# Patient Record
Sex: Male | Born: 1990
Health system: Southern US, Community
[De-identification: ages and names within clinical notes are randomized; demographics above are authoritative.]

## PROBLEM LIST (undated history)

## (undated) DIAGNOSIS — K7460 Unspecified cirrhosis of liver: Secondary | ICD-10-CM

## (undated) DIAGNOSIS — R188 Other ascites: Secondary | ICD-10-CM

## (undated) DIAGNOSIS — R159 Full incontinence of feces: Secondary | ICD-10-CM

## (undated) DIAGNOSIS — B180 Chronic viral hepatitis B with delta-agent: Secondary | ICD-10-CM

## (undated) DIAGNOSIS — K7010 Alcoholic hepatitis without ascites: Secondary | ICD-10-CM

## (undated) DIAGNOSIS — I69311 Memory deficit following cerebral infarction: Secondary | ICD-10-CM

## (undated) DIAGNOSIS — K7290 Hepatic failure, unspecified without coma: Secondary | ICD-10-CM

## (undated) DIAGNOSIS — F329 Major depressive disorder, single episode, unspecified: Secondary | ICD-10-CM

## (undated) DIAGNOSIS — J069 Acute upper respiratory infection, unspecified: Secondary | ICD-10-CM

## (undated) DIAGNOSIS — Z982 Presence of cerebrospinal fluid drainage device: Secondary | ICD-10-CM

## (undated) DIAGNOSIS — N319 Neuromuscular dysfunction of bladder, unspecified: Secondary | ICD-10-CM

## (undated) DIAGNOSIS — R41842 Visuospatial deficit: Secondary | ICD-10-CM

## (undated) DIAGNOSIS — Z978 Presence of other specified devices: Secondary | ICD-10-CM

## (undated) DIAGNOSIS — Z8619 Personal history of other infectious and parasitic diseases: Secondary | ICD-10-CM

## (undated) DIAGNOSIS — G40909 Epilepsy, unspecified, not intractable, without status epilepticus: Secondary | ICD-10-CM

## (undated) DIAGNOSIS — K592 Neurogenic bowel, not elsewhere classified: Secondary | ICD-10-CM

## (undated) DIAGNOSIS — I69314 Frontal lobe and executive function deficit following cerebral infarction: Secondary | ICD-10-CM

## (undated) DIAGNOSIS — L989 Disorder of the skin and subcutaneous tissue, unspecified: Secondary | ICD-10-CM

## (undated) DIAGNOSIS — B451 Cerebral cryptococcosis: Secondary | ICD-10-CM

## (undated) DIAGNOSIS — B182 Chronic viral hepatitis C: Secondary | ICD-10-CM

## (undated) DIAGNOSIS — Z8673 Personal history of transient ischemic attack (TIA), and cerebral infarction without residual deficits: Secondary | ICD-10-CM

## (undated) DIAGNOSIS — N179 Acute kidney failure, unspecified: Secondary | ICD-10-CM

## (undated) DIAGNOSIS — F32A Depression, unspecified: Secondary | ICD-10-CM

## (undated) DIAGNOSIS — Z960 Presence of urogenital implants: Secondary | ICD-10-CM

## (undated) DIAGNOSIS — G709 Myoneural disorder, unspecified: Secondary | ICD-10-CM

## (undated) DIAGNOSIS — R41841 Cognitive communication deficit: Secondary | ICD-10-CM

## (undated) DIAGNOSIS — R112 Nausea with vomiting, unspecified: Secondary | ICD-10-CM

## (undated) DIAGNOSIS — I6931 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: Secondary | ICD-10-CM

## (undated) DIAGNOSIS — F319 Bipolar disorder, unspecified: Secondary | ICD-10-CM

## (undated) DIAGNOSIS — R4689 Other symptoms and signs involving appearance and behavior: Secondary | ICD-10-CM

## (undated) DIAGNOSIS — B20 Human immunodeficiency virus [HIV] disease: Secondary | ICD-10-CM

## (undated) DIAGNOSIS — F419 Anxiety disorder, unspecified: Secondary | ICD-10-CM

## (undated) DIAGNOSIS — G40901 Epilepsy, unspecified, not intractable, with status epilepticus: Secondary | ICD-10-CM

## (undated) HISTORY — DX: Major depressive disorder, single episode, unspecified: F32.9

## (undated) HISTORY — DX: Other ascites: R18.8

## (undated) HISTORY — DX: Anxiety disorder, unspecified: F41.9

## (undated) HISTORY — PX: INCISION AND DRAINAGE PERIRECTAL ABSCESS: SHX1804

## (undated) HISTORY — DX: Full incontinence of feces: R15.9

## (undated) HISTORY — DX: Alcoholic hepatitis without ascites: K70.10

## (undated) HISTORY — DX: Acute upper respiratory infection, unspecified: J06.9

## (undated) HISTORY — DX: Depression, unspecified: F32.A

## (undated) HISTORY — DX: Other ascites: K74.60

## (undated) HISTORY — DX: Nausea with vomiting, unspecified: R11.2

## (undated) HISTORY — DX: Myoneural disorder, unspecified: G70.9

## (undated) HISTORY — DX: Chronic viral hepatitis C: B18.2

---
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
## 2010-05-23 ENCOUNTER — Emergency Department (HOSPITAL_COMMUNITY): Admission: EM | Admit: 2010-05-23 | Discharge: 2010-05-24 | Payer: Self-pay | Admitting: Emergency Medicine

## 2010-08-27 ENCOUNTER — Inpatient Hospital Stay (HOSPITAL_COMMUNITY): Admission: EM | Admit: 2010-08-27 | Discharge: 2010-08-29 | Payer: Self-pay | Admitting: Emergency Medicine

## 2011-03-09 LAB — CBC
HCT: 35.3 % — ABNORMAL LOW (ref 39.0–52.0)
Hemoglobin: 11.9 g/dL — ABNORMAL LOW (ref 13.0–17.0)
MCH: 29.5 pg (ref 26.0–34.0)
MCHC: 33.7 g/dL (ref 30.0–36.0)
MCV: 87.4 fL (ref 78.0–100.0)
Platelets: 318 10*3/uL (ref 150–400)
RBC: 4.04 MIL/uL — ABNORMAL LOW (ref 4.22–5.81)
RDW: 12.4 % (ref 11.5–15.5)
WBC: 9.1 10*3/uL (ref 4.0–10.5)

## 2011-03-09 LAB — URINALYSIS, ROUTINE W REFLEX MICROSCOPIC
Bilirubin Urine: NEGATIVE
Glucose, UA: NEGATIVE mg/dL
Hgb urine dipstick: NEGATIVE
Ketones, ur: NEGATIVE mg/dL
Nitrite: NEGATIVE
Protein, ur: NEGATIVE mg/dL
Specific Gravity, Urine: 1.015 (ref 1.005–1.030)
Urobilinogen, UA: 2 mg/dL — ABNORMAL HIGH (ref 0.0–1.0)
pH: 6.5 (ref 5.0–8.0)

## 2011-03-09 LAB — DIFFERENTIAL
Basophils Absolute: 0 10*3/uL (ref 0.0–0.1)
Basophils Relative: 0 % (ref 0–1)
Eosinophils Absolute: 0.1 10*3/uL (ref 0.0–0.7)
Eosinophils Relative: 2 % (ref 0–5)
Lymphocytes Relative: 29 % (ref 12–46)
Lymphs Abs: 2.7 10*3/uL (ref 0.7–4.0)
Monocytes Absolute: 0.6 10*3/uL (ref 0.1–1.0)
Monocytes Relative: 7 % (ref 3–12)
Neutro Abs: 5.6 10*3/uL (ref 1.7–7.7)
Neutrophils Relative %: 62 % (ref 43–77)

## 2011-03-09 LAB — POCT I-STAT, CHEM 8
BUN: 6 mg/dL (ref 6–23)
Calcium, Ion: 1.19 mmol/L (ref 1.12–1.32)
Chloride: 101 mEq/L (ref 96–112)
Creatinine, Ser: 0.9 mg/dL (ref 0.4–1.5)
Glucose, Bld: 97 mg/dL (ref 70–99)
HCT: 39 % (ref 39.0–52.0)
Hemoglobin: 13.3 g/dL (ref 13.0–17.0)
Potassium: 3.3 mEq/L — ABNORMAL LOW (ref 3.5–5.1)
Sodium: 140 mEq/L (ref 135–145)
TCO2: 29 mmol/L (ref 0–100)

## 2011-03-09 LAB — ANAEROBIC CULTURE

## 2011-03-09 LAB — CULTURE, ROUTINE-ABSCESS: Culture: NORMAL

## 2011-03-09 LAB — HEMOCCULT GUIAC POC 1CARD (OFFICE): Fecal Occult Bld: NEGATIVE

## 2011-03-13 LAB — RAPID STREP SCREEN (MED CTR MEBANE ONLY): Streptococcus, Group A Screen (Direct): NEGATIVE

## 2011-03-29 LAB — RPR TITER: RPR Titer: 1:64 {titer} — AB

## 2011-03-29 LAB — T.PALLIDUM AB, IGG: T pallidum Antibodies (TP-PA): 20.6 IV — ABNORMAL HIGH (ref <1.0)

## 2011-03-29 LAB — GC/CHLAMYDIA PROBE AMP, GENITAL
Chlamydia, DNA Probe: NEGATIVE
GC Probe Amp, Genital: NEGATIVE

## 2011-03-29 LAB — RPR: RPR Ser Ql: REACTIVE — AB

## 2011-04-03 LAB — CBC
HCT: 43.4 % (ref 39.0–52.0)
Hemoglobin: 14.9 g/dL (ref 13.0–17.0)
MCHC: 34.5 g/dL (ref 30.0–36.0)
MCV: 87.8 fL (ref 78.0–100.0)
Platelets: 167 10*3/uL (ref 150–400)
RBC: 4.94 MIL/uL (ref 4.22–5.81)
RDW: 12.1 % (ref 11.5–15.5)
WBC: 2.9 10*3/uL — ABNORMAL LOW (ref 4.0–10.5)

## 2011-04-03 LAB — COMPREHENSIVE METABOLIC PANEL
ALT: 55 U/L — ABNORMAL HIGH (ref 0–53)
AST: 59 U/L — ABNORMAL HIGH (ref 0–37)
Albumin: 3.4 g/dL — ABNORMAL LOW (ref 3.5–5.2)
Alkaline Phosphatase: 68 U/L (ref 39–117)
BUN: 8 mg/dL (ref 6–23)
CO2: 26 mEq/L (ref 19–32)
Calcium: 8.8 mg/dL (ref 8.4–10.5)
Chloride: 103 mEq/L (ref 96–112)
Creatinine, Ser: 0.99 mg/dL (ref 0.4–1.5)
GFR calc Af Amer: >60 mL/min (ref >60)
GFR calc non Af Amer: >60 mL/min (ref >60)
Glucose, Bld: 89 mg/dL (ref 70–99)
Potassium: 3.1 mEq/L — ABNORMAL LOW (ref 3.5–5.1)
Sodium: 137 mEq/L (ref 135–145)
Total Bilirubin: 0.8 mg/dL (ref 0.3–1.2)
Total Protein: 7.3 g/dL (ref 6.0–8.3)

## 2011-04-03 LAB — DIFFERENTIAL
Basophils Absolute: 0 10*3/uL (ref 0.0–0.1)
Basophils Relative: 1 % (ref 0–1)
Eosinophils Absolute: 0 10*3/uL (ref 0.0–0.7)
Eosinophils Relative: 0 % (ref 0–5)
Lymphocytes Relative: 41 % (ref 12–46)
Lymphs Abs: 1.2 10*3/uL (ref 0.7–4.0)
Monocytes Absolute: 0.4 10*3/uL (ref 0.1–1.0)
Monocytes Relative: 13 % — ABNORMAL HIGH (ref 3–12)
Neutro Abs: 1.3 10*3/uL — ABNORMAL LOW (ref 1.7–7.7)
Neutrophils Relative %: 45 % (ref 43–77)

## 2011-04-03 LAB — URINALYSIS, ROUTINE W REFLEX MICROSCOPIC
Bilirubin Urine: NEGATIVE
Glucose, UA: NEGATIVE mg/dL
Hgb urine dipstick: NEGATIVE
Ketones, ur: NEGATIVE mg/dL
Nitrite: NEGATIVE
Protein, ur: NEGATIVE mg/dL
Specific Gravity, Urine: 1.017 (ref 1.005–1.030)
Urobilinogen, UA: 4 mg/dL — ABNORMAL HIGH (ref 0.0–1.0)
pH: 7 (ref 5.0–8.0)

## 2011-09-18 LAB — INFLUENZA A AND B ANTIGEN (CONVERTED LAB)
Inflenza A Ag: NEGATIVE
Influenza B Ag: NEGATIVE

## 2011-11-25 ENCOUNTER — Emergency Department (HOSPITAL_COMMUNITY)
Admission: EM | Admit: 2011-11-25 | Discharge: 2011-11-26 | Disposition: A | Discharge status: Home / Self Care | Payer: Self-pay | Attending: Emergency Medicine | Admitting: Emergency Medicine

## 2011-11-25 ENCOUNTER — Encounter: Payer: Self-pay | Admitting: Emergency Medicine

## 2011-11-25 DIAGNOSIS — K921 Melena: Secondary | ICD-10-CM | POA: Insufficient documentation

## 2011-11-25 DIAGNOSIS — R198 Other specified symptoms and signs involving the digestive system and abdomen: Secondary | ICD-10-CM | POA: Insufficient documentation

## 2011-11-25 DIAGNOSIS — K6289 Other specified diseases of anus and rectum: Secondary | ICD-10-CM | POA: Insufficient documentation

## 2011-11-25 MED ORDER — CEFTRIAXONE SODIUM 250 MG IJ SOLR
250.0000 mg | Freq: Once | INTRAMUSCULAR | Status: AC
  Administered 2011-11-26: 250 mg via INTRAMUSCULAR
  Filled 2011-11-25: qty 250

## 2011-11-25 MED ORDER — AZITHROMYCIN 250 MG PO TABS
1000.0000 mg | ORAL_TABLET | Freq: Once | ORAL | Status: AC
  Administered 2011-11-26: 1000 mg via ORAL
  Filled 2011-11-25: qty 4

## 2011-11-25 NOTE — ED Notes (Signed)
Per pt has Hx of rectal abscess, last year, ever since then noted white stains on the underwear, blood, at time moderate amount, and stool. Denies pain states is more discomfort.

## 2011-11-25 NOTE — ED Provider Notes (Signed)
History     CSN: [id number] Arrival date & time: 11/25/2011  8:09 PM   None     Chief Complaint  Patient presents with  . Rectal Problems    Hx of rectal abscess    (Consider location/radiation/quality/duration/timing/severity/associated sxs/prior treatment) Patient is a 20 y.o. male presenting with hematochezia. The history is provided by the patient.  Rectal Bleeding  The problem occurs frequently. The problem has been unchanged (He reports drainage of perirectal abscess over one year ago. A short time after that he started having recurrent purulent drainage without swelling or significant pain. He sees some bleeding as well. ). The pain is mild. The stool is described as soft. Pertinent negatives include no fever, no abdominal pain and no nausea. Associated symptoms comments: He denies any change in symptoms to prompt visit, only that symptoms persist and he wanted evaluation. He reports regular, easy bowel movements..    History reviewed. No pertinent past medical history.  Past Surgical History  Procedure Date  . Rectal surgery     drainage of abscess    History reviewed. No pertinent family history.  History  Substance Use Topics  . Smoking status: Current Everyday Smoker  . Smokeless tobacco: Not on file  . Alcohol Use: No      Review of Systems  Constitutional: Negative for fever and chills.  HENT: Negative.   Gastrointestinal: Positive for hematochezia. Negative for nausea and abdominal pain.       See HPI.  Skin: Negative.   Neurological: Negative.     Allergies  Review of patient's allergies indicates no known allergies.  Home Medications   Current Outpatient Rx  Name Route Sig Dispense Refill  . PSEUDOEPH-DOXYLAMINE-DM-APAP 60-7.05-23-999 MG/30ML PO LIQD Oral Take 10 mLs by mouth as needed. cough       BP 112/63  Pulse 101  Temp(Src) 98.7 F (37.1 C) (Oral)  Resp 18  SpO2 100%  Physical Exam  Constitutional: He is oriented to person,  place, and time. He appears well-developed and well-nourished.  Neck: Normal range of motion.  Pulmonary/Chest: Effort normal.  Genitourinary:       Purulent discharge around anus. No palpable abscess perirectally. No hemorrhoids observed. Anus is tender. No bleeding.  Musculoskeletal: Normal range of motion.  Neurological: He is alert and oriented to person, place, and time.  Skin: Skin is warm and dry.  Psychiatric: He has a normal mood and affect.    ED Course  Procedures (including critical care time)  Labs Reviewed - No data to display No results found.   No diagnosis found.    MDM          Rodena Medin, PA 12/04/11 [phone]

## 2011-11-26 NOTE — ED Notes (Signed)
Pt reports persistent  Rectal discharge

## 2011-12-04 NOTE — ED Provider Notes (Signed)
Medical screening examination/treatment/procedure(s) were conducted as a shared visit with non-physician practitioner(s) and myself.  I personally evaluated the patient during the encounter.  Patient has had rectal drainage for one year. No new symptoms. No fever or chills. Refer to general surgery  Donnetta Hutching, MD 12/04/11 [phone]

## 2011-12-26 DIAGNOSIS — Z8619 Personal history of other infectious and parasitic diseases: Secondary | ICD-10-CM

## 2011-12-26 HISTORY — DX: Personal history of other infectious and parasitic diseases: Z86.19

## 2012-04-11 ENCOUNTER — Emergency Department (HOSPITAL_COMMUNITY)
Admission: EM | Admit: 2012-04-11 | Discharge: 2012-04-11 | Disposition: A | Discharge status: Home / Self Care | Payer: Self-pay | Attending: Emergency Medicine | Admitting: Emergency Medicine

## 2012-04-11 ENCOUNTER — Encounter (HOSPITAL_COMMUNITY): Payer: Self-pay | Admitting: *Deleted

## 2012-04-11 DIAGNOSIS — K029 Dental caries, unspecified: Secondary | ICD-10-CM | POA: Insufficient documentation

## 2012-04-11 DIAGNOSIS — F17200 Nicotine dependence, unspecified, uncomplicated: Secondary | ICD-10-CM | POA: Insufficient documentation

## 2012-04-11 DIAGNOSIS — K0889 Other specified disorders of teeth and supporting structures: Secondary | ICD-10-CM

## 2012-04-11 DIAGNOSIS — R599 Enlarged lymph nodes, unspecified: Secondary | ICD-10-CM | POA: Insufficient documentation

## 2012-04-11 DIAGNOSIS — R220 Localized swelling, mass and lump, head: Secondary | ICD-10-CM | POA: Insufficient documentation

## 2012-04-11 DIAGNOSIS — K0510 Chronic gingivitis, plaque induced: Secondary | ICD-10-CM | POA: Insufficient documentation

## 2012-04-11 DIAGNOSIS — R221 Localized swelling, mass and lump, neck: Secondary | ICD-10-CM | POA: Insufficient documentation

## 2012-04-11 DIAGNOSIS — K089 Disorder of teeth and supporting structures, unspecified: Secondary | ICD-10-CM | POA: Insufficient documentation

## 2012-04-11 MED ORDER — OXYCODONE-ACETAMINOPHEN 5-325 MG PO TABS: 1.0000 | ORAL_TABLET | ORAL | Status: AC | PRN

## 2012-04-11 MED ORDER — PENICILLIN V POTASSIUM 500 MG PO TABS
500.0000 mg | ORAL_TABLET | Freq: Once | ORAL | Status: AC
  Administered 2012-04-11: 500 mg via ORAL
  Filled 2012-04-11: qty 1

## 2012-04-11 MED ORDER — OXYCODONE-ACETAMINOPHEN 5-325 MG PO TABS
1.0000 | ORAL_TABLET | Freq: Once | ORAL | Status: AC
  Administered 2012-04-11: 1 via ORAL
  Filled 2012-04-11: qty 1

## 2012-04-11 MED ORDER — PENICILLIN V POTASSIUM 500 MG PO TABS: 500.0000 mg | ORAL_TABLET | Freq: Four times a day (QID) | ORAL | Status: AC

## 2012-04-11 NOTE — ED Provider Notes (Signed)
History     CSN: [id number]  Arrival date & time 04/11/12  1425   First MD Initiated Contact with Patient 04/11/12 1546      Chief Complaint  Patient presents with  . Dental Pain    pt states "gums have been swollen x's 1 week" pt reports his "medicaid is pending and the dentist won't accept me."     (Consider location/radiation/quality/duration/timing/severity/associated sxs/prior treatment) Patient is a 21 y.o. male presenting with tooth pain. The history is provided by the patient and a relative.  Dental PainThe primary symptoms include mouth pain. Primary symptoms do not include dental injury or fever. The symptoms began 3 to 5 days ago. The symptoms are worsening. The symptoms occur constantly.  Additional symptoms include: facial swelling. Additional symptoms do not include: trouble swallowing and ear pain. Associated symptoms comments: Known dental decay that is now painful and associated with facial swelling. No drainage into mouth. No fever, N, V..    History reviewed. No pertinent past medical history.  Past Surgical History  Procedure Date  . Rectal surgery     drainage of abscess    History reviewed. No pertinent family history.  History  Substance Use Topics  . Smoking status: Current Everyday Smoker    Types: Cigarettes  . Smokeless tobacco: Not on file  . Alcohol Use: No      Review of Systems  Constitutional: Negative for fever and chills.  HENT: Positive for facial swelling. Negative for ear pain and trouble swallowing.        Toothache.  Gastrointestinal: Negative.  Negative for nausea and vomiting.    Allergies  Review of patient's allergies indicates no known allergies.  Home Medications   Current Outpatient Rx  Name Route Sig Dispense Refill  . PSEUDOEPH-DOXYLAMINE-DM-APAP 60-7.05-23-999 MG/30ML PO LIQD Oral Take 10 mLs by mouth as needed. cough       BP 165/98  Pulse 119  Temp(Src) 98.8 F (37.1 C) (Oral)  Resp 20  Ht 6\' 1"  (1.854  m)  Wt 160 lb (72.576 kg)  BMI 21.11 kg/m2  SpO2 98%  Physical Exam  Constitutional: He is oriented to person, place, and time. He appears well-developed and well-nourished. No distress.  HENT:       Generally good dentition with gingival redness and swelling along lower left ridge wtihout discrete pointing abscess. Stable dentition.  Neck: Normal range of motion.  Pulmonary/Chest: Effort normal.  Musculoskeletal: Normal range of motion.  Lymphadenopathy:    He has cervical adenopathy.  Neurological: He is alert and oriented to person, place, and time.  Skin: Skin is warm and dry.  Psychiatric: He has a normal mood and affect.    ED Course  Procedures (including critical care time)  Labs Reviewed - No data to display No results found.   No diagnosis found. 1. Dental pain 2. Gingivitis   MDM  Suspect underlying dental infection with facial swelling and submandibular adenopathy. Will put on PenVK, and give pain medication.         Rodena Medin, PA-C 04/11/12 [phone]

## 2012-04-11 NOTE — Discharge Instructions (Signed)
Dental Pain A tooth ache may be caused by cavities (tooth decay). Cavities expose the nerve of the tooth to air and hot or cold temperatures. It may come from an infection or abscess (also called a boil or furuncle) around your tooth. It is also often caused by dental caries (tooth decay). This causes the pain you are having. DIAGNOSIS  Your caregiver can diagnose this problem by exam. TREATMENT   If caused by an infection, it may be treated with medications which kill germs (antibiotics) and pain medications as prescribed by your caregiver. Take medications as directed.   Only take over-the-counter or prescription medicines for pain, discomfort, or fever as directed by your caregiver.   Whether the tooth ache today is caused by infection or dental disease, you should see your dentist as soon as possible for further care.  SEEK MEDICAL CARE IF: The exam and treatment you received today has been provided on an emergency basis only. This is not a substitute for complete medical or dental care. If your problem worsens or new problems (symptoms) appear, and you are unable to meet with your dentist, call or return to this location. SEEK IMMEDIATE MEDICAL CARE IF:   You have a fever.   You develop redness and swelling of your face, jaw, or neck.   You are unable to open your mouth.   You have severe pain uncontrolled by pain medicine.  MAKE SURE YOU:   Understand these instructions.   Will watch your condition.   Will get help right away if you are not doing well or get worse.  Document Released: 12/11/2005 Document Revised: 11/30/2011 Document Reviewed: 07/29/2008 ExitCare Patient Information 2012 [employer]  RESOURCE GUIDE  Dental Problems  Patients with Medicaid: [facility]                     [facility] [address]                                           [address] Phone:  [phone]                                                   Phone:  [phone]  If unable to pay or uninsured, contact:  [facility] or [facility] to become qualified for the adult dental clinic.  Chronic Pain Problems Contact [facility]  [phone] Patients need to be referred by their primary care doctor.  Insufficient Money for Medicine Contact [employer]:  call "211" or [facility] 271-5999.  No Primary Care Doctor Call [facility]  832-8000 Other agencies that provide inexpensive medical care    [facility]  832-8035    [facility]  [phone]    [facility]  [phone]    [facility] Clinic  [phone]    [facility]  [phone]    [facility]  [phone]  Psychological Services [facility]  [phone] [facility]  [phone] [facility]   800 853-5163 (emergency services 641-4993)  Substance Abuse Resources [facility]  336-882-2125 [facility] 336-784-9470 [facility] 336-285-9073 [facility]   336-845-3988 Residential & Outpatient Substance Abuse Program  800-659-3381  Abuse/Neglect [place] Child Abuse Hotline (336) 641-3795 [place] Child Abuse Hotline 800-378-5315 (After Hours)  Emergency Shelter [facility] (336) 271-5985  [facility] (336) 275-9566 [facility] (704) 372-4663  MRSA Hotline #:   832-7006    [place] [facility]  [facility] [place]     [employer]                          [facility] [address] [place]                       [address]      [address]  [place]                                                [place]                            [place] Phone:  [phone]                                   Phone:  [phone]                 Phone:  [phone]  [facility] Phone:   [phone]  [place] Child Abuse Hotline (336) 342-1394 (336) 342-3537 (After Hours)   

## 2012-04-11 NOTE — ED Provider Notes (Signed)
Medical screening examination/treatment/procedure(s) were performed by non-physician practitioner and as supervising physician I was immediately available for consultation/collaboration.  Kierstyn Baranowski, MD 04/11/12 2006 

## 2012-08-19 ENCOUNTER — Encounter (HOSPITAL_COMMUNITY): Payer: Self-pay | Admitting: Emergency Medicine

## 2012-08-19 ENCOUNTER — Emergency Department (HOSPITAL_COMMUNITY)
Admission: EM | Admit: 2012-08-19 | Discharge: 2012-08-19 | Disposition: A | Discharge status: Home / Self Care | Payer: Self-pay | Attending: Emergency Medicine | Admitting: Emergency Medicine

## 2012-08-19 DIAGNOSIS — R634 Abnormal weight loss: Secondary | ICD-10-CM | POA: Insufficient documentation

## 2012-08-19 DIAGNOSIS — W57XXXA Bitten or stung by nonvenomous insect and other nonvenomous arthropods, initial encounter: Secondary | ICD-10-CM

## 2012-08-19 DIAGNOSIS — R198 Other specified symptoms and signs involving the digestive system and abdomen: Secondary | ICD-10-CM | POA: Insufficient documentation

## 2012-08-19 DIAGNOSIS — Z113 Encounter for screening for infections with a predominantly sexual mode of transmission: Secondary | ICD-10-CM | POA: Insufficient documentation

## 2012-08-19 DIAGNOSIS — R21 Rash and other nonspecific skin eruption: Secondary | ICD-10-CM | POA: Insufficient documentation

## 2012-08-19 DIAGNOSIS — F17200 Nicotine dependence, unspecified, uncomplicated: Secondary | ICD-10-CM | POA: Insufficient documentation

## 2012-08-19 MED ORDER — DOXYCYCLINE HYCLATE 100 MG PO CAPS: 100.0000 mg | ORAL_CAPSULE | Freq: Two times a day (BID) | ORAL | Status: AC

## 2012-08-19 MED ORDER — HYDROCORTISONE 1 % EX CREA: TOPICAL_CREAM | Freq: Two times a day (BID) | CUTANEOUS | Status: DC

## 2012-08-19 MED ORDER — CEFTRIAXONE SODIUM 250 MG IJ SOLR
250.0000 mg | Freq: Once | INTRAMUSCULAR | Status: AC
  Administered 2012-08-19: 250 mg via INTRAMUSCULAR
  Filled 2012-08-19: qty 250

## 2012-08-19 MED ORDER — DIPHENHYDRAMINE HCL 25 MG PO TABS: 25.0000 mg | ORAL_TABLET | Freq: Four times a day (QID) | ORAL | Status: DC

## 2012-08-19 NOTE — ED Notes (Signed)
Pt presenting to ed with c/o rash to extremities x 1 month pt states he went to [place] times one month ago and he did not have this rash. Pt states he also has decreased appetite. Pt is alert and oriented at this time pt is in nad

## 2012-08-19 NOTE — ED Notes (Signed)
Pt presenting to ed with c/o rash x 1 month all over extremities and weight loss of 15-20lbs in the past month. Pt states 'I eat but not a lot like I use too". Pt denies pain at this time.

## 2012-08-19 NOTE — ED Provider Notes (Signed)
History     CSN: [id number]  Arrival date & time 08/19/12  0850   First MD Initiated Contact with Patient 08/19/12 [phone]      No chief complaint on file.   (Consider location/radiation/quality/duration/timing/severity/associated sxs/prior treatment) HPI Comments: [name] ambulatory for the evaluation of weight loss.  He states that he went to [place] on vacation last month.  Since returning he reports developing a rash.  He describes recurrent small, raised, itching bumps that turn dark and discolored after scratching.  He denies any fevers, diet changes, NVD, heat or cold intolerance, neck pain/swelling, drug use or abuse, palpitations, or fatigue.  He describes loosing about 15 lbs but remarks that he otherwise feels well.  On follow-up hx he states that he is concerned because he has developed an anal discharge.  He denies any rectal or perirectal pain.  He denies any significant rectal bleeding.  He does state that he engages in rectal intercourse but has experienced no discomfort during sex.  He also denies any penile discharges.  He reports having an HIV test 2 months ago that was negative.  The history is provided by the patient. No language interpreter was used.    No past medical history on file.  Past Surgical History  Procedure Date  . Rectal surgery     drainage of abscess    No family history on file.  History  Substance Use Topics  . Smoking status: Current Everyday Smoker    Types: Cigarettes  . Smokeless tobacco: Not on file  . Alcohol Use: No      Review of Systems  Constitutional: Positive for unexpected weight change. Negative for fever, chills, diaphoresis, activity change, appetite change and fatigue.  HENT: Negative for nosebleeds, congestion, facial swelling, rhinorrhea, neck pain, neck stiffness and voice change.   Eyes: Negative.   Respiratory: Negative.   Cardiovascular: Negative.   Gastrointestinal: Positive for anal bleeding.  Negative for vomiting, abdominal pain, diarrhea, constipation, blood in stool, abdominal distention and rectal pain.       Spotting when wiping  Genitourinary: Negative.   Musculoskeletal: Negative.   Skin: Positive for rash. Negative for color change, pallor and wound.  Neurological: Negative.   Hematological: Negative.   Psychiatric/Behavioral: Negative.     Allergies  Review of patient's allergies indicates no known allergies.  Home Medications  No current outpatient prescriptions on file.  There were no vitals taken for this visit.  Physical Exam  Nursing note and vitals reviewed. Constitutional: He is oriented to person, place, and time. He appears well-developed and well-nourished. No distress.  HENT:  Head: Normocephalic and atraumatic.  Right Ear: External ear normal.  Left Ear: External ear normal.  Nose: Nose normal.  Mouth/Throat: Oropharynx is clear and moist. No oropharyngeal exudate.  Eyes: Conjunctivae and EOM are normal. Pupils are equal, round, and reactive to light. Right eye exhibits no discharge. Left eye exhibits no discharge. No scleral icterus.  Neck: Normal range of motion. Neck supple. No JVD present. No tracheal deviation present. No thyromegaly present.  Cardiovascular: Normal rate, regular rhythm, normal heart sounds and intact distal pulses.  Exam reveals no gallop and no friction rub.   No murmur heard. Pulmonary/Chest: Effort normal and breath sounds normal. No stridor. No respiratory distress. He has no wheezes. He has no rales. He exhibits no tenderness.  Abdominal: Soft. Bowel sounds are normal. He exhibits no distension. There is no tenderness. There is no rebound and no guarding.  Musculoskeletal: Normal  range of motion. He exhibits no edema and no tenderness.  Lymphadenopathy:    He has no cervical adenopathy.  Neurological: He is alert and oriented to person, place, and time. No cranial nerve deficit.  Skin: Skin is dry. Petechiae, purpura  and rash noted. Rash is maculopapular, nodular, pustular, vesicular and urticarial. He is not diaphoretic. No cyanosis or erythema. No pallor. Nails show no clubbing.       Note multiple, darkened maculopapular spots on arms and legs.  Greatest number are on legs.  Many have been excoriated.  No fluctuance or weeping.  No lesions on face palms or soles.    Psychiatric: He has a normal mood and affect. His behavior is normal. Thought content normal.    ED Course  Procedures (including critical care time)  Labs Reviewed - No data to display No results found.   No diagnosis found.    MDM  Pt presents for evaluation of a recent 15lb weight loss and recurrent rash.  He is afebrile and appears nontoxic.  Pt states that he does engage in high risk sexual behaviors.  Although the discharge is painless, will treat at this time for GC and Chlamydia.  The rash appears in a distribution pattern that suggests bug bites.  Enquired as to whether he had a pet that may have fleas or any concerns that his home may have bed bugs.  He lives in a rented 1 family house.  He states that he has never seen any bed bugs.  He did have a dog up till about 1 week ago.  He states the dog slept in bed with him often.  He had never treated the dog for fleas.  Plan symptomatic care.  Encouraged outpt f/u with a PMD.  Advised condom usage also.  Discussed indications for immediate return to the emergency department prior to pt's discharge.        Tobin Chad, MD 08/19/12 [phone]

## 2012-10-07 ENCOUNTER — Telehealth: Payer: Self-pay

## 2012-10-07 NOTE — Telephone Encounter (Signed)
  Laurel Dimmer,  Social Worker for [facility] calling to say patient was a no show with Dr Roxan Hockey on 10-04-12. They will call the patient to reschedule his appointment.  Pt presented with  whole body rash present and STD testing with Nurse  visit and was treated with Rocephin  250, Bicillin  2.4 million units  and metronidazole. 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  Previous history of syphilis.   I have patient scheduled for intake on 10-10-2012.  Laurell Josephs, RN

## 2012-10-10 ENCOUNTER — Encounter: Payer: Self-pay | Admitting: Infectious Disease

## 2012-10-10 ENCOUNTER — Ambulatory Visit: Payer: Self-pay

## 2012-10-10 ENCOUNTER — Ambulatory Visit (INDEPENDENT_AMBULATORY_CARE_PROVIDER_SITE_OTHER): Payer: Self-pay | Admitting: Infectious Disease

## 2012-10-10 VITALS — BP 126/76 | HR 91 | Temp 98.7°F | Ht 72.0 in | Wt 171.8 lb

## 2012-10-10 DIAGNOSIS — B20 Human immunodeficiency virus [HIV] disease: Secondary | ICD-10-CM

## 2012-10-10 DIAGNOSIS — Z21 Asymptomatic human immunodeficiency virus [HIV] infection status: Secondary | ICD-10-CM

## 2012-10-10 DIAGNOSIS — Z113 Encounter for screening for infections with a predominantly sexual mode of transmission: Secondary | ICD-10-CM

## 2012-10-10 DIAGNOSIS — A539 Syphilis, unspecified: Secondary | ICD-10-CM | POA: Insufficient documentation

## 2012-10-10 DIAGNOSIS — R198 Other specified symptoms and signs involving the digestive system and abdomen: Secondary | ICD-10-CM | POA: Insufficient documentation

## 2012-10-10 DIAGNOSIS — B86 Scabies: Secondary | ICD-10-CM

## 2012-10-10 DIAGNOSIS — Z79899 Other long term (current) drug therapy: Secondary | ICD-10-CM

## 2012-10-10 DIAGNOSIS — L22 Diaper dermatitis: Secondary | ICD-10-CM | POA: Insufficient documentation

## 2012-10-10 MED ORDER — PERMETHRIN 5 % EX CREA: TOPICAL_CREAM | CUTANEOUS | Status: DC

## 2012-10-10 MED ORDER — DOXYCYCLINE HYCLATE 100 MG PO TABS: 100.0000 mg | ORAL_TABLET | Freq: Two times a day (BID) | ORAL | Status: DC

## 2012-10-10 NOTE — Progress Notes (Signed)
Subjective:    Patient ID: Justin Ball, male    DOB: 05/16/1991, 21 y.o.   MRN: [id number]  HPI  21 year old [place] American man who is recently been diagnosed with HIV via testing at the health department. His risk factors for HIV include sex with another man including 6 lifetime partners since the age of 70. He does have a history of prior syphilis that was diagnosed at [facility] where unfortunately no HIV testing was performed the time of that diagnosis. He also has been treated previously for scabies infection and again did not have an HIV test. He was recently seen at the [facility] department he was complaining of discharge from his rectum him a pain with defecation and a diffuse rash all over his body since June of 2013. He had been in contact with someone who had had diagnosed syphilis. In the health department the patient was given a dose of Rocephin 250 mg to treat possible gonorrhea as well as 1 g of azithromycin. He also received 2.4 million units of Bicillin for presumptive diagnosis of syphilis. His RPR at the health department was nonreactive although I do not know if it was done to check for a prozone effect. He was being seen by our nurse Derrill Kay for intake for HIV in his labs are being processed today. He continued to have diffuse pruritic rash that have not improved after his dose of penicillin health department. I asked the lab to check titers for syphilis so that we can exclude a prozone effect. As the severity of his rash I decided to go ahead and see the patient is a full clinic visit today although he was here only scheduled for lab and intake. Upon close extensive discussions with the patient he disclosed to me that his cousin had been recently diagnosed with scabies and treated for this. I think ultimately this is the most likely diagnosis however like to give him therapy with permethrin. Unfortunately he currently does not have insurance and is not yet been  enrolled in the AIDS drug assistance program. Therefore will try to see if he can get this medication at the [facility] health department. He does continue to suffer from rectal discharge and also like to place him on doxycycline for the next 21 days  case he has LGV serovar rectal chlamydia.   We discussed possible therapies for his HIV including 3 available single tablet regimens as well as Proteus inhibitor-based regimens and two other integrase based regimens. I spent greater than 60 minutes with the patient including greater than 50% of time in face to face counsel of the patient and in coordination of their care.    Review of Systems  Constitutional: Negative for fever, chills, diaphoresis, activity change, appetite change, fatigue and unexpected weight change.  HENT: Negative for congestion, sore throat, rhinorrhea, sneezing, trouble swallowing and sinus pressure.   Eyes: Negative for photophobia and visual disturbance.  Respiratory: Negative for cough, chest tightness, shortness of breath, wheezing and stridor.   Cardiovascular: Negative for chest pain, palpitations and leg swelling.  Gastrointestinal: Positive for rectal pain. Negative for nausea, vomiting, abdominal pain, diarrhea, constipation, blood in stool, abdominal distention and anal bleeding.  Genitourinary: Negative for dysuria, hematuria, flank pain and difficulty urinating.  Musculoskeletal: Negative for myalgias, back pain, joint swelling, arthralgias and gait problem.  Skin: Positive for rash. Negative for color change, pallor and wound.  Neurological: Negative for dizziness, tremors, weakness and light-headedness.  Hematological: Negative for adenopathy.  Does not bruise/bleed easily.  Psychiatric/Behavioral: Negative for behavioral problems, confusion, disturbed wake/sleep cycle, dysphoric mood, decreased concentration and agitation.       Objective:   Physical Exam  Constitutional: He is oriented to person, place, and  time. He appears well-developed and well-nourished. No distress.  HENT:  Head: Normocephalic and atraumatic.  Mouth/Throat: Oropharynx is clear and moist. No oropharyngeal exudate.  Eyes: Conjunctivae normal and EOM are normal. Pupils are equal, round, and reactive to light. No scleral icterus.  Neck: Normal range of motion. Neck supple. No JVD present.  Cardiovascular: Normal rate, regular rhythm and normal heart sounds.  Exam reveals no gallop and no friction rub.   No murmur heard. Pulmonary/Chest: Effort normal and breath sounds normal. No respiratory distress. He has no wheezes. He has no rales. He exhibits no tenderness.  Abdominal: He exhibits no distension and no mass. There is no tenderness. There is no rebound and no guarding.  Genitourinary: Penis normal.     Musculoskeletal: He exhibits no edema and no tenderness.  Lymphadenopathy:    He has no cervical adenopathy.  Neurological: He is alert and oriented to person, place, and time. He has normal reflexes. He exhibits normal muscle tone. Coordination normal.  Skin: Skin is warm and dry. Rash noted. He is not diaphoretic. No erythema. No pallor.     Psychiatric: He has a normal mood and affect. His behavior is normal. Judgment and thought content normal.          Assessment & Plan:  #1 HIV : Await HIV viral load and CD4 count. He'll need both to be enrolled in the AIDS drug assistance program. Hopefully he has no evidence of systems on his genotype. I've described current single tablet regimens for him that are available emphasized the need for high here as to these regimens which have a lower barrier to resistance. I've also described a Protease inhibitor-based regimens as well as 2 other integrase inhibitor based regimens to him. Is not clear if he needs prophylaxis for pneumocystis or Mycobacterium avium at this point.  #2 diffuse pruritic maculopapular rash with scaling: I think this is likely consistent with scabies  especially given his recent exposure to his nephew's diagnosed and treated successfully for scabies. I'm referring him back to the health department to see if they can give him permethrin. We will also recheck an RPR and check titers for the pro zone effect  #3 rectal discharge: Like to obtain records on labs done at the health department but in the meantime would go ahead and treat him presumptively for possible LGV chlamydia infection of the rectum with 21 days of doxycyline  #4  smoking: Will not work on smoking cessation in future visits

## 2012-10-11 ENCOUNTER — Telehealth: Payer: Self-pay

## 2012-10-11 LAB — HIV-1 RNA ULTRAQUANT REFLEX TO GENTYP+
HIV 1 RNA Quant: 67313 copies/mL — ABNORMAL HIGH (ref <20)
HIV-1 RNA Quant, Log: 4.83 {Log} — ABNORMAL HIGH (ref <1.30)

## 2012-10-11 LAB — CBC WITH DIFFERENTIAL/PLATELET
Basophils Relative: 1 % (ref 0–1)
Eosinophils Absolute: 0.3 10*3/uL (ref 0.0–0.7)
Eosinophils Relative: 13 % — ABNORMAL HIGH (ref 0–5)
MCH: 30 pg (ref 26.0–34.0)
MCHC: 36.5 g/dL — ABNORMAL HIGH (ref 30.0–36.0)
MCV: 82.3 fL (ref 78.0–100.0)
Monocytes Relative: 10 % (ref 3–12)
Neutrophils Relative %: 42 % — ABNORMAL LOW (ref 43–77)
Platelets: 282 10*3/uL (ref 150–400)

## 2012-10-11 LAB — URINALYSIS
Bilirubin Urine: NEGATIVE
Glucose, UA: NEGATIVE mg/dL
Hgb urine dipstick: NEGATIVE
Ketones, ur: NEGATIVE mg/dL
Protein, ur: NEGATIVE mg/dL
pH: 7.5 (ref 5.0–8.0)

## 2012-10-11 LAB — COMPLETE METABOLIC PANEL WITH GFR
Alkaline Phosphatase: 95 U/L (ref 39–117)
CO2: 27 mEq/L (ref 19–32)
Creat: 0.8 mg/dL (ref 0.50–1.35)
GFR, Est African American: >89 mL/min
GFR, Est Non African American: >89 mL/min
Glucose, Bld: 77 mg/dL (ref 70–99)
Sodium: 138 mEq/L (ref 135–145)
Total Bilirubin: 0.7 mg/dL (ref 0.3–1.2)
Total Protein: 8.6 g/dL — ABNORMAL HIGH (ref 6.0–8.3)

## 2012-10-11 LAB — LIPID PANEL
HDL: 30 mg/dL — ABNORMAL LOW (ref >39)
LDL Cholesterol: 95 mg/dL (ref 0–99)
Total CHOL/HDL Ratio: 5.3 Ratio
VLDL: 33 mg/dL (ref 0–40)

## 2012-10-11 LAB — HEPATITIS B SURFACE ANTIGEN: Hepatitis B Surface Ag: POSITIVE — AB

## 2012-10-11 LAB — HEPATITIS B CORE ANTIBODY, TOTAL: Hep B Core Total Ab: POSITIVE — AB

## 2012-10-11 LAB — HEPATITIS B SURFACE ANTIBODY,QUALITATIVE: Hep B S Ab: NEGATIVE

## 2012-10-11 NOTE — Telephone Encounter (Signed)
Health Department was able to help patient with his  medication for scabies.  They are not able to fill the doxycycline since he was treated with Bicillin and rocephin for possible syphilis exposure.   Patient may have it filled at a local [facility].  Laurell Josephs, RN

## 2012-10-11 NOTE — Progress Notes (Signed)
Pt is covered in bumps . Upper and lower body and extremities.  Bilateral thighs are worse than other areas.  He is having constant itiching.  Pt was treated for syphilis at [facility] on week and one day ago.  He was also given Rocephin.  His RPR was non reactive per the Health Dept.   Pt is not able to stop scratching.  I will ask Dr [name] to see patient today at intake visit due to the severity of his itching and rash.  Pt states he was treated for scabies 1 month ago by the emergency room.    Dr [name] will see patient.    Laurell Josephs, RN

## 2012-10-14 ENCOUNTER — Telehealth: Payer: Self-pay

## 2012-10-14 NOTE — Telephone Encounter (Signed)
Pt informed his labs have returned and he will need a visit this week with Dr [name] instead of next week.  Appointment given for 10-16-12 @  8:45 am.   Laurell Josephs, RN

## 2012-10-16 ENCOUNTER — Telehealth: Payer: Self-pay | Admitting: *Deleted

## 2012-10-16 ENCOUNTER — Ambulatory Visit (INDEPENDENT_AMBULATORY_CARE_PROVIDER_SITE_OTHER): Payer: Self-pay | Admitting: Infectious Disease

## 2012-10-16 ENCOUNTER — Encounter: Payer: Self-pay | Admitting: Infectious Disease

## 2012-10-16 VITALS — BP 124/81 | HR 89 | Temp 98.1°F | Ht 73.0 in | Wt 172.1 lb

## 2012-10-16 DIAGNOSIS — Z23 Encounter for immunization: Secondary | ICD-10-CM

## 2012-10-16 DIAGNOSIS — R198 Other specified symptoms and signs involving the digestive system and abdomen: Secondary | ICD-10-CM

## 2012-10-16 DIAGNOSIS — B86 Scabies: Secondary | ICD-10-CM

## 2012-10-16 DIAGNOSIS — B20 Human immunodeficiency virus [HIV] disease: Secondary | ICD-10-CM

## 2012-10-16 DIAGNOSIS — Z21 Asymptomatic human immunodeficiency virus [HIV] infection status: Secondary | ICD-10-CM

## 2012-10-16 LAB — HIV-1 GENOTYPR PLUS

## 2012-10-16 MED ORDER — PERMETHRIN 5 % EX CREA: TOPICAL_CREAM | CUTANEOUS | Status: DC

## 2012-10-16 MED ORDER — DOLUTEGRAVIR SODIUM 50 MG PO TABS: 50.0000 mg | ORAL_TABLET | Freq: Every day | ORAL | Status: DC

## 2012-10-16 MED ORDER — AZITHROMYCIN 600 MG PO TABS: 1200.0000 mg | ORAL_TABLET | ORAL | Status: DC

## 2012-10-16 MED ORDER — EMTRICITABINE-TENOFOVIR DF 200-300 MG PO TABS: 1.0000 | ORAL_TABLET | Freq: Every day | ORAL | Status: DC

## 2012-10-16 MED ORDER — SULFAMETHOXAZOLE-TMP DS 800-160 MG PO TABS: 1.0000 | ORAL_TABLET | Freq: Every day | ORAL | Status: DC

## 2012-10-16 MED ORDER — DOXYCYCLINE HYCLATE 100 MG PO TABS: 100.0000 mg | ORAL_TABLET | Freq: Two times a day (BID) | ORAL | Status: DC

## 2012-10-16 NOTE — Patient Instructions (Addendum)
Your Regimen is:  Tivicay one tablet daily  Truvada one tablet daily  Please also take   Bactrim DS one tablet daily to prevent PCP pneumonia  And  Azithromycin two 600mg  tablets = 1200mg  once weekly to prevent MAC infection  Your permethrin will also be covered by ADAP

## 2012-10-16 NOTE — Progress Notes (Signed)
Subjective:    Patient ID: Justin Ball, male    DOB: [birth year], 21 y.o.   MRN: [id number]  HPI  21 year old [place] American man with HIV and AIDS recently diagnosed. I signed the clinic approximately a week and half ago which time we do not yet have his viral load and CD4 counts we cannot yet start antiretroviral therapy via the AIDS drug assistance program. At that time he did have evidence of scabies and I tried to prescribe him permethrin. He went to the health department but would not give him his medication for the scabies. He is also having rectal pain and we've decided to try to treat him empirically for possible Chlamydia infection with doxycycline. His rectal pain has improved he does have persistence of the scabies since it is gone treated.  We discussed various antiretroviral regimens and I came up with Tivicay and Truvada for him he was a Bactrim for PCP prophylaxis and azithromycin for Mycobacterium avium prophylaxis. We will apply for emergency a ADAP approval. I spent greater than 45 minutes with the patient including greater than 50% of time in face to face counsel of the patient and in coordination of their care.  Review of Systems  Constitutional: Negative for fever, chills, diaphoresis, activity change, appetite change, fatigue and unexpected weight change.  HENT: Negative for congestion, sore throat, rhinorrhea, sneezing, trouble swallowing and sinus pressure.   Eyes: Negative for photophobia and visual disturbance.  Respiratory: Negative for cough, chest tightness, shortness of breath, wheezing and stridor.   Cardiovascular: Negative for chest pain, palpitations and leg swelling.  Gastrointestinal: Positive for rectal pain. Negative for nausea, vomiting, abdominal pain, diarrhea, constipation, blood in stool, abdominal distention and anal bleeding.  Genitourinary: Negative for dysuria, hematuria, flank pain and difficulty urinating.  Musculoskeletal: Negative for myalgias,  back pain, joint swelling, arthralgias and gait problem.  Skin: Positive for rash. Negative for color change, pallor and wound.  Neurological: Negative for dizziness, tremors, weakness and light-headedness.  Hematological: Negative for adenopathy. Does not bruise/bleed easily.  Psychiatric/Behavioral: Negative for behavioral problems, confusion, disturbed wake/sleep cycle, dysphoric mood, decreased concentration and agitation.       Objective:   Physical Exam  Constitutional: He is oriented to person, place, and time. He appears well-developed and well-nourished. No distress.  HENT:  Head: Normocephalic and atraumatic.  Mouth/Throat: Oropharynx is clear and moist. No oropharyngeal exudate.  Eyes: Conjunctivae normal and EOM are normal. Pupils are equal, round, and reactive to light. No scleral icterus.  Neck: Normal range of motion. Neck supple. No JVD present.  Cardiovascular: Normal rate, regular rhythm and normal heart sounds.  Exam reveals no gallop and no friction rub.   No murmur heard. Pulmonary/Chest: Effort normal and breath sounds normal. No respiratory distress. He has no wheezes. He has no rales. He exhibits no tenderness.  Abdominal: He exhibits no distension and no mass. There is no tenderness. There is no rebound and no guarding.  Musculoskeletal: He exhibits no edema and no tenderness.  Lymphadenopathy:    He has no cervical adenopathy.  Neurological: He is alert and oriented to person, place, and time. He has normal reflexes. He exhibits normal muscle tone. Coordination normal.  Skin: Skin is warm and dry. Rash noted. He is not diaphoretic. There is erythema. No pallor.     Psychiatric: He has a normal mood and affect. His behavior is normal. Judgment and thought content normal.          Assessment & Plan:  #  1 HIV and AIDS: Start Truvada and tIVICAY, ALT HIV genotype, or a Bactrim for PCP prophylaxis start azithromycin Kathlene November for Mycobacterium avium prophylaxis given  influenza vaccination give pneumococcal vaccination hepatitis A vaccination.  #2 scabies fill prescription for permethrin adap can cover this.   #3 rectal pain finish doxycycline prescription.

## 2012-10-16 NOTE — Telephone Encounter (Signed)
Patient called to see if his ADAP has come through as he is anxious to start his medication. Advised him we have sent the information they need and are just waiting to hear something but that as soon as we do we will give him a call. He also wanted the phone number for [facility] gave him [phone] he heard about them in the community and wanted to ask them a couple of questions.

## 2012-10-17 ENCOUNTER — Other Ambulatory Visit: Payer: Self-pay | Admitting: Licensed Clinical Social Worker

## 2012-10-17 ENCOUNTER — Telehealth: Payer: Self-pay | Admitting: Licensed Clinical Social Worker

## 2012-10-17 DIAGNOSIS — B20 Human immunodeficiency virus [HIV] disease: Secondary | ICD-10-CM

## 2012-10-17 DIAGNOSIS — B86 Scabies: Secondary | ICD-10-CM

## 2012-10-17 DIAGNOSIS — R198 Other specified symptoms and signs involving the digestive system and abdomen: Secondary | ICD-10-CM

## 2012-10-17 MED ORDER — AZITHROMYCIN 600 MG PO TABS: 1200.0000 mg | ORAL_TABLET | ORAL | Status: DC

## 2012-10-17 MED ORDER — PERMETHRIN 5 % EX CREA: TOPICAL_CREAM | CUTANEOUS | Status: DC

## 2012-10-17 MED ORDER — DOXYCYCLINE HYCLATE 100 MG PO TABS: 100.0000 mg | ORAL_TABLET | Freq: Two times a day (BID) | ORAL | Status: DC

## 2012-10-17 MED ORDER — EMTRICITABINE-TENOFOVIR DF 200-300 MG PO TABS: 1.0000 | ORAL_TABLET | Freq: Every day | ORAL | Status: DC

## 2012-10-17 MED ORDER — SULFAMETHOXAZOLE-TMP DS 800-160 MG PO TABS: 1.0000 | ORAL_TABLET | Freq: Every day | ORAL | Status: DC

## 2012-10-17 MED ORDER — DOLUTEGRAVIR SODIUM 50 MG PO TABS: 50.0000 mg | ORAL_TABLET | Freq: Every day | ORAL | Status: DC

## 2012-10-17 NOTE — Telephone Encounter (Signed)
Patient was approved for ADAP and his medications were sent electronically to [facility] on [address]. I left a message on the cell phone number to let him know he can go pick them up. His case number is [id number].

## 2012-10-18 ENCOUNTER — Encounter: Payer: Self-pay | Admitting: *Deleted

## 2012-10-21 NOTE — Telephone Encounter (Signed)
PERFECT.

## 2012-10-24 ENCOUNTER — Ambulatory Visit (INDEPENDENT_AMBULATORY_CARE_PROVIDER_SITE_OTHER): Payer: Self-pay | Admitting: Infectious Disease

## 2012-10-24 ENCOUNTER — Encounter: Payer: Self-pay | Admitting: Infectious Disease

## 2012-10-24 ENCOUNTER — Ambulatory Visit: Payer: Self-pay | Admitting: Infectious Disease

## 2012-10-24 VITALS — BP 108/69 | HR 93 | Temp 98.2°F | Ht 73.0 in | Wt 174.0 lb

## 2012-10-24 DIAGNOSIS — H6190 Disorder of external ear, unspecified, unspecified ear: Secondary | ICD-10-CM

## 2012-10-24 DIAGNOSIS — Z21 Asymptomatic human immunodeficiency virus [HIV] infection status: Secondary | ICD-10-CM

## 2012-10-24 DIAGNOSIS — B20 Human immunodeficiency virus [HIV] disease: Secondary | ICD-10-CM

## 2012-10-24 DIAGNOSIS — B86 Scabies: Secondary | ICD-10-CM

## 2012-10-24 DIAGNOSIS — H9390 Unspecified disorder of ear, unspecified ear: Secondary | ICD-10-CM

## 2012-10-24 DIAGNOSIS — R21 Rash and other nonspecific skin eruption: Secondary | ICD-10-CM

## 2012-10-24 DIAGNOSIS — R635 Abnormal weight gain: Secondary | ICD-10-CM

## 2012-10-24 MED ORDER — TRIAMCINOLONE ACETONIDE 0.5 % EX OINT: TOPICAL_OINTMENT | Freq: Two times a day (BID) | CUTANEOUS | Status: DC

## 2012-10-24 NOTE — Progress Notes (Signed)
Subjective:    Patient ID: Justin Ball, male    DOB: [birth year], 21 y.o.   MRN: [id number]  HPI  21 year old African American with newly diagnosed HIV/AIDS and diffuse macular rash. He has been recently started on tivicay and truvada last week. He was treated presumptively for syphilis at the [facility] without improvement and RPR was negative there and here. We gave him rx for scabies with permethrin but there has been no improvement. He has been incorrectly taking his azithromycin EVERY day and consequently having loose bowel movements. We have corrected this now. He also c/o gaining weight over the past several months but I cannot attribute this to his HIV or rx since he has only been on rx for 1 week. He did have HA and fever last night and this has resolved. He has sore on his ear that has opened up where he has been scratching it. I spent greater than 45 minutes with the patient including greater than 50% of time in face to face counsel of the patient and in coordination of their care.   Review of Systems  Constitutional: Positive for unexpected weight change. Negative for fever, chills, diaphoresis, activity change, appetite change and fatigue.  HENT: Negative for congestion, sore throat, rhinorrhea, sneezing, trouble swallowing and sinus pressure.   Eyes: Negative for photophobia and visual disturbance.  Respiratory: Negative for cough, chest tightness, shortness of breath, wheezing and stridor.   Cardiovascular: Negative for chest pain, palpitations and leg swelling.  Gastrointestinal: Positive for diarrhea. Negative for nausea, vomiting, abdominal pain, constipation, blood in stool, abdominal distention and anal bleeding.  Genitourinary: Negative for dysuria, hematuria, flank pain and difficulty urinating.  Musculoskeletal: Negative for myalgias, back pain, joint swelling, arthralgias and gait problem.  Skin: Positive for rash and wound. Negative for color change and pallor.  Neurological:  Positive for headaches. Negative for dizziness, tremors, weakness and light-headedness.  Hematological: Negative for adenopathy. Does not bruise/bleed easily.  Psychiatric/Behavioral: Negative for behavioral problems, confusion, disturbed wake/sleep cycle, dysphoric mood, decreased concentration and agitation.       Objective:   Physical Exam  Constitutional: He is oriented to person, place, and time. No distress.  HENT:  Head: Normocephalic and atraumatic.    Mouth/Throat: Oropharynx is clear and moist. No oropharyngeal exudate.  Eyes: Conjunctivae normal and EOM are normal. Pupils are equal, round, and reactive to light. No scleral icterus.  Neck: Normal range of motion. Neck supple. No JVD present.  Cardiovascular: Normal rate, regular rhythm and normal heart sounds.  Exam reveals no gallop and no friction rub.   No murmur heard. Pulmonary/Chest: Effort normal and breath sounds normal. No respiratory distress. He has no wheezes. He has no rales. He exhibits no tenderness.  Abdominal: He exhibits no distension and no mass. There is no tenderness. There is no rebound and no guarding.  Musculoskeletal: He exhibits no edema and no tenderness.  Lymphadenopathy:    He has no cervical adenopathy.  Neurological: He is alert and oriented to person, place, and time. He has normal reflexes. He exhibits normal muscle tone. Coordination normal.  Skin: Skin is warm and dry. Rash noted. He is not diaphoretic. There is erythema. No pallor.     Psychiatric: He has a normal mood and affect. His behavior is normal. Judgment and thought content normal.          Assessment & Plan:  HIV: continue TIvicay and truvada, bactrim and weekly azithromycin  Ear lesion: apply topical antibiotic  Rash: Perhaps an eosinophillic follicultiis. Give triamcinolone 0.5%  Weight gain: not clear explanation for this

## 2012-10-24 NOTE — Patient Instructions (Addendum)
OK to overbook on the 4th of December with me, preferably at end of morning

## 2012-10-28 ENCOUNTER — Ambulatory Visit: Payer: Self-pay | Admitting: Infectious Disease

## 2012-10-29 ENCOUNTER — Telehealth: Payer: Self-pay | Admitting: Licensed Clinical Social Worker

## 2012-10-29 NOTE — Telephone Encounter (Signed)
Patient walked in the clinic today stating that he is having left sided arm and wrist pain for 2 days, and fever. I checked his temperature it was 97.9 and his bp was 129/77. Patient took an aleve last night around 9 pm. I told the patient I would forward his concerns to Dr. [name] and call him back. He did not have an urgent concern and he was ok with waiting to hear back from Dr. [name].

## 2012-10-30 NOTE — Telephone Encounter (Signed)
He can continue to take the alleve. If he continues to have problem perhaps clinic md could work him in??

## 2012-10-31 NOTE — Telephone Encounter (Signed)
I called the patient and left a message.

## 2012-11-13 ENCOUNTER — Other Ambulatory Visit: Payer: Self-pay

## 2012-11-27 ENCOUNTER — Telehealth: Payer: Self-pay | Admitting: *Deleted

## 2012-11-27 ENCOUNTER — Ambulatory Visit: Payer: Self-pay | Admitting: Infectious Disease

## 2012-11-27 NOTE — Telephone Encounter (Signed)
Left message on generic voicemail box asking patient to please reschedule both lab and follow up appointments at his doctor's office as soon as possible, and to please call us if he had any questions. Andree Coss

## 2012-12-02 ENCOUNTER — Other Ambulatory Visit: Payer: Self-pay

## 2012-12-12 ENCOUNTER — Other Ambulatory Visit (INDEPENDENT_AMBULATORY_CARE_PROVIDER_SITE_OTHER): Payer: Self-pay

## 2012-12-12 DIAGNOSIS — B20 Human immunodeficiency virus [HIV] disease: Secondary | ICD-10-CM

## 2012-12-12 LAB — COMPREHENSIVE METABOLIC PANEL
Albumin: 4 g/dL (ref 3.5–5.2)
BUN: 11 mg/dL (ref 6–23)
CO2: 28 mEq/L (ref 19–32)
Calcium: 9.6 mg/dL (ref 8.4–10.5)
Glucose, Bld: 92 mg/dL (ref 70–99)
Potassium: 3.8 mEq/L (ref 3.5–5.3)
Sodium: 139 mEq/L (ref 135–145)
Total Protein: 8.8 g/dL — ABNORMAL HIGH (ref 6.0–8.3)

## 2012-12-12 LAB — CBC WITH DIFFERENTIAL/PLATELET
HCT: 42.2 % (ref 39.0–52.0)
Hemoglobin: 15.3 g/dL (ref 13.0–17.0)
Lymphs Abs: 2.1 10*3/uL (ref 0.7–4.0)
MCH: 30.2 pg (ref 26.0–34.0)
Monocytes Absolute: 0.2 10*3/uL (ref 0.1–1.0)
Monocytes Relative: 5 % (ref 3–12)
Neutro Abs: 1.2 10*3/uL — ABNORMAL LOW (ref 1.7–7.7)
Neutrophils Relative %: 34 % — ABNORMAL LOW (ref 43–77)
RBC: 5.06 MIL/uL (ref 4.22–5.81)

## 2012-12-13 LAB — HIV-1 RNA QUANT-NO REFLEX-BLD
HIV 1 RNA Quant: 56698 copies/mL — ABNORMAL HIGH (ref <20)
HIV-1 RNA Quant, Log: 4.75 {Log} — ABNORMAL HIGH (ref <1.30)

## 2012-12-20 LAB — HLA B*5701: HLA-B*5701: NEGATIVE

## 2013-01-06 ENCOUNTER — Ambulatory Visit (INDEPENDENT_AMBULATORY_CARE_PROVIDER_SITE_OTHER): Payer: Self-pay | Admitting: Infectious Disease

## 2013-01-06 ENCOUNTER — Other Ambulatory Visit: Payer: Self-pay | Admitting: Infectious Disease

## 2013-01-06 ENCOUNTER — Encounter: Payer: Self-pay | Admitting: Infectious Disease

## 2013-01-06 VITALS — BP 116/80 | HR 79 | Temp 98.1°F | Wt 179.0 lb

## 2013-01-06 DIAGNOSIS — Z9119 Patient's noncompliance with other medical treatment and regimen: Secondary | ICD-10-CM

## 2013-01-06 DIAGNOSIS — Z21 Asymptomatic human immunodeficiency virus [HIV] infection status: Secondary | ICD-10-CM

## 2013-01-06 DIAGNOSIS — M791 Myalgia, unspecified site: Secondary | ICD-10-CM | POA: Insufficient documentation

## 2013-01-06 DIAGNOSIS — B86 Scabies: Secondary | ICD-10-CM

## 2013-01-06 DIAGNOSIS — F32A Depression, unspecified: Secondary | ICD-10-CM | POA: Insufficient documentation

## 2013-01-06 DIAGNOSIS — G4700 Insomnia, unspecified: Secondary | ICD-10-CM

## 2013-01-06 DIAGNOSIS — F329 Major depressive disorder, single episode, unspecified: Secondary | ICD-10-CM

## 2013-01-06 DIAGNOSIS — IMO0001 Reserved for inherently not codable concepts without codable children: Secondary | ICD-10-CM

## 2013-01-06 DIAGNOSIS — R21 Rash and other nonspecific skin eruption: Secondary | ICD-10-CM

## 2013-01-06 DIAGNOSIS — M545 Low back pain: Secondary | ICD-10-CM

## 2013-01-06 DIAGNOSIS — B20 Human immunodeficiency virus [HIV] disease: Secondary | ICD-10-CM

## 2013-01-06 LAB — CK: Total CK: 101 U/L (ref 7–232)

## 2013-01-06 MED ORDER — DARUNAVIR ETHANOLATE 800 MG PO TABS: 800.0000 mg | ORAL_TABLET | Freq: Every day | ORAL | Status: DC

## 2013-01-06 MED ORDER — RITONAVIR 100 MG PO TABS: 100.0000 mg | ORAL_TABLET | Freq: Every day | ORAL | Status: DC

## 2013-01-06 MED ORDER — TRIAMCINOLONE ACETONIDE 0.5 % EX OINT: TOPICAL_OINTMENT | Freq: Two times a day (BID) | CUTANEOUS | Status: DC

## 2013-01-06 MED ORDER — CITALOPRAM HYDROBROMIDE 20 MG PO TABS: 20.0000 mg | ORAL_TABLET | Freq: Every day | ORAL | Status: DC

## 2013-01-06 MED ORDER — AZITHROMYCIN 600 MG PO TABS: 1200.0000 mg | ORAL_TABLET | ORAL | Status: DC

## 2013-01-06 MED ORDER — SULFAMETHOXAZOLE-TMP DS 800-160 MG PO TABS: 1.0000 | ORAL_TABLET | Freq: Every day | ORAL | Status: DC

## 2013-01-06 NOTE — Patient Instructions (Addendum)
YOUR NEW REGIMEN IS:  PREZISTA 800MG  (MAROON PILL) ONCE DAILY  WITH  [name] 100MG  (WHITE TABLET) ONCE DAILY  AND  TRUVADA (BLUE PILL ONCE DAILY)  YOU NEED TO TAKE BACTRIM DS TABLET (WHITE PILL) TO PREVENT PNEUMONIA  YOU ALSO NEED TO TAKE AZITHROMYCIN 600MG  TWO TABLETS ONCE WEEKLY  YOU CAN TAKE IBUPROFEN FOR THE MUSCLE PAIN

## 2013-01-06 NOTE — Progress Notes (Signed)
Subjective:    Patient ID: Justin Ball, male    DOB: 1991-10-13, 22 y.o.   MRN: [id number]  HPI  22 year old with recently diagnosed HIV/AIDS whom we started on tivicay and truvada in early November (when he started taking the meds). He claims that when starting his antiretrovirals he developed lower back pain as well as pain in his left shoulder and paresthesias in his left hand. The pain has been severe enough at times that he is not able to sleep at night. He claims he has been taking his antiretroviral therapy although a viral load check in December showed his viral load is still be in the 50,000s would have been 60,000 prior to initiation of his antiretroviral therapy. There was a lag in time between when his viral load was checked when I prescribed medicines when he began actually taking the medicines though still taken back by the viral load of 50,000 in December, and I am skeptical that he has been here and with his antiretrovirals as he claims.  We're going to recheck viral load with a genotype and CD4 count today. I proposed to change into a new regimen of Prezista [name] and Truvada in case the tivicay was causing a myositis--I doubt this and think it is more likely that he is experiencing IRIS here.  His rash improved on TRIAMcinolone but he wants it resolve completely.  The patient does have SIGNIFICANT problems with Depression and anxiety and has had these ever since he was dx. He has had himself no suicidal ideation but he has had thoughts of harming his partner. He assures me that he would never do set anything to harm his partner anyone else but that such thoughts have bothered him since he has had his diagnosis. He is contracted for safety with myself and I'm going to start him on a antidepressant. He is also anemic Justin Ball our counselor. I spent greater than 45 minutes with the patient including greater than 50% of time in face to face counsel of the patient and in coordination of  their care.   Review of Systems  Constitutional: Negative for fever, chills, diaphoresis, activity change, appetite change, fatigue and unexpected weight change.  HENT: Negative for congestion, sore throat, rhinorrhea, sneezing, trouble swallowing and sinus pressure.   Eyes: Negative for photophobia and visual disturbance.  Respiratory: Negative for cough, chest tightness, shortness of breath, wheezing and stridor.   Cardiovascular: Negative for chest pain, palpitations and leg swelling.  Gastrointestinal: Negative for nausea, vomiting, abdominal pain, diarrhea, constipation, blood in stool, abdominal distention and anal bleeding.  Genitourinary: Negative for dysuria, hematuria, flank pain and difficulty urinating.  Musculoskeletal: Negative for myalgias, back pain, joint swelling, arthralgias and gait problem.  Skin: Positive for rash. Negative for color change, pallor and wound.  Neurological: Negative for dizziness, tremors, weakness and light-headedness.  Hematological: Negative for adenopathy. Does not bruise/bleed easily.  Psychiatric/Behavioral: Positive for sleep disturbance, dysphoric mood and agitation. Negative for suicidal ideas, behavioral problems, confusion, self-injury and decreased concentration.       Objective:   Physical Exam  Constitutional: He is oriented to person, place, and time. He appears well-developed and well-nourished. No distress.  HENT:  Head: Normocephalic and atraumatic.  Mouth/Throat: Oropharynx is clear and moist. No oropharyngeal exudate.  Eyes: Conjunctivae normal and EOM are normal. Pupils are equal, round, and reactive to light. No scleral icterus.  Neck: Normal range of motion. Neck supple. No JVD present.  Cardiovascular: Normal rate, regular rhythm  and normal heart sounds.  Exam reveals no gallop and no friction rub.   No murmur heard. Pulmonary/Chest: Effort normal and breath sounds normal. No respiratory distress. He has no wheezes. He has no  rales. He exhibits no tenderness.  Abdominal: He exhibits no distension and no mass. There is no tenderness. There is no rebound and no guarding.  Musculoskeletal: He exhibits no edema and no tenderness.  Lymphadenopathy:    He has no cervical adenopathy.  Neurological: He is alert and oriented to person, place, and time. He has normal reflexes. He exhibits normal muscle tone. Coordination normal.  Skin: Skin is warm and dry. He is not diaphoretic. No erythema. No pallor.     Psychiatric: Judgment and thought content normal. His mood appears anxious. He exhibits a depressed mood.          Assessment & Plan:  HIV: change to prezista norvir and truvada. Continue OI prophlyaxis. We will have to follow Justin Ball very closely  Myalgias: check CPK. I doubt  This is due to Tivicay but will change her ARV as above. More likely component of IRIS.  Depression: start SSRI, pt will see Justin Ball. Contracted for safety  Insominia: may need a hypnotic. Will see how he does when we change his ARVs and start SSRI  Rash: renewed triamcinolone

## 2013-01-07 ENCOUNTER — Ambulatory Visit: Payer: Self-pay

## 2013-01-07 LAB — CBC WITH DIFFERENTIAL/PLATELET
Basophils Absolute: 0 10*3/uL (ref 0.0–0.1)
Basophils Relative: 2 % — ABNORMAL HIGH (ref 0–1)
MCHC: 34.8 g/dL (ref 30.0–36.0)
Neutro Abs: 0.7 10*3/uL — ABNORMAL LOW (ref 1.7–7.7)
Neutrophils Relative %: 27 % — ABNORMAL LOW (ref 43–77)
RDW: 13.6 % (ref 11.5–15.5)

## 2013-01-08 ENCOUNTER — Ambulatory Visit: Payer: Self-pay | Admitting: Infectious Disease

## 2013-01-14 LAB — HIV-1 GENOTYPR PLUS

## 2013-01-15 LAB — HIV-1 INTEGRASE GENOTYPE

## 2013-01-21 ENCOUNTER — Ambulatory Visit: Payer: Self-pay

## 2013-01-21 DIAGNOSIS — F3164 Bipolar disorder, current episode mixed, severe, with psychotic features: Secondary | ICD-10-CM

## 2013-01-21 NOTE — Progress Notes (Signed)
I met with Justin Ball today for the first time and he came in with his partner, Casimiro Needle.  He reports suicidal ideation with no intent, but sometimes a vague plan of overdosing.  He also thinks of hurting other people when they are "nagging" him, but he does not act on these thoughts as yet.  He said he was diagnosed with "HDAD" (ADHD) when he was 8 or 9 and took Ritalin until he got to high school.  He said he finished high school and is considering attending [employer] to study computer science.   He reports mood swings, depressive moods, irritability, poor concentration, poor sleep (3 hours at night, then a nap of 3-5 hours during the day), nightmares, low energy, auditory hallucinations (whispering, car horn, door shutting), poor appetite. He also says he visualizes what is going to happen sometimes before it happens.  I went over the criteria for mania and he met 6 of a possible 7, including: grandiosity at times; decreased need for sleep at times; racing thoughts; easily distracted; psychomotor agitation (he frequently cannot sit still, and was bouncing his leg during our session); and has gone on "shopping sprees" - spending all his money, then regretting it.  Combining this confirmation of manic behavior with his reports periods of depression, he meets criteria for Bipolar I Disorder, with psychotic features.  I recommended he get a psychiatric evaluation at [facility] and had him sign necessary releases, so that we can communicate back and forth.  He also reports "blanking out" (dissociation) and nightmares, both symptoms of PTSD, but does not endorse any history of trauma.  He was very pleasant and cooperative and seemed relieved in some ways to have an explanation of what is wrong with him psychiatrically.  I explained that it does NOT mean he is "crazy" and that it is not his fault that he has this diagnosis.  I plan to meet with him in 2 weeks.

## 2013-02-04 ENCOUNTER — Ambulatory Visit: Payer: Self-pay

## 2013-02-05 ENCOUNTER — Other Ambulatory Visit: Payer: Self-pay

## 2013-02-18 ENCOUNTER — Ambulatory Visit: Payer: Self-pay

## 2013-02-19 ENCOUNTER — Encounter (HOSPITAL_COMMUNITY): Payer: Self-pay | Admitting: Emergency Medicine

## 2013-02-19 ENCOUNTER — Emergency Department (HOSPITAL_COMMUNITY)
Admission: EM | Admit: 2013-02-19 | Discharge: 2013-02-19 | Disposition: A | Discharge status: Home / Self Care | Payer: No Typology Code available for payment source | Attending: Emergency Medicine | Admitting: Emergency Medicine

## 2013-02-19 ENCOUNTER — Encounter: Payer: Self-pay | Admitting: Infectious Disease

## 2013-02-19 ENCOUNTER — Ambulatory Visit (INDEPENDENT_AMBULATORY_CARE_PROVIDER_SITE_OTHER): Payer: No Typology Code available for payment source | Admitting: Infectious Disease

## 2013-02-19 VITALS — BP 111/76 | HR 80 | Temp 98.4°F | Ht 73.0 in | Wt 178.0 lb

## 2013-02-19 DIAGNOSIS — Z79899 Other long term (current) drug therapy: Secondary | ICD-10-CM | POA: Insufficient documentation

## 2013-02-19 DIAGNOSIS — Z872 Personal history of diseases of the skin and subcutaneous tissue: Secondary | ICD-10-CM | POA: Insufficient documentation

## 2013-02-19 DIAGNOSIS — Z21 Asymptomatic human immunodeficiency virus [HIV] infection status: Secondary | ICD-10-CM | POA: Insufficient documentation

## 2013-02-19 DIAGNOSIS — R45851 Suicidal ideations: Secondary | ICD-10-CM

## 2013-02-19 DIAGNOSIS — F3160 Bipolar disorder, current episode mixed, unspecified: Secondary | ICD-10-CM

## 2013-02-19 DIAGNOSIS — Z8619 Personal history of other infectious and parasitic diseases: Secondary | ICD-10-CM | POA: Insufficient documentation

## 2013-02-19 DIAGNOSIS — F319 Bipolar disorder, unspecified: Secondary | ICD-10-CM | POA: Insufficient documentation

## 2013-02-19 DIAGNOSIS — B20 Human immunodeficiency virus [HIV] disease: Secondary | ICD-10-CM

## 2013-02-19 DIAGNOSIS — F17200 Nicotine dependence, unspecified, uncomplicated: Secondary | ICD-10-CM | POA: Insufficient documentation

## 2013-02-19 HISTORY — DX: Bipolar disorder, unspecified: F31.9

## 2013-02-19 LAB — COMPREHENSIVE METABOLIC PANEL
ALT: 39 U/L (ref 0–53)
AST: 42 U/L — ABNORMAL HIGH (ref 0–37)
Albumin: 3.8 g/dL (ref 3.5–5.2)
Alkaline Phosphatase: 97 U/L (ref 39–117)
BUN: 10 mg/dL (ref 6–23)
Chloride: 103 mEq/L (ref 96–112)
Potassium: 3.8 mEq/L (ref 3.5–5.1)
Sodium: 136 mEq/L (ref 135–145)
Total Bilirubin: 0.5 mg/dL (ref 0.3–1.2)
Total Protein: 8.9 g/dL — ABNORMAL HIGH (ref 6.0–8.3)

## 2013-02-19 LAB — COMPLETE METABOLIC PANEL WITH GFR
ALT: 36 U/L (ref 0–53)
Alkaline Phosphatase: 80 U/L (ref 39–117)
CO2: 28 mEq/L (ref 19–32)
Creat: 1.09 mg/dL (ref 0.50–1.35)
GFR, Est African American: >89 mL/min
GFR, Est Non African American: >89 mL/min
Glucose, Bld: 81 mg/dL (ref 70–99)
Sodium: 136 mEq/L (ref 135–145)
Total Bilirubin: 0.7 mg/dL (ref 0.3–1.2)
Total Protein: 8.3 g/dL (ref 6.0–8.3)

## 2013-02-19 LAB — CBC WITH DIFFERENTIAL/PLATELET
Basophils Relative: 1 % (ref 0–1)
Eosinophils Absolute: 0.1 10*3/uL (ref 0.0–0.7)
Eosinophils Relative: 3 % (ref 0–5)
HCT: 44.2 % (ref 39.0–52.0)
Hemoglobin: 15.3 g/dL (ref 13.0–17.0)
Lymphs Abs: 1.5 10*3/uL (ref 0.7–4.0)
MCH: 30.3 pg (ref 26.0–34.0)
MCHC: 34.6 g/dL (ref 30.0–36.0)
MCHC: 35 g/dL (ref 30.0–36.0)
MCV: 86.5 fL (ref 78.0–100.0)
Monocytes Absolute: 0.3 10*3/uL (ref 0.1–1.0)
Monocytes Relative: 10 % (ref 3–12)
Neutro Abs: 1.1 10*3/uL — ABNORMAL LOW (ref 1.7–7.7)
Neutrophils Relative %: 36 % — ABNORMAL LOW (ref 43–77)
Neutrophils Relative %: 36 % — ABNORMAL LOW (ref 43–77)
Platelets: 267 10*3/uL (ref 150–400)
RBC: 4.9 MIL/uL (ref 4.22–5.81)

## 2013-02-19 LAB — RAPID URINE DRUG SCREEN, HOSP PERFORMED
Amphetamines: NOT DETECTED
Barbiturates: NOT DETECTED
Opiates: NOT DETECTED
Tetrahydrocannabinol: POSITIVE — AB

## 2013-02-19 LAB — URINALYSIS, ROUTINE W REFLEX MICROSCOPIC
Glucose, UA: NEGATIVE mg/dL
Ketones, ur: NEGATIVE mg/dL
Leukocytes, UA: NEGATIVE
Nitrite: NEGATIVE
Protein, ur: NEGATIVE mg/dL
Urobilinogen, UA: 1 mg/dL (ref 0.0–1.0)

## 2013-02-19 LAB — ETHANOL: Alcohol, Ethyl (B): <11 mg/dL (ref 0–11)

## 2013-02-19 MED ORDER — IBUPROFEN 600 MG PO TABS: 600.0000 mg | ORAL_TABLET | Freq: Three times a day (TID) | ORAL | Status: DC | PRN

## 2013-02-19 MED ORDER — SULFAMETHOXAZOLE-TMP DS 800-160 MG PO TABS
1.0000 | ORAL_TABLET | Freq: Every day | ORAL | Status: DC
  Filled 2013-02-19: qty 1

## 2013-02-19 MED ORDER — DARUNAVIR ETHANOLATE 800 MG PO TABS
800.0000 mg | ORAL_TABLET | Freq: Every day | ORAL | Status: DC
  Filled 2013-02-19: qty 1

## 2013-02-19 MED ORDER — TRIAMCINOLONE ACETONIDE 0.5 % EX OINT
TOPICAL_OINTMENT | Freq: Two times a day (BID) | CUTANEOUS | Status: DC
  Administered 2013-02-19: 13:00:00 via TOPICAL
  Filled 2013-02-19: qty 15

## 2013-02-19 MED ORDER — SULFAMETHOXAZOLE-TMP DS 800-160 MG PO TABS
1.0000 | ORAL_TABLET | Freq: Every day | ORAL | Status: DC
  Administered 2013-02-19: 1 via ORAL

## 2013-02-19 MED ORDER — EMTRICITABINE-TENOFOVIR DF 200-300 MG PO TABS
1.0000 | ORAL_TABLET | Freq: Every day | ORAL | Status: DC
  Filled 2013-02-19: qty 1

## 2013-02-19 MED ORDER — NICOTINE 21 MG/24HR TD PT24
21.0000 mg | MEDICATED_PATCH | Freq: Once | TRANSDERMAL | Status: DC
  Administered 2013-02-19: 21 mg via TRANSDERMAL
  Filled 2013-02-19: qty 1

## 2013-02-19 MED ORDER — CITALOPRAM HYDROBROMIDE 20 MG PO TABS: 20.0000 mg | ORAL_TABLET | Freq: Every day | ORAL | Status: DC

## 2013-02-19 MED ORDER — RITONAVIR 100 MG PO TABS
100.0000 mg | ORAL_TABLET | Freq: Every day | ORAL | Status: DC
  Filled 2013-02-19: qty 1

## 2013-02-19 MED ORDER — ONDANSETRON HCL 4 MG PO TABS: 4.0000 mg | ORAL_TABLET | Freq: Three times a day (TID) | ORAL | Status: DC | PRN

## 2013-02-19 MED ORDER — LORAZEPAM 1 MG PO TABS: 1.0000 mg | ORAL_TABLET | Freq: Three times a day (TID) | ORAL | Status: DC | PRN

## 2013-02-19 MED ORDER — ACETAMINOPHEN 325 MG PO TABS: 650.0000 mg | ORAL_TABLET | ORAL | Status: DC | PRN

## 2013-02-19 NOTE — ED Notes (Signed)
Pt is here from his infecious disease clinic. He wants pt to be evaluated for his bipolar. Pt states that he wants inpatient treatment and that he had previous SI that is what he told his md. Pt calm and voluntary.

## 2013-02-19 NOTE — ED Notes (Signed)
Patient is in blue scrubs and red socks has one personal belonging bag and friend took it with him.

## 2013-02-19 NOTE — ED Provider Notes (Signed)
Medical screening examination/treatment/procedure(s) were conducted as a shared visit with non-physician practitioner(s) and myself.  I personally evaluated the patient during the encounter  Hx HIV with vague SI without plan. Denies headache, fever, chest pain, SOB, nausea, vomiting.  Glynn Octave, MD 02/19/13 1447

## 2013-02-19 NOTE — ED Provider Notes (Signed)
History     CSN: [id number]  Arrival date & time 02/19/13  1143   First MD Initiated Contact with Patient 02/19/13 1220      Chief Complaint  Patient presents with  . Medical Clearance    (Consider location/radiation/quality/duration/timing/severity/associated sxs/prior treatment) HPI Comments: This is a 22 year old male, past medical history of HIV, and bipolar, who presents emergency department with chief complaint of medical clearance. Patient states that he was sent here from his infectious disease clinic, where he told his provider that his medications were making him have suicidal ideation. Patient endorses the SI, and states that it is been ongoing for the past 2 or 3 weeks. He does not have a plan, but says that he is nervous about his thoughts. He says that it is worse in the morning, and that he wakes up angry. He denies any HI. Nothing makes his symptoms better or worse. He denies any pain. Patient denies headache, blurred vision, new hearing loss, sore throat, chest pain, shortness of breath, nausea, vomiting, diarrhea, constipation, dysuria, peripheral edema, back pain, numbness or tingling of the extremities.   The history is provided by the patient. No language interpreter was used.    Past Medical History  Diagnosis Date  . HIV infection   . Syphilis   . Scabies   . Bipolar 1 disorder     Past Surgical History  Procedure Laterality Date  . Rectal surgery      drainage of abscess    No family history on file.  History  Substance Use Topics  . Smoking status: Current Every Day Smoker -- 0.30 packs/day    Types: Cigarettes  . Smokeless tobacco: Not on file     Comment: 1 cig. daily  . Alcohol Use: 16.0 oz/week    32 drink(s) per week     Comment: weekly       Review of Systems  All other systems reviewed and are negative.    Allergies  Review of patient's allergies indicates no known allergies.  Home Medications   Current Outpatient Rx  Name   Route  Sig  Dispense  Refill  . citalopram (CELEXA) 20 MG tablet   Oral   Take 20 mg by mouth daily.         . Darunavir Ethanolate (PREZISTA) 800 MG tablet   Oral   Take 1 tablet (800 mg total) by mouth daily.   30 tablet   11   . emtricitabine-tenofovir (TRUVADA) 200-300 MG per tablet   Oral   Take 1 tablet by mouth daily.   30 tablet   11   . ritonavir ([name]) 100 MG TABS   Oral   Take 1 tablet (100 mg total) by mouth daily.   30 tablet   11   . sulfamethoxazole-trimethoprim (BACTRIM DS) 800-160 MG per tablet   Oral   Take 1 tablet by mouth daily.   30 tablet   11   . triamcinolone ointment (KENALOG) 0.5 %   Topical   Apply topically 2 (two) times daily.   30 g   2     BP 122/80  Pulse 77  Temp(Src) 98 F (36.7 C) (Oral)  Resp 16  SpO2 100%  Physical Exam  Nursing note and vitals reviewed. Constitutional: He is oriented to person, place, and time. He appears well-developed and well-nourished.  HENT:  Head: Normocephalic and atraumatic.  Mouth/Throat: Oropharynx is clear and moist. No oropharyngeal exudate.  Eyes: Conjunctivae and EOM are normal.  Pupils are equal, round, and reactive to light.  Neck: Normal range of motion. Neck supple.  Cardiovascular: Normal rate, regular rhythm and normal heart sounds.   Pulmonary/Chest: Effort normal and breath sounds normal. No respiratory distress. He has no wheezes. He has no rales. He exhibits no tenderness.  Abdominal: Soft. Bowel sounds are normal.  Musculoskeletal: Normal range of motion.  Neurological: He is alert and oriented to person, place, and time.  Skin: Skin is warm and dry.  Psychiatric: He has a normal mood and affect. His behavior is normal. Judgment and thought content normal.    ED Course  Procedures (including critical care time)  Labs Reviewed  URINALYSIS, ROUTINE W REFLEX MICROSCOPIC  CBC WITH DIFFERENTIAL  COMPREHENSIVE METABOLIC PANEL  URINE RAPID DRUG SCREEN (HOSP PERFORMED)   ETHANOL   No results found.   No diagnosis found.    MDM  22 year old male with SI. I have moved the patient to psych ED, and will place temporary hold orders, and will consult the ACT team.           Roxy Horseman, PA-C 02/19/13 1243

## 2013-02-19 NOTE — ED Notes (Signed)
D/C home.D/C instructions given and stated understanding. Belongings returned. Escorted to front of hospital by security. Ambulatory, denies pain.

## 2013-02-19 NOTE — ED Notes (Signed)
[name] belongings are going home with his friend.
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
## 2013-02-20 LAB — T-HELPER CELL (CD4) - (RCID CLINIC ONLY): CD4 T Cell Abs: 120 uL — ABNORMAL LOW (ref 400–2700)

## 2013-02-21 ENCOUNTER — Telehealth: Payer: Self-pay | Admitting: Infectious Disease

## 2013-02-21 LAB — HIV-1 RNA QUANT-NO REFLEX-BLD: HIV 1 RNA Quant: 62361 copies/mL — ABNORMAL HIGH (ref <20)

## 2013-02-21 NOTE — Telephone Encounter (Signed)
Dat CLEARLY NOT taking meds reliably again  He needs to be seen in next 2 weeks if possible

## 2013-02-24 NOTE — Telephone Encounter (Signed)
I called and left a message with the patient's Grandmother for him to call us.

## 2013-02-27 ENCOUNTER — Ambulatory Visit: Payer: Self-pay

## 2013-02-27 ENCOUNTER — Ambulatory Visit: Payer: No Typology Code available for payment source

## 2013-02-27 DIAGNOSIS — F3164 Bipolar disorder, current episode mixed, severe, with psychotic features: Secondary | ICD-10-CM

## 2013-02-27 NOTE — Progress Notes (Signed)
Travares came in with a friend today and reported that he went to the ED at [facility] due to suicidal thoughts.  He said they prescribed Celexa and it helped (one pill), but that they did not give him a prescription for more.  I later discovered that he had had a bad reaction to it previously and Dr. [name] had suggested he not take it.  I had recommended he go to [facility] for a psychiatric evaluation and med management, but he said he didn't have transportation.  However, [place] did recently go to [place] to [place] a friend.  I provided psycho-education on breathing techniques to reduce anxiety and panic and encouraged him to practice this every day.  He reports poor appetite and depressed mood.  He also reports nightmares.  He also reports auditory hallucinations -- car alarm, door shutting, scratching noises.  I encouraged him again to go to [facility] for evaluation.  Plan to meet again in 2 weeks.

## 2013-03-10 ENCOUNTER — Ambulatory Visit: Payer: No Typology Code available for payment source | Admitting: Infectious Disease

## 2013-03-13 ENCOUNTER — Ambulatory Visit: Payer: No Typology Code available for payment source

## 2013-04-02 ENCOUNTER — Telehealth: Payer: Self-pay | Admitting: *Deleted

## 2013-04-02 NOTE — Telephone Encounter (Signed)
Patient called to advised that since starting his new meds he has developed a rash, worsening insomnia, and he has no appetite. He advised he wants to see the doctor because he wants to take his medication but can not deal with the side effects. Advised the patient that Dr [name] is unavailable but can work him in to see another doctor if he would like. He was fine with an appt with Dr Ninetta Lights for 04/08/13.

## 2013-04-03 ENCOUNTER — Ambulatory Visit: Payer: No Typology Code available for payment source

## 2013-04-08 ENCOUNTER — Ambulatory Visit (INDEPENDENT_AMBULATORY_CARE_PROVIDER_SITE_OTHER): Payer: No Typology Code available for payment source | Admitting: Infectious Diseases

## 2013-04-08 ENCOUNTER — Other Ambulatory Visit: Payer: Self-pay | Admitting: Infectious Diseases

## 2013-04-08 ENCOUNTER — Encounter: Payer: Self-pay | Admitting: Infectious Diseases

## 2013-04-08 VITALS — BP 112/80 | HR 81 | Temp 98.7°F | Ht 73.0 in | Wt 175.0 lb

## 2013-04-08 DIAGNOSIS — Z79899 Other long term (current) drug therapy: Secondary | ICD-10-CM

## 2013-04-08 DIAGNOSIS — R21 Rash and other nonspecific skin eruption: Secondary | ICD-10-CM

## 2013-04-08 DIAGNOSIS — F319 Bipolar disorder, unspecified: Secondary | ICD-10-CM | POA: Insufficient documentation

## 2013-04-08 DIAGNOSIS — B20 Human immunodeficiency virus [HIV] disease: Secondary | ICD-10-CM

## 2013-04-08 DIAGNOSIS — Z113 Encounter for screening for infections with a predominantly sexual mode of transmission: Secondary | ICD-10-CM

## 2013-04-08 DIAGNOSIS — Z21 Asymptomatic human immunodeficiency virus [HIV] infection status: Secondary | ICD-10-CM

## 2013-04-08 LAB — COMPREHENSIVE METABOLIC PANEL
ALT: 22 U/L (ref 0–53)
AST: 29 U/L (ref 0–37)
Alkaline Phosphatase: 91 U/L (ref 39–117)
BUN: 11 mg/dL (ref 6–23)
Creat: 0.9 mg/dL (ref 0.50–1.35)
Potassium: 3.9 mEq/L (ref 3.5–5.3)

## 2013-04-08 LAB — CBC
MCH: 30.2 pg (ref 26.0–34.0)
MCHC: 35.1 g/dL (ref 30.0–36.0)
MCV: 86.1 fL (ref 78.0–100.0)
Platelets: 221 10*3/uL (ref 150–400)
RBC: 5.17 MIL/uL (ref 4.22–5.81)

## 2013-04-08 LAB — LIPID PANEL
HDL: 30 mg/dL — ABNORMAL LOW (ref >39)
LDL Cholesterol: 112 mg/dL — ABNORMAL HIGH (ref 0–99)
Triglycerides: 92 mg/dL (ref <150)
VLDL: 18 mg/dL (ref 0–40)

## 2013-04-08 LAB — RPR

## 2013-04-08 MED ORDER — DRONABINOL 5 MG PO CAPS: 5.0000 mg | ORAL_CAPSULE | Freq: Two times a day (BID) | ORAL | Status: DC

## 2013-04-08 MED ORDER — TEMAZEPAM 15 MG PO CAPS: 15.0000 mg | ORAL_CAPSULE | Freq: Every evening | ORAL | Status: DC | PRN

## 2013-04-08 NOTE — Addendum Note (Signed)
Addended by: Sidonie Dexheimer C on: 04/08/2013 03:15 PM   Modules accepted: Orders

## 2013-04-08 NOTE — Assessment & Plan Note (Signed)
He denies SI. Will get him in with med assistance to get his Rx's. Will give him restoril.

## 2013-04-08 NOTE — Assessment & Plan Note (Signed)
He appears to be doing well. Will repeat his labs, rpr, lipids. Will see him back in 6 weeks.

## 2013-04-08 NOTE — Assessment & Plan Note (Signed)
Has been treated for scabies. 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 Would refer him to derm if we have resources/insurance. Continue kenalog

## 2013-04-08 NOTE — Progress Notes (Signed)
  Subjective:    Patient ID: Justin Ball, male    DOB: 1991/01/02, 22 y.o.   MRN: [id number]  HPI 22 yo M with HIV/AIDS and bipolar d/o. Was seen at mental health yesterday and has not been able to afford his new rx (Abilify and celexa). States SI have resolved since on celexa.  Was previously started on tivicay/trv then changed to DRVr/TRV due to myalgias.  Today having difficulty with appetite and sleep (very little).  Rash is unchanged, kenalog has not helped much.     HIV 1 RNA Quant (copies/mL)  Date Value  02/19/2013 62361*  01/06/2013 1289*  12/12/2012 40981*     CD4 T Cell Abs (cmm)  Date Value  02/19/2013 120*  12/12/2012 150*  10/11/2012 50*    Review of Systems  Constitutional: Positive for appetite change. Negative for fever, chills and unexpected weight change.  Gastrointestinal: Negative for diarrhea and constipation.  Genitourinary: Negative for dysuria.  Neurological: Positive for headaches.  Psychiatric/Behavioral: Positive for sleep disturbance. Negative for suicidal ideas.       Objective:   Physical Exam  Constitutional: He appears well-developed and well-nourished.  HENT:  Mouth/Throat: No oropharyngeal exudate.  Eyes: EOM are normal. Pupils are equal, round, and reactive to light.  Neck: Neck supple.  Cardiovascular: Normal rate, regular rhythm and normal heart sounds.   Pulmonary/Chest: Effort normal and breath sounds normal.  Abdominal: Soft. Bowel sounds are normal. There is no tenderness. There is no rebound.  Musculoskeletal: He exhibits no edema.  Lymphadenopathy:    He has no cervical adenopathy.  Skin: Rash noted. Rash is papular.          Assessment & Plan:

## 2013-04-09 ENCOUNTER — Other Ambulatory Visit: Payer: Self-pay | Admitting: *Deleted

## 2013-04-09 DIAGNOSIS — B20 Human immunodeficiency virus [HIV] disease: Secondary | ICD-10-CM

## 2013-04-09 LAB — HIV-1 RNA ULTRAQUANT REFLEX TO GENTYP+: HIV 1 RNA Quant: 87638 copies/mL — ABNORMAL HIGH (ref <20)

## 2013-04-09 MED ORDER — DRONABINOL 5 MG PO CAPS: 5.0000 mg | ORAL_CAPSULE | Freq: Two times a day (BID) | ORAL | Status: DC

## 2013-04-11 LAB — HIV-1 GENOTYPR PLUS

## 2013-04-30 ENCOUNTER — Ambulatory Visit: Payer: No Typology Code available for payment source | Admitting: Infectious Disease

## 2013-05-07 ENCOUNTER — Ambulatory Visit (INDEPENDENT_AMBULATORY_CARE_PROVIDER_SITE_OTHER): Payer: No Typology Code available for payment source | Admitting: Infectious Disease

## 2013-05-07 ENCOUNTER — Encounter: Payer: Self-pay | Admitting: Infectious Disease

## 2013-05-07 VITALS — BP 111/71 | HR 79 | Temp 98.2°F | Ht 73.0 in | Wt 174.0 lb

## 2013-05-07 DIAGNOSIS — B20 Human immunodeficiency virus [HIV] disease: Secondary | ICD-10-CM

## 2013-05-07 DIAGNOSIS — Z9119 Patient's noncompliance with other medical treatment and regimen: Secondary | ICD-10-CM

## 2013-05-07 DIAGNOSIS — F319 Bipolar disorder, unspecified: Secondary | ICD-10-CM

## 2013-05-07 DIAGNOSIS — Z21 Asymptomatic human immunodeficiency virus [HIV] infection status: Secondary | ICD-10-CM

## 2013-05-07 MED ORDER — DRONABINOL 5 MG PO CAPS: 5.0000 mg | ORAL_CAPSULE | Freq: Two times a day (BID) | ORAL | Status: DC

## 2013-05-07 MED ORDER — SULFAMETHOXAZOLE-TMP DS 800-160 MG PO TABS: 1.0000 | ORAL_TABLET | ORAL | Status: DC

## 2013-05-07 MED ORDER — ARIPIPRAZOLE 15 MG PO TABS: 15.0000 mg | ORAL_TABLET | Freq: Every day | ORAL | Status: DC
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
## 2013-05-07 NOTE — Patient Instructions (Addendum)
TO BE CLEAR YOU NEED TO TAKE THE FOLLOWING MEDICINES EVERY SINGLE DAY:  PREZISTA 800MG  (MAROON COLORED PILL) ONCE DAILY EVERY SINGLE DAY  [name] 100MG  (WHITE PILL) ONCE DAILY EVERY SINGLE DAY  TRUVADA (BLUE PILL) ONCE DAILY EVERY SINGLE DAY  THESE WILL HELP CONTROL YOUR HIV VIRUS SO THAT YOUR IMMUNE SYSTEM CAN GET STRONGER  YOU ALSO NEED TO TAKE  BACTRIM DS (WHITE CHALKY TABLET) AT LEAST THREE TIMES A WEEK TO PREVENT PCP PNEUMONIA   PLEASE BRING ALL OF YOUR MEDICINES TO CLINIC ON THE 28TH WHEN YOU FOLLOWUP WITH ME

## 2013-05-08 NOTE — Addendum Note (Signed)
Addended by: Mariea Clonts D on: 05/08/2013 09:10 AM   Modules accepted: Orders

## 2013-05-20 ENCOUNTER — Ambulatory Visit: Payer: No Typology Code available for payment source

## 2013-05-20 DIAGNOSIS — F319 Bipolar disorder, unspecified: Secondary | ICD-10-CM

## 2013-05-20 NOTE — Progress Notes (Signed)
Justin Ball reported that he is now going to [facility] for medication management and is taking Abilify and Celexa.  He reports he is no longer having auditory hallucinations and is sleeping much better.  He reported he slept 12 hours last night.  He reported his mood at 6-7 on a 10 pt scale.  He said he feels "more peace of mind".  He and his partner are having arguments lately and he agreed to talk to him about coming in together in the future.  He also talked about his tendency to let other people "run over" him.  I provided him with a worksheet from [employer] called "Cheerleading Statements" -- about being assertive and learning how to say no.  This was helpful to him and he said he was going to look at it from time to time.  I also talked with him about anger management and how to make "I" statements.  Plan to meet again in 2 weeks.

## 2013-05-21 ENCOUNTER — Ambulatory Visit (INDEPENDENT_AMBULATORY_CARE_PROVIDER_SITE_OTHER): Payer: No Typology Code available for payment source | Admitting: Infectious Disease

## 2013-05-21 ENCOUNTER — Encounter: Payer: Self-pay | Admitting: Infectious Disease

## 2013-05-21 VITALS — BP 117/78 | HR 80 | Temp 98.2°F | Ht 73.0 in | Wt 176.0 lb

## 2013-05-21 DIAGNOSIS — K625 Hemorrhage of anus and rectum: Secondary | ICD-10-CM

## 2013-05-21 DIAGNOSIS — F319 Bipolar disorder, unspecified: Secondary | ICD-10-CM

## 2013-05-21 DIAGNOSIS — K611 Rectal abscess: Secondary | ICD-10-CM

## 2013-05-21 DIAGNOSIS — Z9119 Patient's noncompliance with other medical treatment and regimen: Secondary | ICD-10-CM

## 2013-05-21 DIAGNOSIS — Z21 Asymptomatic human immunodeficiency virus [HIV] infection status: Secondary | ICD-10-CM

## 2013-05-21 DIAGNOSIS — B20 Human immunodeficiency virus [HIV] disease: Secondary | ICD-10-CM

## 2013-05-21 DIAGNOSIS — K612 Anorectal abscess: Secondary | ICD-10-CM

## 2013-05-21 LAB — COMPLETE METABOLIC PANEL WITH GFR
ALT: 17 U/L (ref 0–53)
Albumin: 4.1 g/dL (ref 3.5–5.2)
Alkaline Phosphatase: 73 U/L (ref 39–117)
CO2: 29 mEq/L (ref 19–32)
GFR, Est African American: >89 mL/min
GFR, Est Non African American: >89 mL/min
Glucose, Bld: 76 mg/dL (ref 70–99)
Potassium: 4.5 mEq/L (ref 3.5–5.3)
Sodium: 142 mEq/L (ref 135–145)
Total Protein: 8.4 g/dL — ABNORMAL HIGH (ref 6.0–8.3)

## 2013-05-21 LAB — CBC WITH DIFFERENTIAL/PLATELET
Hemoglobin: 14.5 g/dL (ref 13.0–17.0)
Lymphs Abs: 1.1 10*3/uL (ref 0.7–4.0)
Monocytes Relative: 8 % (ref 3–12)
Neutro Abs: 1.3 10*3/uL — ABNORMAL LOW (ref 1.7–7.7)
Neutrophils Relative %: 45 % (ref 43–77)
Platelets: 252 10*3/uL (ref 150–400)
RBC: 4.8 MIL/uL (ref 4.22–5.81)
WBC: 2.8 10*3/uL — ABNORMAL LOW (ref 4.0–10.5)

## 2013-05-21 NOTE — Progress Notes (Signed)
Subjective:    Patient ID: Justin Ball, male    DOB: 26-Aug-1991, 22 y.o.   MRN: [id number]  HPI   22 year old with recently diagnosed HIV/AIDS whom we started on tivicay and truvada in early November (when he started taking the meds). He claims that when starting his antiretrovirals he developed lower back pain as well as pain in his left shoulder and paresthesias in his left hand. We had found nice drop Viral load and doubling of his CD4 count from 50 to 150 by January. We then changed him over to Prezista [name] and Truvada. He has been apparently VERy poorly compliant based on lack of VL response but no documented R.   He is also suffering from bipolar disorder and has been hospitalized with rx with celexa and cymbalta He has been seeing Franne Forts  is also being engaged by a [place] our. bridge counselor  He is accompanied today by his partner who is also HIV positive allegedly with good virological control. Apparently they're not using condoms to protect mother from further transmission of HIV or other sexual transmitted diseases. I have exhorted him and his partner to use condoms especially while they both are not perfectly controlled. Fortunately for Mr. Viana is walk virus continues to appear as well type this is not developed mutations on his current protease inhibitor-based regimen.  He continues to complain about pill burden but explained to him that this will remain the case until he can take his antiretrovirals reliably. He is already on a simplified PCP prophylaxis regimen with Bactrim 3 times weekly. He claims to me that since he saw me 2 weeks ago that he has been taking his Prezista [name] every day except may be missing one day per week of therapy. He also complains of problems with intermittent rectal bleeding. He has been seen by a general surgeon who performed incision and debridement of her perirectal abscess. His hemoglobins have been stable I see no urgent need for colonoscopy or  further aggressive workup at this time.    Review of Systems  Constitutional: Negative for fever, chills, diaphoresis, activity change, appetite change, fatigue and unexpected weight change.  HENT: Negative for congestion, sore throat, rhinorrhea, sneezing, trouble swallowing and sinus pressure.   Eyes: Negative for photophobia and visual disturbance.  Respiratory: Negative for cough, chest tightness, shortness of breath, wheezing and stridor.   Cardiovascular: Negative for chest pain, palpitations and leg swelling.  Gastrointestinal: Positive for blood in stool. Negative for nausea, vomiting, abdominal pain, diarrhea, constipation, abdominal distention and anal bleeding.  Genitourinary: Negative for dysuria, hematuria, flank pain and difficulty urinating.  Musculoskeletal: Negative for myalgias, back pain, joint swelling, arthralgias and gait problem.  Skin: Negative for color change, pallor, rash and wound.  Neurological: Negative for dizziness, tremors, weakness and light-headedness.  Hematological: Negative for adenopathy. Does not bruise/bleed easily.  Psychiatric/Behavioral: Positive for behavioral problems, sleep disturbance, dysphoric mood and decreased concentration. Negative for confusion, self-injury and agitation. The patient is not hyperactive.        Objective:   Physical Exam  Constitutional: He is oriented to person, place, and time. He appears well-developed and well-nourished. No distress.  HENT:  Head: Normocephalic and atraumatic.  Mouth/Throat: Oropharynx is clear and moist. No oropharyngeal exudate.  Eyes: Conjunctivae and EOM are normal. Pupils are equal, round, and reactive to light. No scleral icterus.  Neck: Normal range of motion. Neck supple. No JVD present.  Cardiovascular: Normal rate, regular rhythm and normal heart  sounds.  Exam reveals no gallop and no friction rub.   No murmur heard. Pulmonary/Chest: Effort normal and breath sounds normal. No  respiratory distress. He has no wheezes. He has no rales. He exhibits no tenderness.  Abdominal: He exhibits no distension and no mass. There is no tenderness. There is no rebound and no guarding.  Musculoskeletal: He exhibits no edema and no tenderness.  Lymphadenopathy:    He has no cervical adenopathy.  Neurological: He is alert and oriented to person, place, and time. He has normal reflexes. He exhibits normal muscle tone. Coordination normal.  Skin: Skin is warm and dry. He is not diaphoretic. No erythema. No pallor.  Psychiatric: Judgment normal. His mood appears anxious. He is not agitated. He exhibits a depressed mood. He expresses no homicidal and no suicidal ideation. He expresses no suicidal plans and no homicidal plans.          Assessment & Plan:   HIV: recheck VL and CD4 today, continue current regimen. May need to add back azithro. Can take TMP/SMX MWF  Bipolar disorder : contnue celexa and  cymbalta which we   Noncompliance: Mitch from [facility] engaged. Have him bring meds and will consider pill box RTC IN 2 weeks  Rectal bleeding: Likely due to hemorrhoids. Cannot see any urgent need for colonoscopy at this point in time certainly is at risk for multiple opportunistic infections but absent such urgent evidence for intervention I would not be referring him to a GI physician for colonoscopy at present.

## 2013-05-23 ENCOUNTER — Telehealth: Payer: Self-pay | Admitting: Infectious Disease

## 2013-05-23 LAB — HIV-1 RNA QUANT-NO REFLEX-BLD
HIV 1 RNA Quant: 19039 copies/mL — ABNORMAL HIGH (ref <20)
HIV-1 RNA Quant, Log: 4.28 {Log} — ABNORMAL HIGH (ref <1.30)

## 2013-05-23 MED ORDER — AZITHROMYCIN 600 MG PO TABS: 1200.0000 mg | ORAL_TABLET | ORAL | Status: DC

## 2013-05-23 NOTE — Telephone Encounter (Signed)
Thanks

## 2013-05-23 NOTE — Telephone Encounter (Signed)
Justin Ball's CD4 remains below 100 and he needs to start azithromycin 1200mg  weekly

## 2013-05-23 NOTE — Telephone Encounter (Signed)
Patient notified Justin Ball  

## 2013-05-30 ENCOUNTER — Encounter (HOSPITAL_COMMUNITY): Payer: Self-pay | Admitting: *Deleted

## 2013-05-30 ENCOUNTER — Emergency Department (INDEPENDENT_AMBULATORY_CARE_PROVIDER_SITE_OTHER)
Admission: EM | Admit: 2013-05-30 | Discharge: 2013-05-30 | Disposition: A | Discharge status: Home / Self Care | Payer: No Typology Code available for payment source | Source: Home / Self Care

## 2013-05-30 DIAGNOSIS — K625 Hemorrhage of anus and rectum: Secondary | ICD-10-CM

## 2013-05-30 DIAGNOSIS — B20 Human immunodeficiency virus [HIV] disease: Secondary | ICD-10-CM

## 2013-05-30 DIAGNOSIS — R238 Other skin changes: Secondary | ICD-10-CM

## 2013-05-30 DIAGNOSIS — L988 Other specified disorders of the skin and subcutaneous tissue: Secondary | ICD-10-CM

## 2013-05-30 MED ORDER — HYDROCORTISONE ACETATE 25 MG RE SUPP: 25.0000 mg | Freq: Two times a day (BID) | RECTAL | Status: DC

## 2013-05-30 NOTE — ED Provider Notes (Signed)
Medical screening examination/treatment/procedure(s) were performed by non-physician practitioner and as supervising physician I was immediately available for consultation/collaboration.  Raynald Blend, MD 05/30/13 [id number]

## 2013-05-30 NOTE — ED Notes (Signed)
Pt reports at least a month of noticing clear red and bright red blood on his underwear and when he uses toilet tissue.  Also he was exposed to measles a week ago and he has itchy bumps on his hands and feet.

## 2013-05-30 NOTE — ED Provider Notes (Addendum)
History     CSN: [id number]  Arrival date & time 05/30/13  1237   First MD Initiated Contact with Patient 05/30/13 1319      Chief Complaint  Patient presents with  . Rectal Bleeding    (Consider location/radiation/quality/duration/timing/severity/associated sxs/prior treatment) HPI Comments: 22 year old male with a history of HIV, syphilis, scabies, bipolar 1 disorder, depression and other infections presents with a complaint of rectal bleeding for 2 months. He states the bleeding as light usually bright red and drains scantly and slowly onto his underwear and then on toilet paper with wiping. He denies the perception of external lesions. He responds positively to times of constipation and straining.  Second complaint is that "I have the measles". He states that he was exposed to a person that was exposed to the measles approximately one week ago. This is all conjecture.Marland Kitchen He points to approximately 8-10 flesh-colored pinpoint papules to the hand wrist and forearms. He denies fever, chills, fatigue, malaise, red eyes, runny nose, cough Or red blotchy rash.   Past Medical History  Diagnosis Date  . HIV infection   . Syphilis   . Scabies   . Bipolar 1 disorder     Past Surgical History  Procedure Laterality Date  . Rectal surgery      drainage of abscess    No family history on file.  History  Substance Use Topics  . Smoking status: Current Every Day Smoker -- 0.40 packs/day    Types: Cigarettes  . Smokeless tobacco: Never Used     Comment: 1 cig. daily  . Alcohol Use: No     Comment: monthly       Review of Systems  Constitutional: Negative.   HENT: Negative.   Respiratory: Negative.   Cardiovascular: Negative.   Gastrointestinal: Positive for constipation, blood in stool and anal bleeding. Negative for nausea, vomiting and abdominal pain.  Genitourinary: Negative.   Skin: Positive for rash.  Neurological: Negative.   Hematological: Negative for adenopathy. Does  not bruise/bleed easily.    Allergies  Review of patient's allergies indicates no known allergies.  Home Medications   Current Outpatient Rx  Name  Route  Sig  Dispense  Refill  . ARIPiprazole (ABILIFY) 15 MG tablet   Oral   Take 1 tablet (15 mg total) by mouth daily.   30 tablet   11   . azithromycin (ZITHROMAX) 600 MG tablet   Oral   Take 2 tablets (1,200 mg total) by mouth every 7 (seven) days.   8 tablet   11   . citalopram (CELEXA) 20 MG tablet   Oral   Take 20 mg by mouth daily.         . Darunavir Ethanolate (PREZISTA) 800 MG tablet   Oral   Take 1 tablet (800 mg total) by mouth daily.   30 tablet   11   . dronabinol (MARINOL) 5 MG capsule   Oral   Take 1 capsule (5 mg total) by mouth 2 (two) times daily before a meal.   60 capsule   3   . emtricitabine-tenofovir (TRUVADA) 200-300 MG per tablet   Oral   Take 1 tablet by mouth daily.   30 tablet   11   . ritonavir ([name]) 100 MG TABS   Oral   Take 1 tablet (100 mg total) by mouth daily.   30 tablet   11   . sulfamethoxazole-trimethoprim (BACTRIM DS) 800-160 MG per tablet   Oral   Take 1  tablet by mouth 3 (three) times a week.   12 tablet   11   . temazepam (RESTORIL) 15 MG capsule   Oral   Take 1 capsule (15 mg total) by mouth at bedtime as needed for sleep.   15 capsule   0   . triamcinolone ointment (KENALOG) 0.5 %   Topical   Apply topically 2 (two) times daily.   30 g   2   . hydrocortisone (ANUSOL-HC) 25 MG suppository   Rectal   Place 1 suppository (25 mg total) rectally 2 (two) times daily.   24 suppository   0     BP 128/84  Pulse 86  Temp(Src) 98.4 F (36.9 C) (Oral)  Resp 19  SpO2 98%  Physical Exam  Nursing note and vitals reviewed. Constitutional: He is oriented to person, place, and time. He appears well-nourished. No distress.  Eyes: Conjunctivae and EOM are normal.  Neck: Normal range of motion. Neck supple.  Cardiovascular: Normal rate, regular rhythm and  normal heart sounds.   Pulmonary/Chest: Breath sounds normal. No respiratory distress. He has no wheezes. He has no rales.  Abdominal: Soft. There is no tenderness.  Genitourinary: Guaiac positive stool.  External rectal exam is normal. No erythema, inflammation, external hemorrhoids or other lesions. Digital exam reveals marked tenderness of the anal ring associated with palpable soft tender worm like structures just beyond the anal ring in the anterior portion of the distal rectum. No apparent bleeding, melena. Hemocult is positive  Musculoskeletal: He exhibits no edema and no tenderness.  Neurological: He is alert and oriented to person, place, and time. He exhibits normal muscle tone.  Skin: Skin is warm.  Pinpoint flesh-colored papules to the forearms and hands as described above. No blotchy red areas.    ED Course  Procedures (including critical care time)  Labs Reviewed - No data to display No results found.   1. Rectal bleeding   2. Skin papules, generalized   3. HIV disease       MDM  Call your PCP in infectious disease doctor VanDam for a referral to gastroenterology. In the meantime take Colace 100 mg 3 times a day, no straining, and Anusol suppositories per rectum twice a day. Nothing in the rectum until okayed by the gastroenterologist. For worsening new symptoms or problems seek medical treatment promptly. Uncertain as to the etiology of the UE rash, appears most like scabies but pt st it is not.         Hayden Rasmussen, NP 05/30/13 1402 and  Hayden Rasmussen, NP 05/30/13 1403  Hayden Rasmussen, NP 06/26/13 1204

## 2013-06-03 ENCOUNTER — Ambulatory Visit: Payer: No Typology Code available for payment source

## 2013-06-04 ENCOUNTER — Ambulatory Visit (INDEPENDENT_AMBULATORY_CARE_PROVIDER_SITE_OTHER): Payer: No Typology Code available for payment source | Admitting: Infectious Disease

## 2013-06-04 ENCOUNTER — Encounter: Payer: Self-pay | Admitting: Infectious Disease

## 2013-06-04 VITALS — BP 116/80 | HR 78 | Temp 98.7°F | Ht 73.0 in | Wt 175.0 lb

## 2013-06-04 DIAGNOSIS — K649 Unspecified hemorrhoids: Secondary | ICD-10-CM

## 2013-06-04 DIAGNOSIS — B86 Scabies: Secondary | ICD-10-CM

## 2013-06-04 DIAGNOSIS — F319 Bipolar disorder, unspecified: Secondary | ICD-10-CM

## 2013-06-04 DIAGNOSIS — R21 Rash and other nonspecific skin eruption: Secondary | ICD-10-CM

## 2013-06-04 DIAGNOSIS — K625 Hemorrhage of anus and rectum: Secondary | ICD-10-CM

## 2013-06-04 DIAGNOSIS — B20 Human immunodeficiency virus [HIV] disease: Secondary | ICD-10-CM

## 2013-06-04 MED ORDER — PERMETHRIN 5 % EX CREA: TOPICAL_CREAM | CUTANEOUS | Status: DC

## 2013-06-04 NOTE — Progress Notes (Signed)
Subjective:    Patient ID: Justin Ball, male    DOB: [birth year], 22 y.o.   MRN: [id number]  HPI   22 year old with recently diagnosed HIV/AIDS whom we started on tivicay and truvada in early November (when he started taking the meds). He claims that when starting his antiretrovirals he developed lower back pain as well as pain in his left shoulder and paresthesias in his left hand. We had found nice drop Viral load and doubling of his CD4 count from 50 to 150 by January. We then changed him over to Prezista [name] and Truvada. He has been apparently VERy poorly compliant based on lack of VL response but no documented R.   He is also suffering from bipolar disorder and has been hospitalized with rx with celexa and cymbalta He has been seeing Franne Forts  is also being engaged by a [place] our. bridge counselor  He complains of problems with intermittent rectal bleeding and was seen in ED recently where they found him to be guiac positive and they advised him to be seen by a GI MD.  He finally is also complaining of pruritic rash on wrist (R) and also on left hand and in groin that is scaly and not responded to topical steroids.     Review of Systems  Constitutional: Negative for fever, chills, diaphoresis, activity change, appetite change, fatigue and unexpected weight change.  HENT: Negative for congestion, sore throat, rhinorrhea, sneezing, trouble swallowing and sinus pressure.   Eyes: Negative for photophobia and visual disturbance.  Respiratory: Negative for cough, chest tightness, shortness of breath, wheezing and stridor.   Cardiovascular: Negative for chest pain, palpitations and leg swelling.  Gastrointestinal: Positive for blood in stool. Negative for nausea, vomiting, abdominal pain, diarrhea, constipation, abdominal distention and anal bleeding.  Genitourinary: Negative for dysuria, hematuria, flank pain and difficulty urinating.  Musculoskeletal: Negative for myalgias, back  pain, joint swelling, arthralgias and gait problem.  Skin: Positive for rash. Negative for color change, pallor and wound.  Neurological: Negative for dizziness, tremors, weakness and light-headedness.  Hematological: Negative for adenopathy. Does not bruise/bleed easily.  Psychiatric/Behavioral: Positive for behavioral problems, sleep disturbance, dysphoric mood and decreased concentration. Negative for confusion, self-injury and agitation. The patient is not hyperactive.        Objective:   Physical Exam  Constitutional: He is oriented to person, place, and time. He appears well-developed and well-nourished. No distress.  HENT:  Head: Normocephalic and atraumatic.  Mouth/Throat: Oropharynx is clear and moist. No oropharyngeal exudate.  Eyes: Conjunctivae and EOM are normal. Pupils are equal, round, and reactive to light. No scleral icterus.  Neck: Normal range of motion. Neck supple. No JVD present.  Cardiovascular: Normal rate, regular rhythm and normal heart sounds.  Exam reveals no gallop and no friction rub.   No murmur heard. Pulmonary/Chest: Effort normal and breath sounds normal. No respiratory distress. He has no wheezes. He has no rales. He exhibits no tenderness.  Abdominal: He exhibits no distension and no mass. There is no tenderness. There is no rebound and no guarding.  Musculoskeletal: He exhibits no edema and no tenderness.  Lymphadenopathy:    He has no cervical adenopathy.  Neurological: He is alert and oriented to person, place, and time. He has normal reflexes. He exhibits normal muscle tone. Coordination normal.  Skin: Skin is warm and dry. Rash noted. He is not diaphoretic. No erythema. No pallor.     Psychiatric: Judgment normal. His mood appears anxious. He  is not agitated. He exhibits a depressed mood. He expresses no homicidal and no suicidal ideation. He expresses no suicidal plans and no homicidal plans.          Assessment & Plan:   HIV: recheck VL  and CD4  In one month,  continue current regimen. NEEDS TO START azithromycin. Can take TMP/SMX MWF  Bipolar disorder : contnue celexa and  cymbalta which we   Noncompliance: Mitch from [facility] engaged.   Rectal bleeding: Likely due to hemorrhoids will refer to GI  Rash: could be scabies, will give him rx for permethrin but ave since discovered not covered by [employer], working on way to get him the rx (thru [facility]?)

## 2013-06-05 ENCOUNTER — Other Ambulatory Visit: Payer: Self-pay | Admitting: Licensed Clinical Social Worker

## 2013-06-05 DIAGNOSIS — B86 Scabies: Secondary | ICD-10-CM

## 2013-06-05 MED ORDER — PERMETHRIN 1 % EX LOTN: TOPICAL_LOTION | Freq: Once | CUTANEOUS | Status: DC

## 2013-06-05 MED ORDER — PERMETHRIN 5 % EX CREA: TOPICAL_CREAM | CUTANEOUS | Status: DC

## 2013-06-19 ENCOUNTER — Ambulatory Visit: Payer: No Typology Code available for payment source

## 2013-06-19 DIAGNOSIS — F411 Generalized anxiety disorder: Secondary | ICD-10-CM

## 2013-06-19 DIAGNOSIS — F3130 Bipolar disorder, current episode depressed, mild or moderate severity, unspecified: Secondary | ICD-10-CM

## 2013-06-19 NOTE — Progress Notes (Signed)
Justin Ball came in today with his boyfriend and he seems to be doing some better, but continues to struggle with various issues.  He said he didn't think the Abilify was working.  When asked further about this, he said it didn't relax him.  I explained that that isn't the purpose of Abilify.  He and his boyfriend talked about having trouble focusing sometimes, so I provided some psycho-education on mindfulness and facilitated a 2 minute guided meditation.  They both said they liked it.  He also talked about his disability claim, but said he is working with [employer] and is considering trying to find work in lieu of pursuing disability.  Plan to meet in 4 weeks due to scheduling.

## 2013-06-26 NOTE — ED Provider Notes (Signed)
Medical screening examination/treatment/procedure(s) were performed by non-physician practitioner and as supervising physician I was immediately available for consultation/collaboration.  Raynald Blend, MD 06/26/13 1427

## 2013-07-02 ENCOUNTER — Other Ambulatory Visit: Payer: No Typology Code available for payment source

## 2013-07-02 DIAGNOSIS — B20 Human immunodeficiency virus [HIV] disease: Secondary | ICD-10-CM

## 2013-07-03 LAB — CBC WITH DIFFERENTIAL/PLATELET
Basophils Relative: 1 % (ref 0–1)
Eosinophils Absolute: 0 10*3/uL (ref 0.0–0.7)
Eosinophils Relative: 1 % (ref 0–5)
MCH: 30.6 pg (ref 26.0–34.0)
MCHC: 35.9 g/dL (ref 30.0–36.0)
MCV: 85.2 fL (ref 78.0–100.0)
Monocytes Relative: 9 % (ref 3–12)
Neutrophils Relative %: 32 % — ABNORMAL LOW (ref 43–77)
Platelets: 210 10*3/uL (ref 150–400)

## 2013-07-03 LAB — COMPLETE METABOLIC PANEL WITH GFR
Alkaline Phosphatase: 90 U/L (ref 39–117)
Creat: 1.2 mg/dL (ref 0.50–1.35)
GFR, Est African American: >89 mL/min
GFR, Est Non African American: 85 mL/min
Glucose, Bld: 79 mg/dL (ref 70–99)
Sodium: 139 mEq/L (ref 135–145)
Total Bilirubin: 0.6 mg/dL (ref 0.3–1.2)
Total Protein: 8.6 g/dL — ABNORMAL HIGH (ref 6.0–8.3)

## 2013-07-03 LAB — HIV-1 RNA QUANT-NO REFLEX-BLD
HIV 1 RNA Quant: 56583 copies/mL — ABNORMAL HIGH (ref <20)
HIV-1 RNA Quant, Log: 4.75 {Log} — ABNORMAL HIGH (ref <1.30)

## 2013-07-03 LAB — T-HELPER CELL (CD4) - (RCID CLINIC ONLY)
CD4 % Helper T Cell: 4 % — ABNORMAL LOW (ref 33–55)
CD4 T Cell Abs: 70 uL — ABNORMAL LOW (ref 400–2700)

## 2013-07-09 ENCOUNTER — Encounter: Payer: Self-pay | Admitting: Infectious Disease

## 2013-07-09 ENCOUNTER — Ambulatory Visit (INDEPENDENT_AMBULATORY_CARE_PROVIDER_SITE_OTHER): Payer: No Typology Code available for payment source | Admitting: Infectious Disease

## 2013-07-09 VITALS — BP 106/72 | HR 81 | Temp 98.4°F | Ht 73.0 in | Wt 184.0 lb

## 2013-07-09 DIAGNOSIS — G8929 Other chronic pain: Secondary | ICD-10-CM

## 2013-07-09 DIAGNOSIS — R1013 Epigastric pain: Secondary | ICD-10-CM

## 2013-07-09 DIAGNOSIS — B20 Human immunodeficiency virus [HIV] disease: Secondary | ICD-10-CM

## 2013-07-09 DIAGNOSIS — K625 Hemorrhage of anus and rectum: Secondary | ICD-10-CM

## 2013-07-09 DIAGNOSIS — Z21 Asymptomatic human immunodeficiency virus [HIV] infection status: Secondary | ICD-10-CM

## 2013-07-09 MED ORDER — ATAZANAVIR SULFATE 300 MG PO CAPS: 300.0000 mg | ORAL_CAPSULE | Freq: Every day | ORAL | Status: DC

## 2013-07-09 MED ORDER — ONDANSETRON HCL 4 MG PO TABS: 4.0000 mg | ORAL_TABLET | Freq: Three times a day (TID) | ORAL | Status: DC | PRN

## 2013-07-09 NOTE — Patient Instructions (Addendum)
Your new HIV regimen is:  Reyataz 300mg  (red and blue capsue)  [name] 100mg  white tablet  Truvada blued tablet  ALL once daily  Please also take Bactrim (TMP/SMX) once daily to prevent PCP  And  Azithromycin 2 x 600mg  tablets once weekly for MAI prevention  I am rx zofran for nausea  If you do not take the Reyataz [name] and truvada EVERY SINGLE DAY we will NOT see your viral load go down and your Immune system get stronger

## 2013-07-09 NOTE — Progress Notes (Signed)
Subjective:    Patient ID: Justin Ball, male    DOB: Dec 20, 1991, 22 y.o.   MRN: [id number]  Abdominal Pain This is a chronic problem. The current episode started more than 1 year ago. The onset quality is gradual. The problem occurs 2 to 4 times per day. The problem has been gradually worsening. The pain is located in the generalized abdominal region. The pain is at a severity of 4/10. The pain is moderate. The quality of the pain is colicky. The abdominal pain radiates to the epigastric region. Associated symptoms include hematochezia. Pertinent negatives include no arthralgias, constipation, diarrhea, dysuria, fever, hematuria, myalgias, nausea or vomiting.  Rectal Bleeding  Associated symptoms include abdominal pain and rash. Pertinent negatives include no fever, no diarrhea, no nausea, no vomiting, no hematuria, no chest pain and no coughing.    22 year old with  HIV/AIDS whom we started on tivicay and truvada in early November (when he started taking the meds). He claims that when starting his antiretrovirals he developed lower back pain as well as pain in his left shoulder and paresthesias in his left hand. We had found nice drop Viral load and doubling of his CD4 count from 50 to 150 by January. We then changed him over to Prezista [name] and Truvada. He has been apparently VERy poorly compliant based on lack of VL response but no documented R.   He is also suffering from bipolar disorder and has been hospitalized with rx with celexa and cymbalta He has been seeing Franne Forts  is also being engaged by a [place] our. bridge counselor  He complains of problems with intermittent rectal bleeding and was seen in ED recently where they found him to be guiac positive and they advised him to be seen by a GI MD.  He claims to be taking his antiretrovirals including Prezista [name] and Truvada and Tivicay (which I stopped rx), but on closer questioning it appears that his definition of taking these  medicines is much different than mine. I've told him he needs to be taking his medicines every single day to have any chance of suppressing his viral load. I've shown him and graphic documentation that his viral load has failed to be suppressed in the recent months despite his claims to be taking antiretroviral medications and the current viral load trends reflect that of a person that could also be simply not taking antiretroviral medication.  After much discussion we opted to change his regimen to Reyataz [name] and Truvada. He will need to take Bactrim for prophylaxis and azithromycin for Mycobacterium avium prophylaxis.        Review of Systems  Constitutional: Negative for fever, chills, diaphoresis, activity change, appetite change, fatigue and unexpected weight change.  HENT: Negative for congestion, sore throat, rhinorrhea, sneezing, trouble swallowing and sinus pressure.   Eyes: Negative for photophobia and visual disturbance.  Respiratory: Negative for cough, chest tightness, shortness of breath, wheezing and stridor.   Cardiovascular: Negative for chest pain, palpitations and leg swelling.  Gastrointestinal: Positive for abdominal pain, blood in stool and hematochezia. Negative for nausea, vomiting, diarrhea, constipation, abdominal distention and anal bleeding.  Genitourinary: Negative for dysuria, hematuria, flank pain and difficulty urinating.  Musculoskeletal: Negative for myalgias, back pain, joint swelling, arthralgias and gait problem.  Skin: Positive for rash. Negative for color change, pallor and wound.  Neurological: Negative for dizziness, tremors, weakness and light-headedness.  Hematological: Negative for adenopathy. Does not bruise/bleed easily.  Psychiatric/Behavioral: Positive for behavioral problems,  sleep disturbance, dysphoric mood and decreased concentration. Negative for confusion, self-injury and agitation. The patient is not hyperactive.        Objective:     Physical Exam  Constitutional: He is oriented to person, place, and time. He appears well-developed and well-nourished. No distress.  HENT:  Head: Normocephalic and atraumatic.  Mouth/Throat: Oropharynx is clear and moist. No oropharyngeal exudate.  Eyes: Conjunctivae and EOM are normal. Pupils are equal, round, and reactive to light. No scleral icterus.  Neck: Normal range of motion. Neck supple. No JVD present.  Cardiovascular: Normal rate, regular rhythm and normal heart sounds.  Exam reveals no gallop and no friction rub.   No murmur heard. Pulmonary/Chest: Effort normal and breath sounds normal. No respiratory distress. He has no wheezes. He has no rales. He exhibits no tenderness.  Abdominal: He exhibits no distension and no mass. There is tenderness. There is guarding. There is no rebound.  Musculoskeletal: He exhibits no edema and no tenderness.  Lymphadenopathy:    He has no cervical adenopathy.  Neurological: He is alert and oriented to person, place, and time. He has normal reflexes. He exhibits normal muscle tone. Coordination normal.  Skin: Skin is warm and dry. Rash noted. He is not diaphoretic. No erythema. No pallor.     Psychiatric: Judgment normal. His mood appears anxious. He is not agitated. He exhibits a depressed mood. He expresses no homicidal and no suicidal ideation. He expresses no suicidal plans and no homicidal plans.          Assessment & Plan:   HIV: Change to Reyataz [name] Truvada check a viral load , CD4 count today and in a month  NEEDS TO START azithromycin. Can take TMP/SMX MWF I spent greater than 45 minutes with the patient including greater than 50% of time in face to face counsel of the patient and in coordination of their care.   Bipolar disorder : contnue celexa and  cymbalta    Noncompliance: Mitch from [facility] engaged.   Rectal bleeding: Likely due to hemorrhoids will refer to GI  GI pain: will check labs today and order CT abdomen

## 2013-07-10 ENCOUNTER — Telehealth: Payer: Self-pay | Admitting: Licensed Clinical Social Worker

## 2013-07-10 NOTE — Telephone Encounter (Signed)
Patient did not stay for labs yesterday I will check with Dr. [name] to see if he was supposed to or if the labs were meant to be future, if so they will have to be put back in.

## 2013-07-14 NOTE — Telephone Encounter (Signed)
Yes he was supposed to and this has been something that happens nearly every time he comes in, I wanted a data point at beginning of him TRULY taking his meds every day and 1 month in

## 2013-07-15 ENCOUNTER — Ambulatory Visit: Payer: No Typology Code available for payment source

## 2013-07-15 DIAGNOSIS — F259 Schizoaffective disorder, unspecified: Secondary | ICD-10-CM

## 2013-07-15 NOTE — Progress Notes (Signed)
Justin Ball came in today with his partner and with his 22 year old nephew, whom he was babysitting today.  The child was quite pleasant and did not disrupt the session.  [name] reports that he is now taking 40 mg of Zyprexa, but is sleeping 12-14 hours a day.  He said he has applied to [employer], but worries that he will be too tired from the medication to work.  I encouraged him to go back to his doctor at [facility] and tell her how it is making him sleep too much.  He had questions about Bipolar Disorder and described how he gets manic every day for about 2 hours.  He described jumping around, wrestling with his partner, being somewhat "hyper", but not causing any harm (aside from possibly breaking the futon).  I explained that if this is not causing a problem, then it is fine.  I provided some psycho-education on mania.  Plan to meet again in 2 weeks.

## 2013-07-17 ENCOUNTER — Other Ambulatory Visit: Payer: Self-pay | Admitting: *Deleted

## 2013-07-17 DIAGNOSIS — B20 Human immunodeficiency virus [HIV] disease: Secondary | ICD-10-CM

## 2013-07-17 MED ORDER — SULFAMETHOXAZOLE-TMP DS 800-160 MG PO TABS: 1.0000 | ORAL_TABLET | ORAL | Status: DC

## 2013-07-22 ENCOUNTER — Ambulatory Visit (INDEPENDENT_AMBULATORY_CARE_PROVIDER_SITE_OTHER): Payer: No Typology Code available for payment source | Admitting: Internal Medicine

## 2013-07-22 ENCOUNTER — Encounter: Payer: Self-pay | Admitting: Internal Medicine

## 2013-07-22 VITALS — BP 111/71 | HR 86 | Temp 98.4°F | Ht 73.0 in | Wt 186.0 lb

## 2013-07-22 DIAGNOSIS — R21 Rash and other nonspecific skin eruption: Secondary | ICD-10-CM

## 2013-07-22 DIAGNOSIS — Z113 Encounter for screening for infections with a predominantly sexual mode of transmission: Secondary | ICD-10-CM

## 2013-07-22 DIAGNOSIS — B20 Human immunodeficiency virus [HIV] disease: Secondary | ICD-10-CM

## 2013-07-22 LAB — COMPLETE METABOLIC PANEL WITH GFR
ALT: 26 U/L (ref 0–53)
Albumin: 4 g/dL (ref 3.5–5.2)
Alkaline Phosphatase: 83 U/L (ref 39–117)
GFR, Est Non African American: 88 mL/min
Glucose, Bld: 63 mg/dL — ABNORMAL LOW (ref 70–99)
Potassium: 3.9 mEq/L (ref 3.5–5.3)
Sodium: 136 mEq/L (ref 135–145)
Total Protein: 8.3 g/dL (ref 6.0–8.3)

## 2013-07-22 LAB — CBC WITH DIFFERENTIAL/PLATELET
Basophils Absolute: 0 10*3/uL (ref 0.0–0.1)
Basophils Relative: 1 % (ref 0–1)
Eosinophils Absolute: 0.1 10*3/uL (ref 0.0–0.7)
Eosinophils Relative: 4 % (ref 0–5)
HCT: 41.4 % (ref 39.0–52.0)
MCH: 31.3 pg (ref 26.0–34.0)
MCHC: 36.2 g/dL — ABNORMAL HIGH (ref 30.0–36.0)
MCV: 86.3 fL (ref 78.0–100.0)
Monocytes Absolute: 0.3 10*3/uL (ref 0.1–1.0)
Monocytes Relative: 9 % (ref 3–12)
Neutro Abs: 1.5 10*3/uL — ABNORMAL LOW (ref 1.7–7.7)
RDW: 12.5 % (ref 11.5–15.5)

## 2013-07-22 MED ORDER — DARUNAVIR ETHANOLATE 800 MG PO TABS: 800.0000 mg | ORAL_TABLET | Freq: Every day | ORAL | Status: DC

## 2013-07-22 MED ORDER — HYDROXYZINE HCL 10 MG PO TABS: 10.0000 mg | ORAL_TABLET | Freq: Three times a day (TID) | ORAL | Status: DC | PRN

## 2013-07-22 NOTE — Progress Notes (Signed)
  Subjective:    Patient ID: Justin Ball, male    DOB: 31-Mar-1991, 22 y.o.   MRN: [id number]  HPI He comes in here for a work in visit. He has a long history of HIV with poor compliance and recently was started on a Reyataz with [name] and Truvada. He previously was on tivicay and then Prezista before changing to this regimen. He comes in here to 2 significant itch and rash. He equates this starting after starting Reyataz. She also has a history bed bugs. He says that he "took care of it" before. He reports daily compliance with his medication though also tells me that he has been taking tivicay and Prezista as well. He seems to be unaware that he was supposed to stop those.   Review of Systems  Constitutional: Negative for appetite change and fatigue.  HENT: Negative for sore throat and trouble swallowing.   Respiratory: Negative for cough and shortness of breath.   Cardiovascular: Negative for leg swelling.  Gastrointestinal: Negative for nausea, abdominal pain and diarrhea.  Skin: Positive for rash.       Pruritis   Neurological: Negative for dizziness and headaches.  Hematological: Negative for adenopathy.  Psychiatric/Behavioral: Negative for dysphoric mood.       Objective:   Physical Exam  Constitutional: He appears well-developed and well-nourished. No distress.  Cardiovascular: Normal rate, regular rhythm and normal heart sounds.   Skin:  He has a diffuse macular rash that are about 3-4 mm, circular and itchy.            Assessment & Plan:

## 2013-07-22 NOTE — Assessment & Plan Note (Addendum)
This rash could be from his immune response, syphilis or possibly bedbugs. He does not feel it is bedbugs because he has completed therapy and changed all his sheets from before. I will check his RPR, I also will have him go back to Prezista encase the Reyataz is part of the problem and told him if it persists he may need treatment for scabies again. I will also check his viral load today. I did discuss with him his regimen and that he needs to take only the 3 medications and not take tivicay or now not take Reyataz. I will also give him some Atarax

## 2013-07-25 LAB — HIV-1 RNA QUANT-NO REFLEX-BLD: HIV-1 RNA Quant, Log: 2.52 {Log} — ABNORMAL HIGH (ref <1.30)

## 2013-08-05 ENCOUNTER — Ambulatory Visit: Payer: No Typology Code available for payment source

## 2013-08-12 ENCOUNTER — Other Ambulatory Visit (INDEPENDENT_AMBULATORY_CARE_PROVIDER_SITE_OTHER): Payer: Self-pay

## 2013-08-12 DIAGNOSIS — B20 Human immunodeficiency virus [HIV] disease: Secondary | ICD-10-CM

## 2013-08-13 LAB — COMPLETE METABOLIC PANEL WITH GFR
ALT: 33 U/L (ref 0–53)
BUN: 14 mg/dL (ref 6–23)
CO2: 27 mEq/L (ref 19–32)
Calcium: 9.6 mg/dL (ref 8.4–10.5)
Chloride: 105 mEq/L (ref 96–112)
Creat: 0.94 mg/dL (ref 0.50–1.35)
GFR, Est African American: >89 mL/min

## 2013-08-13 LAB — CBC WITH DIFFERENTIAL/PLATELET
Eosinophils Relative: 5 % (ref 0–5)
HCT: 42.6 % (ref 39.0–52.0)
Lymphocytes Relative: 37 % (ref 12–46)
Lymphs Abs: 1.1 10*3/uL (ref 0.7–4.0)
MCV: 86.2 fL (ref 78.0–100.0)
Monocytes Absolute: 0.2 10*3/uL (ref 0.1–1.0)
RBC: 4.94 MIL/uL (ref 4.22–5.81)
WBC: 3 10*3/uL — ABNORMAL LOW (ref 4.0–10.5)

## 2013-08-13 LAB — T-HELPER CELL (CD4) - (RCID CLINIC ONLY)
CD4 % Helper T Cell: 7 % — ABNORMAL LOW (ref 33–55)
CD4 T Cell Abs: 80 uL — ABNORMAL LOW (ref 400–2700)

## 2013-08-13 LAB — HIV-1 RNA QUANT-NO REFLEX-BLD: HIV-1 RNA Quant, Log: 2.4 {Log} — ABNORMAL HIGH (ref <1.30)

## 2013-08-26 ENCOUNTER — Encounter: Payer: Self-pay | Admitting: Infectious Disease

## 2013-08-26 ENCOUNTER — Ambulatory Visit (INDEPENDENT_AMBULATORY_CARE_PROVIDER_SITE_OTHER): Payer: Self-pay | Admitting: Infectious Disease

## 2013-08-26 VITALS — BP 118/76 | HR 80 | Temp 98.9°F | Ht 73.0 in | Wt 198.5 lb

## 2013-08-26 DIAGNOSIS — F319 Bipolar disorder, unspecified: Secondary | ICD-10-CM

## 2013-08-26 DIAGNOSIS — B20 Human immunodeficiency virus [HIV] disease: Secondary | ICD-10-CM

## 2013-08-26 DIAGNOSIS — Z23 Encounter for immunization: Secondary | ICD-10-CM

## 2013-08-26 DIAGNOSIS — Z21 Asymptomatic human immunodeficiency virus [HIV] infection status: Secondary | ICD-10-CM

## 2013-08-26 DIAGNOSIS — R109 Unspecified abdominal pain: Secondary | ICD-10-CM

## 2013-08-26 LAB — COMPLETE METABOLIC PANEL WITH GFR
Albumin: 4 g/dL (ref 3.5–5.2)
Alkaline Phosphatase: 81 U/L (ref 39–117)
BUN: 14 mg/dL (ref 6–23)
CO2: 30 mEq/L (ref 19–32)
GFR, Est African American: >89 mL/min
GFR, Est Non African American: >89 mL/min
Glucose, Bld: 85 mg/dL (ref 70–99)
Potassium: 4.4 mEq/L (ref 3.5–5.3)

## 2013-08-26 LAB — PHOSPHORUS: Phosphorus: 2.7 mg/dL (ref 2.3–4.6)

## 2013-08-26 MED ORDER — AZITHROMYCIN 600 MG PO TABS: 1200.0000 mg | ORAL_TABLET | ORAL | Status: DC

## 2013-08-26 MED ORDER — DARUNAVIR ETHANOLATE 800 MG PO TABS: 800.0000 mg | ORAL_TABLET | Freq: Every day | ORAL | Status: DC

## 2013-08-26 MED ORDER — SULFAMETHOXAZOLE-TMP DS 800-160 MG PO TABS: 1.0000 | ORAL_TABLET | ORAL | Status: DC

## 2013-08-26 MED ORDER — RITONAVIR 100 MG PO TABS: 100.0000 mg | ORAL_TABLET | Freq: Every day | ORAL | Status: DC

## 2013-08-26 MED ORDER — EMTRICITABINE-TENOFOVIR DF 200-300 MG PO TABS: 1.0000 | ORAL_TABLET | Freq: Every day | ORAL | Status: DC

## 2013-08-26 NOTE — Patient Instructions (Addendum)
yOU NEED TO TAKE  PREZISTA RED PILL WITH   NORIVIR WHITE PILL TOGETHER   WITH   TRUVADA EVERY SINGLE DAY FOR YOUR HIV NO MATTER WHAT  YOU NEED TO TAKE BACTRIM THREE TIMES A WEEK TO PREVENT PCP  YOU NEED TO TAKE AZITHROMYCIN TWO TABLETS ONCE A WEEK TO PREVENT MAC INFECTION

## 2013-08-26 NOTE — Progress Notes (Signed)
  [facility] - Pharmacist    HPI: Justin Ball is a 22 y.o. male here for follow-up.  Allergies: No Known Allergies  Vitals: Temp: 98.9 F (37.2 C) (09/02 1357) Temp src: Oral (09/02 1357) BP: 118/76 mmHg (09/02 1357) Pulse Rate: 80 (09/02 1357)  Past Medical History: Past Medical History  Diagnosis Date  . HIV infection   . Syphilis   . Scabies   . Bipolar 1 disorder     Social History: History   Social History  . Marital Status: Single    Spouse Name: N/A    Number of Children: N/A  . Years of Education: N/A   Social History Main Topics  . Smoking status: Current Every Day Smoker -- 0.40 packs/day    Types: Cigarettes  . Smokeless tobacco: Never Used     Comment: 2 ciggs daily, trying to quit  . Alcohol Use: No     Comment: monthly   . Drug Use: No     Comment: past history   . Sexual Activity: Yes    Partners: Male    Birth Control/ Protection: Condom     Comment: irregular condom use; educated   Other Topics Concern  . None   Social History Narrative  . None    Previous Regimen: Atazanavir/ritonavir, Dolutegravir, Truvada  Current Regimen: As prescribed: Darunavir/ritonavir, Truvada He is taking: Darunavir, Atazanavir, Truvada; sometimes dolutegravir if he runs out of one of his other medications  Labs: HIV 1 RNA Quant (copies/mL)  Date Value  08/12/2013 253*  07/22/2013 333*  07/02/2013 86578*     CD4 T Cell Abs (cmm)  Date Value  08/12/2013 80*  07/22/2013 80*  07/02/2013 70*     Hep B S Ab (no units)  Date Value  10/10/2012 NEG      Hepatitis B Surface Ag (no units)  Date Value  10/10/2012 POSITIVE*     HCV Ab (no units)  Date Value  10/10/2012 NEGATIVE     CrCl: Estimated Creatinine Clearance: 139.3 ml/min (by C-G formula based on Cr of 0.94).  Lipids:    Component Value Date/Time   CHOL 160 04/08/2013 1036   TRIG 92 04/08/2013 1036   HDL 30* 04/08/2013 1036   CHOLHDL 5.3 04/08/2013 1036   VLDL 18 04/08/2013 1036   LDLCALC 112* 04/08/2013 1036    Assessment: HIV - his adherence is poor based on my conversation with him.  He misses doses 2-3 times in a 2 week period, and remains unclear on what regimen he is to be taking.    Recommendations: I reviewed his correct regimen with him today: Darunavir/ritonavir, Truvada Recommended that he bring his medications to every visit for review and further education. Adherence counseling performed.  Sallee Provencal, Pharm.D., BCPS, AAHIVP Clinical Infectious Disease Pharmacist [facility] 08/26/2013, 2:51 PM

## 2013-08-26 NOTE — Progress Notes (Signed)
Subjective:    Patient ID: Justin Ball, male    DOB: 20-Aug-1991, 22 y.o.   MRN: [id number]  Abdominal Pain This is a chronic problem. The current episode started more than 1 year ago. The onset quality is gradual. The problem occurs 2 to 4 times per day. The problem has been gradually worsening. The pain is located in the generalized abdominal region. The pain is at a severity of 4/10. The pain is moderate. The quality of the pain is colicky. The abdominal pain radiates to the epigastric region. Pertinent negatives include no arthralgias, constipation, dysuria or myalgias.    22 year old with  HIV/AIDS whom we started on tivicay and truvada in early November (when he started taking the meds). He claimED THEN  that when starting his antiretrovirals he developed lower back pain as well as pain in his left shoulder and paresthesias in his left hand.   We had found nice drop Viral load and doubling of his CD4 count from 50 to 150 by January. We then changed him over to Prezista [name] and Truvada. He has been apparently VERy poorly compliant based on lack of VL response but no documented R.   He is also suffering from bipolar disorder and has been hospitalized with rx with celexa and cymbalta He has been seeing Franne Forts  is also being engaged by a [place] our. bridge counselor  I then changed his regimen to [place] [name] and Truvada but he went on to take these drugs WITH his prezista and he saw Dr. Luciana Axe and was changed back to P/N/T. SInce then the pt dropped VL to 200s but turns out he has been taking Prezista, Truvada and Reyataz but no norvir  He claims to be out of mx meds though his scripts have refills. He is still having problems with rectal pain and discharge, clear colored at times with blood as well. along with abdominal pain. Rash is about the same and steroids have imporved it very little.       Review of Systems  Constitutional: Negative for chills, diaphoresis, activity  change, appetite change, fatigue and unexpected weight change.  HENT: Negative for congestion, sore throat, rhinorrhea, sneezing, trouble swallowing and sinus pressure.   Eyes: Negative for photophobia and visual disturbance.  Respiratory: Negative for chest tightness, shortness of breath, wheezing and stridor.   Cardiovascular: Negative for palpitations and leg swelling.  Gastrointestinal: Positive for blood in stool. Negative for constipation, abdominal distention and anal bleeding.  Genitourinary: Negative for dysuria, flank pain and difficulty urinating.  Musculoskeletal: Negative for myalgias, back pain, joint swelling, arthralgias and gait problem.  Skin: Negative for color change, pallor and wound.  Neurological: Negative for dizziness, tremors, weakness and light-headedness.  Hematological: Negative for adenopathy. Does not bruise/bleed easily.  Psychiatric/Behavioral: Positive for sleep disturbance, dysphoric mood and decreased concentration. Negative for behavioral problems, confusion, self-injury and agitation. The patient is not hyperactive.        Objective:   Physical Exam  Constitutional: He is oriented to person, place, and time. He appears well-developed and well-nourished. No distress.  HENT:  Head: Normocephalic and atraumatic.  Mouth/Throat: Oropharynx is clear and moist. No oropharyngeal exudate.  Eyes: Conjunctivae and EOM are normal. Pupils are equal, round, and reactive to light. No scleral icterus.  Neck: Normal range of motion. Neck supple. No JVD present.  Cardiovascular: Normal rate, regular rhythm and normal heart sounds.  Exam reveals no gallop and no friction rub.   No murmur heard.  Pulmonary/Chest: Effort normal and breath sounds normal. No respiratory distress. He has no wheezes. He has no rales. He exhibits no tenderness.  Abdominal: He exhibits no distension and no mass. There is tenderness. There is guarding. There is no rebound.  Genitourinary:      Musculoskeletal: He exhibits no edema and no tenderness.  Lymphadenopathy:    He has no cervical adenopathy.  Neurological: He is alert and oriented to person, place, and time. He has normal reflexes. He exhibits normal muscle tone. Coordination normal.  Skin: Skin is warm and dry. Rash noted. He is not diaphoretic. No erythema. No pallor.     Psychiatric: Judgment normal. His mood appears anxious. He is not agitated. He exhibits a depressed mood. He expresses no homicidal and no suicidal ideation. He expresses no suicidal plans and no homicidal plans.          Assessment & Plan:   HIV: KEEP on Prezista, [name] Truvada check a viral load , CD4 count today and in a month    Asked him to bring his meds with him next time.   Pt seen also by Lucianne Muss D and counseled.   I spent greater than 45 minutes with the patient including greater than 50% of time in face to face counsel of the patient and in coordination of their care.   Bipolar disorder : contnue celexa and  cymbalta    Noncompliance: Mitch from [facility] engaged.   Rectal bleeding, dc : Likely due to hemorrhoids will refer to GI  Gi pain: chronic and stable: had tried to order ct but dont think he showed for scan

## 2013-08-29 LAB — HIV-1 GENOTYPR PLUS

## 2013-09-02 ENCOUNTER — Ambulatory Visit: Payer: Self-pay

## 2013-09-02 DIAGNOSIS — F319 Bipolar disorder, unspecified: Secondary | ICD-10-CM

## 2013-09-02 NOTE — Progress Notes (Signed)
I met with Justin Ball and his partner, Justin Ball today and they both talked about various issues they are facing.  Justin Ball talked about applying for jobs, but has had no success and believes the background check creates a problem for him.  He also reported that he is working on getting an appointment with [facility] and I encouraged him to follow up with this, citing some success with another client.  He also reported that [employer] has changed his medication from Abilify to [place] and he says he is having no issues with it.  I talked some about ways to improve mood, encouraging them both to be active at least 30 minutes a day.

## 2013-09-24 ENCOUNTER — Other Ambulatory Visit: Payer: Self-pay

## 2013-09-25 ENCOUNTER — Other Ambulatory Visit: Payer: Self-pay

## 2013-09-30 ENCOUNTER — Other Ambulatory Visit (INDEPENDENT_AMBULATORY_CARE_PROVIDER_SITE_OTHER): Payer: Self-pay

## 2013-09-30 ENCOUNTER — Ambulatory Visit: Payer: Self-pay

## 2013-09-30 DIAGNOSIS — F319 Bipolar disorder, unspecified: Secondary | ICD-10-CM

## 2013-09-30 DIAGNOSIS — Z21 Asymptomatic human immunodeficiency virus [HIV] infection status: Secondary | ICD-10-CM

## 2013-09-30 DIAGNOSIS — B20 Human immunodeficiency virus [HIV] disease: Secondary | ICD-10-CM

## 2013-09-30 NOTE — Progress Notes (Signed)
Justin Ball reported that he has a job at [employer] and starts next week.  He reported that he has been more irritable lately.  He said he was changed from Abilify to [place] last month.  I asked him to bring up with the doctor at [facility] the fact that he has been irritable. He also has been sleeping 10-11 hours/night and napping during the day.  I encouraged him to begin exercising some now before he starts working at [employer] because of the physical nature of the work there.  He also talked about not wanting to take his medications some days because they make him sick.  He agreed to talk to his HIV doctor [name]) about this next week.  I praised him for going out and getting a job.  Plan to meet in a month.

## 2013-10-01 LAB — T-HELPER CELL (CD4) - (RCID CLINIC ONLY): CD4 T Cell Abs: 100 /uL — ABNORMAL LOW (ref 400–2700)

## 2013-10-02 LAB — HIV-1 RNA QUANT-NO REFLEX-BLD: HIV-1 RNA Quant, Log: 4.79 {Log} — ABNORMAL HIGH (ref <1.30)

## 2013-10-06 ENCOUNTER — Ambulatory Visit (INDEPENDENT_AMBULATORY_CARE_PROVIDER_SITE_OTHER): Payer: Self-pay | Admitting: Infectious Disease

## 2013-10-06 ENCOUNTER — Encounter: Payer: Self-pay | Admitting: Infectious Disease

## 2013-10-06 VITALS — BP 115/82 | HR 72 | Temp 98.7°F | Wt 200.2 lb

## 2013-10-06 DIAGNOSIS — Z7251 High risk heterosexual behavior: Secondary | ICD-10-CM

## 2013-10-06 DIAGNOSIS — Z21 Asymptomatic human immunodeficiency virus [HIV] infection status: Secondary | ICD-10-CM

## 2013-10-06 DIAGNOSIS — B20 Human immunodeficiency virus [HIV] disease: Secondary | ICD-10-CM

## 2013-10-06 DIAGNOSIS — F319 Bipolar disorder, unspecified: Secondary | ICD-10-CM

## 2013-10-06 MED ORDER — DRONABINOL 5 MG PO CAPS: 5.0000 mg | ORAL_CAPSULE | Freq: Two times a day (BID) | ORAL | Status: DC

## 2013-10-06 MED ORDER — PROMETHAZINE HCL 50 MG PO TABS: 50.0000 mg | ORAL_TABLET | ORAL | Status: DC

## 2013-10-06 MED ORDER — LURASIDONE HCL 20 MG PO TABS: 200.0000 mg | ORAL_TABLET | Freq: Every day | ORAL | Status: DC

## 2013-10-06 MED ORDER — TEMAZEPAM 15 MG PO CAPS: 15.0000 mg | ORAL_CAPSULE | Freq: Every evening | ORAL | Status: DC | PRN

## 2013-10-06 NOTE — Patient Instructions (Addendum)
EVERY DAY TAKE  ONE HALF TO ONE WHOLE PHENERGAN (PROMETHAZINE) 25 TO 50MG  ONE HOUR BEFORE TAKING YOUR HIV MEDS  THEN  IF STILL  HAVING NAUSEA MAY REPEAT DOSES OF THE PHENERGAN EVERY 6 HOURS   TO CLARIFY YOUR HIV REGIMEN IS  PREZISTA 800MG  RED TABLET ONCE DAILY  WITH  [name] 100MG  WHITE TABLET ONCE DAILY  WITH  TRUVADA BLUE TABLET ONCE DAILY  I WANT YOU TO MEET WITH MICHELLE TURNER NEXT Tuesday AND THEN COME BACK FOR BLOODWORK IN November AND VISIT WITH ID CLINIC MD AT THAT TIME

## 2013-10-06 NOTE — Progress Notes (Signed)
Subjective:    Patient ID: Justin Ball, male    DOB: [birth year], 22 y.o.   MRN: [id number]  HPI  22 year old with  HIV/AIDS whom we started on tivicay and truvada in early November (when he started taking the meds). He claimED THEN  that when starting his antiretrovirals he developed lower back pain as well as pain in his left shoulder and paresthesias in his left hand.   We had found nice drop Viral load and doubling of his CD4 count from 50 to 150 by January. We then changed him over to Prezista [name] and Truvada. He has been apparently VERy poorly compliant based on lack of VL response but no documented R.   He is also suffering from bipolar disorder and has been hospitalized with rx with celexa and cymbalta He has been seeing Franne Forts  is also being engaged by a [place] our. bridge counselor  I then changed his regimen to Reyataz [name] and Truvada but he went on to take these drugs WITH his prezista and he saw Dr. Luciana Axe and was changed back to P/N/T. 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         Review of Systems  Constitutional: Negative for chills, diaphoresis, activity change, appetite change, fatigue and unexpected weight change.  HENT: Negative for congestion, rhinorrhea, sinus pressure, sneezing, sore throat and trouble swallowing.   Eyes: Negative for photophobia and visual disturbance.  Respiratory: Negative for chest tightness, shortness of breath, wheezing and stridor.   Cardiovascular: Negative for palpitations and leg swelling.  Gastrointestinal: Negative for abdominal distention and anal bleeding.  Genitourinary: Negative for flank pain and difficulty urinating.  Musculoskeletal: Negative for back pain, gait problem and joint swelling.  Skin: Negative for color change, pallor and  wound.  Neurological: Negative for dizziness, tremors, weakness and light-headedness.  Hematological: Negative for adenopathy. Does not bruise/bleed easily.  Psychiatric/Behavioral: Positive for sleep disturbance, dysphoric mood and decreased concentration. Negative for behavioral problems, confusion, self-injury and agitation. The patient is not hyperactive.        Objective:   Physical Exam  Constitutional: He is oriented to person, place, and time. He appears well-developed and well-nourished. No distress.  HENT:  Head: Normocephalic and atraumatic.  Mouth/Throat: Oropharynx is clear and moist. No oropharyngeal exudate.  Eyes: Conjunctivae and EOM are normal. Pupils are equal, round, and reactive to light. No scleral icterus.  Neck: Normal range of motion. Neck supple. No JVD present.  Cardiovascular: Normal rate, regular rhythm and normal heart sounds.  Exam reveals no gallop and no friction rub.   No murmur heard. Pulmonary/Chest: Effort normal and breath sounds normal. No respiratory distress. He has no wheezes. He has no rales. He exhibits no tenderness.  Abdominal: He exhibits no distension and no mass. There is tenderness. There is guarding. There is no rebound.  Musculoskeletal: He exhibits no edema and no tenderness.  Lymphadenopathy:    He has no cervical adenopathy.  Neurological: He is alert and oriented to person, place, and time. He has normal reflexes. He exhibits normal muscle tone. Coordination normal.  Skin: Skin is warm and dry. Rash noted. He is not diaphoretic. No erythema. No pallor.  Psychiatric: Judgment normal. His mood appears anxious. He is not agitated. He exhibits a depressed mood.  He expresses no homicidal and no suicidal ideation. He expresses no suicidal plans and no homicidal plans.          Assessment & Plan:   HIV: PREMEDICATE with phergan prior to  Prezista, [name] Truvada. Yes he is only on TWO active drugs but I am confident that IF he actually  WILL take his meds reliably he can suppress with this regimen. If he cannot again show [place] he can do this we may need to resort to more drastic measures such as taking him off ARV, referring to palliative care etc.   Pt should see  Estella Husk PHarm D next Tuesday then labs in mid Nov and fu with me   I spent greater than 45 minutes with the patient including greater than 50% of time in face to face counsel of the patient and in coordination of their care.   Bipolar disorder : contnue celexa and  lutuda rx by Vesta Mixer   Noncompliance: Mitch from [facility] engaged.   Nausea, vomiting: asked him to take phenergan as premedication and q 6 hours prn  High risk sexual behavior: YET another reason to have him please take his meds. His partner (HIV positive) is reportedly on meds and suppressed

## 2013-10-14 ENCOUNTER — Ambulatory Visit: Payer: Self-pay

## 2013-11-04 ENCOUNTER — Other Ambulatory Visit (INDEPENDENT_AMBULATORY_CARE_PROVIDER_SITE_OTHER): Payer: Self-pay

## 2013-11-04 ENCOUNTER — Ambulatory Visit: Payer: Self-pay

## 2013-11-04 DIAGNOSIS — Z113 Encounter for screening for infections with a predominantly sexual mode of transmission: Secondary | ICD-10-CM

## 2013-11-04 DIAGNOSIS — B20 Human immunodeficiency virus [HIV] disease: Secondary | ICD-10-CM

## 2013-11-05 LAB — CBC WITH DIFFERENTIAL/PLATELET
Basophils Relative: 0 % (ref 0–1)
Eosinophils Absolute: 0.2 10*3/uL (ref 0.0–0.7)
HCT: 42.9 % (ref 39.0–52.0)
Lymphs Abs: 1.6 10*3/uL (ref 0.7–4.0)
MCH: 31.2 pg (ref 26.0–34.0)
MCHC: 36.6 g/dL — ABNORMAL HIGH (ref 30.0–36.0)
Neutro Abs: 1 10*3/uL — ABNORMAL LOW (ref 1.7–7.7)
Neutrophils Relative %: 35 % — ABNORMAL LOW (ref 43–77)
Platelets: 224 10*3/uL (ref 150–400)
RBC: 5.04 MIL/uL (ref 4.22–5.81)
WBC: 3 10*3/uL — ABNORMAL LOW (ref 4.0–10.5)

## 2013-11-05 LAB — COMPLETE METABOLIC PANEL WITH GFR
ALT: 28 U/L (ref 0–53)
Alkaline Phosphatase: 90 U/L (ref 39–117)
Creat: 1.07 mg/dL (ref 0.50–1.35)
Sodium: 142 mEq/L (ref 135–145)
Total Bilirubin: 0.8 mg/dL (ref 0.3–1.2)
Total Protein: 8.7 g/dL — ABNORMAL HIGH (ref 6.0–8.3)

## 2013-11-05 LAB — T-HELPER CELL (CD4) - (RCID CLINIC ONLY)
CD4 % Helper T Cell: 6 % — ABNORMAL LOW (ref 33–55)
CD4 T Cell Abs: 90 /uL — ABNORMAL LOW (ref 400–2700)

## 2013-11-06 LAB — HIV-1 RNA QUANT-NO REFLEX-BLD: HIV-1 RNA Quant, Log: 2.78 {Log} — ABNORMAL HIGH (ref <1.30)

## 2013-11-10 ENCOUNTER — Other Ambulatory Visit: Payer: Self-pay

## 2013-12-08 ENCOUNTER — Ambulatory Visit: Payer: Self-pay | Admitting: Infectious Disease

## 2013-12-15 ENCOUNTER — Ambulatory Visit: Payer: Self-pay | Admitting: Infectious Disease

## 2014-01-12 ENCOUNTER — Telehealth: Payer: Self-pay | Admitting: Infectious Disease

## 2014-01-12 NOTE — Telephone Encounter (Signed)
Justin Ball, is really actually FINALLY doing a good job of taking his meds. VL down to 600  Can we bring him back later this week for recheck VL, MD visit and ADAP renewal

## 2014-01-13 NOTE — Telephone Encounter (Signed)
Called patient per Dr Daiva EvesVan Dam note to try and get him in for a visit. Was unable to reach the patient and left a message on him voice mail to call the office asap.

## 2014-01-14 ENCOUNTER — Other Ambulatory Visit: Payer: Self-pay | Admitting: *Deleted

## 2014-01-14 DIAGNOSIS — B20 Human immunodeficiency virus [HIV] disease: Secondary | ICD-10-CM

## 2014-01-14 NOTE — Telephone Encounter (Signed)
Patient will get labs, RW/ADAP renewal 01/19/14.

## 2014-01-15 NOTE — Telephone Encounter (Signed)
Perfect. If we have a no show we can see him too

## 2014-01-19 ENCOUNTER — Other Ambulatory Visit: Payer: Self-pay

## 2014-01-19 ENCOUNTER — Ambulatory Visit: Payer: Self-pay

## 2014-01-30 ENCOUNTER — Other Ambulatory Visit: Payer: Self-pay | Admitting: *Deleted

## 2014-01-30 DIAGNOSIS — B20 Human immunodeficiency virus [HIV] disease: Secondary | ICD-10-CM

## 2014-01-30 DIAGNOSIS — Z23 Encounter for immunization: Secondary | ICD-10-CM

## 2014-01-30 DIAGNOSIS — R109 Unspecified abdominal pain: Secondary | ICD-10-CM

## 2014-01-30 DIAGNOSIS — F319 Bipolar disorder, unspecified: Secondary | ICD-10-CM

## 2014-01-30 MED ORDER — SULFAMETHOXAZOLE-TMP DS 800-160 MG PO TABS: 1.0000 | ORAL_TABLET | ORAL | Status: DC

## 2014-01-30 MED ORDER — DARUNAVIR ETHANOLATE 800 MG PO TABS: 800.0000 mg | ORAL_TABLET | Freq: Every day | ORAL | Status: DC

## 2014-01-30 MED ORDER — EMTRICITABINE-TENOFOVIR DF 200-300 MG PO TABS: 1.0000 | ORAL_TABLET | Freq: Every day | ORAL | Status: DC

## 2014-01-30 MED ORDER — RITONAVIR 100 MG PO TABS: 100.0000 mg | ORAL_TABLET | Freq: Every day | ORAL | Status: DC

## 2014-02-10 ENCOUNTER — Ambulatory Visit: Payer: Self-pay

## 2014-03-03 ENCOUNTER — Other Ambulatory Visit: Payer: Self-pay

## 2014-03-31 ENCOUNTER — Other Ambulatory Visit: Payer: Self-pay

## 2014-06-15 ENCOUNTER — Ambulatory Visit: Payer: Self-pay | Admitting: Infectious Disease

## 2014-06-24 ENCOUNTER — Ambulatory Visit: Payer: Self-pay | Admitting: Infectious Disease

## 2014-06-25 ENCOUNTER — Ambulatory Visit: Payer: Self-pay | Admitting: Infectious Disease

## 2014-09-09 ENCOUNTER — Emergency Department (HOSPITAL_COMMUNITY)
Admission: EM | Admit: 2014-09-09 | Discharge: 2014-09-09 | Disposition: A | Discharge status: Home / Self Care | Payer: Self-pay | Source: Home / Self Care | Attending: Family Medicine | Admitting: Family Medicine

## 2014-09-09 ENCOUNTER — Encounter (HOSPITAL_COMMUNITY): Payer: Self-pay | Admitting: Emergency Medicine

## 2014-09-09 ENCOUNTER — Emergency Department (HOSPITAL_COMMUNITY)
Admission: EM | Admit: 2014-09-09 | Discharge: 2014-09-09 | Disposition: A | Discharge status: Home / Self Care | Payer: Self-pay | Attending: Emergency Medicine | Admitting: Emergency Medicine

## 2014-09-09 DIAGNOSIS — R195 Other fecal abnormalities: Secondary | ICD-10-CM | POA: Insufficient documentation

## 2014-09-09 DIAGNOSIS — G709 Myoneural disorder, unspecified: Secondary | ICD-10-CM | POA: Insufficient documentation

## 2014-09-09 DIAGNOSIS — F319 Bipolar disorder, unspecified: Secondary | ICD-10-CM | POA: Insufficient documentation

## 2014-09-09 DIAGNOSIS — R1013 Epigastric pain: Secondary | ICD-10-CM | POA: Insufficient documentation

## 2014-09-09 DIAGNOSIS — R61 Generalized hyperhidrosis: Secondary | ICD-10-CM | POA: Insufficient documentation

## 2014-09-09 DIAGNOSIS — Z8619 Personal history of other infectious and parasitic diseases: Secondary | ICD-10-CM | POA: Insufficient documentation

## 2014-09-09 DIAGNOSIS — F411 Generalized anxiety disorder: Secondary | ICD-10-CM | POA: Insufficient documentation

## 2014-09-09 DIAGNOSIS — F17200 Nicotine dependence, unspecified, uncomplicated: Secondary | ICD-10-CM | POA: Insufficient documentation

## 2014-09-09 DIAGNOSIS — R197 Diarrhea, unspecified: Secondary | ICD-10-CM | POA: Insufficient documentation

## 2014-09-09 DIAGNOSIS — Z79899 Other long term (current) drug therapy: Secondary | ICD-10-CM | POA: Insufficient documentation

## 2014-09-09 DIAGNOSIS — Z21 Asymptomatic human immunodeficiency virus [HIV] infection status: Secondary | ICD-10-CM | POA: Insufficient documentation

## 2014-09-09 DIAGNOSIS — R6883 Chills (without fever): Secondary | ICD-10-CM | POA: Insufficient documentation

## 2014-09-09 DIAGNOSIS — Z872 Personal history of diseases of the skin and subcutaneous tissue: Secondary | ICD-10-CM | POA: Insufficient documentation

## 2014-09-09 DIAGNOSIS — Z792 Long term (current) use of antibiotics: Secondary | ICD-10-CM | POA: Insufficient documentation

## 2014-09-09 DIAGNOSIS — R112 Nausea with vomiting, unspecified: Secondary | ICD-10-CM | POA: Insufficient documentation

## 2014-09-09 DIAGNOSIS — K921 Melena: Secondary | ICD-10-CM | POA: Insufficient documentation

## 2014-09-09 LAB — CBC WITH DIFFERENTIAL/PLATELET
BASOS PCT: 0 % (ref 0–1)
Basophils Absolute: 0 10*3/uL (ref 0.0–0.1)
EOS ABS: 0.1 10*3/uL (ref 0.0–0.7)
EOS PCT: 3 % (ref 0–5)
HCT: 40.4 % (ref 39.0–52.0)
Hemoglobin: 14.2 g/dL (ref 13.0–17.0)
Lymphocytes Relative: 41 % (ref 12–46)
Lymphs Abs: 1 10*3/uL (ref 0.7–4.0)
MCH: 30.7 pg (ref 26.0–34.0)
MCHC: 35.1 g/dL (ref 30.0–36.0)
MCV: 87.4 fL (ref 78.0–100.0)
Monocytes Absolute: 0.2 10*3/uL (ref 0.1–1.0)
Monocytes Relative: 9 % (ref 3–12)
NEUTROS PCT: 47 % (ref 43–77)
Neutro Abs: 1.2 10*3/uL — ABNORMAL LOW (ref 1.7–7.7)
Platelets: 222 10*3/uL (ref 150–400)
RBC: 4.62 MIL/uL (ref 4.22–5.81)
RDW: 12.3 % (ref 11.5–15.5)
WBC: 2.5 10*3/uL — ABNORMAL LOW (ref 4.0–10.5)

## 2014-09-09 LAB — COMPREHENSIVE METABOLIC PANEL
ALBUMIN: 3.7 g/dL (ref 3.5–5.2)
ALK PHOS: 109 U/L (ref 39–117)
ALT: 33 U/L (ref 0–53)
ANION GAP: 9 (ref 5–15)
AST: 39 U/L — ABNORMAL HIGH (ref 0–37)
BUN: 9 mg/dL (ref 6–23)
CALCIUM: 9.1 mg/dL (ref 8.4–10.5)
CO2: 27 mEq/L (ref 19–32)
Chloride: 102 mEq/L (ref 96–112)
Creatinine, Ser: 0.86 mg/dL (ref 0.50–1.35)
GFR calc Af Amer: >90 mL/min (ref >90)
GFR calc non Af Amer: >90 mL/min (ref >90)
Glucose, Bld: 87 mg/dL (ref 70–99)
POTASSIUM: 3.4 meq/L — AB (ref 3.7–5.3)
Sodium: 138 mEq/L (ref 137–147)
Total Bilirubin: 0.8 mg/dL (ref 0.3–1.2)
Total Protein: 8.1 g/dL (ref 6.0–8.3)

## 2014-09-09 LAB — POC OCCULT BLOOD, ED: Fecal Occult Bld: POSITIVE — AB

## 2014-09-09 LAB — URINALYSIS, ROUTINE W REFLEX MICROSCOPIC
GLUCOSE, UA: NEGATIVE mg/dL
Hgb urine dipstick: NEGATIVE
Ketones, ur: NEGATIVE mg/dL
LEUKOCYTES UA: NEGATIVE
NITRITE: NEGATIVE
PH: 7 (ref 5.0–8.0)
Protein, ur: NEGATIVE mg/dL
Specific Gravity, Urine: 1.019 (ref 1.005–1.030)
Urobilinogen, UA: 4 mg/dL — ABNORMAL HIGH (ref 0.0–1.0)

## 2014-09-09 LAB — LIPASE, BLOOD: Lipase: 18 U/L (ref 11–59)

## 2014-09-09 MED ORDER — SODIUM CHLORIDE 0.9 % IV BOLUS (SEPSIS)
1000.0000 mL | Freq: Once | INTRAVENOUS | Status: AC
  Administered 2014-09-09: 1000 mL via INTRAVENOUS

## 2014-09-09 MED ORDER — ONDANSETRON 4 MG PO TBDP
8.0000 mg | ORAL_TABLET | Freq: Once | ORAL | Status: AC
  Administered 2014-09-09: 8 mg via ORAL
  Filled 2014-09-09: qty 2

## 2014-09-09 NOTE — ED Provider Notes (Signed)
CSN: [id number]     Arrival date & time 09/09/14  1019 History   First MD Initiated Contact with Patient 09/09/14 1044     Chief Complaint  Patient presents with  . Abdominal Pain  . Nausea     (Consider location/radiation/quality/duration/timing/severity/associated sxs/prior Treatment) HPI 23 year old male presents with intermittent abdominal pain and diarrhea over the last 3 weeks. He has a history of HIV, most recent CD4 count was 90 in November 2014. States his abdominal pain feels like a pulling in his epigastrium and goes away after he has a bowel movement. He's been having 4 bowel movements per day and they've been loose with some yellow mucus and occasional blood. Denies fevers. Has been having night sweats. Has been having nausea and intermittent vomiting. At this time he is having moderate epigastric abdominal pain. He has been taking his anti-virals as prescribed. Pepto bismal helps with the pain but is transient. Declines wanting anything for pain currently.  Past Medical History  Diagnosis Date  . HIV infection   . Syphilis   . Scabies   . Bipolar 1 disorder   . Neuromuscular disorder   . Substance abuse   . Depression   . Anxiety    Past Surgical History  Procedure Laterality Date  . Rectal surgery      drainage of abscess   Family History  Problem Relation Age of Onset  . Hypertension Mother    History  Substance Use Topics  . Smoking status: Current Every Day Smoker -- 0.40 packs/day    Types: Cigarettes  . Smokeless tobacco: Never Used     Comment: 1 ciggs daily, trying to quit  . Alcohol Use: No     Comment: monthly     Review of Systems  Constitutional: Positive for chills and diaphoresis. Negative for fever.  Gastrointestinal: Positive for nausea, vomiting, abdominal pain, diarrhea and blood in stool.  All other systems reviewed and are negative.     Allergies  Review of patient's allergies indicates no known allergies.  Home Medications    Prior to Admission medications   Medication Sig Start Date End Date Taking? Authorizing Provider  citalopram (CELEXA) 20 MG tablet Take 20 mg by mouth daily.    Historical Provider, MD  Darunavir Ethanolate (PREZISTA) 800 MG tablet Take 1 tablet (800 mg total) by mouth daily. 01/30/14   Randall Hiss, MD  dronabinol (MARINOL) 5 MG capsule Take 1 capsule (5 mg total) by mouth 2 (two) times daily before a meal. 10/06/13   Randall Hiss, MD  emtricitabine-tenofovir (TRUVADA) 200-300 MG per tablet Take 1 tablet by mouth daily. 01/30/14   Randall Hiss, MD  hydrocortisone (ANUSOL-HC) 25 MG suppository Place 1 suppository (25 mg total) rectally 2 (two) times daily. 05/30/13   Hayden Rasmussen, NP  hydrOXYzine (ATARAX/VISTARIL) 10 MG tablet Take 1 tablet (10 mg total) by mouth every 8 (eight) hours as needed for itching. 07/22/13   Gardiner Barefoot, MD  Lurasidone HCl (LATUDA) 20 MG TABS Take 10 tablets (200 mg total) by mouth daily. 10/06/13   Randall Hiss, MD  promethazine (PHENERGAN) 50 MG tablet Take 1 tablet (50 mg total) by mouth as directed. 10/06/13   Randall Hiss, MD  ritonavir ([name]) 100 MG TABS tablet Take 1 tablet (100 mg total) by mouth daily. 01/30/14   Randall Hiss, MD  sulfamethoxazole-trimethoprim (BACTRIM DS) 800-160 MG per tablet Take 1 tablet by mouth 3 (three)  times a week. 01/30/14   Randall Hiss, MD  temazepam (RESTORIL) 15 MG capsule Take 1 capsule (15 mg total) by mouth at bedtime as needed for sleep. 10/06/13   Randall Hiss, MD  triamcinolone ointment (KENALOG) 0.5 % Apply topically 2 (two) times daily. 01/06/13   Randall Hiss, MD   BP 119/69  Pulse 76  Temp(Src) 98.1 F (36.7 C) (Oral)  Resp 12  SpO2 99% Physical Exam  Nursing note and vitals reviewed. Constitutional: He is oriented to person, place, and time. He appears well-developed and well-nourished.  HENT:  Head: Normocephalic and atraumatic.  Right Ear: External ear  normal.  Left Ear: External ear normal.  Nose: Nose normal.  Eyes: Right eye exhibits no discharge. Left eye exhibits no discharge.  Neck: Neck supple.  Cardiovascular: Normal rate, regular rhythm, normal heart sounds and intact distal pulses.   Pulmonary/Chest: Effort normal.  Abdominal: Soft. There is tenderness in the epigastric area.  Genitourinary: Rectal exam shows no external hemorrhoid. Guaiac positive stool.  Small amount of brown stool on rectal exam. No gross blood.  Neurological: He is alert and oriented to person, place, and time.  Skin: Skin is warm and dry.    ED Course  Procedures (including critical care time) Labs Review Labs Reviewed  CBC WITH DIFFERENTIAL - Abnormal; Notable for the following:    WBC 2.5 (*)    Neutro Abs 1.2 (*)    All other components within normal limits  COMPREHENSIVE METABOLIC PANEL - Abnormal; Notable for the following:    Potassium 3.4 (*)    AST 39 (*)    All other components within normal limits  URINALYSIS, ROUTINE W REFLEX MICROSCOPIC - Abnormal; Notable for the following:    Color, Urine AMBER (*)    Bilirubin Urine SMALL (*)    Urobilinogen, UA 4.0 (*)    All other components within normal limits  POC OCCULT BLOOD, ED - Abnormal; Notable for the following:    Fecal Occult Bld POSITIVE (*)    All other components within normal limits  LIPASE, BLOOD  GI PATHOGEN PANEL BY PCR, STOOL    Imaging Review No results found.   EKG Interpretation None      MDM   Final diagnoses:  Diarrhea    Patient is well appearing, no distress. Has upper abdominal tenderness but no focal right lower quadrant or lower abdominal pain or tenderness. Previous urgent care note indicates that he had lower abdominal tenderness but this is not reproducible by me. Given his HIV status there is concern for infectious source of his diarrhea. I discussed this case with the ID on call, Dr. Ninetta Lights, who recommends a stool pathogen panel but would not  give antibiotics. He states that followup with his infectious disease doctors appropriate and they can followup on cultures.    Audree Camel, MD 09/09/14 [phone]

## 2014-09-09 NOTE — ED Provider Notes (Signed)
CSN: [id number]     Arrival date & time 09/09/14  1324 History   First MD Initiated Contact with Patient 09/09/14 [phone]     Chief Complaint  Patient presents with  . Abdominal Pain   (Consider location/radiation/quality/duration/timing/severity/associated sxs/prior Treatment) Patient is a 23 y.o. male presenting with abdominal pain.  Abdominal Pain Pain location:  Periumbilical, epigastric and RUQ Pain quality: sharp   Pain radiates to:  Does not radiate Pain severity:  Moderate Onset quality:  Sudden Duration:  3 weeks Timing:  Constant (worse when not having bowel movements) Relieved by:  Bowel activity Exacerbated by: not having bowel movements. Ineffective treatments: Pepto Bismol. Associated symptoms: chills, diarrhea (bloody), nausea and vomiting   Associated symptoms: no fever and no hematemesis   Risk factors comment:  History of HIV. Recent sick contact   Past Medical History  Diagnosis Date  . HIV infection   . Syphilis   . Scabies   . Bipolar 1 disorder   . Neuromuscular disorder   . Substance abuse   . Depression   . Anxiety    Past Surgical History  Procedure Laterality Date  . Rectal surgery      drainage of abscess   Family History  Problem Relation Age of Onset  . Hypertension Mother    History  Substance Use Topics  . Smoking status: Current Every Day Smoker -- 0.40 packs/day    Types: Cigarettes  . Smokeless tobacco: Never Used     Comment: 1 ciggs daily, trying to quit  . Alcohol Use: No     Comment: monthly     Review of Systems  Constitutional: Positive for chills. Negative for fever.  Gastrointestinal: Positive for nausea, vomiting, abdominal pain and diarrhea (bloody). Negative for hematemesis.    Allergies  Review of patient's allergies indicates no known allergies.  Home Medications   Prior to Admission medications   Medication Sig Start Date End Date Taking? Authorizing Provider  Darunavir Ethanolate (PREZISTA) 800 MG tablet  Take 1 tablet (800 mg total) by mouth daily. 01/30/14  Yes Randall Hiss, MD  emtricitabine-tenofovir (TRUVADA) 200-300 MG per tablet Take 1 tablet by mouth daily. 01/30/14  Yes Randall Hiss, MD  Lurasidone HCl (LATUDA) 20 MG TABS Take 10 tablets (200 mg total) by mouth daily. 10/06/13  Yes Randall Hiss, MD  citalopram (CELEXA) 20 MG tablet Take 20 mg by mouth daily.    Historical Provider, MD  dronabinol (MARINOL) 5 MG capsule Take 1 capsule (5 mg total) by mouth 2 (two) times daily before a meal. 10/06/13   Randall Hiss, MD  hydrocortisone (ANUSOL-HC) 25 MG suppository Place 1 suppository (25 mg total) rectally 2 (two) times daily. 05/30/13   Hayden Rasmussen, NP  hydrOXYzine (ATARAX/VISTARIL) 10 MG tablet Take 1 tablet (10 mg total) by mouth every 8 (eight) hours as needed for itching. 07/22/13   Gardiner Barefoot, MD  promethazine (PHENERGAN) 50 MG tablet Take 1 tablet (50 mg total) by mouth as directed. 10/06/13   Randall Hiss, MD  ritonavir ([name]) 100 MG TABS tablet Take 1 tablet (100 mg total) by mouth daily. 01/30/14   Randall Hiss, MD  sulfamethoxazole-trimethoprim (BACTRIM DS) 800-160 MG per tablet Take 1 tablet by mouth 3 (three) times a week. 01/30/14   Randall Hiss, MD  temazepam (RESTORIL) 15 MG capsule Take 1 capsule (15 mg total) by mouth at bedtime as needed for sleep. 10/06/13  Randall Hiss, MD  triamcinolone ointment (KENALOG) 0.5 % Apply topically 2 (two) times daily. 01/06/13   Randall Hiss, MD   BP 129/81  Pulse 88  Temp(Src) 98.3 F (36.8 C) (Oral)  Resp 16  SpO2 98% Physical Exam  Constitutional: He appears well-developed and well-nourished. No distress.  Abdominal: Soft. Normal appearance and bowel sounds are normal. He exhibits no distension. There is no hepatosplenomegaly. There is tenderness in the right lower quadrant, epigastric area, periumbilical area and left upper quadrant. There is guarding. There is no rigidity and  no rebound. No hernia.    ED Course  Procedures (including critical care time) Labs Review Labs Reviewed - No data to display  Imaging Review No results found.   MDM   1. Bloody diarrhea   2. Epigastric pain    Patient with history of HIV, AIDS and syphilis. Last documented CD4 count of 90. Last viral load of 605. Patient states he is adherent with medication regimen of Truvada and Prezista. Concern for HIV associated infection causing current symptoms vs gastritis vs pancreatitis. Patient requires further evaluation not available in this medical setting. Will transfer patient to ED for further evaluation. Patient understands and agrees with plan. Stable for discharge to private vehicle for transfer to the ED.    Jacquelin Hawking, MD 09/09/14 1016

## 2014-09-09 NOTE — Discharge Instructions (Signed)
Bloody Diarrhea Bloody diarrhea can be caused by many different conditions. Most of the time bloody diarrhea is the result of food poisoning or minor infections. Bloody diarrhea usually improves over 2 to 3 days of rest and fluid replacement. Other conditions that can cause bloody diarrhea include:  Internal bleeding.  Infection.  Diseases of the bowel and colon. Internal bleeding from an ulcer or bowel disease can be severe and requires hospital care or even surgery. DIAGNOSIS  To find out what is wrong your caregiver may check your:  Stool.  Blood.  Results from a test that looks inside the body (endoscopy). TREATMENT   Get plenty of rest.  Drink enough water and fluids to keep your urine clear or pale yellow.  Do not smoke.  Solid foods and dairy products should be avoided until your illness improves.  As you improve, slowly return to a regular diet with easily-digested foods first. Examples are:  Bananas.  Rice.  Toast.  Crackers. You should only need these for about 2 days before adding more normal foods to your diet.  Avoid spicy or fatty foods as well as caffeine and alcohol for several days.  Medicine to control cramping and diarrhea can relieve symptoms but may prolong some cases of bloody diarrhea. Antibiotics can speed recovery from diarrhea due to some bacterial infections. Call your caregiver if diarrhea does not get better in 3 days. SEEK MEDICAL CARE IF:   You do not improve after 3 days.  Your diarrhea improves but your stool appears black. SEEK IMMEDIATE MEDICAL CARE IF:   You become extremely weak or faint.  You become very sweaty.  You have increased pain or bleeding.  You develop repeated vomiting.  You vomit and you see blood or the vomit looks black in color.  You have a fever. Document Released: 12/11/2005 Document Revised: 03/04/2012 Document Reviewed: 11/12/2009 [facility] Patient Information 2015 [place], [place]. This information is  not intended to replace advice given to you by your health care provider. Make sure you discuss any questions you have with your health care provider.     Diarrhea Diarrhea is frequent loose and watery bowel movements. It can cause you to feel weak and dehydrated. Dehydration can cause you to become tired and thirsty, have a dry mouth, and have decreased urination that often is dark yellow. Diarrhea is a sign of another problem, most often an infection that will not last long. In most cases, diarrhea typically lasts 2-3 days. However, it can last longer if it is a sign of something more serious. It is important to treat your diarrhea as directed by your caregiver to lessen or prevent future episodes of diarrhea. CAUSES  Some common causes include:  Gastrointestinal infections caused by viruses, bacteria, or parasites.  Food poisoning or food allergies.  Certain medicines, such as antibiotics, chemotherapy, and laxatives.  Artificial sweeteners and fructose.  Digestive disorders. HOME CARE INSTRUCTIONS  Ensure adequate fluid intake (hydration): Have 1 cup (8 oz) of fluid for each diarrhea episode. Avoid fluids that contain simple sugars or sports drinks, fruit juices, whole milk products, and sodas. Your urine should be clear or pale yellow if you are drinking enough fluids. Hydrate with an oral rehydration solution that you can purchase at pharmacies, retail stores, and online. You can prepare an oral rehydration solution at home by mixing the following ingredients together:   - tsp table salt.   tsp baking soda.   tsp salt substitute containing potassium chloride.  1  tablespoons sugar.  1 L (34 oz) of water.  Certain foods and beverages may increase the speed at which food moves through the gastrointestinal (GI) tract. These foods and beverages should be avoided and include:  Caffeinated and alcoholic beverages.  High-fiber foods, such as raw fruits and vegetables, nuts,  seeds, and whole grain breads and cereals.  Foods and beverages sweetened with sugar alcohols, such as xylitol, sorbitol, and mannitol.  Some foods may be well tolerated and may help thicken stool including:  Starchy foods, such as rice, toast, pasta, low-sugar cereal, oatmeal, grits, baked potatoes, crackers, and bagels.  Bananas.  Applesauce.  Add probiotic-rich foods to help increase healthy bacteria in the GI tract, such as yogurt and fermented milk products.  Wash your hands well after each diarrhea episode.  Only take over-the-counter or prescription medicines as directed by your caregiver.  Take a warm bath to relieve any burning or pain from frequent diarrhea episodes. SEEK IMMEDIATE MEDICAL CARE IF:   You are unable to keep fluids down.  You have persistent vomiting.  You have blood in your stool, or your stools are black and tarry.  You do not urinate in 6-8 hours, or there is only a small amount of very dark urine.  You have abdominal pain that increases or localizes.  You have weakness, dizziness, confusion, or light-headedness.  You have a severe headache.  Your diarrhea gets worse or does not get better.  You have a fever or persistent symptoms for more than 2-3 days.  You have a fever and your symptoms suddenly get worse. MAKE SURE YOU:   Understand these instructions.  Will watch your condition.  Will get help right away if you are not doing well or get worse. Document Released: 12/01/2002 Document Revised: 04/27/2014 Document Reviewed: 08/18/2012 [facility] Patient Information 2015 [place], [place]. This information is not intended to replace advice given to you by your health care provider. Make sure you discuss any questions you have with your health care provider.

## 2014-09-09 NOTE — ED Notes (Signed)
tx by private vehicle; pt declined shuttle service.

## 2014-09-09 NOTE — ED Notes (Addendum)
Pt unable to give a stool sample here and his ride is waiting for him. Dr. Criss Alvine stated that he could give the sample and bring it to his infectious disease doctor. Pt verbalized understanding and has his specimen cup with him to provide the sample.

## 2014-09-09 NOTE — ED Provider Notes (Signed)
Justin Ball is a 23 y.o. male who presents to Urgent Care today for abdominal pain. Patient has a three-week history of abdominal pain associated with nausea vomiting and diarrhea. Diarrhea is predominant and sometimes bloody. He denies any fevers or chills. He is able to eating drink. Patient does have a history of HIV with a last CD4 count of 90 in November of 2014. He states that he is taking his medication. Patient tried Pepto-Bismol which did not help.   Past Medical History  Diagnosis Date  . HIV infection   . Syphilis   . Scabies   . Bipolar 1 disorder   . Neuromuscular disorder   . Substance abuse   . Depression   . Anxiety    History  Substance Use Topics  . Smoking status: Current Every Day Smoker -- 0.40 packs/day    Types: Cigarettes  . Smokeless tobacco: Never Used     Comment: 1 ciggs daily, trying to quit  . Alcohol Use: No     Comment: monthly    ROS as above Medications: No current facility-administered medications for this encounter.   Current Outpatient Prescriptions  Medication Sig Dispense Refill  . Darunavir Ethanolate (PREZISTA) 800 MG tablet Take 1 tablet (800 mg total) by mouth daily.  30 tablet  11  . emtricitabine-tenofovir (TRUVADA) 200-300 MG per tablet Take 1 tablet by mouth daily.  30 tablet  11  . Lurasidone HCl (LATUDA) 20 MG TABS Take 10 tablets (200 mg total) by mouth daily.  1 tablet  0  . citalopram (CELEXA) 20 MG tablet Take 20 mg by mouth daily.      Marland Kitchen dronabinol (MARINOL) 5 MG capsule Take 1 capsule (5 mg total) by mouth 2 (two) times daily before a meal.  60 capsule  3  . hydrocortisone (ANUSOL-HC) 25 MG suppository Place 1 suppository (25 mg total) rectally 2 (two) times daily.  24 suppository  0  . hydrOXYzine (ATARAX/VISTARIL) 10 MG tablet Take 1 tablet (10 mg total) by mouth every 8 (eight) hours as needed for itching.  30 tablet  0  . promethazine (PHENERGAN) 50 MG tablet Take 1 tablet (50 mg total) by mouth as directed.  120 tablet   4  . ritonavir ([name]) 100 MG TABS tablet Take 1 tablet (100 mg total) by mouth daily.  30 tablet  11  . sulfamethoxazole-trimethoprim (BACTRIM DS) 800-160 MG per tablet Take 1 tablet by mouth 3 (three) times a week.  12 tablet  11  . temazepam (RESTORIL) 15 MG capsule Take 1 capsule (15 mg total) by mouth at bedtime as needed for sleep.  15 capsule  0  . triamcinolone ointment (KENALOG) 0.5 % Apply topically 2 (two) times daily.  30 g  2    Exam:  BP 129/81  Pulse 88  Temp(Src) 98.3 F (36.8 C) (Oral)  Resp 16  SpO2 98% Gen: Well NAD HEENT: EOMI,  MMM Lungs: Normal work of breathing. CTABL Heart: RRR no MRG Abd: NABS, tender palpation upper left quadrant with minimal guarding.  Exts: Brisk capillary refill, warm and well perfused.   No results found for this or any previous visit (from the past 24 hour(s)). No results found.  Assessment and Plan: 23 y.o. male with bloody diarrhea associated with abdominal pain in the setting of poorly controlled HIV. Plan to transfer patient to the emergency department for evaluation and management. Patient will travel via private vehicle.   This patient was also seen independently by resident  physician Jodean Lima. Please see his note for further details.  Discussed warning signs or symptoms. Please see discharge instructions. Patient expresses understanding.      Rodolph Bong, MD 09/09/14 [phone]

## 2014-09-09 NOTE — ED Notes (Signed)
C/o abd pain onset 3 weeks; gradually getting worse Sx include v/n/d; vomited x3 yest and having loose stools x4-5 days Denies fevers, urinary sx Alert, no signs of acute distress.

## 2014-09-09 NOTE — ED Notes (Signed)
Pt seen at urgent care then sent here for further evaluation. Pt c/o right lower quadrant abd pain x 3 weeks, also having n/v/d for 3 weeks. sts about 2 weeks ago he started having night sweats and productive cough with green mucus. Denies sob. Nad, skin warm and dry, resp e/u.

## 2014-09-10 NOTE — ED Provider Notes (Signed)
Medical screening examination/treatment/procedure(s) were performed by a resident physician or non-physician practitioner and as the supervising physician I was immediately available for consultation/collaboration.  Clementeen Graham, MD    Rodolph Bong, MD 09/10/14 [phone]

## 2014-10-12 ENCOUNTER — Telehealth: Payer: Self-pay | Admitting: *Deleted

## 2014-10-12 NOTE — Telephone Encounter (Signed)
Pt has not refilled HIV rxes since Feb. 2015.  Last MD appt 09/2013.  Bridge Counselor Referral.  Detectable viral load

## 2014-12-28 ENCOUNTER — Emergency Department (HOSPITAL_COMMUNITY): Payer: Self-pay | Admitting: Anesthesiology

## 2014-12-28 ENCOUNTER — Encounter (HOSPITAL_COMMUNITY): Admission: EM | Disposition: A | Discharge status: Home / Self Care | Payer: Self-pay | Source: Home / Self Care

## 2014-12-28 ENCOUNTER — Ambulatory Visit (HOSPITAL_COMMUNITY): Admission: RE | Admit: 2014-12-28 | Payer: Self-pay | Source: Ambulatory Visit | Admitting: Surgery

## 2014-12-28 ENCOUNTER — Encounter (HOSPITAL_COMMUNITY): Payer: Self-pay | Admitting: Emergency Medicine

## 2014-12-28 ENCOUNTER — Inpatient Hospital Stay (HOSPITAL_COMMUNITY)
Admission: EM | Admit: 2014-12-28 | Discharge: 2014-12-30 | DRG: 344 | Disposition: A | Discharge status: Home / Self Care | Payer: Self-pay | Attending: Surgery | Admitting: Surgery

## 2014-12-28 DIAGNOSIS — Z79899 Other long term (current) drug therapy: Secondary | ICD-10-CM

## 2014-12-28 DIAGNOSIS — F17210 Nicotine dependence, cigarettes, uncomplicated: Secondary | ICD-10-CM | POA: Diagnosis present

## 2014-12-28 DIAGNOSIS — K611 Rectal abscess: Secondary | ICD-10-CM | POA: Diagnosis present

## 2014-12-28 DIAGNOSIS — F419 Anxiety disorder, unspecified: Secondary | ICD-10-CM | POA: Diagnosis present

## 2014-12-28 DIAGNOSIS — A630 Anogenital (venereal) warts: Secondary | ICD-10-CM | POA: Diagnosis present

## 2014-12-28 DIAGNOSIS — B20 Human immunodeficiency virus [HIV] disease: Secondary | ICD-10-CM | POA: Diagnosis present

## 2014-12-28 DIAGNOSIS — Z21 Asymptomatic human immunodeficiency virus [HIV] infection status: Secondary | ICD-10-CM | POA: Diagnosis present

## 2014-12-28 DIAGNOSIS — Z91199 Patient's noncompliance with other medical treatment and regimen due to unspecified reason: Secondary | ICD-10-CM

## 2014-12-28 DIAGNOSIS — Z9119 Patient's noncompliance with other medical treatment and regimen: Secondary | ICD-10-CM

## 2014-12-28 DIAGNOSIS — Z9114 Patient's other noncompliance with medication regimen: Secondary | ICD-10-CM | POA: Diagnosis present

## 2014-12-28 DIAGNOSIS — F319 Bipolar disorder, unspecified: Secondary | ICD-10-CM | POA: Diagnosis present

## 2014-12-28 DIAGNOSIS — K614 Intrasphincteric abscess: Principal | ICD-10-CM | POA: Diagnosis present

## 2014-12-28 HISTORY — PX: EXAMINATION UNDER ANESTHESIA: SHX1540

## 2014-12-28 HISTORY — PX: INCISION AND DRAINAGE PERIRECTAL ABSCESS: SHX1804

## 2014-12-28 LAB — CBC WITH DIFFERENTIAL/PLATELET
Basophils Absolute: 0 10*3/uL (ref 0.0–0.1)
Basophils Relative: 0 % (ref 0–1)
EOS PCT: 2 % (ref 0–5)
Eosinophils Absolute: 0.1 10*3/uL (ref 0.0–0.7)
HCT: 47.5 % (ref 39.0–52.0)
Hemoglobin: 15 g/dL (ref 13.0–17.0)
LYMPHS ABS: 1.2 10*3/uL (ref 0.7–4.0)
LYMPHS PCT: 31 % (ref 12–46)
MCH: 29.5 pg (ref 26.0–34.0)
MCHC: 31.6 g/dL (ref 30.0–36.0)
MCV: 93.3 fL (ref 78.0–100.0)
MONO ABS: 0.3 10*3/uL (ref 0.1–1.0)
Monocytes Relative: 7 % (ref 3–12)
Neutro Abs: 2.4 10*3/uL (ref 1.7–7.7)
Neutrophils Relative %: 60 % (ref 43–77)
Platelets: 206 10*3/uL (ref 150–400)
RBC: 5.09 MIL/uL (ref 4.22–5.81)
RDW: 13.7 % (ref 11.5–15.5)
WBC: 3.9 10*3/uL — AB (ref 4.0–10.5)

## 2014-12-28 LAB — I-STAT CHEM 8, ED
BUN: 11 mg/dL (ref 6–23)
CALCIUM ION: 1.19 mmol/L (ref 1.12–1.23)
Chloride: 102 mEq/L (ref 96–112)
Creatinine, Ser: 0.8 mg/dL (ref 0.50–1.35)
GLUCOSE: 63 mg/dL — AB (ref 70–99)
HCT: 52 % (ref 39.0–52.0)
Hemoglobin: 17.7 g/dL — ABNORMAL HIGH (ref 13.0–17.0)
Potassium: 3.4 mmol/L — ABNORMAL LOW (ref 3.5–5.1)
Sodium: 145 mmol/L (ref 135–145)
TCO2: 26 mmol/L (ref 0–100)

## 2014-12-28 SURGERY — INCISION AND DRAINAGE, ABSCESS, PERIRECTAL
Anesthesia: General | Site: Rectum

## 2014-12-28 MED ORDER — LORAZEPAM 2 MG/ML IJ SOLN: 0.5000 mg | Freq: Three times a day (TID) | INTRAMUSCULAR | Status: DC | PRN

## 2014-12-28 MED ORDER — SODIUM CHLORIDE 0.9 % IJ SOLN
INTRAMUSCULAR | Status: DC | PRN
  Administered 2014-12-28: 10 mL

## 2014-12-28 MED ORDER — PHENOL 1.4 % MT LIQD
2.0000 | OROMUCOSAL | Status: DC | PRN
  Filled 2014-12-28: qty 177

## 2014-12-28 MED ORDER — SODIUM CHLORIDE 0.9 % IJ SOLN: 3.0000 mL | Freq: Two times a day (BID) | INTRAMUSCULAR | Status: DC

## 2014-12-28 MED ORDER — KETOROLAC TROMETHAMINE 15 MG/ML IJ SOLN
INTRAMUSCULAR | Status: DC | PRN
  Administered 2014-12-28: 30 mg via INTRAVENOUS

## 2014-12-28 MED ORDER — HYDROMORPHONE HCL 1 MG/ML IJ SOLN: 0.2500 mg | INTRAMUSCULAR | Status: DC | PRN

## 2014-12-28 MED ORDER — SODIUM CHLORIDE 0.9 % IV SOLN: 250.0000 mL | INTRAVENOUS | Status: DC | PRN

## 2014-12-28 MED ORDER — FENTANYL CITRATE 0.05 MG/ML IJ SOLN
INTRAMUSCULAR | Status: AC
  Filled 2014-12-28: qty 2

## 2014-12-28 MED ORDER — PROMETHAZINE HCL 25 MG/ML IJ SOLN: 6.2500 mg | INTRAMUSCULAR | Status: DC | PRN

## 2014-12-28 MED ORDER — DIBUCAINE 1 % RE OINT
TOPICAL_OINTMENT | RECTAL | Status: AC
  Filled 2014-12-28: qty 28

## 2014-12-28 MED ORDER — HYDROXYZINE HCL 10 MG PO TABS
10.0000 mg | ORAL_TABLET | Freq: Three times a day (TID) | ORAL | Status: DC | PRN
  Administered 2014-12-28: 10 mg via ORAL
  Filled 2014-12-28 (×2): qty 1

## 2014-12-28 MED ORDER — IBUPROFEN 200 MG PO TABS
400.0000 mg | ORAL_TABLET | Freq: Four times a day (QID) | ORAL | Status: DC | PRN
  Administered 2014-12-29: 600 mg via ORAL
  Administered 2014-12-29: 800 mg via ORAL
  Filled 2014-12-28: qty 3
  Filled 2014-12-28: qty 4

## 2014-12-28 MED ORDER — FENTANYL CITRATE 0.05 MG/ML IJ SOLN
INTRAMUSCULAR | Status: DC | PRN
  Administered 2014-12-28: 100 ug via INTRAVENOUS
  Administered 2014-12-28 (×2): 50 ug via INTRAVENOUS

## 2014-12-28 MED ORDER — LIDOCAINE HCL (PF) 2 % IJ SOLN
INTRAMUSCULAR | Status: DC | PRN
  Administered 2014-12-28: 20 mg via INTRADERMAL

## 2014-12-28 MED ORDER — HYDROMORPHONE HCL 1 MG/ML IJ SOLN
0.5000 mg | INTRAMUSCULAR | Status: DC | PRN
  Administered 2014-12-28 – 2014-12-29 (×3): 2 mg via INTRAVENOUS
  Filled 2014-12-28 (×3): qty 2

## 2014-12-28 MED ORDER — SODIUM CHLORIDE 0.9 % IJ SOLN: 3.0000 mL | INTRAMUSCULAR | Status: DC | PRN

## 2014-12-28 MED ORDER — ACETAMINOPHEN 325 MG PO TABS
325.0000 mg | ORAL_TABLET | Freq: Four times a day (QID) | ORAL | Status: DC | PRN
  Administered 2014-12-29 (×2): 650 mg via ORAL
  Filled 2014-12-28 (×2): qty 2

## 2014-12-28 MED ORDER — METOPROLOL TARTRATE 1 MG/ML IV SOLN
5.0000 mg | Freq: Four times a day (QID) | INTRAVENOUS | Status: DC | PRN
  Filled 2014-12-28: qty 5

## 2014-12-28 MED ORDER — PROMETHAZINE HCL 25 MG/ML IJ SOLN
6.2500 mg | INTRAMUSCULAR | Status: DC | PRN
  Filled 2014-12-28: qty 1

## 2014-12-28 MED ORDER — METOPROLOL TARTRATE 12.5 MG HALF TABLET
12.5000 mg | ORAL_TABLET | Freq: Two times a day (BID) | ORAL | Status: DC | PRN
  Filled 2014-12-28: qty 1

## 2014-12-28 MED ORDER — BUPIVACAINE HCL (PF) 0.25 % IJ SOLN
INTRAMUSCULAR | Status: AC
  Filled 2014-12-28: qty 30

## 2014-12-28 MED ORDER — MENTHOL 3 MG MT LOZG
1.0000 | LOZENGE | OROMUCOSAL | Status: DC | PRN
  Filled 2014-12-28: qty 9

## 2014-12-28 MED ORDER — LIP MEDEX EX OINT
1.0000 "application " | TOPICAL_OINTMENT | Freq: Two times a day (BID) | CUTANEOUS | Status: DC
  Administered 2014-12-28 – 2014-12-30 (×4): 1 via TOPICAL
  Filled 2014-12-28 (×2): qty 7

## 2014-12-28 MED ORDER — ONDANSETRON 8 MG/NS 50 ML IVPB
8.0000 mg | Freq: Four times a day (QID) | INTRAVENOUS | Status: DC | PRN
  Filled 2014-12-28: qty 8

## 2014-12-28 MED ORDER — DIBUCAINE 1 % RE OINT
TOPICAL_OINTMENT | RECTAL | Status: DC | PRN
  Administered 2014-12-28: 1 via RECTAL

## 2014-12-28 MED ORDER — SODIUM CHLORIDE 0.9 % IJ SOLN
INTRAMUSCULAR | Status: AC
  Filled 2014-12-28: qty 10

## 2014-12-28 MED ORDER — METRONIDAZOLE IN NACL 5-0.79 MG/ML-% IV SOLN
500.0000 mg | Freq: Four times a day (QID) | INTRAVENOUS | Status: DC
  Administered 2014-12-28 – 2014-12-30 (×8): 500 mg via INTRAVENOUS
  Filled 2014-12-28 (×8): qty 100

## 2014-12-28 MED ORDER — LIDOCAINE HCL (CARDIAC) 20 MG/ML IV SOLN
INTRAVENOUS | Status: AC
  Filled 2014-12-28: qty 5

## 2014-12-28 MED ORDER — LACTATED RINGERS IV BOLUS (SEPSIS): 1000.0000 mL | Freq: Three times a day (TID) | INTRAVENOUS | Status: DC | PRN

## 2014-12-28 MED ORDER — DEXTROSE 5 % IV SOLN
2.0000 g | INTRAVENOUS | Status: DC
  Administered 2014-12-28 – 2014-12-29 (×2): 2 g via INTRAVENOUS
  Filled 2014-12-28 (×2): qty 2

## 2014-12-28 MED ORDER — PROPOFOL 10 MG/ML IV BOLUS
INTRAVENOUS | Status: AC
  Filled 2014-12-28: qty 20

## 2014-12-28 MED ORDER — MIDAZOLAM HCL 5 MG/5ML IJ SOLN
INTRAMUSCULAR | Status: DC | PRN
  Administered 2014-12-28: 2 mg via INTRAVENOUS

## 2014-12-28 MED ORDER — TRAMADOL HCL 50 MG PO TABS: 50.0000 mg | ORAL_TABLET | Freq: Every day | ORAL | Status: DC

## 2014-12-28 MED ORDER — MEPERIDINE HCL 50 MG/ML IJ SOLN
6.2500 mg | INTRAMUSCULAR | Status: DC | PRN
  Administered 2014-12-28: 12.5 mg via INTRAVENOUS

## 2014-12-28 MED ORDER — METRONIDAZOLE IN NACL 5-0.79 MG/ML-% IV SOLN
INTRAVENOUS | Status: AC
  Filled 2014-12-28: qty 100

## 2014-12-28 MED ORDER — TRAMADOL HCL 50 MG PO TABS: 50.0000 mg | ORAL_TABLET | Freq: Four times a day (QID) | ORAL | Status: DC | PRN

## 2014-12-28 MED ORDER — HEPARIN SODIUM (PORCINE) 5000 UNIT/ML IJ SOLN
5000.0000 [IU] | Freq: Three times a day (TID) | INTRAMUSCULAR | Status: DC
  Administered 2014-12-29 – 2014-12-30 (×4): 5000 [IU] via SUBCUTANEOUS
  Filled 2014-12-28 (×7): qty 1

## 2014-12-28 MED ORDER — SODIUM CHLORIDE 0.9 % IV SOLN
INTRAVENOUS | Status: DC
  Administered 2014-12-29 (×2): via INTRAVENOUS
  Administered 2014-12-30: 75 mL via INTRAVENOUS

## 2014-12-28 MED ORDER — KETOROLAC TROMETHAMINE 30 MG/ML IJ SOLN
INTRAMUSCULAR | Status: AC
  Filled 2014-12-28: qty 1

## 2014-12-28 MED ORDER — PSYLLIUM 95 % PO PACK
1.0000 | PACK | Freq: Two times a day (BID) | ORAL | Status: DC
  Administered 2014-12-29 – 2014-12-30 (×3): 1 via ORAL
  Filled 2014-12-28 (×4): qty 1

## 2014-12-28 MED ORDER — DEXTROSE 5 % IV SOLN
INTRAVENOUS | Status: AC
  Filled 2014-12-28: qty 2

## 2014-12-28 MED ORDER — MIDAZOLAM HCL 2 MG/2ML IJ SOLN
INTRAMUSCULAR | Status: AC
Start: 2014-12-28 — End: 2014-12-28
  Filled 2014-12-28: qty 2

## 2014-12-28 MED ORDER — BUPIVACAINE LIPOSOME 1.3 % IJ SUSP
20.0000 mL | Freq: Once | INTRAMUSCULAR | Status: AC
  Administered 2014-12-28: 20 mL
  Filled 2014-12-28: qty 20

## 2014-12-28 MED ORDER — LACTATED RINGERS IV SOLN
INTRAVENOUS | Status: DC | PRN
  Administered 2014-12-28: 19:00:00 via INTRAVENOUS

## 2014-12-28 MED ORDER — LACTATED RINGERS IV SOLN: INTRAVENOUS | Status: DC

## 2014-12-28 MED ORDER — SUCCINYLCHOLINE CHLORIDE 20 MG/ML IJ SOLN
INTRAMUSCULAR | Status: DC | PRN
  Administered 2014-12-28: 100 mg via INTRAVENOUS

## 2014-12-28 MED ORDER — 0.9 % SODIUM CHLORIDE (POUR BTL) OPTIME
TOPICAL | Status: DC | PRN
  Administered 2014-12-28: 1000 mL

## 2014-12-28 MED ORDER — MAGIC MOUTHWASH
15.0000 mL | Freq: Four times a day (QID) | ORAL | Status: DC | PRN
  Filled 2014-12-28: qty 15

## 2014-12-28 MED ORDER — SACCHAROMYCES BOULARDII 250 MG PO CAPS
250.0000 mg | ORAL_CAPSULE | Freq: Two times a day (BID) | ORAL | Status: DC
  Administered 2014-12-29 – 2014-12-30 (×3): 250 mg via ORAL
  Filled 2014-12-28 (×4): qty 1

## 2014-12-28 MED ORDER — MEPERIDINE HCL 50 MG/ML IJ SOLN
INTRAMUSCULAR | Status: AC
  Filled 2014-12-28: qty 1

## 2014-12-28 MED ORDER — ONDANSETRON HCL 4 MG PO TABS
4.0000 mg | ORAL_TABLET | Freq: Four times a day (QID) | ORAL | Status: DC | PRN
  Administered 2014-12-29: 4 mg via ORAL
  Filled 2014-12-28: qty 1

## 2014-12-28 MED ORDER — ONDANSETRON HCL 4 MG/2ML IJ SOLN
INTRAMUSCULAR | Status: DC | PRN
  Administered 2014-12-28: 4 mg via INTRAVENOUS

## 2014-12-28 MED ORDER — DIPHENHYDRAMINE HCL 50 MG/ML IJ SOLN
12.5000 mg | Freq: Four times a day (QID) | INTRAMUSCULAR | Status: DC | PRN
  Administered 2014-12-30: 12.5 mg via INTRAVENOUS
  Filled 2014-12-28: qty 1

## 2014-12-28 MED ORDER — ALUM & MAG HYDROXIDE-SIMETH 200-200-20 MG/5ML PO SUSP: 30.0000 mL | Freq: Four times a day (QID) | ORAL | Status: DC | PRN

## 2014-12-28 MED ORDER — PROPOFOL 10 MG/ML IV BOLUS
INTRAVENOUS | Status: DC | PRN
  Administered 2014-12-28: 150 mg via INTRAVENOUS
  Administered 2014-12-28: 50 mg via INTRAVENOUS

## 2014-12-28 MED ORDER — BISMUTH SUBSALICYLATE 262 MG/15ML PO SUSP: 30.0000 mL | Freq: Three times a day (TID) | ORAL | Status: DC | PRN

## 2014-12-28 MED ORDER — SULFAMETHOXAZOLE-TRIMETHOPRIM 800-160 MG PO TABS
1.0000 | ORAL_TABLET | ORAL | Status: DC
  Administered 2014-12-28 – 2014-12-30 (×2): 1 via ORAL
  Filled 2014-12-28 (×3): qty 1

## 2014-12-28 MED ORDER — ONDANSETRON HCL 4 MG/2ML IJ SOLN: 4.0000 mg | Freq: Four times a day (QID) | INTRAMUSCULAR | Status: DC | PRN

## 2014-12-28 MED ORDER — ONDANSETRON HCL 4 MG/2ML IJ SOLN
INTRAMUSCULAR | Status: AC
  Filled 2014-12-28: qty 2

## 2014-12-28 MED ORDER — SULFAMETHOXAZOLE-TMP DS 800-160 MG PO TABS: 1.0000 | ORAL_TABLET | ORAL | Status: DC

## 2014-12-28 MED ORDER — TEMAZEPAM 15 MG PO CAPS: 15.0000 mg | ORAL_CAPSULE | Freq: Every evening | ORAL | Status: DC | PRN

## 2014-12-28 SURGICAL SUPPLY — 27 items
BLADE HEX COATED 2.75 (ELECTRODE) ×3 IMPLANT
BLADE SURG 15 STRL LF DISP TIS (BLADE) ×1 IMPLANT
BLADE SURG 15 STRL SS (BLADE) ×2
BRIEF STRETCH FOR OB PAD LRG (UNDERPADS AND DIAPERS) ×3 IMPLANT
DRAPE LAPAROTOMY T 102X78X121 (DRAPES) ×3 IMPLANT
DRSG PAD ABDOMINAL 8X10 ST (GAUZE/BANDAGES/DRESSINGS) ×3 IMPLANT
ELECT NEEDLE TIP 2.8 STRL (NEEDLE) ×3 IMPLANT
ELECT REM PT RETURN 9FT ADLT (ELECTROSURGICAL) ×3
ELECTRODE REM PT RTRN 9FT ADLT (ELECTROSURGICAL) ×1 IMPLANT
GAUZE PACKING 1 X5 YD ST (GAUZE/BANDAGES/DRESSINGS) ×3 IMPLANT
GAUZE PACKING 1/2X5YD (GAUZE/BANDAGES/DRESSINGS) ×3 IMPLANT
GAUZE SPONGE 4X4 12PLY STRL (GAUZE/BANDAGES/DRESSINGS) ×3 IMPLANT
GAUZE SPONGE 4X4 16PLY XRAY LF (GAUZE/BANDAGES/DRESSINGS) IMPLANT
GLOVE ECLIPSE 8.0 STRL XLNG CF (GLOVE) ×3 IMPLANT
GLOVE INDICATOR 8.0 STRL GRN (GLOVE) ×3 IMPLANT
GOWN STRL REUS W/TWL XL LVL3 (GOWN DISPOSABLE) ×6 IMPLANT
KIT BASIN OR (CUSTOM PROCEDURE TRAY) ×3 IMPLANT
LUBRICANT JELLY K Y 4OZ (MISCELLANEOUS) ×3 IMPLANT
NEEDLE HYPO 22GX1.5 SAFETY (NEEDLE) ×3 IMPLANT
PACK BASIC VI WITH GOWN DISP (CUSTOM PROCEDURE TRAY) ×3 IMPLANT
PENCIL BUTTON HOLSTER BLD 10FT (ELECTRODE) ×3 IMPLANT
SPONGE LAP 18X18 X RAY DECT (DISPOSABLE) ×6 IMPLANT
SYR 20CC LL (SYRINGE) ×3 IMPLANT
SYR BULB IRRIGATION 50ML (SYRINGE) ×3 IMPLANT
TOWEL OR 17X26 10 PK STRL BLUE (TOWEL DISPOSABLE) ×6 IMPLANT
TOWEL OR NON WOVEN STRL DISP B (DISPOSABLE) ×3 IMPLANT
YANKAUER SUCT BULB TIP 10FT TU (MISCELLANEOUS) ×3 IMPLANT

## 2014-12-28 NOTE — H&P (Addendum)
[facility]  [address]  [place], [place] [address] Phone: [phone] FAX: [phone]     Justin Ball  [birth year] [id number]  CARE TEAM:  PCP: No primary care provider on file.  Outpatient Care Team: Patient Care Team: Randall Hiss, MD as PCP - Infectious Diseases (Infectious Diseases) Randall Hiss, MD (Infectious Diseases)  Inpatient Treatment Team: Treatment Team: Attending Provider: Toy Baker, MD; Physician Assistant: Lottie Mussel, PA-C; Registered Nurse: Jamesetta Geralds, RN; Technician: Jerrye Beavers, NT; Consulting Physician: Bishop Limbo, MD  This patient is a 24 y.o.male who presents today for surgical evaluation at the request of Attending Provider: Toy Baker, MD; Physician Assistant: Lottie Mussel, PA-C;.   Reason for evaluation: Recurrent rectal abscess  Pleasant young male HIV positive.  Here with a male friend.  History of noncompliance of medication for financial and other reasons.  Intermittently follows up with infectious disease, particularly Dr. [name].  Had a perirectal abscess that required operative incision and drainage in 2011.  Has had intermittent rectal pain and loose bowel movements.  Somewhat related to his intermittent compliance with retroviral therapy.  Sometimes for financial reasons.  Sometimes for psychiatric / SUI / bipolar reasons.  Sometimes due to side effects.  Has not taken medications for 4 months.  He claims he is due to see his infectious disease doctor next week for a new appointment to reestablish care.  Patient's rectal pain has worsened over the past 3 weeks.  Usually managed with ibuprofen.  However became more intense.  Noticed increasing drainage.  Comes to emergency room this afternoon.  Painful perianlly on left posterior side with exquisite pain internally.  ?Rectal mass.  Concern for deeper rectal abscess.  Surgical consultation requested.  No  personal nor family history of GI/colon cancer, inflammatory bowel disease, irritable bowel syndrome, allergy such as Celiac Sprue, dietary/dairy problems, colitis, ulcers nor gastritis.  No recent sick contacts/gastroenteritis.  No travel outside the country.  No changes in diet.  No dysphagia to solids or liquids.  No significant heartburn or reflux.  No hematochezia, hematemesis, coffee ground emesis.  No evidence of prior gastric/peptic ulceration.    Past Medical History  Diagnosis Date  . HIV infection   . Syphilis   . Scabies   . Bipolar 1 disorder   . Neuromuscular disorder   . Substance abuse   . Depression   . Anxiety     Past Surgical History  Procedure Laterality Date  . Incision and drainage perirectal abscess  08/27/2010    Dr Carolynne Edouard    History   Social History  . Marital Status: Single    Spouse Name: N/A    Number of Children: N/A  . Years of Education: N/A   Occupational History  . Not on file.   Social History Main Topics  . Smoking status: Current Every Day Smoker -- 0.40 packs/day    Types: Cigarettes  . Smokeless tobacco: Never Used     Comment: 1 ciggs daily, trying to quit  . Alcohol Use: No     Comment: monthly   . Drug Use: No     Comment: past history   . Sexual Activity:    Partners: Male    Birth Control/ Protection: Condom     Comment: irregular condom use; educated   Other Topics Concern  . Not on file   Social History Narrative    Family History  Problem Relation Age of Onset  . Hypertension Mother     No current facility-administered medications for this encounter.   Current Outpatient Prescriptions  Medication Sig Dispense Refill  . citalopram (CELEXA) 20 MG tablet Take 20 mg by mouth daily.    . Darunavir Ethanolate (PREZISTA) 800 MG tablet Take 1 tablet (800 mg total) by mouth daily. 30 tablet 11  . dronabinol (MARINOL) 5 MG capsule Take 1 capsule (5 mg total) by mouth 2 (two) times daily before a meal. 60 capsule 3  .  emtricitabine-tenofovir (TRUVADA) 200-300 MG per tablet Take 1 tablet by mouth daily. 30 tablet 11  . hydrocortisone (ANUSOL-HC) 25 MG suppository Place 1 suppository (25 mg total) rectally 2 (two) times daily. 24 suppository 0  . Lurasidone HCl (LATUDA) 20 MG TABS Take 10 tablets (200 mg total) by mouth daily. 1 tablet 0  . promethazine (PHENERGAN) 50 MG tablet Take 1 tablet (50 mg total) by mouth as directed. 120 tablet 4  . ritonavir ([name]) 100 MG TABS tablet Take 1 tablet (100 mg total) by mouth daily. 30 tablet 11  . sulfamethoxazole-trimethoprim (BACTRIM DS) 800-160 MG per tablet Take 1 tablet by mouth 3 (three) times a week. 12 tablet 11  . temazepam (RESTORIL) 15 MG capsule Take 1 capsule (15 mg total) by mouth at bedtime as needed for sleep. 15 capsule 0  . traMADol (ULTRAM) 50 MG tablet Take 50 mg by mouth daily.    . hydrOXYzine (ATARAX/VISTARIL) 10 MG tablet Take 1 tablet (10 mg total) by mouth every 8 (eight) hours as needed for itching. (Patient not taking: Reported on 12/28/2014) 30 tablet 0  . triamcinolone ointment (KENALOG) 0.5 % Apply topically 2 (two) times daily. (Patient not taking: Reported on 12/28/2014) 30 g 2     No Known Allergies  ROS: Constitutional:  No fevers, chills, sweats.  Weight stable Eyes:  No vision changes, No discharge HENT:  No sore throats, nasal drainage Lymph: No neck swelling, No bruising easily Pulmonary:  No cough, productive sputum CV: No orthopnea, PND  No exertional chest/neck/shoulder/arm pain. GI: No personal nor family history of GI/colon cancer, inflammatory bowel disease, irritable bowel syndrome, allergy such as Celiac Sprue, dietary/dairy problems, colitis, ulcers nor gastritis.  No recent sick contacts/gastroenteritis.  No travel outside the country.  No changes in diet. Renal: No UTIs, No hematuria Genital:  No drainage, bleeding, masses Musculoskeletal: No severe joint pain.  Good ROM major joints Skin:  Some hyperpigmented scars on  her arms with a few open scars.  No cellulitis.   No rashes Heme/Lymph:  No easy bleeding.  No swollen lymph nodes Neuro: No focal weakness/numbness.  No seizures Psych: No suicidal ideation.  No hallucinations  BP 114/68 mmHg  Pulse 80  Temp(Src) 98.5 F (36.9 C) (Oral)  Resp 18  SpO2 99%  Physical Exam: General: Pt awake/alert/oriented x4 in no major acute distress Eyes: PERRL, normal EOM. Sclera nonicteric Neuro: CN II-XII intact w/o focal sensory/motor deficits. Lymph: No head/neck/groin lymphadenopathy Psych:  No delerium/psychosis/paranoia HENT: Normocephalic, Mucus membranes moist.  No thrush Neck: Supple, No tracheal deviation Chest: No pain.  Good respiratory excursion. CV:  Pulses intact.  Regular rhythm Abdomen: Soft, Nondistended.  Nontender.  No incarcerated hernias. GU: Normal external male genitalia.  No lesions.  Rectal.  Scar left posterior aspect with 2 cm fluctuance.  Exquisitely tender.  Sharp pain and possible mass felt on rectal exam left posterior.  Cannot tolerate digital exam for very long.  No  fissure.    Skin: No petechiae / purpurea.  No major sores Musculoskeletal: No severe joint pain.  Good ROM major joints   Results:   Labs: Results for orders placed or performed during the hospital encounter of 12/28/14 (from the past 48 hour(s))  CBC with Differential     Status: Abnormal   Collection Time: 12/28/14  3:59 PM  Result Value Ref Range   WBC 3.9 (L) 4.0 - 10.5 K/uL   RBC 5.09 4.22 - 5.81 MIL/uL   Hemoglobin 15.0 13.0 - 17.0 g/dL   HCT 16.1 09.6 - 04.5 %   MCV 93.3 78.0 - 100.0 fL   MCH 29.5 26.0 - 34.0 pg   MCHC 31.6 30.0 - 36.0 g/dL   RDW 40.9 81.1 - 91.4 %   Platelets 206 150 - 400 K/uL   Neutrophils Relative % 60 43 - 77 %   Neutro Abs 2.4 1.7 - 7.7 K/uL   Lymphocytes Relative 31 12 - 46 %   Lymphs Abs 1.2 0.7 - 4.0 K/uL   Monocytes Relative 7 3 - 12 %   Monocytes Absolute 0.3 0.1 - 1.0 K/uL   Eosinophils Relative 2 0 - 5 %    Eosinophils Absolute 0.1 0.0 - 0.7 K/uL   Basophils Relative 0 0 - 1 %   Basophils Absolute 0.0 0.0 - 0.1 K/uL  I-Stat Chem 8, ED     Status: Abnormal   Collection Time: 12/28/14  4:18 PM  Result Value Ref Range   Sodium 145 135 - 145 mmol/L   Potassium 3.4 (L) 3.5 - 5.1 mmol/L   Chloride 102 96 - 112 mEq/L   BUN 11 6 - 23 mg/dL   Creatinine, Ser 7.82 0.50 - 1.35 mg/dL   Glucose, Bld 63 (L) 70 - 99 mg/dL   Calcium, Ion 9.56 2.13 - 1.23 mmol/L   TCO2 26 0 - 100 mmol/L   Hemoglobin 17.7 (H) 13.0 - 17.0 g/dL   HCT 08.6 57.8 - 46.9 %    Imaging / Studies: No results found.  Medications / Allergies: per chart  Antibiotics: Anti-infectives    None      Assessment  Justin Ball  24 y.o. male       Problem List:  Patient Active Problem List   Diagnosis Date Noted  . H/O noncompliance with medical treatment - finincial, presenting hazards to health 12/28/2014  . Rectal abscess 12/28/2014  . Bipolar disorder 04/08/2013  . Muscle ache 01/06/2013  . Lower back pain 01/06/2013  . Insomnia 01/06/2013  . HIV (human immunodeficiency virus infection) 10/10/2012  . Rash and nonspecific skin eruption 10/10/2012  . Rectal discharge 10/10/2012  . Syphilis 10/10/2012     Sharp rectal pain with history of prior abscess suspicious for recurrent rectal abscess.  Plan:  I think this is too painful to try and manage at the bedside.  I recommended examination under anesthesia for probable drainage of rectal abscess.  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  This may require repeated operations.  Not a good idea to try and do anything too aggressive right now.  Perhaps place a seton if chronic tract noted.  Drain abscess first and regroup:  The anatomy and physiology of the region was discussed. The pathophysiology of subcutaneous abscess formation with progression to fasciitis & sepsis was discussed.  Need for incision,  drainage, debridement discussed.  I stressed good hygiene & need for  repeated wound care.  Possible redebridement / reoperation was discussed as well. Possibility of recurrence was discussed.   Risks, benefits, alternatives were discussed. I noted a good likelihood this will help address the problem.  Questions answered.  The patient agrees to proceed.   I strongly recommend he remain compliant with his HIV antiviral regimen.  That is essential to maintaining his health and immune system to avoid further problems.  We will see what SW/case management can do to help with this.  If patient staying for a prolonged period, we may try and get infectious disease involved sooner or at least plug him back in for certain.  I see no definite appointment in the system yet.  STOP SMOKING! We talked to the patient about the dangers of smoking.  We stressed that tobacco use dramatically increases the risk of peri-operative complications such as infection, tissue necrosis leaving to problems with incision/wound and organ healing, hernia, chronic pain, heart attack, stroke, DVT, pulmonary embolism, and death.  We noted there are programs in our community to help stop smoking.  Information was available.  VTE prophylaxis- SCDs, etc  mobilize as tolerated to help recovery    Ardeth Sportsman, M.D., F.A.C.S. Gastrointestinal and Minimally Invasive Surgery [facility] [address], [address] [place], [name] [address] [phone] Main / Paging   12/28/2014  Note: Portions of this report may have been transcribed using voice recognition software. Every effort was made to ensure accuracy; however, inadvertent computerized transcription errors may be present.   Any transcriptional errors that result from this process are unintentional.

## 2014-12-28 NOTE — Anesthesia Preprocedure Evaluation (Addendum)
Anesthesia Evaluation  Patient identified by MRN, date of birth, ID band Patient awake    Reviewed: Allergy & Precautions, NPO status , Patient's Chart, lab work & pertinent test results  Airway        Dental   Pulmonary Current Smoker,          Cardiovascular negative cardio ROS      Neuro/Psych negative neurological ROS  negative psych ROS   GI/Hepatic negative GI ROS, Neg liver ROS,   Endo/Other  negative endocrine ROS  Renal/GU negative Renal ROS     Musculoskeletal negative musculoskeletal ROS (+)   Abdominal   Peds  Hematology negative hematology ROS (+) HIV,   Anesthesia Other Findings   Reproductive/Obstetrics                             Anesthesia Physical Anesthesia Plan  ASA: II  Anesthesia Plan: General   Post-op Pain Management:    Induction: Intravenous  Airway Management Planned: Oral ETT  Additional Equipment: None  Intra-op Plan:   Post-operative Plan: Extubation in OR  Informed Consent: I have reviewed the patients History and Physical, chart, labs and discussed the procedure including the risks, benefits and alternatives for the proposed anesthesia with the patient or authorized representative who has indicated his/her understanding and acceptance.   Dental advisory given  Plan Discussed with: CRNA  Anesthesia Plan Comments:         Anesthesia Quick Evaluation

## 2014-12-28 NOTE — Discharge Instructions (Signed)
ANORECTAL SURGERY:  POST OPERATIVE INSTRUCTIONS  1. Take your usually prescribed home medications unless otherwise directed. 2. DIET: Follow a light bland diet the first 24 hours after arrival home, such as soup, liquids, crackers, etc.  Be sure to include lots of fluids daily.  Avoid fast food or heavy meals as your are more likely to get nauseated.  Eat a low fat the next few days after surgery.   3. PAIN CONTROL: a. Pain is best controlled by a usual combination of three different methods TOGETHER: i. Ice/Heat ii. Over the counter pain medication iii. Prescription pain medication b. Most patients will experience some swelling and discomfort in the anus/rectal area. and incisions.  Ice packs or heat (30-60 minutes up to 6 times a day) will help. Use ice for the first few days to help decrease swelling and bruising, then switch to heat such as warm towels, sitz baths, warm baths, etc to help relax tight/sore spots and speed recovery.  Some people prefer to use ice alone, heat alone, alternating between ice & heat.  Experiment to what works for you.  Swelling and bruising can take several weeks to resolve.   c. It is helpful to take an over-the-counter pain medication regularly for the first few weeks.  Choose one of the following that works best for you: i. Naproxen (Aleve, etc)  Two 220mg  tabs twice a day ii. Ibuprofen (Advil, etc) Three 200mg  tabs four times a day (every meal & bedtime) iii. Acetaminophen (Tylenol, etc) 500-650mg  four times a day (every meal & bedtime) d. A  prescription for pain medication (such as oxycodone, hydrocodone, etc) should be given to you upon discharge.  Take your pain medication as prescribed.  i. If you are having problems/concerns with the prescription medicine (does not control pain, nausea, vomiting, rash, itching, etc), please call us [phone] to see if we need to switch you to a different pain medicine that will work better for you and/or control your  side effect better. ii. If you need a refill on your pain medication, please contact your pharmacy.  They will contact our office to request authorization. Prescriptions will not be filled after 5 pm or on week-ends.  Use a Sitz Bath 4-8 times a day for relief A sitz bath is a warm water bath taken in the sitting position that covers only the hips and buttocks. It may be used for either healing or hygiene purposes. Sitz baths are also used to relieve pain, itching, or muscle spasms. The water may contain medicine. Moist heat will help you heal and relax.  HOME CARE INSTRUCTIONS  Take 3 to 4 sitz baths a day.  Fill the bathtub half full with warm water.  Sit in the water and open the drain a little.  Turn on the warm water to keep the tub half full. Keep the water running constantly.  Soak in the water for 15 to 20 minutes.  After the sitz bath, pat the affected area dry first. SEEK MEDICAL CARE IF:  You get worse instead of better. Stop the sitz baths if you get worse.   4. KEEP YOUR BOWELS REGULAR a. The goal is one bowel movement a day b. Avoid getting constipated.  Between the surgery and the pain medications, it is common to experience some constipation.  Increasing fluid intake and taking a fiber supplement (such as Metamucil, Citrucel, FiberCon, MiraLax, etc) 1-2 times a day regularly will usually help prevent this problem from occurring.  A  mild laxative (prune juice, Milk of Magnesia, MiraLax, etc) should be taken according to package directions if there are no bowel movements after 48 hours. c. Watch out for diarrhea.  If you have many loose bowel movements, simplify your diet to bland foods & liquids for a few days.  Stop any stool softeners and decrease your fiber supplement.  Switching to mild anti-diarrheal medications (Kayopectate, Pepto Bismol) can help.  If this worsens or does not improve, please call us.  5. Wound Care a. Remove your bandages the day after surgery.   Unless discharge instructions indicate otherwise, leave your bandage dry and in place overnight.  Remove the bandage during your first bowel movement.   b. Allow the wound packing to fall out over the next few days.  You can trim exposed gauze / ribbon as it falls out.  You do not need to repack the wound unless instructed otherwise.  Wear an absorbent pad or soft cotton gauze in your underwear as needed to catch any drainage and help keep the area  c. Keep the area clean and dry.  Bathe / shower every day.  Keep the area clean by showering / bathing over the incision / wound.   It is okay to soak an open wound to help wash it.  Wet wipes or showers / gentle washing after bowel movements is often less traumatic than regular toilet paper. d. Dennis Bast may have some styrofoam-like soft packing in the rectum which will come out with the first bowel movement.  e. You will often notice bleeding with bowel movements.  This should slow down by the end of the first week of surgery f. Expect some drainage.  This should slow down, too, by the end of the first week of surgery.  Wear an absorbent pad or soft cotton gauze in your underwear until the drainage stops. 6. ACTIVITIES as tolerated:   a. You may resume regular (light) daily activities beginning the next day--such as daily self-care, walking, climbing stairs--gradually increasing activities as tolerated.  If you can walk 30 minutes without difficulty, it is safe to try more intense activity such as jogging, treadmill, bicycling, low-impact aerobics, swimming, etc. b. Save the most intensive and strenuous activity for last such as sit-ups, heavy lifting, contact sports, etc  Refrain from any heavy lifting or straining until you are off narcotics for pain control.   c. DO NOT PUSH THROUGH PAIN.  Let pain be your guide: If it hurts to do something, don't do it.  Pain is your body warning you to avoid that activity for another week until the pain goes down. d. You may  drive when you are no longer taking prescription pain medication, you can comfortably sit for long periods of time, and you can safely maneuver your car and apply brakes. e. Dennis Bast may have sexual intercourse when it is comfortable.  7. FOLLOW UP in our office a. Please call [facility] at [phone] to set up an appointment to see your surgeon in the office for a follow-up appointment approximately 2 weeks after your surgery. b. Make sure that you call for this appointment the day you arrive home to insure a convenient appointment time. 10. IF YOU HAVE DISABILITY OR FAMILY LEAVE FORMS, BRING THEM TO THE OFFICE FOR PROCESSING.  DO NOT GIVE THEM TO YOUR DOCTOR.        WHEN TO CALL us [phone]: 1. Poor pain control 2. Reactions / problems with new medications (rash/itching, nausea, etc)  3. Fever over 101.5 F (38.5 C) 4. Inability to urinate 5. Nausea and/or vomiting 6. Worsening swelling or bruising 7. Continued bleeding from incision. 8. Increased pain, redness, or drainage from the incision  The clinic staff is available to answer your questions during regular business hours (8:30am-5pm).  Please dont hesitate to call and ask to speak to one of our nurses for clinical concerns.   A surgeon from [facility] is always on call at the hospitals   If you have a medical emergency, go to the nearest emergency room or call 911.    [facility] [address], [place], [name]  [address] ? MAIN: [phone] ? TOLL FREE: [phone] ? FAX [phone] [email or web address]   Peri-Rectal Abscess Your caregiver has diagnosed you as having a peri-rectal abscess. This is an infected area near the rectum that is filled with pus. If the abscess is near the surface of the skin, your caregiver may open (incise) the area and drain the pus. HOME CARE INSTRUCTIONS   If your abscess was opened up and drained. A small piece of gauze may be  placed in the opening so that it can drain. Do not remove the gauze unless directed by your caregiver.  A loose dressing may be placed over the abscess site. Change the dressing as often as necessary to keep it clean and dry.  After the drain is removed, the area may be washed with a gentle antiseptic (soap) four times per day.  A warm sitz bath, warm packs or heating pad may be used for pain relief, taking care not to burn yourself.  Return for a wound check in 1 day or as directed.  An "inflatable doughnut" may be used for sitting with added comfort. These can be purchased at a drugstore or medical supply house.  To reduce pain and straining with bowel movements, eat a high fiber diet with plenty of fruits and vegetables. Use stool softeners as recommended by your caregiver. This is especially important if narcotic type pain medications were prescribed as these may cause marked constipation.  Only take over-the-counter or prescription medicines for pain, discomfort, or fever as directed by your caregiver. SEEK IMMEDIATE MEDICAL CARE IF:   You have increasing pain that is not controlled by medication.  There is increased inflammation (redness), swelling, bleeding, or drainage from the area.  An oral temperature above 102 F (38.9 C) develops.  You develop chills or generalized malaise (feel lethargic or feel "washed out").  You develop any new symptoms (problems) you feel may be related to your present problem. Document Released: 12/08/2000 Document Revised: 03/04/2012 Document Reviewed: 12/08/2008 [facility] Patient Information 2015 [place], [place]. This information is not intended to replace advice given to you by your health care provider. Make sure you discuss any questions you have with your health care provider.  AIDS AIDS (Acquired Immune Deficiency Syndrome) is a severe viral infection caused by the Human Immunodeficiency Virus (HIV). This virus destroys a person's resistance to  disease and certain cancers. It is transmitted through blood, blood products, and body fluids. Although the fear of AIDS has grown faster than the epidemic, it is important to note that AIDS is not spread by casual contact and is easily killed by hot water, soap, bleach, and most antiseptics. HOW THE AIDS INFECTION WORKS Once HIV enters the body it affects T-helper (T-4) lymphocytes. These are white blood cells that are crucial for the immune system. Once they become  infected, they become factories for producing the AIDS Virus. Eventually these infected T-4 cells die. This leaves the victim susceptible to infection and certain cancers. While anyone can get AIDS, you are unlikely to get this disease unless you indulge in high-risk behavior. Some of these high-risk behaviors are promiscuous sex, having close relationships with HIV-positive people, and the sharing of needles. This illness was initially more common in homosexual men, but as time progresses, it will most probably affect an equal number of men and women. Babies born to women who are infected, have greater chances of getting AIDS. Infection with HIV may cause a brief, mild illness with fever several weeks after contact. More serious symptoms do not develop until months or years later, so a person can be infected with the virus without showing any symptoms at all. At this stage of the infection there is no way of knowing you are infected unless you have a blood or mouth scraping test for the AIDS virus. SYMPTOMS   Fevers, night sweats, general weakness, enlarged lymph nodes.  Chest pain, pneumonia, chronic cough, shortness of breath.  Weight loss, diarrhea, difficulty swallowing, rectal problems.  Headaches, personality changes, problems with vision and memory.  Skin tumors (patchy dark areas) and infections. There is no cure or vaccine for AIDS at the present time. Anti-viral antibiotic drugs have been shown to stop the virus from  multiplying, which helps prolong health. The best treatment against AIDS is prevention. If you have been infected with HIV, careful medical follow-up with regular blood tests is necessary.  Do not take part in risky behaviors. These include sharing needles and syringes or other sharp instruments like razors with others, having unprotected sex with high-risk people (homosexuals, bisexuals, or prostitutes), and engaging in anal sex (with or without a condom).  For further information about AIDS, please call your caregiver, the health department, or the Center for Disease Control: 800-342-AIDS. MISCONCEPTIONS ABOUT AIDS  You can not get AIDS through casual contact. This includes sitting next to an AIDS infected person, being coughed on, living with, swimming with, eating food prepared by, sitting or lying next to someone with AIDS.  It is not caught from toilet seats, from showers, bath tubs, water fountains, phones, drinking glasses, or food touched or used by people with AIDS. Casual kissing will probably not transmit the disease. "[place] kissing" (putting one's tongue in another's mouth) is probably not a good idea as the AIDS Virus is present in saliva.  You will not get AIDS by donating blood. The needles used by blood banks are sterile and disposable.  There is no evidence that AIDS is transmitted through tears.  AIDS is not caught through mosquitoes.  Children with AIDS will not pass AIDS to other children in school without exchange of blood products or engaging in sex. In school, a child with AIDS is actually at greater risk because of their weak immune status. Their susceptibility to the viruses and germs (bacteria) carried by children without AIDS is great. TESTING FOR AIDS  An ELISA (enzyme-linked immunoabsorbent assay) is a blood test available to let you know if you have contracted AIDS. If this test is positive, it is usually repeated.  If positive a second time, a second test known as  the Western blot test is performed. If the Western blot test is positive, it means you have been infected with HIV. PREVENTION  The best way to prevent AIDS is to avoid high-risk behavior.  The outlook for defeating AIDS is  good. Millions of research dollars are being spent on creating a vaccine to prevent the disease as well as providing a cure. New drugs appear to be extremely effective at controlling the disease.  Your caregiver will educate you in all the most effective and current treatments. Document Released: 12/08/2000 Document Revised: 03/04/2012 Document Reviewed: 12/04/2008 [facility] Patient Information 2015 [place], [place]. This information is not intended to replace advice given to you by your health care provider. Make sure you discuss any questions you have with your health care provider.  STOP SMOKING!  We strongly recommend that you stop smoking.  Smoking increases the risk of surgery including infection in the form of an open wound, pus formation, abscess, hernia at an incision on the abdomen, etc.  You have an increased risk of other MAJOR complications such as stroke, heart attack, forming clots in the leg and/or lungs, and death.    Smoking Cessation Quitting smoking is important to your health and has many advantages. However, it is not always easy to quit since nicotine is a very addictive drug. Often times, people try 3 times or more before being able to quit. This document explains the best ways for you to prepare to quit smoking. Quitting takes hard work and a lot of effort, but you can do it. ADVANTAGES OF QUITTING SMOKING  You will live longer, feel better, and live better.  Your body will feel the impact of quitting smoking almost immediately.  Within 20 minutes, blood pressure decreases. Your pulse returns to its normal level.  After 8 hours, carbon monoxide levels in the blood return to normal. Your oxygen level increases.  After 24 hours, the chance of having  a heart attack starts to decrease. Your breath, hair, and body stop smelling like smoke.  After 48 hours, damaged nerve endings begin to recover. Your sense of taste and smell improve.  After 72 hours, the body is virtually free of nicotine. Your bronchial tubes relax and breathing becomes easier.  After 2 to 12 weeks, lungs can hold more air. Exercise becomes easier and circulation improves.  The risk of having a heart attack, stroke, cancer, or lung disease is greatly reduced.  After 1 year, the risk of coronary heart disease is cut in half.  After 5 years, the risk of stroke falls to the same as a nonsmoker.  After 10 years, the risk of lung cancer is cut in half and the risk of other cancers decreases significantly.  After 15 years, the risk of coronary heart disease drops, usually to the level of a nonsmoker.  If you are pregnant, quitting smoking will improve your chances of having a healthy baby.  The people you live with, especially any children, will be healthier.  You will have extra money to spend on things other than cigarettes. QUESTIONS TO THINK ABOUT BEFORE ATTEMPTING TO QUIT You may want to talk about your answers with your caregiver.  Why do you want to quit?  If you tried to quit in the past, what helped and what did not?  What will be the most difficult situations for you after you quit? How will you plan to handle them?  Who can help you through the tough times? Your family? Friends? A caregiver?  What pleasures do you get from smoking? What ways can you still get pleasure if you quit? Here are some questions to ask your caregiver:  How can you help me to be successful at quitting?  What medicine do you  think would be best for me and how should I take it?  What should I do if I need more help?  What is smoking withdrawal like? How can I get information on withdrawal? GET READY  Set a quit date.  Change your environment by getting rid of all  cigarettes, ashtrays, matches, and lighters in your home, car, or work. Do not let people smoke in your home.  Review your past attempts to quit. Think about what worked and what did not. GET SUPPORT AND ENCOURAGEMENT You have a better chance of being successful if you have help. You can get support in many ways.  Tell your family, friends, and co-workers that you are going to quit and need their support. Ask them not to smoke around you.  Get individual, group, or telephone counseling and support. Programs are available at [employer] and health centers. Call your local health department for information about programs in your area.  Spiritual beliefs and practices may help some smokers quit.  Download a "quit meter" on your computer to keep track of quit statistics, such as how long you have gone without smoking, cigarettes not smoked, and money saved.  Get a self-help book about quitting smoking and staying off of tobacco. LEARN NEW SKILLS AND BEHAVIORS  Distract yourself from urges to smoke. Talk to someone, go for a walk, or occupy your time with a task.  Change your normal routine. Take a different route to work. Drink tea instead of coffee. Eat breakfast in a different place.  Reduce your stress. Take a hot bath, exercise, or read a book.  Plan something enjoyable to do every day. Reward yourself for not smoking.  Explore interactive web-based programs that specialize in helping you quit. GET MEDICINE AND USE IT CORRECTLY Medicines can help you stop smoking and decrease the urge to smoke. Combining medicine with the above behavioral methods and support can greatly increase your chances of successfully quitting smoking.  Nicotine replacement therapy helps deliver nicotine to your body without the negative effects and risks of smoking. Nicotine replacement therapy includes nicotine gum, lozenges, inhalers, nasal sprays, and skin patches. Some may be available over-the-counter and  others require a prescription.  Antidepressant medicine helps people abstain from smoking, but how this works is unknown. This medicine is available by prescription.  Nicotinic receptor partial agonist medicine simulates the effect of nicotine in your brain. This medicine is available by prescription. Ask your caregiver for advice about which medicines to use and how to use them based on your health history. Your caregiver will tell you what side effects to look out for if you choose to be on a medicine or therapy. Carefully read the information on the package. Do not use any other product containing nicotine while using a nicotine replacement product.  RELAPSE OR DIFFICULT SITUATIONS Most relapses occur within the first 3 months after quitting. Do not be discouraged if you start smoking again. Remember, most people try several times before finally quitting. You may have symptoms of withdrawal because your body is used to nicotine. You may crave cigarettes, be irritable, feel very hungry, cough often, get headaches, or have difficulty concentrating. The withdrawal symptoms are only temporary. They are strongest when you first quit, but they will go away within 10 14 days. To reduce the chances of relapse, try to:  Avoid drinking alcohol. Drinking lowers your chances of successfully quitting.  Reduce the amount of caffeine you consume. Once you quit smoking, the amount of  caffeine in your body increases and can give you symptoms, such as a rapid heartbeat, sweating, and anxiety.  Avoid smokers because they can make you want to smoke.  Do not let weight gain distract you. Many smokers will gain weight when they quit, usually less than 10 pounds. Eat a healthy diet and stay active. You can always lose the weight gained after you quit.  Find ways to improve your mood other than smoking. FOR MORE INFORMATION  www.smokefree.gov    While it can be one of the most difficult things to do, the [place]  community has programs to help you stop.  Consider talking with your primary care physician about options.  Also, Smoking Cessation classes are available through the [facility]:  The smoking cessation program is a proven-effective program from the American Lung Association. The program is available for anyone 10 and older who currently smokes. The program lasts for 7 weeks and is 8 sessions. Each class will be approximately 1 1/2 hours. The program is every Tuesday.  All classes are 12-1:30pm and same location.  Event Location Information:  Location: [facility] 2nd Floor Conference Room 2-037; located next to [facility] cross streets: [facility] Entrance into the [facility] is adjacent to the [facility] main entrance. The conference room is located on the 2nd floor.  Parking Instructions: Visitor parking is adjacent to [employer] main entrance and the Cancer Center    A smoking cessation program is also offered through the [facility]. Register online at [email or web address] or call [phone] for more information.   Tobacco cessation counseling is available at [facility]. Call [phone] for a free appointment.   Tobacco cessation classes also are available through the [facility] in [place]. For information, call [phone].   The Patient Education Network features videos on tobacco cessation. Please consult your listings in the center of this book to find instructions on how to access this resource.   If you want more information, ask your nurse.

## 2014-12-28 NOTE — ED Provider Notes (Signed)
CSN: [id number]     Arrival date & time 12/28/14  1343 History   First MD Initiated Contact with Patient 12/28/14 1537     Chief Complaint  Patient presents with  . buttock abscess      (Consider location/radiation/quality/duration/timing/severity/associated sxs/prior Treatment) HPI KENAN MOODIE is a 24 y.o. male with history of HIV/AIDS infection, syphilis, depression, polysubstance abuse, presents to ED with complaint of rectal pain. Pt states pain started about 2 wks ago. Worsening. Hx of rectal abscess with surgical drainage. States feels the same. States his grandma looked at it and told him he had a "large bump over the rectum." admits to subjective fever and chills. Did not take temperature. No other complaints.    Past Medical History  Diagnosis Date  . HIV infection   . Syphilis   . Scabies   . Bipolar 1 disorder   . Neuromuscular disorder   . Substance abuse   . Depression   . Anxiety    Past Surgical History  Procedure Laterality Date  . Rectal surgery      drainage of abscess   Family History  Problem Relation Age of Onset  . Hypertension Mother    History  Substance Use Topics  . Smoking status: Current Every Day Smoker -- 0.40 packs/day    Types: Cigarettes  . Smokeless tobacco: Never Used     Comment: 1 ciggs daily, trying to quit  . Alcohol Use: No     Comment: monthly     Review of Systems  Constitutional: Positive for fever and chills.  Respiratory: Negative for cough, chest tightness and shortness of breath.   Cardiovascular: Negative for chest pain, palpitations and leg swelling.  Gastrointestinal: Positive for rectal pain. Negative for nausea, vomiting, abdominal pain, diarrhea, blood in stool, abdominal distention and anal bleeding.  Genitourinary: Negative for dysuria, urgency, frequency and hematuria.  Musculoskeletal: Negative for myalgias and arthralgias.  Skin: Negative for rash.  Allergic/Immunologic: Positive for immunocompromised  state.  Neurological: Negative for headaches.  All other systems reviewed and are negative.     Allergies  Review of patient's allergies indicates no known allergies.  Home Medications   Prior to Admission medications   Medication Sig Start Date End Date Taking? Authorizing Provider  citalopram (CELEXA) 20 MG tablet Take 20 mg by mouth daily.    Historical Provider, MD  Darunavir Ethanolate (PREZISTA) 800 MG tablet Take 1 tablet (800 mg total) by mouth daily. 01/30/14   Randall Hiss, MD  dronabinol (MARINOL) 5 MG capsule Take 1 capsule (5 mg total) by mouth 2 (two) times daily before a meal. 10/06/13   Randall Hiss, MD  emtricitabine-tenofovir (TRUVADA) 200-300 MG per tablet Take 1 tablet by mouth daily. 01/30/14   Randall Hiss, MD  hydrocortisone (ANUSOL-HC) 25 MG suppository Place 1 suppository (25 mg total) rectally 2 (two) times daily. 05/30/13   Hayden Rasmussen, NP  hydrOXYzine (ATARAX/VISTARIL) 10 MG tablet Take 1 tablet (10 mg total) by mouth every 8 (eight) hours as needed for itching. 07/22/13   Gardiner Barefoot, MD  Lurasidone HCl (LATUDA) 20 MG TABS Take 10 tablets (200 mg total) by mouth daily. 10/06/13   Randall Hiss, MD  promethazine (PHENERGAN) 50 MG tablet Take 1 tablet (50 mg total) by mouth as directed. 10/06/13   Randall Hiss, MD  ritonavir ([name]) 100 MG TABS tablet Take 1 tablet (100 mg total) by mouth daily. 01/30/14   Remi Haggard  Erskine Squibb, MD  sulfamethoxazole-trimethoprim (BACTRIM DS) 800-160 MG per tablet Take 1 tablet by mouth 3 (three) times a week. 01/30/14   Randall Hiss, MD  temazepam (RESTORIL) 15 MG capsule Take 1 capsule (15 mg total) by mouth at bedtime as needed for sleep. 10/06/13   Randall Hiss, MD  triamcinolone ointment (KENALOG) 0.5 % Apply topically 2 (two) times daily. 01/06/13   Randall Hiss, MD   BP 116/68 mmHg  Pulse 75  Temp(Src) 98.2 F (36.8 C) (Oral)  Resp 20  SpO2 98% Physical Exam   Constitutional: He is oriented to person, place, and time. He appears well-developed and well-nourished. No distress.  HENT:  Head: Normocephalic and atraumatic.  Eyes: Conjunctivae are normal.  Neck: Neck supple.  Cardiovascular: Normal rate, regular rhythm and normal heart sounds.   Pulmonary/Chest: Effort normal. No respiratory distress. He has no wheezes. He has no rales.  Abdominal: Soft. Bowel sounds are normal. He exhibits no distension. There is no tenderness. There is no rebound.  Genitourinary:  No hemorrhoids. Tenderness over right perirectal area with some induration. Tenderness extends intrarectally, able to express purulent drainage with rectal exam  Musculoskeletal: He exhibits no edema.  Neurological: He is alert and oriented to person, place, and time.  Skin: Skin is warm and dry.  Nursing note and vitals reviewed.   ED Course  Procedures (including critical care time) Labs Review Labs Reviewed  CBC WITH DIFFERENTIAL - Abnormal; Notable for the following:    WBC 3.9 (*)    All other components within normal limits  I-STAT CHEM 8, ED - Abnormal; Notable for the following:    Potassium 3.4 (*)    Glucose, Bld 63 (*)    Hemoglobin 17.7 (*)    All other components within normal limits    Imaging Review No results found.   EKG Interpretation None      MDM   Final diagnoses:  Perirectal abscess    Last CD4 90 1 year ago. Has been off of his meds for a month now. VS normal here. He does not appear septic. Will get labs.   Pt with perirectal/rectal abscess. Spoke with Dr. Michaell Cowing, will come by see pt.   Filed Vitals:   12/28/14 2104 12/28/14 2204 12/28/14 2303 12/29/14 0004  BP: 113/69 116/76 109/83 125/86  Pulse: 64 71 74 86  Temp: 98.2 F (36.8 C) 97.8 F (36.6 C) 98.1 F (36.7 C) 98.4 F (36.9 C)  TempSrc: Oral Oral Oral Oral  Resp: Height:  (1.854 m)     Weight: 178 lb 3.3 oz (80.833 kg)     SpO2: 100% 99% 100% 100%      Lottie Mussel, PA-C 12/29/14 0107  Toy Baker, MD 12/31/14 1327

## 2014-12-28 NOTE — ED Notes (Signed)
Per pt, states he has a lump on his rexctum-has been treated for abscess in past

## 2014-12-28 NOTE — Transfer of Care (Signed)
Immediate Anesthesia Transfer of Care Note  Patient: Justin Ball  Procedure(s) Performed: Procedure(s) (LRB): IRRIGATION AND DEBRIDEMENT PERIRECTAL ABSCESS (N/A) EXAM UNDER ANESTHESIA (N/A)  Patient Location: PACU  Anesthesia Type: General  Level of Consciousness: sedated, patient cooperative and responds to stimulation  Airway & Oxygen Therapy: Patient Spontanous Breathing and Patient connected to face mask oxgen  Post-op Assessment: Report given to PACU RN and Post -op Vital signs reviewed and stable  Post vital signs: Reviewed and stable  Complications: No apparent anesthesia complications

## 2014-12-28 NOTE — Anesthesia Postprocedure Evaluation (Signed)
Anesthesia Post Note  Patient: Justin Ball  Procedure(s) Performed: Procedure(s) (LRB): IRRIGATION AND DEBRIDEMENT PERIRECTAL ABSCESS (N/A) EXAM UNDER ANESTHESIA (N/A)  Anesthesia type: General  Patient location: PACU  Post pain: Pain level controlled  Post assessment: Post-op Vital signs reviewed  Last Vitals: BP 113/69 mmHg  Pulse 64  Temp(Src) 36.8 C (Oral)  Resp 16  SpO2 100%  Post vital signs: Reviewed  Level of consciousness: sedated  Complications: No apparent anesthesia complications

## 2014-12-28 NOTE — Op Note (Signed)
12/28/2014  7:51 PM  PATIENT:  Justin Ball  24 y.o. 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  PERIRECTAL FISTULOUS TRACT.  2. PERIANAL SKIN MASS (probable condyloma)  DISPOSITION OF SPECIMEN:  PATHOLOGY  COUNTS:  YES  PLAN OF CARE: Admit for overnight observation  PATIENT DISPOSITION:  PACU - hemodynamically stable.  INDICATION: Young HIV-positive male off his medications with history of prior rectal abscess.  Worsening rectal pain and drainage.  Exam concerning for abscess.  I recommended examination under anesthesia with drainage of abscess and possible fistulotomy versus seton  The anatomy and physiology of abscesses was discussed. Pathophysiology of abscess, possible progression to fasciitis & sepsis, etc discussed . I stressed good hygiene & wound care. Possible redebridement was discussed as well.   Possibility of recurrence was discussed. Risks, benefits, alternatives were discussed. I noted a good likelihood this will help address the problem. Risks of anesthesia and other risks discussed. Questions answered. The patient is does wish to proceed.   OR FINDINGS:   5cm deep abscess primarily posterior midline but slightly to the left between internal and external sphincters and the  intersphincteric component.  2 external skin openings perianal and in anal canal c/w fistula component.  No more proximal rectal opening detected. Wound measures 5cm by 3 cm.   DESCRIPTION:   Informed consent was confirmed. The patient received IV antibiotics. The patient underwent general anesthesia without any difficulty. The patient was positioned prone. SCDs were active during the entire case. .  Range was prepped and draped in a sterile fashion.  Surgical timeout confirmed our plan.  Gamma the external and digital examination.  Saw numerous small tiny skin tags suspicious for possible condyloma versus benign lesions.  I excised the largest one was sent for pathology to confirm or dissuade diagnosis of condyloma.  The chronic scar in the left posterior aspect was probed and there was an opening 2 cm more proximally in the distal anal canal in the left posterior aspect.  It was superficial to the sphincters.  I performed a superficial fistulotomy and excised the redundant skin to leave an open wound.  Upon doing this I noted an obvious chronic abscess cavity between the internal and external sphincters into the intersphincteric space.  Some pus was evacuated.  Patient had chronic granulation tissue. The chronic abscess was tracking up into the posterior rectum.  Careful digital, visual, and probe examination revealed no no more proximal opening.  Could not place a seton as I did not have an internal component.    I did needle tip point cautery to ablate around the perianal condyloma and skin.  They are about 40 small lesions and carefully ablated.  Hemostasis was good.  I packed the intersphincteric abscess cavity with half-inch ribbon Nu Gauze to keep it from closing down.  Sterile dressings applied.  Patient is being extubated go to recovery room.   We plan to continue IV antibiotics and begin wound care training tomorrow.   I am about to discuss OR findings with patient's family per the  patient's request  Ardeth Sportsman, M.D., F.A.C.S. Gastrointestinal and Minimally Invasive Surgery [facility] [address] [place], [name] [address] [phone] Main / Paging

## 2014-12-28 NOTE — Consult Note (Deleted)
[facility]  [address] [place]  [place], [place] [address] Phone: [phone] FAX: [phone]     KRISTOPHER ATTWOOD  1991-07-29 [id number]  CARE TEAM:  PCP: No primary care provider on file.  Outpatient Care Team: Patient Care Team: Randall Hiss, MD as PCP - Infectious Diseases (Infectious Diseases) Randall Hiss, MD (Infectious Diseases)  Inpatient Treatment Team: Treatment Team: Attending Provider: Toy Baker, MD; Physician Assistant: Lottie Mussel, PA-C; Registered Nurse: Jamesetta Geralds, RN; Technician: Jerrye Beavers, NT; Consulting Physician: Bishop Limbo, MD  This patient is a 24 y.o.male who presents today for surgical evaluation at the request of Attending Provider: Toy Baker, MD; Physician Assistant: Lottie Mussel, PA-C;.   Reason for evaluation: Recurrent rectal abscess  Pleasant young male HIV positive.  Here with a male friend.  History of noncompliance of medication for financial and other reasons.  Intermittently follows up with infectious disease, particularly Dr. [name].  Had a perirectal abscess that required operative incision and drainage in 2011.  Has had intermittent rectal pain and loose bowel movements.  Somewhat related to his intermittent compliance with retroviral therapy.  Sometimes for financial reasons.  Sometimes for psychiatric / SUI / bipolar reasons.  Sometimes due to side effects.  Has not taken medications for 4 months.  He claims he is due to see his infectious disease doctor next week for a new appointment to reestablish care.  Patient's rectal pain has worsened over the past 3 weeks.  Usually managed with ibuprofen.  However became more intense.  Noticed increasing drainage.  Comes to emergency room this afternoon.  Painful perianlly on left posterior side with exquisite pain internally.  ?Rectal mass.  Concern for deeper rectal abscess.  Surgical consultation  requested.  No personal nor family history of GI/colon cancer, inflammatory bowel disease, irritable bowel syndrome, allergy such as Celiac Sprue, dietary/dairy problems, colitis, ulcers nor gastritis.  No recent sick contacts/gastroenteritis.  No travel outside the country.  No changes in diet.  No dysphagia to solids or liquids.  No significant heartburn or reflux.  No hematochezia, hematemesis, coffee ground emesis.  No evidence of prior gastric/peptic ulceration.    Past Medical History  Diagnosis Date  . HIV infection   . Syphilis   . Scabies   . Bipolar 1 disorder   . Neuromuscular disorder   . Substance abuse   . Depression   . Anxiety     Past Surgical History  Procedure Laterality Date  . Incision and drainage perirectal abscess  08/27/2010    Dr Carolynne Edouard    History   Social History  . Marital Status: Single    Spouse Name: N/A    Number of Children: N/A  . Years of Education: N/A   Occupational History  . Not on file.   Social History Main Topics  . Smoking status: Current Every Day Smoker -- 0.40 packs/day    Types: Cigarettes  . Smokeless tobacco: Never Used     Comment: 1 ciggs daily, trying to quit  . Alcohol Use: No     Comment: monthly   . Drug Use: No     Comment: past history   . Sexual Activity:    Partners: Male    Birth Control/ Protection: Condom     Comment: irregular condom use; educated   Other Topics Concern  . Not on file   Social History Narrative    Family  History  Problem Relation Age of Onset  . Hypertension Mother     No current facility-administered medications for this encounter.   Current Outpatient Prescriptions  Medication Sig Dispense Refill  . citalopram (CELEXA) 20 MG tablet Take 20 mg by mouth daily.    . Darunavir Ethanolate (PREZISTA) 800 MG tablet Take 1 tablet (800 mg total) by mouth daily. 30 tablet 11  . dronabinol (MARINOL) 5 MG capsule Take 1 capsule (5 mg total) by mouth 2 (two) times daily before a meal. 60  capsule 3  . emtricitabine-tenofovir (TRUVADA) 200-300 MG per tablet Take 1 tablet by mouth daily. 30 tablet 11  . hydrocortisone (ANUSOL-HC) 25 MG suppository Place 1 suppository (25 mg total) rectally 2 (two) times daily. 24 suppository 0  . Lurasidone HCl (LATUDA) 20 MG TABS Take 10 tablets (200 mg total) by mouth daily. 1 tablet 0  . promethazine (PHENERGAN) 50 MG tablet Take 1 tablet (50 mg total) by mouth as directed. 120 tablet 4  . ritonavir ([name]) 100 MG TABS tablet Take 1 tablet (100 mg total) by mouth daily. 30 tablet 11  . sulfamethoxazole-trimethoprim (BACTRIM DS) 800-160 MG per tablet Take 1 tablet by mouth 3 (three) times a week. 12 tablet 11  . temazepam (RESTORIL) 15 MG capsule Take 1 capsule (15 mg total) by mouth at bedtime as needed for sleep. 15 capsule 0  . traMADol (ULTRAM) 50 MG tablet Take 50 mg by mouth daily.    . hydrOXYzine (ATARAX/VISTARIL) 10 MG tablet Take 1 tablet (10 mg total) by mouth every 8 (eight) hours as needed for itching. (Patient not taking: Reported on 12/28/2014) 30 tablet 0  . triamcinolone ointment (KENALOG) 0.5 % Apply topically 2 (two) times daily. (Patient not taking: Reported on 12/28/2014) 30 g 2     No Known Allergies  ROS: Constitutional:  No fevers, chills, sweats.  Weight stable Eyes:  No vision changes, No discharge HENT:  No sore throats, nasal drainage Lymph: No neck swelling, No bruising easily Pulmonary:  No cough, productive sputum CV: No orthopnea, PND  No exertional chest/neck/shoulder/arm pain. GI: No personal nor family history of GI/colon cancer, inflammatory bowel disease, irritable bowel syndrome, allergy such as Celiac Sprue, dietary/dairy problems, colitis, ulcers nor gastritis.  No recent sick contacts/gastroenteritis.  No travel outside the country.  No changes in diet. Renal: No UTIs, No hematuria Genital:  No drainage, bleeding, masses Musculoskeletal: No severe joint pain.  Good ROM major joints Skin:  No sores or  lesions.  No rashes Heme/Lymph:  No easy bleeding.  No swollen lymph nodes Neuro: No focal weakness/numbness.  No seizures Psych: No suicidal ideation.  No hallucinations  BP 114/68 mmHg  Pulse 80  Temp(Src) 98.5 F (36.9 C) (Oral)  Resp 18  SpO2 99%  Physical Exam: General: Pt awake/alert/oriented x4 in no major acute distress Eyes: PERRL, normal EOM. Sclera nonicteric Neuro: CN II-XII intact w/o focal sensory/motor deficits. Lymph: No head/neck/groin lymphadenopathy Psych:  No delerium/psychosis/paranoia HENT: Normocephalic, Mucus membranes moist.  No thrush Neck: Supple, No tracheal deviation Chest: No pain.  Good respiratory excursion. CV:  Pulses intact.  Regular rhythm Abdomen: Soft, Nondistended.  Nontender.  No incarcerated hernias. GU: Normal external male genitalia.  No lesions.  Rectal.  Scar left posterior aspect with 2 cm fluctuance.  Exquisitely tender.  Sharp pain and possible mass felt on rectal exam left posterior.  Cannot tolerate digital exam for very long.  No fissure.  No definite condylomata.  Ext:  SCDs  BLE.  No significant edema.  No cyanosis Skin: No petechiae / purpurea.  No major sores Musculoskeletal: No severe joint pain.  Good ROM major joints   Results:   Labs: Results for orders placed or performed during the hospital encounter of 12/28/14 (from the past 48 hour(s))  CBC with Differential     Status: Abnormal   Collection Time: 12/28/14  3:59 PM  Result Value Ref Range   WBC 3.9 (L) 4.0 - 10.5 K/uL   RBC 5.09 4.22 - 5.81 MIL/uL   Hemoglobin 15.0 13.0 - 17.0 g/dL   HCT 16.1 09.6 - 04.5 %   MCV 93.3 78.0 - 100.0 fL   MCH 29.5 26.0 - 34.0 pg   MCHC 31.6 30.0 - 36.0 g/dL   RDW 40.9 81.1 - 91.4 %   Platelets 206 150 - 400 K/uL   Neutrophils Relative % 60 43 - 77 %   Neutro Abs 2.4 1.7 - 7.7 K/uL   Lymphocytes Relative 31 12 - 46 %   Lymphs Abs 1.2 0.7 - 4.0 K/uL   Monocytes Relative 7 3 - 12 %   Monocytes Absolute 0.3 0.1 - 1.0 K/uL    Eosinophils Relative 2 0 - 5 %   Eosinophils Absolute 0.1 0.0 - 0.7 K/uL   Basophils Relative 0 0 - 1 %   Basophils Absolute 0.0 0.0 - 0.1 K/uL  I-Stat Chem 8, ED     Status: Abnormal   Collection Time: 12/28/14  4:18 PM  Result Value Ref Range   Sodium 145 135 - 145 mmol/L   Potassium 3.4 (L) 3.5 - 5.1 mmol/L   Chloride 102 96 - 112 mEq/L   BUN 11 6 - 23 mg/dL   Creatinine, Ser 7.82 0.50 - 1.35 mg/dL   Glucose, Bld 63 (L) 70 - 99 mg/dL   Calcium, Ion 9.56 2.13 - 1.23 mmol/L   TCO2 26 0 - 100 mmol/L   Hemoglobin 17.7 (H) 13.0 - 17.0 g/dL   HCT 08.6 57.8 - 46.9 %    Imaging / Studies: No results found.  Medications / Allergies: per chart  Antibiotics: Anti-infectives    None      Assessment  Justin Ball  24 y.o. male       Problem List:  Active Problems:   * No active hospital problems. Lambert Mody rectal pain with history of prior abscess suspicious for recurrent rectal abscess.  Plan:  I think this is to painful to try and manage at the bedside.  I recommended examination under anesthesia for probable drainage of rectal abscess.  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  This may require repeated operations.  Not a good idea to try and do anything too aggressive right now.  Perhaps place a seton if chronic tract noted.  Drain abscess first and regroup:  The anatomy and physiology of the region was discussed. The pathophysiology of subcutaneous abscess formation with progression to fasciitis & sepsis was discussed.  Need for incision, drainage, debridement discussed.  I stressed good hygiene & need for repeated wound care.  Possible redebridement / reoperation was discussed as well. Possibility of recurrence was discussed.   Risks, benefits, alternatives were discussed. I noted a good likelihood this will help address the problem.  Questions answered.  The patient agrees to proceed.   I strongly recommend  he remain compliant with his HIV antiviral regimen.  That is essential  to maintaining his health and immune system to avoid further problems.  We will see what SW/case management can do to help with this.  If patient staying prolonged period tied may try and get infectious disease involved sooner or at least plug him back in for certain.  I see no definite appointment in the system yet.  STOP SMOKING! We talked to the patient about the dangers of smoking.  We stressed that tobacco use dramatically increases the risk of peri-operative complications such as infection, tissue necrosis leaving to problems with incision/wound and organ healing, hernia, chronic pain, heart attack, stroke, DVT, pulmonary embolism, and death.  We noted there are programs in our community to help stop smoking.  Information was available.  VTE prophylaxis- SCDs, etc  mobilize as tolerated to help recovery    Ardeth Sportsman, M.D., F.A.C.S. Gastrointestinal and Minimally Invasive Surgery [facility] [address] [place], [name] [address] [phone] Main / Paging   12/28/2014  Note: Portions of this report may have been transcribed using voice recognition software. Every effort was made to ensure accuracy; however, inadvertent computerized transcription errors may be present.   Any transcriptional errors that result from this process are unintentional.

## 2014-12-29 ENCOUNTER — Encounter (HOSPITAL_COMMUNITY): Payer: Self-pay | Admitting: Surgery

## 2014-12-29 MED ORDER — HYDROMORPHONE HCL 1 MG/ML IJ SOLN
0.5000 mg | INTRAMUSCULAR | Status: DC | PRN
  Administered 2014-12-29: 1 mg via INTRAVENOUS
  Filled 2014-12-29: qty 1

## 2014-12-29 MED ORDER — NICOTINE 14 MG/24HR TD PT24
14.0000 mg | MEDICATED_PATCH | Freq: Every day | TRANSDERMAL | Status: DC
  Administered 2014-12-29 – 2014-12-30 (×2): 14 mg via TRANSDERMAL
  Filled 2014-12-29 (×2): qty 1

## 2014-12-29 MED ORDER — OXYCODONE HCL 5 MG PO TABS
5.0000 mg | ORAL_TABLET | ORAL | Status: DC | PRN
  Administered 2014-12-29: 10 mg via ORAL
  Administered 2014-12-29 (×2): 5 mg via ORAL
  Administered 2014-12-30: 10 mg via ORAL
  Filled 2014-12-29: qty 2
  Filled 2014-12-29: qty 1
  Filled 2014-12-29: qty 2
  Filled 2014-12-29: qty 1

## 2014-12-29 NOTE — Care Management Note (Signed)
    Page 1 of 1   12/29/2014     2:53:13 PM CARE MANAGEMENT NOTE 12/29/2014  Patient:  Justin SpurrCOLE,Justin R   Account Number:  [id number]  Date Initiated:  12/29/2014  Documentation initiated by:  Lorenda IshiharaPEELE,Adi Seales  Subjective/Objective Assessment:   24 yo male admitted with rectal pain, abscess s/p I&D in OR. PTA lived at home with grandmother.     Action/Plan:   Home when stable   Anticipated DC Date:  01/01/2015   Anticipated DC Plan:  HOME/SELF CARE      DC Planning Services  CM consult  Medication Assistance      Choice offered to / List presented to:             Status of service:  Completed, signed off Medicare Important Message given?   (If response is "NO", the following Medicare IM given date fields will be blank) Date Medicare IM given:   Medicare IM given by:   Date Additional Medicare IM given:   Additional Medicare IM given by:    Discharge Disposition:  HOME/SELF CARE  Per UR Regulation:  Reviewed for med. necessity/level of care/duration of stay  If discussed at Long Length of Stay Meetings, dates discussed:    Comments:  12-29-14 Lorenda IshiharaSuzanne Desarea Ohagan RN CM [phone] Spoke with patient at bedside regarding issues with medications. He states he has been unable to get to his doctor appts due to his job. Discussed with him the importance of maintaining his health and medication regimen. Contacted ID clinic and appt scheduled for 01-19-15 with Dr. Daiva EvesVan Dam for f/u and assistance with mediations. Patient was appreciative and states he will keep appt.

## 2014-12-29 NOTE — Progress Notes (Signed)
1 Day Post-Op  Subjective: Crying and complaining of pain Taking dilaudid for pain  Objective: Vital signs in last 24 hours: Temp:  [97.6 F (36.4 C)-98.6 F (37 C)] 98.4 F (36.9 C) (01/05 0617) Pulse Rate:  [61-86] 62 (01/05 0617) Resp:  [16-20] 16 (01/05 0617) BP: (103-125)/(68-86) 125/79 mmHg (01/05 0617) SpO2:  [98 %-100 %] 98 % (01/05 0617) Weight:  [80.833 kg (178 lb 3.3 oz)] 80.833 kg (178 lb 3.3 oz) (01/04 2104) Last BM Date: 12/26/14 600 PO last PM Afebrile, VSS PM labs below Intake/Output from previous day: 01/04 0701 - 01/05 0700 In: 1650 [P.O.:600; I.V.:1050] Out: 400 [Urine:400] Intake/Output this shift: Total I/O In: 600 [I.V.:300; IV Piggyback:300] Out: 0   General appearance: alert, cooperative and mild distress Packing removed from rectum.  site looks OK, no BM so far.  Lab Results:   Recent Labs  12/28/14 1559 12/28/14 1618  WBC 3.9*  --   HGB 15.0 17.7*  HCT 47.5 52.0  PLT 206  --     BMET  Recent Labs  12/28/14 1618  NA 145  K 3.4*  CL 102  GLUCOSE 63*  BUN 11  CREATININE 0.80   PT/INR No results for input(s): LABPROT, INR in the last 72 hours.  No results for input(s): AST, ALT, ALKPHOS, BILITOT, PROT, ALBUMIN in the last 168 hours.   Lipase     Component Value Date/Time   LIPASE 18 09/09/2014 1104     Studies/Results: No results found.  Medications: . cefTRIAXone (ROCEPHIN)  IV  2 g Intravenous Q24H  . heparin  5,000 Units Subcutaneous 3 times per day  . lip balm  1 application Topical BID  . metronidazole  500 mg Intravenous Q6H  . nicotine  14 mg Transdermal Daily  . psyllium  1 packet Oral BID  . saccharomyces boulardii  250 mg Oral BID  . sodium chloride  3 mL Intravenous Q12H  . sulfamethoxazole-trimethoprim  1 tablet Oral Once per day on Mon Wed Fri   . sodium chloride 75 mL/hr at 12/29/14 0200  . lactated ringers     Prior to Admission medications   Medication Sig Start Date End Date Taking? Authorizing  Provider  citalopram (CELEXA) 20 MG tablet Take 20 mg by mouth daily.   Yes Historical Provider, MD  Darunavir Ethanolate (PREZISTA) 800 MG tablet Take 1 tablet (800 mg total) by mouth daily. 01/30/14  Yes Randall Hissornelius N Van Dam, MD  dronabinol (MARINOL) 5 MG capsule Take 1 capsule (5 mg total) by mouth 2 (two) times daily before a meal. 10/06/13  Yes Randall Hissornelius N Van Dam, MD  emtricitabine-tenofovir (TRUVADA) 200-300 MG per tablet Take 1 tablet by mouth daily. 01/30/14  Yes Randall Hissornelius N Van Dam, MD  hydrocortisone (ANUSOL-HC) 25 MG suppository Place 1 suppository (25 mg total) rectally 2 (two) times daily. 05/30/13  Yes Hayden Rasmussenavid Mabe, NP  Lurasidone HCl (LATUDA) 20 MG TABS Take 10 tablets (200 mg total) by mouth daily. 10/06/13  Yes Randall Hissornelius N Van Dam, MD  promethazine (PHENERGAN) 50 MG tablet Take 1 tablet (50 mg total) by mouth as directed. 10/06/13  Yes Randall Hissornelius N Van Dam, MD  ritonavir ([name]) 100 MG TABS tablet Take 1 tablet (100 mg total) by mouth daily. 01/30/14  Yes Randall Hissornelius N Van Dam, MD  sulfamethoxazole-trimethoprim (BACTRIM DS) 800-160 MG per tablet Take 1 tablet by mouth 3 (three) times a week. 01/30/14  Yes Randall Hissornelius N Van Dam, MD  temazepam (RESTORIL) 15 MG capsule Take 1  capsule (15 mg total) by mouth at bedtime as needed for sleep. 10/06/13  Yes Randall Hiss, MD  hydrOXYzine (ATARAX/VISTARIL) 10 MG tablet Take 1 tablet (10 mg total) by mouth every 8 (eight) hours as needed for itching. Patient not taking: Reported on 12/28/2014 07/22/13   Gardiner Barefoot, MD  traMADol (ULTRAM) 50 MG tablet Take 1-2 tablets (50-100 mg total) by mouth daily. 12/28/14   Karie Soda, MD  triamcinolone ointment (KENALOG) 0.5 % Apply topically 2 (two) times daily. Patient not taking: Reported on 12/28/2014 01/06/13   Randall Hiss, MD     Assessment/Plan Chronic intersphincteric rectal abscess/fistula, Perianal condyloma accuminatum S/p INCISION AND DRAINAGE OF INTERSPHINCTERIC RECTAL ABSCESS FISTULOTOMY,  ABLATION OF PERIANALCONDYLOMA, EXAM UNDER ANESTHESIA, Karie Soda, MD, 12/28/2014 . HIV Hx of syphilis/scabies Bipolar 1 disorder Hx of substance abuse Hx of anxiety/depression   Plan:  Get case Manager to see about HIV medicines, get him off dilaudid.  He wants to stay another night.  Shower and sitz baths.  We will see what arrangements we can make for him.  Work on mobilizing, but i don't expect much today.    LOS: 1 day    Justin Ball 12/29/2014

## 2014-12-29 NOTE — Progress Notes (Signed)
Verbal order from MD Daphine DeutscherMartin for patient to receive nicotine patch 14 mg, order entered Stanford BreedBracey, Albaro Deviney N RN 12-29-2014 10:22am

## 2014-12-30 MED ORDER — PSYLLIUM 95 % PO PACK: PACK | ORAL | Status: DC

## 2014-12-30 MED ORDER — SACCHAROMYCES BOULARDII 250 MG PO CAPS
ORAL_CAPSULE | ORAL | Status: DC
Start: 2014-12-30 — End: 2015-10-17

## 2014-12-30 MED ORDER — IBUPROFEN 200 MG PO TABS: ORAL_TABLET | ORAL | Status: DC

## 2014-12-30 MED ORDER — IBUPROFEN 600 MG PO TABS
600.0000 mg | ORAL_TABLET | Freq: Four times a day (QID) | ORAL | Status: DC
  Administered 2014-12-30: 600 mg via ORAL
  Filled 2014-12-30 (×4): qty 1

## 2014-12-30 MED ORDER — OXYCODONE-ACETAMINOPHEN 5-325 MG PO TABS: 1.0000 | ORAL_TABLET | ORAL | Status: DC | PRN

## 2014-12-30 NOTE — Progress Notes (Signed)
2 Days Post-Op  Subjective: Lying on couch with friend, tolerating diet and BM. Room smells like cigarette tobacco. Objective: Vital signs in last 24 hours: Temp:  [97.9 F (36.6 C)-98.7 F (37.1 C)] 97.9 F (36.6 C) (01/06 0635) Pulse Rate:  [65-86] 65 (01/06 0635) Resp:  [16] 16 (01/06 0635) BP: (105-122)/(63-79) 105/63 mmHg (01/06 0635) SpO2:  [99 %-100 %] 99 % (01/06 0635) Last BM Date: 12/26/14 Taking po's well and 2 BM Afebrile, VSS No labs Intake/Output from previous day: 01/05 0701 - 01/06 0700 In: 3300 [P.O.:1200; I.V.:1800; IV Piggyback:300] Out: 8 [Urine:6; Stool:2] Intake/Output this shift:    General appearance: alert, cooperative and no distress  Lab Results:   Recent Labs  12/28/14 1559 12/28/14 1618  WBC 3.9*  --   HGB 15.0 17.7*  HCT 47.5 52.0  PLT 206  --     BMET  Recent Labs  12/28/14 1618  NA 145  K 3.4*  CL 102  GLUCOSE 63*  BUN 11  CREATININE 0.80   PT/INR No results for input(s): LABPROT, INR in the last 72 hours.  No results for input(s): AST, ALT, ALKPHOS, BILITOT, PROT, ALBUMIN in the last 168 hours.   Lipase     Component Value Date/Time   LIPASE 18 09/09/2014 1104     Studies/Results: No results found.  Medications: . cefTRIAXone (ROCEPHIN)  IV  2 g Intravenous Q24H  . heparin  5,000 Units Subcutaneous 3 times per day  . ibuprofen  600 mg Oral QID  . lip balm  1 application Topical BID  . metronidazole  500 mg Intravenous Q6H  . nicotine  14 mg Transdermal Daily  . psyllium  1 packet Oral BID  . saccharomyces boulardii  250 mg Oral BID  . sodium chloride  3 mL Intravenous Q12H  . sulfamethoxazole-trimethoprim  1 tablet Oral Once per day on Mon Wed Fri    Assessment/Plan Chronic intersphincteric rectal abscess/fistula, Perianal condyloma accuminatum S/p INCISION AND DRAINAGE OF INTERSPHINCTERIC RECTAL ABSCESS FISTULOTOMY, ABLATION OF PERIANALCONDYLOMA, EXAM UNDER ANESTHESIA, Justin SodaSteven Gross, MD, 12/28/2014  . HIV Hx of syphilis/scabies Bipolar 1 disorder Hx of substance abuse Hx of anxiety/depression   Plan:  Home today   Follow up and meds arranged if he follows up.     LOS: 2 days    Justin Ball 12/30/2014

## 2015-01-01 NOTE — Discharge Summary (Signed)
Physician Discharge Summary  Patient ID: Justin Ball MRN: [id number] DOB/AGE: 1991-09-06 23 y.o.  Admit date: 12/28/2014 Discharge date: 12/30/2014  Admission Diagnoses:   Sharp rectal pain with history of prior abscess suspicious for recurrent rectal abscess. HIV Hx of syphilis/scabies Bipolar 1 disorder Hx of substance abuse Hx of anxiety/depression Hx of Medical non compliance   Discharge Diagnoses:  Chronic intersphincteric rectal abscess/fistula, Perianal condyloma acuminatum HIV Hx of syphilis/scabies Bipolar 1 disorder Hx of substance abuse Hx of anxiety/depression Hx of Medical non compliance    Principal Problem:   Rectal abscess Active Problems:   HIV (human immunodeficiency virus infection)   Bipolar disorder   H/O noncompliance with medical treatment - finincial, presenting hazards to health   Intersphincteric abscess   PROCEDURES: INCISION AND DRAINAGE OF INTERSPHINCTERIC RECTAL ABSCESS FISTULOTOMY, ABLATION OF PERIANALCONDYLOMA, EXAM UNDER ANESTHESIA, Karie Soda, MD, 12/28/2014 .  Hospital Course:  Pleasant young male HIV positive. Here with a male friend. History of noncompliance of medication for financial and other reasons. Intermittently follows up with infectious disease, particularly Dr. [name]. Had a perirectal abscess that required operative incision and drainage in 2011. Has had intermittent rectal pain and loose bowel movements. Somewhat related to his intermittent compliance with retroviral therapy. Sometimes for financial reasons. Sometimes for psychiatric / SUI / bipolar reasons. Sometimes due to side effects. Has not taken medications for 4 months. He claims he is due to see his infectious disease doctor next week for a new appointment to reestablish care. Patient's rectal pain has worsened over the past 3 weeks. Usually managed with ibuprofen. However became more intense. Noticed increasing drainage. Comes to emergency room this  afternoon. Painful perianlly on left posterior side with exquisite pain internally. ?Rectal mass. Concern for deeper rectal abscess. Surgical consultation requested. No personal nor family history of GI/colon cancer, inflammatory bowel disease, irritable bowel syndrome, allergy such as Celiac Sprue, dietary/dairy problems, colitis, ulcers nor gastritis. No recent sick contacts/gastroenteritis. No travel outside the country. No changes in diet. No dysphagia to solids or liquids. No significant heartburn or reflux. No hematochezia, hematemesis, coffee ground emesis. No evidence of prior gastric/peptic ulceration. Pt was admitted and taken to the OR in the evening of 12/28/14.  With the procedure as noted above.  He did well.  We removed the packing the following AM and started showers, Sitz baths, and mobilization.  He was ready for dc the following 2nd post op day.  We had Care management set him up with follow up appointments in the ID clinic for his HIV and he has arrangements to get his HIV medications.  Condition on dc:  Improving   Disposition: 01-Home or Self Care  Discharge Instructions    Call MD for:  extreme fatigue    Complete by:  As directed      Call MD for:  hives    Complete by:  As directed      Call MD for:  persistant nausea and vomiting    Complete by:  As directed      Call MD for:  redness, tenderness, or signs of infection (pain, swelling, redness, odor or green/yellow discharge around incision site)    Complete by:  As directed      Call MD for:  severe uncontrolled pain    Complete by:  As directed      Call MD for:    Complete by:  As directed   Temperature > 101.67F     Diet - low  sodium heart healthy    Complete by:  As directed      Discharge instructions    Complete by:  As directed   Please see discharge instruction sheets.  Also refer to handout given an office.  Please call our office if you have any questions or concerns [phone]      Discharge wound care:    Complete by:  As directed   If you have closed incisions, shower and bathe over these incisions with soap and water every day.  Remove all surgical dressings on postoperative day #3.  You do not need to replace dressings over the closed incisions unless you feel more comfortable with a Band-Aid covering it.   If you have an open wound that requires packing, please see wound care instructions.  In general, remove all dressings, wash wound with soap and water and then replace with saline moistened gauze.  Do the dressing change at least every day.  Please call our office [phone] if you have further questions.     Driving Restrictions    Complete by:  As directed   No driving until off narcotics and can safely swerve away without pain during an emergency     Increase activity slowly    Complete by:  As directed   Walk an hour a day.  Use 20-30 minute walks.  When you can walk 30 minutes without difficulty, increase to low impact/moderate activities such as biking, jogging, swimming, sexual activity..  Eventually can increase to unrestricted activity when not feeling pain.  If you feel pain: STOP!Marland Kitchen   Let pain protect you from overdoing it.  Use ice/heat/over-the-counter pain medications to help minimize his soreness.  Use pain prescriptions as needed to remain active.  It is better to take extra pain medications and be more active than to stay bedridden to avoid all pain medications.     Lifting restrictions    Complete by:  As directed   Avoid heavy lifting initially.  Do not push through pain.  You have no specific weight limit.  Coughing and sneezing or four more stressful to your incision than any lifting you will do. Pain will protect you from injury.  Therefore, avoid intense activity until off all narcotic pain medications.  Coughing and sneezing or four more stressful to your incision than any lifting he will do.     May shower / Bathe    Complete by:  As directed       May walk up steps    Complete by:  As directed      Sexual Activity Restrictions    Complete by:  As directed   Sexual activity as tolerated.  Do not push through pain.  Pain will protect you from injury.     Walk with assistance    Complete by:  As directed   Walk over an hour a day.  May use a walker/cane/companion to help with balance and stamina.            Medication List    TAKE these medications        citalopram 20 MG tablet  Commonly known as:  CELEXA  Take 20 mg by mouth daily.     Darunavir Ethanolate 800 MG tablet  Commonly known as:  PREZISTA  Take 1 tablet (800 mg total) by mouth daily.     dronabinol 5 MG capsule  Commonly known as:  MARINOL  Take 1 capsule (5 mg total) by mouth  2 (two) times daily before a meal.     emtricitabine-tenofovir 200-300 MG per tablet  Commonly known as:  TRUVADA  Take 1 tablet by mouth daily.     hydrocortisone 25 MG suppository  Commonly known as:  ANUSOL-HC  Place 1 suppository (25 mg total) rectally 2 (two) times daily.     hydrOXYzine 10 MG tablet  Commonly known as:  ATARAX/VISTARIL  Take 1 tablet (10 mg total) by mouth every 8 (eight) hours as needed for itching.     ibuprofen 200 MG tablet  Commonly known as:  ADVIL,MOTRIN  You can take 2-3 tablets every 6 hours as needed for pain.     Lurasidone HCl 20 MG Tabs  Commonly known as:  LATUDA  Take 10 tablets (200 mg total) by mouth daily.     oxyCODONE-acetaminophen 5-325 MG per tablet  Commonly known as:  PERCOCET/ROXICET  Take 1-2 tablets by mouth every 4 (four) hours as needed for moderate pain.     promethazine 50 MG tablet  Commonly known as:  PHENERGAN  Take 1 tablet (50 mg total) by mouth as directed.     psyllium 95 % Pack  Commonly known as:  HYDROCIL/METAMUCIL  Use this to keep your stools soft and decrease stress on your rectum.     ritonavir 100 MG Tabs tablet  Commonly known as:  [name]  Take 1 tablet (100 mg total) by mouth daily.      saccharomyces boulardii 250 MG capsule  Commonly known as:  FLORASTOR  You can buy this over the counter and take as directed on package.     sulfamethoxazole-trimethoprim 800-160 MG per tablet  Commonly known as:  BACTRIM DS  Take 1 tablet by mouth 3 (three) times a week.     temazepam 15 MG capsule  Commonly known as:  RESTORIL  Take 1 capsule (15 mg total) by mouth at bedtime as needed for sleep.     traMADol 50 MG tablet  Commonly known as:  ULTRAM  Take 1-2 tablets (50-100 mg total) by mouth daily.     triamcinolone ointment 0.5 %  Commonly known as:  KENALOG  Apply topically 2 (two) times daily.           Follow-up Information    Follow up with Acey Lavornelius Van Dam, MD. Schedule an appointment as soon as possible for a visit in 1 week.   Specialty:  Infectious Diseases   Why:  To follow up after your hospital stay   Contact information:   [address] [place] [address] [phone]       Follow up with GROSS,STEVEN C., MD. Schedule an appointment as soon as possible for a visit in 2 weeks.   Specialty:  General Surgery   Contact information:   [address] [place] [address] [phone]       Follow up with Acey Lavornelius Van Dam, MD On 01/19/2015.   Specialty:  Infectious Diseases   Why:  Arrive by 2pm to speak with Finacial Counselor prior to appt at 2:30 pm with Dr. Daiva EvesVan Dam   Contact information:   [address] [place] [address] [phone]       Signed: Sherrie GeorgeJENNINGS,Krupa Stege 01/01/2015, 1:34 PM

## 2015-01-07 ENCOUNTER — Encounter (HOSPITAL_COMMUNITY): Payer: Self-pay | Admitting: Emergency Medicine

## 2015-01-07 ENCOUNTER — Emergency Department (HOSPITAL_COMMUNITY)
Admission: EM | Admit: 2015-01-07 | Discharge: 2015-01-07 | Disposition: A | Discharge status: Home / Self Care | Payer: MEDICAID | Attending: Emergency Medicine | Admitting: Emergency Medicine

## 2015-01-07 DIAGNOSIS — Z8669 Personal history of other diseases of the nervous system and sense organs: Secondary | ICD-10-CM | POA: Insufficient documentation

## 2015-01-07 DIAGNOSIS — Z4801 Encounter for change or removal of surgical wound dressing: Secondary | ICD-10-CM | POA: Insufficient documentation

## 2015-01-07 DIAGNOSIS — Z8619 Personal history of other infectious and parasitic diseases: Secondary | ICD-10-CM | POA: Insufficient documentation

## 2015-01-07 DIAGNOSIS — Z21 Asymptomatic human immunodeficiency virus [HIV] infection status: Secondary | ICD-10-CM | POA: Insufficient documentation

## 2015-01-07 DIAGNOSIS — F319 Bipolar disorder, unspecified: Secondary | ICD-10-CM | POA: Insufficient documentation

## 2015-01-07 DIAGNOSIS — Z79899 Other long term (current) drug therapy: Secondary | ICD-10-CM | POA: Insufficient documentation

## 2015-01-07 DIAGNOSIS — Z720 Tobacco use: Secondary | ICD-10-CM | POA: Insufficient documentation

## 2015-01-07 DIAGNOSIS — Z5189 Encounter for other specified aftercare: Secondary | ICD-10-CM

## 2015-01-07 DIAGNOSIS — F419 Anxiety disorder, unspecified: Secondary | ICD-10-CM | POA: Insufficient documentation

## 2015-01-07 MED ORDER — HYDROXYZINE HCL 25 MG PO TABS: 25.0000 mg | ORAL_TABLET | Freq: Four times a day (QID) | ORAL | Status: DC | PRN

## 2015-01-07 NOTE — Discharge Instructions (Signed)
Continue your wound care as per your surgeon's instructions.  Continue warm sitz bath and gentle cleaning  twice a day.

## 2015-01-07 NOTE — ED Notes (Signed)
Pt had surgery last Friday for abscess, states that surgical sight is pink, edematous, draining serosanguinous fluid. Pt states he was not prescribed any Abx and so has not taken any. On assessment, open wound around anus, in gluteal fold, appears moist, pink, no obvious signs of infection noted by this RN.

## 2015-01-07 NOTE — ED Provider Notes (Signed)
CSN: [id number]     Arrival date & time 01/07/15  1113 History   First MD Initiated Contact with Patient 01/07/15 1146     Chief Complaint  Patient presents with  . Wound Check     HPI  Patient presents for evaluation with concern about an infection. History is recent incision and drainage of perirectal abscess 01/01/2014.  States that his incisions are showing some itching. He feels like they might be draining. He states are "swollen and pink. Has not seen a surgeon for follow-up appointment regarding wound care.  Past Medical History  Diagnosis Date  . HIV infection   . Syphilis   . Scabies   . Bipolar 1 disorder   . Neuromuscular disorder   . Substance abuse   . Depression   . Anxiety    Past Surgical History  Procedure Laterality Date  . Incision and drainage perirectal abscess  08/27/2010    Dr Carolynne Edouard  . Incision and drainage perirectal abscess N/A 12/28/2014    Procedure: IRRIGATION AND DEBRIDEMENT PERIRECTAL ABSCESS;  Surgeon: Karie Soda, MD;  Location: [facility];  Service: General;  Laterality: N/A;  Fistula repair and ablation  . Examination under anesthesia N/A 12/28/2014    Procedure: EXAM UNDER ANESTHESIA;  Surgeon: Karie Soda, MD;  Location: [facility];  Service: General;  Laterality: N/A;   Family History  Problem Relation Age of Onset  . Hypertension Mother    History  Substance Use Topics  . Smoking status: Current Every Day Smoker -- 0.25 packs/day    Types: Cigarettes  . Smokeless tobacco: Never Used  . Alcohol Use: No    Review of Systems  Constitutional: Negative for fever, chills, diaphoresis, appetite change and fatigue.  HENT: Negative for mouth sores, sore throat and trouble swallowing.   Eyes: Negative for visual disturbance.  Respiratory: Negative for cough, chest tightness, shortness of breath and wheezing.   Cardiovascular: Negative for chest pain.  Gastrointestinal: Negative for nausea, vomiting, abdominal pain, diarrhea and abdominal distention.    Endocrine: Negative for polydipsia, polyphagia and polyuria.  Genitourinary: Negative for dysuria, frequency and hematuria.       Concerned about wounds in his perianal area from recent surgery  Musculoskeletal: Negative for gait problem.  Skin: Positive for wound. Negative for color change, pallor and rash.  Neurological: Negative for dizziness, syncope, light-headedness and headaches.  Hematological: Does not bruise/bleed easily.  Psychiatric/Behavioral: Negative for behavioral problems and confusion.      Allergies  Review of patient's allergies indicates no known allergies.  Home Medications   Prior to Admission medications   Medication Sig Start Date End Date Taking? Authorizing Provider  citalopram (CELEXA) 20 MG tablet Take 20 mg by mouth daily.   Yes Historical Provider, MD  Darunavir Ethanolate (PREZISTA) 800 MG tablet Take 1 tablet (800 mg total) by mouth daily. 01/30/14  Yes Randall Hiss, MD  dronabinol (MARINOL) 5 MG capsule Take 1 capsule (5 mg total) by mouth 2 (two) times daily before a meal. 10/06/13  Yes Randall Hiss, MD  emtricitabine-tenofovir (TRUVADA) 200-300 MG per tablet Take 1 tablet by mouth daily. 01/30/14  Yes Randall Hiss, MD  ibuprofen (ADVIL,MOTRIN) 200 MG tablet You can take 2-3 tablets every 6 hours as needed for pain. Patient taking differently: Take 1,000 mg by mouth every 6 (six) hours as needed for moderate pain. You can take 2-3 tablets every 6 hours as needed for pain. 12/30/14  Yes Sherrie George, PA-C  Lurasidone HCl (LATUDA) 20 MG TABS Take 10 tablets (200 mg total) by mouth daily. 10/06/13  Yes Randall Hiss, MD  oxyCODONE-acetaminophen (PERCOCET/ROXICET) 5-325 MG per tablet Take 1-2 tablets by mouth every 4 (four) hours as needed for moderate pain. 12/30/14  Yes Sherrie George, PA-C  promethazine (PHENERGAN) 50 MG tablet Take 1 tablet (50 mg total) by mouth as directed. Patient taking differently: Take 50 mg by mouth  daily.  10/06/13  Yes Randall Hiss, MD  ritonavir ([name]) 100 MG TABS tablet Take 1 tablet (100 mg total) by mouth daily. 01/30/14  Yes Randall Hiss, MD  saccharomyces boulardii (FLORASTOR) 250 MG capsule You can buy this over the counter and take as directed on package. Patient taking differently: Take 250 mg by mouth daily as needed (constipation/ digestive health). You can buy this over the counter and take as directed on package. 12/30/14  Yes Sherrie George, PA-C  sulfamethoxazole-trimethoprim (BACTRIM DS) 800-160 MG per tablet Take 1 tablet by mouth 3 (three) times a week. Patient taking differently: Take 1 tablet by mouth 2 (two) times a week. Monday and thursday 01/30/14  Yes Randall Hiss, MD  temazepam (RESTORIL) 15 MG capsule Take 1 capsule (15 mg total) by mouth at bedtime as needed for sleep. 10/06/13  Yes Randall Hiss, MD  traMADol (ULTRAM) 50 MG tablet Take 1-2 tablets (50-100 mg total) by mouth daily. 12/28/14  Yes Karie Soda, MD  hydrocortisone (ANUSOL-HC) 25 MG suppository Place 1 suppository (25 mg total) rectally 2 (two) times daily. Patient not taking: Reported on 01/07/2015 05/30/13   Hayden Rasmussen, NP  hydrOXYzine (ATARAX/VISTARIL) 25 MG tablet Take 1 tablet (25 mg total) by mouth every 6 (six) hours as needed for itching. 01/07/15   Rolland Porter, MD  psyllium (HYDROCIL/METAMUCIL) 95 % PACK Use this to keep your stools soft and decrease stress on your rectum. Patient not taking: Reported on 01/07/2015 12/30/14   Sherrie George, PA-C  triamcinolone ointment (KENALOG) 0.5 % Apply topically 2 (two) times daily. Patient not taking: Reported on 12/28/2014 01/06/13   Randall Hiss, MD   BP 117/78 mmHg  Pulse 75  Temp(Src) 98 F (36.7 C) (Oral)  Resp 16  SpO2 100% Physical Exam  Constitutional: He is oriented to person, place, and time. He appears well-developed and well-nourished. No distress.  HENT:  Head: Normocephalic.  Eyes: Conjunctivae are normal.  Pupils are equal, round, and reactive to light. No scleral icterus.  Neck: Normal range of motion. Neck supple. No thyromegaly present.  Cardiovascular: Normal rate and regular rhythm.  Exam reveals no gallop and no friction rub.   No murmur heard. Pulmonary/Chest: Effort normal and breath sounds normal. No respiratory distress. He has no wheezes. He has no rales.  Abdominal: Soft. Bowel sounds are normal. He exhibits no distension. There is no tenderness. There is no rebound.  Genitourinary:     Musculoskeletal: Normal range of motion.  Neurological: He is alert and oriented to person, place, and time.  Skin: Skin is warm and dry. No rash noted.  Psychiatric: He has a normal mood and affect. His behavior is normal.    ED Course  Procedures (including critical care time) Labs Review Labs Reviewed - No data to display  Imaging Review No results found.   EKG Interpretation None      MDM   Final diagnoses:  Visit for wound check    Wounds appear to be healing well and  no signs of infection. He was concerned because it "dark pink" wound show excellent granulation. Soft edges without induration or fluctuance and no sign of cellulitis or secondary infection. I instructed him to continue cleaning and basic wound care as per his surgeon's instructions. I encouraged him to follow-up with his surgeon. He was given the information for his surgeon's office again.    Rolland PorterMark Larrie Fraizer, MD 01/07/15 1308

## 2015-01-19 ENCOUNTER — Encounter: Payer: Self-pay | Admitting: Infectious Disease

## 2015-01-19 ENCOUNTER — Ambulatory Visit: Payer: Self-pay

## 2015-01-19 ENCOUNTER — Ambulatory Visit (INDEPENDENT_AMBULATORY_CARE_PROVIDER_SITE_OTHER): Payer: Self-pay | Admitting: Infectious Disease

## 2015-01-19 VITALS — BP 119/82 | HR 72 | Temp 98.1°F | Ht 73.0 in | Wt 159.0 lb

## 2015-01-19 DIAGNOSIS — F3160 Bipolar disorder, current episode mixed, unspecified: Secondary | ICD-10-CM

## 2015-01-19 DIAGNOSIS — B20 Human immunodeficiency virus [HIV] disease: Secondary | ICD-10-CM

## 2015-01-19 DIAGNOSIS — Z91199 Patient's noncompliance with other medical treatment and regimen due to unspecified reason: Secondary | ICD-10-CM

## 2015-01-19 DIAGNOSIS — D013 Carcinoma in situ of anus and anal canal: Secondary | ICD-10-CM

## 2015-01-19 DIAGNOSIS — K614 Intrasphincteric abscess: Secondary | ICD-10-CM

## 2015-01-19 DIAGNOSIS — K611 Rectal abscess: Secondary | ICD-10-CM

## 2015-01-19 DIAGNOSIS — Z9119 Patient's noncompliance with other medical treatment and regimen: Secondary | ICD-10-CM

## 2015-01-19 DIAGNOSIS — A539 Syphilis, unspecified: Secondary | ICD-10-CM

## 2015-01-19 LAB — CBC WITH DIFFERENTIAL/PLATELET
BASOS ABS: 0 10*3/uL (ref 0.0–0.1)
Basophils Relative: 1 % (ref 0–1)
Eosinophils Absolute: 0.3 10*3/uL (ref 0.0–0.7)
Eosinophils Relative: 11 % — ABNORMAL HIGH (ref 0–5)
HEMATOCRIT: 42.3 % (ref 39.0–52.0)
Hemoglobin: 14.6 g/dL (ref 13.0–17.0)
Lymphocytes Relative: 29 % (ref 12–46)
Lymphs Abs: 0.9 10*3/uL (ref 0.7–4.0)
MCH: 31.3 pg (ref 26.0–34.0)
MCHC: 34.5 g/dL (ref 30.0–36.0)
MCV: 90.8 fL (ref 78.0–100.0)
MPV: 9.9 fL (ref 8.6–12.4)
Monocytes Absolute: 0.2 10*3/uL (ref 0.1–1.0)
Monocytes Relative: 6 % (ref 3–12)
NEUTROS ABS: 1.6 10*3/uL — AB (ref 1.7–7.7)
Neutrophils Relative %: 53 % (ref 43–77)
Platelets: 320 10*3/uL (ref 150–400)
RBC: 4.66 MIL/uL (ref 4.22–5.81)
RDW: 13.7 % (ref 11.5–15.5)
WBC: 3.1 10*3/uL — ABNORMAL LOW (ref 4.0–10.5)

## 2015-01-19 LAB — COMPLETE METABOLIC PANEL WITH GFR
ALBUMIN: 4.1 g/dL (ref 3.5–5.2)
ALT: 36 U/L (ref 0–53)
AST: 35 U/L (ref 0–37)
Alkaline Phosphatase: 92 U/L (ref 39–117)
BUN: 8 mg/dL (ref 6–23)
CHLORIDE: 104 meq/L (ref 96–112)
CO2: 29 meq/L (ref 19–32)
Calcium: 9.7 mg/dL (ref 8.4–10.5)
Creat: 0.88 mg/dL (ref 0.50–1.35)
GFR, Est African American: >89 mL/min
GLUCOSE: 69 mg/dL — AB (ref 70–99)
POTASSIUM: 4.2 meq/L (ref 3.5–5.3)
Sodium: 139 mEq/L (ref 135–145)
Total Bilirubin: 0.7 mg/dL (ref 0.2–1.2)
Total Protein: 8.2 g/dL (ref 6.0–8.3)

## 2015-01-19 LAB — RPR

## 2015-01-19 MED ORDER — EMTRICITABINE-TENOFOVIR DF 200-300 MG PO TABS: 1.0000 | ORAL_TABLET | Freq: Every day | ORAL | Status: DC

## 2015-01-19 MED ORDER — DOLUTEGRAVIR SODIUM 50 MG PO TABS: 50.0000 mg | ORAL_TABLET | Freq: Every day | ORAL | Status: DC

## 2015-01-19 MED ORDER — SULFAMETHOXAZOLE-TRIMETHOPRIM 800-160 MG PO TABS: 1.0000 | ORAL_TABLET | Freq: Every day | ORAL | Status: DC

## 2015-01-19 MED ORDER — AZITHROMYCIN 600 MG PO TABS: 1200.0000 mg | ORAL_TABLET | ORAL | Status: DC

## 2015-01-19 NOTE — Progress Notes (Signed)
Subjective:    Patient ID: Justin Ball, male    DOB: 11/16/1991, 24 y.o.   MRN: [id number]  HPI   24 year old with  HIV/AIDS whom we started on tivicay and truvada in early November  2013 (when he started taking the meds). He claimmed THEN  that when starting his antiretrovirals he developed lower back pain as well as pain in his left shoulder and paresthesias in his left hand.   We had found nice drop Viral load and doubling of his CD4 count from 50 to 150 by January 2014. We then changed him over to Prezista [name] and Truvada. He has been apparently VERy poorly compliant. We at one point went to [place], [name] and Truvada and in November of 2014 he dropped VL to 600 range.  He has known 184V mutation.  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 Karie SodaSteven Gross on  December 28, 2014.  Dr. [name] brought home importance of patient TAKING HIS ARVS and we were able via bridge counselor, Mitch to get him back to clinic but he has not had repeat labs to allow us to renew his ADAP  He did take his new partners Tivicay and Truvada for the past 2 days without any side effects.  He has  Job for first time in his life  And this is grounding him.        Review of Systems  Constitutional: Negative for chills, diaphoresis, activity change, appetite change, fatigue and unexpected weight change.  HENT: Negative for congestion, rhinorrhea, sinus pressure, sneezing, sore throat and trouble swallowing.   Eyes: Negative for photophobia and visual disturbance.  Respiratory: Negative for chest tightness, shortness of breath, wheezing and stridor.   Cardiovascular: Negative for palpitations and leg swelling.  Gastrointestinal: Negative for abdominal distention and anal bleeding.  Genitourinary: Negative for flank pain and  difficulty urinating.  Musculoskeletal: Negative for back pain, joint swelling and gait problem.  Skin: Negative for color change, pallor and wound.  Neurological: Negative for dizziness, tremors, weakness and light-headedness.  Hematological: Negative for adenopathy. Does not bruise/bleed easily.  Psychiatric/Behavioral: Negative for behavioral problems, confusion, self-injury and agitation. The patient is not hyperactive.        Objective:   Physical Exam  Constitutional: He is oriented to person, place, and time. He appears well-developed and well-nourished. No distress.  HENT:  Head: Normocephalic and atraumatic.  Mouth/Throat: Oropharynx is clear and moist. No oropharyngeal exudate.  Eyes: Conjunctivae and EOM are normal. Pupils are equal, round, and reactive to light. No scleral icterus.  Neck: Normal range of motion. Neck supple. No JVD present.  Cardiovascular: Normal rate, regular rhythm and normal heart sounds.   Pulmonary/Chest: Effort normal. No respiratory distress. He has no wheezes.  Abdominal: Soft. He exhibits no distension and no mass. There is tenderness. There is no rebound.  Musculoskeletal: He exhibits no edema or tenderness.  Lymphadenopathy:    He has no cervical adenopathy.  Neurological: He is alert and oriented to person, place, and time. He has normal reflexes. He exhibits normal muscle tone. Coordination normal.  Skin: Skin is warm and dry. He is not diaphoretic. No erythema. No pallor.  Psychiatric: Judgment normal. His mood appears anxious. He is not agitated. He exhibits a depressed mood. He expresses no homicidal and no suicidal ideation.  He expresses no suicidal plans and no homicidal plans.          Assessment & Plan:   AIDS, HIV disease: I have put in scripts for Tivicay and Truvada along with Bactrim and Azithromycin for PCP and Mavium prophylaxis. He needs to bring pay stubs to enroll into ADAP and I can call to expedite his application. Can  also consider [employer].  He is NOT to take his partners meds in interim He should meet with [name] after he gets his meds and should come back one month after starting to have labs rechecked.  I spent greater than 40 minutes with the patient including greater than 50% of time in face to face counsel of the patient and in coordination of their care.    I spent greater than 45 minutes with the patient including greater than 50% of time in face to face counsel of the patient and in coordination of their care.   Bipolar disorder mixed and active : needs to go see  [facility]   Noncompliance: Mitch from [facility] engaged.   AIN, rectal abscess and fistula: NEEDS TO TAKE HIS ARVS to reconstitute his immune system and minimize more chances of complications related to his HPV infection and consequences

## 2015-01-19 NOTE — Patient Instructions (Signed)
We will restart following regimen   TIVICAY 50mg  tablet daily (one yellow tablet daily)   Truvada tablet daily (one blue tablet daily)  You also need to continue your Bactrim DS daily to prevent PCP pneumonia  You also need to start Azithromycin 1200mg  weekly  We will recheck blood work one month after starting meds and you should see Dr. Daiva EvesVan Dam in 5 weeks  I would also like you to meet with Ulyses SouthwardMinh Pham to go over your meds once you have received them

## 2015-01-20 LAB — HIV-1 RNA ULTRAQUANT REFLEX TO GENTYP+
HIV 1 RNA Quant: 1029 copies/mL — ABNORMAL HIGH (ref <20)
HIV-1 RNA QUANT, LOG: 3.01 {Log} — AB (ref <1.30)

## 2015-01-21 LAB — T-HELPER CELL (CD4) - (RCID CLINIC ONLY)
CD4 T CELL ABS: 40 /uL — AB (ref 400–2700)
CD4 T CELL HELPER: 4 % — AB (ref 33–55)

## 2015-01-21 LAB — URINE CYTOLOGY ANCILLARY ONLY
Chlamydia: NEGATIVE
Neisseria Gonorrhea: NEGATIVE

## 2015-01-26 ENCOUNTER — Other Ambulatory Visit: Payer: Self-pay | Admitting: *Deleted

## 2015-01-26 DIAGNOSIS — B20 Human immunodeficiency virus [HIV] disease: Secondary | ICD-10-CM

## 2015-01-26 MED ORDER — EMTRICITABINE-TENOFOVIR DF 200-300 MG PO TABS: 1.0000 | ORAL_TABLET | Freq: Every day | ORAL | Status: DC

## 2015-01-26 MED ORDER — DOLUTEGRAVIR SODIUM 50 MG PO TABS: 50.0000 mg | ORAL_TABLET | Freq: Every day | ORAL | Status: DC

## 2015-01-26 NOTE — Telephone Encounter (Signed)
ADAP Application 

## 2015-01-27 LAB — HIV-1 INTEGRASE GENOTYPE

## 2015-01-27 LAB — HIV-1 GENOTYPR PLUS

## 2015-01-30 ENCOUNTER — Emergency Department (HOSPITAL_COMMUNITY)
Admission: EM | Admit: 2015-01-30 | Discharge: 2015-01-30 | Disposition: A | Discharge status: Home / Self Care | Payer: Self-pay | Attending: Emergency Medicine | Admitting: Emergency Medicine

## 2015-01-30 ENCOUNTER — Encounter (HOSPITAL_COMMUNITY): Payer: Self-pay | Admitting: Physical Medicine and Rehabilitation

## 2015-01-30 DIAGNOSIS — Z21 Asymptomatic human immunodeficiency virus [HIV] infection status: Secondary | ICD-10-CM | POA: Insufficient documentation

## 2015-01-30 DIAGNOSIS — G8918 Other acute postprocedural pain: Secondary | ICD-10-CM | POA: Insufficient documentation

## 2015-01-30 DIAGNOSIS — Z720 Tobacco use: Secondary | ICD-10-CM | POA: Insufficient documentation

## 2015-01-30 DIAGNOSIS — K5900 Constipation, unspecified: Secondary | ICD-10-CM | POA: Insufficient documentation

## 2015-01-30 DIAGNOSIS — Z792 Long term (current) use of antibiotics: Secondary | ICD-10-CM | POA: Insufficient documentation

## 2015-01-30 DIAGNOSIS — Z7952 Long term (current) use of systemic steroids: Secondary | ICD-10-CM | POA: Insufficient documentation

## 2015-01-30 DIAGNOSIS — G8929 Other chronic pain: Secondary | ICD-10-CM | POA: Insufficient documentation

## 2015-01-30 DIAGNOSIS — F419 Anxiety disorder, unspecified: Secondary | ICD-10-CM | POA: Insufficient documentation

## 2015-01-30 DIAGNOSIS — F319 Bipolar disorder, unspecified: Secondary | ICD-10-CM | POA: Insufficient documentation

## 2015-01-30 DIAGNOSIS — Z8719 Personal history of other diseases of the digestive system: Secondary | ICD-10-CM

## 2015-01-30 DIAGNOSIS — G709 Myoneural disorder, unspecified: Secondary | ICD-10-CM | POA: Insufficient documentation

## 2015-01-30 DIAGNOSIS — G8928 Other chronic postprocedural pain: Secondary | ICD-10-CM

## 2015-01-30 DIAGNOSIS — Z8619 Personal history of other infectious and parasitic diseases: Secondary | ICD-10-CM | POA: Insufficient documentation

## 2015-01-30 DIAGNOSIS — Z79899 Other long term (current) drug therapy: Secondary | ICD-10-CM | POA: Insufficient documentation

## 2015-01-30 DIAGNOSIS — K625 Hemorrhage of anus and rectum: Secondary | ICD-10-CM | POA: Insufficient documentation

## 2015-01-30 MED ORDER — TRAMADOL HCL 50 MG PO TABS: 50.0000 mg | ORAL_TABLET | Freq: Two times a day (BID) | ORAL | Status: DC | PRN

## 2015-01-30 MED ORDER — POLYETHYLENE GLYCOL 3350 17 G PO PACK: 17.0000 g | PACK | Freq: Every day | ORAL | Status: DC

## 2015-01-30 NOTE — ED Notes (Addendum)
Pt presents to department requesting medication refill. States he had surgery to gluteal abscess on 12/28/14, states he ran out of pain medications at home, was seen by surgeon this week, states wound is healing appropriately. No signs of distress noted.

## 2015-01-30 NOTE — ED Provider Notes (Signed)
CSN: [id number]     Arrival date & time 01/30/15  1146 History  This chart was scribed for non-physician practitioner, Allen Derry, PA-C,working with Toy Cookey, MD, by Karle Plumber, ED Scribe. This patient was seen in room TR11C/TR11C and the patient's care was started at 12:04 PM.  Chief Complaint  Patient presents with  . Medication Refill   Patient is a 24 y.o. male presenting with wound check. The history is provided by the patient. No language interpreter was used.  Wound Check This is a recurrent problem. The current episode started more than 1 month ago. The problem occurs constantly. The problem has been gradually improving. Pertinent negatives include no abdominal pain, arthralgias, chest pain, chills, fever, myalgias, nausea, vomiting or weakness. Exacerbated by: defecating. He has tried oral narcotics and NSAIDs for the symptoms. The treatment provided significant relief.    HPI Comments:  Justin Ball is a 25 y.o. male with a PMHx of HIV, syphilis, bipolar disorder, substance abuse, depression, and anxiety and a recent rectal abscess s/p I&D by Dr. Michaell Cowing, who presents to the Emergency Department wanting a refill of Percocet and Ultram for surgery that he had on about one month ago (12/28/14). He reports severe stabbing pain of the wound on his gluteal region that he rates "11/10". Pt states the pain is the same now that it was when he first had the surgery. Reports intermittent bleeding from the wound. Walking, passing gas, and having bowel movements makes the pain worse. The Percocet, Ultram, and Ibuprofen help makes the pain better. States he saw his surgeon 4 days ago who stated that his wound was healing well, but at that time he didn't need pain refills therefore he didn't ask for any from the surgeon. States he's had some constipation since surgery. Denies fever, chills, light-headedness, dizziness, SOB, CP, abdominal pain, nausea, vomiting, pus drainage of the wound,  rectal bleeding, melena, or urinary symptoms.    Past Medical History  Diagnosis Date  . HIV infection   . Syphilis   . Scabies   . Bipolar 1 disorder   . Neuromuscular disorder   . Substance abuse   . Depression   . Anxiety    Past Surgical History  Procedure Laterality Date  . Incision and drainage perirectal abscess  08/27/2010    Dr Carolynne Edouard  . Incision and drainage perirectal abscess N/A 12/28/2014    Procedure: IRRIGATION AND DEBRIDEMENT PERIRECTAL ABSCESS;  Surgeon: Karie Soda, MD;  Location: [facility];  Service: General;  Laterality: N/A;  Fistula repair and ablation  . Examination under anesthesia N/A 12/28/2014    Procedure: EXAM UNDER ANESTHESIA;  Surgeon: Karie Soda, MD;  Location: [facility];  Service: General;  Laterality: N/A;   Family History  Problem Relation Age of Onset  . Hypertension Mother    History  Substance Use Topics  . Smoking status: Current Every Day Smoker -- 0.25 packs/day    Types: Cigarettes  . Smokeless tobacco: Never Used  . Alcohol Use: No    Review of Systems  Constitutional: Negative for fever and chills.  Respiratory: Negative for shortness of breath.   Cardiovascular: Negative for chest pain.  Gastrointestinal: Positive for constipation, anal bleeding (intermittent bleeding from surgical incision next to anus) and rectal pain (from surgical incision). Negative for nausea, vomiting, abdominal pain, diarrhea and blood in stool.  Genitourinary: Negative for dysuria, hematuria and discharge.  Musculoskeletal: Negative for myalgias, back pain and arthralgias.  Skin: Positive for wound.  Neurological: Negative  for weakness and light-headedness.  Hematological: Does not bruise/bleed easily.   A complete 10 system review of systems was obtained and all systems are negative except as noted in the HPI and PMH.   Allergies  Review of patient's allergies indicates no known allergies.  Home Medications   Prior to Admission medications   Medication  Sig Start Date End Date Taking? Authorizing Provider  azithromycin (ZITHROMAX) 600 MG tablet Take 2 tablets (1,200 mg total) by mouth once a week. 01/19/15   Randall Hissornelius N Van Dam, MD  citalopram (CELEXA) 20 MG tablet Take 20 mg by mouth daily.    Historical Provider, MD  dolutegravir (TIVICAY) 50 MG tablet Take 1 tablet (50 mg total) by mouth daily. 01/26/15   Cliffton AstersJohn Campbell, MD  emtricitabine-tenofovir (TRUVADA) 200-300 MG per tablet Take 1 tablet by mouth daily. 01/26/15   Cliffton AstersJohn Campbell, MD  hydrocortisone (ANUSOL-HC) 25 MG suppository Place 1 suppository (25 mg total) rectally 2 (two) times daily. 05/30/13   Hayden Rasmussenavid Mabe, NP  hydrOXYzine (ATARAX/VISTARIL) 25 MG tablet Take 1 tablet (25 mg total) by mouth every 6 (six) hours as needed for itching. 01/07/15   Rolland PorterMark James, MD  Lurasidone HCl (LATUDA) 20 MG TABS Take 10 tablets (200 mg total) by mouth daily. 10/06/13   Randall Hissornelius N Van Dam, MD  oxyCODONE-acetaminophen (PERCOCET/ROXICET) 5-325 MG per tablet Take 1-2 tablets by mouth every 4 (four) hours as needed for moderate pain. 12/30/14   Sherrie GeorgeWillard Jennings, PA-C  promethazine (PHENERGAN) 50 MG tablet Take 1 tablet (50 mg total) by mouth as directed. Patient taking differently: Take 50 mg by mouth daily.  10/06/13   Randall Hissornelius N Van Dam, MD  psyllium (HYDROCIL/METAMUCIL) 95 % PACK Use this to keep your stools soft and decrease stress on your rectum. 12/30/14   Sherrie GeorgeWillard Jennings, PA-C  saccharomyces boulardii (FLORASTOR) 250 MG capsule You can buy this over the counter and take as directed on package. Patient taking differently: Take 250 mg by mouth daily as needed (constipation/ digestive health). You can buy this over the counter and take as directed on package. 12/30/14   Sherrie GeorgeWillard Jennings, PA-C  sulfamethoxazole-trimethoprim (BACTRIM DS,SEPTRA DS) 800-160 MG per tablet Take 1 tablet by mouth daily. 01/19/15   Randall Hissornelius N Van Dam, MD  temazepam (RESTORIL) 15 MG capsule Take 1 capsule (15 mg total) by mouth at bedtime as  needed for sleep. 10/06/13   Randall Hissornelius N Van Dam, MD  traMADol (ULTRAM) 50 MG tablet Take 1-2 tablets (50-100 mg total) by mouth daily. 12/28/14   Karie SodaSteven Gross, MD  triamcinolone ointment (KENALOG) 0.5 % Apply topically 2 (two) times daily. Patient not taking: Reported on 12/28/2014 01/06/13   Randall Hissornelius N Van Dam, MD   Triage Vitals: BP 112/73 mmHg  Pulse 98  Temp(Src) 98.8 F (37.1 C) (Oral)  Resp 18  SpO2 99% Physical Exam  Constitutional: He is oriented to person, place, and time. Vital signs are normal. He appears well-developed and well-nourished. No distress.  Afebrile nontoxic NAD  HENT:  Head: Normocephalic and atraumatic.  Mouth/Throat: Mucous membranes are normal.  Eyes: Conjunctivae and EOM are normal. Right eye exhibits no discharge. Left eye exhibits no discharge.  Neck: Normal range of motion. Neck supple.  Cardiovascular: Normal rate.   Pulmonary/Chest: Effort normal. No respiratory distress.  Abdominal: Soft. Normal appearance. He exhibits no distension. There is no tenderness.  Genitourinary: Rectal exam shows tenderness. Rectal exam shows no external hemorrhoid, no internal hemorrhoid, no fissure, no mass and anal tone normal.  Chaperone present 2 areas of well healing granular tissue located to b/l buttocks (see diagram), no erythema or warmth, no induration or fluctuance, no bleeding or drainage. Good anal tone. No gross blood on rectal exam, no hemorrhoids, no mass or fissure.   Musculoskeletal: Normal range of motion.  Neurological: He is alert and oriented to person, place, and time.  Skin: Skin is warm and dry. No erythema.  Psychiatric: He has a normal mood and affect. His behavior is normal.  Nursing note and vitals reviewed.   ED Course  Procedures (including critical care time) DIAGNOSTIC STUDIES: Oxygen Saturation is 99% on RA, normal by my interpretation.   COORDINATION OF CARE: 12:13 PM- Will prescribe Ultram and advised pt to stop taking  Ibuprofen due to bleeding and take Tylenol. Will taper pt off of Percocet. Informed pt to see surgeon again for follow up and use Miralax for softer stool. Pt verbalizes understanding and agrees to plan.  Medications - No data to display  Labs Review Labs Reviewed - No data to display  Imaging Review No results found.   EKG Interpretation None      MDM   Final diagnoses:  Chronic post-operative pain  History of rectal abscess    24 y.o. male with hx of rectal abscess s/p I&D 1 month ago. Seen by surgeon 4 days ago who reported the area appears to be healing well. At that time he had pain meds left and didn't ask for any. He has run out today. Area today appears to be well healing with granulation tissue and no fluctuance or induration. No drainage. No bleeding noted. Discussed that percocet will cause worsening constipation and more pain with defecation. Agreed to fill tramadol but discussed tylenol use to help with pain. Discussed sitz baths. Discussed miralax to help with stool softening to ease defecation. Will have him f/up with surgeon on Monday. I explained the diagnosis and have given explicit precautions to return to the ER including for any other new or worsening symptoms. The patient understands and accepts the medical plan as it's been dictated and I have answered their questions. Discharge instructions concerning home care and prescriptions have been given. The patient is STABLE and is discharged to home in good condition.   I personally performed the services described in this documentation, which was scribed in my presence. The recorded information has been reviewed and is accurate.  BP 112/73 mmHg  Pulse 98  Temp(Src) 98.8 F (37.1 C) (Oral)  Resp 18  SpO2 99%  Meds ordered this encounter  Medications  . traMADol (ULTRAM) 50 MG tablet    Sig: Take 1 tablet (50 mg total) by mouth every 12 (twelve) hours as needed for severe pain.    Dispense:  15 tablet    Refill:   0    Order Specific Question:  Supervising Provider    Answer:  Eber Hong D [[id number]]  . polyethylene glycol (MIRALAX / GLYCOLAX) packet    Sig: Take 17 g by mouth daily.    Dispense:  14 each    Refill:  0    Order Specific Question:  Supervising Provider    Answer:  Vida Roller [address] [place], PA-C 01/30/15 1229  Toy Cookey, MD 01/30/15 [phone]

## 2015-01-30 NOTE — ED Notes (Signed)
Medication refill on Percocet and Ultram.  Pt seen surgeon 01-26-15 for check up.  Pt also having bleeding at site onset today.

## 2015-01-30 NOTE — Discharge Instructions (Signed)
Use tramadol and tylenol for pain, but don't drive while taking tramadol. Use miralax to help soften your stool. Perform sitz baths 4 times daily to help with pain. Keep the area clean and dry. See your surgeon on Monday for ongoing care. Return to the ER for changes or worsening symptoms.   Chronic Pain Chronic pain can be defined as pain that is off and on and lasts for 3-6 months or longer. Many things cause chronic pain, which can make it difficult to make a diagnosis. There are many treatment options available for chronic pain. However, finding a treatment that works well for you may require trying various approaches until the right one is found. Many people benefit from a combination of two or more types of treatment to control their pain. SYMPTOMS  Chronic pain can occur anywhere in the body and can range from mild to very severe. Some types of chronic pain include:  Headache.  Low back pain.  Cancer pain.  Arthritis pain.  Neurogenic pain. This is pain resulting from damage to nerves. People with chronic pain may also have other symptoms such as:  Depression.  Anger.  Insomnia.  Anxiety. DIAGNOSIS  Your health care provider will help diagnose your condition over time. In many cases, the initial focus will be on excluding possible conditions that could be causing the pain. Depending on your symptoms, your health care provider may order tests to diagnose your condition. Some of these tests may include:   Blood tests.   CT scan.   MRI.   X-rays.   Ultrasounds.   Nerve conduction studies.  You may need to see a specialist.  TREATMENT  Finding treatment that works well may take time. You may be referred to a pain specialist. He or she may prescribe medicine or therapies, such as:   Mindful meditation or yoga.  Shots (injections) of numbing or pain-relieving medicines into the spine or area of pain.  Local electrical stimulation.  Acupuncture.   Massage  therapy.   Aroma, color, light, or sound therapy.   Biofeedback.   Working with a physical therapist to keep from getting stiff.   Regular, gentle exercise.   Cognitive or behavioral therapy.   Group support.  Sometimes, surgery may be recommended.  HOME CARE INSTRUCTIONS   Take all medicines as directed by your health care provider.   Lessen stress in your life by relaxing and doing things such as listening to calming music.   Exercise or be active as directed by your health care provider.   Eat a healthy diet and include things such as vegetables, fruits, fish, and lean meats in your diet.   Keep all follow-up appointments with your health care provider.   Attend a support group with others suffering from chronic pain. SEEK MEDICAL CARE IF:   Your pain gets worse.   You develop a new pain that was not there before.   You cannot tolerate medicines given to you by your health care provider.   You have new symptoms since your last visit with your health care provider.  SEEK IMMEDIATE MEDICAL CARE IF:   You feel weak.   You have decreased sensation or numbness.   You lose control of bowel or bladder function.   Your pain suddenly gets much worse.   You develop shaking.  You develop chills.  You develop confusion.  You develop chest pain.  You develop shortness of breath.  MAKE SURE YOU:  Understand these instructions.  Will  watch your condition.  Will get help right away if you are not doing well or get worse. Document Released: 09/02/2002 Document Revised: 08/13/2013 Document Reviewed: 06/06/2013 [facility] Patient Information 2015 [place], [place]. This information is not intended to replace advice given to you by your health care provider. Make sure you discuss any questions you have with your health care provider.

## 2015-01-30 NOTE — ED Notes (Signed)
Declined W/C at D/C and was escorted to lobby by RN. 

## 2015-02-01 ENCOUNTER — Ambulatory Visit: Payer: Self-pay | Admitting: Family Medicine

## 2015-02-10 ENCOUNTER — Ambulatory Visit: Payer: Self-pay | Admitting: Family Medicine

## 2015-02-24 ENCOUNTER — Emergency Department (HOSPITAL_COMMUNITY)
Admission: EM | Admit: 2015-02-24 | Discharge: 2015-02-24 | Disposition: A | Discharge status: Home / Self Care | Payer: Self-pay | Attending: Emergency Medicine | Admitting: Emergency Medicine

## 2015-02-24 ENCOUNTER — Encounter (HOSPITAL_COMMUNITY): Payer: Self-pay

## 2015-02-24 DIAGNOSIS — R369 Urethral discharge, unspecified: Secondary | ICD-10-CM | POA: Insufficient documentation

## 2015-02-24 DIAGNOSIS — R6883 Chills (without fever): Secondary | ICD-10-CM | POA: Insufficient documentation

## 2015-02-24 DIAGNOSIS — Z202 Contact with and (suspected) exposure to infections with a predominantly sexual mode of transmission: Secondary | ICD-10-CM | POA: Insufficient documentation

## 2015-02-24 DIAGNOSIS — Z792 Long term (current) use of antibiotics: Secondary | ICD-10-CM | POA: Insufficient documentation

## 2015-02-24 DIAGNOSIS — F419 Anxiety disorder, unspecified: Secondary | ICD-10-CM | POA: Insufficient documentation

## 2015-02-24 DIAGNOSIS — Z79899 Other long term (current) drug therapy: Secondary | ICD-10-CM | POA: Insufficient documentation

## 2015-02-24 DIAGNOSIS — Z4801 Encounter for change or removal of surgical wound dressing: Secondary | ICD-10-CM | POA: Insufficient documentation

## 2015-02-24 DIAGNOSIS — Z7952 Long term (current) use of systemic steroids: Secondary | ICD-10-CM | POA: Insufficient documentation

## 2015-02-24 DIAGNOSIS — F3131 Bipolar disorder, current episode depressed, mild: Secondary | ICD-10-CM | POA: Insufficient documentation

## 2015-02-24 DIAGNOSIS — Z5189 Encounter for other specified aftercare: Secondary | ICD-10-CM

## 2015-02-24 DIAGNOSIS — Z720 Tobacco use: Secondary | ICD-10-CM | POA: Insufficient documentation

## 2015-02-24 DIAGNOSIS — Z8619 Personal history of other infectious and parasitic diseases: Secondary | ICD-10-CM | POA: Insufficient documentation

## 2015-02-24 DIAGNOSIS — Z8669 Personal history of other diseases of the nervous system and sense organs: Secondary | ICD-10-CM | POA: Insufficient documentation

## 2015-02-24 DIAGNOSIS — Z21 Asymptomatic human immunodeficiency virus [HIV] infection status: Secondary | ICD-10-CM | POA: Insufficient documentation

## 2015-02-24 MED ORDER — AZITHROMYCIN 250 MG PO TABS
1000.0000 mg | ORAL_TABLET | Freq: Once | ORAL | Status: AC
  Administered 2015-02-24: 1000 mg via ORAL
  Filled 2015-02-24: qty 4

## 2015-02-24 MED ORDER — CEFTRIAXONE SODIUM 250 MG IJ SOLR
250.0000 mg | Freq: Once | INTRAMUSCULAR | Status: AC
  Administered 2015-02-24: 250 mg via INTRAMUSCULAR
  Filled 2015-02-24: qty 250

## 2015-02-24 NOTE — ED Notes (Signed)
Patient waiting for thirty minutes to approve no reaction.
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
## 2015-02-24 NOTE — ED Notes (Signed)
Pt. Reports here for STD check. Reports partner has swelling to tip of penis, yellow discharge and burning. Denies any of these symptoms

## 2015-02-24 NOTE — ED Notes (Signed)
No reaction to Rocephin noted.

## 2015-02-25 LAB — GC/CHLAMYDIA PROBE AMP (~~LOC~~) NOT AT ARMC
Chlamydia: POSITIVE — AB
Neisseria Gonorrhea: POSITIVE — AB

## 2015-02-27 ENCOUNTER — Telehealth (HOSPITAL_COMMUNITY): Payer: Self-pay | Admitting: *Deleted

## 2015-04-28 ENCOUNTER — Other Ambulatory Visit: Payer: Self-pay | Admitting: Infectious Disease

## 2015-04-28 ENCOUNTER — Encounter: Payer: Self-pay | Admitting: Licensed Clinical Social Worker

## 2015-04-28 ENCOUNTER — Other Ambulatory Visit (INDEPENDENT_AMBULATORY_CARE_PROVIDER_SITE_OTHER): Payer: Self-pay

## 2015-04-28 DIAGNOSIS — F319 Bipolar disorder, unspecified: Secondary | ICD-10-CM

## 2015-04-28 DIAGNOSIS — Z23 Encounter for immunization: Secondary | ICD-10-CM

## 2015-04-28 DIAGNOSIS — B20 Human immunodeficiency virus [HIV] disease: Secondary | ICD-10-CM

## 2015-04-28 LAB — CBC WITH DIFFERENTIAL/PLATELET
Basophils Absolute: 0 10*3/uL (ref 0.0–0.1)
Basophils Relative: 0 % (ref 0–1)
Eosinophils Absolute: 0.1 10*3/uL (ref 0.0–0.7)
Eosinophils Relative: 2 % (ref 0–5)
HCT: 44.5 % (ref 39.0–52.0)
Hemoglobin: 15.7 g/dL (ref 13.0–17.0)
LYMPHS PCT: 48 % — AB (ref 12–46)
Lymphs Abs: 1.3 10*3/uL (ref 0.7–4.0)
MCH: 31.7 pg (ref 26.0–34.0)
MCHC: 35.3 g/dL (ref 30.0–36.0)
MCV: 89.9 fL (ref 78.0–100.0)
MONO ABS: 0.2 10*3/uL (ref 0.1–1.0)
MPV: 9.7 fL (ref 8.6–12.4)
Monocytes Relative: 7 % (ref 3–12)
NEUTROS ABS: 1.2 10*3/uL — AB (ref 1.7–7.7)
Neutrophils Relative %: 43 % (ref 43–77)
Platelets: 278 10*3/uL (ref 150–400)
RBC: 4.95 MIL/uL (ref 4.22–5.81)
RDW: 14.5 % (ref 11.5–15.5)
WBC: 2.8 10*3/uL — ABNORMAL LOW (ref 4.0–10.5)

## 2015-04-28 LAB — COMPLETE METABOLIC PANEL WITH GFR
ALT: 40 U/L (ref 0–53)
AST: 38 U/L — AB (ref 0–37)
Albumin: 3.9 g/dL (ref 3.5–5.2)
Alkaline Phosphatase: 105 U/L (ref 39–117)
BUN: 7 mg/dL (ref 6–23)
CO2: 26 mEq/L (ref 19–32)
CREATININE: 0.85 mg/dL (ref 0.50–1.35)
Calcium: 8.9 mg/dL (ref 8.4–10.5)
Chloride: 106 mEq/L (ref 96–112)
GFR, Est Non African American: >89 mL/min
Glucose, Bld: 81 mg/dL (ref 70–99)
Potassium: 3.9 mEq/L (ref 3.5–5.3)
Sodium: 140 mEq/L (ref 135–145)
Total Bilirubin: 0.9 mg/dL (ref 0.2–1.2)
Total Protein: 8.2 g/dL (ref 6.0–8.3)

## 2015-04-28 LAB — PHOSPHORUS: Phosphorus: 3.5 mg/dL (ref 2.3–4.6)

## 2015-04-29 LAB — T-HELPER CELL (CD4) - (RCID CLINIC ONLY)
CD4 % Helper T Cell: 7 % — ABNORMAL LOW (ref 33–55)
CD4 T Cell Abs: 90 /uL — ABNORMAL LOW (ref 400–2700)

## 2015-04-29 LAB — HIV-1 RNA QUANT-NO REFLEX-BLD
HIV 1 RNA QUANT: 39128 {copies}/mL — AB (ref <20)
HIV-1 RNA Quant, Log: 4.59 {Log} — ABNORMAL HIGH (ref <1.30)

## 2015-05-03 ENCOUNTER — Other Ambulatory Visit: Payer: Self-pay | Admitting: Infectious Disease

## 2015-05-03 ENCOUNTER — Other Ambulatory Visit: Payer: Self-pay

## 2015-05-03 DIAGNOSIS — B20 Human immunodeficiency virus [HIV] disease: Secondary | ICD-10-CM

## 2015-05-03 NOTE — Progress Notes (Signed)
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

## 2015-05-03 NOTE — Progress Notes (Signed)
Spoke with Justin MoralesK. Green.  She will add these to pt's previous HIV RNA.

## 2015-05-13 LAB — HIV-1 INTEGRASE GENOTYPE

## 2015-05-14 LAB — HIV-1 GENOTYPR PLUS

## 2015-05-17 ENCOUNTER — Ambulatory Visit: Payer: Self-pay | Admitting: Infectious Disease

## 2015-07-23 ENCOUNTER — Emergency Department (HOSPITAL_COMMUNITY)
Admission: EM | Admit: 2015-07-23 | Discharge: 2015-07-23 | Disposition: A | Discharge status: Home / Self Care | Payer: Self-pay | Attending: Emergency Medicine | Admitting: Emergency Medicine

## 2015-07-23 ENCOUNTER — Encounter (HOSPITAL_COMMUNITY): Payer: Self-pay

## 2015-07-23 DIAGNOSIS — F419 Anxiety disorder, unspecified: Secondary | ICD-10-CM | POA: Insufficient documentation

## 2015-07-23 DIAGNOSIS — K047 Periapical abscess without sinus: Secondary | ICD-10-CM | POA: Insufficient documentation

## 2015-07-23 DIAGNOSIS — Z79899 Other long term (current) drug therapy: Secondary | ICD-10-CM | POA: Insufficient documentation

## 2015-07-23 DIAGNOSIS — Z720 Tobacco use: Secondary | ICD-10-CM | POA: Insufficient documentation

## 2015-07-23 DIAGNOSIS — F319 Bipolar disorder, unspecified: Secondary | ICD-10-CM | POA: Insufficient documentation

## 2015-07-23 DIAGNOSIS — Z21 Asymptomatic human immunodeficiency virus [HIV] infection status: Secondary | ICD-10-CM | POA: Insufficient documentation

## 2015-07-23 DIAGNOSIS — Z8619 Personal history of other infectious and parasitic diseases: Secondary | ICD-10-CM | POA: Insufficient documentation

## 2015-07-23 MED ORDER — TRAMADOL HCL 50 MG PO TABS
50.0000 mg | ORAL_TABLET | Freq: Once | ORAL | Status: AC
  Administered 2015-07-23: 50 mg via ORAL
  Filled 2015-07-23: qty 1

## 2015-07-23 MED ORDER — PENICILLIN V POTASSIUM 250 MG PO TABS
500.0000 mg | ORAL_TABLET | Freq: Once | ORAL | Status: AC
  Administered 2015-07-23: 500 mg via ORAL
  Filled 2015-07-23: qty 2

## 2015-07-23 MED ORDER — PENICILLIN V POTASSIUM 500 MG PO TABS: 500.0000 mg | ORAL_TABLET | Freq: Four times a day (QID) | ORAL | Status: AC

## 2015-07-23 NOTE — ED Notes (Signed)
Pt verbalized understanding of d/c instructions and has no further questions. Pt stable and in NAD upon d/c.  

## 2015-07-23 NOTE — ED Notes (Signed)
Pt woke up around 0200 this am and woke up with dental pain/burning on the left bottom of the mouth where his wisdom tooth is.

## 2015-07-23 NOTE — Discharge Instructions (Signed)
Abscessed Tooth Justin Ball, take antibiotics as prescribed for possible infection of your tooth. See a dentist within 3 days for close follow-up as this mass needs to be evaluated in your mouth. Use Tylenol, 1000 mg every 8 hours for pain control. If symptoms worsen come back to emergency department immediately. Thank you.   An abscessed tooth is an infection around your tooth. It may be caused by holes or damage to the tooth (cavity) or a dental disease. An abscessed tooth causes mild to very bad pain in and around the tooth. See your dentist right away if you have tooth or gum pain. HOME CARE  Take your medicine as told. Finish it even if you start to feel better.  Do not drive after taking pain medicine.  Rinse your mouth (gargle) often with salt water ( teaspoon salt in 8 ounces of warm water).  Do not apply heat to the outside of your face. GET HELP RIGHT AWAY IF:   You have a temperature by mouth above 102 F (38.9 C), not controlled by medicine.  You have chills and a very bad headache.  You have problems breathing or swallowing.  Your mouth will not open.  You develop puffiness (swelling) on the neck or around the eye.  Your pain is not helped by medicine.  Your pain is getting worse instead of better. MAKE SURE YOU:   Understand these instructions.  Will watch your condition.  Will get help right away if you are not doing well or get worse. Document Released: 05/29/2008 Document Revised: 03/04/2012 Document Reviewed: 03/21/2011 [facility] Patient Information 2015 [place], [place]. This information is not intended to replace advice given to you by your health care provider. Make sure you discuss any questions you have with your health care provider.
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
## 2015-09-29 ENCOUNTER — Emergency Department (HOSPITAL_COMMUNITY)
Admission: EM | Admit: 2015-09-29 | Discharge: 2015-09-29 | Disposition: A | Discharge status: Home / Self Care | Payer: Self-pay | Attending: Emergency Medicine | Admitting: Emergency Medicine

## 2015-09-29 ENCOUNTER — Encounter (HOSPITAL_COMMUNITY): Payer: Self-pay | Admitting: Emergency Medicine

## 2015-09-29 DIAGNOSIS — F319 Bipolar disorder, unspecified: Secondary | ICD-10-CM | POA: Insufficient documentation

## 2015-09-29 DIAGNOSIS — Z79899 Other long term (current) drug therapy: Secondary | ICD-10-CM | POA: Insufficient documentation

## 2015-09-29 DIAGNOSIS — S80212A Abrasion, left knee, initial encounter: Secondary | ICD-10-CM | POA: Insufficient documentation

## 2015-09-29 DIAGNOSIS — F419 Anxiety disorder, unspecified: Secondary | ICD-10-CM | POA: Insufficient documentation

## 2015-09-29 DIAGNOSIS — T07XXXA Unspecified multiple injuries, initial encounter: Secondary | ICD-10-CM

## 2015-09-29 DIAGNOSIS — S00511A Abrasion of lip, initial encounter: Secondary | ICD-10-CM | POA: Insufficient documentation

## 2015-09-29 DIAGNOSIS — S50812A Abrasion of left forearm, initial encounter: Secondary | ICD-10-CM | POA: Insufficient documentation

## 2015-09-29 DIAGNOSIS — W503XXA Accidental bite by another person, initial encounter: Secondary | ICD-10-CM

## 2015-09-29 DIAGNOSIS — Z792 Long term (current) use of antibiotics: Secondary | ICD-10-CM | POA: Insufficient documentation

## 2015-09-29 DIAGNOSIS — Y998 Other external cause status: Secondary | ICD-10-CM | POA: Insufficient documentation

## 2015-09-29 DIAGNOSIS — S61251A Open bite of left index finger without damage to nail, initial encounter: Secondary | ICD-10-CM | POA: Insufficient documentation

## 2015-09-29 DIAGNOSIS — Y9389 Activity, other specified: Secondary | ICD-10-CM | POA: Insufficient documentation

## 2015-09-29 DIAGNOSIS — B20 Human immunodeficiency virus [HIV] disease: Secondary | ICD-10-CM | POA: Insufficient documentation

## 2015-09-29 DIAGNOSIS — Z720 Tobacco use: Secondary | ICD-10-CM | POA: Insufficient documentation

## 2015-09-29 DIAGNOSIS — S199XXA Unspecified injury of neck, initial encounter: Secondary | ICD-10-CM | POA: Insufficient documentation

## 2015-09-29 DIAGNOSIS — S61412A Laceration without foreign body of left hand, initial encounter: Secondary | ICD-10-CM

## 2015-09-29 DIAGNOSIS — Y9289 Other specified places as the place of occurrence of the external cause: Secondary | ICD-10-CM | POA: Insufficient documentation

## 2015-09-29 MED ORDER — IBUPROFEN 800 MG PO TABS: 800.0000 mg | ORAL_TABLET | Freq: Three times a day (TID) | ORAL | Status: DC

## 2015-09-29 MED ORDER — AMOXICILLIN-POT CLAVULANATE 875-125 MG PO TABS: 1.0000 | ORAL_TABLET | Freq: Two times a day (BID) | ORAL | Status: DC

## 2015-09-29 MED ORDER — IBUPROFEN 800 MG PO TABS
800.0000 mg | ORAL_TABLET | Freq: Once | ORAL | Status: AC
  Administered 2015-09-29: 800 mg via ORAL
  Filled 2015-09-29: qty 1

## 2015-09-29 MED ORDER — LIDOCAINE HCL 1 % IJ SOLN
INTRAMUSCULAR | Status: AC
  Filled 2015-09-29: qty 20

## 2015-09-29 MED ORDER — LIDOCAINE HCL (PF) 1 % IJ SOLN: 5.0000 mL | Freq: Once | INTRAMUSCULAR | Status: DC
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
## 2015-09-29 NOTE — Discharge Instructions (Signed)
Take the prescribed medication as directed. Follow-up with urgent care for suture removal in 1 week. Return to the ED for new or worsening symptoms.

## 2015-10-01 ENCOUNTER — Encounter (HOSPITAL_COMMUNITY): Payer: Self-pay | Admitting: Emergency Medicine

## 2015-10-01 ENCOUNTER — Emergency Department (HOSPITAL_COMMUNITY)
Admission: EM | Admit: 2015-10-01 | Discharge: 2015-10-01 | Disposition: A | Discharge status: Home / Self Care | Payer: Self-pay | Attending: Emergency Medicine | Admitting: Emergency Medicine

## 2015-10-01 DIAGNOSIS — Z720 Tobacco use: Secondary | ICD-10-CM | POA: Insufficient documentation

## 2015-10-01 DIAGNOSIS — Z792 Long term (current) use of antibiotics: Secondary | ICD-10-CM | POA: Insufficient documentation

## 2015-10-01 DIAGNOSIS — Z8669 Personal history of other diseases of the nervous system and sense organs: Secondary | ICD-10-CM | POA: Insufficient documentation

## 2015-10-01 DIAGNOSIS — Z87828 Personal history of other (healed) physical injury and trauma: Secondary | ICD-10-CM | POA: Insufficient documentation

## 2015-10-01 DIAGNOSIS — M791 Myalgia: Secondary | ICD-10-CM | POA: Insufficient documentation

## 2015-10-01 DIAGNOSIS — F329 Major depressive disorder, single episode, unspecified: Secondary | ICD-10-CM | POA: Insufficient documentation

## 2015-10-01 DIAGNOSIS — Z79899 Other long term (current) drug therapy: Secondary | ICD-10-CM | POA: Insufficient documentation

## 2015-10-01 DIAGNOSIS — M7918 Myalgia, other site: Secondary | ICD-10-CM

## 2015-10-01 DIAGNOSIS — Z791 Long term (current) use of non-steroidal anti-inflammatories (NSAID): Secondary | ICD-10-CM | POA: Insufficient documentation

## 2015-10-01 DIAGNOSIS — Z21 Asymptomatic human immunodeficiency virus [HIV] infection status: Secondary | ICD-10-CM | POA: Insufficient documentation

## 2015-10-01 DIAGNOSIS — F419 Anxiety disorder, unspecified: Secondary | ICD-10-CM | POA: Insufficient documentation

## 2015-10-01 MED ORDER — IBUPROFEN 200 MG PO TABS: 400.0000 mg | ORAL_TABLET | Freq: Once | ORAL | Status: DC

## 2015-10-01 MED ORDER — ACETAMINOPHEN 325 MG PO TABS: 650.0000 mg | ORAL_TABLET | Freq: Once | ORAL | Status: DC

## 2015-10-01 NOTE — Discharge Instructions (Signed)
For pain control please take ibuprofen (also known as Motrin or Advil)  (this is normally 4 over the counter pills) 3 times a day  for 5 days. Take with food to minimize stomach irritation.  Take acetaminophen (Tylenol) up to 975 mg (this is normally 3 over-the-counter pills) up to 3 times a day. Do not drink alcohol. Make sure your other medications do not contain acetaminophen (Read the labels!)   Please follow with your primary care doctor in the next 2 days for a check-up. They must obtain records for further management.   Do not hesitate to return to the Emergency Department for any new, worsening or concerning symptoms.   Musculoskeletal Pain Musculoskeletal pain is muscle and boney aches and pains. These pains can occur in any part of the body. Your caregiver may treat you without knowing the cause of the pain. They may treat you if blood or urine tests, X-rays, and other tests were normal.  CAUSES There is often not a definite cause or reason for these pains. These pains may be caused by a type of germ (virus). The discomfort may also come from overuse. Overuse includes working out too hard when your body is not fit. Boney aches also come from weather changes. Bone is sensitive to atmospheric pressure changes. HOME CARE INSTRUCTIONS   Ask when your test results will be ready. Make sure you get your test results.  Only take over-the-counter or prescription medicines for pain, discomfort, or fever as directed by your caregiver. If you were given medications for your condition, do not drive, operate machinery or power tools, or sign legal documents for 24 hours. Do not drink alcohol. Do not take sleeping pills or other medications that may interfere with treatment.  Continue all activities unless the activities cause more pain. When the pain lessens, slowly resume normal activities. Gradually increase the intensity and duration of the activities or exercise.  During periods of severe  pain, bed rest may be helpful. Lay or sit in any position that is comfortable.  Putting ice on the injured area.  Put ice in a bag.  Place a towel between your skin and the bag.  Leave the ice on for 15 to 20 minutes, 3 to 4 times a day.  Follow up with your caregiver for continued problems and no reason can be found for the pain. If the pain becomes worse or does not go away, it may be necessary to repeat tests or do additional testing. Your caregiver may need to look further for a possible cause. SEEK IMMEDIATE MEDICAL CARE IF:  You have pain that is getting worse and is not relieved by medications.  You develop chest pain that is associated with shortness or breath, sweating, feeling sick to your stomach (nauseous), or throw up (vomit).  Your pain becomes localized to the abdomen.  You develop any new symptoms that seem different or that concern you. MAKE SURE YOU:   Understand these instructions.  Will watch your condition.  Will get help right away if you are not doing well or get worse.   This information is not intended to replace advice given to you by your health care provider. Make sure you discuss any questions you have with your health care provider.   Document Released: 12/11/2005 Document Revised: 03/04/2012 Document Reviewed: 08/15/2013 Elsevier Interactive Patient Education [employer]
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
## 2015-10-17 ENCOUNTER — Emergency Department (HOSPITAL_COMMUNITY)
Admission: EM | Admit: 2015-10-17 | Discharge: 2015-10-17 | Disposition: A | Discharge status: Home / Self Care | Payer: Self-pay | Attending: Emergency Medicine | Admitting: Emergency Medicine

## 2015-10-17 ENCOUNTER — Encounter (HOSPITAL_COMMUNITY): Payer: Self-pay | Admitting: Emergency Medicine

## 2015-10-17 DIAGNOSIS — K625 Hemorrhage of anus and rectum: Secondary | ICD-10-CM | POA: Insufficient documentation

## 2015-10-17 DIAGNOSIS — Z791 Long term (current) use of non-steroidal anti-inflammatories (NSAID): Secondary | ICD-10-CM | POA: Insufficient documentation

## 2015-10-17 DIAGNOSIS — Z79899 Other long term (current) drug therapy: Secondary | ICD-10-CM | POA: Insufficient documentation

## 2015-10-17 DIAGNOSIS — Z720 Tobacco use: Secondary | ICD-10-CM | POA: Insufficient documentation

## 2015-10-17 DIAGNOSIS — Z792 Long term (current) use of antibiotics: Secondary | ICD-10-CM | POA: Insufficient documentation

## 2015-10-17 DIAGNOSIS — Z8659 Personal history of other mental and behavioral disorders: Secondary | ICD-10-CM | POA: Insufficient documentation

## 2015-10-17 DIAGNOSIS — B20 Human immunodeficiency virus [HIV] disease: Secondary | ICD-10-CM | POA: Insufficient documentation

## 2015-10-17 DIAGNOSIS — Z8669 Personal history of other diseases of the nervous system and sense organs: Secondary | ICD-10-CM | POA: Insufficient documentation

## 2015-10-17 LAB — POC OCCULT BLOOD, ED: Fecal Occult Bld: NEGATIVE

## 2015-10-17 NOTE — ED Notes (Signed)
Pt states he had a rectal abscess about a year ago that was surgically removed and has had a history of rectal bleeds following that. This time it has been present for about 2 weeks. Pt feels light headed.

## 2015-10-17 NOTE — ED Provider Notes (Signed)
CSN: [id number]     Arrival date & time 10/17/15  2129 History   First MD Initiated Contact with Patient 10/17/15 2148     Chief Complaint  Patient presents with  . Rectal Bleeding     (Consider location/radiation/quality/duration/timing/severity/associated sxs/prior Treatment) HPI..... Rectal bleeding intermittently for several days. Volume of blood loss is minimal. Patient is not weak or syncopal. He is [facility] to [facility], but is noncompliant with his regimen. Past medical history includes HIV, syphilis, bipolar, substance abuse, anal carcinoma.  Past Medical History  Diagnosis Date  . HIV infection (HCC)   . Syphilis   . Scabies   . Bipolar 1 disorder (HCC)   . Neuromuscular disorder (HCC)   . Substance abuse   . Depression   . Anxiety    Past Surgical History  Procedure Laterality Date  . Incision and drainage perirectal abscess  08/27/2010    Dr Carolynne Edouardoth  . Incision and drainage perirectal abscess N/A 12/28/2014    Procedure: IRRIGATION AND DEBRIDEMENT PERIRECTAL ABSCESS;  Surgeon: Karie SodaSteven Gross, MD;  Location: [facility];  Service: General;  Laterality: N/A;  Fistula repair and ablation  . Examination under anesthesia N/A 12/28/2014    Procedure: EXAM UNDER ANESTHESIA;  Surgeon: Karie SodaSteven Gross, MD;  Location: [facility];  Service: General;  Laterality: N/A;   Family History  Problem Relation Age of Onset  . Hypertension Other    Social History  Substance Use Topics  . Smoking status: Current Every Day Smoker -- 0.25 packs/day    Types: Cigarettes  . Smokeless tobacco: Never Used  . Alcohol Use: 0.0 oz/week    Review of Systems  All other systems reviewed and are negative.     Allergies  Review of patient's allergies indicates no known allergies.  Home Medications   Prior to Admission medications   Medication Sig Start Date End Date Taking? Authorizing Provider  dolutegravir (TIVICAY) 50 MG tablet Take 1 tablet (50 mg total) by mouth daily. 01/26/15   Yes Cliffton AstersJohn Campbell, MD  emtricitabine-tenofovir (TRUVADA) 200-300 MG per tablet Take 1 tablet by mouth daily. 01/26/15  Yes Cliffton AstersJohn Campbell, MD  ibuprofen (ADVIL,MOTRIN) 800 MG tablet Take 1 tablet (800 mg total) by mouth 3 (three) times daily. 09/29/15  Yes Garlon HatchetLisa M Sanders, PA-C  sulfamethoxazole-trimethoprim (BACTRIM DS,SEPTRA DS) 800-160 MG per tablet Take 1 tablet by mouth daily. 01/19/15  Yes Randall Hissornelius N Van Dam, MD  amoxicillin-clavulanate (AUGMENTIN) 875-125 MG tablet Take 1 tablet by mouth every 12 (twelve) hours. Patient not taking: Reported on 10/17/2015 09/29/15   Garlon HatchetLisa M Sanders, PA-C  azithromycin (ZITHROMAX) 600 MG tablet Take 2 tablets (1,200 mg total) by mouth once a week. Patient not taking: Reported on 10/17/2015 01/19/15   Randall Hissornelius N Van Dam, MD   BP 113/75 mmHg  Pulse 97  Temp(Src) 98.2 F (36.8 C) (Oral)  Resp 16  SpO2 99% Physical Exam  Constitutional: He is oriented to person, place, and time. He appears well-developed and well-nourished.  No acute distress.  HENT:  Head: Normocephalic and atraumatic.  Eyes: Conjunctivae and EOM are normal. Pupils are equal, round, and reactive to light.  Neck: Normal range of motion. Neck supple.  Cardiovascular: Normal rate and regular rhythm.   Pulmonary/Chest: Effort normal and breath sounds normal.  Abdominal: Soft. Bowel sounds are normal.  Genitourinary:  Rectal exam: No frank masses palpated. Heme-negative.  Musculoskeletal: Normal range of motion.  Neurological: He is alert and oriented to person, place, and  time.  Skin: Skin is warm and dry.  Psychiatric: He has a normal mood and affect. His behavior is normal.  Nursing note and vitals reviewed.   ED Course  Procedures (including critical care time) Labs Review Labs Reviewed  POC OCCULT BLOOD, ED    Imaging Review No results found. I have personally reviewed and evaluated these images and lab results as part of my medical decision-making.   EKG  Interpretation None      MDM   Final diagnoses:  Rectal bleeding  HIV (human immunodeficiency virus infection) (HCC)    Patient has primary care follow-up. Past medical history includes rectal carcinoma and HIV. He is hemodynamically stable with normal vital signs.    Donnetta Hutching, MD 10/17/15 [phone]

## 2015-10-17 NOTE — Discharge Instructions (Signed)
Recommend following up at the HIV doctor for further recommendations.  Rectal exam showed no blood tonight. Review of your chart does reveal an anal cancer

## 2016-01-13 ENCOUNTER — Telehealth: Payer: Self-pay | Admitting: *Deleted

## 2016-01-13 NOTE — Telephone Encounter (Signed)
last appt 12/2014, needs appt.  Unable to contact pt.

## 2016-02-16 ENCOUNTER — Encounter (HOSPITAL_COMMUNITY): Payer: Self-pay | Admitting: *Deleted

## 2016-02-16 ENCOUNTER — Emergency Department (HOSPITAL_COMMUNITY)
Admission: EM | Admit: 2016-02-16 | Discharge: 2016-02-16 | Disposition: A | Discharge status: Home / Self Care | Payer: MEDICAID | Attending: Emergency Medicine | Admitting: Emergency Medicine

## 2016-02-16 DIAGNOSIS — Z872 Personal history of diseases of the skin and subcutaneous tissue: Secondary | ICD-10-CM | POA: Insufficient documentation

## 2016-02-16 DIAGNOSIS — Z792 Long term (current) use of antibiotics: Secondary | ICD-10-CM | POA: Insufficient documentation

## 2016-02-16 DIAGNOSIS — K1370 Unspecified lesions of oral mucosa: Secondary | ICD-10-CM

## 2016-02-16 DIAGNOSIS — Z79899 Other long term (current) drug therapy: Secondary | ICD-10-CM | POA: Insufficient documentation

## 2016-02-16 DIAGNOSIS — Z8659 Personal history of other mental and behavioral disorders: Secondary | ICD-10-CM | POA: Insufficient documentation

## 2016-02-16 DIAGNOSIS — B20 Human immunodeficiency virus [HIV] disease: Secondary | ICD-10-CM | POA: Insufficient documentation

## 2016-02-16 DIAGNOSIS — Z8669 Personal history of other diseases of the nervous system and sense organs: Secondary | ICD-10-CM | POA: Insufficient documentation

## 2016-02-16 DIAGNOSIS — R21 Rash and other nonspecific skin eruption: Secondary | ICD-10-CM | POA: Insufficient documentation

## 2016-02-16 DIAGNOSIS — Z791 Long term (current) use of non-steroidal anti-inflammatories (NSAID): Secondary | ICD-10-CM | POA: Insufficient documentation

## 2016-02-16 DIAGNOSIS — F17210 Nicotine dependence, cigarettes, uncomplicated: Secondary | ICD-10-CM | POA: Insufficient documentation

## 2016-02-16 MED ORDER — PENICILLIN G BENZATHINE 1200000 UNIT/2ML IM SUSP
2.4000 10*6.[IU] | Freq: Once | INTRAMUSCULAR | Status: AC
  Administered 2016-02-16: 2.4 10*6.[IU] via INTRAMUSCULAR
  Filled 2016-02-16: qty 4

## 2016-02-16 NOTE — Discharge Instructions (Signed)
Please call Dr. Clinton Gallant office TOMORROW morning to schedule a follow up appointment for as soon as possible.

## 2016-02-16 NOTE — ED Notes (Addendum)
Pt reports several days of sore gums. Pt was observed eating Ruffle potato chips .

## 2016-02-16 NOTE — ED Notes (Signed)
MD at bedside. 

## 2016-02-16 NOTE — ED Notes (Signed)
Pt reports blisters in mouth

## 2016-02-16 NOTE — ED Provider Notes (Signed)
CSN: [id number]     Arrival date & time 02/16/16  1657 History  By signing my name below, I, Justin Ball, attest that this documentation has been prepared under the direction and in the presence of Natividad Schlosser Y Dave Mergen, [place]. Electronically Signed: Evon Ball, ED Scribe. 02/16/2016. 5:39 PM.      Chief Complaint  Patient presents with  . Oral Swelling    gums   The history is provided by the patient. No language interpreter was used.   HPI Comments: Justin Ball is a 25 y.o. male with history of HIV (uncontrolled), syphillis, substance abuse who presents to the Emergency Department complaining of gingival ulcerative lesions onset 3 weeks prior. Pt reports HX of similar rash that he was treated for in the in the ED with antibiotics. He does not remember which antibiotics. Pt states that he has tried salt water rinses with no relief. Pt also reports itchy rash to his anterior torso and back that began 3 weeks ago as well. Denies fever, trouble swallowing. Denies genital lesions. Denies new products or exposures. Pt does report daily tobacco use. Pt states he has not seen Dr. Synthia Innocent (ID) in over six months. He has not been on any HIV treatment, does not know his last CD4 count.  Past Medical History  Diagnosis Date  . HIV infection (HCC)   . Syphilis   . Scabies   . Bipolar 1 disorder (HCC)   . Neuromuscular disorder (HCC)   . Substance abuse   . Depression   . Anxiety    Past Surgical History  Procedure Laterality Date  . Incision and drainage perirectal abscess  08/27/2010    Dr Carolynne Edouard  . Incision and drainage perirectal abscess N/A 12/28/2014    Procedure: IRRIGATION AND DEBRIDEMENT PERIRECTAL ABSCESS;  Surgeon: Karie Soda, MD;  Location: [facility];  Service: General;  Laterality: N/A;  Fistula repair and ablation  . Examination under anesthesia N/A 12/28/2014    Procedure: EXAM UNDER ANESTHESIA;  Surgeon: Karie Soda, MD;  Location: [facility];  Service: General;  Laterality: N/A;    Family History  Problem Relation Age of Onset  . Hypertension Other    Social History  Substance Use Topics  . Smoking status: Current Every Day Smoker -- 0.25 packs/day    Types: Cigarettes  . Smokeless tobacco: Never Used  . Alcohol Use: 0.0 oz/week    Review of Systems  Constitutional: Negative for fever.  HENT: Negative for trouble swallowing.   Skin: Positive for rash.  All other systems reviewed and are negative.    Allergies  Review of patient's allergies indicates no known allergies.  Home Medications   Prior to Admission medications   Medication Sig Start Date End Date Taking? Authorizing Provider  amoxicillin-clavulanate (AUGMENTIN) 875-125 MG tablet Take 1 tablet by mouth every 12 (twelve) hours. Patient not taking: Reported on 10/17/2015 09/29/15   Garlon Hatchet, PA-C  azithromycin (ZITHROMAX) 600 MG tablet Take 2 tablets (1,200 mg total) by mouth once a week. Patient not taking: Reported on 10/17/2015 01/19/15   Randall Hiss, MD  dolutegravir (TIVICAY) 50 MG tablet Take 1 tablet (50 mg total) by mouth daily. 01/26/15   Cliffton Asters, MD  emtricitabine-tenofovir (TRUVADA) 200-300 MG per tablet Take 1 tablet by mouth daily. 01/26/15   Cliffton Asters, MD  ibuprofen (ADVIL,MOTRIN) 800 MG tablet Take 1 tablet (800 mg total) by mouth 3 (three) times daily. 09/29/15   Garlon Hatchet, PA-C  sulfamethoxazole-trimethoprim (BACTRIM  DS,SEPTRA DS) 800-160 MG per tablet Take 1 tablet by mouth daily. 01/19/15   Randall Hiss, MD   BP 138/92 mmHg  Pulse 84  Temp(Src) 98.3 F (36.8 C) (Oral)  Resp 18  SpO2 100%   Physical Exam  Constitutional: He is oriented to person, place, and time. He appears well-developed and well-nourished. No distress.  HENT:  Head: Normocephalic and atraumatic.  Mouth/Throat: No trismus in the jaw.  Several small ulcerative lesions on buccal surface of lower gingiva. No surrounding erythema. No drainage.   Eyes: Conjunctivae and EOM are  normal.  Neck: Neck supple. No tracheal deviation present.  Cardiovascular: Normal rate.   Pulmonary/Chest: Effort normal. No respiratory distress.  Musculoskeletal: Normal range of motion.  Neurological: He is alert and oriented to person, place, and time.  Skin: Skin is warm and dry.  Multiple ulcerative, raised, some nodular lesions on pt's chest, back, and arms. Some are scabbed over with excoriation marks. All are nontender.  Psychiatric: He has a normal mood and affect. His behavior is normal.  Nursing note and vitals reviewed.        ED Course  Procedures (including critical care time) DIAGNOSTIC STUDIES: Oxygen Saturation is 100% on RA, normal by my interpretation.    COORDINATION OF CARE: 5:38 PM-Discussed treatment plan with pt at bedside and pt agreed to plan.     Labs Review Labs Reviewed  RPR    Imaging Review No results found.    EKG Interpretation None      MDM   Final diagnoses:  Rash  Oral mucosal lesion   Ddx includes secondary syhillis (though less likely if pt has received PCN tx previously), apthous ulcers, HSV, herpes zoster, or even karposi sarcoma. Unclear if oral lesions are related to cutaneous eruption. 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 Otherwise will hold off on other tx at this time due to unclear etiology of pt's lesions. Instructed pt to call Dr. [name] ASAP to schedule f/u appointment in clinic. Pt was seen in conjunction with attending MD Rhunette Croft who agrees with plan. Pt may try warm salt water rinses for oral lesions and take tylenol or motrin as needed for pain. ER return precautions given.   I personally performed the services described in this documentation, which was scribed in my presence. The recorded information has been reviewed and is accurate.     Carlene Coria, PA-C 02/17/16 [id number]  Derwood Kaplan, MD 02/17/16 1504

## 2016-02-16 NOTE — ED Notes (Signed)
Patient verbalized understanding of discharge instructions and denies any further needs or questions at this time. VS stable. Patient ambulatory with steady gait.  

## 2016-02-17 LAB — RPR: RPR Ser Ql: NONREACTIVE

## 2016-05-09 ENCOUNTER — Encounter (HOSPITAL_COMMUNITY): Payer: Self-pay | Admitting: Nurse Practitioner

## 2016-05-09 ENCOUNTER — Emergency Department (HOSPITAL_COMMUNITY)
Admission: EM | Admit: 2016-05-09 | Discharge: 2016-05-09 | Disposition: A | Discharge status: Home / Self Care | Payer: MEDICAID | Attending: Emergency Medicine | Admitting: Emergency Medicine

## 2016-05-09 DIAGNOSIS — R0981 Nasal congestion: Secondary | ICD-10-CM | POA: Insufficient documentation

## 2016-05-09 DIAGNOSIS — K6289 Other specified diseases of anus and rectum: Secondary | ICD-10-CM | POA: Insufficient documentation

## 2016-05-09 DIAGNOSIS — F17210 Nicotine dependence, cigarettes, uncomplicated: Secondary | ICD-10-CM | POA: Insufficient documentation

## 2016-05-09 DIAGNOSIS — R05 Cough: Secondary | ICD-10-CM | POA: Insufficient documentation

## 2016-05-09 NOTE — ED Notes (Signed)
Patient left without being seen.

## 2016-05-09 NOTE — ED Notes (Addendum)
He has multiple complaints. Pt c/o 3 week history of nasal congestion and drainage, cough. He c/o 2 month history of intermittent "stabbing" rectal pain. He was seen here for the rectal pain and referred to a specialist but he did not follow up yet. He also c/o gum pain. He also c/o open, itchy sores to his back. He is alert and breathing easily

## 2016-05-10 ENCOUNTER — Encounter (HOSPITAL_COMMUNITY): Payer: Self-pay

## 2016-05-10 ENCOUNTER — Emergency Department (HOSPITAL_COMMUNITY)
Admission: EM | Admit: 2016-05-10 | Discharge: 2016-05-10 | Disposition: A | Discharge status: Home / Self Care | Payer: MEDICAID | Attending: Emergency Medicine | Admitting: Emergency Medicine

## 2016-05-10 DIAGNOSIS — K6289 Other specified diseases of anus and rectum: Secondary | ICD-10-CM

## 2016-05-10 DIAGNOSIS — Z792 Long term (current) use of antibiotics: Secondary | ICD-10-CM | POA: Insufficient documentation

## 2016-05-10 DIAGNOSIS — Z21 Asymptomatic human immunodeficiency virus [HIV] infection status: Secondary | ICD-10-CM | POA: Insufficient documentation

## 2016-05-10 DIAGNOSIS — Z79899 Other long term (current) drug therapy: Secondary | ICD-10-CM | POA: Insufficient documentation

## 2016-05-10 DIAGNOSIS — Z791 Long term (current) use of non-steroidal anti-inflammatories (NSAID): Secondary | ICD-10-CM | POA: Insufficient documentation

## 2016-05-10 DIAGNOSIS — J0110 Acute frontal sinusitis, unspecified: Secondary | ICD-10-CM

## 2016-05-10 DIAGNOSIS — F319 Bipolar disorder, unspecified: Secondary | ICD-10-CM | POA: Insufficient documentation

## 2016-05-10 DIAGNOSIS — F17210 Nicotine dependence, cigarettes, uncomplicated: Secondary | ICD-10-CM | POA: Insufficient documentation

## 2016-05-10 LAB — I-STAT CHEM 8, ED
BUN: 7 mg/dL (ref 6–20)
CHLORIDE: 102 mmol/L (ref 101–111)
Calcium, Ion: 1.16 mmol/L (ref 1.12–1.23)
Creatinine, Ser: 0.7 mg/dL (ref 0.61–1.24)
GLUCOSE: 77 mg/dL (ref 65–99)
HEMATOCRIT: 43 % (ref 39.0–52.0)
HEMOGLOBIN: 14.6 g/dL (ref 13.0–17.0)
POTASSIUM: 3.6 mmol/L (ref 3.5–5.1)
SODIUM: 142 mmol/L (ref 135–145)
TCO2: 26 mmol/L (ref 0–100)

## 2016-05-10 LAB — POC OCCULT BLOOD, ED: Fecal Occult Bld: POSITIVE — AB

## 2016-05-10 MED ORDER — AMOXICILLIN-POT CLAVULANATE 875-125 MG PO TABS: 1.0000 | ORAL_TABLET | Freq: Two times a day (BID) | ORAL | Status: DC

## 2016-05-10 MED ORDER — NYSTATIN-TRIAMCINOLONE 100000-0.1 UNIT/GM-% EX CREA: 1.0000 "application " | TOPICAL_CREAM | Freq: Two times a day (BID) | CUTANEOUS | Status: DC

## 2016-05-10 NOTE — ED Provider Notes (Signed)
CSN: [id number]     Arrival date & time 05/10/16  1645 History   First MD Initiated Contact with Patient 05/10/16 1808     Chief Complaint  Patient presents with  . Facial Pain  . Rectal Bleeding  . Rash     (Consider location/radiation/quality/duration/timing/severity/associated sxs/prior Treatment) The history is provided by the patient.   Justin Ball is a 25 y.o. male with past medical history outlined below, most significant for currently untreated HIV (cd4) count unknown (was 90 one year ago) and history of syphilis, but with recent rpr negative (Feb 2017) presenting with 2 complaints, suspected sinusitis and also with complaint of perirectal irritation and itching along with occasional brb with bms.  He reports having forehead pain along with thick green/brown nasal discharge, nasal congestion along with bilateral ear pain and pressure.  He denies any fevers or chills, no sore throat, cough, sob or chest pain.  He also denies abdominal pain, nausea, vomiting, diarrhea or complaints of constipation.  He denies any sexual contact, specifically no anal intercourse in over 6 months. He has taken no medications and has found no alleviators for his symptoms.     Past Medical History  Diagnosis Date  . HIV infection (HCC)   . Syphilis   . Scabies   . Bipolar 1 disorder (HCC)   . Neuromuscular disorder (HCC)   . Substance abuse   . Depression   . Anxiety    Past Surgical History  Procedure Laterality Date  . Incision and drainage perirectal abscess  08/27/2010    Dr Carolynne Edouard  . Incision and drainage perirectal abscess N/A 12/28/2014    Procedure: IRRIGATION AND DEBRIDEMENT PERIRECTAL ABSCESS;  Surgeon: Karie Soda, MD;  Location: [facility];  Service: General;  Laterality: N/A;  Fistula repair and ablation  . Examination under anesthesia N/A 12/28/2014    Procedure: EXAM UNDER ANESTHESIA;  Surgeon: Karie Soda, MD;  Location: [facility];  Service: General;  Laterality: N/A;   Family History   Problem Relation Age of Onset  . Hypertension Other    Social History  Substance Use Topics  . Smoking status: Current Every Day Smoker -- 0.25 packs/day    Types: Cigarettes  . Smokeless tobacco: Never Used  . Alcohol Use: 0.0 oz/week    Review of Systems  Constitutional: Negative for fever and chills.  HENT: Positive for congestion, ear pain, rhinorrhea and sinus pressure. Negative for facial swelling, hearing loss and sore throat.   Eyes: Negative.   Respiratory: Negative for chest tightness and shortness of breath.   Cardiovascular: Negative for chest pain.  Gastrointestinal: Positive for blood in stool and rectal pain. Negative for nausea, abdominal pain, diarrhea and constipation.  Genitourinary: Negative.   Musculoskeletal: Negative for joint swelling, arthralgias and neck pain.  Skin: Negative.  Negative for rash and wound.  Neurological: Negative for dizziness, weakness, light-headedness, numbness and headaches.  Psychiatric/Behavioral: Negative.       Allergies  Review of patient's allergies indicates no known allergies.  Home Medications   Prior to Admission medications   Medication Sig Start Date End Date Taking? Authorizing Provider  ibuprofen (ADVIL,MOTRIN) 200 MG tablet Take 600 mg by mouth every 6 (six) hours as needed for moderate pain.   Yes Historical Provider, MD  amoxicillin-clavulanate (AUGMENTIN) 875-125 MG tablet Take 1 tablet by mouth every 12 (twelve) hours. 05/10/16   Burgess Amor, PA-C  azithromycin (ZITHROMAX) 600 MG tablet Take 2 tablets (1,200 mg total) by mouth once a week. Patient  not taking: Reported on 10/17/2015 01/19/15   Randall Hissornelius N Van Dam, MD  dolutegravir (TIVICAY) 50 MG tablet Take 1 tablet (50 mg total) by mouth daily. 01/26/15   Cliffton AstersJohn Campbell, MD  emtricitabine-tenofovir (TRUVADA) 200-300 MG per tablet Take 1 tablet by mouth daily. 01/26/15   Cliffton AstersJohn Campbell, MD  ibuprofen (ADVIL,MOTRIN) 800 MG tablet Take 1 tablet (800 mg total) by mouth 3  (three) times daily. Patient not taking: Reported on 05/10/2016 09/29/15   Garlon HatchetLisa M Sanders, PA-C  nystatin-triamcinolone [facility](MYCOLOG II) cream Apply 1 application topically 2 (two) times daily. Apply to perirectal rash 05/10/16   Burgess AmorJulie Julius Boniface, PA-C   BP 130/95 mmHg  Pulse 94  Temp(Src) 98.2 F (36.8 C) (Oral)  Resp 18  SpO2 100% Physical Exam  Constitutional: He appears well-developed and well-nourished.  HENT:  Head: Normocephalic and atraumatic.  Right Ear: Tympanic membrane and ear canal normal.  Left Ear: Tympanic membrane and ear canal normal.  Nose: Mucosal edema present. No rhinorrhea. Left sinus exhibits frontal sinus tenderness.  Mouth/Throat: Uvula is midline.  Anterior lower gingiva edematous, hyperemic.   Eyes: Conjunctivae are normal.  Neck: Normal range of motion and full passive range of motion without pain.  Cardiovascular: Normal rate, regular rhythm, normal heart sounds and intact distal pulses.   Pulmonary/Chest: Effort normal and breath sounds normal. He has no wheezes.  Abdominal: Soft. Bowel sounds are normal. There is no tenderness.  Genitourinary: Rectal exam shows tenderness. Rectal exam shows no external hemorrhoid and no fissure.  Perirectal rash/macerated, excoriated skin.  Hemoccult obtained, pt is not grossly positive, but hemoccult positive. No mass, no abscess.  Chaperone was present during exam.   Musculoskeletal: Normal range of motion.  Neurological: He is alert.  Skin: Skin is warm and dry.  Chronic appearing rash trunk, extremities. Some raised papules, other ulcerated dry lesions, multiple areas of healed hyperpigmented lesions.   Psychiatric: He has a normal mood and affect.  Nursing note and vitals reviewed.   ED Course  Procedures (including critical care time) Labs Review Labs Reviewed  POC OCCULT BLOOD, ED - Abnormal; Notable for the following:    Fecal Occult Bld POSITIVE (*)    All other components within normal limits  I-STAT CHEM 8,  ED    Imaging Review No results found. I have personally reviewed and evaluated these images and lab results as part of my medical decision-making.   EKG Interpretation None      MDM   Final diagnoses:  Acute frontal sinusitis, recurrence not specified  Perirectal inflammation    Pt will be covered with augmentin for suspected sinusitis. Perirectal sx pruritic, suspicious for fungal skin infection.  He was prescribed topical mycolog II cream for topical use.  Disseminated rash essentially same exam findings as photo documented from visit in February. RPR obtained at that time non reactive.  Rash is itchy, nonpainful. Doubt hsv as source. Chronic, unable to rule out Kaposi sarcoma. Pt was strongly urged to re-establish care with Dr. Daiva EvesVan Dam, last saw him last fall.  He does plan to call in the am for an appt with him.  Pt has been afebrile, is in no acute distress at this time.  The patient appears reasonably screened and/or stabilized for discharge and I doubt any other medical condition or other [facility] requiring further screening, evaluation, or treatment in the ED at this time prior to discharge.      Burgess AmorJulie Aurelius Gildersleeve, PA-C 05/11/16 1427  Benjiman CoreNathan Pickering, MD 05/13/16 [phone]

## 2016-05-10 NOTE — Discharge Instructions (Signed)

## 2016-05-10 NOTE — ED Notes (Addendum)
He c/o cough plus sinus area pressure with clear nasal drainage.  He also c/o minimal "blood in my stools sometimes".  He further states "in the past [facility] has given me antibiotics for this and my rectal bleeding cleared up".  He is in no distress.  He also has a widely disseminated rash in various stages of healing which is very like impetigo in appearance.

## 2016-08-22 ENCOUNTER — Encounter (HOSPITAL_COMMUNITY): Payer: Self-pay

## 2016-08-22 ENCOUNTER — Emergency Department (HOSPITAL_COMMUNITY)
Admission: EM | Admit: 2016-08-22 | Discharge: 2016-08-22 | Disposition: A | Discharge status: Home / Self Care | Payer: Self-pay | Attending: Emergency Medicine | Admitting: Emergency Medicine

## 2016-08-22 ENCOUNTER — Emergency Department (HOSPITAL_COMMUNITY): Payer: Self-pay

## 2016-08-22 DIAGNOSIS — F17210 Nicotine dependence, cigarettes, uncomplicated: Secondary | ICD-10-CM | POA: Insufficient documentation

## 2016-08-22 DIAGNOSIS — K6289 Other specified diseases of anus and rectum: Secondary | ICD-10-CM | POA: Insufficient documentation

## 2016-08-22 DIAGNOSIS — Z79899 Other long term (current) drug therapy: Secondary | ICD-10-CM | POA: Insufficient documentation

## 2016-08-22 LAB — CBC WITH DIFFERENTIAL/PLATELET
BASOS ABS: 0 10*3/uL (ref 0.0–0.1)
BASOS PCT: 1 %
EOS ABS: 0.1 10*3/uL (ref 0.0–0.7)
EOS PCT: 3 %
HCT: 39.8 % (ref 39.0–52.0)
HEMOGLOBIN: 13.4 g/dL (ref 13.0–17.0)
Lymphocytes Relative: 34 %
Lymphs Abs: 1 10*3/uL (ref 0.7–4.0)
MCH: 31.1 pg (ref 26.0–34.0)
MCHC: 33.7 g/dL (ref 30.0–36.0)
MCV: 92.3 fL (ref 78.0–100.0)
Monocytes Absolute: 0.4 10*3/uL (ref 0.1–1.0)
Monocytes Relative: 13 %
NEUTROS PCT: 49 %
Neutro Abs: 1.4 10*3/uL — ABNORMAL LOW (ref 1.7–7.7)
PLATELETS: 193 10*3/uL (ref 150–400)
RBC: 4.31 MIL/uL (ref 4.22–5.81)
RDW: 13.8 % (ref 11.5–15.5)
WBC: 2.8 10*3/uL — AB (ref 4.0–10.5)

## 2016-08-22 LAB — I-STAT CHEM 8, ED
BUN: 15 mg/dL (ref 6–20)
CHLORIDE: 104 mmol/L (ref 101–111)
Calcium, Ion: 1.14 mmol/L — ABNORMAL LOW (ref 1.15–1.40)
Creatinine, Ser: 0.8 mg/dL (ref 0.61–1.24)
Glucose, Bld: 76 mg/dL (ref 65–99)
HEMATOCRIT: 42 % (ref 39.0–52.0)
HEMOGLOBIN: 14.3 g/dL (ref 13.0–17.0)
Potassium: 3.6 mmol/L (ref 3.5–5.1)
SODIUM: 142 mmol/L (ref 135–145)
TCO2: 27 mmol/L (ref 0–100)

## 2016-08-22 MED ORDER — CLINDAMYCIN HCL 150 MG PO CAPS: 300.0000 mg | ORAL_CAPSULE | Freq: Three times a day (TID) | ORAL | 0 refills | Status: DC

## 2016-08-22 MED ORDER — IOPAMIDOL (ISOVUE-300) INJECTION 61%
INTRAVENOUS | Status: AC
  Administered 2016-08-22: 75 mL
  Filled 2016-08-22: qty 100

## 2016-08-22 MED ORDER — NYSTATIN-TRIAMCINOLONE 100000-0.1 UNIT/GM-% EX OINT: 1.0000 "application " | TOPICAL_OINTMENT | Freq: Two times a day (BID) | CUTANEOUS | 0 refills | Status: DC

## 2016-08-22 NOTE — Discharge Instructions (Signed)
Sitz baths several times a day. Take ibuprofen or tylenol for pain. Apply nystatin cream as prescribed to the rectum. Take clindamycin until all gone. Follow up with your infectious disease doctor. Return if worsening symptoms.

## 2016-08-22 NOTE — ED Notes (Signed)
Patient taken to CT.

## 2016-08-22 NOTE — ED Provider Notes (Signed)
[facility]-EMERGENCY DEPT Provider Note   CSN: [id number] Arrival date & time: 08/22/16  0229     History   Chief Complaint Chief Complaint  Patient presents with  . Rectal Pain    HPI Justin Ball is a 25 y.o. male.  HPI Justin Ball is a 25 y.o. male with history of HIV/AIDS infection, polysubstance abuse, syphilis, bipolar disorder, noncompliant with his HIV meds, presents to emergency department complaining of severe rectal pain and drainage. Patient reports similar symptoms in the past. States that he has been seen in emergency department several times for the same, sometimes treated with cream, sometimes with antibiotics. One time he had a perirectal abscess that had to be drained in the operating room. He states this time rectal pain started several days ago. He states he has severe pain with bowel movements. He reports some bleeding on the tissue and reports brown drainage in his underwear. He denies any rectal intercourse in the last several weeks. He denies any fever or chills. He denies abdominal pain. He has not tried any treatment other than he restarted Augmentin that he had left over from his last antibiotic treatment.  Past Medical History:  Diagnosis Date  . Anxiety   . Bipolar 1 disorder (HCC)   . Depression   . HIV infection (HCC)   . Neuromuscular disorder (HCC)   . Scabies   . Substance abuse   . Syphilis     Patient Active Problem List   Diagnosis Date Noted  . AIDS (HCC) 01/19/2015  . 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 H/O noncompliance with medical treatment - finincial, presenting hazards to health 12/28/2014  . Rectal abscess 12/28/2014  . Intersphincteric abscess 12/28/2014  . Bipolar disorder (HCC) 04/08/2013  . Muscle ache 01/06/2013  . Lower back pain 01/06/2013  . Insomnia 01/06/2013  . HIV (human immunodeficiency virus infection) (HCC) 10/10/2012  . Rash and nonspecific skin eruption 10/10/2012  . Rectal discharge  10/10/2012  . Syphilis 10/10/2012    Past Surgical History:  Procedure Laterality Date  . EXAMINATION UNDER ANESTHESIA N/A 12/28/2014   Procedure: EXAM UNDER ANESTHESIA;  Surgeon: Karie Soda, MD;  Location: [facility];  Service: General;  Laterality: N/A;  . INCISION AND DRAINAGE PERIRECTAL ABSCESS  08/27/2010   Dr Carolynne Edouard  . INCISION AND DRAINAGE PERIRECTAL ABSCESS N/A 12/28/2014   Procedure: IRRIGATION AND DEBRIDEMENT PERIRECTAL ABSCESS;  Surgeon: Karie Soda, MD;  Location: [facility];  Service: General;  Laterality: N/A;  Fistula repair and ablation       Home Medications    Prior to Admission medications   Medication Sig Start Date End Date Taking? Authorizing Provider  amoxicillin-clavulanate (AUGMENTIN) 875-125 MG tablet Take 1 tablet by mouth every 12 (twelve) hours. 05/10/16   Burgess Amor, PA-C  azithromycin (ZITHROMAX) 600 MG tablet Take 2 tablets (1,200 mg total) by mouth once a week. Patient not taking: Reported on 10/17/2015 01/19/15   Randall Hiss, MD  dolutegravir (TIVICAY) 50 MG tablet Take 1 tablet (50 mg total) by mouth daily. 01/26/15   Cliffton Asters, MD  emtricitabine-tenofovir (TRUVADA) 200-300 MG per tablet Take 1 tablet by mouth daily. 01/26/15   Cliffton Asters, MD  ibuprofen (ADVIL,MOTRIN) 200 MG tablet Take 600 mg by mouth every 6 (six) hours as needed for moderate pain.    Historical Provider, MD  ibuprofen (ADVIL,MOTRIN) 800 MG tablet Take 1 tablet (800 mg total) by mouth 3 (three) times daily. Patient not taking: Reported on  05/10/2016 09/29/15   Garlon HatchetLisa M Sanders, PA-C  nystatin-triamcinolone (MYCOLOG II) cream Apply 1 application topically 2 (two) times daily. Apply to perirectal rash 05/10/16   Burgess AmorJulie Idol, PA-C    Family History Family History  Problem Relation Age of Onset  . Hypertension Other     Social History Social History  Substance Use Topics  . Smoking status: Current Every Day Smoker    Packs/day: 0.25    Types: Cigarettes  . Smokeless tobacco: Never  Used  . Alcohol use 0.0 oz/week     Allergies   Review of patient's allergies indicates no known allergies.   Review of Systems Review of Systems  Constitutional: Negative for chills and fever.  Respiratory: Negative for cough, chest tightness and shortness of breath.   Cardiovascular: Negative for chest pain, palpitations and leg swelling.  Gastrointestinal: Positive for anal bleeding, blood in stool and rectal pain. Negative for abdominal distention, abdominal pain, diarrhea, nausea and vomiting.  Genitourinary: Negative for dysuria, frequency, hematuria and urgency.  Musculoskeletal: Negative for arthralgias, myalgias, neck pain and neck stiffness.  Skin: Negative for rash.  Allergic/Immunologic: Negative for immunocompromised state.  Neurological: Negative for dizziness, weakness, light-headedness, numbness and headaches.  All other systems reviewed and are negative.    Physical Exam Updated Vital Signs BP 131/85   Pulse 77   Temp 98.4 F (36.9 C) (Oral)   Resp 17   SpO2 99%   Physical Exam  Constitutional: He appears well-developed and well-nourished. No distress.  HENT:  Head: Normocephalic and atraumatic.  Eyes: Conjunctivae are normal.  Neck: Neck supple.  Cardiovascular: Normal rate, regular rhythm and normal heart sounds.   Pulmonary/Chest: Effort normal. No respiratory distress. He has no wheezes. He has no rales.  Abdominal: Soft. Bowel sounds are normal. He exhibits no distension. There is no tenderness. There is no rebound.  Genitourinary:  Genitourinary Comments: Erythema, macerated skin around the rectum with brown/stool discharge. Severe tenderness to palpation around the rectum.  Musculoskeletal: He exhibits no edema.  Neurological: He is alert.  Skin: Skin is warm and dry.  Nursing note and vitals reviewed.    ED Treatments / Results  Labs (all labs ordered are listed, but only abnormal results are displayed) Labs Reviewed  CBC WITH  DIFFERENTIAL/PLATELET - Abnormal; Notable for the following:       Result Value   WBC 2.8 (*)    Neutro Abs 1.4 (*)    All other components within normal limits  I-STAT CHEM 8, ED - Abnormal; Notable for the following:    Calcium, Ion 1.14 (*)    All other components within normal limits    EKG  EKG Interpretation None       Radiology Ct Pelvis W Contrast  Result Date: 08/22/2016 CLINICAL DATA:  Rectal pain.  History of perirectal abscess. EXAM: CT PELVIS WITH CONTRAST TECHNIQUE: Multidetector CT imaging of the pelvis was performed using the standard protocol following the bolus administration of intravenous contrast. CONTRAST:  75mL ISOVUE-300 IOPAMIDOL (ISOVUE-300) INJECTION 61% COMPARISON:  08/27/2010 FINDINGS: No CT evidence of a perirectal abscess. No acute inflammatory changes. Visible portions of lower abdominal bowel and viscera are unremarkable. No ascites. No adenopathy. IMPRESSION: Negative for abscess or drainable collection. Electronically Signed   By: Ellery Plunkaniel R Mitchell M.D.   On: 08/22/2016 06:23    Procedures Procedures (including critical care time)  Medications Ordered in ED Medications - No data to display   Initial Impression / Assessment and Plan / ED Course  I have reviewed the triage vital signs and the nursing notes.  Pertinent labs & imaging results that were available during my care of the patient were reviewed by me and considered in my medical decision making (see chart for details).  Clinical Course   4:13 AM Patient seen and examined. Patient emerged department with rectal pain and discharge from the rectum. On exam, patient has some tenderness, erythema, maceration of the skin around the rectum. Unable to perform a digital exam due to severe pain. Patient has history of perirectal abscess, will get CT for further evaluation.   6:55 AM CT is negative for an abscess. Question fungal versus bacterial infection around the rectum. Patient's last CD4  count was 90 just few month ago. He is currently not taking antivirals because he ran out. Discussed with Dr. Blinda Leatherwood, will cover with antifungal topical cream and oral antibiotics. Follow up with his infectious disease specialist. Patient voiced understanding and agrees to the plan.  Vitals:   08/22/16 0430 08/22/16 0500 08/22/16 0530 08/22/16 0655  BP: 125/83 120/76 123/84 122/80  Pulse: 69 78 70 72  Resp:    16  Temp:      TempSrc:      SpO2: 100% 100% 100% 100%     Final Clinical Impressions(s) / ED Diagnoses   Final diagnoses:  Rectal pain    New Prescriptions New Prescriptions   CLINDAMYCIN (CLEOCIN) 150 MG CAPSULE    Take 2 capsules (300 mg total) by mouth 3 (three) times daily.   NYSTATIN-TRIAMCINOLONE OINTMENT (MYCOLOG)    Apply 1 application topically 2 (two) times daily.     Jaynie Crumble, PA-C 08/22/16 1610    Gilda Crease, MD 08/22/16 [phone]

## 2016-08-22 NOTE — ED Triage Notes (Signed)
Pt complaining of rectal pain. Pt states soreness and pain with ambulation. Pt states less painful with bowel movements. Pt states some bleeding.

## 2016-09-11 ENCOUNTER — Encounter (HOSPITAL_COMMUNITY): Payer: Self-pay | Admitting: *Deleted

## 2016-09-11 DIAGNOSIS — B20 Human immunodeficiency virus [HIV] disease: Principal | ICD-10-CM | POA: Diagnosis present

## 2016-09-11 DIAGNOSIS — Z23 Encounter for immunization: Secondary | ICD-10-CM

## 2016-09-11 DIAGNOSIS — F1010 Alcohol abuse, uncomplicated: Secondary | ICD-10-CM | POA: Diagnosis present

## 2016-09-11 DIAGNOSIS — K122 Cellulitis and abscess of mouth: Secondary | ICD-10-CM | POA: Diagnosis present

## 2016-09-11 DIAGNOSIS — F17210 Nicotine dependence, cigarettes, uncomplicated: Secondary | ICD-10-CM | POA: Diagnosis present

## 2016-09-11 DIAGNOSIS — F319 Bipolar disorder, unspecified: Secondary | ICD-10-CM | POA: Diagnosis present

## 2016-09-11 DIAGNOSIS — E876 Hypokalemia: Secondary | ICD-10-CM | POA: Diagnosis present

## 2016-09-11 DIAGNOSIS — B3789 Other sites of candidiasis: Secondary | ICD-10-CM | POA: Diagnosis present

## 2016-09-11 DIAGNOSIS — B370 Candidal stomatitis: Secondary | ICD-10-CM | POA: Diagnosis present

## 2016-09-11 DIAGNOSIS — T375X6A Underdosing of antiviral drugs, initial encounter: Secondary | ICD-10-CM | POA: Diagnosis present

## 2016-09-11 DIAGNOSIS — K121 Other forms of stomatitis: Secondary | ICD-10-CM | POA: Diagnosis present

## 2016-09-11 DIAGNOSIS — Z79899 Other long term (current) drug therapy: Secondary | ICD-10-CM

## 2016-09-11 DIAGNOSIS — D649 Anemia, unspecified: Secondary | ICD-10-CM | POA: Diagnosis present

## 2016-09-11 DIAGNOSIS — Z9119 Patient's noncompliance with other medical treatment and regimen: Secondary | ICD-10-CM

## 2016-09-11 DIAGNOSIS — Z9114 Patient's other noncompliance with medication regimen: Secondary | ICD-10-CM

## 2016-09-11 DIAGNOSIS — D013 Carcinoma in situ of anus and anal canal: Secondary | ICD-10-CM | POA: Diagnosis present

## 2016-09-11 LAB — RAPID STREP SCREEN (MED CTR MEBANE ONLY): STREPTOCOCCUS, GROUP A SCREEN (DIRECT): NEGATIVE

## 2016-09-11 NOTE — ED Triage Notes (Signed)
Pt states two weeks of throat pain/mouth pain. No fevers

## 2016-09-12 ENCOUNTER — Inpatient Hospital Stay (HOSPITAL_COMMUNITY)
Admission: EM | Admit: 2016-09-12 | Discharge: 2016-09-21 | DRG: 975 | Disposition: A | Discharge status: Home / Self Care | Payer: Self-pay | Attending: Internal Medicine | Admitting: Internal Medicine

## 2016-09-12 ENCOUNTER — Encounter (HOSPITAL_COMMUNITY): Payer: Self-pay | Admitting: General Practice

## 2016-09-12 ENCOUNTER — Emergency Department (HOSPITAL_COMMUNITY): Payer: Self-pay

## 2016-09-12 DIAGNOSIS — K1379 Other lesions of oral mucosa: Secondary | ICD-10-CM

## 2016-09-12 DIAGNOSIS — K122 Cellulitis and abscess of mouth: Secondary | ICD-10-CM | POA: Diagnosis present

## 2016-09-12 DIAGNOSIS — F1090 Alcohol use, unspecified, uncomplicated: Secondary | ICD-10-CM | POA: Diagnosis present

## 2016-09-12 DIAGNOSIS — F319 Bipolar disorder, unspecified: Secondary | ICD-10-CM | POA: Diagnosis present

## 2016-09-12 DIAGNOSIS — E876 Hypokalemia: Secondary | ICD-10-CM | POA: Diagnosis present

## 2016-09-12 DIAGNOSIS — K121 Other forms of stomatitis: Secondary | ICD-10-CM | POA: Diagnosis present

## 2016-09-12 DIAGNOSIS — D013 Carcinoma in situ of anus and anal canal: Secondary | ICD-10-CM

## 2016-09-12 DIAGNOSIS — Z91199 Patient's noncompliance with other medical treatment and regimen due to unspecified reason: Secondary | ICD-10-CM

## 2016-09-12 DIAGNOSIS — B20 Human immunodeficiency virus [HIV] disease: Secondary | ICD-10-CM | POA: Diagnosis present

## 2016-09-12 DIAGNOSIS — F17210 Nicotine dependence, cigarettes, uncomplicated: Secondary | ICD-10-CM

## 2016-09-12 DIAGNOSIS — L0291 Cutaneous abscess, unspecified: Secondary | ICD-10-CM | POA: Insufficient documentation

## 2016-09-12 DIAGNOSIS — Z7289 Other problems related to lifestyle: Secondary | ICD-10-CM | POA: Diagnosis present

## 2016-09-12 DIAGNOSIS — Z789 Other specified health status: Secondary | ICD-10-CM

## 2016-09-12 DIAGNOSIS — D649 Anemia, unspecified: Secondary | ICD-10-CM | POA: Diagnosis present

## 2016-09-12 DIAGNOSIS — F3170 Bipolar disorder, currently in remission, most recent episode unspecified: Secondary | ICD-10-CM

## 2016-09-12 DIAGNOSIS — Z9119 Patient's noncompliance with other medical treatment and regimen: Secondary | ICD-10-CM

## 2016-09-12 LAB — CBC WITH DIFFERENTIAL/PLATELET
BASOS PCT: 0 %
Basophils Absolute: 0 10*3/uL (ref 0.0–0.1)
EOS PCT: 2 %
Eosinophils Absolute: 0.1 10*3/uL (ref 0.0–0.7)
HEMATOCRIT: 35.7 % — AB (ref 39.0–52.0)
HEMOGLOBIN: 12 g/dL — AB (ref 13.0–17.0)
Lymphocytes Relative: 11 %
Lymphs Abs: 0.5 10*3/uL — ABNORMAL LOW (ref 0.7–4.0)
MCH: 31.2 pg (ref 26.0–34.0)
MCHC: 33.6 g/dL (ref 30.0–36.0)
MCV: 92.7 fL (ref 78.0–100.0)
MONO ABS: 0.4 10*3/uL (ref 0.1–1.0)
Monocytes Relative: 9 %
NEUTROS PCT: 78 %
Neutro Abs: 3.9 10*3/uL (ref 1.7–7.7)
PLATELETS: 261 10*3/uL (ref 150–400)
RBC: 3.85 MIL/uL — ABNORMAL LOW (ref 4.22–5.81)
RDW: 14.4 % (ref 11.5–15.5)
WBC: 4.9 10*3/uL (ref 4.0–10.5)

## 2016-09-12 LAB — RETICULOCYTES
RBC.: 3.57 MIL/uL — AB (ref 4.22–5.81)
RETIC CT PCT: 0.5 % (ref 0.4–3.1)
Retic Count, Absolute: 17.9 10*3/uL — ABNORMAL LOW (ref 19.0–186.0)

## 2016-09-12 LAB — MAGNESIUM: Magnesium: 2 mg/dL (ref 1.7–2.4)

## 2016-09-12 LAB — BASIC METABOLIC PANEL
ANION GAP: 8 (ref 5–15)
BUN: 13 mg/dL (ref 6–20)
CALCIUM: 8.7 mg/dL — AB (ref 8.9–10.3)
CO2: 24 mmol/L (ref 22–32)
Chloride: 107 mmol/L (ref 101–111)
Creatinine, Ser: 0.92 mg/dL (ref 0.61–1.24)
GLUCOSE: 78 mg/dL (ref 65–99)
POTASSIUM: 3.4 mmol/L — AB (ref 3.5–5.1)
SODIUM: 139 mmol/L (ref 135–145)

## 2016-09-12 LAB — IRON AND TIBC
Iron: 32 ug/dL — ABNORMAL LOW (ref 45–182)
SATURATION RATIOS: 15 % — AB (ref 17.9–39.5)
TIBC: 211 ug/dL — ABNORMAL LOW (ref 250–450)
UIBC: 179 ug/dL

## 2016-09-12 LAB — PHOSPHORUS: PHOSPHORUS: 2.7 mg/dL (ref 2.5–4.6)

## 2016-09-12 LAB — VITAMIN B12: VITAMIN B 12: 1277 pg/mL — AB (ref 180–914)

## 2016-09-12 LAB — FERRITIN: FERRITIN: 1011 ng/mL — AB (ref 24–336)

## 2016-09-12 LAB — FOLATE: Folate: 5.6 ng/mL — ABNORMAL LOW (ref >5.9)

## 2016-09-12 MED ORDER — ENSURE ENLIVE PO LIQD
237.0000 mL | Freq: Two times a day (BID) | ORAL | Status: DC
  Administered 2016-09-12 – 2016-09-20 (×9): 237 mL via ORAL
  Filled 2016-09-12: qty 237

## 2016-09-12 MED ORDER — ADULT MULTIVITAMIN W/MINERALS CH
1.0000 | ORAL_TABLET | Freq: Every day | ORAL | Status: DC
  Administered 2016-09-12 – 2016-09-21 (×9): 1 via ORAL
  Filled 2016-09-12 (×10): qty 1

## 2016-09-12 MED ORDER — THIAMINE HCL 100 MG/ML IJ SOLN: 100.0000 mg | Freq: Every day | INTRAMUSCULAR | Status: DC

## 2016-09-12 MED ORDER — DOLUTEGRAVIR SODIUM 50 MG PO TABS
50.0000 mg | ORAL_TABLET | Freq: Every day | ORAL | Status: DC
  Administered 2016-09-12 – 2016-09-21 (×9): 50 mg via ORAL
  Filled 2016-09-12 (×10): qty 1

## 2016-09-12 MED ORDER — LORAZEPAM 2 MG/ML IJ SOLN
0.0000 mg | Freq: Four times a day (QID) | INTRAMUSCULAR | Status: AC
  Administered 2016-09-13 – 2016-09-14 (×2): 2 mg via INTRAVENOUS
  Filled 2016-09-12 (×2): qty 1

## 2016-09-12 MED ORDER — CLINDAMYCIN PHOSPHATE 600 MG/50ML IV SOLN
600.0000 mg | Freq: Three times a day (TID) | INTRAVENOUS | Status: DC
Start: 2016-09-12 — End: 2016-09-12

## 2016-09-12 MED ORDER — EMTRICITABINE-TENOFOVIR AF 200-25 MG PO TABS
1.0000 | ORAL_TABLET | Freq: Every day | ORAL | Status: DC
  Administered 2016-09-12 – 2016-09-21 (×9): 1 via ORAL
  Filled 2016-09-12 (×10): qty 1

## 2016-09-12 MED ORDER — VITAMIN B-1 100 MG PO TABS
100.0000 mg | ORAL_TABLET | Freq: Every day | ORAL | Status: DC
  Administered 2016-09-12 – 2016-09-21 (×9): 100 mg via ORAL
  Filled 2016-09-12 (×10): qty 1

## 2016-09-12 MED ORDER — SODIUM CHLORIDE 0.9 % IV BOLUS (SEPSIS)
1000.0000 mL | Freq: Once | INTRAVENOUS | Status: AC
  Administered 2016-09-12: 1000 mL via INTRAVENOUS

## 2016-09-12 MED ORDER — RITONAVIR 100 MG PO TABS: 100.0000 mg | ORAL_TABLET | Freq: Every day | ORAL | Status: DC

## 2016-09-12 MED ORDER — HYDROMORPHONE HCL 1 MG/ML IJ SOLN
1.0000 mg | INTRAMUSCULAR | Status: DC | PRN
  Administered 2016-09-12 – 2016-09-15 (×16): 1 mg via INTRAVENOUS
  Filled 2016-09-12 (×17): qty 1

## 2016-09-12 MED ORDER — HEPARIN SODIUM (PORCINE) 5000 UNIT/ML IJ SOLN
5000.0000 [IU] | Freq: Three times a day (TID) | INTRAMUSCULAR | Status: DC
  Filled 2016-09-12 (×9): qty 1

## 2016-09-12 MED ORDER — LORAZEPAM 2 MG/ML IJ SOLN
0.0000 mg | Freq: Two times a day (BID) | INTRAMUSCULAR | Status: DC
Start: 2016-09-14 — End: 2016-09-16
  Administered 2016-09-14: 2 mg via INTRAVENOUS
  Filled 2016-09-12 (×2): qty 1

## 2016-09-12 MED ORDER — MORPHINE SULFATE (PF) 2 MG/ML IV SOLN
1.0000 mg | INTRAVENOUS | Status: DC | PRN
  Administered 2016-09-12 (×3): 4 mg via INTRAVENOUS
  Filled 2016-09-12 (×3): qty 2

## 2016-09-12 MED ORDER — CLINDAMYCIN PHOSPHATE 600 MG/50ML IV SOLN
600.0000 mg | Freq: Once | INTRAVENOUS | Status: AC
  Administered 2016-09-12: 600 mg via INTRAVENOUS
  Filled 2016-09-12: qty 50

## 2016-09-12 MED ORDER — HPV QUADRIVALENT VACCINE IM SUSP
0.5000 mL | Freq: Once | INTRAMUSCULAR | Status: AC
  Administered 2016-09-12: 0.5 mL via INTRAMUSCULAR
  Filled 2016-09-12: qty 0.5

## 2016-09-12 MED ORDER — SODIUM CHLORIDE 0.9 % IV SOLN: INTRAVENOUS | Status: DC

## 2016-09-12 MED ORDER — DARUNAVIR ETHANOLATE 800 MG PO TABS: 800.0000 mg | ORAL_TABLET | Freq: Every day | ORAL | Status: DC

## 2016-09-12 MED ORDER — KETOROLAC TROMETHAMINE 15 MG/ML IJ SOLN
15.0000 mg | Freq: Four times a day (QID) | INTRAMUSCULAR | Status: DC
  Administered 2016-09-12 – 2016-09-16 (×18): 15 mg via INTRAVENOUS
  Filled 2016-09-12 (×18): qty 1

## 2016-09-12 MED ORDER — ACETAMINOPHEN 650 MG RE SUPP: 650.0000 mg | Freq: Four times a day (QID) | RECTAL | Status: DC | PRN

## 2016-09-12 MED ORDER — LORAZEPAM 2 MG/ML IJ SOLN: 1.0000 mg | Freq: Four times a day (QID) | INTRAMUSCULAR | Status: DC | PRN

## 2016-09-12 MED ORDER — ACETAMINOPHEN 325 MG PO TABS: 650.0000 mg | ORAL_TABLET | Freq: Four times a day (QID) | ORAL | Status: DC | PRN

## 2016-09-12 MED ORDER — POTASSIUM CHLORIDE IN NACL 20-0.9 MEQ/L-% IV SOLN
INTRAVENOUS | Status: DC
  Administered 2016-09-12 (×2): via INTRAVENOUS
  Administered 2016-09-12: 1000 mL via INTRAVENOUS
  Administered 2016-09-13: 06:00:00 via INTRAVENOUS
  Administered 2016-09-13: 1000 mL via INTRAVENOUS
  Administered 2016-09-14: 08:00:00 via INTRAVENOUS
  Filled 2016-09-12 (×8): qty 1000

## 2016-09-12 MED ORDER — LORAZEPAM 1 MG PO TABS
1.0000 mg | ORAL_TABLET | Freq: Four times a day (QID) | ORAL | Status: DC | PRN
Start: 2016-09-12 — End: 2016-09-15

## 2016-09-12 MED ORDER — IOPAMIDOL (ISOVUE-300) INJECTION 61%
INTRAVENOUS | Status: AC
  Filled 2016-09-12: qty 75

## 2016-09-12 MED ORDER — DARUNAVIR-COBICISTAT 800-150 MG PO TABS
1.0000 | ORAL_TABLET | Freq: Every day | ORAL | Status: DC
  Administered 2016-09-12 – 2016-09-18 (×5): 1 via ORAL
  Filled 2016-09-12 (×8): qty 1

## 2016-09-12 MED ORDER — FOLIC ACID 1 MG PO TABS
1.0000 mg | ORAL_TABLET | Freq: Every day | ORAL | Status: DC
  Administered 2016-09-12 – 2016-09-21 (×9): 1 mg via ORAL
  Filled 2016-09-12 (×10): qty 1

## 2016-09-12 NOTE — H&P (Signed)
History and Physical    Justin Ball WUJ:[id number] DOB: 1991-09-28 DOA: 09/12/2016   PCP: No primary care provider on file. /UNASSIGNED  Patient coming from/Resides with: Private residence  Admission status: Outpatient/floor   Chief Complaint: Sore throat with drainage  HPI: Justin Ball is a 25 y.o. male with medical history significant for HIV/AIDS noncompliant with antiretroviral therapies in the past 6 months, bipolar disorder, history of prior anal intraepithelial neoplasia III with apparent recurrent issues with rectal abscesses and rectal discharge and was seen in the ER on 8/29 for similar symptoms-CT at that time did not reveal any abscess and patient was started on oral clindamycin and antifungal topical cream. Patient has recurrent issues with medical noncompliance.  He reports for the past 2 weeks he said a sore throat with sinus pressure and bilateral eye drainage. Over the past week he's been expectorating green and white mucus secretions with coughing and had noticed some patches/ulcers all through his mouth including on the upper palate. There was one area on the left side of the palate which was larger and the most painful and over time this area has opened up and has drained similar secretions. Because of the pain he's been unable to eat or drink adequately. As noted above admitted to noncompliance with ARTs for at least 6 months and states he cannot remember the last time he went to the ID clinic. He is employed and states he is working all the time and this is why he cannot go to the doctor.  ED Course:  Vital Signs: BP 122/81 (BP Location: Left Arm)   Pulse 86   Temp 98.3 F (36.8 C) (Oral)   Resp 14   Ht 6\' 1"  (1.854 m)   Wt 67 kg (147 lb 12.8 oz)   SpO2 99%   BMI 19.50 kg/m  CT soft tissue neck with contrast: Circumscribed ovoid hypodense lesion measuring 1.9 x 1.5 x 1.5 cm present at the right floor of the mouth without significant enhancement or associated  inflammatory change; probable ulcerative lesions noted at the juncture of the hard and soft palate compatible with patient's reported history. These extend immediately posterior to the hard palate with extension into the left posterior nasal cavity. There is no associated abscess  Lab data: Sodium 139, potassium 3.4, CO2 24, BUN 13, creatinine 0.92, white count 4900 neutrophils 78%, absolute fails 3.9% with low lymphocytes 0.5%, hemoglobin 12 and platelets 261,000, rapid streptococcal screen was negative, glucose 78, group A strep throat culture pending Medications and treatments: Normal saline bolus 1 L 1, clindamycin 600 mg IV 1   Review of Systems:  In addition to the HPI above,  No Headache, changes with Vision or hearing, new weakness, tingling, numbness in any extremity, No Chest pain, Cough or Shortness of Breath, palpitations, orthopnea or DOE No Abdominal pain, N/V; no melena or hematochezia, no dark tarry stools No dysuria, hematuria or flank pain No new skin rashes, lesions, masses or bruises, No new joints pains-aches No recent weight gain or loss No polyuria, polydypsia or polyphagia,   Past Medical History:  Diagnosis Date  . Anxiety   . Bipolar 1 disorder (HCC)   . Depression   . HIV infection (HCC)   . Neuromuscular disorder (HCC)   . Scabies   . Substance abuse   . Syphilis     Past Surgical History:  Procedure Laterality Date  . EXAMINATION UNDER ANESTHESIA N/A 12/28/2014   Procedure: EXAM UNDER ANESTHESIA;  Surgeon: Viviann Spare  Gross, MD;  Location: [facility];  Service: General;  Laterality: N/A;  . INCISION AND DRAINAGE PERIRECTAL ABSCESS  08/27/2010   Dr Carolynne Edouard  . INCISION AND DRAINAGE PERIRECTAL ABSCESS N/A 12/28/2014   Procedure: IRRIGATION AND DEBRIDEMENT PERIRECTAL ABSCESS;  Surgeon: Karie Soda, MD;  Location: [facility];  Service: General;  Laterality: N/A;  Fistula repair and ablation    Social History   Social History  . Marital status: Single    Spouse name:  N/A  . Number of children: N/A  . Years of education: N/A   Occupational History  . Not on file.   Social History Main Topics  . Smoking status: Current Every Day Smoker    Packs/day: 0.25    Types: Cigarettes  . Smokeless tobacco: Never Used  . Alcohol use 0.0 oz/week  . Drug use: No     Comment: past history   . Sexual activity: Yes    Partners: Male    Birth control/ protection: Condom     Comment: irregular condom use; educated   Other Topics Concern  . Not on file   Social History Narrative  . 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 Hypertension Other      Prior to Admission medications   Medication Sig Start Date End Date Taking? Authorizing Provider  clindamycin (CLEOCIN) 150 MG capsule Take 2 capsules (300 mg total) by mouth 3 (three) times daily. 08/22/16  Yes Tatyana Kirichenko, PA-C  dolutegravir (TIVICAY) 50 MG tablet Take 1 tablet (50 mg total) by mouth daily. 01/26/15  Yes Cliffton Asters, MD  emtricitabine-tenofovir (TRUVADA) 200-300 MG per tablet Take 1 tablet by mouth daily. 01/26/15  Yes Cliffton Asters, MD    Physical Exam: Vitals:   09/11/16 2316 09/12/16 0618  BP: 122/79 122/81  Pulse: 73 86  Resp: 19 14  Temp: 98.9 F (37.2 C) 98.3 F (36.8 C)  TempSrc: Oral Oral  SpO2: 100% 99%  Weight: 67 kg (147 lb 12.8 oz)   Height: 6\' 1"  (1.854 m)       Constitutional: NAD, calm, comfortable although reporting room environment to cold Eyes: PERRL, lids and conjunctivae normal ENMT: Mucous membranes are moist. Posterior pharynx erythematous but uvula normal in size, small ulcerated area in the left upper palate with purulent greenish-appearing discharge there are also multiple ulcerated lesions on the upper and lower palate and smaller ones on the tongue Neck: normal, supple, no masses,  no thyromegaly Respiratory: clear to auscultation bilaterally, no wheezing, no crackles. Normal respiratory effort. No accessory muscle use.  Cardiovascular: Regular rate and rhythm, no murmurs / rubs / gallops. No extremity edema. 2+ pedal pulses. No carotid bruits.  Abdomen: no tenderness, no masses palpated. No hepatosplenomegaly. Bowel sounds positive.  Musculoskeletal: no clubbing / cyanosis. No joint deformity upper and lower extremities. Good ROM, no contractures. Normal muscle tone.  Skin: no ulcers. No induration-patient has chronic-appearing maculopapular rash over entire body but none of these lesions are reddened or draining and appear old in nature Neurologic: CN 2-12 grossly intact. Sensation intact, DTR normal. Strength 5/5 x all 4 extremities.  Psychiatric: Normal judgment and insight. Alert and oriented x 3. Normal mood.    Labs on Admission: I have personally reviewed following labs and imaging studies  CBC:  Recent Labs Lab 09/12/16 0315  WBC  4.9  NEUTROABS 3.9  HGB 12.0*  HCT 35.7*  MCV 92.7  PLT 261   Basic Metabolic Panel:  Recent Labs Lab 09/12/16 0315  NA 139  K 3.4*  CL 107  CO2 24  GLUCOSE 78  BUN 13  CREATININE 0.92  CALCIUM 8.7*   GFR: Estimated Creatinine Clearance: 116.3 mL/min (by C-G formula based on SCr of 0.92 mg/dL). Liver Function Tests: No results for input(s): AST, ALT, ALKPHOS, BILITOT, PROT, ALBUMIN in the last 168 hours. No results for input(s): LIPASE, AMYLASE in the last 168 hours. No results for input(s): AMMONIA in the last 168 hours. Coagulation Profile: No results for input(s): INR, PROTIME in the last 168 hours. Cardiac Enzymes: No results for input(s): CKTOTAL, CKMB, CKMBINDEX, TROPONINI in the last 168 hours. BNP (last 3 results) No results for input(s): PROBNP in the last 8760 hours. HbA1C: No results for input(s): HGBA1C in the last 72 hours. CBG: No results for input(s): GLUCAP in the last 168 hours. Lipid  Profile: No results for input(s): CHOL, HDL, LDLCALC, TRIG, CHOLHDL, LDLDIRECT in the last 72 hours. Thyroid Function Tests: No results for input(s): TSH, T4TOTAL, FREET4, T3FREE, THYROIDAB in the last 72 hours. Anemia Panel: No results for input(s): VITAMINB12, FOLATE, FERRITIN, TIBC, IRON, RETICCTPCT in the last 72 hours. Urine analysis:    Component Value Date/Time   COLORURINE AMBER (A) 09/09/2014 1055   APPEARANCEUR CLEAR 09/09/2014 1055   LABSPEC 1.019 09/09/2014 1055   PHURINE 7.0 09/09/2014 1055   GLUCOSEU NEGATIVE 09/09/2014 1055   HGBUR NEGATIVE 09/09/2014 1055   BILIRUBINUR SMALL (A) 09/09/2014 1055   KETONESUR NEGATIVE 09/09/2014 1055   PROTEINUR NEGATIVE 09/09/2014 1055   UROBILINOGEN 4.0 (H) 09/09/2014 1055   NITRITE NEGATIVE 09/09/2014 1055   LEUKOCYTESUR NEGATIVE 09/09/2014 1055   Sepsis Labs: @LABRCNTIP (procalcitonin:4,lacticidven:4) ) Recent Results (from the past 240 hour(s))  Rapid strep screen     Status: None   Collection Time: 09/11/16 11:25 PM  Result Value Ref Range Status   Streptococcus, Group A Screen (Direct) NEGATIVE NEGATIVE Final    Comment: (NOTE) A Rapid Antigen test may result negative if the antigen level in the sample is below the detection level of this test. The FDA has not cleared this test as a stand-alone test therefore the rapid antigen negative result has reflexed to a Group A Strep culture.      Radiological Exams on Admission: Ct Soft Tissue Neck W Contrast  Result Date: 09/12/2016 CLINICAL DATA:  Initial evaluation for ulcerative lesions on hard palate to bone. Throat pain. History of HIV. EXAM: CT NECK WITH CONTRAST TECHNIQUE: Multidetector CT imaging of the neck was performed using the standard protocol following the bolus administration of intravenous contrast. CONTRAST:  75 cc of Isovue-300. COMPARISON:  None available. FINDINGS: Pharynx and larynx: Oral tongue within normal limits without acute abnormality. Circumscribed  ovoid hypodense lesion measuring 1.9 x 1.5 x 1.5 cm present at the right floor of mouth (series 2, image 63). No significant rim enhancement or associated inflammatory changes. Overlying lingual aspect of the alveolar ridge is intact without dehiscence or evidence of significant dental disease within this region. Left floor of mouth without abnormality. Probable ulcerative lesions noted at the junction of the hard and soft palate as described in provided history (series 2, image 40). These extend immediately posterior to the hard palate with extension into the left posterior nasal cavity (series 4, image 47). No associated abscess. Palatine tonsils symmetric and within normal limits. Parapharyngeal fat grossly  preserved. Nasopharynx within normal limits. Remainder of the oropharynx within normal limits without significant inflammatory changes. No retropharyngeal fluid collection. Epiglottis is normal. Vallecula is clear. Remainder of the hypopharynx and supraglottic larynx are normal. True cords symmetric and within normal limits. Subglottic airway is clear. Salivary glands: Parotid glands and submandibular glands are within normal limits. No stones identified. Thyroid: Thyroid gland normal. Lymph nodes: Shotty sub cm cervical lymph nodes present within the neck bilaterally. No pathologically enlarged lymph nodes identified. Vascular: Normal intravascular enhancement seen throughout the neck. Limited intracranial: Visualized portions of the brain are unremarkable. Visualized orbits: Globes and orbits within normal limits. Mastoids and visualized paranasal sinuses: Mastoid air cells are clear. Middle ear cavities are well pneumatized. Mucosal thickening present within the bilateral maxillary sinuses, left greater than right. Mucosal thickening with opacity within the sphenoid sinuses bilaterally, also worse on the left. Mild left-sided ethmoidal sinus disease. Frontal sinuses are clear. Skeleton: No acute osseous  abnormality. No worrisome lytic or blastic osseous lesions. Upper chest: Scattered nodular tree-in-bud with ground-glass opacities within the partially visualized lungs, suspicious for possible endobronchial infection. IMPRESSION: 1. At least 2 adjacent ulcerative lesions involving the central and left soft palate, with extension into the left nasal cavity as above. Hard palate itself remains intact. 2. 1.9 x 1.5 x 1.5 cm hypodensity within the right floor of mouth, suspicious for possible phlegmon/abscess. Correlation with physical exam recommended. 3. Nodular and ground-glass opacities within the partially visualized lungs, suspicious for possible acute endobronchial infection/small airways disease. 4. Mild to moderate paranasal sinus disease as above, worse on the left. Electronically Signed   By: Rise Mu M.D.   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 Suszanne Conners will see patient later today -There is also a circumferential hypodense lesion in the floor of the right side of the mouth -Spoke with ENT and no indications for surgery at this juncture so okay to begin diet-full liquid with protein supplementation initially -Continue Clindamycin IV -IV morphine for moderate to severe pain with scheduled Toradol-hopefully can transition to oral agents within 24 hours the patient demonstrates tolerance of diet  Active Problems:   AIDS  -Noncompliant with medications with last reported CD4 count 90 several months ago but in review of the electronic medical record this CD4 count of 90 was in May 2016 -ID to see today -Check CD4 count and HIV RNA/viral load -Patient reports last ART meds at least 6 months ago; will defer to ID regarding resumption of medications    H/O noncompliance with medical treatment - finincial, presenting hazards to health -In the past patient has related this to his  finances but he is currently working a full time job -Based on the electronic medical record the patient's last formal office visit with the ID clinic was in January 2016 -I suspect a degree of denial regarding significance of severity of illness and need for ongoing continued treatment as well as patient's underlying psychiatric condition are contributing to patient's noncompliance    Acute hypokalemia -Potassium in maintenance fluids    Anemia -New finding -Suspect related to untreated chronic medical conditions -Patient's weight has been steadily decreasing since September 2016 with a peak weight of 160 pounds; patient currently weighs 147 pounds -Nutrition consultation -Anemia panel    Bipolar disorder  -Not on medications prior to admission    Alcohol use  -Patient having much difficulty attempting to quantify  the amount of alcohol he drinks even when directly asked if he drank daily-finally stated "I really can't tell you how much I drink adequately think I drink daily but just put me down for drinking daily" -As a precaution MedSurg CIWA has been ordered      DVT prophylaxis: Subcutaneous heparin Code Status: Full  Family Communication: No family at bedside Disposition Plan: Anticipate discharge back to preadmission home environment once medically stable Consults called: Infectious disease/Dr. Ninetta Lights; ENT/Dr. Kris Hartmann were contacted by EDP but requested repage in the a.m. **spoke w/ Dr. Suszanne Conners at 835am; **spoke with Dr. Ninetta Lights at 837am    Russella Dar. ANP-BC [facility] Pager [phone]   If 7PM-7AM, please contact night-coverage [email or web address] Password [email or web address]  09/12/2016, 7:51 AM

## 2016-09-12 NOTE — ED Notes (Signed)
Notified by pharmacy that HPV vaccine is not stocked in hospital and we have to be brought in.

## 2016-09-12 NOTE — ED Provider Notes (Signed)
[facility]EMERGENCY DEPT Provider Note   CSN: [id number] Arrival date & time: 09/11/16  2245     History   Chief Complaint Chief Complaint  Patient presents with  . Sore Throat    HPI Justin Ball is a 25 y.o. male.  Justin Ball is a 25 y.o. male with history of AIDS presents to ED with complaint of sore throat and mouth pain. Patient reports pain started approximately two weeks ago and has progressively worsened. He has oral lesions and states "there are holes in the top of my mouth." He also states when he drinks fluids they come out through his nose and he has had purulent nasal discharge. Patient is able to swallow; however, painful.  He is managing his oral secretions. He endorses associated headache, left ear pain, nasal congestion, sinus pressure, rhinorrhea, left cervical lymphadenopathy, and left anterior neck pain. He denies fever, changes in vision, eye pain, shortness of breath, chest pain, abdominal pain, N/V, dysuria, hematuria. Patient states he has not followed up with his provider regarding his AIDS and is not currently on anti-retroviral medications. When asked about last CD4 counts, he states "they were bad." Review of records show last CD4 count approximately one year ago at ~90.    The history is provided by the patient and medical records.    Past Medical History:  Diagnosis Date  . Anxiety   . Bipolar 1 disorder (HCC)   . Depression   . HIV infection (HCC)   . Neuromuscular disorder (HCC)   . Scabies   . Substance abuse   . Syphilis     Patient Active Problem List   Diagnosis Date Noted  . AIDS (HCC) 01/19/2015  . 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 H/O noncompliance with medical treatment - finincial, presenting hazards to health 12/28/2014  . Rectal abscess 12/28/2014  . Intersphincteric abscess 12/28/2014  . Bipolar disorder (HCC) 04/08/2013  . Muscle ache 01/06/2013  . Lower back pain 01/06/2013  . Insomnia 01/06/2013  .  HIV (human immunodeficiency virus infection) (HCC) 10/10/2012  . Rash and nonspecific skin eruption 10/10/2012  . Rectal discharge 10/10/2012  . Syphilis 10/10/2012    Past Surgical History:  Procedure Laterality Date  . EXAMINATION UNDER ANESTHESIA N/A 12/28/2014   Procedure: EXAM UNDER ANESTHESIA;  Surgeon: Karie Soda, MD;  Location: [facility];  Service: General;  Laterality: N/A;  . INCISION AND DRAINAGE PERIRECTAL ABSCESS  08/27/2010   Dr Carolynne Edouard  . INCISION AND DRAINAGE PERIRECTAL ABSCESS N/A 12/28/2014   Procedure: IRRIGATION AND DEBRIDEMENT PERIRECTAL ABSCESS;  Surgeon: Karie Soda, MD;  Location: [facility];  Service: General;  Laterality: N/A;  Fistula repair and ablation       Home Medications    Prior to Admission medications   Medication Sig Start Date End Date Taking? Authorizing Provider  amoxicillin-clavulanate (AUGMENTIN) 875-125 MG tablet Take 1 tablet by mouth every 12 (twelve) hours. 05/10/16   Burgess Amor, PA-C  azithromycin (ZITHROMAX) 600 MG tablet Take 2 tablets (1,200 mg total) by mouth once a week. Patient not taking: Reported on 10/17/2015 01/19/15   Randall Hiss, MD  clindamycin (CLEOCIN) 150 MG capsule Take 2 capsules (300 mg total) by mouth 3 (three) times daily. 08/22/16   Tatyana Kirichenko, PA-C  dolutegravir (TIVICAY) 50 MG tablet Take 1 tablet (50 mg total) by mouth daily. 01/26/15   Cliffton Asters, MD  emtricitabine-tenofovir (TRUVADA) 200-300 MG per tablet Take 1 tablet by mouth daily. 01/26/15  Cliffton Asters, MD  ibuprofen (ADVIL,MOTRIN) 800 MG tablet Take 1 tablet (800 mg total) by mouth 3 (three) times daily. Patient not taking: Reported on 05/10/2016 09/29/15   Garlon Hatchet, PA-C  nystatin-triamcinolone ointment [facility]) Apply 1 application topically 2 (two) times daily. 08/22/16   Jaynie Crumble, PA-C    Family History Family History  Problem Relation Age of Onset  . Hypertension Other     Social History Social History  Substance Use Topics  .  Smoking status: Current Every Day Smoker    Packs/day: 0.25    Types: Cigarettes  . Smokeless tobacco: Never Used  . Alcohol use 0.0 oz/week     Allergies   Review of patient's allergies indicates no known allergies.   Review of Systems Review of Systems  Constitutional: Negative for chills, diaphoresis and fever.  HENT: Positive for congestion, ear pain, rhinorrhea, sinus pressure and sore throat. Negative for trouble swallowing.   Eyes: Negative for visual disturbance.  Respiratory: Negative for shortness of breath.   Cardiovascular: Negative for chest pain.  Gastrointestinal: Negative for abdominal pain, constipation, diarrhea, nausea and vomiting.  Genitourinary: Negative for dysuria and hematuria.  Musculoskeletal: Positive for neck pain ( left, anterior ).  Skin: Negative for rash.  Allergic/Immunologic: Positive for immunocompromised state.  Neurological: Positive for headaches.  Hematological: Positive for adenopathy ( left cervical).     Physical Exam Updated Vital Signs BP 122/79 (BP Location: Right Arm)   Pulse 73   Temp 98.9 F (37.2 C) (Oral)   Resp 19   Ht 6\' 1"  (1.854 m)   Wt 67 kg   SpO2 100%   BMI 19.50 kg/m   Physical Exam  Constitutional: He appears well-developed and well-nourished. No distress.  HENT:  Head: Normocephalic and atraumatic.  Mouth/Throat: Uvula is midline and oropharynx is clear and moist. No trismus in the jaw.  No trismus. Uvula midline. Patient managing oral secretions. Small white plaques on hard palate and right buccal mucosa. White ulcer noted in posterior right buccal mucosa. Two ulcerations noted on hard palate - left ulceration deep with possible bone exposure.   Eyes: Conjunctivae and EOM are normal. Pupils are equal, round, and reactive to light. Right eye exhibits no discharge. Left eye exhibits no discharge. No scleral icterus.  PERRL. EOMs intact without pain.   Neck: Normal range of motion and phonation normal. Neck  supple. No neck rigidity. Normal range of motion present.  No stridor. No nuchal rigidity.   Cardiovascular: Normal rate, regular rhythm, normal heart sounds and intact distal pulses.   No murmur heard. Pulmonary/Chest: Effort normal and breath sounds normal. No stridor. No respiratory distress. He has no wheezes. He has no rales.  Abdominal: Soft. Bowel sounds are normal. He exhibits no distension. There is no tenderness. There is no rigidity, no rebound, no guarding and no CVA tenderness.  Musculoskeletal: Normal range of motion.  Lymphadenopathy:    He has cervical adenopathy.       Left cervical: Superficial cervical adenopathy present.  Neurological: He is alert. He is not disoriented. Coordination and gait normal. GCS eye subscore is 4. GCS verbal subscore is 5. GCS motor subscore is 6.  Skin: Skin is warm and dry. He is not diaphoretic.  Psychiatric: He has a normal mood and affect. His behavior is normal.     ED Treatments / Results  Labs (all labs ordered are listed, but only abnormal results are displayed) Labs Reviewed  CBC WITH DIFFERENTIAL/PLATELET - Abnormal; Notable for  the following:       Result Value   RBC 3.85 (*)    Hemoglobin 12.0 (*)    HCT 35.7 (*)    Lymphs Abs 0.5 (*)    All other components within normal limits  BASIC METABOLIC PANEL - Abnormal; Notable for the following:    Potassium 3.4 (*)    Calcium 8.7 (*)    All other components within normal limits  RAPID STREP SCREEN (NOT AT [facility])  CULTURE, GROUP A STREP [facility])    EKG  EKG Interpretation None       Radiology No results found.  Procedures Procedures (including critical care time)  Medications Ordered in ED Medications  sodium chloride 0.9 % bolus 1,000 mL (1,000 mLs Intravenous New Bag/Given 09/12/16 0337)  iopamidol (ISOVUE-300) 61 % injection (not administered)     Initial Impression / Assessment and Plan / ED Course  I have reviewed the triage vital signs and the nursing  notes.  Pertinent labs & imaging results that were available during my care of the patient were reviewed by me and considered in my medical decision making (see chart for details).  Clinical Course    Patient presents to ED . Patient is afebrile and non-toxic appearing in NAD. VSS. Physical exam remarkable for small white plaques in oral cavity and multiple oral ulcerations; of particular concern is a deep ulceration in the left hard palate with possible bone exposure. Patient reports when drinking fluid "comes out his nose." Patient has mild left cervical lymphadenopathy. No nuchal rigidity, stridor, or trismus. Given physical exam findings and h/o HIV, discussed patient with Dr. Elesa MassedWard who also evaluated paient. Concern for possible communication from hard palate to maxillary sinus. Basic labs and CT ordered.   Rapid strep negative. BMP re-assuring. Hemoglobin slightly low at 12, otherwise CBC re-assuring. At shift change CT exam pending. Care assumed by Dr. Elesa MassedWard. Disposition pending CT findings.    Final Clinical Impressions(s) / ED Diagnoses   Final diagnoses:  Oral pain    New Prescriptions New Prescriptions   No medications on file     Lona Kettleshley Laurel Fitzpatrick [place], PA-C 09/12/16 0437    Layla MawKristen N Ward, DO 09/12/16 [phone]

## 2016-09-12 NOTE — Consult Note (Addendum)
[facility] for Infectious Disease  Date of Admission:  09/12/2016  Date of Consult:  09/12/2016  Reason for Consult:AIDS,  Referring Physician: Lissa Merlin  Impression/Recommendation AIDS (M184V) Restart medications- prezcobix, descovy, tivicay Check all his labs including STDs and genotype HPV vaccine   Oral ulcers Hold anbx until bx ENT eval biopsy  Anal CA/AIN3 He has prev been seen by surgery.  Will assess his f/u.   Thank you so much for this interesting consult,   Bobby Rumpf (pager) [phone] [email or web address]  KRISTOFFER BALA is an 25 y.o. male.  HPI: 25 yo M with hx of AIDS (Tivicay/darunavir-norvir/truvada), poor compliance, comes to ED with 2 week hx of sore throat, and development of ulcers on the roof of his mouth. He has had decreased po during this period. No f/c.   Past Medical History:  Diagnosis Date  . Anxiety   . Bipolar 1 disorder ([place])   . Depression   . HIV infection ([place])   . Neuromuscular disorder ([place])   . Scabies   . Substance abuse   . Syphilis     Past Surgical History:  Procedure Laterality Date  . EXAMINATION UNDER ANESTHESIA N/A 12/28/2014   Procedure: EXAM UNDER ANESTHESIA;  Surgeon: Michael Boston, MD;  Location: [facility];  Service: General;  Laterality: N/A;  . INCISION AND DRAINAGE PERIRECTAL ABSCESS  08/27/2010   Dr Marlou Starks  . INCISION AND DRAINAGE PERIRECTAL ABSCESS N/A 12/28/2014   Procedure: IRRIGATION AND DEBRIDEMENT PERIRECTAL ABSCESS;  Surgeon: Michael Boston, MD;  Location: [facility];  Service: General;  Laterality: N/A;  Fistula repair and ablation     No Known Allergies  Medications:  Scheduled: . feeding supplement (ENSURE ENLIVE)  237 mL Oral BID BM  . folic acid  1 mg Oral Daily  . iopamidol      . ketorolac  15 mg Intravenous Q6H  . LORazepam  0-4 mg Intravenous Q6H   Followed by  . [START ON 09/14/2016] LORazepam  0-4 mg Intravenous Q12H  . multivitamin with minerals  1 tablet Oral Daily  . thiamine  100  mg Oral Daily   Or  . thiamine  100 mg Intravenous Daily    Abtx:  Anti-infectives    Start     Dose/Rate Route Frequency Ordered Stop   09/12/16 1400  clindamycin (CLEOCIN) IVPB 600 mg     600 mg 100 mL/hr over 30 Minutes Intravenous Every 8 hours 09/12/16 0748     09/12/16 0545  clindamycin (CLEOCIN) IVPB 600 mg     600 mg 100 mL/hr over 30 Minutes Intravenous  Once 09/12/16 0544 09/12/16 0820      Total days of antibiotics: 0 clinda          Social History:  reports that he has been smoking Cigarettes.  He has been smoking about 0.25 packs per day. He has never used smokeless tobacco. He reports that he drinks alcohol. He reports that he does not use drugs.  Family History  Problem Relation Age of Onset  . Hypertension Other   parents healthy.   General ROS: see HPI. 20# wt loss, normal BM, normal urine.   Blood pressure 116/86, pulse 76, temperature 98.3 F (36.8 C), temperature source Oral, resp. rate 15, height _0  (1.854 m), weight 67 kg (147 lb 12.8 oz), SpO2 100 %. General appearance: alert, cooperative and no distress Eyes: negative findings: conjunctivae and sclerae normal and pupils equal, round, reactive to light and accomodation Throat:  normal findings: lips normal without lesions and oropharynx pink & moist without lesions or evidence of thrush and abnormal findings: on R palate there is a ~ 71m ulcer with white d/c. on the L there is a ~5 mm ulcer that appears deep. no d/c.  Neck: no adenopathy and supple, symmetrical, trachea midline Lungs: clear to auscultation bilaterally Heart: regular rate and rhythm Abdomen: normal findings: bowel sounds normal and soft, non-tender Extremities: edema none and no rashes.     Results for orders placed or performed during the hospital encounter of 09/12/16 (from the past 48 hour(s))  Rapid strep screen     Status: None   Collection Time: 09/11/16 11:25 PM  Result Value Ref Range   Streptococcus, Group A Screen  (Direct) NEGATIVE NEGATIVE    Comment: (NOTE) A Rapid Antigen test may result negative if the antigen level in the sample is below the detection level of this test. The FDA has not cleared this test as a stand-alone test therefore the rapid antigen negative result has reflexed to a Group A Strep culture.   CBC with Differential     Status: Abnormal   Collection Time: 09/12/16  3:15 AM  Result Value Ref Range   WBC 4.9 4.0 - 10.5 K/uL   RBC 3.85 (L) 4.22 - 5.81 MIL/uL   Hemoglobin 12.0 (L) 13.0 - 17.0 g/dL   HCT 35.7 (L) 39.0 - 52.0 %   MCV 92.7 78.0 - 100.0 fL   MCH 31.2 26.0 - 34.0 pg   MCHC 33.6 30.0 - 36.0 g/dL   RDW 14.4 11.5 - 15.5 %   Platelets 261 150 - 400 K/uL   Neutrophils Relative % 78 %   Lymphocytes Relative 11 %   Monocytes Relative 9 %   Eosinophils Relative 2 %   Basophils Relative 0 %   Neutro Abs 3.9 1.7 - 7.7 K/uL   Lymphs Abs 0.5 (L) 0.7 - 4.0 K/uL   Monocytes Absolute 0.4 0.1 - 1.0 K/uL   Eosinophils Absolute 0.1 0.0 - 0.7 K/uL   Basophils Absolute 0.0 0.0 - 0.1 K/uL   WBC Morphology TOXIC GRANULATION     Comment: VACUOLATED NEUTROPHILS MILD LEFT SHIFT (1-5% METAS, OCC MYELO, OCC BANDS)   Basic metabolic panel     Status: Abnormal   Collection Time: 09/12/16  3:15 AM  Result Value Ref Range   Sodium 139 135 - 145 mmol/L   Potassium 3.4 (L) 3.5 - 5.1 mmol/L   Chloride 107 101 - 111 mmol/L   CO2 24 22 - 32 mmol/L   Glucose, Bld 78 65 - 99 mg/dL   BUN 13 6 - 20 mg/dL   Creatinine, Ser 0.92 0.61 - 1.24 mg/dL   Calcium 8.7 (L) 8.9 - 10.3 mg/dL   GFR calc non Af Amer >60 >60 mL/min   GFR calc Af Amer >60 >60 mL/min    Comment: (NOTE) The eGFR has been calculated using the CKD EPI equation. This calculation has not been validated in all clinical situations. eGFR's persistently <60 mL/min signify possible Chronic Kidney Disease.    Anion gap 8 5 - 15  Reticulocytes     Status: Abnormal   Collection Time: 09/12/16  8:21 AM  Result Value Ref Range    Retic Ct Pct 0.5 0.4 - 3.1 %   RBC. 3.57 (L) 4.22 - 5.81 MIL/uL   Retic Count, Manual 17.9 (L) 19.0 - 186.0 K/uL      Component Value Date/Time   SDES  ABSCESS ANAL 08/27/2010 1619   SDES ABSCESS ANAL 08/27/2010 1619   SPECREQUEST IMMUNE:NORM 08/27/2010 1619   SPECREQUEST IMMUNE:NORM 08/27/2010 1619   CULT  08/27/2010 1619    Multiple Species Consistent With Normal Fecal Flora Note: NO STAPHYLOCOCCUS AUREUS ISOLATED NO GROUP A STREP (S.PYOGENES) ISOLATED   CULT  08/27/2010 1619    FEW PREVOTELLA BIVIA Note: BETA LACTAMASE POSITIVE   REPTSTATUS 08/30/2010 FINAL 08/27/2010 1619   REPTSTATUS 09/03/2010 FINAL 08/27/2010 1619   Ct Soft Tissue Neck W Contrast  Result Date: 09/12/2016 CLINICAL DATA:  Initial evaluation for ulcerative lesions on hard palate to bone. Throat pain. History of HIV. EXAM: CT NECK WITH CONTRAST TECHNIQUE: Multidetector CT imaging of the neck was performed using the standard protocol following the bolus administration of intravenous contrast. CONTRAST:  75 cc of Isovue-300. COMPARISON:  None available. FINDINGS: Pharynx and larynx: Oral tongue within normal limits without acute abnormality. Circumscribed ovoid hypodense lesion measuring 1.9 x 1.5 x 1.5 cm present at the right floor of mouth (series 2, image 63). No significant rim enhancement or associated inflammatory changes. Overlying lingual aspect of the alveolar ridge is intact without dehiscence or evidence of significant dental disease within this region. Left floor of mouth without abnormality. Probable ulcerative lesions noted at the junction of the hard and soft palate as described in provided history (series 2, image 40). These extend immediately posterior to the hard palate with extension into the left posterior nasal cavity (series 4, image 47). No associated abscess. Palatine tonsils symmetric and within normal limits. Parapharyngeal fat grossly preserved. Nasopharynx within normal limits. Remainder of the  oropharynx within normal limits without significant inflammatory changes. No retropharyngeal fluid collection. Epiglottis is normal. Vallecula is clear. Remainder of the hypopharynx and supraglottic larynx are normal. True cords symmetric and within normal limits. Subglottic airway is clear. Salivary glands: Parotid glands and submandibular glands are within normal limits. No stones identified. Thyroid: Thyroid gland normal. Lymph nodes: Shotty sub cm cervical lymph nodes present within the neck bilaterally. No pathologically enlarged lymph nodes identified. Vascular: Normal intravascular enhancement seen throughout the neck. Limited intracranial: Visualized portions of the brain are unremarkable. Visualized orbits: Globes and orbits within normal limits. Mastoids and visualized paranasal sinuses: Mastoid air cells are clear. Middle ear cavities are well pneumatized. Mucosal thickening present within the bilateral maxillary sinuses, left greater than right. Mucosal thickening with opacity within the sphenoid sinuses bilaterally, also worse on the left. Mild left-sided ethmoidal sinus disease. Frontal sinuses are clear. Skeleton: No acute osseous abnormality. No worrisome lytic or blastic osseous lesions. Upper chest: Scattered nodular tree-in-bud with ground-glass opacities within the partially visualized lungs, suspicious for possible endobronchial infection. IMPRESSION: 1. At least 2 adjacent ulcerative lesions involving the central and left soft palate, with extension into the left nasal cavity as above. Hard palate itself remains intact. 2. 1.9 x 1.5 x 1.5 cm hypodensity within the right floor of mouth, suspicious for possible phlegmon/abscess. Correlation with physical exam recommended. 3. Nodular and ground-glass opacities within the partially visualized lungs, suspicious for possible acute endobronchial infection/small airways disease. 4. Mild to moderate paranasal sinus disease as above, worse on the left.  Electronically Signed   By: Jeannine Boga M.D.   On: 09/12/2016 05:25   Recent Results (from the past 240 hour(s))  Rapid strep screen     Status: None   Collection Time: 09/11/16 11:25 PM  Result Value Ref Range Status   Streptococcus, Group A Screen (Direct) NEGATIVE NEGATIVE Final  Comment: (NOTE) A Rapid Antigen test may result negative if the antigen level in the sample is below the detection level of this test. The FDA has not cleared this test as a stand-alone test therefore the rapid antigen negative result has reflexed to a Group A Strep culture.       09/12/2016, 9:19 AM     LOS: 0 days    Records and images were personally reviewed where available.

## 2016-09-12 NOTE — Consult Note (Signed)
Reason for Consult: Oral ulcer, sore throat, HIV, AIDS Referring Physician: Pryor Curia, MD  HPI:  Justin Ball is an 25 y.o. male who presents to the ER today c/o increasing sore throat. The patient has a history of HIV/AIDS, noncompliant with antiretroviral therapies in the past 6 months. He reports for the past 2 weeks, he has been experiencing a persistent sore throat with sinus pressure and bilateral eye drainage. Over the past week he has noticed some patches/ulcers all through his mouth including on the upper palate. There was one area on the left side of the palate which was larger and the most painful and over time this area has opened up and has drained mucous secretions. Because of the pain he has been unable to eat or drink adequately. His CT scan today also shows a possible phlegmon/abscess at the right floor of mouth.  Past Medical History:  Diagnosis Date  . Anxiety   . Bipolar 1 disorder ([name])   . Depression   . HIV infection ([place])   . Neuromuscular disorder ([place])   . Scabies   . Substance abuse   . Syphilis     Past Surgical History:  Procedure Laterality Date  . EXAMINATION UNDER ANESTHESIA N/A 12/28/2014   Procedure: EXAM UNDER ANESTHESIA;  Surgeon: Michael Boston, MD;  Location: [facility];  Service: General;  Laterality: N/A;  . INCISION AND DRAINAGE PERIRECTAL ABSCESS  08/27/2010   Dr Marlou Starks  . INCISION AND DRAINAGE PERIRECTAL ABSCESS N/A 12/28/2014   Procedure: IRRIGATION AND DEBRIDEMENT PERIRECTAL ABSCESS;  Surgeon: Michael Boston, MD;  Location: [facility];  Service: General;  Laterality: N/A;  Fistula repair and ablation    Family History  Problem Relation Age of Onset  . Hypertension Other     Social History:  reports that he has been smoking Cigarettes.  He has been smoking about 0.25 packs per day. He has never used smokeless tobacco. He reports that he drinks alcohol. He reports that he does not use drugs.  Allergies: No Known Allergies  Prior to Admission  medications   Medication Sig Start Date End Date Taking? Authorizing Provider  clindamycin (CLEOCIN) 150 MG capsule Take 2 capsules (300 mg total) by mouth 3 (three) times daily. 08/22/16  Yes Tatyana Kirichenko, PA-C  dolutegravir (TIVICAY) 50 MG tablet Take 1 tablet (50 mg total) by mouth daily. 01/26/15  Yes Michel Bickers, MD  emtricitabine-tenofovir (TRUVADA) 200-300 MG per tablet Take 1 tablet by mouth daily. 01/26/15  Yes Michel Bickers, MD    Results for orders placed or performed during the hospital encounter of 09/12/16 (from the past 48 hour(s))  Rapid strep screen     Status: None   Collection Time: 09/11/16 11:25 PM  Result Value Ref Range   Streptococcus, Group A Screen (Direct) NEGATIVE NEGATIVE    Comment: (NOTE) A Rapid Antigen test may result negative if the antigen level in the sample is below the detection level of this test. The FDA has not cleared this test as a stand-alone test therefore the rapid antigen negative result has reflexed to a Group A Strep culture.   CBC with Differential     Status: Abnormal   Collection Time: 09/12/16  3:15 AM  Result Value Ref Range   WBC 4.9 4.0 - 10.5 K/uL   RBC 3.85 (L) 4.22 - 5.81 MIL/uL   Hemoglobin 12.0 (L) 13.0 - 17.0 g/dL   HCT 35.7 (L) 39.0 - 52.0 %   MCV 92.7 78.0 - 100.0 fL  MCH 31.2 26.0 - 34.0 pg   MCHC 33.6 30.0 - 36.0 g/dL   RDW 14.4 11.5 - 15.5 %   Platelets 261 150 - 400 K/uL   Neutrophils Relative % 78 %   Lymphocytes Relative 11 %   Monocytes Relative 9 %   Eosinophils Relative 2 %   Basophils Relative 0 %   Neutro Abs 3.9 1.7 - 7.7 K/uL   Lymphs Abs 0.5 (L) 0.7 - 4.0 K/uL   Monocytes Absolute 0.4 0.1 - 1.0 K/uL   Eosinophils Absolute 0.1 0.0 - 0.7 K/uL   Basophils Absolute 0.0 0.0 - 0.1 K/uL   WBC Morphology TOXIC GRANULATION     Comment: VACUOLATED NEUTROPHILS MILD LEFT SHIFT (1-5% METAS, OCC MYELO, OCC BANDS)   Basic metabolic panel     Status: Abnormal   Collection Time: 09/12/16  3:15 AM  Result  Value Ref Range   Sodium 139 135 - 145 mmol/L   Potassium 3.4 (L) 3.5 - 5.1 mmol/L   Chloride 107 101 - 111 mmol/L   CO2 24 22 - 32 mmol/L   Glucose, Bld 78 65 - 99 mg/dL   BUN 13 6 - 20 mg/dL   Creatinine, Ser 0.92 0.61 - 1.24 mg/dL   Calcium 8.7 (L) 8.9 - 10.3 mg/dL   GFR calc non Af Amer >60 >60 mL/min   GFR calc Af Amer >60 >60 mL/min    Comment: (NOTE) The eGFR has been calculated using the CKD EPI equation. This calculation has not been validated in all clinical situations. eGFR's persistently <60 mL/min signify possible Chronic Kidney Disease.    Anion gap 8 5 - 15  Vitamin B12     Status: Abnormal   Collection Time: 09/12/16  8:21 AM  Result Value Ref Range   Vitamin B-12 1,277 (H) 180 - 914 pg/mL    Comment: (NOTE) This assay is not validated for testing neonatal or myeloproliferative syndrome specimens for Vitamin B12 levels.   Folate     Status: Abnormal   Collection Time: 09/12/16  8:21 AM  Result Value Ref Range   Folate 5.6 (L) >5.9 ng/mL  Iron and TIBC     Status: Abnormal   Collection Time: 09/12/16  8:21 AM  Result Value Ref Range   Iron 32 (L) 45 - 182 ug/dL   TIBC 211 (L) 250 - 450 ug/dL   Saturation Ratios 15 (L) 17.9 - 39.5 %   UIBC 179 ug/dL  Ferritin     Status: Abnormal   Collection Time: 09/12/16  8:21 AM  Result Value Ref Range   Ferritin 1,011 (H) 24 - 336 ng/mL  Reticulocytes     Status: Abnormal   Collection Time: 09/12/16  8:21 AM  Result Value Ref Range   Retic Ct Pct 0.5 0.4 - 3.1 %   RBC. 3.57 (L) 4.22 - 5.81 MIL/uL   Retic Count, Manual 17.9 (L) 19.0 - 186.0 K/uL    Ct Soft Tissue Neck W Contrast  Result Date: 09/12/2016 CLINICAL DATA:  Initial evaluation for ulcerative lesions on hard palate to bone. Throat pain. History of HIV. EXAM: CT NECK WITH CONTRAST TECHNIQUE: Multidetector CT imaging of the neck was performed using the standard protocol following the bolus administration of intravenous contrast. CONTRAST:  75 cc of  Isovue-300. COMPARISON:  None available. FINDINGS: Pharynx and larynx: Oral tongue within normal limits without acute abnormality. Circumscribed ovoid hypodense lesion measuring 1.9 x 1.5 x 1.5 cm present at the right floor of mouth (  series 2, image 63). No significant rim enhancement or associated inflammatory changes. Overlying lingual aspect of the alveolar ridge is intact without dehiscence or evidence of significant dental disease within this region. Left floor of mouth without abnormality. Probable ulcerative lesions noted at the junction of the hard and soft palate as described in provided history (series 2, image 40). These extend immediately posterior to the hard palate with extension into the left posterior nasal cavity (series 4, image 47). No associated abscess. Palatine tonsils symmetric and within normal limits. Parapharyngeal fat grossly preserved. Nasopharynx within normal limits. Remainder of the oropharynx within normal limits without significant inflammatory changes. No retropharyngeal fluid collection. Epiglottis is normal. Vallecula is clear. Remainder of the hypopharynx and supraglottic larynx are normal. True cords symmetric and within normal limits. Subglottic airway is clear. Salivary glands: Parotid glands and submandibular glands are within normal limits. No stones identified. Thyroid: Thyroid gland normal. Lymph nodes: Shotty sub cm cervical lymph nodes present within the neck bilaterally. No pathologically enlarged lymph nodes identified. Vascular: Normal intravascular enhancement seen throughout the neck. Limited intracranial: Visualized portions of the brain are unremarkable. Visualized orbits: Globes and orbits within normal limits. Mastoids and visualized paranasal sinuses: Mastoid air cells are clear. Middle ear cavities are well pneumatized. Mucosal thickening present within the bilateral maxillary sinuses, left greater than right. Mucosal thickening with opacity within the  sphenoid sinuses bilaterally, also worse on the left. Mild left-sided ethmoidal sinus disease. Frontal sinuses are clear. Skeleton: No acute osseous abnormality. No worrisome lytic or blastic osseous lesions. Upper chest: Scattered nodular tree-in-bud with ground-glass opacities within the partially visualized lungs, suspicious for possible endobronchial infection. IMPRESSION: 1. At least 2 adjacent ulcerative lesions involving the central and left soft palate, with extension into the left nasal cavity as above. Hard palate itself remains intact. 2. 1.9 x 1.5 x 1.5 cm hypodensity within the right floor of mouth, suspicious for possible phlegmon/abscess. Correlation with physical exam recommended. 3. Nodular and ground-glass opacities within the partially visualized lungs, suspicious for possible acute endobronchial infection/small airways disease. 4. Mild to moderate paranasal sinus disease as above, worse on the left. Electronically Signed   By: Jeannine Boga M.D.   On: 09/12/2016 05:25   Review of Systems:  In addition to the HPI above,  No Headache, changes with Vision or hearing, new weakness, tingling, numbness in any extremity, No Chest pain, Cough or Shortness of Breath, palpitations, orthopnea or DOE No Abdominal pain, N/V; no melena or hematochezia, no dark tarry stools No dysuria, hematuria or flank pain No new skin rashes, lesions, masses or bruises, No new joints pains-aches No recent weight gain or loss No polyuria, polydypsia or polyphagia  Blood pressure 126/77, pulse 76, temperature 98.3 F (36.8 C), temperature source Oral, resp. rate 16, height '6\' 1"'  (1.854 m), weight 147 lb 12.8 oz (67 kg), SpO2 100 %.  Physical Exam  Constitutional: He appears well-developed and well-nourished. No distress.  Head: Normocephalic and atraumatic.  Mouth/Throat: Uvula is midline and oropharynx is clear and moist. No trismus. Patient managing oral secretions.  White ulcer noted in posterior  right buccal mucosa. Two ulcerations noted on palate - left ulceration is deeper. The right floor of mouth is tender to palpation. Eyes: Conjunctivae and EOM are normal. Pupils are equal, round, and reactive to light. Right eye exhibits no discharge. Left eye exhibits no discharge. No scleral icterus.   Neck: Normal range of motion and phonation normal. Neck supple. No neck rigidity. Normal range of  motion present.  No stridor. No nuchal rigidity.   Cardiovascular: Normal rate, regular rhythm, normal heart sounds and intact distal pulses.   Pulmonary/Chest: Effort normal and breath sounds normal. No stridor. No respiratory distress. He has no wheezes.  Musculoskeletal: Normal range of motion.  Lymphadenopathy: He has multiple cervical adenopathy.  Neurological: He is alert and oriented x3. Skin: Skin is warm and dry. He is not diaphoretic.  Psychiatric: He has a normal mood and affect. His behavior is normal.   Assessment/Plan: Pt with right floor of mouth infection (phlegmon vs abscess) and multiple oral ulcers. Proceed with IV abx (ok with clindamycin). If he continues to be symptomatic after 48 hours, will consider I&D/biopsy in the OR.  Beyza Bellino,SUI W 09/12/2016, 11:01 AM

## 2016-09-12 NOTE — ED Notes (Signed)
Justin Ball from lab called and stated she need to receive "3 more lavender top tubes in order to run all tests ordered."  Phlebotomist made aware.

## 2016-09-13 DIAGNOSIS — E876 Hypokalemia: Secondary | ICD-10-CM

## 2016-09-13 LAB — T-HELPER CELLS (CD4) COUNT (NOT AT ARMC)
CD4 T CELL ABS: 20 /uL — AB (ref 400–2700)
CD4 T CELL HELPER: 2 % — AB (ref 33–55)

## 2016-09-13 LAB — GC/CHLAMYDIA PROBE AMP (~~LOC~~) NOT AT ARMC
CHLAMYDIA, DNA PROBE: NEGATIVE
NEISSERIA GONORRHEA: NEGATIVE

## 2016-09-13 LAB — RPR: RPR: NONREACTIVE

## 2016-09-13 MED ORDER — PIPERACILLIN-TAZOBACTAM 3.375 G IVPB
3.3750 g | Freq: Three times a day (TID) | INTRAVENOUS | Status: DC
  Filled 2016-09-13 (×2): qty 50

## 2016-09-13 MED ORDER — OXYCODONE HCL 5 MG PO TABS
2.5000 mg | ORAL_TABLET | Freq: Four times a day (QID) | ORAL | Status: DC | PRN
  Administered 2016-09-14 – 2016-09-16 (×5): 2.5 mg via ORAL
  Filled 2016-09-13 (×5): qty 1

## 2016-09-13 MED ORDER — PIPERACILLIN-TAZOBACTAM 3.375 G IVPB
3.3750 g | Freq: Three times a day (TID) | INTRAVENOUS | Status: DC
  Administered 2016-09-13 – 2016-09-20 (×22): 3.375 g via INTRAVENOUS
  Filled 2016-09-13 (×24): qty 50

## 2016-09-13 NOTE — Progress Notes (Signed)
PROGRESS NOTE  Justin Ball  ZOX:[id number] DOB: [birth year]  DOA: 09/12/2016 PCP: No primary care provider on file.   Brief Narrative:  25 year old male with history of HIV/AIDS, noncompliant with medications, bipolar disorder, polysubstance abuse, presented to [facility] ED on 09/12/16 with 2 weeks history of sore throat, sinus pressure, eye drainage, painful oral lesions and concern for oral abscess. ENT and ID consult pain.   Assessment & Plan:   Principal Problem:   Ulcer of soft palate Active Problems:   Bipolar disorder (HCC)   H/O noncompliance with medical treatment - finincial, presenting hazards to health   AIDS ([facility])   Acute hypokalemia   Anemia   Alcohol use ([facility])   Oral abscess   Right floor of mouth infection (phlegmon versus abscess) and multiple oral ulcers - Discussed with ENT and ID. As per ENT, IV antibiotics 48 hours and reassess with repeat CT to decide conservative management versus OR for I&D - Patient has been started on IV Zosyn  HIV/AIDS - Continue ART per ID.  Noncompliance - Discussed extensively with patient. He indicates that he does not get time off from his work to keep up physician appointments.  Hypokalemia - Replaced and IV fluids. Follow BMP in a.m.    Bipolar disorder - not on medications pta.  Alcohol use - No withdrawal   DVT prophylaxis: Heparin Code Status: Full Family Communication: None at bedside Disposition Plan: DC home when medically improved   Consultants:   IN  ENT  Procedures:   None  Antimicrobials:   IV Zosyn    Subjective: Moth pain unchanged from yesterday (told ID MD later that it was better)  Objective:  Vitals:   09/12/16 1115 09/12/16 1145 09/12/16 2229 09/13/16 0457  BP: 105/57 122/81 121/72 129/80  Pulse: 82 86 83 91  Resp:  16 18 17   Temp:   98.9 F (37.2 C) 99.4 F (37.4 C)  TempSrc:   Oral Oral  SpO2: 98% 95% 98% 99%  Weight:      Height:        Intake/Output Summary (Last 24  hours) at 09/13/16 0735 Last data filed at 09/13/16 8119  Gross per 24 hour  Intake           2862.5 ml  Output                0 ml  Net           2862.5 ml   Filed Weights   09/11/16 2316  Weight: 67 kg (147 lb 12.8 oz)    Examination:  General exam: Pleasant young male sitting up comfortably in the bed. Does not appear septic or toxic. ENT: 2 superficial ulcers noted on soft palate. Tenderness to palpation on right floor of mouth and? Swelling. No stridor. Respiratory system: Clear to auscultation. Respiratory effort normal. Cardiovascular system: S1 & S2 heard, RRR. No JVD, murmurs, rubs, gallops or clicks. No pedal edema. Gastrointestinal system: Abdomen is nondistended, soft and nontender. No organomegaly or masses felt. Normal bowel sounds heard. Central nervous system: Alert and oriented. No focal neurological deficits. Extremities: Symmetric 5 x 5 power. Skin: No rashes, lesions or ulcers Psychiatry: Judgement and insight appear normal. Mood & affect appropriate.     Data Reviewed: I have personally reviewed following labs and imaging studies  CBC:  Recent Labs Lab 09/12/16 0315  WBC 4.9  NEUTROABS 3.9  HGB 12.0*  HCT 35.7*  MCV 92.7  PLT 261   Basic Metabolic  Panel:  Recent Labs Lab 09/12/16 0315 09/12/16 1251  NA 139  --   K 3.4*  --   CL 107  --   CO2 24  --   GLUCOSE 78  --   BUN 13  --   CREATININE 0.92  --   CALCIUM 8.7*  --   MG  --  2.0  PHOS  --  2.7   GFR: Estimated Creatinine Clearance: 116.3 mL/min (by C-G formula based on SCr of 0.92 mg/dL). Liver Function Tests: No results for input(s): AST, ALT, ALKPHOS, BILITOT, PROT, ALBUMIN in the last 168 hours. No results for input(s): LIPASE, AMYLASE in the last 168 hours. No results for input(s): AMMONIA in the last 168 hours. Coagulation Profile: No results for input(s): INR, PROTIME in the last 168 hours. Cardiac Enzymes: No results for input(s): CKTOTAL, CKMB, CKMBINDEX, TROPONINI in  the last 168 hours. BNP (last 3 results) No results for input(s): PROBNP in the last 8760 hours. HbA1C: No results for input(s): HGBA1C in the last 72 hours. CBG: No results for input(s): GLUCAP in the last 168 hours. Lipid Profile: No results for input(s): CHOL, HDL, LDLCALC, TRIG, CHOLHDL, LDLDIRECT in the last 72 hours. Thyroid Function Tests: No results for input(s): TSH, T4TOTAL, FREET4, T3FREE, THYROIDAB in the last 72 hours. Anemia Panel:  Recent Labs  09/12/16 0821  VITAMINB12 1,277*  FOLATE 5.6*  FERRITIN 1,011*  TIBC 211*  IRON 32*  RETICCTPCT 0.5    Sepsis Labs: No results for input(s): PROCALCITON, LATICACIDVEN in the last 168 hours.  Recent Results (from the past 240 hour(s))  Rapid strep screen     Status: None   Collection Time: 09/11/16 11:25 PM  Result Value Ref Range Status   Streptococcus, Group A Screen (Direct) NEGATIVE NEGATIVE Final    Comment: (NOTE) A Rapid Antigen test may result negative if the antigen level in the sample is below the detection level of this test. The FDA has not cleared this test as a stand-alone test therefore the rapid antigen negative result has reflexed to a Group A Strep culture.          Radiology Studies: Ct Soft Tissue Neck W Contrast  Result Date: 09/12/2016 CLINICAL DATA:  Initial evaluation for ulcerative lesions on hard palate to bone. Throat pain. History of HIV. EXAM: CT NECK WITH CONTRAST TECHNIQUE: Multidetector CT imaging of the neck was performed using the standard protocol following the bolus administration of intravenous contrast. CONTRAST:  75 cc of Isovue-300. COMPARISON:  None available. FINDINGS: Pharynx and larynx: Oral tongue within normal limits without acute abnormality. Circumscribed ovoid hypodense lesion measuring 1.9 x 1.5 x 1.5 cm present at the right floor of mouth (series 2, image 63). No significant rim enhancement or associated inflammatory changes. Overlying lingual aspect of the alveolar  ridge is intact without dehiscence or evidence of significant dental disease within this region. Left floor of mouth without abnormality. Probable ulcerative lesions noted at the junction of the hard and soft palate as described in provided history (series 2, image 40). These extend immediately posterior to the hard palate with extension into the left posterior nasal cavity (series 4, image 47). No associated abscess. Palatine tonsils symmetric and within normal limits. Parapharyngeal fat grossly preserved. Nasopharynx within normal limits. Remainder of the oropharynx within normal limits without significant inflammatory changes. No retropharyngeal fluid collection. Epiglottis is normal. Vallecula is clear. Remainder of the hypopharynx and supraglottic larynx are normal. True cords symmetric and within normal limits. Subglottic  airway is clear. Salivary glands: Parotid glands and submandibular glands are within normal limits. No stones identified. Thyroid: Thyroid gland normal. Lymph nodes: Shotty sub cm cervical lymph nodes present within the neck bilaterally. No pathologically enlarged lymph nodes identified. Vascular: Normal intravascular enhancement seen throughout the neck. Limited intracranial: Visualized portions of the brain are unremarkable. Visualized orbits: Globes and orbits within normal limits. Mastoids and visualized paranasal sinuses: Mastoid air cells are clear. Middle ear cavities are well pneumatized. Mucosal thickening present within the bilateral maxillary sinuses, left greater than right. Mucosal thickening with opacity within the sphenoid sinuses bilaterally, also worse on the left. Mild left-sided ethmoidal sinus disease. Frontal sinuses are clear. Skeleton: No acute osseous abnormality. No worrisome lytic or blastic osseous lesions. Upper chest: Scattered nodular tree-in-bud with ground-glass opacities within the partially visualized lungs, suspicious for possible endobronchial infection.  IMPRESSION: 1. At least 2 adjacent ulcerative lesions involving the central and left soft palate, with extension into the left nasal cavity as above. Hard palate itself remains intact. 2. 1.9 x 1.5 x 1.5 cm hypodensity within the right floor of mouth, suspicious for possible phlegmon/abscess. Correlation with physical exam recommended. 3. Nodular and ground-glass opacities within the partially visualized lungs, suspicious for possible acute endobronchial infection/small airways disease. 4. Mild to moderate paranasal sinus disease as above, worse on the left. Electronically Signed   By: Rise MuBenjamin  McClintock M.D.   On: 09/12/2016 05:25        Scheduled Meds: . darunavir-cobicistat  1 tablet Oral Daily  . dolutegravir  50 mg Oral Daily  . emtricitabine-tenofovir AF  1 tablet Oral Daily  . feeding supplement (ENSURE ENLIVE)  237 mL Oral BID BM  . folic acid  1 mg Oral Daily  . heparin  5,000 Units Subcutaneous Q8H  . ketorolac  15 mg Intravenous Q6H  . LORazepam  0-4 mg Intravenous Q6H   Followed by  . [START ON 09/14/2016] LORazepam  0-4 mg Intravenous Q12H  . multivitamin with minerals  1 tablet Oral Daily  . thiamine  100 mg Oral Daily   Or  . thiamine  100 mg Intravenous Daily   Continuous Infusions: . 0.9 % NaCl with KCl 20 mEq / L 125 mL/hr at 09/13/16 0614     LOS: 1 day    Time spent: 25 minutes.    College Park Surgery Center LLCNGALGI,Daena Alper, MD [facility] Pager [phone]  If 7PM-7AM, please contact night-coverage [email or web address] Password [email or web address] 09/13/2016, 7:35 AM

## 2016-09-13 NOTE — Progress Notes (Signed)
Subjective: Still has some mouth and throat pain, slightly better than yesterday.  Objective: Vital signs in last 24 hours: Temp:  [98.9 F (37.2 C)-99.4 F (37.4 C)] 99.4 F (37.4 C) (09/20 0457) Pulse Rate:  [66-91] 91 (09/20 0457) Resp:  [14-18] 17 (09/20 0457) BP: (105-129)/(57-86) 129/80 (09/20 0457) SpO2:  [95 %-100 %] 99 % (09/20 0457)  Physical Exam Constitutional: He appears well-developedand well-nourished. No distress.  Head: Normocephalicand atraumatic.  Mouth/Throat: Uvula is midlineand oropharynx is clear and moist. No trismus. Patient managing oral secretions. Two ulcerations noted on palate - left ulceration is deeper. The right floor of mouth is mildly tender to palpation. Eyes: Conjunctivaeand EOMare normal. Pupils are equal, round, and reactive to light. Right eye exhibits no discharge. Left eye exhibits no discharge. No scleral icterus.  Neck: Normal range of motionand phonation normal. Neck supple. No neck rigidity. Normal range of motionpresent.  No stridor. No nuchal rigidity.  Cardiovascular: Normal rate, regular rhythm, normal heart soundsand intact distal pulses.  Pulmonary/Chest: Effort normaland breath sounds normal. No stridor. No respiratory distress. He has no wheezes.  Musculoskeletal: Normal range of motion.  Lymphadenopathy: He has multiple cervical adenopathy.  Neurological: He is alert and oriented x3. Skin: Skin is warmand dry. He is not diaphoretic.  Psychiatric: He has a normal mood and affect. His behavior is normal.   Recent Labs  09/12/16 0315  WBC 4.9  HGB 12.0*  HCT 35.7*  PLT 261    Recent Labs  09/12/16 0315  NA 139  K 3.4*  CL 107  CO2 24  GLUCOSE 78  BUN 13  CREATININE 0.92  CALCIUM 8.7*    Medications:  I have reviewed the patient's current medications. Scheduled: . darunavir-cobicistat  1 tablet Oral Daily  . dolutegravir  50 mg Oral Daily  . emtricitabine-tenofovir AF  1 tablet Oral Daily  .  feeding supplement (ENSURE ENLIVE)  237 mL Oral BID BM  . folic acid  1 mg Oral Daily  . heparin  5,000 Units Subcutaneous Q8H  . ketorolac  15 mg Intravenous Q6H  . LORazepam  0-4 mg Intravenous Q6H   Followed by  . [START ON 09/14/2016] LORazepam  0-4 mg Intravenous Q12H  . multivitamin with minerals  1 tablet Oral Daily  . thiamine  100 mg Oral Daily   Or  . thiamine  100 mg Intravenous Daily   UJW:[id number]:acetaminophen **OR** acetaminophen, HYDROmorphone (DILAUDID) injection, LORazepam **OR** LORazepam  Assessment/Plan: Pt with right floor of mouth infection (phlegmon vs abscess) and multiple oral ulcers. Please start pt on IV abx (e.g. Clindamycin).  Repeat CT scan in 48 hours to assess the need for OR I&D. Will follow.   LOS: 1 day   Kadeisha Betsch,SUI W 09/13/2016, 7:59 AM

## 2016-09-13 NOTE — Progress Notes (Signed)
INFECTIOUS DISEASE PROGRESS NOTE  ID: Justin Ball is a 25 y.o. male with  Principal Problem:   Ulcer of soft palate Active Problems:   Bipolar disorder (HCC)   H/O noncompliance with medical treatment - finincial, presenting hazards to health   AIDS (HCC)   Acute hypokalemia   Anemia   Alcohol use (HCC)   Oral abscess  Subjective: Feels like oral swelling is better. Would like more solid food (currently liquids).   Abtx:  Anti-infectives    Start     Dose/Rate Route Frequency Ordered Stop   09/13/16 1200  piperacillin-tazobactam (ZOSYN) IVPB 3.375 g  Status:  Discontinued     3.375 g 12.5 mL/hr over 240 Minutes Intravenous Every 8 hours 09/13/16 1053 09/13/16 1055   09/13/16 1200  piperacillin-tazobactam (ZOSYN) IVPB 3.375 g     3.375 g 12.5 mL/hr over 240 Minutes Intravenous Every 8 hours 09/13/16 1055     09/13/16 0800  Darunavir Ethanolate (PREZISTA) tablet 800 mg  Status:  Discontinued     800 mg Oral Daily with breakfast 09/12/16 0938 09/12/16 0939   09/13/16 0800  ritonavir ([name]) tablet 100 mg  Status:  Discontinued     100 mg Oral Daily with breakfast 09/12/16 0938 09/12/16 0939   09/12/16 1400  clindamycin (CLEOCIN) IVPB 600 mg  Status:  Discontinued     600 mg 100 mL/hr over 30 Minutes Intravenous Every 8 hours 09/12/16 0748 09/12/16 0926   09/12/16 1030  emtricitabine-tenofovir AF (DESCOVY) 200-25 MG per tablet 1 tablet     1 tablet Oral Daily 09/12/16 0938     09/12/16 1030  dolutegravir (TIVICAY) tablet 50 mg     50 mg Oral Daily 09/12/16 0938     09/12/16 1030  darunavir-cobicistat (PREZCOBIX) 800-150 MG per tablet 1 tablet     1 tablet Oral Daily 09/12/16 0939     09/12/16 0545  clindamycin (CLEOCIN) IVPB 600 mg     600 mg 100 mL/hr over 30 Minutes Intravenous  Once 09/12/16 0544 09/12/16 0820      Medications:  Scheduled: . darunavir-cobicistat  1 tablet Oral Daily  . dolutegravir  50 mg Oral Daily  . emtricitabine-tenofovir AF  1 tablet Oral  Daily  . feeding supplement (ENSURE ENLIVE)  237 mL Oral BID BM  . folic acid  1 mg Oral Daily  . heparin  5,000 Units Subcutaneous Q8H  . ketorolac  15 mg Intravenous Q6H  . LORazepam  0-4 mg Intravenous Q6H   Followed by  . [START ON 09/14/2016] LORazepam  0-4 mg Intravenous Q12H  . multivitamin with minerals  1 tablet Oral Daily  . piperacillin-tazobactam (ZOSYN)  IV  3.375 g Intravenous Q8H  . thiamine  100 mg Oral Daily   Or  . thiamine  100 mg Intravenous Daily    Objective: Vital signs in last 24 hours: Temp:  [98.5 F (36.9 C)-99.4 F (37.4 C)] 98.5 F (36.9 C) (09/20 1408) Pulse Rate:  [72-91] 72 (09/20 1408) Resp:  [17-18] 17 (09/20 1408) BP: (121-129)/(72-82) 128/82 (09/20 1408) SpO2:  [98 %-99 %] 98 % (09/20 1408)   General appearance: alert, cooperative and no distress Throat: palatal ulcers unchanged.  Resp: clear to auscultation bilaterally Cardio: regular rate and rhythm GI: normal findings: bowel sounds normal and soft, non-tender  Lab Results  Recent Labs  09/12/16 0315  WBC 4.9  HGB 12.0*  HCT 35.7*  NA 139  K 3.4*  CL 107  CO2 24  BUN 13  CREATININE 0.92   Liver Panel No results for input(s): PROT, ALBUMIN, AST, ALT, ALKPHOS, BILITOT, BILIDIR, IBILI in the last 72 hours. Sedimentation Rate No results for input(s): ESRSEDRATE in the last 72 hours. C-Reactive Protein No results for input(s): CRP in the last 72 hours.  Microbiology: Recent Results (from the past 240 hour(s))  Rapid strep screen     Status: None   Collection Time: 09/11/16 11:25 PM  Result Value Ref Range Status   Streptococcus, Group A Screen (Direct) NEGATIVE NEGATIVE Final    Comment: (NOTE) A Rapid Antigen test may result negative if the antigen level in the sample is below the detection level of this test. The FDA has not cleared this test as a stand-alone test therefore the rapid antigen negative result has reflexed to a Group A Strep culture.   Culture, group A  strep     Status: None (Preliminary result)   Collection Time: 09/11/16 11:25 PM  Result Value Ref Range Status   Specimen Description THROAT  Final   Special Requests NONE Reflexed from [id number]  Final   Culture CULTURE REINCUBATED FOR BETTER GROWTH  Final   Report Status PENDING  Incomplete    Studies/Results: Ct Soft Tissue Neck W Contrast  Result Date: 09/12/2016 CLINICAL DATA:  Initial evaluation for ulcerative lesions on hard palate to bone. Throat pain. History of HIV. EXAM: CT NECK WITH CONTRAST TECHNIQUE: Multidetector CT imaging of the neck was performed using the standard protocol following the bolus administration of intravenous contrast. CONTRAST:  75 cc of Isovue-300. COMPARISON:  None available. FINDINGS: Pharynx and larynx: Oral tongue within normal limits without acute abnormality. Circumscribed ovoid hypodense lesion measuring 1.9 x 1.5 x 1.5 cm present at the right floor of mouth (series 2, image 63). No significant rim enhancement or associated inflammatory changes. Overlying lingual aspect of the alveolar ridge is intact without dehiscence or evidence of significant dental disease within this region. Left floor of mouth without abnormality. Probable ulcerative lesions noted at the junction of the hard and soft palate as described in provided history (series 2, image 40). These extend immediately posterior to the hard palate with extension into the left posterior nasal cavity (series 4, image 47). No associated abscess. Palatine tonsils symmetric and within normal limits. Parapharyngeal fat grossly preserved. Nasopharynx within normal limits. Remainder of the oropharynx within normal limits without significant inflammatory changes. No retropharyngeal fluid collection. Epiglottis is normal. Vallecula is clear. Remainder of the hypopharynx and supraglottic larynx are normal. True cords symmetric and within normal limits. Subglottic airway is clear. Salivary glands: Parotid glands and  submandibular glands are within normal limits. No stones identified. Thyroid: Thyroid gland normal. Lymph nodes: Shotty sub cm cervical lymph nodes present within the neck bilaterally. No pathologically enlarged lymph nodes identified. Vascular: Normal intravascular enhancement seen throughout the neck. Limited intracranial: Visualized portions of the brain are unremarkable. Visualized orbits: Globes and orbits within normal limits. Mastoids and visualized paranasal sinuses: Mastoid air cells are clear. Middle ear cavities are well pneumatized. Mucosal thickening present within the bilateral maxillary sinuses, left greater than right. Mucosal thickening with opacity within the sphenoid sinuses bilaterally, also worse on the left. Mild left-sided ethmoidal sinus disease. Frontal sinuses are clear. Skeleton: No acute osseous abnormality. No worrisome lytic or blastic osseous lesions. Upper chest: Scattered nodular tree-in-bud with ground-glass opacities within the partially visualized lungs, suspicious for possible endobronchial infection. IMPRESSION: 1. At least 2 adjacent ulcerative lesions involving the central and left soft  palate, with extension into the left nasal cavity as above. Hard palate itself remains intact. 2. 1.9 x 1.5 x 1.5 cm hypodensity within the right floor of mouth, suspicious for possible phlegmon/abscess. Correlation with physical exam recommended. 3. Nodular and ground-glass opacities within the partially visualized lungs, suspicious for possible acute endobronchial infection/small airways disease. 4. Mild to moderate paranasal sinus disease as above, worse on the left. Electronically Signed   By: Rise Mu M.D.   On: 09/12/2016 05:25     Assessment/Plan: Oral ulcers, possible abscess floor of mouth AIDS  Total days of antibiotics: 0 zosyn Prezcobix, descovy, tivicay  Started pt on zosyn (clinda is high risk for C diff).  Await repsonse, possible repeat CT over weekend to  help determine need for debridement.  Await HIV RNA and genotype          Johny Sax Infectious Diseases (pager) [phone] [email or web address] 09/13/2016, 2:39 PM  LOS: 1 day

## 2016-09-14 DIAGNOSIS — D649 Anemia, unspecified: Secondary | ICD-10-CM

## 2016-09-14 LAB — CULTURE, GROUP A STREP (THRC)

## 2016-09-14 LAB — BASIC METABOLIC PANEL
ANION GAP: 8 (ref 5–15)
BUN: <5 mg/dL — ABNORMAL LOW (ref 6–20)
CALCIUM: 8.9 mg/dL (ref 8.9–10.3)
CO2: 24 mmol/L (ref 22–32)
CREATININE: 0.89 mg/dL (ref 0.61–1.24)
Chloride: 105 mmol/L (ref 101–111)
Glucose, Bld: 79 mg/dL (ref 65–99)
Potassium: 3.9 mmol/L (ref 3.5–5.1)
SODIUM: 137 mmol/L (ref 135–145)

## 2016-09-14 LAB — CBC
HCT: 34.5 % — ABNORMAL LOW (ref 39.0–52.0)
HEMOGLOBIN: 11.1 g/dL — AB (ref 13.0–17.0)
MCH: 30.3 pg (ref 26.0–34.0)
MCHC: 32.2 g/dL (ref 30.0–36.0)
MCV: 94.3 fL (ref 78.0–100.0)
PLATELETS: 313 10*3/uL (ref 150–400)
RBC: 3.66 MIL/uL — AB (ref 4.22–5.81)
RDW: 14.1 % (ref 11.5–15.5)
WBC: 4.8 10*3/uL (ref 4.0–10.5)

## 2016-09-14 NOTE — Progress Notes (Signed)
PROGRESS NOTE  Justin Ball  ZOX:[id number] DOB: 05/18/1991  DOA: 09/12/2016 PCP: No primary care provider on file.   Brief Narrative:  25 year old male with history of HIV/AIDS, noncompliant with medications, bipolar disorder, polysubstance abuse, [facility]16 with 2 weeks history of sore throat, sinus pressure, eye drainage, painful oral lesions and concern for oral abscess. ENT and ID consulted.   Assessment & Plan:   Principal Problem:   Ulcer of soft palate Active Problems:   Bipolar disorder (HCC)   H/O noncompliance with medical treatment - finincial, presenting hazards to health   AIDS (HCC)   Acute hypokalemia   Anemia   Alcohol use (HCC)   Oral abscess   Right floor of mouth infection (phlegmon versus abscess) and multiple oral ulcers - Discussed with ENT and ID. As per ENT, IV antibiotics 48 hours and reassess with repeat CT to decide conservative management versus OR for I&D - Patient has been started on IV Zosyn - Improving. Plans for repeat CT in 09/15/16 to determine further course of action.  HIV/AIDS - Continue ART per ID.  Noncompliance - Discussed extensively with patient. He indicates that he does not get time off from his work to keep up physician appointments.  Hypokalemia - Replaced  Bipolar disorder - not on medications pta.  Alcohol use - No withdrawal  Anemia - May be dilutional. Low serum folate >continue at discharge. Follow CBC in a.m.   DVT prophylaxis: Heparin Code Status: Full Family Communication: None at bedside Disposition Plan: DC home when medically improved   Consultants:   ID  ENT  Procedures:   None  Antimicrobials:   IV Zosyn    Subjective: States that his mouth pain is significantly improved and is asking for regular consistency food. No other complaints.  Objective:  Vitals:   09/13/16 1408 09/13/16 2146 09/14/16 0546 09/14/16 0800  BP: 128/82 131/88 120/86 131/86  Pulse: 72 71 67  74  Resp: 17 18 18    Temp: 98.5 F (36.9 C) 98.3 F (36.8 C) 98.3 F (36.8 C)   TempSrc: Oral Oral Oral   SpO2: 98% 99% 100%   Weight:      Height:        Intake/Output Summary (Last 24 hours) at 09/14/16 1127 Last data filed at 09/14/16 [id number]  Gross per 24 hour  Intake          2035.42 ml  Output                0 ml  Net          2035.42 ml   Filed Weights   09/11/16 2316  Weight: 67 kg (147 lb 12.8 oz)    Examination:  General exam: Pleasant young male sitting up comfortably in the bed. Does not appear septic or toxic. ENT: 2 superficial ulcers noted on soft palate. Tenderness to palpation on right floor of mouth and? Swelling. No stridor. Respiratory system: Clear to auscultation. Respiratory effort normal. Cardiovascular system: S1 & S2 heard, RRR. No JVD, murmurs, rubs, gallops or clicks. No pedal edema. Gastrointestinal system: Abdomen is nondistended, soft and nontender. No organomegaly or masses felt. Normal bowel sounds heard. Central nervous system: Alert and oriented. No focal neurological deficits. Extremities: Symmetric 5 x 5 power. Skin: No rashes, lesions or ulcers Psychiatry: Judgement and insight appear normal. Mood & affect appropriate.     Data Reviewed: I have personally reviewed following labs and imaging studies  CBC:  Recent  Labs Lab 09/12/16 0315 09/14/16 0451  WBC 4.9 4.8  NEUTROABS 3.9  --   HGB 12.0* 11.1*  HCT 35.7* 34.5*  MCV 92.7 94.3  PLT 261 313   Basic Metabolic Panel:  Recent Labs Lab 09/12/16 0315 09/12/16 1251 09/14/16 0451  NA 139  --  137  K 3.4*  --  3.9  CL 107  --  105  CO2 24  --  24  GLUCOSE 78  --  79  BUN 13  --  <5*  CREATININE 0.92  --  0.89  CALCIUM 8.7*  --  8.9  MG  --  2.0  --   PHOS  --  2.7  --    GFR: Estimated Creatinine Clearance: 120.2 mL/min (by C-G formula based on SCr of 0.89 mg/dL). Liver Function Tests: No results for input(s): AST, ALT, ALKPHOS, BILITOT, PROT, ALBUMIN in the last  168 hours. No results for input(s): LIPASE, AMYLASE in the last 168 hours. No results for input(s): AMMONIA in the last 168 hours. Coagulation Profile: No results for input(s): INR, PROTIME in the last 168 hours. Cardiac Enzymes: No results for input(s): CKTOTAL, CKMB, CKMBINDEX, TROPONINI in the last 168 hours. BNP (last 3 results) No results for input(s): PROBNP in the last 8760 hours. HbA1C: No results for input(s): HGBA1C in the last 72 hours. CBG: No results for input(s): GLUCAP in the last 168 hours. Lipid Profile: No results for input(s): CHOL, HDL, LDLCALC, TRIG, CHOLHDL, LDLDIRECT in the last 72 hours. Thyroid Function Tests: No results for input(s): TSH, T4TOTAL, FREET4, T3FREE, THYROIDAB in the last 72 hours. Anemia Panel:  Recent Labs  09/12/16 0821  VITAMINB12 1,277*  FOLATE 5.6*  FERRITIN 1,011*  TIBC 211*  IRON 32*  RETICCTPCT 0.5    Sepsis Labs: No results for input(s): PROCALCITON, LATICACIDVEN in the last 168 hours.  Recent Results (from the past 240 hour(s))  Rapid strep screen     Status: None   Collection Time: 09/11/16 11:25 PM  Result Value Ref Range Status   Streptococcus, Group A Screen (Direct) NEGATIVE NEGATIVE Final    Comment: (NOTE) A Rapid Antigen test may result negative if the antigen level in the sample is below the detection level of this test. The FDA has not cleared this test as a stand-alone test therefore the rapid antigen negative result has reflexed to a Group A Strep culture.   Culture, group A strep     Status: None   Collection Time: 09/11/16 11:25 PM  Result Value Ref Range Status   Specimen Description THROAT  Final   Special Requests NONE Reflexed from [id number]  Final   Culture NO GROUP A STREP (S.PYOGENES) ISOLATED  Final   Report Status 09/14/2016 FINAL  Final         Radiology Studies: No results found.      Scheduled Meds: . darunavir-cobicistat  1 tablet Oral Daily  . dolutegravir  50 mg Oral Daily  .  emtricitabine-tenofovir AF  1 tablet Oral Daily  . feeding supplement (ENSURE ENLIVE)  237 mL Oral BID BM  . folic acid  1 mg Oral Daily  . heparin  5,000 Units Subcutaneous Q8H  . ketorolac  15 mg Intravenous Q6H  . LORazepam  0-4 mg Intravenous Q12H  . multivitamin with minerals  1 tablet Oral Daily  . piperacillin-tazobactam (ZOSYN)  IV  3.375 g Intravenous Q8H  . thiamine  100 mg Oral Daily   Or  . thiamine  100 mg  Intravenous Daily   Continuous Infusions:     LOS: 2 days    Time spent: 25 minutes.    [facility], MD [facility] Pager [phone]  If 7PM-7AM, please contact night-coverage [email or web address] Password [facility] 09/14/2016, 11:27 AM

## 2016-09-14 NOTE — Progress Notes (Signed)
Subjective: The patient reports improvement in his mouth and throat pain.  Objective: Vital signs in last 24 hours: Temp:  [98.3 F (36.8 C)-98.5 F (36.9 C)] 98.3 F (36.8 C) (09/21 0546) Pulse Rate:  [67-72] 67 (09/21 0546) Resp:  [17-18] 18 (09/21 0546) BP: (120-131)/(82-88) 120/86 (09/21 0546) SpO2:  [98 %-100 %] 100 % (09/21 0546)  Physical Exam Constitutional: He appears well-developedand well-nourished. No distress.  Head: Normocephalicand atraumatic.  Mouth/Throat: Uvula is midlineand oropharynx is clear and moist. No trismus. Patient managing oral secretions. Two ulcerations noted on palate - left ulceration isdeeper. The right floor of mouth is mildly tender to palpation. Eyes: Conjunctivaeand EOMare normal. Pupils are equal, round, and reactive to light. Right eye exhibits no discharge. Left eye exhibits no discharge. No scleral icterus.  Neck: Normal range of motionand phonation normal. Neck supple. No neck rigidity. Normal range of motionpresent.  No stridor. No nuchal rigidity.  Cardiovascular: Normal rate, regular rhythm, normal heart soundsand intact distal pulses.  Pulmonary/Chest: Effort normaland breath sounds normal. No stridor. No respiratory distress. He has no wheezes.  Musculoskeletal: Normal range of motion.  Lymphadenopathy: He has multiple cervical adenopathy.  Neurological: He is alert and oriented x3. Skin: Skin is warmand dry. He is not diaphoretic.  Psychiatric: He has a normal mood and affect. His behavior is normal.    Recent Labs  09/12/16 0315 09/14/16 0451  WBC 4.9 4.8  HGB 12.0* 11.1*  HCT 35.7* 34.5*  PLT 261 313    Recent Labs  09/12/16 0315 09/14/16 0451  NA 139 137  K 3.4* 3.9  CL 107 105  CO2 24 24  GLUCOSE 78 79  BUN 13 <5*  CREATININE 0.92 0.89  CALCIUM 8.7* 8.9    Medications:  I have reviewed the patient's current medications. Scheduled: . darunavir-cobicistat  1 tablet Oral Daily  . dolutegravir   50 mg Oral Daily  . emtricitabine-tenofovir AF  1 tablet Oral Daily  . feeding supplement (ENSURE ENLIVE)  237 mL Oral BID BM  . folic acid  1 mg Oral Daily  . heparin  5,000 Units Subcutaneous Q8H  . ketorolac  15 mg Intravenous Q6H  . LORazepam  0-4 mg Intravenous Q6H   Followed by  . LORazepam  0-4 mg Intravenous Q12H  . multivitamin with minerals  1 tablet Oral Daily  . piperacillin-tazobactam (ZOSYN)  IV  3.375 g Intravenous Q8H  . thiamine  100 mg Oral Daily   Or  . thiamine  100 mg Intravenous Daily   EAV:[id number]:acetaminophen **OR** acetaminophen, HYDROmorphone (DILAUDID) injection, LORazepam **OR** LORazepam, oxyCODONE  Assessment/Plan: Pt with right floor of mouth infection (phlegmon vs abscess) and multiple oral ulcers. Pt started on IV zosyn yesterday.  Repeat CT scan tomorrow to determine the need for I&D. Will follow.   LOS: 2 days   Kalmen Lollar,SUI W 09/14/2016, 7:12 AM

## 2016-09-14 NOTE — Progress Notes (Signed)
INFECTIOUS DISEASE PROGRESS NOTE  ID: Justin Ball is a 25 y.o. male with  Principal Problem:   Ulcer of soft palate Active Problems:   Bipolar disorder (HCC)   H/O noncompliance with medical treatment - finincial, presenting hazards to health   AIDS (HCC)   Acute hypokalemia   Anemia   Alcohol use (HCC)   Oral abscess  Subjective: Pain down to 5/10  Abtx:  Anti-infectives    Start     Dose/Rate Route Frequency Ordered Stop   09/13/16 1200  piperacillin-tazobactam (ZOSYN) IVPB 3.375 g  Status:  Discontinued     3.375 g 12.5 mL/hr over 240 Minutes Intravenous Every 8 hours 09/13/16 1053 09/13/16 1055   09/13/16 1200  piperacillin-tazobactam (ZOSYN) IVPB 3.375 g     3.375 g 12.5 mL/hr over 240 Minutes Intravenous Every 8 hours 09/13/16 1055     09/13/16 0800  Darunavir Ethanolate (PREZISTA) tablet 800 mg  Status:  Discontinued     800 mg Oral Daily with breakfast 09/12/16 0938 09/12/16 0939   09/13/16 0800  ritonavir ([name]) tablet 100 mg  Status:  Discontinued     100 mg Oral Daily with breakfast 09/12/16 0938 09/12/16 0939   09/12/16 1400  clindamycin (CLEOCIN) IVPB 600 mg  Status:  Discontinued     600 mg 100 mL/hr over 30 Minutes Intravenous Every 8 hours 09/12/16 0748 09/12/16 0926   09/12/16 1030  emtricitabine-tenofovir AF (DESCOVY) 200-25 MG per tablet 1 tablet     1 tablet Oral Daily 09/12/16 0938     09/12/16 1030  dolutegravir (TIVICAY) tablet 50 mg     50 mg Oral Daily 09/12/16 0938     09/12/16 1030  darunavir-cobicistat (PREZCOBIX) 800-150 MG per tablet 1 tablet     1 tablet Oral Daily 09/12/16 0939     09/12/16 0545  clindamycin (CLEOCIN) IVPB 600 mg     600 mg 100 mL/hr over 30 Minutes Intravenous  Once 09/12/16 0544 09/12/16 0820      Medications:  Scheduled: . darunavir-cobicistat  1 tablet Oral Daily  . dolutegravir  50 mg Oral Daily  . emtricitabine-tenofovir AF  1 tablet Oral Daily  . feeding supplement (ENSURE ENLIVE)  237 mL Oral BID BM    . folic acid  1 mg Oral Daily  . heparin  5,000 Units Subcutaneous Q8H  . ketorolac  15 mg Intravenous Q6H  . LORazepam  0-4 mg Intravenous Q12H  . multivitamin with minerals  1 tablet Oral Daily  . piperacillin-tazobactam (ZOSYN)  IV  3.375 g Intravenous Q8H  . thiamine  100 mg Oral Daily    Objective: Vital signs in last 24 hours: Temp:  [98.3 F (36.8 C)-98.6 F (37 C)] 98.6 F (37 C) (09/21 1343) Pulse Rate:  [67-74] 74 (09/21 1343) Resp:  [16-18] 16 (09/21 1343) BP: (120-131)/(79-88) 124/79 (09/21 1343) SpO2:  [98 %-100 %] 98 % (09/21 1343)   General appearance: alert, cooperative and no distress Throat: oral ulcers unchanged.  Neck: submental tenderness.   Lab Results  Recent Labs  09/12/16 0315 09/14/16 0451  WBC 4.9 4.8  HGB 12.0* 11.1*  HCT 35.7* 34.5*  NA 139 137  K 3.4* 3.9  CL 107 105  CO2 24 24  BUN 13 <5*  CREATININE 0.92 0.89   Liver Panel No results for input(s): PROT, ALBUMIN, AST, ALT, ALKPHOS, BILITOT, BILIDIR, IBILI in the last 72 hours. Sedimentation Rate No results for input(s): ESRSEDRATE in the last 72 hours. C-Reactive  Protein No results for input(s): CRP in the last 72 hours.  Microbiology: Recent Results (from the past 240 hour(s))  Rapid strep screen     Status: None   Collection Time: 09/11/16 11:25 PM  Result Value Ref Range Status   Streptococcus, Group A Screen (Direct) NEGATIVE NEGATIVE Final    Comment: (NOTE) A Rapid Antigen test may result negative if the antigen level in the sample is below the detection level of this test. The FDA has not cleared this test as a stand-alone test therefore the rapid antigen negative result has reflexed to a Group A Strep culture.   Culture, group A strep     Status: None   Collection Time: 09/11/16 11:25 PM  Result Value Ref Range Status   Specimen Description THROAT  Final   Special Requests NONE Reflexed from [id number]  Final   Culture NO GROUP A STREP (S.PYOGENES) ISOLATED  Final    Report Status 09/14/2016 FINAL  Final    Studies/Results: No results found.   Assessment/Plan: Oral ulcers, possible abscess floor of mouth AIDS  Total days of antibiotics: 1 zosyn Prezcobix, descovy, tivicay  For f/u CT in AM States he is taking his ART (missing DRVc per notes this AM) No change in anbx for now.          Johny SaxJeffrey Lea Baine Infectious Diseases (pager) [phone] [email or web address] 09/14/2016, 5:10 PM  LOS: 2 days

## 2016-09-15 ENCOUNTER — Inpatient Hospital Stay (HOSPITAL_COMMUNITY): Payer: Self-pay

## 2016-09-15 ENCOUNTER — Encounter (HOSPITAL_COMMUNITY): Payer: Self-pay | Admitting: Radiology

## 2016-09-15 LAB — CBC
HEMATOCRIT: 34 % — AB (ref 39.0–52.0)
HEMOGLOBIN: 11.4 g/dL — AB (ref 13.0–17.0)
MCH: 30.8 pg (ref 26.0–34.0)
MCHC: 33.5 g/dL (ref 30.0–36.0)
MCV: 91.9 fL (ref 78.0–100.0)
Platelets: 345 10*3/uL (ref 150–400)
RBC: 3.7 MIL/uL — ABNORMAL LOW (ref 4.22–5.81)
RDW: 13.8 % (ref 11.5–15.5)
WBC: 3.5 10*3/uL — ABNORMAL LOW (ref 4.0–10.5)

## 2016-09-15 MED ORDER — IOPAMIDOL (ISOVUE-300) INJECTION 61%
INTRAVENOUS | Status: AC
  Administered 2016-09-15: 75 mL
  Filled 2016-09-15: qty 75

## 2016-09-15 MED ORDER — HYDROMORPHONE HCL 1 MG/ML IJ SOLN
0.5000 mg | INTRAMUSCULAR | Status: DC | PRN
  Administered 2016-09-15 – 2016-09-16 (×3): 0.5 mg via INTRAVENOUS
  Filled 2016-09-15 (×3): qty 1

## 2016-09-15 NOTE — Progress Notes (Signed)
Subjective: Still has throat and mouth pain. Tolerating oral intake well.   Objective: Vital signs in last 24 hours: Temp:  [98.1 F (36.7 C)-98.3 F (36.8 C)] 98.1 F (36.7 C) (09/22 1222) Pulse Rate:  [62-70] 70 (09/22 1312) Resp:  [18] 18 (09/22 1222) BP: (98-112)/(48-71) 112/56 (09/22 1312) SpO2:  [100 %] 100 % (09/22 1222)  Physical Exam Constitutional: He appears well-developedand well-nourished. No distress.  Head: Normocephalicand atraumatic.  Mouth/Throat: Uvula is midlineand oropharynx is clear and moist. No trismus. Patient managing oral secretions. Two ulcerations noted on palate - left ulceration isdeeper. The right floor of mouth istender to palpation. Eyes: Conjunctivaeand EOMare normal. Pupils are equal, round, and reactive to light. Right eye exhibits no discharge. Left eye exhibits no discharge. No scleral icterus.  Neck: Normal range of motionand phonation normal. Neck supple. No neck rigidity. Normal range of motionpresent.  No stridor. No nuchal rigidity.  Cardiovascular: Normal rate, regular rhythm, normal heart soundsand intact distal pulses.  Pulmonary/Chest: Effort normaland breath sounds normal. No stridor. No respiratory distress. He has no wheezes.  Musculoskeletal: Normal range of motion.  Lymphadenopathy: He has multiple cervical adenopathy.  Neurological: He is alert and oriented x3. Skin: Skin is warmand dry. He is not diaphoretic.  Psychiatric: He has a normal mood and affect. His behavior is normal.    Recent Labs  09/14/16 0451 09/15/16 0618  WBC 4.8 3.5*  HGB 11.1* 11.4*  HCT 34.5* 34.0*  PLT 313 345    Recent Labs  09/14/16 0451  NA 137  K 3.9  CL 105  CO2 24  GLUCOSE 79  BUN <5*  CREATININE 0.89  CALCIUM 8.9    Medications:  I have reviewed the patient's current medications. Scheduled: . darunavir-cobicistat  1 tablet Oral Daily  . dolutegravir  50 mg Oral Daily  . emtricitabine-tenofovir AF  1 tablet Oral  Daily  . feeding supplement (ENSURE ENLIVE)  237 mL Oral BID BM  . folic acid  1 mg Oral Daily  . heparin  5,000 Units Subcutaneous Q8H  . ketorolac  15 mg Intravenous Q6H  . LORazepam  0-4 mg Intravenous Q12H  . multivitamin with minerals  1 tablet Oral Daily  . piperacillin-tazobactam (ZOSYN)  IV  3.375 g Intravenous Q8H  . thiamine  100 mg Oral Daily   ZOX:[id number]:acetaminophen **OR** [DISCONTINUED] acetaminophen, HYDROmorphone (DILAUDID) injection, oxyCODONE  CT shows worsening of right FOM abscess and possible left parapharyngeal abscess.  Assessment/Plan: Patient with worsening right floor of mouth abscess and possible left parapharyngeal abscess. Will proceed with OR I&D tomorrow. Will also biopsy the oral ulcers. NPO after midnight.   LOS: 3 days   Nicko Daher,SUI W 09/15/2016, 10:15 PM

## 2016-09-15 NOTE — Progress Notes (Signed)
PROGRESS NOTE  Justin SpurrLinston R Ball  UJW:[id number] DOB: 04/12/1991  DOA: 09/12/2016 PCP: No primary care provider on file.   Brief Narrative:  25 year old male with history of HIV/AIDS, noncompliant with medications, bipolar disorder, polysubstance abuse, [facility]16 with 2 weeks history of sore throat, sinus pressure, eye drainage, painful oral lesions and concern for oral abscess. ENT and ID consulted.   Assessment & Plan:   Principal Problem:   Ulcer of soft palate Active Problems:   Bipolar disorder (HCC)   H/O noncompliance with medical treatment - finincial, presenting hazards to health   AIDS (HCC)   Acute hypokalemia   Anemia   Alcohol use (HCC)   Oral abscess   Right floor of mouth infection (phlegmon versus abscess) and multiple oral ulcers - Discussed with ENT and ID. As per ENT, IV antibiotics 48 hours and reassess with repeat CT to decide conservative management versus OR for I&D - Patient was started on IV Zosyn and has completed approximately 48 hours course - Improving. Plans for repeat CT today (ordered) to determine further course of action.  HIV/AIDS - Continue ART per ID. - CD4: 20.  Noncompliance - Discussed extensively with patient. He indicates that he does not get time off from his work to keep up physician appointments.  Hypokalemia - Replaced  Bipolar disorder - not on medications pta.  Alcohol use - No withdrawal  Anemia - May be dilutional. Low serum folate >continue at discharge. Stable.    DVT prophylaxis: Heparin Code Status: Full Family Communication: None at bedside Disposition Plan: DC home when medically improved   Consultants:   ID  ENT  Procedures:   None  Antimicrobials:   IV Zosyn    Subjective: Able to tolerate solid food. Source on top of the mouth without much pain. States that the swelling on the right side of floor of mouth seems to be returning.  Objective:  Vitals:   09/14/16 2141  09/15/16 0458 09/15/16 1222 09/15/16 1312  BP: 118/76 109/71 (!) 98/48 (!) 112/56  Pulse: 68 68 62 70  Resp: 18 18 18    Temp: 98.6 F (37 C) 98.3 F (36.8 C) 98.1 F (36.7 C)   TempSrc: Oral Oral Oral   SpO2: 99% 100% 100%   Weight:      Height:       No intake or output data in the 24 hours ending 09/15/16 1500 Filed Weights   09/11/16 2316  Weight: 67 kg (147 lb 12.8 oz)    Examination:  General exam: Pleasant young male sitting up comfortably in the bed. Does not appear septic or toxic. ENT: 2 superficial ulcers noted on soft palate. Tenderness to palpation on right floor of mouth and? Swelling. No stridor. Respiratory system: Clear to auscultation. Respiratory effort normal. Cardiovascular system: S1 & S2 heard, RRR. No JVD, murmurs, rubs, gallops or clicks. No pedal edema. Gastrointestinal system: Abdomen is nondistended, soft and nontender. No organomegaly or masses felt. Normal bowel sounds heard. Central nervous system: Alert and oriented. No focal neurological deficits. Extremities: Symmetric 5 x 5 power. Skin: No rashes, lesions or ulcers Psychiatry: Judgement and insight appear normal. Mood & affect appropriate.     Data Reviewed: I have personally reviewed following labs and imaging studies  CBC:  Recent Labs Lab 09/12/16 0315 09/14/16 0451 09/15/16 0618  WBC 4.9 4.8 3.5*  NEUTROABS 3.9  --   --   HGB 12.0* 11.1* 11.4*  HCT 35.7* 34.5* 34.0*  MCV  92.7 94.3 91.9  PLT 261 313 345   Basic Metabolic Panel:  Recent Labs Lab 09/12/16 0315 09/12/16 1251 09/14/16 0451  NA 139  --  137  K 3.4*  --  3.9  CL 107  --  105  CO2 24  --  24  GLUCOSE 78  --  79  BUN 13  --  <5*  CREATININE 0.92  --  0.89  CALCIUM 8.7*  --  8.9  MG  --  2.0  --   PHOS  --  2.7  --    GFR: Estimated Creatinine Clearance: 120.2 mL/min (by C-G formula based on SCr of 0.89 mg/dL). Liver Function Tests: No results for input(s): AST, ALT, ALKPHOS, BILITOT, PROT, ALBUMIN in  the last 168 hours. No results for input(s): LIPASE, AMYLASE in the last 168 hours. No results for input(s): AMMONIA in the last 168 hours. Coagulation Profile: No results for input(s): INR, PROTIME in the last 168 hours. Cardiac Enzymes: No results for input(s): CKTOTAL, CKMB, CKMBINDEX, TROPONINI in the last 168 hours. BNP (last 3 results) No results for input(s): PROBNP in the last 8760 hours. HbA1C: No results for input(s): HGBA1C in the last 72 hours. CBG: No results for input(s): GLUCAP in the last 168 hours. Lipid Profile: No results for input(s): CHOL, HDL, LDLCALC, TRIG, CHOLHDL, LDLDIRECT in the last 72 hours. Thyroid Function Tests: No results for input(s): TSH, T4TOTAL, FREET4, T3FREE, THYROIDAB in the last 72 hours. Anemia Panel: No results for input(s): VITAMINB12, FOLATE, FERRITIN, TIBC, IRON, RETICCTPCT in the last 72 hours.  Sepsis Labs: No results for input(s): PROCALCITON, LATICACIDVEN in the last 168 hours.  Recent Results (from the past 240 hour(s))  Rapid strep screen     Status: None   Collection Time: 09/11/16 11:25 PM  Result Value Ref Range Status   Streptococcus, Group A Screen (Direct) NEGATIVE NEGATIVE Final    Comment: (NOTE) A Rapid Antigen test may result negative if the antigen level in the sample is below the detection level of this test. The FDA has not cleared this test as a stand-alone test therefore the rapid antigen negative result has reflexed to a Group A Strep culture.   Culture, group A strep     Status: None   Collection Time: 09/11/16 11:25 PM  Result Value Ref Range Status   Specimen Description THROAT  Final   Special Requests NONE Reflexed from [id number]  Final   Culture NO GROUP A STREP (S.PYOGENES) ISOLATED  Final   Report Status 09/14/2016 FINAL  Final         Radiology Studies: No results found.      Scheduled Meds: . darunavir-cobicistat  1 tablet Oral Daily  . dolutegravir  50 mg Oral Daily  .  emtricitabine-tenofovir AF  1 tablet Oral Daily  . feeding supplement (ENSURE ENLIVE)  237 mL Oral BID BM  . folic acid  1 mg Oral Daily  . heparin  5,000 Units Subcutaneous Q8H  . ketorolac  15 mg Intravenous Q6H  . LORazepam  0-4 mg Intravenous Q12H  . multivitamin with minerals  1 tablet Oral Daily  . piperacillin-tazobactam (ZOSYN)  IV  3.375 g Intravenous Q8H  . thiamine  100 mg Oral Daily   Continuous Infusions:     LOS: 3 days    Time spent: 25 minutes.    [facility], MD [facility] Pager [phone]  If 7PM-7AM, please contact night-coverage [email or web address] Password [email or web address] 09/15/2016, 3:00 PM

## 2016-09-16 ENCOUNTER — Inpatient Hospital Stay (HOSPITAL_COMMUNITY): Payer: Self-pay | Admitting: Anesthesiology

## 2016-09-16 ENCOUNTER — Encounter (HOSPITAL_COMMUNITY)
Admission: EM | Disposition: A | Discharge status: Home / Self Care | Payer: Self-pay | Source: Home / Self Care | Attending: Internal Medicine

## 2016-09-16 HISTORY — PX: INCISION AND DRAINAGE ABSCESS: SHX5864

## 2016-09-16 HISTORY — PX: FLOOR OF MOUTH BIOPSY: SHX5834

## 2016-09-16 LAB — CBC
HCT: 34.2 % — ABNORMAL LOW (ref 39.0–52.0)
Hemoglobin: 11.4 g/dL — ABNORMAL LOW (ref 13.0–17.0)
MCH: 31.2 pg (ref 26.0–34.0)
MCHC: 33.3 g/dL (ref 30.0–36.0)
MCV: 93.7 fL (ref 78.0–100.0)
PLATELETS: 381 10*3/uL (ref 150–400)
RBC: 3.65 MIL/uL — AB (ref 4.22–5.81)
RDW: 13.6 % (ref 11.5–15.5)
WBC: 3 10*3/uL — ABNORMAL LOW (ref 4.0–10.5)

## 2016-09-16 LAB — BASIC METABOLIC PANEL
Anion gap: 6 (ref 5–15)
BUN: 7 mg/dL (ref 6–20)
CO2: 29 mmol/L (ref 22–32)
Calcium: 8.7 mg/dL — ABNORMAL LOW (ref 8.9–10.3)
Chloride: 101 mmol/L (ref 101–111)
Creatinine, Ser: 1.16 mg/dL (ref 0.61–1.24)
GFR calc Af Amer: >60 mL/min (ref >60)
GLUCOSE: 81 mg/dL (ref 65–99)
POTASSIUM: 3.4 mmol/L — AB (ref 3.5–5.1)
Sodium: 136 mmol/L (ref 135–145)

## 2016-09-16 LAB — SURGICAL PCR SCREEN
MRSA, PCR: NEGATIVE
Staphylococcus aureus: NEGATIVE

## 2016-09-16 SURGERY — INCISION AND DRAINAGE, ABSCESS
Anesthesia: General | Site: Mouth

## 2016-09-16 MED ORDER — SUCCINYLCHOLINE CHLORIDE 200 MG/10ML IV SOSY
PREFILLED_SYRINGE | INTRAVENOUS | Status: AC
  Filled 2016-09-16: qty 10

## 2016-09-16 MED ORDER — DEXAMETHASONE SODIUM PHOSPHATE 10 MG/ML IJ SOLN
INTRAMUSCULAR | Status: AC
  Filled 2016-09-16: qty 1

## 2016-09-16 MED ORDER — LACTATED RINGERS IV SOLN
INTRAVENOUS | Status: DC
  Administered 2016-09-16: 09:00:00 via INTRAVENOUS

## 2016-09-16 MED ORDER — OXYCODONE HCL 5 MG PO TABS: 5.0000 mg | ORAL_TABLET | Freq: Once | ORAL | Status: DC | PRN

## 2016-09-16 MED ORDER — MIDAZOLAM HCL 2 MG/2ML IJ SOLN
INTRAMUSCULAR | Status: DC | PRN
  Administered 2016-09-16: 2 mg via INTRAVENOUS

## 2016-09-16 MED ORDER — POTASSIUM CHLORIDE CRYS ER 20 MEQ PO TBCR
40.0000 meq | EXTENDED_RELEASE_TABLET | Freq: Once | ORAL | Status: AC
  Administered 2016-09-16: 40 meq via ORAL
  Filled 2016-09-16: qty 2

## 2016-09-16 MED ORDER — SODIUM CHLORIDE 0.9 % IR SOLN
Status: DC | PRN
  Administered 2016-09-16: 500 mL

## 2016-09-16 MED ORDER — PROPOFOL 10 MG/ML IV BOLUS
INTRAVENOUS | Status: DC | PRN
  Administered 2016-09-16 (×3): 50 mg via INTRAVENOUS
  Administered 2016-09-16: 180 mg via INTRAVENOUS

## 2016-09-16 MED ORDER — HYDROMORPHONE HCL 1 MG/ML IJ SOLN
INTRAMUSCULAR | Status: AC
  Administered 2016-09-16: 0.5 mg via INTRAVENOUS
  Filled 2016-09-16: qty 1

## 2016-09-16 MED ORDER — ONDANSETRON HCL 4 MG/2ML IJ SOLN: 4.0000 mg | Freq: Once | INTRAMUSCULAR | Status: DC | PRN

## 2016-09-16 MED ORDER — MIDAZOLAM HCL 2 MG/2ML IJ SOLN
INTRAMUSCULAR | Status: AC
  Filled 2016-09-16: qty 2

## 2016-09-16 MED ORDER — OXYCODONE HCL 5 MG/5ML PO SOLN: 5.0000 mg | Freq: Once | ORAL | Status: DC | PRN

## 2016-09-16 MED ORDER — PROPOFOL 10 MG/ML IV BOLUS
INTRAVENOUS | Status: AC
  Filled 2016-09-16: qty 20

## 2016-09-16 MED ORDER — LIDOCAINE 2% (20 MG/ML) 5 ML SYRINGE
INTRAMUSCULAR | Status: AC
  Filled 2016-09-16: qty 5

## 2016-09-16 MED ORDER — LIDOCAINE 2% (20 MG/ML) 5 ML SYRINGE
INTRAMUSCULAR | Status: DC | PRN
  Administered 2016-09-16: 60 mg via INTRAVENOUS

## 2016-09-16 MED ORDER — ONDANSETRON HCL 4 MG/2ML IJ SOLN
INTRAMUSCULAR | Status: DC | PRN
  Administered 2016-09-16: 4 mg via INTRAVENOUS

## 2016-09-16 MED ORDER — FENTANYL CITRATE (PF) 100 MCG/2ML IJ SOLN
INTRAMUSCULAR | Status: DC | PRN
  Administered 2016-09-16: 100 ug via INTRAVENOUS

## 2016-09-16 MED ORDER — SUCCINYLCHOLINE CHLORIDE 200 MG/10ML IV SOSY
PREFILLED_SYRINGE | INTRAVENOUS | Status: DC | PRN
  Administered 2016-09-16: 140 mg via INTRAVENOUS

## 2016-09-16 MED ORDER — ONDANSETRON HCL 4 MG/2ML IJ SOLN
INTRAMUSCULAR | Status: AC
  Filled 2016-09-16: qty 2

## 2016-09-16 MED ORDER — LACTATED RINGERS IV SOLN
INTRAVENOUS | Status: DC
  Administered 2016-09-16 (×2): via INTRAVENOUS

## 2016-09-16 MED ORDER — OXYCODONE-ACETAMINOPHEN 5-325 MG PO TABS
1.0000 | ORAL_TABLET | Freq: Four times a day (QID) | ORAL | Status: DC | PRN
  Administered 2016-09-16 – 2016-09-19 (×8): 2 via ORAL
  Administered 2016-09-19: 1 via ORAL
  Administered 2016-09-20 – 2016-09-21 (×3): 2 via ORAL
  Administered 2016-09-21: 1 via ORAL
  Filled 2016-09-16 (×7): qty 2
  Filled 2016-09-16: qty 1
  Filled 2016-09-16 (×7): qty 2

## 2016-09-16 MED ORDER — HYDROMORPHONE HCL 1 MG/ML IJ SOLN
1.0000 mg | INTRAMUSCULAR | Status: DC | PRN
  Administered 2016-09-16 – 2016-09-21 (×21): 1 mg via INTRAVENOUS
  Filled 2016-09-16 (×24): qty 1

## 2016-09-16 MED ORDER — HYDROMORPHONE HCL 1 MG/ML IJ SOLN
0.2500 mg | INTRAMUSCULAR | Status: DC | PRN
  Administered 2016-09-16 (×2): 0.5 mg via INTRAVENOUS

## 2016-09-16 MED ORDER — FENTANYL CITRATE (PF) 100 MCG/2ML IJ SOLN
INTRAMUSCULAR | Status: AC
  Filled 2016-09-16: qty 2

## 2016-09-16 MED ORDER — ONDANSETRON HCL 4 MG/2ML IJ SOLN
4.0000 mg | Freq: Once | INTRAMUSCULAR | Status: DC | PRN
Start: 2016-09-16 — End: 2016-09-16

## 2016-09-16 SURGICAL SUPPLY — 35 items
BLADE MYRINGOTOMY 45DEG STRL (BLADE) ×3 IMPLANT
CANISTER SUCTION 1200CC (MISCELLANEOUS) ×3 IMPLANT
CAP TITANIUM CLICK LOCK 3D (MISCELLANEOUS) ×3 IMPLANT
CATH ROBINSON RED A/P 10FR (CATHETERS) ×3 IMPLANT
COAGULATOR SUCT 6 FR SWTCH (ELECTROSURGICAL) ×1
COAGULATOR SUCT SWTCH 10FR 6 (ELECTROSURGICAL) ×2 IMPLANT
COTTONBALL LRG STERILE PKG (GAUZE/BANDAGES/DRESSINGS) ×3 IMPLANT
COVER MAYO STAND STRL (DRAPES) ×3 IMPLANT
COVER TABLE BACK 60X90 (DRAPES) ×3 IMPLANT
DRAPE PROXIMA HALF (DRAPES) ×3 IMPLANT
ELECT COATED BLADE 2.86 ST (ELECTRODE) ×3 IMPLANT
ELECT REM PT RETURN 9FT ADLT (ELECTROSURGICAL) ×3
ELECT REM PT RETURN 9FT PED (ELECTROSURGICAL)
ELECTRODE REM PT RETRN 9FT PED (ELECTROSURGICAL) IMPLANT
ELECTRODE REM PT RTRN 9FT ADLT (ELECTROSURGICAL) ×1 IMPLANT
GLOVE BIO SURGEON STRL SZ7.5 (GLOVE) ×3 IMPLANT
GOWN STRL REUS W/ TWL XL LVL3 (GOWN DISPOSABLE) ×2 IMPLANT
GOWN STRL REUS W/TWL XL LVL3 (GOWN DISPOSABLE) ×4
KIT BASIN OR (CUSTOM PROCEDURE TRAY) ×3 IMPLANT
NEEDLE 22X1 1/2 (OR ONLY) (NEEDLE) ×3 IMPLANT
NS IRRIG 1000ML POUR BTL (IV SOLUTION) ×3 IMPLANT
PENCIL FOOT CONTROL (ELECTRODE) ×3 IMPLANT
PROS SHEEHY TY XOMED (OTOLOGIC RELATED)
SOLUTION BUTLER CLEAR DIP (MISCELLANEOUS) ×3 IMPLANT
SPONGE TONSIL 1 RF SGL (DISPOSABLE) ×3 IMPLANT
SWAB COLLECTION DEVICE MRSA (MISCELLANEOUS) ×6 IMPLANT
SYR BULB 3OZ (MISCELLANEOUS) ×3 IMPLANT
TOWEL OR 17X24 6PK STRL BLUE (TOWEL DISPOSABLE) ×6 IMPLANT
TUBE CONNECTING 12'X1/4 (SUCTIONS) ×1
TUBE CONNECTING 12X1/4 (SUCTIONS) ×2 IMPLANT
TUBE EAR SHEEHY BUTTON 1.27 (OTOLOGIC RELATED) IMPLANT
TUBE EAR T MOD 1.32X4.8 BL (OTOLOGIC RELATED) IMPLANT
TUBE SALEM SUMP 12R W/ARV (TUBING) ×3 IMPLANT
TUBE T ENT MOD 1.32X4.8 BL (OTOLOGIC RELATED)
WAND COBLATOR 70 EVAC XTRA (SURGICAL WAND) ×3 IMPLANT

## 2016-09-16 NOTE — Anesthesia Procedure Notes (Signed)
Procedure Name: Intubation Date/Time: 09/16/2016 9:46 AM Performed by: Alanda AmassFRIEDMAN, Allien Melberg A Pre-anesthesia Checklist: Patient identified, Emergency Drugs available, Suction available, Patient being monitored and Timeout performed Patient Re-evaluated:Patient Re-evaluated prior to inductionOxygen Delivery Method: Circle System Utilized and Circle system utilized Preoxygenation: Pre-oxygenation with 100% oxygen Intubation Type: IV induction Laryngoscope Size: Mac and 3 Grade View: Grade I Tube type: Oral Tube size: 7.5 mm Number of attempts: 1 Airway Equipment and Method: Stylet Placement Confirmation: ETT inserted through vocal cords under direct vision,  positive ETCO2 and breath sounds checked- equal and bilateral Secured at: 21 cm Tube secured with: Tape Dental Injury: Teeth and Oropharynx as per pre-operative assessment

## 2016-09-16 NOTE — Transfer of Care (Signed)
Immediate Anesthesia Transfer of Care Note  Patient: Justin Ball  Procedure(s) Performed: Procedure(s): INCISION AND DRAINAGE ABSCESS (N/A) BIOPSY OF ORAL ABCESS (N/A)  Patient Location: PACU  Anesthesia Type:General  Level of Consciousness: awake  Airway & Oxygen Therapy: Patient Spontanous Breathing  Post-op Assessment: Report given to RN and Post -op Vital signs reviewed and stable  Post vital signs: Reviewed and stable  Last Vitals:  Vitals:   09/15/16 1312 09/16/16 0530  BP: (!) 112/56 119/76  Pulse: 70 (!) 114  Resp:  19  Temp:  36.9 C    Last Pain:  Vitals:   09/15/16 2313  TempSrc:   PainSc: 7       Patients Stated Pain Goal: 2 (09/15/16 0453)  Complications: No apparent anesthesia complications

## 2016-09-16 NOTE — Progress Notes (Signed)
PROGRESS NOTE  Justin SpurrLinston R Ball  GEX:[id number] DOB: 10/04/1991  DOA: 09/12/2016 PCP: No primary care provider on file.   Brief Narrative:  25 year old male with history of HIV/AIDS, noncompliant with medications, bipolar disorder, polysubstance abuse, [facility] with 2 weeks history of sore throat, sinus pressure, eye drainage, painful oral lesions and concern for oral abscess. ENT and ID consulted.   Assessment & Plan:   Principal Problem:   Ulcer of soft palate Active Problems:   Bipolar disorder (HCC)   H/O noncompliance with medical treatment - finincial, presenting hazards to health   AIDS (HCC)   Acute hypokalemia   Anemia   Alcohol use (HCC)   Oral abscess   Right floor of mouth infection (phlegmon versus abscess) and multiple oral ulcers - Discussed with ENT and ID. As per ENT, IV antibiotics 48 hours and reassess with repeat CT to decide conservative management versus OR for I&D - Patient was started on IV Zosyn and monitored for response - Initially felt to get better but then complained of more pain and swelling. Follow-up CT showed worsening of abscess. - Status post I&D of abscess and biopsy of roof of mouth ulcer on 9/23. Follow results.  HIV/AIDS - Continue ART per ID. - CD4: 20.  Noncompliance - Discussed extensively with patient. He indicates that he does not get time off from his work to keep up physician appointments.  Hypokalemia - Replace and follow.  Bipolar disorder - not on medications pta.  Alcohol use - No withdrawal  Anemia - May be dilutional. Low serum folate >continue at discharge. Stable.   Leukopenia - Seems to be a chronic problem for the patient. Monitor closely with follow-up CBCs due to being on antibiotics which may also cause it.   DVT prophylaxis: Heparin Code Status: Full Family Communication: Discussed at length with patient's mother at bedside (after patient consented), updated care and answered  questions. She states that she will ensure that patient will comply with outpatient follow-up with MDs and medications. Disposition Plan: DC home when medically improved   Consultants:   ID  ENT  Procedures:   Incision and drainage of right-sided floor of mouth abscess and biopsy of roof of mouth ulcers on 09/16/16.  Antimicrobials:   IV Zosyn    Subjective: Patient seen several hours postoperatively. Mild appropriate postop pain. Denies any other complaints.  Objective:  Vitals:   09/16/16 1101 09/16/16 1116 09/16/16 1121 09/16/16 1136  BP:  129/77  131/84  Pulse: 82 80 79 69  Resp: (!) 6 (!) 9 (!) 24   Temp:      TempSrc:      SpO2: 95% 95% 96% 99%  Weight:      Height:      Temperature 97.2F.  Intake/Output Summary (Last 24 hours) at 09/16/16 1519 Last data filed at 09/16/16 1034  Gross per 24 hour  Intake             1300 ml  Output                5 ml  Net             1295 ml   Filed Weights   09/11/16 2316  Weight: 67 kg (147 lb 12.8 oz)    Examination:  General exam: Pleasant young male sitting up comfortably in the bed. Does not appear septic or toxic. ENT: Did not examine today due to recent procedure and associated pain. Respiratory system:  Clear to auscultation. Respiratory effort normal. Cardiovascular system: S1 & S2 heard, RRR. No JVD, murmurs, rubs, gallops or clicks. No pedal edema. Gastrointestinal system: Abdomen is nondistended, soft and nontender. No organomegaly or masses felt. Normal bowel sounds heard. Central nervous system: Alert and oriented. No focal neurological deficits. Extremities: Symmetric 5 x 5 power. Skin: No rashes, lesions or ulcers Psychiatry: Judgement and insight appear normal. Mood & affect appropriate.     Data Reviewed: I have personally reviewed following labs and imaging studies  CBC:  Recent Labs Lab 09/12/16 0315 09/14/16 0451 09/15/16 0618 09/16/16 0556  WBC 4.9 4.8 3.5* 3.0*  NEUTROABS 3.9  --    --   --   HGB 12.0* 11.1* 11.4* 11.4*  HCT 35.7* 34.5* 34.0* 34.2*  MCV 92.7 94.3 91.9 93.7  PLT 261 313 345 381   Basic Metabolic Panel:  Recent Labs Lab 09/12/16 0315 09/12/16 1251 09/14/16 0451 09/16/16 0556  NA 139  --  137 136  K 3.4*  --  3.9 3.4*  CL 107  --  105 101  CO2 24  --  24 29  GLUCOSE 78  --  79 81  BUN 13  --  <5* 7  CREATININE 0.92  --  0.89 1.16  CALCIUM 8.7*  --  8.9 8.7*  MG  --  2.0  --   --   PHOS  --  2.7  --   --    GFR: Estimated Creatinine Clearance: 92.3 mL/min (by C-G formula based on SCr of 1.16 mg/dL). Liver Function Tests: No results for input(s): AST, ALT, ALKPHOS, BILITOT, PROT, ALBUMIN in the last 168 hours. No results for input(s): LIPASE, AMYLASE in the last 168 hours. No results for input(s): AMMONIA in the last 168 hours. Coagulation Profile: No results for input(s): INR, PROTIME in the last 168 hours. Cardiac Enzymes: No results for input(s): CKTOTAL, CKMB, CKMBINDEX, TROPONINI in the last 168 hours. BNP (last 3 results) No results for input(s): PROBNP in the last 8760 hours. HbA1C: No results for input(s): HGBA1C in the last 72 hours. CBG: No results for input(s): GLUCAP in the last 168 hours. Lipid Profile: No results for input(s): CHOL, HDL, LDLCALC, TRIG, CHOLHDL, LDLDIRECT in the last 72 hours. Thyroid Function Tests: No results for input(s): TSH, T4TOTAL, FREET4, T3FREE, THYROIDAB in the last 72 hours. Anemia Panel: No results for input(s): VITAMINB12, FOLATE, FERRITIN, TIBC, IRON, RETICCTPCT in the last 72 hours.  Sepsis Labs: No results for input(s): PROCALCITON, LATICACIDVEN in the last 168 hours.  Recent Results (from the past 240 hour(s))  Rapid strep screen     Status: None   Collection Time: 09/11/16 11:25 PM  Result Value Ref Range Status   Streptococcus, Group A Screen (Direct) NEGATIVE NEGATIVE Final    Comment: (NOTE) A Rapid Antigen test may result negative if the antigen level in the sample is below  the detection level of this test. The FDA has not cleared this test as a stand-alone test therefore the rapid antigen negative result has reflexed to a Group A Strep culture.   Culture, group A strep     Status: None   Collection Time: 09/11/16 11:25 PM  Result Value Ref Range Status   Specimen Description THROAT  Final   Special Requests NONE Reflexed from [id number]  Final   Culture NO GROUP A STREP (S.PYOGENES) ISOLATED  Final   Report Status 09/14/2016 FINAL  Final  Surgical pcr screen     Status: None  Collection Time: 09/15/16  9:36 PM  Result Value Ref Range Status   MRSA, PCR NEGATIVE NEGATIVE Final   Staphylococcus aureus NEGATIVE NEGATIVE Final    Comment:        The Xpert SA Assay (FDA approved for NASAL specimens in patients over 55 years of age), is one component of a comprehensive surveillance program.  Test performance has been validated by [facility] for patients greater than or equal to 67 year old. It is not intended to diagnose infection nor to guide or monitor treatment.   Aerobic/Anaerobic Culture (surgical/deep wound)     Status: None (Preliminary result)   Collection Time: 09/16/16 10:49 AM  Result Value Ref Range Status   Specimen Description ABSCESS ORAL  Final   Special Requests NONE  Final   Gram Stain   Final    RARE WBC PRESENT,BOTH PMN AND MONONUCLEAR RARE GRAM VARIABLE ROD RARE GRAM POSITIVE COCCI IN CHAINS    Culture PENDING  Incomplete   Report Status PENDING  Incomplete         Radiology Studies: Ct Soft Tissue Neck W Contrast  Result Date: 09/15/2016 CLINICAL DATA:  Right floor of mouth infection and multiple oral ulcers. Follow-up after 2 days of IV antibiotics. EXAM: CT NECK WITH CONTRAST TECHNIQUE: Multidetector CT imaging of the neck was performed using the standard protocol following the bolus administration of intravenous contrast. CONTRAST:  75mL ISOVUE-300 IOPAMIDOL (ISOVUE-300) INJECTION 61% COMPARISON:  09/12/2016 FINDINGS:  Pharynx and larynx: Fluid collection in the right floor of mouth has mildly increased in size, now measuring 2.2 x 1.6 x 2.0 cm (series 3, image 49). Soft tissue ulceration at the posterior margin of the hard palate is again noted, with less gas than on the prior study. Inflammatory changes in the soft palate, left lateral nasopharynx, and upper left lateral oropharynx have progressed from the prior study, and there is now the appearance of more organized though complex fluid collections in these regions. This extends over a maximal area of approximately 3.9 x 2.9 cm on axial images (series 3, image 28) with extension to the midline of the soft palate. The airway remains patent. Larynx is unremarkable. No retropharyngeal fluid collection. Salivary glands: Submandibular and parotid glands are unremarkable. Thyroid: Unremarkable. Lymph nodes: Numerous subcentimeter short axis cervical lymph nodes are again seen bilaterally, similar to the prior study and likely reactive. Vascular: Major vascular structures of the neck appear patent. Limited intracranial: Unremarkable. Visualized orbits: Unremarkable. Mastoids and visualized paranasal sinuses: Mild mucosal thickening and small volume fluid in the sphenoid sinuses, increased from prior. Trace bilateral maxillary sinus mucosal thickening. Clear mastoid air cells. Skeleton: No acute abnormality or suspicious lytic or blastic osseous lesion identified. Upper chest: Similar appearance of scattered ground-glass and tree-in-bud nodular densities in both upper lobes. Other: None. IMPRESSION: 1. Evidence of worsening infection with mildly increased size of right floor of mouth abscess and development of new organized abscesses and phlegmon involving the soft palate and left upper pharynx. These results will be called to the ordering clinician or representative by the Radiologist Assistant, and communication documented in the PACS or zVision Dashboard. Electronically Signed    By: Sebastian Ache M.D.   On: 09/15/2016 18:04        Scheduled Meds: . darunavir-cobicistat  1 tablet Oral Daily  . dolutegravir  50 mg Oral Daily  . emtricitabine-tenofovir AF  1 tablet Oral Daily  . feeding supplement (ENSURE ENLIVE)  237 mL Oral BID BM  . folic  acid  1 mg Oral Daily  . heparin  5,000 Units Subcutaneous Q8H  . multivitamin with minerals  1 tablet Oral Daily  . piperacillin-tazobactam (ZOSYN)  IV  3.375 g Intravenous Q8H  . thiamine  100 mg Oral Daily   Continuous Infusions: . lactated ringers 50 mL/hr at 09/16/16 0912  . lactated ringers       LOS: 4 days    Time spent: 25 minutes.    [facility], MD [facility] Pager [phone]  If 7PM-7AM, please contact night-coverage [email or web address] Password [facility] 09/16/2016, 3:19 PM

## 2016-09-16 NOTE — Anesthesia Postprocedure Evaluation (Signed)
Anesthesia Post Note  Patient: Ola SpurrLinston R Pates  Procedure(s) Performed: Procedure(s) (LRB): INCISION AND DRAINAGE ABSCESS (N/A) BIOPSY OF ORAL ABCESS (N/A)  Patient location during evaluation: PACU Anesthesia Type: General Level of consciousness: awake, awake and alert and oriented Pain management: pain level controlled Vital Signs Assessment: post-procedure vital signs reviewed and stable Respiratory status: spontaneous breathing, nonlabored ventilation and respiratory function stable Cardiovascular status: blood pressure returned to baseline Anesthetic complications: no    Last Vitals:  Vitals:   09/16/16 1121 09/16/16 1136  BP:  131/84  Pulse: 79 69  Resp: (!) 24   Temp:      Last Pain:  Vitals:   09/16/16 1217  TempSrc:   PainSc: 10-Worst pain ever                 Shenique Childers COKER

## 2016-09-16 NOTE — Progress Notes (Signed)
Pt became very tearful when told that surgery is going to be done in am. His concern was that he needed to go home so that he can go to work. When told that he could get a note that states he was in hospital, his response was I have a lot on me you just don't understand. Suggested visit from chaplain and this was refused. He did calm down. Will continue to monitor and support. Ilean SkillVeronica Jaelie Aguilera LPN

## 2016-09-16 NOTE — Op Note (Signed)
DATE OF PROCEDURE:  09/16/2016                              OPERATIVE REPORT  SURGEON:  Newman PiesSu Axton Cihlar, MD  PREOPERATIVE DIAGNOSES: 1. Right floor of mouth abscess 2. Soft palate ulcers  POSTOPERATIVE DIAGNOSES: 1. Right floor of mouth abscess 2. Soft palate ulcers  PROCEDURE PERFORMED:   1) Incision and drainage of right floor of mouth abscess 2) Biopsy of soft palate ulcers (x2)  ANESTHESIA:  General endotracheal tube anesthesia.  COMPLICATIONS:  None.  ESTIMATED BLOOD LOSS:  20ml  INDICATION FOR PROCEDURE:  Ola SpurrLinston R Nienaber is a 25 y.o. male with HIV/AIDS who was recently admitted for treatment of his worsening mouth and throat pain. He was noted to have a right floor of mouth phlegmon/abscess. He was also noted to have 2 deep palatal ulcers. He was placed on IV antibiotic for 2 days. Follow-up CT scan showed worsening of his floor of mouth abscess. In addition, there was also a question of possible left parapharyngeal abscess.Based on the above findings, the decision was made for the patient to undergo the above-stated procedures.  The risks, benefits, alternatives, and details of the procedure were discussed with the patient.  Questions were invited and answered.  Informed consent was obtained.  DESCRIPTION:  The patient was taken to the operating room and placed supine on the operating table.  General endotracheal tube anesthesia was administered by the anesthesiologist.  The patient was positioned and prepped and draped in a standard fashion for oral surgery.   An 18-gauge needle was used to make multiple passes over the right floor of mouth. 5 mm of abscess fluid was suctioned. Aerobic, anaerobic, and fungal cultures were sent. The abscess cavity was then incised opened with a 15 blade. The cavity was copiously irrigated with antibiotic solution.  Attention was then focused on the left parapharyngeal space. The parapharyngeal space was probed with an 18-gauge needle. No obvious abscess  cavity was encountered.  Attention was then focused on the 2 soft palate ulcers. Biopsy specimens were obtained from both ulcers. They was sent to the pathology department for permanent histologic identification.  The care of the patient was turned over to the anesthesiologist.  The patient was awakened from anesthesia without difficulty.  The patient was extubated and transferred to the recovery room in good condition.  OPERATIVE FINDINGS:  The right floor of mouth abscess cavity was drained and opened. It was copiously irrigated with antibiotic solution. Biopsy specimens were obtained from the 2 soft palate ulcers. No drainable abscess cavity was noted within the left parapharyngeal space.  SPECIMEN:  Palate ulcer biopsy specimens.  FOLLOWUP CARE:  The patient will be returned to his hospital bed. He will continue his IV antibiotic treatment regimen.   Michaeal Davis,SUI W 09/16/2016 10:28 AM

## 2016-09-16 NOTE — Anesthesia Preprocedure Evaluation (Signed)
Anesthesia Evaluation  Patient identified by MRN, date of birth, ID band Patient awake    Reviewed: Allergy & Precautions, NPO status , Patient's Chart, lab work & pertinent test results  Airway Mallampati: II  TM Distance: >3 FB Neck ROM: Full    Dental  (+) Teeth Intact, Dental Advisory Given   Pulmonary former smoker,    breath sounds clear to auscultation       Cardiovascular  Rhythm:Regular Rate:Normal     Neuro/Psych    GI/Hepatic   Endo/Other    Renal/GU      Musculoskeletal   Abdominal   Peds  Hematology   Anesthesia Other Findings   Reproductive/Obstetrics                            Anesthesia Physical Anesthesia Plan  ASA: III and emergent  Anesthesia Plan: General   Post-op Pain Management:    Induction: Intravenous  Airway Management Planned: Oral ETT  Additional Equipment:   Intra-op Plan:   Post-operative Plan: Extubation in OR  Informed Consent: I have reviewed the patients History and Physical, chart, labs and discussed the procedure including the risks, benefits and alternatives for the proposed anesthesia with the patient or authorized representative who has indicated his/her understanding and acceptance.   Dental advisory given  Plan Discussed with: CRNA and Anesthesiologist  Anesthesia Plan Comments:        Anesthesia Quick Evaluation  

## 2016-09-17 DIAGNOSIS — K122 Cellulitis and abscess of mouth: Secondary | ICD-10-CM

## 2016-09-17 DIAGNOSIS — R21 Rash and other nonspecific skin eruption: Secondary | ICD-10-CM

## 2016-09-17 DIAGNOSIS — F17200 Nicotine dependence, unspecified, uncomplicated: Secondary | ICD-10-CM

## 2016-09-17 LAB — BASIC METABOLIC PANEL
ANION GAP: 6 (ref 5–15)
BUN: 6 mg/dL (ref 6–20)
CHLORIDE: 103 mmol/L (ref 101–111)
CO2: 29 mmol/L (ref 22–32)
Calcium: 9.1 mg/dL (ref 8.9–10.3)
Creatinine, Ser: 1.03 mg/dL (ref 0.61–1.24)
GFR calc non Af Amer: >60 mL/min (ref >60)
Glucose, Bld: 85 mg/dL (ref 65–99)
POTASSIUM: 3.9 mmol/L (ref 3.5–5.1)
SODIUM: 138 mmol/L (ref 135–145)

## 2016-09-17 LAB — CBC
HCT: 33.3 % — ABNORMAL LOW (ref 39.0–52.0)
HEMOGLOBIN: 10.8 g/dL — AB (ref 13.0–17.0)
MCH: 30.6 pg (ref 26.0–34.0)
MCHC: 32.4 g/dL (ref 30.0–36.0)
MCV: 94.3 fL (ref 78.0–100.0)
Platelets: 427 10*3/uL — ABNORMAL HIGH (ref 150–400)
RBC: 3.53 MIL/uL — AB (ref 4.22–5.81)
RDW: 13.7 % (ref 11.5–15.5)
WBC: 4.4 10*3/uL (ref 4.0–10.5)

## 2016-09-17 MED ORDER — NICOTINE 14 MG/24HR TD PT24
14.0000 mg | MEDICATED_PATCH | Freq: Every day | TRANSDERMAL | Status: DC
  Administered 2016-09-17 – 2016-09-21 (×5): 14 mg via TRANSDERMAL
  Filled 2016-09-17 (×5): qty 1

## 2016-09-17 MED ORDER — ONDANSETRON HCL 4 MG/2ML IJ SOLN
4.0000 mg | Freq: Four times a day (QID) | INTRAMUSCULAR | Status: DC | PRN
  Administered 2016-09-17 – 2016-09-20 (×2): 4 mg via INTRAVENOUS
  Filled 2016-09-17 (×2): qty 2

## 2016-09-17 MED ORDER — FLUCONAZOLE 40 MG/ML PO SUSR
100.0000 mg | Freq: Every day | ORAL | Status: DC
  Administered 2016-09-17: 100 mg via ORAL
  Filled 2016-09-17 (×2): qty 2.5

## 2016-09-17 NOTE — Progress Notes (Signed)
PROGRESS NOTE  Justin Ball  ZOX:[id number] DOB: June 08, 1991  DOA: 09/12/2016 PCP: No primary care provider on file.   Brief Narrative:  25 year old male with history of HIV/AIDS, noncompliant with medications, bipolar disorder, polysubstance abuse, presented to [facility] ED on 09/12/16 with 2 weeks history of sore throat, sinus pressure, eye drainage, painful oral lesions and concern for oral abscess. ENT and ID consulted.   Assessment & Plan:   Principal Problem:   Ulcer of soft palate Active Problems:   Bipolar disorder (HCC)   H/O noncompliance with medical treatment - finincial, presenting hazards to health   AIDS (HCC)   Acute hypokalemia   Anemia   Alcohol use (HCC)   Oral abscess   Right Submandibular abscess and multiple oral ulcers - ENT and ID were consulted. Patient was initially treated with IV Zosyn for approximately 48 hours. CT was repeated which showed enlarging abscess. He was taken to the OR on 9/23 and underwent I&D of right submandibular abscess and a large amount of purulent fluid was drained and the palatal ulcers were biopsied-results pending - Improving. Follow culture and pathology results.  HIV/AIDS - Continue ART per ID. - CD4: 20.  Noncompliance - Discussed extensively with patient. He indicates that he does not get time off from his work to keep up physician appointments.  Hypokalemia - Replaced.  Bipolar disorder - not on medications pta.  Alcohol use - No withdrawal  Anemia - May be dilutional. Low serum folate >continue at discharge. Stable.   Leukopenia - Seems to be a chronic problem for the patient. Monitor closely with follow-up CBCs due to being on antibiotics which may also cause it.  Tobacco abuse - Cessation counseled. Nicotine patch started upon request.    DVT prophylaxis: Heparin Code Status: Full Family Communication: None at bedside. Disposition Plan: DC home when medically improved   Consultants:    ID  ENT  Procedures:   Incision and drainage of right-sided floor of mouth abscess and biopsy of roof of mouth ulcers on 09/16/16.  Antimicrobials:   IV Zosyn    Subjective: Mouth pain is better> tolerating diet. Wants to shower.  Objective:  Vitals:   09/16/16 1121 09/16/16 1136 09/16/16 2340 09/17/16 0601  BP:  131/84 115/75 113/72  Pulse: 79 69 70 67  Resp: (!) 24  18 19   Temp:   98.6 F (37 C) 98.2 F (36.8 C)  TempSrc:   Oral Oral  SpO2: 96% 99% 100% 99%  Weight:      Height:      Temperature 97.28F.  Intake/Output Summary (Last 24 hours) at 09/17/16 1127 Last data filed at 09/17/16 8119  Gross per 24 hour  Intake          1525.83 ml  Output                0 ml  Net          1525.83 ml   Filed Weights   09/11/16 2316  Weight: 67 kg (147 lb 12.8 oz)    Examination:  General exam: Pleasant young male sitting up comfortably in the bed eating breakfast. Does not appear septic or toxic. ENT: 2 Palatal ulcers ulcers without acute findings. Mild tenderness at R floor of mouth. Respiratory system: Clear to auscultation. Respiratory effort normal. Cardiovascular system: S1 & S2 heard, RRR. No JVD, murmurs, rubs, gallops or clicks. No pedal edema. Gastrointestinal system: Abdomen is nondistended, soft and nontender. No organomegaly or masses felt. Normal bowel  sounds heard. Central nervous system: Alert and oriented. No focal neurological deficits. Extremities: Symmetric 5 x 5 power. Skin: No rashes, lesions or ulcers Psychiatry: Judgement and insight appear normal. Mood & affect appropriate.     Data Reviewed: I have personally reviewed following labs and imaging studies  CBC:  Recent Labs Lab 09/12/16 0315 09/14/16 0451 09/15/16 0618 09/16/16 0556 09/17/16 0511  WBC 4.9 4.8 3.5* 3.0* 4.4  NEUTROABS 3.9  --   --   --   --   HGB 12.0* 11.1* 11.4* 11.4* 10.8*  HCT 35.7* 34.5* 34.0* 34.2* 33.3*  MCV 92.7 94.3 91.9 93.7 94.3  PLT 261 313 345 381  427*   Basic Metabolic Panel:  Recent Labs Lab 09/12/16 0315 09/12/16 1251 09/14/16 0451 09/16/16 0556 09/17/16 0511  NA 139  --  137 136 138  K 3.4*  --  3.9 3.4* 3.9  CL 107  --  105 101 103  CO2 24  --  24 29 29   GLUCOSE 78  --  79 81 85  BUN 13  --  <5* 7 6  CREATININE 0.92  --  0.89 1.16 1.03  CALCIUM 8.7*  --  8.9 8.7* 9.1  MG  --  2.0  --   --   --   PHOS  --  2.7  --   --   --    GFR: Estimated Creatinine Clearance: 103.9 mL/min (by C-G formula based on SCr of 1.03 mg/dL). Liver Function Tests: No results for input(s): AST, ALT, ALKPHOS, BILITOT, PROT, ALBUMIN in the last 168 hours. No results for input(s): LIPASE, AMYLASE in the last 168 hours. No results for input(s): AMMONIA in the last 168 hours. Coagulation Profile: No results for input(s): INR, PROTIME in the last 168 hours. Cardiac Enzymes: No results for input(s): CKTOTAL, CKMB, CKMBINDEX, TROPONINI in the last 168 hours. BNP (last 3 results) No results for input(s): PROBNP in the last 8760 hours. HbA1C: No results for input(s): HGBA1C in the last 72 hours. CBG: No results for input(s): GLUCAP in the last 168 hours. Lipid Profile: No results for input(s): CHOL, HDL, LDLCALC, TRIG, CHOLHDL, LDLDIRECT in the last 72 hours. Thyroid Function Tests: No results for input(s): TSH, T4TOTAL, FREET4, T3FREE, THYROIDAB in the last 72 hours. Anemia Panel: No results for input(s): VITAMINB12, FOLATE, FERRITIN, TIBC, IRON, RETICCTPCT in the last 72 hours.  Sepsis Labs: No results for input(s): PROCALCITON, LATICACIDVEN in the last 168 hours.  Recent Results (from the past 240 hour(s))  Rapid strep screen     Status: None   Collection Time: 09/11/16 11:25 PM  Result Value Ref Range Status   Streptococcus, Group A Screen (Direct) NEGATIVE NEGATIVE Final    Comment: (NOTE) A Rapid Antigen test may result negative if the antigen level in the sample is below the detection level of this test. The FDA has  not cleared this test as a stand-alone test therefore the rapid antigen negative result has reflexed to a Group A Strep culture.   Culture, group A strep     Status: None   Collection Time: 09/11/16 11:25 PM  Result Value Ref Range Status   Specimen Description THROAT  Final   Special Requests NONE Reflexed from [id number]  Final   Culture NO GROUP A STREP (S.PYOGENES) ISOLATED  Final   Report Status 09/14/2016 FINAL  Final  Surgical pcr screen     Status: None   Collection Time: 09/15/16  9:36 PM  Result Value Ref Range  Status   MRSA, PCR NEGATIVE NEGATIVE Final   Staphylococcus aureus NEGATIVE NEGATIVE Final    Comment:        The Xpert SA Assay (FDA approved for NASAL specimens in patients over 54 years of age), is one component of a comprehensive surveillance program.  Test performance has been validated by [facility] for patients greater than or equal to 23 year old. It is not intended to diagnose infection nor to guide or monitor treatment.   Aerobic/Anaerobic Culture (surgical/deep wound)     Status: None (Preliminary result)   Collection Time: 09/16/16 10:49 AM  Result Value Ref Range Status   Specimen Description ABSCESS ORAL  Final   Special Requests NONE  Final   Gram Stain   Final    RARE WBC PRESENT,BOTH PMN AND MONONUCLEAR RARE GRAM VARIABLE ROD RARE GRAM POSITIVE COCCI IN CHAINS    Culture CULTURE REINCUBATED FOR BETTER GROWTH  Final   Report Status PENDING  Incomplete         Radiology Studies: Ct Soft Tissue Neck W Contrast  Result Date: 09/15/2016 CLINICAL DATA:  Right floor of mouth infection and multiple oral ulcers. Follow-up after 2 days of IV antibiotics. EXAM: CT NECK WITH CONTRAST TECHNIQUE: Multidetector CT imaging of the neck was performed using the standard protocol following the bolus administration of intravenous contrast. CONTRAST:  75mL ISOVUE-300 IOPAMIDOL (ISOVUE-300) INJECTION 61% COMPARISON:  09/12/2016 FINDINGS: Pharynx and larynx:  Fluid collection in the right floor of mouth has mildly increased in size, now measuring 2.2 x 1.6 x 2.0 cm (series 3, image 49). Soft tissue ulceration at the posterior margin of the hard palate is again noted, with less gas than on the prior study. Inflammatory changes in the soft palate, left lateral nasopharynx, and upper left lateral oropharynx have progressed from the prior study, and there is now the appearance of more organized though complex fluid collections in these regions. This extends over a maximal area of approximately 3.9 x 2.9 cm on axial images (series 3, image 28) with extension to the midline of the soft palate. The airway remains patent. Larynx is unremarkable. No retropharyngeal fluid collection. Salivary glands: Submandibular and parotid glands are unremarkable. Thyroid: Unremarkable. Lymph nodes: Numerous subcentimeter short axis cervical lymph nodes are again seen bilaterally, similar to the prior study and likely reactive. Vascular: Major vascular structures of the neck appear patent. Limited intracranial: Unremarkable. Visualized orbits: Unremarkable. Mastoids and visualized paranasal sinuses: Mild mucosal thickening and small volume fluid in the sphenoid sinuses, increased from prior. Trace bilateral maxillary sinus mucosal thickening. Clear mastoid air cells. Skeleton: No acute abnormality or suspicious lytic or blastic osseous lesion identified. Upper chest: Similar appearance of scattered ground-glass and tree-in-bud nodular densities in both upper lobes. Other: None. IMPRESSION: 1. Evidence of worsening infection with mildly increased size of right floor of mouth abscess and development of new organized abscesses and phlegmon involving the soft palate and left upper pharynx. These results will be called to the ordering clinician or representative by the Radiologist Assistant, and communication documented in the PACS or zVision Dashboard. Electronically Signed   By: Sebastian Ache M.D.    On: 09/15/2016 18:04        Scheduled Meds: . darunavir-cobicistat  1 tablet Oral Daily  . dolutegravir  50 mg Oral Daily  . emtricitabine-tenofovir AF  1 tablet Oral Daily  . feeding supplement (ENSURE ENLIVE)  237 mL Oral BID BM  . folic acid  1 mg Oral Daily  .  heparin  5,000 Units Subcutaneous Q8H  . multivitamin with minerals  1 tablet Oral Daily  . nicotine  14 mg Transdermal Daily  . piperacillin-tazobactam (ZOSYN)  IV  3.375 g Intravenous Q8H  . thiamine  100 mg Oral Daily   Continuous Infusions:     LOS: 5 days    Time spent: 25 minutes.    [facility] LPNGALGI,Myeesha Shane, MD [facility] Pager [phone]  If 7PM-7AM, please contact night-coverage [email or web address] Password [email or web address] 09/17/2016, 11:27 AM

## 2016-09-17 NOTE — Progress Notes (Signed)
Subjective: Mouth and throat pain has slightly improved. No postop difficulty.  Objective: Vital signs in last 24 hours: Temp:  [97.9 F (36.6 C)-98.6 F (37 C)] 98.2 F (36.8 C) (09/24 0601) Pulse Rate:  [67-96] 67 (09/24 0601) Resp:  [0-24] 19 (09/24 0601) BP: (113-138)/(72-92) 113/72 (09/24 0601) SpO2:  [95 %-100 %] 99 % (09/24 0601)  Physical Exam Constitutional: He appears well-developedand well-nourished. No distress.  Head: Normocephalicand atraumatic.  Mouth/Throat: Uvula is midlineand oropharynx is clear and moist. No trismus. Patient managing oral secretions. Two ulcerations noted on palate, s/p biopsy. The right floor of mouth is mildlytender to palpation. Eyes: Conjunctivaeand EOMare normal. Pupils are equal, round, and reactive to light. No scleral icterus.  Neck: Normal range of motionand phonation normal. Neck supple. No neck rigidity. Normal range of motionpresent.  No stridor.  Neurological: He is alert and oriented x3. Skin: Skin is warmand dry. He is not diaphoretic.  Psychiatric: He has a normal mood and affect. His behavior is normal.    Recent Labs  09/16/16 0556 09/17/16 0511  WBC 3.0* 4.4  HGB 11.4* 10.8*  HCT 34.2* 33.3*  PLT 381 427*    Recent Labs  09/16/16 0556 09/17/16 0511  NA 136 138  K 3.4* 3.9  CL 101 103  CO2 29 29  GLUCOSE 81 85  BUN 7 6  CREATININE 1.16 1.03  CALCIUM 8.7* 9.1    Medications:  I have reviewed the patient's current medications. Scheduled: . darunavir-cobicistat  1 tablet Oral Daily  . dolutegravir  50 mg Oral Daily  . emtricitabine-tenofovir AF  1 tablet Oral Daily  . feeding supplement (ENSURE ENLIVE)  237 mL Oral BID BM  . folic acid  1 mg Oral Daily  . heparin  5,000 Units Subcutaneous Q8H  . multivitamin with minerals  1 tablet Oral Daily  . piperacillin-tazobactam (ZOSYN)  IV  3.375 g Intravenous Q8H  . thiamine  100 mg Oral Daily   ZOX:[id number]:acetaminophen **OR** [DISCONTINUED] acetaminophen,  HYDROmorphone (DILAUDID) injection, oxyCODONE-acetaminophen  Assessment/Plan: POD #1 s/p I&D of submandibular abscess. A large amount of purulent fluid was drained. The abscess cavity copiously irrigated. Palatal ulcers were biopsied. No abscess noted in the parapharyngeal walls. Continue abx per ID service.   LOS: 5 days   Tiffiany Beadles,SUI W 09/17/2016, 7:08 AM

## 2016-09-17 NOTE — Progress Notes (Signed)
Pt vomited x1. Paged Dr Waymon AmatoHongalgi for anti-nausea medication. Pt remains nauseous after emesis episode. Waiting to hear back from dr.

## 2016-09-17 NOTE — Progress Notes (Signed)
INFECTIOUS DISEASE PROGRESS NOTE  ID: ILYAAS Ball is a 25 y.o. male with  Principal Problem:   Ulcer of soft palate Active Problems:   Bipolar disorder (HCC)   H/O noncompliance with medical treatment - finincial, presenting hazards to health   AIDS (HCC)   Acute hypokalemia   Anemia   Alcohol use (HCC)   Oral abscess  Subjective: C/o pruritic bumps on back since back on ART.  Decreased oral pain.   Abtx:  Anti-infectives    Start     Dose/Rate Route Frequency Ordered Stop   09/16/16 0959  polymyxin B 500,000 Units, bacitracin 50,000 Units in sodium chloride irrigation 0.9 % 500 mL irrigation  Status:  Discontinued       As needed 09/16/16 1000 09/16/16 1028   09/13/16 1200  piperacillin-tazobactam (ZOSYN) IVPB 3.375 g  Status:  Discontinued     3.375 g 12.5 mL/hr over 240 Minutes Intravenous Every 8 hours 09/13/16 1053 09/13/16 1055   09/13/16 1200  piperacillin-tazobactam (ZOSYN) IVPB 3.375 g     3.375 g 12.5 mL/hr over 240 Minutes Intravenous Every 8 hours 09/13/16 1055     09/13/16 0800  Darunavir Ethanolate (PREZISTA) tablet 800 mg  Status:  Discontinued     800 mg Oral Daily with breakfast 09/12/16 0938 09/12/16 0939   09/13/16 0800  ritonavir ([name]) tablet 100 mg  Status:  Discontinued     100 mg Oral Daily with breakfast 09/12/16 0938 09/12/16 0939   09/12/16 1400  clindamycin (CLEOCIN) IVPB 600 mg  Status:  Discontinued     600 mg 100 mL/hr over 30 Minutes Intravenous Every 8 hours 09/12/16 0748 09/12/16 0926   09/12/16 1030  emtricitabine-tenofovir AF (DESCOVY) 200-25 MG per tablet 1 tablet     1 tablet Oral Daily 09/12/16 0938     09/12/16 1030  dolutegravir (TIVICAY) tablet 50 mg     50 mg Oral Daily 09/12/16 0938     09/12/16 1030  darunavir-cobicistat (PREZCOBIX) 800-150 MG per tablet 1 tablet     1 tablet Oral Daily 09/12/16 0939     09/12/16 0545  clindamycin (CLEOCIN) IVPB 600 mg     600 mg 100 mL/hr over 30 Minutes Intravenous  Once 09/12/16 0544  09/12/16 0820      Medications:  Scheduled: . darunavir-cobicistat  1 tablet Oral Daily  . dolutegravir  50 mg Oral Daily  . emtricitabine-tenofovir AF  1 tablet Oral Daily  . feeding supplement (ENSURE ENLIVE)  237 mL Oral BID BM  . folic acid  1 mg Oral Daily  . heparin  5,000 Units Subcutaneous Q8H  . multivitamin with minerals  1 tablet Oral Daily  . nicotine  14 mg Transdermal Daily  . piperacillin-tazobactam (ZOSYN)  IV  3.375 g Intravenous Q8H  . thiamine  100 mg Oral Daily    Objective: Vital signs in last 24 hours: Temp:  [98.2 F (36.8 C)-98.6 F (37 C)] 98.6 F (37 C) (09/24 1318) Pulse Rate:  [65-70] 65 (09/24 1318) Resp:  [13-19] 13 (09/24 1318) BP: (107-115)/(65-75) 107/65 (09/24 1318) SpO2:  [99 %-100 %] 99 % (09/24 1318)   General appearance: alert, cooperative and no distress Throat: no change in oral ulcers on roof of mouth (larger due to surgery). lean wound on R floor of mouth.  Resp: clear to auscultation bilaterally Cardio: regular rate and rhythm GI: normal findings: bowel sounds normal and soft, non-tender Skin: foliculitis. scarring.   Lab Results  Recent Labs  09/16/16 0556 09/17/16 0511  WBC 3.0* 4.4  HGB 11.4* 10.8*  HCT 34.2* 33.3*  NA 136 138  K 3.4* 3.9  CL 101 103  CO2 29 29  BUN 7 6  CREATININE 1.16 1.03   Liver Panel No results for input(s): PROT, ALBUMIN, AST, ALT, ALKPHOS, BILITOT, BILIDIR, IBILI in the last 72 hours. Sedimentation Rate No results for input(s): ESRSEDRATE in the last 72 hours. C-Reactive Protein No results for input(s): CRP in the last 72 hours.  Microbiology: Recent Results (from the past 240 hour(s))  Rapid strep screen     Status: None   Collection Time: 09/11/16 11:25 PM  Result Value Ref Range Status   Streptococcus, Group A Screen (Direct) NEGATIVE NEGATIVE Final    Comment: (NOTE) A Rapid Antigen test may result negative if the antigen level in the sample is below the detection level of  this test. The FDA has not cleared this test as a stand-alone test therefore the rapid antigen negative result has reflexed to a Group A Strep culture.   Culture, group A strep     Status: None   Collection Time: 09/11/16 11:25 PM  Result Value Ref Range Status   Specimen Description THROAT  Final   Special Requests NONE Reflexed from [id number]  Final   Culture NO GROUP A STREP (S.PYOGENES) ISOLATED  Final   Report Status 09/14/2016 FINAL  Final  Surgical pcr screen     Status: None   Collection Time: 09/15/16  9:36 PM  Result Value Ref Range Status   MRSA, PCR NEGATIVE NEGATIVE Final   Staphylococcus aureus NEGATIVE NEGATIVE Final    Comment:        The Xpert SA Assay (FDA approved for NASAL specimens in patients over 33 years of age), is one component of a comprehensive surveillance program.  Test performance has been validated by [facility] for patients greater than or equal to 15 year old. It is not intended to diagnose infection nor to guide or monitor treatment.   Aerobic/Anaerobic Culture (surgical/deep wound)     Status: None (Preliminary result)   Collection Time: 09/16/16 10:49 AM  Result Value Ref Range Status   Specimen Description ABSCESS ORAL  Final   Special Requests NONE  Final   Gram Stain   Final    RARE WBC PRESENT,BOTH PMN AND MONONUCLEAR RARE GRAM VARIABLE ROD RARE GRAM POSITIVE COCCI IN CHAINS    Culture CULTURE REINCUBATED FOR BETTER GROWTH  Final   Report Status PENDING  Incomplete    Studies/Results: Ct Soft Tissue Neck W Contrast  Result Date: 09/15/2016 CLINICAL DATA:  Right floor of mouth infection and multiple oral ulcers. Follow-up after 2 days of IV antibiotics. EXAM: CT NECK WITH CONTRAST TECHNIQUE: Multidetector CT imaging of the neck was performed using the standard protocol following the bolus administration of intravenous contrast. CONTRAST:  75mL ISOVUE-300 IOPAMIDOL (ISOVUE-300) INJECTION 61% COMPARISON:  09/12/2016 FINDINGS: Pharynx  and larynx: Fluid collection in the right floor of mouth has mildly increased in size, now measuring 2.2 x 1.6 x 2.0 cm (series 3, image 49). Soft tissue ulceration at the posterior margin of the hard palate is again noted, with less gas than on the prior study. Inflammatory changes in the soft palate, left lateral nasopharynx, and upper left lateral oropharynx have progressed from the prior study, and there is now the appearance of more organized though complex fluid collections in these regions. This extends over a maximal area of approximately 3.9 x 2.9  cm on axial images (series 3, image 28) with extension to the midline of the soft palate. The airway remains patent. Larynx is unremarkable. No retropharyngeal fluid collection. Salivary glands: Submandibular and parotid glands are unremarkable. Thyroid: Unremarkable. Lymph nodes: Numerous subcentimeter short axis cervical lymph nodes are again seen bilaterally, similar to the prior study and likely reactive. Vascular: Major vascular structures of the neck appear patent. Limited intracranial: Unremarkable. Visualized orbits: Unremarkable. Mastoids and visualized paranasal sinuses: Mild mucosal thickening and small volume fluid in the sphenoid sinuses, increased from prior. Trace bilateral maxillary sinus mucosal thickening. Clear mastoid air cells. Skeleton: No acute abnormality or suspicious lytic or blastic osseous lesion identified. Upper chest: Similar appearance of scattered ground-glass and tree-in-bud nodular densities in both upper lobes. Other: None. IMPRESSION: 1. Evidence of worsening infection with mildly increased size of right floor of mouth abscess and development of new organized abscesses and phlegmon involving the soft palate and left upper pharynx. These results will be called to the ordering clinician or representative by the Radiologist Assistant, and communication documented in the PACS or zVision Dashboard. Electronically Signed   By: Sebastian AcheAllen   Grady M.D.   On: 09/15/2016 18:04     Assessment/Plan: Submandibular abscess Palatal ulcers AIDS Rash  Total days of antibiotics: 3 zosyn Prezcobix, descovy, tivicay  Await Cx and path from his oral surgery Continue zosyn for now.       Will watch his rash.  Hopefully not sulfa reaction to darunavir.  More likely subtle IRIS    Will add diflucan for thrush.    Johny SaxJeffrey Tara Wich Infectious Diseases (pager) [phone] [email or web address] 09/17/2016, 2:27 PM  LOS: 5 days

## 2016-09-18 ENCOUNTER — Encounter (HOSPITAL_COMMUNITY): Payer: Self-pay | Admitting: Otolaryngology

## 2016-09-18 LAB — CBC
HCT: 35.3 % — ABNORMAL LOW (ref 39.0–52.0)
HEMOGLOBIN: 11.8 g/dL — AB (ref 13.0–17.0)
MCH: 31.3 pg (ref 26.0–34.0)
MCHC: 33.4 g/dL (ref 30.0–36.0)
MCV: 93.6 fL (ref 78.0–100.0)
PLATELETS: 416 10*3/uL — AB (ref 150–400)
RBC: 3.77 MIL/uL — ABNORMAL LOW (ref 4.22–5.81)
RDW: 13.7 % (ref 11.5–15.5)
WBC: 3.5 10*3/uL — AB (ref 4.0–10.5)

## 2016-09-18 MED ORDER — SODIUM CHLORIDE 0.9 % IV SOLN
200.0000 mg | Freq: Once | INTRAVENOUS | Status: AC
  Administered 2016-09-18: 200 mg via INTRAVENOUS
  Filled 2016-09-18: qty 200

## 2016-09-18 MED ORDER — HYDROMORPHONE HCL 1 MG/ML IJ SOLN: 0.2500 mg | INTRAMUSCULAR | Status: DC | PRN

## 2016-09-18 MED ORDER — DIPHENHYDRAMINE HCL 12.5 MG/5ML PO ELIX
12.5000 mg | ORAL_SOLUTION | Freq: Four times a day (QID) | ORAL | Status: DC | PRN
  Administered 2016-09-18 – 2016-09-20 (×2): 12.5 mg via ORAL
  Filled 2016-09-18 (×2): qty 10

## 2016-09-18 MED ORDER — DARUNAVIR-COBICISTAT 800-150 MG PO TABS
1.0000 | ORAL_TABLET | Freq: Every day | ORAL | Status: DC
  Administered 2016-09-19 – 2016-09-21 (×3): 1 via ORAL
  Filled 2016-09-18 (×3): qty 1

## 2016-09-18 MED ORDER — SODIUM CHLORIDE 0.9 % IV SOLN
100.0000 mg | Freq: Once | INTRAVENOUS | Status: AC
  Administered 2016-09-19: 100 mg via INTRAVENOUS
  Filled 2016-09-18: qty 100

## 2016-09-18 NOTE — Progress Notes (Signed)
PROGRESS NOTE  Justin Ball  ZOX:[id number] DOB: [birth year]  DOA: 09/12/2016 PCP: No primary care provider on file.   Brief Narrative:  25 year old male with history of HIV/AIDS, noncompliant with medications, bipolar disorder, polysubstance abuse, presented to [facility] ED on 09/12/16 with 2 weeks history of sore throat, sinus pressure, eye drainage, painful oral lesions and concern for oral abscess. ENT and ID consulted.   Assessment & Plan:   Principal Problem:   Ulcer of soft palate Active Problems:   Bipolar disorder (HCC)   H/O noncompliance with medical treatment - finincial, presenting hazards to health   AIDS (HCC)   Acute hypokalemia   Anemia   Alcohol use (HCC)   Oral abscess   Right Submandibular abscess and multiple oral ulcers - ENT and ID were consulted. Patient was initially treated with IV Zosyn for approximately 48 hours. CT was repeated which showed enlarging abscess. He was taken to the OR on 9/23 and underwent I&D of right submandibular abscess and a large amount of purulent fluid was drained and the palatal ulcers were biopsied-results pending - Improving. Follow culture -results pending and pathology results-pending. 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 Discussed with ENT who indicated that he likely had it from before but was made because after biopsy of palatal ulcer. No change in management at this time. Await results of ulcer biopsy. If needed, in the future this can be closed by sutures.   HIV/AIDS - Continue ART per ID. - CD4: 20. - Patient complaining of pruritus +/- rash: As per ID follow-up, hopefully not sulfa reaction to darunavir. More likely subtle IRIS  Noncompliance - Discussed extensively with patient. He indicates that he does not get time off from his work to keep up physician appointments.  Hypokalemia - Replaced.  Bipolar disorder - not on medications pta.  Alcohol use - No withdrawal  Anemia - May be dilutional. Low serum  folate >continue at discharge. Stable.   Leukopenia - Seems to be a chronic problem for the patient. Monitor closely with follow-up CBCs due to being on antibiotics which may also cause it.  Tobacco abuse - Cessation counseled. Nicotine patch started upon request.    DVT prophylaxis: Heparin Code Status: Full Family Communication: None at bedside. Disposition Plan: DC home when medically improved   Consultants:   ID  ENT  Procedures:   Incision and drainage of right-sided floor of mouth abscess and biopsy of roof of mouth ulcers on 09/16/16.  Antimicrobials:   IV Zosyn    Subjective: Did not complain of much mouth pain. Did complain that when he drank apple juice, it came out of his nose.  Objective:  Vitals:   09/17/16 1318 09/17/16 2346 09/18/16 0603 09/18/16 1250  BP: 107/65 122/82 109/68 124/70  Pulse: 65 72 (!) 59 87  Resp: 13 18 18 20   Temp: 98.6 F (37 C) 98.9 F (37.2 C) 98.2 F (36.8 C) 98.9 F (37.2 C)  TempSrc: Oral Oral Oral Oral  SpO2: 99% 100% 98%   Weight:      Height:      Temperature 97.32F.  Intake/Output Summary (Last 24 hours) at 09/18/16 1252 Last data filed at 09/17/16 1700  Gross per 24 hour  Intake               50 ml  Output                0 ml  Net  50 ml   Filed Weights   09/11/16 2316  Weight: 67 kg (147 lb 12.8 oz)    Examination:  General exam: Pleasant young male sitting up comfortably in the bed eating breakfast. Does not appear septic or toxic. ENT: 2 Palatal ulcers ulcers without acute findings. Mild tenderness at R floor of mouth. Respiratory system: Clear to auscultation. Respiratory effort normal. Cardiovascular system: S1 & S2 heard, RRR. No JVD, murmurs, rubs, gallops or clicks. No pedal edema. Gastrointestinal system: Abdomen is nondistended, soft and nontender. No organomegaly or masses felt. Normal bowel sounds heard. Central nervous system: Alert and oriented. No focal neurological  deficits. Extremities: Symmetric 5 x 5 power. Skin: No rashes, lesions or ulcers Psychiatry: Judgement and insight appear normal. Mood & affect appropriate.     Data Reviewed: I have personally reviewed following labs and imaging studies  CBC:  Recent Labs Lab 09/12/16 0315 09/14/16 0451 09/15/16 0618 09/16/16 0556 09/17/16 0511 09/18/16 0546  WBC 4.9 4.8 3.5* 3.0* 4.4 3.5*  NEUTROABS 3.9  --   --   --   --   --   HGB 12.0* 11.1* 11.4* 11.4* 10.8* 11.8*  HCT 35.7* 34.5* 34.0* 34.2* 33.3* 35.3*  MCV 92.7 94.3 91.9 93.7 94.3 93.6  PLT 261 313 345 381 427* 416*   Basic Metabolic Panel:  Recent Labs Lab 09/12/16 0315 09/12/16 1251 09/14/16 0451 09/16/16 0556 09/17/16 0511  NA 139  --  137 136 138  K 3.4*  --  3.9 3.4* 3.9  CL 107  --  105 101 103  CO2 24  --  24 29 29   GLUCOSE 78  --  79 81 85  BUN 13  --  <5* 7 6  CREATININE 0.92  --  0.89 1.16 1.03  CALCIUM 8.7*  --  8.9 8.7* 9.1  MG  --  2.0  --   --   --   PHOS  --  2.7  --   --   --    GFR: Estimated Creatinine Clearance: 103.9 mL/min (by C-G formula based on SCr of 1.03 mg/dL). Liver Function Tests: No results for input(s): AST, ALT, ALKPHOS, BILITOT, PROT, ALBUMIN in the last 168 hours. No results for input(s): LIPASE, AMYLASE in the last 168 hours. No results for input(s): AMMONIA in the last 168 hours. Coagulation Profile: No results for input(s): INR, PROTIME in the last 168 hours. Cardiac Enzymes: No results for input(s): CKTOTAL, CKMB, CKMBINDEX, TROPONINI in the last 168 hours. BNP (last 3 results) No results for input(s): PROBNP in the last 8760 hours. HbA1C: No results for input(s): HGBA1C in the last 72 hours. CBG: No results for input(s): GLUCAP in the last 168 hours. Lipid Profile: No results for input(s): CHOL, HDL, LDLCALC, TRIG, CHOLHDL, LDLDIRECT in the last 72 hours. Thyroid Function Tests: No results for input(s): TSH, T4TOTAL, FREET4, T3FREE, THYROIDAB in the last 72 hours. Anemia  Panel: No results for input(s): VITAMINB12, FOLATE, FERRITIN, TIBC, IRON, RETICCTPCT in the last 72 hours.  Sepsis Labs: No results for input(s): PROCALCITON, LATICACIDVEN in the last 168 hours.  Recent Results (from the past 240 hour(s))  Rapid strep screen     Status: None   Collection Time: 09/11/16 11:25 PM  Result Value Ref Range Status   Streptococcus, Group A Screen (Direct) NEGATIVE NEGATIVE Final    Comment: (NOTE) A Rapid Antigen test may result negative if the antigen level in the sample is below the detection level of this test. The FDA  has not cleared this test as a stand-alone test therefore the rapid antigen negative result has reflexed to a Group A Strep culture.   Culture, group A strep     Status: None   Collection Time: 09/11/16 11:25 PM  Result Value Ref Range Status   Specimen Description THROAT  Final   Special Requests NONE Reflexed from [id number]  Final   Culture NO GROUP A STREP (S.PYOGENES) ISOLATED  Final   Report Status 09/14/2016 FINAL  Final  Surgical pcr screen     Status: None   Collection Time: 09/15/16  9:36 PM  Result Value Ref Range Status   MRSA, PCR NEGATIVE NEGATIVE Final   Staphylococcus aureus NEGATIVE NEGATIVE Final    Comment:        The Xpert SA Assay (FDA approved for NASAL specimens in patients over 25 years of age), is one component of a comprehensive surveillance program.  Test performance has been validated by [facility] for patients greater than or equal to 25 year old. It is not intended to diagnose infection nor to guide or monitor treatment.   Aerobic/Anaerobic Culture (surgical/deep wound)     Status: None (Preliminary result)   Collection Time: 09/16/16 10:49 AM  Result Value Ref Range Status   Specimen Description ABSCESS ORAL  Final   Special Requests NONE  Final   Gram Stain   Final    RARE WBC PRESENT,BOTH PMN AND MONONUCLEAR RARE GRAM VARIABLE ROD RARE GRAM POSITIVE COCCI IN CHAINS    Culture CULTURE  REINCUBATED FOR BETTER GROWTH  Final   Report Status PENDING  Incomplete         Radiology Studies: No results found.      Scheduled Meds: . darunavir-cobicistat  1 tablet Oral Daily  . dolutegravir  50 mg Oral Daily  . emtricitabine-tenofovir AF  1 tablet Oral Daily  . feeding supplement (ENSURE ENLIVE)  237 mL Oral BID BM  . fluconazole  100 mg Oral Daily  . folic acid  1 mg Oral Daily  . heparin  5,000 Units Subcutaneous Q8H  . multivitamin with minerals  1 tablet Oral Daily  . nicotine  14 mg Transdermal Daily  . piperacillin-tazobactam (ZOSYN)  IV  3.375 g Intravenous Q8H  . thiamine  100 mg Oral Daily   Continuous Infusions:     LOS: 6 days    [facility] End[facility] Center IncNGALGI,ANAND, MD [facility] Pager [phone]  If 7PM-7AM, please contact night-coverage [email or web address] Password [email or web address] 09/18/2016, 12:52 PM

## 2016-09-18 NOTE — Progress Notes (Signed)
INFECTIOUS DISEASE PROGRESS NOTE  ID: Justin Ball is a 25 y.o. male with  Principal Problem:   Ulcer of soft palate Active Problems:   Bipolar disorder (HCC)   H/O noncompliance with medical treatment - finincial, presenting hazards to health   AIDS (HCC)   Acute hypokalemia   Anemia   Alcohol use (HCC)   Oral abscess  Subjective: C/o liquids going into his nose through his mouth Also c/o nausea  Abtx:  Anti-infectives    Start     Dose/Rate Route Frequency Ordered Stop   09/19/16 0800  darunavir-cobicistat (PREZCOBIX) 800-150 MG per tablet 1 tablet     1 tablet Oral Daily with breakfast 09/18/16 1327     09/17/16 1600  fluconazole (DIFLUCAN) 40 MG/ML suspension 100 mg     100 mg Oral Daily 09/17/16 1436 09/24/16 0959   09/16/16 0959  polymyxin B 500,000 Units, bacitracin 50,000 Units in sodium chloride irrigation 0.9 % 500 mL irrigation  Status:  Discontinued       As needed 09/16/16 1000 09/16/16 1028   09/13/16 1200  piperacillin-tazobactam (ZOSYN) IVPB 3.375 g  Status:  Discontinued     3.375 g 12.5 mL/hr over 240 Minutes Intravenous Every 8 hours 09/13/16 1053 09/13/16 1055   09/13/16 1200  piperacillin-tazobactam (ZOSYN) IVPB 3.375 g     3.375 g 12.5 mL/hr over 240 Minutes Intravenous Every 8 hours 09/13/16 1055     09/13/16 0800  Darunavir Ethanolate (PREZISTA) tablet 800 mg  Status:  Discontinued     800 mg Oral Daily with breakfast 09/12/16 0938 09/12/16 0939   09/13/16 0800  ritonavir ([name]) tablet 100 mg  Status:  Discontinued     100 mg Oral Daily with breakfast 09/12/16 0938 09/12/16 0939   09/12/16 1400  clindamycin (CLEOCIN) IVPB 600 mg  Status:  Discontinued     600 mg 100 mL/hr over 30 Minutes Intravenous Every 8 hours 09/12/16 0748 09/12/16 0926   09/12/16 1030  emtricitabine-tenofovir AF (DESCOVY) 200-25 MG per tablet 1 tablet     1 tablet Oral Daily 09/12/16 0938     09/12/16 1030  dolutegravir (TIVICAY) tablet 50 mg     50 mg Oral Daily  09/12/16 0938     09/12/16 1030  darunavir-cobicistat (PREZCOBIX) 800-150 MG per tablet 1 tablet  Status:  Discontinued     1 tablet Oral Daily 09/12/16 0939 09/18/16 1327   09/12/16 0545  clindamycin (CLEOCIN) IVPB 600 mg     600 mg 100 mL/hr over 30 Minutes Intravenous  Once 09/12/16 0544 09/12/16 0820      Medications:  Scheduled: . [START ON 09/19/2016] darunavir-cobicistat  1 tablet Oral Q breakfast  . dolutegravir  50 mg Oral Daily  . emtricitabine-tenofovir AF  1 tablet Oral Daily  . feeding supplement (ENSURE ENLIVE)  237 mL Oral BID BM  . fluconazole  100 mg Oral Daily  . folic acid  1 mg Oral Daily  . heparin  5,000 Units Subcutaneous Q8H  . multivitamin with minerals  1 tablet Oral Daily  . nicotine  14 mg Transdermal Daily  . piperacillin-tazobactam (ZOSYN)  IV  3.375 g Intravenous Q8H  . thiamine  100 mg Oral Daily    Objective: Vital signs in last 24 hours: Temp:  [98.2 F (36.8 C)-98.9 F (37.2 C)] 98.9 F (37.2 C) (09/25 1250) Pulse Rate:  [59-87] 87 (09/25 1250) Resp:  [18-20] 20 (09/25 1250) BP: (109-124)/(68-82) 124/70 (09/25 1250) SpO2:  [98 %-100 %]  98 % (09/25 0603)   General appearance: alert, cooperative and no distress Throat: areas on roof of mouth unchanged.  Resp: clear to auscultation bilaterally Cardio: regular rate and rhythm GI: normal findings: bowel sounds normal and soft, non-tender  Lab Results  Recent Labs  09/16/16 0556 09/17/16 0511 09/18/16 0546  WBC 3.0* 4.4 3.5*  HGB 11.4* 10.8* 11.8*  HCT 34.2* 33.3* 35.3*  NA 136 138  --   K 3.4* 3.9  --   CL 101 103  --   CO2 29 29  --   BUN 7 6  --   CREATININE 1.16 1.03  --    Liver Panel No results for input(s): PROT, ALBUMIN, AST, ALT, ALKPHOS, BILITOT, BILIDIR, IBILI in the last 72 hours. Sedimentation Rate No results for input(s): ESRSEDRATE in the last 72 hours. C-Reactive Protein No results for input(s): CRP in the last 72 hours.  Microbiology: Recent Results (from  the past 240 hour(s))  Rapid strep screen     Status: None   Collection Time: 09/11/16 11:25 PM  Result Value Ref Range Status   Streptococcus, Group A Screen (Direct) NEGATIVE NEGATIVE Final    Comment: (NOTE) A Rapid Antigen test may result negative if the antigen level in the sample is below the detection level of this test. The FDA has not cleared this test as a stand-alone test therefore the rapid antigen negative result has reflexed to a Group A Strep culture.   Culture, group A strep     Status: None   Collection Time: 09/11/16 11:25 PM  Result Value Ref Range Status   Specimen Description THROAT  Final   Special Requests NONE Reflexed from [id number]  Final   Culture NO GROUP A STREP (S.PYOGENES) ISOLATED  Final   Report Status 09/14/2016 FINAL  Final  Surgical pcr screen     Status: None   Collection Time: 09/15/16  9:36 PM  Result Value Ref Range Status   MRSA, PCR NEGATIVE NEGATIVE Final   Staphylococcus aureus NEGATIVE NEGATIVE Final    Comment:        The Xpert SA Assay (FDA approved for NASAL specimens in patients over 25 years of age), is one component of a comprehensive surveillance program.  Test performance has been validated by [facility] for patients greater than or equal to 25 year old. It is not intended to diagnose infection nor to guide or monitor treatment.   Aerobic/Anaerobic Culture (surgical/deep wound)     Status: None (Preliminary result)   Collection Time: 09/16/16 10:49 AM  Result Value Ref Range Status   Specimen Description ABSCESS ORAL  Final   Special Requests NONE  Final   Gram Stain   Final    RARE WBC PRESENT,BOTH PMN AND MONONUCLEAR RARE GRAM VARIABLE ROD RARE GRAM POSITIVE COCCI IN CHAINS    Culture   Final    RARE YEAST IDENTIFICATION TO FOLLOW NO OTHER FLORA PRESENT    Report Status PENDING  Incomplete    Studies/Results: No results found.   Assessment/Plan: Submandibular abscess Palatal ulcers AIDS Rash  Total  days of antibiotics: 4zosyn Prezcobix, descovy, tivicay  Cx shows yeast so far, will add eraxis Continue zosyn for now.  Path out in AM         BB&T CorporationJeffrey Ball Infectious Diseases (pager) [phone] [email or web address] 09/18/2016, 3:59 PM  LOS: 6 days

## 2016-09-19 MED ORDER — FLUCONAZOLE IN SODIUM CHLORIDE 400-0.9 MG/200ML-% IV SOLN
400.0000 mg | INTRAVENOUS | Status: DC
  Administered 2016-09-20: 400 mg via INTRAVENOUS
  Filled 2016-09-19: qty 200

## 2016-09-19 MED FILL — Human Papillomavirus (HPV) Quadrivalent Recombinant Vac Inj: INTRAMUSCULAR | Qty: 0.5 | Status: AC

## 2016-09-19 NOTE — Progress Notes (Signed)
PROGRESS NOTE  Justin Ball  ZOX:[id number] DOB: 05/02/1991  DOA: 09/12/2016 PCP: No primary care provider on file.   Brief Narrative:  25 year old male with history of HIV/AIDS, noncompliant with medications, bipolar disorder, polysubstance abuse, presented to [facility] ED on 09/12/16 with 2 weeks history of sore throat, sinus pressure, eye drainage, painful oral lesions and concern for oral abscess. ENT and ID consulted. Initially treated with broad-spectrum IV antibiotics. Clinically and radiologically right submandibular abscess got worse. He underwent incision and drainage of abscess and biopsy of palatal ulcer by ENT. Slowly improving.   Assessment & Plan:   Principal Problem:   Ulcer of soft palate Active Problems:   Bipolar disorder (HCC)   H/O noncompliance with medical treatment - finincial, presenting hazards to health   AIDS (HCC)   Acute hypokalemia   Anemia   Alcohol use (HCC)   Oral abscess   Right Submandibular abscess and multiple oral ulcers - ENT and ID were consulted. Patient was initially treated with IV Zosyn for approximately 48 hours. CT was repeated which showed enlarging abscess. He was taken to the OR on 9/23 and underwent I&D of right submandibular abscess and a large amount of purulent fluid was drained and the palatal ulcers were biopsied-as per ID, culture shows Candida albicans and changing to Diflucan in a.m. ID discussed with pathology >no cancer. Special stains in a.m. 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 Discussed with ENT 9/25 who indicated that he likely had it from before but was made because after biopsy of palatal ulcer. No change in management at this time. Await results of ulcer biopsy. If needed, in the future this can be closed by sutures.  - Improving.  HIV/AIDS - Continue ART per ID. - CD4: 20. - Patient complaining of pruritus +/- rash: As per ID follow-up, hopefully not sulfa reaction to darunavir. More likely subtle  IRIS  Noncompliance - Discussed extensively with patient. He indicates that he does not get time off from his work to keep up physician appointments.  Hypokalemia - Replaced.  Bipolar disorder - not on medications pta.  Alcohol use - No withdrawal  Anemia - May be dilutional. Low serum folate >continue at discharge. Stable.   Leukopenia - Seems to be a chronic problem for the patient. Monitor closely with follow-up CBCs due to being on antibiotics which may also cause it.  Tobacco abuse - Cessation counseled. Nicotine patch started upon request.    DVT prophylaxis: Heparin Code Status: Full Family Communication: None at bedside. Disposition Plan: DC home when medically improved   Consultants:   ID  ENT  Procedures:   Incision and drainage of right-sided floor of mouth abscess and biopsy of roof of mouth ulcers on 09/16/16.  Antimicrobials:   IV Zosyn    Subjective: Mild oral pain but improving. Only liquids, through the nose and not solid food. No other complaints reported.  Objective:  Vitals:   09/18/16 0603 09/18/16 1250 09/18/16 2056 09/19/16 0519  BP: 109/68 124/70 103/66 111/66  Pulse: (!) 59 87 76 64  Resp: 18 20 18 18   Temp: 98.2 F (36.8 C) 98.9 F (37.2 C) 98.2 F (36.8 C) 98.7 F (37.1 C)  TempSrc: Oral Oral Oral Oral  SpO2: 98%  100% 100%  Weight:      Height:        Intake/Output Summary (Last 24 hours) at 09/19/16 0733 Last data filed at 09/19/16 0654  Gross per 24 hour  Intake  460 ml  Output              600 ml  Net             -140 ml   Filed Weights   09/11/16 2316  Weight: 67 kg (147 lb 12.8 oz)    Examination:  General exam: Pleasant young male sitting up comfortably in the bed. Does not appear septic or toxic. ENT: 2 Palatal ulcers ulcers without acute findings. Mild tenderness at R floor of mouth. Respiratory system: Clear to auscultation. Respiratory effort normal. Cardiovascular system: S1 & S2  heard, RRR. No JVD, murmurs, rubs, gallops or clicks. No pedal edema. Gastrointestinal system: Abdomen is nondistended, soft and nontender. No organomegaly or masses felt. Normal bowel sounds heard. Central nervous system: Alert and oriented. No focal neurological deficits. Extremities: Symmetric 5 x 5 power. Skin: No rashes, lesions or ulcers Psychiatry: Judgement and insight appear normal. Mood & affect appropriate.     Data Reviewed: I have personally reviewed following labs and imaging studies  CBC:  Recent Labs Lab 09/14/16 0451 09/15/16 0618 09/16/16 0556 09/17/16 0511 09/18/16 0546  WBC 4.8 3.5* 3.0* 4.4 3.5*  HGB 11.1* 11.4* 11.4* 10.8* 11.8*  HCT 34.5* 34.0* 34.2* 33.3* 35.3*  MCV 94.3 91.9 93.7 94.3 93.6  PLT 313 345 381 427* 416*   Basic Metabolic Panel:  Recent Labs Lab 09/12/16 1251 09/14/16 0451 09/16/16 0556 09/17/16 0511  NA  --  137 136 138  K  --  3.9 3.4* 3.9  CL  --  105 101 103  CO2  --  24 29 29   GLUCOSE  --  79 81 85  BUN  --  <5* 7 6  CREATININE  --  0.89 1.16 1.03  CALCIUM  --  8.9 8.7* 9.1  MG 2.0  --   --   --   PHOS 2.7  --   --   --    GFR: Estimated Creatinine Clearance: 103.9 mL/min (by C-G formula based on SCr of 1.03 mg/dL). Liver Function Tests: No results for input(s): AST, ALT, ALKPHOS, BILITOT, PROT, ALBUMIN in the last 168 hours. No results for input(s): LIPASE, AMYLASE in the last 168 hours. No results for input(s): AMMONIA in the last 168 hours. Coagulation Profile: No results for input(s): INR, PROTIME in the last 168 hours. Cardiac Enzymes: No results for input(s): CKTOTAL, CKMB, CKMBINDEX, TROPONINI in the last 168 hours. BNP (last 3 results) No results for input(s): PROBNP in the last 8760 hours. HbA1C: No results for input(s): HGBA1C in the last 72 hours. CBG: No results for input(s): GLUCAP in the last 168 hours. Lipid Profile: No results for input(s): CHOL, HDL, LDLCALC, TRIG, CHOLHDL, LDLDIRECT in the last  72 hours. Thyroid Function Tests: No results for input(s): TSH, T4TOTAL, FREET4, T3FREE, THYROIDAB in the last 72 hours. Anemia Panel: No results for input(s): VITAMINB12, FOLATE, FERRITIN, TIBC, IRON, RETICCTPCT in the last 72 hours.  Sepsis Labs: No results for input(s): PROCALCITON, LATICACIDVEN in the last 168 hours.  Recent Results (from the past 240 hour(s))  Rapid strep screen     Status: None   Collection Time: 09/11/16 11:25 PM  Result Value Ref Range Status   Streptococcus, Group A Screen (Direct) NEGATIVE NEGATIVE Final    Comment: (NOTE) A Rapid Antigen test may result negative if the antigen level in the sample is below the detection level of this test. The FDA has not cleared this test as a stand-alone test therefore  the rapid antigen negative result has reflexed to a Group A Strep culture.   Culture, group A strep     Status: None   Collection Time: 09/11/16 11:25 PM  Result Value Ref Range Status   Specimen Description THROAT  Final   Special Requests NONE Reflexed from [id number]  Final   Culture NO GROUP A STREP (S.PYOGENES) ISOLATED  Final   Report Status 09/14/2016 FINAL  Final  Surgical pcr screen     Status: None   Collection Time: 09/15/16  9:36 PM  Result Value Ref Range Status   MRSA, PCR NEGATIVE NEGATIVE Final   Staphylococcus aureus NEGATIVE NEGATIVE Final    Comment:        The Xpert SA Assay (FDA approved for NASAL specimens in patients over 35 years of age), is one component of a comprehensive surveillance program.  Test performance has been validated by [facility] for patients greater than or equal to 74 year old. It is not intended to diagnose infection nor to guide or monitor treatment.   Aerobic/Anaerobic Culture (surgical/deep wound)     Status: None (Preliminary result)   Collection Time: 09/16/16 10:49 AM  Result Value Ref Range Status   Specimen Description ABSCESS ORAL  Final   Special Requests NONE  Final   Gram Stain   Final     RARE WBC PRESENT,BOTH PMN AND MONONUCLEAR RARE GRAM VARIABLE ROD RARE GRAM POSITIVE COCCI IN CHAINS    Culture   Final    RARE YEAST IDENTIFICATION TO FOLLOW NO OTHER FLORA PRESENT    Report Status PENDING  Incomplete         Radiology Studies: No results found.      Scheduled Meds: . darunavir-cobicistat  1 tablet Oral Q breakfast  . dolutegravir  50 mg Oral Daily  . emtricitabine-tenofovir AF  1 tablet Oral Daily  . feeding supplement (ENSURE ENLIVE)  237 mL Oral BID BM  . folic acid  1 mg Oral Daily  . heparin  5,000 Units Subcutaneous Q8H  . multivitamin with minerals  1 tablet Oral Daily  . nicotine  14 mg Transdermal Daily  . piperacillin-tazobactam (ZOSYN)  IV  3.375 g Intravenous Q8H  . thiamine  100 mg Oral Daily   Continuous Infusions:     LOS: 7 days    [facility], MD [facility] Pager [phone]  If 7PM-7AM, please contact night-coverage [email or web address] Password [facility] 09/19/2016, 7:33 AM

## 2016-09-19 NOTE — Progress Notes (Signed)
INFECTIOUS DISEASE PROGRESS NOTE  ID: Justin Ball is a 25 y.o. male with  Principal Problem:   Ulcer of soft palate Active Problems:   Bipolar disorder (HCC)   H/O noncompliance with medical treatment - finincial, presenting hazards to health   AIDS (HCC)   Acute hypokalemia   Anemia   Alcohol use (HCC)   Oral abscess  Subjective: No complaints  Denies dysphagia  Abtx:  Anti-infectives    Start     Dose/Rate Route Frequency Ordered Stop   09/19/16 0800  darunavir-cobicistat (PREZCOBIX) 800-150 MG per tablet 1 tablet     1 tablet Oral Daily with breakfast 09/18/16 1327     09/19/16 0000  anidulafungin (ERAXIS) 100 mg in sodium chloride 0.9 % 100 mL IVPB     100 mg over 90 Minutes Intravenous  Once 09/18/16 1602 09/19/16 0216   09/18/16 1615  anidulafungin (ERAXIS) 200 mg in sodium chloride 0.9 % 200 mL IVPB     200 mg over 180 Minutes Intravenous  Once 09/18/16 1602 09/18/16 2003   09/17/16 1600  fluconazole (DIFLUCAN) 40 MG/ML suspension 100 mg  Status:  Discontinued     100 mg Oral Daily 09/17/16 1436 09/18/16 1602   09/16/16 0959  polymyxin B 500,000 Units, bacitracin 50,000 Units in sodium chloride irrigation 0.9 % 500 mL irrigation  Status:  Discontinued       As needed 09/16/16 1000 09/16/16 1028   09/13/16 1200  piperacillin-tazobactam (ZOSYN) IVPB 3.375 g  Status:  Discontinued     3.375 g 12.5 mL/hr over 240 Minutes Intravenous Every 8 hours 09/13/16 1053 09/13/16 1055   09/13/16 1200  piperacillin-tazobactam (ZOSYN) IVPB 3.375 g     3.375 g 12.5 mL/hr over 240 Minutes Intravenous Every 8 hours 09/13/16 1055     09/13/16 0800  Darunavir Ethanolate (PREZISTA) tablet 800 mg  Status:  Discontinued     800 mg Oral Daily with breakfast 09/12/16 0938 09/12/16 0939   09/13/16 0800  ritonavir ([name]) tablet 100 mg  Status:  Discontinued     100 mg Oral Daily with breakfast 09/12/16 0938 09/12/16 0939   09/12/16 1400  clindamycin (CLEOCIN) IVPB 600 mg  Status:   Discontinued     600 mg 100 mL/hr over 30 Minutes Intravenous Every 8 hours 09/12/16 0748 09/12/16 0926   09/12/16 1030  emtricitabine-tenofovir AF (DESCOVY) 200-25 MG per tablet 1 tablet     1 tablet Oral Daily 09/12/16 0938     09/12/16 1030  dolutegravir (TIVICAY) tablet 50 mg     50 mg Oral Daily 09/12/16 0938     09/12/16 1030  darunavir-cobicistat (PREZCOBIX) 800-150 MG per tablet 1 tablet  Status:  Discontinued     1 tablet Oral Daily 09/12/16 0939 09/18/16 1327   09/12/16 0545  clindamycin (CLEOCIN) IVPB 600 mg     600 mg 100 mL/hr over 30 Minutes Intravenous  Once 09/12/16 0544 09/12/16 0820      Medications:  Scheduled: . darunavir-cobicistat  1 tablet Oral Q breakfast  . dolutegravir  50 mg Oral Daily  . emtricitabine-tenofovir AF  1 tablet Oral Daily  . feeding supplement (ENSURE ENLIVE)  237 mL Oral BID BM  . folic acid  1 mg Oral Daily  . heparin  5,000 Units Subcutaneous Q8H  . multivitamin with minerals  1 tablet Oral Daily  . nicotine  14 mg Transdermal Daily  . piperacillin-tazobactam (ZOSYN)  IV  3.375 g Intravenous Q8H  . thiamine  100 mg Oral Daily    Objective: Vital signs in last 24 hours: Temp:  [98.2 F (36.8 C)-98.9 F (37.2 C)] 98.7 F (37.1 C) (09/26 0519) Pulse Rate:  [64-87] 64 (09/26 0519) Resp:  [18-20] 18 (09/26 0519) BP: (103-124)/(66-70) 111/66 (09/26 0519) SpO2:  [100 %] 100 % (09/26 0519)   General appearance: alert, cooperative and no distress Throat: less inflmmation at wounds on roof of mouth.   No swelling under tongue.   Lab Results  Recent Labs  09/17/16 0511 09/18/16 0546  WBC 4.4 3.5*  HGB 10.8* 11.8*  HCT 33.3* 35.3*  NA 138  --   K 3.9  --   CL 103  --   CO2 29  --   BUN 6  --   CREATININE 1.03  --    Liver Panel No results for input(s): PROT, ALBUMIN, AST, ALT, ALKPHOS, BILITOT, BILIDIR, IBILI in the last 72 hours. Sedimentation Rate No results for input(s): ESRSEDRATE in the last 72 hours. C-Reactive  Protein No results for input(s): CRP in the last 72 hours.  Microbiology: Recent Results (from the past 240 hour(s))  Rapid strep screen     Status: None   Collection Time: 09/11/16 11:25 PM  Result Value Ref Range Status   Streptococcus, Group A Screen (Direct) NEGATIVE NEGATIVE Final    Comment: (NOTE) A Rapid Antigen test may result negative if the antigen level in the sample is below the detection level of this test. The FDA has not cleared this test as a stand-alone test therefore the rapid antigen negative result has reflexed to a Group A Strep culture.   Culture, group A strep     Status: None   Collection Time: 09/11/16 11:25 PM  Result Value Ref Range Status   Specimen Description THROAT  Final   Special Requests NONE Reflexed from [id number]  Final   Culture NO GROUP A STREP (S.PYOGENES) ISOLATED  Final   Report Status 09/14/2016 FINAL  Final  Surgical pcr screen     Status: None   Collection Time: 09/15/16  9:36 PM  Result Value Ref Range Status   MRSA, PCR NEGATIVE NEGATIVE Final   Staphylococcus aureus NEGATIVE NEGATIVE Final    Comment:        The Xpert SA Assay (FDA approved for NASAL specimens in patients over 25 years of age), is one component of a comprehensive surveillance program.  Test performance has been validated by [facility] for patients greater than or equal to 25 year old. It is not intended to diagnose infection nor to guide or monitor treatment.   Aerobic/Anaerobic Culture (surgical/deep wound)     Status: None (Preliminary result)   Collection Time: 09/16/16 10:49 AM  Result Value Ref Range Status   Specimen Description ABSCESS ORAL  Final   Special Requests NONE  Final   Gram Stain   Final    RARE WBC PRESENT,BOTH PMN AND MONONUCLEAR RARE GRAM VARIABLE ROD RARE GRAM POSITIVE COCCI IN CHAINS    Culture RARE CANDIDA ALBICANS NO OTHER FLORA PRESENT   Final   Report Status PENDING  Incomplete    Studies/Results: No results  found.   Assessment/Plan: Submandibular abscess Palatal ulcers AIDS Rash  Total days of antibiotics: 4zosyn Prezcobix, descovy, tivicay  Cx shows C albicans, will change to Diflucan in AM (has gotten todays eraxis dose per Epic) Continue zosyn for now.  Spoke with Path- special stains in AM. No cancer.  Justin Ball Infectious Diseases (pager) [phone] [email or web address] 09/19/2016, 12:20 PM  LOS: 7 days

## 2016-09-20 MED ORDER — BOOST / RESOURCE BREEZE PO LIQD
1.0000 | Freq: Three times a day (TID) | ORAL | Status: DC
  Administered 2016-09-20 – 2016-09-21 (×3): 1 via ORAL

## 2016-09-20 MED ORDER — AMOXICILLIN-POT CLAVULANATE 875-125 MG PO TABS: 1.0000 | ORAL_TABLET | Freq: Two times a day (BID) | ORAL | 0 refills | Status: DC

## 2016-09-20 MED ORDER — AMOXICILLIN-POT CLAVULANATE 875-125 MG PO TABS
1.0000 | ORAL_TABLET | Freq: Two times a day (BID) | ORAL | Status: DC
  Administered 2016-09-20 – 2016-09-21 (×2): 1 via ORAL
  Filled 2016-09-20 (×3): qty 1

## 2016-09-20 MED ORDER — FLUCONAZOLE 200 MG PO TABS: 400.0000 mg | ORAL_TABLET | Freq: Every day | ORAL | 0 refills | Status: DC

## 2016-09-20 MED ORDER — FLUCONAZOLE 100 MG PO TABS
400.0000 mg | ORAL_TABLET | Freq: Every day | ORAL | Status: DC
  Administered 2016-09-21: 400 mg via ORAL
  Filled 2016-09-20: qty 4

## 2016-09-20 NOTE — Progress Notes (Signed)
PROGRESS NOTE  Justin Ball RUE:[id number] DOB: Apr 29, 1991 DOA: 09/12/2016 PCP: No primary care provider on file.  Brief History:  25 year old male with history of HIV/AIDS, noncompliant with medications, bipolar disorder, polysubstance abuse, presented to [facility] ED on 09/12/16 with 2 weeks history of sore throat, sinus pressure, eye drainage, painful oral lesions and concern for oral abscess. ENT and ID consulted. Initially treated with broad-spectrum IV antibiotics. Clinically and radiologically right submandibular abscess got worse. He underwent incision and drainage of abscess and biopsy of palatal ulcer by ENT. Slowly improving.  Assessment/Plan: Right Submandibular abscess and multiple oral ulcers - ENT and ID were consulted. Patient was initially treated with IV Zosyn for approximately 48 hours. CT was repeated which showed enlarging abscess. He was taken to the OR on 9/23 and underwent I&D of right submandibular abscess and a large amount of purulent fluid was drained and the palatal ulcers were biopsied-as per ID, culture shows Candida albicans and changing to Diflucan -ID discussed with pathology >no cancer. 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 Discussed with ENT 9/25 who indicated that he likely had it from before but was made because after biopsy of palatal ulcer. No change in management at this time. Await results of ulcer biopsy. If needed, in the future this can be closed by sutures.  - Improving.  HIV/AIDS - Continue ART per ID. - CD4: 20. - Patient complaining of pruritus +/- rash: As per ID follow-up, hopefully not sulfa reaction to darunavir. More likely subtle IRIS  Noncompliance - Discussed extensively with patient. He indicates that he does not get time off from his work to keep up physician appointments.  Hypokalemia - Replaced.  Bipolar  disorder - not on medications pta.  Alcohol use - No withdrawal  Anemia - May be dilutional. Low serum folate >continue at discharge. Stable.   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 Nicotine patch started upon request.    DVT prophylaxis: Heparin [place] Code Status: Full Family Communication: None at bedside. Disposition Plan: DC home 9/28 once paperwork done for HIV meds   Consultants:   ID  ENT  Procedures:   Incision and drainage of right-sided floor of mouth abscess and biopsy of roof of mouth ulcers on 09/16/16.  Antimicrobials:   IV Zosyn           Subjective: Patient denies fevers, chills, headache, chest pain, dyspnea, nausea, vomiting, diarrhea, abdominal pain, dysuria, hematuria, hematochezia, and melena.   Objective: Vitals:   09/19/16 1347 09/20/16 0011 09/20/16 0533 09/20/16 1435  BP: (!) 96/58 107/69 108/70 108/70  Pulse: 68 75 73 66  Resp: 15 18 18 13   Temp: 98.2 F (36.8 C) 98.3 F (36.8 C) 98.2 F (36.8 C) 98.3 F (36.8 C)  TempSrc: Oral Oral Oral Oral  SpO2: 98% 99% 97% 98%  Weight:      Height:        Intake/Output Summary (Last 24 hours) at 09/20/16 1844 Last data filed at 09/20/16 0346  Gross per 24 hour  Intake              390 ml  Output                0 ml  Net  390 ml   Weight change:  Exam:   General:  Pt is alert, follows commands appropriately, not in acute distress  HEENT: No icterus, No thrush, No neck mass, [place]/AT  Cardiovascular: RRR, S1/S2, no rubs, no gallops  Respiratory: CTA bilaterally, no wheezing, no crackles, no rhonchi  Abdomen: Soft/+BS, non tender, non distended, no guarding  Extremities: No edema, No lymphangitis, No petechiae, No rashes, no synovitis   Data Reviewed: I have personally reviewed following labs and imaging studies Basic Metabolic Panel:  Recent Labs Lab 09/14/16 0451  09/16/16 0556 09/17/16 0511  NA 137 136 138  K 3.9 3.4* 3.9  CL 105 101 103  CO2 24 29 29   GLUCOSE 79 81 85  BUN <5* 7 6  CREATININE 0.89 1.16 1.03  CALCIUM 8.9 8.7* 9.1   Liver Function Tests: No results for input(s): AST, ALT, ALKPHOS, BILITOT, PROT, ALBUMIN in the last 168 hours. No results for input(s): LIPASE, AMYLASE in the last 168 hours. No results for input(s): AMMONIA in the last 168 hours. Coagulation Profile: No results for input(s): INR, PROTIME in the last 168 hours. CBC:  Recent Labs Lab 09/14/16 0451 09/15/16 0618 09/16/16 0556 09/17/16 0511 09/18/16 0546  WBC 4.8 3.5* 3.0* 4.4 3.5*  HGB 11.1* 11.4* 11.4* 10.8* 11.8*  HCT 34.5* 34.0* 34.2* 33.3* 35.3*  MCV 94.3 91.9 93.7 94.3 93.6  PLT 313 345 381 427* 416*   Cardiac Enzymes: No results for input(s): CKTOTAL, CKMB, CKMBINDEX, TROPONINI in the last 168 hours. BNP: Invalid input(s): POCBNP CBG: No results for input(s): GLUCAP in the last 168 hours. HbA1C: No results for input(s): HGBA1C in the last 72 hours. Urine analysis:    Component Value Date/Time   COLORURINE AMBER (A) 09/09/2014 1055   APPEARANCEUR CLEAR 09/09/2014 1055   LABSPEC 1.019 09/09/2014 1055   PHURINE 7.0 09/09/2014 1055   GLUCOSEU NEGATIVE 09/09/2014 1055   HGBUR NEGATIVE 09/09/2014 1055   BILIRUBINUR SMALL (A) 09/09/2014 1055   KETONESUR NEGATIVE 09/09/2014 1055   PROTEINUR NEGATIVE 09/09/2014 1055   UROBILINOGEN 4.0 (H) 09/09/2014 1055   NITRITE NEGATIVE 09/09/2014 1055   LEUKOCYTESUR NEGATIVE 09/09/2014 1055   Sepsis Labs: @LABRCNTIP (procalcitonin:4,lacticidven:4) ) Recent Results (from the past 240 hour(s))  Rapid strep screen     Status: None   Collection Time: 09/11/16 11:25 PM  Result Value Ref Range Status   Streptococcus, Group A Screen (Direct) NEGATIVE NEGATIVE Final    Comment: (NOTE) A Rapid Antigen test may result negative if the antigen level in the sample is below the detection level of this test. The  FDA has not cleared this test as a stand-alone test therefore the rapid antigen negative result has reflexed to a Group A Strep culture.   Culture, group A strep     Status: None   Collection Time: 09/11/16 11:25 PM  Result Value Ref Range Status   Specimen Description THROAT  Final   Special Requests NONE Reflexed from [id number]  Final   Culture NO GROUP A STREP (S.PYOGENES) ISOLATED  Final   Report Status 09/14/2016 FINAL  Final  Surgical pcr screen     Status: None   Collection Time: 09/15/16  9:36 PM  Result Value Ref Range Status   MRSA, PCR NEGATIVE NEGATIVE Final   Staphylococcus aureus NEGATIVE NEGATIVE Final    Comment:        The Xpert SA Assay (FDA approved for NASAL specimens in patients over 8 years of age), is one component of a comprehensive surveillance  program.  Test performance has been validated by [facility] for patients greater than or equal to 25 year old. It is not intended to diagnose infection nor to guide or monitor treatment.   Aerobic/Anaerobic Culture (surgical/deep wound)     Status: None (Preliminary result)   Collection Time: 09/16/16 10:49 AM  Result Value Ref Range Status   Specimen Description ABSCESS ORAL  Final   Special Requests NONE  Final   Gram Stain   Final    RARE WBC PRESENT,BOTH PMN AND MONONUCLEAR RARE GRAM VARIABLE ROD RARE GRAM POSITIVE COCCI IN CHAINS    Culture   Final    RARE CANDIDA ALBICANS NO OTHER FLORA PRESENT NO ANAEROBES ISOLATED; CULTURE IN PROGRESS FOR 5 DAYS    Report Status PENDING  Incomplete     Scheduled Meds: . amoxicillin-clavulanate  1 tablet Oral Q12H  . darunavir-cobicistat  1 tablet Oral Q breakfast  . dolutegravir  50 mg Oral Daily  . emtricitabine-tenofovir AF  1 tablet Oral Daily  . feeding supplement  1 Container Oral TID BM  . [START ON 09/21/2016] fluconazole  400 mg Oral Daily  . folic acid  1 mg Oral Daily  . heparin  5,000 Units Subcutaneous Q8H  . multivitamin with minerals  1 tablet  Oral Daily  . nicotine  14 mg Transdermal Daily  . thiamine  100 mg Oral Daily   Continuous Infusions:   Procedures/Studies: Ct Soft Tissue Neck W Contrast  Result Date: 09/15/2016 CLINICAL DATA:  Right floor of mouth infection and multiple oral ulcers. Follow-up after 2 days of IV antibiotics. EXAM: CT NECK WITH CONTRAST TECHNIQUE: Multidetector CT imaging of the neck was performed using the standard protocol following the bolus administration of intravenous contrast. CONTRAST:  75mL ISOVUE-300 IOPAMIDOL (ISOVUE-300) INJECTION 61% COMPARISON:  09/12/2016 FINDINGS: Pharynx and larynx: Fluid collection in the right floor of mouth has mildly increased in size, now measuring 2.2 x 1.6 x 2.0 cm (series 3, image 49). Soft tissue ulceration at the posterior margin of the hard palate is again noted, with less gas than on the prior study. Inflammatory changes in the soft palate, left lateral nasopharynx, and upper left lateral oropharynx have progressed from the prior study, and there is now the appearance of more organized though complex fluid collections in these regions. This extends over a maximal area of approximately 3.9 x 2.9 cm on axial images (series 3, image 28) with extension to the midline of the soft palate. The airway remains patent. Larynx is unremarkable. No retropharyngeal fluid collection. Salivary glands: Submandibular and parotid glands are unremarkable. Thyroid: Unremarkable. Lymph nodes: Numerous subcentimeter short axis cervical lymph nodes are again seen bilaterally, similar to the prior study and likely reactive. Vascular: Major vascular structures of the neck appear patent. Limited intracranial: Unremarkable. Visualized orbits: Unremarkable. Mastoids and visualized paranasal sinuses: Mild mucosal thickening and small volume fluid in the sphenoid sinuses, increased from prior. Trace bilateral maxillary sinus mucosal thickening. Clear mastoid air cells. Skeleton: No acute abnormality or  suspicious lytic or blastic osseous lesion identified. Upper chest: Similar appearance of scattered ground-glass and tree-in-bud nodular densities in both upper lobes. Other: None. IMPRESSION: 1. Evidence of worsening infection with mildly increased size of right floor of mouth abscess and development of new organized abscesses and phlegmon involving the soft palate and left upper pharynx. These results will be called to the ordering clinician or representative by the Radiologist Assistant, and communication documented in the PACS or zVision Dashboard. Electronically Signed  By: Sebastian Ache M.D.   On: 09/15/2016 18:04   Ct Soft Tissue Neck W Contrast  Result Date: 09/12/2016 CLINICAL DATA:  Initial evaluation for ulcerative lesions on hard palate to bone. Throat pain. History of HIV. EXAM: CT NECK WITH CONTRAST TECHNIQUE: Multidetector CT imaging of the neck was performed using the standard protocol following the bolus administration of intravenous contrast. CONTRAST:  75 cc of Isovue-300. COMPARISON:  None available. FINDINGS: Pharynx and larynx: Oral tongue within normal limits without acute abnormality. Circumscribed ovoid hypodense lesion measuring 1.9 x 1.5 x 1.5 cm present at the right floor of mouth (series 2, image 63). No significant rim enhancement or associated inflammatory changes. Overlying lingual aspect of the alveolar ridge is intact without dehiscence or evidence of significant dental disease within this region. Left floor of mouth without abnormality. Probable ulcerative lesions noted at the junction of the hard and soft palate as described in provided history (series 2, image 40). These extend immediately posterior to the hard palate with extension into the left posterior nasal cavity (series 4, image 47). No associated abscess. Palatine tonsils symmetric and within normal limits. Parapharyngeal fat grossly preserved. Nasopharynx within normal limits. Remainder of the oropharynx within  normal limits without significant inflammatory changes. No retropharyngeal fluid collection. Epiglottis is normal. Vallecula is clear. Remainder of the hypopharynx and supraglottic larynx are normal. True cords symmetric and within normal limits. Subglottic airway is clear. Salivary glands: Parotid glands and submandibular glands are within normal limits. No stones identified. Thyroid: Thyroid gland normal. Lymph nodes: Shotty sub cm cervical lymph nodes present within the neck bilaterally. No pathologically enlarged lymph nodes identified. Vascular: Normal intravascular enhancement seen throughout the neck. Limited intracranial: Visualized portions of the brain are unremarkable. Visualized orbits: Globes and orbits within normal limits. Mastoids and visualized paranasal sinuses: Mastoid air cells are clear. Middle ear cavities are well pneumatized. Mucosal thickening present within the bilateral maxillary sinuses, left greater than right. Mucosal thickening with opacity within the sphenoid sinuses bilaterally, also worse on the left. Mild left-sided ethmoidal sinus disease. Frontal sinuses are clear. Skeleton: No acute osseous abnormality. No worrisome lytic or blastic osseous lesions. Upper chest: Scattered nodular tree-in-bud with ground-glass opacities within the partially visualized lungs, suspicious for possible endobronchial infection. IMPRESSION: 1. At least 2 adjacent ulcerative lesions involving the central and left soft palate, with extension into the left nasal cavity as above. Hard palate itself remains intact. 2. 1.9 x 1.5 x 1.5 cm hypodensity within the right floor of mouth, suspicious for possible phlegmon/abscess. Correlation with physical exam recommended. 3. Nodular and ground-glass opacities within the partially visualized lungs, suspicious for possible acute endobronchial infection/small airways disease. 4. Mild to moderate paranasal sinus disease as above, worse on the left. Electronically  Signed   By: Rise Mu M.D.   On: 09/12/2016 05:25   Ct Pelvis W Contrast  Result Date: 08/22/2016 CLINICAL DATA:  Rectal pain.  History of perirectal abscess. EXAM: CT PELVIS WITH CONTRAST TECHNIQUE: Multidetector CT imaging of the pelvis was performed using the standard protocol following the bolus administration of intravenous contrast. CONTRAST:  75mL ISOVUE-300 IOPAMIDOL (ISOVUE-300) INJECTION 61% COMPARISON:  08/27/2010 FINDINGS: No CT evidence of a perirectal abscess. No acute inflammatory changes. Visible portions of lower abdominal bowel and viscera are unremarkable. No ascites. No adenopathy. IMPRESSION: Negative for abscess or drainable collection. Electronically Signed   By: Ellery Plunk M.D.   On: 08/22/2016 06:23    Lister Brizzi, DO  [facility] Pager [phone]  If 7PM-7AM, please contact night-coverage [email or web address] Password [facility] 09/20/2016, 6:44 PM   LOS: 8 days

## 2016-09-20 NOTE — Progress Notes (Signed)
INFECTIOUS DISEASE PROGRESS NOTE  ID: Justin Ball is a 25 y.o. male with  Principal Problem:   Ulcer of soft palate Active Problems:   Bipolar disorder (HCC)   H/O noncompliance with medical treatment - finincial, presenting hazards to health   AIDS (HCC)   Acute hypokalemia   Anemia   Alcohol use (HCC)   Oral abscess  Subjective: without complaints  Abtx:  Anti-infectives    Start     Dose/Rate Route Frequency Ordered Stop   09/20/16 1200  fluconazole (DIFLUCAN) IVPB 400 mg     400 mg 100 mL/hr over 120 Minutes Intravenous Every 24 hours 09/19/16 1239     09/19/16 0800  darunavir-cobicistat (PREZCOBIX) 800-150 MG per tablet 1 tablet     1 tablet Oral Daily with breakfast 09/18/16 1327     09/19/16 0000  anidulafungin (ERAXIS) 100 mg in sodium chloride 0.9 % 100 mL IVPB     100 mg over 90 Minutes Intravenous  Once 09/18/16 1602 09/19/16 0216   09/18/16 1615  anidulafungin (ERAXIS) 200 mg in sodium chloride 0.9 % 200 mL IVPB     200 mg over 180 Minutes Intravenous  Once 09/18/16 1602 09/18/16 2003   09/17/16 1600  fluconazole (DIFLUCAN) 40 MG/ML suspension 100 mg  Status:  Discontinued     100 mg Oral Daily 09/17/16 1436 09/18/16 1602   09/16/16 0959  polymyxin B 500,000 Units, bacitracin 50,000 Units in sodium chloride irrigation 0.9 % 500 mL irrigation  Status:  Discontinued       As needed 09/16/16 1000 09/16/16 1028   09/13/16 1200  piperacillin-tazobactam (ZOSYN) IVPB 3.375 g  Status:  Discontinued     3.375 g 12.5 mL/hr over 240 Minutes Intravenous Every 8 hours 09/13/16 1053 09/13/16 1055   09/13/16 1200  piperacillin-tazobactam (ZOSYN) IVPB 3.375 g     3.375 g 12.5 mL/hr over 240 Minutes Intravenous Every 8 hours 09/13/16 1055     09/13/16 0800  Darunavir Ethanolate (PREZISTA) tablet 800 mg  Status:  Discontinued     800 mg Oral Daily with breakfast 09/12/16 0938 09/12/16 0939   09/13/16 0800  ritonavir ([name]) tablet 100 mg  Status:  Discontinued     100 mg  Oral Daily with breakfast 09/12/16 0938 09/12/16 0939   09/12/16 1400  clindamycin (CLEOCIN) IVPB 600 mg  Status:  Discontinued     600 mg 100 mL/hr over 30 Minutes Intravenous Every 8 hours 09/12/16 0748 09/12/16 0926   09/12/16 1030  emtricitabine-tenofovir AF (DESCOVY) 200-25 MG per tablet 1 tablet     1 tablet Oral Daily 09/12/16 0938     09/12/16 1030  dolutegravir (TIVICAY) tablet 50 mg     50 mg Oral Daily 09/12/16 0938     09/12/16 1030  darunavir-cobicistat (PREZCOBIX) 800-150 MG per tablet 1 tablet  Status:  Discontinued     1 tablet Oral Daily 09/12/16 0939 09/18/16 1327   09/12/16 0545  clindamycin (CLEOCIN) IVPB 600 mg     600 mg 100 mL/hr over 30 Minutes Intravenous  Once 09/12/16 0544 09/12/16 0820      Medications:  Scheduled: . darunavir-cobicistat  1 tablet Oral Q breakfast  . dolutegravir  50 mg Oral Daily  . emtricitabine-tenofovir AF  1 tablet Oral Daily  . feeding supplement  1 Container Oral TID BM  . fluconazole (DIFLUCAN) IV  400 mg Intravenous Q24H  . folic acid  1 mg Oral Daily  . heparin  5,000 Units Subcutaneous  Q8H  . multivitamin with minerals  1 tablet Oral Daily  . nicotine  14 mg Transdermal Daily  . piperacillin-tazobactam (ZOSYN)  IV  3.375 g Intravenous Q8H  . thiamine  100 mg Oral Daily    Objective: Vital signs in last 24 hours: Temp:  [98.2 F (36.8 C)-98.3 F (36.8 C)] 98.2 F (36.8 C) (09/27 0533) Pulse Rate:  [68-75] 73 (09/27 0533) Resp:  [15-18] 18 (09/27 0533) BP: (96-108)/(58-70) 108/70 (09/27 0533) SpO2:  [97 %-99 %] 97 % (09/27 0533)   General appearance: alert, cooperative and no distress Throat: oral ulcers slightly better. smaller Neck: no adenopathy, supple, symmetrical, trachea midline and nono-tender  Lab Results  Recent Labs  09/18/16 0546  WBC 3.5*  HGB 11.8*  HCT 35.3*   Liver Panel No results for input(s): PROT, ALBUMIN, AST, ALT, ALKPHOS, BILITOT, BILIDIR, IBILI in the last 72 hours. Sedimentation  Rate No results for input(s): ESRSEDRATE in the last 72 hours. C-Reactive Protein No results for input(s): CRP in the last 72 hours.  Microbiology: Recent Results (from the past 240 hour(s))  Rapid strep screen     Status: None   Collection Time: 09/11/16 11:25 PM  Result Value Ref Range Status   Streptococcus, Group A Screen (Direct) NEGATIVE NEGATIVE Final    Comment: (NOTE) A Rapid Antigen test may result negative if the antigen level in the sample is below the detection level of this test. The FDA has not cleared this test as a stand-alone test therefore the rapid antigen negative result has reflexed to a Group A Strep culture.   Culture, group A strep     Status: None   Collection Time: 09/11/16 11:25 PM  Result Value Ref Range Status   Specimen Description THROAT  Final   Special Requests NONE Reflexed from [id number]  Final   Culture NO GROUP A STREP (S.PYOGENES) ISOLATED  Final   Report Status 09/14/2016 FINAL  Final  Surgical pcr screen     Status: None   Collection Time: 09/15/16  9:36 PM  Result Value Ref Range Status   MRSA, PCR NEGATIVE NEGATIVE Final   Staphylococcus aureus NEGATIVE NEGATIVE Final    Comment:        The Xpert SA Assay (FDA approved for NASAL specimens in patients over 25 years of age), is one component of a comprehensive surveillance program.  Test performance has been validated by [facility] for patients greater than or equal to 25 year old. It is not intended to diagnose infection nor to guide or monitor treatment.   Aerobic/Anaerobic Culture (surgical/deep wound)     Status: None (Preliminary result)   Collection Time: 09/16/16 10:49 AM  Result Value Ref Range Status   Specimen Description ABSCESS ORAL  Final   Special Requests NONE  Final   Gram Stain   Final    RARE WBC PRESENT,BOTH PMN AND MONONUCLEAR RARE GRAM VARIABLE ROD RARE GRAM POSITIVE COCCI IN CHAINS    Culture   Final    RARE CANDIDA ALBICANS NO OTHER FLORA PRESENT NO  ANAEROBES ISOLATED; CULTURE IN PROGRESS FOR 5 DAYS    Report Status PENDING  Incomplete    Studies/Results: No results found.   Assessment/Plan: Submandibular abscess Palatal ulcers AIDS Rash (suspect immune flare vs sulfa rxn from darunavir)  Total days of antibiotics: 5zosyn, 1 diflucan Prezcobix, descovy, tivicay He is doing well and can be d/c home.  Would continue his anbx for at least 1 month.  His Cx and path  are not helpful. 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  I would be glad to see him in f/u.          Justin Ball Infectious Diseases (pager) [phone] [name] 09/20/2016, 1:34 PM  LOS: 8 days

## 2016-09-20 NOTE — Progress Notes (Addendum)
CM faxed prescriptions( Augmentin and Diflucan ) to [facility] for pt to pick up @ d/c on tomorrow. Regarding HIV meds, Nurses Olegario Messier( Kathy and DoniphanMichelle) from ID clinic will see pt in am (9am) to [facility]  and [facility]  to ensure  HIV  meds are received by pt. Gae GallopAngela Mochizuki RN,BSN,CM

## 2016-09-20 NOTE — Progress Notes (Signed)
Initial Nutrition Assessment  DOCUMENTATION CODES:   Not applicable  INTERVENTION:   -D/c Ensure Enlive po BID, each supplement provides 350 kcal and 20 grams of protein -Boost Breeze po TID, each supplement provides 250 kcal and 9 grams of protein -Continue MVI daily  NUTRITION DIAGNOSIS:   Increased nutrient needs related to chronic illness as evidenced by estimated needs.  GOAL:   Patient will meet greater than or equal to 90% of their needs  MONITOR:   PO intake, Supplement acceptance, Labs, Weight trends, Skin, I & O's  REASON FOR ASSESSMENT:   Malnutrition Screening Tool    ASSESSMENT:   Justin Ball is a 25 y.o. male with a Past Medical History of HIV, medical non-compliance, bipolar, polysubstance abuse who presents with oral lesions and likely abscess. ENT and HIV consults pending.   Pt admitted with rt submandibular abscess and multiple oral ulcers.   S/p PROCEDURE on 09/16/16:   1) Incision and drainage of right floor of mouth abscess 2) Biopsy of soft palate ulcers (x2)  Pt sleeping soundly and wrapped in multiple blankets at time of visit. Pt did not wake when name was called. Unable to perform nutrition-focused physical exam at this time; pt appears thin-framed and no depletion noted on temple or orbital region.   Reviewed wt hx; noted a 8.1% wt loss over the past year. While not significant for time frame, wt loss is concerning given pt's medical noncompliance and hx of HIV/AIDS. Unable to confirm malnutrition at this time, however, pt is at risk given medical hx and recent sx.   Meal completion 40-100%. Noted Ensure Shiela Mayernlive has been ordered BID, however, supplement on bedside table was unopened and pt refused supplements [facility] also noted finished containers of outside food ([employer] and [employer]). Suspect pt may be supplementing intake with outside food.   Medications reviewed and include nystatin, folvite, MVI, and  thiamine.   Labs reviewed.   Diet Order:  Diet regular Room service appropriate? Yes; Fluid consistency: Thin  Skin:  Reviewed, no issues  Last BM:  09/19/16  Height:   Ht Readings from Last 1 Encounters:  09/11/16 6\' 1"  (1.854 m)    Weight:   Wt Readings from Last 1 Encounters:  09/11/16 147 lb 12.8 oz (67 kg)    Ideal Body Weight:  83.6 kg  BMI:  Body mass index is 19.5 kg/m.  Estimated Nutritional Needs:   Kcal:  1800-2000  Protein:  90-105 grams  Fluid:  1.8-2.0 L  EDUCATION NEEDS:   Education needs addressed  Justin Ball, RD, LDN, CDE Pager: [phone] After hours Pager: [phone]

## 2016-09-20 NOTE — Progress Notes (Signed)
Subjective: Mouth and throat pain has improved.  Objective: Vital signs in last 24 hours: Temp:  [98.2 F (36.8 C)-98.3 F (36.8 C)] 98.2 F (36.8 C) (09/27 0533) Pulse Rate:  [68-75] 73 (09/27 0533) Resp:  [15-18] 18 (09/27 0533) BP: (96-108)/(58-70) 108/70 (09/27 0533) SpO2:  [97 %-99 %] 97 % (09/27 0533)  Physical Exam Constitutional: He appears well-developedand well-nourished. No distress.  Head: Normocephalicand atraumatic.  Mouth/Throat: Uvula is midlineand oropharynx is clear and moist. No trismus. Patient managing oral secretions. Two ulcerations noted on palate, s/p biopsy. Healing well. The right floor of mouth is mildlytender to palpation. Eyes: Conjunctivaeand EOMare normal. Pupils are equal, round, and reactive to light. No scleral icterus.  Neck: Normal range of motionand phonation normal. Neck supple. No neck rigidity. Normal range of motionpresent.  No stridor.  Neurological: He is alert and oriented x3. Skin: Skin is warmand dry. He is not diaphoretic.  Psychiatric: He has a normal mood and affect. His behavior is normal.   Recent Labs  09/18/16 0546  WBC 3.5*  HGB 11.8*  HCT 35.3*  PLT 416*   No results for input(s): NA, K, CL, CO2, GLUCOSE, BUN, CREATININE, CALCIUM in the last 72 hours.  Medications:  I have reviewed the patient's current medications. Scheduled: . darunavir-cobicistat  1 tablet Oral Q breakfast  . dolutegravir  50 mg Oral Daily  . emtricitabine-tenofovir AF  1 tablet Oral Daily  . feeding supplement (ENSURE ENLIVE)  237 mL Oral BID BM  . fluconazole (DIFLUCAN) IV  400 mg Intravenous Q24H  . folic acid  1 mg Oral Daily  . heparin  5,000 Units Subcutaneous Q8H  . multivitamin with minerals  1 tablet Oral Daily  . nicotine  14 mg Transdermal Daily  . piperacillin-tazobactam (ZOSYN)  IV  3.375 g Intravenous Q8H  . thiamine  100 mg Oral Daily   JXB:[id number]:acetaminophen **OR** [DISCONTINUED] acetaminophen, diphenhydrAMINE,  HYDROmorphone (DILAUDID) injection, ondansetron (ZOFRAN) IV, oxyCODONE-acetaminophen  Assessment/Plan: Pt s/p I&D of submandibular abscess and palatal biopsy. Clinically improving. Antibiotic/antifungal as per ID service.   LOS: 8 days   Justin Ball,SUI W 09/20/2016, 7:46 AM

## 2016-09-21 MED ORDER — SULFAMETHOXAZOLE-TRIMETHOPRIM 800-160 MG PO TABS: 1.0000 | ORAL_TABLET | ORAL | 1 refills | Status: DC

## 2016-09-21 MED ORDER — DARUNAVIR-COBICISTAT 800-150 MG PO TABS: 1.0000 | ORAL_TABLET | Freq: Every day | ORAL | 1 refills | Status: DC

## 2016-09-21 MED ORDER — EMTRICITABINE-TENOFOVIR AF 200-25 MG PO TABS: 1.0000 | ORAL_TABLET | Freq: Every day | ORAL | 1 refills | Status: DC

## 2016-09-21 MED ORDER — SULFAMETHOXAZOLE-TRIMETHOPRIM 800-160 MG PO TABS: 1.0000 | ORAL_TABLET | ORAL | Status: DC

## 2016-09-21 MED ORDER — BOOST / RESOURCE BREEZE PO LIQD: 1.0000 | Freq: Three times a day (TID) | ORAL | 0 refills | Status: DC

## 2016-09-21 NOTE — Progress Notes (Signed)
INFECTIOUS DISEASE PROGRESS NOTE  ID: Justin Ball is a 25 y.o. male with  Principal Problem:   Ulcer of soft palate Active Problems:   Bipolar disorder (HCC)   H/O noncompliance with medical treatment - finincial, presenting hazards to health   AIDS (HCC)   Acute hypokalemia   Anemia   Alcohol use (HCC)   Oral abscess  Subjective: occas n/v.   Abtx:  Anti-infectives    Start     Dose/Rate Route Frequency Ordered Stop   09/22/16 0900  sulfamethoxazole-trimethoprim (BACTRIM DS,SEPTRA DS) 800-160 MG per tablet 1 tablet     1 tablet Oral Once per day on Mon Wed Fri 09/21/16 1046     09/21/16 1000  fluconazole (DIFLUCAN) tablet 400 mg     400 mg Oral Daily 09/20/16 1506     09/21/16 0000  fluconazole (DIFLUCAN) 200 MG tablet     400 mg Oral Daily 09/20/16 1508     09/20/16 2000  amoxicillin-clavulanate (AUGMENTIN) 875-125 MG per tablet 1 tablet     1 tablet Oral Every 12 hours 09/20/16 1506     09/20/16 1200  fluconazole (DIFLUCAN) IVPB 400 mg  Status:  Discontinued     400 mg 100 mL/hr over 120 Minutes Intravenous Every 24 hours 09/19/16 1239 09/20/16 1506   09/20/16 0000  amoxicillin-clavulanate (AUGMENTIN) 875-125 MG tablet     1 tablet Oral Every 12 hours 09/20/16 1508     09/19/16 0800  darunavir-cobicistat (PREZCOBIX) 800-150 MG per tablet 1 tablet     1 tablet Oral Daily with breakfast 09/18/16 1327     09/19/16 0000  anidulafungin (ERAXIS) 100 mg in sodium chloride 0.9 % 100 mL IVPB     100 mg over 90 Minutes Intravenous  Once 09/18/16 1602 09/19/16 0216   09/18/16 1615  anidulafungin (ERAXIS) 200 mg in sodium chloride 0.9 % 200 mL IVPB     200 mg over 180 Minutes Intravenous  Once 09/18/16 1602 09/18/16 2003   09/17/16 1600  fluconazole (DIFLUCAN) 40 MG/ML suspension 100 mg  Status:  Discontinued     100 mg Oral Daily 09/17/16 1436 09/18/16 1602   09/16/16 0959  polymyxin B 500,000 Units, bacitracin 50,000 Units in sodium chloride irrigation 0.9 % 500 mL  irrigation  Status:  Discontinued       As needed 09/16/16 1000 09/16/16 1028   09/13/16 1200  piperacillin-tazobactam (ZOSYN) IVPB 3.375 g  Status:  Discontinued     3.375 g 12.5 mL/hr over 240 Minutes Intravenous Every 8 hours 09/13/16 1053 09/13/16 1055   09/13/16 1200  piperacillin-tazobactam (ZOSYN) IVPB 3.375 g  Status:  Discontinued     3.375 g 12.5 mL/hr over 240 Minutes Intravenous Every 8 hours 09/13/16 1055 09/20/16 1506   09/13/16 0800  Darunavir Ethanolate (PREZISTA) tablet 800 mg  Status:  Discontinued     800 mg Oral Daily with breakfast 09/12/16 0938 09/12/16 0939   09/13/16 0800  ritonavir ([name]) tablet 100 mg  Status:  Discontinued     100 mg Oral Daily with breakfast 09/12/16 0938 09/12/16 0939   09/12/16 1400  clindamycin (CLEOCIN) IVPB 600 mg  Status:  Discontinued     600 mg 100 mL/hr over 30 Minutes Intravenous Every 8 hours 09/12/16 0748 09/12/16 0926   09/12/16 1030  emtricitabine-tenofovir AF (DESCOVY) 200-25 MG per tablet 1 tablet     1 tablet Oral Daily 09/12/16 0938     09/12/16 1030  dolutegravir (TIVICAY) tablet 50 mg  50 mg Oral Daily 09/12/16 0938     09/12/16 1030  darunavir-cobicistat (PREZCOBIX) 800-150 MG per tablet 1 tablet  Status:  Discontinued     1 tablet Oral Daily 09/12/16 0939 09/18/16 1327   09/12/16 0545  clindamycin (CLEOCIN) IVPB 600 mg     600 mg 100 mL/hr over 30 Minutes Intravenous  Once 09/12/16 0544 09/12/16 0820      Medications:  Scheduled: . amoxicillin-clavulanate  1 tablet Oral Q12H  . darunavir-cobicistat  1 tablet Oral Q breakfast  . dolutegravir  50 mg Oral Daily  . emtricitabine-tenofovir AF  1 tablet Oral Daily  . feeding supplement  1 Container Oral TID BM  . fluconazole  400 mg Oral Daily  . folic acid  1 mg Oral Daily  . heparin  5,000 Units Subcutaneous Q8H  . multivitamin with minerals  1 tablet Oral Daily  . nicotine  14 mg Transdermal Daily  . [START ON 09/22/2016] sulfamethoxazole-trimethoprim  1 tablet  Oral Once per day on Mon Wed Fri  . thiamine  100 mg Oral Daily    Objective: Vital signs in last 24 hours: Temp:  [98.1 F (36.7 C)-98.3 F (36.8 C)] 98.1 F (36.7 C) (09/28 0526) Pulse Rate:  [66-77] 70 (09/28 0526) Resp:  [13-19] 19 (09/27 2115) BP: (97-108)/(64-70) 108/67 (09/28 0526) SpO2:  [98 %-99 %] 99 % (09/28 0526)   General appearance: alert, cooperative and no distress Throat: ulcers slightly better, decreased in size.  Resp: clear to auscultation bilaterally Cardio: regular rate and rhythm GI: normal findings: bowel sounds normal and soft, non-tender  Lab Results No results for input(s): WBC, HGB, HCT, NA, K, CL, CO2, BUN, CREATININE, GLU in the last 72 hours.  Invalid input(s): PLATELETS Liver Panel No results for input(s): PROT, ALBUMIN, AST, ALT, ALKPHOS, BILITOT, BILIDIR, IBILI in the last 72 hours. Sedimentation Rate No results for input(s): ESRSEDRATE in the last 72 hours. C-Reactive Protein No results for input(s): CRP in the last 72 hours.  Microbiology: Recent Results (from the past 240 hour(s))  Rapid strep screen     Status: None   Collection Time: 09/11/16 11:25 PM  Result Value Ref Range Status   Streptococcus, Group A Screen (Direct) NEGATIVE NEGATIVE Final    Comment: (NOTE) A Rapid Antigen test may result negative if the antigen level in the sample is below the detection level of this test. The FDA has not cleared this test as a stand-alone test therefore the rapid antigen negative result has reflexed to a Group A Strep culture.   Culture, group A strep     Status: None   Collection Time: 09/11/16 11:25 PM  Result Value Ref Range Status   Specimen Description THROAT  Final   Special Requests NONE Reflexed from [id number]  Final   Culture NO GROUP A STREP (S.PYOGENES) ISOLATED  Final   Report Status 09/14/2016 FINAL  Final  Surgical pcr screen     Status: None   Collection Time: 09/15/16  9:36 PM  Result Value Ref Range Status   MRSA, PCR  NEGATIVE NEGATIVE Final   Staphylococcus aureus NEGATIVE NEGATIVE Final    Comment:        The Xpert SA Assay (FDA approved for NASAL specimens in patients over 65 years of age), is one component of a comprehensive surveillance program.  Test performance has been validated by [facility] for patients greater than or equal to 96 year old. It is not intended to diagnose infection nor to guide  or monitor treatment.   Aerobic/Anaerobic Culture (surgical/deep wound)     Status: None (Preliminary result)   Collection Time: 09/16/16 10:49 AM  Result Value Ref Range Status   Specimen Description ABSCESS ORAL  Final   Special Requests NONE  Final   Gram Stain   Final    RARE WBC PRESENT,BOTH PMN AND MONONUCLEAR RARE GRAM VARIABLE ROD RARE GRAM POSITIVE COCCI IN CHAINS    Culture   Final    RARE CANDIDA ALBICANS NO OTHER FLORA PRESENT NO ANAEROBES ISOLATED; CULTURE IN PROGRESS FOR 5 DAYS    Report Status PENDING  Incomplete    Studies/Results: No results found.   Assessment/Plan: Submandibular abscess Palatal ulcers AIDS Rash (suspect immune flare vs sulfa rxn from darunavir)  Total days of antibiotics: 6zosyn, 2 diflucan Prezcobix, descovy, tivicay He is doing well and can be d/c home.  Consider anti-nausea rx Would continue his anbx for at least 1 month.  Will be a gap in his ART prior to resuming [facility].  Start PCP prophylaxis, check CD4 and HIV VL and genotype prior to d/c.  His Cx and path are not helpful. 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  I would be glad to see him in f/u.     Johny Sax Infectious Diseases (pager) [phone] [email or web address] 09/21/2016, 10:47 AM  LOS: 9 days

## 2016-09-21 NOTE — Discharge Summary (Signed)
Physician Discharge Summary  Justin Ball AVW:[id number] DOB: March 04, 1991 DOA: 09/12/2016  PCP: No primary care provider on file.  Admit date: 09/12/2016 Discharge date: 09/21/2016  Admitted From: Home Disposition:  Home  Recommendations for Outpatient Follow-up:  1. Follow up with PCP in 1-2 weeks 2. Please obtain BMP/CBC in one week   Home Health:No Equipment/Devices:N/A  Discharge Condition:Stable CODE STATUS:FULL Diet recommendation: regular  Brief/Interim Summary: 25 year old male with history of HIV/AIDS, noncompliant with medications, bipolar disorder, polysubstance abuse, presented to [facility] ED on 09/12/16 with 2 weeks history of sore throat, sinus pressure, eye drainage, painful oral lesions and concern for oral abscess. ENT and ID consulted.Initially treated with broad-spectrum IV antibiotics. Clinically and radiologically right submandibular abscess got worse. He underwent incision and drainage of abscess and biopsy of palatal ulcer by ENT. Slowly improving, but able to eat without difficulty  Discharge Diagnoses:  Right Submandibular abscess and multiple oral ulcers - ENT and ID were consulted. Patient was initially treated with IV Zosyn for approximately 48 hours. CT was repeated which showed enlarging abscess. He was taken to the OR on 9/23 and underwent I&D of right submandibular abscess and a large amount of purulent fluid was drained and the palatal ulcers were biopsied-as per ID, culture shows Candida albicans and changing to Diflucan -ID discussed with pathology >no cancer. 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 Discussed with ENT 9/25 who indicated that he likely had it from before but was made because after biopsy of palatal ulcer. No change in management at this time. Await results of ulcer biopsy. If needed, in the future this can be closed by sutures.  -  Improving. Home with fluconazole 400 mg daily and augmentin x 27 more days -pt has filled out papers for ADAP prior to d/c, but will likely have a lapse in his HIV therapy for up to a week  HIV/AIDS - Continue ART per ID. - CD4: 20. - Patient complaining of pruritus +/- rash: As per ID follow-up, hopefully not sulfa reaction to darunavir. More likely subtle IRIS  Noncompliance - Discussed extensively with patient. He indicates that he does not get time off from his work to keep up physician appointments.  Hypokalemia - Replaced.  Bipolar disorder - not on medications pta.  Alcohol use - No withdrawal  Anemia - May be dilutional. Low serum folate >continue at discharge. Stable.   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 Nicotine patch started upon request.    Discharge Instructions  Discharge Instructions    Diet general    Complete by:  As directed    Increase activity slowly    Complete by:  As directed        Medication List    STOP taking these medications   clindamycin 150 MG capsule Commonly known as:  CLEOCIN   emtricitabine-tenofovir 200-300 MG tablet Commonly known as:  TRUVADA     TAKE these medications   amoxicillin-clavulanate 875-125 MG tablet Commonly known as:  AUGMENTIN Take 1 tablet by mouth every 12 (twelve) hours.   darunavir-cobicistat 800-150 MG tablet Commonly known as:  PREZCOBIX Take 1 tablet by mouth daily with breakfast. Swallow whole. Do NOT crush, break or chew tablets. Take with food. Start taking on:  09/22/2016   dolutegravir 50 MG tablet Commonly known as:  TIVICAY Take 1 tablet (50 mg total) by  mouth daily.   emtricitabine-tenofovir AF 200-25 MG tablet Commonly known as:  DESCOVY Take 1 tablet by mouth daily. Start taking on:  09/22/2016   feeding supplement Liqd Take 1 Container by mouth 3 (three) times daily between meals.     fluconazole 200 MG tablet Commonly known as:  DIFLUCAN Take 2 tablets (400 mg total) by mouth daily.   sulfamethoxazole-trimethoprim 800-160 MG tablet Commonly known as:  BACTRIM DS,SEPTRA DS Take 1 tablet by mouth 3 (three) times a week. Start taking on:  09/22/2016       No Known Allergies  Consultations:  ENT--Dr. Suszanne Conners  ID--[facility]   Procedures/Studies: Ct Soft Tissue Neck W Contrast  Result Date: 09/15/2016 CLINICAL DATA:  Right floor of mouth infection and multiple oral ulcers. Follow-up after 2 days of IV antibiotics. EXAM: CT NECK WITH CONTRAST TECHNIQUE: Multidetector CT imaging of the neck was performed using the standard protocol following the bolus administration of intravenous contrast. CONTRAST:  75mL ISOVUE-300 IOPAMIDOL (ISOVUE-300) INJECTION 61% COMPARISON:  09/12/2016 FINDINGS: Pharynx and larynx: Fluid collection in the right floor of mouth has mildly increased in size, now measuring 2.2 x 1.6 x 2.0 cm (series 3, image 49). Soft tissue ulceration at the posterior margin of the hard palate is again noted, with less gas than on the prior study. Inflammatory changes in the soft palate, left lateral nasopharynx, and upper left lateral oropharynx have progressed from the prior study, and there is now the appearance of more organized though complex fluid collections in these regions. This extends over a maximal area of approximately 3.9 x 2.9 cm on axial images (series 3, image 28) with extension to the midline of the soft palate. The airway remains patent. Larynx is unremarkable. No retropharyngeal fluid collection. Salivary glands: Submandibular and parotid glands are unremarkable. Thyroid: Unremarkable. Lymph nodes: Numerous subcentimeter short axis cervical lymph nodes are again seen bilaterally, similar to the prior study and likely reactive. Vascular: Major vascular structures of the neck appear patent. Limited intracranial: Unremarkable. Visualized orbits: Unremarkable.  Mastoids and visualized paranasal sinuses: Mild mucosal thickening and small volume fluid in the sphenoid sinuses, increased from prior. Trace bilateral maxillary sinus mucosal thickening. Clear mastoid air cells. Skeleton: No acute abnormality or suspicious lytic or blastic osseous lesion identified. Upper chest: Similar appearance of scattered ground-glass and tree-in-bud nodular densities in both upper lobes. Other: None. IMPRESSION: 1. Evidence of worsening infection with mildly increased size of right floor of mouth abscess and development of new organized abscesses and phlegmon involving the soft palate and left upper pharynx. These results will be called to the ordering clinician or representative by the Radiologist Assistant, and communication documented in the PACS or zVision Dashboard. Electronically Signed   By: Sebastian Ache M.D.   On: 09/15/2016 18:04   Ct Soft Tissue Neck W Contrast  Result Date: 09/12/2016 CLINICAL DATA:  Initial evaluation for ulcerative lesions on hard palate to bone. Throat pain. History of HIV. EXAM: CT NECK WITH CONTRAST TECHNIQUE: Multidetector CT imaging of the neck was performed using the standard protocol following the bolus administration of intravenous contrast. CONTRAST:  75 cc of Isovue-300. COMPARISON:  None available. FINDINGS: Pharynx and larynx: Oral tongue within normal limits without acute abnormality. Circumscribed ovoid hypodense lesion measuring 1.9 x 1.5 x 1.5 cm present at the right floor of mouth (series 2, image 63). No significant rim enhancement or associated inflammatory changes. Overlying lingual aspect of the alveolar ridge is intact without dehiscence or evidence of significant dental disease  within this region. Left floor of mouth without abnormality. Probable ulcerative lesions noted at the junction of the hard and soft palate as described in provided history (series 2, image 40). These extend immediately posterior to the hard palate with extension  into the left posterior nasal cavity (series 4, image 47). No associated abscess. Palatine tonsils symmetric and within normal limits. Parapharyngeal fat grossly preserved. Nasopharynx within normal limits. Remainder of the oropharynx within normal limits without significant inflammatory changes. No retropharyngeal fluid collection. Epiglottis is normal. Vallecula is clear. Remainder of the hypopharynx and supraglottic larynx are normal. True cords symmetric and within normal limits. Subglottic airway is clear. Salivary glands: Parotid glands and submandibular glands are within normal limits. No stones identified. Thyroid: Thyroid gland normal. Lymph nodes: Shotty sub cm cervical lymph nodes present within the neck bilaterally. No pathologically enlarged lymph nodes identified. Vascular: Normal intravascular enhancement seen throughout the neck. Limited intracranial: Visualized portions of the brain are unremarkable. Visualized orbits: Globes and orbits within normal limits. Mastoids and visualized paranasal sinuses: Mastoid air cells are clear. Middle ear cavities are well pneumatized. Mucosal thickening present within the bilateral maxillary sinuses, left greater than right. Mucosal thickening with opacity within the sphenoid sinuses bilaterally, also worse on the left. Mild left-sided ethmoidal sinus disease. Frontal sinuses are clear. Skeleton: No acute osseous abnormality. No worrisome lytic or blastic osseous lesions. Upper chest: Scattered nodular tree-in-bud with ground-glass opacities within the partially visualized lungs, suspicious for possible endobronchial infection. IMPRESSION: 1. At least 2 adjacent ulcerative lesions involving the central and left soft palate, with extension into the left nasal cavity as above. Hard palate itself remains intact. 2. 1.9 x 1.5 x 1.5 cm hypodensity within the right floor of mouth, suspicious for possible phlegmon/abscess. Correlation with physical exam recommended. 3.  Nodular and ground-glass opacities within the partially visualized lungs, suspicious for possible acute endobronchial infection/small airways disease. 4. Mild to moderate paranasal sinus disease as above, worse on the left. Electronically Signed   By: Rise Mu M.D.   On: 09/12/2016 05:25         Discharge Exam: Vitals:   09/21/16 0526 09/21/16 1247  BP: 108/67 113/76  Pulse: 70 86  Resp:  16  Temp: 98.1 F (36.7 C) 98.6 F (37 C)   Vitals:   09/20/16 1435 09/20/16 2115 09/21/16 0526 09/21/16 1247  BP: 108/70 97/64 108/67 113/76  Pulse: 66 77 70 86  Resp: 13 19  16   Temp: 98.3 F (36.8 C) 98.3 F (36.8 C) 98.1 F (36.7 C) 98.6 F (37 C)  TempSrc: Oral  Oral Oral  SpO2: 98% 99% 99% 99%  Weight:      Height:        General: Pt is alert, awake, not in acute distress Cardiovascular: RRR, S1/S2 +, no rubs, no gallops Respiratory: CTA bilaterally, no wheezing, no rhonchi Abdominal: Soft, NT, ND, bowel sounds + Extremities: no edema, no cyanosis   The results of significant diagnostics from this hospitalization (including imaging, microbiology, ancillary and laboratory) are listed below for reference.    Significant Diagnostic Studies: Ct Soft Tissue Neck W Contrast  Result Date: 09/15/2016 CLINICAL DATA:  Right floor of mouth infection and multiple oral ulcers. Follow-up after 2 days of IV antibiotics. EXAM: CT NECK WITH CONTRAST TECHNIQUE: Multidetector CT imaging of the neck was performed using the standard protocol following the bolus administration of intravenous contrast. CONTRAST:  75mL ISOVUE-300 IOPAMIDOL (ISOVUE-300) INJECTION 61% COMPARISON:  09/12/2016 FINDINGS: Pharynx and larynx: Fluid  collection in the right floor of mouth has mildly increased in size, now measuring 2.2 x 1.6 x 2.0 cm (series 3, image 49). Soft tissue ulceration at the posterior margin of the hard palate is again noted, with less gas than on the prior study. Inflammatory changes in the  soft palate, left lateral nasopharynx, and upper left lateral oropharynx have progressed from the prior study, and there is now the appearance of more organized though complex fluid collections in these regions. This extends over a maximal area of approximately 3.9 x 2.9 cm on axial images (series 3, image 28) with extension to the midline of the soft palate. The airway remains patent. Larynx is unremarkable. No retropharyngeal fluid collection. Salivary glands: Submandibular and parotid glands are unremarkable. Thyroid: Unremarkable. Lymph nodes: Numerous subcentimeter short axis cervical lymph nodes are again seen bilaterally, similar to the prior study and likely reactive. Vascular: Major vascular structures of the neck appear patent. Limited intracranial: Unremarkable. Visualized orbits: Unremarkable. Mastoids and visualized paranasal sinuses: Mild mucosal thickening and small volume fluid in the sphenoid sinuses, increased from prior. Trace bilateral maxillary sinus mucosal thickening. Clear mastoid air cells. Skeleton: No acute abnormality or suspicious lytic or blastic osseous lesion identified. Upper chest: Similar appearance of scattered ground-glass and tree-in-bud nodular densities in both upper lobes. Other: None. IMPRESSION: 1. Evidence of worsening infection with mildly increased size of right floor of mouth abscess and development of new organized abscesses and phlegmon involving the soft palate and left upper pharynx. These results will be called to the ordering clinician or representative by the Radiologist Assistant, and communication documented in the PACS or zVision Dashboard. Electronically Signed   By: Sebastian Ache M.D.   On: 09/15/2016 18:04   Ct Soft Tissue Neck W Contrast  Result Date: 09/12/2016 CLINICAL DATA:  Initial evaluation for ulcerative lesions on hard palate to bone. Throat pain. History of HIV. EXAM: CT NECK WITH CONTRAST TECHNIQUE: Multidetector CT imaging of the neck was  performed using the standard protocol following the bolus administration of intravenous contrast. CONTRAST:  75 cc of Isovue-300. COMPARISON:  None available. FINDINGS: Pharynx and larynx: Oral tongue within normal limits without acute abnormality. Circumscribed ovoid hypodense lesion measuring 1.9 x 1.5 x 1.5 cm present at the right floor of mouth (series 2, image 63). No significant rim enhancement or associated inflammatory changes. Overlying lingual aspect of the alveolar ridge is intact without dehiscence or evidence of significant dental disease within this region. Left floor of mouth without abnormality. Probable ulcerative lesions noted at the junction of the hard and soft palate as described in provided history (series 2, image 40). These extend immediately posterior to the hard palate with extension into the left posterior nasal cavity (series 4, image 47). No associated abscess. Palatine tonsils symmetric and within normal limits. Parapharyngeal fat grossly preserved. Nasopharynx within normal limits. Remainder of the oropharynx within normal limits without significant inflammatory changes. No retropharyngeal fluid collection. Epiglottis is normal. Vallecula is clear. Remainder of the hypopharynx and supraglottic larynx are normal. True cords symmetric and within normal limits. Subglottic airway is clear. Salivary glands: Parotid glands and submandibular glands are within normal limits. No stones identified. Thyroid: Thyroid gland normal. Lymph nodes: Shotty sub cm cervical lymph nodes present within the neck bilaterally. No pathologically enlarged lymph nodes identified. Vascular: Normal intravascular enhancement seen throughout the neck. Limited intracranial: Visualized portions of the brain are unremarkable. Visualized orbits: Globes and orbits within normal limits. Mastoids and visualized paranasal sinuses: Mastoid air  cells are clear. Middle ear cavities are well pneumatized. Mucosal thickening  present within the bilateral maxillary sinuses, left greater than right. Mucosal thickening with opacity within the sphenoid sinuses bilaterally, also worse on the left. Mild left-sided ethmoidal sinus disease. Frontal sinuses are clear. Skeleton: No acute osseous abnormality. No worrisome lytic or blastic osseous lesions. Upper chest: Scattered nodular tree-in-bud with ground-glass opacities within the partially visualized lungs, suspicious for possible endobronchial infection. IMPRESSION: 1. At least 2 adjacent ulcerative lesions involving the central and left soft palate, with extension into the left nasal cavity as above. Hard palate itself remains intact. 2. 1.9 x 1.5 x 1.5 cm hypodensity within the right floor of mouth, suspicious for possible phlegmon/abscess. Correlation with physical exam recommended. 3. Nodular and ground-glass opacities within the partially visualized lungs, suspicious for possible acute endobronchial infection/small airways disease. 4. Mild to moderate paranasal sinus disease as above, worse on the left. Electronically Signed   By: Rise MuBenjamin  McClintock M.D.   On: 09/12/2016 05:25     Microbiology: Recent Results (from the past 240 hour(s))  Rapid strep screen     Status: None   Collection Time: 09/11/16 11:25 PM  Result Value Ref Range Status   Streptococcus, Group A Screen (Direct) NEGATIVE NEGATIVE Final    Comment: (NOTE) A Rapid Antigen test may result negative if the antigen level in the sample is below the detection level of this test. The FDA has not cleared this test as a stand-alone test therefore the rapid antigen negative result has reflexed to a Group A Strep culture.   Culture, group A strep     Status: None   Collection Time: 09/11/16 11:25 PM  Result Value Ref Range Status   Specimen Description THROAT  Final   Special Requests NONE Reflexed from [id number]  Final   Culture NO GROUP A STREP (S.PYOGENES) ISOLATED  Final   Report Status 09/14/2016 FINAL   Final  Surgical pcr screen     Status: None   Collection Time: 09/15/16  9:36 PM  Result Value Ref Range Status   MRSA, PCR NEGATIVE NEGATIVE Final   Staphylococcus aureus NEGATIVE NEGATIVE Final    Comment:        The Xpert SA Assay (FDA approved for NASAL specimens in patients over 25 years of age), is one component of a comprehensive surveillance program.  Test performance has been validated by [facility] for patients greater than or equal to 25 year old. It is not intended to diagnose infection nor to guide or monitor treatment.   Aerobic/Anaerobic Culture (surgical/deep wound)     Status: None (Preliminary result)   Collection Time: 09/16/16 10:49 AM  Result Value Ref Range Status   Specimen Description ABSCESS ORAL  Final   Special Requests NONE  Final   Gram Stain   Final    RARE WBC PRESENT,BOTH PMN AND MONONUCLEAR RARE GRAM VARIABLE ROD RARE GRAM POSITIVE COCCI IN CHAINS    Culture   Final    RARE CANDIDA ALBICANS NO OTHER FLORA PRESENT NO ANAEROBES ISOLATED; CULTURE IN PROGRESS FOR 5 DAYS    Report Status PENDING  Incomplete     Labs: Basic Metabolic Panel:  Recent Labs Lab 09/16/16 0556 09/17/16 0511  NA 136 138  K 3.4* 3.9  CL 101 103  CO2 29 29  GLUCOSE 81 85  BUN 7 6  CREATININE 1.16 1.03  CALCIUM 8.7* 9.1   Liver Function Tests: No results for input(s): AST, ALT, ALKPHOS, BILITOT, PROT,  ALBUMIN in the last 168 hours. No results for input(s): LIPASE, AMYLASE in the last 168 hours. No results for input(s): AMMONIA in the last 168 hours. CBC:  Recent Labs Lab 09/15/16 0618 09/16/16 0556 09/17/16 0511 09/18/16 0546  WBC 3.5* 3.0* 4.4 3.5*  HGB 11.4* 11.4* 10.8* 11.8*  HCT 34.0* 34.2* 33.3* 35.3*  MCV 91.9 93.7 94.3 93.6  PLT 345 381 427* 416*   Cardiac Enzymes: No results for input(s): CKTOTAL, CKMB, CKMBINDEX, TROPONINI in the last 168 hours. BNP: Invalid input(s): POCBNP CBG: No results for input(s): GLUCAP in the last 168  hours.  Time coordinating discharge:  Greater than 30 minutes  Signed:  Eleftheria Taborn, DO [facility] Pager: [phone] 09/21/2016, 6:18 PM

## 2016-09-21 NOTE — Progress Notes (Signed)
Nsg Discharge Note  Admit Date:  09/12/2016 Discharge date: 09/21/2016   Ola SpurrLinston R Pasquariello to be D/C'd Home per MD order.  AVS completed.  Copy for chart, and copy for patient signed, and dated. Patient/caregiver able to verbalize understanding.  Discharge Medication:   Medication List    STOP taking these medications   clindamycin 150 MG capsule Commonly known as:  CLEOCIN   emtricitabine-tenofovir 200-300 MG tablet Commonly known as:  TRUVADA     TAKE these medications   amoxicillin-clavulanate 875-125 MG tablet Commonly known as:  AUGMENTIN Take 1 tablet by mouth every 12 (twelve) hours.   darunavir-cobicistat 800-150 MG tablet Commonly known as:  PREZCOBIX Take 1 tablet by mouth daily with breakfast. Swallow whole. Do NOT crush, break or chew tablets. Take with food. Start taking on:  09/22/2016   dolutegravir 50 MG tablet Commonly known as:  TIVICAY Take 1 tablet (50 mg total) by mouth daily.   emtricitabine-tenofovir AF 200-25 MG tablet Commonly known as:  DESCOVY Take 1 tablet by mouth daily. Start taking on:  09/22/2016   feeding supplement Liqd Take 1 Container by mouth 3 (three) times daily between meals.   fluconazole 200 MG tablet Commonly known as:  DIFLUCAN Take 2 tablets (400 mg total) by mouth daily.   sulfamethoxazole-trimethoprim 800-160 MG tablet Commonly known as:  BACTRIM DS,SEPTRA DS Take 1 tablet by mouth 3 (three) times a week. Start taking on:  09/22/2016       Discharge Assessment: Vitals:   09/21/16 0526 09/21/16 1247  BP: 108/67 113/76  Pulse: 70 86  Resp:  16  Temp: 98.1 F (36.7 C) 98.6 F (37 C)   Skin clean, dry and intact without evidence of skin break down, no evidence of skin tears noted. IV catheter discontinued intact. Site without signs and symptoms of complications - no redness or edema noted at insertion site, patient denies c/o pain - only slight tenderness at site.  Dressing with slight pressure applied.  D/c  Instructions-Education: Discharge instructions given to patient/family with verbalized understanding. D/c education completed with patient/family including follow up instructions, medication list, d/c activities limitations if indicated, with other d/c instructions as indicated by MD - patient able to verbalize understanding, all questions fully answered. Patient instructed to return to ED, call 911, or call MD for any changes in condition.  Patient escorted via WC, and D/C home via private auto.  Camillo FlamingVicki L Meryl Hubers, RN 09/21/2016 2:50 PM

## 2016-09-22 LAB — CD4/CD8 (T-HELPER/T-SUPPRESSOR CELL)
CD4 absolute: 20 /uL — ABNORMAL LOW (ref 500–1900)
CD4%: 2 % — ABNORMAL LOW (ref 30.0–60.0)
CD8 T CELL ABS: 560 /uL (ref 230–1000)
CD8TOX: 60 % — AB (ref 15.0–40.0)
RATIO: 0.04 — AB (ref 1.0–3.0)
TOTAL LYMPHOCYTE COUNT: 929 /uL — AB (ref 1000–4000)

## 2016-09-22 MED FILL — Human Papillomavirus (HPV) Quadrivalent Recombinant Vac Inj: INTRAMUSCULAR | Qty: 0.5 | Status: AC

## 2016-09-23 LAB — AEROBIC/ANAEROBIC CULTURE W GRAM STAIN (SURGICAL/DEEP WOUND)

## 2016-09-25 ENCOUNTER — Encounter: Payer: Self-pay | Admitting: Infectious Disease

## 2016-09-25 LAB — REFLEX TO GENOSURE(R) MG: HIV GENOSURE(R) MG PDF: 0

## 2016-09-25 LAB — HIV-1 RNA ULTRAQUANT REFLEX TO GENTYP+
HIV-1 RNA BY PCR: 241000 copies/mL
HIV-1 RNA QUANT, LOG: 5.382 {Log_copies}/mL

## 2016-09-27 ENCOUNTER — Other Ambulatory Visit: Payer: Self-pay | Admitting: *Deleted

## 2016-09-27 DIAGNOSIS — B20 Human immunodeficiency virus [HIV] disease: Secondary | ICD-10-CM

## 2016-09-27 MED ORDER — DOLUTEGRAVIR SODIUM 50 MG PO TABS: 50.0000 mg | ORAL_TABLET | Freq: Every day | ORAL | 5 refills | Status: DC

## 2016-09-27 MED ORDER — EMTRICITABINE-TENOFOVIR AF 200-25 MG PO TABS: 1.0000 | ORAL_TABLET | Freq: Every day | ORAL | 5 refills | Status: DC

## 2016-09-27 MED ORDER — DARUNAVIR-COBICISTAT 800-150 MG PO TABS: 1.0000 | ORAL_TABLET | Freq: Every day | ORAL | 5 refills | Status: DC

## 2016-09-28 ENCOUNTER — Other Ambulatory Visit: Payer: Self-pay | Admitting: Infectious Disease

## 2016-09-28 ENCOUNTER — Other Ambulatory Visit (INDEPENDENT_AMBULATORY_CARE_PROVIDER_SITE_OTHER): Payer: Self-pay

## 2016-09-28 DIAGNOSIS — Z79899 Other long term (current) drug therapy: Secondary | ICD-10-CM

## 2016-09-29 LAB — LIPID PANEL
CHOL/HDL RATIO: 4 ratio (ref <=5.0)
Cholesterol: 125 mg/dL (ref 125–200)
HDL: 31 mg/dL — AB (ref >=40)
LDL Cholesterol: 70 mg/dL (ref <130)
Triglycerides: 122 mg/dL (ref <150)
VLDL: 24 mg/dL (ref <30)

## 2016-10-02 LAB — GENOSURE INTEGRASE HIV EDI: HIV GENOSURE PDF1: 0

## 2016-10-04 LAB — REFLEX TO GENOSURE(R) MG

## 2016-10-16 ENCOUNTER — Ambulatory Visit: Payer: Self-pay | Admitting: Infectious Disease

## 2016-12-06 ENCOUNTER — Encounter (HOSPITAL_COMMUNITY): Payer: Self-pay | Admitting: Emergency Medicine

## 2016-12-06 ENCOUNTER — Emergency Department (HOSPITAL_COMMUNITY)
Admission: EM | Admit: 2016-12-06 | Discharge: 2016-12-06 | Disposition: A | Discharge status: Home / Self Care | Payer: Self-pay | Attending: Emergency Medicine | Admitting: Emergency Medicine

## 2016-12-06 DIAGNOSIS — R197 Diarrhea, unspecified: Secondary | ICD-10-CM | POA: Insufficient documentation

## 2016-12-06 DIAGNOSIS — R112 Nausea with vomiting, unspecified: Secondary | ICD-10-CM | POA: Insufficient documentation

## 2016-12-06 DIAGNOSIS — K0889 Other specified disorders of teeth and supporting structures: Secondary | ICD-10-CM | POA: Insufficient documentation

## 2016-12-06 DIAGNOSIS — Z87891 Personal history of nicotine dependence: Secondary | ICD-10-CM | POA: Insufficient documentation

## 2016-12-06 LAB — URINALYSIS, ROUTINE W REFLEX MICROSCOPIC
Bilirubin Urine: NEGATIVE
Glucose, UA: NEGATIVE mg/dL
Hgb urine dipstick: NEGATIVE
KETONES UR: NEGATIVE mg/dL
LEUKOCYTES UA: NEGATIVE
NITRITE: NEGATIVE
PH: 6 (ref 5.0–8.0)
Protein, ur: NEGATIVE mg/dL
Specific Gravity, Urine: 1.021 (ref 1.005–1.030)

## 2016-12-06 LAB — CBC
HEMATOCRIT: 36.5 % — AB (ref 39.0–52.0)
HEMOGLOBIN: 12.5 g/dL — AB (ref 13.0–17.0)
MCH: 30.6 pg (ref 26.0–34.0)
MCHC: 34.2 g/dL (ref 30.0–36.0)
MCV: 89.2 fL (ref 78.0–100.0)
Platelets: 204 10*3/uL (ref 150–400)
RBC: 4.09 MIL/uL — ABNORMAL LOW (ref 4.22–5.81)
RDW: 13.5 % (ref 11.5–15.5)
WBC: 3 10*3/uL — AB (ref 4.0–10.5)

## 2016-12-06 LAB — LIPASE, BLOOD: Lipase: 22 U/L (ref 11–51)

## 2016-12-06 LAB — COMPREHENSIVE METABOLIC PANEL
ALT: 27 U/L (ref 17–63)
ANION GAP: 8 (ref 5–15)
AST: 46 U/L — ABNORMAL HIGH (ref 15–41)
Albumin: 3.8 g/dL (ref 3.5–5.0)
Alkaline Phosphatase: 114 U/L (ref 38–126)
BILIRUBIN TOTAL: 1.1 mg/dL (ref 0.3–1.2)
BUN: 12 mg/dL (ref 6–20)
CO2: 26 mmol/L (ref 22–32)
Calcium: 8.5 mg/dL — ABNORMAL LOW (ref 8.9–10.3)
Chloride: 103 mmol/L (ref 101–111)
Creatinine, Ser: 0.8 mg/dL (ref 0.61–1.24)
Glucose, Bld: 94 mg/dL (ref 65–99)
POTASSIUM: 3.5 mmol/L (ref 3.5–5.1)
Sodium: 137 mmol/L (ref 135–145)
TOTAL PROTEIN: 8.1 g/dL (ref 6.5–8.1)

## 2016-12-06 MED ORDER — DICYCLOMINE HCL 20 MG PO TABS: 20.0000 mg | ORAL_TABLET | Freq: Two times a day (BID) | ORAL | 0 refills | Status: DC

## 2016-12-06 MED ORDER — ONDANSETRON 4 MG PO TBDP
4.0000 mg | ORAL_TABLET | Freq: Once | ORAL | Status: AC
  Administered 2016-12-06: 4 mg via ORAL
  Filled 2016-12-06: qty 1

## 2016-12-06 MED ORDER — DICYCLOMINE HCL 10 MG PO CAPS
10.0000 mg | ORAL_CAPSULE | Freq: Once | ORAL | Status: AC
  Administered 2016-12-06: 10 mg via ORAL
  Filled 2016-12-06: qty 1

## 2016-12-06 MED ORDER — ONDANSETRON HCL 4 MG PO TABS: 4.0000 mg | ORAL_TABLET | Freq: Four times a day (QID) | ORAL | 0 refills | Status: DC

## 2016-12-06 MED ORDER — AMOXICILLIN-POT CLAVULANATE 875-125 MG PO TABS: 1.0000 | ORAL_TABLET | Freq: Two times a day (BID) | ORAL | 0 refills | Status: DC

## 2016-12-06 NOTE — ED Provider Notes (Signed)
[facility]-EMERGENCY DEPT Provider Note   CSN: [id number] Arrival date & time: 12/06/16  0542  History   Chief Complaint Chief Complaint  Patient presents with  . Gingivitis    HPI Justin Ball is a 25 y.o. male.  HPI  25 y.o. male presents to the Emergency Department today complaining of "gum infection." Pt seen earlier today at [facility] and discharged at 0200. Seen due to N/V/D as well as dental pain. Pt did not receive ABX due to no clear indication for dental infection, although pt believes this to be so. Has had ABX in the past for same. Pt warned that he could have worsening emesis and diarrhea while on ABX. Pt states that he wishes to have ABX, because this has helped him in the past with the same. Seen in September for dental abscess that required inpatient admission. Denies pain currently. No trouble swallowing. No fevers. No N/V/D. No CP/SOB/ABD. No other symptoms noted.      Past Medical History:  Diagnosis Date  . Anxiety   . Bipolar 1 disorder (HCC)   . Depression   . HIV infection (HCC)   . Neuromuscular disorder (HCC)   . Scabies   . 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 Substance abuse   . Syphilis     Patient Active Problem List   Diagnosis Date Noted  . Ulcer of soft palate 09/12/2016  . Acute hypokalemia 09/12/2016  . Anemia 09/12/2016  . Alcohol use 09/12/2016  . Oral abscess 09/12/2016  . Abscess   . AIDS (HCC) 01/19/2015  . 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 H/O noncompliance with medical treatment - finincial, presenting hazards to health 12/28/2014  . Rectal abscess 12/28/2014  . Intersphincteric abscess 12/28/2014  . Bipolar disorder (HCC) 04/08/2013  . Muscle ache 01/06/2013  . Lower back pain 01/06/2013  . Insomnia 01/06/2013  . HIV (human immunodeficiency virus infection) (HCC) 10/10/2012  . Rash and nonspecific skin eruption 10/10/2012  . Rectal discharge 10/10/2012  . Syphilis 10/10/2012    Past Surgical History:    Procedure Laterality Date  . EXAMINATION UNDER ANESTHESIA N/A 12/28/2014   Procedure: EXAM UNDER ANESTHESIA;  Surgeon: Karie Soda, MD;  Location: [facility];  Service: General;  Laterality: N/A;  . FLOOR OF MOUTH BIOPSY N/A 09/16/2016   Procedure: BIOPSY OF ORAL ABCESS;  Surgeon: Newman Pies, MD;  Location: [facility] OR;  Service: ENT;  Laterality: N/A;  . INCISION AND DRAINAGE ABSCESS N/A 09/16/2016   Procedure: INCISION AND DRAINAGE ABSCESS;  Surgeon: Newman Pies, MD;  Location: [facility] OR;  Service: ENT;  Laterality: N/A;  . INCISION AND DRAINAGE PERIRECTAL ABSCESS  08/27/2010   Dr Carolynne Edouard  . INCISION AND DRAINAGE PERIRECTAL ABSCESS N/A 12/28/2014   Procedure: IRRIGATION AND DEBRIDEMENT PERIRECTAL ABSCESS;  Surgeon: Karie Soda, MD;  Location: [facility];  Service: General;  Laterality: N/A;  Fistula repair and ablation       Home Medications    Prior to Admission medications   Medication Sig Start Date End Date Taking? Authorizing Provider  amoxicillin-clavulanate (AUGMENTIN) 875-125 MG tablet Take 1 tablet by mouth every 12 (twelve) hours. Patient not taking: Reported on 12/06/2016 09/20/16   Catarina Hartshorn, MD  darunavir-cobicistat (PREZCOBIX) 800-150 MG tablet Take 1 tablet by mouth daily with breakfast. Swallow whole. Do NOT crush, break or chew tablets. Take with food. 09/27/16   Randall Hiss, MD  dicyclomine (BENTYL) 20 MG tablet Take 1 tablet (20 mg total) by mouth 2 (two)  times daily. 12/06/16   Maia PlanJoshua G Long, MD  dolutegravir (TIVICAY) 50 MG tablet Take 1 tablet (50 mg total) by mouth daily. 09/27/16   Randall Hissornelius N Van Dam, MD  emtricitabine-tenofovir AF (DESCOVY) 200-25 MG tablet Take 1 tablet by mouth daily. 09/27/16   Randall Hissornelius N Van Dam, MD  feeding supplement (BOOST / RESOURCE BREEZE) LIQD Take 1 Container by mouth 3 (three) times daily between meals. 09/21/16   Catarina Hartshornavid Tat, MD  fluconazole (DIFLUCAN) 200 MG tablet Take 2 tablets (400 mg total) by mouth daily. 09/21/16   Catarina Hartshornavid Tat, MD  ondansetron (ZOFRAN) 4 MG  tablet Take 1 tablet (4 mg total) by mouth every 6 (six) hours. 12/06/16   Maia PlanJoshua G Long, MD  sulfamethoxazole-trimethoprim (BACTRIM DS,SEPTRA DS) 800-160 MG tablet Take 1 tablet by mouth 3 (three) times a week. 09/22/16   Catarina Hartshornavid Tat, MD    Family History Family History  Problem Relation Age of Onset  . Hypertension Other     Social History Social History  Substance Use Topics  . Smoking status: Former Smoker    Packs/day: 0.25    Years: 5.00    Types: Cigarettes    Quit date: 08/12/2016  . Smokeless tobacco: Never Used  . Alcohol use 0.0 oz/week     Comment: occasionally      Allergies   Patient has no known allergies.   Review of Systems Review of Systems  Constitutional: Negative for fever.  HENT: Positive for dental problem. Negative for trouble swallowing and voice change.   Gastrointestinal: Negative for nausea and vomiting.   Physical Exam Updated Vital Signs BP 127/91 (BP Location: Right Arm)   Pulse 73   Temp 99.2 F (37.3 C) (Oral)   Resp 22   Ht 6\' 1"  (1.854 m)   Wt 68 kg   SpO2 99%   BMI 19.79 kg/m   Physical Exam  Constitutional: He is oriented to person, place, and time. Vital signs are normal. He appears well-developed and well-nourished.  HENT:  Head: Normocephalic and atraumatic.  Right Ear: Hearing normal.  Left Ear: Hearing normal.  Mouth/Throat: Uvula is midline, oropharynx is clear and moist and mucous membranes are normal. No trismus in the jaw. No dental abscesses. No oropharyngeal exudate, posterior oropharyngeal edema or posterior oropharyngeal erythema.  Gingiva below lower frontal dentition with mild purulence. Specks of black noted. Malodorous. No signs of abscess. No trismus. Phonating well. No ludwigs.   Eyes: Conjunctivae and EOM are normal. Pupils are equal, round, and reactive to light.  Neck: Normal range of motion. Neck supple.  Cardiovascular: Normal rate and regular rhythm.   Pulmonary/Chest: Effort normal.  Neurological: He  is alert and oriented to person, place, and time.  Skin: Skin is warm and dry.  Psychiatric: He has a normal mood and affect. His speech is normal and behavior is normal. Thought content normal.  Nursing note and vitals reviewed.  ED Treatments / Results  Labs (all labs ordered are listed, but only abnormal results are displayed) Labs Reviewed - No data to display  EKG  EKG Interpretation None      Radiology No results found.  Procedures Procedures (including critical care time)  Medications Ordered in ED Medications - No data to display   Initial Impression / Assessment and Plan / ED Course  I have reviewed the triage vital signs and the nursing notes.  Pertinent labs & imaging results that were available during my care of the patient were reviewed by me and  considered in my medical decision making (see chart for details).  Clinical Course    Final Clinical Impressions(s) / ED Diagnoses     {I have reviewed the relevant previous healthcare records.  {I obtained HPI from historian.   ED Course:  Assessment: Dental pain associated with dental cary but no signs or symptoms of dental abscess with patient afebrile, non toxic appearing and swallowing secretions well. Exam unconcerning for Ludwig's angina or other deep tissue infection in neck. There is no facial swelling or gum findings. There is odor to area as well as slight black appearing gingiva. Will Rx ABX and follow up with dentist. Counseled on ABX usage and benefits/risks associated with such. Counseled on possible worsening diarrhea and symptoms. Pt accepts risk and agrees with follow up to Dentist I gave patient referral to dentist and stressed the importance of dental follow up for ultimate management of dental pain. Patient voices understanding and is agreeable to plan.  Disposition/Plan:  DC Home Additional Verbal discharge instructions given and discussed with patient.  Pt Instructed to f/u with PCP in the next  week for evaluation and treatment of symptoms. Return precautions given Pt acknowledges and agrees with plan  Supervising Physician Dione Boozeavid Glick, MD  Final diagnoses:  Pain, dental    New Prescriptions New Prescriptions   No medications on file     Audry Piliyler Trishia Cuthrell, PA-C 12/06/16 0617    Dione Boozeavid Glick, MD 12/06/16 [phone]

## 2016-12-06 NOTE — Discharge Instructions (Signed)
Please read and follow all provided instructions.  Your diagnoses today include:  1. Pain, dental     Tests performed today include: Vital signs. See below for your results today.   Medications prescribed:  Take as prescribed   Home care instructions:  Follow any educational materials contained in this packet.  Follow-up instructions: Please follow-up with your Dentist for further evaluation of symptoms and treatment   Please Contact this number: [phone] to get into contact with an emergency dental service to schedule an appointment with a local dentist   Return instructions:  Please return to the Emergency Department if you do not get better, if you get worse, or new symptoms OR  - Fever (temperature greater than 101.46F)  - Bleeding that does not stop with holding pressure to the area    -Severe pain (please note that you may be more sore the day after your accident)  - Chest Pain  - Difficulty breathing  - Severe nausea or vomiting  - Inability to tolerate food and liquids  - Passing out  - Skin becoming red around your wounds  - Change in mental status (confusion or lethargy)  - New numbness or weakness    Please return if you have any other emergent concerns.  Additional Information:  Your vital signs today were: BP 107/69    Pulse 74    Temp 99.2 F (37.3 C) (Oral)    Resp 22    Ht 6\' 1"  (1.854 m)    Wt 68 kg    SpO2 99%    BMI 19.79 kg/m  If your blood pressure (BP) was elevated above 135/85 this visit, please have this repeated by your doctor within one month. ---------------

## 2016-12-06 NOTE — ED Provider Notes (Signed)
Emergency Department Provider Note   I have reviewed the triage vital signs and the nursing notes.  By signing my name below, I, Nelwyn SalisburyJoshua Fowler, attest that this documentation has been prepared under the direction and in the presence of Maia PlanJoshua G Lason Eveland, MD . Electronically Signed: Nelwyn SalisburyJoshua Fowler, Scribe. 12/06/2016. 2:11 AM.  HISTORY  Chief Complaint Emesis; Diarrhea; and Dental Pain   HPI  Justin Ball is a 25 y.o. male with pmhx of HIV who presents to the Emergency Department complaining of sudden-onset unchanged vomiting and diarrhea beginning earlier today. Pt states that he has been vomiting while simultaneously having diarrhea. This has occurred 3 times today. No modifying factors indicated. He reports associated brown discharge from his gums, rectal pain and "scar tissue" in his throat. He denies any abdominal pain, chest pain, or trouble breathing. Pt states his grandmother and coworkers are also sick.   Past Medical History:  Diagnosis Date  . Anxiety   . Bipolar 1 disorder (HCC)   . Depression   . HIV infection (HCC)   . Neuromuscular disorder (HCC)   . Scabies   . 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 Substance abuse   . Syphilis     Patient Active Problem List   Diagnosis Date Noted  . Ulcer of soft palate 09/12/2016  . Acute hypokalemia 09/12/2016  . Anemia 09/12/2016  . Alcohol use 09/12/2016  . Oral abscess 09/12/2016  . Abscess   . AIDS (HCC) 01/19/2015  . 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 H/O noncompliance with medical treatment - finincial, presenting hazards to health 12/28/2014  . Rectal abscess 12/28/2014  . Intersphincteric abscess 12/28/2014  . Bipolar disorder (HCC) 04/08/2013  . Muscle ache 01/06/2013  . Lower back pain 01/06/2013  . Insomnia 01/06/2013  . HIV (human immunodeficiency virus infection) (HCC) 10/10/2012  . Rash and nonspecific skin eruption 10/10/2012  . Rectal discharge 10/10/2012  . Syphilis  10/10/2012    Past Surgical History:  Procedure Laterality Date  . EXAMINATION UNDER ANESTHESIA N/A 12/28/2014   Procedure: EXAM UNDER ANESTHESIA;  Surgeon: Karie SodaSteven Gross, MD;  Location: [facility];  Service: General;  Laterality: N/A;  . FLOOR OF MOUTH BIOPSY N/A 09/16/2016   Procedure: BIOPSY OF ORAL ABCESS;  Surgeon: Newman PiesSu Teoh, MD;  Location: [facility] OR;  Service: ENT;  Laterality: N/A;  . INCISION AND DRAINAGE ABSCESS N/A 09/16/2016   Procedure: INCISION AND DRAINAGE ABSCESS;  Surgeon: Newman PiesSu Teoh, MD;  Location: [facility] OR;  Service: ENT;  Laterality: N/A;  . INCISION AND DRAINAGE PERIRECTAL ABSCESS  08/27/2010   Dr Carolynne Edouardoth  . INCISION AND DRAINAGE PERIRECTAL ABSCESS N/A 12/28/2014   Procedure: IRRIGATION AND DEBRIDEMENT PERIRECTAL ABSCESS;  Surgeon: Karie SodaSteven Gross, MD;  Location: [facility];  Service: General;  Laterality: N/A;  Fistula repair and ablation    Current Outpatient Rx  . Order #: [id number] Class: Print  . Order #: [id number] Class: Print  . Order #: [id number] Class: Print  . Order #: [id number] Class: Print  . Order #: [id number] Class: Print  . Order #: [id number] Class: Print  . Order #: [id number] Class: Print  . Order #: [id number] Class: Print  . Order #: [id number] Class: Print    Allergies Patient has no known allergies.  Family History  Problem Relation Age of Onset  . Hypertension Other     Social History Social History  Substance Use Topics  . Smoking status: Former Smoker    Packs/day: 0.25    Years: 5.00    Types: Cigarettes  Quit date: 08/12/2016  . Smokeless tobacco: Never Used  . Alcohol use 0.0 oz/week     Comment: occasionally     Review of Systems Constitutional: No fever/chills Eyes: No visual changes. ENT: Discharge from Gums. No sore throat. Cardiovascular: Denies chest pain. Respiratory: Denies shortness of breath. Gastrointestinal: Vomiting. Diarrhea. No abdominal pain.  No nausea. No constipation. Genitourinary: Rectal pain. Negative for dysuria. Musculoskeletal:  Negative for back pain. Skin: Negative for rash. Neurological: Negative for headaches, focal weakness or numbness.   10-point ROS otherwise negative.  ____________________________________________   PHYSICAL EXAM:  VITAL SIGNS: ED Triage Vitals  Enc Vitals Group     BP 12/06/16 0151 138/86     Pulse Rate 12/06/16 0151 95     Resp 12/06/16 0151 20     Temp 12/06/16 0151 98.1 F (36.7 C)     Temp Source 12/06/16 0151 Oral     SpO2 12/06/16 0151 99 %     Pain Score 12/06/16 0203 10   Constitutional: Alert and oriented. Well appearing and in no acute distress. Eyes: Conjunctivae are normal.  Head: Atraumatic. Nose: No congestion/rhinnorhea. Mouth/Throat: Mucous membranes are moist.  Oropharynx non-erythematous. Neck: No stridor.   Cardiovascular: Normal rate, regular rhythm. Good peripheral circulation. Grossly normal heart sounds.   Respiratory: Normal respiratory effort.  No retractions. Lungs CTAB. Gastrointestinal: Soft and nontender. No distention. Small amount of skin irritation immediately surrounding the anus. No masses or areas of fluctuance.  Musculoskeletal: No lower extremity tenderness nor edema. No gross deformities of extremities. Neurologic:  Normal speech and language. No gross focal neurologic deficits are appreciated.  Skin:  Skin is warm, dry and intact. No rash noted.  ____________________________________________   LABS (all labs ordered are listed, but only abnormal results are displayed)  Labs Reviewed  COMPREHENSIVE METABOLIC PANEL - Abnormal; Notable for the following:       Result Value   Calcium 8.5 (*)    AST 46 (*)    All other components within normal limits  CBC - Abnormal; Notable for the following:    WBC 3.0 (*)    RBC 4.09 (*)    Hemoglobin 12.5 (*)    HCT 36.5 (*)    All other components within normal limits  LIPASE, BLOOD  URINALYSIS, ROUTINE W REFLEX MICROSCOPIC    ____________________________________________   PROCEDURES  Procedure(s) performed:   Procedures  None ____________________________________________   INITIAL IMPRESSION / ASSESSMENT AND PLAN / ED COURSE  Pertinent labs & imaging results that were available during my care of the patient were reviewed by me and considered in my medical decision making (see chart for details).  Patient resents the emergency department for evaluation of nausea, vomiting, diarrhea. He has some associated oral and rectal pain. No evidence of acute infection on oral or rectal exam. Suspect that the patient has rectal irritation from multiple episodes of diarrhea. The patient has a soft abdomen and normal vital signs. Labs are reviewed and largely unremarkable. Patient is requesting an antibiotic as he has received antibiotics for his mouth symptoms in the past. No clear indication for antibiotics at this time as patient has no evidence of a bacterial infection. I discussed them also concerned that giving him antibiotics may worsen his nausea, vomiting, diarrhea. Plan for discharge with Zofran and Bentyl and dehydration precautions. He is tolerating PO at discharge and is overall very well appearing.   At this time, I do not feel there is any life-threatening condition present. I have reviewed  and discussed all results (EKG, imaging, lab, urine as appropriate), exam findings with patient. I have reviewed nursing notes and appropriate previous records.  I feel the patient is safe to be discharged home without further emergent workup. Discussed usual and customary return precautions. Patient and family (if present) verbalize understanding and are comfortable with this plan.  Patient will follow-up with their primary care provider. If they do not have a primary care provider, information for follow-up has been provided to them. All questions have been answered.  ____________________________________________  FINAL  CLINICAL IMPRESSION(S) / ED DIAGNOSES  Final diagnoses:  Nausea vomiting and diarrhea     MEDICATIONS GIVEN DURING THIS VISIT:  Medications  ondansetron (ZOFRAN-ODT) disintegrating tablet 4 mg (4 mg Oral Given 12/06/16 0257)  dicyclomine (BENTYL) capsule 10 mg (10 mg Oral Given 12/06/16 0257)     NEW OUTPATIENT MEDICATIONS STARTED DURING THIS VISIT:  Discharge Medication List as of 12/06/2016  3:56 AM    START taking these medications   Details  dicyclomine (BENTYL) 20 MG tablet Take 1 tablet (20 mg total) by mouth 2 (two) times daily., Starting Wed 12/06/2016, Print    ondansetron (ZOFRAN) 4 MG tablet Take 1 tablet (4 mg total) by mouth every 6 (six) hours., Starting Wed 12/06/2016, Print        Note:  This document was prepared using Dragon voice recognition software and may include unintentional dictation errors.  Alona BeneJoshua Habib Kise, MD Emergency Medicine  I personally performed the services described in this documentation, which was scribed in my presence. The recorded information has been reviewed and is accurate.       Maia PlanJoshua G Lonnie Rosado, MD 12/06/16 [phone]

## 2016-12-06 NOTE — ED Triage Notes (Signed)
Pt comes in with complaints with emesis and diarrhea x3 today. Endorses rectal pain from diarrhea and states he has some bumps around the anus.  Denies bleeding.  States he has gum pain that feels like a burning sensation. Brown discharge noted around gums. A&O x4.  Ambulatory in triage. Afebrile.

## 2016-12-06 NOTE — Discharge Instructions (Signed)

## 2016-12-06 NOTE — ED Triage Notes (Signed)
Per pt[facility] Pt requesting antibiotic.

## 2017-02-17 ENCOUNTER — Encounter (HOSPITAL_COMMUNITY): Payer: Self-pay | Admitting: Emergency Medicine

## 2017-02-17 ENCOUNTER — Emergency Department (HOSPITAL_COMMUNITY)
Admission: EM | Admit: 2017-02-17 | Discharge: 2017-02-17 | Disposition: A | Discharge status: Home / Self Care | Payer: Self-pay | Attending: Emergency Medicine | Admitting: Emergency Medicine

## 2017-02-17 DIAGNOSIS — Z87891 Personal history of nicotine dependence: Secondary | ICD-10-CM | POA: Insufficient documentation

## 2017-02-17 DIAGNOSIS — Z79899 Other long term (current) drug therapy: Secondary | ICD-10-CM | POA: Insufficient documentation

## 2017-02-17 DIAGNOSIS — K0889 Other specified disorders of teeth and supporting structures: Secondary | ICD-10-CM | POA: Insufficient documentation

## 2017-02-17 DIAGNOSIS — A630 Anogenital (venereal) warts: Secondary | ICD-10-CM | POA: Insufficient documentation

## 2017-02-17 MED ORDER — HYDROCODONE-ACETAMINOPHEN 5-325 MG PO TABS
1.0000 | ORAL_TABLET | Freq: Once | ORAL | Status: AC
  Administered 2017-02-17: 1 via ORAL
  Filled 2017-02-17: qty 1

## 2017-02-17 MED ORDER — PENICILLIN V POTASSIUM 250 MG PO TABS
500.0000 mg | ORAL_TABLET | Freq: Once | ORAL | Status: AC
  Administered 2017-02-17: 500 mg via ORAL
  Filled 2017-02-17: qty 2

## 2017-02-17 MED ORDER — ACETAMINOPHEN 325 MG PO TABS
650.0000 mg | ORAL_TABLET | Freq: Four times a day (QID) | ORAL | 0 refills | Status: DC | PRN
Start: 2017-02-17 — End: 2017-04-18

## 2017-02-17 MED ORDER — PENICILLIN V POTASSIUM 500 MG PO TABS: 500.0000 mg | ORAL_TABLET | Freq: Four times a day (QID) | ORAL | 0 refills | Status: AC

## 2017-02-17 NOTE — ED Notes (Signed)
Pt verbalized understanding discharge instructions and denies any further needs or questions at this time. VS stable, ambulatory and steady gait.   

## 2017-02-17 NOTE — ED Triage Notes (Signed)
Pt presents to ED for assessment of "gum pain" after recently being diagnosed with peridontitis.  Patient also states he is having rectal irritation with "sores".  Patient has been seen for the same in the past.  States he is needing an antibiotic.

## 2017-02-17 NOTE — ED Provider Notes (Signed)
[facility]-EM[facility]ENCY DEPT Provider Note   CSN: [id number] Arrival date & time: 02/17/17  0203     History   Chief Complaint Chief Complaint  Patient presents with  . Dental Pain  . Rectal Pain    HPI Justin Ball is a 26 y.o. male.  The history is provided by the patient.  Dental Pain   This is a recurrent problem. The current episode started more than 2 days ago. The problem occurs daily. The problem has been gradually worsening. The pain is severe.  patient presents for 2 complaints:  1. He reports increased dental pain due to severe gingival disease.  He reports he has seen dentristy and needs some teeth extracted.  No fever/vomiting.  No difficulty swallowing or facial swelling  2. He also reports rectal pain.  He reports sores and drainage around his rectum .  He has had this previously.  He reports this usually flares up when he has dental pain. No recent anal trauma.  Denies anal intercourse   Past Medical History:  Diagnosis Date  . Anxiety   . Bipolar 1 disorder (HCC)   . Depression   . HIV infection (HCC)   . Neuromuscular disorder (HCC)   . Scabies   . 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 Substance abuse   . Syphilis     Patient Active Problem List   Diagnosis Date Noted  . Ulcer of soft palate 09/12/2016  . Acute hypokalemia 09/12/2016  . Anemia 09/12/2016  . Alcohol use 09/12/2016  . Oral abscess 09/12/2016  . Abscess   . AIDS (HCC) 01/19/2015  . 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 H/O noncompliance with medical treatment - finincial, presenting hazards to health 12/28/2014  . Rectal abscess 12/28/2014  . Intersphincteric abscess 12/28/2014  . Bipolar disorder (HCC) 04/08/2013  . Muscle ache 01/06/2013  . Lower back pain 01/06/2013  . Insomnia 01/06/2013  . HIV (human immunodeficiency virus infection) (HCC) 10/10/2012  . Rash and nonspecific skin eruption 10/10/2012  . Rectal discharge 10/10/2012  . Syphilis 10/10/2012     Past Surgical History:  Procedure Laterality Date  . EXAMINATION UNDER ANESTHESIA N/A 12/28/2014   Procedure: EXAM UNDER ANESTHESIA;  Surgeon: Justin Soda, Ball;  Location: [facility];  Service: General;  Laterality: N/A;  . FLOOR OF MOUTH BIOPSY N/A 09/16/2016   Procedure: BIOPSY OF ORAL ABCESS;  Surgeon: Justin Pies, Ball;  Location: [facility];  Service: ENT;  Laterality: N/A;  . INCISION AND DRAINAGE ABSCESS N/A 09/16/2016   Procedure: INCISION AND DRAINAGE ABSCESS;  Surgeon: Justin Pies, Ball;  Location: [facility];  Service: ENT;  Laterality: N/A;  . INCISION AND DRAINAGE PERIRECTAL ABSCESS  08/27/2010   Justin Ball  . INCISION AND DRAINAGE PERIRECTAL ABSCESS N/A 12/28/2014   Procedure: IRRIGATION AND DEBRIDEMENT PERIRECTAL ABSCESS;  Surgeon: Justin Soda, Ball;  Location: [facility];  Service: General;  Laterality: N/A;  Fistula repair and ablation       Home Medications    Prior to Admission medications   Medication Sig Start Date End Date Taking? Authorizing Provider  amoxicillin-clavulanate (AUGMENTIN) 875-125 MG tablet Take 1 tablet by mouth every 12 (twelve) hours. 12/06/16   Justin Ball  darunavir-cobicistat (PREZCOBIX) 800-150 MG tablet Take 1 tablet by mouth daily with breakfast. Swallow whole. Do NOT crush, break or chew tablets. Take with food. 09/27/16   Justin Hiss, Ball  dicyclomine (BENTYL) 20 MG tablet Take 1 tablet (20 mg total) by mouth 2 (  two) times daily. 12/06/16   Justin Ball  dolutegravir (TIVICAY) 50 MG tablet Take 1 tablet (50 mg total) by mouth daily. 09/27/16   Justin Justin N Van Dam, Ball  emtricitabine-tenofovir AF (DESCOVY) 200-25 MG tablet Take 1 tablet by mouth daily. 09/27/16   Justin Justin N Van Dam, Ball  feeding supplement (BOOST / RESOURCE BREEZE) LIQD Take 1 Container by mouth 3 (three) times daily between meals. 09/21/16   Justin Hartshornavid Tat, Ball  fluconazole (DIFLUCAN) 200 MG tablet Take 2 tablets (400 mg total) by mouth daily. 09/21/16   Justin Hartshornavid Tat, Ball  ondansetron (ZOFRAN) 4 MG tablet  Take 1 tablet (4 mg total) by mouth every 6 (six) hours. 12/06/16   Justin Ball  sulfamethoxazole-trimethoprim (BACTRIM DS,SEPTRA DS) 800-160 MG tablet Take 1 tablet by mouth 3 (three) times a week. 09/22/16   Justin Hartshornavid Tat, Ball    Family History Family History  Problem Relation Age of Onset  . Hypertension Other     Social History Social History  Substance Use Topics  . Smoking status: Former Smoker    Packs/day: 0.25    Years: 5.00    Types: Cigarettes    Quit date: 08/12/2016  . Smokeless tobacco: Never Used  . Alcohol use 0.0 oz/week     Comment: occasionally      Allergies   Patient has no known allergies.   Review of Systems Review of Systems  Constitutional: Negative for fever.  Gastrointestinal: Positive for rectal pain. Negative for vomiting.  All other systems reviewed and are negative.    Physical Exam Updated Vital Signs BP 109/59   Pulse 102   Temp 98.1 F (36.7 C) (Oral)   Resp 19   SpO2 98%   Physical Exam CONSTITUTIONAL: Well developed/well nourished HEAD: Normocephalic/atraumatic EYES: EOMI/PERRL ENMT: Mucous membranes moist, poor dentition, significant gingival disease noted but no intra-oral abscess.  No trismus.  Voice normal.  Diffuse tenderness to teeth.   NECK: supple no meningeal signsr CV: S1/S2 noted, no murmurs/rubs/gallops noted LUNGS: Lungs are clear to auscultation bilaterally, no apparent distress ABDOMEN: soft, nontender Rectal - chaperone present [name](Judith, Tech present for exam) Pt with rectal condyloma.  No abscess/induration.  No blood noted.  No large masses noted NEURO: Pt is awake/alert/appropriate, moves all extremitiesx4.  No facial droop.   EXTREMITIES:  full ROM SKIN: warm, color normal PSYCH: anxious  ED Treatments / Results  Labs (all labs ordered are listed, but only abnormal results are displayed) Labs Reviewed - No data to display  EKG  EKG Interpretation None       Radiology No results  found.  Procedures Procedures (including critical care time)  Medications Ordered in ED Medications  HYDROcodone-acetaminophen (NORCO/VICODIN) 5-325 MG per tablet 1 tablet (1 tablet Oral Given 02/17/17 0429)  penicillin v potassium (VEETID) tablet 500 mg (500 mg Oral Given 02/17/17 0429)     Initial Impression / Assessment and Plan / ED Course  I have reviewed the triage vital signs and the nursing notes.   Pt here with dental pain - will start antibiotics For rectal pain, pt with condyloma but no obvious abscess He has h/o HIV and reports med compliance but he has had questionable compliance in past.  Advised outpatient f/u   Final Clinical Impressions(s) / ED Diagnoses   Final diagnoses:  Pain, dental  Condyloma acuminatum    New Prescriptions New Prescriptions   ACETAMINOPHEN (TYLENOL) 325 MG TABLET    Take 2 tablets (650 mg total) by  mouth every 6 (six) hours as needed.   PENICILLIN V POTASSIUM (VEETID) 500 MG TABLET    Take 1 tablet (500 mg total) by mouth 4 (four) times daily.     Zadie Rhine, Ball 02/17/17 [phone]

## 2017-02-21 ENCOUNTER — Telehealth: Payer: Self-pay | Admitting: *Deleted

## 2017-02-21 NOTE — Telephone Encounter (Signed)
Release for MD recommendation faxed to [facility] surgery at [phone]. Snapshot of meds and allergies also sent.

## 2017-03-03 ENCOUNTER — Encounter (HOSPITAL_COMMUNITY): Payer: Self-pay | Admitting: Emergency Medicine

## 2017-03-03 ENCOUNTER — Emergency Department (HOSPITAL_COMMUNITY)
Admission: EM | Admit: 2017-03-03 | Discharge: 2017-03-03 | Disposition: A | Discharge status: Home / Self Care | Payer: Self-pay | Attending: Emergency Medicine | Admitting: Emergency Medicine

## 2017-03-03 ENCOUNTER — Emergency Department (HOSPITAL_COMMUNITY): Payer: Self-pay

## 2017-03-03 DIAGNOSIS — K602 Anal fissure, unspecified: Secondary | ICD-10-CM | POA: Insufficient documentation

## 2017-03-03 DIAGNOSIS — B20 Human immunodeficiency virus [HIV] disease: Secondary | ICD-10-CM

## 2017-03-03 DIAGNOSIS — Z21 Asymptomatic human immunodeficiency virus [HIV] infection status: Secondary | ICD-10-CM | POA: Insufficient documentation

## 2017-03-03 DIAGNOSIS — Z79899 Other long term (current) drug therapy: Secondary | ICD-10-CM | POA: Insufficient documentation

## 2017-03-03 DIAGNOSIS — Z87891 Personal history of nicotine dependence: Secondary | ICD-10-CM | POA: Insufficient documentation

## 2017-03-03 DIAGNOSIS — Z9114 Patient's other noncompliance with medication regimen: Secondary | ICD-10-CM | POA: Insufficient documentation

## 2017-03-03 LAB — CBC WITH DIFFERENTIAL/PLATELET
BASOS ABS: 0 10*3/uL (ref 0.0–0.1)
Basophils Relative: 1 %
EOS ABS: 0.2 10*3/uL (ref 0.0–0.7)
Eosinophils Relative: 6 %
HCT: 37.9 % — ABNORMAL LOW (ref 39.0–52.0)
Hemoglobin: 12.9 g/dL — ABNORMAL LOW (ref 13.0–17.0)
LYMPHS PCT: 20 %
Lymphs Abs: 0.6 10*3/uL — ABNORMAL LOW (ref 0.7–4.0)
MCH: 31.2 pg (ref 26.0–34.0)
MCHC: 34 g/dL (ref 30.0–36.0)
MCV: 91.8 fL (ref 78.0–100.0)
MONOS PCT: 20 %
Monocytes Absolute: 0.6 10*3/uL (ref 0.1–1.0)
NEUTROS PCT: 53 %
Neutro Abs: 1.6 10*3/uL — ABNORMAL LOW (ref 1.7–7.7)
PLATELETS: 154 10*3/uL (ref 150–400)
RBC: 4.13 MIL/uL — AB (ref 4.22–5.81)
RDW: 13.8 % (ref 11.5–15.5)
WBC MORPHOLOGY: INCREASED
WBC: 3 10*3/uL — ABNORMAL LOW (ref 4.0–10.5)

## 2017-03-03 LAB — I-STAT CG4 LACTIC ACID, ED: LACTIC ACID, VENOUS: 1.79 mmol/L (ref 0.5–1.9)

## 2017-03-03 LAB — BASIC METABOLIC PANEL
ANION GAP: 11 (ref 5–15)
BUN: 8 mg/dL (ref 6–20)
CO2: 24 mmol/L (ref 22–32)
Calcium: 8.5 mg/dL — ABNORMAL LOW (ref 8.9–10.3)
Chloride: 106 mmol/L (ref 101–111)
Creatinine, Ser: 0.71 mg/dL (ref 0.61–1.24)
GFR calc Af Amer: >60 mL/min (ref >60)
GFR calc non Af Amer: >60 mL/min (ref >60)
Glucose, Bld: 69 mg/dL (ref 65–99)
POTASSIUM: 3.3 mmol/L — AB (ref 3.5–5.1)
SODIUM: 141 mmol/L (ref 135–145)

## 2017-03-03 LAB — POC OCCULT BLOOD, ED: FECAL OCCULT BLD: POSITIVE — AB

## 2017-03-03 MED ORDER — IOPAMIDOL (ISOVUE-300) INJECTION 61%
INTRAVENOUS | Status: AC
Start: 2017-03-03 — End: 2017-03-03
  Administered 2017-03-03: 100 mL
  Filled 2017-03-03: qty 100

## 2017-03-03 MED ORDER — MORPHINE SULFATE (PF) 4 MG/ML IV SOLN
4.0000 mg | Freq: Once | INTRAVENOUS | Status: AC
  Administered 2017-03-03: 4 mg via INTRAVENOUS
  Filled 2017-03-03 (×2): qty 1

## 2017-03-03 MED ORDER — CLINDAMYCIN PHOSPHATE 600 MG/50ML IV SOLN
600.0000 mg | Freq: Once | INTRAVENOUS | Status: AC
  Administered 2017-03-03: 600 mg via INTRAVENOUS
  Filled 2017-03-03: qty 50

## 2017-03-03 MED ORDER — LIDOCAINE 5 % EX OINT: 1.0000 "application " | TOPICAL_OINTMENT | CUTANEOUS | 0 refills | Status: DC | PRN

## 2017-03-03 MED ORDER — HYDROCODONE-ACETAMINOPHEN 5-325 MG PO TABS
1.0000 | ORAL_TABLET | Freq: Once | ORAL | Status: AC
  Administered 2017-03-03: 1 via ORAL
  Filled 2017-03-03: qty 1

## 2017-03-03 MED ORDER — NITROGLYCERIN 0.4 % RE OINT: 1.0000 [in_us] | TOPICAL_OINTMENT | Freq: Two times a day (BID) | RECTAL | 0 refills | Status: DC

## 2017-03-03 MED ORDER — CLINDAMYCIN HCL 300 MG PO CAPS: 300.0000 mg | ORAL_CAPSULE | Freq: Three times a day (TID) | ORAL | 0 refills | Status: DC

## 2017-03-03 MED ORDER — ONDANSETRON HCL 4 MG/2ML IJ SOLN
4.0000 mg | Freq: Once | INTRAMUSCULAR | Status: AC
  Administered 2017-03-03: 4 mg via INTRAVENOUS
  Filled 2017-03-03: qty 2

## 2017-03-03 NOTE — ED Provider Notes (Signed)
[facility] Provider Note   CSN: [id number] Arrival date & time: 03/03/17  0555     History   Chief Complaint Chief Complaint  Patient presents with  . Abscess    HPI Justin Ball is a 26 y.o. male.  Pt presents to the ED with a possible rectal abscess.  The pt has a hx of HIV, but is not compliant with his medications.  At his last admission in September of 2017, his CD4 count was 20.  The pt has had problems with rectal abscesses in the past.  Pt c/o severe pain after defecation.  He has also had bloody/brownish discharge from his bottom.  The pt was here on 2/24 for the same.  He was put on penvk for a dental abscess and told to f/u with ID for his rectal pain.  The pt has not followed up.        Past Medical History:  Diagnosis Date  . Anxiety   . Bipolar 1 disorder (HCC)   . Depression   . HIV infection (HCC)   . Neuromuscular disorder (HCC)   . Scabies   . 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 Substance abuse   . Syphilis     Patient Active Problem List   Diagnosis Date Noted  . Ulcer of soft palate 09/12/2016  . Acute hypokalemia 09/12/2016  . Anemia 09/12/2016  . Alcohol use 09/12/2016  . Oral abscess 09/12/2016  . Abscess   . AIDS (HCC) 01/19/2015  . 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 H/O noncompliance with medical treatment - finincial, presenting hazards to health 12/28/2014  . Rectal abscess 12/28/2014  . Intersphincteric abscess 12/28/2014  . Bipolar disorder (HCC) 04/08/2013  . Muscle ache 01/06/2013  . Lower back pain 01/06/2013  . Insomnia 01/06/2013  . HIV (human immunodeficiency virus infection) (HCC) 10/10/2012  . Rash and nonspecific skin eruption 10/10/2012  . Rectal discharge 10/10/2012  . Syphilis 10/10/2012    Past Surgical History:  Procedure Laterality Date  . EXAMINATION UNDER ANESTHESIA N/A 12/28/2014   Procedure: EXAM UNDER ANESTHESIA;  Surgeon: Karie Soda, MD;  Location: [facility];  Service:  General;  Laterality: N/A;  . FLOOR OF MOUTH BIOPSY N/A 09/16/2016   Procedure: BIOPSY OF ORAL ABCESS;  Surgeon: Newman Pies, MD;  Location: [facility];  Service: ENT;  Laterality: N/A;  . INCISION AND DRAINAGE ABSCESS N/A 09/16/2016   Procedure: INCISION AND DRAINAGE ABSCESS;  Surgeon: Newman Pies, MD;  Location: [facility];  Service: ENT;  Laterality: N/A;  . INCISION AND DRAINAGE PERIRECTAL ABSCESS  08/27/2010   Dr Carolynne Edouard  . INCISION AND DRAINAGE PERIRECTAL ABSCESS N/A 12/28/2014   Procedure: IRRIGATION AND DEBRIDEMENT PERIRECTAL ABSCESS;  Surgeon: Karie Soda, MD;  Location: [facility];  Service: General;  Laterality: N/A;  Fistula repair and ablation       Home Medications    Prior to Admission medications   Medication Sig Start Date End Date Taking? Authorizing Provider  acetaminophen (TYLENOL) 325 MG tablet Take 2 tablets (650 mg total) by mouth every 6 (six) hours as needed. 02/17/17   Zadie Rhine, MD  clindamycin (CLEOCIN) 300 MG capsule Take 1 capsule (300 mg total) by mouth 3 (three) times daily. 03/03/17   Jacalyn Lefevre, MD  darunavir-cobicistat (PREZCOBIX) 800-150 MG tablet Take 1 tablet by mouth daily with breakfast. Swallow whole. Do NOT crush, break or chew tablets. Take with food. 09/27/16   Randall Hiss, MD  dicyclomine (BENTYL) 20  MG tablet Take 1 tablet (20 mg total) by mouth 2 (two) times daily. 12/06/16   Maia PlanJoshua G Long, MD  dolutegravir (TIVICAY) 50 MG tablet Take 1 tablet (50 mg total) by mouth daily. 09/27/16   Randall Hissornelius N Van Dam, MD  emtricitabine-tenofovir AF (DESCOVY) 200-25 MG tablet Take 1 tablet by mouth daily. 09/27/16   Randall Hissornelius N Van Dam, MD  feeding supplement (BOOST / RESOURCE BREEZE) LIQD Take 1 Container by mouth 3 (three) times daily between meals. 09/21/16   Catarina Hartshornavid Tat, MD  fluconazole (DIFLUCAN) 200 MG tablet Take 2 tablets (400 mg total) by mouth daily. 09/21/16   Catarina Hartshornavid Tat, MD  lidocaine (XYLOCAINE) 5 % ointment Apply 1 application topically as needed. 03/03/17   Jacalyn LefevreJulie  Iran Kievit, MD  Nitroglycerin 0.4 % OINT Place 1 inch rectally 2 (two) times daily. 03/03/17   Jacalyn LefevreJulie Eiman Maret, MD  ondansetron (ZOFRAN) 4 MG tablet Take 1 tablet (4 mg total) by mouth every 6 (six) hours. 12/06/16   Maia PlanJoshua G Long, MD  sulfamethoxazole-trimethoprim (BACTRIM DS,SEPTRA DS) 800-160 MG tablet Take 1 tablet by mouth 3 (three) times a week. 09/22/16   Catarina Hartshornavid Tat, MD    Family History Family History  Problem Relation Age of Onset  . Hypertension Other     Social History Social History  Substance Use Topics  . Smoking status: Former Smoker    Packs/day: 0.25    Years: 5.00    Types: Cigarettes    Quit date: 08/12/2016  . Smokeless tobacco: Never Used  . Alcohol use 0.0 oz/week     Comment: occasionally      Allergies   Patient has no known allergies.   Review of Systems Review of Systems  Gastrointestinal: Positive for blood in stool and rectal pain.  All other systems reviewed and are negative.    Physical Exam Updated Vital Signs BP 116/79   Pulse 80   Temp 98.7 F (37.1 C) (Oral)   Resp 16   SpO2 97%   Physical Exam  Constitutional: He is oriented to person, place, and time. He appears well-developed and well-nourished.  HENT:  Head: Normocephalic and atraumatic.  Right Ear: External ear normal.  Left Ear: External ear normal.  Nose: Nose normal.  Mouth/Throat: Oropharynx is clear and moist.  Eyes: Conjunctivae and EOM are normal. Pupils are equal, round, and reactive to light.  Neck: Normal range of motion. Neck supple.  Cardiovascular: Normal rate, regular rhythm, normal heart sounds and intact distal pulses.   Pulmonary/Chest: Effort normal and breath sounds normal.  Abdominal: Soft. Bowel sounds are normal.  Genitourinary: Rectal exam shows fissure, tenderness and guaiac positive stool.  Genitourinary Comments: Ulcerated and macerated rectum  Musculoskeletal: Normal range of motion.  Neurological: He is alert and oriented to person, place, and  time.  Skin: Skin is warm.  Psychiatric: He has a normal mood and affect. His behavior is normal. Judgment and thought content normal.  Nursing note and vitals reviewed.    ED Treatments / Results  Labs (all labs ordered are listed, but only abnormal results are displayed) Labs Reviewed  CBC WITH DIFFERENTIAL/PLATELET - Abnormal; Notable for the following:       Result Value   WBC 3.0 (*)    RBC 4.13 (*)    Hemoglobin 12.9 (*)    HCT 37.9 (*)    Neutro Abs 1.6 (*)    Lymphs Abs 0.6 (*)    All other components within normal limits  BASIC METABOLIC PANEL - Abnormal;  Notable for the following:    Potassium 3.3 (*)    Calcium 8.5 (*)    All other components within normal limits  POC OCCULT BLOOD, ED - Abnormal; Notable for the following:    Fecal Occult Bld POSITIVE (*)    All other components within normal limits  I-STAT CG4 LACTIC ACID, ED  I-STAT CG4 LACTIC ACID, ED    EKG  EKG Interpretation None       Radiology Ct Abdomen Pelvis W Contrast  Result Date: 03/03/2017 CLINICAL DATA:  Rectal pain. Feels like a lump with bloody discharge. EXAM: CT ABDOMEN AND PELVIS WITH CONTRAST TECHNIQUE: Multidetector CT imaging of the abdomen and pelvis was performed using the standard protocol following bolus administration of intravenous contrast. CONTRAST:  ISOVUE-300 IOPAMIDOL (ISOVUE-300) INJECTION 61% COMPARISON:  None. FINDINGS: Lower chest: No acute abnormality. Hepatobiliary: No focal liver abnormality is seen. No gallstones, gallbladder wall thickening, or biliary dilatation. Pancreas: Unremarkable. No pancreatic ductal dilatation or surrounding inflammatory changes. Spleen: Normal in size without focal abnormality. Adrenals/Urinary Tract: Adrenal glands are unremarkable. Kidneys are normal, without renal calculi, focal lesion, or hydronephrosis. Bladder is unremarkable. Stomach/Bowel: Stomach is within normal limits. Appendix appears normal. No evidence of bowel wall  thickening, distention, or inflammatory changes. Vascular/Lymphatic: No significant vascular findings are present. No enlarged abdominal or pelvic lymph nodes. Reproductive: Prostate is unremarkable. Other: No abdominal wall hernia or abnormality. No abdominopelvic ascites. Musculoskeletal: No acute or significant osseous findings. IMPRESSION: 1. No acute abdominal or pelvic pathology. Electronically Signed   By: Elige Ko   On: 03/03/2017 09:35    Procedures Procedures (including critical care time)  Medications Ordered in ED Medications  HYDROcodone-acetaminophen (NORCO/VICODIN) 5-325 MG per tablet 1 tablet (not administered)  morphine 4 MG/ML injection 4 mg (4 mg Intravenous Given 03/03/17 0807)  ondansetron (ZOFRAN) injection 4 mg (4 mg Intravenous Given 03/03/17 0807)  clindamycin (CLEOCIN) IVPB 600 mg (0 mg Intravenous Stopped 03/03/17 0837)  iopamidol (ISOVUE-300) 61 % injection (100 mLs  Contrast Given 03/03/17 0856)     Initial Impression / Assessment and Plan / ED Course  I have reviewed the triage vital signs and the nursing notes.  Pertinent labs & imaging results that were available during my care of the patient were reviewed by me and considered in my medical decision making (see chart for details).     No abscess for surgery to drain.  I looked back at old charts and most of this seems to be chronic.  The pt is encouraged to f/u with ID to get back on his ARV meds.  He is given the number to surgery to f/u if fissure is not healing.  He knows to return if worse.  Final Clinical Impressions(s) / ED Diagnoses   Final diagnoses:  Anal fissure  HIV (human immunodeficiency virus infection) (HCC)  Noncompliance with antiretroviral medication regimen    New Prescriptions New Prescriptions   CLINDAMYCIN (CLEOCIN) 300 MG CAPSULE    Take 1 capsule (300 mg total) by mouth 3 (three) times daily.   LIDOCAINE (XYLOCAINE) 5 % OINTMENT    Apply 1 application topically as needed.    NITROGLYCERIN 0.4 % OINT    Place 1 inch rectally 2 (two) times daily.     Jacalyn Lefevre, MD 03/03/17 [phone]

## 2017-03-03 NOTE — ED Triage Notes (Signed)
Patient thinks he may have another abscess in his rectum again.  Patient states that he felt a lump in his rectum.  Patient on antibiotics for the last time he was here, was told that the antibiotics would clear up the peridentitis and the rectal pain.

## 2017-03-03 NOTE — ED Notes (Signed)
The pt is c/o having a rectal abscess he has had numerus ones in the past and he has been to the or for drainage  No pain just pressure and he saw a little bit of blood with his bms.

## 2017-03-05 ENCOUNTER — Inpatient Hospital Stay (HOSPITAL_COMMUNITY)
Admission: EM | Admit: 2017-03-05 | Discharge: 2017-03-07 | DRG: 393 | Disposition: A | Discharge status: Home / Self Care | Payer: No Typology Code available for payment source | Attending: Internal Medicine | Admitting: Internal Medicine

## 2017-03-05 ENCOUNTER — Encounter (HOSPITAL_COMMUNITY): Payer: Self-pay | Admitting: Emergency Medicine

## 2017-03-05 ENCOUNTER — Observation Stay (HOSPITAL_COMMUNITY): Payer: No Typology Code available for payment source

## 2017-03-05 ENCOUNTER — Emergency Department (HOSPITAL_COMMUNITY): Payer: No Typology Code available for payment source

## 2017-03-05 DIAGNOSIS — Z91199 Patient's noncompliance with other medical treatment and regimen due to unspecified reason: Secondary | ICD-10-CM

## 2017-03-05 DIAGNOSIS — E872 Acidosis, unspecified: Secondary | ICD-10-CM | POA: Diagnosis present

## 2017-03-05 DIAGNOSIS — K6289 Other specified diseases of anus and rectum: Secondary | ICD-10-CM

## 2017-03-05 DIAGNOSIS — Z23 Encounter for immunization: Secondary | ICD-10-CM

## 2017-03-05 DIAGNOSIS — D649 Anemia, unspecified: Secondary | ICD-10-CM | POA: Diagnosis present

## 2017-03-05 DIAGNOSIS — F419 Anxiety disorder, unspecified: Secondary | ICD-10-CM | POA: Diagnosis present

## 2017-03-05 DIAGNOSIS — K602 Anal fissure, unspecified: Secondary | ICD-10-CM | POA: Diagnosis present

## 2017-03-05 DIAGNOSIS — Z87891 Personal history of nicotine dependence: Secondary | ICD-10-CM

## 2017-03-05 DIAGNOSIS — K611 Rectal abscess: Secondary | ICD-10-CM | POA: Diagnosis not present

## 2017-03-05 DIAGNOSIS — B20 Human immunodeficiency virus [HIV] disease: Secondary | ICD-10-CM

## 2017-03-05 DIAGNOSIS — D72819 Decreased white blood cell count, unspecified: Secondary | ICD-10-CM | POA: Diagnosis not present

## 2017-03-05 DIAGNOSIS — F319 Bipolar disorder, unspecified: Secondary | ICD-10-CM | POA: Diagnosis present

## 2017-03-05 DIAGNOSIS — K604 Rectal fistula, unspecified: Secondary | ICD-10-CM | POA: Diagnosis present

## 2017-03-05 DIAGNOSIS — K603 Anal fistula, unspecified: Secondary | ICD-10-CM

## 2017-03-05 DIAGNOSIS — E876 Hypokalemia: Secondary | ICD-10-CM | POA: Diagnosis present

## 2017-03-05 DIAGNOSIS — Z599 Problem related to housing and economic circumstances, unspecified: Secondary | ICD-10-CM

## 2017-03-05 DIAGNOSIS — Z9119 Patient's noncompliance with other medical treatment and regimen: Secondary | ICD-10-CM | POA: Diagnosis not present

## 2017-03-05 DIAGNOSIS — R059 Cough, unspecified: Secondary | ICD-10-CM

## 2017-03-05 DIAGNOSIS — Z7951 Long term (current) use of inhaled steroids: Secondary | ICD-10-CM

## 2017-03-05 DIAGNOSIS — A630 Anogenital (venereal) warts: Secondary | ICD-10-CM | POA: Diagnosis present

## 2017-03-05 DIAGNOSIS — R05 Cough: Secondary | ICD-10-CM

## 2017-03-05 LAB — CBC WITH DIFFERENTIAL/PLATELET
BASOS PCT: 2 %
Basophils Absolute: 0.1 10*3/uL (ref 0.0–0.1)
EOS PCT: 8 %
Eosinophils Absolute: 0.2 10*3/uL (ref 0.0–0.7)
HEMATOCRIT: 36.3 % — AB (ref 39.0–52.0)
HEMOGLOBIN: 12.7 g/dL — AB (ref 13.0–17.0)
LYMPHS ABS: 0.7 10*3/uL (ref 0.7–4.0)
Lymphocytes Relative: 29 %
MCH: 32 pg (ref 26.0–34.0)
MCHC: 35 g/dL (ref 30.0–36.0)
MCV: 91.4 fL (ref 78.0–100.0)
MONOS PCT: 19 %
Monocytes Absolute: 0.5 10*3/uL (ref 0.1–1.0)
NEUTROS ABS: 1 10*3/uL — AB (ref 1.7–7.7)
Neutrophils Relative %: 42 %
Platelets: 170 10*3/uL (ref 150–400)
RBC: 3.97 MIL/uL — ABNORMAL LOW (ref 4.22–5.81)
RDW: 14 % (ref 11.5–15.5)
WBC: 2.5 10*3/uL — ABNORMAL LOW (ref 4.0–10.5)

## 2017-03-05 LAB — BASIC METABOLIC PANEL
ANION GAP: 9 (ref 5–15)
BUN: 7 mg/dL (ref 6–20)
CO2: 24 mmol/L (ref 22–32)
Calcium: 8.4 mg/dL — ABNORMAL LOW (ref 8.9–10.3)
Chloride: 106 mmol/L (ref 101–111)
Creatinine, Ser: 0.71 mg/dL (ref 0.61–1.24)
GLUCOSE: 72 mg/dL (ref 65–99)
POTASSIUM: 3.5 mmol/L (ref 3.5–5.1)
Sodium: 139 mmol/L (ref 135–145)

## 2017-03-05 LAB — I-STAT CG4 LACTIC ACID, ED
Lactic Acid, Venous: 2.31 mmol/L (ref 0.5–1.9)
Lactic Acid, Venous: 1.4 mmol/L (ref 0.5–1.9)

## 2017-03-05 MED ORDER — METRONIDAZOLE IN NACL 5-0.79 MG/ML-% IV SOLN
500.0000 mg | Freq: Three times a day (TID) | INTRAVENOUS | Status: DC
  Administered 2017-03-06 (×2): 500 mg via INTRAVENOUS
  Filled 2017-03-05 (×2): qty 100

## 2017-03-05 MED ORDER — DARUNAVIR-COBICISTAT 800-150 MG PO TABS
1.0000 | ORAL_TABLET | Freq: Every day | ORAL | Status: DC
  Administered 2017-03-06: 1 via ORAL
  Filled 2017-03-05: qty 1

## 2017-03-05 MED ORDER — HYDROMORPHONE HCL 2 MG/ML IJ SOLN
1.0000 mg | INTRAMUSCULAR | Status: DC | PRN
  Administered 2017-03-05 – 2017-03-07 (×9): 1 mg via INTRAVENOUS
  Filled 2017-03-05 (×9): qty 1

## 2017-03-05 MED ORDER — NITROGLYCERIN 0.4 % RE OINT: 1.0000 [in_us] | TOPICAL_OINTMENT | Freq: Two times a day (BID) | RECTAL | Status: DC

## 2017-03-05 MED ORDER — ONDANSETRON HCL 4 MG PO TABS: 4.0000 mg | ORAL_TABLET | Freq: Four times a day (QID) | ORAL | Status: DC | PRN

## 2017-03-05 MED ORDER — CIPROFLOXACIN IN D5W 400 MG/200ML IV SOLN
400.0000 mg | Freq: Two times a day (BID) | INTRAVENOUS | Status: DC
  Administered 2017-03-06: 400 mg via INTRAVENOUS
  Filled 2017-03-05: qty 200

## 2017-03-05 MED ORDER — GUAIFENESIN ER 600 MG PO TB12
600.0000 mg | ORAL_TABLET | Freq: Two times a day (BID) | ORAL | Status: DC
  Administered 2017-03-05 – 2017-03-07 (×4): 600 mg via ORAL
  Filled 2017-03-05 (×4): qty 1

## 2017-03-05 MED ORDER — METRONIDAZOLE IN NACL 5-0.79 MG/ML-% IV SOLN
500.0000 mg | Freq: Once | INTRAVENOUS | Status: AC
  Administered 2017-03-05: 500 mg via INTRAVENOUS
  Filled 2017-03-05: qty 100

## 2017-03-05 MED ORDER — DOLUTEGRAVIR SODIUM 50 MG PO TABS
50.0000 mg | ORAL_TABLET | Freq: Every day | ORAL | Status: DC
  Administered 2017-03-06: 50 mg via ORAL
  Filled 2017-03-05: qty 1

## 2017-03-05 MED ORDER — HYDROMORPHONE HCL 2 MG/ML IJ SOLN
1.0000 mg | Freq: Once | INTRAMUSCULAR | Status: AC
  Administered 2017-03-05: 1 mg via INTRAVENOUS
  Filled 2017-03-05: qty 1

## 2017-03-05 MED ORDER — IOPAMIDOL (ISOVUE-300) INJECTION 61%
INTRAVENOUS | Status: AC
  Administered 2017-03-05: 100 mL
  Filled 2017-03-05: qty 100

## 2017-03-05 MED ORDER — LIDOCAINE 5 % EX OINT: 1.0000 "application " | TOPICAL_OINTMENT | CUTANEOUS | Status: DC | PRN

## 2017-03-05 MED ORDER — SODIUM CHLORIDE 0.9 % IV BOLUS (SEPSIS)
1000.0000 mL | Freq: Once | INTRAVENOUS | Status: AC
  Administered 2017-03-05: 1000 mL via INTRAVENOUS

## 2017-03-05 MED ORDER — ACETAMINOPHEN 650 MG RE SUPP: 650.0000 mg | Freq: Four times a day (QID) | RECTAL | Status: DC | PRN

## 2017-03-05 MED ORDER — OXYCODONE HCL 5 MG PO TABS
5.0000 mg | ORAL_TABLET | ORAL | Status: DC | PRN
  Administered 2017-03-06 (×4): 5 mg via ORAL
  Filled 2017-03-05 (×4): qty 1

## 2017-03-05 MED ORDER — ACETAMINOPHEN 325 MG PO TABS: 650.0000 mg | ORAL_TABLET | Freq: Four times a day (QID) | ORAL | Status: DC | PRN

## 2017-03-05 MED ORDER — LIDOCAINE HCL 2 % EX GEL
1.0000 "application " | Freq: Once | CUTANEOUS | Status: AC
  Administered 2017-03-05: 1 via TOPICAL
  Filled 2017-03-05: qty 20

## 2017-03-05 MED ORDER — HYDROMORPHONE BOLUS VIA INFUSION: 1.0000 mg | Freq: Once | INTRAVENOUS | Status: DC

## 2017-03-05 MED ORDER — INFLUENZA VAC SPLIT QUAD 0.5 ML IM SUSY
0.5000 mL | PREFILLED_SYRINGE | INTRAMUSCULAR | Status: AC
  Administered 2017-03-07: 0.5 mL via INTRAMUSCULAR
  Filled 2017-03-05 (×2): qty 0.5

## 2017-03-05 MED ORDER — MORPHINE SULFATE (PF) 4 MG/ML IV SOLN
4.0000 mg | Freq: Once | INTRAVENOUS | Status: AC
  Administered 2017-03-05: 4 mg via INTRAVENOUS
  Filled 2017-03-05: qty 1

## 2017-03-05 MED ORDER — CIPROFLOXACIN IN D5W 400 MG/200ML IV SOLN
400.0000 mg | Freq: Once | INTRAVENOUS | Status: AC
  Administered 2017-03-05: 400 mg via INTRAVENOUS
  Filled 2017-03-05: qty 200

## 2017-03-05 MED ORDER — ONDANSETRON HCL 4 MG/2ML IJ SOLN: 4.0000 mg | Freq: Four times a day (QID) | INTRAMUSCULAR | Status: DC | PRN

## 2017-03-05 MED ORDER — OXYCODONE-ACETAMINOPHEN 5-325 MG PO TABS
2.0000 | ORAL_TABLET | Freq: Once | ORAL | Status: AC
  Administered 2017-03-05: 2 via ORAL
  Filled 2017-03-05: qty 2

## 2017-03-05 NOTE — ED Triage Notes (Signed)
Pt st's he was here 2 days ago for rectal pain and bleeding.  Pt returns today due to increased pain and drainage from rectum.

## 2017-03-05 NOTE — ED Notes (Signed)
Unable to obtain 2nd set of blood cx  Antibiotics started

## 2017-03-05 NOTE — H&P (Signed)
History and Physical    Justin Ball:[id number] DOB: Apr 26, 1991 DOA: 03/05/2017  Referring MD/NP/PA: Dr. Erma Heritage PCP: Acey Lav, MD  Patient coming from: home  Chief Complaint: Rectal pain  HPI: Justin Ball is a 26 y.o. male with medical history significant of HIV/AIDS, bipolar disorder, history of prior anal intraepithelial neoplasia III, noncompliance, and recurrent rectal abscesses/discharge; who presents with complaints of rectal pain. He notes that symptoms have been ongoing over the last 2-3 weeks. Patient reports severe rectal pain that is worsened with any movement or coughing. Just seen in the ED 2 days ago thought to have anal fissure and discharged home with clindamycin, nitroglycerin, and lidocaine. Patient reports being able to obtain the clindamycin and took it as advised.  The other prescriptions for the nitroglycerin and lidocaine he was unable to obtain due to cost. Over last day or so he reports increased bloody drainage for which he woke up with his sheets covered. Associated symptoms include cough x 3 weeks. Denies any chest pain, fever, diarrhea, abdominal pain, nausea, vomiting. Patient sees Dr. Algis Liming of ID as outpatient. However, reports that he does not always make his office visits and does not consistently take his HAART medications regularly. Patient notes that he drinks approximately 3 beers per week and smokes about 3 cigarettes per day.  ED Course: Upon admission to the emergency department patient was seen to be afebrile with vital signs relatively within normal limits. Lab work revealed WBC 2.5, hemoglobin 12.7, lactic acid 2.3. CT scan showing likely rectal fistula with perirectal abscesses and appear to be decreasing in size from previous CT 2 days ago. Patient was started on IV antibiotics of metronidazole and ciprofloxacin. General surgery was called by the ED physician, but recommend no need for surgical intervention at this time and continue  antibiotics.   Review of Systems: As per HPI otherwise 10 point review of systems negative.   Past Medical History:  Diagnosis Date  . Anxiety   . Bipolar 1 disorder (HCC)   . Depression   . HIV infection (HCC)   . Neuromuscular disorder (HCC)   . Scabies   . 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 Substance abuse   . Syphilis     Past Surgical History:  Procedure Laterality Date  . EXAMINATION UNDER ANESTHESIA N/A 12/28/2014   Procedure: EXAM UNDER ANESTHESIA;  Surgeon: Karie Soda, MD;  Location: [facility];  Service: General;  Laterality: N/A;  . FLOOR OF MOUTH BIOPSY N/A 09/16/2016   Procedure: BIOPSY OF ORAL ABCESS;  Surgeon: Newman Pies, MD;  Location: [facility] OR;  Service: ENT;  Laterality: N/A;  . INCISION AND DRAINAGE ABSCESS N/A 09/16/2016   Procedure: INCISION AND DRAINAGE ABSCESS;  Surgeon: Newman Pies, MD;  Location: [facility];  Service: ENT;  Laterality: N/A;  . INCISION AND DRAINAGE PERIRECTAL ABSCESS  08/27/2010   Dr Carolynne Edouard  . INCISION AND DRAINAGE PERIRECTAL ABSCESS N/A 12/28/2014   Procedure: IRRIGATION AND DEBRIDEMENT PERIRECTAL ABSCESS;  Surgeon: Karie Soda, MD;  Location: [facility];  Service: General;  Laterality: N/A;  Fistula repair and ablation     reports that he quit smoking about 6 months ago. His smoking use included Cigarettes. He has a 1.25 pack-year smoking history. He has never used smokeless tobacco. He reports that he drinks alcohol. He reports that he does not use drugs.  No Known Allergies  Family History  Problem Relation Age of Onset  . Hypertension Other     Prior to Admission  medications   Medication Sig Start Date End Date Taking? Authorizing Provider  clindamycin (CLEOCIN) 300 MG capsule Take 1 capsule (300 mg total) by mouth 3 (three) times daily. 03/03/17  Yes Jacalyn Lefevre, MD  darunavir-cobicistat (PREZCOBIX) 800-150 MG tablet Take 1 tablet by mouth daily with breakfast. Swallow whole. Do NOT crush, break or chew tablets. Take with food. 09/27/16  Yes Randall Hiss, MD  dolutegravir (TIVICAY) 50 MG tablet Take 1 tablet (50 mg total) by mouth daily. 09/27/16  Yes Randall Hiss, MD  naproxen sodium (ANAPROX) 220 MG tablet Take 220-440 mg by mouth 2 (two) times daily as needed (for pain).   Yes Historical Provider, MD  acetaminophen (TYLENOL) 325 MG tablet Take 2 tablets (650 mg total) by mouth every 6 (six) hours as needed. Patient not taking: Reported on 03/05/2017 02/17/17   Zadie Rhine, MD  dicyclomine (BENTYL) 20 MG tablet Take 1 tablet (20 mg total) by mouth 2 (two) times daily. Patient not taking: Reported on 03/05/2017 12/06/16   Maia Plan, MD  emtricitabine-tenofovir AF (DESCOVY) 200-25 MG tablet Take 1 tablet by mouth daily. Patient not taking: Reported on 03/05/2017 09/27/16   Randall Hiss, MD  feeding supplement (BOOST / RESOURCE BREEZE) LIQD Take 1 Container by mouth 3 (three) times daily between meals. Patient not taking: Reported on 03/05/2017 09/21/16   Catarina Hartshorn, MD  fluconazole (DIFLUCAN) 200 MG tablet Take 2 tablets (400 mg total) by mouth daily. Patient not taking: Reported on 03/05/2017 09/21/16   Catarina Hartshorn, MD  lidocaine (XYLOCAINE) 5 % ointment Apply 1 application topically as needed. Patient not taking: Reported on 03/05/2017 03/03/17   Jacalyn Lefevre, MD  Nitroglycerin 0.4 % OINT Place 1 inch rectally 2 (two) times daily. Patient not taking: Reported on 03/05/2017 03/03/17   Jacalyn Lefevre, MD  ondansetron (ZOFRAN) 4 MG tablet Take 1 tablet (4 mg total) by mouth every 6 (six) hours. Patient not taking: Reported on 03/05/2017 12/06/16   Maia Plan, MD  sulfamethoxazole-trimethoprim (BACTRIM DS,SEPTRA DS) 800-160 MG tablet Take 1 tablet by mouth 3 (three) times a week. Patient not taking: Reported on 03/05/2017 09/22/16   Catarina Hartshorn, MD    Physical Exam: Constitutional: NAD, calm, comfortable Vitals:   03/05/17 1707  BP: 131/94  Pulse: 90  Resp: 21  Temp: 98.2 F (36.8 C)  TempSrc: Oral  SpO2: 100%   Eyes:  PERRL, lids and conjunctivae normal ENMT: Mucous membranes are moist. Posterior pharynx clear of any exudate or lesions.Normal dentition.  Neck: normal, supple, no masses, no thyromegaly Respiratory: clear to auscultation bilaterally, no wheezing, no crackles. Normal respiratory effort. No accessory muscle use.  Cardiovascular: Regular rate and rhythm, no murmurs / rubs / gallops. No extremity edema. 2+ pedal pulses. No carotid bruits.  Abdomen: no tenderness, no masses palpated. No hepatosplenomegaly. Bowel sounds positive.  Musculoskeletal: no clubbing / cyanosis. No joint deformity upper and lower extremities. Good ROM, no contractures. Normal muscle tone.  Skin: no rashes, lesions, ulcers. No induration Neurologic: CN 2-12 grossly intact. Sensation intact, DTR normal. Strength 5/5 in all 4.  Psychiatric: Normal judgment and insight. Alert and oriented x 3. Normal mood.     Labs on Admission: I have personally reviewed following labs and imaging studies  CBC:  Recent Labs Lab 03/03/17 0801 03/05/17 1824  WBC 3.0* 2.5*  NEUTROABS 1.6* 1.0*  HGB 12.9* 12.7*  HCT 37.9* 36.3*  MCV 91.8 91.4  PLT 154 170  Basic Metabolic Panel:  Recent Labs Lab 03/03/17 0801 03/05/17 1824  NA 141 139  K 3.3* 3.5  CL 106 106  CO2 24 24  GLUCOSE 69 72  BUN 8 7  CREATININE 0.71 0.71  CALCIUM 8.5* 8.4*   GFR: CrCl cannot be calculated (Unknown ideal weight.). Liver Function Tests: No results for input(s): AST, ALT, ALKPHOS, BILITOT, PROT, ALBUMIN in the last 168 hours. No results for input(s): LIPASE, AMYLASE in the last 168 hours. No results for input(s): AMMONIA in the last 168 hours. Coagulation Profile: No results for input(s): INR, PROTIME in the last 168 hours. Cardiac Enzymes: No results for input(s): CKTOTAL, CKMB, CKMBINDEX, TROPONINI in the last 168 hours. BNP (last 3 results) No results for input(s): PROBNP in the last 8760 hours. HbA1C: No results for input(s): HGBA1C in  the last 72 hours. CBG: No results for input(s): GLUCAP in the last 168 hours. Lipid Profile: No results for input(s): CHOL, HDL, LDLCALC, TRIG, CHOLHDL, LDLDIRECT in the last 72 hours. Thyroid Function Tests: No results for input(s): TSH, T4TOTAL, FREET4, T3FREE, THYROIDAB in the last 72 hours. Anemia Panel: No results for input(s): VITAMINB12, FOLATE, FERRITIN, TIBC, IRON, RETICCTPCT in the last 72 hours. Urine analysis:    Component Value Date/Time   COLORURINE YELLOW 12/06/2016 0206   APPEARANCEUR CLEAR 12/06/2016 0206   LABSPEC 1.021 12/06/2016 0206   PHURINE 6.0 12/06/2016 0206   GLUCOSEU NEGATIVE 12/06/2016 0206   HGBUR NEGATIVE 12/06/2016 0206   BILIRUBINUR NEGATIVE 12/06/2016 0206   KETONESUR NEGATIVE 12/06/2016 0206   PROTEINUR NEGATIVE 12/06/2016 0206   UROBILINOGEN 4.0 (H) 09/09/2014 1055   NITRITE NEGATIVE 12/06/2016 0206   LEUKOCYTESUR NEGATIVE 12/06/2016 0206   Sepsis Labs: No results found for this or any previous visit (from the past 240 hour(s)).   Radiological Exams on Admission: Ct Pelvis W Contrast  Addendum Date: 03/05/2017   ADDENDUM REPORT: 03/05/2017 20:11 ADDENDUM: A second smaller collection containing small pockets of air noted more inferiorly likely in the soft tissues which appears to communicate to the more superior perirectal collection via a tract (best seen on sagittal series 6, image 76). Findings likely represent a perirectal abscess with fistulous communication to the rectum and skin. Correlation with clinical exam recommended. Overall the size of this tract and collection appear smaller since the prior study. Electronically Signed   By: Elgie Collard M.D.   On: 03/05/2017 20:11   Result Date: 03/05/2017 CLINICAL DATA:  26 year old male complaining of rectal pain. EXAM: CT PELVIS WITH CONTRAST TECHNIQUE: Multidetector CT imaging of the pelvis was performed using the standard protocol following the bolus administration of intravenous contrast.  CONTRAST:  1 ISOVUE-300 IOPAMIDOL (ISOVUE-300) INJECTION 61% COMPARISON:  CT dated 03/03/2017 FINDINGS: Urinary Tract: The visualized ureters and urinary bladder appear unremarkable. Bowel: There is mild thickened appearance of the rectal with multiple small perirectal lymph nodes. A 16 x 7 x 7 mm low attenuating collection containing fluid and small pockets of air along the course of the distal rectum anterior and inferior to the levator ani muscle (series 3, image 44 and sagittal series 6, image 77) is noted which is appears smaller compared to the prior CT an suspicious for residual abscess. This can be better evaluated with rectal contrast. There is no evidence of bowel obstruction. Normal appendix. Vascular/Lymphatic: No pathologically enlarged lymph nodes. No significant vascular abnormality seen. Reproductive: The visualized prostate and seminal vesicles are grossly unremarkable. Other:  None Musculoskeletal: No suspicious bone lesions identified. IMPRESSION: Mild thickened  appearance of the rectal wall with a very small ill-defined perirectal collection suspicious for residual perirectal abscess. There has been interval decrease in the size of this perirectal collection compared to the prior CT. CT with rectal contrast may provide better evaluation if clinically indicated. Electronically Signed: By: Elgie CollardArash  Radparvar M.D. On: 03/05/2017 19:55      Assessment/Plan Rectal fistula with abscess w/rectal pain: Acute. Patient reports worsening rectal pain and bloody discharge despite taking clindamycin. CT scan of abdomen and pelvis reveals decreased size of rectal abscess and fistula.  - Admit to a past surgical - Follow up blood cultures - Continue antibiotics of metronidazole and ciprofloxacin IV, changed to oral once - Consider need of consultation the surgery if symptoms, not improving.   - Oxycodone prn pain  Leukopenia: Acute. WBC 2.5 with left shift. Suspect secondary to above - Repeat CBC in  a.m.  Lactic acidosis: Now Resolved. Initial lactic acid 2.31 on admission, but then normal limits after initial therapies. - Continue to monitor   AIDS: Last CD4 count noted to be 20 on 09/20/2016.Sees Dr. Algis LimingVandam of ID as outpatient. - May want to notify ID in a.m. to discuss  - Continue Prezcobix and Tivicay - Consider need of restarting  Hypokalemia: Acute. Patient's initial potassium 3.3. - Give potassium chloride 40 mEq 1 dose now. - Continue to monitor and replace as needed   History of noncompliance: Patient reports intermittent use of HARRT medications and financial issues. - Counseled patient on need of compliance with medications and decreased risks of infections.  Anemia: Hemoglobin 12.7 on admission which appears similar to previous hemoglobin obtained 2 days ago. - Continue to monitor   H/O Bipolar disorder: Currently not on any medications.  H/O alcohol and tobacco use: Patient only reports drinking 3 beers per week at this time. - Counseled on the need of cessation of tobacco.  DVT prophylaxis: SCD Code Status: full Family Communication: No family present at bedside  Disposition Plan: Likely discharge home once medically stable Consults called: None  Admission status: Observation  Clydie Braunondell A Magdalen Cabana MD [facility] Pager [phone]  If 7PM-7AM, please contact night-coverage [email or web address] Password [email or web address]  03/05/2017, 9:02 PM

## 2017-03-05 NOTE — ED Notes (Signed)
Patient transported to CT 

## 2017-03-05 NOTE — ED Provider Notes (Signed)
[facility]-EMERGENCY DEPT Provider Note   CSN: [id number] Arrival date & time: 03/05/17  1702     History   Chief Complaint Chief Complaint  Patient presents with  . Rectal Pain    HPI Justin Ball is a 26 y.o. male.  HPI   26 year old male with history of bipolar disorder, syphilis, HIV AIDS who presents with rectal pain. Patient has a history of recurrent rectal abscesses and fissures. He was just recently evaluated on 3/10 for rectal pain. He had a negative CT at that time and was discharged with antibiotics and topical treatment. He states he was unable to afford his topical treatment but has been taking the clindamycin. Over the last several days, he does develop progressively worsening, severe, sharp, stabbing rectal pain. The pain is worse with any movement. He has also noticed bleeding and drainage on his sheets every night. He denies any new trauma to the area. Denies any fevers but has had some nausea. No vomiting. No diarrhea. Pain is worse with movement and bowel movements. Denies any blood in his bowel movements.  Past Medical History:  Diagnosis Date  . Anxiety   . Bipolar 1 disorder (HCC)   . Depression   . HIV infection (HCC)   . Neuromuscular disorder (HCC)   . Scabies   . 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 Substance abuse   . Syphilis     Patient Active Problem List   Diagnosis Date Noted  . Ulcer of soft palate 09/12/2016  . Acute hypokalemia 09/12/2016  . Anemia 09/12/2016  . Alcohol use 09/12/2016  . Oral abscess 09/12/2016  . Abscess   . AIDS (HCC) 01/19/2015  . 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 H/O noncompliance with medical treatment - finincial, presenting hazards to health 12/28/2014  . Rectal abscess 12/28/2014  . Intersphincteric abscess 12/28/2014  . Bipolar disorder (HCC) 04/08/2013  . Muscle ache 01/06/2013  . Lower back pain 01/06/2013  . Insomnia 01/06/2013  . HIV (human immunodeficiency virus infection) (HCC)  10/10/2012  . Rash and nonspecific skin eruption 10/10/2012  . Rectal discharge 10/10/2012  . Syphilis 10/10/2012    Past Surgical History:  Procedure Laterality Date  . EXAMINATION UNDER ANESTHESIA N/A 12/28/2014   Procedure: EXAM UNDER ANESTHESIA;  Surgeon: Karie SodaSteven Gross, MD;  Location: [facility];  Service: General;  Laterality: N/A;  . FLOOR OF MOUTH BIOPSY N/A 09/16/2016   Procedure: BIOPSY OF ORAL ABCESS;  Surgeon: Newman PiesSu Teoh, MD;  Location: [facility] OR;  Service: ENT;  Laterality: N/A;  . INCISION AND DRAINAGE ABSCESS N/A 09/16/2016   Procedure: INCISION AND DRAINAGE ABSCESS;  Surgeon: Newman PiesSu Teoh, MD;  Location: [facility] OR;  Service: ENT;  Laterality: N/A;  . INCISION AND DRAINAGE PERIRECTAL ABSCESS  08/27/2010   Dr Carolynne Edouardoth  . INCISION AND DRAINAGE PERIRECTAL ABSCESS N/A 12/28/2014   Procedure: IRRIGATION AND DEBRIDEMENT PERIRECTAL ABSCESS;  Surgeon: Karie SodaSteven Gross, MD;  Location: [facility];  Service: General;  Laterality: N/A;  Fistula repair and ablation       Home Medications    Prior to Admission medications   Medication Sig Start Date End Date Taking? Authorizing Provider  clindamycin (CLEOCIN) 300 MG capsule Take 1 capsule (300 mg total) by mouth 3 (three) times daily. 03/03/17  Yes Jacalyn LefevreJulie Haviland, MD  darunavir-cobicistat (PREZCOBIX) 800-150 MG tablet Take 1 tablet by mouth daily with breakfast. Swallow whole. Do NOT crush, break or chew tablets. Take with food. 09/27/16  Yes Randall Hissornelius N Van Dam, MD  dolutegravir (TIVICAY) 50 MG tablet Take 1 tablet (50 mg total) by mouth daily. 09/27/16  Yes Randall Hiss, MD  naproxen sodium (ANAPROX) 220 MG tablet Take 220-440 mg by mouth 2 (two) times daily as needed (for pain).   Yes Historical Provider, MD  acetaminophen (TYLENOL) 325 MG tablet Take 2 tablets (650 mg total) by mouth every 6 (six) hours as needed. Patient not taking: Reported on 03/05/2017 02/17/17   Zadie Rhine, MD  dicyclomine (BENTYL) 20 MG tablet Take 1 tablet (20 mg total) by mouth 2 (two)  times daily. Patient not taking: Reported on 03/05/2017 12/06/16   Maia Plan, MD  emtricitabine-tenofovir AF (DESCOVY) 200-25 MG tablet Take 1 tablet by mouth daily. Patient not taking: Reported on 03/05/2017 09/27/16   Randall Hiss, MD  feeding supplement (BOOST / RESOURCE BREEZE) LIQD Take 1 Container by mouth 3 (three) times daily between meals. Patient not taking: Reported on 03/05/2017 09/21/16   Catarina Hartshorn, MD  fluconazole (DIFLUCAN) 200 MG tablet Take 2 tablets (400 mg total) by mouth daily. Patient not taking: Reported on 03/05/2017 09/21/16   Catarina Hartshorn, MD  lidocaine (XYLOCAINE) 5 % ointment Apply 1 application topically as needed. Patient not taking: Reported on 03/05/2017 03/03/17   Jacalyn Lefevre, MD  Nitroglycerin 0.4 % OINT Place 1 inch rectally 2 (two) times daily. Patient not taking: Reported on 03/05/2017 03/03/17   Jacalyn Lefevre, MD  ondansetron (ZOFRAN) 4 MG tablet Take 1 tablet (4 mg total) by mouth every 6 (six) hours. Patient not taking: Reported on 03/05/2017 12/06/16   Maia Plan, MD  sulfamethoxazole-trimethoprim (BACTRIM DS,SEPTRA DS) 800-160 MG tablet Take 1 tablet by mouth 3 (three) times a week. Patient not taking: Reported on 03/05/2017 09/22/16   Catarina Hartshorn, MD    Family History Family History  Problem Relation Age of Onset  . Hypertension Other     Social History Social History  Substance Use Topics  . Smoking status: Former Smoker    Packs/day: 0.25    Years: 5.00    Types: Cigarettes    Quit date: 08/12/2016  . Smokeless tobacco: Never Used  . Alcohol use 0.0 oz/week     Comment: occasionally      Allergies   Patient has no known allergies.   Review of Systems Review of Systems  Constitutional: Positive for fatigue. Negative for chills and fever.  HENT: Negative for congestion and rhinorrhea.   Eyes: Negative for visual disturbance.  Respiratory: Negative for cough, shortness of breath and wheezing.   Cardiovascular: Negative for chest  pain and leg swelling.  Gastrointestinal: Positive for nausea and rectal pain. Negative for abdominal pain, diarrhea and vomiting.  Genitourinary: Negative for discharge, dysuria, flank pain and penile pain.  Musculoskeletal: Negative for neck pain and neck stiffness.  Skin: Negative for rash and wound.  Allergic/Immunologic: Negative for immunocompromised state.  Neurological: Negative for syncope, weakness and headaches.  All other systems reviewed and are negative.    Physical Exam Updated Vital Signs BP 131/94 (BP Location: Right Arm)   Pulse 90   Temp 98.2 F (36.8 C) (Oral)   Resp 21   SpO2 100%   Physical Exam  Constitutional: He is oriented to person, place, and time. He appears well-developed and well-nourished. No distress.  HENT:  Head: Normocephalic and atraumatic.  Eyes: Conjunctivae are normal.  Neck: Neck supple.  Cardiovascular: Normal rate, regular rhythm and normal heart sounds.  Exam reveals no friction rub.  No murmur heard. Pulmonary/Chest: Effort normal and breath sounds normal. No respiratory distress. He has no wheezes. He has no rales.  Abdominal: Soft. Bowel sounds are normal. He exhibits no distension. There is no tenderness. There is no rebound and no guarding.  Genitourinary:  Genitourinary Comments: Multiple superficial excoriations throughout the perianal area, with small anal fissure at 1 o'clock. No active bleeding. No palpable or expressible fluctuance in rectum or perirectal area.  Musculoskeletal: He exhibits no edema.  Neurological: He is alert and oriented to person, place, and time. He exhibits normal muscle tone.  Skin: Skin is warm. Capillary refill takes less than 2 seconds.  Psychiatric: He has a normal mood and affect.  Nursing note and vitals reviewed.    ED Treatments / Results  Labs (all labs ordered are listed, but only abnormal results are displayed) Labs Reviewed  CBC WITH DIFFERENTIAL/PLATELET - Abnormal; Notable for the  following:       Result Value   WBC 2.5 (*)    RBC 3.97 (*)    Hemoglobin 12.7 (*)    HCT 36.3 (*)    Neutro Abs 1.0 (*)    All other components within normal limits  BASIC METABOLIC PANEL - Abnormal; Notable for the following:    Calcium 8.4 (*)    All other components within normal limits  I-STAT CG4 LACTIC ACID, ED - Abnormal; Notable for the following:    Lactic Acid, Venous 2.31 (*)    All other components within normal limits  CULTURE, BLOOD (ROUTINE X 2)  CULTURE, BLOOD (ROUTINE X 2)  I-STAT CG4 LACTIC ACID, ED    EKG  EKG Interpretation None       Radiology Ct Pelvis W Contrast  Addendum Date: 03/05/2017   ADDENDUM REPORT: 03/05/2017 20:11 ADDENDUM: A second smaller collection containing small pockets of air noted more inferiorly likely in the soft tissues which appears to communicate to the more superior perirectal collection via a tract (best seen on sagittal series 6, image 76). Findings likely represent a perirectal abscess with fistulous communication to the rectum and skin. Correlation with clinical exam recommended. Overall the size of this tract and collection appear smaller since the prior study. Electronically Signed   By: Elgie Collard M.D.   On: 03/05/2017 20:11   Result Date: 03/05/2017 CLINICAL DATA:  26 year old male complaining of rectal pain. EXAM: CT PELVIS WITH CONTRAST TECHNIQUE: Multidetector CT imaging of the pelvis was performed using the standard protocol following the bolus administration of intravenous contrast. CONTRAST:  1 ISOVUE-300 IOPAMIDOL (ISOVUE-300) INJECTION 61% COMPARISON:  CT dated 03/03/2017 FINDINGS: Urinary Tract: The visualized ureters and urinary bladder appear unremarkable. Bowel: There is mild thickened appearance of the rectal with multiple small perirectal lymph nodes. A 16 x 7 x 7 mm low attenuating collection containing fluid and small pockets of air along the course of the distal rectum anterior and inferior to the levator  ani muscle (series 3, image 44 and sagittal series 6, image 77) is noted which is appears smaller compared to the prior CT an suspicious for residual abscess. This can be better evaluated with rectal contrast. There is no evidence of bowel obstruction. Normal appendix. Vascular/Lymphatic: No pathologically enlarged lymph nodes. No significant vascular abnormality seen. Reproductive: The visualized prostate and seminal vesicles are grossly unremarkable. Other:  None Musculoskeletal: No suspicious bone lesions identified. IMPRESSION: Mild thickened appearance of the rectal wall with a very small ill-defined perirectal collection suspicious for residual perirectal abscess. There has been interval  decrease in the size of this perirectal collection compared to the prior CT. CT with rectal contrast may provide better evaluation if clinically indicated. Electronically Signed: By: Elgie Collard M.D. On: 03/05/2017 19:55    Procedures Procedures (including critical care time)  Medications Ordered in ED Medications  ciprofloxacin (CIPRO) IVPB 400 mg (not administered)  metroNIDAZOLE (FLAGYL) IVPB 500 mg (not administered)  morphine 4 MG/ML injection 4 mg (not administered)  lidocaine (XYLOCAINE) 2 % jelly 1 application (1 application Topical Given 03/05/17 1848)  oxyCODONE-acetaminophen (PERCOCET/ROXICET) 5-325 MG per tablet 2 tablet (2 tablets Oral Given 03/05/17 1817)  sodium chloride 0.9 % bolus 1,000 mL (1,000 mLs Intravenous New Bag/Given 03/05/17 1850)  iopamidol (ISOVUE-300) 61 % injection (100 mLs  Contrast Given 03/05/17 1925)     Initial Impression / Assessment and Plan / ED Course  I have reviewed the triage vital signs and the nursing notes.  Pertinent labs & imaging results that were available during my care of the patient were reviewed by me and considered in my medical decision making (see chart for details).    26 yo M with PMHx of HIV-AIDS here with ongoing rectal pain. Just diagnosed  with rectal fissures 2 days ago. On arrival, VSS. Exam as above, c/w diffuse excoriations and ulcerations. DDx includes anal fissures, apthous ulcers (consider CMV, viral given h/o HIV-AIDS), skin abrasions 2/2 intercourse with poor wound healing. Will check labs, repeat CT as pt also has drainage, h/o complex perirectal abscesses.  CT scan c/f residual small abscess versus fistula, with surrounding inflammation per my discussion with Radiology. Given worsening pain, drainage, and lactic acidosis with left shif ton CBC despite clinda, will start on IV Cipro/Flagyl, admit for IV ABX. D/w Dr. Janee Morn of surgery who is in agreement, reviewed images and recommends conservative management with ABX at this time.  Final Clinical Impressions(s) / ED Diagnoses   Final diagnoses:  Rectal pain  Fistula-in-ano  Perirectal abscess      Shaune Pollack, MD 03/05/17 2321

## 2017-03-05 NOTE — ED Notes (Signed)
Nurse getting 2nd set blood cultures

## 2017-03-05 NOTE — ED Notes (Signed)
aabnormal CG-4 reported to Dr. Penne LashIssacs

## 2017-03-06 ENCOUNTER — Encounter (HOSPITAL_COMMUNITY): Payer: Self-pay

## 2017-03-06 DIAGNOSIS — B20 Human immunodeficiency virus [HIV] disease: Secondary | ICD-10-CM

## 2017-03-06 DIAGNOSIS — Z7252 High risk homosexual behavior: Secondary | ICD-10-CM

## 2017-03-06 DIAGNOSIS — K603 Anal fistula: Secondary | ICD-10-CM | POA: Diagnosis present

## 2017-03-06 DIAGNOSIS — Z599 Problem related to housing and economic circumstances, unspecified: Secondary | ICD-10-CM | POA: Diagnosis not present

## 2017-03-06 DIAGNOSIS — B181 Chronic viral hepatitis B without delta-agent: Secondary | ICD-10-CM

## 2017-03-06 DIAGNOSIS — Z8249 Family history of ischemic heart disease and other diseases of the circulatory system: Secondary | ICD-10-CM

## 2017-03-06 DIAGNOSIS — Z8619 Personal history of other infectious and parasitic diseases: Secondary | ICD-10-CM

## 2017-03-06 DIAGNOSIS — F319 Bipolar disorder, unspecified: Secondary | ICD-10-CM | POA: Diagnosis present

## 2017-03-06 DIAGNOSIS — K604 Rectal fistula: Secondary | ICD-10-CM

## 2017-03-06 DIAGNOSIS — D649 Anemia, unspecified: Secondary | ICD-10-CM | POA: Diagnosis not present

## 2017-03-06 DIAGNOSIS — K6289 Other specified diseases of anus and rectum: Secondary | ICD-10-CM | POA: Diagnosis present

## 2017-03-06 DIAGNOSIS — F17210 Nicotine dependence, cigarettes, uncomplicated: Secondary | ICD-10-CM | POA: Diagnosis not present

## 2017-03-06 DIAGNOSIS — Z7951 Long term (current) use of inhaled steroids: Secondary | ICD-10-CM | POA: Diagnosis not present

## 2017-03-06 DIAGNOSIS — A630 Anogenital (venereal) warts: Secondary | ICD-10-CM | POA: Diagnosis present

## 2017-03-06 DIAGNOSIS — F419 Anxiety disorder, unspecified: Secondary | ICD-10-CM | POA: Diagnosis present

## 2017-03-06 DIAGNOSIS — Z23 Encounter for immunization: Secondary | ICD-10-CM | POA: Diagnosis not present

## 2017-03-06 DIAGNOSIS — K611 Rectal abscess: Secondary | ICD-10-CM | POA: Diagnosis not present

## 2017-03-06 DIAGNOSIS — L989 Disorder of the skin and subcutaneous tissue, unspecified: Secondary | ICD-10-CM | POA: Diagnosis not present

## 2017-03-06 DIAGNOSIS — E872 Acidosis, unspecified: Secondary | ICD-10-CM | POA: Diagnosis present

## 2017-03-06 DIAGNOSIS — Z9114 Patient's other noncompliance with medication regimen: Secondary | ICD-10-CM

## 2017-03-06 DIAGNOSIS — Z87891 Personal history of nicotine dependence: Secondary | ICD-10-CM | POA: Diagnosis not present

## 2017-03-06 DIAGNOSIS — D72819 Decreased white blood cell count, unspecified: Secondary | ICD-10-CM | POA: Diagnosis not present

## 2017-03-06 DIAGNOSIS — Z9119 Patient's noncompliance with other medical treatment and regimen: Secondary | ICD-10-CM | POA: Diagnosis not present

## 2017-03-06 DIAGNOSIS — K148 Other diseases of tongue: Secondary | ICD-10-CM

## 2017-03-06 DIAGNOSIS — Z8719 Personal history of other diseases of the digestive system: Secondary | ICD-10-CM | POA: Diagnosis not present

## 2017-03-06 DIAGNOSIS — K602 Anal fissure, unspecified: Secondary | ICD-10-CM | POA: Diagnosis present

## 2017-03-06 DIAGNOSIS — E876 Hypokalemia: Secondary | ICD-10-CM | POA: Diagnosis not present

## 2017-03-06 LAB — COMPREHENSIVE METABOLIC PANEL
ALT: 33 U/L (ref 17–63)
ANION GAP: 6 (ref 5–15)
AST: 68 U/L — ABNORMAL HIGH (ref 15–41)
Albumin: 3 g/dL — ABNORMAL LOW (ref 3.5–5.0)
Alkaline Phosphatase: 191 U/L — ABNORMAL HIGH (ref 38–126)
BUN: 5 mg/dL — ABNORMAL LOW (ref 6–20)
CHLORIDE: 106 mmol/L (ref 101–111)
CO2: 25 mmol/L (ref 22–32)
Calcium: 8 mg/dL — ABNORMAL LOW (ref 8.9–10.3)
Creatinine, Ser: 0.67 mg/dL (ref 0.61–1.24)
GFR calc non Af Amer: >60 mL/min (ref >60)
Glucose, Bld: 144 mg/dL — ABNORMAL HIGH (ref 65–99)
Potassium: 3.2 mmol/L — ABNORMAL LOW (ref 3.5–5.1)
SODIUM: 137 mmol/L (ref 135–145)
Total Bilirubin: 0.7 mg/dL (ref 0.3–1.2)
Total Protein: 7.9 g/dL (ref 6.5–8.1)

## 2017-03-06 LAB — CBC
HCT: 36.3 % — ABNORMAL LOW (ref 39.0–52.0)
Hemoglobin: 12.3 g/dL — ABNORMAL LOW (ref 13.0–17.0)
MCH: 31 pg (ref 26.0–34.0)
MCHC: 33.9 g/dL (ref 30.0–36.0)
MCV: 91.4 fL (ref 78.0–100.0)
PLATELETS: 168 10*3/uL (ref 150–400)
RBC: 3.97 MIL/uL — ABNORMAL LOW (ref 4.22–5.81)
RDW: 13.8 % (ref 11.5–15.5)
WBC: 2.3 10*3/uL — ABNORMAL LOW (ref 4.0–10.5)

## 2017-03-06 LAB — RAPID URINE DRUG SCREEN, HOSP PERFORMED
AMPHETAMINES: NOT DETECTED
Barbiturates: NOT DETECTED
Benzodiazepines: NOT DETECTED
COCAINE: NOT DETECTED
OPIATES: POSITIVE — AB
TETRAHYDROCANNABINOL: NOT DETECTED

## 2017-03-06 MED ORDER — NICOTINE 7 MG/24HR TD PT24
7.0000 mg | MEDICATED_PATCH | Freq: Every day | TRANSDERMAL | Status: DC
  Administered 2017-03-06 – 2017-03-07 (×2): 7 mg via TRANSDERMAL
  Filled 2017-03-06 (×2): qty 1

## 2017-03-06 MED ORDER — SODIUM CHLORIDE 0.9 % IV SOLN
3.0000 g | Freq: Four times a day (QID) | INTRAVENOUS | Status: DC
  Administered 2017-03-07 (×3): 3 g via INTRAVENOUS
  Filled 2017-03-06 (×6): qty 3

## 2017-03-06 MED ORDER — SULFAMETHOXAZOLE-TRIMETHOPRIM 400-80 MG PO TABS
1.0000 | ORAL_TABLET | Freq: Every day | ORAL | Status: DC
  Administered 2017-03-06 – 2017-03-07 (×2): 1 via ORAL
  Filled 2017-03-06 (×2): qty 1

## 2017-03-06 MED ORDER — SODIUM CHLORIDE 0.9 % IV SOLN: 2.0000 g | Freq: Three times a day (TID) | INTRAVENOUS | Status: DC

## 2017-03-06 MED ORDER — SULFAMETHOXAZOLE-TRIMETHOPRIM 400-80 MG PO TABS
1.0000 | ORAL_TABLET | Freq: Two times a day (BID) | ORAL | Status: DC
  Filled 2017-03-06: qty 1

## 2017-03-06 MED ORDER — SULFAMETHOXAZOLE-TRIMETHOPRIM 400-80 MG PO TABS: 1.0000 | ORAL_TABLET | Freq: Every day | ORAL | Status: DC

## 2017-03-06 MED ORDER — DIPHENHYDRAMINE HCL 25 MG PO CAPS
25.0000 mg | ORAL_CAPSULE | Freq: Once | ORAL | Status: AC
  Administered 2017-03-06: 25 mg via ORAL
  Filled 2017-03-06: qty 1

## 2017-03-06 MED ORDER — HYDROCORTISONE 1 % EX CREA
TOPICAL_CREAM | Freq: Two times a day (BID) | CUTANEOUS | Status: DC | PRN
Start: 2017-03-06 — End: 2017-03-07
  Administered 2017-03-06: 18:00:00 via TOPICAL
  Filled 2017-03-06: qty 28

## 2017-03-06 MED ORDER — POTASSIUM CHLORIDE CRYS ER 20 MEQ PO TBCR
40.0000 meq | EXTENDED_RELEASE_TABLET | ORAL | Status: AC
  Administered 2017-03-06: 40 meq via ORAL
  Filled 2017-03-06: qty 2

## 2017-03-06 MED ORDER — AMOXICILLIN-POT CLAVULANATE 875-125 MG PO TABS: 1.0000 | ORAL_TABLET | Freq: Two times a day (BID) | ORAL | Status: DC

## 2017-03-06 NOTE — Consult Note (Signed)
[facility] Surgery Consult/Admission Note  Justin Ball 04/25/1991  [id number].    Requesting MD: Dr. Ree Kida Chief Complaint/Reason for Consult: Rectal pain  HPI:   Justin Ball is a 26 y.o. male with history of HIV/AIDS untreated (last CD4 2, 09/12/16), bipolar disorder, history of prior anal intraepithelial neoplasia III, noncompliance, and recurrent rectal abscesses/discharge; who presents with complaints of rectal pain. He reports pain for the last 2-3 weeks. Pain constant, nonradiating, severe, worse with touch or BM. Seen in the ED 3 days ago thought to have anal fissure and discharged home with clindamycin, nitroglycerin, and lidocaine. Patient took the clindamycin for the last 2 days.  The other prescriptions for the nitroglycerin and lidocaine he was unable to obtain due to cost. Over last day or so he reports increased bloody drainage and blood during BM's. Associated nausea. Denies any chest pain, fever, diarrhea, abdominal pain, nausea, vomiting. Patient sees Dr. Drucilla Schmidt of ID as outpatient. Pt states he does not take his HAART medications regularly cause they interfere with this job performance.   ED Course: VSS Labs: WBC 2.5, hemoglobin 12.7, lactic acid 2.3.  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    ROS:  Review of Systems  Constitutional: Positive for chills. Negative for diaphoresis and fever.  HENT: Negative for congestion and sore throat.   Eyes: Negative for pain and discharge.  Respiratory: Negative for wheezing.   Cardiovascular: Negative for chest pain and leg swelling.  Gastrointestinal: Positive for blood in stool, constipation and nausea. Negative for abdominal pain, diarrhea and vomiting.       Rectal pain  Genitourinary: Negative for dysuria and hematuria.  Skin: Positive for itching (chronic) and rash (chronic).  Neurological: Negative for dizziness and loss of  consciousness.  Endo/Heme/Allergies:       Immunocompromised (HIV)     Family History  Problem Relation Age of Onset  . Hypertension Other     Past Medical History:  Diagnosis Date  . Anxiety   . Bipolar 1 disorder ([place])   . Depression   . HIV infection ([place])   . Neuromuscular disorder ([place])   . Scabies   . 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 Substance abuse   . Syphilis     Past Surgical History:  Procedure Laterality Date  . EXAMINATION UNDER ANESTHESIA N/A 12/28/2014   Procedure: EXAM UNDER ANESTHESIA;  Surgeon: Michael Boston, MD;  Location: [facility];  Service: General;  Laterality: N/A;  . FLOOR OF MOUTH BIOPSY N/A 09/16/2016   Procedure: BIOPSY OF ORAL ABCESS;  Surgeon: Leta Baptist, MD;  Location: [place];  Service: ENT;  Laterality: N/A;  . INCISION AND DRAINAGE ABSCESS N/A 09/16/2016   Procedure: INCISION AND DRAINAGE ABSCESS;  Surgeon: Leta Baptist, MD;  Location: [place];  Service: ENT;  Laterality: N/A;  . INCISION AND DRAINAGE PERIRECTAL ABSCESS  08/27/2010   Dr Marlou Starks  . INCISION AND DRAINAGE PERIRECTAL ABSCESS N/A 12/28/2014   Procedure: IRRIGATION AND DEBRIDEMENT PERIRECTAL ABSCESS;  Surgeon: Michael Boston, MD;  [facility];  Service: General;  Laterality: N/A;  Fistula repair and ablation    Social History:  reports that he quit smoking about 6 months ago. His smoking use included Cigarettes. He has a 1.25 pack-year smoking history. He has never used smokeless tobacco. He reports that he drinks alcohol. He reports that he does not use drugs.  Allergies: No Known Allergies  Medications Prior to Admission  Medication Sig Dispense Refill  . clindamycin (CLEOCIN) 300 MG capsule Take 1 capsule (300 mg total) by mouth 3 (three) times daily. 21 capsule 0  . darunavir-cobicistat (PREZCOBIX) 800-150 MG tablet Take 1 tablet by mouth daily with breakfast. Swallow whole. Do NOT crush, break or chew tablets. Take with food. 30 tablet 5  . dolutegravir (TIVICAY) 50 MG tablet Take 1 tablet  (50 mg total) by mouth daily. 30 tablet 5  . naproxen sodium (ANAPROX) 220 MG tablet Take 220-440 mg by mouth 2 (two) times daily as needed (for pain).    Marland Kitchen acetaminophen (TYLENOL) 325 MG tablet Take 2 tablets (650 mg total) by mouth every 6 (six) hours as needed. (Patient not taking: Reported on 03/05/2017) 15 tablet 0  . dicyclomine (BENTYL) 20 MG tablet Take 1 tablet (20 mg total) by mouth 2 (two) times daily. (Patient not taking: Reported on 03/05/2017) 20 tablet 0  . emtricitabine-tenofovir AF (DESCOVY) 200-25 MG tablet Take 1 tablet by mouth daily. (Patient not taking: Reported on 03/05/2017) 30 tablet 5  . feeding supplement (BOOST / RESOURCE BREEZE) LIQD Take 1 Container by mouth 3 (three) times daily between meals. (Patient not taking: Reported on 03/05/2017) 90 Container 0  . fluconazole (DIFLUCAN) 200 MG tablet Take 2 tablets (400 mg total) by mouth daily. (Patient not taking: Reported on 03/05/2017) 54 tablet 0  . lidocaine (XYLOCAINE) 5 % ointment Apply 1 application topically as needed. (Patient not taking: Reported on 03/05/2017) 35.44 g 0  . Nitroglycerin 0.4 % OINT Place 1 inch rectally 2 (two) times daily. (Patient not taking: Reported on 03/05/2017) 30 g 0  . ondansetron (ZOFRAN) 4 MG tablet Take 1 tablet (4 mg total) by mouth every 6 (six) hours. (Patient not taking: Reported on 03/05/2017) 12 tablet 0  . sulfamethoxazole-trimethoprim (BACTRIM DS,SEPTRA DS) 800-160 MG tablet Take 1 tablet by mouth 3 (three) times a week. (Patient not taking: Reported on 03/05/2017) 15 tablet 1    Blood pressure 110/72, pulse 72, temperature 97.9 F (36.6 C), temperature source Oral, resp. rate 18, SpO2 99 %.  Physical Exam  Constitutional: He is oriented to person, place, and time. No distress.  Thin, frail appearing, AA male, in no acute distress  HENT:  Head: Normocephalic and atraumatic.  Eyes: Conjunctivae are normal. Right eye exhibits no discharge. Left eye exhibits no discharge. No scleral  icterus.  Neck: Normal range of motion. Neck supple.  Cardiovascular: Normal rate, regular rhythm, S1 normal, S2 normal and normal heart sounds.  Exam reveals no gallop and no friction rub.   No murmur heard. Pulmonary/Chest: Effort normal and breath sounds normal. He has no decreased breath sounds. He has no wheezes. He has no rhonchi. He has no rales.  Abdominal: Soft. Bowel sounds are normal. He exhibits no distension. There is no tenderness. There is no rebound and no guarding.  Genitourinary: Rectal exam shows fissure and tenderness.  Genitourinary Comments: 2 small ulcer appearing lesions noted just lateral to anus on the right, fissure noted to lateroposterior aspect of anus, DRE revealed no appreciated area of fluctuance, moderate TTP of DRE. No hemorrhoids noted   Musculoskeletal: Normal range of motion. He exhibits no edema, tenderness or deformity.  Neurological: He is alert and oriented to person, place, and time.  Skin: Skin is warm and dry. Rash (scattered what look like scars of possible excoriated area that cover most of body. Pt states he scratches his skin a lot) noted. He is not diaphoretic.  Psychiatric: Mood  and affect normal.  Nursing note and vitals reviewed.   Results for orders placed or performed during the hospital encounter of 03/05/17 (from the past 48 hour(s))  CBC with Differential     Status: Abnormal   Collection Time: 03/05/17  6:24 PM  Result Value Ref Range   WBC 2.5 (L) 4.0 - 10.5 K/uL   RBC 3.97 (L) 4.22 - 5.81 MIL/uL   Hemoglobin 12.7 (L) 13.0 - 17.0 g/dL   HCT 36.3 (L) 39.0 - 52.0 %   MCV 91.4 78.0 - 100.0 fL   MCH 32.0 26.0 - 34.0 pg   MCHC 35.0 30.0 - 36.0 g/dL   RDW 14.0 11.5 - 15.5 %   Platelets 170 150 - 400 K/uL   Neutrophils Relative % 42 %   Lymphocytes Relative 29 %   Monocytes Relative 19 %   Eosinophils Relative 8 %   Basophils Relative 2 %   Neutro Abs 1.0 (L) 1.7 - 7.7 K/uL   Lymphs Abs 0.7 0.7 - 4.0 K/uL   Monocytes Absolute 0.5  0.1 - 1.0 K/uL   Eosinophils Absolute 0.2 0.0 - 0.7 K/uL   Basophils Absolute 0.1 0.0 - 0.1 K/uL   RBC Morphology TARGET CELLS     Comment: ELLIPTOCYTES   WBC Morphology MILD LEFT SHIFT (1-5% METAS, OCC MYELO, OCC BANDS)   Basic metabolic panel     Status: Abnormal   Collection Time: 03/05/17  6:24 PM  Result Value Ref Range   Sodium 139 135 - 145 mmol/L   Potassium 3.5 3.5 - 5.1 mmol/L   Chloride 106 101 - 111 mmol/L   CO2 24 22 - 32 mmol/L   Glucose, Bld 72 65 - 99 mg/dL   BUN 7 6 - 20 mg/dL   Creatinine, Ser 0.71 0.61 - 1.24 mg/dL   Calcium 8.4 (L) 8.9 - 10.3 mg/dL   GFR calc non Af Amer >60 >60 mL/min   GFR calc Af Amer >60 >60 mL/min    Comment: (NOTE) The eGFR has been calculated using the CKD EPI equation. This calculation has not been validated in all clinical situations. eGFR's persistently <60 mL/min signify possible Chronic Kidney Disease.    Anion gap 9 5 - 15  I-Stat CG4 Lactic Acid, ED     Status: Abnormal   Collection Time: 03/05/17  6:34 PM  Result Value Ref Range   Lactic Acid, Venous 2.31 (HH) 0.5 - 1.9 mmol/L   Comment NOTIFIED PHYSICIAN   I-Stat CG4 Lactic Acid, ED     Status: None   Collection Time: 03/05/17  8:49 PM  Result Value Ref Range   Lactic Acid, Venous 1.40 0.5 - 1.9 mmol/L  CBC     Status: Abnormal   Collection Time: 03/06/17 12:55 AM  Result Value Ref Range   WBC 2.3 (L) 4.0 - 10.5 K/uL   RBC 3.97 (L) 4.22 - 5.81 MIL/uL   Hemoglobin 12.3 (L) 13.0 - 17.0 g/dL   HCT 36.3 (L) 39.0 - 52.0 %   MCV 91.4 78.0 - 100.0 fL   MCH 31.0 26.0 - 34.0 pg   MCHC 33.9 30.0 - 36.0 g/dL   RDW 13.8 11.5 - 15.5 %   Platelets 168 150 - 400 K/uL  Comprehensive metabolic panel     Status: Abnormal   Collection Time: 03/06/17 12:55 AM  Result Value Ref Range   Sodium 137 135 - 145 mmol/L   Potassium 3.2 (L) 3.5 - 5.1 mmol/L   Chloride 106 101 -  111 mmol/L   CO2 25 22 - 32 mmol/L   Glucose, Bld 144 (H) 65 - 99 mg/dL   BUN 5 (L) 6 - 20 mg/dL   Creatinine,  Ser 0.67 0.61 - 1.24 mg/dL   Calcium 8.0 (L) 8.9 - 10.3 mg/dL   Total Protein 7.9 6.5 - 8.1 g/dL   Albumin 3.0 (L) 3.5 - 5.0 g/dL   AST 68 (H) 15 - 41 U/L   ALT 33 17 - 63 U/L   Alkaline Phosphatase 191 (H) 38 - 126 U/L   Total Bilirubin 0.7 0.3 - 1.2 mg/dL   GFR calc non Af Amer >60 >60 mL/min   GFR calc Af Amer >60 >60 mL/min    Comment: (NOTE) The eGFR has been calculated using the CKD EPI equation. This calculation has not been validated in all clinical situations. eGFR's persistently <60 mL/min signify possible Chronic Kidney Disease.    Anion gap 6 5 - 15   Ct Pelvis W Contrast  Addendum Date: 03/05/2017   ADDENDUM REPORT: 03/05/2017 20:11 ADDENDUM: A second smaller collection containing small pockets of air noted more inferiorly likely in the soft tissues which appears to communicate to the more superior perirectal collection via a tract (best seen on sagittal series 6, image 76). Findings likely represent a perirectal abscess with fistulous communication to the rectum and skin. Correlation with clinical exam recommended. Overall the size of this tract and collection appear smaller since the prior study. Electronically Signed   By: Anner Crete M.D.   On: 03/05/2017 20:11   Result Date: 03/05/2017 CLINICAL DATA:  26 year old male complaining of rectal pain. EXAM: CT PELVIS WITH CONTRAST TECHNIQUE: Multidetector CT imaging of the pelvis was performed using the standard protocol following the bolus administration of intravenous contrast. CONTRAST:  1 ISOVUE-300 IOPAMIDOL (ISOVUE-300) INJECTION 61% COMPARISON:  CT dated 03/03/2017 FINDINGS: Urinary Tract: The visualized ureters and urinary bladder appear unremarkable. Bowel: There is mild thickened appearance of the rectal with multiple small perirectal lymph nodes. A 16 x 7 x 7 mm low attenuating collection containing fluid and small pockets of air along the course of the distal rectum anterior and inferior to the levator ani muscle  (series 3, image 44 and sagittal series 6, image 77) is noted which is appears smaller compared to the prior CT an suspicious for residual abscess. This can be better evaluated with rectal contrast. There is no evidence of bowel obstruction. Normal appendix. Vascular/Lymphatic: No pathologically enlarged lymph nodes. No significant vascular abnormality seen. Reproductive: The visualized prostate and seminal vesicles are grossly unremarkable. Other:  None Musculoskeletal: No suspicious bone lesions identified. IMPRESSION: Mild thickened appearance of the rectal wall with a very small ill-defined perirectal collection suspicious for residual perirectal abscess. There has been interval decrease in the size of this perirectal collection compared to the prior CT. CT with rectal contrast may provide better evaluation if clinically indicated. Electronically Signed: By: Anner Crete M.D. On: 03/05/2017 19:55   Portable Chest 1 View  Result Date: 03/05/2017 CLINICAL DATA:  26 y/o  M; cough. EXAM: PORTABLE CHEST 1 VIEW COMPARISON:  None. FINDINGS: The heart size and mediastinal contours are within normal limits. Both lungs are clear. The visualized skeletal structures are unremarkable. IMPRESSION: No active disease. Electronically Signed   By: Kristine Garbe M.D.   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 No surgery at this time. Counseled pt on importance of HIV medication compliance.  We will continue to follow the pt. Thank you for the consult.   Kalman Drape, [facility]  Surgery 03/06/2017, 12:52 PM Pager: [phone] Consults: [phone] Mon-Fri 7:00 am-4:30 pm Sat-Sun 7:00 am-11:30 am

## 2017-03-06 NOTE — Consult Note (Signed)
Date of Admission:  03/05/2017  Date of Consult:  03/06/2017  Reason for Consult: HIV Referring Physician: Dr. Ree Kida   HPI: Justin Ball is an 26 y.o. 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 Over the next several weeks, his rectal pain worsened to the point where it affected his ambulation and sleep and was associated with chills and "leaking blood." He presented to the ED again on 03/03/17  Per review of the chart, he was discharged on clindamycin, lidocaine, and nitroglycerin. As he felt no better, he presented yesterday for admission at which time CT pelvis with contrast noted perirectal abscess with fistula to the rectum and skin though findings appear smaller than prior study. He was started on ciprofloxacin and metronidazole, and ID was consulted for further management. He recalls having a rectal abscess 3-4 years ago that was drained without complication thoughhe underwent I&D of intersphinteric rectal abscess fistulotomy and ablation of a perinal condyloma acuminatum  in January 2016; biopsy revealed anal intraepithelial neoplasia grade II/III. He denies poor appetite, nausea, vomiting, constipation, fever over the last few weeks.  He recalls being diagnosed with HIV 3-4 years ago but acknowledges not following up Dr. Tommy Medal and not taking ARV therapy [Tivicay, Truvada, Prezista, [name]] because of drowsiness he experiences at work. Per review of the chart, he had RPR titer 1:64 in 2010 and HBV surface Ag+, total core Ab+ in 2013. He is intimate with 1 male partner and has  not had any recent sexual activity since the onset of symptoms.       Past Medical History:  Diagnosis Date  . Anxiety   . Bipolar 1 disorder ([place])   . Depression   . HIV infection ([place])   . Neuromuscular disorder ([place])   . Scabies   . 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 Substance abuse   . Syphilis     Past Surgical History:  Procedure Laterality Date  . EXAMINATION UNDER ANESTHESIA N/A 12/28/2014   Procedure: EXAM UNDER ANESTHESIA;  Surgeon: Michael Boston, MD;  [facility];  Service: General;  Laterality: N/A;  . FLOOR OF MOUTH BIOPSY N/A 09/16/2016   Procedure: BIOPSY OF ORAL ABCESS;  Surgeon: Leta Baptist, MD;  Location: [place];  Service: ENT;  Laterality: N/A;  . INCISION AND DRAINAGE ABSCESS N/A 09/16/2016   Procedure: INCISION AND DRAINAGE ABSCESS;  Surgeon: Leta Baptist, MD;  Location: [place];  Service: ENT;  Laterality: N/A;  . INCISION AND DRAINAGE PERIRECTAL ABSCESS  08/27/2010   Dr Marlou Starks  . 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 Smokes 3 cigarettes/day x 2 years. Drinks mixed drinks on occasion. No illicit drug use. Sexual history as noted in HPI.  Family History  Problem Relation Age of Onset  . Hypertension Other     No Known Allergies   Medications: I have reviewed patients current medications as documented in  [facility] Anti-infectives    Start     Dose/Rate Route Frequency Ordered Stop   03/06/17 1000  dolutegravir (TIVICAY) tablet 50 mg  Status:  Discontinued     50 mg Oral Daily 03/05/17 2210 03/06/17 1110   03/06/17 0900  ciprofloxacin (CIPRO) IVPB 400 mg     400 mg 200 mL/hr over 60 Minutes Intravenous Every 12 hours 03/05/17 2210     03/06/17 0800  darunavir-cobicistat (PREZCOBIX) 800-150 MG per tablet 1 tablet  Status:  Discontinued      Comments:  Swallow whole. Do NOT crush, break or chew tablets. Take with food.     1 tablet Oral Daily with breakfast 03/05/17 2210 03/06/17 1110   03/06/17 0500  metroNIDAZOLE (FLAGYL) IVPB 500 mg     500 mg 100 mL/hr over 60 Minutes Intravenous Every 8 hours 03/05/17 2210     03/05/17 2015  ciprofloxacin (CIPRO) IVPB 400 mg     400 mg 200 mL/hr over 60 Minutes Intravenous  Once 03/05/17 2007 03/05/17 2200   03/05/17 2015  metroNIDAZOLE (FLAGYL) IVPB 500 mg     500 mg 100 mL/hr over 60 Minutes Intravenous  Once 03/05/17 2007 03/05/17 2202      ROS: As in HPI otherwise remainder of 12 point Review of Systems is negative  Blood pressure 110/72, pulse 72, temperature 97.9 F (36.6 C), temperature source Oral, resp. rate 18, SpO2 99 %. General: young man, resting in bed, NAD HEENT: PERRL, EOMI, no scleral icterus, tongue with grayish/bluish discoloration in the posterior and lateral surfaces Cardiac: regular rate and rhythm, no rubs, murmurs or gallops Pulm: clear to auscultation bilaterally in the anterior lung fields, no wheezes Abd: soft, nontender, nondistended, BS present Ext: warm and well perfused, no pedal edema Skin: multiple hyperpigmented, flat lesions of all four extremities with some lesions on the abdomen Neuro: alert and oriented to name, place, year, responds to questions appropriately; moving all extremities freely   Results for orders placed or performed during the hospital encounter of 03/05/17 (from the past 48 hour(s))  CBC with Differential     Status: Abnormal   Collection Time: 03/05/17  6:24 PM  Result Value Ref Range   WBC 2.5 (L) 4.0 - 10.5 K/uL   RBC 3.97 (L) 4.22 - 5.81 MIL/uL   Hemoglobin 12.7 (L) 13.0 - 17.0 g/dL   HCT 36.3 (L) 39.0 - 52.0 %   MCV 91.4 78.0 - 100.0 fL   MCH 32.0 26.0 - 34.0 pg   MCHC 35.0 30.0 - 36.0 g/dL   RDW 14.0 11.5 - 15.5 %   Platelets 170 150 - 400 K/uL   Neutrophils Relative % 42 %   Lymphocytes Relative 29 %    Monocytes Relative 19 %   Eosinophils Relative 8 %   Basophils Relative 2 %   Neutro Abs 1.0 (L) 1.7 - 7.7 K/uL   Lymphs Abs 0.7 0.7 - 4.0 K/uL   Monocytes Absolute 0.5 0.1 - 1.0 K/uL   Eosinophils Absolute 0.2 0.0 - 0.7 K/uL   Basophils Absolute 0.1 0.0 - 0.1 K/uL   RBC Morphology TARGET CELLS     Comment: ELLIPTOCYTES   WBC Morphology MILD LEFT SHIFT (1-5% METAS, OCC MYELO, OCC BANDS)   Basic metabolic panel     Status: Abnormal   Collection Time: 03/05/17  6:24 PM  Result Value Ref Range   Sodium 139 135 - 145 mmol/L   Potassium 3.5 3.5 - 5.1 mmol/L   Chloride 106 101 -  111 mmol/L   CO2 24 22 - 32 mmol/L   Glucose, Bld 72 65 - 99 mg/dL   BUN 7 6 - 20 mg/dL   Creatinine, Ser 0.71 0.61 - 1.24 mg/dL   Calcium 8.4 (L) 8.9 - 10.3 mg/dL   GFR calc non Af Amer >60 >60 mL/min   GFR calc Af Amer >60 >60 mL/min    Comment: (NOTE) The eGFR has been calculated using the CKD EPI equation. This calculation has not been validated in all clinical situations. eGFR's persistently <60 mL/min signify possible Chronic Kidney Disease.    Anion gap 9 5 - 15  I-Stat CG4 Lactic Acid, ED     Status: Abnormal   Collection Time: 03/05/17  6:34 PM  Result Value Ref Range   Lactic Acid, Venous 2.31 (HH) 0.5 - 1.9 mmol/L   Comment NOTIFIED PHYSICIAN   I-Stat CG4 Lactic Acid, ED     Status: None   Collection Time: 03/05/17  8:49 PM  Result Value Ref Range   Lactic Acid, Venous 1.40 0.5 - 1.9 mmol/L  CBC     Status: Abnormal   Collection Time: 03/06/17 12:55 AM  Result Value Ref Range   WBC 2.3 (L) 4.0 - 10.5 K/uL   RBC 3.97 (L) 4.22 - 5.81 MIL/uL   Hemoglobin 12.3 (L) 13.0 - 17.0 g/dL   HCT 36.3 (L) 39.0 - 52.0 %   MCV 91.4 78.0 - 100.0 fL   MCH 31.0 26.0 - 34.0 pg   MCHC 33.9 30.0 - 36.0 g/dL   RDW 13.8 11.5 - 15.5 %   Platelets 168 150 - 400 K/uL  Comprehensive metabolic panel     Status: Abnormal   Collection Time: 03/06/17 12:55 AM  Result Value Ref Range   Sodium 137 135 - 145 mmol/L    Potassium 3.2 (L) 3.5 - 5.1 mmol/L   Chloride 106 101 - 111 mmol/L   CO2 25 22 - 32 mmol/L   Glucose, Bld 144 (H) 65 - 99 mg/dL   BUN 5 (L) 6 - 20 mg/dL   Creatinine, Ser 0.67 0.61 - 1.24 mg/dL   Calcium 8.0 (L) 8.9 - 10.3 mg/dL   Total Protein 7.9 6.5 - 8.1 g/dL   Albumin 3.0 (L) 3.5 - 5.0 g/dL   AST 68 (H) 15 - 41 U/L   ALT 33 17 - 63 U/L   Alkaline Phosphatase 191 (H) 38 - 126 U/L   Total Bilirubin 0.7 0.3 - 1.2 mg/dL   GFR calc non Af Amer >60 >60 mL/min   GFR calc Af Amer >60 >60 mL/min    Comment: (NOTE) The eGFR has been calculated using the CKD EPI equation. This calculation has not been validated in all clinical situations. eGFR's persistently <60 mL/min signify possible Chronic Kidney Disease.    Anion gap 6 5 - 15   Impression/Recommendation  Principal Problem:   Rectal fistula Active Problems:   H/O noncompliance with medical treatment - finincial, presenting hazards to health   AIDS ([place])   Acute hypokalemia   Anemia   Leukopenia   Peri-rectal abscess  Justin Ball is an 26 y.o. 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  Poorly controlled HIV complicated by non-adherence: CD4 20, VL 241000, September 2017.  -Defer ARV given history of non-adherence and  risk for mutation. At his last office visit with Dr. 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 -Discontinue ciprofloxacin/metronidazole in favor of Augmentin which also  offers anaerobe coverage given the risk of C. Dif in poorly controlled HIV infection. If he cannot tolerate oral intake, we would recommend Unasyn. Will defer coverage for Gram- rods as he may warrant surgery for definitive management. -Follow-up blood cultures   History of syphilis: Recheck RPR given skin lesions.  Chronic HBV: HBV surface Ag+, total core Ab+ back in October 2013.  -Check HBV viral load  Thank you so much for this interesting consult  Charlott Rakes 03/06/2017, 11:52 AM

## 2017-03-06 NOTE — Progress Notes (Signed)
PROGRESS NOTE    Justin VANDERMEULEN  ZOX:[id number] DOB: November 20, 1991 DOA: 03/05/2017 PCP: Acey Lav, MD   Chief Complaint  Patient presents with  . Rectal Pain    Brief Narrative:  26 y.o. male with medical history significant of HIV/AIDS, bipolar disorder, history of prior anal intraepithelial neoplasia III, noncompliance, and recurrent rectal abscesses/discharge; who presents with complaints of rectal pain. He notes that symptoms have been ongoing over the last 2-3 weeks.Patient reports severe rectal pain that is worsened with any movement or coughing. Just seen in the ED 2 days ago thought to have anal fissure and discharged home with clindamycin, nitroglycerin, and lidocaine. Patient reports being able to obtain the clindamycin and took it as advised.  The other prescriptions for the nitroglycerin and lidocaine he was unable to obtain due to cost.  CT showed improvement. General surgery and ID consulted. Assessment & Plan   Rectal fistula with abscess w/rectal pain -Patient reports worsening rectal pain and bloody discharge despite taking clindamycin.  -CT scan of abdomen and pelvis reveals decreased size of rectal abscess and fistula.  -Blood cultures pending -Currently afebrile, but with leukopenia -Continue antibiotics of metronidazole and ciprofloxacin IV -Continue pain control PRN -General surgery consulted and appreciated  Leukopenia -WBC 2.5 with left shift. Suspect secondary to above -Continue to monitor  Lactic acidosis -Now Resolved. Initial lactic acid 2.31 on admission, but then normal limits after initial therapies.  AIDS -Last CD4 count noted to be 20 on 09/20/2016.Sees Dr. Algis Liming of ID as outpatient. -Discontinued Prezcobix and Tivicay as patient has not been compliant and states these medications bother him at work -Infectious disease consulted and appreciated  Hypokalemia -Continue to replace and monitor BMP  History of noncompliance  -Patient  reports intermittent use of HARRT medications and financial issues. -Counseled patient on need of compliance with medications and decreased risks of infections.  Normocytic Anemia -Hemoglobin 12.3, appears to be at baseline -Continue to monitor CBC  H/O Bipolar disorder -Currently not on any medications.  H/O alcohol and tobacco use -Patient only reports drinking 3 beers per week at this time. -Counseled on the need of cessation of tobacco. -nicotine patch  DVT Prophylaxis  SCDS  Code Status: Full  Family Communication: None at bedside  Disposition Plan: Observation. Pending general surgery and ID consults.  Consultants Infectious disease General surgery  Procedures  None  Antibiotics   Anti-infectives    Start     Dose/Rate Route Frequency Ordered Stop   03/06/17 1000  dolutegravir (TIVICAY) tablet 50 mg  Status:  Discontinued     50 mg Oral Daily 03/05/17 2210 03/06/17 1110   03/06/17 0900  ciprofloxacin (CIPRO) IVPB 400 mg     400 mg 200 mL/hr over 60 Minutes Intravenous Every 12 hours 03/05/17 2210     03/06/17 0800  darunavir-cobicistat (PREZCOBIX) 800-150 MG per tablet 1 tablet  Status:  Discontinued    Comments:  Swallow whole. Do NOT crush, break or chew tablets. Take with food.     1 tablet Oral Daily with breakfast 03/05/17 2210 03/06/17 1110   03/06/17 0500  metroNIDAZOLE (FLAGYL) IVPB 500 mg     500 mg 100 mL/hr over 60 Minutes Intravenous Every 8 hours 03/05/17 2210     03/05/17 2015  ciprofloxacin (CIPRO) IVPB 400 mg     400 mg 200 mL/hr over 60 Minutes Intravenous  Once 03/05/17 2007 03/05/17 2200   03/05/17 2015  metroNIDAZOLE (FLAGYL) IVPB 500 mg     500  mg 100 mL/hr over 60 Minutes Intravenous  Once 03/05/17 2007 03/05/17 2202      Subjective:   Justin Ball seen and examined today.  Patient continues to complain of 9/10 pain and his rectal area.  Denies chest pain, shortness of breath, abdominal pain, N/V. States he has not been compliant  with his medications.  Objective:   Vitals:   03/05/17 2200 03/05/17 2245 03/05/17 2328 03/06/17 0528  BP: 117/82 128/76 126/78 110/72  Pulse: 66 67 64 72  Resp:   18 18  Temp:   98.7 F (37.1 C) 97.9 F (36.6 C)  TempSrc:   Oral Oral  SpO2: 100% 100% 100% 99%    Intake/Output Summary (Last 24 hours) at 03/06/17 1308 Last data filed at 03/06/17 1000  Gross per 24 hour  Intake              240 ml  Output                1 ml  Net              239 ml   There were no vitals filed for this visit.  Exam  General: Well developed, well nourished, NAD, appears stated age  HEENT: NCAT, mucous membranes moist.   Cardiovascular: S1 S2 auscultated, no rubs, murmurs or gallops. Regular rate and rhythm.  Respiratory: Clear to auscultation bilaterally with equal chest rise  Abdomen: Soft, nontender, nondistended, + bowel sounds  Extremities: warm dry without cyanosis clubbing or edema  Neuro: AAOx3, nonfocal  Psych: Normal affect and demeanor   Data Reviewed: I have personally reviewed following labs and imaging studies  CBC:  Recent Labs Lab 03/03/17 0801 03/05/17 1824 03/06/17 0055  WBC 3.0* 2.5* 2.3*  NEUTROABS 1.6* 1.0*  --   HGB 12.9* 12.7* 12.3*  HCT 37.9* 36.3* 36.3*  MCV 91.8 91.4 91.4  PLT 154 170 168   Basic Metabolic Panel:  Recent Labs Lab 03/03/17 0801 03/05/17 1824 03/06/17 0055  NA 141 139 137  K 3.3* 3.5 3.2*  CL 106 106 106  CO2 24 24 25   GLUCOSE 69 72 144*  BUN 8 7 5*  CREATININE 0.71 0.71 0.67  CALCIUM 8.5* 8.4* 8.0*   GFR: CrCl cannot be calculated (Unknown ideal weight.). Liver Function Tests:  Recent Labs Lab 03/06/17 0055  AST 68*  ALT 33  ALKPHOS 191*  BILITOT 0.7  PROT 7.9  ALBUMIN 3.0*   No results for input(s): LIPASE, AMYLASE in the last 168 hours. No results for input(s): AMMONIA in the last 168 hours. Coagulation Profile: No results for input(s): INR, PROTIME in the last 168 hours. Cardiac Enzymes: No results  for input(s): CKTOTAL, CKMB, CKMBINDEX, TROPONINI in the last 168 hours. BNP (last 3 results) No results for input(s): PROBNP in the last 8760 hours. HbA1C: No results for input(s): HGBA1C in the last 72 hours. CBG: No results for input(s): GLUCAP in the last 168 hours. Lipid Profile: No results for input(s): CHOL, HDL, LDLCALC, TRIG, CHOLHDL, LDLDIRECT in the last 72 hours. Thyroid Function Tests: No results for input(s): TSH, T4TOTAL, FREET4, T3FREE, THYROIDAB in the last 72 hours. Anemia Panel: No results for input(s): VITAMINB12, FOLATE, FERRITIN, TIBC, IRON, RETICCTPCT in the last 72 hours. Urine analysis:    Component Value Date/Time   COLORURINE YELLOW 12/06/2016 0206   APPEARANCEUR CLEAR 12/06/2016 0206   LABSPEC 1.021 12/06/2016 0206   PHURINE 6.0 12/06/2016 0206   GLUCOSEU NEGATIVE 12/06/2016 0206   HGBUR  NEGATIVE 12/06/2016 0206   BILIRUBINUR NEGATIVE 12/06/2016 0206   KETONESUR NEGATIVE 12/06/2016 0206   PROTEINUR NEGATIVE 12/06/2016 0206   UROBILINOGEN 4.0 (H) 09/09/2014 1055   NITRITE NEGATIVE 12/06/2016 0206   LEUKOCYTESUR NEGATIVE 12/06/2016 0206   Sepsis Labs: @LABRCNTIP (procalcitonin:4,lacticidven:4)  )No results found for this or any previous visit (from the past 240 hour(s)).    Radiology Studies: Ct Pelvis W Contrast  Addendum Date: 03/05/2017   ADDENDUM REPORT: 03/05/2017 20:11 ADDENDUM: A second smaller collection containing small pockets of air noted more inferiorly likely in the soft tissues which appears to communicate to the more superior perirectal collection via a tract (best seen on sagittal series 6, image 76). Findings likely represent a perirectal abscess with fistulous communication to the rectum and skin. Correlation with clinical exam recommended. Overall the size of this tract and collection appear smaller since the prior study. Electronically Signed   By: Elgie CollardArash  Radparvar M.D.   On: 03/05/2017 20:11   Result Date: 03/05/2017 CLINICAL DATA:   26 year old male complaining of rectal pain. EXAM: CT PELVIS WITH CONTRAST TECHNIQUE: Multidetector CT imaging of the pelvis was performed using the standard protocol following the bolus administration of intravenous contrast. CONTRAST:  1 ISOVUE-300 IOPAMIDOL (ISOVUE-300) INJECTION 61% COMPARISON:  CT dated 03/03/2017 FINDINGS: Urinary Tract: The visualized ureters and urinary bladder appear unremarkable. Bowel: There is mild thickened appearance of the rectal with multiple small perirectal lymph nodes. A 16 x 7 x 7 mm low attenuating collection containing fluid and small pockets of air along the course of the distal rectum anterior and inferior to the levator ani muscle (series 3, image 44 and sagittal series 6, image 77) is noted which is appears smaller compared to the prior CT an suspicious for residual abscess. This can be better evaluated with rectal contrast. There is no evidence of bowel obstruction. Normal appendix. Vascular/Lymphatic: No pathologically enlarged lymph nodes. No significant vascular abnormality seen. Reproductive: The visualized prostate and seminal vesicles are grossly unremarkable. Other:  None Musculoskeletal: No suspicious bone lesions identified. IMPRESSION: Mild thickened appearance of the rectal wall with a very small ill-defined perirectal collection suspicious for residual perirectal abscess. There has been interval decrease in the size of this perirectal collection compared to the prior CT. CT with rectal contrast may provide better evaluation if clinically indicated. Electronically Signed: By: Elgie CollardArash  Radparvar M.D. On: 03/05/2017 19:55   Portable Chest 1 View  Result Date: 03/05/2017 CLINICAL DATA:  [age] y/o  M; cough. EXAM: PORTABLE CHEST 1 VIEW COMPARISON:  None. FINDINGS: The heart size and mediastinal contours are within normal limits. Both lungs are clear. The visualized skeletal structures are unremarkable. IMPRESSION: No active disease. Electronically Signed   By: Mitzi HansenLance   Furusawa-Stratton M.D.   On: 03/05/2017 22:36     Scheduled Meds: . ciprofloxacin  400 mg Intravenous Q12H  . guaiFENesin  600 mg Oral BID  . Influenza vac split quadrivalent PF  0.5 mL Intramuscular Tomorrow-1000  . metronidazole  500 mg Intravenous Q8H  . nicotine  7 mg Transdermal Q0600   Continuous Infusions:   LOS: 0 days   Time Spent in minutes   30 minutes  Justin Ball D.O. on 03/06/2017 at 1:08 PM  Between 7am to 7pm - Pager - [phone]  After 7pm go to [email or web address] - password [email or web address]  And look for the night coverage person covering for me after hours  [facility] Office  [phone]

## 2017-03-07 ENCOUNTER — Encounter (HOSPITAL_COMMUNITY): Payer: Self-pay

## 2017-03-07 LAB — BASIC METABOLIC PANEL
ANION GAP: 7 (ref 5–15)
BUN: 5 mg/dL — ABNORMAL LOW (ref 6–20)
CALCIUM: 8.9 mg/dL (ref 8.9–10.3)
CO2: 29 mmol/L (ref 22–32)
CREATININE: 0.95 mg/dL (ref 0.61–1.24)
Chloride: 104 mmol/L (ref 101–111)
GFR calc Af Amer: >60 mL/min (ref >60)
GFR calc non Af Amer: >60 mL/min (ref >60)
GLUCOSE: 77 mg/dL (ref 65–99)
POTASSIUM: 3.7 mmol/L (ref 3.5–5.1)
Sodium: 140 mmol/L (ref 135–145)

## 2017-03-07 LAB — CBC
HEMATOCRIT: 38.6 % — AB (ref 39.0–52.0)
Hemoglobin: 12.9 g/dL — ABNORMAL LOW (ref 13.0–17.0)
MCH: 30.7 pg (ref 26.0–34.0)
MCHC: 33.4 g/dL (ref 30.0–36.0)
MCV: 91.9 fL (ref 78.0–100.0)
PLATELETS: 200 10*3/uL (ref 150–400)
RBC: 4.2 MIL/uL — AB (ref 4.22–5.81)
RDW: 13.9 % (ref 11.5–15.5)
WBC: 2.4 10*3/uL — AB (ref 4.0–10.5)

## 2017-03-07 LAB — RPR: RPR: NONREACTIVE

## 2017-03-07 MED ORDER — OXYCODONE HCL 5 MG PO TABS: 5.0000 mg | ORAL_TABLET | ORAL | 0 refills | Status: DC | PRN

## 2017-03-07 MED ORDER — AMOXICILLIN-POT CLAVULANATE 875-125 MG PO TABS: 1.0000 | ORAL_TABLET | Freq: Two times a day (BID) | ORAL | 0 refills | Status: DC

## 2017-03-07 MED ORDER — SULFAMETHOXAZOLE-TRIMETHOPRIM 800-160 MG PO TABS: 1.0000 | ORAL_TABLET | Freq: Every day | ORAL | 0 refills | Status: DC

## 2017-03-07 NOTE — Progress Notes (Signed)
Justin Ball to be D/C'd Home per MD order.  Discussed with the patient and all questions fully answered.  VSS, Skin clean, dry and intact without evidence of skin break down, no evidence of skin tears noted. IV catheter discontinued intact. Site without signs and symptoms of complications. Dressing and pressure applied.  An After Visit Summary was printed and given to the patient. Patient received prescription.  D/c education completed with patient/family including follow up instructions, medication list, d/c activities limitations if indicated, with other d/c instructions as indicated by MD - patient able to verbalize understanding, all questions fully answered.   Patient instructed to return to ED, call 911, or call MD for any changes in condition.   Patient escorted via WC, and D/C home via private auto.  [name] [name] 03/07/2017 3:21 PM

## 2017-03-07 NOTE — Discharge Instructions (Addendum)
°  Follow up with Dr. [name] within one week Follow up with Dr. Daiva EvesVan Dam to discuss antiretroviral regimen. For now, antiretroviral regimen is on hold per Infectious disease specialist until you are established in the clinic.   Anal Fissure, Adult An anal fissure is a small tear or crack in the skin around the anus. Bleeding from a fissure usually stops on its own within a few minutes. However, bleeding will often occur again with each bowel movement until the crack heals. What are the causes? This condition may be caused by:  Passing large, hard stool (feces).  Frequent diarrhea.  Constipation.  Inflammatory bowel disease (Crohn disease or ulcerative colitis).  Infections.  Anal sex. What are the signs or symptoms? Symptoms of this condition include:  Bleeding from the rectum.  Small amounts of blood seen on your stool, on toilet paper, or in the toilet after a bowel movement.  Painful bowel movements.  Itching or irritation around the anus. How is this diagnosed? A health care provider may diagnose this condition by closely examining the anal area. An anal fissure can usually be seen with careful inspection. In some cases, a rectal exam may be performed, or a short tube (anoscope) may be used to examine the anal canal. How is this treated? Treatment for this condition may include:  Taking steps to avoid constipation. This may include making changes to your diet, such as increasing your intake of fiber or fluid.  Taking fiber supplements. These supplements can soften your stool to help make bowel movements easier. Your health care provider may also prescribe a stool softener if your stool is often hard.  Taking sitz baths. This may help to heal the tear.  Using medicated creams or ointments. These may be prescribed to lessen discomfort. Follow these instructions at home: Eating and drinking   Avoid foods that may be constipating, such as bananas and dairy products.  Drink  enough fluid to keep your urine clear or pale yellow.  Maintain a diet that is high in fruits, whole grains, and vegetables. General instructions   Keep the anal area as clean and dry as possible.  Take sitz baths as told by your health care provider. Do not use soap in the sitz baths.  Take over-the-counter and prescription medicines only as told by your health care provider.  Use creams or ointments only as told by your health care provider.  Keep all follow-up visits as told by your health care provider. This is important. Contact a health care provider if:  You have more bleeding.  You have a fever.  You have diarrhea that is mixed with blood.  You continue to have pain.  Your problem is getting worse rather than better. This information is not intended to replace advice given to you by your health care provider. Make sure you discuss any questions you have with your health care provider. Document Released: 12/11/2005 Document Revised: 04/19/2016 Document Reviewed: 03/08/2015 Elsevier Interactive Patient Education  2017 [employer]

## 2017-03-07 NOTE — Progress Notes (Addendum)
Pt drank 1,000 ml of coke and then vomited. Pt says that his meds made him vomit. Pt is now eating lunch. Will continue to assess.

## 2017-03-07 NOTE — Final Consult Note (Signed)
Consultant Final Sign-Off Note    Assessment/Final recommendations  Justin Ball is a 26 y.o. male followed by me for rectal pain. Pt has rectal fistula with possible abscess on CT scan. Pt also has a anal fissure.    Wound care (if applicable): sitz bath and high fiber diet   Diet at discharge: per primary team   Activity at discharge: per primary team   Follow-up appointment:  Dr. Michaell Cowing with [facility]   Pending results:  Unresulted Labs    Start     Ordered   03/08/17 0500  CBC  Tomorrow morning,   R     03/07/17 0952   03/08/17 0500  Basic metabolic panel  Tomorrow morning,   R     03/07/17 0952   03/07/17 0500  RPR  Tomorrow morning,   R     03/06/17 1419   03/07/17 0500  [facility] HBV SuperQuant  Tomorrow morning,   R     03/06/17 1419   03/07/17 0500  Hepatitis B surface antigen  Tomorrow morning,   R     03/06/17 1459   03/07/17 0500  Hepatitis B surface antibody  Tomorrow morning,   R     03/06/17 1459   03/07/17 0500  Hepatitis B e antigen  Tomorrow morning,   R     03/06/17 1459   03/07/17 0500  Miscellaneous [facility] test (send-out)  Tomorrow morning,   R    Question:  Test name / description:  Answer:  Hepatitis B DNA quantitative   03/06/17 1459   03/06/17 0100  Culture, blood (Routine X 2) w Reflex to ID Panel  Once,   STAT     03/06/17 0100       Medication recommendations: 5 days oral antibiotics   Other recommendations: None    Thank you for allowing [place] to participate in the care of your patient!  Please consult [place] again if you have further needs for your patient.  Jerre Simon 03/07/2017 10:26 AM    Subjective   Pt is feeling better today. Less rectal pain.   Objective  Vital signs in last 24 hours: Temp:  [98.3 F (36.8 C)-99.7 F (37.6 C)] 99.7 F (37.6 C) (03/14 0554) Pulse Rate:  [71-86] 86 (03/14 0554) Resp:  [15-17] 17 (03/14 0554) BP: (112-120)/(72-73) 113/73 (03/14 0554) SpO2:  [100 %] 100 % (03/14  0554)  General: Constitutional: He is oriented to person, place, and time. No distress. Thin, frail appearing, AA male, in no acute distress  HENT:  Head: Normocephalic and atraumatic.  Eyes: Conjunctivae are normal. Right eye exhibits no discharge. Left eye exhibits no discharge. No scleral icterus.   Genitourinary: Rectal exam shows a fissure and less TTP today, no appreciated area of fluctuance Musculoskeletal: Normal range of motion. He exhibits no edema, tenderness or deformity.  Nursing note and vitals reviewed.    Pertinent labs and Studies:  Recent Labs  03/05/17 1824 03/06/17 0055 03/07/17 0515  WBC 2.5* 2.3* 2.4*  HGB 12.7* 12.3* 12.9*  HCT 36.3* 36.3* 38.6*   BMET  Recent Labs  03/06/17 0055 03/07/17 0515  NA 137 140  K 3.2* 3.7  CL 106 104  CO2 25 29  GLUCOSE 144* 77  BUN 5* 5*  CREATININE 0.67 0.95  CALCIUM 8.0* 8.9   No results for input(s): LABURIN in the last 72 hours. Results for orders placed or performed during the hospital encounter of 03/05/17  Blood culture (routine  x 2)     Status: None (Preliminary result)   Collection Time: 03/05/17  8:35 PM  Result Value Ref Range Status   Specimen Description BLOOD RIGHT HAND  Final   Special Requests BOTTLES DRAWN AEROBIC ONLY 5CC  Final   Culture NO GROWTH < 24 HOURS  Final   Report Status PENDING  Incomplete    Imaging: No results found.  Mattie Marlin, [facility] Pager [phone]

## 2017-03-07 NOTE — Progress Notes (Signed)
Subjective:  This morning, he turns the faucet on/off to explain his difficulty urinating. He feels he is unable to void completely and has to use the restroom more frequently as a result. He denies any dysuria or dribbling after he finishes voiding and feels this problem has been more since he has been here in the hospital.   Antibiotics:  Anti-infectives    Start     Dose/Rate Route Frequency Ordered Stop   03/07/17 1000  amoxicillin-clavulanate (AUGMENTIN) 875-125 MG per tablet 1 tablet  Status:  Discontinued     1 tablet Oral 2 times daily 03/06/17 1414 03/06/17 1454   03/07/17 1000  sulfamethoxazole-trimethoprim (BACTRIM,SEPTRA) 400-80 MG per tablet 1 tablet  Status:  Discontinued     1 tablet Oral Daily 03/06/17 1455 03/06/17 1529   03/07/17 0600  ampicillin (OMNIPEN) 2 g in sodium chloride 0.9 % 50 mL IVPB  Status:  Discontinued     2 g 150 mL/hr over 20 Minutes Intravenous Every 8 hours 03/06/17 1454 03/06/17 1537   03/07/17 0000  Ampicillin-Sulbactam (UNASYN) 3 g in sodium chloride 0.9 % 100 mL IVPB     3 g 200 mL/hr over 30 Minutes Intravenous Every 6 hours 03/06/17 1537     03/06/17 1529  sulfamethoxazole-trimethoprim (BACTRIM,SEPTRA) 400-80 MG per tablet 1 tablet     1 tablet Oral Daily 03/06/17 1529     03/06/17 1415  sulfamethoxazole-trimethoprim (BACTRIM,SEPTRA) 400-80 MG per tablet 1 tablet  Status:  Discontinued     1 tablet Oral Every 12 hours 03/06/17 1414 03/06/17 1455   03/06/17 1000  dolutegravir (TIVICAY) tablet 50 mg  Status:  Discontinued     50 mg Oral Daily 03/05/17 2210 03/06/17 1110   03/06/17 0900  ciprofloxacin (CIPRO) IVPB 400 mg  Status:  Discontinued     400 mg 200 mL/hr over 60 Minutes Intravenous Every 12 hours 03/05/17 2210 03/06/17 1414   03/06/17 0800  darunavir-cobicistat (PREZCOBIX) 800-150 MG per tablet 1 tablet  Status:  Discontinued    Comments:  Swallow whole. Do NOT crush, break or chew tablets. Take with food.     1 tablet Oral  Daily with breakfast 03/05/17 2210 03/06/17 1110   03/06/17 0500  metroNIDAZOLE (FLAGYL) IVPB 500 mg  Status:  Discontinued     500 mg 100 mL/hr over 60 Minutes Intravenous Every 8 hours 03/05/17 2210 03/06/17 1414   03/05/17 2015  ciprofloxacin (CIPRO) IVPB 400 mg     400 mg 200 mL/hr over 60 Minutes Intravenous  Once 03/05/17 2007 03/05/17 2200   03/05/17 2015  metroNIDAZOLE (FLAGYL) IVPB 500 mg     500 mg 100 mL/hr over 60 Minutes Intravenous  Once 03/05/17 2007 03/05/17 2202      Medications: Scheduled Meds: . ampicillin-sulbactam (UNASYN) IV  3 g Intravenous Q6H  . guaiFENesin  600 mg Oral BID  . Influenza vac split quadrivalent PF  0.5 mL Intramuscular Tomorrow-1000  . nicotine  7 mg Transdermal Q0600  . sulfamethoxazole-trimethoprim  1 tablet Oral Daily   Continuous Infusions: PRN Meds:.acetaminophen **OR** acetaminophen, hydrocortisone cream, HYDROmorphone (DILAUDID) injection, ondansetron **OR** ondansetron (ZOFRAN) IV, oxyCODONE    Objective: Weight change:   Intake/Output Summary (Last 24 hours) at 03/07/17 1018 Last data filed at 03/07/17 0639  Gross per 24 hour  Intake              200 ml  Output  0 ml  Net              200 ml   Blood pressure 113/73, pulse 86, temperature 99.7 F (37.6 C), resp. rate 17, SpO2 100 %. Temp:  [98.3 F (36.8 C)-99.7 F (37.6 C)] 99.7 F (37.6 C) (03/14 0554) Pulse Rate:  [71-86] 86 (03/14 0554) Resp:  [15-17] 17 (03/14 0554) BP: (112-120)/(72-73) 113/73 (03/14 0554) SpO2:  [100 %] 100 % (03/14 0554)  Physical Exam: General: young man, resting in bed,  HEENT: EOMI, no scleral icterus Cardiac: RRR, no rubs, murmurs or gallops Pulm: clear to auscultation bilaterally in the anterior lung fields, no wheezes, rales, or rhonchi Abd: soft, nontender, nondistended, BS present Ext: warm and well perfused, no pedal edema Skin: multiple, flat hyperpigmented lesions noted on all four extremities Neuro: responds to  questions appropriately; moving all extremities freely, no difficulty with ambulation  CBC Latest Ref Rng & Units 03/07/2017 03/06/2017 03/05/2017  WBC 4.0 - 10.5 K/uL 2.4(L) 2.3(L) 2.5(L)  Hemoglobin 13.0 - 17.0 g/dL 12.9(L) 12.3(L) 12.7(L)  Hematocrit 39.0 - 52.0 % 38.6(L) 36.3(L) 36.3(L)  Platelets 150 - 400 K/uL 200 168 170     BMET  Recent Labs  03/06/17 0055 03/07/17 0515  NA 137 140  K 3.2* 3.7  CL 106 104  CO2 25 29  GLUCOSE 144* 77  BUN 5* 5*  CREATININE 0.67 0.95  CALCIUM 8.0* 8.9    Liver Panel   Recent Labs  03/06/17 0055  PROT 7.9  ALBUMIN 3.0*  AST 68*  ALT 33  ALKPHOS 191*  BILITOT 0.7    Micro Results: Recent Results (from the past 720 hour(s))  Blood culture (routine x 2)     Status: None (Preliminary result)   Collection Time: 03/05/17  8:35 PM  Result Value Ref Range Status   Specimen Description BLOOD RIGHT HAND  Final   Special Requests BOTTLES DRAWN AEROBIC ONLY 5CC  Final   Culture NO GROWTH < 24 HOURS  Final   Report Status PENDING  Incomplete    Studies/Results: Ct Pelvis W Contrast  Addendum Date: 03/05/2017   ADDENDUM REPORT: 03/05/2017 20:11 ADDENDUM: A second smaller collection containing small pockets of air noted more inferiorly likely in the soft tissues which appears to communicate to the more superior perirectal collection via a tract (best seen on sagittal series 6, image 76). Findings likely represent a perirectal abscess with fistulous communication to the rectum and skin. Correlation with clinical exam recommended. Overall the size of this tract and collection appear smaller since the prior study. Electronically Signed   By: Elgie Collard M.D.   On: 03/05/2017 20:11   Result Date: 03/05/2017 CLINICAL DATA:  26 year old male complaining of rectal pain. EXAM: CT PELVIS WITH CONTRAST TECHNIQUE: Multidetector CT imaging of the pelvis was performed using the standard protocol following the bolus administration of intravenous  contrast. CONTRAST:  1 ISOVUE-300 IOPAMIDOL (ISOVUE-300) INJECTION 61% COMPARISON:  CT dated 03/03/2017 FINDINGS: Urinary Tract: The visualized ureters and urinary bladder appear unremarkable. Bowel: There is mild thickened appearance of the rectal with multiple small perirectal lymph nodes. A 16 x 7 x 7 mm low attenuating collection containing fluid and small pockets of air along the course of the distal rectum anterior and inferior to the levator ani muscle (series 3, image 44 and sagittal series 6, image 77) is noted which is appears smaller compared to the prior CT an suspicious for residual abscess. This can be better evaluated with rectal contrast.  There is no evidence of bowel obstruction. Normal appendix. Vascular/Lymphatic: No pathologically enlarged lymph nodes. No significant vascular abnormality seen. Reproductive: The visualized prostate and seminal vesicles are grossly unremarkable. Other:  None Musculoskeletal: No suspicious bone lesions identified. IMPRESSION: Mild thickened appearance of the rectal wall with a very small ill-defined perirectal collection suspicious for residual perirectal abscess. There has been interval decrease in the size of this perirectal collection compared to the prior CT. CT with rectal contrast may provide better evaluation if clinically indicated. Electronically Signed: By: Elgie CollardArash  Radparvar M.D. On: 03/05/2017 19:55   Portable Chest 1 View  Result Date: 03/05/2017 CLINICAL DATA:  26 y/o  M; cough. EXAM: PORTABLE CHEST 1 VIEW COMPARISON:  None. FINDINGS: The heart size and mediastinal contours are within normal limits. Both lungs are clear. The visualized skeletal structures are unremarkable. IMPRESSION: No active disease. Electronically Signed   By: Mitzi HansenLance  Furusawa-Stratton M.D.   On: 03/05/2017 22:36    Assessment/Plan:  Principal Problem:   Rectal fistula Active Problems:   H/O noncompliance with medical treatment - finincial, presenting hazards to health    AIDS (HCC)   Acute hypokalemia   Anemia   Leukopenia   Peri-rectal abscess  Justin Ball is a 26 y.o. 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  Poorly controlled HIV complicated by non-adherence: Emphasized follow-up with his ID physician and will defer ARV given risk of further mutations. 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  -Continue Unasyn IV Marcell.Ricker[Day 3] pending surgical recommendation. If he is to be discharged, Augmentin would be the choice of therapy.  History of syphilis: RPR pending.  Chronic HBV: HBV surface Ag, e Ag, viral load pending.    LOS: 1 day   Heywood Ilesatel, Jona Zappone 03/07/2017, 10:18 AM

## 2017-03-07 NOTE — Progress Notes (Signed)
CSW made referral to CM after receiving consult for pt issues with obtaining medications and noncompliance. CM spoke with pt and pt stated he has insurance and has no problems  affording/obtaining medications.  Pt states he's non complaint  with taking medication 2/2 medication makes him sick. States he's going to f/u with his ID MD. Gae GallopAngela Dekay RN,BSN,CM

## 2017-03-07 NOTE — Progress Notes (Signed)
PROGRESS NOTE    Justin Ball  ZOX:[id number] DOB: October 26, 1991 DOA: 03/05/2017   PCP: Acey Lav, MD   Chief Complaint  Patient presents with  . Rectal Pain    Brief Narrative:  26 y.o. male with medical history significant of HIV/AIDS, bipolar disorder, history of prior anal intraepithelial neoplasia III, noncompliance, and recurrent rectal abscesses/discharge; who presents with complaints of rectal pain. He notes that symptoms have been ongoing over the last 2-3 weeks.Patient reports severe rectal pain that is worsened with any movement or coughing. Just seen in the ED 2 days ago thought to have anal fissure and discharged home with clindamycin, nitroglycerin, and lidocaine. Patient reports being able to obtain the clindamycin and took it as advised.  The other prescriptions for the nitroglycerin and lidocaine he was unable to obtain due to cost.  CT showed improvement. General surgery and ID consulted.  Assessment & Plan   Rectal fistula with abscess w/rectal pain - CT scan of abdomen and pelvis reveals decreased size of rectal abscess and fistula.  - Blood cultures pending - Currently afebrile, but with persistent leukopenia - Continue antibiotics of metronidazole and ciprofloxacin IV - Continue pain control PRN - General surgery consulted and appreciated - pt NPO as he may need eua per surgery team   Leukopenia - WBC 2.5 with left shift. Suspect secondary to above - Continue to monitor - CBC in AM  Lactic acidosis - Now Resolved. Initial lactic acid 2.31 on admission  AIDS - Last CD4 count noted to be 20 on 09/20/2016.Sees Dr. Algis Liming of ID as outpatient. - Discontinued Prezcobix and Tivicay as patient has not been compliant and states these medications bother him at work - Infectious disease consulted and appreciated  Hypokalemia - supplemented and WNL  History of noncompliance  - Patient reports intermittent use of HARRT medications and financial issues. -  Counseled patient on need of compliance with medications   Normocytic Anemia - Hemoglobin 12.9, appears to be at baseline - Continue to monitor CBC  H/O Bipolar disorder - Currently not on any medications. - mood stable and pleasant this AM   H/O alcohol and tobacco use - Patient only reports drinking 3 beers per week at this time. - provide nicotine patch  DVT Prophylaxis  SCD's Code Status: Full Family Communication: None at bedside Disposition Plan: home once surgery team clears   Consultants  Infectious disease  General surgery  Procedures   None  Antibiotics   Anti-infectives    Start     Dose/Rate Route Frequency Ordered Stop   03/07/17 1000  amoxicillin-clavulanate (AUGMENTIN) 875-125 MG per tablet 1 tablet  Status:  Discontinued     1 tablet Oral 2 times daily 03/06/17 1414 03/06/17 1454   03/07/17 1000  sulfamethoxazole-trimethoprim (BACTRIM,SEPTRA) 400-80 MG per tablet 1 tablet  Status:  Discontinued     1 tablet Oral Daily 03/06/17 1455 03/06/17 1529   03/07/17 0600  ampicillin (OMNIPEN) 2 g in sodium chloride 0.9 % 50 mL IVPB  Status:  Discontinued     2 g 150 mL/hr over 20 Minutes Intravenous Every 8 hours 03/06/17 1454 03/06/17 1537   03/07/17 0000  Ampicillin-Sulbactam (UNASYN) 3 g in sodium chloride 0.9 % 100 mL IVPB     3 g 200 mL/hr over 30 Minutes Intravenous Every 6 hours 03/06/17 1537     03/06/17 1529  sulfamethoxazole-trimethoprim (BACTRIM,SEPTRA) 400-80 MG per tablet 1 tablet     1 tablet Oral Daily 03/06/17 1529  03/06/17 1415  sulfamethoxazole-trimethoprim (BACTRIM,SEPTRA) 400-80 MG per tablet 1 tablet  Status:  Discontinued     1 tablet Oral Every 12 hours 03/06/17 1414 03/06/17 1455   03/06/17 1000  dolutegravir (TIVICAY) tablet 50 mg  Status:  Discontinued     50 mg Oral Daily 03/05/17 2210 03/06/17 1110   03/06/17 0900  ciprofloxacin (CIPRO) IVPB 400 mg  Status:  Discontinued     400 mg 200 mL/hr over 60 Minutes Intravenous Every  12 hours 03/05/17 2210 03/06/17 1414   03/06/17 0800  darunavir-cobicistat (PREZCOBIX) 800-150 MG per tablet 1 tablet  Status:  Discontinued    Comments:  Swallow whole. Do NOT crush, break or chew tablets. Take with food.     1 tablet Oral Daily with breakfast 03/05/17 2210 03/06/17 1110   03/06/17 0500  metroNIDAZOLE (FLAGYL) IVPB 500 mg  Status:  Discontinued     500 mg 100 mL/hr over 60 Minutes Intravenous Every 8 hours 03/05/17 2210 03/06/17 1414   03/05/17 2015  ciprofloxacin (CIPRO) IVPB 400 mg     400 mg 200 mL/hr over 60 Minutes Intravenous  Once 03/05/17 2007 03/05/17 2200   03/05/17 2015  metroNIDAZOLE (FLAGYL) IVPB 500 mg     500 mg 100 mL/hr over 60 Minutes Intravenous  Once 03/05/17 2007 03/05/17 2202      Subjective:   Still with rectal pain 7/10 in severity this AM.   Objective:   Vitals:   03/06/17 0528 03/06/17 1443 03/06/17 2211 03/07/17 0554  BP: 110/72 120/73 112/72 113/73  Pulse: 72 71 79 86  Resp: 18 15 17 17   Temp: 97.9 F (36.6 C) 98.3 F (36.8 C) 98.7 F (37.1 C) 99.7 F (37.6 C)  TempSrc: Oral Oral Oral   SpO2: 99% 100% 100% 100%    Intake/Output Summary (Last 24 hours) at 03/07/17 0947 Last data filed at 03/07/17 9604  Gross per 24 hour  Intake              200 ml  Output                1 ml  Net              199 ml   There were no vitals filed for this visit.  Exam  General: Well developed, well nourished, NAD, appears stated age  HEENT: NCAT, mucous membranes moist.   Cardiovascular: S1 S2 auscultated, no rubs, murmurs or gallops. Regular rate and rhythm.  Respiratory: Clear to auscultation bilaterally with equal chest rise  Abdomen: Soft, nontender, nondistended, + bowel sounds  Extremities: warm dry without cyanosis clubbing or edema  Data Reviewed: I have personally reviewed following labs and imaging studies  CBC:  Recent Labs Lab 03/03/17 0801 03/05/17 1824 03/06/17 0055 03/07/17 0515  WBC 3.0* 2.5* 2.3* 2.4*    NEUTROABS 1.6* 1.0*  --   --   HGB 12.9* 12.7* 12.3* 12.9*  HCT 37.9* 36.3* 36.3* 38.6*  MCV 91.8 91.4 91.4 91.9  PLT 154 170 168 200   Basic Metabolic Panel:  Recent Labs Lab 03/03/17 0801 03/05/17 1824 03/06/17 0055 03/07/17 0515  NA 141 139 137 140  K 3.3* 3.5 3.2* 3.7  CL 106 106 106 104  CO2 24 24 25 29   GLUCOSE 69 72 144* 77  BUN 8 7 5* 5*  CREATININE 0.71 0.71 0.67 0.95  CALCIUM 8.5* 8.4* 8.0* 8.9   Liver Function Tests:  Recent Labs Lab 03/06/17 0055  AST 68*  ALT 33  ALKPHOS 191*  BILITOT 0.7  PROT 7.9  ALBUMIN 3.0*   Urine analysis:    Component Value Date/Time   COLORURINE YELLOW 12/06/2016 0206   APPEARANCEUR CLEAR 12/06/2016 0206   LABSPEC 1.021 12/06/2016 0206   PHURINE 6.0 12/06/2016 0206   GLUCOSEU NEGATIVE 12/06/2016 0206   HGBUR NEGATIVE 12/06/2016 0206   BILIRUBINUR NEGATIVE 12/06/2016 0206   KETONESUR NEGATIVE 12/06/2016 0206   PROTEINUR NEGATIVE 12/06/2016 0206   UROBILINOGEN 4.0 (H) 09/09/2014 1055   NITRITE NEGATIVE 12/06/2016 0206   LEUKOCYTESUR NEGATIVE 12/06/2016 0206   Recent Results (from the past 240 hour(s))  Blood culture (routine x 2)     Status: None (Preliminary result)   Collection Time: 03/05/17  8:35 PM  Result Value Ref Range Status   Specimen Description BLOOD RIGHT HAND  Final   Special Requests BOTTLES DRAWN AEROBIC ONLY 5CC  Final   Culture NO GROWTH < 24 HOURS  Final   Report Status PENDING  Incomplete    Radiology Studies: Ct Pelvis W Contrast  Addendum Date: 03/05/2017   ADDENDUM REPORT: 03/05/2017 20:11 ADDENDUM: A second smaller collection containing small pockets of air noted more inferiorly likely in the soft tissues which appears to communicate to the more superior perirectal collection via a tract (best seen on sagittal series 6, image 76). Findings likely represent a perirectal abscess with fistulous communication to the rectum and skin. Correlation with clinical exam recommended. Overall the size of  this tract and collection appear smaller since the prior study. Electronically Signed   By: Elgie Collard M.D.   On: 03/05/2017 20:11   Result Date: 03/05/2017 CLINICAL DATA:  26 year old male complaining of rectal pain. EXAM: CT PELVIS WITH CONTRAST TECHNIQUE: Multidetector CT imaging of the pelvis was performed using the standard protocol following the bolus administration of intravenous contrast. CONTRAST:  1 ISOVUE-300 IOPAMIDOL (ISOVUE-300) INJECTION 61% COMPARISON:  CT dated 03/03/2017 FINDINGS: Urinary Tract: The visualized ureters and urinary bladder appear unremarkable. Bowel: There is mild thickened appearance of the rectal with multiple small perirectal lymph nodes. A 16 x 7 x 7 mm low attenuating collection containing fluid and small pockets of air along the course of the distal rectum anterior and inferior to the levator ani muscle (series 3, image 44 and sagittal series 6, image 77) is noted which is appears smaller compared to the prior CT an suspicious for residual abscess. This can be better evaluated with rectal contrast. There is no evidence of bowel obstruction. Normal appendix. Vascular/Lymphatic: No pathologically enlarged lymph nodes. No significant vascular abnormality seen. Reproductive: The visualized prostate and seminal vesicles are grossly unremarkable. Other:  None Musculoskeletal: No suspicious bone lesions identified. IMPRESSION: Mild thickened appearance of the rectal wall with a very small ill-defined perirectal collection suspicious for residual perirectal abscess. There has been interval decrease in the size of this perirectal collection compared to the prior CT. CT with rectal contrast may provide better evaluation if clinically indicated. Electronically Signed: By: Elgie Collard M.D. On: 03/05/2017 19:55   Portable Chest 1 View  Result Date: 03/05/2017 CLINICAL DATA:  26 y/o  M; cough. EXAM: PORTABLE CHEST 1 VIEW COMPARISON:  None. FINDINGS: The heart size and  mediastinal contours are within normal limits. Both lungs are clear. The visualized skeletal structures are unremarkable. IMPRESSION: No active disease. Electronically Signed   By: Mitzi Hansen M.D.   On: 03/05/2017 22:36   Scheduled Meds: . ampicillin-sulbactam (UNASYN) IV  3 g Intravenous Q6H  . guaiFENesin  600 mg Oral BID  . Influenza vac split quadrivalent PF  0.5 mL Intramuscular Tomorrow-1000  . nicotine  7 mg Transdermal Q0600  . sulfamethoxazole-trimethoprim  1 tablet Oral Daily   Continuous Infusions:   LOS: 1 day   Time Spent in minutes   30 minutes  Debbora PrestoMAGICK-German Manke M.D. on 03/07/2017 at 9:47 AM  Between 7am to 7pm - Pager - [phone]  After 7pm go to [email or web address] - password [email or web address]  And look for the night coverage person covering for me after hours  [facility] Office  [phone]

## 2017-03-07 NOTE — Discharge Summary (Signed)
Physician Discharge Summary  BRANDAN ROBICHEAUX ZOX:[id number] DOB: 20-May-1991 DOA: 03/05/2017  PCP: Acey Lav, MD  Admit date: 03/05/2017 Discharge date: 03/07/2017  Recommendations for Outpatient Follow-up:  1. Pt will need to follow up with PCP in 2-3 weeks post discharge 2. Please obtain BMP to evaluate electrolytes and kidney function 3. Please also check CBC to evaluate Hg and Hct levels 4. Augmentin for 5 days  5. Pt advised to stop taking ARV therapy until established care with ID clinic (this was recommendation of ID team inpatient)   Discharge Diagnoses:  Principal Problem:   Rectal fistula Active Problems:   H/O noncompliance with medical treatment - finincial, presenting hazards to health   AIDS (HCC)   Acute hypokalemia   Anemia   Leukopenia   Peri-rectal abscess  Discharge Condition: Stable  Diet recommendation: Heart healthy diet discussed in details       Brief Narrative:  26 y.o.malewith medical history significant of HIV/AIDS, bipolar disorder, history of prior anal intraepithelial neoplasia III, noncompliance, and recurrent rectal abscesses/discharge; who presents with complaints of rectal pain.He notes that symptoms have been ongoing over the last 2-3 weeks.Patient reports severe rectal pain that is worsened with any movement or coughing. Just seen in the ED 2days ago thought to have anal fissure and discharged home with clindamycin, nitroglycerin, and lidocaine. Patient reports being able to obtain the clindamycin and took itas advised. The other prescriptions for the nitroglycerin and lidocaine he was unable to obtain due to cost.  CT showed improvement. General surgery and ID consulted.  Assessment & Plan   Rectal fistula with abscess w/rectal pain - CT scan of abdomen and pelvis revealsdecreased size of rectal abscess and fistula.  - Blood cultures pending on discharge  - Currently afebrile, but with persistent leukopenia - Continue  antibiotic Augmenting on discharge per ID team recommendation  - General surgery consulted and appreciated, had no further recommendations   Leukopenia - WBC 2.5 with leftshift.Suspect secondary to above - Continue to monitor in an outpatient setting   Lactic acidosis - Now Resolved.   AIDS - Last CD4 count noted to be 20 on 09/20/2016.Sees Dr. Algis Liming of ID as outpatient. - Discontinued Prezcobix and Tivicay as patient has not been compliant and states these medications bother him at work - Infectious disease consulted and appreciated, recommended to stop ARV therapy until established in ID clinic   Hypokalemia - supplemented and WNL  History of noncompliance  - Patient reports intermittent use of HARRT medications and financial issues. - Counseled patient on need of compliance with medications   Normocytic Anemia - Hemoglobin 12.9, appears to be at baseline  H/O Bipolar disorder - Currently not on any medications. - mood stable and pleasant this AM   H/O alcohol and tobacco use - Patient only reports drinking 3 beers per week at this time.  DVT Prophylaxis  SCD's Code Status: Full Family Communication: None at bedside Disposition Plan: home as surgery team cleared for discharge and pt wants to go home   Consultants  Infectious disease  General surgery  Procedures   None   Procedures/Studies: Ct Pelvis W Contrast  Addendum Date: 03/05/2017   ADDENDUM REPORT: 03/05/2017 20:11 ADDENDUM: A second smaller collection containing small pockets of air noted more inferiorly likely in the soft tissues which appears to communicate to the more superior perirectal collection via a tract (best seen on sagittal series 6, image 76). Findings likely represent a perirectal abscess with fistulous communication to the  rectum and skin. Correlation with clinical exam recommended. Overall the size of this tract and collection appear smaller since the prior study. Electronically  Signed   By: Elgie Collard M.D.   On: 03/05/2017 20:11   Result Date: 03/05/2017 CLINICAL DATA:  26 year old male complaining of rectal pain. EXAM: CT PELVIS WITH CONTRAST TECHNIQUE: Multidetector CT imaging of the pelvis was performed using the standard protocol following the bolus administration of intravenous contrast. CONTRAST:  1 ISOVUE-300 IOPAMIDOL (ISOVUE-300) INJECTION 61% COMPARISON:  CT dated 03/03/2017 FINDINGS: Urinary Tract: The visualized ureters and urinary bladder appear unremarkable. Bowel: There is mild thickened appearance of the rectal with multiple small perirectal lymph nodes. A 16 x 7 x 7 mm low attenuating collection containing fluid and small pockets of air along the course of the distal rectum anterior and inferior to the levator ani muscle (series 3, image 44 and sagittal series 6, image 77) is noted which is appears smaller compared to the prior CT an suspicious for residual abscess. This can be better evaluated with rectal contrast. There is no evidence of bowel obstruction. Normal appendix. Vascular/Lymphatic: No pathologically enlarged lymph nodes. No significant vascular abnormality seen. Reproductive: The visualized prostate and seminal vesicles are grossly unremarkable. Other:  None Musculoskeletal: No suspicious bone lesions identified. IMPRESSION: Mild thickened appearance of the rectal wall with a very small ill-defined perirectal collection suspicious for residual perirectal abscess. There has been interval decrease in the size of this perirectal collection compared to the prior CT. CT with rectal contrast may provide better evaluation if clinically indicated. Electronically Signed: By: Elgie Collard M.D. On: 03/05/2017 19:55   Ct Abdomen Pelvis W Contrast  Result Date: 03/03/2017 CLINICAL DATA:  Rectal pain. Feels like a lump with bloody discharge. EXAM: CT ABDOMEN AND PELVIS WITH CONTRAST TECHNIQUE: Multidetector CT imaging of the abdomen and pelvis was performed  using the standard protocol following bolus administration of intravenous contrast. CONTRAST:  ISOVUE-300 IOPAMIDOL (ISOVUE-300) INJECTION 61% COMPARISON:  None. FINDINGS: Lower chest: No acute abnormality. Hepatobiliary: No focal liver abnormality is seen. No gallstones, gallbladder wall thickening, or biliary dilatation. Pancreas: Unremarkable. No pancreatic ductal dilatation or surrounding inflammatory changes. Spleen: Normal in size without focal abnormality. Adrenals/Urinary Tract: Adrenal glands are unremarkable. Kidneys are normal, without renal calculi, focal lesion, or hydronephrosis. Bladder is unremarkable. Stomach/Bowel: Stomach is within normal limits. Appendix appears normal. No evidence of bowel wall thickening, distention, or inflammatory changes. Vascular/Lymphatic: No significant vascular findings are present. No enlarged abdominal or pelvic lymph nodes. Reproductive: Prostate is unremarkable. Other: No abdominal wall hernia or abnormality. No abdominopelvic ascites. Musculoskeletal: No acute or significant osseous findings. IMPRESSION: 1. No acute abdominal or pelvic pathology. Electronically Signed   By: Elige Ko   On: 03/03/2017 09:35   Portable Chest 1 View  Result Date: 03/05/2017 CLINICAL DATA:  26 y/o  M; cough. EXAM: PORTABLE CHEST 1 VIEW COMPARISON:  None. FINDINGS: The heart size and mediastinal contours are within normal limits. Both lungs are clear. The visualized skeletal structures are unremarkable. IMPRESSION: No active disease. Electronically Signed   By: Mitzi Hansen M.D.   On: 03/05/2017 22:36      Discharge Exam: Vitals:   03/06/17 2211 03/07/17 0554  BP: 112/72 113/73  Pulse: 79 86  Resp: 17 17  Temp: 98.7 F (37.1 C) 99.7 F (37.6 C)   Vitals:   03/06/17 0528 03/06/17 1443 03/06/17 2211 03/07/17 0554  BP: 110/72 120/73 112/72 113/73  Pulse: 72 71 79 86  Resp: 18 15 17 17   Temp: 97.9 F (36.6 C) 98.3 F (36.8 C) 98.7 F (37.1 C)  99.7 F (37.6 C)  TempSrc: Oral Oral Oral   SpO2: 99% 100% 100% 100%    General: Pt is alert, follows commands appropriately, not in acute distress Cardiovascular: Regular rate and rhythm, S1/S2 +, no murmurs, no rubs, no gallops Respiratory: Clear to auscultation bilaterally, no wheezing, no crackles, no rhonchi Abdominal: Soft, non tender, non distended, bowel sounds +, no guarding  Discharge Instructions   Allergies as of 03/07/2017   No Known Allergies     Medication List    STOP taking these medications   clindamycin 300 MG capsule Commonly known as:  CLEOCIN   darunavir-cobicistat 800-150 MG tablet Commonly known as:  PREZCOBIX   dicyclomine 20 MG tablet Commonly known as:  BENTYL   dolutegravir 50 MG tablet Commonly known as:  TIVICAY   emtricitabine-tenofovir AF 200-25 MG tablet Commonly known as:  DESCOVY   feeding supplement Liqd   fluconazole 200 MG tablet Commonly known as:  DIFLUCAN   lidocaine 5 % ointment Commonly known as:  XYLOCAINE   naproxen sodium 220 MG tablet Commonly known as:  ANAPROX   Nitroglycerin 0.4 % Oint   ondansetron 4 MG tablet Commonly known as:  ZOFRAN     TAKE these medications   acetaminophen 325 MG tablet Commonly known as:  TYLENOL Take 2 tablets (650 mg total) by mouth every 6 (six) hours as needed.   amoxicillin-clavulanate 875-125 MG tablet Commonly known as:  AUGMENTIN Take 1 tablet by mouth 2 (two) times daily.   oxyCODONE 5 MG immediate release tablet Commonly known as:  Oxy IR/ROXICODONE Take 1 tablet (5 mg total) by mouth every 4 (four) hours as needed for moderate pain.   sulfamethoxazole-trimethoprim 800-160 MG tablet Commonly known as:  BACTRIM DS,SEPTRA DS Take 1 tablet by mouth daily. What changed:  when to take this       Follow-up Information    GROSS,STEVEN C., MD. Schedule an appointment as soon as possible for a visit in 1 week(s).   Specialty:  General Surgery Why:  for  follow Contact information: [address] [name] [address] [phone]        Acey Lav, MD Follow up.   Specialty:  Infectious Diseases Contact information: [address] [name] [address] [phone]            The results of significant diagnostics from this hospitalization (including imaging, microbiology, ancillary and laboratory) are listed below for reference.     Microbiology: Recent Results (from the past 240 hour(s))  Blood culture (routine x 2)     Status: None (Preliminary result)   Collection Time: 03/05/17  8:35 PM  Result Value Ref Range Status   Specimen Description BLOOD RIGHT HAND  Final   Special Requests BOTTLES DRAWN AEROBIC ONLY 5CC  Final   Culture NO GROWTH 2 DAYS  Final   Report Status PENDING  Incomplete  Culture, blood (Routine X 2) w Reflex to ID Panel     Status: None (Preliminary result)   Collection Time: 03/06/17 12:55 AM  Result Value Ref Range Status   Specimen Description BLOOD LEFT ARM  Final   Special Requests BOTTLES DRAWN AEROBIC AND ANAEROBIC 5CC  Final   Culture NO GROWTH 1 DAY  Final   Report Status PENDING  Incomplete     Labs: Basic Metabolic Panel:  Recent Labs Lab 03/03/17  0801 03/05/17 1824 03/06/17 0055 03/07/17 0515  NA 141 139 137 140  K 3.3* 3.5 3.2* 3.7  CL 106 106 106 104  CO2 24 24 25 29   GLUCOSE 69 72 144* 77  BUN 8 7 5* 5*  CREATININE 0.71 0.71 0.67 0.95  CALCIUM 8.5* 8.4* 8.0* 8.9   Liver Function Tests:  Recent Labs Lab 03/06/17 0055  AST 68*  ALT 33  ALKPHOS 191*  BILITOT 0.7  PROT 7.9  ALBUMIN 3.0*   CBC:  Recent Labs Lab 03/03/17 0801 03/05/17 1824 03/06/17 0055 03/07/17 0515  WBC 3.0* 2.5* 2.3* 2.4*  NEUTROABS 1.6* 1.0*  --   --   HGB 12.9* 12.7* 12.3* 12.9*  HCT 37.9* 36.3* 36.3* 38.6*  MCV 91.8 91.4 91.4 91.9  PLT 154 170 168 200    SIGNED: Time coordinating discharge: 30 minutes  MAGICK-Jadriel Saxer, MD  [facility] 03/07/2017, 10:47 AM Pager [phone]  If 7PM-7AM, please contact night-coverage [email or web address] Password [email or web address]

## 2017-03-08 LAB — HEPATITIS B SURFACE ANTIGEN

## 2017-03-08 LAB — HEPATITIS B E ANTIGEN: Hep B E Ag: POSITIVE — AB

## 2017-03-08 LAB — HEPATITIS B SURFACE ANTIBODY,QUALITATIVE: HEP B S AB: NONREACTIVE

## 2017-03-08 LAB — HEPATITIS B SURFACE AG, CONFIRM: HBsAg Confirmation: POSITIVE — AB

## 2017-03-10 LAB — CULTURE, BLOOD (ROUTINE X 2): Culture: NO GROWTH

## 2017-03-11 LAB — CULTURE, BLOOD (ROUTINE X 2): Culture: NO GROWTH

## 2017-03-19 LAB — NGI HBV SUPERQUANT

## 2017-03-19 LAB — NGI HBV ENDPOINT DILUTION

## 2017-03-21 LAB — MISC LABCORP TEST (SEND OUT): Labcorp test code: 551722

## 2017-03-27 ENCOUNTER — Inpatient Hospital Stay: Payer: Self-pay | Admitting: Internal Medicine

## 2017-04-18 ENCOUNTER — Encounter: Payer: Self-pay | Admitting: Infectious Disease

## 2017-04-18 ENCOUNTER — Ambulatory Visit (INDEPENDENT_AMBULATORY_CARE_PROVIDER_SITE_OTHER): Payer: No Typology Code available for payment source | Admitting: Infectious Disease

## 2017-04-18 ENCOUNTER — Other Ambulatory Visit (HOSPITAL_COMMUNITY)
Admission: RE | Admit: 2017-04-18 | Discharge: 2017-04-18 | Disposition: A | Discharge status: Home / Self Care | Payer: No Typology Code available for payment source | Source: Ambulatory Visit | Attending: Infectious Disease | Admitting: Infectious Disease

## 2017-04-18 VITALS — BP 123/78 | HR 108 | Temp 97.6°F | Wt 148.0 lb

## 2017-04-18 DIAGNOSIS — F3170 Bipolar disorder, currently in remission, most recent episode unspecified: Secondary | ICD-10-CM

## 2017-04-18 DIAGNOSIS — D013 Carcinoma in situ of anus and anal canal: Secondary | ICD-10-CM | POA: Diagnosis not present

## 2017-04-18 DIAGNOSIS — B20 Human immunodeficiency virus [HIV] disease: Secondary | ICD-10-CM

## 2017-04-18 LAB — CBC WITH DIFFERENTIAL/PLATELET
BASOS PCT: 0 %
Basophils Absolute: 0 cells/uL (ref 0–200)
Eosinophils Absolute: 150 cells/uL (ref 15–500)
Eosinophils Relative: 6 %
HCT: 35.5 % — ABNORMAL LOW (ref 38.5–50.0)
Hemoglobin: 12 g/dL — ABNORMAL LOW (ref 13.2–17.1)
LYMPHS PCT: 13 %
Lymphs Abs: 325 cells/uL — ABNORMAL LOW (ref 850–3900)
MCH: 30.2 pg (ref 27.0–33.0)
MCHC: 33.8 g/dL (ref 32.0–36.0)
MCV: 89.2 fL (ref 80.0–100.0)
MONOS PCT: 13 %
MPV: 10.3 fL (ref 7.5–12.5)
Monocytes Absolute: 325 cells/uL (ref 200–950)
Neutro Abs: 1700 cells/uL (ref 1500–7800)
Neutrophils Relative %: 68 %
PLATELETS: 238 10*3/uL (ref 140–400)
RBC: 3.98 MIL/uL — AB (ref 4.20–5.80)
RDW: 13.6 % (ref 11.0–15.0)
WBC: 2.5 10*3/uL — AB (ref 3.8–10.8)

## 2017-04-18 LAB — CMP 10231
AG RATIO: 0.6 ratio — AB (ref 1.0–2.5)
ALBUMIN: 3.3 g/dL — AB (ref 3.6–5.1)
ALK PHOS: 151 U/L — AB (ref 40–115)
ALT: 24 U/L (ref 9–46)
AST: 53 U/L — ABNORMAL HIGH (ref 10–40)
BILIRUBIN TOTAL: 0.7 mg/dL (ref 0.2–1.2)
BUN/Creatinine Ratio: 10.3 Ratio (ref 6–22)
BUN: 10 mg/dL (ref 7–25)
CALCIUM: 8.6 mg/dL (ref 8.6–10.3)
CO2: 26 mmol/L (ref 20–31)
CREATININE: 0.97 mg/dL (ref 0.60–1.35)
Chloride: 104 mmol/L (ref 98–110)
GFR, Est African American: >89 mL/min (ref >=60)
GFR, Est Non African American: >89 mL/min (ref >=60)
GLOBULIN: 5.1 g/dL — AB (ref 1.9–3.7)
Glucose, Bld: 62 mg/dL — ABNORMAL LOW (ref 65–99)
Potassium: 3.4 mmol/L — ABNORMAL LOW (ref 3.5–5.3)
Sodium: 139 mmol/L (ref 135–146)
TOTAL PROTEIN: 8.4 g/dL — AB (ref 6.1–8.1)

## 2017-04-18 LAB — COMPLETE METABOLIC PANEL WITH GFR
AG RATIO: 0.6 ratio — AB (ref 1.0–2.5)
ALT: 24 U/L (ref 9–46)
AST: 53 U/L — ABNORMAL HIGH (ref 10–40)
Albumin: 3.3 g/dL — ABNORMAL LOW (ref 3.6–5.1)
Alkaline Phosphatase: 151 U/L — ABNORMAL HIGH (ref 40–115)
BILIRUBIN TOTAL: 0.7 mg/dL (ref 0.2–1.2)
BUN / CREAT RATIO: 10.3 ratio (ref 6–22)
BUN: 10 mg/dL (ref 7–25)
CO2: 26 mmol/L (ref 20–31)
Calcium: 8.6 mg/dL (ref 8.6–10.3)
Chloride: 104 mmol/L (ref 98–110)
Creat: 0.97 mg/dL (ref 0.60–1.35)
GFR, Est Non African American: >89 mL/min (ref >=60)
GLOBULIN: 5.1 g/dL — AB (ref 1.9–3.7)
Glucose, Bld: 62 mg/dL — ABNORMAL LOW (ref 65–99)
Potassium: 3.4 mmol/L — ABNORMAL LOW (ref 3.5–5.3)
Sodium: 139 mmol/L (ref 135–146)
Total Protein: 8.4 g/dL — ABNORMAL HIGH (ref 6.1–8.1)

## 2017-04-18 MED ORDER — DOLUTEGRAVIR SODIUM 50 MG PO TABS: 50.0000 mg | ORAL_TABLET | Freq: Every day | ORAL | 6 refills | Status: DC

## 2017-04-18 MED ORDER — SULFAMETHOXAZOLE-TRIMETHOPRIM 400-80 MG PO TABS: 1.0000 | ORAL_TABLET | Freq: Every day | ORAL | 11 refills | Status: DC

## 2017-04-18 MED ORDER — EMTRICITABINE-TENOFOVIR AF 200-25 MG PO TABS: 1.0000 | ORAL_TABLET | Freq: Every day | ORAL | 3 refills | Status: DC

## 2017-04-18 MED ORDER — DOLUTEGRAVIR-RILPIVIRINE 50-25 MG PO TABS: 1.0000 | ORAL_TABLET | Freq: Every day | ORAL | 3 refills | Status: DC

## 2017-04-18 MED ORDER — EMTRICITAB-RILPIVIR-TENOFOV AF 200-25-25 MG PO TABS: 1.0000 | ORAL_TABLET | Freq: Every day | ORAL | 6 refills | Status: DC

## 2017-04-18 MED FILL — SULFAMETHOXAZOLE/TMP SS TAB: 400-80 | 30 days supply | Qty: 30 | Fill #0

## 2017-04-18 MED FILL — ODEFSEY 200-25-25 MG TABS: 200-25-25 | 30 days supply | Qty: 30 | Fill #0

## 2017-04-18 MED FILL — TIVICAY 50 MG TABLET: 50 | 30 days supply | Qty: 30 | Fill #0

## 2017-04-18 NOTE — Progress Notes (Signed)
HPI: Justin Ball is a 26 y.o. male who is here for his HIV visit.   Allergies: No Known Allergies  Vitals: Temp: 97.6 F (36.4 C) (04/25 1516) Temp Source: Oral (04/25 1516) BP: 123/78 (04/25 1516) Pulse Rate: 108 (04/25 1516)  Past Medical History: Past Medical History:  Diagnosis Date  . Anxiety   . Bipolar 1 disorder (HCC)   . Depression   . HIV infection (HCC)   . Neuromuscular disorder (HCC)   . Scabies   . 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 Substance abuse   . Syphilis     Social History: Social History   Social History  . Marital status: Single    Spouse name: N/A  . Number of children: N/A  . Years of education: N/A   Social History Main Topics  . Smoking status: Former Smoker    Packs/day: 0.25    Years: 5.00    Types: Cigarettes    Quit date: 08/12/2016  . Smokeless tobacco: Never Used  . Alcohol use 0.0 oz/week     Comment: occasionally   . Drug use: No  . Sexual activity: Yes    Partners: Male    Birth control/ protection: Condom     Comment: irregular condom use; educated   Other Topics Concern  . None   Social History Narrative  . None    Previous Regimen: DRV/r/TRV, DTG/TRV  Current Regimen: None  Labs: HIV 1 RNA Quant (copies/mL)  Date Value  04/28/2015 39,128 (H)  01/19/2015 1,029 (H)  11/04/2013 605 (H)   CD4 T Cell Abs (/uL)  Date Value  09/12/2016 20 (L)  04/28/2015 90 (L)  01/19/2015 40 (L)   Hep B S Ab (no units)  Date Value  03/07/2017 Non Reactive   Hepatitis B Surface Ag (no units)  Date Value  03/07/2017 Confirm. indicated   HCV Ab (no units)  Date Value  10/10/2012 NEGATIVE    CrCl: CrCl cannot be calculated (Patient's most recent lab result is older than the maximum 21 days allowed.).  Lipids:    Component Value Date/Time   CHOL 125 09/28/2016 1717   TRIG 122 09/28/2016 1717   HDL 31 (L) 09/28/2016 1717   CHOLHDL 4.0 09/28/2016 1717   VLDL 24 09/28/2016 1717   LDLCALC 70 09/28/2016 1717     Assessment: Carolyn has not seen [place] for a while. He has a very poor hx of adherence. We spent a good amount of time stressing to him the importance of taking meds at this visit. He currently works for [employer] and has insurance through them. 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 We are trying to make is something simple for him. We thought about using Juluca + Descovy but his insurance would not cover it yet. Therefore, he is going to be on Aneta and Tivicay instead. He is going to pick today at [facility] long. We activated his copay card so he has no copay. He is also getting bactrim SS today for PCP prophylaxis too. Made him a calendar. He said that he is going to take all three pills at 7 pm with food.    Recommendations:  Start Odefsey 1 PO qday with food Start Tivicay  PO qday with food Start Septra SS 1 PO qday with food F/u with me in 2 wks  [name], PharmD, BCPS, AAHIVP, CPP Clinical Infectious Disease Pharmacist [facility] 04/18/2017, 4:24 PM

## 2017-04-18 NOTE — Progress Notes (Signed)
Subjective:   Chief complaint: re-establish care for HIV/AIDS    Patient ID: Justin Ball, male    DOB: November 07, 1991, 26 y.o.   MRN: [id number]  HPI  Justin Ball returned to clinic to see me today. He is fully employed at [employer] and has insurance.   He is ready to start medications. This is the MOST put together I have ever seen him and the most poised.   We had extensive discussion re the risks of him dying if he continues to NOT reliably be on ARV and to have AIDS with such a low CD4 count but that conversely he can get control of his virus in next 1-2 months, keep control and reconstitute his immune system.  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 In the past he has had trouble tolerating DRV based regimens due to nausea and vomiting. We proposed a regimen of DTG, RPV and F-TAF with food with 3 drugs and a low side effects.  Past Medical History:  Diagnosis Date  . Anxiety   . Bipolar 1 disorder (HCC)   . Depression   . HIV infection (HCC)   . Neuromuscular disorder (HCC)   . Scabies   . 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 Substance abuse   . Syphilis     Past Surgical History:  Procedure Laterality Date  . EXAMINATION UNDER ANESTHESIA N/A 12/28/2014   Procedure: EXAM UNDER ANESTHESIA;  Surgeon: Karie Soda, MD;  Location: [facility];  Service: General;  Laterality: N/A;  . FLOOR OF MOUTH BIOPSY N/A 09/16/2016   Procedure: BIOPSY OF ORAL ABCESS;  Surgeon: Newman Pies, MD;  Location: [facility] OR;  Service: ENT;  Laterality: N/A;  . INCISION AND DRAINAGE ABSCESS N/A 09/16/2016   Procedure: INCISION AND DRAINAGE ABSCESS;  Surgeon: Newman Pies, MD;  Location: [facility] OR;  Service: ENT;  Laterality: N/A;  . INCISION AND DRAINAGE PERIRECTAL ABSCESS  08/27/2010   Dr Carolynne Edouard  . INCISION AND DRAINAGE PERIRECTAL ABSCESS N/A 12/28/2014   Procedure: IRRIGATION AND DEBRIDEMENT PERIRECTAL ABSCESS;  Surgeon: Karie Soda, MD;  Location: [facility];  Service: General;  Laterality: N/A;  Fistula repair and ablation    Family  History  Problem Relation Age of Onset  . Hypertension Other       Social History   Social History  . Marital status: Single    Spouse name: N/A  . Number of children: N/A  . Years of education: N/A   Social History Main Topics  . Smoking status: Former Smoker    Packs/day: 0.25    Years: 5.00    Types: Cigarettes    Quit date: 08/12/2016  . Smokeless tobacco: Never Used  . Alcohol use 0.0 oz/week     Comment: occasionally   . Drug use: No  . Sexual activity: Yes    Partners: Male    Birth control/ protection: Condom     Comment: irregular condom use; educated   Other Topics Concern  . None   Social History Narrative  . None    No Known Allergies   Current Outpatient Prescriptions:  .  acetaminophen (TYLENOL) 325 MG tablet, Take 2 tablets (650 mg total) by mouth every 6 (six) hours as needed., Disp: 15 tablet, Rfl: 0 .  amoxicillin-clavulanate (AUGMENTIN) 875-125 MG tablet, Take 1 tablet by mouth 2 (two) times daily., Disp: 10 tablet, Rfl: 0 .  dolutegravir (TIVICAY) 50 MG tablet, Take 1 tablet (50 mg total) by mouth daily., Disp: 30 tablet, Rfl: 6 .  emtricitabine-rilpivir-tenofovir AF (ODEFSEY) 200-25-25 MG TABS tablet, Take 1 tablet by mouth daily with breakfast., Disp: 30 tablet, Rfl: 6 .  oxyCODONE (OXY IR/ROXICODONE) 5 MG immediate release tablet, Take 1 tablet (5 mg total) by mouth every 4 (four) hours as needed for moderate pain. (Patient not taking: Reported on 04/18/2017), Disp: 15 tablet, Rfl: 0 .  sulfamethoxazole-trimethoprim (BACTRIM) 400-80 MG tablet, Take 1 tablet by mouth daily., Disp: 30 tablet, Rfl: 11     Review of Systems      Objective:   Physical Exam  Constitutional: He is oriented to person, place, and time. He appears well-developed and well-nourished. No distress.  HENT:  Head: Normocephalic and atraumatic.  Mouth/Throat: No oropharyngeal exudate.  Eyes: Conjunctivae and EOM are normal. No scleral icterus.  Neck: Normal range of  motion. Neck supple.  Cardiovascular: Normal rate and regular rhythm.   Pulmonary/Chest: Effort normal. No respiratory distress. He has no wheezes.  Abdominal: He exhibits no distension.  Musculoskeletal: He exhibits no edema or tenderness.  Neurological: He is alert and oriented to person, place, and time. He exhibits normal muscle tone. Coordination normal.  Skin: Skin is warm and dry. No rash noted. He is not diaphoretic. No erythema. No pallor.  Psychiatric: He has a normal mood and affect. His behavior is normal. Judgment and thought content normal.          Assessment & Plan:   HIV/AIDS: due to constraints of insurance we will go with TIVICAY and ODEFSEY (vs JULUCA and DEscovy)  Check labs today and see Justin Ball in 2 weeks and check labs in next 4 weeks  SS bactrim daily for PCP prevention  Major depression that at times has been severe: we will engage him with counselors but right now his focus is missing as little time from work as possible so we will focus on optmiizing No1 first. Consider adding back an SSRI in future  I spent greater than 40 minutes with the patient including greater than 50% of time in face to face counsel of the patient re his HIV/IAIDS, new regimen, weight loss, depression and in coordination of his care.

## 2017-04-19 LAB — T-HELPER CELL (CD4) - (RCID CLINIC ONLY)
CD4 % Helper T Cell: 2 % — ABNORMAL LOW (ref 33–55)
CD4 T CELL ABS: 10 /uL — AB (ref 400–2700)

## 2017-04-19 LAB — RPR

## 2017-04-20 LAB — URINE CYTOLOGY ANCILLARY ONLY
CHLAMYDIA, DNA PROBE: NEGATIVE
Neisseria Gonorrhea: NEGATIVE

## 2017-04-23 LAB — HIV RNA, RTPCR W/R GT (RTI, PI,INT)
HIV-1 RNA, QN PCR: 5.23 {Log_copies}/mL — AB
HIV-1 RNA, QN PCR: 169000 copies/mL — ABNORMAL HIGH

## 2017-04-25 LAB — HLA B*5701: HLA-B 5701 W/RFLX HLA-B HIGH: NEGATIVE

## 2017-04-28 LAB — RFLX HIV-1 INTEGRASE GENOTYPE

## 2017-04-28 LAB — HIV-1 GENOTYPE: HIV-1 Genotype: DETECTED — AB

## 2017-05-07 ENCOUNTER — Ambulatory Visit: Payer: No Typology Code available for payment source

## 2017-05-08 ENCOUNTER — Ambulatory Visit (INDEPENDENT_AMBULATORY_CARE_PROVIDER_SITE_OTHER)
Payer: No Typology Code available for payment source | Admitting: Pharmacist Clinician (PhC)/ Clinical Pharmacy Specialist

## 2017-05-08 DIAGNOSIS — B20 Human immunodeficiency virus [HIV] disease: Secondary | ICD-10-CM | POA: Diagnosis not present

## 2017-05-08 NOTE — Patient Instructions (Addendum)
Continue Odefsey 1 tablet daily with a meal Continue Tivicay 1 tablet daily with a meal Continue Septra 1 tablet daily with a meal Come back and see me on June 13 at 2 PM

## 2017-05-08 NOTE — Progress Notes (Signed)
HPI: Justin Ball is a 26 y.o. male who is here for f/u with me after restarting his ART.   Allergies: No Known Allergies  Vitals:    Past Medical History: Past Medical History:  Diagnosis Date  . Anxiety   . Bipolar 1 disorder (HCC)   . Depression   . HIV infection (HCC)   . Neuromuscular disorder (HCC)   . Scabies   . 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 Substance abuse   . Syphilis     Social History: Social History   Social History  . Marital status: Single    Spouse name: N/A  . Number of children: N/A  . Years of education: N/A   Social History Main Topics  . Smoking status: Former Smoker    Packs/day: 0.25    Years: 5.00    Types: Cigarettes    Quit date: 08/12/2016  . Smokeless tobacco: Never Used  . Alcohol use 0.0 oz/week     Comment: occasionally   . Drug use: No  . Sexual activity: Yes    Partners: Male    Birth control/ protection: Condom     Comment: irregular condom use; educated   Other Topics Concern  . Not on file   Social History Narrative  . No narrative on file    Previous Regimen: DRV/r/TRV/ DTG/TRV  Current Regimen: Odefsey/DTG  Labs: HIV 1 RNA Quant (copies/mL)  Date Value  04/28/2015 39,128 (H)  01/19/2015 1,029 (H)  11/04/2013 605 (H)   CD4 T Cell Abs (/uL)  Date Value  04/18/2017 10 (L)  09/12/2016 20 (L)  04/28/2015 90 (L)   Hep B S Ab (no units)  Date Value  03/07/2017 Non Reactive   Hepatitis B Surface Ag (no units)  Date Value  03/07/2017 Confirm. indicated   HCV Ab (no units)  Date Value  10/10/2012 NEGATIVE    CrCl: CrCl cannot be calculated (Unknown ideal weight.).  Lipids:    Component Value Date/Time   CHOL 125 09/28/2016 1717   TRIG 122 09/28/2016 1717   HDL 31 (L) 09/28/2016 1717   CHOLHDL 4.0 09/28/2016 1717   VLDL 24 09/28/2016 1717   LDLCALC 70 09/28/2016 1717    Assessment: [name] has started on his Odefsey/DTG about 2 wks ago. He has had no side effects so far other than a  headache and some nausea yesterday. This is likely unrelated to the drug itself. He has not missed any doses so far. I showed him both the VL and the CD4 and explained to him of how severe they are. He wants to gain more weight. I told him that the only good way for that is to take his meds to get HIV under control. I think this will be his motivation to take meds.   He takes both ART with a meal in the morning and his septra daily. He said that sometimes working a double shift will make him tire and if he should continue to do it. Advised him that it would be his call to slow down until he gets better. He is going to come back and see me in 4 wks so we can do his labs before the visit with Dr. Daiva EvesVan Dam.    Recommendations:  Cont Odefsey 1 PO qday with meal Cont Tivicay 50mg  PO qday with meal Cont Septra 1 PO qday  Ulyses SouthwardMinh Wilburt Messina, PharmD, BCPS, AAHIVP, CPP Clinical Infectious Disease Pharmacist [facility] 05/08/2017, 2:00 PM

## 2017-05-18 ENCOUNTER — Emergency Department (HOSPITAL_COMMUNITY): Payer: No Typology Code available for payment source

## 2017-05-18 ENCOUNTER — Encounter (HOSPITAL_COMMUNITY): Payer: Self-pay | Admitting: Emergency Medicine

## 2017-05-18 ENCOUNTER — Emergency Department (HOSPITAL_COMMUNITY)
Admission: EM | Admit: 2017-05-18 | Discharge: 2017-05-19 | Disposition: A | Discharge status: Home / Self Care | Payer: No Typology Code available for payment source | Attending: Emergency Medicine | Admitting: Emergency Medicine

## 2017-05-18 DIAGNOSIS — R51 Headache: Secondary | ICD-10-CM | POA: Diagnosis present

## 2017-05-18 DIAGNOSIS — B20 Human immunodeficiency virus [HIV] disease: Secondary | ICD-10-CM | POA: Insufficient documentation

## 2017-05-18 DIAGNOSIS — F17210 Nicotine dependence, cigarettes, uncomplicated: Secondary | ICD-10-CM | POA: Diagnosis not present

## 2017-05-18 DIAGNOSIS — E876 Hypokalemia: Secondary | ICD-10-CM | POA: Diagnosis not present

## 2017-05-18 DIAGNOSIS — R519 Headache, unspecified: Secondary | ICD-10-CM

## 2017-05-18 LAB — COMPREHENSIVE METABOLIC PANEL
ALT: 23 U/L (ref 17–63)
AST: 31 U/L (ref 15–41)
Albumin: 3 g/dL — ABNORMAL LOW (ref 3.5–5.0)
Alkaline Phosphatase: 137 U/L — ABNORMAL HIGH (ref 38–126)
Anion gap: 8 (ref 5–15)
BILIRUBIN TOTAL: 0.7 mg/dL (ref 0.3–1.2)
BUN: 14 mg/dL (ref 6–20)
CO2: 27 mmol/L (ref 22–32)
Calcium: 9.4 mg/dL (ref 8.9–10.3)
Chloride: 105 mmol/L (ref 101–111)
Creatinine, Ser: 0.71 mg/dL (ref 0.61–1.24)
GLUCOSE: 108 mg/dL — AB (ref 65–99)
POTASSIUM: 3 mmol/L — AB (ref 3.5–5.1)
Sodium: 140 mmol/L (ref 135–145)
TOTAL PROTEIN: 8.8 g/dL — AB (ref 6.5–8.1)

## 2017-05-18 LAB — URINALYSIS, ROUTINE W REFLEX MICROSCOPIC
GLUCOSE, UA: NEGATIVE mg/dL
Hgb urine dipstick: NEGATIVE
Ketones, ur: NEGATIVE mg/dL
Leukocytes, UA: NEGATIVE
Nitrite: NEGATIVE
PROTEIN: 100 mg/dL — AB
Specific Gravity, Urine: 1.028 (ref 1.005–1.030)
Squamous Epithelial / LPF: NONE SEEN
pH: 6 (ref 5.0–8.0)

## 2017-05-18 LAB — CBC
HCT: 33.6 % — ABNORMAL LOW (ref 39.0–52.0)
HEMOGLOBIN: 11.6 g/dL — AB (ref 13.0–17.0)
MCH: 30.8 pg (ref 26.0–34.0)
MCHC: 34.5 g/dL (ref 30.0–36.0)
MCV: 89.1 fL (ref 78.0–100.0)
Platelets: 345 10*3/uL (ref 150–400)
RBC: 3.77 MIL/uL — AB (ref 4.22–5.81)
RDW: 13.2 % (ref 11.5–15.5)
WBC: 3.6 10*3/uL — ABNORMAL LOW (ref 4.0–10.5)

## 2017-05-18 LAB — LIPASE, BLOOD: Lipase: 16 U/L (ref 11–51)

## 2017-05-18 MED ORDER — SODIUM CHLORIDE 0.9 % IV BOLUS (SEPSIS)
1000.0000 mL | Freq: Once | INTRAVENOUS | Status: AC
  Administered 2017-05-18: 1000 mL via INTRAVENOUS

## 2017-05-18 MED ORDER — DIPHENHYDRAMINE HCL 50 MG/ML IJ SOLN
25.0000 mg | Freq: Once | INTRAMUSCULAR | Status: AC
  Administered 2017-05-18: 25 mg via INTRAVENOUS
  Filled 2017-05-18: qty 1

## 2017-05-18 MED ORDER — POTASSIUM CHLORIDE CRYS ER 20 MEQ PO TBCR
40.0000 meq | EXTENDED_RELEASE_TABLET | Freq: Once | ORAL | Status: AC
  Administered 2017-05-19: 40 meq via ORAL
  Filled 2017-05-18: qty 2

## 2017-05-18 MED ORDER — METOCLOPRAMIDE HCL 5 MG/ML IJ SOLN
10.0000 mg | Freq: Once | INTRAMUSCULAR | Status: AC
  Administered 2017-05-18: 10 mg via INTRAVENOUS
  Filled 2017-05-18: qty 2

## 2017-05-18 MED ORDER — ONDANSETRON 4 MG PO TBDP
4.0000 mg | ORAL_TABLET | Freq: Once | ORAL | Status: AC | PRN
  Administered 2017-05-18: 4 mg via ORAL
  Filled 2017-05-18: qty 1

## 2017-05-18 NOTE — Discharge Instructions (Signed)
Please follow up with your Primary care provider regarding today's visit. Take tylenol or ibuprofen as needed for pain relief. Eat plenty of greens and bananas as well as pedialyte to help improve electrolytes.   Get help right away if: Your headache becomes severe. You have repeated vomiting. You have a stiff neck. You have a loss of vision. You have problems with speech. You have pain in the eye or ear. You have muscular weakness or loss of muscle control. You lose your balance or have trouble walking. You feel faint or pass out. You have confusion.

## 2017-05-18 NOTE — ED Provider Notes (Signed)
[facility]-EMERGENCY DEPT Provider Note   CSN: [id number] Arrival date & time: 05/18/17  1842     History   Chief Complaint Chief Complaint  Patient presents with  . Weakness  . Emesis  . Headache    HPI Justin Ball is a 26 y.o. male with PMHx of bipolar 1 disorder, HIV, syphilis, substance abuse, AIDS presents today with complaints of "bad headache" x 3 days. He reports the pain as intermittent, unchanged, 8 or 9/10 pain. He reports it is located to his forehead. He reports this is new and does not normally have headaches. He reports associated vomiting that has improved.  He reports taking OTC pain medication with no relief. He states lying down makes his symptoms better. He denies visual changes, double vision, blurry vision, photophobia. He denies neck pain or neck stiffness. He denies fevers, chills, diarrhea. He states the pain affects his walking. He states he takes his daily medications as prescribed sees his physicians regularly.  He also states he has had a cough for several weeks that has been unchanged.   Per EMR: recently seen on 05/08/17 for AIDS, recently started on Odefsey/DTG about 1 month ago. Seen by Dr [name] on 04/18/17 to re-establish care for HIV/AIDS.  CD4 count on 04/18/17 : 10/uL with viral count detectable.   The history is provided by the patient. No language interpreter was used.    Past Medical History:  Diagnosis Date  . Anxiety   . Bipolar 1 disorder (HCC)   . Depression   . HIV infection (HCC)   . Neuromuscular disorder (HCC)   . Scabies   . 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 Substance abuse   . Syphilis     Patient Active Problem List   Diagnosis Date Noted  . Leukopenia 03/06/2017  . Peri-rectal abscess 03/06/2017  . 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 Ulcer of soft palate 09/12/2016  . Acute hypokalemia 09/12/2016  . Anemia 09/12/2016  . Alcohol use 09/12/2016  . Oral abscess 09/12/2016  . Abscess   . AIDS (HCC) 01/19/2015  . 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 H/O noncompliance with medical treatment - finincial, presenting hazards to health 12/28/2014  . Rectal abscess 12/28/2014  . Intersphincteric abscess 12/28/2014  . Bipolar disorder (HCC) 04/08/2013  . Muscle ache 01/06/2013  . Lower back pain 01/06/2013  . Insomnia 01/06/2013  . HIV (human immunodeficiency virus infection) (HCC) 10/10/2012  . Rash and nonspecific skin eruption 10/10/2012  . Rectal discharge 10/10/2012  . Syphilis 10/10/2012    Past Surgical History:  Procedure Laterality Date  . EXAMINATION UNDER ANESTHESIA N/A 12/28/2014   Procedure: EXAM UNDER ANESTHESIA;  Surgeon: Karie Soda, MD;  Location: [facility];  Service: General;  Laterality: N/A;  . FLOOR OF MOUTH BIOPSY N/A 09/16/2016   Procedure: BIOPSY OF ORAL ABCESS;  Surgeon: Newman Pies, MD;  Location: [facility] OR;  Service: ENT;  Laterality: N/A;  . INCISION AND DRAINAGE ABSCESS N/A 09/16/2016   Procedure: INCISION AND DRAINAGE ABSCESS;  Surgeon: Newman Pies, MD;  Location: [facility] OR;  Service: ENT;  Laterality: N/A;  . INCISION AND DRAINAGE PERIRECTAL ABSCESS  08/27/2010   Dr Carolynne Edouard  . INCISION AND DRAINAGE PERIRECTAL ABSCESS N/A 12/28/2014   Procedure: IRRIGATION AND DEBRIDEMENT PERIRECTAL ABSCESS;  Surgeon: Karie Soda, MD;  Location: [facility];  Service: General;  Laterality: N/A;  Fistula repair and ablation       Home Medications    Prior to Admission medications  Medication Sig Start Date End Date Taking? Authorizing Provider  dolutegravir (TIVICAY) 50 MG tablet Take 1 tablet (50 mg total) by mouth daily. 04/18/17  Yes Randall Hiss, MD  emtricitabine-rilpivir-tenofovir AF (ODEFSEY) 200-25-25 MG TABS tablet Take 1 tablet by mouth daily with breakfast. 04/18/17  Yes [name], Lisette Grinder, MD  naproxen sodium (ANAPROX) 220 MG tablet Take 220 mg by mouth 2 (two) times daily as needed (pain).   Yes [provider]  sulfamethoxazole-trimethoprim (BACTRIM) 400-80 MG tablet  Take 1 tablet by mouth daily. 04/18/17  Yes [name], Lisette Grinder, MD    Family History Family History  Problem Relation Age of Onset  . Hypertension Other     Social History Social History  Substance Use Topics  . Smoking status: Current Every Day Smoker    Packs/day: 0.25    Years: 5.00    Types: Cigarettes  . Smokeless tobacco: Never Used  . Alcohol use 0.0 oz/week     Comment: occasionally      Allergies   Patient has no known allergies.   Review of Systems Review of Systems  Constitutional: Negative for chills and fever.  Eyes: Negative for photophobia and visual disturbance.  Respiratory: Negative for shortness of breath.   Cardiovascular: Negative for chest pain.  Gastrointestinal: Positive for nausea and vomiting. Negative for abdominal pain and diarrhea.  Genitourinary: Negative for difficulty urinating and dysuria.  Musculoskeletal: Negative for neck pain and neck stiffness.  Skin: Negative for wound.  Neurological: Positive for weakness and headaches.  All other systems reviewed and are negative.    Physical Exam Updated Vital Signs BP (!) 123/93 (BP Location: Left Arm)   Pulse (!) 52   Temp 97.7 F (36.5 C) (Oral)   Resp 16   Ht 6\' 1"  (1.854 m)   Wt 63.5 kg (140 lb)   SpO2 100%   BMI 18.47 kg/m   Physical Exam  Constitutional: He is oriented to person, place, and time. He appears well-developed and well-nourished.  Uncomfortable. Not very cooperative on exam.   HENT:  Head: Normocephalic and atraumatic.  Nose: Nose normal.  Eyes: Conjunctivae and EOM are normal. Pupils are equal, round, and reactive to light.  Neck: Normal range of motion.  No TTP to neck. No nuchal rigidity noted.   Cardiovascular: Normal rate.   Pulmonary/Chest: Effort normal and breath sounds normal. No respiratory distress. He has no wheezes. He has no rales.  Normal work of breathing. No respiratory distress noted.   Abdominal: Soft. Bowel sounds are normal. There is no  tenderness. There is no rebound and no guarding.  Musculoskeletal: Normal range of motion.  Neurological: He is alert and oriented to person, place, and time.  Cranial Nerves:  III,IV, VI: ptosis not present, extra-ocular movements intact bilaterally, direct and consensual pupillary light reflexes intact bilaterally V: facial sensation, jaw opening, and bite strength equal bilaterally VII: eyebrow raise, eyelid close, smile, frown, pucker equal bilaterally VIII: hearing grossly normal bilaterally  IX,X: palate elevation and swallowing intact XI: bilateral shoulder shrug and lateral head rotation equal and strong XII: midline tongue extension  Negative pronator drift, negative Romberg, negative RAM's, negative heel-to-shin, negative finger to nose.    Sensory intact.  Muscle strength 5/5 Patient able to ambulate without difficulty.   Negative kernig's sign. Negative Brudzinski's sign.   Skin: Skin is warm. Capillary refill takes less than 2 seconds.  Psychiatric: He has a normal mood and affect.  Nursing note and vitals reviewed.  ED Treatments / Results  Labs (all labs ordered are listed, but only abnormal results are displayed)  Labs Reviewed  COMPREHENSIVE METABOLIC PANEL - Abnormal; Notable for the following:       Result Value   Potassium 3.0 (*)    Glucose, Bld 108 (*)    Total Protein 8.8 (*)    Albumin 3.0 (*)    Alkaline Phosphatase 137 (*)    All other components within normal limits  CBC - Abnormal; Notable for the following:    WBC 3.6 (*)    RBC 3.77 (*)    Hemoglobin 11.6 (*)    HCT 33.6 (*)    All other components within normal limits  URINALYSIS, ROUTINE W REFLEX MICROSCOPIC - Abnormal; Notable for the following:    Color, Urine AMBER (*)    APPearance HAZY (*)    Bilirubin Urine MODERATE (*)    Protein, ur 100 (*)    Bacteria, UA RARE (*)    All other components within normal limits  LIPASE, BLOOD    EKG  EKG Interpretation None        Radiology Dg Chest 2 View  Result Date: 05/18/2017 CLINICAL DATA:  Weakness and vomiting for couple of days. Began as a migraine. Cough. History of HIV. Smoker. EXAM: CHEST  2 VIEW COMPARISON:  03/05/2017 FINDINGS: The heart size and mediastinal contours are within normal limits. Both lungs are clear. The visualized skeletal structures are unremarkable. IMPRESSION: No active cardiopulmonary disease. Electronically Signed   By: Burman NievesWilliam  Stevens M.D.   On: 05/18/2017 21:28   Ct Head Wo Contrast  Result Date: 05/18/2017 CLINICAL DATA:  Weakness and vomiting EXAM: CT HEAD WITHOUT CONTRAST TECHNIQUE: Contiguous axial images were obtained from the base of the skull through the vertex without intravenous contrast. COMPARISON:  09/15/2016 FINDINGS: Brain: No evidence of acute infarction, hemorrhage, hydrocephalus, extra-axial collection or mass lesion/mass effect. Vascular: No hyperdense vessel or unexpected calcification. Skull: Normal. Negative for fracture or focal lesion. Sinuses/Orbits: No acute finding. Other: None IMPRESSION: No CT evidence for acute intracranial abnormality Electronically Signed   By: Jasmine PangKim  Fujinaga M.D.   On: 05/18/2017 21:58    Procedures Procedures (including critical care time)  Medications Ordered in ED Medications  potassium chloride SA (K-DUR,KLOR-CON) CR tablet 40 mEq (not administered)  ondansetron (ZOFRAN-ODT) disintegrating tablet 4 mg (4 mg Oral Given 05/18/17 1854)  metoCLOPramide (REGLAN) injection 10 mg (10 mg Intravenous Given 05/18/17 2209)  diphenhydrAMINE (BENADRYL) injection 25 mg (25 mg Intravenous Given 05/18/17 2211)  sodium chloride 0.9 % bolus 1,000 mL (1,000 mLs Intravenous New Bag/Given 05/18/17 2255)     Initial Impression / Assessment and Plan / ED Course  I have reviewed the triage vital signs and the nursing notes.  Pertinent labs & imaging results that were available during my care of the patient were reviewed by me and considered in my  medical decision making (see chart for details).    Pt not very cooperative on H&P, drowsy, difficult to decipher whether organic or not. Pt HA treated and improved while in ED. Hx of AIDS currently on treatment.  Presentation reportedly new and different from previous headaches. No episodes of emesis here in ED.  CT negative for acute intracranial abnormality.  No meningeal signs noted. Non concerning for [facility], ICH, Meningitis, or temporal arteritis at this time. Pt is afebrile with no focal neuro deficits, nuchal rigidity, or change in vision. Pt is to follow up with PCP regarding today's visit. Pt in  NAD, hemodynamically stable, afebrile. Pt verbalizes understanding and is agreeable with plan to dc. Reasons to immediately return to ED discussed. Potassium is slightly lowered to 3.0 Other labwork is similar to previous and reassuring. Pt given fluids, migraine cocktail and potassium prior to discharge with improvement of symptoms.  Pt also seen by my attending, Dr. Anitra Lauth, who also had a difficulty with obtaining history and exam and also questioned pt's effort. She did not think pt was a good candidate for lumbar puncture due to absent infectious symptoms, no meningeal signs. She agreed with plan and assessment.   Final Clinical Impressions(s) / ED Diagnoses   Final diagnoses:  Nonintractable headache, unspecified chronicity pattern, unspecified headache type  Hypokalemia    New Prescriptions New Prescriptions   No medications on file     Alvina Chou, [place] 05/18/17 2315    Gwyneth Sprout, MD 05/19/17 [phone]

## 2017-05-18 NOTE — ED Notes (Signed)
Patient transported to CT 

## 2017-05-18 NOTE — ED Triage Notes (Signed)
Patient reports weakness and vomiting x couple days that started as migraine.

## 2017-05-18 NOTE — ED Notes (Signed)
ED Provider at bedside. 

## 2017-05-18 NOTE — ED Notes (Signed)
Patient transported to X-ray 

## 2017-05-20 ENCOUNTER — Emergency Department (HOSPITAL_COMMUNITY): Payer: No Typology Code available for payment source

## 2017-05-20 ENCOUNTER — Inpatient Hospital Stay (HOSPITAL_COMMUNITY): Payer: No Typology Code available for payment source

## 2017-05-20 ENCOUNTER — Encounter (HOSPITAL_COMMUNITY): Payer: Self-pay | Admitting: Radiology

## 2017-05-20 ENCOUNTER — Inpatient Hospital Stay (HOSPITAL_COMMUNITY)
Admission: EM | Admit: 2017-05-20 | Discharge: 2017-06-27 | DRG: 853 | Disposition: A | Discharge status: Home Health | Payer: No Typology Code available for payment source | Attending: Internal Medicine | Admitting: Internal Medicine

## 2017-05-20 DIAGNOSIS — R471 Dysarthria and anarthria: Secondary | ICD-10-CM | POA: Diagnosis not present

## 2017-05-20 DIAGNOSIS — J9601 Acute respiratory failure with hypoxia: Secondary | ICD-10-CM | POA: Diagnosis not present

## 2017-05-20 DIAGNOSIS — G40901 Epilepsy, unspecified, not intractable, with status epilepticus: Secondary | ICD-10-CM | POA: Diagnosis not present

## 2017-05-20 DIAGNOSIS — G92 Toxic encephalopathy: Secondary | ICD-10-CM | POA: Diagnosis not present

## 2017-05-20 DIAGNOSIS — Z8619 Personal history of other infectious and parasitic diseases: Secondary | ICD-10-CM | POA: Diagnosis present

## 2017-05-20 DIAGNOSIS — R1310 Dysphagia, unspecified: Secondary | ICD-10-CM | POA: Diagnosis not present

## 2017-05-20 DIAGNOSIS — Z794 Long term (current) use of insulin: Secondary | ICD-10-CM | POA: Diagnosis not present

## 2017-05-20 DIAGNOSIS — R197 Diarrhea, unspecified: Secondary | ICD-10-CM | POA: Diagnosis not present

## 2017-05-20 DIAGNOSIS — G919 Hydrocephalus, unspecified: Secondary | ICD-10-CM | POA: Diagnosis present

## 2017-05-20 DIAGNOSIS — I10 Essential (primary) hypertension: Secondary | ICD-10-CM | POA: Diagnosis present

## 2017-05-20 DIAGNOSIS — R402122 Coma scale, eyes open, to pain, at arrival to emergency department: Secondary | ICD-10-CM | POA: Diagnosis present

## 2017-05-20 DIAGNOSIS — Z8249 Family history of ischemic heart disease and other diseases of the circulatory system: Secondary | ICD-10-CM | POA: Diagnosis not present

## 2017-05-20 DIAGNOSIS — B20 Human immunodeficiency virus [HIV] disease: Secondary | ICD-10-CM | POA: Diagnosis present

## 2017-05-20 DIAGNOSIS — Z21 Asymptomatic human immunodeficiency virus [HIV] infection status: Secondary | ICD-10-CM | POA: Diagnosis present

## 2017-05-20 DIAGNOSIS — Z452 Encounter for adjustment and management of vascular access device: Secondary | ICD-10-CM

## 2017-05-20 DIAGNOSIS — G039 Meningitis, unspecified: Secondary | ICD-10-CM

## 2017-05-20 DIAGNOSIS — Z9911 Dependence on respirator [ventilator] status: Secondary | ICD-10-CM | POA: Diagnosis not present

## 2017-05-20 DIAGNOSIS — T420X5A Adverse effect of hydantoin derivatives, initial encounter: Secondary | ICD-10-CM | POA: Diagnosis present

## 2017-05-20 DIAGNOSIS — N179 Acute kidney failure, unspecified: Secondary | ICD-10-CM | POA: Diagnosis not present

## 2017-05-20 DIAGNOSIS — F17210 Nicotine dependence, cigarettes, uncomplicated: Secondary | ICD-10-CM | POA: Diagnosis not present

## 2017-05-20 DIAGNOSIS — Z8661 Personal history of infections of the central nervous system: Secondary | ICD-10-CM | POA: Diagnosis present

## 2017-05-20 DIAGNOSIS — E43 Unspecified severe protein-calorie malnutrition: Secondary | ICD-10-CM | POA: Diagnosis present

## 2017-05-20 DIAGNOSIS — D709 Neutropenia, unspecified: Secondary | ICD-10-CM | POA: Diagnosis not present

## 2017-05-20 DIAGNOSIS — R402352 Coma scale, best motor response, localizes pain, at arrival to emergency department: Secondary | ICD-10-CM | POA: Diagnosis present

## 2017-05-20 DIAGNOSIS — R7401 Elevation of levels of liver transaminase levels: Secondary | ICD-10-CM

## 2017-05-20 DIAGNOSIS — L22 Diaper dermatitis: Secondary | ICD-10-CM | POA: Diagnosis present

## 2017-05-20 DIAGNOSIS — J156 Pneumonia due to other aerobic Gram-negative bacteria: Secondary | ICD-10-CM | POA: Diagnosis present

## 2017-05-20 DIAGNOSIS — R569 Unspecified convulsions: Secondary | ICD-10-CM

## 2017-05-20 DIAGNOSIS — G9600 Cerebrospinal fluid leak, unspecified: Secondary | ICD-10-CM

## 2017-05-20 DIAGNOSIS — Z681 Body mass index (BMI) 19 or less, adult: Secondary | ICD-10-CM | POA: Diagnosis not present

## 2017-05-20 DIAGNOSIS — H5704 Mydriasis: Secondary | ICD-10-CM | POA: Diagnosis not present

## 2017-05-20 DIAGNOSIS — G932 Benign intracranial hypertension: Secondary | ICD-10-CM | POA: Diagnosis present

## 2017-05-20 DIAGNOSIS — Z982 Presence of cerebrospinal fluid drainage device: Secondary | ICD-10-CM

## 2017-05-20 DIAGNOSIS — G935 Compression of brain: Secondary | ICD-10-CM | POA: Diagnosis not present

## 2017-05-20 DIAGNOSIS — F99 Mental disorder, not otherwise specified: Secondary | ICD-10-CM | POA: Diagnosis not present

## 2017-05-20 DIAGNOSIS — Z781 Physical restraint status: Secondary | ICD-10-CM

## 2017-05-20 DIAGNOSIS — R7881 Bacteremia: Secondary | ICD-10-CM | POA: Diagnosis not present

## 2017-05-20 DIAGNOSIS — I6789 Other cerebrovascular disease: Secondary | ICD-10-CM | POA: Diagnosis not present

## 2017-05-20 DIAGNOSIS — F319 Bipolar disorder, unspecified: Secondary | ICD-10-CM | POA: Diagnosis present

## 2017-05-20 DIAGNOSIS — Z720 Tobacco use: Secondary | ICD-10-CM

## 2017-05-20 DIAGNOSIS — D649 Anemia, unspecified: Secondary | ICD-10-CM | POA: Diagnosis present

## 2017-05-20 DIAGNOSIS — Z978 Presence of other specified devices: Secondary | ICD-10-CM | POA: Diagnosis not present

## 2017-05-20 DIAGNOSIS — D72819 Decreased white blood cell count, unspecified: Secondary | ICD-10-CM | POA: Diagnosis not present

## 2017-05-20 DIAGNOSIS — I69314 Frontal lobe and executive function deficit following cerebral infarction: Secondary | ICD-10-CM

## 2017-05-20 DIAGNOSIS — F09 Unspecified mental disorder due to known physiological condition: Secondary | ICD-10-CM | POA: Diagnosis not present

## 2017-05-20 DIAGNOSIS — G003 Staphylococcal meningitis: Secondary | ICD-10-CM | POA: Diagnosis present

## 2017-05-20 DIAGNOSIS — A403 Sepsis due to Streptococcus pneumoniae: Principal | ICD-10-CM | POA: Diagnosis present

## 2017-05-20 DIAGNOSIS — G4700 Insomnia, unspecified: Secondary | ICD-10-CM | POA: Diagnosis present

## 2017-05-20 DIAGNOSIS — B957 Other staphylococcus as the cause of diseases classified elsewhere: Secondary | ICD-10-CM

## 2017-05-20 DIAGNOSIS — R680 Hypothermia, not associated with low environmental temperature: Secondary | ICD-10-CM | POA: Diagnosis not present

## 2017-05-20 DIAGNOSIS — R740 Nonspecific elevation of levels of transaminase and lactic acid dehydrogenase [LDH]: Secondary | ICD-10-CM

## 2017-05-20 DIAGNOSIS — A3781 Whooping cough due to other Bordetella species with pneumonia: Secondary | ICD-10-CM | POA: Diagnosis not present

## 2017-05-20 DIAGNOSIS — R21 Rash and other nonspecific skin eruption: Secondary | ICD-10-CM | POA: Diagnosis present

## 2017-05-20 DIAGNOSIS — Z9119 Patient's noncompliance with other medical treatment and regimen: Secondary | ICD-10-CM | POA: Diagnosis not present

## 2017-05-20 DIAGNOSIS — B953 Streptococcus pneumoniae as the cause of diseases classified elsewhere: Secondary | ICD-10-CM

## 2017-05-20 DIAGNOSIS — R339 Retention of urine, unspecified: Secondary | ICD-10-CM | POA: Diagnosis not present

## 2017-05-20 DIAGNOSIS — G928 Other toxic encephalopathy: Secondary | ICD-10-CM

## 2017-05-20 DIAGNOSIS — T378X5A Adverse effect of other specified systemic anti-infectives and antiparasitics, initial encounter: Secondary | ICD-10-CM | POA: Diagnosis not present

## 2017-05-20 DIAGNOSIS — R338 Other retention of urine: Secondary | ICD-10-CM

## 2017-05-20 DIAGNOSIS — J9690 Respiratory failure, unspecified, unspecified whether with hypoxia or hypercapnia: Secondary | ICD-10-CM

## 2017-05-20 DIAGNOSIS — R402242 Coma scale, best verbal response, confused conversation, at arrival to emergency department: Secondary | ICD-10-CM | POA: Diagnosis present

## 2017-05-20 DIAGNOSIS — J9600 Acute respiratory failure, unspecified whether with hypoxia or hypercapnia: Secondary | ICD-10-CM | POA: Diagnosis not present

## 2017-05-20 DIAGNOSIS — K614 Intrasphincteric abscess: Secondary | ICD-10-CM | POA: Diagnosis present

## 2017-05-20 DIAGNOSIS — B451 Cerebral cryptococcosis: Secondary | ICD-10-CM | POA: Diagnosis not present

## 2017-05-20 DIAGNOSIS — Z9289 Personal history of other medical treatment: Secondary | ICD-10-CM

## 2017-05-20 DIAGNOSIS — Z23 Encounter for immunization: Secondary | ICD-10-CM

## 2017-05-20 DIAGNOSIS — E119 Type 2 diabetes mellitus without complications: Secondary | ICD-10-CM | POA: Diagnosis not present

## 2017-05-20 DIAGNOSIS — G9340 Encephalopathy, unspecified: Secondary | ICD-10-CM | POA: Diagnosis not present

## 2017-05-20 DIAGNOSIS — E876 Hypokalemia: Secondary | ICD-10-CM | POA: Diagnosis present

## 2017-05-20 DIAGNOSIS — R0682 Tachypnea, not elsewhere classified: Secondary | ICD-10-CM | POA: Diagnosis not present

## 2017-05-20 DIAGNOSIS — R4020 Unspecified coma: Secondary | ICD-10-CM | POA: Diagnosis present

## 2017-05-20 DIAGNOSIS — G0490 Encephalitis and encephalomyelitis, unspecified: Secondary | ICD-10-CM

## 2017-05-20 DIAGNOSIS — B181 Chronic viral hepatitis B without delta-agent: Secondary | ICD-10-CM | POA: Diagnosis present

## 2017-05-20 DIAGNOSIS — R51 Headache: Secondary | ICD-10-CM

## 2017-05-20 DIAGNOSIS — R519 Headache, unspecified: Secondary | ICD-10-CM | POA: Insufficient documentation

## 2017-05-20 DIAGNOSIS — I639 Cerebral infarction, unspecified: Secondary | ICD-10-CM | POA: Diagnosis not present

## 2017-05-20 DIAGNOSIS — G910 Communicating hydrocephalus: Secondary | ICD-10-CM | POA: Diagnosis not present

## 2017-05-20 DIAGNOSIS — A3780 Whooping cough due to other Bordetella species without pneumonia: Secondary | ICD-10-CM | POA: Diagnosis not present

## 2017-05-20 DIAGNOSIS — R4182 Altered mental status, unspecified: Secondary | ICD-10-CM | POA: Diagnosis present

## 2017-05-20 DIAGNOSIS — J189 Pneumonia, unspecified organism: Secondary | ICD-10-CM

## 2017-05-20 DIAGNOSIS — Z8673 Personal history of transient ischemic attack (TIA), and cerebral infarction without residual deficits: Secondary | ICD-10-CM

## 2017-05-20 DIAGNOSIS — A3700 Whooping cough due to Bordetella pertussis without pneumonia: Secondary | ICD-10-CM

## 2017-05-20 DIAGNOSIS — F3170 Bipolar disorder, currently in remission, most recent episode unspecified: Secondary | ICD-10-CM | POA: Diagnosis not present

## 2017-05-20 DIAGNOSIS — B180 Chronic viral hepatitis B with delta-agent: Secondary | ICD-10-CM

## 2017-05-20 HISTORY — DX: Personal history of transient ischemic attack (TIA), and cerebral infarction without residual deficits: Z86.73

## 2017-05-20 HISTORY — DX: Frontal lobe and executive function deficit following cerebral infarction: I69.314

## 2017-05-20 HISTORY — DX: Cerebral cryptococcosis: B45.1

## 2017-05-20 LAB — URINALYSIS, ROUTINE W REFLEX MICROSCOPIC
Bilirubin Urine: NEGATIVE
Glucose, UA: NEGATIVE mg/dL
Hgb urine dipstick: NEGATIVE
KETONES UR: NEGATIVE mg/dL
LEUKOCYTES UA: NEGATIVE
Nitrite: NEGATIVE
PH: 7 (ref 5.0–8.0)
Protein, ur: NEGATIVE mg/dL
Specific Gravity, Urine: 1.02 (ref 1.005–1.030)

## 2017-05-20 LAB — CBC WITH DIFFERENTIAL/PLATELET
Basophils Absolute: 0 10*3/uL (ref 0.0–0.1)
Basophils Relative: 0 %
Eosinophils Absolute: 0 10*3/uL (ref 0.0–0.7)
Eosinophils Relative: 0 %
HCT: 34.7 % — ABNORMAL LOW (ref 39.0–52.0)
Hemoglobin: 11.5 g/dL — ABNORMAL LOW (ref 13.0–17.0)
LYMPHS PCT: 4 %
Lymphs Abs: 0.3 10*3/uL — ABNORMAL LOW (ref 0.7–4.0)
MCH: 30.3 pg (ref 26.0–34.0)
MCHC: 33.1 g/dL (ref 30.0–36.0)
MCV: 91.3 fL (ref 78.0–100.0)
MONOS PCT: 18 %
Monocytes Absolute: 1.3 10*3/uL — ABNORMAL HIGH (ref 0.1–1.0)
NEUTROS PCT: 78 %
Neutro Abs: 5.5 10*3/uL (ref 1.7–7.7)
Platelets: 383 10*3/uL (ref 150–400)
RBC: 3.8 MIL/uL — AB (ref 4.22–5.81)
RDW: 13.4 % (ref 11.5–15.5)
WBC: 7.1 10*3/uL (ref 4.0–10.5)

## 2017-05-20 LAB — CSF CELL COUNT WITH DIFFERENTIAL
Lymphs, CSF: 95 % — ABNORMAL HIGH (ref 40–80)
RBC COUNT CSF: 1 /mm3 — AB
RBC Count, CSF: 3 /mm3 — ABNORMAL HIGH
SEGMENTED NEUTROPHILS-CSF: 5 % (ref 0–6)
TUBE #: 1
Tube #: 4
WBC, CSF: 14 /mm3 (ref 0–5)
WBC, CSF: 6 /mm3 — ABNORMAL HIGH (ref 0–5)

## 2017-05-20 LAB — COMPREHENSIVE METABOLIC PANEL
ALT: <5 U/L — ABNORMAL LOW (ref 17–63)
ALT: 21 U/L (ref 17–63)
ANION GAP: 7 (ref 5–15)
AST: 59 U/L — AB (ref 15–41)
AST: 44 U/L — AB (ref 15–41)
Albumin: 3 g/dL — ABNORMAL LOW (ref 3.5–5.0)
Albumin: 2.6 g/dL — ABNORMAL LOW (ref 3.5–5.0)
Alkaline Phosphatase: 140 U/L — ABNORMAL HIGH (ref 38–126)
Alkaline Phosphatase: 128 U/L — ABNORMAL HIGH (ref 38–126)
Anion gap: 12 (ref 5–15)
BUN: 7 mg/dL (ref 6–20)
BUN: 5 mg/dL — AB (ref 6–20)
CHLORIDE: 102 mmol/L (ref 101–111)
CO2: 23 mmol/L (ref 22–32)
CO2: 24 mmol/L (ref 22–32)
Calcium: 9.6 mg/dL (ref 8.9–10.3)
Calcium: 8.9 mg/dL (ref 8.9–10.3)
Chloride: 104 mmol/L (ref 101–111)
Creatinine, Ser: 1.01 mg/dL (ref 0.61–1.24)
Creatinine, Ser: 0.82 mg/dL (ref 0.61–1.24)
GFR calc Af Amer: >60 mL/min (ref >60)
Glucose, Bld: 162 mg/dL — ABNORMAL HIGH (ref 65–99)
Glucose, Bld: 109 mg/dL — ABNORMAL HIGH (ref 65–99)
POTASSIUM: 2.7 mmol/L — AB (ref 3.5–5.1)
POTASSIUM: 3 mmol/L — AB (ref 3.5–5.1)
Sodium: 137 mmol/L (ref 135–145)
Sodium: 135 mmol/L (ref 135–145)
TOTAL PROTEIN: 8.1 g/dL (ref 6.5–8.1)
Total Bilirubin: 0.8 mg/dL (ref 0.3–1.2)
Total Bilirubin: 0.7 mg/dL (ref 0.3–1.2)
Total Protein: 9 g/dL — ABNORMAL HIGH (ref 6.5–8.1)

## 2017-05-20 LAB — I-STAT ARTERIAL BLOOD GAS, ED
ACID-BASE EXCESS: 2 mmol/L (ref 0.0–2.0)
Bicarbonate: 23.7 mmol/L (ref 20.0–28.0)
O2 SAT: 99 %
PH ART: 7.561 — AB (ref 7.350–7.450)
PO2 ART: 96 mmHg (ref 83.0–108.0)
Patient temperature: 97.7
TCO2: 25 mmol/L (ref 0–100)
pCO2 arterial: 26.3 mmHg — ABNORMAL LOW (ref 32.0–48.0)

## 2017-05-20 LAB — PROTIME-INR
INR: 1.13
INR: 1.2
PROTHROMBIN TIME: 15.3 s — AB (ref 11.4–15.2)
Prothrombin Time: 14.6 seconds (ref 11.4–15.2)

## 2017-05-20 LAB — CBC
HCT: 33.5 % — ABNORMAL LOW (ref 39.0–52.0)
Hemoglobin: 11.3 g/dL — ABNORMAL LOW (ref 13.0–17.0)
MCH: 30.5 pg (ref 26.0–34.0)
MCHC: 33.7 g/dL (ref 30.0–36.0)
MCV: 90.3 fL (ref 78.0–100.0)
PLATELETS: 305 10*3/uL (ref 150–400)
RBC: 3.71 MIL/uL — ABNORMAL LOW (ref 4.22–5.81)
RDW: 13.4 % (ref 11.5–15.5)
WBC: 4.2 10*3/uL (ref 4.0–10.5)

## 2017-05-20 LAB — CORTISOL: Cortisol, Plasma: 12.1 ug/dL

## 2017-05-20 LAB — TYPE AND SCREEN
ABO/RH(D): B NEG
Antibody Screen: NEGATIVE

## 2017-05-20 LAB — ABO/RH: ABO/RH(D): B NEG

## 2017-05-20 LAB — LACTIC ACID, PLASMA
LACTIC ACID, VENOUS: 2.3 mmol/L — AB (ref 0.5–1.9)
LACTIC ACID, VENOUS: 2.5 mmol/L — AB (ref 0.5–1.9)

## 2017-05-20 LAB — GLUCOSE, CSF: Glucose, CSF: 62 mg/dL (ref 40–70)

## 2017-05-20 LAB — TROPONIN I

## 2017-05-20 LAB — APTT: aPTT: 34 seconds (ref 24–36)

## 2017-05-20 LAB — MAGNESIUM: Magnesium: 1.4 mg/dL — ABNORMAL LOW (ref 1.7–2.4)

## 2017-05-20 LAB — I-STAT CG4 LACTIC ACID, ED: LACTIC ACID, VENOUS: 6.64 mmol/L — AB (ref 0.5–1.9)

## 2017-05-20 LAB — PROTEIN, CSF: Total  Protein, CSF: 39 mg/dL (ref 15–45)

## 2017-05-20 LAB — PROCALCITONIN: Procalcitonin: 0.2 ng/mL

## 2017-05-20 LAB — GLUCOSE, CAPILLARY
GLUCOSE-CAPILLARY: 147 mg/dL — AB (ref 65–99)
Glucose-Capillary: 125 mg/dL — ABNORMAL HIGH (ref 65–99)

## 2017-05-20 LAB — MRSA PCR SCREENING: MRSA BY PCR: NEGATIVE

## 2017-05-20 LAB — TRIGLYCERIDES: Triglycerides: 51 mg/dL (ref <150)

## 2017-05-20 MED ORDER — SODIUM CHLORIDE 0.9 % IV BOLUS (SEPSIS)
500.0000 mL | INTRAVENOUS | Status: DC
  Administered 2017-05-21 – 2017-05-23 (×3): 500 mL via INTRAVENOUS
  Administered 2017-05-24: 22:00:00 via INTRAVENOUS
  Administered 2017-05-25 – 2017-05-26 (×2): 500 mL via INTRAVENOUS

## 2017-05-20 MED ORDER — SODIUM CHLORIDE 0.9 % IV BOLUS (SEPSIS): 1000.0000 mL | Freq: Once | INTRAVENOUS | Status: DC

## 2017-05-20 MED ORDER — POTASSIUM CHLORIDE 10 MEQ/100ML IV SOLN
10.0000 meq | INTRAVENOUS | Status: AC
  Administered 2017-05-20 (×4): 10 meq via INTRAVENOUS
  Filled 2017-05-20 (×3): qty 100

## 2017-05-20 MED ORDER — ONDANSETRON HCL 4 MG/2ML IJ SOLN: 4.0000 mg | Freq: Four times a day (QID) | INTRAMUSCULAR | Status: DC | PRN

## 2017-05-20 MED ORDER — DIPHENHYDRAMINE HCL 25 MG PO CAPS
25.0000 mg | ORAL_CAPSULE | ORAL | Status: DC
  Administered 2017-05-21 – 2017-06-06 (×17): 25 mg via ORAL
  Filled 2017-05-20 (×17): qty 1

## 2017-05-20 MED ORDER — MAGNESIUM SULFATE 2 GM/50ML IV SOLN
2.0000 g | Freq: Once | INTRAVENOUS | Status: AC
  Administered 2017-05-20: 2 g via INTRAVENOUS
  Filled 2017-05-20: qty 50

## 2017-05-20 MED ORDER — DEXAMETHASONE SODIUM PHOSPHATE 10 MG/ML IJ SOLN
10.0000 mg | Freq: Once | INTRAMUSCULAR | Status: AC
  Administered 2017-05-20: 10 mg via INTRAVENOUS
  Filled 2017-05-20: qty 1

## 2017-05-20 MED ORDER — DEXTROSE 5 % IV SOLN
10.0000 mg/kg | Freq: Three times a day (TID) | INTRAVENOUS | Status: DC
  Administered 2017-05-20: 635 mg via INTRAVENOUS
  Filled 2017-05-20 (×3): qty 12.7

## 2017-05-20 MED ORDER — SODIUM CHLORIDE 0.9 % IV BOLUS (SEPSIS)
1000.0000 mL | Freq: Once | INTRAVENOUS | Status: AC
  Administered 2017-05-20: 1000 mL via INTRAVENOUS

## 2017-05-20 MED ORDER — FLUCYTOSINE 250 MG PO CAPS
1500.0000 mg | ORAL_CAPSULE | Freq: Four times a day (QID) | ORAL | Status: DC
  Administered 2017-05-20 – 2017-06-16 (×104): 1500 mg
  Filled 2017-05-20 (×42): qty 6
  Filled 2017-05-20: qty 3
  Filled 2017-05-20 (×72): qty 6

## 2017-05-20 MED ORDER — FLUCYTOSINE 500 MG PO CAPS
25.0000 mg/kg | ORAL_CAPSULE | Freq: Four times a day (QID) | ORAL | Status: DC
  Filled 2017-05-20 (×3): qty 3

## 2017-05-20 MED ORDER — SULFAMETHOXAZOLE-TRIMETHOPRIM 200-40 MG/5ML PO SUSP
10.0000 mL | Freq: Every day | ORAL | Status: DC
  Administered 2017-05-20 – 2017-06-16 (×28): 10 mL
  Filled 2017-05-20 (×28): qty 10

## 2017-05-20 MED ORDER — ORAL CARE MOUTH RINSE
15.0000 mL | OROMUCOSAL | Status: DC
  Administered 2017-05-20 – 2017-05-27 (×60): 15 mL via OROMUCOSAL

## 2017-05-20 MED ORDER — POTASSIUM CHLORIDE 10 MEQ/100ML IV SOLN
10.0000 meq | Freq: Once | INTRAVENOUS | Status: AC
  Administered 2017-05-20: 10 meq via INTRAVENOUS
  Filled 2017-05-20: qty 100

## 2017-05-20 MED ORDER — FENTANYL CITRATE (PF) 100 MCG/2ML IJ SOLN
100.0000 ug | INTRAMUSCULAR | Status: DC | PRN
  Administered 2017-05-20 (×2): 100 ug via INTRAVENOUS
  Filled 2017-05-20 (×7): qty 2

## 2017-05-20 MED ORDER — EMTRICITAB-RILPIVIR-TENOFOV AF 200-25-25 MG PO TABS: 1.0000 | ORAL_TABLET | Freq: Every day | ORAL | Status: DC

## 2017-05-20 MED ORDER — LORAZEPAM 2 MG/ML IJ SOLN
1.0000 mg | Freq: Once | INTRAMUSCULAR | Status: DC
  Filled 2017-05-20: qty 1

## 2017-05-20 MED ORDER — FENTANYL CITRATE (PF) 100 MCG/2ML IJ SOLN
100.0000 ug | INTRAMUSCULAR | Status: DC | PRN
  Administered 2017-05-21: 100 ug via INTRAVENOUS
  Filled 2017-05-20: qty 2

## 2017-05-20 MED ORDER — METOPROLOL TARTRATE 5 MG/5ML IV SOLN
INTRAVENOUS | Status: AC
  Filled 2017-05-20: qty 5

## 2017-05-20 MED ORDER — DOCUSATE SODIUM 50 MG/5ML PO LIQD: 100.0000 mg | Freq: Two times a day (BID) | ORAL | Status: DC | PRN

## 2017-05-20 MED ORDER — LORAZEPAM 2 MG/ML IJ SOLN
1.0000 mg | Freq: Once | INTRAMUSCULAR | Status: AC
  Administered 2017-05-20: 1 mg via INTRAVENOUS

## 2017-05-20 MED ORDER — HYDRALAZINE HCL 20 MG/ML IJ SOLN
10.0000 mg | INTRAMUSCULAR | Status: DC | PRN
  Administered 2017-05-25 – 2017-05-26 (×2): 10 mg via INTRAVENOUS
  Filled 2017-05-20 (×2): qty 1

## 2017-05-20 MED ORDER — SODIUM CHLORIDE 0.9 % IV SOLN
1000.0000 mg | Freq: Two times a day (BID) | INTRAVENOUS | Status: DC
  Administered 2017-05-20 – 2017-06-15 (×53): 1000 mg via INTRAVENOUS
  Filled 2017-05-20 (×55): qty 10

## 2017-05-20 MED ORDER — VANCOMYCIN HCL IN DEXTROSE 1-5 GM/200ML-% IV SOLN
1000.0000 mg | Freq: Three times a day (TID) | INTRAVENOUS | Status: DC
  Filled 2017-05-20 (×2): qty 200

## 2017-05-20 MED ORDER — DEXTROSE 5 % IV SOLN
4.0000 mg/kg | INTRAVENOUS | Status: DC
  Administered 2017-05-20 – 2017-06-18 (×30): 250 mg via INTRAVENOUS
  Filled 2017-05-20 (×39): qty 250

## 2017-05-20 MED ORDER — LORAZEPAM 2 MG/ML IJ SOLN
INTRAMUSCULAR | Status: AC
  Filled 2017-05-20: qty 1

## 2017-05-20 MED ORDER — SULFAMETHOXAZOLE-TRIMETHOPRIM 400-80 MG PO TABS: 1.0000 | ORAL_TABLET | Freq: Every day | ORAL | Status: DC

## 2017-05-20 MED ORDER — SODIUM CHLORIDE 0.9 % IV SOLN
2.0000 g | Freq: Once | INTRAVENOUS | Status: DC
  Filled 2017-05-20: qty 2000

## 2017-05-20 MED ORDER — METOPROLOL TARTRATE 5 MG/5ML IV SOLN
INTRAVENOUS | Status: AC
  Administered 2017-05-20: 5 mg
  Filled 2017-05-20: qty 5

## 2017-05-20 MED ORDER — LIDOCAINE HCL (PF) 1 % IJ SOLN
5.0000 mL | Freq: Once | INTRAMUSCULAR | Status: AC
  Administered 2017-05-20: 5 mL
  Filled 2017-05-20: qty 5

## 2017-05-20 MED ORDER — SODIUM CHLORIDE 0.9 % IV SOLN
1000.0000 mg | Freq: Once | INTRAVENOUS | Status: AC
  Administered 2017-05-20: 1000 mg via INTRAVENOUS
  Filled 2017-05-20: qty 10

## 2017-05-20 MED ORDER — SODIUM CHLORIDE 0.9 % IV BOLUS (SEPSIS)
500.0000 mL | INTRAVENOUS | Status: DC
  Administered 2017-05-21 – 2017-05-26 (×6): 500 mL via INTRAVENOUS

## 2017-05-20 MED ORDER — RILPIVIRINE HCL 25 MG PO TABS
25.0000 mg | ORAL_TABLET | Freq: Every day | ORAL | Status: DC
  Filled 2017-05-20: qty 1

## 2017-05-20 MED ORDER — SODIUM CHLORIDE 0.9 % IV SOLN
1.0000 g | INTRAVENOUS | Status: DC
  Filled 2017-05-20 (×4): qty 1000

## 2017-05-20 MED ORDER — ROCURONIUM BROMIDE 50 MG/5ML IV SOLN: 1.0000 mg/kg | Freq: Once | INTRAVENOUS | Status: DC

## 2017-05-20 MED ORDER — VANCOMYCIN HCL IN DEXTROSE 1-5 GM/200ML-% IV SOLN
1000.0000 mg | Freq: Once | INTRAVENOUS | Status: AC
  Administered 2017-05-20: 1000 mg via INTRAVENOUS
  Filled 2017-05-20: qty 200

## 2017-05-20 MED ORDER — INSULIN ASPART 100 UNIT/ML ~~LOC~~ SOLN
2.0000 [IU] | SUBCUTANEOUS | Status: DC
  Administered 2017-05-20 – 2017-05-21 (×4): 2 [IU] via SUBCUTANEOUS
  Administered 2017-05-21: 4 [IU] via SUBCUTANEOUS
  Administered 2017-05-22: 2 [IU] via SUBCUTANEOUS
  Administered 2017-05-22: 4 [IU] via SUBCUTANEOUS
  Administered 2017-05-22: 2 [IU] via SUBCUTANEOUS
  Administered 2017-05-22: 4 [IU] via SUBCUTANEOUS
  Administered 2017-05-22: 2 [IU] via SUBCUTANEOUS
  Administered 2017-05-22 (×2): 4 [IU] via SUBCUTANEOUS
  Administered 2017-05-23 (×3): 2 [IU] via SUBCUTANEOUS
  Administered 2017-05-23: 3 [IU] via SUBCUTANEOUS
  Administered 2017-05-23 – 2017-05-27 (×15): 2 [IU] via SUBCUTANEOUS
  Administered 2017-05-27: 4 [IU] via SUBCUTANEOUS
  Administered 2017-05-28 (×2): 2 [IU] via SUBCUTANEOUS
  Administered 2017-05-31: 4 [IU] via SUBCUTANEOUS

## 2017-05-20 MED ORDER — LORAZEPAM 2 MG/ML IJ SOLN
2.0000 mg | Freq: Once | INTRAMUSCULAR | Status: AC
  Administered 2017-05-20: 2 mg via INTRAVENOUS

## 2017-05-20 MED ORDER — ACETAMINOPHEN 10 MG/ML IV SOLN
1000.0000 mg | Freq: Once | INTRAVENOUS | Status: AC
  Administered 2017-05-20: 1000 mg via INTRAVENOUS
  Filled 2017-05-20: qty 100

## 2017-05-20 MED ORDER — PROPOFOL 1000 MG/100ML IV EMUL
0.0000 ug/kg/min | INTRAVENOUS | Status: DC
  Administered 2017-05-20: 20 ug/kg/min via INTRAVENOUS
  Administered 2017-05-20 – 2017-05-21 (×2): 50 ug/kg/min via INTRAVENOUS
  Administered 2017-05-21: 15 ug/kg/min via INTRAVENOUS
  Administered 2017-05-22: 29.921 ug/kg/min via INTRAVENOUS
  Administered 2017-05-22 – 2017-05-23 (×2): 30 ug/kg/min via INTRAVENOUS
  Administered 2017-05-24: 40 ug/kg/min via INTRAVENOUS
  Administered 2017-05-24: 35 ug/kg/min via INTRAVENOUS
  Administered 2017-05-25 (×3): 50 ug/kg/min via INTRAVENOUS
  Administered 2017-05-25: 30 ug/kg/min via INTRAVENOUS
  Administered 2017-05-26: 50 ug/kg/min via INTRAVENOUS
  Administered 2017-05-26: 30 ug/kg/min via INTRAVENOUS
  Administered 2017-05-26: 20 ug/kg/min via INTRAVENOUS
  Filled 2017-05-20 (×20): qty 100

## 2017-05-20 MED ORDER — DEXTROSE 5 % IV SOLN
2.0000 g | Freq: Two times a day (BID) | INTRAVENOUS | Status: DC
  Filled 2017-05-20: qty 2

## 2017-05-20 MED ORDER — PHENYTOIN SODIUM 50 MG/ML IJ SOLN
100.0000 mg | Freq: Three times a day (TID) | INTRAMUSCULAR | Status: DC
  Administered 2017-05-21: 100 mg via INTRAVENOUS
  Filled 2017-05-20: qty 2

## 2017-05-20 MED ORDER — MEPERIDINE HCL 25 MG/ML IJ SOLN: 25.0000 mg | INTRAMUSCULAR | Status: DC | PRN

## 2017-05-20 MED ORDER — ACETAMINOPHEN 325 MG PO TABS
650.0000 mg | ORAL_TABLET | ORAL | Status: DC
  Administered 2017-05-21 – 2017-06-19 (×30): 650 mg via ORAL
  Filled 2017-05-20 (×30): qty 2

## 2017-05-20 MED ORDER — KCL IN DEXTROSE-NACL 20-5-0.45 MEQ/L-%-% IV SOLN
INTRAVENOUS | Status: DC
  Administered 2017-05-20 – 2017-05-30 (×10): via INTRAVENOUS
  Filled 2017-05-20 (×14): qty 1000

## 2017-05-20 MED ORDER — EMTRICITABINE-TENOFOVIR AF 200-25 MG PO TABS
1.0000 | ORAL_TABLET | Freq: Every day | ORAL | Status: DC
Start: 2017-05-20 — End: 2017-05-20
  Filled 2017-05-20: qty 1

## 2017-05-20 MED ORDER — LORAZEPAM 2 MG/ML IJ SOLN
1.0000 mg | Freq: Once | INTRAMUSCULAR | Status: AC
  Administered 2017-05-20: 1 mg via INTRAVENOUS
  Filled 2017-05-20: qty 1

## 2017-05-20 MED ORDER — IOPAMIDOL (ISOVUE-300) INJECTION 61%
INTRAVENOUS | Status: AC
  Filled 2017-05-20: qty 75

## 2017-05-20 MED ORDER — ALBUTEROL SULFATE (2.5 MG/3ML) 0.083% IN NEBU: 2.5000 mg | INHALATION_SOLUTION | RESPIRATORY_TRACT | Status: DC | PRN

## 2017-05-20 MED ORDER — ACETAMINOPHEN 325 MG PO TABS
650.0000 mg | ORAL_TABLET | ORAL | Status: DC | PRN
  Administered 2017-06-12 – 2017-06-26 (×5): 650 mg via ORAL
  Filled 2017-05-20 (×5): qty 2

## 2017-05-20 MED ORDER — ETOMIDATE 2 MG/ML IV SOLN
INTRAVENOUS | Status: AC | PRN
  Administered 2017-05-20 (×2): 10 mg via INTRAVENOUS

## 2017-05-20 MED ORDER — FAMOTIDINE IN NACL 20-0.9 MG/50ML-% IV SOLN
20.0000 mg | Freq: Two times a day (BID) | INTRAVENOUS | Status: DC
  Administered 2017-05-20 – 2017-05-28 (×16): 20 mg via INTRAVENOUS
  Filled 2017-05-20 (×16): qty 50

## 2017-05-20 MED ORDER — ETOMIDATE 2 MG/ML IV SOLN: 10.0000 mg | Freq: Once | INTRAVENOUS | Status: DC

## 2017-05-20 MED ORDER — ROCURONIUM BROMIDE 50 MG/5ML IV SOLN
INTRAVENOUS | Status: AC | PRN
  Administered 2017-05-20: 65 mg via INTRAVENOUS

## 2017-05-20 MED ORDER — FENTANYL CITRATE (PF) 100 MCG/2ML IJ SOLN
100.0000 ug | INTRAMUSCULAR | Status: DC | PRN
  Administered 2017-05-25 – 2017-05-26 (×10): 100 ug via INTRAVENOUS
  Filled 2017-05-20 (×10): qty 2

## 2017-05-20 MED ORDER — PROPOFOL 1000 MG/100ML IV EMUL
5.0000 ug/kg/min | Freq: Once | INTRAVENOUS | Status: DC
Start: 2017-05-20 — End: 2017-05-20
  Filled 2017-05-20: qty 100

## 2017-05-20 MED ORDER — SODIUM CHLORIDE 0.9 % IV SOLN
250.0000 mL | INTRAVENOUS | Status: DC | PRN
  Administered 2017-05-24: 23:00:00 via INTRAVENOUS
  Administered 2017-05-24: 250 mL via INTRAVENOUS
  Administered 2017-06-02: 1000 mL via INTRAVENOUS

## 2017-05-20 MED ORDER — DOLUTEGRAVIR SODIUM 50 MG PO TABS: 50.0000 mg | ORAL_TABLET | Freq: Every day | ORAL | Status: DC

## 2017-05-20 MED ORDER — SODIUM CHLORIDE 0.9 % IV SOLN
2.0000 g | INTRAVENOUS | Status: DC
  Filled 2017-05-20 (×4): qty 2000

## 2017-05-20 MED ORDER — SODIUM CHLORIDE 0.9 % IV SOLN
20.0000 mg/kg | INTRAVENOUS | Status: AC
  Administered 2017-05-20: 1306 mg via INTRAVENOUS
  Filled 2017-05-20: qty 26.12

## 2017-05-20 MED ORDER — CHLORHEXIDINE GLUCONATE 0.12% ORAL RINSE (MEDLINE KIT)
15.0000 mL | Freq: Two times a day (BID) | OROMUCOSAL | Status: DC
  Administered 2017-05-20 – 2017-05-26 (×13): 15 mL via OROMUCOSAL

## 2017-05-20 MED ORDER — FENTANYL CITRATE (PF) 100 MCG/2ML IJ SOLN
100.0000 ug | INTRAMUSCULAR | Status: DC | PRN
  Administered 2017-05-20 – 2017-05-26 (×5): 100 ug via INTRAVENOUS

## 2017-05-20 MED ORDER — PROPOFOL 1000 MG/100ML IV EMUL
INTRAVENOUS | Status: AC
  Filled 2017-05-20: qty 100

## 2017-05-20 MED ORDER — DEXMEDETOMIDINE HCL 200 MCG/2ML IV SOLN
0.0000 ug/kg/h | INTRAVENOUS | Status: DC
  Filled 2017-05-20: qty 2

## 2017-05-20 MED ORDER — DEXTROSE 5 % IV SOLN
2.0000 g | Freq: Once | INTRAVENOUS | Status: AC
  Administered 2017-05-20: 2 g via INTRAVENOUS
  Filled 2017-05-20: qty 2

## 2017-05-20 NOTE — Progress Notes (Signed)
STAT EEG changed to LTM EEG per Dr Amada JupiterKirkpatrick, results pending.

## 2017-05-20 NOTE — Progress Notes (Signed)
EEG without clear seizure, but subtle discharges at C4, F4 which appear periodic at times, possibly PLEDs vs artifact. I will load with fosphenytoin and start on dilantin in addition to keppra.   Ritta SlotMcNeill Kirkpatrick, MD [facility] [phone]  If 7pm- 7am, please page neurology on call as listed in [place].

## 2017-05-20 NOTE — Progress Notes (Signed)
CRITICAL VALUE ALERT  Critical Value:  Yeast in CSF   Date & Time Notied:  05/20/17 1900  Provider Notified: Amada JupiterKirkpatrick  Orders Received/Actions taken: Notify infectious disease.

## 2017-05-20 NOTE — Progress Notes (Addendum)
Pharmacy Antibiotic Note  Justin Ball is a 26 y.o. male admitted on 05/20/2017 with meningitis.  Pharmacy has been consulted for vancomycin, acylcovir and ceftriaxone dosing. WBC is WNL and SCr is slightly above baseline. Lactic acid is significantly elevated at 6.64. Of note, pt with history of HIV and low CD4 count.   Plan: Vanc 1gm IV Q8H Ceftriaxone 2gm IV Q12H Acyclovir 10mg /kg IV Q8H F/u renal fxn, C&S, [facility]     No data recorded.   Recent Labs Lab 05/18/17 1941  WBC 3.6*  CREATININE 0.71    Estimated Creatinine Clearance: 125.7 mL/min (by C-G formula based on SCr of 0.71 mg/dL).    No Known Allergies  Antimicrobials this admission: Vanc 5/27>> CTX 5/27>> Acyclovir 5/27>>  Dose adjustments this admission: N/A  Microbiology results: Pending  Thank you for allowing pharmacy to be a part of this patient's care.  Rumbarger, Drake LeachRachel Lynn 05/20/2017 12:54 PM    Addendum - Add Ampicillin 2g/4h  Baldemar FridayMasters, Tedric Leeth M  05/20/2017 6:36 PM

## 2017-05-20 NOTE — ED Notes (Signed)
Patient pulled from car. Family member states that she thinks patient is dehydrated and [facility]. Patient with minimal verbal and fine tremors noted. 3 staff required to remove from car.

## 2017-05-20 NOTE — Progress Notes (Signed)
eLink Physician-Brief Progress Note Patient Name: Justin Ball DOB: 08/05/1991 MRN: [id number]   Date of Service  05/20/2017  HPI/Events of Note  HIV patient presented with seizures.  Required intubation in ED.  Markedly elevated pressure on LP.   Case discussed with ID by ED physician.    eICU Interventions  Seen by Dr Marylouise StacksPanchal in ED and admitted to ICU     Intervention Category Evaluation Type: New Patient Evaluation  [name], [name], [name] 05/20/2017, 6:38 PM

## 2017-05-20 NOTE — ED Notes (Signed)
Dr Erma HeritageIsaacs at bedside.  Stating need to intubate d/t fact that pt continues to seize.

## 2017-05-20 NOTE — Progress Notes (Signed)
eLink Physician-Brief Progress Note Patient Name: Justin Ball DOB: 02/15/1991 MRN: [id number]   Date of Service  05/20/2017  HPI/Events of Note  Tachycardia.  HR 150's and systolic bp 150.  Appears sinus.  eICU Interventions  Fentanyl 100 mcg IV in event the elevated HR is related to discomfort.  If no impact will try lopressor 5 mg IV x 1.     Intervention Category Intermediate Interventions: Other:;Arrhythmia - evaluation and management  [name], Anijah Spohr, P 05/20/2017, 10:21 PM

## 2017-05-20 NOTE — Consult Note (Signed)
Neurology Consultation Reason for Consult: Seizures Referring Physician: Erma HeritageIsaacs, C  CC: Seizures  History is obtained from: Patient  HPI: Justin Ball is a 26 y.o. male with a history of HIV/AIDS who presents with altered mental status.  He apparently Has been complaining of headache for the past few days. He was seen in the ER 3 days ago where CT scan was normal. He was afebrile and well-appearing at that time. Today, he was not feeling well and then became less responsive around 12 PM. He was seen to have seizures in the emergency department, but would arouse and speak in between seizures him a however he then had a seizure and was not protecting his airway requiring intubation.  He is currently very sedated from intubation.  ROS:  Unable to obtain due to altered mental status.   Past Medical History:  Diagnosis Date  . Anxiety   . Bipolar 1 disorder (HCC)   . Depression   . HIV infection (HCC)   . Neuromuscular disorder (HCC)   . Scabies   . 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 Substance abuse   . Syphilis      Family History  Problem Relation Age of Onset  . Hypertension Other      Social History:  reports that he has been smoking Cigarettes.  He has a 1.25 pack-year smoking history. He has never used smokeless tobacco. He reports that he drinks alcohol. He reports that he does not use drugs.   Exam: Current vital signs: BP 118/76   Pulse 78   Resp 16   Ht 6\' 1"  (1.854 m)   SpO2 100%  Vital signs in last 24 hours: Pulse Rate:  [78-142] 78 (05/27 1745) Resp:  [15-35] 16 (05/27 1745) BP: (107-167)/(67-113) 118/76 (05/27 1745) SpO2:  [93 %-100 %] 100 % (05/27 1745) FiO2 (%):  [60 %-100 %] 60 % (05/27 1650)   Physical Exam  Constitutional: Appears well-developed and well-nourished.  Psych: Affect appropriate to situation Eyes: No scleral injection HENT: No OP obstrucion Head: Normocephalic.  Cardiovascular: Normal rate and regular rhythm.  Respiratory:  Effort normal and breath sounds normal to anterior ascultation GI: Soft.  No distension. There is no tenderness.  Skin: WDI   Of note, he did receive sedation just prior to my examination, which could be clouding the picture. Neuro: Mental Status: Patient is comatose, does not arouse to noxious stimulation, Cranial Nerves: II: Does not blink to threat Pupils are equal, round, and reactive to light.  III,IV, VI: Doll's eye intact V:VII: Blinks to eyelid stimulus and bilaterally X: Cough intact Motor: No motor response to noxious stimulation in any extremity  Sensory: As above  Deep Tendon Reflexes: 2+ and symmetric in the biceps and patellae.  Cerebellar: Does not perform    I have reviewed labs in epic and the results pertinent to this consultation are: Hypokalemic, total protein is elevated but albumin is low  I have reviewed the images obtained: CT head with contrast negative  Impression: 26 year old male with suspected meningitis in the setting of immunocompromise. ED physician is in the process of performing an LP and he will be admitted to the critical care unit. Given that he is not returned to baseline, I will obtain a stat EEG to rule out ongoing status epilepticus.   Recommendations: 1) Keppra 1 g every 12 hours 2) agree with LP, appreciate ID assistance 3) stat EEG  Ritta SlotMcNeill Christella App, MD [facility] [phone]  If 7pm- 7am, please page  neurology on call as listed in [place].

## 2017-05-20 NOTE — ED Provider Notes (Signed)
[facility]-EMERGENCY DEPT Provider Note   CSN: [id number] Arrival date & time: 05/20/17  1227     History   Chief Complaint No chief complaint on file.   HPI Justin Ball is a 26 y.o. male.  HPI   26 year old male with history of HIV AIDS with history of noncompliance here with altered mental status. History is limited as patient was pulled out of his car unresponsive. Family did report that he thought the patient had a seizure. On arrival, patient is postictal and unable to provide history.  Per review of records, patient was seen 3 days ago in the ER for headache. He was afebrile and otherwise very well-appearing at that time. He had a CT scan which was negative and sent home with supportive care.  Level 5 caveat invoked as remainder of history, ROS, and physical exam limited due to patient's AMS.   Past Medical History:  Diagnosis Date  . Anxiety   . Bipolar 1 disorder (HCC)   . Depression   . HIV infection (HCC)   . Neuromuscular disorder (HCC)   . Scabies   . 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 Substance abuse   . Syphilis     Patient Active Problem List   Diagnosis Date Noted  . Headache 05/20/2017  . Seizure (HCC) 05/20/2017  . Leukopenia 03/06/2017  . Peri-rectal abscess 03/06/2017  . 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 Ulcer of soft palate 09/12/2016  . Acute hypokalemia 09/12/2016  . Anemia 09/12/2016  . Alcohol use 09/12/2016  . Oral abscess 09/12/2016  . Abscess   . AIDS (HCC) 01/19/2015  . 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 H/O noncompliance with medical treatment - finincial, presenting hazards to health 12/28/2014  . Rectal abscess 12/28/2014  . Intersphincteric abscess 12/28/2014  . Bipolar disorder (HCC) 04/08/2013  . Muscle ache 01/06/2013  . Lower back pain 01/06/2013  . Insomnia 01/06/2013  . HIV (human immunodeficiency virus infection) (HCC) 10/10/2012  . Rash and nonspecific skin eruption 10/10/2012  . Rectal discharge  10/10/2012  . Syphilis 10/10/2012    Past Surgical History:  Procedure Laterality Date  . EXAMINATION UNDER ANESTHESIA N/A 12/28/2014   Procedure: EXAM UNDER ANESTHESIA;  Surgeon: Karie Soda, MD;  Location: [facility];  Service: General;  Laterality: N/A;  . FLOOR OF MOUTH BIOPSY N/A 09/16/2016   Procedure: BIOPSY OF ORAL ABCESS;  Surgeon: Newman Pies, MD;  Location: [facility] OR;  Service: ENT;  Laterality: N/A;  . INCISION AND DRAINAGE ABSCESS N/A 09/16/2016   Procedure: INCISION AND DRAINAGE ABSCESS;  Surgeon: Newman Pies, MD;  Location: [facility] OR;  Service: ENT;  Laterality: N/A;  . INCISION AND DRAINAGE PERIRECTAL ABSCESS  08/27/2010   Dr Carolynne Edouard  . INCISION AND DRAINAGE PERIRECTAL ABSCESS N/A 12/28/2014   Procedure: IRRIGATION AND DEBRIDEMENT PERIRECTAL ABSCESS;  Surgeon: Karie Soda, MD;  Location: [facility];  Service: General;  Laterality: N/A;  Fistula repair and ablation       Home Medications    Prior to Admission medications   Medication Sig Start Date End Date Taking? Authorizing Provider  dolutegravir (TIVICAY) 50 MG tablet Take 1 tablet (50 mg total) by mouth daily. 04/18/17  Yes Randall Hiss, MD  emtricitabine-rilpivir-tenofovir AF (ODEFSEY) 200-25-25 MG TABS tablet Take 1 tablet by mouth daily with breakfast. 04/18/17  Yes [name], Lisette Grinder, MD  naproxen sodium (ANAPROX) 220 MG tablet Take 220 mg by mouth 2 (two) times daily as needed (pain).   Yes [provider]  sulfamethoxazole-trimethoprim (BACTRIM) 400-80 MG tablet Take 1 tablet by mouth daily. 04/18/17  Yes Daiva EvesVan Dam, Lisette Grinderornelius N, MD    Family History Family History  Problem Relation Age of Onset  . Hypertension Other     Social History Social History  Substance Use Topics  . Smoking status: Current Every Day Smoker    Packs/day: 0.25    Years: 5.00    Types: Cigarettes  . Smokeless tobacco: Never Used  . Alcohol use 0.0 oz/week     Comment: occasionally      Allergies   Patient has no known  allergies.   Review of Systems Review of Systems  Unable to perform ROS: Mental status change     Physical Exam Updated Vital Signs BP (!) 149/90   Pulse 99   Resp 16   Ht 6\' 1"  (1.854 m)   SpO2 100%   Physical Exam  Constitutional: He appears well-developed and well-nourished. He appears distressed.  HENT:  Head: Normocephalic.  Moderately dry mucous membranes  Eyes: Conjunctivae are normal.  Right pupil 5 mm and minimally reactive. Left pupil 3 mm and reactive to light. Left eye appears downward and outward. Disconjugate gaze.  Neck: Neck supple.  Cardiovascular: Regular rhythm and normal heart sounds.  Tachycardia present.  Exam reveals no friction rub.   No murmur heard. Pulmonary/Chest: Effort normal and breath sounds normal. No respiratory distress. He has no wheezes. He has no rales.  Abdominal: Soft. He exhibits no distension.  Musculoskeletal: He exhibits no edema.  Neurological:  Minimally responsive. Disconjugate gaze. Tone is flaccid in bilateral upper and lower extremities. No seizure-like activity at this time. Intact gag and corneal reflexes.  Skin: Skin is warm. Capillary refill takes less than 2 seconds.  Psychiatric: He has a normal mood and affect.  Nursing note and vitals reviewed.    ED Treatments / Results  Labs (all labs ordered are listed, but only abnormal results are displayed) Labs Reviewed  COMPREHENSIVE METABOLIC PANEL - Abnormal; Notable for the following:       Result Value   Potassium 2.7 (*)    Glucose, Bld 162 (*)    Total Protein 9.0 (*)    Albumin 3.0 (*)    AST 59 (*)    ALT <5 (*)    Alkaline Phosphatase 140 (*)    All other components within normal limits  CBC WITH DIFFERENTIAL/PLATELET - Abnormal; Notable for the following:    RBC 3.80 (*)    Hemoglobin 11.5 (*)    HCT 34.7 (*)    Lymphs Abs 0.3 (*)    Monocytes Absolute 1.3 (*)    All other components within normal limits  MAGNESIUM - Abnormal; Notable for the  following:    Magnesium 1.4 (*)    All other components within normal limits  I-STAT CG4 LACTIC ACID, ED - Abnormal; Notable for the following:    Lactic Acid, Venous 6.64 (*)    All other components within normal limits  I-STAT ARTERIAL BLOOD GAS, ED - Abnormal; Notable for the following:    pH, Arterial 7.561 (*)    pCO2 arterial 26.3 (*)    All other components within normal limits  CULTURE, BLOOD (ROUTINE X 2)  CULTURE, BLOOD (ROUTINE X 2)  CSF CULTURE  GRAM STAIN  HSV CULTURE AND TYPING  FUNGUS CULTURE, BLOOD  PROTIME-INR  URINALYSIS, ROUTINE W REFLEX MICROSCOPIC  TRIGLYCERIDES  CSF CELL COUNT WITH DIFFERENTIAL  CSF CELL COUNT WITH DIFFERENTIAL  GLUCOSE,  CSF  PROTEIN, CSF  HERPES SIMPLEX VIRUS(HSV) DNA BY PCR  CRYPTOCOCCAL ANTIGEN, CSF  VDRL, CSF  CBG MONITORING, ED  I-STAT CG4 LACTIC ACID, ED    EKG  EKG Interpretation None       Radiology Dg Chest 2 View  Result Date: 05/18/2017 CLINICAL DATA:  Weakness and vomiting for couple of days. Began as a migraine. Cough. History of HIV. Smoker. EXAM: CHEST  2 VIEW COMPARISON:  03/05/2017 FINDINGS: The heart size and mediastinal contours are within normal limits. Both lungs are clear. The visualized skeletal structures are unremarkable. IMPRESSION: No active cardiopulmonary disease. Electronically Signed   By: Burman Nieves M.D.   On: 05/18/2017 21:28   Ct Head Wo Contrast  Result Date: 05/18/2017 CLINICAL DATA:  Weakness and vomiting EXAM: CT HEAD WITHOUT CONTRAST TECHNIQUE: Contiguous axial images were obtained from the base of the skull through the vertex without intravenous contrast. COMPARISON:  09/15/2016 FINDINGS: Brain: No evidence of acute infarction, hemorrhage, hydrocephalus, extra-axial collection or mass lesion/mass effect. Vascular: No hyperdense vessel or unexpected calcification. Skull: Normal. Negative for fracture or focal lesion. Sinuses/Orbits: No acute finding. Other: None IMPRESSION: No CT evidence  for acute intracranial abnormality Electronically Signed   By: Jasmine Pang M.D.   On: 05/18/2017 21:58   Ct Head W Or Wo Contrast  Result Date: 05/20/2017 CLINICAL DATA:  New onset seizure.  History of HIV. EXAM: CT HEAD WITHOUT AND WITH CONTRAST TECHNIQUE: Contiguous axial images were obtained from the base of the skull through the vertex without and with intravenous contrast CONTRAST:  75 mL Isovue-300 COMPARISON:  Noncontrast head CT 05/18/2017 FINDINGS: Brain: There is no evidence of acute infarct, intracranial hemorrhage, mass, midline shift, or extra-axial fluid collection. Minimal prominence of the lateral ventricles for age is unchanged. A small amount of subtle hypoattenuation in the right internal capsule is unchanged and nonspecific. No abnormal enhancement is identified. Vascular: Visible vessels are patent. Skull: No fracture or focal osseous lesion. Sinuses/Orbits: No significant inflammatory disease in the imaged portions of the paranasal sinuses and mastoid air cells. Unremarkable orbits. Other: None. IMPRESSION: 1. No acute intracranial abnormality identified. 2. Minimal unchanged hypoattenuation in the right internal capsule, nonspecific. Electronically Signed   By: Sebastian Ache M.D.   On: 05/20/2017 15:29   Dg Chest Portable 1 View  Result Date: 05/20/2017 CLINICAL DATA:  Evaluate ETT placement EXAM: PORTABLE CHEST 1 VIEW COMPARISON:  May 18, 2017 FINDINGS: The ETT is in good position. The OG tube terminates below today's film. No pneumothorax. The lungs are clear. No other abnormalities. IMPRESSION: Support apparatus placement as above. Electronically Signed   By: Gerome Sam III M.D   On: 05/20/2017 14:36    Procedures .Critical Care Performed by: Shaune Pollack Authorized by: Shaune Pollack   Critical care provider statement:    Critical care time (minutes):  75   Critical care time was exclusive of:  Separately billable procedures and treating other patients and  teaching time   Critical care was necessary to treat or prevent imminent or life-threatening deterioration of the following conditions:  Circulatory failure, sepsis, dehydration and CNS failure or compromise   Critical care was time spent personally by me on the following activities:  Blood draw for specimens, development of treatment plan with patient or surrogate, discussions with consultants, evaluation of patient's response to treatment, examination of patient, obtaining history from patient or surrogate, ordering and review of laboratory studies, ordering and performing treatments and interventions, ordering and review of  radiographic studies, pulse oximetry, re-evaluation of patient's condition, review of old charts and ventilator management   I assumed direction of critical care for this patient from another provider in my specialty: no   .Lumbar Puncture Date/Time: 05/20/2017 5:04 PM Performed by: Shaune Pollack Authorized by: Shaune Pollack   Consent:    Consent obtained:  Verbal and emergent situation   Consent given by:  Parent   Risks discussed:  Bleeding, infection, pain, repeat procedure, nerve damage and headache   Alternatives discussed:  Alternative treatment and observation Pre-procedure details:    Procedure purpose:  Diagnostic   Preparation: Patient was prepped and draped in usual sterile fashion   Sedation:    Sedation type:  Deep Anesthesia (see MAR for exact dosages):    Anesthesia method:  Local infiltration   Local anesthetic:  Lidocaine 1% w/o epi Procedure details:    Lumbar space:  L3-L4 interspace   Patient position:  L lateral decubitus   Needle gauge:  22   Needle type:  Spinal needle - Quincke tip   Needle length (in):  3.5   Ultrasound guidance: no     Number of attempts:  1   Opening pressure (cm H2O):  55   Closing pressure (cm H2O):  20   Fluid appearance:  Clear   Tubes of fluid:  4 Post-procedure:    Puncture site:  Adhesive bandage applied  and direct pressure applied   Patient tolerance of procedure:  Tolerated well, no immediate complications Procedure Name: Intubation Date/Time: 05/20/2017 5:07 PM Performed by: Shaune Pollack Pre-anesthesia Checklist: Patient identified, Patient being monitored, Emergency Drugs available, Timeout performed and Suction available Oxygen Delivery Method: Non-rebreather mask Preoxygenation: Pre-oxygenation with 100% oxygen Intubation Type: IV induction Ventilation: Mask ventilation without difficulty Laryngoscope Size: Glidescope and 4 Grade View: Grade I Tube size: 7.5 mm Number of attempts: 1 Airway Equipment and Method: Video-laryngoscopy and Rigid stylet Placement Confirmation: ETT inserted through vocal cords under direct vision,  Positive ETCO2,  CO2 detector and Breath sounds checked- equal and bilateral Secured at: 21 cm Tube secured with: ETT holder Dental Injury: Teeth and Oropharynx as per pre-operative assessment  Future Recommendations: Recommend- induction with short-acting agent, and alternative techniques readily available      (including critical care time)  Medications Ordered in ED Medications  acyclovir (ZOVIRAX) 635 mg in dextrose 5 % 100 mL IVPB (635 mg Intravenous New Bag/Given 05/20/17 1431)  cefTRIAXone (ROCEPHIN) 2 g in dextrose 5 % 50 mL IVPB (not administered)  vancomycin (VANCOCIN) IVPB 1000 mg/200 mL premix (not administered)  etomidate (AMIDATE) injection 10 mg (not administered)  rocuronium (ZEMURON) injection 63.5 mg (not administered)  magnesium sulfate IVPB 2 g 50 mL (not administered)  potassium chloride 10 mEq in 100 mL IVPB (not administered)  iopamidol (ISOVUE-300) 61 % injection (not administered)  fentaNYL (SUBLIMAZE) injection 100 mcg (100 mcg Intravenous Given 05/20/17 1525)  fentaNYL (SUBLIMAZE) injection 100 mcg (not administered)  propofol (DIPRIVAN) 1000 MG/100ML infusion (50 mcg/kg/min  63.5 kg Intravenous Rate/Dose Change 05/20/17 1521)   lidocaine (PF) (XYLOCAINE) 1 % injection 5 mL (not administered)  amphotericin B liposome (AMBISOME) 250 mg in dextrose 5 % 500 mL IVPB (not administered)  flucytosine (ANCOBON) capsule 1,500 mg (not administered)  LORazepam (ATIVAN) injection 1 mg (1 mg Intravenous Given 05/20/17 1257)  sodium chloride 0.9 % bolus 1,000 mL (0 mLs Intravenous Stopped 05/20/17 1422)    And  sodium chloride 0.9 % bolus 1,000 mL (0 mLs Intravenous Stopped 05/20/17 1529)  dexamethasone (DECADRON) injection 10 mg (10 mg Intravenous Given 05/20/17 1257)  cefTRIAXone (ROCEPHIN) 2 g in dextrose 5 % 50 mL IVPB (0 g Intravenous Stopped 05/20/17 1327)  vancomycin (VANCOCIN) IVPB 1000 mg/200 mL premix (0 mg Intravenous Stopped 05/20/17 1420)  acetaminophen (OFIRMEV) IV 1,000 mg (0 mg Intravenous Stopped 05/20/17 1341)  LORazepam (ATIVAN) injection 1 mg (1 mg Intravenous Given 05/20/17 1335)  LORazepam (ATIVAN) injection 2 mg (2 mg Intravenous Given 05/20/17 1338)  levETIRAcetam (KEPPRA) 1,000 mg in sodium chloride 0.9 % 100 mL IVPB (0 mg Intravenous Stopped 05/20/17 1442)     Initial Impression / Assessment and Plan / ED Course  I have reviewed the triage vital signs and the nursing notes.  Pertinent labs & imaging results that were available during my care of the patient were reviewed by me and considered in my medical decision making (see chart for details).     26 year old male with history of HIV AIDS here with altered mental status in setting of possible seizure-like activity. Patient initially postictal then had witnessed generalized seizure activity. Patient subsequently intubated as above due to altered mental status. Patient febrile here as well. Concern for HIV AIDS with possible cryptococcal medicine Titus, herpes encephalitis, versus bacterial meningitis. Patient given Decadron, broad-spectrum antibiotics, and will sent for stat CT head. He has received Ativan as well as a Keppra load. Neurology has been consult it.  Will admit to the ICU.  CT head negative. Consult to neurology who recommends lumbar puncture. LP performed by myself. Opening pressure greater than 55. Patient closing pressure was 20 and vital signs improved. Heart rate now less than 100. He is satting well on the vent. ABG is pending. I discussed with infectious disease as well, who recommends amphotericin, flucytosine, and will evaluate the patient. There may also be a component of immune reconstitution according to ID Critical care aware.   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

## 2017-05-20 NOTE — Progress Notes (Signed)
CRITICAL VALUE ALERT  Critical Value:  Lactic Acid 2.3  Date & Time Notied:  05/20/17 2020  Provider Notified: Dr. Katrinka BlazingSmith Pola Corn([facility])  Orders Received/Actions taken: Notified MD

## 2017-05-20 NOTE — Progress Notes (Signed)
Pt transported on vent from ED to 4N28.  [name] vitals remained stable throughout.

## 2017-05-20 NOTE — ED Notes (Signed)
I Stat Lactic Acid results shown to Dr. Isaacs 

## 2017-05-20 NOTE — ED Notes (Signed)
Assisting MD in LP.

## 2017-05-20 NOTE — Progress Notes (Signed)
CRITICAL VALUE ALERT  Critical Value:  Lactic Acid 2.5  Date & Time Notied:  05/20/17 2230   Provider Notified: Dr. Cyndra NumbersSmith [facility]  Orders Received/Actions taken: Notified Physician

## 2017-05-20 NOTE — Progress Notes (Signed)
        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  I have stopped all of the anti-bacterial and anti-viral antibiotics  I have also stopped his ARV  He had VERY HIGH ICP with OP >55 cm H20  I AGREE WITH AMBISOME AND FLUCYTOSINE  HE SHOULD HAVE A REPEAT LP TOMORROW IF POSSIBLE AND EVERY FEW DAYS UNTIL WE ARE SEEING NORMAL ICP  HE IS ON AED  NOTE DILANTIN WILL BE AN ABSOLUTE DISASTER LONG TERM AS IT ESSENTIALLY INTERACTS WITH ALL ANTI-RETROVIRALS   He should have an AED plan involving  Drugs that do not interact with his ARV regimen which was JULUCA (dolutegravir/rilpivirine) and Prezcobix.  For now we are dcing these ARV unless we have evidence that he has already suppressed his virus in which case I would continue them BUT with steroids as well since this would be highly likely to be IRIS on top of crypto  Dr. Drue SecondSnider to take back over the service tomorrow.    Acey LavCornelius Van Dam 05/20/2017, 7:21 PM

## 2017-05-20 NOTE — H&P (Signed)
Justin Ball is an 26 y.o. male.   Chief Complaint: Headache, seizures HPI:   Patient intubated sedated so history obtained from ER physician  Patient has been having headache for past few days. He was seen in ER 2 days ago and discharged. Today he had witnessed seizures by family and repeat in ER. When brought in to ER, he was post ictal but did follow some commands. Then he had repeat seizures and was intubated. He had disconjugate eyes which resolved.   He has also been having fevers lately. Grandmother expressed concern of his compliance with HIV meds.   26 year old male with history of HIV AIDS with history of noncompliance here with altered mental status. History is limited as patient was pulled out of his car unresponsive. Family did report that he thought the patient had a seizure. On arrival, patient is postictal and unable to provide history.  Per review of records, patient was seen 3 days ago in the ER for headache. He was afebrile and otherwise very well-appearing at that time. He had a CT scan which was negative and sent home with supportive care.  Past Medical History:  Diagnosis Date  . Anxiety   . Bipolar 1 disorder ([place])   . Depression   . HIV infection ([place])   . Neuromuscular disorder ([place])   . Scabies   . 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 Substance abuse   . Syphilis     Past Surgical History:  Procedure Laterality Date  . EXAMINATION UNDER ANESTHESIA N/A 12/28/2014   Procedure: EXAM UNDER ANESTHESIA;  Surgeon: Michael Boston, MD;  Location: [facility];  Service: General;  Laterality: N/A;  . FLOOR OF MOUTH BIOPSY N/A 09/16/2016   Procedure: BIOPSY OF ORAL ABCESS;  Surgeon: Leta Baptist, MD;  Location: [facility];  Service: ENT;  Laterality: N/A;  . INCISION AND DRAINAGE ABSCESS N/A 09/16/2016   Procedure: INCISION AND DRAINAGE ABSCESS;  Surgeon: Leta Baptist, MD;  Location: [facility];  Service: ENT;  Laterality: N/A;  . INCISION AND DRAINAGE PERIRECTAL ABSCESS  08/27/2010   Dr Marlou Starks  .  INCISION AND DRAINAGE PERIRECTAL ABSCESS N/A 12/28/2014   Procedure: IRRIGATION AND DEBRIDEMENT PERIRECTAL ABSCESS;  Surgeon: Michael Boston, MD;  Location: [facility];  Service: General;  Laterality: N/A;  Fistula repair and ablation    Family History  Problem Relation Age of Onset  . Hypertension Other    Social History:  reports that he has been smoking Cigarettes.  He has a 1.25 pack-year smoking history. He has never used smokeless tobacco. He reports that he drinks alcohol. He reports that he does not use drugs.  Allergies: No Known Allergies   (Not in a hospital admission)  Results for orders placed or performed during the hospital encounter of 05/20/17 (from the past 48 hour(s))  Comprehensive metabolic panel     Status: Abnormal   Collection Time: 05/20/17  1:08 PM  Result Value Ref Range   Sodium 137 135 - 145 mmol/L   Potassium 2.7 (LL) 3.5 - 5.1 mmol/L    Comment: CRITICAL RESULT CALLED TO, READ BACK BY AND VERIFIED WITH: B.ROWE,RN 05/20/17 _0  BY V.WILKINS    Chloride 102 101 - 111 mmol/L   CO2 23 22 - 32 mmol/L   Glucose, Bld 162 (H) 65 - 99 mg/dL   BUN 7 6 - 20 mg/dL   Creatinine, Ser 1.01 0.61 - 1.24 mg/dL   Calcium 9.6 8.9 - 10.3 mg/dL   Total Protein 9.0 (H) 6.5 -  8.1 g/dL   Albumin 3.0 (L) 3.5 - 5.0 g/dL   AST 59 (H) 15 - 41 U/L   ALT <5 (L) 17 - 63 U/L   Alkaline Phosphatase 140 (H) 38 - 126 U/L   Total Bilirubin 0.8 0.3 - 1.2 mg/dL   GFR calc non Af Amer >60 >60 mL/min   GFR calc Af Amer >60 >60 mL/min    Comment: (NOTE) The eGFR has been calculated using the CKD EPI equation. This calculation has not been validated in all clinical situations. eGFR's persistently <60 mL/min signify possible Chronic Kidney Disease.    Anion gap 12 5 - 15  CBC WITH DIFFERENTIAL     Status: Abnormal   Collection Time: 05/20/17  1:08 PM  Result Value Ref Range   WBC 7.1 4.0 - 10.5 K/uL   RBC 3.80 (L) 4.22 - 5.81 MIL/uL   Hemoglobin 11.5 (L) 13.0 - 17.0 g/dL   HCT 34.7 (L)  39.0 - 52.0 %   MCV 91.3 78.0 - 100.0 fL   MCH 30.3 26.0 - 34.0 pg   MCHC 33.1 30.0 - 36.0 g/dL   RDW 13.4 11.5 - 15.5 %   Platelets 383 150 - 400 K/uL   Neutrophils Relative % 78 %   Lymphocytes Relative 4 %   Monocytes Relative 18 %   Eosinophils Relative 0 %   Basophils Relative 0 %   Neutro Abs 5.5 1.7 - 7.7 K/uL   Lymphs Abs 0.3 (L) 0.7 - 4.0 K/uL   Monocytes Absolute 1.3 (H) 0.1 - 1.0 K/uL   Eosinophils Absolute 0.0 0.0 - 0.7 K/uL   Basophils Absolute 0.0 0.0 - 0.1 K/uL   WBC Morphology MILD LEFT SHIFT (1-5% METAS, OCC MYELO, OCC BANDS)   Magnesium     Status: Abnormal   Collection Time: 05/20/17  1:08 PM  Result Value Ref Range   Magnesium 1.4 (L) 1.7 - 2.4 mg/dL  Protime-INR     Status: None   Collection Time: 05/20/17  1:08 PM  Result Value Ref Range   Prothrombin Time 14.6 11.4 - 15.2 seconds   INR 1.13   I-Stat CG4 Lactic Acid, ED  (not at  [facility])     Status: Abnormal   Collection Time: 05/20/17  1:25 PM  Result Value Ref Range   Lactic Acid, Venous 6.64 (HH) 0.5 - 1.9 mmol/L   Comment NOTIFIED PHYSICIAN    Dg Chest 2 View  Result Date: 05/18/2017 CLINICAL DATA:  Weakness and vomiting for couple of days. Began as a migraine. Cough. History of HIV. Smoker. EXAM: CHEST  2 VIEW COMPARISON:  03/05/2017 FINDINGS: The heart size and mediastinal contours are within normal limits. Both lungs are clear. The visualized skeletal structures are unremarkable. IMPRESSION: No active cardiopulmonary disease. Electronically Signed   By: Lucienne Capers M.D.   On: 05/18/2017 21:28   Ct Head Wo Contrast  Result Date: 05/18/2017 CLINICAL DATA:  Weakness and vomiting EXAM: CT HEAD WITHOUT CONTRAST TECHNIQUE: Contiguous axial images were obtained from the base of the skull through the vertex without intravenous contrast. COMPARISON:  09/15/2016 FINDINGS: Brain: No evidence of acute infarction, hemorrhage, hydrocephalus, extra-axial collection or mass lesion/mass effect. Vascular: No  hyperdense vessel or unexpected calcification. Skull: Normal. Negative for fracture or focal lesion. Sinuses/Orbits: No acute finding. Other: None IMPRESSION: No CT evidence for acute intracranial abnormality Electronically Signed   By: Donavan Foil M.D.   On: 05/18/2017 21:58   Ct Head W Or Wo Contrast  Result Date: 05/20/2017 CLINICAL DATA:  New onset seizure.  History of HIV. EXAM: CT HEAD WITHOUT AND WITH CONTRAST TECHNIQUE: Contiguous axial images were obtained from the base of the skull through the vertex without and with intravenous contrast CONTRAST:  75 mL Isovue-300 COMPARISON:  Noncontrast head CT 05/18/2017 FINDINGS: Brain: There is no evidence of acute infarct, intracranial hemorrhage, mass, midline shift, or extra-axial fluid collection. Minimal prominence of the lateral ventricles for age is unchanged. A small amount of subtle hypoattenuation in the right internal capsule is unchanged and nonspecific. No abnormal enhancement is identified. Vascular: Visible vessels are patent. Skull: No fracture or focal osseous lesion. Sinuses/Orbits: No significant inflammatory disease in the imaged portions of the paranasal sinuses and mastoid air cells. Unremarkable orbits. Other: None. IMPRESSION: 1. No acute intracranial abnormality identified. 2. Minimal unchanged hypoattenuation in the right internal capsule, nonspecific. Electronically Signed   By: Logan Bores M.D.   On: 05/20/2017 15:29   Dg Chest Portable 1 View  Result Date: 05/20/2017 CLINICAL DATA:  Evaluate ETT placement EXAM: PORTABLE CHEST 1 VIEW COMPARISON:  May 18, 2017 FINDINGS: The ETT is in good position. The OG tube terminates below today's film. No pneumothorax. The lungs are clear. No other abnormalities. IMPRESSION: Support apparatus placement as above. Electronically Signed   By: Dorise Bullion III M.D   On: 05/20/2017 14:36    Review of Systems  Unable to perform ROS: Intubated    Blood pressure (!) 149/90, pulse 99, resp.  rate 16, height _0  (1.854 m), SpO2 100 %. Physical Exam  Vitals reviewed. Constitutional: He appears well-developed.  HENT:  Head: Normocephalic and atraumatic.  Eyes: Conjunctivae are normal. Right eye exhibits no discharge. Left eye exhibits no discharge.  Neck: Neck supple. No tracheal deviation present. Thyromegaly present.  Cardiovascular: Normal rate, regular rhythm and normal heart sounds.   Respiratory: Breath sounds normal.  GI: Soft. Bowel sounds are normal. He exhibits no distension. There is no tenderness. There is no rebound.  Musculoskeletal: He exhibits no edema or tenderness.  Neurological:  Intubated sedated. See neuro note for details.   Skin: Skin is dry.     Assessment/Plan  Admit to icu Initiate critical care protocols Lung protective ventilation abx per ID Sepsis bundle Await LP results.  Neuro is already consulted and seen the patient. Gi and DVT Px CT head negative for acute findings CXR clear Hypokalemia - replace ER  Physician will be calling ID consult   D/w patient mother and grandmother. They know about his HIV status but his other siblings dont know.  The patient is critically ill with multiple organ systems failure and requires high complexity decision making for assessment and support, frequent evaluation and titration of therapies, application of advanced monitoring technologies and extensive interpretation of multiple databases.   Critical Care Time devoted to patient care services described in this note is 40  Minutes. This time reflects time of care of this signee: Sharia Reeve, MD.    Sharia Reeve, MD 05/20/2017, 4:32 PM

## 2017-05-21 ENCOUNTER — Inpatient Hospital Stay (HOSPITAL_COMMUNITY): Payer: No Typology Code available for payment source

## 2017-05-21 ENCOUNTER — Encounter (HOSPITAL_COMMUNITY): Payer: Self-pay

## 2017-05-21 DIAGNOSIS — G932 Benign intracranial hypertension: Secondary | ICD-10-CM

## 2017-05-21 DIAGNOSIS — H5704 Mydriasis: Secondary | ICD-10-CM

## 2017-05-21 DIAGNOSIS — F17210 Nicotine dependence, cigarettes, uncomplicated: Secondary | ICD-10-CM

## 2017-05-21 DIAGNOSIS — F319 Bipolar disorder, unspecified: Secondary | ICD-10-CM

## 2017-05-21 DIAGNOSIS — R569 Unspecified convulsions: Secondary | ICD-10-CM

## 2017-05-21 DIAGNOSIS — B20 Human immunodeficiency virus [HIV] disease: Secondary | ICD-10-CM

## 2017-05-21 DIAGNOSIS — J9601 Acute respiratory failure with hypoxia: Secondary | ICD-10-CM

## 2017-05-21 DIAGNOSIS — Z9911 Dependence on respirator [ventilator] status: Secondary | ICD-10-CM

## 2017-05-21 DIAGNOSIS — Z8249 Family history of ischemic heart disease and other diseases of the circulatory system: Secondary | ICD-10-CM

## 2017-05-21 DIAGNOSIS — R7881 Bacteremia: Secondary | ICD-10-CM

## 2017-05-21 LAB — CBC
HEMATOCRIT: 34.4 % — AB (ref 39.0–52.0)
HEMOGLOBIN: 11.7 g/dL — AB (ref 13.0–17.0)
MCH: 31 pg (ref 26.0–34.0)
MCHC: 34 g/dL (ref 30.0–36.0)
MCV: 91.2 fL (ref 78.0–100.0)
Platelets: 279 10*3/uL (ref 150–400)
RBC: 3.77 MIL/uL — ABNORMAL LOW (ref 4.22–5.81)
RDW: 13.9 % (ref 11.5–15.5)
WBC: 5.6 10*3/uL (ref 4.0–10.5)

## 2017-05-21 LAB — BLOOD CULTURE ID PANEL (REFLEXED)
ACINETOBACTER BAUMANNII: NOT DETECTED
CANDIDA ALBICANS: NOT DETECTED
CANDIDA TROPICALIS: NOT DETECTED
Candida glabrata: NOT DETECTED
Candida krusei: NOT DETECTED
Candida parapsilosis: NOT DETECTED
ENTEROBACTERIACEAE SPECIES: NOT DETECTED
ENTEROCOCCUS SPECIES: NOT DETECTED
Enterobacter cloacae complex: NOT DETECTED
Escherichia coli: NOT DETECTED
HAEMOPHILUS INFLUENZAE: NOT DETECTED
KLEBSIELLA PNEUMONIAE: NOT DETECTED
Klebsiella oxytoca: NOT DETECTED
LISTERIA MONOCYTOGENES: NOT DETECTED
NEISSERIA MENINGITIDIS: NOT DETECTED
Proteus species: NOT DETECTED
Pseudomonas aeruginosa: NOT DETECTED
SERRATIA MARCESCENS: NOT DETECTED
STAPHYLOCOCCUS AUREUS BCID: NOT DETECTED
STREPTOCOCCUS AGALACTIAE: NOT DETECTED
Staphylococcus species: NOT DETECTED
Streptococcus pneumoniae: DETECTED — AB
Streptococcus pyogenes: NOT DETECTED
Streptococcus species: DETECTED — AB

## 2017-05-21 LAB — BASIC METABOLIC PANEL
Anion gap: 8 (ref 5–15)
BUN: 6 mg/dL (ref 6–20)
CHLORIDE: 108 mmol/L (ref 101–111)
CO2: 22 mmol/L (ref 22–32)
CREATININE: 0.72 mg/dL (ref 0.61–1.24)
Calcium: 9 mg/dL (ref 8.9–10.3)
GFR calc non Af Amer: >60 mL/min (ref >60)
Glucose, Bld: 103 mg/dL — ABNORMAL HIGH (ref 65–99)
Potassium: 3.2 mmol/L — ABNORMAL LOW (ref 3.5–5.1)
Sodium: 138 mmol/L (ref 135–145)

## 2017-05-21 LAB — GLUCOSE, CAPILLARY
GLUCOSE-CAPILLARY: 106 mg/dL — AB (ref 65–99)
GLUCOSE-CAPILLARY: 191 mg/dL — AB (ref 65–99)
Glucose-Capillary: 128 mg/dL — ABNORMAL HIGH (ref 65–99)
Glucose-Capillary: 135 mg/dL — ABNORMAL HIGH (ref 65–99)
Glucose-Capillary: 137 mg/dL — ABNORMAL HIGH (ref 65–99)
Glucose-Capillary: 80 mg/dL (ref 65–99)

## 2017-05-21 LAB — PHOSPHORUS
PHOSPHORUS: 3.6 mg/dL (ref 2.5–4.6)
Phosphorus: 3.6 mg/dL (ref 2.5–4.6)
Phosphorus: 3 mg/dL (ref 2.5–4.6)

## 2017-05-21 LAB — VDRL, CSF: SYPHILIS VDRL QUANT CSF: NONREACTIVE

## 2017-05-21 LAB — BLOOD GAS, ARTERIAL
Acid-base deficit: 0.3 mmol/L (ref 0.0–2.0)
BICARBONATE: 22.1 mmol/L (ref 20.0–28.0)
Drawn by: 28340
FIO2: 60
LHR: 16 {breaths}/min
MECHVT: 640 mL
O2 Saturation: 99.5 %
PATIENT TEMPERATURE: 98.6
PCO2 ART: 26.3 mmHg — AB (ref 32.0–48.0)
PEEP/CPAP: 5 cmH2O
PO2 ART: 219 mmHg — AB (ref 83.0–108.0)
pH, Arterial: 7.535 — ABNORMAL HIGH (ref 7.350–7.450)

## 2017-05-21 LAB — HERPES SIMPLEX VIRUS(HSV) DNA BY PCR
HSV 1 DNA: NEGATIVE
HSV 2 DNA: NEGATIVE

## 2017-05-21 LAB — MAGNESIUM
MAGNESIUM: 1.7 mg/dL (ref 1.7–2.4)
Magnesium: 1.9 mg/dL (ref 1.7–2.4)
Magnesium: 1.8 mg/dL (ref 1.7–2.4)

## 2017-05-21 LAB — CRYPTOCOCCAL ANTIGEN, CSF
CRYPTO AG: POSITIVE — AB
Cryptococcal Ag Titer: >2560 — AB

## 2017-05-21 MED ORDER — DEXAMETHASONE SODIUM PHOSPHATE 4 MG/ML IJ SOLN
4.0000 mg | INTRAMUSCULAR | Status: DC
  Administered 2017-05-21 – 2017-05-23 (×12): 4 mg via INTRAVENOUS
  Filled 2017-05-21 (×12): qty 1

## 2017-05-21 MED ORDER — VANCOMYCIN HCL IN DEXTROSE 1-5 GM/200ML-% IV SOLN
1000.0000 mg | Freq: Three times a day (TID) | INTRAVENOUS | Status: DC
  Administered 2017-05-21: 1000 mg via INTRAVENOUS
  Filled 2017-05-21 (×2): qty 200

## 2017-05-21 MED ORDER — MANNITOL 25 % IV SOLN
INTRAVENOUS | Status: AC
  Filled 2017-05-21: qty 50

## 2017-05-21 MED ORDER — MANNITOL 25 % IV SOLN
35.0000 g | INTRAVENOUS | Status: AC
  Administered 2017-05-21: 35 g via INTRAVENOUS
  Filled 2017-05-21: qty 100

## 2017-05-21 MED ORDER — DEXAMETHASONE SODIUM PHOSPHATE 10 MG/ML IJ SOLN: 10.0000 mg | Freq: Four times a day (QID) | INTRAMUSCULAR | Status: DC

## 2017-05-21 MED ORDER — VITAL AF 1.2 CAL PO LIQD
1000.0000 mL | ORAL | Status: DC
  Administered 2017-05-21 – 2017-05-23 (×4): 1000 mL
  Filled 2017-05-21: qty 1000

## 2017-05-21 MED ORDER — DEXTROSE 5 % IV SOLN
2.0000 g | Freq: Two times a day (BID) | INTRAVENOUS | Status: DC
  Administered 2017-05-21 – 2017-05-25 (×9): 2 g via INTRAVENOUS
  Filled 2017-05-21 (×11): qty 2

## 2017-05-21 MED ORDER — PRO-STAT SUGAR FREE PO LIQD
30.0000 mL | Freq: Two times a day (BID) | ORAL | Status: DC
  Administered 2017-05-21 – 2017-05-24 (×7): 30 mL
  Filled 2017-05-21 (×7): qty 30

## 2017-05-21 MED ORDER — VITAL HIGH PROTEIN PO LIQD: 1000.0000 mL | ORAL | Status: DC

## 2017-05-21 NOTE — Consult Note (Signed)
CC:  Cryptococcal meningitis  HPI: Justin Ball is a 26 y.o. male admitted with HA and altered mental status with hx of HIV. May have been having SZ upon arrival, intubated for airway protection. LP demonstrated  and LP c/w cryptococcal meningitis. This am was found to have large unreactive pupil on right. Was taken for stat ct head and given mannitol. I was called for likelihood of severe intracranial hypertension/herniation syndrome.  PMH: Past Medical History:  Diagnosis Date  . Anxiety   . Bipolar 1 disorder ([facility])   . Depression   . HIV infection ([facility])   . Neuromuscular disorder ([facility])   . Scabies   . 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 Substance abuse   . Syphilis     PSH: Past Surgical History:  Procedure Laterality Date  . EXAMINATION UNDER ANESTHESIA N/A 12/28/2014   Procedure: EXAM UNDER ANESTHESIA;  Surgeon: Karie SodaSteven Gross, MD;  Location: [facility];  Service: General;  Laterality: N/A;  . FLOOR OF MOUTH BIOPSY N/A 09/16/2016   Procedure: BIOPSY OF ORAL ABCESS;  Surgeon: Newman PiesSu Teoh, MD;  Location: [facility];  Service: ENT;  Laterality: N/A;  . INCISION AND DRAINAGE ABSCESS N/A 09/16/2016   Procedure: INCISION AND DRAINAGE ABSCESS;  Surgeon: Newman PiesSu Teoh, MD;  Location: [facility];  Service: ENT;  Laterality: N/A;  . INCISION AND DRAINAGE PERIRECTAL ABSCESS  08/27/2010   Dr Carolynne Edouardoth  . INCISION AND DRAINAGE PERIRECTAL ABSCESS N/A 12/28/2014   Procedure: IRRIGATION AND DEBRIDEMENT PERIRECTAL ABSCESS;  Surgeon: Karie SodaSteven Gross, MD;  Location: [facility];  Service: General;  Laterality: N/A;  Fistula repair and ablation    SH: Social History  Substance Use Topics  . Smoking status: Current Every Day Smoker    Packs/day: 0.25    Years: 5.00    Types: Cigarettes  . Smokeless tobacco: Never Used  . Alcohol use 0.0 oz/week     Comment: occasionally     MEDS: Prior to Admission medications   Medication Sig Start Date End Date Taking? Authorizing Provider  dolutegravir (TIVICAY) 50 MG tablet Take 1 tablet  (50 mg total) by mouth daily. 04/18/17  Yes Randall HissVan Dam, [name], MD  emtricitabine-rilpivir-tenofovir AF (ODEFSEY) 200-25-25 MG TABS tablet Take 1 tablet by mouth daily with breakfast. 04/18/17  Yes Daiva EvesVan Dam, Lisette Grinderornelius N, MD  naproxen sodium (ANAPROX) 220 MG tablet Take 220 mg by mouth 2 (two) times daily as needed (pain).   Yes [provider]  sulfamethoxazole-trimethoprim (BACTRIM) 400-80 MG tablet Take 1 tablet by mouth daily. 04/18/17  Yes Daiva EvesVan Dam, Lisette Grinderornelius N, MD    ALLERGY: No Known Allergies  ROS: ROS  NEUROLOGIC EXAM: Off propofol: No eye opening to pain Right pupil 3mm, non reactive, Left pupil 3mm reactive (-) corneal reflex bilaterally (+) cough Extensor posturing BUE  IMGAING: CT head reviewed, no hemorrhage, normal ventricular size, no sulcal effacement. No uncal/transtentorial/tonsillar herniation seen.  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  PLAN: - Will place EVD at bedside  I have reviewed the situation with the patient's family over the phone. My recommendation for EVD was discussed. All questions were answered and consent was obtained.

## 2017-05-21 NOTE — Progress Notes (Signed)
eLink Physician-Brief Progress Note Patient Name: Justin SpurrLinston R Ball DOB: 06/27/1991 MRN: [id number]   Date of Service  05/21/2017  HPI/Events of Note  Strep pneumo bacteremia and likely meningitis: Reviewed literature more and discussed with pharmacy  Decadron associated with higher mortality in HIV patients with cryptococcus;  Vanc indicated until we know drug sensitivities  eICU Interventions  Hold decadron (given one dose yesterday prior to antibiotics) Add vanc Will defer to ID for further med adjustment     Intervention Category Major Interventions: Infection - evaluation and management  Max FickleDouglas McQuaid 05/21/2017, 5:43 AM

## 2017-05-21 NOTE — Procedures (Signed)
  Video EEG Monitoring Report    Dates of monitoring:   05/20/17 @18 :23 to 05/20/17 @07 :30  Recording day:    1 (started on 5/27) EEG Number:    [id number] Requesting provider:   Ritta SlotMcNeill Kirkpatrick, MD Interpreting physician:  Wynelle Bourgeoisan-Andrei Candus Braud, MD  CPT:  [phone] ICD-10:   R56.9   Present History39: 26 y.o. male with a history of HIV/AIDS who presents with altered mental status. He was seen to have seizures in the emergency department, then had a seizure and was not protecting his airway requiring intubation. 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 Continuous VEEG requested to evaluate for seizures.    EEG Classification Abnormal, Coma There is no PDR HR 90-100 bpm and regular   Non-epileptiform abnormalities: 1. Background suppression --> Continuous slow, generalized   Interictal epileptiform abnormalities: none  Ictal phenomena:  none  EEG DETAILS:  TYPE OF RECORDING: At least 18-channel digital EEG with using a standard international 10-20 placement with additional EEG electrodes, and 1 additional channel dedicated to the EKG, at a sampling rate of at least 256 Hz. Video was recorded throughout the study. The recording was interpreted using digital review software allowing for montage reformatting, gain and filter changes. Each page was reviewed manually. Automatic spike and seizure detection software and quantitative analysis tools were used as needed.   Description of EEG features: The recording reveals a very low amplitude (<5 uV), continuous, poorly variable and non-reactive diffuse slow background with intermittent  low amplitude (~15 uV) sinusoid theta and intermittent, spindly low amplitude bursts of beta which are maximum bi-fronto-central.  The left hemisphere appears to generate slightly higher amplitudes, but care should be exercised interpreting asymmetries in  very low amplitude recordings at high  gain.  Over the course of the recording, and especially after 05:00, the background amplitude increases to some extent so that a more continuous low amplitude delta-theta background  is present. This remains poorly variable and non-reactive.   Interpretation: This EEG is indicative of a severe diffuse encephalopathy, but non-specific as to etiology. Sedating medication effects cannot be ruled out as contributory. There is a slight improvement in background over the course of the recording.   No seizures, epileptiform discharges or unequivocal lateralizing signs are seen. There is a suggestion of the right hemisphere dysfunction being more severe.

## 2017-05-21 NOTE — Progress Notes (Signed)
PHARMACY - PHYSICIAN COMMUNICATION CRITICAL VALUE ALERT - BLOOD CULTURE IDENTIFICATION (BCID)  Results for orders placed or performed during the hospital encounter of 05/20/17  Blood Culture ID Panel (Reflexed) (Collected: 05/20/2017 12:55 PM)  Result Value Ref Range   Enterococcus species NOT DETECTED NOT DETECTED   Listeria monocytogenes NOT DETECTED NOT DETECTED   Staphylococcus species NOT DETECTED NOT DETECTED   Staphylococcus aureus NOT DETECTED NOT DETECTED   Streptococcus species DETECTED (A) NOT DETECTED   Streptococcus agalactiae NOT DETECTED NOT DETECTED   Streptococcus pneumoniae DETECTED (A) NOT DETECTED   Streptococcus pyogenes NOT DETECTED NOT DETECTED   Acinetobacter baumannii NOT DETECTED NOT DETECTED   Enterobacteriaceae species NOT DETECTED NOT DETECTED   Enterobacter cloacae complex NOT DETECTED NOT DETECTED   Escherichia coli NOT DETECTED NOT DETECTED   Klebsiella oxytoca NOT DETECTED NOT DETECTED   Klebsiella pneumoniae NOT DETECTED NOT DETECTED   Proteus species NOT DETECTED NOT DETECTED   Serratia marcescens NOT DETECTED NOT DETECTED   Haemophilus influenzae NOT DETECTED NOT DETECTED   Neisseria meningitidis NOT DETECTED NOT DETECTED   Pseudomonas aeruginosa NOT DETECTED NOT DETECTED   Candida albicans NOT DETECTED NOT DETECTED   Candida glabrata NOT DETECTED NOT DETECTED   Candida krusei NOT DETECTED NOT DETECTED   Candida parapsilosis NOT DETECTED NOT DETECTED   Candida tropicalis NOT DETECTED NOT DETECTED    Name of physician (or Provider) Contacted:   Dr. Kendrick FriesMcQuaid  Changes to prescribed antibiotics required:   Restart Rocephin 2 IV q12h due to possible CNS involvement.  F/U CSF fluid final cultures    Eddie Candlebbott, Raun Routh [place] 05/21/2017  5:15 AM

## 2017-05-21 NOTE — Progress Notes (Addendum)
Subjective: Called regarding fixed and dilated pupil. On exam, he had a fixed dilated pupil on the right. He was given mannitol and taken for a stat head CT. Following mannitol administration, his pupil was no longer dilated, but still unreactive and he was extensor posturing.   Exam: Vitals:   05/21/17 0700 05/21/17 0829  BP: (!) 122/92 (!) 125/92  Pulse: 97 (!) 102  Resp: 16   Temp: 97.5 F (36.4 C)    Gen: In bed, NAD Resp: non-labored breathing, no acute distress Abd: soft, nt  Neuro: MS: Does not open eyes or follow commands CN: Right pupil initially fixed and dilated, subsequently 3-4 mm but sluggish, left pupil is 3 mm and reactive. Corneals weak on the right, present on the left.  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 With his CNS infection, and bacteremia, I would be concerned that maybe he has bacterial involvement in his CSF is well, cultures are pending. Though the exact mechanism behind his appearance is unclear, he  very much has the appearance of someone with uncal herniation, but his CT does not support this.    EEG with no seizures or epileptiform discharges, I think that we can scaled back to a single antiepileptic agent. We'll discontinue Dilantin at this time.   Recommendations: 1) continue Keppra 1 g twice a day 2) appreciate neurosurgery and ID assistance 3) can dc LTM EEG 4) d/c dilantin 5) antifungals/anti-microbials per ID  This patient is critically ill and at significant risk of neurological worsening, death and care requires constant monitoring of vital signs, hemodynamics,respiratory and cardiac monitoring, neurological assessment, discussion with family, other specialists and medical decision making of high complexity. I spent 50 minutes of neurocritical  care time  in the care of  this patient.  Ritta SlotMcNeill Sonnet Rizor, MD [facility] [phone]  If 7pm- 7am, please page neurology on call as listed in AMION. 05/21/2017  10:59 AM

## 2017-05-21 NOTE — Consult Note (Addendum)
[facility]ious Disease  Total days of antibiotics 2        Day 2 L-ampho/flucytosie        Day 2 ceftriaxone        Day 2 vanco       Reason for Consult: cryptococcal meningitis   Referring Physician: sood  Principal Problem:   Seizure ([place]) Active Problems:   HIV (human immunodeficiency virus infection) ([place])   Rash and nonspecific skin eruption   Insomnia   Bipolar disorder ([place])   Intersphincteric abscess   AIDS ([place])   Headache    HPI: Justin Ball is a 26 y.o. male  Hx of advanced HIV disease-chronic hepatitis b, bipolar disorder, CD 4 count of 10(2%) /detectable VL starting back on his ART and last seen in the ID clinic in late April 2018, with regimen changed to tivicay-descovy plus SS bactrim for OI proph. The patient presented originally to the ED on 5/25 with complaint of 3 d hx of HA/N/V but no nuchal rigidity nor fevers. He responded to headache cocktail given in the ED and asked to follow up in the ID clinic. Roughly 48hr later, his family brought him in unresponsive, with tremors, and reported decreased PO intake on 3/27. He was found solomnence with periods of being aroused though with witness seizure activity. He had state EEG and started on anti-epileptics. His WBC was 7.1 with mild left shift increased from 3.6 from 2 days prior. His LA was 6.64 which prompted the initiation of abtx covering for meningitis. He was started on vancomycin, ceftriaxone, and acyclovir. He was intubated due to concern for unable to protect airway. He underwent LP on 5/27 high opening pressure of 55, WBC 14 with lymphocytic predominance, TP 39,glu 62. Csf cr Ag 1:2560+. 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 In the meantime, his blood cx grew strep. Pneumonaie. Stat EEG possibly showed PLEDs but not active  seizure.Given that this could be IRIS, his ART has been stopped in the setting of such elevated high OP. This morning during dr kirkpatrick's exam, the patient was posturing with right fixed pupil, concerning for excess elevated ICH, given mannitol and emergent evaluation by NSGY, where dr Kathyrn Sheriff took patient to surgery for R forntal ventriculostomy. He remains afebrile and on ventilator.  I have reviewed NCHCT and chest xray - no signs of pulmonary infiltrate. No signs of bleed on NCHCT  HPi obtained through chart review  Past Medical History:  Diagnosis Date  . Anxiety   . Bipolar 1 disorder ([place])   . Depression   . HIV infection ([place])   . Neuromuscular disorder ([place])   . Scabies   . 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 Substance abuse   . Syphilis     Allergies: No Known Allergies   MEDICATIONS: . acetaminophen  650 mg Oral Q24H  . chlorhexidine gluconate (MEDLINE KIT)  15 mL Mouth Rinse BID  . diphenhydrAMINE  25 mg Oral Q24H  . feeding supplement (PRO-STAT SUGAR FREE 64)  30 mL Per Tube BID  . flucytosine  1,500 mg Per Tube Q6H  . insulin aspart  2-6 Units Subcutaneous Q4H  . mouth rinse  15 mL Mouth Rinse 10 times per day  . sulfamethoxazole-trimethoprim  10 mL Per Tube Daily    Social History  Substance  Use Topics  . Smoking status: Current Every Day Smoker    Packs/day: 0.25    Years: 5.00    Types: Cigarettes  . Smokeless tobacco: Never Used  . Alcohol use 0.0 oz/week     Comment: occasionally     Family History  Problem Relation Age of Onset  . Hypertension Other     Review of Systems -  Unable to obtain since patient is sedated/intubated  OBJECTIVE: Temp:  [97 F (36.1 C)-99 F (37.2 C)] 98.4 F (36.9 C) (05/28 0800) Pulse Rate:  [76-142] 124 (05/28 1300) Resp:  [15-38] 16 (05/28 1300) BP: (94-167)/(67-113) 107/78 (05/28 1300) SpO2:  [96 %-100 %] 98 % (05/28 1300) FiO2 (%):  [40 %-100 %] 40 % (05/28 1206) Weight:  [143 lb 15.4 oz (65.3  kg)-156 lb 1.4 oz (70.8 kg)] 156 lb 1.4 oz (70.8 kg) (05/28 0500) Physical Exam -on propofol gtt. FIO2 @ 40% Constitutional: intubated and sedated He appears well-developed and well-nourished. No distress.  HENT: right frontal ventriculostomy in place Mouth/Throat: OETT in place. No oropharyngeal exudate.  Cardiovascular: tachycardic. regular rhythm and normal heart sounds. Exam reveals no gallop and no friction rub.  No murmur heard.  Pulmonary/Chest: mild rhonchi bilaterally. No respiratory distress. He has no wheezes.  Abdominal: Soft. Bowel sounds are normal. He exhibits no distension. There is no tenderness.  Neurological: sedated, right pupil sluggish vs. left Skin: Scattered excoriation but no lesions that appear to be consistent with new rash.    LABS: Results for orders placed or performed during the hospital encounter of 05/20/17 (from the past 48 hour(s))  Blood Culture (routine x 2)     Status: None (Preliminary result)   Collection Time: 05/20/17 12:55 PM  Result Value Ref Range   Specimen Description BLOOD RIGHT ANTECUBITAL    Special Requests      BOTTLES DRAWN AEROBIC AND ANAEROBIC Blood Culture adequate volume   Culture  Setup Time      IN BOTH AEROBIC AND ANAEROBIC BOTTLES GRAM POSITIVE COCCI IN CHAINS Organism ID to follow CRITICAL RESULT CALLED TO, READ BACK BY AND VERIFIED WITH: TO GABBOTT(PHARMd) BY TCLEVELAND 05/21/2017 AT 5:06AM    Culture NO GROWTH < 24 HOURS    Report Status PENDING   Fungus culture, blood     Status: None (Preliminary result)   Collection Time: 05/20/17 12:55 PM  Result Value Ref Range   Specimen Description BLOOD RIGHT ANTECUBITAL    Special Requests      BOTTLES DRAWN AEROBIC AND ANAEROBIC Blood Culture adequate volume   Culture NO GROWTH < 24 HOURS    Report Status PENDING   Blood Culture ID Panel (Reflexed)     Status: Abnormal   Collection Time: 05/20/17 12:55 PM  Result Value Ref Range   Enterococcus species NOT DETECTED NOT  DETECTED   Listeria monocytogenes NOT DETECTED NOT DETECTED   Staphylococcus species NOT DETECTED NOT DETECTED   Staphylococcus aureus NOT DETECTED NOT DETECTED   Streptococcus species DETECTED (A) NOT DETECTED    Comment: CRITICAL RESULT CALLED TO, READ BACK BY AND VERIFIED WITH: TO GABBOTT(PHARMd) BY TCLEVELAND 05/21/2017 AT 5:06AM    Streptococcus agalactiae NOT DETECTED NOT DETECTED   Streptococcus pneumoniae DETECTED (A) NOT DETECTED    Comment: CRITICAL RESULT CALLED TO, READ BACK BY AND VERIFIED WITH: TO GABBOTT(PHARMd) BY TCLEVELAND 05/21/2017 AT 5:06AM    Streptococcus pyogenes NOT DETECTED NOT DETECTED   Acinetobacter baumannii NOT DETECTED NOT DETECTED   Enterobacteriaceae species NOT DETECTED NOT DETECTED  Enterobacter cloacae complex NOT DETECTED NOT DETECTED   Escherichia coli NOT DETECTED NOT DETECTED   Klebsiella oxytoca NOT DETECTED NOT DETECTED   Klebsiella pneumoniae NOT DETECTED NOT DETECTED   Proteus species NOT DETECTED NOT DETECTED   Serratia marcescens NOT DETECTED NOT DETECTED   Haemophilus influenzae NOT DETECTED NOT DETECTED   Neisseria meningitidis NOT DETECTED NOT DETECTED   Pseudomonas aeruginosa NOT DETECTED NOT DETECTED   Candida albicans NOT DETECTED NOT DETECTED   Candida glabrata NOT DETECTED NOT DETECTED   Candida krusei NOT DETECTED NOT DETECTED   Candida parapsilosis NOT DETECTED NOT DETECTED   Candida tropicalis NOT DETECTED NOT DETECTED  Comprehensive metabolic panel     Status: Abnormal   Collection Time: 05/20/17  1:08 PM  Result Value Ref Range   Sodium 137 135 - 145 mmol/L   Potassium 2.7 (LL) 3.5 - 5.1 mmol/L    Comment: CRITICAL RESULT CALLED TO, READ BACK BY AND VERIFIED WITH: B.ROWE,RN 05/20/17 _0  BY V.WILKINS    Chloride 102 101 - 111 mmol/L   CO2 23 22 - 32 mmol/L   Glucose, Bld 162 (H) 65 - 99 mg/dL   BUN 7 6 - 20 mg/dL   Creatinine, Ser 1.01 0.61 - 1.24 mg/dL   Calcium 9.6 8.9 - 10.3 mg/dL   Total Protein 9.0 (H) 6.5 -  8.1 g/dL   Albumin 3.0 (L) 3.5 - 5.0 g/dL   AST 59 (H) 15 - 41 U/L   ALT <5 (L) 17 - 63 U/L   Alkaline Phosphatase 140 (H) 38 - 126 U/L   Total Bilirubin 0.8 0.3 - 1.2 mg/dL   GFR calc non Af Amer >60 >60 mL/min   GFR calc Af Amer >60 >60 mL/min    Comment: (NOTE) The eGFR has been calculated using the CKD EPI equation. This calculation has not been validated in all clinical situations. eGFR's persistently <60 mL/min signify possible Chronic Kidney Disease.    Anion gap 12 5 - 15  CBC WITH DIFFERENTIAL     Status: Abnormal   Collection Time: 05/20/17  1:08 PM  Result Value Ref Range   WBC 7.1 4.0 - 10.5 K/uL   RBC 3.80 (L) 4.22 - 5.81 MIL/uL   Hemoglobin 11.5 (L) 13.0 - 17.0 g/dL   HCT 34.7 (L) 39.0 - 52.0 %   MCV 91.3 78.0 - 100.0 fL   MCH 30.3 26.0 - 34.0 pg   MCHC 33.1 30.0 - 36.0 g/dL   RDW 13.4 11.5 - 15.5 %   Platelets 383 150 - 400 K/uL   Neutrophils Relative % 78 %   Lymphocytes Relative 4 %   Monocytes Relative 18 %   Eosinophils Relative 0 %   Basophils Relative 0 %   Neutro Abs 5.5 1.7 - 7.7 K/uL   Lymphs Abs 0.3 (L) 0.7 - 4.0 K/uL   Monocytes Absolute 1.3 (H) 0.1 - 1.0 K/uL   Eosinophils Absolute 0.0 0.0 - 0.7 K/uL   Basophils Absolute 0.0 0.0 - 0.1 K/uL   WBC Morphology MILD LEFT SHIFT (1-5% METAS, OCC MYELO, OCC BANDS)   Magnesium     Status: Abnormal   Collection Time: 05/20/17  1:08 PM  Result Value Ref Range   Magnesium 1.4 (L) 1.7 - 2.4 mg/dL  Protime-INR     Status: None   Collection Time: 05/20/17  1:08 PM  Result Value Ref Range   Prothrombin Time 14.6 11.4 - 15.2 seconds   INR 1.13   I-Stat CG4 Lactic  Acid, ED  (not at  [facility])     Status: Abnormal   Collection Time: 05/20/17  1:25 PM  Result Value Ref Range   Lactic Acid, Venous 6.64 (HH) 0.5 - 1.9 mmol/L   Comment NOTIFIED PHYSICIAN   Blood Culture (routine x 2)     Status: None (Preliminary result)   Collection Time: 05/20/17  1:50 PM  Result Value Ref Range   Specimen Description BLOOD  LEFT FOREARM    Special Requests      BOTTLES DRAWN AEROBIC AND ANAEROBIC Blood Culture results may not be optimal due to an inadequate volume of blood received in culture bottles   Culture NO GROWTH < 24 HOURS    Report Status PENDING   CSF cell count with differential collection tube #: 1     Status: Abnormal   Collection Time: 05/20/17  4:10 PM  Result Value Ref Range   Tube # 1    Color, CSF COLORLESS COLORLESS   Appearance, CSF CLEAR CLEAR   Supernatant NOT INDICATED    RBC Count, CSF 1 (H) 0 /cu mm   WBC, CSF 6 (H) 0 - 5 /cu mm   Segmented Neutrophils-CSF TOO FEW TO COUNT, SMEAR AVAILABLE FOR REVIEW 0 - 6 %    Comment: RARE   Lymphs, CSF FEW 40 - 80 %   Other Cells, CSF FUNGAL ELEMENTS PRESENT   CSF cell count with differential collection tube #: 4     Status: Abnormal   Collection Time: 05/20/17  4:10 PM  Result Value Ref Range   Tube # 4    Color, CSF COLORLESS COLORLESS   Appearance, CSF CLEAR CLEAR   Supernatant NOT INDICATED    RBC Count, CSF 3 (H) 0 /cu mm   WBC, CSF 14 (HH) 0 - 5 /cu mm    Comment: CRITICAL RESULT CALLED TO, READ BACK BY AND VERIFIED WITH: [facility] RN AT 1914 05/20/17 BY WOOLLENK    Segmented Neutrophils-CSF 5 0 - 6 %   Lymphs, CSF 95 (H) 40 - 80 %   Other Cells, CSF FUNGAL ELEMENTS PRESENT   CSF culture     Status: None (Preliminary result)   Collection Time: 05/20/17  4:10 PM  Result Value Ref Range   Specimen Description CSF TUBE 2    Special Requests Immunocompromised    Gram Stain      WBC PRESENT, PREDOMINANTLY MONONUCLEAR YEAST CYTOSPIN SMEAR CRITICAL RESULT CALLED TO, READ BACK BY AND VERIFIED WITH: Debby Bud AT 1905 05/20/17 BY L BENFIELD    Culture NO GROWTH < 24 HOURS    Report Status PENDING   Glucose, CSF     Status: None   Collection Time: 05/20/17  4:10 PM  Result Value Ref Range   Glucose, CSF 62 40 - 70 mg/dL  Protein, CSF     Status: None   Collection Time: 05/20/17  4:10 PM  Result Value Ref Range   Total   Protein, CSF 39 15 - 45 mg/dL  Cryptococcal antigen, CSF     Status: Abnormal   Collection Time: 05/20/17  4:10 PM  Result Value Ref Range   Crypto Ag POSITIVE (A) NEGATIVE    Comment: CRITICAL RESULT CALLED TO, READ BACK BY AND VERIFIED WITH: TO NPEARSE(RN)BY TCLEVELAND 05/21/2017 AT 00:22AM    Cryptococcal Ag Titer >2,560 (A) NOT INDICATED    Comment: CRITICAL RESULT CALLED TO, READ BACK BY AND VERIFIED WITH: TO NPEARSE(RN)BY TCLEVELAND 05/21/2017 AT 00:22AM   I-Stat  arterial blood gas, ED     Status: Abnormal   Collection Time: 05/20/17  5:04 PM  Result Value Ref Range   pH, Arterial 7.561 (H) 7.350 - 7.450   pCO2 arterial 26.3 (L) 32.0 - 48.0 mmHg   pO2, Arterial 96.0 83.0 - 108.0 mmHg   Bicarbonate 23.7 20.0 - 28.0 mmol/L   TCO2 25 0 - 100 mmol/L   O2 Saturation 99.0 %   Acid-Base Excess 2.0 0.0 - 2.0 mmol/L   Patient temperature 97.7 F    Collection site RADIAL, ALLEN'S TEST ACCEPTABLE    Drawn by RT    Sample type ARTERIAL   Urinalysis, Routine w reflex microscopic     Status: None   Collection Time: 05/20/17  5:42 PM  Result Value Ref Range   Color, Urine YELLOW YELLOW   APPearance CLEAR CLEAR   Specific Gravity, Urine 1.020 1.005 - 1.030   pH 7.0 5.0 - 8.0   Glucose, UA NEGATIVE NEGATIVE mg/dL   Hgb urine dipstick NEGATIVE NEGATIVE   Bilirubin Urine NEGATIVE NEGATIVE   Ketones, ur NEGATIVE NEGATIVE mg/dL   Protein, ur NEGATIVE NEGATIVE mg/dL   Nitrite NEGATIVE NEGATIVE   Leukocytes, UA NEGATIVE NEGATIVE  MRSA PCR Screening     Status: None   Collection Time: 05/20/17  6:28 PM  Result Value Ref Range   MRSA by PCR NEGATIVE NEGATIVE    Comment:        The GeneXpert MRSA Assay (FDA approved for NASAL specimens only), is one component of a comprehensive MRSA colonization surveillance program. It is not intended to diagnose MRSA infection nor to guide or monitor treatment for MRSA infections.   CBC     Status: Abnormal   Collection Time: 05/20/17  7:00 PM    Result Value Ref Range   WBC 4.2 4.0 - 10.5 K/uL   RBC 3.71 (L) 4.22 - 5.81 MIL/uL   Hemoglobin 11.3 (L) 13.0 - 17.0 g/dL   HCT 33.5 (L) 39.0 - 52.0 %   MCV 90.3 78.0 - 100.0 fL   MCH 30.5 26.0 - 34.0 pg   MCHC 33.7 30.0 - 36.0 g/dL   RDW 13.4 11.5 - 15.5 %   Platelets 305 150 - 400 K/uL  Comprehensive metabolic panel     Status: Abnormal   Collection Time: 05/20/17  7:00 PM  Result Value Ref Range   Sodium 135 135 - 145 mmol/L   Potassium 3.0 (L) 3.5 - 5.1 mmol/L   Chloride 104 101 - 111 mmol/L   CO2 24 22 - 32 mmol/L   Glucose, Bld 109 (H) 65 - 99 mg/dL   BUN 5 (L) 6 - 20 mg/dL   Creatinine, Ser 0.82 0.61 - 1.24 mg/dL   Calcium 8.9 8.9 - 10.3 mg/dL   Total Protein 8.1 6.5 - 8.1 g/dL   Albumin 2.6 (L) 3.5 - 5.0 g/dL   AST 44 (H) 15 - 41 U/L   ALT 21 17 - 63 U/L   Alkaline Phosphatase 128 (H) 38 - 126 U/L   Total Bilirubin 0.7 0.3 - 1.2 mg/dL   GFR calc non Af Amer >60 >60 mL/min   GFR calc Af Amer >60 >60 mL/min    Comment: (NOTE) The eGFR has been calculated using the CKD EPI equation. This calculation has not been validated in all clinical situations. eGFR's persistently <60 mL/min signify possible Chronic Kidney Disease.    Anion gap 7 5 - 15  Lactic acid, plasma  Status: Abnormal   Collection Time: 05/20/17  7:00 PM  Result Value Ref Range   Lactic Acid, Venous 2.3 (HH) 0.5 - 1.9 mmol/L    Comment: CRITICAL RESULT CALLED TO, READ BACK BY AND VERIFIED WITH: RN JENKINS,S AT 2002 [id number] MARTINB   Cortisol     Status: None   Collection Time: 05/20/17  7:00 PM  Result Value Ref Range   Cortisol, Plasma 12.1 ug/dL    Comment: (NOTE) AM    6.7 - 22.6 ug/dL PM   <10.0       ug/dL   Troponin I     Status: None   Collection Time: 05/20/17  7:00 PM  Result Value Ref Range   Troponin I <0.03 <0.03 ng/mL  Protime-INR     Status: Abnormal   Collection Time: 05/20/17  7:00 PM  Result Value Ref Range   Prothrombin Time 15.3 (H) 11.4 - 15.2 seconds   INR 1.20    Procalcitonin     Status: None   Collection Time: 05/20/17  7:00 PM  Result Value Ref Range   Procalcitonin 0.20 ng/mL    Comment:        Interpretation: PCT (Procalcitonin) <= 0.5 ng/mL: Systemic infection (sepsis) is not likely. Local bacterial infection is possible. (NOTE)         ICU PCT Algorithm               Non ICU PCT Algorithm    ----------------------------     ------------------------------         PCT < 0.25 ng/mL                 PCT < 0.1 ng/mL     Stopping of antibiotics            Stopping of antibiotics       strongly encouraged.               strongly encouraged.    ----------------------------     ------------------------------       PCT level decrease by               PCT < 0.25 ng/mL       >= 80% from peak PCT       OR PCT 0.25 - 0.5 ng/mL          Stopping of antibiotics                                             encouraged.     Stopping of antibiotics           encouraged.    ----------------------------     ------------------------------       PCT level decrease by              PCT >= 0.25 ng/mL       < 80% from peak PCT        AND PCT >= 0.5 ng/mL            Continuin g antibiotics                                              encouraged.       Continuing antibiotics  encouraged.    ----------------------------     ------------------------------     PCT level increase compared          PCT > 0.5 ng/mL         with peak PCT AND          PCT >= 0.5 ng/mL             Escalation of antibiotics                                          strongly encouraged.      Escalation of antibiotics        strongly encouraged.   APTT     Status: None   Collection Time: 05/20/17  7:00 PM  Result Value Ref Range   aPTT 34 24 - 36 seconds  Type and screen [place]     Status: None   Collection Time: 05/20/17  7:00 PM  Result Value Ref Range   ABO/RH(D) B NEG    Antibody Screen NEG    Sample Expiration 05/23/2017   Triglycerides      Status: None   Collection Time: 05/20/17  7:00 PM  Result Value Ref Range   Triglycerides 51 <150 mg/dL  ABO/Rh     Status: None   Collection Time: 05/20/17  7:02 PM  Result Value Ref Range   ABO/RH(D) B NEG   Glucose, capillary     Status: Abnormal   Collection Time: 05/20/17  8:13 PM  Result Value Ref Range   Glucose-Capillary 125 (H) 65 - 99 mg/dL  Lactic acid, plasma     Status: Abnormal   Collection Time: 05/20/17  9:34 PM  Result Value Ref Range   Lactic Acid, Venous 2.5 (HH) 0.5 - 1.9 mmol/L    Comment: CRITICAL RESULT CALLED TO, READ BACK BY AND VERIFIED WITH: JENKINS S,RN 05/20/17 2223 WAYK   Glucose, capillary     Status: Abnormal   Collection Time: 05/20/17 11:37 PM  Result Value Ref Range   Glucose-Capillary 147 (H) 65 - 99 mg/dL  CBC     Status: Abnormal   Collection Time: 05/21/17  3:23 AM  Result Value Ref Range   WBC 5.6 4.0 - 10.5 K/uL   RBC 3.77 (L) 4.22 - 5.81 MIL/uL   Hemoglobin 11.7 (L) 13.0 - 17.0 g/dL   HCT 34.4 (L) 39.0 - 52.0 %   MCV 91.2 78.0 - 100.0 fL   MCH 31.0 26.0 - 34.0 pg   MCHC 34.0 30.0 - 36.0 g/dL   RDW 13.9 11.5 - 15.5 %   Platelets 279 150 - 400 K/uL  Basic metabolic panel     Status: Abnormal   Collection Time: 05/21/17  3:23 AM  Result Value Ref Range   Sodium 138 135 - 145 mmol/L   Potassium 3.2 (L) 3.5 - 5.1 mmol/L   Chloride 108 101 - 111 mmol/L   CO2 22 22 - 32 mmol/L   Glucose, Bld 103 (H) 65 - 99 mg/dL   BUN 6 6 - 20 mg/dL   Creatinine, Ser 0.72 0.61 - 1.24 mg/dL   Calcium 9.0 8.9 - 10.3 mg/dL   GFR calc non Af Amer >60 >60 mL/min   GFR calc Af Amer >60 >60 mL/min    Comment: (NOTE) The eGFR has been calculated using the CKD EPI equation. This calculation has  not been validated in all clinical situations. eGFR's persistently <60 mL/min signify possible Chronic Kidney Disease.    Anion gap 8 5 - 15  Magnesium     Status: None   Collection Time: 05/21/17  3:23 AM  Result Value Ref Range   Magnesium 1.9 1.7 - 2.4  mg/dL  Phosphorus     Status: None   Collection Time: 05/21/17  3:23 AM  Result Value Ref Range   Phosphorus 3.6 2.5 - 4.6 mg/dL  Glucose, capillary     Status: None   Collection Time: 05/21/17  3:27 AM  Result Value Ref Range   Glucose-Capillary 80 65 - 99 mg/dL  Blood gas, arterial     Status: Abnormal   Collection Time: 05/21/17  4:15 AM  Result Value Ref Range   FIO2 60.00    Delivery systems VENTILATOR    Mode PRESSURE REGULATED VOLUME CONTROL    VT 640 mL   LHR 16 resp/min   Peep/cpap 5.0 cm H20   pH, Arterial 7.535 (H) 7.350 - 7.450   pCO2 arterial 26.3 (L) 32.0 - 48.0 mmHg   pO2, Arterial 219 (H) 83.0 - 108.0 mmHg   Bicarbonate 22.1 20.0 - 28.0 mmol/L   Acid-base deficit 0.3 0.0 - 2.0 mmol/L   O2 Saturation 99.5 %   Patient temperature 98.6    Collection site RIGHT RADIAL    Drawn by [phone]    Sample type ARTERIAL DRAW    Allens test (pass/fail) PASS PASS  Glucose, capillary     Status: Abnormal   Collection Time: 05/21/17  8:24 AM  Result Value Ref Range   Glucose-Capillary 128 (H) 65 - 99 mg/dL   Comment 1 Notify RN    Comment 2 Document in Chart   Glucose, capillary     Status: Abnormal   Collection Time: 05/21/17 12:05 PM  Result Value Ref Range   Glucose-Capillary 106 (H) 65 - 99 mg/dL  Magnesium     Status: None   Collection Time: 05/21/17 12:49 PM  Result Value Ref Range   Magnesium 1.8 1.7 - 2.4 mg/dL  Phosphorus     Status: None   Collection Time: 05/21/17 12:49 PM  Result Value Ref Range   Phosphorus 3.6 2.5 - 4.6 mg/dL    MICRO: 5/27 CSF - gram stain showing yeast 5/27 blood cx 1 fo 2 sets strep pneumo by BCID  IMAGING: Ct Head Wo Contrast  Result Date: 05/21/2017 CLINICAL DATA:  Headaches and recent seizure activity EXAM: CT HEAD WITHOUT CONTRAST TECHNIQUE: Contiguous axial images were obtained from the base of the skull through the vertex without intravenous contrast. COMPARISON:  05/20/2017 FINDINGS: Brain: No findings to suggest acute  hemorrhage, acute infarction or space-occupying mass lesion are noted. There is again noted some mild decreased attenuation in the midportion of the right internal capsule stable from the previous exam. No new focal abnormality is noted. Vascular: No hyperdense vessel or unexpected calcification. Skull: Normal. Negative for fracture or focal lesion. Sinuses/Orbits: No acute finding. Other: None. IMPRESSION: Stable changes from the previous day.  No acute abnormality noted. Electronically Signed   By: Inez Catalina M.D.   On: 05/21/2017 09:42   Ct Head W Or Wo Contrast  Result Date: 05/20/2017 CLINICAL DATA:  New onset seizure.  History of HIV. EXAM: CT HEAD WITHOUT AND WITH CONTRAST TECHNIQUE: Contiguous axial images were obtained from the base of the skull through the vertex without and with intravenous contrast CONTRAST:  75 mL Isovue-300  COMPARISON:  Noncontrast head CT 05/18/2017 FINDINGS: Brain: There is no evidence of acute infarct, intracranial hemorrhage, mass, midline shift, or extra-axial fluid collection. Minimal prominence of the lateral ventricles for age is unchanged. A small amount of subtle hypoattenuation in the right internal capsule is unchanged and nonspecific. No abnormal enhancement is identified. Vascular: Visible vessels are patent. Skull: No fracture or focal osseous lesion. Sinuses/Orbits: No significant inflammatory disease in the imaged portions of the paranasal sinuses and mastoid air cells. Unremarkable orbits. Other: None. IMPRESSION: 1. No acute intracranial abnormality identified. 2. Minimal unchanged hypoattenuation in the right internal capsule, nonspecific. Electronically Signed   By: Logan Bores M.D.   On: 05/20/2017 15:29   Dg Chest Port 1 View  Result Date: 05/21/2017 CLINICAL DATA:  Check endotracheal tube placement EXAM: PORTABLE CHEST 1 VIEW COMPARISON:  05/20/2017 FINDINGS: Cardiac shadow is within normal limits. Endotracheal tube and nasogastric catheter are noted  in satisfactory position. The lungs are clear bilaterally. No bony abnormality is noted. IMPRESSION: Tubes and lines as described.  No acute abnormality noted. Electronically Signed   By: Inez Catalina M.D.   On: 05/21/2017 08:17   Dg Chest Portable 1 View  Result Date: 05/20/2017 CLINICAL DATA:  Evaluate ETT placement EXAM: PORTABLE CHEST 1 VIEW COMPARISON:  May 18, 2017 FINDINGS: The ETT is in good position. The OG tube terminates below today's film. No pneumothorax. The lungs are clear. No other abnormalities. IMPRESSION: Support apparatus placement as above. Electronically Signed   By: Dorise Bullion III M.D   On: 05/20/2017 14:36    Assessment/Plan:  61PJ M with advanced hiv disease recently on new regimen x 4 wk admitted for seizure, unresponsiveness but complained of HA/N/V for the past 5 days prior to admit. 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 Will add steroids to help with management of IRIS. Will schedule dexamethasone at 63m Q 4hr - which is roughly 0.3-0.484mkg/day - will plan for 2 weeks at this dose before starting to taper.  - please continue to monitor daily electrolytes and cbc including Mg for need of repletion given effects of ampho on the kidney and flucytosine on bone marrow  2) HIV disease/AIDS - holding ART due to CM IRIS for now. CD 4 count and HIV VL are pending to see if it fits clinical picture of IRIS  3) elevated ICP = very appreciative of drs kirkpatrick and nundkumar for management of acute neuro emergency and treatment with EVD  4) seizure = likely due CM. Anti epileptics getting changed to keppra which has  fewer side effects with HIV meds  5) fixed pupil = as a results of ICP. Will continue to monitor neuro symptoms.  6) strep pneumonaie bacteremia = continue with ceftriaxone 2gm IV daily. CSF suggests CM rather than strep pneumo meningitis. Can discontinue vancomycin and minimize nephrotoxicity. Recommend repeat blood cx tomorrow.  7) OI proph = continue with equivalent for bactrim SS tablet by IV formulation. Later in the week will do qweek azithromycin.  8) chronic hepatitis b = his hep B VL > 10MU in march 2018. His hiv regimen did cover hep b but now we are stopping his ART for CM. We will watch his  LFTs, if they start trending up then we will start entecavir in order to minimize risk of hep B flare. Will check hep B VL now   Maston Wight B. [facility] [phone]

## 2017-05-21 NOTE — Progress Notes (Signed)
Pharmacy Antibiotic Note  Justin Ball is a 26 y.o. male, HIV+, with cryptococcal meningoencephalitis, now Strep pneumo bacteremia and possible meningitis.  Pharmacy has been consulted for Vancomycin dosing.  Vancomycin last given at 1320 5/27  Plan: Restart Vancomycin 1 g IV q8h  Height: 6\' 1"  (185.4 cm) Weight: 156 lb 1.4 oz (70.8 kg) IBW/kg (Calculated) : 79.9  Temp (24hrs), Avg:98 F (36.7 C), Min:97 F (36.1 C), Max:99 F (37.2 C)   Recent Labs Lab 05/18/17 1941 05/20/17 1308 05/20/17 1325 05/20/17 1900 05/20/17 2134 05/21/17 0323  WBC 3.6* 7.1  --  4.2  --  5.6  CREATININE 0.71 1.01  --  0.82  --  0.72  LATICACIDVEN  --   --  6.64* 2.3* 2.5*  --     Estimated Creatinine Clearance: 140.1 mL/min (by C-G formula based on SCr of 0.72 mg/dL).    No Known Allergies  Eddie Candlebbott, Newell Frater [place] 05/21/2017 6:09 AM

## 2017-05-21 NOTE — Progress Notes (Signed)
LTM EEG was discontinued while in CT.

## 2017-05-21 NOTE — Progress Notes (Signed)
Initial Nutrition Assessment  DOCUMENTATION CODES:   Not applicable  INTERVENTION:   Begin TF:   -Recommend starting Vital AF 1.2 at goal of 70 ml/hr providing 2016 kcals, 126 g of protein and 1361 mL of free water. Meets 100% of estimated needs. PEPuP initiated. Will need to adjust TF rate if propofol restarted and providing significant calories. Continue to assess   NUTRITION DIAGNOSIS:   Inadequate oral intake related to acute illness, inability to eat as evidenced by NPO status.  GOAL:   Provide needs based on ASPEN/SCCM guidelines  MONITOR:   Vent status, TF tolerance, Weight trends, Labs, I & O's  REASON FOR ASSESSMENT:   Ventilator, Consult Enteral/tube feeding initiation and management  ASSESSMENT:    26 yo male admitted with AMS, witnessed seizures, febrile. Pt with hx of HIV AIDS with hx of noncompliance with meds   Intubated for airway protection, septic, critically ill with multiple organ system failure S/p LP that was highly suggestive of cryptococcal meningoencephalitis, strep in blood 5/28 hydrocephalus s/p right frontal ventriculosotmy  OG tube in stomach  Patient is currently intubated on ventilator support MV: 10.6 L/min Temp (24hrs), Avg:98 F (36.7 C), Min:97 F (36.1 C), Max:99 F (37.2 C)  Propofol: off at present and per Junious Dresseronnie RN plan to only use minimally if at all  Labs: potassium 3.2 (supplemented) Meds: D5-1/2 NS with KCl at 50 ml/hr  (204 kcals), decadron  Mother at bedside and reports pt has lost weight but she is unsure of how much. Mother also indicates that in general he eats well but recently experiencing some N/V with poor po. No significant recent wt loss per wt encounters below  Weights/Temps    Wt Readings from Last 10 Encounters:  05/21/17 156 lb 1.4 oz (70.8 kg)  05/18/17 140 lb (63.5 kg)  04/18/17 148 lb (67.1 kg)  12/06/16 150 lb (68 kg)  09/11/16 147 lb 12.8 oz (67 kg)  09/29/15 160 lb (72.6 kg)  07/23/15 155 lb  (70.3 kg)  01/19/15 159 lb (72.1 kg)  12/28/14 178 lb 3.3 oz (80.8 kg)  10/06/13 200 lb 4 oz (90.8 kg)       Diet Order:  Diet NPO time specified  Skin:  Reviewed, no issues  Last BM:  no documented BM  Height:   Ht Readings from Last 1 Encounters:  05/20/17 6\' 1"  (1.854 m)    Weight:   Wt Readings from Last 1 Encounters:  05/21/17 156 lb 1.4 oz (70.8 kg)    BMI:  Body mass index is 20.59 kg/m.  Estimated Nutritional Needs:   Kcal:  1996 kcals  Protein:  107-142 g  Fluid:  >/= 2 L  EDUCATION NEEDS:   No education needs identified at this time  Romelle StarcherCate Tamie Minteer MS, RD, LDN [phone] Pager  [phone] Weekend/On-Call Pager

## 2017-05-21 NOTE — Procedures (Signed)
PREOP DX: Hydrocephalus  POSTOP DX: Same  PROCEDURE: Right frontal ventriculostomy   SURGEON: Dr. Lisbeth RenshawNeelesh Eva Vallee, MD  ANESTHESIA: IV Sedation (propofol and fentanyl) with Local  EBL: Minimal  SPECIMENS: None  COMPLICATIONS: None  CONDITION: Hemodynamically stable  INDICATIONS: Mrs. Justin Ball Justin Ball is a 26 y.o. 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 EVD is therefore indicated.  PROCEDURE IN DETAIL: After consent was obtained from the patient's family, skin of the right frontal scalp was clipped, prepped and draped in the usual sterile fashion.  Scalp was then infiltrated with local anesthetic with epinephrine.  Skin incision was made sharply, and twist drill burr hole was made.  The dura was then incised, and the ventricular catheter was passed in 3 attempts into the right lateral ventricle.  Good clear CSF flow was obtained.  The catheter was then tunneled subcutaneously and connected to a drainage system and the skin incision closed.  The drain was then secured in place.  FINDINGS: 1. Opening pressure >20cmH2O 2. Clear CSF

## 2017-05-21 NOTE — Progress Notes (Signed)
Sputum sample obtained and sent to lab.  

## 2017-05-21 NOTE — Progress Notes (Signed)
CRITICAL VALUE ALERT  Critical Value:  Positive cryptococcus & Crypto Titer > 2,560   Date & Time Notied:  05/21/17 0027   Provider Notified: Dr. Cyndra NumbersSmith Elink  Orders Received/Actions taken: Notified Provider

## 2017-05-21 NOTE — H&P (Signed)
Justin Ball is an 26 y.o. male.   Chief Complaint: Headache, seizures HPI:   Patient intubated sedated so history obtained from ER physician  Patient has been having headache for past few days. He was seen in ER 2 days ago and discharged. Today he had witnessed seizures by family and repeat in ER. When brought in to ER, he was post ictal but did follow some commands. Then he had repeat seizures and was intubated. He had disconjugate eyes which resolved.   He has also been having fevers lately. Grandmother expressed concern of his compliance with HIV meds.   26 year old male with history of HIV AIDS with history of noncompliance here with altered mental status. History is limited as patient was pulled out of his car unresponsive. Family did report that he thought the patient had a seizure. On arrival, patient is postictal and unable to provide history.  Per review of records, patient was seen 3 days ago in the ER for headache. He was afebrile and otherwise very well-appearing at that time. He had a CT scan which was negative and sent home with supportive care.  Subjective: Blew pupil overnight and became completely unresponsive.  Past Medical History:  Diagnosis Date  . Anxiety   . Bipolar 1 disorder ([place])   . Depression   . HIV infection ([place])   . Neuromuscular disorder ([place])   . Scabies   . 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 Substance abuse   . Syphilis     Past Surgical History:  Procedure Laterality Date  . EXAMINATION UNDER ANESTHESIA N/A 12/28/2014   Procedure: EXAM UNDER ANESTHESIA;  Surgeon: Michael Boston, MD;  Location: [facility];  Service: General;  Laterality: N/A;  . FLOOR OF MOUTH BIOPSY N/A 09/16/2016   Procedure: BIOPSY OF ORAL ABCESS;  Surgeon: Leta Baptist, MD;  Location: [facility];  Service: ENT;  Laterality: N/A;  . INCISION AND DRAINAGE ABSCESS N/A 09/16/2016   Procedure: INCISION AND DRAINAGE ABSCESS;  Surgeon: Leta Baptist, MD;  Location: [facility];  Service: ENT;  Laterality:  N/A;  . INCISION AND DRAINAGE PERIRECTAL ABSCESS  08/27/2010   Dr Marlou Starks  . INCISION AND DRAINAGE PERIRECTAL ABSCESS N/A 12/28/2014   Procedure: IRRIGATION AND DEBRIDEMENT PERIRECTAL ABSCESS;  Surgeon: Michael Boston, MD;  Location: [facility];  Service: General;  Laterality: N/A;  Fistula repair and ablation   Family History  Problem Relation Age of Onset  . Hypertension Other    Social History:  reports that he has been smoking Cigarettes.  He has a 1.25 pack-year smoking history. He has never used smokeless tobacco. He reports that he drinks alcohol. He reports that he does not use drugs.  Allergies: No Known Allergies  Medications Prior to Admission  Medication Sig Dispense Refill  . dolutegravir (TIVICAY) 50 MG tablet Take 1 tablet (50 mg total) by mouth daily. 30 tablet 6  . emtricitabine-rilpivir-tenofovir AF (ODEFSEY) 200-25-25 MG TABS tablet Take 1 tablet by mouth daily with breakfast. 30 tablet 6  . naproxen sodium (ANAPROX) 220 MG tablet Take 220 mg by mouth 2 (two) times daily as needed (pain).    Marland Kitchen sulfamethoxazole-trimethoprim (BACTRIM) 400-80 MG tablet Take 1 tablet by mouth daily. 30 tablet 11    Results for orders placed or performed during the hospital encounter of 05/20/17 (from the past 48 hour(s))  Blood Culture (routine x 2)     Status: None (Preliminary result)   Collection Time: 05/20/17 12:55 PM  Result Value Ref Range  Specimen Description BLOOD RIGHT ANTECUBITAL    Special Requests      BOTTLES DRAWN AEROBIC AND ANAEROBIC Blood Culture adequate volume   Culture  Setup Time      IN BOTH AEROBIC AND ANAEROBIC BOTTLES GRAM POSITIVE COCCI IN CHAINS Organism ID to follow CRITICAL RESULT CALLED TO, READ BACK BY AND VERIFIED WITH: TO GABBOTT(PHARMd) BY TCLEVELAND 05/21/2017 AT 5:06AM    Culture PENDING    Report Status PENDING   Blood Culture ID Panel (Reflexed)     Status: Abnormal   Collection Time: 05/20/17 12:55 PM  Result Value Ref Range   Enterococcus species NOT  DETECTED NOT DETECTED   Listeria monocytogenes NOT DETECTED NOT DETECTED   Staphylococcus species NOT DETECTED NOT DETECTED   Staphylococcus aureus NOT DETECTED NOT DETECTED   Streptococcus species DETECTED (A) NOT DETECTED    Comment: CRITICAL RESULT CALLED TO, READ BACK BY AND VERIFIED WITH: TO GABBOTT(PHARMd) BY TCLEVELAND 05/21/2017 AT 5:06AM    Streptococcus agalactiae NOT DETECTED NOT DETECTED   Streptococcus pneumoniae DETECTED (A) NOT DETECTED    Comment: CRITICAL RESULT CALLED TO, READ BACK BY AND VERIFIED WITH: TO GABBOTT(PHARMd) BY TCLEVELAND 05/21/2017 AT 5:06AM    Streptococcus pyogenes NOT DETECTED NOT DETECTED   Acinetobacter baumannii NOT DETECTED NOT DETECTED   Enterobacteriaceae species NOT DETECTED NOT DETECTED   Enterobacter cloacae complex NOT DETECTED NOT DETECTED   Escherichia coli NOT DETECTED NOT DETECTED   Klebsiella oxytoca NOT DETECTED NOT DETECTED   Klebsiella pneumoniae NOT DETECTED NOT DETECTED   Proteus species NOT DETECTED NOT DETECTED   Serratia marcescens NOT DETECTED NOT DETECTED   Haemophilus influenzae NOT DETECTED NOT DETECTED   Neisseria meningitidis NOT DETECTED NOT DETECTED   Pseudomonas aeruginosa NOT DETECTED NOT DETECTED   Candida albicans NOT DETECTED NOT DETECTED   Candida glabrata NOT DETECTED NOT DETECTED   Candida krusei NOT DETECTED NOT DETECTED   Candida parapsilosis NOT DETECTED NOT DETECTED   Candida tropicalis NOT DETECTED NOT DETECTED  Comprehensive metabolic panel     Status: Abnormal   Collection Time: 05/20/17  1:08 PM  Result Value Ref Range   Sodium 137 135 - 145 mmol/L   Potassium 2.7 (LL) 3.5 - 5.1 mmol/L    Comment: CRITICAL RESULT CALLED TO, READ BACK BY AND VERIFIED WITH: B.ROWE,RN 05/20/17 '@1410'  BY V.WILKINS    Chloride 102 101 - 111 mmol/L   CO2 23 22 - 32 mmol/L   Glucose, Bld 162 (H) 65 - 99 mg/dL   BUN 7 6 - 20 mg/dL   Creatinine, Ser 1.01 0.61 - 1.24 mg/dL   Calcium 9.6 8.9 - 10.3 mg/dL   Total Protein  9.0 (H) 6.5 - 8.1 g/dL   Albumin 3.0 (L) 3.5 - 5.0 g/dL   AST 59 (H) 15 - 41 U/L   ALT <5 (L) 17 - 63 U/L   Alkaline Phosphatase 140 (H) 38 - 126 U/L   Total Bilirubin 0.8 0.3 - 1.2 mg/dL   GFR calc non Af Amer >60 >60 mL/min   GFR calc Af Amer >60 >60 mL/min    Comment: (NOTE) The eGFR has been calculated using the CKD EPI equation. This calculation has not been validated in all clinical situations. eGFR's persistently <60 mL/min signify possible Chronic Kidney Disease.    Anion gap 12 5 - 15  CBC WITH DIFFERENTIAL     Status: Abnormal   Collection Time: 05/20/17  1:08 PM  Result Value Ref Range   WBC 7.1 4.0 -  10.5 K/uL   RBC 3.80 (L) 4.22 - 5.81 MIL/uL   Hemoglobin 11.5 (L) 13.0 - 17.0 g/dL   HCT 34.7 (L) 39.0 - 52.0 %   MCV 91.3 78.0 - 100.0 fL   MCH 30.3 26.0 - 34.0 pg   MCHC 33.1 30.0 - 36.0 g/dL   RDW 13.4 11.5 - 15.5 %   Platelets 383 150 - 400 K/uL   Neutrophils Relative % 78 %   Lymphocytes Relative 4 %   Monocytes Relative 18 %   Eosinophils Relative 0 %   Basophils Relative 0 %   Neutro Abs 5.5 1.7 - 7.7 K/uL   Lymphs Abs 0.3 (L) 0.7 - 4.0 K/uL   Monocytes Absolute 1.3 (H) 0.1 - 1.0 K/uL   Eosinophils Absolute 0.0 0.0 - 0.7 K/uL   Basophils Absolute 0.0 0.0 - 0.1 K/uL   WBC Morphology MILD LEFT SHIFT (1-5% METAS, OCC MYELO, OCC BANDS)   Magnesium     Status: Abnormal   Collection Time: 05/20/17  1:08 PM  Result Value Ref Range   Magnesium 1.4 (L) 1.7 - 2.4 mg/dL  Protime-INR     Status: None   Collection Time: 05/20/17  1:08 PM  Result Value Ref Range   Prothrombin Time 14.6 11.4 - 15.2 seconds   INR 1.13   I-Stat CG4 Lactic Acid, ED  (not at  [facility])     Status: Abnormal   Collection Time: 05/20/17  1:25 PM  Result Value Ref Range   Lactic Acid, Venous 6.64 (HH) 0.5 - 1.9 mmol/L   Comment NOTIFIED PHYSICIAN   CSF cell count with differential collection tube #: 1     Status: Abnormal   Collection Time: 05/20/17  4:10 PM  Result Value Ref Range   Tube #  1    Color, CSF COLORLESS COLORLESS   Appearance, CSF CLEAR CLEAR   Supernatant NOT INDICATED    RBC Count, CSF 1 (H) 0 /cu mm   WBC, CSF 6 (H) 0 - 5 /cu mm   Segmented Neutrophils-CSF TOO FEW TO COUNT, SMEAR AVAILABLE FOR REVIEW 0 - 6 %    Comment: RARE   Lymphs, CSF FEW 40 - 80 %   Other Cells, CSF FUNGAL ELEMENTS PRESENT   CSF cell count with differential collection tube #: 4     Status: Abnormal   Collection Time: 05/20/17  4:10 PM  Result Value Ref Range   Tube # 4    Color, CSF COLORLESS COLORLESS   Appearance, CSF CLEAR CLEAR   Supernatant NOT INDICATED    RBC Count, CSF 3 (H) 0 /cu mm   WBC, CSF 14 (HH) 0 - 5 /cu mm    Comment: CRITICAL RESULT CALLED TO, READ BACK BY AND VERIFIED WITH: [facility] RN AT 1914 05/20/17 BY WOOLLENK    Segmented Neutrophils-CSF 5 0 - 6 %   Lymphs, CSF 95 (H) 40 - 80 %   Other Cells, CSF FUNGAL ELEMENTS PRESENT   CSF culture     Status: None (Preliminary result)   Collection Time: 05/20/17  4:10 PM  Result Value Ref Range   Specimen Description CSF TUBE 2    Special Requests Immunocompromised    Gram Stain      WBC PRESENT, PREDOMINANTLY MONONUCLEAR YEAST CYTOSPIN SMEAR CRITICAL RESULT CALLED TO, READ BACK BY AND VERIFIED WITH: Debby Bud AT 1905 05/20/17 BY L BENFIELD    Culture PENDING    Report Status PENDING   Glucose, CSF  Status: None   Collection Time: 05/20/17  4:10 PM  Result Value Ref Range   Glucose, CSF 62 40 - 70 mg/dL  Protein, CSF     Status: None   Collection Time: 05/20/17  4:10 PM  Result Value Ref Range   Total  Protein, CSF 39 15 - 45 mg/dL  Cryptococcal antigen, CSF     Status: Abnormal   Collection Time: 05/20/17  4:10 PM  Result Value Ref Range   Crypto Ag POSITIVE (A) NEGATIVE    Comment: CRITICAL RESULT CALLED TO, READ BACK BY AND VERIFIED WITH: TO NPEARSE(RN)BY TCLEVELAND 05/21/2017 AT 00:22AM    Cryptococcal Ag Titer >2,560 (A) NOT INDICATED    Comment: CRITICAL RESULT CALLED TO, READ BACK BY  AND VERIFIED WITH: TO NPEARSE(RN)BY TCLEVELAND 05/21/2017 AT 00:22AM   I-Stat arterial blood gas, ED     Status: Abnormal   Collection Time: 05/20/17  5:04 PM  Result Value Ref Range   pH, Arterial 7.561 (H) 7.350 - 7.450   pCO2 arterial 26.3 (L) 32.0 - 48.0 mmHg   pO2, Arterial 96.0 83.0 - 108.0 mmHg   Bicarbonate 23.7 20.0 - 28.0 mmol/L   TCO2 25 0 - 100 mmol/L   O2 Saturation 99.0 %   Acid-Base Excess 2.0 0.0 - 2.0 mmol/L   Patient temperature 97.7 F    Collection site RADIAL, ALLEN'S TEST ACCEPTABLE    Drawn by RT    Sample type ARTERIAL   Urinalysis, Routine w reflex microscopic     Status: None   Collection Time: 05/20/17  5:42 PM  Result Value Ref Range   Color, Urine YELLOW YELLOW   APPearance CLEAR CLEAR   Specific Gravity, Urine 1.020 1.005 - 1.030   pH 7.0 5.0 - 8.0   Glucose, UA NEGATIVE NEGATIVE mg/dL   Hgb urine dipstick NEGATIVE NEGATIVE   Bilirubin Urine NEGATIVE NEGATIVE   Ketones, ur NEGATIVE NEGATIVE mg/dL   Protein, ur NEGATIVE NEGATIVE mg/dL   Nitrite NEGATIVE NEGATIVE   Leukocytes, UA NEGATIVE NEGATIVE  MRSA PCR Screening     Status: None   Collection Time: 05/20/17  6:28 PM  Result Value Ref Range   MRSA by PCR NEGATIVE NEGATIVE    Comment:        The GeneXpert MRSA Assay (FDA approved for NASAL specimens only), is one component of a comprehensive MRSA colonization surveillance program. It is not intended to diagnose MRSA infection nor to guide or monitor treatment for MRSA infections.   CBC     Status: Abnormal   Collection Time: 05/20/17  7:00 PM  Result Value Ref Range   WBC 4.2 4.0 - 10.5 K/uL   RBC 3.71 (L) 4.22 - 5.81 MIL/uL   Hemoglobin 11.3 (L) 13.0 - 17.0 g/dL   HCT 33.5 (L) 39.0 - 52.0 %   MCV 90.3 78.0 - 100.0 fL   MCH 30.5 26.0 - 34.0 pg   MCHC 33.7 30.0 - 36.0 g/dL   RDW 13.4 11.5 - 15.5 %   Platelets 305 150 - 400 K/uL  Comprehensive metabolic panel     Status: Abnormal   Collection Time: 05/20/17  7:00 PM  Result Value  Ref Range   Sodium 135 135 - 145 mmol/L   Potassium 3.0 (L) 3.5 - 5.1 mmol/L   Chloride 104 101 - 111 mmol/L   CO2 24 22 - 32 mmol/L   Glucose, Bld 109 (H) 65 - 99 mg/dL   BUN 5 (L) 6 - 20 mg/dL  Creatinine, Ser 0.82 0.61 - 1.24 mg/dL   Calcium 8.9 8.9 - 10.3 mg/dL   Total Protein 8.1 6.5 - 8.1 g/dL   Albumin 2.6 (L) 3.5 - 5.0 g/dL   AST 44 (H) 15 - 41 U/L   ALT 21 17 - 63 U/L   Alkaline Phosphatase 128 (H) 38 - 126 U/L   Total Bilirubin 0.7 0.3 - 1.2 mg/dL   GFR calc non Af Amer >60 >60 mL/min   GFR calc Af Amer >60 >60 mL/min    Comment: (NOTE) The eGFR has been calculated using the CKD EPI equation. This calculation has not been validated in all clinical situations. eGFR's persistently <60 mL/min signify possible Chronic Kidney Disease.    Anion gap 7 5 - 15  Lactic acid, plasma     Status: Abnormal   Collection Time: 05/20/17  7:00 PM  Result Value Ref Range   Lactic Acid, Venous 2.3 (HH) 0.5 - 1.9 mmol/L    Comment: CRITICAL RESULT CALLED TO, READ BACK BY AND VERIFIED WITH: RN JENKINS,S AT 2002 [id number] MARTINB   Cortisol     Status: None   Collection Time: 05/20/17  7:00 PM  Result Value Ref Range   Cortisol, Plasma 12.1 ug/dL    Comment: (NOTE) AM    6.7 - 22.6 ug/dL PM   <10.0       ug/dL   Troponin I     Status: None   Collection Time: 05/20/17  7:00 PM  Result Value Ref Range   Troponin I <0.03 <0.03 ng/mL  Protime-INR     Status: Abnormal   Collection Time: 05/20/17  7:00 PM  Result Value Ref Range   Prothrombin Time 15.3 (H) 11.4 - 15.2 seconds   INR 1.20   Procalcitonin     Status: None   Collection Time: 05/20/17  7:00 PM  Result Value Ref Range   Procalcitonin 0.20 ng/mL    Comment:        Interpretation: PCT (Procalcitonin) <= 0.5 ng/mL: Systemic infection (sepsis) is not likely. Local bacterial infection is possible. (NOTE)         ICU PCT Algorithm               Non ICU PCT Algorithm    ----------------------------      ------------------------------         PCT < 0.25 ng/mL                 PCT < 0.1 ng/mL     Stopping of antibiotics            Stopping of antibiotics       strongly encouraged.               strongly encouraged.    ----------------------------     ------------------------------       PCT level decrease by               PCT < 0.25 ng/mL       >= 80% from peak PCT       OR PCT 0.25 - 0.5 ng/mL          Stopping of antibiotics                                             encouraged.     Stopping of antibiotics  encouraged.    ----------------------------     ------------------------------       PCT level decrease by              PCT >= 0.25 ng/mL       < 80% from peak PCT        AND PCT >= 0.5 ng/mL            Continuin g antibiotics                                              encouraged.       Continuing antibiotics            encouraged.    ----------------------------     ------------------------------     PCT level increase compared          PCT > 0.5 ng/mL         with peak PCT AND          PCT >= 0.5 ng/mL             Escalation of antibiotics                                          strongly encouraged.      Escalation of antibiotics        strongly encouraged.   APTT     Status: None   Collection Time: 05/20/17  7:00 PM  Result Value Ref Range   aPTT 34 24 - 36 seconds  Type and screen [place]     Status: None   Collection Time: 05/20/17  7:00 PM  Result Value Ref Range   ABO/RH(D) B NEG    Antibody Screen NEG    Sample Expiration 05/23/2017   Triglycerides     Status: None   Collection Time: 05/20/17  7:00 PM  Result Value Ref Range   Triglycerides 51 <150 mg/dL  ABO/Rh     Status: None   Collection Time: 05/20/17  7:02 PM  Result Value Ref Range   ABO/RH(D) B NEG   Glucose, capillary     Status: Abnormal   Collection Time: 05/20/17  8:13 PM  Result Value Ref Range   Glucose-Capillary 125 (H) 65 - 99 mg/dL  Lactic acid, plasma      Status: Abnormal   Collection Time: 05/20/17  9:34 PM  Result Value Ref Range   Lactic Acid, Venous 2.5 (HH) 0.5 - 1.9 mmol/L    Comment: CRITICAL RESULT CALLED TO, READ BACK BY AND VERIFIED WITH: JENKINS S,RN 05/20/17 2223 WAYK   Glucose, capillary     Status: Abnormal   Collection Time: 05/20/17 11:37 PM  Result Value Ref Range   Glucose-Capillary 147 (H) 65 - 99 mg/dL  CBC     Status: Abnormal   Collection Time: 05/21/17  3:23 AM  Result Value Ref Range   WBC 5.6 4.0 - 10.5 K/uL   RBC 3.77 (L) 4.22 - 5.81 MIL/uL   Hemoglobin 11.7 (L) 13.0 - 17.0 g/dL   HCT 34.4 (L) 39.0 - 52.0 %   MCV 91.2 78.0 - 100.0 fL   MCH 31.0 26.0 - 34.0 pg   MCHC 34.0 30.0 - 36.0 g/dL   RDW 13.9 11.5 -  15.5 %   Platelets 279 150 - 400 K/uL  Basic metabolic panel     Status: Abnormal   Collection Time: 05/21/17  3:23 AM  Result Value Ref Range   Sodium 138 135 - 145 mmol/L   Potassium 3.2 (L) 3.5 - 5.1 mmol/L   Chloride 108 101 - 111 mmol/L   CO2 22 22 - 32 mmol/L   Glucose, Bld 103 (H) 65 - 99 mg/dL   BUN 6 6 - 20 mg/dL   Creatinine, Ser 0.72 0.61 - 1.24 mg/dL   Calcium 9.0 8.9 - 10.3 mg/dL   GFR calc non Af Amer >60 >60 mL/min   GFR calc Af Amer >60 >60 mL/min    Comment: (NOTE) The eGFR has been calculated using the CKD EPI equation. This calculation has not been validated in all clinical situations. eGFR's persistently <60 mL/min signify possible Chronic Kidney Disease.    Anion gap 8 5 - 15  Magnesium     Status: None   Collection Time: 05/21/17  3:23 AM  Result Value Ref Range   Magnesium 1.9 1.7 - 2.4 mg/dL  Phosphorus     Status: None   Collection Time: 05/21/17  3:23 AM  Result Value Ref Range   Phosphorus 3.6 2.5 - 4.6 mg/dL  Glucose, capillary     Status: None   Collection Time: 05/21/17  3:27 AM  Result Value Ref Range   Glucose-Capillary 80 65 - 99 mg/dL  Blood gas, arterial     Status: Abnormal   Collection Time: 05/21/17  4:15 AM  Result Value Ref Range   FIO2 60.00     Delivery systems VENTILATOR    Mode PRESSURE REGULATED VOLUME CONTROL    VT 640 mL   LHR 16 resp/min   Peep/cpap 5.0 cm H20   pH, Arterial 7.535 (H) 7.350 - 7.450   pCO2 arterial 26.3 (L) 32.0 - 48.0 mmHg   pO2, Arterial 219 (H) 83.0 - 108.0 mmHg   Bicarbonate 22.1 20.0 - 28.0 mmol/L   Acid-base deficit 0.3 0.0 - 2.0 mmol/L   O2 Saturation 99.5 %   Patient temperature 98.6    Collection site RIGHT RADIAL    Drawn by [phone]    Sample type ARTERIAL DRAW    Allens test (pass/fail) PASS PASS  Glucose, capillary     Status: Abnormal   Collection Time: 05/21/17  8:24 AM  Result Value Ref Range   Glucose-Capillary 128 (H) 65 - 99 mg/dL   Comment 1 Notify RN    Comment 2 Document in Chart    Ct Head Wo Contrast  Result Date: 05/21/2017 CLINICAL DATA:  Headaches and recent seizure activity EXAM: CT HEAD WITHOUT CONTRAST TECHNIQUE: Contiguous axial images were obtained from the base of the skull through the vertex without intravenous contrast. COMPARISON:  05/20/2017 FINDINGS: Brain: No findings to suggest acute hemorrhage, acute infarction or space-occupying mass lesion are noted. There is again noted some mild decreased attenuation in the midportion of the right internal capsule stable from the previous exam. No new focal abnormality is noted. Vascular: No hyperdense vessel or unexpected calcification. Skull: Normal. Negative for fracture or focal lesion. Sinuses/Orbits: No acute finding. Other: None. IMPRESSION: Stable changes from the previous day.  No acute abnormality noted. Electronically Signed   By: Inez Catalina M.D.   On: 05/21/2017 09:42   Ct Head W Or Wo Contrast  Result Date: 05/20/2017 CLINICAL DATA:  New onset seizure.  History of HIV. EXAM: CT HEAD  WITHOUT AND WITH CONTRAST TECHNIQUE: Contiguous axial images were obtained from the base of the skull through the vertex without and with intravenous contrast CONTRAST:  75 mL Isovue-300 COMPARISON:  Noncontrast head CT 05/18/2017  FINDINGS: Brain: There is no evidence of acute infarct, intracranial hemorrhage, mass, midline shift, or extra-axial fluid collection. Minimal prominence of the lateral ventricles for age is unchanged. A small amount of subtle hypoattenuation in the right internal capsule is unchanged and nonspecific. No abnormal enhancement is identified. Vascular: Visible vessels are patent. Skull: No fracture or focal osseous lesion. Sinuses/Orbits: No significant inflammatory disease in the imaged portions of the paranasal sinuses and mastoid air cells. Unremarkable orbits. Other: None. IMPRESSION: 1. No acute intracranial abnormality identified. 2. Minimal unchanged hypoattenuation in the right internal capsule, nonspecific. Electronically Signed   By: Logan Bores M.D.   On: 05/20/2017 15:29   Dg Chest Port 1 View  Result Date: 05/21/2017 CLINICAL DATA:  Check endotracheal tube placement EXAM: PORTABLE CHEST 1 VIEW COMPARISON:  05/20/2017 FINDINGS: Cardiac shadow is within normal limits. Endotracheal tube and nasogastric catheter are noted in satisfactory position. The lungs are clear bilaterally. No bony abnormality is noted. IMPRESSION: Tubes and lines as described.  No acute abnormality noted. Electronically Signed   By: Inez Catalina M.D.   On: 05/21/2017 08:17   Dg Chest Portable 1 View  Result Date: 05/20/2017 CLINICAL DATA:  Evaluate ETT placement EXAM: PORTABLE CHEST 1 VIEW COMPARISON:  May 18, 2017 FINDINGS: The ETT is in good position. The OG tube terminates below today's film. No pneumothorax. The lungs are clear. No other abnormalities. IMPRESSION: Support apparatus placement as above. Electronically Signed   By: Dorise Bullion III M.D   On: 05/20/2017 14:36    Review of Systems  Unable to perform ROS: Intubated    Blood pressure (!) 125/92, pulse (!) 102, temperature 97.5 F (36.4 C), resp. rate 16, height '6\' 1"'  (1.854 m), weight 70.8 kg (156 lb 1.4 oz), SpO2 100 %. Physical Exam  Vitals  reviewed. Constitutional: He appears well-developed and well-nourished.  HENT:  Head: Normocephalic and atraumatic.  Left pupil blown, unresponsive  Eyes: Conjunctivae are normal. Right eye exhibits no discharge. Left eye exhibits no discharge.  Neck: Neck supple. No tracheal deviation present. Thyromegaly present.  Cardiovascular: Normal rate, regular rhythm and normal heart sounds.   Respiratory: Breath sounds normal.  GI: Soft. Bowel sounds are normal. He exhibits no distension. There is no tenderness. There is no rebound.  Musculoskeletal: He exhibits no edema or tenderness.  Neurological:  Intubated sedated. See neuro note for details.   Skin: Skin is dry.     Assessment/Plan  Admit to icu Lung protective ventilation, adjust vent for ABG Abx per ID service, appreciate input, crypto in CSF and strep in blood, rocephin, vanc and bactrim Sepsis bundle LP noted as above Neurosurgery to place a drain for increased ICP GI and DVT Px CT head negative for acute findings CXR clear Hypokalemia - replace Begin TF No family bedside.  The patient is critically ill with multiple organ systems failure and requires high complexity decision making for assessment and support, frequent evaluation and titration of therapies, application of advanced monitoring technologies and extensive interpretation of multiple databases.   Critical Care Time devoted to patient care services described in this note is  35  Minutes. This time reflects time of care of this signee Dr Jennet Maduro. This critical care time does not reflect procedure time, or teaching time or supervisory  time of PA/NP/Med student/Med Resident etc but could involve care discussion time.  Rush Farmer, M.D. [facility] Pulmonary/Critical Care Medicine. Pager: [phone]. After hours pager: [phone].

## 2017-05-21 NOTE — Progress Notes (Signed)
Pt transported on vent to CT.

## 2017-05-21 NOTE — Progress Notes (Signed)
eLink Physician-Brief Progress Note Patient Name: Justin Ball DOB: 12/24/1991 MRN: [id number]   Date of Service  05/21/2017  HPI/Events of Note  Strep pneumo bacteremia, likely meningitis  eICU Interventions  Ceftriaxone 2gm q12     Intervention Category Major Interventions: Infection - evaluation and management  Max FickleDouglas McQuaid 05/21/2017, 5:32 AM

## 2017-05-22 ENCOUNTER — Inpatient Hospital Stay (HOSPITAL_COMMUNITY): Payer: No Typology Code available for payment source

## 2017-05-22 ENCOUNTER — Encounter (HOSPITAL_COMMUNITY): Payer: Self-pay

## 2017-05-22 LAB — BLOOD GAS, ARTERIAL
ACID-BASE DEFICIT: 1.9 mmol/L (ref 0.0–2.0)
BICARBONATE: 21 mmol/L (ref 20.0–28.0)
DRAWN BY: 41977
FIO2: 40
MECHVT: 640 mL
O2 Saturation: 99.3 %
PEEP/CPAP: 5 cmH2O
PO2 ART: 171 mmHg — AB (ref 83.0–108.0)
Patient temperature: 98.6
RATE: 16 resp/min
pCO2 arterial: 27.7 mmHg — ABNORMAL LOW (ref 32.0–48.0)
pH, Arterial: 7.492 — ABNORMAL HIGH (ref 7.350–7.450)

## 2017-05-22 LAB — CBC
HCT: 31.1 % — ABNORMAL LOW (ref 39.0–52.0)
HEMOGLOBIN: 10 g/dL — AB (ref 13.0–17.0)
MCH: 29.1 pg (ref 26.0–34.0)
MCHC: 32.2 g/dL (ref 30.0–36.0)
MCV: 90.4 fL (ref 78.0–100.0)
PLATELETS: 193 10*3/uL (ref 150–400)
RBC: 3.44 MIL/uL — AB (ref 4.22–5.81)
RDW: 13.8 % (ref 11.5–15.5)
WBC: 4.6 10*3/uL (ref 4.0–10.5)

## 2017-05-22 LAB — CD4/CD8 (T-HELPER/T-SUPPRESSOR CELL)
CD4 absolute: 2 /uL — ABNORMAL LOW (ref 500–1900)
CD8 T CELL ABS: 72 /uL — AB (ref 230–1000)
CD8tox: 260 % — ABNORMAL HIGH (ref 15.0–40.0)
RATIO: 0.003 — AB (ref 1.0–3.0)
Total lymphocyte count: 360 /uL — ABNORMAL LOW (ref 1000–4000)

## 2017-05-22 LAB — BASIC METABOLIC PANEL
Anion gap: 6 (ref 5–15)
BUN: 15 mg/dL (ref 6–20)
CHLORIDE: 111 mmol/L (ref 101–111)
CO2: 22 mmol/L (ref 22–32)
CREATININE: 0.8 mg/dL (ref 0.61–1.24)
Calcium: 8.5 mg/dL — ABNORMAL LOW (ref 8.9–10.3)
Glucose, Bld: 133 mg/dL — ABNORMAL HIGH (ref 65–99)
POTASSIUM: 3.8 mmol/L (ref 3.5–5.1)
SODIUM: 139 mmol/L (ref 135–145)

## 2017-05-22 LAB — GLUCOSE, CAPILLARY
GLUCOSE-CAPILLARY: 140 mg/dL — AB (ref 65–99)
GLUCOSE-CAPILLARY: 166 mg/dL — AB (ref 65–99)
GLUCOSE-CAPILLARY: 169 mg/dL — AB (ref 65–99)
GLUCOSE-CAPILLARY: 155 mg/dL — AB (ref 65–99)
GLUCOSE-CAPILLARY: 183 mg/dL — AB (ref 65–99)
GLUCOSE-CAPILLARY: 138 mg/dL — AB (ref 65–99)

## 2017-05-22 LAB — HIV-1 RNA QUANT-NO REFLEX-BLD
HIV 1 RNA Quant: 83400 copies/mL
LOG10 HIV-1 RNA: 4.921 log10copy/mL

## 2017-05-22 LAB — PHOSPHORUS
PHOSPHORUS: 3.4 mg/dL (ref 2.5–4.6)
Phosphorus: 3.3 mg/dL (ref 2.5–4.6)

## 2017-05-22 LAB — PATHOLOGIST SMEAR REVIEW

## 2017-05-22 LAB — MAGNESIUM
MAGNESIUM: 1.9 mg/dL (ref 1.7–2.4)
MAGNESIUM: 1.9 mg/dL (ref 1.7–2.4)

## 2017-05-22 MED ORDER — FUROSEMIDE 10 MG/ML IJ SOLN
40.0000 mg | Freq: Two times a day (BID) | INTRAMUSCULAR | Status: AC
  Administered 2017-05-22 – 2017-05-24 (×4): 40 mg via INTRAVENOUS
  Filled 2017-05-22 (×4): qty 4

## 2017-05-22 NOTE — Progress Notes (Signed)
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 Draining clear CSF with pressure setting at 5 cm water.   Objective: Current vital signs: BP (!) 101/56   Pulse 100   Temp 97.3 F (36.3 C)   Resp 16   Ht _0  (1.854 m)   Wt 67.2 kg (148 lb 2.4 oz)   SpO2 98%   BMI 19.55 kg/m  Vital signs in last 24 hours: Temp:  [96.3 F (35.7 C)-99.6 F (37.6 C)] 97.3 F (36.3 C) (05/29 1200) Pulse Rate:  [83-131] 100 (05/29 1200) Resp:  [16-18] 16 (05/29 1200) BP: (90-115)/(55-79) 101/56 (05/29 1200) SpO2:  [98 %-100 %] 98 % (05/29 1200) FiO2 (%):  [40 %] 40 % (05/29 0743) Weight:  [67.2 kg (148 lb 2.4 oz)] 67.2 kg (148 lb 2.4 oz) (05/29 0354)  Intake/Output from previous day: 05/28 0701 - 05/29 0700 In: 4508 [I.V.:1400.5; NG/GT:1137.5; IV Piggyback:1970] Out: 2833 [Urine:2595; Drains:238] Intake/Output this shift: Total I/O In: 817 [I.V.:307; NG/GT:350; IV Piggyback:160] Out: 420 [Urine:370; Drains:50] Nutritional status: Diet NPO time specified  Neurologic Exam: Intubated and sedated. With sedation withdrawn, patient rapidly becomes agitated.  Right pupil 3 mm and sluggishly reactive. Left pupil 2 mm and sluggishly reactive. No doll's eye reflex (sedated). Face flaccidly symmetric.  Limbs flaccid x 4 in the context of sedation, with hypoactive reflexes. Begins to move extremities after sedation held.  Lab Results: Results for orders placed or performed during the hospital encounter of 05/20/17 (from the past 48 hour(s))  Blood Culture (routine x 2)     Status: Abnormal (Preliminary result)   Collection Time: 05/20/17 12:55 PM  Result Value Ref Range   Specimen Description BLOOD RIGHT ANTECUBITAL    Special Requests      BOTTLES DRAWN AEROBIC AND ANAEROBIC Blood Culture adequate volume   Culture  Setup Time      IN BOTH AEROBIC AND ANAEROBIC BOTTLES GRAM POSITIVE COCCI IN CHAINS CRITICAL RESULT CALLED TO, READ BACK BY AND VERIFIED WITH: TO GABBOTT(PHARMd)  BY TCLEVELAND 05/21/2017 AT 5:06AM    Culture (A)     STREPTOCOCCUS PNEUMONIAE SUSCEPTIBILITIES TO FOLLOW    Report Status PENDING   Fungus culture, blood     Status: None (Preliminary result)   Collection Time: 05/20/17 12:55 PM  Result Value Ref Range   Specimen Description BLOOD RIGHT ANTECUBITAL    Special Requests      BOTTLES DRAWN AEROBIC AND ANAEROBIC Blood Culture adequate volume   Culture NO GROWTH < 24 HOURS    Report Status PENDING   Blood Culture ID Panel (Reflexed)     Status: Abnormal   Collection Time: 05/20/17 12:55 PM  Result Value Ref Range   Enterococcus species NOT DETECTED NOT DETECTED   Listeria monocytogenes NOT DETECTED NOT DETECTED   Staphylococcus species NOT DETECTED NOT DETECTED   Staphylococcus aureus NOT DETECTED NOT DETECTED   Streptococcus species DETECTED (A) NOT DETECTED    Comment: CRITICAL RESULT CALLED TO, READ BACK BY AND VERIFIED WITH: TO GABBOTT(PHARMd) BY TCLEVELAND 05/21/2017 AT 5:06AM    Streptococcus agalactiae NOT DETECTED NOT DETECTED   Streptococcus pneumoniae DETECTED (A) NOT DETECTED    Comment: CRITICAL RESULT CALLED TO, READ BACK BY AND VERIFIED WITH: TO GABBOTT(PHARMd) BY TCLEVELAND 05/21/2017 AT 5:06AM    Streptococcus pyogenes NOT DETECTED NOT DETECTED   Acinetobacter baumannii NOT DETECTED NOT DETECTED   Enterobacteriaceae species NOT DETECTED NOT DETECTED   Enterobacter cloacae complex NOT DETECTED NOT DETECTED   Escherichia coli NOT DETECTED NOT  DETECTED   Klebsiella oxytoca NOT DETECTED NOT DETECTED   Klebsiella pneumoniae NOT DETECTED NOT DETECTED   Proteus species NOT DETECTED NOT DETECTED   Serratia marcescens NOT DETECTED NOT DETECTED   Haemophilus influenzae NOT DETECTED NOT DETECTED   Neisseria meningitidis NOT DETECTED NOT DETECTED   Pseudomonas aeruginosa NOT DETECTED NOT DETECTED   Candida albicans NOT DETECTED NOT DETECTED   Candida glabrata NOT DETECTED NOT DETECTED   Candida krusei NOT DETECTED NOT  DETECTED   Candida parapsilosis NOT DETECTED NOT DETECTED   Candida tropicalis NOT DETECTED NOT DETECTED  Comprehensive metabolic panel     Status: Abnormal   Collection Time: 05/20/17  1:08 PM  Result Value Ref Range   Sodium 137 135 - 145 mmol/L   Potassium 2.7 (LL) 3.5 - 5.1 mmol/L    Comment: CRITICAL RESULT CALLED TO, READ BACK BY AND VERIFIED WITH: B.ROWE,RN 05/20/17 _0  BY V.WILKINS    Chloride 102 101 - 111 mmol/L   CO2 23 22 - 32 mmol/L   Glucose, Bld 162 (H) 65 - 99 mg/dL   BUN 7 6 - 20 mg/dL   Creatinine, Ser 1.01 0.61 - 1.24 mg/dL   Calcium 9.6 8.9 - 10.3 mg/dL   Total Protein 9.0 (H) 6.5 - 8.1 g/dL   Albumin 3.0 (L) 3.5 - 5.0 g/dL   AST 59 (H) 15 - 41 U/L   ALT <5 (L) 17 - 63 U/L   Alkaline Phosphatase 140 (H) 38 - 126 U/L   Total Bilirubin 0.8 0.3 - 1.2 mg/dL   GFR calc non Af Amer >60 >60 mL/min   GFR calc Af Amer >60 >60 mL/min    Comment: (NOTE) The eGFR has been calculated using the CKD EPI equation. This calculation has not been validated in all clinical situations. eGFR's persistently <60 mL/min signify possible Chronic Kidney Disease.    Anion gap 12 5 - 15  CBC WITH DIFFERENTIAL     Status: Abnormal   Collection Time: 05/20/17  1:08 PM  Result Value Ref Range   WBC 7.1 4.0 - 10.5 K/uL   RBC 3.80 (L) 4.22 - 5.81 MIL/uL   Hemoglobin 11.5 (L) 13.0 - 17.0 g/dL   HCT 34.7 (L) 39.0 - 52.0 %   MCV 91.3 78.0 - 100.0 fL   MCH 30.3 26.0 - 34.0 pg   MCHC 33.1 30.0 - 36.0 g/dL   RDW 13.4 11.5 - 15.5 %   Platelets 383 150 - 400 K/uL   Neutrophils Relative % 78 %   Lymphocytes Relative 4 %   Monocytes Relative 18 %   Eosinophils Relative 0 %   Basophils Relative 0 %   Neutro Abs 5.5 1.7 - 7.7 K/uL   Lymphs Abs 0.3 (L) 0.7 - 4.0 K/uL   Monocytes Absolute 1.3 (H) 0.1 - 1.0 K/uL   Eosinophils Absolute 0.0 0.0 - 0.7 K/uL   Basophils Absolute 0.0 0.0 - 0.1 K/uL   WBC Morphology MILD LEFT SHIFT (1-5% METAS, OCC MYELO, OCC BANDS)   Magnesium     Status:  Abnormal   Collection Time: 05/20/17  1:08 PM  Result Value Ref Range   Magnesium 1.4 (L) 1.7 - 2.4 mg/dL  Protime-INR     Status: None   Collection Time: 05/20/17  1:08 PM  Result Value Ref Range   Prothrombin Time 14.6 11.4 - 15.2 seconds   INR 1.13   I-Stat CG4 Lactic Acid, ED  (not at  [facility])     Status: Abnormal  Collection Time: 05/20/17  1:25 PM  Result Value Ref Range   Lactic Acid, Venous 6.64 (HH) 0.5 - 1.9 mmol/L   Comment NOTIFIED PHYSICIAN   Blood Culture (routine x 2)     Status: None (Preliminary result)   Collection Time: 05/20/17  1:50 PM  Result Value Ref Range   Specimen Description BLOOD LEFT FOREARM    Special Requests      BOTTLES DRAWN AEROBIC AND ANAEROBIC Blood Culture results may not be optimal due to an inadequate volume of blood received in culture bottles   Culture NO GROWTH < 24 HOURS    Report Status PENDING   CSF cell count with differential collection tube #: 1     Status: Abnormal   Collection Time: 05/20/17  4:10 PM  Result Value Ref Range   Tube # 1    Color, CSF COLORLESS COLORLESS   Appearance, CSF CLEAR CLEAR   Supernatant NOT INDICATED    RBC Count, CSF 1 (H) 0 /cu mm   WBC, CSF 6 (H) 0 - 5 /cu mm   Segmented Neutrophils-CSF TOO FEW TO COUNT, SMEAR AVAILABLE FOR REVIEW 0 - 6 %    Comment: RARE   Lymphs, CSF FEW 40 - 80 %   Other Cells, CSF FUNGAL ELEMENTS PRESENT   CSF cell count with differential collection tube #: 4     Status: Abnormal   Collection Time: 05/20/17  4:10 PM  Result Value Ref Range   Tube # 4    Color, CSF COLORLESS COLORLESS   Appearance, CSF CLEAR CLEAR   Supernatant NOT INDICATED    RBC Count, CSF 3 (H) 0 /cu mm   WBC, CSF 14 (HH) 0 - 5 /cu mm    Comment: CRITICAL RESULT CALLED TO, READ BACK BY AND VERIFIED WITH: [facility] RN AT 1914 05/20/17 BY WOOLLENK    Segmented Neutrophils-CSF 5 0 - 6 %   Lymphs, CSF 95 (H) 40 - 80 %   Other Cells, CSF FUNGAL ELEMENTS PRESENT   CSF culture     Status: None  (Preliminary result)   Collection Time: 05/20/17  4:10 PM  Result Value Ref Range   Specimen Description CSF TUBE 2    Special Requests Immunocompromised    Gram Stain      WBC PRESENT, PREDOMINANTLY MONONUCLEAR YEAST CYTOSPIN SMEAR CRITICAL RESULT CALLED TO, READ BACK BY AND VERIFIED WITH: Debby Bud AT 1905 05/20/17 BY L BENFIELD    Culture NO GROWTH 2 DAYS    Report Status PENDING   Glucose, CSF     Status: None   Collection Time: 05/20/17  4:10 PM  Result Value Ref Range   Glucose, CSF 62 40 - 70 mg/dL  Protein, CSF     Status: None   Collection Time: 05/20/17  4:10 PM  Result Value Ref Range   Total  Protein, CSF 39 15 - 45 mg/dL  Herpes simplex virus (HSV), DNA by PCR Cerebrospinal Fluid     Status: None   Collection Time: 05/20/17  4:10 PM  Result Value Ref Range   Specimen source hsv CSF    HSV 1 DNA Negative Negative   HSV 2 DNA Negative Negative    Comment: (NOTE) This test was developed and its performance characteristics determined by [employer]. It has not been cleared or approved by the U.S. Food and Drug Administration. The FDA has determined that such clearance or approval is not necessary. This test is used for clinical purposes.  It should not be regarded as investigational or research. Performed At: [facility] [place], [place] [id number] Lindon Romp MD PP:[id number]   Cryptococcal antigen, CSF     Status: Abnormal   Collection Time: 05/20/17  4:10 PM  Result Value Ref Range   Crypto Ag POSITIVE (A) NEGATIVE    Comment: CRITICAL RESULT CALLED TO, READ BACK BY AND VERIFIED WITH: TO NPEARSE(RN)BY TCLEVELAND 05/21/2017 AT 00:22AM    Cryptococcal Ag Titer >2,560 (A) NOT INDICATED    Comment: CRITICAL RESULT CALLED TO, READ BACK BY AND VERIFIED WITH: TO NPEARSE(RN)BY TCLEVELAND 05/21/2017 AT 00:22AM   VDRL, CSF     Status: None   Collection Time: 05/20/17  4:10 PM  Result Value Ref Range   VDRL Quant, CSF Non  Reactive Non Rea:<1:1    Comment: (NOTE) Performed At: [facility] [address] [place], [place] [id number] Lindon Romp MD SN:[id number]   I-Stat arterial blood gas, ED     Status: Abnormal   Collection Time: 05/20/17  5:04 PM  Result Value Ref Range   pH, Arterial 7.561 (H) 7.350 - 7.450   pCO2 arterial 26.3 (L) 32.0 - 48.0 mmHg   pO2, Arterial 96.0 83.0 - 108.0 mmHg   Bicarbonate 23.7 20.0 - 28.0 mmol/L   TCO2 25 0 - 100 mmol/L   O2 Saturation 99.0 %   Acid-Base Excess 2.0 0.0 - 2.0 mmol/L   Patient temperature 97.7 F    Collection site RADIAL, ALLEN'S TEST ACCEPTABLE    Drawn by RT    Sample type ARTERIAL   Urinalysis, Routine w reflex microscopic     Status: None   Collection Time: 05/20/17  5:42 PM  Result Value Ref Range   Color, Urine YELLOW YELLOW   APPearance CLEAR CLEAR   Specific Gravity, Urine 1.020 1.005 - 1.030   pH 7.0 5.0 - 8.0   Glucose, UA NEGATIVE NEGATIVE mg/dL   Hgb urine dipstick NEGATIVE NEGATIVE   Bilirubin Urine NEGATIVE NEGATIVE   Ketones, ur NEGATIVE NEGATIVE mg/dL   Protein, ur NEGATIVE NEGATIVE mg/dL   Nitrite NEGATIVE NEGATIVE   Leukocytes, UA NEGATIVE NEGATIVE  MRSA PCR Screening     Status: None   Collection Time: 05/20/17  6:28 PM  Result Value Ref Range   MRSA by PCR NEGATIVE NEGATIVE    Comment:        The GeneXpert MRSA Assay (FDA approved for NASAL specimens only), is one component of a comprehensive MRSA colonization surveillance program. It is not intended to diagnose MRSA infection nor to guide or monitor treatment for MRSA infections.   CBC     Status: Abnormal   Collection Time: 05/20/17  7:00 PM  Result Value Ref Range   WBC 4.2 4.0 - 10.5 K/uL   RBC 3.71 (L) 4.22 - 5.81 MIL/uL   Hemoglobin 11.3 (L) 13.0 - 17.0 g/dL   HCT 33.5 (L) 39.0 - 52.0 %   MCV 90.3 78.0 - 100.0 fL   MCH 30.5 26.0 - 34.0 pg   MCHC 33.7 30.0 - 36.0 g/dL   RDW 13.4 11.5 - 15.5 %   Platelets 305 150 - 400 K/uL  Comprehensive  metabolic panel     Status: Abnormal   Collection Time: 05/20/17  7:00 PM  Result Value Ref Range   Sodium 135 135 - 145 mmol/L   Potassium 3.0 (L) 3.5 - 5.1 mmol/L   Chloride 104 101 - 111 mmol/L   CO2 24 22 -  32 mmol/L   Glucose, Bld 109 (H) 65 - 99 mg/dL   BUN 5 (L) 6 - 20 mg/dL   Creatinine, Ser 0.82 0.61 - 1.24 mg/dL   Calcium 8.9 8.9 - 10.3 mg/dL   Total Protein 8.1 6.5 - 8.1 g/dL   Albumin 2.6 (L) 3.5 - 5.0 g/dL   AST 44 (H) 15 - 41 U/L   ALT 21 17 - 63 U/L   Alkaline Phosphatase 128 (H) 38 - 126 U/L   Total Bilirubin 0.7 0.3 - 1.2 mg/dL   GFR calc non Af Amer >60 >60 mL/min   GFR calc Af Amer >60 >60 mL/min    Comment: (NOTE) The eGFR has been calculated using the CKD EPI equation. This calculation has not been validated in all clinical situations. eGFR's persistently <60 mL/min signify possible Chronic Kidney Disease.    Anion gap 7 5 - 15  Lactic acid, plasma     Status: Abnormal   Collection Time: 05/20/17  7:00 PM  Result Value Ref Range   Lactic Acid, Venous 2.3 (HH) 0.5 - 1.9 mmol/L    Comment: CRITICAL RESULT CALLED TO, READ BACK BY AND VERIFIED WITH: RN JENKINS,S AT 2002 [id number] MARTINB   Cortisol     Status: None   Collection Time: 05/20/17  7:00 PM  Result Value Ref Range   Cortisol, Plasma 12.1 ug/dL    Comment: (NOTE) AM    6.7 - 22.6 ug/dL PM   <10.0       ug/dL   Troponin I     Status: None   Collection Time: 05/20/17  7:00 PM  Result Value Ref Range   Troponin I <0.03 <0.03 ng/mL  Protime-INR     Status: Abnormal   Collection Time: 05/20/17  7:00 PM  Result Value Ref Range   Prothrombin Time 15.3 (H) 11.4 - 15.2 seconds   INR 1.20   Procalcitonin     Status: None   Collection Time: 05/20/17  7:00 PM  Result Value Ref Range   Procalcitonin 0.20 ng/mL    Comment:        Interpretation: PCT (Procalcitonin) <= 0.5 ng/mL: Systemic infection (sepsis) is not likely. Local bacterial infection is possible. (NOTE)         ICU PCT Algorithm                Non ICU PCT Algorithm    ----------------------------     ------------------------------         PCT < 0.25 ng/mL                 PCT < 0.1 ng/mL     Stopping of antibiotics            Stopping of antibiotics       strongly encouraged.               strongly encouraged.    ----------------------------     ------------------------------       PCT level decrease by               PCT < 0.25 ng/mL       >= 80% from peak PCT       OR PCT 0.25 - 0.5 ng/mL          Stopping of antibiotics  encouraged.     Stopping of antibiotics           encouraged.    ----------------------------     ------------------------------       PCT level decrease by              PCT >= 0.25 ng/mL       < 80% from peak PCT        AND PCT >= 0.5 ng/mL            Continuin g antibiotics                                              encouraged.       Continuing antibiotics            encouraged.    ----------------------------     ------------------------------     PCT level increase compared          PCT > 0.5 ng/mL         with peak PCT AND          PCT >= 0.5 ng/mL             Escalation of antibiotics                                          strongly encouraged.      Escalation of antibiotics        strongly encouraged.   APTT     Status: None   Collection Time: 05/20/17  7:00 PM  Result Value Ref Range   aPTT 34 24 - 36 seconds  Type and screen Rains     Status: None   Collection Time: 05/20/17  7:00 PM  Result Value Ref Range   ABO/RH(D) B NEG    Antibody Screen NEG    Sample Expiration 05/23/2017   Triglycerides     Status: None   Collection Time: 05/20/17  7:00 PM  Result Value Ref Range   Triglycerides 51 <150 mg/dL  ABO/Rh     Status: None   Collection Time: 05/20/17  7:02 PM  Result Value Ref Range   ABO/RH(D) B NEG   Glucose, capillary     Status: Abnormal   Collection Time: 05/20/17  8:13 PM  Result Value Ref Range    Glucose-Capillary 125 (H) 65 - 99 mg/dL  Lactic acid, plasma     Status: Abnormal   Collection Time: 05/20/17  9:34 PM  Result Value Ref Range   Lactic Acid, Venous 2.5 (HH) 0.5 - 1.9 mmol/L    Comment: CRITICAL RESULT CALLED TO, READ BACK BY AND VERIFIED WITH: JENKINS [facility] 05/20/17 2223 WAYK   Cd4/cd8 (t-helper/t-suppressor cell)     Status: Abnormal   Collection Time: 05/20/17  9:34 PM  Result Value Ref Range   Total lymphocyte count 360 (L) 1,000 - 4,000 /uL   CD4% <10 (L) 30.0 - 60.0 %   CD4 absolute 2 (L) 500 - 1,900 /uL   CD8tox 260 (H) 15.0 - 40.0 %   CD8 T Cell Abs 72 (L) 230 - 1,000 /uL   Ratio 0.003 (L) 1.0 - 3.0    Comment: Performed at [facility] [address] [place],  [place] [address]  Glucose, capillary     Status: Abnormal   Collection Time: 05/20/17 11:37 PM  Result Value Ref Range   Glucose-Capillary 147 (H) 65 - 99 mg/dL  CBC     Status: Abnormal   Collection Time: 05/21/17  3:23 AM  Result Value Ref Range   WBC 5.6 4.0 - 10.5 K/uL   RBC 3.77 (L) 4.22 - 5.81 MIL/uL   Hemoglobin 11.7 (L) 13.0 - 17.0 g/dL   HCT 34.4 (L) 39.0 - 52.0 %   MCV 91.2 78.0 - 100.0 fL   MCH 31.0 26.0 - 34.0 pg   MCHC 34.0 30.0 - 36.0 g/dL   RDW 13.9 11.5 - 15.5 %   Platelets 279 150 - 400 K/uL  Basic metabolic panel     Status: Abnormal   Collection Time: 05/21/17  3:23 AM  Result Value Ref Range   Sodium 138 135 - 145 mmol/L   Potassium 3.2 (L) 3.5 - 5.1 mmol/L   Chloride 108 101 - 111 mmol/L   CO2 22 22 - 32 mmol/L   Glucose, Bld 103 (H) 65 - 99 mg/dL   BUN 6 6 - 20 mg/dL   Creatinine, Ser 0.72 0.61 - 1.24 mg/dL   Calcium 9.0 8.9 - 10.3 mg/dL   GFR calc non Af Amer >60 >60 mL/min   GFR calc Af Amer >60 >60 mL/min    Comment: (NOTE) The eGFR has been calculated using the CKD EPI equation. This calculation has not been validated in all clinical situations. eGFR's persistently <60 mL/min signify possible Chronic Kidney Disease.    Anion gap 8 5 -  15  Magnesium     Status: None   Collection Time: 05/21/17  3:23 AM  Result Value Ref Range   Magnesium 1.9 1.7 - 2.4 mg/dL  Phosphorus     Status: None   Collection Time: 05/21/17  3:23 AM  Result Value Ref Range   Phosphorus 3.6 2.5 - 4.6 mg/dL  Glucose, capillary     Status: None   Collection Time: 05/21/17  3:27 AM  Result Value Ref Range   Glucose-Capillary 80 65 - 99 mg/dL  Blood gas, arterial     Status: Abnormal   Collection Time: 05/21/17  4:15 AM  Result Value Ref Range   FIO2 60.00    Delivery systems VENTILATOR    Mode PRESSURE REGULATED VOLUME CONTROL    VT 640 mL   LHR 16 resp/min   Peep/cpap 5.0 cm H20   pH, Arterial 7.535 (H) 7.350 - 7.450   pCO2 arterial 26.3 (L) 32.0 - 48.0 mmHg   pO2, Arterial 219 (H) 83.0 - 108.0 mmHg   Bicarbonate 22.1 20.0 - 28.0 mmol/L   Acid-base deficit 0.3 0.0 - 2.0 mmol/L   O2 Saturation 99.5 %   Patient temperature 98.6    Collection site RIGHT RADIAL    Drawn by [phone]    Sample type ARTERIAL DRAW    Allens test (pass/fail) PASS PASS  Glucose, capillary     Status: Abnormal   Collection Time: 05/21/17  8:24 AM  Result Value Ref Range   Glucose-Capillary 128 (H) 65 - 99 mg/dL   Comment 1 Notify RN    Comment 2 Document in Chart   Glucose, capillary     Status: Abnormal   Collection Time: 05/21/17 12:05 PM  Result Value Ref Range   Glucose-Capillary 106 (H) 65 - 99 mg/dL  Culture, respiratory (NON-Expectorated)     Status: None (Preliminary  result)   Collection Time: 05/21/17 12:10 PM  Result Value Ref Range   Specimen Description TRACHEAL ASPIRATE    Special Requests NONE    Gram Stain      MODERATE WBC PRESENT,BOTH PMN AND MONONUCLEAR NO ORGANISMS SEEN    Culture CULTURE REINCUBATED FOR BETTER GROWTH    Report Status PENDING   Magnesium     Status: None   Collection Time: 05/21/17 12:49 PM  Result Value Ref Range   Magnesium 1.8 1.7 - 2.4 mg/dL  Phosphorus     Status: None   Collection Time: 05/21/17 12:49 PM   Result Value Ref Range   Phosphorus 3.6 2.5 - 4.6 mg/dL  Glucose, capillary     Status: Abnormal   Collection Time: 05/21/17  4:24 PM  Result Value Ref Range   Glucose-Capillary 135 (H) 65 - 99 mg/dL   Comment 1 Notify RN    Comment 2 Document in Chart   Magnesium     Status: None   Collection Time: 05/21/17  4:43 PM  Result Value Ref Range   Magnesium 1.7 1.7 - 2.4 mg/dL  Phosphorus     Status: None   Collection Time: 05/21/17  4:43 PM  Result Value Ref Range   Phosphorus 3.0 2.5 - 4.6 mg/dL  Glucose, capillary     Status: Abnormal   Collection Time: 05/21/17  7:56 PM  Result Value Ref Range   Glucose-Capillary 191 (H) 65 - 99 mg/dL  Glucose, capillary     Status: Abnormal   Collection Time: 05/21/17 11:33 PM  Result Value Ref Range   Glucose-Capillary 137 (H) 65 - 99 mg/dL  Blood gas, arterial     Status: Abnormal   Collection Time: 05/22/17  3:50 AM  Result Value Ref Range   FIO2 40.00    Delivery systems VENTILATOR    Mode PRESSURE REGULATED VOLUME CONTROL    VT 640 mL   LHR 16 resp/min   Peep/cpap 5.0 cm H20   pH, Arterial 7.492 (H) 7.350 - 7.450   pCO2 arterial 27.7 (L) 32.0 - 48.0 mmHg   pO2, Arterial 171 (H) 83.0 - 108.0 mmHg   Bicarbonate 21.0 20.0 - 28.0 mmol/L   Acid-base deficit 1.9 0.0 - 2.0 mmol/L   O2 Saturation 99.3 %   Patient temperature 98.6    Collection site LEFT BRACHIAL    Drawn by [phone]    Sample type ARTERIAL DRAW    Allens test (pass/fail) PASS PASS  Glucose, capillary     Status: Abnormal   Collection Time: 05/22/17  4:01 AM  Result Value Ref Range   Glucose-Capillary 140 (H) 65 - 99 mg/dL  CBC     Status: Abnormal   Collection Time: 05/22/17  4:40 AM  Result Value Ref Range   WBC 4.6 4.0 - 10.5 K/uL   RBC 3.44 (L) 4.22 - 5.81 MIL/uL   Hemoglobin 10.0 (L) 13.0 - 17.0 g/dL   HCT 31.1 (L) 39.0 - 52.0 %   MCV 90.4 78.0 - 100.0 fL   MCH 29.1 26.0 - 34.0 pg   MCHC 32.2 30.0 - 36.0 g/dL   RDW 13.8 11.5 - 15.5 %   Platelets 193 150 -  400 K/uL  Basic metabolic panel     Status: Abnormal   Collection Time: 05/22/17  4:40 AM  Result Value Ref Range   Sodium 139 135 - 145 mmol/L   Potassium 3.8 3.5 - 5.1 mmol/L   Chloride 111 101 - 111 mmol/L  CO2 22 22 - 32 mmol/L   Glucose, Bld 133 (H) 65 - 99 mg/dL   BUN 15 6 - 20 mg/dL   Creatinine, Ser 0.80 0.61 - 1.24 mg/dL   Calcium 8.5 (L) 8.9 - 10.3 mg/dL   GFR calc non Af Amer >60 >60 mL/min   GFR calc Af Amer >60 >60 mL/min    Comment: (NOTE) The eGFR has been calculated using the CKD EPI equation. This calculation has not been validated in all clinical situations. eGFR's persistently <60 mL/min signify possible Chronic Kidney Disease.    Anion gap 6 5 - 15  Magnesium     Status: None   Collection Time: 05/22/17  4:40 AM  Result Value Ref Range   Magnesium 1.9 1.7 - 2.4 mg/dL  Phosphorus     Status: None   Collection Time: 05/22/17  4:40 AM  Result Value Ref Range   Phosphorus 3.4 2.5 - 4.6 mg/dL  Glucose, capillary     Status: Abnormal   Collection Time: 05/22/17  8:36 AM  Result Value Ref Range   Glucose-Capillary 166 (H) 65 - 99 mg/dL  Glucose, capillary     Status: Abnormal   Collection Time: 05/22/17 11:51 AM  Result Value Ref Range   Glucose-Capillary 169 (H) 65 - 99 mg/dL   Comment 1 Notify RN    Comment 2 Document in Chart     Recent Results (from the past 240 hour(s))  Blood Culture (routine x 2)     Status: Abnormal (Preliminary result)   Collection Time: 05/20/17 12:55 PM  Result Value Ref Range Status   Specimen Description BLOOD RIGHT ANTECUBITAL  Final   Special Requests   Final    BOTTLES DRAWN AEROBIC AND ANAEROBIC Blood Culture adequate volume   Culture  Setup Time   Final    IN BOTH AEROBIC AND ANAEROBIC BOTTLES GRAM POSITIVE COCCI IN CHAINS CRITICAL RESULT CALLED TO, READ BACK BY AND VERIFIED WITH: TO GABBOTT(PHARMd) BY TCLEVELAND 05/21/2017 AT 5:06AM    Culture (A)  Final    STREPTOCOCCUS PNEUMONIAE SUSCEPTIBILITIES TO FOLLOW     Report Status PENDING  Incomplete  Fungus culture, blood     Status: None (Preliminary result)   Collection Time: 05/20/17 12:55 PM  Result Value Ref Range Status   Specimen Description BLOOD RIGHT ANTECUBITAL  Final   Special Requests   Final    BOTTLES DRAWN AEROBIC AND ANAEROBIC Blood Culture adequate volume   Culture NO GROWTH < 24 HOURS  Final   Report Status PENDING  Incomplete  Blood Culture ID Panel (Reflexed)     Status: Abnormal   Collection Time: 05/20/17 12:55 PM  Result Value Ref Range Status   Enterococcus species NOT DETECTED NOT DETECTED Final   Listeria monocytogenes NOT DETECTED NOT DETECTED Final   Staphylococcus species NOT DETECTED NOT DETECTED Final   Staphylococcus aureus NOT DETECTED NOT DETECTED Final   Streptococcus species DETECTED (A) NOT DETECTED Final    Comment: CRITICAL RESULT CALLED TO, READ BACK BY AND VERIFIED WITH: TO GABBOTT(PHARMd) BY TCLEVELAND 05/21/2017 AT 5:06AM    Streptococcus agalactiae NOT DETECTED NOT DETECTED Final   Streptococcus pneumoniae DETECTED (A) NOT DETECTED Final    Comment: CRITICAL RESULT CALLED TO, READ BACK BY AND VERIFIED WITH: TO GABBOTT(PHARMd) BY TCLEVELAND 05/21/2017 AT 5:06AM    Streptococcus pyogenes NOT DETECTED NOT DETECTED Final   Acinetobacter baumannii NOT DETECTED NOT DETECTED Final   Enterobacteriaceae species NOT DETECTED NOT DETECTED Final   Enterobacter cloacae  complex NOT DETECTED NOT DETECTED Final   Escherichia coli NOT DETECTED NOT DETECTED Final   Klebsiella oxytoca NOT DETECTED NOT DETECTED Final   Klebsiella pneumoniae NOT DETECTED NOT DETECTED Final   Proteus species NOT DETECTED NOT DETECTED Final   Serratia marcescens NOT DETECTED NOT DETECTED Final   Haemophilus influenzae NOT DETECTED NOT DETECTED Final   Neisseria meningitidis NOT DETECTED NOT DETECTED Final   Pseudomonas aeruginosa NOT DETECTED NOT DETECTED Final   Candida albicans NOT DETECTED NOT DETECTED Final   Candida glabrata NOT  DETECTED NOT DETECTED Final   Candida krusei NOT DETECTED NOT DETECTED Final   Candida parapsilosis NOT DETECTED NOT DETECTED Final   Candida tropicalis NOT DETECTED NOT DETECTED Final  Blood Culture (routine x 2)     Status: None (Preliminary result)   Collection Time: 05/20/17  1:50 PM  Result Value Ref Range Status   Specimen Description BLOOD LEFT FOREARM  Final   Special Requests   Final    BOTTLES DRAWN AEROBIC AND ANAEROBIC Blood Culture results may not be optimal due to an inadequate volume of blood received in culture bottles   Culture NO GROWTH < 24 HOURS  Final   Report Status PENDING  Incomplete  CSF culture     Status: None (Preliminary result)   Collection Time: 05/20/17  4:10 PM  Result Value Ref Range Status   Specimen Description CSF TUBE 2  Final   Special Requests Immunocompromised  Final   Gram Stain   Final    WBC PRESENT, PREDOMINANTLY MONONUCLEAR YEAST CYTOSPIN SMEAR CRITICAL RESULT CALLED TO, READ BACK BY AND VERIFIED WITH: Debby Bud AT 1905 05/20/17 BY L BENFIELD    Culture NO GROWTH 2 DAYS  Final   Report Status PENDING  Incomplete  MRSA PCR Screening     Status: None   Collection Time: 05/20/17  6:28 PM  Result Value Ref Range Status   MRSA by PCR NEGATIVE NEGATIVE Final    Comment:        The GeneXpert MRSA Assay (FDA approved for NASAL specimens only), is one component of a comprehensive MRSA colonization surveillance program. It is not intended to diagnose MRSA infection nor to guide or monitor treatment for MRSA infections.   Culture, respiratory (NON-Expectorated)     Status: None (Preliminary result)   Collection Time: 05/21/17 12:10 PM  Result Value Ref Range Status   Specimen Description TRACHEAL ASPIRATE  Final   Special Requests NONE  Final   Gram Stain   Final    MODERATE WBC PRESENT,BOTH PMN AND MONONUCLEAR NO ORGANISMS SEEN    Culture CULTURE REINCUBATED FOR BETTER GROWTH  Final   Report Status PENDING  Incomplete    Lipid  Panel  Recent Labs  05/20/17 1900  TRIG 51    Studies/Results: Ct Head Wo Contrast  Result Date: 05/21/2017 CLINICAL DATA:  Headaches and recent seizure activity EXAM: CT HEAD WITHOUT CONTRAST TECHNIQUE: Contiguous axial images were obtained from the base of the skull through the vertex without intravenous contrast. COMPARISON:  05/20/2017 FINDINGS: Brain: No findings to suggest acute hemorrhage, acute infarction or space-occupying mass lesion are noted. There is again noted some mild decreased attenuation in the midportion of the right internal capsule stable from the previous exam. No new focal abnormality is noted. Vascular: No hyperdense vessel or unexpected calcification. Skull: Normal. Negative for fracture or focal lesion. Sinuses/Orbits: No acute finding. Other: None. IMPRESSION: Stable changes from the previous day.  No acute abnormality noted. Electronically  Signed   By: Inez Catalina M.D.   On: 05/21/2017 09:42   Ct Head W Or Wo Contrast  Result Date: 05/20/2017 CLINICAL DATA:  New onset seizure.  History of HIV. EXAM: CT HEAD WITHOUT AND WITH CONTRAST TECHNIQUE: Contiguous axial images were obtained from the base of the skull through the vertex without and with intravenous contrast CONTRAST:  75 mL [id number] COMPARISON:  Noncontrast head CT 05/18/2017 FINDINGS: Brain: There is no evidence of acute infarct, intracranial hemorrhage, mass, midline shift, or extra-axial fluid collection. Minimal prominence of the lateral ventricles for age is unchanged. A small amount of subtle hypoattenuation in the right internal capsule is unchanged and nonspecific. No abnormal enhancement is identified. Vascular: Visible vessels are patent. Skull: No fracture or focal osseous lesion. Sinuses/Orbits: No significant inflammatory disease in the imaged portions of the paranasal sinuses and mastoid air cells. Unremarkable orbits. Other: None. IMPRESSION: 1. No acute intracranial abnormality identified. 2.  Minimal unchanged hypoattenuation in the right internal capsule, nonspecific. Electronically Signed   By: Logan Bores M.D.   On: 05/20/2017 15:29   Dg Chest Port 1 View  Result Date: 05/22/2017 CLINICAL DATA:  Intubation.  Seizure. EXAM: PORTABLE CHEST 1 VIEW COMPARISON:  05/21/2017 . FINDINGS: Endotracheal tube and NG tube in stable position. Low lung volumes. Mild bibasilar infiltrates. No pleural effusion or pneumothorax. Heart size normal. IMPRESSION: 1. Endotracheal tube and NG tube in stable position. 2.  Low lung volumes.  Mild bibasilar infiltrates. Electronically Signed   By: Marcello Moores  Register   On: 05/22/2017 07:19   Dg Chest Port 1 View  Result Date: 05/21/2017 CLINICAL DATA:  Check endotracheal tube placement EXAM: PORTABLE CHEST 1 VIEW COMPARISON:  05/20/2017 FINDINGS: Cardiac shadow is within normal limits. Endotracheal tube and nasogastric catheter are noted in satisfactory position. The lungs are clear bilaterally. No bony abnormality is noted. IMPRESSION: Tubes and lines as described.  No acute abnormality noted. Electronically Signed   By: Inez Catalina M.D.   On: 05/21/2017 08:17   Dg Chest Portable 1 View  Result Date: 05/20/2017 CLINICAL DATA:  Evaluate ETT placement EXAM: PORTABLE CHEST 1 VIEW COMPARISON:  May 18, 2017 FINDINGS: The ETT is in good position. The OG tube terminates below today's film. No pneumothorax. The lungs are clear. No other abnormalities. IMPRESSION: Support apparatus placement as above. Electronically Signed   By: Dorise Bullion III M.D   On: 05/20/2017 14:36    Medications:  Scheduled: . acetaminophen  650 mg Oral Q24H  . chlorhexidine gluconate (MEDLINE KIT)  15 mL Mouth Rinse BID  . dexamethasone  4 mg Intravenous Q4H  . diphenhydrAMINE  25 mg Oral Q24H  . feeding supplement (PRO-STAT SUGAR FREE 64)  30 mL Per Tube BID  . flucytosine  1,500 mg Per Tube Q6H  . insulin aspart  2-6 Units Subcutaneous Q4H  . mouth rinse  15 mL Mouth Rinse 10 times  per day  . 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  1. With his CNS infection, immunocompromised state and bacteremia, there is concern for possible bacterial involvement in his CSF is well, cultures are pending.  2. Though the exact mechanism behind his appearance is unclear, he very much has the appearance of someone with uncal herniation, but his CT did not support this. 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 An MRI is needed to further assess.  3. EEG with no seizures or epileptiform discharges.  Recommendations: 1. Continue Keppra 1000 mg IV BID 2. Appreciate neurosurgery assistance. Ventricular drain placed with pressure setting at 5 cm water 3. Long term EEG discontinued.  4. Continue flucotysine per tube and IV amphotericin B. Also on Decadron 4 mg IV q4h. ID is following.  5. Obtain MRI brain with and without contrast.    LOS: 2 days   _0  signed: Dr. Kerney Elbe 05/22/2017  12:22 PM

## 2017-05-22 NOTE — H&P (Addendum)
Justin Ball is an 26 y.o. male.   Chief Complaint: Headache, seizures HPI:   Patient intubated sedated so history obtained from ER physician  Patient has been having headache for past few days. He was seen in ER 2 days ago and discharged. Today he had witnessed seizures by family and repeat in ER. When brought in to ER, he was post ictal but did follow some commands. Then he had repeat seizures and was intubated. He had disconjugate eyes which resolved.   He has also been having fevers lately. Grandmother expressed concern of his compliance with HIV meds.   26 year old male with history of HIV AIDS with history of noncompliance here with altered mental status. History is limited as patient was pulled out of his car unresponsive. Family did report that he thought the patient had a seizure. On arrival, patient is postictal and unable to provide history.  Per review of records, patient was seen 3 days ago in the ER for headache. He was afebrile and otherwise very well-appearing at that time. He had a CT scan which was negative and sent home with supportive care.  Subjective: Remains sedated now with IVC drain.  Past Medical History:  Diagnosis Date  . Anxiety   . Bipolar 1 disorder ([place])   . Depression   . HIV infection ([place])   . Neuromuscular disorder ([place])   . Scabies   . 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 Substance abuse   . Syphilis     Past Surgical History:  Procedure Laterality Date  . EXAMINATION UNDER ANESTHESIA N/A 12/28/2014   Procedure: EXAM UNDER ANESTHESIA;  Surgeon: Michael Boston, MD;  Location: [facility];  Service: General;  Laterality: N/A;  . FLOOR OF MOUTH BIOPSY N/A 09/16/2016   Procedure: BIOPSY OF ORAL ABCESS;  Surgeon: Leta Baptist, MD;  Location: [facility];  Service: ENT;  Laterality: N/A;  . INCISION AND DRAINAGE ABSCESS N/A 09/16/2016   Procedure: INCISION AND DRAINAGE ABSCESS;  Surgeon: Leta Baptist, MD;  Location: [facility];  Service: ENT;  Laterality: N/A;  . INCISION AND  DRAINAGE PERIRECTAL ABSCESS  08/27/2010   Dr Marlou Starks  . INCISION AND DRAINAGE PERIRECTAL ABSCESS N/A 12/28/2014   Procedure: IRRIGATION AND DEBRIDEMENT PERIRECTAL ABSCESS;  Surgeon: Michael Boston, MD;  Location: [facility];  Service: General;  Laterality: N/A;  Fistula repair and ablation   Family History  Problem Relation Age of Onset  . Hypertension Other    Social History:  reports that he has been smoking Cigarettes.  He has a 1.25 pack-year smoking history. He has never used smokeless tobacco. He reports that he drinks alcohol. He reports that he does not use drugs.  Allergies: No Known Allergies  Medications Prior to Admission  Medication Sig Dispense Refill  . dolutegravir (TIVICAY) 50 MG tablet Take 1 tablet (50 mg total) by mouth daily. 30 tablet 6  . emtricitabine-rilpivir-tenofovir AF (ODEFSEY) 200-25-25 MG TABS tablet Take 1 tablet by mouth daily with breakfast. 30 tablet 6  . naproxen sodium (ANAPROX) 220 MG tablet Take 220 mg by mouth 2 (two) times daily as needed (pain).    Marland Kitchen sulfamethoxazole-trimethoprim (BACTRIM) 400-80 MG tablet Take 1 tablet by mouth daily. 30 tablet 11    Results for orders placed or performed during the hospital encounter of 05/20/17 (from the past 48 hour(s))  Blood Culture (routine x 2)     Status: Abnormal (Preliminary result)   Collection Time: 05/20/17 12:55 PM  Result Value Ref Range   Specimen  Description BLOOD RIGHT ANTECUBITAL    Special Requests      BOTTLES DRAWN AEROBIC AND ANAEROBIC Blood Culture adequate volume   Culture  Setup Time      IN BOTH AEROBIC AND ANAEROBIC BOTTLES GRAM POSITIVE COCCI IN CHAINS CRITICAL RESULT CALLED TO, READ BACK BY AND VERIFIED WITH: TO GABBOTT(PHARMd) BY TCLEVELAND 05/21/2017 AT 5:06AM    Culture (A)     STREPTOCOCCUS PNEUMONIAE SUSCEPTIBILITIES TO FOLLOW    Report Status PENDING   Fungus culture, blood     Status: None (Preliminary result)   Collection Time: 05/20/17 12:55 PM  Result Value Ref Range    Specimen Description BLOOD RIGHT ANTECUBITAL    Special Requests      BOTTLES DRAWN AEROBIC AND ANAEROBIC Blood Culture adequate volume   Culture NO GROWTH < 24 HOURS    Report Status PENDING   Blood Culture ID Panel (Reflexed)     Status: Abnormal   Collection Time: 05/20/17 12:55 PM  Result Value Ref Range   Enterococcus species NOT DETECTED NOT DETECTED   Listeria monocytogenes NOT DETECTED NOT DETECTED   Staphylococcus species NOT DETECTED NOT DETECTED   Staphylococcus aureus NOT DETECTED NOT DETECTED   Streptococcus species DETECTED (A) NOT DETECTED    Comment: CRITICAL RESULT CALLED TO, READ BACK BY AND VERIFIED WITH: TO GABBOTT(PHARMd) BY TCLEVELAND 05/21/2017 AT 5:06AM    Streptococcus agalactiae NOT DETECTED NOT DETECTED   Streptococcus pneumoniae DETECTED (A) NOT DETECTED    Comment: CRITICAL RESULT CALLED TO, READ BACK BY AND VERIFIED WITH: TO GABBOTT(PHARMd) BY TCLEVELAND 05/21/2017 AT 5:06AM    Streptococcus pyogenes NOT DETECTED NOT DETECTED   Acinetobacter baumannii NOT DETECTED NOT DETECTED   Enterobacteriaceae species NOT DETECTED NOT DETECTED   Enterobacter cloacae complex NOT DETECTED NOT DETECTED   Escherichia coli NOT DETECTED NOT DETECTED   Klebsiella oxytoca NOT DETECTED NOT DETECTED   Klebsiella pneumoniae NOT DETECTED NOT DETECTED   Proteus species NOT DETECTED NOT DETECTED   Serratia marcescens NOT DETECTED NOT DETECTED   Haemophilus influenzae NOT DETECTED NOT DETECTED   Neisseria meningitidis NOT DETECTED NOT DETECTED   Pseudomonas aeruginosa NOT DETECTED NOT DETECTED   Candida albicans NOT DETECTED NOT DETECTED   Candida glabrata NOT DETECTED NOT DETECTED   Candida krusei NOT DETECTED NOT DETECTED   Candida parapsilosis NOT DETECTED NOT DETECTED   Candida tropicalis NOT DETECTED NOT DETECTED  Comprehensive metabolic panel     Status: Abnormal   Collection Time: 05/20/17  1:08 PM  Result Value Ref Range   Sodium 137 135 - 145 mmol/L   Potassium 2.7  (LL) 3.5 - 5.1 mmol/L    Comment: CRITICAL RESULT CALLED TO, READ BACK BY AND VERIFIED WITH: B.ROWE,RN 05/20/17 '@1410'  BY V.WILKINS    Chloride 102 101 - 111 mmol/L   CO2 23 22 - 32 mmol/L   Glucose, Bld 162 (H) 65 - 99 mg/dL   BUN 7 6 - 20 mg/dL   Creatinine, Ser 1.01 0.61 - 1.24 mg/dL   Calcium 9.6 8.9 - 10.3 mg/dL   Total Protein 9.0 (H) 6.5 - 8.1 g/dL   Albumin 3.0 (L) 3.5 - 5.0 g/dL   AST 59 (H) 15 - 41 U/L   ALT <5 (L) 17 - 63 U/L   Alkaline Phosphatase 140 (H) 38 - 126 U/L   Total Bilirubin 0.8 0.3 - 1.2 mg/dL   GFR calc non Af Amer >60 >60 mL/min   GFR calc Af Amer >60 >60 mL/min  Comment: (NOTE) The eGFR has been calculated using the CKD EPI equation. This calculation has not been validated in all clinical situations. eGFR's persistently <60 mL/min signify possible Chronic Kidney Disease.    Anion gap 12 5 - 15  CBC WITH DIFFERENTIAL     Status: Abnormal   Collection Time: 05/20/17  1:08 PM  Result Value Ref Range   WBC 7.1 4.0 - 10.5 K/uL   RBC 3.80 (L) 4.22 - 5.81 MIL/uL   Hemoglobin 11.5 (L) 13.0 - 17.0 g/dL   HCT 34.7 (L) 39.0 - 52.0 %   MCV 91.3 78.0 - 100.0 fL   MCH 30.3 26.0 - 34.0 pg   MCHC 33.1 30.0 - 36.0 g/dL   RDW 13.4 11.5 - 15.5 %   Platelets 383 150 - 400 K/uL   Neutrophils Relative % 78 %   Lymphocytes Relative 4 %   Monocytes Relative 18 %   Eosinophils Relative 0 %   Basophils Relative 0 %   Neutro Abs 5.5 1.7 - 7.7 K/uL   Lymphs Abs 0.3 (L) 0.7 - 4.0 K/uL   Monocytes Absolute 1.3 (H) 0.1 - 1.0 K/uL   Eosinophils Absolute 0.0 0.0 - 0.7 K/uL   Basophils Absolute 0.0 0.0 - 0.1 K/uL   WBC Morphology MILD LEFT SHIFT (1-5% METAS, OCC MYELO, OCC BANDS)   Magnesium     Status: Abnormal   Collection Time: 05/20/17  1:08 PM  Result Value Ref Range   Magnesium 1.4 (L) 1.7 - 2.4 mg/dL  Protime-INR     Status: None   Collection Time: 05/20/17  1:08 PM  Result Value Ref Range   Prothrombin Time 14.6 11.4 - 15.2 seconds   INR 1.13   I-Stat CG4  Lactic Acid, ED  (not at  [facility])     Status: Abnormal   Collection Time: 05/20/17  1:25 PM  Result Value Ref Range   Lactic Acid, Venous 6.64 (HH) 0.5 - 1.9 mmol/L   Comment NOTIFIED PHYSICIAN   Blood Culture (routine x 2)     Status: None (Preliminary result)   Collection Time: 05/20/17  1:50 PM  Result Value Ref Range   Specimen Description BLOOD LEFT FOREARM    Special Requests      BOTTLES DRAWN AEROBIC AND ANAEROBIC Blood Culture results may not be optimal due to an inadequate volume of blood received in culture bottles   Culture NO GROWTH < 24 HOURS    Report Status PENDING   CSF cell count with differential collection tube #: 1     Status: Abnormal   Collection Time: 05/20/17  4:10 PM  Result Value Ref Range   Tube # 1    Color, CSF COLORLESS COLORLESS   Appearance, CSF CLEAR CLEAR   Supernatant NOT INDICATED    RBC Count, CSF 1 (H) 0 /cu mm   WBC, CSF 6 (H) 0 - 5 /cu mm   Segmented Neutrophils-CSF TOO FEW TO COUNT, SMEAR AVAILABLE FOR REVIEW 0 - 6 %    Comment: RARE   Lymphs, CSF FEW 40 - 80 %   Other Cells, CSF FUNGAL ELEMENTS PRESENT   CSF cell count with differential collection tube #: 4     Status: Abnormal   Collection Time: 05/20/17  4:10 PM  Result Value Ref Range   Tube # 4    Color, CSF COLORLESS COLORLESS   Appearance, CSF CLEAR CLEAR   Supernatant NOT INDICATED    RBC Count, CSF 3 (H) 0 /cu  mm   WBC, CSF 14 (HH) 0 - 5 /cu mm    Comment: CRITICAL RESULT CALLED TO, READ BACK BY AND VERIFIED WITH: [facility] RN AT 1914 05/20/17 BY WOOLLENK    Segmented Neutrophils-CSF 5 0 - 6 %   Lymphs, CSF 95 (H) 40 - 80 %   Other Cells, CSF FUNGAL ELEMENTS PRESENT   CSF culture     Status: None (Preliminary result)   Collection Time: 05/20/17  4:10 PM  Result Value Ref Range   Specimen Description CSF TUBE 2    Special Requests Immunocompromised    Gram Stain      WBC PRESENT, PREDOMINANTLY MONONUCLEAR YEAST CYTOSPIN SMEAR CRITICAL RESULT CALLED TO, READ BACK  BY AND VERIFIED WITH: Debby Bud AT 1905 05/20/17 BY L BENFIELD    Culture NO GROWTH 2 DAYS    Report Status PENDING   Glucose, CSF     Status: None   Collection Time: 05/20/17  4:10 PM  Result Value Ref Range   Glucose, CSF 62 40 - 70 mg/dL  Protein, CSF     Status: None   Collection Time: 05/20/17  4:10 PM  Result Value Ref Range   Total  Protein, CSF 39 15 - 45 mg/dL  Herpes simplex virus (HSV), DNA by PCR Cerebrospinal Fluid     Status: None   Collection Time: 05/20/17  4:10 PM  Result Value Ref Range   Specimen source hsv CSF    HSV 1 DNA Negative Negative   HSV 2 DNA Negative Negative    Comment: (NOTE) This test was developed and its performance characteristics determined by [employer]. It has not been cleared or approved by the U.S. Food and Drug Administration. The FDA has determined that such clearance or approval is not necessary. This test is used for clinical purposes. It should not be regarded as investigational or research. Performed At: [facility] [place], [place] [id number] Lindon Romp MD AV:[id number]   Cryptococcal antigen, CSF     Status: Abnormal   Collection Time: 05/20/17  4:10 PM  Result Value Ref Range   Crypto Ag POSITIVE (A) NEGATIVE    Comment: CRITICAL RESULT CALLED TO, READ BACK BY AND VERIFIED WITH: TO NPEARSE(RN)BY TCLEVELAND 05/21/2017 AT 00:22AM    Cryptococcal Ag Titer >2,560 (A) NOT INDICATED    Comment: CRITICAL RESULT CALLED TO, READ BACK BY AND VERIFIED WITH: TO NPEARSE(RN)BY TCLEVELAND 05/21/2017 AT 00:22AM   VDRL, CSF     Status: None   Collection Time: 05/20/17  4:10 PM  Result Value Ref Range   VDRL Quant, CSF Non Reactive Non Rea:<1:1    Comment: (NOTE) Performed At: [facility] [place], [place] [id number] Lindon Romp MD EM:[id number]   I-Stat arterial blood gas, ED     Status: Abnormal   Collection Time: 05/20/17  5:04 PM  Result Value Ref Range   pH,  Arterial 7.561 (H) 7.350 - 7.450   pCO2 arterial 26.3 (L) 32.0 - 48.0 mmHg   pO2, Arterial 96.0 83.0 - 108.0 mmHg   Bicarbonate 23.7 20.0 - 28.0 mmol/L   TCO2 25 0 - 100 mmol/L   O2 Saturation 99.0 %   Acid-Base Excess 2.0 0.0 - 2.0 mmol/L   Patient temperature 97.7 F    Collection site RADIAL, ALLEN'S TEST ACCEPTABLE    Drawn by RT    Sample type ARTERIAL   Urinalysis, Routine w reflex microscopic     Status: None  Collection Time: 05/20/17  5:42 PM  Result Value Ref Range   Color, Urine YELLOW YELLOW   APPearance CLEAR CLEAR   Specific Gravity, Urine 1.020 1.005 - 1.030   pH 7.0 5.0 - 8.0   Glucose, UA NEGATIVE NEGATIVE mg/dL   Hgb urine dipstick NEGATIVE NEGATIVE   Bilirubin Urine NEGATIVE NEGATIVE   Ketones, ur NEGATIVE NEGATIVE mg/dL   Protein, ur NEGATIVE NEGATIVE mg/dL   Nitrite NEGATIVE NEGATIVE   Leukocytes, UA NEGATIVE NEGATIVE  MRSA PCR Screening     Status: None   Collection Time: 05/20/17  6:28 PM  Result Value Ref Range   MRSA by PCR NEGATIVE NEGATIVE    Comment:        The GeneXpert MRSA Assay (FDA approved for NASAL specimens only), is one component of a comprehensive MRSA colonization surveillance program. It is not intended to diagnose MRSA infection nor to guide or monitor treatment for MRSA infections.   CBC     Status: Abnormal   Collection Time: 05/20/17  7:00 PM  Result Value Ref Range   WBC 4.2 4.0 - 10.5 K/uL   RBC 3.71 (L) 4.22 - 5.81 MIL/uL   Hemoglobin 11.3 (L) 13.0 - 17.0 g/dL   HCT 33.5 (L) 39.0 - 52.0 %   MCV 90.3 78.0 - 100.0 fL   MCH 30.5 26.0 - 34.0 pg   MCHC 33.7 30.0 - 36.0 g/dL   RDW 13.4 11.5 - 15.5 %   Platelets 305 150 - 400 K/uL  Comprehensive metabolic panel     Status: Abnormal   Collection Time: 05/20/17  7:00 PM  Result Value Ref Range   Sodium 135 135 - 145 mmol/L   Potassium 3.0 (L) 3.5 - 5.1 mmol/L   Chloride 104 101 - 111 mmol/L   CO2 24 22 - 32 mmol/L   Glucose, Bld 109 (H) 65 - 99 mg/dL   BUN 5 (L) 6 - 20  mg/dL   Creatinine, Ser 0.82 0.61 - 1.24 mg/dL   Calcium 8.9 8.9 - 10.3 mg/dL   Total Protein 8.1 6.5 - 8.1 g/dL   Albumin 2.6 (L) 3.5 - 5.0 g/dL   AST 44 (H) 15 - 41 U/L   ALT 21 17 - 63 U/L   Alkaline Phosphatase 128 (H) 38 - 126 U/L   Total Bilirubin 0.7 0.3 - 1.2 mg/dL   GFR calc non Af Amer >60 >60 mL/min   GFR calc Af Amer >60 >60 mL/min    Comment: (NOTE) The eGFR has been calculated using the CKD EPI equation. This calculation has not been validated in all clinical situations. eGFR's persistently <60 mL/min signify possible Chronic Kidney Disease.    Anion gap 7 5 - 15  Lactic acid, plasma     Status: Abnormal   Collection Time: 05/20/17  7:00 PM  Result Value Ref Range   Lactic Acid, Venous 2.3 (HH) 0.5 - 1.9 mmol/L    Comment: CRITICAL RESULT CALLED TO, READ BACK BY AND VERIFIED WITH: RN JENKINS,S AT 2002 [id number] MARTINB   Cortisol     Status: None   Collection Time: 05/20/17  7:00 PM  Result Value Ref Range   Cortisol, Plasma 12.1 ug/dL    Comment: (NOTE) AM    6.7 - 22.6 ug/dL PM   <10.0       ug/dL   Troponin I     Status: None   Collection Time: 05/20/17  7:00 PM  Result Value Ref Range   Troponin I <  0.03 <0.03 ng/mL  Protime-INR     Status: Abnormal   Collection Time: 05/20/17  7:00 PM  Result Value Ref Range   Prothrombin Time 15.3 (H) 11.4 - 15.2 seconds   INR 1.20   Procalcitonin     Status: None   Collection Time: 05/20/17  7:00 PM  Result Value Ref Range   Procalcitonin 0.20 ng/mL    Comment:        Interpretation: PCT (Procalcitonin) <= 0.5 ng/mL: Systemic infection (sepsis) is not likely. Local bacterial infection is possible. (NOTE)         ICU PCT Algorithm               Non ICU PCT Algorithm    ----------------------------     ------------------------------         PCT < 0.25 ng/mL                 PCT < 0.1 ng/mL     Stopping of antibiotics            Stopping of antibiotics       strongly encouraged.               strongly  encouraged.    ----------------------------     ------------------------------       PCT level decrease by               PCT < 0.25 ng/mL       >= 80% from peak PCT       OR PCT 0.25 - 0.5 ng/mL          Stopping of antibiotics                                             encouraged.     Stopping of antibiotics           encouraged.    ----------------------------     ------------------------------       PCT level decrease by              PCT >= 0.25 ng/mL       < 80% from peak PCT        AND PCT >= 0.5 ng/mL            Continuin g antibiotics                                              encouraged.       Continuing antibiotics            encouraged.    ----------------------------     ------------------------------     PCT level increase compared          PCT > 0.5 ng/mL         with peak PCT AND          PCT >= 0.5 ng/mL             Escalation of antibiotics                                          strongly encouraged.  Escalation of antibiotics        strongly encouraged.   APTT     Status: None   Collection Time: 05/20/17  7:00 PM  Result Value Ref Range   aPTT 34 24 - 36 seconds  Type and screen [place]     Status: None   Collection Time: 05/20/17  7:00 PM  Result Value Ref Range   ABO/RH(D) B NEG    Antibody Screen NEG    Sample Expiration 05/23/2017   Triglycerides     Status: None   Collection Time: 05/20/17  7:00 PM  Result Value Ref Range   Triglycerides 51 <150 mg/dL  ABO/Rh     Status: None   Collection Time: 05/20/17  7:02 PM  Result Value Ref Range   ABO/RH(D) B NEG   Glucose, capillary     Status: Abnormal   Collection Time: 05/20/17  8:13 PM  Result Value Ref Range   Glucose-Capillary 125 (H) 65 - 99 mg/dL  Lactic acid, plasma     Status: Abnormal   Collection Time: 05/20/17  9:34 PM  Result Value Ref Range   Lactic Acid, Venous 2.5 (HH) 0.5 - 1.9 mmol/L    Comment: CRITICAL RESULT CALLED TO, READ BACK BY AND VERIFIED  WITH: JENKINS [facility] 05/20/17 2223 WAYK   Cd4/cd8 (t-helper/t-suppressor cell)     Status: Abnormal   Collection Time: 05/20/17  9:34 PM  Result Value Ref Range   Total lymphocyte count 360 (L) 1,000 - 4,000 /uL   CD4% <10 (L) 30.0 - 60.0 %   CD4 absolute 2 (L) 500 - 1,900 /uL   CD8tox 260 (H) 15.0 - 40.0 %   CD8 T Cell Abs 72 (L) 230 - 1,000 /uL   Ratio 0.003 (L) 1.0 - 3.0    Comment: Performed at [facility] [place] [address] [place], [place] [address]  Glucose, capillary     Status: Abnormal   Collection Time: 05/20/17 11:37 PM  Result Value Ref Range   Glucose-Capillary 147 (H) 65 - 99 mg/dL  CBC     Status: Abnormal   Collection Time: 05/21/17  3:23 AM  Result Value Ref Range   WBC 5.6 4.0 - 10.5 K/uL   RBC 3.77 (L) 4.22 - 5.81 MIL/uL   Hemoglobin 11.7 (L) 13.0 - 17.0 g/dL   HCT 34.4 (L) 39.0 - 52.0 %   MCV 91.2 78.0 - 100.0 fL   MCH 31.0 26.0 - 34.0 pg   MCHC 34.0 30.0 - 36.0 g/dL   RDW 13.9 11.5 - 15.5 %   Platelets 279 150 - 400 K/uL  Basic metabolic panel     Status: Abnormal   Collection Time: 05/21/17  3:23 AM  Result Value Ref Range   Sodium 138 135 - 145 mmol/L   Potassium 3.2 (L) 3.5 - 5.1 mmol/L   Chloride 108 101 - 111 mmol/L   CO2 22 22 - 32 mmol/L   Glucose, Bld 103 (H) 65 - 99 mg/dL   BUN 6 6 - 20 mg/dL   Creatinine, Ser 0.72 0.61 - 1.24 mg/dL   Calcium 9.0 8.9 - 10.3 mg/dL   GFR calc non Af Amer >60 >60 mL/min   GFR calc Af Amer >60 >60 mL/min    Comment: (NOTE) The eGFR has been calculated using the CKD EPI equation. This calculation has not been validated in all clinical situations. eGFR's persistently <60 mL/min signify possible Chronic Kidney Disease.    Anion  gap 8 5 - 15  Magnesium     Status: None   Collection Time: 05/21/17  3:23 AM  Result Value Ref Range   Magnesium 1.9 1.7 - 2.4 mg/dL  Phosphorus     Status: None   Collection Time: 05/21/17  3:23 AM  Result Value Ref Range   Phosphorus 3.6 2.5 - 4.6 mg/dL   Glucose, capillary     Status: None   Collection Time: 05/21/17  3:27 AM  Result Value Ref Range   Glucose-Capillary 80 65 - 99 mg/dL  Blood gas, arterial     Status: Abnormal   Collection Time: 05/21/17  4:15 AM  Result Value Ref Range   FIO2 60.00    Delivery systems VENTILATOR    Mode PRESSURE REGULATED VOLUME CONTROL    VT 640 mL   LHR 16 resp/min   Peep/cpap 5.0 cm H20   pH, Arterial 7.535 (H) 7.350 - 7.450   pCO2 arterial 26.3 (L) 32.0 - 48.0 mmHg   pO2, Arterial 219 (H) 83.0 - 108.0 mmHg   Bicarbonate 22.1 20.0 - 28.0 mmol/L   Acid-base deficit 0.3 0.0 - 2.0 mmol/L   O2 Saturation 99.5 %   Patient temperature 98.6    Collection site RIGHT RADIAL    Drawn by [phone]    Sample type ARTERIAL DRAW    Allens test (pass/fail) PASS PASS  Glucose, capillary     Status: Abnormal   Collection Time: 05/21/17  8:24 AM  Result Value Ref Range   Glucose-Capillary 128 (H) 65 - 99 mg/dL   Comment 1 Notify RN    Comment 2 Document in Chart   Glucose, capillary     Status: Abnormal   Collection Time: 05/21/17 12:05 PM  Result Value Ref Range   Glucose-Capillary 106 (H) 65 - 99 mg/dL  Culture, respiratory (NON-Expectorated)     Status: None (Preliminary result)   Collection Time: 05/21/17 12:10 PM  Result Value Ref Range   Specimen Description TRACHEAL ASPIRATE    Special Requests NONE    Gram Stain      MODERATE WBC PRESENT,BOTH PMN AND MONONUCLEAR NO ORGANISMS SEEN    Culture CULTURE REINCUBATED FOR BETTER GROWTH    Report Status PENDING   Magnesium     Status: None   Collection Time: 05/21/17 12:49 PM  Result Value Ref Range   Magnesium 1.8 1.7 - 2.4 mg/dL  Phosphorus     Status: None   Collection Time: 05/21/17 12:49 PM  Result Value Ref Range   Phosphorus 3.6 2.5 - 4.6 mg/dL  Glucose, capillary     Status: Abnormal   Collection Time: 05/21/17  4:24 PM  Result Value Ref Range   Glucose-Capillary 135 (H) 65 - 99 mg/dL   Comment 1 Notify RN    Comment 2 Document in  Chart   Magnesium     Status: None   Collection Time: 05/21/17  4:43 PM  Result Value Ref Range   Magnesium 1.7 1.7 - 2.4 mg/dL  Phosphorus     Status: None   Collection Time: 05/21/17  4:43 PM  Result Value Ref Range   Phosphorus 3.0 2.5 - 4.6 mg/dL  Glucose, capillary     Status: Abnormal   Collection Time: 05/21/17  7:56 PM  Result Value Ref Range   Glucose-Capillary 191 (H) 65 - 99 mg/dL  Glucose, capillary     Status: Abnormal   Collection Time: 05/21/17 11:33 PM  Result Value Ref Range   Glucose-Capillary  137 (H) 65 - 99 mg/dL  Blood gas, arterial     Status: Abnormal   Collection Time: 05/22/17  3:50 AM  Result Value Ref Range   FIO2 40.00    Delivery systems VENTILATOR    Mode PRESSURE REGULATED VOLUME CONTROL    VT 640 mL   LHR 16 resp/min   Peep/cpap 5.0 cm H20   pH, Arterial 7.492 (H) 7.350 - 7.450   pCO2 arterial 27.7 (L) 32.0 - 48.0 mmHg   pO2, Arterial 171 (H) 83.0 - 108.0 mmHg   Bicarbonate 21.0 20.0 - 28.0 mmol/L   Acid-base deficit 1.9 0.0 - 2.0 mmol/L   O2 Saturation 99.3 %   Patient temperature 98.6    Collection site LEFT BRACHIAL    Drawn by [phone]    Sample type ARTERIAL DRAW    Allens test (pass/fail) PASS PASS  Glucose, capillary     Status: Abnormal   Collection Time: 05/22/17  4:01 AM  Result Value Ref Range   Glucose-Capillary 140 (H) 65 - 99 mg/dL  CBC     Status: Abnormal   Collection Time: 05/22/17  4:40 AM  Result Value Ref Range   WBC 4.6 4.0 - 10.5 K/uL   RBC 3.44 (L) 4.22 - 5.81 MIL/uL   Hemoglobin 10.0 (L) 13.0 - 17.0 g/dL   HCT 31.1 (L) 39.0 - 52.0 %   MCV 90.4 78.0 - 100.0 fL   MCH 29.1 26.0 - 34.0 pg   MCHC 32.2 30.0 - 36.0 g/dL   RDW 13.8 11.5 - 15.5 %   Platelets 193 150 - 400 K/uL  Basic metabolic panel     Status: Abnormal   Collection Time: 05/22/17  4:40 AM  Result Value Ref Range   Sodium 139 135 - 145 mmol/L   Potassium 3.8 3.5 - 5.1 mmol/L   Chloride 111 101 - 111 mmol/L   CO2 22 22 - 32 mmol/L   Glucose, Bld  133 (H) 65 - 99 mg/dL   BUN 15 6 - 20 mg/dL   Creatinine, Ser 0.80 0.61 - 1.24 mg/dL   Calcium 8.5 (L) 8.9 - 10.3 mg/dL   GFR calc non Af Amer >60 >60 mL/min   GFR calc Af Amer >60 >60 mL/min    Comment: (NOTE) The eGFR has been calculated using the CKD EPI equation. This calculation has not been validated in all clinical situations. eGFR's persistently <60 mL/min signify possible Chronic Kidney Disease.    Anion gap 6 5 - 15  Magnesium     Status: None   Collection Time: 05/22/17  4:40 AM  Result Value Ref Range   Magnesium 1.9 1.7 - 2.4 mg/dL  Phosphorus     Status: None   Collection Time: 05/22/17  4:40 AM  Result Value Ref Range   Phosphorus 3.4 2.5 - 4.6 mg/dL  Glucose, capillary     Status: Abnormal   Collection Time: 05/22/17  8:36 AM  Result Value Ref Range   Glucose-Capillary 166 (H) 65 - 99 mg/dL  Glucose, capillary     Status: Abnormal   Collection Time: 05/22/17 11:51 AM  Result Value Ref Range   Glucose-Capillary 169 (H) 65 - 99 mg/dL   Comment 1 Notify RN    Comment 2 Document in Chart    Ct Head Wo Contrast  Result Date: 05/21/2017 CLINICAL DATA:  Headaches and recent seizure activity EXAM: CT HEAD WITHOUT CONTRAST TECHNIQUE: Contiguous axial images were obtained from the base of the skull through  the vertex without intravenous contrast. COMPARISON:  05/20/2017 FINDINGS: Brain: No findings to suggest acute hemorrhage, acute infarction or space-occupying mass lesion are noted. There is again noted some mild decreased attenuation in the midportion of the right internal capsule stable from the previous exam. No new focal abnormality is noted. Vascular: No hyperdense vessel or unexpected calcification. Skull: Normal. Negative for fracture or focal lesion. Sinuses/Orbits: No acute finding. Other: None. IMPRESSION: Stable changes from the previous day.  No acute abnormality noted. Electronically Signed   By: Inez Catalina M.D.   On: 05/21/2017 09:42   Ct Head W Or Wo  Contrast  Result Date: 05/20/2017 CLINICAL DATA:  New onset seizure.  History of HIV. EXAM: CT HEAD WITHOUT AND WITH CONTRAST TECHNIQUE: Contiguous axial images were obtained from the base of the skull through the vertex without and with intravenous contrast CONTRAST:  75 mL Isovue-300 COMPARISON:  Noncontrast head CT 05/18/2017 FINDINGS: Brain: There is no evidence of acute infarct, intracranial hemorrhage, mass, midline shift, or extra-axial fluid collection. Minimal prominence of the lateral ventricles for age is unchanged. A small amount of subtle hypoattenuation in the right internal capsule is unchanged and nonspecific. No abnormal enhancement is identified. Vascular: Visible vessels are patent. Skull: No fracture or focal osseous lesion. Sinuses/Orbits: No significant inflammatory disease in the imaged portions of the paranasal sinuses and mastoid air cells. Unremarkable orbits. Other: None. IMPRESSION: 1. No acute intracranial abnormality identified. 2. Minimal unchanged hypoattenuation in the right internal capsule, nonspecific. Electronically Signed   By: Logan Bores M.D.   On: 05/20/2017 15:29   Dg Chest Port 1 View  Result Date: 05/22/2017 CLINICAL DATA:  Intubation.  Seizure. EXAM: PORTABLE CHEST 1 VIEW COMPARISON:  05/21/2017 . FINDINGS: Endotracheal tube and NG tube in stable position. Low lung volumes. Mild bibasilar infiltrates. No pleural effusion or pneumothorax. Heart size normal. IMPRESSION: 1. Endotracheal tube and NG tube in stable position. 2.  Low lung volumes.  Mild bibasilar infiltrates. Electronically Signed   By: Marcello Moores  Register   On: 05/22/2017 07:19   Dg Chest Port 1 View  Result Date: 05/21/2017 CLINICAL DATA:  Check endotracheal tube placement EXAM: PORTABLE CHEST 1 VIEW COMPARISON:  05/20/2017 FINDINGS: Cardiac shadow is within normal limits. Endotracheal tube and nasogastric catheter are noted in satisfactory position. The lungs are clear bilaterally. No bony  abnormality is noted. IMPRESSION: Tubes and lines as described.  No acute abnormality noted. Electronically Signed   By: Inez Catalina M.D.   On: 05/21/2017 08:17   Dg Chest Portable 1 View  Result Date: 05/20/2017 CLINICAL DATA:  Evaluate ETT placement EXAM: PORTABLE CHEST 1 VIEW COMPARISON:  May 18, 2017 FINDINGS: The ETT is in good position. The OG tube terminates below today's film. No pneumothorax. The lungs are clear. No other abnormalities. IMPRESSION: Support apparatus placement as above. Electronically Signed   By: Dorise Bullion III M.D   On: 05/20/2017 14:36    Review of Systems  Unable to perform ROS: Intubated    Blood pressure (!) 101/56, pulse 100, temperature 97.3 F (36.3 C), resp. rate 16, height '6\' 1"'  (1.854 m), weight 148 lb 2.4 oz (67.2 kg), SpO2 98 %. Physical Exam  Vitals reviewed. Constitutional: He appears well-developed and well-nourished.  HENT:  Head: Normocephalic and atraumatic.  Left pupil blown, unresponsive  Eyes: Conjunctivae are normal. Right eye exhibits no discharge. Left eye exhibits no discharge.  Neck: Neck supple. No tracheal deviation present. Thyromegaly present.  Cardiovascular: Normal rate, regular rhythm  and normal heart sounds.   Respiratory: Breath sounds normal.  GI: Soft. Bowel sounds are normal. He exhibits no distension. There is no tenderness. There is no rebound.  Musculoskeletal: He exhibits no edema or tenderness.  Neurological:  Intubated sedated. See neuro note for details.   Skin: Skin is dry.    Note IVC in place. Sedated on diprivan. Assessment/Plan  Admitted to icu Lung protective ventilation, adjust vent for ABG Abx per ID service, appreciate input, crypto in CSF and strep in blood, rocephin, vanc and bactrim Sepsis bundle LP noted as above Neurosurgery placed ivc a drain for increased ICP GI and DVT Px CT head negative for acute findings CXR clear Hypokalemia - replace as needed Begin TF No family bedside.      Critical Care Time : 30 min Richardson Landry Minor ACNP Maryanna Shape PCCM Pager [phone] till 3 pm If no answer page [phone] 05/22/2017, 12:38 PM    STAFF NOTE: I, Merrie Roof, MD FACP have personally reviewed patient's available data, including medical history, events of note, physical examination and test results as part of my evaluation. I have discussed with resident/NP and other care providers such as pharmacist, RN and RRT. In addition, I personally evaluated patient and elicited key findings of: rass-2, not fc, moves rt ext, CN intact especially eyes, lungs coarse, reduced, abdo soft, bs wnl, no r/g, edema mild to none, small muscle mass, pcxr c/w edema int edema which I reviewed, clinical course c/w, crypto  Meningitis with elevated icp s/p drain placement, arf on vrdf, not convinced of ali, this looks like edema, but has strp PNA bacteremia, no oslers triad present as no strep in csf, ABX per ID, ABg reviewed lower T v by 1 cc./kg, rate also may need to be reduced in future, prop with WUA, drain management per ID, feeding pt, assess BM, may consider SBT soon, family in room, updated them in full, mom and grandma, pcxr in am , kee pplat less 30 overall as able, also aocnerned about decadron use with crypto, can we dc this?, also will add laisix to neg baalnce ans foolow up pocxr The patient is critically ill with multiple organ systems failure and requires high complexity decision making for assessment and support, frequent evaluation and titration of therapies, application of advanced monitoring technologies and extensive interpretation of multiple databases.   Critical Care Time devoted to patient care services described in this note is 30 Minutes. This time reflects time of care of this signee: Merrie Roof, MD FACP. This critical care time does not reflect procedure time, or teaching time or supervisory time of PA/NP/Med student/Med Resident etc but could involve care discussion time. Rest  per NP/medical resident whose note is outlined above and that I agree with   Lavon Paganini. Titus Mould, MD, [place] Pgr: [facility] 05/22/2017 1:21 PM

## 2017-05-22 NOTE — Progress Notes (Signed)
[facility]    Date of Admission:  05/20/2017   Total days of antibiotics 3        Day 3 L-ampho/flucytosine        Day 3 Ceftriaxone        Day 3 Bactrim   ID: Justin Ball is a 26 y.o. man with advanced HIV disease recently on a new regimen x 4 weeks admitted for seizure, unresponsiveness, and 5 day hx of headache/nausea/vomiting prior to admit. 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 He had an emergent right frontoventriculostomy by neurosurgery yesterday after showing evidence of excess ICP with decortication and fixed right pupil.   Subjective: Sedated and intubated on vent. Pupils equal and reactive. Family at bedside.   Medications:  . acetaminophen  650 mg Oral Q24H  . chlorhexidine gluconate (MEDLINE KIT)  15 mL Mouth Rinse BID  . dexamethasone  4 mg Intravenous Q4H  . diphenhydrAMINE  25 mg Oral Q24H  . feeding supplement (PRO-STAT SUGAR FREE 64)  30 mL Per Tube BID  . flucytosine  1,500 mg Per Tube Q6H  . insulin aspart  2-6 Units Subcutaneous Q4H  . mouth rinse  15 mL Mouth Rinse 10 times per day  . sulfamethoxazole-trimethoprim  10 mL Per Tube Daily    Objective: Vital signs in last 24 hours: Temp:  [96.3 F (35.7 C)-99.6 F (37.6 C)] 97.3 F (36.3 C) (05/29 1100) Pulse Rate:  [83-131] 104 (05/29 1100) Resp:  [16-38] 16 (05/29 1100) BP: (90-133)/(55-112) 101/55 (05/29 1100) SpO2:  [98 %-100 %] 98 % (05/29 1100) FiO2 (%):  [40 %] 40 % (05/29 0743) Weight:  [148 lb 2.4 oz (67.2 kg)] 148 lb 2.4 oz (67.2 kg) (05/29 0354)   General: Young man intubated and sedated on vent, NAD HEENT: Right frontal ventriculostomy in place, ETT in place CV: RRR, no m/g/r Pulm: CTA bilaterally from anterior Ext: no peripheral edema  Neuro: sedated   Lab Results  Recent Labs  05/21/17 0323  05/22/17 0440  WBC 5.6 4.6  HGB 11.7* 10.0*  HCT 34.4* 31.1*  NA 138 139  K 3.2* 3.8  CL 108 111  CO2 22 22  BUN 6 15  CREATININE 0.72 0.80   Liver Panel  Recent Labs  05/20/17 1308 05/20/17 1900  PROT 9.0* 8.1  ALBUMIN 3.0* 2.6*  AST 59* 44*  ALT <5* 21  ALKPHOS 140* 128*  BILITOT 0.8 0.7   Microbiology:  Studies/Results: Ct Head Wo Contrast  Result Date: 05/21/2017 CLINICAL DATA:  Headaches and recent seizure activity EXAM: CT HEAD WITHOUT CONTRAST TECHNIQUE: Contiguous axial images were obtained from the base of the skull through the vertex without intravenous contrast. COMPARISON:  05/20/2017 FINDINGS: Brain: No findings to suggest acute hemorrhage, acute infarction or space-occupying mass lesion are noted. There is again noted some mild decreased attenuation in the midportion of the right internal capsule stable from the previous exam. No new focal abnormality is noted. Vascular: No hyperdense vessel or unexpected calcification. Skull: Normal. Negative for fracture or focal lesion. Sinuses/Orbits: No acute finding. Other: None. IMPRESSION: Stable changes from the previous day.  No acute abnormality noted. Electronically Signed   By: Inez Catalina M.D.   On: 05/21/2017 09:42   Ct Head W Or Wo Contrast  Result Date: 05/20/2017 CLINICAL DATA:  New onset seizure.  History of  HIV. EXAM: CT HEAD WITHOUT AND WITH CONTRAST TECHNIQUE: Contiguous axial images were obtained from the base of the skull through the vertex without and with intravenous contrast CONTRAST:  75 mL Isovue-300 COMPARISON:  Noncontrast head CT 05/18/2017 FINDINGS: Brain: There is no evidence of acute infarct, intracranial hemorrhage, mass, midline shift, or extra-axial fluid collection. Minimal prominence of the lateral ventricles for age is unchanged. A small amount of subtle hypoattenuation in the right internal capsule is unchanged and nonspecific. No abnormal enhancement is identified. Vascular: Visible vessels  are patent. Skull: No fracture or focal osseous lesion. Sinuses/Orbits: No significant inflammatory disease in the imaged portions of the paranasal sinuses and mastoid air cells. Unremarkable orbits. Other: None. IMPRESSION: 1. No acute intracranial abnormality identified. 2. Minimal unchanged hypoattenuation in the right internal capsule, nonspecific. Electronically Signed   By: Logan Bores M.D.   On: 05/20/2017 15:29   Dg Chest Port 1 View  Result Date: 05/22/2017 CLINICAL DATA:  Intubation.  Seizure. EXAM: PORTABLE CHEST 1 VIEW COMPARISON:  05/21/2017 . FINDINGS: Endotracheal tube and NG tube in stable position. Low lung volumes. Mild bibasilar infiltrates. No pleural effusion or pneumothorax. Heart size normal. IMPRESSION: 1. Endotracheal tube and NG tube in stable position. 2.  Low lung volumes.  Mild bibasilar infiltrates. Electronically Signed   By: Marcello Moores  Register   On: 05/22/2017 07:19   Dg Chest Port 1 View  Result Date: 05/21/2017 CLINICAL DATA:  Check endotracheal tube placement EXAM: PORTABLE CHEST 1 VIEW COMPARISON:  05/20/2017 FINDINGS: Cardiac shadow is within normal limits. Endotracheal tube and nasogastric catheter are noted in satisfactory position. The lungs are clear bilaterally. No bony abnormality is noted. IMPRESSION: Tubes and lines as described.  No acute abnormality noted. Electronically Signed   By: Inez Catalina M.D.   On: 05/21/2017 08:17   Dg Chest Portable 1 View  Result Date: 05/20/2017 CLINICAL DATA:  Evaluate ETT placement EXAM: PORTABLE CHEST 1 VIEW COMPARISON:  May 18, 2017 FINDINGS: The ETT is in good position. The OG tube terminates below today's film. No pneumothorax. The lungs are clear. No other abnormalities. IMPRESSION: Support apparatus placement as above. 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 HIV disease/AIDS - ART held due to CM IRIS - CD4 count 2>> consistent with IRIS - VL pending   3. Increased ICP - s/p emergent right frontal ventriculostomy, POD 1 - Clear drainage, ~225 cc over last 24 hours - Appreciate Neurosurgery and Neurology's management  4. Seizure - Continue Keppra 1000 mg Q12H  5. Fixed pupil- resolved since ventriculostomy  - Continue to monitor for neuro symptoms   6. Strep pneumoniae bacteremia - Continue Ceftriaxone  - Repeat blood cx today, will order  7. OI Prophylaxis - Continue Bactrim - Start qweek Azithro later this week  8. Chronic hepatitis B - Check LFTs in AM - Hep B VL pending   Attending note to follow.  Albin Felling, MD Internal Medicine Resident, PGY-3  05/22/2017, 11:02 AM

## 2017-05-22 NOTE — Progress Notes (Signed)
EVD drain adjusted to 10 mm Hg and re-leveled to patient. Draining clear CSF. Avina Eberle, [place] ScrapeSarah E, RN

## 2017-05-22 NOTE — Progress Notes (Signed)
No issues overnight. EVD in place, functioning well. Draining clear CSF, ~225cc x 24 hrs.  - Cont EVD drainage at 

## 2017-05-22 NOTE — Progress Notes (Signed)
eLink Physician-Brief Progress Note Patient Name: Justin SpurrLinston R Vajda DOB: 01/03/1991 MRN: [id number]   Date of Service  05/22/2017  HPI/Events of Note  Hypothermia  eICU Interventions  Warming blanket     Intervention Category Evaluation Type: Other  Janaya Broy 05/22/2017, 4:52 AM

## 2017-05-23 ENCOUNTER — Inpatient Hospital Stay (HOSPITAL_COMMUNITY): Payer: No Typology Code available for payment source

## 2017-05-23 ENCOUNTER — Encounter (HOSPITAL_COMMUNITY): Payer: Self-pay

## 2017-05-23 DIAGNOSIS — J9600 Acute respiratory failure, unspecified whether with hypoxia or hypercapnia: Secondary | ICD-10-CM | POA: Insufficient documentation

## 2017-05-23 DIAGNOSIS — Z8661 Personal history of infections of the central nervous system: Secondary | ICD-10-CM | POA: Diagnosis present

## 2017-05-23 DIAGNOSIS — Z8619 Personal history of other infectious and parasitic diseases: Secondary | ICD-10-CM | POA: Diagnosis present

## 2017-05-23 DIAGNOSIS — G0490 Encephalitis and encephalomyelitis, unspecified: Secondary | ICD-10-CM

## 2017-05-23 LAB — GLUCOSE, CAPILLARY
GLUCOSE-CAPILLARY: 126 mg/dL — AB (ref 65–99)
GLUCOSE-CAPILLARY: 126 mg/dL — AB (ref 65–99)
Glucose-Capillary: 136 mg/dL — ABNORMAL HIGH (ref 65–99)
Glucose-Capillary: 150 mg/dL — ABNORMAL HIGH (ref 65–99)
Glucose-Capillary: 161 mg/dL — ABNORMAL HIGH (ref 65–99)
Glucose-Capillary: 169 mg/dL — ABNORMAL HIGH (ref 65–99)

## 2017-05-23 LAB — BASIC METABOLIC PANEL
Anion gap: 8 (ref 5–15)
BUN: 23 mg/dL — ABNORMAL HIGH (ref 6–20)
CHLORIDE: 112 mmol/L — AB (ref 101–111)
CO2: 22 mmol/L (ref 22–32)
CREATININE: 0.86 mg/dL (ref 0.61–1.24)
Calcium: 8.7 mg/dL — ABNORMAL LOW (ref 8.9–10.3)
GFR calc Af Amer: >60 mL/min (ref >60)
GFR calc non Af Amer: >60 mL/min (ref >60)
Glucose, Bld: 128 mg/dL — ABNORMAL HIGH (ref 65–99)
Potassium: 3.4 mmol/L — ABNORMAL LOW (ref 3.5–5.1)
SODIUM: 142 mmol/L (ref 135–145)

## 2017-05-23 LAB — HEPATIC FUNCTION PANEL
ALT: 17 U/L (ref 17–63)
AST: 27 U/L (ref 15–41)
Albumin: 2.2 g/dL — ABNORMAL LOW (ref 3.5–5.0)
Alkaline Phosphatase: 126 U/L (ref 38–126)
BILIRUBIN DIRECT: 0.3 mg/dL (ref 0.1–0.5)
Indirect Bilirubin: 0.5 mg/dL (ref 0.3–0.9)
Total Bilirubin: 0.8 mg/dL (ref 0.3–1.2)
Total Protein: 7.9 g/dL (ref 6.5–8.1)

## 2017-05-23 LAB — CBC
HCT: 31.7 % — ABNORMAL LOW (ref 39.0–52.0)
HEMOGLOBIN: 10.4 g/dL — AB (ref 13.0–17.0)
MCH: 29.6 pg (ref 26.0–34.0)
MCHC: 32.8 g/dL (ref 30.0–36.0)
MCV: 90.3 fL (ref 78.0–100.0)
PLATELETS: 209 10*3/uL (ref 150–400)
RBC: 3.51 MIL/uL — AB (ref 4.22–5.81)
RDW: 13.7 % (ref 11.5–15.5)
WBC: 5 10*3/uL (ref 4.0–10.5)

## 2017-05-23 LAB — CULTURE, BLOOD (ROUTINE X 2): SPECIAL REQUESTS: ADEQUATE

## 2017-05-23 LAB — MAGNESIUM: Magnesium: 2 mg/dL (ref 1.7–2.4)

## 2017-05-23 LAB — PHOSPHORUS: PHOSPHORUS: 4 mg/dL (ref 2.5–4.6)

## 2017-05-23 LAB — TRIGLYCERIDES: TRIGLYCERIDES: 101 mg/dL (ref <150)

## 2017-05-23 MED ORDER — DEXAMETHASONE SODIUM PHOSPHATE 4 MG/ML IJ SOLN
4.0000 mg | Freq: Three times a day (TID) | INTRAMUSCULAR | Status: AC
  Administered 2017-05-24 – 2017-05-25 (×3): 4 mg via INTRAVENOUS
  Filled 2017-05-23 (×3): qty 1

## 2017-05-23 MED ORDER — POTASSIUM CHLORIDE 20 MEQ/15ML (10%) PO SOLN
40.0000 meq | Freq: Two times a day (BID) | ORAL | Status: DC
  Administered 2017-05-23 – 2017-05-24 (×4): 40 meq via ORAL
  Filled 2017-05-23 (×5): qty 30

## 2017-05-23 MED ORDER — POTASSIUM CHLORIDE 20 MEQ/15ML (10%) PO SOLN
40.0000 meq | Freq: Once | ORAL | Status: DC
  Administered 2017-05-23: 40 meq via ORAL

## 2017-05-23 MED ORDER — DEXAMETHASONE SODIUM PHOSPHATE 4 MG/ML IJ SOLN
4.0000 mg | Freq: Four times a day (QID) | INTRAMUSCULAR | Status: AC
  Administered 2017-05-23 – 2017-05-24 (×4): 4 mg via INTRAVENOUS
  Filled 2017-05-23 (×4): qty 1

## 2017-05-23 MED ORDER — DEXAMETHASONE SODIUM PHOSPHATE 4 MG/ML IJ SOLN
4.0000 mg | Freq: Two times a day (BID) | INTRAMUSCULAR | Status: AC
  Administered 2017-05-25 (×2): 4 mg via INTRAVENOUS
  Filled 2017-05-23 (×2): qty 1

## 2017-05-23 NOTE — Progress Notes (Signed)
No issues overnight.   EXAM:  BP 117/80   Pulse 89   Temp 97.2 F (36.2 C)   Resp 16   Ht 6\' 1"  (1.854 m)   Wt 70 kg (154 lb 5.2 oz)   SpO2 100%   BMI 20.36 kg/m   Opens eyes spontaneously Not tracking Pupils reactive Not FC, occassional non-purposeful movements EVD in place, clear CSF draining.  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

## 2017-05-23 NOTE — Progress Notes (Signed)
PULMONARY / CRITICAL CARE MEDICINE   Name: Justin Ball MRN: [id number] DOB: 08/29/1991    ADMISSION DATE:  05/20/2017  CHIEF COMPLAINT:  Seizures, altered MS.   HISTORY OF PRESENT ILLNESS:   Patient has been having headache for past few days. He was seen in ER 2 days ago and discharged. Today he had witnessed seizures by family and repeat in ER. When brought in to ER, he was post ictal but did follow some commands. Then he had repeat seizures and was intubated. He had disconjugate eyes which resolved.   He has also been having fevers lately. Grandmother expressed concern of his compliance with HIV meds.   26 year old male with history ofHIV AIDS with history of noncompliance here with altered mental status. History is limited as patient was pulled out of his car unresponsive. Family did report that he thought the patient had a seizure. On arrival, patient is postictal and unable to provide history.  Per review of records, patient was seen 3 days ago in the ER for headache. He was afebrile and otherwise very well-appearing at that time. He had a CT scan which was negative and sent home with supportive care.   PAST MEDICAL HISTORY :  He  has a past medical history of Anxiety; Bipolar 1 disorder (HCC); Depression; HIV infection (HCC); Neuromuscular disorder (HCC); Scabies; Soft palate ulceration (09/12/2016); Substance abuse; and Syphilis.  PAST SURGICAL HISTORY: He  has a past surgical history that includes Incision and drainage perirectal abscess (08/27/2010); Incision and drainage perirectal abscess (N/A, 12/28/2014); Examination under anesthesia (N/A, 12/28/2014); Incision and drainage abscess (N/A, 09/16/2016); and Floor of mouth biopsy (N/A, 09/16/2016).  No Known Allergies  No current facility-administered medications on file prior to encounter.    Current Outpatient Prescriptions on File Prior to Encounter  Medication Sig  . dolutegravir (TIVICAY) 50 MG tablet Take 1 tablet (50 mg  total) by mouth daily.  Marland Kitchen emtricitabine-rilpivir-tenofovir AF (ODEFSEY) 200-25-25 MG TABS tablet Take 1 tablet by mouth daily with breakfast.  . naproxen sodium (ANAPROX) 220 MG tablet Take 220 mg by mouth 2 (two) times daily as needed (pain).  Marland Kitchen sulfamethoxazole-trimethoprim (BACTRIM) 400-80 MG tablet Take 1 tablet by mouth daily.    FAMILY HISTORY:  His indicated that his mother is alive. He indicated that his father is alive. He indicated that the status of his other is unknown.    SOCIAL HISTORY: He  reports that he has been smoking Cigarettes.  He has a 1.25 pack-year smoking history. He has never used smokeless tobacco. He reports that he drinks alcohol. He reports that he does not use drugs.  REVIEW OF SYSTEMS:   Unable to obtain  SUBJECTIVE / Interval events:  Sedated, has opened eyes, has not followed commands for RN. remains on low dose propofol   VITAL SIGNS: BP 124/75   Pulse 95   Temp 98.2 F (36.8 C)   Resp 16   Ht 6\' 1"  (1.854 m)   Wt 70 kg (154 lb 5.2 oz)   SpO2 97%   BMI 20.36 kg/m   HEMODYNAMICS:    VENTILATOR SETTINGS: Vent Mode: PRVC FiO2 (%):  [40 %] 40 % Set Rate:  [16 bmp] 16 bmp Vt Set:  [560 mL-640 mL] 560 mL PEEP:  [5 cmH20] 5 cmH20 Plateau Pressure:  [18 cmH20-26 cmH20] 21 cmH20  INTAKE / OUTPUT: I/O last 3 completed shifts: In: 7480.1 [I.V.:2242.6; NG/GT:2817.5; IV Piggyback:2420] Out: 6641 [Urine:6250; Drains:391]  PHYSICAL EXAMINATION: General:  Ill appearing man,  intubated Neuro:  Opens eyes spont, may have tracked, blinks to threat, does not follow commands. Strong cough w suctioning.  HEENT:  ETT in place, clear oral secretions, B pupils sluggish Cardiovascular:  Regular, no M Lungs:  Coarse BS bilaterally Abdomen:  Soft, benign, + BS Musculoskeletal:  No deformities Skin:  No rash  LABS:  BMET  Recent Labs Lab 05/21/17 0323 05/22/17 0440 05/23/17 0207  NA 138 139 142  K 3.2* 3.8 3.4*  CL 108 111 112*  CO2 22 22 22    BUN 6 15 23*  CREATININE 0.72 0.80 0.86  GLUCOSE 103* 133* 128*    Electrolytes  Recent Labs Lab 05/21/17 0323  05/22/17 0440 05/22/17 1806 05/23/17 0207  CALCIUM 9.0  --  8.5*  --  8.7*  MG 1.9  < > 1.9 1.9 2.0  PHOS 3.6  < > 3.4 3.3 4.0  < > = values in this interval not displayed.  CBC  Recent Labs Lab 05/21/17 0323 05/22/17 0440 05/23/17 0207  WBC 5.6 4.6 5.0  HGB 11.7* 10.0* 10.4*  HCT 34.4* 31.1* 31.7*  PLT 279 193 209    Coag's  Recent Labs Lab 05/20/17 1308 05/20/17 1900  APTT  --  34  INR 1.13 1.20    Sepsis Markers  Recent Labs Lab 05/20/17 1325 05/20/17 1900 05/20/17 2134  LATICACIDVEN 6.64* 2.3* 2.5*  PROCALCITON  --  0.20  --     ABG  Recent Labs Lab 05/20/17 1704 05/21/17 0415 05/22/17 0350  PHART 7.561* 7.535* 7.492*  PCO2ART 26.3* 26.3* 27.7*  PO2ART 96.0 219* 171*    Liver Enzymes  Recent Labs Lab 05/20/17 1308 05/20/17 1900 05/23/17 0207  AST 59* 44* 27  ALT <5* 21 17  ALKPHOS 140* 128* 126  BILITOT 0.8 0.7 0.8  ALBUMIN 3.0* 2.6* 2.2*    Cardiac Enzymes  Recent Labs Lab 05/20/17 1900  TROPONINI <0.03    Glucose  Recent Labs Lab 05/22/17 1608 05/22/17 2010 05/22/17 2326 05/23/17 0328 05/23/17 0845 05/23/17 1151  GLUCAP 155* 183* 138* 126* 136* 126*    Imaging Dg Chest Port 1 View  Result Date: 05/23/2017 CLINICAL DATA:  Intubated patient, HIV, seizure activity current smoker. EXAM: PORTABLE CHEST 1 VIEW COMPARISON:  Chest x-ray of May 22, 2017 FINDINGS: The lungs are adequately inflated. The lung markings remain coarse at the right lung base. The retrocardiac region on the left remains dense medially. The heart and pulmonary vascularity are normal. The endotracheal tube tip lies 3.5 cm above the carina. The esophagogastric tube tip in proximal port project below the inferior margin of the image. IMPRESSION: Persistent bibasilar atelectasis or pneumonia. No pleural effusion or CHF. The support  tubes are in reasonable position. Electronically Signed   By: David  SwazilandJordan M.D.   On: 05/23/2017 07:30     STUDIES:  Head Ct 5/25 >> no acute abnormality Head Ct 5/27 >> no acute abnormality, unchanged minimal R internal capsule hypodensity, unclear significance  Head CT 5/28 >> no new changes c/w 5/27 or 5/25  CULTURES: Blood 5/27 >> S pneumo >> R to erythro, PCN CSF 5/27 >> cryptococcus Blood fungal 5/27 >>  Tracheal aspirate 5/27 >>   ANTIBIOTICS: Amphotericin 5/27 >>  Flucytosine 5/27 >>  Ceftriaxone 5/27 >>  Bactrim 5/27 >>   SIGNIFICANT EVENTS: Urgent R frontoventriculostomy 5/28 following acute decompensation, fixed R pupil  LINES/TUBES: ETT 5/27 >>  ventric 5/28 >>   DISCUSSION: 26 year old man with HIV/AIDS, chronic hep B, recent headaches,  admitted after he was found seizing. Intubated for protection. 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 He also has a pneumococcal bacteremia. He excused acute decompensation on 5/28 with decorticate posturing and asymmetric pupils. Underwent emergent ventriculostomy for depressurization with improvement. Treated initially for IRIS with steroids, now being tapered off in absence of supporting findings. MRI brain pending  ASSESSMENT / PLAN:  PULMONARY A: Acute respiratory failure due to altered mental status, failure to protect airway P:   Okay for pressure support ventilation but no plans for extubation until mental status is improved Continue PRVC 8cc/kg as maintenance vent mode  CARDIOVASCULAR A:  No acute issues P:  Telemetry monitoring  RENAL A:   Hypokalemia P:   Replete electrolytes as indicated Follow BMP  GASTROINTESTINAL A:   Chronic Hepatitis B SUP P:   PPI Follow hepatitis B viral load level  HEMATOLOGIC A:   No acute issues P:  Follow CBC DVT prophylaxis  INFECTIOUS A:   HIV/AIDS Possible immune reconstitution syndrome, doubt at this point given elevated viral load and  leukopenia Cryptococcal meningitis Pneumococcal bacteremia Chronic hepatitis B P:   Continue current amphotericin, flucytosine, ceftriaxone Prophylactic Bactrim; plan to start prophylactic azithromycin later this week Echocardiogram if repeat blood cultures positive Given low suspicion now for immune reconstitution syndrome and the potential deleterious effects of steroids in the setting of cryptococcal meningitis plan to taper steroids to off Anti-retroviral medications remain on hold, restart when recommended by infectious diseases  ENDOCRINE A:   No acute issues   P:   Follow CBG  NEUROLOGIC A:   Acute encephalopathy Cryptococcal meningitis Seizures Fixed pupil, resolved after ventric drain placed P:   RASS goal: -1 Propofol for sedation Keppra as ordered Planning for MRI brain to assess for any inflammatory or structural injury in the setting of meningitis Ventriculostomy management as per neurosurgery recommendations   FAMILY  - Updates: Updated grandmother at bedside 5/30  - Inter-disciplinary family meet or Palliative Care meeting due by:  05/26/17   Independent CC time 40 minutes   Levy Pupa, MD, PhD 05/23/2017, 1:01 PM [facility] [phone] or if no answer [phone]

## 2017-05-23 NOTE — Progress Notes (Signed)
Pharmacy Consult Note:  Electrolyte Replacement   26 yom currently on treatment for cryptococcal meningitis and strep pneumo bacteremia. Pharmacy consulted by ID to manage electrolyte replacement. SCr stable wnl. Lasix added 5/29 per CCM.  K down to 3.4 (receiving K+ in IVF) Mag up to 2   Plan: Start KCl 40mEq PT BID Monitor K and Mag closely and order additional replacement prn   Babs BertinHaley Leiana Rund, PharmD, BCPS Clinical Pharmacist 05/23/2017 9:56 AM

## 2017-05-23 NOTE — Progress Notes (Signed)
[facility]    Date of Admission:  05/20/2017   Total days of antibiotics 4        Day 4 L-ampho/flucytosine        Day 4 Ceftriaxone        Day 4 Bactrim   ID: CLEMENT DENEAULT is a 26 y.o. man with advanced HIV disease recently on a new regimen x 4 weeks admitted for seizure, unresponsiveness, and 5 day hx of headache/nausea/vomiting prior to admit. 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 He had an emergent right frontoventriculostomy by neurosurgery on 5/28 after showing evidence of excess ICP with decortication and fixed right pupil.   Subjective: Sedated and intubated on vent. Opens eyes to voice. Not following any commands.   Medications:  . acetaminophen  650 mg Oral Q24H  . chlorhexidine gluconate (MEDLINE KIT)  15 mL Mouth Rinse BID  . dexamethasone  4 mg Intravenous Q6H   Followed by  . [START ON 05/24/2017] dexamethasone  4 mg Intravenous Q8H   Followed by  . [START ON 05/25/2017] dexamethasone  4 mg Intravenous Q12H  . diphenhydrAMINE  25 mg Oral Q24H  . feeding supplement (PRO-STAT SUGAR FREE 64)  30 mL Per Tube BID  . flucytosine  1,500 mg Per Tube Q6H  . furosemide  40 mg Intravenous Q12H  . insulin aspart  2-6 Units Subcutaneous Q4H  . mouth rinse  15 mL Mouth Rinse 10 times per day  . potassium chloride  40 mEq Oral BID  . sulfamethoxazole-trimethoprim  10 mL Per Tube Daily    Objective: Vital signs in last 24 hours: Temp:  [96.6 F (35.9 C)-98.6 F (37 C)] 97.2 F (36.2 C) (05/30 0900) Pulse Rate:  [81-104] 89 (05/30 0900) Resp:  [16-21] 16 (05/30 0900) BP: (99-128)/(55-88) 117/80 (05/30 0900) SpO2:  [97 %-100 %] 100 % (05/30 0900) FiO2 (%):  [40 %] 40 % (05/30 0734) Weight:  [154 lb 5.2 oz (70 kg)] 154 lb 5.2 oz (70 kg) (05/30 0500)  General: Young man intubated and sedated  on vent, NAD HEENT: Right frontal ventriculostomy in place, draining clear fluid. ETT in place. Pupils reactive bilaterally.  CV: RRR, no m/g/r Pulm: CTA bilaterally from anterior Ext: no peripheral edema Neuro: sedated, opened eyes to voice  Lab Results  Recent Labs  05/22/17 0440 05/23/17 0207  WBC 4.6 5.0  HGB 10.0* 10.4*  HCT 31.1* 31.7*  NA 139 142  K 3.8 3.4*  CL 111 112*  CO2 22 22  BUN 15 23*  CREATININE 0.80 0.86   Liver Panel  Recent Labs  05/20/17 1308 05/20/17 1900  PROT 9.0* 8.1  ALBUMIN 3.0* 2.6*  AST 59* 44*  ALT <5* 21  ALKPHOS 140* 128*  BILITOT 0.8 0.7   Microbiology:  Studies/Results: Dg Chest Port 1 View  Result Date: 05/23/2017 CLINICAL DATA:  Intubated patient, HIV, seizure activity current smoker. EXAM: PORTABLE CHEST 1 VIEW COMPARISON:  Chest x-ray of May 22, 2017 FINDINGS: The lungs are adequately inflated. The lung markings remain coarse at the right lung base. The retrocardiac region on the left remains dense medially. The heart and pulmonary vascularity are normal. The endotracheal tube tip lies 3.5 cm above the carina. The esophagogastric tube tip in proximal port project below the inferior margin of the image. IMPRESSION: Persistent bibasilar atelectasis or pneumonia. No  pleural effusion or CHF. The support tubes are in reasonable position. Electronically Signed   By: David  Martinique M.D.   On: 05/23/2017 07:30   Dg Chest Port 1 View  Result Date: 05/22/2017 CLINICAL DATA:  Intubation.  Seizure. EXAM: PORTABLE CHEST 1 VIEW COMPARISON:  05/21/2017 . FINDINGS: Endotracheal tube and NG tube in stable position. Low lung volumes. Mild bibasilar infiltrates. No pleural effusion or pneumothorax. Heart size normal. IMPRESSION: 1. Endotracheal tube and NG tube in stable position. 2.  Low lung volumes.  Mild bibasilar infiltrates. 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 Developed significant increased ICP with decortication requiring emergent EVD on 5/28.   1. Cryptococcal Meningitis - Continue L-ampho plus flucytosine - Taper dexamethasone, changed to 4 mg Q6H today, then Q8H, then Q12H, then off. Steroids helpful in IRIS but his HIV labs are not consistent with IRIS.  - Agree with getting MRI brain - Continue monitoring electrolytes, CBC, Mg, appreciate pharmacy's help  2. HIV disease/AIDS - Hold ART - CD4 count 2, VL >83,000. This is not consistent with an IRIS picture and furthermore patient had not been taking his ART at home so very unlikely.   3. s/p emergent right frontal ventriculostomy, POD 2 - Clear drainage, ~280 cc over last 24 hours - Appreciate Neurosurgery and Neurology's management  4. Seizure - Continue Keppra 1000 mg Q12H  5. Fixed pupil- resolved. Pupils now reactive bilaterally.   6. Strep pneumoniae bacteremia - Continue Ceftriaxone  - Repeat blood cx from 5/29 pending   7. OI Prophylaxis - Continue Bactrim - Start qweek Azithro later this week  8. Chronic hepatitis B - f/u LFTs - Hep B VL pending   Attending note to follow.  Albin Felling, MD, MPH Internal Medicine Resident, PGY-3  05/23/2017, 10:08 AM

## 2017-05-23 NOTE — Care Management Note (Signed)
Case Management Note  Patient Details  Name: Justin Ball MRN: [id number] Date of Birth: 12/28/1990  Subjective/Objective:   Pt admitted on 05/20/17 with seizures, AMS, cryptococcal meningitis.  PTA, pt independent of ADLS.                    Action/Plan: Will follow for discharge planning as pt progresses.  Pt currently remains intubated.    Expected Discharge Date:                  Expected Discharge Plan:     In-House Referral:  Chaplain  Discharge planning Services  CM Consult  Post Acute Care Choice:    Choice offered to:     DME Arranged:    DME Agency:     HH Arranged:    HH Agency:     Status of Service:  In process, will continue to follow  If discussed at Long Length of Stay Meetings, dates discussed:    Additional Comments:  Quintella BatonJulie W. Aniesha Haughn, RN, BSN  Trauma/Neuro ICU Case Manager [phone]

## 2017-05-23 NOTE — Progress Notes (Signed)
Subjective: Ventriculostomy is draining clear CSF with pressure setting increased from yesterday by Neurosurgery, now at 14 cm water.  Objective: Current vital signs: BP 117/80   Pulse 89   Temp 97.2 F (36.2 C)   Resp 16   Ht '6\' 1"'$  (1.854 m)   Wt 70 kg (154 lb 5.2 oz)   SpO2 100%   BMI 20.36 kg/m  Vital signs in last 24 hours: Temp:  [96.6 F (35.9 C)-98.6 F (37 C)] 97.2 F (36.2 C) (05/30 0900) Pulse Rate:  [81-104] 89 (05/30 0900) Resp:  [16-21] 16 (05/30 0900) BP: (99-128)/(55-88) 117/80 (05/30 0900) SpO2:  [97 %-100 %] 100 % (05/30 0900) FiO2 (%):  [40 %] 40 % (05/30 0734) Weight:  [70 kg (154 lb 5.2 oz)] 70 kg (154 lb 5.2 oz) (05/30 0500)  Intake/Output from previous day: 05/29 0701 - 05/30 0700 In: 4373.5 [I.V.:1483.5; NG/GT:1680; IV Piggyback:1210] Out: 1517 [Urine:5305; Drains:249] Intake/Output this shift: Total I/O In: -  Out: 13 [Drains:13] Nutritional status: Diet NPO time specified  Neurologic Exam: Intubated. Off sedation for 15 minutes prior to neurological exam.  Ment: Eyes open, does not track or fixate on visual stimuli. Does not follow commands or attempt to communicate. Does not localize to pain.  CN: Right pupil 3 mm and reactive, Left pupil 2 mm and reactive. Eyes deviated to right without nystagmus, mild esotropia noted. Face flaccidly symmetric.  Motor/Sensory: Decreased muscle mass bilateral upper extremities. Spontaneously moves LUE with 2/5 strength nonpurposefully. Does not move upper extremities to noxious. Weakly dorsiflexes feet to noxious bilaterally. Tone flaccid x 4.  Reflexes: Hypoactive x 4.   Lab Results: Results for orders placed or performed during the hospital encounter of 05/20/17 (from the past 48 hour(s))  Glucose, capillary     Status: Abnormal   Collection Time: 05/21/17 12:05 PM  Result Value Ref Range   Glucose-Capillary 106 (H) 65 - 99 mg/dL  Culture, respiratory (NON-Expectorated)     Status: None (Preliminary  result)   Collection Time: 05/21/17 12:10 PM  Result Value Ref Range   Specimen Description TRACHEAL ASPIRATE    Special Requests NONE    Gram Stain      MODERATE WBC PRESENT,BOTH PMN AND MONONUCLEAR NO ORGANISMS SEEN    Culture CULTURE REINCUBATED FOR BETTER GROWTH    Report Status PENDING   Magnesium     Status: None   Collection Time: 05/21/17 12:49 PM  Result Value Ref Range   Magnesium 1.8 1.7 - 2.4 mg/dL  Phosphorus     Status: None   Collection Time: 05/21/17 12:49 PM  Result Value Ref Range   Phosphorus 3.6 2.5 - 4.6 mg/dL  Glucose, capillary     Status: Abnormal   Collection Time: 05/21/17  4:24 PM  Result Value Ref Range   Glucose-Capillary 135 (H) 65 - 99 mg/dL   Comment 1 Notify RN    Comment 2 Document in Chart   Magnesium     Status: None   Collection Time: 05/21/17  4:43 PM  Result Value Ref Range   Magnesium 1.7 1.7 - 2.4 mg/dL  Phosphorus     Status: None   Collection Time: 05/21/17  4:43 PM  Result Value Ref Range   Phosphorus 3.0 2.5 - 4.6 mg/dL  Glucose, capillary     Status: Abnormal   Collection Time: 05/21/17  7:56 PM  Result Value Ref Range   Glucose-Capillary 191 (H) 65 - 99 mg/dL  Glucose, capillary     Status:  Abnormal   Collection Time: 05/21/17 11:33 PM  Result Value Ref Range   Glucose-Capillary 137 (H) 65 - 99 mg/dL  Blood gas, arterial     Status: Abnormal   Collection Time: 05/22/17  3:50 AM  Result Value Ref Range   FIO2 40.00    Delivery systems VENTILATOR    Mode PRESSURE REGULATED VOLUME CONTROL    VT 640 mL   LHR 16 resp/min   Peep/cpap 5.0 cm H20   pH, Arterial 7.492 (H) 7.350 - 7.450   pCO2 arterial 27.7 (L) 32.0 - 48.0 mmHg   pO2, Arterial 171 (H) 83.0 - 108.0 mmHg   Bicarbonate 21.0 20.0 - 28.0 mmol/L   Acid-base deficit 1.9 0.0 - 2.0 mmol/L   O2 Saturation 99.3 %   Patient temperature 98.6    Collection site LEFT BRACHIAL    Drawn by [phone]    Sample type ARTERIAL DRAW    Allens test (pass/fail) PASS PASS  Glucose,  capillary     Status: Abnormal   Collection Time: 05/22/17  4:01 AM  Result Value Ref Range   Glucose-Capillary 140 (H) 65 - 99 mg/dL  CBC     Status: Abnormal   Collection Time: 05/22/17  4:40 AM  Result Value Ref Range   WBC 4.6 4.0 - 10.5 K/uL   RBC 3.44 (L) 4.22 - 5.81 MIL/uL   Hemoglobin 10.0 (L) 13.0 - 17.0 g/dL   HCT 31.1 (L) 39.0 - 52.0 %   MCV 90.4 78.0 - 100.0 fL   MCH 29.1 26.0 - 34.0 pg   MCHC 32.2 30.0 - 36.0 g/dL   RDW 13.8 11.5 - 15.5 %   Platelets 193 150 - 400 K/uL  Basic metabolic panel     Status: Abnormal   Collection Time: 05/22/17  4:40 AM  Result Value Ref Range   Sodium 139 135 - 145 mmol/L   Potassium 3.8 3.5 - 5.1 mmol/L   Chloride 111 101 - 111 mmol/L   CO2 22 22 - 32 mmol/L   Glucose, Bld 133 (H) 65 - 99 mg/dL   BUN 15 6 - 20 mg/dL   Creatinine, Ser 0.80 0.61 - 1.24 mg/dL   Calcium 8.5 (L) 8.9 - 10.3 mg/dL   GFR calc non Af Amer >60 >60 mL/min   GFR calc Af Amer >60 >60 mL/min    Comment: (NOTE) The eGFR has been calculated using the CKD EPI equation. This calculation has not been validated in all clinical situations. eGFR's persistently <60 mL/min signify possible Chronic Kidney Disease.    Anion gap 6 5 - 15  Magnesium     Status: None   Collection Time: 05/22/17  4:40 AM  Result Value Ref Range   Magnesium 1.9 1.7 - 2.4 mg/dL  Phosphorus     Status: None   Collection Time: 05/22/17  4:40 AM  Result Value Ref Range   Phosphorus 3.4 2.5 - 4.6 mg/dL  Glucose, capillary     Status: Abnormal   Collection Time: 05/22/17  8:36 AM  Result Value Ref Range   Glucose-Capillary 166 (H) 65 - 99 mg/dL  Glucose, capillary     Status: Abnormal   Collection Time: 05/22/17 11:51 AM  Result Value Ref Range   Glucose-Capillary 169 (H) 65 - 99 mg/dL   Comment 1 Notify RN    Comment 2 Document in Chart   Glucose, capillary     Status: Abnormal   Collection Time: 05/22/17  4:08 PM  Result Value  Ref Range   Glucose-Capillary 155 (H) 65 - 99 mg/dL   Magnesium     Status: None   Collection Time: 05/22/17  6:06 PM  Result Value Ref Range   Magnesium 1.9 1.7 - 2.4 mg/dL  Phosphorus     Status: None   Collection Time: 05/22/17  6:06 PM  Result Value Ref Range   Phosphorus 3.3 2.5 - 4.6 mg/dL  Glucose, capillary     Status: Abnormal   Collection Time: 05/22/17  8:10 PM  Result Value Ref Range   Glucose-Capillary 183 (H) 65 - 99 mg/dL  Glucose, capillary     Status: Abnormal   Collection Time: 05/22/17 11:26 PM  Result Value Ref Range   Glucose-Capillary 138 (H) 65 - 99 mg/dL  Basic metabolic panel     Status: Abnormal   Collection Time: 05/23/17  2:07 AM  Result Value Ref Range   Sodium 142 135 - 145 mmol/L   Potassium 3.4 (L) 3.5 - 5.1 mmol/L   Chloride 112 (H) 101 - 111 mmol/L   CO2 22 22 - 32 mmol/L   Glucose, Bld 128 (H) 65 - 99 mg/dL   BUN 23 (H) 6 - 20 mg/dL   Creatinine, Ser 0.86 0.61 - 1.24 mg/dL   Calcium 8.7 (L) 8.9 - 10.3 mg/dL   GFR calc non Af Amer >60 >60 mL/min   GFR calc Af Amer >60 >60 mL/min    Comment: (NOTE) The eGFR has been calculated using the CKD EPI equation. This calculation has not been validated in all clinical situations. eGFR's persistently <60 mL/min signify possible Chronic Kidney Disease.    Anion gap 8 5 - 15  CBC     Status: Abnormal   Collection Time: 05/23/17  2:07 AM  Result Value Ref Range   WBC 5.0 4.0 - 10.5 K/uL   RBC 3.51 (L) 4.22 - 5.81 MIL/uL   Hemoglobin 10.4 (L) 13.0 - 17.0 g/dL   HCT 31.7 (L) 39.0 - 52.0 %   MCV 90.3 78.0 - 100.0 fL   MCH 29.6 26.0 - 34.0 pg   MCHC 32.8 30.0 - 36.0 g/dL   RDW 13.7 11.5 - 15.5 %   Platelets 209 150 - 400 K/uL  Phosphorus     Status: None   Collection Time: 05/23/17  2:07 AM  Result Value Ref Range   Phosphorus 4.0 2.5 - 4.6 mg/dL  Magnesium     Status: None   Collection Time: 05/23/17  2:07 AM  Result Value Ref Range   Magnesium 2.0 1.7 - 2.4 mg/dL  Glucose, capillary     Status: Abnormal   Collection Time: 05/23/17  3:28 AM   Result Value Ref Range   Glucose-Capillary 126 (H) 65 - 99 mg/dL  Glucose, capillary     Status: Abnormal   Collection Time: 05/23/17  8:45 AM  Result Value Ref Range   Glucose-Capillary 136 (H) 65 - 99 mg/dL    Recent Results (from the past 240 hour(s))  Blood Culture (routine x 2)     Status: Abnormal   Collection Time: 05/20/17 12:55 PM  Result Value Ref Range Status   Specimen Description BLOOD RIGHT ANTECUBITAL  Final   Special Requests   Final    BOTTLES DRAWN AEROBIC AND ANAEROBIC Blood Culture adequate volume   Culture  Setup Time   Final    IN BOTH AEROBIC AND ANAEROBIC BOTTLES GRAM POSITIVE COCCI IN CHAINS CRITICAL RESULT CALLED TO, READ BACK BY AND VERIFIED WITH:  TO GABBOTT(PHARMd) BY [facility] 05/21/2017 AT 5:06AM    Culture STREPTOCOCCUS PNEUMONIAE (A)  Final   Report Status 05/23/2017 FINAL  Final   Organism ID, Bacteria STREPTOCOCCUS PNEUMONIAE  Final      Susceptibility   Streptococcus pneumoniae - MIC*    ERYTHROMYCIN 4 RESISTANT Resistant     LEVOFLOXACIN 0.5 SENSITIVE Sensitive     PENICILLIN (meningitis) 0.25 RESISTANT Resistant     PENICILLIN (non-meningitis) 0.25 SENSITIVE Sensitive     CEFTRIAXONE (non-meningitis) <=0.12 SENSITIVE Sensitive     CEFTRIAXONE (meningitis) <=0.12 SENSITIVE Sensitive     * STREPTOCOCCUS PNEUMONIAE  Fungus culture, blood     Status: None (Preliminary result)   Collection Time: 05/20/17 12:55 PM  Result Value Ref Range Status   Specimen Description BLOOD RIGHT ANTECUBITAL  Final   Special Requests   Final    BOTTLES DRAWN AEROBIC AND ANAEROBIC Blood Culture adequate volume   Culture NO GROWTH 2 DAYS  Final   Report Status PENDING  Incomplete  Blood Culture ID Panel (Reflexed)     Status: Abnormal   Collection Time: 05/20/17 12:55 PM  Result Value Ref Range Status   Enterococcus species NOT DETECTED NOT DETECTED Final   Listeria monocytogenes NOT DETECTED NOT DETECTED Final   Staphylococcus species NOT DETECTED NOT  DETECTED Final   Staphylococcus aureus NOT DETECTED NOT DETECTED Final   Streptococcus species DETECTED (A) NOT DETECTED Final    Comment: CRITICAL RESULT CALLED TO, READ BACK BY AND VERIFIED WITH: TO GABBOTT(PHARMd) BY [facility] 05/21/2017 AT 5:06AM    Streptococcus agalactiae NOT DETECTED NOT DETECTED Final   Streptococcus pneumoniae DETECTED (A) NOT DETECTED Final    Comment: CRITICAL RESULT CALLED TO, READ BACK BY AND VERIFIED WITH: TO GABBOTT(PHARMd) BY [facility] 05/21/2017 AT 5:06AM    Streptococcus pyogenes NOT DETECTED NOT DETECTED Final   Acinetobacter baumannii NOT DETECTED NOT DETECTED Final   Enterobacteriaceae species NOT DETECTED NOT DETECTED Final   Enterobacter cloacae complex NOT DETECTED NOT DETECTED Final   Escherichia coli NOT DETECTED NOT DETECTED Final   Klebsiella oxytoca NOT DETECTED NOT DETECTED Final   Klebsiella pneumoniae NOT DETECTED NOT DETECTED Final   Proteus species NOT DETECTED NOT DETECTED Final   Serratia marcescens NOT DETECTED NOT DETECTED Final   Haemophilus influenzae NOT DETECTED NOT DETECTED Final   Neisseria meningitidis NOT DETECTED NOT DETECTED Final   Pseudomonas aeruginosa NOT DETECTED NOT DETECTED Final   Candida albicans NOT DETECTED NOT DETECTED Final   Candida glabrata NOT DETECTED NOT DETECTED Final   Candida krusei NOT DETECTED NOT DETECTED Final   Candida parapsilosis NOT DETECTED NOT DETECTED Final   Candida tropicalis NOT DETECTED NOT DETECTED Final  Blood Culture (routine x 2)     Status: None (Preliminary result)   Collection Time: 05/20/17  1:50 PM  Result Value Ref Range Status   Specimen Description BLOOD LEFT FOREARM  Final   Special Requests   Final    BOTTLES DRAWN AEROBIC AND ANAEROBIC Blood Culture results may not be optimal due to an inadequate volume of blood received in culture bottles   Culture NO GROWTH 2 DAYS  Final   Report Status PENDING  Incomplete  CSF culture     Status: None (Preliminary result)    Collection Time: 05/20/17  4:10 PM  Result Value Ref Range Status   Specimen Description CSF TUBE 2  Final   Special Requests Immunocompromised  Final   Gram Stain   Final    WBC PRESENT, PREDOMINANTLY  MONONUCLEAR YEAST CYTOSPIN SMEAR CRITICAL RESULT CALLED TO, READ BACK BY AND VERIFIED WITH: Debby Bud AT 1905 05/20/17 BY L BENFIELD    Culture CULTURE REINCUBATED FOR BETTER GROWTH  Final   Report Status PENDING  Incomplete  MRSA PCR Screening     Status: None   Collection Time: 05/20/17  6:28 PM  Result Value Ref Range Status   MRSA by PCR NEGATIVE NEGATIVE Final    Comment:        The GeneXpert MRSA Assay (FDA approved for NASAL specimens only), is one component of a comprehensive MRSA colonization surveillance program. It is not intended to diagnose MRSA infection nor to guide or monitor treatment for MRSA infections.   Culture, respiratory (NON-Expectorated)     Status: None (Preliminary result)   Collection Time: 05/21/17 12:10 PM  Result Value Ref Range Status   Specimen Description TRACHEAL ASPIRATE  Final   Special Requests NONE  Final   Gram Stain   Final    MODERATE WBC PRESENT,BOTH PMN AND MONONUCLEAR NO ORGANISMS SEEN    Culture CULTURE REINCUBATED FOR BETTER GROWTH  Final   Report Status PENDING  Incomplete    Lipid Panel  Recent Labs  05/20/17 1900  TRIG 51    Studies/Results: Ct Head Wo Contrast  Result Date: 05/21/2017 CLINICAL DATA:  Headaches and recent seizure activity EXAM: CT HEAD WITHOUT CONTRAST TECHNIQUE: Contiguous axial images were obtained from the base of the skull through the vertex without intravenous contrast. COMPARISON:  05/20/2017 FINDINGS: Brain: No findings to suggest acute hemorrhage, acute infarction or space-occupying mass lesion are noted. There is again noted some mild decreased attenuation in the midportion of the right internal capsule stable from the previous exam. No new focal abnormality is noted. Vascular: No  hyperdense vessel or unexpected calcification. Skull: Normal. Negative for fracture or focal lesion. Sinuses/Orbits: No acute finding. Other: None. IMPRESSION: Stable changes from the previous day.  No acute abnormality noted. Electronically Signed   By: Inez Catalina M.D.   On: 05/21/2017 09:42   Dg Chest Port 1 View  Result Date: 05/23/2017 CLINICAL DATA:  Intubated patient, HIV, seizure activity current smoker. EXAM: PORTABLE CHEST 1 VIEW COMPARISON:  Chest x-ray of May 22, 2017 FINDINGS: The lungs are adequately inflated. The lung markings remain coarse at the right lung base. The retrocardiac region on the left remains dense medially. The heart and pulmonary vascularity are normal. The endotracheal tube tip lies 3.5 cm above the carina. The esophagogastric tube tip in proximal port project below the inferior margin of the image. IMPRESSION: Persistent bibasilar atelectasis or pneumonia. No pleural effusion or CHF. The support tubes are in reasonable position. Electronically Signed   By: David  Martinique M.D.   On: 05/23/2017 07:30   Dg Chest Port 1 View  Result Date: 05/22/2017 CLINICAL DATA:  Intubation.  Seizure. EXAM: PORTABLE CHEST 1 VIEW COMPARISON:  05/21/2017 . FINDINGS: Endotracheal tube and NG tube in stable position. Low lung volumes. Mild bibasilar infiltrates. No pleural effusion or pneumothorax. Heart size normal. IMPRESSION: 1. Endotracheal tube and NG tube in stable position. 2.  Low lung volumes.  Mild bibasilar infiltrates. Electronically Signed   By: Marcello Moores  Register   On: 05/22/2017 07:19    Medications:  Scheduled: . acetaminophen  650 mg Oral Q24H  . chlorhexidine gluconate (MEDLINE KIT)  15 mL Mouth Rinse BID  . dexamethasone  4 mg Intravenous Q4H  . diphenhydrAMINE  25 mg Oral Q24H  . feeding supplement (PRO-STAT SUGAR  FREE 64)  30 mL Per Tube BID  . flucytosine  1,500 mg Per Tube Q6H  . furosemide  40 mg Intravenous Q12H  . insulin aspart  2-6 Units Subcutaneous Q4H  .  mouth rinse  15 mL Mouth Rinse 10 times per day  . potassium chloride  40 mEq Oral BID  . 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  1. CSF from [facility] on 5/27: Glucose 62, 14 WBC (95% lymphocytes), protein 39. Fungal elements noted. On L-amphotericin B and flucytosine. ID following.  2. Though the exact mechanism behind his appearance is unclear, he very much has the appearance of someone with uncal herniation, but his CT did not support this. Given hypodensity of right internal capsule on CT head, there may be more extensive abnormality within the region of the perforating arteries of the thalami and basal ganglia, due to perivascular infiltration by cryptococcal meningitis. An MRI is needed to further assess.  3. EEG with no seizures or epileptiform discharges. 4. Possible IRIS given ART for advanced HIV disease. On steroids. ART held. ID following 5. EVD. Followed by Neurosurgery.  6. Strep pneumo bacteremia. On ceftriaxone.   Recommendations: 1. Continue Keppra 1000 mg IV BID 2. Appreciate neurosurgery assistance. Ventricular drain in place with pressure setting currently at 14 cm water. Clear colorless drainage is noted.  3. Long term EEG discontinued.  4. Continue flucotysine per tube and IV amphotericin B. Also on Decadron 4 mg IV q4h. ID is following.  5. When stable, obtain MRI brain with and without contrast.  6. Wean off sedation, as tolerated.    LOS: 3 days   _0  signed: Dr. Kerney Elbe 05/23/2017  9:18 AM

## 2017-05-24 ENCOUNTER — Inpatient Hospital Stay (HOSPITAL_COMMUNITY): Payer: No Typology Code available for payment source

## 2017-05-24 DIAGNOSIS — A3780 Whooping cough due to other Bordetella species without pneumonia: Secondary | ICD-10-CM

## 2017-05-24 LAB — CBC
HEMATOCRIT: 34.3 % — AB (ref 39.0–52.0)
Hemoglobin: 11.3 g/dL — ABNORMAL LOW (ref 13.0–17.0)
MCH: 30 pg (ref 26.0–34.0)
MCHC: 32.9 g/dL (ref 30.0–36.0)
MCV: 91 fL (ref 78.0–100.0)
Platelets: 273 10*3/uL (ref 150–400)
RBC: 3.77 MIL/uL — AB (ref 4.22–5.81)
RDW: 13.8 % (ref 11.5–15.5)
WBC: 4.9 10*3/uL (ref 4.0–10.5)

## 2017-05-24 LAB — MAGNESIUM: MAGNESIUM: 2 mg/dL (ref 1.7–2.4)

## 2017-05-24 LAB — CSF CULTURE

## 2017-05-24 LAB — BASIC METABOLIC PANEL
ANION GAP: 9 (ref 5–15)
BUN: 24 mg/dL — ABNORMAL HIGH (ref 6–20)
CHLORIDE: 112 mmol/L — AB (ref 101–111)
CO2: 23 mmol/L (ref 22–32)
CREATININE: 0.76 mg/dL (ref 0.61–1.24)
Calcium: 9 mg/dL (ref 8.9–10.3)
GFR calc non Af Amer: >60 mL/min (ref >60)
Glucose, Bld: 116 mg/dL — ABNORMAL HIGH (ref 65–99)
POTASSIUM: 3.8 mmol/L (ref 3.5–5.1)
SODIUM: 144 mmol/L (ref 135–145)

## 2017-05-24 LAB — GLUCOSE, CAPILLARY
GLUCOSE-CAPILLARY: 137 mg/dL — AB (ref 65–99)
GLUCOSE-CAPILLARY: 120 mg/dL — AB (ref 65–99)
GLUCOSE-CAPILLARY: 96 mg/dL (ref 65–99)
GLUCOSE-CAPILLARY: 140 mg/dL — AB (ref 65–99)
Glucose-Capillary: 107 mg/dL — ABNORMAL HIGH (ref 65–99)

## 2017-05-24 LAB — CSF CULTURE W GRAM STAIN

## 2017-05-24 MED ORDER — VITAL AF 1.2 CAL PO LIQD
1000.0000 mL | ORAL | Status: DC
  Administered 2017-05-25 – 2017-05-28 (×4): 1000 mL
  Filled 2017-05-24 (×4): qty 1000

## 2017-05-24 MED ORDER — AZITHROMYCIN 200 MG/5ML PO SUSR
1200.0000 mg | ORAL | Status: DC
Start: 2017-05-31 — End: 2017-06-26
  Administered 2017-05-31 – 2017-06-21 (×4): 1200 mg
  Filled 2017-05-24 (×4): qty 30

## 2017-05-24 MED ORDER — DEXTROSE 5 % IV SOLN
1200.0000 mg | INTRAVENOUS | Status: AC
  Administered 2017-05-24: 1200 mg via INTRAVENOUS
  Filled 2017-05-24: qty 1200

## 2017-05-24 NOTE — Progress Notes (Addendum)
PULMONARY / CRITICAL CARE MEDICINE   Name: Justin Ball MRN: [id number] DOB: 1991/10/03    ADMISSION DATE:  05/20/2017  CHIEF COMPLAINT:  Seizures, altered MS.   HISTORY OF PRESENT ILLNESS:   Patient has been having headache for past few days. He was seen in ER 2 days ago and discharged. Today he had witnessed seizures by family and repeat in ER. When brought in to ER, he was post ictal but did follow some commands. Then he had repeat seizures and was intubated. He had disconjugate eyes which resolved.   He has also been having fevers lately. Grandmother expressed concern of his compliance with HIV meds.   26 year old male with history ofHIV AIDS with history of noncompliance here with altered mental status. History is limited as patient was pulled out of his car unresponsive. Family did report that he thought the patient had a seizure. On arrival, patient is postictal and unable to provide history.  Per review of records, patient was seen 3 days ago in the ER for headache. He was afebrile and otherwise very well-appearing at that time. He had a CT scan which was negative and sent home with supportive care.   PAST MEDICAL HISTORY :  He  has a past medical history of Anxiety; Bipolar 1 disorder (HCC); Depression; HIV infection (HCC); Neuromuscular disorder (HCC); Scabies; Soft palate ulceration (09/12/2016); Substance abuse; and Syphilis.  PAST SURGICAL HISTORY: He  has a past surgical history that includes Incision and drainage perirectal abscess (08/27/2010); Incision and drainage perirectal abscess (N/A, 12/28/2014); Examination under anesthesia (N/A, 12/28/2014); Incision and drainage abscess (N/A, 09/16/2016); and Floor of mouth biopsy (N/A, 09/16/2016).  No Known Allergies  No current facility-administered medications on file prior to encounter.    Current Outpatient Prescriptions on File Prior to Encounter  Medication Sig  . dolutegravir (TIVICAY) 50 MG tablet Take 1 tablet (50 mg  total) by mouth daily.  Marland Kitchen emtricitabine-rilpivir-tenofovir AF (ODEFSEY) 200-25-25 MG TABS tablet Take 1 tablet by mouth daily with breakfast.  . naproxen sodium (ANAPROX) 220 MG tablet Take 220 mg by mouth 2 (two) times daily as needed (pain).  Marland Kitchen sulfamethoxazole-trimethoprim (BACTRIM) 400-80 MG tablet Take 1 tablet by mouth daily.    FAMILY HISTORY:  His indicated that his mother is alive. He indicated that his father is alive. He indicated that the status of his other is unknown.    SOCIAL HISTORY: He  reports that he has been smoking Cigarettes.  He has a 1.25 pack-year smoking history. He has never used smokeless tobacco. He reports that he drinks alcohol. He reports that he does not use drugs.  REVIEW OF SYSTEMS:   Unable to obtain  SUBJECTIVE / Interval events:  Sedated, has opened eyes, has not followed commands for RN. remains on low dose propofol   VITAL SIGNS: BP 119/70   Pulse (!) 106   Temp 99.1 F (37.3 C)   Resp (!) 21   Ht 6\' 1"  (1.854 m)   Wt 64.1 kg (141 lb 5 oz)   SpO2 95%   BMI 18.64 kg/m   HEMODYNAMICS:    VENTILATOR SETTINGS: Vent Mode: CPAP;PSV FiO2 (%):  [40 %] 40 % Set Rate:  [16 bmp] 16 bmp Vt Set:  [560 mL] 560 mL PEEP:  [5 cmH20] 5 cmH20 Plateau Pressure:  [18 cmH20-24 cmH20] 19 cmH20  INTAKE / OUTPUT: I/O last 3 completed shifts: In: 6322.7 [I.V.:2672.7; NG/GT:2520; IV Piggyback:1130] Out: 40981 [Urine:9790; Drains:345; Stool:600]  PHYSICAL EXAMINATION: General:  Ill  appearing man, intubated Neuro:  Opens eyes spont, may have tracked, blinks to threat, does not follow commands. Strong cough w suctioning.  HEENT:  ETT in place, clear oral secretions, B pupils sluggish Cardiovascular:  Regular, no M Lungs:  Coarse BS bilaterally Abdomen:  Soft, benign, + BS Musculoskeletal:  No deformities Skin:  No rash  LABS:  BMET  Recent Labs Lab 05/22/17 0440 05/23/17 0207 05/24/17 0317  NA 139 142 144  K 3.8 3.4* 3.8  CL 111 112* 112*   CO2 22 22 23   BUN 15 23* 24*  CREATININE 0.80 0.86 0.76  GLUCOSE 133* 128* 116*    Electrolytes  Recent Labs Lab 05/22/17 0440 05/22/17 1806 05/23/17 0207 05/24/17 0317  CALCIUM 8.5*  --  8.7* 9.0  MG 1.9 1.9 2.0 2.0  PHOS 3.4 3.3 4.0  --     CBC  Recent Labs Lab 05/22/17 0440 05/23/17 0207 05/24/17 0317  WBC 4.6 5.0 4.9  HGB 10.0* 10.4* 11.3*  HCT 31.1* 31.7* 34.3*  PLT 193 209 273    Coag's  Recent Labs Lab 05/20/17 1308 05/20/17 1900  APTT  --  34  INR 1.13 1.20    Sepsis Markers  Recent Labs Lab 05/20/17 1325 05/20/17 1900 05/20/17 2134  LATICACIDVEN 6.64* 2.3* 2.5*  PROCALCITON  --  0.20  --     ABG  Recent Labs Lab 05/20/17 1704 05/21/17 0415 05/22/17 0350  PHART 7.561* 7.535* 7.492*  PCO2ART 26.3* 26.3* 27.7*  PO2ART 96.0 219* 171*    Liver Enzymes  Recent Labs Lab 05/20/17 1308 05/20/17 1900 05/23/17 0207  AST 59* 44* 27  ALT <5* 21 17  ALKPHOS 140* 128* 126  BILITOT 0.8 0.7 0.8  ALBUMIN 3.0* 2.6* 2.2*    Cardiac Enzymes  Recent Labs Lab 05/20/17 1900  TROPONINI <0.03    Glucose  Recent Labs Lab 05/23/17 1151 05/23/17 1540 05/23/17 1947 05/23/17 2346 05/24/17 0334 05/24/17 0909  GLUCAP 126* 169* 150* 161* 107* 137*    Imaging Dg Chest Port 1 View  Result Date: 05/24/2017 CLINICAL DATA:  Acute respiratory failure, seizure activity, intubated patient. EXAM: PORTABLE CHEST 1 VIEW COMPARISON:  Portable chest x-ray of May 23, 2017 FINDINGS: The lungs are adequately inflated. There is persistent increased density medially in the left lower lung. The right base has cleared. The heart and pulmonary vascularity are normal. The endotracheal tube tip lies 7.4 cm above the carina. The esophagogastric tube tip and proximal port project below the inferior margin of the image. IMPRESSION: Persistent medial left basilar atelectasis or pneumonia, possibly related aspiration. High positioning of the endotracheal tube.  Advancement by 3-4 cm is recommended Electronically Signed   By: David  [place] M.D.   On: 05/24/2017 07:42     STUDIES:  Head Ct 5/25 >> no acute abnormality Head Ct 5/27 >> no acute abnormality, unchanged minimal R internal capsule hypodensity, unclear significance  Head CT 5/28 >> no new changes c/w 5/27 or 5/25  CULTURES: Blood 5/27 >> S pneumo >> R to erythro, PCN CSF 5/27 >> cryptococcus Blood fungal 5/27 >>  Tracheal aspirate 5/27 >> bordatella (R to cefazolin)  ANTIBIOTICS: Amphotericin 5/27 >>  Flucytosine 5/27 >>  Ceftriaxone 5/27 >>  Bactrim 5/27 >>   SIGNIFICANT EVENTS: Urgent R frontoventriculostomy 5/28 following acute decompensation, fixed R pupil  LINES/TUBES: ETT 5/27 >>  ventric 5/28 >>   DISCUSSION: 26 year old man with HIV/AIDS, chronic hep B, recent headaches, admitted after he was found seizing. Intubated for  protection. 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 He also has a pneumococcal bacteremia. He excused acute decompensation on 5/28 with decorticate posturing and asymmetric pupils. Underwent emergent ventriculostomy for depressurization with improvement. Treated initially for IRIS with steroids, now being tapered off in absence of supporting findings. MRI brain pending  ASSESSMENT / PLAN:  PULMONARY A: Acute respiratory failure due to altered mental status, failure to protect airway P:   Continue pressure support as tolerated, does not have the mental status for extubation Continue 8 mL/kg maintenance control mode  CARDIOVASCULAR A:  No acute issues P:  Telemetry monitoring  RENAL A:   Hypokalemia P:   Follow BMP Replace lites lites as indicated  GASTROINTESTINAL A:   Chronic Hepatitis B, high titer greater than 5 billion copies per mL in 02/2017 SUP Diarrhea  P:   PPI Repeat Hep B viral load pending Check stool infectious panel   HEMATOLOGIC A:   No acute issues P:  Follow CBC DVT prophylaxis  INFECTIOUS A:    HIV/AIDS Possible immune reconstitution syndrome, doubt at this point given elevated viral load and leukopenia Cryptococcal meningitis Pneumococcal bacteremia Chronic hepatitis B Bordetella bronchitis/pneumonia P:   Continue current amphotericin, flucytosine Question whether ceftriaxone needs to be changed according to Bordetella sensitivities (not tested). Was sensitive to cefepime, Cipro. I will defer to ID to assist with adjusting if indicated Prophylactic Bactrim. Plan start prophylactic azithromycin later this week Echocardiogram if blood cultures remain positive As above low suspicion for immune reconstitution syndrome. Steroids are being weaned to off given potential deleterious effects in the setting of cryptococcal meningitis. Anti-retroviral medications on hold we will restart when recommended by ID.  ENDOCRINE A:   No acute issues   P:   Follow CBG  NEUROLOGIC A:   Acute encephalopathy Cryptococcal meningitis Seizures Fixed pupil, resolved after ventric drain placed P:   RASS goal: -1 to 0 Wean propofol as able Keppra as ordered Considering MRI brain to assess for any inflammatory or structural injury in the setting of meningitis Ventriculostomy management as per neurosurgery recommendations   FAMILY  - Updates: Updated grandmother at bedside 5/31  - Inter-disciplinary family meet or Palliative Care meeting due by:  05/26/17   Independent CC time 35 minutes   Levy Pupa, MD, PhD 05/24/2017, 10:05 AM [facility] [phone] or if no answer [phone]

## 2017-05-24 NOTE — Progress Notes (Signed)
Subjective: Ventriculostomy is draining clear CSF with pressure setting still at 14 cm water.  Objective: Current vital signs: BP 128/81   Pulse (!) 109   Temp 98.4 F (36.9 C)   Resp (!) 21   Ht '6\' 1"'  (1.854 m)   Wt 64.1 kg (141 lb 5 oz)   SpO2 100%   BMI 18.64 kg/m  Vital signs in last 24 hours: Temp:  [97.2 F (36.2 C)-99.1 F (37.3 C)] 98.4 F (36.9 C) (05/31 0700) Pulse Rate:  [77-115] 109 (05/31 0700) Resp:  [14-23] 21 (05/31 0700) BP: (108-142)/(60-99) 128/81 (05/31 0700) SpO2:  [96 %-100 %] 100 % (05/31 0700) FiO2 (%):  [40 %] 40 % (05/31 0400) Weight:  [64.1 kg (141 lb 5 oz)] 64.1 kg (141 lb 5 oz) (05/31 0500)  Intake/Output from previous day: 05/30 0701 - 05/31 0700 In: 4022.7 [I.V.:1922.7; NG/GT:1680; IV Piggyback:420] Out: 7027 [Urine:6200; Drains:227; Stool:600] Intake/Output this shift: No intake/output data recorded. Nutritional status: Diet NPO time specified  Neurologic Exam: Intubated and sedated with propofol at 35 mcg/kg/min. Propofol held for Neurological examination. Ment: Eyes open, does not track or fixate on visual stimuli. Does not follow commands or attempt to communicate. Moves left upper extremity semipurposefully to noxious stimulus.  CN: Right pupil 3 mm and reactive, Left pupil 2 mm and reactive. Mild esotropia noted. Face flaccidly symmetric.  Motor/Sensory: Decreased muscle mass bilateral upper extremities. Spontaneously moves LUE with 2/5 strength nonpurposefully. Also moves left upper extremity semipurposefully to noxious stimulus. Weakly dorsiflexes feet to noxious bilaterally. Tone flaccid x 4.  Reflexes: Hypoactive x 4  Lab Results: Results for orders placed or performed during the hospital encounter of 05/20/17 (from the past 48 hour(s))  Glucose, capillary     Status: Abnormal   Collection Time: 05/22/17  8:36 AM  Result Value Ref Range   Glucose-Capillary 166 (H) 65 - 99 mg/dL  Culture, blood (Routine X 2) w Reflex to ID Panel      Status: None (Preliminary result)   Collection Time: 05/22/17 11:51 AM  Result Value Ref Range   Specimen Description BLOOD LEFT ARM    Special Requests IN PEDIATRIC BOTTLE Blood Culture adequate volume    Culture NO GROWTH 1 DAY    Report Status PENDING   Glucose, capillary     Status: Abnormal   Collection Time: 05/22/17 11:51 AM  Result Value Ref Range   Glucose-Capillary 169 (H) 65 - 99 mg/dL   Comment 1 Notify RN    Comment 2 Document in Chart   Culture, blood (Routine X 2) w Reflex to ID Panel     Status: None (Preliminary result)   Collection Time: 05/22/17 11:57 AM  Result Value Ref Range   Specimen Description BLOOD LEFT HAND    Special Requests IN PEDIATRIC BOTTLE Blood Culture adequate volume    Culture NO GROWTH 1 DAY    Report Status PENDING   Glucose, capillary     Status: Abnormal   Collection Time: 05/22/17  4:08 PM  Result Value Ref Range   Glucose-Capillary 155 (H) 65 - 99 mg/dL  Magnesium     Status: None   Collection Time: 05/22/17  6:06 PM  Result Value Ref Range   Magnesium 1.9 1.7 - 2.4 mg/dL  Phosphorus     Status: None   Collection Time: 05/22/17  6:06 PM  Result Value Ref Range   Phosphorus 3.3 2.5 - 4.6 mg/dL  Glucose, capillary     Status: Abnormal  Collection Time: 05/22/17  8:10 PM  Result Value Ref Range   Glucose-Capillary 183 (H) 65 - 99 mg/dL  Glucose, capillary     Status: Abnormal   Collection Time: 05/22/17 11:26 PM  Result Value Ref Range   Glucose-Capillary 138 (H) 65 - 99 mg/dL  Basic metabolic panel     Status: Abnormal   Collection Time: 05/23/17  2:07 AM  Result Value Ref Range   Sodium 142 135 - 145 mmol/L   Potassium 3.4 (L) 3.5 - 5.1 mmol/L   Chloride 112 (H) 101 - 111 mmol/L   CO2 22 22 - 32 mmol/L   Glucose, Bld 128 (H) 65 - 99 mg/dL   BUN 23 (H) 6 - 20 mg/dL   Creatinine, Ser 0.86 0.61 - 1.24 mg/dL   Calcium 8.7 (L) 8.9 - 10.3 mg/dL   GFR calc non Af Amer >60 >60 mL/min   GFR calc Af Amer >60 >60 mL/min     Comment: (NOTE) The eGFR has been calculated using the CKD EPI equation. This calculation has not been validated in all clinical situations. eGFR's persistently <60 mL/min signify possible Chronic Kidney Disease.    Anion gap 8 5 - 15  CBC     Status: Abnormal   Collection Time: 05/23/17  2:07 AM  Result Value Ref Range   WBC 5.0 4.0 - 10.5 K/uL   RBC 3.51 (L) 4.22 - 5.81 MIL/uL   Hemoglobin 10.4 (L) 13.0 - 17.0 g/dL   HCT 31.7 (L) 39.0 - 52.0 %   MCV 90.3 78.0 - 100.0 fL   MCH 29.6 26.0 - 34.0 pg   MCHC 32.8 30.0 - 36.0 g/dL   RDW 13.7 11.5 - 15.5 %   Platelets 209 150 - 400 K/uL  Phosphorus     Status: None   Collection Time: 05/23/17  2:07 AM  Result Value Ref Range   Phosphorus 4.0 2.5 - 4.6 mg/dL  Magnesium     Status: None   Collection Time: 05/23/17  2:07 AM  Result Value Ref Range   Magnesium 2.0 1.7 - 2.4 mg/dL  Hepatic function panel     Status: Abnormal   Collection Time: 05/23/17  2:07 AM  Result Value Ref Range   Total Protein 7.9 6.5 - 8.1 g/dL   Albumin 2.2 (L) 3.5 - 5.0 g/dL   AST 27 15 - 41 U/L   ALT 17 17 - 63 U/L   Alkaline Phosphatase 126 38 - 126 U/L   Total Bilirubin 0.8 0.3 - 1.2 mg/dL   Bilirubin, Direct 0.3 0.1 - 0.5 mg/dL   Indirect Bilirubin 0.5 0.3 - 0.9 mg/dL  Glucose, capillary     Status: Abnormal   Collection Time: 05/23/17  3:28 AM  Result Value Ref Range   Glucose-Capillary 126 (H) 65 - 99 mg/dL  Glucose, capillary     Status: Abnormal   Collection Time: 05/23/17  8:45 AM  Result Value Ref Range   Glucose-Capillary 136 (H) 65 - 99 mg/dL  Glucose, capillary     Status: Abnormal   Collection Time: 05/23/17 11:51 AM  Result Value Ref Range   Glucose-Capillary 126 (H) 65 - 99 mg/dL   Comment 1 Notify RN    Comment 2 Document in Chart   Triglycerides     Status: None   Collection Time: 05/23/17  2:35 PM  Result Value Ref Range   Triglycerides 101 <150 mg/dL  Glucose, capillary     Status: Abnormal   Collection  Time: 05/23/17  3:40  PM  Result Value Ref Range   Glucose-Capillary 169 (H) 65 - 99 mg/dL  Glucose, capillary     Status: Abnormal   Collection Time: 05/23/17  7:47 PM  Result Value Ref Range   Glucose-Capillary 150 (H) 65 - 99 mg/dL  Glucose, capillary     Status: Abnormal   Collection Time: 05/23/17 11:46 PM  Result Value Ref Range   Glucose-Capillary 161 (H) 65 - 99 mg/dL  Basic metabolic panel     Status: Abnormal   Collection Time: 05/24/17  3:17 AM  Result Value Ref Range   Sodium 144 135 - 145 mmol/L   Potassium 3.8 3.5 - 5.1 mmol/L   Chloride 112 (H) 101 - 111 mmol/L   CO2 23 22 - 32 mmol/L   Glucose, Bld 116 (H) 65 - 99 mg/dL   BUN 24 (H) 6 - 20 mg/dL   Creatinine, Ser 0.76 0.61 - 1.24 mg/dL   Calcium 9.0 8.9 - 10.3 mg/dL   GFR calc non Af Amer >60 >60 mL/min   GFR calc Af Amer >60 >60 mL/min    Comment: (NOTE) The eGFR has been calculated using the CKD EPI equation. This calculation has not been validated in all clinical situations. eGFR's persistently <60 mL/min signify possible Chronic Kidney Disease.    Anion gap 9 5 - 15  Magnesium     Status: None   Collection Time: 05/24/17  3:17 AM  Result Value Ref Range   Magnesium 2.0 1.7 - 2.4 mg/dL  CBC     Status: Abnormal   Collection Time: 05/24/17  3:17 AM  Result Value Ref Range   WBC 4.9 4.0 - 10.5 K/uL   RBC 3.77 (L) 4.22 - 5.81 MIL/uL   Hemoglobin 11.3 (L) 13.0 - 17.0 g/dL   HCT 34.3 (L) 39.0 - 52.0 %   MCV 91.0 78.0 - 100.0 fL   MCH 30.0 26.0 - 34.0 pg   MCHC 32.9 30.0 - 36.0 g/dL   RDW 13.8 11.5 - 15.5 %   Platelets 273 150 - 400 K/uL  Glucose, capillary     Status: Abnormal   Collection Time: 05/24/17  3:34 AM  Result Value Ref Range   Glucose-Capillary 107 (H) 65 - 99 mg/dL    Recent Results (from the past 240 hour(s))  Blood Culture (routine x 2)     Status: Abnormal   Collection Time: 05/20/17 12:55 PM  Result Value Ref Range Status   Specimen Description BLOOD RIGHT ANTECUBITAL  Final   Special Requests    Final    BOTTLES DRAWN AEROBIC AND ANAEROBIC Blood Culture adequate volume   Culture  Setup Time   Final    IN BOTH AEROBIC AND ANAEROBIC BOTTLES GRAM POSITIVE COCCI IN CHAINS CRITICAL RESULT CALLED TO, READ BACK BY AND VERIFIED WITH: TO GABBOTT(PHARMd) BY TCLEVELAND 05/21/2017 AT 5:06AM    Culture STREPTOCOCCUS PNEUMONIAE (A)  Final   Report Status 05/23/2017 FINAL  Final   Organism ID, Bacteria STREPTOCOCCUS PNEUMONIAE  Final      Susceptibility   Streptococcus pneumoniae - MIC*    ERYTHROMYCIN 4 RESISTANT Resistant     LEVOFLOXACIN 0.5 SENSITIVE Sensitive     PENICILLIN (meningitis) 0.25 RESISTANT Resistant     PENICILLIN (non-meningitis) 0.25 SENSITIVE Sensitive     CEFTRIAXONE (non-meningitis) <=0.12 SENSITIVE Sensitive     CEFTRIAXONE (meningitis) <=0.12 SENSITIVE Sensitive     * STREPTOCOCCUS PNEUMONIAE  Fungus culture, blood     Status:  None (Preliminary result)   Collection Time: 05/20/17 12:55 PM  Result Value Ref Range Status   Specimen Description BLOOD RIGHT ANTECUBITAL  Final   Special Requests   Final    BOTTLES DRAWN AEROBIC AND ANAEROBIC Blood Culture adequate volume   Culture NO GROWTH 3 DAYS  Final   Report Status PENDING  Incomplete  Blood Culture ID Panel (Reflexed)     Status: Abnormal   Collection Time: 05/20/17 12:55 PM  Result Value Ref Range Status   Enterococcus species NOT DETECTED NOT DETECTED Final   Listeria monocytogenes NOT DETECTED NOT DETECTED Final   Staphylococcus species NOT DETECTED NOT DETECTED Final   Staphylococcus aureus NOT DETECTED NOT DETECTED Final   Streptococcus species DETECTED (A) NOT DETECTED Final    Comment: CRITICAL RESULT CALLED TO, READ BACK BY AND VERIFIED WITH: TO GABBOTT(PHARMd) BY TCLEVELAND 05/21/2017 AT 5:06AM    Streptococcus agalactiae NOT DETECTED NOT DETECTED Final   Streptococcus pneumoniae DETECTED (A) NOT DETECTED Final    Comment: CRITICAL RESULT CALLED TO, READ BACK BY AND VERIFIED WITH: TO GABBOTT(PHARMd)  BY TCLEVELAND 05/21/2017 AT 5:06AM    Streptococcus pyogenes NOT DETECTED NOT DETECTED Final   Acinetobacter baumannii NOT DETECTED NOT DETECTED Final   Enterobacteriaceae species NOT DETECTED NOT DETECTED Final   Enterobacter cloacae complex NOT DETECTED NOT DETECTED Final   Escherichia coli NOT DETECTED NOT DETECTED Final   Klebsiella oxytoca NOT DETECTED NOT DETECTED Final   Klebsiella pneumoniae NOT DETECTED NOT DETECTED Final   Proteus species NOT DETECTED NOT DETECTED Final   Serratia marcescens NOT DETECTED NOT DETECTED Final   Haemophilus influenzae NOT DETECTED NOT DETECTED Final   Neisseria meningitidis NOT DETECTED NOT DETECTED Final   Pseudomonas aeruginosa NOT DETECTED NOT DETECTED Final   Candida albicans NOT DETECTED NOT DETECTED Final   Candida glabrata NOT DETECTED NOT DETECTED Final   Candida krusei NOT DETECTED NOT DETECTED Final   Candida parapsilosis NOT DETECTED NOT DETECTED Final   Candida tropicalis NOT DETECTED NOT DETECTED Final  Blood Culture (routine x 2)     Status: None (Preliminary result)   Collection Time: 05/20/17  1:50 PM  Result Value Ref Range Status   Specimen Description BLOOD LEFT FOREARM  Final   Special Requests   Final    BOTTLES DRAWN AEROBIC AND ANAEROBIC Blood Culture results may not be optimal due to an inadequate volume of blood received in culture bottles   Culture NO GROWTH 3 DAYS  Final   Report Status PENDING  Incomplete  CSF culture     Status: Abnormal (Preliminary result)   Collection Time: 05/20/17  4:10 PM  Result Value Ref Range Status   Specimen Description CSF TUBE 2  Final   Special Requests Immunocompromised  Final   Gram Stain   Final    WBC PRESENT, PREDOMINANTLY MONONUCLEAR YEAST CYTOSPIN SMEAR CRITICAL RESULT CALLED TO, READ BACK BY AND VERIFIED WITH: Debby Bud AT 1905 05/20/17 BY L BENFIELD    Culture CRYPTOCOCCUS NEOFORMANS (A)  Final   Report Status PENDING  Incomplete  MRSA PCR Screening     Status: None    Collection Time: 05/20/17  6:28 PM  Result Value Ref Range Status   MRSA by PCR NEGATIVE NEGATIVE Final    Comment:        The GeneXpert MRSA Assay (FDA approved for NASAL specimens only), is one component of a comprehensive MRSA colonization surveillance program. It is not intended to diagnose MRSA infection nor to guide  or monitor treatment for MRSA infections.   Culture, respiratory (NON-Expectorated)     Status: None (Preliminary result)   Collection Time: 05/21/17 12:10 PM  Result Value Ref Range Status   Specimen Description TRACHEAL ASPIRATE  Final   Special Requests NONE  Final   Gram Stain   Final    MODERATE WBC PRESENT,BOTH PMN AND MONONUCLEAR NO ORGANISMS SEEN    Culture   Final    MODERATE GRAM NEGATIVE RODS IDENTIFICATION AND SUSCEPTIBILITIES TO FOLLOW    Report Status PENDING  Incomplete  Culture, blood (Routine X 2) w Reflex to ID Panel     Status: None (Preliminary result)   Collection Time: 05/22/17 11:51 AM  Result Value Ref Range Status   Specimen Description BLOOD LEFT ARM  Final   Special Requests IN PEDIATRIC BOTTLE Blood Culture adequate volume  Final   Culture NO GROWTH 1 DAY  Final   Report Status PENDING  Incomplete  Culture, blood (Routine X 2) w Reflex to ID Panel     Status: None (Preliminary result)   Collection Time: 05/22/17 11:57 AM  Result Value Ref Range Status   Specimen Description BLOOD LEFT HAND  Final   Special Requests IN PEDIATRIC BOTTLE Blood Culture adequate volume  Final   Culture NO GROWTH 1 DAY  Final   Report Status PENDING  Incomplete    Lipid Panel  Recent Labs  05/23/17 1435  TRIG 101    Studies/Results: Dg Chest Port 1 View  Result Date: 05/24/2017 CLINICAL DATA:  Acute respiratory failure, seizure activity, intubated patient. EXAM: PORTABLE CHEST 1 VIEW COMPARISON:  Portable chest x-ray of May 23, 2017 FINDINGS: The lungs are adequately inflated. There is persistent increased density medially in the left  lower lung. The right base has cleared. The heart and pulmonary vascularity are normal. The endotracheal tube tip lies 7.4 cm above the carina. The esophagogastric tube tip and proximal port project below the inferior margin of the image. IMPRESSION: Persistent medial left basilar atelectasis or pneumonia, possibly related aspiration. High positioning of the endotracheal tube. Advancement by 3-4 cm is recommended Electronically Signed   By: David  Martinique M.D.   On: 05/24/2017 07:42   Dg Chest Port 1 View  Result Date: 05/23/2017 CLINICAL DATA:  Intubated patient, HIV, seizure activity current smoker. EXAM: PORTABLE CHEST 1 VIEW COMPARISON:  Chest x-ray of May 22, 2017 FINDINGS: The lungs are adequately inflated. The lung markings remain coarse at the right lung base. The retrocardiac region on the left remains dense medially. The heart and pulmonary vascularity are normal. The endotracheal tube tip lies 3.5 cm above the carina. The esophagogastric tube tip in proximal port project below the inferior margin of the image. IMPRESSION: Persistent bibasilar atelectasis or pneumonia. No pleural effusion or CHF. The support tubes are in reasonable position. Electronically Signed   By: David  Martinique M.D.   On: 05/23/2017 07:30    Medications:   Current Facility-Administered Medications:  .  0.9 %  sodium chloride infusion, 250 mL, Intravenous, PRN, Sharia Reeve, MD .  acetaminophen (TYLENOL) tablet 650 mg, 650 mg, Oral, Q4H PRN, Gladys Damme, Amar, MD .  acetaminophen (TYLENOL) tablet 650 mg, 650 mg, Oral, Q24H, Tommy Medal, Lavell Islam, MD, 650 mg at 05/23/17 1557 .  albuterol (PROVENTIL) (2.5 MG/3ML) 0.083% nebulizer solution 2.5 mg, 2.5 mg, Nebulization, Q2H PRN, Panchal, Amar, MD .  amphotericin B liposome (AMBISOME) 250 mg in dextrose 5 % 500 mL IVPB, 4 mg/kg, Intravenous, Q24H, Duffy Bruce,  MD, Stopped at 05/23/17 1957 .  cefTRIAXone (ROCEPHIN) 2 g in dextrose 5 % 50 mL IVPB, 2 g, Intravenous, Q12H,  Juanito Doom, MD, Stopped at 05/24/17 [phone] .  chlorhexidine gluconate (MEDLINE KIT) (PERIDEX) 0.12 % solution 15 mL, 15 mL, Mouth Rinse, BID, Halford Chessman, Vineet, MD, 15 mL at 05/23/17 2029 .  [COMPLETED] dexamethasone (DECADRON) injection 4 mg, 4 mg, Intravenous, Q6H, 4 mg at 05/24/17 0500 **FOLLOWED BY** dexamethasone (DECADRON) injection 4 mg, 4 mg, Intravenous, Q8H **FOLLOWED BY** [START ON 05/25/2017] dexamethasone (DECADRON) injection 4 mg, 4 mg, Intravenous, Q12H, Rivet, Carly J, MD .  dextrose 5 % and 0.45 % NaCl with KCl 20 mEq/L infusion, , Intravenous, Continuous, Panchal, Amar, MD, Last Rate: 50 mL/hr at 05/24/17 0700 .  diphenhydrAMINE (BENADRYL) capsule 25 mg, 25 mg, Oral, Q24H, Tommy Medal, Lavell Islam, MD, 25 mg at 05/23/17 1557 .  docusate (COLACE) 50 MG/5ML liquid 100 mg, 100 mg, Per Tube, BID PRN, Sharia Reeve, MD .  famotidine (PEPCID) IVPB 20 mg premix, 20 mg, Intravenous, Q12H, Sharia Reeve, MD, Stopped at 05/23/17 2250 .  feeding supplement (PRO-STAT SUGAR FREE 64) liquid 30 mL, 30 mL, Per Tube, BID, Rush Farmer, MD, 30 mL at 05/23/17 2220 .  feeding supplement (VITAL AF 1.2 CAL) liquid 1,000 mL, 1,000 mL, Per Tube, Continuous, Rush Farmer, MD, Last Rate: 70 mL/hr at 05/24/17 0700, 1,000 mL at 05/24/17 0700 .  fentaNYL (SUBLIMAZE) injection 100 mcg, 100 mcg, Intravenous, Q15 min PRN, Duffy Bruce, MD, 100 mcg at 05/20/17 1608 .  fentaNYL (SUBLIMAZE) injection 100 mcg, 100 mcg, Intravenous, Q2H PRN, Duffy Bruce, MD .  fentaNYL (SUBLIMAZE) injection 100 mcg, 100 mcg, Intravenous, Q15 min PRN, Sharia Reeve, MD, 100 mcg at 05/21/17 0959 .  fentaNYL (SUBLIMAZE) injection 100 mcg, 100 mcg, Intravenous, Q2H PRN, Sharia Reeve, MD, 100 mcg at 05/21/17 1445 .  flucytosine (ANCOBON) capsule 1,500 mg, 1,500 mg, Per Tube, Q6H, Chesley Mires, MD, 1,500 mg at 05/24/17 0214 .  hydrALAZINE (APRESOLINE) injection 10 mg, 10 mg, Intravenous, Q4H PRN, Mauri Brooklyn, MD .  insulin aspart  (novoLOG) injection 2-6 Units, 2-6 Units, Subcutaneous, Q4H, Sharia Reeve, MD, 3 Units at 05/23/17 2357 .  levETIRAcetam (KEPPRA) 1,000 mg in sodium chloride 0.9 % 100 mL IVPB, 1,000 mg, Intravenous, Q12H, Greta Doom, MD, Stopped at 05/23/17 2235 .  MEDLINE mouth rinse, 15 mL, Mouth Rinse, 10 times per day, Chesley Mires, MD, 15 mL at 05/24/17 0611 .  meperidine (DEMEROL) injection 25 mg, 25 mg, Intravenous, Q15 min PRN, Halford Chessman, Vineet, MD .  ondansetron (ZOFRAN) injection 4 mg, 4 mg, Intravenous, Q6H PRN, Panchal, Amar, MD .  potassium chloride 20 MEQ/15ML (10%) solution 40 mEq, 40 mEq, Oral, BID, Romona Curls, RPH, 40 mEq at 05/23/17 2220 .  propofol (DIPRIVAN) 1000 MG/100ML infusion, 0-50 mcg/kg/min, Intravenous, Continuous, Duffy Bruce, MD, Last Rate: 13.3 mL/hr at 05/24/17 0700, 34.908 mcg/kg/min at 05/24/17 0700 .  sodium chloride 0.9 % bolus 500 mL, 500 mL, Intravenous, Q24H, Tommy Medal, Lavell Islam, MD, 500 mL at 05/23/17 2031 .  sodium chloride 0.9 % bolus 500 mL, 500 mL, Intravenous, Q24H, Tommy Medal, Lavell Islam, MD, 500 mL at 05/23/17 1558 .  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  1. CSF from LP on 5/27: Glucose 62, 14 WBC (95% lymphocytes), protein 39. Fungal elements noted. On  L-amphotericin B and flucytosine. ID following.  2. Given hypodensity of right internal capsule on CT head, there may be more extensive abnormality within the region of the perforating arteries of the thalami and basal ganglia, due to perivascular infiltration by cryptococcal meningitis. An MRI is needed to further assess.  3. EEG with no seizures or epileptiform discharges. 4. EVD. Followed by Neurosurgery.  5. Strep pneumo bacteremia. On ceftriaxone.   Recommendations: 1. Continue Keppra 1000 mg IV BID 2. Appreciate  neurosurgery assistance. Ventricular drain in place with pressure setting currently at 14 cm water. Clear colorless drainage is noted.  3. Long term EEG discontinued.  4. Continue flucytosine per tube and IV amphotericin B. ID is following.  5. When stable, obtain MRI brain with and without contrast.  6. Wean off sedation, as tolerated.  7. 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   LOS: 4 days   '@Electronically'  signed: Dr. Kerney Elbe 05/24/2017  8:17 AM

## 2017-05-24 NOTE — Progress Notes (Signed)
Eyes open spontaneously Non-purposeful movements R>L EVD remains functional, draining clear CSF.   - Cont drainage at 

## 2017-05-24 NOTE — Progress Notes (Signed)
[facility]    Date of Admission:  05/20/2017   Total days of antibiotics 5        Day 5 L-ampho/flucytosine        Day 5 Ceftriaxone        Day 5 Bactrim        Day 1 Azithro (q week)   ID: Justin Ball is a 26 y.o. man with advanced HIV disease recently on a new regimen x 4 weeks admitted for seizure, unresponsiveness, and 5 day hx of headache/nausea/vomiting prior to admit. 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 He had an emergent right frontoventriculostomy by neurosurgery on 5/28 after showing evidence of excess ICP with decortication and fixed right pupil.   Subjective: Having increased loose stools. Weaning on vent today. Has spontaneous movements of extremities and opens eyes. Not following commands.  Medications:  . acetaminophen  650 mg Oral Q24H  . chlorhexidine gluconate (MEDLINE KIT)  15 mL Mouth Rinse BID  . dexamethasone  4 mg Intravenous Q8H   Followed by  . [START ON 05/25/2017] dexamethasone  4 mg Intravenous Q12H  . diphenhydrAMINE  25 mg Oral Q24H  . feeding supplement (PRO-STAT SUGAR FREE 64)  30 mL Per Tube BID  . flucytosine  1,500 mg Per Tube Q6H  . insulin aspart  2-6 Units Subcutaneous Q4H  . mouth rinse  15 mL Mouth Rinse 10 times per day  . potassium chloride  40 mEq Oral BID  . sulfamethoxazole-trimethoprim  10 mL Per Tube Daily    Objective: Vital signs in last 24 hours: Temp:  [98.2 F (36.8 C)-99.1 F (37.3 C)] 98.4 F (36.9 C) (05/31 1108) Pulse Rate:  [77-115] 105 (05/31 1000) Resp:  [14-24] 22 (05/31 1000) BP: (108-142)/(60-99) 129/80 (05/31 1000) SpO2:  [95 %-100 %] 96 % (05/31 1000) FiO2 (%):  [40 %] 40 % (05/31 0844) Weight:  [141 lb 5 oz (64.1 kg)] 141 lb 5 oz (64.1 kg) (05/31 0500)  General: Young man intubated and sedated on vent, NAD HEENT: Right  frontal ventriculostomy in place, draining clear fluid. ETT in place. Pupils equal and reactive bilaterally.  CV: RRR, no m/g/r Pulm: CTA bilaterally from anterior, weaning on vent Ext: no peripheral edema Neuro: sedated, opens eyes briefly, not following commands   Lab Results  Recent Labs  05/23/17 0207 05/24/17 0317  WBC 5.0 4.9  HGB 10.4* 11.3*  HCT 31.7* 34.3*  NA 142 144  K 3.4* 3.8  CL 112* 112*  CO2 22 23  BUN 23* 24*  CREATININE 0.86 0.76   Liver Panel  Recent Labs  05/23/17 0207  PROT 7.9  ALBUMIN 2.2*  AST 27  ALT 17  ALKPHOS 126  BILITOT 0.8  BILIDIR 0.3  IBILI 0.5   Microbiology:  Studies/Results: Dg Chest Port 1 View  Result Date: 05/24/2017 CLINICAL DATA:  Acute respiratory failure, seizure activity, intubated patient. EXAM: PORTABLE CHEST 1 VIEW COMPARISON:  Portable chest x-ray of May 23, 2017 FINDINGS: The lungs are adequately inflated. There is persistent increased density medially in the left lower lung. The right base has cleared. The heart and pulmonary vascularity are normal. The endotracheal tube tip lies 7.4 cm above the carina. The esophagogastric tube tip and proximal port project below the inferior margin of the image. IMPRESSION: Persistent medial left basilar atelectasis or pneumonia, possibly  related aspiration. High positioning of the endotracheal tube. Advancement by 3-4 cm is recommended Electronically Signed   By: David  Martinique M.D.   On: 05/24/2017 07:42   Dg Chest Port 1 View  Result Date: 05/23/2017 CLINICAL DATA:  Intubated patient, HIV, seizure activity current smoker. EXAM: PORTABLE CHEST 1 VIEW COMPARISON:  Chest x-ray of May 22, 2017 FINDINGS: The lungs are adequately inflated. The lung markings remain coarse at the right lung base. The retrocardiac region on the left remains dense medially. The heart and pulmonary vascularity are normal. The endotracheal tube tip lies 3.5 cm above the carina. The esophagogastric tube tip in  proximal port project below the inferior margin of the image. IMPRESSION: Persistent bibasilar atelectasis or pneumonia. No pleural effusion or CHF. The support tubes are in reasonable position. Electronically Signed   By: David  Martinique M.D.   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 Developed significant increased ICP with decortication requiring emergent EVD on 5/28.   Cryptococcal Meningitis - Continue L-ampho plus flucytosine - Continue to taper dexamethasone - Agree with getting MRI brain - Continue monitoring electrolytes, CBC, Mg, appreciate pharmacy's help  Bordetella Bronchitis, PNA? - Possible infiltrate in left basilar area concerning for pneumonia - Bordetella growing in trach aspirate from 5/28 that is resistant to Cefazolin - Will see if aspirate is sensitive to Zosyn or Unasyn. Will discuss with Dr. Baxter Flattery.  HIV disease/AIDS - Hold ART - CD4 count 2, VL >83,000. This is not consistent with an IRIS picture and furthermore patient had not been taking his ART at home so very unlikely.   s/p emergent right frontal ventriculostomy, POD 3 - Clear drainage, ~260 cc over last 24 hours - Appreciate Neurosurgery and Neurology's management  Seizures - None since admission - Continue Keppra 1000 mg Q12H  Fixed pupil- resolved. Pupils now reactive bilaterally.   Strep pneumoniae bacteremia - May switch to Unasyn vs Zosyn. Will discuss with Dr. Baxter Flattery. - Repeat blood cx from 5/29 NGTD  OI Prophylaxis - Continue Bactrim - Start qweek Azithro today  Chronic hepatitis B - LFTs reassuring on 5/30, would check every other day - Hep B VL pending   Attending note to follow.  Albin Felling, MD, MPH Internal Medicine Resident, PGY-3  05/24/2017, 11:28 AM

## 2017-05-24 NOTE — Progress Notes (Signed)
Nutrition Follow-up  DOCUMENTATION CODES:   Non-severe (moderate) malnutrition in context of chronic illness  INTERVENTION:   D/C Prostat  Decrease Vital AF 1.2 to 65 ml/hr (1560 ml/day) Provides: 1872 kcal, 117 grams protein, and 1265 ml free water.   TF regimen and propofol at current rate providing 1972 total kcal/day   NUTRITION DIAGNOSIS:   Malnutrition (Moderate) related to chronic illness (AIDS) as evidenced by mild depletion of muscle mass, mild depletion of body fat. Ongoing.   GOAL:   Provide needs based on ASPEN/SCCM guidelines Met.   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   Patient is currently intubated on ventilator support MV: 10.6 L/min Temp (24hrs), Avg:98.6 F (37 C), Min:98.2 F (36.8 C), Max:99.1 F (37.3 C)  Propofol: 3.8 ml/hr provides: 100 kcal - per RN low dose needed due to cough  Medications reviewed and include: decadron Labs reviewed: magnesium 2.0 CBG's: [phone] Nutrition-Focused physical exam completed. Findings are mild/moderate fat depletion upper arm/thoacic, mild/moderate to severe muscle depletion BLE, clavicles, temple, and no edema.  Mom at bedside and reports ongoing weight loss PTA accompanied by decreased appetite.  Current TF: Vital AF 1.2 @ 70 ml/hr with 30 ml Prostat BID, provides: 2216 kcal, 156 grams protein  Diet Order:  Diet NPO time specified  Skin:  Reviewed, no issues  Last BM:  200 ml today via rectal tube  Height:   Ht Readings from Last 1  Encounters:  05/20/17 _0  (1.854 m)    Weight:   Wt Readings from Last 1 Encounters:  05/24/17 141 lb 5 oz (64.1 kg)    Ideal Body Weight:  83.6 kg  BMI:  Body mass index is 18.64 kg/m.  Estimated Nutritional Needs:   Kcal:  1996   Protein:  107-142 g  Fluid:  >/= 2 L  EDUCATION NEEDS:   No education needs identified at this time  [place], [place], [place] Pager [phone] After Hours Pager

## 2017-05-25 ENCOUNTER — Inpatient Hospital Stay (HOSPITAL_COMMUNITY): Payer: No Typology Code available for payment source

## 2017-05-25 DIAGNOSIS — A3781 Whooping cough due to other Bordetella species with pneumonia: Secondary | ICD-10-CM

## 2017-05-25 LAB — GASTROINTESTINAL PANEL BY PCR, STOOL (REPLACES STOOL CULTURE)
ASTROVIRUS: NOT DETECTED
Adenovirus F40/41: NOT DETECTED
CAMPYLOBACTER SPECIES: DETECTED — AB
Cryptosporidium: NOT DETECTED
Cyclospora cayetanensis: NOT DETECTED
E. COLI O157: NOT DETECTED
ENTAMOEBA HISTOLYTICA: NOT DETECTED
Enteroaggregative E coli (EAEC): NOT DETECTED
Enterotoxigenic E coli (ETEC): NOT DETECTED
Giardia lamblia: DETECTED — AB
NOROVIRUS GI/GII: NOT DETECTED
Plesimonas shigelloides: NOT DETECTED
Rotavirus A: NOT DETECTED
SAPOVIRUS (I, II, IV, AND V): DETECTED — AB
Salmonella species: NOT DETECTED
Shiga like toxin producing E coli (STEC): DETECTED — AB
Shigella/Enteroinvasive E coli (EIEC): NOT DETECTED
VIBRIO CHOLERAE: NOT DETECTED
Vibrio species: NOT DETECTED
Yersinia enterocolitica: NOT DETECTED

## 2017-05-25 LAB — GLUCOSE, CAPILLARY
GLUCOSE-CAPILLARY: 125 mg/dL — AB (ref 65–99)
GLUCOSE-CAPILLARY: 140 mg/dL — AB (ref 65–99)
Glucose-Capillary: 131 mg/dL — ABNORMAL HIGH (ref 65–99)
Glucose-Capillary: 135 mg/dL — ABNORMAL HIGH (ref 65–99)
Glucose-Capillary: 138 mg/dL — ABNORMAL HIGH (ref 65–99)
Glucose-Capillary: 142 mg/dL — ABNORMAL HIGH (ref 65–99)

## 2017-05-25 LAB — BASIC METABOLIC PANEL
Anion gap: 10 (ref 5–15)
BUN: 23 mg/dL — AB (ref 6–20)
CALCIUM: 9 mg/dL (ref 8.9–10.3)
CO2: 18 mmol/L — ABNORMAL LOW (ref 22–32)
CREATININE: 0.61 mg/dL (ref 0.61–1.24)
Chloride: 113 mmol/L — ABNORMAL HIGH (ref 101–111)
GFR calc Af Amer: >60 mL/min (ref >60)
Glucose, Bld: 124 mg/dL — ABNORMAL HIGH (ref 65–99)
POTASSIUM: 4.2 mmol/L (ref 3.5–5.1)
SODIUM: 141 mmol/L (ref 135–145)

## 2017-05-25 LAB — CULTURE, BLOOD (ROUTINE X 2): CULTURE: NO GROWTH

## 2017-05-25 LAB — MAGNESIUM: Magnesium: 1.8 mg/dL (ref 1.7–2.4)

## 2017-05-25 LAB — PHOSPHORUS: PHOSPHORUS: 4.1 mg/dL (ref 2.5–4.6)

## 2017-05-25 MED ORDER — DEXTROSE 5 % IV SOLN
2.0000 g | INTRAVENOUS | Status: DC
  Administered 2017-05-26 – 2017-05-31 (×6): 2 g via INTRAVENOUS
  Filled 2017-05-25 (×6): qty 2

## 2017-05-25 MED ORDER — POTASSIUM CHLORIDE 20 MEQ/15ML (10%) PO SOLN
40.0000 meq | Freq: Every day | ORAL | Status: DC
  Administered 2017-05-25 – 2017-05-30 (×6): 40 meq via ORAL
  Filled 2017-05-25 (×6): qty 30

## 2017-05-25 MED ORDER — GADOBENATE DIMEGLUMINE 529 MG/ML IV SOLN
15.0000 mL | Freq: Once | INTRAVENOUS | Status: AC | PRN
  Administered 2017-05-25: 14 mL via INTRAVENOUS

## 2017-05-25 MED ORDER — MAGNESIUM SULFATE 2 GM/50ML IV SOLN
2.0000 g | Freq: Once | INTRAVENOUS | Status: AC
  Administered 2017-05-25: 2 g via INTRAVENOUS
  Filled 2017-05-25: qty 50

## 2017-05-25 NOTE — Progress Notes (Signed)
EVD remains functional Neurologically stable Continue drainage at 

## 2017-05-25 NOTE — Progress Notes (Signed)
Patient transported to MRI and back to 4N28 with no complications. 

## 2017-05-25 NOTE — Progress Notes (Signed)
eLink Physician-Brief Progress Note Patient Name: Ola SpurrLinston R Diveley DOB: 10/21/1991 MRN: [id number]   Date of Service  05/25/2017  HPI/Events of Note  GI panel is positive for camplyobacter, shiga toxin e coli, giardia and sapovirus  eICU Interventions  He is not having GI symptoms as per the nurse. Will defer to ID if we need to treat this.      Intervention Category Evaluation Type: Other  Ainhoa Rallo 05/25/2017, 8:56 PM

## 2017-05-25 NOTE — Progress Notes (Signed)
Subjective: Moving a little bit more per nursing.   Objective: Current vital signs: BP 125/79   Pulse (!) 122   Temp 99.2 F (37.3 C) (Axillary)   Resp (!) 31   Ht _0  (1.854 m)   Wt 65.3 kg (143 lb 15.4 oz)   SpO2 100%   BMI 18.99 kg/m  Vital signs in last 24 hours: Temp:  [97.3 F (36.3 C)-99.2 F (37.3 C)] 99.2 F (37.3 C) (06/01 1552) Pulse Rate:  [86-154] 122 (06/01 1607) Resp:  [16-31] 31 (06/01 1607) BP: (117-161)/(63-107) 125/79 (06/01 1607) SpO2:  [91 %-100 %] 100 % (06/01 1607) FiO2 (%):  [30 %-40 %] 30 % (06/01 1607) Weight:  [65.3 kg (143 lb 15.4 oz)] 65.3 kg (143 lb 15.4 oz) (06/01 0500)  Intake/Output from previous day: 05/31 0701 - 06/01 0700 In: 3947.4 [I.V.:1417.4; NG/GT:1610; IV Piggyback:920] Out: 2556 [Urine:1325; Emesis/NG output:500; Drains:231; Stool:500] Intake/Output this shift: Total I/O In: 1436.1 [I.V.:695.4; NG/GT:580.7; IV Piggyback:160] Out: 920 [Urine:800; Drains:120] Nutritional status: Diet NPO time specified  Neurologic Exam: Ment:Eyes open, does not track or fixate on visual stimuli. Does not follow commands or attempt to communicate. Moves left upper extremity semipurposefully to noxious stimulus.  JK:DTOIZ pupil 4 mm and reactive, Left pupil 3 mm and reactive. Mild esotropia noted. Face flaccidly symmetric.  Motor/Sensory:Decreased muscle mass bilateral upper extremities. Spontaneously moves LUE with 2/5 strength nonpurposefully. Also moves left upper extremity semipurposefully to noxious stimulus. Weakly moves legs to noxious bilaterally. Tone decreased x 4.  Reflexes:Hypoactive x 4   Lab Results: Results for orders placed or performed during the hospital encounter of 05/20/17 (from the past 48 hour(s))  Glucose, capillary     Status: Abnormal   Collection Time: 05/23/17  7:47 PM  Result Value Ref Range   Glucose-Capillary 150 (H) 65 - 99 mg/dL  Glucose, capillary     Status: Abnormal   Collection Time: 05/23/17 11:46 PM   Result Value Ref Range   Glucose-Capillary 161 (H) 65 - 99 mg/dL  Basic metabolic panel     Status: Abnormal   Collection Time: 05/24/17  3:17 AM  Result Value Ref Range   Sodium 144 135 - 145 mmol/L   Potassium 3.8 3.5 - 5.1 mmol/L   Chloride 112 (H) 101 - 111 mmol/L   CO2 23 22 - 32 mmol/L   Glucose, Bld 116 (H) 65 - 99 mg/dL   BUN 24 (H) 6 - 20 mg/dL   Creatinine, Ser 0.76 0.61 - 1.24 mg/dL   Calcium 9.0 8.9 - 10.3 mg/dL   GFR calc non Af Amer >60 >60 mL/min   GFR calc Af Amer >60 >60 mL/min    Comment: (NOTE) The eGFR has been calculated using the CKD EPI equation. This calculation has not been validated in all clinical situations. eGFR's persistently <60 mL/min signify possible Chronic Kidney Disease.    Anion gap 9 5 - 15  Magnesium     Status: None   Collection Time: 05/24/17  3:17 AM  Result Value Ref Range   Magnesium 2.0 1.7 - 2.4 mg/dL  CBC     Status: Abnormal   Collection Time: 05/24/17  3:17 AM  Result Value Ref Range   WBC 4.9 4.0 - 10.5 K/uL   RBC 3.77 (L) 4.22 - 5.81 MIL/uL   Hemoglobin 11.3 (L) 13.0 - 17.0 g/dL   HCT 34.3 (L) 39.0 - 52.0 %   MCV 91.0 78.0 - 100.0 fL   MCH 30.0 26.0 - 34.0  pg   MCHC 32.9 30.0 - 36.0 g/dL   RDW 13.8 11.5 - 15.5 %   Platelets 273 150 - 400 K/uL  Glucose, capillary     Status: Abnormal   Collection Time: 05/24/17  3:34 AM  Result Value Ref Range   Glucose-Capillary 107 (H) 65 - 99 mg/dL  Glucose, capillary     Status: Abnormal   Collection Time: 05/24/17  9:09 AM  Result Value Ref Range   Glucose-Capillary 137 (H) 65 - 99 mg/dL  Glucose, capillary     Status: Abnormal   Collection Time: 05/24/17 11:09 AM  Result Value Ref Range   Glucose-Capillary 120 (H) 65 - 99 mg/dL  Glucose, capillary     Status: None   Collection Time: 05/24/17  3:49 PM  Result Value Ref Range   Glucose-Capillary 96 65 - 99 mg/dL  Glucose, capillary     Status: Abnormal   Collection Time: 05/24/17  8:34 PM  Result Value Ref Range    Glucose-Capillary 140 (H) 65 - 99 mg/dL   Comment 1 Notify RN    Comment 2 Document in Chart   Glucose, capillary     Status: Abnormal   Collection Time: 05/25/17 12:05 AM  Result Value Ref Range   Glucose-Capillary 135 (H) 65 - 99 mg/dL   Comment 1 Notify RN    Comment 2 Document in Chart   Glucose, capillary     Status: Abnormal   Collection Time: 05/25/17  3:16 AM  Result Value Ref Range   Glucose-Capillary 138 (H) 65 - 99 mg/dL   Comment 1 Notify RN    Comment 2 Document in Chart   Basic metabolic panel     Status: Abnormal   Collection Time: 05/25/17  3:59 AM  Result Value Ref Range   Sodium 141 135 - 145 mmol/L   Potassium 4.2 3.5 - 5.1 mmol/L   Chloride 113 (H) 101 - 111 mmol/L   CO2 18 (L) 22 - 32 mmol/L   Glucose, Bld 124 (H) 65 - 99 mg/dL   BUN 23 (H) 6 - 20 mg/dL   Creatinine, Ser 0.61 0.61 - 1.24 mg/dL   Calcium 9.0 8.9 - 10.3 mg/dL   GFR calc non Af Amer >60 >60 mL/min   GFR calc Af Amer >60 >60 mL/min    Comment: (NOTE) The eGFR has been calculated using the CKD EPI equation. This calculation has not been validated in all clinical situations. eGFR's persistently <60 mL/min signify possible Chronic Kidney Disease.    Anion gap 10 5 - 15  Phosphorus     Status: None   Collection Time: 05/25/17  3:59 AM  Result Value Ref Range   Phosphorus 4.1 2.5 - 4.6 mg/dL  Magnesium     Status: None   Collection Time: 05/25/17  3:59 AM  Result Value Ref Range   Magnesium 1.8 1.7 - 2.4 mg/dL  Glucose, capillary     Status: Abnormal   Collection Time: 05/25/17  7:35 AM  Result Value Ref Range   Glucose-Capillary 125 (H) 65 - 99 mg/dL  Glucose, capillary     Status: Abnormal   Collection Time: 05/25/17 11:35 AM  Result Value Ref Range   Glucose-Capillary 131 (H) 65 - 99 mg/dL  Glucose, capillary     Status: Abnormal   Collection Time: 05/25/17  3:49 PM  Result Value Ref Range   Glucose-Capillary 140 (H) 65 - 99 mg/dL    Recent Results (from the past 240 hour(s))  Blood Culture (routine x 2)     Status: Abnormal   Collection Time: 05/20/17 12:55 PM  Result Value Ref Range Status   Specimen Description BLOOD RIGHT ANTECUBITAL  Final   Special Requests   Final    BOTTLES DRAWN AEROBIC AND ANAEROBIC Blood Culture adequate volume   Culture  Setup Time   Final    IN BOTH AEROBIC AND ANAEROBIC BOTTLES GRAM POSITIVE COCCI IN CHAINS CRITICAL RESULT CALLED TO, READ BACK BY AND VERIFIED WITH: TO GABBOTT(PHARMd) BY TCLEVELAND 05/21/2017 AT 5:06AM    Culture STREPTOCOCCUS PNEUMONIAE (A)  Final   Report Status 05/23/2017 FINAL  Final   Organism ID, Bacteria STREPTOCOCCUS PNEUMONIAE  Final      Susceptibility   Streptococcus pneumoniae - MIC*    ERYTHROMYCIN 4 RESISTANT Resistant     LEVOFLOXACIN 0.5 SENSITIVE Sensitive     PENICILLIN (meningitis) 0.25 RESISTANT Resistant     PENICILLIN (non-meningitis) 0.25 SENSITIVE Sensitive     CEFTRIAXONE (non-meningitis) <=0.12 SENSITIVE Sensitive     CEFTRIAXONE (meningitis) <=0.12 SENSITIVE Sensitive     * STREPTOCOCCUS PNEUMONIAE  Fungus culture, blood     Status: None (Preliminary result)   Collection Time: 05/20/17 12:55 PM  Result Value Ref Range Status   Specimen Description BLOOD RIGHT ANTECUBITAL  Final   Special Requests   Final    BOTTLES DRAWN AEROBIC AND ANAEROBIC Blood Culture adequate volume   Culture NO GROWTH 5 DAYS  Final   Report Status PENDING  Incomplete  Blood Culture ID Panel (Reflexed)     Status: Abnormal   Collection Time: 05/20/17 12:55 PM  Result Value Ref Range Status   Enterococcus species NOT DETECTED NOT DETECTED Final   Listeria monocytogenes NOT DETECTED NOT DETECTED Final   Staphylococcus species NOT DETECTED NOT DETECTED Final   Staphylococcus aureus NOT DETECTED NOT DETECTED Final   Streptococcus species DETECTED (A) NOT DETECTED Final    Comment: CRITICAL RESULT CALLED TO, READ BACK BY AND VERIFIED WITH: TO GABBOTT(PHARMd) BY TCLEVELAND 05/21/2017 AT 5:06AM     Streptococcus agalactiae NOT DETECTED NOT DETECTED Final   Streptococcus pneumoniae DETECTED (A) NOT DETECTED Final    Comment: CRITICAL RESULT CALLED TO, READ BACK BY AND VERIFIED WITH: TO GABBOTT(PHARMd) BY TCLEVELAND 05/21/2017 AT 5:06AM    Streptococcus pyogenes NOT DETECTED NOT DETECTED Final   Acinetobacter baumannii NOT DETECTED NOT DETECTED Final   Enterobacteriaceae species NOT DETECTED NOT DETECTED Final   Enterobacter cloacae complex NOT DETECTED NOT DETECTED Final   Escherichia coli NOT DETECTED NOT DETECTED Final   Klebsiella oxytoca NOT DETECTED NOT DETECTED Final   Klebsiella pneumoniae NOT DETECTED NOT DETECTED Final   Proteus species NOT DETECTED NOT DETECTED Final   Serratia marcescens NOT DETECTED NOT DETECTED Final   Haemophilus influenzae NOT DETECTED NOT DETECTED Final   Neisseria meningitidis NOT DETECTED NOT DETECTED Final   Pseudomonas aeruginosa NOT DETECTED NOT DETECTED Final   Candida albicans NOT DETECTED NOT DETECTED Final   Candida glabrata NOT DETECTED NOT DETECTED Final   Candida krusei NOT DETECTED NOT DETECTED Final   Candida parapsilosis NOT DETECTED NOT DETECTED Final   Candida tropicalis NOT DETECTED NOT DETECTED Final  Blood Culture (routine x 2)     Status: None   Collection Time: 05/20/17  1:50 PM  Result Value Ref Range Status   Specimen Description BLOOD LEFT FOREARM  Final   Special Requests   Final    BOTTLES DRAWN AEROBIC AND ANAEROBIC Blood Culture results may not be  optimal due to an inadequate volume of blood received in culture bottles   Culture NO GROWTH 5 DAYS  Final   Report Status 05/25/2017 FINAL  Final  CSF culture     Status: Abnormal   Collection Time: 05/20/17  4:10 PM  Result Value Ref Range Status   Specimen Description CSF TUBE 2  Final   Special Requests Immunocompromised  Final   Gram Stain   Final    WBC PRESENT, PREDOMINANTLY MONONUCLEAR YEAST CYTOSPIN SMEAR CRITICAL RESULT CALLED TO, READ BACK BY AND VERIFIED  WITH: Debby Bud AT 1905 05/20/17 BY L BENFIELD    Culture CRYPTOCOCCUS NEOFORMANS (A)  Final   Report Status 05/24/2017 FINAL  Final  MRSA PCR Screening     Status: None   Collection Time: 05/20/17  6:28 PM  Result Value Ref Range Status   MRSA by PCR NEGATIVE NEGATIVE Final    Comment:        The GeneXpert MRSA Assay (FDA approved for NASAL specimens only), is one component of a comprehensive MRSA colonization surveillance program. It is not intended to diagnose MRSA infection nor to guide or monitor treatment for MRSA infections.   Culture, respiratory (NON-Expectorated)     Status: None (Preliminary result)   Collection Time: 05/21/17 12:10 PM  Result Value Ref Range Status   Specimen Description TRACHEAL ASPIRATE  Final   Special Requests NONE  Final   Gram Stain   Final    MODERATE WBC PRESENT,BOTH PMN AND MONONUCLEAR NO ORGANISMS SEEN    Culture MODERATE BORDETELLA BRONCHISEPTICA  Final   Report Status PENDING  Incomplete   Organism ID, Bacteria BORDETELLA BRONCHISEPTICA  Final      Susceptibility   Bordetella bronchiseptica - MIC*    CEFEPIME 16 INTERMEDIATE Intermediate     CEFAZOLIN >=64 RESISTANT Resistant     GENTAMICIN 2 SENSITIVE Sensitive     CIPROFLOXACIN 1 SENSITIVE Sensitive     IMIPENEM 0.5 SENSITIVE Sensitive     TRIMETH/SULFA 80 RESISTANT Resistant     * MODERATE BORDETELLA BRONCHISEPTICA  Culture, blood (Routine X 2) w Reflex to ID Panel     Status: None (Preliminary result)   Collection Time: 05/22/17 11:51 AM  Result Value Ref Range Status   Specimen Description BLOOD LEFT ARM  Final   Special Requests IN PEDIATRIC BOTTLE Blood Culture adequate volume  Final   Culture NO GROWTH 3 DAYS  Final   Report Status PENDING  Incomplete  Culture, blood (Routine X 2) w Reflex to ID Panel     Status: None (Preliminary result)   Collection Time: 05/22/17 11:57 AM  Result Value Ref Range Status   Specimen Description BLOOD LEFT HAND  Final   Special  Requests IN PEDIATRIC BOTTLE Blood Culture adequate volume  Final   Culture NO GROWTH 3 DAYS  Final   Report Status PENDING  Incomplete    Lipid Panel  Recent Labs  05/23/17 1435  TRIG 101    Studies/Results: [name] Wo Contrast  Result Date: 05/25/2017 CLINICAL DATA:  Headache and seizures EXAM: MRI HEAD WITHOUT AND WITH CONTRAST TECHNIQUE: Multiplanar, multiecho pulse sequences of the brain and surrounding structures were obtained without and with intravenous contrast. CONTRAST:  71m MULTIHANCE GADOBENATE DIMEGLUMINE 529 MG/ML IV SOLN COMPARISON:  Head CT 05/21/2017 FINDINGS: Brain: The midline structures are normal. There is cortical diffusion restriction within the left aspect of the midbrain, both cerebellar hemispheres, both medial temporal lobes, both occipital lobes and both frontal lobes  with multiple foci of the deep gray nuclei. There is associated hyperintense T2 weighted signal with some of these locations. There is a right frontal approach ventriculostomy catheter with tip near the foramina of [place]. There is subarachnoid versus leptomeningeal contrast enhancement within the above described areas of diffusion restriction. There is a small amount of enhancement along the course of the shunt catheter with intrinsic T1 shortening consistent with a small focus of blood. No intraparenchymal hematoma or chronic microhemorrhage. There is mild-to-moderate atrophy. Vascular: Major intracranial arterial and venous sinus flow voids are preserved. Skull and upper cervical spine: The visualized skull base, calvarium, upper cervical spine and extracranial soft tissues are normal. Sinuses/Orbits: No fluid levels or advanced mucosal thickening. No mastoid effusion. Normal orbits. IMPRESSION: 1. Multifocal cortical diffusion restriction involving both cerebellar hemispheres and the bilateral temporal, occipital and frontal lobes, as well as the deep gray nuclei. 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 Postictal changes could have a similar appearance, but the pattern is more typical of hypoxic ischemic injury. 2. Areas of subarachnoid or leptomeningeal contrast enhancement at the sites of diffusion restriction may be secondary to blood-brain barrier breakdown in the setting of a subacute ischemic event. In the context of HIV, however, an infectious meningitis should also be considered. 3. Small amount of blood adjacent to the right frontal approach extraventricular drain. Electronically Signed   By: Ulyses Jarred M.D.   On: 05/25/2017 06:11   Dg Chest Port 1 View  Result Date: 05/24/2017 CLINICAL DATA:  Acute respiratory failure, seizure activity, intubated patient. EXAM: PORTABLE CHEST 1 VIEW COMPARISON:  Portable chest x-ray of May 23, 2017 FINDINGS: The lungs are adequately inflated. There is persistent increased density medially in the left lower lung. The right base has cleared. The heart and pulmonary vascularity are normal. The endotracheal tube tip lies 7.4 cm above the carina. The esophagogastric tube tip and proximal port project below the inferior margin of the image. IMPRESSION: Persistent medial left basilar atelectasis or pneumonia, possibly related aspiration. High positioning of the endotracheal tube. Advancement by 3-4 cm is recommended Electronically Signed   By: David  Martinique M.D.   On: 05/24/2017 07:42    Medications:   Current Facility-Administered Medications:  .  0.9 %  sodium chloride infusion, 250 mL, Intravenous, PRN, Sharia Reeve, MD, Last Rate: 10 mL/hr at 05/25/17 1500, 250 mL at 05/25/17 1500 .  acetaminophen (TYLENOL) tablet 650 mg, 650 mg, Oral, Q4H PRN, Gladys Damme, Amar, MD .  acetaminophen (TYLENOL) tablet 650 mg, 650 mg, Oral, Q24H, Tommy Medal, Lavell Islam, MD, 650 mg at 05/24/17 1637 .  albuterol (PROVENTIL) (2.5 MG/3ML) 0.083% nebulizer solution 2.5 mg, 2.5 mg, Nebulization, Q2H PRN, Panchal, Amar, MD .  amphotericin B liposome  (AMBISOME) 250 mg in dextrose 5 % 500 mL IVPB, 4 mg/kg, Intravenous, Q24H, Duffy Bruce, MD, Stopped at 05/24/17 1910 .  [START ON 05/31/2017] azithromycin (ZITHROMAX) 200 MG/5ML suspension 1,200 mg, 1,200 mg, Per Tube, Weekly, Carlyle Basques, MD .  Derrill Memo ON 05/26/2017] cefTRIAXone (ROCEPHIN) 2 g in dextrose 5 % 50 mL IVPB, 2 g, Intravenous, Q24H, Carlyle Basques, MD .  chlorhexidine gluconate (MEDLINE KIT) (PERIDEX) 0.12 % solution 15 mL, 15 mL, Mouth Rinse, BID, Halford Chessman, Vineet, MD, 15 mL at 05/25/17 0845 .  [COMPLETED] dexamethasone (DECADRON) injection 4 mg, 4 mg, Intravenous, Q6H, 4 mg at 05/24/17 0500 **FOLLOWED BY** [COMPLETED] dexamethasone (DECADRON) injection 4 mg, 4 mg, Intravenous, Q8H, 4 mg at 05/25/17 0611 **FOLLOWED BY** dexamethasone (DECADRON)  injection 4 mg, 4 mg, Intravenous, Q12H, Rivet, Carly J, MD, 4 mg at 05/25/17 1351 .  dextrose 5 % and 0.45 % NaCl with KCl 20 mEq/L infusion, , Intravenous, Continuous, Panchal, Amar, MD, Last Rate: 50 mL/hr at 05/25/17 1500 .  diphenhydrAMINE (BENADRYL) capsule 25 mg, 25 mg, Oral, Q24H, Tommy Medal, Lavell Islam, MD, 25 mg at 05/24/17 1637 .  docusate (COLACE) 50 MG/5ML liquid 100 mg, 100 mg, Per Tube, BID PRN, Sharia Reeve, MD .  famotidine (PEPCID) IVPB 20 mg premix, 20 mg, Intravenous, Q12H, Sharia Reeve, MD, Stopped at 05/25/17 1051 .  feeding supplement (VITAL AF 1.2 CAL) liquid 1,000 mL, 1,000 mL, Per Tube, Continuous, Byrum, Rose Fillers, MD, Last Rate: 65 mL/hr at 05/25/17 1500, 1,000 mL at 05/25/17 1500 .  fentaNYL (SUBLIMAZE) injection 100 mcg, 100 mcg, Intravenous, Q15 min PRN, Duffy Bruce, MD, 100 mcg at 05/20/17 1608 .  fentaNYL (SUBLIMAZE) injection 100 mcg, 100 mcg, Intravenous, Q2H PRN, Duffy Bruce, MD, 100 mcg at 05/25/17 1351 .  fentaNYL (SUBLIMAZE) injection 100 mcg, 100 mcg, Intravenous, Q15 min PRN, Sharia Reeve, MD, 100 mcg at 05/21/17 0959 .  fentaNYL (SUBLIMAZE) injection 100 mcg, 100 mcg, Intravenous, Q2H PRN, Sharia Reeve, MD, 100 mcg at 05/25/17 0207 .  flucytosine (ANCOBON) capsule 1,500 mg, 1,500 mg, Per Tube, Q6H, Chesley Mires, MD, 1,500 mg at 05/25/17 1353 .  hydrALAZINE (APRESOLINE) injection 10 mg, 10 mg, Intravenous, Q4H PRN, Mauri Brooklyn, MD, 10 mg at 05/25/17 1020 .  insulin aspart (novoLOG) injection 2-6 Units, 2-6 Units, Subcutaneous, Q4H, Sharia Reeve, MD, 2 Units at 05/25/17 1245 .  levETIRAcetam (KEPPRA) 1,000 mg in sodium chloride 0.9 % 100 mL IVPB, 1,000 mg, Intravenous, Q12H, Greta Doom, MD, Stopped at 05/25/17 1036 .  MEDLINE mouth rinse, 15 mL, Mouth Rinse, 10 times per day, Chesley Mires, MD, 15 mL at 05/25/17 1357 .  ondansetron (ZOFRAN) injection 4 mg, 4 mg, Intravenous, Q6H PRN, Panchal, Amar, MD .  potassium chloride 20 MEQ/15ML (10%) solution 40 mEq, 40 mEq, Oral, Daily, Rumbarger, Valeda Malm, RPH, 40 mEq at 05/25/17 1021 .  propofol (DIPRIVAN) 1000 MG/100ML infusion, 0-50 mcg/kg/min, Intravenous, Continuous, Duffy Bruce, MD, Last Rate: 17.1 mL/hr at 05/25/17 1550, 45 mcg/kg/min at 05/25/17 1550 .  sodium chloride 0.9 % bolus 500 mL, 500 mL, Intravenous, Q24H, Tommy Medal, Lavell Islam, MD .  sodium chloride 0.9 % bolus 500 mL, 500 mL, Intravenous, Q24H, Tommy Medal, Lavell Islam, MD, 500 mL at 05/24/17 1639 .  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 Fungal elements noted. On L-amphotericin B and flucytosine. ID following.  2. MRI obtained today reveals the following: Multifocal cortical diffusion restriction involving both cerebellar hemispheres and the bilateral temporal, occipital and frontal lobes, as well as the deep gray nuclei. 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 Postictal changes could, however, have a similar appearance. Supportive of strokes secondary to perivascular infection/inflammation, areas of subarachnoid or leptomeningeal contrast enhancement at the sites of diffusion restriction are also noted.  3. EEG was with no seizures or epileptiform discharges.This would make postictal changes less likely as the etiology for the cortical restricted diffusion on MRI.  4. EVD. Followed by Neurosurgery.  5. Strep pneumo bacteremia. On ceftriaxone.  Recommendations: 1. Continue Keppra 1000 mg IV BID 2. Appreciate neurosurgery assistance. Ventricular drain in place, with clear colorless CSF drainage noted.  3. Long term EEG discontinued.  4. Continue flucytosine per tube and IV amphotericin B. ID is following.  5. Wean off sedation, as tolerated.  6. Given that there is now low suspicion for IRIS, CCM plan is for weaning steroids to off given potential deleterious effects in the setting of cryptococcal meningitis.    LOS: 5 days   _0  signed: Dr. Kerney Elbe 05/25/2017  4:11 PM

## 2017-05-25 NOTE — Progress Notes (Signed)
PULMONARY / CRITICAL CARE MEDICINE   Name: Justin Ball MRN: [id number] DOB: 12-28-1990    ADMISSION DATE:  05/20/2017  CHIEF COMPLAINT:  Seizures, altered MS.   HISTORY OF PRESENT ILLNESS:   Patient has been having headache for past few days. He was seen in ER 2 days ago and discharged. Today he had witnessed seizures by family and repeat in ER. When brought in to ER, he was post ictal but did follow some commands. Then he had repeat seizures and was intubated. He had disconjugate eyes which resolved.   He has also been having fevers lately. Grandmother expressed concern of his compliance with HIV meds.   26 year old male with history ofHIV AIDS with history of noncompliance here with altered mental status. History is limited as patient was pulled out of his car unresponsive. Family did report that he thought the patient had a seizure. On arrival, patient is postictal and unable to provide history.  Per review of records, patient was seen 3 days ago in the ER for headache. He was afebrile and otherwise very well-appearing at that time. He had a CT scan which was negative and sent home with supportive care.   SUBJECTIVE / Interval events:  Propofol at 30 Followed commands for me today! MRI performed this morning as below  VITAL SIGNS: BP (!) 161/93   Pulse (!) 111   Temp 97.6 F (36.4 C) (Axillary)   Resp (!) 21   Ht 6\' 1"  (1.854 m)   Wt 65.3 kg (143 lb 15.4 oz)   SpO2 91%   BMI 18.99 kg/m   HEMODYNAMICS:    VENTILATOR SETTINGS: Vent Mode: CPAP;PSV FiO2 (%):  [30 %-40 %] 40 % Set Rate:  [16 bmp] 16 bmp Vt Set:  [560 mL] 560 mL PEEP:  [5 cmH20] 5 cmH20 Pressure Support:  [10 cmH20] 10 cmH20 Plateau Pressure:  [15 cmH20-20 cmH20] 20 cmH20  INTAKE / OUTPUT: I/O last 3 completed shifts: In: 6740.1 [I.V.:2660.1; NG/GT:2450; IV Piggyback:1630] Out: 5990 [Urine:4425; Emesis/NG output:500; Drains:365; Stool:700]  PHYSICAL EXAMINATION: General:  Ill-appearing young  man, intubated and ventilated Neuro:  Opens eyes to voice, followed commands this morning, moves all extremities, strong cough but not on command HEENT:  ET tube in place, clear oral secretions Cardiovascular:  Regular, no murmur, heart rate 110 Lungs: Coarse breath sounds bilaterally Abdomen:  Soft, benign, positive bowel sounds Musculoskeletal:  No deformity Skin:  No rash  LABS:  BMET  Recent Labs Lab 05/23/17 0207 05/24/17 0317 05/25/17 0359  NA 142 144 141  K 3.4* 3.8 4.2  CL 112* 112* 113*  CO2 22 23 18*  BUN 23* 24* 23*  CREATININE 0.86 0.76 0.61  GLUCOSE 128* 116* 124*    Electrolytes  Recent Labs Lab 05/22/17 1806 05/23/17 0207 05/24/17 0317 05/25/17 0359  CALCIUM  --  8.7* 9.0 9.0  MG 1.9 2.0 2.0 1.8  PHOS 3.3 4.0  --  4.1    CBC  Recent Labs Lab 05/22/17 0440 05/23/17 0207 05/24/17 0317  WBC 4.6 5.0 4.9  HGB 10.0* 10.4* 11.3*  HCT 31.1* 31.7* 34.3*  PLT 193 209 273    Coag's  Recent Labs Lab 05/20/17 1308 05/20/17 1900  APTT  --  34  INR 1.13 1.20    Sepsis Markers  Recent Labs Lab 05/20/17 1325 05/20/17 1900 05/20/17 2134  LATICACIDVEN 6.64* 2.3* 2.5*  PROCALCITON  --  0.20  --     ABG  Recent Labs Lab 05/20/17 1704 05/21/17  0415 05/22/17 0350  PHART 7.561* 7.535* 7.492*  PCO2ART 26.3* 26.3* 27.7*  PO2ART 96.0 219* 171*    Liver Enzymes  Recent Labs Lab 05/20/17 1308 05/20/17 1900 05/23/17 0207  AST 59* 44* 27  ALT <5* 21 17  ALKPHOS 140* 128* 126  BILITOT 0.8 0.7 0.8  ALBUMIN 3.0* 2.6* 2.2*    Cardiac Enzymes  Recent Labs Lab 05/20/17 1900  TROPONINI <0.03    Glucose  Recent Labs Lab 05/24/17 1109 05/24/17 1549 05/24/17 2034 05/25/17 0005 05/25/17 0316 05/25/17 0735  GLUCAP 120* 96 140* 135* 138* 125*    Imaging Mr Brain W Wo Contrast  Result Date: 05/25/2017 CLINICAL DATA:  Headache and seizures EXAM: MRI HEAD WITHOUT AND WITH CONTRAST TECHNIQUE: Multiplanar, multiecho pulse  sequences of the brain and surrounding structures were obtained without and with intravenous contrast. CONTRAST:  14mL MULTIHANCE GADOBENATE DIMEGLUMINE 529 MG/ML IV SOLN COMPARISON:  Head CT 05/21/2017 FINDINGS: Brain: The midline structures are normal. There is cortical diffusion restriction within the left aspect of the midbrain, both cerebellar hemispheres, both medial temporal lobes, both occipital lobes and both frontal lobes with multiple foci of the deep gray nuclei. There is associated hyperintense T2 weighted signal with some of these locations. There is a right frontal approach ventriculostomy catheter with tip near the foramina of [place]. There is subarachnoid versus leptomeningeal contrast enhancement within the above described areas of diffusion restriction. There is a small amount of enhancement along the course of the shunt catheter with intrinsic T1 shortening consistent with a small focus of blood. No intraparenchymal hematoma or chronic microhemorrhage. There is mild-to-moderate atrophy. Vascular: Major intracranial arterial and venous sinus flow voids are preserved. Skull and upper cervical spine: The visualized skull base, calvarium, upper cervical spine and extracranial soft tissues are normal. Sinuses/Orbits: No fluid levels or advanced mucosal thickening. No mastoid effusion. Normal orbits. IMPRESSION: 1. Multifocal cortical diffusion restriction involving both cerebellar hemispheres and the bilateral temporal, occipital and frontal lobes, as well as the deep gray nuclei. 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 Postictal changes could have a similar appearance, but the pattern is more typical of hypoxic ischemic injury. 2. Areas of subarachnoid or leptomeningeal contrast enhancement at the sites of diffusion restriction may be secondary to blood-brain barrier breakdown in the setting of a subacute ischemic event. In the context of HIV, however, an infectious  meningitis should also be considered. 3. Small amount of blood adjacent to the right frontal approach extraventricular drain. Electronically Signed   By: Deatra Robinson M.D.   On: 05/25/2017 06:11     STUDIES:  Head Ct 5/25 >> no acute abnormality Head Ct 5/27 >> no acute abnormality, unchanged minimal R internal capsule hypodensity, unclear significance  Head CT 5/28 >> no new changes c/w 5/27 or 5/25 MRI Brain 6/1 >> multifocal cortical diffusion restriction bilateral cerebellar, bilateral temporal occipital and frontal lobes concerning for possible hypoxic injury, areas of subarachnoid or leptomeningeal contrast enhancement most consistent with meningitis  CULTURES: Blood 5/27 >> S pneumo >> R to erythro, PCN CSF 5/27 >> cryptococcus Blood fungal 5/27 >>  Tracheal aspirate 5/27 >> bordatella bronchiseptica (R to cefazolin) >> reincubated >>   ANTIBIOTICS: Amphotericin 5/27 >>  Flucytosine 5/27 >>  Ceftriaxone 5/27 >>  Bactrim 5/27 >>  Azithromycin 5/31 (weekly) >>   SIGNIFICANT EVENTS: Urgent R frontoventriculostomy 5/28 following acute decompensation, fixed R pupil  LINES/TUBES: ETT 5/27 >>  ventric 5/28 >>   DISCUSSION: 26 year old man with HIV/AIDS,  chronic hep B, recent headaches, admitted after he was found seizing. Intubated for protection. 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 He also has a pneumococcal bacteremia. He excused acute decompensation on 5/28 with decorticate posturing and asymmetric pupils. Underwent emergent ventriculostomy for depressurization with improvement. Treated initially for IRIS with steroids, now being tapered off in absence of supporting findings. MRI brain pending  ASSESSMENT / PLAN:  PULMONARY A: Acute respiratory failure due to altered mental status, failure to protect airway P:   Versus poor ventilation as tolerated. Mental status is improved and he may be a candidate for extubation today or through the  weekend  CARDIOVASCULAR A:  Hypertension P:  Telemetry monitoring Hydralazine as needed  RENAL A:   Hypokalemia At risk renal injury on amphotericin, no evidence of this so far P:   Follow BMP, urine output Replace electrolytes as indicated  GASTROINTESTINAL A:   Chronic Hepatitis B, high titer greater than 5 billion copies per mL in 02/2017 SUP Diarrhea  P:   PPI Repeat Hep B viral load pending Stool infectious panel pending  HEMATOLOGIC A:   No acute issues P:  Follow CBC DVT prophylaxis  INFECTIOUS A:   HIV/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 The Bordetella has been re-incubated to ensure that this is not actually Bordetella pertussis. If he were to decline acutely ID has recommended addition of doxycycline Prophylactic Bactrim, prophylactic azithromycin as ordered Anti-retroviral medications on hold, restart when okay with ID Low suspicion now for immune reconstitution syndrome. Steroids are being weaned to off given potential deleterious effects in the setting of cryptococcal meningitis  ENDOCRINE A:   No acute issues   P:   Follow CBG  NEUROLOGIC A:   Acute encephalopathy, toxic metabolic. Also question some component of anoxic injury given MRI results Cryptococcal meningitis Seizures Fixed pupil, resolved after ventric drain placed P:   RASS goal: -1 to 0 Wean propofol as able, may be able to assess for extubation and airway protection Keppra as ordered Ventriculostomy management as per neurosurgery recommendations. Appreciate assistance   FAMILY  - Updates: Updated grandmother at bedside 6/1  - Inter-disciplinary family meet or Palliative Care meeting due by:  05/26/17   Independent CC  time 34 minutes   Levy Pupaobert Mollye Guinta, MD, PhD 05/25/2017, 10:37 AM [facility] [phone] or if no answer [phone]

## 2017-05-25 NOTE — Progress Notes (Signed)
Pharmacy Consult Note:  Electrolyte Replacement  26 yom currently on treatment for cryptococcal meningitis and strep pneumo bacteremia. Pharmacy consulted by ID to manage electrolyte replacement. SCr stable wnl.   K improved to 4.2 on BID KCl + K+ in IVF Mag down to 1.8  Plan: Reduce KCl to 40meq daily Suppplement 2gm Mg IV x 1 Monitor K and Mag closely and order additional replacement prn  Lysle Pearlachel Hoby Kawai, PharmD, BCPS 05/25/2017 8:05 AM

## 2017-05-25 NOTE — Progress Notes (Signed)
Stopped by to visit with pt's grandmother and mom in rm. Provided spiritual/emotional support and prayer, which grandmother really appreciated. Chaplain available for f/u.    05/25/17 1200  Clinical Encounter Type  Visited With Patient and family together  Visit Type Initial;Psychological support;Spiritual support;Social support  Referral From Chaplain  Spiritual Encounters  Spiritual Needs Prayer;Emotional  Stress Factors  Patient Stress Factors Health changes  Family Stress Factors Family relationships;Health changes;Loss of control   Ephraim Hamburgerynthia A Asharia Lotter, [address]haplain

## 2017-05-25 NOTE — Progress Notes (Signed)
[facility]    Date of Admission:  05/20/2017   Total days of antibiotics 6        Day 6 L-ampho/flucytosine        Day 6 ceftriaxone        Day 6 bactrim proph   ID: Justin Ball is a 26 y.o. male with CM with strep pneumo bacteremia and bordetella bronchiseptica PNA Principal Problem:   Seizure ([place]) Active Problems:   HIV (human immunodeficiency virus infection) ([facility])   Rash and nonspecific skin eruption   Insomnia   Bipolar disorder ([place])   Intersphincteric abscess   AIDS ([place])   Headache   Meningoencephalitis   Respiratory failure ([facility])    Subjective: Afebrile. Had MRI early this morning which shows signs of severe meningoencephalitis plus possible microinfarct.family reports him spontaneously moving arms and leg  Medications:  . acetaminophen  650 mg Oral Q24H  . [START ON 05/31/2017] azithromycin  1,200 mg Per Tube Weekly  . chlorhexidine gluconate (MEDLINE KIT)  15 mL Mouth Rinse BID  . dexamethasone  4 mg Intravenous Q12H  . diphenhydrAMINE  25 mg Oral Q24H  . flucytosine  1,500 mg Per Tube Q6H  . insulin aspart  2-6 Units Subcutaneous Q4H  . mouth rinse  15 mL Mouth Rinse 10 times per day  . potassium chloride  40 mEq Oral Daily  . sulfamethoxazole-trimethoprim  10 mL Per Tube Daily    Objective: Vital signs in last 24 hours: Temp:  [97.3 F (36.3 C)-98.6 F (37 C)] 98.3 F (36.8 C) (06/01 1200) Pulse Rate:  [86-154] 131 (06/01 1320) Resp:  [16-30] 20 (06/01 1320) BP: (114-161)/(63-107) 124/67 (06/01 1320) SpO2:  [91 %-100 %] 100 % (06/01 1320) FiO2 (%):  [30 %-40 %] 40 % (06/01 1121) Weight:  [143 lb 15.4 oz (65.3 kg)] 143 lb 15.4 oz (65.3 kg) (06/01 0500)  Physical Exam  Constitutional: reamins intubated. He appears well-developed and well-nourished. No distress.  HENT: opens eyes spontaneously Mouth/Throat: Oropharynx is clear and moist. No oropharyngeal exudate.  Cardiovascular: Normal rate, regular rhythm and normal  heart sounds. Exam reveals no gallop and no friction rub.  No murmur heard.  Pulmonary/Chest: Effort normal and breath sounds normal. No respiratory distress. He has no wheezes.  Abdominal: Soft. Bowel sounds are normal. He exhibits no distension. There is no tenderness.  Lymphadenopathy:  He has no cervical adenopathy.  Neurological: He is alert and oriented to person, place, and time.  Skin: small papules on chest ? crypto  Lab Results  Recent Labs  05/23/17 0207 05/24/17 0317 05/25/17 0359  WBC 5.0 4.9  --   HGB 10.4* 11.3*  --   HCT 31.7* 34.3*  --   NA 142 144 141  K 3.4* 3.8 4.2  CL 112* 112* 113*  CO2 22 23 18*  BUN 23* 24* 23*  CREATININE 0.86 0.76 0.61   Liver Panel  Recent Labs  05/23/17 0207  PROT 7.9  ALBUMIN 2.2*  AST 27  ALT 17  ALKPHOS 126  BILITOT 0.8  BILIDIR 0.3  IBILI 0.5   Sedimentation Rate No results for input(s): ESRSEDRATE in the last 72 hours. C-Reactive Protein No results for input(s): CRP in the last 72 hours.  Microbiology: reviewed Studies/Results: [name] Wo Contrast  Result Date: 05/25/2017 CLINICAL DATA:  Headache and seizures EXAM: MRI HEAD WITHOUT AND WITH CONTRAST TECHNIQUE: Multiplanar, multiecho pulse sequences of the brain and surrounding structures were obtained without  and with intravenous contrast. CONTRAST:  74m MULTIHANCE GADOBENATE DIMEGLUMINE 529 MG/ML IV SOLN COMPARISON:  Head CT 05/21/2017 FINDINGS: Brain: The midline structures are normal. There is cortical diffusion restriction within the left aspect of the midbrain, both cerebellar hemispheres, both medial temporal lobes, both occipital lobes and both frontal lobes with multiple foci of the deep gray nuclei. There is associated hyperintense T2 weighted signal with some of these locations. There is a right frontal approach ventriculostomy catheter with tip near the foramina of [place]. There is subarachnoid versus leptomeningeal contrast enhancement within the above  described areas of diffusion restriction. There is a small amount of enhancement along the course of the shunt catheter with intrinsic T1 shortening consistent with a small focus of blood. No intraparenchymal hematoma or chronic microhemorrhage. There is mild-to-moderate atrophy. Vascular: Major intracranial arterial and venous sinus flow voids are preserved. Skull and upper cervical spine: The visualized skull base, calvarium, upper cervical spine and extracranial soft tissues are normal. Sinuses/Orbits: No fluid levels or advanced mucosal thickening. No mastoid effusion. Normal orbits. IMPRESSION: 1. Multifocal cortical diffusion restriction involving both cerebellar hemispheres and the bilateral temporal, occipital and frontal lobes, as well as the deep gray nuclei. 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 Postictal changes could have a similar appearance, but the pattern is more typical of hypoxic ischemic injury. 2. Areas of subarachnoid or leptomeningeal contrast enhancement at the sites of diffusion restriction may be secondary to blood-brain barrier breakdown in the setting of a subacute ischemic event. In the context of HIV, however, an infectious meningitis should also be considered. 3. Small amount of blood adjacent to the right frontal approach extraventricular drain. Electronically Signed   By: KUlyses JarredM.D.   On: 05/25/2017 06:11   Dg Chest Port 1 View  Result Date: 05/24/2017 CLINICAL DATA:  Acute respiratory failure, seizure activity, intubated patient. EXAM: PORTABLE CHEST 1 VIEW COMPARISON:  Portable chest x-ray of May 23, 2017 FINDINGS: The lungs are adequately inflated. There is persistent increased density medially in the left lower lung. The right base has cleared. The heart and pulmonary vascularity are normal. The endotracheal tube tip lies 7.4 cm above the carina. The esophagogastric tube tip and proximal port project below the inferior margin of the  image. IMPRESSION: Persistent medial left basilar atelectasis or pneumonia, possibly related aspiration. High positioning of the endotracheal tube. Advancement by 3-4 cm is recommended Electronically Signed   By: David  JMartiniqueM.D.   On: 05/24/2017 07:42     Assessment/Plan: Cryptococcal meningitis = continue with L-ampho and flucytosine. Pharmacy repleting electrolyte losses. 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 We will start doxy treat for 5-7d. Droplet can d/c once 5 days of treatment in place  oi proph = continue with daily bactrim and weekly azithromycin  hiv = hold on art while on CM treatment, for at least 5 wk  Chronic hep b = has high viral load but AST/ALt are normal. No need to treat at this time.    SBaxter FlatteryCBrookside Surgery Centerfor Infectious Diseases Cell: [phone]: [phone]  05/25/2017, 2:16 PM

## 2017-05-25 NOTE — Progress Notes (Signed)
Received call from nursing that EVD bag was unable to be removed for changing and it was near full. After attempt at removal of EVD bag, I was unsuccessful. I replaced unit. No complications. CSF continued to drain after changing.

## 2017-05-26 ENCOUNTER — Inpatient Hospital Stay (HOSPITAL_COMMUNITY): Payer: No Typology Code available for payment source

## 2017-05-26 DIAGNOSIS — R197 Diarrhea, unspecified: Secondary | ICD-10-CM

## 2017-05-26 LAB — GLUCOSE, CAPILLARY
GLUCOSE-CAPILLARY: 108 mg/dL — AB (ref 65–99)
GLUCOSE-CAPILLARY: 116 mg/dL — AB (ref 65–99)
GLUCOSE-CAPILLARY: 122 mg/dL — AB (ref 65–99)
Glucose-Capillary: 126 mg/dL — ABNORMAL HIGH (ref 65–99)
Glucose-Capillary: 126 mg/dL — ABNORMAL HIGH (ref 65–99)
Glucose-Capillary: 136 mg/dL — ABNORMAL HIGH (ref 65–99)
Glucose-Capillary: 113 mg/dL — ABNORMAL HIGH (ref 65–99)

## 2017-05-26 LAB — BASIC METABOLIC PANEL
ANION GAP: 8 (ref 5–15)
BUN: 22 mg/dL — AB (ref 6–20)
CALCIUM: 9.3 mg/dL (ref 8.9–10.3)
CO2: 21 mmol/L — ABNORMAL LOW (ref 22–32)
Chloride: 111 mmol/L (ref 101–111)
Creatinine, Ser: 0.77 mg/dL (ref 0.61–1.24)
GFR calc Af Amer: >60 mL/min (ref >60)
GLUCOSE: 137 mg/dL — AB (ref 65–99)
Potassium: 4 mmol/L (ref 3.5–5.1)
Sodium: 140 mmol/L (ref 135–145)

## 2017-05-26 LAB — MAGNESIUM: MAGNESIUM: 2.1 mg/dL (ref 1.7–2.4)

## 2017-05-26 LAB — POCT I-STAT 3, ART BLOOD GAS (G3+)
ACID-BASE DEFICIT: 4 mmol/L — AB (ref 0.0–2.0)
BICARBONATE: 20 mmol/L (ref 20.0–28.0)
O2 Saturation: 98 %
PO2 ART: 93 mmHg (ref 83.0–108.0)
TCO2: 21 mmol/L (ref 0–100)
pCO2 arterial: 30.5 mmHg — ABNORMAL LOW (ref 32.0–48.0)
pH, Arterial: 7.422 (ref 7.350–7.450)

## 2017-05-26 LAB — CULTURE, RESPIRATORY W GRAM STAIN

## 2017-05-26 LAB — TRIGLYCERIDES: TRIGLYCERIDES: 150 mg/dL — AB (ref <150)

## 2017-05-26 MED ORDER — DOXYCYCLINE HYCLATE 100 MG IV SOLR
100.0000 mg | Freq: Two times a day (BID) | INTRAVENOUS | Status: DC
  Administered 2017-05-26 – 2017-06-01 (×13): 100 mg via INTRAVENOUS
  Filled 2017-05-26 (×13): qty 100

## 2017-05-26 MED ORDER — FENTANYL CITRATE (PF) 100 MCG/2ML IJ SOLN
INTRAMUSCULAR | Status: AC
  Filled 2017-05-26: qty 4

## 2017-05-26 MED ORDER — METOPROLOL TARTRATE 5 MG/5ML IV SOLN
INTRAVENOUS | Status: AC
  Filled 2017-05-26: qty 5

## 2017-05-26 MED ORDER — METOPROLOL TARTRATE 5 MG/5ML IV SOLN
2.5000 mg | INTRAVENOUS | Status: DC | PRN
  Administered 2017-05-26 – 2017-05-31 (×9): 5 mg via INTRAVENOUS
  Filled 2017-05-26 (×9): qty 5

## 2017-05-26 MED ORDER — METOPROLOL TARTRATE 5 MG/5ML IV SOLN
5.0000 mg | Freq: Once | INTRAVENOUS | Status: AC
  Administered 2017-05-26: 5 mg via INTRAVENOUS

## 2017-05-26 MED ORDER — FUROSEMIDE 10 MG/ML IJ SOLN
40.0000 mg | Freq: Once | INTRAMUSCULAR | Status: AC
  Administered 2017-05-26: 40 mg via INTRAVENOUS
  Filled 2017-05-26: qty 4

## 2017-05-26 MED ORDER — MIDAZOLAM HCL 2 MG/2ML IJ SOLN
INTRAMUSCULAR | Status: AC
  Filled 2017-05-26: qty 4

## 2017-05-26 NOTE — Progress Notes (Signed)
RT called to bedside to assess ETT due to audible leak. RN and RT had added air to cuff a few times during the shift. Cuff pressures upon arrival were in the 90's. RT deflated cuff and advanced ETT back to 24 at lip. Was noted to be at 22 upon arrival. Added more air and checked cuff pressures. Pressures reading around 32 now. Pt has very strong cough and audible leak still heard intermittently at this time. Awaiting call from Elink to advise if the tube should be changed out or not. All intubation equipment at the bedside in the event that the tube needs to be replaced.

## 2017-05-26 NOTE — Progress Notes (Signed)
PULMONARY / CRITICAL CARE MEDICINE   Name: Justin Ball MRN: [id number] DOB: September 29, 1991    ADMISSION DATE:  05/20/2017  CHIEF COMPLAINT:  Seizures, altered MS.   brief Patient has been having headache for past few days. He was seen in ER 2 days ago and discharged. Today he had witnessed seizures by family and repeat in ER. When brought in to ER, he was post ictal but did follow some commands. Then he had repeat seizures and was intubated. He had disconjugate eyes which resolved.   He has also been having fevers lately. Grandmother expressed concern of his compliance with HIV meds.   26 year old male with history ofHIV AIDS with history of noncompliance here with altered mental status. History is limited as patient was pulled out of his car unresponsive. Family did report that he thought the patient had a seizure. On arrival, patient is postictal and unable to provide history.  Per review of records, patient was seen 3 days ago in the ER for headache. He was afebrile and otherwise very well-appearing at that time. He had a CT scan which was negative and sent home with supportive care.    SUBJECTIVE/OVERNIGHT/INTERVAL HX 6/1 - propofol at 30 Followed commands for me today! MRI performed this morning as below    SUBJECTIVE/OVERNIGHT/INTERVAL HX 6/2 - agitated on wua. But did follow commands. On diprivan. Sinus tachy HR 150 and improved with lopressor  VITAL SIGNS: BP 140/87   Pulse (!) 146   Temp 99.3 F (37.4 C) (Axillary)   Resp (!) 27   Ht 6\' 1"  (1.854 m)   Wt 66.2 kg (145 lb 15.1 oz)   SpO2 100%   BMI 19.26 kg/m   HEMODYNAMICS:    VENTILATOR SETTINGS: Vent Mode: PSV;CPAP FiO2 (%):  [30 %] 30 % Set Rate:  [16 bmp] 16 bmp Vt Set:  [560 mL] 560 mL PEEP:  [5 cmH20] 5 cmH20 Pressure Support:  [5 cmH20] 5 cmH20 Plateau Pressure:  [18 cmH20-22 cmH20] 19 cmH20  INTAKE / OUTPUT: I/O last 3 completed shifts: In: 7694.3 [I.V.:2633.6; NG/GT:2480.7; IV  Piggyback:2580] Out: 4317 [Urine:3425; Emesis/NG output:500; Drains:392]  PHYSICAL EXAMINATION:  General Appearance:    Looks criticall ill   Head:    Normocephalic, without obvious abnormality, atraumatic  Eyes:    PERRL - yes, conjunctiva/corneas - clear      Ears:    Normal external ear canals, both ears  Nose:   NG tube - no  Throat:  ETT TUBE - yes , OG tube - yes  Neck:   Supple,  No enlargement/tenderness/nodules     Lungs:     Clear to auscultation bilaterally, Ventilator   Synchrony - yes  Chest wall:    No deformity  Heart:    S1 and S2 normal, no murmur, CVP - no.  Pressors - no  Abdomen:     Soft, no masses, no organomegaly  Genitalia:    Not done  Rectal:   not done  Extremities:   Extremities- no cyanosis . No edema     Skin:   Intact in exposed areas .      Neurologic:   Sedation - gtt -> RASS - -3 (did follow commands to RN but got agitated as well)     PULMONARY  Recent Labs Lab 05/20/17 1704 05/21/17 0415 05/22/17 0350  PHART 7.561* 7.535* 7.492*  PCO2ART 26.3* 26.3* 27.7*  PO2ART 96.0 219* 171*  HCO3 23.7 22.1 21.0  TCO2 25  --   --  O2SAT 99.0 99.5 99.3    CBC  Recent Labs Lab 05/22/17 0440 05/23/17 0207 05/24/17 0317  HGB 10.0* 10.4* 11.3*  HCT 31.1* 31.7* 34.3*  WBC 4.6 5.0 4.9  PLT 193 209 273    COAGULATION  Recent Labs Lab 05/20/17 1308 05/20/17 1900  INR 1.13 1.20    CARDIAC   Recent Labs Lab 05/20/17 1900  TROPONINI <0.03   No results for input(s): PROBNP in the last 168 hours.   CHEMISTRY  Recent Labs Lab 05/21/17 1643 05/22/17 0440 05/22/17 1806 05/23/17 0207 05/24/17 0317 05/25/17 0359 05/26/17 0321  NA  --  139  --  142 144 141 140  K  --  3.8  --  3.4* 3.8 4.2 4.0  CL  --  111  --  112* 112* 113* 111  CO2  --  22  --  22 23 18* 21*  GLUCOSE  --  133*  --  128* 116* 124* 137*  BUN  --  15  --  23* 24* 23* 22*  CREATININE  --  0.80  --  0.86 0.76 0.61 0.77  CALCIUM  --  8.5*  --  8.7* 9.0 9.0  9.3  MG 1.7 1.9 1.9 2.0 2.0 1.8 2.1  PHOS 3.0 3.4 3.3 4.0  --  4.1  --    Estimated Creatinine Clearance: 131 mL/min (by C-G formula based on SCr of 0.77 mg/dL).   LIVER  Recent Labs Lab 05/20/17 1308 05/20/17 1900 05/23/17 0207  AST 59* 44* 27  ALT <5* 21 17  ALKPHOS 140* 128* 126  BILITOT 0.8 0.7 0.8  PROT 9.0* 8.1 7.9  ALBUMIN 3.0* 2.6* 2.2*  INR 1.13 1.20  --      INFECTIOUS  Recent Labs Lab 05/20/17 1325 05/20/17 1900 05/20/17 2134  LATICACIDVEN 6.64* 2.3* 2.5*  PROCALCITON  --  0.20  --      ENDOCRINE CBG (last 3)   Recent Labs  05/26/17 0416 05/26/17 0750 05/26/17 1135  GLUCAP 126* 126* 116*         IMAGING x48h  - image(s) personally visualized  -   highlighted in bold Mr Brain W Wo Contrast  Result Date: 05/25/2017 CLINICAL DATA:  Headache and seizures EXAM: MRI HEAD WITHOUT AND WITH CONTRAST TECHNIQUE: Multiplanar, multiecho pulse sequences of the brain and surrounding structures were obtained without and with intravenous contrast. CONTRAST:  14mL MULTIHANCE GADOBENATE DIMEGLUMINE 529 MG/ML IV SOLN COMPARISON:  Head CT 05/21/2017 FINDINGS: Brain: The midline structures are normal. There is cortical diffusion restriction within the left aspect of the midbrain, both cerebellar hemispheres, both medial temporal lobes, both occipital lobes and both frontal lobes with multiple foci of the deep gray nuclei. There is associated hyperintense T2 weighted signal with some of these locations. There is a right frontal approach ventriculostomy catheter with tip near the foramina of [place]. There is subarachnoid versus leptomeningeal contrast enhancement within the above described areas of diffusion restriction. There is a small amount of enhancement along the course of the shunt catheter with intrinsic T1 shortening consistent with a small focus of blood. No intraparenchymal hematoma or chronic microhemorrhage. There is mild-to-moderate atrophy. Vascular: Major  intracranial arterial and venous sinus flow voids are preserved. Skull and upper cervical spine: The visualized skull base, calvarium, upper cervical spine and extracranial soft tissues are normal. Sinuses/Orbits: No fluid levels or advanced mucosal thickening. No mastoid effusion. Normal orbits. IMPRESSION: 1. Multifocal cortical diffusion restriction involving both cerebellar hemispheres and the bilateral temporal,  occipital and frontal lobes, as well as the deep gray nuclei. 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 Postictal changes could have a similar appearance, but the pattern is more typical of hypoxic ischemic injury. 2. Areas of subarachnoid or leptomeningeal contrast enhancement at the sites of diffusion restriction may be secondary to blood-brain barrier breakdown in the setting of a subacute ischemic event. In the context of HIV, however, an infectious meningitis should also be considered. 3. Small amount of blood adjacent to the right frontal approach extraventricular drain. Electronically Signed   By: Deatra Robinson M.D.   On: 05/25/2017 06:11   Dg Chest Port 1 View  Result Date: 05/26/2017 CLINICAL DATA:  Acute respiratory failure. EXAM: PORTABLE CHEST 1 VIEW COMPARISON:  05/25/2017 and prior radiograph FINDINGS: The cardiomediastinal silhouette is unremarkable. An endotracheal tube with tip 3.1 cm above the carina and NG tube entering the stomach with tip off the field of view again noted. There is no evidence of focal airspace disease, pulmonary edema, suspicious pulmonary nodule/mass, pleural effusion, or pneumothorax. No acute bony abnormalities are identified. IMPRESSION: Support apparatus as described.  No significant change. Electronically Signed   By: Harmon Pier M.D.   On: 05/26/2017 07:08   Dg Chest Port 1 View  Result Date: 05/25/2017 CLINICAL DATA:  Acute respiratory failure EXAM: PORTABLE CHEST 1 VIEW COMPARISON:  Yesterday FINDINGS: Endotracheal tube  tip is just below the clavicular heads. An orogastric tube reaches the stomach. The lungs are clear. Normal heart size. No osseous findings. IMPRESSION: 1. Unremarkable positioning of endotracheal and orogastric tubes. 2. Lungs remain clear. Electronically Signed   By: Marnee Spring M.D.   On: 05/25/2017 16:17        STUDIES:  Head Ct 5/25 >> no acute abnormality Head Ct 5/27 >> no acute abnormality, unchanged minimal R internal capsule hypodensity, unclear significance  Head CT 5/28 >> no new changes c/w 5/27 or 5/25 MRI Brain 6/1 >> multifocal cortical diffusion restriction bilateral cerebellar, bilateral temporal occipital and frontal lobes concerning for possible hypoxic injury, areas of subarachnoid or leptomeningeal contrast enhancement most consistent with meningitis  CULTURES: Blood 5/27 >> S pneumo >> R to erythro, PCN CSF 5/27 >> cryptococcus Blood fungal 5/27 >>  Tracheal aspirate 5/27 >> bordatella bronchiseptica (R to cefazolin) >> reincubated >>   ANTIBIOTICS: Amphotericin 5/27 >>  Flucytosine 5/27 >>  Ceftriaxone 5/27 >>  Bactrim 5/27 >>  Azithromycin 5/31 (weekly) >>   SIGNIFICANT EVENTS: Urgent R frontoventriculostomy 5/28 following acute decompensation, fixed R pupil  LINES/TUBES: ETT 5/27 >>  ventric 5/28 >>   DISCUSSION: 26 year old man with HIV/AIDS, chronic hep B, recent headaches, admitted after he was found seizing. Intubated for protection. 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 He also has a pneumococcal bacteremia. He excused acute decompensation on 5/28 with decorticate posturing and asymmetric pupils. Underwent emergent ventriculostomy for depressurization with improvement. Treated initially for IRIS with steroids, now being tapered off in absence of supporting findings. MRI brain pending  ASSESSMENT / PLAN:  PULMONARY A: Acute respiratory failure due to altered mental status, failure to protect airway  05/26/2017 - > does NOT  meet  criteria for SBT/Extubation in setting of Acute Respiratory Failure due to agitation thoiugh improved   P:   PRVC PSV as tolerated No extubation 05/26/2017   CARDIOVASCULAR A:  Hypertension  05/26/2017 -> NOT on Pressors as of 05/26/2017. Sinus tach needing lopressors   P:  Telemetry monitoring DC Hydralazine as needed due to tachycardia Do  lopressor prn which seems to help  RENAL A:   At risk renal injury on amphotericin, no evidence of this so far P:   Follow BMP, urine output Replace electrolytes as indicated  GASTROINTESTINAL A:   Chronic Hepatitis B, high titer greater than 5 billion copies per mL in 02/2017 SUP Diarrhea   05/26/2017 - polymicrobial stool  PCR. ID does not think true pathogen d  P:   PPI Repeat Hep B viral load pending Per ID   HEMATOLOGIC A:   No acute issues P:  Follow CBC DVT prophylaxis  INFECTIOUS A:   HIV/AIDS Possible immune reconstitution syndrome, doubt at this point given elevated viral load and leukopenia Cryptococcal meningitis Pneumococcal bacteremia Chronic hepatitis B Bordetella bronchiseptica bronchitis/pneumonia   05/26/2017 - afebrile  P:   Per ID  ENDOCRINE A:   No acute issues   P:   Follow CBG  NEUROLOGIC A:   Acute encephalopathy, toxic metabolic. Also question some component of anoxic injury given MRI results Cryptococcal meningitis Seizures Fixed pupil, resolved after ventric drain placed  05/26/2017 - slowly improving  P:   RASS goal: -1 to 0 Wean propofol as able, may be able to assess for extubation and airway protection Keppra as ordered Ventriculostomy management as per neurosurgery recommendations. Appreciate assistance   FAMILY  - Updates: Updated grandmother at bedside 6/1. None at bedside 05/26/2017   - Inter-disciplinary family meet or Palliative Care meeting due by:  05/26/17       The patient is critically ill with multiple organ systems failure and requires high complexity  decision making for assessment and support, frequent evaluation and titration of therapies, application of advanced monitoring technologies and extensive interpretation of multiple databases.   Critical Care Time devoted to patient care services described in this note is  30  Minutes. This time reflects time of care of this signee Dr Kalman Shan. This critical care time does not reflect procedure time, or teaching time or supervisory time of PA/NP/Med student/Med Resident etc but could involve care discussion time    Dr. Kalman Shan, M.D., [facility].C.P Pulmonary and Critical Care Medicine Staff Physician  System [facility] Pager: [phone], If no answer or between  15:00h - 7:00h: call [phone]  05/26/2017 12:52 PM

## 2017-05-26 NOTE — Progress Notes (Signed)
RT placed Pt on wean 5/5 at 0904. [name] HR was 133 when the wean was started. The [name] HR remained the same or lower after the Pt was put on wean. RT will continue to monitor

## 2017-05-26 NOTE — Procedures (Signed)
Extubation Procedure Note  Patient Details:   Name: Ola SpurrLinston R Coyne DOB: 02/10/1991 MRN: [id number]   Airway Documentation:  Airway 7.5 mm (Active)  Secured at (cm) 24 cm 05/26/2017  2:55 PM  Measured From Lips 05/26/2017  2:55 PM  Secured Location Center 05/26/2017  2:55 PM  Secured By [employer] Tube Holder 05/26/2017  2:55 PM  Tube Holder Repositioned Yes 05/26/2017  2:55 PM  Cuff Pressure (cm H2O) 26 cm H2O 05/26/2017 12:13 PM  Site Condition Dry 05/26/2017  2:55 PM    Evaluation  O2 sats: stable throughout Complications: No apparent complications Patient did tolerate procedure well. Bilateral Breath Sounds: Clear   No  Azucena Freedlysha M Chiyoko Torrico 05/26/2017, 8:21 PM   Patient extubated to 4L nasal cannula, but will not follow commands following extubation. Verbal order received from Dr. Donnamae JudeLo Verde to extubate.   RT will continue to monitor.

## 2017-05-26 NOTE — Progress Notes (Signed)
[facility] for Infectious Disease    Date of Admission:  05/20/2017   Total days of antibiotics 7        Day 7 L-ampho/flucytosine        Day 7 ceftriaxone/doxycycline 2        Day 7 bactrim proph   ID: Justin Ball is a 26 y.o. male with CM with strep pneumo bacteremia and bordetella bronchiseptica PNA Principal Problem:   Seizure ([place]) Active Problems:   HIV (human immunodeficiency virus infection) ([facility])   Rash and nonspecific skin eruption   Insomnia   Bipolar disorder ([place])   Intersphincteric abscess   AIDS ([place])   Headache   Meningoencephalitis   Respiratory failure ([facility])    Subjective: Afebrile. Having episodes of sinus tach HR 130s. Has been weaning FiQ2 remains at 30%. He followed commands briefly for nurse and myself. Raising right arm, squeezing hand. Appears to move right arm more so than left  Medications:  . acetaminophen  650 mg Oral Q24H  . [START ON 05/31/2017] azithromycin  1,200 mg Per Tube Weekly  . chlorhexidine gluconate (MEDLINE KIT)  15 mL Mouth Rinse BID  . diphenhydrAMINE  25 mg Oral Q24H  . flucytosine  1,500 mg Per Tube Q6H  . insulin aspart  2-6 Units Subcutaneous Q4H  . mouth rinse  15 mL Mouth Rinse 10 times per day  . metoprolol tartrate      . metoprolol tartrate  5 mg Intravenous Once  . potassium chloride  40 mEq Oral Daily  . sulfamethoxazole-trimethoprim  10 mL Per Tube Daily    Objective: Vital signs in last 24 hours: Temp:  [98 F (36.7 C)-99.2 F (37.3 C)] 98.1 F (36.7 C) (06/02 0800) Pulse Rate:  [92-154] 110 (06/02 1000) Resp:  [15-31] 18 (06/02 1000) BP: (109-152)/(61-104) 140/87 (06/02 1206) SpO2:  [92 %-100 %] 100 % (06/02 1000) FiO2 (%):  [30 %] 30 % (06/02 1206) Weight:  [145 lb 15.1 oz (66.2 kg)] 145 lb 15.1 oz (66.2 kg) (06/02 0330)  Physical Exam  Constitutional: reamins intubated. He appears well-developed and well-nourished. No distress.  HENT: opens eyes spontaneously Mouth/Throat: Oropharynx is  clear and moist. No oropharyngeal exudate.  Cardiovascular: Normal rate, regular rhythm and normal heart sounds. Exam reveals no gallop and no friction rub.  No murmur heard.  Pulmonary/Chest: Effort normal and breath sounds normal. No respiratory distress. He has no wheezes.  Neuro = purposeful hand grip and moving right arm to command. Eyes open Skin: small papules on chest ? crypto  Lab Results  Recent Labs  05/24/17 0317 05/25/17 0359 05/26/17 0321  WBC 4.9  --   --   HGB 11.3*  --   --   HCT 34.3*  --   --   NA 144 141 140  K 3.8 4.2 4.0  CL 112* 113* 111  CO2 23 18* 21*  BUN 24* 23* 22*  CREATININE 0.76 0.61 0.77     Microbiology: reviewed Studies/Results: Mr Justin Ball Wo Contrast  Result Date: 05/25/2017 CLINICAL DATA:  Headache and seizures EXAM: MRI HEAD WITHOUT AND WITH CONTRAST TECHNIQUE: Multiplanar, multiecho pulse sequences of the brain and surrounding structures were obtained without and with intravenous contrast. CONTRAST:  76m MULTIHANCE GADOBENATE DIMEGLUMINE 529 MG/ML IV SOLN COMPARISON:  Head CT 05/21/2017 FINDINGS: Brain: The midline structures are normal. There is cortical diffusion restriction within the left aspect of the midbrain, both cerebellar hemispheres, both medial temporal lobes, both occipital lobes and  both frontal lobes with multiple foci of the deep gray nuclei. There is associated hyperintense T2 weighted signal with some of these locations. There is a right frontal approach ventriculostomy catheter with tip near the foramina of [place]. There is subarachnoid versus leptomeningeal contrast enhancement within the above described areas of diffusion restriction. There is a small amount of enhancement along the course of the shunt catheter with intrinsic T1 shortening consistent with a small focus of blood. No intraparenchymal hematoma or chronic microhemorrhage. There is mild-to-moderate atrophy. Vascular: Major intracranial arterial and venous sinus flow  voids are preserved. Skull and upper cervical spine: The visualized skull base, calvarium, upper cervical spine and extracranial soft tissues are normal. Sinuses/Orbits: No fluid levels or advanced mucosal thickening. No mastoid effusion. Normal orbits. IMPRESSION: 1. Multifocal cortical diffusion restriction involving both cerebellar hemispheres and the bilateral temporal, occipital and frontal lobes, as well as the deep gray nuclei. 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 Postictal changes could have a similar appearance, but the pattern is more typical of hypoxic ischemic injury. 2. Areas of subarachnoid or leptomeningeal contrast enhancement at the sites of diffusion restriction may be secondary to blood-brain barrier breakdown in the setting of a subacute ischemic event. In the context of HIV, however, an infectious meningitis should also be considered. 3. Small amount of blood adjacent to the right frontal approach extraventricular drain. Electronically Signed   By: Justin Ball M.D.   On: 05/25/2017 06:11   Dg Chest Port 1 View  Result Date: 05/26/2017 CLINICAL DATA:  Acute respiratory failure. EXAM: PORTABLE CHEST 1 VIEW COMPARISON:  05/25/2017 and prior radiograph FINDINGS: The cardiomediastinal silhouette is unremarkable. An endotracheal tube with tip 3.1 cm above the carina and NG tube entering the stomach with tip off the field of view again noted. There is no evidence of focal airspace disease, pulmonary edema, suspicious pulmonary nodule/mass, pleural effusion, or pneumothorax. No acute bony abnormalities are identified. IMPRESSION: Support apparatus as described.  No significant change. Electronically Signed   By: Justin Ball M.D.   On: 05/26/2017 07:08   Dg Chest Port 1 View  Result Date: 05/25/2017 CLINICAL DATA:  Acute respiratory failure EXAM: PORTABLE CHEST 1 VIEW COMPARISON:  Yesterday FINDINGS: Endotracheal tube tip is just below the clavicular heads. An  orogastric tube reaches the stomach. The lungs are clear. Normal heart size. No osseous findings. IMPRESSION: 1. Unremarkable positioning of endotracheal and orogastric tubes. 2. Lungs remain clear. Electronically Signed   By: Monte Fantasia M.D.   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 Pharmacy repleting electrolyte losses. 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 Diarrhea started while hospitalized so doubt he contracted nosocomial infection. I would not expand any of his current treatment. If still having perfuse diarrhea, I may consider checking giardia EIA, less sensitive but would be more consistent for assessing clinical disease  Strep pneumoniae bacteremia = plan to treat for 14d with IV ceftriaxone 2gm IV daily  Bordetella pneumonia = there are case reports of pna in hiv patients, it can be both pathogenic in certain immunocompromised host. We will start doxy treat for 5-7d. Droplet can d/c once 5 days  of treatment in place  oi proph = continue with daily bactrim and weekly azithromycin  hiv = hold on art while on CM treatment, for at least 5 wk  Chronic hep b = has high viral load but AST/ALt are normal. No need to treat at this time.    Baxter Flattery [facility] Cell: [phone] Pager: [phone]  05/26/2017, 12:17 PM

## 2017-05-26 NOTE — Progress Notes (Signed)
EVD remains functional  More active this am moving extremities Continue drainage 

## 2017-05-26 NOTE — Progress Notes (Signed)
eLink Physician-Brief Progress Note Patient Name: Justin Ball DOB: 01/04/1991 MRN: [id number]   Date of Service  05/26/2017  HPI/Events of Note  Intermittent ETT cuff leak all day. Now ventilating and saturating OK. Sat = 99%  eICU Interventions  Will ask PCCM fellow to evaluate the patient at bedside when he comes in at 7 PM.     Intervention Category Major Interventions: Other:  Lenell AntuSommer,Steven Eugene 05/26/2017, 6:36 PM

## 2017-05-26 NOTE — Progress Notes (Signed)
[name] ET tube was at 21 at the lip. RT advanced the ET Tube to 24 at the lip. The ET Tube slid out again to 22 at the lip due to moisture. RT cahnged the ET tube, moved the ET Tube and deflated and inflated the cuff. The cuff may have a leak. RT will continue to monitor

## 2017-05-26 NOTE — Progress Notes (Signed)
Brief comment on GI pathogen results ---  I don't believe that this contributing to his presentation. He didn't not have diarrhea on admit. Currently on broad spectrum for his CM, strep pneumonaie plus bordetella pneumonia.  Will not treat results since may not contributing to his presentation  Will discuss with lab if can do giardia EIA testing

## 2017-05-26 NOTE — Plan of Care (Addendum)
PCCM PLAN OF CARE  Called to bedside by nursing staff and [facility]PE[facility] for persistent ETT cuff leak and tube migration.  O2 sats ok on FiO2 30%, PEEP 5  Exam: Gen: NAD, intubated Neuro: moves extremities x4, follows simple commands, EVD with clear fluid set to 10 with 0-20cc output Q1hour Heart: tachycardic, regular, no m/r/g Lungs: CTA b/l with mechanical breath sounds.  Minimal, thin secretions on suctioning, good cough.   Plan:  - PS 0/5 x 20 min with good tidal volumes, no desats no tachycardia or HD instability  - CXR clear  - net negative for today 126  - given the re sedation and possible paralysis with a tube exchange, and his minimal vent settings and adequate respiratory status we  will perform a trial of extubation to optiflow  - Lasix 40mg  IV x1 now  - ABG in 30 min after extubation   Galvin Profferaniel Hanz Winterhalter, DO., MS [place] [facility]

## 2017-05-26 NOTE — Progress Notes (Signed)
Subjective: He was more active this am moving extremities for Neurosurgery and ID teams. Per ID he followed commands briefly for nurse and ID attending, with raising of right arm and squeezing hand moving his right arm more than his left. CSF continuing to drain at 57mHg  Objective: Current vital signs: BP (!) 142/98   Pulse (!) 141   Temp 98.8 F (37.1 C) (Axillary)   Resp (!) 29   Ht '6\' 1"'  (1.854 m)   Wt 66.2 kg (145 lb 15.1 oz)   SpO2 100%   BMI 19.26 kg/m  Vital signs in last 24 hours: Temp:  [98 F (36.7 C)-99.3 F (37.4 C)] 98.8 F (37.1 C) (06/02 1600) Pulse Rate:  [92-147] 141 (06/02 1400) Resp:  [15-29] 29 (06/02 1400) BP: (109-152)/(61-104) 142/98 (06/02 1400) SpO2:  [95 %-100 %] 100 % (06/02 1400) FiO2 (%):  [30 %] 30 % (06/02 1455) Weight:  [66.2 kg (145 lb 15.1 oz)] 66.2 kg (145 lb 15.1 oz) (06/02 0330)  Intake/Output from previous day: 06/01 0701 - 06/02 0700 In: 5448.2 [I.V.:1867.5; NG/GT:1710.7; IV Piggyback:1870] Out: 35361[Urine:3125; Drains:279] Intake/Output this shift: Total I/O In: 1136.1 [I.V.:521.1; NG/GT:455; IV Piggyback:160] Out: 1700 [Urine:1625; Drains:75] Nutritional status: Diet NPO time specified  Neurologic Exam: Ment:Eyes open, does not track or fixate on visual stimuli. Does not follow commands at time of Neurology follow up assessment and does not attempt to communicate.  CWE:RXVQMpupil 4 mm and reactive, Left pupil 3 mm and reactive. Mild esotropia noted. Face flaccidly symmetric.  Motor/Sensory:Motor examination unchanged since yesterday. Tone decreased x 4.  Reflexes:Hypoactive x 4  Lab Results: Results for orders placed or performed during the hospital encounter of 05/20/17 (from the past 48 hour(s))  Glucose, capillary     Status: Abnormal   Collection Time: 05/24/17  8:34 PM  Result Value Ref Range   Glucose-Capillary 140 (H) 65 - 99 mg/dL   Comment 1 Notify RN    Comment 2 Document in Chart   Glucose, capillary      Status: Abnormal   Collection Time: 05/25/17 12:05 AM  Result Value Ref Range   Glucose-Capillary 135 (H) 65 - 99 mg/dL   Comment 1 Notify RN    Comment 2 Document in Chart   Glucose, capillary     Status: Abnormal   Collection Time: 05/25/17  3:16 AM  Result Value Ref Range   Glucose-Capillary 138 (H) 65 - 99 mg/dL   Comment 1 Notify RN    Comment 2 Document in Chart   Basic metabolic panel     Status: Abnormal   Collection Time: 05/25/17  3:59 AM  Result Value Ref Range   Sodium 141 135 - 145 mmol/L   Potassium 4.2 3.5 - 5.1 mmol/L   Chloride 113 (H) 101 - 111 mmol/L   CO2 18 (L) 22 - 32 mmol/L   Glucose, Bld 124 (H) 65 - 99 mg/dL   BUN 23 (H) 6 - 20 mg/dL   Creatinine, Ser 0.61 0.61 - 1.24 mg/dL   Calcium 9.0 8.9 - 10.3 mg/dL   GFR calc non Af Amer >60 >60 mL/min   GFR calc Af Amer >60 >60 mL/min    Comment: (NOTE) The eGFR has been calculated using the CKD EPI equation. This calculation has not been validated in all clinical situations. eGFR's persistently <60 mL/min signify possible Chronic Kidney Disease.    Anion gap 10 5 - 15  Phosphorus     Status: None   Collection  Time: 05/25/17  3:59 AM  Result Value Ref Range   Phosphorus 4.1 2.5 - 4.6 mg/dL  Magnesium     Status: None   Collection Time: 05/25/17  3:59 AM  Result Value Ref Range   Magnesium 1.8 1.7 - 2.4 mg/dL  Glucose, capillary     Status: Abnormal   Collection Time: 05/25/17  7:35 AM  Result Value Ref Range   Glucose-Capillary 125 (H) 65 - 99 mg/dL  Glucose, capillary     Status: Abnormal   Collection Time: 05/25/17 11:35 AM  Result Value Ref Range   Glucose-Capillary 131 (H) 65 - 99 mg/dL  Glucose, capillary     Status: Abnormal   Collection Time: 05/25/17  3:49 PM  Result Value Ref Range   Glucose-Capillary 140 (H) 65 - 99 mg/dL  Glucose, capillary     Status: Abnormal   Collection Time: 05/25/17  8:57 PM  Result Value Ref Range   Glucose-Capillary 142 (H) 65 - 99 mg/dL  Glucose, capillary      Status: Abnormal   Collection Time: 05/26/17 12:42 AM  Result Value Ref Range   Glucose-Capillary 122 (H) 65 - 99 mg/dL  Basic metabolic panel     Status: Abnormal   Collection Time: 05/26/17  3:21 AM  Result Value Ref Range   Sodium 140 135 - 145 mmol/L   Potassium 4.0 3.5 - 5.1 mmol/L   Chloride 111 101 - 111 mmol/L   CO2 21 (L) 22 - 32 mmol/L   Glucose, Bld 137 (H) 65 - 99 mg/dL   BUN 22 (H) 6 - 20 mg/dL   Creatinine, Ser 0.77 0.61 - 1.24 mg/dL   Calcium 9.3 8.9 - 10.3 mg/dL   GFR calc non Af Amer >60 >60 mL/min   GFR calc Af Amer >60 >60 mL/min    Comment: (NOTE) The eGFR has been calculated using the CKD EPI equation. This calculation has not been validated in all clinical situations. eGFR's persistently <60 mL/min signify possible Chronic Kidney Disease.    Anion gap 8 5 - 15  Magnesium     Status: None   Collection Time: 05/26/17  3:21 AM  Result Value Ref Range   Magnesium 2.1 1.7 - 2.4 mg/dL  Glucose, capillary     Status: Abnormal   Collection Time: 05/26/17  4:16 AM  Result Value Ref Range   Glucose-Capillary 126 (H) 65 - 99 mg/dL  Glucose, capillary     Status: Abnormal   Collection Time: 05/26/17  7:50 AM  Result Value Ref Range   Glucose-Capillary 126 (H) 65 - 99 mg/dL  Glucose, capillary     Status: Abnormal   Collection Time: 05/26/17 11:35 AM  Result Value Ref Range   Glucose-Capillary 116 (H) 65 - 99 mg/dL  Glucose, capillary     Status: Abnormal   Collection Time: 05/26/17  4:06 PM  Result Value Ref Range   Glucose-Capillary 136 (H) 65 - 99 mg/dL    Recent Results (from the past 240 hour(s))  Blood Culture (routine x 2)     Status: Abnormal   Collection Time: 05/20/17 12:55 PM  Result Value Ref Range Status   Specimen Description BLOOD RIGHT ANTECUBITAL  Final   Special Requests   Final    BOTTLES DRAWN AEROBIC AND ANAEROBIC Blood Culture adequate volume   Culture  Setup Time   Final    IN BOTH AEROBIC AND ANAEROBIC BOTTLES GRAM POSITIVE COCCI  IN CHAINS CRITICAL RESULT CALLED TO, READ BACK  BY AND VERIFIED WITH: TO GABBOTT(PHARMd) BY TCLEVELAND 05/21/2017 AT 5:06AM    Culture STREPTOCOCCUS PNEUMONIAE (A)  Final   Report Status 05/23/2017 FINAL  Final   Organism ID, Bacteria STREPTOCOCCUS PNEUMONIAE  Final      Susceptibility   Streptococcus pneumoniae - MIC*    ERYTHROMYCIN 4 RESISTANT Resistant     LEVOFLOXACIN 0.5 SENSITIVE Sensitive     PENICILLIN (meningitis) 0.25 RESISTANT Resistant     PENICILLIN (non-meningitis) 0.25 SENSITIVE Sensitive     CEFTRIAXONE (non-meningitis) <=0.12 SENSITIVE Sensitive     CEFTRIAXONE (meningitis) <=0.12 SENSITIVE Sensitive     * STREPTOCOCCUS PNEUMONIAE  Fungus culture, blood     Status: None (Preliminary result)   Collection Time: 05/20/17 12:55 PM  Result Value Ref Range Status   Specimen Description BLOOD RIGHT ANTECUBITAL  Final   Special Requests   Final    BOTTLES DRAWN AEROBIC AND ANAEROBIC Blood Culture adequate volume   Culture NO GROWTH 5 DAYS  Final   Report Status PENDING  Incomplete  Blood Culture ID Panel (Reflexed)     Status: Abnormal   Collection Time: 05/20/17 12:55 PM  Result Value Ref Range Status   Enterococcus species NOT DETECTED NOT DETECTED Final   Listeria monocytogenes NOT DETECTED NOT DETECTED Final   Staphylococcus species NOT DETECTED NOT DETECTED Final   Staphylococcus aureus NOT DETECTED NOT DETECTED Final   Streptococcus species DETECTED (A) NOT DETECTED Final    Comment: CRITICAL RESULT CALLED TO, READ BACK BY AND VERIFIED WITH: TO GABBOTT(PHARMd) BY TCLEVELAND 05/21/2017 AT 5:06AM    Streptococcus agalactiae NOT DETECTED NOT DETECTED Final   Streptococcus pneumoniae DETECTED (A) NOT DETECTED Final    Comment: CRITICAL RESULT CALLED TO, READ BACK BY AND VERIFIED WITH: TO GABBOTT(PHARMd) BY TCLEVELAND 05/21/2017 AT 5:06AM    Streptococcus pyogenes NOT DETECTED NOT DETECTED Final   Acinetobacter baumannii NOT DETECTED NOT DETECTED Final    Enterobacteriaceae species NOT DETECTED NOT DETECTED Final   Enterobacter cloacae complex NOT DETECTED NOT DETECTED Final   Escherichia coli NOT DETECTED NOT DETECTED Final   Klebsiella oxytoca NOT DETECTED NOT DETECTED Final   Klebsiella pneumoniae NOT DETECTED NOT DETECTED Final   Proteus species NOT DETECTED NOT DETECTED Final   Serratia marcescens NOT DETECTED NOT DETECTED Final   Haemophilus influenzae NOT DETECTED NOT DETECTED Final   Neisseria meningitidis NOT DETECTED NOT DETECTED Final   Pseudomonas aeruginosa NOT DETECTED NOT DETECTED Final   Candida albicans NOT DETECTED NOT DETECTED Final   Candida glabrata NOT DETECTED NOT DETECTED Final   Candida krusei NOT DETECTED NOT DETECTED Final   Candida parapsilosis NOT DETECTED NOT DETECTED Final   Candida tropicalis NOT DETECTED NOT DETECTED Final  Blood Culture (routine x 2)     Status: None   Collection Time: 05/20/17  1:50 PM  Result Value Ref Range Status   Specimen Description BLOOD LEFT FOREARM  Final   Special Requests   Final    BOTTLES DRAWN AEROBIC AND ANAEROBIC Blood Culture results may not be optimal due to an inadequate volume of blood received in culture bottles   Culture NO GROWTH 5 DAYS  Final   Report Status 05/25/2017 FINAL  Final  CSF culture     Status: Abnormal   Collection Time: 05/20/17  4:10 PM  Result Value Ref Range Status   Specimen Description CSF TUBE 2  Final   Special Requests Immunocompromised  Final   Gram Stain   Final    WBC PRESENT, PREDOMINANTLY  MONONUCLEAR YEAST CYTOSPIN SMEAR CRITICAL RESULT CALLED TO, READ BACK BY AND VERIFIED WITH: Debby Bud AT 1905 05/20/17 BY L BENFIELD    Culture CRYPTOCOCCUS NEOFORMANS (A)  Final   Report Status 05/24/2017 FINAL  Final  MRSA PCR Screening     Status: None   Collection Time: 05/20/17  6:28 PM  Result Value Ref Range Status   MRSA by PCR NEGATIVE NEGATIVE Final    Comment:        The GeneXpert MRSA Assay (FDA approved for NASAL  specimens only), is one component of a comprehensive MRSA colonization surveillance program. It is not intended to diagnose MRSA infection nor to guide or monitor treatment for MRSA infections.   Culture, respiratory (NON-Expectorated)     Status: None   Collection Time: 05/21/17 12:10 PM  Result Value Ref Range Status   Specimen Description TRACHEAL ASPIRATE  Final   Special Requests NONE  Final   Gram Stain   Final    MODERATE WBC PRESENT,BOTH PMN AND MONONUCLEAR NO ORGANISMS SEEN    Culture MODERATE BORDETELLA BRONCHISEPTICA  Final   Report Status 05/26/2017 FINAL  Final   Organism ID, Bacteria BORDETELLA BRONCHISEPTICA  Final      Susceptibility   Bordetella bronchiseptica - MIC*    CEFEPIME 16 INTERMEDIATE Intermediate     CEFAZOLIN >=64 RESISTANT Resistant     GENTAMICIN 2 SENSITIVE Sensitive     CIPROFLOXACIN 1 SENSITIVE Sensitive     IMIPENEM 0.5 SENSITIVE Sensitive     TRIMETH/SULFA 80 RESISTANT Resistant     * MODERATE BORDETELLA BRONCHISEPTICA  Culture, blood (Routine X 2) w Reflex to ID Panel     Status: None (Preliminary result)   Collection Time: 05/22/17 11:51 AM  Result Value Ref Range Status   Specimen Description BLOOD LEFT ARM  Final   Special Requests IN PEDIATRIC BOTTLE Blood Culture adequate volume  Final   Culture NO GROWTH 3 DAYS  Final   Report Status PENDING  Incomplete  Culture, blood (Routine X 2) w Reflex to ID Panel     Status: None (Preliminary result)   Collection Time: 05/22/17 11:57 AM  Result Value Ref Range Status   Specimen Description BLOOD LEFT HAND  Final   Special Requests IN PEDIATRIC BOTTLE Blood Culture adequate volume  Final   Culture NO GROWTH 3 DAYS  Final   Report Status PENDING  Incomplete  Gastrointestinal Panel by PCR , Stool     Status: Abnormal   Collection Time: 05/24/17 11:52 AM  Result Value Ref Range Status   Campylobacter species DETECTED (A) NOT DETECTED Final    Comment: RESULT CALLED TO, READ BACK BY AND  VERIFIED WITH: [name] CARMICHAEL AT 2005 ON 05/25/17 RWW    Plesimonas shigelloides NOT DETECTED NOT DETECTED Final   Salmonella species NOT DETECTED NOT DETECTED Final   Yersinia enterocolitica NOT DETECTED NOT DETECTED Final   Vibrio species NOT DETECTED NOT DETECTED Final   Vibrio cholerae NOT DETECTED NOT DETECTED Final   Enteroaggregative E coli (EAEC) NOT DETECTED NOT DETECTED Final   Enterotoxigenic E coli (ETEC) NOT DETECTED NOT DETECTED Final   Shiga like toxin producing E coli (STEC) DETECTED (A) NOT DETECTED Final    Comment: RESULT CALLED TO, READ BACK BY AND VERIFIED WITH: [name] CARMICHAEL AT 2005 ON 05/25/17 RWW    E. coli O157 NOT DETECTED NOT DETECTED Final   Shigella/Enteroinvasive E coli (EIEC) NOT DETECTED NOT DETECTED Final   Cryptosporidium NOT DETECTED NOT DETECTED Final  Cyclospora cayetanensis NOT DETECTED NOT DETECTED Final   Entamoeba histolytica NOT DETECTED NOT DETECTED Final   Giardia lamblia DETECTED (A) NOT DETECTED Final   Adenovirus F40/41 NOT DETECTED NOT DETECTED Final   Astrovirus NOT DETECTED NOT DETECTED Final   Norovirus GI/GII NOT DETECTED NOT DETECTED Final   Rotavirus A NOT DETECTED NOT DETECTED Final   Sapovirus (I, II, IV, and V) DETECTED (A) NOT DETECTED Final    Lipid Panel No results for input(s): CHOL, TRIG, HDL, CHOLHDL, VLDL, LDLCALC in the last 72 hours.  Studies/Results: [name] Wo Contrast  Result Date: 05/25/2017 CLINICAL DATA:  Headache and seizures EXAM: MRI HEAD WITHOUT AND WITH CONTRAST TECHNIQUE: Multiplanar, multiecho pulse sequences of the brain and surrounding structures were obtained without and with intravenous contrast. CONTRAST:  61m MULTIHANCE GADOBENATE DIMEGLUMINE 529 MG/ML IV SOLN COMPARISON:  Head CT 05/21/2017 FINDINGS: Brain: The midline structures are normal. There is cortical diffusion restriction within the left aspect of the midbrain, both cerebellar hemispheres, both medial temporal lobes, both  occipital lobes and both frontal lobes with multiple foci of the deep gray nuclei. There is associated hyperintense T2 weighted signal with some of these locations. There is a right frontal approach ventriculostomy catheter with tip near the foramina of [place]. There is subarachnoid versus leptomeningeal contrast enhancement within the above described areas of diffusion restriction. There is a small amount of enhancement along the course of the shunt catheter with intrinsic T1 shortening consistent with a small focus of blood. No intraparenchymal hematoma or chronic microhemorrhage. There is mild-to-moderate atrophy. Vascular: Major intracranial arterial and venous sinus flow voids are preserved. Skull and upper cervical spine: The visualized skull base, calvarium, upper cervical spine and extracranial soft tissues are normal. Sinuses/Orbits: No fluid levels or advanced mucosal thickening. No mastoid effusion. Normal orbits. IMPRESSION: 1. Multifocal cortical diffusion restriction involving both cerebellar hemispheres and the bilateral temporal, occipital and frontal lobes, as well as the deep gray nuclei. 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 Postictal changes could have a similar appearance, but the pattern is more typical of hypoxic ischemic injury. 2. Areas of subarachnoid or leptomeningeal contrast enhancement at the sites of diffusion restriction may be secondary to blood-brain barrier breakdown in the setting of a subacute ischemic event. In the context of HIV, however, an infectious meningitis should also be considered. 3. Small amount of blood adjacent to the right frontal approach extraventricular drain. Electronically Signed   By: KUlyses JarredM.D.   On: 05/25/2017 06:11   Dg Chest Port 1 View  Result Date: 05/26/2017 CLINICAL DATA:  Acute respiratory failure. EXAM: PORTABLE CHEST 1 VIEW COMPARISON:  05/25/2017 and prior radiograph FINDINGS: The cardiomediastinal  silhouette is unremarkable. An endotracheal tube with tip 3.1 cm above the carina and NG tube entering the stomach with tip off the field of view again noted. There is no evidence of focal airspace disease, pulmonary edema, suspicious pulmonary nodule/mass, pleural effusion, or pneumothorax. No acute bony abnormalities are identified. IMPRESSION: Support apparatus as described.  No significant change. Electronically Signed   By: JMargarette CanadaM.D.   On: 05/26/2017 07:08   Dg Chest Port 1 View  Result Date: 05/25/2017 CLINICAL DATA:  Acute respiratory failure EXAM: PORTABLE CHEST 1 VIEW COMPARISON:  Yesterday FINDINGS: Endotracheal tube tip is just below the clavicular heads. An orogastric tube reaches the stomach. The lungs are clear. Normal heart size. No osseous findings. IMPRESSION: 1. Unremarkable positioning of endotracheal and orogastric tubes. 2. Lungs  remain clear. Electronically Signed   By: Monte Fantasia M.D.   On: 05/25/2017 16:17    Medications:   Current Facility-Administered Medications:  .  0.9 %  sodium chloride infusion, 250 mL, Intravenous, PRN, Sharia Reeve, MD, Last Rate: 10 mL/hr at 05/26/17 1400, 250 mL at 05/26/17 1400 .  acetaminophen (TYLENOL) tablet 650 mg, 650 mg, Oral, Q4H PRN, Sharia Reeve, MD .  acetaminophen (TYLENOL) tablet 650 mg, 650 mg, Oral, Q24H, Tommy Medal, Lavell Islam, MD, 650 mg at 05/26/17 1647 .  albuterol (PROVENTIL) (2.5 MG/3ML) 0.083% nebulizer solution 2.5 mg, 2.5 mg, Nebulization, Q2H PRN, Panchal, Amar, MD .  amphotericin B liposome (AMBISOME) 250 mg in dextrose 5 % 500 mL IVPB, 4 mg/kg, Intravenous, Q24H, Duffy Bruce, MD, Stopped at 05/25/17 2100 .  [START ON 05/31/2017] azithromycin (ZITHROMAX) 200 MG/5ML suspension 1,200 mg, 1,200 mg, Per Tube, Weekly, Carlyle Basques, MD .  cefTRIAXone (ROCEPHIN) 2 g in dextrose 5 % 50 mL IVPB, 2 g, Intravenous, Q24H, Carlyle Basques, MD, Stopped at 05/26/17 [phone] .  chlorhexidine gluconate (MEDLINE KIT) (PERIDEX)  0.12 % solution 15 mL, 15 mL, Mouth Rinse, BID, Sood, Vineet, MD, 15 mL at 05/26/17 0830 .  dextrose 5 % and 0.45 % NaCl with KCl 20 mEq/L infusion, , Intravenous, Continuous, Panchal, Amar, MD, Last Rate: 50 mL/hr at 05/26/17 1409 .  diphenhydrAMINE (BENADRYL) capsule 25 mg, 25 mg, Oral, Q24H, Tommy Medal, Lavell Islam, MD, 25 mg at 05/26/17 1646 .  docusate (COLACE) 50 MG/5ML liquid 100 mg, 100 mg, Per Tube, BID PRN, Sharia Reeve, MD .  doxycycline (VIBRAMYCIN) 100 mg in dextrose 5 % 250 mL IVPB, 100 mg, Intravenous, Q12H, Carlyle Basques, MD, Stopped at 05/26/17 1610 .  famotidine (PEPCID) IVPB 20 mg premix, 20 mg, Intravenous, Q12H, Sharia Reeve, MD, Stopped at 05/26/17 1010 .  feeding supplement (VITAL AF 1.2 CAL) liquid 1,000 mL, 1,000 mL, Per Tube, Continuous, Byrum, Rose Fillers, MD, Last Rate: 65 mL/hr at 05/26/17 1400, 1,000 mL at 05/26/17 1400 .  fentaNYL (SUBLIMAZE) injection 100 mcg, 100 mcg, Intravenous, Q15 min PRN, Duffy Bruce, MD, 100 mcg at 05/20/17 1608 .  fentaNYL (SUBLIMAZE) injection 100 mcg, 100 mcg, Intravenous, Q2H PRN, Duffy Bruce, MD, 100 mcg at 05/26/17 1427 .  fentaNYL (SUBLIMAZE) injection 100 mcg, 100 mcg, Intravenous, Q15 min PRN, Sharia Reeve, MD, 100 mcg at 05/21/17 0959 .  fentaNYL (SUBLIMAZE) injection 100 mcg, 100 mcg, Intravenous, Q2H PRN, Sharia Reeve, MD, 100 mcg at 05/25/17 0207 .  flucytosine (ANCOBON) capsule 1,500 mg, 1,500 mg, Per Tube, Q6H, Chesley Mires, MD, 1,500 mg at 05/26/17 1433 .  insulin aspart (novoLOG) injection 2-6 Units, 2-6 Units, Subcutaneous, Q4H, Sharia Reeve, MD, 2 Units at 05/26/17 1640 .  levETIRAcetam (KEPPRA) 1,000 mg in sodium chloride 0.9 % 100 mL IVPB, 1,000 mg, Intravenous, Q12H, Greta Doom, MD, Stopped at 05/26/17 [phone] .  MEDLINE mouth rinse, 15 mL, Mouth Rinse, 10 times per day, Chesley Mires, MD, 15 mL at 05/26/17 1625 .  metoprolol tartrate (LOPRESSOR) injection 2.5-5 mg, 2.5-5 mg, Intravenous, Q3H PRN, Brand Males, MD, 5 mg at 05/26/17 1429 .  ondansetron (ZOFRAN) injection 4 mg, 4 mg, Intravenous, Q6H PRN, Panchal, Amar, MD .  potassium chloride 20 MEQ/15ML (10%) solution 40 mEq, 40 mEq, Oral, Daily, Rumbarger, Valeda Malm, RPH, 40 mEq at 05/26/17 0933 .  propofol (DIPRIVAN) 1000 MG/100ML infusion, 0-50 mcg/kg/min, Intravenous, Continuous, Duffy Bruce, MD, Last Rate: 19.1 mL/hr at 05/26/17 1630, 50 mcg/kg/min at 05/26/17 1630 .  sodium chloride 0.9 % bolus 500 mL, 500 mL, Intravenous, Q24H, Tommy Medal, Lavell Islam, MD, 500 mL at 05/25/17 2122 .  sodium chloride 0.9 % bolus 500 mL, 500 mL, Intravenous, Q24H, Tommy Medal, Lavell Islam, MD, 500 mL at 05/26/17 1622 .  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 Fungal elements noted. On L-amphotericin B and flucytosine. ID following.  2. MRI from 6/1 revealed multifocal cortical diffusion restriction involving both cerebellar hemispheres and the bilateral temporal, occipital and frontal lobes, as well as the deep gray nuclei. 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 Postictal changes could, however, have a similar appearance. Supportive of strokes secondary to perivascular infection/inflammation, areas of subarachnoid or leptomeningeal contrast enhancement at the sites of diffusion restriction are also noted.  3. EEG: No seizures or epileptiform discharges.This would make postictal changes less likely as the etiology for the cortical restricted diffusion on MRI.  4. EVD. Followed by Neurosurgery.  5. Strep pneumo bacteremia. On ceftriaxone for 14 day course.  6. Bordetella pneumonia. On doxycycline for 5-7  days. On droplet precautions.   Recommendations: 1. Continue Keppra 1000 mg IV BID 2. Appreciate neurosurgery assistance. Ventricular drain in place, with clear colorless CSF drainage noted.  3. Long term EEG discontinued.  4. Continue flucytosine per tube and IV amphotericin B. ID is following.  5. Wean off sedation, as tolerated.  6. Given that there is low suspicion for IRIS, steroids have been weaned.    LOS: 6 days   '@Electronically'  signed: Dr. Kerney Elbe 05/26/2017  4:52 PM

## 2017-05-27 ENCOUNTER — Inpatient Hospital Stay (HOSPITAL_COMMUNITY): Payer: No Typology Code available for payment source

## 2017-05-27 DIAGNOSIS — G9340 Encephalopathy, unspecified: Secondary | ICD-10-CM

## 2017-05-27 DIAGNOSIS — G039 Meningitis, unspecified: Secondary | ICD-10-CM | POA: Insufficient documentation

## 2017-05-27 DIAGNOSIS — N179 Acute kidney failure, unspecified: Secondary | ICD-10-CM

## 2017-05-27 DIAGNOSIS — B451 Cerebral cryptococcosis: Secondary | ICD-10-CM | POA: Insufficient documentation

## 2017-05-27 LAB — POCT I-STAT 3, ART BLOOD GAS (G3+)
ACID-BASE DEFICIT: 2 mmol/L (ref 0.0–2.0)
BICARBONATE: 20.9 mmol/L (ref 20.0–28.0)
O2 SAT: 98 %
PO2 ART: 107 mmHg (ref 83.0–108.0)
TCO2: 22 mmol/L (ref 0–100)
pCO2 arterial: 30.7 mmHg — ABNORMAL LOW (ref 32.0–48.0)
pH, Arterial: 7.44 (ref 7.350–7.450)

## 2017-05-27 LAB — CBC WITH DIFFERENTIAL/PLATELET
BASOS ABS: 0 10*3/uL (ref 0.0–0.1)
BASOS PCT: 0 %
EOS ABS: 0.1 10*3/uL (ref 0.0–0.7)
Eosinophils Relative: 3 %
HCT: 38 % — ABNORMAL LOW (ref 39.0–52.0)
Hemoglobin: 12.2 g/dL — ABNORMAL LOW (ref 13.0–17.0)
Lymphocytes Relative: 18 %
Lymphs Abs: 0.8 10*3/uL (ref 0.7–4.0)
MCH: 29.8 pg (ref 26.0–34.0)
MCHC: 32.1 g/dL (ref 30.0–36.0)
MCV: 92.7 fL (ref 78.0–100.0)
MONO ABS: 0.6 10*3/uL (ref 0.1–1.0)
Monocytes Relative: 14 %
NEUTROS ABS: 3.1 10*3/uL (ref 1.7–7.7)
NEUTROS PCT: 65 %
PLATELETS: 326 10*3/uL (ref 150–400)
RBC: 4.1 MIL/uL — ABNORMAL LOW (ref 4.22–5.81)
RDW: 14 % (ref 11.5–15.5)
WBC: 4.6 10*3/uL (ref 4.0–10.5)

## 2017-05-27 LAB — CULTURE, BLOOD (ROUTINE X 2)
CULTURE: NO GROWTH
Culture: NO GROWTH
SPECIAL REQUESTS: ADEQUATE
SPECIAL REQUESTS: ADEQUATE

## 2017-05-27 LAB — BASIC METABOLIC PANEL
Anion gap: 10 (ref 5–15)
BUN: 30 mg/dL — ABNORMAL HIGH (ref 6–20)
CO2: 20 mmol/L — ABNORMAL LOW (ref 22–32)
Calcium: 9.5 mg/dL (ref 8.9–10.3)
Chloride: 112 mmol/L — ABNORMAL HIGH (ref 101–111)
Creatinine, Ser: 1.09 mg/dL (ref 0.61–1.24)
Glucose, Bld: 115 mg/dL — ABNORMAL HIGH (ref 65–99)
POTASSIUM: 3.9 mmol/L (ref 3.5–5.1)
SODIUM: 142 mmol/L (ref 135–145)

## 2017-05-27 LAB — GLUCOSE, CAPILLARY
GLUCOSE-CAPILLARY: 109 mg/dL — AB (ref 65–99)
GLUCOSE-CAPILLARY: 170 mg/dL — AB (ref 65–99)
GLUCOSE-CAPILLARY: 111 mg/dL — AB (ref 65–99)
Glucose-Capillary: 102 mg/dL — ABNORMAL HIGH (ref 65–99)
Glucose-Capillary: 116 mg/dL — ABNORMAL HIGH (ref 65–99)
Glucose-Capillary: 136 mg/dL — ABNORMAL HIGH (ref 65–99)

## 2017-05-27 LAB — FUNGUS CULTURE, BLOOD
Culture: NO GROWTH
Special Requests: ADEQUATE

## 2017-05-27 LAB — LACTIC ACID, PLASMA: LACTIC ACID, VENOUS: 0.9 mmol/L (ref 0.5–1.9)

## 2017-05-27 LAB — PHOSPHORUS: PHOSPHORUS: 6.5 mg/dL — AB (ref 2.5–4.6)

## 2017-05-27 LAB — MAGNESIUM: MAGNESIUM: 1.9 mg/dL (ref 1.7–2.4)

## 2017-05-27 MED ORDER — SODIUM CHLORIDE 0.9 % IV BOLUS (SEPSIS): 500.0000 mL | INTRAVENOUS | Status: DC

## 2017-05-27 MED ORDER — METOPROLOL TARTRATE 5 MG/5ML IV SOLN
5.0000 mg | Freq: Three times a day (TID) | INTRAVENOUS | Status: DC
  Administered 2017-05-27 – 2017-06-22 (×71): 5 mg via INTRAVENOUS
  Filled 2017-05-27 (×77): qty 5

## 2017-05-27 MED ORDER — SODIUM CHLORIDE 0.9% FLUSH: 10.0000 mL | INTRAVENOUS | Status: DC | PRN

## 2017-05-27 MED ORDER — SODIUM CHLORIDE 0.9 % IV BOLUS (SEPSIS)
500.0000 mL | INTRAVENOUS | Status: DC
  Administered 2017-05-27: 500 mL via INTRAVENOUS

## 2017-05-27 MED ORDER — SODIUM CHLORIDE 0.9 % IV BOLUS (SEPSIS): 1000.0000 mL | INTRAVENOUS | Status: DC

## 2017-05-27 MED ORDER — CHLORHEXIDINE GLUCONATE CLOTH 2 % EX PADS
6.0000 | MEDICATED_PAD | Freq: Every day | CUTANEOUS | Status: DC
  Administered 2017-05-28 – 2017-06-25 (×24): 6 via TOPICAL

## 2017-05-27 MED ORDER — MAGNESIUM SULFATE 2 GM/50ML IV SOLN
2.0000 g | Freq: Once | INTRAVENOUS | Status: AC
Start: 2017-05-27 — End: 2017-05-27
  Administered 2017-05-27: 2 g via INTRAVENOUS
  Filled 2017-05-27: qty 50

## 2017-05-27 MED ORDER — ORAL CARE MOUTH RINSE
15.0000 mL | Freq: Two times a day (BID) | OROMUCOSAL | Status: DC
  Administered 2017-05-27 – 2017-05-29 (×7): 15 mL via OROMUCOSAL

## 2017-05-27 MED ORDER — SODIUM CHLORIDE 0.9 % IV BOLUS (SEPSIS)
1000.0000 mL | INTRAVENOUS | Status: DC
  Administered 2017-05-27: 1000 mL via INTRAVENOUS

## 2017-05-27 NOTE — Progress Notes (Signed)
EVd drain remains patent Pt extubated yesterady Continue drainage 

## 2017-05-27 NOTE — Progress Notes (Signed)
Subjective: Extubated last night. EVD drain remains patent. Intermittently following commands.   Objective: Current vital signs: BP 135/82   Pulse (!) 116   Temp 97.7 F (36.5 C) (Axillary)   Resp (!) 25   Ht 6' 1" (1.854 m)   Wt 62.8 kg (138 lb 7.2 oz)   SpO2 98%   BMI 18.27 kg/m  Vital signs in last 24 hours: Temp:  [97.7 F (36.5 C)-99.3 F (37.4 C)] 97.7 F (36.5 C) (06/03 0837) Pulse Rate:  [96-146] 116 (06/03 1000) Resp:  [16-35] 25 (06/03 1000) BP: (102-153)/(58-104) 135/82 (06/03 1000) SpO2:  [95 %-100 %] 98 % (06/03 1000) FiO2 (%):  [30 %] 30 % (06/02 1455) Weight:  [62.8 kg (138 lb 7.2 oz)] 62.8 kg (138 lb 7.2 oz) (06/03 0500)  Intake/Output from previous day: 06/02 0701 - 06/03 0700 In: 3541 [I.V.:1626.8; NG/GT:704.2; IV Piggyback:1210] Out: 4532 [Urine:4230; Drains:302] Intake/Output this shift: Total I/O In: 209.3 [I.V.:180; NG/GT:29.3] Out: 36 [Drains:36] Nutritional status: Diet NPO time specified  Neurologic Exam: Ment:Eyes open, does not track or fixate on visual stimuli. Does not follow commands at time of Neurology follow up assessment and does not attempt to communicate.  VP:XTGGY pupil 42m and reactive, Left pupil 382mand reactive. Mild esotropia noted. Face flaccidly symmetric.  Motor/Sensory:Motor examination unchanged since yesterday or the day before. Tone decreased x 4.  Reflexes:Hypoactive x 4  Lab Results: Results for orders placed or performed during the hospital encounter of 05/20/17 (from the past 48 hour(s))  Glucose, capillary     Status: Abnormal   Collection Time: 05/25/17 11:35 AM  Result Value Ref Range   Glucose-Capillary 131 (H) 65 - 99 mg/dL  Glucose, capillary     Status: Abnormal   Collection Time: 05/25/17  3:49 PM  Result Value Ref Range   Glucose-Capillary 140 (H) 65 - 99 mg/dL  Glucose, capillary     Status: Abnormal   Collection Time: 05/25/17  8:57 PM  Result Value Ref Range   Glucose-Capillary 142 (H) 65 -  99 mg/dL  Glucose, capillary     Status: Abnormal   Collection Time: 05/26/17 12:42 AM  Result Value Ref Range   Glucose-Capillary 122 (H) 65 - 99 mg/dL  Basic metabolic panel     Status: Abnormal   Collection Time: 05/26/17  3:21 AM  Result Value Ref Range   Sodium 140 135 - 145 mmol/L   Potassium 4.0 3.5 - 5.1 mmol/L   Chloride 111 101 - 111 mmol/L   CO2 21 (L) 22 - 32 mmol/L   Glucose, Bld 137 (H) 65 - 99 mg/dL   BUN 22 (H) 6 - 20 mg/dL   Creatinine, Ser 0.77 0.61 - 1.24 mg/dL   Calcium 9.3 8.9 - 10.3 mg/dL   GFR calc non Af Amer >60 >60 mL/min   GFR calc Af Amer >60 >60 mL/min    Comment: (NOTE) The eGFR has been calculated using the CKD EPI equation. This calculation has not been validated in all clinical situations. eGFR's persistently <60 mL/min signify possible Chronic Kidney Disease.    Anion gap 8 5 - 15  Magnesium     Status: None   Collection Time: 05/26/17  3:21 AM  Result Value Ref Range   Magnesium 2.1 1.7 - 2.4 mg/dL  Glucose, capillary     Status: Abnormal   Collection Time: 05/26/17  4:16 AM  Result Value Ref Range   Glucose-Capillary 126 (H) 65 - 99 mg/dL  Glucose, capillary  Status: Abnormal   Collection Time: 05/26/17  7:50 AM  Result Value Ref Range   Glucose-Capillary 126 (H) 65 - 99 mg/dL  Glucose, capillary     Status: Abnormal   Collection Time: 05/26/17 11:35 AM  Result Value Ref Range   Glucose-Capillary 116 (H) 65 - 99 mg/dL  Triglycerides     Status: Abnormal   Collection Time: 05/26/17  2:07 PM  Result Value Ref Range   Triglycerides 150 (H) <150 mg/dL  Glucose, capillary     Status: Abnormal   Collection Time: 05/26/17  4:06 PM  Result Value Ref Range   Glucose-Capillary 136 (H) 65 - 99 mg/dL  Glucose, capillary     Status: Abnormal   Collection Time: 05/26/17  8:12 PM  Result Value Ref Range   Glucose-Capillary 113 (H) 65 - 99 mg/dL  I-STAT 3, arterial blood gas (G3+)     Status: Abnormal   Collection Time: 05/26/17  8:42 PM   Result Value Ref Range   pH, Arterial 7.422 7.350 - 7.450   pCO2 arterial 30.5 (L) 32.0 - 48.0 mmHg   pO2, Arterial 93.0 83.0 - 108.0 mmHg   Bicarbonate 20.0 20.0 - 28.0 mmol/L   TCO2 21 0 - 100 mmol/L   O2 Saturation 98.0 %   Acid-base deficit 4.0 (H) 0.0 - 2.0 mmol/L   Patient temperature 97.8 F    Collection site RADIAL, ALLEN'S TEST ACCEPTABLE    Drawn by RT    Sample type ARTERIAL   Glucose, capillary     Status: Abnormal   Collection Time: 05/26/17  9:28 PM  Result Value Ref Range   Glucose-Capillary 108 (H) 65 - 99 mg/dL  Glucose, capillary     Status: Abnormal   Collection Time: 05/27/17 12:04 AM  Result Value Ref Range   Glucose-Capillary 136 (H) 65 - 99 mg/dL  Lactic acid, plasma     Status: None   Collection Time: 05/27/17 12:28 AM  Result Value Ref Range   Lactic Acid, Venous 0.9 0.5 - 1.9 mmol/L  Basic metabolic panel     Status: Abnormal   Collection Time: 05/27/17 12:28 AM  Result Value Ref Range   Sodium 142 135 - 145 mmol/L   Potassium 3.9 3.5 - 5.1 mmol/L   Chloride 112 (H) 101 - 111 mmol/L   CO2 20 (L) 22 - 32 mmol/L   Glucose, Bld 115 (H) 65 - 99 mg/dL   BUN 30 (H) 6 - 20 mg/dL   Creatinine, Ser 1.09 0.61 - 1.24 mg/dL   Calcium 9.5 8.9 - 10.3 mg/dL   GFR calc non Af Amer >60 >60 mL/min   GFR calc Af Amer >60 >60 mL/min    Comment: (NOTE) The eGFR has been calculated using the CKD EPI equation. This calculation has not been validated in all clinical situations. eGFR's persistently <60 mL/min signify possible Chronic Kidney Disease.    Anion gap 10 5 - 15  Magnesium     Status: None   Collection Time: 05/27/17 12:28 AM  Result Value Ref Range   Magnesium 1.9 1.7 - 2.4 mg/dL  Phosphorus     Status: Abnormal   Collection Time: 05/27/17 12:28 AM  Result Value Ref Range   Phosphorus 6.5 (H) 2.5 - 4.6 mg/dL  CBC with Differential/Platelet     Status: Abnormal   Collection Time: 05/27/17 12:28 AM  Result Value Ref Range   WBC 4.6 4.0 - 10.5 K/uL    RBC 4.10 (L) 4.22 -  5.81 MIL/uL   Hemoglobin 12.2 (L) 13.0 - 17.0 g/dL   HCT 38.0 (L) 39.0 - 52.0 %   MCV 92.7 78.0 - 100.0 fL   MCH 29.8 26.0 - 34.0 pg   MCHC 32.1 30.0 - 36.0 g/dL   RDW 14.0 11.5 - 15.5 %   Platelets 326 150 - 400 K/uL   Neutrophils Relative % 65 %   Lymphocytes Relative 18 %   Monocytes Relative 14 %   Eosinophils Relative 3 %   Basophils Relative 0 %   Neutro Abs 3.1 1.7 - 7.7 K/uL   Lymphs Abs 0.8 0.7 - 4.0 K/uL   Monocytes Absolute 0.6 0.1 - 1.0 K/uL   Eosinophils Absolute 0.1 0.0 - 0.7 K/uL   Basophils Absolute 0.0 0.0 - 0.1 K/uL   RBC Morphology OVAL MACROCYTES     Comment: POLYCHROMASIA PRESENT  I-STAT 3, arterial blood gas (G3+)     Status: Abnormal   Collection Time: 05/27/17  1:02 AM  Result Value Ref Range   pH, Arterial 7.440 7.350 - 7.450   pCO2 arterial 30.7 (L) 32.0 - 48.0 mmHg   pO2, Arterial 107.0 83.0 - 108.0 mmHg   Bicarbonate 20.9 20.0 - 28.0 mmol/L   TCO2 22 0 - 100 mmol/L   O2 Saturation 98.0 %   Acid-base deficit 2.0 0.0 - 2.0 mmol/L   Patient temperature 98.2 F    Collection site RADIAL, ALLEN'S TEST ACCEPTABLE    Drawn by RT    Sample type ARTERIAL   Glucose, capillary     Status: Abnormal   Collection Time: 05/27/17  3:17 AM  Result Value Ref Range   Glucose-Capillary 102 (H) 65 - 99 mg/dL  Glucose, capillary     Status: Abnormal   Collection Time: 05/27/17  8:34 AM  Result Value Ref Range   Glucose-Capillary 109 (H) 65 - 99 mg/dL    Recent Results (from the past 240 hour(s))  Blood Culture (routine x 2)     Status: Abnormal   Collection Time: 05/20/17 12:55 PM  Result Value Ref Range Status   Specimen Description BLOOD RIGHT ANTECUBITAL  Final   Special Requests   Final    BOTTLES DRAWN AEROBIC AND ANAEROBIC Blood Culture adequate volume   Culture  Setup Time   Final    IN BOTH AEROBIC AND ANAEROBIC BOTTLES GRAM POSITIVE COCCI IN CHAINS CRITICAL RESULT CALLED TO, READ BACK BY AND VERIFIED WITH: TO GABBOTT(PHARMd) BY  TCLEVELAND 05/21/2017 AT 5:06AM    Culture STREPTOCOCCUS PNEUMONIAE (A)  Final   Report Status 05/23/2017 FINAL  Final   Organism ID, Bacteria STREPTOCOCCUS PNEUMONIAE  Final      Susceptibility   Streptococcus pneumoniae - MIC*    ERYTHROMYCIN 4 RESISTANT Resistant     LEVOFLOXACIN 0.5 SENSITIVE Sensitive     PENICILLIN (meningitis) 0.25 RESISTANT Resistant     PENICILLIN (non-meningitis) 0.25 SENSITIVE Sensitive     CEFTRIAXONE (non-meningitis) <=0.12 SENSITIVE Sensitive     CEFTRIAXONE (meningitis) <=0.12 SENSITIVE Sensitive     * STREPTOCOCCUS PNEUMONIAE  Fungus culture, blood     Status: None (Preliminary result)   Collection Time: 05/20/17 12:55 PM  Result Value Ref Range Status   Specimen Description BLOOD RIGHT ANTECUBITAL  Final   Special Requests   Final    BOTTLES DRAWN AEROBIC AND ANAEROBIC Blood Culture adequate volume   Culture NO GROWTH 6 DAYS  Final   Report Status PENDING  Incomplete  Blood Culture ID Panel (Reflexed)  Status: Abnormal   Collection Time: 05/20/17 12:55 PM  Result Value Ref Range Status   Enterococcus species NOT DETECTED NOT DETECTED Final   Listeria monocytogenes NOT DETECTED NOT DETECTED Final   Staphylococcus species NOT DETECTED NOT DETECTED Final   Staphylococcus aureus NOT DETECTED NOT DETECTED Final   Streptococcus species DETECTED (A) NOT DETECTED Final    Comment: CRITICAL RESULT CALLED TO, READ BACK BY AND VERIFIED WITH: TO GABBOTT(PHARMd) BY TCLEVELAND 05/21/2017 AT 5:06AM    Streptococcus agalactiae NOT DETECTED NOT DETECTED Final   Streptococcus pneumoniae DETECTED (A) NOT DETECTED Final    Comment: CRITICAL RESULT CALLED TO, READ BACK BY AND VERIFIED WITH: TO GABBOTT(PHARMd) BY TCLEVELAND 05/21/2017 AT 5:06AM    Streptococcus pyogenes NOT DETECTED NOT DETECTED Final   Acinetobacter baumannii NOT DETECTED NOT DETECTED Final   Enterobacteriaceae species NOT DETECTED NOT DETECTED Final   Enterobacter cloacae complex NOT DETECTED  NOT DETECTED Final   Escherichia coli NOT DETECTED NOT DETECTED Final   Klebsiella oxytoca NOT DETECTED NOT DETECTED Final   Klebsiella pneumoniae NOT DETECTED NOT DETECTED Final   Proteus species NOT DETECTED NOT DETECTED Final   Serratia marcescens NOT DETECTED NOT DETECTED Final   Haemophilus influenzae NOT DETECTED NOT DETECTED Final   Neisseria meningitidis NOT DETECTED NOT DETECTED Final   Pseudomonas aeruginosa NOT DETECTED NOT DETECTED Final   Candida albicans NOT DETECTED NOT DETECTED Final   Candida glabrata NOT DETECTED NOT DETECTED Final   Candida krusei NOT DETECTED NOT DETECTED Final   Candida parapsilosis NOT DETECTED NOT DETECTED Final   Candida tropicalis NOT DETECTED NOT DETECTED Final  Blood Culture (routine x 2)     Status: None   Collection Time: 05/20/17  1:50 PM  Result Value Ref Range Status   Specimen Description BLOOD LEFT FOREARM  Final   Special Requests   Final    BOTTLES DRAWN AEROBIC AND ANAEROBIC Blood Culture results may not be optimal due to an inadequate volume of blood received in culture bottles   Culture NO GROWTH 5 DAYS  Final   Report Status 05/25/2017 FINAL  Final  CSF culture     Status: Abnormal   Collection Time: 05/20/17  4:10 PM  Result Value Ref Range Status   Specimen Description CSF TUBE 2  Final   Special Requests Immunocompromised  Final   Gram Stain   Final    WBC PRESENT, PREDOMINANTLY MONONUCLEAR YEAST CYTOSPIN SMEAR CRITICAL RESULT CALLED TO, READ BACK BY AND VERIFIED WITH: Debby Bud AT 1905 05/20/17 BY L BENFIELD    Culture CRYPTOCOCCUS NEOFORMANS (A)  Final   Report Status 05/24/2017 FINAL  Final  MRSA PCR Screening     Status: None   Collection Time: 05/20/17  6:28 PM  Result Value Ref Range Status   MRSA by PCR NEGATIVE NEGATIVE Final    Comment:        The GeneXpert MRSA Assay (FDA approved for NASAL specimens only), is one component of a comprehensive MRSA colonization surveillance program. It is not intended  to diagnose MRSA infection nor to guide or monitor treatment for MRSA infections.   Culture, respiratory (NON-Expectorated)     Status: None   Collection Time: 05/21/17 12:10 PM  Result Value Ref Range Status   Specimen Description TRACHEAL ASPIRATE  Final   Special Requests NONE  Final   Gram Stain   Final    MODERATE WBC PRESENT,BOTH PMN AND MONONUCLEAR NO ORGANISMS SEEN    Culture MODERATE BORDETELLA BRONCHISEPTICA  Final   Report Status 05/26/2017 FINAL  Final   Organism ID, Bacteria BORDETELLA BRONCHISEPTICA  Final      Susceptibility   Bordetella bronchiseptica - MIC*    CEFEPIME 16 INTERMEDIATE Intermediate     CEFAZOLIN >=64 RESISTANT Resistant     GENTAMICIN 2 SENSITIVE Sensitive     CIPROFLOXACIN 1 SENSITIVE Sensitive     IMIPENEM 0.5 SENSITIVE Sensitive     TRIMETH/SULFA 80 RESISTANT Resistant     * MODERATE BORDETELLA BRONCHISEPTICA  Culture, blood (Routine X 2) w Reflex to ID Panel     Status: None (Preliminary result)   Collection Time: 05/22/17 11:51 AM  Result Value Ref Range Status   Specimen Description BLOOD LEFT ARM  Final   Special Requests IN PEDIATRIC BOTTLE Blood Culture adequate volume  Final   Culture NO GROWTH 4 DAYS  Final   Report Status PENDING  Incomplete  Culture, blood (Routine X 2) w Reflex to ID Panel     Status: None (Preliminary result)   Collection Time: 05/22/17 11:57 AM  Result Value Ref Range Status   Specimen Description BLOOD LEFT HAND  Final   Special Requests IN PEDIATRIC BOTTLE Blood Culture adequate volume  Final   Culture NO GROWTH 4 DAYS  Final   Report Status PENDING  Incomplete  Gastrointestinal Panel by PCR , Stool     Status: Abnormal   Collection Time: 05/24/17 11:52 AM  Result Value Ref Range Status   Campylobacter species DETECTED (A) NOT DETECTED Final    Comment: RESULT CALLED TO, READ BACK BY AND VERIFIED WITH: [name] CARMICHAEL AT 2005 ON 05/25/17 RWW    Plesimonas shigelloides NOT DETECTED NOT DETECTED Final    Salmonella species NOT DETECTED NOT DETECTED Final   Yersinia enterocolitica NOT DETECTED NOT DETECTED Final   Vibrio species NOT DETECTED NOT DETECTED Final   Vibrio cholerae NOT DETECTED NOT DETECTED Final   Enteroaggregative E coli (EAEC) NOT DETECTED NOT DETECTED Final   Enterotoxigenic E coli (ETEC) NOT DETECTED NOT DETECTED Final   Shiga like toxin producing E coli (STEC) DETECTED (A) NOT DETECTED Final    Comment: RESULT CALLED TO, READ BACK BY AND VERIFIED WITH: [name] CARMICHAEL AT 2005 ON 05/25/17 RWW    E. coli O157 NOT DETECTED NOT DETECTED Final   Shigella/Enteroinvasive E coli (EIEC) NOT DETECTED NOT DETECTED Final   Cryptosporidium NOT DETECTED NOT DETECTED Final   Cyclospora cayetanensis NOT DETECTED NOT DETECTED Final   Entamoeba histolytica NOT DETECTED NOT DETECTED Final   Giardia lamblia DETECTED (A) NOT DETECTED Final   Adenovirus F40/41 NOT DETECTED NOT DETECTED Final   Astrovirus NOT DETECTED NOT DETECTED Final   Norovirus GI/GII NOT DETECTED NOT DETECTED Final   Rotavirus A NOT DETECTED NOT DETECTED Final   Sapovirus (I, II, IV, and V) DETECTED (A) NOT DETECTED Final    Lipid Panel  Recent Labs  05/26/17 1407  TRIG 150*    Studies/Results: Dg Chest Port 1 View  Result Date: 05/27/2017 CLINICAL DATA:  Assess endotracheal tube and nasogastric tube. EXAM: PORTABLE CHEST 1 VIEW COMPARISON:  05/26/2017 FINDINGS: Endotracheal tube is been removed. Nasogastric tube enters stomach with the tip in the fundus. Heart mediastinal shadows are normal. The lungs are clear. IMPRESSION: No active disease. Electronically Signed   By: Nelson Chimes M.D.   On: 05/27/2017 06:54   Dg Chest Port 1 View  Result Date: 05/26/2017 CLINICAL DATA:  Acute respiratory failure. EXAM: PORTABLE CHEST 1 VIEW COMPARISON:  05/25/2017  and prior radiograph FINDINGS: The cardiomediastinal silhouette is unremarkable. An endotracheal tube with tip 3.1 cm above the carina and NG tube entering the  stomach with tip off the field of view again noted. There is no evidence of focal airspace disease, pulmonary edema, suspicious pulmonary nodule/mass, pleural effusion, or pneumothorax. No acute bony abnormalities are identified. IMPRESSION: Support apparatus as described.  No significant change. Electronically Signed   By: Margarette Canada M.D.   On: 05/26/2017 07:08   Dg Chest Port 1 View  Result Date: 05/25/2017 CLINICAL DATA:  Acute respiratory failure EXAM: PORTABLE CHEST 1 VIEW COMPARISON:  Yesterday FINDINGS: Endotracheal tube tip is just below the clavicular heads. An orogastric tube reaches the stomach. The lungs are clear. Normal heart size. No osseous findings. IMPRESSION: 1. Unremarkable positioning of endotracheal and orogastric tubes. 2. Lungs remain clear. Electronically Signed   By: Monte Fantasia M.D.   On: 05/25/2017 16:17    Medications:  Scheduled: . acetaminophen  650 mg Oral Q24H  . [START ON 05/31/2017] azithromycin  1,200 mg Per Tube Weekly  . diphenhydrAMINE  25 mg Oral Q24H  . flucytosine  1,500 mg Per Tube Q6H  . insulin aspart  2-6 Units Subcutaneous Q4H  . mouth rinse  15 mL Mouth Rinse BID  . metoprolol tartrate  5 mg Intravenous Q8H  . potassium chloride  40 mEq Oral Daily  . sulfamethoxazole-trimethoprim  10 mL Per Tube Daily   Continuous: . sodium chloride 250 mL (05/27/17 1000)  . amphotericin  B  Liposome (AMBISOME) ADULT IV Stopped (05/26/17 2043)  . cefTRIAXone (ROCEPHIN)  IV Stopped (05/27/17 2563)  . dextrose 5 % and 0.45 % NaCl with KCl 20 mEq/L 50 mL/hr at 05/27/17 1000  . doxycycline (VIBRAMYCIN) IV Stopped (05/27/17 0225)  . famotidine (PEPCID) IV 20 mg (05/27/17 0941)  . feeding supplement (VITAL AF 1.2 CAL) 1,000 mL (05/27/17 1000)  . levETIRAcetam 1,000 mg (05/27/17 1006)  . propofol (DIPRIVAN) infusion Stopped (05/26/17 2000)  . sodium chloride    . 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 Fungal elements noted. On L-amphotericin B and flucytosine. ID following.  2. MRI from 6/1 revealed multifocal cortical diffusion restriction involving both cerebellar hemispheres and the bilateral temporal, occipital and frontal lobes, as well as the deep gray nuclei. 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 Postictal changes could, however,have a similar appearance. Supportive of strokes secondary to perivascular infection/inflammation, areas of subarachnoid or leptomeningeal contrast enhancement at the sites of diffusion restriction are also noted.  3. EEG: No seizures or epileptiform discharges.This would make postictal changes less likely as the etiology for the cortical restricted diffusion on MRI.  4. EVD. Followed by Neurosurgery.  5. Strep pneumo bacteremia. On ceftriaxone for 14 day course.  6. Bordetella pneumonia. On doxycycline for 5-7 days. On droplet precautions.   Recommendations: 1. Continue Keppra 1000 mg IV BID 2. Appreciate neurosurgery assistance. Ventricular drain in place; clear colorless CSF drainage noted with setting at 10 mm Hg.  3. Continue flucytosine per tube and IV amphotericin B. ID is following.  4. Now extubated.  5. Clinically improving. Now intermittently following commands.    LOS: 7 days   @  Electronically signed: Dr. Kerney Elbe 05/27/2017  10:41 AM

## 2017-05-27 NOTE — Progress Notes (Signed)
[name] gown and pad were noted to be wet when repositioning patient for PICC line placement. The patient's lower abdomen was significantly distended and urine would leak when pressure was applied to abd. In and out catheterization relieved 1375 mL of clear yellow urine from pt's bladder. [name] HR slowed to NSR. Patient's output has not raised suspicion of urinary retention up to this point Regular bladder scans will be performed to ensure pt is not retaining.

## 2017-05-27 NOTE — Progress Notes (Addendum)
[facility]    Date of Admission:  05/20/2017   Total days of antibiotics 8        Day 8 L-ampho/flucytosine        Day 8 ceftriaxone/doxycycline 3        Day 8 bactrim proph   ID: Justin Ball is a 26 y.o. male with CM with strep pneumo bacteremia and bordetella bronchiseptica PNA Principal Problem:   Seizure (HCC) Active Problems:   HIV (human immunodeficiency virus infection) (HCC)   Rash and nonspecific skin eruption   Insomnia   Bipolar disorder (HCC)   Intersphincteric abscess   AIDS (HCC)   Headache   Meningoencephalitis   Respiratory failure (HCC)    Subjective: Afebrile. extubated this morning. Loosing peripheral venous access. He missed one dose of flucytosine since trying to re-establish oral access Medications:  . acetaminophen  650 mg Oral Q24H  . [START ON 05/31/2017] azithromycin  1,200 mg Per Tube Weekly  . diphenhydrAMINE  25 mg Oral Q24H  . flucytosine  1,500 mg Per Tube Q6H  . insulin aspart  2-6 Units Subcutaneous Q4H  . mouth rinse  15 mL Mouth Rinse BID  . metoprolol tartrate  5 mg Intravenous Q8H  . potassium chloride  40 mEq Oral Daily  . sulfamethoxazole-trimethoprim  10 mL Per Tube Daily    Objective: Vital signs in last 24 hours: Temp:  [97.7 F (36.5 C)-99.4 F (37.4 C)] 99.4 F (37.4 C) (06/03 1156) Pulse Rate:  [96-141] 138 (06/03 1200) Resp:  [16-35] 30 (06/03 1200) BP: (102-153)/(58-104) 144/86 (06/03 1200) SpO2:  [95 %-100 %] 97 % (06/03 1200) FiO2 (%):  [30 %] 30 % (06/02 1455) Weight:  [138 lb 7.2 oz (62.8 kg)] 138 lb 7.2 oz (62.8 kg) (06/03 0500)  Physical Exam  Constitutional: remains comfortable, eyes closed. He appears well-developed and well-nourished. No distress.  HENT: opens eyes spontaneously Mouth/Throat: Oropharynx is clear and moist. No oropharyngeal exudate.  Cardiovascular: tachy, regular rhythm and normal heart sounds. Exam reveals no gallop and no friction rub.  No murmur heard.    Pulmonary/Chest: Effort normal and breath sounds normal. No respiratory distress. He has no wheezes.  Neuro = did not follow as much commands, eyes open spontaneously. Moves facial muscles spontaneously Skin: small papules on chest ? crypto  Lab Results  Recent Labs  05/26/17 0321 05/27/17 0028  WBC  --  4.6  HGB  --  12.2*  HCT  --  38.0*  NA 140 142  K 4.0 3.9  CL 111 112*  CO2 21* 20*  BUN 22* 30*  CREATININE 0.77 1.09     Microbiology: reviewed Studies/Results: Dg Chest Port 1 View  Result Date: 05/27/2017 CLINICAL DATA:  Assess endotracheal tube and nasogastric tube. EXAM: PORTABLE CHEST 1 VIEW COMPARISON:  05/26/2017 FINDINGS: Endotracheal tube is been removed. Nasogastric tube enters stomach with the tip in the fundus. Heart mediastinal shadows are normal. The lungs are clear. IMPRESSION: No active disease. Electronically Signed   By: Paulina FusiMark  Shogry M.D.   On: 05/27/2017 06:54   Dg Chest Port 1 View  Result Date: 05/26/2017 CLINICAL DATA:  Acute respiratory failure. EXAM: PORTABLE CHEST 1 VIEW COMPARISON:  05/25/2017 and prior radiograph FINDINGS: The cardiomediastinal silhouette is unremarkable. An endotracheal tube with tip 3.1 cm above the carina and NG tube entering the stomach with tip off the field of view again noted. There is no evidence of focal airspace disease, pulmonary edema, suspicious  pulmonary nodule/mass, pleural effusion, or pneumothorax. No acute bony abnormalities are identified. IMPRESSION: Support apparatus as described.  No significant change. Electronically Signed   By: Harmon Pier M.D.   On: 05/26/2017 07:08   Dg Chest Port 1 View  Result Date: 05/25/2017 CLINICAL DATA:  Acute respiratory failure EXAM: PORTABLE CHEST 1 VIEW COMPARISON:  Yesterday FINDINGS: Endotracheal tube tip is just below the clavicular heads. An orogastric tube reaches the stomach. The lungs are clear. Normal heart size. No osseous findings. IMPRESSION: 1. Unremarkable positioning  of endotracheal and orogastric tubes. 2. Lungs remain clear. Electronically Signed   By: Marnee Spring M.D.   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 Will increase fluid bolus to IL pre and post infusion since had some increase in Cr on today's labs. Pharmacy repleting electrolyte losses. Will need to resample the EVD collection system to send CSF for cell count, glu, protein, gram stain, and aerobic culture to see if CM is clearing.  He made better gains yesterday. Appears resting.  aki = slight increase in cr. We will increase IVF bolus with ampho. Follow cr tomorrow  Diarrhea = now resolved. Have + GI pathogen panel which I don't think is relevant. At this time, recommend to d/c enteric precautions since he is not having any diarrhea.  Strep pneumoniae bacteremia = plan to treat for 14d with IV ceftriaxone 2gm IV daily  Bordetella pneumonia = there are case reports of pna in hiv patients, it can be both pathogenic in certain immunocompromised host.   - plan for doxy treat for 5-7d. Droplet can d/c once 5 days of treatment in place  oi proph = continue with daily bactrim and weekly azithromycin  hiv = hold on art while on CM treatment, for at least 5 wk  Chronic hep b = has high viral load but AST/ALt are normal. No need to treat at this time.  Loss of venous access = can place picc line  Jamyrah Saur, [facility] for Infectious Diseases Cell: [phone] Pager: [phone]  05/27/2017, 1:07 PM

## 2017-05-27 NOTE — Progress Notes (Signed)
Pt with HR sustained in 120s. No longer following commands but still purposeful movement in BUE. MD to place orders for ABG and lopressor.

## 2017-05-27 NOTE — Progress Notes (Addendum)
PULMONARY / CRITICAL CARE MEDICINE   Name: DEIDRICK RAINEY MRN: [id number] DOB: Nov 01, 1991    ADMISSION DATE:  05/20/2017  CHIEF COMPLAINT:  Seizures, altered MS.   brief Patient has been having headache for past few days. He was seen in ER 2 days ago and discharged. Today he had witnessed seizures by family and repeat in ER. When brought in to ER, he was post ictal but did follow some commands. Then he had repeat seizures and was intubated. He had disconjugate eyes which resolved.   He has also been having fevers lately. Grandmother expressed concern of his compliance with HIV meds.   26 year old male with history ofHIV AIDS with history of noncompliance here with altered mental status. History is limited as patient was pulled out of his car unresponsive. Family did report that he thought the patient had a seizure. On arrival, patient is postictal and unable to provide history.  Per review of records, patient was seen 3 days ago in the ER for headache. He was afebrile and otherwise very well-appearing at that time. He had a CT scan which was negative and sent home with supportive care.    SUBJECTIVE/OVERNIGHT/INTERVAL HX 6/1 - propofol at 30 Followed commands for me today! MRI performed this morning as below  6/2 - agitated on wua. But did follow commands. On diprivan. Sinus tachy HR 150 and improved with lopressor   SUBJECTIVE/OVERNIGHT/INTERVAL HX 6/3  - extubated last night aftfer cuff leak issues. PerNSGY - EVD drain remains patent. Intermittently following commands (definitley did pre edxtubation). RN notices less command following post extubation  VITAL SIGNS: BP (!) 144/86 (BP Location: Left Arm)   Pulse (!) 138   Temp 99.4 F (37.4 C) (Axillary)   Resp (!) 30   Ht 6\' 1"  (1.854 m)   Wt 62.8 kg (138 lb 7.2 oz)   SpO2 97%   BMI 18.27 kg/m   HEMODYNAMICS:    VENTILATOR SETTINGS: Vent Mode: PRVC FiO2 (%):  [30 %] 30 % Set Rate:  [16 bmp] 16 bmp Vt Set:  [560  mL] 560 mL PEEP:  [5 cmH20] 5 cmH20 Plateau Pressure:  [12 cmH20] 12 cmH20  INTAKE / OUTPUT: I/O last 3 completed shifts: In: 5952.2 [I.V.:2498; WU/JW:1191.4; IV Piggyback:1970] Out: 6541 [Urine:6130; Drains:411]  PHYSICAL EXAMINATION:  General Appearance:    Looks better  Head:    Normocephalic, without obvious abnormality, atraumatic  Eyes:    PERRL - yes, conjunctiva/corneas - clear      Ears:    Normal external ear canals, both ears  Nose:   NG tube - no  Throat:  ETT TUBE - no , OG tube - no  Neck:   Supple,  No enlargement/tenderness/nodules     Lungs:     Clear to auscultation bilaterally,   Chest wall:    No deformity  Heart:    S1 and S2 normal, no murmur, CVP - no.  Pressors - no  Abdomen:     Soft, no masses, no organomegaly  Genitalia:    Not done  Rectal:   not done  Extremities:   Extremities- intact     Skin:   Intact in exposed areas .     Neurologic:   Sedation - none -> RASS - 2/-3       PULMONARY  Recent Labs Lab 05/20/17 1704 05/21/17 0415 05/22/17 0350 05/26/17 2042 05/27/17 0102  PHART 7.561* 7.535* 7.492* 7.422 7.440  PCO2ART 26.3* 26.3* 27.7* 30.5* 30.7*  PO2ART 96.0  219* 171* 93.0 107.0  HCO3 23.7 22.1 21.0 20.0 20.9  TCO2 25  --   --  21 22  O2SAT 99.0 99.5 99.3 98.0 98.0    CBC  Recent Labs Lab 05/23/17 0207 05/24/17 0317 05/27/17 0028  HGB 10.4* 11.3* 12.2*  HCT 31.7* 34.3* 38.0*  WBC 5.0 4.9 4.6  PLT 209 273 326    COAGULATION  Recent Labs Lab 05/20/17 1900  INR 1.20    CARDIAC    Recent Labs Lab 05/20/17 1900  TROPONINI <0.03   No results for input(s): PROBNP in the last 168 hours.   CHEMISTRY  Recent Labs Lab 05/22/17 0440 05/22/17 1806 05/23/17 0207 05/24/17 0317 05/25/17 0359 05/26/17 0321 05/27/17 0028  NA 139  --  142 144 141 140 142  K 3.8  --  3.4* 3.8 4.2 4.0 3.9  CL 111  --  112* 112* 113* 111 112*  CO2 22  --  22 23 18* 21* 20*  GLUCOSE 133*  --  128* 116* 124* 137* 115*  BUN 15   --  23* 24* 23* 22* 30*  CREATININE 0.80  --  0.86 0.76 0.61 0.77 1.09  CALCIUM 8.5*  --  8.7* 9.0 9.0 9.3 9.5  MG 1.9 1.9 2.0 2.0 1.8 2.1 1.9  PHOS 3.4 3.3 4.0  --  4.1  --  6.5*   Estimated Creatinine Clearance: 91.2 mL/min (by C-G formula based on SCr of 1.09 mg/dL).   LIVER  Recent Labs Lab 05/20/17 1900 05/23/17 0207  AST 44* 27  ALT 21 17  ALKPHOS 128* 126  BILITOT 0.7 0.8  PROT 8.1 7.9  ALBUMIN 2.6* 2.2*  INR 1.20  --      INFECTIOUS  Recent Labs Lab 05/20/17 1900 05/20/17 2134 05/27/17 0028  LATICACIDVEN 2.3* 2.5* 0.9  PROCALCITON 0.20  --   --      ENDOCRINE CBG (last 3)   Recent Labs  05/27/17 0317 05/27/17 0834 05/27/17 1154  GLUCAP 102* 109* 116*         IMAGING x48h  - image(s) personally visualized  -   highlighted in bold Dg Chest Port 1 View  Result Date: 05/27/2017 CLINICAL DATA:  Assess endotracheal tube and nasogastric tube. EXAM: PORTABLE CHEST 1 VIEW COMPARISON:  05/26/2017 FINDINGS: Endotracheal tube is been removed. Nasogastric tube enters stomach with the tip in the fundus. Heart mediastinal shadows are normal. The lungs are clear. IMPRESSION: No active disease. Electronically Signed   By: Paulina Fusi M.D.   On: 05/27/2017 06:54   Dg Chest Port 1 View  Result Date: 05/26/2017 CLINICAL DATA:  Acute respiratory failure. EXAM: PORTABLE CHEST 1 VIEW COMPARISON:  05/25/2017 and prior radiograph FINDINGS: The cardiomediastinal silhouette is unremarkable. An endotracheal tube with tip 3.1 cm above the carina and NG tube entering the stomach with tip off the field of view again noted. There is no evidence of focal airspace disease, pulmonary edema, suspicious pulmonary nodule/mass, pleural effusion, or pneumothorax. No acute bony abnormalities are identified. IMPRESSION: Support apparatus as described.  No significant change. Electronically Signed   By: Harmon Pier M.D.   On: 05/26/2017 07:08   Dg Chest Port 1 View  Result Date:  05/25/2017 CLINICAL DATA:  Acute respiratory failure EXAM: PORTABLE CHEST 1 VIEW COMPARISON:  Yesterday FINDINGS: Endotracheal tube tip is just below the clavicular heads. An orogastric tube reaches the stomach. The lungs are clear. Normal heart size. No osseous findings. IMPRESSION: 1. Unremarkable positioning of endotracheal and  orogastric tubes. 2. Lungs remain clear. Electronically Signed   By: Marnee Spring M.D.   On: 05/25/2017 16:17        STUDIES:  Head Ct 5/25 >> no acute abnormality Head Ct 5/27 >> no acute abnormality, unchanged minimal R internal capsule hypodensity, unclear significance  Head CT 5/28 >> no new changes c/w 5/27 or 5/25 MRI Brain 6/1 >> multifocal cortical diffusion restriction bilateral cerebellar, bilateral temporal occipital and frontal lobes concerning for possible hypoxic injury, areas of subarachnoid or leptomeningeal contrast enhancement most consistent with meningitis  CULTURES: Blood 5/27 >> S pneumo >> R to erythro, PCN CSF 5/27 >> cryptococcus Blood fungal 5/27 >>  Tracheal aspirate 5/27 >> bordatella bronchiseptica (R to cefazolin) >> reincubated >>   ANTIBIOTICS: Amphotericin 5/27 >>  Flucytosine 5/27 >>  Ceftriaxone 5/27 >>  Bactrim 5/27 >>  Azithromycin 5/31 (weekly) >>   SIGNIFICANT EVENTS: Urgent R frontoventriculostomy 5/28 following acute decompensation, fixed R pupil  LINES/TUBES: ETT 5/27 >>  ventric 5/28 >>   DISCUSSION: 26 year old man with HIV/AIDS, chronic hep B, recent headaches, admitted after he was found seizing. Intubated for protection. 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 He also has a pneumococcal bacteremia. He excused acute decompensation on 5/28 with decorticate posturing and asymmetric pupils. Underwent emergent ventriculostomy for depressurization with improvement. Treated initially for IRIS with steroids, now being tapered off in absence of supporting findings. MRI brain pending  ASSESSMENT /  PLAN:  PULMONARY A: Acute respiratory failure due to altered mental status, failure to protect airway   05/27/2017 - etibated 05/26/17 and remains extubated  P:   pulm toilet if he will follow commands  CARDIOVASCULAR A:  Hypertension  05/27/2017 -> NOT on Pressors as of 05/27/2017. Sinus tach needing lopressors   P:  Telemetry monitoring DC Hydralazine as needed due to tachycardia Do lopressor prn which seems to help  RENAL A:   At risk renal injury on amphotericin, no evidence of this so far  05/27/2017 - mild low mag < 2gm%  P:   Follow BMP, urine output Replace electrolytes as indicated  GASTROINTESTINAL A:   Chronic Hepatitis B, high titer greater than 5 billion copies per mL in 02/2017 SUP Diarrhea   05/26/2017 - polymicrobial stool  PCR. ID does not think true pathogen d  P:   PPI Repeat Hep B viral load pending Per ID   HEMATOLOGIC A:   No acute issues P:  Follow CBC DVT prophylaxis  INFECTIOUS A:   HIV/AIDS Possible immune reconstitution syndrome, doubt at this point given elevated viral load and leukopenia Cryptococcal meningitis Pneumococcal bacteremia Chronic hepatitis B Bordetella bronchiseptica bronchitis/pneumonia   05/27/2017 - afebrile  P:   Per ID  ENDOCRINE A:   No acute issues   P:   Follow CBG  NEUROLOGIC A:   Acute encephalopathy, toxic metabolic. Also question some component of anoxic injury given MRI results Cryptococcal meningitis Seizures Fixed pupil, resolved after ventric drain placed  05/27/2017 - slowly improving but only following command intermittently. Butovreall slowly improving encephalopathy  P:   RASS goal: -1 to 0 Wean propofol as able, may be able to assess for extubation and airway protection Keppra as ordered Ventriculostomy management as per neurosurgery recommendations. Appreciate assistance   FAMILY  - Updates: Updated grandmother at bedside 6/1. And 05/27/17  - Inter-disciplinary family meet or  Palliative Care meeting due by:  05/26/17    Dr. Kalman Shan, M.D., F.C.C.P Pulmonary and Critical Care Medicine Staff Physician [facility]  [place] [facility] Pager: [phone], If no answer or between  15:00h - 7:00h: call [phone]  05/27/2017 1:22 PM

## 2017-05-27 NOTE — Progress Notes (Signed)
Peripherally Inserted Central Catheter/Midline Placement  The IV Nurse has discussed with the patient and/or persons authorized to consent for the patient, the purpose of this procedure and the potential benefits and risks involved with this procedure.  The benefits include less needle sticks, lab draws from the catheter, and the patient may be discharged home with the catheter. Risks include, but not limited to, infection, bleeding, blood clot (thrombus formation), and puncture of an artery; nerve damage and irregular heartbeat and possibility to perform a PICC exchange if needed/ordered by physician.  Alternatives to this procedure were also discussed.  Bard Power PICC patient education guide, fact sheet on infection prevention and patient information card has been provided to patient /or left at bedside.    PICC/Midline Placement Documentation  PICC Triple Lumen 05/27/17 PICC Left Basilic 45 cm 1 cm (Active)  Indication for Insertion or Continuance of Line Vasoactive infusions 05/27/2017  4:45 PM  Exposed Catheter (cm) 1 cm 05/27/2017  4:45 PM  Site Assessment Clean;Dry;Intact 05/27/2017  4:45 PM  Lumen #1 Status Blood return noted;Flushed;Saline locked 05/27/2017  4:45 PM  Lumen #2 Status Blood return noted;Flushed;Saline locked 05/27/2017  4:45 PM  Lumen #3 Status Blood return noted;Flushed;Saline locked 05/27/2017  4:45 PM  Dressing Type Transparent 05/27/2017  4:45 PM  Dressing Status Clean;Dry;Intact;Antimicrobial disc in place 05/27/2017  4:45 PM  Dressing Intervention New dressing 05/27/2017  4:45 PM  Dressing Change Due 06/03/17 05/27/2017  4:45 PM       Justin Ball, Justin Ball 05/27/2017, 5:04 PM

## 2017-05-27 NOTE — Progress Notes (Signed)
Pt followed commands consistently during SBT with sedation off before extubation. Pt no longer following commands still off sedation. MD aware of fluctuating neuro status. To continue current care.

## 2017-05-27 NOTE — Progress Notes (Signed)
SLP Cancellation Note  Patient Details Name: Justin Ball MRN: [id number] DOB: 11/22/1991   Cancelled treatment:       Reason Eval/Treat Not Completed: Other (comment)  Clinical swallowing evaluation was attempted.  The patient was unable to follow any commands and when presented with ice at his lips he made no attempt to take the material.  Given this evaluation was not completed.  Recommend that the patient remain NPO.  ST will follow up next date to PO readiness.     [name], MA, CCC-SLP Acute Rehab SLP [phone] [name] 05/27/2017, 10:55 AM

## 2017-05-28 DIAGNOSIS — I639 Cerebral infarction, unspecified: Secondary | ICD-10-CM

## 2017-05-28 DIAGNOSIS — B451 Cerebral cryptococcosis: Secondary | ICD-10-CM

## 2017-05-28 LAB — CBC WITH DIFFERENTIAL/PLATELET
BASOS ABS: 0 10*3/uL (ref 0.0–0.1)
Basophils Relative: 0 %
EOS ABS: 0.4 10*3/uL (ref 0.0–0.7)
Eosinophils Relative: 9 %
HCT: 33.9 % — ABNORMAL LOW (ref 39.0–52.0)
Hemoglobin: 10.9 g/dL — ABNORMAL LOW (ref 13.0–17.0)
LYMPHS ABS: 0.5 10*3/uL — AB (ref 0.7–4.0)
Lymphocytes Relative: 12 %
MCH: 29.8 pg (ref 26.0–34.0)
MCHC: 32.2 g/dL (ref 30.0–36.0)
MCV: 92.6 fL (ref 78.0–100.0)
MONO ABS: 0.5 10*3/uL (ref 0.1–1.0)
Monocytes Relative: 11 %
NEUTROS PCT: 68 %
Neutro Abs: 3.1 10*3/uL (ref 1.7–7.7)
PLATELETS: 298 10*3/uL (ref 150–400)
RBC: 3.66 MIL/uL — AB (ref 4.22–5.81)
RDW: 14.1 % (ref 11.5–15.5)
WBC: 4.5 10*3/uL (ref 4.0–10.5)

## 2017-05-28 LAB — BASIC METABOLIC PANEL
ANION GAP: 6 (ref 5–15)
BUN: 36 mg/dL — ABNORMAL HIGH (ref 6–20)
CALCIUM: 9.1 mg/dL (ref 8.9–10.3)
CO2: 19 mmol/L — ABNORMAL LOW (ref 22–32)
Chloride: 112 mmol/L — ABNORMAL HIGH (ref 101–111)
Creatinine, Ser: 1.02 mg/dL (ref 0.61–1.24)
Glucose, Bld: 106 mg/dL — ABNORMAL HIGH (ref 65–99)
POTASSIUM: 4.2 mmol/L (ref 3.5–5.1)
Sodium: 137 mmol/L (ref 135–145)

## 2017-05-28 LAB — GLUCOSE, CAPILLARY
GLUCOSE-CAPILLARY: 134 mg/dL — AB (ref 65–99)
GLUCOSE-CAPILLARY: 113 mg/dL — AB (ref 65–99)
GLUCOSE-CAPILLARY: 134 mg/dL — AB (ref 65–99)
GLUCOSE-CAPILLARY: 111 mg/dL — AB (ref 65–99)
Glucose-Capillary: 119 mg/dL — ABNORMAL HIGH (ref 65–99)
Glucose-Capillary: 113 mg/dL — ABNORMAL HIGH (ref 65–99)

## 2017-05-28 LAB — MISC LABCORP TEST (SEND OUT): Labcorp test code: 140622

## 2017-05-28 LAB — MAGNESIUM: MAGNESIUM: 1.8 mg/dL (ref 1.7–2.4)

## 2017-05-28 LAB — PHOSPHORUS: PHOSPHORUS: 5 mg/dL — AB (ref 2.5–4.6)

## 2017-05-28 MED ORDER — SODIUM CHLORIDE 0.9 % IV BOLUS (SEPSIS)
1000.0000 mL | INTRAVENOUS | Status: DC
  Administered 2017-05-28 – 2017-06-18 (×23): 1000 mL via INTRAVENOUS

## 2017-05-28 MED ORDER — MAGNESIUM SULFATE 2 GM/50ML IV SOLN
2.0000 g | Freq: Once | INTRAVENOUS | Status: AC
  Administered 2017-05-28: 2 g via INTRAVENOUS
  Filled 2017-05-28: qty 50

## 2017-05-28 MED ORDER — ORAL CARE MOUTH RINSE
15.0000 mL | Freq: Two times a day (BID) | OROMUCOSAL | Status: DC
  Administered 2017-05-29 – 2017-06-26 (×41): 15 mL via OROMUCOSAL

## 2017-05-28 MED ORDER — VITAL AF 1.2 CAL PO LIQD
1000.0000 mL | ORAL | Status: DC
  Administered 2017-05-28 – 2017-06-14 (×22): 1000 mL
  Filled 2017-05-28 (×40): qty 1000

## 2017-05-28 MED ORDER — CHLORHEXIDINE GLUCONATE 0.12 % MT SOLN
15.0000 mL | Freq: Two times a day (BID) | OROMUCOSAL | Status: DC
Start: 2017-05-28 — End: 2017-06-27
  Administered 2017-05-28 – 2017-06-27 (×61): 15 mL via OROMUCOSAL
  Filled 2017-05-28 (×55): qty 15

## 2017-05-28 MED ORDER — SODIUM CHLORIDE 0.9 % IV BOLUS (SEPSIS)
1000.0000 mL | INTRAVENOUS | Status: DC
  Administered 2017-05-28 – 2017-06-18 (×22): 1000 mL via INTRAVENOUS

## 2017-05-28 NOTE — Progress Notes (Addendum)
Neurology Progress Note  I am assuming neurologic care of this patient from Dr. Otelia LimesLindzen. I have reviewed his chart at length. History is limited to chart review as the patient is currently encephalopathic and nonverbal and there is no family present at the bedside.   In brief, this is a 26-yo man with HIV/AIDS and history of medication noncompliance who presented to the emergency department on 05/20/17 with altered mental status. He had apparently been having headaches for the preceding several days. On the day of presentation family witnessed seizure activity and brought him to the emergency department. On arrival he was post ictal but able to follow commands before having further seizure activity. He was then intubated. Neurology was consulted and found the patient sedated following his intubation. CT scan of the head without contrast was reportedly unremarkable. Keppra was started at 1 g every 12 hours and stat EEG was requested. Lumbar puncture was performed by the ED physician, with opening pressure measured at 55 cm of water. Yeast was identified in the patient's CSF with positive cryptococcal antigen and a titer of greater than 2560. He was placed on amphotericin B and flucytosine. He was admitted to the ICU for further management. EEG showed possible PLEDs near the right vertex and Dilantin was added to his Keppra. He then had long-term EEG initiated which showed severe diffuse slowing without epileptiform abnormalities or seizures. Initial blood cultures are positive for strep pneumo and ceftriaxone was added to his regimen.  On 5/28, neurology was notified by nursing staff that the patient had a fixed and dilated right pupil. He was given a dose of mannitol and taken for a stat head CT. His pupillary asymmetry resolved after the mannitol but the right pupil remained nonreactive and was accompanied by extensor posturing. Neurosurgery was consulted and EVD was placed at the patient's bedside. 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 On 05/24/17, Bordetella bronchiseptica was isolated from a respiratory specimen. MRI brain was obtained on 05/25/17 and showed numerous areas of restricted diffusion involving the deep gray nuclei, cerebral hemispheres, temporal lobes, occipital lobe, and frontal lobes bilaterally. These were felt to represent multifocal stroke in the setting of cryptococcus-induced perivascular inflammation. By 05/26/17, he was noted to have increased movement of his extremities and was following some commands with his right arm. He developed a cuff leak with his ETT on 05/26/17 and was extubated later that evening. He was noted to be following commands consistently prior to extubation but this became more inconsistent afterwards.  This morning, he will look at me when I speak to him but does not follow any commands. He makes no attempts to vocalize. Review of systems therefore cannot be obtained.  Medications reviewed and reconciled.   Pertinent meds: Amphotericin B 250 mg every 24 hours He azithromycin 1200 mg weekly Ceftriaxone 2 g every 24 hours Doxycycline 100 mg every 12 hours Flucytosine 1500 mg every 6 hours Keppra 1000 g every 12 hours Bactrim 10 mL daily  Current Meds:   Current Facility-Administered Medications:  .  0.9 %  sodium chloride infusion, 250 mL, Intravenous, PRN, Wilber OliphantPanchal, Amar, MD, Last Rate: 10 mL/hr at 05/28/17 0900, 250 mL at 05/28/17 0900 .  acetaminophen (TYLENOL) tablet 650 mg, 650 mg, Oral, Q4H PRN,  Wilber Oliphant, MD .  acetaminophen (TYLENOL) tablet 650 mg, 650 mg, Oral, Q24H, [name], Lisette Grinder, MD, 650 mg at 05/27/17 1827 .  albuterol (PROVENTIL) (2.5 MG/3ML) 0.083% nebulizer solution 2.5 mg, 2.5 mg,  Nebulization, Q2H PRN, Panchal, Amar, MD .  amphotericin B liposome (AMBISOME) 250 mg in dextrose 5 % 500 mL IVPB, 4 mg/kg, Intravenous, Q24H, Shaune Pollack, MD, Stopped at 05/27/17 2103 .  [START ON 05/31/2017] azithromycin (ZITHROMAX) 200 MG/5ML suspension 1,200 mg, 1,200 mg, Per Tube, Weekly, Judyann Munson, MD .  cefTRIAXone (ROCEPHIN) 2 g in dextrose 5 % 50 mL IVPB, 2 g, Intravenous, Q24H, Judyann Munson, MD, Stopped at 05/28/17 5427 .  Chlorhexidine Gluconate Cloth 2 % PADS 6 each, 6 each, Topical, Daily, Byrum, Robert S, MD .  dextrose 5 % and 0.45 % NaCl with KCl 20 mEq/L infusion, , Intravenous, Continuous, Panchal, Amar, MD, Last Rate: 50 mL/hr at 05/28/17 0900 .  diphenhydrAMINE (BENADRYL) capsule 25 mg, 25 mg, Oral, Q24H, [name], Lisette Grinder, MD, 25 mg at 05/27/17 1826 .  docusate (COLACE) 50 MG/5ML liquid 100 mg, 100 mg, Per Tube, BID PRN, Wilber Oliphant, MD .  doxycycline (VIBRAMYCIN) 100 mg in dextrose 5 % 250 mL IVPB, 100 mg, Intravenous, Q12H, Judyann Munson, MD, Stopped at 05/28/17 0315 .  famotidine (PEPCID) IVPB 20 mg premix, 20 mg, Intravenous, Q12H, Wilber Oliphant, MD, Stopped at 05/28/17 0936 .  feeding supplement (VITAL AF 1.2 CAL) liquid 1,000 mL, 1,000 mL, Per Tube, Continuous, Byrum, Les Pou, MD, Last Rate: 65 mL/hr at 05/28/17 0900, 1,000 mL at 05/28/17 0900 .  fentaNYL (SUBLIMAZE) injection 100 mcg, 100 mcg, Intravenous, Q2H PRN, Wilber Oliphant, MD, 100 mcg at 05/26/17 1716 .  flucytosine (ANCOBON) capsule 1,500 mg, 1,500 mg, Per Tube, Q6H, Coralyn Helling, MD, 1,500 mg at 05/28/17 0816 .  insulin aspart (novoLOG) injection 2-6 Units, 2-6 Units, Subcutaneous, Q4H, Wilber Oliphant, MD, 2 Units at 05/28/17 0405 .  levETIRAcetam (KEPPRA) 1,000 mg in sodium chloride 0.9 % 100 mL IVPB, 1,000 mg, Intravenous, Q12H, Rejeana Brock, MD, Stopped at 05/28/17 0919 .  MEDLINE mouth rinse, 15 mL, Mouth Rinse, BID, Leslye Peer, MD, 15 mL at 05/28/17 0913 .  metoprolol tartrate  (LOPRESSOR) injection 2.5-5 mg, 2.5-5 mg, Intravenous, Q3H PRN, Kalman Shan, MD, 5 mg at 05/28/17 0405 .  metoprolol tartrate (LOPRESSOR) injection 5 mg, 5 mg, Intravenous, Q8H, Deterding, Dorise Hiss, MD, 5 mg at 05/28/17 0533 .  ondansetron (ZOFRAN) injection 4 mg, 4 mg, Intravenous, Q6H PRN, Panchal, Amar, MD .  potassium chloride 20 MEQ/15ML (10%) solution 40 mEq, 40 mEq, Oral, Daily, Rumbarger, Faye Ramsay, RPH, 40 mEq at 05/28/17 0907 .  sodium chloride 0.9 % bolus 500 mL, 500 mL, Intravenous, Q24H, Judyann Munson, MD, 500 mL at 05/27/17 1930 .  sodium chloride 0.9 % bolus 500 mL, 500 mL, Intravenous, Q24H, Judyann Munson, MD .  sodium chloride flush (NS) 0.9 % injection 10-40 mL, 10-40 mL, Intracatheter, PRN, Leslye Peer, MD .  sulfamethoxazole-trimethoprim (BACTRIM,SEPTRA) 200-40 MG/5ML suspension 10 mL, 10 mL, Per Tube, Daily, Coralyn Helling, MD, 10 mL at 05/28/17 0907  Objective:  Temp:  [97.6 F (36.4 C)-99.4 F (37.4 C)] 97.6 F (36.4 C) (06/04 0800) Pulse Rate:  [94-138] 123 (06/04 0900) Resp:  [19-30] 25 (06/04 0900) BP: (118-147)/(67-95) 140/83 (06/04 0900) SpO2:  [96 %-100 %] 100 % (06/04 0900) Weight:  [62.5 kg (137 lb 12.6 oz)] 62.5 kg (137 lb 12.6 oz) (06/04 0400)  General:  WD AA man lying in bed. He is in NAD. He opens his eyes to voice and briefly fixes but does not consistently track. He is nonverbal. He did not follow any commands for me.  HEENT: He grimaces with passive neck flexion. NGT in place. Ventricular cathter in place R frontal region. Sclerae are anicteric. There is no conjunctival injection.  CV: Regular, tachy, no obvious murmur. Carotid pulses are 2+ and symmetric with no bruits. Distal pulses 2+ and symmetric.  Lungs: CTAB on anterior auscultation.  Extremities: No C/C/E. Neuro: MS: As noted above.  CN: Pupils are equal and reactive from 3-->2 mm bilaterally. He inconsistently blinks to threat. Eyes are conjugate, no forced deviation and no  nystagmus. The left corneal is relatively decreased. His face appears to be grossly symmetric. The remainder of his cranial nerve exam is limited by inability to participate with the exam.  Motor: Thin bulk diffusely. Tone appears to be relatively increased in the RUE. He does not participate with confrontational strength testing. He has some spontaneous movement of the arms, right more than left. His right leg is dangling off the bed suggesting some movement of that limb as well. No tremor or other abnormal movements are observed.  Sensation: He grimaces and withdraws from nailbed pressure x4.  DTRs: 2+, symmetric in the legs. DTRs 3+ in BUE, brisker on the R. Toes are mute bilaterally. Coordination/gait:  Unable to assess as he does not participate with the exam.    Labs: Lab Results  Component Value Date   WBC 4.5 05/28/2017   HGB 10.9 (L) 05/28/2017   HCT 33.9 (L) 05/28/2017   PLT 298 05/28/2017   GLUCOSE 106 (H) 05/28/2017   CHOL 125 09/28/2016   TRIG 150 (H) 05/26/2017   HDL 31 (L) 09/28/2016   LDLCALC 70 09/28/2016   ALT 17 05/23/2017   AST 27 05/23/2017   NA 137 05/28/2017   K 4.2 05/28/2017   CL 112 (H) 05/28/2017   CREATININE 1.02 05/28/2017   BUN 36 (H) 05/28/2017   CO2 19 (L) 05/28/2017   INR 1.20 05/20/2017   CBC Latest Ref Rng & Units 05/28/2017 05/27/2017 05/24/2017  WBC 4.0 - 10.5 K/uL 4.5 4.6 4.9  Hemoglobin 13.0 - 17.0 g/dL 10.9(L) 12.2(L) 11.3(L)  Hematocrit 39.0 - 52.0 % 33.9(L) 38.0(L) 34.3(L)  Platelets 150 - 400 K/uL 298 326 273    No results found for: HGBA1C Lab Results  Component Value Date   ALT 17 05/23/2017   AST 27 05/23/2017   ALKPHOS 126 05/23/2017   BILITOT 0.8 05/23/2017   CD4 count 2 HIV viral load 83,400   from 05/20/17: CSF WBCs 14, 95% lymphocytes CSF RBCs 3 CSF protein 39 CSF glucose 62 CSF Gram stain showed fungal elements CSF culture positive for cryptococcus neoformans  CSF cryptococcal antigen positive with titer greater than  2560 CSF VDRL nonreactive CSF HSV PCR negative   Radiology:  I have personally and independently reviewed MRI scan of the brain with and without contrast from 05/25/17. I reviewed the radiology interpretation of the study as well. This scan demonstrates scattered areas of restricted diffusion involving the left mid brain, bilateral cerebellum, bilateral medial temporal lobes, bilateral occipital lobes, and high lateral frontal lobes with additional involvement of the basal ganglia laterally. These are consistent with areas of acute ischemic infarction. These areas are associated with possible contrast enhancement. A ventricular catheter is in place via a right frontal approach.   Other diagnostic studies:  Continuous EEG  from 5/27-5/28/18 showed severe diffuse encephalopathy, no epileptiform abnormalities or seizures.   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 This has been complicated by increased ICP and a herniation syndrome which has since resolved. It is also complicated by patchy areas of ischemic infarction likely achievable to vascular infiltration and/or perivascular inflammation of small vessels in the setting of cryptococcal meningitis. Continue amphotericin and flucytosine, appreciate ID assistance. Ventricular drain is in place, appreciate neurosurgery assistance. During raise today to 15 mmHg and is presently draining clear CSF.   2. Acute ischemic infarction: He has numerous scattered areas of ischemia as described above. These are consistent with cryptococcal meningitis with associated vascular involvement versus perivascular inflammation. At this point, management centers on treatment of the underlying infection. There is no role for antiplatelet agents or statins in this context. We'll need to follow his examination to assess for any deficits to triggerable to his strokes. Presently, he is not able to participate well enough to get a  good handle for any such deficits.  3. Acute encephalopathy: This is likely multifactorial with contributions from cryptococcal meningitis, ischemic infarctions, respiratory failure, medication effect, metabolic derangements. He seems to be showing some slow improvement, today able to blink eyes to communicate according to his nurse. Continue with supportive care. Continue to optimize metabolic status as you are. Continue to treat underlying infections as needed. Continue to wean sedation as tolerated, minimizing the use of opiates, benzos, and anything with strong anticholinergic properties.  4. Seizures: He had seizure activity on presentation. No reported prior history of seizure. Continue Keppra. This is likely provoked by his cryptococcal meningitis. Seizure precautions.  5. HIV/AIDS: Current CD4 count is 2. Appreciate ID assistance. Antiretroviral therapy is on hold given acute cryptococcal infection.  No family was present at the time of my visit.   This patient is critically ill and at significant risk of neurological worsening, death and care requires constant monitoring of vital signs, hemodynamics,respiratory and cardiac monitoring, neurological assessment, discussion with family, other specialists and medical decision making of high complexity. A total of 42 minutes of critical care time was spent on this case.   Justin Leavens, MD [facility]

## 2017-05-28 NOTE — Progress Notes (Signed)
eLink Physician-Brief Progress Note Patient Name: Justin Ball DOB: 02/23/1991 MRN: [id number]   Date of Service  05/28/2017  HPI/Events of Note  No output Already had straight cath twice today His risk of uti in nosocomial settting is hirer now fi we keep straight cath Place foley  eICU Interventions       Intervention Category Intermediate Interventions: Oliguria - evaluation and management  Narda Fundora J. 05/28/2017, 3:58 AM

## 2017-05-28 NOTE — Progress Notes (Signed)
Awake, alert Not tracking Pupils reactive Non-purposeful movements primarily RUE EVD in place, function. Clear CSF  - Will raise drain today to 

## 2017-05-28 NOTE — Evaluation (Signed)
Clinical/Bedside Swallow Evaluation Patient Details  Name: Ola SpurrLinston R Richman MRN: [id number] Date of Birth: 11/20/1991  Today's Date: 05/28/2017 Time: SLP Start Time (ACUTE ONLY): 0940 SLP Stop Time (ACUTE ONLY): 0946 SLP Time Calculation (min) (ACUTE ONLY): 6 min  Past Medical History:  Past Medical History:  Diagnosis Date  . Anxiety   . Bipolar 1 disorder (HCC)   . Depression   . HIV infection (HCC)   . Neuromuscular disorder (HCC)   . Scabies   . 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 Substance abuse   . Syphilis    Past Surgical History:  Past Surgical History:  Procedure Laterality Date  . EXAMINATION UNDER ANESTHESIA N/A 12/28/2014   Procedure: EXAM UNDER ANESTHESIA;  Surgeon: Karie SodaSteven Gross, MD;  Location: [facility];  Service: General;  Laterality: N/A;  . FLOOR OF MOUTH BIOPSY N/A 09/16/2016   Procedure: BIOPSY OF ORAL ABCESS;  Surgeon: Newman PiesSu Teoh, MD;  Location: [facility];  Service: ENT;  Laterality: N/A;  . INCISION AND DRAINAGE ABSCESS N/A 09/16/2016   Procedure: INCISION AND DRAINAGE ABSCESS;  Surgeon: Newman PiesSu Teoh, MD;  Location: [facility];  Service: ENT;  Laterality: N/A;  . INCISION AND DRAINAGE PERIRECTAL ABSCESS  08/27/2010   Dr Carolynne Edouardoth  . INCISION AND DRAINAGE PERIRECTAL ABSCESS N/A 12/28/2014   Procedure: IRRIGATION AND DEBRIDEMENT PERIRECTAL ABSCESS;  Surgeon: Karie SodaSteven Gross, MD;  Location: [facility];  Service: General;  Laterality: N/A;  Fistula repair and ablation   HPI:  Pt is a 26 year old man admitted s/p seizure activity having several days of recent headaches. 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 EVD was placed after acute decompensation on 5/28 with decorticate posturing and asymmetric pupils. 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 PMH includes: syphilis, substance abuse, soft palate ulceration, scabies, neuromuscular disorder, HIV/AIDS, depression, bipolar disorder, anxiety   Assessment / Plan /  Recommendation Clinical Impression  Pt has his eyes open but he does not follow my commands this morning, although per chart he does do so intermittently. SLP provided oral care with passive labial opening and teeth clenched, but with no oral movements in response to oral stimulation. A dry spoon was also attempted, but with similar results. Recommend to remain NPO pending further improvements in mentation. SLP Visit Diagnosis: Dysphagia, unspecified (R13.10)    Aspiration Risk  Severe aspiration risk    Diet Recommendation NPO;Alternative means - temporary   Medication Administration: Via alternative means    Other  Recommendations Oral Care Recommendations: Oral care QID Other Recommendations: Have oral suction available   Follow up Recommendations  (tba)      Frequency and Duration min 2x/week  2 weeks       Prognosis Prognosis for Safe Diet Advancement: Good Barriers to Reach Goals: Cognitive deficits      Swallow Study   General HPI: Pt is a 26 year old man admitted s/p seizure activity having several days of recent headaches. 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 EVD was placed after acute decompensation on 5/28 with decorticate posturing and asymmetric pupils. 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 PMH includes: syphilis, substance abuse, soft palate ulceration, scabies, neuromuscular disorder, HIV/AIDS, depression, bipolar disorder, anxiety Type of Study: Bedside Swallow Evaluation Previous Swallow Assessment: none in chart Diet Prior to this Study: NPO;NG Tube Temperature Spikes Noted: No Respiratory Status: Nasal cannula History of Recent Intubation: Yes Length of Intubations (days): 6 days Date extubated: 05/26/17 Behavior/Cognition: Alert;Requires cueing Oral  Cavity Assessment: Other (comment) (difficult to assess, keeps teeth clenched) Oral Care Completed by SLP: Yes Oral Cavity -  Dentition: Adequate natural dentition Self-Feeding Abilities: Total assist Patient Positioning: Upright in bed Baseline Vocal Quality: Not observed Volitional Cough: Cognitively unable to elicit Volitional Swallow: Unable to elicit    Oral/Motor/Sensory Function Overall Oral Motor/Sensory Function:  (doesn't follow commands or move mouth much to assess)   Ice Chips Ice chips: Not tested   Thin Liquid Thin Liquid: Not tested    Nectar Thick Nectar Thick Liquid: Not tested   Honey Thick Honey Thick Liquid: Not tested   Puree Puree: Not tested   Solid   GO   Solid: Not tested        Maxcine Ham 05/28/2017,10:03 AM  Maxcine Ham, M.A. CCC-SLP [phone]

## 2017-05-28 NOTE — Progress Notes (Signed)
Cortrak Tube Team Note:  Consult received to place a Cortrak feeding tube.   A 10 F Cortrak tube was placed in the right nare and secured with a nasal bridle at 65 cm. Per the Cortrak monitor reading the tube tip is in the stomach.   No x-ray is required. RN may begin using tube.   If the tube becomes dislodged please keep the tube and contact the Cortrak team at [email or web address] (password [email or web address]) for replacement.  If after hours and replacement cannot be delayed, place a NG tube and confirm placement with an abdominal x-ray.    Betsey Holidayasey Vassie Kugel MS, RD, LDN Pager #- [phone]

## 2017-05-28 NOTE — Progress Notes (Signed)
[facility]    Date of Admission:  05/20/2017   Total days of antibiotics 9                                                                                     Day 9 L-ampho/flucytosine                                                                                     Day 9  ceftriaxone                                                                                     Day 9 bactrim proph        Day 3 doxycycline   ID: Justin Ball is a 26 y.o. male with CM, strep pneumo bacteremia and bordetella bronchiseptica PNA  Principal Problem:   Seizure (HCC) Active Problems:   HIV (human immunodeficiency virus infection) (HCC)   Rash and nonspecific skin eruption   Insomnia   Bipolar disorder (HCC)   Intersphincteric abscess   AIDS (HCC)   Headache   Meningoencephalitis   Respiratory failure (HCC)   Encephalopathy acute    Subjective: Awake with eyes open lying in bed. Non-verbal. Justin Ball is at his bedside.   Medications:  . acetaminophen  650 mg Oral Q24H  . [START ON 05/31/2017] azithromycin  1,200 mg Per Tube Weekly  . Chlorhexidine Gluconate Cloth  6 each Topical Daily  . diphenhydrAMINE  25 mg Oral Q24H  . flucytosine  1,500 mg Per Tube Q6H  . insulin aspart  2-6 Units Subcutaneous Q4H  . mouth rinse  15 mL Mouth Rinse BID  . metoprolol tartrate  5 mg Intravenous Q8H  . potassium chloride  40 mEq Oral Daily  . sulfamethoxazole-trimethoprim  10 mL Per Tube Daily    Objective: Vital signs in last 24 hours: Temp:  [97.6 F (36.4 C)-99.1 F (37.3 C)] 99.1 F (37.3 C) (06/04 1200) Pulse Rate:  [94-133] 106 (06/04 1200) Resp:  [19-30] 30 (06/04 1300) BP: (118-147)/(69-95) 137/69 (06/04 1300) SpO2:  [96 %-100 %] 100 % (06/04 1200) Weight:  [137 lb 12.6 oz (62.5 kg)] 137 lb 12.6 oz (62.5 kg) (06/04 0400)   General appearance: alert, no distress. Lying in bed eyes open.  Eyes: conjunctivae/corneas clear. PERRL.  Neck: no adenopathy, no  JVD and supple, symmetrical, trachea midline Resp: clear to auscultation bilaterally and diminished bibasilarly Cardio: S1, S2 normal and tachycardic. No murmur GI: soft, non-tender; bowel  sounds normal; no masses,  no organomegaly Extremities: extremities normal, atraumatic, no cyanosis or edema Pulses: 2+ and symmetric Skin: Skin color, texture, turgor normal. No rashes or lesions Lymph nodes: Cervical, supraclavicular, and axillary nodes normal. Neurologic: He opens eyes spontaneously, does not track, does not attempt to communicate, does not follow commands.   Lab Results  Recent Labs  05/27/17 0028 05/28/17 0500  WBC 4.6 4.5  HGB 12.2* 10.9*  HCT 38.0* 33.9*  NA 142 137  K 3.9 4.2  CL 112* 112*  CO2 20* 19*  BUN 30* 36*  CREATININE 1.09 1.02   Liver Panel No results for input(s): PROT, ALBUMIN, AST, ALT, ALKPHOS, BILITOT, BILIDIR, IBILI in the last 72 hours. Sedimentation Rate No results for input(s): ESRSEDRATE in the last 72 hours. C-Reactive Protein No results for input(s): CRP in the last 72 hours.  Microbiology:  Studies/Results: Dg Chest Port 1 View  Result Date: 05/27/2017 CLINICAL DATA:  Assess endotracheal tube and nasogastric tube. EXAM: PORTABLE CHEST 1 VIEW COMPARISON:  05/26/2017 FINDINGS: Endotracheal tube is been removed. Nasogastric tube enters stomach with the tip in the fundus. Heart mediastinal shadows are normal. The lungs are clear. IMPRESSION: No active disease. Electronically Signed   By: Paulina FusiMark  Shogry M.D.   On: 05/27/2017 06:54     Assessment/Plan:  Cryptococcal meningitis c/b scattered infarctions = continue with L-ampho and flucytosine.  - Pharmacy repleting electrolyte losses - Will need to resample the EVD collection system to send CSF for cell count, glu, protein, gram stain, and aerobic culture to see if CM is clearing. - ventricular drain per neurosurgery - raised to 15 today, CSF clear  AKI = - Creatinine 0.61 >> 1.09 >> 1.02 -  Resume 1L pre/post infusion bolus before L-ampho to protect kidneys - Foley placed for retention  Diarrhea = now resolved.  - He had a + GI pathogen panel which does not seem relevant --> At this time, recommend to d/c enteric precautions since he is not having any diarrhea.  Strep pneumoniae bacteremia =  - Plan to treat for 14d with ceftriaxone 2gm IV daily (on day 7/14)  Bordetella pneumonia =  - Continue Doxy treatment for 5-7d (currently on day 3).  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 No need to treat at this time.   Would greatly appreciate if neurosurgery or neurology could please send a sample of CSF tomorrow for reassessment. Thank you! Orders have been entered.   Rexene AlbertsStephanie Stella Encarnacion, MSN, NP-C [facility] Cell: [phone] Pager: [phone]  05/28/2017, 2:22 PM

## 2017-05-28 NOTE — Progress Notes (Signed)
Nutrition Follow-up  DOCUMENTATION CODES:   Non-severe (moderate) malnutrition in context of chronic illness  INTERVENTION:   Increase Vital AF 1.2 to 75 ml/hr (1800 ml/day)  Provides: 2160 kcal, 135 grams protein, and 1459 ml free water   NUTRITION DIAGNOSIS:   Malnutrition (Moderate) related to chronic illness (AIDS) as evidenced by mild depletion of muscle mass, mild depletion of body fat. Ongoing.   GOAL:   Patient will meet greater than or equal to 90% of their needs Progressing.   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  6/2 extubated 6/3 NG tube placed and TF resumed 6/4 failed swallow, cortrak tube placed  EVD: 321 ml output x 24 hours  Medications reviewed and include: D5 NS with 20 mEq KCL @ 50 ml/hr, 40 mEq KCl daily via tube Labs reviewed: PO4 5.0 CBG's: 134-113-119  TF: Vital AF 1.2 @ 65 ml/hr; provides: 1872 kcal, 117 grams protein  Diet Order:  Diet NPO time specified  Skin:  Reviewed, no issues  Last BM:  6/3 medium  Height:   Ht Readings from Last 1 Encounters:  05/20/17 6\' 1"  (1.854 m)    Weight:   Wt Readings from Last 1 Encounters:  05/28/17 137 lb 12.6 oz (62.5 kg)    Ideal Body Weight:  83.6 kg  BMI:  Body mass index is 18.18 kg/m.  Estimated Nutritional Needs:   Kcal:  2100-2300  Protein:  100-125 grams  Fluid:  >/= 2.1 L/day  EDUCATION NEEDS:   No education needs identified at this time  Kendell BaneHeather Maverick Patman RD, LDN, CNSC [phone] Pager [phone] After Hours Pager

## 2017-05-28 NOTE — Progress Notes (Signed)
Pharmacy Consult Note:  Electrolyte Replacement  26 yom currently on treatment for cryptococcal meningitis and strep pneumo bacteremia. Pharmacy consulted by ID to manage electrolyte replacement. SCr up slightly from baseline to 1.02.  K WNL at 4.2 on daily KCl + K+ in IVF Mag down to 1.8  Plan: Continue KCl 40meq daily Suppplement 2gm Mg IV x 1 Monitor K and Mag closely and order additional replacement prn  Lysle Pearlachel Bernis Stecher, PharmD, BCPS 05/28/2017 7:47 AM

## 2017-05-28 NOTE — Progress Notes (Signed)
[facility] Attending Note  ADMISSION DATE:  05/20/2017  CHIEF COMPLAINT:  Seizures, altered MS.   Presenting HPI:  26 y.o. male with male with history ofHIV AIDS with history of noncompliance here with altered mental status. History is limited as patient was pulled out of his car unresponsive. Family did report that he thought the patient had a seizure. On arrival, patient is postictal and unable to provide history. Per review of records, patient was seen 3 days ago in the ER for headache. He was afebrile and otherwise very well-appearing at that time. He had a CT scan which was negative and sent home with supportive care.  Subjective:  Foley placed overnight with patient having urinary retention. Ventriculostomy drain level increased by Neurosurgery this morning.   Review of Systems:  Unable to obtain given altered mental status.   Temp:  [97.6 F (36.4 C)-99.4 F (37.4 C)] 97.6 F (36.4 C) (06/04 0800) Pulse Rate:  [94-138] 117 (06/04 1100) Resp:  [19-30] 19 (06/04 1100) BP: (118-147)/(67-95) 132/81 (06/04 1100) SpO2:  [96 %-100 %] 100 % (06/04 1100) Weight:  [137 lb 12.6 oz (62.5 kg)] 137 lb 12.6 oz (62.5 kg) (06/04 0400)  General: Grandmother at bedside. No distress. Integument:  Warm & dry. No rash on exposed skin. HEENT:  Moist mucus memebranes. No scleral icterus. Right parietal ventriculostomy in place. Neurological:  Pupils symmetric. Opens eyes with voice and tactile stimulus but not following commands. Musculoskeletal:  No joint effusion or erythema appreciated. Symmetric muscle bulk. Pulmonary:  Clear breath sounds bilaterally. Normal work of breathing on nasal cannula.  Cardiovascular:  Regular rhythm. No appreciable JVD. Normal S1 & S2. Telemetry:  Sinus tachycardic rhythm. Abdomen:  Soft. Nondistended. Normoactive bowel sounds.  LINES/TUBES: OETT 5/27 - 6/3 OGT 5/27 - 6/3 NGT >>> Right Ventriculostomy 5/28 >>> LUE TL PICC 6/3 >>> Foley 6/4  >>> PIV  CBC Latest Ref Rng & Units 05/28/2017 05/27/2017 05/24/2017  WBC 4.0 - 10.5 K/uL 4.5 4.6 4.9  Hemoglobin 13.0 - 17.0 g/dL 10.9(L) 12.2(L) 11.3(L)  Hematocrit 39.0 - 52.0 % 33.9(L) 38.0(L) 34.3(L)  Platelets 150 - 400 K/uL 298 326 273    BMP Latest Ref Rng & Units 05/28/2017 05/27/2017 05/26/2017  Glucose 65 - 99 mg/dL 106(H) 115(H) 137(H)  BUN 6 - 20 mg/dL 36(H) 30(H) 22(H)  Creatinine 0.61 - 1.24 mg/dL 1.02 1.09 0.77  BUN/Creat Ratio 6 - 22 Ratio - - -  Sodium 135 - 145 mmol/L 137 142 140  Potassium 3.5 - 5.1 mmol/L 4.2 3.9 4.0  Chloride 101 - 111 mmol/L 112(H) 112(H) 111  CO2 22 - 32 mmol/L 19(L) 20(L) 21(L)  Calcium 8.9 - 10.3 mg/dL 9.1 9.5 9.3    Hepatic Function Latest Ref Rng & Units 05/23/2017 05/20/2017 05/20/2017  Total Protein 6.5 - 8.1 g/dL 7.9 8.1 9.0(H)  Albumin 3.5 - 5.0 g/dL 2.2(L) 2.6(L) 3.0(L)  AST 15 - 41 U/L 27 44(H) 59(H)  ALT 17 - 63 U/L 17 21 <5(L)  Alk Phosphatase 38 - 126 U/L 126 128(H) 140(H)  Total Bilirubin 0.3 - 1.2 mg/dL 0.8 0.7 0.8  Bilirubin, Direct 0.1 - 0.5 mg/dL 0.3 - -    IMAGING/STUDIES: MRI BRAIN W/O 6/1: IMPRESSION: 1. Multifocal cortical diffusion restriction involving both cerebellar hemispheres and the bilateral temporal, occipital and frontal lobes, as well as the deep gray nuclei. 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 Postictal changes could have a similar appearance, but the pattern is more typical  of hypoxic ischemic injury. 2. Areas of subarachnoid or leptomeningeal contrast enhancement at the sites of diffusion restriction may be secondary to blood-brain barrier breakdown in the setting of a subacute ischemic event. In the context of HIV, however, an infectious meningitis should also be considered. 3. Small amount of blood adjacent to the right frontal approach extraventricular drain.  PORT CXR 6/3:  No active disease.  MICROBIOLOGY: MRSA PCR 5/27:  Negative  Blood Cultures x2 5/27:  1/2 Bottles  Positive Streptococcus pneumoniae  Blood Fungal Culture 5/27:  Negative  CSF Culture 5/27:  Cryptococcus neoformans  Tracheal Aspirate Culture 5/28:  MODERATE BORDETELLA BRONCHISEPTICA  Blood Cultures x2 5/29:  Negative  Gastrointestinal PCR 5/31:  Campylobacter / E coli (shiga-like toxin producing) / Giardia lamblia / Sapovirus   ANTIBIOTICS: Ambisome 5/27 >>> Flucytosine 5/27 >>> Rocephin 5/27 >>> Bactrim 5/27 >>> Azithromycin qweek 5/31 >>> Doxycycline 6/2 >>>  SIGNIFICANT EVENTS: 05/27 - Admit 05/28 - Urgent right ventriculostomy placed for acute decompensation with fixed right pupil   ASSESSMENT/PLAN:  26 y.o. 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 Mental status continues to be tenuous. Appreciate assistance from ID and neurosurgery. Continuing to adjust patient's ventriculostomy.  1. Acute encephalopathy: Multifactorial from underlying bipolar disorder as well as, pulmonary no encephalitis. Limiting sedating medications. Discontinuing fentanyl. 2. Cryptococcal meningoencephalitis: Continuing treatment as per ID recommendations. Ventriculostomy in place with management per neurosurgery. 3. Streptococcus pneumoniae bacteremia: Currently on day #9/14 of treatment with Rocephin. 4. Bordetella pneumonia: Treating with doxycycline day #3/7. Continuing droplet isolation until treatment complete. 5. Acute hypoxic respiratory failure: Weaning FiO2 postextubation. Continuous pulse oximetry monitoring. Incentive spirometry for pulmonary toilette as mental status allows. 6. Chronic hepatitis B infection: Normal LFTs despite high viral load. Holding on treatment as per ID recommendations. 7. Diarrhea: Resolved. Gastrointestinal pathogen panel with multiple positives of unclear clinical significance. 8. Acute renal failure: Stable. Continuing to monitor urine output along with electrolytes and renal  function daily. 9. Anemia: No signs of active bleeding. Trending cell counts daily with CBC. 10. HIV/AIDS: Currently holding treatment as per ID recommendations. Continuing prophylaxis with azithromycin and Bactrim.  Prophylaxis:  SCDs. D/C Pepcid IV. Diet:  NPO. Speech therapy following. Continuing tube feeding. Code Status:  Full Code per previous physician discussions. Disposition:  Remains in the ICU while ventriculostomy drain is being adjusted. Family Update:  Grandmother updated at bedside during rounds.   I have personally spent a total of 36 minutes of critical care time today caring for the patient, updating family at bedside, & reviewing the patient's electronic medical record.  Sonia Baller Ashok Cordia, M.D. [facility] Pager:  [phone] After 3pm or if no response, call [phone] 11:27 AM 05/28/17

## 2017-05-29 LAB — RENAL FUNCTION PANEL
Albumin: 2.4 g/dL — ABNORMAL LOW (ref 3.5–5.0)
Anion gap: 7 (ref 5–15)
BUN: 26 mg/dL — ABNORMAL HIGH (ref 6–20)
CALCIUM: 9 mg/dL (ref 8.9–10.3)
CO2: 21 mmol/L — AB (ref 22–32)
Chloride: 107 mmol/L (ref 101–111)
Creatinine, Ser: 0.81 mg/dL (ref 0.61–1.24)
GFR calc non Af Amer: >60 mL/min (ref >60)
GLUCOSE: 114 mg/dL — AB (ref 65–99)
PHOSPHORUS: 4.7 mg/dL — AB (ref 2.5–4.6)
POTASSIUM: 4.1 mmol/L (ref 3.5–5.1)
Sodium: 135 mmol/L (ref 135–145)

## 2017-05-29 LAB — GLUCOSE, CAPILLARY
GLUCOSE-CAPILLARY: 110 mg/dL — AB (ref 65–99)
GLUCOSE-CAPILLARY: 106 mg/dL — AB (ref 65–99)
GLUCOSE-CAPILLARY: 107 mg/dL — AB (ref 65–99)
GLUCOSE-CAPILLARY: 93 mg/dL (ref 65–99)
Glucose-Capillary: 114 mg/dL — ABNORMAL HIGH (ref 65–99)
Glucose-Capillary: 90 mg/dL (ref 65–99)

## 2017-05-29 LAB — CSF CELL COUNT WITH DIFFERENTIAL
RBC Count, CSF: 118 /mm3 — ABNORMAL HIGH
TUBE #: 1
WBC, CSF: 0 /mm3 (ref 0–5)

## 2017-05-29 LAB — CBC WITH DIFFERENTIAL/PLATELET
BASOS PCT: 0 %
Basophils Absolute: 0 10*3/uL (ref 0.0–0.1)
EOS PCT: 7 %
Eosinophils Absolute: 0.3 10*3/uL (ref 0.0–0.7)
HEMATOCRIT: 32.5 % — AB (ref 39.0–52.0)
HEMOGLOBIN: 10.1 g/dL — AB (ref 13.0–17.0)
LYMPHS ABS: 0.5 10*3/uL — AB (ref 0.7–4.0)
LYMPHS PCT: 14 %
MCH: 28.9 pg (ref 26.0–34.0)
MCHC: 31.1 g/dL (ref 30.0–36.0)
MCV: 93.1 fL (ref 78.0–100.0)
MONOS PCT: 6 %
Monocytes Absolute: 0.2 10*3/uL (ref 0.1–1.0)
NEUTROS ABS: 2.7 10*3/uL (ref 1.7–7.7)
Neutrophils Relative %: 73 %
Platelets: 294 10*3/uL (ref 150–400)
RBC: 3.49 MIL/uL — ABNORMAL LOW (ref 4.22–5.81)
RDW: 14.3 % (ref 11.5–15.5)
WBC: 3.7 10*3/uL — ABNORMAL LOW (ref 4.0–10.5)

## 2017-05-29 LAB — PROTEIN AND GLUCOSE, CSF
Glucose, CSF: 70 mg/dL (ref 40–70)
Total  Protein, CSF: 22 mg/dL (ref 15–45)

## 2017-05-29 LAB — MAGNESIUM: Magnesium: 1.3 mg/dL — ABNORMAL LOW (ref 1.7–2.4)

## 2017-05-29 MED ORDER — MAGNESIUM SULFATE 4 GM/100ML IV SOLN
4.0000 g | Freq: Once | INTRAVENOUS | Status: AC
Start: 2017-05-29 — End: 2017-05-29
  Administered 2017-05-29: 4 g via INTRAVENOUS
  Filled 2017-05-29: qty 100

## 2017-05-29 NOTE — Progress Notes (Signed)
[facility]    Date of Admission:  05/20/2017   Total days of antibiotics 10 Day 10L-ampho/flucytosine Day 10 ceftriaxone Day 10bactrim proph                                                                                     Day 4 doxycycline   Justin Ball is a 26 y.o. male with CM, strep pneumo bacteremia and bordetella bronchiseptica PNA  Principal Problem:   Seizure (HCC) Active Problems:   HIV (human immunodeficiency virus infection) (HCC)   Rash and nonspecific skin eruption   Insomnia   Bipolar disorder (HCC)   Intersphincteric abscess   AIDS (HCC)   Headache   Meningoencephalitis   Respiratory failure (HCC)   Encephalopathy acute    Subjective: Lying in bed. Opens eyes only. No attempt to communicate.   Medications:  . acetaminophen  650 mg Oral Q24H  . [START ON 05/31/2017] azithromycin  1,200 mg Per Tube Weekly  . chlorhexidine  15 mL Mouth Rinse BID  . Chlorhexidine Gluconate Cloth  6 each Topical Daily  . diphenhydrAMINE  25 mg Oral Q24H  . flucytosine  1,500 mg Per Tube Q6H  . insulin aspart  2-6 Units Subcutaneous Q4H  . mouth rinse  15 mL Mouth Rinse BID  . mouth rinse  15 mL Mouth Rinse q12n4p  . metoprolol tartrate  5 mg Intravenous Q8H  . potassium chloride  40 mEq Oral Daily  . sulfamethoxazole-trimethoprim  10 mL Per Tube Daily    Objective: Vital signs in last 24 hours: Temp:  [97.7 F (36.5 C)-99.1 F (37.3 C)] 98.8 F (37.1 C) (06/05 0800) Pulse Rate:  [86-121] 104 (06/05 0900) Resp:  [14-30] 16 (06/05 0900) BP: (110-137)/(52-84) 127/77 (06/05 0900) SpO2:  [100 %] 100 % (06/05 0900)  General appearance: Lying in bed asleep, no distress. Safety mittens in  place.  Eyes: conjunctivae/corneas clear. PERRL.  Neck: no adenopathy, no JVD and supple, symmetrical, trachea midline Resp: clear to auscultation bilaterally and diminished bibasilarly Cardio: S1, S2 normal and tachycardic. No murmur GI: soft, non-tender; bowel sounds normal; no masses,  no organomegaly Extremities: extremities normal, atraumatic, no cyanosis or edema Pulses: 2+ and symmetric Skin: Skin color, texture, turgor normal.  Lymph nodes: Cervical, supraclavicular, and axillary nodes normal. Neurologic: He opens eyes spontaneously, does not track, does not attempt to communicate, does not follow commands.   Lab Results  Recent Labs  05/28/17 0500 05/29/17 0436  WBC 4.5 3.7*  HGB 10.9* 10.1*  HCT 33.9* 32.5*  NA 137 135  K 4.2 4.1  CL 112* 107  CO2 19* 21*  BUN 36* 26*  CREATININE 1.02 0.81   Liver Panel  Recent Labs  05/29/17 0436  ALBUMIN 2.4*   Sedimentation Rate No results for input(s): ESRSEDRATE in the last 72 hours. C-Reactive Protein No results for input(s): CRP in the last 72 hours.  Microbiology:  Studies/Results: No results found.   Assessment/Plan: Cryptococcal meningitis c/b scattered infarctions =  - Continue with L-ampho and flucytosine (day 10/14) - Pharmacy/PCCM repleting electrolyte losses --> thank you! - Will need to resample the EVD  collection system to send CSF for cell count, glu, protein, gram stain, and aerobic culture to see if CM is clearing. - Ventricular drain per neurosurgery - raised to 15 yesterday, CSF clear.  Encephalopathy =  - Neuro status still uncertain at this time. Appreciate neurology and neurosurgery's care.   AKI = - Creatinine 0.61 >> 1.09 >> 1.02 >> 0.88 - Continue 1L pre/post infusion bolus before L-ampho to protect kidneys - Foley placed for retention  Diarrhea = now resolved.  - He had a + GI pathogen panel which does not seem relevant --> At this time, recommend to d/c enteric precautions since  he is not having any diarrhea.  Strep pneumoniae bacteremia =  - Plan to treat for 14d with ceftriaxone 2gm IV daily (on day 7/14)  Bordetella pneumonia =  - Continue Doxy treatment for 5-7d (currently on day 4).  - Droplet can d/c once 5 days of treatment in place - He has been extubated as of 6/2 and protecting his airway  HIV =  - hold on art while on CM treatment, for at least 5 wk - OI proph >> continue with daily bactrim and weekly azithromycin  Chronic hep b =  - High viral load but AST/ALT are normal. No need to treat at this time.   Would greatly appreciate if neurosurgery or neurology could please send a sample of CSF today for reassessment. (Orders have been entered).   Rexene Alberts, MSN, NP-C [facility] Cell: [phone] Pager: [phone]  05/29/2017, 10:45 AM

## 2017-05-29 NOTE — Progress Notes (Signed)
eLink Physician-Brief Progress Note Patient Name: Justin Ball DOB: 04/05/1991 MRN: [id number]   Date of Service  05/29/2017  HPI/Events of Note  Mag supp  eICU Interventions       Intervention Category Intermediate Interventions: Electrolyte abnormality - evaluation and management  Nelda BucksFEINSTEIN,DANIEL J. 05/29/2017, 5:45 AM

## 2017-05-29 NOTE — Progress Notes (Signed)
Pt awake, alert. Not following commands or verbal for me. Moves all extremities spontaneously. EVD in place, draining straw colored CSF. - Raised EVD to today - CSF sampled for study per ID/neurology

## 2017-05-29 NOTE — Progress Notes (Signed)
  Speech Language Pathology Treatment: Dysphagia  Patient Details Name: Justin Ball MRN: [id number] DOB: 02/27/1991 Today's Date: 05/29/2017 Time: [id number] SLP Time Calculation (min) (ACUTE ONLY): 9 min  Assessment / Plan / Recommendation Clinical Impression  Pt is minimally more alert and responsive to thermal/tactile stimulation applied to his lips. He opens his eyes when his name is called and will localize to therapist. He had small amounts of mandibular activation during oral care, biting down on swab each time it passed over his tongue. When an ice chip applied to his lip he would open his eyes and furrow his brow, then bring his hand up to wipe his mouth. Unfortunately, he did not have any attempts at oral acceptance when ice chips were offered by spoon. He very quickly fell back asleep. Recommend to remain NPO at this time.   HPI HPI: Pt is a 26 year old man admitted s/p seizure activity having several days of recent headaches. 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 EVD was placed after acute decompensation on 5/28 with decorticate posturing and asymmetric pupils. 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 PMH includes: syphilis, substance abuse, soft palate ulceration, scabies, neuromuscular disorder, HIV/AIDS, depression, bipolar disorder, anxiety      SLP Plan  Continue with current plan of care       Recommendations  Diet recommendations: NPO Medication Administration: Via alternative means                Oral Care Recommendations: Oral care QID Follow up Recommendations:  (tba) SLP Visit Diagnosis: Dysphagia, unspecified (R13.10) Plan: Continue with current plan of care       GO                Maxcine Hamaiewonsky, Fizza Scales 05/29/2017, 11:01 AM  Maxcine HamLaura Paiewonsky, M.A. CCC-SLP [phone]

## 2017-05-29 NOTE — Progress Notes (Signed)
Neurology Progress Note  Subjective: No major 24 hour events. He is verbalizing this morning, answering yes and no intermittently but still with a paucity of verbal output. He is still very encephalopathic. He indicated that he has a headache but no pain anywhere else but did not answer any other questions so full ROS could not be performed.   Medications reviewed and reconciled.   Pertinent meds: Amphotericin B 250 mg every 24 hours Azithromycin 1200 mg weekly Ceftriaxone 2 g every 24 hours Doxycycline 100 mg every 12 hours Flucytosine 1500 mg every 6 hours Keppra 1000 g every 12 hours Bactrim 10 mL daily  Current Meds:   Current Facility-Administered Medications:  .  0.9 %  sodium chloride infusion, 250 mL, Intravenous, PRN, Wilber Oliphant, MD, Last Rate: 10 mL/hr at 05/28/17 1800, 250 mL at 05/28/17 1800 .  acetaminophen (TYLENOL) tablet 650 mg, 650 mg, Oral, Q4H PRN, Wilber Oliphant, MD .  acetaminophen (TYLENOL) tablet 650 mg, 650 mg, Oral, Q24H, [name], Lisette Grinder, MD, 650 mg at 05/28/17 1630 .  albuterol (PROVENTIL) (2.5 MG/3ML) 0.083% nebulizer solution 2.5 mg, 2.5 mg, Nebulization, Q2H PRN, Wilber Oliphant, MD .  amphotericin B liposome (AMBISOME) 250 mg in dextrose 5 % 500 mL IVPB, 4 mg/kg, Intravenous, Q24H, Shaune Pollack, MD, Stopped at 05/28/17 2014 .  [START ON 05/31/2017] azithromycin (ZITHROMAX) 200 MG/5ML suspension 1,200 mg, 1,200 mg, Per Tube, Weekly, Judyann Munson, MD .  cefTRIAXone (ROCEPHIN) 2 g in dextrose 5 % 50 mL IVPB, 2 g, Intravenous, Q24H, Judyann Munson, MD, Stopped at 05/29/17 1610 .  chlorhexidine (PERIDEX) 0.12 % solution 15 mL, 15 mL, Mouth Rinse, BID, Roslynn Amble, MD, 15 mL at 05/29/17 0908 .  Chlorhexidine Gluconate Cloth 2 % PADS 6 each, 6 each, Topical, Daily, Leslye Peer, MD, 6 each at 05/28/17 1610 .  dextrose 5 % and 0.45 % NaCl with KCl 20 mEq/L infusion, , Intravenous, Continuous, Panchal, Amar, MD, Last Rate: 50 mL/hr at 05/29/17  0800 .  diphenhydrAMINE (BENADRYL) capsule 25 mg, 25 mg, Oral, Q24H, [name], Lisette Grinder, MD, 25 mg at 05/28/17 1742 .  doxycycline (VIBRAMYCIN) 100 mg in dextrose 5 % 250 mL IVPB, 100 mg, Intravenous, Q12H, Judyann Munson, MD, Stopped at 05/29/17 0232 .  feeding supplement (VITAL AF 1.2 CAL) liquid 1,000 mL, 1,000 mL, Per Tube, Continuous, Roslynn Amble, MD, Last Rate: 75 mL/hr at 05/29/17 0800, 1,000 mL at 05/29/17 0800 .  flucytosine (ANCOBON) capsule 1,500 mg, 1,500 mg, Per Tube, Q6H, Sood, Vineet, MD, 1,500 mg at 05/29/17 0900 .  insulin aspart (novoLOG) injection 2-6 Units, 2-6 Units, Subcutaneous, Q4H, Wilber Oliphant, MD, Stopped at 05/29/17 0411 .  levETIRAcetam (KEPPRA) 1,000 mg in sodium chloride 0.9 % 100 mL IVPB, 1,000 mg, Intravenous, Q12H, Rejeana Brock, MD, Stopped at 05/28/17 2106 .  MEDLINE mouth rinse, 15 mL, Mouth Rinse, BID, Byrum, Les Pou, MD, 15 mL at 05/28/17 2000 .  MEDLINE mouth rinse, 15 mL, Mouth Rinse, q12n4p, Nestor, Donna Christen, MD .  metoprolol tartrate (LOPRESSOR) injection 2.5-5 mg, 2.5-5 mg, Intravenous, Q3H PRN, Kalman Shan, MD, 5 mg at 05/29/17 0240 .  metoprolol tartrate (LOPRESSOR) injection 5 mg, 5 mg, Intravenous, Q8H, Deterding, Dorise Hiss, MD, 5 mg at 05/29/17 0548 .  ondansetron (ZOFRAN) injection 4 mg, 4 mg, Intravenous, Q6H PRN, Panchal, Amar, MD .  potassium chloride 20 MEQ/15ML (10%) solution 40 mEq, 40 mEq, Oral, Daily, Rumbarger, Faye Ramsay, RPH, 40 mEq at 05/28/17 0907 .  sodium chloride 0.9 % bolus 1,000 mL, 1,000 mL, Intravenous, Q24H, Dixon, Gomez Cleverly, NP, 1,000 mL at 05/28/17 2034 .  sodium chloride 0.9 % bolus 1,000 mL, 1,000 mL, Intravenous, Q24H, Dixon, Stephanie N, NP, 1,000 mL at 05/28/17 1723 .  sodium chloride flush (NS) 0.9 % injection 10-40 mL, 10-40 mL, Intracatheter, PRN, Leslye Peer, MD .  sulfamethoxazole-trimethoprim (BACTRIM,SEPTRA) 200-40 MG/5ML suspension 10 mL, 10 mL, Per Tube, Daily, Coralyn Helling, MD,  10 mL at 05/29/17 0909  Objective:  Temp:  [97.7 F (36.5 C)-99.1 F (37.3 C)] 98.8 F (37.1 C) (06/05 0800) Pulse Rate:  [86-121] 104 (06/05 0900) Resp:  [14-30] 16 (06/05 0900) BP: (110-137)/(52-84) 127/77 (06/05 0900) SpO2:  [100 %] 100 % (06/05 0900)  General: WD AA man lying in bed. He is lying with his eyes open and he will fix and track. He said yes when his RN asked if he had a headache and shook his head when I asked if he had pain anywhere else but otherwise did not communicate. He did not follow any commands.  HEENT: NGT in place. Ventricular cathter in place R frontal region. Sclerae are anicteric. There is no conjunctival injection.  CV: Regular, tachy, no obvious murmur. Carotid pulses are 2+ and symmetric with no bruits. Distal pulses 2+ and symmetric.  Lungs: CTAB on anterior auscultation.  Extremities: No C/C/E. Neuro: MS: As noted above.  CN: Pupils are equal and reactive from 3-->2 mm bilaterally. He blinks to threat. Eyes are conjugate, no forced deviation and no nystagmus. His face appears to be grossly symmetric. The remainder of his cranial nerve exam is limited by inability to participate with the exam.  Motor: Thin bulk diffusely. He has gegenhalten paratonia throughout. He does not participate with confrontational strength testing. He has some spontaneous movement of the arms and legs with at least 4-/5 strength throughout. No tremor or other abnormal movements are observed.  Sensation: He grimaces and withdraws from nailbed pressure x4.  DTRs: 3+, symmetric. Toes are mute bilaterally.  Coordination/gait:  Unable to assess as he does not participate with the exam.    Labs: Lab Results  Component Value Date   WBC 3.7 (L) 05/29/2017   HGB 10.1 (L) 05/29/2017   HCT 32.5 (L) 05/29/2017   PLT 294 05/29/2017   GLUCOSE 114 (H) 05/29/2017   CHOL 125 09/28/2016   TRIG 150 (H) 05/26/2017   HDL 31 (L) 09/28/2016   LDLCALC 70 09/28/2016   ALT 17 05/23/2017   AST  27 05/23/2017   NA 135 05/29/2017   K 4.1 05/29/2017   CL 107 05/29/2017   CREATININE 0.81 05/29/2017   BUN 26 (H) 05/29/2017   CO2 21 (L) 05/29/2017   INR 1.20 05/20/2017   CBC Latest Ref Rng & Units 05/29/2017 05/28/2017 05/27/2017  WBC 4.0 - 10.5 K/uL 3.7(L) 4.5 4.6  Hemoglobin 13.0 - 17.0 g/dL 10.1(L) 10.9(L) 12.2(L)  Hematocrit 39.0 - 52.0 % 32.5(L) 33.9(L) 38.0(L)  Platelets 150 - 400 K/uL 294 298 326    No results found for: HGBA1C Lab Results  Component Value Date   ALT 17 05/23/2017   AST 27 05/23/2017   ALKPHOS 126 05/23/2017   BILITOT 0.8 05/23/2017   CD4 count 2 HIV viral load 83,400   from 05/20/17: CSF WBCs 14, 95% lymphocytes CSF RBCs 3 CSF protein 39 CSF glucose 62 CSF Gram stain showed fungal elements CSF culture positive for cryptococcus neoformans  CSF cryptococcal antigen positive with titer greater than  2560 CSF VDRL nonreactive CSF HSV PCR negative   Radiology:  There is no new neuroimaging.   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 This has been complicated by increased ICP and early herniation which resolved with mannitol and CSF diversion. It is also complicated by diffuse patchy areas of ischemic infarction. Continue amphotericin and flucytosine, appreciate ID assistance. Ventricular drain is in place, appreciate neurosurgery assistance. Agree with repeating CSF analysis, will defer sampling of drain to neurosurgery.   2. Acute ischemic infarction: He has numerous scattered areas of ischemia on MRI. These are consistent with cryptococcal meningitis with associated vascular involvement versus perivascular inflammation. Continue antifungals. There is no role for antiplatelet agents or statins in this context.   3. Acute encephalopathy: This is likely multifactorial with contributions from cryptococcal meningitis, ischemic infarctions, respiratory failure, medication effect, metabolic derangements. He  is showing some slow improvement. Continue with supportive care. Continue to optimize metabolic status as you are. Continue to treat underlying infections as needed. Continue to wean sedation as tolerated, minimizing the use of opiates, benzos, and anything with strong anticholinergic properties.  4. Seizures: He had seizure activity on presentation. No reported prior history of seizure. Continue Keppra. This is likely provoked by his cryptococcal meningitis. Seizure precautions.  5. HIV/AIDS: Current CD4 count is 2. Appreciate ID assistance. Antiretroviral therapy is on hold given acute cryptococcal infection.  No family was present at the time of my visit.   Rhona Leavensimothy Oster, MD [facility]

## 2017-05-29 NOTE — Progress Notes (Signed)
[facility] Attending Note  ADMISSION DATE:  05/20/2017  CHIEF COMPLAINT:  Seizures, altered MS.   Presenting HPI:  26 y.o. Ball with Ball with history ofHIV AIDS with history of noncompliance here with altered mental status. History is limited as patient was pulled out of his car unresponsive. Family did report that he thought the patient had a seizure. On arrival, patient is postictal and unable to provide history. Per review of records, patient was seen 3 days ago in the ER for headache. He was afebrile and otherwise very well-appearing at that time. He had a CT scan which was negative and sent home with supportive care.  Subjective:  Magnesium replaced this morning. No acute events overnight. Increased output from his ventriculostomy.   Review of Systems:  Unable to obtain given altered mental status.   Temp:  [97.7 F (36.5 C)-99.1 F (37.3 C)] 97.7 F (36.5 C) (06/05 0400) Pulse Rate:  [86-121] 99 (06/05 0800) Resp:  [14-30] 18 (06/05 0800) BP: (110-140)/(52-84) 114/68 (06/05 0800) SpO2:  [100 %] 100 % (06/05 0800)  General:  No distress. No family at bedside.  Integument:  Warm & dry. No rash on exposed skin. Right parietal scalp ventriculostomy in place. Extremities:  No cyanosis or clubbing.  HEENT:  Moist mucus membranes. No scleral injection or icterus. Pupils symmetric. Cardiovascular:  Regular rate and rhythm. No edema. No appreciable JVD.  Pulmonary:  Normal work of breathing on room air. Overall clear with auscultation bilaterally. Abdomen: Soft. Normal bowel sounds. Nondistended. Neurological: Spontaneously moving all 4 extremities. Does not follow commands or respond appropriately.  LINES/TUBES: OETT 5/27 - 6/3 OGT 5/27 - 6/3 NGT >>> Right Ventriculostomy 5/28 >>> LUE TL PICC 6/3 >>> Foley 6/4 >>> PIV  CBC Latest Ref Rng & Units 05/29/2017 05/28/2017 05/27/2017  WBC 4.0 - 10.5 K/uL 3.7(L) 4.5 4.6  Hemoglobin 13.0 - 17.0 g/dL 10.1(L) 10.9(L)  Justin.2(L)  Hematocrit 39.0 - 52.0 % 32.5(L) 33.9(L) 38.0(L)  Platelets 150 - 400 K/uL 294 298 326    BMP Latest Ref Rng & Units 05/29/2017 05/28/2017 05/27/2017  Glucose 65 - 99 mg/dL 114(H) 106(H) 115(H)  BUN 6 - 20 mg/dL 26(H) 36(H) 30(H)  Creatinine 0.61 - 1.24 mg/dL 0.81 1.02 1.09  BUN/Creat Ratio 6 - 22 Ratio - - -  Sodium 135 - 145 mmol/L 135 137 142  Potassium 3.5 - 5.1 mmol/L 4.1 4.2 3.9  Chloride 101 - 111 mmol/L 107 112(H) 112(H)  CO2 22 - 32 mmol/L 21(L) 19(L) 20(L)  Calcium 8.9 - 10.3 mg/dL 9.0 9.1 9.5    Hepatic Function Latest Ref Rng & Units 05/29/2017 05/23/2017 05/20/2017  Total Protein 6.5 - 8.1 g/dL - 7.9 8.1  Albumin 3.5 - 5.0 g/dL 2.4(L) 2.2(L) 2.6(L)  AST 15 - 41 U/L - 27 44(H)  ALT 17 - 63 U/L - 17 21  Alk Phosphatase 38 - 126 U/L - 126 128(H)  Total Bilirubin 0.3 - 1.2 mg/dL - 0.8 0.7  Bilirubin, Direct 0.1 - 0.5 mg/dL - 0.3 -    IMAGING/STUDIES: MRI BRAIN W/O 6/1: IMPRESSION: 1. Multifocal cortical diffusion restriction involving both cerebellar hemispheres and the bilateral temporal, occipital and frontal lobes, as well as the deep gray nuclei. 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 Postictal changes could have a similar appearance, but the pattern is more typical of hypoxic ischemic injury. 2. Areas of subarachnoid or leptomeningeal contrast enhancement at the sites of diffusion restriction may be secondary to blood-brain  barrier breakdown in the setting of a subacute ischemic event. In the context of HIV, however, an infectious meningitis should also be considered. 3. Small amount of blood adjacent to the right frontal approach extraventricular drain.  PORT CXR 6/3:  No active disease.  MICROBIOLOGY: MRSA PCR 5/27:  Negative  Blood Cultures x2 5/27:  1/2 Bottles Positive Streptococcus pneumoniae  Blood Fungal Culture 5/27:  Negative  CSF Culture 5/27:  Cryptococcus neoformans  Tracheal Aspirate Culture 5/28:  MODERATE BORDETELLA  BRONCHISEPTICA  Blood Cultures x2 5/29:  Negative  Gastrointestinal PCR 5/31:  Campylobacter / E coli (shiga-like toxin producing) / Giardia lamblia / Sapovirus   ANTIBIOTICS: Ambisome 5/27 >>> Flucytosine 5/27 >>> Rocephin 5/27 >>> Bactrim 5/27 >>> Azithromycin qweek 5/31 >>> Doxycycline 6/2 >>>  SIGNIFICANT EVENTS: 05/27 - Admit 05/28 - Urgent right ventriculostomy placed for acute decompensation with fixed right pupil   ASSESSMENT/PLAN:  26 y.o. 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 Continuing to have encephalopathy. Appreciate assistance from ID and neurosurgery.   1. Acute encephalopathy: Secondary to bipolar disorder as well as meningoencephalitis. Limiting sedatives. 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 Ventriculostomy management per neurosurgery. 3. Streptococcus pneumoniae bacteremia: Currently on day #10/14 of treatment with Rocephin. 4. Bordetella pneumonia: Continuing on treatment day #4/7. Continuing droplet isolation per ID recommendations. 5. Acute hypoxic respiratory failure: Resolved. Continuous pulse oximetry monitoring. 6. Chronic hepatitis B infection: Holding on treatment given normal LFTs despite high viral load. 7. Diarrhea: Resolved. Positive gastrointestinal pathogen panel of unclear significance. 8. Acute renal failure: Stable. Continuing bolus IV fluid for renal protection with angina. 9. Anemia: No evidence of active bleeding. Continuing to trend cell counts daily with CBC. 10. Hypomagnesemia: Status post magnesium sulfate IV. Repeat magnesium level in AM 11. HIV/AIDS: Currently holding on treatment as per ID recommendations. Continuing Bactrim and azithromycin for prophylaxis.   Prophylaxis:  SCDs.  Diet:  NPO. Speech therapy following. Continuing tube feeding. Code Status:  Full Code per previous physician discussions. Disposition:  Remains in the ICU while  ventriculostomy drain is being adjusted. Family Update:  Grandmother updated at bedside during rounds.   I have spent a total of 37 minutes of time today caring for the patient, reviewing the patient's electronic medical record, and with more than 50% of that time spent coordinating care with the patient as well as reviewing the continuing plan of care with the patient's nurse at bedside.  Sonia Baller Ashok Cordia, M.D. [facility] Pager:  [phone] After 3pm or if no response, call [phone] 9:02 AM 05/29/17

## 2017-05-30 ENCOUNTER — Encounter (HOSPITAL_COMMUNITY): Payer: Self-pay

## 2017-05-30 LAB — CBC WITH DIFFERENTIAL/PLATELET
BASOS ABS: 0 10*3/uL (ref 0.0–0.1)
BASOS PCT: 1 %
EOS PCT: 8 %
Eosinophils Absolute: 0.2 10*3/uL (ref 0.0–0.7)
HCT: 26.3 % — ABNORMAL LOW (ref 39.0–52.0)
Hemoglobin: 8.2 g/dL — ABNORMAL LOW (ref 13.0–17.0)
Lymphocytes Relative: 15 %
Lymphs Abs: 0.4 10*3/uL — ABNORMAL LOW (ref 0.7–4.0)
MCH: 29.3 pg (ref 26.0–34.0)
MCHC: 31.2 g/dL (ref 30.0–36.0)
MCV: 93.9 fL (ref 78.0–100.0)
Monocytes Absolute: 0.2 10*3/uL (ref 0.1–1.0)
Monocytes Relative: 8 %
Neutro Abs: 1.7 10*3/uL (ref 1.7–7.7)
Neutrophils Relative %: 68 %
PLATELETS: 215 10*3/uL (ref 150–400)
RBC: 2.8 MIL/uL — AB (ref 4.22–5.81)
RDW: 14.3 % (ref 11.5–15.5)
WBC: 2.5 10*3/uL — AB (ref 4.0–10.5)

## 2017-05-30 LAB — RENAL FUNCTION PANEL
ALBUMIN: 2.5 g/dL — AB (ref 3.5–5.0)
Anion gap: 10 (ref 5–15)
BUN: 24 mg/dL — AB (ref 6–20)
CALCIUM: 9 mg/dL (ref 8.9–10.3)
CO2: 21 mmol/L — AB (ref 22–32)
Chloride: 100 mmol/L — ABNORMAL LOW (ref 101–111)
Creatinine, Ser: 0.68 mg/dL (ref 0.61–1.24)
GFR calc Af Amer: >60 mL/min (ref >60)
GFR calc non Af Amer: >60 mL/min (ref >60)
GLUCOSE: 107 mg/dL — AB (ref 65–99)
PHOSPHORUS: 4.8 mg/dL — AB (ref 2.5–4.6)
Potassium: 4 mmol/L (ref 3.5–5.1)
Sodium: 131 mmol/L — ABNORMAL LOW (ref 135–145)

## 2017-05-30 LAB — GLUCOSE, CAPILLARY
GLUCOSE-CAPILLARY: 88 mg/dL (ref 65–99)
GLUCOSE-CAPILLARY: 107 mg/dL — AB (ref 65–99)
Glucose-Capillary: 104 mg/dL — ABNORMAL HIGH (ref 65–99)
Glucose-Capillary: 106 mg/dL — ABNORMAL HIGH (ref 65–99)
Glucose-Capillary: 78 mg/dL (ref 65–99)
Glucose-Capillary: 87 mg/dL (ref 65–99)

## 2017-05-30 LAB — MAGNESIUM
Magnesium: 1.2 mg/dL — ABNORMAL LOW (ref 1.7–2.4)
Magnesium: 1.8 mg/dL (ref 1.7–2.4)

## 2017-05-30 LAB — PATHOLOGIST SMEAR REVIEW: PATH REVIEW: INCREASED

## 2017-05-30 MED ORDER — LINEZOLID 600 MG/300ML IV SOLN
600.0000 mg | Freq: Two times a day (BID) | INTRAVENOUS | Status: DC
  Administered 2017-05-30 – 2017-06-12 (×28): 600 mg via INTRAVENOUS
  Filled 2017-05-30 (×31): qty 300

## 2017-05-30 MED ORDER — MAGNESIUM SULFATE 50 % IJ SOLN
3.0000 g | Freq: Once | INTRAVENOUS | Status: AC
  Administered 2017-05-30: 3 g via INTRAVENOUS
  Filled 2017-05-30: qty 6

## 2017-05-30 MED ORDER — MAGNESIUM SULFATE IN D5W 1-5 GM/100ML-% IV SOLN
1.0000 g | Freq: Once | INTRAVENOUS | Status: AC
  Administered 2017-05-30: 1 g via INTRAVENOUS
  Filled 2017-05-30: qty 100

## 2017-05-30 MED ORDER — MAGNESIUM SULFATE 50 % IJ SOLN
6.0000 g | INTRAVENOUS | Status: AC
  Administered 2017-05-30: 6 g via INTRAVENOUS
  Filled 2017-05-30: qty 12

## 2017-05-30 NOTE — Progress Notes (Signed)
Pt awake, alert.  Not following commands Mumbling speech intermittently.  EVD in place. Raise to 25 today

## 2017-05-30 NOTE — Progress Notes (Addendum)
[facility] Attending Note  ADMISSION DATE:  05/20/2017  CHIEF COMPLAINT:  Seizures, altered MS.   Presenting HPI:  26 y.o. male with male with history ofHIV AIDS with history of noncompliance here with altered mental status. History is limited as patient was pulled out of his car unresponsive. Family did report that he thought the patient had a seizure. On arrival, patient is postictal and unable to provide history. Per review of records, patient was seen 3 days ago in the ER for headache. He was afebrile and otherwise very well-appearing at that time. He had a CT scan which was negative and sent home with supportive care.  Subjective:  No acute events overnight. Intraventricular drain raced again today.  Review of Systems:  Unable to obtain given altered mental status.   Temp:  [97.5 F (36.4 C)-98.7 F (37.1 C)] 98.7 F (37.1 C) (06/06 0800) Pulse Rate:  [96-112] 103 (06/06 0900) Resp:  [15-21] 16 (06/06 0900) BP: (98-125)/(52-80) 102/55 (06/06 0900) SpO2:  [100 %] 100 % (06/06 0900) Weight:  [139 lb 12.4 oz (63.4 kg)-141 lb 1.5 oz (64 kg)] 141 lb 1.5 oz (64 kg) (06/06 0437)  General:  Sleeping until awoken. No distress. No family at bedside.  Integument:  Warm & dry. No rash on exposed skin. Right parietal ventriculostomy drain in place. Extremities:  No cyanosis or clubbing.  HEENT:  Moist mucus membranes. No scleral injection or icterus. Pupils symmetric. Cardiovascular:  Regular rhythm. No edema. No appreciable JVD.  Pulmonary:  Clear bilaterally on auscultation. No accessory muscle use on room air. Abdomen: Soft. Normal bowel sounds. Nondistended.  Musculoskeletal:  Normal bulk and tone. No joint deformity or effusion appreciated.  Neurological: Patient spontaneously moving all 4 extremities. Not following commands. Intermittently attends to voice.  LINES/TUBES: OETT 5/27 - 6/3 OGT 5/27 - 6/3 NGT >>> Right Ventriculostomy 5/28 >>> LUE TL PICC 6/3  >>> Foley 6/4 >>> PIV  CBC Latest Ref Rng & Units 05/30/2017 05/29/2017 05/28/2017  WBC 4.0 - 10.5 K/uL 2.5(L) 3.7(L) 4.5  Hemoglobin 13.0 - 17.0 g/dL 8.2(L) 10.1(L) 10.9(L)  Hematocrit 39.0 - 52.0 % 26.3(L) 32.5(L) 33.9(L)  Platelets 150 - 400 K/uL 215 294 298    BMP Latest Ref Rng & Units 05/30/2017 05/29/2017 05/28/2017  Glucose 65 - 99 mg/dL 107(H) 114(H) 106(H)  BUN 6 - 20 mg/dL 24(H) 26(H) 36(H)  Creatinine 0.61 - 1.24 mg/dL 0.68 0.81 1.02  BUN/Creat Ratio 6 - 22 Ratio - - -  Sodium 135 - 145 mmol/L 131(L) 135 137  Potassium 3.5 - 5.1 mmol/L 4.0 4.1 4.2  Chloride 101 - 111 mmol/L 100(L) 107 112(H)  CO2 22 - 32 mmol/L 21(L) 21(L) 19(L)  Calcium 8.9 - 10.3 mg/dL 9.0 9.0 9.1    Hepatic Function Latest Ref Rng & Units 05/30/2017 05/29/2017 05/23/2017  Total Protein 6.5 - 8.1 g/dL - - 7.9  Albumin 3.5 - 5.0 g/dL 2.5(L) 2.4(L) 2.2(L)  AST 15 - 41 U/L - - 27  ALT 17 - 63 U/L - - 17  Alk Phosphatase 38 - 126 U/L - - 126  Total Bilirubin 0.3 - 1.2 mg/dL - - 0.8  Bilirubin, Direct 0.1 - 0.5 mg/dL - - 0.3    IMAGING/STUDIES: MRI BRAIN W/O 6/1: IMPRESSION: 1. Multifocal cortical diffusion restriction involving both cerebellar hemispheres and the bilateral temporal, occipital and frontal lobes, as well as the deep gray nuclei. 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 Postictal changes could have a  similar appearance, but the pattern is more typical of hypoxic ischemic injury. 2. Areas of subarachnoid or leptomeningeal contrast enhancement at the sites of diffusion restriction may be secondary to blood-brain barrier breakdown in the setting of a subacute ischemic event. In the context of HIV, however, an infectious meningitis should also be considered. 3. Small amount of blood adjacent to the right frontal approach extraventricular drain.  PORT CXR 6/3:  No active disease.  MICROBIOLOGY: MRSA PCR 5/27:  Negative  Blood Cultures x2 5/27:  1/2 Bottles Positive  Streptococcus pneumoniae  Blood Fungal Culture 5/27:  Negative  CSF Culture 5/27:  Cryptococcus neoformans  Tracheal Aspirate Culture 5/28:  MODERATE BORDETELLA BRONCHISEPTICA  Blood Cultures x2 5/29:  Negative  Gastrointestinal PCR 5/31:  Campylobacter / E coli (shiga-like toxin producing) / Giardia lamblia / Sapovirus  CSF Culture 6/5 >>> Yeast  ANTIBIOTICS: Ambisome 5/27 >>> Flucytosine 5/27 >>> Rocephin 5/27 >>> Bactrim 5/27 >>> Azithromycin qweek 5/31 >>> Doxycycline 6/2 >>>  SIGNIFICANT EVENTS: 05/27 - Admit 05/28 - Urgent right ventriculostomy placed for acute decompensation with fixed right pupil   ASSESSMENT/PLAN:  26 y.o.  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 Encephalopathy persists. ID, neurology, and neurosurgery following. Appreciate assistance.   1. Acute encephalopathy: No significant change. Continuing to avoid sedatives. 2. Seizure: Likely secondary to meningoencephalitis. Continuing Keppra. Neurology following. 3. Cryptococcal meningoencephalitis: Continuing treatment with AmBisome and flucytosine per ID recommendations area management of ventriculostomy as per neurosurgery. 4. Streptococcus pneumoniae bacteremia: Currently on day #11/14 of treatment with Rocephin. 5. Bordetella pneumonia: Currently on treatment day #5/7 with doxycycline. Could consider discontinuing droplet isolation tomorrow. 6. Acute hypoxic respiratory failure: Resolved. Continuing to monitor with continuous pulse oximetry. 7. Chronic hepatitis B viral infection: Holding on treatment given normal LFTs. 8. Diarrhea: Resolved. 9. Acute renal failure: Resolved. Continuing IV fluids with AmBisome. 10. Anemia: Hemoglobin stable. No evidence of active bleeding. Trending cell counts daily with CBC. 11. Hypomagnesemia: Magnesium sulfate IV. Repeat magnesium level with a.m. labs. 12. HIV/AIDS: Continuing Bactrim and azithromycin for prophylaxis.   Prophylaxis:  SCDs.  Diet:   NPO. Speech therapy following. Continuing tube feeding. Code Status:  Full Code per previous physician discussions. Disposition:  Remains in the ICU while ventriculostomy drain is being adjusted. Family Update:  No family at bedside during rounds today.  I have spent a total of 36 minutes of time today caring for the patient, reviewing the patient's electronic medical record, and with more than 50% of that time spent coordinating care with the patient as well as reviewing the continuing plan of care with the patient's nurse at bedside.  Sonia Baller Ashok Cordia, M.D. [facility] Pager:  [phone] After 3pm or if no response, call [phone] 10:00 AM 05/30/17

## 2017-05-30 NOTE — Progress Notes (Signed)
  Speech Language Pathology Treatment: Dysphagia  Patient Details Name: Ola SpurrLinston R Pursell MRN: [id number] DOB: 05/19/1991 Today's Date: 05/30/2017 Time: [id number] SLP Time Calculation (min) (ACUTE ONLY): 24 min  Assessment / Plan / Recommendation Clinical Impression  Pt is much more alert today and is engaging in conversation. He is oriented to person and knows the year; with multiple choice cues, he showed orientation to location and identified his place of work. He showed appropriate acceptance and mastication with one ice chip but did not open his mouth for more. Between head nods and verbalizations, he communicated his preference for water and then consumed several small sips with suspected delayed initiation followed by delayed coughing. Pt shows improving mentation but is not quite ready for POs. Discussed with grandmother, who was present for today's session. Recommend to remain NPO for today with SLP f/u on next date to assess for readiness for POs versus need for instrumental testing as his mentation continues to improve.   HPI HPI: Pt is a 26 year old man admitted s/p seizure activity having several days of recent headaches. 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 EVD was placed after acute decompensation on 5/28 with decorticate posturing and asymmetric pupils. 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 PMH includes: syphilis, substance abuse, soft palate ulceration, scabies, neuromuscular disorder, HIV/AIDS, depression, bipolar disorder, anxiety      SLP Plan  Continue with current plan of care       Recommendations  Diet recommendations: NPO Medication Administration: Via alternative means                Oral Care Recommendations: Oral care QID Follow up Recommendations:  (tba) SLP Visit Diagnosis: Dysphagia, unspecified (R13.10) Plan: Continue with current plan of care       GO                 Maxcine Hamaiewonsky, Tais Koestner 05/30/2017, 2:46 PM  Maxcine HamLaura Paiewonsky, M.A. CCC-SLP [phone]

## 2017-05-30 NOTE — Progress Notes (Signed)
eLink Physician-Brief Progress Note Patient Name: Ola SpurrLinston R Steeber DOB: 04/14/1991 MRN: [id number]   Date of Service  05/30/2017  HPI/Events of Note  Mg++ = 1.8 and Creatinine = 0.68.  eICU Interventions  Will replace Mg++.     Intervention Category Major Interventions: Electrolyte abnormality - evaluation and management  Ashlin Hidalgo Eugene 05/30/2017, 9:22 PM

## 2017-05-30 NOTE — Progress Notes (Signed)
Neurology Progress Note  Subjective: No significant 24 hour events reported. Overall, there does not appear to be any significant change in his neurologic status compared to yesterday. He will open his eyes and verbalizes at times, not clearly following commands. He did not attempt to communicate with me this morning and did not answer any questions on review of systems.   Medications reviewed and reconciled.   Pertinent meds: Amphotericin B 250 mg every 24 hours Azithromycin 1200 mg weekly Ceftriaxone 2 g every 24 hours Diphenhydramine 25 mg every 24 hours Doxycycline 100 mg every 12 hours Flucytosine 1500 mg every 6 hours Keppra 1000 g every 12 hours Bactrim 10 mL daily  Current Meds:   Current Facility-Administered Medications:  .  0.9 %  sodium chloride infusion, 250 mL, Intravenous, PRN, Wilber OliphantPanchal, Amar, MD, Last Rate: 10 mL/hr at 05/30/17 0700, 250 mL at 05/30/17 0700 .  acetaminophen (TYLENOL) tablet 650 mg, 650 mg, Oral, Q4H PRN, Wilber OliphantPanchal, Amar, MD .  acetaminophen (TYLENOL) tablet 650 mg, 650 mg, Oral, Q24H, Daiva EvesVan Dam, Lisette Grinderornelius N, MD, 650 mg at 05/29/17 1630 .  albuterol (PROVENTIL) (2.5 MG/3ML) 0.083% nebulizer solution 2.5 mg, 2.5 mg, Nebulization, Q2H PRN, Wilber OliphantPanchal, Amar, MD .  amphotericin B liposome (AMBISOME) 250 mg in dextrose 5 % 500 mL IVPB, 4 mg/kg, Intravenous, Q24H, Shaune PollackIsaacs, Cameron, MD, Stopped at 05/29/17 1959 .  [START ON 05/31/2017] azithromycin (ZITHROMAX) 200 MG/5ML suspension 1,200 mg, 1,200 mg, Per Tube, Weekly, Judyann MunsonSnider, Cynthia, MD .  cefTRIAXone (ROCEPHIN) 2 g in dextrose 5 % 50 mL IVPB, 2 g, Intravenous, Q24H, Judyann MunsonSnider, Cynthia, MD, Stopped at 05/30/17 [id number] .  chlorhexidine (PERIDEX) 0.12 % solution 15 mL, 15 mL, Mouth Rinse, BID, Roslynn AmbleNestor, Jennings E, MD, 15 mL at 05/30/17 0908 .  Chlorhexidine Gluconate Cloth 2 % PADS 6 each, 6 each, Topical, Daily, Leslye PeerByrum, Robert S, MD, 6 each at 05/29/17 1310 .  dextrose 5 % and 0.45 % NaCl with KCl 20 mEq/L infusion, , Intravenous,  Continuous, Panchal, Amar, MD, Last Rate: 50 mL/hr at 05/30/17 0700 .  diphenhydrAMINE (BENADRYL) capsule 25 mg, 25 mg, Oral, Q24H, Daiva EvesVan Dam, Lisette Grinderornelius N, MD, 25 mg at 05/29/17 1706 .  doxycycline (VIBRAMYCIN) 100 mg in dextrose 5 % 250 mL IVPB, 100 mg, Intravenous, Q12H, Judyann MunsonSnider, Cynthia, MD, Stopped at 05/30/17 0256 .  feeding supplement (VITAL AF 1.2 CAL) liquid 1,000 mL, 1,000 mL, Per Tube, Continuous, Roslynn AmbleNestor, Jennings E, MD, Last Rate: 75 mL/hr at 05/30/17 0700, 1,000 mL at 05/30/17 0700 .  flucytosine (ANCOBON) capsule 1,500 mg, 1,500 mg, Per Tube, Q6H, Sood, Vineet, MD, 1,500 mg at 05/30/17 0900 .  insulin aspart (novoLOG) injection 2-6 Units, 2-6 Units, Subcutaneous, Q4H, Wilber OliphantPanchal, Amar, MD, Stopped at 05/29/17 0411 .  levETIRAcetam (KEPPRA) 1,000 mg in sodium chloride 0.9 % 100 mL IVPB, 1,000 mg, Intravenous, Q12H, Rejeana BrockKirkpatrick, McNeill P, MD, Stopped at 05/30/17 [phone]  magnesium sulfate 6 g in dextrose 5 % 100 mL IVPB, 6 g, Intravenous, STAT, Rumbarger, Faye RamsayRachel L, RPH, Last Rate: 56 mL/hr at 05/30/17 0845, 6 g at 05/30/17 0845 .  MEDLINE mouth rinse, 15 mL, Mouth Rinse, BID, Leslye PeerByrum, Robert S, MD, 15 mL at 05/29/17 2120 .  MEDLINE mouth rinse, 15 mL, Mouth Rinse, q12n4p, Roslynn AmbleNestor, Jennings E, MD, 15 mL at 05/29/17 1713 .  metoprolol tartrate (LOPRESSOR) injection 2.5-5 mg, 2.5-5 mg, Intravenous, Q3H PRN, Kalman Shanamaswamy, Murali, MD, 5 mg at 05/30/17 0146 .  metoprolol tartrate (LOPRESSOR) injection 5 mg, 5 mg, Intravenous, Q8H, Deterding, Dorise HissElizabeth C,  MD, 5 mg at 05/30/17 0517 .  ondansetron (ZOFRAN) injection 4 mg, 4 mg, Intravenous, Q6H PRN, Panchal, Amar, MD .  potassium chloride 20 MEQ/15ML (10%) solution 40 mEq, 40 mEq, Oral, Daily, Rumbarger, Faye Ramsay, RPH, 40 mEq at 05/30/17 0908 .  sodium chloride 0.9 % bolus 1,000 mL, 1,000 mL, Intravenous, Q24H, Dixon, Stephanie N, NP, 1,000 mL at 05/29/17 2007 .  sodium chloride 0.9 % bolus 1,000 mL, 1,000 mL, Intravenous, Q24H, Dixon, Stephanie N, NP, 1,000 mL  at 05/29/17 1709 .  sodium chloride flush (NS) 0.9 % injection 10-40 mL, 10-40 mL, Intracatheter, PRN, Leslye Peer, MD .  sulfamethoxazole-trimethoprim (BACTRIM,SEPTRA) 200-40 MG/5ML suspension 10 mL, 10 mL, Per Tube, Daily, Coralyn Helling, MD, 10 mL at 05/30/17 0920  Objective:  Temp:  [97.5 F (36.4 C)-98.7 F (37.1 C)] 98.7 F (37.1 C) (06/06 0800) Pulse Rate:  [96-112] 103 (06/06 0900) Resp:  [15-21] 16 (06/06 0900) BP: (98-125)/(52-80) 102/55 (06/06 0900) SpO2:  [100 %] 100 % (06/06 0900) Weight:  [63.4 kg (139 lb 12.4 oz)-64 kg (141 lb 1.5 oz)] 64 kg (141 lb 1.5 oz) (06/06 0437)  General: WD AA man lying in bed. He initially appeared to be asleep. As her spine the examination, he opened his eyes. He briefly fixed on me and tracked me around the bed but then closed his eyes again. He did not attempt to participate with examination. He made no attempts at communication today. He did not follow any commands.  HEENT: NGT in place. Sclerae are anicteric. There is no conjunctival injection.  CV: Regular, tachy, no obvious murmur. Carotid pulses are 2+ and symmetric with no bruits. Distal pulses 2+ and symmetric.  Lungs: CTAB on anterior auscultation.  Extremities: No C/C/E. Neuro: MS: As noted above.  CN: Pupils are equal and sluggishly reactive from 4-->2 mm bilaterally. He inconsistently blinks to threat. Eyes are conjugate, no forced deviation and no nystagmus. His face appears to be grossly symmetric. The remainder of his cranial nerve exam is limited by inability to participate with the exam.  Motor: Thin bulk diffusely. He has normal tone. He does not participate with confrontational strength testing. He has some spontaneous movement of the arms and legs with at least 4-/5 strength throughout. No obvious focal asymmetry is appreciated. No tremor or other abnormal movements are observed.  Sensation: He grimaces and withdraws from nailbed pressure x4.  DTRs: 3+, symmetric. Toes are  mute bilaterally.  Coordination/gait:  Unable to assess as he does not participate with the exam.    Labs: Lab Results  Component Value Date   WBC 2.5 (L) 05/30/2017   HGB 8.2 (L) 05/30/2017   HCT 26.3 (L) 05/30/2017   PLT 215 05/30/2017   GLUCOSE 107 (H) 05/30/2017   CHOL 125 09/28/2016   TRIG 150 (H) 05/26/2017   HDL 31 (L) 09/28/2016   LDLCALC 70 09/28/2016   ALT 17 05/23/2017   AST 27 05/23/2017   NA 131 (L) 05/30/2017   K 4.0 05/30/2017   CL 100 (L) 05/30/2017   CREATININE 0.68 05/30/2017   BUN 24 (H) 05/30/2017   CO2 21 (L) 05/30/2017   INR 1.20 05/20/2017   CBC Latest Ref Rng & Units 05/30/2017 05/29/2017 05/28/2017  WBC 4.0 - 10.5 K/uL 2.5(L) 3.7(L) 4.5  Hemoglobin 13.0 - 17.0 g/dL 8.2(L) 10.1(L) 10.9(L)  Hematocrit 39.0 - 52.0 % 26.3(L) 32.5(L) 33.9(L)  Platelets 150 - 400 K/uL 215 294 298    No results found for: HGBA1C Lab  Results  Component Value Date   ALT 17 05/23/2017   AST 27 05/23/2017   ALKPHOS 126 05/23/2017   BILITOT 0.8 05/23/2017   Mg 1.2 CD4 count 2 HIV viral load 83,400  CSF culture from 05/29/17 pending with stain showing yeast  from 05/20/17: CSF WBCs 14, 95% lymphocytes CSF RBCs 3 CSF protein 39 CSF glucose 62 CSF Gram stain showed fungal elements CSF culture positive for cryptococcus neoformans  CSF cryptococcal antigen positive with titer greater than 2560 CSF VDRL nonreactive CSF HSV PCR negative   Radiology:  There is no new neuroimaging.   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 This has been complicated by increased ICP and early herniation which resolved with mannitol and CSF diversion.  Continue amphotericin and flucytosine, appreciate ID assistance. CSF Gram stain yesterday shows yeast with culture pending. Ventricular drain is in place, appreciate neurosurgery assistance.   2. Acute ischemic infarction: He has numerous scattered areas of ischemia on MRI. These are  consistent with vascular involvement by cryptococcal meningitis. Continue antifungals. There is no role for antiplatelet agents or statins in this context.   3. Acute encephalopathy: This is likely multifactorial with contributions from cryptococcal meningitis, ischemic infarctions, respiratory failure, medication effect, metabolic derangements. He has shown some slight improvement but no change in today's exam versus yesterday. Continue with supportive care. Continue to optimize metabolic status as you are. Continue to treat underlying infections as needed. Continue to wean sedation as tolerated, minimizing the use of opiates, benzos, and anything with strong anticholinergic properties.  4. Seizures: He had seizure activity on presentation. No reported prior history of seizure. Continue Keppra. This is likely provoked by his cryptococcal meningitis. Seizure precautions.  5. HIV/AIDS: Current CD4 count is 2. Appreciate ID assistance. Antiretroviral therapy is on hold given acute cryptococcal infection.  No family was present at the time of my visit.   Rhona Leavens, MD [facility]

## 2017-05-30 NOTE — Progress Notes (Signed)
Pharmacy Consult Note:  Electrolyte Replacement  26 yom currently on treatment for cryptococcal meningitis and strep pneumo bacteremia. Pharmacy consulted by ID to manage electrolyte replacement. SCr back down to 0.68,  K WNL at 4 on daily KCl + K+ in IVF Mag down to 1.2 despite aggressive repletion  Plan: Continue KCl 40meq daily + KCl in fluids Suppplement 6gm Mg IV x 1 now Recheck Mg tonight at 1800 to determine if further supplementation is needed Monitor K and Mag closely and order additional replacement prn  Lysle Pearlachel Latrease Kunde, PharmD, BCPS 05/30/2017 7:48 AM

## 2017-05-30 NOTE — Progress Notes (Signed)
Pharmacy Consult Note:  Electrolyte Replacement  26 yom currently on treatment for cryptococcal meningitis and strep pneumo bacteremia. Pharmacy consulted by ID to manage electrolyte replacement. SCr back down to 0.68.  K WNL at 4 on daily KCL and KCL in IVF Mag improved to 1.8 post 6gm IV x 1 this AM   Plan: - Give an additional mag sulfate 3gm IV x 1 - F/U AM labs   Halea Lieb D. Laney Potashang, PharmD, BCPS Pager:  [phone] 05/30/2017, 6:08 PM

## 2017-05-30 NOTE — Progress Notes (Addendum)
[facility] [place] [facility] Pulmonary & Critical Care Attending Note  ADMISSION DATE:  05/20/2017  CHIEF COMPLAINT:  Seizures, altered MS.   Presenting HPI:  26 y.o. male with male with history ofHIV AIDS with history of noncompliance here with altered mental status. History is limited as patient was pulled out of his car unresponsive. Family did report that he thought the patient had a seizure. On arrival, patient is postictal and unable to provide history. Per review of records, patient was seen 3 days ago in the ER for headache. He was afebrile and otherwise very well-appearing at that time. He had a CT scan which was negative and sent home with supportive care.  Subjective:  Remains encephalopathic   Objective Temp:  [97.6 F (36.4 C)-98.3 F (36.8 C)] 97.8 F (36.6 C) (06/07 0800) Pulse Rate:  [90-116] 96 (06/07 0700) Resp:  [13-23] 15 (06/07 0700) BP: (93-128)/(43-84) 116/60 (06/07 0700) SpO2:  [100 %] 100 % (06/07 0700) Weight:  [133 lb 9.6 oz (60.6 kg)] 133 lb 9.6 oz (60.6 kg) (06/07 0500)  Intake/Output Summary (Last 24 hours) at 05/31/17 0848 Last data filed at 05/31/17 0700  Gross per 24 hour  Intake             5805 ml  Output             4213 ml  Net             1592 ml    General:  Awake, no distress.  Integument: warm and dry Extremities: no cyanosis or clubbing HEENT: NCAT.  Cardiovascular:  RRR no MRG Pulmonary:  Clear w/out accessory use  Abdomen:soft, not tender Neurological: awake, intermittently f/c. Moves all ext. Ventric w/ straw colored CSF CBC Recent Labs     05/29/17  0436  05/30/17  0445  05/31/17  0255  WBC  3.7*  2.5*  2.5*  HGB  10.1*  8.2*  10.1*  HCT  32.5*  26.3*  30.6*  PLT  294  215  301    Coag's No results for input(s): APTT, INR in the last 72 hours.  BMET Recent Labs     05/29/17  0436  05/30/17  0600  05/31/17  0508  NA  135  131*  127*  K  4.1  4.0  5.8*  CL  107  100*  103  CO2  21*  21*  21*  BUN  26*  24*  17  CREATININE  0.81   0.68  0.61  GLUCOSE  114*  107*  480*    Electrolytes Recent Labs     05/29/17  0436  05/30/17  0600  05/30/17  1713  05/31/17  0255  05/31/17  0508  CALCIUM  9.0  9.0   --    --   7.7*  MG  1.3*  1.2*  1.8  2.2   --   PHOS  4.7*  4.8*   --    --   3.7    Sepsis Markers No results for input(s): PROCALCITON, O2SATVEN in the last 72 hours.  Invalid input(s): LACTICACIDVEN  ABG No results for input(s): PHART, PCO2ART, PO2ART in the last 72 hours.  Liver Enzymes Recent Labs     05/29/17  0436  05/30/17  0600  05/31/17  0508  ALBUMIN  2.4*  2.5*  2.2*    Cardiac Enzymes No results for input(s): TROPONINI, PROBNP in the last 72 hours.  Glucose Recent Labs     05/30/17  1205  05/30/17  1603  05/30/17  2016  05/30/17  2343  05/31/17  0352  05/31/17  0808  GLUCAP  104*  107*  106*  78  89  85    Imaging No results found.  IMAGING/STUDIES: MRI BRAIN W/O 6/1: IMPRESSION: 1. Multifocal cortical diffusion restriction involving both cerebellar hemispheres and the bilateral temporal, occipital and frontal lobes, as well as the deep gray nuclei. 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 Postictal changes could have a similar appearance, but the pattern is more typical of hypoxic ischemic injury. 2. Areas of subarachnoid or leptomeningeal contrast enhancement at the sites of diffusion restriction may be secondary to blood-brain barrier breakdown in the setting of a subacute ischemic event. In the context of HIV, however, an infectious meningitis should also be considered. 3. Small amount of blood adjacent to the right frontal approach extraventricular drain. LINES/TUBES: OETT 5/27 - 6/3 OGT 5/27 - 6/3 NGT >>> Right Ventriculostomy 5/28 >>> LUE TL PICC 6/3 >>> Foley 6/4 >>> PIV  PORT CXR 6/3:  No active disease.  MICROBIOLOGY: MRSA PCR 5/27:  Negative  Blood Cultures x2 5/27:  1/2 Bottles Positive Streptococcus pneumoniae  Blood Fungal  Culture 5/27:  Negative  CSF Culture 5/27:  Cryptococcus neoformans  Tracheal Aspirate Culture 5/28:  MODERATE BORDETELLA BRONCHISEPTICA  Blood Cultures x2 5/29:  Negative  Gastrointestinal PCR 5/31:  Campylobacter / E coli (shiga-like toxin producing) / Giardia lamblia / Sapovirus  CSF Culture 6/5 >>> Yeast & few GPC>>>  ANTIBIOTICS: Ambisome 5/27 >>> Flucytosine 5/27 >>> Rocephin 5/27 >>> Bactrim 5/27 >>> Azithromycin qweek 5/31 >>> Doxycycline 6/2 >>> linezolid 6/6>>>  SIGNIFICANT EVENTS: 05/27 - Admit 05/28 - Urgent right ventriculostomy placed for acute decompensation with fixed right pupil   ASSESSMENT/PLAN:   26 y.o.  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 Encephalopathy persists. ID, neurology, and neurosurgery following. Appreciate assistance.   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 -neurology following Plan Keppra   Streptococcus pneumoniae bacteremia:  Plan Day 12/14 Rocephin   Bordetella pneumonia:  Plan Day 5/7 doxy Defer timing of stopping isolation precautions to Dr Tyson Alias  Fluid and Electrolyte imbalance: Hyperkalemia and progressive hyponatremia ? Lab error?? Plan Repeat chemistry this am-->glucose had huge discrepancy between cap blood draws and serum (80s via cbg and 450 serum) there was no D50 given.  Cont I&O Replace and correct imbalance as indicated  Hyperglycemia ? Lab error from am chemistry??? Plan Repeat serial CBGs Repeat serum glucose   Chronic hepatitis B viral infection: Holding on  treatment  Plan Trend LFTs No rx at this time  HIV/AIDS: Plan Cont bactrim and azith for prophylaxis Holding ARVs while being treated for CMV  Anemia  Plan Trend cbc Transfuse as indicated   Resolved issues: Diarrhea: Resolved. Acute renal failure: Resolved. Continuing IV fluids with AmBisome.  Prophylaxis:  SCDs.  Diet:  NPO. Speech therapy following. Continuing tube feeding. Code Status:  Full Code per previous physician discussions. Disposition:  Remains in the ICU while ventriculostomy drain is being adjusted. Family Update:  No family at bedside during rounds today.  My ccm time 34 minutes  Simonne Martinet ACNP-BC [facility] Pulmonary/Critical Care Pager # [phone] OR # [phone] if no answer   8:48 AM 05/31/17   STAFF NOTE: I, Rory Percy, MD FACP have personally reviewed patient's available data, including medical history, events of note, physical examination and test results as part of my evaluation. I have discussed with resident/NP and other care providers such as pharmacist, RN and RRT. In addition, I personally evaluated patient and elicited key findings of:  Awakens, confused, talking, no distress, calm, lungs slight coarse, min edema, was pos 1.6 liters, it appears that the chem panel at 508 am is error, repeat is back and reviewed and similar to 6/6 chem panel, drain has been clamped after being raised, will follow neurostatus after clamp today, his agitation is controlled on own, if worsen would use precedex likely to not suppress drive and be short acting, will repeat chem in am , has foley for retention required, folow cbc leukopenia relative in am , antifungals and culture dats to remaina dn follow, almost complete strep coarse and to dc reps isolation today The patient is critically ill with multiple organ systems failure and requires high complexity decision making for assessment and support, frequent evaluation and titration of therapies, application of  advanced monitoring technologies and extensive interpretation of multiple databases.   Critical Care Time devoted to patient care services described in this note is 30 Minutes. This time reflects time of care of this signee: Rory Percy, MD FACP. This critical care time does not reflect procedure time, or teaching time or supervisory time of PA/NP/Med student/Med Resident etc but could involve care discussion time. Rest per NP/medical resident whose note is outlined above and that I agree with   [name]. Tyson Alias, MD, FACP Pgr: [phone] [facility] Critical [facility] 05/31/2017 10:51 AM

## 2017-05-30 NOTE — Progress Notes (Signed)
[facility]    Date of Admission:  05/20/2017   Total days of antibiotics 11 Day 11L-ampho/flucytosine Day 11ceftriaxone Day 11bactrim proph Day 5 doxycycline        Day 1 Linezolid   ID: Justin Ball is a 26 y.o. male with CM, strep pneumo bacteremia, CoNS in CSF with Ventricular drain in place and bordetella bronchiseptica PNA.   Principal Problem:   Seizure (HCC) Active Problems:   HIV (human immunodeficiency virus infection) (HCC)   Rash and nonspecific skin eruption   Insomnia   Bipolar disorder (HCC)   Intersphincteric abscess   AIDS (HCC)   Headache   Meningoencephalitis   Respiratory failure (HCC)   Encephalopathy acute    Subjective: Lying in bed. Opens eyes only. No attempt to communicate.   Medications:  . acetaminophen  650 mg Oral Q24H  . [START ON 05/31/2017] azithromycin  1,200 mg Per Tube Weekly  . chlorhexidine  15 mL Mouth Rinse BID  . Chlorhexidine Gluconate Cloth  6 each Topical Daily  . diphenhydrAMINE  25 mg Oral Q24H  . flucytosine  1,500 mg Per Tube Q6H  . insulin aspart  2-6 Units Subcutaneous Q4H  . mouth rinse  15 mL Mouth Rinse q12n4p  . metoprolol tartrate  5 mg Intravenous Q8H  . potassium chloride  40 mEq Oral Daily  . sulfamethoxazole-trimethoprim  10 mL Per Tube Daily    Objective: Vital signs in last 24 hours: Temp:  [97.7 F (36.5 C)-98.7 F (37.1 C)] 98.2 F (36.8 C) (06/06 1200) Pulse Rate:  [96-112] 103 (06/06 0900) Resp:  [15-21] 19 (06/06 1400) BP: (98-128)/(52-80) 105/66 (06/06 1400) SpO2:  [100 %] 100 % (06/06 1300) Weight:  [139 lb 12.4 oz (63.4 kg)-141 lb  1.5 oz (64 kg)] 141 lb 1.5 oz (64 kg) (06/06 0437)  General appearance: Lying in bed asleep, no distress. Safety mittens in place.  Eyes: conjunctivae/corneas clear. PERRL. Does not make eye contact today.  Neck: no adenopathy, no JVD and supple, symmetrical, trachea midline Resp: clear to auscultation bilaterally and diminished bibasilarly Cardio: S1, S2 normal and tachycardic. No murmur GI: soft, non-tender; bowel sounds normal; no masses, no organomegaly Extremities: extremities normal, atraumatic, no cyanosis or edema Pulses: 2+ and symmetric Skin: Skin color, texture, turgor normal.  Lymph nodes: Cervical, supraclavicular, and axillary nodes normal. Neurologic: Continues to open eyes spontaneously, does not track, does not attempt to communicate, does not follow commands.   Lab Results  Recent Labs  05/29/17 0436 05/30/17 0445 05/30/17 0600  WBC 3.7* 2.5*  --   HGB 10.1* 8.2*  --   HCT 32.5* 26.3*  --   NA 135  --  131*  K 4.1  --  4.0  CL 107  --  100*  CO2 21*  --  21*  BUN 26*  --  24*  CREATININE 0.81  --  0.68   Liver Panel  Recent Labs  05/29/17 0436 05/30/17 0600  ALBUMIN 2.4* 2.5*   Sedimentation Rate No results for input(s): ESRSEDRATE in the last 72 hours. C-Reactive Protein No results for input(s): CRP in the last 72 hours.  Microbiology:  Studies/Results: No results found.   Assessment/Plan: Cryptococcal meningitis c/b scattered infarctions =  - Continue with L-ampho and flucytosine (day 11/14) - Pharmacy/PCCM repleting electrolyte losses - CSF 6/5 >> yeast on GS - will continue induction treatment until culture results. Will need to follow with another sample prior to removal of Ventricular  drain/Friday please.  - Ventricular drain per neurosurgery - raised to 25 today, CSF clear.  Encephalopathy =  - Neuro status still uncertain at this time. Appreciate neurology and neurosurgery's care.   Strep pneumoniae bacteremia =  - Plan to  treat for 14d with ceftriaxone 2gm IV daily (on day 11/14)  Bordetella pneumonia =  - Continue Doxy treatment for 5-7d (currently on day 4).  - Droplet can d/c once 5 days of treatment in place - He has been extubated as of 6/2 and protecting his airway  HIV =  - hold on art while on CM treatment, for at least 5 wk - OI proph >> continue with daily bactrim and weekly azithromycin  Chronic hep b =  - High viral load but AST/ALT are normal. No need to treat at this time.   With yeast growing on repeat CSF would like to send down new sample Friday (or prior to drain removal as neurosurgery is weaning this now). GPC growing in CSF sample from 6/5 with yeast on GS - likely CoNS; with ventricular drain in place and no improvement in neurologic status will treat with linezolid (no vancomycin for now to limit nephrotoxic agents with L-ampho on board).    Rexene AlbertsStephanie Dixon, MSN, NP-C [facility] Cell: [phone] Pager: [phone]  05/30/2017, 4:15 PM

## 2017-05-31 LAB — MAGNESIUM: Magnesium: 2.2 mg/dL (ref 1.7–2.4)

## 2017-05-31 LAB — CBC WITH DIFFERENTIAL/PLATELET
BASOS ABS: 0 10*3/uL (ref 0.0–0.1)
Basophils Relative: 0 %
EOS ABS: 0.2 10*3/uL (ref 0.0–0.7)
Eosinophils Relative: 8 %
HEMATOCRIT: 30.6 % — AB (ref 39.0–52.0)
HEMOGLOBIN: 10.1 g/dL — AB (ref 13.0–17.0)
Lymphocytes Relative: 13 %
Lymphs Abs: 0.3 10*3/uL — ABNORMAL LOW (ref 0.7–4.0)
MCH: 29.6 pg (ref 26.0–34.0)
MCHC: 33 g/dL (ref 30.0–36.0)
MCV: 89.7 fL (ref 78.0–100.0)
Monocytes Absolute: 0.2 10*3/uL (ref 0.1–1.0)
Monocytes Relative: 6 %
NEUTROS ABS: 1.8 10*3/uL (ref 1.7–7.7)
NEUTROS PCT: 73 %
Platelets: 301 10*3/uL (ref 150–400)
RBC: 3.41 MIL/uL — AB (ref 4.22–5.81)
RDW: 13.6 % (ref 11.5–15.5)
WBC: 2.5 10*3/uL — AB (ref 4.0–10.5)

## 2017-05-31 LAB — COMPREHENSIVE METABOLIC PANEL
ALBUMIN: 2.4 g/dL — AB (ref 3.5–5.0)
ALT: 95 U/L — ABNORMAL HIGH (ref 17–63)
ANION GAP: 10 (ref 5–15)
AST: 93 U/L — ABNORMAL HIGH (ref 15–41)
Alkaline Phosphatase: 223 U/L — ABNORMAL HIGH (ref 38–126)
BILIRUBIN TOTAL: 0.7 mg/dL (ref 0.3–1.2)
BUN: 18 mg/dL (ref 6–20)
CHLORIDE: 99 mmol/L — AB (ref 101–111)
CO2: 23 mmol/L (ref 22–32)
Calcium: 8.5 mg/dL — ABNORMAL LOW (ref 8.9–10.3)
Creatinine, Ser: 0.63 mg/dL (ref 0.61–1.24)
GFR calc Af Amer: >60 mL/min (ref >60)
GFR calc non Af Amer: >60 mL/min (ref >60)
GLUCOSE: 169 mg/dL — AB (ref 65–99)
POTASSIUM: 4 mmol/L (ref 3.5–5.1)
Sodium: 132 mmol/L — ABNORMAL LOW (ref 135–145)
Total Protein: 8.5 g/dL — ABNORMAL HIGH (ref 6.5–8.1)

## 2017-05-31 LAB — RENAL FUNCTION PANEL
ALBUMIN: 2.2 g/dL — AB (ref 3.5–5.0)
ANION GAP: 3 — AB (ref 5–15)
BUN: 17 mg/dL (ref 6–20)
CALCIUM: 7.7 mg/dL — AB (ref 8.9–10.3)
CO2: 21 mmol/L — ABNORMAL LOW (ref 22–32)
Chloride: 103 mmol/L (ref 101–111)
Creatinine, Ser: 0.61 mg/dL (ref 0.61–1.24)
Glucose, Bld: 480 mg/dL — ABNORMAL HIGH (ref 65–99)
PHOSPHORUS: 3.7 mg/dL (ref 2.5–4.6)
POTASSIUM: 5.8 mmol/L — AB (ref 3.5–5.1)
SODIUM: 127 mmol/L — AB (ref 135–145)

## 2017-05-31 LAB — GLUCOSE, CAPILLARY
GLUCOSE-CAPILLARY: 89 mg/dL (ref 65–99)
GLUCOSE-CAPILLARY: 85 mg/dL (ref 65–99)
GLUCOSE-CAPILLARY: 101 mg/dL — AB (ref 65–99)
Glucose-Capillary: 101 mg/dL — ABNORMAL HIGH (ref 65–99)
Glucose-Capillary: 116 mg/dL — ABNORMAL HIGH (ref 65–99)
Glucose-Capillary: 159 mg/dL — ABNORMAL HIGH (ref 65–99)

## 2017-05-31 MED ORDER — DEXMEDETOMIDINE HCL IN NACL 200 MCG/50ML IV SOLN
0.4000 ug/kg/h | INTRAVENOUS | Status: DC
  Administered 2017-05-31 – 2017-06-01 (×2): 0.4 ug/kg/h via INTRAVENOUS
  Filled 2017-05-31 (×3): qty 50

## 2017-05-31 MED ORDER — POTASSIUM CHLORIDE 20 MEQ/15ML (10%) PO SOLN
40.0000 meq | Freq: Every day | ORAL | Status: DC
  Administered 2017-05-31 – 2017-06-16 (×17): 40 meq via ORAL
  Filled 2017-05-31 (×17): qty 30

## 2017-05-31 MED ORDER — DEXTROSE-NACL 5-0.45 % IV SOLN
INTRAVENOUS | Status: DC
  Administered 2017-05-31 – 2017-06-15 (×10): via INTRAVENOUS

## 2017-05-31 NOTE — Progress Notes (Signed)
  Speech Language Pathology Treatment: Dysphagia  Patient Details Name: Justin Ball R Platner MRN: [id number] DOB: 08/04/1991 Today's Date: 05/31/2017 Time: [id number] SLP Time Calculation (min) (ACUTE ONLY): 18 min  Assessment / Plan / Recommendation Clinical Impression  [name] mentation continues to improve. Today he has increased automaticity with oral acceptance and transit with purees and liquids, with no overt s/s of aspiration even with 3 ounce water test. His mastication and posterior propulsion with soft solids is still delayed and incomplete, needing a liquid wash to clear oral residuals. Given his current mentation, recommend Dys 2 diet and thin liquids with full supervision. Will continue to follow for tolerance and advancement as able.   HPI HPI: Pt is a 26 year old man admitted s/p seizure activity having several days of recent headaches. 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 EVD was placed after acute decompensation on 5/28 with decorticate posturing and asymmetric pupils. 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 PMH includes: syphilis, substance abuse, soft palate ulceration, scabies, neuromuscular disorder, HIV/AIDS, depression, bipolar disorder, anxiety      SLP Plan  Continue with current plan of care       Recommendations  Diet recommendations: Dysphagia 2 (fine chop);Thin liquid Liquids provided via: Cup;Straw Medication Administration: Whole meds with puree Supervision: Staff to assist with self feeding;Full supervision/cueing for compensatory strategies Compensations: Minimize environmental distractions;Slow rate;Small sips/bites Postural Changes and/or Swallow Maneuvers: Seated upright 90 degrees                Oral Care Recommendations: Oral care BID Follow up Recommendations:  (tba) SLP Visit Diagnosis: Dysphagia, unspecified (R13.10) Plan: Continue with current plan of care        GO                Maxcine Hamaiewonsky, Talaysia Pinheiro 05/31/2017, 1:31 PM  Maxcine HamLaura Paiewonsky, M.A. CCC-SLP [phone]

## 2017-05-31 NOTE — Progress Notes (Addendum)
Pharmacy Consult Note:  Electrolyte Replacement  26 yom currently on treatment for cryptococcal meningitis and strep pneumo bacteremia. Pharmacy consulted by ID to manage electrolyte replacement. SCr back down to 0.61.  K now elevated at 5.8 at 4 on daily KCL and KCL in IVF  Mag now WNL at 2.2 after 10gm Mg yesterday   Plan: Hold daily potassium and potassium in fluids for now - rechecking CMET, will follow-up No Mg needed today  Lysle Pearlachel Lenardo Westwood, PharmD, BCPS 05/31/2017 7:18 AM  Addendum: Repeat CMET returned and is more consistent with previous results. K is 4. Will restart potassium supplementation.   Lysle Pearlachel Jocilyn Trego, PharmD, BCPS 05/31/2017 8:58 AM

## 2017-05-31 NOTE — Progress Notes (Signed)
No issues overnight. Pt neurologically stable: oriented to person, place. Follows simple commands. EVD in place, functioning well.   - Will clamp EVD today. If stable, likely d/c tomorrow

## 2017-05-31 NOTE — Progress Notes (Signed)
[facility]    Date of Admission:  05/20/2017   Total days of antibiotics 12 Day 12L-ampho/flucytosine Day 12ceftriaxone Day 12bactrim proph Day 6doxycycline                                                                                     Day 2 Linezolid   ID: Justin Ball is a 26 y.o. male with CM, strep pneumo bacteremia, CoNS in CSF with Ventricular drain in place and bordetella bronchiseptica PNA.   Principal Problem:   Seizure (HCC) Active Problems:   HIV (human immunodeficiency virus infection) (HCC)   Rash and nonspecific skin eruption   Insomnia   Bipolar disorder (HCC)   Intersphincteric abscess   AIDS (HCC)   Headache   Meningoencephalitis   Respiratory failure (HCC)   Encephalopathy acute    Subjective: Non verbal to me during visit. Reported from his nurse that he has garbled speech but does make attempts to communicate. Moving all extremities.   Medications:  . acetaminophen  650 mg Oral Q24H  . azithromycin  1,200 mg Per Tube Weekly  . chlorhexidine  15 mL Mouth Rinse BID  . Chlorhexidine Gluconate Cloth  6 each Topical Daily  . diphenhydrAMINE  25 mg Oral Q24H  . flucytosine  1,500 mg Per Tube Q6H  . insulin aspart  2-6 Units Subcutaneous Q4H  . mouth rinse  15 mL Mouth Rinse q12n4p  . metoprolol tartrate  5 mg Intravenous Q8H  . potassium chloride  40 mEq Oral Daily  . sulfamethoxazole-trimethoprim  10 mL Per Tube Daily    Objective: Vital signs in last 24 hours: Temp:  [97.6 F (36.4 C)-98.3 F (36.8 C)] 97.8 F (36.6 C) (06/07 0800) Pulse Rate:  [90-122] 122 (06/07 0800) Resp:   [13-23] 19 (06/07 0800) BP: (93-143)/(43-84) 143/83 (06/07 0800) SpO2:  [100 %] 100 % (06/07 0800) Weight:  [133 lb 9.6 oz (60.6 kg)] 133 lb 9.6 oz (60.6 kg) (06/07 0500)  General appearance: Lying in bed in no distress. Safety mittens in place. Kicks legs off bed all the time.  Eyes: conjunctivae/corneas clear. PERRL. Making eye contact.  Neck: no adenopathy, no JVD and supple, symmetrical, trachea midline Resp: clear to auscultation bilaterally and diminished bibasilarly Cardio: S1, S2 normal and tachycardic. No murmur GI: soft, non-tender; bowel sounds normal; no masses, no organomegaly Extremities: extremities normal, atraumatic, no cyanosis or edema Pulses: 2+ and symmetric Skin: Skin color, texture, turgor normal.  Lymph nodes: Cervical, supraclavicular, and axillary nodes normal. Neurologic: Continues to open eyes spontaneously. Moving all extremities.   Lab Results  Recent Labs  05/30/17 0445  05/31/17 0255 05/31/17 0508 05/31/17 0801  WBC 2.5*  --  2.5*  --   --   HGB 8.2*  --  10.1*  --   --   HCT 26.3*  --  30.6*  --   --   NA  --   < >  --  127* 132*  K  --   < >  --  5.8* 4.0  CL  --   < >  --  103 99*  CO2  --   < >  --  21* 23  BUN  --   < >  --  17 18  CREATININE  --   < >  --  0.61 0.63  < > = values in this interval not displayed. Liver Panel  Recent Labs  05/31/17 0508 05/31/17 0801  PROT  --  8.5*  ALBUMIN 2.2* 2.4*  AST  --  93*  ALT  --  95*  ALKPHOS  --  223*  BILITOT  --  0.7   Microbiology: - CSF 6/5 > Staph Epi, Yeast   Studies/Results: No results found.   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 Appreciate neurology and neurosurgery's care.  - Does have some neurologic improvement today on exam and nurse's report.   Strep pneumoniae bacteremia=  - Plan to treat for 14d with ceftriaxone 2gm IV daily (on day 12/14)  Bordetella pneumonia=  - Continue Doxy treatment for 7d (currently on day 6) - Droplet precautions can be discontinued today   HIV =  - hold on art while on CM treatment, for at least 5 wk - OI proph >>continue with daily bactrim and weekly azithromycin  Chronic hep b=  - High viral load but AST/ALTare normal. No need to treat at this time.  Would appreciate another CSF sample obtained tomorrow please prior to removal of drain to trend. Orders entered.   Rexene Alberts, MSN, NP-C [facility] Cell: [phone] Pager: [phone]  05/31/2017, 10:30 AM

## 2017-05-31 NOTE — Progress Notes (Signed)
Discussed with Dr. Luciana Axeomer and Pharmacy team - will stop Ceftriaxone as he is on Linezolid to complete treatment for Strep Pneumo bacteremia to consolidate therapy (received 12d Ceftriaxone).   Appreciate pharmacy's assistance with stewardship.   Rexene AlbertsStephanie Chudney Scheffler, MSN, NP-C [facility] Cell: [phone] Pager: [phone] 05/31/2017 3:47 PM

## 2017-05-31 NOTE — Progress Notes (Signed)
Neurology Progress Note  Subjective: No significant 24 hour events reported. He is more restless this morning, moving around in bed. He is alert but confused. He is talking more but remains very confused. He follows commands intermittently. He did not participate with ROS for me. EVD clamped this morning by neurosurgery. CSF culture from 6/5 growing staph epidermidis so linezolid added yesterday by ID.   Medications reviewed and reconciled.   Pertinent meds: Amphotericin B 250 mg every 24 hours Azithromycin 1200 mg weekly Ceftriaxone 2 g every 24 hours Diphenhydramine 25 mg every 24 hours Doxycycline 100 mg every 12 hours Flucytosine 1500 mg every 6 hours Keppra 1000 mg every 12 hours Bactrim 10 mL daily  Current Meds:   Current Facility-Administered Medications:  .  0.9 %  sodium chloride infusion, 250 mL, Intravenous, PRN, Wilber OliphantPanchal, Amar, MD, Last Rate: 10 mL/hr at 05/31/17 0700, 250 mL at 05/31/17 0700 .  acetaminophen (TYLENOL) tablet 650 mg, 650 mg, Oral, Q4H PRN, Wilber OliphantPanchal, Amar, MD .  acetaminophen (TYLENOL) tablet 650 mg, 650 mg, Oral, Q24H, Daiva EvesVan Dam, Lisette Grinderornelius N, MD, 650 mg at 05/30/17 1810 .  albuterol (PROVENTIL) (2.5 MG/3ML) 0.083% nebulizer solution 2.5 mg, 2.5 mg, Nebulization, Q2H PRN, Wilber OliphantPanchal, Amar, MD .  amphotericin B liposome (AMBISOME) 250 mg in dextrose 5 % 500 mL IVPB, 4 mg/kg, Intravenous, Q24H, Shaune PollackIsaacs, Cameron, MD, Stopped at 05/30/17 1924 .  azithromycin (ZITHROMAX) 200 MG/5ML suspension 1,200 mg, 1,200 mg, Per Tube, Weekly, Judyann MunsonSnider, Cynthia, MD, 1,200 mg at 05/31/17 0942 .  cefTRIAXone (ROCEPHIN) 2 g in dextrose 5 % 50 mL IVPB, 2 g, Intravenous, Q24H, Judyann MunsonSnider, Cynthia, MD, Stopped at 05/31/17 0533 .  chlorhexidine (PERIDEX) 0.12 % solution 15 mL, 15 mL, Mouth Rinse, BID, Roslynn AmbleNestor, Jennings E, MD, 15 mL at 05/31/17 0945 .  Chlorhexidine Gluconate Cloth 2 % PADS 6 each, 6 each, Topical, Daily, Leslye PeerByrum, Robert S, MD, 6 each at 05/30/17 1209 .  dextrose 5 %-0.45 % sodium  chloride infusion, , Intravenous, Continuous, Rumbarger, Faye RamsayRachel L, RPH, Last Rate: 50 mL/hr at 05/31/17 0800 .  diphenhydrAMINE (BENADRYL) capsule 25 mg, 25 mg, Oral, Q24H, Daiva EvesVan Dam, Lisette Grinderornelius N, MD, 25 mg at 05/30/17 1810 .  doxycycline (VIBRAMYCIN) 100 mg in dextrose 5 % 250 mL IVPB, 100 mg, Intravenous, Q12H, Judyann MunsonSnider, Cynthia, MD, Stopped at 05/31/17 0149 .  feeding supplement (VITAL AF 1.2 CAL) liquid 1,000 mL, 1,000 mL, Per Tube, Continuous, Roslynn AmbleNestor, Jennings E, MD, Last Rate: 75 mL/hr at 05/31/17 0600, 1,000 mL at 05/31/17 0600 .  flucytosine (ANCOBON) capsule 1,500 mg, 1,500 mg, Per Tube, Q6H, Coralyn HellingSood, Vineet, MD, 1,500 mg at 05/31/17 0856 .  insulin aspart (novoLOG) injection 2-6 Units, 2-6 Units, Subcutaneous, Q4H, Wilber OliphantPanchal, Amar, MD, Stopped at 05/29/17 0411 .  levETIRAcetam (KEPPRA) 1,000 mg in sodium chloride 0.9 % 100 mL IVPB, 1,000 mg, Intravenous, Q12H, Rejeana BrockKirkpatrick, McNeill P, MD, Last Rate: 440 mL/hr at 05/31/17 0933, 1,000 mg at 05/31/17 0933 .  linezolid (ZYVOX) IVPB 600 mg, 600 mg, Intravenous, Q12H, Gardiner Barefootomer, Robert W, MD, Last Rate: 300 mL/hr at 05/31/17 0934, 600 mg at 05/31/17 0934 .  MEDLINE mouth rinse, 15 mL, Mouth Rinse, q12n4p, Roslynn AmbleNestor, Jennings E, MD, 15 mL at 05/30/17 1539 .  metoprolol tartrate (LOPRESSOR) injection 2.5-5 mg, 2.5-5 mg, Intravenous, Q3H PRN, Kalman Shanamaswamy, Murali, MD, 5 mg at 05/30/17 0146 .  metoprolol tartrate (LOPRESSOR) injection 5 mg, 5 mg, Intravenous, Q8H, Deterding, Dorise HissElizabeth C, MD, 5 mg at 05/31/17 0504 .  ondansetron (ZOFRAN) injection 4 mg, 4 mg,  Intravenous, Q6H PRN, Wilber Oliphant, MD .  potassium chloride 20 MEQ/15ML (10%) solution 40 mEq, 40 mEq, Oral, Daily, Rumbarger, Faye Ramsay, RPH, 40 mEq at 05/31/17 0940 .  sodium chloride 0.9 % bolus 1,000 mL, 1,000 mL, Intravenous, Q24H, Dixon, Stephanie N, NP, 1,000 mL at 05/30/17 2133 .  sodium chloride 0.9 % bolus 1,000 mL, 1,000 mL, Intravenous, Q24H, Dixon, Stephanie N, NP, 1,000 mL at 05/30/17 2121 .  sodium  chloride flush (NS) 0.9 % injection 10-40 mL, 10-40 mL, Intracatheter, PRN, Leslye Peer, MD .  sulfamethoxazole-trimethoprim (BACTRIM,SEPTRA) 200-40 MG/5ML suspension 10 mL, 10 mL, Per Tube, Daily, Coralyn Helling, MD, 10 mL at 05/31/17 0940  Objective:  Temp:  [97.6 F (36.4 C)-98.3 F (36.8 C)] 97.8 F (36.6 C) (06/07 0800) Pulse Rate:  [90-122] 122 (06/07 0800) Resp:  [13-23] 19 (06/07 0800) BP: (93-143)/(43-84) 143/83 (06/07 0800) SpO2:  [100 %] 100 % (06/07 0800) Weight:  [60.6 kg (133 lb 9.6 oz)] 60.6 kg (133 lb 9.6 oz) (06/07 0500)  General: WD AA man lying in bed. He is awake and will attend to me when I address him from either side. He keeps asking me what I am doing here. He is oriented to self only. Speech is dysarthric. Sustained attention is poor. He tracked my finger and stuck out his tongue to command but did not follow any appendicular commands.  HEENT: EVD in place. NGT in place. Sclerae are anicteric. There is no conjunctival injection.  CV: Regular, tachy, no obvious murmur. Carotid pulses are 2+ and symmetric with no bruits. Distal pulses 2+ and symmetric.  Lungs: CTAB on anterior auscultation.  Extremities: No C/C/E. Neuro: MS: As noted above.  CN: Pupils are equal and sluggishly reactive from 4-->2 mm bilaterally. He blinks to threat. Eyes are conjugate, no forced deviation and no nystagmus. EOMI with breakup of smooth pursuits. No nystagmus. His face appears to be grossly symmetric. He has a symmetric grimace. Tongue protrudes to midline. The remainder of his cranial nerve exam is limited by inability to participate with the exam.  Motor: Thin bulk diffusely. He has normal tone. He has gegenhalten paratonia. He does not participate with confrontational strength testing. He has spontaneous movement of the arms and legs with at least 4/5 strength throughout, no obvious focal weakness. No tremor or other abnormal movements are observed.  Sensation: He grimaces and  withdraws from nailbed pressure x4.  DTRs: 3+, symmetric. Toes are mute bilaterally.  Coordination/gait:  Unable to assess as he does not participate with the exam.    Labs: Lab Results  Component Value Date   WBC 2.5 (L) 05/31/2017   HGB 10.1 (L) 05/31/2017   HCT 30.6 (L) 05/31/2017   PLT 301 05/31/2017   GLUCOSE 169 (H) 05/31/2017   CHOL 125 09/28/2016   TRIG 150 (H) 05/26/2017   HDL 31 (L) 09/28/2016   LDLCALC 70 09/28/2016   ALT 95 (H) 05/31/2017   AST 93 (H) 05/31/2017   NA 132 (L) 05/31/2017   K 4.0 05/31/2017   CL 99 (L) 05/31/2017   CREATININE 0.63 05/31/2017   BUN 18 05/31/2017   CO2 23 05/31/2017   INR 1.20 05/20/2017   CBC Latest Ref Rng & Units 05/31/2017 05/30/2017 05/29/2017  WBC 4.0 - 10.5 K/uL 2.5(L) 2.5(L) 3.7(L)  Hemoglobin 13.0 - 17.0 g/dL 10.1(L) 8.2(L) 10.1(L)  Hematocrit 39.0 - 52.0 % 30.6(L) 26.3(L) 32.5(L)  Platelets 150 - 400 K/uL 301 215 294    No results found for:  HGBA1C Lab Results  Component Value Date   ALT 95 (H) 05/31/2017   AST 93 (H) 05/31/2017   ALKPHOS 223 (H) 05/31/2017   BILITOT 0.7 05/31/2017   Mg 1.2 CD4 count 2 HIV viral load 83,400  CSF culture from 05/29/17: yeast, few staph epidermidis  from 05/20/17: CSF WBCs 14, 95% lymphocytes CSF RBCs 3 CSF protein 39 CSF glucose 62 CSF Gram stain showed fungal elements CSF culture positive for cryptococcus neoformans  CSF cryptococcal antigen positive with titer greater than 2560 CSF VDRL nonreactive CSF HSV PCR negative   Radiology:  There is no new neuroimaging.   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 This has been complicated by increased ICP and early herniation which resolved with mannitol and CSF diversion. EVD in place, clamped today by neurosurgery and may be d/c'd tomorrow if he remains stable today. Repeat CSF cx still showing yeast so per ID continuing induction therapy with amphotericin and flucytosine; appreciate  ID assistance. CSF also growing staph epidermidis so now on linezolid. Repeat CSF cx 6/9.   2. Acute ischemic infarction: He has numerous scattered areas of ischemia on MRI. These are consistent with vascular involvement by cryptococcal meningitis. Continue antifungals. There is no role for antiplatelet agents or statins in this context.   3. Acute encephalopathy: This is likely multifactorial with contributions from cryptococcal meningitis, ischemic infarctions, respiratory failure, medication effect, metabolic derangements (hyponatremia, hepatic dysfunction). He continues to show slight improvement in level of alertness and interaction. Continue with supportive care. Continue to optimize metabolic status as you are. Continue to treat underlying infections as needed. Continue to wean sedation as tolerated, minimizing the use of opiates, benzos, and anything with strong anticholinergic properties.  4. Seizures: He had seizure activity on presentation, none since. No reported prior history of seizure. Continue Keppra. This was likely provoked by his cryptococcal meningitis. Seizure precautions.  5. HIV/AIDS: Current CD4 count is 2. Appreciate ID assistance. Antiretroviral therapy is on hold given acute cryptococcal infection.  No family was present at the time of my visit.   Rhona Leavens, MD [facility]

## 2017-06-01 LAB — CBC WITH DIFFERENTIAL/PLATELET
BASOS ABS: 0 10*3/uL (ref 0.0–0.1)
Basophils Relative: 1 %
Eosinophils Absolute: 0 10*3/uL (ref 0.0–0.7)
Eosinophils Relative: 2 %
HCT: 32.1 % — ABNORMAL LOW (ref 39.0–52.0)
Hemoglobin: 10.2 g/dL — ABNORMAL LOW (ref 13.0–17.0)
LYMPHS ABS: 0.3 10*3/uL — AB (ref 0.7–4.0)
LYMPHS PCT: 15 %
MCH: 28.8 pg (ref 26.0–34.0)
MCHC: 31.8 g/dL (ref 30.0–36.0)
MCV: 90.7 fL (ref 78.0–100.0)
MONO ABS: 0.2 10*3/uL (ref 0.1–1.0)
MONOS PCT: 11 %
NEUTROS ABS: 1.3 10*3/uL — AB (ref 1.7–7.7)
Neutrophils Relative %: 71 %
Platelets: 327 10*3/uL (ref 150–400)
RBC: 3.54 MIL/uL — ABNORMAL LOW (ref 4.22–5.81)
RDW: 13.5 % (ref 11.5–15.5)
WBC: 1.8 10*3/uL — ABNORMAL LOW (ref 4.0–10.5)

## 2017-06-01 LAB — MAGNESIUM: MAGNESIUM: 1.3 mg/dL — AB (ref 1.7–2.4)

## 2017-06-01 LAB — GLUCOSE, CAPILLARY
GLUCOSE-CAPILLARY: 95 mg/dL (ref 65–99)
GLUCOSE-CAPILLARY: 90 mg/dL (ref 65–99)
GLUCOSE-CAPILLARY: 85 mg/dL (ref 65–99)
Glucose-Capillary: 92 mg/dL (ref 65–99)
Glucose-Capillary: 105 mg/dL — ABNORMAL HIGH (ref 65–99)
Glucose-Capillary: 100 mg/dL — ABNORMAL HIGH (ref 65–99)

## 2017-06-01 LAB — RENAL FUNCTION PANEL
Albumin: 2.2 g/dL — ABNORMAL LOW (ref 3.5–5.0)
Anion gap: 5 (ref 5–15)
BUN: 14 mg/dL (ref 6–20)
CHLORIDE: 106 mmol/L (ref 101–111)
CO2: 20 mmol/L — ABNORMAL LOW (ref 22–32)
Calcium: 8.5 mg/dL — ABNORMAL LOW (ref 8.9–10.3)
Creatinine, Ser: 0.54 mg/dL — ABNORMAL LOW (ref 0.61–1.24)
GFR calc Af Amer: >60 mL/min (ref >60)
Glucose, Bld: 104 mg/dL — ABNORMAL HIGH (ref 65–99)
Phosphorus: 5.1 mg/dL — ABNORMAL HIGH (ref 2.5–4.6)
Potassium: 4 mmol/L (ref 3.5–5.1)
Sodium: 131 mmol/L — ABNORMAL LOW (ref 135–145)

## 2017-06-01 MED ORDER — LORAZEPAM 2 MG/ML IJ SOLN
1.0000 mg | INTRAMUSCULAR | Status: DC | PRN
  Administered 2017-06-01 – 2017-06-16 (×52): 1 mg via INTRAVENOUS
  Filled 2017-06-01 (×52): qty 1

## 2017-06-01 MED ORDER — MAGNESIUM SULFATE 50 % IJ SOLN
6.0000 g | Freq: Once | INTRAVENOUS | Status: AC
  Administered 2017-06-01: 6 g via INTRAVENOUS
  Filled 2017-06-01: qty 12

## 2017-06-01 MED ORDER — MIDAZOLAM HCL 2 MG/2ML IJ SOLN
0.5000 mg | INTRAMUSCULAR | Status: DC | PRN
  Administered 2017-06-01: 0.5 mg via INTRAVENOUS
  Filled 2017-06-01 (×2): qty 2

## 2017-06-01 NOTE — Progress Notes (Signed)
Neurology Progress Note  Subjective: Per RN he seems better this morning. He is talking more and was able to eat about 30% of his breakfast. PT worked with him and he is now sitting in a chair. EVD clamped since yesterday, will likely be removed today. The patient is currently denying any pain or discomfort. He appears restless, however. Sustained attention remains poor and limits the interview and exam.    Medications reviewed and reconciled.   Pertinent meds: Amphotericin B 250 mg every 24 hours Azithromycin 1200 mg weekly Diphenhydramine 25 mg every 24 hours Doxycycline 100 mg every 12 hours Flucytosine 1500 mg every 6 hours Keppra 1000 mg every 12 hours Bactrim 10 mL daily  Current Meds:   Current Facility-Administered Medications:  .  0.9 %  sodium chloride infusion, 250 mL, Intravenous, PRN, Wilber Oliphant, MD, Last Rate: 10 mL/hr at 06/01/17 0700, 250 mL at 06/01/17 0700 .  acetaminophen (TYLENOL) tablet 650 mg, 650 mg, Oral, Q4H PRN, Marylouise Stacks, Amar, MD .  acetaminophen (TYLENOL) tablet 650 mg, 650 mg, Oral, Q24H, [name], Lisette Grinder, MD, 650 mg at 05/31/17 1626 .  albuterol (PROVENTIL) (2.5 MG/3ML) 0.083% nebulizer solution 2.5 mg, 2.5 mg, Nebulization, Q2H PRN, Panchal, Amar, MD .  amphotericin B liposome (AMBISOME) 250 mg in dextrose 5 % 500 mL IVPB, 4 mg/kg, Intravenous, Q24H, Shaune Pollack, MD, Stopped at 05/31/17 1945 .  azithromycin (ZITHROMAX) 200 MG/5ML suspension 1,200 mg, 1,200 mg, Per Tube, Weekly, Judyann Munson, MD, 1,200 mg at 05/31/17 0942 .  chlorhexidine (PERIDEX) 0.12 % solution 15 mL, 15 mL, Mouth Rinse, BID, Roslynn Amble, MD, 15 mL at 06/01/17 1040 .  Chlorhexidine Gluconate Cloth 2 % PADS 6 each, 6 each, Topical, Daily, Leslye Peer, MD, 6 each at 05/31/17 1100 .  dextrose 5 %-0.45 % sodium chloride infusion, , Intravenous, Continuous, Rumbarger, Faye Ramsay, RPH, Last Rate: 50 mL/hr at 06/01/17 0900 .  diphenhydrAMINE (BENADRYL) capsule 25 mg, 25 mg,  Oral, Q24H, [name], Lisette Grinder, MD, 25 mg at 05/31/17 1623 .  doxycycline (VIBRAMYCIN) 100 mg in dextrose 5 % 250 mL IVPB, 100 mg, Intravenous, Q12H, Judyann Munson, MD, Last Rate: 125 mL/hr at 06/01/17 1240, 100 mg at 06/01/17 1240 .  feeding supplement (VITAL AF 1.2 CAL) liquid 1,000 mL, 1,000 mL, Per Tube, Continuous, Roslynn Amble, MD, Last Rate: 75 mL/hr at 06/01/17 0730, 1,000 mL at 06/01/17 0730 .  flucytosine (ANCOBON) capsule 1,500 mg, 1,500 mg, Per Tube, Q6H, Coralyn Helling, MD, 1,500 mg at 06/01/17 0729 .  insulin aspart (novoLOG) injection 2-6 Units, 2-6 Units, Subcutaneous, Q4H, Wilber Oliphant, MD, 4 Units at 05/31/17 2026 .  levETIRAcetam (KEPPRA) 1,000 mg in sodium chloride 0.9 % 100 mL IVPB, 1,000 mg, Intravenous, Q12H, Rejeana Brock, MD, Stopped at 06/01/17 1109 .  linezolid (ZYVOX) IVPB 600 mg, 600 mg, Intravenous, Q12H, Comer, Belia Heman, MD, Stopped at 06/01/17 1153 .  MEDLINE mouth rinse, 15 mL, Mouth Rinse, q12n4p, Roslynn Amble, MD, 15 mL at 06/01/17 1240 .  metoprolol tartrate (LOPRESSOR) injection 2.5-5 mg, 2.5-5 mg, Intravenous, Q3H PRN, Kalman Shan, MD, 5 mg at 05/31/17 1222 .  metoprolol tartrate (LOPRESSOR) injection 5 mg, 5 mg, Intravenous, Q8H, Deterding, Dorise Hiss, MD, 5 mg at 06/01/17 0610 .  midazolam (VERSED) injection 0.5 mg, 0.5 mg, Intravenous, Q2H PRN, Desai, Rahul P, PA-C .  ondansetron (ZOFRAN) injection 4 mg, 4 mg, Intravenous, Q6H PRN, Panchal, Amar, MD .  potassium chloride 20 MEQ/15ML (10%) solution 40  mEq, 40 mEq, Oral, Daily, Rumbarger, Faye Ramsay, RPH, 40 mEq at 06/01/17 1054 .  sodium chloride 0.9 % bolus 1,000 mL, 1,000 mL, Intravenous, Q24H, Dixon, Stephanie N, NP, 1,000 mL at 06/01/17 0025 .  sodium chloride 0.9 % bolus 1,000 mL, 1,000 mL, Intravenous, Q24H, Dixon, Stephanie N, NP, 1,000 mL at 05/31/17 1627 .  sodium chloride flush (NS) 0.9 % injection 10-40 mL, 10-40 mL, Intracatheter, PRN, Leslye Peer, MD .   sulfamethoxazole-trimethoprim (BACTRIM,SEPTRA) 200-40 MG/5ML suspension 10 mL, 10 mL, Per Tube, Daily, Craige Cotta, Vineet, MD, 10 mL at 06/01/17 1054  Objective:  Temp:  [96.9 F (36.1 C)-97.9 F (36.6 C)] 97.9 F (36.6 C) (06/08 1159) Pulse Rate:  [66-124] 124 (06/08 1100) Resp:  [12-26] 16 (06/08 1100) BP: (86-151)/(44-127) 143/72 (06/08 1100) SpO2:  [65 %-100 %] 98 % (06/08 1100) Weight:  [64.3 kg (141 lb 12.1 oz)] 64.3 kg (141 lb 12.1 oz) (06/08 0328)  General: WD AA man reclining in chair. He is awake and fixes and tracks well. He remains disoriented.   Speech is dysarthric. Sustained attention is poor. He followed limited commands for me.   HEENT: EVD in place. NGT in place. Sclerae are anicteric. There is no conjunctival injection.  CV: Regular, tachy, no obvious murmur. Carotid pulses are 2+ and symmetric with no bruits. Distal pulses 2+ and symmetric.  Lungs: CTAB on anterior auscultation.  Extremities: No C/C/E. Neuro: MS: As noted above.  CN: Pupils are equal and sluggishly reactive from 4-->2 mm bilaterally. He blinks to threat. Eyes are conjugate, no forced deviation and no nystagmus. EOMI with breakup of smooth pursuits. No nystagmus. His face appears to be grossly symmetric. He has a symmetric grimace. Tongue protrudes to midline. The remainder of his cranial nerve exam is limited by inability to participate with the exam.  Motor: Thin bulk diffusely. He has normal tone. He has gegenhalten paratonia. He does not participate with confrontational strength testing. He has spontaneous movement of the arms and legs with at least 4/5 strength throughout, no obvious focal weakness. No tremor or other abnormal movements are observed.  Sensation: He grimaces and withdraws from nailbed pressure x4.  DTRs: 3+, symmetric. Toes are mute bilaterally.  Coordination/gait:  Unable to assess as he does not participate with the exam.    Labs: Lab Results  Component Value Date   WBC 1.8 (L)  06/01/2017   HGB 10.2 (L) 06/01/2017   HCT 32.1 (L) 06/01/2017   PLT 327 06/01/2017   GLUCOSE 104 (H) 06/01/2017   CHOL 125 09/28/2016   TRIG 150 (H) 05/26/2017   HDL 31 (L) 09/28/2016   LDLCALC 70 09/28/2016   ALT 95 (H) 05/31/2017   AST 93 (H) 05/31/2017   NA 131 (L) 06/01/2017   K 4.0 06/01/2017   CL 106 06/01/2017   CREATININE 0.54 (L) 06/01/2017   BUN 14 06/01/2017   CO2 20 (L) 06/01/2017   INR 1.20 05/20/2017   CBC Latest Ref Rng & Units 06/01/2017 05/31/2017 05/30/2017  WBC 4.0 - 10.5 K/uL 1.8(L) 2.5(L) 2.5(L)  Hemoglobin 13.0 - 17.0 g/dL 10.2(L) 10.1(L) 8.2(L)  Hematocrit 39.0 - 52.0 % 32.1(L) 30.6(L) 26.3(L)  Platelets 150 - 400 K/uL 327 301 215    No results found for: HGBA1C Lab Results  Component Value Date   ALT 95 (H) 05/31/2017   AST 93 (H) 05/31/2017   ALKPHOS 223 (H) 05/31/2017   BILITOT 0.7 05/31/2017   Mg 1.2 CD4 count 2 HIV viral load 83,400  CSF culture from 05/29/17: yeast, few staph epidermidis  from 05/20/17: CSF WBCs 14, 95% lymphocytes CSF RBCs 3 CSF protein 39 CSF glucose 62 CSF Gram stain showed fungal elements CSF culture positive for cryptococcus neoformans  CSF cryptococcal antigen positive with titer greater than 2560 CSF VDRL nonreactive CSF HSV PCR negative   Radiology:  There is no new neuroimaging.   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 EVD in place, will likely be d/c'd today. Continuing induction therapy with amphotericin and flucytosine; appreciate ID assistance. CSF cx with staph epidermidis, on linezolid. Repeat CSF studies ordered.   2. Acute ischemic infarction: He has numerous scattered areas of ischemia on MRI. These are consistent with vascular involvement by cryptococcal meningitis. Continue antifungals. There is no role for antiplatelet agents or statins in this context. Will check TTE.   3. Acute encephalopathy: This is likely multifactorial with contributions  from cryptococcal meningitis, ischemic infarctions, respiratory failure, medication effect, metabolic derangements (hyponatremia, hepatic dysfunction). He continues to show slight improvement in level of alertness and interaction. Continue with supportive care. Continue to optimize metabolic status as you are. Continue to treat underlying infections as needed. Continue to wean sedation as tolerated, minimizing the use of opiates, benzos, and anything with strong anticholinergic properties.  4. Seizures: He had seizure activity on presentation, none since. No reported prior history of seizure. Continue Keppra. This was likely provoked by his cryptococcal meningitis. Seizure precautions.  5. HIV/AIDS: Current CD4 count is 2. Appreciate ID assistance. Antiretroviral therapy is on hold given acute cryptococcal infection.  No family was present at the time of my visit.   Rhona Leavensimothy Oster, MD [facility]

## 2017-06-01 NOTE — Progress Notes (Addendum)
[facility] Attending Note  ADMISSION DATE:  05/20/2017  CHIEF COMPLAINT:  Seizures, altered MS.   Presenting HPI:  26 y.o. male with male with history ofHIV AIDS with history of noncompliance here with altered mental status. History is limited as patient was pulled out of his car unresponsive. Family did report that he thought the patient had a seizure. On arrival, patient is postictal and unable to provide history. Per review of records, patient was seen 3 days ago in the ER for headache. He was afebrile and otherwise very well-appearing at that time. He had a CT scan which was negative and sent home with supportive care.  Subjective:  Int aggitation on precedex Drain removed   Objective Temp:  [96.9 F (36.1 C)-97.9 F (36.6 C)] 97.9 F (36.6 C) (06/08 1159) Pulse Rate:  [66-124] 124 (06/08 1100) Resp:  [12-26] 16 (06/08 1100) BP: (86-151)/(44-127) 143/72 (06/08 1100) SpO2:  [65 %-100 %] 98 % (06/08 1100) Weight:  [64.3 kg (141 lb 12.1 oz)] 64.3 kg (141 lb 12.1 oz) (06/08 0328)  Intake/Output Summary (Last 24 hours) at 06/01/17 1227 Last data filed at 06/01/17 0900  Gross per 24 hour  Intake          5560.49 ml  Output             5680 ml  Net          -119.51 ml    General: awake, int agitation in chair Neuro: nonfocal, moving al ext HEENT: jvd wnl PULM: CTA , good air entry CV: s1 s2 RRR int tachy GI: soft, bs wnl, no r Extremities: no edema  CBC Recent Labs     05/30/17  0445  05/31/17  0255  06/01/17  0316  WBC  2.5*  2.5*  1.8*  HGB  8.2*  10.1*  10.2*  HCT  26.3*  30.6*  32.1*  PLT  215  301  327    Coag's No results for input(s): APTT, INR in the last 72 hours.  BMET Recent Labs     05/31/17  0508  05/31/17  0801  06/01/17  0316  NA  127*  132*  131*  K  5.8*  4.0  4.0  CL  103  99*  106  CO2  21*  23  20*  BUN  17  18  14   CREATININE  0.61  0.63  0.54*  GLUCOSE  480*  169*  104*    Electrolytes Recent Labs   05/30/17  0600  05/30/17  1713  05/31/17  0255  05/31/17  0508  05/31/17  0801  06/01/17  0316  CALCIUM  9.0   --    --   7.7*  8.5*  8.5*  MG  1.2*  1.8  2.2   --    --   1.3*  PHOS  4.8*   --    --   3.7   --   5.1*    Sepsis Markers No results for input(s): PROCALCITON, O2SATVEN in the last 72 hours.  Invalid input(s): LACTICACIDVEN  ABG No results for input(s): PHART, PCO2ART, PO2ART in the last 72 hours.  Liver Enzymes Recent Labs     05/31/17  0508  05/31/17  0801  06/01/17  0316  AST   --   93*   --   ALT   --   95*   --   ALKPHOS   --   223*   --  BILITOT   --   0.7   --   ALBUMIN  2.2*  2.4*  2.2*    Cardiac Enzymes No results for input(s): TROPONINI, PROBNP in the last 72 hours.  Glucose Recent Labs     05/31/17  1545  05/31/17  2008  05/31/17  2355  06/01/17  0346  06/01/17  0811  06/01/17  1140  GLUCAP  101*  159*  116*  92  105*  100*    Imaging No results found.  IMAGING/STUDIES: MRI BRAIN W/O 6/1: IMPRESSION: 1. Multifocal cortical diffusion restriction involving both cerebellar hemispheres and the bilateral temporal, occipital and frontal lobes, as well as the deep gray nuclei. 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 Postictal changes could have a similar appearance, but the pattern is more typical of hypoxic ischemic injury. 2. Areas of subarachnoid or leptomeningeal contrast enhancement at the sites of diffusion restriction may be secondary to blood-brain barrier breakdown in the setting of a subacute ischemic event. In the context of HIV, however, an infectious meningitis should also be considered. 3. Small amount of blood adjacent to the right frontal approach extraventricular drain. LINES/TUBES: OETT 5/27 - 6/3 OGT 5/27 - 6/3 NGT >>> Right Ventriculostomy 5/28 >>>6/8 LUE TL PICC 6/3 >>> Foley 6/4 >>> PIV  PORT CXR 6/3:  No active disease.  MICROBIOLOGY: MRSA PCR 5/27:  Negative  Blood Cultures x2  5/27:  1/2 Bottles Positive Streptococcus pneumoniae  Blood Fungal Culture 5/27:  Negative  CSF Culture 5/27:  Cryptococcus neoformans  Tracheal Aspirate Culture 5/28:  MODERATE BORDETELLA BRONCHISEPTICA  Blood Cultures x2 5/29:  Negative  Gastrointestinal PCR 5/31:  Campylobacter / E coli (shiga-like toxin producing) / Giardia lamblia / Sapovirus  CSF Culture 6/5 >>> Yeast & few GPC>>>  ANTIBIOTICS: Ambisome 5/27 >>> Flucytosine 5/27 >>> Rocephin 5/27 >>> Bactrim 5/27 >>> Azithromycin qweek 5/31 >>> Doxycycline 6/2 >>> linezolid 6/6>>>  SIGNIFICANT EVENTS: 05/27 - Admit 05/28 - Urgent right ventriculostomy placed for acute decompensation with fixed right pupil   ASSESSMENT/PLAN:   26 y.o.  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 Encephalopathy persists. ID, neurology, and neurosurgery following. Appreciate assistance.   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 -neurology following Plan Keppra  Avoid haldol  Streptococcus pneumoniae bacteremia:  Plan Rocephin off per ID zyvox  Bordetella pneumonia:  Plan Doxy per ID Off contact per ID  Fluid and Electrolyte imbalance: Hyperkalemia and progressive hyponatremia ? Lab error?? Plan With leukopenia further Limit volume now Ensure kvo With ampho avoiding lasix for now  Hyperglycemia ? Lab error from am chemistry??? Plan WNL Diet , may need calorie count  Chronic hepatitis B viral infection: Holding on treatment  Plan Trend LFTs  HIV/AIDS: Plan Cont bactrim and azith for prophylaxis Holding ARVs while being treated for CMV  Anemia ,  leukopenia Plan Follow side effects all antimicrobials Cbc in am   I updated family in full  Loose stools Consider mod acid fast ID work up  Ccm time 30 min   [name]. Tyson Alias, MD, FACP Pgr: [phone] [facility] 06/01/2017 12:27 PM

## 2017-06-01 NOTE — Evaluation (Signed)
Physical Therapy Evaluation Patient Details Name: Justin Ball R Grismer MRN: [id number] DOB: 03/13/1991 Today's Date: 06/01/2017   History of Present Illness  Pt is a 26 year old man admitted s/p seizure activity having several days of recent headaches. 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 EVD was placed after acute decompensation on 5/28 with decorticate posturing and asymmetric pupils. 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 PMH includes: syphilis, substance abuse, soft palate ulceration, scabies, neuromuscular disorder, HIV/AIDS, depression, bipolar disorder, anxiety  Clinical Impression  Pt admitted with/for seizure, encephalopathy, meningitis.  Pt needing max to total of 2 for safety with general mobility.  Pt currently limited functionally due to the problems listed. ( See problems list.)   Pt will benefit from PT to maximize function and safety in order to get ready for next venue listed below.     Follow Up Recommendations CIR    Equipment Recommendations  Other (comment) (TBA)    Recommendations for Other Services Rehab consult     Precautions / Restrictions Precautions Precautions: Fall      Mobility  Bed Mobility Overal bed mobility: Needs Assistance Bed Mobility: Supine to Sit     Supine to sit: Total assist;+2 for physical assistance     General bed mobility comments: multimodal cues for initiation an follow through via L side, pt assisting minimally with R UE  Transfers Overall transfer level: Needs assistance Equipment used: 2 person hand held assist Transfers: Sit to/from Stand;Stand Pivot Transfers Sit to Stand: Max assist;+2 physical assistance Stand pivot transfers: Max assist;+2 physical assistance       General transfer comment: pt listing heavily Left, more weight on L LE, pt not assisting in pivot with stepping.  Ambulation/Gait             General Gait Details: not  tested.  Stairs            Wheelchair Mobility    Modified Rankin (Stroke Patients Only) Modified Rankin (Stroke Patients Only) Pre-Morbid Rankin Score: No symptoms Modified Rankin: Severe disability     Balance Overall balance assessment: Needs assistance Sitting-balance support: Single extremity supported;Bilateral upper extremity supported;Feet supported Sitting balance-Leahy Scale: Poor Sitting balance - Comments: sat EOB x 15 min working on trunk control with improvements in activation, but pt unable to controll full in any direction.     Standing balance-Leahy Scale: Zero Standing balance comment: pt unable to control trunk and needed max/total assist for upright posture in standing.                             Pertinent Vitals/Pain Pain Assessment: Faces Faces Pain Scale: No hurt    Home Living Family/patient expects to be discharged to:: Private residence Living Arrangements: Other relatives                    Prior Function Level of Independence: Independent               Hand Dominance        Extremity/Trunk Assessment   Upper Extremity Assessment Upper Extremity Assessment: Defer to OT evaluation    Lower Extremity Assessment Lower Extremity Assessment: Generalized weakness;RLE deficits/detail;LLE deficits/detail RLE Deficits / Details: unable to assess formally due to arousal/mentation.  pt mimicked movement that suggest general weakness bil RLE Coordination: decreased fine motor LLE Deficits / Details: not formally tested, spontaneous and on command movement suggests general weakness.  Pt  bore some weight in standing, L >R LE LLE Coordination: decreased fine motor       Communication   Communication: Other (comment) (pt spoke softly, but this therapist didn't catch the context)  Cognition Arousal/Alertness: Lethargic;Awake/alert Behavior During Therapy: Restless;Flat affect Overall Cognitive Status:  Impaired/Different from baseline Area of Impairment: Orientation;Attention;Following commands;Safety/judgement;Awareness;Problem solving                 Orientation Level: Place;Time;Situation Current Attention Level: Focused   Following Commands: Follows one step commands inconsistently;Follows one step commands with increased time Safety/Judgement: Decreased awareness of safety;Decreased awareness of deficits Awareness: Intellectual Problem Solving: Slow processing;Decreased initiation;Difficulty sequencing        General Comments General comments (skin integrity, edema, etc.): HR/EHR up as high as 140's sustained, but generally in the 120's.  SpO2 in upper 90's    Exercises     Assessment/Plan    PT Assessment Patient needs continued PT services  PT Problem List Decreased strength;Decreased activity tolerance;Decreased balance;Decreased mobility;Decreased coordination;Decreased cognition;Decreased safety awareness       PT Treatment Interventions DME instruction;Functional mobility training;Therapeutic activities;Therapeutic exercise;Balance training;Neuromuscular re-education;Patient/family education;Gait training    PT Goals (Current goals can be found in the Care Plan section)  Acute Rehab PT Goals Patient Stated Goal: pt unable PT Goal Formulation: Patient unable to participate in goal setting Time For Goal Achievement: 06/15/17 Potential to Achieve Goals: Fair    Frequency Min 4X/week   Barriers to discharge        Co-evaluation               AM-PAC PT "6 Clicks" Daily Activity  Outcome Measure Difficulty turning over in bed (including adjusting bedclothes, sheets and blankets)?: Total Difficulty moving from lying on back to sitting on the side of the bed? : Total Difficulty sitting down on and standing up from a chair with arms (e.g., wheelchair, bedside commode, etc,.)?: Total Help needed moving to and from a bed to chair (including a  wheelchair)?: Total Help needed walking in hospital room?: Total Help needed climbing 3-5 steps with a railing? : Total 6 Click Score: 6    End of Session   Activity Tolerance: Patient tolerated treatment well Patient left: in chair;with call bell/phone within reach;with chair alarm set;Other (comment) (mitts reapplied) Nurse Communication: Mobility status PT Visit Diagnosis: Other symptoms and signs involving the nervous system (R29.898);Muscle weakness (generalized) (M62.81)    Time: 1610-9604 PT Time Calculation (min) (ACUTE ONLY): 35 min   Charges:   PT Evaluation $PT Eval High Complexity: 1 Procedure     PT G Codes:        06/22/2017  [place] Bing, PT [phone]  (pager)  Eliseo Gum Kaden Dunkel 2017-06-22, 12:10 PM

## 2017-06-01 NOTE — Progress Notes (Signed)
Pharmacy Consult Note:  Electrolyte Replacement  26 yom currently on treatment for cryptococcal meningitis and strep pneumo bacteremia. Pharmacy consulted by ID to manage electrolyte replacement. SCr remains WNL at 0.54.   K WNL on KCl 40meq daily Mag low again at 1.3  Plan: Continue daily potassium Magnesium 6gm IV x 1 F/u AM lytes  Lysle Pearlachel Abimbola Aki, PharmD, BCPS 06/01/2017 7:32 AM

## 2017-06-01 NOTE — Consult Note (Addendum)
Physical Medicine and Rehabilitation Consult   Reason for Consult: Cryptococcal meningitis Referring Physician: Dr. Bary Richard    HPI: Justin Ball is a 26 y.o. male with history of advanced HIV, syphilis bipolar disorder who was admitted on 05/20/17 with AMS,headaches and seizures. History taken from chart review and grandmother. He was intubated for airway protection and EEG orddered to rule out status epilepticus. He was started on Keppra and LP done c/w with cryptococcal meningitis. CSF gram stain with yeast.  He was started on  Vanc/Ceftriaxone and amphotericin and flucytosine. EEG with evidence of PLEDs.  On 5/28, patient was noted to be posturing with evidence of right fixed pupil and was started on mannitol.  CT head showed negative and EVD placed at bedside by Dr. Conchita Paris due to concerns of herniation syndrome/severe intracranial hypertension. Blood cultures positive for strep pneumo and Doxycycline added for bordetella PNA.  CSF positive for strep epidermis and Linzeloid added on 6/6. He tolerated extubation 6/2 and mentation slowly improving. EVD clamped and removed. ID recommends Linezolid for 14 days and to continue Ampho/flucytosin with repeat LP next week. Therapy evaluations done today revealing significant weakness with deficits in mobility and ability to carry out ADL tasks. CIR recommended for follow up therapy.   Review of Systems  Unable to perform ROS: Mental acuity   Past Medical History:  Diagnosis Date  . Anxiety   . Bipolar 1 disorder (HCC)   . Depression   . HIV infection (HCC)   . Neuromuscular disorder (HCC)   . Scabies   . 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 Substance abuse   . Syphilis     Past Surgical History:  Procedure Laterality Date  . EXAMINATION UNDER ANESTHESIA N/A 12/28/2014   Procedure: EXAM UNDER ANESTHESIA;  Surgeon: Karie Soda, MD;  Location: [facility];  Service: General;  Laterality: N/A;  . FLOOR OF MOUTH BIOPSY N/A 09/16/2016   Procedure: BIOPSY OF ORAL ABCESS;  Surgeon: Newman Pies, MD;  Location: [facility];  Service: ENT;  Laterality: N/A;  . INCISION AND DRAINAGE ABSCESS N/A 09/16/2016   Procedure: INCISION AND DRAINAGE ABSCESS;  Surgeon: Newman Pies, MD;  Location: [facility];  Service: ENT;  Laterality: N/A;  . INCISION AND DRAINAGE PERIRECTAL ABSCESS  08/27/2010   Dr Carolynne Edouard  . INCISION AND DRAINAGE PERIRECTAL ABSCESS N/A 12/28/2014   Procedure: IRRIGATION AND DEBRIDEMENT PERIRECTAL ABSCESS;  Surgeon: Karie Soda, MD;  Location: [facility];  Service: General;  Laterality: N/A;  Fistula repair and ablation    Family History  Problem Relation Age of Onset  . Hypertension Other      Social History:  Lives with grandmother? Per reports that he has been smoking Cigarettes.  He has a 1.25 pack-year smoking history. He has never used smokeless tobacco. Per reports that he drinks alcohol. Per reports that he does not use drugs.    Allergies: No Known Allergies    Medications Prior to Admission  Medication Sig Dispense Refill  . dolutegravir (TIVICAY) 50 MG tablet Take 1 tablet (50 mg total) by mouth daily. 30 tablet 6  . emtricitabine-rilpivir-tenofovir AF (ODEFSEY) 200-25-25 MG TABS tablet Take 1 tablet by mouth daily with breakfast. 30 tablet 6  . naproxen sodium (ANAPROX) 220 MG tablet Take 220 mg by mouth 2 (two) times daily as needed (pain).    Marland Kitchen sulfamethoxazole-trimethoprim (BACTRIM) 400-80 MG tablet Take 1 tablet by mouth daily. 30 tablet 11    Home: Home Living Family/patient expects to be  discharged to:: Inpatient rehab Living Arrangements: Other relatives  Functional History: Prior Function Level of Independence: Independent Functional Status:  Mobility: Bed Mobility Overal bed mobility: Needs Assistance Bed Mobility: Supine to Sit Supine to sit: Total assist, +2 for physical assistance General bed mobility comments: multimodal cues for initiation an follow through via L side, pt assisting minimally with R  UE Transfers Overall transfer level: Needs assistance Equipment used: 2 person hand held assist Transfers: Sit to/from Stand, Stand Pivot Transfers Sit to Stand: Max assist, +2 physical assistance Stand pivot transfers: Max assist, +2 physical assistance General transfer comment: pt listing heavily Left, more weight on L LE, pt not assisting in pivot with stepping. Ambulation/Gait General Gait Details: not tested.    ADL: ADL General ADL Comments: total A  Cognition: Cognition Overall Cognitive Status: Impaired/Different from baseline Orientation Level: Oriented to person, Oriented to place, Disoriented to time, Disoriented to situation Cognition Arousal/Alertness: Lethargic (but eyes were open) Behavior During Therapy: Restless, Flat affect Overall Cognitive Status: Impaired/Different from baseline Area of Impairment: Following commands, Safety/judgement, Problem solving Orientation Level: Place, Time, Situation Current Attention Level: Focused Following Commands: Follows one step commands inconsistently, Follows one step commands with increased time Safety/Judgement: Decreased awareness of safety, Decreased awareness of deficits Awareness: Intellectual Problem Solving: Slow processing, Decreased initiation, Difficulty sequencing, Requires verbal cues, Requires tactile cues General Comments: Pt was able to follow commands of leaning in different directions with his trunk--but he could not grade his movements; he would also turn his head/eyes left and right with increased time.  Blood pressure 127/72, pulse 94, temperature 98.1 F (36.7 C), temperature source Oral, resp. rate 16, height 6\' 1"  (1.854 m), weight 64.3 kg (141 lb 12.1 oz), SpO2 98 %. Physical Exam  Nursing note and vitals reviewed. Constitutional: He appears well-developed and well-nourished.  Thin male with cortack and bilateral mittens. Restless at times.   HENT:  Head: Normocephalic.  +NG  Eyes: Conjunctivae  are normal. Pupils are equal, round, and reactive to light.  Neck: Normal range of motion. Neck supple.  Cardiovascular: Normal rate and regular rhythm.   Tachycardia with minimal activity.   Respiratory: Effort normal and breath sounds normal. No stridor. No respiratory distress.  GI: Soft. Bowel sounds are normal. He exhibits no distension. There is no tenderness.  Musculoskeletal: He exhibits no edema or tenderness.  Neurological: He is alert.  Encephalopathic.  Nonverbal Not following commands  Skin: Skin is warm and dry.  Multiple lesions BUE/BLE Prior EVD site with staple and underlying edema  Psychiatric: His affect is blunt and inappropriate. His speech is delayed. He is slowed. Cognition and memory are impaired. He expresses impulsivity. He is noncommunicative. He is inattentive.    Results for orders placed or performed during the hospital encounter of 05/20/17 (from the past 24 hour(s))  Glucose, capillary     Status: Abnormal   Collection Time: 05/31/17  8:08 PM  Result Value Ref Range   Glucose-Capillary 159 (H) 65 - 99 mg/dL  Glucose, capillary     Status: Abnormal   Collection Time: 05/31/17 11:55 PM  Result Value Ref Range   Glucose-Capillary 116 (H) 65 - 99 mg/dL  Magnesium     Status: Abnormal   Collection Time: 06/01/17  3:16 AM  Result Value Ref Range   Magnesium 1.3 (L) 1.7 - 2.4 mg/dL  CBC with Differential/Platelet     Status: Abnormal   Collection Time: 06/01/17  3:16 AM  Result Value Ref Range   WBC  1.8 (L) 4.0 - 10.5 K/uL   RBC 3.54 (L) 4.22 - 5.81 MIL/uL   Hemoglobin 10.2 (L) 13.0 - 17.0 g/dL   HCT 40.9 (L) 81.1 - 91.4 %   MCV 90.7 78.0 - 100.0 fL   MCH 28.8 26.0 - 34.0 pg   MCHC 31.8 30.0 - 36.0 g/dL   RDW 78.2 95.6 - 21.3 %   Platelets 327 150 - 400 K/uL   Neutrophils Relative % 71 %   Neutro Abs 1.3 (L) 1.7 - 7.7 K/uL   Lymphocytes Relative 15 %   Lymphs Abs 0.3 (L) 0.7 - 4.0 K/uL   Monocytes Relative 11 %   Monocytes Absolute 0.2 0.1 - 1.0  K/uL   Eosinophils Relative 2 %   Eosinophils Absolute 0.0 0.0 - 0.7 K/uL   Basophils Relative 1 %   Basophils Absolute 0.0 0.0 - 0.1 K/uL  Renal function panel     Status: Abnormal   Collection Time: 06/01/17  3:16 AM  Result Value Ref Range   Sodium 131 (L) 135 - 145 mmol/L   Potassium 4.0 3.5 - 5.1 mmol/L   Chloride 106 101 - 111 mmol/L   CO2 20 (L) 22 - 32 mmol/L   Glucose, Bld 104 (H) 65 - 99 mg/dL   BUN 14 6 - 20 mg/dL   Creatinine, Ser 0.86 (L) 0.61 - 1.24 mg/dL   Calcium 8.5 (L) 8.9 - 10.3 mg/dL   Phosphorus 5.1 (H) 2.5 - 4.6 mg/dL   Albumin 2.2 (L) 3.5 - 5.0 g/dL   GFR calc non Af Amer >60 >60 mL/min   GFR calc Af Amer >60 >60 mL/min   Anion gap 5 5 - 15  Glucose, capillary     Status: None   Collection Time: 06/01/17  3:46 AM  Result Value Ref Range   Glucose-Capillary 92 65 - 99 mg/dL  Glucose, capillary     Status: Abnormal   Collection Time: 06/01/17  8:11 AM  Result Value Ref Range   Glucose-Capillary 105 (H) 65 - 99 mg/dL  Glucose, capillary     Status: Abnormal   Collection Time: 06/01/17 11:40 AM  Result Value Ref Range   Glucose-Capillary 100 (H) 65 - 99 mg/dL  Glucose, capillary     Status: None   Collection Time: 06/01/17  4:11 PM  Result Value Ref Range   Glucose-Capillary 95 65 - 99 mg/dL   No results found.  Assessment/Plan: Diagnosis: Cryptococcal meningitis (meds per ID) Labs independently reviewed.  Records reviewed and summated above.  1. Does the need for close, 24 hr/day medical supervision in concert with the patient's rehab needs make it unreasonable for this patient to be served in a less intensive setting? Yes 2. Co-Morbidities requiring supervision/potential complications: advanced HIV (meds per ID), syphilis (meds per ID), bipolar disorder (consider meds if necessary), seizures (cont meds), tobacco abuse (counsel when appropriate), tachypnea (monitor RR and O2 Sats with increased physical exertion) 3. Due to bladder management, bowel  management, safety, skin/wound care, disease management, medication administration, pain management and patient education, does the patient require 24 hr/day rehab nursing? Yes 4. Does the patient require coordinated care of a physician, rehab nurse, PT (1-2 hrs/day, 5 days/week), OT (1-2 hrs/day, 5 days/week) and SLP (1-2 hrs/day, 5 days/week) to address physical and functional deficits in the context of the above medical diagnosis(es)? Yes Addressing deficits in the following areas: balance, endurance, locomotion, strength, transferring, bowel/bladder control, bathing, dressing, feeding, grooming, toileting, cognition, speech, language, swallowing and  psychosocial support 5. Can the patient actively participate in an intensive therapy program of at least 3 hrs of therapy per day at least 5 days per week? Potentially 6. The potential for patient to make measurable gains while on inpatient rehab is good 7. Anticipated functional outcomes upon discharge from inpatient rehab are min assist  with PT, min assist with OT, min assist with SLP. 8. Estimated rehab length of stay to reach the above functional goals is: 20-25 days. 9. Anticipated D/C setting: Home 10. Anticipated post D/C treatments: HH therapy and Home excercise program 11. Overall Rehab/Functional Prognosis: good  RECOMMENDATIONS: This patient's condition is appropriate for continued rehabilitative care in the following setting: CIR to decrease burden of care once medically stable and able to tolerate 3 hours therapy/day. Patient has agreed to participate in recommended program. Potentially Note that insurance prior authorization may be required for reimbursement for recommended care.  Comment: Rehab Admissions Coordinator to follow up.  Maryla Morrow, MD, [place] Dom 06/02/17 Jacquelynn Cree, PA-C 06/01/2017

## 2017-06-01 NOTE — Progress Notes (Signed)
Nutrition Follow-up  DOCUMENTATION CODES:   Non-severe (moderate) malnutrition in context of chronic illness  INTERVENTION:   Continue Vital AF 1.2 @ 75 ml/hr via Cortrak tube Provides: 2160 kcal, 135 grams protein, and 1459 ml free water   NUTRITION DIAGNOSIS:   Malnutrition (Moderate) related to chronic illness (AIDS) as evidenced by mild depletion of muscle mass, mild depletion of body fat. Ongoing.   GOAL:   Patient will meet greater than or equal to 90% of their needs Progressing.   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  Pt discussed during ICU rounds and with RN.   Labs reviewed: PO4 5.1 (H) Weight near admission weight Cortrak with Vital AF 1.2 @ 75 ml/hr  6/7 Diet advanced, EVD clamped possible d/c next date  Pt looked at me when asking questions but did not respond.   Diet Order:  DIET DYS 2 Room service appropriate? Yes; Fluid consistency: Thin  Skin:  Reviewed, no issues  Last BM:  6/7  Height:   Ht Readings from Last 1 Encounters:  05/20/17 6\' 1"  (1.854 m)    Weight:   Wt Readings from Last 1 Encounters:  06/01/17 141 lb 12.1 oz (64.3 kg)    Ideal Body Weight:  83.6 kg  BMI:  Body mass index is 18.7 kg/m.  Estimated Nutritional Needs:   Kcal:  2100-2300  Protein:  100-125 grams  Fluid:  >/= 2.1 L/day  EDUCATION NEEDS:   No education needs identified at this time  Kendell BaneHeather Zamarian Scarano RD, LDN, CNSC [phone] Pager [phone] After Hours Pager

## 2017-06-01 NOTE — Progress Notes (Signed)
Rehab Admissions Coordinator Note:  Patient was screened by Trish MageLogue, Kardell Virgil M for appropriateness for an Inpatient Acute Rehab Consult.  At this time, we are recommending Inpatient Rehab consult.  Trish MageLogue, Camren Lipsett M 06/01/2017, 12:42 PM  I can be reached at [phone].

## 2017-06-01 NOTE — Evaluation (Signed)
Occupational Therapy Evaluation Patient Details Name: Justin Ball MRN: [id number] DOB: 03/10/1991 Today's Date: 06/01/2017    History of Present Illness Pt is a 26 year old man admitted s/p seizure activity having several days of recent headaches. 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 EVD was placed after acute decompensation on 5/28 with decorticate posturing and asymmetric pupils. 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 PMH includes: syphilis, substance abuse, soft palate ulceration, scabies, neuromuscular disorder, HIV/AIDS, depression, bipolar disorder, anxiety   Clinical Impression   This 26 yo male admitted with above presents to acute OT with deficits below (see OT problem list) thus affecting his PLOF of being totally independent with basic ADLs and IADLs. He will benefit from acute OT with follow up OT on CIR to work back towards an independent level.    Follow Up Recommendations  CIR;Supervision/Assistance - 24 hour    Equipment Recommendations  Other (comment) (TBD at next venue)    Recommendations for Other Services Rehab consult     Precautions / Restrictions Precautions Precautions: Fall Restrictions Weight Bearing Restrictions: No      Mobility Bed Mobility Overal bed mobility: Needs Assistance Bed Mobility: Supine to Sit     Supine to sit: Total assist;+2 for physical assistance     General bed mobility comments: multimodal cues for initiation an follow through via L side, pt assisting minimally with R UE  Transfers Overall transfer level: Needs assistance Equipment used: 2 person hand held assist Transfers: Sit to/from Stand;Stand Pivot Transfers Sit to Stand: Max assist;+2 physical assistance Stand pivot transfers: Max assist;+2 physical assistance       General transfer comment: pt listing heavily Left, more weight on L LE, pt not assisting in pivot with stepping.     Balance Overall balance assessment: Needs assistance Sitting-balance support: Single extremity supported;Bilateral upper extremity supported;Feet supported Sitting balance-Leahy Scale: Poor Sitting balance - Comments: sat EOB x 15 min working on trunk control with improvements in activation, but pt unable to controll full in any direction.     Standing balance-Leahy Scale: Zero Standing balance comment: pt unable to control trunk and needed max/total assist for upright posture in standing.                           ADL either performed or assessed with clinical judgement   ADL                                         General ADL Comments: total A     Vision   Vision Assessment?: Vision impaired- to be further tested in functional context Additional Comments: Pt can move his head and eyes left and right with increased time            Pertinent Vitals/Pain Pain Assessment: Faces Faces Pain Scale: No hurt     Hand Dominance  (unknown-- pt unable to tell us)   Extremity/Trunk Assessment Upper Extremity Assessment Upper Extremity Assessment: RUE deficits/detail;LUE deficits/detail RUE Deficits / Details: moves this arm spontaneously to lay in the bed to his right as he was sitting EOB (but did not use it to support self) RUE Coordination: decreased fine motor;decreased gross motor LUE Deficits / Details: No spontaneous movement noted, but he did attempt to use it to pull himself forward from posterior lean in sitting (  I had ahold of his arms and he "pulled on me" minimally to come back foreward) LUE Coordination: decreased fine motor;decreased gross motor   Lower Extremity Assessment Lower Extremity Assessment: Generalized weakness;RLE deficits/detail;LLE deficits/detail RLE Deficits / Details: unable to assess formally due to arousal/mentation.  pt mimicked movement that suggest general weakness bil RLE Coordination: decreased fine motor LLE  Deficits / Details: not formally tested, spontaneous and on command movement suggests general weakness.  Pt bore some weight in standing, L >R LE LLE Coordination: decreased fine motor       Communication Communication Communication:  (pt attempted to speak x1 but low voice so could not understand)   ognition Arousal/Alertness: Lethargic (but eyes were open) Behavior During Therapy: Restless;Flat affect Overall Cognitive Status: Impaired/Different from baseline Area of Impairment: Following commands;Safety/judgement;Problem solving                 Orientation Level: Place;Time;Situation Current Attention Level: Focused   Following Commands: Follows one step commands inconsistently;Follows one step commands with increased time Safety/Judgement: Decreased awareness of safety;Decreased awareness of deficits Awareness: Intellectual Problem Solving: Slow processing;Decreased initiation;Difficulty sequencing;Requires verbal cues;Requires tactile cues General Comments: Pt was able to follow commands of leaning in different directions with his trunk--but he could not grade his movements; he would also turn his head/eyes left and right with increased time.   General Comments  HR/EHR up as high as 140's sustained, but generally in the 120's.  SpO2 in upper 90's            Home Living Family/patient expects to be discharged to:: Inpatient rehab Living Arrangements: Other relatives                                      Prior Functioning/Environment Level of Independence: Independent                 OT Problem List: Decreased strength;Decreased range of motion;Decreased activity tolerance;Impaired balance (sitting and/or standing);Impaired vision/perception;Decreased coordination;Decreased cognition;Decreased safety awareness;Decreased knowledge of use of DME or AE;Impaired tone;Impaired UE functional use      OT Treatment/Interventions: Self-care/ADL  training;Therapeutic activities;Therapeutic exercise;Cognitive remediation/compensation;Visual/perceptual remediation/compensation;DME and/or AE instruction;Patient/family education;Balance training;Neuromuscular education    OT Goals(Current goals can be found in the care plan section) Acute Rehab OT Goals Patient Stated Goal: pt unable OT Goal Formulation: Patient unable to participate in goal setting Time For Goal Achievement: 06/15/17 Potential to Achieve Goals: Good  OT Frequency: Min 3X/week   Barriers to D/C:  (unknown)          Co-evaluation PT/OT/SLP Co-Evaluation/Treatment: Yes Reason for Co-Treatment: Complexity of the patient's impairments (multi-system involvement);For patient/therapist safety;Necessary to address cognition/behavior during functional activity   OT goals addressed during session: Strengthening/ROM      AM-PAC PT "6 Clicks" Daily Activity     Outcome Measure Help from another person eating meals?: Total Help from another person taking care of personal grooming?: Total Help from another person toileting, which includes using toliet, bedpan, or urinal?: Total Help from another person bathing (including washing, rinsing, drying)?: Total Help from another person to put on and taking off regular upper body clothing?: Total Help from another person to put on and taking off regular lower body clothing?: Total 6 Click Score: 6   End of Session Nurse Communication: Mobility status  Activity Tolerance: Patient limited by lethargy Patient left: in chair;with chair alarm set;with nursing/sitter in room  OT  Visit Diagnosis: Unsteadiness on feet (R26.81);Other abnormalities of gait and mobility (R26.89);Muscle weakness (generalized) (M62.81);Feeding difficulties (R63.3);Other symptoms and signs involving the nervous system (R29.898);Other symptoms and signs involving cognitive function;Cognitive communication deficit (R41.841)                Time: [id number] OT  Time Calculation (min): 32 min Charges:  OT General Charges $OT Visit: 1 Procedure OT Evaluation $OT Eval High Complexity: 1 Procedure Ignacia PalmaCathy Aseel Uhde, OTR/L [phone] 06/01/2017

## 2017-06-01 NOTE — Progress Notes (Signed)
SLP Cancellation Note  Patient Details Name: Justin Ball MRN: [id number] DOB: 03/06/1991   Cancelled treatment:       Reason Eval/Treat Not Completed: Other (comment) Attempted to see pt this afternoon but he is being transferred to another floor. I did touch base with his grandmother this morning who said that he ate all of his breakfast tray without overt difficulty. Will continue to follow as able.   Maxcine Hamaiewonsky, Kristain Hu 06/01/2017, 4:05 PM  Maxcine HamLaura Paiewonsky, M.A. CCC-SLP [phone]

## 2017-06-01 NOTE — Care Management Note (Signed)
Case Management Note Original Note Created by Sidney AceJulie Amerson  Patient Details  Name: Justin Ball MRN: [id number] Date of Birth: 07/10/1991  Subjective/Objective:   Pt admitted on 05/20/17 with seizures, AMS, cryptococcal meningitis.  PTA, pt independent of ADLS.                    Action/Plan: Will follow for discharge planning as pt progresses.  Pt currently remains intubated.    Expected Discharge Date:                  Expected Discharge Plan:     In-House Referral:  Chaplain  Discharge planning Services  CM Consult  Post Acute Care Choice:    Choice offered to:     DME Arranged:    DME Agency:     HH Arranged:    HH Agency:     Status of Service:  In process, will continue to follow  If discussed at Long Length of Stay Meetings, dates discussed:    Additional Comments: 06/01/2017 Pt is extubated, has a diet but still on tube feeds.  Plan is to repeat cultures next week, continue with IV antibiotics.  CIR recommended CIR consult- CM asked via physician sticky tab

## 2017-06-01 NOTE — Progress Notes (Signed)
[facility]   Reason for visit: Follow up on HIV, cryptococcal meningitis  Interval History: afebrile, mother and gm at bedside, some loose stools in the setting of tube feeds; no associated rash  Physical Exam: Constitutional:  Vitals:   06/01/17 1100 06/01/17 1159  BP: (!) 143/72   Pulse: (!) 124   Resp: 16   Temp:  97.9 F (36.6 C)   patient appears in NAD, in chair, eyes open HENT: no thrush Respiratory: Normal respiratory effort; CTA B Cardiovascular: RRR Neuro: no response  Review of Systems: Unable to be assessed due to mental status  Lab Results  Component Value Date   WBC 1.8 (L) 06/01/2017   HGB 10.2 (L) 06/01/2017   HCT 32.1 (L) 06/01/2017   MCV 90.7 06/01/2017   PLT 327 06/01/2017    Lab Results  Component Value Date   CREATININE 0.54 (L) 06/01/2017   BUN 14 06/01/2017   NA 131 (L) 06/01/2017   K 4.0 06/01/2017   CL 106 06/01/2017   CO2 20 (L) 06/01/2017    Lab Results  Component Value Date   ALT 95 (H) 05/31/2017   AST 93 (H) 05/31/2017   ALKPHOS 223 (H) 05/31/2017     Microbiology: Recent Results (from the past 240 hour(s))  Gastrointestinal Panel by PCR , Stool     Status: Abnormal   Collection Time: 05/24/17 11:52 AM  Result Value Ref Range Status   Campylobacter species DETECTED (A) NOT DETECTED Final    Comment: RESULT CALLED TO, READ BACK BY AND VERIFIED WITH: [name] CARMICHAEL AT 2005 ON 05/25/17 RWW    Plesimonas shigelloides NOT DETECTED NOT DETECTED Final   Salmonella species NOT DETECTED NOT DETECTED Final   Yersinia enterocolitica NOT DETECTED NOT DETECTED Final   Vibrio species NOT DETECTED NOT DETECTED Final   Vibrio cholerae NOT DETECTED NOT DETECTED Final   Enteroaggregative E coli (EAEC) NOT DETECTED NOT DETECTED Final   Enterotoxigenic E coli (ETEC) NOT DETECTED NOT DETECTED Final   Shiga like toxin producing E coli (STEC) DETECTED (A) NOT DETECTED Final    Comment: RESULT CALLED TO, READ  BACK BY AND VERIFIED WITH: [name] CARMICHAEL AT 2005 ON 05/25/17 RWW    E. coli O157 NOT DETECTED NOT DETECTED Final   Shigella/Enteroinvasive E coli (EIEC) NOT DETECTED NOT DETECTED Final   Cryptosporidium NOT DETECTED NOT DETECTED Final   Cyclospora cayetanensis NOT DETECTED NOT DETECTED Final   Entamoeba histolytica NOT DETECTED NOT DETECTED Final   Giardia lamblia DETECTED (A) NOT DETECTED Final   Adenovirus F40/41 NOT DETECTED NOT DETECTED Final   Astrovirus NOT DETECTED NOT DETECTED Final   Norovirus GI/GII NOT DETECTED NOT DETECTED Final   Rotavirus A NOT DETECTED NOT DETECTED Final   Sapovirus (I, II, IV, and V) DETECTED (A) NOT DETECTED Final  CSF culture with Stat gram stain     Status: None (Preliminary result)   Collection Time: 05/29/17 12:19 PM  Result Value Ref Range Status   Specimen Description CSF  Final   Special Requests NONE  Final   Gram Stain   Final    NO WBC SEEN YEAST CORRECTED RESULTS PREVIOUSLY REPORTED AS: NO ORGANISMS SEEN CORRECTED RESULTS CALLED TO: M HARPER,RN AT 1525 05/29/17 BY L BENFIELD    Culture   Final    FEW STAPHYLOCOCCUS EPIDERMIDIS CRITICAL RESULT CALLED TO, READ BACK BY AND VERIFIED WITH: DR Drue Second 05/30/17 @ 1119 M VESTAL CULTURE REINCUBATED FOR BETTER GROWTH  Report Status PENDING  Incomplete   Organism ID, Bacteria STAPHYLOCOCCUS EPIDERMIDIS  Final      Susceptibility   Staphylococcus epidermidis - MIC*    CIPROFLOXACIN <=0.5 SENSITIVE Sensitive     ERYTHROMYCIN >=8 RESISTANT Resistant     GENTAMICIN <=0.5 SENSITIVE Sensitive     OXACILLIN >=4 RESISTANT Resistant     TETRACYCLINE 2 SENSITIVE Sensitive     VANCOMYCIN 2 SENSITIVE Sensitive     TRIMETH/SULFA >=320 RESISTANT Resistant     CLINDAMYCIN >=8 RESISTANT Resistant     RIFAMPIN <=0.5 SENSITIVE Sensitive     Inducible Clindamycin NEGATIVE Sensitive     * FEW STAPHYLOCOCCUS EPIDERMIDIS    Impression/Plan:  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  Recent CSF with yeast on gram stain.   Will need a repeat sample at some point next week  2.  Staph epi meningitis - culture positive.  LInezolid sensitive.  Will need 14 days total  3.  Leukopenia - I most suspect this is related to flucytosine.  With the critical nature of the illness, I want to continue with induction therapy regardless.  Will need to continue to monitor.    4.  Loose stools - Not c/w C diff.  Other etiologies possible but likely related to illness, tube feeds.  OK from ID standpoint to use anti-diarrheal medication  5.  Hepatitis B - he has remained off of treatment due to being off [employer]Vs for #1 and can't restart at this time.    6.  Bordetella - will stop doxy  7. STrep bacteremia - linezollid as above  Dr. Ninetta LightsHatcher on over the weekend and will follow up if needed. Otherwise I will follow up on Monday

## 2017-06-02 ENCOUNTER — Inpatient Hospital Stay (HOSPITAL_COMMUNITY): Payer: No Typology Code available for payment source

## 2017-06-02 DIAGNOSIS — Z720 Tobacco use: Secondary | ICD-10-CM

## 2017-06-02 DIAGNOSIS — F3170 Bipolar disorder, currently in remission, most recent episode unspecified: Secondary | ICD-10-CM

## 2017-06-02 DIAGNOSIS — R0682 Tachypnea, not elsewhere classified: Secondary | ICD-10-CM

## 2017-06-02 DIAGNOSIS — I6789 Other cerebrovascular disease: Secondary | ICD-10-CM

## 2017-06-02 DIAGNOSIS — Z8619 Personal history of other infectious and parasitic diseases: Secondary | ICD-10-CM

## 2017-06-02 DIAGNOSIS — Z978 Presence of other specified devices: Secondary | ICD-10-CM

## 2017-06-02 HISTORY — PX: TRANSTHORACIC ECHOCARDIOGRAM: SHX275

## 2017-06-02 LAB — CBC WITH DIFFERENTIAL/PLATELET
BASOS ABS: 0 10*3/uL (ref 0.0–0.1)
BASOS PCT: 1 %
EOS ABS: 0.1 10*3/uL (ref 0.0–0.7)
EOS PCT: 3 %
HEMATOCRIT: 30.3 % — AB (ref 39.0–52.0)
Hemoglobin: 9.9 g/dL — ABNORMAL LOW (ref 13.0–17.0)
Lymphocytes Relative: 18 %
Lymphs Abs: 0.5 10*3/uL — ABNORMAL LOW (ref 0.7–4.0)
MCH: 29.3 pg (ref 26.0–34.0)
MCHC: 32.7 g/dL (ref 30.0–36.0)
MCV: 89.6 fL (ref 78.0–100.0)
MONO ABS: 0.3 10*3/uL (ref 0.1–1.0)
MONOS PCT: 11 %
Neutro Abs: 1.8 10*3/uL (ref 1.7–7.7)
Neutrophils Relative %: 67 %
PLATELETS: 368 10*3/uL (ref 150–400)
RBC: 3.38 MIL/uL — ABNORMAL LOW (ref 4.22–5.81)
RDW: 13.5 % (ref 11.5–15.5)
WBC: 2.7 10*3/uL — ABNORMAL LOW (ref 4.0–10.5)

## 2017-06-02 LAB — GLUCOSE, CAPILLARY
GLUCOSE-CAPILLARY: 90 mg/dL (ref 65–99)
GLUCOSE-CAPILLARY: 104 mg/dL — AB (ref 65–99)
GLUCOSE-CAPILLARY: 101 mg/dL — AB (ref 65–99)
Glucose-Capillary: 69 mg/dL (ref 65–99)
Glucose-Capillary: 156 mg/dL — ABNORMAL HIGH (ref 65–99)
Glucose-Capillary: 81 mg/dL (ref 65–99)
Glucose-Capillary: 102 mg/dL — ABNORMAL HIGH (ref 65–99)

## 2017-06-02 LAB — RENAL FUNCTION PANEL
ALBUMIN: 2.3 g/dL — AB (ref 3.5–5.0)
Anion gap: 5 (ref 5–15)
BUN: 10 mg/dL (ref 6–20)
CO2: 21 mmol/L — ABNORMAL LOW (ref 22–32)
CREATININE: 0.56 mg/dL — AB (ref 0.61–1.24)
Calcium: 8.7 mg/dL — ABNORMAL LOW (ref 8.9–10.3)
Chloride: 106 mmol/L (ref 101–111)
GFR calc Af Amer: >60 mL/min (ref >60)
GLUCOSE: 136 mg/dL — AB (ref 65–99)
PHOSPHORUS: 4.3 mg/dL (ref 2.5–4.6)
POTASSIUM: 4.1 mmol/L (ref 3.5–5.1)
Sodium: 132 mmol/L — ABNORMAL LOW (ref 135–145)

## 2017-06-02 LAB — ECHOCARDIOGRAM COMPLETE
HEIGHTINCHES: 73 in
WEIGHTICAEL: 2366.86 [oz_av]

## 2017-06-02 LAB — MAGNESIUM: MAGNESIUM: 1.4 mg/dL — AB (ref 1.7–2.4)

## 2017-06-02 MED ORDER — DEXTROSE 50 % IV SOLN
INTRAVENOUS | Status: AC
  Administered 2017-06-02: 25 mL
  Filled 2017-06-02: qty 50

## 2017-06-02 MED ORDER — MAGNESIUM SULFATE 4 GM/100ML IV SOLN
4.0000 g | Freq: Once | INTRAVENOUS | Status: AC
  Administered 2017-06-02: 4 g via INTRAVENOUS
  Filled 2017-06-02: qty 100

## 2017-06-02 NOTE — Progress Notes (Signed)
  Speech Language Pathology Treatment: Dysphagia  Patient Details Name: Justin SpurrLinston R Ball MRN: [id number] DOB: 10/30/1991 Today's Date: 06/02/2017 Time: [id number] SLP Time Calculation (min) (ACUTE ONLY): 20 min  Assessment / Plan / Recommendation Clinical Impression  F/u diet tolerance assessment complete. Patient easily aroused from sleep, becoming immediately restless but calmed with repositioning to maximize comfort and safety with po intake. With max assist for feeding, patient able to consume currently prescribed diet with no overt s/s of aspiration, delayed munch-like mastication of dysphagia 3 and dysphagia 2 solids but full oral clearance. Overall, current diet remains appropriate with continuation of dysphagia 2 solids to maximize amount of po intake. Will continue to require full assist with feeding.    HPI HPI: Pt is a 26 year old man admitted s/p seizure activity having several days of recent headaches. 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 EVD was placed after acute decompensation on 5/28 with decorticate posturing and asymmetric pupils. 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 PMH includes: syphilis, substance abuse, soft palate ulceration, scabies, neuromuscular disorder, HIV/AIDS, depression, bipolar disorder, anxiety      SLP Plan  Continue with current plan of care       Recommendations  Diet recommendations: Dysphagia 2 (fine chop);Thin liquid Liquids provided via: Cup;Straw Medication Administration: Whole meds with puree Supervision: Staff to assist with self feeding;Full supervision/cueing for compensatory strategies Compensations: Minimize environmental distractions;Slow rate;Small sips/bites Postural Changes and/or Swallow Maneuvers: Seated upright 90 degrees                Oral Care Recommendations: Oral care BID Follow up Recommendations: Inpatient Rehab SLP Visit Diagnosis:  Dysphagia, unspecified (R13.10) Plan: Continue with current plan of care       GO             Justin LangoLeah Jaston Havens MA, CCC-SLP [phone]    Justin Ball 06/02/2017, 8:52 AM

## 2017-06-02 NOTE — Progress Notes (Signed)
Neurology Progress Note  Subjective: Ventric removed and ransferred to stepdown yesterday afternoon. Per notes, had some intermittent restlessness overnight for which he was given Ativan. His speech was noted to become more clear over the course of the night. SLP saw him this morning, continue to recommend dysphagia 2 diet. There was some report of CSF leaking from his ventric incision but none was seen by neurosurgery. The patient is currently sleeping and rouses briefly to voice before going right back to sleep. He did not participate with the exam for me.   Medications reviewed and reconciled.   Pertinent meds: Amphotericin B 250 mg every 24 hours Azithromycin 1200 mg weekly Diphenhydramine 25 mg every 24 hours Flucytosine 1500 mg every 6 hours Keppra 1000 mg every 12 hours Bactrim 10 mL daily  Current Meds:   Current Facility-Administered Medications:  .  0.9 %  sodium chloride infusion, 250 mL, Intravenous, PRN, Wilber Oliphant, MD, Last Rate: 10 mL/hr at 06/01/17 0700, 250 mL at 06/01/17 0700 .  acetaminophen (TYLENOL) tablet 650 mg, 650 mg, Oral, Q4H PRN, Wilber Oliphant, MD .  acetaminophen (TYLENOL) tablet 650 mg, 650 mg, Oral, Q24H, [name], Lisette Grinder, MD, 650 mg at 06/01/17 1717 .  albuterol (PROVENTIL) (2.5 MG/3ML) 0.083% nebulizer solution 2.5 mg, 2.5 mg, Nebulization, Q2H PRN, Wilber Oliphant, MD .  amphotericin B liposome (AMBISOME) 250 mg in dextrose 5 % 500 mL IVPB, 4 mg/kg, Intravenous, Q24H, Shaune Pollack, MD, Stopped at 06/01/17 2038 .  azithromycin (ZITHROMAX) 200 MG/5ML suspension 1,200 mg, 1,200 mg, Per Tube, Weekly, Judyann Munson, MD, 1,200 mg at 05/31/17 0942 .  chlorhexidine (PERIDEX) 0.12 % solution 15 mL, 15 mL, Mouth Rinse, BID, Roslynn Amble, MD, 15 mL at 06/02/17 0919 .  Chlorhexidine Gluconate Cloth 2 % PADS 6 each, 6 each, Topical, Daily, Leslye Peer, MD, 6 each at 06/02/17 [phone] .  dextrose 5 %-0.45 % sodium chloride infusion, , Intravenous,  Continuous, Rumbarger, Faye Ramsay, RPH, Last Rate: 50 mL/hr at 06/02/17 1100 .  diphenhydrAMINE (BENADRYL) capsule 25 mg, 25 mg, Oral, Q24H, [name], Lisette Grinder, MD, 25 mg at 06/01/17 1716 .  feeding supplement (VITAL AF 1.2 CAL) liquid 1,000 mL, 1,000 mL, Per Tube, Continuous, Roslynn Amble, MD, Last Rate: 75 mL/hr at 06/02/17 1100, 1,000 mL at 06/02/17 1100 .  flucytosine (ANCOBON) capsule 1,500 mg, 1,500 mg, Per Tube, Q6H, Sood, Vineet, MD, 1,500 mg at 06/02/17 1100 .  insulin aspart (novoLOG) injection 2-6 Units, 2-6 Units, Subcutaneous, Q4H, Wilber Oliphant, MD, 4 Units at 05/31/17 2026 .  levETIRAcetam (KEPPRA) 1,000 mg in sodium chloride 0.9 % 100 mL IVPB, 1,000 mg, Intravenous, Q12H, Rejeana Brock, MD, Stopped at 06/02/17 [phone] .  linezolid (ZYVOX) IVPB 600 mg, 600 mg, Intravenous, Q12H, Comer, Belia Heman, MD, Stopped at 06/02/17 1153 .  LORazepam (ATIVAN) injection 1 mg, 1 mg, Intravenous, Q4H PRN, Leslye Peer, MD, 1 mg at 06/02/17 0919 .  MEDLINE mouth rinse, 15 mL, Mouth Rinse, q12n4p, Roslynn Amble, MD, 15 mL at 06/01/17 1240 .  metoprolol tartrate (LOPRESSOR) injection 2.5-5 mg, 2.5-5 mg, Intravenous, Q3H PRN, Kalman Shan, MD, 5 mg at 05/31/17 1222 .  metoprolol tartrate (LOPRESSOR) injection 5 mg, 5 mg, Intravenous, Q8H, Deterding, Dorise Hiss, MD, 5 mg at 06/02/17 0533 .  ondansetron (ZOFRAN) injection 4 mg, 4 mg, Intravenous, Q6H PRN, Panchal, Amar, MD .  potassium chloride 20 MEQ/15ML (10%) solution 40 mEq, 40 mEq, Oral, Daily, Rumbarger, Rachel L, RPH, 40 mEq  at 06/02/17 0919 .  sodium chloride 0.9 % bolus 1,000 mL, 1,000 mL, Intravenous, Q24H, Dixon, Stephanie N, NP, 1,000 mL at 06/01/17 2215 .  sodium chloride 0.9 % bolus 1,000 mL, 1,000 mL, Intravenous, Q24H, Dixon, Stephanie N, NP, 1,000 mL at 06/01/17 1718 .  sodium chloride flush (NS) 0.9 % injection 10-40 mL, 10-40 mL, Intracatheter, PRN, Leslye PeerByrum, Robert S, MD .  sulfamethoxazole-trimethoprim  (BACTRIM,SEPTRA) 200-40 MG/5ML suspension 10 mL, 10 mL, Per Tube, Daily, Sood, Vineet, MD, 10 mL at 06/02/17 0922  Objective:  Temp:  [97.5 F (36.4 C)-98.1 F (36.7 C)] 98 F (36.7 C) (06/09 1148) Pulse Rate:  [80-118] 103 (06/09 0846) Resp:  [12-19] 12 (06/09 0710) BP: (106-141)/(58-81) 107/68 (06/09 0846) SpO2:  [98 %-100 %] 100 % (06/09 0846) Weight:  [67.1 kg (147 lb 14.9 oz)] 67.1 kg (147 lb 14.9 oz) (06/09 0340)  General: WD AA man sleeping soundly in bed. He woke up to voice and briefly fixed and tracked before going right back to sleep. He did not attempt to speak for me and did not follow any commands.   HEENT: Dressing in place over ventric site. Sclerae are anicteric. There is no conjunctival injection.  CV: Regular, tachy. Carotid pulses are 2+ and symmetric with no bruits. Distal pulses 2+ and symmetric.  Lungs: CTAB on anterior auscultation.  Extremities: No C/C/E. Neuro: MS: As noted above.  CN: Pupils are equal reactive from 3-->2 mm bilaterally. Eyes are mildly dysconjugate, no forced deviation and no nystagmus. Corneals are intact. His face appears symmetric and he resists passive eye opening with good strength. The remainder of his cranial nerve exam is limited by poor cooperation.  Motor: Thin bulk diffusely. He has normal tone. He does not participate with confrontational strength testing. No tremor or other abnormal movements are observed.  Sensation: He grimaces and withdraws from nailbed pressure x4.  DTRs: 3+, symmetric. Toes are mute bilaterally.  Coordination/gait:  Unable to assess as he does not participate with the exam.    Labs: Lab Results  Component Value Date   WBC 2.7 (L) 06/02/2017   HGB 9.9 (L) 06/02/2017   HCT 30.3 (L) 06/02/2017   PLT 368 06/02/2017   GLUCOSE 136 (H) 06/02/2017   CHOL 125 09/28/2016   TRIG 150 (H) 05/26/2017   HDL 31 (L) 09/28/2016   LDLCALC 70 09/28/2016   ALT 95 (H) 05/31/2017   AST 93 (H) 05/31/2017   NA 132 (L)  06/02/2017   K 4.1 06/02/2017   CL 106 06/02/2017   CREATININE 0.56 (L) 06/02/2017   BUN 10 06/02/2017   CO2 21 (L) 06/02/2017   INR 1.20 05/20/2017   CBC Latest Ref Rng & Units 06/02/2017 06/01/2017 05/31/2017  WBC 4.0 - 10.5 K/uL 2.7(L) 1.8(L) 2.5(L)  Hemoglobin 13.0 - 17.0 g/dL 4.0(J9.9(L) 10.2(L) 10.1(L)  Hematocrit 39.0 - 52.0 % 30.3(L) 32.1(L) 30.6(L)  Platelets 150 - 400 K/uL 368 327 301    No results found for: HGBA1C Lab Results  Component Value Date   ALT 95 (H) 05/31/2017   AST 93 (H) 05/31/2017   ALKPHOS 223 (H) 05/31/2017   BILITOT 0.7 05/31/2017     Radiology:  There is no new neuroimaging.   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 EVD removed yesterday, has remained stable. Continuing induction therapy with amphotericin and flucytosine; appreciate ID assistance.    2. Acute ischemic infarction: He has numerous scattered areas  of ischemia on MRI, most likely due to crypto. Continue antifungals. There is no role for antiplatelet agents or statins in this context. TTE.   3. Acute encephalopathy: This is likely multifactorial with contributions from cryptococcal meningitis, ischemic infarctions, respiratory failure, medication effect, metabolic derangements (hyponatremia, hepatic dysfunction). Continue with supportive care. Continue to optimize metabolic status as you are. Continue to treat underlying infections as needed. Continue to wean sedation as tolerated, minimizing the use of opiates, benzos, and anything with strong anticholinergic properties.  4. Seizures: No seizure since presentation. His seizures were most likely provoked by his crypto meningitis as he has no reported prior history of seizure. Continue Keppra. Continue seizure precautions.  5. HIV/AIDS: Current CD4 count is 2. Appreciate ID assistance. Antiretroviral therapy is on hold.   No family was present at the time of my visit.   Rhona Leavens, MD [facility]

## 2017-06-02 NOTE — Progress Notes (Signed)
Patient had intermittent periods of restlessness last night, improved with dose of ativan. Speech became clearer through the night, and pt was trying to answer questions at times. Rectal tube had small amount of liquid stool.

## 2017-06-02 NOTE — Progress Notes (Signed)
Patient ID: Justin Ball, male   DOB: 10/30/1991, 26 y.o.   MRN: [id number] Vital signs are stable Patient remains agitated Nurse described CSF leaking from stapled incision This is now stopped The ventriculostomy site appears dry and flat at this time I've advised to dress this with Betadine swab stick and a dry dressing to apply some pressure We'll continue to observe this.

## 2017-06-02 NOTE — Progress Notes (Signed)
Pt blood sugar was 69, 25ml D50 given and blood sugar increased. Will continue to monitor and make Dr Joseph ArtWoods aware.

## 2017-06-02 NOTE — Progress Notes (Signed)
PROGRESS NOTE  Justin Ball  WUJ:[id number] DOB: 11/05/1991 DOA: 05/20/2017 PCP: Randall HissVan Dam, [name], MD   Brief Narrative: Justin Ball is a 26 y.o. male with a history of AIDS, noncompliance, syphilis, bipolar disorder who presented on 5/27 after being pulled from his car unresponsive with concern for new-onset seizures, postictal on arrival. He had been seen in ED 3 days prior with headaches, and discharged afebrile, well-appearing with negative neuroimaging. He was admitted to the ICU, intubated. LP performed demonstrated yeast and he was treated with amphotericin and flucytosine, in addition to vancomycin and zosyn, for cryptococcal meningitis. EEG showed PLEDs, keppra started. The day after admission the patient demonstrated a fixed right pupil and posturing. Mannitol was started, CT head negative, and ventriculostomy was placed by neurosurgery due to concern for herniation. Blood cultures grew positive with S. pneumoniae. CSF culture later grew S. epidermidis and linezolid was added on 6/6. He was extubated 6/2 and mental status is noted to still be altered from baseline. The patient was transferred to SDU and weaned from precedex on 6/8 to the Hospitalists service 6/9. Neurology, neurosurgery, and ID are consulting, and plan is for discharge to CIR once patient is stable and able to tolerate 3 hours of therapy daily.   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  - Continue amphotericin and flucytosine per ID - Continue  linezolid x14 days (6/6 - last dose on 6/19) - Continue keppra per neurology for seizure Tx - Repeat LP next week - EVD removed 6/8. Ventriculostomy site leaking per RN, not seen today. Will monitor, neurosurgery following.   Acute encephalopathy: Multifactorial with meningoencephalitis, seizures, ischemic infarcts (no antiplatelet per neuro), metabolic derangements and medications; likely complicated by delirium. - Treat underlying conditions. - Ativan prn agitation, off precedex since 6/8 - Avoid haldol with seizure d/o - SLP evaluating, giving dysphagia 2 diet in addition to TF's  Streptococcus pneumoniae bacteremia:  - Linezolid per ID x14 days - TTE ordered  Bordatella pneumonia:  - Was treated with doxycycline per ID  AIDS: CD4 count April 2018 was 2.  - Hold ART per ID - Continue bactrim daily and weekly azithromycin (given 6/7) ppx  Electrolyte derangements: Hypomagnesemia noted today - Repleting Mg and K per pharmacy   Leukopenia: Suspected due to flucytosine.  - Monitor  Diarrhea: Acute, likely from tube feeds.  - Flexiseal - Anti-diarrheals ok  Chronic hepatitis B:  - Follow up with ID  DVT prophylaxis: SCDs Code Status: Full Family Communication: None at bedside this AM Disposition Plan: Continue SDU for now. Eventual DC to CIR.   Consultants:   PCCM primary 5/27 - 6/8  Neurology  Neurosurgery  ID  Procedures:  OETT 5/27 - 6/3 OGT 5/27 - 6/3 NGT >>>  Right ventriculostomy 5/28 >>>6/8 LUE TL PICC 6/3 >>> Foley 6/4 >>>  Cultures/Antimicrobials: Blood Cultures x2 5/27: 1 of 2 +Streptococcus pneumoniae  Blood Fungal Culture 5/27:  Negative  CSF Culture 5/27:  Cryptococcus neoformans  Tracheal Aspirate Culture 5/28:  MODERATE BORDETELLA BRONCHISEPTICA  Blood  Cultures x2 5/29:  Negative  Gastrointestinal PCR 5/31:  Campylobacter / E coli (shiga-like toxin producing) / Giardia lamblia / Sapovirus  CSF Culture 6/5: Yeast & few S.  epidermidis  Ambisome 5/27 >>> Flucytosine 5/27 >>> Rocephin 5/27 >>> Bactrim 5/27 >>> Azithromycin qweek 5/31 >>> Doxycycline 6/2 >>> linezolid 6/6>>>  Subjective: Confused, unable to answer questions.   Objective: Vitals:   06/02/17 0400 06/02/17 0710 06/02/17 0846 06/02/17 1148  BP:  (!) 106/58 107/68   Pulse:   (!) 103   Resp:  12    Temp: 97.8 F (36.6 C)  97.5 F (36.4 C) 98 F (36.7 C)  TempSrc: Axillary  Oral Axillary  SpO2:   100%   Weight:      Height:        Intake/Output Summary (Last 24 hours) at 06/02/17 1451 Last data filed at 06/02/17 1100  Gross per 24 hour  Intake          4152.92 ml  Output             5300 ml  Net         -1147.08 ml   Filed Weights   05/31/17 0500 06/01/17 0328 06/02/17 0340  Weight: 60.6 kg (133 lb 9.6 oz) 64.3 kg (141 lb 12.1 oz) 67.1 kg (147 lb 14.9 oz)    Examination: General exam: Thin 26 y.o. male in no distress HEENT: feeding tube in situ Respiratory system: Non-labored breathing. Clear to auscultation bilaterally.  Cardiovascular system: Regular rate and rhythm. No murmur, rub, or gallop. No JVD, and no pedal edema. Gastrointestinal system: Abdomen soft, non-tender, non-distended, with normoactive bowel sounds. No organomegaly or masses felt. Flexiseal in place, draining light brown liquid stool. GU: Foley in situ Neuro: Awoke to voice, frequently falling asleep again. Nonverbal and not cooperative with exam.  Extremities: Warm, no deformities Skin: No rashes, lesions no ulcers Psychiatry: UTD  Data Reviewed: I have personally reviewed following labs and imaging studies  CBC:  Recent Labs Lab 05/29/17 0436 05/30/17 0445 05/31/17 0255 06/01/17 0316 06/02/17 0537  WBC 3.7* 2.5* 2.5* 1.8* 2.7*  NEUTROABS 2.7 1.7 1.8 1.3* 1.8  HGB 10.1* 8.2* 10.1* 10.2* 9.9*  HCT 32.5* 26.3* 30.6* 32.1* 30.3*  MCV 93.1 93.9 89.7 90.7 89.6  PLT 294 215 301 327 368   Basic Metabolic Panel:  Recent Labs Lab 05/29/17 0436  05/30/17 0600 05/30/17 1713 05/31/17 0255 05/31/17 0508 05/31/17 0801 06/01/17 0316 06/02/17 0537  NA 135 131*  --   --  127* 132* 131* 132*  K 4.1 4.0  --   --  5.8* 4.0 4.0 4.1  CL 107 100*  --   --  103 99* 106 106  CO2 21* 21*  --   --  21* 23 20* 21*  GLUCOSE 114* 107*  --   --  480* 169* 104* 136*  BUN 26* 24*  --   --  17 18 14 10   CREATININE 0.81 0.68  --   --  0.61 0.63 0.54* 0.56*  CALCIUM 9.0 9.0  --   --  7.7* 8.5* 8.5* 8.7*  MG 1.3* 1.2* 1.8 2.2  --   --  1.3* 1.4*  PHOS 4.7* 4.8*  --   --  3.7  --  5.1* 4.3   GFR: Estimated Creatinine Clearance: 132.8 mL/min (A) (by C-G formula based on SCr of 0.56 mg/dL (L)). Liver Function Tests:  Recent Labs Lab 05/30/17 0600 05/31/17 0508 05/31/17 0801 06/01/17 0316 06/02/17  0537  AST  --   --  93*  --   --   ALT  --   --  95*  --   --   ALKPHOS  --   --  223*  --   --   BILITOT  --   --  0.7  --   --   PROT  --   --  8.5*  --   --   ALBUMIN 2.5* 2.2* 2.4* 2.2* 2.3*   No results for input(s): LIPASE, AMYLASE in the last 168 hours. No results for input(s): AMMONIA in the last 168 hours. Coagulation Profile: No results for input(s): INR, PROTIME in the last 168 hours. Cardiac Enzymes: No results for input(s): CKTOTAL, CKMB, CKMBINDEX, TROPONINI in the last 168 hours. BNP (last 3 results) No results for input(s): PROBNP in the last 8760 hours. HbA1C: No results for input(s): HGBA1C in the last 72 hours. CBG:  Recent Labs Lab 06/01/17 2301 06/02/17 0400 06/02/17 0843 06/02/17 0943 06/02/17 1245  GLUCAP 85 90 69 156* 81   Lipid Profile: No results for input(s): CHOL, HDL, LDLCALC, TRIG, CHOLHDL, LDLDIRECT in the last 72 hours. Thyroid Function Tests: No results for input(s): TSH, T4TOTAL, FREET4, T3FREE, THYROIDAB in the last 72 hours. Anemia Panel: No results for input(s): VITAMINB12, FOLATE, FERRITIN, TIBC, IRON, RETICCTPCT in the last 72 hours. Urine analysis:    Component Value Date/Time   COLORURINE  YELLOW 05/20/2017 1742   APPEARANCEUR CLEAR 05/20/2017 1742   LABSPEC 1.020 05/20/2017 1742   PHURINE 7.0 05/20/2017 1742   GLUCOSEU NEGATIVE 05/20/2017 1742   HGBUR NEGATIVE 05/20/2017 1742   BILIRUBINUR NEGATIVE 05/20/2017 1742   KETONESUR NEGATIVE 05/20/2017 1742   PROTEINUR NEGATIVE 05/20/2017 1742   UROBILINOGEN 4.0 (H) 09/09/2014 1055   NITRITE NEGATIVE 05/20/2017 1742   LEUKOCYTESUR NEGATIVE 05/20/2017 1742   Recent Results (from the past 240 hour(s))  Gastrointestinal Panel by PCR , Stool     Status: Abnormal   Collection Time: 05/24/17 11:52 AM  Result Value Ref Range Status   Campylobacter species DETECTED (A) NOT DETECTED Final    Comment: RESULT CALLED TO, READ BACK BY AND VERIFIED WITH: [name] CARMICHAEL AT 2005 ON 05/25/17 RWW    Plesimonas shigelloides NOT DETECTED NOT DETECTED Final   Salmonella species NOT DETECTED NOT DETECTED Final   Yersinia enterocolitica NOT DETECTED NOT DETECTED Final   Vibrio species NOT DETECTED NOT DETECTED Final   Vibrio cholerae NOT DETECTED NOT DETECTED Final   Enteroaggregative E coli (EAEC) NOT DETECTED NOT DETECTED Final   Enterotoxigenic E coli (ETEC) NOT DETECTED NOT DETECTED Final   Shiga like toxin producing E coli (STEC) DETECTED (A) NOT DETECTED Final    Comment: RESULT CALLED TO, READ BACK BY AND VERIFIED WITH: [name] CARMICHAEL AT 2005 ON 05/25/17 RWW    E. coli O157 NOT DETECTED NOT DETECTED Final   Shigella/Enteroinvasive E coli (EIEC) NOT DETECTED NOT DETECTED Final   Cryptosporidium NOT DETECTED NOT DETECTED Final   Cyclospora cayetanensis NOT DETECTED NOT DETECTED Final   Entamoeba histolytica NOT DETECTED NOT DETECTED Final   Giardia lamblia DETECTED (A) NOT DETECTED Final   Adenovirus F40/41 NOT DETECTED NOT DETECTED Final   Astrovirus NOT DETECTED NOT DETECTED Final   Norovirus GI/GII NOT DETECTED NOT DETECTED Final   Rotavirus A NOT DETECTED NOT DETECTED Final   Sapovirus (I, II, IV, and V) DETECTED (A) NOT  DETECTED Final  CSF culture with Stat gram stain     Status:  None (Preliminary result)   Collection Time: 05/29/17 12:19 PM  Result Value Ref Range Status   Specimen Description CSF  Final   Special Requests NONE  Final   Gram Stain   Final    NO WBC SEEN YEAST CORRECTED RESULTS PREVIOUSLY REPORTED AS: NO ORGANISMS SEEN CORRECTED RESULTS CALLED TO: M HARPER,RN AT 1525 05/29/17 BY L BENFIELD    Culture   Final    FEW STAPHYLOCOCCUS EPIDERMIDIS CRITICAL RESULT CALLED TO, READ BACK BY AND VERIFIED WITH: DR Drue Second 05/30/17 @ 1119 M VESTAL CONTINUING TO HOLD    Report Status PENDING  Incomplete   Organism ID, Bacteria STAPHYLOCOCCUS EPIDERMIDIS  Final      Susceptibility   Staphylococcus epidermidis - MIC*    CIPROFLOXACIN <=0.5 SENSITIVE Sensitive     ERYTHROMYCIN >=8 RESISTANT Resistant     GENTAMICIN <=0.5 SENSITIVE Sensitive     OXACILLIN >=4 RESISTANT Resistant     TETRACYCLINE 2 SENSITIVE Sensitive     VANCOMYCIN 2 SENSITIVE Sensitive     TRIMETH/SULFA >=320 RESISTANT Resistant     CLINDAMYCIN >=8 RESISTANT Resistant     RIFAMPIN <=0.5 SENSITIVE Sensitive     Inducible Clindamycin NEGATIVE Sensitive     * FEW STAPHYLOCOCCUS EPIDERMIDIS      Radiology Studies: No results found.  Scheduled Meds: . acetaminophen  650 mg Oral Q24H  . azithromycin  1,200 mg Per Tube Weekly  . chlorhexidine  15 mL Mouth Rinse BID  . Chlorhexidine Gluconate Cloth  6 each Topical Daily  . diphenhydrAMINE  25 mg Oral Q24H  . flucytosine  1,500 mg Per Tube Q6H  . insulin aspart  2-6 Units Subcutaneous Q4H  . mouth rinse  15 mL Mouth Rinse q12n4p  . metoprolol tartrate  5 mg Intravenous Q8H  . potassium chloride  40 mEq Oral Daily  . sulfamethoxazole-trimethoprim  10 mL Per Tube Daily   Continuous Infusions: . sodium chloride 250 mL (06/01/17 0700)  . amphotericin  B  Liposome (AMBISOME) ADULT IV Stopped (06/01/17 2038)  . dextrose 5 % and 0.45% NaCl 50 mL/hr at 06/02/17 1100  . feeding  supplement (VITAL AF 1.2 CAL) 1,000 mL (06/02/17 1100)  . levETIRAcetam Stopped (06/02/17 0936)  . linezolid (ZYVOX) IV Stopped (06/02/17 1153)  . sodium chloride    . sodium chloride       LOS: 13 days   Time spent: 25 minutes.  Hazeline Junker, MD [facility] Pager [phone]  If 7PM-7AM, please contact night-coverage [email or web address] Password [email or web address] 06/02/2017, 2:51 PM

## 2017-06-02 NOTE — Progress Notes (Signed)
Pharmacy Consult Note:  Electrolyte Replacement  26 yom currently on treatment for cryptococcal meningitis and strep pneumo bacteremia. Pharmacy consulted by ID to manage electrolyte replacement. SCr remains WNL at 0.56.   K WNL (4.1) on KCl 40meq daily Mag low again at 1.4 despite 6gm magnesium yesterday  Plan: Continue daily potassium Magnesium 4gm IV x 1 F/u AM lytes  Allie BossierApryl Anderson, PharmD PGY1 Pharmacy Resident [phone] (Pager) 06/02/2017 8:15 AM

## 2017-06-02 NOTE — Progress Notes (Signed)
Pt had clear fluid draining out of staples in head from previous drain, Dr Danielle DessElsner at bedside and made aware, orders received for wound care. Marisue Ivanobyn Virda Betters RN

## 2017-06-02 NOTE — Progress Notes (Signed)
Echocardiogram 2D Echocardiogram has been performed.  Dorothey BasemanReel, Kojo Liby M 06/02/2017, 3:37 PM

## 2017-06-03 DIAGNOSIS — G039 Meningitis, unspecified: Secondary | ICD-10-CM

## 2017-06-03 LAB — RENAL FUNCTION PANEL
ALBUMIN: 2.4 g/dL — AB (ref 3.5–5.0)
ANION GAP: 5 (ref 5–15)
BUN: 11 mg/dL (ref 6–20)
CO2: 23 mmol/L (ref 22–32)
Calcium: 8.6 mg/dL — ABNORMAL LOW (ref 8.9–10.3)
Chloride: 107 mmol/L (ref 101–111)
Creatinine, Ser: 0.62 mg/dL (ref 0.61–1.24)
Glucose, Bld: 131 mg/dL — ABNORMAL HIGH (ref 65–99)
Phosphorus: 5.1 mg/dL — ABNORMAL HIGH (ref 2.5–4.6)
Potassium: 4.1 mmol/L (ref 3.5–5.1)
SODIUM: 135 mmol/L (ref 135–145)

## 2017-06-03 LAB — GLUCOSE, CAPILLARY
GLUCOSE-CAPILLARY: 83 mg/dL (ref 65–99)
GLUCOSE-CAPILLARY: 85 mg/dL (ref 65–99)
GLUCOSE-CAPILLARY: 90 mg/dL (ref 65–99)
GLUCOSE-CAPILLARY: 94 mg/dL (ref 65–99)
Glucose-Capillary: 84 mg/dL (ref 65–99)
Glucose-Capillary: 101 mg/dL — ABNORMAL HIGH (ref 65–99)

## 2017-06-03 LAB — CBC WITH DIFFERENTIAL/PLATELET
BASOS ABS: 0 10*3/uL (ref 0.0–0.1)
BASOS PCT: 2 %
EOS ABS: 0.1 10*3/uL (ref 0.0–0.7)
EOS PCT: 5 %
HCT: 28.7 % — ABNORMAL LOW (ref 39.0–52.0)
HEMOGLOBIN: 9.3 g/dL — AB (ref 13.0–17.0)
LYMPHS ABS: 0.4 10*3/uL — AB (ref 0.7–4.0)
Lymphocytes Relative: 18 %
MCH: 29.3 pg (ref 26.0–34.0)
MCHC: 32.4 g/dL (ref 30.0–36.0)
MCV: 90.5 fL (ref 78.0–100.0)
Monocytes Absolute: 0.3 10*3/uL (ref 0.1–1.0)
Monocytes Relative: 13 %
NEUTROS PCT: 62 %
Neutro Abs: 1.3 10*3/uL — ABNORMAL LOW (ref 1.7–7.7)
PLATELETS: 371 10*3/uL (ref 150–400)
RBC: 3.17 MIL/uL — AB (ref 4.22–5.81)
RDW: 13.7 % (ref 11.5–15.5)
WBC: 2 10*3/uL — ABNORMAL LOW (ref 4.0–10.5)

## 2017-06-03 LAB — COMPREHENSIVE METABOLIC PANEL
ALK PHOS: 183 U/L — AB (ref 38–126)
ALT: 81 U/L — AB (ref 17–63)
AST: 73 U/L — ABNORMAL HIGH (ref 15–41)
Albumin: 2.3 g/dL — ABNORMAL LOW (ref 3.5–5.0)
Anion gap: 5 (ref 5–15)
BUN: 11 mg/dL (ref 6–20)
CALCIUM: 8.7 mg/dL — AB (ref 8.9–10.3)
CO2: 23 mmol/L (ref 22–32)
CREATININE: 0.62 mg/dL (ref 0.61–1.24)
Chloride: 107 mmol/L (ref 101–111)
Glucose, Bld: 132 mg/dL — ABNORMAL HIGH (ref 65–99)
Potassium: 4.1 mmol/L (ref 3.5–5.1)
Sodium: 135 mmol/L (ref 135–145)
Total Bilirubin: 0.3 mg/dL (ref 0.3–1.2)
Total Protein: 7.5 g/dL (ref 6.5–8.1)

## 2017-06-03 LAB — MAGNESIUM: MAGNESIUM: 1.3 mg/dL — AB (ref 1.7–2.4)

## 2017-06-03 MED ORDER — MAGNESIUM SULFATE 4 GM/100ML IV SOLN
4.0000 g | Freq: Once | INTRAVENOUS | Status: AC
  Administered 2017-06-03: 4 g via INTRAVENOUS
  Filled 2017-06-03: qty 100

## 2017-06-03 NOTE — Progress Notes (Signed)
Pharmacy Consult Note:  Electrolyte Replacement  26 yom currently on treatment for cryptococcal meningitis and strep pneumo bacteremia. Pharmacy consulted by ID to manage electrolyte replacement. SCr remains WNL at 0.62   K WNL (4.1) on KCl 40meq daily Mag low again at 1.3 despite 4gm magnesium yesterday  Patient continues to have loose stools which may be contributing to magnesium loss.  Plan: Continue daily potassium Magnesium 4gm IV x 1 F/u AM lytes  Allie BossierApryl Mariadel Mruk, PharmD PGY1 Pharmacy Resident [phone] (Pager) 06/03/2017 8:54 AM

## 2017-06-03 NOTE — Progress Notes (Signed)
Patient noted to have more drainage from wound on the top of his head. Called and spoke with Dr. Danielle DessElsner who would like dressing changed as needed and he will revaluate patient in the morning.

## 2017-06-03 NOTE — Progress Notes (Signed)
PROGRESS NOTE  Justin Ball  ZOX:[id number] DOB: 02/09/1991 DOA: 05/20/2017 PCP: Randall HissVan Dam, [name], MD   Brief Narrative: Justin Ball is a 26 y.o. male with a history of AIDS, noncompliance, syphilis, bipolar disorder who presented on 5/27 after being pulled from his car unresponsive with concern for new-onset seizures, postictal on arrival. He had been seen in ED 3 days prior with headaches, and discharged afebrile, well-appearing with negative neuroimaging. He was admitted to the ICU, intubated. LP performed demonstrated yeast and he was treated with amphotericin and flucytosine, in addition to vancomycin and zosyn, for cryptococcal meningitis. EEG showed PLEDs, keppra started. The day after admission the patient demonstrated a fixed right pupil and posturing. Mannitol was started, CT head negative, and ventriculostomy was placed by neurosurgery due to concern for herniation. Blood cultures grew positive with S. pneumoniae. CSF culture later grew S. epidermidis and linezolid was added on 6/6. He was extubated 6/2 and mental status is noted to still be altered from baseline. The patient was transferred to SDU and weaned from precedex on 6/8 to the Hospitalists service 6/9. Neurology, neurosurgery, and ID are consulting, and plan is for discharge to CIR once patient is stable and able to tolerate 3 hours of therapy daily.   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  - Continue amphotericin and flucytosine per ID - Continue  linezolid x14 days (6/6 - last dose on 6/19) - Continue keppra per neurology for seizure Tx - Repeat LP next week - EVD removed 6/8. Ventriculostomy site leaking intermittently, neurosurgery following.   Acute encephalopathy: Multifactorial with meningoencephalitis, seizures, ischemic infarcts (no antiplatelet per neuro), metabolic derangements and medications; likely complicated by delirium. - Treat underlying conditions. - Ativan prn agitation, off precedex since 6/8 - Avoid haldol with seizure d/o - SLP evaluating, giving dysphagia 2 diet in addition to TF's  Streptococcus pneumoniae bacteremia:  - Linezolid per ID x14 days - TTE ordered: No vegetations noted  Bordatella pneumonia:  - Was treated with doxycycline per ID  AIDS: CD4 count April 2018 was 2.  - Hold ART per ID - Continue bactrim daily and weekly azithromycin (given 6/7) ppx  Electrolyte derangements: Hypomagnesemia noted today - Repleting Mg and K per pharmacy   Neutropenia: Suspected due to flucytosine.  - Monitor.  Diarrhea: Acute, likely from tube feeds.  - Flexiseal - Anti-diarrheals ok  Chronic hepatitis B:  - Follow up with ID  DVT prophylaxis: SCDs Code Status: Full Family Communication: None at bedside this AM Disposition Plan: Continue SDU for now. Eventual DC to CIR.   Consultants:   PCCM primary 5/27 - 6/8  Neurology  Neurosurgery  ID  Procedures:  OETT 5/27 - 6/3 OGT 5/27 - 6/3 NGT >>>  Right ventriculostomy 5/28 >>>6/8 LUE TL PICC 6/3 >>> Foley 6/4 >>> Flexiseal 6/7  Echocardiogram 06/02/2017: - Left ventricle: The cavity size was normal. Wall thickness was   normal. Systolic function was vigorous. The estimated ejection   fraction was in the range of 65% to 70%. Left ventricular  diastolic function parameters were normal. - Atrial septum: No defect or patent foramen ovale was identified.  Impressions: - No cardiac source of emboli was  indentified.  Cultures/Antimicrobials: Blood Cultures x2 5/27: 1 of 2 +Streptococcus pneumoniae  Blood Fungal Culture 5/27:  Negative  CSF Culture 5/27:  Cryptococcus neoformans  Tracheal Aspirate Culture 5/28:  MODERATE BORDETELLA BRONCHISEPTICA  Blood Cultures x2 5/29:  Negative  Gastrointestinal PCR 5/31:  Campylobacter / E coli (shiga-like toxin producing) / Giardia lamblia / Sapovirus  CSF Culture 6/5: Yeast & few S. epidermidis  Ambisome 5/27 >>> Flucytosine 5/27 >>> Rocephin 5/27 >>> Bactrim 5/27 >>> Azithromycin qweek 5/31 >>> Doxycycline 6/2 >>> linezolid 6/6>>>  Subjective: Confused, answers questions w/yes and no intermittently. Denies pain and hunger.   Objective: Vitals:   06/03/17 0317 06/03/17 0400 06/03/17 0455 06/03/17 0820  BP: 105/66 120/62    Pulse: 96 (!) 105    Resp: 14 15    Temp: 97.9 F (36.6 C)   98.1 F (36.7 C)  TempSrc: Axillary   Oral  SpO2: 100% 100%  100%  Weight:   68.4 kg (150 lb 12.7 oz)   Height:        Intake/Output Summary (Last 24 hours) at 06/03/17 0844 Last data filed at 06/03/17 0500  Gross per 24 hour  Intake          6626.67 ml  Output             4300 ml  Net          2326.67 ml   Filed Weights   06/01/17 0328 06/02/17 0340 06/03/17 0455  Weight: 64.3 kg (141 lb 12.1 oz) 67.1 kg (147 lb 14.9 oz) 68.4 kg (150 lb 12.7 oz)    Examination: General exam: Thin 26 y.o. male in no distress laying on left side HEENT: Feeding tube in nare Respiratory system: Non-labored breathing. Clear to auscultation bilaterally.  Cardiovascular system: Regular rate and rhythm. No murmur, rub, or gallop. No JVD, and no pedal edema. Gastrointestinal system: Abdomen soft, non-tender, non-distended, with normoactive bowel sounds. No organomegaly or masses felt. Flexiseal in place, draining light brown liquid stool. GU: Foley in situ Neuro: Awoke to voice, frequently falling asleep again. Not cooperative with exam.  Extremities: Warm, no  deformities. Mittens on without abrasions on wrists.  Skin: Scattered hyperpigmented macules throughout. No drainage.  Psychiatry: UTD  Data Reviewed: I have personally reviewed following labs and imaging studies  CBC:  Recent Labs Lab 05/30/17 0445 05/31/17 0255 06/01/17 0316 06/02/17 0537 06/03/17 0404  WBC 2.5* 2.5* 1.8* 2.7* 2.0*  NEUTROABS 1.7 1.8 1.3* 1.8 1.3*  HGB 8.2* 10.1* 10.2* 9.9* 9.3*  HCT 26.3* 30.6* 32.1* 30.3* 28.7*  MCV 93.9 89.7 90.7 89.6 90.5  PLT 215 301 327 368 371   Basic Metabolic Panel:  Recent Labs Lab 05/30/17 0600 05/30/17 1713 05/31/17 0255 05/31/17 0508 05/31/17 0801 06/01/17 0316 06/02/17 0537 06/03/17 0404  NA 131*  --   --  127* 132* 131* 132* 135  135  K 4.0  --   --  5.8* 4.0 4.0 4.1 4.1  4.1  CL 100*  --   --  103 99* 106 106 107  107  CO2 21*  --   --  21* 23 20* 21* 23  23  GLUCOSE 107*  --   --  480* 169* 104* 136* 132*  131*  BUN 24*  --   --  17 18 14 10 11   11  CREATININE 0.68  --   --  0.61 0.63 0.54* 0.56* 0.62  0.62  CALCIUM 9.0  --   --  7.7* 8.5* 8.5* 8.7* 8.7*  8.6*  MG 1.2* 1.8 2.2  --   --  1.3* 1.4* 1.3*  PHOS 4.8*  --   --  3.7  --  5.1* 4.3 5.1*   GFR: Estimated Creatinine Clearance: 135.4 mL/min (by C-G formula based on SCr of 0.62 mg/dL). Liver Function Tests:  Recent Labs Lab 05/31/17 0508 05/31/17 0801 06/01/17 0316 06/02/17 0537 06/03/17 0404  AST  --  93*  --   --  73*  ALT  --  95*  --   --  81*  ALKPHOS  --  223*  --   --  183*  BILITOT  --  0.7  --   --  0.3  PROT  --  8.5*  --   --  7.5  ALBUMIN 2.2* 2.4* 2.2* 2.3* 2.3*  2.4*   No results for input(s): LIPASE, AMYLASE in the last 168 hours. No results for input(s): AMMONIA in the last 168 hours. Coagulation Profile: No results for input(s): INR, PROTIME in the last 168 hours. Cardiac Enzymes: No results for input(s): CKTOTAL, CKMB, CKMBINDEX, TROPONINI in the last 168 hours. BNP (last 3 results) No results for input(s): PROBNP  in the last 8760 hours. HbA1C: No results for input(s): HGBA1C in the last 72 hours. CBG:  Recent Labs Lab 06/02/17 1717 06/02/17 1941 06/02/17 2253 06/03/17 0314 06/03/17 0823  GLUCAP 104* 101* 102* 83 84   Lipid Profile: No results for input(s): CHOL, HDL, LDLCALC, TRIG, CHOLHDL, LDLDIRECT in the last 72 hours. Thyroid Function Tests: No results for input(s): TSH, T4TOTAL, FREET4, T3FREE, THYROIDAB in the last 72 hours. Anemia Panel: No results for input(s): VITAMINB12, FOLATE, FERRITIN, TIBC, IRON, RETICCTPCT in the last 72 hours. Urine analysis:    Component Value Date/Time   COLORURINE YELLOW 05/20/2017 1742   APPEARANCEUR CLEAR 05/20/2017 1742   LABSPEC 1.020 05/20/2017 1742   PHURINE 7.0 05/20/2017 1742   GLUCOSEU NEGATIVE 05/20/2017 1742   HGBUR NEGATIVE 05/20/2017 1742   BILIRUBINUR NEGATIVE 05/20/2017 1742   KETONESUR NEGATIVE 05/20/2017 1742   PROTEINUR NEGATIVE 05/20/2017 1742   UROBILINOGEN 4.0 (H) 09/09/2014 1055   NITRITE NEGATIVE 05/20/2017 1742   LEUKOCYTESUR NEGATIVE 05/20/2017 1742   Recent Results (from the past 240 hour(s))  Gastrointestinal Panel by PCR , Stool     Status: Abnormal   Collection Time: 05/24/17 11:52 AM  Result Value Ref Range Status   Campylobacter species DETECTED (A) NOT DETECTED Final    Comment: RESULT CALLED TO, READ BACK BY AND VERIFIED WITH: [name] CARMICHAEL AT 2005 ON 05/25/17 RWW    Plesimonas shigelloides NOT DETECTED NOT DETECTED Final   Salmonella species NOT DETECTED NOT DETECTED Final   Yersinia enterocolitica NOT DETECTED NOT DETECTED Final   Vibrio species NOT DETECTED NOT DETECTED Final   Vibrio cholerae NOT DETECTED NOT DETECTED Final   Enteroaggregative E coli (EAEC) NOT DETECTED NOT DETECTED Final   Enterotoxigenic E coli (ETEC) NOT DETECTED NOT DETECTED Final   Shiga like toxin producing E coli (STEC) DETECTED (A) NOT DETECTED Final    Comment: RESULT CALLED TO, READ BACK BY AND VERIFIED WITH: [name]  CARMICHAEL AT 2005 ON 05/25/17 RWW    E. coli O157 NOT DETECTED NOT DETECTED Final   Shigella/Enteroinvasive E coli (EIEC) NOT DETECTED NOT DETECTED Final   Cryptosporidium NOT DETECTED NOT DETECTED Final  Cyclospora cayetanensis NOT DETECTED NOT DETECTED Final   Entamoeba histolytica NOT DETECTED NOT DETECTED Final   Giardia lamblia DETECTED (A) NOT DETECTED Final   Adenovirus F40/41 NOT DETECTED NOT DETECTED Final   Astrovirus NOT DETECTED NOT DETECTED Final   Norovirus GI/GII NOT DETECTED NOT DETECTED Final   Rotavirus A NOT DETECTED NOT DETECTED Final   Sapovirus (I, II, IV, and V) DETECTED (A) NOT DETECTED Final  CSF culture with Stat gram stain     Status: None (Preliminary result)   Collection Time: 05/29/17 12:19 PM  Result Value Ref Range Status   Specimen Description CSF  Final   Special Requests NONE  Final   Gram Stain   Final    NO WBC SEEN YEAST CORRECTED RESULTS PREVIOUSLY REPORTED AS: NO ORGANISMS SEEN CORRECTED RESULTS CALLED TO: M HARPER,RN AT 1525 05/29/17 BY L BENFIELD    Culture   Final    FEW STAPHYLOCOCCUS EPIDERMIDIS CRITICAL RESULT CALLED TO, READ BACK BY AND VERIFIED WITH: DR Drue Second 05/30/17 @ 1119 M VESTAL CONTINUING TO HOLD    Report Status PENDING  Incomplete   Organism ID, Bacteria STAPHYLOCOCCUS EPIDERMIDIS  Final      Susceptibility   Staphylococcus epidermidis - MIC*    CIPROFLOXACIN <=0.5 SENSITIVE Sensitive     ERYTHROMYCIN >=8 RESISTANT Resistant     GENTAMICIN <=0.5 SENSITIVE Sensitive     OXACILLIN >=4 RESISTANT Resistant     TETRACYCLINE 2 SENSITIVE Sensitive     VANCOMYCIN 2 SENSITIVE Sensitive     TRIMETH/SULFA >=320 RESISTANT Resistant     CLINDAMYCIN >=8 RESISTANT Resistant     RIFAMPIN <=0.5 SENSITIVE Sensitive     Inducible Clindamycin NEGATIVE Sensitive     * FEW STAPHYLOCOCCUS EPIDERMIDIS      Radiology Studies: No results found.  Scheduled Meds: . acetaminophen  650 mg Oral Q24H  . azithromycin  1,200 mg Per Tube Weekly  .  chlorhexidine  15 mL Mouth Rinse BID  . Chlorhexidine Gluconate Cloth  6 each Topical Daily  . diphenhydrAMINE  25 mg Oral Q24H  . flucytosine  1,500 mg Per Tube Q6H  . insulin aspart  2-6 Units Subcutaneous Q4H  . mouth rinse  15 mL Mouth Rinse q12n4p  . metoprolol tartrate  5 mg Intravenous Q8H  . potassium chloride  40 mEq Oral Daily  . sulfamethoxazole-trimethoprim  10 mL Per Tube Daily   Continuous Infusions: . sodium chloride 1,000 mL (06/02/17 2103)  . amphotericin  B  Liposome (AMBISOME) ADULT IV Stopped (06/02/17 2107)  . dextrose 5 % and 0.45% NaCl 50 mL/hr at 06/02/17 2103  . feeding supplement (VITAL AF 1.2 CAL) 1,000 mL (06/03/17 0841)  . levETIRAcetam Stopped (06/02/17 2121)  . linezolid (ZYVOX) IV Stopped (06/02/17 2204)  . sodium chloride    . sodium chloride       LOS: 14 days   Time spent: 25 minutes.  Hazeline Junker, MD [facility] Pager [phone]  If 7PM-7AM, please contact night-coverage [email or web address] Password [facility] 06/03/2017, 8:44 AM

## 2017-06-03 NOTE — Progress Notes (Signed)
Patient continued to be restless through the night, initially unchanged by PRN ativan dose, he did have brief periods of rest intermittently. He also had moderate amounts of drainage from the wound where his drain was removed, provider notified and area cleansed as ordered. VSS.

## 2017-06-03 NOTE — Progress Notes (Signed)
Patient is very agitated - He attempted to get out of bed and dropped IV pole over the bed . Pt did not sustained any injury. Continue to monitor  patient  to prevent self inflicted injury- Medical is team notified - ativan 1 mg IV was administered to patient.

## 2017-06-03 NOTE — Progress Notes (Signed)
Pt very restless with turning in bed and wrapping lines and tubes around his body, bath and sheets changed. Pt pulled up but still very restless. Staples removed and replaced by MD at bedside.

## 2017-06-03 NOTE — Progress Notes (Signed)
Patient ID: Justin Ball, male   DOB: 11/14/1991, 26 y.o.   MRN: [id number] Vital signs are stable His ventriculostomy site drained again last night Currently it appears dry however in light of repeated leakage by every staple it today by removing the old staples and changing the orientation of the new staples. At the current time it appears dry The wound was dressed with Betadine solution.

## 2017-06-04 ENCOUNTER — Inpatient Hospital Stay (HOSPITAL_COMMUNITY): Payer: No Typology Code available for payment source

## 2017-06-04 LAB — COMPREHENSIVE METABOLIC PANEL
ALBUMIN: 2.5 g/dL — AB (ref 3.5–5.0)
ALT: 90 U/L — AB (ref 17–63)
AST: 81 U/L — AB (ref 15–41)
Alkaline Phosphatase: 190 U/L — ABNORMAL HIGH (ref 38–126)
Anion gap: 4 — ABNORMAL LOW (ref 5–15)
BUN: 15 mg/dL (ref 6–20)
CHLORIDE: 105 mmol/L (ref 101–111)
CO2: 26 mmol/L (ref 22–32)
CREATININE: 0.65 mg/dL (ref 0.61–1.24)
Calcium: 8.9 mg/dL (ref 8.9–10.3)
GFR calc Af Amer: >60 mL/min (ref >60)
GLUCOSE: 98 mg/dL (ref 65–99)
POTASSIUM: 3.9 mmol/L (ref 3.5–5.1)
SODIUM: 135 mmol/L (ref 135–145)
Total Bilirubin: 0.4 mg/dL (ref 0.3–1.2)
Total Protein: 7.6 g/dL (ref 6.5–8.1)

## 2017-06-04 LAB — GLUCOSE, CAPILLARY
Glucose-Capillary: 89 mg/dL (ref 65–99)
Glucose-Capillary: 77 mg/dL (ref 65–99)
Glucose-Capillary: 82 mg/dL (ref 65–99)
Glucose-Capillary: 93 mg/dL (ref 65–99)
Glucose-Capillary: 94 mg/dL (ref 65–99)

## 2017-06-04 LAB — CBC WITH DIFFERENTIAL/PLATELET
BASOS ABS: 0.1 10*3/uL (ref 0.0–0.1)
Basophils Relative: 3 %
EOS PCT: 13 %
Eosinophils Absolute: 0.3 10*3/uL (ref 0.0–0.7)
HCT: 30.7 % — ABNORMAL LOW (ref 39.0–52.0)
Hemoglobin: 10.4 g/dL — ABNORMAL LOW (ref 13.0–17.0)
LYMPHS ABS: 0.4 10*3/uL — AB (ref 0.7–4.0)
Lymphocytes Relative: 21 %
MCH: 31 pg (ref 26.0–34.0)
MCHC: 33.9 g/dL (ref 30.0–36.0)
MCV: 91.4 fL (ref 78.0–100.0)
MONO ABS: 0.1 10*3/uL (ref 0.1–1.0)
Monocytes Relative: 7 %
NEUTROS PCT: 56 %
Neutro Abs: 1.2 10*3/uL — ABNORMAL LOW (ref 1.7–7.7)
PLATELETS: 352 10*3/uL (ref 150–400)
RBC: 3.36 MIL/uL — AB (ref 4.22–5.81)
RDW: 14.1 % (ref 11.5–15.5)
WBC: 2.1 10*3/uL — AB (ref 4.0–10.5)

## 2017-06-04 LAB — MAGNESIUM: MAGNESIUM: 1.6 mg/dL — AB (ref 1.7–2.4)

## 2017-06-04 MED ORDER — MAGNESIUM SULFATE 50 % IJ SOLN
6.0000 g | Freq: Once | INTRAVENOUS | Status: AC
  Administered 2017-06-04: 6 g via INTRAVENOUS
  Filled 2017-06-04: qty 12

## 2017-06-04 MED ORDER — POTASSIUM CHLORIDE 20 MEQ/15ML (10%) PO SOLN
20.0000 meq | Freq: Once | ORAL | Status: AC
  Administered 2017-06-04: 20 meq via ORAL
  Filled 2017-06-04: qty 15

## 2017-06-04 NOTE — Progress Notes (Signed)
Physical Therapy Treatment Patient Details Name: Justin Ball MRN: [id number] DOB: 05-Nov-1991 Today's Date: 06/04/2017    History of Present Illness Pt is a 26 year old man admitted s/p seizure activity having several days of recent headaches. 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 EVD was placed after acute decompensation on 5/28 with decorticate posturing and asymmetric pupils. 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 PMH includes: syphilis, substance abuse, soft palate ulceration, scabies, neuromuscular disorder, HIV/AIDS, depression, bipolar disorder, anxiety    PT Comments    Patient continues to require +2 assist for mobility and disoriented X4. Gait not attempted this session. Pt tolerated sitting EOB and stood X2 with HR up to 136. Mother present end of session. Continue to progress as tolerated.   Follow Up Recommendations  CIR     Equipment Recommendations  Other (comment) (TBD next venue)    Recommendations for Other Services Rehab consult     Precautions / Restrictions Precautions Precautions: Fall Precaution Comments: flexiseal, foley, NG tube, IV  Restrictions Weight Bearing Restrictions: No    Mobility  Bed Mobility Overal bed mobility: Needs Assistance Bed Mobility: Supine to Sit;Sit to Supine     Supine to sit: Mod assist Sit to supine: Max assist   General bed mobility comments: vc for initiation of task and assist to come into sitting and bring bilat LE off EOB; use of bed pad to position hips sitting EOB and pt with heavy posterior   Transfers Overall transfer level: Needs assistance Equipment used: 2 person hand held assist Transfers: Sit to/from Stand Sit to Stand: +2 physical assistance;Max assist         General transfer comment: max +2 required for balance in standing due to posterior bias; pt assisted with and initiated standing with vc  Ambulation/Gait                  Stairs            Wheelchair Mobility    Modified Rankin (Stroke Patients Only)       Balance Overall balance assessment: Needs assistance Sitting-balance support: Bilateral upper extremity supported;Feet supported Sitting balance-Leahy Scale: Zero Sitting balance - Comments: Sat EOB 10 minutes with pt initiating return to supine     Standing balance-Leahy Scale: Zero Standing balance comment: requires total +2 (A) with initiation of descend to bed without verbal cues                             Cognition Arousal/Alertness: Lethargic Behavior During Therapy: Restless Overall Cognitive Status: Impaired/Different from baseline Area of Impairment: Following commands;Safety/judgement;Awareness;Attention;Orientation                 Orientation Level: Disoriented to;Person;Place;Time;Situation Current Attention Level: Focused   Following Commands: Follows one step commands inconsistently Safety/Judgement: Decreased awareness of safety;Decreased awareness of deficits Awareness: Intellectual Problem Solving: Slow processing;Difficulty sequencing General Comments: Pt does not state name. pt reports "no food" when offered food. Pt reports "mom" when mother arrives while patient in a seated position. pt initiated sit<>stand from bed with verbal cue from therapist.       Exercises      General Comments General comments (skin integrity, edema, etc.): HR up to 136 in standing      Pertinent Vitals/Pain Pain Assessment: No/denies pain    Home Living  Prior Function            PT Goals (current goals can now be found in the care plan section) Acute Rehab PT Goals Patient Stated Goal: pt unable Progress towards PT goals: Progressing toward goals    Frequency    Min 4X/week      PT Plan Current plan remains appropriate    Co-evaluation PT/OT/SLP Co-Evaluation/Treatment: Yes Reason for  Co-Treatment: Complexity of the patient's impairments (multi-system involvement);Necessary to address cognition/behavior during functional activity;For patient/therapist safety;To address functional/ADL transfers PT goals addressed during session: Mobility/safety with mobility;Balance OT goals addressed during session: ADL's and self-care;Proper use of Adaptive equipment and DME;Strengthening/ROM      AM-PAC PT "6 Clicks" Daily Activity  Outcome Measure  Difficulty turning over in bed (including adjusting bedclothes, sheets and blankets)?: Total Difficulty moving from lying on back to sitting on the side of the bed? : Total Difficulty sitting down on and standing up from a chair with arms (e.g., wheelchair, bedside commode, etc,.)?: Total Help needed moving to and from a bed to chair (including a wheelchair)?: Total Help needed walking in hospital room?: A Lot Help needed climbing 3-5 steps with a railing? : Total 6 Click Score: 7    End of Session Equipment Utilized During Treatment: Gait belt Activity Tolerance: Patient tolerated treatment well Patient left: in bed;with call bell/phone within reach;with bed alarm set;with restraints reapplied;Other (comment) (pt in 4 point restraints ) Nurse Communication: Mobility status PT Visit Diagnosis: Other symptoms and signs involving the nervous system (R29.898);Muscle weakness (generalized) (M62.81)     Time: 1010-1034 PT Time Calculation (min) (ACUTE ONLY): 24 min  Charges:  $Therapeutic Activity: 8-22 mins                    G Codes:       Justin Ball, PTA Pager: [phone]     Justin Ball 06/04/2017, 11:35 AM

## 2017-06-04 NOTE — Progress Notes (Signed)
I contacted pt's grandmother, Justin Ball, again by phone and have arranged a meeting with grandmother, Fannie KneeSue and pt's Mom, Joseline for 4 30 tomorrow to discuss his progress and eventual rehab needs. I will follow up tomorrow. [phone]

## 2017-06-04 NOTE — Progress Notes (Signed)
Pharmacy Consult Note:  Electrolyte Replacement  26 yom currently on treatment for cryptococcal meningitis and strep pneumo bacteremia. Pharmacy consulted by ID to manage electrolyte replacement. SCr remains WNL at 0.62   K 3.9 on KCl 40meq daily Mg 1.6 and up from 1.3 despite Mg 4g IV x 1 yesterday  Plan: - Give an extra KCl 20 mEq x 1 today - Magnesium 6gm IV x 1 - F/u AM lytes  Thank you for allowing pharmacy to be a part of this patient's care.  Georgina PillionElizabeth Quadir Muns, PharmD, BCPS Clinical Pharmacist Pager: [phone] Clinical phone for 06/04/2017 from 7a-3:30p: [phone] If after 3:30p, please call main pharmacy at: [phone] 06/04/2017 12:12 PM

## 2017-06-04 NOTE — Progress Notes (Signed)
    [facility]    Date of Admission:  05/20/2017      ID: Justin Ball is a 26 y.o. man with CM, strep pneumo bacteremia, and staph epi meningitis.    Subjective: Sitting up in bed, working with OT. Remains nonverbal. Trying to follow commands- asked him to wiggle toes and he lifted up right leg.   Medications:  . acetaminophen  650 mg Oral Q24H  . azithromycin  1,200 mg Per Tube Weekly  . chlorhexidine  15 mL Mouth Rinse BID  . Chlorhexidine Gluconate Cloth  6 each Topical Daily  . diphenhydrAMINE  25 mg Oral Q24H  . flucytosine  1,500 mg Per Tube Q6H  . insulin aspart  2-6 Units Subcutaneous Q4H  . mouth rinse  15 mL Mouth Rinse q12n4p  . metoprolol tartrate  5 mg Intravenous Q8H  . potassium chloride  40 mEq Oral Daily  . sulfamethoxazole-trimethoprim  10 mL Per Tube Daily    Objective: Vital signs in last 24 hours: Temp:  [97.2 F (36.2 C)-99.1 F (37.3 C)] 98.2 F (36.8 C) (06/11 0745) Pulse Rate:  [97-115] 108 (06/11 0745) Resp:  [16-23] 23 (06/10 2003) BP: (107-131)/(64-94) 107/76 (06/11 0745) SpO2:  [99 %-100 %] 100 % (06/11 0745) Weight:  [150 lb 2.1 oz (68.1 kg)] 150 lb 2.1 oz (68.1 kg) (06/11 0538)   General: Thin young man sitting up in bed CV: tachycardic, no m/g/r Neuro: alert but not oriented and does not follow commands consistently   Lab Results  Recent Labs  06/03/17 0404 06/04/17 0516  WBC 2.0* 2.1*  HGB 9.3* 10.4*  HCT 28.7* 30.7*  NA 135  135 135  K 4.1  4.1 3.9  CL 107  107 105  CO2 23  23 26   BUN 11  11 15   CREATININE 0.62  0.62 0.65   Liver Panel  Recent Labs  06/03/17 0404 06/04/17 0516  PROT 7.5 7.6  ALBUMIN 2.3*  2.4* 2.5*  AST 73* 81*  ALT 81* 90*  ALKPHOS 183* 190*  BILITOT 0.3 0.4    Microbiology:    Assessment/Plan:  Cryptococcal meningitis: Continue induction therapy with ampho and flucytosine. CSF gram stain with yeast on 6/4. Repeat CSF studies needed, these have been  ordered. Continue to monitor electrolytes, appreciate pharmacy helping with this.  Staph epi meningitis: CSF cx positive on 6/5. Continue Linezolid (end date 6/14).  Leukopenia: WBC count 2.1, has remained stable. Likely flucytosine contributing. Will continue with therapy.  Loose stools: Likely due to tube feeds and acute illness. Continue to monitor. Ok to use imodium if needed.  Hepatitis B: Remained off treatment due to being off ART. Cannot restart ART due to cryptococcal meningitis.   Strep bacteremia: On Linezolid (stop date: through 6/15).     Attending note to follow  Rich Numberarly Nikiya Starn, MD Internal Medicine Resident, PGY-3 06/04/2017, 10:47 AM

## 2017-06-04 NOTE — Progress Notes (Signed)
  Speech Language Pathology Treatment: Dysphagia  Patient Details Name: Justin Ball R Linares MRN: [id number] DOB: 07/14/1991 Today's Date: 06/04/2017 Time: [id number] SLP Time Calculation (min) (ACUTE ONLY): 26 min  Assessment / Plan / Recommendation Clinical Impression  Pt alert and restless, uncomfortable and trying to reposition. Fed pt lunch with total assist with no signs of aspiration, though prolonged mastication still noted. Current diet remains appropriate, Pt able to to take pills whole with thin liquids if given instruction. Consider removal of NG tube. Will follow for tolerance.   HPI HPI: Pt is a 26 year old man admitted s/p seizure activity having several days of recent headaches. 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 EVD was placed after acute decompensation on 5/28 with decorticate posturing and asymmetric pupils. 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 PMH includes: syphilis, substance abuse, soft palate ulceration, scabies, neuromuscular disorder, HIV/AIDS, depression, bipolar disorder, anxiety      SLP Plan  Continue with current plan of care       Recommendations  Diet recommendations: Dysphagia 2 (fine chop);Thin liquid Liquids provided via: Cup;Straw Medication Administration: Whole meds with puree Supervision: Staff to assist with self feeding;Full supervision/cueing for compensatory strategies Compensations: Minimize environmental distractions;Slow rate;Small sips/bites Postural Changes and/or Swallow Maneuvers: Seated upright 90 degrees                General recommendations: Rehab consult Follow up Recommendations: Inpatient Rehab SLP Visit Diagnosis: Dysphagia, unspecified (R13.10) Plan: Continue with current plan of care       GO               [facility] Medical CenterBonnie Kameren Baade, MA CCC-SLP [phone]  Claudine MoutonDeBlois, Pallie Swigert Caroline 06/04/2017, 3:12 PM

## 2017-06-04 NOTE — Progress Notes (Signed)
Received call from Neuro PA Onalee Huaavid. Patient has continued CSF drainage from ventriculostomy incision site along with worsening neuro exam. Ordered stat CT HEAD. Will check in on pt after CT performed. Dr Conchita ParisNundkumar made aware.

## 2017-06-04 NOTE — Progress Notes (Signed)
Occupational Therapy Treatment Patient Details Name: Justin Ball MRN: [id number] DOB: [birth year] Today's Date: 06/04/2017    History of present illness Pt is a 26 year old man admitted s/p seizure activity having several days of recent headaches. 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 EVD was placed after acute decompensation on 5/28 with decorticate posturing and asymmetric pupils. 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 PMH includes: syphilis, substance abuse, soft palate ulceration, scabies, neuromuscular disorder, HIV/AIDS, depression, bipolar disorder, anxiety   OT comments  Pt currently disoriented x4 and reports "train" when asked "do you know where you are right now?" pt reports "no Train" when provided orientation and expressed several times of his disagreement with orientation provided. Pt more verbal with EOB sitting and did show awareness to mother presence in room.    Follow Up Recommendations  CIR;Supervision/Assistance - 24 hour    Equipment Recommendations  Other (comment)    Recommendations for Other Services Rehab consult    Precautions / Restrictions Precautions Precautions: Fall Precaution Comments: flexiseal, foley, NG tube, IV  Restrictions Weight Bearing Restrictions: No       Mobility Bed Mobility Overal bed mobility: Needs Assistance Bed Mobility: Supine to Sit     Supine to sit: Mod assist     General bed mobility comments: pt initiated supine to sit with cues but unable to sustain activation of core. pt required mod (A) to  progress to long sitting. total +2 mod (A) with pad use to pivot to eob. once eob pt with strong posterior lean .   Transfers Overall transfer level: Needs assistance Equipment used: 2 person hand held assist Transfers: Sit to/from [employer] Sit to Stand: +2 physical assistance;Max assist         General transfer comment: pt  with 4 point restraints at this time and return to supine in bed. Pt requesting bed level and for safety    Balance Overall balance assessment: Needs assistance Sitting-balance support: Bilateral upper extremity supported;Feet supported Sitting balance-Leahy Scale: Zero Sitting balance - Comments: Sat EOB 10 minutes with pt initiating return to supine     Standing balance-Leahy Scale: Zero Standing balance comment: requires total +2 (A) with initiation of descend to bed without verbal cues                            ADL either performed or assessed with clinical judgement   ADL Overall ADL's : Needs assistance/impaired   Eating/Feeding Details (indicate cue type and reason): declined PO intake     Upper Body Bathing: Total assistance   Lower Body Bathing: Total assistance           Toilet Transfer: +2 for physical assistance;Maximal assistance Toilet Transfer Details (indicate cue type and reason): strong posterior lean with current flexiseal           General ADL Comments: pt currently total (A) for all care. pt in 4 point restraints     Vision   Vision Assessment?: Vision impaired- to be further tested in functional context Additional Comments: pt does visually scan to the L and locate mother . pt verbalizes MOM   Perception     Praxis      Cognition Arousal/Alertness: Lethargic Behavior During Therapy: Restless Overall Cognitive Status: Impaired/Different from baseline Area of Impairment: Following commands;Safety/judgement;Awareness;Attention;Orientation                 Orientation Level:  Disoriented to;Person;Place;Time;Situation Current Attention Level: Focused   Following Commands: Follows one step commands inconsistently Safety/Judgement: Decreased awareness of safety;Decreased awareness of deficits Awareness: Intellectual Problem Solving: Slow processing;Difficulty sequencing General Comments: Pt does not state name. pt reports  "no food" when offered food. Pt reports "mom" when mother arrives while patient in a seated position. pt initiated sit<>stand from bed with verbal cue from therapist.         Exercises     Shoulder Instructions       General Comments      Pertinent Vitals/ Pain       Pain Assessment: No/denies pain  Home Living                                          Prior Functioning/Environment              Frequency  Min 3X/week        Progress Toward Goals  OT Goals(current goals can now be found in the care plan section)  Progress towards OT goals: Progressing toward goals  Acute Rehab OT Goals Patient Stated Goal: pt unable OT Goal Formulation: Patient unable to participate in goal setting Time For Goal Achievement: 06/15/17 Potential to Achieve Goals: Good ADL Goals Pt Will Perform Eating: with mod assist Pt Will Perform Grooming: bed level;sitting;with min assist Pt Will Transfer to Toilet: with mod assist;stand pivot transfer;bedside commode Pt/caregiver will Perform Home Exercise Program: Increased ROM;Increased strength;Both right and left upper extremity;With written HEP provided Additional ADL Goal #1: Pt will follow 1 step commands 25% of time Additional ADL Goal #2: Pt will be able to track object for 10 seconds to work on visual attention  Plan Discharge plan remains appropriate    Co-evaluation    PT/OT/SLP Co-Evaluation/Treatment: Yes Reason for Co-Treatment: Complexity of the patient's impairments (multi-system involvement);Necessary to address cognition/behavior during functional activity;For patient/therapist safety;To address functional/ADL transfers   OT goals addressed during session: ADL's and self-care;Proper use of Adaptive equipment and DME;Strengthening/ROM      AM-PAC PT "6 Clicks" Daily Activity     Outcome Measure   Help from another person eating meals?: Total Help from another person taking care of personal grooming?:  Total Help from another person toileting, which includes using toliet, bedpan, or urinal?: Total Help from another person bathing (including washing, rinsing, drying)?: Total Help from another person to put on and taking off regular upper body clothing?: Total Help from another person to put on and taking off regular lower body clothing?: Total 6 Click Score: 6    End of Session Equipment Utilized During Treatment: Gait belt  OT Visit Diagnosis: Unsteadiness on feet (R26.81);Other abnormalities of gait and mobility (R26.89);Muscle weakness (generalized) (M62.81);Feeding difficulties (R63.3);Other symptoms and signs involving the nervous system (R29.898);Other symptoms and signs involving cognitive function;Cognitive communication deficit (R41.841)   Activity Tolerance Patient limited by fatigue   Patient Left in bed;with call bell/phone within reach;with bed alarm set;with restraints reapplied;with family/visitor present   Nurse Communication Mobility status;Precautions        Time: 1010-1034 OT Time Calculation (min): 24 min  Charges: OT General Charges $OT Visit: 1 Procedure OT Treatments $Therapeutic Activity: 8-22 mins   Justin Ball, Justin Ball   OTR/L Pager: [phone] Office: [phone] .    Justin Ball, Justin Ball 06/04/2017, 10:54 AM

## 2017-06-04 NOTE — Progress Notes (Signed)
Subjective: Ventriculostomy was removed on Friday after clamping on Thursday. He was transferred to stepdown on Friday. As of Neurology note on Friday, he was talking more and able to eat some of his breakfast, but had poor attention and was restless. Most recent Neurology follow up note from Saturday documents that his speech became clearer over the course of the night. Some CSF leakage from his ventriculostomy incision was noted at that time; he was somnolent and did not participate in the neurology examination. Today, CSF leakage from the incision is again noted.    Objective: Current vital signs: BP 107/76 (BP Location: Right Arm)   Pulse (!) 108   Temp 98.2 F (36.8 C) (Oral)   Resp (!) 23   Ht '6\' 1"'  (1.854 m)   Wt 68.1 kg (150 lb 2.1 oz)   SpO2 100%   BMI 19.81 kg/m  Vital signs in last 24 hours: Temp:  [97.5 F (36.4 C)-99.1 F (37.3 C)] 98.2 F (36.8 C) (06/11 1130) Pulse Rate:  [97-109] 108 (06/11 0745) Resp:  [16-23] 23 (06/10 2003) BP: (107-131)/(72-94) 107/76 (06/11 0745) SpO2:  [99 %-100 %] 100 % (06/11 0745) Weight:  [68.1 kg (150 lb 2.1 oz)] 68.1 kg (150 lb 2.1 oz) (06/11 0538)  Intake/Output from previous day: 06/10 0701 - 06/11 0700 In: 5340.5 [P.O.:180; I.V.:1890.5; NG/GT:1950; IV Piggyback:1320] Out: 5800 [Urine:5800] Intake/Output this shift: Total I/O In: 500 [I.V.:200; NG/GT:300] Out: 1400 [Urine:1400] Nutritional status: DIET DYS 2 Room service appropriate? Yes; Fluid consistency: Thin  General Examination: General: Mildly cachectic AA male in NAD.  HEENT: Active leakage of clear CSF from ventriculostomy incision site is noted.  Skin: Noncyanotic   Neurologic Exam: Ment: In bed with eyes open but not responding to questions or commands. Briefly becomes more alert following noxious stimulus, but responds only with "ouch" and intermittent groaning.  CN: Pupils sluggishly reactive. Mild exotropia noted. Does not blink to threat, track or initiate  saccades towards stimuli. Face flaccidly symmetric.  Motor/Sensory: Decreased muscle bulk x 4. Moves upper extremities to noxious stimuli, right more than left. 4/5 withdrawal of bilateral lower extremities to noxious, without asymmetry.  Reflexes: Upper extremities 3+ bilaterally. Right patella 4+, left patella 3+.  Cerebellar/Gait: Unable to assess.   Lab Results: Results for orders placed or performed during the hospital encounter of 05/20/17 (from the past 48 hour(s))  Glucose, capillary     Status: None   Collection Time: 06/02/17 12:45 PM  Result Value Ref Range   Glucose-Capillary 81 65 - 99 mg/dL   Comment 1 Notify RN    Comment 2 Document in Chart   Glucose, capillary     Status: Abnormal   Collection Time: 06/02/17  5:17 PM  Result Value Ref Range   Glucose-Capillary 104 (H) 65 - 99 mg/dL   Comment 1 Notify RN    Comment 2 Document in Chart   Glucose, capillary     Status: Abnormal   Collection Time: 06/02/17  7:41 PM  Result Value Ref Range   Glucose-Capillary 101 (H) 65 - 99 mg/dL   Comment 1 Notify RN   Glucose, capillary     Status: Abnormal   Collection Time: 06/02/17 10:53 PM  Result Value Ref Range   Glucose-Capillary 102 (H) 65 - 99 mg/dL   Comment 1 Notify RN   Glucose, capillary     Status: None   Collection Time: 06/03/17  3:14 AM  Result Value Ref Range   Glucose-Capillary 83 65 - 99 mg/dL  Comment 1 Notify RN   Magnesium     Status: Abnormal   Collection Time: 06/03/17  4:04 AM  Result Value Ref Range   Magnesium 1.3 (L) 1.7 - 2.4 mg/dL  CBC with Differential/Platelet     Status: Abnormal   Collection Time: 06/03/17  4:04 AM  Result Value Ref Range   WBC 2.0 (L) 4.0 - 10.5 K/uL   RBC 3.17 (L) 4.22 - 5.81 MIL/uL   Hemoglobin 9.3 (L) 13.0 - 17.0 g/dL   HCT 28.7 (L) 39.0 - 52.0 %   MCV 90.5 78.0 - 100.0 fL   MCH 29.3 26.0 - 34.0 pg   MCHC 32.4 30.0 - 36.0 g/dL   RDW 13.7 11.5 - 15.5 %   Platelets 371 150 - 400 K/uL   Neutrophils Relative % 62 %    Neutro Abs 1.3 (L) 1.7 - 7.7 K/uL   Lymphocytes Relative 18 %   Lymphs Abs 0.4 (L) 0.7 - 4.0 K/uL   Monocytes Relative 13 %   Monocytes Absolute 0.3 0.1 - 1.0 K/uL   Eosinophils Relative 5 %   Eosinophils Absolute 0.1 0.0 - 0.7 K/uL   Basophils Relative 2 %   Basophils Absolute 0.0 0.0 - 0.1 K/uL  Renal function panel     Status: Abnormal   Collection Time: 06/03/17  4:04 AM  Result Value Ref Range   Sodium 135 135 - 145 mmol/L   Potassium 4.1 3.5 - 5.1 mmol/L   Chloride 107 101 - 111 mmol/L   CO2 23 22 - 32 mmol/L   Glucose, Bld 131 (H) 65 - 99 mg/dL   BUN 11 6 - 20 mg/dL   Creatinine, Ser 0.62 0.61 - 1.24 mg/dL   Calcium 8.6 (L) 8.9 - 10.3 mg/dL   Phosphorus 5.1 (H) 2.5 - 4.6 mg/dL   Albumin 2.4 (L) 3.5 - 5.0 g/dL   GFR calc non Af Amer >60 >60 mL/min   GFR calc Af Amer >60 >60 mL/min    Comment: (NOTE) The eGFR has been calculated using the CKD EPI equation. This calculation has not been validated in all clinical situations. eGFR's persistently <60 mL/min signify possible Chronic Kidney Disease.    Anion gap 5 5 - 15  Comprehensive metabolic panel     Status: Abnormal   Collection Time: 06/03/17  4:04 AM  Result Value Ref Range   Sodium 135 135 - 145 mmol/L   Potassium 4.1 3.5 - 5.1 mmol/L   Chloride 107 101 - 111 mmol/L   CO2 23 22 - 32 mmol/L   Glucose, Bld 132 (H) 65 - 99 mg/dL   BUN 11 6 - 20 mg/dL   Creatinine, Ser 0.62 0.61 - 1.24 mg/dL   Calcium 8.7 (L) 8.9 - 10.3 mg/dL   Total Protein 7.5 6.5 - 8.1 g/dL   Albumin 2.3 (L) 3.5 - 5.0 g/dL   AST 73 (H) 15 - 41 U/L   ALT 81 (H) 17 - 63 U/L   Alkaline Phosphatase 183 (H) 38 - 126 U/L   Total Bilirubin 0.3 0.3 - 1.2 mg/dL   GFR calc non Af Amer >60 >60 mL/min   GFR calc Af Amer >60 >60 mL/min    Comment: (NOTE) The eGFR has been calculated using the CKD EPI equation. This calculation has not been validated in all clinical situations. eGFR's persistently <60 mL/min signify possible Chronic Kidney Disease.     Anion gap 5 5 - 15  Glucose, capillary  Status: None   Collection Time: 06/03/17  8:23 AM  Result Value Ref Range   Glucose-Capillary 84 65 - 99 mg/dL   Comment 1 Notify RN    Comment 2 Document in Chart   Glucose, capillary     Status: None   Collection Time: 06/03/17 12:31 PM  Result Value Ref Range   Glucose-Capillary 85 65 - 99 mg/dL   Comment 1 Notify RN    Comment 2 Document in Chart   Glucose, capillary     Status: None   Collection Time: 06/03/17  3:57 PM  Result Value Ref Range   Glucose-Capillary 90 65 - 99 mg/dL   Comment 1 Notify RN    Comment 2 Document in Chart   Glucose, capillary     Status: Abnormal   Collection Time: 06/03/17  8:07 PM  Result Value Ref Range   Glucose-Capillary 101 (H) 65 - 99 mg/dL  Glucose, capillary     Status: None   Collection Time: 06/03/17 11:06 PM  Result Value Ref Range   Glucose-Capillary 94 65 - 99 mg/dL  Glucose, capillary     Status: None   Collection Time: 06/04/17  3:11 AM  Result Value Ref Range   Glucose-Capillary 89 65 - 99 mg/dL  Magnesium     Status: Abnormal   Collection Time: 06/04/17  5:16 AM  Result Value Ref Range   Magnesium 1.6 (L) 1.7 - 2.4 mg/dL  CBC with Differential/Platelet     Status: Abnormal   Collection Time: 06/04/17  5:16 AM  Result Value Ref Range   WBC 2.1 (L) 4.0 - 10.5 K/uL   RBC 3.36 (L) 4.22 - 5.81 MIL/uL   Hemoglobin 10.4 (L) 13.0 - 17.0 g/dL   HCT 30.7 (L) 39.0 - 52.0 %   MCV 91.4 78.0 - 100.0 fL   MCH 31.0 26.0 - 34.0 pg   MCHC 33.9 30.0 - 36.0 g/dL   RDW 14.1 11.5 - 15.5 %   Platelets 352 150 - 400 K/uL   Neutrophils Relative % 56 %   Lymphocytes Relative 21 %   Monocytes Relative 7 %   Eosinophils Relative 13 %   Basophils Relative 3 %   Neutro Abs 1.2 (L) 1.7 - 7.7 K/uL   Lymphs Abs 0.4 (L) 0.7 - 4.0 K/uL   Monocytes Absolute 0.1 0.1 - 1.0 K/uL   Eosinophils Absolute 0.3 0.0 - 0.7 K/uL   Basophils Absolute 0.1 0.0 - 0.1 K/uL   RBC Morphology ELLIPTOCYTES   Comprehensive  metabolic panel     Status: Abnormal   Collection Time: 06/04/17  5:16 AM  Result Value Ref Range   Sodium 135 135 - 145 mmol/L   Potassium 3.9 3.5 - 5.1 mmol/L   Chloride 105 101 - 111 mmol/L   CO2 26 22 - 32 mmol/L   Glucose, Bld 98 65 - 99 mg/dL   BUN 15 6 - 20 mg/dL   Creatinine, Ser 0.65 0.61 - 1.24 mg/dL   Calcium 8.9 8.9 - 10.3 mg/dL   Total Protein 7.6 6.5 - 8.1 g/dL   Albumin 2.5 (L) 3.5 - 5.0 g/dL   AST 81 (H) 15 - 41 U/L   ALT 90 (H) 17 - 63 U/L   Alkaline Phosphatase 190 (H) 38 - 126 U/L   Total Bilirubin 0.4 0.3 - 1.2 mg/dL   GFR calc non Af Amer >60 >60 mL/min   GFR calc Af Amer >60 >60 mL/min    Comment: (NOTE) The  eGFR has been calculated using the CKD EPI equation. This calculation has not been validated in all clinical situations. eGFR's persistently <60 mL/min signify possible Chronic Kidney Disease.    Anion gap 4 (L) 5 - 15  Glucose, capillary     Status: None   Collection Time: 06/04/17  7:39 AM  Result Value Ref Range   Glucose-Capillary 77 65 - 99 mg/dL    Recent Results (from the past 240 hour(s))  CSF culture with Stat gram stain     Status: None (Preliminary result)   Collection Time: 05/29/17 12:19 PM  Result Value Ref Range Status   Specimen Description CSF  Final   Special Requests NONE  Final   Gram Stain   Final    NO WBC SEEN YEAST CORRECTED RESULTS PREVIOUSLY REPORTED AS: NO ORGANISMS SEEN CORRECTED RESULTS CALLED TO: M HARPER,RN AT 1525 05/29/17 BY L BENFIELD    Culture   Final    FEW STAPHYLOCOCCUS EPIDERMIDIS CRITICAL RESULT CALLED TO, READ BACK BY AND VERIFIED WITH: DR Baxter Flattery 05/30/17 @ 61 M VESTAL CONTINUING TO HOLD    Report Status PENDING  Incomplete   Organism ID, Bacteria STAPHYLOCOCCUS EPIDERMIDIS  Final      Susceptibility   Staphylococcus epidermidis - MIC*    CIPROFLOXACIN <=0.5 SENSITIVE Sensitive     ERYTHROMYCIN >=8 RESISTANT Resistant     GENTAMICIN <=0.5 SENSITIVE Sensitive     OXACILLIN >=4 RESISTANT Resistant      TETRACYCLINE 2 SENSITIVE Sensitive     VANCOMYCIN 2 SENSITIVE Sensitive     TRIMETH/SULFA >=320 RESISTANT Resistant     CLINDAMYCIN >=8 RESISTANT Resistant     RIFAMPIN <=0.5 SENSITIVE Sensitive     Inducible Clindamycin NEGATIVE Sensitive     * FEW STAPHYLOCOCCUS EPIDERMIDIS    Lipid Panel No results for input(s): CHOL, TRIG, HDL, CHOLHDL, VLDL, LDLCALC in the last 72 hours.  Studies/Results: No results found.  Medications:  Scheduled: . acetaminophen  650 mg Oral Q24H  . azithromycin  1,200 mg Per Tube Weekly  . chlorhexidine  15 mL Mouth Rinse BID  . Chlorhexidine Gluconate Cloth  6 each Topical Daily  . diphenhydrAMINE  25 mg Oral Q24H  . flucytosine  1,500 mg Per Tube Q6H  . insulin aspart  2-6 Units Subcutaneous Q4H  . mouth rinse  15 mL Mouth Rinse q12n4p  . metoprolol tartrate  5 mg Intravenous Q8H  . potassium chloride  40 mEq Oral Daily  . sulfamethoxazole-trimethoprim  10 mL Per Tube Daily   Continuous: . sodium chloride 250 mL (06/03/17 1803)  . amphotericin  B  Liposome (AMBISOME) ADULT IV Stopped (06/03/17 2003)  . dextrose 5 % and 0.45% NaCl 50 mL/hr at 06/04/17 1100  . feeding supplement (VITAL AF 1.2 CAL) 1,000 mL (06/04/17 1100)  . levETIRAcetam Stopped (06/04/17 1015)  . linezolid (ZYVOX) IV Stopped (06/04/17 1054)  . magnesium sulfate 1 - 4 g bolus IVPB 6 g (06/04/17 1000)  . sodium chloride    . sodium chloride     QJJ:HERDEY chloride, acetaminophen, albuterol, LORazepam, metoprolol tartrate, ondansetron (ZOFRAN) IV, sodium chloride flush  Assessment/Plan: 1. Cryptococcal meningoencephalitis. Per ID, he is to continue induction therapy with amphotericin and flucytosine. Given yeast on gram stain from recent CSF, ID has recommended that a repeat CSF sample be obtained this week.  2. 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 MRI revealed. multifocal cortical  diffusion restriction involving both cerebellar hemispheres and  the bilateral temporal, occipital and frontal lobes, as well as the deep gray nuclei. Also noted were symmetric regions of restricted diffusion within the hippocampi - this latter finding is also suspicious for possible subclinical status epilepticus in addition to the observable seizures, occurring prior to the MRI scan.  EEG on 5/28 showed diffuse slowing without epileptiform discharges. 3. Encephalophathy. Secondary to #1 and #2, above. 4. Seizures. Have not recurred since initial presentation. Most likely secondary to #1, above. Continue Keppra at 1000 mg BID. Can consider a slow taper with close monitoring after additional neurological improvement has occurred.  5. Culture-positive staph epi meningitis, lInezolid sensitive. Per ID, will need 14 days total antibiotic treatment.  6. HIV/AIDS. ID is following. Antiretroviral therapy is on hold.  7. Persistent CSF leak from ventriculostomy incision site. Suspect possible increasing ICP, which could account for his gradually worsened mentation on serial documented Neurological examinations, including today's examination. Will contact Neurosurgery.     LOS: 15 days   '@Electronically'  signed: Dr. Kerney Elbe 06/04/2017  11:42 AM

## 2017-06-04 NOTE — Progress Notes (Signed)
I left a voicemail for pt's grandmother to contact me to begin discussions concerning his eventual rehab disposition. I await that discussion before pursuing insurance authorization for a possible inpt rehab admission.  [phone]

## 2017-06-04 NOTE — Progress Notes (Signed)
PROGRESS NOTE    Justin Ball  WUJ:[id number] DOB: 01-31-1991 DOA: 05/20/2017 PCP: Randall Hiss, MD     Brief Narrative:  Justin Ball is a 26 y.o. male with a history of AIDS, noncompliance, syphilis, bipolar disorder who presented on 5/27 after being pulled from his car unresponsive with concern for new-onset seizures, postictal on arrival. He had been seen in ED 3 days prior with headaches, and discharged afebrile, well-appearing with negative neuroimaging. He was admitted to the ICU, intubated. LP performed demonstrated yeast and he was treated with amphotericin and flucytosine, in addition to vancomycin and zosyn, for cryptococcal meningitis. EEG showed PLEDs, keppra started. The day after admission the patient demonstrated a fixed right pupil and posturing. Mannitol was started, CT head negative, and ventriculostomy was placed by neurosurgery due to concern for herniation. Blood cultures grew positive with S. pneumoniae. CSF culture later grew S. epidermidis and linezolid was added on 6/6. He was extubated 6/2 and mental status is noted to still be altered from baseline. The patient was transferred to SDU and weaned from precedex on 6/8 to the Hospitalists service 6/9. Neurology, neurosurgery, and ID are consulting, and plan is for discharge to CIR once patient is stable and able to tolerate 3 hours of therapy daily.    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  - Continue amphotericin and flucytosine per  ID - Continue linezolid x14 days (6/6 - last dose on 6/19) - Continue keppra per neurology for seizure Tx - Repeat LP at some point this week, will defer to ID  - EVD removed 6/8. Ventriculostomy site leaking intermittently, neurosurgery following.   Acute encephalopathy: Multifactorial with meningoencephalitis, seizures, ischemic infarcts (no antiplatelet per neuro), metabolic derangements and medications; likely complicated by delirium. - Numerous scattered areas of ischemia on MRI - Treat underlying conditions. - Ativan prn agitation, off precedex since 6/8 - Avoid haldol with seizure disorder  - SLP evaluating, giving dysphagia 2 diet in addition to TF's   Streptococcus pneumoniae bacteremia - Linezolid per ID x14 days (6/6 - last dose on 6/19) - TTE ordered: No vegetations noted  Bordatella pneumonia - Was treated with doxycycline per ID, now off   AIDS: CD4 count April 2018 was 2.  - Hold ART per ID - Continue bactrim daily and weekly azithromycin (given 6/7) ppx  Neutropenia: Suspected due to flucytosine.  - Monitor.  Diarrhea: Acute, likely from tube feeds.  - Flexiseal - Anti-diarrheals ok  Chronic hepatitis B - Follow up with ID   DVT prophylaxis: SCDs Code Status: Full Family Communication: None at bedside Disposition Plan: Eventual DC to CIR once able to tolerate therapy for 3 hours daily    Consultants:   PCCM primary 5/27 - 6/8  Neurology  Neurosurgery  ID  Procedures:   OETT 5/27 - 6/3  OGT 5/27 - 6/3  NGT >>>   Right ventriculostomy 5/28 >>> 6/8  LUE TL PICC 6/3 >>>  Foley 6/4 >>>  Flexiseal 6/7  Echocardiogram 06/02/2017: - Left  ventricle: The cavity size was normal. Wall thickness was normal. Systolic function was vigorous. The estimated ejection fraction was in the range of 65% to 70%. Left ventricular diastolic function parameters were normal. - Atrial septum: No defect or patent foramen ovale was  identified.  Impressions: - No cardiac source of emboli was indentified.  Antimicrobials:   Blood Cultures x2 5/27: 1 of 2 +Streptococcus pneumoniae   Blood Fungal Culture 5/27: Negative   CSF Culture 5/27: Cryptococcus neoformans   Tracheal Aspirate Culture 5/28: MODERATE BORDETELLA BRONCHISEPTICA   Blood Cultures x2 5/29: Negative   Gastrointestinal PCR 5/31: Campylobacter / E coli (shiga-like toxin producing) / Giardia lamblia / Sapovirus   CSF Culture 6/5: Yeast & few S. epidermidis  Ambisome 5/27 >>> Flucytosine 5/27 >>> Rocephin 5/27 >>> Bactrim 5/27 >>> Azithromycin qweek 5/31 >>> Doxycycline 6/2 >>> Linezolid 6/6>>>  Subjective: Alert to voice but does not answer questions   Objective: Vitals:   06/03/17 2003 06/04/17 0400 06/04/17 0538 06/04/17 0745  BP: (!) 131/94   107/76  Pulse: (!) 109 97  (!) 108  Resp: (!) 23     Temp: 98 F (36.7 C) 99.1 F (37.3 C)  98.2 F (36.8 C)  TempSrc: Axillary   Oral  SpO2: 100% 100%  100%  Weight:   68.1 kg (150 lb 2.1 oz)   Height:        Intake/Output Summary (Last 24 hours) at 06/04/17 1128 Last data filed at 06/04/17 0747  Gross per 24 hour  Intake           3225.5 ml  Output             5400 ml  Net          -2174.5 ml   Filed Weights   06/02/17 0340 06/03/17 0455 06/04/17 0538  Weight: 67.1 kg (147 lb 14.9 oz) 68.4 kg (150 lb 12.7 oz) 68.1 kg (150 lb 2.1 oz)    Examination:  General exam: Appears calm and comfortable, resting comfortably  Respiratory system: Clear to auscultation. Respiratory effort normal. Cardiovascular system: S1 & S2 heard, RRR. No JVD, murmurs, rubs, gallops or clicks. No pedal edema. Gastrointestinal system: Abdomen is nondistended, soft and nontender. No organomegaly or masses felt. Normal bowel sounds heard. +NG in place, +Flexiseal in place  Central nervous system: Alert to voice but does not follow commands or answer questions, pupils equal and react to light  bilat Extremities: Symmetric in appearance Skin: No rashes, lesions or ulcers  Data Reviewed: I have personally reviewed following labs and imaging studies  CBC:  Recent Labs Lab 05/31/17 0255 06/01/17 0316 06/02/17 0537 06/03/17 0404 06/04/17 0516  WBC 2.5* 1.8* 2.7* 2.0* 2.1*  NEUTROABS 1.8 1.3* 1.8 1.3* 1.2*  HGB 10.1* 10.2* 9.9* 9.3* 10.4*  HCT 30.6* 32.1* 30.3* 28.7* 30.7*  MCV 89.7 90.7 89.6 90.5 91.4  PLT 301 327 368 371 352   Basic Metabolic Panel:  Recent Labs Lab 05/30/17 0600  05/31/17 0255 05/31/17 0508 05/31/17 0801 06/01/17 0316 06/02/17 0537 06/03/17 0404 06/04/17 0516  NA 131*  --   --  127* 132* 131* 132* 135  135 135  K 4.0  --   --  5.8* 4.0 4.0 4.1 4.1  4.1 3.9  CL 100*  --   --  103 99* 106 106 107  107 105  CO2 21*  --   --  21* 23 20* 21* 23  23 26   GLUCOSE 107*  --   --  480* 169* 104* 136* 132*  131* 98  BUN 24*  --   --  17 18 14 10 11  11 15   CREATININE 0.68  --   --  0.61 0.63 0.54* 0.56* 0.62  0.62 0.65  CALCIUM 9.0  --   --  7.7* 8.5* 8.5* 8.7* 8.7*  8.6* 8.9  MG 1.2*  < > 2.2  --   --  1.3* 1.4* 1.3* 1.6*  PHOS 4.8*  --   --  3.7  --  5.1* 4.3 5.1*  --   < > = values in this interval not displayed. GFR: Estimated Creatinine Clearance: 134.8 mL/min (by C-G formula based on SCr of 0.65 mg/dL). Liver Function Tests:  Recent Labs Lab 05/31/17 0801 06/01/17 0316 06/02/17 0537 06/03/17 0404 06/04/17 0516  AST 93*  --   --  73* 81*  ALT 95*  --   --  81* 90*  ALKPHOS 223*  --   --  183* 190*  BILITOT 0.7  --   --  0.3 0.4  PROT 8.5*  --   --  7.5 7.6  ALBUMIN 2.4* 2.2* 2.3* 2.3*  2.4* 2.5*   No results for input(s): LIPASE, AMYLASE in the last 168 hours. No results for input(s): AMMONIA in the last 168 hours. Coagulation Profile: No results for input(s): INR, PROTIME in the last 168 hours. Cardiac Enzymes: No results for input(s): CKTOTAL, CKMB, CKMBINDEX, TROPONINI in the last 168 hours. BNP (last 3 results) No  results for input(s): PROBNP in the last 8760 hours. HbA1C: No results for input(s): HGBA1C in the last 72 hours. CBG:  Recent Labs Lab 06/03/17 1557 06/03/17 2007 06/03/17 2306 06/04/17 0311 06/04/17 0739  GLUCAP 90 101* 94 89 77   Lipid Profile: No results for input(s): CHOL, HDL, LDLCALC, TRIG, CHOLHDL, LDLDIRECT in the last 72 hours. Thyroid Function Tests: No results for input(s): TSH, T4TOTAL, FREET4, T3FREE, THYROIDAB in the last 72 hours. Anemia Panel: No results for input(s): VITAMINB12, FOLATE, FERRITIN, TIBC, IRON, RETICCTPCT in the last 72 hours. Sepsis Labs: No results for input(s): PROCALCITON, LATICACIDVEN in the last 168 hours.  Recent Results (from the past 240 hour(s))  CSF culture with Stat gram stain     Status: None (Preliminary result)   Collection Time: 05/29/17 12:19 PM  Result Value Ref Range Status   Specimen Description CSF  Final   Special Requests NONE  Final   Gram Stain   Final    NO WBC SEEN YEAST CORRECTED RESULTS PREVIOUSLY REPORTED AS: NO ORGANISMS SEEN CORRECTED RESULTS CALLED TO: M HARPER,RN AT 1525 05/29/17 BY L BENFIELD    Culture   Final    FEW STAPHYLOCOCCUS EPIDERMIDIS CRITICAL RESULT CALLED TO, READ BACK BY AND VERIFIED WITH: DR Drue Second 05/30/17 @ 1119 M VESTAL CONTINUING TO HOLD    Report Status PENDING  Incomplete   Organism ID, Bacteria STAPHYLOCOCCUS EPIDERMIDIS  Final      Susceptibility   Staphylococcus epidermidis - MIC*    CIPROFLOXACIN <=0.5 SENSITIVE Sensitive     ERYTHROMYCIN >=8 RESISTANT Resistant     GENTAMICIN <=0.5 SENSITIVE Sensitive     OXACILLIN >=4 RESISTANT Resistant     TETRACYCLINE 2 SENSITIVE Sensitive     VANCOMYCIN 2 SENSITIVE Sensitive     TRIMETH/SULFA >=320 RESISTANT Resistant     CLINDAMYCIN >=8 RESISTANT Resistant     RIFAMPIN <=0.5 SENSITIVE Sensitive     Inducible Clindamycin NEGATIVE Sensitive     * FEW STAPHYLOCOCCUS EPIDERMIDIS  Radiology Studies: No results  found.    Scheduled Meds: . acetaminophen  650 mg Oral Q24H  . azithromycin  1,200 mg Per Tube Weekly  . chlorhexidine  15 mL Mouth Rinse BID  . Chlorhexidine Gluconate Cloth  6 each Topical Daily  . diphenhydrAMINE  25 mg Oral Q24H  . flucytosine  1,500 mg Per Tube Q6H  . insulin aspart  2-6 Units Subcutaneous Q4H  . mouth rinse  15 mL Mouth Rinse q12n4p  . metoprolol tartrate  5 mg Intravenous Q8H  . potassium chloride  40 mEq Oral Daily  . sulfamethoxazole-trimethoprim  10 mL Per Tube Daily   Continuous Infusions: . sodium chloride 250 mL (06/03/17 1803)  . amphotericin  B  Liposome (AMBISOME) ADULT IV Stopped (06/03/17 2003)  . dextrose 5 % and 0.45% NaCl 50 mL/hr at 06/03/17 1803  . feeding supplement (VITAL AF 1.2 CAL) 1,000 mL (06/03/17 2058)  . levETIRAcetam Stopped (06/04/17 1015)  . linezolid (ZYVOX) IV Stopped (06/04/17 1054)  . magnesium sulfate 1 - 4 g bolus IVPB 6 g (06/04/17 1000)  . sodium chloride    . sodium chloride       LOS: 15 days    Time spent: 45 minutes   Noralee Stain, DO [facility] [email or web address] Password [facility] 06/04/2017, 11:28 AM

## 2017-06-04 NOTE — Care Management Note (Addendum)
Case Management Note  Patient Details  Name: Justin SpurrLinston R Bruington MRN: [id number] Date of Birth: 10/29/1991  Subjective/Objective:   From home with grandmother,Transferred to SDU, presents with seizures , AMS, Cryptococcal meningitis and strep bacteremia, 042,  Extubated 6/2.  Ventriculostomy removed on 6/8, conts with CSF drainage from site with worsening mentation,suspect ICP, LP to assess ICP, if elevated recommend seral lps's and will schedule for shunt placement this week if necessary per neurosurgery.   per pt eval rec CIR, but patient is plus 2 assist, disoriented x 4, sat at EOB and stood with physical therapy, HR up to 136.    6/14 1509 Letha Capeeborah Jahaan Vanwagner RN, BSN - will plan for VP shunt Monday.  6/15 1412 Letha Capeeborah Eliese Kerwood RN, BSN- LP today for ICP and declining mental status.    6/18 1007 Letha Capeeborah Adolf Ormiston RN, BSN - per neurosurgery plan for VP Shunt tomorrow. Patient's insurance will term on 6/25,  Financial counselor notified for  Disability and medicaid applications per Britta MccreedyBarbara B  And emergency ADAP for future need of 042 meds.    6/20 1042 Letha Capeeborah Claud Gowan RN, BSN- POD 1 VP Shunt, NPO, restraints, ( trial with ankles out of restraints today), has picc line and flexiseal.      PCP Dr. Paulette Blanchornelius Van [place]                     Action/Plan: Patient insurance will term on 6/25, financial counselor Fernande BoydenAdam Byrd notified regarding disability and Medicaid applications  Per Britta MccreedyBarbara with CIR, and Dwain SarnaMitch McGee [phone] working on emergency ADAP for future need for 042 meds but patient will have to be alert enough to sign, right now he is not at that point.  Expected Discharge Date:                  Expected Discharge Plan:     In-House Referral:  Chaplain  Discharge planning Services  CM Consult  Post Acute Care Choice:    Choice offered to:     DME Arranged:    DME Agency:     HH Arranged:    HH Agency:     Status of Service:  In process, will continue to follow  If discussed at Long Length of  Stay Meetings, dates discussed:    Additional Comments:  Leone Havenaylor, Norwood Quezada [place], RN 06/04/2017, 9:16 AM

## 2017-06-04 NOTE — Progress Notes (Signed)
Repeat Head CT IMPRESSION: Interval removal of right frontal ventricular catheter. No hydrocephalus or fluid collection identified. No acute hemorrhage  Repeat head CT without evidence of hydrocephalus. I have discussed with Dr Conchita ParisNundkumar Rec LP to assess ICP. If elevated, rec continued serial LPs.  Will schedule for shunt placement this week if necessary

## 2017-06-04 NOTE — Progress Notes (Signed)
OT NOTE  Mother present at therapy session:  Name Justin Ball  # [phone]  Mother reports : "me and grandmother are next relatives to help him"   Justin Ball, Justin Ball   OTR/L Pager: [phone] Office: [phone] .

## 2017-06-04 NOTE — Progress Notes (Signed)
Pt noted to be more somnolent today, not participating in therapy. Some drainage of CSF from ventriculostomy site again noted. Head CT reviewed, does not show hydrocephalus or any evidence of herniation. Symptoms could suggest intracranial hypertension which could be confirmed with LP. If this is the case, he will likely need more permanent ventriculoperitoneal shunt. At this point I would not replace EVD as his symptoms should be able to be managed with serial LP. If pressures are elevated, would continue serial LPs and will plan on placement of VPS later this week, assuming this is feasible from an ID standpoint.

## 2017-06-05 ENCOUNTER — Inpatient Hospital Stay (HOSPITAL_COMMUNITY): Payer: No Typology Code available for payment source

## 2017-06-05 LAB — CSF CELL COUNT WITH DIFFERENTIAL
Lymphs, CSF: 93 % — ABNORMAL HIGH (ref 40–80)
Monocyte-Macrophage-Spinal Fluid: 7 % — ABNORMAL LOW (ref 15–45)
RBC Count, CSF: 4 /mm3 — ABNORMAL HIGH
TUBE #: 2
WBC, CSF: 10 /mm3 — ABNORMAL HIGH (ref 0–5)

## 2017-06-05 LAB — GLUCOSE, CAPILLARY
GLUCOSE-CAPILLARY: 84 mg/dL (ref 65–99)
GLUCOSE-CAPILLARY: 96 mg/dL (ref 65–99)
Glucose-Capillary: 67 mg/dL (ref 65–99)
Glucose-Capillary: 81 mg/dL (ref 65–99)
Glucose-Capillary: 83 mg/dL (ref 65–99)
Glucose-Capillary: 84 mg/dL (ref 65–99)
Glucose-Capillary: 88 mg/dL (ref 65–99)
Glucose-Capillary: 90 mg/dL (ref 65–99)

## 2017-06-05 LAB — CBC WITH DIFFERENTIAL/PLATELET
BASOS ABS: 0 10*3/uL (ref 0.0–0.1)
Basophils Relative: 2 %
Eosinophils Absolute: 0.2 10*3/uL (ref 0.0–0.7)
Eosinophils Relative: 11 %
HEMATOCRIT: 30.2 % — AB (ref 39.0–52.0)
Hemoglobin: 9.6 g/dL — ABNORMAL LOW (ref 13.0–17.0)
LYMPHS ABS: 0.4 10*3/uL — AB (ref 0.7–4.0)
Lymphocytes Relative: 23 %
MCH: 29.1 pg (ref 26.0–34.0)
MCHC: 31.8 g/dL (ref 30.0–36.0)
MCV: 91.5 fL (ref 78.0–100.0)
MONOS PCT: 7 %
Monocytes Absolute: 0.1 10*3/uL (ref 0.1–1.0)
NEUTROS ABS: 1.2 10*3/uL — AB (ref 1.7–7.7)
Neutrophils Relative %: 57 %
Platelets: 330 10*3/uL (ref 150–400)
RBC: 3.3 MIL/uL — ABNORMAL LOW (ref 4.22–5.81)
RDW: 14.2 % (ref 11.5–15.5)
WBC: 1.9 10*3/uL — ABNORMAL LOW (ref 4.0–10.5)

## 2017-06-05 LAB — PROTEIN AND GLUCOSE, CSF
Glucose, CSF: 34 mg/dL — ABNORMAL LOW (ref 40–70)
Total  Protein, CSF: 156 mg/dL — ABNORMAL HIGH (ref 15–45)

## 2017-06-05 LAB — RENAL FUNCTION PANEL
Albumin: 2.6 g/dL — ABNORMAL LOW (ref 3.5–5.0)
Anion gap: 5 (ref 5–15)
BUN: 14 mg/dL (ref 6–20)
CHLORIDE: 106 mmol/L (ref 101–111)
CO2: 22 mmol/L (ref 22–32)
Calcium: 8.8 mg/dL — ABNORMAL LOW (ref 8.9–10.3)
Creatinine, Ser: 0.65 mg/dL (ref 0.61–1.24)
GFR calc Af Amer: >60 mL/min (ref >60)
GFR calc non Af Amer: >60 mL/min (ref >60)
GLUCOSE: 94 mg/dL (ref 65–99)
POTASSIUM: 4.3 mmol/L (ref 3.5–5.1)
Phosphorus: 6 mg/dL — ABNORMAL HIGH (ref 2.5–4.6)
Sodium: 133 mmol/L — ABNORMAL LOW (ref 135–145)

## 2017-06-05 LAB — CSF CULTURE: GRAM STAIN: NONE SEEN

## 2017-06-05 LAB — CSF CULTURE W GRAM STAIN

## 2017-06-05 LAB — MAGNESIUM: MAGNESIUM: 1.7 mg/dL (ref 1.7–2.4)

## 2017-06-05 MED ORDER — MAGNESIUM SULFATE 50 % IJ SOLN
6.0000 g | Freq: Once | INTRAMUSCULAR | Status: AC
  Administered 2017-06-05: 6 g via INTRAVENOUS
  Filled 2017-06-05: qty 12

## 2017-06-05 MED ORDER — PNEUMOCOCCAL VAC POLYVALENT 25 MCG/0.5ML IJ INJ
0.5000 mL | INJECTION | INTRAMUSCULAR | Status: AC
  Administered 2017-06-06: 0.5 mL via INTRAMUSCULAR
  Filled 2017-06-05: qty 0.5

## 2017-06-05 MED ORDER — LIDOCAINE HCL (PF) 1 % IJ SOLN: 10.0000 mL | Freq: Once | INTRAMUSCULAR | Status: DC

## 2017-06-05 MED ORDER — MAGNESIUM OXIDE 400 (241.3 MG) MG PO TABS
400.0000 mg | ORAL_TABLET | Freq: Two times a day (BID) | ORAL | Status: DC
  Administered 2017-06-05 – 2017-06-09 (×10): 400 mg
  Filled 2017-06-05 (×10): qty 1

## 2017-06-05 NOTE — Progress Notes (Signed)
PROGRESS NOTE    Justin Ball  ZOX:[id number] DOB: 12/31/1990 DOA: 05/20/2017 PCP: Randall HissVan Dam, [name], MD     Brief Narrative:  Justin Ball is a 26 y.o. male with a history of AIDS, noncompliance, syphilis, bipolar disorder who presented on 5/27 after being pulled from his car unresponsive with concern for new-onset seizures, postictal on arrival. He had been seen in ED 3 days prior with headaches, and discharged afebrile, well-appearing with negative neuroimaging. He was admitted to the ICU, intubated. LP performed demonstrated yeast and he was treated with amphotericin and flucytosine, in addition to vancomycin and zosyn, for cryptococcal meningitis. EEG showed PLEDs, keppra started. The day after admission the patient demonstrated a fixed right pupil and posturing. Mannitol was started, CT head negative, and ventriculostomy was placed by neurosurgery due to concern for herniation. Blood cultures grew positive with S. pneumoniae. CSF culture later grew S. epidermidis and linezolid was added on 6/6. He was extubated 6/2 and mental status is noted to still be altered from baseline. The patient was transferred to SDU and weaned from precedex on 6/8 to the Hospitalists service 6/9. Neurology, neurosurgery, and ID are consulting, and plan is for discharge to CIR once patient is stable and able to tolerate 3 hours of therapy daily.    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  -  Continue amphotericin and flucytosine per ID - Continue linezolid x14 days (6/6 - last dose on 6/19) - Continue keppra per neurology for seizure Tx - EVD removed 6/8  - LP repeated 6/12 with opening pressure 27. Plan for VPS placement   Acute encephalopathy: Multifactorial with meningoencephalitis, seizures, ischemic infarcts (no antiplatelet per neuro), metabolic derangements and medications; likely complicated by delirium. - Numerous scattered areas of ischemia on MRI - Treat underlying conditions. - Ativan prn agitation, off precedex since 6/8 - Avoid haldol with seizure disorder  - SLP evaluating, giving dysphagia 2 diet in addition to TF's  - Initial improvement after LP this morning, but somnolent on my exam   Streptococcus pneumoniae bacteremia - Linezolid per ID x14 days (6/6 - last dose on 6/19) - TTE ordered: No vegetations noted   Bordatella pneumonia - Was treated with doxycycline per ID, now off   AIDS: CD4 count April 2018 was 2.  - Hold ART per ID - Continue bactrim daily and weekly azithromycin (given 6/7) ppx  Neutropenia: Suspected due to flucytosine.  - Monitor.  Diarrhea: Acute, likely from tube feeds.  - Flexiseal - Anti-diarrheals ok  Chronic hepatitis B - Follow up with ID   DVT prophylaxis: SCDs Code Status: Full Family Communication: None at bedside Disposition Plan: Eventual DC to CIR once able to tolerate therapy for 3 hours daily    Consultants:   PCCM primary 5/27 - 6/8  Neurology  Neurosurgery  ID  Procedures:   OETT 5/27 - 6/3  OGT 5/27 - 6/3  NGT >>>   Right ventriculostomy 5/28 - 6/8  LUE TL PICC 6/3 >>>  Foley 6/4 >>>  Flexiseal 6/7  LP 6/12  Echocardiogram 06/02/2017: - Left ventricle: The cavity size was normal. Wall thickness was normal. Systolic function was vigorous. The estimated ejection fraction was in the range of 65% to 70%. Left ventricular diastolic function parameters were normal. -  Atrial septum: No defect or patent foramen ovale was identified.  Impressions: - No cardiac source of emboli was indentified.  Antimicrobials:   Blood Cultures x2 5/27: 1 of 2 +Streptococcus pneumoniae   Blood Fungal Culture 5/27: Negative   CSF Culture 5/27: Cryptococcus neoformans   Tracheal Aspirate Culture 5/28: MODERATE BORDETELLA BRONCHISEPTICA   Blood Cultures x2 5/29: Negative   Gastrointestinal PCR 5/31: Campylobacter / E coli (shiga-like toxin producing) / Giardia lamblia / Sapovirus   CSF Culture 6/5: Yeast & few S. Epidermidis   Ambisome 5/27 >>> Flucytosine 5/27 >>> Rocephin 5/27 >>> Bactrim 5/27 >>> Azithromycin qweek 5/31 >>> Doxycycline 6/2 >>> Linezolid 6/6>>>  Subjective: Alert to voice but does not answer questions, does not stay awake  Objective: Vitals:   06/05/17 0335 06/05/17 0505 06/05/17 0700 06/05/17 1150  BP: 103/66  107/73 117/71  Pulse: 88  (!) 115 (!) 102  Resp: 14  16 19   Temp: 99 F (37.2 C)  97.2 F (36.2 C) 98.2 F (36.8 C)  TempSrc: Axillary  Axillary Oral  SpO2: 100%  100% 100%  Weight:  66.4 kg (146 lb 6.2 oz)    Height:        Intake/Output Summary (Last 24 hours) at 06/05/17 1408 Last data filed at 06/05/17 1300  Gross per 24 hour  Intake           6404.5 ml  Output             5600 ml  Net            804.5 ml   Filed Weights   06/03/17 0455 06/04/17 0538 06/05/17 0505  Weight: 68.4 kg (150 lb 12.7 oz) 68.1 kg (150 lb 2.1 oz) 66.4 kg (146 lb 6.2 oz)    Examination:  General exam: Appears calm and comfortable, resting comfortably after LP Respiratory system: Clear to auscultation. Respiratory effort normal. Cardiovascular system: S1 & S2 heard, RRR. No JVD, murmurs, rubs, gallops or clicks. No pedal edema. Gastrointestinal system: Abdomen is nondistended, soft and nontender. No organomegaly or masses felt. Normal bowel sounds heard. +NG in place, +Flexiseal in place  Central nervous system: Alert to voice  but does not follow commands or answer questions, pupils equal and react to light bilat Extremities: Symmetric in appearance Skin: No rashes, lesions or ulcers  Data Reviewed: I have personally reviewed following labs and imaging studies  CBC:  Recent Labs Lab 06/01/17 0316 06/02/17 0537 06/03/17 0404 06/04/17 0516 06/05/17 0258  WBC 1.8* 2.7* 2.0* 2.1* 1.9*  NEUTROABS 1.3* 1.8 1.3* 1.2* 1.2*  HGB 10.2* 9.9* 9.3* 10.4* 9.6*  HCT 32.1* 30.3* 28.7* 30.7* 30.2*  MCV 90.7 89.6 90.5 91.4 91.5  PLT 327 368 371 352 330   Basic Metabolic Panel:  Recent Labs Lab 05/31/17 0508  06/01/17 0316 06/02/17 0537 06/03/17 0404 06/04/17 0516 06/05/17 0258  NA 127*  < > 131* 132* 135  135 135 133*  K 5.8*  < > 4.0 4.1 4.1  4.1 3.9 4.3  CL 103  < > 106 106 107  107 105 106  CO2 21*  < > 20* 21* 23  23 26 22   GLUCOSE 480*  < > 104* 136*  132*  131* 98 94  BUN 17  < > 14 10 11  11 15 14   CREATININE 0.61  < > 0.54* 0.56* 0.62  0.62 0.65 0.65  CALCIUM 7.7*  < > 8.5* 8.7* 8.7*  8.6* 8.9 8.8*  MG  --   --  1.3* 1.4* 1.3* 1.6* 1.7  PHOS 3.7  --  5.1* 4.3 5.1*  --  6.0*  < > = values in this interval not displayed. GFR: Estimated Creatinine Clearance: 131.4 mL/min (by C-G formula based on SCr of 0.65 mg/dL). Liver Function Tests:  Recent Labs Lab 05/31/17 0801 06/01/17 0316 06/02/17 0537 06/03/17 0404 06/04/17 0516 06/05/17 0258  AST 93*  --   --  73* 81*  --   ALT 95*  --   --  81* 90*  --   ALKPHOS 223*  --   --  183* 190*  --   BILITOT 0.7  --   --  0.3 0.4  --   PROT 8.5*  --   --  7.5 7.6  --   ALBUMIN 2.4* 2.2* 2.3* 2.3*  2.4* 2.5* 2.6*   No results for input(s): LIPASE, AMYLASE in the last 168 hours. No results for input(s): AMMONIA in the last 168 hours. Coagulation Profile: No results for input(s): INR, PROTIME in the last 168 hours. Cardiac Enzymes: No results for input(s): CKTOTAL, CKMB, CKMBINDEX, TROPONINI in the last 168 hours. BNP (last 3 results) No  results for input(s): PROBNP in the last 8760 hours. HbA1C: No results for input(s): HGBA1C in the last 72 hours. CBG:  Recent Labs Lab 06/04/17 2335 06/05/17 0337 06/05/17 0741 06/05/17 1114 06/05/17 1253  GLUCAP 81 90 88 67 84   Lipid Profile: No results for input(s): CHOL, HDL, LDLCALC, TRIG, CHOLHDL, LDLDIRECT in the last 72 hours. Thyroid Function Tests: No results for input(s): TSH, T4TOTAL, FREET4, T3FREE, THYROIDAB in the last 72 hours. Anemia Panel: No results for input(s): VITAMINB12, FOLATE, FERRITIN, TIBC, IRON, RETICCTPCT in the last 72 hours. Sepsis Labs: No results for input(s): PROCALCITON, LATICACIDVEN in the last 168 hours.  Recent Results (from the past 240 hour(s))  CSF culture with Stat gram stain     Status: None   Collection Time: 05/29/17 12:19 PM  Result Value Ref Range Status   Specimen Description CSF  Final   Special Requests NONE  Final   Gram Stain   Final    NO WBC SEEN YEAST CORRECTED RESULTS PREVIOUSLY REPORTED AS: NO ORGANISMS SEEN CORRECTED RESULTS CALLED TO: M HARPER,RN AT 1525 05/29/17 BY L BENFIELD    Culture   Final    FEW STAPHYLOCOCCUS EPIDERMIDIS CRITICAL RESULT CALLED TO, READ BACK BY AND VERIFIED WITH: DR Drue Second 05/30/17 @ 1119 M VESTAL    Report Status 06/05/2017 FINAL  Final   Organism ID, Bacteria STAPHYLOCOCCUS EPIDERMIDIS  Final      Susceptibility   Staphylococcus epidermidis - MIC*    CIPROFLOXACIN <=0.5 SENSITIVE Sensitive     ERYTHROMYCIN >=8 RESISTANT Resistant     GENTAMICIN <=0.5 SENSITIVE Sensitive     OXACILLIN >=4 RESISTANT Resistant     TETRACYCLINE 2 SENSITIVE Sensitive     VANCOMYCIN 2 SENSITIVE Sensitive     TRIMETH/SULFA >=320 RESISTANT Resistant     CLINDAMYCIN >=8 RESISTANT Resistant     RIFAMPIN <=0.5 SENSITIVE Sensitive     Inducible Clindamycin NEGATIVE Sensitive     * FEW STAPHYLOCOCCUS EPIDERMIDIS  CSF culture     Status: None (Preliminary result)  Collection Time: 06/05/17  9:39 AM  Result  Value Ref Range Status   Specimen Description CSF  Final   Special Requests Immunocompromised  Final   Gram Stain   Final    WBC PRESENT, PREDOMINANTLY MONONUCLEAR BUDDING YEAST SEEN CRITICAL RESULT CALLED TO, READ BACK BY AND VERIFIED WITH: Buckman RN 10:20 06/05/17 ([email or web address])    Culture PENDING  Incomplete   Report Status PENDING  Incomplete       Radiology Studies: Ct Head Wo Contrast  Result Date: 06/04/2017 CLINICAL DATA:  HIV with cryptococcal meningoencephalitis. Ventriculostomy tube removal with fluid leak. EXAM: CT HEAD WITHOUT CONTRAST TECHNIQUE: Contiguous axial images were obtained from the base of the skull through the vertex without intravenous contrast. COMPARISON:  MRI 05/25/2017 FINDINGS: Brain: Interval removal of right ventricular catheter. Ventricle size remains normal. No hemorrhage or fluid collection. MRI findings of cortical edema enhancement with restricted diffusion are best seen on prior MRI. No large vessel infarct is identified on CT. No acute hemorrhage. 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 Vascular: Negative for hyperdense vessel Skull: Negative Sinuses/Orbits: Mucosal edema in the paranasal sinuses. Other: None IMPRESSION: Interval removal of right frontal ventricular catheter. No hydrocephalus or fluid collection identified. No acute hemorrhage Cortical restricted diffusion and left meningeal enhance on MRI are best seen on the prior MRI. No acute infarct on today's CT. Electronically Signed   By: Marlan Palau M.D.   On: 06/04/2017 14:25      Scheduled Meds: . acetaminophen  650 mg Oral Q24H  . azithromycin  1,200 mg Per Tube Weekly  . chlorhexidine  15 mL Mouth Rinse BID  . Chlorhexidine Gluconate Cloth  6 each Topical Daily  . diphenhydrAMINE  25 mg Oral Q24H  . flucytosine  1,500 mg Per Tube Q6H  . insulin aspart  2-6 Units Subcutaneous Q4H  . lidocaine (PF)  10 mL Intradermal Once  . magnesium oxide  400 mg Per Tube BID  . mouth  rinse  15 mL Mouth Rinse q12n4p  . metoprolol tartrate  5 mg Intravenous Q8H  . potassium chloride  40 mEq Oral Daily  . sulfamethoxazole-trimethoprim  10 mL Per Tube Daily   Continuous Infusions: . sodium chloride Stopped (06/05/17 0200)  . amphotericin  B  Liposome (AMBISOME) ADULT IV Stopped (06/04/17 1927)  . dextrose 5 % and 0.45% NaCl 50 mL/hr at 06/05/17 0600  . feeding supplement (VITAL AF 1.2 CAL) 1,000 mL (06/05/17 0600)  . levETIRAcetam Stopped (06/05/17 1136)  . linezolid (ZYVOX) IV Stopped (06/05/17 1210)  . magnesium sulfate 1 - 4 g bolus IVPB 6 g (06/05/17 1141)  . sodium chloride    . sodium chloride       LOS: 16 days    Time spent: 30 minutes   Noralee Stain, DO [facility] [email or web address] Password [facility] 06/05/2017, 2:08 PM

## 2017-06-05 NOTE — Progress Notes (Addendum)
Indication: evaluate for elevated ICP  Risks of the procedure were dicussed with grandmother Baldwin JamaicaSue Bartley including post-LP headache, bleeding, infection, weakness/numbness of legs(radiculopathy), death.  The patient's proxy agreed and written consent was obtained.   The patient was prepped and draped, and using sterile technique a 20 gauge quinke spinal needle was inserted in the L 4/5 interspace. The opening pressure was 27 mm Hg. Approximately  12 cc of CSF were obtained and sent for analysis. No complications were encountered and patient tolerated the procedure well. Only one attempt was needed to obtain CSF.   Felicie Mornavid Smith PA-C [facility] [phone]  M-F  (8:30 am- 4 PM)  Electronically signed: Dr. Caryl PinaEric Brecken Dewoody 06/05/2017, 9:14 AM

## 2017-06-05 NOTE — Progress Notes (Signed)
Pharmacy Consult Note:  Electrolyte Replacement  26 yom currently on treatment for cryptococcal meningitis and strep pneumo bacteremia. Pharmacy consulted by ID to manage electrolyte replacement. SCr remains WNL at 0.62   K 4.2 on KCl 40meq daily Mg 1.7 - up from 1.6 despite Mg 6g IV x 1 yesterday  Plan: - Continue KCl 40 mEq daily - Magnesium 6gm IV x 1 - Start magnesium oxide 400 mg PT twice daily - F/u AM lytes  Thank you for allowing pharmacy to be a part of this patient's care.  Georgina PillionElizabeth Aerionna Moravek, PharmD, BCPS Clinical Pharmacist Pager: [phone] Clinical phone for 06/05/2017 from 7a-3:30p: [phone] If after 3:30p, please call main pharmacy at: [phone] 06/05/2017 9:12 AM

## 2017-06-05 NOTE — Progress Notes (Signed)
[facility]   Reason for visit: Follow up on cryptococcal meningitis  Interval History: LP done and increase pressure to 27 cm H2O, gram stain with yeast.  Patient does not make any sounds or interact.  WBC 1.9; creat 0.65 Linezolid Day 7/14 Amphotericin/flucytosine day 17    Physical Exam: Constitutional:  Vitals:   06/05/17 0335 06/05/17 0700  BP: 103/66 107/73  Pulse: 88 (!) 115  Resp: 14 16  Temp: 99 F (37.2 C) 97.2 F (36.2 C)   patient appears in NAD Eyes: anicteric HENT: no thrush Respiratory: Normal respiratory effort; CTA B Cardiovascular: RRR GI: soft, nt, nd  Review of Systems: Unable to be assessed due to mental status  Lab Results  Component Value Date   WBC 1.9 (L) 06/05/2017   HGB 9.6 (L) 06/05/2017   HCT 30.2 (L) 06/05/2017   MCV 91.5 06/05/2017   PLT 330 06/05/2017    Lab Results  Component Value Date   CREATININE 0.65 06/05/2017   BUN 14 06/05/2017   NA 133 (L) 06/05/2017   K 4.3 06/05/2017   CL 106 06/05/2017   CO2 22 06/05/2017    Lab Results  Component Value Date   ALT 90 (H) 06/04/2017   AST 81 (H) 06/04/2017   ALKPHOS 190 (H) 06/04/2017     Microbiology: Recent Results (from the past 240 hour(s))  CSF culture with Stat gram stain     Status: None (Preliminary result)   Collection Time: 05/29/17 12:19 PM  Result Value Ref Range Status   Specimen Description CSF  Final   Special Requests NONE  Final   Gram Stain   Final    NO WBC SEEN YEAST CORRECTED RESULTS PREVIOUSLY REPORTED AS: NO ORGANISMS SEEN CORRECTED RESULTS CALLED TO: M HARPER,RN AT 1525 05/29/17 BY L BENFIELD    Culture   Final    FEW STAPHYLOCOCCUS EPIDERMIDIS CRITICAL RESULT CALLED TO, READ BACK BY AND VERIFIED WITH: DR Drue Second 05/30/17 @ 1119 M VESTAL CONTINUING TO HOLD    Report Status PENDING  Incomplete   Organism ID, Bacteria STAPHYLOCOCCUS EPIDERMIDIS  Final      Susceptibility   Staphylococcus epidermidis - MIC*   CIPROFLOXACIN <=0.5 SENSITIVE Sensitive     ERYTHROMYCIN >=8 RESISTANT Resistant     GENTAMICIN <=0.5 SENSITIVE Sensitive     OXACILLIN >=4 RESISTANT Resistant     TETRACYCLINE 2 SENSITIVE Sensitive     VANCOMYCIN 2 SENSITIVE Sensitive     TRIMETH/SULFA >=320 RESISTANT Resistant     CLINDAMYCIN >=8 RESISTANT Resistant     RIFAMPIN <=0.5 SENSITIVE Sensitive     Inducible Clindamycin NEGATIVE Sensitive     * FEW STAPHYLOCOCCUS EPIDERMIDIS  CSF culture     Status: None (Preliminary result)   Collection Time: 06/05/17  9:39 AM  Result Value Ref Range Status   Specimen Description CSF  Final   Special Requests Immunocompromised  Final   Gram Stain   Final    WBC PRESENT, PREDOMINANTLY MONONUCLEAR BUDDING YEAST SEEN CRITICAL RESULT CALLED TO, READ BACK BY AND VERIFIED WITH: Buckman RN 10:20 06/05/17 ([email or web address])    Culture PENDING  Incomplete   Report Status PENDING  Incomplete    Impression/Plan:  1. increased intracranial pressure - I discussed this with Dr. Otelia Limes and from an ID perspective it is appropriate to proceed with VPS placement for his no-obstructive hydrocephalus at this time.  He has had recent Staph epi in culture but has been on appropriate treatment with  linezolid 7 days and Cryptococcal has not grown again in the CSF from 6/5.   2.  Cryptococcal meningitis - will continue with induction therapy with above, though CSF parameters seem to be improving with miminal WBC.  His glucose though is down to 34 which can be a marker of disease.    3. Leukopenia - slight decrease today.  I most suspect medication related to Flucytosine and possibly linezolid. No changes at this time due to the severity of his illness and need for above medications.    4.  HIV - I will continue to hold on his ARVs due to #2 and concern for IRIS.    5.  Encephalopathy - did improve after LP.  As above, may benefit from VPS.    Discussed with Dr. Otelia LimesLindzen.

## 2017-06-05 NOTE — Progress Notes (Signed)
I met with pt's Mom and Grandmother in conference room to discuss Justin Ball's overall rehab needs and further physical needs after hospitalization. 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 I will contact financial counselor to inquire into disability and Medicaid applications. Grandmother would like pt to return home with her, but questions if family and friends would assist her with his needs. I await further progress once VP shunt is placed to assist with planning his dispo. I will follow up tomorrow. [phone]

## 2017-06-05 NOTE — Progress Notes (Addendum)
Subjective: Significant cognitive improvement within 30 minutes of completion of LP. Now following commands and also with monosyllabic speech output. Opening pressure was 27 with drainage of 12 cc CSF.   Objective: Current vital signs: BP 107/73 (BP Location: Right Arm)   Pulse (!) 115   Temp 97.2 F (36.2 C) (Axillary)   Resp 16   Ht '6\' 1"'  (1.854 m)   Wt 66.4 kg (146 lb 6.2 oz)   SpO2 100%   BMI 19.31 kg/m  Vital signs in last 24 hours: Temp:  [97.2 F (36.2 C)-99 F (37.2 C)] 97.2 F (36.2 C) (06/12 0700) Pulse Rate:  [88-115] 115 (06/12 0700) Resp:  [14-20] 16 (06/12 0700) BP: (102-121)/(66-87) 107/73 (06/12 0700) SpO2:  [99 %-100 %] 100 % (06/12 0700) Weight:  [66.4 kg (146 lb 6.2 oz)] 66.4 kg (146 lb 6.2 oz) (06/12 0505)  Intake/Output from previous day: 06/11 0701 - 06/12 0700 In: 6904.5 [P.O.:240; I.V.:1469.5; NG/GT:1875; IV QBHALPFXT:0240] Out: 9735 [Urine:6675] Intake/Output this shift: No intake/output data recorded. Nutritional status: DIET DYS 2 Room service appropriate? Yes; Fluid consistency: Thin  Neurologic Exam: Ment: Awake. Decreased alertness. Significant cognitive impairment but with improvement since yesterday and earlier this AM. Follows simple commands and will answer some questions correctly with monosyllabic, hypophonic/dysarthric replies. Flattened affect. Does not attend well to visual stimuli, but will track examiner's face when asked.  CN: PERRL. Saccadic visual pursuits without nystagmus. Face with decreased motor tone, symmetrically. Hearing intact to voice. Motor: Moves all 4 extremities equally. Reflexes: 4+ patellae bilaterally.   Lab Results: Results for orders placed or performed during the hospital encounter of 05/20/17 (from the past 48 hour(s))  Glucose, capillary     Status: None   Collection Time: 06/03/17 12:31 PM  Result Value Ref Range   Glucose-Capillary 85 65 - 99 mg/dL   Comment 1 Notify RN    Comment 2 Document in Chart    Glucose, capillary     Status: None   Collection Time: 06/03/17  3:57 PM  Result Value Ref Range   Glucose-Capillary 90 65 - 99 mg/dL   Comment 1 Notify RN    Comment 2 Document in Chart   Glucose, capillary     Status: Abnormal   Collection Time: 06/03/17  8:07 PM  Result Value Ref Range   Glucose-Capillary 101 (H) 65 - 99 mg/dL  Glucose, capillary     Status: None   Collection Time: 06/03/17 11:06 PM  Result Value Ref Range   Glucose-Capillary 94 65 - 99 mg/dL  Glucose, capillary     Status: None   Collection Time: 06/04/17  3:11 AM  Result Value Ref Range   Glucose-Capillary 89 65 - 99 mg/dL  Magnesium     Status: Abnormal   Collection Time: 06/04/17  5:16 AM  Result Value Ref Range   Magnesium 1.6 (L) 1.7 - 2.4 mg/dL  CBC with Differential/Platelet     Status: Abnormal   Collection Time: 06/04/17  5:16 AM  Result Value Ref Range   WBC 2.1 (L) 4.0 - 10.5 K/uL   RBC 3.36 (L) 4.22 - 5.81 MIL/uL   Hemoglobin 10.4 (L) 13.0 - 17.0 g/dL   HCT 30.7 (L) 39.0 - 52.0 %   MCV 91.4 78.0 - 100.0 fL   MCH 31.0 26.0 - 34.0 pg   MCHC 33.9 30.0 - 36.0 g/dL   RDW 14.1 11.5 - 15.5 %   Platelets 352 150 - 400 K/uL   Neutrophils Relative % 56 %  Lymphocytes Relative 21 %   Monocytes Relative 7 %   Eosinophils Relative 13 %   Basophils Relative 3 %   Neutro Abs 1.2 (L) 1.7 - 7.7 K/uL   Lymphs Abs 0.4 (L) 0.7 - 4.0 K/uL   Monocytes Absolute 0.1 0.1 - 1.0 K/uL   Eosinophils Absolute 0.3 0.0 - 0.7 K/uL   Basophils Absolute 0.1 0.0 - 0.1 K/uL   RBC Morphology ELLIPTOCYTES   Comprehensive metabolic panel     Status: Abnormal   Collection Time: 06/04/17  5:16 AM  Result Value Ref Range   Sodium 135 135 - 145 mmol/L   Potassium 3.9 3.5 - 5.1 mmol/L   Chloride 105 101 - 111 mmol/L   CO2 26 22 - 32 mmol/L   Glucose, Bld 98 65 - 99 mg/dL   BUN 15 6 - 20 mg/dL   Creatinine, Ser 0.65 0.61 - 1.24 mg/dL   Calcium 8.9 8.9 - 10.3 mg/dL   Total Protein 7.6 6.5 - 8.1 g/dL   Albumin 2.5 (L) 3.5  - 5.0 g/dL   AST 81 (H) 15 - 41 U/L   ALT 90 (H) 17 - 63 U/L   Alkaline Phosphatase 190 (H) 38 - 126 U/L   Total Bilirubin 0.4 0.3 - 1.2 mg/dL   GFR calc non Af Amer >60 >60 mL/min   GFR calc Af Amer >60 >60 mL/min    Comment: (NOTE) The eGFR has been calculated using the CKD EPI equation. This calculation has not been validated in all clinical situations. eGFR's persistently <60 mL/min signify possible Chronic Kidney Disease.    Anion gap 4 (L) 5 - 15  Glucose, capillary     Status: None   Collection Time: 06/04/17  7:39 AM  Result Value Ref Range   Glucose-Capillary 77 65 - 99 mg/dL  Glucose, capillary     Status: None   Collection Time: 06/04/17 12:18 PM  Result Value Ref Range   Glucose-Capillary 82 65 - 99 mg/dL  Glucose, capillary     Status: None   Collection Time: 06/04/17  4:07 PM  Result Value Ref Range   Glucose-Capillary 93 65 - 99 mg/dL  Glucose, capillary     Status: None   Collection Time: 06/04/17  7:42 PM  Result Value Ref Range   Glucose-Capillary 94 65 - 99 mg/dL  Glucose, capillary     Status: None   Collection Time: 06/04/17 11:35 PM  Result Value Ref Range   Glucose-Capillary 81 65 - 99 mg/dL  Magnesium     Status: None   Collection Time: 06/05/17  2:58 AM  Result Value Ref Range   Magnesium 1.7 1.7 - 2.4 mg/dL  CBC with Differential/Platelet     Status: Abnormal   Collection Time: 06/05/17  2:58 AM  Result Value Ref Range   WBC 1.9 (L) 4.0 - 10.5 K/uL   RBC 3.30 (L) 4.22 - 5.81 MIL/uL   Hemoglobin 9.6 (L) 13.0 - 17.0 g/dL   HCT 30.2 (L) 39.0 - 52.0 %   MCV 91.5 78.0 - 100.0 fL   MCH 29.1 26.0 - 34.0 pg   MCHC 31.8 30.0 - 36.0 g/dL   RDW 14.2 11.5 - 15.5 %   Platelets 330 150 - 400 K/uL   Neutrophils Relative % 57 %   Lymphocytes Relative 23 %   Monocytes Relative 7 %   Eosinophils Relative 11 %   Basophils Relative 2 %   Neutro Abs 1.2 (L) 1.7 - 7.7 K/uL  Lymphs Abs 0.4 (L) 0.7 - 4.0 K/uL   Monocytes Absolute 0.1 0.1 - 1.0 K/uL    Eosinophils Absolute 0.2 0.0 - 0.7 K/uL   Basophils Absolute 0.0 0.0 - 0.1 K/uL   RBC Morphology ELLIPTOCYTES   Renal function panel     Status: Abnormal   Collection Time: 06/05/17  2:58 AM  Result Value Ref Range   Sodium 133 (L) 135 - 145 mmol/L   Potassium 4.3 3.5 - 5.1 mmol/L   Chloride 106 101 - 111 mmol/L   CO2 22 22 - 32 mmol/L   Glucose, Bld 94 65 - 99 mg/dL   BUN 14 6 - 20 mg/dL   Creatinine, Ser 0.65 0.61 - 1.24 mg/dL   Calcium 8.8 (L) 8.9 - 10.3 mg/dL   Phosphorus 6.0 (H) 2.5 - 4.6 mg/dL   Albumin 2.6 (L) 3.5 - 5.0 g/dL   GFR calc non Af Amer >60 >60 mL/min   GFR calc Af Amer >60 >60 mL/min    Comment: (NOTE) The eGFR has been calculated using the CKD EPI equation. This calculation has not been validated in all clinical situations. eGFR's persistently <60 mL/min signify possible Chronic Kidney Disease.    Anion gap 5 5 - 15  Glucose, capillary     Status: None   Collection Time: 06/05/17  3:37 AM  Result Value Ref Range   Glucose-Capillary 90 65 - 99 mg/dL  Glucose, capillary     Status: None   Collection Time: 06/05/17  7:41 AM  Result Value Ref Range   Glucose-Capillary 88 65 - 99 mg/dL   Comment 1 Notify RN    Comment 2 Document in Chart     Recent Results (from the past 240 hour(s))  CSF culture with Stat gram stain     Status: None (Preliminary result)   Collection Time: 05/29/17 12:19 PM  Result Value Ref Range Status   Specimen Description CSF  Final   Special Requests NONE  Final   Gram Stain   Final    NO WBC SEEN YEAST CORRECTED RESULTS PREVIOUSLY REPORTED AS: NO ORGANISMS SEEN CORRECTED RESULTS CALLED TO: M HARPER,RN AT 1525 05/29/17 BY L BENFIELD    Culture   Final    FEW STAPHYLOCOCCUS EPIDERMIDIS CRITICAL RESULT CALLED TO, READ BACK BY AND VERIFIED WITH: DR Baxter Flattery 05/30/17 @ 17 M VESTAL CONTINUING TO HOLD    Report Status PENDING  Incomplete   Organism ID, Bacteria STAPHYLOCOCCUS EPIDERMIDIS  Final      Susceptibility   Staphylococcus  epidermidis - MIC*    CIPROFLOXACIN <=0.5 SENSITIVE Sensitive     ERYTHROMYCIN >=8 RESISTANT Resistant     GENTAMICIN <=0.5 SENSITIVE Sensitive     OXACILLIN >=4 RESISTANT Resistant     TETRACYCLINE 2 SENSITIVE Sensitive     VANCOMYCIN 2 SENSITIVE Sensitive     TRIMETH/SULFA >=320 RESISTANT Resistant     CLINDAMYCIN >=8 RESISTANT Resistant     RIFAMPIN <=0.5 SENSITIVE Sensitive     Inducible Clindamycin NEGATIVE Sensitive     * FEW STAPHYLOCOCCUS EPIDERMIDIS    Lipid Panel No results for input(s): CHOL, TRIG, HDL, CHOLHDL, VLDL, LDLCALC in the last 72 hours.  Studies/Results: Ct Head Wo Contrast  Result Date: 06/04/2017 CLINICAL DATA:  HIV with cryptococcal meningoencephalitis. Ventriculostomy tube removal with fluid leak. EXAM: CT HEAD WITHOUT CONTRAST TECHNIQUE: Contiguous axial images were obtained from the base of the skull through the vertex without intravenous contrast. COMPARISON:  MRI 05/25/2017 FINDINGS: Brain: Interval removal of right  ventricular catheter. Ventricle size remains normal. No hemorrhage or fluid collection. MRI findings of cortical edema enhancement with restricted diffusion are best seen on prior MRI. No large vessel infarct is identified on CT. No acute hemorrhage. 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 Vascular: Negative for hyperdense vessel Skull: Negative Sinuses/Orbits: Mucosal edema in the paranasal sinuses. Other: None IMPRESSION: Interval removal of right frontal ventricular catheter. No hydrocephalus or fluid collection identified. No acute hemorrhage Cortical restricted diffusion and left meningeal enhance on MRI are best seen on the prior MRI. No acute infarct on today's CT. Electronically Signed   By: Franchot Gallo M.D.   On: 06/04/2017 14:25    Medications:  Scheduled: . acetaminophen  650 mg Oral Q24H  . azithromycin  1,200 mg Per Tube Weekly  . chlorhexidine  15 mL Mouth Rinse BID  . Chlorhexidine Gluconate Cloth  6 each Topical  Daily  . diphenhydrAMINE  25 mg Oral Q24H  . flucytosine  1,500 mg Per Tube Q6H  . insulin aspart  2-6 Units Subcutaneous Q4H  . lidocaine (PF)  10 mL Intradermal Once  . magnesium oxide  400 mg Per Tube BID  . mouth rinse  15 mL Mouth Rinse q12n4p  . metoprolol tartrate  5 mg Intravenous Q8H  . potassium chloride  40 mEq Oral Daily  . sulfamethoxazole-trimethoprim  10 mL Per Tube Daily   Continuous: . sodium chloride Stopped (06/05/17 0200)  . amphotericin  B  Liposome (AMBISOME) ADULT IV Stopped (06/04/17 1927)  . dextrose 5 % and 0.45% NaCl 50 mL/hr at 06/05/17 0600  . feeding supplement (VITAL AF 1.2 CAL) 1,000 mL (06/05/17 0600)  . levETIRAcetam Stopped (06/04/17 2131)  . linezolid (ZYVOX) IV Stopped (06/04/17 2209)  . magnesium sulfate 1 - 4 g bolus IVPB    . sodium chloride    . sodium chloride     DVV:OHYWVP chloride, acetaminophen, albuterol, LORazepam, metoprolol tartrate, ondansetron (ZOFRAN) IV, sodium chloride flush  Assessment/Plan: 1. Cryptococcal meningoencephalitis. Per ID, he is to continue induction therapy with amphotericin and flucytosine. Given yeast on gram stain from recent CSF, ID had recommended that a repeat CSF sample be obtained this week. LP was done today with samples sent to lab for repeat fungal stain and culture. Opening pressure was 27 with 12 cc drained. Following today's LP, the patient's cognition improved significantly.  2. 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 MRI revealed. multifocal cortical diffusion restriction involving both cerebellar hemispheres and the bilateral temporal, occipital and frontal lobes, as well as the deep gray nuclei. Also noted were symmetric regions of restricted diffusion within the hippocampi - this latter finding is also suspicious for possible subclinical status epilepticus in addition to the observable seizures, occurring prior to  the MRI scan.  EEG on 5/28 showed diffuse slowing without epileptiform discharges.  3. Encephalophathy. Secondary to #1 and #2, above, as well as increased ICP. 4. Seizures. Have not recurred since initial presentation. Most likely secondary to #1, above. Continue Keppra at 1000 mg BID. Can consider a slow taper with close monitoring after additional neurological improvement has occurred.  5. Culture-positive staph epi meningitis, lInezolid sensitive. Per ID, will need 14 days total antibiotic treatment.  6. HIV/AIDS. ID is following. Antiretroviral therapy is on hold.  7. Persistent CSF leak from ventriculostomy incision site. We suspected possible increasing ICP to account for the leak and his worsened cognition after removal of the  ventricular drain. Neurosurgery was contacted yesterday with plan to obtain LP for opening pressure. We will discuss the elevated CSF pressure from today's LP with Neurosurgery. The patient most likely will need VPS placement for non-obstructive hydrocephalus secondary to residual scarring and fibrosis of the subarachnoid space from his cryptococcal meningitis. 8. Per ID, the patient has no contraindications for VPS placement at this time and no additional observation or CSF re-sampling is needed prior to VPS placement.    LOS: 16 days   '@Electronically'  signed: Dr. Kerney Elbe 06/05/2017  10:09 AM

## 2017-06-05 NOTE — Progress Notes (Signed)
Physical Therapy Treatment Patient Details Name: Justin Ball MRN: [id number] DOB: August 28, 1991 Today's Date: 06/05/2017    History of Present Illness Pt is a 26 year old man admitted s/p seizure activity having several days of recent headaches. 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 EVD was placed after acute decompensation on 5/28 with decorticate posturing and asymmetric pupils. 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 PMH includes: syphilis, substance abuse, soft palate ulceration, scabies, neuromuscular disorder, HIV/AIDS, depression, bipolar disorder, anxiety    PT Comments    Patient more participatory today and more communicative. Pt able to stand and take a few sidesteps at EOB with +2 max A. Pt continues to be confused however more appropriate today. Current plan remains appropriate.   Follow Up Recommendations  CIR     Equipment Recommendations  Other (comment) (TBD next venue)    Recommendations for Other Services Rehab consult     Precautions / Restrictions Precautions Precautions: Fall Precaution Comments: flexiseal, foley, NG tube, IV     Mobility  Bed Mobility Overal bed mobility: Needs Assistance Bed Mobility: Supine to Sit;Sit to Supine     Supine to sit: Mod assist;+2 for safety/equipment Sit to supine: Max assist   General bed mobility comments: pt attempting to sit up with vc from therapist; required assistance at bilat LE and trunk and vc for sequencing/technique  Transfers Overall transfer level: Needs assistance Equipment used: 2 person hand held assist Transfers: Sit to/from Stand Sit to Stand: +2 physical assistance;Max assist         General transfer comment: assist required for balance upon standing; pt stood X2 and first trial stood briefly and then returned to sitting; second trial pt maintained standing with cues and able to take 3 sidesteps toward head of bed  with multimodal cues  Ambulation/Gait                 Stairs            Wheelchair Mobility    Modified Rankin (Stroke Patients Only)       Balance Overall balance assessment: Needs assistance Sitting-balance support: Bilateral upper extremity supported;Feet supported Sitting balance-Leahy Scale: Poor Sitting balance - Comments: mod/max A for sitting balance EOB; pt initiated return to supine due to c/o back pain     Standing balance-Leahy Scale: Zero                              Cognition Arousal/Alertness: Awake/alert Behavior During Therapy: Restless Overall Cognitive Status: Impaired/Different from baseline Area of Impairment: Following commands;Safety/judgement;Awareness;Attention;Orientation                 Orientation Level: Disoriented to;Place;Time;Situation Current Attention Level: Focused   Following Commands: Follows one step commands inconsistently Safety/Judgement: Decreased awareness of safety;Decreased awareness of deficits Awareness: Intellectual Problem Solving: Slow processing;Difficulty sequencing;Requires verbal cues;Requires tactile cues General Comments: pt more communicative this session; pt perseverated "I have to pee pee" pt reassured he could go due to having catheter and pt asked "what is a catheter" but did not believe therapist      Exercises      General Comments        Pertinent Vitals/Pain Pain Assessment: Faces Faces Pain Scale: Hurts little more Pain Location: "back and sides" Pain Descriptors / Indicators: Other (Comment) ("it hurts") Pain Intervention(s): Limited activity within patient's tolerance;Monitored during session;Repositioned    Home Living  Prior Function            PT Goals (current goals can now be found in the care plan section) Progress towards PT goals: Progressing toward goals    Frequency    Min 4X/week      PT Plan Current plan  remains appropriate    Co-evaluation              AM-PAC PT "6 Clicks" Daily Activity  Outcome Measure  Difficulty turning over in bed (including adjusting bedclothes, sheets and blankets)?: Total Difficulty moving from lying on back to sitting on the side of the bed? : Total Difficulty sitting down on and standing up from a chair with arms (e.g., wheelchair, bedside commode, etc,.)?: Total Help needed moving to and from a bed to chair (including a wheelchair)?: Total Help needed walking in hospital room?: A Lot Help needed climbing 3-5 steps with a railing? : Total 6 Click Score: 7    End of Session Equipment Utilized During Treatment: Gait belt Activity Tolerance: Patient tolerated treatment well Patient left: in bed;with call bell/phone within reach;with bed alarm set;with restraints reapplied;Other (comment) (pt in 4 point restraints ) Nurse Communication: Mobility status PT Visit Diagnosis: Other symptoms and signs involving the nervous system (R29.898);Muscle weakness (generalized) (M62.81)     Time: [id number] PT Time Calculation (min) (ACUTE ONLY): 24 min  Charges:  $Therapeutic Activity: 23-37 mins                    G Codes:       Erline LevineKellyn Seanmichael Salmons, PTA Pager: [phone]     Carolynne EdouardKellyn R Kieryn Burtis 06/05/2017, 3:52 PM

## 2017-06-06 ENCOUNTER — Ambulatory Visit: Payer: No Typology Code available for payment source

## 2017-06-06 ENCOUNTER — Inpatient Hospital Stay (HOSPITAL_COMMUNITY): Payer: No Typology Code available for payment source

## 2017-06-06 LAB — CBC WITH DIFFERENTIAL/PLATELET
BASOS ABS: 0 10*3/uL (ref 0.0–0.1)
Basophils Relative: 2 %
EOS PCT: 8 %
Eosinophils Absolute: 0.2 10*3/uL (ref 0.0–0.7)
HCT: 31.1 % — ABNORMAL LOW (ref 39.0–52.0)
Hemoglobin: 10.2 g/dL — ABNORMAL LOW (ref 13.0–17.0)
LYMPHS PCT: 25 %
Lymphs Abs: 0.5 10*3/uL — ABNORMAL LOW (ref 0.7–4.0)
MCH: 29.6 pg (ref 26.0–34.0)
MCHC: 32.8 g/dL (ref 30.0–36.0)
MCV: 90.1 fL (ref 78.0–100.0)
Monocytes Absolute: 0.2 10*3/uL (ref 0.1–1.0)
Monocytes Relative: 9 %
NEUTROS ABS: 1.3 10*3/uL — AB (ref 1.7–7.7)
NEUTROS PCT: 56 %
PLATELETS: 338 10*3/uL (ref 150–400)
RBC: 3.45 MIL/uL — AB (ref 4.22–5.81)
RDW: 13.8 % (ref 11.5–15.5)
WBC: 2.2 10*3/uL — AB (ref 4.0–10.5)

## 2017-06-06 LAB — RENAL FUNCTION PANEL
Albumin: 2.7 g/dL — ABNORMAL LOW (ref 3.5–5.0)
Anion gap: 7 (ref 5–15)
BUN: 10 mg/dL (ref 6–20)
CALCIUM: 8.7 mg/dL — AB (ref 8.9–10.3)
CHLORIDE: 105 mmol/L (ref 101–111)
CO2: 22 mmol/L (ref 22–32)
Creatinine, Ser: 0.68 mg/dL (ref 0.61–1.24)
Glucose, Bld: 89 mg/dL (ref 65–99)
Phosphorus: 6.1 mg/dL — ABNORMAL HIGH (ref 2.5–4.6)
Potassium: 4.2 mmol/L (ref 3.5–5.1)
SODIUM: 134 mmol/L — AB (ref 135–145)

## 2017-06-06 LAB — GLUCOSE, CAPILLARY
GLUCOSE-CAPILLARY: 89 mg/dL (ref 65–99)
GLUCOSE-CAPILLARY: 85 mg/dL (ref 65–99)
GLUCOSE-CAPILLARY: 97 mg/dL (ref 65–99)
GLUCOSE-CAPILLARY: 105 mg/dL — AB (ref 65–99)
Glucose-Capillary: 85 mg/dL (ref 65–99)
Glucose-Capillary: 77 mg/dL (ref 65–99)

## 2017-06-06 LAB — MAGNESIUM: MAGNESIUM: 1.7 mg/dL (ref 1.7–2.4)

## 2017-06-06 LAB — PATHOLOGIST SMEAR REVIEW

## 2017-06-06 MED ORDER — MAGNESIUM SULFATE 2 GM/50ML IV SOLN
2.0000 g | Freq: Once | INTRAVENOUS | Status: AC
  Administered 2017-06-06: 2 g via INTRAVENOUS
  Filled 2017-06-06: qty 50

## 2017-06-06 MED ORDER — MAGNESIUM SULFATE 50 % IJ SOLN
6.0000 g | Freq: Once | INTRAVENOUS | Status: AC
  Administered 2017-06-06: 6 g via INTRAVENOUS
  Filled 2017-06-06: qty 10

## 2017-06-06 MED ORDER — DIPHENHYDRAMINE HCL 25 MG PO CAPS
25.0000 mg | ORAL_CAPSULE | Freq: Every day | ORAL | Status: DC
  Administered 2017-06-07 – 2017-06-12 (×6): 25 mg via ORAL
  Filled 2017-06-06 (×6): qty 1

## 2017-06-06 MED ORDER — DIPHENHYDRAMINE HCL 50 MG/ML IJ SOLN: 25.0000 mg | Freq: Once | INTRAMUSCULAR | Status: DC

## 2017-06-06 MED ORDER — LORAZEPAM 2 MG/ML IJ SOLN
1.0000 mg | Freq: Once | INTRAMUSCULAR | Status: AC
  Administered 2017-06-06: 1 mg via INTRAVENOUS
  Filled 2017-06-06: qty 1

## 2017-06-06 NOTE — Progress Notes (Signed)
Pharmacy Consult Note:  Electrolyte Replacement  26 yom currently on treatment for cryptococcal meningitis and strep pneumo bacteremia. Pharmacy consulted by ID to manage electrolyte replacement. SCr remains stable.  K 4.2 on KCl 40meq daily Mg remains at 1.7 despite aggressive replacement over last week  Plan: Continue KCl 40 mEq daily Give Magnesium 6gm IV this morning and Magnesium 2g IV tonight Continue magnesium oxide 400 mg PT twice daily F/u AM lytes  Thank you for allowing pharmacy to be a part of this patient's care.  Enzo BiNathan Laelyn Blumenthal, PharmD, BCPS Clinical Pharmacist Pager [phone] 06/06/2017 7:43 AM

## 2017-06-06 NOTE — Progress Notes (Signed)
Patient's PICC line dressing appeared soiled. While changing the stat lock patient moved his arm suddenly and caused the catheter to come out about 2 cm further than on initial placement (according to Epic documentation). IV team notified; stated it should be fine, but recommended notifying MD just in case. Dr. Alvino Chapelhoi notified. [facility]  Leanna BattlesEckelmann, Erica Richwine Eileen, RN

## 2017-06-06 NOTE — Progress Notes (Signed)
PT Cancellation Note  Patient Details Name: Justin Ball MRN: [id number] DOB: 08/16/1991   Cancelled Treatment:    Reason Eval/Treat Not Completed: Fatigue/lethargy limiting ability to participate PT will check on pt later as time allows.    Justin Ball, PTA Pager: [phone]   06/06/2017, 10:03 AM

## 2017-06-06 NOTE — Progress Notes (Signed)
Physical Therapy Treatment Patient Details Name: Justin Ball MRN: [id number] DOB: 03/12/1991 Today's Date: 06/06/2017    History of Present Illness Pt is a 26 year old man admitted s/p seizure activity having several days of recent headaches. 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 EVD was placed after acute decompensation on 5/28 with decorticate posturing and asymmetric pupils. 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 PMH includes: syphilis, substance abuse, soft palate ulceration, scabies, neuromuscular disorder, HIV/AIDS, depression, bipolar disorder, anxiety    PT Comments    Patient able to ambulate this session very short distance with max +2 for physical assist with another for safety/equipment. Pt more participatory and alert however disoriented X3. HR up to 156 with OOB mobility. RN present end of session due to drainage from EVD site. Continue to progress as tolerated.    Follow Up Recommendations  CIR     Equipment Recommendations       Recommendations for Other Services Rehab consult     Precautions / Restrictions Precautions Precautions: Fall Precaution Comments: flexiseal, foley, NG tube, IV     Mobility  Bed Mobility Overal bed mobility: Needs Assistance Bed Mobility: Supine to Sit;Sit to Supine     Supine to sit: Total assist;+2 for physical assistance Sit to supine: Max assist;+2 for physical assistance   General bed mobility comments: Pt does not attempt to assist, and pushes self back toward bed   Transfers Overall transfer level: Needs assistance Equipment used: 2 person hand held assist Transfers: Sit to/from BJ'sStand;Stand Pivot Transfers Sit to Stand: Mod assist;+2 physical assistance Stand pivot transfers: Max assist;+2 physical assistance       General transfer comment: Pt spontaneously stands with min - mod A +2, but with posterior lean.  As he fatigues, assist  requirement increases.  Pt with poor awareness of environment and deficits and requires max verbal and tactile cues to attempt to right posture   Ambulation/Gait Ambulation/Gait assistance: Max assist;+2 physical assistance (+3 for safety /equipment) Ambulation Distance (Feet):  (approx 654ft ) Assistive device: 2 person hand held assist Gait Pattern/deviations: Step-to pattern;Scissoring;Decreased step length - right;Decreased step length - left;Decreased dorsiflexion - right;Decreased dorsiflexion - left;Narrow base of support     General Gait Details: pt required max +2 for balance, weight shifting, and assist to advance bilat LE; max multimodal cues for step positioning and wider BOS; pt began sitting prematurely and has little awareness of surrounding when navigating room   Stairs            Wheelchair Mobility    Modified Rankin (Stroke Patients Only)       Balance Overall balance assessment: Needs assistance Sitting-balance support: Feet supported;Bilateral upper extremity supported Sitting balance-Leahy Scale: Poor Sitting balance - Comments: Pt sat EOB x 8-10 mins with mod A - total A.  Pt with heavy pushing posteriorly and to the left.  Head appears to be key point of control for improving trunk and postural control  Postural control: Posterior lean;Left lateral lean Standing balance support: Bilateral upper extremity supported Standing balance-Leahy Scale: Poor Standing balance comment: Pt requires mod A +2 for static standing                             Cognition Arousal/Alertness: Awake/alert Behavior During Therapy: Impulsive;Restless Overall Cognitive Status: Impaired/Different from baseline Area of Impairment: Orientation;Attention;Following commands;Safety/judgement  Orientation Level: Disoriented to;Place;Time;Situation Current Attention Level: Focused   Following Commands: Follows one step commands  inconsistently Safety/Judgement: Decreased awareness of safety;Decreased awareness of deficits   Problem Solving: Slow processing;Decreased initiation;Difficulty sequencing;Requires verbal cues;Requires tactile cues General Comments: Pt very distracted requiring max cues to attend to task.  Doesn't think he's in hospital - thinks it's picture day       Exercises      General Comments General comments (skin integrity, edema, etc.): HR up to 156 with OOB mobility; clear fluid draining from EVD site--RN aware and present end of session      Pertinent Vitals/Pain Pain Assessment: Faces Faces Pain Scale: Hurts a little bit Pain Location: buttocks  Pain Descriptors / Indicators: Grimacing Pain Intervention(s): Monitored during session    Home Living                      Prior Function            PT Goals (current goals can now be found in the care plan section) Progress towards PT goals: Progressing toward goals    Frequency    Min 4X/week      PT Plan Current plan remains appropriate    Co-evaluation   Reason for Co-Treatment: For patient/therapist safety;Necessary to address cognition/behavior during functional activity;Complexity of the patient's impairments (multi-system involvement)   OT goals addressed during session: ADL's and self-care      AM-PAC PT "6 Clicks" Daily Activity  Outcome Measure  Difficulty turning over in bed (including adjusting bedclothes, sheets and blankets)?: Total Difficulty moving from lying on back to sitting on the side of the bed? : Total Difficulty sitting down on and standing up from a chair with arms (e.g., wheelchair, bedside commode, etc,.)?: Total Help needed moving to and from a bed to chair (including a wheelchair)?: Total Help needed walking in hospital room?: A Lot Help needed climbing 3-5 steps with a railing? : Total 6 Click Score: 7    End of Session Equipment Utilized During Treatment: Gait belt Activity  Tolerance: Patient tolerated treatment well Patient left: in bed;with call bell/phone within reach;with bed alarm set;with restraints reapplied;with nursing/sitter in room Nurse Communication: Mobility status PT Visit Diagnosis: Other symptoms and signs involving the nervous system (R29.898);Muscle weakness (generalized) (M62.81)     Time: 1610-9604 PT Time Calculation (min) (ACUTE ONLY): 46 min  Charges:  $Gait Training: 8-22 mins $Therapeutic Activity: 8-22 mins                    G Codes:       Erline Levine, PTA Pager: [phone]     Carolynne Edouard 06/06/2017, 4:29 PM

## 2017-06-06 NOTE — Progress Notes (Signed)
Talked with neurosurgery PA about date when VP shunt to be placed. They are waiting for ID to clear patient for surgery. Once cleared they will place patient on schedule immediately.   Felicie Mornavid Ramyah Pankowski PA-C [facility] [phone]  M-F  (8:30 am- 4 PM)  06/06/2017, 10:07 AM

## 2017-06-06 NOTE — Progress Notes (Signed)
PROGRESS NOTE    Justin Ball  ZOX:[id number] DOB: Oct 05, 1991 DOA: 05/20/2017 PCP: Justin Hiss, MD     Brief Narrative:  Justin Ball is a 26 y.o. male with a history of AIDS, noncompliance, syphilis, bipolar disorder who presented on 5/27 after being pulled from his car unresponsive with concern for new-onset seizures, postictal on arrival. He had been seen in ED 3 days prior with headaches, and discharged afebrile, well-appearing with negative neuroimaging. He was admitted to the ICU, intubated. LP performed demonstrated yeast and he was treated with amphotericin and flucytosine, in addition to vancomycin and zosyn, for cryptococcal meningitis. EEG showed PLEDs, keppra started. The day after admission the patient demonstrated a fixed right pupil and posturing. Mannitol was started, CT head negative, and ventriculostomy was placed by neurosurgery due to concern for herniation. Blood cultures grew positive with S. pneumoniae. CSF culture later grew S. epidermidis and linezolid was added on 6/6. He was extubated 6/2 and mental status is noted to still be altered from baseline. The patient was transferred to SDU and weaned from precedex on 6/8 to the Hospitalists service 6/9. Neurology, neurosurgery, and ID are consulting, and plan is for discharge to CIR once patient is stable and able to tolerate 3 hours of therapy daily.    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  -  Continue amphotericin and flucytosine per ID - Continue linezolid x14 days (6/6 - last dose on 6/19) - Continue keppra per neurology for seizure Tx - EVD removed 6/8  - LP repeated 6/12 with opening pressure 27 - Plan for VPS placement  - Appreciate neurology, neurosurgery, ID expertise   Acute encephalopathy: Multifactorial with meningoencephalitis, seizures, ischemic infarcts (no antiplatelet per neuro), metabolic derangements and medications; likely complicated by delirium. - Numerous scattered areas of ischemia on MRI - Treat underlying conditions. - Ativan prn agitation, off precedex since 6/8 - Avoid haldol with seizure disorder  - SLP evaluating, giving dysphagia 2 diet in addition to TF's  - More alert today than previous exams, continues to be altered  Streptococcus pneumoniae bacteremia - Linezolid per ID x14 days (6/6 - last dose on 6/19) - TTE ordered: No vegetations noted   Bordatella pneumonia - Was treated with doxycycline per ID, now off   AIDS: CD4 count April 2018 was 2.  - Hold ART per ID - Continue bactrim daily and weekly azithromycin (given 6/7) ppx  Neutropenia: Suspected due to flucytosine.  - Monitor.  Diarrhea: Acute, likely from tube feeds.  - Flexiseal - Anti-diarrheals ok  Chronic hepatitis B - Follow up with ID   DVT prophylaxis: SCDs Code Status: Full Family Communication: None at bedside Disposition Plan: Eventual DC to CIR once able to tolerate therapy for 3 hours daily. Plan for VPS    Consultants:   PCCM primary 5/27 - 6/8  Neurology  Neurosurgery  ID  Procedures:   OETT 5/27 - 6/3  OGT 5/27 - 6/3  NGT >>>   Right ventriculostomy  5/28 - 6/8  LUE TL PICC 6/3 >>>  Foley 6/4 >>>  Flexiseal 6/7  LP 6/12  Echocardiogram 06/02/2017: - Left ventricle: The cavity size was normal. Wall thickness was normal. Systolic function was vigorous. The estimated ejection fraction was in the range of 65% to 70%. Left  ventricular diastolic function parameters were normal. - Atrial septum: No defect or patent foramen ovale was identified.  Impressions: - No cardiac source of emboli was indentified.  Antimicrobials:  Anti-infectives    Start     Dose/Rate Route Frequency Ordered Stop   05/31/17 1000  azithromycin (ZITHROMAX) 200 MG/5ML suspension 1,200 mg     1,200 mg Per Tube Weekly 05/24/17 1315     05/30/17 1130  linezolid (ZYVOX) IVPB 600 mg     600 mg 300 mL/hr over 60 Minutes Intravenous Every 12 hours 05/30/17 1127     05/26/17 1215  doxycycline (VIBRAMYCIN) 100 mg in dextrose 5 % 250 mL IVPB  Status:  Discontinued     100 mg 125 mL/hr over 120 Minutes Intravenous Every 12 hours 05/26/17 1212 06/01/17 1448   05/26/17 0554  cefTRIAXone (ROCEPHIN) 2 g in dextrose 5 % 50 mL IVPB  Status:  Discontinued     2 g 100 mL/hr over 30 Minutes Intravenous Every 24 hours 05/25/17 1358 05/31/17 1323   05/24/17 1230  azithromycin (ZITHROMAX) 1,200 mg in dextrose 5 % 250 mL IVPB     1,200 mg 250 mL/hr over 60 Minutes Intravenous Weekly 05/24/17 1128 05/24/17 1407   05/21/17 0800  emtricitabine-rilpivir-tenofovir AF (ODEFSEY) 200-25-25 MG per tablet 1 tablet  Status:  Discontinued     1 tablet Oral Daily with breakfast 05/20/17 1813 05/20/17 1909   05/21/17 0600  vancomycin (VANCOCIN) IVPB 1000 mg/200 mL premix  Status:  Discontinued     1,000 mg 200 mL/hr over 60 Minutes Intravenous Every 8 hours 05/21/17 0549 05/21/17 1400   05/21/17 0545  cefTRIAXone (ROCEPHIN) 2 g in dextrose 5 % 50 mL IVPB  Status:  Discontinued     2 g 100 mL/hr over 30 Minutes Intravenous Every 12 hours 05/21/17 0531 05/25/17 1358   05/21/17 0100  cefTRIAXone (ROCEPHIN) 2 g in dextrose 5 % 50 mL IVPB  Status:  Discontinued     2 g 100 mL/hr over 30 Minutes Intravenous Every 12 hours 05/20/17 1245 05/20/17 1919   05/20/17 2100  vancomycin (VANCOCIN) IVPB 1000 mg/200 mL premix  Status:  Discontinued     1,000 mg 200 mL/hr over 60  Minutes Intravenous Every 8 hours 05/20/17 1245 05/20/17 1918   05/20/17 2000  ampicillin (OMNIPEN) 1 g in sodium chloride 0.9 % 50 mL IVPB  Status:  Discontinued     1 g 150 mL/hr over 20 Minutes Intravenous Every 4 hours 05/20/17 1833 05/20/17 1836   05/20/17 2000  ampicillin (OMNIPEN) 2 g in sodium chloride 0.9 % 50 mL IVPB  Status:  Discontinued     2 g 150 mL/hr over 20 Minutes Intravenous Every 4 hours 05/20/17 1836 05/20/17 1919   05/20/17 2000  sulfamethoxazole-trimethoprim (BACTRIM,SEPTRA) 200-40 MG/5ML suspension 10 mL     10 mL Per Tube Daily 05/20/17 1901     05/20/17 2000  emtricitabine-tenofovir AF (DESCOVY) 200-25 MG per tablet 1 tablet  Status:  Discontinued     1 tablet Oral Daily 05/20/17 1909 05/20/17 1919   05/20/17 2000  rilpivirine (EDURANT) tablet 25 mg  Status:  Discontinued     25 mg Per Tube Daily  with breakfast 05/20/17 1909 05/20/17 1919   05/20/17 2000  flucytosine (ANCOBON) capsule 1,500 mg     1,500 mg Per Tube Every 6 hours 05/20/17 1926     05/20/17 1815  dolutegravir (TIVICAY) tablet 50 mg  Status:  Discontinued     50 mg Oral Daily 05/20/17 1813 05/20/17 1919   05/20/17 1815  sulfamethoxazole-trimethoprim (BACTRIM,SEPTRA) 400-80 MG per tablet 1 tablet  Status:  Discontinued     1 tablet Oral Daily 05/20/17 1813 05/20/17 1901   05/20/17 1800  flucytosine (ANCOBON) capsule 1,500 mg  Status:  Discontinued     25 mg/kg  63.5 kg Per Tube Every 6 hours 05/20/17 1645 05/20/17 1926   05/20/17 1700  amphotericin B liposome (AMBISOME) 250 mg in dextrose 5 % 500 mL IVPB     4 mg/kg  63.5 kg 250 mL/hr over 120 Minutes Intravenous Every 24 hours 05/20/17 1645     05/20/17 1300  ampicillin (OMNIPEN) 2 g in sodium chloride 0.9 % 50 mL IVPB  Status:  Discontinued     2 g 150 mL/hr over 20 Minutes Intravenous  Once 05/20/17 1237 05/20/17 1242   05/20/17 1300  acyclovir (ZOVIRAX) 635 mg in dextrose 5 % 100 mL IVPB  Status:  Discontinued     10 mg/kg  63.5 kg 112.7  mL/hr over 60 Minutes Intravenous Every 8 hours 05/20/17 1243 05/20/17 1918   05/20/17 1245  cefTRIAXone (ROCEPHIN) 2 g in dextrose 5 % 50 mL IVPB     2 g 100 mL/hr over 30 Minutes Intravenous  Once 05/20/17 1237 05/20/17 1327   05/20/17 1245  vancomycin (VANCOCIN) IVPB 1000 mg/200 mL premix     1,000 mg 200 mL/hr over 60 Minutes Intravenous  Once 05/20/17 1237 05/20/17 1420      Subjective: More alert today, is able to tell me his name. However, he is quite agitated and does not follow commands or answer other questions appropriately.  Objective: Vitals:   06/06/17 0301 06/06/17 0517 06/06/17 0802 06/06/17 1126  BP: 118/72  120/80 121/76  Pulse: 98  (!) 105 (!) 112  Resp: 14  17 15   Temp: 97.9 F (36.6 C)  97.6 F (36.4 C) 98 F (36.7 C)  TempSrc: Axillary  Oral Oral  SpO2: 100%  100% 98%  Weight:  64.5 kg (142 lb 3.2 oz)    Height:        Intake/Output Summary (Last 24 hours) at 06/06/17 1313 Last data filed at 06/06/17 1230  Gross per 24 hour  Intake           6259.5 ml  Output             5775 ml  Net            484.5 ml   Filed Weights   06/04/17 0538 06/05/17 0505 06/06/17 0517  Weight: 68.1 kg (150 lb 2.1 oz) 66.4 kg (146 lb 6.2 oz) 64.5 kg (142 lb 3.2 oz)    Examination:  General exam: Appears More agitated today Respiratory system: Clear to auscultation. Respiratory effort normal. Cardiovascular system: S1 & S2 heard, RRR. No JVD, murmurs, rubs, gallops or clicks. No pedal edema. Gastrointestinal system: Abdomen is nondistended, soft and nontender. No organomegaly or masses felt. Normal bowel sounds heard. +NG in place, +Flexiseal in place  Central nervous system: Alert, agitated, does not follow command Extremities: Symmetric in appearance Skin: No rashes, lesions or ulcers  Data Reviewed: I have personally reviewed following labs and  imaging studies  CBC:  Recent Labs Lab 06/02/17 0537 06/03/17 0404 06/04/17 0516 06/05/17 0258 06/06/17 0358  WBC  2.7* 2.0* 2.1* 1.9* 2.2*  NEUTROABS 1.8 1.3* 1.2* 1.2* 1.3*  HGB 9.9* 9.3* 10.4* 9.6* 10.2*  HCT 30.3* 28.7* 30.7* 30.2* 31.1*  MCV 89.6 90.5 91.4 91.5 90.1  PLT 368 371 352 330 338   Basic Metabolic Panel:  Recent Labs Lab 06/01/17 0316 06/02/17 0537 06/03/17 0404 06/04/17 0516 06/05/17 0258 06/06/17 0358  NA 131* 132* 135  135 135 133* 134*  K 4.0 4.1 4.1  4.1 3.9 4.3 4.2  CL 106 106 107  107 105 106 105  CO2 20* 21* 23  23 26 22 22   GLUCOSE 104* 136* 132*  131* 98 94 89  BUN 14 10 11  11 15 14 10   CREATININE 0.54* 0.56* 0.62  0.62 0.65 0.65 0.68  CALCIUM 8.5* 8.7* 8.7*  8.6* 8.9 8.8* 8.7*  MG 1.3* 1.4* 1.3* 1.6* 1.7 1.7  PHOS 5.1* 4.3 5.1*  --  6.0* 6.1*   GFR: Estimated Creatinine Clearance: 127.7 mL/min (by C-G formula based on SCr of 0.68 mg/dL). Liver Function Tests:  Recent Labs Lab 05/31/17 0801  06/02/17 0537 06/03/17 0404 06/04/17 0516 06/05/17 0258 06/06/17 0358  AST 93*  --   --  73* 81*  --   --   ALT 95*  --   --  81* 90*  --   --   ALKPHOS 223*  --   --  183* 190*  --   --   BILITOT 0.7  --   --  0.3 0.4  --   --   PROT 8.5*  --   --  7.5 7.6  --   --   ALBUMIN 2.4*  < > 2.3* 2.3*  2.4* 2.5* 2.6* 2.7*  < > = values in this interval not displayed. No results for input(s): LIPASE, AMYLASE in the last 168 hours. No results for input(s): AMMONIA in the last 168 hours. Coagulation Profile: No results for input(s): INR, PROTIME in the last 168 hours. Cardiac Enzymes: No results for input(s): CKTOTAL, CKMB, CKMBINDEX, TROPONINI in the last 168 hours. BNP (last 3 results) No results for input(s): PROBNP in the last 8760 hours. HbA1C: No results for input(s): HGBA1C in the last 72 hours. CBG:  Recent Labs Lab 06/05/17 2001 06/05/17 2340 06/06/17 0303 06/06/17 0806 06/06/17 1130  GLUCAP 83 84 89 85 85   Lipid Profile: No results for input(s): CHOL, HDL, LDLCALC, TRIG, CHOLHDL, LDLDIRECT in the last 72 hours. Thyroid Function  Tests: No results for input(s): TSH, T4TOTAL, FREET4, T3FREE, THYROIDAB in the last 72 hours. Anemia Panel: No results for input(s): VITAMINB12, FOLATE, FERRITIN, TIBC, IRON, RETICCTPCT in the last 72 hours. Sepsis Labs: No results for input(s): PROCALCITON, LATICACIDVEN in the last 168 hours.  Recent Results (from the past 240 hour(s))  CSF culture with Stat gram stain     Status: None   Collection Time: 05/29/17 12:19 PM  Result Value Ref Range Status   Specimen Description CSF  Final   Special Requests NONE  Final   Gram Stain   Final    NO WBC SEEN YEAST CORRECTED RESULTS PREVIOUSLY REPORTED AS: NO ORGANISMS SEEN CORRECTED RESULTS CALLED TO: M HARPER,RN AT 1525 05/29/17 BY L BENFIELD    Culture   Final    FEW STAPHYLOCOCCUS EPIDERMIDIS CRITICAL RESULT CALLED TO, READ BACK BY AND VERIFIED WITH: DR Drue Second 05/30/17 @ 1119 M VESTAL  Report Status 06/05/2017 FINAL  Final   Organism ID, Bacteria STAPHYLOCOCCUS EPIDERMIDIS  Final      Susceptibility   Staphylococcus epidermidis - MIC*    CIPROFLOXACIN <=0.5 SENSITIVE Sensitive     ERYTHROMYCIN >=8 RESISTANT Resistant     GENTAMICIN <=0.5 SENSITIVE Sensitive     OXACILLIN >=4 RESISTANT Resistant     TETRACYCLINE 2 SENSITIVE Sensitive     VANCOMYCIN 2 SENSITIVE Sensitive     TRIMETH/SULFA >=320 RESISTANT Resistant     CLINDAMYCIN >=8 RESISTANT Resistant     RIFAMPIN <=0.5 SENSITIVE Sensitive     Inducible Clindamycin NEGATIVE Sensitive     * FEW STAPHYLOCOCCUS EPIDERMIDIS  CSF culture     Status: None (Preliminary result)   Collection Time: 06/05/17  9:39 AM  Result Value Ref Range Status   Specimen Description CSF  Final   Special Requests Immunocompromised  Final   Gram Stain   Final    WBC PRESENT, PREDOMINANTLY MONONUCLEAR BUDDING YEAST SEEN CRITICAL RESULT CALLED TO, READ BACK BY AND VERIFIED WITH: Buckman RN 10:20 06/05/17 ([email or web address])    Culture NO GROWTH 1 DAY  Final   Report Status PENDING  Incomplete        Radiology Studies: Ct Head Wo Contrast  Result Date: 06/04/2017 CLINICAL DATA:  HIV with cryptococcal meningoencephalitis. Ventriculostomy tube removal with fluid leak. EXAM: CT HEAD WITHOUT CONTRAST TECHNIQUE: Contiguous axial images were obtained from the base of the skull through the vertex without intravenous contrast. COMPARISON:  MRI 05/25/2017 FINDINGS: Brain: Interval removal of right ventricular catheter. Ventricle size remains normal. No hemorrhage or fluid collection. MRI findings of cortical edema enhancement with restricted diffusion are best seen on prior MRI. No large vessel infarct is identified on CT. No acute hemorrhage. 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 Vascular: Negative for hyperdense vessel Skull: Negative Sinuses/Orbits: Mucosal edema in the paranasal sinuses. Other: None IMPRESSION: Interval removal of right frontal ventricular catheter. No hydrocephalus or fluid collection identified. No acute hemorrhage Cortical restricted diffusion and left meningeal enhance on MRI are best seen on the prior MRI. No acute infarct on today's CT. Electronically Signed   By: Marlan Palauharles  Clark M.D.   On: 06/04/2017 14:25      Scheduled Meds: . acetaminophen  650 mg Oral Q24H  . azithromycin  1,200 mg Per Tube Weekly  . chlorhexidine  15 mL Mouth Rinse BID  . Chlorhexidine Gluconate Cloth  6 each Topical Daily  . diphenhydrAMINE  25 mg Oral Q24H  . flucytosine  1,500 mg Per Tube Q6H  . insulin aspart  2-6 Units Subcutaneous Q4H  . lidocaine (PF)  10 mL Intradermal Once  . magnesium oxide  400 mg Per Tube BID  . mouth rinse  15 mL Mouth Rinse q12n4p  . metoprolol tartrate  5 mg Intravenous Q8H  . potassium chloride  40 mEq Oral Daily  . sulfamethoxazole-trimethoprim  10 mL Per Tube Daily   Continuous Infusions: . sodium chloride Stopped (06/05/17 0200)  . amphotericin  B  Liposome (AMBISOME) ADULT IV Stopped (06/05/17 1958)  . dextrose 5 % and 0.45% NaCl 50  mL/hr at 06/06/17 0000  . feeding supplement (VITAL AF 1.2 CAL) 1,000 mL (06/06/17 1107)  . levETIRAcetam Stopped (06/06/17 0850)  . linezolid (ZYVOX) IV Stopped (06/06/17 1120)  . magnesium sulfate 1 - 4 g bolus IVPB    . sodium chloride    . sodium chloride       LOS: 17 days  Time spent: 30 minutes   Noralee Stain, DO [facility] [email or web address] Password [email or web address] 06/06/2017, 1:13 PM

## 2017-06-06 NOTE — Progress Notes (Signed)
[facility]    Date of Admission:  05/20/2017     Reason for visit:  Follow up on cryptococcal meningitis   Subjective: Opens eyes to voice. Moaning today, non-sensical. Followed a few commands.  Medications:  . acetaminophen  650 mg Oral Q24H  . azithromycin  1,200 mg Per Tube Weekly  . chlorhexidine  15 mL Mouth Rinse BID  . Chlorhexidine Gluconate Cloth  6 each Topical Daily  . diphenhydrAMINE  25 mg Oral Q24H  . flucytosine  1,500 mg Per Tube Q6H  . insulin aspart  2-6 Units Subcutaneous Q4H  . lidocaine (PF)  10 mL Intradermal Once  . magnesium oxide  400 mg Per Tube BID  . mouth rinse  15 mL Mouth Rinse q12n4p  . metoprolol tartrate  5 mg Intravenous Q8H  . potassium chloride  40 mEq Oral Daily  . sulfamethoxazole-trimethoprim  10 mL Per Tube Daily    Objective: Vital signs in last 24 hours: Temp:  [97.1 F (36.2 C)-98.4 F (36.9 C)] 97.6 F (36.4 C) (06/13 0802) Pulse Rate:  [97-109] 105 (06/13 0802) Resp:  [12-19] 17 (06/13 0802) BP: (108-125)/(68-81) 120/80 (06/13 0802) SpO2:  [95 %-100 %] 100 % (06/13 0802) Weight:  [142 lb 3.2 oz (64.5 kg)] 142 lb 3.2 oz (64.5 kg) (06/13 0517)  General: Young man resting in bed, NAD Neuro: alert but not oriented. Was able to lift right arm and wiggle toes.  Lab Results  Recent Labs  06/05/17 0258 06/06/17 0358  WBC 1.9* 2.2*  HGB 9.6* 10.2*  HCT 30.2* 31.1*  NA 133* 134*  K 4.3 4.2  CL 106 105  CO2 22 22  BUN 14 10  CREATININE 0.65 0.68   Liver Panel  Recent Labs  06/04/17 0516 06/05/17 0258 06/06/17 0358  PROT 7.6  --   --   ALBUMIN 2.5* 2.6* 2.7*  AST 81*  --   --   ALT 90*  --   --   ALKPHOS 190*  --   --   BILITOT 0.4  --   --     Studies/Results: Ct Head Wo Contrast  Result Date: 06/04/2017 CLINICAL DATA:  HIV with cryptococcal meningoencephalitis. Ventriculostomy tube removal with fluid leak. EXAM: CT HEAD WITHOUT CONTRAST TECHNIQUE: Contiguous axial images were  obtained from the base of the skull through the vertex without intravenous contrast. COMPARISON:  MRI 05/25/2017 FINDINGS: Brain: Interval removal of right ventricular catheter. Ventricle size remains normal. No hemorrhage or fluid collection. MRI findings of cortical edema enhancement with restricted diffusion are best seen on prior MRI. No large vessel infarct is identified on CT. No acute hemorrhage. 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 Vascular: Negative for hyperdense vessel Skull: Negative Sinuses/Orbits: Mucosal edema in the paranasal sinuses. Other: None IMPRESSION: Interval removal of right frontal ventricular catheter. No hydrocephalus or fluid collection identified. No acute hemorrhage Cortical restricted diffusion and left meningeal enhance on MRI are best seen on the prior MRI. No acute infarct on today's CT. Electronically Signed   By: Marlan Palau M.D.   On: 06/04/2017 14:25     Assessment/Plan:  Increased intracranial pressure: ID agrees with VP shunt placement and he is cleared to do this at this time. Appreciate Neurology coordinating with Neurosurgery. His CSF has not grown cryptococcus from 6/5. He has been on appropriate treatment for his staph epi meningitis.   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  Staph epi meningitis:  Continue Linezolid, day 8.  Leukopenia: Stable. WBC count 2.2 today. Likely related to Flucytosine and Linezolid.  HIV: Hold ART with cryptococcal meningitis and risk of IRIS.  Encephalopathy: More alert than before but remains encephalopathic. Only able to follow a few commands for me. Hopefully this will continue to improve with a VPS.   Attending note to follow  Rich Numberarly Avenir Lozinski, MD, MPH Internal Medicine Resident, PGY-3 06/06/2017, 10:05 AM

## 2017-06-07 LAB — CBC WITH DIFFERENTIAL/PLATELET
Basophils Absolute: 0 10*3/uL (ref 0.0–0.1)
Basophils Relative: 1 %
EOS PCT: 5 %
Eosinophils Absolute: 0.1 10*3/uL (ref 0.0–0.7)
HCT: 31.3 % — ABNORMAL LOW (ref 39.0–52.0)
Hemoglobin: 10.3 g/dL — ABNORMAL LOW (ref 13.0–17.0)
LYMPHS ABS: 0.5 10*3/uL — AB (ref 0.7–4.0)
LYMPHS PCT: 22 %
MCH: 29.4 pg (ref 26.0–34.0)
MCHC: 32.9 g/dL (ref 30.0–36.0)
MCV: 89.4 fL (ref 78.0–100.0)
MONO ABS: 0.2 10*3/uL (ref 0.1–1.0)
MONOS PCT: 9 %
Neutro Abs: 1.5 10*3/uL — ABNORMAL LOW (ref 1.7–7.7)
Neutrophils Relative %: 63 %
PLATELETS: 294 10*3/uL (ref 150–400)
RBC: 3.5 MIL/uL — ABNORMAL LOW (ref 4.22–5.81)
RDW: 13.9 % (ref 11.5–15.5)
WBC: 2.3 10*3/uL — ABNORMAL LOW (ref 4.0–10.5)

## 2017-06-07 LAB — GLUCOSE, CAPILLARY
GLUCOSE-CAPILLARY: 79 mg/dL (ref 65–99)
GLUCOSE-CAPILLARY: 79 mg/dL (ref 65–99)
GLUCOSE-CAPILLARY: 113 mg/dL — AB (ref 65–99)
Glucose-Capillary: 86 mg/dL (ref 65–99)
Glucose-Capillary: 98 mg/dL (ref 65–99)
Glucose-Capillary: 88 mg/dL (ref 65–99)

## 2017-06-07 LAB — RENAL FUNCTION PANEL
Albumin: 2.7 g/dL — ABNORMAL LOW (ref 3.5–5.0)
Anion gap: 6 (ref 5–15)
BUN: 12 mg/dL (ref 6–20)
CHLORIDE: 105 mmol/L (ref 101–111)
CO2: 23 mmol/L (ref 22–32)
CREATININE: 0.61 mg/dL (ref 0.61–1.24)
Calcium: 8.8 mg/dL — ABNORMAL LOW (ref 8.9–10.3)
GFR calc Af Amer: >60 mL/min (ref >60)
GFR calc non Af Amer: >60 mL/min (ref >60)
Glucose, Bld: 81 mg/dL (ref 65–99)
Phosphorus: 5.7 mg/dL — ABNORMAL HIGH (ref 2.5–4.6)
Potassium: 4.2 mmol/L (ref 3.5–5.1)
Sodium: 134 mmol/L — ABNORMAL LOW (ref 135–145)

## 2017-06-07 LAB — MAGNESIUM: MAGNESIUM: 2 mg/dL (ref 1.7–2.4)

## 2017-06-07 MED ORDER — MAGNESIUM SULFATE 50 % IJ SOLN
6.0000 g | Freq: Once | INTRAVENOUS | Status: AC
  Administered 2017-06-07: 6 g via INTRAVENOUS
  Filled 2017-06-07: qty 12

## 2017-06-07 NOTE — Progress Notes (Signed)
Pharmacy Consult Note:  Electrolyte Replacement  26 yom currently on treatment for cryptococcal meningitis and strep pneumo bacteremia. Pharmacy consulted by ID to manage electrolyte replacement. SCr remains stable.  K 4.2 on KCl 40meq daily Mg up to 2 << 1.7 s/p a total of 8g IV replacement yesterday  Despite Magnesium being at goal - will still schedule a dose today to maintain levels given the patient has been requiring an average of 6g/day while on Ambisome.  Plan: Continue KCl 40 mEq daily Magnesium 6g IV x 1 dose today to help maintain therapeutic range Continue magnesium oxide 400 mg PT twice daily F/u AM lytes  Thank you for allowing pharmacy to be a part of this patient's care.  Georgina PillionElizabeth Carmita Boom, PharmD, BCPS Clinical Pharmacist Pager: [phone] Clinical phone for 06/07/2017 from 7a-3:30p: [phone] If after 3:30p, please call main pharmacy at: [phone] 06/07/2017 12:19 PM

## 2017-06-07 NOTE — Progress Notes (Signed)
No issues overnight.   EXAM:  BP 122/78 (BP Location: Right Arm)   Pulse 90   Temp 97.7 F (36.5 C) (Axillary)   Resp 14   Ht 6\' 1"  (1.854 m)   Wt 63.2 kg (139 lb 5.3 oz)   SpO2 100%   BMI 18.38 kg/m   Awake, alert, oriented to person CN grossly intact  Moves all extremities EVD site dry  IMPRESSION:  26 y.o. male with cryptococcal and S. Epi meningitis. Has had periods of lethargy and decreased responsiveness after EVD removed which seemed to improve with LP and CSF drainage suggesting he may need more permanent VPS.  Spoke with Dr. Luciana Axeomer regarding timing of shunt from an ID standpoint. Seems reasonable to proceed with shunt when most recent CSF culture is negative for fungal/bacterial growth.  PLAN: - Will proceed with VPS when CSF culture is confirmed negative, likely Mon or Tues next week

## 2017-06-07 NOTE — Progress Notes (Signed)
Nutrition Follow-up  DOCUMENTATION CODES:   Non-severe (moderate) malnutrition in context of chronic illness  Underweight  INTERVENTION:    Given variability of alertness, would continue TF via Cortrak tube: Vital AF 1.2 at 75 ml/h (2160 kcal, 135 gm protein, 1460 ml free water daily)  When alertness improves and patient is able to take PO's, can change to nocturnal feedings: Vital AF 1.2 at 90 ml/h x 12 hours would provide 1296 kcal, 81 gm protein, 876 ml free water (60% of kcal needs and 80% of protein needs).  NUTRITION DIAGNOSIS:   Malnutrition (Moderate) related to chronic illness (AIDS) as evidenced by mild depletion of muscle mass, mild depletion of body fat.  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  Patient is currently receiving Vital AF 1.2 via Cortrak at 75 ml/h, providing 2160 kcals, 135 gm protein, 1460 ml free water daily.  Also receiving dysphagia 2 diet with thin liquids; documented 0-50% meal completion. SLP following. Patient not alert enough to eat breakfast today. Labs reviewed: sodium 134 (L), phosphorus 5.7 (H) Medications reviewed and include KCl and Mag-Ox.  Diet Order:  DIET DYS 2 Room service appropriate? Yes; Fluid consistency: Thin  Skin:  Reviewed, no issues  Last BM:  6/13  Height:   Ht Readings from Last 1 Encounters:  05/20/17 '6\' 1"'  (1.854 m)    Weight:   Wt Readings from Last 1 Encounters:  06/07/17 139 lb 5.3 oz (63.2 kg)    Ideal Body Weight:  83.6 kg  BMI:  Body mass index is 18.38 kg/m.  Estimated Nutritional Needs:    Kcal:  2100-2300  Protein:  100-125 grams  Fluid:  >/= 2.1 L/day  EDUCATION NEEDS:   No education needs identified at this time  Molli Barrows, [place], [place], [place] Pager [phone] After Hours Pager [phone]

## 2017-06-07 NOTE — Progress Notes (Signed)
PT Cancellation Note  Patient Details Name: Justin Ball MRN: [id number] DOB: 04/30/1991   Cancelled Treatment:    Reason Eval/Treat Not Completed: Fatigue/lethargy limiting ability to participate Pt too lethargic to participate due to medication. PT will continue to follow acutely.   Justin Ball, PTA Pager: [phone]   06/07/2017, 2:22 PM

## 2017-06-07 NOTE — Progress Notes (Signed)
PROGRESS NOTE    JAKOBIE HENSLEE  ZOX:[id number] DOB: 1991/08/14 DOA: 05/20/2017 PCP: Randall Hiss, MD     Brief Narrative:  Justin Ball is a 26 y.o. male with a history of AIDS, noncompliance, syphilis, bipolar disorder who presented on 5/27 after being pulled from his car unresponsive with concern for new-onset seizures, postictal on arrival. He had been seen in ED 3 days prior with headaches, and discharged afebrile, well-appearing with negative neuroimaging. He was admitted to the ICU, intubated. LP performed demonstrated yeast and he was treated with amphotericin and flucytosine, in addition to vancomycin and zosyn, for cryptococcal meningitis. EEG showed PLEDs, keppra started. The day after admission the patient demonstrated a fixed right pupil and posturing. Mannitol was started, CT head negative, and ventriculostomy was placed by neurosurgery due to concern for herniation. Blood cultures grew positive with S. pneumoniae. CSF culture later grew S. epidermidis and linezolid was added on 6/6. He was extubated 6/2 and mental status is noted to still be altered from baseline. The patient was transferred to SDU and weaned from precedex on 6/8 to the Hospitalists service 6/9. Neurology, neurosurgery, and ID are consulting, and plan is for discharge to CIR once patient is stable and able to tolerate 3 hours of therapy daily.    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  -  Continue amphotericin and flucytosine per ID - Continue linezolid x14 days (6/6 - last dose on 6/19) - Continue keppra per neurology for seizure Tx - EVD removed 6/8  - LP repeated 6/12 with opening pressure 27 - Plan for VPS placement  - Appreciate neurology, neurosurgery, ID expertise   Acute encephalopathy: Multifactorial with meningoencephalitis, seizures, ischemic infarcts (no antiplatelet per neuro), metabolic derangements and medications; likely complicated by delirium. - Numerous scattered areas of ischemia on MRI - Treat underlying conditions. - Ativan prn agitation, off precedex since 6/8 - Avoid haldol with seizure disorder  - SLP evaluating, giving dysphagia 2 diet in addition to TF's  - Continues to be altered  Streptococcus pneumoniae bacteremia - Linezolid per ID x14 days (6/6 - last dose on 6/19) - TTE ordered: No vegetations noted   Bordatella pneumonia - Was treated with doxycycline per ID, now off   AIDS: CD4 count April 2018 was 2.  - Hold ART per ID - Continue bactrim daily and weekly azithromycin (given 6/7) ppx  Neutropenia: Suspected due to flucytosine.  - Monitor.  Diarrhea: Acute, likely from tube feeds.  - Flexiseal - Anti-diarrheals ok  Chronic hepatitis B - Follow up with ID   DVT prophylaxis: SCDs Code Status: Full Family Communication: None at bedside Disposition Plan: Eventual DC to CIR once able to tolerate therapy for 3 hours daily. Plan for VPS    Consultants:   PCCM primary 5/27 - 6/8  Neurology  Neurosurgery  ID  Procedures:   OETT 5/27 - 6/3  OGT 5/27 - 6/3  NGT >>>   Right ventriculostomy 5/28 - 6/8  LUE TL  PICC 6/3 >>>  Foley 6/4 >>>  Flexiseal 6/7  LP 6/12  Echocardiogram 06/02/2017: - Left ventricle: The cavity size was normal. Wall thickness was normal. Systolic function was vigorous. The estimated ejection fraction was in the range of 65% to 70%. Left ventricular diastolic function  parameters were normal. - Atrial septum: No defect or patent foramen ovale was identified.  Impressions: - No cardiac source of emboli was indentified.  Antimicrobials:  Anti-infectives    Start     Dose/Rate Route Frequency Ordered Stop   05/31/17 1000  azithromycin (ZITHROMAX) 200 MG/5ML suspension 1,200 mg     1,200 mg Per Tube Weekly 05/24/17 1315     05/30/17 1130  linezolid (ZYVOX) IVPB 600 mg     600 mg 300 mL/hr over 60 Minutes Intravenous Every 12 hours 05/30/17 1127     05/26/17 1215  doxycycline (VIBRAMYCIN) 100 mg in dextrose 5 % 250 mL IVPB  Status:  Discontinued     100 mg 125 mL/hr over 120 Minutes Intravenous Every 12 hours 05/26/17 1212 06/01/17 1448   05/26/17 0554  cefTRIAXone (ROCEPHIN) 2 g in dextrose 5 % 50 mL IVPB  Status:  Discontinued     2 g 100 mL/hr over 30 Minutes Intravenous Every 24 hours 05/25/17 1358 05/31/17 1323   05/24/17 1230  azithromycin (ZITHROMAX) 1,200 mg in dextrose 5 % 250 mL IVPB     1,200 mg 250 mL/hr over 60 Minutes Intravenous Weekly 05/24/17 1128 05/24/17 1407   05/21/17 0800  emtricitabine-rilpivir-tenofovir AF (ODEFSEY) 200-25-25 MG per tablet 1 tablet  Status:  Discontinued     1 tablet Oral Daily with breakfast 05/20/17 1813 05/20/17 1909   05/21/17 0600  vancomycin (VANCOCIN) IVPB 1000 mg/200 mL premix  Status:  Discontinued     1,000 mg 200 mL/hr over 60 Minutes Intravenous Every 8 hours 05/21/17 0549 05/21/17 1400   05/21/17 0545  cefTRIAXone (ROCEPHIN) 2 g in dextrose 5 % 50 mL IVPB  Status:  Discontinued     2 g 100 mL/hr over 30 Minutes Intravenous Every 12 hours 05/21/17 0531 05/25/17 1358   05/21/17 0100  cefTRIAXone (ROCEPHIN) 2 g in dextrose 5 % 50 mL IVPB  Status:  Discontinued     2 g 100 mL/hr over 30 Minutes Intravenous Every 12 hours 05/20/17 1245 05/20/17 1919   05/20/17 2100  vancomycin (VANCOCIN) IVPB 1000 mg/200 mL premix  Status:  Discontinued     1,000 mg 200 mL/hr over 60 Minutes Intravenous Every 8 hours  05/20/17 1245 05/20/17 1918   05/20/17 2000  ampicillin (OMNIPEN) 1 g in sodium chloride 0.9 % 50 mL IVPB  Status:  Discontinued     1 g 150 mL/hr over 20 Minutes Intravenous Every 4 hours 05/20/17 1833 05/20/17 1836   05/20/17 2000  ampicillin (OMNIPEN) 2 g in sodium chloride 0.9 % 50 mL IVPB  Status:  Discontinued     2 g 150 mL/hr over 20 Minutes Intravenous Every 4 hours 05/20/17 1836 05/20/17 1919   05/20/17 2000  sulfamethoxazole-trimethoprim (BACTRIM,SEPTRA) 200-40 MG/5ML suspension 10 mL     10 mL Per Tube Daily 05/20/17 1901     05/20/17 2000  emtricitabine-tenofovir AF (DESCOVY) 200-25 MG per tablet 1 tablet  Status:  Discontinued     1 tablet Oral Daily 05/20/17 1909 05/20/17 1919   05/20/17 2000  rilpivirine (EDURANT) tablet 25 mg  Status:  Discontinued     25 mg Per Tube Daily with breakfast 05/20/17 1909 05/20/17 1919  05/20/17 2000  flucytosine (ANCOBON) capsule 1,500 mg     1,500 mg Per Tube Every 6 hours 05/20/17 1926     05/20/17 1815  dolutegravir (TIVICAY) tablet 50 mg  Status:  Discontinued     50 mg Oral Daily 05/20/17 1813 05/20/17 1919   05/20/17 1815  sulfamethoxazole-trimethoprim (BACTRIM,SEPTRA) 400-80 MG per tablet 1 tablet  Status:  Discontinued     1 tablet Oral Daily 05/20/17 1813 05/20/17 1901   05/20/17 1800  flucytosine (ANCOBON) capsule 1,500 mg  Status:  Discontinued     25 mg/kg  63.5 kg Per Tube Every 6 hours 05/20/17 1645 05/20/17 1926   05/20/17 1700  amphotericin B liposome (AMBISOME) 250 mg in dextrose 5 % 500 mL IVPB     4 mg/kg  63.5 kg 250 mL/hr over 120 Minutes Intravenous Every 24 hours 05/20/17 1645     05/20/17 1300  ampicillin (OMNIPEN) 2 g in sodium chloride 0.9 % 50 mL IVPB  Status:  Discontinued     2 g 150 mL/hr over 20 Minutes Intravenous  Once 05/20/17 1237 05/20/17 1242   05/20/17 1300  acyclovir (ZOVIRAX) 635 mg in dextrose 5 % 100 mL IVPB  Status:  Discontinued     10 mg/kg  63.5 kg 112.7 mL/hr over 60 Minutes Intravenous  Every 8 hours 05/20/17 1243 05/20/17 1918   05/20/17 1245  cefTRIAXone (ROCEPHIN) 2 g in dextrose 5 % 50 mL IVPB     2 g 100 mL/hr over 30 Minutes Intravenous  Once 05/20/17 1237 05/20/17 1327   05/20/17 1245  vancomycin (VANCOCIN) IVPB 1000 mg/200 mL premix     1,000 mg 200 mL/hr over 60 Minutes Intravenous  Once 05/20/17 1237 05/20/17 1420      Subjective: Alert today, but not verbal. Does not answer questions or follow commands.   Objective: Vitals:   06/06/17 2257 06/07/17 0344 06/07/17 0413 06/07/17 0732  BP: (!) 150/85 122/78    Pulse: (!) 117 90    Resp: 17 14    Temp: 97.6 F (36.4 C) 97.7 F (36.5 C)  97.7 F (36.5 C)  TempSrc: Axillary Axillary  Axillary  SpO2: 100% 100%    Weight:   63.2 kg (139 lb 5.3 oz)   Height:        Intake/Output Summary (Last 24 hours) at 06/07/17 1038 Last data filed at 06/07/17 0848  Gross per 24 hour  Intake             4555 ml  Output             7775 ml  Net            -3220 ml   Filed Weights   06/05/17 0505 06/06/17 0517 06/07/17 0413  Weight: 66.4 kg (146 lb 6.2 oz) 64.5 kg (142 lb 3.2 oz) 63.2 kg (139 lb 5.3 oz)    Examination:  General exam: Appears calm this morning  Respiratory system: Clear to auscultation. Respiratory effort normal. Cardiovascular system: S1 & S2 heard, tachycardic, regular rhythm. No JVD, murmurs, rubs, gallops or clicks. No pedal edema. Gastrointestinal system: Abdomen is nondistended, soft and nontender. No organomegaly or masses felt. Normal bowel sounds heard. +NG in place, +Flexiseal in place  Central nervous system: Alert, does not follow command, pupils equal and reactive to light bilaterally  Extremities: Symmetric in appearance Skin: No rashes, lesions or ulcers  Data Reviewed: I have personally reviewed following labs and imaging studies  CBC:  Recent Labs Lab  06/03/17 0404 06/04/17 0516 06/05/17 0258 06/06/17 0358 06/07/17 0420  WBC 2.0* 2.1* 1.9* 2.2* 2.3*  NEUTROABS 1.3* 1.2*  1.2* 1.3* 1.5*  HGB 9.3* 10.4* 9.6* 10.2* 10.3*  HCT 28.7* 30.7* 30.2* 31.1* 31.3*  MCV 90.5 91.4 91.5 90.1 89.4  PLT 371 352 330 338 294   Basic Metabolic Panel:  Recent Labs Lab 06/02/17 0537 06/03/17 0404 06/04/17 0516 06/05/17 0258 06/06/17 0358 06/07/17 0420 06/07/17 0722  NA 132* 135  135 135 133* 134*  --  134*  K 4.1 4.1  4.1 3.9 4.3 4.2  --  4.2  CL 106 107  107 105 106 105  --  105  CO2 21* 23  23 26 22 22   --  23  GLUCOSE 136* 132*  131* 98 94 89  --  81  BUN 10 11  11 15 14 10   --  12  CREATININE 0.56* 0.62  0.62 0.65 0.65 0.68  --  0.61  CALCIUM 8.7* 8.7*  8.6* 8.9 8.8* 8.7*  --  8.8*  MG 1.4* 1.3* 1.6* 1.7 1.7 2.0  --   PHOS 4.3 5.1*  --  6.0* 6.1*  --  5.7*   GFR: Estimated Creatinine Clearance: 125.1 mL/min (by C-G formula based on SCr of 0.61 mg/dL). Liver Function Tests:  Recent Labs Lab 06/03/17 0404 06/04/17 0516 06/05/17 0258 06/06/17 0358 06/07/17 0722  AST 73* 81*  --   --   --   ALT 81* 90*  --   --   --   ALKPHOS 183* 190*  --   --   --   BILITOT 0.3 0.4  --   --   --   PROT 7.5 7.6  --   --   --   ALBUMIN 2.3*  2.4* 2.5* 2.6* 2.7* 2.7*   No results for input(s): LIPASE, AMYLASE in the last 168 hours. No results for input(s): AMMONIA in the last 168 hours. Coagulation Profile: No results for input(s): INR, PROTIME in the last 168 hours. Cardiac Enzymes: No results for input(s): CKTOTAL, CKMB, CKMBINDEX, TROPONINI in the last 168 hours. BNP (last 3 results) No results for input(s): PROBNP in the last 8760 hours. HbA1C: No results for input(s): HGBA1C in the last 72 hours. CBG:  Recent Labs Lab 06/06/17 1605 06/06/17 1953 06/06/17 2258 06/07/17 0346 06/07/17 0734  GLUCAP 97 105* 77 86 98   Lipid Profile: No results for input(s): CHOL, HDL, LDLCALC, TRIG, CHOLHDL, LDLDIRECT in the last 72 hours. Thyroid Function Tests: No results for input(s): TSH, T4TOTAL, FREET4, T3FREE, THYROIDAB in the last 72 hours. Anemia  Panel: No results for input(s): VITAMINB12, FOLATE, FERRITIN, TIBC, IRON, RETICCTPCT in the last 72 hours. Sepsis Labs: No results for input(s): PROCALCITON, LATICACIDVEN in the last 168 hours.  Recent Results (from the past 240 hour(s))  CSF culture with Stat gram stain     Status: None   Collection Time: 05/29/17 12:19 PM  Result Value Ref Range Status   Specimen Description CSF  Final   Special Requests NONE  Final   Gram Stain   Final    NO WBC SEEN YEAST CORRECTED RESULTS PREVIOUSLY REPORTED AS: NO ORGANISMS SEEN CORRECTED RESULTS CALLED TO: M HARPER,RN AT 1525 05/29/17 BY L BENFIELD    Culture   Final    FEW STAPHYLOCOCCUS EPIDERMIDIS CRITICAL RESULT CALLED TO, READ BACK BY AND VERIFIED WITH: DR Drue Second 05/30/17 @ 1119 M VESTAL    Report Status 06/05/2017 FINAL  Final  Organism ID, Bacteria STAPHYLOCOCCUS EPIDERMIDIS  Final      Susceptibility   Staphylococcus epidermidis - MIC*    CIPROFLOXACIN <=0.5 SENSITIVE Sensitive     ERYTHROMYCIN >=8 RESISTANT Resistant     GENTAMICIN <=0.5 SENSITIVE Sensitive     OXACILLIN >=4 RESISTANT Resistant     TETRACYCLINE 2 SENSITIVE Sensitive     VANCOMYCIN 2 SENSITIVE Sensitive     TRIMETH/SULFA >=320 RESISTANT Resistant     CLINDAMYCIN >=8 RESISTANT Resistant     RIFAMPIN <=0.5 SENSITIVE Sensitive     Inducible Clindamycin NEGATIVE Sensitive     * FEW STAPHYLOCOCCUS EPIDERMIDIS  CSF culture     Status: None (Preliminary result)   Collection Time: 06/05/17  9:39 AM  Result Value Ref Range Status   Specimen Description CSF  Final   Special Requests Immunocompromised  Final   Gram Stain   Final    WBC PRESENT, PREDOMINANTLY MONONUCLEAR BUDDING YEAST SEEN CRITICAL RESULT CALLED TO, READ BACK BY AND VERIFIED WITH: Buckman RN 10:20 06/05/17 ([email or web address])    Culture NO GROWTH 1 DAY  Final   Report Status PENDING  Incomplete       Radiology Studies: Dg Chest Port 1 View  Result Date: 06/06/2017 CLINICAL DATA:  PICC placement EXAM:  PORTABLE CHEST 1 VIEW COMPARISON:  05/27/2017 FINDINGS: Left arm PICC tip is at the SVC RA junction. Soft feeding tube enters the abdomen. The lungs are clear. No effusions. IMPRESSION: Left arm PICC tip at the SVC RA junction.  Lungs clear. Electronically Signed   By: Paulina Fusi M.D.   On: 06/06/2017 13:21      Scheduled Meds: . acetaminophen  650 mg Oral Q24H  . azithromycin  1,200 mg Per Tube Weekly  . chlorhexidine  15 mL Mouth Rinse BID  . Chlorhexidine Gluconate Cloth  6 each Topical Daily  . diphenhydrAMINE  25 mg Oral Daily  . flucytosine  1,500 mg Per Tube Q6H  . insulin aspart  2-6 Units Subcutaneous Q4H  . lidocaine (PF)  10 mL Intradermal Once  . magnesium oxide  400 mg Per Tube BID  . mouth rinse  15 mL Mouth Rinse q12n4p  . metoprolol tartrate  5 mg Intravenous Q8H  . potassium chloride  40 mEq Oral Daily  . sulfamethoxazole-trimethoprim  10 mL Per Tube Daily   Continuous Infusions: . sodium chloride Stopped (06/05/17 0200)  . amphotericin  B  Liposome (AMBISOME) ADULT IV Stopped (06/06/17 1941)  . dextrose 5 % and 0.45% NaCl 50 mL/hr at 06/07/17 0600  . feeding supplement (VITAL AF 1.2 CAL) 1,000 mL (06/07/17 0600)  . levETIRAcetam 1,000 mg (06/07/17 0937)  . linezolid (ZYVOX) IV 600 mg (06/07/17 0936)  . sodium chloride    . sodium chloride       LOS: 18 days    Time spent: 30 minutes   Noralee Stain, DO [facility] [email or web address] Password [facility] 06/07/2017, 10:38 AM

## 2017-06-07 NOTE — Progress Notes (Signed)
[facility]    Date of Admission:  05/20/2017     Reason for visit:  Follow up on cryptococcal meningitis   Subjective: Looks great today!! He was alert and able to answer my questions with one word answers. Reports his head does hurt a little bit when I asked if having headaches. Denies any other pains. He was able to follow all commands. Able to say name, year, and in hospital.   Medications:  . acetaminophen  650 mg Oral Q24H  . azithromycin  1,200 mg Per Tube Weekly  . chlorhexidine  15 mL Mouth Rinse BID  . Chlorhexidine Gluconate Cloth  6 each Topical Daily  . diphenhydrAMINE  25 mg Oral Daily  . flucytosine  1,500 mg Per Tube Q6H  . insulin aspart  2-6 Units Subcutaneous Q4H  . lidocaine (PF)  10 mL Intradermal Once  . magnesium oxide  400 mg Per Tube BID  . mouth rinse  15 mL Mouth Rinse q12n4p  . metoprolol tartrate  5 mg Intravenous Q8H  . potassium chloride  40 mEq Oral Daily  . sulfamethoxazole-trimethoprim  10 mL Per Tube Daily    Objective: Vital signs in last 24 hours: Temp:  [97.6 F (36.4 C)-98.3 F (36.8 C)] 97.7 F (36.5 C) (06/14 0732) Pulse Rate:  [90-122] 101 (06/14 0732) Resp:  [14-23] 14 (06/14 0344) BP: (113-150)/(70-85) 113/78 (06/14 0732) SpO2:  [98 %-100 %] 98 % (06/14 0732) Weight:  [139 lb 5.3 oz (63.2 kg)] 139 lb 5.3 oz (63.2 kg) (06/14 0413)  General: Young man sitting up in bed, alert, NAD Neuro: alert and oriented to person, hospital, and year. Followed all commands. Significant improvement in mental status.  Lab Results  Recent Labs  06/06/17 0358 06/07/17 0420 06/07/17 0722  WBC 2.2* 2.3*  --   HGB 10.2* 10.3*  --   HCT 31.1* 31.3*  --   NA 134*  --  134*  K 4.2  --  4.2  CL 105  --  105  CO2 22  --  23  BUN 10  --  12  CREATININE 0.68  --  0.61   Liver Panel  Recent Labs  06/06/17 0358 06/07/17 0722  ALBUMIN 2.7* 2.7*    Studies/Results: Dg Chest Port 1 View  Result Date:  06/06/2017 CLINICAL DATA:  PICC placement EXAM: PORTABLE CHEST 1 VIEW COMPARISON:  05/27/2017 FINDINGS: Left arm PICC tip is at the SVC RA junction. Soft feeding tube enters the abdomen. The lungs are clear. No effusions. IMPRESSION: Left arm PICC tip at the SVC RA junction.  Lungs clear. Electronically Signed   By: Paulina FusiMark  Shogry M.D.   On: 06/06/2017 13:21     Assessment/Plan:  Increased intracranial pressure: His mental status is the best I have seen since his admission. May need to reconsider need for more permanent VPS if his mental status continues to improve. Will need to have repeat LP to assess opening pressure and also obtain new CSF culture to ensure yeast is clearing.   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   Staph epi meningitis:  Continue Linezolid, day 9.  Leukopenia: Stable. Likely related to Flucytosine and Linezolid.  HIV: Hold ART with cryptococcal meningitis and risk of IRIS.  Encephalopathy: Improved significantly. Hopefully will continue to improve as he works with PT/OT/SLP.   Attending note to follow  Rich Numberarly Rivet, MD, MPH Internal Medicine Resident, PGY-3 06/07/2017, 10:52 AM

## 2017-06-07 NOTE — Progress Notes (Signed)
I contacted financial counselor, Fernande Boydendam Byrd, to request Disability and Medicaid applications for Mom clarified with pt's employer, his insurance will terminate 06/18/17. I also contacted pt's case manager, Dwain SarnaMitch McGee at [phone] to request emergency ADAP for future need of his HIV meds. Grandmother has this months meds at their home and storing them at this time. I await VP shunt placement and pt's functional progress to assist with planning dispo of either CIR or SNF depending on his progress. [phone]

## 2017-06-07 NOTE — Progress Notes (Signed)
SLP Cancellation Note  Patient Details Name: Justin Ball MRN: [id number] DOB: 11/22/1991   Cancelled treatment:       Reason Eval/Treat Not Completed: Fatigue/lethargy limiting ability to participate. Pt not sufficiently arousable to attempt am meal despite efforts.    Seleny Allbright, Riley NearingBonnie Caroline 06/07/2017, 9:31 AM

## 2017-06-08 LAB — CBC WITH DIFFERENTIAL/PLATELET
Basophils Absolute: 0 10*3/uL (ref 0.0–0.1)
Basophils Relative: 1 %
EOS PCT: 4 %
Eosinophils Absolute: 0.1 10*3/uL (ref 0.0–0.7)
HCT: 32.1 % — ABNORMAL LOW (ref 39.0–52.0)
Hemoglobin: 10.6 g/dL — ABNORMAL LOW (ref 13.0–17.0)
LYMPHS ABS: 0.7 10*3/uL (ref 0.7–4.0)
LYMPHS PCT: 27 %
MCH: 29.5 pg (ref 26.0–34.0)
MCHC: 33 g/dL (ref 30.0–36.0)
MCV: 89.4 fL (ref 78.0–100.0)
MONO ABS: 0.2 10*3/uL (ref 0.1–1.0)
MONOS PCT: 9 %
Neutro Abs: 1.5 10*3/uL — ABNORMAL LOW (ref 1.7–7.7)
Neutrophils Relative %: 59 %
PLATELETS: 274 10*3/uL (ref 150–400)
RBC: 3.59 MIL/uL — ABNORMAL LOW (ref 4.22–5.81)
RDW: 13.8 % (ref 11.5–15.5)
WBC: 2.6 10*3/uL — ABNORMAL LOW (ref 4.0–10.5)

## 2017-06-08 LAB — CSF CELL COUNT WITH DIFFERENTIAL
RBC Count, CSF: 1 /mm3 — ABNORMAL HIGH
RBC Count, CSF: 2 /mm3 — ABNORMAL HIGH
TUBE #: 1
Tube #: 4
WBC CSF: 0 /mm3 (ref 0–5)
WBC, CSF: 6 /mm3 — ABNORMAL HIGH (ref 0–5)

## 2017-06-08 LAB — RENAL FUNCTION PANEL
Albumin: 2.7 g/dL — ABNORMAL LOW (ref 3.5–5.0)
Anion gap: 7 (ref 5–15)
BUN: 11 mg/dL (ref 6–20)
CHLORIDE: 103 mmol/L (ref 101–111)
CO2: 24 mmol/L (ref 22–32)
CREATININE: 0.63 mg/dL (ref 0.61–1.24)
Calcium: 8.9 mg/dL (ref 8.9–10.3)
GFR calc non Af Amer: >60 mL/min (ref >60)
Glucose, Bld: 101 mg/dL — ABNORMAL HIGH (ref 65–99)
Phosphorus: 5.9 mg/dL — ABNORMAL HIGH (ref 2.5–4.6)
Potassium: 4 mmol/L (ref 3.5–5.1)
Sodium: 134 mmol/L — ABNORMAL LOW (ref 135–145)

## 2017-06-08 LAB — GLUCOSE, CAPILLARY
GLUCOSE-CAPILLARY: 91 mg/dL (ref 65–99)
GLUCOSE-CAPILLARY: 85 mg/dL (ref 65–99)
GLUCOSE-CAPILLARY: 71 mg/dL (ref 65–99)
GLUCOSE-CAPILLARY: 86 mg/dL (ref 65–99)
Glucose-Capillary: 76 mg/dL (ref 65–99)
Glucose-Capillary: 90 mg/dL (ref 65–99)

## 2017-06-08 LAB — MAGNESIUM: Magnesium: 2.1 mg/dL (ref 1.7–2.4)

## 2017-06-08 LAB — PROTEIN AND GLUCOSE, CSF
Glucose, CSF: 44 mg/dL (ref 40–70)
Total  Protein, CSF: 90 mg/dL — ABNORMAL HIGH (ref 15–45)

## 2017-06-08 MED ORDER — LIDOCAINE HCL (PF) 1 % IJ SOLN
5.0000 mL | Freq: Once | INTRAMUSCULAR | Status: AC
  Administered 2017-06-08: 5 mL

## 2017-06-08 MED ORDER — DEXTROSE 5 % IV SOLN
5.0000 g | Freq: Once | INTRAVENOUS | Status: AC
  Administered 2017-06-08: 5 g via INTRAVENOUS
  Filled 2017-06-08: qty 10

## 2017-06-08 NOTE — Progress Notes (Deleted)
PROGRESS NOTE    Justin Ball  QIO:[id number] DOB: 04/13/1991 DOA: 05/20/2017 PCP: Randall HissVan Dam, [name], MD     Brief Narrative:  Justin Ball is a 26 y.o. male with a history of AIDS, noncompliance, syphilis, bipolar disorder who presented on 5/27 after being pulled from his car unresponsive with concern for new-onset seizures, postictal on arrival. He had been seen in ED 3 days prior with headaches, and discharged afebrile, well-appearing with negative neuroimaging. He was admitted to the ICU, intubated. LP performed demonstrated yeast and he was treated with amphotericin and flucytosine, in addition to vancomycin and zosyn, for cryptococcal meningitis. EEG showed PLEDs, keppra started. The day after admission the patient demonstrated a fixed right pupil and posturing. Mannitol was started, CT head negative, and ventriculostomy was placed by neurosurgery due to concern for herniation. Blood cultures grew positive with S. pneumoniae. CSF culture later grew S. epidermidis and linezolid was added on 6/6. He was extubated 6/2 and mental status is noted to still be altered from baseline. The patient was transferred to SDU and weaned from precedex on 6/8 to the Hospitalists service 6/9. Neurology, neurosurgery, and ID are consulting, and plan is for discharge to CIR once patient is stable and able to tolerate 3 hours of therapy daily.    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  -  Continue amphotericin and flucytosine per ID - Continue linezolid x14 days (6/6 - last dose on 6/19) - Continue keppra per neurology for seizure Tx - EVD removed 6/8  - LP repeated 6/12 with opening pressure 27 - LP today 6/15 - Plan for VPS placement, when repeat CSF culture negative, likely next week Monday or Tuesday  - Appreciate neurology, neurosurgery, ID expertise   Acute encephalopathy: Multifactorial with meningoencephalitis, seizures, ischemic infarcts (no antiplatelet per neuro), metabolic derangements and medications; likely complicated by delirium. - Numerous scattered areas of ischemia on MRI - Treat underlying conditions. - Ativan prn agitation, off precedex since 6/8 - Avoid haldol with seizure disorder  - SLP evaluating, giving dysphagia 2 diet in addition to TF's  - Continues to be altered, although improved in exam today   Streptococcus pneumoniae bacteremia - Linezolid per ID x14 days (6/6 - last dose on 6/19) - TTE ordered: No vegetations noted   Bordatella pneumonia - Was treated with doxycycline per ID, now off   AIDS: CD4 count April 2018 was 2.  - Hold ART per ID - Continue bactrim daily and weekly azithromycin (given 6/7) ppx  Neutropenia: Suspected due to flucytosine.  - Monitor.  Diarrhea: Acute, likely from tube feeds.  - Flexiseal - Anti-diarrheals ok  Chronic hepatitis B - Follow up with ID   DVT prophylaxis: SCDs Code Status: Full Family Communication: None at bedside Disposition Plan: Eventual DC to CIR once able to tolerate therapy for 3 hours daily. Plan for VPS next week    Consultants:   PCCM primary 5/27 - 6/8  Neurology  Neurosurgery  ID  Procedures:  OETT 5/27 - 6/3  OGT 5/27 - 6/3  NGT >>>   Right ventriculostomy 5/28 - 6/8  LUE TL PICC 6/3 >>>  Foley 6/4 >>>  Flexiseal 6/7  LP 6/12  Echocardiogram 06/02/2017: - Left ventricle: The cavity size was normal. Wall thickness was normal. Systolic  function was vigorous. The estimated ejection fraction was in the range of 65% to 70%. Left ventricular diastolic function parameters were normal. - Atrial septum: No defect or patent foramen ovale was identified.  Impressions: - No cardiac source of emboli was indentified.  Antimicrobials:  Anti-infectives    Start     Dose/Rate Route Frequency Ordered Stop   05/31/17 1000  azithromycin (ZITHROMAX) 200 MG/5ML suspension 1,200 mg     1,200 mg Per Tube Weekly 05/24/17 1315     05/30/17 1130  linezolid (ZYVOX) IVPB 600 mg     600 mg 300 mL/hr over 60 Minutes Intravenous Every 12 hours 05/30/17 1127     05/26/17 1215  doxycycline (VIBRAMYCIN) 100 mg in dextrose 5 % 250 mL IVPB  Status:  Discontinued     100 mg 125 mL/hr over 120 Minutes Intravenous Every 12 hours 05/26/17 1212 06/01/17 1448   05/26/17 0554  cefTRIAXone (ROCEPHIN) 2 g in dextrose 5 % 50 mL IVPB  Status:  Discontinued     2 g 100 mL/hr over 30 Minutes Intravenous Every 24 hours 05/25/17 1358 05/31/17 1323   05/24/17 1230  azithromycin (ZITHROMAX) 1,200 mg in dextrose 5 % 250 mL IVPB     1,200 mg 250 mL/hr over 60 Minutes Intravenous Weekly 05/24/17 1128 05/24/17 1407   05/21/17 0800  emtricitabine-rilpivir-tenofovir AF (ODEFSEY) 200-25-25 MG per tablet 1 tablet  Status:  Discontinued     1 tablet Oral Daily with breakfast 05/20/17 1813 05/20/17 1909   05/21/17 0600  vancomycin (VANCOCIN) IVPB 1000 mg/200 mL premix  Status:  Discontinued     1,000 mg 200 mL/hr over 60 Minutes Intravenous Every 8 hours 05/21/17 0549 05/21/17 1400   05/21/17 0545  cefTRIAXone (ROCEPHIN) 2 g in dextrose 5 % 50 mL IVPB  Status:  Discontinued     2 g 100 mL/hr over 30 Minutes Intravenous Every 12 hours 05/21/17 0531 05/25/17 1358   05/21/17 0100  cefTRIAXone (ROCEPHIN) 2 g in dextrose 5 % 50 mL IVPB  Status:  Discontinued     2 g 100 mL/hr over 30 Minutes Intravenous Every 12 hours 05/20/17 1245 05/20/17 1919   05/20/17 2100  vancomycin  (VANCOCIN) IVPB 1000 mg/200 mL premix  Status:  Discontinued     1,000 mg 200 mL/hr over 60 Minutes Intravenous Every 8 hours 05/20/17 1245 05/20/17 1918   05/20/17 2000  ampicillin (OMNIPEN) 1 g in sodium chloride 0.9 % 50 mL IVPB  Status:  Discontinued     1 g 150 mL/hr over 20 Minutes Intravenous Every 4 hours 05/20/17 1833 05/20/17 1836   05/20/17 2000  ampicillin (OMNIPEN) 2 g in sodium chloride 0.9 % 50 mL IVPB  Status:  Discontinued     2 g 150 mL/hr over 20 Minutes Intravenous Every 4 hours 05/20/17 1836 05/20/17 1919   05/20/17 2000  sulfamethoxazole-trimethoprim (BACTRIM,SEPTRA) 200-40 MG/5ML suspension 10 mL     10 mL Per Tube Daily 05/20/17 1901     05/20/17 2000  emtricitabine-tenofovir AF (DESCOVY) 200-25 MG per tablet 1 tablet  Status:  Discontinued     1 tablet Oral Daily 05/20/17 1909 05/20/17 1919   05/20/17 2000  rilpivirine (EDURANT)  tablet 25 mg  Status:  Discontinued     25 mg Per Tube Daily with breakfast 05/20/17 1909 05/20/17 1919   05/20/17 2000  flucytosine (ANCOBON) capsule 1,500 mg     1,500 mg Per Tube Every 6 hours 05/20/17 1926     05/20/17 1815  dolutegravir (TIVICAY) tablet 50 mg  Status:  Discontinued     50 mg Oral Daily 05/20/17 1813 05/20/17 1919   05/20/17 1815  sulfamethoxazole-trimethoprim (BACTRIM,SEPTRA) 400-80 MG per tablet 1 tablet  Status:  Discontinued     1 tablet Oral Daily 05/20/17 1813 05/20/17 1901   05/20/17 1800  flucytosine (ANCOBON) capsule 1,500 mg  Status:  Discontinued     25 mg/kg  63.5 kg Per Tube Every 6 hours 05/20/17 1645 05/20/17 1926   05/20/17 1700  amphotericin B liposome (AMBISOME) 250 mg in dextrose 5 % 500 mL IVPB     4 mg/kg  63.5 kg 250 mL/hr over 120 Minutes Intravenous Every 24 hours 05/20/17 1645     05/20/17 1300  ampicillin (OMNIPEN) 2 g in sodium chloride 0.9 % 50 mL IVPB  Status:  Discontinued     2 g 150 mL/hr over 20 Minutes Intravenous  Once 05/20/17 1237 05/20/17 1242   05/20/17 1300  acyclovir  (ZOVIRAX) 635 mg in dextrose 5 % 100 mL IVPB  Status:  Discontinued     10 mg/kg  63.5 kg 112.7 mL/hr over 60 Minutes Intravenous Every 8 hours 05/20/17 1243 05/20/17 1918   05/20/17 1245  cefTRIAXone (ROCEPHIN) 2 g in dextrose 5 % 50 mL IVPB     2 g 100 mL/hr over 30 Minutes Intravenous  Once 05/20/17 1237 05/20/17 1327   05/20/17 1245  vancomycin (VANCOCIN) IVPB 1000 mg/200 mL premix     1,000 mg 200 mL/hr over 60 Minutes Intravenous  Once 05/20/17 1237 05/20/17 1420      Subjective: Alert today, but not verbal. Shakes head no when asked if he's in pain.    Objective: Vitals:   06/07/17 2319 06/08/17 0340 06/08/17 0728 06/08/17 1235  BP: 115/75 121/80    Pulse: 91 (!) 105    Resp: 15 13    Temp: 98.1 F (36.7 C) 97.7 F (36.5 C) 97.6 F (36.4 C) 97.7 F (36.5 C)  TempSrc: Axillary Axillary Axillary Axillary  SpO2: 100% 100%    Weight:  67.6 kg (149 lb 0.5 oz)    Height:        Intake/Output Summary (Last 24 hours) at 06/08/17 1258 Last data filed at 06/08/17 1236  Gross per 24 hour  Intake             3632 ml  Output             7275 ml  Net            -3643 ml   Filed Weights   06/06/17 0517 06/07/17 0413 06/08/17 0340  Weight: 64.5 kg (142 lb 3.2 oz) 63.2 kg (139 lb 5.3 oz) 67.6 kg (149 lb 0.5 oz)    Examination:  General exam: Appears calm this morning  Respiratory system: Clear to auscultation. Respiratory effort normal. Cardiovascular system: S1 & S2 heard, RRR. No JVD, murmurs, rubs, gallops or clicks. No pedal edema. Gastrointestinal system: Abdomen is nondistended, soft and nontender. No organomegaly or masses felt. Normal bowel sounds heard. +NG in place, +Flexiseal in place  Central nervous system: Alert, does not follow command, pupils equal and reactive to light bilaterally  Extremities:  Symmetric in appearance Skin: No rashes, lesions or ulcers  Data Reviewed: I have personally reviewed following labs and imaging studies  CBC:  Recent  Labs Lab 06/04/17 0516 06/05/17 0258 06/06/17 0358 06/07/17 0420 06/08/17 0411  WBC 2.1* 1.9* 2.2* 2.3* 2.6*  NEUTROABS 1.2* 1.2* 1.3* 1.5* 1.5*  HGB 10.4* 9.6* 10.2* 10.3* 10.6*  HCT 30.7* 30.2* 31.1* 31.3* 32.1*  MCV 91.4 91.5 90.1 89.4 89.4  PLT 352 330 338 294 274   Basic Metabolic Panel:  Recent Labs Lab 06/03/17 0404 06/04/17 0516 06/05/17 0258 06/06/17 0358 06/07/17 0420 06/07/17 0722 06/08/17 0411  NA 135  135 135 133* 134*  --  134* 134*  K 4.1  4.1 3.9 4.3 4.2  --  4.2 4.0  CL 107  107 105 106 105  --  105 103  CO2 23  23 26 22 22   --  23 24  GLUCOSE 132*  131* 98 94 89  --  81 101*  BUN 11  11 15 14 10   --  12 11  CREATININE 0.62  0.62 0.65 0.65 0.68  --  0.61 0.63  CALCIUM 8.7*  8.6* 8.9 8.8* 8.7*  --  8.8* 8.9  MG 1.3* 1.6* 1.7 1.7 2.0  --  2.1  PHOS 5.1*  --  6.0* 6.1*  --  5.7* 5.9*   GFR: Estimated Creatinine Clearance: 133.8 mL/min (by C-G formula based on SCr of 0.63 mg/dL). Liver Function Tests:  Recent Labs Lab 06/03/17 0404 06/04/17 0516 06/05/17 0258 06/06/17 0358 06/07/17 0722 06/08/17 0411  AST 73* 81*  --   --   --   --   ALT 81* 90*  --   --   --   --   ALKPHOS 183* 190*  --   --   --   --   BILITOT 0.3 0.4  --   --   --   --   PROT 7.5 7.6  --   --   --   --   ALBUMIN 2.3*  2.4* 2.5* 2.6* 2.7* 2.7* 2.7*   No results for input(s): LIPASE, AMYLASE in the last 168 hours. No results for input(s): AMMONIA in the last 168 hours. Coagulation Profile: No results for input(s): INR, PROTIME in the last 168 hours. Cardiac Enzymes: No results for input(s): CKTOTAL, CKMB, CKMBINDEX, TROPONINI in the last 168 hours. BNP (last 3 results) No results for input(s): PROBNP in the last 8760 hours. HbA1C: No results for input(s): HGBA1C in the last 72 hours. CBG:  Recent Labs Lab 06/07/17 1947 06/07/17 2321 06/08/17 0342 06/08/17 0727 06/08/17 1153  GLUCAP 113* 88 90 91 85   Lipid Profile: No results for input(s): CHOL, HDL,  LDLCALC, TRIG, CHOLHDL, LDLDIRECT in the last 72 hours. Thyroid Function Tests: No results for input(s): TSH, T4TOTAL, FREET4, T3FREE, THYROIDAB in the last 72 hours. Anemia Panel: No results for input(s): VITAMINB12, FOLATE, FERRITIN, TIBC, IRON, RETICCTPCT in the last 72 hours. Sepsis Labs: No results for input(s): PROCALCITON, LATICACIDVEN in the last 168 hours.  Recent Results (from the past 240 hour(s))  CSF culture     Status: None (Preliminary result)   Collection Time: 06/05/17  9:39 AM  Result Value Ref Range Status   Specimen Description CSF  Final   Special Requests Immunocompromised  Final   Gram Stain   Final    WBC PRESENT, PREDOMINANTLY MONONUCLEAR BUDDING YEAST SEEN CRITICAL RESULT CALLED TO, READ BACK BY AND VERIFIED WITH: Buckman RN 10:20 06/05/17 (  [email or web address])    Culture NO GROWTH 3 DAYS  Final   Report Status PENDING  Incomplete  Fungus Culture With Stain     Status: None (Preliminary result)   Collection Time: 06/05/17  9:40 AM  Result Value Ref Range Status   Fungus Stain Final report  Final    Comment: (NOTE) Performed At: [facility] [address] [place], [name] [id number] Mila Homer MD NW:[id number]    Fungus (Mycology) Culture PENDING  Incomplete   Fungal Source CSF  Final  Fungus Culture Result     Status: None   Collection Time: 06/05/17  9:40 AM  Result Value Ref Range Status   Result 1 Yeast observed  Final    Comment: (NOTE) POSITIVE SMEAR REPORTED TO AMANDA L. AT 1101 ON 06/07/17 BY AM. Performed At: [facility] [address] [place], [name] [id number] Mila Homer MD BM:[id number]        Radiology Studies: Dg Chest Port 1 View  Result Date: 06/06/2017 CLINICAL DATA:  PICC placement EXAM: PORTABLE CHEST 1 VIEW COMPARISON:  05/27/2017 FINDINGS: Left arm PICC tip is at the SVC RA junction. Soft feeding tube enters the abdomen. The lungs are clear. No effusions. IMPRESSION: Left arm PICC tip at the SVC RA  junction.  Lungs clear. Electronically Signed   By: Paulina Fusi M.D.   On: 06/06/2017 13:21      Scheduled Meds: . acetaminophen  650 mg Oral Q24H  . azithromycin  1,200 mg Per Tube Weekly  . chlorhexidine  15 mL Mouth Rinse BID  . Chlorhexidine Gluconate Cloth  6 each Topical Daily  . diphenhydrAMINE  25 mg Oral Daily  . flucytosine  1,500 mg Per Tube Q6H  . insulin aspart  2-6 Units Subcutaneous Q4H  . lidocaine (PF)  10 mL Intradermal Once  . lidocaine (PF)  5 mL Other Once  . magnesium oxide  400 mg Per Tube BID  . mouth rinse  15 mL Mouth Rinse q12n4p  . metoprolol tartrate  5 mg Intravenous Q8H  . potassium chloride  40 mEq Oral Daily  . sulfamethoxazole-trimethoprim  10 mL Per Tube Daily   Continuous Infusions: . sodium chloride Stopped (06/05/17 0200)  . amphotericin  B  Liposome (AMBISOME) ADULT IV Stopped (06/07/17 2130)  . dextrose 5 % and 0.45% NaCl 50 mL/hr at 06/07/17 0600  . feeding supplement (VITAL AF 1.2 CAL) 1,000 mL (06/08/17 0737)  . levETIRAcetam Stopped (06/08/17 1040)  . linezolid (ZYVOX) IV Stopped (06/08/17 1125)  . magnesium sulfate 1 - 4 g bolus IVPB    . sodium chloride 1,000 mL (06/07/17 2200)  . sodium chloride       LOS: 19 days    Time spent: 30 minutes   Noralee Stain, DO [facility] [email or web address] Password [facility] 06/08/2017, 12:58 PM

## 2017-06-08 NOTE — Progress Notes (Signed)
Pt had LP again today. Most recent CSF culture from 06/05/17 remains negative to date for bacteria, fungus smear is positive. Assuming this is treated yeast and not active disease, can plan on VPS next Tuesday. Cont LP PRN.

## 2017-06-08 NOTE — Progress Notes (Signed)
OT Cancellation Note  Patient Details Name: Justin Ball MRN: [id number] DOB: 06/14/1991   Cancelled Treatment:    Reason Eval/Treat Not Completed: Patient at procedure or test/ unavailable. Ot to check back at time allows   Harolyn RutherfordJones, Jaydrien Wassenaar B  [phone] 06/08/2017, 12:58 PM

## 2017-06-08 NOTE — Progress Notes (Addendum)
PROGRESS NOTE    Justin Ball  WUJ:[id number] DOB: 07/29/1991 DOA: 05/20/2017 PCP: Randall HissVan Dam, [name], MD     Brief Narrative:  Justin SpurrLinston R Rademaker is a 26 y.o. male with a history of AIDS, noncompliance, syphilis, bipolar disorder who presented on 5/27 after being pulled from his car unresponsive with concern for new-onset seizures, postictal on arrival. He had been seen in ED 3 days prior with headaches, and discharged afebrile, well-appearing with negative neuroimaging. He was admitted to the ICU, intubated. LP performed demonstrated yeast and he was treated with amphotericin and flucytosine, in addition to vancomycin and zosyn, for cryptococcal meningitis. EEG showed PLEDs, keppra started. The day after admission the patient demonstrated a fixed right pupil and posturing. Mannitol was started, CT head negative, and ventriculostomy was placed by neurosurgery due to concern for herniation. Blood cultures grew positive with S. pneumoniae. CSF culture later grew S. epidermidis and linezolid was added on 6/6. He was extubated 6/2 and mental status is noted to still be altered from baseline. The patient was transferred to SDU and weaned from precedex on 6/8 to the Hospitalists service 6/9. Neurology, neurosurgery, and ID are consulting, and plan is for discharge to CIR once patient is stable and able to tolerate 3 hours of therapy daily.    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  -  Continue amphotericin and flucytosine per ID - Continue linezolid x14 days (6/6 - last dose on 6/19) - Continue keppra per neurology for seizure Tx - EVD removed 6/8  - LP repeated 6/12 with opening pressure 27 - LP today 6/15  - Plan for VPS placement, when repeat CSF culture negative, likely next week Monday or Tuesday  - Appreciate neurology, neurosurgery, ID expertise   Acute encephalopathy: Multifactorial with meningoencephalitis, seizures, ischemic infarcts (no antiplatelet per neuro), metabolic derangements and medications; likely complicated by delirium. - Numerous scattered areas of ischemia on MRI - Treat underlying conditions. - Ativan prn agitation, off precedex since 6/8 - Avoid haldol with seizure disorder  - SLP evaluating, giving dysphagia 2 diet in addition to TF's  - Continues to be altered, although improved in exam today   Streptococcus pneumoniae bacteremia - Linezolid per ID x14 days (6/6 - last dose on 6/19) - TTE ordered: No vegetations noted   Bordatella pneumonia - Was treated with doxycycline per ID, now off   AIDS: CD4 count April 2018 was 2.  - Hold ART per ID - Continue bactrim daily and weekly azithromycin (given 6/7) ppx  Neutropenia: Suspected due to flucytosine.  - Monitor.  Diarrhea: Acute, likely from tube feeds.  - Flexiseal - Anti-diarrheals ok  Chronic hepatitis B - Follow up with ID   DVT prophylaxis: SCDs Code Status: Full Family Communication: None at bedside Disposition Plan: Eventual DC to CIR once able to tolerate therapy for 3 hours daily. Plan for VPS next week    Consultants:   PCCM primary 5/27 - 6/8  Neurology  Neurosurgery  ID  Procedures:  OETT 5/27 - 6/3  OGT 5/27 - 6/3  NGT >>>   Right ventriculostomy 5/28 - 6/8  LUE TL PICC 6/3 >>>  Foley 6/4 >>>  Flexiseal 6/7  LP 6/12  Echocardiogram 06/02/2017: - Left ventricle: The cavity size was normal. Wall thickness was normal. Systolic  function was vigorous. The estimated ejection fraction was in the range of 65% to 70%. Left ventricular diastolic function parameters were normal. - Atrial septum: No defect or patent foramen ovale was identified.  Impressions: - No cardiac source of emboli was indentified.  Antimicrobials:  Anti-infectives    Start     Dose/Rate Route Frequency Ordered Stop   05/31/17 1000  azithromycin (ZITHROMAX) 200 MG/5ML suspension 1,200 mg     1,200 mg Per Tube Weekly 05/24/17 1315     05/30/17 1130  linezolid (ZYVOX) IVPB 600 mg     600 mg 300 mL/hr over 60 Minutes Intravenous Every 12 hours 05/30/17 1127     05/26/17 1215  doxycycline (VIBRAMYCIN) 100 mg in dextrose 5 % 250 mL IVPB  Status:  Discontinued     100 mg 125 mL/hr over 120 Minutes Intravenous Every 12 hours 05/26/17 1212 06/01/17 1448   05/26/17 0554  cefTRIAXone (ROCEPHIN) 2 g in dextrose 5 % 50 mL IVPB  Status:  Discontinued     2 g 100 mL/hr over 30 Minutes Intravenous Every 24 hours 05/25/17 1358 05/31/17 1323   05/24/17 1230  azithromycin (ZITHROMAX) 1,200 mg in dextrose 5 % 250 mL IVPB     1,200 mg 250 mL/hr over 60 Minutes Intravenous Weekly 05/24/17 1128 05/24/17 1407   05/21/17 0800  emtricitabine-rilpivir-tenofovir AF (ODEFSEY) 200-25-25 MG per tablet 1 tablet  Status:  Discontinued     1 tablet Oral Daily with breakfast 05/20/17 1813 05/20/17 1909   05/21/17 0600  vancomycin (VANCOCIN) IVPB 1000 mg/200 mL premix  Status:  Discontinued     1,000 mg 200 mL/hr over 60 Minutes Intravenous Every 8 hours 05/21/17 0549 05/21/17 1400   05/21/17 0545  cefTRIAXone (ROCEPHIN) 2 g in dextrose 5 % 50 mL IVPB  Status:  Discontinued     2 g 100 mL/hr over 30 Minutes Intravenous Every 12 hours 05/21/17 0531 05/25/17 1358   05/21/17 0100  cefTRIAXone (ROCEPHIN) 2 g in dextrose 5 % 50 mL IVPB  Status:  Discontinued     2 g 100 mL/hr over 30 Minutes Intravenous Every 12 hours 05/20/17 1245 05/20/17 1919   05/20/17 2100  vancomycin  (VANCOCIN) IVPB 1000 mg/200 mL premix  Status:  Discontinued     1,000 mg 200 mL/hr over 60 Minutes Intravenous Every 8 hours 05/20/17 1245 05/20/17 1918   05/20/17 2000  ampicillin (OMNIPEN) 1 g in sodium chloride 0.9 % 50 mL IVPB  Status:  Discontinued     1 g 150 mL/hr over 20 Minutes Intravenous Every 4 hours 05/20/17 1833 05/20/17 1836   05/20/17 2000  ampicillin (OMNIPEN) 2 g in sodium chloride 0.9 % 50 mL IVPB  Status:  Discontinued     2 g 150 mL/hr over 20 Minutes Intravenous Every 4 hours 05/20/17 1836 05/20/17 1919   05/20/17 2000  sulfamethoxazole-trimethoprim (BACTRIM,SEPTRA) 200-40 MG/5ML suspension 10 mL     10 mL Per Tube Daily 05/20/17 1901     05/20/17 2000  emtricitabine-tenofovir AF (DESCOVY) 200-25 MG per tablet 1 tablet  Status:  Discontinued     1 tablet Oral Daily 05/20/17 1909 05/20/17 1919   05/20/17 2000  rilpivirine (EDURANT)  tablet 25 mg  Status:  Discontinued     25 mg Per Tube Daily with breakfast 05/20/17 1909 05/20/17 1919   05/20/17 2000  flucytosine (ANCOBON) capsule 1,500 mg     1,500 mg Per Tube Every 6 hours 05/20/17 1926     05/20/17 1815  dolutegravir (TIVICAY) tablet 50 mg  Status:  Discontinued     50 mg Oral Daily 05/20/17 1813 05/20/17 1919   05/20/17 1815  sulfamethoxazole-trimethoprim (BACTRIM,SEPTRA) 400-80 MG per tablet 1 tablet  Status:  Discontinued     1 tablet Oral Daily 05/20/17 1813 05/20/17 1901   05/20/17 1800  flucytosine (ANCOBON) capsule 1,500 mg  Status:  Discontinued     25 mg/kg  63.5 kg Per Tube Every 6 hours 05/20/17 1645 05/20/17 1926   05/20/17 1700  amphotericin B liposome (AMBISOME) 250 mg in dextrose 5 % 500 mL IVPB     4 mg/kg  63.5 kg 250 mL/hr over 120 Minutes Intravenous Every 24 hours 05/20/17 1645     05/20/17 1300  ampicillin (OMNIPEN) 2 g in sodium chloride 0.9 % 50 mL IVPB  Status:  Discontinued     2 g 150 mL/hr over 20 Minutes Intravenous  Once 05/20/17 1237 05/20/17 1242   05/20/17 1300  acyclovir  (ZOVIRAX) 635 mg in dextrose 5 % 100 mL IVPB  Status:  Discontinued     10 mg/kg  63.5 kg 112.7 mL/hr over 60 Minutes Intravenous Every 8 hours 05/20/17 1243 05/20/17 1918   05/20/17 1245  cefTRIAXone (ROCEPHIN) 2 g in dextrose 5 % 50 mL IVPB     2 g 100 mL/hr over 30 Minutes Intravenous  Once 05/20/17 1237 05/20/17 1327   05/20/17 1245  vancomycin (VANCOCIN) IVPB 1000 mg/200 mL premix     1,000 mg 200 mL/hr over 60 Minutes Intravenous  Once 05/20/17 1237 05/20/17 1420      Subjective: Alert today, but not verbal. Shakes head no when asked if he's in pain.    Objective: Vitals:   06/07/17 1944 06/07/17 2319 06/08/17 0340 06/08/17 0728  BP: 126/85 115/75 121/80   Pulse: (!) 104 91 (!) 105   Resp: 15 15 13    Temp: 98.8 F (37.1 C) 98.1 F (36.7 C) 97.7 F (36.5 C) 97.6 F (36.4 C)  TempSrc: Axillary Axillary Axillary Axillary  SpO2: 100% 100% 100%   Weight:   67.6 kg (149 lb 0.5 oz)   Height:        Intake/Output Summary (Last 24 hours) at 06/08/17 1226 Last data filed at 06/08/17 1048  Gross per 24 hour  Intake             3632 ml  Output             6775 ml  Net            -3143 ml   Filed Weights   06/06/17 0517 06/07/17 0413 06/08/17 0340  Weight: 64.5 kg (142 lb 3.2 oz) 63.2 kg (139 lb 5.3 oz) 67.6 kg (149 lb 0.5 oz)    Examination:  General exam: Appears calm this morning  Respiratory system: Clear to auscultation. Respiratory effort normal. Cardiovascular system: S1 & S2 heard, RRR. No JVD, murmurs, rubs, gallops or clicks. No pedal edema. Gastrointestinal system: Abdomen is nondistended, soft and nontender. No organomegaly or masses felt. Normal bowel sounds heard. +NG in place, +Flexiseal in place  Central nervous system: Alert, does not follow command, pupils equal and reactive to light bilaterally  Extremities: Symmetric in appearance Skin: No rashes, lesions or ulcers  Data Reviewed: I have personally reviewed following labs and imaging  studies  CBC:  Recent Labs Lab 06/04/17 0516 06/05/17 0258 06/06/17 0358 06/07/17 0420 06/08/17 0411  WBC 2.1* 1.9* 2.2* 2.3* 2.6*  NEUTROABS 1.2* 1.2* 1.3* 1.5* 1.5*  HGB 10.4* 9.6* 10.2* 10.3* 10.6*  HCT 30.7* 30.2* 31.1* 31.3* 32.1*  MCV 91.4 91.5 90.1 89.4 89.4  PLT 352 330 338 294 274   Basic Metabolic Panel:  Recent Labs Lab 06/03/17 0404 06/04/17 0516 06/05/17 0258 06/06/17 0358 06/07/17 0420 06/07/17 0722 06/08/17 0411  NA 135  135 135 133* 134*  --  134* 134*  K 4.1  4.1 3.9 4.3 4.2  --  4.2 4.0  CL 107  107 105 106 105  --  105 103  CO2 23  23 26 22 22   --  23 24  GLUCOSE 132*  131* 98 94 89  --  81 101*  BUN 11  11 15 14 10   --  12 11  CREATININE 0.62  0.62 0.65 0.65 0.68  --  0.61 0.63  CALCIUM 8.7*  8.6* 8.9 8.8* 8.7*  --  8.8* 8.9  MG 1.3* 1.6* 1.7 1.7 2.0  --  2.1  PHOS 5.1*  --  6.0* 6.1*  --  5.7* 5.9*   GFR: Estimated Creatinine Clearance: 133.8 mL/min (by C-G formula based on SCr of 0.63 mg/dL). Liver Function Tests:  Recent Labs Lab 06/03/17 0404 06/04/17 0516 06/05/17 0258 06/06/17 0358 06/07/17 0722 06/08/17 0411  AST 73* 81*  --   --   --   --   ALT 81* 90*  --   --   --   --   ALKPHOS 183* 190*  --   --   --   --   BILITOT 0.3 0.4  --   --   --   --   PROT 7.5 7.6  --   --   --   --   ALBUMIN 2.3*  2.4* 2.5* 2.6* 2.7* 2.7* 2.7*   No results for input(s): LIPASE, AMYLASE in the last 168 hours. No results for input(s): AMMONIA in the last 168 hours. Coagulation Profile: No results for input(s): INR, PROTIME in the last 168 hours. Cardiac Enzymes: No results for input(s): CKTOTAL, CKMB, CKMBINDEX, TROPONINI in the last 168 hours. BNP (last 3 results) No results for input(s): PROBNP in the last 8760 hours. HbA1C: No results for input(s): HGBA1C in the last 72 hours. CBG:  Recent Labs Lab 06/07/17 1947 06/07/17 2321 06/08/17 0342 06/08/17 0727 06/08/17 1153  GLUCAP 113* 88 90 91 85   Lipid Profile: No results  for input(s): CHOL, HDL, LDLCALC, TRIG, CHOLHDL, LDLDIRECT in the last 72 hours. Thyroid Function Tests: No results for input(s): TSH, T4TOTAL, FREET4, T3FREE, THYROIDAB in the last 72 hours. Anemia Panel: No results for input(s): VITAMINB12, FOLATE, FERRITIN, TIBC, IRON, RETICCTPCT in the last 72 hours. Sepsis Labs: No results for input(s): PROCALCITON, LATICACIDVEN in the last 168 hours.  Recent Results (from the past 240 hour(s))  CSF culture     Status: None (Preliminary result)   Collection Time: 06/05/17  9:39 AM  Result Value Ref Range Status   Specimen Description CSF  Final   Special Requests Immunocompromised  Final   Gram Stain   Final    WBC PRESENT, PREDOMINANTLY MONONUCLEAR BUDDING YEAST SEEN CRITICAL RESULT CALLED TO, READ BACK BY AND VERIFIED WITH: Buckman RN 10:20  06/05/17 ([email or web address])    Culture NO GROWTH 3 DAYS  Final   Report Status PENDING  Incomplete  Fungus Culture With Stain     Status: None (Preliminary result)   Collection Time: 06/05/17  9:40 AM  Result Value Ref Range Status   Fungus Stain Final report  Final    Comment: (NOTE) Performed At: [facility] [address] [place], [name] [id number] Mila Homer MD WU:[id number]    Fungus (Mycology) Culture PENDING  Incomplete   Fungal Source CSF  Final  Fungus Culture Result     Status: None   Collection Time: 06/05/17  9:40 AM  Result Value Ref Range Status   Result 1 Yeast observed  Final    Comment: (NOTE) POSITIVE SMEAR REPORTED TO AMANDA L. AT 1101 ON 06/07/17 BY AM. Performed At: [facility] [address] [place], [name] [id number] Mila Homer MD VH:[id number]        Radiology Studies: Dg Chest Port 1 View  Result Date: 06/06/2017 CLINICAL DATA:  PICC placement EXAM: PORTABLE CHEST 1 VIEW COMPARISON:  05/27/2017 FINDINGS: Left arm PICC tip is at the SVC RA junction. Soft feeding tube enters the abdomen. The lungs are clear. No effusions. IMPRESSION: Left arm PICC  tip at the SVC RA junction.  Lungs clear. Electronically Signed   By: Paulina Fusi M.D.   On: 06/06/2017 13:21      Scheduled Meds: . acetaminophen  650 mg Oral Q24H  . azithromycin  1,200 mg Per Tube Weekly  . chlorhexidine  15 mL Mouth Rinse BID  . Chlorhexidine Gluconate Cloth  6 each Topical Daily  . diphenhydrAMINE  25 mg Oral Daily  . flucytosine  1,500 mg Per Tube Q6H  . insulin aspart  2-6 Units Subcutaneous Q4H  . lidocaine (PF)  10 mL Intradermal Once  . magnesium oxide  400 mg Per Tube BID  . mouth rinse  15 mL Mouth Rinse q12n4p  . metoprolol tartrate  5 mg Intravenous Q8H  . potassium chloride  40 mEq Oral Daily  . sulfamethoxazole-trimethoprim  10 mL Per Tube Daily   Continuous Infusions: . sodium chloride Stopped (06/05/17 0200)  . amphotericin  B  Liposome (AMBISOME) ADULT IV Stopped (06/07/17 2130)  . dextrose 5 % and 0.45% NaCl 50 mL/hr at 06/07/17 0600  . feeding supplement (VITAL AF 1.2 CAL) 1,000 mL (06/08/17 0737)  . levETIRAcetam Stopped (06/08/17 1040)  . linezolid (ZYVOX) IV Stopped (06/08/17 1125)  . magnesium sulfate 1 - 4 g bolus IVPB    . sodium chloride 1,000 mL (06/07/17 2200)  . sodium chloride       LOS: 19 days    Time spent: 30 minutes   Noralee Stain, DO [facility] [email or web address] Password [facility] 06/08/2017, 12:26 PM

## 2017-06-08 NOTE — Progress Notes (Signed)
SLP Cancellation Note  Patient Details Name: Justin SpurrLinston R Ball MRN: [id number] DOB: 03/10/1991   Cancelled treatment:       Reason Eval/Treat Not Completed: Fatigue/lethargy limiting ability to participate. Pt not alert enough for PO. Getting nutrition via tube.    Shronda Boeh, Riley NearingBonnie Caroline 06/08/2017, 11:08 AM

## 2017-06-08 NOTE — Procedures (Signed)
Indication: Elevated ICP and to see if  Yeast has been treated  This procedure was deemed emergent as his mental status was declining. Patients proxy was attempted to be called but was not able to be reached.    Decision to proceed was made by consensus agreement of two physicians.   The patient was prepped and draped, and using sterile technique a 20 gauge quinke spinal needle was inserted in the L 4/5 space. The opening pressure was 19 mmHg. Approximately 12 cc of CSF were obtained and sent for analysis. No complications were encountered.  3 attempts were made prior to obtaining CSF.  CSF was clear.   Felicie Mornavid Smith PA-C [facility] [phone]  M-F  (8:30 am- 4 PM)  06/08/2017, 1:32 PM

## 2017-06-08 NOTE — Progress Notes (Signed)
    [facility]    Date of Admission:  05/20/2017     Reason for visit:  Follow up on cryptococcal meningitis   Subjective: Alert but agitated this morning. He is in 4 point restraints. He is able to answer most questions appropriately. He is able to tell me his name, he [place] in [place], and his birthday. Not oriented to date.  Medications:  . acetaminophen  650 mg Oral Q24H  . azithromycin  1,200 mg Per Tube Weekly  . chlorhexidine  15 mL Mouth Rinse BID  . Chlorhexidine Gluconate Cloth  6 each Topical Daily  . diphenhydrAMINE  25 mg Oral Daily  . flucytosine  1,500 mg Per Tube Q6H  . insulin aspart  2-6 Units Subcutaneous Q4H  . lidocaine (PF)  10 mL Intradermal Once  . magnesium oxide  400 mg Per Tube BID  . mouth rinse  15 mL Mouth Rinse q12n4p  . metoprolol tartrate  5 mg Intravenous Q8H  . potassium chloride  40 mEq Oral Daily  . sulfamethoxazole-trimethoprim  10 mL Per Tube Daily    Objective: Vital signs in last 24 hours: Temp:  [97.6 F (36.4 C)-98.8 F (37.1 C)] 97.6 F (36.4 C) (06/15 0728) Pulse Rate:  [91-105] 105 (06/15 0340) Resp:  [13-15] 13 (06/15 0340) BP: (115-126)/(72-85) 121/80 (06/15 0340) SpO2:  [100 %] 100 % (06/15 0340) Weight:  [149 lb 0.5 oz (67.6 kg)] 149 lb 0.5 oz (67.6 kg) (06/15 0340)  General: Young man resting in bed, alert, agitated initially but calmed down after talking with him Neuro: alert and oriented to person, [place]. Follows commands.  Lab Results  Recent Labs  06/07/17 0420 06/07/17 0722 06/08/17 0411  WBC 2.3*  --  2.6*  HGB 10.3*  --  10.6*  HCT 31.3*  --  32.1*  NA  --  134* 134*  K  --  4.2 4.0  CL  --  105 103  CO2  --  23 24  BUN  --  12 11  CREATININE  --  0.61 0.63   Liver Panel  Recent Labs  06/07/17 0722 06/08/17 0411  ALBUMIN 2.7* 2.7*    Studies/Results: Dg Chest Port 1 View  Result Date: 06/06/2017 CLINICAL DATA:  PICC placement EXAM: PORTABLE CHEST 1 VIEW  COMPARISON:  05/27/2017 FINDINGS: Left arm PICC tip is at the SVC RA junction. Soft feeding tube enters the abdomen. The lungs are clear. No effusions. IMPRESSION: Left arm PICC tip at the SVC RA junction.  Lungs clear. Electronically Signed   By: Paulina FusiMark  Shogry M.D.   On: 06/06/2017 13:21     Assessment/Plan:  Increased intracranial pressure: He is more agitated than yesterday but overall his mental status is improving. Neurology is following and doing LPs for opening pressure as needed.   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   Staph epi meningitis:  Continue Linezolid, day 10 of 14.  Leukopenia: Stable. Likely related to Flucytosine and Linezolid.  HIV: Hold ART with cryptococcal meningitis and risk of IRIS.  Encephalopathy: Improved significantly. Hopefully will continue to improve as he works with PT/OT/SLP.   Attending note to follow  Rich Numberarly Rivet, MD, MPH Internal Medicine Resident, PGY-3 06/08/2017, 9:43 AM

## 2017-06-08 NOTE — Progress Notes (Addendum)
Subjective: Patient is alert however he is perseverating on wanting "shrimp". He is able to follow commands such as wiggling his toes raising his arms raising his legs. When asked where he is he cannot tell me however when asked if he's any pain he says no. When asked to close his eyes he is able to do so  Exam: Vitals:   06/08/17 0340 06/08/17 0728  BP: 121/80   Pulse: (!) 105   Resp: 13   Temp: 97.7 F (36.5 C) 97.6 F (36.4 C)    HEENT-  Drainage of clear CSF from sutured right frontal incision.  Ext: Warm and well perfused  Neuro:  Mentation:Patient is alert however he is perseverating on wanting "shrimp". He is able to follow commands such as wiggling his toes raising his arms raising his legs. When asked where he is he cannot tell me however when asked if he's any pain he says no. When asked to close his eyes he is able to do so CN: PERRL. Saccadic visual pursuits without nystagmus. Face with decreased motor tone, symmetrically. Hearing intact to voice. Motor: Moves all 4 extremities equally. Reflexes: 4+ patellae bilaterally.   Repeat Neurological examination by attending reveals awake, nonverbal patient with eyes open, not tracking visual stimuli and not responding to commands. Eyes slightly dysconjugate. Face flaccidly symmetric. Withdraws extremities to noxious stimuli.  Pertinent Labs/Diagnostics: None  Felicie MornDavid Smith PA-C [facility] [phone]  Assessment/Plan: 1. Cryptococcal meningoencephalitis. Per ID, he is tocontinue induction therapy with amphotericin and flucytosine. Given yeast on gram stain from recent CSF, ID had recommended that a repeat CSF sample be obtained this week. LP was done on Tuesday with samples sent to lab for repeat fungal stain and culture. Opening pressure on Tuesday 6/12 was 27 with 12 cc drained; following that LP, the patient's cognition improved significantly. Today patient remained improved initially, but neurological exam  worsened later in the afternoon. Additionally, there is continued CSF drainage at right frontal incision site from previous ventricular drain. Will need to repeat LP today (therapeutic tap) and also send CSF for fungal/bacterial cx and staining.   2. 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 MRI revealed. multifocal cortical diffusion restriction involving both cerebellar hemispheres and the bilateral temporal, occipital and frontal lobes, as well as the deep gray nuclei. Also noted were symmetric regions of restricted diffusion within the hippocampi - this latter finding is also suspicious for possible subclinical status epilepticus in addition to the observable seizures, occurring prior to the MRI scan. EEG on 5/28 showed diffuse slowing without epileptiform discharges.  3. Encephalophathy. Secondary to #1 and #2, above, as well as increased ICP. 4. Seizures. Have not recurred since initial presentation. Most likely secondary to #1, above. Continue Keppra at 1000 mg BID. Can consider a slow taper with close monitoring after additional neurological improvement has occurred.  5. Neurosurgery's plan is to proceed with VPS when CSF culture is confirmed negative likely Monday or Tuesday of next week.   Electronically signed: Dr. Caryl PinaEric Tiannah Greenly 06/08/2017, 9:35 AM   Addendum--LP was performed today with opening pressure of 19 mm HG

## 2017-06-08 NOTE — Progress Notes (Signed)
Physical Therapy Treatment Patient Details Name: Justin Ball MRN: [id number] DOB: [birth year] Today's Date: 06/08/2017    History of Present Illness Pt is a 26 year old man admitted s/p seizure activity having several days of recent headaches. 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 EVD was placed after acute decompensation on 5/28 with decorticate posturing and asymmetric pupils. 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 PMH includes: syphilis, substance abuse, soft palate ulceration, scabies, neuromuscular disorder, HIV/AIDS, depression, bipolar disorder, anxiety    PT Comments    Pt showing a little more insight into his deficits when stating, "Am I paralyzed?" during today's session.  Still with significant weakness, but participative and willing to continue to work hard with PT.  He would benefit from CIR level therapies at d/c. HR up to 127 during today's session.   Follow Up Recommendations  CIR     Equipment Recommendations  Wheelchair (measurements PT);Wheelchair cushion (measurements PT);Hospital bed    Recommendations for Other Services Rehab consult     Precautions / Restrictions Precautions Precautions: Fall Precaution Comments: flexiseal, foley, NG tube, IV     Mobility  Bed Mobility Overal bed mobility: Needs Assistance Bed Mobility: Supine to Sit     Supine to sit: +2 for physical assistance;Max assist;HOB elevated Sit to supine: +2 for physical assistance;Max assist   General bed mobility comments: Pt needs assist to intiate movement of legs to get to EOB and separately his trunk.  He is kicking in some assist once therapist initiates movement, but not much.   Transfers Overall transfer level: Needs assistance Equipment used: Rolling walker (2 wheeled) Transfers: Sit to/from Stand Sit to Stand: +2 physical assistance;Mod assist         General transfer comment: Pt stood X2 EOB  with two person assist and RW (although he lets go of the RW , so you have to physically/manually hold his hands on) with posterior lean on hyperextended knees.   Ambulation/Gait Ambulation/Gait assistance: +2 physical assistance;Mod assist   Assistive device: Rolling walker (2 wheeled) Gait Pattern/deviations: Step-to pattern     General Gait Details: Pt attempted to side step up HOB, but was not able to move his feet, and even when manually assisted he had a hard time remaining standing while weight shifting to move his feet.  I also do not believe he could coordinate the movement cognitively.           Balance Overall balance assessment: Needs assistance Sitting-balance support: Feet supported;Bilateral upper extremity supported Sitting balance-Leahy Scale: Poor Sitting balance - Comments: Pt fluctuated his sitting balance assist from close supervision up to heavy mod assist due to left lateral lean.  He did better once feet were securely and squarely on the floor, but he would not consistantly hold his midline sitting posture.  PT had to manually position his hands EOB to get him to prop.    Standing balance support: Bilateral upper extremity supported Standing balance-Leahy Scale: Poor                              Cognition Arousal/Alertness: Awake/alert Behavior During Therapy: Flat affect;Restless;Impulsive Overall Cognitive Status: Impaired/Different from baseline Area of Impairment: Orientation;Attention;Memory;Following commands;Safety/judgement;Awareness;Problem solving                 Orientation Level: Disoriented to;Person;Place;Time;Situation Current Attention Level: Focused Memory: Decreased recall of precautions;Decreased short-term memory Following Commands: Follows one step commands inconsistently  Safety/Judgement: Decreased awareness of safety;Decreased awareness of deficits Awareness: Intellectual Problem Solving: Slow processing;Decreased  initiation;Difficulty sequencing;Requires verbal cues;Requires tactile cues General Comments: "Am I paralized?" Pt repeating throught session.               Pertinent Vitals/Pain Pain Assessment: No/denies pain           PT Goals (current goals can now be found in the care plan section) Acute Rehab PT Goals Patient Stated Goal: unable to state Progress towards PT goals: Progressing toward goals    Frequency    Min 3X/week      PT Plan Current plan remains appropriate;Frequency needs to be updated (freq updated to match dx group)       AM-PAC PT "6 Clicks" Daily Activity  Outcome Measure  Difficulty turning over in bed (including adjusting bedclothes, sheets and blankets)?: Total Difficulty moving from lying on back to sitting on the side of the bed? : Total Difficulty sitting down on and standing up from a chair with arms (e.g., wheelchair, bedside commode, etc,.)?: Total Help needed moving to and from a bed to chair (including a wheelchair)?: A Lot Help needed walking in hospital room?: A Lot Help needed climbing 3-5 steps with a railing? : Total 6 Click Score: 8    End of Session Equipment Utilized During Treatment: Gait belt Activity Tolerance: Patient limited by fatigue Patient left: in bed;with call bell/phone within reach;with bed alarm set;with restraints reapplied Nurse Communication: Mobility status PT Visit Diagnosis: Other symptoms and signs involving the nervous system (R29.898);Muscle weakness (generalized) (M62.81);Difficulty in walking, not elsewhere classified (R26.2)     Time: [id number] PT Time Calculation (min) (ACUTE ONLY): 32 min  Charges:  $Therapeutic Activity: 23-37 mins            Jahiem Franzoni B. Ernesto Zukowski, PT, DPT [phone]            06/08/2017, 5:03 PM

## 2017-06-08 NOTE — Progress Notes (Signed)
Pharmacy Consult Note:  Electrolyte Replacement  26 yom currently on treatment for cryptococcal meningitis and strep pneumo bacteremia. Pharmacy consulted by ID to manage electrolyte replacement. SCr remains stable.  K 4.2 on KCl 40meq daily Mg up to 2 << 1.7 s/p a total of 8g IV replacement yesterday  Despite Magnesium being at goal - will still schedule a dose today to maintain levels given the patient has been requiring an average of 6g/day while on Ambisome.  Plan: Continue KCl 40 mEq daily Magnesium 5g IV x 1 dose today to help maintain therapeutic range Continue magnesium oxide 400 mg PT twice daily F/u AM lytes  Thank you for allowing pharmacy to be a part of this patient's care.  Georgina PillionElizabeth Toryn Dewalt, PharmD, BCPS Clinical Pharmacist Pager: [phone] Clinical phone for 06/08/2017 from 7a-3:30p: [phone] If after 3:30p, please call main pharmacy at: [phone] 06/08/2017 10:31 AM

## 2017-06-09 LAB — CBC WITH DIFFERENTIAL/PLATELET
BASOS ABS: 0.1 10*3/uL (ref 0.0–0.1)
BASOS PCT: 2 %
EOS ABS: 0.2 10*3/uL (ref 0.0–0.7)
EOS PCT: 6 %
HCT: 31.5 % — ABNORMAL LOW (ref 39.0–52.0)
Hemoglobin: 10.5 g/dL — ABNORMAL LOW (ref 13.0–17.0)
Lymphocytes Relative: 25 %
Lymphs Abs: 0.7 10*3/uL (ref 0.7–4.0)
MCH: 29.9 pg (ref 26.0–34.0)
MCHC: 33.3 g/dL (ref 30.0–36.0)
MCV: 89.7 fL (ref 78.0–100.0)
Monocytes Absolute: 0.3 10*3/uL (ref 0.1–1.0)
Monocytes Relative: 9 %
NEUTROS PCT: 58 %
Neutro Abs: 1.6 10*3/uL — ABNORMAL LOW (ref 1.7–7.7)
PLATELETS: 241 10*3/uL (ref 150–400)
RBC: 3.51 MIL/uL — AB (ref 4.22–5.81)
RDW: 14.1 % (ref 11.5–15.5)
WBC: 2.7 10*3/uL — AB (ref 4.0–10.5)

## 2017-06-09 LAB — RENAL FUNCTION PANEL
ALBUMIN: 2.6 g/dL — AB (ref 3.5–5.0)
Anion gap: 5 (ref 5–15)
BUN: 10 mg/dL (ref 6–20)
CALCIUM: 8.8 mg/dL — AB (ref 8.9–10.3)
CO2: 24 mmol/L (ref 22–32)
CREATININE: 0.65 mg/dL (ref 0.61–1.24)
Chloride: 105 mmol/L (ref 101–111)
Glucose, Bld: 89 mg/dL (ref 65–99)
PHOSPHORUS: 5.9 mg/dL — AB (ref 2.5–4.6)
Potassium: 4.3 mmol/L (ref 3.5–5.1)
SODIUM: 134 mmol/L — AB (ref 135–145)

## 2017-06-09 LAB — GLUCOSE, CAPILLARY
GLUCOSE-CAPILLARY: 101 mg/dL — AB (ref 65–99)
GLUCOSE-CAPILLARY: 78 mg/dL (ref 65–99)
GLUCOSE-CAPILLARY: 81 mg/dL (ref 65–99)
GLUCOSE-CAPILLARY: 82 mg/dL (ref 65–99)
GLUCOSE-CAPILLARY: 86 mg/dL (ref 65–99)
Glucose-Capillary: 92 mg/dL (ref 65–99)

## 2017-06-09 LAB — MAGNESIUM: MAGNESIUM: 2 mg/dL (ref 1.7–2.4)

## 2017-06-09 LAB — CRYPTOCOCCAL ANTIGEN, CSF
CRYPTO AG: POSITIVE — AB
Cryptococcal Ag Titer: 160 — AB

## 2017-06-09 NOTE — Progress Notes (Signed)
SLP Cancellation Note  Patient Details Name: Justin Ball MRN: [id number] DOB: 09/21/1991   Cancelled treatment:       Reason Eval/Treat Not Completed: Fatigue/lethargy limiting ability to participate (pt currently too lethargic for po, did not follow directions nor maintain alertness for po, has TF, will follow up)   Donavan Burnetamara Shaughnessy Gethers, MS [facility] SLP [phone]

## 2017-06-09 NOTE — Progress Notes (Signed)
PROGRESS NOTE    Justin Ball  ZOX:[id number] DOB: 1991-08-22 DOA: 05/20/2017 PCP: Randall Hiss, MD     Brief Narrative:  Justin Ball is a 26 y.o. male with a history of AIDS, noncompliance, syphilis, bipolar disorder who presented on 5/27 after being pulled from his car unresponsive with concern for new-onset seizures, postictal on arrival. He had been seen in ED 3 days prior with headaches, and discharged afebrile, well-appearing with negative neuroimaging. He was admitted to the ICU, intubated. LP performed demonstrated yeast and he was treated with amphotericin and flucytosine, in addition to vancomycin and zosyn, for cryptococcal meningitis. EEG showed PLEDs, keppra started. The day after admission the patient demonstrated a fixed right pupil and posturing. Mannitol was started, CT head negative, and ventriculostomy was placed by neurosurgery due to concern for herniation. Blood cultures grew positive with S. pneumoniae. CSF culture later grew S. epidermidis and linezolid was added on 6/6. He was extubated 6/2 and mental status is noted to still be altered from baseline. The patient was transferred to SDU and weaned from precedex on 6/8 to the Hospitalists service 6/9. Neurology, neurosurgery, and ID are consulting, and plan is for discharge to CIR once patient is stable and able to tolerate 3 hours of therapy daily.    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  -  Continue amphotericin and flucytosine per ID - Continue linezolid x14 days (6/6 - last dose on 6/19) - Continue keppra per neurology for seizure Tx - EVD removed 6/8  - LP repeated 6/12 with opening pressure - LP repeated 6/15 with opening pressure - Plan for VPS placement, when repeat CSF culture negative, likely next week Tuesday  - Appreciate neurology, neurosurgery, ID expertise   Acute encephalopathy: Multifactorial with meningoencephalitis, seizures, ischemic infarcts (no antiplatelet per neuro), metabolic derangements and medications; likely complicated by delirium. - Numerous scattered areas of ischemia on MRI - Treat underlying conditions. - Ativan prn agitation, off precedex since 6/8 - Avoid haldol with seizure disorder  - SLP evaluating, giving dysphagia 2 diet in addition to TF's  - Intermittently altered   Streptococcus pneumoniae bacteremia - Linezolid per ID x14 days (6/6 - last dose on 6/19) - TTE ordered: No vegetations noted   Bordatella pneumonia - Was treated with doxycycline per ID, now off   AIDS: CD4 count April 2018 was 2.  - Hold ART per ID - Continue bactrim daily and weekly azithromycin (given 6/7) ppx  Neutropenia: Suspected due to flucytosine.  - Monitor.  Diarrhea: Acute, likely from tube feeds.  - Flexiseal - Anti-diarrheals ok  Chronic hepatitis B - Follow up with ID   DVT prophylaxis: SCDs Code Status: Full Family Communication: None at bedside Disposition Plan: Eventual DC to CIR once able to tolerate therapy for 3 hours daily. Plan for VPS next week    Consultants:   PCCM primary 5/27 - 6/8  Neurology  Neurosurgery  ID  Procedures:   OETT 5/27 - 6/3  OGT 5/27 - 6/3  NGT >>>   Right ventriculostomy 5/28 - 6/8  LUE TL PICC 6/3 >>>  Foley 6/4 >>>  Flexiseal 6/7 >>>  LP 6/12  LP 6/15   Echocardiogram 06/02/2017: - Left ventricle: The cavity size was normal. Wall thickness was normal.  Systolic function was vigorous. The estimated ejection fraction was in the range of 65% to 70%. Left ventricular diastolic function parameters were normal. - Atrial septum: No defect or patent foramen ovale was identified.  Impressions: - No cardiac source of emboli was indentified.  Antimicrobials:  Anti-infectives    Start     Dose/Rate Route Frequency Ordered Stop   05/31/17 1000  azithromycin (ZITHROMAX) 200 MG/5ML suspension 1,200 mg     1,200 mg Per Tube Weekly 05/24/17 1315     05/30/17 1130  linezolid (ZYVOX) IVPB 600 mg     600 mg 300 mL/hr over 60 Minutes Intravenous Every 12 hours 05/30/17 1127     05/26/17 1215  doxycycline (VIBRAMYCIN) 100 mg in dextrose 5 % 250 mL IVPB  Status:  Discontinued     100 mg 125 mL/hr over 120 Minutes Intravenous Every 12 hours 05/26/17 1212 06/01/17 1448   05/26/17 0554  cefTRIAXone (ROCEPHIN) 2 g in dextrose 5 % 50 mL IVPB  Status:  Discontinued     2 g 100 mL/hr over 30 Minutes Intravenous Every 24 hours 05/25/17 1358 05/31/17 1323   05/24/17 1230  azithromycin (ZITHROMAX) 1,200 mg in dextrose 5 % 250 mL IVPB     1,200 mg 250 mL/hr over 60 Minutes Intravenous Weekly 05/24/17 1128 05/24/17 1407   05/21/17 0800  emtricitabine-rilpivir-tenofovir AF (ODEFSEY) 200-25-25 MG per tablet 1 tablet  Status:  Discontinued     1 tablet Oral Daily with breakfast 05/20/17 1813 05/20/17 1909   05/21/17 0600  vancomycin (VANCOCIN) IVPB 1000 mg/200 mL premix  Status:  Discontinued     1,000 mg 200 mL/hr over 60 Minutes Intravenous Every 8 hours 05/21/17 0549 05/21/17 1400   05/21/17 0545  cefTRIAXone (ROCEPHIN) 2 g in dextrose 5 % 50 mL IVPB  Status:  Discontinued     2 g 100 mL/hr over 30 Minutes Intravenous Every 12 hours 05/21/17 0531 05/25/17 1358   05/21/17 0100  cefTRIAXone (ROCEPHIN) 2 g in dextrose 5 % 50 mL IVPB  Status:  Discontinued     2 g 100 mL/hr over 30 Minutes Intravenous Every 12 hours 05/20/17 1245 05/20/17 1919   05/20/17 2100   vancomycin (VANCOCIN) IVPB 1000 mg/200 mL premix  Status:  Discontinued     1,000 mg 200 mL/hr over 60 Minutes Intravenous Every 8 hours 05/20/17 1245 05/20/17 1918   05/20/17 2000  ampicillin (OMNIPEN) 1 g in sodium chloride 0.9 % 50 mL IVPB  Status:  Discontinued     1 g 150 mL/hr over 20 Minutes Intravenous Every 4 hours 05/20/17 1833 05/20/17 1836   05/20/17 2000  ampicillin (OMNIPEN) 2 g in sodium chloride 0.9 % 50 mL IVPB  Status:  Discontinued     2 g 150 mL/hr over 20 Minutes Intravenous Every 4 hours 05/20/17 1836 05/20/17 1919   05/20/17 2000  sulfamethoxazole-trimethoprim (BACTRIM,SEPTRA) 200-40 MG/5ML suspension 10 mL     10 mL Per Tube Daily 05/20/17 1901     05/20/17 2000  emtricitabine-tenofovir AF (DESCOVY) 200-25 MG per tablet 1 tablet  Status:  Discontinued     1 tablet Oral Daily 05/20/17 1909 05/20/17 1919   05/20/17 2000  rilpivirine (EDURANT)  tablet 25 mg  Status:  Discontinued     25 mg Per Tube Daily with breakfast 05/20/17 1909 05/20/17 1919   05/20/17 2000  flucytosine (ANCOBON) capsule 1,500 mg     1,500 mg Per Tube Every 6 hours 05/20/17 1926     05/20/17 1815  dolutegravir (TIVICAY) tablet 50 mg  Status:  Discontinued     50 mg Oral Daily 05/20/17 1813 05/20/17 1919   05/20/17 1815  sulfamethoxazole-trimethoprim (BACTRIM,SEPTRA) 400-80 MG per tablet 1 tablet  Status:  Discontinued     1 tablet Oral Daily 05/20/17 1813 05/20/17 1901   05/20/17 1800  flucytosine (ANCOBON) capsule 1,500 mg  Status:  Discontinued     25 mg/kg  63.5 kg Per Tube Every 6 hours 05/20/17 1645 05/20/17 1926   05/20/17 1700  amphotericin B liposome (AMBISOME) 250 mg in dextrose 5 % 500 mL IVPB     4 mg/kg  63.5 kg 250 mL/hr over 120 Minutes Intravenous Every 24 hours 05/20/17 1645     05/20/17 1300  ampicillin (OMNIPEN) 2 g in sodium chloride 0.9 % 50 mL IVPB  Status:  Discontinued     2 g 150 mL/hr over 20 Minutes Intravenous  Once 05/20/17 1237 05/20/17 1242   05/20/17 1300   acyclovir (ZOVIRAX) 635 mg in dextrose 5 % 100 mL IVPB  Status:  Discontinued     10 mg/kg  63.5 kg 112.7 mL/hr over 60 Minutes Intravenous Every 8 hours 05/20/17 1243 05/20/17 1918   05/20/17 1245  cefTRIAXone (ROCEPHIN) 2 g in dextrose 5 % 50 mL IVPB     2 g 100 mL/hr over 30 Minutes Intravenous  Once 05/20/17 1237 05/20/17 1327   05/20/17 1245  vancomycin (VANCOCIN) IVPB 1000 mg/200 mL premix     1,000 mg 200 mL/hr over 60 Minutes Intravenous  Once 05/20/17 1237 05/20/17 1420      Subjective: Alert today, but very lethargic. Received ativan just prior to my exam. Per RN, patient had been chatty this morning, oriented to self, talked about his grandmother.    Objective: Vitals:   06/08/17 2349 06/09/17 0000 06/09/17 0408 06/09/17 0800  BP: 119/81 113/72 120/72   Pulse: 91 84 79   Resp: 12 17 11    Temp: 98.9 F (37.2 C)  98.7 F (37.1 C) 97.8 F (36.6 C)  TempSrc: Oral  Oral Axillary  SpO2: 100% 100% 99%   Weight:   63.1 kg (139 lb 1.8 oz)   Height:        Intake/Output Summary (Last 24 hours) at 06/09/17 1014 Last data filed at 06/09/17 0900  Gross per 24 hour  Intake             4285 ml  Output             5990 ml  Net            -1705 ml   Filed Weights   06/07/17 0413 06/08/17 0340 06/09/17 0408  Weight: 63.2 kg (139 lb 5.3 oz) 67.6 kg (149 lb 0.5 oz) 63.1 kg (139 lb 1.8 oz)    Examination:  General exam: Appears calm this morning   Respiratory system: Clear to auscultation. Respiratory effort normal. Cardiovascular system: S1 & S2 heard, RRR. No JVD, murmurs, rubs, gallops or clicks. No pedal edema. Gastrointestinal system: Abdomen is nondistended, soft and nontender. No organomegaly or masses felt. Normal bowel sounds heard. +NG in place, +Flexiseal in place  Central nervous system: Alert, but lethargic, does  not answer questions, pupils equal and reactive to light bilaterally  Extremities: Symmetric in appearance Skin: No rashes, lesions or ulcers  Data  Reviewed: I have personally reviewed following labs and imaging studies  CBC:  Recent Labs Lab 06/05/17 0258 06/06/17 0358 06/07/17 0420 06/08/17 0411 06/09/17 0323  WBC 1.9* 2.2* 2.3* 2.6* 2.7*  NEUTROABS 1.2* 1.3* 1.5* 1.5* 1.6*  HGB 9.6* 10.2* 10.3* 10.6* 10.5*  HCT 30.2* 31.1* 31.3* 32.1* 31.5*  MCV 91.5 90.1 89.4 89.4 89.7  PLT 330 338 294 274 241   Basic Metabolic Panel:  Recent Labs Lab 06/03/17 0404 06/04/17 0516 06/05/17 0258 06/06/17 0358 06/07/17 0420 06/07/17 0722 06/08/17 0411 06/09/17 0323  NA 135  135 135 133* 134*  --  134* 134*  --   K 4.1  4.1 3.9 4.3 4.2  --  4.2 4.0  --   CL 107  107 105 106 105  --  105 103  --   CO2 23  23 26 22 22   --  23 24  --   GLUCOSE 132*  131* 98 94 89  --  81 101*  --   BUN 11  11 15 14 10   --  12 11  --   CREATININE 0.62  0.62 0.65 0.65 0.68  --  0.61 0.63  --   CALCIUM 8.7*  8.6* 8.9 8.8* 8.7*  --  8.8* 8.9  --   MG 1.3* 1.6* 1.7 1.7 2.0  --  2.1 2.0  PHOS 5.1*  --  6.0* 6.1*  --  5.7* 5.9*  --    GFR: Estimated Creatinine Clearance: 124.9 mL/min (by C-G formula based on SCr of 0.63 mg/dL). Liver Function Tests:  Recent Labs Lab 06/03/17 0404 06/04/17 0516 06/05/17 0258 06/06/17 0358 06/07/17 0722 06/08/17 0411  AST 73* 81*  --   --   --   --   ALT 81* 90*  --   --   --   --   ALKPHOS 183* 190*  --   --   --   --   BILITOT 0.3 0.4  --   --   --   --   PROT 7.5 7.6  --   --   --   --   ALBUMIN 2.3*  2.4* 2.5* 2.6* 2.7* 2.7* 2.7*   No results for input(s): LIPASE, AMYLASE in the last 168 hours. No results for input(s): AMMONIA in the last 168 hours. Coagulation Profile: No results for input(s): INR, PROTIME in the last 168 hours. Cardiac Enzymes: No results for input(s): CKTOTAL, CKMB, CKMBINDEX, TROPONINI in the last 168 hours. BNP (last 3 results) No results for input(s): PROBNP in the last 8760 hours. HbA1C: No results for input(s): HGBA1C in the last 72 hours. CBG:  Recent Labs Lab  06/08/17 1553 06/08/17 2010 06/08/17 2341 06/09/17 0406 06/09/17 0845  GLUCAP 71 86 76 82 92   Lipid Profile: No results for input(s): CHOL, HDL, LDLCALC, TRIG, CHOLHDL, LDLDIRECT in the last 72 hours. Thyroid Function Tests: No results for input(s): TSH, T4TOTAL, FREET4, T3FREE, THYROIDAB in the last 72 hours. Anemia Panel: No results for input(s): VITAMINB12, FOLATE, FERRITIN, TIBC, IRON, RETICCTPCT in the last 72 hours. Sepsis Labs: No results for input(s): PROCALCITON, LATICACIDVEN in the last 168 hours.  Recent Results (from the past 240 hour(s))  CSF culture     Status: None (Preliminary result)   Collection Time: 06/05/17  9:39 AM  Result Value Ref Range Status   Specimen  Description CSF  Final   Special Requests Immunocompromised  Final   Gram Stain   Final    WBC PRESENT, PREDOMINANTLY MONONUCLEAR BUDDING YEAST SEEN CRITICAL RESULT CALLED TO, READ BACK BY AND VERIFIED WITH: Buckman RN 10:20 06/05/17 ([email or web address])    Culture NO GROWTH 3 DAYS  Final   Report Status PENDING  Incomplete  Fungus Culture With Stain     Status: None (Preliminary result)   Collection Time: 06/05/17  9:40 AM  Result Value Ref Range Status   Fungus Stain Final report  Final    Comment: (NOTE) Performed At: [facility] [address] [place], [place] [id number] Mila HomerHancock William F MD OZ:[id number]:[phone]    Fungus (Mycology) Culture PENDING  Incomplete   Fungal Source CSF  Final  Fungus Culture Result     Status: None   Collection Time: 06/05/17  9:40 AM  Result Value Ref Range Status   Result 1 Yeast observed  Final    Comment: (NOTE) POSITIVE SMEAR REPORTED TO AMANDA L. AT 1101 ON 06/07/17 BY AM. Performed At: [facility] [address] [place], [place] [id number] Mila HomerHancock William F MD KG:[id number]:[phone]   CSF culture with Stat gram stain     Status: None (Preliminary result)   Collection Time: 06/08/17  1:28 PM  Result Value Ref Range Status   Specimen Description CSF  Final    Special Requests Immunocompromised TUBE 2  Final   Gram Stain   Final    WBC PRESENT, PREDOMINANTLY MONONUCLEAR NO ORGANISMS SEEN CYTOSPIN SMEAR    Culture PENDING  Incomplete   Report Status PENDING  Incomplete       Radiology Studies: No results found.    Scheduled Meds: . acetaminophen  650 mg Oral Q24H  . azithromycin  1,200 mg Per Tube Weekly  . chlorhexidine  15 mL Mouth Rinse BID  . Chlorhexidine Gluconate Cloth  6 each Topical Daily  . diphenhydrAMINE  25 mg Oral Daily  . flucytosine  1,500 mg Per Tube Q6H  . insulin aspart  2-6 Units Subcutaneous Q4H  . lidocaine (PF)  10 mL Intradermal Once  . magnesium oxide  400 mg Per Tube BID  . mouth rinse  15 mL Mouth Rinse q12n4p  . metoprolol tartrate  5 mg Intravenous Q8H  . potassium chloride  40 mEq Oral Daily  . sulfamethoxazole-trimethoprim  10 mL Per Tube Daily   Continuous Infusions: . sodium chloride Stopped (06/05/17 0200)  . amphotericin  B  Liposome (AMBISOME) ADULT IV Stopped (06/08/17 2100)  . dextrose 5 % and 0.45% NaCl 50 mL/hr at 06/09/17 0900  . feeding supplement (VITAL AF 1.2 CAL) 1,000 mL (06/09/17 0900)  . levETIRAcetam Stopped (06/09/17 0951)  . linezolid (ZYVOX) IV 600 mg (06/09/17 0914)  . sodium chloride 1,000 mL (06/07/17 2200)  . sodium chloride       LOS: 20 days    Time spent: 30 minutes   Noralee StainJennifer Roslin Norwood, DO [facility] [email or web address] Password [email or web address] 06/09/2017, 10:14 AM

## 2017-06-09 NOTE — Progress Notes (Signed)
[facility]   Reason for visit: Follow up on cryptococcal meningitis  Interval History: no acute changes; VPS planned for Tuesday; repeat CSF gram stain with no yeast now, culture remains negative from 6/12 Linezolid Day 11/14 Amphotericin/flucytosine day 20    Physical Exam: Constitutional:  Vitals:   06/09/17 1108 06/09/17 1617  BP: 105/66 108/67  Pulse: 91 (!) 103  Resp: 15 18  Temp: 97.5 F (36.4 C) 97.3 F (36.3 C)   patient appears in NAD HENT: no thrush Respiratory: Normal respiratory effort; CTA B Cardiovascular: RRR GI: soft, nt, nd  Review of Systems: Unable to be assessed due to mental status   Lab Results  Component Value Date   WBC 2.7 (L) 06/09/2017   HGB 10.5 (L) 06/09/2017   HCT 31.5 (L) 06/09/2017   MCV 89.7 06/09/2017   PLT 241 06/09/2017    Lab Results  Component Value Date   CREATININE 0.65 06/09/2017   BUN 10 06/09/2017   NA 134 (L) 06/09/2017   K 4.3 06/09/2017   CL 105 06/09/2017   CO2 24 06/09/2017    Lab Results  Component Value Date   ALT 90 (H) 06/04/2017   AST 81 (H) 06/04/2017   ALKPHOS 190 (H) 06/04/2017     Microbiology: Recent Results (from the past 240 hour(s))  CSF culture     Status: None (Preliminary result)   Collection Time: 06/05/17  9:39 AM  Result Value Ref Range Status   Specimen Description CSF  Final   Special Requests Immunocompromised  Final   Gram Stain   Final    WBC PRESENT, PREDOMINANTLY MONONUCLEAR BUDDING YEAST SEEN CRITICAL RESULT CALLED TO, READ BACK BY AND VERIFIED WITH: Buckman RN 10:20 06/05/17 ([email or web address])    Culture NO GROWTH 4 DAYS  Final   Report Status PENDING  Incomplete  Fungus Culture With Stain     Status: None (Preliminary result)   Collection Time: 06/05/17  9:40 AM  Result Value Ref Range Status   Fungus Stain Final report  Final    Comment: (NOTE) Performed At: [facility] [address] [place], [place] [id number] Mila HomerHancock William F MD  [id number]:[phone]    Fungus (Mycology) Culture PENDING  Incomplete   Fungal Source CSF  Final  Fungus Culture Result     Status: None   Collection Time: 06/05/17  9:40 AM  Result Value Ref Range Status   Result 1 Yeast observed  Final    Comment: (NOTE) POSITIVE SMEAR REPORTED TO AMANDA L. AT 1101 ON 06/07/17 BY AM. [facility]formed At: [facility] [address] [place], [place] [id number] Mila HomerHancock William F MD BM:[id number]:[phone]   CSF culture with Stat gram stain     Status: None (Preliminary result)   Collection Time: 06/08/17  1:28 PM  Result Value Ref Range Status   Specimen Description CSF  Final   Special Requests Immunocompromised TUBE 2  Final   Gram Stain   Final    WBC PRESENT, PREDOMINANTLY MONONUCLEAR NO ORGANISMS SEEN CYTOSPIN SMEAR    Culture NO GROWTH 1 DAY  Final   Report Status PENDING  Incomplete  Culture, fungus without smear     Status: None (Preliminary result)   Collection Time: 06/08/17  1:28 PM  Result Value Ref Range Status   Specimen Description CSF  Final   Special Requests Immunocompromised TUBE 2  Final   Culture NO FUNGUS ISOLATED AFTER 1 DAY  Final   Report Status PENDING  Incomplete  Impression/Plan:  1. increased intracranial pressure - VPS Tuesday, last lp with op of 19.  Mental status good today per nursing (sleeping for me) - oriented to place  2.  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  Will continue through VPS placement but consider switching to fluconazole at 28 days if he does well and cultures remain negative.   3. Leukopenia - up to 2.7.    4.  HIV - still holding his ARVs due to #2 and concern for IRIS.    5.  Encephalopathy - overall improved.

## 2017-06-10 LAB — CBC WITH DIFFERENTIAL/PLATELET
BASOS PCT: 1 %
Basophils Absolute: 0 10*3/uL (ref 0.0–0.1)
Eosinophils Absolute: 0.2 10*3/uL (ref 0.0–0.7)
Eosinophils Relative: 6 %
HEMATOCRIT: 31.9 % — AB (ref 39.0–52.0)
Hemoglobin: 10.5 g/dL — ABNORMAL LOW (ref 13.0–17.0)
Lymphocytes Relative: 22 %
Lymphs Abs: 0.6 10*3/uL — ABNORMAL LOW (ref 0.7–4.0)
MCH: 29.4 pg (ref 26.0–34.0)
MCHC: 32.9 g/dL (ref 30.0–36.0)
MCV: 89.4 fL (ref 78.0–100.0)
MONO ABS: 0.2 10*3/uL (ref 0.1–1.0)
MONOS PCT: 9 %
Neutro Abs: 1.6 10*3/uL — ABNORMAL LOW (ref 1.7–7.7)
Neutrophils Relative %: 62 %
Platelets: 253 10*3/uL (ref 150–400)
RBC: 3.57 MIL/uL — ABNORMAL LOW (ref 4.22–5.81)
RDW: 13.7 % (ref 11.5–15.5)
WBC: 2.6 10*3/uL — ABNORMAL LOW (ref 4.0–10.5)

## 2017-06-10 LAB — GLUCOSE, CAPILLARY
GLUCOSE-CAPILLARY: 86 mg/dL (ref 65–99)
GLUCOSE-CAPILLARY: 104 mg/dL — AB (ref 65–99)
Glucose-Capillary: 79 mg/dL (ref 65–99)
Glucose-Capillary: 96 mg/dL (ref 65–99)
Glucose-Capillary: 98 mg/dL (ref 65–99)
Glucose-Capillary: 103 mg/dL — ABNORMAL HIGH (ref 65–99)

## 2017-06-10 LAB — RENAL FUNCTION PANEL
ANION GAP: 5 (ref 5–15)
Albumin: 2.6 g/dL — ABNORMAL LOW (ref 3.5–5.0)
BUN: 8 mg/dL (ref 6–20)
CHLORIDE: 106 mmol/L (ref 101–111)
CO2: 25 mmol/L (ref 22–32)
Calcium: 9.2 mg/dL (ref 8.9–10.3)
Creatinine, Ser: 0.64 mg/dL (ref 0.61–1.24)
GFR calc Af Amer: >60 mL/min (ref >60)
GFR calc non Af Amer: >60 mL/min (ref >60)
GLUCOSE: 91 mg/dL (ref 65–99)
POTASSIUM: 4 mmol/L (ref 3.5–5.1)
Phosphorus: 5.5 mg/dL — ABNORMAL HIGH (ref 2.5–4.6)
SODIUM: 136 mmol/L (ref 135–145)

## 2017-06-10 LAB — MAGNESIUM: Magnesium: 1.3 mg/dL — ABNORMAL LOW (ref 1.7–2.4)

## 2017-06-10 MED ORDER — MAGNESIUM SULFATE 2 GM/50ML IV SOLN
2.0000 g | Freq: Once | INTRAVENOUS | Status: AC
  Administered 2017-06-10: 2 g via INTRAVENOUS
  Filled 2017-06-10: qty 50

## 2017-06-10 NOTE — Progress Notes (Signed)
PROGRESS NOTE    Justin SpurrLinston R Lumadue  ZOX:[id number] DOB: 02/03/1991 DOA: 05/20/2017 PCP: Randall HissVan Dam, [name], MD     Brief Narrative:  Justin Ball is a 26 y.o. male with a history of AIDS, noncompliance, syphilis, bipolar disorder who presented on 5/27 after being pulled from his car unresponsive with concern for new-onset seizures, postictal on arrival. He had been seen in ED 3 days prior with headaches, and discharged afebrile, well-appearing with negative neuroimaging. He was admitted to the ICU, intubated. LP performed demonstrated yeast and he was treated with amphotericin and flucytosine, in addition to vancomycin and zosyn, for cryptococcal meningitis. EEG showed PLEDs, keppra started. The day after admission the patient demonstrated a fixed right pupil and posturing. Mannitol was started, CT head negative, and ventriculostomy was placed by neurosurgery due to concern for herniation. Blood cultures grew positive with S. pneumoniae. CSF culture later grew S. epidermidis and linezolid was added on 6/6. He was extubated 6/2 and mental status is noted to still be altered from baseline. The patient was transferred to SDU and [facility] Neurology, neurosurgery, and ID are consulting. Patient has had repeat lumbar puncture with elevated opening pressure. Plan is VP shunt placement next week.  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  - Continue  amphotericin and flucytosine per ID - Continue linezolid x14 days (6/6 - last dose on 6/19) - Continue keppra per neurology for seizure Tx - EVD removed 6/8  - LP repeated 6/12 with opening pressure 27mmHg - LP repeated 6/15 with opening pressure 19mmHg - Plan for VPS placement, when repeat CSF culture negative, likely next week Tuesday  - Appreciate neurology, neurosurgery, ID expertise   Acute encephalopathy: Multifactorial with meningoencephalitis, seizures, ischemic infarcts (no antiplatelet per neuro), metabolic derangements and medications; likely complicated by delirium. - Numerous scattered areas of ischemia on MRI - Treat underlying conditions. - Ativan prn agitation, off precedex since 6/8 - Avoid haldol with seizure disorder  - SLP evaluating, giving dysphagia 2 diet in addition to TF's  - Intermittently altered   Streptococcus pneumoniae bacteremia - Linezolid per ID x14 days (6/6 - last dose on 6/19) - TTE ordered: No vegetations noted   Bordatella pneumonia - Was treated with doxycycline per ID, now off   AIDS: CD4 count April 2018 was 2.  - Hold ART per ID - Continue bactrim daily and weekly azithromycin ppx  Neutropenia: Suspected due to flucytosine.  - Monitor.  Diarrhea: Acute, likely from tube feeds.  - Flexiseal - Anti-diarrheals ok  Chronic hepatitis B - Follow up with ID   DVT prophylaxis: SCDs Code Status: Full Family Communication: None at bedside Disposition Plan: Eventual DC to CIR once able to tolerate therapy for 3 hours daily. Plan for VPS next week    Consultants:   PCCM primary 5/27 - 6/8  Neurology  Neurosurgery  ID  Procedures:   OETT 5/27 - 6/3  OGT 5/27 - 6/3  NGT >>>  Right ventriculostomy 5/28 - 6/8  LUE TL PICC 6/3 >>>  Foley 6/4 >>>  Flexiseal 6/7 >>>  LP 6/12  LP 6/15   Echocardiogram 06/02/2017: - Left ventricle: The cavity size was normal. Wall thickness was normal. Systolic function was  vigorous. The estimated ejection fraction was in the range of 65% to 70%. Left ventricular diastolic function parameters were normal. - Atrial septum: No defect or patent foramen ovale was identified.  Impressions: - No cardiac source of emboli was indentified.  Antimicrobials:  Anti-infectives    Start     Dose/Rate Route Frequency Ordered Stop   05/31/17 1000  azithromycin (ZITHROMAX) 200 MG/5ML suspension 1,200 mg     1,200 mg Per Tube Weekly 05/24/17 1315     05/30/17 1130  linezolid (ZYVOX) IVPB 600 mg     600 mg 300 mL/hr over 60 Minutes Intravenous Every 12 hours 05/30/17 1127     05/26/17 1215  doxycycline (VIBRAMYCIN) 100 mg in dextrose 5 % 250 mL IVPB  Status:  Discontinued     100 mg 125 mL/hr over 120 Minutes Intravenous Every 12 hours 05/26/17 1212 06/01/17 1448   05/26/17 0554  cefTRIAXone (ROCEPHIN) 2 g in dextrose 5 % 50 mL IVPB  Status:  Discontinued     2 g 100 mL/hr over 30 Minutes Intravenous Every 24 hours 05/25/17 1358 05/31/17 1323   05/24/17 1230  azithromycin (ZITHROMAX) 1,200 mg in dextrose 5 % 250 mL IVPB     1,200 mg 250 mL/hr over 60 Minutes Intravenous Weekly 05/24/17 1128 05/24/17 1407   05/21/17 0800  emtricitabine-rilpivir-tenofovir AF (ODEFSEY) 200-25-25 MG per tablet 1 tablet  Status:  Discontinued     1 tablet Oral Daily with breakfast 05/20/17 1813 05/20/17 1909   05/21/17 0600  vancomycin (VANCOCIN) IVPB 1000 mg/200 mL premix  Status:  Discontinued     1,000 mg 200 mL/hr over 60 Minutes Intravenous Every 8 hours 05/21/17 0549 05/21/17 1400   05/21/17 0545  cefTRIAXone (ROCEPHIN) 2 g in dextrose 5 % 50 mL IVPB  Status:  Discontinued     2 g 100 mL/hr over 30 Minutes Intravenous Every 12 hours 05/21/17 0531 05/25/17 1358   05/21/17 0100  cefTRIAXone (ROCEPHIN) 2 g in dextrose 5 % 50 mL IVPB  Status:  Discontinued     2 g 100 mL/hr over 30 Minutes Intravenous Every 12 hours 05/20/17 1245 05/20/17 1919   05/20/17 2100  vancomycin (VANCOCIN)  IVPB 1000 mg/200 mL premix  Status:  Discontinued     1,000 mg 200 mL/hr over 60 Minutes Intravenous Every 8 hours 05/20/17 1245 05/20/17 1918   05/20/17 2000  ampicillin (OMNIPEN) 1 g in sodium chloride 0.9 % 50 mL IVPB  Status:  Discontinued     1 g 150 mL/hr over 20 Minutes Intravenous Every 4 hours 05/20/17 1833 05/20/17 1836   05/20/17 2000  ampicillin (OMNIPEN) 2 g in sodium chloride 0.9 % 50 mL IVPB  Status:  Discontinued     2 g 150 mL/hr over 20 Minutes Intravenous Every 4 hours 05/20/17 1836 05/20/17 1919   05/20/17 2000  sulfamethoxazole-trimethoprim (BACTRIM,SEPTRA) 200-40 MG/5ML suspension 10 mL     10 mL Per Tube Daily 05/20/17 1901     05/20/17 2000  emtricitabine-tenofovir AF (DESCOVY) 200-25 MG per tablet 1 tablet  Status:  Discontinued     1 tablet Oral Daily 05/20/17 1909 05/20/17 1919   05/20/17 2000  rilpivirine (EDURANT) tablet 25 mg  Status:  Discontinued  25 mg Per Tube Daily with breakfast 05/20/17 1909 05/20/17 1919   05/20/17 2000  flucytosine (ANCOBON) capsule 1,500 mg     1,500 mg Per Tube Every 6 hours 05/20/17 1926     05/20/17 1815  dolutegravir (TIVICAY) tablet 50 mg  Status:  Discontinued     50 mg Oral Daily 05/20/17 1813 05/20/17 1919   05/20/17 1815  sulfamethoxazole-trimethoprim (BACTRIM,SEPTRA) 400-80 MG per tablet 1 tablet  Status:  Discontinued     1 tablet Oral Daily 05/20/17 1813 05/20/17 1901   05/20/17 1800  flucytosine (ANCOBON) capsule 1,500 mg  Status:  Discontinued     25 mg/kg  63.5 kg Per Tube Every 6 hours 05/20/17 1645 05/20/17 1926   05/20/17 1700  amphotericin B liposome (AMBISOME) 250 mg in dextrose 5 % 500 mL IVPB     4 mg/kg  63.5 kg 250 mL/hr over 120 Minutes Intravenous Every 24 hours 05/20/17 1645     05/20/17 1300  ampicillin (OMNIPEN) 2 g in sodium chloride 0.9 % 50 mL IVPB  Status:  Discontinued     2 g 150 mL/hr over 20 Minutes Intravenous  Once 05/20/17 1237 05/20/17 1242   05/20/17 1300  acyclovir (ZOVIRAX) 635 mg in  dextrose 5 % 100 mL IVPB  Status:  Discontinued     10 mg/kg  63.5 kg 112.7 mL/hr over 60 Minutes Intravenous Every 8 hours 05/20/17 1243 05/20/17 1918   05/20/17 1245  cefTRIAXone (ROCEPHIN) 2 g in dextrose 5 % 50 mL IVPB     2 g 100 mL/hr over 30 Minutes Intravenous  Once 05/20/17 1237 05/20/17 1327   05/20/17 1245  vancomycin (VANCOCIN) IVPB 1000 mg/200 mL premix     1,000 mg 200 mL/hr over 60 Minutes Intravenous  Once 05/20/17 1237 05/20/17 1420      Subjective: Alert today. This is actually the best I've seen him in the hospital. He is able to answer questions for me, states that he knows he is in the hospital. He also tells me that his grandmother has been here to see him. He denies any complaints, no pain.  Objective: Vitals:   06/09/17 2343 06/10/17 0410 06/10/17 0836 06/10/17 1212  BP: 130/84 116/67 109/68 110/70  Pulse: 99 86    Resp: 13 14    Temp: 98.2 F (36.8 C) 98.4 F (36.9 C) 97.4 F (36.3 C) 97.9 F (36.6 C)  TempSrc: Oral Oral Axillary Axillary  SpO2: 100% 99%    Weight:  64.4 kg (141 lb 15.6 oz)    Height:        Intake/Output Summary (Last 24 hours) at 06/10/17 1249 Last data filed at 06/10/17 1214  Gross per 24 hour  Intake             4490 ml  Output             6125 ml  Net            -1635 ml   Filed Weights   06/08/17 0340 06/09/17 0408 06/10/17 0410  Weight: 67.6 kg (149 lb 0.5 oz) 63.1 kg (139 lb 1.8 oz) 64.4 kg (141 lb 15.6 oz)    Examination:  General exam: Appears calm this morning   Respiratory system: Clear to auscultation. Respiratory effort normal. Cardiovascular system: S1 & S2 heard, RRR. No JVD, murmurs, rubs, gallops or clicks. No pedal edema. Gastrointestinal system: Abdomen is nondistended, soft and nontender. No organomegaly or masses felt. Normal bowel sounds heard. +NG in  place, +Flexiseal in place  Central nervous system: Alert, oriented to self and place  Extremities: Symmetric in appearance Skin: No rashes, lesions or  ulcers  Data Reviewed: I have personally reviewed following labs and imaging studies  CBC:  Recent Labs Lab 06/06/17 0358 06/07/17 0420 06/08/17 0411 06/09/17 0323 06/10/17 0459  WBC 2.2* 2.3* 2.6* 2.7* 2.6*  NEUTROABS 1.3* 1.5* 1.5* 1.6* 1.6*  HGB 10.2* 10.3* 10.6* 10.5* 10.5*  HCT 31.1* 31.3* 32.1* 31.5* 31.9*  MCV 90.1 89.4 89.4 89.7 89.4  PLT 338 294 274 241 253   Basic Metabolic Panel:  Recent Labs Lab 06/06/17 0358 06/07/17 0420 06/07/17 0722 06/08/17 0411 06/09/17 0323 06/09/17 0500 06/10/17 0459  NA 134*  --  134* 134*  --  134* 136  K 4.2  --  4.2 4.0  --  4.3 4.0  CL 105  --  105 103  --  105 106  CO2 22  --  23 24  --  24 25  GLUCOSE 89  --  81 101*  --  89 91  BUN 10  --  12 11  --  10 8  CREATININE 0.68  --  0.61 0.63  --  0.65 0.64  CALCIUM 8.7*  --  8.8* 8.9  --  8.8* 9.2  MG 1.7 2.0  --  2.1 2.0  --  1.3*  PHOS 6.1*  --  5.7* 5.9*  --  5.9* 5.5*   GFR: Estimated Creatinine Clearance: 127.5 mL/min (by C-G formula based on SCr of 0.64 mg/dL). Liver Function Tests:  Recent Labs Lab 06/04/17 0516  06/06/17 0358 06/07/17 0722 06/08/17 0411 06/09/17 0500 06/10/17 0459  AST 81*  --   --   --   --   --   --   ALT 90*  --   --   --   --   --   --   ALKPHOS 190*  --   --   --   --   --   --   BILITOT 0.4  --   --   --   --   --   --   PROT 7.6  --   --   --   --   --   --   ALBUMIN 2.5*  < > 2.7* 2.7* 2.7* 2.6* 2.6*  < > = values in this interval not displayed. No results for input(s): LIPASE, AMYLASE in the last 168 hours. No results for input(s): AMMONIA in the last 168 hours. Coagulation Profile: No results for input(s): INR, PROTIME in the last 168 hours. Cardiac Enzymes: No results for input(s): CKTOTAL, CKMB, CKMBINDEX, TROPONINI in the last 168 hours. BNP (last 3 results) No results for input(s): PROBNP in the last 8760 hours. HbA1C: No results for input(s): HGBA1C in the last 72 hours. CBG:  Recent Labs Lab 06/09/17 2010  06/09/17 2344 06/10/17 0407 06/10/17 0828 06/10/17 1211  GLUCAP 86 101* 79 96 86   Lipid Profile: No results for input(s): CHOL, HDL, LDLCALC, TRIG, CHOLHDL, LDLDIRECT in the last 72 hours. Thyroid Function Tests: No results for input(s): TSH, T4TOTAL, FREET4, T3FREE, THYROIDAB in the last 72 hours. Anemia Panel: No results for input(s): VITAMINB12, FOLATE, FERRITIN, TIBC, IRON, RETICCTPCT in the last 72 hours. Sepsis Labs: No results for input(s): PROCALCITON, LATICACIDVEN in the last 168 hours.  Recent Results (from the past 240 hour(s))  CSF culture     Status: None (Preliminary result)   Collection Time: 06/05/17  9:39 AM  Result Value Ref Range Status   Specimen Description CSF  Final   Special Requests Immunocompromised  Final   Gram Stain   Final    WBC PRESENT, PREDOMINANTLY MONONUCLEAR BUDDING YEAST SEEN CRITICAL RESULT CALLED TO, READ BACK BY AND VERIFIED WITH: Buckman RN 10:20 06/05/17 ([email or web address])    Culture NO GROWTH 5 DAYS  Final   Report Status PENDING  Incomplete  Fungus Culture With Stain     Status: None (Preliminary result)   Collection Time: 06/05/17  9:40 AM  Result Value Ref Range Status   Fungus Stain Final report  Final    Comment: (NOTE) Performed At: [facility] [address] [place], [name] [id number] Mila Homer MD [id number]    Fungus (Mycology) Culture PENDING  Incomplete   Fungal Source CSF  Final  Fungus Culture Result     Status: None   Collection Time: 06/05/17  9:40 AM  Result Value Ref Range Status   Result 1 Yeast observed  Final    Comment: (NOTE) POSITIVE SMEAR REPORTED TO AMANDA L. AT 1101 ON 06/07/17 BY AM. Performed At: [facility] [address] [place], [name] [id number] Mila Homer MD WF:[id number]   CSF culture with Stat gram stain     Status: None (Preliminary result)   Collection Time: 06/08/17  1:28 PM  Result Value Ref Range Status   Specimen Description CSF  Final   Special Requests  Immunocompromised TUBE 2  Final   Gram Stain   Final    WBC PRESENT, PREDOMINANTLY MONONUCLEAR NO ORGANISMS SEEN CYTOSPIN SMEAR    Culture NO GROWTH 2 DAYS  Final   Report Status PENDING  Incomplete  Culture, fungus without smear     Status: None (Preliminary result)   Collection Time: 06/08/17  1:28 PM  Result Value Ref Range Status   Specimen Description CSF  Final   Special Requests Immunocompromised TUBE 2  Final   Culture NO FUNGUS ISOLATED AFTER 2 DAYS  Final   Report Status PENDING  Incomplete       Radiology Studies: No results found.    Scheduled Meds: . acetaminophen  650 mg Oral Q24H  . azithromycin  1,200 mg Per Tube Weekly  . chlorhexidine  15 mL Mouth Rinse BID  . Chlorhexidine Gluconate Cloth  6 each Topical Daily  . diphenhydrAMINE  25 mg Oral Daily  . flucytosine  1,500 mg Per Tube Q6H  . insulin aspart  2-6 Units Subcutaneous Q4H  . lidocaine (PF)  10 mL Intradermal Once  . mouth rinse  15 mL Mouth Rinse q12n4p  . metoprolol tartrate  5 mg Intravenous Q8H  . potassium chloride  40 mEq Oral Daily  . sulfamethoxazole-trimethoprim  10 mL Per Tube Daily   Continuous Infusions: . sodium chloride Stopped (06/05/17 0200)  . amphotericin  B  Liposome (AMBISOME) ADULT IV Stopped (06/09/17 2007)  . dextrose 5 % and 0.45% NaCl 50 mL/hr at 06/10/17 0520  . feeding supplement (VITAL AF 1.2 CAL) 1,000 mL (06/10/17 0618)  . levETIRAcetam Stopped (06/10/17 1138)  . linezolid (ZYVOX) IV 600 mg (06/10/17 1123)  . sodium chloride 1,000 mL (06/07/17 2200)  . sodium chloride       LOS: 21 days    Time spent: 30 minutes   Noralee Stain, DO [facility] [email or web address] Password [facility] 06/10/2017, 12:49 PM

## 2017-06-10 NOTE — Progress Notes (Signed)
[facility]   Reason for visit: Follow up on cryptococcal meningitis  Interval History: no acute changes; VPS planned for Tuesday; repeat CSF gram stain with no yeast, culture remains negative from 6/12 and 6/15 Linezolid Day 12 Amphotericin/flucytosine day 21    Physical Exam: Constitutional:  Vitals:   06/10/17 0836 06/10/17 1212  BP: 109/68 110/70  Pulse:    Resp:    Temp: 97.4 F (36.3 C) 97.9 F (36.6 C)   patient appears in NAD Respiratory: Normal respiratory effort; CTA B Cardiovascular: RRR GI: soft, nt, nd  Review of Systems: Unable to be assessed due to mental status   Lab Results  Component Value Date   WBC 2.6 (L) 06/10/2017   HGB 10.5 (L) 06/10/2017   HCT 31.9 (L) 06/10/2017   MCV 89.4 06/10/2017   PLT 253 06/10/2017    Lab Results  Component Value Date   CREATININE 0.64 06/10/2017   BUN 8 06/10/2017   NA 136 06/10/2017   K 4.0 06/10/2017   CL 106 06/10/2017   CO2 25 06/10/2017    Lab Results  Component Value Date   ALT 90 (H) 06/04/2017   AST 81 (H) 06/04/2017   ALKPHOS 190 (H) 06/04/2017     Microbiology: Recent Results (from the past 240 hour(s))  CSF culture     Status: None (Preliminary result)   Collection Time: 06/05/17  9:39 AM  Result Value Ref Range Status   Specimen Description CSF  Final   Special Requests Immunocompromised  Final   Gram Stain   Final    WBC PRESENT, PREDOMINANTLY MONONUCLEAR BUDDING YEAST SEEN CRITICAL RESULT CALLED TO, READ BACK BY AND VERIFIED WITH: Buckman RN 10:20 06/05/17 ([email or web address])    Culture NO GROWTH 5 DAYS  Final   Report Status PENDING  Incomplete  Fungus Culture With Stain     Status: None (Preliminary result)   Collection Time: 06/05/17  9:40 AM  Result Value Ref Range Status   Fungus Stain Final report  Final    Comment: (NOTE) Performed At: [facility] [address] [place], [place] [id number] Mila HomerHancock William F MD WU:[phone]    Fungus (Mycology)  Culture PENDING  Incomplete   Fungal Source CSF  Final  Fungus Culture Result     Status: None   Collection Time: 06/05/17  9:40 AM  Result Value Ref Range Status   Result 1 Yeast observed  Final    Comment: (NOTE) POSITIVE SMEAR REPORTED TO AMANDA L. AT 1101 ON 06/07/17 BY AM. Performed At: [facility] [address] [place], [place] [id number] Mila HomerHancock William F MD VH:[phone]   CSF culture with Stat gram stain     Status: None (Preliminary result)   Collection Time: 06/08/17  1:28 PM  Result Value Ref Range Status   Specimen Description CSF  Final   Special Requests Immunocompromised TUBE 2  Final   Gram Stain   Final    WBC PRESENT, PREDOMINANTLY MONONUCLEAR NO ORGANISMS SEEN CYTOSPIN SMEAR    Culture NO GROWTH 2 DAYS  Final   Report Status PENDING  Incomplete  Culture, fungus without smear     Status: None (Preliminary result)   Collection Time: 06/08/17  1:28 PM  Result Value Ref Range Status   Specimen Description CSF  Final   Special Requests Immunocompromised TUBE 2  Final   Culture NO FUNGUS ISOLATED AFTER 2 DAYS  Final   Report Status PENDING  Incomplete    Impression/Plan:  1. increased intracranial pressure - VPS Tuesday, last lp with op of 19 on 6/15.  Mental status improved, is oriented to place.    2.  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  Will continue through VPS placement but consider switching to fluconazole at 28 days (about 1 more week) if he does well and cultures remain negative. He will need to remain in the hospital for now due to constant monitoring on ampho and flucytosine.   3. Leukopenia - this remains stable and is 2.6 today.    4.  HIV - continue hold ARVs due to #2 and concern for IRIS.    5.  Encephalopathy - overall improved.   6.  CoNS - shunt culture.  Day 12 of treatment.  I will have him continue the linezolid 2-3 days past VPS placement.     Dr. Drue Second to follow up tomorrow

## 2017-06-11 ENCOUNTER — Other Ambulatory Visit: Payer: Self-pay | Admitting: Neurosurgery

## 2017-06-11 DIAGNOSIS — Z794 Long term (current) use of insulin: Secondary | ICD-10-CM

## 2017-06-11 DIAGNOSIS — E119 Type 2 diabetes mellitus without complications: Secondary | ICD-10-CM

## 2017-06-11 DIAGNOSIS — D72819 Decreased white blood cell count, unspecified: Secondary | ICD-10-CM

## 2017-06-11 LAB — GLUCOSE, CAPILLARY
GLUCOSE-CAPILLARY: 105 mg/dL — AB (ref 65–99)
GLUCOSE-CAPILLARY: 76 mg/dL (ref 65–99)
Glucose-Capillary: 96 mg/dL (ref 65–99)
Glucose-Capillary: 98 mg/dL (ref 65–99)
Glucose-Capillary: 88 mg/dL (ref 65–99)
Glucose-Capillary: 95 mg/dL (ref 65–99)

## 2017-06-11 LAB — RENAL FUNCTION PANEL
ALBUMIN: 2.7 g/dL — AB (ref 3.5–5.0)
ANION GAP: 6 (ref 5–15)
BUN: 11 mg/dL (ref 6–20)
CALCIUM: 8.8 mg/dL — AB (ref 8.9–10.3)
CO2: 24 mmol/L (ref 22–32)
CREATININE: 0.63 mg/dL (ref 0.61–1.24)
Chloride: 104 mmol/L (ref 101–111)
GFR calc Af Amer: >60 mL/min (ref >60)
GFR calc non Af Amer: >60 mL/min (ref >60)
Glucose, Bld: 96 mg/dL (ref 65–99)
PHOSPHORUS: 5.8 mg/dL — AB (ref 2.5–4.6)
Potassium: 3.8 mmol/L (ref 3.5–5.1)
SODIUM: 134 mmol/L — AB (ref 135–145)

## 2017-06-11 LAB — CBC WITH DIFFERENTIAL/PLATELET
BASOS PCT: 0 %
Basophils Absolute: 0 10*3/uL (ref 0.0–0.1)
EOS ABS: 0.1 10*3/uL (ref 0.0–0.7)
EOS PCT: 4 %
HCT: 30.4 % — ABNORMAL LOW (ref 39.0–52.0)
HEMOGLOBIN: 10.1 g/dL — AB (ref 13.0–17.0)
Lymphocytes Relative: 20 %
Lymphs Abs: 0.5 10*3/uL — ABNORMAL LOW (ref 0.7–4.0)
MCH: 29.9 pg (ref 26.0–34.0)
MCHC: 33.2 g/dL (ref 30.0–36.0)
MCV: 89.9 fL (ref 78.0–100.0)
Monocytes Absolute: 0.2 10*3/uL (ref 0.1–1.0)
Monocytes Relative: 7 %
Neutro Abs: 1.6 10*3/uL — ABNORMAL LOW (ref 1.7–7.7)
Neutrophils Relative %: 69 %
PLATELETS: 254 10*3/uL (ref 150–400)
RBC: 3.38 MIL/uL — ABNORMAL LOW (ref 4.22–5.81)
RDW: 14.1 % (ref 11.5–15.5)
WBC: 2.3 10*3/uL — AB (ref 4.0–10.5)

## 2017-06-11 LAB — MAGNESIUM: Magnesium: 1.6 mg/dL — ABNORMAL LOW (ref 1.7–2.4)

## 2017-06-11 MED ORDER — MAGNESIUM SULFATE 2 GM/50ML IV SOLN
2.0000 g | Freq: Four times a day (QID) | INTRAVENOUS | Status: AC
  Administered 2017-06-11 (×2): 2 g via INTRAVENOUS
  Filled 2017-06-11 (×2): qty 50

## 2017-06-11 MED ORDER — INSULIN ASPART 100 UNIT/ML ~~LOC~~ SOLN
2.0000 [IU] | SUBCUTANEOUS | Status: DC
  Administered 2017-06-12 – 2017-06-16 (×3): 2 [IU] via SUBCUTANEOUS

## 2017-06-11 NOTE — Progress Notes (Signed)
    [facility]    Date of Admission:  05/20/2017     Reason for visit: Follow up on cryptococcal meningitis    Subjective: Very tired this morning. Received Ativan at 2:30 AM. Would open eyes to voice but not following any other commands.   Medications:  . acetaminophen  650 mg Oral Q24H  . azithromycin  1,200 mg Per Tube Weekly  . chlorhexidine  15 mL Mouth Rinse BID  . Chlorhexidine Gluconate Cloth  6 each Topical Daily  . diphenhydrAMINE  25 mg Oral Daily  . flucytosine  1,500 mg Per Tube Q6H  . insulin aspart  2-6 Units Subcutaneous Q4H  . lidocaine (PF)  10 mL Intradermal Once  . mouth rinse  15 mL Mouth Rinse q12n4p  . metoprolol tartrate  5 mg Intravenous Q8H  . potassium chloride  40 mEq Oral Daily  . sulfamethoxazole-trimethoprim  10 mL Per Tube Daily    Objective: Vital signs in last 24 hours: Temp:  [97.4 F (36.3 C)-98.3 F (36.8 C)] 98.1 F (36.7 C) (06/18 0824) Pulse Rate:  [87-99] 87 (06/18 0824) Resp:  [14-20] 14 (06/18 0824) BP: (110-121)/(65-77) 121/70 (06/18 0824) SpO2:  [100 %] 100 % (06/18 0824) Weight:  [144 lb 10 oz (65.6 kg)] 144 lb 10 oz (65.6 kg) (06/18 0408)  General: Young man resting in bed, NAD CV: RRR, no m/g/r Neuro: lethargic, only opened eyes to voice Skin: no CSF leakage noted from suture site on right forehead  Lab Results  Recent Labs  06/10/17 0459 06/11/17 0446  WBC 2.6* 2.3*  HGB 10.5* 10.1*  HCT 31.9* 30.4*  NA 136 134*  K 4.0 3.8  CL 106 104  CO2 25 24  BUN 8 11  CREATININE 0.64 0.63   Liver Panel  Recent Labs  06/10/17 0459 06/11/17 0446  ALBUMIN 2.6* 2.7*    Microbiology: 6/5 CSF cx>> staph epidermidis 6/12 CSF cx>> budding yeast 6/15 CSF cx>>  Negative 6/15 fungal CSF cx>> negative     Assessment/Plan:  Increased intracranial pressure: Repeat LP on 6/15 with opening pressure of 19, down from 27. Neurosurgery plans to do VPS placement tomorrow. Repeat CSF on 6/15 without  yeast.  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 Will continue through VPS placement. Plan to switch to Fluconazole on day 28 (6/24) as long as cultures remain negative. Continue monitoring electrolytes.   CoNS shunt culture: Day 13 of Linezolid. Will continue 2 days past shunt placement (through 6/21).   Leukopenia: Stable. WBC count 2.3.  HIV: Hold ART due to cryptococcal meningitis and risk of IRIS.   Encephalopathy: Lethargic today but received Ativan all day yesterday. Would consider decreasing frequency to Q8H. Overall much improved.   Attending note to follow.   Rich Numberarly Shaena Parkerson, MD, MPH Internal Medicine Resident, PGY-3 Pager: [phone]  06/11/2017, 9:27 AM

## 2017-06-11 NOTE — Progress Notes (Signed)
Physical Therapy Treatment Patient Details Name: Justin Ball MRN: [id number] DOB: [birth year] Today's Date: 06/11/2017    History of Present Illness Pt is a 26 year old man admitted s/p seizure activity having several days of recent headaches. 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 EVD was placed after acute decompensation on 5/28 with decorticate posturing and asymmetric pupils. 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 PMH includes: syphilis, substance abuse, soft palate ulceration, scabies, neuromuscular disorder, HIV/AIDS, depression, bipolar disorder, anxiety    PT Comments    Pt is making limited progress towards his goals today. Pt was limited in his ability to participate in PT today due to increased lethargy from medication. Pt currently, maxAx2 for bed mobility, and modAx2 for sit<>stand, pt unable to follow commands to keep feet from crossing to initiate gait. Pt HR increased with standing and pt had to sit back down for HR to recover. Pt requires skilled PT to progress transfer and gait training and to strengthen LE to be able to safely mobilize in his discharge environment.    Follow Up Recommendations  CIR     Equipment Recommendations  Wheelchair (measurements PT);Wheelchair cushion (measurements PT);Hospital bed    Recommendations for Other Services Rehab consult     Precautions / Restrictions Precautions Precautions: Fall Precaution Comments: flexiseal, foley, NG tube, IV  Restrictions Weight Bearing Restrictions: No    Mobility  Bed Mobility Overal bed mobility: Needs Assistance Bed Mobility: Supine to Sit     Supine to sit: +2 for physical assistance;Max assist;HOB elevated Sit to supine: +2 for physical assistance;Max assist   General bed mobility comments: Pt needs assist for movement not able to help in purposeful movement to EoB  Transfers Overall transfer level: Needs  assistance Equipment used: Rolling walker (2 wheeled) Transfers: Sit to/from Stand Sit to Stand: +2 physical assistance;Mod assist         General transfer comment: Pt stood EOB 4 minutes with two person HHA maximal verbal and tactile cuing to correct posterior lean, requires hyperextension of knees to maintain upright  Ambulation/Gait Ambulation/Gait assistance: +2 physical assistance;Mod assist     Gait Pattern/deviations: Step-to pattern     General Gait Details: Pt scissoring of feet did not permit forward ambulation not able to correct or to maintain when corrected for him. Pt HR increased requiring him to sit down         Balance Overall balance assessment: Needs assistance Sitting-balance support: Feet supported;Bilateral upper extremity supported Sitting balance-Leahy Scale: Poor Sitting balance - Comments: Pt fluctuated his sitting balance assist from close supervision up to heavy mod assist due to left lateral lean.  He did better once feet were securely and squarely on the floor, but he would not consistantly hold his midline sitting posture.  PT had to manually position his hands EOB to get him to prop.    Standing balance support: Bilateral upper extremity supported Standing balance-Leahy Scale: Poor Standing balance comment: Required modAx2 HHA for maintaining upright from posterior lean vc for leaning forward                             Cognition Arousal/Alertness: Awake/alert Behavior During Therapy: Flat affect;Restless;Impulsive Overall Cognitive Status: Impaired/Different from baseline Area of Impairment: Orientation;Attention;Memory;Following commands;Safety/judgement;Awareness;Problem solving                 Orientation Level: Disoriented to;Person;Place;Time;Situation Current Attention Level: Focused Memory: Decreased recall of  precautions;Decreased short-term memory Following Commands: Follows one step commands  inconsistently Safety/Judgement: Decreased awareness of safety;Decreased awareness of deficits Awareness: Intellectual Problem Solving: Slow processing;Decreased initiation;Difficulty sequencing;Requires verbal cues;Requires tactile cues General Comments: Difficulty processing commands.         General Comments General comments (skin integrity, edema, etc.): Nursing reported pt was having a good morning but around 10:30 had an outburst and was given Ativan which limited his ability to particpate in PT today.      Pertinent Vitals/Pain Pain Assessment: Faces Faces Pain Scale: Hurts a little bit Pain Descriptors / Indicators: Grimacing  HR at rest 112 bpm with standing HR rose to 156 bpm, after returning to seated HR decreased to 118 bpm. SaO2 >95% on RA throughout session.            PT Goals (current goals can now be found in the care plan section) Acute Rehab PT Goals Patient Stated Goal: unable to state PT Goal Formulation: Patient unable to participate in goal setting Time For Goal Achievement: 06/15/17 Potential to Achieve Goals: Fair Progress towards PT goals: Progressing toward goals    Frequency    Min 3X/week      PT Plan Current plan remains appropriate;Frequency needs to be updated (freq updated to match dx group)       AM-PAC PT "6 Clicks" Daily Activity  Outcome Measure  Difficulty turning over in bed (including adjusting bedclothes, sheets and blankets)?: Total Difficulty moving from lying on back to sitting on the side of the bed? : Total Difficulty sitting down on and standing up from a chair with arms (e.g., wheelchair, bedside commode, etc,.)?: Total Help needed moving to and from a bed to chair (including a wheelchair)?: A Lot Help needed walking in hospital room?: A Lot Help needed climbing 3-5 steps with a railing? : Total 6 Click Score: 8    End of Session Equipment Utilized During Treatment: Gait belt Activity Tolerance: Patient limited by  fatigue Patient left: in bed;with call bell/phone within reach;with bed alarm set;with restraints reapplied Nurse Communication: Mobility status PT Visit Diagnosis: Other symptoms and signs involving the nervous system (R29.898);Muscle weakness (generalized) (M62.81);Difficulty in walking, not elsewhere classified (R26.2)     Time: [id number] PT Time Calculation (min) (ACUTE ONLY): 28 min  Charges:  $Therapeutic Activity: 23-37 mins                    G Codes:       Tayona Sarnowski B. Beverely RisenVan Fleet PT, DPT Acute Rehabilitation  [phone] Pager [phone]) [phone]     Elon Alaslizabeth B Van Fleet 06/11/2017, 1:15 PM

## 2017-06-11 NOTE — Progress Notes (Signed)
PROGRESS NOTE    Justin Ball  ZOX:[id number] DOB: 02-08-1991 DOA: 05/20/2017 PCP: Randall Hiss, MD  Outpatient Specialists:     Brief Narrative:  26 y/o ? HIV/AIDS diag 2013-high risk sexual behaviour  Documented virological failure on Tivicay/Truvada  anal intraepithelial neoplasia III s/p surgery Dr. Michaell Cowing 12/28/14  Rectal abscesses/submandibular abscesses Syphyllis Bipolar affecting care documented extensive medical noncompliance  Admit with unresponsiveness 5/28 initally found to have crytpo in CSF, Strep in blood  2/2 ICH and blown pupil, NS placed ventriculostomy for elevated ICP Extubated 05/26/17    Assessment & Plan:   Principal Problem:   Seizure (HCC) Active Problems:   HIV (human immunodeficiency virus infection) (HCC)   Rash and nonspecific skin eruption   Insomnia   Bipolar disorder (HCC)   Intersphincteric abscess   AIDS (HCC)   Headache   Meningoencephalitis   Respiratory failure (HCC)   Encephalopathy acute   Patient has nasogastric tube   History of syphilis   Tobacco abuse   Tachypnea   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  - Continue amphotericin and flucytosine per ID - Continue linezolid x14 days (6/6 - last dose on 6/19) - Continue keppra per neurology for seizure Tx - EVD removed 6/8  - LP repeated 6/12 with opening pressure - LP repeated 6/15 with opening pressure - Plan for VPS placement,  repeat CSF culture 6/15 negative-- planned for 6/19  Acute encephalopathy:  Multifactorial with meningoencephalitis, seizures, ischemic infarcts (no antiplatelet per neuro), metabolic derangements and medications; likely complicated by delirium. - Numerous scattered areas of ischemia on MRI - Treat underlying conditions. - Ativan prn agitation, off precedex since 6/8 - Avoid haldol with seizure disorder  - SLP  evaluating, giving dysphagia 2 diet in addition to TF's  - Intermittently altered  - after patient has V-P shunt if can transition to Bolus night feeds  Streptococcus pneumoniae bacteremia - Linezolid per ID x14 days (6/6 - last dose on 6/19) - TTE ordered: No vegetations noted   Bordatella pneumonia - Was treated with doxycycline per ID, now off   AIDS: CD4 count April 2018 was 2.  - Hold ART per ID - Continue bactrim daily and weekly azithromycin ppx  Neutropenia: Suspected due to flucytosine.  - Monitor.  Diarrhea: Acute, likely from tube feeds.  - Flexiseal - Anti-diarrheals ok  Chronic hepatitis B - Follow up with ID    scd Inpatient pending resolution D/w granny a bedside For CIr once clears  Consultants:    Multiple  Procedures:    multiple  Antimicrobials:   See notes    Subjective: Patient has been agitated over the past day. And this is responsive to Ativan   he has been eating intermittently a maybe about 25% and he is oriented enough to discuss He is in 4. restraints His grandmother is at the bedside and feels that he has improved  Objective: Vitals:   06/11/17 0356 06/11/17 0408 06/11/17 0824 06/11/17 1216  BP: 116/76  121/70 103/76  Pulse: 99  87 (!) 116  Resp: 20  14 17   Temp: 97.7 F (36.5 C)  98.1 F (36.7 C) 98.4 F (36.9 C)  TempSrc: Axillary  Axillary Oral  SpO2: 100%  100% 100%  Weight:  65.6 kg (144 lb 10 oz)    Height:        Intake/Output Summary (Last 24 hours) at 06/11/17 1432 Last data filed at 06/11/17 1230  Gross per 24 hour  Intake           4612.5 ml  Output             6000 ml  Net          -1387.5 ml   Filed Weights   06/09/17 0408 06/10/17 0410 06/11/17 0408  Weight: 63.1 kg (139 lb 1.8 oz) 64.4 kg (141 lb 15.6 oz) 65.6 kg (144 lb 10 oz)    Examination:  Intermittently agitated but then falls asleep as was just given Ativan S1-S2 no murmur rub or gallop Chest clinically clear no added  sound Abdomen soft nontender nondistended no rebound No lower extremity edema    Data Reviewed: I have personally reviewed following labs and imaging studies  CBC:  Recent Labs Lab 06/07/17 0420 06/08/17 0411 06/09/17 0323 06/10/17 0459 06/11/17 0446  WBC 2.3* 2.6* 2.7* 2.6* 2.3*  NEUTROABS 1.5* 1.5* 1.6* 1.6* 1.6*  HGB 10.3* 10.6* 10.5* 10.5* 10.1*  HCT 31.3* 32.1* 31.5* 31.9* 30.4*  MCV 89.4 89.4 89.7 89.4 89.9  PLT 294 274 241 253 254   Basic Metabolic Panel:  Recent Labs Lab 06/07/17 0420 06/07/17 0722 06/08/17 0411 06/09/17 0323 06/09/17 0500 06/10/17 0459 06/11/17 0446  NA  --  134* 134*  --  134* 136 134*  K  --  4.2 4.0  --  4.3 4.0 3.8  CL  --  105 103  --  105 106 104  CO2  --  23 24  --  24 25 24   GLUCOSE  --  81 101*  --  89 91 96  BUN  --  12 11  --  10 8 11   CREATININE  --  0.61 0.63  --  0.65 0.64 0.63  CALCIUM  --  8.8* 8.9  --  8.8* 9.2 8.8*  MG 2.0  --  2.1 2.0  --  1.3* 1.6*  PHOS  --  5.7* 5.9*  --  5.9* 5.5* 5.8*   GFR: Estimated Creatinine Clearance: 129.8 mL/min (by C-G formula based on SCr of 0.63 mg/dL). Liver Function Tests:  Recent Labs Lab 06/07/17 0722 06/08/17 0411 06/09/17 0500 06/10/17 0459 06/11/17 0446  ALBUMIN 2.7* 2.7* 2.6* 2.6* 2.7*   No results for input(s): LIPASE, AMYLASE in the last 168 hours. No results for input(s): AMMONIA in the last 168 hours. Coagulation Profile: No results for input(s): INR, PROTIME in the last 168 hours. Cardiac Enzymes: No results for input(s): CKTOTAL, CKMB, CKMBINDEX, TROPONINI in the last 168 hours. BNP (last 3 results) No results for input(s): PROBNP in the last 8760 hours. HbA1C: No results for input(s): HGBA1C in the last 72 hours. CBG:  Recent Labs Lab 06/10/17 1952 06/10/17 2253 06/11/17 0358 06/11/17 0823 06/11/17 1223  GLUCAP 103* 104* 96 98 88   Lipid Profile: No results for input(s): CHOL, HDL, LDLCALC, TRIG, CHOLHDL, LDLDIRECT in the last 72 hours. Thyroid  Function Tests: No results for input(s): TSH, T4TOTAL, FREET4, T3FREE, THYROIDAB in the last 72 hours. Anemia Panel: No results for input(s): VITAMINB12, FOLATE, FERRITIN, TIBC, IRON, RETICCTPCT in the last 72 hours. Urine analysis:    Component Value Date/Time   COLORURINE YELLOW 05/20/2017 1742   APPEARANCEUR CLEAR 05/20/2017 1742   LABSPEC 1.020 05/20/2017 1742   PHURINE 7.0 05/20/2017 1742   GLUCOSEU NEGATIVE 05/20/2017 1742   HGBUR NEGATIVE 05/20/2017 1742   BILIRUBINUR NEGATIVE 05/20/2017 1742   KETONESUR NEGATIVE 05/20/2017 1742   PROTEINUR NEGATIVE 05/20/2017 1742  UROBILINOGEN 4.0 (H) 09/09/2014 1055   NITRITE NEGATIVE 05/20/2017 1742   LEUKOCYTESUR NEGATIVE 05/20/2017 1742   Sepsis Labs: @LABRCNTIP (procalcitonin:4,lacticidven:4)  ) Recent Results (from the past 240 hour(s))  CSF culture     Status: None (Preliminary result)   Collection Time: 06/05/17  9:39 AM  Result Value Ref Range Status   Specimen Description CSF  Final   Special Requests Immunocompromised  Final   Gram Stain   Final    WBC PRESENT, PREDOMINANTLY MONONUCLEAR BUDDING YEAST SEEN CRITICAL RESULT CALLED TO, READ BACK BY AND VERIFIED WITH: Buckman RN 10:20 06/05/17 ([email or web address])    Culture   Final    RARE YEAST CULTURE REINCUBATED FOR BETTER GROWTH    Report Status PENDING  Incomplete  Fungus Culture With Stain     Status: None (Preliminary result)   Collection Time: 06/05/17  9:40 AM  Result Value Ref Range Status   Fungus Stain Final report  Final    Comment: (NOTE) Performed At: [facility] [address] [place], [name] [id number] Mila Homer MD WU:[id number]    Fungus (Mycology) Culture PENDING  Incomplete   Fungal Source CSF  Final  Fungus Culture Result     Status: None   Collection Time: 06/05/17  9:40 AM  Result Value Ref Range Status   Result 1 Yeast observed  Final    Comment: (NOTE) POSITIVE SMEAR REPORTED TO AMANDA L. AT 1101 ON 06/07/17 BY AM. Performed At: [facility] [address] [place], [name] [id number] Mila Homer MD VH:[id number]   CSF culture with Stat gram stain     Status: None (Preliminary result)   Collection Time: 06/08/17  1:28 PM  Result Value Ref Range Status   Specimen Description CSF  Final   Special Requests Immunocompromised TUBE 2  Final   Gram Stain   Final    WBC PRESENT, PREDOMINANTLY MONONUCLEAR NO ORGANISMS SEEN CYTOSPIN SMEAR    Culture NO GROWTH 3 DAYS  Final   Report Status PENDING  Incomplete  Culture, fungus without smear     Status: None (Preliminary result)   Collection Time: 06/08/17  1:28 PM  Result Value Ref Range Status   Specimen Description CSF  Final   Special Requests Immunocompromised TUBE 2  Final   Culture NO FUNGUS ISOLATED AFTER 3 DAYS  Final   Report Status PENDING  Incomplete         Radiology Studies: No results found.      Scheduled Meds: . acetaminophen  650 mg Oral Q24H  . azithromycin  1,200 mg Per Tube Weekly  . chlorhexidine  15 mL Mouth Rinse BID  . Chlorhexidine Gluconate Cloth  6 each Topical Daily  . diphenhydrAMINE  25 mg Oral Daily  . flucytosine  1,500 mg Per Tube Q6H  . insulin aspart  2-6 Units Subcutaneous Q4H  . lidocaine (PF)  10 mL Intradermal Once  . mouth rinse  15 mL Mouth Rinse q12n4p  . metoprolol tartrate  5 mg Intravenous Q8H  . potassium chloride  40 mEq Oral Daily  . sulfamethoxazole-trimethoprim  10 mL Per Tube Daily   Continuous Infusions: . sodium chloride Stopped (06/05/17 0200)  . amphotericin  B  Liposome (AMBISOME) ADULT IV Stopped (06/10/17 2052)  . dextrose 5 % and 0.45% NaCl 50 mL/hr at 06/11/17 0146  . feeding supplement (VITAL AF 1.2 CAL) 1,000 mL (06/11/17 1305)  . levETIRAcetam Stopped (06/11/17 0844)  . linezolid (ZYVOX) IV Stopped (06/11/17 1116)  . magnesium  sulfate 1 - 4 g bolus IVPB Stopped (06/11/17 1115)  . sodium chloride 1,000 mL (06/07/17 2200)  . sodium chloride       LOS: 22 days    Time  spent50    Pleas KochJai Abbeygail Igoe, MD [facility](P) [phone]   If 7PM-7AM, please contact night-coverage [email or web address] Password [email or web address] 06/11/2017, 2:32 PM

## 2017-06-11 NOTE — Progress Notes (Signed)
No issues currently.  EXAM:  BP 119/71 (BP Location: Right Arm)   Pulse 87   Temp 97.6 F (36.4 C) (Axillary)   Resp 16   Ht 6\' 1"  (1.854 m)   Wt 65.6 kg (144 lb 10 oz)   SpO2 100%   BMI 19.08 kg/m   Awake, alert Speech dysarthric but fluent CN grossly intact MAE well No drainage from right frontal EVD site.  IMPRESSION:  26 y.o. male with cryptococcal meningitis and resultant communicating HCP.   PLAN: - Lap assisted VPS tomorrow  I have reviewed the indications, risks, and benefits of the shunt placement with both the patient's mother and grandmother. Specifically, we discussed risks which include but are not limited to brain hemorrhage, shunt infection, shunt malfunction, injury to abdominal contents, and need for future surgeries. All their questions were answered and they provided verbal consent to proceed with shunt placement.

## 2017-06-11 NOTE — Progress Notes (Addendum)
Subjective: Patient is awake at this time and able to follow commands. He states he is in no discomfort when asked. He speaks very hypophonic Nedra HaiLee and at times hard to understand. He is able to count my fingers, squeeze my hands, move all extremities when asked he is still somewhat confused and was unable to tell me that he is at the hospital or the date or month. However he has much improved since when I saw him last week.  At this point current plan remains with neurosurgery to do VPS tomorrow   Exam: Vitals:   06/11/17 0356 06/11/17 0824  BP: 116/76 121/70  Pulse: 99 87  Resp: 20 14  Temp: 97.7 F (36.5 C) 98.1 F (36.7 C)    HEENT-  Normocephalic, no lesions, without obvious abnormality.  Normal external eye and conjunctiva.  Normal TM's bilaterally.  Normal auditory canals and external ears. Normal external nose, mucus membranes and septum.  Normal pharynx.    Neuro:  CN: Pupils are equal and round. They are symmetrically reactive from 3-->2 mm. EOMI without nystagmus. Facial sensation is intact to light touch. Face is symmetric at rest with normal strength and mobility. Hearing is intact to conversational voice. Palate elevates symmetrically and uvula is midline. Voice is hypophonic and somewhat difficult to understand but I would not call it dysarthric. Bilateral SCM and trapezii are 5/5. Tongue is midline with normal bulk and mobility.  Motor:  moving all extremities antigravity with good tone and 5/5 strength  Sensation: Intact to light touch.  DTRs: 3+, symmetric  Toes downgoing bilaterally.       Pertinent Labs/Diagnostics: Sodium 134 Calcium 8.8 Phosphorus 5.8 Albumin 2.7 Magnesium 1.6 WBC 2.3 Per infectious disease note on 06/11/1999 8 repeat CSF Gram stain showed no yeast and cultures remain negative from 6/12 and 6/15  Felicie Mornavid Smith PA-C [facility] [phone]  He is somnolent, but awakens with mild stimuli, able to tell me that he is in the  hospital and answer simple questions and follow commands.   Impression: This is a 26 year old male with cryptococcal meningitis. At this point management remains ICP control and antiobiotics. Nsgy planning VPS tomorrow and ID is following for antibiotics. His improvement is reassuring. Could potentially wean keppra    In 6 - 12 months, but would not plan this in the short term.    Recommendations: 1) No further recommendations at this time, please call with further questions or concerns.    Ritta SlotMcNeill Elanora Quin, MD [facility] [phone]  If 7pm- 7am, please page neurology on call as listed in AMION. 06/11/2017, 10:15 AM

## 2017-06-12 ENCOUNTER — Encounter (HOSPITAL_COMMUNITY)
Admission: EM | Disposition: A | Discharge status: Home Health | Payer: Self-pay | Source: Home / Self Care | Attending: Emergency Medicine

## 2017-06-12 ENCOUNTER — Inpatient Hospital Stay (HOSPITAL_COMMUNITY): Payer: No Typology Code available for payment source | Admitting: Anesthesiology

## 2017-06-12 DIAGNOSIS — Z982 Presence of cerebrospinal fluid drainage device: Secondary | ICD-10-CM

## 2017-06-12 HISTORY — PX: VENTRICULOPERITONEAL SHUNT: SHX204

## 2017-06-12 HISTORY — DX: Presence of cerebrospinal fluid drainage device: Z98.2

## 2017-06-12 HISTORY — PX: LAPAROSCOPIC REVISION VENTRICULAR-PERITONEAL (V-P) SHUNT: SHX5924

## 2017-06-12 LAB — CBC WITH DIFFERENTIAL/PLATELET
BASOS PCT: 2 %
Basophils Absolute: 0 10*3/uL (ref 0.0–0.1)
Eosinophils Absolute: 0.1 10*3/uL (ref 0.0–0.7)
Eosinophils Relative: 4 %
HEMATOCRIT: 30.4 % — AB (ref 39.0–52.0)
Hemoglobin: 10.1 g/dL — ABNORMAL LOW (ref 13.0–17.0)
Lymphocytes Relative: 25 %
Lymphs Abs: 0.7 10*3/uL (ref 0.7–4.0)
MCH: 29.4 pg (ref 26.0–34.0)
MCHC: 33.2 g/dL (ref 30.0–36.0)
MCV: 88.4 fL (ref 78.0–100.0)
MONO ABS: 0.2 10*3/uL (ref 0.1–1.0)
Monocytes Relative: 9 %
NEUTROS ABS: 1.6 10*3/uL — AB (ref 1.7–7.7)
Neutrophils Relative %: 60 %
Platelets: 246 10*3/uL (ref 150–400)
RBC: 3.44 MIL/uL — ABNORMAL LOW (ref 4.22–5.81)
RDW: 13.8 % (ref 11.5–15.5)
WBC: 2.6 10*3/uL — ABNORMAL LOW (ref 4.0–10.5)

## 2017-06-12 LAB — RENAL FUNCTION PANEL
ALBUMIN: 2.7 g/dL — AB (ref 3.5–5.0)
Anion gap: 7 (ref 5–15)
BUN: 12 mg/dL (ref 6–20)
CO2: 23 mmol/L (ref 22–32)
Calcium: 9 mg/dL (ref 8.9–10.3)
Chloride: 104 mmol/L (ref 101–111)
Creatinine, Ser: 0.64 mg/dL (ref 0.61–1.24)
GFR calc Af Amer: >60 mL/min (ref >60)
Glucose, Bld: 103 mg/dL — ABNORMAL HIGH (ref 65–99)
PHOSPHORUS: 6.1 mg/dL — AB (ref 2.5–4.6)
Potassium: 3.9 mmol/L (ref 3.5–5.1)
Sodium: 134 mmol/L — ABNORMAL LOW (ref 135–145)

## 2017-06-12 LAB — CSF CULTURE

## 2017-06-12 LAB — GLUCOSE, CAPILLARY
GLUCOSE-CAPILLARY: 77 mg/dL (ref 65–99)
Glucose-Capillary: 117 mg/dL — ABNORMAL HIGH (ref 65–99)
Glucose-Capillary: 149 mg/dL — ABNORMAL HIGH (ref 65–99)
Glucose-Capillary: 79 mg/dL (ref 65–99)
Glucose-Capillary: 80 mg/dL (ref 65–99)

## 2017-06-12 LAB — PATHOLOGIST SMEAR REVIEW

## 2017-06-12 LAB — CSF CULTURE W GRAM STAIN

## 2017-06-12 LAB — MAGNESIUM: Magnesium: 1.7 mg/dL (ref 1.7–2.4)

## 2017-06-12 SURGERY — SHUNT INSERTION VENTRICULAR-PERITONEAL
Anesthesia: General

## 2017-06-12 MED ORDER — FENTANYL CITRATE (PF) 100 MCG/2ML IJ SOLN
INTRAMUSCULAR | Status: DC | PRN
  Administered 2017-06-12 (×3): 50 ug via INTRAVENOUS
  Administered 2017-06-12: 100 ug via INTRAVENOUS

## 2017-06-12 MED ORDER — LIDOCAINE-EPINEPHRINE 1 %-1:100000 IJ SOLN
INTRAMUSCULAR | Status: AC
  Filled 2017-06-12: qty 1

## 2017-06-12 MED ORDER — BACITRACIN ZINC 500 UNIT/GM EX OINT
TOPICAL_OINTMENT | CUTANEOUS | Status: DC | PRN
  Administered 2017-06-12: 1 via TOPICAL

## 2017-06-12 MED ORDER — OXYCODONE HCL 5 MG PO TABS: 5.0000 mg | ORAL_TABLET | Freq: Once | ORAL | Status: DC | PRN

## 2017-06-12 MED ORDER — MIDAZOLAM HCL 5 MG/5ML IJ SOLN
INTRAMUSCULAR | Status: DC | PRN
  Administered 2017-06-12: 2 mg via INTRAVENOUS

## 2017-06-12 MED ORDER — BUPIVACAINE-EPINEPHRINE (PF) 0.25% -1:200000 IJ SOLN
INTRAMUSCULAR | Status: AC
  Filled 2017-06-12: qty 30

## 2017-06-12 MED ORDER — SODIUM CHLORIDE 0.9 % IR SOLN
Status: DC | PRN
  Administered 2017-06-12: 2000 mL

## 2017-06-12 MED ORDER — PROPOFOL 10 MG/ML IV BOLUS
INTRAVENOUS | Status: DC | PRN
  Administered 2017-06-12: 200 mg via INTRAVENOUS

## 2017-06-12 MED ORDER — SUGAMMADEX SODIUM 200 MG/2ML IV SOLN
INTRAVENOUS | Status: DC | PRN
  Administered 2017-06-12: 200 mg via INTRAVENOUS

## 2017-06-12 MED ORDER — BUPIVACAINE-EPINEPHRINE 0.25% -1:200000 IJ SOLN
INTRAMUSCULAR | Status: DC | PRN
  Administered 2017-06-12: 10 mL

## 2017-06-12 MED ORDER — THROMBIN 5000 UNITS EX SOLR
CUTANEOUS | Status: DC | PRN
  Administered 2017-06-12: 10000 [IU] via TOPICAL

## 2017-06-12 MED ORDER — FENTANYL CITRATE (PF) 250 MCG/5ML IJ SOLN
INTRAMUSCULAR | Status: AC
  Filled 2017-06-12: qty 5

## 2017-06-12 MED ORDER — BUPIVACAINE HCL (PF) 0.5 % IJ SOLN
INTRAMUSCULAR | Status: AC
  Filled 2017-06-12: qty 30

## 2017-06-12 MED ORDER — PROPOFOL 10 MG/ML IV BOLUS
INTRAVENOUS | Status: AC
  Filled 2017-06-12: qty 20

## 2017-06-12 MED ORDER — FENTANYL CITRATE (PF) 100 MCG/2ML IJ SOLN: 25.0000 ug | INTRAMUSCULAR | Status: DC | PRN

## 2017-06-12 MED ORDER — SUCCINYLCHOLINE CHLORIDE 20 MG/ML IJ SOLN
INTRAMUSCULAR | Status: DC | PRN
  Administered 2017-06-12: 80 mg via INTRAVENOUS

## 2017-06-12 MED ORDER — PHENYLEPHRINE HCL 10 MG/ML IJ SOLN
INTRAMUSCULAR | Status: DC | PRN
  Administered 2017-06-12: 25 ug/min via INTRAVENOUS

## 2017-06-12 MED ORDER — HYDROCODONE-ACETAMINOPHEN 5-325 MG PO TABS
1.0000 | ORAL_TABLET | Freq: Four times a day (QID) | ORAL | Status: DC | PRN
  Administered 2017-06-12 – 2017-06-13 (×3): 2 via ORAL
  Filled 2017-06-12 (×3): qty 2

## 2017-06-12 MED ORDER — BACITRACIN ZINC 500 UNIT/GM EX OINT
TOPICAL_OINTMENT | CUTANEOUS | Status: AC
  Filled 2017-06-12: qty 28.35

## 2017-06-12 MED ORDER — MIDAZOLAM HCL 2 MG/2ML IJ SOLN
INTRAMUSCULAR | Status: AC
  Filled 2017-06-12: qty 2

## 2017-06-12 MED ORDER — ONDANSETRON HCL 4 MG/2ML IJ SOLN
INTRAMUSCULAR | Status: DC | PRN
  Administered 2017-06-12: 4 mg via INTRAVENOUS

## 2017-06-12 MED ORDER — MAGNESIUM SULFATE 2 GM/50ML IV SOLN
2.0000 g | Freq: Four times a day (QID) | INTRAVENOUS | Status: AC
  Administered 2017-06-12: 2 g via INTRAVENOUS
  Filled 2017-06-12: qty 50

## 2017-06-12 MED ORDER — HEMOSTATIC AGENTS (NO CHARGE) OPTIME
TOPICAL | Status: DC | PRN
  Administered 2017-06-12: 1 via TOPICAL

## 2017-06-12 MED ORDER — SODIUM CHLORIDE 0.9 % IR SOLN
Status: DC | PRN
  Administered 2017-06-12: 14:00:00

## 2017-06-12 MED ORDER — OXYCODONE HCL 5 MG/5ML PO SOLN: 5.0000 mg | Freq: Once | ORAL | Status: DC | PRN

## 2017-06-12 MED ORDER — THROMBIN 5000 UNITS EX SOLR
CUTANEOUS | Status: AC
  Filled 2017-06-12: qty 10000

## 2017-06-12 MED ORDER — PHENYLEPHRINE HCL 10 MG/ML IJ SOLN
INTRAMUSCULAR | Status: DC | PRN
  Administered 2017-06-12 (×2): 80 ug via INTRAVENOUS

## 2017-06-12 MED ORDER — LACTATED RINGERS IV SOLN
INTRAVENOUS | Status: DC | PRN
  Administered 2017-06-12 (×2): via INTRAVENOUS

## 2017-06-12 MED ORDER — ROCURONIUM BROMIDE 100 MG/10ML IV SOLN
INTRAVENOUS | Status: DC | PRN
  Administered 2017-06-12: 50 mg via INTRAVENOUS
  Administered 2017-06-12 (×2): 10 mg via INTRAVENOUS

## 2017-06-12 SURGICAL SUPPLY — 91 items
BENZOIN TINCTURE PRP APPL 2/3 (GAUZE/BANDAGES/DRESSINGS) ×3 IMPLANT
BLADE CLIPPER SURG (BLADE) ×3 IMPLANT
BLADE SURG 11 STRL SS (BLADE) ×6 IMPLANT
BOOT SUTURE AID YELLOW STND (SUTURE) ×3 IMPLANT
BUR PRECISION FLUTE 5.0 (BURR) ×3 IMPLANT
CANISTER SUCT 3000ML PPV (MISCELLANEOUS) ×3 IMPLANT
CARTRIDGE OIL MAESTRO DRILL (MISCELLANEOUS) ×1 IMPLANT
CATH SNAP PUDENZ 6CM (Shunt) ×3 IMPLANT
CHLORAPREP W/TINT 26ML (MISCELLANEOUS) ×3 IMPLANT
CLIP RANEY DISP (INSTRUMENTS) IMPLANT
CLOSURE WOUND 1/2 X4 (GAUZE/BANDAGES/DRESSINGS) ×1
CONT SPEC 4OZ CLIKSEAL STRL BL (MISCELLANEOUS) ×3 IMPLANT
DECANTER SPIKE VIAL GLASS SM (MISCELLANEOUS) ×3 IMPLANT
DERMABOND ADVANCED (GAUZE/BANDAGES/DRESSINGS) ×2
DERMABOND ADVANCED .7 DNX12 (GAUZE/BANDAGES/DRESSINGS) ×1 IMPLANT
DIFFUSER DRILL AIR PNEUMATIC (MISCELLANEOUS) ×3 IMPLANT
DRAPE HALF SHEET 40X57 (DRAPES) ×3 IMPLANT
DRAPE INCISE IOBAN 66X45 STRL (DRAPES) ×3 IMPLANT
DRAPE LAPAROSCOPIC ABDOMINAL (DRAPES) IMPLANT
DRAPE ORTHO SPLIT 77X108 STRL (DRAPES) ×4
DRAPE POUCH INSTRU U-SHP 10X18 (DRAPES) ×3 IMPLANT
DRAPE SURG ORHT 6 SPLT 77X108 (DRAPES) ×2 IMPLANT
DRAPE UTILITY XL STRL (DRAPES) IMPLANT
DRSG OPSITE 4X5.5 SM (GAUZE/BANDAGES/DRESSINGS) ×3 IMPLANT
DRSG OPSITE POSTOP 3X4 (GAUZE/BANDAGES/DRESSINGS) ×9 IMPLANT
DRSG TEGADERM 2-3/8X2-3/4 SM (GAUZE/BANDAGES/DRESSINGS) ×9 IMPLANT
DURAPREP 26ML APPLICATOR (WOUND CARE) ×3 IMPLANT
ELECT REM PT RETURN 9FT ADLT (ELECTROSURGICAL) ×3
ELECTRODE REM PT RTRN 9FT ADLT (ELECTROSURGICAL) ×1 IMPLANT
GAUZE SPONGE 2X2 8PLY STRL LF (GAUZE/BANDAGES/DRESSINGS) ×1 IMPLANT
GAUZE SPONGE 4X4 12PLY STRL (GAUZE/BANDAGES/DRESSINGS) IMPLANT
GAUZE SPONGE 4X4 16PLY XRAY LF (GAUZE/BANDAGES/DRESSINGS) IMPLANT
GLOVE BIO SURGEON STRL SZ7 (GLOVE) ×3 IMPLANT
GLOVE BIOGEL PI IND STRL 7.0 (GLOVE) IMPLANT
GLOVE BIOGEL PI IND STRL 8 (GLOVE) IMPLANT
GLOVE BIOGEL PI INDICATOR 7.0 (GLOVE)
GLOVE BIOGEL PI INDICATOR 8 (GLOVE)
GLOVE ECLIPSE 7.0 STRL STRAW (GLOVE) ×6 IMPLANT
GLOVE ECLIPSE 8.0 STRL XLNG CF (GLOVE) IMPLANT
GLOVE EXAM NITRILE LRG STRL (GLOVE) IMPLANT
GLOVE EXAM NITRILE XL STR (GLOVE) IMPLANT
GLOVE EXAM NITRILE XS STR PU (GLOVE) IMPLANT
GLOVE INDICATOR 7.5 STRL GRN (GLOVE) ×3 IMPLANT
GLOVE SURG SS PI 7.0 STRL IVOR (GLOVE) ×3 IMPLANT
GOWN STRL REUS W/ TWL LRG LVL3 (GOWN DISPOSABLE) ×4 IMPLANT
GOWN STRL REUS W/ TWL XL LVL3 (GOWN DISPOSABLE) IMPLANT
GOWN STRL REUS W/TWL 2XL LVL3 (GOWN DISPOSABLE) IMPLANT
GOWN STRL REUS W/TWL LRG LVL3 (GOWN DISPOSABLE) ×8
GOWN STRL REUS W/TWL XL LVL3 (GOWN DISPOSABLE)
HEMOSTAT SURGICEL 2X14 (HEMOSTASIS) IMPLANT
KIT BASIN OR (CUSTOM PROCEDURE TRAY) ×3 IMPLANT
KIT ROOM TURNOVER OR (KITS) ×3 IMPLANT
MARKER SKIN DUAL TIP RULER LAB (MISCELLANEOUS) ×3 IMPLANT
NEEDLE HYPO 25X1 1.5 SAFETY (NEEDLE) ×3 IMPLANT
NS IRRIG 1000ML POUR BTL (IV SOLUTION) ×6 IMPLANT
OIL CARTRIDGE MAESTRO DRILL (MISCELLANEOUS) ×3
PACK LAMINECTOMY NEURO (CUSTOM PROCEDURE TRAY) ×3 IMPLANT
PAD ARMBOARD 7.5X6 YLW CONV (MISCELLANEOUS) ×6 IMPLANT
PASSER CATH 65CM DISP (NEUROSURGERY SUPPLIES) ×3 IMPLANT
SCISSORS LAP 5X35 DISP (ENDOMECHANICALS) IMPLANT
SET IRRIG TUBING LAPAROSCOPIC (IRRIGATION / IRRIGATOR) IMPLANT
SHEATH COOK PEEL AWAY SET 9F (SHEATH) ×3 IMPLANT
SHEATH PERITONEAL INTRO 61 (MISCELLANEOUS) ×3 IMPLANT
SHUNT STRATA 11 SNAP REG (Shunt) ×3 IMPLANT
SLEEVE ENDOPATH XCEL 5M (ENDOMECHANICALS) ×3 IMPLANT
SPONGE GAUZE 2X2 STER 10/PKG (GAUZE/BANDAGES/DRESSINGS) ×2
SPONGE LAP 4X18 X RAY DECT (DISPOSABLE) IMPLANT
SPONGE SURGIFOAM ABS GEL SZ50 (HEMOSTASIS) ×3 IMPLANT
STAPLER SKIN PROX WIDE 3.9 (STAPLE) ×3 IMPLANT
STAPLER VISISTAT 35W (STAPLE) ×3 IMPLANT
STRIP CLOSURE SKIN 1/2X4 (GAUZE/BANDAGES/DRESSINGS) ×2 IMPLANT
SUT ETHILON 3 0 PS 1 (SUTURE) ×3 IMPLANT
SUT MON AB 4-0 PC3 18 (SUTURE) ×3 IMPLANT
SUT NURALON 4 0 TR CR/8 (SUTURE) IMPLANT
SUT SILK 0 TIES 10X30 (SUTURE) ×3 IMPLANT
SUT SILK 3 0 SH 30 (SUTURE) ×3 IMPLANT
SUT VIC AB 3-0 SH 27 (SUTURE) ×2
SUT VIC AB 3-0 SH 27X BRD (SUTURE) ×1 IMPLANT
SUT VIC AB 3-0 SH 8-18 (SUTURE) ×6 IMPLANT
SYR 20CC LL (SYRINGE) ×3 IMPLANT
TOWEL GREEN STERILE (TOWEL DISPOSABLE) ×6 IMPLANT
TOWEL GREEN STERILE FF (TOWEL DISPOSABLE) ×3 IMPLANT
TRAY LAPAROSCOPIC MC (CUSTOM PROCEDURE TRAY) ×3 IMPLANT
TROCAR BLADELESS 5MM (ENDOMECHANICALS) ×3 IMPLANT
TROCAR XCEL BLUNT TIP 100MML (ENDOMECHANICALS) ×3 IMPLANT
TROCAR XCEL NON-BLD 5MMX100MML (ENDOMECHANICALS) IMPLANT
TUBE CONNECTING 12'X1/4 (SUCTIONS)
TUBE CONNECTING 12X1/4 (SUCTIONS) IMPLANT
TUBING INSUFFLATION (TUBING) ×3 IMPLANT
UNDERPAD 30X30 (UNDERPADS AND DIAPERS) ×3 IMPLANT
WATER STERILE IRR 1000ML POUR (IV SOLUTION) ×3 IMPLANT

## 2017-06-12 NOTE — Anesthesia Procedure Notes (Signed)
Procedure Name: Intubation Date/Time: 06/12/2017 12:27 PM Performed by: Neldon Newport Pre-anesthesia Checklist: Timeout performed, Patient being monitored, Suction available, Emergency Drugs available and Patient identified Patient Re-evaluated:Patient Re-evaluated prior to inductionOxygen Delivery Method: Circle system utilized Preoxygenation: Pre-oxygenation with 100% oxygen Intubation Type: IV induction and Rapid sequence Ventilation: Mask ventilation without difficulty Laryngoscope Size: Mac and 3 Grade View: Grade I Tube type: Oral Tube size: 7.5 mm Number of attempts: 1 Placement Confirmation: breath sounds checked- equal and bilateral,  positive ETCO2 and ETT inserted through vocal cords under direct vision Secured at: 23 cm Tube secured with: Tape Dental Injury: Teeth and Oropharynx as per pre-operative assessment

## 2017-06-12 NOTE — Progress Notes (Addendum)
No issues overnight.   EXAM:  BP 109/79 (BP Location: Right Arm)   Pulse 100   Temp 98.4 F (36.9 C) (Oral)   Resp 10   Ht 6\' 1"  (1.854 m)   Wt 65.4 kg (144 lb 2.9 oz)   SpO2 100%   BMI 19.02 kg/m   Awake, alert Speech fluent CN grossly intact  5/5 BUE/BLE   LABS: CSF culture from 6/15 remains negative for bacteria/yeast x3 days  IMPRESSION:  26 y.o. male with communicating HCP secondary to cryptococcal meningitis  PLAN: - Proceed with placement of lap assisted VPS  I have reviewed with the patient's family the indications, risks and benefits of shunt placement. All questions were answered.

## 2017-06-12 NOTE — Op Note (Signed)
PREOP DIAGNOSIS: Non-obstructive hydrocephalus  POSTOP DIAGNOSIS: Same  PROCEDURE: 1. Laparoscopic Assisted ventriculoperitoneal shunt placement  SURGEON: Dr. Lisbeth RenshawNeelesh Vinnie Gombert, MD  CO-SURGEON: Dr. Feliciana RossettiLuke Kinsinger, MD  ANESTHESIA: General Endotracheal  EBL: Minimal  SPECIMENS: None  DRAINS: None  COMPLICATIONS: None immediate  CONDITION: Stable to PACU  SHUNT PLACED: Medtronic Strata II, set to 1.5  HISTORY: Justin Ball is a 26 y.o. 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 He was initially treated for increased intracranial pressure with ventriculostomy. After this was removed and he has been managed with serial LP for progressive worsening of lethargy indicative of communicating hydrocephalus. Ventriculoperitoneal shunt was therefore indicated. Risks and benefits of the surgery were reviewed in detail with his family. After all questions were answered, consent was obtained.  PROCEDURE IN DETAIL: The patient was brought to the operating room and transferred to the operative table. After induction of general anesthesia, the patient was positioned on the operative table in the supine position with all pressure points meticulously padded. The skin of the scalp,  neck, chest, and abdomen were prepped in the usual sterile fashion.  The previously made right frontal scalp incision was opened sharply, and the previous bur hole was identified.  A tunnel was then created using a hemostat subcutaneously to the retroauricular region.  Counter incision was made behind the ear, and the distal shunt apparatus was then passed and the valve was seated subcutaneously.  The shunt passer was then used to tunnel from the retroauricular incision into the epigastrium with a clavicular counterincision.  Using standard anatomic landmarks a 6 cm ventricular catheter was then placed in the previously placed bur hole.  Good clear CSF flow was obtained.  The catheter was then connected to the valve apparatus.  The distal portion of the shunt was then placed in the intraperitoneal space under laparoscopic guidance by Dr. Sheliah HatchKinsinger, details of this procedure are dictated in a separate report. The valve was then pumped and good distal flow was observed.  At this point the wounds were irrigated with copious amounts of bacitracin irrigation, and closed using 3-0 Vicryl stitches.  The skin was then closed using standard surgical skin staples.  Sterile dressings were then applied.  At the end of the case all sponge needle and instrument counts were correct.  The patient tolerated the procedure well and was extubated in the room and taken to the postanesthesia care unit in stable condition.

## 2017-06-12 NOTE — Progress Notes (Signed)
    [facility]    Date of Admission:  05/20/2017     Reason for visit: Follow up on cryptococcal meningitis    Subjective: Lethargic this morning. Would open eyes to voice and answer in 1 word responses but then go back to sleep. Saw later in the morning with Dr. Drue SecondSnider and he did not recognize either one of us.  Medications:  . [MAR Hold] acetaminophen  650 mg Oral Q24H  . [MAR Hold] azithromycin  1,200 mg Per Tube Weekly  . [MAR Hold] chlorhexidine  15 mL Mouth Rinse BID  . [MAR Hold] Chlorhexidine Gluconate Cloth  6 each Topical Daily  . [MAR Hold] diphenhydrAMINE  25 mg Oral Daily  . [MAR Hold] flucytosine  1,500 mg Per Tube Q6H  . [MAR Hold] insulin aspart  2-6 Units Subcutaneous Q4H  . [MAR Hold] lidocaine (PF)  10 mL Intradermal Once  . [MAR Hold] mouth rinse  15 mL Mouth Rinse q12n4p  . [MAR Hold] metoprolol tartrate  5 mg Intravenous Q8H  . [MAR Hold] potassium chloride  40 mEq Oral Daily  . [MAR Hold] sulfamethoxazole-trimethoprim  10 mL Per Tube Daily    Objective: Vital signs in last 24 hours: Temp:  [97.6 F (36.4 C)-98.4 F (36.9 C)] 98.4 F (36.9 C) (06/19 0747) Pulse Rate:  [87-105] 100 (06/19 0747) Resp:  [10-17] 10 (06/19 0747) BP: (109-120)/(71-79) 109/79 (06/19 0747) SpO2:  [98 %-100 %] 100 % (06/19 0747) Weight:  [144 lb 2.9 oz (65.4 kg)] 144 lb 2.9 oz (65.4 kg) (06/19 0338)  General: Young man resting in bed, NAD CV: RRR, no m/g/r Neuro: lethargic, opens eyes to voice  Skin: no CSF leakage noted from suture site on right forehead  Lab Results  Recent Labs  06/11/17 0446 06/12/17 0352  WBC 2.3* 2.6*  HGB 10.1* 10.1*  HCT 30.4* 30.4*  NA 134* 134*  K 3.8 3.9  CL 104 104  CO2 24 23  BUN 11 12  CREATININE 0.63 0.64   Liver Panel  Recent Labs  06/11/17 0446 06/12/17 0352  ALBUMIN 2.7* 2.7*    Microbiology: 6/5 CSF cx>> staph epidermidis 6/12 CSF cx>> budding yeast 6/15 CSF cx>>  Negative 6/15 fungal CSF  cx>> negative     Assessment/Plan:  Increased intracranial pressure: Neurosurgery to do VPS placement today. No fungal growth in CSF cultures.   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 Will consider switching to Fluconazole in next 2 weeks as long as cultures remain negative. Continue monitoring electrolytes.   CoNS shunt culture: Day 14 of Linezolid. Will continue 2 days past shunt placement (through 6/21).   Leukopenia: Stable. WBC count 2.6.  HIV: Hold ART due to cryptococcal meningitis and risk of IRIS.   Encephalopathy: Lethargic today and seemed confused. Hopefully VPS placement will improve this.   Attending note to follow.   Rich Numberarly Bernita Beckstrom, MD, MPH Internal Medicine Resident, PGY-3 Pager: [phone]  06/12/2017, 1:31 PM

## 2017-06-12 NOTE — Progress Notes (Signed)
SLP Cancellation Note  Patient Details Name: Justin Ball MRN: [id number] DOB: 11/30/1991   Cancelled treatment:       Reason Eval/Treat Not Completed: Fatigue/lethargy limiting ability to participate; Mentation continues to limit safety and ability for PO readiness. ST will continue to monitor   Kennieth RadChelsea E Sumney 06/12/2017, 8:55 AM

## 2017-06-12 NOTE — Transfer of Care (Signed)
Immediate Anesthesia Transfer of Care Note  Patient: Justin Ball  Procedure(s) Performed: Procedure(s): SHUNT INSERTION VENTRICULAR-PERITONEAL (N/A) LAPAROSCOPIC REVISION VENTRICULAR-PERITONEAL (V-P) SHUNT (N/A)  Patient Location: PACU  Anesthesia Type:General  Level of Consciousness: awake, alert  and oriented  Airway & Oxygen Therapy: Patient Spontanous Breathing and Patient connected to nasal cannula oxygen  Post-op Assessment: Report given to RN, Post -op Vital signs reviewed and stable and Patient moving all extremities X 4  Post vital signs: Reviewed and stable  Last Vitals:  Vitals:   06/12/17 0338 06/12/17 0747  BP: 118/78 109/79  Pulse: (!) 102 100  Resp: 17 10  Temp: 36.4 C 36.9 C    Last Pain:  Vitals:   06/12/17 0747  TempSrc: Oral  PainSc:          Complications: No apparent anesthesia complications

## 2017-06-12 NOTE — Progress Notes (Signed)
Physical Therapy Cancellation Note    06/12/17 1302  PT Visit Information  Last PT Received On 06/12/17  Reason Eval/Treat Not Completed Patient at procedure or test/unavailable; Pt off unit for shunt placement. PT will continue to follow acutely.   History of Present Illness Pt is a 26 year old man admitted s/p seizure activity having several days of recent headaches. 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 EVD was placed after acute decompensation on 5/28 with decorticate posturing and asymmetric pupils. 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 PMH includes: syphilis, substance abuse, soft palate ulceration, scabies, neuromuscular disorder, HIV/AIDS, depression, bipolar disorder, anxiety   Erline LevineKellyn Elgie Landino, PTA Pager: [phone]

## 2017-06-12 NOTE — Progress Notes (Signed)
Pharmacy Consult Note:  Electrolyte Replacement  26 yom currently on treatment for cryptococcal meningitis and strep pneumo bacteremia. Pharmacy consulted by ID to manage electrolyte replacement.  Lytes wnl, K stable at 3.9, Mag up to 1.7 after constant replacement.  Plan: Replace electrolytes as needed Give Mg 2g IV x 2 doses today  Thank you for allowing pharmacy to be a part of this patient's care.  Enzo BiNathan Allen Egerton, PharmD, BCPS Clinical Pharmacist Pager [phone] 06/12/2017 7:55 AM

## 2017-06-12 NOTE — Progress Notes (Signed)
PROGRESS NOTE    Justin Ball  ZOX:[id number] DOB: 1991/03/04 DOA: 05/20/2017 PCP: Randall Hiss, MD  Outpatient Specialists:     Brief Narrative:  26 y/o ? HIV/AIDS diag 2013-high risk sexual behaviour  Documented virological failure on Tivicay/Truvada  anal intraepithelial neoplasia III s/p surgery Dr. Michaell Cowing 12/28/14  Rectal abscesses/submandibular abscesses Syphyllis Bipolar affecting care documented extensive medical noncompliance  Admit with unresponsiveness 5/28 initally found to have crytpo in CSF, Strep in blood  2/2 ICH and blown pupil, NS placed ventriculostomy for elevated ICP Extubated 05/26/17    Assessment & Plan:   Principal Problem:   Seizure (HCC) Active Problems:   HIV (human immunodeficiency virus infection) (HCC)   Rash and nonspecific skin eruption   Insomnia   Bipolar disorder (HCC)   Intersphincteric abscess   AIDS (HCC)   Headache   Meningoencephalitis   Respiratory failure (HCC)   Encephalopathy acute   Patient has nasogastric tube   History of syphilis   Tobacco abuse   Tachypnea   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 Rolanda Jay 6/19 - S/p ventriculostomy 5/28-6/8.  - Continue amphotericin and flucytosine per ID - Continue linezolid x14 days (6/6 - last dose on 6/19-stopped) - Continue keppra per neurology for seizure Tx - EVD removed 6/8  - LP repeated 6/12 with opening pressure - LP repeated 6/15 with opening pressure He is not oriented and is still somewhat confused.  We await gradual improvement with decline in CSF pressure  Acute encephalopathy:  Multifactorial with meningoencephalitis, seizures, ischemic infarcts (no antiplatelet per neuro), metabolic derangements and medications; likely complicated by delirium. - Numerous scattered areas of ischemia on MRI - Treat underlying conditions. - Ativan prn  agitation, off precedex since 6/8 - Avoid haldol with seizure disorder  - SLP evaluating, giving dysphagia 2 diet in addition to TF's  -evalaute without restraints in am, 6/20--today has been pulling at feeding tube  Streptococcus pneumoniae bacteremia - Linezolid per ID x14 days (6/6 - last dose on 6/19) - TTE ordered: No vegetations noted   Bordatella pneumonia - Was treated with doxycycline per ID, now off   AIDS: CD4 count April 2018 was 2.  - Hold ART per ID - Continue bactrim daily and weekly azithromycin ppx  Neutropenia: Suspected due to flucytosine.  - Monitor.  Diarrhea: Acute, likely from tube feeds.  - Flexiseal - Anti-diarrheals ok  Chronic hepatitis B - Follow up with ID    scd Inpatient pending resolution D/w granny a bedside For CIR once clears  Consultants:    Multiple  Procedures:    multiple  Antimicrobials:   See notes    Subjective:  Awake but confused Not alert nor orioented but does repea some words said When unrstraining UE's, he tries to touch NG tube He is less agitated but cannot verbalise well    Objective: Vitals:   06/12/17 1450 06/12/17 1505 06/12/17 1520 06/12/17 1535  BP: 124/75 126/81 130/73 125/82  Pulse: 87 97 96 96  Resp: 17 (!) 22 14 (!) 6  Temp:   97.9 F (36.6 C)   TempSrc:      SpO2: 100% 100% 100% 100%  Weight:      Height:        Intake/Output Summary (Last 24 hours) at 06/12/17 1552 Last data filed at 06/12/17 1505  Gross per 24 hour  Intake  4342 ml  Output             5600 ml  Net            -1258 ml   Filed Weights   06/10/17 0410 06/11/17 0408 06/12/17 0338  Weight: 64.4 kg (141 lb 15.6 oz) 65.6 kg (144 lb 10 oz) 65.4 kg (144 lb 2.9 oz)    Examination:   PERRLA Post op changes to R hemicranium NG tube in place S1-S2 no murmur rub or gallop Chest clinically clear no added sound Abdomen soft nontender nondistended no rebound Moving upper limbs to command.  Reflexes  not tested    Data Reviewed: I have personally reviewed following labs and imaging studies  CBC:  Recent Labs Lab 06/08/17 0411 06/09/17 0323 06/10/17 0459 06/11/17 0446 06/12/17 0352  WBC 2.6* 2.7* 2.6* 2.3* 2.6*  NEUTROABS 1.5* 1.6* 1.6* 1.6* 1.6*  HGB 10.6* 10.5* 10.5* 10.1* 10.1*  HCT 32.1* 31.5* 31.9* 30.4* 30.4*  MCV 89.4 89.7 89.4 89.9 88.4  PLT 274 241 253 254 246   Basic Metabolic Panel:  Recent Labs Lab 06/08/17 0411 06/09/17 0323 06/09/17 0500 06/10/17 0459 06/11/17 0446 06/12/17 0352  NA 134*  --  134* 136 134* 134*  K 4.0  --  4.3 4.0 3.8 3.9  CL 103  --  105 106 104 104  CO2 24  --  24 25 24 23   GLUCOSE 101*  --  89 91 96 103*  BUN 11  --  10 8 11 12   CREATININE 0.63  --  0.65 0.64 0.63 0.64  CALCIUM 8.9  --  8.8* 9.2 8.8* 9.0  MG 2.1 2.0  --  1.3* 1.6* 1.7  PHOS 5.9*  --  5.9* 5.5* 5.8* 6.1*   GFR: Estimated Creatinine Clearance: 129.4 mL/min (by C-G formula based on SCr of 0.64 mg/dL). Liver Function Tests:  Recent Labs Lab 06/08/17 0411 06/09/17 0500 06/10/17 0459 06/11/17 0446 06/12/17 0352  ALBUMIN 2.7* 2.6* 2.6* 2.7* 2.7*   No results for input(s): LIPASE, AMYLASE in the last 168 hours. No results for input(s): AMMONIA in the last 168 hours. Coagulation Profile: No results for input(s): INR, PROTIME in the last 168 hours. Cardiac Enzymes: No results for input(s): CKTOTAL, CKMB, CKMBINDEX, TROPONINI in the last 168 hours. BNP (last 3 results) No results for input(s): PROBNP in the last 8760 hours. HbA1C: No results for input(s): HGBA1C in the last 72 hours. CBG:  Recent Labs Lab 06/11/17 1648 06/11/17 1958 06/11/17 2251 06/12/17 0340 06/12/17 0745  GLUCAP 95 105* 76 77 80   Lipid Profile: No results for input(s): CHOL, HDL, LDLCALC, TRIG, CHOLHDL, LDLDIRECT in the last 72 hours. Thyroid Function Tests: No results for input(s): TSH, T4TOTAL, FREET4, T3FREE, THYROIDAB in the last 72 hours. Anemia Panel: No results for  input(s): VITAMINB12, FOLATE, FERRITIN, TIBC, IRON, RETICCTPCT in the last 72 hours. Urine analysis:    Component Value Date/Time   COLORURINE YELLOW 05/20/2017 1742   APPEARANCEUR CLEAR 05/20/2017 1742   LABSPEC 1.020 05/20/2017 1742   PHURINE 7.0 05/20/2017 1742   GLUCOSEU NEGATIVE 05/20/2017 1742   HGBUR NEGATIVE 05/20/2017 1742   BILIRUBINUR NEGATIVE 05/20/2017 1742   KETONESUR NEGATIVE 05/20/2017 1742   PROTEINUR NEGATIVE 05/20/2017 1742   UROBILINOGEN 4.0 (H) 09/09/2014 1055   NITRITE NEGATIVE 05/20/2017 1742   LEUKOCYTESUR NEGATIVE 05/20/2017 1742   Sepsis Labs: @LABRCNTIP (procalcitonin:4,lacticidven:4)  ) Recent Results (from the past 240 hour(s))  CSF culture     Status: None (  Preliminary result)   Collection Time: 06/05/17  9:39 AM  Result Value Ref Range Status   Specimen Description CSF  Final   Special Requests Immunocompromised  Final   Gram Stain   Final    WBC PRESENT, PREDOMINANTLY MONONUCLEAR BUDDING YEAST SEEN CRITICAL RESULT CALLED TO, READ BACK BY AND VERIFIED WITH: Buckman RN 10:20 06/05/17 ([email or web address])    Culture RARE YEAST IDENTIFICATION TO FOLLOW   Final   Report Status PENDING  Incomplete  Fungus Culture With Stain     Status: None (Preliminary result)   Collection Time: 06/05/17  9:40 AM  Result Value Ref Range Status   Fungus Stain Final report  Final    Comment: (NOTE) Performed At: [facility] [address] [place], [name] [id number] Mila Homer MD [id number]    Fungus (Mycology) Culture PENDING  Incomplete   Fungal Source CSF  Final  Fungus Culture Result     Status: None   Collection Time: 06/05/17  9:40 AM  Result Value Ref Range Status   Result 1 Yeast observed  Final    Comment: (NOTE) POSITIVE SMEAR REPORTED TO AMANDA L. AT 1101 ON 06/07/17 BY AM. Performed At: [facility] [address] [place], [name] [id number] Mila Homer MD LK:[id number]   CSF culture with Stat gram stain     Status: None  (Preliminary result)   Collection Time: 06/08/17  1:28 PM  Result Value Ref Range Status   Specimen Description CSF  Final   Special Requests Immunocompromised TUBE 2  Final   Gram Stain   Final    WBC PRESENT, PREDOMINANTLY MONONUCLEAR NO ORGANISMS SEEN CYTOSPIN SMEAR    Culture NO GROWTH 4 DAYS  Final   Report Status PENDING  Incomplete  Culture, fungus without smear     Status: None (Preliminary result)   Collection Time: 06/08/17  1:28 PM  Result Value Ref Range Status   Specimen Description CSF  Final   Special Requests Immunocompromised TUBE 2  Final   Culture NO FUNGUS ISOLATED AFTER 3 DAYS  Final   Report Status PENDING  Incomplete     Radiology Studies: No results found.   Scheduled Meds: . acetaminophen  650 mg Oral Q24H  . azithromycin  1,200 mg Per Tube Weekly  . chlorhexidine  15 mL Mouth Rinse BID  . Chlorhexidine Gluconate Cloth  6 each Topical Daily  . diphenhydrAMINE  25 mg Oral Daily  . flucytosine  1,500 mg Per Tube Q6H  . insulin aspart  2-6 Units Subcutaneous Q4H  . lidocaine (PF)  10 mL Intradermal Once  . mouth rinse  15 mL Mouth Rinse q12n4p  . metoprolol tartrate  5 mg Intravenous Q8H  . potassium chloride  40 mEq Oral Daily  . sulfamethoxazole-trimethoprim  10 mL Per Tube Daily   Continuous Infusions: . sodium chloride Stopped (06/05/17 0200)  . amphotericin  B  Liposome (AMBISOME) ADULT IV Stopped (06/11/17 2030)  . dextrose 5 % and 0.45% NaCl 50 mL/hr at 06/11/17 2300  . feeding supplement (VITAL AF 1.2 CAL) Stopped (06/11/17 2354)  . levETIRAcetam Stopped (06/12/17 1015)  . linezolid (ZYVOX) IV 0 mg (06/12/17 1100)  . magnesium sulfate 1 - 4 g bolus IVPB Stopped (06/12/17 0950)  . sodium chloride 1,000 mL (06/07/17 2200)  . sodium chloride       LOS: 23 days    Time spent50    Pleas Koch, MD [facility]   If 7PM-7AM, please contact night-coverage  [email or web address] Password [email or web address] 06/12/2017, 3:52 PM

## 2017-06-12 NOTE — Op Note (Signed)
Preoperative diagnosis: cryptococcus meningitis with increased ICP  Postoperative diagnosis: same   Procedure: insertion of VP shunt  Surgeon: Feliciana RossettiLuke Shalana Ball, M.D.  Co-surgeon: Justin Ball, M.D.  Anesthesia: general  Indications for procedure: Justin Ball is a 26 y.o. year old male with symptoms of seizures related to meningitis and increased ICP.  Description of procedure: The patient was brought into the operative suite. Anesthesia was administered with General endotracheal anesthesia. WHO checklist was applied. The patient was then placed in supine position. The area was prepped and draped in the usual sterile fashion.  Next, 0.25% marcaine was used to anesthetize the skin in the right subcostal area and a transverse incision was made in the area and a 5mm optical entry trocar was used to gain access to the peritoneum. Pneumoperitoneum was applied with high flow and low pressure. Laparoscope was reinserted to confirm placement. Next a 5mm trocar was inserted in the right periumbilical space. The abdomen was free of abnormality. A transverse incision was made in the epigastrium and a peel away sheath was introduced with Seldinger technique. Dr. Conchita Ball was then able to tunnel the catheter from cranial entry in the subcutaneous space to this epigastric incision. The catheter was then passed into the peel away sheath and sheath was removed. The catheter was tested and showed patency and liquid flow into the abdomen. The tip of the catheter was then placed into the pelvis area and all slack taken out of the subcutaneous tunnel. Incisions were then closed with 4-0 monocryl and the epigastric incision was closed in 2 layers with 4-0 monocryl. Dermabond was put in place for dressing   Findings: patent VP shunt  Specimen: none  Implant: VP shunt   Blood loss: <6130ml  Local anesthesia: 10 ml 0.5% marcaine   Complications: none  Feliciana RossettiLuke Lashea Ball, M.D. General, Bariatric, & Minimally Invasive  [facility]

## 2017-06-12 NOTE — Anesthesia Preprocedure Evaluation (Signed)
Anesthesia Evaluation  Patient identified by MRN, date of birth, ID band Patient awake    Reviewed: Allergy & Precautions, NPO status , Patient's Chart, lab work & pertinent test results  History of Anesthesia Complications Negative for: history of anesthetic complications  Airway Mallampati: II  TM Distance: >3 FB Neck ROM: Full    Dental  (+) Teeth Intact   Pulmonary Current Smoker,    breath sounds clear to auscultation       Cardiovascular negative cardio ROS   Rhythm:Regular     Neuro/Psych  Headaches, Seizures -,  PSYCHIATRIC DISORDERS Anxiety Depression Bipolar Disorder Cryptococcus meningitis   Neuromuscular disease negative psych ROS   GI/Hepatic negative GI ROS, Neg liver ROS,   Endo/Other  negative endocrine ROS  Renal/GU negative Renal ROS     Musculoskeletal negative musculoskeletal ROS (+)   Abdominal   Peds  Hematology  (+) anemia , HIV,   Anesthesia Other Findings   Reproductive/Obstetrics                             Anesthesia Physical Anesthesia Plan  ASA: III  Anesthesia Plan: General   Post-op Pain Management:    Induction: Intravenous  PONV Risk Score and Plan: 1 and Ondansetron  Airway Management Planned: Oral ETT  Additional Equipment: None  Intra-op Plan:   Post-operative Plan: Extubation in OR  Informed Consent: I have reviewed the patients History and Physical, chart, labs and discussed the procedure including the risks, benefits and alternatives for the proposed anesthesia with the patient or authorized representative who has indicated his/her understanding and acceptance.   Dental advisory given  Plan Discussed with: CRNA and Surgeon  Anesthesia Plan Comments:         Anesthesia Quick Evaluation

## 2017-06-13 ENCOUNTER — Inpatient Hospital Stay (HOSPITAL_COMMUNITY): Payer: No Typology Code available for payment source

## 2017-06-13 ENCOUNTER — Encounter (HOSPITAL_COMMUNITY): Payer: Self-pay | Admitting: Neurosurgery

## 2017-06-13 DIAGNOSIS — G910 Communicating hydrocephalus: Secondary | ICD-10-CM

## 2017-06-13 LAB — HEPATIC FUNCTION PANEL
ALBUMIN: 2.5 g/dL — AB (ref 3.5–5.0)
ALK PHOS: 134 U/L — AB (ref 38–126)
ALT: 69 U/L — AB (ref 17–63)
AST: 65 U/L — AB (ref 15–41)
Bilirubin, Direct: 0.2 mg/dL (ref 0.1–0.5)
Indirect Bilirubin: 0 mg/dL — ABNORMAL LOW (ref 0.3–0.9)
TOTAL PROTEIN: 7.4 g/dL (ref 6.5–8.1)
Total Bilirubin: 0.2 mg/dL — ABNORMAL LOW (ref 0.3–1.2)

## 2017-06-13 LAB — CBC WITH DIFFERENTIAL/PLATELET
Basophils Absolute: 0 10*3/uL (ref 0.0–0.1)
Basophils Relative: 1 %
EOS ABS: 0.1 10*3/uL (ref 0.0–0.7)
Eosinophils Relative: 3 %
HEMATOCRIT: 29 % — AB (ref 39.0–52.0)
HEMOGLOBIN: 9.5 g/dL — AB (ref 13.0–17.0)
LYMPHS ABS: 0.8 10*3/uL (ref 0.7–4.0)
LYMPHS PCT: 30 %
MCH: 29.2 pg (ref 26.0–34.0)
MCHC: 32.8 g/dL (ref 30.0–36.0)
MCV: 89.2 fL (ref 78.0–100.0)
MONOS PCT: 10 %
Monocytes Absolute: 0.3 10*3/uL (ref 0.1–1.0)
NEUTROS ABS: 1.5 10*3/uL — AB (ref 1.7–7.7)
NEUTROS PCT: 56 %
Platelets: 258 10*3/uL (ref 150–400)
RBC: 3.25 MIL/uL — ABNORMAL LOW (ref 4.22–5.81)
RDW: 14.1 % (ref 11.5–15.5)
WBC: 2.7 10*3/uL — ABNORMAL LOW (ref 4.0–10.5)

## 2017-06-13 LAB — RENAL FUNCTION PANEL
ANION GAP: 6 (ref 5–15)
Albumin: 2.5 g/dL — ABNORMAL LOW (ref 3.5–5.0)
BUN: 7 mg/dL (ref 6–20)
CHLORIDE: 106 mmol/L (ref 101–111)
CO2: 23 mmol/L (ref 22–32)
Calcium: 8.6 mg/dL — ABNORMAL LOW (ref 8.9–10.3)
Creatinine, Ser: 0.81 mg/dL (ref 0.61–1.24)
GFR calc non Af Amer: >60 mL/min (ref >60)
GLUCOSE: 91 mg/dL (ref 65–99)
POTASSIUM: 4.3 mmol/L (ref 3.5–5.1)
Phosphorus: 6.5 mg/dL — ABNORMAL HIGH (ref 2.5–4.6)
Sodium: 135 mmol/L (ref 135–145)

## 2017-06-13 LAB — GLUCOSE, CAPILLARY
GLUCOSE-CAPILLARY: 66 mg/dL (ref 65–99)
GLUCOSE-CAPILLARY: 86 mg/dL (ref 65–99)
GLUCOSE-CAPILLARY: 105 mg/dL — AB (ref 65–99)
GLUCOSE-CAPILLARY: 104 mg/dL — AB (ref 65–99)
Glucose-Capillary: 103 mg/dL — ABNORMAL HIGH (ref 65–99)

## 2017-06-13 LAB — MAGNESIUM: Magnesium: 1.8 mg/dL (ref 1.7–2.4)

## 2017-06-13 MED ORDER — LINEZOLID 600 MG PO TABS
600.0000 mg | ORAL_TABLET | Freq: Two times a day (BID) | ORAL | Status: DC
  Administered 2017-06-13 – 2017-06-15 (×5): 600 mg via ORAL
  Filled 2017-06-13 (×5): qty 1

## 2017-06-13 MED ORDER — ALTEPLASE 2 MG IJ SOLR
2.0000 mg | Freq: Once | INTRAMUSCULAR | Status: AC
  Administered 2017-06-13: 2 mg
  Filled 2017-06-13: qty 2

## 2017-06-13 MED ORDER — HYDROCODONE-ACETAMINOPHEN 5-325 MG PO TABS
1.0000 | ORAL_TABLET | Freq: Three times a day (TID) | ORAL | Status: DC | PRN
  Administered 2017-06-14: 1 via ORAL
  Filled 2017-06-13: qty 1

## 2017-06-13 MED ORDER — MAGNESIUM SULFATE 2 GM/50ML IV SOLN
2.0000 g | Freq: Four times a day (QID) | INTRAVENOUS | Status: AC
  Administered 2017-06-13 (×2): 2 g via INTRAVENOUS
  Filled 2017-06-13 (×2): qty 50

## 2017-06-13 NOTE — Progress Notes (Signed)
Progress Note: General Surgery Service   Assessment/Plan: Patient Active Problem List   Diagnosis Date Noted  . Patient has nasogastric tube   . History of syphilis   . Tobacco abuse   . Tachypnea   . Encephalopathy acute   . Meningoencephalitis   . Respiratory failure (HCC)   . Headache 05/20/2017  . Seizure (HCC) 05/20/2017  . Leukopenia 03/06/2017  . Peri-rectal abscess 03/06/2017  . 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 Ulcer of soft palate 09/12/2016  . Acute hypokalemia 09/12/2016  . Anemia 09/12/2016  . Alcohol use 09/12/2016  . Oral abscess 09/12/2016  . Abscess   . AIDS (HCC) 01/19/2015  . 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 H/O noncompliance with medical treatment - finincial, presenting hazards to health 12/28/2014  . Rectal abscess 12/28/2014  . Intersphincteric abscess 12/28/2014  . Bipolar disorder (HCC) 04/08/2013  . Muscle ache 01/06/2013  . Lower back pain 01/06/2013  . Insomnia 01/06/2013  . HIV (human immunodeficiency virus infection) (HCC) 10/10/2012  . Rash and nonspecific skin eruption 10/10/2012  . Rectal discharge 10/10/2012  . Syphilis 10/10/2012   s/p Procedure(s): SHUNT INSERTION VENTRICULAR-PERITONEAL LAPAROSCOPIC REVISION VENTRICULAR-PERITONEAL (V-P) SHUNT 06/12/2017  Ok for any diet and will defer to primary given neurologic status No follow up necessary   LOS: 24 days  Chief Complaint/Subjective: Headache improved from yesterday, some abdominal pain, no vomiting  Objective: Vital signs in last 24 hours: Temp:  [97.3 F (36.3 C)-98.2 F (36.8 C)] 97.6 F (36.4 C) (06/20 0723) Pulse Rate:  [76-97] 76 (06/20 0723) Resp:  [6-22] 18 (06/20 0723) BP: (98-130)/(61-82) 100/61 (06/20 0723) SpO2:  [97 %-100 %] 100 % (06/20 0723) Weight:  [69.1 kg (152 lb 5.4 oz)] 69.1 kg (152 lb 5.4 oz) (06/20 0438) Last BM Date: 06/12/17  Intake/Output from previous day: 06/19 0701 - 06/20 0700 In: 3781.8 [I.V.:2552; NG/GT:369.8; IV  Piggyback:760] Out: 4800 [Urine:4700; Blood:100] Intake/Output this shift: Total I/O In: -  Out: 1000 [Urine:1000]  Abd: incisions c/d/i no erythema  Extremities: thin  Neuro: arousable with garbled utterances to questions  Lab Results: CBC   Recent Labs  06/12/17 0352 06/13/17 0306  WBC 2.6* 2.7*  HGB 10.1* 9.5*  HCT 30.4* 29.0*  PLT 246 258   BMET  Recent Labs  06/12/17 0352 06/13/17 0306  NA 134* 135  K 3.9 4.3  CL 104 106  CO2 23 23  GLUCOSE 103* 91  BUN 12 7  CREATININE 0.64 0.81  CALCIUM 9.0 8.6*   PT/INR No results for input(s): LABPROT, INR in the last 72 hours. ABG No results for input(s): PHART, HCO3 in the last 72 hours.  Invalid input(s): PCO2, PO2  Studies/Results:  Anti-infectives: Anti-infectives    Start     Dose/Rate Route Frequency Ordered Stop   06/12/17 1354  bacitracin 50,000 Units in sodium chloride irrigation 0.9 % 500 mL irrigation  Status:  Discontinued       As needed 06/12/17 1355 06/12/17 1430   05/31/17 1000  azithromycin (ZITHROMAX) 200 MG/5ML suspension 1,200 mg     1,200 mg Per Tube Weekly 05/24/17 1315     05/30/17 1130  linezolid (ZYVOX) IVPB 600 mg  Status:  Discontinued     600 mg 300 mL/hr over 60 Minutes Intravenous Every 12 hours 05/30/17 1127 06/12/17 1656   05/26/17 1215  doxycycline (VIBRAMYCIN) 100 mg in dextrose 5 % 250 mL IVPB  Status:  Discontinued     100 mg 125 mL/hr over 120  Minutes Intravenous Every 12 hours 05/26/17 1212 06/01/17 1448   05/26/17 0554  cefTRIAXone (ROCEPHIN) 2 g in dextrose 5 % 50 mL IVPB  Status:  Discontinued     2 g 100 mL/hr over 30 Minutes Intravenous Every 24 hours 05/25/17 1358 05/31/17 1323   05/24/17 1230  azithromycin (ZITHROMAX) 1,200 mg in dextrose 5 % 250 mL IVPB     1,200 mg 250 mL/hr over 60 Minutes Intravenous Weekly 05/24/17 1128 05/24/17 1407   05/21/17 0800  emtricitabine-rilpivir-tenofovir AF (ODEFSEY) 200-25-25 MG per tablet 1 tablet  Status:  Discontinued     1  tablet Oral Daily with breakfast 05/20/17 1813 05/20/17 1909   05/21/17 0600  vancomycin (VANCOCIN) IVPB 1000 mg/200 mL premix  Status:  Discontinued     1,000 mg 200 mL/hr over 60 Minutes Intravenous Every 8 hours 05/21/17 0549 05/21/17 1400   05/21/17 0545  cefTRIAXone (ROCEPHIN) 2 g in dextrose 5 % 50 mL IVPB  Status:  Discontinued     2 g 100 mL/hr over 30 Minutes Intravenous Every 12 hours 05/21/17 0531 05/25/17 1358   05/21/17 0100  cefTRIAXone (ROCEPHIN) 2 g in dextrose 5 % 50 mL IVPB  Status:  Discontinued     2 g 100 mL/hr over 30 Minutes Intravenous Every 12 hours 05/20/17 1245 05/20/17 1919   05/20/17 2100  vancomycin (VANCOCIN) IVPB 1000 mg/200 mL premix  Status:  Discontinued     1,000 mg 200 mL/hr over 60 Minutes Intravenous Every 8 hours 05/20/17 1245 05/20/17 1918   05/20/17 2000  ampicillin (OMNIPEN) 1 g in sodium chloride 0.9 % 50 mL IVPB  Status:  Discontinued     1 g 150 mL/hr over 20 Minutes Intravenous Every 4 hours 05/20/17 1833 05/20/17 1836   05/20/17 2000  ampicillin (OMNIPEN) 2 g in sodium chloride 0.9 % 50 mL IVPB  Status:  Discontinued     2 g 150 mL/hr over 20 Minutes Intravenous Every 4 hours 05/20/17 1836 05/20/17 1919   05/20/17 2000  sulfamethoxazole-trimethoprim (BACTRIM,SEPTRA) 200-40 MG/5ML suspension 10 mL     10 mL Per Tube Daily 05/20/17 1901     05/20/17 2000  emtricitabine-tenofovir AF (DESCOVY) 200-25 MG per tablet 1 tablet  Status:  Discontinued     1 tablet Oral Daily 05/20/17 1909 05/20/17 1919   05/20/17 2000  rilpivirine (EDURANT) tablet 25 mg  Status:  Discontinued     25 mg Per Tube Daily with breakfast 05/20/17 1909 05/20/17 1919   05/20/17 2000  flucytosine (ANCOBON) capsule 1,500 mg     1,500 mg Per Tube Every 6 hours 05/20/17 1926     05/20/17 1815  dolutegravir (TIVICAY) tablet 50 mg  Status:  Discontinued     50 mg Oral Daily 05/20/17 1813 05/20/17 1919   05/20/17 1815  sulfamethoxazole-trimethoprim (BACTRIM,SEPTRA) 400-80 MG per  tablet 1 tablet  Status:  Discontinued     1 tablet Oral Daily 05/20/17 1813 05/20/17 1901   05/20/17 1800  flucytosine (ANCOBON) capsule 1,500 mg  Status:  Discontinued     25 mg/kg  63.5 kg Per Tube Every 6 hours 05/20/17 1645 05/20/17 1926   05/20/17 1700  amphotericin B liposome (AMBISOME) 250 mg in dextrose 5 % 500 mL IVPB     4 mg/kg  63.5 kg 250 mL/hr over 120 Minutes Intravenous Every 24 hours 05/20/17 1645     05/20/17 1300  ampicillin (OMNIPEN) 2 g in sodium chloride 0.9 % 50 mL IVPB  Status:  Discontinued  2 g 150 mL/hr over 20 Minutes Intravenous  Once 05/20/17 1237 05/20/17 1242   05/20/17 1300  acyclovir (ZOVIRAX) 635 mg in dextrose 5 % 100 mL IVPB  Status:  Discontinued     10 mg/kg  63.5 kg 112.7 mL/hr over 60 Minutes Intravenous Every 8 hours 05/20/17 1243 05/20/17 1918   05/20/17 1245  cefTRIAXone (ROCEPHIN) 2 g in dextrose 5 % 50 mL IVPB     2 g 100 mL/hr over 30 Minutes Intravenous  Once 05/20/17 1237 05/20/17 1327   05/20/17 1245  vancomycin (VANCOCIN) IVPB 1000 mg/200 mL premix     1,000 mg 200 mL/hr over 60 Minutes Intravenous  Once 05/20/17 1237 05/20/17 1420      Medications: Scheduled Meds: . acetaminophen  650 mg Oral Q24H  . azithromycin  1,200 mg Per Tube Weekly  . chlorhexidine  15 mL Mouth Rinse BID  . Chlorhexidine Gluconate Cloth  6 each Topical Daily  . diphenhydrAMINE  25 mg Oral Daily  . flucytosine  1,500 mg Per Tube Q6H  . insulin aspart  2-6 Units Subcutaneous Q4H  . lidocaine (PF)  10 mL Intradermal Once  . mouth rinse  15 mL Mouth Rinse q12n4p  . metoprolol tartrate  5 mg Intravenous Q8H  . potassium chloride  40 mEq Oral Daily  . sulfamethoxazole-trimethoprim  10 mL Per Tube Daily   Continuous Infusions: . sodium chloride Stopped (06/05/17 0200)  . amphotericin  B  Liposome (AMBISOME) ADULT IV Stopped (06/12/17 2100)  . dextrose 5 % and 0.45% NaCl 50 mL/hr at 06/12/17 2247  . feeding supplement (VITAL AF 1.2 CAL) 1,000 mL  (06/13/17 0300)  . levETIRAcetam 1,000 mg (06/13/17 16100838)  . magnesium sulfate 1 - 4 g bolus IVPB 2 g (06/13/17 0839)  . sodium chloride 1,000 mL (06/07/17 2200)  . sodium chloride     PRN Meds:.sodium chloride, acetaminophen, albuterol, HYDROcodone-acetaminophen, LORazepam, metoprolol tartrate, ondansetron (ZOFRAN) IV, sodium chloride flush  Rodman PickleLuke Aaron Abisai Coble, MD Pg# [phone] [facility]

## 2017-06-13 NOTE — Progress Notes (Signed)
Nutrition Follow-up  DOCUMENTATION CODES:   Non-severe (moderate) malnutrition in context of chronic illness  INTERVENTION:    Continue Vital AF 1.2 at 75 ml/h via Cortrak tube to meet 100% of nutrition needs.  Diet advancement per SLP/MD as able.  Can transition to nocturnal feedings when diet is resumed. Vital AF 1.2 at 75 ml/h x 12 hours would meet ~50% of estimated needs.  NUTRITION DIAGNOSIS:   Malnutrition (Moderate) related to chronic illness (AIDS) as evidenced by mild depletion of muscle mass, mild depletion of body fat.  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  Discussed patient with RN today. S/P VP shunt placement on 6/19. Remains NPO, previously on dysphagia 2 diet with thin liquids per SLP. Currently receiving Vital AF 1.2 via Cortrak tube at 75 ml/h (1800 ml/day) to provide 2160 kcals, 135 gm protein, 1460 ml free water daily.  Labs reviewed. Medications reviewed and include KCl and magnesium sulfate.  Diet Order:  Diet NPO time specified  Skin:   (incisions)  Last BM:  6/20  Height:   Ht Readings from Last 1 Encounters:  05/20/17 '6\' 1"'  (1.854 m)    Weight:   Wt Readings from Last 1 Encounters:  06/13/17 152 lb 5.4 oz (69.1 kg)    Ideal Body Weight:  83.6 kg  BMI:  Body mass index is 20.1 kg/m.  Estimated Nutritional Needs:   Kcal:  2100-2300  Protein:  100-125 grams  Fluid:  >/= 2.1 L/day  EDUCATION NEEDS:   No education needs identified at this time  Molli Barrows, [place], [place], [place] Pager [phone] After Hours Pager  [phone]

## 2017-06-13 NOTE — Progress Notes (Signed)
    [facility]    Date of Admission:  05/20/2017     Reason for visit: Follow up on cryptococcal meningitis    Subjective: Tired this morning. Just received pain medication. Per nurse, he was alert and oriented to person, year, and hospital before getting pain medication. Yeast seen on gram stain from 6/15 but no growth in culture.   Medications:  . acetaminophen  650 mg Oral Q24H  . azithromycin  1,200 mg Per Tube Weekly  . chlorhexidine  15 mL Mouth Rinse BID  . Chlorhexidine Gluconate Cloth  6 each Topical Daily  . diphenhydrAMINE  25 mg Oral Daily  . flucytosine  1,500 mg Per Tube Q6H  . insulin aspart  2-6 Units Subcutaneous Q4H  . lidocaine (PF)  10 mL Intradermal Once  . mouth rinse  15 mL Mouth Rinse q12n4p  . metoprolol tartrate  5 mg Intravenous Q8H  . potassium chloride  40 mEq Oral Daily  . sulfamethoxazole-trimethoprim  10 mL Per Tube Daily    Objective: Vital signs in last 24 hours: Temp:  [97.3 F (36.3 C)-98.2 F (36.8 C)] 98.1 F (36.7 C) (06/20 1157) Pulse Rate:  [76-102] 102 (06/20 1157) Resp:  [6-22] 14 (06/20 1157) BP: (98-130)/(61-82) 119/70 (06/20 1157) SpO2:  [97 %-100 %] 99 % (06/20 1157) Weight:  [152 lb 5.4 oz (69.1 kg)] 152 lb 5.4 oz (69.1 kg) (06/20 0438)  General: Young man resting in bed, NAD Neuro: lethargic, opens eyes to voice  Skin: Wound on forehead appears clean, dry, intact, clean bandages.  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 Will need follow up with Neurosurgery as outpatient.   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 Gram stain from 6/15 shows yeast, but this could represent dead crypto. CSF fungal cx remains negative.   CoNS shunt culture: Day 15 of Linezolid. Will continue 2 days past shunt placement (through 6/21).   Leukopenia: Stable. Likely related to flucytosine.   HIV: Hold ART due to cryptococcal meningitis and risk of IRIS.   Encephalopathy: Improving. Likely lethargic this morning due to pain medication. Would avoid sedating medications if possible.   Chronic hepatitis B: Has been off of ART. Will check LFTs to ensure liver enzymes stable>> AST/ALT better than previously on 6/11.  Attending note to follow.   Rich Numberarly Rivet, MD, MPH Internal Medicine Resident, PGY-3 Pager: [phone]  06/13/2017, 2:02 PM

## 2017-06-13 NOTE — Progress Notes (Signed)
CRITICAL VALUE ALERT  Critical Value:  Incorrect notification posted on 06/08/17. Budding yeast found in spinal fluid on gram stain, but no growth on plate.   Date & Time Notified:  06/13/17 1004  Provider Notified: Dr. Drue SecondSnider  Orders Received/Actions taken: MD stated they would follow up with lab.   Leanna BattlesEckelmann, Krissy Orebaugh Eileen, RN

## 2017-06-13 NOTE — Anesthesia Postprocedure Evaluation (Signed)
Anesthesia Post Note  Patient: Justin Ball  Procedure(s) Performed: Procedure(s) (LRB): SHUNT INSERTION VENTRICULAR-PERITONEAL (N/A) LAPAROSCOPIC REVISION VENTRICULAR-PERITONEAL (V-P) SHUNT (N/A)     Patient location during evaluation: PACU Anesthesia Type: General Level of consciousness: awake and patient cooperative Pain management: pain level controlled Vital Signs Assessment: post-procedure vital signs reviewed and stable Respiratory status: spontaneous breathing, nonlabored ventilation, respiratory function stable and patient connected to nasal cannula oxygen Cardiovascular status: stable Postop Assessment: no signs of nausea or vomiting Anesthetic complications: no    Last Vitals:  Vitals:   06/13/17 0347 06/13/17 0723  BP: 98/61 100/61  Pulse: 81   Resp: 13   Temp: 36.3 C 36.4 C    Last Pain:  Vitals:   06/13/17 0723  TempSrc: Axillary  PainSc:                  Krishana Lutze

## 2017-06-13 NOTE — Progress Notes (Signed)
Physical Therapy Treatment Patient Details Name: Justin Ball R Manson MRN: [id number] DOB: 06/10/1991 Today's Date: 06/13/2017    History of Present Illness Pt is a 26 year old man admitted s/p seizure activity having several days of recent headaches. 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 EVD was placed after acute decompensation on 5/28 with decorticate posturing and asymmetric pupils. 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 PMH includes: syphilis, substance abuse, soft palate ulceration, scabies, neuromuscular disorder, HIV/AIDS, depression, bipolar disorder, anxiety    PT Comments    Patient is making progress with mobility and cognition post VP shunt. Pt is oriented to person and place and following commands more consistently.  Pt demonstrated improved activity tolerance and much more participatory. Continue to recommend CIR for further skilled PT services.   Follow Up Recommendations  CIR     Equipment Recommendations  Wheelchair (measurements PT);Wheelchair cushion (measurements PT);Hospital bed    Recommendations for Other Services Rehab consult     Precautions / Restrictions Precautions Precautions: Fall Precaution Comments: flexiseal, foley, NG tube, IV  Restrictions Weight Bearing Restrictions: No    Mobility  Bed Mobility Overal bed mobility: Needs Assistance Bed Mobility: Supine to Sit     Supine to sit: HOB elevated;Mod assist;+2 for safety/equipment Sit to supine: Mod assist;+2 for safety/equipment   General bed mobility comments: multimodal cues to initiate task; pt able to bring bilat LE toward EOB and reached for therapist to assist sitting up; assist at LE and trunk; use of bed pad to bring hips toward EOB but pt attempted to scoot with vc  Transfers Overall transfer level: Needs assistance Equipment used: Rolling walker (2 wheeled) Transfers: Sit to/from Stand Sit to Stand: Mod  assist;+2 safety/equipment         General transfer comment: assist to power up into standing and for balance upon standing; X1 with HHA +2 and X1 with use of RW upon standing; with AD pt able to maintain static standing balance with min/mod A +1 with cues to maintain hand placement on RW  Ambulation/Gait Ambulation/Gait assistance: +2 physical assistance;Mod assist Ambulation Distance (Feet): 2 Feet Assistive device: Rolling walker (2 wheeled) Gait Pattern/deviations: Step-to pattern;Narrow base of support;Leaning posteriorly     General Gait Details: pt able to take side steps with RW and assist for weight shifting and balance; multimodal cues for sequencing and assist to manage RW   Stairs            Wheelchair Mobility    Modified Rankin (Stroke Patients Only)       Balance Overall balance assessment: Needs assistance Sitting-balance support: Feet supported;Bilateral upper extremity supported Sitting balance-Leahy Scale: Poor Sitting balance - Comments: pt maintained sitting balance EOB with min guard assist for safety   Standing balance support: Bilateral upper extremity supported Standing balance-Leahy Scale: Poor Standing balance comment: pt requires UE support for balance; posterior bias and narrow BOS                            Cognition Arousal/Alertness: Awake/alert Behavior During Therapy: Flat affect Overall Cognitive Status: Impaired/Different from baseline Area of Impairment: Attention;Following commands;Safety/judgement;Awareness;Problem solving;Orientation                 Orientation Level: Disoriented to;Time;Situation Current Attention Level: Focused   Following Commands: Follows one step commands with increased time Safety/Judgement: Decreased awareness of safety;Decreased awareness of deficits Awareness: Intellectual Problem Solving: Slow processing;Decreased initiation;Difficulty sequencing;Requires verbal  cues;Requires  tactile cues General Comments: pt with improved cognition today; able to state he's in the hospital and his name and birthday      Exercises      General Comments General comments (skin integrity, edema, etc.): HR up to 132 with OOB mobility      Pertinent Vitals/Pain Pain Assessment: Faces Faces Pain Scale: Hurts little more Pain Location: pt did not state--grimacing with supine to sit Pain Descriptors / Indicators: Grimacing Pain Intervention(s): Limited activity within patient's tolerance;Monitored during session;Repositioned    Home Living                      Prior Function            PT Goals (current goals can now be found in the care plan section) Acute Rehab PT Goals Patient Stated Goal: unable to state Progress towards PT goals: Progressing toward goals    Frequency    Min 3X/week      PT Plan Current plan remains appropriate;Frequency needs to be updated (freq updated to match dx group)    Co-evaluation              AM-PAC PT "6 Clicks" Daily Activity  Outcome Measure  Difficulty turning over in bed (including adjusting bedclothes, sheets and blankets)?: Total Difficulty moving from lying on back to sitting on the side of the bed? : Total Difficulty sitting down on and standing up from a chair with arms (e.g., wheelchair, bedside commode, etc,.)?: Total Help needed moving to and from a bed to chair (including a wheelchair)?: A Lot Help needed walking in hospital room?: A Lot Help needed climbing 3-5 steps with a railing? : Total 6 Click Score: 8    End of Session Equipment Utilized During Treatment: Gait belt Activity Tolerance: Patient tolerated treatment well Patient left: in bed;with call bell/phone within reach;with bed alarm set;with restraints reapplied;with SCD's reapplied Nurse Communication: Mobility status PT Visit Diagnosis: Other symptoms and signs involving the nervous system (R29.898);Muscle weakness (generalized)  (M62.81);Difficulty in walking, not elsewhere classified (R26.2)     Time: 1610-9604 PT Time Calculation (min) (ACUTE ONLY): 33 min  Charges:  $Therapeutic Activity: 23-37 mins                    G Codes:       Erline Levine, PTA Pager: [phone]     Carolynne Edouard 06/13/2017, 4:29 PM

## 2017-06-13 NOTE — Progress Notes (Signed)
No issues overnight.   EXAM:  BP 100/61 (BP Location: Right Arm)   Pulse 76   Temp 97.6 F (36.4 C) (Axillary)   Resp 18   Ht 6\' 1"  (1.854 m)   Wt 69.1 kg (152 lb 5.4 oz)   SpO2 100%   BMI 20.10 kg/m   Awake, alert, oriented to person, hospital CN grossly intact  5/5 BUE/BLE  Wound c/d/i  IMPRESSION:  26 y.o. male POD#1 s/p right VPS for communicating HCP related to cryptococcal meningitis. Doing well.  PLAN: - F/U in my office in 2 weeks for staple removal, or can be removed at that time in CIR

## 2017-06-13 NOTE — Progress Notes (Signed)
Pharmacy Consult Note:  Electrolyte Replacement  26 yom currently on treatment for cryptococcal meningitis and strep pneumo bacteremia. Pharmacy consulted by ID to manage electrolyte replacement.  Lytes wnl, K at 4.3, Mag up to 1.8, Phos up to 6.5. On potassium 40mEq daily.  Plan: Give Mg 2g IV x 2 doses again today  Thank you for allowing pharmacy to be a part of this patient's care.  Enzo BiNathan Tihanna Goodson, PharmD, BCPS Clinical Pharmacist Pager [phone] 06/13/2017 8:01 AM

## 2017-06-13 NOTE — Progress Notes (Signed)
I await therapy and medical progress postop VP shunt to assist with determining rehab venue options with pt's Mom and Grandmother. I will follow. [phone]

## 2017-06-13 NOTE — Progress Notes (Addendum)
PROGRESS NOTE    Justin Ball R Bernards  UJW:[id number] DOB: 03/07/1991 DOA: 05/20/2017 PCP: Justin Ball, Justin N, MD  Outpatient Specialists:     Brief Narrative:  26 y/o ? HIV/AIDS diag 2013-high risk sexual behaviour  Documented virological failure on Tivicay/Truvada  anal intraepithelial neoplasia III s/p surgery Dr. [name] 12/28/14  Rectal abscesses/submandibular abscesses Syphyllis Bipolar affecting care documented extensive medical noncompliance  Admit with unresponsiveness 5/28 initally found to have crytpo in CSF, Strep in blood  2/2 ICH and blown pupil, NS placed ventriculostomy for elevated ICP Extubated 05/26/17    Assessment & Plan:   Principal Problem:   Seizure (HCC) Active Problems:   HIV (human immunodeficiency virus infection) (HCC)   Rash and nonspecific skin eruption   Insomnia   Bipolar disorder (HCC)   Intersphincteric abscess   AIDS (HCC)   Headache   Meningoencephalitis   Respiratory failure (HCC)   Encephalopathy acute   Patient has nasogastric tube   History of syphilis   Tobacco abuse   Tachypnea   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 Rolanda Ball 6/19 - S/p ventriculostomy 5/28-6/8.  - Continue amphotericin and flucytosine per ID - Continue linezolid as per ID - Continue keppra per neurology for seizure Tx - EVD removed 6/8  - LP repeated 6/12 with opening pressure 27mmHg - LP repeated 6/15 with opening pressure 19mmHg  Deferred to infectious disease planning with regards to CSF culture not  growing anything currently  ?Might be able to D escalate off of amphotericin B as yeast not present   Acute encephalopathy:  Multifactorial with meningoencephalitis, seizures, ischemic infarcts (no antiplatelet per neuro), metabolic derangements and medications; likely complicated by delirium. - Numerous scattered areas of ischemia on MRI - Treat underlying  conditions. - Ativan prn agitation, off precedex since 6/8 - Avoid haldol with seizure disorder  He is less disoriented but still confused [facility]  await gradual improvement with decline in CSF pressure Cut back NOrco to q 8 prn severe pain ID to address Pre-med of IV benadryl as per Amphotericin B protocol Ativan necessary to prevent self harm--trying to get him off restraints Have asked speech therapy to reevaluate for cognitive eval as was last seen on 6/15  Streptococcus pneumoniae bacteremia - Linezolid per ID x14 days (6/6 - last dose on 6/19) - TTE ordered: No vegetations noted   Bordatella pneumonia - Was treated with doxycycline per ID, now off   AIDS: CD4 count April 2018 was 2.  - Hold ART per ID - Continue bactrim daily and weekly azithromycin ppx  Neutropenia: Suspected due to flucytosine.  - Monitor.  Diarrhea: Acute, likely from tube feeds.  - Flexiseal - Anti-diarrheals ok  Chronic hepatitis B - Follow up with ID    scd Inpatient pending resolution No family present today For CIR once clears  Consultants:    Multiple  Procedures:    multiple  Antimicrobials:   See notes    Subjective:  Awakens but still confused. Redirectable moving all 4 limbs Needs to be fed Follow command  Objective: Vitals:   06/13/17 0347 06/13/17 0438 06/13/17 0723 06/13/17 1157  BP: 98/61  100/61 119/70  Pulse: 81  76 (!) 102  Resp: 13  18 14   Temp: 97.4 F (36.3 C)  97.6 F (36.4 C) 98.1 F (36.7 C)  TempSrc: Axillary  Axillary Axillary  SpO2: 97%  100% 99%  Weight:  69.1 kg (152 lb 5.4 oz)  Height:        Intake/Output Summary (Last 24 hours) at 06/13/17 1406 Last data filed at 06/13/17 1200  Gross per 24 hour  Intake          3279.75 ml  Output             5550 ml  Net         -2270.25 ml   Filed Weights   06/11/17 0408 06/12/17 0338 06/13/17 0438  Weight: 65.6 kg (144 lb 10 oz) 65.4 kg (144 lb 2.9 oz) 69.1 kg (152 lb  5.4 oz)    Examination:   PERRLA, no ict no perrla Post op changes to R hemicranium NG tube in place S1-S2 no murmur rub or gallop Chest clinically clear no added sound Abdomen soft nontender nondistended no rebound Moving upper limbs to command.  Reflexes not tested    Data Reviewed: I have personally reviewed following labs and imaging studies  CBC:  Recent Labs Lab 06/09/17 0323 06/10/17 0459 06/11/17 0446 06/12/17 0352 06/13/17 0306  WBC 2.7* 2.6* 2.3* 2.6* 2.7*  NEUTROABS 1.6* 1.6* 1.6* 1.6* 1.5*  HGB 10.5* 10.5* 10.1* 10.1* 9.5*  HCT 31.5* 31.9* 30.4* 30.4* 29.0*  MCV 89.7 89.4 89.9 88.4 89.2  PLT 241 253 254 246 258   Basic Metabolic Panel:  Recent Labs Lab 06/09/17 0323 06/09/17 0500 06/10/17 0459 06/11/17 0446 06/12/17 0352 06/13/17 0306  NA  --  134* 136 134* 134* 135  K  --  4.3 4.0 3.8 3.9 4.3  CL  --  105 106 104 104 106  CO2  --  24 25 24 23 23   GLUCOSE  --  89 91 96 103* 91  BUN  --  10 8 11 12 7   CREATININE  --  0.65 0.64 0.63 0.64 0.81  CALCIUM  --  8.8* 9.2 8.8* 9.0 8.6*  MG 2.0  --  1.3* 1.6* 1.7 1.8  PHOS  --  5.9* 5.5* 5.8* 6.1* 6.5*   GFR: Estimated Creatinine Clearance: 135.1 mL/min (by C-G formula based on SCr of 0.81 mg/dL). Liver Function Tests:  Recent Labs Lab 06/10/17 0459 06/11/17 0446 06/12/17 0352 06/13/17 0306 06/13/17 1030  AST  --   --   --   --  65*  ALT  --   --   --   --  69*  ALKPHOS  --   --   --   --  134*  BILITOT  --   --   --   --  0.2*  PROT  --   --   --   --  7.4  ALBUMIN 2.6* 2.7* 2.7* 2.5* 2.5*   No results for input(s): LIPASE, AMYLASE in the last 168 hours. No results for input(s): AMMONIA in the last 168 hours. Coagulation Profile: No results for input(s): INR, PROTIME in the last 168 hours. Cardiac Enzymes: No results for input(s): CKTOTAL, CKMB, CKMBINDEX, TROPONINI in the last 168 hours. BNP (last 3 results) No results for input(s): PROBNP in the last 8760 hours. HbA1C: No results  for input(s): HGBA1C in the last 72 hours. CBG:  Recent Labs Lab 06/12/17 2016 06/12/17 2332 06/13/17 0350 06/13/17 0730 06/13/17 1154  GLUCAP 117* 79 103* 66 86   Lipid Profile: No results for input(s): CHOL, HDL, LDLCALC, TRIG, CHOLHDL, LDLDIRECT in the last 72 hours. Thyroid Function Tests: No results for input(s): TSH, T4TOTAL, FREET4, T3FREE, THYROIDAB in the last 72 hours. Anemia Panel: No results for input(s): VITAMINB12,  FOLATE, FERRITIN, TIBC, IRON, RETICCTPCT in the last 72 hours. Urine analysis:    Component Value Date/Time   COLORURINE YELLOW 05/20/2017 1742   APPEARANCEUR CLEAR 05/20/2017 1742   LABSPEC 1.020 05/20/2017 1742   PHURINE 7.0 05/20/2017 1742   GLUCOSEU NEGATIVE 05/20/2017 1742   HGBUR NEGATIVE 05/20/2017 1742   BILIRUBINUR NEGATIVE 05/20/2017 1742   KETONESUR NEGATIVE 05/20/2017 1742   PROTEINUR NEGATIVE 05/20/2017 1742   UROBILINOGEN 4.0 (H) 09/09/2014 1055   NITRITE NEGATIVE 05/20/2017 1742   LEUKOCYTESUR NEGATIVE 05/20/2017 1742   Sepsis Labs: @LABRCNTIP (procalcitonin:4,lacticidven:4)  ) Recent Results (from the past 240 hour(s))  CSF culture     Status: None   Collection Time: 06/05/17  9:39 AM  Result Value Ref Range Status   Specimen Description CSF  Final   Special Requests Immunocompromised  Final   Gram Stain   Final    WBC PRESENT, PREDOMINANTLY MONONUCLEAR BUDDING YEAST SEEN CRITICAL RESULT CALLED TO, READ BACK BY AND VERIFIED WITH: Buckman RN 10:20 06/05/17 ([email or web address])    Culture RARE CRYPTOCOCCUS NEOFORMANS  Final   Report Status 06/12/2017 FINAL  Final  Fungus Culture With Stain     Status: None (Preliminary result)   Collection Time: 06/05/17  9:40 AM  Result Value Ref Range Status   Fungus Stain Final report  Final    Comment: (NOTE) Performed At: [facility] [address] [place], [name] [id number] Mila Homer MD WU:[id number]    Fungus (Mycology) Culture PENDING  Incomplete   Fungal Source CSF  Final   Fungus Culture Result     Status: None   Collection Time: 06/05/17  9:40 AM  Result Value Ref Range Status   Result 1 Yeast observed  Final    Comment: (NOTE) POSITIVE SMEAR REPORTED TO AMANDA L. AT 1101 ON 06/07/17 BY AM. Performed At: [facility] [address] [place], [name] [id number] Mila Homer MD VH:[id number]   CSF culture with Stat gram stain     Status: None (Preliminary result)   Collection Time: 06/08/17  1:28 PM  Result Value Ref Range Status   Specimen Description CSF  Final   Special Requests Immunocompromised TUBE 2  Final   Gram Stain   Final    WBC PRESENT, PREDOMINANTLY MONONUCLEAR CYTOSPIN SMEAR CORRECTED RESULTS BUDDING YEAST SEEN PREVIOUSLY REPORTED AS: NO ORGANISMS SEEN CORRECTED RESULTS CALLED TO: RN Patsey Berthold [id number] [phone] MLM    Culture NO GROWTH 5 DAYS  Final   Report Status PENDING  Incomplete  Culture, fungus without smear     Status: None (Preliminary result)   Collection Time: 06/08/17  1:28 PM  Result Value Ref Range Status   Specimen Description CSF  Final   Special Requests Immunocompromised TUBE 2  Final   Culture NO FUNGUS ISOLATED AFTER 3 DAYS  Final   Report Status PENDING  Incomplete     Radiology Studies: Dg Chest Port 1 View  Result Date: 06/13/2017 CLINICAL DATA:  History of HIV, cryptococcal meningitis, current smoker. EXAM: PORTABLE CHEST 1 VIEW COMPARISON:  Chest x-ray of June 06, 2017 FINDINGS: The lungs are adequately inflated and clear. The heart and pulmonary vascularity are normal. The feeding tube tip projects below the inferior margin of the image. The right sided ventriculoperitoneal shunt tube appears stable. The left-sided PICC line tip projects over the right cavoatrial junction. Three surgical skin staples are present just above the medial right clavicle. IMPRESSION: There is no active cardiopulmonary disease. The support devices are in reasonable position  where visualized. Electronically Signed   By: David   [place] M.D.   On: 06/13/2017 09:01   Scheduled Meds: . acetaminophen  650 mg Oral Q24H  . azithromycin  1,200 mg Per Tube Weekly  . chlorhexidine  15 mL Mouth Rinse BID  . Chlorhexidine Gluconate Cloth  6 each Topical Daily  . diphenhydrAMINE  25 mg Oral Daily  . flucytosine  1,500 mg Per Tube Q6H  . insulin aspart  2-6 Units Subcutaneous Q4H  . lidocaine (PF)  10 mL Intradermal Once  . mouth rinse  15 mL Mouth Rinse q12n4p  . metoprolol tartrate  5 mg Intravenous Q8H  . potassium chloride  40 mEq Oral Daily  . sulfamethoxazole-trimethoprim  10 mL Per Tube Daily   Continuous Infusions: . sodium chloride Stopped (06/05/17 0200)  . amphotericin  B  Liposome (AMBISOME) ADULT IV Stopped (06/12/17 2100)  . dextrose 5 % and 0.45% NaCl 50 mL/hr at 06/12/17 2247  . feeding supplement (VITAL AF 1.2 CAL) 1,000 mL (06/13/17 1215)  . levETIRAcetam Stopped (06/13/17 0853)  . magnesium sulfate 1 - 4 g bolus IVPB Stopped (06/13/17 0939)  . sodium chloride 1,000 mL (06/07/17 2200)  . sodium chloride       LOS: 24 days    Time spent: 58    Pleas Koch, MD [facility]   If 7PM-7AM, please contact night-coverage [email or web address] Password [email or web address] 06/13/2017, 2:06 PM

## 2017-06-13 NOTE — Plan of Care (Signed)
Problem: Safety: Goal: Ability to remain free from injury will improve Outcome: Progressing Bilateral ankle restraints have been removed today. Tolerating well.   Problem: Activity: Goal: Risk for activity intolerance will decrease Outcome: Progressing Standing in room with PT/OT. Attempting steps.

## 2017-06-14 LAB — CBC WITH DIFFERENTIAL/PLATELET
Basophils Absolute: 0 10*3/uL (ref 0.0–0.1)
Basophils Relative: 0 %
Eosinophils Absolute: 0.1 10*3/uL (ref 0.0–0.7)
Eosinophils Relative: 3 %
HEMATOCRIT: 29.7 % — AB (ref 39.0–52.0)
HEMOGLOBIN: 9.8 g/dL — AB (ref 13.0–17.0)
LYMPHS ABS: 0.9 10*3/uL (ref 0.7–4.0)
Lymphocytes Relative: 28 %
MCH: 29.1 pg (ref 26.0–34.0)
MCHC: 33 g/dL (ref 30.0–36.0)
MCV: 88.1 fL (ref 78.0–100.0)
MONOS PCT: 10 %
Monocytes Absolute: 0.3 10*3/uL (ref 0.1–1.0)
NEUTROS ABS: 2 10*3/uL (ref 1.7–7.7)
NEUTROS PCT: 59 %
Platelets: 278 10*3/uL (ref 150–400)
RBC: 3.37 MIL/uL — ABNORMAL LOW (ref 4.22–5.81)
RDW: 13.7 % (ref 11.5–15.5)
WBC: 3.4 10*3/uL — ABNORMAL LOW (ref 4.0–10.5)

## 2017-06-14 LAB — COMPREHENSIVE METABOLIC PANEL
ALK PHOS: 151 U/L — AB (ref 38–126)
ALT: 66 U/L — ABNORMAL HIGH (ref 17–63)
ANION GAP: 5 (ref 5–15)
AST: 61 U/L — ABNORMAL HIGH (ref 15–41)
Albumin: 2.8 g/dL — ABNORMAL LOW (ref 3.5–5.0)
BILIRUBIN TOTAL: 0.3 mg/dL (ref 0.3–1.2)
BUN: 6 mg/dL (ref 6–20)
CALCIUM: 8.9 mg/dL (ref 8.9–10.3)
CO2: 24 mmol/L (ref 22–32)
Chloride: 105 mmol/L (ref 101–111)
Creatinine, Ser: 0.75 mg/dL (ref 0.61–1.24)
GFR calc Af Amer: >60 mL/min (ref >60)
GFR calc non Af Amer: >60 mL/min (ref >60)
GLUCOSE: 99 mg/dL (ref 65–99)
Potassium: 4.3 mmol/L (ref 3.5–5.1)
Sodium: 134 mmol/L — ABNORMAL LOW (ref 135–145)
TOTAL PROTEIN: 8 g/dL (ref 6.5–8.1)

## 2017-06-14 LAB — GLUCOSE, CAPILLARY
GLUCOSE-CAPILLARY: 92 mg/dL (ref 65–99)
Glucose-Capillary: 92 mg/dL (ref 65–99)
Glucose-Capillary: 101 mg/dL — ABNORMAL HIGH (ref 65–99)
Glucose-Capillary: 90 mg/dL (ref 65–99)
Glucose-Capillary: 100 mg/dL — ABNORMAL HIGH (ref 65–99)
Glucose-Capillary: 129 mg/dL — ABNORMAL HIGH (ref 65–99)
Glucose-Capillary: 90 mg/dL (ref 65–99)

## 2017-06-14 LAB — MAGNESIUM: Magnesium: 1.8 mg/dL (ref 1.7–2.4)

## 2017-06-14 MED ORDER — MAGNESIUM SULFATE 2 GM/50ML IV SOLN
2.0000 g | Freq: Four times a day (QID) | INTRAVENOUS | Status: AC
  Administered 2017-06-14 (×2): 2 g via INTRAVENOUS
  Filled 2017-06-14 (×2): qty 50

## 2017-06-14 MED ORDER — TAMSULOSIN HCL 0.4 MG PO CAPS
0.4000 mg | ORAL_CAPSULE | Freq: Every day | ORAL | Status: DC
  Administered 2017-06-14 – 2017-06-24 (×11): 0.4 mg via ORAL
  Filled 2017-06-14 (×11): qty 1

## 2017-06-14 NOTE — Progress Notes (Signed)
PROGRESS NOTE    Justin Ball  ZOX:[id number] DOB: 08/18/1991 DOA: 05/20/2017 PCP: Randall HissVan Dam, [name], MD  Outpatient Specialists:     Brief Narrative:  26 y/o ? HIV/AIDS diag 2013-high risk sexual behaviour  Documented virological failure on Tivicay/Truvada  anal intraepithelial neoplasia III s/p surgery Dr. [name] 12/28/14  Rectal abscesses/submandibular abscesses Syphyllis Bipolar affecting care documented extensive medical noncompliance  Admit with unresponsiveness 5/28 initally found to have crytpo in CSF, Strep in blood  2/2 ICH and blown pupil, NS placed ventriculostomy for elevated ICP Extubated 05/26/17    Assessment & Plan:   Principal Problem:   Seizure (HCC) Active Problems:   HIV (human immunodeficiency virus infection) (HCC)   Rash and nonspecific skin eruption   Insomnia   Bipolar disorder (HCC)   Intersphincteric abscess   AIDS (HCC)   Headache   Meningoencephalitis   Respiratory failure (HCC)   Encephalopathy acute   Patient has nasogastric tube   History of syphilis   Tobacco abuse   Tachypnea   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 [name] 6/19 - S/p ventriculostomy 5/28-6/8.  - Continue amphotericin and flucytosine per ID - Continue linezolid as per ID until 06/14/2017 - Continue keppra per neurology for seizure Tx - EVD removed 6/8  - LP repeated 6/12 with opening pressure 27mmHg - LP repeated 6/15 with opening pressure 19mmHg Patient has made significant improvements in mentation, coordination, cognition Speech therapy has recommended regular diet thin liquids and he tolerated a hamburger and 2 drinks a day at the bedside and was able to eat by himself  Dysphagia secondary to acute metabolic encephalopathy  Much better  Appreciate speech therapy  cognitive eval   Discontinue Cotrack feeds  We will supplement with oral shakes and other  recommendations as per nutritionist  Acute encephalopathy:   meningoencephalitis, seizures, ischemic infarcts (no antiplatelet per neuro), metabolic derangements and  medications; likely complicated by delirium.   Numerous scattered areas of ischemia on MRI   Ativan prn agitation, off precedex since 6/8, avoid haldol with seizure disorder   Still slight confused   Cut back NOrco to q 8 prn severe pain/discontinued Benadryl as per Amphotericin B protocol 6/20  Streptococcus pneumoniae bacteremia - Linezolid per ID x14 days (6/6 - last dose on 6/21??) - TTE ordered: No vegetations noted   Bordatella pneumonia - Was treated with doxycycline per ID, now off   AIDS: CD4 count April 2018 was 2.  - Hold ART per ID - Continue bactrim daily and weekly azithromycin ppx  Neutropenia: Suspected due to flucytosine.  - Monitor.  Diarrhea: Acute, likely from tube feeds.  - Flexiseal - Anti-diarrheals ok  Chronic hepatitis B - Follow up with ID    scd Inpatient pending resolution No family present today Patient's plan has been updated to include skilled nursing care facility vs CIR  Consultants:    Multiple  Procedures:    multiple  Antimicrobials:   See notes    Subjective:  much more awake and alert doing fair Ate on his own No pains Spontaneous movement and foll commands  Objective: Vitals:   06/13/17 2316 06/14/17 0410 06/14/17 0723 06/14/17 1636  BP: 127/80 113/61  (!) 101/57  Pulse: (!) 115 (!) 101  (!) 101  Resp: 16 14  20   Temp: 97.9 F (36.6 C) 97.8 F (36.6 C) 97.5 F (36.4 C) 98.6 F (37 C)  TempSrc: Axillary Axillary Axillary Oral  SpO2: 100%  99% 99% 96%  Weight:  66.8 kg (147 lb 4.3 oz)    Height:        Intake/Output Summary (Last 24 hours) at 06/14/17 1652 Last data filed at 06/14/17 1300  Gross per 24 hour  Intake             2850 ml  Output             5125 ml  Net            -2275 ml   Filed Weights   06/12/17 0338 06/13/17  0438 06/14/17 0410  Weight: 65.4 kg (144 lb 2.9 oz) 69.1 kg (152 lb 5.4 oz) 66.8 kg (147 lb 4.3 oz)    Examination:   PERRLA, no ict no perrla Post op changes stable, out of restraints NG tube in place S1-S2 no murmur cta B  Data Reviewed: I have personally reviewed following labs and imaging studies  CBC:  Recent Labs Lab 06/10/17 0459 06/11/17 0446 06/12/17 0352 06/13/17 0306 06/14/17 0345  WBC 2.6* 2.3* 2.6* 2.7* 3.4*  NEUTROABS 1.6* 1.6* 1.6* 1.5* 2.0  HGB 10.5* 10.1* 10.1* 9.5* 9.8*  HCT 31.9* 30.4* 30.4* 29.0* 29.7*  MCV 89.4 89.9 88.4 89.2 88.1  PLT 253 254 246 258 278   Basic Metabolic Panel:  Recent Labs Lab 06/09/17 0500 06/10/17 0459 06/11/17 0446 06/12/17 0352 06/13/17 0306 06/14/17 0345  NA 134* 136 134* 134* 135 134*  K 4.3 4.0 3.8 3.9 4.3 4.3  CL 105 106 104 104 106 105  CO2 24 25 24 23 23 24   GLUCOSE 89 91 96 103* 91 99  BUN 10 8 11 12 7 6   CREATININE 0.65 0.64 0.63 0.64 0.81 0.75  CALCIUM 8.8* 9.2 8.8* 9.0 8.6* 8.9  MG  --  1.3* 1.6* 1.7 1.8 1.8  PHOS 5.9* 5.5* 5.8* 6.1* 6.5*  --    GFR: Estimated Creatinine Clearance: 132.2 mL/min (by C-G formula based on SCr of 0.75 mg/dL). Liver Function Tests:  Recent Labs Lab 06/11/17 0446 06/12/17 0352 06/13/17 0306 06/13/17 1030 06/14/17 0345  AST  --   --   --  65* 61*  ALT  --   --   --  69* 66*  ALKPHOS  --   --   --  134* 151*  BILITOT  --   --   --  0.2* 0.3  PROT  --   --   --  7.4 8.0  ALBUMIN 2.7* 2.7* 2.5* 2.5* 2.8*   No results for input(s): LIPASE, AMYLASE in the last 168 hours. No results for input(s): AMMONIA in the last 168 hours. Coagulation Profile: No results for input(s): INR, PROTIME in the last 168 hours. Cardiac Enzymes: No results for input(s): CKTOTAL, CKMB, CKMBINDEX, TROPONINI in the last 168 hours. BNP (last 3 results) No results for input(s): PROBNP in the last 8760 hours. HbA1C: No results for input(s): HGBA1C in the last 72 hours. CBG:  Recent  Labs Lab 06/13/17 2313 06/14/17 0307 06/14/17 0721 06/14/17 1209 06/14/17 1631  GLUCAP 92 101* 92 90 100*   Lipid Profile: No results for input(s): CHOL, HDL, LDLCALC, TRIG, CHOLHDL, LDLDIRECT in the last 72 hours. Thyroid Function Tests: No results for input(s): TSH, T4TOTAL, FREET4, T3FREE, THYROIDAB in the last 72 hours. Anemia Panel: No results for input(s): VITAMINB12, FOLATE, FERRITIN, TIBC, IRON, RETICCTPCT in the last 72 hours. Urine analysis:    Component Value Date/Time   COLORURINE YELLOW 05/20/2017 1742   APPEARANCEUR CLEAR  05/20/2017 1742   LABSPEC 1.020 05/20/2017 1742   PHURINE 7.0 05/20/2017 1742   GLUCOSEU NEGATIVE 05/20/2017 1742   HGBUR NEGATIVE 05/20/2017 1742   BILIRUBINUR NEGATIVE 05/20/2017 1742   KETONESUR NEGATIVE 05/20/2017 1742   PROTEINUR NEGATIVE 05/20/2017 1742   UROBILINOGEN 4.0 (H) 09/09/2014 1055   NITRITE NEGATIVE 05/20/2017 1742   LEUKOCYTESUR NEGATIVE 05/20/2017 1742   Sepsis Labs: @LABRCNTIP (procalcitonin:4,lacticidven:4)  ) Recent Results (from the past 240 hour(s))  CSF culture     Status: None   Collection Time: 06/05/17  9:39 AM  Result Value Ref Range Status   Specimen Description CSF  Final   Special Requests Immunocompromised  Final   Gram Stain   Final    WBC PRESENT, PREDOMINANTLY MONONUCLEAR BUDDING YEAST SEEN CRITICAL RESULT CALLED TO, READ BACK BY AND VERIFIED WITH: Buckman RN 10:20 06/05/17 ([email or web address])    Culture RARE CRYPTOCOCCUS NEOFORMANS  Final   Report Status 06/12/2017 FINAL  Final  Fungus Culture With Stain     Status: None (Preliminary result)   Collection Time: 06/05/17  9:40 AM  Result Value Ref Range Status   Fungus Stain Final report  Final    Comment: (NOTE) Performed At: [facility] [address] [place], [name] [id number] Mila Homer MD JX:[id number]    Fungus (Mycology) Culture PENDING  Incomplete   Fungal Source CSF  Final  Fungus Culture Result     Status: None   Collection  Time: 06/05/17  9:40 AM  Result Value Ref Range Status   Result 1 Yeast observed  Final    Comment: (NOTE) POSITIVE SMEAR REPORTED TO AMANDA L. AT 1101 ON 06/07/17 BY AM. Performed At: [facility] [address] [place], [name] [id number] Mila Homer MD ON:[id number]   CSF culture with Stat gram stain     Status: None (Preliminary result)   Collection Time: 06/08/17  1:28 PM  Result Value Ref Range Status   Specimen Description CSF  Final   Special Requests Immunocompromised TUBE 2  Final   Gram Stain   Final    WBC PRESENT, PREDOMINANTLY MONONUCLEAR CYTOSPIN SMEAR CORRECTED RESULTS BUDDING YEAST SEEN PREVIOUSLY REPORTED AS: NO ORGANISMS SEEN CORRECTED RESULTS CALLED TO: RN Patsey Berthold [phone] MLM    Culture NO GROWTH 6 DAYS  Final   Report Status PENDING  Incomplete  Culture, fungus without smear     Status: None (Preliminary result)   Collection Time: 06/08/17  1:28 PM  Result Value Ref Range Status   Specimen Description CSF  Final   Special Requests Immunocompromised TUBE 2  Final   Culture NO FUNGUS ISOLATED AFTER 3 DAYS  Final   Report Status PENDING  Incomplete     Radiology Studies: Dg Chest Port 1 View  Result Date: 06/13/2017 CLINICAL DATA:  History of HIV, cryptococcal meningitis, current smoker. EXAM: PORTABLE CHEST 1 VIEW COMPARISON:  Chest x-ray of June 06, 2017 FINDINGS: The lungs are adequately inflated and clear. The heart and pulmonary vascularity are normal. The feeding tube tip projects below the inferior margin of the image. The right sided ventriculoperitoneal shunt tube appears stable. The left-sided PICC line tip projects over the right cavoatrial junction. Three surgical skin staples are present just above the medial right clavicle. IMPRESSION: There is no active cardiopulmonary disease. The support devices are in reasonable position where visualized. Electronically Signed   By: David  [place] M.D.   On: 06/13/2017 09:01   Scheduled  Meds: . acetaminophen  650 mg Oral Q24H  .  azithromycin  1,200 mg Per Tube Weekly  . chlorhexidine  15 mL Mouth Rinse BID  . Chlorhexidine Gluconate Cloth  6 each Topical Daily  . flucytosine  1,500 mg Per Tube Q6H  . insulin aspart  2-6 Units Subcutaneous Q4H  . lidocaine (PF)  10 mL Intradermal Once  . linezolid  600 mg Oral Q12H  . mouth rinse  15 mL Mouth Rinse q12n4p  . metoprolol tartrate  5 mg Intravenous Q8H  . potassium chloride  40 mEq Oral Daily  . sulfamethoxazole-trimethoprim  10 mL Per Tube Daily   Continuous Infusions: . sodium chloride Stopped (06/05/17 0200)  . amphotericin  B  Liposome (AMBISOME) ADULT IV Stopped (06/13/17 1952)  . dextrose 5 % and 0.45% NaCl 50 mL/hr at 06/14/17 1652  . feeding supplement (VITAL AF 1.2 CAL) Stopped (06/14/17 1638)  . levETIRAcetam Stopped (06/14/17 0953)  . magnesium sulfate 1 - 4 g bolus IVPB Stopped (06/14/17 1322)  . sodium chloride 1,000 mL (06/07/17 2200)  . sodium chloride       LOS: 25 days    Time spent: 65    Pleas Koch, MD [facility]   If 7PM-7AM, please contact night-coverage [email or web address] Password [facility] 06/14/2017, 4:52 PM

## 2017-06-14 NOTE — [place] FL2 (Signed)
[place] MEDICAID FL2 LEVEL OF CARE SCREENING TOOL     IDENTIFICATION  Patient Name: Justin Ball Birthdate: [birth year] Sex: male Admission Date (Current Location): 05/20/2017  [facility] and [place] Number:  Producer, television/film/video and Address:  [facility] [place], [address] [place], [name] [address]      Provider Number: [id number]  Attending Physician Name and Address:  Rhetta Mura, MD  Relative Name and Phone Number:       Current Level of Care: Hospital Recommended Level of Care: Skilled Nursing Facility Prior Approval Number:    Date Approved/Denied:   PASRR Number: Manual review  Discharge Plan: SNF    Current Diagnoses: Patient Active Problem List   Diagnosis Date Noted  . Patient has nasogastric tube   . History of syphilis   . Tobacco abuse   . Tachypnea   . Encephalopathy acute   . Meningoencephalitis   . Respiratory failure (HCC)   . Headache 05/20/2017  . Seizure (HCC) 05/20/2017  . Leukopenia 03/06/2017  . Peri-rectal abscess 03/06/2017  . 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 Ulcer of soft palate 09/12/2016  . Acute hypokalemia 09/12/2016  . Anemia 09/12/2016  . Alcohol use 09/12/2016  . Oral abscess 09/12/2016  . Abscess   . AIDS (HCC) 01/19/2015  . 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 H/O noncompliance with medical treatment - finincial, presenting hazards to health 12/28/2014  . Rectal abscess 12/28/2014  . Intersphincteric abscess 12/28/2014  . Bipolar disorder (HCC) 04/08/2013  . Muscle ache 01/06/2013  . Lower back pain 01/06/2013  . Insomnia 01/06/2013  . HIV (human immunodeficiency virus infection) (HCC) 10/10/2012  . Rash and nonspecific skin eruption 10/10/2012  . Rectal discharge 10/10/2012  . Syphilis 10/10/2012    Orientation RESPIRATION BLADDER Height & Weight     Self, Situation, Place  Normal Indwelling catheter Weight: 147 lb 4.3 oz (66.8 kg) Height:  6\' 1"  (185.4 cm)   BEHAVIORAL SYMPTOMS/MOOD NEUROLOGICAL BOWEL NUTRITION STATUS   (Restless, Anxious, Agitated) Convulsions/Seizures Incontinent (Flexiseal) Diet, NG/panda (Currently has a cortrack but are advancing diet. Has a regular diet.)  AMBULATORY STATUS COMMUNICATION OF NEEDS Skin   Extensive Assist Verbally Skin abrasions, Other (Comment), Surgical wounds, Bruising (Blister, catheter entry/exit. Open/dehisced wound/incisoin: Left elbow (foam).)                       Personal Care Assistance Level of Assistance  Bathing, Feeding, Dressing Bathing Assistance: Maximum assistance Feeding assistance: Maximum assistance Dressing Assistance: Maximum assistance     Functional Limitations Info  Sight, Hearing, Speech Sight Info: Adequate Hearing Info: Adequate Speech Info: Adequate    SPECIAL CARE FACTORS FREQUENCY  PT (By licensed PT), Restraints, OT (By licensed OT), Speech therapy     PT Frequency: 5 x week OT Frequency: 5 x week     Speech Therapy Frequency: 5 x week Restraints Frequency: Wrist restraints, mittens, side rails, sitter: Will have dc'ed for 24 hours prior to discharge    Contractures Contractures Info: Not present    Additional Factors Info  Code Status, Allergies, Psychotropic Code Status Info: Full Allergies Info: NKDA Psychotropic Info: Bipolar Disorder: Ativan 1 mg IV every 4 hour prn         Current Medications (06/14/2017):  This is the current hospital active medication list Current Facility-Administered Medications  Medication Dose Route Frequency Provider Last Rate Last Dose  . 0.9 %  sodium chloride infusion  250 mL  Intravenous PRN Wilber Oliphant, MD   Stopped at 06/05/17 0200  . acetaminophen (TYLENOL) tablet 650 mg  650 mg Oral Q4H PRN Wilber Oliphant, MD   650 mg at 06/12/17 2313  . acetaminophen (TYLENOL) tablet 650 mg  650 mg Oral Q24H [name], Lisette Grinder, MD   650 mg at 06/13/17 1623  . albuterol (PROVENTIL) (2.5 MG/3ML) 0.083% nebulizer solution 2.5  mg  2.5 mg Nebulization Q2H PRN Wilber Oliphant, MD      . amphotericin B liposome (AMBISOME) 250 mg in dextrose 5 % 500 mL IVPB  4 mg/kg Intravenous Q24H Shaune Pollack, MD   Stopped at 06/13/17 1952  . azithromycin (ZITHROMAX) 200 MG/5ML suspension 1,200 mg  1,200 mg Per Tube Weekly Judyann Munson, MD   1,200 mg at 06/14/17 0929  . chlorhexidine (PERIDEX) 0.12 % solution 15 mL  15 mL Mouth Rinse BID Roslynn Amble, MD   15 mL at 06/14/17 0928  . Chlorhexidine Gluconate Cloth 2 % PADS 6 each  6 each Topical Daily Leslye Peer, MD   6 each at 06/14/17 1000  . dextrose 5 %-0.45 % sodium chloride infusion   Intravenous Continuous Rumbarger, Faye Ramsay, RPH 50 mL/hr at 06/13/17 1900    . feeding supplement (VITAL AF 1.2 CAL) liquid 1,000 mL  1,000 mL Per Tube Continuous Roslynn Amble, MD 75 mL/hr at 06/14/17 0637 1,000 mL at 06/14/17 0637  . flucytosine (ANCOBON) capsule 1,500 mg  1,500 mg Per Tube Q6H Coralyn Helling, MD   1,500 mg at 06/14/17 1337  . HYDROcodone-acetaminophen (NORCO/VICODIN) 5-325 MG per tablet 1 tablet  1 tablet Oral Q8H PRN Rhetta Mura, MD   1 tablet at 06/14/17 0334  . insulin aspart (novoLOG) injection 2-6 Units  2-6 Units Subcutaneous Q4H Rhetta Mura, MD   2 Units at 06/12/17 1836  . levETIRAcetam (KEPPRA) 1,000 mg in sodium chloride 0.9 % 100 mL IVPB  1,000 mg Intravenous Q12H Rejeana Brock, MD   Stopped at 06/14/17 [phone]  . lidocaine (PF) (XYLOCAINE) 1 % injection 10 mL  10 mL Intradermal Once Caryl Pina, MD      . linezolid (ZYVOX) tablet 600 mg  600 mg Oral Q12H Rivet, Carly J, MD   600 mg at 06/14/17 0929  . LORazepam (ATIVAN) injection 1 mg  1 mg Intravenous Q4H PRN Leslye Peer, MD   1 mg at 06/14/17 0334  . magnesium sulfate IVPB 2 g 50 mL  2 g Intravenous Q6H Ancil Boozer, RPH   Stopped at 06/14/17 1322  . MEDLINE mouth rinse  15 mL Mouth Rinse q12n4p Roslynn Amble, MD   15 mL at 06/14/17 1225  . metoprolol tartrate  (LOPRESSOR) injection 2.5-5 mg  2.5-5 mg Intravenous Q3H PRN Kalman Shan, MD   5 mg at 05/31/17 1222  . metoprolol tartrate (LOPRESSOR) injection 5 mg  5 mg Intravenous Q8H Deterding, Dorise Hiss, MD   5 mg at 06/14/17 1337  . ondansetron (ZOFRAN) injection 4 mg  4 mg Intravenous Q6H PRN Wilber Oliphant, MD      . potassium chloride 20 MEQ/15ML (10%) solution 40 mEq  40 mEq Oral Daily Rumbarger, Faye Ramsay, RPH   40 mEq at 06/14/17 0929  . sodium chloride 0.9 % bolus 1,000 mL  1,000 mL Intravenous Q24H Blanchard Kelch, NP 1,000 mL/hr at 06/07/17 2200 1,000 mL at 06/13/17 1930  . sodium chloride 0.9 % bolus 1,000 mL  1,000 mL Intravenous Q24H Blanchard Kelch,  NP   1,000 mL at 06/13/17 1623  . sodium chloride flush (NS) 0.9 % injection 10-40 mL  10-40 mL Intracatheter PRN Leslye PeerByrum, Robert S, MD      . sulfamethoxazole-trimethoprim (BACTRIM,SEPTRA) 200-40 MG/5ML suspension 10 mL  10 mL Per Tube Daily Coralyn HellingSood, Vineet, MD   10 mL at 06/14/17 [id number]     Discharge Medications: Please see discharge summary for a list of discharge medications.  Relevant Imaging Results:  Relevant Lab Results:   Additional Information [employer] expires on 06/18/2017. Grandmother states she signed paperwork for Medicaid/Disability today 06/14/2017.  Margarito LinerSarah C Raynell Upton, LCSW

## 2017-06-14 NOTE — Progress Notes (Signed)
Occupational Therapy Treatment Patient Details Name: Justin Ball MRN: [id number] DOB: 06-13-1991 Today's Date: 06/14/2017    History of present illness Pt is a 26 year old man admitted s/p seizure activity having several days of recent headaches. 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 EVD was placed after acute decompensation on 5/28 with decorticate posturing and asymmetric pupils. 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 PMH includes: syphilis, substance abuse, soft palate ulceration, scabies, neuromuscular disorder, HIV/AIDS, depression, bipolar disorder, anxiety   OT comments  Pt is making excellent progress.  He is now able to sit EOB with min guard assist to min A, stand with min A.  He is still very distractible demonstrating focused to periods of sustained attention.  He requires max - total A for ADLs due to cognitive deficits.  Recommend CIR.   Follow Up Recommendations  Supervision/Assistance - 24 hour    Equipment Recommendations  None recommended by OT    Recommendations for Other Services Rehab consult    Precautions / Restrictions Precautions Precautions: Fall Precaution Comments: flexiseal, foley, NG tube, IV        Mobility Bed Mobility Overal bed mobility: Needs Assistance Bed Mobility: Supine to Sit;Sit to Supine     Supine to sit: Mod assist Sit to supine: Mod assist   General bed mobility comments: Pt requires assist to initiate task and to move LEs onto and off of bed.  He initially resisted lifting trunk, but then spontaneously lifted himself into seated postion on EOB   Transfers Overall transfer level: Needs assistance Equipment used: 1 person hand held assist Transfers: Sit to/from Stand Sit to Stand: Min assist         General transfer comment: Min A to initiate task and to move into standing.  Min A to steady.  Mod A to sidestep up EOB due to difficulty weight  shifting     Balance Overall balance assessment: Needs assistance Sitting-balance support: Feet supported Sitting balance-Leahy Scale: Fair Sitting balance - Comments: Pt initially requiring min A for EOB sitting balance due to distractabilitly and restlessness, but he progressed to min guard assist with EOB sitting    Standing balance support: Single extremity supported Standing balance-Leahy Scale: Poor Standing balance comment: Pt able to stand with minA for brief periods before spontaneously sitting                            ADL either performed or assessed with clinical judgement   ADL Overall ADL's : Needs assistance/impaired                         Toilet Transfer: Moderate assistance;BSC           Functional mobility during ADLs: Moderate assistance       Vision   Additional Comments: Pt able to read large print on therapist's nametag    Perception     Praxis      Cognition Arousal/Alertness: Awake/alert Behavior During Therapy: Flat affect Overall Cognitive Status: Impaired/Different from baseline Area of Impairment: Orientation;Attention;Memory;Following commands;Safety/judgement;Awareness;Problem solving                 Orientation Level: Disoriented to;Time;Situation;Place (variable ) Current Attention Level: Sustained;Focused Memory: Decreased short-term memory Following Commands: Follows one step commands consistently;Follows one step commands inconsistently Safety/Judgement: Decreased awareness of safety;Decreased awareness of deficits Awareness: Intellectual Problem Solving: Slow processing;Decreased initiation;Difficulty sequencing;Requires verbal  cues;Requires tactile cues General Comments: Pt unable to recall staff's names after 2-5 min. delay.  After several repetitions, he was able to recall RN's name with prompting         Exercises     Shoulder Instructions       General Comments HR to 126    Pertinent  Vitals/ Pain       Pain Assessment: Faces Faces Pain Scale: Hurts little more Pain Location: multiple locations  Pain Descriptors / Indicators: Grimacing;Moaning Pain Intervention(s): Monitored during session;Repositioned  Home Living                                          Prior Functioning/Environment              Frequency  Min 3X/week        Progress Toward Goals  OT Goals(current goals can now be found in the care plan section)  Progress towards OT goals: Progressing toward goals     Plan Discharge plan remains appropriate    Co-evaluation                 AM-PAC PT "6 Clicks" Daily Activity     Outcome Measure   Help from another person eating meals?: A Lot Help from another person taking care of personal grooming?: A Lot Help from another person toileting, which includes using toliet, bedpan, or urinal?: Total Help from another person bathing (including washing, rinsing, drying)?: Total Help from another person to put on and taking off regular upper body clothing?: Total Help from another person to put on and taking off regular lower body clothing?: Total 6 Click Score: 8    End of Session    OT Visit Diagnosis: Unsteadiness on feet (R26.81);Other abnormalities of gait and mobility (R26.89);Muscle weakness (generalized) (M62.81);Feeding difficulties (R63.3);Other symptoms and signs involving the nervous system (R29.898);Other symptoms and signs involving cognitive function;Cognitive communication deficit (R41.841)   Activity Tolerance Patient tolerated treatment well   Patient Left in bed;with call bell/phone within reach;with nursing/sitter in room;with restraints reapplied   Nurse Communication Mobility status        Time: 1102-1140 OT Time Calculation (min): 38 min  Charges: OT General Charges $OT Visit: 1 Procedure OT Treatments $Therapeutic Activity: 38-52 mins  Reynolds AmericanWendi Espyn Ball, OTR/L [phone]    Justin Ball, Justin Ball  M 06/14/2017, 1:05 PM

## 2017-06-14 NOTE — Progress Notes (Signed)
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 Also with CoNS in CSF cx, on linezolid. Pharmacy consulted by ID to manage electrolyte replacement.  K 4.3, Mg 1.8, SCr 0.75, stable. Remains on potassium 40 mEq daily.  Plan: - Give Mg 2g IV x2 doses today - Continue to monitor electrolytes  Casilda Carlsaylor Basim Bartnik, PharmD, BCPS PGY-2 Infectious Diseases Pharmacy Resident Pager: [phone] 06/14/2017, 11:08 AM

## 2017-06-14 NOTE — Clinical Social Work Note (Signed)
Clinical Social Work Assessment  Patient Details  Name: Justin Ball MRN: [id number] Date of Birth: July 04, 1991  Date of referral:  06/14/17               Reason for consult:  Facility Placement, Discharge Planning                Permission sought to share information with:  Facility Sport and exercise psychologist, Family Supports Permission granted to share information::     Name::     Orbie Pyo  Agency::  SNF's  Relationship::  Grandmother  Contact Information:  [phone]  Housing/Transportation Living arrangements for the past 2 months:  [place] of Information:  Medical Team, Other (Comment Required) Magazine features editor) Patient Interpreter Needed:  None Criminal Activity/Legal Involvement Pertinent to Current Situation/Hospitalization:  No - Comment as needed Significant Relationships:  Parents, Other(Comment) Magazine features editor) Lives with:  Other (Comment) (Grandmother) Do you feel safe going back to the place where you live?  Yes Need for family participation in patient care:  Yes (Comment)  Care giving concerns:  PT recommending CIR once medically stable for discharge. CSW initiating SNF backup plan.   Social Worker assessment / plan:  CSW met with patient. Grandmother and sitter at bedside. Patient not fully oriented. CSW introduced role and explained that PT recommendations would be discussed. CIR is first preference but grandmother is agreeable to CSW starting the SNF backup referral process. Patient's grandmother confirmed that his insurance expires on 6/25 but stated that she completed a Medicaid application with financial counselor this morning. Patient's grandmother is not familiar with the SNF's in the area so CSW provided list for her to review. CSW explained that with lack of insurance or Medicaid pending, it will limit the patient's options. Patient's grandmother expressed understanding. The patient's grandmother plans for him to return home with her when he has  finished rehab. She stated that she will likely need help at home from 8:00 am-1:00 pm 5-7 days per week. The patient's mother can help her each day starting at 6:00 pm. The patient's mother stated that she has been cleaning and replacing things in his room in preparation for his return home. She discussed how his immune system is lowered. Patient's PASARR is pending. Patient still in restraints with a sitter and has a cortrack although they are starting a regular diet today. No further concerns. CSW encouraged patient's grandmother to contact CSW as needed. CSW will continue to follow patient for support and facilitate discharge to SNF, if needed, once medically stable for discharge.  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 Patient's grandmother prefers CIR but is agreeable to SNF backup plan if needed. Patient's grandmother and mother supportive and involved in patient's care. Patient's grandmother appreciated social work intervention.  Patient/Family's Understanding of and Emotional Response to Diagnosis, Current Treatment, and Prognosis:  Patient not fully oriented. Patient's grandmother has a good understanding of the reason for admission and his need for rehab prior to returning home. CSW discussed long and short-term goals with grandmother. Patient's grandmother appears happy with hospital care.  Emotional Assessment Appearance:  Appears stated age Attitude/Demeanor/Rapport:  Unable to Assess Affect (typically observed):  Unable to Assess Orientation:  Oriented to Self, Oriented to Place, Oriented to Situation Alcohol / Substance use:  Tobacco Use, Alcohol Use Psych  involvement (Current and /or in the community):  No (Comment)  Discharge Needs   Concerns to be addressed:  Care Coordination Readmission within the last 30 days:  No Current discharge risk:  Cognitively Impaired, Dependent with Mobility Barriers to Discharge:  [place] Tour manager), Insurance Authorization, Inadequate or no insurance, Continued Medical Work up, Requiring sitter/restraints, Other ([place])   Candie Chroman, LCSW 06/14/2017, 3:18 PM

## 2017-06-14 NOTE — Progress Notes (Signed)
Removed foley at 1500 and attempted condom catheter, pt. due to void. Bladder scan >1000, received order for foley replacement.

## 2017-06-14 NOTE — Progress Notes (Signed)
  Speech Language Pathology Treatment: Dysphagia;Cognitive-Linquistic  Patient Details Name: Justin Ball MRN: [id number] DOB: 01/20/1991 Today's Date: 06/14/2017 Time: [id number] SLP Time Calculation (min) (ACUTE ONLY): 27 min  Assessment / Plan / Recommendation Clinical Impression  Pt demonstrates significant improvement in arousal and focused/sustained attention. He is able to follow one step commands in 75% of trials, respond verbally to most questions, cueing needed at times if pt is distracted by environment or confusion. Pt sustains attention for several second but is easily distracted by environment and can still be restless at times. As attention improves, so does memory and pt is able to correctly respond correctly to 6/10 basic orientation/environment questions, recalling some recent teaching from staff.  Pt is able to consume thin liquids and masticate solids without signs of aspiration. Mastication is still slow with a lack of labial closure or rotary mandibular motion, but functional. Recommend a regular texture diet to allow for the most palatable foods that may tempt pt to eat. Given that he will have supervision for lunch, allowed him to order a cheeseburger and fries. Will f/u for tolerance and modification as needed.   HPI HPI: Pt is a 26 year old man admitted s/p seizure activity having several days of recent headaches. 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 EVD was placed after acute decompensation on 5/28 with decorticate posturing and asymmetric pupils. 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 PMH includes: syphilis, substance abuse, soft palate ulceration, scabies, neuromuscular disorder, HIV/AIDS, depression, bipolar disorder, anxiety      SLP Plan  Continue with current plan of care       Recommendations  Diet recommendations: Regular;Thin liquid Liquids provided via: Straw Medication  Administration: Whole meds with liquid Supervision: Staff to assist with self feeding;Full supervision/cueing for compensatory strategies Compensations: Minimize environmental distractions;Slow rate;Small sips/bites Postural Changes and/or Swallow Maneuvers: Seated upright 90 degrees;Upright 30-60 min after meal                Oral Care Recommendations: Oral care BID Follow up Recommendations: Inpatient Rehab SLP Visit Diagnosis: Dysphagia, unspecified (R13.10) Plan: Continue with current plan of care       GO               [facility] Hospital Of Worcester IncBonnie Nicolemarie Wooley, MA CCC-SLP [phone]  Claudine MoutonDeBlois, Korynne Dols Caroline 06/14/2017, 1:51 PM

## 2017-06-14 NOTE — Clinical Social Work Placement (Signed)
   CLINICAL SOCIAL WORK PLACEMENT  NOTE  Date:  06/14/2017  Patient Details  Name: Justin Ball MRN: [id number] Date of Birth: 03/30/1991  Clinical Social Work is seeking post-discharge placement for this patient at the Skilled  Nursing Facility level of care (*CSW will initial, date and re-position this form in  chart as items are completed):  Yes   Patient/family provided with [facility] Clinical Social Work Department's list of facilities offering this level of care within the geographic area requested by the patient (or if unable, by the patient's family).  Yes   Patient/family informed of their freedom to choose among providers that offer the needed level of care, that participate in Medicare, Medicaid or managed care program needed by the patient, have an available bed and are willing to accept the patient.  Yes   Patient/family informed of [facility] ownership interest [facility] and [facility], as well as of the fact that they are under no obligation to receive care at these facilities.  PASRR submitted to EDS on 06/13/17     PASRR number received on       Existing PASRR number confirmed on       FL2 transmitted to all facilities in geographic area requested by pt/family on       FL2 transmitted to all facilities within larger geographic area on       Patient informed that his/her managed care company has contracts with or will negotiate with certain facilities, including the following:            Patient/family informed of bed offers received.  Patient chooses bed at       Physician recommends and patient chooses bed at      Patient to be transferred to   on  .  Patient to be transferred to facility by       Patient family notified on   of transfer.  Name of family member notified:        PHYSICIAN       Additional Comment:    _______________________________________________ Margarito LinerSarah C Gerhard Rappaport, LCSW 06/14/2017, 3:24 PM

## 2017-06-15 DIAGNOSIS — G003 Staphylococcal meningitis: Secondary | ICD-10-CM

## 2017-06-15 DIAGNOSIS — F99 Mental disorder, not otherwise specified: Secondary | ICD-10-CM

## 2017-06-15 LAB — RENAL FUNCTION PANEL
ANION GAP: 7 (ref 5–15)
Albumin: 2.6 g/dL — ABNORMAL LOW (ref 3.5–5.0)
BUN: 7 mg/dL (ref 6–20)
CHLORIDE: 107 mmol/L (ref 101–111)
CO2: 25 mmol/L (ref 22–32)
Calcium: 9 mg/dL (ref 8.9–10.3)
Creatinine, Ser: 0.69 mg/dL (ref 0.61–1.24)
Glucose, Bld: 80 mg/dL (ref 65–99)
POTASSIUM: 3.9 mmol/L (ref 3.5–5.1)
Phosphorus: 6.2 mg/dL — ABNORMAL HIGH (ref 2.5–4.6)
Sodium: 139 mmol/L (ref 135–145)

## 2017-06-15 LAB — CBC WITH DIFFERENTIAL/PLATELET
BASOS ABS: 0 10*3/uL (ref 0.0–0.1)
Basophils Relative: 1 %
EOS PCT: 4 %
Eosinophils Absolute: 0.1 10*3/uL (ref 0.0–0.7)
HEMATOCRIT: 29.2 % — AB (ref 39.0–52.0)
HEMOGLOBIN: 9.5 g/dL — AB (ref 13.0–17.0)
LYMPHS ABS: 0.5 10*3/uL — AB (ref 0.7–4.0)
LYMPHS PCT: 28 %
MCH: 29.2 pg (ref 26.0–34.0)
MCHC: 32.5 g/dL (ref 30.0–36.0)
MCV: 89.8 fL (ref 78.0–100.0)
Monocytes Absolute: 0.2 10*3/uL (ref 0.1–1.0)
Monocytes Relative: 12 %
NEUTROS ABS: 1 10*3/uL — AB (ref 1.7–7.7)
NEUTROS PCT: 55 %
Platelets: 290 10*3/uL (ref 150–400)
RBC: 3.25 MIL/uL — AB (ref 4.22–5.81)
RDW: 14 % (ref 11.5–15.5)
WBC: 1.9 10*3/uL — AB (ref 4.0–10.5)

## 2017-06-15 LAB — GLUCOSE, CAPILLARY
GLUCOSE-CAPILLARY: 97 mg/dL (ref 65–99)
GLUCOSE-CAPILLARY: 91 mg/dL (ref 65–99)
GLUCOSE-CAPILLARY: 99 mg/dL (ref 65–99)
GLUCOSE-CAPILLARY: 80 mg/dL (ref 65–99)
Glucose-Capillary: 113 mg/dL — ABNORMAL HIGH (ref 65–99)
Glucose-Capillary: 107 mg/dL — ABNORMAL HIGH (ref 65–99)

## 2017-06-15 LAB — CSF CULTURE W GRAM STAIN: Culture: NO GROWTH

## 2017-06-15 LAB — CSF CULTURE

## 2017-06-15 LAB — MAGNESIUM: Magnesium: 1.9 mg/dL (ref 1.7–2.4)

## 2017-06-15 MED ORDER — DIPHENOXYLATE-ATROPINE 2.5-0.025 MG PO TABS
1.0000 | ORAL_TABLET | Freq: Four times a day (QID) | ORAL | Status: AC
  Administered 2017-06-15 – 2017-06-16 (×4): 1 via ORAL
  Filled 2017-06-15 (×4): qty 1

## 2017-06-15 MED ORDER — DIPHENOXYLATE-ATROPINE 2.5-0.025 MG/5ML PO LIQD: 5.0000 mL | Freq: Four times a day (QID) | ORAL | Status: DC

## 2017-06-15 NOTE — Progress Notes (Signed)
I contacted pt's grandmother by phone to discuss caregiver support at home since we discussed this need during our family conference with she and pt's mother weeks ago. Grandmother is requesting SNF rehab for pt lives with her and she can not provide 24/7 min physical assist. She will contact me with Mom's number and I will further discuss with her. [phone]

## 2017-06-15 NOTE — Progress Notes (Signed)
PROGRESS NOTE    Justin Ball  GNF:[id number] DOB: 02-14-1991 DOA: 05/20/2017 PCP: Randall Hiss, MD  Outpatient Specialists:     Brief Narrative:  26 y/o ? HIV/AIDS diag 2013-high risk sexual behaviour  Documented virological failure on Tivicay/Truvada  anal intraepithelial neoplasia III s/p surgery Dr. Michaell Cowing 12/28/14  Rectal abscesses/submandibular abscesses Syphyllis Bipolar affecting care documented extensive medical noncompliance  Admit with unresponsiveness 5/28 initally found to have crytpo in CSF, Strep in blood  2/2 ICH and blown pupil, NS placed ventriculostomy for elevated ICP Extubated 05/26/17    Assessment & Plan:   Principal Problem:   Seizure (HCC) Active Problems:   HIV (human immunodeficiency virus infection) (HCC)   Rash and nonspecific skin eruption   Insomnia   Bipolar disorder (HCC)   Intersphincteric abscess   AIDS (HCC)   Headache   Meningoencephalitis   Respiratory failure (HCC)   Encephalopathy acute   Patient has nasogastric tube   History of syphilis   Tobacco abuse   Tachypnea   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 Rolanda Jay 6/19 - S/p ventriculostomy 5/28-6/8.  - Continue amphotericin and flucytosine per ID - Continue linezolid as per ID until 06/14/2017 - Continue keppra per neurology for seizure Tx - EVD removed 6/8  - LP repeated 6/12 with opening pressure - LP repeated 6/15 with opening pressure Patient has made significant improvements in mentation, coordination, cognition Speech therapy recommended regular diet with thin liquids Would keep Core-trak in place for nutrition for today  Dysphagia secondary to acute metabolic encephalopathy  Much better  Appreciate speech therapy  cognitive eval   Eating ~ 25 % only of meals--hold d/c of tube feed  Acute encephalopathy:   meningoencephalitis, seizures,  ischemic infarcts (no antiplatelet per neuro), metabolic derangements and  medications; likely complicated by delirium.   Numerous scattered areas of ischemia on MRI   Ativan prn agitation, off precedex since 6/8, avoid haldol with seizure disorder   Cut back Norco to q 8 prn severe pain/discontinued Benadryl as per Amphotericin B protocol 6/20  Streptococcus pneumoniae bacteremia - Linezolid per ID x14 days (6/6 - last dose on 6/21) - TTE ordered: No vegetations noted   Bordatella pneumonia - Was treated with doxycycline per ID, now off   AIDS: CD4 count April 2018 was 2.  - Hold ART per ID - Continue bactrim daily and weekly azithromycin ppx  Neutropenia: Suspected due to flucytosine.  - Monitor. -watch ANC--currently 1,000  Diarrhea: Acute, likely from tube feeds.  - Flexiseal - Anti-diarrheals lomotil for 4 doses and re-assess needs for flexiseal  Chronic hepatitis B - Follow up with ID    scd Inpatient pending resolution No family present today Patient's plan has been updated to include skilled nursing care facility vs CIR  Consultants:    Multiple  Procedures:    multiple  Antimicrobials:   See notes    Subjective:  Slight confused but better No new issues per RN   Objective: Vitals:   06/15/17 0907 06/15/17 0908 06/15/17 1100 06/15/17 1424  BP: 97/61  95/72 105/69  Pulse: 89 80 (!) 115 (!) 112  Resp: 17 18 17 17   Temp:  98.4 F (36.9 C) 98.7 F (37.1 C)   TempSrc:  Oral Oral   SpO2: 100%  100% 100%  Weight:      Height:        Intake/Output Summary (Last 24 hours) at 06/15/17 1500  Last data filed at 06/15/17 1500  Gross per 24 hour  Intake             1290 ml  Output             5800 ml  Net            -4510 ml   Filed Weights   06/13/17 0438 06/14/17 0410 06/15/17 0340  Weight: 69.1 kg (152 lb 5.4 oz) 66.8 kg (147 lb 4.3 oz) 66.2 kg (145 lb 15.1 oz)    Examination:   Awake alert answer approp perrla Post op changes  stable, out of restraints, sitter at bedside NG tube in place, flexiseal in place S1-S2 no murmur cta B  Data Reviewed: I have personally reviewed following labs and imaging studies  CBC:  Recent Labs Lab 06/11/17 0446 06/12/17 0352 06/13/17 0306 06/14/17 0345 06/15/17 0709  WBC 2.3* 2.6* 2.7* 3.4* 1.9*  NEUTROABS 1.6* 1.6* 1.5* 2.0 1.0*  HGB 10.1* 10.1* 9.5* 9.8* 9.5*  HCT 30.4* 30.4* 29.0* 29.7* 29.2*  MCV 89.9 88.4 89.2 88.1 89.8  PLT 254 246 258 278 290   Basic Metabolic Panel:  Recent Labs Lab 06/10/17 0459 06/11/17 0446 06/12/17 0352 06/13/17 0306 06/14/17 0345 06/15/17 0709  NA 136 134* 134* 135 134* 139  K 4.0 3.8 3.9 4.3 4.3 3.9  CL 106 104 104 106 105 107  CO2 25 24 23 23 24 25   GLUCOSE 91 96 103* 91 99 80  BUN 8 11 12 7 6 7   CREATININE 0.64 0.63 0.64 0.81 0.75 0.69  CALCIUM 9.2 8.8* 9.0 8.6* 8.9 9.0  MG 1.3* 1.6* 1.7 1.8 1.8 1.9  PHOS 5.5* 5.8* 6.1* 6.5*  --  6.2*   GFR: Estimated Creatinine Clearance: 131 mL/min (by C-G formula based on SCr of 0.69 mg/dL). Liver Function Tests:  Recent Labs Lab 06/12/17 0352 06/13/17 0306 06/13/17 1030 06/14/17 0345 06/15/17 0709  AST  --   --  65* 61*  --   ALT  --   --  69* 66*  --   ALKPHOS  --   --  134* 151*  --   BILITOT  --   --  0.2* 0.3  --   PROT  --   --  7.4 8.0  --   ALBUMIN 2.7* 2.5* 2.5* 2.8* 2.6*   No results for input(s): LIPASE, AMYLASE in the last 168 hours. No results for input(s): AMMONIA in the last 168 hours. Coagulation Profile: No results for input(s): INR, PROTIME in the last 168 hours. Cardiac Enzymes: No results for input(s): CKTOTAL, CKMB, CKMBINDEX, TROPONINI in the last 168 hours. BNP (last 3 results) No results for input(s): PROBNP in the last 8760 hours. HbA1C: No results for input(s): HGBA1C in the last 72 hours. CBG:  Recent Labs Lab 06/14/17 2013 06/14/17 2259 06/15/17 0336 06/15/17 0914 06/15/17 1104  GLUCAP 129* 90 97 91 99   Lipid Profile: No results  for input(s): CHOL, HDL, LDLCALC, TRIG, CHOLHDL, LDLDIRECT in the last 72 hours. Thyroid Function Tests: No results for input(s): TSH, T4TOTAL, FREET4, T3FREE, THYROIDAB in the last 72 hours. Anemia Panel: No results for input(s): VITAMINB12, FOLATE, FERRITIN, TIBC, IRON, RETICCTPCT in the last 72 hours. Urine analysis:    Component Value Date/Time   COLORURINE YELLOW 05/20/2017 1742   APPEARANCEUR CLEAR 05/20/2017 1742   LABSPEC 1.020 05/20/2017 1742   PHURINE 7.0 05/20/2017 1742   GLUCOSEU NEGATIVE 05/20/2017 1742   HGBUR NEGATIVE 05/20/2017 1742  BILIRUBINUR NEGATIVE 05/20/2017 1742   KETONESUR NEGATIVE 05/20/2017 1742   PROTEINUR NEGATIVE 05/20/2017 1742   UROBILINOGEN 4.0 (H) 09/09/2014 1055   NITRITE NEGATIVE 05/20/2017 1742   LEUKOCYTESUR NEGATIVE 05/20/2017 1742   Sepsis Labs: @LABRCNTIP (procalcitonin:4,lacticidven:4)  ) Recent Results (from the past 240 hour(s))  CSF culture with Stat gram stain     Status: None   Collection Time: 06/08/17  1:28 PM  Result Value Ref Range Status   Specimen Description CSF  Final   Special Requests Immunocompromised TUBE 2  Final   Gram Stain   Final    WBC PRESENT, PREDOMINANTLY MONONUCLEAR CYTOSPIN SMEAR CORRECTED RESULTS BUDDING YEAST SEEN PREVIOUSLY REPORTED AS: NO ORGANISMS SEEN CORRECTED RESULTS CALLED TO: RN Patsey Berthold [id number] MLM    Culture NO GROWTH 7 DAYS  Final   Report Status 06/15/2017 FINAL  Final  Culture, fungus without smear     Status: None (Preliminary result)   Collection Time: 06/08/17  1:28 PM  Result Value Ref Range Status   Specimen Description CSF  Final   Special Requests Immunocompromised TUBE 2  Final   Culture NO FUNGUS ISOLATED AFTER 7 DAYS  Final   Report Status PENDING  Incomplete     Radiology Studies: No results found. Scheduled Meds: . acetaminophen  650 mg Oral Q24H  . azithromycin  1,200 mg Per Tube Weekly  . chlorhexidine  15 mL Mouth Rinse BID  . Chlorhexidine Gluconate Cloth   6 each Topical Daily  . flucytosine  1,500 mg Per Tube Q6H  . insulin aspart  2-6 Units Subcutaneous Q4H  . lidocaine (PF)  10 mL Intradermal Once  . mouth rinse  15 mL Mouth Rinse q12n4p  . metoprolol tartrate  5 mg Intravenous Q8H  . potassium chloride  40 mEq Oral Daily  . sulfamethoxazole-trimethoprim  10 mL Per Tube Daily  . tamsulosin  0.4 mg Oral Daily   Continuous Infusions: . sodium chloride Stopped (06/05/17 0200)  . amphotericin  B  Liposome (AMBISOME) ADULT IV Stopped (06/14/17 2012)  . dextrose 5 % and 0.45% NaCl 50 mL/hr at 06/14/17 1652  . feeding supplement (VITAL AF 1.2 CAL) Stopped (06/14/17 1638)  . levETIRAcetam Stopped (06/15/17 1127)  . sodium chloride 1,000 mL (06/07/17 2200)  . sodium chloride       LOS: 26 days    Time spent: 42    Pleas Koch, MD [facility]   If 7PM-7AM, please contact night-coverage [email or web address] Password [email or web address] 06/15/2017, 3:00 PM

## 2017-06-15 NOTE — Progress Notes (Signed)
[facility]    Date of Admission:  05/20/2017   Total days of antibiotics 27        Day 27L-ampho        Day 27 flucytosine        Day 27 oi proph   ID: Justin Ball is a 26 y.o. 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  Principal Problem:   Seizure (HCC) Active Problems:   HIV (human immunodeficiency virus infection) (HCC)   Rash and nonspecific skin eruption   Insomnia   Bipolar disorder (HCC)   Intersphincteric abscess   AIDS (HCC)   Headache   Meningoencephalitis   Respiratory failure (HCC)   Encephalopathy acute   Patient has nasogastric tube   History of syphilis   Tobacco abuse   Tachypnea    Subjective: Since having a sitter, patient has been more cooperative, participating with PT who is recommending CIR. \  Labs: fungal cx since 6/15 NGTD at day 7  Medications:  . acetaminophen  650 mg Oral Q24H  . azithromycin  1,200 mg Per Tube Weekly  . chlorhexidine  15 mL Mouth Rinse BID  . Chlorhexidine Gluconate Cloth  6 each Topical Daily  . diphenoxylate-atropine  1 tablet Oral Q6H  . flucytosine  1,500 mg Per Tube Q6H  . insulin aspart  2-6 Units Subcutaneous Q4H  . lidocaine (PF)  10 mL Intradermal Once  . mouth rinse  15 mL Mouth Rinse q12n4p  . metoprolol tartrate  5 mg Intravenous Q8H  . potassium chloride  40 mEq Oral Daily  . sulfamethoxazole-trimethoprim  10 mL Per Tube Daily  . tamsulosin  0.4 mg Oral Daily    Objective: Vital signs in last 24 hours: Temp:  [98.3 F (36.8 C)-98.7 F (37.1 C)] 98.4 F (36.9 C) (06/22 1948) Pulse Rate:  [80-115] 84 (06/22 1948) Resp:  [16-18] 16 (06/22 1948) BP: (95-105)/(54-72) 99/59 (06/22 1948) SpO2:  [99 %-100 %] 99 % (06/22 1948) Weight:  [145 lb 15.1 oz (66.2 kg)] 145 lb 15.1 oz (66.2 kg) (06/22 0340) Physical Exam  Constitutional: He is oriented to person,only. He appears chronically ill. No distress. Bandaged covering his VP shunt scalp incision HENT: bridled  dobhoff tube in place Mouth/Throat: Oropharynx is clear and moist. No oropharyngeal exudate.  Cardiovascular: Normal rate, regular rhythm and normal heart sounds. Exam reveals no gallop and no friction rub.  No murmur heard.  Pulmonary/Chest: Effort normal and breath sounds normal. No respiratory distress. He has no wheezes.  Abdominal: Soft. Bowel sounds are normal. He exhibits no distension. There is no tenderness.  Neurological: He is alert and oriented to self. Cognitive slowing in answering questions but is answering 60-75% of questions Skin: Skin is warm and dry. No rash noted. No erythema.  Psychiatric: He has a normal mood and affect. His behavior is normal.     Lab Results  Recent Labs  06/14/17 0345 06/15/17 0709  WBC 3.4* 1.9*  HGB 9.8* 9.5*  HCT 29.7* 29.2*  NA 134* 139  K 4.3 3.9  CL 105 107  CO2 24 25  BUN 6 7  CREATININE 0.75 0.69   Liver Panel  Recent Labs  06/13/17 1030 06/14/17 0345 06/15/17 0709  PROT 7.4 8.0  --   ALBUMIN 2.5* 2.8* 2.6*  AST 65* 61*  --   ALT 69* 66*  --   ALKPHOS 134* 151*  --   BILITOT 0.2* 0.3  --  BILIDIR 0.2  --   --   IBILI 0.0*  --   --     Microbiology: 6/15 csf - yeast on gram stain but culture aerobic negative at 7 days,(which is now discarded) ,but fungal cx is negative at 7 days (Which is held for 28d) 6/5 csf - staph epi Studies/Results: No results found.   Assessment/Plan: 26yo M with untreated hiv disease admitted for seizure/loss of consciousness concerning for uncal herniation due to CM s/p EVD now converted to VP shunt  Staph epi meningitis related to EVD = now removed. Finished course of linezolid today  Cryptococcal meningitis = continue with L-ampho plus flucytosine for additional 3days and plan to switch to fluconazole on Tuesday  HIV disease= will plan to start ART at next week  oi proph = continue with daily bactrim plus weekly azithromycin  Leukopenia = possibly due to linezolid. Vs HIV.  linezolid has been stopped  dispo = I believe he has the potential to improve with OT and PT. Severely deconditioned since admit due to cryptococcal meningitis. I believe he would improve with therapies such as those given for stroke patients.   Mental status = though it waxes and wanes, it appears improved with having a sitter and less sedation.  Dr Orvan Falconercampbell available over the weekend  [facility], [facility] Cell: [phone] Pager: [phone]  06/15/2017, 8:21 PM

## 2017-06-15 NOTE — Progress Notes (Signed)
Physical Therapy Treatment Patient Details Name: Justin Ball MRN: [id number] DOB: Jul 14, 1991 Today's Date: 06/15/2017    History of Present Illness Pt is a 26 year old man admitted s/p seizure activity having several days of recent headaches. 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 EVD was placed after acute decompensation on 5/28 with decorticate posturing and asymmetric pupils. 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 PMH includes: syphilis, substance abuse, soft palate ulceration, scabies, neuromuscular disorder, HIV/AIDS, depression, bipolar disorder, anxiety    PT Comments    Patient continues to progress well toward mobility goals and is demonstrating increased activity tolerance. Pt is following commands consistently and worked on OOB mobility and sitting in chair end of session. Continue to recommend CIR for further skilled PT services.    Follow Up Recommendations  CIR     Equipment Recommendations  Wheelchair (measurements PT);Wheelchair cushion (measurements PT);Hospital bed    Recommendations for Other Services Rehab consult     Precautions / Restrictions Precautions Precautions: Fall Precaution Comments: flexiseal, foley, NG tube, IV     Mobility  Bed Mobility Overal bed mobility: Needs Assistance Bed Mobility: Supine to Sit     Supine to sit: Mod assist Sit to supine: Min assist   General bed mobility comments: pt initiated sitting with vc and required assist to bring bilat LE from EOB (pt almost got them there) and to elevate trunk into sitting  Transfers Overall transfer level: Needs assistance Equipment used: Rolling walker (2 wheeled);2 person hand held assist Transfers: Sit to/from Stand Sit to Stand: Min assist;Mod assist;+2 safety/equipment Stand pivot transfers: Mod assist;+2 safety/equipment       General transfer comment: stood X2 from EOB first trial with use of RW  and second with HHA; min A +2 to power up into standing and mod A upon standing to maintain balance; cues for sequencing to pivot EOB to recliner  Ambulation/Gait       Gait Pattern/deviations: Step-to pattern;Decreased step length - right;Decreased step length - left;Narrow base of support     General Gait Details: pt took 4-5 steps forward and backward with +2 HHA assist for balance; cues for sequencing, posture, and widening BOS; pt declined further gait due to c/o pain in R knee   Stairs            Wheelchair Mobility    Modified Rankin (Stroke Patients Only)       Balance Overall balance assessment: Needs assistance Sitting-balance support: Feet supported Sitting balance-Leahy Scale: Fair Sitting balance - Comments: Pt sat EOB x 15 mins with min guard assist  Postural control: Posterior lean;Left lateral lean Standing balance support: Bilateral upper extremity supported;During functional activity Standing balance-Leahy Scale: Poor                              Cognition Arousal/Alertness: Awake/alert Behavior During Therapy: Flat affect Overall Cognitive Status: Impaired/Different from baseline Area of Impairment: Orientation;Attention;Memory;Following commands;Safety/judgement;Awareness;Problem solving                 Orientation Level: Disoriented to;Place;Time;Situation Current Attention Level: Sustained Memory: Decreased short-term memory Following Commands: Follows one step commands consistently;Follows one step commands with increased time Safety/Judgement: Decreased awareness of safety;Decreased awareness of deficits Awareness: Intellectual Problem Solving: Slow processing;Decreased initiation;Difficulty sequencing;Requires verbal cues;Requires tactile cues General Comments: pt oriented to self and able to state birthday      Exercises Other Exercises Other Exercises: Pt  performed reaching with each UE     General Comments General  comments (skin integrity, edema, etc.): HR up to 140s with OOB mobility and at rest 120 or below      Pertinent Vitals/Pain Pain Assessment: Faces Faces Pain Scale: Hurts even more Pain Location: R knee Pain Descriptors / Indicators: Grimacing;Guarding;Moaning Pain Intervention(s): Limited activity within patient's tolerance;Monitored during session;Repositioned    Home Living                      Prior Function            PT Goals (current goals can now be found in the care plan section) Acute Rehab PT Goals Patient Stated Goal: unable to state Progress towards PT goals: Progressing toward goals    Frequency    Min 3X/week      PT Plan Current plan remains appropriate;Frequency needs to be updated    Co-evaluation              AM-PAC PT "6 Clicks" Daily Activity  Outcome Measure  Difficulty turning over in bed (including adjusting bedclothes, sheets and blankets)?: A Little Difficulty moving from lying on back to sitting on the side of the bed? : Total Difficulty sitting down on and standing up from a chair with arms (e.g., wheelchair, bedside commode, etc,.)?: Total Help needed moving to and from a bed to chair (including a wheelchair)?: A Lot Help needed walking in hospital room?: A Lot Help needed climbing 3-5 steps with a railing? : Total 6 Click Score: 10    End of Session Equipment Utilized During Treatment: Gait belt Activity Tolerance: Patient tolerated treatment well Patient left: in chair;with call bell/phone within reach;with nursing/sitter in room;Other (comment) (sitter present) Nurse Communication: Mobility status PT Visit Diagnosis: Other symptoms and signs involving the nervous system (R29.898);Muscle weakness (generalized) (M62.81);Difficulty in walking, not elsewhere classified (R26.2)     Time: [id number] PT Time Calculation (min) (ACUTE ONLY): 24 min  Charges:  $Gait Training: 8-22 mins $Therapeutic Activity: 8-22 mins                     G Codes:       Erline LevineKellyn Luva Metzger, PTA Pager: [phone]     Carolynne EdouardKellyn R Angeleah Labrake 06/15/2017, 3:46 PM

## 2017-06-15 NOTE — Progress Notes (Signed)
  Speech Language Pathology Treatment: Dysphagia;Cognitive-Linquistic  Patient Details Name: Justin SpurrLinston R Ball MRN: [id number] DOB: 03/25/1991 Today's Date: 06/15/2017 Time: 1000-1030 SLP Time Calculation (min) (ACUTE ONLY): 30 min  Assessment / Plan / Recommendation Clinical Impression  Pt repositioned for meal, agreeable to intake, SLp provided cueing ranging from verbal instruction to min tactile cues to hand over hand assist and total assist depending on pts attention and initiation with self feeding. Mastication is still impaired with munching pattern, but functional with solid textures. No signs of aspiration with liquids. Pt to continue current diet with full supervision and assist, will follow for tolerance.   HPI HPI: Pt is a 26 year old man admitted s/p seizure activity having several days of recent headaches. 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 EVD was placed after acute decompensation on 5/28 with decorticate posturing and asymmetric pupils. 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 PMH includes: syphilis, substance abuse, soft palate ulceration, scabies, neuromuscular disorder, HIV/AIDS, depression, bipolar disorder, anxiety      SLP Plan  Continue with current plan of care       Recommendations  Diet recommendations: Regular;Thin liquid Liquids provided via: Straw Medication Administration: Whole meds with liquid Supervision: Staff to assist with self feeding;Full supervision/cueing for compensatory strategies Compensations: Minimize environmental distractions;Slow rate;Small sips/bites Postural Changes and/or Swallow Maneuvers: Seated upright 90 degrees;Upright 30-60 min after meal                Oral Care Recommendations: Oral care BID Follow up Recommendations: Inpatient Rehab SLP Visit Diagnosis: Dysphagia, unspecified (R13.10) Plan: Continue with current plan of care       GO                 Demario Faniel, Riley NearingBonnie Caroline 06/15/2017, 11:42 AM

## 2017-06-15 NOTE — Evaluation (Signed)
Occupational Therapy Evaluation Patient Details Name: Justin Ball MRN: [id number] DOB: 05-Nov-1991 Today's Date: 06/15/2017    History of Present Illness Pt is a 26 year old man admitted s/p seizure activity having several days of recent headaches. 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 EVD was placed after acute decompensation on 5/28 with decorticate posturing and asymmetric pupils. 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 PMH includes: syphilis, substance abuse, soft palate ulceration, scabies, neuromuscular disorder, HIV/AIDS, depression, bipolar disorder, anxiety   Clinical Impression   Pt continues to make great progress.  He is now able to sustain attention for short periods of time to complete simple grooming with min guard assist.  He continues to have variable orientation.  Min - mod A for bed mobility  And min guard assist for EOB sitting.  Recommend CIR.     Follow Up Recommendations  Supervision/Assistance - 24 hour    Equipment Recommendations  None recommended by OT    Recommendations for Other Services Rehab consult     Precautions / Restrictions Precautions Precautions: Fall Precaution Comments: flexiseal, foley, NG tube, IV       Mobility Bed Mobility Overal bed mobility: Needs Assistance Bed Mobility: Sit to Supine;Supine to Sit     Supine to sit: Mod assist Sit to supine: Min assist   General bed mobility comments: assist to initiate task and to move LEs off bed   Transfers                 General transfer comment: Pt fatigued and did not attempt     Balance Overall balance assessment: Needs assistance Sitting-balance support: Feet supported Sitting balance-Leahy Scale: Fair Sitting balance - Comments: Pt sat EOB x 15 mins with min guard assist  Postural control: Posterior lean;Left lateral lean                                 ADL either performed  or assessed with clinical judgement   ADL Overall ADL's : Needs assistance/impaired     Grooming: Wash/dry hands;Wash/dry face;Supervision/safety;Sitting                                       Vision         Perception     Praxis      Pertinent Vitals/Pain Pain Assessment: Faces Faces Pain Scale: No hurt     Hand Dominance     Extremity/Trunk Assessment             Communication     Cognition Arousal/Alertness: Awake/alert Behavior During Therapy: Flat affect Overall Cognitive Status: Impaired/Different from baseline Area of Impairment: Orientation;Attention;Memory;Following commands;Safety/judgement;Awareness;Problem solving                 Orientation Level: Disoriented to;Place;Time;Situation Current Attention Level: Sustained Memory: Decreased short-term memory Following Commands: Follows one step commands consistently;Follows one step commands with increased time Safety/Judgement: Decreased awareness of safety;Decreased awareness of deficits Awareness: Intellectual Problem Solving: Slow processing;Decreased initiation;Difficulty sequencing;Requires verbal cues;Requires tactile cues General Comments: Pt is oriented to self.  He thinks it's 2007, but was able to deduce year when given cues.  He thinks it's December and says he's sick.  He follows one step commands today, and sustains attention x 20-30 seconds for simple grooming tasks    General Comments  HR to 126    Exercises Exercises: Other exercises Other Exercises Other Exercises: Pt performed reaching with each UE    Shoulder Instructions      Home Living                                          Prior Functioning/Environment                   OT Problem List:        OT Treatment/Interventions:      OT Goals(Current goals can be found in the care plan section) Acute Rehab OT Goals Patient Stated Goal: unable to state OT Goal Formulation:  Patient unable to participate in goal setting Time For Goal Achievement: 06/29/17 Potential to Achieve Goals: Good ADL Goals Pt Will Perform Eating: with min assist;sitting Pt Will Perform Grooming: with min guard assist;sitting Pt Will Perform Upper Body Bathing: with mod assist;sitting Pt Will Perform Lower Body Bathing: with mod assist;sit to/from stand Pt Will Transfer to Toilet: with min assist;squat pivot transfer;bedside commode Additional ADL Goal #1: Pt will be oriented x 3 with min cues   OT Frequency: Min 3X/week   Barriers to D/C:            Co-evaluation              AM-PAC PT "6 Clicks" Daily Activity     Outcome Measure Help from another person eating meals?: A Lot Help from another person taking care of personal grooming?: A Lot Help from another person toileting, which includes using toliet, bedpan, or urinal?: A Lot Help from another person bathing (including washing, rinsing, drying)?: Total Help from another person to put on and taking off regular upper body clothing?: Total Help from another person to put on and taking off regular lower body clothing?: Total 6 Click Score: 9   End of Session Nurse Communication: Mobility status  Activity Tolerance: Patient limited by fatigue Patient left: in bed;with call bell/phone within reach;with nursing/sitter in room  OT Visit Diagnosis: Unsteadiness on feet (R26.81);Other abnormalities of gait and mobility (R26.89);Muscle weakness (generalized) (M62.81);Feeding difficulties (R63.3);Other symptoms and signs involving the nervous system (R29.898);Other symptoms and signs involving cognitive function;Cognitive communication deficit (R41.841)                Time: [id number] OT Time Calculation (min): 21 min Charges:  OT General Charges $OT Visit: 1 Procedure OT Treatments $Self Care/Home Management : 8-22 mins G-Codes:     Jeani HawkingWendi Kaylub Detienne, OTR/L [phone]   Jeani HawkingConarpe, Raylin Winer M 06/15/2017, 2:31 PM

## 2017-06-15 NOTE — Clinical Social Work Note (Signed)
CSW faxed requested information to [facility] for [facility] review.  Charlynn CourtSarah Lorel Lembo, CSW [phone]

## 2017-06-16 LAB — GLUCOSE, CAPILLARY
GLUCOSE-CAPILLARY: 72 mg/dL (ref 65–99)
GLUCOSE-CAPILLARY: 76 mg/dL (ref 65–99)
Glucose-Capillary: 77 mg/dL (ref 65–99)
Glucose-Capillary: 95 mg/dL (ref 65–99)
Glucose-Capillary: 101 mg/dL — ABNORMAL HIGH (ref 65–99)
Glucose-Capillary: 141 mg/dL — ABNORMAL HIGH (ref 65–99)

## 2017-06-16 LAB — CBC WITH DIFFERENTIAL/PLATELET
BASOS PCT: 2 %
Basophils Absolute: 0 10*3/uL (ref 0.0–0.1)
Eosinophils Absolute: 0.1 10*3/uL (ref 0.0–0.7)
Eosinophils Relative: 3 %
HCT: 29.2 % — ABNORMAL LOW (ref 39.0–52.0)
Hemoglobin: 9.6 g/dL — ABNORMAL LOW (ref 13.0–17.0)
LYMPHS ABS: 0.6 10*3/uL — AB (ref 0.7–4.0)
Lymphocytes Relative: 31 %
MCH: 29.4 pg (ref 26.0–34.0)
MCHC: 32.9 g/dL (ref 30.0–36.0)
MCV: 89.6 fL (ref 78.0–100.0)
MONO ABS: 0.3 10*3/uL (ref 0.1–1.0)
Monocytes Relative: 14 %
NEUTROS ABS: 0.8 10*3/uL — AB (ref 1.7–7.7)
Neutrophils Relative %: 50 %
PLATELETS: 274 10*3/uL (ref 150–400)
RBC: 3.26 MIL/uL — ABNORMAL LOW (ref 4.22–5.81)
RDW: 14.1 % (ref 11.5–15.5)
SMEAR REVIEW: ADEQUATE
WBC: 1.8 10*3/uL — ABNORMAL LOW (ref 4.0–10.5)

## 2017-06-16 LAB — COMPREHENSIVE METABOLIC PANEL
ALBUMIN: 2.8 g/dL — AB (ref 3.5–5.0)
ALK PHOS: 142 U/L — AB (ref 38–126)
ALT: 72 U/L — ABNORMAL HIGH (ref 17–63)
ANION GAP: 10 (ref 5–15)
AST: 68 U/L — ABNORMAL HIGH (ref 15–41)
BILIRUBIN TOTAL: 0.4 mg/dL (ref 0.3–1.2)
BUN: 8 mg/dL (ref 6–20)
CALCIUM: 9.1 mg/dL (ref 8.9–10.3)
CO2: 20 mmol/L — ABNORMAL LOW (ref 22–32)
Chloride: 109 mmol/L (ref 101–111)
Creatinine, Ser: 0.79 mg/dL (ref 0.61–1.24)
GFR calc Af Amer: >60 mL/min (ref >60)
GFR calc non Af Amer: >60 mL/min (ref >60)
GLUCOSE: 90 mg/dL (ref 65–99)
Potassium: 3.7 mmol/L (ref 3.5–5.1)
Sodium: 139 mmol/L (ref 135–145)
TOTAL PROTEIN: 7.6 g/dL (ref 6.5–8.1)

## 2017-06-16 LAB — RENAL FUNCTION PANEL
Albumin: 2.7 g/dL — ABNORMAL LOW (ref 3.5–5.0)
Anion gap: 6 (ref 5–15)
BUN: 6 mg/dL (ref 6–20)
CHLORIDE: 109 mmol/L (ref 101–111)
CO2: 23 mmol/L (ref 22–32)
CREATININE: 0.75 mg/dL (ref 0.61–1.24)
Calcium: 9.2 mg/dL (ref 8.9–10.3)
GFR calc non Af Amer: >60 mL/min (ref >60)
GLUCOSE: 94 mg/dL (ref 65–99)
Phosphorus: 6.2 mg/dL — ABNORMAL HIGH (ref 2.5–4.6)
Potassium: 3.7 mmol/L (ref 3.5–5.1)
Sodium: 138 mmol/L (ref 135–145)

## 2017-06-16 LAB — MAGNESIUM: Magnesium: 1.5 mg/dL — ABNORMAL LOW (ref 1.7–2.4)

## 2017-06-16 LAB — PROTIME-INR
INR: 0.92
Prothrombin Time: 12.3 seconds (ref 11.4–15.2)

## 2017-06-16 MED ORDER — LEVETIRACETAM 500 MG PO TABS
1000.0000 mg | ORAL_TABLET | Freq: Two times a day (BID) | ORAL | Status: DC
  Administered 2017-06-16 – 2017-06-27 (×23): 1000 mg via ORAL
  Filled 2017-06-16 (×23): qty 2

## 2017-06-16 MED ORDER — DEXTROSE-NACL 5-0.45 % IV SOLN
INTRAVENOUS | Status: DC
  Administered 2017-06-17 – 2017-06-20 (×2): via INTRAVENOUS

## 2017-06-16 MED ORDER — FREE WATER
200.0000 mL | Freq: Three times a day (TID) | Status: DC
  Administered 2017-06-16 – 2017-06-17 (×5): 200 mL

## 2017-06-16 MED ORDER — HYDROCODONE-ACETAMINOPHEN 5-325 MG PO TABS: 1.0000 | ORAL_TABLET | Freq: Two times a day (BID) | ORAL | Status: DC | PRN

## 2017-06-16 MED ORDER — SULFAMETHOXAZOLE-TRIMETHOPRIM 200-40 MG/5ML PO SUSP
10.0000 mL | ORAL | Status: DC
  Administered 2017-06-18 – 2017-06-25 (×4): 10 mL
  Filled 2017-06-16 (×4): qty 10

## 2017-06-16 MED ORDER — LORAZEPAM 2 MG/ML IJ SOLN
1.0000 mg | Freq: Four times a day (QID) | INTRAMUSCULAR | Status: DC | PRN
  Administered 2017-06-16 – 2017-06-17 (×2): 1 mg via INTRAVENOUS
  Filled 2017-06-16 (×2): qty 1

## 2017-06-16 MED ORDER — MAGNESIUM SULFATE 2 GM/50ML IV SOLN
2.0000 g | Freq: Once | INTRAVENOUS | Status: AC
  Administered 2017-06-16: 2 g via INTRAVENOUS
  Filled 2017-06-16: qty 50

## 2017-06-16 NOTE — Progress Notes (Signed)
PROGRESS NOTE    Justin Ball  KGM:[id number] DOB: 12/03/1991 DOA: 05/20/2017 PCP: Randall HissVan Dam, [name], MD  Outpatient Specialists:     Brief Narrative:  26 y/o ? HIV/AIDS diag 2013-high risk sexual behaviour  Documented virological failure on Tivicay/Truvada  anal intraepithelial neoplasia III s/p surgery Dr. [name] 12/28/14  Rectal abscesses/submandibular abscesses Syphyllis Bipolar affecting care documented extensive medical noncompliance  Admit with unresponsiveness 5/28 initally found to have crytpo in CSF, Strep in blood  2/2 ICH and blown pupil, NS placed ventriculostomy for elevated ICP Extubated 05/26/17    Assessment & Plan:   Principal Problem:   Seizure (HCC) Active Problems:   HIV (human immunodeficiency virus infection) (HCC)   Rash and nonspecific skin eruption   Insomnia   Bipolar disorder (HCC)   Intersphincteric abscess   AIDS (HCC)   Headache   Meningoencephalitis   Respiratory failure (HCC)   Encephalopathy acute   Patient has nasogastric tube   History of syphilis   Tobacco abuse   Tachypnea   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 [name] 6/19 - S/p ventriculostomy 5/28-6/8.  - Continue amphotericin per ID - Continue linezolid as per ID until 06/14/2017, flucytosine d/c 6/23 - Continue keppra per neurology for seizure Tx - Bactrim changed to 3 times weekly 6/23 - EVD removed 6/8  - LP repeated 6/12 with opening pressure 27mmHg - LP repeated 6/15 with opening pressure 19mmHg Patient has made some improvements in mentation, coordination, cognition Speech therapy recommended regular diet with thin liquids D/c d5/ns 50 cc/h 6/23, can keep minimal vol for L sided PICC Would keep Core-trak in place for nutrition as the patient is only eating minimally and still is slightly confused -space out norco to q12, patient not in pain -space out ativan to  q 6 -likely to floor in am  Dysphagia secondary to acute metabolic encephalopathy  Much better  Appreciate speech therapy  cognitive eval   Eating ~ 25--40 % only of meals--hold d/c of tube feed  Acute encephalopathy:   meningoencephalitis, seizures, ischemic infarcts (no antiplatelet per neuro), metabolic derangements and  medications; likely complicated by delirium.   Numerous scattered areas of ischemia on MRI   Ativan prn agitation, off precedex since 6/8, avoid haldol with seizure disorder   Cut back Norco to q 8 prn severe pain/discontinued Benadryl as per Amphotericin B protocol 6/20  Endorse sleep-wake cycle, shades up during day, OOB meals etc  Streptococcus pneumoniae bacteremia - Linezolid per ID x14 days (6/6 - last dose on 6/21) - TTE ordered: No vegetations noted   Bordatella pneumonia - Was treated with doxycycline per ID, now off   AIDS: CD4 count April 2018 was 2.  - Hold ART per ID - Continue bactrim daily--->3 x /wk and weekly azithromycin ppx  Neutropenia: Suspected due to flucytosine.  - Monitor. -watch ANC--currently 800  Diarrhea: Acute, likely from tube feeds.  - Flexiseal to be pulled maybe later today - Anti-diarrheals lomotil for 4 doses and re-assess needs for flexiseal  Chronic hepatitis B - Follow up with ID    scd Inpatient pending resolution No family present today Patient's plan has been updated to include skilled nursing care facility vs CIR  Consultants:    Multiple  Procedures:    multiple  Antimicrobials:   See notes    Subjective:  Awake, ate 40 % Poor verbal output Some echolalir but clearly improved   Objective: Vitals:   06/16/17  0700 06/16/17 0825 06/16/17 1100 06/16/17 1532  BP: (!) 122/102 90/65 (!) 98/57 97/76  Pulse: 74 80 (!) 105 89  Resp: 14 15 20 16   Temp: 98.1 F (36.7 C)  97.7 F (36.5 C) 98.4 F (36.9 C)  TempSrc: Oral  Oral Axillary  SpO2: 100% 100% 100% 100%  Weight:      Height:         Intake/Output Summary (Last 24 hours) at 06/16/17 1706 Last data filed at 06/16/17 1438  Gross per 24 hour  Intake             2528 ml  Output             6350 ml  Net            -3822 ml   Filed Weights   06/14/17 0410 06/15/17 0340 06/16/17 0319  Weight: 66.8 kg (147 lb 4.3 oz) 66.2 kg (145 lb 15.1 oz) 59.9 kg (132 lb 0.9 oz)    Examination:   Awake alert  perrla Post op changes stable, out of restraints, sitter at bedside NG tube in place, flexiseal in place S1-S2 no murmur cta B  Data Reviewed: I have personally reviewed following labs and imaging studies  CBC:  Recent Labs Lab 06/12/17 0352 06/13/17 0306 06/14/17 0345 06/15/17 0709 06/16/17 0423  WBC 2.6* 2.7* 3.4* 1.9* 1.8*  NEUTROABS 1.6* 1.5* 2.0 1.0* 0.8*  HGB 10.1* 9.5* 9.8* 9.5* 9.6*  HCT 30.4* 29.0* 29.7* 29.2* 29.2*  MCV 88.4 89.2 88.1 89.8 89.6  PLT 246 258 278 290 274   Basic Metabolic Panel:  Recent Labs Lab 06/11/17 0446 06/12/17 0352 06/13/17 0306 06/14/17 0345 06/15/17 0709 06/16/17 0423 06/16/17 0447  NA 134* 134* 135 134* 139 139 138  K 3.8 3.9 4.3 4.3 3.9 3.7 3.7  CL 104 104 106 105 107 109 109  CO2 24 23 23 24 25  20* 23  GLUCOSE 96 103* 91 99 80 90 94  BUN 11 12 7 6 7 8 6   CREATININE 0.63 0.64 0.81 0.75 0.69 0.79 0.75  CALCIUM 8.8* 9.0 8.6* 8.9 9.0 9.1 9.2  MG 1.6* 1.7 1.8 1.8 1.9 1.5*  --   PHOS 5.8* 6.1* 6.5*  --  6.2*  --  6.2*   GFR: Estimated Creatinine Clearance: 118.6 mL/min (by C-G formula based on SCr of 0.75 mg/dL). Liver Function Tests:  Recent Labs Lab 06/13/17 1030 06/14/17 0345 06/15/17 0709 06/16/17 0423 06/16/17 0447  AST 65* 61*  --  68*  --   ALT 69* 66*  --  72*  --   ALKPHOS 134* 151*  --  142*  --   BILITOT 0.2* 0.3  --  0.4  --   PROT 7.4 8.0  --  7.6  --   ALBUMIN 2.5* 2.8* 2.6* 2.8* 2.7*   No results for input(s): LIPASE, AMYLASE in the last 168 hours. No results for input(s): AMMONIA in the last 168 hours. Coagulation  Profile:  Recent Labs Lab 06/16/17 0423  INR 0.92   Cardiac Enzymes: No results for input(s): CKTOTAL, CKMB, CKMBINDEX, TROPONINI in the last 168 hours. BNP (last 3 results) No results for input(s): PROBNP in the last 8760 hours. HbA1C: No results for input(s): HGBA1C in the last 72 hours. CBG:  Recent Labs Lab 06/15/17 2308 06/16/17 0313 06/16/17 0752 06/16/17 1204 06/16/17 1547  GLUCAP 80 77 76 95 101*   Lipid Profile: No results for input(s): CHOL, HDL, LDLCALC, TRIG, CHOLHDL, LDLDIRECT in  the last 72 hours. Thyroid Function Tests: No results for input(s): TSH, T4TOTAL, FREET4, T3FREE, THYROIDAB in the last 72 hours. Anemia Panel: No results for input(s): VITAMINB12, FOLATE, FERRITIN, TIBC, IRON, RETICCTPCT in the last 72 hours. Urine analysis:    Component Value Date/Time   COLORURINE YELLOW 05/20/2017 1742   APPEARANCEUR CLEAR 05/20/2017 1742   LABSPEC 1.020 05/20/2017 1742   PHURINE 7.0 05/20/2017 1742   GLUCOSEU NEGATIVE 05/20/2017 1742   HGBUR NEGATIVE 05/20/2017 1742   BILIRUBINUR NEGATIVE 05/20/2017 1742   KETONESUR NEGATIVE 05/20/2017 1742   PROTEINUR NEGATIVE 05/20/2017 1742   UROBILINOGEN 4.0 (H) 09/09/2014 1055   NITRITE NEGATIVE 05/20/2017 1742   LEUKOCYTESUR NEGATIVE 05/20/2017 1742   Sepsis Labs: @LABRCNTIP (procalcitonin:4,lacticidven:4)  ) Recent Results (from the past 240 hour(s))  CSF culture with Stat gram stain     Status: None   Collection Time: 06/08/17  1:28 PM  Result Value Ref Range Status   Specimen Description CSF  Final   Special Requests Immunocompromised TUBE 2  Final   Gram Stain   Final    WBC PRESENT, PREDOMINANTLY MONONUCLEAR CYTOSPIN SMEAR CORRECTED RESULTS BUDDING YEAST SEEN PREVIOUSLY REPORTED AS: NO ORGANISMS SEEN CORRECTED RESULTS CALLED TO: RN Patsey Berthold [id number] [phone]    Culture NO GROWTH 7 DAYS  Final   Report Status 06/15/2017 FINAL  Final  Culture, fungus without smear     Status: None (Preliminary  result)   Collection Time: 06/08/17  1:28 PM  Result Value Ref Range Status   Specimen Description CSF  Final   Special Requests Immunocompromised TUBE 2  Final   Culture NO FUNGUS ISOLATED AFTER 7 DAYS  Final   Report Status PENDING  Incomplete     Radiology Studies: No results found. Scheduled Meds: . acetaminophen  650 mg Oral Q24H  . azithromycin  1,200 mg Per Tube Weekly  . chlorhexidine  15 mL Mouth Rinse BID  . Chlorhexidine Gluconate Cloth  6 each Topical Daily  . insulin aspart  2-6 Units Subcutaneous Q4H  . levETIRAcetam  1,000 mg Oral BID  . lidocaine (PF)  10 mL Intradermal Once  . mouth rinse  15 mL Mouth Rinse q12n4p  . metoprolol tartrate  5 mg Intravenous Q8H  . potassium chloride  40 mEq Oral Daily  . [START ON 06/18/2017] sulfamethoxazole-trimethoprim  10 mL Per Tube Once per day on Mon Wed Fri  . tamsulosin  0.4 mg Oral Daily   Continuous Infusions: . sodium chloride Stopped (06/05/17 0200)  . amphotericin  B  Liposome (AMBISOME) ADULT IV Stopped (06/15/17 2013)  . dextrose 5 % and 0.45% NaCl 50 mL/hr at 06/15/17 2313  . feeding supplement (VITAL AF 1.2 CAL) Stopped (06/14/17 1638)  . sodium chloride 1,000 mL (06/07/17 2200)  . sodium chloride       LOS: 27 days    Time spent: 15    Pleas Koch, MD [facility]   If 7PM-7AM, please contact night-coverage [email or web address] Password [facility] 06/16/2017, 5:06 PM

## 2017-06-16 NOTE — Progress Notes (Signed)
Patient ID: Ola SpurrLinston R Hulme, male   DOB: 11/15/1991, 26 y.o.   MRN: [id number]          [facility]    Date of Admission:  05/20/2017   Total days of antibiotics 28 amphotericin and flucytosine  Mr. Richardson DoppCole was on therapy for cryptococcal meningitis. 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 His meningitis seems to be improving but he has developed progressive leukopenia. This is probably multifactorial and related to recent linezolid, flucytosine and trimethoprim sulfamethoxazole in the setting of advanced and untreated HIV infection. His linezolid has been stopped. I will stop his flucytosine today and change trimethoprim sulfamethoxazole to 3 times weekly. I will have Dr. Drue SecondSnider follow-up on Monday, 06/18/2017.         Cliffton AstersJohn Heaven Wandell, MD [facility] [phone] pager   [phone] 06/16/2017, 3:38 PM

## 2017-06-17 LAB — RENAL FUNCTION PANEL
Albumin: 2.9 g/dL — ABNORMAL LOW (ref 3.5–5.0)
Anion gap: 6 (ref 5–15)
BUN: 7 mg/dL (ref 6–20)
CO2: 23 mmol/L (ref 22–32)
Calcium: 9.3 mg/dL (ref 8.9–10.3)
Chloride: 109 mmol/L (ref 101–111)
Creatinine, Ser: 0.91 mg/dL (ref 0.61–1.24)
GFR calc Af Amer: >60 mL/min (ref >60)
Glucose, Bld: 85 mg/dL (ref 65–99)
POTASSIUM: 4.8 mmol/L (ref 3.5–5.1)
Phosphorus: 7 mg/dL — ABNORMAL HIGH (ref 2.5–4.6)
Sodium: 138 mmol/L (ref 135–145)

## 2017-06-17 LAB — GLUCOSE, CAPILLARY
GLUCOSE-CAPILLARY: 92 mg/dL (ref 65–99)
GLUCOSE-CAPILLARY: 94 mg/dL (ref 65–99)
GLUCOSE-CAPILLARY: 108 mg/dL — AB (ref 65–99)
Glucose-Capillary: 78 mg/dL (ref 65–99)
Glucose-Capillary: 87 mg/dL (ref 65–99)
Glucose-Capillary: 92 mg/dL (ref 65–99)

## 2017-06-17 LAB — CBC WITH DIFFERENTIAL/PLATELET
BASOS ABS: 0 10*3/uL (ref 0.0–0.1)
BASOS PCT: 1 %
Eosinophils Absolute: 0.1 10*3/uL (ref 0.0–0.7)
Eosinophils Relative: 3 %
HEMATOCRIT: 30 % — AB (ref 39.0–52.0)
Hemoglobin: 10.1 g/dL — ABNORMAL LOW (ref 13.0–17.0)
Lymphocytes Relative: 35 %
Lymphs Abs: 1.1 10*3/uL (ref 0.7–4.0)
MCH: 29.6 pg (ref 26.0–34.0)
MCHC: 33.7 g/dL (ref 30.0–36.0)
MCV: 88 fL (ref 78.0–100.0)
MONO ABS: 0.4 10*3/uL (ref 0.1–1.0)
Monocytes Relative: 14 %
NEUTROS ABS: 1.5 10*3/uL — AB (ref 1.7–7.7)
NEUTROS PCT: 47 %
Platelets: 280 10*3/uL (ref 150–400)
RBC: 3.41 MIL/uL — ABNORMAL LOW (ref 4.22–5.81)
RDW: 13.8 % (ref 11.5–15.5)
WBC: 3.1 10*3/uL — AB (ref 4.0–10.5)

## 2017-06-17 LAB — MAGNESIUM: MAGNESIUM: 1.7 mg/dL (ref 1.7–2.4)

## 2017-06-17 MED ORDER — ALTEPLASE 2 MG IJ SOLR
2.0000 mg | Freq: Once | INTRAMUSCULAR | Status: AC
  Administered 2017-06-17: 2 mg

## 2017-06-17 MED ORDER — POTASSIUM CHLORIDE 20 MEQ/15ML (10%) PO SOLN
20.0000 meq | Freq: Every day | ORAL | Status: DC
  Administered 2017-06-17 – 2017-06-18 (×2): 20 meq via ORAL
  Filled 2017-06-17 (×2): qty 15

## 2017-06-17 MED ORDER — MAGNESIUM SULFATE 2 GM/50ML IV SOLN
2.0000 g | Freq: Once | INTRAVENOUS | Status: AC
  Administered 2017-06-17: 2 g via INTRAVENOUS
  Filled 2017-06-17: qty 50

## 2017-06-17 NOTE — Progress Notes (Signed)
PROGRESS NOTE    Justin Ball  ZOX:[id number] DOB: 30-Aug-1991 DOA: 05/20/2017 PCP: Randall Hiss, MD  Outpatient Specialists:     Brief Narrative:  26 y/o ? HIV/AIDS diag 2013-high risk sexual behaviour  Documented virological failure on Tivicay/Truvada  anal intraepithelial neoplasia III s/p surgery Dr. Michaell Cowing 12/28/14  Rectal abscesses/submandibular abscesses Syphilis Chr Hep B Bipolar affecting care documented extensive medical noncompliance  Originally seen [facility] ED 5/25 headache given headache cocktail Return to ED unresponsive, periodic seizures on 5/28  White count 7.1, lactic acidosis 6.6 and started on empiric vancomycin, ceftriaxone, acyclovir Had LP 5/27-pleocytosis + yeast on CSF- crytpo in CSF, Strep in blood  2/2 ICH and blown pupil, NS placed ventriculostomy for elevated ICP Extubated 05/26/17  Patient has subsequently improved after VP shunt placement He is currently finishing courses of antibiotics as directed per infectious disease Disposition will be an issue-he is not completely oriented as yet and still is requiring tube feeds for malnutrition    Assessment & Plan:   Principal Problem:   Seizure (HCC) Active Problems:   HIV (human immunodeficiency virus infection) (HCC)   Rash and nonspecific skin eruption   Insomnia   Bipolar disorder (HCC)   Intersphincteric abscess   AIDS (HCC)   Headache   Meningoencephalitis   Respiratory failure (HCC)   Encephalopathy acute   Patient has nasogastric tube   History of syphilis   Tobacco abuse   Tachypnea   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 Rolanda Jay 6/19 - S/p ventriculostomy 5/28-6/8.  - Continue amphotericin per ID - linezolid as per ID until 06/14/2017, flucytosine d/c 6/23 - Continue keppra per neurology for seizure Tx, change to by mouth formulation 6/22 - Bactrim changed to 3 times weekly  6/23 - EVD removed 6/8  - LP repeated 6/12 with opening pressure - LP repeated 6/15 with opening pressure Patient has made some improvements in mentation, coordination, cognition Speech therapy recommended regular diet with thin liquids D/c d5/ns 50 cc/h 6/23, can keep minimal vol for L sided PICC Would keep Core-trak in place for nutrition as the patient is only eating minimally  -space out norco to q12, patient not in pain -space out ativan to q 6 -Discontinued flexiseal 6/23  Dysphagia secondary to acute metabolic encephalopathy  Much better  Appreciate speech therapy  cognitive eval   Eating ~ 25--40 % only of meals--hold d/c of tube feed  Acute encephalopathy:   meningoencephalitis, seizures, ischemic infarcts (no antiplatelet per neuro), metabolic derangements and  medications; likely complicated by delirium.   Numerous scattered areas of ischemia on MRI   Ativan prn agitation, off precedex since 6/8, avoid haldol with seizure disorder   Cut back Norco to q 8 prn severe pain/discontinued Benadryl as per Amphotericin B protocol 6/20  Endorse sleep-wake cycle, shades up during day, OOB meals etc  Streptococcus pneumoniae bacteremia - Linezolid per ID x14 days (6/6 - last dose on 6/21) - TTE ordered: No vegetations noted   Bordatella pneumonia - Was treated with doxycycline per ID, now off   AIDS: CD4 count April 2018 was 2.  - Hold ART per ID - Continue bactrim daily--->3 x /wk and weekly azithromycin ppx  Neutropenia: Suspected due to flucytosine.  - Monitor as trended up word with discontinuation of some antibiotics ANC is currently 1500,   Diarrhea: Acute, likely from tube feeds.  - Flexiseal d/c 6/23 - Anti-diarrheals lomotil for 4 doses and  re-assess needs for flexiseal  Chronic hepatitis B - Follow up with ID    scd Inpatient pending resolution No family present today Patient's plan has been updated to include skilled nursing care  facility vs CIR  Consultants:    Multiple  Procedures:    multiple  Antimicrobials:   See notes    Subjective:  Seen with grandma the bedside Patient much more oriented Asking for mittens to be removed however sister states that he constantly pulls at tubes and lines Nursing informs that patient having sensation to void and may pull Foley catheter later today as per protocol He doesn't have any other real complaint   Objective: Vitals:   06/17/17 0406 06/17/17 0739 06/17/17 1121 06/17/17 1557  BP: 111/83  100/68 109/72  Pulse: (!) 112  100   Resp: 16  19   Temp: 98 F (36.7 C) 97.7 F (36.5 C) 98.2 F (36.8 C) 97.4 F (36.3 C)  TempSrc: Oral Oral Oral Axillary  SpO2: 100%  97% 99%  Weight: 63.8 kg (140 lb 10.5 oz)     Height:        Intake/Output Summary (Last 24 hours) at 06/17/17 1634 Last data filed at 06/17/17 1600  Gross per 24 hour  Intake           2077.5 ml  Output             3950 ml  Net          -1872.5 ml   Filed Weights   06/15/17 0340 06/16/17 0319 06/17/17 0406  Weight: 66.2 kg (145 lb 15.1 oz) 59.9 kg (132 lb 0.9 oz) 63.8 kg (140 lb 10.5 oz)    Examination:   Awake alert , staples visualized right hemicranium  perrla, no icterus no pallor   out of restraints however still has mittens on  sitter at bedside NG tube in placehas Foley catheter still flexes been removed s1S2 no murmur cta B  Data Reviewed: I have personally reviewed following labs and imaging studies  CBC:  Recent Labs Lab 06/13/17 0306 06/14/17 0345 06/15/17 0709 06/16/17 0423 06/17/17 0530  WBC 2.7* 3.4* 1.9* 1.8* 3.1*  NEUTROABS 1.5* 2.0 1.0* 0.8* 1.5*  HGB 9.5* 9.8* 9.5* 9.6* 10.1*  HCT 29.0* 29.7* 29.2* 29.2* 30.0*  MCV 89.2 88.1 89.8 89.6 88.0  PLT 258 278 290 274 280   Basic Metabolic Panel:  Recent Labs Lab 06/12/17 0352 06/13/17 0306 06/14/17 0345 06/15/17 0709 06/16/17 0423 06/16/17 0447 06/17/17 0530  NA 134* 135 134* 139 139 138 138  K  3.9 4.3 4.3 3.9 3.7 3.7 4.8  CL 104 106 105 107 109 109 109  CO2 23 23 24 25  20* 23 23  GLUCOSE 103* 91 99 80 90 94 85  BUN 12 7 6 7 8 6 7   CREATININE 0.64 0.81 0.75 0.69 0.79 0.75 0.91  CALCIUM 9.0 8.6* 8.9 9.0 9.1 9.2 9.3  MG 1.7 1.8 1.8 1.9 1.5*  --  1.7  PHOS 6.1* 6.5*  --  6.2*  --  6.2* 7.0*   GFR: Estimated Creatinine Clearance: 111 mL/min (by C-G formula based on SCr of 0.91 mg/dL). Liver Function Tests:  Recent Labs Lab 06/13/17 1030 06/14/17 0345 06/15/17 0709 06/16/17 0423 06/16/17 0447 06/17/17 0530  AST 65* 61*  --  68*  --   --   ALT 69* 66*  --  72*  --   --   ALKPHOS 134* 151*  --  142*  --   --  BILITOT 0.2* 0.3  --  0.4  --   --   PROT 7.4 8.0  --  7.6  --   --   ALBUMIN 2.5* 2.8* 2.6* 2.8* 2.7* 2.9*   No results for input(s): LIPASE, AMYLASE in the last 168 hours. No results for input(s): AMMONIA in the last 168 hours. Coagulation Profile:  Recent Labs Lab 06/16/17 0423  INR 0.92   Cardiac Enzymes: No results for input(s): CKTOTAL, CKMB, CKMBINDEX, TROPONINI in the last 168 hours. BNP (last 3 results) No results for input(s): PROBNP in the last 8760 hours. HbA1C: No results for input(s): HGBA1C in the last 72 hours. CBG:  Recent Labs Lab 06/16/17 2354 06/17/17 0401 06/17/17 0734 06/17/17 1119 06/17/17 1556  GLUCAP 72 92 78 92 94   Lipid Profile: No results for input(s): CHOL, HDL, LDLCALC, TRIG, CHOLHDL, LDLDIRECT in the last 72 hours. Thyroid Function Tests: No results for input(s): TSH, T4TOTAL, FREET4, T3FREE, THYROIDAB in the last 72 hours. Anemia Panel: No results for input(s): VITAMINB12, FOLATE, FERRITIN, TIBC, IRON, RETICCTPCT in the last 72 hours. Urine analysis:    Component Value Date/Time   COLORURINE YELLOW 05/20/2017 1742   APPEARANCEUR CLEAR 05/20/2017 1742   LABSPEC 1.020 05/20/2017 1742   PHURINE 7.0 05/20/2017 1742   GLUCOSEU NEGATIVE 05/20/2017 1742   HGBUR NEGATIVE 05/20/2017 1742   BILIRUBINUR NEGATIVE  05/20/2017 1742   KETONESUR NEGATIVE 05/20/2017 1742   PROTEINUR NEGATIVE 05/20/2017 1742   UROBILINOGEN 4.0 (H) 09/09/2014 1055   NITRITE NEGATIVE 05/20/2017 1742   LEUKOCYTESUR NEGATIVE 05/20/2017 1742   Sepsis Labs: @LABRCNTIP (procalcitonin:4,lacticidven:4)  ) Recent Results (from the past 240 hour(s))  CSF culture with Stat gram stain     Status: None   Collection Time: 06/08/17  1:28 PM  Result Value Ref Range Status   Specimen Description CSF  Final   Special Requests Immunocompromised TUBE 2  Final   Gram Stain   Final    WBC PRESENT, PREDOMINANTLY MONONUCLEAR CYTOSPIN SMEAR CORRECTED RESULTS BUDDING YEAST SEEN PREVIOUSLY REPORTED AS: NO ORGANISMS SEEN CORRECTED RESULTS CALLED TO: RN Patsey Berthold [id number] MLM    Culture NO GROWTH 7 DAYS  Final   Report Status 06/15/2017 FINAL  Final  Culture, fungus without smear     Status: None (Preliminary result)   Collection Time: 06/08/17  1:28 PM  Result Value Ref Range Status   Specimen Description CSF  Final   Special Requests Immunocompromised TUBE 2  Final   Culture NO FUNGUS ISOLATED AFTER 7 DAYS  Final   Report Status PENDING  Incomplete     Radiology Studies: No results found. Scheduled Meds: . acetaminophen  650 mg Oral Q24H  . azithromycin  1,200 mg Per Tube Weekly  . chlorhexidine  15 mL Mouth Rinse BID  . Chlorhexidine Gluconate Cloth  6 each Topical Daily  . free water  200 mL Per Tube Q8H  . insulin aspart  2-6 Units Subcutaneous Q4H  . levETIRAcetam  1,000 mg Oral BID  . lidocaine (PF)  10 mL Intradermal Once  . mouth rinse  15 mL Mouth Rinse q12n4p  . metoprolol tartrate  5 mg Intravenous Q8H  . potassium chloride  20 mEq Oral Daily  . [START ON 06/18/2017] sulfamethoxazole-trimethoprim  10 mL Per Tube Once per day on Mon Wed Fri  . tamsulosin  0.4 mg Oral Daily   Continuous Infusions: . sodium chloride Stopped (06/05/17 0200)  . amphotericin  B  Liposome (AMBISOME) ADULT IV Stopped (06/16/17 2230)   .  dextrose 5 % and 0.45% NaCl    . feeding supplement (VITAL AF 1.2 CAL) Stopped (06/14/17 1638)  . sodium chloride 1,000 mL (06/07/17 2200)  . sodium chloride       LOS: 28 days    Time spent: 12    Pleas Koch, MD [facility]   If 7PM-7AM, please contact night-coverage [email or web address] Password [facility] 06/17/2017, 4:34 PM

## 2017-06-18 DIAGNOSIS — E43 Unspecified severe protein-calorie malnutrition: Secondary | ICD-10-CM

## 2017-06-18 LAB — GLUCOSE, CAPILLARY
GLUCOSE-CAPILLARY: 85 mg/dL (ref 65–99)
GLUCOSE-CAPILLARY: 90 mg/dL (ref 65–99)
GLUCOSE-CAPILLARY: 100 mg/dL — AB (ref 65–99)
Glucose-Capillary: 90 mg/dL (ref 65–99)

## 2017-06-18 LAB — CBC WITH DIFFERENTIAL/PLATELET
BASOS ABS: 0 10*3/uL (ref 0.0–0.1)
Basophils Relative: 2 %
EOS PCT: 2 %
Eosinophils Absolute: 0.1 10*3/uL (ref 0.0–0.7)
HCT: 29.4 % — ABNORMAL LOW (ref 39.0–52.0)
HEMOGLOBIN: 9.7 g/dL — AB (ref 13.0–17.0)
LYMPHS PCT: 33 %
Lymphs Abs: 0.7 10*3/uL (ref 0.7–4.0)
MCH: 28.7 pg (ref 26.0–34.0)
MCHC: 33 g/dL (ref 30.0–36.0)
MCV: 87 fL (ref 78.0–100.0)
Monocytes Absolute: 0.3 10*3/uL (ref 0.1–1.0)
Monocytes Relative: 12 %
NEUTROS ABS: 1.1 10*3/uL — AB (ref 1.7–7.7)
NEUTROS PCT: 51 %
PLATELETS: 285 10*3/uL (ref 150–400)
RBC: 3.38 MIL/uL — AB (ref 4.22–5.81)
RDW: 13.6 % (ref 11.5–15.5)
WBC: 2.1 10*3/uL — AB (ref 4.0–10.5)

## 2017-06-18 LAB — RENAL FUNCTION PANEL
ANION GAP: 4 — AB (ref 5–15)
Albumin: 2.7 g/dL — ABNORMAL LOW (ref 3.5–5.0)
BUN: 7 mg/dL (ref 6–20)
CALCIUM: 8.7 mg/dL — AB (ref 8.9–10.3)
CHLORIDE: 109 mmol/L (ref 101–111)
CO2: 24 mmol/L (ref 22–32)
CREATININE: 0.81 mg/dL (ref 0.61–1.24)
Glucose, Bld: 86 mg/dL (ref 65–99)
Phosphorus: 6.8 mg/dL — ABNORMAL HIGH (ref 2.5–4.6)
Potassium: 3.9 mmol/L (ref 3.5–5.1)
SODIUM: 137 mmol/L (ref 135–145)

## 2017-06-18 LAB — MAGNESIUM: MAGNESIUM: 1.9 mg/dL (ref 1.7–2.4)

## 2017-06-18 MED ORDER — POTASSIUM CHLORIDE 20 MEQ/15ML (10%) PO SOLN
20.0000 meq | Freq: Two times a day (BID) | ORAL | Status: DC
  Administered 2017-06-18 – 2017-06-20 (×4): 20 meq via ORAL
  Filled 2017-06-18 (×4): qty 15

## 2017-06-18 MED ORDER — INSULIN ASPART 100 UNIT/ML ~~LOC~~ SOLN: 2.0000 [IU] | Freq: Three times a day (TID) | SUBCUTANEOUS | Status: DC

## 2017-06-18 MED ORDER — MAGNESIUM SULFATE 2 GM/50ML IV SOLN
2.0000 g | Freq: Once | INTRAVENOUS | Status: AC
  Administered 2017-06-18: 2 g via INTRAVENOUS
  Filled 2017-06-18: qty 50

## 2017-06-18 MED ORDER — BUSPIRONE HCL 10 MG PO TABS
5.0000 mg | ORAL_TABLET | Freq: Two times a day (BID) | ORAL | Status: DC
  Administered 2017-06-18 – 2017-06-22 (×9): 5 mg via ORAL
  Filled 2017-06-18 (×9): qty 1

## 2017-06-18 NOTE — Progress Notes (Signed)
Pharmacy Consult Note:  Electrolyte Replacement  26 yom currently on treatment for cryptococcal meningitis and strep pneumo bacteremia. Pharmacy consulted by ID to manage electrolyte replacement.  K is down to 3.9 after his potassium supplement was decreased 6/24, Mag up to 1.9 but is still below goal of >2 and he requires almost daily supplementation.  Plan: Increase potassium chloride to 20mEq via tube BID Give Mg 2g IV x 1 dose today  Thank you for allowing pharmacy to be a part of this patient's care.  Estella HuskMichelle Hernando Reali, Pharm.D., BCPS, AAHIVP Clinical Pharmacist Phone: [phone] or [phone] 06/18/2017, 10:42 AM

## 2017-06-18 NOTE — Progress Notes (Signed)
Pt had little output since straight cath was done early this AM. Bladder scan showed 753 mL. Dr. Mahala MenghiniSamtani notified. Updated of pt condition. New order received.

## 2017-06-18 NOTE — Clinical Social Work Note (Signed)
Patient's PASARR under Level II review.  Charlynn CourtSarah Scarlet Abad, CSW [phone]

## 2017-06-18 NOTE — Progress Notes (Signed)
[facility]    Date of Admission:  05/20/2017   Total days of antibiotics 30        Day 30L-ampho                Day 30 oi proph   ID: Justin Ball is a 26 y.o. 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  Principal Problem:   Seizure (HCC) Active Problems:   HIV (human immunodeficiency virus infection) (HCC)   Rash and nonspecific skin eruption   Insomnia   Bipolar disorder (HCC)   Intersphincteric abscess   AIDS (HCC)   Headache   Meningoencephalitis   Respiratory failure (HCC)   Encephalopathy acute   Patient has nasogastric tube   History of syphilis   Tobacco abuse   Tachypnea    Subjective: Since having a sitter, patient has been more cooperative. He ate 50% of breakfast and 75% of lunch with aid of sitter. He notes water in his nose when dobhoff is flushed, ? leak  Labs: fungal cx since 6/15 NGTD at day 10  Medications:  . acetaminophen  650 mg Oral Q24H  . azithromycin  1,200 mg Per Tube Weekly  . busPIRone  5 mg Oral BID  . chlorhexidine  15 mL Mouth Rinse BID  . Chlorhexidine Gluconate Cloth  6 each Topical Daily  . free water  200 mL Per Tube Q8H  . insulin aspart  2-6 Units Subcutaneous Q8H  . levETIRAcetam  1,000 mg Oral BID  . lidocaine (PF)  10 mL Intradermal Once  . mouth rinse  15 mL Mouth Rinse q12n4p  . metoprolol tartrate  5 mg Intravenous Q8H  . potassium chloride  20 mEq Oral BID  . sulfamethoxazole-trimethoprim  10 mL Per Tube Once per day on Mon Wed Fri  . tamsulosin  0.4 mg Oral Daily    Objective: Vital signs in last 24 hours: Temp:  [97.5 F (36.4 C)-98.5 F (36.9 C)] 98.3 F (36.8 C) (06/25 1600) Pulse Rate:  [74-103] 103 (06/25 1119) Resp:  [13-19] 19 (06/25 1119) BP: (90-102)/(56-75) 96/56 (06/25 1119) SpO2:  [97 %-100 %] 97 % (06/25 1119) Weight:  [122 lb 9.2 oz (55.6 kg)] 122 lb 9.2 oz (55.6 kg) (06/25 0500) Physical Exam  Constitutional: He is oriented to person,only. He appears  chronically ill. No distress. Bandaged covering his VP shunt scalp incision HENT: bridled dobhoff tube in place Mouth/Throat: Oropharynx is clear and moist. No oropharyngeal exudate.  Cardiovascular: Normal rate, regular rhythm and normal heart sounds. Exam reveals no gallop and no friction rub.  No murmur heard.  Pulmonary/Chest: Effort normal and breath sounds normal. No respiratory distress. He has no wheezes.  Abdominal: Soft. Bowel sounds are normal. He exhibits no distension. There is no tenderness.  Neurological: He is alert and oriented to self.he remembered physician from yesterday. Still cognitive slowing Skin: Slight erythema to staples on chest Psychiatric: He has a normal mood and affect. His behavior is normal.     Lab Results  Recent Labs  06/17/17 0530 06/18/17 0304  WBC 3.1* 2.1*  HGB 10.1* 9.7*  HCT 30.0* 29.4*  NA 138 137  K 4.8 3.9  CL 109 109  CO2 23 24  BUN 7 7  CREATININE 0.91 0.81   Liver Panel  Recent Labs  06/16/17 0423  06/17/17 0530 06/18/17 0304  PROT 7.6  --   --   --   ALBUMIN 2.8*  < >  2.9* 2.7*  AST 68*  --   --   --   ALT 72*  --   --   --   ALKPHOS 142*  --   --   --   BILITOT 0.4  --   --   --   < > = values in this interval not displayed.  Microbiology: 6/15 csf - yeast on gram stain but culture aerobic negative at 7 days,(which is now discarded) ,but fungal cx is negative at 7 days (Which is held for 28d) 6/5 csf - staph epi Studies/Results: No results found.   Assessment/Plan: 26yo M with untreated hiv disease admitted for seizure/loss of consciousness concerning for uncal herniation due to CM s/p EVD now converted to VP shunt  Cryptococcal meningitis = last day of ampho. Flucytosine stopped over the weekend due to leukopenia. plan to switch to fluconazole on Tuesday  HIV disease= will plan to start ART on thursdsay  oi proph = continue with daily bactrim plus weekly azithromycin  Leukopenia = stable. May improve as  flucytosine out of the system  Severe protein calorie malnutrition = continue to supplement food and have sitter help with feeding. Await to see what his sodium looks like once we stop ampho.  Mental status = though it waxes and wanes, it appears improved with having a sitter and less sedation.  Dr Orvan Falconercampbell available over the weekend  [facility], [facility] Cell: [phone] Pager: [phone]  06/18/2017, 6:00 PM

## 2017-06-18 NOTE — Progress Notes (Signed)
Physical Therapy Treatment Patient Details Name: Justin Ball MRN: [id number] DOB: [birth year] Today's Date: 06/18/2017    History of Present Illness Pt is a 26 year old man admitted s/p seizure activity having several days of recent headaches. 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 EVD was placed after acute decompensation on 5/28 with decorticate posturing and asymmetric pupils. 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 PMH includes: syphilis, substance abuse, soft palate ulceration, scabies, neuromuscular disorder, HIV/AIDS, depression, bipolar disorder, anxiety    PT Comments    Patient continues to progress very well toward mobility goals. Pt ambulated with +2 mod/max assist for 31ft this session and is following commands/responding appropriately more so than previous session. Pt was very conversant and wants to go to [employer]. Friends and sitter present during session. Continue to progress as tolerated.   Follow Up Recommendations  CIR     Equipment Recommendations  Wheelchair (measurements PT);Wheelchair cushion (measurements PT);Hospital bed    Recommendations for Other Services Rehab consult     Precautions / Restrictions Precautions Precautions: Fall Precaution Comments: NG tube    Mobility  Bed Mobility Overal bed mobility: Needs Assistance Bed Mobility: Supine to Sit;Sit to Supine     Supine to sit: Mod assist;HOB elevated Sit to supine: Min assist   General bed mobility comments: assist to bring bilat LE off EOB and to elevate trunk with cues for sequencing; assist to bring single LE into bed  Transfers Overall transfer level: Needs assistance Equipment used: Rolling walker (2 wheeled);2 person hand held assist Transfers: Sit to/from Stand Sit to Stand: Min assist;Mod assist;+2 safety/equipment         General transfer comment: assist to power up into standing and for  balance upon standing  Ambulation/Gait Ambulation/Gait assistance: Mod assist;Max assist;+2 physical assistance Ambulation Distance (Feet): 60 Feet Assistive device:  (3 musketeers) Gait Pattern/deviations: Step-to pattern;Decreased step length - right;Narrow base of support;Step-through pattern;Decreased stance time - left;Scissoring     General Gait Details: multimodal cues for sequencing and step length and positioning of bilat feet for wider BOS with facilitation at glutes for advancement of bilat LE; +2 assist for balance; scissoring at times   Stairs            Wheelchair Mobility    Modified Rankin (Stroke Patients Only)       Balance Overall balance assessment: Needs assistance Sitting-balance support: Feet supported Sitting balance-Leahy Scale: Fair     Standing balance support: Bilateral upper extremity supported;During functional activity Standing balance-Leahy Scale: Poor                              Cognition Arousal/Alertness: Awake/alert Behavior During Therapy: Flat affect Overall Cognitive Status: Impaired/Different from baseline Area of Impairment: Orientation;Attention;Memory;Following commands;Safety/judgement;Awareness;Problem solving                 Orientation Level: Disoriented to;Place;Time;Situation Current Attention Level: Sustained Memory: Decreased short-term memory Following Commands: Follows one step commands consistently;Follows one step commands with increased time Safety/Judgement: Decreased awareness of safety;Decreased awareness of deficits Awareness: Intellectual Problem Solving: Slow processing;Decreased initiation;Difficulty sequencing;Requires verbal cues General Comments: pt is following commands much more consistently       Exercises      General Comments General comments (skin integrity, edema, etc.): friends and sitter present throughout session      Pertinent Vitals/Pain Pain Assessment:  No/denies pain    Home Living  Prior Function            PT Goals (current goals can now be found in the care plan section) Acute Rehab PT Goals Patient Stated Goal: go to [employer] Progress towards PT goals: Progressing toward goals    Frequency    Min 3X/week      PT Plan Current plan remains appropriate;Frequency needs to be updated    Co-evaluation              AM-PAC PT "6 Clicks" Daily Activity  Outcome Measure  Difficulty turning over in bed (including adjusting bedclothes, sheets and blankets)?: A Little Difficulty moving from lying on back to sitting on the side of the bed? : Total Difficulty sitting down on and standing up from a chair with arms (e.g., wheelchair, bedside commode, etc,.)?: Total Help needed moving to and from a bed to chair (including a wheelchair)?: A Lot Help needed walking in hospital room?: A Lot Help needed climbing 3-5 steps with a railing? : A Lot 6 Click Score: 11    End of Session Equipment Utilized During Treatment: Gait Ball Activity Tolerance: Patient tolerated treatment well Patient left: with call bell/phone within reach;with nursing/sitter in room;Other (comment);in bed;with restraints reapplied (sitter present; mittens on) Nurse Communication: Mobility status PT Visit Diagnosis: Other symptoms and signs involving the nervous system (R29.898);Muscle weakness (generalized) (M62.81);Difficulty in walking, not elsewhere classified (R26.2)     Time: [id number] PT Time Calculation (min) (ACUTE ONLY): 29 min  Charges:  $Gait Training: 8-22 mins $Therapeutic Activity: 8-22 mins                    G Codes:       Justin Ball, PTA Pager: [phone]     Justin Ball 06/18/2017, 4:44 PM

## 2017-06-18 NOTE — Progress Notes (Addendum)
Patient lost safety sitter due to staffing needs. MD notified and ordered soft belt restraint but restraint not applied. PRN tech has agreed to sit with patient at 11. Restraint order discontinued.  Bed at lowest setting, fall mats placed at bedside, and bed alarm has been turned on. Will continue to monitor patient.

## 2017-06-18 NOTE — Progress Notes (Signed)
PROGRESS NOTE    Justin Ball  ZOX:[id number] DOB: [birth year] DOA: 05/20/2017 PCP: Randall Hiss, MD  Outpatient Specialists:     Brief Narrative:  26 y/o ? HIV/AIDS diag 2013-high risk sexual behaviour  Documented virological failure on Tivicay/Truvada  anal intraepithelial neoplasia III s/p surgery Dr. Michaell Cowing 12/28/14  Rectal abscesses/submandibular abscesses Syphilis Chr Hep B Bipolar affecting care documented extensive medical noncompliance  Originally seen [facility] ED 5/25 headache given headache cocktail Return to ED unresponsive, periodic seizures on 5/28  White count 7.1, lactic acidosis 6.6 and started on empiric vancomycin, ceftriaxone, acyclovir Had LP 5/27-pleocytosis + yeast on CSF- crytpo in CSF, Strep in blood  2/2 ICH and blown pupil, NS placed ventriculostomy for elevated ICP Extubated 05/26/17  Patient has subsequently improved after VP shunt placement He is currently finishing courses of antibiotics as directed per infectious disease Disposition will be an issue-he is not completely oriented as yet and still is requiring tube feeds for malnutrition    Assessment & Plan:   Principal Problem:   Seizure (HCC) Active Problems:   HIV (human immunodeficiency virus infection) (HCC)   Rash and nonspecific skin eruption   Insomnia   Bipolar disorder (HCC)   Intersphincteric abscess   AIDS (HCC)   Headache   Meningoencephalitis   Respiratory failure (HCC)   Encephalopathy acute   Patient has nasogastric tube   History of syphilis   Tobacco abuse   Tachypnea   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 Rolanda Jay 6/19 - S/p ventriculostomy 5/28-6/8.  - Continue amphotericin per ID - linezolid as per ID until 06/14/2017, flucytosine d/c 6/23 - Continue keppra per neurology for seizure Tx, change to by mouth formulation 6/22 - Bactrim changed to 3 times weekly  6/23 - EVD removed 6/8  - LP repeated 6/12 with opening pressure - LP repeated 6/15 with opening pressure -6.25.18 discontinued norco to q12, patient not in pain -6.25.18 discontinued ativan q 6, started BuSpar -Discontinued flexiseal 6/23  Dysphagia secondary to acute metabolic encephalopathy  better  Appreciate speech therapy cognitive eval   Eating ~ 25--40 % only of meals--hold d/c of tube feed Patient has made some improvements in mentation, coordination, cognition Speech therapy recommended regular diet with thin liquids  Still not quite eating 50% regularly, might need parenteral nutrition via core Trak for a while  D/c d5/ns 50 cc/h 6/23, can keep minimal vol for L sided PICC  Acute encephalopathy:   meningoencephalitis, seizures, ischemic infarcts (no antiplatelet per neuro), metabolic derangements and  medications; likely complicated by delirium.   Numerous scattered areas of ischemia on MRI   Ativan prn d/c as above off precedex since 6/8, avoid haldol with seizure disorder   Cut back Norco to q 8 prn and discontinued as above severe pain/discontinued Benadryl as per Amphotericin B protocol 6/20  Endorse sleep-wake cycle, shades up during day, OOB meals etc  Streptococcus pneumoniae bacteremia - Linezolid per ID x14 days (6/6 - last dose on 6/21) - TTE ordered: No vegetations noted   Bordatella pneumonia - Was treated with doxycycline per ID, now off   AIDS: CD4 count April 2018 was 2.  - Hold ART per ID - Continue bactrim daily--->3 x /wk and weekly azithromycin ppx  Neutropenia, is afebrile Suspected due to flucytosine.  - Monitor as trended up word with discontinuation of some antibiotics ANC is currently 1100  Diarrhea:  Acute, likely from tube feeds.  -  Flexiseal d/c 6/23 - Anti-diarrheals lomotil was discontinued  Chronic hepatitis B - Follow up with ID  Malnutrition  Has lost 11 pounds, will ask for calorie count from nutrition in  addition to further recommendations  Prefer to discontinue enteral feeds however will probably need them at least for another week or week and a half to buildup protein stores  Get prealbumin in a.m. with labs  scd Inpatient pending resolution No family present today Patient's plan has been updated to CIR, we'll await further input from infectious disease and anticipate slow recovery of mental status over the next couple of weeks, insurances to be followed for eligibility to CIR  Consultants:    Multiple  Procedures:    multiple  Antimicrobials:   See notes    Subjective:   doing fair today Had to be catheterized again because of urinary retention and we will try to bladder train him again tomorrow 6/26 ate about 75% of breakfast Otherwise fair-less verbal today that he was yesterday  Objective: Vitals:   06/18/17 0500 06/18/17 0600 06/18/17 0712 06/18/17 1119  BP:  94/66 102/66 (!) 96/56  Pulse:  77 95 (!) 103  Resp:  14 17 19   Temp:  97.5 F (36.4 C) 97.9 F (36.6 C) 98.5 F (36.9 C)  TempSrc:  Axillary Oral Oral  SpO2:  100% 100% 97%  Weight: 55.6 kg (122 lb 9.2 oz)     Height:        Intake/Output Summary (Last 24 hours) at 06/18/17 1343 Last data filed at 06/18/17 1305  Gross per 24 hour  Intake           2320.5 ml  Output             5251 ml  Net          -2930.5 ml   Filed Weights   06/16/17 0319 06/17/17 0406 06/18/17 0500  Weight: 59.9 kg (132 lb 0.9 oz) 63.8 kg (140 lb 10.5 oz) 55.6 kg (122 lb 9.2 oz)    Examination:   Awake alert , staples visualized right hemicranium  perrla, no icterus no pallor   out of restraints -still has mittens on   sitter at bedside NG tube in place has Foley catheter still  s1S2 no murmur cta B  Data Reviewed: I have personally reviewed following labs and imaging studies  CBC:  Recent Labs Lab 06/14/17 0345 06/15/17 0709 06/16/17 0423 06/17/17 0530 06/18/17 0304  WBC 3.4* 1.9* 1.8* 3.1* 2.1*    NEUTROABS 2.0 1.0* 0.8* 1.5* 1.1*  HGB 9.8* 9.5* 9.6* 10.1* 9.7*  HCT 29.7* 29.2* 29.2* 30.0* 29.4*  MCV 88.1 89.8 89.6 88.0 87.0  PLT 278 290 274 280 285   Basic Metabolic Panel:  Recent Labs Lab 06/13/17 0306 06/14/17 0345 06/15/17 0709 06/16/17 0423 06/16/17 0447 06/17/17 0530 06/18/17 0304  NA 135 134* 139 139 138 138 137  K 4.3 4.3 3.9 3.7 3.7 4.8 3.9  CL 106 105 107 109 109 109 109  CO2 23 24 25  20* 23 23 24   GLUCOSE 91 99 80 90 94 85 86  BUN 7 6 7 8 6 7 7   CREATININE 0.81 0.75 0.69 0.79 0.75 0.91 0.81  CALCIUM 8.6* 8.9 9.0 9.1 9.2 9.3 8.7*  MG 1.8 1.8 1.9 1.5*  --  1.7 1.9  PHOS 6.5*  --  6.2*  --  6.2* 7.0* 6.8*   GFR: Estimated Creatinine Clearance: 108.7 mL/min (by C-G formula based on SCr of  0.81 mg/dL). Liver Function Tests:  Recent Labs Lab 06/13/17 1030 06/14/17 0345 06/15/17 0709 06/16/17 0423 06/16/17 0447 06/17/17 0530 06/18/17 0304  AST 65* 61*  --  68*  --   --   --   ALT 69* 66*  --  72*  --   --   --   ALKPHOS 134* 151*  --  142*  --   --   --   BILITOT 0.2* 0.3  --  0.4  --   --   --   PROT 7.4 8.0  --  7.6  --   --   --   ALBUMIN 2.5* 2.8* 2.6* 2.8* 2.7* 2.9* 2.7*   No results for input(s): LIPASE, AMYLASE in the last 168 hours. No results for input(s): AMMONIA in the last 168 hours. Coagulation Profile:  Recent Labs Lab 06/16/17 0423  INR 0.92   Cardiac Enzymes: No results for input(s): CKTOTAL, CKMB, CKMBINDEX, TROPONINI in the last 168 hours. BNP (last 3 results) No results for input(s): PROBNP in the last 8760 hours. HbA1C: No results for input(s): HGBA1C in the last 72 hours. CBG:  Recent Labs Lab 06/17/17 2107 06/17/17 2355 06/18/17 0349 06/18/17 0718 06/18/17 1112  GLUCAP 108* 87 85 90 90   Lipid Profile: No results for input(s): CHOL, HDL, LDLCALC, TRIG, CHOLHDL, LDLDIRECT in the last 72 hours. Thyroid Function Tests: No results for input(s): TSH, T4TOTAL, FREET4, T3FREE, THYROIDAB in the last 72  hours. Anemia Panel: No results for input(s): VITAMINB12, FOLATE, FERRITIN, TIBC, IRON, RETICCTPCT in the last 72 hours. Urine analysis:    Component Value Date/Time   COLORURINE YELLOW 05/20/2017 1742   APPEARANCEUR CLEAR 05/20/2017 1742   LABSPEC 1.020 05/20/2017 1742   PHURINE 7.0 05/20/2017 1742   GLUCOSEU NEGATIVE 05/20/2017 1742   HGBUR NEGATIVE 05/20/2017 1742   BILIRUBINUR NEGATIVE 05/20/2017 1742   KETONESUR NEGATIVE 05/20/2017 1742   PROTEINUR NEGATIVE 05/20/2017 1742   UROBILINOGEN 4.0 (H) 09/09/2014 1055   NITRITE NEGATIVE 05/20/2017 1742   LEUKOCYTESUR NEGATIVE 05/20/2017 1742   Sepsis Labs: @LABRCNTIP (procalcitonin:4,lacticidven:4)  ) No results found for this or any previous visit (from the past 240 hour(s)).   Radiology Studies: No results found. Scheduled Meds: . acetaminophen  650 mg Oral Q24H  . azithromycin  1,200 mg Per Tube Weekly  . chlorhexidine  15 mL Mouth Rinse BID  . Chlorhexidine Gluconate Cloth  6 each Topical Daily  . free water  200 mL Per Tube Q8H  . insulin aspart  2-6 Units Subcutaneous Q4H  . levETIRAcetam  1,000 mg Oral BID  . lidocaine (PF)  10 mL Intradermal Once  . mouth rinse  15 mL Mouth Rinse q12n4p  . metoprolol tartrate  5 mg Intravenous Q8H  . potassium chloride  20 mEq Oral BID  . sulfamethoxazole-trimethoprim  10 mL Per Tube Once per day on Mon Wed Fri  . tamsulosin  0.4 mg Oral Daily   Continuous Infusions: . sodium chloride Stopped (06/05/17 0200)  . amphotericin  B  Liposome (AMBISOME) ADULT IV Stopped (06/17/17 2143)  . dextrose 5 % and 0.45% NaCl 30 mL/hr at 06/17/17 2219  . feeding supplement (VITAL AF 1.2 CAL) Stopped (06/14/17 1638)  . sodium chloride 1,000 mL (06/07/17 2200)  . sodium chloride       LOS: 29 days    Time spent: 8515    Pleas KochJai Isatou Agredano, MD [facility](P) [phone]   If 7PM-7AM, please contact night-coverage [email or web address] Password [facility] 06/18/2017, 1:43 PM

## 2017-06-18 NOTE — Progress Notes (Signed)
I have spoken with Grandmother and Mom by phone today. They are unable to care for pt at home after a short inpt rehab stay. They are in agreement to SNF rehab. Pt is uninsured with Medicaid and disability applications initiated last week. Justin Ball, [employer] [facility], is meeting with grandmother today to complete ADAP paperwork to assist with obtaining funding for his HIV meds in the future. I have updated RN CM and SW. We will sign off at this time. I will update Dr. Mahala MenghiniSamtani. [phone]

## 2017-06-19 ENCOUNTER — Telehealth: Payer: Self-pay | Admitting: *Deleted

## 2017-06-19 LAB — CBC WITH DIFFERENTIAL/PLATELET
BASOS ABS: 0 10*3/uL (ref 0.0–0.1)
BASOS PCT: 1 %
EOS ABS: 0.1 10*3/uL (ref 0.0–0.7)
Eosinophils Relative: 3 %
HEMATOCRIT: 30.2 % — AB (ref 39.0–52.0)
Hemoglobin: 10.1 g/dL — ABNORMAL LOW (ref 13.0–17.0)
Lymphocytes Relative: 28 %
Lymphs Abs: 0.9 10*3/uL (ref 0.7–4.0)
MCH: 28.8 pg (ref 26.0–34.0)
MCHC: 33.4 g/dL (ref 30.0–36.0)
MCV: 86 fL (ref 78.0–100.0)
MONO ABS: 0.3 10*3/uL (ref 0.1–1.0)
MONOS PCT: 11 %
NEUTROS ABS: 1.8 10*3/uL (ref 1.7–7.7)
Neutrophils Relative %: 57 %
PLATELETS: 305 10*3/uL (ref 150–400)
RBC: 3.51 MIL/uL — ABNORMAL LOW (ref 4.22–5.81)
RDW: 13.6 % (ref 11.5–15.5)
WBC: 3.1 10*3/uL — ABNORMAL LOW (ref 4.0–10.5)

## 2017-06-19 LAB — GLUCOSE, CAPILLARY
GLUCOSE-CAPILLARY: 91 mg/dL (ref 65–99)
GLUCOSE-CAPILLARY: 114 mg/dL — AB (ref 65–99)
GLUCOSE-CAPILLARY: 111 mg/dL — AB (ref 65–99)

## 2017-06-19 LAB — PREALBUMIN: Prealbumin: 24.2 mg/dL (ref 18–38)

## 2017-06-19 LAB — COMPREHENSIVE METABOLIC PANEL
ALBUMIN: 2.9 g/dL — AB (ref 3.5–5.0)
ALT: 92 U/L — ABNORMAL HIGH (ref 17–63)
ANION GAP: 5 (ref 5–15)
AST: 96 U/L — AB (ref 15–41)
Alkaline Phosphatase: 169 U/L — ABNORMAL HIGH (ref 38–126)
BUN: 7 mg/dL (ref 6–20)
CHLORIDE: 109 mmol/L (ref 101–111)
CO2: 22 mmol/L (ref 22–32)
Calcium: 9 mg/dL (ref 8.9–10.3)
Creatinine, Ser: 0.9 mg/dL (ref 0.61–1.24)
GFR calc Af Amer: >60 mL/min (ref >60)
GFR calc non Af Amer: >60 mL/min (ref >60)
GLUCOSE: 93 mg/dL (ref 65–99)
POTASSIUM: 4.2 mmol/L (ref 3.5–5.1)
SODIUM: 136 mmol/L (ref 135–145)
TOTAL PROTEIN: 7.8 g/dL (ref 6.5–8.1)
Total Bilirubin: 0.5 mg/dL (ref 0.3–1.2)

## 2017-06-19 LAB — PHOSPHORUS: PHOSPHORUS: 5.5 mg/dL — AB (ref 2.5–4.6)

## 2017-06-19 LAB — MAGNESIUM: MAGNESIUM: 1.9 mg/dL (ref 1.7–2.4)

## 2017-06-19 MED ORDER — FLUCONAZOLE 100 MG PO TABS
400.0000 mg | ORAL_TABLET | Freq: Every day | ORAL | Status: DC
  Administered 2017-06-19 – 2017-06-27 (×9): 400 mg via ORAL
  Filled 2017-06-19 (×7): qty 4
  Filled 2017-06-19: qty 2
  Filled 2017-06-19: qty 4

## 2017-06-19 MED ORDER — MAGNESIUM SULFATE 4 GM/100ML IV SOLN
4.0000 g | Freq: Once | INTRAVENOUS | Status: AC
  Administered 2017-06-19: 4 g via INTRAVENOUS
  Filled 2017-06-19: qty 100

## 2017-06-19 MED ORDER — EMTRICITAB-RILPIVIR-TENOFOV AF 200-25-25 MG PO TABS: 1.0000 | ORAL_TABLET | Freq: Every day | ORAL | 6 refills | Status: DC

## 2017-06-19 MED ORDER — FREE WATER
200.0000 mL | Freq: Three times a day (TID) | Status: DC
  Administered 2017-06-19 – 2017-06-24 (×13): 200 mL via ORAL

## 2017-06-19 MED ORDER — ENSURE ENLIVE PO LIQD
237.0000 mL | Freq: Two times a day (BID) | ORAL | Status: DC
  Administered 2017-06-19 – 2017-06-20 (×3): 237 mL via ORAL

## 2017-06-19 MED ORDER — ENSURE ENLIVE PO LIQD: 237.0000 mL | Freq: Two times a day (BID) | ORAL | Status: DC

## 2017-06-19 NOTE — Progress Notes (Signed)
Pharmacy Consult Note:  Electrolyte Replacement  26 yom currently on treatment for cryptococcal meningitis and strep pneumo bacteremia. Pharmacy consulted by ID to manage electrolyte replacement. SCr remains stable.  K 4.2 on KCl 20meq bid Mg 1.9 << 1.9 s/p Mg 2g IV x 1 yesterday  Note plans to transition to Fluconazole today - will likely be ale to stop scheduled electrolyte repletion at that time.   Plan: Continue KCl 20 mEq bid Magnesium 4g IV x 1 dose today F/u AM lytes  Thank you for allowing pharmacy to be a part of this patient's care.  Georgina PillionElizabeth Gad Aymond, PharmD, BCPS Clinical Pharmacist Pager: [phone] Clinical phone for 06/19/2017 from 7a-3:30p: [phone] If after 3:30p, please call main pharmacy at: [phone] 06/19/2017 8:12 AM

## 2017-06-19 NOTE — Progress Notes (Signed)
PROGRESS NOTE    Justin Ball  Justin Ball:[id number] DOB: May 07, 1991 DOA: 05/20/2017 PCP: Randall Hiss, MD  Outpatient Specialists:     Brief Narrative:   26 y/o ? HIV/AIDS diag 2013-high risk sexual behaviour  Documented virological failure on Tivicay/Truvada  anal intraepithelial neoplasia III s/p surgery Dr. Michaell Cowing 12/28/14  Rectal abscesses/submandibular abscesses Syphilis Chr Hep B Bipolar affecting care documented extensive medical noncompliance  Originally seen [facility] ED 5/25 headache given headache cocktail Return to ED unresponsive, periodic seizures on 5/28  White count 7.1, lactic acidosis 6.6 and started on empiric vancomycin, ceftriaxone, acyclovir Had LP 5/27-pleocytosis + yeast on CSF- crytpo in CSF, Strep in blood  2/2 ICH and blown pupil, NS placed ventriculostomy for elevated ICP Extubated 05/26/17  Patient has subsequently improved after VP shunt placement He is currently finishing courses of antibiotics as directed per infectious disease Disposition will be an issue-currently [name] of Dept of mental health is reviewing him [phone]) is reviewing him for PASARR number    Assessment & Plan:   Principal Problem:   Seizure (HCC) Active Problems:   HIV (human immunodeficiency virus infection) (HCC)   Rash and nonspecific skin eruption   Insomnia   Bipolar disorder (HCC)   Intersphincteric abscess   AIDS (HCC)   Headache   Meningoencephalitis   Respiratory failure (HCC)   Encephalopathy acute   Patient has nasogastric tube   History of syphilis   Tobacco abuse   Tachypnea   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 Rolanda Jay 6/19 - S/p ventriculostomy 5/28-6/8.  - Continue amphotericin per ID-- Discontinue 6/25---fluconazole as per ID - linezolid as per ID until 06/14/2017, flucytosine d/c 6/23 - Continue keppra per neurology for seizure Tx, change  to by mouth formulation 6/22 - Bactrim changed to 3 times weekly 6/23 - EVD removed 6/8  - LP repeated 6/12 with opening pressure - LP repeated 6/15 with opening pressure -6.25.18 discontinued norco to q12, patient not in pain -6.25.18 discontinued ativan q 6, started BuSpar -Discontinued flexiseal 6/23  Dysphagia secondary to acute metabolic encephalopathy  better  Appreciate speech therapy cognitive eval   Eating ~ 25--40 % only of meals Patient has made some improvements in mentation, coordination, cognition Speech therapy recommended regular diet with thin liquids  Still not quite eating 50% regularly-calorie count initiated by nutritionist to determine if he is getting enough  D/c d5/ns 50 cc/h 6/23, can keep minimal vol for L sided PICC  Acute encephalopathy:   meningoencephalitis, seizures, ischemic infarcts (no antiplatelet per neuro), metabolic derangements and  medications; likely complicated by delirium.   Numerous scattered areas of ischemia on MRI   Ativan prn d/c as above off precedex since 6/8, avoid haldol with seizure disorder   Cut back Norco to q 8 prn and discontinued as above severe pain/discontinued Benadryl as per Amphotericin B protocol 6/20  Endorse sleep-wake cycle, shades up during day, OOB meals etc  Streptococcus pneumoniae bacteremia - Linezolid per ID x14 days (6/6 - last dose on 6/21)  - TTE ordered: No vegetations noted   Bordatella pneumonia - Was treated with doxycycline per ID, now off   AIDS: CD4 count April 2018 was 2.  - Hold ART per ID - Continue bactrim daily--->3 x /wk and weekly azithromycin ppx -Starting ART 628/2018? Per ID  Neutropenia, is afebrile Suspected due to flucytosine.  - Monitor as trended up word with discontinuation of some antibiotics ANC is  currently 1100 -Repeat labs in 1-2 days  Diarrhea:  Acute, likely from tube feeds.  - Flexiseal d/c 6/23 - Anti-diarrheals lomotil was  discontinued  Chronic hepatitis B - Follow up with ID , LFTs minimally elevated AST ALT 90s, alkaline phosphatase 169 6/26  Malnutrition  Has lost 11 pounds, will ask for calorie count from nutrition in addition to further recommendations  Prefer to discontinue enteral feeds however will probably need them at least for another week or week and a half to buildup protein stores  Prealbumin 6/26 is reasonable at 24  Urinary retention Bladder training a.m. 6/27, attempted voiding trial 6/25 but had to be recatheterized 6/26  scd Inpatient pending resolution Grandmother not present today will update when we know more about disposition Patient's plan has been updated to skilled facility and we are waiting on further input from social work  Consultants:    Multiple  Procedures:    multiple  Antimicrobials:   See notes    Subjective:  Did not eat any lunch only had grapes, had about 20% to 30% of The more alert however not very engagin  Objective: Vitals:   06/19/17 0254 06/19/17 0711 06/19/17 1129 06/19/17 1238  BP: (!) 103/55 128/67 (!) 92/57 98/69  Pulse: 98 82 85 96  Resp: 15 16 18 18   Temp: 97.5 F (36.4 C) 97.2 F (36.2 C) 98.5 F (36.9 C) 98.2 F (36.8 C)  TempSrc: Axillary Axillary Oral Oral  SpO2: 98% 93% 100% 97%  Weight: 64.3 kg (141 lb 11.2 oz)     Height:        Intake/Output Summary (Last 24 hours) at 06/19/17 1633 Last data filed at 06/19/17 1545  Gross per 24 hour  Intake             2020 ml  Output             4600 ml  Net            -2580 ml   Filed Weights   06/17/17 0406 06/18/17 0500 06/19/17 0254  Weight: 63.8 kg (140 lb 10.5 oz) 55.6 kg (122 lb 9.2 oz) 64.3 kg (141 lb 11.2 oz)    Examination:   Awake alert , staples visualized right hemicranium  perrla, no icterus no pallor   out of restraints -still has mitten on left hand  sitter at bedside NG tube discontinued 6/25 has Foley catheter still  s1S2 no murmur cta B  Data  Reviewed: I have personally reviewed following labs and imaging studies  CBC:  Recent Labs Lab 06/15/17 0709 06/16/17 0423 06/17/17 0530 06/18/17 0304 06/19/17 0447  WBC 1.9* 1.8* 3.1* 2.1* 3.1*  NEUTROABS 1.0* 0.8* 1.5* 1.1* 1.8  HGB 9.5* 9.6* 10.1* 9.7* 10.1*  HCT 29.2* 29.2* 30.0* 29.4* 30.2*  MCV 89.8 89.6 88.0 87.0 86.0  PLT 290 274 280 285 305   Basic Metabolic Panel:  Recent Labs Lab 06/15/17 0709 06/16/17 0423 06/16/17 0447 06/17/17 0530 06/18/17 0304 06/19/17 0447  NA 139 139 138 138 137 136  K 3.9 3.7 3.7 4.8 3.9 4.2  CL 107 109 109 109 109 109  CO2 25 20* 23 23 24 22   GLUCOSE 80 90 94 85 86 93  BUN 7 8 6 7 7 7   CREATININE 0.69 0.79 0.75 0.91 0.81 0.90  CALCIUM 9.0 9.1 9.2 9.3 8.7* 9.0  MG 1.9 1.5*  --  1.7 1.9 1.9  PHOS 6.2*  --  6.2* 7.0* 6.8* 5.5*  GFR: Estimated Creatinine Clearance: 113.1 mL/min (by C-G formula based on SCr of 0.9 mg/dL). Liver Function Tests:  Recent Labs Lab 06/13/17 1030 06/14/17 0345  06/16/17 0423 06/16/17 0447 06/17/17 0530 06/18/17 0304 06/19/17 0447  AST 65* 61*  --  68*  --   --   --  96*  ALT 69* 66*  --  72*  --   --   --  92*  ALKPHOS 134* 151*  --  142*  --   --   --  169*  BILITOT 0.2* 0.3  --  0.4  --   --   --  0.5  PROT 7.4 8.0  --  7.6  --   --   --  7.8  ALBUMIN 2.5* 2.8*  < > 2.8* 2.7* 2.9* 2.7* 2.9*  < > = values in this interval not displayed. No results for input(s): LIPASE, AMYLASE in the last 168 hours. No results for input(s): AMMONIA in the last 168 hours. Coagulation Profile:  Recent Labs Lab 06/16/17 0423  INR 0.92   Cardiac Enzymes: No results for input(s): CKTOTAL, CKMB, CKMBINDEX, TROPONINI in the last 168 hours. BNP (last 3 results) No results for input(s): PROBNP in the last 8760 hours. HbA1C: No results for input(s): HGBA1C in the last 72 hours. CBG:  Recent Labs Lab 06/18/17 0718 06/18/17 1112 06/18/17 2307 06/19/17 0529 06/19/17 1438  GLUCAP 90 90 100* 91 114*    Lipid Profile: No results for input(s): CHOL, HDL, LDLCALC, TRIG, CHOLHDL, LDLDIRECT in the last 72 hours. Thyroid Function Tests: No results for input(s): TSH, T4TOTAL, FREET4, T3FREE, THYROIDAB in the last 72 hours. Anemia Panel: No results for input(s): VITAMINB12, FOLATE, FERRITIN, TIBC, IRON, RETICCTPCT in the last 72 hours. Urine analysis:    Component Value Date/Time   COLORURINE YELLOW 05/20/2017 1742   APPEARANCEUR CLEAR 05/20/2017 1742   LABSPEC 1.020 05/20/2017 1742   PHURINE 7.0 05/20/2017 1742   GLUCOSEU NEGATIVE 05/20/2017 1742   HGBUR NEGATIVE 05/20/2017 1742   BILIRUBINUR NEGATIVE 05/20/2017 1742   KETONESUR NEGATIVE 05/20/2017 1742   PROTEINUR NEGATIVE 05/20/2017 1742   UROBILINOGEN 4.0 (H) 09/09/2014 1055   NITRITE NEGATIVE 05/20/2017 1742   LEUKOCYTESUR NEGATIVE 05/20/2017 1742   Sepsis Labs: @LABRCNTIP (procalcitonin:4,lacticidven:4)  ) No results found for this or any previous visit (from the past 240 hour(s)).   Radiology Studies: No results found. Scheduled Meds: . acetaminophen  650 mg Oral Q24H  . azithromycin  1,200 mg Per Tube Weekly  . busPIRone  5 mg Oral BID  . chlorhexidine  15 mL Mouth Rinse BID  . Chlorhexidine Gluconate Cloth  6 each Topical Daily  . feeding supplement (ENSURE ENLIVE)  237 mL Oral BID BM  . fluconazole  400 mg Oral Daily  . free water  200 mL Oral Q8H  . insulin aspart  2-6 Units Subcutaneous Q8H  . levETIRAcetam  1,000 mg Oral BID  . mouth rinse  15 mL Mouth Rinse q12n4p  . metoprolol tartrate  5 mg Intravenous Q8H  . potassium chloride  20 mEq Oral BID  . sulfamethoxazole-trimethoprim  10 mL Per Tube Once per day on Mon Wed Fri  . tamsulosin  0.4 mg Oral Daily   Continuous Infusions: . sodium chloride Stopped (06/05/17 0200)  . dextrose 5 % and 0.45% NaCl 30 mL/hr at 06/17/17 2219     LOS: 30 days    Time spent: 8315    Pleas KochJai Valen Gillison, MD [facility] [phone]   If 7PM-7AM,  please contact  night-coverage [email or web address] Password [facility] 06/19/2017, 4:33 PM

## 2017-06-19 NOTE — Progress Notes (Signed)
  Speech Language Pathology Treatment: Dysphagia  Patient Details Name: Justin Ball MRN: [id number] DOB: 11/14/1991 Today's Date: 06/19/2017 Time: 1100-1115 SLP Time Calculation (min) (ACUTE ONLY): 15 min  Assessment / Plan / Recommendation Clinical Impression  Dysphagia treatment provided for diet tolerance check. Pt lethargic at time of treatment session; initially not responding to verbal commands but became more alert/ responsive after being repositioned. Pt showed decreased sustained attention to PO trials but did consume regular food/ thin liquids provided minimal verbal cues for attention to task without overt s/s of aspiration. Pt responded briefly to orientation questions and was oriented to self/ location/ situation but disoriented to time. The greatest barrier to swallow safety at this time appears to be lethargy and attention to task, but aspiration risk is reduced with supervision and cueing. Recommend continuing regular/ thin liquid diet with meds whole in puree, full supervision to minimize distractions, provide cues for small bites/ sips, ensure pt alertness is adequate for safe intake. Will continue to follow for diet tolerance.   HPI HPI: Pt is a 26 year old man admitted s/p seizure activity having several days of recent headaches. 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 EVD was placed after acute decompensation on 5/28 with decorticate posturing and asymmetric pupils. 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 PMH includes: syphilis, substance abuse, soft palate ulceration, scabies, neuromuscular disorder, HIV/AIDS, depression, bipolar disorder, anxiety      SLP Plan  Continue with current plan of care       Recommendations  Diet recommendations: Regular;Thin liquid Liquids provided via: Cup;Straw Medication Administration: Whole meds with puree Supervision: Patient able to self feed;Full  supervision/cueing for compensatory strategies Compensations: Minimize environmental distractions;Slow rate;Small sips/bites Postural Changes and/or Swallow Maneuvers: Seated upright 90 degrees                Oral Care Recommendations: Oral care BID Follow up Recommendations: Inpatient Rehab SLP Visit Diagnosis: Dysphagia, unspecified (R13.10) Plan: Continue with current plan of care       GO                Metro KungAmy K Jayah Balthazar, MA, CCC-SLP 06/19/2017, 11:19 AM [id number]

## 2017-06-19 NOTE — Clinical Social Work Note (Signed)
CSW spoke with Murlean IbaLinda Jennings at the [facility] [phone]). She is reviewing patient's information for PASARR and is requesting PT/OT/ST evaluations and updated notes as well as the most recent MD note. CSW faxed. Ms. Marlyne BeardsJennings is also requesting that when patient is stable for discharge, the MD document that he is stable for discharge and no longer needs 1:1 sitter.  Charlynn CourtSarah Shanicka Oldenkamp, CSW [phone]

## 2017-06-19 NOTE — Telephone Encounter (Signed)
Reprinted prescription for Emergency ADAP application. Justin Ball, Justin Stringfield M, RN

## 2017-06-19 NOTE — Clinical Social Work Note (Signed)
Patient transferred from 4E to 84M. Handoff information given to 84M CSW.  This CSW signing off.  Charlynn CourtSarah Daiana Vitiello, CSW [phone]

## 2017-06-19 NOTE — Progress Notes (Signed)
Nutrition Consult (Calorie Count)/Follow Up  DOCUMENTATION CODES:   Non-severe (moderate) malnutrition in context of chronic illness  INTERVENTION:    Initiate 48 hr calorie count   Ensure Enlive po BID, each supplement provides 350 kcal and 20 grams of protein  NUTRITION DIAGNOSIS:   Malnutrition (Moderate) related to chronic illness (AIDS) as evidenced by mild depletion of muscle mass, mild depletion of body fat  Ongoing  GOAL:   Patient will meet greater than or equal to 90% of their needs  Progressing  MONITOR:   PO intake, Supplement acceptance, Labs, Weight trends, I & O's  ASSESSMENT:   Pt with PMH of bipolar 1 d/o, depression, HIV/AIDS medical noncompliance, chronic hepatitis B, substance abuse, and syphilis admitted with AMS 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  S/P VP shunt placement on 6/19. Finishing ABX course per ID. Cortrak removed 6/25. TF (Vital AF 1.2 formula) discontinued. PO intake variable at 20-100% per flowsheets. Labs reviewed. P 5.5 (H). CBG's [id number]. Medications reviewed.  Diet Order:  Diet regular Room service appropriate? Yes; Fluid consistency: Thin  Skin:   (incisions)  Last BM:  6/26  Height:   Ht Readings from Last 1 Encounters:  05/20/17 6\' 1"  (1.854 m)   Weight:   Wt Readings from Last 1 Encounters:  06/19/17 141 lb 11.2 oz (64.3 kg)   Ideal Body Weight:  83.6 kg  BMI:  Body mass index is 18.7 kg/m.  Estimated Nutritional Needs:   Kcal:  2100-2300  Protein:  100-125 grams  Fluid:  >/= 2.1 L/day  EDUCATION NEEDS:   No education needs identified at this time  Maureen ChattersKatie Jennie Hannay, RD, LDN Pager #: [phone] After-Hours Pager #: [phone]

## 2017-06-19 NOTE — Progress Notes (Signed)
Reported called to 58M. Patient will transport shortly.

## 2017-06-20 ENCOUNTER — Telehealth: Payer: Self-pay | Admitting: *Deleted

## 2017-06-20 ENCOUNTER — Encounter (HOSPITAL_COMMUNITY): Payer: Self-pay | Admitting: *Deleted

## 2017-06-20 DIAGNOSIS — B20 Human immunodeficiency virus [HIV] disease: Secondary | ICD-10-CM

## 2017-06-20 LAB — GLUCOSE, CAPILLARY
GLUCOSE-CAPILLARY: 89 mg/dL (ref 65–99)
Glucose-Capillary: 87 mg/dL (ref 65–99)
Glucose-Capillary: 87 mg/dL (ref 65–99)

## 2017-06-20 LAB — RENAL FUNCTION PANEL
ALBUMIN: 2.9 g/dL — AB (ref 3.5–5.0)
Anion gap: 7 (ref 5–15)
BUN: 10 mg/dL (ref 6–20)
CALCIUM: 9 mg/dL (ref 8.9–10.3)
CO2: 23 mmol/L (ref 22–32)
Chloride: 104 mmol/L (ref 101–111)
Creatinine, Ser: 0.96 mg/dL (ref 0.61–1.24)
GFR calc Af Amer: >60 mL/min (ref >60)
GFR calc non Af Amer: >60 mL/min (ref >60)
GLUCOSE: 90 mg/dL (ref 65–99)
PHOSPHORUS: 5.9 mg/dL — AB (ref 2.5–4.6)
POTASSIUM: 4.3 mmol/L (ref 3.5–5.1)
SODIUM: 134 mmol/L — AB (ref 135–145)

## 2017-06-20 LAB — MAGNESIUM: Magnesium: 2.2 mg/dL (ref 1.7–2.4)

## 2017-06-20 MED ORDER — SODIUM CHLORIDE 0.9% FLUSH
10.0000 mL | INTRAVENOUS | Status: DC | PRN
  Administered 2017-06-21 – 2017-06-23 (×3): 30 mL
  Filled 2017-06-20 (×3): qty 40

## 2017-06-20 MED ORDER — ENSURE ENLIVE PO LIQD
237.0000 mL | Freq: Three times a day (TID) | ORAL | Status: DC
  Administered 2017-06-20 – 2017-06-27 (×16): 237 mL via ORAL

## 2017-06-20 MED ORDER — SULFAMETHOXAZOLE-TRIMETHOPRIM 400-80 MG PO TABS: 1.0000 | ORAL_TABLET | Freq: Every day | ORAL | 11 refills | Status: DC

## 2017-06-20 MED ORDER — DIPHENHYDRAMINE HCL 25 MG PO CAPS
25.0000 mg | ORAL_CAPSULE | Freq: Once | ORAL | Status: AC
  Administered 2017-06-20: 25 mg via ORAL
  Filled 2017-06-20: qty 1

## 2017-06-20 MED ORDER — OXYCODONE HCL 5 MG PO TABS
5.0000 mg | ORAL_TABLET | Freq: Once | ORAL | Status: AC
  Administered 2017-06-20: 5 mg via ORAL
  Filled 2017-06-20: qty 1

## 2017-06-20 NOTE — Progress Notes (Signed)
Physical Therapy Treatment Patient Details Name: Justin Ball MRN: [id number] DOB: 04/05/1991 Today's Date: 06/20/2017    History of Present Illness Pt is a 26 year old man admitted s/p seizure activity having several days of recent headaches. 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 EVD was placed after acute decompensation on 5/28 with decorticate posturing and asymmetric pupils. 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 PMH includes: syphilis, substance abuse, soft palate ulceration, scabies, neuromuscular disorder, HIV/AIDS, depression, bipolar disorder, anxiety    PT Comments    Pt was able to walk a bit further with Carley HammedEva walker and two person assist with third following with the chair.  Pt needs encouragement to get moving, but did well once up on his feet.  He needs manual assist to prevent scissoring gait pattern.  Pt remains appropriate for intensive multi disciplinary post acute rehab.    Follow Up Recommendations  CIR     Equipment Recommendations  Wheelchair (measurements PT);Wheelchair cushion (measurements PT);Hospital bed    Recommendations for Other Services   NA     Precautions / Restrictions Precautions Precautions: Fall    Mobility  Bed Mobility Overal bed mobility: Needs Assistance Bed Mobility: Supine to Sit     Supine to sit: Min assist;HOB elevated Sit to supine: Mod assist   General bed mobility comments: Min assist to help complete task of moving both legs to EOB and give pt a hand to pull his trunk to sitting.  Mod assist to help lift both legs up into bed.    Transfers Overall transfer level: Needs assistance Equipment used:  (EVA walker) Transfers: Sit to/from UGI CorporationStand;Stand Pivot Transfers Sit to Stand: Mod assist;+2 physical assistance Stand pivot transfers: Mod assist;+2 physical assistance       General transfer comment: Two person mod assist to stand from regular  height bed,  pt twists to the left with his knees and pulling up on EVA walker.  Assist needed to power up over weak legs and to stablize assistive device.   Ambulation/Gait Ambulation/Gait assistance: +2 safety/equipment;+2 physical assistance;Mod assist (third person used for chair to follow during gait) Ambulation Distance (Feet): 65 Feet Assistive device:  (EVA walker) Gait Pattern/deviations: Step-through pattern;Scissoring Gait velocity: decreased Gait velocity interpretation: <1.8 ft/sec, indicative of risk for recurrent falls General Gait Details: Manual assist needed to help pt keep his feet apart and pointed in the forward direction, he tends to turn both feet to the left and scissor).  Assist needed at trunk for both balance and support over weak legs.           Balance Overall balance assessment: Needs assistance Sitting-balance support: Feet supported;No upper extremity supported;Bilateral upper extremity supported Sitting balance-Leahy Scale: Fair     Standing balance support: Bilateral upper extremity supported Standing balance-Leahy Scale: Poor                              Cognition Arousal/Alertness: Awake/alert Behavior During Therapy: Flat affect;Impulsive Overall Cognitive Status: Impaired/Different from baseline Area of Impairment: Orientation;Attention;Memory;Following commands;Safety/judgement;Awareness;Problem solving                 Orientation Level: Disoriented to;Time;Situation Current Attention Level: Sustained Memory: Decreased recall of precautions;Decreased short-term memory Following Commands: Follows one step commands with increased time Safety/Judgement: Decreased awareness of safety;Decreased awareness of deficits Awareness: Intellectual Problem Solving: Slow processing;Decreased initiation;Difficulty sequencing;Requires verbal cues;Requires tactile cues General Comments: Pt follows one step  commands if given time to  complete and process.  He needs multimodal cues to sequence and initiate movement.              Pertinent Vitals/Pain Pain Assessment: No/denies pain Pain Score: 0-No pain           PT Goals (current goals can now be found in the care plan section) Acute Rehab PT Goals Patient Stated Goal: to stay in the bed PT Goal Formulation: With patient Time For Goal Achievement: 07/04/17 Potential to Achieve Goals: Good Progress towards PT goals: Progressing toward goals (goals updated)    Frequency    Min 3X/week      PT Plan Current plan remains appropriate       AM-PAC PT "6 Clicks" Daily Activity  Outcome Measure  Difficulty turning over in bed (including adjusting bedclothes, sheets and blankets)?: Total Difficulty moving from lying on back to sitting on the side of the bed? : Total Difficulty sitting down on and standing up from a chair with arms (e.g., wheelchair, bedside commode, etc,.)?: Total Help needed moving to and from a bed to chair (including a wheelchair)?: A Lot Help needed walking in hospital room?: A Lot Help needed climbing 3-5 steps with a railing? : Total 6 Click Score: 8    End of Session Equipment Utilized During Treatment: Gait belt Activity Tolerance: Patient limited by fatigue Patient left: in bed;with call bell/phone within reach;with bed alarm set;with nursing/sitter in room Nurse Communication: Mobility status PT Visit Diagnosis: Other symptoms and signs involving the nervous system (R29.898);Muscle weakness (generalized) (M62.81);Difficulty in walking, not elsewhere classified (R26.2)     Time: 9604-5409 PT Time Calculation (min) (ACUTE ONLY): 27 min  Charges:  $Gait Training: 8-22 mins $Therapeutic Activity: 8-22 mins                    Torah Pinnock B. Aminta Sakurai, PT, DPT [phone]   06/20/2017, 1:10 PM

## 2017-06-20 NOTE — Progress Notes (Signed)
I have notified SW that [employer]'s [employer] insurance remains active. His employer will maintain his policy for the next 2-3 months per his Mom who was contacted.

## 2017-06-20 NOTE — Progress Notes (Signed)
Pharmacy Consult Note:  Electrolyte Replacement  26 yom currently on treatment for cryptococcal meningitis and strep pneumo bacteremia. Pharmacy consulted by ID to manage electrolyte replacement. Off amphotericin B now. SCr remains stable. K 4.3, Mg 2.2 today. Stable from yesterday, po KCL d/c'd. Probably doesn't need ongoing replacement anymore.    Plan: Pharmacy sign off  Bayard HuggerMei Lesley Atkin, PharmD, BCPS  Clinical Pharmacist  Pager: [phone]    06/20/2017 2:27 PM

## 2017-06-20 NOTE — Progress Notes (Signed)
PROGRESS NOTE    Justin Ball   JXB:[id number]  DOB: 08/27/1991  DOA: 05/20/2017 PCP: Randall HissVan Dam, [name], MD   Brief Narrative:   26 y/o with AIDS, Syphilis, Chronic Hep B, substance abuse, cigarette smoker, Bipolar disorder and poor compliance with treatment presented to ER on 5/27 intially unresponsive and then noted to be having seizures int he ER. He had been seen in the ER 3 day prior for a headache, underwent a CT of the head which was unrevealing and was discharged from the ER. Apparently he was also having fevers at home. 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 This has been treated surgically with a VP shunt (6/19 by Dr [name]) and medically treated with Flucytosine which was switched to Amphotericin due to leukopenia. Amphotericin transitioned to Fluconazole on 6/26.  Seizures being managed with Keppra.  Further complications included Strep pneumo bacteremia, metabolic encephalopathy, dysphagia requiring nutrition via NG tube CD4 count is 10 and he is maintained on Bactrim prophylaxis.    Subjective: Poor eye contact. No complaints.   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 [name] 6/19 - S/p ventriculostomy 5/28-6/8.  - Keppra per neurology for seizure   - Bactrim changed to 3 times weekly 6/23 - EVD removed 6/8  - LP repeated 6/12 with opening pressure 27mmHg - LP repeated 6/15 with opening pressure 19mmHg  - Continue treatment per ID- now on Fluconazole  Dysphagia secondary to acute metabolic encephalopathy   - underewent speech therapy cognitive eval  - NG tube removed - oral intake is steadily improving- cognitive status is likely limiting hime  Acute encephalopathy:   -  meningoencephalitis, seizures, ischemic infarcts (no  antiplatelet per neuro), metabolic derangements and medications    -Numerous scattered areas of ischemia on MRI   Streptococcus pneumoniae bacteremia - has completed Linezolid per ID x14 days (6/6 - last dose on 6/21)  - TTE ordered: No vegetations noted   Bordatella pneumonia - Was treated with doxycycline per ID   AIDS - CD4 count April 2018 was 2.  - Hold ART per ID - Continue bactrim daily--->3 x /wk and weekly azithromycin ppx -Starting ART 628/2018? Per ID  Neutropenia, is afebrile - Suspected due to flucytosine.  - steadily improving  Diarrhea:  - Acute, likely from tube feeds and has resolved - Flexiseal d/c 6/23  Chronic hepatitis B - Follow up with ID , LFTs minimally elevated AST ALT 90s, alkaline phosphatase 169 6/26  Malnutrition - Has lost 11 pounds  - cont to follow nutrition  - Prealbumin 6/26 is reasonable at 24  Urinary retention Bladder training, attempted voiding trial 6/25 but had to be recatheterized 6/26    DVT prophylaxis: SCD Code Status: Full code Family Communication:  Disposition Plan: SNF Consultants:    PCCM   ID  NS  Neurology Procedures:   V-P shunt  Intubation  LP  Video EEG Antimicrobials:  Anti-infectives    Start     Dose/Rate Route Frequency Ordered Stop   06/19/17 1200  fluconazole (DIFLUCAN) tablet 400 mg     400 mg Oral Daily 06/19/17 1045     06/18/17 0900  sulfamethoxazole-trimethoprim (BACTRIM,SEPTRA) 200-40 MG/5ML suspension 10 mL     10 mL Per Tube Once per day on Mon Wed Fri 06/16/17 1541     06/13/17 1500  linezolid (ZYVOX)  tablet 600 mg  Status:  Discontinued     600 mg Oral Every 12 hours 06/13/17 1427 06/15/17 1333   06/12/17 1354  bacitracin 50,000 Units in sodium chloride irrigation 0.9 % 500 mL irrigation  Status:  Discontinued       As needed 06/12/17 1355 06/12/17 1430   05/31/17 1000  azithromycin (ZITHROMAX) 200 MG/5ML suspension 1,200 mg     1,200 mg Per Tube Weekly 05/24/17 1315      05/30/17 1130  linezolid (ZYVOX) IVPB 600 mg  Status:  Discontinued     600 mg 300 mL/hr over 60 Minutes Intravenous Every 12 hours 05/30/17 1127 06/12/17 1656   05/26/17 1215  doxycycline (VIBRAMYCIN) 100 mg in dextrose 5 % 250 mL IVPB  Status:  Discontinued     100 mg 125 mL/hr over 120 Minutes Intravenous Every 12 hours 05/26/17 1212 06/01/17 1448   05/26/17 0554  cefTRIAXone (ROCEPHIN) 2 g in dextrose 5 % 50 mL IVPB  Status:  Discontinued     2 g 100 mL/hr over 30 Minutes Intravenous Every 24 hours 05/25/17 1358 05/31/17 1323   05/24/17 1230  azithromycin (ZITHROMAX) 1,200 mg in dextrose 5 % 250 mL IVPB     1,200 mg 250 mL/hr over 60 Minutes Intravenous Weekly 05/24/17 1128 05/24/17 1407   05/21/17 0800  emtricitabine-rilpivir-tenofovir AF (ODEFSEY) 200-25-25 MG per tablet 1 tablet  Status:  Discontinued     1 tablet Oral Daily with breakfast 05/20/17 1813 05/20/17 1909   05/21/17 0600  vancomycin (VANCOCIN) IVPB 1000 mg/200 mL premix  Status:  Discontinued     1,000 mg 200 mL/hr over 60 Minutes Intravenous Every 8 hours 05/21/17 0549 05/21/17 1400   05/21/17 0545  cefTRIAXone (ROCEPHIN) 2 g in dextrose 5 % 50 mL IVPB  Status:  Discontinued     2 g 100 mL/hr over 30 Minutes Intravenous Every 12 hours 05/21/17 0531 05/25/17 1358   05/21/17 0100  cefTRIAXone (ROCEPHIN) 2 g in dextrose 5 % 50 mL IVPB  Status:  Discontinued     2 g 100 mL/hr over 30 Minutes Intravenous Every 12 hours 05/20/17 1245 05/20/17 1919   05/20/17 2100  vancomycin (VANCOCIN) IVPB 1000 mg/200 mL premix  Status:  Discontinued     1,000 mg 200 mL/hr over 60 Minutes Intravenous Every 8 hours 05/20/17 1245 05/20/17 1918   05/20/17 2000  ampicillin (OMNIPEN) 1 g in sodium chloride 0.9 % 50 mL IVPB  Status:  Discontinued     1 g 150 mL/hr over 20 Minutes Intravenous Every 4 hours 05/20/17 1833 05/20/17 1836   05/20/17 2000  ampicillin (OMNIPEN) 2 g in sodium chloride 0.9 % 50 mL IVPB  Status:  Discontinued     2  g 150 mL/hr over 20 Minutes Intravenous Every 4 hours 05/20/17 1836 05/20/17 1919   05/20/17 2000  sulfamethoxazole-trimethoprim (BACTRIM,SEPTRA) 200-40 MG/5ML suspension 10 mL  Status:  Discontinued     10 mL Per Tube Daily 05/20/17 1901 06/16/17 1541   05/20/17 2000  emtricitabine-tenofovir AF (DESCOVY) 200-25 MG per tablet 1 tablet  Status:  Discontinued     1 tablet Oral Daily 05/20/17 1909 05/20/17 1919   05/20/17 2000  rilpivirine (EDURANT) tablet 25 mg  Status:  Discontinued     25 mg Per Tube Daily with breakfast 05/20/17 1909 05/20/17 1919   05/20/17 2000  flucytosine (ANCOBON) capsule 1,500 mg  Status:  Discontinued     1,500 mg Per Tube Every 6 hours 05/20/17 1926  06/16/17 1541   05/20/17 1815  dolutegravir (TIVICAY) tablet 50 mg  Status:  Discontinued     50 mg Oral Daily 05/20/17 1813 05/20/17 1919   05/20/17 1815  sulfamethoxazole-trimethoprim (BACTRIM,SEPTRA) 400-80 MG per tablet 1 tablet  Status:  Discontinued     1 tablet Oral Daily 05/20/17 1813 05/20/17 1901   05/20/17 1800  flucytosine (ANCOBON) capsule 1,500 mg  Status:  Discontinued     25 mg/kg  63.5 kg Per Tube Every 6 hours 05/20/17 1645 05/20/17 1926   05/20/17 1700  amphotericin B liposome (AMBISOME) 250 mg in dextrose 5 % 500 mL IVPB  Status:  Discontinued     4 mg/kg  63.5 kg 250 mL/hr over 120 Minutes Intravenous Every 24 hours 05/20/17 1645 06/19/17 1045   05/20/17 1300  ampicillin (OMNIPEN) 2 g in sodium chloride 0.9 % 50 mL IVPB  Status:  Discontinued     2 g 150 mL/hr over 20 Minutes Intravenous  Once 05/20/17 1237 05/20/17 1242   05/20/17 1300  acyclovir (ZOVIRAX) 635 mg in dextrose 5 % 100 mL IVPB  Status:  Discontinued     10 mg/kg  63.5 kg 112.7 mL/hr over 60 Minutes Intravenous Every 8 hours 05/20/17 1243 05/20/17 1918   05/20/17 1245  cefTRIAXone (ROCEPHIN) 2 g in dextrose 5 % 50 mL IVPB     2 g 100 mL/hr over 30 Minutes Intravenous  Once 05/20/17 1237 05/20/17 1327   05/20/17 1245  vancomycin  (VANCOCIN) IVPB 1000 mg/200 mL premix     1,000 mg 200 mL/hr over 60 Minutes Intravenous  Once 05/20/17 1237 05/20/17 1420       Objective: Vitals:   06/19/17 1726 06/19/17 2250 06/19/17 2300 06/20/17 0521  BP: 104/71 (!) 107/49 111/64 127/76  Pulse: (!) 108 (!) 104 97 94  Resp: 18 20 20 18   Temp: 99.1 F (37.3 C) 98.1 F (36.7 C)  98.5 F (36.9 C)  TempSrc: Oral Oral  Oral  SpO2: 100% 100% 100% 100%  Weight:    65 kg (143 lb 3.2 oz)  Height:        Intake/Output Summary (Last 24 hours) at 06/20/17 1610 Last data filed at 06/20/17 0900  Gross per 24 hour  Intake             1287 ml  Output             3935 ml  Net            -2648 ml   Filed Weights   06/18/17 0500 06/19/17 0254 06/20/17 0521  Weight: 55.6 kg (122 lb 9.2 oz) 64.3 kg (141 lb 11.2 oz) 65 kg (143 lb 3.2 oz)    Examination: General exam: Appears comfortable - sleepy HEENT: PERRLA, oral mucosa moist, no sclera icterus or thrush Respiratory system: Clear to auscultation. Respiratory effort normal. Cardiovascular system: S1 & S2 heard, RRR.  No murmurs  Gastrointestinal system: Abdomen soft, non-tender, nondistended. Normal bowel sound. No organomegaly Central nervous system: Alert - No focal neurological deficits. Extremities: No cyanosis, clubbing or edema Skin: No rashes or ulcers Psychiatry:  Minimally verbal    Data Reviewed: I have personally reviewed following labs and imaging studies  CBC:  Recent Labs Lab 06/15/17 0709 06/16/17 0423 06/17/17 0530 06/18/17 0304 06/19/17 0447  WBC 1.9* 1.8* 3.1* 2.1* 3.1*  NEUTROABS 1.0* 0.8* 1.5* 1.1* 1.8  HGB 9.5* 9.6* 10.1* 9.7* 10.1*  HCT 29.2* 29.2* 30.0* 29.4* 30.2*  MCV 89.8 89.6 88.0  87.0 86.0  PLT 290 274 280 285 305   Basic Metabolic Panel:  Recent Labs Lab 06/16/17 0423 06/16/17 0447 06/17/17 0530 06/18/17 0304 06/19/17 0447 06/20/17 0422 06/20/17 0431  NA 139 138 138 137 136  --  134*  K 3.7 3.7 4.8 3.9 4.2  --  4.3  CL 109  109 109 109 109  --  104  CO2 20* 23 23 24 22   --  23  GLUCOSE 90 94 85 86 93  --  90  BUN 8 6 7 7 7   --  10  CREATININE 0.79 0.75 0.91 0.81 0.90  --  0.96  CALCIUM 9.1 9.2 9.3 8.7* 9.0  --  9.0  MG 1.5*  --  1.7 1.9 1.9 2.2  --   PHOS  --  6.2* 7.0* 6.8* 5.5*  --  5.9*   GFR: Estimated Creatinine Clearance: 107.2 mL/min (by C-G formula based on SCr of 0.96 mg/dL). Liver Function Tests:  Recent Labs Lab 06/13/17 1030 06/14/17 0345  06/16/17 0423 06/16/17 0447 06/17/17 0530 06/18/17 0304 06/19/17 0447 06/20/17 0431  AST 65* 61*  --  68*  --   --   --  96*  --   ALT 69* 66*  --  72*  --   --   --  92*  --   ALKPHOS 134* 151*  --  142*  --   --   --  169*  --   BILITOT 0.2* 0.3  --  0.4  --   --   --  0.5  --   PROT 7.4 8.0  --  7.6  --   --   --  7.8  --   ALBUMIN 2.5* 2.8*  < > 2.8* 2.7* 2.9* 2.7* 2.9* 2.9*  < > = values in this interval not displayed. No results for input(s): LIPASE, AMYLASE in the last 168 hours. No results for input(s): AMMONIA in the last 168 hours. Coagulation Profile:  Recent Labs Lab 06/16/17 0423  INR 0.92   Cardiac Enzymes: No results for input(s): CKTOTAL, CKMB, CKMBINDEX, TROPONINI in the last 168 hours. BNP (last 3 results) No results for input(s): PROBNP in the last 8760 hours. HbA1C: No results for input(s): HGBA1C in the last 72 hours. CBG:  Recent Labs Lab 06/18/17 2307 06/19/17 0529 06/19/17 1438 06/19/17 2158 06/20/17 0525  GLUCAP 100* 91 114* 111* 87   Lipid Profile: No results for input(s): CHOL, HDL, LDLCALC, TRIG, CHOLHDL, LDLDIRECT in the last 72 hours. Thyroid Function Tests: No results for input(s): TSH, T4TOTAL, FREET4, T3FREE, THYROIDAB in the last 72 hours. Anemia Panel: No results for input(s): VITAMINB12, FOLATE, FERRITIN, TIBC, IRON, RETICCTPCT in the last 72 hours. Urine analysis:    Component Value Date/Time   COLORURINE YELLOW 05/20/2017 1742   APPEARANCEUR CLEAR 05/20/2017 1742   LABSPEC 1.020  05/20/2017 1742   PHURINE 7.0 05/20/2017 1742   GLUCOSEU NEGATIVE 05/20/2017 1742   HGBUR NEGATIVE 05/20/2017 1742   BILIRUBINUR NEGATIVE 05/20/2017 1742   KETONESUR NEGATIVE 05/20/2017 1742   PROTEINUR NEGATIVE 05/20/2017 1742   UROBILINOGEN 4.0 (H) 09/09/2014 1055   NITRITE NEGATIVE 05/20/2017 1742   LEUKOCYTESUR NEGATIVE 05/20/2017 1742   Sepsis Labs: @LABRCNTIP (procalcitonin:4,lacticidven:4) )No results found for this or any previous visit (from the past 240 hour(s)).       Radiology Studies: No results found.    Scheduled Meds: . azithromycin  1,200 mg Per Tube Weekly  . busPIRone  5 mg Oral BID  .  chlorhexidine  15 mL Mouth Rinse BID  . Chlorhexidine Gluconate Cloth  6 each Topical Daily  . feeding supplement (ENSURE ENLIVE)  237 mL Oral BID BM  . fluconazole  400 mg Oral Daily  . free water  200 mL Oral Q8H  . insulin aspart  2-6 Units Subcutaneous Q8H  . levETIRAcetam  1,000 mg Oral BID  . mouth rinse  15 mL Mouth Rinse q12n4p  . metoprolol tartrate  5 mg Intravenous Q8H  . potassium chloride  20 mEq Oral BID  . sulfamethoxazole-trimethoprim  10 mL Per Tube Once per day on Mon Wed Fri  . tamsulosin  0.4 mg Oral Daily   Continuous Infusions: . sodium chloride Stopped (06/05/17 0200)  . dextrose 5 % and 0.45% NaCl 30 mL/hr at 06/20/17 0201     LOS: 31 days    Time spent in minutes: 35    Calvert Cantor, MD [facility] Pager: [email or web address] Password [facility] 06/20/2017, 9:37 AM

## 2017-06-20 NOTE — Progress Notes (Addendum)
Nutrition Follow Up/Brief Note  Calorie count follow up. No envelope on pt's door with meal tickets. Sitter at bedside. Pt states he enjoys chocolate Ensure Enlive supplements. RN reported pt had already drank 2 today. Average PO intake at this time is 50% per flowsheet records. Feel pt can meet >/= 90% of estimated nutrition needs with continued intake of nutrition supplement TID. Will D/C calorie count.  Maureen ChattersKatie Journie Howson, RD, LDN Pager #: [phone] After-Hours Pager #: [phone]

## 2017-06-20 NOTE — Telephone Encounter (Signed)
Prescription printed for emergency adap approval. Andree CossHowell, Michelle M, RN

## 2017-06-20 NOTE — Progress Notes (Signed)
Occupational Therapy Treatment Patient Details Name: PIER BOSHER MRN: [id number] DOB: 1991-10-03 Today's Date: 06/20/2017    History of present illness Pt is a 26 year old man admitted s/p seizure activity having several days of recent headaches. 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 EVD was placed after acute decompensation on 5/28 with decorticate posturing and asymmetric pupils. 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 PMH includes: syphilis, substance abuse, soft palate ulceration, scabies, neuromuscular disorder, HIV/AIDS, depression, bipolar disorder, anxiety   OT comments  Pt demonstrates improved attention today demonstrated by improving ability to participate in self care activities.  He requires mod A for simple grooming tasks and mod A for functional transfers.   Continue to feel he would greatly benefit from CIR.  Will continue to follow.   Follow Up Recommendations  Supervision/Assistance - 24 hour    Equipment Recommendations  None recommended by OT    Recommendations for Other Services Rehab consult    Precautions / Restrictions Precautions Precautions: Fall       Mobility Bed Mobility Overal bed mobility: Needs Assistance Bed Mobility: Supine to Sit;Sit to Supine     Supine to sit: Min assist Sit to supine: Min assist   General bed mobility comments: assist to initiate activity   Transfers Overall transfer level: Needs assistance Equipment used:   Transfers: Sit to/from [employer] Sit to Stand: Min assist Stand pivot transfers: Mod assist       General transfer comment: min A for balance and mod A to shift weight and pivot     Balance Overall balance assessment: Needs assistance Sitting-balance support: Feet supported Sitting balance-Leahy Scale: Fair Sitting balance - Comments: Pt sat EOB > 15 mins with min guard assist    Standing balance support:  Single extremity supported Standing balance-Leahy Scale: Poor Standing balance comment: requires min A                            ADL either performed or assessed with clinical judgement   ADL Overall ADL's : Needs assistance/impaired     Grooming: Wash/dry hands;Wash/dry face;Oral care;Moderate assistance;Sitting Grooming Details (indicate cue type and reason): Pt requires max cues to attend to task to completion and max cues for sequencing and organization  Upper Body Bathing: Maximal assistance   Lower Body Bathing: Maximal assistance;Sit to/from stand   Upper Body Dressing : Moderate assistance;Sitting   Lower Body Dressing: Maximal assistance;Sit to/from stand   Toilet Transfer: Moderate assistance;Stand-pivot;BSC   Toileting- Clothing Manipulation and Hygiene: Total assistance;Bed level       Functional mobility during ADLs: Moderate assistance       Vision       Perception     Praxis      Cognition Arousal/Alertness: Awake/alert Behavior During Therapy: Flat affect Overall Cognitive Status: Impaired/Different from baseline Area of Impairment: Orientation;Attention;Memory;Following commands;Safety/judgement;Awareness;Problem solving                 Orientation Level: Disoriented to;Time Current Attention Level: Sustained Memory: Decreased short-term memory Following Commands: Follows one step commands consistently;Follows one step commands with increased time Safety/Judgement: Decreased awareness of deficits;Decreased awareness of safety Awareness: Intellectual Problem Solving: Slow processing;Decreased initiation;Difficulty sequencing;Requires verbal cues;Requires tactile cues General Comments: Pt knows he is at [facility], and there is something wrong with his brain.  He thinks its December 2007.   He is slow to respond.  He is able to  sustain attention to familiar ADL tasks x 10 second intervals         Exercises     Shoulder  Instructions       General Comments      Pertinent Vitals/ Pain       Pain Assessment: No/denies pain Pain Score: 0-No pain  Home Living                                          Prior Functioning/Environment              Frequency  Min 3X/week        Progress Toward Goals  OT Goals(current goals can now be found in the care plan section)  Progress towards OT goals: Progressing toward goals  Acute Rehab OT Goals Patient Stated Goal: to stay in the bed  Plan Discharge plan remains appropriate    Co-evaluation                 AM-PAC PT "6 Clicks" Daily Activity     Outcome Measure   Help from another person eating meals?: A Lot Help from another person taking care of personal grooming?: A Lot Help from another person toileting, which includes using toliet, bedpan, or urinal?: A Lot Help from another person bathing (including washing, rinsing, drying)?: A Lot Help from another person to put on and taking off regular upper body clothing?: A Lot Help from another person to put on and taking off regular lower body clothing?: A Lot 6 Click Score: 12    End of Session    OT Visit Diagnosis: Unsteadiness on feet (R26.81);Other abnormalities of gait and mobility (R26.89);Muscle weakness (generalized) (M62.81);Feeding difficulties (R63.3);Other symptoms and signs involving the nervous system (R29.898);Other symptoms and signs involving cognitive function;Cognitive communication deficit (R41.841)   Activity Tolerance Patient tolerated treatment well   Patient Left in bed;with call bell/phone within reach;with bed alarm set   Nurse Communication Mobility status        Time: [id number] OT Time Calculation (min): 22 min  Charges: OT General Charges $OT Visit: 1 Procedure OT Treatments $Self Care/Home Management : 8-22 mins  Reynolds AmericanWendi Latayvia Mandujano, OTR/L [phone]    Jeani HawkingConarpe, Karo Rog M 06/20/2017, 1:33 PM

## 2017-06-20 NOTE — Progress Notes (Signed)
Pt said to have not voided since foley catheter was d/ced, bladder scan was done earlier to a volume of , in and out cath done, drained out of clear urine, pt made comfortable in bed and was reassured, will continue to monitor. Obasogie-Asidi, Tara Wich Efe

## 2017-06-21 LAB — COMPREHENSIVE METABOLIC PANEL
ALBUMIN: 3.2 g/dL — AB (ref 3.5–5.0)
ALK PHOS: 180 U/L — AB (ref 38–126)
ALT: 122 U/L — ABNORMAL HIGH (ref 17–63)
ANION GAP: 10 (ref 5–15)
AST: 117 U/L — ABNORMAL HIGH (ref 15–41)
BUN: 14 mg/dL (ref 6–20)
CHLORIDE: 102 mmol/L (ref 101–111)
CO2: 22 mmol/L (ref 22–32)
Calcium: 9.3 mg/dL (ref 8.9–10.3)
Creatinine, Ser: 0.98 mg/dL (ref 0.61–1.24)
GFR calc non Af Amer: >60 mL/min (ref >60)
GLUCOSE: 109 mg/dL — AB (ref 65–99)
POTASSIUM: 3.8 mmol/L (ref 3.5–5.1)
SODIUM: 134 mmol/L — AB (ref 135–145)
Total Bilirubin: 0.4 mg/dL (ref 0.3–1.2)
Total Protein: 9.4 g/dL — ABNORMAL HIGH (ref 6.5–8.1)

## 2017-06-21 LAB — GLUCOSE, CAPILLARY
GLUCOSE-CAPILLARY: 101 mg/dL — AB (ref 65–99)
Glucose-Capillary: 97 mg/dL (ref 65–99)
Glucose-Capillary: 85 mg/dL (ref 65–99)

## 2017-06-21 LAB — RENAL FUNCTION PANEL
ANION GAP: 12 (ref 5–15)
Albumin: 3.1 g/dL — ABNORMAL LOW (ref 3.5–5.0)
BUN: 14 mg/dL (ref 6–20)
CALCIUM: 9.3 mg/dL (ref 8.9–10.3)
CO2: 20 mmol/L — ABNORMAL LOW (ref 22–32)
Chloride: 102 mmol/L (ref 101–111)
Creatinine, Ser: 0.91 mg/dL (ref 0.61–1.24)
Glucose, Bld: 103 mg/dL — ABNORMAL HIGH (ref 65–99)
PHOSPHORUS: 5.2 mg/dL — AB (ref 2.5–4.6)
Potassium: 3.8 mmol/L (ref 3.5–5.1)
SODIUM: 134 mmol/L — AB (ref 135–145)

## 2017-06-21 LAB — MAGNESIUM: Magnesium: 1.8 mg/dL (ref 1.7–2.4)

## 2017-06-21 MED ORDER — METOPROLOL TARTRATE 5 MG/5ML IV SOLN
2.5000 mg | Freq: Once | INTRAVENOUS | Status: AC
  Administered 2017-06-21: 2.5 mg via INTRAVENOUS
  Filled 2017-06-21: qty 5

## 2017-06-21 MED ORDER — SODIUM CHLORIDE 0.9 % IV BOLUS (SEPSIS)
250.0000 mL | Freq: Once | INTRAVENOUS | Status: AC
  Administered 2017-06-21: 250 mL via INTRAVENOUS

## 2017-06-21 NOTE — Progress Notes (Signed)
Physical Therapy Treatment Patient Details Name: Justin Ball MRN: [id number] DOB: [birth year] Today's Date: 06/21/2017    History of Present Illness Pt is a 26 year old man admitted s/p seizure activity having several days of recent headaches. 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 EVD was placed after acute decompensation on 5/28 with decorticate posturing and asymmetric pupils. 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 PMH includes: syphilis, substance abuse, soft palate ulceration, scabies, neuromuscular disorder, HIV/AIDS, depression, bipolar disorder, anxiety    PT Comments    Pt was able to get OOB and take some pivotal steps to the recliner chair with one person assist.  He has been trying to get up out of bed all morning wanting to go home and talk to his mom.  We called his mom on the phone and she reassured him that she was going to come visit after work today.  PT helped get him clean after having a BM in the bed.  I told him we would go for a walk later this PM when I have more help.  He seemed happy about the idea of getting moving.  He is not safe to be in the chair right now alone.  The tele sitter would not be enough to keep him there.    Follow Up Recommendations  CIR     Equipment Recommendations  Wheelchair (measurements PT);Wheelchair cushion (measurements PT);Hospital bed    Recommendations for Other Services Rehab consult     Precautions / Restrictions Precautions Precautions: Fall Precaution Comments: pt weak on his feet    Mobility  Bed Mobility Overal bed mobility: Needs Assistance Bed Mobility: Supine to Sit;Sit to Supine     Supine to sit: Min assist Sit to supine: Min assist   General bed mobility comments: Min assist to provide hand to pull up and untagle his legs.    Transfers Overall transfer level: Needs assistance Equipment used: 1 person hand held assist Transfers:  Sit to/from [employer] Sit to Stand: Min assist Stand pivot transfers: Min assist       General transfer comment: stand pivot transfer to recliner chair in order to get pt's bed clean (he had a BM and there was feces all over his bed).  Pt unaware that he had a BM.  total assist for peri care.   Ambulation/Gait             General Gait Details: not safe without a second person.           Balance Overall balance assessment: Needs assistance Sitting-balance support: Feet supported Sitting balance-Leahy Scale: Fair     Standing balance support: Bilateral upper extremity supported Standing balance-Leahy Scale: Poor                              Cognition Arousal/Alertness: Awake/alert Behavior During Therapy: Restless;Flat affect Overall Cognitive Status: Impaired/Different from baseline Area of Impairment: Orientation;Attention;Memory;Following commands;Safety/judgement;Awareness;Problem solving                 Orientation Level: Disoriented to;Place;Time;Situation Current Attention Level: Sustained Memory: Decreased recall of precautions;Decreased short-term memory Following Commands: Follows one step commands consistently;Follows one step commands with increased time Safety/Judgement: Decreased awareness of deficits;Decreased awareness of safety Awareness: Intellectual Problem Solving: Slow processing;Decreased initiation;Difficulty sequencing;Requires verbal cues;Requires tactile cues General Comments: "They think I am crazy" pt wanting to go home, asking me to call his mom, giving  me too many numbers for the number to be correct.          General Comments General comments (skin integrity, edema, etc.): pt very restless this AM, frequently trying to get up      Pertinent Vitals/Pain Pain Assessment: No/denies pain       Prior Function            PT Goals (current goals can now be found in the care plan section) Acute  Rehab PT Goals Patient Stated Goal: to go home today Progress towards PT goals: Progressing toward goals    Frequency    Min 3X/week      PT Plan Current plan remains appropriate       AM-PAC PT "6 Clicks" Daily Activity  Outcome Measure  Difficulty turning over in bed (including adjusting bedclothes, sheets and blankets)?: None Difficulty moving from lying on back to sitting on the side of the bed? : Total Difficulty sitting down on and standing up from a chair with arms (e.g., wheelchair, bedside commode, etc,.)?: Total Help needed moving to and from a bed to chair (including a wheelchair)?: A Little Help needed walking in hospital room?: A Lot Help needed climbing 3-5 steps with a railing? : A Lot 6 Click Score: 13    End of Session Equipment Utilized During Treatment: Gait belt Activity Tolerance: Other (comment) (limited by safety awareness, restlessness) Patient left: in bed;with call bell/phone within reach;with bed alarm set;with nursing/sitter in room (tele sitter) Nurse Communication: Mobility status PT Visit Diagnosis: Other symptoms and signs involving the nervous system (R29.898);Muscle weakness (generalized) (M62.81);Difficulty in walking, not elsewhere classified (R26.2)     Time: [id number] PT Time Calculation (min) (ACUTE ONLY): 24 min  Charges:  $Therapeutic Activity: 23-37 mins          Justin Ball, PT, DPT [phone]            06/21/2017, 4:03 PM

## 2017-06-21 NOTE — Progress Notes (Signed)
CSW following to help facilitate discharge planning. Patient's [employer] insurance remains active, so referral has been faxed to SNF in the area that accept his insurance. Family will be updated with bed offers when available. Patient's sitter currently remains active, as patient continues to try to get up out of the bed; will need to be DC'd for 24 hours prior to discharge.   CSW will continue to follow to facilitate discharge.  Blenda NicelyElizabeth Terald Jump, KentuckyLCSW Clinical Social Worker [phone]

## 2017-06-21 NOTE — Progress Notes (Signed)
PROGRESS NOTE    Justin Ball   UJW:[id number]  DOB: 09/27/1991  DOA: 05/20/2017 PCP: Randall HissVan Dam, [name], MD   Brief Narrative:   26 y/o with AIDS, Syphilis, Chronic Hep B, substance abuse, cigarette smoker, Bipolar disorder and poor compliance with treatment presented to ER on 5/27 intially unresponsive and then noted to be having seizures int he ER. He had been seen in the ER 3 day prior for a headache, underwent a CT of the head which was unrevealing and was discharged from the ER. Apparently he was also having fevers at home. 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 This has been treated surgically with a VP shunt (6/19 by Dr [name]) and medically treated with Flucytosine which was switched to Amphotericin due to leukopenia. Amphotericin transitioned to Fluconazole on 6/26.  Seizures being managed with Keppra.  Further complications included Strep pneumo bacteremia, metabolic encephalopathy, dysphagia requiring nutrition via NG tube CD4 count is 10 and he is maintained on Bactrim prophylaxis.    Subjective: Restless this AM. No complaints.   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 [name] 6/19 - S/p ventriculostomy 5/28-6/8.  - Keppra per neurology for seizure   - Bactrim changed to 3 times weekly 6/23 - EVD removed 6/8  - LP repeated 6/12 with opening pressure 27mmHg - LP repeated 6/15 with opening pressure 19mmHg  - Continue treatment per ID- now on Fluconazole  U retention voiding trial 6/25 but had to be recatheterized 6/26 - failed voiding trail again - foley replaced  Dysphagia secondary to acute metabolic encephalopathy   - underewent speech therapy cognitive eval  - NG tube removed - oral intake is steadily improving- cognitive  status is likely limiting hime  Acute encephalopathy:   -  meningoencephalitis, seizures, ischemic infarcts (no antiplatelet per neuro), metabolic derangements and medications    -Numerous scattered areas of ischemia on MRI   Streptococcus pneumoniae bacteremia - has completed Linezolid per ID x14 days (6/6 - last dose on 6/21)  - TTE ordered: No vegetations noted   Bordatella pneumonia - Was treated with doxycycline per ID   AIDS - CD4 count April 2018 was 2.  - Hold ART per ID - Continue bactrim daily--->3 x /wk and weekly azithromycin ppx -Starting ART 628/2018? Per ID  Neutropenia, is afebrile - Suspected due to flucytosine.  - steadily improving  Diarrhea:  - Acute, likely from tube feeds and has resolved - Flexiseal d/c 6/23  Chronic hepatitis B - Follow up with ID , LFTs minimally elevated AST ALT 90s, alkaline phosphatase 169 6/26  Malnutrition - cont to follow nutrition  - Prealbumin 6/26 is reasonable at 24  DVT prophylaxis: SCD Code Status: Full code Family Communication: mother, grandmother Disposition Plan: SNF Consultants:    PCCM   ID  NS  Neurology Procedures:   V-P shunt  Intubation  LP  Video EEG Antimicrobials:  Anti-infectives    Start     Dose/Rate Route Frequency Ordered Stop   06/19/17 1200  fluconazole (DIFLUCAN) tablet 400 mg     400 mg Oral Daily 06/19/17 1045     06/18/17 0900  sulfamethoxazole-trimethoprim (BACTRIM,SEPTRA) 200-40 MG/5ML suspension 10 mL     10 mL Per Tube Once per day on Mon Wed Fri 06/16/17 1541     06/13/17 1500  linezolid (ZYVOX) tablet 600  mg  Status:  Discontinued     600 mg Oral Every 12 hours 06/13/17 1427 06/15/17 1333   06/12/17 1354  bacitracin 50,000 Units in sodium chloride irrigation 0.9 % 500 mL irrigation  Status:  Discontinued       As needed 06/12/17 1355 06/12/17 1430   05/31/17 1000  azithromycin (ZITHROMAX) 200 MG/5ML suspension 1,200 mg     1,200 mg Per Tube Weekly 05/24/17  1315     05/30/17 1130  linezolid (ZYVOX) IVPB 600 mg  Status:  Discontinued     600 mg 300 mL/hr over 60 Minutes Intravenous Every 12 hours 05/30/17 1127 06/12/17 1656   05/26/17 1215  doxycycline (VIBRAMYCIN) 100 mg in dextrose 5 % 250 mL IVPB  Status:  Discontinued     100 mg 125 mL/hr over 120 Minutes Intravenous Every 12 hours 05/26/17 1212 06/01/17 1448   05/26/17 0554  cefTRIAXone (ROCEPHIN) 2 g in dextrose 5 % 50 mL IVPB  Status:  Discontinued     2 g 100 mL/hr over 30 Minutes Intravenous Every 24 hours 05/25/17 1358 05/31/17 1323   05/24/17 1230  azithromycin (ZITHROMAX) 1,200 mg in dextrose 5 % 250 mL IVPB     1,200 mg 250 mL/hr over 60 Minutes Intravenous Weekly 05/24/17 1128 05/24/17 1407   05/21/17 0800  emtricitabine-rilpivir-tenofovir AF (ODEFSEY) 200-25-25 MG per tablet 1 tablet  Status:  Discontinued     1 tablet Oral Daily with breakfast 05/20/17 1813 05/20/17 1909   05/21/17 0600  vancomycin (VANCOCIN) IVPB 1000 mg/200 mL premix  Status:  Discontinued     1,000 mg 200 mL/hr over 60 Minutes Intravenous Every 8 hours 05/21/17 0549 05/21/17 1400   05/21/17 0545  cefTRIAXone (ROCEPHIN) 2 g in dextrose 5 % 50 mL IVPB  Status:  Discontinued     2 g 100 mL/hr over 30 Minutes Intravenous Every 12 hours 05/21/17 0531 05/25/17 1358   05/21/17 0100  cefTRIAXone (ROCEPHIN) 2 g in dextrose 5 % 50 mL IVPB  Status:  Discontinued     2 g 100 mL/hr over 30 Minutes Intravenous Every 12 hours 05/20/17 1245 05/20/17 1919   05/20/17 2100  vancomycin (VANCOCIN) IVPB 1000 mg/200 mL premix  Status:  Discontinued     1,000 mg 200 mL/hr over 60 Minutes Intravenous Every 8 hours 05/20/17 1245 05/20/17 1918   05/20/17 2000  ampicillin (OMNIPEN) 1 g in sodium chloride 0.9 % 50 mL IVPB  Status:  Discontinued     1 g 150 mL/hr over 20 Minutes Intravenous Every 4 hours 05/20/17 1833 05/20/17 1836   05/20/17 2000  ampicillin (OMNIPEN) 2 g in sodium chloride 0.9 % 50 mL IVPB  Status:  Discontinued       2 g 150 mL/hr over 20 Minutes Intravenous Every 4 hours 05/20/17 1836 05/20/17 1919   05/20/17 2000  sulfamethoxazole-trimethoprim (BACTRIM,SEPTRA) 200-40 MG/5ML suspension 10 mL  Status:  Discontinued     10 mL Per Tube Daily 05/20/17 1901 06/16/17 1541   05/20/17 2000  emtricitabine-tenofovir AF (DESCOVY) 200-25 MG per tablet 1 tablet  Status:  Discontinued     1 tablet Oral Daily 05/20/17 1909 05/20/17 1919   05/20/17 2000  rilpivirine (EDURANT) tablet 25 mg  Status:  Discontinued     25 mg Per Tube Daily with breakfast 05/20/17 1909 05/20/17 1919   05/20/17 2000  flucytosine (ANCOBON) capsule 1,500 mg  Status:  Discontinued     1,500 mg Per Tube Every 6 hours 05/20/17 1926 06/16/17  1541   05/20/17 1815  dolutegravir (TIVICAY) tablet 50 mg  Status:  Discontinued     50 mg Oral Daily 05/20/17 1813 05/20/17 1919   05/20/17 1815  sulfamethoxazole-trimethoprim (BACTRIM,SEPTRA) 400-80 MG per tablet 1 tablet  Status:  Discontinued     1 tablet Oral Daily 05/20/17 1813 05/20/17 1901   05/20/17 1800  flucytosine (ANCOBON) capsule 1,500 mg  Status:  Discontinued     25 mg/kg  63.5 kg Per Tube Every 6 hours 05/20/17 1645 05/20/17 1926   05/20/17 1700  amphotericin B liposome (AMBISOME) 250 mg in dextrose 5 % 500 mL IVPB  Status:  Discontinued     4 mg/kg  63.5 kg 250 mL/hr over 120 Minutes Intravenous Every 24 hours 05/20/17 1645 06/19/17 1045   05/20/17 1300  ampicillin (OMNIPEN) 2 g in sodium chloride 0.9 % 50 mL IVPB  Status:  Discontinued     2 g 150 mL/hr over 20 Minutes Intravenous  Once 05/20/17 1237 05/20/17 1242   05/20/17 1300  acyclovir (ZOVIRAX) 635 mg in dextrose 5 % 100 mL IVPB  Status:  Discontinued     10 mg/kg  63.5 kg 112.7 mL/hr over 60 Minutes Intravenous Every 8 hours 05/20/17 1243 05/20/17 1918   05/20/17 1245  cefTRIAXone (ROCEPHIN) 2 g in dextrose 5 % 50 mL IVPB     2 g 100 mL/hr over 30 Minutes Intravenous  Once 05/20/17 1237 05/20/17 1327   05/20/17 1245   vancomycin (VANCOCIN) IVPB 1000 mg/200 mL premix     1,000 mg 200 mL/hr over 60 Minutes Intravenous  Once 05/20/17 1237 05/20/17 1420       Objective: Vitals:   06/20/17 1747 06/20/17 2214 06/21/17 0529 06/21/17 1029  BP: 140/86 101/68 100/71 103/69  Pulse: (!) 154 (!) 110 (!) 105 (!) 116  Resp: 19 20 20 20   Temp: 98.7 F (37.1 C) 98.2 F (36.8 C) 99.1 F (37.3 C) 97.7 F (36.5 C)  TempSrc: Oral Oral Oral Oral  SpO2: 100% 93% 100% 98%  Weight:      Height:        Intake/Output Summary (Last 24 hours) at 06/21/17 1651 Last data filed at 06/21/17 0500  Gross per 24 hour  Intake           1061.5 ml  Output             1550 ml  Net           -488.5 ml   Filed Weights   06/18/17 0500 06/19/17 0254 06/20/17 0521  Weight: 55.6 kg (122 lb 9.2 oz) 64.3 kg (141 lb 11.2 oz) 65 kg (143 lb 3.2 oz)    Examination: General exam: Appears comfortable - alert and restless today HEENT: PERRLA, oral mucosa moist, no sclera icterus or thrush Respiratory system: Clear to auscultation. Respiratory effort normal. Cardiovascular system: S1 & S2 heard, RRR.  No murmurs  Gastrointestinal system: Abdomen soft, non-tender, nondistended. Normal bowel sound. No organomegaly Central nervous system: Alert - No focal neurological deficits. Extremities: No cyanosis, clubbing or edema Skin: No rashes or ulcers Psychiatry:  Minimally verbal    Data Reviewed: I have personally reviewed following labs and imaging studies  CBC:  Recent Labs Lab 06/15/17 0709 06/16/17 0423 06/17/17 0530 06/18/17 0304 06/19/17 0447  WBC 1.9* 1.8* 3.1* 2.1* 3.1*  NEUTROABS 1.0* 0.8* 1.5* 1.1* 1.8  HGB 9.5* 9.6* 10.1* 9.7* 10.1*  HCT 29.2* 29.2* 30.0* 29.4* 30.2*  MCV 89.8 89.6 88.0 87.0 86.0  PLT 290 274 280 285 305   Basic Metabolic Panel:  Recent Labs Lab 06/17/17 0530 06/18/17 0304 06/19/17 0447 06/20/17 0422 06/20/17 0431 06/21/17 0500  NA 138 137 136  --  134* 134*  K 4.8 3.9 4.2  --  4.3 3.8    CL 109 109 109  --  104 102  CO2 23 24 22   --  23 20*  GLUCOSE 85 86 93  --  90 103*  BUN 7 7 7   --  10 14  CREATININE 0.91 0.81 0.90  --  0.96 0.91  CALCIUM 9.3 8.7* 9.0  --  9.0 9.3  MG 1.7 1.9 1.9 2.2  --  1.8  PHOS 7.0* 6.8* 5.5*  --  5.9* 5.2*   GFR: Estimated Creatinine Clearance: 113.1 mL/min (by C-G formula based on SCr of 0.91 mg/dL). Liver Function Tests:  Recent Labs Lab 06/16/17 0423  06/17/17 0530 06/18/17 0304 06/19/17 0447 06/20/17 0431 06/21/17 0500  AST 68*  --   --   --  96*  --   --   ALT 72*  --   --   --  92*  --   --   ALKPHOS 142*  --   --   --  169*  --   --   BILITOT 0.4  --   --   --  0.5  --   --   PROT 7.6  --   --   --  7.8  --   --   ALBUMIN 2.8*  < > 2.9* 2.7* 2.9* 2.9* 3.1*  < > = values in this interval not displayed. No results for input(s): LIPASE, AMYLASE in the last 168 hours. No results for input(s): AMMONIA in the last 168 hours. Coagulation Profile:  Recent Labs Lab 06/16/17 0423  INR 0.92   Cardiac Enzymes: No results for input(s): CKTOTAL, CKMB, CKMBINDEX, TROPONINI in the last 168 hours. BNP (last 3 results) No results for input(s): PROBNP in the last 8760 hours. HbA1C: No results for input(s): HGBA1C in the last 72 hours. CBG:  Recent Labs Lab 06/19/17 2158 06/20/17 0525 06/20/17 1403 06/20/17 2204 06/21/17 0605  GLUCAP 111* 87 89 87 97   Lipid Profile: No results for input(s): CHOL, HDL, LDLCALC, TRIG, CHOLHDL, LDLDIRECT in the last 72 hours. Thyroid Function Tests: No results for input(s): TSH, T4TOTAL, FREET4, T3FREE, THYROIDAB in the last 72 hours. Anemia Panel: No results for input(s): VITAMINB12, FOLATE, FERRITIN, TIBC, IRON, RETICCTPCT in the last 72 hours. Urine analysis:    Component Value Date/Time   COLORURINE YELLOW 05/20/2017 1742   APPEARANCEUR CLEAR 05/20/2017 1742   LABSPEC 1.020 05/20/2017 1742   PHURINE 7.0 05/20/2017 1742   GLUCOSEU NEGATIVE 05/20/2017 1742   HGBUR NEGATIVE 05/20/2017  1742   BILIRUBINUR NEGATIVE 05/20/2017 1742   KETONESUR NEGATIVE 05/20/2017 1742   PROTEINUR NEGATIVE 05/20/2017 1742   UROBILINOGEN 4.0 (H) 09/09/2014 1055   NITRITE NEGATIVE 05/20/2017 1742   LEUKOCYTESUR NEGATIVE 05/20/2017 1742   Sepsis Labs: @LABRCNTIP (procalcitonin:4,lacticidven:4) )No results found for this or any previous visit (from the past 240 hour(s)).       Radiology Studies: No results found.    Scheduled Meds: . azithromycin  1,200 mg Per Tube Weekly  . busPIRone  5 mg Oral BID  . chlorhexidine  15 mL Mouth Rinse BID  . Chlorhexidine Gluconate Cloth  6 each Topical Daily  . feeding supplement (ENSURE ENLIVE)  237 mL Oral TID BM  . fluconazole  400  mg Oral Daily  . free water  200 mL Oral Q8H  . insulin aspart  2-6 Units Subcutaneous Q8H  . levETIRAcetam  1,000 mg Oral BID  . mouth rinse  15 mL Mouth Rinse q12n4p  . metoprolol tartrate  5 mg Intravenous Q8H  . sulfamethoxazole-trimethoprim  10 mL Per Tube Once per day on Mon Wed Fri  . tamsulosin  0.4 mg Oral Daily   Continuous Infusions: . sodium chloride Stopped (06/05/17 0200)     LOS: 32 days    Time spent in minutes: 35    Calvert Cantor, MD [facility] Pager: [email or web address] Password [facility] 06/21/2017, 4:51 PM

## 2017-06-21 NOTE — Progress Notes (Signed)
Physical Therapy Treatment Patient Details Name: Justin Ball MRN: [id number] DOB: 03/23/1991 Today's Date: 06/21/2017    History of Present Illness Pt is a 26 year old man admitted s/p seizure activity having several days of recent headaches. 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 EVD was placed after acute decompensation on 5/28 with decorticate posturing and asymmetric pupils. 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 PMH includes: syphilis, substance abuse, soft palate ulceration, scabies, neuromuscular disorder, HIV/AIDS, depression, bipolar disorder, anxiety    PT Comments    PT returned with second person to ambulate this PM.  PT still needs two person assist to help maintain safety and support during gait.  EVA walker used again, but difficult to control his pelvis rotation and feet alignment.  May switch to one person in front and one behind without assistive device next session.   Follow Up Recommendations  CIR     Equipment Recommendations  Wheelchair (measurements PT);Wheelchair cushion (measurements PT);Hospital bed    Recommendations for Other Services Rehab consult     Precautions / Restrictions Precautions Precautions: Fall Precaution Comments: pt weak on his feet    Mobility  Bed Mobility Overal bed mobility: Needs Assistance Bed Mobility: Supine to Sit;Sit to Supine     Supine to sit: Min assist Sit to supine: Min assist   General bed mobility comments: Min assist to support trunk to get to sitting, min assist to get at least one foot back up into the bed when returning to supine.  Gravity helps him with his trunk.  Transfers Overall transfer level: Needs assistance Equipment used: 1 person hand held assist Transfers: Sit to/from [employer] Sit to Stand: Min assist Stand pivot transfers: Min assist       General transfer comment: Min assis to stand EOB to  support trunk during transition for both balance and to power up over weak legs.   Ambulation/Gait Ambulation/Gait assistance: +2 physical assistance;Mod assist Ambulation Distance (Feet): 45 Feet Assistive device:  (EVA walker) Gait Pattern/deviations: Step-through pattern;Scissoring;Drifts right/left;Narrow base of support Gait velocity: decreased Gait velocity interpretation: Below normal speed for age/gender General Gait Details: Pt with narrow BOS, drifts to the left  with his pevis truned left as well.  Worked best with one person in front helping him steer EVA walker (with breaks on to control speed) and one person behind to keep feet apart and pelivs rotated forward.  Pt started to have a BM, so gait terminated and pt returned to room in recliner chair to get cleaned up.  He has no ability to tell if he is having a BM.           Balance Overall balance assessment: Needs assistance Sitting-balance support: Feet supported Sitting balance-Leahy Scale: Fair     Standing balance support: Bilateral upper extremity supported Standing balance-Leahy Scale: Poor                              Cognition Arousal/Alertness: Awake/alert Behavior During Therapy: Restless;Flat affect Overall Cognitive Status: Impaired/Different from baseline Area of Impairment: Orientation;Attention;Memory;Following commands;Safety/judgement;Awareness;Problem solving                 Orientation Level: Disoriented to;Place;Time;Situation Current Attention Level: Sustained Memory: Decreased recall of precautions;Decreased short-term memory Following Commands: Follows one step commands consistently;Follows one step commands with increased time Safety/Judgement: Decreased awareness of deficits;Decreased awareness of safety Awareness: Intellectual Problem Solving: Slow processing;Decreased initiation;Difficulty  sequencing;Requires verbal cues;Requires tactile cues General Comments: Pt  remembers me from earlier, but cannot remember, Bjorn LoserRhonda who walked with him on Monday.          General Comments General comments (skin integrity, edema, etc.): Pt continues to be restless, asking questions about his mom and grandmother.       Pertinent Vitals/Pain Pain Assessment: No/denies pain           PT Goals (current goals can now be found in the care plan section) Acute Rehab PT Goals Patient Stated Goal: to go home today Progress towards PT goals: Progressing toward goals    Frequency    Min 3X/week      PT Plan Current plan remains appropriate       AM-PAC PT "6 Clicks" Daily Activity  Outcome Measure  Difficulty turning over in bed (including adjusting bedclothes, sheets and blankets)?: None Difficulty moving from lying on back to sitting on the side of the bed? : Total Difficulty sitting down on and standing up from a chair with arms (e.g., wheelchair, bedside commode, etc,.)?: Total Help needed moving to and from a bed to chair (including a wheelchair)?: A Little Help needed walking in hospital room?: A Lot Help needed climbing 3-5 steps with a railing? : A Lot 6 Click Score: 13    End of Session Equipment Utilized During Treatment: Gait belt Activity Tolerance: Patient tolerated treatment well Patient left: in bed;with call bell/phone within reach;with bed alarm set Nurse Communication: Mobility status PT Visit Diagnosis: Other symptoms and signs involving the nervous system (R29.898);Muscle weakness (generalized) (M62.81);Difficulty in walking, not elsewhere classified (R26.2)     Time: 1415-1450 PT Time Calculation (min) (ACUTE ONLY): 35 min  Charges:  $Gait Training: 8-22 mins $Therapeutic Activity: 8-22 mins          Diontay Rosencrans B. Sintia Mckissic, PT, DPT [phone]            06/21/2017, 4:21 PM

## 2017-06-21 NOTE — NC FL2 (Signed)
[place] MEDICAID FL2 LEVEL OF CARE SCREENING TOOL     IDENTIFICATION  Patient Name: Justin Ball Birthdate: 01/23/1991 Sex: male Admission Date (Current Location): 05/20/2017  [facility] [place]   Facility and Address:  [facility], [address], [place], [place] [address]      Provider Number: [id number]  Attending Physician Name and Address:  Calvert Cantorizwan, Saima, MD  Relative Name and Phone Number:       Current Level of Care: Hospital Recommended Level of Care: Skilled Nursing Facility Prior Approval Number:    Date Approved/Denied:   PASRR Number: Manual review  Discharge Plan: SNF    Current Diagnoses: Patient Active Problem List   Diagnosis Date Noted  . Patient has nasogastric tube   . History of syphilis   . Tobacco abuse   . Tachypnea   . Encephalopathy acute   . Meningoencephalitis   . Respiratory failure (HCC)   . Headache 05/20/2017  . Seizure (HCC) 05/20/2017  . Leukopenia 03/06/2017  . Peri-rectal abscess 03/06/2017  . 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 Ulcer of soft palate 09/12/2016  . Acute hypokalemia 09/12/2016  . Anemia 09/12/2016  . Alcohol use 09/12/2016  . Oral abscess 09/12/2016  . Abscess   . AIDS (HCC) 01/19/2015  . 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 H/O noncompliance with medical treatment - finincial, presenting hazards to health 12/28/2014  . Rectal abscess 12/28/2014  . Intersphincteric abscess 12/28/2014  . Bipolar disorder (HCC) 04/08/2013  . Muscle ache 01/06/2013  . Lower back pain 01/06/2013  . Insomnia 01/06/2013  . HIV (human immunodeficiency virus infection) (HCC) 10/10/2012  . Rash and nonspecific skin eruption 10/10/2012  . Rectal discharge 10/10/2012  . Syphilis 10/10/2012    Orientation RESPIRATION BLADDER Height & Weight     Self, Place  Normal Indwelling catheter Weight: 143 lb 3.2 oz (65 kg) Height:  6\' 1"  (185.4 cm)  BEHAVIORAL SYMPTOMS/MOOD  NEUROLOGICAL BOWEL NUTRITION STATUS   (Restless, Anxious, Agitated) Convulsions/Seizures Incontinent (Flexiseal)    AMBULATORY STATUS COMMUNICATION OF NEEDS Skin   Extensive Assist Verbally Skin abrasions, Other (Comment), Surgical wounds, Bruising (Blister, catheter entry/exit. Open/dehisced wound/incisoin: Left elbow (foam).)                       Personal Care Assistance Level of Assistance  Bathing, Feeding, Dressing Bathing Assistance: Maximum assistance Feeding assistance: Maximum assistance Dressing Assistance: Maximum assistance     Functional Limitations Info  Sight, Hearing, Speech Sight Info: Adequate Hearing Info: Adequate Speech Info: Adequate    SPECIAL CARE FACTORS FREQUENCY  PT (By licensed PT), Restraints, OT (By licensed OT), Speech therapy     PT Frequency: 5 x week OT Frequency: 5 x week     Speech Therapy Frequency: 5 x week Restraints Frequency: Wrist restraints, mittens, side rails, sitter: Will have dc'ed for 24 hours prior to discharge    Contractures Contractures Info: Not present    Additional Factors Info  Code Status, Allergies, Psychotropic, Insulin Sliding Scale Code Status Info: Full Allergies Info: NKDA Psychotropic Info: Buspar, 5mg  2x/day Insulin Sliding Scale Info: Every 8 hours       Current Medications (06/21/2017):  This is the current hospital active medication list Current Facility-Administered Medications  Medication Dose Route Frequency Provider Last Rate Last Dose  . 0.9 %  sodium chloride infusion  250 mL Intravenous PRN Wilber OliphantPanchal, Amar, MD   Stopped at 06/05/17 0200  .  acetaminophen (TYLENOL) tablet 650 mg  650 mg Oral Q4H PRN Wilber Oliphant, MD   650 mg at 06/21/17 2956  . azithromycin (ZITHROMAX) 200 MG/5ML suspension 1,200 mg  1,200 mg Per Tube Weekly Judyann Munson, MD   1,200 mg at 06/21/17 1049  . busPIRone (BUSPAR) tablet 5 mg  5 mg Oral BID Rhetta Mura, MD   5 mg at 06/21/17 1050  . chlorhexidine  (PERIDEX) 0.12 % solution 15 mL  15 mL Mouth Rinse BID Roslynn Amble, MD   15 mL at 06/21/17 1051  . Chlorhexidine Gluconate Cloth 2 % PADS 6 each  6 each Topical Daily Leslye Peer, MD   6 each at 06/20/17 1601  . feeding supplement (ENSURE ENLIVE) (ENSURE ENLIVE) liquid 237 mL  237 mL Oral TID BM Rizwan, Saima, MD   237 mL at 06/21/17 1051  . fluconazole (DIFLUCAN) tablet 400 mg  400 mg Oral Daily Judyann Munson, MD   400 mg at 06/21/17 1050  . free water 200 mL  200 mL Oral Q8H Samtani, Jai-Gurmukh, MD   200 mL at 06/20/17 2200  . insulin aspart (novoLOG) injection 2-6 Units  2-6 Units Subcutaneous Q8H Samtani, Jai-Gurmukh, MD      . levETIRAcetam (KEPPRA) tablet 1,000 mg  1,000 mg Oral BID Rhetta Mura, MD   1,000 mg at 06/21/17 1050  . MEDLINE mouth rinse  15 mL Mouth Rinse q12n4p Roslynn Amble, MD   15 mL at 06/21/17 1200  . metoprolol tartrate (LOPRESSOR) injection 2.5-5 mg  2.5-5 mg Intravenous Q3H PRN Kalman Shan, MD   5 mg at 05/31/17 1222  . metoprolol tartrate (LOPRESSOR) injection 5 mg  5 mg Intravenous Q8H Deterding, Dorise Hiss, MD   5 mg at 06/20/17 2216  . sodium chloride flush (NS) 0.9 % injection 10-40 mL  10-40 mL Intracatheter PRN Calvert Cantor, MD   30 mL at 06/21/17 0541  . sulfamethoxazole-trimethoprim (BACTRIM,SEPTRA) 200-40 MG/5ML suspension 10 mL  10 mL Per Tube Once per day on Mon Wed Fri Cliffton Asters, MD   10 mL at 06/20/17 1217  . tamsulosin (FLOMAX) capsule 0.4 mg  0.4 mg Oral Daily Opyd, Lavone Neri, MD   0.4 mg at 06/21/17 1050     Discharge Medications: Please see discharge summary for a list of discharge medications.  Relevant Imaging Results:  Relevant Lab Results:   Additional Information SS#: [id number]  Baldemar Lenis, LCSW

## 2017-06-21 NOTE — Progress Notes (Addendum)
[facility]    Date of Admission:  05/20/2017   Total days of antibiotics 33        Day 3 fluconazole                Day 33 oi proph   ID: Justin Ball is a 26 y.o. 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  Principal Problem:   Seizure (HCC) Active Problems:   HIV (human immunodeficiency virus infection) (HCC)   Rash and nonspecific skin eruption   Insomnia   Bipolar disorder (HCC)   Intersphincteric abscess   AIDS (HCC)   Headache   Meningoencephalitis   Respiratory failure (HCC)   Encephalopathy acute   Patient has nasogastric tube   History of syphilis   Tobacco abuse   Tachypnea    Subjective: Had urinary retention yesterday requiring I/O cath. Continues to work with PT  Labs: fungal cx since 6/15 NGTD at day 10  Medications:  . azithromycin  1,200 mg Per Tube Weekly  . busPIRone  5 mg Oral BID  . chlorhexidine  15 mL Mouth Rinse BID  . Chlorhexidine Gluconate Cloth  6 each Topical Daily  . feeding supplement (ENSURE ENLIVE)  237 mL Oral TID BM  . fluconazole  400 mg Oral Daily  . free water  200 mL Oral Q8H  . insulin aspart  2-6 Units Subcutaneous Q8H  . levETIRAcetam  1,000 mg Oral BID  . mouth rinse  15 mL Mouth Rinse q12n4p  . metoprolol tartrate  5 mg Intravenous Q8H  . sulfamethoxazole-trimethoprim  10 mL Per Tube Once per day on Mon Wed Fri  . tamsulosin  0.4 mg Oral Daily    Objective: Vital signs in last 24 hours: Temp:  [97.7 F (36.5 C)-99.1 F (37.3 C)] 97.7 F (36.5 C) (06/28 1029) Pulse Rate:  [105-154] 116 (06/28 1029) Resp:  [19-20] 20 (06/28 1029) BP: (100-140)/(68-86) 103/69 (06/28 1029) SpO2:  [93 %-100 %] 98 % (06/28 1029) Physical Exam  Constitutional: He is oriented to person,only, " I don't know where I am, who are you?" He appears chronically ill. No distress. Bandaged covering his VP shunt scalp incision HENT: bridled dobhoff tube in place Mouth/Throat: Oropharynx is clear and  moist. No oropharyngeal exudate.  Cardiovascular: Normal rate, regular rhythm and normal heart sounds. Exam reveals no gallop and no friction rub.  No murmur heard.  Pulmonary/Chest: Effort normal and breath sounds normal. No respiratory distress. He has no wheezes.  Abdominal: Soft. Bowel sounds are normal. He exhibits no distension. There is no tenderness.  Neurological: He is alert and oriented to self.he remembers that he has HIV, and medications he takes. Still cognitive slowing Skin: Slight erythema to staples on chest, picc line dressing is becoming undone Psychiatric: He has a normal mood and affect. His behavior is normal.     Lab Results  Recent Labs  06/19/17 0447 06/20/17 0431 06/21/17 0500  WBC 3.1*  --   --   HGB 10.1*  --   --   HCT 30.2*  --   --   NA 136 134* 134*  K 4.2 4.3 3.8  CL 109 104 102  CO2 22 23 20*  BUN 7 10 14   CREATININE 0.90 0.96 0.91   Liver Panel  Recent Labs  06/19/17 0447 06/20/17 0431 06/21/17 0500  PROT 7.8  --   --   ALBUMIN 2.9* 2.9* 3.1*  AST 96*  --   --  ALT 92*  --   --   ALKPHOS 169*  --   --   BILITOT 0.5  --   --     Microbiology: 6/15 csf - yeast on gram stain but culture aerobic negative at 7 days,(which is now discarded) ,but fungal cx is negative at 7 days (Which is held for 28d) 6/5 csf - staph epi Studies/Results: No results found.   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 I am reassured that he continues to show improvement with PT/OT.clinically his neurocognitive insult would be similar to stroke patients and would benefit from intensive PT/OT.   HIV disease= will plan to start ART over  the next few days after he has finished 5 wk of antifungal treatment, currently day 33 of treatment  oi proph = continue with daily bactrim plus weekly azithromycin  Leukopenia = stable. Repeat cbc tomorrow  Severe protein calorie malnutrition = continue to supplement food and have sitter help with feeding. Await to see what his sodium looks like once we stop ampho.    Drue Second [facility] Cell: [phone] Pager: [phone]  06/21/2017, 4:41 PM

## 2017-06-22 LAB — GLUCOSE, CAPILLARY
GLUCOSE-CAPILLARY: 87 mg/dL (ref 65–99)
Glucose-Capillary: 81 mg/dL (ref 65–99)
Glucose-Capillary: 83 mg/dL (ref 65–99)
Glucose-Capillary: 111 mg/dL — ABNORMAL HIGH (ref 65–99)

## 2017-06-22 LAB — RENAL FUNCTION PANEL
ALBUMIN: 3.1 g/dL — AB (ref 3.5–5.0)
ANION GAP: 7 (ref 5–15)
BUN: 18 mg/dL (ref 6–20)
CHLORIDE: 102 mmol/L (ref 101–111)
CO2: 23 mmol/L (ref 22–32)
Calcium: 9 mg/dL (ref 8.9–10.3)
Creatinine, Ser: 1.01 mg/dL (ref 0.61–1.24)
Glucose, Bld: 96 mg/dL (ref 65–99)
PHOSPHORUS: 5.5 mg/dL — AB (ref 2.5–4.6)
POTASSIUM: 3.9 mmol/L (ref 3.5–5.1)
Sodium: 132 mmol/L — ABNORMAL LOW (ref 135–145)

## 2017-06-22 LAB — CBC
HEMATOCRIT: 30.9 % — AB (ref 39.0–52.0)
HEMOGLOBIN: 10.5 g/dL — AB (ref 13.0–17.0)
MCH: 29.4 pg (ref 26.0–34.0)
MCHC: 34 g/dL (ref 30.0–36.0)
MCV: 86.6 fL (ref 78.0–100.0)
Platelets: 280 10*3/uL (ref 150–400)
RBC: 3.57 MIL/uL — AB (ref 4.22–5.81)
RDW: 13.6 % (ref 11.5–15.5)
WBC: 2.7 10*3/uL — AB (ref 4.0–10.5)

## 2017-06-22 LAB — MAGNESIUM: Magnesium: 1.5 mg/dL — ABNORMAL LOW (ref 1.7–2.4)

## 2017-06-22 MED ORDER — MAGNESIUM SULFATE 2 GM/50ML IV SOLN
2.0000 g | Freq: Once | INTRAVENOUS | Status: AC
  Administered 2017-06-22: 2 g via INTRAVENOUS
  Filled 2017-06-22: qty 50

## 2017-06-22 MED ORDER — QUETIAPINE FUMARATE 25 MG PO TABS
25.0000 mg | ORAL_TABLET | Freq: Two times a day (BID) | ORAL | Status: DC
  Administered 2017-06-22 – 2017-06-27 (×10): 25 mg via ORAL
  Filled 2017-06-22 (×10): qty 1

## 2017-06-22 NOTE — Progress Notes (Signed)
Physical Therapy Treatment Patient Details Name: Justin Ball R Karas MRN: [id number] DOB: 02/26/1991 Today's Date: 06/22/2017    History of Present Illness Pt is a 26 year old man admitted s/p seizure activity having several days of recent headaches. 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 EVD was placed after acute decompensation on 5/28 with decorticate posturing and asymmetric pupils. 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 PMH includes: syphilis, substance abuse, soft palate ulceration, scabies, neuromuscular disorder, HIV/AIDS, depression, bipolar disorder, anxiety    PT Comments    Pt did much better walking without AD and just putting his hands on therapist shoulders vs using RW or EVA walker.  Pt was able to go further during gait and mom was present to see his progress and she helped with the IV line.  He continues to have poor safety awareness and is restless being in the bed all day.  PT will continue to follow acutely to progress gait and mobility.   Follow Up Recommendations  CIR     Equipment Recommendations  Wheelchair (measurements PT);Wheelchair cushion (measurements PT);Hospital bed    Recommendations for Other Services Rehab consult     Precautions / Restrictions Precautions Precautions: Fall Precaution Comments: pt weak on his feet Restrictions Weight Bearing Restrictions: No    Mobility  Bed Mobility Overal bed mobility: Needs Assistance Bed Mobility: Supine to Sit;Sit to Supine     Supine to sit: Min assist Sit to supine: Mod assist   General bed mobility comments: Min assist to provide hand to pull up to sitting EOB.  Mod assist to lift legs back into bed (not because he could not, but he decided not to do so himself).   Transfers Overall transfer level: Needs assistance Equipment used: 1 person hand held assist Transfers: Sit to/from Stand Sit to Stand: Min assist          General transfer comment: Min assist to stand EOB and to support trunk during transition, verbal cues to uncross legs and get feet underneath him before standing and for safety when backing up to sitting surface (he wants to sit before he is there).   Ambulation/Gait Ambulation/Gait assistance: +2 physical assistance;Mod assist Ambulation Distance (Feet): 150 Feet Assistive device: 1 person hand held assist Gait Pattern/deviations: Step-through pattern;Scissoring;Staggering left;Narrow base of support Gait velocity: decreased   General Gait Details: One person in front of pt with both of pt's hands on therapist's shoulders, second person behind pt verbal and manual assist to keep feet apart.  One significant LOB to the left with mod assist to recover, otherwise, most of gait was min assist.           Balance Overall balance assessment: Needs assistance Sitting-balance support: Feet supported;Bilateral upper extremity supported;No upper extremity supported Sitting balance-Leahy Scale: Fair     Standing balance support: Bilateral upper extremity supported Standing balance-Leahy Scale: Poor                              Cognition Arousal/Alertness: Awake/alert Behavior During Therapy: Flat affect Overall Cognitive Status: Impaired/Different from baseline Area of Impairment: Attention;Memory;Safety/judgement;Following commands;Awareness;Problem solving                 Orientation Level:  (knew his mom, could not remember walking with me) Current Attention Level: Sustained Memory: Decreased recall of precautions;Decreased short-term memory Following Commands: Follows one step commands consistently;Follows one step commands with increased time Safety/Judgement: Decreased  awareness of safety;Decreased awareness of deficits Awareness: Intellectual Problem Solving: Slow processing;Decreased initiation;Difficulty sequencing;Requires tactile cues;Requires verbal  cues General Comments: Pt with flat affect today during mom's visit, did not remember what we did together yesterday when asked, but did remember the name we decided to call his stuffed bear.           General Comments General comments (skin integrity, edema, etc.): Mom "Boneta Lucks" pulling IV pole for [place] during gait.  Pt almost had a BM in the hallway, does not know he is going.  He was able to preform his own peri care with OT checking for thoroughness when helped to the bathroom to see if he could complete his BM.       Pertinent Vitals/Pain Pain Assessment: Faces Faces Pain Scale: Hurts little more Pain Location: back with ambulation Pain Descriptors / Indicators: Discomfort;Grimacing Pain Intervention(s): Monitored during session;Limited activity within patient's tolerance;Repositioned           PT Goals (current goals can now be found in the care plan section) Acute Rehab PT Goals Patient Stated Goal: none stated Progress towards PT goals: Progressing toward goals    Frequency    Min 3X/week      PT Plan Current plan remains appropriate    Co-evaluation PT/OT/SLP Co-Evaluation/Treatment: Yes Reason for Co-Treatment: Necessary to address cognition/behavior during functional activity;Complexity of the patient's impairments (multi-system involvement);For patient/therapist safety;To address functional/ADL transfers PT goals addressed during session: Mobility/safety with mobility;Balance;Strengthening/ROM OT goals addressed during session: ADL's and self-care      AM-PAC PT "6 Clicks" Daily Activity  Outcome Measure  Difficulty turning over in bed (including adjusting bedclothes, sheets and blankets)?: None Difficulty moving from lying on back to sitting on the side of the bed? : Total Difficulty sitting down on and standing up from a chair with arms (e.g., wheelchair, bedside commode, etc,.)?: Total Help needed moving to and from a bed to chair (including a wheelchair)?:  A Little Help needed walking in hospital room?: A Lot Help needed climbing 3-5 steps with a railing? : A Lot 6 Click Score: 13    End of Session Equipment Utilized During Treatment: Gait belt Activity Tolerance: Patient tolerated treatment well Patient left: in bed;with call bell/phone within reach;with bed alarm set;with family/visitor present Nurse Communication: Mobility status PT Visit Diagnosis: Other symptoms and signs involving the nervous system (R29.898);Muscle weakness (generalized) (M62.81);Difficulty in walking, not elsewhere classified (R26.2)     Time: 1610-9604 PT Time Calculation (min) (ACUTE ONLY): 24 min  Charges:  $Gait Training: 8-22 mins          Zamarah Ullmer B. Leiam Hopwood, PT, DPT [phone]           06/22/2017, 3:56 PM

## 2017-06-22 NOTE — Progress Notes (Signed)
PROGRESS NOTE    Justin Ball   WUJ:[id number]  DOB: [birth year]  DOA: 05/20/2017 PCP: Randall Hiss, MD   Brief Narrative:   26 y/o with AIDS, Syphilis, Chronic Hep B, substance abuse, cigarette smoker, Bipolar disorder and poor compliance with treatment presented to ER on 5/27 intially unresponsive and then noted to be having seizures int he ER. He had been seen in the ER 3 day prior for a headache, underwent a CT of the head which was unrevealing and was discharged from the ER. Apparently he was also having fevers at home. 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 This has been treated surgically with a VP shunt (6/19 by Dr Rolanda Jay) and medically treated with Flucytosine which was switched to Amphotericin due to leukopenia. Amphotericin transitioned to Fluconazole on 6/26.  Seizures being managed with Keppra.  Further complications included Strep pneumo bacteremia, metabolic encephalopathy, dysphagia requiring nutrition via NG tube CD4 count is 10 and he is maintained on Bactrim prophylaxis.    Subjective: Restless again today. Per notes in computer, he fell out of the bed and was found on the floor. Needs a bedside sitter which has not been available.    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 Rolanda Jay 6/19 - S/p ventriculostomy 5/28-6/8.  - Keppra per neurology for seizure   - Bactrim changed to 3 times weekly 6/23 - EVD removed 6/8  - LP repeated 6/12 with opening pressure - LP repeated 6/15 with opening pressure  - Continue treatment per ID- now on Fluconazole  U retention - voiding trial 6/25 but had to be recatheterized 6/26 - failed voiding trail again - foley replaced- I have spoken with Dr Annabell Howells, urology,  for input- he  has reviewed the chart and feels that the patient has a bladder stretch injury and is recommending to maintain the foley cath- he further recommends to change foley on a monthly basis- he can follow up in the office with urology after he is discharged per Dr Wilson Singer, he may need a supra-pubic catheter  Dysphagia secondary to acute metabolic encephalopathy   - underewent speech therapy cognitive eval  - NG tube removed - oral intake is quite variable - cognitive status is limiting him  Acute encephalopathy:   - secondary to meningoencephalitis, seizures, ischemic infarcts (no antiplatelet per neuro), metabolic derangements and medications    -Numerous scattered areas of ischemia on MRI - continues to be quite agitated and impulsive- d/c Buspar which is not helping- start Seroquel - continue bedside sitter   Streptococcus pneumoniae bacteremia - has completed Linezolid per ID x14 days (6/6 - last dose on 6/21)  - TTE ordered: No vegetations noted   Bordatella pneumonia - Was treated with doxycycline per ID   AIDS - CD4 count April 2018 was 2.  - Hold ART per ID - Continue bactrim 3 x /wk and weekly azithromycin   - Starting ART to be started by ID  Neutropenia, is afebrile - Suspected due to flucytosine.  - steadily improving  Diarrhea:  - Acute, likely from tube feeds and has resolved - Flexiseal d/c 6/23  Chronic hepatitis B - Follow up with ID , LFTs minimally elevated AST ALT 90s, alkaline phosphatase 169 6/26  Malnutrition - cont to follow nutrition  - Prealbumin 6/26 is reasonable at 24  DVT  prophylaxis: SCD Code Status: Full code Family Communication: mother, grandmother Disposition Plan: SNF Consultants:    PCCM   ID  NS  Neurology Procedures:   V-P shunt  Intubation  LP  Video EEG Antimicrobials:  Anti-infectives    Start     Dose/Rate Route Frequency Ordered Stop   06/19/17 1200  fluconazole (DIFLUCAN) tablet 400 mg     400 mg Oral  Daily 06/19/17 1045     06/18/17 0900  sulfamethoxazole-trimethoprim (BACTRIM,SEPTRA) 200-40 MG/5ML suspension 10 mL     10 mL Per Tube Once per day on Mon Wed Fri 06/16/17 1541     06/13/17 1500  linezolid (ZYVOX) tablet 600 mg  Status:  Discontinued     600 mg Oral Every 12 hours 06/13/17 1427 06/15/17 1333   06/12/17 1354  bacitracin 50,000 Units in sodium chloride irrigation 0.9 % 500 mL irrigation  Status:  Discontinued       As needed 06/12/17 1355 06/12/17 1430   05/31/17 1000  azithromycin (ZITHROMAX) 200 MG/5ML suspension 1,200 mg     1,200 mg Per Tube Weekly 05/24/17 1315     05/30/17 1130  linezolid (ZYVOX) IVPB 600 mg  Status:  Discontinued     600 mg 300 mL/hr over 60 Minutes Intravenous Every 12 hours 05/30/17 1127 06/12/17 1656   05/26/17 1215  doxycycline (VIBRAMYCIN) 100 mg in dextrose 5 % 250 mL IVPB  Status:  Discontinued     100 mg 125 mL/hr over 120 Minutes Intravenous Every 12 hours 05/26/17 1212 06/01/17 1448   05/26/17 0554  cefTRIAXone (ROCEPHIN) 2 g in dextrose 5 % 50 mL IVPB  Status:  Discontinued     2 g 100 mL/hr over 30 Minutes Intravenous Every 24 hours 05/25/17 1358 05/31/17 1323   05/24/17 1230  azithromycin (ZITHROMAX) 1,200 mg in dextrose 5 % 250 mL IVPB     1,200 mg 250 mL/hr over 60 Minutes Intravenous Weekly 05/24/17 1128 05/24/17 1407   05/21/17 0800  emtricitabine-rilpivir-tenofovir AF (ODEFSEY) 200-25-25 MG per tablet 1 tablet  Status:  Discontinued     1 tablet Oral Daily with breakfast 05/20/17 1813 05/20/17 1909   05/21/17 0600  vancomycin (VANCOCIN) IVPB 1000 mg/200 mL premix  Status:  Discontinued     1,000 mg 200 mL/hr over 60 Minutes Intravenous Every 8 hours 05/21/17 0549 05/21/17 1400   05/21/17 0545  cefTRIAXone (ROCEPHIN) 2 g in dextrose 5 % 50 mL IVPB  Status:  Discontinued     2 g 100 mL/hr over 30 Minutes Intravenous Every 12 hours 05/21/17 0531 05/25/17 1358   05/21/17 0100  cefTRIAXone (ROCEPHIN) 2 g in dextrose 5 % 50 mL IVPB   Status:  Discontinued     2 g 100 mL/hr over 30 Minutes Intravenous Every 12 hours 05/20/17 1245 05/20/17 1919   05/20/17 2100  vancomycin (VANCOCIN) IVPB 1000 mg/200 mL premix  Status:  Discontinued     1,000 mg 200 mL/hr over 60 Minutes Intravenous Every 8 hours 05/20/17 1245 05/20/17 1918   05/20/17 2000  ampicillin (OMNIPEN) 1 g in sodium chloride 0.9 % 50 mL IVPB  Status:  Discontinued     1 g 150 mL/hr over 20 Minutes Intravenous Every 4 hours 05/20/17 1833 05/20/17 1836   05/20/17 2000  ampicillin (OMNIPEN) 2 g in sodium chloride 0.9 % 50 mL IVPB  Status:  Discontinued     2 g 150 mL/hr over 20 Minutes Intravenous Every 4 hours 05/20/17 1836 05/20/17 1919  05/20/17 2000  sulfamethoxazole-trimethoprim (BACTRIM,SEPTRA) 200-40 MG/5ML suspension 10 mL  Status:  Discontinued     10 mL Per Tube Daily 05/20/17 1901 06/16/17 1541   05/20/17 2000  emtricitabine-tenofovir AF (DESCOVY) 200-25 MG per tablet 1 tablet  Status:  Discontinued     1 tablet Oral Daily 05/20/17 1909 05/20/17 1919   05/20/17 2000  rilpivirine (EDURANT) tablet 25 mg  Status:  Discontinued     25 mg Per Tube Daily with breakfast 05/20/17 1909 05/20/17 1919   05/20/17 2000  flucytosine (ANCOBON) capsule 1,500 mg  Status:  Discontinued     1,500 mg Per Tube Every 6 hours 05/20/17 1926 06/16/17 1541   05/20/17 1815  dolutegravir (TIVICAY) tablet 50 mg  Status:  Discontinued     50 mg Oral Daily 05/20/17 1813 05/20/17 1919   05/20/17 1815  sulfamethoxazole-trimethoprim (BACTRIM,SEPTRA) 400-80 MG per tablet 1 tablet  Status:  Discontinued     1 tablet Oral Daily 05/20/17 1813 05/20/17 1901   05/20/17 1800  flucytosine (ANCOBON) capsule 1,500 mg  Status:  Discontinued     25 mg/kg  63.5 kg Per Tube Every 6 hours 05/20/17 1645 05/20/17 1926   05/20/17 1700  amphotericin B liposome (AMBISOME) 250 mg in dextrose 5 % 500 mL IVPB  Status:  Discontinued     4 mg/kg  63.5 kg 250 mL/hr over 120 Minutes Intravenous Every 24 hours  05/20/17 1645 06/19/17 1045   05/20/17 1300  ampicillin (OMNIPEN) 2 g in sodium chloride 0.9 % 50 mL IVPB  Status:  Discontinued     2 g 150 mL/hr over 20 Minutes Intravenous  Once 05/20/17 1237 05/20/17 1242   05/20/17 1300  acyclovir (ZOVIRAX) 635 mg in dextrose 5 % 100 mL IVPB  Status:  Discontinued     10 mg/kg  63.5 kg 112.7 mL/hr over 60 Minutes Intravenous Every 8 hours 05/20/17 1243 05/20/17 1918   05/20/17 1245  cefTRIAXone (ROCEPHIN) 2 g in dextrose 5 % 50 mL IVPB     2 g 100 mL/hr over 30 Minutes Intravenous  Once 05/20/17 1237 05/20/17 1327   05/20/17 1245  vancomycin (VANCOCIN) IVPB 1000 mg/200 mL premix     1,000 mg 200 mL/hr over 60 Minutes Intravenous  Once 05/20/17 1237 05/20/17 1420       Objective: Vitals:   06/22/17 0046 06/22/17 0414 06/22/17 0600 06/22/17 1027  BP: (!) 97/50 (!) 95/46  92/71  Pulse: 97 90  (!) 106  Resp: 18 18  20   Temp: 98.4 F (36.9 C) 98.2 F (36.8 C)  97.3 F (36.3 C)  TempSrc: Oral Oral  Oral  SpO2: 100% 93%  100%  Weight:   63 kg (139 lb)   Height:        Intake/Output Summary (Last 24 hours) at 06/22/17 1433 Last data filed at 06/22/17 0700  Gross per 24 hour  Intake                0 ml  Output             2300 ml  Net            -2300 ml   Filed Weights   06/19/17 0254 06/20/17 0521 06/22/17 0600  Weight: 64.3 kg (141 lb 11.2 oz) 65 kg (143 lb 3.2 oz) 63 kg (139 lb)    Examination: General exam: Appears comfortable -  restless again today HEENT: PERRLA, oral mucosa moist, no sclera icterus or thrush Respiratory system:  Clear to auscultation. Respiratory effort normal. Cardiovascular system: S1 & S2 heard, RRR.  No murmurs  Gastrointestinal system: Abdomen soft, non-tender, nondistended. Normal bowel sound. No organomegaly Central nervous system: Alert - No focal neurological deficits. Extremities: No cyanosis, clubbing or edema Skin: No rashes or ulcers Psychiatry:  Minimally verbal    Data Reviewed: I have  personally reviewed following labs and imaging studies  CBC:  Recent Labs Lab 06/16/17 0423 06/17/17 0530 06/18/17 0304 06/19/17 0447 06/22/17 0202  WBC 1.8* 3.1* 2.1* 3.1* 2.7*  NEUTROABS 0.8* 1.5* 1.1* 1.8  --   HGB 9.6* 10.1* 9.7* 10.1* 10.5*  HCT 29.2* 30.0* 29.4* 30.2* 30.9*  MCV 89.6 88.0 87.0 86.0 86.6  PLT 274 280 285 305 280   Basic Metabolic Panel:  Recent Labs Lab 06/18/17 0304 06/19/17 0447 06/20/17 0422 06/20/17 0431 06/21/17 0500 06/21/17 1840 06/22/17 0202  NA 137 136  --  134* 134* 134* 132*  K 3.9 4.2  --  4.3 3.8 3.8 3.9  CL 109 109  --  104 102 102 102  CO2 24 22  --  23 20* 22 23  GLUCOSE 86 93  --  90 103* 109* 96  BUN 7 7  --  10 14 14 18   CREATININE 0.81 0.90  --  0.96 0.91 0.98 1.01  CALCIUM 8.7* 9.0  --  9.0 9.3 9.3 9.0  MG 1.9 1.9 2.2  --  1.8  --  1.5*  PHOS 6.8* 5.5*  --  5.9* 5.2*  --  5.5*   GFR: Estimated Creatinine Clearance: 98.9 mL/min (by C-G formula based on SCr of 1.01 mg/dL). Liver Function Tests:  Recent Labs Lab 06/16/17 0423  06/19/17 0447 06/20/17 0431 06/21/17 0500 06/21/17 1840 06/22/17 0202  AST 68*  --  96*  --   --  117*  --   ALT 72*  --  92*  --   --  122*  --   ALKPHOS 142*  --  169*  --   --  180*  --   BILITOT 0.4  --  0.5  --   --  0.4  --   PROT 7.6  --  7.8  --   --  9.4*  --   ALBUMIN 2.8*  < > 2.9* 2.9* 3.1* 3.2* 3.1*  < > = values in this interval not displayed. No results for input(s): LIPASE, AMYLASE in the last 168 hours. No results for input(s): AMMONIA in the last 168 hours. Coagulation Profile:  Recent Labs Lab 06/16/17 0423  INR 0.92   Cardiac Enzymes: No results for input(s): CKTOTAL, CKMB, CKMBINDEX, TROPONINI in the last 168 hours. BNP (last 3 results) No results for input(s): PROBNP in the last 8760 hours. HbA1C: No results for input(s): HGBA1C in the last 72 hours. CBG:  Recent Labs Lab 06/21/17 0605 06/21/17 1656 06/21/17 2145 06/22/17 0636 06/22/17 1135  GLUCAP 97  85 101* 81 87   Lipid Profile: No results for input(s): CHOL, HDL, LDLCALC, TRIG, CHOLHDL, LDLDIRECT in the last 72 hours. Thyroid Function Tests: No results for input(s): TSH, T4TOTAL, FREET4, T3FREE, THYROIDAB in the last 72 hours. Anemia Panel: No results for input(s): VITAMINB12, FOLATE, FERRITIN, TIBC, IRON, RETICCTPCT in the last 72 hours. Urine analysis:    Component Value Date/Time   COLORURINE YELLOW 05/20/2017 1742   APPEARANCEUR CLEAR 05/20/2017 1742   LABSPEC 1.020 05/20/2017 1742   PHURINE 7.0 05/20/2017 1742   GLUCOSEU NEGATIVE 05/20/2017 1742   HGBUR NEGATIVE 05/20/2017  1742   BILIRUBINUR NEGATIVE 05/20/2017 1742   KETONESUR NEGATIVE 05/20/2017 1742   PROTEINUR NEGATIVE 05/20/2017 1742   UROBILINOGEN 4.0 (H) 09/09/2014 1055   NITRITE NEGATIVE 05/20/2017 1742   LEUKOCYTESUR NEGATIVE 05/20/2017 1742   Sepsis Labs: @LABRCNTIP (procalcitonin:4,lacticidven:4) )No results found for this or any previous visit (from the past 240 hour(s)).       Radiology Studies: No results found.    Scheduled Meds: . azithromycin  1,200 mg Per Tube Weekly  . busPIRone  5 mg Oral BID  . chlorhexidine  15 mL Mouth Rinse BID  . Chlorhexidine Gluconate Cloth  6 each Topical Daily  . feeding supplement (ENSURE ENLIVE)  237 mL Oral TID BM  . fluconazole  400 mg Oral Daily  . free water  200 mL Oral Q8H  . insulin aspart  2-6 Units Subcutaneous Q8H  . levETIRAcetam  1,000 mg Oral BID  . mouth rinse  15 mL Mouth Rinse q12n4p  . metoprolol tartrate  5 mg Intravenous Q8H  . sulfamethoxazole-trimethoprim  10 mL Per Tube Once per day on Mon Wed Fri  . tamsulosin  0.4 mg Oral Daily   Continuous Infusions: . sodium chloride Stopped (06/05/17 0200)     LOS: 33 days    Time spent in minutes: 35    Calvert Cantor, MD [facility] Pager: [email or web address] Password [facility] 06/22/2017, 2:33 PM

## 2017-06-22 NOTE — Progress Notes (Addendum)
[facility]    Date of Admission:  05/20/2017   Total days of antibiotics 34        Day 4  Fluconazole(day 34 of CM tx)                Day 34  oi proph   ID: Justin Ball is a 26 y.o. 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  Principal Problem:   Seizure (HCC) Active Problems:   HIV (human immunodeficiency virus infection) (HCC)   Rash and nonspecific skin eruption   Insomnia   Bipolar disorder (HCC)   Intersphincteric abscess   AIDS (HCC)   Headache   Meningoencephalitis   Respiratory failure (HCC)   Encephalopathy acute   Patient has nasogastric tube   History of syphilis   Tobacco abuse   Tachypnea    Subjective: afebrile Labs: fungal cx since 6/15 NGTD at day 10  Medications:  . azithromycin  1,200 mg Per Tube Weekly  . chlorhexidine  15 mL Mouth Rinse BID  . Chlorhexidine Gluconate Cloth  6 each Topical Daily  . feeding supplement (ENSURE ENLIVE)  237 mL Oral TID BM  . fluconazole  400 mg Oral Daily  . free water  200 mL Oral Q8H  . insulin aspart  2-6 Units Subcutaneous Q8H  . levETIRAcetam  1,000 mg Oral BID  . mouth rinse  15 mL Mouth Rinse q12n4p  . QUEtiapine  25 mg Oral BID  . sulfamethoxazole-trimethoprim  10 mL Per Tube Once per day on Mon Wed Fri  . tamsulosin  0.4 mg Oral Daily    Objective: Vital signs in last 24 hours: Temp:  [97.3 F (36.3 C)-99 F (37.2 C)] 98.2 F (36.8 C) (06/29 1509) Pulse Rate:  [84-111] 84 (06/29 1509) Resp:  [18-20] 20 (06/29 1509) BP: (81-98)/(46-71) 81/68 (06/29 1509) SpO2:  [93 %-100 %] 100 % (06/29 1027) Weight:  [139 lb (63 kg)] 139 lb (63 kg) (06/29 0600) Physical Exam  Constitutional: He is oriented to person,only, " He appears chronically ill. No distress. Picking at his scalp and staples over his VP shunt scalp incision Mouth/Throat: Oropharynx is clear and moist. No oropharyngeal exudate.  Neurological: He is alert and oriented to self.he  remembers that he has HIV, and medications he takes. Still cognitive slowing Skin: Slight erythema to staples on chest, picc line dressing is loose Psychiatric: He has a normal mood and affect. His behavior is normal.   Lab Results  Recent Labs  06/21/17 1840 06/22/17 0202  WBC  --  2.7*  HGB  --  10.5*  HCT  --  30.9*  NA 134* 132*  K 3.8 3.9  CL 102 102  CO2 22 23  BUN 14 18  CREATININE 0.98 1.01   Liver Panel  Recent Labs  06/21/17 1840 06/22/17 0202  PROT 9.4*  --   ALBUMIN 3.2* 3.1*  AST 117*  --   ALT 122*  --   ALKPHOS 180*  --   BILITOT 0.4  --     Microbiology: 6/15 csf - yeast on gram stain but culture aerobic negative at 7 days,(which is now discarded) ,but fungal cx is negative at 7 days (Which is held for 28d) 6/5 csf - staph epi Studies/Results: No results found.   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   HIV disease= will plan to start ART on sunday since he has 5 wk of CM treatment thus far. He remembers taking tivicay and septra but unable to remember the 2nd agent HIV med. He previously was to start [facility] but will decide this weekend if this still will be his regimen.  Chronic hep B = has some transaminitis that is slowly increasing, that may be related to hep B vs fluconazole. Hep b Viral load pending. Planning to restart on medication that will help tx hepatitis  oi proph = continue with daily bactrim plus weekly azithromycin  Leukopenia = stable.    Severe protein calorie malnutrition = continue to supplement food and have sitter help with feeding. Await to see what his sodium looks like once we stop ampho.   Dr Zenaida [facility]Van [facility] to see over the weekend   [facility], [facility] Cell: [phone] Pager: [phone]  06/22/2017, 4:44 PM

## 2017-06-22 NOTE — Progress Notes (Signed)
Writer called to patient's room while attending to another patient and told patient was found on the floor. Assisted back to bed and assessed with no apparent injury noted. Patient reminded to call for help and wait till someone comes in room to help before getting up. Will continue to monitor.

## 2017-06-22 NOTE — Progress Notes (Signed)
Occupational Therapy Treatment Patient Details Name: Justin Ball MRN: [id number] DOB: 09-May-1991 Today's Date: 06/22/2017    History of present illness Pt is a 26 year old man admitted s/p seizure activity having several days of recent headaches. 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 EVD was placed after acute decompensation on 5/28 with decorticate posturing and asymmetric pupils. 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 PMH includes: syphilis, substance abuse, soft palate ulceration, scabies, neuromuscular disorder, HIV/AIDS, depression, bipolar disorder, anxiety   OT comments  Pt making progress towards OT goals this session with improved ability to transfer for toileting perform peri care. Attention is improving. Pt did not verbalize anything to OT during session. [name] mother present for entire session. Pt continues to be excellent candidate for CIR and requires intense therapy to maximize safety and independence in ADL.   Follow Up Recommendations  CIR;Supervision/Assistance - 24 hour    Equipment Recommendations  Other (comment) (defer to next venue)    Recommendations for Other Services     Precautions / Restrictions Precautions Precautions: Fall Precaution Comments: pt weak on his feet Restrictions Weight Bearing Restrictions: No       Mobility Bed Mobility Overal bed mobility: Needs Assistance Bed Mobility: Supine to Sit;Sit to Supine     Supine to sit: Min assist Sit to supine: Mod assist   General bed mobility comments: Min assist to support trunk to get to sitting, mod assist to brung BLE back up into the bed when returning to supine.  Gravity helps him with his trunk.  Transfers Overall transfer level: Needs assistance Equipment used: 1 person hand held assist Transfers: Sit to/from Stand Sit to Stand: Min assist         General transfer comment: Min assist to stand EOB to  urinate and to support trunk during transition for both balance and to power up over weak legs.     Balance Overall balance assessment: Needs assistance Sitting-balance support: Feet supported Sitting balance-Leahy Scale: Fair (approaching good)     Standing balance support: Bilateral upper extremity supported Standing balance-Leahy Scale: Poor                             ADL either performed or assessed with clinical judgement   ADL Overall ADL's : Needs assistance/impaired     Grooming: Wash/dry hands;Set up;Sitting Grooming Details (indicate cue type and reason): Pt demonstrating increased ability to attend to task                 Toilet Transfer: Minimal assistance;+2 for safety/equipment;Ambulation;Comfort height toilet;Grab bars;Cueing for safety;Cueing for sequencing Toilet Transfer Details (indicate cue type and reason): in bathroom, BSC over toilet Toileting- Clothing Manipulation and Hygiene: Minimal assistance;Sit to/from stand Toileting - Clothing Manipulation Details (indicate cue type and reason): Pt able to perform rear peri care with A for balance,     Functional mobility during ADLs: Minimal assistance;+2 for safety/equipment;Cueing for safety;Cueing for sequencing General ADL Comments: Pt did not verbalize at all during session     Vision       Perception     Praxis      Cognition Arousal/Alertness: Awake/alert Behavior During Therapy: Flat affect Overall Cognitive Status: Impaired/Different from baseline Area of Impairment: Attention;Memory;Following commands;Safety/judgement;Awareness;Problem solving;Orientation                 Orientation Level: Disoriented to;Place;Time;Situation Current Attention Level: Sustained Memory: Decreased recall of precautions;Decreased  short-term memory Following Commands: Follows one step commands consistently;Follows one step commands with increased time Safety/Judgement: Decreased awareness  of deficits;Decreased awareness of safety Awareness: Intellectual Problem Solving: Slow processing;Decreased initiation;Difficulty sequencing;Requires verbal cues;Requires tactile cues General Comments: Pt unaware that he was passing fecal matter during ambulation        Exercises     Shoulder Instructions       General Comments [name] mother "Justin Ball" present for session    Pertinent Vitals/ Pain       Pain Assessment: Faces Faces Pain Scale: Hurts little more Pain Location: Back with ambulation Pain Descriptors / Indicators: Discomfort;Grimacing Pain Intervention(s): Monitored during session;Repositioned  Home Living                                          Prior Functioning/Environment              Frequency  Min 3X/week        Progress Toward Goals  OT Goals(current goals can now be found in the care plan section)  Progress towards OT goals: Progressing toward goals  Acute Rehab OT Goals Patient Stated Goal: none stated this session  Plan Discharge plan remains appropriate    Co-evaluation    PT/OT/SLP Co-Evaluation/Treatment: Yes Reason for Co-Treatment: Complexity of the patient's impairments (multi-system involvement);For patient/therapist safety;To address functional/ADL transfers   OT goals addressed during session: ADL's and self-care      AM-PAC PT "6 Clicks" Daily Activity     Outcome Measure   Help from another person eating meals?: A Lot Help from another person taking care of personal grooming?: A Lot Help from another person toileting, which includes using toliet, bedpan, or urinal?: A Little Help from another person bathing (including washing, rinsing, drying)?: A Lot Help from another person to put on and taking off regular upper body clothing?: A Little Help from another person to put on and taking off regular lower body clothing?: A Lot 6 Click Score: 14    End of Session Equipment Utilized During Treatment: Gait  belt  OT Visit Diagnosis: Unsteadiness on feet (R26.81);Other abnormalities of gait and mobility (R26.89);Muscle weakness (generalized) (M62.81);Feeding difficulties (R63.3);Other symptoms and signs involving the nervous system (R29.898);Other symptoms and signs involving cognitive function;Cognitive communication deficit (R41.841)   Activity Tolerance Patient tolerated treatment well   Patient Left in bed;with call bell/phone within reach;with bed alarm set;with family/visitor present   Nurse Communication Mobility status        Time: [id number] OT Time Calculation (min): 20 min  Charges: OT General Charges $OT Visit: 1 Procedure OT Treatments $Self Care/Home Management : 8-22 mins  Sherryl MangesLaura Halden Ball OTR/L [phone]  Evern BioLaura J Asja Ball 06/22/2017, 2:32 PM

## 2017-06-23 DIAGNOSIS — Z982 Presence of cerebrospinal fluid drainage device: Secondary | ICD-10-CM

## 2017-06-23 DIAGNOSIS — B181 Chronic viral hepatitis B without delta-agent: Secondary | ICD-10-CM

## 2017-06-23 DIAGNOSIS — F09 Unspecified mental disorder due to known physiological condition: Secondary | ICD-10-CM

## 2017-06-23 DIAGNOSIS — G932 Benign intracranial hypertension: Secondary | ICD-10-CM | POA: Diagnosis present

## 2017-06-23 DIAGNOSIS — G40901 Epilepsy, unspecified, not intractable, with status epilepticus: Secondary | ICD-10-CM

## 2017-06-23 LAB — RENAL FUNCTION PANEL
ANION GAP: 8 (ref 5–15)
Albumin: 3.2 g/dL — ABNORMAL LOW (ref 3.5–5.0)
BUN: 17 mg/dL (ref 6–20)
CO2: 25 mmol/L (ref 22–32)
CREATININE: 1.12 mg/dL (ref 0.61–1.24)
Calcium: 9.4 mg/dL (ref 8.9–10.3)
Chloride: 100 mmol/L — ABNORMAL LOW (ref 101–111)
Glucose, Bld: 87 mg/dL (ref 65–99)
POTASSIUM: 4.2 mmol/L (ref 3.5–5.1)
Phosphorus: 5.7 mg/dL — ABNORMAL HIGH (ref 2.5–4.6)
Sodium: 133 mmol/L — ABNORMAL LOW (ref 135–145)

## 2017-06-23 LAB — GLUCOSE, CAPILLARY
GLUCOSE-CAPILLARY: 175 mg/dL — AB (ref 65–99)
GLUCOSE-CAPILLARY: 88 mg/dL (ref 65–99)
Glucose-Capillary: 87 mg/dL (ref 65–99)
Glucose-Capillary: 83 mg/dL (ref 65–99)

## 2017-06-23 LAB — MAGNESIUM: MAGNESIUM: 2 mg/dL (ref 1.7–2.4)

## 2017-06-23 NOTE — Progress Notes (Signed)
PROGRESS NOTE    Ola SpurrLinston R Lupercio   UJW:[id number]  DOB: 09/27/1991  DOA: 05/20/2017 PCP: Randall HissVan Dam, [name], MD   Brief Narrative:   26 y/o with AIDS, Syphilis, Chronic Hep B, substance abuse, cigarette smoker, Bipolar disorder and poor compliance with treatment presented to ER on 5/27 intially unresponsive and then noted to be having seizures int he ER. He had been seen in the ER 3 day prior for a headache, underwent a CT of the head which was unrevealing and was discharged from the ER. Apparently he was also having fevers at home. 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 This has been treated surgically with a VP shunt (6/19 by Dr [name]) and medically treated with Flucytosine which was switched to Amphotericin due to leukopenia. Amphotericin transitioned to Fluconazole on 6/26.  Seizures being managed with Keppra.  Further complications included Strep pneumo bacteremia, metabolic encephalopathy, dysphagia requiring nutrition via NG tube CD4 count is 10 and he is maintained on Bactrim prophylaxis.    Subjective: Restless this AM. No complaints.   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 [name] 6/19 - S/p ventriculostomy 5/28-6/8.  - Keppra per neurology for seizure   - Bactrim changed to 3 times weekly 6/23 - EVD removed 6/8  - LP repeated 6/12 with opening pressure 27mmHg - LP repeated 6/15 with opening pressure 19mmHg  - Continue treatment per ID- now on Fluconazole  U retention voiding trial 6/25 but had to be recatheterized 6/26 - failed voiding trail again - foley replaced  Dysphagia secondary to acute metabolic encephalopathy   - underewent speech therapy cognitive eval  - NG tube removed - oral intake is steadily improving- cognitive  status is likely limiting hime  Acute encephalopathy:   -  meningoencephalitis, seizures, ischemic infarcts (no antiplatelet per neuro), metabolic derangements and medications    -Numerous scattered areas of ischemia on MRI   Streptococcus pneumoniae bacteremia - has completed Linezolid per ID x14 days (6/6 - last dose on 6/21)  - TTE ordered: No vegetations noted   Bordatella pneumonia - Was treated with doxycycline per ID   AIDS - CD4 count April 2018 was 2.  - Hold ART per ID - Continue bactrim daily--->3 x /wk and weekly azithromycin ppx -Starting ART 628/2018? Per ID  Neutropenia, is afebrile - Suspected due to flucytosine.  - steadily improving  Diarrhea:  - Acute, likely from tube feeds and has resolved - Flexiseal d/c 6/23  Chronic hepatitis B - Follow up with ID , LFTs minimally elevated AST ALT 90s, alkaline phosphatase 169 6/26  Malnutrition - cont to follow nutrition  - Prealbumin 6/26 is reasonable at 24  DVT prophylaxis: SCD Code Status: Full code Family Communication: mother, grandmother Disposition Plan: SNF Consultants:    PCCM   ID  NS  Neurology Procedures:   V-P shunt  Intubation  LP  Video EEG Antimicrobials:  Anti-infectives    Start     Dose/Rate Route Frequency Ordered Stop   06/19/17 1200  fluconazole (DIFLUCAN) tablet 400 mg     400 mg Oral Daily 06/19/17 1045     06/18/17 0900  sulfamethoxazole-trimethoprim (BACTRIM,SEPTRA) 200-40 MG/5ML suspension 10 mL     10 mL Per Tube Once per day on Mon Wed Fri 06/16/17 1541     06/13/17 1500  linezolid (ZYVOX) tablet 600  mg  Status:  Discontinued     600 mg Oral Every 12 hours 06/13/17 1427 06/15/17 1333   06/12/17 1354  bacitracin 50,000 Units in sodium chloride irrigation 0.9 % 500 mL irrigation  Status:  Discontinued       As needed 06/12/17 1355 06/12/17 1430   05/31/17 1000  azithromycin (ZITHROMAX) 200 MG/5ML suspension 1,200 mg     1,200 mg Per Tube Weekly 05/24/17  1315     05/30/17 1130  linezolid (ZYVOX) IVPB 600 mg  Status:  Discontinued     600 mg 300 mL/hr over 60 Minutes Intravenous Every 12 hours 05/30/17 1127 06/12/17 1656   05/26/17 1215  doxycycline (VIBRAMYCIN) 100 mg in dextrose 5 % 250 mL IVPB  Status:  Discontinued     100 mg 125 mL/hr over 120 Minutes Intravenous Every 12 hours 05/26/17 1212 06/01/17 1448   05/26/17 0554  cefTRIAXone (ROCEPHIN) 2 g in dextrose 5 % 50 mL IVPB  Status:  Discontinued     2 g 100 mL/hr over 30 Minutes Intravenous Every 24 hours 05/25/17 1358 05/31/17 1323   05/24/17 1230  azithromycin (ZITHROMAX) 1,200 mg in dextrose 5 % 250 mL IVPB     1,200 mg 250 mL/hr over 60 Minutes Intravenous Weekly 05/24/17 1128 05/24/17 1407   05/21/17 0800  emtricitabine-rilpivir-tenofovir AF (ODEFSEY) 200-25-25 MG per tablet 1 tablet  Status:  Discontinued     1 tablet Oral Daily with breakfast 05/20/17 1813 05/20/17 1909   05/21/17 0600  vancomycin (VANCOCIN) IVPB 1000 mg/200 mL premix  Status:  Discontinued     1,000 mg 200 mL/hr over 60 Minutes Intravenous Every 8 hours 05/21/17 0549 05/21/17 1400   05/21/17 0545  cefTRIAXone (ROCEPHIN) 2 g in dextrose 5 % 50 mL IVPB  Status:  Discontinued     2 g 100 mL/hr over 30 Minutes Intravenous Every 12 hours 05/21/17 0531 05/25/17 1358   05/21/17 0100  cefTRIAXone (ROCEPHIN) 2 g in dextrose 5 % 50 mL IVPB  Status:  Discontinued     2 g 100 mL/hr over 30 Minutes Intravenous Every 12 hours 05/20/17 1245 05/20/17 1919   05/20/17 2100  vancomycin (VANCOCIN) IVPB 1000 mg/200 mL premix  Status:  Discontinued     1,000 mg 200 mL/hr over 60 Minutes Intravenous Every 8 hours 05/20/17 1245 05/20/17 1918   05/20/17 2000  ampicillin (OMNIPEN) 1 g in sodium chloride 0.9 % 50 mL IVPB  Status:  Discontinued     1 g 150 mL/hr over 20 Minutes Intravenous Every 4 hours 05/20/17 1833 05/20/17 1836   05/20/17 2000  ampicillin (OMNIPEN) 2 g in sodium chloride 0.9 % 50 mL IVPB  Status:  Discontinued       2 g 150 mL/hr over 20 Minutes Intravenous Every 4 hours 05/20/17 1836 05/20/17 1919   05/20/17 2000  sulfamethoxazole-trimethoprim (BACTRIM,SEPTRA) 200-40 MG/5ML suspension 10 mL  Status:  Discontinued     10 mL Per Tube Daily 05/20/17 1901 06/16/17 1541   05/20/17 2000  emtricitabine-tenofovir AF (DESCOVY) 200-25 MG per tablet 1 tablet  Status:  Discontinued     1 tablet Oral Daily 05/20/17 1909 05/20/17 1919   05/20/17 2000  rilpivirine (EDURANT) tablet 25 mg  Status:  Discontinued     25 mg Per Tube Daily with breakfast 05/20/17 1909 05/20/17 1919   05/20/17 2000  flucytosine (ANCOBON) capsule 1,500 mg  Status:  Discontinued     1,500 mg Per Tube Every 6 hours 05/20/17 1926 06/16/17  1541   05/20/17 1815  dolutegravir (TIVICAY) tablet 50 mg  Status:  Discontinued     50 mg Oral Daily 05/20/17 1813 05/20/17 1919   05/20/17 1815  sulfamethoxazole-trimethoprim (BACTRIM,SEPTRA) 400-80 MG per tablet 1 tablet  Status:  Discontinued     1 tablet Oral Daily 05/20/17 1813 05/20/17 1901   05/20/17 1800  flucytosine (ANCOBON) capsule 1,500 mg  Status:  Discontinued     25 mg/kg  63.5 kg Per Tube Every 6 hours 05/20/17 1645 05/20/17 1926   05/20/17 1700  amphotericin B liposome (AMBISOME) 250 mg in dextrose 5 % 500 mL IVPB  Status:  Discontinued     4 mg/kg  63.5 kg 250 mL/hr over 120 Minutes Intravenous Every 24 hours 05/20/17 1645 06/19/17 1045   05/20/17 1300  ampicillin (OMNIPEN) 2 g in sodium chloride 0.9 % 50 mL IVPB  Status:  Discontinued     2 g 150 mL/hr over 20 Minutes Intravenous  Once 05/20/17 1237 05/20/17 1242   05/20/17 1300  acyclovir (ZOVIRAX) 635 mg in dextrose 5 % 100 mL IVPB  Status:  Discontinued     10 mg/kg  63.5 kg 112.7 mL/hr over 60 Minutes Intravenous Every 8 hours 05/20/17 1243 05/20/17 1918   05/20/17 1245  cefTRIAXone (ROCEPHIN) 2 g in dextrose 5 % 50 mL IVPB     2 g 100 mL/hr over 30 Minutes Intravenous  Once 05/20/17 1237 05/20/17 1327   05/20/17 1245   vancomycin (VANCOCIN) IVPB 1000 mg/200 mL premix     1,000 mg 200 mL/hr over 60 Minutes Intravenous  Once 05/20/17 1237 05/20/17 1420       Objective: Vitals:   06/22/17 1824 06/23/17 0116 06/23/17 0629 06/23/17 1504  BP: 106/72 116/69 108/77 (!) 97/56  Pulse: 90 (!) 101 (!) 109 (!) 102  Resp: 20 20 20 20   Temp: 97.9 F (36.6 C) 98.1 F (36.7 C) 98.1 F (36.7 C) 97.9 F (36.6 C)  TempSrc: Oral Oral Oral Oral  SpO2: 100% 98% 95% 98%  Weight:      Height:        Intake/Output Summary (Last 24 hours) at 06/23/17 1704 Last data filed at 06/23/17 4540  Gross per 24 hour  Intake              640 ml  Output             2100 ml  Net            -1460 ml   Filed Weights   06/19/17 0254 06/20/17 0521 06/22/17 0600  Weight: 64.3 kg (141 lb 11.2 oz) 65 kg (143 lb 3.2 oz) 63 kg (139 lb)    Examination: General exam: Appears comfortable - alert and restless today HEENT: PERRLA, oral mucosa moist, no sclera icterus or thrush Respiratory system: Clear to auscultation. Respiratory effort normal. Cardiovascular system: S1 & S2 heard, RRR.  No murmurs  Gastrointestinal system: Abdomen soft, non-tender, nondistended. Normal bowel sound. No organomegaly Central nervous system: Alert - No focal neurological deficits. Extremities: No cyanosis, clubbing or edema Skin: No rashes or ulcers Psychiatry:  Minimally verbal    Data Reviewed: I have personally reviewed following labs and imaging studies  CBC:  Recent Labs Lab 06/17/17 0530 06/18/17 0304 06/19/17 0447 06/22/17 0202  WBC 3.1* 2.1* 3.1* 2.7*  NEUTROABS 1.5* 1.1* 1.8  --   HGB 10.1* 9.7* 10.1* 10.5*  HCT 30.0* 29.4* 30.2* 30.9*  MCV 88.0 87.0 86.0 86.6  PLT 280  285 305 280   Basic Metabolic Panel:  Recent Labs Lab 06/19/17 0447 06/20/17 0422 06/20/17 0431 06/21/17 0500 06/21/17 1840 06/22/17 0202 06/23/17 0416  NA 136  --  134* 134* 134* 132* 133*  K 4.2  --  4.3 3.8 3.8 3.9 4.2  CL 109  --  104 102 102 102  100*  CO2 22  --  23 20* 22 23 25   GLUCOSE 93  --  90 103* 109* 96 87  BUN 7  --  10 14 14 18 17   CREATININE 0.90  --  0.96 0.91 0.98 1.01 1.12  CALCIUM 9.0  --  9.0 9.3 9.3 9.0 9.4  MG 1.9 2.2  --  1.8  --  1.5* 2.0  PHOS 5.5*  --  5.9* 5.2*  --  5.5* 5.7*   GFR: Estimated Creatinine Clearance: 89.2 mL/min (by C-G formula based on SCr of 1.12 mg/dL). Liver Function Tests:  Recent Labs Lab 06/19/17 0447 06/20/17 0431 06/21/17 0500 06/21/17 1840 06/22/17 0202 06/23/17 0416  AST 96*  --   --  117*  --   --   ALT 92*  --   --  122*  --   --   ALKPHOS 169*  --   --  180*  --   --   BILITOT 0.5  --   --  0.4  --   --   PROT 7.8  --   --  9.4*  --   --   ALBUMIN 2.9* 2.9* 3.1* 3.2* 3.1* 3.2*   No results for input(s): LIPASE, AMYLASE in the last 168 hours. No results for input(s): AMMONIA in the last 168 hours. Coagulation Profile: No results for input(s): INR, PROTIME in the last 168 hours. Cardiac Enzymes: No results for input(s): CKTOTAL, CKMB, CKMBINDEX, TROPONINI in the last 168 hours. BNP (last 3 results) No results for input(s): PROBNP in the last 8760 hours. HbA1C: No results for input(s): HGBA1C in the last 72 hours. CBG:  Recent Labs Lab 06/22/17 1643 06/22/17 2257 06/23/17 0627 06/23/17 1128 06/23/17 1610  GLUCAP 83 111* 87 175* 83   Lipid Profile: No results for input(s): CHOL, HDL, LDLCALC, TRIG, CHOLHDL, LDLDIRECT in the last 72 hours. Thyroid Function Tests: No results for input(s): TSH, T4TOTAL, FREET4, T3FREE, THYROIDAB in the last 72 hours. Anemia Panel: No results for input(s): VITAMINB12, FOLATE, FERRITIN, TIBC, IRON, RETICCTPCT in the last 72 hours. Urine analysis:    Component Value Date/Time   COLORURINE YELLOW 05/20/2017 1742   APPEARANCEUR CLEAR 05/20/2017 1742   LABSPEC 1.020 05/20/2017 1742   PHURINE 7.0 05/20/2017 1742   GLUCOSEU NEGATIVE 05/20/2017 1742   HGBUR NEGATIVE 05/20/2017 1742   BILIRUBINUR NEGATIVE 05/20/2017 1742    KETONESUR NEGATIVE 05/20/2017 1742   PROTEINUR NEGATIVE 05/20/2017 1742   UROBILINOGEN 4.0 (H) 09/09/2014 1055   NITRITE NEGATIVE 05/20/2017 1742   LEUKOCYTESUR NEGATIVE 05/20/2017 1742   Sepsis Labs: @LABRCNTIP (procalcitonin:4,lacticidven:4) )No results found for this or any previous visit (from the past 240 hour(s)).       Radiology Studies: No results found.    Scheduled Meds: . azithromycin  1,200 mg Per Tube Weekly  . chlorhexidine  15 mL Mouth Rinse BID  . Chlorhexidine Gluconate Cloth  6 each Topical Daily  . feeding supplement (ENSURE ENLIVE)  237 mL Oral TID BM  . fluconazole  400 mg Oral Daily  . free water  200 mL Oral Q8H  . insulin aspart  2-6 Units Subcutaneous Q8H  .  levETIRAcetam  1,000 mg Oral BID  . mouth rinse  15 mL Mouth Rinse q12n4p  . QUEtiapine  25 mg Oral BID  . sulfamethoxazole-trimethoprim  10 mL Per Tube Once per day on Mon Wed Fri  . tamsulosin  0.4 mg Oral Daily   Continuous Infusions: . sodium chloride Stopped (06/05/17 0200)     LOS: 34 days    Time spent in minutes: 35    Calvert Cantor, MD [facility] Pager: [email or web address] Password [email or web address] 06/23/2017, 5:04 PM

## 2017-06-23 NOTE — Progress Notes (Deleted)
Patient heart rate was 39, Dr on call notified no new order at this time will continue monitor. 

## 2017-06-23 NOTE — Progress Notes (Signed)
Subjective:  Pt no new complaints   Antibiotics:  Anti-infectives    Start     Dose/Rate Route Frequency Ordered Stop   06/19/17 1200  fluconazole (DIFLUCAN) tablet 400 mg     400 mg Oral Daily 06/19/17 1045     06/18/17 0900  sulfamethoxazole-trimethoprim (BACTRIM,SEPTRA) 200-40 MG/5ML suspension 10 mL     10 mL Per Tube Once per day on Mon Wed Fri 06/16/17 1541     06/13/17 1500  linezolid (ZYVOX) tablet 600 mg  Status:  Discontinued     600 mg Oral Every 12 hours 06/13/17 1427 06/15/17 1333   06/12/17 1354  bacitracin 50,000 Units in sodium chloride irrigation 0.9 % 500 mL irrigation  Status:  Discontinued       As needed 06/12/17 1355 06/12/17 1430   05/31/17 1000  azithromycin (ZITHROMAX) 200 MG/5ML suspension 1,200 mg     1,200 mg Per Tube Weekly 05/24/17 1315     05/30/17 1130  linezolid (ZYVOX) IVPB 600 mg  Status:  Discontinued     600 mg 300 mL/hr over 60 Minutes Intravenous Every 12 hours 05/30/17 1127 06/12/17 1656   05/26/17 1215  doxycycline (VIBRAMYCIN) 100 mg in dextrose 5 % 250 mL IVPB  Status:  Discontinued     100 mg 125 mL/hr over 120 Minutes Intravenous Every 12 hours 05/26/17 1212 06/01/17 1448   05/26/17 0554  cefTRIAXone (ROCEPHIN) 2 g in dextrose 5 % 50 mL IVPB  Status:  Discontinued     2 g 100 mL/hr over 30 Minutes Intravenous Every 24 hours 05/25/17 1358 05/31/17 1323   05/24/17 1230  azithromycin (ZITHROMAX) 1,200 mg in dextrose 5 % 250 mL IVPB     1,200 mg 250 mL/hr over 60 Minutes Intravenous Weekly 05/24/17 1128 05/24/17 1407   05/21/17 0800  emtricitabine-rilpivir-tenofovir AF (ODEFSEY) 200-25-25 MG per tablet 1 tablet  Status:  Discontinued     1 tablet Oral Daily with breakfast 05/20/17 1813 05/20/17 1909   05/21/17 0600  vancomycin (VANCOCIN) IVPB 1000 mg/200 mL premix  Status:  Discontinued     1,000 mg 200 mL/hr over 60 Minutes Intravenous Every 8 hours 05/21/17 0549 05/21/17 1400   05/21/17 0545  cefTRIAXone (ROCEPHIN) 2 g in  dextrose 5 % 50 mL IVPB  Status:  Discontinued     2 g 100 mL/hr over 30 Minutes Intravenous Every 12 hours 05/21/17 0531 05/25/17 1358   05/21/17 0100  cefTRIAXone (ROCEPHIN) 2 g in dextrose 5 % 50 mL IVPB  Status:  Discontinued     2 g 100 mL/hr over 30 Minutes Intravenous Every 12 hours 05/20/17 1245 05/20/17 1919   05/20/17 2100  vancomycin (VANCOCIN) IVPB 1000 mg/200 mL premix  Status:  Discontinued     1,000 mg 200 mL/hr over 60 Minutes Intravenous Every 8 hours 05/20/17 1245 05/20/17 1918   05/20/17 2000  ampicillin (OMNIPEN) 1 g in sodium chloride 0.9 % 50 mL IVPB  Status:  Discontinued     1 g 150 mL/hr over 20 Minutes Intravenous Every 4 hours 05/20/17 1833 05/20/17 1836   05/20/17 2000  ampicillin (OMNIPEN) 2 g in sodium chloride 0.9 % 50 mL IVPB  Status:  Discontinued     2 g 150 mL/hr over 20 Minutes Intravenous Every 4 hours 05/20/17 1836 05/20/17 1919   05/20/17 2000  sulfamethoxazole-trimethoprim (BACTRIM,SEPTRA) 200-40 MG/5ML suspension 10 mL  Status:  Discontinued     10 mL Per Tube Daily 05/20/17  1901 06/16/17 1541   05/20/17 2000  emtricitabine-tenofovir AF (DESCOVY) 200-25 MG per tablet 1 tablet  Status:  Discontinued     1 tablet Oral Daily 05/20/17 1909 05/20/17 1919   05/20/17 2000  rilpivirine (EDURANT) tablet 25 mg  Status:  Discontinued     25 mg Per Tube Daily with breakfast 05/20/17 1909 05/20/17 1919   05/20/17 2000  flucytosine (ANCOBON) capsule 1,500 mg  Status:  Discontinued     1,500 mg Per Tube Every 6 hours 05/20/17 1926 06/16/17 1541   05/20/17 1815  dolutegravir (TIVICAY) tablet 50 mg  Status:  Discontinued     50 mg Oral Daily 05/20/17 1813 05/20/17 1919   05/20/17 1815  sulfamethoxazole-trimethoprim (BACTRIM,SEPTRA) 400-80 MG per tablet 1 tablet  Status:  Discontinued     1 tablet Oral Daily 05/20/17 1813 05/20/17 1901   05/20/17 1800  flucytosine (ANCOBON) capsule 1,500 mg  Status:  Discontinued     25 mg/kg  63.5 kg Per Tube Every 6 hours  05/20/17 1645 05/20/17 1926   05/20/17 1700  amphotericin B liposome (AMBISOME) 250 mg in dextrose 5 % 500 mL IVPB  Status:  Discontinued     4 mg/kg  63.5 kg 250 mL/hr over 120 Minutes Intravenous Every 24 hours 05/20/17 1645 06/19/17 1045   05/20/17 1300  ampicillin (OMNIPEN) 2 g in sodium chloride 0.9 % 50 mL IVPB  Status:  Discontinued     2 g 150 mL/hr over 20 Minutes Intravenous  Once 05/20/17 1237 05/20/17 1242   05/20/17 1300  acyclovir (ZOVIRAX) 635 mg in dextrose 5 % 100 mL IVPB  Status:  Discontinued     10 mg/kg  63.5 kg 112.7 mL/hr over 60 Minutes Intravenous Every 8 hours 05/20/17 1243 05/20/17 1918   05/20/17 1245  cefTRIAXone (ROCEPHIN) 2 g in dextrose 5 % 50 mL IVPB     2 g 100 mL/hr over 30 Minutes Intravenous  Once 05/20/17 1237 05/20/17 1327   05/20/17 1245  vancomycin (VANCOCIN) IVPB 1000 mg/200 mL premix     1,000 mg 200 mL/hr over 60 Minutes Intravenous  Once 05/20/17 1237 05/20/17 1420      Medications: Scheduled Meds: . azithromycin  1,200 mg Per Tube Weekly  . chlorhexidine  15 mL Mouth Rinse BID  . Chlorhexidine Gluconate Cloth  6 each Topical Daily  . feeding supplement (ENSURE ENLIVE)  237 mL Oral TID BM  . fluconazole  400 mg Oral Daily  . free water  200 mL Oral Q8H  . insulin aspart  2-6 Units Subcutaneous Q8H  . levETIRAcetam  1,000 mg Oral BID  . mouth rinse  15 mL Mouth Rinse q12n4p  . QUEtiapine  25 mg Oral BID  . sulfamethoxazole-trimethoprim  10 mL Per Tube Once per day on Mon Wed Fri  . tamsulosin  0.4 mg Oral Daily   Continuous Infusions: . sodium chloride Stopped (06/05/17 0200)   PRN Meds:.sodium chloride, acetaminophen, sodium chloride flush    Objective: Weight change:   Intake/Output Summary (Last 24 hours) at 06/23/17 1614 Last data filed at 06/23/17 0629  Gross per 24 hour  Intake              640 ml  Output             2100 ml  Net            -1460 ml   Blood pressure (!) 97/56, pulse (!) 102, temperature 97.9 F  (36.6 C), temperature  source Oral, resp. rate 20, height 6\' 1"  (1.854 m), weight 139 lb (63 kg), SpO2 98 %. Temp:  [97.9 F (36.6 C)-98.1 F (36.7 C)] 97.9 F (36.6 C) (06/30 1504) Pulse Rate:  [90-109] 102 (06/30 1504) Resp:  [20] 20 (06/30 1504) BP: (97-116)/(56-77) 97/56 (06/30 1504) SpO2:  [95 %-100 %] 98 % (06/30 1504)  Physical Exam: General: Alert and awake, person, says he is in" Infectious Disease" HEENT:  EOMI, surgical scar clean CVS bradycardic rate, normal r,  no murmur rubs or gallops Chest: clear to auscultation bilaterally, no wheezing, rales or rhonchi Abdomen: soft  nondistended, normal bowel sounds, Extremities: no  clubbing or edema noted bilaterally Skin: chronic hyperpigmented lesions on legs (could be KS) Neuro: nonfocal  CBC: CBC Latest Ref Rng & Units 06/22/2017 06/19/2017 06/18/2017  WBC 4.0 - 10.5 K/uL 2.7(L) 3.1(L) 2.1(L)  Hemoglobin 13.0 - 17.0 g/dL 10.5(L) 10.1(L) 9.7(L)  Hematocrit 39.0 - 52.0 % 30.9(L) 30.2(L) 29.4(L)  Platelets 150 - 400 K/uL 280 305 285      BMET  Recent Labs  06/22/17 0202 06/23/17 0416  NA 132* 133*  K 3.9 4.2  CL 102 100*  CO2 23 25  GLUCOSE 96 87  BUN 18 17  CREATININE 1.01 1.12  CALCIUM 9.0 9.4     Liver Panel   Recent Labs  06/21/17 1840 06/22/17 0202 06/23/17 0416  PROT 9.4*  --   --   ALBUMIN 3.2* 3.1* 3.2*  AST 117*  --   --   ALT 122*  --   --   ALKPHOS 180*  --   --   BILITOT 0.4  --   --        Sedimentation Rate No results for input(s): ESRSEDRATE in the last 72 hours. C-Reactive Protein No results for input(s): CRP in the last 72 hours.  Micro Results: Recent Results (from the past 720 hour(s))  CSF culture with Stat gram stain     Status: None   Collection Time: 05/29/17 12:19 PM  Result Value Ref Range Status   Specimen Description CSF  Final   Special Requests NONE  Final   Gram Stain   Final    NO WBC SEEN YEAST CORRECTED RESULTS PREVIOUSLY REPORTED AS: NO ORGANISMS  SEEN CORRECTED RESULTS CALLED TO: M HARPER,RN AT 1525 05/29/17 BY L BENFIELD    Culture   Final    FEW STAPHYLOCOCCUS EPIDERMIDIS CRITICAL RESULT CALLED TO, READ BACK BY AND VERIFIED WITH: DR Drue Second 05/30/17 @ 1119 M VESTAL    Report Status 06/05/2017 FINAL  Final   Organism ID, Bacteria STAPHYLOCOCCUS EPIDERMIDIS  Final      Susceptibility   Staphylococcus epidermidis - MIC*    CIPROFLOXACIN <=0.5 SENSITIVE Sensitive     ERYTHROMYCIN >=8 RESISTANT Resistant     GENTAMICIN <=0.5 SENSITIVE Sensitive     OXACILLIN >=4 RESISTANT Resistant     TETRACYCLINE 2 SENSITIVE Sensitive     VANCOMYCIN 2 SENSITIVE Sensitive     TRIMETH/SULFA >=320 RESISTANT Resistant     CLINDAMYCIN >=8 RESISTANT Resistant     RIFAMPIN <=0.5 SENSITIVE Sensitive     Inducible Clindamycin NEGATIVE Sensitive     * FEW STAPHYLOCOCCUS EPIDERMIDIS  CSF culture     Status: None   Collection Time: 06/05/17  9:39 AM  Result Value Ref Range Status   Specimen Description CSF  Final   Special Requests Immunocompromised  Final   Gram Stain   Final    WBC PRESENT, PREDOMINANTLY MONONUCLEAR  BUDDING YEAST SEEN CRITICAL RESULT CALLED TO, READ BACK BY AND VERIFIED WITH: Buckman RN 10:20 06/05/17 ([email or web address])    Culture RARE CRYPTOCOCCUS NEOFORMANS  Final   Report Status 06/12/2017 FINAL  Final  Fungus Culture With Stain     Status: None (Preliminary result)   Collection Time: 06/05/17  9:40 AM  Result Value Ref Range Status   Fungus Stain Final report  Final    Comment: (NOTE) Performed At: [facility] [employer] [address] [place], [place] [id number] Mila HomerHancock William F MD [phone]    Fungus (Mycology) Culture PENDING  Incomplete   Fungal Source CSF  Final  Fungus Culture Result     Status: None   Collection Time: 06/05/17  9:40 AM  Result Value Ref Range Status   Result 1 Yeast observed  Final    Comment: (NOTE) POSITIVE SMEAR REPORTED TO AMANDA L. AT 1101 ON 06/07/17 BY AM. Performed At: [facility] [address] [place], [place] [id number] Mila HomerHancock William F MD [phone]   CSF culture with Stat gram stain     Status: None   Collection Time: 06/08/17  1:28 PM  Result Value Ref Range Status   Specimen Description CSF  Final   Special Requests Immunocompromised TUBE 2  Final   Gram Stain   Final    WBC PRESENT, PREDOMINANTLY MONONUCLEAR CYTOSPIN SMEAR CORRECTED RESULTS BUDDING YEAST SEEN PREVIOUSLY REPORTED AS: NO ORGANISMS SEEN CORRECTED RESULTS CALLED TO: RN Patsey Berthold ECKELMANN [phone] MLM    Culture NO GROWTH 7 DAYS  Final   Report Status 06/15/2017 FINAL  Final  Culture, fungus without smear     Status: None (Preliminary result)   Collection Time: 06/08/17  1:28 PM  Result Value Ref Range Status   Specimen Description CSF  Final   Special Requests Immunocompromised TUBE 2  Final   Culture NO FUNGUS ISOLATED AFTER 14 DAYS  Final   Report Status PENDING  Incomplete    Studies/Results: No results found.    Assessment/Plan:  INTERVAL HISTORY: pt neering 5 weeks of antifungal therapy   Principal Problem:   Seizure (HCC) Active Problems:   HIV (human immunodeficiency virus infection) (HCC)   Rash and nonspecific skin eruption   Insomnia   Bipolar disorder (HCC)   Intersphincteric abscess   AIDS (HCC)   Headache   Meningoencephalitis   Respiratory failure (HCC)   Encephalopathy acute   Patient has nasogastric tube   History of syphilis   Tobacco abuse   Tachypnea    Justin Ball is a 26 y.o. 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

## 2017-06-23 NOTE — Progress Notes (Addendum)
  Speech Language Pathology Treatment: Dysphagia  Patient Details Name: Ola SpurrLinston R Stockburger MRN: [id number] DOB: 06/17/1991 Today's Date: 06/23/2017 Time: [id number] SLP Time Calculation (min) (ACUTE ONLY): 16 min  Assessment / Plan / Recommendation Clinical Impression  Dysphagia treatment provided to check diet tolerance. Pt showed no overt s/s of aspiration with regular food/ thin liquid trials. Grandmother at bedside stated that pt has had a good appetite and has been able to feed himself. She feels that pt's cognitive status has changed from yesterday- reported he seems more confused and has been "seeing things". Pt was confused and somewhat agitated during tx session, using call bell as if it were a telephone and stating he is ready to go home. Oriented to self and location, disoriented to time and situation. Recommend continuing regular diet/ thin liquids, SLP will sign off for swallow orders as pt has ben tolerating diet well. Will continue to follow for cognition. Pt will need continued speech therapy at next level of care for cognitive skills (orientation, problem solving, memory, safety awareness).   HPI HPI: Pt is a 26 year old man admitted s/p seizure activity having several days of recent headaches. 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 EVD was placed after acute decompensation on 5/28 with decorticate posturing and asymmetric pupils. 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 PMH includes: syphilis, substance abuse, soft palate ulceration, scabies, neuromuscular disorder, HIV/AIDS, depression, bipolar disorder, anxiety      SLP Plan  Goals updated       Recommendations  Diet recommendations: Regular;Thin liquid Liquids provided via: Cup;Straw Medication Administration: Whole meds with liquid Supervision: Patient able to self feed;Full supervision/cueing for compensatory strategies Compensations: Minimize  environmental distractions;Slow rate;Small sips/bites Postural Changes and/or Swallow Maneuvers: Seated upright 90 degrees                Oral Care Recommendations: Oral care BID Follow up Recommendations: Inpatient Rehab SLP Visit Diagnosis: Dysphagia, unspecified (R13.10) Plan: Goals updated       GO                Metro KungAmy K Oleksiak, MA, CCC-SLP 06/23/2017, 4:04 PM [phone]

## 2017-06-24 DIAGNOSIS — G9600 Cerebrospinal fluid leak, unspecified: Secondary | ICD-10-CM

## 2017-06-24 LAB — RENAL FUNCTION PANEL
Albumin: 3.3 g/dL — ABNORMAL LOW (ref 3.5–5.0)
Anion gap: 7 (ref 5–15)
BUN: 21 mg/dL — AB (ref 6–20)
CHLORIDE: 100 mmol/L — AB (ref 101–111)
CO2: 26 mmol/L (ref 22–32)
Calcium: 9.6 mg/dL (ref 8.9–10.3)
Creatinine, Ser: 1.27 mg/dL — ABNORMAL HIGH (ref 0.61–1.24)
GFR calc Af Amer: >60 mL/min (ref >60)
GFR calc non Af Amer: >60 mL/min (ref >60)
GLUCOSE: 88 mg/dL (ref 65–99)
POTASSIUM: 4 mmol/L (ref 3.5–5.1)
Phosphorus: 5.6 mg/dL — ABNORMAL HIGH (ref 2.5–4.6)
Sodium: 133 mmol/L — ABNORMAL LOW (ref 135–145)

## 2017-06-24 LAB — GLUCOSE, CAPILLARY
GLUCOSE-CAPILLARY: 98 mg/dL (ref 65–99)
GLUCOSE-CAPILLARY: 73 mg/dL (ref 65–99)
Glucose-Capillary: 91 mg/dL (ref 65–99)

## 2017-06-24 LAB — MAGNESIUM: Magnesium: 1.8 mg/dL (ref 1.7–2.4)

## 2017-06-24 MED ORDER — EMTRICITAB-RILPIVIR-TENOFOV AF 200-25-25 MG PO TABS: 1.0000 | ORAL_TABLET | Freq: Every day | ORAL | Status: DC

## 2017-06-24 MED ORDER — DOLUTEGRAVIR SODIUM 50 MG PO TABS
50.0000 mg | ORAL_TABLET | Freq: Every day | ORAL | Status: DC
  Administered 2017-06-25 – 2017-06-27 (×3): 50 mg via ORAL
  Filled 2017-06-24 (×3): qty 1

## 2017-06-24 MED ORDER — SODIUM CHLORIDE 0.9 % IV BOLUS (SEPSIS)
1000.0000 mL | Freq: Once | INTRAVENOUS | Status: AC
  Administered 2017-06-24: 1000 mL via INTRAVENOUS

## 2017-06-24 MED ORDER — EMTRICITAB-RILPIVIR-TENOFOV AF 200-25-25 MG PO TABS
1.0000 | ORAL_TABLET | Freq: Every day | ORAL | Status: DC
  Administered 2017-06-25 – 2017-06-27 (×3): 1 via ORAL
  Filled 2017-06-24 (×4): qty 1

## 2017-06-24 NOTE — Progress Notes (Signed)
PROGRESS NOTE    Justin Ball   FAO:[id number]  DOB: 10/14/1991  DOA: 05/20/2017 PCP: Randall HissVan Dam, [name], MD   Brief Narrative:   26 y/o with AIDS, Syphilis, Chronic Hep B, substance abuse, cigarette smoker, Bipolar disorder and poor compliance with treatment presented to ER on 5/27 intially unresponsive and then noted to be having seizures int he ER. He had been seen in the ER 3 day prior for a headache, underwent a CT of the head which was unrevealing and was discharged from the ER. Apparently he was also having fevers at home. 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 This has been treated surgically with a VP shunt (6/19 by Dr [name]) and medically treated with Flucytosine which was switched to Amphotericin due to leukopenia. Amphotericin transitioned to Fluconazole on 6/26.  Seizures being managed with Keppra.  Further complications included Strep pneumo bacteremia, metabolic encephalopathy, dysphagia requiring nutrition via NG tube CD4 count is 10 and he is maintained on Bactrim prophylaxis.    Subjective: Aware of where he is today. No shortness of breath, cough, vomiting. No other complaints.   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 [name] 6/19 - S/p ventriculostomy 5/28-6/8 to prevent uncal herniation - Keppra per neurology for seizure   - EVD removed 6/8   - Continue treatment per ID- now on Fluconazole 400 mg daily to complete 5 wks  U retention voiding trial 6/25 but had to be recatheterized 6/26 - failed voiding trail again - foley replaced- I have spoken with Urology, Dr Wilson SingerWren who suspect that he may have a bladder stretch injury and may need a chronic foley-  Dr Annabell HowellsWrenn is recommending that patient to f/u with Urology as outpt- discussed  with grandmother  Dysphagia secondary to acute metabolic encephalopathy - NG tube removed - oral intake is steadily improving- cognitive status is limiting him at times  Acute encephalopathy:   -  meningoencephalitis, seizures, ischemic infarcts (no antiplatelet per neuro), metabolic derangements and medications    -Numerous scattered areas of ischemia on MRI   Strep epidermidis in CSF - has completed Linezolid per ID x14 days (6/6 - last dose on 6/21)  - TTE ordered: No vegetations noted   Strep pneumoniae bacteremia - treated with Ceftriaxone  Bordatella pneumonia - Was treated with doxycycline    AIDS - CD4 count April 2018 was 2.  - ARV being resumed today per ID- [place], [place]- will need to avoid PPI, H2 blockers, Mg+, Ca+ supplements - Continue bactrim3 x /wk and weekly azithromycin ppx -Starting ART 628/2018? Per ID  Chronic Hep B - high viral load but LFTs normal - TAF-FTS per ID  Neutropenia, is afebrile - Suspected due to flucytosine.  - steadily improving  Diarrhea:  - Acute, likely from tube feeds and has resolved - Flexiseal d/c 6/23  Chronic hepatitis B - Follow up with ID , LFTs minimally elevated AST ALT 90s, alkaline phosphatase 169 6/26  Malnutrition- severe - cont to follow nutrition- intake is erratic- giving nutritional supplements  - Prealbumin 6/26 is reasonable at 24  Bordatella pneumonia - Was treated with doxycycline per ID   Severe debility - unable to walk and will need significant amount of PT at SNF  DVT prophylaxis: SCD Code Status: Full code Family Communication: mother, grandmother Disposition Plan: SNF Consultants:    PCCM  ID  NS  Neurology Procedures:   V-P shunt  Intubation  LP  Video EEG Antimicrobials:  Anti-infectives    Start     Dose/Rate Route Frequency Ordered Stop   06/19/17 1200  fluconazole (DIFLUCAN) tablet 400 mg     400 mg Oral Daily 06/19/17 1045     06/18/17 0900   sulfamethoxazole-trimethoprim (BACTRIM,SEPTRA) 200-40 MG/5ML suspension 10 mL     10 mL Per Tube Once per day on Mon Wed Fri 06/16/17 1541     06/13/17 1500  linezolid (ZYVOX) tablet 600 mg  Status:  Discontinued     600 mg Oral Every 12 hours 06/13/17 1427 06/15/17 1333   06/12/17 1354  bacitracin 50,000 Units in sodium chloride irrigation 0.9 % 500 mL irrigation  Status:  Discontinued       As needed 06/12/17 1355 06/12/17 1430   05/31/17 1000  azithromycin (ZITHROMAX) 200 MG/5ML suspension 1,200 mg     1,200 mg Per Tube Weekly 05/24/17 1315     05/30/17 1130  linezolid (ZYVOX) IVPB 600 mg  Status:  Discontinued     600 mg 300 mL/hr over 60 Minutes Intravenous Every 12 hours 05/30/17 1127 06/12/17 1656   05/26/17 1215  doxycycline (VIBRAMYCIN) 100 mg in dextrose 5 % 250 mL IVPB  Status:  Discontinued     100 mg 125 mL/hr over 120 Minutes Intravenous Every 12 hours 05/26/17 1212 06/01/17 1448   05/26/17 0554  cefTRIAXone (ROCEPHIN) 2 g in dextrose 5 % 50 mL IVPB  Status:  Discontinued     2 g 100 mL/hr over 30 Minutes Intravenous Every 24 hours 05/25/17 1358 05/31/17 1323   05/24/17 1230  azithromycin (ZITHROMAX) 1,200 mg in dextrose 5 % 250 mL IVPB     1,200 mg 250 mL/hr over 60 Minutes Intravenous Weekly 05/24/17 1128 05/24/17 1407   05/21/17 0800  emtricitabine-rilpivir-tenofovir AF (ODEFSEY) 200-25-25 MG per tablet 1 tablet  Status:  Discontinued     1 tablet Oral Daily with breakfast 05/20/17 1813 05/20/17 1909   05/21/17 0600  vancomycin (VANCOCIN) IVPB 1000 mg/200 mL premix  Status:  Discontinued     1,000 mg 200 mL/hr over 60 Minutes Intravenous Every 8 hours 05/21/17 0549 05/21/17 1400   05/21/17 0545  cefTRIAXone (ROCEPHIN) 2 g in dextrose 5 % 50 mL IVPB  Status:  Discontinued     2 g 100 mL/hr over 30 Minutes Intravenous Every 12 hours 05/21/17 0531 05/25/17 1358   05/21/17 0100  cefTRIAXone (ROCEPHIN) 2 g in dextrose 5 % 50 mL IVPB  Status:  Discontinued     2 g 100 mL/hr  over 30 Minutes Intravenous Every 12 hours 05/20/17 1245 05/20/17 1919   05/20/17 2100  vancomycin (VANCOCIN) IVPB 1000 mg/200 mL premix  Status:  Discontinued     1,000 mg 200 mL/hr over 60 Minutes Intravenous Every 8 hours 05/20/17 1245 05/20/17 1918   05/20/17 2000  ampicillin (OMNIPEN) 1 g in sodium chloride 0.9 % 50 mL IVPB  Status:  Discontinued     1 g 150 mL/hr over 20 Minutes Intravenous Every 4 hours 05/20/17 1833 05/20/17 1836   05/20/17 2000  ampicillin (OMNIPEN) 2 g in sodium chloride 0.9 % 50 mL IVPB  Status:  Discontinued     2 g 150 mL/hr over 20 Minutes Intravenous Every 4 hours 05/20/17 1836 05/20/17 1919   05/20/17 2000  sulfamethoxazole-trimethoprim (BACTRIM,SEPTRA) 200-40 MG/5ML suspension 10 mL  Status:  Discontinued     10 mL  Per Tube Daily 05/20/17 1901 06/16/17 1541   05/20/17 2000  emtricitabine-tenofovir AF (DESCOVY) 200-25 MG per tablet 1 tablet  Status:  Discontinued     1 tablet Oral Daily 05/20/17 1909 05/20/17 1919   05/20/17 2000  rilpivirine (EDURANT) tablet 25 mg  Status:  Discontinued     25 mg Per Tube Daily with breakfast 05/20/17 1909 05/20/17 1919   05/20/17 2000  flucytosine (ANCOBON) capsule 1,500 mg  Status:  Discontinued     1,500 mg Per Tube Every 6 hours 05/20/17 1926 06/16/17 1541   05/20/17 1815  dolutegravir (TIVICAY) tablet 50 mg  Status:  Discontinued     50 mg Oral Daily 05/20/17 1813 05/20/17 1919   05/20/17 1815  sulfamethoxazole-trimethoprim (BACTRIM,SEPTRA) 400-80 MG per tablet 1 tablet  Status:  Discontinued     1 tablet Oral Daily 05/20/17 1813 05/20/17 1901   05/20/17 1800  flucytosine (ANCOBON) capsule 1,500 mg  Status:  Discontinued     25 mg/kg  63.5 kg Per Tube Every 6 hours 05/20/17 1645 05/20/17 1926   05/20/17 1700  amphotericin B liposome (AMBISOME) 250 mg in dextrose 5 % 500 mL IVPB  Status:  Discontinued     4 mg/kg  63.5 kg 250 mL/hr over 120 Minutes Intravenous Every 24 hours 05/20/17 1645 06/19/17 1045   05/20/17  1300  ampicillin (OMNIPEN) 2 g in sodium chloride 0.9 % 50 mL IVPB  Status:  Discontinued     2 g 150 mL/hr over 20 Minutes Intravenous  Once 05/20/17 1237 05/20/17 1242   05/20/17 1300  acyclovir (ZOVIRAX) 635 mg in dextrose 5 % 100 mL IVPB  Status:  Discontinued     10 mg/kg  63.5 kg 112.7 mL/hr over 60 Minutes Intravenous Every 8 hours 05/20/17 1243 05/20/17 1918   05/20/17 1245  cefTRIAXone (ROCEPHIN) 2 g in dextrose 5 % 50 mL IVPB     2 g 100 mL/hr over 30 Minutes Intravenous  Once 05/20/17 1237 05/20/17 1327   05/20/17 1245  vancomycin (VANCOCIN) IVPB 1000 mg/200 mL premix     1,000 mg 200 mL/hr over 60 Minutes Intravenous  Once 05/20/17 1237 05/20/17 1420       Objective: Vitals:   06/23/17 2105 06/24/17 0103 06/24/17 0607 06/24/17 1100  BP: 103/68 134/68 104/66 101/67  Pulse: (!) 101 (!) 111 (!) 106 (!) 112  Resp: 20 20 20 18   Temp: 97.8 F (36.6 C) 98 F (36.7 C) 97.8 F (36.6 C) 98.5 F (36.9 C)  TempSrc: Oral Oral Oral Oral  SpO2: 100% 100% 100% 98%  Weight:      Height:        Intake/Output Summary (Last 24 hours) at 06/24/17 1416 Last data filed at 06/24/17 1610  Gross per 24 hour  Intake              720 ml  Output             1700 ml  Net             -980 ml   Filed Weights   06/19/17 0254 06/20/17 0521 06/22/17 0600  Weight: 64.3 kg (141 lb 11.2 oz) 65 kg (143 lb 3.2 oz) 63 kg (139 lb)    Examination: General exam: Appears comfortable - alert and restless - HEENT: PERRLA, oral mucosa moist, no sclera icterus or thrush Respiratory system: Clear to auscultation. Respiratory effort normal. Cardiovascular system: S1 & S2 heard, RRR.  No murmurs  Gastrointestinal system: Abdomen  soft, non-tender, nondistended. Normal bowel sound. No organomegaly Central nervous system: Alert - oriented to person and place today No focal neurological deficits. Extremities: No cyanosis, clubbing or edema Skin: No rashes or ulcers Psychiatry:  Poor insight,   confused  and at times is restless     Data Reviewed: I have personally reviewed following labs and imaging studies  CBC:  Recent Labs Lab 06/18/17 0304 06/19/17 0447 06/22/17 0202  WBC 2.1* 3.1* 2.7*  NEUTROABS 1.1* 1.8  --   HGB 9.7* 10.1* 10.5*  HCT 29.4* 30.2* 30.9*  MCV 87.0 86.0 86.6  PLT 285 305 280   Basic Metabolic Panel:  Recent Labs Lab 06/20/17 0422 06/20/17 0431 06/21/17 0500 06/21/17 1840 06/22/17 0202 06/23/17 0416 06/24/17 0427  NA  --  134* 134* 134* 132* 133* 133*  K  --  4.3 3.8 3.8 3.9 4.2 4.0  CL  --  104 102 102 102 100* 100*  CO2  --  23 20* 22 23 25 26   GLUCOSE  --  90 103* 109* 96 87 88  BUN  --  10 14 14 18 17  21*  CREATININE  --  0.96 0.91 0.98 1.01 1.12 1.27*  CALCIUM  --  9.0 9.3 9.3 9.0 9.4 9.6  MG 2.2  --  1.8  --  1.5* 2.0 1.8  PHOS  --  5.9* 5.2*  --  5.5* 5.7* 5.6*   GFR: Estimated Creatinine Clearance: 78.7 mL/min (A) (by C-G formula based on SCr of 1.27 mg/dL (H)). Liver Function Tests:  Recent Labs Lab 06/19/17 0447  06/21/17 0500 06/21/17 1840 06/22/17 0202 06/23/17 0416 06/24/17 0427  AST 96*  --   --  117*  --   --   --   ALT 92*  --   --  122*  --   --   --   ALKPHOS 169*  --   --  180*  --   --   --   BILITOT 0.5  --   --  0.4  --   --   --   PROT 7.8  --   --  9.4*  --   --   --   ALBUMIN 2.9*  < > 3.1* 3.2* 3.1* 3.2* 3.3*  < > = values in this interval not displayed. No results for input(s): LIPASE, AMYLASE in the last 168 hours. No results for input(s): AMMONIA in the last 168 hours. Coagulation Profile: No results for input(s): INR, PROTIME in the last 168 hours. Cardiac Enzymes: No results for input(s): CKTOTAL, CKMB, CKMBINDEX, TROPONINI in the last 168 hours. BNP (last 3 results) No results for input(s): PROBNP in the last 8760 hours. HbA1C: No results for input(s): HGBA1C in the last 72 hours. CBG:  Recent Labs Lab 06/23/17 1128 06/23/17 1610 06/23/17 2104 06/24/17 0611 06/24/17 1137  GLUCAP 175*  83 88 91 98   Lipid Profile: No results for input(s): CHOL, HDL, LDLCALC, TRIG, CHOLHDL, LDLDIRECT in the last 72 hours. Thyroid Function Tests: No results for input(s): TSH, T4TOTAL, FREET4, T3FREE, THYROIDAB in the last 72 hours. Anemia Panel: No results for input(s): VITAMINB12, FOLATE, FERRITIN, TIBC, IRON, RETICCTPCT in the last 72 hours. Urine analysis:    Component Value Date/Time   COLORURINE YELLOW 05/20/2017 1742   APPEARANCEUR CLEAR 05/20/2017 1742   LABSPEC 1.020 05/20/2017 1742   PHURINE 7.0 05/20/2017 1742   GLUCOSEU NEGATIVE 05/20/2017 1742   HGBUR NEGATIVE 05/20/2017 1742   BILIRUBINUR NEGATIVE 05/20/2017 1742  KETONESUR NEGATIVE 05/20/2017 1742   PROTEINUR NEGATIVE 05/20/2017 1742   UROBILINOGEN 4.0 (H) 09/09/2014 1055   NITRITE NEGATIVE 05/20/2017 1742   LEUKOCYTESUR NEGATIVE 05/20/2017 1742   Sepsis Labs: @LABRCNTIP (procalcitonin:4,lacticidven:4) )No results found for this or any previous visit (from the past 240 hour(s)).       Radiology Studies: No results found.    Scheduled Meds: . azithromycin  1,200 mg Per Tube Weekly  . chlorhexidine  15 mL Mouth Rinse BID  . Chlorhexidine Gluconate Cloth  6 each Topical Daily  . feeding supplement (ENSURE ENLIVE)  237 mL Oral TID BM  . fluconazole  400 mg Oral Daily  . free water  200 mL Oral Q8H  . insulin aspart  2-6 Units Subcutaneous Q8H  . levETIRAcetam  1,000 mg Oral BID  . mouth rinse  15 mL Mouth Rinse q12n4p  . QUEtiapine  25 mg Oral BID  . sulfamethoxazole-trimethoprim  10 mL Per Tube Once per day on Mon Wed Fri  . tamsulosin  0.4 mg Oral Daily   Continuous Infusions: . sodium chloride Stopped (06/05/17 0200)     LOS: 35 days    Time spent in minutes: 35    Calvert Cantor, MD [facility] Pager: [email or web address] Password [email or web address] 06/24/2017, 2:15 PM

## 2017-06-24 NOTE — Progress Notes (Signed)
Subjective:  Pt no new complaints,    Antibiotics:  Anti-infectives    Start     Dose/Rate Route Frequency Ordered Stop   06/25/17 1000  dolutegravir (TIVICAY) tablet 50 mg     50 mg Oral Daily 06/24/17 1616     06/25/17 0800  emtricitabine-rilpivir-tenofovir AF (ODEFSEY) 200-25-25 MG per tablet 1 tablet  Status:  Discontinued     1 tablet Oral Daily with breakfast 06/24/17 1616 06/24/17 1617   06/25/17 0800  emtricitabine-rilpivir-tenofovir AF (ODEFSEY) 200-25-25 MG per tablet 1 tablet    Comments:  Give tomorrow am with TIVICAY and food   1 tablet Oral Daily with breakfast 06/24/17 1617     06/19/17 1200  fluconazole (DIFLUCAN) tablet 400 mg     400 mg Oral Daily 06/19/17 1045     06/18/17 0900  sulfamethoxazole-trimethoprim (BACTRIM,SEPTRA) 200-40 MG/5ML suspension 10 mL     10 mL Per Tube Once per day on Mon Wed Fri 06/16/17 1541     06/13/17 1500  linezolid (ZYVOX) tablet 600 mg  Status:  Discontinued     600 mg Oral Every 12 hours 06/13/17 1427 06/15/17 1333   06/12/17 1354  bacitracin 50,000 Units in sodium chloride irrigation 0.9 % 500 mL irrigation  Status:  Discontinued       As needed 06/12/17 1355 06/12/17 1430   05/31/17 1000  azithromycin (ZITHROMAX) 200 MG/5ML suspension 1,200 mg     1,200 mg Per Tube Weekly 05/24/17 1315     05/30/17 1130  linezolid (ZYVOX) IVPB 600 mg  Status:  Discontinued     600 mg 300 mL/hr over 60 Minutes Intravenous Every 12 hours 05/30/17 1127 06/12/17 1656   05/26/17 1215  doxycycline (VIBRAMYCIN) 100 mg in dextrose 5 % 250 mL IVPB  Status:  Discontinued     100 mg 125 mL/hr over 120 Minutes Intravenous Every 12 hours 05/26/17 1212 06/01/17 1448   05/26/17 0554  cefTRIAXone (ROCEPHIN) 2 g in dextrose 5 % 50 mL IVPB  Status:  Discontinued     2 g 100 mL/hr over 30 Minutes Intravenous Every 24 hours 05/25/17 1358 05/31/17 1323   05/24/17 1230  azithromycin (ZITHROMAX) 1,200 mg in dextrose 5 % 250 mL IVPB     1,200 mg 250 mL/hr  over 60 Minutes Intravenous Weekly 05/24/17 1128 05/24/17 1407   05/21/17 0800  emtricitabine-rilpivir-tenofovir AF (ODEFSEY) 200-25-25 MG per tablet 1 tablet  Status:  Discontinued     1 tablet Oral Daily with breakfast 05/20/17 1813 05/20/17 1909   05/21/17 0600  vancomycin (VANCOCIN) IVPB 1000 mg/200 mL premix  Status:  Discontinued     1,000 mg 200 mL/hr over 60 Minutes Intravenous Every 8 hours 05/21/17 0549 05/21/17 1400   05/21/17 0545  cefTRIAXone (ROCEPHIN) 2 g in dextrose 5 % 50 mL IVPB  Status:  Discontinued     2 g 100 mL/hr over 30 Minutes Intravenous Every 12 hours 05/21/17 0531 05/25/17 1358   05/21/17 0100  cefTRIAXone (ROCEPHIN) 2 g in dextrose 5 % 50 mL IVPB  Status:  Discontinued     2 g 100 mL/hr over 30 Minutes Intravenous Every 12 hours 05/20/17 1245 05/20/17 1919   05/20/17 2100  vancomycin (VANCOCIN) IVPB 1000 mg/200 mL premix  Status:  Discontinued     1,000 mg 200 mL/hr over 60 Minutes Intravenous Every 8 hours 05/20/17 1245 05/20/17 1918   05/20/17 2000  ampicillin (OMNIPEN) 1 g in sodium chloride 0.9 %  50 mL IVPB  Status:  Discontinued     1 g 150 mL/hr over 20 Minutes Intravenous Every 4 hours 05/20/17 1833 05/20/17 1836   05/20/17 2000  ampicillin (OMNIPEN) 2 g in sodium chloride 0.9 % 50 mL IVPB  Status:  Discontinued     2 g 150 mL/hr over 20 Minutes Intravenous Every 4 hours 05/20/17 1836 05/20/17 1919   05/20/17 2000  sulfamethoxazole-trimethoprim (BACTRIM,SEPTRA) 200-40 MG/5ML suspension 10 mL  Status:  Discontinued     10 mL Per Tube Daily 05/20/17 1901 06/16/17 1541   05/20/17 2000  emtricitabine-tenofovir AF (DESCOVY) 200-25 MG per tablet 1 tablet  Status:  Discontinued     1 tablet Oral Daily 05/20/17 1909 05/20/17 1919   05/20/17 2000  rilpivirine (EDURANT) tablet 25 mg  Status:  Discontinued     25 mg Per Tube Daily with breakfast 05/20/17 1909 05/20/17 1919   05/20/17 2000  flucytosine (ANCOBON) capsule 1,500 mg  Status:  Discontinued     1,500 mg  Per Tube Every 6 hours 05/20/17 1926 06/16/17 1541   05/20/17 1815  dolutegravir (TIVICAY) tablet 50 mg  Status:  Discontinued     50 mg Oral Daily 05/20/17 1813 05/20/17 1919   05/20/17 1815  sulfamethoxazole-trimethoprim (BACTRIM,SEPTRA) 400-80 MG per tablet 1 tablet  Status:  Discontinued     1 tablet Oral Daily 05/20/17 1813 05/20/17 1901   05/20/17 1800  flucytosine (ANCOBON) capsule 1,500 mg  Status:  Discontinued     25 mg/kg  63.5 kg Per Tube Every 6 hours 05/20/17 1645 05/20/17 1926   05/20/17 1700  amphotericin B liposome (AMBISOME) 250 mg in dextrose 5 % 500 mL IVPB  Status:  Discontinued     4 mg/kg  63.5 kg 250 mL/hr over 120 Minutes Intravenous Every 24 hours 05/20/17 1645 06/19/17 1045   05/20/17 1300  ampicillin (OMNIPEN) 2 g in sodium chloride 0.9 % 50 mL IVPB  Status:  Discontinued     2 g 150 mL/hr over 20 Minutes Intravenous  Once 05/20/17 1237 05/20/17 1242   05/20/17 1300  acyclovir (ZOVIRAX) 635 mg in dextrose 5 % 100 mL IVPB  Status:  Discontinued     10 mg/kg  63.5 kg 112.7 mL/hr over 60 Minutes Intravenous Every 8 hours 05/20/17 1243 05/20/17 1918   05/20/17 1245  cefTRIAXone (ROCEPHIN) 2 g in dextrose 5 % 50 mL IVPB     2 g 100 mL/hr over 30 Minutes Intravenous  Once 05/20/17 1237 05/20/17 1327   05/20/17 1245  vancomycin (VANCOCIN) IVPB 1000 mg/200 mL premix     1,000 mg 200 mL/hr over 60 Minutes Intravenous  Once 05/20/17 1237 05/20/17 1420      Medications: Scheduled Meds: . azithromycin  1,200 mg Per Tube Weekly  . chlorhexidine  15 mL Mouth Rinse BID  . Chlorhexidine Gluconate Cloth  6 each Topical Daily  . [START ON 06/25/2017] dolutegravir  50 mg Oral Daily  . [START ON 06/25/2017] emtricitabine-rilpivir-tenofovir AF  1 tablet Oral Q breakfast  . feeding supplement (ENSURE ENLIVE)  237 mL Oral TID BM  . fluconazole  400 mg Oral Daily  . levETIRAcetam  1,000 mg Oral BID  . mouth rinse  15 mL Mouth Rinse q12n4p  . QUEtiapine  25 mg Oral BID  .  sulfamethoxazole-trimethoprim  10 mL Per Tube Once per day on Mon Wed Fri   Continuous Infusions: . sodium chloride Stopped (06/05/17 0200)   PRN Meds:.sodium chloride, acetaminophen, sodium chloride flush  Objective: Weight change:   Intake/Output Summary (Last 24 hours) at 06/24/17 1618 Last data filed at 06/24/17 1427  Gross per 24 hour  Intake              720 ml  Output             2050 ml  Net            -1330 ml   Blood pressure 98/67, pulse (!) 107, temperature 98.1 F (36.7 C), temperature source Oral, resp. rate 18, height 6\' 1"  (1.854 m), weight 139 lb (63 kg), SpO2 99 %. Temp:  [97.8 F (36.6 C)-98.5 F (36.9 C)] 98.1 F (36.7 C) (07/01 1427) Pulse Rate:  [101-112] 107 (07/01 1427) Resp:  [18-20] 18 (07/01 1427) BP: (93-134)/(59-68) 98/67 (07/01 1427) SpO2:  [98 %-100 %] 99 % (07/01 1427)  Physical Exam: General: Alert and awake, person seemed more confused today HEENT:  EOMI, surgical scar clean CVS bradycardic rate, normal r,  no murmur rubs or gallops Chest: clear to auscultation bilaterally, no wheezing, rales or rhonchi Abdomen: soft  nondistended, normal bowel sounds, Extremities: no  clubbing or edema noted bilaterally Skin: chronic hyperpigmented lesions on legs (could be KS) Neuro: nonfocal  CBC: CBC Latest Ref Rng & Units 06/22/2017 06/19/2017 06/18/2017  WBC 4.0 - 10.5 K/uL 2.7(L) 3.1(L) 2.1(L)  Hemoglobin 13.0 - 17.0 g/dL 10.5(L) 10.1(L) 9.7(L)  Hematocrit 39.0 - 52.0 % 30.9(L) 30.2(L) 29.4(L)  Platelets 150 - 400 K/uL 280 305 285      BMET  Recent Labs  06/23/17 0416 06/24/17 0427  NA 133* 133*  K 4.2 4.0  CL 100* 100*  CO2 25 26  GLUCOSE 87 88  BUN 17 21*  CREATININE 1.12 1.27*  CALCIUM 9.4 9.6     Liver Panel   Recent Labs  06/21/17 1840  06/23/17 0416 06/24/17 0427  PROT 9.4*  --   --   --   ALBUMIN 3.2*  < > 3.2* 3.3*  AST 117*  --   --   --   ALT 122*  --   --   --   ALKPHOS 180*  --   --   --   BILITOT 0.4   --   --   --   < > = values in this interval not displayed.     Sedimentation Rate No results for input(s): ESRSEDRATE in the last 72 hours. C-Reactive Protein No results for input(s): CRP in the last 72 hours.  Micro Results: Recent Results (from the past 720 hour(s))  CSF culture with Stat gram stain     Status: None   Collection Time: 05/29/17 12:19 PM  Result Value Ref Range Status   Specimen Description CSF  Final   Special Requests NONE  Final   Gram Stain   Final    NO WBC SEEN YEAST CORRECTED RESULTS PREVIOUSLY REPORTED AS: NO ORGANISMS SEEN CORRECTED RESULTS CALLED TO: M HARPER,RN AT 1525 05/29/17 BY L BENFIELD    Culture   Final    FEW STAPHYLOCOCCUS EPIDERMIDIS CRITICAL RESULT CALLED TO, READ BACK BY AND VERIFIED WITH: DR Drue Second 05/30/17 @ 1119 M VESTAL    Report Status 06/05/2017 FINAL  Final   Organism ID, Bacteria STAPHYLOCOCCUS EPIDERMIDIS  Final      Susceptibility   Staphylococcus epidermidis - MIC*    CIPROFLOXACIN <=0.5 SENSITIVE Sensitive     ERYTHROMYCIN >=8 RESISTANT Resistant     GENTAMICIN <=0.5 SENSITIVE Sensitive  OXACILLIN >=4 RESISTANT Resistant     TETRACYCLINE 2 SENSITIVE Sensitive     VANCOMYCIN 2 SENSITIVE Sensitive     TRIMETH/SULFA >=320 RESISTANT Resistant     CLINDAMYCIN >=8 RESISTANT Resistant     RIFAMPIN <=0.5 SENSITIVE Sensitive     Inducible Clindamycin NEGATIVE Sensitive     * FEW STAPHYLOCOCCUS EPIDERMIDIS  CSF culture     Status: None   Collection Time: 06/05/17  9:39 AM  Result Value Ref Range Status   Specimen Description CSF  Final   Special Requests Immunocompromised  Final   Gram Stain   Final    WBC PRESENT, PREDOMINANTLY MONONUCLEAR BUDDING YEAST SEEN CRITICAL RESULT CALLED TO, READ BACK BY AND VERIFIED WITH: Buckman RN 10:20 06/05/17 ([email or web address])    Culture RARE CRYPTOCOCCUS NEOFORMANS  Final   Report Status 06/12/2017 FINAL  Final  Fungus Culture With Stain     Status: None (Preliminary result)   Collection Time:  06/05/17  9:40 AM  Result Value Ref Range Status   Fungus Stain Final report  Final    Comment: (NOTE) Performed At: [facility] [address] [place], [name] [id number] Mila Homer MD NW:[id number]    Fungus (Mycology) Culture PENDING  Incomplete   Fungal Source CSF  Final  Fungus Culture Result     Status: None   Collection Time: 06/05/17  9:40 AM  Result Value Ref Range Status   Result 1 Yeast observed  Final    Comment: (NOTE) POSITIVE SMEAR REPORTED TO AMANDA L. AT 1101 ON 06/07/17 BY AM. Performed At: [facility] [address] [place], [name] [id number] Mila Homer MD BM:[id number]   CSF culture with Stat gram stain     Status: None   Collection Time: 06/08/17  1:28 PM  Result Value Ref Range Status   Specimen Description CSF  Final   Special Requests Immunocompromised TUBE 2  Final   Gram Stain   Final    WBC PRESENT, PREDOMINANTLY MONONUCLEAR CYTOSPIN SMEAR CORRECTED RESULTS BUDDING YEAST SEEN PREVIOUSLY REPORTED AS: NO ORGANISMS SEEN CORRECTED RESULTS CALLED TO: RN Patsey Berthold [id number] MLM    Culture NO GROWTH 7 DAYS  Final   Report Status 06/15/2017 FINAL  Final  Culture, fungus without smear     Status: None (Preliminary result)   Collection Time: 06/08/17  1:28 PM  Result Value Ref Range Status   Specimen Description CSF  Final   Special Requests Immunocompromised TUBE 2  Final   Culture NO FUNGUS ISOLATED AFTER 14 DAYS  Final   Report Status PENDING  Incomplete    Studies/Results: No results found.    Assessment/Plan:  INTERVAL HISTORY: pt neering 5 weeks of antifungal therapy   Principal Problem:   Seizure (HCC) Active Problems:   HIV (human immunodeficiency virus infection) (HCC)   Rash and nonspecific skin eruption   Insomnia   Bipolar disorder (HCC)   Intersphincteric abscess   AIDS (HCC)   Headache   Meningoencephalitis   Respiratory failure (HCC)   Encephalitis   Patient has nasogastric tube    History of syphilis   Tobacco abuse   Tachypnea   Intracranial pressure increased   Status epilepticus (HCC)   History of brain shunt    Justin Ball is a 26 y.o. 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 Continue OI prophylaxis, will need to watch for IRIS as his ARV kicks in  I have ordered ARV to stop tomorrow with meal  #3 Cognitive insult: HIV insult to CNS directly could also play a role so will need to bring virus under  Control  #4 Chronic Hep B: will be on TAF-FTC regimen  Dr. Orvan Falconerampbell to take over the service in the am.   LOS: 35 days   Acey LavCornelius Van Dam 06/24/2017, 4:18 PM

## 2017-06-25 ENCOUNTER — Ambulatory Visit: Payer: No Typology Code available for payment source | Admitting: Infectious Disease

## 2017-06-25 DIAGNOSIS — Z982 Presence of cerebrospinal fluid drainage device: Secondary | ICD-10-CM

## 2017-06-25 LAB — RENAL FUNCTION PANEL
ALBUMIN: 3.2 g/dL — AB (ref 3.5–5.0)
Anion gap: 6 (ref 5–15)
BUN: 23 mg/dL — ABNORMAL HIGH (ref 6–20)
CALCIUM: 9.2 mg/dL (ref 8.9–10.3)
CO2: 26 mmol/L (ref 22–32)
CREATININE: 1.12 mg/dL (ref 0.61–1.24)
Chloride: 102 mmol/L (ref 101–111)
GFR calc non Af Amer: >60 mL/min (ref >60)
Glucose, Bld: 95 mg/dL (ref 65–99)
Phosphorus: 5 mg/dL — ABNORMAL HIGH (ref 2.5–4.6)
Potassium: 3.8 mmol/L (ref 3.5–5.1)
SODIUM: 134 mmol/L — AB (ref 135–145)

## 2017-06-25 LAB — MAGNESIUM: Magnesium: 1.9 mg/dL (ref 1.7–2.4)

## 2017-06-25 MED ORDER — ALTEPLASE 2 MG IJ SOLR
2.0000 mg | INTRAMUSCULAR | Status: AC | PRN
  Administered 2017-06-25 (×2): 2 mg
  Filled 2017-06-25 (×3): qty 2

## 2017-06-25 MED ORDER — PRO-STAT SUGAR FREE PO LIQD
30.0000 mL | Freq: Three times a day (TID) | ORAL | Status: DC
  Administered 2017-06-25 – 2017-06-27 (×6): 30 mL via ORAL
  Filled 2017-06-25 (×6): qty 30

## 2017-06-25 MED ORDER — LORAZEPAM 2 MG/ML IJ SOLN
0.5000 mg | Freq: Four times a day (QID) | INTRAMUSCULAR | Status: DC | PRN
  Filled 2017-06-25: qty 1

## 2017-06-25 NOTE — Progress Notes (Signed)
Physical Therapy Treatment Patient Details Name: Justin Ball MRN: [id number] DOB: 01/27/1991 Today's Date: 06/25/2017    History of Present Illness Pt is a 26 year old man admitted s/p seizure activity having several days of recent headaches. 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 EVD was placed after acute decompensation on 5/28 with decorticate posturing and asymmetric pupils. 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 PMH includes: syphilis, substance abuse, soft palate ulceration, scabies, neuromuscular disorder, HIV/AIDS, depression, bipolar disorder, anxiety    PT Comments    Patient see for activity progression, tolerated ambulation with 2 person assist and gait retraining. Assist for upright functional task performance as well as sitting balance trunk activity. Patient limited by fatigue during session. Will continue to see and progress as tolerated.   Follow Up Recommendations  CIR     Equipment Recommendations  Wheelchair (measurements PT);Wheelchair cushion (measurements PT);Hospital bed    Recommendations for Other Services Rehab consult     Precautions / Restrictions Precautions Precautions: Fall    Mobility  Bed Mobility Overal bed mobility: Needs Assistance Bed Mobility: Supine to Sit     Supine to sit: Mod assist     General bed mobility comments: Mod A to swing both legs to floor and scoot hips forward  Transfers Overall transfer level: Needs assistance Equipment used: 2 person hand held assist Transfers: Sit to/from Stand Sit to Stand: Min assist;+2 safety/equipment         General transfer comment: Min assist to stand EOB and to support trunk during transition  Ambulation/Gait Ambulation/Gait assistance: +2 physical assistance;Mod assist Ambulation Distance (Feet): 110 Feet Assistive device: 2 person hand held assist Gait Pattern/deviations: Step-through  pattern;Scissoring;Staggering left;Narrow base of support Gait velocity: decreased Gait velocity interpretation: Below normal speed for age/gender General Gait Details: three musketeer approach with faciliation of gait speed and stride positioning. assist and cues for upright posture.    Stairs            Wheelchair Mobility    Modified Rankin (Stroke Patients Only)       Balance Overall balance assessment: Needs assistance Sitting-balance support: Feet supported Sitting balance-Leahy Scale: Fair     Standing balance support: Bilateral upper extremity supported Standing balance-Leahy Scale: Poor                              Cognition Arousal/Alertness: Awake/alert Behavior During Therapy: Restless;Agitated;Impulsive;Flat affect;Anxious Overall Cognitive Status: Impaired/Different from baseline Area of Impairment: Attention;Following commands;Safety/judgement;Awareness;Problem solving                   Current Attention Level: Sustained Memory: Decreased recall of precautions;Decreased short-term memory Following Commands: Follows one step commands inconsistently Safety/Judgement: Decreased awareness of safety;Decreased awareness of deficits Awareness: Intellectual Problem Solving: Slow processing;Decreased initiation;Difficulty sequencing;Requires tactile cues;Requires verbal cues General Comments: Attempted to sit without chair behind him      Exercises Other Exercises Other Exercises: SLRs with assist bilateral Other Exercises: KNee Flexion ROM bilaterall AAROM Other Exercises: Attempted ankle ROM but poorly recpetive to this activity    General Comments        Pertinent Vitals/Pain Pain Assessment: Faces Faces Pain Scale: Hurts little more Pain Location: unsure Pain Descriptors / Indicators: Grimacing Pain Intervention(s): Limited activity within patient's tolerance    Home Living                      Prior  Function             PT Goals (current goals can now be found in the care plan section) Acute Rehab PT Goals Patient Stated Goal: none stated PT Goal Formulation: With patient Time For Goal Achievement: 07/04/17 Potential to Achieve Goals: Good Progress towards PT goals: Progressing toward goals    Frequency    Min 3X/week      PT Plan Current plan remains appropriate    Co-evaluation PT/OT/SLP Co-Evaluation/Treatment: Yes Reason for Co-Treatment: Complexity of the patient's impairments (multi-system involvement);Necessary to address cognition/behavior during functional activity;To address functional/ADL transfers PT goals addressed during session: Mobility/safety with mobility OT goals addressed during session: ADL's and self-care      AM-PAC PT "6 Clicks" Daily Activity  Outcome Measure  Difficulty turning over in bed (including adjusting bedclothes, sheets and blankets)?: A Little Difficulty moving from lying on back to sitting on the side of the bed? : Total Difficulty sitting down on and standing up from a chair with arms (e.g., wheelchair, bedside commode, etc,.)?: Total Help needed moving to and from a bed to chair (including a wheelchair)?: A Little Help needed walking in hospital room?: A Lot Help needed climbing 3-5 steps with a railing? : A Lot 6 Click Score: 12    End of Session Equipment Utilized During Treatment: Gait belt Activity Tolerance: Patient tolerated treatment well Patient left: in bed;with call bell/phone within reach;with bed alarm set;with family/visitor present Nurse Communication: Mobility status PT Visit Diagnosis: Other symptoms and signs involving the nervous system (R29.898);Muscle weakness (generalized) (M62.81);Difficulty in walking, not elsewhere classified (R26.2)     Time: 1610-9604 PT Time Calculation (min) (ACUTE ONLY): 29 min  Charges:  $Gait Training: 8-22 mins                    G Codes:       Charlotte Crumb, PT DPT  NCS [phone]    Fabio Asa 06/25/2017, 4:48 PM

## 2017-06-25 NOTE — Progress Notes (Signed)
Nutrition Follow-up  DOCUMENTATION CODES:   Non-severe (moderate) malnutrition in context of chronic illness  INTERVENTION:  Continue Ensure Enlive po TID, each supplement provides 350 kcal and 20 grams of protein.  Provide 30 ml Prostat po TID, each supplement provides 100 kcal and 15 grams of protein.   Encourage adequate PO intake.   NUTRITION DIAGNOSIS:   Malnutrition (Moderate) related to chronic illness (AIDS) as evidenced by mild depletion of muscle mass, mild depletion of body fat; ongoing  GOAL:   Patient will meet greater than or equal to 90% of their needs; progressing  MONITOR:   PO intake, Supplement acceptance, Labs, Weight trends, I & O's  REASON FOR ASSESSMENT:   Consult Enteral/tube feeding initiation and management  ASSESSMENT:   Pt with PMH of bipolar 1 d/o, depression, HIV/AIDS medical noncompliance, chronic hepatitis B, substance abuse, and syphilis admitted with AMS 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  Pt reports having a lack of appetite during time of visit. No recent meal completion recorded. Pt currently has Ensure ordered and reports he would like to continue with them. RD to additionally order Prostat to aid in caloric and protein needs. Pt encouraged to eat his food at meals and to consume his supplements.   Labs and medications reviewed. Phosphorous elevated at 5.0.  Diet Order:  Diet regular Room service appropriate? Yes; Fluid consistency: Thin  Skin:   (Multiple incisions)  Last BM:  7/1  Height:   Ht Readings from Last 1 Encounters:  05/20/17 6\' 1"  (1.854 m)    Weight:   Wt Readings from Last 1 Encounters:  06/25/17 133 lb 14.4 oz (60.7 kg)    Ideal Body Weight:  83.6 kg  BMI:  Body mass index is 17.67 kg/m.  Estimated Nutritional Needs:   Kcal:  2100-2300  Protein:  100-125 grams  Fluid:  >/= 2.1 L/day  EDUCATION NEEDS:   No education needs identified at this time  Roslyn SmilingStephanie Vinette Crites,  MS, RD, LDN Pager # [phone] After hours/ weekend pager # [phone]

## 2017-06-25 NOTE — Progress Notes (Signed)
SLP Cancellation Note  Patient Details Name: Justin Ball MRN: [id number] DOB: 02/20/1991   Cancelled treatment:       Reason Eval/Treat Not Completed: Patient at procedure or test/unavailable. Pt working with OT/ PT. Will continue attempts.   Metro KungAmy K Oleksiak, MA, CCC-SLP 06/25/2017, 2:47 PM [phone]

## 2017-06-25 NOTE — Progress Notes (Signed)
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 He started antiretroviral treatment today. I do not know what his baseline mental status is. His physical therapist had not worked with him before today so they do not know if he has been improving recently.  PLAN: 1. Continue induction course of fluconazole 2. Continue Odefsey and Tivicay 3. Continue prophylactic doses of azithromycin and trimethoprim sulfamethoxazole 4. Continue physical therapy 5. 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 azithromycin  1,200 mg Per Tube Weekly  . chlorhexidine  15 mL Mouth Rinse BID  . Chlorhexidine Gluconate Cloth  6 each Topical Daily  . dolutegravir  50 mg Oral Daily  . emtricitabine-rilpivir-tenofovir AF  1 tablet Oral Q breakfast  . feeding supplement (ENSURE ENLIVE)  237 mL Oral TID BM  . feeding supplement (PRO-STAT SUGAR FREE 64)  30 mL Oral TID BM  . fluconazole  400 mg Oral Daily  . levETIRAcetam  1,000 mg Oral BID  . mouth rinse  15 mL Mouth Rinse q12n4p  . QUEtiapine  25 mg Oral BID  . sulfamethoxazole-trimethoprim  10 mL Per Tube Once per day on Mon Wed Fri   Review of Systems: Review of Systems  Unable to perform ROS: Mental acuity    Allergies  Allergen  Reactions  . No Known Allergies     OBJECTIVE: Vitals:   06/25/17 0500 06/25/17 0522 06/25/17 0900 06/25/17 1342  BP:  96/67 102/75 93/64  Pulse:  (!) 104 (!) 112 86  Resp:  18 16 16   Temp:  98.8 F (37.1 C) 98.3 F (36.8 C) 98.1 F (36.7 C)  TempSrc:  Oral Oral Oral  SpO2:  100% 100% 100%  Weight: 133 lb 14.4 oz (60.7 kg)     Height:       Body mass index is 17.67 kg/m.  Physical Exam  Constitutional:  He just completed working with physical therapy. I think he whispered to me that he was tired but I could not get anything else out of him.  Cardiovascular: Normal rate and regular rhythm.   No murmur heard. Pulmonary/Chest: Effort normal and breath sounds normal. He has no wheezes. He has no rales.  VP shunt seen easily on the right side of his neck going over his right clavicle.  Abdominal: Soft. There is no tenderness.  Neurological: He is alert.    Lab Results Lab Results  Component Value Date   WBC 2.7 (L) 06/22/2017   HGB 10.5 (L) 06/22/2017   HCT 30.9 (L) 06/22/2017   MCV 86.6 06/22/2017   PLT 280 06/22/2017    Lab Results  Component  Value Date   CREATININE 1.12 06/25/2017   BUN 23 (H) 06/25/2017   NA 134 (L) 06/25/2017   K 3.8 06/25/2017   CL 102 06/25/2017   CO2 26 06/25/2017    Lab Results  Component Value Date   ALT 122 (H) 06/21/2017   AST 117 (H) 06/21/2017   ALKPHOS 180 (H) 06/21/2017   BILITOT 0.4 06/21/2017    HIV 1 RNA Quant (copies/mL)  Date Value  05/20/2017 83,400  04/28/2015 39,128 (H)  01/19/2015 1,029 (H)   CD4 T Cell Abs (/uL)  Date Value  04/18/2017 10 (L)  09/12/2016 20 (L)  04/28/2015 90 (L)   Microbiology: No results found for this or any previous visit (from the past 240 hour(s)).  Justin AstersJohn Remy Voiles, MD [facility] [phone] pager   [phone] cell 06/25/2017, 4:45 PM

## 2017-06-25 NOTE — Progress Notes (Addendum)
CSW contacted patient's mother via telephone to discuss bed offers and obtain preference. Patient's mother discussed that she had visited [facility] but did not like the facility, she would be looking at the others. CSW explained that the patient had been doing better and that he could hopefully DC tomorrow. Patient's mother questioned what had happened with the patient's medical status, and CSW directed her to contact the nurse or the doctor to ask about medical questions. Patient's mother asked about CSW role, and CSW explained role in discharge planning and coordinating processes. Patient's mother indicated understanding, and said she would follow up with medical staff with her questions.  CSW indicated to patient's mother that she would need to pick a facility tonight, and inform CSW tomorrow so that discharge could be facilitated. CSW will follow up with patient's mother in the morning.  Laveda Abbe, [place] Clinical Social Worker [phone]  UPDATE 5:05 PM:  CSW contacted by patient's mother, who had arrived at the hospital. Patient's mother had questions about home health services instead of SNF, and CSW alerted RNCM to assist with discussion. CSW and RNCM met with patient's mother at bedside and discussed discharge planning. Patient's mother received information on discharge options, and decided to continue to pursue SNF placement at this time. Patient's mother indicated preference for placement. CSW and RNCM explained that a back-up option may be necessary, just in case beds aren't available or insurance authorization isn't provided. Patient's mother indicated understanding.  CSW will follow up tomorrow on confirming bed with [facility] and initiating insurance authorization.  Laveda Abbe, [place] Clinical Social Worker [phone]

## 2017-06-25 NOTE — Progress Notes (Signed)
PROGRESS NOTE    Justin Ball   WUJ:[id number]  DOB: 1991/11/18  DOA: 05/20/2017 PCP: Randall Hiss, MD   Brief Narrative:   26 y/o with AIDS, Syphilis, Chronic Hep B, substance abuse, cigarette smoker, Bipolar disorder and poor compliance with treatment presented to ER on 5/27 intially unresponsive and then noted to be having seizures int he ER. He had been seen in the ER 3 day prior for a headache, underwent a CT of the head which was unrevealing and was discharged from the ER. Apparently he was also having fevers at home. 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 This has been treated surgically with a VP shunt (6/19 by Dr Rolanda Jay) and medically treated with Flucytosine which was switched to Amphotericin due to leukopenia. Amphotericin transitioned to Fluconazole on 6/26.  Seizures being managed with Keppra.  Further complications included Strep pneumo bacteremia, metabolic encephalopathy, dysphagia requiring nutrition via NG tube CD4 count is 10 and he is maintained on Bactrim prophylaxis.    Subjective: Refuses to talk with me today.   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 Rolanda Jay 6/19 - S/p ventriculostomy 5/28-6/8 to prevent uncal herniation - Keppra per neurology for seizure   - EVD removed 6/8   - Continue treatment per ID- now on Fluconazole 400 mg daily to complete 5 wks  U retention voiding trial 6/25 but had to be recatheterized 6/26 - failed voiding trail again - foley replaced- I have spoken with Urology, Dr Wilson Singer who suspect that he may have a bladder stretch injury and may need a chronic foley-  Dr Annabell Howells is recommending that patient to f/u with Urology as outpt- discussed with grandmother  Dysphagia secondary to acute metabolic  encephalopathy - NG tube removed - oral intake varies day to day but he no longer has dysphagia   Acute encephalopathy:   -  meningoencephalitis, seizures, ischemic infarcts (no antiplatelet per neuro), metabolic derangements and medications    -Numerous scattered areas of ischemia on MRI   Strep epidermidis in CSF - has completed Linezolid per ID x14 days (6/6 - last dose on 6/21)  - TTE ordered: No vegetations noted   Strep pneumoniae bacteremia - treated with Ceftriaxone  Bordatella pneumonia - Was treated with doxycycline    AIDS - CD4 count April 2018 was 2.  - ARV being resumed today per ID- Tivicay, Odefsey- will need to avoid PPI, H2 blockers, Mg+, Ca+ supplements - Continue bactrim3 x /wk and weekly azithromycin ppx -Starting ART 628/2018? Per ID  Chronic Hep B - high viral load but LFTs normal - TAF-FTS per ID  Neutropenia, is afebrile - Suspected due to flucytosine.  - steadily improving  Diarrhea:  - Acute, likely from tube feeds and has resolved - Flexiseal d/c 6/23  Chronic hepatitis B - Follow up with ID , LFTs minimally elevated AST ALT 90s, alkaline phosphatase 169 6/26  Malnutrition- severe - cont to follow nutrition- intake is erratic- giving nutritional supplements  - Prealbumin 6/26 is reasonable at 24  Bordatella pneumonia - Was treated with doxycycline per ID   Severe debility - unable to walk and will need significant amount of PT at SNF  DVT prophylaxis: SCD Code Status: Full code Family Communication: mother, grandmother Disposition Plan: SNF Consultants:    PCCM   ID  NS  Neurology Procedures:  V-P shunt  Intubation  LP  Video EEG Antimicrobials:  Anti-infectives    Start     Dose/Rate Route Frequency Ordered Stop   06/25/17 1000  dolutegravir (TIVICAY) tablet 50 mg     50 mg Oral Daily 06/24/17 1616     06/25/17 0800  emtricitabine-rilpivir-tenofovir AF (ODEFSEY) 200-25-25 MG per tablet 1 tablet  Status:   Discontinued     1 tablet Oral Daily with breakfast 06/24/17 1616 06/24/17 1617   06/25/17 0800  emtricitabine-rilpivir-tenofovir AF (ODEFSEY) 200-25-25 MG per tablet 1 tablet    Comments:  Give tomorrow am with TIVICAY and food   1 tablet Oral Daily with breakfast 06/24/17 1617     06/19/17 1200  fluconazole (DIFLUCAN) tablet 400 mg     400 mg Oral Daily 06/19/17 1045     06/18/17 0900  sulfamethoxazole-trimethoprim (BACTRIM,SEPTRA) 200-40 MG/5ML suspension 10 mL     10 mL Per Tube Once per day on Mon Wed Fri 06/16/17 1541     06/13/17 1500  linezolid (ZYVOX) tablet 600 mg  Status:  Discontinued     600 mg Oral Every 12 hours 06/13/17 1427 06/15/17 1333   06/12/17 1354  bacitracin 50,000 Units in sodium chloride irrigation 0.9 % 500 mL irrigation  Status:  Discontinued       As needed 06/12/17 1355 06/12/17 1430   05/31/17 1000  azithromycin (ZITHROMAX) 200 MG/5ML suspension 1,200 mg     1,200 mg Per Tube Weekly 05/24/17 1315     05/30/17 1130  linezolid (ZYVOX) IVPB 600 mg  Status:  Discontinued     600 mg 300 mL/hr over 60 Minutes Intravenous Every 12 hours 05/30/17 1127 06/12/17 1656   05/26/17 1215  doxycycline (VIBRAMYCIN) 100 mg in dextrose 5 % 250 mL IVPB  Status:  Discontinued     100 mg 125 mL/hr over 120 Minutes Intravenous Every 12 hours 05/26/17 1212 06/01/17 1448   05/26/17 0554  cefTRIAXone (ROCEPHIN) 2 g in dextrose 5 % 50 mL IVPB  Status:  Discontinued     2 g 100 mL/hr over 30 Minutes Intravenous Every 24 hours 05/25/17 1358 05/31/17 1323   05/24/17 1230  azithromycin (ZITHROMAX) 1,200 mg in dextrose 5 % 250 mL IVPB     1,200 mg 250 mL/hr over 60 Minutes Intravenous Weekly 05/24/17 1128 05/24/17 1407   05/21/17 0800  emtricitabine-rilpivir-tenofovir AF (ODEFSEY) 200-25-25 MG per tablet 1 tablet  Status:  Discontinued     1 tablet Oral Daily with breakfast 05/20/17 1813 05/20/17 1909   05/21/17 0600  vancomycin (VANCOCIN) IVPB 1000 mg/200 mL premix  Status:  Discontinued      1,000 mg 200 mL/hr over 60 Minutes Intravenous Every 8 hours 05/21/17 0549 05/21/17 1400   05/21/17 0545  cefTRIAXone (ROCEPHIN) 2 g in dextrose 5 % 50 mL IVPB  Status:  Discontinued     2 g 100 mL/hr over 30 Minutes Intravenous Every 12 hours 05/21/17 0531 05/25/17 1358   05/21/17 0100  cefTRIAXone (ROCEPHIN) 2 g in dextrose 5 % 50 mL IVPB  Status:  Discontinued     2 g 100 mL/hr over 30 Minutes Intravenous Every 12 hours 05/20/17 1245 05/20/17 1919   05/20/17 2100  vancomycin (VANCOCIN) IVPB 1000 mg/200 mL premix  Status:  Discontinued     1,000 mg 200 mL/hr over 60 Minutes Intravenous Every 8 hours 05/20/17 1245 05/20/17 1918   05/20/17 2000  ampicillin (OMNIPEN) 1 g in sodium chloride 0.9 % 50 mL IVPB  Status:  Discontinued     1 g 150 mL/hr over 20 Minutes Intravenous Every 4 hours 05/20/17 1833 05/20/17 1836   05/20/17 2000  ampicillin (OMNIPEN) 2 g in sodium chloride 0.9 % 50 mL IVPB  Status:  Discontinued     2 g 150 mL/hr over 20 Minutes Intravenous Every 4 hours 05/20/17 1836 05/20/17 1919   05/20/17 2000  sulfamethoxazole-trimethoprim (BACTRIM,SEPTRA) 200-40 MG/5ML suspension 10 mL  Status:  Discontinued     10 mL Per Tube Daily 05/20/17 1901 06/16/17 1541   05/20/17 2000  emtricitabine-tenofovir AF (DESCOVY) 200-25 MG per tablet 1 tablet  Status:  Discontinued     1 tablet Oral Daily 05/20/17 1909 05/20/17 1919   05/20/17 2000  rilpivirine (EDURANT) tablet 25 mg  Status:  Discontinued     25 mg Per Tube Daily with breakfast 05/20/17 1909 05/20/17 1919   05/20/17 2000  flucytosine (ANCOBON) capsule 1,500 mg  Status:  Discontinued     1,500 mg Per Tube Every 6 hours 05/20/17 1926 06/16/17 1541   05/20/17 1815  dolutegravir (TIVICAY) tablet 50 mg  Status:  Discontinued     50 mg Oral Daily 05/20/17 1813 05/20/17 1919   05/20/17 1815  sulfamethoxazole-trimethoprim (BACTRIM,SEPTRA) 400-80 MG per tablet 1 tablet  Status:  Discontinued     1 tablet Oral Daily 05/20/17 1813  05/20/17 1901   05/20/17 1800  flucytosine (ANCOBON) capsule 1,500 mg  Status:  Discontinued     25 mg/kg  63.5 kg Per Tube Every 6 hours 05/20/17 1645 05/20/17 1926   05/20/17 1700  amphotericin B liposome (AMBISOME) 250 mg in dextrose 5 % 500 mL IVPB  Status:  Discontinued     4 mg/kg  63.5 kg 250 mL/hr over 120 Minutes Intravenous Every 24 hours 05/20/17 1645 06/19/17 1045   05/20/17 1300  ampicillin (OMNIPEN) 2 g in sodium chloride 0.9 % 50 mL IVPB  Status:  Discontinued     2 g 150 mL/hr over 20 Minutes Intravenous  Once 05/20/17 1237 05/20/17 1242   05/20/17 1300  acyclovir (ZOVIRAX) 635 mg in dextrose 5 % 100 mL IVPB  Status:  Discontinued     10 mg/kg  63.5 kg 112.7 mL/hr over 60 Minutes Intravenous Every 8 hours 05/20/17 1243 05/20/17 1918   05/20/17 1245  cefTRIAXone (ROCEPHIN) 2 g in dextrose 5 % 50 mL IVPB     2 g 100 mL/hr over 30 Minutes Intravenous  Once 05/20/17 1237 05/20/17 1327   05/20/17 1245  vancomycin (VANCOCIN) IVPB 1000 mg/200 mL premix     1,000 mg 200 mL/hr over 60 Minutes Intravenous  Once 05/20/17 1237 05/20/17 1420       Objective: Vitals:   06/25/17 0500 06/25/17 0522 06/25/17 0900 06/25/17 1342  BP:  96/67 102/75 93/64  Pulse:  (!) 104 (!) 112 86  Resp:  18 16 16   Temp:  98.8 F (37.1 C) 98.3 F (36.8 C) 98.1 F (36.7 C)  TempSrc:  Oral Oral Oral  SpO2:  100% 100% 100%  Weight: 60.7 kg (133 lb 14.4 oz)     Height:        Intake/Output Summary (Last 24 hours) at 06/25/17 1555 Last data filed at 06/25/17 0523  Gross per 24 hour  Intake                5 ml  Output             1000 ml  Net             -  995 ml   Filed Weights   06/20/17 0521 06/22/17 0600 06/25/17 0500  Weight: 65 kg (143 lb 3.2 oz) 63 kg (139 lb) 60.7 kg (133 lb 14.4 oz)    Examination: General exam: Appears comfortable - alert and restless - HEENT: PERRLA, oral mucosa moist, no sclera icterus or thrush Respiratory system: Clear to auscultation. Respiratory effort  normal. Cardiovascular system: S1 & S2 heard, RRR.  No murmurs  Gastrointestinal system: Abdomen soft, non-tender, nondistended. Normal bowel sound. No organomegaly Central nervous system: Alert - oriented to person and place today No focal neurological deficits. Extremities: No cyanosis, clubbing or edema Skin: No rashes or ulcers Psychiatry:  Poor insight,   confused and at times is restless     Data Reviewed: I have personally reviewed following labs and imaging studies  CBC:  Recent Labs Lab 06/19/17 0447 06/22/17 0202  WBC 3.1* 2.7*  NEUTROABS 1.8  --   HGB 10.1* 10.5*  HCT 30.2* 30.9*  MCV 86.0 86.6  PLT 305 280   Basic Metabolic Panel:  Recent Labs Lab 06/21/17 0500 06/21/17 1840 06/22/17 0202 06/23/17 0416 06/24/17 0427 06/25/17 0341  NA 134* 134* 132* 133* 133* 134*  K 3.8 3.8 3.9 4.2 4.0 3.8  CL 102 102 102 100* 100* 102  CO2 20* 22 23 25 26 26   GLUCOSE 103* 109* 96 87 88 95  BUN 14 14 18 17  21* 23*  CREATININE 0.91 0.98 1.01 1.12 1.27* 1.12  CALCIUM 9.3 9.3 9.0 9.4 9.6 9.2  MG 1.8  --  1.5* 2.0 1.8 1.9  PHOS 5.2*  --  5.5* 5.7* 5.6* 5.0*   GFR: Estimated Creatinine Clearance: 85.8 mL/min (by C-G formula based on SCr of 1.12 mg/dL). Liver Function Tests:  Recent Labs Lab 06/19/17 0447  06/21/17 1840 06/22/17 0202 06/23/17 0416 06/24/17 0427 06/25/17 0341  AST 96*  --  117*  --   --   --   --   ALT 92*  --  122*  --   --   --   --   ALKPHOS 169*  --  180*  --   --   --   --   BILITOT 0.5  --  0.4  --   --   --   --   PROT 7.8  --  9.4*  --   --   --   --   ALBUMIN 2.9*  < > 3.2* 3.1* 3.2* 3.3* 3.2*  < > = values in this interval not displayed. No results for input(s): LIPASE, AMYLASE in the last 168 hours. No results for input(s): AMMONIA in the last 168 hours. Coagulation Profile: No results for input(s): INR, PROTIME in the last 168 hours. Cardiac Enzymes: No results for input(s): CKTOTAL, CKMB, CKMBINDEX, TROPONINI in the last 168  hours. BNP (last 3 results) No results for input(s): PROBNP in the last 8760 hours. HbA1C: No results for input(s): HGBA1C in the last 72 hours. CBG:  Recent Labs Lab 06/23/17 1610 06/23/17 2104 06/24/17 0611 06/24/17 1137 06/24/17 1613  GLUCAP 83 88 91 98 73   Lipid Profile: No results for input(s): CHOL, HDL, LDLCALC, TRIG, CHOLHDL, LDLDIRECT in the last 72 hours. Thyroid Function Tests: No results for input(s): TSH, T4TOTAL, FREET4, T3FREE, THYROIDAB in the last 72 hours. Anemia Panel: No results for input(s): VITAMINB12, FOLATE, FERRITIN, TIBC, IRON, RETICCTPCT in the last 72 hours. Urine analysis:    Component Value Date/Time   COLORURINE YELLOW 05/20/2017 1742   APPEARANCEUR  CLEAR 05/20/2017 1742   LABSPEC 1.020 05/20/2017 1742   PHURINE 7.0 05/20/2017 1742   GLUCOSEU NEGATIVE 05/20/2017 1742   HGBUR NEGATIVE 05/20/2017 1742   BILIRUBINUR NEGATIVE 05/20/2017 1742   KETONESUR NEGATIVE 05/20/2017 1742   PROTEINUR NEGATIVE 05/20/2017 1742   UROBILINOGEN 4.0 (H) 09/09/2014 1055   NITRITE NEGATIVE 05/20/2017 1742   LEUKOCYTESUR NEGATIVE 05/20/2017 1742   Sepsis Labs: @LABRCNTIP (procalcitonin:4,lacticidven:4) )No results found for this or any previous visit (from the past 240 hour(s)).       Radiology Studies: No results found.    Scheduled Meds: . azithromycin  1,200 mg Per Tube Weekly  . chlorhexidine  15 mL Mouth Rinse BID  . Chlorhexidine Gluconate Cloth  6 each Topical Daily  . dolutegravir  50 mg Oral Daily  . emtricitabine-rilpivir-tenofovir AF  1 tablet Oral Q breakfast  . feeding supplement (ENSURE ENLIVE)  237 mL Oral TID BM  . feeding supplement (PRO-STAT SUGAR FREE 64)  30 mL Oral TID BM  . fluconazole  400 mg Oral Daily  . levETIRAcetam  1,000 mg Oral BID  . mouth rinse  15 mL Mouth Rinse q12n4p  . QUEtiapine  25 mg Oral BID  . sulfamethoxazole-trimethoprim  10 mL Per Tube Once per day on Mon Wed Fri   Continuous Infusions: . sodium  chloride Stopped (06/05/17 0200)     LOS: 36 days    Time spent in minutes: 35    Calvert CantorSaima Gaylynn Seiple, MD [facility] Pager: [email or web address] Password [email or web address] 06/25/2017, 3:55 PM

## 2017-06-25 NOTE — Progress Notes (Signed)
Occupational Therapy Treatment Patient Details Name: Justin Ball MRN: [id number] DOB: 08-12-1991 Today's Date: 06/25/2017    History of present illness Pt is a 26 year old man admitted s/p seizure activity having several days of recent headaches. 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 EVD was placed after acute decompensation on 5/28 with decorticate posturing and asymmetric pupils. 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 PMH includes: syphilis, substance abuse, soft palate ulceration, scabies, neuromuscular disorder, HIV/AIDS, depression, bipolar disorder, anxiety   OT comments  Pt seen as co-treat with PT. Pt demonstrating sustained attention to task. Able to ambulate in hall with +2 min A (pt's arms around therapist's shoulder). Attempted grooming task at sink. Pt became more restless, began yelling that he was tired and required +2 Max A to safely lower to recliner. Continue to recommend post acute rehab and 24/7 S. Will continue to follow acutely and reassess goals as needed.   Follow Up Recommendations  CIR;Supervision/Assistance - 24 hour    Equipment Recommendations  Other (comment)    Recommendations for Other Services Rehab consult    Precautions / Restrictions Precautions Precautions: Fall       Mobility Bed Mobility Overal bed mobility: Needs Assistance Bed Mobility: Supine to Sit     Supine to sit: Mod assist     General bed mobility comments: Mod A to swing both legs to floor and scoot hips forward  Transfers Overall transfer level: Needs assistance Equipment used: 2 person hand held assist Transfers: Sit to/from Stand Sit to Stand: Min assist;+2 safety/equipment              Balance Overall balance assessment: Needs assistance   Sitting balance-Leahy Scale: Fair       Standing balance-Leahy Scale: Poor                             ADL either performed or  assessed with clinical judgement   ADL Overall ADL's : Needs assistance/impaired     Grooming: Wash/dry hands;Moderate assistance Grooming Details (indicate cue type and reason): washed hands with mod A to atten to task; Attmepted brushing teeth. Pt stopped after beginning to brush teeth and began yelling out that he was tired. Max A to lower to chair safely             Lower Body Dressing: Moderate assistance Lower Body Dressing Details (indicate cue type and reason): Pt abl eo donn socks with feet crossed over knees and socks started over toes                     Vision   Vision Assessment?: Vision impaired- to be further tested in functional context Additional Comments: poor visual attention   Perception     Praxis      Cognition Arousal/Alertness: Awake/alert Behavior During Therapy: Restless;Agitated;Impulsive;Flat affect;Anxious Overall Cognitive Status: Impaired/Different from baseline Area of Impairment: Attention;Following commands;Safety/judgement;Awareness;Problem solving                   Current Attention Level: Sustained Memory: Decreased recall of precautions;Decreased short-term memory Following Commands: Follows one step commands inconsistently Safety/Judgement: Decreased awareness of safety;Decreased awareness of deficits Awareness: Intellectual Problem Solving: Slow processing;Decreased initiation;Difficulty sequencing;Requires tactile cues;Requires verbal cues General Comments: Attempted to sit without chair behind him        Exercises     Shoulder Instructions       General Comments  Pertinent Vitals/ Pain       Pain Assessment: Faces Faces Pain Scale: Hurts little more Pain Location: unsure Pain Descriptors / Indicators: Grimacing Pain Intervention(s): Limited activity within patient's tolerance  Home Living                                          Prior Functioning/Environment               Frequency  Min 3X/week        Progress Toward Goals  OT Goals(current goals can now be found in the care plan section)  Progress towards OT goals: Progressing toward goals  Acute Rehab OT Goals Patient Stated Goal: none stated OT Goal Formulation: Patient unable to participate in goal setting Time For Goal Achievement: 06/29/17 Potential to Achieve Goals: Good ADL Goals Pt Will Perform Eating: with min assist;sitting Pt Will Perform Grooming: with min guard assist;sitting Pt Will Perform Upper Body Bathing: with mod assist;sitting Pt Will Perform Lower Body Bathing: with mod assist;sit to/from stand Pt Will Transfer to Toilet: with min assist;squat pivot transfer;bedside commode Pt/caregiver will Perform Home Exercise Program: Increased ROM;Increased strength;Both right and left upper extremity;With written HEP provided Additional ADL Goal #1: Pt will be oriented x 3 with min cues  Additional ADL Goal #2: Pt will be able to track object for 10 seconds to work on visual attention  Plan Discharge plan remains appropriate    Co-evaluation    PT/OT/SLP Co-Evaluation/Treatment: Yes (partial session) Reason for Co-Treatment: Complexity of the patient's impairments (multi-system involvement);Necessary to address cognition/behavior during functional activity;To address functional/ADL transfers   OT goals addressed during session: ADL's and self-care      AM-PAC PT "6 Clicks" Daily Activity     Outcome Measure   Help from another person eating meals?: A Lot Help from another person taking care of personal grooming?: A Lot Help from another person toileting, which includes using toliet, bedpan, or urinal?: A Lot Help from another person bathing (including washing, rinsing, drying)?: A Lot Help from another person to put on and taking off regular upper body clothing?: A Lot Help from another person to put on and taking off regular lower body clothing?: A Lot 6 Click Score:  12    End of Session Equipment Utilized During Treatment: Gait belt  OT Visit Diagnosis: Unsteadiness on feet (R26.81);Other abnormalities of gait and mobility (R26.89);Muscle weakness (generalized) (M62.81);Feeding difficulties (R63.3);Other symptoms and signs involving the nervous system (R29.898);Other symptoms and signs involving cognitive function;Cognitive communication deficit (R41.841)   Activity Tolerance Patient tolerated treatment well   Patient Left in bed;with call bell/phone within reach;with bed alarm set;Other (comment) (MD with pt)   Nurse Communication Mobility status        Time: [id number] OT Time Calculation (min): 22 min  Charges: OT General Charges $OT Visit: 1 Procedure OT Treatments $Self Care/Home Management : 8-22 mins  [facility] Bobby Barton, OT/L  [phone] 06/25/2017   Maresa Morash,HILLARY 06/25/2017, 3:22 PM

## 2017-06-26 DIAGNOSIS — G40901 Epilepsy, unspecified, not intractable, with status epilepticus: Secondary | ICD-10-CM

## 2017-06-26 DIAGNOSIS — G92 Toxic encephalopathy: Secondary | ICD-10-CM

## 2017-06-26 DIAGNOSIS — R338 Other retention of urine: Secondary | ICD-10-CM

## 2017-06-26 DIAGNOSIS — G0490 Encephalitis and encephalomyelitis, unspecified: Secondary | ICD-10-CM

## 2017-06-26 DIAGNOSIS — B180 Chronic viral hepatitis B with delta-agent: Secondary | ICD-10-CM

## 2017-06-26 DIAGNOSIS — G928 Other toxic encephalopathy: Secondary | ICD-10-CM

## 2017-06-26 DIAGNOSIS — Z982 Presence of cerebrospinal fluid drainage device: Secondary | ICD-10-CM

## 2017-06-26 DIAGNOSIS — B957 Other staphylococcus as the cause of diseases classified elsewhere: Secondary | ICD-10-CM

## 2017-06-26 DIAGNOSIS — R7881 Bacteremia: Secondary | ICD-10-CM

## 2017-06-26 DIAGNOSIS — R7401 Elevation of levels of liver transaminase levels: Secondary | ICD-10-CM

## 2017-06-26 DIAGNOSIS — Z978 Presence of other specified devices: Secondary | ICD-10-CM

## 2017-06-26 DIAGNOSIS — R740 Nonspecific elevation of levels of transaminase and lactic acid dehydrogenase [LDH]: Secondary | ICD-10-CM

## 2017-06-26 DIAGNOSIS — A3700 Whooping cough due to Bordetella pertussis without pneumonia: Secondary | ICD-10-CM

## 2017-06-26 LAB — RENAL FUNCTION PANEL
ALBUMIN: 3.3 g/dL — AB (ref 3.5–5.0)
ANION GAP: 8 (ref 5–15)
BUN: 27 mg/dL — ABNORMAL HIGH (ref 6–20)
CALCIUM: 9.3 mg/dL (ref 8.9–10.3)
CO2: 25 mmol/L (ref 22–32)
Chloride: 101 mmol/L (ref 101–111)
Creatinine, Ser: 1.48 mg/dL — ABNORMAL HIGH (ref 0.61–1.24)
GFR calc non Af Amer: >60 mL/min (ref >60)
Glucose, Bld: 83 mg/dL (ref 65–99)
PHOSPHORUS: 5.5 mg/dL — AB (ref 2.5–4.6)
POTASSIUM: 3.9 mmol/L (ref 3.5–5.1)
SODIUM: 134 mmol/L — AB (ref 135–145)

## 2017-06-26 LAB — MISC LABCORP TEST (SEND OUT): Labcorp test code: 551722

## 2017-06-26 LAB — MAGNESIUM: Magnesium: 1.8 mg/dL (ref 1.7–2.4)

## 2017-06-26 MED ORDER — SULFAMETHOXAZOLE-TRIMETHOPRIM 400-80 MG PO TABS: 1.0000 | ORAL_TABLET | ORAL | Status: DC

## 2017-06-26 MED ORDER — FLUCONAZOLE 200 MG PO TABS: 400.0000 mg | ORAL_TABLET | Freq: Every day | ORAL | Status: DC

## 2017-06-26 MED ORDER — SULFAMETHOXAZOLE-TRIMETHOPRIM 400-80 MG PO TABS: 1.0000 | ORAL_TABLET | ORAL | 1 refills | Status: DC

## 2017-06-26 MED ORDER — ENSURE ENLIVE PO LIQD: 237.0000 mL | Freq: Three times a day (TID) | ORAL | 12 refills | Status: DC

## 2017-06-26 MED ORDER — PRO-STAT SUGAR FREE PO LIQD: 30.0000 mL | Freq: Three times a day (TID) | ORAL | 0 refills | Status: DC

## 2017-06-26 MED ORDER — FLUCONAZOLE 200 MG PO TABS: 400.0000 mg | ORAL_TABLET | Freq: Every day | ORAL | 1 refills | Status: DC

## 2017-06-26 MED ORDER — AZITHROMYCIN 600 MG PO TABS: 1200.0000 mg | ORAL_TABLET | ORAL | 0 refills | Status: DC

## 2017-06-26 MED ORDER — SULFAMETHOXAZOLE-TRIMETHOPRIM 400-80 MG PO TABS
1.0000 | ORAL_TABLET | ORAL | Status: DC
  Filled 2017-06-26: qty 1

## 2017-06-26 MED ORDER — AZITHROMYCIN 600 MG PO TABS: 1200.0000 mg | ORAL_TABLET | ORAL | 1 refills | Status: DC

## 2017-06-26 MED ORDER — SODIUM CHLORIDE 0.9 % IV BOLUS (SEPSIS)
1000.0000 mL | Freq: Once | INTRAVENOUS | Status: AC
  Administered 2017-06-26: 1000 mL via INTRAVENOUS

## 2017-06-26 MED ORDER — LEVETIRACETAM 1000 MG PO TABS: 1000.0000 mg | ORAL_TABLET | Freq: Two times a day (BID) | ORAL | Status: DC

## 2017-06-26 MED ORDER — LEVETIRACETAM 1000 MG PO TABS: 1000.0000 mg | ORAL_TABLET | Freq: Two times a day (BID) | ORAL | 0 refills | Status: DC

## 2017-06-26 MED ORDER — FREE WATER: 400.0000 mL | Freq: Three times a day (TID) | Status: DC

## 2017-06-26 MED ORDER — FREE WATER
400.0000 mL | Freq: Three times a day (TID) | Status: DC
  Administered 2017-06-26 (×2): 400 mL via ORAL
  Administered 2017-06-27: 200 mL via ORAL

## 2017-06-26 MED ORDER — QUETIAPINE FUMARATE 25 MG PO TABS: 25.0000 mg | ORAL_TABLET | Freq: Two times a day (BID) | ORAL | 0 refills | Status: DC | PRN

## 2017-06-26 MED ORDER — QUETIAPINE FUMARATE 25 MG PO TABS: 25.0000 mg | ORAL_TABLET | Freq: Two times a day (BID) | ORAL | Status: DC

## 2017-06-26 MED ORDER — ACETAMINOPHEN 325 MG PO TABS: 650.0000 mg | ORAL_TABLET | ORAL | Status: DC | PRN

## 2017-06-26 MED ORDER — AZITHROMYCIN 600 MG PO TABS: 1200.0000 mg | ORAL_TABLET | ORAL | Status: DC

## 2017-06-26 NOTE — Progress Notes (Signed)
Pt unable to d/c to SNF. Pt walked 110 feet yesterday with PT and SNFs retracted their offers.  CSW spoke to patients family and they are willing to take the patient home. CM called and informed Dr Butler Denmarkizwan and she is in agreement with him discharging home tomorrow.  CM called Dr Jacky KindleAronson and he was in agreement with [facility].  CM called Kandee Keenory with [facility] agency this week) and informed him of the referral.  CM called patients mother and went over the DME needs. CM informed her she would need space for the bed and 24/7 care for the patient. She states his siblings are going to stay with him when she is not home. Also patients grandmother will be able to participate in his care. CM also went over with [facility] mother [facility] and which company would be providing care. She was in agreement. CM [facility]Karen [facility] and informed them of DME needs and that pt would d/c on 7/4.  CM will follow up in the am with any further d/c needs and arranging transportation home as needed.

## 2017-06-26 NOTE — Progress Notes (Signed)
Physical Therapy Treatment Patient Details Name: Justin Ball MRN: [id number] DOB: 09/03/1991 Today's Date: 06/26/2017    History of Present Illness Pt is a 26 year old man admitted s/p seizure activity having several days of recent headaches. 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 EVD was placed after acute decompensation on 5/28 with decorticate posturing and asymmetric pupils. 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 PMH includes: syphilis, substance abuse, soft palate ulceration, scabies, neuromuscular disorder, HIV/AIDS, depression, bipolar disorder, anxiety    PT Comments    Patient seen for OOB activity. Patient assisted with hygiene and pericare x2 during session limiting mobility today. [facility]. Also assisted patient with increased functional task performance. Patient left in chair with SLP for cognitive assessment. Will continue to see and progress activity as tolerated.   Follow Up Recommendations  CIR     Equipment Recommendations  Wheelchair (measurements PT);Wheelchair cushion (measurements PT);Hospital bed    Recommendations for Other Services Rehab consult     Precautions / Restrictions Precautions Precautions: Fall Restrictions Weight Bearing Restrictions: No    Mobility  Bed Mobility Overal bed mobility: Needs Assistance Bed Mobility: Rolling;Supine to Sit Rolling: Mod assist   Supine to sit: Mod assist     General bed mobility comments: Mod assist for all aspects of bed mobility, decreased initiation requiring tactile stimulation to perform. Moderate assist to elevate trunk to upright, patient able to use UE to push to upright and scoot  Transfers Overall transfer level: Needs assistance Equipment used: 2 person hand held assist Transfers: Sit to/from Stand Sit to Stand: Min assist;+2 safety/equipment Stand pivot transfers: Min assist        General transfer comment: Patient with poor ability to set feet in preparation for stance. Min assist of 2 persons to power up and take pivotal steps. Pivot to [facility] and chair x2 during session.  Ambulation/Gait                 Stairs            Wheelchair Mobility    Modified Rankin (Stroke Patients Only) Modified Rankin (Stroke Patients Only) Pre-Morbid Rankin Score: No symptoms Modified Rankin: Severe disability     Balance Overall balance assessment: Needs assistance Sitting-balance support: Feet supported Sitting balance-Leahy Scale: Fair     Standing balance support: Bilateral upper extremity supported Standing balance-Leahy Scale: Poor Standing balance comment: continues to require assist for functional standing task performance                            Cognition Arousal/Alertness: Awake/alert Behavior During Therapy: Restless;Anxious;Impulsive Overall Cognitive Status: Impaired/Different from baseline Area of Impairment: Attention;Following commands;Safety/judgement;Awareness;Problem solving                   Current Attention Level: Sustained Memory: Decreased recall of precautions;Decreased short-term memory Following Commands: Follows one step commands inconsistently Safety/Judgement: Decreased awareness of safety;Decreased awareness of deficits Awareness: Intellectual Problem Solving: Slow processing;Decreased initiation;Difficulty sequencing;Requires tactile cues;Requires verbal cues General Comments: patient able to communicate needs for bowel movement      Exercises Other Exercises Other Exercises: BLE AAROM Other Exercises: sitting balance activities    General Comments        Pertinent Vitals/Pain Pain Assessment: Faces Faces Pain Scale: Hurts even more Pain Location: during bowel movement Pain Descriptors / Indicators: Grimacing;Discomfort;Moaning Pain Intervention(s): Monitored during session    Home Living  Prior Function            PT Goals (current goals can now be found in the care plan section) Acute Rehab PT Goals Patient Stated Goal: none stated PT Goal Formulation: With patient Time For Goal Achievement: 07/04/17 Potential to Achieve Goals: Good Progress towards PT goals: Progressing toward goals    Frequency    Min 3X/week      PT Plan Current plan remains appropriate    Co-evaluation              AM-PAC PT "6 Clicks" Daily Activity  Outcome Measure  Difficulty turning over in bed (including adjusting bedclothes, sheets and blankets)?: Total Difficulty moving from lying on back to sitting on the side of the bed? : Total Difficulty sitting down on and standing up from a chair with arms (e.g., wheelchair, bedside commode, etc,.)?: Total Help needed moving to and from a bed to chair (including a wheelchair)?: A Little Help needed walking in hospital room?: A Lot Help needed climbing 3-5 steps with a railing? : A Lot 6 Click Score: 10    End of Session Equipment Utilized During Treatment: Gait belt Activity Tolerance: Patient tolerated treatment well Patient left: in chair;with call bell/phone within reach;with chair alarm set;Other (comment) (with SLP therapist) Nurse Communication: Mobility status PT Visit Diagnosis: Other symptoms and signs involving the nervous system (R29.898);Muscle weakness (generalized) (M62.81);Difficulty in walking, not elsewhere classified (R26.2)     Time: 1610-9604 PT Time Calculation (min) (ACUTE ONLY): 19 min  Charges:  $Therapeutic Activity: 8-22 mins                    G Codes:       Charlotte Crumb, PT DPT NCS [phone]    Fabio Asa 06/26/2017, 3:43 PM

## 2017-06-26 NOTE — Progress Notes (Signed)
Patient ID: Justin Ball, male   DOB: 11/04/1991, 26 y.o.   MRN: [id number]          [facility]    Date of Admission:  05/20/2017   Total days of cryptococcal therapy 38        Day day 8 fluconazole  He remains very lethargic and poorly responsive. 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 He seems to be tolerating the start of antiretroviral therapy yesterday. He needs to stay on his current dose of fluconazole for at least the next 6 weeks before reducing to maintenance fluconazole therapy. He also needs to stay on antiretroviral therapy with [facility] and Tivicay, and a prophylactic doses of trimethoprim sulfamethoxazole and azithromycin. I will arrange follow-up in our clinic within the next 4 weeks.         Cliffton AstersJohn Sol Odor, MD [facility] [phone] pager   [phone] cell 06/26/2017, 12:11 PM

## 2017-06-26 NOTE — Discharge Instructions (Signed)
He must drink at least 1.5 L of water a day.  Do not allow him to pull on foley catheter You will be receiving a call from [facility] about an appointment for the foley catheter.   Please take all your medications with you for your next visit with your Primary MD. Please request your Primary MD to go over all hospital test results at the follow up. Please ask your Primary MD to get all Hospital records sent to his/her office.  If you experience worsening of your admission symptoms, develop shortness of breath, chest pain, suicidal or homicidal thoughts or a life threatening emergency, you must seek medical attention immediately by calling 911 or calling your MD.  Bonita QuinYou must read the complete instructions/literature along with all the possible adverse reactions/side effects for all the medicines you take including new medications that have been prescribed to you. Take new medicines after you have completely understood and accpet all the possible adverse reactions/side effects.   Do not drive when taking pain medications or sedatives.    Do not take more than prescribed Pain, Sleep and Anxiety Medications  If you have smoked or chewed Tobacco in the last 2 yrs please stop. Stop any regular alcohol and or recreational drug use.  Wear Seat belts while driving.

## 2017-06-26 NOTE — Progress Notes (Signed)
[facility], and ask about LOG placement pending authorization. [facility] indicated no bed available. [facility], and ask about LOG placement pending authorization. [name] discussed that the patient had walked 110 feet in the most recent physical therapy note, therefore [name] would not authorize placement and the facility would not be able to offer a bed.  CSW contacted patient's mother and grandmother to explain that a local SNF placement would not be an option. CSW explained that a SNF placement could be obtained out of town, but patient's mother and grandmother indicated that they would rather take the patient home than place him out of town.   CSW alerted RNCM to discuss home health options. CSW signing off.  Justin NicelyElizabeth Weronika Ball, Kentucky[place]CSW Clinical Social Worker [phone]

## 2017-06-26 NOTE — Evaluation (Addendum)
Speech Language Pathology Evaluation Patient Details Name: Justin Ball MRN: [id number] DOB: 05/22/1991 Today's Date: 06/26/2017 Time: 1432-1500 SLP Time Calculation (min) (ACUTE ONLY): 28 min  Problem List:  Patient Active Problem List   Diagnosis Date Noted  . 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 Acute urinary retention 06/26/2017  . Staphylococcus epidermidis ventriculitis 06/26/2017  . Bacteremia due to Streptococcus pneumoniae 06/26/2017  . Bordetella pertussis 06/26/2017  . 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 Transaminitis   . Endotracheal tube present   . Status epilepticus (HCC)   . History of syphilis   . Tobacco abuse   . Meningoencephalitis   . 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 Acute respiratory failure (HCC)   . Seizure (HCC) 05/20/2017  . Peri-rectal abscess 03/06/2017  . 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 Anemia 09/12/2016  . 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 Bipolar disorder (HCC) 04/08/2013  . Rash and nonspecific skin eruption 10/10/2012   Past Medical History:  Past Medical History:  Diagnosis Date  . Anxiety   . Bipolar 1 disorder (HCC)   . Depression   . HIV infection (HCC)   . Neuromuscular disorder (HCC)   . Scabies   . 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 Substance abuse   . Syphilis    Past Surgical History:  Past Surgical History:  Procedure Laterality Date  . EXAMINATION UNDER ANESTHESIA N/A 12/28/2014   Procedure: EXAM UNDER ANESTHESIA;  Surgeon: Karie SodaSteven Gross, MD;  Location: [facility];  Service: General;  Laterality: N/A;  . FLOOR OF MOUTH BIOPSY N/A 09/16/2016   Procedure: BIOPSY OF ORAL ABCESS;  Surgeon: Newman PiesSu Teoh, MD;  Location: [facility] OR;  Service: ENT;  Laterality: N/A;  . INCISION AND DRAINAGE ABSCESS N/A 09/16/2016   Procedure: INCISION AND DRAINAGE ABSCESS;  Surgeon: Newman PiesSu Teoh, MD;  Location: [facility] OR;  Service: ENT;  Laterality: N/A;   . INCISION AND DRAINAGE PERIRECTAL ABSCESS  08/27/2010   Dr Carolynne Edouardoth  . INCISION AND DRAINAGE PERIRECTAL ABSCESS N/A 12/28/2014   Procedure: IRRIGATION AND DEBRIDEMENT PERIRECTAL ABSCESS;  Surgeon: Karie SodaSteven Gross, MD;  Location: [facility];  Service: General;  Laterality: N/A;  Fistula repair and ablation  . LAPAROSCOPIC REVISION VENTRICULAR-PERITONEAL (V-P) SHUNT N/A 06/12/2017   Procedure: LAPAROSCOPIC REVISION VENTRICULAR-PERITONEAL (V-P) SHUNT;  Surgeon: Kinsinger, De BlanchLuke Aaron, MD;  Location: [facility];  Service: General;  Laterality: N/A;  . VENTRICULOPERITONEAL SHUNT N/A 06/12/2017   Procedure: SHUNT INSERTION VENTRICULAR-PERITONEAL;  Surgeon: Lisbeth RenshawNundkumar, Neelesh, MD;  Location: [facility];  Service: Neurosurgery;  Laterality: N/A;   HPI:  Pt is a 26 year old man admitted s/p seizure activity having several days of recent headaches. 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 EVD was placed after acute decompensation on 5/28 with decorticate posturing and asymmetric pupils. 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 PMH includes: syphilis, substance abuse, soft palate ulceration, scabies, neuromuscular disorder, HIV/AIDS, depression, bipolar disorder, anxiety   Assessment / Plan / Recommendation Clinical Impression  Pt given parts of the Cognistat. Exhibits moderate-severe dysarthria and is stimulable for increase intensity and over-articulate. Decreased sustained attention, awareness and delayed information processing and responses and decreased verbal problem solving. ST will facilitate functional speech intelligibility and cognitive ability. Possible discharge home tomorrow per social work note. Pt need home health speech therapy.    SLP Assessment  SLP Recommendation/Assessment: Patient needs continued Speech  [place] [facility] SLP Visit Diagnosis: Cognitive communication deficit (R41.841)    Follow Up Recommendations  24 hour  supervision/assistance    Frequency and Duration min 2x/week  2 weeks      SLP Evaluation Cognition  Overall Cognitive Status: Impaired/Different from baseline Arousal/Alertness: Awake/alert Orientation Level: Oriented to person;Oriented to place;Disoriented to time;Disoriented to situation Attention: Sustained Sustained Attention: Impaired Sustained Attention Impairment: Verbal basic;Functional basic Memory: Impaired Memory Impairment: Storage deficit;Retrieval deficit Awareness: Impaired Awareness Impairment: Intellectual impairment;Emergent impairment;Anticipatory impairment Problem Solving: Impaired Problem Solving Impairment: Verbal basic Executive Function: Self Correcting;Self Monitoring Safety/Judgment: Impaired       Comprehension  Auditory Comprehension Overall Auditory Comprehension:  (functional for basic) Commands: Impaired Two Step Basic Commands: 50-74% accurate Multistep Basic Commands: 25-49% accurate Interfering Components: Attention;Processing speed;Working Radio broadcast assistant: Barrister's clerk: Not tested Reading Comprehension Reading Status:  (TBA)    Expression Expression Primary Mode of Expression: Verbal Verbal Expression Overall Verbal Expression: Appears within functional limits for tasks assessed Initiation: No impairment Level of Generative/Spontaneous Verbalization: Sentence Repetition:  (NT) Naming: No impairment Pragmatics: Impairment Impairments: Abnormal affect Interfering Components: Attention Written Expression Dominant Hand: Right Written Expression:  (wrote name- will assess further)   Oral / Motor  Oral Motor/Sensory Function Overall Oral Motor/Sensory Function: Generalized oral weakness Motor Speech Overall Motor Speech: Impaired Respiration: Within functional limits Phonation: Low vocal intensity Resonance: Within functional limits Articulation:  Impaired Level of Impairment: Phrase Intelligibility: Intelligibility reduced Word: 50-74% accurate Phrase: 25-49% accurate Motor Planning: Impaired Level of Impairment: Phrase Motor Speech Errors: Unaware Effective Techniques: Over-articulate;Increased vocal intensity;Pause   GO                    Royce Macadamia 06/26/2017, 3:34 PM  Breck Coons Lonell Face.Ed [employer] [phone]

## 2017-06-26 NOTE — Care Management Note (Deleted)
Case Management Note  Patient Details  Name: Justin Ball MRN: [id number] Date of Birth: 02/06/1991  Subjective/Objective:                    Action/Plan:   Expected Discharge Date:  06/26/17               Expected Discharge Plan:  Skilled Nursing Facility  In-House Referral:  Chaplain  Discharge planning Services  CM Consult  Post Acute Care Choice:  Home Health, Durable Medical Equipment Choice offered to:  Parent  DME Arranged:  3-N-1, Hospital bed, Wheelchair manual DME Agency:  [facility]  HH Arranged:  RN, PT, OT, Nurse's Aide, Social Work HH [facility]  Status of Service:  In process, will continue to follow  If discussed at Long Length of Stay Meetings, dates discussed:    Additional Comments:  Kermit BaloKelli F Mical Kicklighter, RN 06/26/2017, 3:01 PM

## 2017-06-26 NOTE — Progress Notes (Signed)
Staples were removed today from surgical sites Patient tolerated well. No complications.

## 2017-06-26 NOTE — Discharge Summary (Addendum)
Physician Discharge Summary  Justin Ball ZOX:[id number] DOB: 06-02-1991 DOA: 05/20/2017  PCP: Randall Hiss, MD  Admit date: 05/20/2017 Discharge date: 06/26/2017  Admitted From: home Disposition:  Home with home health  This is a preliminary d/c summary which will be updated on day of discharge.  Recommendations for Outpatient Follow-up:  1. Needs Urology f/u for Urinary retention- spoke with Urology today about obtaining an early appt - high risk of pulling on this and causing urethral injury  2. ARV resumed- follow for Immune reconstitution inflammatory syndrome 3. Avoid PPI, H2 blockers, Mg+, Ca+ supplements with current ART  4. Bmet and CBC in 3 day  - following renal function & neutropenia 5. Avoid NSAIDs 6. 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 Does not respond to more detailed questions.  Brief Summary:  26 y/o with AIDS, Syphilis, Chronic Hep B, substance abuse, cigarette smoker, Bipolar disorder and poor compliance with treatment presented to ER on 5/27 intially unresponsive and then noted to be having seizures int he ER. He had been seen in the ER 3 day prior for a headache, underwent a CT of  the head which was unrevealing and was discharged from the ER. Apparently he was also having fevers at home. He was intubated in the ER and has been diagnosed with Cryptococcal and stap epidermidis meningitis which was complicated by non-obstructive hydrocephalus with impending uncal herniation.  Seizures being managed with Keppra.  Underwent Right frontal Ventriculostomy (5/28) and VP shunt (6/19) by Dr Rolanda Jay. Medically treated with Flucytosine which was switched to Amphotericin due to leukopenia. Amphotericin transitioned to Fluconazole on 6/26.  Extubated on 05/26/17 but continued to have NG tube which was eventually removed. He is eating solids.   Care was transferred from ICU team to [facility] on 6/9.  Precedex stopped on 6/8- was oversedated and all other sedatives/ pain medications weaned off to allow him to become more alert. Further complications included Strep pneumo bacteremia, Bordatella respiratory infection, ongoing metabolic encephalopathy & urinary retention.   His altered mental status has continued to be an ongoing issue and he may have irreversible cognitive deficits. He also has a bladder injury and requires a foley.   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 Csf cr Ag 1:2560+. - S/p emergent ventriculostomy 5/28 and VP shunt on 6/19 to prevent uncal herniation- Dr Lottie Rater - Keppra per neurology for seizure prophylaxis  - cognitive status has improved but is not at baseline yet - Continue treatment per ID-  now on Fluconazole 400 mg daily - I spoke in detail with Dr Orvan Falconer- ID will wean as outpt - staples have been removed by NS today as it has been 2 wks   AIDS - was last seen in ID clinic in 4/18- started on new regimen - CD4 count 2 on 4/25 -  HIV 1 RNA  83,400 on 5/17 - ART initially held and has just been resumed 7/2  by ID >> T[place]ay, Odef[place]- will need to avoid PPI, H2 blockers, Mg+, Ca+ supplements - Continue bactrim 3 x /wk and weekly azithromycin ppx  Acute toxic encephalopathy/ toxic encephalitis - meningoencephalitis related to Cryptococcus, seizures, ischemic infarcts (no antiplatelet per neuro) - 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 - cognitive status still an issue- BID Seroquel has been helping with impulsiveness and restlessness- he is now aware that he is in the hospital but not oriented to situation  Strep pneumoniae bacteremia - treated with Ceftriaxone  Urinary retention - voiding trial 6/25 but had to be recatheterized 6/26 - failed  A second voiding trial as well and foley replaced - I have spoken with Urology, Dr Wilson Singer who suspects that he may have a bladder stretch injury - recommending to continue foley until seen in the office-  appt given for next month- I have spoken with Dr Sherron Monday who is working on arranging an earlier appt. -    Acute renal insufficiency - renal function fluctuating based on oral intake - needs to be encouraged to drink fluids- he will not do this on his own  Dysphagia secondary to acute metabolic encephalopathy - resolved and NG tube removed  -oral intake varies day to day based on mood and mental status but he no longer has dysphagia   Chronic Hep B - high viral load but LFTs normal - management per ID  Neutropenia  - Suspected due to flucytosine given early on in the admission.  -  cont to follow  Diarrhea:  - Acute, likely from tube feeds and has resolved - Flexiseal d/c 6/23  Malnutrition- severe - cont to follow nutrition- intake is erratic- giving nutritional supplements and assisting with feeds - Prealbumin 6/26 is reasonable at 24  Severe debility - unable to walk and will need significant amount of PT at [facility]   Discharge Instructions  Discharge  Instructions    Diet general    Complete by:  As directed    Regular diet   Increase activity slowly    Complete by:  As directed      Allergies as of 06/26/2017      Reactions   No Known Allergies       Medication List    STOP taking these medications   naproxen sodium 220 MG tablet Commonly known as:  ANAPROX     TAKE these medications   acetaminophen 325 MG tablet Commonly known as:  TYLENOL Take 2 tablets (650 mg total) by mouth every 4 (four) hours as needed for mild pain (temp > 101.5).   azithromycin 600 MG tablet Commonly known as:  ZITHROMAX Take 2 tablets (1,200 mg total) by mouth once a week. Start taking on:  06/28/2017   dolutegravir 50 MG tablet Commonly known as:  TIVICAY Take 1 tablet (50 mg total) by mouth daily.   emtricitabine-rilpivir-tenofovir AF 200-25-25 MG Tabs tablet Commonly known as:  ODEFSEY Take 1 tablet by mouth daily with breakfast.   feeding supplement (ENSURE ENLIVE) Liqd Take 237 mLs by mouth 3 (three) times daily  between meals.   feeding supplement (PRO-STAT SUGAR FREE 64) Liqd Take 30 mLs by mouth 3 (three) times daily between meals.   fluconazole 200 MG tablet Commonly known as:  DIFLUCAN Take 2 tablets (400 mg total) by mouth daily.   free water Soln Take 400 mLs by mouth every 8 (eight) hours.   levETIRAcetam 1000 MG tablet Commonly known as:  KEPPRA Take 1 tablet (1,000 mg total) by mouth 2 (two) times daily.   QUEtiapine 25 MG tablet Commonly known as:  SEROQUEL Take 1 tablet (25 mg total) by mouth 2 (two) times daily as needed (agitation).   sulfamethoxazole-trimethoprim 400-80 MG tablet Commonly known as:  BACTRIM,SEPTRA Take 1 tablet by mouth 3 (three) times a week. Start taking on:  06/27/2017 What changed:  when to take this            Durable Medical Equipment        Start     Ordered   06/26/17 1244  For home use only DME standard manual wheelchair with seat cushion  Once    Comments:  Patient  suffers from severe deconditioning which impairs their ability to perform daily activities like dressing in the home.  A walker will not resolve  issue with performing activities of daily living. A wheelchair will allow patient to safely perform daily activities. Patient can safely propel the wheelchair in the home or has a caregiver who can provide assistance.  Accessories: elevating leg rests (ELRs), wheel locks, extensions and anti-tippers.   06/26/17 1244   06/26/17 1243  For home use only DME Hospital bed  Once    Question Answer Comment  Patient has (list medical condition): cryptococcal meningitis, encephalitis, AIDS   The above medical condition requires: Patient requires the ability to reposition frequently   Head must be elevated greater than: 30 degrees   Bed type Semi-electric   Support Surface: Gel Overlay      06/26/17 1244     Follow-up Information    [facility] Follow up on 08/09/2017.   Why:  patient has foley and has urinary retention/appt.per Lovenia Kim Triage RN The office will be calling with an earlier appointment. Contact information: [address] [name] [address] [phone]        Follow up Follow up in 1 week(s).   Why:  has foley with urinary retention       [name], Lisette Grinder, MD Follow up.   Specialty:  Infectious Diseases Why:  The office will call you for an appointment Contact information: [address] [name] [address] [phone]          Allergies  Allergen Reactions  . No Known Allergies      Procedures/Studies:  Ventriculostomy and V-P shunt  Intubation/ extubation  NG tube  LP  Video EEG  2 D ECHO Left ventricle: The cavity size was normal. Wall thickness was   normal. Systolic function was vigorous. The estimated ejection   fraction was in the range of 65% to 70%. Left ventricular   diastolic function parameters were normal. - Atrial septum: No defect or  patent foramen ovale was identified.  Ct Head Wo Contrast  Result Date: 06/04/2017 CLINICAL DATA:  HIV with cryptococcal meningoencephalitis. Ventriculostomy tube removal with fluid leak. EXAM: CT HEAD WITHOUT CONTRAST TECHNIQUE: Contiguous axial images were obtained from the base of the skull through the vertex without intravenous contrast. COMPARISON:  MRI 05/25/2017 FINDINGS: Brain: Interval removal  of right ventricular catheter. Ventricle size remains normal. No hemorrhage or fluid collection. MRI findings of cortical edema enhancement with restricted diffusion are best seen on prior MRI. No large vessel infarct is identified on CT. No acute hemorrhage. 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 Vascular: Negative for hyperdense vessel Skull: Negative Sinuses/Orbits: Mucosal edema in the paranasal sinuses. Other: None IMPRESSION: Interval removal of right frontal ventricular catheter. No hydrocephalus or fluid collection identified. No acute hemorrhage Cortical restricted diffusion and left meningeal enhance on MRI are best seen on the prior MRI. No acute infarct on today's CT. Electronically Signed   By: Marlan Palau M.D.   On: 06/04/2017 14:25   Dg Chest Port 1 View  Result Date: 06/13/2017 CLINICAL DATA:  History of HIV, cryptococcal meningitis, current smoker. EXAM: PORTABLE CHEST 1 VIEW COMPARISON:  Chest x-ray of June 06, 2017 FINDINGS: The lungs are adequately inflated and clear. The heart and pulmonary vascularity are normal. The feeding tube tip projects below the inferior margin of the image. The right sided ventriculoperitoneal shunt tube appears stable. The left-sided PICC line tip projects over the right cavoatrial junction. Three surgical skin staples are present just above the medial right clavicle. IMPRESSION: There is no active cardiopulmonary disease. The support devices are in reasonable position where visualized. Electronically Signed   By: David  [place] M.D.   On:  06/13/2017 09:01   Dg Chest Port 1 View  Result Date: 06/06/2017 CLINICAL DATA:  PICC placement EXAM: PORTABLE CHEST 1 VIEW COMPARISON:  05/27/2017 FINDINGS: Left arm PICC tip is at the SVC RA junction. Soft feeding tube enters the abdomen. The lungs are clear. No effusions. IMPRESSION: Left arm PICC tip at the SVC RA junction.  Lungs clear. Electronically Signed   By: Paulina Fusi M.D.   On: 06/06/2017 13:21       Discharge Exam: Vitals:   06/26/17 0818 06/26/17 0943  BP: 106/69 102/62  Pulse: 86 84  Resp: 16 18  Temp: 97.5 F (36.4 C) 98.4 F (36.9 C)   Vitals:   06/26/17 0143 06/26/17 0601 06/26/17 0818 06/26/17 0943  BP: 110/67 102/71 106/69 102/62  Pulse: 92 90 86 84  Resp: 16 18 16 18   Temp: 98.6 F (37 C) 98 F (36.7 C) 97.5 F (36.4 C) 98.4 F (36.9 C)  TempSrc: Oral Oral Axillary Oral  SpO2: 93% 94% 95% 99%  Weight:      Height:        General: cachectic male laying in bed- Pt is alert, awake, oriented to person and place but not situation- not in acute distress HEENT: incisions clean- staples removed today Cardiovascular: RRR, S1/S2 +, no rubs, no gallops Respiratory: CTA bilaterally, no wheezing, no rhonchi Abdominal: Soft, NT, ND, bowel sounds + Extremities: no edema, no cyanosis    The results of significant diagnostics from this hospitalization (including imaging, microbiology, ancillary and laboratory) are listed below for reference.     Microbiology: No results found for this or any previous visit (from the past 240 hour(s)).   Labs: BNP (last 3 results) No results for input(s): BNP in the last 8760 hours. Basic Metabolic Panel:  Recent Labs Lab 06/22/17 0202 06/23/17 0416 06/24/17 0427 06/25/17 0341 06/26/17 0535  NA 132* 133* 133* 134* 134*  K 3.9 4.2 4.0 3.8 3.9  CL 102 100* 100* 102 101  CO2 23 25 26 26 25   GLUCOSE 96 87 88 95 83  BUN 18 17 21* 23*  27*  CREATININE 1.01 1.12 1.27* 1.12 1.48*  CALCIUM 9.0 9.4 9.6 9.2 9.3  MG 1.5*  2.0 1.8 1.9 1.8  PHOS 5.5* 5.7* 5.6* 5.0* 5.5*   Liver Function Tests:  Recent Labs Lab 06/21/17 1840 06/22/17 0202 06/23/17 0416 06/24/17 0427 06/25/17 0341 06/26/17 0535  AST 117*  --   --   --   --   --   ALT 122*  --   --   --   --   --   ALKPHOS 180*  --   --   --   --   --   BILITOT 0.4  --   --   --   --   --   PROT 9.4*  --   --   --   --   --   ALBUMIN 3.2* 3.1* 3.2* 3.3* 3.2* 3.3*   No results for input(s): LIPASE, AMYLASE in the last 168 hours. No results for input(s): AMMONIA in the last 168 hours. CBC:  Recent Labs Lab 06/22/17 0202  WBC 2.7*  HGB 10.5*  HCT 30.9*  MCV 86.6  PLT 280   Cardiac Enzymes: No results for input(s): CKTOTAL, CKMB, CKMBINDEX, TROPONINI in the last 168 hours. BNP: Invalid input(s): POCBNP CBG:  Recent Labs Lab 06/23/17 1610 06/23/17 2104 06/24/17 0611 06/24/17 1137 06/24/17 1613  GLUCAP 83 88 91 98 73   D-Dimer No results for input(s): DDIMER in the last 72 hours. Hgb A1c No results for input(s): HGBA1C in the last 72 hours. Lipid Profile No results for input(s): CHOL, HDL, LDLCALC, TRIG, CHOLHDL, LDLDIRECT in the last 72 hours. Thyroid function studies No results for input(s): TSH, T4TOTAL, T3FREE, THYROIDAB in the last 72 hours.  Invalid input(s): FREET3 Anemia work up No results for input(s): VITAMINB12, FOLATE, FERRITIN, TIBC, IRON, RETICCTPCT in the last 72 hours. Urinalysis    Component Value Date/Time   COLORURINE YELLOW 05/20/2017 1742   APPEARANCEUR CLEAR 05/20/2017 1742   LABSPEC 1.020 05/20/2017 1742   PHURINE 7.0 05/20/2017 1742   GLUCOSEU NEGATIVE 05/20/2017 1742   HGBUR NEGATIVE 05/20/2017 1742   BILIRUBINUR NEGATIVE 05/20/2017 1742   KETONESUR NEGATIVE 05/20/2017 1742   PROTEINUR NEGATIVE 05/20/2017 1742   UROBILINOGEN 4.0 (H) 09/09/2014 1055   NITRITE NEGATIVE 05/20/2017 1742   LEUKOCYTESUR NEGATIVE 05/20/2017 1742   Sepsis Labs Invalid input(s): PROCALCITONIN,  WBC,   LACTICIDVEN Microbiology No results found for this or any previous visit (from the past 240 hour(s)).   Time coordinating discharge: Over 30 minutes  SIGNED:   Calvert CantorSaima Lynsi Dooner, MD  [facility] 06/26/2017, 12:58 PM Pager   If 7PM-7AM, please contact night-coverage [email or web address] Password [email or web address]

## 2017-06-27 LAB — RENAL FUNCTION PANEL
ALBUMIN: 3.1 g/dL — AB (ref 3.5–5.0)
ANION GAP: 10 (ref 5–15)
BUN: 28 mg/dL — ABNORMAL HIGH (ref 6–20)
CHLORIDE: 101 mmol/L (ref 101–111)
CO2: 22 mmol/L (ref 22–32)
Calcium: 9.1 mg/dL (ref 8.9–10.3)
Creatinine, Ser: 1.36 mg/dL — ABNORMAL HIGH (ref 0.61–1.24)
GFR calc Af Amer: >60 mL/min (ref >60)
GLUCOSE: 87 mg/dL (ref 65–99)
PHOSPHORUS: 4.8 mg/dL — AB (ref 2.5–4.6)
POTASSIUM: 3.7 mmol/L (ref 3.5–5.1)
Sodium: 133 mmol/L — ABNORMAL LOW (ref 135–145)

## 2017-06-27 LAB — CBC
HCT: 31.8 % — ABNORMAL LOW (ref 39.0–52.0)
Hemoglobin: 10.7 g/dL — ABNORMAL LOW (ref 13.0–17.0)
MCH: 29.1 pg (ref 26.0–34.0)
MCHC: 33.6 g/dL (ref 30.0–36.0)
MCV: 86.4 fL (ref 78.0–100.0)
PLATELETS: 215 10*3/uL (ref 150–400)
RBC: 3.68 MIL/uL — ABNORMAL LOW (ref 4.22–5.81)
RDW: 13.1 % (ref 11.5–15.5)
WBC: 3 10*3/uL — AB (ref 4.0–10.5)

## 2017-06-27 NOTE — Discharge Summary (Signed)
Physician Discharge Summary  Justin SpurrLinston R Sanson ZOX:[id number] DOB: 04/19/1991 DOA: 05/20/2017  PCP: Randall HissVan Dam, [name], MD  Admit date: 05/20/2017 Discharge date: 06/27/2017  Admitted From: home Disposition:  Home with home health    Recommendations for Outpatient Follow-up:  1. Needs Urology f/u for Urinary retention- spoke with Urology today about obtaining an early appt - high risk of pulling on this and causing urethral injury  2. ARV resumed- follow for Immune reconstitution inflammatory syndrome 3. Avoid PPI, H2 blockers, Mg+, Ca+ supplements with current ART  4. Bmet and CBC in 3 day  - following renal function & neutropenia 5. Avoid NSAIDs 6. 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 He had been seen in the ER 3 day prior for a headache, underwent a CT of the head which was unrevealing and was discharged from the ER. Apparently he was also having fevers at home. He was intubated in the ER and has been diagnosed with Cryptococcal  and stap epidermidis meningitis which was complicated by non-obstructive hydrocephalus with impending uncal herniation.  Seizures being managed with Keppra.  Underwent Right frontal Ventriculostomy (5/28) and VP shunt (6/19) by Dr [name]. Medically treated with Flucytosine which was switched to Amphotericin due to leukopenia. Amphotericin transitioned to Fluconazole on 6/26.  Extubated on 05/26/17 but continued to have NG tube which was eventually removed. He is eating solids.   Care was transferred from ICU team to [facility] on 6/9.  Precedex stopped on 6/8- was oversedated and all other sedatives/ pain medications weaned off to allow him to become more alert. Further complications included Strep pneumo bacteremia, Bordatella respiratory infection, ongoing metabolic encephalopathy & urinary retention.   His altered mental status has continued to be an ongoing issue and he may have irreversible cognitive deficits. He also has a bladder injury and requires a foley.   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 Csf cr Ag 1:2560+. - S/p emergent ventriculostomy 5/28 and VP shunt on 6/19 to prevent uncal herniation- Dr Lottie RaterNumdkumar - Keppra per neurology for seizure prophylaxis  - cognitive status has improved but is not at baseline yet - Continue treatment per ID- now on Fluconazole 400 mg daily - He needs to stay on his current dose of fluconazole for at least the next 6 weeks before reducing  to maintenance fluconazole therapy - staples have been removed by NS today as it has been 2 wks   AIDS - was last seen in ID clinic in 4/18- started on new regimen - CD4 count 2 on 4/25 -  HIV 1 RNA 83,400 on 5/17 - ART initially held and has just been resumed 7/2  by ID  >> Tivicay, Odefsey- will need to avoid PPI, H2 blockers, Mg+, Ca+ supplements - Continue bactrim 3 x /wk and weekly azithromycin as prophylactic doses   Acute toxic encephalopathy/ toxic encephalitis - meningoencephalitis related to Cryptococcus, seizures, ischemic infarcts (no antiplatelet per neuro) - 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 - cognitive status still an issue- BID Seroquel has been helping with impulsiveness and restlessness- he is now aware that he is in the hospital but not oriented to situation  Strep pneumoniae bacteremia - treated with Ceftriaxone  Urinary retention - voiding trial 6/25 but had to be recatheterized 6/26 - failed  A second voiding trial as well and foley replaced - I have spoken with Urology, Dr Wilson Singer who suspects that he may have a bladder stretch injury - recommending to continue foley until seen in the office-  appt given for next month- I have spoken with Dr Sherron Monday who is working on arranging an earlier appt. -    Acute renal insufficiency - renal function fluctuating based on oral intake - needs to be encouraged to drink fluids- he will not do this on his own  Dysphagia secondary to acute metabolic encephalopathy - resolved and NG tube removed  -oral intake varies day to day based on mood and mental status but he no longer has dysphagia   Chronic Hep B - high viral load but LFTs normal - management per ID  Neutropenia  - Suspected due to flucytosine given early on in the admission.  -  cont to follow  Diarrhea:  - Acute, likely from tube feeds and has resolved - Flexiseal d/c 6/23  Malnutrition- severe - cont to follow nutrition- intake is erratic- giving nutritional supplements and assisting with feeds - Prealbumin 6/26 is reasonable at 24  Severe debility - unable to walk and will need significant amount of PT at [facility]   Discharge Instructions  Discharge Instructions    Diet general    Complete by:  As  directed    Regular diet   Increase activity slowly    Complete by:  As directed      Allergies as of 06/27/2017      Reactions   No Known Allergies       Medication List    STOP taking these medications   naproxen sodium 220 MG tablet Commonly known as:  ANAPROX     TAKE these medications   acetaminophen 325 MG tablet Commonly known as:  TYLENOL Take 2 tablets (650 mg total) by mouth every 4 (four) hours as needed for mild pain (temp > 101.5).   azithromycin 600 MG tablet Commonly known as:  ZITHROMAX Take 2 tablets (1,200 mg total) by mouth once a week. Start taking on:  06/28/2017   dolutegravir 50 MG tablet Commonly known as:  TIVICAY Take 1 tablet (50 mg total) by mouth daily.   emtricitabine-rilpivir-tenofovir AF 200-25-25 MG Tabs tablet Commonly known as:  ODEFSEY Take 1 tablet by mouth daily with breakfast.   feeding supplement (ENSURE ENLIVE) Liqd Take 237 mLs by mouth 3 (three) times daily between meals.   feeding supplement (PRO-STAT SUGAR FREE 64) Liqd Take  30 mLs by mouth 3 (three) times daily between meals.   fluconazole 200 MG tablet Commonly known as:  DIFLUCAN Take 2 tablets (400 mg total) by mouth daily.   free water Soln Take 400 mLs by mouth every 8 (eight) hours.   levETIRAcetam 1000 MG tablet Commonly known as:  KEPPRA Take 1 tablet (1,000 mg total) by mouth 2 (two) times daily.   QUEtiapine 25 MG tablet Commonly known as:  SEROQUEL Take 1 tablet (25 mg total) by mouth 2 (two) times daily as needed (agitation).   sulfamethoxazole-trimethoprim 400-80 MG tablet Commonly known as:  BACTRIM,SEPTRA Take 1 tablet by mouth 3 (three) times a week. What changed:  when to take this            Durable Medical Equipment        Start     Ordered   06/26/17 1500  For home use only DME 3 n 1  Once     06/26/17 1459   06/26/17 1244  For home use only DME standard manual wheelchair with seat cushion  Once    Comments:  Patient suffers from  severe deconditioning which impairs their ability to perform daily activities like dressing in the home.  A walker will not resolve  issue with performing activities of daily living. A wheelchair will allow patient to safely perform daily activities. Patient can safely propel the wheelchair in the home or has a caregiver who can provide assistance.  Accessories: elevating leg rests (ELRs), wheel locks, extensions and anti-tippers.   06/26/17 1244   06/26/17 1243  For home use only DME Hospital bed  Once    Question Answer Comment  Patient has (list medical condition): cryptococcal meningitis, encephalitis, AIDS   The above medical condition requires: Patient requires the ability to reposition frequently   Head must be elevated greater than: 30 degrees   Bed type Semi-electric   Support Surface: Gel Overlay      06/26/17 1244     Follow-up Information    [facility] Follow up on 08/09/2017.   Why:  patient has foley and has urinary retention/appt.per Lovenia Kim Triage RN The office will be calling with an earlier appointment. Contact information: [address] [name] [address] [phone]        Follow up Follow up in 1 week(s).   Why:  has foley with urinary retention       [name], Lisette Grinder, MD Follow up.   Specialty:  Infectious Diseases Why:  The office will call you for an appointment Contact information: [address] [name] [address] [phone]          Allergies  Allergen Reactions  . No Known Allergies      Procedures/Studies:  Ventriculostomy and V-P shunt  Intubation/ extubation  NG tube  LP  Video EEG  2 D ECHO Left ventricle: The cavity size was normal. Wall thickness was   normal. Systolic function was vigorous. The estimated ejection   fraction was in the range of 65% to 70%. Left ventricular   diastolic function parameters were normal. - Atrial septum: No defect or patent foramen  ovale was identified.  Ct Head Wo Contrast  Result Date: 06/04/2017 CLINICAL DATA:  HIV with cryptococcal meningoencephalitis. Ventriculostomy tube removal with fluid leak. EXAM: CT HEAD WITHOUT CONTRAST TECHNIQUE: Contiguous axial images were obtained from the base of the skull through the vertex without intravenous contrast. COMPARISON:  MRI 05/25/2017  FINDINGS: Brain: Interval removal of right ventricular catheter. Ventricle size remains normal. No hemorrhage or fluid collection. MRI findings of cortical edema enhancement with restricted diffusion are best seen on prior MRI. No large vessel infarct is identified on CT. No acute hemorrhage. 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 Vascular: Negative for hyperdense vessel Skull: Negative Sinuses/Orbits: Mucosal edema in the paranasal sinuses. Other: None IMPRESSION: Interval removal of right frontal ventricular catheter. No hydrocephalus or fluid collection identified. No acute hemorrhage Cortical restricted diffusion and left meningeal enhance on MRI are best seen on the prior MRI. No acute infarct on today's CT. Electronically Signed   By: Marlan Palau M.D.   On: 06/04/2017 14:25   Dg Chest Port 1 View  Result Date: 06/13/2017 CLINICAL DATA:  History of HIV, cryptococcal meningitis, current smoker. EXAM: PORTABLE CHEST 1 VIEW COMPARISON:  Chest x-ray of June 06, 2017 FINDINGS: The lungs are adequately inflated and clear. The heart and pulmonary vascularity are normal. The feeding tube tip projects below the inferior margin of the image. The right sided ventriculoperitoneal shunt tube appears stable. The left-sided PICC line tip projects over the right cavoatrial junction. Three surgical skin staples are present just above the medial right clavicle. IMPRESSION: There is no active cardiopulmonary disease. The support devices are in reasonable position where visualized. Electronically Signed   By: David  [place] M.D.   On: 06/13/2017 09:01    Dg Chest Port 1 View  Result Date: 06/06/2017 CLINICAL DATA:  PICC placement EXAM: PORTABLE CHEST 1 VIEW COMPARISON:  05/27/2017 FINDINGS: Left arm PICC tip is at the SVC RA junction. Soft feeding tube enters the abdomen. The lungs are clear. No effusions. IMPRESSION: Left arm PICC tip at the SVC RA junction.  Lungs clear. Electronically Signed   By: Paulina Fusi M.D.   On: 06/06/2017 13:21       Discharge Exam: Vitals:   06/27/17 0051 06/27/17 0423  BP: 108/70 (!) 90/58  Pulse: 97 88  Resp: 18 18  Temp: 98.2 F (36.8 C) 98.5 F (36.9 C)   Vitals:   06/26/17 2118 06/27/17 0051 06/27/17 0423 06/27/17 0500  BP: 114/71 108/70 (!) 90/58   Pulse: 87 97 88   Resp: 18 18 18    Temp: 98.2 F (36.8 C) 98.2 F (36.8 C) 98.5 F (36.9 C)   TempSrc: Oral Oral Oral   SpO2: 99% 100% 100%   Weight:    61.2 kg (135 lb)  Height:        General: cachectic male laying in bed- Pt is alert, awake, oriented to person and place but not situation- not in acute distress HEENT: incisions clean- staples removed today Cardiovascular: RRR, S1/S2 +, no rubs, no gallops Respiratory: CTA bilaterally, no wheezing, no rhonchi Abdominal: Soft, NT, ND, bowel sounds + Extremities: no edema, no cyanosis    The results of significant diagnostics from this hospitalization (including imaging, microbiology, ancillary and laboratory) are listed below for reference.     Microbiology: No results found for this or any previous visit (from the past 240 hour(s)).   Labs: BNP (last 3 results) No results for input(s): BNP in the last 8760 hours. Basic Metabolic Panel:  Recent Labs Lab 06/22/17 0202 06/23/17 0416 06/24/17 0427 06/25/17 0341 06/26/17 0535 06/27/17 0401  NA 132* 133* 133* 134* 134* 133*  K 3.9 4.2 4.0 3.8 3.9 3.7  CL 102 100* 100* 102 101 101  CO2 23 25 26 26 25  22  GLUCOSE 96 87 88 95 83 87  BUN 18 17 21* 23* 27* 28*  CREATININE 1.01 1.12 1.27* 1.12 1.48* 1.36*  CALCIUM 9.0 9.4 9.6  9.2 9.3 9.1  MG 1.5* 2.0 1.8 1.9 1.8  --   PHOS 5.5* 5.7* 5.6* 5.0* 5.5* 4.8*   Liver Function Tests:  Recent Labs Lab 06/21/17 1840  06/23/17 0416 06/24/17 0427 06/25/17 0341 06/26/17 0535 06/27/17 0401  AST 117*  --   --   --   --   --   --   ALT 122*  --   --   --   --   --   --   ALKPHOS 180*  --   --   --   --   --   --   BILITOT 0.4  --   --   --   --   --   --   PROT 9.4*  --   --   --   --   --   --   ALBUMIN 3.2*  < > 3.2* 3.3* 3.2* 3.3* 3.1*  < > = values in this interval not displayed. No results for input(s): LIPASE, AMYLASE in the last 168 hours. No results for input(s): AMMONIA in the last 168 hours. CBC:  Recent Labs Lab 06/22/17 0202  WBC 2.7*  HGB 10.5*  HCT 30.9*  MCV 86.6  PLT 280   Cardiac Enzymes: No results for input(s): CKTOTAL, CKMB, CKMBINDEX, TROPONINI in the last 168 hours. BNP: Invalid input(s): POCBNP CBG:  Recent Labs Lab 06/23/17 1610 06/23/17 2104 06/24/17 0611 06/24/17 1137 06/24/17 1613  GLUCAP 83 88 91 98 73   D-Dimer No results for input(s): DDIMER in the last 72 hours. Hgb A1c No results for input(s): HGBA1C in the last 72 hours. Lipid Profile No results for input(s): CHOL, HDL, LDLCALC, TRIG, CHOLHDL, LDLDIRECT in the last 72 hours. Thyroid function studies No results for input(s): TSH, T4TOTAL, T3FREE, THYROIDAB in the last 72 hours.  Invalid input(s): FREET3 Anemia work up No results for input(s): VITAMINB12, FOLATE, FERRITIN, TIBC, IRON, RETICCTPCT in the last 72 hours. Urinalysis    Component Value Date/Time   COLORURINE YELLOW 05/20/2017 1742   APPEARANCEUR CLEAR 05/20/2017 1742   LABSPEC 1.020 05/20/2017 1742   PHURINE 7.0 05/20/2017 1742   GLUCOSEU NEGATIVE 05/20/2017 1742   HGBUR NEGATIVE 05/20/2017 1742   BILIRUBINUR NEGATIVE 05/20/2017 1742   KETONESUR NEGATIVE 05/20/2017 1742   PROTEINUR NEGATIVE 05/20/2017 1742   UROBILINOGEN 4.0 (H) 09/09/2014 1055   NITRITE NEGATIVE 05/20/2017 1742    LEUKOCYTESUR NEGATIVE 05/20/2017 1742   Sepsis Labs Invalid input(s): PROCALCITONIN,  WBC,  LACTICIDVEN Microbiology No results found for this or any previous visit (from the past 240 hour(s)).   Time coordinating discharge: Over 30 minutes  SIGNED:   Richarda Overlie, MD  [facility] 06/27/2017, 8:36 AM Pager   If 7PM-7AM, please contact night-coverage [email or web address] Password [email or web address]

## 2017-06-27 NOTE — Care Management Note (Signed)
Case Management Note  Patient Details  Name: Justin Ball MRN: [id number] Date of Birth: 02/23/1991  Subjective/Objective:                    Action/Plan: Patient discharging home with family. Per patients mother the DME will arrive between 1-5 pm. Family transporting the patient home.  Patients grandmother has some of his mail order  medications at her home that the patients mother is going to pick up.  Expected Discharge Date:  06/27/17               Expected Discharge Plan:  Skilled Nursing Facility  In-House Referral:  Chaplain  Discharge planning Services  CM Consult  Post Acute Care Choice:  Home Health, Durable Medical Equipment Choice offered to:  Parent  DME Arranged:  Hospital bed, Wheelchair manual, 3-N-1 (family has 3 in 1 at home) DME Agency:  [facility]  HH Arranged:  RN, PT, OT, Nurse's Aide, Social Work [facility]  Status of Service:  Completed, signed off  If discussed at [employer] Length of Stay Meetings, dates discussed:    Additional Comments:  Kermit BaloKelli F Marvelous Woolford, RN 06/27/2017, 2:57 PM

## 2017-06-27 NOTE — Progress Notes (Signed)
    Durable Medical Equipment        Start     Ordered   06/27/17 1027  For home use only DME standard manual wheelchair with seat cushion  Once    Comments:  Patient suffers from severe deconditioning which impairs their ability to perform daily activities like dressing in the home.  A walker will not resolve  issue with performing activities of daily living. A wheelchair will allow patient to safely perform daily activities. Patient can safely propel the wheelchair in the home or has a caregiver who can provide assistance.  Accessories: elevating leg rests (ELRs), wheel locks, extensions and anti-tippers.   06/27/17 1026   06/26/17 1500  For home use only DME 3 n 1  Once     06/26/17 1459   06/26/17 1243  For home use only DME Hospital bed  Once    Question Answer Comment  Patient has (list medical condition): cryptococcal meningitis, encephalitis, AIDS   The above medical condition requires: Patient requires the ability to reposition frequently   Head must be elevated greater than: 30 degrees   Bed type Semi-electric   Support Surface: Gel Overlay      06/26/17 1244

## 2017-06-27 NOTE — Care Management (Signed)
    Durable Medical Equipment        Start     Ordered   06/26/17 1500  For home use only DME 3 n 1  Once     06/26/17 1459   06/26/17 1244  For home use only DME standard manual wheelchair with seat cushion  Once    Comments:  Patient suffers from severe deconditioning which impairs their ability to perform daily activities like dressing in the home.  A walker will not resolve  issue with performing activities of daily living. A wheelchair will allow patient to safely perform daily activities. Patient can safely propel the wheelchair in the home or has a caregiver who can provide assistance.  Accessories: elevating leg rests (ELRs), wheel locks, extensions and anti-tippers.   06/26/17 1244   06/26/17 1243  For home use only DME Hospital bed  Once    Question Answer Comment  Patient has (list medical condition): cryptococcal meningitis, encephalitis, AIDS   The above medical condition requires: Patient requires the ability to reposition frequently   Head must be elevated greater than: 30 degrees   Bed type Semi-electric   Support Surface: Gel Overlay      06/26/17 1244

## 2017-06-28 ENCOUNTER — Other Ambulatory Visit: Payer: Self-pay

## 2017-06-28 MED ORDER — QUETIAPINE FUMARATE 25 MG PO TABS: 25.0000 mg | ORAL_TABLET | Freq: Two times a day (BID) | ORAL | 0 refills | Status: DC | PRN

## 2017-06-28 MED ORDER — LEVETIRACETAM 1000 MG PO TABS
1000.0000 mg | ORAL_TABLET | Freq: Two times a day (BID) | ORAL | 0 refills | Status: DC
Start: 2017-06-28 — End: 2017-06-28

## 2017-06-28 MED ORDER — FLUCONAZOLE 200 MG PO TABS: 400.0000 mg | ORAL_TABLET | Freq: Every day | ORAL | 1 refills | Status: DC

## 2017-06-28 MED ORDER — AZITHROMYCIN 600 MG PO TABS: 1200.0000 mg | ORAL_TABLET | ORAL | 1 refills | Status: DC

## 2017-06-28 MED ORDER — SULFAMETHOXAZOLE-TRIMETHOPRIM 400-80 MG PO TABS: 1.0000 | ORAL_TABLET | ORAL | 1 refills | Status: DC

## 2017-06-28 MED ORDER — ENSURE ENLIVE PO LIQD: 237.0000 mL | Freq: Three times a day (TID) | ORAL | 12 refills | Status: DC

## 2017-06-28 MED ORDER — LEVETIRACETAM 1000 MG PO TABS: 1000.0000 mg | ORAL_TABLET | Freq: Two times a day (BID) | ORAL | 0 refills | Status: DC

## 2017-06-28 MED ORDER — PRO-STAT SUGAR FREE PO LIQD: 30.0000 mL | Freq: Three times a day (TID) | ORAL | 0 refills | Status: DC

## 2017-06-28 MED ORDER — FREE WATER: 400.0000 mL | Freq: Three times a day (TID) | Status: DC

## 2017-06-28 MED ORDER — ACETAMINOPHEN 325 MG PO TABS: 650.0000 mg | ORAL_TABLET | ORAL | Status: DC | PRN

## 2017-06-28 MED FILL — levETIRAcetam 1000 MG TABS: 1000 | 30 days supply | Qty: 60 | Fill #0

## 2017-06-28 MED FILL — QUETIAPINE FUMARATE 25 MG T: 25 | 30 days supply | Qty: 60 | Fill #0

## 2017-06-28 MED FILL — SULFAMETHOXAZOLE/TMP SS TAB: 400-80 | 30 days supply | Qty: 30 | Fill #1

## 2017-06-28 MED FILL — AZITHROMYCIN 600 MG TABLET: 600 | 14 days supply | Qty: 4 | Fill #0

## 2017-06-28 NOTE — Care Management (Signed)
06/28/2017 at 12:15 pm: Hospital received phone call from [facility] that they would like to try and get Khambrel in sooner to see the MD. CM called patients mother twice and left message once for return call. CM reached out to his grandmother and was able to notify her of the need to call [facility]. She took the information and stated she would call and speak with Marcelino DusterMichelle at [employer].

## 2017-06-29 ENCOUNTER — Telehealth: Payer: Self-pay | Admitting: *Deleted

## 2017-06-29 LAB — CULTURE, FUNGUS WITHOUT SMEAR

## 2017-06-29 NOTE — Telephone Encounter (Signed)
Attempted to contact patient to follow up on Dr. Zenaida NieceVan Dam's message.  No answer.  Patient's grandmother to call back 07/02/17 to schedule [facility] pharmacy appointment.

## 2017-07-01 NOTE — Telephone Encounter (Signed)
Justin Ball is in a [facility]. He himself is not at place where he will likely be able to communicate anything. Hopefully the SNF is listed in the DC summary

## 2017-07-02 ENCOUNTER — Telehealth: Payer: Self-pay | Admitting: *Deleted

## 2017-07-02 NOTE — Telephone Encounter (Signed)
1) Patient's mother is asking for a letter to be faxed to DSS, attention Ms. Hill, stating that Ed has a condition that prevents him from working at this time. Fax number is [phone].  2) RN spoke with patient's grandmother to make pharmacy follow up appointment on 7/17 at 11:00 (time/date at her request for their convenience).  Patient has been discharged from SNF to home.  3) Grandmother is looking for assistance/resource for pull ups/diapers. [facility] [employer]). Andree CossHowell, Kaitlan Bin M, RN

## 2017-07-02 NOTE — Telephone Encounter (Signed)
-----   Message from Beatris Shipourtney L Nelson sent at 07/02/2017  9:38 AM EDT ----- Mother has a question about getting some paperwork filled out regarding care for her son. Asking for a call @ [phone].

## 2017-07-03 ENCOUNTER — Telehealth: Payer: Self-pay | Admitting: *Deleted

## 2017-07-03 ENCOUNTER — Encounter: Payer: Self-pay | Admitting: *Deleted

## 2017-07-03 ENCOUNTER — Telehealth: Payer: Self-pay | Admitting: Infectious Disease

## 2017-07-03 ENCOUNTER — Encounter: Payer: Self-pay | Admitting: Infectious Disease

## 2017-07-03 LAB — FUNGUS CULTURE RESULT

## 2017-07-03 LAB — FUNGUS CULTURE WITH STAIN

## 2017-07-03 LAB — FUNGAL ORGANISM REFLEX

## 2017-07-03 NOTE — Telephone Encounter (Signed)
I wrote a letter

## 2017-07-03 NOTE — Telephone Encounter (Signed)
Letter written re fact that he is NOT in shape to return to work now. We will evaluate his progress

## 2017-07-03 NOTE — Telephone Encounter (Signed)
Call from Ms. Hill with Social Services stating the letter that she received via fax from Micael HampshireMichelle Howell, RN states that patient is currently residing in a skilled nursing facility. Advised that I believe this was an MD error, as per Michelle's epic note it stated yesterday that he has been discharged from the facility to home. If MD is not available to sign the letter, it can be signed by Marcelino DusterMichelle. There was also a discrepancy regarding whether he is living with the grandmother or mother. As his mother was with him at [facility] today. Possibly the letter could state (family member). Her fax # is [phone]. Phone [phone].

## 2017-07-03 NOTE — Telephone Encounter (Signed)
Dr Clinton GallantVan Dam's letter faxed as requested by patient's Grandmother. Andree CossHowell, Doniven Vanpatten M, RN

## 2017-07-04 NOTE — Telephone Encounter (Signed)
Faxed new letter with correct information.

## 2017-07-10 ENCOUNTER — Ambulatory Visit (INDEPENDENT_AMBULATORY_CARE_PROVIDER_SITE_OTHER)
Payer: No Typology Code available for payment source | Admitting: Pharmacist Clinician (PhC)/ Clinical Pharmacy Specialist

## 2017-07-10 DIAGNOSIS — B20 Human immunodeficiency virus [HIV] disease: Secondary | ICD-10-CM | POA: Diagnosis not present

## 2017-07-10 LAB — CBC WITH DIFFERENTIAL/PLATELET
Basophils Absolute: 40 cells/uL (ref 0–200)
Basophils Relative: 2 %
Eosinophils Absolute: 140 cells/uL (ref 15–500)
Eosinophils Relative: 7 %
HEMATOCRIT: 33 % — AB (ref 38.5–50.0)
Hemoglobin: 11.4 g/dL — ABNORMAL LOW (ref 13.2–17.1)
LYMPHS ABS: 600 {cells}/uL — AB (ref 850–3900)
LYMPHS PCT: 30 %
MCH: 29.3 pg (ref 27.0–33.0)
MCHC: 34.5 g/dL (ref 32.0–36.0)
MCV: 84.8 fL (ref 80.0–100.0)
MONO ABS: 80 {cells}/uL — AB (ref 200–950)
MPV: 9.8 fL (ref 7.5–12.5)
Monocytes Relative: 4 %
Neutro Abs: 1140 cells/uL — ABNORMAL LOW (ref 1500–7800)
Neutrophils Relative %: 57 %
PLATELETS: 257 10*3/uL (ref 140–400)
RBC: 3.89 MIL/uL — AB (ref 4.20–5.80)
RDW: 13.4 % (ref 11.0–15.0)
WBC: 2 10*3/uL — ABNORMAL LOW (ref 3.8–10.8)

## 2017-07-10 LAB — COMPLETE METABOLIC PANEL WITH GFR
ALBUMIN: 3.7 g/dL (ref 3.6–5.1)
ALK PHOS: 148 U/L — AB (ref 40–115)
ALT: 40 U/L (ref 9–46)
AST: 50 U/L — ABNORMAL HIGH (ref 10–40)
BILIRUBIN TOTAL: 0.4 mg/dL (ref 0.2–1.2)
BUN: 23 mg/dL (ref 7–25)
CO2: 15 mmol/L — ABNORMAL LOW (ref 20–31)
Calcium: 8.9 mg/dL (ref 8.6–10.3)
Chloride: 104 mmol/L (ref 98–110)
Creat: 1.67 mg/dL — ABNORMAL HIGH (ref 0.60–1.35)
GFR, EST AFRICAN AMERICAN: 64 mL/min (ref >=60)
GFR, EST NON AFRICAN AMERICAN: 56 mL/min — AB (ref >=60)
Glucose, Bld: 98 mg/dL (ref 65–99)
Potassium: 3.4 mmol/L — ABNORMAL LOW (ref 3.5–5.3)
Sodium: 132 mmol/L — ABNORMAL LOW (ref 135–146)
TOTAL PROTEIN: 8.6 g/dL — AB (ref 6.1–8.1)

## 2017-07-10 MED FILL — FLUCONAZOLE 200 MG TABLET: 200 | 30 days supply | Qty: 60 | Fill #0

## 2017-07-10 MED FILL — AZITHROMYCIN 600 MG TAB: 600 | 14 days supply | Qty: 4 | Fill #1

## 2017-07-10 NOTE — Progress Notes (Signed)
HPI: Justin Ball is a 26 y.o. male who is here with his mother and friend to f/u with pharmacy to check up on status after being dc from the hospital  Allergies: Allergies  Allergen Reactions  . No Known Allergies     Vitals:    Past Medical History: Past Medical History:  Diagnosis Date  . Anxiety   . Bipolar 1 disorder (HCC)   . Depression   . HIV infection (HCC)   . Neuromuscular disorder (HCC)   . Scabies   . 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 Substance abuse   . Syphilis     Social History: Social History   Social History  . Marital status: Single    Spouse name: N/A  . Number of children: N/A  . Years of education: N/A   Social History Main Topics  . Smoking status: Current Every Day Smoker    Packs/day: 0.25    Years: 5.00    Types: Cigarettes  . Smokeless tobacco: Never Used  . Alcohol use 0.0 oz/week     Comment: occasionally   . Drug use: No  . Sexual activity: Yes    Partners: Male    Birth control/ protection: Condom     Comment: irregular condom use; educated   Other Topics Concern  . Not on file   Social History Narrative  . No narrative on file    Previous Regimen: DRV/r/TRV/ DTG/TRV  Current Regimen: Odefsey/DTG  Labs: HIV 1 RNA Quant (copies/mL)  Date Value  05/20/2017 83,400  04/28/2015 39,128 (H)  01/19/2015 1,029 (H)   CD4 T Cell Abs (/uL)  Date Value  04/18/2017 10 (L)  09/12/2016 20 (L)  04/28/2015 90 (L)   Hep B S Ab (no units)  Date Value  03/07/2017 Non Reactive   Hepatitis B Surface Ag (no units)  Date Value  03/07/2017 Confirm. indicated   HCV Ab (no units)  Date Value  10/10/2012 NEGATIVE    CrCl: Estimated Creatinine Clearance: 71.3 mL/min (A) (by C-G formula based on SCr of 1.36 mg/dL (H)).  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 He was actually discharged home rather than SNF. He presented here today in wheel chair. He still has a lot of mental deficit. He has not idea who I am. His mom has been helping him with his medication daily. ART was restarted in early July before discharge. He is taking them daily with food. He still has active insurance through [employer] but they are applying him for medicaid. Due to the complexity of his issue, we will repeat labs today to check on his clinical status. Gave them a pillbox to use to organize his meds. He'll come back to see Dr. Drue Second in August for a follow up. Encouraged his mom to make sure he take his meds everyday to improve his immune system. They will stop by [facility] pharmacy today to pick up the fluconazole and septra. He will need at least 6 wks of fluconazole 400mg  daily for his meningitis before tapering down to 200mg .    ABX course: Ampho B - 5/27>>6/26 Fluconazole (400mg ) 6/26>>  Recommendations:  Continue Odefsey/DTG Continue Septra SS  daily' Continue Fluconazole 400mg  PO qday F/u with Dr. Drue SecondSnider in August  Justin Ball, PharmD, BCPS, AAHIVP, CPP Clinical Infectious Disease Pharmacist [facility] 07/10/2017, 3:06 PM

## 2017-07-10 NOTE — Patient Instructions (Addendum)
Follow up with Dr Drue SecondSnider on 8/9 at 10:45 We'll do labs at that time

## 2017-07-11 LAB — T-HELPER CELL (CD4) - (RCID CLINIC ONLY)

## 2017-07-12 ENCOUNTER — Inpatient Hospital Stay (HOSPITAL_COMMUNITY): Payer: No Typology Code available for payment source

## 2017-07-12 ENCOUNTER — Telehealth: Payer: Self-pay | Admitting: Pharmacist Clinician (PhC)/ Clinical Pharmacy Specialist

## 2017-07-12 ENCOUNTER — Encounter (HOSPITAL_COMMUNITY): Payer: Self-pay | Admitting: Emergency Medicine

## 2017-07-12 ENCOUNTER — Observation Stay (HOSPITAL_COMMUNITY)
Admission: EM | Admit: 2017-07-12 | Discharge: 2017-07-12 | DRG: 689 | Disposition: A | Discharge status: Home / Self Care | Payer: No Typology Code available for payment source | Attending: Emergency Medicine | Admitting: Emergency Medicine

## 2017-07-12 DIAGNOSIS — Z8661 Personal history of infections of the central nervous system: Secondary | ICD-10-CM

## 2017-07-12 DIAGNOSIS — N289 Disorder of kidney and ureter, unspecified: Secondary | ICD-10-CM

## 2017-07-12 DIAGNOSIS — N189 Chronic kidney disease, unspecified: Secondary | ICD-10-CM | POA: Diagnosis not present

## 2017-07-12 DIAGNOSIS — R64 Cachexia: Secondary | ICD-10-CM | POA: Diagnosis present

## 2017-07-12 DIAGNOSIS — Z993 Dependence on wheelchair: Secondary | ICD-10-CM | POA: Diagnosis not present

## 2017-07-12 DIAGNOSIS — N179 Acute kidney failure, unspecified: Secondary | ICD-10-CM

## 2017-07-12 DIAGNOSIS — F17210 Nicotine dependence, cigarettes, uncomplicated: Secondary | ICD-10-CM | POA: Diagnosis not present

## 2017-07-12 DIAGNOSIS — I959 Hypotension, unspecified: Secondary | ICD-10-CM | POA: Insufficient documentation

## 2017-07-12 DIAGNOSIS — N3000 Acute cystitis without hematuria: Secondary | ICD-10-CM

## 2017-07-12 DIAGNOSIS — F319 Bipolar disorder, unspecified: Secondary | ICD-10-CM | POA: Diagnosis present

## 2017-07-12 DIAGNOSIS — Z982 Presence of cerebrospinal fluid drainage device: Secondary | ICD-10-CM

## 2017-07-12 DIAGNOSIS — B20 Human immunodeficiency virus [HIV] disease: Secondary | ICD-10-CM | POA: Diagnosis present

## 2017-07-12 DIAGNOSIS — Z681 Body mass index (BMI) 19 or less, adult: Secondary | ICD-10-CM | POA: Diagnosis not present

## 2017-07-12 DIAGNOSIS — N390 Urinary tract infection, site not specified: Secondary | ICD-10-CM | POA: Diagnosis not present

## 2017-07-12 DIAGNOSIS — R339 Retention of urine, unspecified: Secondary | ICD-10-CM | POA: Diagnosis present

## 2017-07-12 DIAGNOSIS — Z8619 Personal history of other infectious and parasitic diseases: Secondary | ICD-10-CM | POA: Diagnosis present

## 2017-07-12 LAB — HEPATIC FUNCTION PANEL
ALBUMIN: 3 g/dL — AB (ref 3.5–5.0)
ALT: 34 U/L (ref 17–63)
AST: 42 U/L — AB (ref 15–41)
Alkaline Phosphatase: 127 U/L — ABNORMAL HIGH (ref 38–126)
BILIRUBIN TOTAL: 0.5 mg/dL (ref 0.3–1.2)
Bilirubin, Direct: 0.1 mg/dL (ref 0.1–0.5)
Indirect Bilirubin: 0.4 mg/dL (ref 0.3–0.9)
TOTAL PROTEIN: 8.3 g/dL — AB (ref 6.5–8.1)

## 2017-07-12 LAB — I-STAT CG4 LACTIC ACID, ED: Lactic Acid, Venous: 0.82 mmol/L (ref 0.5–1.9)

## 2017-07-12 LAB — CBC WITH DIFFERENTIAL/PLATELET
BASOS ABS: 0 10*3/uL (ref 0.0–0.1)
Basophils Relative: 1 %
EOS ABS: 0.2 10*3/uL (ref 0.0–0.7)
EOS PCT: 7 %
HCT: 27 % — ABNORMAL LOW (ref 39.0–52.0)
HEMOGLOBIN: 9.3 g/dL — AB (ref 13.0–17.0)
LYMPHS ABS: 0.7 10*3/uL (ref 0.7–4.0)
LYMPHS PCT: 31 %
MCH: 28.9 pg (ref 26.0–34.0)
MCHC: 34.4 g/dL (ref 30.0–36.0)
MCV: 83.9 fL (ref 78.0–100.0)
Monocytes Absolute: 0.2 10*3/uL (ref 0.1–1.0)
Monocytes Relative: 9 %
NEUTROS PCT: 52 %
Neutro Abs: 1.2 10*3/uL — ABNORMAL LOW (ref 1.7–7.7)
PLATELETS: 198 10*3/uL (ref 150–400)
RBC: 3.22 MIL/uL — AB (ref 4.22–5.81)
RDW: 13.5 % (ref 11.5–15.5)
WBC: 2.2 10*3/uL — AB (ref 4.0–10.5)

## 2017-07-12 LAB — HEPATITIS B DNA, ULTRAQUANTITATIVE, PCR
HEPATITIS B DNA (CALC): 6.81 {Log_IU}/mL — AB
Hepatitis B DNA: 6420000 IU/mL — ABNORMAL HIGH

## 2017-07-12 LAB — BASIC METABOLIC PANEL
ANION GAP: 6 (ref 5–15)
BUN: 22 mg/dL — ABNORMAL HIGH (ref 6–20)
CO2: 20 mmol/L — AB (ref 22–32)
Calcium: 8.9 mg/dL (ref 8.9–10.3)
Chloride: 109 mmol/L (ref 101–111)
Creatinine, Ser: 1.86 mg/dL — ABNORMAL HIGH (ref 0.61–1.24)
GFR, EST AFRICAN AMERICAN: 56 mL/min — AB (ref >60)
GFR, EST NON AFRICAN AMERICAN: 48 mL/min — AB (ref >60)
GLUCOSE: 79 mg/dL (ref 65–99)
POTASSIUM: 3.2 mmol/L — AB (ref 3.5–5.1)
Sodium: 135 mmol/L (ref 135–145)

## 2017-07-12 LAB — URINALYSIS, ROUTINE W REFLEX MICROSCOPIC
BILIRUBIN URINE: NEGATIVE
Glucose, UA: NEGATIVE mg/dL
Ketones, ur: NEGATIVE mg/dL
Nitrite: POSITIVE — AB
PROTEIN: NEGATIVE mg/dL
SPECIFIC GRAVITY, URINE: 1.016 (ref 1.005–1.030)
SQUAMOUS EPITHELIAL / LPF: NONE SEEN
pH: 6 (ref 5.0–8.0)

## 2017-07-12 LAB — T-HELPER CELL (CD4) - (RCID CLINIC ONLY)
CD4 T CELL ABS: 30 /uL — AB (ref 400–2700)
CD4 T CELL HELPER: 5 % — AB (ref 33–55)

## 2017-07-12 LAB — HIV-1 RNA QUANT-NO REFLEX-BLD
HIV 1 RNA Quant: 588 copies/mL — ABNORMAL HIGH
HIV-1 RNA Quant, Log: 2.77 Log copies/mL — ABNORMAL HIGH

## 2017-07-12 LAB — HEPATITIS B E ANTIBODY: Hepatitis Be Antibody: NONREACTIVE

## 2017-07-12 LAB — HEPATITIS B E ANTIGEN: HEPATITIS BE ANTIGEN: REACTIVE — AB

## 2017-07-12 MED ORDER — SODIUM CHLORIDE 0.9 % IV BOLUS (SEPSIS): 1000.0000 mL | Freq: Once | INTRAVENOUS | Status: DC

## 2017-07-12 MED ORDER — CIPROFLOXACIN HCL 500 MG PO TABS: 500.0000 mg | ORAL_TABLET | Freq: Two times a day (BID) | ORAL | 0 refills | Status: DC

## 2017-07-12 MED ORDER — DEXTROSE 5 % IV SOLN
2.0000 g | Freq: Once | INTRAVENOUS | Status: AC
  Administered 2017-07-12: 2 g via INTRAVENOUS
  Filled 2017-07-12: qty 2

## 2017-07-12 MED ORDER — DEXTROSE 5 % IV SOLN: 1.0000 g | Freq: Once | INTRAVENOUS | Status: DC

## 2017-07-12 MED ORDER — SODIUM CHLORIDE 0.9 % IV BOLUS (SEPSIS)
1000.0000 mL | Freq: Once | INTRAVENOUS | Status: AC
  Administered 2017-07-12: 1000 mL via INTRAVENOUS

## 2017-07-12 MED ORDER — SODIUM CHLORIDE 0.9 % IV SOLN: 1000.0000 mL | INTRAVENOUS | Status: DC

## 2017-07-12 MED FILL — CIPROFLOXACIN HCL 500 MG TA: 500 | 5 days supply | Qty: 10 | Fill #0

## 2017-07-12 NOTE — ED Triage Notes (Addendum)
Reports having foley catheter removed on Tuesday at physician office. Went home with it from here on July 4th.  Family reports patient has not voided since Tuesday.  Patient c/o bladder feeling full.  Bladder scan shows >1000 in triage.

## 2017-07-12 NOTE — ED Provider Notes (Addendum)
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 Patient was admitted to the hospital on May 27 and discharged on May 4. While in the hospital he developed urinary retention and had a Foley catheter placed. Foley catheter was removed by the urologist on July 17. Family reports he has not been urinating since that time except for overflow incontinence. He has not had any fevers, cough, vomiting, diarrhea and no seizure. Since his hospitalization patient has had changes in his mental status that they report are unchanged. He is wheelchair-bound and does not talk as much as he used to. Prior to this admission, he was ambulatory, verbal and able to care for himself.  ROS:  Level V Caveat for altered mental status  PAST MEDICAL HISTORY/PAST SURGICAL HISTORY:  Past Medical History:  Diagnosis Date  . Anxiety   . Bipolar 1 disorder (HCC)   . Depression   . HIV infection (HCC)   . Neuromuscular disorder (HCC)   . Scabies   . 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 Substance abuse   . Syphilis     MEDICATIONS:  Prior to Admission medications   Medication Sig Start Date End Date Taking? Authorizing Provider  acetaminophen (TYLENOL) 325 MG tablet Take 2 tablets (650 mg total) by mouth every 4 (four) hours as needed for mild pain (temp > 101.5). 06/28/17   Cliffton Astersampbell, John, MD  Amino Acids-Protein Hydrolys (FEEDING SUPPLEMENT, PRO-STAT SUGAR FREE 64,) LIQD Take 30 mLs by mouth 3 (three) times daily between meals. Patient not taking: Reported on 07/10/2017 06/28/17   Cliffton Astersampbell, John, MD  azithromycin (ZITHROMAX) 600 MG tablet Take 2 tablets (1,200  mg total) by mouth once a week. 06/28/17   Cliffton Astersampbell, John, MD  dolutegravir (TIVICAY) 50 MG tablet Take 1 tablet (50 mg total) by mouth daily. 04/18/17   Randall HissVan Dam, [name], MD  emtricitabine-rilpivir-tenofovir AF (ODEFSEY) 200-25-25 MG TABS tablet Take 1 tablet by mouth daily with breakfast. 06/19/17   Blanchard Kelchixon, Stephanie N, NP  feeding supplement, ENSURE ENLIVE, (ENSURE ENLIVE) LIQD Take 237 mLs by mouth 3 (three) times daily between meals. 06/28/17   Cliffton Astersampbell, John, MD  fluconazole (DIFLUCAN) 200 MG tablet Take 2 tablets (400 mg total) by mouth daily. 06/28/17   Cliffton Astersampbell, John, MD  levETIRAcetam (KEPPRA) 1000 MG tablet Take 1 tablet (1,000 mg total) by mouth 2 (two) times daily. 06/28/17   Cliffton Astersampbell, John, MD  QUEtiapine (SEROQUEL) 25 MG tablet Take 1 tablet (25 mg total) by mouth 2 (two) times daily as needed (agitation). 06/28/17   Cliffton Astersampbell, John, MD  sulfamethoxazole-trimethoprim (BACTRIM,SEPTRA) 400-80 MG tablet Take 1 tablet by mouth 3 (three) times a week. 06/29/17   Cliffton Astersampbell, John, MD  Water For Irrigation, Sterile (FREE WATER) SOLN Take 400 mLs by mouth every 8 (eight) hours. 06/28/17   Cliffton Astersampbell, John, MD    ALLERGIES:  Allergies  Allergen Reactions  . No Known Allergies     SOCIAL HISTORY:  Social History  Substance Use Topics  . Smoking status: Current Every Day Smoker    Packs/day: 0.25    Years: 5.00    Types: Cigarettes  . Smokeless tobacco: Never Used  . Alcohol use 0.0 oz/week  Comment: occasionally     FAMILY HISTORY: Family History  Problem Relation Age of Onset  . Hypertension Other     EXAM: BP 96/78   Pulse 84   Temp 97.6 F (36.4 C) (Oral)   Resp 18   Ht 6\' 1"  (1.854 m)   Wt 61.2 kg (135 lb)   SpO2 100%   BMI 17.81 kg/m  CONSTITUTIONAL: Alert But patent is unable to answer questions or follow commands, he appears cachectic and chronically ill, he is afebrile and nontoxic appearing HEAD: Normocephalic EYES: Conjunctivae clear, pupils appear equal, EOMI ENT:  normal nose; moist mucous membranes NECK: Supple, no meningismus, no nuchal rigidity, no LAD  CARD: RRR; S1 and S2 appreciated; no murmurs, no clicks, no rubs, no gallops RESP: Normal chest excursion without splinting or tachypnea; breath sounds clear and equal bilaterally; no wheezes, no rhonchi, no rales, no hypoxia or respiratory distress, speaking full sentences ABD/GI: Normal bowel sounds; non-distended; soft, non-tender, no rebound, no guarding, no peritoneal signs, no hepatosplenomegaly BACK:  Normal ROM, back appears normal GU:  Normal external genitalia, no penile discharge, no blood at the urethral meatus EXT: Normal ROM in all joints; non-tender to palpation; no edema; normal capillary refill; no cyanosis, no calf tenderness or swelling    SKIN: Normal color for age and race; warm; no rash NEURO: Moves all extremities equally   MEDICAL DECISION MAKING: Patient here with urinary retention. Recently had complex medical admission and developed urinary retention while in the hospital. Followed by outpatient urology. Foley catheter removed less than 48 hours ago. Patient is unable to tell me if he is having back pain. He moves all his extremities but also unable to test neurologic exam given his changes in mental status since his last admission from meningitis/ventriculitis. Family reports no fevers, cough, vomiting, diarrhea, changes in mental status or seizures. He had over 1000 mL of urine in his bladder on bladder scan. We have placed another Foley catheter without difficulty. Will check urinalysis. It does appear the patient has had a slightly rising creatinine over the past several days based on his blood work. Given patient can't give me any history because of his mental condition and his very significant recent admission, I will check basic blood work here.  ED PROGRESS: Patient has had some soft blood pressures here which she does appear to have had intermittently throughout his last day  however his blood pressures prior to discharge were improving. Here he is leukopenic and neutropenic and has worsening renal function. He does have a nitrite positive urinary tract infection. We'll send a urine culture as well as blood cultures and lactate. We'll give IV fluids and cefepime given recent Foley catheter in place and history of being immunocompromised. No history of previous urine culture in our system. I feel patient should be admitted given that he has AIDS and his last CD4 count was 10. Patient's family is comfortable with this plan.  7:25 AM Discussed patient's case with hospitalist, Cordelia Pen NP.  I have recommended admission and patient (and family if present) agree with this plan. Admitting physician will place admission orders.   I reviewed all nursing notes, vitals, pertinent previous records, EKGs, lab and urine results, imaging (as available).    CRITICAL CARE Performed by: Raelyn Number   Total critical care time: 45 minutes  Critical care time was exclusive of separately billable procedures and treating other patients.  Critical care was necessary to treat or prevent imminent or life-threatening  deterioration.  Critical care was time spent personally by me on the following activities: development of treatment plan with patient and/or surrogate as well as nursing, discussions with consultants, evaluation of patient's response to treatment, examination of patient, obtaining history from patient or surrogate, ordering and performing treatments and interventions, ordering and review of laboratory studies, ordering and review of radiographic studies, pulse oximetry and re-evaluation of patient's condition.    Ward, Layla Maw, DO 07/12/17 1610    Ward, Layla Maw, DO 07/12/17 0730

## 2017-07-12 NOTE — Consult Note (Signed)
Medical Consultation   Justin Ball  UJW:[id number]  DOB: 1991/06/16  DOA: 07/12/2017  PCP: Randall Hiss, MD   Outpatient Specialists:  Neurosurgery: Conchita Paris Urology:    Requesting physician: Ward, DO [facility]  Reason for consultation: Urinary retention, possible admit   History of Present Illness:  Justin Ball is a 26 y.o. 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 Conchita Paris. Pmh also includes AIDS, syphillis, chronic hep B, substance abuse, tobacco abuse, bipolar disorder, med noncompliance.  Further complications from prolonged hospitalization included strep pneumo bacteremia, bordatella respiratory infections, ongoing metabolic encephalopathy and urinary retention.   He was discharged with indwelling foley after failing voiding trial x 2 while inpt. He was seen in office by urology on 7/17 at which time foley was d/c'd. Sister reports pt has been unable to void since that office visit prompting visit to ED. She denies any recent fever or chills, mentation is at baseline, no recent complaints of chest pain, sob, n/v/d or abdominal pain. Poor po intake, but this is not new for pt. Pt lives with mother and sister. She states he will ambulate some around th house, otherwise uses wheelchair.    Recent ABX course:  Ampho B 5/27>>6/26 Fluconazole 6/26>> (plan 6 wks before taper to 200mg ) Septra >>prophylactic dose Azith>>prophylactic dose  ED Course: Foley catheter placed on arrival with >500cc residual. Lactic acid 0.82. Creatinine 1.86 from 1.67 on 7/17 (baseline appears to be around 1.1). U/A hazy with many bacteria, +nitrites. Otherwise, labs otherwise unremarkable. SBP 80's 90's consistent with recent discharge. IV Maxipime given by ED.     Review of Systems:  ROS As per HPI otherwise 10 point review of systems negative.    Past Medical History: Past Medical History:  Diagnosis Date  . Acute renal insufficiency 07/12/2017  . Anxiety   . Bipolar 1 disorder (HCC)   . Depression   . HIV infection (HCC)   . Neuromuscular disorder (HCC)   . Scabies   . 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 Substance abuse   . Syphilis     Past Surgical History: Past Surgical History:  Procedure Laterality Date  . EXAMINATION UNDER ANESTHESIA N/A 12/28/2014   Procedure: EXAM UNDER ANESTHESIA;  Surgeon: Karie Soda, MD;  Location: [facility];  Service: General;  Laterality: N/A;  . FLOOR OF MOUTH BIOPSY N/A 09/16/2016   Procedure: BIOPSY OF ORAL ABCESS;  Surgeon: Newman Pies, MD;  Location: [facility] OR;  Service: ENT;  Laterality: N/A;  . INCISION AND DRAINAGE ABSCESS N/A 09/16/2016   Procedure: INCISION AND DRAINAGE ABSCESS;  Surgeon: Newman Pies, MD;  Location: [facility] OR;  Service: ENT;  Laterality: N/A;  . INCISION AND DRAINAGE PERIRECTAL ABSCESS  08/27/2010   Dr Carolynne Edouard  . INCISION AND DRAINAGE PERIRECTAL ABSCESS N/A 12/28/2014   Procedure: IRRIGATION AND DEBRIDEMENT PERIRECTAL ABSCESS;  Surgeon: Karie Soda, MD;  Location: [facility];  Service: General;  Laterality: N/A;  Fistula repair and ablation  . LAPAROSCOPIC REVISION VENTRICULAR-PERITONEAL (V-P) SHUNT N/A 06/12/2017   Procedure: LAPAROSCOPIC REVISION VENTRICULAR-PERITONEAL (V-P) SHUNT;  Surgeon: Kinsinger, De Blanch, MD;  Location: [facility] OR;  Service: General;  Laterality: N/A;  . VENTRICULOPERITONEAL SHUNT N/A 06/12/2017   Procedure: SHUNT INSERTION VENTRICULAR-PERITONEAL;  Surgeon: [name], MD;  Location: [facility]  OR;  Service: Neurosurgery;  Laterality: N/A;     Allergies:   Allergies  Allergen Reactions  . No Known Allergies      Social History:  reports that he has been smoking Cigarettes.  He has a 1.25 pack-year smoking history. He has never used smokeless tobacco. He reports that he  drinks alcohol. He reports that he does not use drugs.   Family History: Family History  Problem Relation Age of Onset  . Hypertension Other        Physical Exam: Vitals:   07/12/17 0645 07/12/17 0700 07/12/17 0715 07/12/17 0730  BP: (!) 86/58 (!) 88/61 (!) 88/62 (!) 89/61  Pulse:    71  Resp:      Temp:      TempSrc:      SpO2:    100%  Weight:      Height:        Constitutional:Awake and alert, slow to respond to questions and will answer some appropriately, but mostly stares. At baseline per family. Eyes: EOMI, irises appear normal, anicteric sclera,  ENMT: external ears and nose appear normal,            Lips appears normal, oropharynx mucosa, tongue appear normal, membranes moist Neck: neck appears normal, no masses, normal ROM, no thyromegaly, no JVD  CVS: S1-S2 clear, no murmur rubs or gallops, no LE edema, normal pedal pulses  Respiratory:  clear to auscultation bilaterally, no wheezing, rales or rhonchi. Respiratory effort normal. No accessory muscle use.  Abdomen: flat, soft nontender, nondistended, normal bowel sounds, no hepatosplenomegaly, no hernias  Musculoskeletal: : no cyanosis, clubbing or edema noted bilaterally                      Neuro: generalized weakness, strength 3/5 thoughout, sensation, reflexes intact Psych: flat mood and affect, alert, oriented to name Skin: no rashes or lesions or ulcers, no induration or nodules    Data reviewed:  I have personally reviewed following labs and imaging studies Labs:  CBC:  Recent Labs Lab 07/10/17 1152 07/12/17 0622  WBC 2.0* 2.2*  NEUTROABS 1,140* 1.2*  HGB 11.4* 9.3*  HCT 33.0* 27.0*  MCV 84.8 83.9  PLT 257 198    Basic Metabolic Panel:  Recent Labs Lab 07/10/17 1152 07/12/17 0624  NA 132* 135  K 3.4* 3.2*  CL 104 109  CO2 15* 20*  GLUCOSE 98 79  BUN 23 22*  CREATININE 1.67* 1.86*  CALCIUM 8.9 8.9   GFR Estimated Creatinine Clearance: 52.1 mL/min (A) (by C-G formula based on SCr of  1.86 mg/dL (H)). Liver Function Tests:  Recent Labs Lab 07/10/17 1152  AST 50*  ALT 40  ALKPHOS 148*  BILITOT 0.4  PROT 8.6*  ALBUMIN 3.7   No results for input(s): LIPASE, AMYLASE in the last 168 hours. No results for input(s): AMMONIA in the last 168 hours. Coagulation profile No results for input(s): INR, PROTIME in the last 168 hours.  Cardiac Enzymes: No results for input(s): CKTOTAL, CKMB, CKMBINDEX, TROPONINI in the last 168 hours. BNP: Invalid input(s): POCBNP CBG: No results for input(s): GLUCAP in the last 168 hours. D-Dimer No results for input(s): DDIMER in the last 72 hours. Hgb A1c No results for input(s): HGBA1C in the last 72 hours. Lipid Profile No results for input(s): CHOL, HDL, LDLCALC, TRIG, CHOLHDL, LDLDIRECT in the last 72 hours. Thyroid function studies No results for input(s): TSH, T4TOTAL, T3FREE, THYROIDAB in the last 72 hours.  Invalid input(s): FREET3 Anemia work up No results for input(s): VITAMINB12, FOLATE, FERRITIN, TIBC, IRON, RETICCTPCT in the last 72 hours. Urinalysis    Component Value Date/Time   COLORURINE YELLOW 07/12/2017 0544   APPEARANCEUR HAZY (A) 07/12/2017 0544   LABSPEC 1.016 07/12/2017 0544   PHURINE 6.0 07/12/2017 0544   GLUCOSEU NEGATIVE 07/12/2017 0544   HGBUR SMALL (A) 07/12/2017 0544   BILIRUBINUR NEGATIVE 07/12/2017 0544   KETONESUR NEGATIVE 07/12/2017 0544   PROTEINUR NEGATIVE 07/12/2017 0544   UROBILINOGEN 4.0 (H) 09/09/2014 1055   NITRITE POSITIVE (A) 07/12/2017 0544   LEUKOCYTESUR LARGE (A) 07/12/2017 0544     Microbiology No results found for this or any previous visit (from the past 240 hour(s)).     Inpatient Medications:   Scheduled Meds: Continuous Infusions: . sodium chloride    . ceFEPime (MAXIPIME) IV    . sodium chloride    . sodium chloride 1,000 mL (07/12/17 0749)     Radiological Exams on Admission: Dg Chest Portable 1 View  Result Date: 07/12/2017 CLINICAL DATA:   Hypotension.  Possible sepsis. EXAM: PORTABLE CHEST 1 VIEW COMPARISON:  06/13/2017 FINDINGS: VP shunt on the right, continuous where seen. There is no edema, consolidation, effusion, or pneumothorax. Normal heart size and mediastinal contours. IMPRESSION: No evidence of active disease. Electronically Signed   By: Marnee Spring M.D.   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)   History of syphilis   S/P VP shunt   Urinary retention   Acute renal insufficiency  Urinary retention -recent hx of same failing voiding trial on 6/25, 7/2 and again on 7/17. Per recent notes, pt suspected to have bladder stretch injury that may require chronic foley. ->500cc residual when catheter placed. Instructed family that foley will need to be left in and f/u with Dr Wilson Singer  Possible UTI vs chronic colonization from indwelling foley -urine culture pending -Maxipime dose in ED -spoke with Dr Luciana Axe who recommends Cipro 500 BID x 4 days as likely resistant to Septra pt prophylactically on  -follow-up urine culture  Acute on chronic renal insufficiency -mild increase in creatinine 1.86 from 1.67 on 7/17 due to above issues.  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 Rolanda Jay 6/19 -continue keppra for seizure prophylaxis  -continue Fluconazole per ID 400mg  daily, taper per ID  AIDS -hx of poor compliance -CD4 count 2 on 4/15, HIV 1 RNA 83,000 on 5/17. 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  -continue BID Seroquel.   Disposition -Upon review of hx and current exam, pt is felt stable for discharge home from ED. He does not appear septic and with no recent fever, lactic acid wnl and pt is nontoxic appearing. Cipro per ID recommendations as stated above. Close outpt follow-up with ID and urology as discussed with sister at bedside.    Thank you for this consultation.     Time Spent: 45 min  Cordelia Pen, NP-C [facility]  pgr [phone]

## 2017-07-12 NOTE — Discharge Instructions (Signed)
See the Urologist as requested. Take the antibiotics as requested.  Please return to the ER if your symptoms worsen; you have increased pain, fevers, chills, inability to keep any medications down, confusion. Otherwise see the outpatient doctor as requested.

## 2017-07-12 NOTE — ED Provider Notes (Signed)
Medicine team independently evaluated the patient and feel comfortable discharging him. They spoke with ID who recommended cipro for uti. Urology f/u advised.  Medicine team aware of the HIV status. They checked the BPs, and pt's BP always low and they think slightly bump in Cr is due to post obstructive pathology.  Strict ER return precautions have been discussed, and patient is agreeing with the plan and is comfortable with the workup done and the recommendations from the ER.    Derwood KaplanNanavati, Madisson Kulaga, MD 07/12/17 [phone]

## 2017-07-12 NOTE — Telephone Encounter (Signed)
Justin Ball's mother had to bring him to the ED this AM because he couldn't urinate after urology took out the foley. She called to asked about the Cipro. He has been placed on Cipro x 5days for UTI.

## 2017-07-13 ENCOUNTER — Telehealth: Payer: Self-pay | Admitting: *Deleted

## 2017-07-13 LAB — LIVER FIBROSIS, FIBROTEST-ACTITEST
ALT: 38 U/L (ref 9–46)
APOLIPOPROTEIN A1: 101 mg/dL (ref 94–176)
Alpha-2-Macroglobulin: 176 mg/dL (ref 106–279)
Bilirubin: 0.3 mg/dL (ref 0.2–1.2)
FIBROSIS SCORE: 0.32
GGT: 186 U/L — ABNORMAL HIGH (ref 3–70)
HAPTOGLOBIN: 60 mg/dL (ref 43–212)
Necroinflammat ACT Score: 0.21
REFERENCE ID: 2034339

## 2017-07-13 NOTE — Telephone Encounter (Signed)
Received call from Donia PoundsEdwina Swan, RN Clinical Case Manager at [facility] with an update. She has concerns regarding patient's state of health. He had a foley discontinued, was unable to urinate, then reinserted this week. Shortly thereafter he was in the ED with an UTI, positive for e coli.  He is receiving the medication as prescribed (his mother gives him medication daily), but patient has no understanding of foley maintenance/care/cleaning.  He was dragging his foley line and bag on the floor/ground when Edwina arrived for a home visit today. He can follow orders, but has no recall or understanding.  He needs continuous prompting to accomplish any task. Per Sheral FlowEdwina, patient's caregivers have been educated/reeducated, but she is concerned that they do not understand the seriousness of his situation/health status. She stated she feels he is at risk for readmission to the hospital due to infection. Patient's care givers asked about intensive therapy when she discussed this risk with them. They had previously declined this service at hospital discharge, Sheral Flowdwina is unsure if it can be arranged at this point. She states [facility] has done what they can, as patient can accomplish tasks with prompts, but needs prompts to do anything. Andree CossHowell, Jori Frerichs M, RN

## 2017-07-14 LAB — URINE CULTURE: Culture: >=100000 — AB

## 2017-07-15 ENCOUNTER — Telehealth: Payer: Self-pay

## 2017-07-15 NOTE — Telephone Encounter (Signed)
Post ED Visit - Positive Culture Follow-up  Culture report reviewed by antimicrobial stewardship pharmacist:  []  Enzo BiNathan Batchelder, Pharm.D. []  Celedonio MiyamotoJeremy Frens, Pharm.D., BCPS AQ-ID [x]  Garvin FilaMike Maccia, Pharm.D., BCPS []  Georgina PillionElizabeth Martin, [address] BCPS []  Juno RidgeMinh Pham, [address]Ph[address]D., BCPS, AAHIVP []  Estella HuskMichelle Turner, Pharm.D., BCPS, AAHIVP []  Lysle Pearlachel Rumbarger, PharmD, BCPS []  Casilda Carlsaylor Stone, PharmD, BCPS []  Pollyann SamplesAndy Johnston, PharmD, BCPS  Positive urine culture Treated with Cipro, organism sensitive to the same and no further patient follow-up is required at this time.  Jerry CarasCullom, Alyssia Heese Burnett 07/15/2017, 12:43 PM

## 2017-07-15 NOTE — Telephone Encounter (Signed)
It would need to be arranged from the hospital.

## 2017-07-16 ENCOUNTER — Other Ambulatory Visit: Payer: Self-pay | Admitting: Pharmacist Clinician (PhC)/ Clinical Pharmacy Specialist

## 2017-07-16 MED ORDER — FLUCONAZOLE 200 MG PO TABS: 400.0000 mg | ORAL_TABLET | Freq: Every day | ORAL | 3 refills | Status: DC

## 2017-07-16 NOTE — Progress Notes (Signed)
Will extend out his fluconazole 400mg  qday out several months until CD4 recovers - per Dr. Drue SecondSnider. He has an appt with Dr. Drue SecondSnider in August.

## 2017-07-17 LAB — CULTURE, BLOOD (ROUTINE X 2)
CULTURE: NO GROWTH
Culture: NO GROWTH
SPECIAL REQUESTS: ADEQUATE
Special Requests: ADEQUATE

## 2017-07-20 ENCOUNTER — Other Ambulatory Visit: Payer: Self-pay | Admitting: Pharmacist Clinician (PhC)/ Clinical Pharmacy Specialist

## 2017-07-20 MED ORDER — QUETIAPINE FUMARATE 25 MG PO TABS: 25.0000 mg | ORAL_TABLET | Freq: Two times a day (BID) | ORAL | 4 refills | Status: DC | PRN

## 2017-07-20 MED ORDER — EMTRICITAB-RILPIVIR-TENOFOV AF 200-25-25 MG PO TABS: 1.0000 | ORAL_TABLET | Freq: Every day | ORAL | 6 refills | Status: DC

## 2017-07-20 MED ORDER — SULFAMETHOXAZOLE-TRIMETHOPRIM 400-80 MG PO TABS: 1.0000 | ORAL_TABLET | ORAL | 5 refills | Status: DC

## 2017-07-20 MED ORDER — DOLUTEGRAVIR SODIUM 50 MG PO TABS: 50.0000 mg | ORAL_TABLET | Freq: Every day | ORAL | 6 refills | Status: DC

## 2017-07-20 MED ORDER — LEVETIRACETAM 1000 MG PO TABS: 1000.0000 mg | ORAL_TABLET | Freq: Two times a day (BID) | ORAL | 5 refills | Status: DC

## 2017-07-20 MED ORDER — AZITHROMYCIN 600 MG PO TABS: 1200.0000 mg | ORAL_TABLET | ORAL | 4 refills | Status: DC

## 2017-07-20 NOTE — Progress Notes (Signed)
Refill all meds for Hau

## 2017-07-23 ENCOUNTER — Telehealth: Payer: Self-pay | Admitting: Pharmacist Clinician (PhC)/ Clinical Pharmacy Specialist

## 2017-07-23 MED FILL — AZITHROMYCIN 600 MG TAB: 600 | 28 days supply | Qty: 8 | Fill #0

## 2017-07-23 NOTE — Telephone Encounter (Signed)
Rulon mother called because [facility]. She just wanted to make sure that everything is correct.

## 2017-07-25 ENCOUNTER — Ambulatory Visit: Payer: No Typology Code available for payment source | Admitting: Internal Medicine

## 2017-07-25 NOTE — Telephone Encounter (Signed)
Patient's Mother is calling with questions regarding neurology referral.  She called to schedule appointment and the first available is in September which seems to far out to her. She wonders if he should wait that long.   Since Mother stated the referral was [facility] by the [facility] I am not able to advise regarding the wait time.  I explained to Mother to keep schedule visit with Dr Drue SecondSnider on 08-02-17 so she can make assessment and approbate referral at that time.  In the meantime she was advised to call the [facility] and inform them of the appointment date and time along with her concerns.   Laurell Josephsammy K King, RN

## 2017-07-30 MED FILL — QUETIAPINE FUMARATE 25 MG T: 25 | 30 days supply | Qty: 60 | Fill #0

## 2017-07-30 MED FILL — SULFAMETHOXAZOLE/TMP SS TAB: 400-80 | 28 days supply | Qty: 12 | Fill #0

## 2017-07-30 MED FILL — levETIRAcetam 1000 MG TABS: 1000 | 30 days supply | Qty: 60 | Fill #0

## 2017-07-30 MED FILL — ODEFSEY 200-25-25 MG TABS: 200-25-25 | 30 days supply | Qty: 30 | Fill #1

## 2017-07-30 MED FILL — TIVICAY 50 MG TABLET: 50 | 30 days supply | Qty: 30 | Fill #1

## 2017-07-31 ENCOUNTER — Ambulatory Visit: Payer: No Typology Code available for payment source | Admitting: Physical Therapy

## 2017-07-31 ENCOUNTER — Ambulatory Visit: Payer: No Typology Code available for payment source | Admitting: Occupational Therapy

## 2017-07-31 ENCOUNTER — Encounter: Payer: Self-pay | Admitting: Physical Therapy

## 2017-07-31 ENCOUNTER — Ambulatory Visit: Payer: No Typology Code available for payment source | Attending: Internal Medicine

## 2017-07-31 ENCOUNTER — Encounter: Payer: No Typology Code available for payment source | Admitting: Occupational Therapy

## 2017-07-31 DIAGNOSIS — M6281 Muscle weakness (generalized): Secondary | ICD-10-CM

## 2017-07-31 DIAGNOSIS — R41844 Frontal lobe and executive function deficit: Secondary | ICD-10-CM | POA: Insufficient documentation

## 2017-07-31 DIAGNOSIS — R2689 Other abnormalities of gait and mobility: Secondary | ICD-10-CM | POA: Insufficient documentation

## 2017-07-31 DIAGNOSIS — R41840 Attention and concentration deficit: Secondary | ICD-10-CM | POA: Insufficient documentation

## 2017-07-31 DIAGNOSIS — R2681 Unsteadiness on feet: Secondary | ICD-10-CM

## 2017-07-31 DIAGNOSIS — R278 Other lack of coordination: Secondary | ICD-10-CM | POA: Diagnosis present

## 2017-07-31 DIAGNOSIS — R41842 Visuospatial deficit: Secondary | ICD-10-CM | POA: Insufficient documentation

## 2017-07-31 DIAGNOSIS — R41841 Cognitive communication deficit: Secondary | ICD-10-CM | POA: Insufficient documentation

## 2017-07-31 NOTE — Patient Instructions (Signed)
Sorting simple things - clothes, silverware, cards, coins Asking him to name 3 or 5 of a simple category that is currently visible to him (furniture, clothes, drinks, dishes, etc)

## 2017-07-31 NOTE — Therapy (Addendum)
[facility] [address] [place], [name], [address] Phone: [phone]   Fax:  [phone]  Physical Therapy Evaluation  Patient Details  Name: Justin Ball MRN: [id number] Date of Birth: 1991-01-14 PCP Physician: Acey Lav, MD  Encounter Date: 07/31/2017      PT End of Session - 07/31/17 1236    Visit Number 1   Number of Visits 5   Date for PT Re-Evaluation 09/07/17   Authorization Type [employer], Medicaid pending   PT Start Time 0930   PT Stop Time 1010   PT Time Calculation (min) 40 min   Activity Tolerance Patient tolerated treatment well   Behavior During Therapy Flat affect      Past Medical History:  Diagnosis Date  . Acute renal insufficiency 07/12/2017  . Anxiety   . Bipolar 1 disorder (HCC)   . Depression   . HIV infection (HCC)   . Neuromuscular disorder (HCC)   . Scabies   . 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 Substance abuse   . Syphilis     Past Surgical History:  Procedure Laterality Date  . EXAMINATION UNDER ANESTHESIA N/A 12/28/2014   Procedure: EXAM UNDER ANESTHESIA;  Surgeon: Karie Soda, MD;  Location: [facility];  Service: General;  Laterality: N/A;  . FLOOR OF MOUTH BIOPSY N/A 09/16/2016   Procedure: BIOPSY OF ORAL ABCESS;  Surgeon: Newman Pies, MD;  Location: [facility] OR;  Service: ENT;  Laterality: N/A;  . INCISION AND DRAINAGE ABSCESS N/A 09/16/2016   Procedure: INCISION AND DRAINAGE ABSCESS;  Surgeon: Newman Pies, MD;  Location: [facility] OR;  Service: ENT;  Laterality: N/A;  . INCISION AND DRAINAGE PERIRECTAL ABSCESS  08/27/2010   Dr Carolynne Edouard  . INCISION AND DRAINAGE PERIRECTAL ABSCESS N/A 12/28/2014   Procedure: IRRIGATION AND DEBRIDEMENT PERIRECTAL ABSCESS;  Surgeon: Karie Soda, MD;  Location: [facility];  Service: General;  Laterality: N/A;  Fistula repair and ablation  . LAPAROSCOPIC REVISION VENTRICULAR-PERITONEAL (V-P) SHUNT N/A 06/12/2017   Procedure: LAPAROSCOPIC REVISION VENTRICULAR-PERITONEAL (V-P)  SHUNT;  Surgeon: Kinsinger, De Blanch, MD;  Location: [facility] OR;  Service: General;  Laterality: N/A;  . VENTRICULOPERITONEAL SHUNT N/A 06/12/2017   Procedure: SHUNT INSERTION VENTRICULAR-PERITONEAL;  Surgeon: [name], MD;  Location: [facility] OR;  Service: Neurosurgery;  Laterality: N/A;    There were no vitals filed for this visit.       Subjective Assessment - 07/31/17 0934    Subjective This 26yo male was hospitalized 5/27-06/27/17 with Cryptococcal Meningitis, encephalitis/Neurosyphillis. He has Ventricular-Peritoneal shunt placed.  He was discharged with home health OT/PT/speech which ended 07/30/2017.   Patient is accompained by: Family member   Pertinent History Neurosyphilis, encephalitis, AIDS, HIV-I, Bipolar, Substance abuse, Sz on 05/20/2017, syphilis, scabies, staph ventriculitis, Hep B, Brain shunt,    Patient Stated Goals To be able to balance & walk better. To return to work. He was delivering pizzas.    Currently in Pain? Yes   Pain Score 7    Pain Location Penis   Pain Descriptors / Indicators Other (Comment)  hurts   Pain Onset More than a month ago   Aggravating Factors  catheter, goal is to discontinue by end of Aug.    Pain Relieving Factors NSAIDs            [facility] PT Assessment - 07/31/17 0930      Assessment   Medical Diagnosis Neurosyphilis   Referring Provider Geoffry Paradise MD   Onset Date/Surgical Date 07/30/17   Hand  Dominance Right   Prior Therapy yes  Home Health PT     Precautions   Precautions Other (comment)  No driving     Balance Screen   Has the patient fallen in the past 6 months No   Has the patient had a decrease in activity level because of a fear of falling?  Yes   Is the patient reluctant to leave their home because of a fear of falling?  Yes     Home Environment   Living Environment Private residence   Living Arrangements Other relatives  Will return to home w grandmother; currently lives w mom   Type of Home House  Mother  in mobile home; grandmother in house   Home Access Other (comment)  MH - 11 steps to enter, rail on R up; HOME- no steps to ente   Home Layout One level  Both homes 1 level   Home Equipment Bedside commode;Hospital bed;Tub bench;Wheelchair - manual     Prior Function   Level of Independence Independent   Vocation Full time employment   Vocation Requirements Driving, carrying, walking     Posture/Postural Control   Posture/Postural Control Postural limitations   Postural Limitations Rounded Shoulders;Forward head     ROM / Strength   AROM / PROM / Strength AROM;Strength     AROM   Overall AROM  Within functional limits for tasks performed   Overall AROM Comments Slight bilateral hamstring tightness noted     Strength   Overall Strength Within functional limits for tasks performed   Overall Strength Comments Gross LE MMT in sitting 5/5     Ambulation/Gait   Ambulation/Gait Yes   Ambulation/Gait Assistance 5: Supervision   Ambulation Distance (Feet) 300 Feet   Assistive device None   Gait Pattern Step-through pattern;Decreased arm swing - right;Decreased arm swing - left;Decreased trunk rotation   Ambulation Surface Level;Indoor   Gait velocity 3.35 f/s     Standardized Balance Assessment   Standardized Balance Assessment Berg Balance Test;10 meter walk test   10 Meter Walk 3.35 feet/sec  9.80s     Berg Balance Test   Sit to Stand Able to stand without using hands and stabilize independently   Standing Unsupported Able to stand safely 2 minutes   Sitting with Back Unsupported but Feet Supported on Floor or Stool Able to sit safely and securely 2 minutes   Stand to Sit Sits safely with minimal use of hands   Transfers Able to transfer safely, minor use of hands   Standing Unsupported with Eyes Closed Able to stand 10 seconds safely   Standing Ubsupported with Feet Together Able to place feet together independently and stand 1 minute safely   From Standing, Reach Forward  with Outstretched Arm Can reach confidently >25 cm (10")   From Standing Position, Pick up Object from Floor Able to pick up shoe safely and easily   From Standing Position, Turn to Look Behind Over each Shoulder Looks behind from both sides and weight shifts well   Turn 360 Degrees Able to turn 360 degrees safely but slowly   Standing Unsupported, Alternately Place Feet on Step/Stool Able to stand independently and safely and complete 8 steps in 20 seconds   Standing Unsupported, One Foot in Front Able to plae foot ahead of the other independently and hold 30 seconds   Standing on One Leg Able to lift leg independently and hold > 10 seconds   Total Score 53   Berg comment:  Score >52 means reduced risk of falls     Functional Gait  Assessment   Gait assessed  Yes   Gait Level Surface Walks 20 ft in less than 7 sec but greater than 5.5 sec, uses assistive device, slower speed, mild gait deviations, or deviates 6-10 in outside of the 12 in walkway width.   Change in Gait Speed Able to change speed, demonstrates mild gait deviations, deviates 6-10 in outside of the 12 in walkway width, or no gait deviations, unable to achieve a major change in velocity, or uses a change in velocity, or uses an assistive device.   Gait with Horizontal Head Turns Performs head turns with moderate changes in gait velocity, slows down, deviates 10-15 in outside 12 in walkway width but recovers, can continue to walk.   Gait with Vertical Head Turns Performs task with moderate change in gait velocity, slows down, deviates 10-15 in outside 12 in walkway width but recovers, can continue to walk.   Gait and Pivot Turn Pivot turns safely in greater than 3 sec and stops with no loss of balance, or pivot turns safely within 3 sec and stops with mild imbalance, requires small steps to catch balance.   Step Over Obstacle Is able to step over 2 stacked shoe boxes taped together (9 in total height) without changing gait speed. No  evidence of imbalance.   Gait with Narrow Base of Support Ambulates 4-7 steps.   Gait with Eyes Closed Walks 20 ft, uses assistive device, slower speed, mild gait deviations, deviates 6-10 in outside 12 in walkway width. Ambulates 20 ft in less than 9 sec but greater than 7 sec.   Ambulating Backwards Walks 20 ft, slow speed, abnormal gait pattern, evidence for imbalance, deviates 10-15 in outside 12 in walkway width.   Steps Alternating feet, must use rail.   Total Score 17   FGA comment: Score < 19 indicates increased risk of falls            Objective measurements completed on examination: See above findings.                  PT Education - 07/31/17 1235    Education provided Yes   Education Details Educated on clinical findings, PT goals   Person(s) Educated Patient;Other (comment)  Grandmother   Methods Explanation   Comprehension Verbalized understanding             PT Long Term Goals - 07/31/17 1245      PT LONG TERM GOAL #1   Title Pt will improve score on FGA to >/= 21/30 to decrease risk of falls   Baseline FGA 17 on 07/31/18   Time 4   Period Weeks   Status New   Target Date 09/07/17     PT LONG TERM GOAL #2   Title Pt will ambulate 1000' feet without AD or physical assistance with supervision assistance only for cognition to ensure safe community ambulation   Baseline 300' with supervision for balance and stability   Time 4   Period Weeks   Status New   Target Date 09/07/17     PT LONG TERM GOAL #3   Title Pt will negotiate ramps/curbs/stairs & obstacles without AD or physical assistance with supervision assistance only for cognition to ensure safe community ambulation   Baseline Pt appears dependent on architectural barriers   Time 4   Period Weeks   Status New   Target Date 09/07/17  PT LONG TERM GOAL #4   Title Pt will improve self selected walking speed to >/= 4.17 ft/sec to ensure safe commnuity ambulation   Baseline 3.35  ft/s on 07/31/17   Time 4   Period Weeks   Status New   Target Date 09/07/17                Plan - 07/31/17 1237    Clinical Impression Statement Pt is a 26 yo male who was hospitalized 05/20/17 to 06/27/17 with cryptococcal meningitis/encephalitis/neurosyphillis and VP shunt placement. Pt exhibits generalized weakness and decreased muscular endurance due to long-term hospitalization and disuse. Pt presents with increased risk of falls as exhibited by a FGA score of 17/30 and a gait velocity of 3.35 feet/sec. Pt also has cognitive deficits which inhibit safe independent community ambulation. Pt has been progressing well with home health PT and should benefit from additional services in OP setting in order to improve dynamic balance and functional ambulation.   History and Personal Factors relevant to plan of care: VP shunt, cognitive status, HIV/AIDS, Hep B, bipolar disorder, substance abuse, sickle cell disease, osteoporosis, DM, emphysema, CHF, acute renal insufficiency   Clinical Presentation Unstable   Clinical Presentation due to: VP shunt, cognitive status, HIV/AIDS, Hep B, bipolar disorder, substance abuse, sickle cell disease, osteoporosis, DM, emphysema, CHF, acute renal insufficiency   Clinical Decision Making Moderate   Rehab Potential Good   PT Frequency 1x / week   PT Duration 4 weeks   PT Treatment/Interventions Gait training;Stair training;Functional mobility training;Therapeutic activities;Therapeutic exercise;Balance training;Neuromuscular re-education;Patient/family education   PT Next Visit Plan Dynamic balance activities, ambulation activities, LE strengthening, endurance   PT Home Exercise Plan --   Consulted and Agree with Plan of Care Patient;Family member/caregiver  grandmother      Patient will benefit from skilled therapeutic intervention in order to improve the following deficits and impairments:  Abnormal gait, Decreased balance, Decreased endurance, Decreased  strength, Decreased cognition, Decreased activity tolerance, Decreased safety awareness, Decreased mobility, Postural dysfunction  Visit Diagnosis: Other abnormalities of gait and mobility  Unsteadiness on feet  Muscle weakness (generalized)     Problem List Patient Active Problem List   Diagnosis Date Noted  . Urinary retention 07/12/2017  . Acute renal insufficiency 07/12/2017  . Acute cystitis without hematuria   . Hypotension   . 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 Acute urinary retention 06/26/2017  . Staphylococcus epidermidis ventriculitis 06/26/2017  . Bacteremia due to Streptococcus pneumoniae 06/26/2017  . Bordetella pertussis 06/26/2017  . 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 Transaminitis   . Endotracheal tube present   . Status epilepticus (HCC)   . History of syphilis   . Tobacco abuse   . Meningoencephalitis   . 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 Acute respiratory failure (HCC)   . Seizure (HCC) 05/20/2017  . Peri-rectal abscess 03/06/2017  . 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 Anemia 09/12/2016  . 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 Bipolar disorder (HCC) 04/08/2013  . Rash and nonspecific skin eruption 10/10/2012   Lenda Kelp, SPT 07/31/2017, 4:34 PM  Vladimir Faster, PT, DPT 07/31/2017, 4:57 PM  [facility] [address] [place], [name], [address] Phone: [phone]   Fax:  [phone]  Name: Justin Ball MRN: [id number] Date of Birth: 1990-12-28

## 2017-07-31 NOTE — Therapy (Addendum)
[facility] [address] [place], [name], [address] Phone: [phone]   Fax:  [phone]  Occupational Therapy Evaluation  Patient Details  Name: SEM MCCAUGHEY MRN: [id number] Date of Birth: Nov 27, 1991 Referring Provider: Dr. Paulette Blanch Dam  Encounter Date: 07/31/2017      OT End of Session - 07/31/17 2101    Visit Number 1   Number of Visits 21   Date for OT Re-Evaluation 10/12/17   Authorization Type [employer]--awaiting insurance verification; (Medicaid pending, also applied for Medicare)   OT Start Time 1235   OT Stop Time 1330   OT Time Calculation (min) 55 min   Activity Tolerance Patient tolerated treatment well   Behavior During Therapy Restless;Flat affect;Impulsive      Past Medical History:  Diagnosis Date  . Acute renal insufficiency 07/12/2017  . Anxiety   . Bipolar 1 disorder (HCC)   . Depression   . HIV infection (HCC)   . Neuromuscular disorder (HCC)   . Scabies   . 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 Substance abuse   . Syphilis     Past Surgical History:  Procedure Laterality Date  . EXAMINATION UNDER ANESTHESIA N/A 12/28/2014   Procedure: EXAM UNDER ANESTHESIA;  Surgeon: Karie Soda, MD;  Location: [facility];  Service: General;  Laterality: N/A;  . FLOOR OF MOUTH BIOPSY N/A 09/16/2016   Procedure: BIOPSY OF ORAL ABCESS;  Surgeon: Newman Pies, MD;  [facility]: [facility];  Service: ENT;  Laterality: N/A;  . INCISION AND DRAINAGE ABSCESS N/A 09/16/2016   Procedure: INCISION AND DRAINAGE ABSCESS;  Surgeon: Newman Pies, MD;  [facility]: [facility];  Service: ENT;  Laterality: N/A;  . INCISION AND DRAINAGE PERIRECTAL ABSCESS  08/27/2010   Dr Carolynne Edouard  . INCISION AND DRAINAGE PERIRECTAL ABSCESS N/A 12/28/2014   Procedure: IRRIGATION AND DEBRIDEMENT PERIRECTAL ABSCESS;  Surgeon: Karie Soda, MD;  [facility]: [facility];  Service: General;  Laterality: N/A;  Fistula repair and ablation  . LAPAROSCOPIC REVISION VENTRICULAR-PERITONEAL  (V-P) SHUNT N/A 06/12/2017   Procedure: LAPAROSCOPIC REVISION VENTRICULAR-PERITONEAL (V-P) SHUNT;  Surgeon: Kinsinger, De Blanch, MD;  [facility];  Service: General;  Laterality: N/A;  . VENTRICULOPERITONEAL SHUNT N/A 06/12/2017   Procedure: SHUNT INSERTION VENTRICULAR-PERITONEAL;  Surgeon: [name], MD;  [facility];  Service: Neurosurgery;  Laterality: N/A;    There were no vitals filed for this visit.      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 Geoffry Paradise   Onset  Date 05/20/17   Assessment hospitalized 05/20/17-06/27/17   Prior Therapy in hospital, home health OT, PT, ST     Precautions   Precautions --  no driving, AIDS, Hep B, hx of syphilis, urinary catheter     Balance Screen   Has the patient fallen in the past 6 months No     Home  Environment   Family/patient expects to be discharged to: Private residence   Living Arrangements --  was living with grandmother prior and wishes to return    Available Help at Discharge Family  mother, 2 sisters, and grandmother assists   Additional Comments currently lives with mother and some children or stays with sisters and their children, grandmother also assists   Lives With Family  currently lives with mother     Prior Function   Level of Independence Independent   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, carrying, walking   Leisure played trombone in high school, enjoys eating out, watching movies, driving his Mustang     ADL   Eating/Feeding Modified independent  able to open packages, but needs food cut   Grooming --  prompts--brushing teeth, assist--triming facial hair/nails   Upper Body Bathing Supervision/safety  prompts/cues   Lower Body Bathing Minimal assistance  around catheter, cueing, supervision   Upper Body Dressing --  cueing   Lower Body Dressing --  min-mod A for brief and thread catheter, cueing   Toilet Transfer --  incontinent bowel/bladder, wears depends   Toilet Transfer Details (indicate cue type and reason anticipate removal of catheter soon   Toilet Transfer: Patient Percentage --  able to tell caregivers when he needs to be changed   Toileting - Clothing Manipulation --  dependent   Toileting -  Hygiene --  dependent for changing catheter bag and adult briefs   Where Assessed - Toileting hygiene --  able to empty catheter bag some per caregiver   Tub/Shower Transfer Supervision/safety   Equipment Used --  has w/c, RW, BSC, hospital bed   Transfers/Ambulation Related to ADL's mod I   ADL comments needs 24hr supervision, cueing/promting for ADLs due to cognitive deficits     IADL   Prior Level of Function Shopping independent   Shopping Needs to be accompanied on any shopping trip   Prior Level of Function Light Housekeeping independent:  vacuuming, cleaning room/bathroom, sweeping   Light Housekeeping Does not participate in any housekeeping tasks    Prior Level of Function Meal Prep cooked some meals prior to hospitalization   Meal Prep Needs to have meals prepared and served   Prior Level of Function Community Mobility independent with driving   Community Mobility Relies on family or friends for transportation   Medication Management Is not capable of dispensing or managing own medication  pt was not taking medication consistently prior   Prior Level of Function Financial Management independent   Financial Management Dependent     Mobility   Mobility Status Independent  but decr balance with environmental scanning per PT     Written Expression   Dominant Hand Right   Handwriting 100% legible  but decr alignment on line (name, address, and numbers)     Vision - History   Baseline Vision No visual deficits     Vision Assessment   Eye Alignment Within Functional Limits  appears    Vision Assessment Vision impaired  _ to be further tested in functional context   Ocular Range  of Motion Within Functional Limits   Tracking/Visual Pursuits Able to track stimulus in all quads without difficulty  minor inconsistency--appears to be attention related   Saccades Within functional limits   Diplopia Assessment --  denies   Depth Perception pt demo difficulty writing on lines, placing objects in targets   Comment Pt demo difficulty reading 32M phone numbers correctly with missing numbers particularly on R side and difficulty with similiar-looking numbers (3/8)  able to read simple large print word accurately (last name)     Cognition   Overall Cognitive Status Impaired/Different from baseline   Area of Impairment Orientation;Attention;Memory;Following commands;Awareness;Problem solving   Orientation Level Person   General Comments see ST eval   Current Attention Level Sustained  impaired in quiet environment   Memory Decreased recall of precautions;Decreased short-term memory   Following Commands Follows one step commands  consistently  followed 1-2 step directs, difficulty with mutiple step    Awareness Intellectual   Problem Solving Slow processing;Decreased initiation;Requires verbal cues   Executive Function Initiating;Sequencing;Organizing  decr initiation of speech, ADLs--needs prompts   Initiating --  needed cueing to answer verbally vs. nonverbal communication   Behaviors Impulsive;Restless  stood during session, grabbed grandmother purse, flat affect     Observation/Other Assessments   Observations decr core stability, compensates with trunk with UE resistance     Sensation   Light Touch Not tested  pt denies numbness/tingling     Coordination   Fine Motor Movements are Fluid and Coordinated No   Coordination and Movement Description decr finger opposition (incr time, decr accuracy, particularly R hand)   9 Hole Peg Test Right;Left   Right 9 Hole Peg Test 43.19   Left 9 Hole Peg Test 59.66     AROM   Overall AROM  Within functional limits for tasks performed  BUEs      Strength   Overall Strength Deficits   Overall Strength Comments Grossly WNL BUE proximal strength, but demo decr core stability     Hand Function   Right Hand Grip (lbs) 46   Left Hand Grip (lbs) 51                         OT Education - 07/31/17 1356    Education provided Yes   Education Details OT eval results/POC; ways to incr pt independence (picking out his clothes, folding clothes, getting himself something to drink with supervision/cueing prn); education regarding ways to cue pt (using least as possible to encourage problem solving); discussed insurance considerations and enphasized importance of notifying front desk with insurance info next session and to call insurance company with insurance questions; discussed importance of caregiver involvement for incr carryover/progress due to cognitive deficits   Person(s) Educated Secretary/administrator)  grandmother   Methods Explanation    Comprehension Verbalized understanding          OT Short Term Goals - 07/31/17 2057      OT SHORT TERM GOAL #1   Title Pt/caregiver will be independent with initial HEP/functional tasks for home.--check STGs 09/07/17   Baseline dependent   Time 5   Period Weeks   Status New     OT SHORT TERM GOAL #2   Title Pt will perform UB dressing consistently without cueing.   Baseline needs cueing/prompts   Time 5   Period Weeks   Status New     OT SHORT TERM GOAL #3   Title Pt will be able to  choose/retrieve appropriate clothing without verbal prompts.   Baseline not performing/set up currently   Time 5   Period Weeks   Status New     OT SHORT TERM GOAL #4   Title Pt will be able to fold laundry with set-up.   Baseline dependent/not performing   Time 5   Period Weeks   Status New     OT SHORT TERM GOAL #5   Title Pt/caregiver will verbalize understanding of cognitive compensation strategies for ADLs/functional tasks to incr participation in ADLs.   Baseline dependent   Time 5   Period Weeks   Status New           OT Long Term Goals - 07/31/17 2113      OT LONG TERM GOAL #1   Title Pt/caregiver will be independent with updated HEP/functional tasks for home.-- check LTGs 10/12/17   Baseline dependent   Time 10   Period Weeks   Status New     OT LONG TERM GOAL #2   Title Pt will perform bathing/dressing without cueing (distant supervision).   Baseline min A, and prompts   Time 10   Period Weeks   Status New     OT LONG TERM GOAL #3   Title Pt will follow simple schedule/routine for ADLs with min cues.   Baseline pt needs cueing, assistance for initiation and throughout ADLs, decr attention/ability to follow directions   Time 10   Period Weeks   Status New     OT LONG TERM GOAL #4   Title Pt will perform simple snack prep with min cueing/supervision.   Baseline not performing/dependent   Time 10   Period Weeks   Status New     OT LONG TERM GOAL #5    Title Pt will perform simple home maintenance task with supervision/min cueing.   Baseline not performing/dependent   Time 10   Period Weeks   Status New               Plan - 07/31/17 2135    Clinical Impression Statement Pt presents today with significant cognitive deficits, visuospatial deficits, decr coordination, decr strength/activity tolerance, decr balance affecting ADL/IADL performance.  Pt would benefit from occupational thearpy to incr independence/participation with ADLs/IADLs to improve quality of life and decr caregiver burden.     Occupational Profile and client history currently impacting functional performance Pt is a 26 y.o. 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  Pt also s/p VP shunt placement.  Pt then received home health therapies.  Pt was independent and working full time delivering pizza prior to hospitalization.  Pt was living with grandmother.  Pt currently lives with mother/sisters (rotates with different family on different days), but would like to return to living with grandmother.  Pt currently is dependent for IADLs and needs assistance for BADLs.  Pt with complex PMH that includes:  AIDS, Hep B, hx of syphilis, bipolar disorder, seizure hx, anemia, urinary catheter, staphylococcus epidermidis, ventriculitis, hypotension, renal insuffiency, hx of substance abuse.   Occupational performance deficits (Please refer to evaluation for details): Work;Leisure;Social Participation;IADL's;ADL's   Rehab Potential Fair   Current Impairments/barriers affecting progress: cognitive deficits, urinary catheter, severity of deficits   OT Frequency 2x / week   OT Duration 10 weeks  +evaluation (or 21 visits).  However, pt's family reports that it is anticipated that pt will receive Medicaid.  If/when pt is approved for Medicaid, frequency will  be adjusted to 2x  Week for 6 weeks due to  Medicaid visit limitations.    OT Treatment/Interventions Self-care/ADL training;Therapeutic exercise;DME and/or AE instruction;Building services engineerunctional Mobility Training;Therapeutic activities;Patient/family education;Cognitive remediation/compensation;Neuromuscular education;Visual/perceptual remediation/compensation;Therapeutic exercises   Plan simple functional tasks (folding laundry, sorting objects, visual scanning), strategies for ADLs/discuss routine and cognitive tips for ADLs   Clinical Decision Making Multiple treatment options, significant modification of task necessary   Consulted and Agree with Plan of Care Patient;Family member/caregiver   Family Member Consulted grandmother--Sue      Patient will benefit from skilled therapeutic intervention in order to improve the following deficits and impairments:  Decreased balance, Impaired vision/preception, Decreased cognition, Decreased knowledge of precautions, Impaired perceived functional ability, Impaired UE functional use, Decreased strength, Decreased safety awareness, Decreased knowledge of use of DME, Decreased coordination, Decreased activity tolerance  Visit Diagnosis: Attention and concentration deficit  Frontal lobe and executive function deficit  Unsteadiness on feet  Visuospatial deficit  Other lack of coordination  Muscle weakness (generalized)    Problem List Patient Active Problem List   Diagnosis Date Noted  . Urinary retention 07/12/2017  . Acute renal insufficiency 07/12/2017  . Acute cystitis without hematuria   . Hypotension   . 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 Acute urinary retention 06/26/2017  . Staphylococcus epidermidis ventriculitis 06/26/2017  . Bacteremia due to Streptococcus pneumoniae 06/26/2017  . Bordetella pertussis 06/26/2017  . 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 Transaminitis   . Endotracheal tube present   . Status epilepticus (HCC)    . History of syphilis   . Tobacco abuse   . Meningoencephalitis   . 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 Acute respiratory failure (HCC)   . Seizure (HCC) 05/20/2017  . Peri-rectal abscess 03/06/2017  . 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 Anemia 09/12/2016  . 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 Bipolar disorder (HCC) 04/08/2013  . Rash and nonspecific skin eruption 10/10/2012    [facility] HospitalFREEMAN,ANGELA 07/31/2017, 9:52 PM  [facility] [address] [place], [place], [address] Phone: [phone]   Fax:  [phone]  Name: Ola SpurrLinston R Mercer MRN: [id number] Date of Birth: 01/28/1991   Willa FraterAngela Freeman, OTR/L [facility] [address] [place], [place]  [address] [phone] phone [phone] 07/31/17 9:52 PM

## 2017-08-01 NOTE — Therapy (Addendum)
[facility] [address] [place], [name], [address] Phone: [phone]   Fax:  [phone]  Speech Language Pathology Evaluation  Patient Details  Name: Justin Ball MRN: [id number] Date of Birth: 1991-04-20  Referring Provider; Geoffry Paradise, MD  Encounter Date: 07/31/2017    Past Medical History:  Diagnosis Date  . Acute renal insufficiency 07/12/2017  . Anxiety   . Bipolar 1 disorder (HCC)   . Depression   . HIV infection (HCC)   . Neuromuscular disorder (HCC)   . Scabies   . 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 Substance abuse   . Syphilis     Past Surgical History:  Procedure Laterality Date  . EXAMINATION UNDER ANESTHESIA N/A 12/28/2014   Procedure: EXAM UNDER ANESTHESIA;  Surgeon: Karie Soda, MD;  [facility];  Service: General;  Laterality: N/A;  . FLOOR OF MOUTH BIOPSY N/A 09/16/2016   Procedure: BIOPSY OF ORAL ABCESS;  Surgeon: Newman Pies, MD;  [facility];  Service: ENT;  Laterality: N/A;  . INCISION AND DRAINAGE ABSCESS N/A 09/16/2016   Procedure: INCISION AND DRAINAGE ABSCESS;  Surgeon: Newman Pies, MD;  [facility];  Service: ENT;  Laterality: N/A;  . INCISION AND DRAINAGE PERIRECTAL ABSCESS  08/27/2010   Dr Carolynne Edouard  . INCISION AND DRAINAGE PERIRECTAL ABSCESS N/A 12/28/2014   Procedure: IRRIGATION AND DEBRIDEMENT PERIRECTAL ABSCESS;  Surgeon: Karie Soda, MD;  [facility];  Service: General;  Laterality: N/A;  Fistula repair and ablation  . LAPAROSCOPIC REVISION VENTRICULAR-PERITONEAL (V-P) SHUNT N/A 06/12/2017   Procedure: LAPAROSCOPIC REVISION VENTRICULAR-PERITONEAL (V-P) SHUNT;  Surgeon: Kinsinger, De Blanch, MD;  [facility]: [facility];  Service: General;  Laterality: N/A;  . VENTRICULOPERITONEAL SHUNT N/A 06/12/2017   Procedure: SHUNT INSERTION VENTRICULAR-PERITONEAL;  Surgeon: [name], MD;  [facility]: [facility];  Service: Neurosurgery;  Laterality: N/A;    There were no vitals filed for  this visit.                             SLP Short Term Goals - 08/28/17 1644      SLP SHORT TERM GOAL #1   Title  Baseline pt will demo sustained attention for 5 minutes, in 3 different tasks completed 90% success, in 3 sessions approx one minute   Time 3   Period Weeks   Status On-going     SLP SHORT TERM GOAL #2   Title  Baseline pt will tell SLP 2 cognitive defict areas with min A occasionallly, in three sessions No intellectual awareness   Time 3   Period Weeks   Status On-going     SLP SHORT TERM GOAL #3   Title  Baseline pt will demo orientation to year, month, date with modified independence (calendar) Oriented to month after mod cues to look at calendar   Time 3   Period Weeks   Status On-going     SLP SHORT TERM GOAL #4   Title  Baseline pt will demo sustained attention to one task for 10 minutes to achieve 90% success Approx one minute   Time 3   Period Weeks   Status On-going          SLP Long Term Goals - 08/28/17 1645      SLP LONG TERM GOAL #1   Title   Baseline pt will demonstrate sustained attention to one task for 15 mintues in order to achieve 90% success with  rare nonverbal cues back to task, in two sessions Approx one minute   Time 8   Period Weeks  or 20 visits   Status On-going     SLP LONG TERM GOAL #2   Title   Baseline pt will demo emergent awareness in a simple paper/pencil cognitive linguistic task 60% of the time given pt double checking responses No intellectual/emergent awareness   Time 8   Period Weeks  or 20 visits   Status On-going     SLP LONG TERM GOAL #3   Title  Baseline pt will demo orientation to month, day, date with modified independence over 8 sessions Month only after cues to look at calendar   Time 8   Period Weeks  or 20 visits   Status On-going        Patient will benefit from skilled therapeutic intervention in order to improve the following deficits and  impairments:   Cognitive communication deficit - Plan: SLP plan of care cert/re-cert, CANCELED: SLP plan of care cert/re-cert    Problem List Patient Active Problem List   Diagnosis Date Noted  . Urinary retention 07/12/2017  . Acute renal insufficiency 07/12/2017  . Acute cystitis without hematuria   . Hypotension   . 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 Acute urinary retention 06/26/2017  . Staphylococcus epidermidis ventriculitis 06/26/2017  . Bacteremia due to Streptococcus pneumoniae 06/26/2017  . Bordetella pertussis 06/26/2017  . 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 Transaminitis   . Endotracheal tube present   . Status epilepticus (HCC)   . History of syphilis   . Tobacco abuse   . Meningoencephalitis   . 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 Acute respiratory failure (HCC)   . Seizure (HCC) 05/20/2017  . Peri-rectal abscess 03/06/2017  . 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 Anemia 09/12/2016  . 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 Bipolar disorder (HCC) 04/08/2013  . Rash and nonspecific skin eruption 10/10/2012    [facility] Hospital[facility]INKE,Avaleen Brownley ,MS, CCC-SLP  08/30/2017, 2:46 PM  [facility] [address] [place], [place], [address] Phone: [phone]   Fax:  [phone]  Name: Justin Ball MRN: [id number] Date of Birth: 03/02/1991

## 2017-08-02 ENCOUNTER — Encounter: Payer: Self-pay | Admitting: Internal Medicine

## 2017-08-02 ENCOUNTER — Ambulatory Visit (INDEPENDENT_AMBULATORY_CARE_PROVIDER_SITE_OTHER): Payer: No Typology Code available for payment source | Admitting: Internal Medicine

## 2017-08-02 VITALS — BP 98/64 | HR 108 | Temp 98.2°F | Ht 73.0 in | Wt 145.0 lb

## 2017-08-02 DIAGNOSIS — B181 Chronic viral hepatitis B without delta-agent: Secondary | ICD-10-CM | POA: Diagnosis not present

## 2017-08-02 DIAGNOSIS — B20 Human immunodeficiency virus [HIV] disease: Secondary | ICD-10-CM | POA: Diagnosis not present

## 2017-08-02 DIAGNOSIS — B451 Cerebral cryptococcosis: Secondary | ICD-10-CM | POA: Diagnosis not present

## 2017-08-02 DIAGNOSIS — R4189 Other symptoms and signs involving cognitive functions and awareness: Secondary | ICD-10-CM

## 2017-08-02 LAB — CBC WITH DIFFERENTIAL/PLATELET
BASOS PCT: 2 %
Basophils Absolute: 46 cells/uL (ref 0–200)
EOS ABS: 69 {cells}/uL (ref 15–500)
EOS PCT: 3 %
HCT: 33 % — ABNORMAL LOW (ref 38.5–50.0)
HEMOGLOBIN: 11 g/dL — AB (ref 13.2–17.1)
LYMPHS ABS: 874 {cells}/uL (ref 850–3900)
Lymphocytes Relative: 38 %
MCH: 29.2 pg (ref 27.0–33.0)
MCHC: 33.3 g/dL (ref 32.0–36.0)
MCV: 87.5 fL (ref 80.0–100.0)
MONOS PCT: 6 %
MPV: 9.7 fL (ref 7.5–12.5)
Monocytes Absolute: 138 cells/uL — ABNORMAL LOW (ref 200–950)
NEUTROS ABS: 1173 {cells}/uL — AB (ref 1500–7800)
Neutrophils Relative %: 51 %
PLATELETS: 307 10*3/uL (ref 140–400)
RBC: 3.77 MIL/uL — ABNORMAL LOW (ref 4.20–5.80)
RDW: 14.7 % (ref 11.0–15.0)
WBC: 2.3 10*3/uL — ABNORMAL LOW (ref 3.8–10.8)

## 2017-08-02 NOTE — Progress Notes (Signed)
RFV: follow up for hiv disease, CM Patient ID: Justin Ball, male   DOB: 08/13/1991, 26 y.o.   MRN: [id number]  HPI Justin Ball is a 26yo M with HIV disease-chronic hepatitis B co infection who was off of his ART and presented to the ED with seizures in May. 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 He had prolonged hospitalization including significant neurologic insult s/p VP shunt. He is now on maintenance antifungal with fluconazole 400mg  daily which his mother administers his medication to him. He is starting cognitive/PT/OT rehab roughly twice per week which recently started last week. He is able to recall facts from 2 days ago.(which is an improvement from last visit)  Since his last visit in clinic - has had several urinary retention issues now with foley catheter, going to follow up with urology in 1 month to evaluate if ok without foley. Vs suprapubic - once to twice a week rehab.   He is here with his partner, Justin Ball  Outpatient Encounter Prescriptions as of 08/02/2017  Medication Sig  . acetaminophen (TYLENOL) 325 MG tablet Take 2 tablets (650 mg total) by mouth every 4 (four) hours as needed for mild pain (temp > 101.5).  Marland Kitchen. azithromycin (ZITHROMAX) 600 MG tablet Take 2 tablets (1,200 mg total) by mouth once a week.  . ciprofloxacin (CIPRO) 500 MG tablet Take 1 tablet (500 mg total) by mouth every 12 (twelve) hours.  . dolutegravir (TIVICAY) 50 MG tablet Take 1 tablet (50 mg total) by mouth daily.  Marland Kitchen. emtricitabine-rilpivir-tenofovir AF (ODEFSEY) 200-25-25 MG TABS tablet Take 1 tablet by mouth daily with breakfast.  . feeding supplement, ENSURE ENLIVE, (ENSURE ENLIVE) LIQD Take 237 mLs by mouth 3 (three) times daily between meals.  . fluconazole (DIFLUCAN) 200 MG tablet Take 2 tablets (400 mg total) by mouth daily.  Marland Kitchen. levETIRAcetam (KEPPRA) 1000 MG tablet Take 1 tablet (1,000 mg total) by mouth  2 (two) times daily.  . QUEtiapine (SEROQUEL) 25 MG tablet Take 1 tablet (25 mg total) by mouth 2 (two) times daily as needed (agitation).  Marland Kitchen. sulfamethoxazole-trimethoprim (BACTRIM,SEPTRA) 400-80 MG tablet Take 1 tablet by mouth 3 (three) times a week.  . Water For Irrigation, Sterile (FREE WATER) SOLN Take 400 mLs by mouth every 8 (eight) hours.   No facility-administered encounter medications on file as of 08/02/2017.      Patient Active Problem List   Diagnosis Date Noted  . Urinary retention 07/12/2017  . Acute renal insufficiency 07/12/2017  . Acute cystitis without hematuria   . Hypotension   . 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 Acute urinary retention 06/26/2017  . Staphylococcus epidermidis ventriculitis 06/26/2017  . Bacteremia due to Streptococcus pneumoniae 06/26/2017  . Bordetella pertussis 06/26/2017  . 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 Transaminitis   . Endotracheal tube present   . Status epilepticus (HCC)   . History of syphilis   . Tobacco abuse   . Meningoencephalitis   . 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 Acute respiratory failure (HCC)   . Seizure (HCC) 05/20/2017  . Peri-rectal abscess 03/06/2017  . 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 Anemia 09/12/2016  . 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 Bipolar disorder (HCC) 04/08/2013  . Rash and nonspecific skin eruption 10/10/2012     Health Maintenance Due  Topic Date Due  . URINE MICROALBUMIN  02/27/2001  . TETANUS/TDAP  02/27/2010  . INFLUENZA VACCINE  07/25/2017     Review of Systems + difficulty with memory. Needs assistance with adl and EF tasks Physical Exam   BP 98/64   Pulse (!) 108   Temp 98.2 F (36.8 C) (Oral)   Ht 6\' 1"  (1.854 m)   Wt 145 lb (65.8 kg)   BMI 19.13 kg/m   Physical Exam  Constitutional: He is oriented to person, place, only. He appears well-developed and  well-nourished. No distress.  HENT:  Mouth/Throat: Oropharynx is clear and moist. No oropharyngeal exudate.  Cardiovascular: Normal rate, regular rhythm and normal heart sounds. Exam reveals no gallop and no friction rub.  No murmur heard.  Pulmonary/Chest: Effort normal and breath sounds normal. No respiratory distress. He has no wheezes.  Abdominal: Soft. Bowel sounds are normal. He exhibits no distension. There is no tenderness.  Lymphadenopathy:  He has no cervical adenopathy.  Neurological: He is alert and oriented to person, place, only.  Skin: Skin is warm and dry. No rash noted. No erythema.  Psychiatric: He has a normal mood and affect. His behavior is normal.    Lab Results  Component Value Date   CD4TCELL  07/10/2017    THIS TEST WAS RESULTED IN ERROR. PLEASE DISREGARD RESULTS.   CD4TCELL 5 (L) 07/10/2017   Lab Results  Component Value Date   CD4TABS  07/10/2017    THIS TEST WAS RESULTED IN ERROR. PLEASE DISREGARD RESULTS.   CD4TABS 30 (L) 07/10/2017   CD4TABS 10 (L) 04/18/2017   Lab Results  Component Value Date   HIV1RNAQUANT 588 (H) 07/10/2017   Lab Results  Component Value Date   HEPBSAB Non Reactive 03/07/2017   Lab Results  Component Value Date   LABRPR NON REAC 04/18/2017    CBC Lab Results  Component Value Date   WBC 2.2 (L) 07/12/2017   RBC 3.22 (L) 07/12/2017   HGB 9.3 (L) 07/12/2017   HCT 27.0 (L) 07/12/2017   PLT 198 07/12/2017   MCV 83.9 07/12/2017   MCH 28.9 07/12/2017   MCHC 34.4 07/12/2017   RDW 13.5 07/12/2017   LYMPHSABS 0.7 07/12/2017   MONOABS 0.2 07/12/2017   EOSABS 0.2 07/12/2017    BMET Lab Results  Component Value Date   NA 135 07/12/2017   K 3.2 (L) 07/12/2017   CL 109 07/12/2017   CO2 20 (L) 07/12/2017   GLUCOSE 79 07/12/2017   BUN 22 (H) 07/12/2017   CREATININE 1.86 (H) 07/12/2017   CALCIUM 8.9 07/12/2017   GFRNONAA 48 (L) 07/12/2017   GFRAA 56 (L) 07/12/2017      Assessment and Plan HIV disease= his VL  is trending down but we will recheck to see if it is nearing being undetectable - continue with meds  Chronic hep b flare = detectable VL at last visit. We will recheck his labs  CKD 3 = will check Cr function to see it is improved since he previously did not have any aki  CM = continue on fluconazole 400mg  daily. Continue with rehab  - rtc in 4 wk

## 2017-08-03 ENCOUNTER — Ambulatory Visit: Payer: No Typology Code available for payment source | Admitting: Physical Therapy

## 2017-08-03 ENCOUNTER — Encounter: Payer: No Typology Code available for payment source | Admitting: Occupational Therapy

## 2017-08-03 LAB — COMPLETE METABOLIC PANEL WITH GFR
ALT: 46 U/L (ref 9–46)
AST: 48 U/L — ABNORMAL HIGH (ref 10–40)
Albumin: 3.6 g/dL (ref 3.6–5.1)
Alkaline Phosphatase: 165 U/L — ABNORMAL HIGH (ref 40–115)
BUN: 17 mg/dL (ref 7–25)
CHLORIDE: 109 mmol/L (ref 98–110)
CO2: 17 mmol/L — AB (ref 20–32)
Calcium: 9 mg/dL (ref 8.6–10.3)
Creat: 1.14 mg/dL (ref 0.60–1.35)
GFR, EST NON AFRICAN AMERICAN: 88 mL/min (ref >=60)
Glucose, Bld: 62 mg/dL — ABNORMAL LOW (ref 65–99)
POTASSIUM: 3.7 mmol/L (ref 3.5–5.3)
Sodium: 139 mmol/L (ref 135–146)
Total Bilirubin: 0.3 mg/dL (ref 0.2–1.2)
Total Protein: 8.1 g/dL (ref 6.1–8.1)

## 2017-08-03 LAB — RPR

## 2017-08-03 LAB — T-HELPER CELL (CD4) - (RCID CLINIC ONLY)
CD4 % Helper T Cell: 6 % — ABNORMAL LOW (ref 33–55)
CD4 T Cell Abs: 50 /uL — ABNORMAL LOW (ref 400–2700)

## 2017-08-03 NOTE — Addendum Note (Signed)
Addended by: Verdie MosherSCHINKE, CARL B on: 08/03/2017 04:24 PM   Modules accepted: Orders

## 2017-08-04 LAB — HEPATITIS B DNA, ULTRAQUANTITATIVE, PCR
HEPATITIS B DNA (CALC): 5.45 {Log_IU}/mL — AB
HEPATITIS B DNA: 282000 [IU]/mL — AB

## 2017-08-06 LAB — HIV-1 RNA QUANT-NO REFLEX-BLD
HIV 1 RNA Quant: 66 copies/mL — ABNORMAL HIGH
HIV-1 RNA QUANT, LOG: 1.82 {Log_copies}/mL — AB

## 2017-08-07 ENCOUNTER — Ambulatory Visit: Payer: No Typology Code available for payment source

## 2017-08-07 ENCOUNTER — Ambulatory Visit: Payer: No Typology Code available for payment source | Admitting: Occupational Therapy

## 2017-08-07 DIAGNOSIS — R41842 Visuospatial deficit: Secondary | ICD-10-CM

## 2017-08-07 DIAGNOSIS — R41840 Attention and concentration deficit: Secondary | ICD-10-CM

## 2017-08-07 DIAGNOSIS — R41841 Cognitive communication deficit: Secondary | ICD-10-CM

## 2017-08-07 DIAGNOSIS — R41844 Frontal lobe and executive function deficit: Secondary | ICD-10-CM

## 2017-08-07 DIAGNOSIS — R2681 Unsteadiness on feet: Secondary | ICD-10-CM

## 2017-08-07 DIAGNOSIS — R278 Other lack of coordination: Secondary | ICD-10-CM

## 2017-08-07 NOTE — Patient Instructions (Signed)
  1.  Routine helps memory (wake up about the same time, take medication about the same time, eat at about the same time, etc.)  2.  Use a simple schedule  (example):        1.  Wake up   2.  Clean your face   3.  Change Depends/empty catheter   4.  Get dressed   5.  Eat breakfast   6.  Take medicine  3.  Talk about what you did each day.  Ask questions.  4.  Try making sandwich or simple breakfast (nothing on stovetop yet).

## 2017-08-07 NOTE — Therapy (Signed)
[facility] [address] [place], [name], [address] Phone: [phone]   Fax:  [phone]  Occupational Therapy Treatment  Patient Details  Name: SENAN UREY MRN: [id number] Date of Birth: [birth year] Referring Provider: Dr. Geoffry Paradise  Encounter Date: 08/07/2017      OT End of Session - 08/07/17 1749    Visit Number 2   Number of Visits 17   Date for OT Re-Evaluation 09/29/17   Authorization Type [employer]--awaiting insurance verification; (Medicaid pending, also applied for Medicare)   OT Start Time 1450   OT Stop Time 1535   OT Time Calculation (min) 45 min   Activity Tolerance Patient tolerated treatment well   Behavior During Therapy [facility] for tasks assessed/performed      Past Medical History:  Diagnosis Date  . Acute renal insufficiency 07/12/2017  . Anxiety   . Bipolar 1 disorder (HCC)   . Depression   . HIV infection (HCC)   . Neuromuscular disorder (HCC)   . Scabies   . 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 Substance abuse   . Syphilis     Past Surgical History:  Procedure Laterality Date  . EXAMINATION UNDER ANESTHESIA N/A 12/28/2014   Procedure: EXAM UNDER ANESTHESIA;  Surgeon: Karie Soda, MD;  Location: [facility];  Service: General;  Laterality: N/A;  . FLOOR OF MOUTH BIOPSY N/A 09/16/2016   Procedure: BIOPSY OF ORAL ABCESS;  Surgeon: Newman Pies, MD;  Location: [facility] OR;  Service: ENT;  Laterality: N/A;  . INCISION AND DRAINAGE ABSCESS N/A 09/16/2016   Procedure: INCISION AND DRAINAGE ABSCESS;  Surgeon: Newman Pies, MD;  Location: [facility] OR;  Service: ENT;  Laterality: N/A;  . INCISION AND DRAINAGE PERIRECTAL ABSCESS  08/27/2010   Dr Carolynne Edouard  . INCISION AND DRAINAGE PERIRECTAL ABSCESS N/A 12/28/2014   Procedure: IRRIGATION AND DEBRIDEMENT PERIRECTAL ABSCESS;  Surgeon: Karie Soda, MD;  Location: [facility];  Service: General;  Laterality: N/A;  Fistula repair and ablation  . LAPAROSCOPIC REVISION VENTRICULAR-PERITONEAL  (V-P) SHUNT N/A 06/12/2017   Procedure: LAPAROSCOPIC REVISION VENTRICULAR-PERITONEAL (V-P) SHUNT;  Surgeon: Kinsinger, De Blanch, MD;  Location: [facility] OR;  Service: General;  Laterality: N/A;  . VENTRICULOPERITONEAL SHUNT N/A 06/12/2017   Procedure: SHUNT INSERTION VENTRICULAR-PERITONEAL;  Surgeon: [name], MD;  Location: [facility] OR;  Service: Neurosurgery;  Laterality: N/A;    There were no vitals filed for this visit.      Subjective Assessment - 08/07/17 1747    Subjective  Pt/grandma reports that pt warmed some chicken in the microwave, picked out his clothes, and has been trying to fold laundry   Patient is accompained by: --  [name]--Sue   Pertinent History Hospitalized 5/27-06/27/17 with cryptococcal meningitis/encephalitis/neurosyphillis and VP shunt placement; AIDS, Hep B, hx of syphilis, bipolar disorder, seizure hx, anemia, urinary catheter, staphylococcus epidermidis, ventriculitis, hypotension, renal insuffiency, hx of substance abuse   Limitations AIDS, Hep B, hx of syphilis, seizure hx, s/p VP shunt, urinary catheter, cognitive deficits   Patient Stated Goals be able to take care of myself   Currently in Pain? No/denies   Pain Onset More than a month ago      Discussed insurance visit limitations and options to spread out visits including reducing to 1x/week and decr number of PT visits as family is most concerned about cognitive deficits.  Grandmother reports that pt may be approved for Medicaid next month and that he will likely no longer have [employer] if Medicaid is approved.  Informed grandmother that  therapy can only submit to Medicaid once card has been received and that Medicaid requires prior authorization and has visit limits and that it is unsure how many visits that pt will receive until it approved.  Pt/family does not wish to change current schedule/frequency at this time, but will discuss further.    Discussed progress with recommendations from last session on ways  to incr participation in ADLs/simple IADLs.    Educated Hospital doctor on importance of use of routine and simple written schedule to incr initiation and help with organization (recommended starting with approx 5 simple tasks and then add if pt does well) due to cognitive deficits.  Also recommended pt begin simple cleaning tasks (wiping counter, dusting, etc) with supervision/cueing prn.  Also recommended consistent environment due to cognitive deficits.  Pt/caregiver verbalized understanding.  Asked pt basic questions regarding routine and if what he could prepare himself for breakfast (cereal) or lunch (Romen noodles, banana, or after prompting-- peanut butter and jelly sandwich).  Recommended pt have to prepare simple meal like this at least a couple times a week, increasing as able.  Pt/caregiver verbalized understanding.  In standing, folding towels and wash cloths.  Pt able to fold wash clothes with min decr in accuracy, but mod decr in accuracy and cueing for folding towels.  Pt demo difficulty and unable to fold long sleeve shirt despite cueing.                          OT Education - 08/07/17 1748    Education Details Cognitive compensation strategies for ADLs--see pt instructions   Person(s) Educated Patient;Caregiver(s)   Methods Explanation   Comprehension Verbalized understanding          OT Short Term Goals - 07/31/17 2057      OT SHORT TERM GOAL #1   Title Pt/caregiver will be independent with initial HEP/functional tasks for home.--check STGs    Baseline dependent   Time 4   Period Weeks   Status New     OT SHORT TERM GOAL #2   Title Pt will perform UB dressing consistently without cueing.   Baseline needs cueing/prompts   Time 4   Period Weeks   Status New     OT SHORT TERM GOAL #3   Title Pt will be able to choose/retrieve appropriate clothing without verbal prompts.   Baseline not performing/set up currently   Time 4   Period Weeks   Status  New     OT SHORT TERM GOAL #4   Title Pt will be able to fold laundry with set-up.   Baseline dependent/not performing   Time 4   Period Weeks   Status New     OT SHORT TERM GOAL #5   Title Pt/caregiver will verbalize understanding of cognitive compensation strategies for ADLs/functional tasks to incr participation in ADLs.   Baseline dependent   Time 4   Period Weeks   Status New           OT Long Term Goals - 07/31/17 2113      OT LONG TERM GOAL #1   Title Pt/caregiver will be independent with updated HEP/functional tasks for home.-- check LTGs 09/29/17   Baseline dependent   Time 8   Period Weeks   Status New     OT LONG TERM GOAL #2   Title Pt will perform bathing/dressing without cueing (distant supervision).   Baseline min A, and prompts   Time  8   Period Weeks   Status New     OT LONG TERM GOAL #3   Title Pt will follow simple schedule/routine for ADLs with min cues.   Baseline pt needs cueing, assistance for initiation and throughout ADLs, decr attention/ability to follow directions   Time 8   Period Weeks   Status New     OT LONG TERM GOAL #4   Title Pt will perform simple snack prep with min cueing/supervision.   Baseline not performing/dependent   Time 8   Period Weeks   Status New     OT LONG TERM GOAL #5   Title Pt will perform simple home maintenance task with supervision/min cueing.   Baseline not performing/dependent   Time 8   Period Weeks   Status New               Plan - 08/07/17 1750    Clinical Impression Statement Pt engaged in conversation more today and pt/grandmother reports incr ADL/IADL participation per recommendations last session.   Rehab Potential Fair   Current Impairments/barriers affecting progress: cognitive deficits, urinary catheter, severity of deficits   OT Frequency 2x / week   OT Duration 8 weeks  +evaluation   OT Treatment/Interventions Self-care/ADL training;Therapeutic exercise;DME and/or AE  instruction;Building services engineerunctional Mobility Training;Therapeutic activities;Patient/family education;Cognitive remediation/compensation;Neuromuscular education;Visual/perceptual remediation/compensation;Therapeutic exercises   Plan simple functional tasks (visual scanning, making sandwich, donning shirt, folding towels/t-shirt using strategies prn, etc), continue wih cognitive tips for ADLs   Consulted and Agree with Plan of Care Patient;Family member/caregiver   Family Member Consulted grandmother--Sue      Patient will benefit from skilled therapeutic intervention in order to improve the following deficits and impairments:  Decreased balance, Impaired vision/preception, Decreased cognition, Decreased knowledge of precautions, Impaired perceived functional ability, Impaired UE functional use, Decreased strength, Decreased safety awareness, Decreased knowledge of use of DME, Decreased coordination, Decreased activity tolerance  Visit Diagnosis: Attention and concentration deficit  Frontal lobe and executive function deficit  Unsteadiness on feet  Visuospatial deficit  Other lack of coordination    Problem List Patient Active Problem List   Diagnosis Date Noted  . Urinary retention 07/12/2017  . Acute renal insufficiency 07/12/2017  . Acute cystitis without hematuria   . Hypotension   . 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 Acute urinary retention 06/26/2017  . Staphylococcus epidermidis ventriculitis 06/26/2017  . Bacteremia due to Streptococcus pneumoniae 06/26/2017  . Bordetella pertussis 06/26/2017  . 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 Transaminitis   . Endotracheal tube present   . Status epilepticus (HCC)   . History of syphilis   . Tobacco abuse   . Meningoencephalitis   . 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 Acute respiratory failure (HCC)   . Seizure (HCC) 05/20/2017  . Peri-rectal abscess 03/06/2017  . 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 Anemia 09/12/2016  . 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 Bipolar disorder (HCC) 04/08/2013  . Rash and nonspecific skin eruption 10/10/2012    Aur[facility] Med Ctr OshkoshFREEMAN,Devanny Palecek 08/07/2017, 7:49 PM  [facility] [address] [place], [place], [address] Phone: [phone]   Fax:  [phone]  Name: Ola SpurrLinston R Dundon MRN: [id number] Date of Birth: 07/29/1991   Willa FraterAngela Kamyia Thomason, OTR/L [facility] [address] [place], [place]  [address] [phone] phone [phone] 08/07/17 7:49 PM

## 2017-08-07 NOTE — Therapy (Signed)
[facility] [address] [place], [name], [address] Phone: [phone]   Fax:  [phone]  Speech Language Pathology Treatment  Patient Details  Name: Justin Ball MRN: [id number] Date of Birth: April 04, 1991 Referring Provider: [name], Remi Haggard, MD  Encounter Date: 08/07/2017      End of Session - 08/07/17 1649    Visit Number 2   Number of Visits 20   Date for SLP Re-Evaluation 10/19/17   Authorization Type [employer] - 25 visits combined - HARD LIMIT   Authorization - Visit Number 1   Authorization - Number of Visits 8   SLP Start Time 1535   SLP Stop Time  1616   SLP Time Calculation (min) 41 min   Activity Tolerance Patient tolerated treatment well      Past Medical History:  Diagnosis Date  . Acute renal insufficiency 07/12/2017  . Anxiety   . Bipolar 1 disorder (HCC)   . Depression   . HIV infection (HCC)   . Neuromuscular disorder (HCC)   . Scabies   . 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 Substance abuse   . Syphilis     Past Surgical History:  Procedure Laterality Date  . EXAMINATION UNDER ANESTHESIA N/A 12/28/2014   Procedure: EXAM UNDER ANESTHESIA;  Surgeon: Karie Soda, MD;  Location: [facility];  Service: General;  Laterality: N/A;  . FLOOR OF MOUTH BIOPSY N/A 09/16/2016   Procedure: BIOPSY OF ORAL ABCESS;  Surgeon: Newman Pies, MD;  [facility]: [facility];  Service: ENT;  Laterality: N/A;  . INCISION AND DRAINAGE ABSCESS N/A 09/16/2016   Procedure: INCISION AND DRAINAGE ABSCESS;  Surgeon: Newman Pies, MD;  [facility]: [facility];  Service: ENT;  Laterality: N/A;  . INCISION AND DRAINAGE PERIRECTAL ABSCESS  08/27/2010   Dr Carolynne Edouard  . INCISION AND DRAINAGE PERIRECTAL ABSCESS N/A 12/28/2014   Procedure: IRRIGATION AND DEBRIDEMENT PERIRECTAL ABSCESS;  Surgeon: Karie Soda, MD;  Location: [facility];  Service: General;  Laterality: N/A;  Fistula repair and ablation  . LAPAROSCOPIC REVISION VENTRICULAR-PERITONEAL (V-P) SHUNT N/A  06/12/2017   Procedure: LAPAROSCOPIC REVISION VENTRICULAR-PERITONEAL (V-P) SHUNT;  Surgeon: Kinsinger, De Blanch, MD;  [facility];  Service: General;  Laterality: N/A;  . VENTRICULOPERITONEAL SHUNT N/A 06/12/2017   Procedure: SHUNT INSERTION VENTRICULAR-PERITONEAL;  Surgeon: [name], MD;  [facility]: [facility];  Service: Neurosurgery;  Laterality: N/A;    There were no vitals filed for this visit.      Subjective Assessment - 08/07/17 1539    Subjective Pt arrived to ST room with direction to go to last door on rt.   Patient is accompained by: --  grandma   Currently in Pain? No/denies               ADULT SLP TREATMENT - 08/07/17 1556      General Information   Behavior/Cognition Cooperative;Pleasant mood;Lethargic;Distractible;Requires cueing;Decreased sustained attention     Treatment Provided   Treatment provided Cognitive-Linquistic     Cognitive-Linquistic Treatment   Treatment focused on Cognition;Patient/family/caregiver education   Skilled Treatment SLP led a discussion about need for continuity with a pt schedule/routine at ONE place and not between three homes. Stressed importance of continuity between individuals helping pt during the day. Also stressed keeping or holding to a schedule. SLP targeted attention to auditory stimuli  and simple reaosning in word inferences ("3 clues" task) and pt req'd max cues occasionally for more abstract clues ("hang, wall, artist"). SLP educated grandma how to scaffold cueing  for pt and not just provide the answer for him.     Assessment / Recommendations / Plan   Plan Continue with current plan of care     Progression Toward Goals   Progression toward goals Progressing toward goals          SLP Education - 08/07/17 1649    Education provided Yes   Education Details home tasks/routine/schedule, cueing heirarchy   Person(s) Educated Patient;Caregiver(s)   Methods Explanation;Demonstration   Comprehension  Verbalized understanding;Need further instruction          SLP Short Term Goals - 08/07/17 1651      SLP SHORT TERM GOAL #1   Title pt will demo sustained attention for 5 minutes, in 3 different tasks completed 90% success, in 3 sessions   Time 5   Period Weeks   Status On-going     SLP SHORT TERM GOAL #2   Title pt will tell SLP 2 cognitive defict areas in three sessions   Time 5   Period Weeks   Status On-going     SLP SHORT TERM GOAL #3   Title pt will demo orientation to year, month, date with modified independence (calendar)   Time 5   Period Weeks   Status On-going     SLP SHORT TERM GOAL #4   Title pt will demo sustained attention to one task for 10 minutes to achieve 90% success   Time 5   Period Weeks   Status On-going          SLP Long Term Goals - 08/07/17 1652      SLP LONG TERM GOAL #1   Title pt will demonstrate sustained attention to one task for 15 mintues in order to achieve 90% success with rare nonverbal cues back to task, in two sessions   Time 10   Period Weeks  or 20 visits   Status On-going     SLP LONG TERM GOAL #2   Title pt will demo emergent awareness in a simple paper/pencil cognitive linguistic task 60% of the time given pt double checking responses   Time 10   Period Weeks  or 20 visits   Status On-going     SLP LONG TERM GOAL #3   Title pt will demo orientation to month, day, date with modified independence over 8 sessions   Time 10   Period Weeks  or 20 visits   Status On-going          Plan - 08/07/17 1650    Clinical Impression Statement Pt with mod to severe attention deficits hindering other areas of cognitive linguistics incliuding auditory comprehension/prcoessing, awareness, orientation, memory, problem solving, organization. Pt would cont to benefit from skilled ST to improve cognitive linguistics and to lessen caregiver burden.   Speech Therapy Frequency 2x / week   Duration --  10 weeks or 20 visits    Treatment/Interventions Language facilitation;Compensatory techniques;Internal/external aids;SLP instruction and feedback;Cognitive reorganization;Environmental controls;Multimodal communcation approach;Functional tasks;Cueing hierarchy;Patient/family education   Potential to Achieve Goals Fair   Potential Considerations Severity of impairments;Cooperation/participation level;Family/community support;Ability to learn/carryover information  hx of noncompliance with meds - ? also with therapy recommendations?   SLP Home Exercise Plan home tasks provided today   Consulted and Agree with Plan of Care Patient;Family member/caregiver   Family Member Consulted grandmother      Patient will benefit from skilled therapeutic intervention in order to improve the following deficits and impairments:   Cognitive communication deficit  Problem List Patient Active Problem List   Diagnosis Date Noted  . Urinary retention 07/12/2017  . Acute renal insufficiency 07/12/2017  . Acute cystitis without hematuria   . Hypotension   . 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 Acute urinary retention 06/26/2017  . Staphylococcus epidermidis ventriculitis 06/26/2017  . Bacteremia due to Streptococcus pneumoniae 06/26/2017  . Bordetella pertussis 06/26/2017  . 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 Transaminitis   . Endotracheal tube present   . Status epilepticus (HCC)   . History of syphilis   . Tobacco abuse   . Meningoencephalitis   . 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 Acute respiratory failure (HCC)   . Seizure (HCC) 05/20/2017  . Peri-rectal abscess 03/06/2017  . 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 Anemia 09/12/2016  . 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 Bipolar disorder (HCC) 04/08/2013  . Rash and nonspecific skin eruption 10/10/2012    [facility] ,MS, CCC-SLP  08/07/2017, 4:53  PM  [facility] [address] [place], [name], [address] Phone: [phone]   Fax:  [phone]   Name: Justin Ball MRN: [id number] Date of Birth: 03-25-1991

## 2017-08-08 ENCOUNTER — Ambulatory Visit: Payer: No Typology Code available for payment source | Admitting: Occupational Therapy

## 2017-08-08 ENCOUNTER — Ambulatory Visit: Payer: No Typology Code available for payment source

## 2017-08-08 DIAGNOSIS — R41841 Cognitive communication deficit: Secondary | ICD-10-CM | POA: Diagnosis not present

## 2017-08-08 NOTE — Addendum Note (Signed)
Addended by: Fraser DinWALDRON, Kell Ferris M on: 08/08/2017 10:02 AM   Modules accepted: Orders

## 2017-08-08 NOTE — Patient Instructions (Signed)
  Please complete the assigned speech therapy homework prior to your next session and return it to the speech therapist at your next visit.  

## 2017-08-08 NOTE — Therapy (Signed)
[facility] [address] [place], [place], [address] Phone: [phone]   Fax:  [phone]  Speech Language Pathology Treatment  Patient Details  Name: Justin SpurrLinston R Saulters MRN: [id number] Date of Birth: 12/06/1991 Referring Provider: Daiva EvesVan Dam, Remi Haggardornelius, MD  Encounter Date: 08/08/2017      End of Session - 08/08/17 1737    Visit Number 3   Number of Visits 20   Date for SLP Re-Evaluation 10/19/17   Authorization Type [employer] - 25 visits combined - HARD LIMIT   Authorization - Visit Number 2   Authorization - Number of Visits 8   SLP Start Time 1450   SLP Stop Time  1530   SLP Time Calculation (min) 40 min   Activity Tolerance Patient tolerated treatment well      Past Medical History:  Diagnosis Date  . Acute renal insufficiency 07/12/2017  . Anxiety   . Bipolar 1 disorder (HCC)   . Depression   . HIV infection (HCC)   . Neuromuscular disorder (HCC)   . Scabies   . 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 Substance abuse   . Syphilis     Past Surgical History:  Procedure Laterality Date  . EXAMINATION UNDER ANESTHESIA N/A 12/28/2014   Procedure: EXAM UNDER ANESTHESIA;  Surgeon: Karie SodaSteven Gross, MD;  [facility]: [facility];  Service: General;  Laterality: N/A;  . FLOOR OF MOUTH BIOPSY N/A 09/16/2016   Procedure: BIOPSY OF ORAL ABCESS;  Surgeon: Newman PiesSu Teoh, MD;  [facility];  Service: ENT;  Laterality: N/A;  . INCISION AND DRAINAGE ABSCESS N/A 09/16/2016   Procedure: INCISION AND DRAINAGE ABSCESS;  Surgeon: Newman PiesSu Teoh, MD;  [facility];  Service: ENT;  Laterality: N/A;  . INCISION AND DRAINAGE PERIRECTAL ABSCESS  08/27/2010   Dr Carolynne Edouardoth  . INCISION AND DRAINAGE PERIRECTAL ABSCESS N/A 12/28/2014   Procedure: IRRIGATION AND DEBRIDEMENT PERIRECTAL ABSCESS;  Surgeon: Karie SodaSteven Gross, MD;  [facility]: [facility];  Service: General;  Laterality: N/A;  Fistula repair and ablation  . LAPAROSCOPIC REVISION VENTRICULAR-PERITONEAL (V-P) SHUNT N/A  06/12/2017   Procedure: LAPAROSCOPIC REVISION VENTRICULAR-PERITONEAL (V-P) SHUNT;  Surgeon: Kinsinger, De BlanchLuke Aaron, MD;  [facility]: [facility];  Service: General;  Laterality: N/A;  . VENTRICULOPERITONEAL SHUNT N/A 06/12/2017   Procedure: SHUNT INSERTION VENTRICULAR-PERITONEAL;  Surgeon: Lisbeth RenshawNundkumar, Neelesh, MD;  Location: [facility];  Service: Neurosurgery;  Laterality: N/A;    There were no vitals filed for this visit.      Subjective Assessment - 08/07/17 1539    Subjective Pt arrived to ST room with direction to go to last door on rt.   Patient is accompained by: --  grandma   Currently in Pain? No/denies               ADULT SLP TREATMENT - 08/08/17 1507      General Information   Behavior/Cognition Cooperative;Pleasant mood;Distractible;Decreased sustained attention     Treatment Provided   Treatment provided Cognitive-Linquistic     Cognitive-Linquistic Treatment   Treatment focused on Cognition;Patient/family/caregiver education   Skilled Treatment Discussed decr to once a week due to limited visits. Pt agreed, grandmother to reduce frequency to once/week beginning next week.  In simple sustained attention task (word ID in a 3 1/2 minute word string) pt was 100% successful given delayed ID 20% of the time. In simple selective attention task wiht min distractions (radio on [employer]), pt ID'd 90% of targets, with approx 25-30% with delayed ID. Pt able to hold 3 minute conversation  with rare repeats from SLP.  SLP led conversation, pt without questions/comments unless asked directly.     Assessment / Recommendations / Plan   Plan Continue with current plan of care     Progression Toward Goals   Progression toward goals Progressing toward goals          SLP Education - 08/07/17 1649    Education provided Yes   Education Details home tasks/routine/schedule, cueing heirarchy   Person(s) Educated Patient;Caregiver(s)   Methods Explanation;Demonstration   Comprehension Verbalized  understanding;Need further instruction          SLP Short Term Goals - 08/08/17 1737      SLP SHORT TERM GOAL #1   Title pt will demo sustained attention for 5 minutes, in 3 different tasks completed 90% success, in 3 sessions   Time 5   Period Weeks   Status On-going     SLP SHORT TERM GOAL #2   Title pt will tell SLP 2 cognitive defict areas with min A occasionallly, in three sessions   Time 5   Period Weeks   Status On-going     SLP SHORT TERM GOAL #3   Title pt will demo orientation to year, month, date with modified independence (calendar)   Time 5   Period Weeks   Status On-going     SLP SHORT TERM GOAL #4   Title pt will demo sustained attention to one task for 10 minutes to achieve 90% success   Time 5   Period Weeks   Status On-going          SLP Long Term Goals - 08/08/17 1738      SLP LONG TERM GOAL #1   Title pt will demonstrate sustained attention to one task for 15 mintues in order to achieve 90% success with rare nonverbal cues back to task, in two sessions   Time 10   Period Weeks  or 20 visits   Status On-going     SLP LONG TERM GOAL #2   Title pt will demo emergent awareness in a simple paper/pencil cognitive linguistic task 60% of the time given pt double checking responses   Time 10   Period Weeks  or 20 visits   Status On-going     SLP LONG TERM GOAL #3   Title pt will demo orientation to month, day, date with modified independence over 8 sessions   Time 10   Period Weeks  or 20 visits   Status On-going          Plan - 08/08/17 1737    Clinical Impression Statement Pt with mod to severe attention deficits hindering other areas of cognitive linguistics incliuding auditory comprehension/prcoessing, awareness, orientation, memory, problem solving, organization. Pt to decr to once a week due to insurance limitations - pt/family would like to extend time for visits. Pt would cont to benefit from skilled ST to improve cognitive  linguistics and to lessen caregiver burden.   Speech Therapy Frequency 1x /week   Duration --  10 weeks or 20 visits   Treatment/Interventions Language facilitation;Compensatory techniques;Internal/external aids;SLP instruction and feedback;Cognitive reorganization;Environmental controls;Multimodal communcation approach;Functional tasks;Cueing hierarchy;Patient/family education   Potential to Achieve Goals Fair   Potential Considerations Severity of impairments;Cooperation/participation level;Family/community support;Ability to learn/carryover information  hx of noncompliance with meds - ? also with therapy recommendations?   SLP Home Exercise Plan home tasks provided today   Consulted and Agree with Plan of Care Patient;Family member/caregiver   Family Member Consulted grandmother  Patient will benefit from skilled therapeutic intervention in order to improve the following deficits and impairments:   Cognitive communication deficit    Problem List Patient Active Problem List   Diagnosis Date Noted  . Urinary retention 07/12/2017  . Acute renal insufficiency 07/12/2017  . Acute cystitis without hematuria   . Hypotension   . 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 Acute urinary retention 06/26/2017  . Staphylococcus epidermidis ventriculitis 06/26/2017  . Bacteremia due to Streptococcus pneumoniae 06/26/2017  . Bordetella pertussis 06/26/2017  . 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 Transaminitis   . Endotracheal tube present   . Status epilepticus (HCC)   . History of syphilis   . Tobacco abuse   . Meningoencephalitis   . 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 Acute respiratory failure (HCC)   . Seizure (HCC) 05/20/2017  . Peri-rectal abscess 03/06/2017  . 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 Anemia 09/12/2016  . 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 Bipolar disorder  (HCC) 04/08/2013  . Rash and nonspecific skin eruption 10/10/2012    [facility] ,MS, CCC-SLP   08/08/2017, 5:39 PM  [facility] [address] [place], [name], [address] Phone: [phone]   Fax:  [phone]   Name: JAYQUAN BRADSHER MRN: [id number] Date of Birth: 1991-05-26

## 2017-08-09 NOTE — Addendum Note (Signed)
Addended by: Willa FraterFREEMAN, ANGELA D on: 08/09/2017 12:06 PM   Modules accepted: Orders

## 2017-08-14 ENCOUNTER — Encounter: Payer: Self-pay | Admitting: Physical Therapy

## 2017-08-14 ENCOUNTER — Ambulatory Visit: Payer: No Typology Code available for payment source | Admitting: Occupational Therapy

## 2017-08-14 ENCOUNTER — Ambulatory Visit: Payer: No Typology Code available for payment source | Admitting: Physical Therapy

## 2017-08-14 DIAGNOSIS — R41842 Visuospatial deficit: Secondary | ICD-10-CM

## 2017-08-14 DIAGNOSIS — R278 Other lack of coordination: Secondary | ICD-10-CM

## 2017-08-14 DIAGNOSIS — R41841 Cognitive communication deficit: Secondary | ICD-10-CM | POA: Diagnosis not present

## 2017-08-14 DIAGNOSIS — R2681 Unsteadiness on feet: Secondary | ICD-10-CM

## 2017-08-14 DIAGNOSIS — R41840 Attention and concentration deficit: Secondary | ICD-10-CM

## 2017-08-14 DIAGNOSIS — R2689 Other abnormalities of gait and mobility: Secondary | ICD-10-CM

## 2017-08-14 DIAGNOSIS — M6281 Muscle weakness (generalized): Secondary | ICD-10-CM

## 2017-08-14 DIAGNOSIS — R41844 Frontal lobe and executive function deficit: Secondary | ICD-10-CM

## 2017-08-14 NOTE — Patient Instructions (Signed)
    Activities for Justin Ball to work on at home:  1.  Practice folding towels and washcloths.  Encourage him to use both hands and to line up the corners.  (He tends to use only the right hand and goes too fast).  2.  Practice sorting dirty clothes.  3.  Practice making a sandwich:  Have Justin Ball get all items needed first, make sandwich, practice cutting in equal pieces (with butter knife--no sharp knife), then clean up after.  (Justin Ball had some difficulty getting started with each step, covering bread with peanut butter and cutting pieces into 4 evenly so practice will help his visual perceptual and cognitive skills)

## 2017-08-14 NOTE — Therapy (Signed)
[facility] [address] [place], [name], [address] Phone: [phone]   Fax:  [phone]  Occupational Therapy Treatment  Patient Details  Name: Justin Ball MRN: [id number] Date of Birth: 08/16/1991 Referring Provider: Dr. Paulette Blanch Dam  Encounter Date: 08/14/2017      OT End of Session - 08/14/17 1203    Visit Number 3   Number of Visits 17   Date for OT Re-Evaluation 09/29/17   Authorization Type Aetna--awaiting insurance verification; (Medicaid pending, also applied for Medicare)   OT Start Time 1154   OT Stop Time 1232   OT Time Calculation (min) 38 min   Activity Tolerance Patient tolerated treatment well   Behavior During Therapy [facility] for tasks assessed/performed      Past Medical History:  Diagnosis Date  . Acute renal insufficiency 07/12/2017  . Anxiety   . Bipolar 1 disorder (HCC)   . Depression   . HIV infection (HCC)   . Neuromuscular disorder (HCC)   . Scabies   . 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 Substance abuse   . Syphilis     Past Surgical History:  Procedure Laterality Date  . EXAMINATION UNDER ANESTHESIA N/A 12/28/2014   Procedure: EXAM UNDER ANESTHESIA;  Surgeon: Karie Soda, MD;  Location: [facility];  Service: General;  Laterality: N/A;  . FLOOR OF MOUTH BIOPSY N/A 09/16/2016   Procedure: BIOPSY OF ORAL ABCESS;  Surgeon: Newman Pies, MD;  Location: [facility];  Service: ENT;  Laterality: N/A;  . INCISION AND DRAINAGE ABSCESS N/A 09/16/2016   Procedure: INCISION AND DRAINAGE ABSCESS;  Surgeon: Newman Pies, MD;  Location: [facility];  Service: ENT;  Laterality: N/A;  . INCISION AND DRAINAGE PERIRECTAL ABSCESS  08/27/2010   Dr Carolynne Edouard  . INCISION AND DRAINAGE PERIRECTAL ABSCESS N/A 12/28/2014   Procedure: IRRIGATION AND DEBRIDEMENT PERIRECTAL ABSCESS;  Surgeon: Karie Soda, MD;  [facility]: [facility];  Service: General;  Laterality: N/A;  Fistula repair and ablation  . LAPAROSCOPIC REVISION  VENTRICULAR-PERITONEAL (V-P) SHUNT N/A 06/12/2017   Procedure: LAPAROSCOPIC REVISION VENTRICULAR-PERITONEAL (V-P) SHUNT;  Surgeon: Kinsinger, De Blanch, MD;  [facility]: [facility];  Service: General;  Laterality: N/A;  . VENTRICULOPERITONEAL SHUNT N/A 06/12/2017   Procedure: SHUNT INSERTION VENTRICULAR-PERITONEAL;  Surgeon: [name], MD;  [facility]: [facility];  Service: Neurosurgery;  Laterality: N/A;    There were no vitals filed for this visit.      Subjective Assessment - 08/14/17 1202    Subjective  Pt reports that he picked out his clothes this am.   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 No/denies   Pain Onset More than a month ago       Making peanut butter and jelly sandwich and cleaning up/putting items away after task.  Pt needed mod cueing/prompts throughout task for problem-solving, sequencing, initiation of steps, thoroughness, awareness.  Visual perceptual deficits noted with cutting sandwich into 4 equal pieces, did not spread peanut butter/jelly to edge of bread (decr accuracy/efficiency), and visual scanning to locate items.  Pt also demo difficulty with coordination (difficulty with  twist tie on bread).  Pt also needed cueing for recognizing problems and coordinating lid on peanut butter and to close top of jelly fully.    Sorting clothes by color.  Pt able to state what he needed to do but needed min cueing for accuracy.  Reading simple signs during therapy with min-mod cueing due to  omitting words.  Folding towels/washcloths with min-mod cueing for accuracy and to incorporate L hand more.  Picking up and carrying laundry basket for short distance with cueing needed to use BUEs as pt initially picked up with RUE only and did not recognize that he needed to use BUEs (initially spilled clothes).                         OT Education - 08/14/17 1337    Education Details Recommendations for Activities at home--see pt instructions for written instructions for grandmother   Person(s) Educated Patient   Methods Explanation;Handout   Comprehension Verbalized understanding          OT Short Term Goals - 07/31/17 2057      OT SHORT TERM GOAL #1   Title Pt/caregiver will be independent with initial HEP/functional tasks for home.--check STGs    Baseline dependent   Time 4   Period Weeks   Status New     OT SHORT TERM GOAL #2   Title Pt will perform UB dressing consistently without cueing.   Baseline needs cueing/prompts   Time 4   Period Weeks   Status New     OT SHORT TERM GOAL #3   Title Pt will be able to choose/retrieve appropriate clothing without verbal prompts.   Baseline not performing/set up currently   Time 4   Period Weeks   Status New     OT SHORT TERM GOAL #4   Title Pt will be able to fold laundry with set-up.   Baseline dependent/not performing   Time 4   Period Weeks   Status New     OT SHORT TERM GOAL #5   Title Pt/caregiver will verbalize understanding of cognitive compensation strategies for ADLs/functional tasks to incr participation in ADLs.   Baseline dependent   Time 4   Period Weeks   Status New           OT Long Term Goals - 07/31/17 2113      OT LONG TERM GOAL #1   Title Pt/caregiver will be independent with updated HEP/functional tasks for home.-- check LTGs 09/29/17   Baseline dependent   Time 8   Period Weeks   Status New     OT LONG TERM GOAL #2   Title Pt will perform bathing/dressing without  cueing (distant supervision).   Baseline min A, and prompts   Time 8   Period Weeks   Status New     OT LONG TERM GOAL #3   Title Pt will follow simple schedule/routine for ADLs with min cues.   Baseline pt needs cueing, assistance for initiation and throughout ADLs, decr attention/ability to follow directions   Time 8   Period Weeks   Status New     OT LONG TERM GOAL #4   Title Pt will perform simple snack prep with min cueing/supervision.   Baseline not performing/dependent   Time 8   Period Weeks   Status New     OT LONG TERM GOAL #5   Title Pt will perform simple home maintenance task with supervision/min  cueing.   Baseline not performing/dependent   Time 8   Period Weeks   Status New               Plan - 08/14/17 1338    Clinical Impression Statement Pt needs mod cueing for simple snack prep today, but is progressing towards goals.   Rehab Potential Fair   Current Impairments/barriers affecting progress: cognitive deficits, urinary catheter, severity of deficits   OT Frequency 2x / week   OT Duration 8 weeks  +evaluation   OT Treatment/Interventions Self-care/ADL training;Therapeutic exercise;DME and/or AE instruction;Building services engineer;Therapeutic activities;Patient/family education;Cognitive remediation/compensation;Neuromuscular education;Visual/perceptual remediation/compensation;Therapeutic exercises   Plan continue with simple functional tasks (visual scanning, donning shirt, folding t-shirt using strategies prn), continue with cognitive tips for ADLs   Consulted and Agree with Plan of Care Patient;Family member/caregiver   Family Member Consulted grandmother--Sue      Patient will benefit from skilled therapeutic intervention in order to improve the following deficits and impairments:  Decreased balance, Impaired vision/preception, Decreased cognition, Decreased knowledge of precautions, Impaired perceived functional ability, Impaired UE  functional use, Decreased strength, Decreased safety awareness, Decreased knowledge of use of DME, Decreased coordination, Decreased activity tolerance  Visit Diagnosis: Frontal lobe and executive function deficit  Attention and concentration deficit  Unsteadiness on feet  Visuospatial deficit  Other lack of coordination    Problem List Patient Active Problem List   Diagnosis Date Noted  . Urinary retention 07/12/2017  . Acute renal insufficiency 07/12/2017  . Acute cystitis without hematuria   . Hypotension   . 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 Acute urinary retention 06/26/2017  . Staphylococcus epidermidis ventriculitis 06/26/2017  . Bacteremia due to Streptococcus pneumoniae 06/26/2017  . Bordetella pertussis 06/26/2017  . 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 Transaminitis   . Endotracheal tube present   . Status epilepticus (HCC)   . History of syphilis   . Tobacco abuse   . Meningoencephalitis   . 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 Acute respiratory failure (HCC)   . Seizure (HCC) 05/20/2017  . Peri-rectal abscess 03/06/2017  . 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 Anemia 09/12/2016  . 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 Bipolar disorder (HCC) 04/08/2013  . Rash and nonspecific skin eruption 10/10/2012    [facility] 08/14/2017, 1:50 PM  [facility] [address] [place], [name], [address] Phone: [phone]   Fax:  [phone]  Name: Justin Ball MRN: [id number] Date of Birth: 1991-05-09   Willa Frater, OTR/L [facility] [address] [place], [name]  [address] [phone] phone [phone] 08/14/17 1:50 PM

## 2017-08-14 NOTE — Therapy (Signed)
[facility] [address] [place], [name], [address] Phone: [phone]   Fax:  [phone]  Physical Therapy Treatment  Patient Details  Name: Justin Ball MRN: [id number] Date of Birth: 1991-11-06 Referring Provider: Acey Lav, MD PCP  Encounter Date: 08/14/2017      PT End of Session - 08/14/17 1235    Visit Number 2   Number of Visits 5   Date for PT Re-Evaluation 09/07/17   Authorization Type Aetna, Medicaid pending   PT Start Time 1230   PT Stop Time 1319   PT Time Calculation (min) 49 min   Activity Tolerance Patient tolerated treatment well   Behavior During Therapy Flat affect      Past Medical History:  Diagnosis Date  . Acute renal insufficiency 07/12/2017  . Anxiety   . Bipolar 1 disorder (HCC)   . Depression   . HIV infection (HCC)   . Neuromuscular disorder (HCC)   . Scabies   . 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 Substance abuse   . Syphilis     Past Surgical History:  Procedure Laterality Date  . EXAMINATION UNDER ANESTHESIA N/A 12/28/2014   Procedure: EXAM UNDER ANESTHESIA;  Surgeon: Karie Soda, MD;  [facility]: [facility];  Service: General;  Laterality: N/A;  . FLOOR OF MOUTH BIOPSY N/A 09/16/2016   Procedure: BIOPSY OF ORAL ABCESS;  Surgeon: Newman Pies, MD;  [facility];  Service: ENT;  Laterality: N/A;  . INCISION AND DRAINAGE ABSCESS N/A 09/16/2016   Procedure: INCISION AND DRAINAGE ABSCESS;  Surgeon: Newman Pies, MD;  [facility];  Service: ENT;  Laterality: N/A;  . INCISION AND DRAINAGE PERIRECTAL ABSCESS  08/27/2010   Dr Carolynne Edouard  . INCISION AND DRAINAGE PERIRECTAL ABSCESS N/A 12/28/2014   Procedure: IRRIGATION AND DEBRIDEMENT PERIRECTAL ABSCESS;  Surgeon: Karie Soda, MD;  [facility]: [facility];  Service: General;  Laterality: N/A;  Fistula repair and ablation  . LAPAROSCOPIC REVISION VENTRICULAR-PERITONEAL (V-P) SHUNT N/A 06/12/2017   Procedure: LAPAROSCOPIC REVISION  VENTRICULAR-PERITONEAL (V-P) SHUNT;  Surgeon: Kinsinger, De Blanch, MD;  [facility]: [facility];  Service: General;  Laterality: N/A;  . VENTRICULOPERITONEAL SHUNT N/A 06/12/2017   Procedure: SHUNT INSERTION VENTRICULAR-PERITONEAL;  Surgeon: [name], MD;  [facility]: [facility];  Service: Neurosurgery;  Laterality: N/A;    There were no vitals filed for this visit.      Subjective Assessment - 08/14/17 1236    Subjective Pt reports no pain or falls. Reports he has been walking from the car to the mailbox and back.   Patient is accompained by: --  grandmother in car/waiting room   Currently in Pain? No/denies                         [facility] Adult PT Treatment/Exercise - 08/14/17 1235      Ambulation/Gait   Ambulation/Gait Yes   Ambulation/Gait Assistance 5: Supervision   Ambulation/Gait Assistance Details Verbal & tactile cues on maintaining pace & path with cognitive & scanning tasks.    Ambulation Distance (Feet) 1000 Feet   Assistive device None   Gait Pattern Step-through pattern;Decreased arm swing - left;Decreased arm swing - right;Decreased trunk rotation   Ambulation Surface Level;Indoor   Stairs Yes   Stairs Assistance 5: Supervision   Stairs Assistance Details (indicate cue type and reason) PT provided visual, verbal, tactile cueing for hand and foot placement on steps   Stair Management Technique Alternating pattern;One rail Right;Forwards  Number of Stairs 4  5 reps     High Level Balance   High Level Balance Activities Side stepping;Backward walking;Head turns  with PT supervision   High Level Balance Comments Pt performed side steps and ambulated with horizontal and vertical head turns and while performing cognitive tasks. Some gait deviations from path and slowing of velocity apparent. PT provided supervision for safety during ambulation and verbal cues to prompt naming during cognitive tasks.     Exercises   Exercises Knee/Hip     Knee/Hip  Exercises: Standing   Hip Flexion Right;Left;Both;10 reps  green theraband   Hip Flexion Limitations PT provided visual, verbal, demo and tactile cues to perform exercise correctly   Forward Lunges Right;Left;Both;5 reps   Forward Lunges Limitations PT provided visual, verbal, demo and tactile cues to perform exercise correctly. Pt had difficulty performing consistently.   Hip ADduction Right;Left;Both;10 reps  green theraband   Hip ADduction Limitations PT provided visual, verbal, demo and tactile cues to perform exercise correctly   Hip Abduction Right;Left;Both;10 reps  green theraband   Abduction Limitations PT provided visual, verbal, demo and tactile cues to perform exercise correctly   Hip Extension Right;Left;Both;10 reps  green theraband   Extension Limitations PT provided visual, verbal, demo and tactile cues to perform exercise correctly                PT Education - 08/14/17 1235    Education provided Yes   Education Details HEP recommendations; provided patient with demo and handout and discussed with grandmother after session   Person(s) Educated Patient;Caregiver(s)   Methods Explanation;Demonstration;Tactile cues;Verbal cues;Handout   Comprehension Verbalized understanding;Returned demonstration;Need further instruction             PT Long Term Goals - 07/31/17 1245      PT LONG TERM GOAL #1   Title Pt will improve score on FGA to >/= 21/30 to decrease risk of falls   Baseline FGA 17 on 07/31/18   Time 4   Period Weeks   Status New   Target Date 09/07/17     PT LONG TERM GOAL #2   Title Pt will ambulate 1000' feet without AD or physical assistance with supervision assistance only for cognition to ensure safe community ambulation   Baseline 300' with supervision for balance and stability   Time 4   Period Weeks   Status New   Target Date 09/07/17     PT LONG TERM GOAL #3   Title Pt will negotiate ramps/curbs/stairs & obstacles without AD or  physical assistance with supervision assistance only for cognition to ensure safe community ambulation   Baseline Pt appears dependent on architectural barriers   Time 4   Period Weeks   Status New   Target Date 09/07/17     PT LONG TERM GOAL #4   Title Pt will improve self selected walking speed to >/= 4.17 ft/sec to ensure safe commnuity ambulation   Baseline 3.35 ft/s on 07/31/17   Time 4   Period Weeks   Status New   Target Date 09/07/17               Plan - 08/14/17 1235    Clinical Impression Statement Pt continues to have deficits with endurance, gait, and balance. Due to insurance coverage limits, PT believes deficits can be addressed with an HEP so PT is attempting to instruct patient and family in HEP to address deficits. PT will assess family understanding & adjust as  needed.     Rehab Potential Good   PT Frequency 1x / week   PT Duration 4 weeks   PT Treatment/Interventions Gait training;Stair training;Functional mobility training;Therapeutic activities;Therapeutic exercise;Balance training;Neuromuscular re-education;Patient/family education   PT Next Visit Plan assess HEP with grandmother & adjust as needed. Check LTGs for possible discharge to save 2 of PT visits for OT /speech.    PT Home Exercise Plan --   Consulted and Agree with Plan of Care Patient;Family member/caregiver  grandmother      Patient will benefit from skilled therapeutic intervention in order to improve the following deficits and impairments:  Abnormal gait, Decreased balance, Decreased endurance, Decreased strength, Decreased cognition, Decreased activity tolerance, Decreased safety awareness, Decreased mobility, Postural dysfunction  Visit Diagnosis: Unsteadiness on feet  Muscle weakness (generalized)  Other abnormalities of gait and mobility  Other lack of coordination     Problem List Patient Active Problem List   Diagnosis Date Noted  . Urinary retention 07/12/2017  . Acute  renal insufficiency 07/12/2017  . Acute cystitis without hematuria   . Hypotension   . 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 Acute urinary retention 06/26/2017  . Staphylococcus epidermidis ventriculitis 06/26/2017  . Bacteremia due to Streptococcus pneumoniae 06/26/2017  . Bordetella pertussis 06/26/2017  . 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 Transaminitis   . Endotracheal tube present   . Status epilepticus (HCC)   . History of syphilis   . Tobacco abuse   . Meningoencephalitis   . 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 Acute respiratory failure (HCC)   . Seizure (HCC) 05/20/2017  . Peri-rectal abscess 03/06/2017  . 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 Anemia 09/12/2016  . 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 Bipolar disorder (HCC) 04/08/2013  . Rash and nonspecific skin eruption 10/10/2012   Lenda Kelp, SPT 08/14/2017, 4:52 PM  Vladimir Faster, PT, DPT 08/14/2017, 8:52 PM  [facility] [address] [place], [name], [address] Phone: [phone]   Fax:  [phone]  Name: Justin Ball MRN: [id number] Date of Birth: 02-01-1991

## 2017-08-15 ENCOUNTER — Encounter: Payer: No Typology Code available for payment source | Admitting: Occupational Therapy

## 2017-08-15 ENCOUNTER — Ambulatory Visit: Payer: No Typology Code available for payment source | Admitting: Physical Therapy

## 2017-08-21 ENCOUNTER — Ambulatory Visit: Payer: No Typology Code available for payment source | Admitting: Physical Therapy

## 2017-08-21 ENCOUNTER — Encounter: Payer: Self-pay | Admitting: Physical Therapy

## 2017-08-21 ENCOUNTER — Ambulatory Visit: Payer: No Typology Code available for payment source | Admitting: Occupational Therapy

## 2017-08-21 ENCOUNTER — Ambulatory Visit: Payer: No Typology Code available for payment source | Admitting: *Deleted

## 2017-08-21 DIAGNOSIS — R41841 Cognitive communication deficit: Secondary | ICD-10-CM | POA: Diagnosis not present

## 2017-08-21 DIAGNOSIS — R41840 Attention and concentration deficit: Secondary | ICD-10-CM

## 2017-08-21 DIAGNOSIS — R41842 Visuospatial deficit: Secondary | ICD-10-CM

## 2017-08-21 DIAGNOSIS — R41844 Frontal lobe and executive function deficit: Secondary | ICD-10-CM

## 2017-08-21 DIAGNOSIS — M6281 Muscle weakness (generalized): Secondary | ICD-10-CM

## 2017-08-21 DIAGNOSIS — R2689 Other abnormalities of gait and mobility: Secondary | ICD-10-CM

## 2017-08-21 DIAGNOSIS — R2681 Unsteadiness on feet: Secondary | ICD-10-CM

## 2017-08-21 DIAGNOSIS — R278 Other lack of coordination: Secondary | ICD-10-CM

## 2017-08-21 NOTE — Patient Instructions (Signed)
"  I love a Parade" Lift   At counter for balance as needed: high knee marching forward and then backward. 3 second pauses with each knee lift.  Repeat 3 laps each way. Do _1-2_ sessions per day. http://gt2.exer.us/345   Copyright  VHI. All rights reserved.  Side-Stepping   At counter for balance as needed: Walk to left side with eyes open. Take even steps, leading with same foot. Make sure each foot lifts off the floor. Repeat in opposite direction. Keep feet pointed toward counter. Repeat for 3 laps each way.  Do _1-2__ sessions per day.  Copyright  VHI. All rights reserved.  Walking on Heels   At counter: Walk on heels forward while continuing on a straight path, and then walk on heels backward to starting position. Repeat for 3 laps each way. Do _1-2__ sessions per day.  Copyright  VHI. All rights reserved.  Walking on Toes   At counter for balance as needed: Walk on toes forward while continuing on a straight path, and then backwards on toes to starting position. Repeat 3 laps each way. Do _1-2___ sessions per day.  Copyright  VHI. All rights reserved.  Feet Heel-Toe "Tandem"   At counter: Arms at sides, walk a straight line forward bringing one foot directly in front of the other, and then a straight line backwards bringing one foot directly behind the other one.  Repeat for _3 laps each way. Do _1-2_ sessions per day.  Copyright  VHI. All rights reserved.  Braiding   At counter for  Balance as needed: walking sideways with following pattern "front, step, back, step" along counter, repeat the pattern the other way to start position. Repeat 3 laps each direction. Do __1-2__ sessions per day.  Copyright  VHI. All rights reserved.  

## 2017-08-21 NOTE — Therapy (Signed)
[facility] [address] [place], [place], [address] Phone: [phone]   Fax:  [phone]  Physical Therapy Treatment  Patient Details  Name: FLINT HAKEEM MRN: [id number] Date of Birth: 07-31-1991 Referring Provider: Alcide Evener, MD PCP  Encounter Date: 08/21/2017      PT End of Session - 08/21/17 2222    Visit Number 3   Number of Visits 5   Date for PT Re-Evaluation 09/07/17   Authorization Type [employer], Medicaid pending   PT Start Time [phone]   PT Stop Time 1015   PT Time Calculation (min) 42 min   Activity Tolerance Patient tolerated treatment well   Behavior During Therapy Flat affect      Past Medical History:  Diagnosis Date  . Acute renal insufficiency 07/12/2017  . Anxiety   . Bipolar 1 disorder ([place])   . Depression   . HIV infection ([place])   . Neuromuscular disorder ([place])   . Scabies   . 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 Substance abuse   . Syphilis     Past Surgical History:  Procedure Laterality Date  . EXAMINATION UNDER ANESTHESIA N/A 12/28/2014   Procedure: EXAM UNDER ANESTHESIA;  Surgeon: Michael Boston, MD;  Location: [facility];  Service: General;  Laterality: N/A;  . FLOOR OF MOUTH BIOPSY N/A 09/16/2016   Procedure: BIOPSY OF ORAL ABCESS;  Surgeon: Leta Baptist, MD;  Location: [facility];  Service: ENT;  Laterality: N/A;  . INCISION AND DRAINAGE ABSCESS N/A 09/16/2016   Procedure: INCISION AND DRAINAGE ABSCESS;  Surgeon: Leta Baptist, MD;  Location: [facility];  Service: ENT;  Laterality: N/A;  . INCISION AND DRAINAGE PERIRECTAL ABSCESS  08/27/2010   Dr Marlou Starks  . INCISION AND DRAINAGE PERIRECTAL ABSCESS N/A 12/28/2014   Procedure: IRRIGATION AND DEBRIDEMENT PERIRECTAL ABSCESS;  Surgeon: Michael Boston, MD;  [facility];  Service: General;  Laterality: N/A;  Fistula repair and ablation  . LAPAROSCOPIC REVISION VENTRICULAR-PERITONEAL (V-P) SHUNT N/A 06/12/2017   Procedure: LAPAROSCOPIC REVISION  VENTRICULAR-PERITONEAL (V-P) SHUNT;  Surgeon: Kinsinger, Arta Bruce, MD;  Location: [facility];  Service: General;  Laterality: N/A;  . VENTRICULOPERITONEAL SHUNT N/A 06/12/2017   Procedure: SHUNT INSERTION VENTRICULAR-PERITONEAL;  Surgeon: Consuella Lose, MD;  Location: [facility];  Service: Neurosurgery;  Laterality: N/A;    There were no vitals filed for this visit.      Subjective Assessment - 08/21/17 0930    Subjective His grandmother reports walking in neighborhood & stores with family.    Patient is accompained by: Family member  grandmother   Pertinent History Neurosyphilis, encephalitis, AIDS, HIV-I, Bipolar, Substance abuse, Sz on 05/20/2017, syphilis, scabies, staph ventriculitis, Hep B, Brain shunt,    Patient Stated Goals To be able to balance & walk better. To return to work. He was delivering pizzas.    Currently in Pain? No/denies            [facility] PT Assessment - 08/21/17 0930      Ambulation/Gait   Ambulation/Gait Yes   Ambulation/Gait Assistance 5: Supervision  cognitive deficits   Ambulation Distance (Feet) 1500 Feet   Assistive device None   Ambulation Surface Indoor;Level;Outdoor;Paved;Gravel;Grass;Unlevel   Gait velocity 4.46 ft/sec comfortable,  6.01 ft/sec   Stairs Yes   Stairs Assistance 5: Supervision  cognitive deficits   Stair Management Technique No rails;Alternating pattern;Forwards   Number of Stairs 4   Ramp 5: Supervision  cognitive deficits, no balance losses   Curb 5: Supervision  cognitive  deficits, no balance losses     Berg Balance Test   Sit to Stand Able to stand without using hands and stabilize independently   Standing Unsupported Able to stand safely 2 minutes   Sitting with Back Unsupported but Feet Supported on Floor or Stool Able to sit safely and securely 2 minutes   Stand to Sit Sits safely with minimal use of hands   Transfers Able to transfer safely, minor use of hands   Standing Unsupported with Eyes Closed Able to stand 10  seconds safely   Standing Ubsupported with Feet Together Able to place feet together independently and stand 1 minute safely   From Standing, Reach Forward with Outstretched Arm Can reach confidently >25 cm (10")   From Standing Position, Pick up Object from Floor Able to pick up shoe safely and easily   From Standing Position, Turn to Look Behind Over each Shoulder Looks behind from both sides and weight shifts well   Turn 360 Degrees Able to turn 360 degrees safely in 4 seconds or less   Standing Unsupported, Alternately Place Feet on Step/Stool Able to stand independently and safely and complete 8 steps in 20 seconds   Standing Unsupported, One Foot in Front Able to place foot tandem independently and hold 30 seconds   Standing on One Leg Able to lift leg independently and hold > 10 seconds   Total Score 56     Functional Gait  Assessment   Gait assessed  Yes   Gait Level Surface Walks 20 ft in less than 5.5 sec, no assistive devices, good speed, no evidence for imbalance, normal gait pattern, deviates no more than 6 in outside of the 12 in walkway width.   Change in Gait Speed Able to smoothly change walking speed without loss of balance or gait deviation. Deviate no more than 6 in outside of the 12 in walkway width.   Gait with Horizontal Head Turns Performs head turns smoothly with slight change in gait velocity (eg, minor disruption to smooth gait path), deviates 6-10 in outside 12 in walkway width, or uses an assistive device.   Gait with Vertical Head Turns Performs task with slight change in gait velocity (eg, minor disruption to smooth gait path), deviates 6 - 10 in outside 12 in walkway width or uses assistive device   Gait and Pivot Turn Pivot turns safely within 3 sec and stops quickly with no loss of balance.   Step Over Obstacle Is able to step over 2 stacked shoe boxes taped together (9 in total height) without changing gait speed. No evidence of imbalance.   Gait with Narrow Base  of Support Ambulates 7-9 steps.   Gait with Eyes Closed Walks 20 ft, no assistive devices, good speed, no evidence of imbalance, normal gait pattern, deviates no more than 6 in outside 12 in walkway width. Ambulates 20 ft in less than 7 sec.   Ambulating Backwards Walks 20 ft, no assistive devices, good speed, no evidence for imbalance, normal gait   Steps Alternating feet, no rail.   Total Score 27                             PT Education - 08/21/17 1010    Education provided Yes   Education Details HEP for balance, Walking program with family including scanning & cognitive naming tasks   Person(s) Educated Patient;Other (comment)  grandmother   Methods Explanation;Demonstration;Tactile cues;Verbal cues;Handout  Comprehension Verbalized understanding;Verbal cues required;Tactile cues required  grandmother verbalized understanding.             PT Long Term Goals - 08/21/17 2223      PT LONG TERM GOAL #1   Title Pt will improve score on FGA to >/= 21/30 to decrease risk of falls   Baseline 08/21/2017  FGA 27/30   Time 4   Period Weeks   Status Achieved     PT LONG TERM GOAL #2   Title Pt will ambulate 1000' feet without AD or physical assistance with supervision assistance only for cognition to ensure safe community ambulation   Baseline Partially MET 08/21/2017  Pt ambulates 1300' outdoors with supervision with minor balance losses scanning on grass.   Time 4   Period Weeks   Status Partially Met     PT LONG TERM GOAL #3   Title Pt will negotiate ramps/curbs/stairs & obstacles without AD or physical assistance with supervision assistance only for cognition to ensure safe community ambulation   Baseline MET 08/21/2017   Time 4   Period Weeks   Status Achieved     PT LONG TERM GOAL #4   Title Pt will improve self selected walking speed to >/= 4.17 ft/sec to ensure safe commnuity ambulation   Baseline MET 08/21/2017  Gait velocity at comfortable pace  4.46 ft/sec and fast pace 6.01 ft/sec   Time 4   Period Weeks   Status Achieved               Plan - 08/21/17 2225    Clinical Impression Statement Patient met or partially met all LTGs today. His grandmother verbalizes understanding of ongoing HEP including balance exercises & walking program.    Rehab Potential Good   PT Frequency 1x / week   PT Duration 4 weeks   PT Treatment/Interventions Gait training;Stair training;Functional mobility training;Therapeutic activities;Therapeutic exercise;Balance training;Neuromuscular re-education;Patient/family education   PT Next Visit Plan Discharge PT   Consulted and Agree with Plan of Care Patient;Family member/caregiver  grandmother      Patient will benefit from skilled therapeutic intervention in order to improve the following deficits and impairments:  Abnormal gait, Decreased balance, Decreased endurance, Decreased strength, Decreased cognition, Decreased activity tolerance, Decreased safety awareness, Decreased mobility, Postural dysfunction  Visit Diagnosis: Unsteadiness on feet  Muscle weakness (generalized)  Other abnormalities of gait and mobility     Problem List Patient Active Problem List   Diagnosis Date Noted  . Urinary retention 07/12/2017  . Acute renal insufficiency 07/12/2017  . Acute cystitis without hematuria   . Hypotension   . 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 Acute urinary retention 06/26/2017  . Staphylococcus epidermidis ventriculitis 06/26/2017  . Bacteremia due to Streptococcus pneumoniae 06/26/2017  . Bordetella pertussis 06/26/2017  . 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 Transaminitis   . Endotracheal tube present   . Status epilepticus ([place])   . History of syphilis   . Tobacco abuse   . Meningoencephalitis   . Cryptococcal meningitis ([place])   . Acute respiratory failure ([place])   . Seizure ([place]) 05/20/2017  . Peri-rectal  abscess 03/06/2017  . 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 Anemia 09/12/2016  . 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 Bipolar disorder ([place]) 04/08/2013  . Rash and nonspecific skin eruption 10/10/2012   PHYSICAL THERAPY DISCHARGE SUMMARY  Visits  from Start of Care: 3  Current functional level related to goals / functional outcomes: See above   Remaining deficits: See above   Education / Equipment: HEP  Plan: Patient agrees to discharge.  Patient goals were met. Patient is being discharged due to meeting the stated rehab goals.  ?????         ,  PT, DPT  08/21/2017, 10:27 PM  [facility] [address] [place], [place], [address] Phone: [phone]   Fax:  [phone]  Name: DAI MCADAMS MRN: [id number] Date of Birth: 11/14/1991

## 2017-08-21 NOTE — Therapy (Signed)
[facility] [address] [place], [name], [address] Phone: [phone]   Fax:  [phone]  Speech Language Pathology Treatment  Patient Details  Name: Justin Ball MRN: [id number] Date of Birth: 1991-08-08 Referring Provider: [name], Remi Haggard, MD  Encounter Date: 08/21/2017      End of Session - 08/21/17 1215    Visit Number 4   Number of Visits 20   Date for SLP Re-Evaluation 10/19/17   Authorization - Visit Number 3   Authorization - Number of Visits 8   SLP Start Time 1015   SLP Stop Time  1100   SLP Time Calculation (min) 45 min   Activity Tolerance Patient tolerated treatment well      Past Medical History:  Diagnosis Date  . Acute renal insufficiency 07/12/2017  . Anxiety   . Bipolar 1 disorder ([facility])   . Depression   . HIV infection ([facility])   . Neuromuscular disorder ([facility])   . Scabies   . 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 Substance abuse   . Syphilis     Past Surgical History:  Procedure Laterality Date  . EXAMINATION UNDER ANESTHESIA N/A 12/28/2014   Procedure: EXAM UNDER ANESTHESIA;  Surgeon: Karie Soda, MD;  [facility];  Service: General;  Laterality: N/A;  . FLOOR OF MOUTH BIOPSY N/A 09/16/2016   Procedure: BIOPSY OF ORAL ABCESS;  Surgeon: Newman Pies, MD;  [facility];  Service: ENT;  Laterality: N/A;  . INCISION AND DRAINAGE ABSCESS N/A 09/16/2016   Procedure: INCISION AND DRAINAGE ABSCESS;  Surgeon: Newman Pies, MD;  [facility];  Service: ENT;  Laterality: N/A;  . INCISION AND DRAINAGE PERIRECTAL ABSCESS  08/27/2010   Dr Carolynne Edouard  . INCISION AND DRAINAGE PERIRECTAL ABSCESS N/A 12/28/2014   Procedure: IRRIGATION AND DEBRIDEMENT PERIRECTAL ABSCESS;  Surgeon: Karie Soda, MD;  [facility];  Service: General;  Laterality: N/A;  Fistula repair and ablation  . LAPAROSCOPIC REVISION VENTRICULAR-PERITONEAL (V-P) SHUNT N/A 06/12/2017   Procedure: LAPAROSCOPIC REVISION VENTRICULAR-PERITONEAL  (V-P) SHUNT;  Surgeon: Kinsinger, De Blanch, MD;  [facility];  Service: General;  Laterality: N/A;  . VENTRICULOPERITONEAL SHUNT N/A 06/12/2017   Procedure: SHUNT INSERTION VENTRICULAR-PERITONEAL;  Surgeon: [name], MD;  [facility];  Service: Neurosurgery;  Laterality: N/A;    There were no vitals filed for this visit.      Subjective Assessment - 08/21/17 1017    Subjective I want to speak better and walk better   Patient is accompained by: Family member  grandmother Justin Ball   Currently in Pain? No/denies               ADULT SLP TREATMENT - 08/21/17 0001      General Information   Behavior/Cognition Alert;Cooperative     Treatment Provided   Treatment provided Cognitive-Linquistic     Cognitive-Linquistic Treatment   Treatment focused on Cognition;Patient/family/caregiver education   Skilled Treatment ST session focused on introduction of new SLP and review of goals. [name] grandmother frequently answered questions SLP was asking of the pt (family members names, orientation information, cause for need of rehab, etc). He was able to engage in conversation about his hobbies and talents (hip hop and R&B music, playing trombone, mortal combat). Pt required max cues to read instructions for improving intelligibility of speech. Pt was able to list tasks he does at home (fold clothes, do the dishes, make his bed, check mail, etc). Pt accurate for year, month, and day  of the week without assistance. Discussed functional problem areas including memory, attention, and communication. Pt stood up 2x during the session but returned to his seat quickly requiring no redirection.      Assessment / Recommendations / Plan   Plan Continue with current plan of care     Progression Toward Goals   Progression toward goals Progressing toward goals          SLP Education - 08/21/17 1214    Education provided Yes   Education Details Strategies for improving intelligibility    Person(s) Educated Patient;Caregiver(s)   Methods Explanation;Demonstration;Verbal cues;Handout   Comprehension Verbalized understanding;Need further instruction          SLP Short Term Goals - 08/21/17 1217      SLP SHORT TERM GOAL #1   Title pt will demo sustained attention for 5 minutes, in 3 different tasks completed 90% success, in 3 sessions   Time 4   Period Weeks   Status On-going     SLP SHORT TERM GOAL #2   Title pt will tell SLP 2 cognitive defict areas with min A occasionallly, in three sessions   Time 4   Period Weeks   Status On-going     SLP SHORT TERM GOAL #3   Title pt will demo orientation to year, month, date with modified independence (calendar)   Time 4   Period Weeks   Status On-going     SLP SHORT TERM GOAL #4   Title pt will demo sustained attention to one task for 10 minutes to achieve 90% success   Time 4   Period Weeks   Status On-going          SLP Long Term Goals - 08/21/17 1218      SLP LONG TERM GOAL #1   Title pt will demonstrate sustained attention to one task for 15 mintues in order to achieve 90% success with rare nonverbal cues back to task, in two sessions   Time 9   Period Weeks  or 20 visits   Status On-going     SLP LONG TERM GOAL #2   Title pt will demo emergent awareness in a simple paper/pencil cognitive linguistic task 60% of the time given pt double checking responses   Time 9   Period Weeks  or 20 visits   Status On-going     SLP LONG TERM GOAL #3   Title pt will demo orientation to month, day, date with modified independence over 8 sessions   Time 9   Period Weeks  or 20 visits   Status On-going          Plan - 08/21/17 1215    Clinical Impression Statement Pt continues to exhibit moderate to severe deficits in orientation, attention, recall, and verbal expression, but was pleasant and cooperative with unfamiliar therapist. Continued ST intervention is recommended to maximize cognitive linguistic skills  for improved safety and independence, as well as decreased burden of care.   Speech Therapy Frequency 1x /week   Duration --  10 weeks or 20 visits   Treatment/Interventions Language facilitation;Compensatory techniques;Internal/external aids;SLP instruction and feedback;Cognitive reorganization;Environmental controls;Multimodal communcation approach;Functional tasks;Cueing hierarchy;Patient/family education   Potential to Achieve Goals Fair   Potential Considerations Severity of impairments;Cooperation/participation level;Family/community support;Ability to learn/carryover information   SLP Home Exercise Plan home tasks provided today   Consulted and Agree with Plan of Care Patient;Family member/caregiver   Family Member Consulted grandmother Justin Ball      Patient will benefit  from skilled therapeutic intervention in order to improve the following deficits and impairments:   Attention and concentration deficit  Cognitive communication deficit    Problem List Patient Active Problem List   Diagnosis Date Noted  . Urinary retention 07/12/2017  . Acute renal insufficiency 07/12/2017  . Acute cystitis without hematuria   . Hypotension   . 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 Acute urinary retention 06/26/2017  . Staphylococcus epidermidis ventriculitis 06/26/2017  . Bacteremia due to Streptococcus pneumoniae 06/26/2017  . Bordetella pertussis 06/26/2017  . 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 Transaminitis   . Endotracheal tube present   . Status epilepticus ([facility])   . History of syphilis   . Tobacco abuse   . Meningoencephalitis   . 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 Acute respiratory failure ([facility])   . Seizure ([facility]) 05/20/2017  . Peri-rectal abscess 03/06/2017  . 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 Anemia 09/12/2016  . 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 Bipolar disorder  ([facility]) 04/08/2013  . Rash and nonspecific skin eruption 10/10/2012   Justin Ball, [facility], CCC-SLP Speech Pathologist  [facility] 08/21/2017, 12:19 PM  [facility] [address] [place], [name], [address] Phone: [phone]   Fax:  [phone]   Name: Justin Ball MRN: [id number] Date of Birth: 1991/11/28

## 2017-08-21 NOTE — Therapy (Signed)
[facility] [address] [place], [name], [address] Phone: [phone]   Fax:  [phone]  Occupational Therapy Treatment  Patient Details  Name: Justin Ball MRN: [id number] Date of Birth: 1991-09-03 Referring Provider: Dr. Paulette Blanch Dam  Encounter Date: 08/21/2017      OT End of Session - 08/21/17 1315    Visit Number 4   Number of Visits 17   Authorization Type [employer]--awaiting insurance verification; (Medicaid pending, also applied for Medicare)   OT Start Time 0850   OT Stop Time 0930   OT Time Calculation (min) 40 min      Past Medical History:  Diagnosis Date  . Acute renal insufficiency 07/12/2017  . Anxiety   . Bipolar 1 disorder (HCC)   . Depression   . HIV infection (HCC)   . Neuromuscular disorder (HCC)   . Scabies   . 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 Substance abuse   . Syphilis     Past Surgical History:  Procedure Laterality Date  . EXAMINATION UNDER ANESTHESIA N/A 12/28/2014   Procedure: EXAM UNDER ANESTHESIA;  Surgeon: Karie Soda, MD;  [facility]: [facility];  Service: General;  Laterality: N/A;  . FLOOR OF MOUTH BIOPSY N/A 09/16/2016   Procedure: BIOPSY OF ORAL ABCESS;  Surgeon: Newman Pies, MD;  [facility]: [facility];  Service: ENT;  Laterality: N/A;  . INCISION AND DRAINAGE ABSCESS N/A 09/16/2016   Procedure: INCISION AND DRAINAGE ABSCESS;  Surgeon: Newman Pies, MD;  [facility];  Service: ENT;  Laterality: N/A;  . INCISION AND DRAINAGE PERIRECTAL ABSCESS  08/27/2010   Dr Carolynne Edouard  . INCISION AND DRAINAGE PERIRECTAL ABSCESS N/A 12/28/2014   Procedure: IRRIGATION AND DEBRIDEMENT PERIRECTAL ABSCESS;  Surgeon: Karie Soda, MD;  [facility];  Service: General;  Laterality: N/A;  Fistula repair and ablation  . LAPAROSCOPIC REVISION VENTRICULAR-PERITONEAL (V-P) SHUNT N/A 06/12/2017   Procedure: LAPAROSCOPIC REVISION VENTRICULAR-PERITONEAL (V-P) SHUNT;  Surgeon: Kinsinger, De Blanch, MD;  [facility];  Service: General;  Laterality: N/A;  . VENTRICULOPERITONEAL SHUNT N/A 06/12/2017   Procedure: SHUNT INSERTION VENTRICULAR-PERITONEAL;  Surgeon: [name], MD;  [facility];  Service: Neurosurgery;  Laterality: N/A;    There were no vitals filed for this visit.      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 No/denies       Treatment: Placing various shapes into concentration frame, mod / max v.c for scanning, orientation and placement of pieces. Card sorting task, with mod v.c for increased use of LUE. Standing to fold towels, washclothes, and pillow cases with min-mod v.c for orientation.                         OT Short Term Goals - 07/31/17 2057      OT SHORT TERM GOAL #1   Title Pt/caregiver will be independent with initial HEP/functional tasks for home.--check STGs    Baseline dependent   Time 4   Period Weeks   Status New  OT SHORT TERM GOAL #2   Title Pt will perform UB dressing consistently without cueing.   Baseline needs cueing/prompts   Time 4   Period Weeks   Status New     OT SHORT TERM GOAL #3   Title Pt will be able to choose/retrieve appropriate clothing without verbal prompts.   Baseline not performing/set up currently   Time 4   Period Weeks   Status New     OT SHORT TERM GOAL #4   Title Pt will be able to fold laundry with set-up.   Baseline dependent/not performing   Time 4   Period Weeks   Status New      OT SHORT TERM GOAL #5   Title Pt/caregiver will verbalize understanding of cognitive compensation strategies for ADLs/functional tasks to incr participation in ADLs.   Baseline dependent   Time 4   Period Weeks   Status New           OT Long Term Goals - 07/31/17 2113      OT LONG TERM GOAL #1   Title Pt/caregiver will be independent with updated HEP/functional tasks for home.-- check LTGs 09/29/17   Baseline dependent   Time 8   Period Weeks   Status New     OT LONG TERM GOAL #2   Title Pt will perform bathing/dressing without cueing (distant supervision).   Baseline min A, and prompts   Time 8   Period Weeks   Status New     OT LONG TERM GOAL #3   Title Pt will follow simple schedule/routine for ADLs with min cues.   Baseline pt needs cueing, assistance for initiation and throughout ADLs, decr attention/ability to follow directions   Time 8   Period Weeks   Status New     OT LONG TERM GOAL #4   Title Pt will perform simple snack prep with min cueing/supervision.   Baseline not performing/dependent   Time 8   Period Weeks   Status New     OT LONG TERM GOAL #5   Title Pt will perform simple home maintenance task with supervision/min cueing.   Baseline not performing/dependent   Time 8   Period Weeks   Status New               Plan - 08/21/17 1316    Clinical Impression Statement Pt is progressing towards goals. He requires significant v.c to stay focused on task and for LUE functional use.   Rehab Potential Fair   Current Impairments/barriers affecting progress: cognitive deficits, urinary catheter, severity of deficits   OT Frequency 2x / week   OT Duration 8 weeks   OT Treatment/Interventions Self-care/ADL training;Therapeutic exercise;DME and/or AE instruction;Building services engineer;Therapeutic activities;Patient/family education;Cognitive remediation/compensation;Neuromuscular education;Visual/perceptual remediation/compensation;Therapeutic  exercises   Plan simple functional tasks, cogntive tips for ADLS   Consulted and Agree with Plan of Care Patient;Family member/caregiver   Family Member Consulted grandmother--Sue      Patient will benefit from skilled therapeutic intervention in order to improve the following deficits and impairments:  Decreased balance, Impaired vision/preception, Decreased cognition, Decreased knowledge of precautions, Impaired perceived functional ability, Impaired UE functional use, Decreased strength, Decreased safety awareness, Decreased knowledge of use of DME, Decreased coordination, Decreased activity tolerance  Visit Diagnosis: Attention and concentration deficit  Frontal lobe and executive function deficit  Unsteadiness on feet  Visuospatial deficit  Other lack of coordination    Problem List Patient Active Problem List   Diagnosis Date Noted  .  Urinary retention 07/12/2017  . Acute renal insufficiency 07/12/2017  . Acute cystitis without hematuria   . Hypotension   . 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 Acute urinary retention 06/26/2017  . Staphylococcus epidermidis ventriculitis 06/26/2017  . Bacteremia due to Streptococcus pneumoniae 06/26/2017  . Bordetella pertussis 06/26/2017  . 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 Transaminitis   . Endotracheal tube present   . Status epilepticus (HCC)   . History of syphilis   . Tobacco abuse   . Meningoencephalitis   . 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 Acute respiratory failure (HCC)   . Seizure (HCC) 05/20/2017  . Peri-rectal abscess 03/06/2017  . 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 Anemia 09/12/2016  . 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 Bipolar disorder (HCC) 04/08/2013  . Rash and nonspecific skin eruption 10/10/2012    Ademola Vert 08/21/2017, 1:19 PM  [facility] [address] [place], [name], [address] Phone: [phone]   Fax:  [phone]  Name: RAYLAND HAMED MRN: [id number] Date of Birth: 1991-09-17

## 2017-08-22 ENCOUNTER — Encounter: Payer: No Typology Code available for payment source | Admitting: Occupational Therapy

## 2017-08-28 ENCOUNTER — Ambulatory Visit: Payer: Medicaid Other | Attending: Internal Medicine | Admitting: Occupational Therapy

## 2017-08-28 ENCOUNTER — Ambulatory Visit: Payer: No Typology Code available for payment source | Admitting: Physical Therapy

## 2017-08-28 ENCOUNTER — Ambulatory Visit: Payer: Medicaid Other

## 2017-08-28 DIAGNOSIS — R2681 Unsteadiness on feet: Secondary | ICD-10-CM | POA: Diagnosis present

## 2017-08-28 DIAGNOSIS — R41840 Attention and concentration deficit: Secondary | ICD-10-CM | POA: Insufficient documentation

## 2017-08-28 DIAGNOSIS — R41844 Frontal lobe and executive function deficit: Secondary | ICD-10-CM | POA: Diagnosis present

## 2017-08-28 DIAGNOSIS — M6281 Muscle weakness (generalized): Secondary | ICD-10-CM | POA: Insufficient documentation

## 2017-08-28 DIAGNOSIS — R41841 Cognitive communication deficit: Secondary | ICD-10-CM | POA: Diagnosis present

## 2017-08-28 DIAGNOSIS — R41842 Visuospatial deficit: Secondary | ICD-10-CM | POA: Diagnosis present

## 2017-08-28 DIAGNOSIS — R278 Other lack of coordination: Secondary | ICD-10-CM | POA: Diagnosis present

## 2017-08-28 NOTE — Therapy (Signed)
[facility] [address] [place], [place], [address] Phone: [phone]   Fax:  [phone]  Speech Language Pathology Treatment  Patient Details  Name: Justin Ball MRN: [id number] Date of Birth: 05/24/1991 Referring Provider: Daiva EvesVan Dam, Remi Haggardornelius, MD  Encounter Date: 08/28/2017      End of Session - 08/28/17 1631    Visit Number 5   Number of Visits 20   Date for SLP Re-Evaluation 10/19/17   Authorization Type [employer] - 25 visits combined - HARD LIMIT   Authorization - Visit Number 4   Authorization - Number of Visits 8   SLP Start Time 1533   SLP Stop Time  1615   SLP Time Calculation (min) 42 min   Activity Tolerance Patient tolerated treatment well      Past Medical History:  Diagnosis Date  . Acute renal insufficiency 07/12/2017  . Anxiety   . Bipolar 1 disorder (HCC)   . Depression   . HIV infection (HCC)   . Neuromuscular disorder (HCC)   . Scabies   . 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 Substance abuse   . Syphilis     Past Surgical History:  Procedure Laterality Date  . EXAMINATION UNDER ANESTHESIA N/A 12/28/2014   Procedure: EXAM UNDER ANESTHESIA;  Surgeon: Karie SodaSteven Gross, MD;  [facility];  Service: General;  Laterality: N/A;  . FLOOR OF MOUTH BIOPSY N/A 09/16/2016   Procedure: BIOPSY OF ORAL ABCESS;  Surgeon: Newman PiesSu Teoh, MD;  [facility];  Service: ENT;  Laterality: N/A;  . INCISION AND DRAINAGE ABSCESS N/A 09/16/2016   Procedure: INCISION AND DRAINAGE ABSCESS;  Surgeon: Newman PiesSu Teoh, MD;  [facility];  Service: ENT;  Laterality: N/A;  . INCISION AND DRAINAGE PERIRECTAL ABSCESS  08/27/2010   Dr Carolynne Edouardoth  . INCISION AND DRAINAGE PERIRECTAL ABSCESS N/A 12/28/2014   Procedure: IRRIGATION AND DEBRIDEMENT PERIRECTAL ABSCESS;  Surgeon: Karie SodaSteven Gross, MD;  [facility];  Service: General;  Laterality: N/A;  Fistula repair and ablation  . LAPAROSCOPIC REVISION VENTRICULAR-PERITONEAL (V-P) SHUNT N/A  06/12/2017   Procedure: LAPAROSCOPIC REVISION VENTRICULAR-PERITONEAL (V-P) SHUNT;  Surgeon: Kinsinger, De BlanchLuke Aaron, MD;  [facility];  Service: General;  Laterality: N/A;  . VENTRICULOPERITONEAL SHUNT N/A 06/12/2017   Procedure: SHUNT INSERTION VENTRICULAR-PERITONEAL;  Surgeon: Lisbeth RenshawNundkumar, Neelesh, MD;  [facility]: [facility];  Service: Neurosurgery;  Laterality: N/A;    There were no vitals filed for this visit.      Subjective Assessment - 08/28/17 1534    Subjective Pt will be canceled tomorrow due to unsure if [employer] will pay for visits.   Patient is accompained by: --  grandmother   Currently in Pain? No/denies               ADULT SLP TREATMENT - 08/28/17 1536      General Information   Behavior/Cognition Alert;Cooperative  flat affect     Treatment Provided   Treatment provided Cognitive-Linquistic     Cognitive-Linquistic Treatment   Treatment focused on Cognition   Skilled Treatment SLP began session with temporal orientation (day, date, month, year). Pt req'd max A for correct date and day, mod cues for month, min cues for year.  Five minutes later pt recalled month and year but req'd max A for day, and mod-max A for date.. Pt recalled to look at calendar and also what 5 minute alarm was for. He attended to three 3-minute tasks with occasional min A back to task. In simple verbal  sequences of 4-5 steps pt omitted pertinent details consistently. Pt appeared aphasic today during some conversation with SLP.      Assessment / Recommendations / Plan   Plan Continue with current plan of care     Progression Toward Goals   Progression toward goals Progressing toward goals            SLP Short Term Goals - 08/28/17 1644      SLP SHORT TERM GOAL #1   Title pt will demo sustained attention for 5 minutes, in 3 different tasks completed 90% success, in 3 sessions   Time 3   Period Weeks   Status On-going     SLP SHORT TERM GOAL #2   Title pt will tell SLP 2 cognitive  defict areas with min A occasionallly, in three sessions   Time 3   Period Weeks   Status On-going     SLP SHORT TERM GOAL #3   Title pt will demo orientation to year, month, date with modified independence (calendar)   Time 3   Period Weeks   Status On-going     SLP SHORT TERM GOAL #4   Title pt will demo sustained attention to one task for 10 minutes to achieve 90% success   Time 3   Period Weeks   Status On-going          SLP Long Term Goals - 08/28/17 1645      SLP LONG TERM GOAL #1   Title pt will demonstrate sustained attention to one task for 15 mintues in order to achieve 90% success with rare nonverbal cues back to task, in two sessions   Time 8   Period Weeks  or 20 visits   Status On-going     SLP LONG TERM GOAL #2   Title pt will demo emergent awareness in a simple paper/pencil cognitive linguistic task 60% of the time given pt double checking responses   Time 8   Period Weeks  or 20 visits   Status On-going     SLP LONG TERM GOAL #3   Title pt will demo orientation to month, day, date with modified independence over 8 sessions   Time 8   Period Weeks  or 20 visits   Status On-going          Plan - 08/28/17 1631    Clinical Impression Statement Pt continues to exhibit moderate to severe deficits in orientation, attention, recall, and verbal expression. Please see "skilled intervention" for further details. Continued ST intervention is recommended to maximize cognitive linguistic skills for improved safety and independence, as well as decreased burden of care.   Speech Therapy Frequency 1x /week   Duration --  10 weeks or 20 visits   Treatment/Interventions Language facilitation;Compensatory techniques;Internal/external aids;SLP instruction and feedback;Cognitive reorganization;Environmental controls;Multimodal communcation approach;Functional tasks;Cueing hierarchy;Patient/family education   Potential to Achieve Goals Fair   Potential Considerations  Severity of impairments;Cooperation/participation level;Family/community support;Ability to learn/carryover information   SLP Home Exercise Plan home tasks provided today   Consulted and Agree with Plan of Care Patient;Family member/caregiver   Family Member Consulted grandmother [name]      Patient will benefit from skilled therapeutic intervention in order to improve the following deficits and impairments:   Cognitive communication deficit    Problem List Patient Active Problem List   Diagnosis Date Noted  . Urinary retention 07/12/2017  . Acute renal insufficiency 07/12/2017  . Acute cystitis without hematuria   . Hypotension   .  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 Acute urinary retention 06/26/2017  . Staphylococcus epidermidis ventriculitis 06/26/2017  . Bacteremia due to Streptococcus pneumoniae 06/26/2017  . Bordetella pertussis 06/26/2017  . 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 Transaminitis   . Endotracheal tube present   . Status epilepticus (HCC)   . History of syphilis   . Tobacco abuse   . Meningoencephalitis   . 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 Acute respiratory failure (HCC)   . Seizure (HCC) 05/20/2017  . Peri-rectal abscess 03/06/2017  . 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 Anemia 09/12/2016  . 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 Bipolar disorder (HCC) 04/08/2013  . Rash and nonspecific skin eruption 10/10/2012    [facility] ,MS, CCC-SLP  08/28/2017, 4:47 PM  [facility] [address] [place], [name], [address] Phone: [phone]   Fax:  [phone]   Name: Justin Ball MRN: [id number] Date of Birth: 1991/09/22

## 2017-08-28 NOTE — Patient Instructions (Signed)
  Please complete the assigned speech therapy homework prior to your next session and return it to the speech therapist at your next visit.  

## 2017-08-29 ENCOUNTER — Ambulatory Visit: Payer: Medicaid Other | Admitting: Occupational Therapy

## 2017-08-29 NOTE — Therapy (Signed)
[facility] [address] [place], [name], [address] Phone: [phone]   Fax:  [phone]  Occupational Therapy Treatment  Patient Details  Name: Justin Ball MRN: [id number] Date of Birth: [birth year] Referring Provider: Dr. Paulette Blanch Dam  Encounter Date: 08/28/2017      OT End of Session - 08/29/17 1136    Visit Number 5   Number of Visits 17   Date for OT Re-Evaluation 09/29/17   Authorization Type [employer]--awaiting insurance verification; (Medicaid pending, also applied for Medicare)   Authorization Time Period 25 visits combined (PT used 3)   Authorization - Visit Number 5   Authorization - Number of Visits 11   OT Start Time 1450   OT Stop Time 1530   OT Time Calculation (min) 40 min   Activity Tolerance Patient tolerated treatment well      Past Medical History:  Diagnosis Date  . Acute renal insufficiency 07/12/2017  . Anxiety   . Bipolar 1 disorder (HCC)   . Depression   . HIV infection (HCC)   . Neuromuscular disorder (HCC)   . Scabies   . 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 Substance abuse   . Syphilis     Past Surgical History:  Procedure Laterality Date  . EXAMINATION UNDER ANESTHESIA N/A 12/28/2014   Procedure: EXAM UNDER ANESTHESIA;  Surgeon: Karie Soda, MD;  [facility];  Service: General;  Laterality: N/A;  . FLOOR OF MOUTH BIOPSY N/A 09/16/2016   Procedure: BIOPSY OF ORAL ABCESS;  Surgeon: Newman Pies, MD;  [facility];  Service: ENT;  Laterality: N/A;  . INCISION AND DRAINAGE ABSCESS N/A 09/16/2016   Procedure: INCISION AND DRAINAGE ABSCESS;  Surgeon: Newman Pies, MD;  [facility];  Service: ENT;  Laterality: N/A;  . INCISION AND DRAINAGE PERIRECTAL ABSCESS  08/27/2010   Dr Carolynne Edouard  . INCISION AND DRAINAGE PERIRECTAL ABSCESS N/A 12/28/2014   Procedure: IRRIGATION AND DEBRIDEMENT PERIRECTAL ABSCESS;  Surgeon: Karie Soda, MD;  [facility];  Service: General;  Laterality: N/A;   Fistula repair and ablation  . LAPAROSCOPIC REVISION VENTRICULAR-PERITONEAL (V-P) SHUNT N/A 06/12/2017   Procedure: LAPAROSCOPIC REVISION VENTRICULAR-PERITONEAL (V-P) SHUNT;  Surgeon: Kinsinger, De Blanch, MD;  [facility];  Service: General;  Laterality: N/A;  . VENTRICULOPERITONEAL SHUNT N/A 06/12/2017   Procedure: SHUNT INSERTION VENTRICULAR-PERITONEAL;  Surgeon: [name], MD;  [facility];  Service: Neurosurgery;  Laterality: N/A;    There were no vitals filed for this visit.      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 No/denies                  Treatment: Discussion with pt/ grandmother regarding ADL performance at home and progress towards goals. Therapist reinforced importance of pt assisting with tasks that are safe for him like folding laundry, however pt may need v.c to initiate task. Completing a 12 piece puzzle to address attention, organization, and visual perceptual skills, max difficulty/ v.c Patient writing  activity to generate several sentences about himself                OT Short Term Goals - 08/28/17 1455      OT SHORT TERM GOAL #1   Time 4   Period Weeks   Status On-going     OT SHORT TERM GOAL #2   Title Pt will perform UB dressing consistently without cueing.   Time 4   Period Weeks   Status Achieved  removing shirt min A due to long hair     OT SHORT TERM GOAL #3   Title Pt  will be able to choose/retrieve appropriate clothing without verbal prompts.   Time 4   Period Weeks   Status On-going  able to choose between 2 options     OT SHORT TERM GOAL #4   Title Pt will be able to fold laundry with set-up.   Time 4   Period Weeks   Status On-going  min verbal cues     OT SHORT TERM GOAL #5   Title Pt/caregiver will verbalize understanding of cognitive compensation strategies for ADLs/functional tasks to incr participation in ADLs.   Time 4   Period Weeks   Status On-going           OT Long Term Goals - 07/31/17 2113      OT LONG TERM GOAL #1   Title Pt/caregiver will be independent with updated HEP/functional tasks for home.-- check LTGs 09/29/17   Baseline dependent   Time 8   Period Weeks   Status New     OT LONG TERM GOAL #2   Title Pt will perform bathing/dressing without cueing (distant supervision).   Baseline min A, and prompts   Time 8   Period Weeks   Status New     OT LONG TERM GOAL #3   Title Pt will follow simple schedule/routine for ADLs with min cues.   Baseline pt needs cueing, assistance for initiation and throughout ADLs, decr attention/ability to follow directions   Time 8   Period Weeks   Status New     OT LONG TERM GOAL #4   Title Pt will perform simple snack prep with min cueing/supervision.   Baseline not performing/dependent   Time 8   Period Weeks   Status New     OT LONG TERM GOAL #5   Title Pt will perform simple home maintenance task with supervision/min cueing.   Baseline not performing/dependent   Time 8   Period Weeks   Status New               Plan - 08/29/17 1138    Clinical Impression Statement Pt is progressing slowly towards goals. He requires v.c to sustain attention to task. discussion with pt/ grandmother regarding pt's limited visits throught [employer] and uncertainty of Medicaid approval for visist. Pt'/grandmother chose to reduce frequency to 1x this week.   Rehab Potential Fair    Current Impairments/barriers affecting progress: cognitive deficits, urinary catheter, severity of deficits   OT Frequency 2x / week   OT Duration 8 weeks   OT Treatment/Interventions Self-care/ADL training;Therapeutic exercise;DME and/or AE instruction;Building services engineer;Therapeutic activities;Patient/family education;Cognitive remediation/compensation;Neuromuscular education;Visual/perceptual remediation/compensation;Therapeutic exercises   Plan continue simple functional tasks, continue cognitive tips for ADLs.   Consulted and Agree with Plan of Care Patient;Family member/caregiver   Family Member Consulted grandmother--Sue      Patient will benefit from skilled therapeutic intervention in order to improve the following deficits  and impairments:  Decreased balance, Impaired vision/preception, Decreased cognition, Decreased knowledge of precautions, Impaired perceived functional ability, Impaired UE functional use, Decreased strength, Decreased safety awareness, Decreased knowledge of use of DME, Decreased coordination, Decreased activity tolerance  Visit Diagnosis: Attention and concentration deficit  Visuospatial deficit  Other lack of coordination    Problem List Patient Active Problem List   Diagnosis Date Noted  . Urinary retention 07/12/2017  . Acute renal insufficiency 07/12/2017  . Acute cystitis without hematuria   . Hypotension   . 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 Acute urinary retention 06/26/2017  . Staphylococcus epidermidis ventriculitis 06/26/2017  . Bacteremia due to Streptococcus pneumoniae 06/26/2017  . Bordetella pertussis 06/26/2017  . 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 Transaminitis   . Endotracheal tube present   . Status epilepticus (HCC)   . History of syphilis   . Tobacco abuse   . Meningoencephalitis   . 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 Acute respiratory failure (HCC)   .  Seizure (HCC) 05/20/2017  . Peri-rectal abscess 03/06/2017  . 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 Anemia 09/12/2016  . 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 Bipolar disorder (HCC) 04/08/2013  . Rash and nonspecific skin eruption 10/10/2012    Elanna Bert 08/29/2017, 11:41 AM  [place] [facility] [address] [place], [place], [address] Phone: [phone]   Fax:  [phone]  Name: Ola SpurrLinston R Dascenzo MRN: [id number] Date of Birth: 01/18/1991

## 2017-09-04 ENCOUNTER — Ambulatory Visit: Payer: Medicaid Other

## 2017-09-04 ENCOUNTER — Ambulatory Visit: Payer: No Typology Code available for payment source | Admitting: Physical Therapy

## 2017-09-04 ENCOUNTER — Ambulatory Visit: Payer: Medicaid Other | Admitting: Occupational Therapy

## 2017-09-04 DIAGNOSIS — R41841 Cognitive communication deficit: Secondary | ICD-10-CM

## 2017-09-04 DIAGNOSIS — R41842 Visuospatial deficit: Secondary | ICD-10-CM

## 2017-09-04 DIAGNOSIS — R41840 Attention and concentration deficit: Secondary | ICD-10-CM | POA: Diagnosis not present

## 2017-09-04 DIAGNOSIS — R278 Other lack of coordination: Secondary | ICD-10-CM

## 2017-09-04 DIAGNOSIS — R41844 Frontal lobe and executive function deficit: Secondary | ICD-10-CM

## 2017-09-04 NOTE — Therapy (Signed)
[facility] [address] [place], [name], [address] Phone: [phone]   Fax:  [phone]  Occupational Therapy Treatment  Patient Details  Name: Justin Ball MRN: [id number] Date of Birth: [birth year] Referring Provider: Dr. Paulette Blanch Dam  Encounter Date: 09/04/2017      OT End of Session - 09/04/17 1411    Visit Number 6   Number of Visits 17   Date for OT Re-Evaluation 09/29/17   Authorization Type Aetna--awaiting insurance verification; (Medicaid pending, also applied for Medicare)   Authorization Time Period 25 visits combined (PT used 3)   Authorization - Visit Number 6   Authorization - Number of Visits 11   OT Start Time 1405   OT Stop Time 1445   OT Time Calculation (min) 40 min   Activity Tolerance Patient tolerated treatment well   Behavior During Therapy Flat affect      Past Medical History:  Diagnosis Date  . Acute renal insufficiency 07/12/2017  . Anxiety   . Bipolar 1 disorder (HCC)   . Depression   . HIV infection (HCC)   . Neuromuscular disorder (HCC)   . Scabies   . 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 Substance abuse   . Syphilis     Past Surgical History:  Procedure Laterality Date  . EXAMINATION UNDER ANESTHESIA N/A 12/28/2014   Procedure: EXAM UNDER ANESTHESIA;  Surgeon: Karie Soda, MD;  Location: [facility];  Service: General;  Laterality: N/A;  . FLOOR OF MOUTH BIOPSY N/A 09/16/2016   Procedure: BIOPSY OF ORAL ABCESS;  Surgeon: Newman Pies, MD;  [facility]: [facility];  Service: ENT;  Laterality: N/A;  . INCISION AND DRAINAGE ABSCESS N/A 09/16/2016   Procedure: INCISION AND DRAINAGE ABSCESS;  Surgeon: Newman Pies, MD;  Location: [facility];  Service: ENT;  Laterality: N/A;  . INCISION AND DRAINAGE PERIRECTAL ABSCESS  08/27/2010   Dr Carolynne Edouard  . INCISION AND DRAINAGE PERIRECTAL ABSCESS N/A 12/28/2014   Procedure: IRRIGATION AND DEBRIDEMENT PERIRECTAL ABSCESS;  Surgeon: Karie Soda, MD;  Location: [facility];   Service: General;  Laterality: N/A;  Fistula repair and ablation  . LAPAROSCOPIC REVISION VENTRICULAR-PERITONEAL (V-P) SHUNT N/A 06/12/2017   Procedure: LAPAROSCOPIC REVISION VENTRICULAR-PERITONEAL (V-P) SHUNT;  Surgeon: Kinsinger, De Blanch, MD;  [facility]: [facility];  Service: General;  Laterality: N/A;  . VENTRICULOPERITONEAL SHUNT N/A 06/12/2017   Procedure: SHUNT INSERTION VENTRICULAR-PERITONEAL;  Surgeon: [name], MD;  [facility]: [facility];  Service: Neurosurgery;  Laterality: N/A;    There were no vitals filed for this visit.      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 No/denies           Treatment: money management task with pt providing therapist with specific amounts of change, mod-max difficulty/ v.c Simple sandwich making task. Pt located items with mod v.c. He left the door to the refrigerator open and required prompts to close. Pt was able to spread peanut butter and jelly on bread with improved performance, however  he did not apply very much and he demonstrated improved ability to cut sandwich in half. [name] grandmother reports pt is not consistently performing at home. therapsit encouraged her to set several items out for pt and let him assemble his own sandwich.                     OT Short Term Goals - 08/28/17 1455      OT SHORT TERM GOAL #1   Time 4   Period Weeks   Status On-going      OT SHORT TERM GOAL #2   Title Pt will perform UB dressing consistently without cueing.   Time 4   Period Weeks   Status Achieved  removing shirt min A due to long hair     OT SHORT TERM GOAL #3   Title Pt will be able to choose/retrieve appropriate clothing without verbal prompts.   Time 4   Period Weeks   Status On-going  able to choose between 2 options     OT SHORT TERM GOAL #4   Title Pt will be able to fold laundry with set-up.   Time 4   Period Weeks   Status On-going  min verbal cues     OT SHORT TERM GOAL #5   Title Pt/caregiver will verbalize understanding of cognitive compensation strategies for ADLs/functional tasks to incr participation in ADLs.   Time 4   Period Weeks   Status On-going           OT Long Term Goals - 07/31/17 2113      OT LONG TERM GOAL #1   Title Pt/caregiver will be independent with updated HEP/functional tasks for home.-- check LTGs 09/29/17   Baseline dependent   Time 8   Period Weeks   Status New     OT LONG TERM GOAL #2   Title Pt will perform bathing/dressing without cueing (distant supervision).   Baseline min A, and prompts   Time 8   Period Weeks   Status New     OT LONG TERM GOAL #3   Title Pt will follow simple schedule/routine for ADLs with min cues.   Baseline pt needs cueing, assistance for initiation and throughout ADLs, decr attention/ability to follow directions   Time 8   Period Weeks   Status New     OT LONG TERM GOAL #4   Title Pt will perform simple snack prep with min cueing/supervision.   Baseline not performing/dependent   Time 8   Period Weeks   Status New     OT LONG TERM GOAL #5   Title Pt will perform simple home maintenance task with supervision/min cueing.   Baseline not performing/dependent   Time 8   Period Weeks   Status New               Plan - 09/04/17 1742    Clinical Impression Statement Pt is progressing slowly towards goals. He continues to demonstrate decreased  awareness of deficits. [name] grandmother reports pt is thinking about moving out with some friends. Therapsit discussed with pt and grandmother that therapist does not recommend this due to pt's cognitive deficits and pt's need for assistance with ADLs/IADLs..   Rehab Potential Fair   Current Impairments/barriers affecting progress: cognitive deficits, urinary catheter, severity of deficits   OT Frequency 2x / week   OT Duration 8 weeks   OT Treatment/Interventions Self-care/ADL training;Therapeutic exercise;DME and/or AE instruction;Building services engineerunctional Mobility Training;Therapeutic activities;Patient/family education;Cognitive remediation/compensation;Neuromuscular  education;Visual/perceptual remediation/compensation;Therapeutic exercises   Plan simple functional tasks, ADL strategies   Consulted and Agree with Plan of Care Patient;Family member/caregiver   Family Member Consulted grandmother--Sue      Patient will benefit from skilled therapeutic intervention in order to improve the following deficits and impairments:  Decreased balance, Impaired vision/preception, Decreased cognition, Decreased knowledge of precautions, Impaired perceived functional ability, Impaired UE functional use, Decreased strength, Decreased safety awareness, Decreased knowledge of use of DME, Decreased coordination, Decreased activity tolerance  Visit Diagnosis: Attention and concentration deficit  Visuospatial deficit  Other lack of coordination  Frontal lobe and executive function deficit    Problem List Patient Active Problem List   Diagnosis Date Noted  . Urinary retention 07/12/2017  . Acute renal insufficiency 07/12/2017  . Acute cystitis without hematuria   . Hypotension   . 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 Acute urinary retention 06/26/2017  . Staphylococcus epidermidis ventriculitis 06/26/2017  . Bacteremia due to Streptococcus pneumoniae 06/26/2017  . Bordetella pertussis  06/26/2017  . 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 Transaminitis   . Endotracheal tube present   . Status epilepticus (HCC)   . History of syphilis   . Tobacco abuse   . Meningoencephalitis   . 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 Acute respiratory failure (HCC)   . Seizure (HCC) 05/20/2017  . Peri-rectal abscess 03/06/2017  . 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 Anemia 09/12/2016  . 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 Bipolar disorder (HCC) 04/08/2013  . Rash and nonspecific skin eruption 10/10/2012    Ericca Labra 09/04/2017, 5:47 PM  [facility] [address] [place], [name], [address] Phone: [phone]   Fax:  [phone]  Name: Justin Ball MRN: [id number] Date of Birth: [birth year]

## 2017-09-05 ENCOUNTER — Ambulatory Visit: Payer: Medicaid Other | Admitting: Occupational Therapy

## 2017-09-05 ENCOUNTER — Ambulatory Visit: Payer: Medicaid Other

## 2017-09-05 NOTE — Patient Instructions (Signed)
  Please complete the assigned speech therapy homework prior to your next session and return it to the speech therapist at your next visit.  

## 2017-09-05 NOTE — Therapy (Signed)
[facility] [address] [place], [place], [address] Phone: [phone]   Fax:  [phone]  Speech Language Pathology Treatment  Patient Details  Name: Justin Ball MRN: [id number] Date of Birth: 07/26/1991 Referring Provider: Daiva EvesVan Dam, Remi Haggardornelius, MD  Encounter Date: 09/04/2017      End of Session - 09/05/17 1658    Visit Number 6   Number of Visits 20   Date for SLP Re-Evaluation 10/19/17   Authorization Type AETNA - 25 visits combined - HARD LIMIT   Authorization - Visit Number 6   Authorization - Number of Visits 8   SLP Start Time 1449   SLP Stop Time  1530   SLP Time Calculation (min) 41 min   Activity Tolerance Patient tolerated treatment well      Past Medical History:  Diagnosis Date  . Acute renal insufficiency 07/12/2017  . Anxiety   . Bipolar 1 disorder (HCC)   . Depression   . HIV infection (HCC)   . Neuromuscular disorder (HCC)   . Scabies   . 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 Substance abuse   . Syphilis     Past Surgical History:  Procedure Laterality Date  . EXAMINATION UNDER ANESTHESIA N/A 12/28/2014   Procedure: EXAM UNDER ANESTHESIA;  Surgeon: Karie SodaSteven Gross, MD;  [facility]: [facility];  Service: General;  Laterality: N/A;  . FLOOR OF MOUTH BIOPSY N/A 09/16/2016   Procedure: BIOPSY OF ORAL ABCESS;  Surgeon: Newman PiesSu Teoh, MD;  [facility]: [facility];  Service: ENT;  Laterality: N/A;  . INCISION AND DRAINAGE ABSCESS N/A 09/16/2016   Procedure: INCISION AND DRAINAGE ABSCESS;  Surgeon: Newman PiesSu Teoh, MD;  [facility];  Service: ENT;  Laterality: N/A;  . INCISION AND DRAINAGE PERIRECTAL ABSCESS  08/27/2010   Dr Carolynne Edouardoth  . INCISION AND DRAINAGE PERIRECTAL ABSCESS N/A 12/28/2014   Procedure: IRRIGATION AND DEBRIDEMENT PERIRECTAL ABSCESS;  Surgeon: Karie SodaSteven Gross, MD;  [facility]: [facility];  Service: General;  Laterality: N/A;  Fistula repair and ablation  . LAPAROSCOPIC REVISION VENTRICULAR-PERITONEAL (V-P) SHUNT N/A  06/12/2017   Procedure: LAPAROSCOPIC REVISION VENTRICULAR-PERITONEAL (V-P) SHUNT;  Surgeon: Kinsinger, De BlanchLuke Aaron, MD;  [facility]: [facility];  Service: General;  Laterality: N/A;  . VENTRICULOPERITONEAL SHUNT N/A 06/12/2017   Procedure: SHUNT INSERTION VENTRICULAR-PERITONEAL;  Surgeon: Lisbeth RenshawNundkumar, Neelesh, MD;  Location: [facility];  Service: Neurosurgery;  Laterality: N/A;    There were no vitals filed for this visit.      Subjective Assessment - 09/04/17 1452    Subjective "I know the day it is."   Patient is accompained by: Family member  grandmother Fannie KneeSue   Currently in Pain? No/denies               ADULT SLP TREATMENT - 09/05/17 0001      General Information   Behavior/Cognition Alert;Cooperative;Distractible;Requires cueing     Treatment Provided   Treatment provided Cognitive-Linquistic     Cognitive-Linquistic Treatment   Treatment focused on Cognition   Skilled Treatment Sustained attention task with counting dollars and coins - pt req'd max A (verbal, visual, written) consistently mostly due to decr'd sustained attention >30 seconds.  Pt engaged in simple verbal reasoning tasks with occasional min A.     Assessment / Recommendations / Plan   Plan Continue with current plan of care     Progression Toward Goals   Progression toward goals Progressing toward goals            SLP Short Term Goals -  09/05/17 1659      SLP SHORT TERM GOAL #1   Title pt will demo sustained attention for 5 minutes, in 3 different tasks completed 90% success, in 3 sessions   Time 2   Period Weeks   Status On-going     SLP SHORT TERM GOAL #2   Title pt will tell SLP 2 cognitive defict areas with min A occasionallly, in three sessions   Time 2   Period Weeks   Status On-going     SLP SHORT TERM GOAL #3   Title pt will demo orientation to year, month, date with modified independence (calendar)   Time 2   Period Weeks   Status On-going     SLP SHORT TERM GOAL #4   Title pt will demo  sustained attention to one task for 10 minutes to achieve 90% success   Time 2   Period Weeks   Status On-going          SLP Long Term Goals - 09/05/17 1659      SLP LONG TERM GOAL #1   Title pt will demonstrate sustained attention to one task for 15 mintues in order to achieve 90% success with rare nonverbal cues back to task, in two sessions   Time 7   Period Weeks  or 20 visits   Status On-going     SLP LONG TERM GOAL #2   Title pt will demo emergent awareness in a simple paper/pencil cognitive linguistic task 60% of the time given pt double checking responses   Time 7   Period Weeks  or 20 visits   Status On-going     SLP LONG TERM GOAL #3   Title pt will demo orientation to month, day, date with modified independence over 8 sessions   Time 7   Period Weeks  or 20 visits   Status On-going          Plan - 09/05/17 1659    Clinical Impression Statement Pt continues to exhibit moderate to severe deficits in orientation, attention, recall, and verbal expression. Please see "skilled intervention" for further details. Continued ST intervention is recommended to maximize cognitive linguistic skills for improved safety and independence, as well as decreased burden of care.   Speech Therapy Frequency 1x /week   Duration --  10 weeks or 20 visits   Treatment/Interventions Language facilitation;Compensatory techniques;Internal/external aids;SLP instruction and feedback;Cognitive reorganization;Environmental controls;Multimodal communcation approach;Functional tasks;Cueing hierarchy;Patient/family education   Potential to Achieve Goals Fair   Potential Considerations Severity of impairments;Cooperation/participation level;Family/community support;Ability to learn/carryover information   SLP Home Exercise Plan home tasks provided today   Consulted and Agree with Plan of Care Patient;Family member/caregiver   Family Member Consulted grandmother [name]      Patient will benefit  from skilled therapeutic intervention in order to improve the following deficits and impairments:   Cognitive communication deficit    Problem List Patient Active Problem List   Diagnosis Date Noted  . Urinary retention 07/12/2017  . Acute renal insufficiency 07/12/2017  . Acute cystitis without hematuria   . Hypotension   . 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 Acute urinary retention 06/26/2017  . Staphylococcus epidermidis ventriculitis 06/26/2017  . Bacteremia due to Streptococcus pneumoniae 06/26/2017  . Bordetella pertussis 06/26/2017  . 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 Transaminitis   . Endotracheal tube present   . Status epilepticus (HCC)   . History of  syphilis   . Tobacco abuse   . Meningoencephalitis   . 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 Acute respiratory failure (HCC)   . Seizure (HCC) 05/20/2017  . Peri-rectal abscess 03/06/2017  . 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 Anemia 09/12/2016  . 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 Bipolar disorder (HCC) 04/08/2013  . Rash and nonspecific skin eruption 10/10/2012    [facility] ,MS, CCC-SLP  09/05/2017, 5:00 PM  [facility] [address] [place], [name], [address] Phone: [phone]   Fax:  [phone]   Name: Justin Ball MRN: [id number] Date of Birth: Mar 13, 1991

## 2017-09-07 ENCOUNTER — Ambulatory Visit: Payer: No Typology Code available for payment source | Admitting: Neurology

## 2017-09-11 ENCOUNTER — Ambulatory Visit: Payer: Medicaid Other

## 2017-09-11 ENCOUNTER — Ambulatory Visit: Payer: No Typology Code available for payment source | Admitting: Physical Therapy

## 2017-09-11 ENCOUNTER — Ambulatory Visit: Payer: Medicaid Other | Admitting: Occupational Therapy

## 2017-09-12 ENCOUNTER — Ambulatory Visit (INDEPENDENT_AMBULATORY_CARE_PROVIDER_SITE_OTHER): Payer: No Typology Code available for payment source | Admitting: Diagnostic Neuroimaging

## 2017-09-12 ENCOUNTER — Encounter: Payer: No Typology Code available for payment source | Admitting: Occupational Therapy

## 2017-09-12 ENCOUNTER — Encounter: Payer: Self-pay | Admitting: Diagnostic Neuroimaging

## 2017-09-12 ENCOUNTER — Encounter: Payer: Self-pay | Admitting: Occupational Therapy

## 2017-09-12 ENCOUNTER — Ambulatory Visit: Payer: Medicaid Other | Admitting: Occupational Therapy

## 2017-09-12 ENCOUNTER — Encounter (INDEPENDENT_AMBULATORY_CARE_PROVIDER_SITE_OTHER): Payer: Self-pay

## 2017-09-12 ENCOUNTER — Telehealth: Payer: Self-pay

## 2017-09-12 VITALS — BP 105/70 | HR 90 | Ht 73.0 in | Wt 159.6 lb

## 2017-09-12 DIAGNOSIS — R41844 Frontal lobe and executive function deficit: Secondary | ICD-10-CM

## 2017-09-12 DIAGNOSIS — B451 Cerebral cryptococcosis: Secondary | ICD-10-CM

## 2017-09-12 DIAGNOSIS — B20 Human immunodeficiency virus [HIV] disease: Secondary | ICD-10-CM

## 2017-09-12 DIAGNOSIS — M6281 Muscle weakness (generalized): Secondary | ICD-10-CM

## 2017-09-12 DIAGNOSIS — G40909 Epilepsy, unspecified, not intractable, without status epilepticus: Secondary | ICD-10-CM | POA: Insufficient documentation

## 2017-09-12 DIAGNOSIS — R41842 Visuospatial deficit: Secondary | ICD-10-CM

## 2017-09-12 DIAGNOSIS — G40901 Epilepsy, unspecified, not intractable, with status epilepticus: Secondary | ICD-10-CM

## 2017-09-12 DIAGNOSIS — R41840 Attention and concentration deficit: Secondary | ICD-10-CM | POA: Diagnosis not present

## 2017-09-12 DIAGNOSIS — R2681 Unsteadiness on feet: Secondary | ICD-10-CM

## 2017-09-12 DIAGNOSIS — R278 Other lack of coordination: Secondary | ICD-10-CM

## 2017-09-12 MED FILL — levETIRAcetam 1000 MG TABS: 1000 | 30 days supply | Qty: 60 | Fill #1

## 2017-09-12 MED FILL — TIVICAY 50 MG TABLET: 50 | 30 days supply | Qty: 30 | Fill #2

## 2017-09-12 MED FILL — ODEFSEY 200-25-25 MG TABS: 200-25-25 | 30 days supply | Qty: 30 | Fill #2

## 2017-09-12 NOTE — Telephone Encounter (Signed)
Patient no longer has home health.   Mother will bring him for visit on 09-13-17.  Laurell Josephs, RN

## 2017-09-12 NOTE — Telephone Encounter (Signed)
Ok very good thanks Justin Ball 

## 2017-09-12 NOTE — Patient Instructions (Signed)
Thank you for coming to see [place] at [facility]. I hope we have been able to provide you high quality care today.  You may receive a patient satisfaction survey over the next few weeks. We would appreciate your feedback and comments so that we may continue to improve ourselves and the health of our patients.  - continue levetiracetam 1061m twice a day for seizure prevention   ~~~~~~~~~~~~~~~~~~~~~~~~~~~~~~~~~~~~~~~~~~~~~~~~~~~~~~~~~~~~~~~~~  DR. Truitt Cruey'S GUIDE TO HAPPY AND HEALTHY LIVING These are some of my general health and wellness recommendations. Some of them may apply to you better than others. Please use common sense as you try these suggestions and feel free to ask me any questions.   ACTIVITY/FITNESS Mental, social, emotional and physical stimulation are very important for brain and body health. Try learning a new activity (arts, music, language, sports, games).  Keep moving your body to the best of your abilities. You can do this at home, inside or outside, the park, community center, gym or anywhere you like. Consider a physical therapist or personal trainer to get started. Consider the app Sworkit. Fitness trackers such as smart-watches, smart-phones or Fitbits can help as well.   NUTRITION Eat more plants: colorful vegetables, nuts, seeds and berries.  Eat less sugar, salt, preservatives and processed foods.  Avoid toxins such as cigarettes and alcohol.  Drink water when you are thirsty. Warm water with a slice of lemon is an excellent morning drink to start the day.  Consider these websites for more information The Nutrition Source (hhttps://www.henry-hernandez.biz/ Precision Nutrition (wWindowBlog.ch   RELAXATION Consider practicing mindfulness meditation or other relaxation techniques such as deep breathing, prayer, yoga, tai chi, massage. See website mindful.org or the apps Headspace or Calm to help  get started.   SLEEP Try to get at least 7-8+ hours sleep per day. Regular exercise and reduced caffeine will help you sleep better. Practice good sleep hygeine techniques. See website sleep.org for more information.   PLANNING Prepare estate planning, living will, healthcare POA documents. Sometimes this is best planned with the help of an attorney. Theconversationproject.org and agingwithdignity.org are excellent resources.

## 2017-09-12 NOTE — Telephone Encounter (Signed)
Patients mother called with complaint of Justin Ball is having rectal irritation from excessive diarrhea.  She would like some creme if possible. His rectal area is red and raw.  Please advise.  Laurell Josephs, RN

## 2017-09-12 NOTE — Therapy (Signed)
[facility] [address] [place], [name], [address] Phone: [phone]   Fax:  [phone]  Occupational Therapy Treatment  Patient Details  Name: Justin Ball MRN: [id number] Date of Birth: 1991-09-24 Referring Provider: Dr. Paulette Blanch Dam  Encounter Date: 09/12/2017      OT End of Session - 09/12/17 1103    Visit Number 7   Number of Visits 17   Date for OT Re-Evaluation 09/29/17   Authorization Type [employer]--awaiting insurance verification; (Medicaid pending, also applied for Medicare)   Authorization Time Period 25 visits combined (PT used 3)   Authorization - Visit Number 7   Authorization - Number of Visits 11   OT Start Time 0930   OT Stop Time 1015   OT Time Calculation (min) 45 min   Activity Tolerance Patient tolerated treatment well   Behavior During Therapy [facility] for tasks assessed/performed      Past Medical History:  Diagnosis Date  . Acute renal insufficiency 07/12/2017  . Anxiety   . Bipolar 1 disorder (HCC)   . Depression   . HIV infection (HCC)   . Neuromuscular disorder (HCC)   . Scabies   . 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 Substance abuse   . Syphilis     Past Surgical History:  Procedure Laterality Date  . EXAMINATION UNDER ANESTHESIA N/A 12/28/2014   Procedure: EXAM UNDER ANESTHESIA;  Surgeon: Karie Soda, MD;  Location: [facility];  Service: General;  Laterality: N/A;  . FLOOR OF MOUTH BIOPSY N/A 09/16/2016   Procedure: BIOPSY OF ORAL ABCESS;  Surgeon: Newman Pies, MD;  [facility]: [facility];  Service: ENT;  Laterality: N/A;  . INCISION AND DRAINAGE ABSCESS N/A 09/16/2016   Procedure: INCISION AND DRAINAGE ABSCESS;  Surgeon: Newman Pies, MD;  [facility]: [facility];  Service: ENT;  Laterality: N/A;  . INCISION AND DRAINAGE PERIRECTAL ABSCESS  08/27/2010   Dr Carolynne Edouard  . INCISION AND DRAINAGE PERIRECTAL ABSCESS N/A 12/28/2014   Procedure: IRRIGATION AND DEBRIDEMENT PERIRECTAL ABSCESS;  Surgeon: Karie Soda,  MD;  [facility]: [facility];  Service: General;  Laterality: N/A;  Fistula repair and ablation  . LAPAROSCOPIC REVISION VENTRICULAR-PERITONEAL (V-P) SHUNT N/A 06/12/2017   Procedure: LAPAROSCOPIC REVISION VENTRICULAR-PERITONEAL (V-P) SHUNT;  Surgeon: Kinsinger, De Blanch, MD;  [facility]: [facility];  Service: General;  Laterality: N/A;  . VENTRICULOPERITONEAL SHUNT N/A 06/12/2017   Procedure: SHUNT INSERTION VENTRICULAR-PERITONEAL;  Surgeon: [name], MD;  [facility]: [facility] OR;  Service: Neurosurgery;  Laterality: N/A;    There were no vitals filed for this visit.                    OT Treatments/Exercises (OP) - 09/12/17 0001      ADLs   Home Maintenance Patient asked to replace batteries on clinic toy. With increased time patient able to locate latch, then identify correct tool to open latch - with step by step prompting to organize.  Patient able to locate correct number and type of batteries from 4 options,and replace in correct orientation.  Grandmother inquiring about ways to make patient use left hand, and explained that functional tasks that require use of two hands, and occasional gentle reminders seem to be effective.       Cognitive Exercises   Attention Span Sustained Addressed patient's ability to sustain his attention with simple familiar task- e.g sorting.  Patient needed max cueing initially to sort cards by suit.  Patient overtly cued verbally that concentration and remembering were  focus of therapy session.  Provided simple written cue to refer back to during today's session.  Patient better able to retain information using multimodal cueing - simply auditory cueing was not sufficient.  Patient able to attend to cards, but frequently lost focus of instruction - sort by suit.  Second round, patient with improved performance.  Patient able to switch mental set with new directions and max cueing to now sort in numerical order.  Working at end of session (mental fatigue) in  minimally distracting environement, this was too diffiicult for patient to complete.  Grandmother able to see change in performance when env't became more distracting.                  OT Education - 09/12/17 1102    Education provided Yes   Education Details Purpose of therapy is to improve concentration, and memory   Person(s) Educated Patient;Caregiver(s);Parent(s)   Methods Explanation;Demonstration;Verbal cues   Comprehension Need further instruction          OT Short Term Goals - 08/28/17 1455      OT SHORT TERM GOAL #1   Time 4   Period Weeks   Status On-going     OT SHORT TERM GOAL #2   Title Pt will perform UB dressing consistently without cueing.   Time 4   Period Weeks   Status Achieved  removing shirt min A due to long hair     OT SHORT TERM GOAL #3   Title Pt will be able to choose/retrieve appropriate clothing without verbal prompts.   Time 4   Period Weeks   Status On-going  able to choose between 2 options     OT SHORT TERM GOAL #4   Title Pt will be able to fold laundry with set-up.   Time 4   Period Weeks   Status On-going  min verbal cues     OT SHORT TERM GOAL #5   Title Pt/caregiver will verbalize understanding of cognitive compensation strategies for ADLs/functional tasks to incr participation in ADLs.   Time 4   Period Weeks   Status On-going           OT Long Term Goals - 07/31/17 2113      OT LONG TERM GOAL #1   Title Pt/caregiver will be independent with updated HEP/functional tasks for home.-- check LTGs 09/29/17   Baseline dependent   Time 8   Period Weeks   Status New     OT LONG TERM GOAL #2   Title Pt will perform bathing/dressing without cueing (distant supervision).   Baseline min A, and prompts   Time 8   Period Weeks   Status New     OT LONG TERM GOAL #3   Title Pt will follow simple schedule/routine for ADLs with min cues.   Baseline pt needs cueing, assistance for initiation and throughout ADLs, decr  attention/ability to follow directions   Time 8   Period Weeks   Status New     OT LONG TERM GOAL #4   Title Pt will perform simple snack prep with min cueing/supervision.   Baseline not performing/dependent   Time 8   Period Weeks   Status New     OT LONG TERM GOAL #5   Title Pt will perform simple home maintenance task with supervision/min cueing.   Baseline not performing/dependent   Time 8   Period Weeks   Status New  Plan - 09/12/17 1103    Clinical Impression Statement Patient is showing improved engagement, and some emergent awareness of deficits.  Patient has severe attention and memory deficits which limit his ability to be left alone for any length of time.  Mom and grandma are looking into day programs for safe and effective appropriate stimualtion outside of his home.     Rehab Potential Fair   Current Impairments/barriers affecting progress: cognitive deficits, urinary catheter, severity of deficits   OT Duration 8 weeks   OT Treatment/Interventions Self-care/ADL training;Therapeutic exercise;DME and/or AE instruction;Building services engineer;Therapeutic activities;Patient/family education;Cognitive remediation/compensation;Neuromuscular education;Visual/perceptual remediation/compensation;Therapeutic exercises   Plan Patient may benefit from an orientation book to be used between therapy and home.  quiet environment, simple one step familiar task, patient does well with multimodal cues and repetition.     Consulted and Agree with Plan of Care Patient;Family member/caregiver   Family Member Consulted grandmother--Sue, Mom      Patient will benefit from skilled therapeutic intervention in order to improve the following deficits and impairments:  Decreased balance, Impaired vision/preception, Decreased cognition, Decreased knowledge of precautions, Impaired perceived functional ability, Impaired UE functional use, Decreased strength, Decreased  safety awareness, Decreased knowledge of use of DME, Decreased coordination, Decreased activity tolerance  Visit Diagnosis: Attention and concentration deficit  Visuospatial deficit  Other lack of coordination  Frontal lobe and executive function deficit  Unsteadiness on feet  Muscle weakness (generalized)    Problem List Patient Active Problem List   Diagnosis Date Noted  . Seizure disorder (HCC) 09/12/2017  . Urinary retention 07/12/2017  . Acute renal insufficiency 07/12/2017  . Acute cystitis without hematuria   . Hypotension   . 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 Acute urinary retention 06/26/2017  . Staphylococcus epidermidis ventriculitis 06/26/2017  . Bacteremia due to Streptococcus pneumoniae 06/26/2017  . Bordetella pertussis 06/26/2017  . 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 Transaminitis   . Endotracheal tube present   . Status epilepticus (HCC)   . History of syphilis   . Tobacco abuse   . Meningoencephalitis   . 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 Acute respiratory failure (HCC)   . Seizure (HCC) 05/20/2017  . Peri-rectal abscess 03/06/2017  . 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 Anemia 09/12/2016  . 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 Bipolar disorder (HCC) 04/08/2013  . Rash and nonspecific skin eruption 10/10/2012    Collier Salina, OTR/L 09/12/2017, 11:10 AM  [facility] [address] [place], [name], [address] Phone: [phone]   Fax:  [phone]  Name: Justin Ball MRN: [id number] Date of Birth: 1991/11/30

## 2017-09-12 NOTE — Progress Notes (Signed)
[facility]  PATIENT: Justin Ball DOB: [birth year]  REFERRING CLINICIAN: Karlyne Greenspan, MD HISTORY FROM: patient, mother, grandmother, chart review REASON FOR VISIT: new consult    HISTORICAL  CHIEF COMPLAINT:  Chief Complaint  Patient presents with  . NP Salma  . neurosyphilis    mother and grandmother here with pt.  Having memory / cog nitive issues, deficits from brain infection    HISTORY OF PRESENT ILLNESS:   26 year old male with history of HIV, cryptococcal meningitis with seizures, complicated by intracranial hypertension and uncal herniation, requiring external ventricular drain, then requiring VP shunt. 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 Patient had prolonged hospitalization from May - July 2018 for these problems.  Patient presents here for follow-up and evaluation at request of patient's family. They're concerned about his cognitive deficits, risk for recurrence, and whether any further testing evaluation needs to be done related to his intraventricular shunt.  Patient has not had any further seizures. He is tolerating medications. He has made some gradual improvement in his neurologic status since July 2018.   REVIEW OF SYSTEMS: Full 14 system review of systems performed and negative with exception of: Memory loss slurred speech seizure tremor feeling cold easy bruising urination problems allergies.  ALLERGIES: Allergies  Allergen Reactions  . No Known Allergies     HOME MEDICATIONS: Outpatient Medications Prior to Visit  Medication Sig Dispense Refill  . acetaminophen (TYLENOL) 325 MG tablet Take 2 tablets (650 mg total) by mouth every 4 (four) hours as needed for mild pain (temp > 101.5).    Marland Kitchen azithromycin (ZITHROMAX) 600 MG tablet Take 2 tablets (1,200 mg total) by mouth once a week. 8 tablet 4  . ciprofloxacin (CIPRO) 500 MG tablet Take 1 tablet (500 mg total) by  mouth every 12 (twelve) hours. 10 tablet 0  . dolutegravir (TIVICAY) 50 MG tablet Take 1 tablet (50 mg total) by mouth daily. 30 tablet 6  . emtricitabine-rilpivir-tenofovir AF (ODEFSEY) 200-25-25 MG TABS tablet Take 1 tablet by mouth daily with breakfast. 30 tablet 6  . feeding supplement, ENSURE ENLIVE, (ENSURE ENLIVE) LIQD Take 237 mLs by mouth 3 (three) times daily between meals. 237 mL 12  . fluconazole (DIFLUCAN) 200 MG tablet Take 2 tablets (400 mg total) by mouth daily. 60 tablet 3  . levETIRAcetam (KEPPRA) 1000 MG tablet Take 1 tablet (1,000 mg total) by mouth 2 (two) times daily. 60 tablet 5  . QUEtiapine (SEROQUEL) 25 MG tablet Take 1 tablet (25 mg total) by mouth 2 (two) times daily as needed (agitation). 60 tablet 4  . sulfamethoxazole-trimethoprim (BACTRIM,SEPTRA) 400-80 MG tablet Take 1 tablet by mouth 3 (three) times a week. 12 tablet 5  . Water For Irrigation, Sterile (FREE WATER) SOLN Take 400 mLs by mouth every 8 (eight) hours.     No facility-administered medications prior to visit.     PAST MEDICAL HISTORY: Past Medical History:  Diagnosis Date  . Acute renal insufficiency 07/12/2017  . Anxiety   . Bipolar 1 disorder (HCC)   . Depression   . HIV infection (HCC)   . Neuromuscular disorder (HCC)   . Scabies   . 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 Substance abuse   . Syphilis     PAST SURGICAL HISTORY: Past Surgical History:  Procedure Laterality Date  . EXAMINATION UNDER ANESTHESIA N/A 12/28/2014   Procedure: EXAM UNDER ANESTHESIA;  Surgeon: Karie Soda, MD;  [facility]: [facility];  Service: General;  Laterality: N/A;  . FLOOR OF MOUTH BIOPSY N/A 09/16/2016   Procedure: BIOPSY OF ORAL ABCESS;  Surgeon: Newman Pies, MD;  [facility]: [facility];  Service: ENT;  Laterality: N/A;  . INCISION AND DRAINAGE ABSCESS N/A 09/16/2016   Procedure: INCISION AND DRAINAGE ABSCESS;  Surgeon: Newman Pies, MD;  [facility]: [facility];  Service: ENT;  Laterality: N/A;  . INCISION AND DRAINAGE PERIRECTAL  ABSCESS  08/27/2010   Dr Carolynne Edouard  . INCISION AND DRAINAGE PERIRECTAL ABSCESS N/A 12/28/2014   Procedure: IRRIGATION AND DEBRIDEMENT PERIRECTAL ABSCESS;  Surgeon: Karie Soda, MD;  [facility]: [facility];  Service: General;  Laterality: N/A;  Fistula repair and ablation  . LAPAROSCOPIC REVISION VENTRICULAR-PERITONEAL (V-P) SHUNT N/A 06/12/2017   Procedure: LAPAROSCOPIC REVISION VENTRICULAR-PERITONEAL (V-P) SHUNT;  Surgeon: Kinsinger, De Blanch, MD;  [facility]: [facility];  Service: General;  Laterality: N/A;  . VENTRICULOPERITONEAL SHUNT N/A 06/12/2017   Procedure: SHUNT INSERTION VENTRICULAR-PERITONEAL;  Surgeon: [name], MD;  Location: [facility];  Service: Neurosurgery;  Laterality: N/A;    FAMILY HISTORY: Family History  Problem Relation Age of Onset  . Hypertension Other     SOCIAL HISTORY:  Social History   Social History  . Marital status: Single    Spouse name: N/A  . Number of children: N/A  . Years of education: N/A   Occupational History  . Not on file.   Social History Main Topics  . Smoking status: Current Every Day Smoker    Packs/day: 0.50    Years: 5.00    Types: Cigarettes  . Smokeless tobacco: Never Used  . Alcohol use 0.0 oz/week     Comment: occasionally   . Drug use: No  . Sexual activity: Yes    Partners: Male    Birth control/ protection: Condom     Comment: irregular condom use; educated   Other Topics Concern  . Not on file   Social History Narrative  . No narrative on file     PHYSICAL EXAM GENERAL EXAM/CONSTITUTIONAL: Vitals:  Vitals:   09/12/17 0748  BP: 105/70  Pulse: 90  Weight: 159 lb 9.6 oz (72.4 kg)  Height:  (1.854 m)     Body mass index is 21.06 kg/m.  Visual Acuity Screening   Right eye Left eye Both eyes  Without correction: 20/100 20/70   With correction:        Patient is in no distress; well developed, nourished and groomed; neck is supple  CARDIOVASCULAR:  Examination of carotid arteries is normal; no carotid  bruits  Regular rate and rhythm, no murmurs  Examination of peripheral vascular system by observation and palpation is normal  EYES:  Ophthalmoscopic exam of optic discs and posterior segments is normal; no papilledema or hemorrhages  MUSCULOSKELETAL:  Gait, strength, tone, movements noted in Neurologic exam below  NEUROLOGIC: MENTAL STATUS:  MMSE - Mini Mental State Exam 09/12/2017  Orientation to time 3  Orientation to Place 4  Registration 3  Attention/ Calculation 0  Recall 2  Language- name 2 objects 2  Language- repeat 1  Language- follow 3 step command 3  Language- read & follow direction 1  Write a sentence 1  Copy design 0  Total score 20    awake, alert, oriented to person  DECR recent and remote memory intact  DECR  attention and concentration  DECR FLUENCY; comprehension intact, naming intact  fund of knowledge appropriate  CRANIAL NERVE:   2nd - no papilledema on fundoscopic exam  2nd, 3rd, 4th,  6th - pupils equal and reactive to light, visual fields full to confrontation, extraocular muscles intact, no nystagmus  5th - facial sensation symmetric  7th - facial strength symmetric  8th - hearing intact  9th - palate elevates symmetrically, uvula midline  11th - shoulder shrug symmetric  12th - tongue protrusion midline  MOTOR:   normal bulk and tone, full strength in the BUE, BLE  SENSORY:   normal and symmetric to light touch, temperature, vibration  COORDINATION:   finger-nose-finger, fine finger movements normal  REFLEXES:   deep tendon reflexes present and symmetric  GAIT/STATION:   narrow based gait    DIAGNOSTIC DATA (LABS, IMAGING, TESTING) - I reviewed patient records, labs, notes, testing and imaging myself where available.  Lab Results  Component Value Date   WBC 2.3 (L) 08/02/2017   HGB 11.0 (L) 08/02/2017   HCT 33.0 (L) 08/02/2017   MCV 87.5 08/02/2017   PLT 307 08/02/2017      Component Value Date/Time    NA 139 08/02/2017 1404   K 3.7 08/02/2017 1404   CL 109 08/02/2017 1404   CO2 17 (L) 08/02/2017 1404   GLUCOSE 62 (L) 08/02/2017 1404   BUN 17 08/02/2017 1404   CREATININE 1.14 08/02/2017 1404   CALCIUM 9.0 08/02/2017 1404   PROT 8.1 08/02/2017 1404   ALBUMIN 3.6 08/02/2017 1404   AST 48 (H) 08/02/2017 1404   ALT 46 08/02/2017 1404   ALT 38 07/10/2017 1152   ALKPHOS 165 (H) 08/02/2017 1404   BILITOT 0.3 08/02/2017 1404   GFRNONAA 88 08/02/2017 1404   GFRAA >89 08/02/2017 1404   Lab Results  Component Value Date   CHOL 125 09/28/2016   HDL 31 (L) 09/28/2016   LDLCALC 70 09/28/2016   TRIG 150 (H) 05/26/2017   CHOLHDL 4.0 09/28/2016   No results found for: HGBA1C Lab Results  Component Value Date   VITAMINB12 1,277 (H) 09/12/2016   No results found for: TSH   05/25/17 MRI brain 1. Multifocal cortical diffusion restriction involving both cerebellar hemispheres and the bilateral temporal, occipital and frontal lobes, as well as the deep gray nuclei. 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 Postictal changes could have a similar appearance, but the pattern is more typical of hypoxic ischemic injury. 2. Areas of subarachnoid or leptomeningeal contrast enhancement at the sites of diffusion restriction may be secondary to blood-brain barrier breakdown in the setting of a subacute ischemic event. In the context of HIV, however, an infectious meningitis should also be considered. 3. Small amount of blood adjacent to the right frontal approach extraventricular drain.    ASSESSMENT AND PLAN  26 y.o. 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   Dx:  1. Cryptococcal meningitis (HCC)   2. Status epilepticus (HCC)   3. Seizure disorder (HCC)   4. 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    Suanne Marker, MD 09/12/2017, 8:24  AM Certified in Neurology, Neurophysiology and [facility] [address] [place], [place] [address] [phone]

## 2017-09-12 NOTE — Telephone Encounter (Signed)
This is something wound care RN might have expertise in. I assume Justin Ball is home and not at a SNF. Maybe he also needs to be checked for C difficile. Does he have home health? I am not sure what to prescribe for his skin

## 2017-09-13 ENCOUNTER — Ambulatory Visit (INDEPENDENT_AMBULATORY_CARE_PROVIDER_SITE_OTHER): Payer: No Typology Code available for payment source | Admitting: Infectious Disease

## 2017-09-13 ENCOUNTER — Encounter: Payer: Self-pay | Admitting: Infectious Disease

## 2017-09-13 ENCOUNTER — Other Ambulatory Visit: Payer: Self-pay | Admitting: Infectious Disease

## 2017-09-13 VITALS — BP 123/73 | HR 99 | Temp 98.8°F | Ht 72.0 in | Wt 162.0 lb

## 2017-09-13 DIAGNOSIS — D013 Carcinoma in situ of anus and anal canal: Secondary | ICD-10-CM

## 2017-09-13 DIAGNOSIS — R338 Other retention of urine: Secondary | ICD-10-CM | POA: Diagnosis not present

## 2017-09-13 DIAGNOSIS — B180 Chronic viral hepatitis B with delta-agent: Secondary | ICD-10-CM

## 2017-09-13 DIAGNOSIS — R7881 Bacteremia: Secondary | ICD-10-CM

## 2017-09-13 DIAGNOSIS — B20 Human immunodeficiency virus [HIV] disease: Secondary | ICD-10-CM

## 2017-09-13 DIAGNOSIS — B451 Cerebral cryptococcosis: Secondary | ICD-10-CM

## 2017-09-13 DIAGNOSIS — Z23 Encounter for immunization: Secondary | ICD-10-CM

## 2017-09-13 DIAGNOSIS — G40909 Epilepsy, unspecified, not intractable, without status epilepticus: Secondary | ICD-10-CM | POA: Diagnosis not present

## 2017-09-13 DIAGNOSIS — B953 Streptococcus pneumoniae as the cause of diseases classified elsewhere: Secondary | ICD-10-CM

## 2017-09-13 DIAGNOSIS — Z982 Presence of cerebrospinal fluid drainage device: Secondary | ICD-10-CM | POA: Diagnosis not present

## 2017-09-13 LAB — CBC WITH DIFFERENTIAL/PLATELET
BASOS ABS: 30 {cells}/uL (ref 0–200)
Basophils Relative: 1.1 %
EOS PCT: 2.6 %
Eosinophils Absolute: 70 cells/uL (ref 15–500)
HEMATOCRIT: 31.6 % — AB (ref 38.5–50.0)
HEMOGLOBIN: 10.9 g/dL — AB (ref 13.2–17.1)
Lymphs Abs: 1326 cells/uL (ref 850–3900)
MCH: 29.9 pg (ref 27.0–33.0)
MCHC: 34.5 g/dL (ref 32.0–36.0)
MCV: 86.6 fL (ref 80.0–100.0)
MPV: 9.5 fL (ref 7.5–12.5)
Monocytes Relative: 13.3 %
NEUTROS ABS: 915 {cells}/uL — AB (ref 1500–7800)
NEUTROS PCT: 33.9 %
PLATELETS: 366 10*3/uL (ref 140–400)
RBC: 3.65 10*6/uL — ABNORMAL LOW (ref 4.20–5.80)
RDW: 13.6 % (ref 11.0–15.0)
Total Lymphocyte: 49.1 %
WBC mixed population: 359 cells/uL (ref 200–950)
WBC: 2.7 10*3/uL — ABNORMAL LOW (ref 3.8–10.8)

## 2017-09-13 LAB — COMPLETE METABOLIC PANEL WITH GFR
AG Ratio: 0.8 (calc) — ABNORMAL LOW (ref 1.0–2.5)
ALBUMIN MSPROF: 3.8 g/dL (ref 3.6–5.1)
ALT: 27 U/L (ref 9–46)
AST: 25 U/L (ref 10–40)
Alkaline phosphatase (APISO): 212 U/L — ABNORMAL HIGH (ref 40–115)
BUN: 10 mg/dL (ref 7–25)
CALCIUM: 9 mg/dL (ref 8.6–10.3)
CHLORIDE: 104 mmol/L (ref 98–110)
CO2: 26 mmol/L (ref 20–32)
CREATININE: 0.92 mg/dL (ref 0.60–1.35)
GFR, EST AFRICAN AMERICAN: 133 mL/min/{1.73_m2} (ref >=60)
GFR, Est Non African American: 114 mL/min/{1.73_m2} (ref >=60)
GLUCOSE: 70 mg/dL (ref 65–99)
Globulin: 4.5 g/dL (calc) — ABNORMAL HIGH (ref 1.9–3.7)
Potassium: 3.8 mmol/L (ref 3.5–5.3)
Sodium: 135 mmol/L (ref 135–146)
TOTAL PROTEIN: 8.3 g/dL — AB (ref 6.1–8.1)
Total Bilirubin: 0.4 mg/dL (ref 0.2–1.2)

## 2017-09-13 MED ORDER — SULFAMETHOXAZOLE-TRIMETHOPRIM 800-160 MG PO TABS: 1.0000 | ORAL_TABLET | ORAL | 6 refills | Status: DC

## 2017-09-13 MED ORDER — PERIGUARD EX OINT: 1.0000 "application " | TOPICAL_OINTMENT | CUTANEOUS | 2 refills | Status: DC | PRN

## 2017-09-13 MED FILL — FLUCONAZOLE 200 MG TABLET: 200 | 30 days supply | Qty: 60 | Fill #1

## 2017-09-13 MED FILL — SULFAMETHOXAZOLE-TMP DS TAB: 800-160 | 28 days supply | Qty: 12 | Fill #0

## 2017-09-13 NOTE — Progress Notes (Signed)
Subjective:   Chief complaint: "my butt hurts"   Patient ID: Justin Ball, male    DOB: July 04, 1991, 26 y.o.   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 He has been on fluconazole but I learned from [facility] that he had not picked up rx for fluconazole since July because his mother did not "ask for this" vs the specialty items which pharmacy prompts pt to fill.  He is without headaches or other symptoms to suggest recurrence. He is having about 5 bm per day which is causing pain in his rectal area.  He is on ODEFSEY and Tivicay which he is taking with meals. His virus has come under control. CD4 was at 50 when last checked.  HBV was coming down when last checked.  Lab Results  Component Value Date   HIV1RNAQUANT 66 (H) 08/02/2017   HIV1RNAQUANT 588 (H) 07/10/2017   HIV1RNAQUANT 83,400 05/20/2017   Lab Results  Component Value Date   CD4TABS 50 (L) 08/02/2017   CD4TABS  07/10/2017    THIS TEST WAS RESULTED IN ERROR. PLEASE DISREGARD RESULTS.   CD4TABS 30 (L) 07/10/2017   Past Medical History:  Diagnosis Date  . Acute renal insufficiency 07/12/2017  . Anxiety   . Bipolar 1 disorder (HCC)   . Depression   . HIV infection (HCC)   . Neuromuscular disorder (HCC)   . Scabies   . 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 Substance abuse   . Syphilis     Past Surgical History:  Procedure Laterality Date  . EXAMINATION UNDER ANESTHESIA N/A 12/28/2014   Procedure: EXAM UNDER ANESTHESIA;  Surgeon: Karie Soda, MD;  [facility]: [facility];  Service: General;  Laterality: N/A;  . FLOOR OF MOUTH BIOPSY N/A 09/16/2016   Procedure: BIOPSY OF ORAL ABCESS;  Surgeon: Newman Pies, MD;  [facility];  Service: ENT;  Laterality: N/A;  . INCISION AND DRAINAGE ABSCESS N/A 09/16/2016     Procedure: INCISION AND DRAINAGE ABSCESS;  Surgeon: Newman Pies, MD;  [facility];  Service: ENT;  Laterality: N/A;  . INCISION AND DRAINAGE PERIRECTAL ABSCESS  08/27/2010   Dr Carolynne Edouard  . INCISION AND DRAINAGE PERIRECTAL ABSCESS N/A 12/28/2014   Procedure: IRRIGATION AND DEBRIDEMENT PERIRECTAL ABSCESS;  Surgeon: Karie Soda, MD;  [facility];  Service: General;  Laterality: N/A;  Fistula repair and ablation  . LAPAROSCOPIC REVISION VENTRICULAR-PERITONEAL (V-P) SHUNT N/A 06/12/2017   Procedure: LAPAROSCOPIC REVISION VENTRICULAR-PERITONEAL (V-P) SHUNT;  Surgeon: Kinsinger, De Blanch, MD;  [facility];  Service: General;  Laterality: N/A;  . VENTRICULOPERITONEAL SHUNT N/A 06/12/2017   Procedure: SHUNT INSERTION VENTRICULAR-PERITONEAL;  Surgeon: [name], MD;  Location[facility];  Service: Neurosurgery;  Laterality: N/A;    Family History  Problem Relation Age of Onset  . Hypertension Other       Social History   Social History  . Marital status: Single    Spouse name: N/A  . Number of children: N/A  . Years of education: N/A   Social History Main Topics  . Smoking status: Current Every Day Smoker    Packs/day: 0.50    Years: 5.00    Types: Cigarettes  . Smokeless tobacco: Never Used     Comment: cutting back  . Alcohol use No  .  Drug use: No  . Sexual activity: Yes    Partners: Male    Birth control/ protection: Condom     Comment: irregular condom use; educated   Other Topics Concern  . None   Social History Narrative  . None    Allergies  Allergen Reactions  . No Known Allergies      Current Outpatient Prescriptions:  .  acetaminophen (TYLENOL) 325 MG tablet, Take 2 tablets (650 mg total) by mouth every 4 (four) hours as needed for mild pain (temp > 101.5)., Disp: , Rfl:  .  dolutegravir (TIVICAY) 50 MG tablet, Take 1 tablet (50 mg total) by mouth daily., Disp: 30 tablet, Rfl: 6 .  emtricitabine-rilpivir-tenofovir AF (ODEFSEY) 200-25-25 MG TABS  tablet, Take 1 tablet by mouth daily with breakfast., Disp: 30 tablet, Rfl: 6 .  fluconazole (DIFLUCAN) 200 MG tablet, Take 2 tablets (400 mg total) by mouth daily., Disp: 60 tablet, Rfl: 3 .  levETIRAcetam (KEPPRA) 1000 MG tablet, Take 1 tablet (1,000 mg total) by mouth 2 (two) times daily., Disp: 60 tablet, Rfl: 5 .  QUEtiapine (SEROQUEL) 25 MG tablet, Take 1 tablet (25 mg total) by mouth 2 (two) times daily as needed (agitation)., Disp: 60 tablet, Rfl: 4 .  Skin Protectants, Misc. (PERIGUARD) OINT, Apply 1 application topically as needed., Disp: 3 Tube, Rfl: 2 .  [START ON 09/14/2017] sulfamethoxazole-trimethoprim (BACTRIM DS,SEPTRA DS) 800-160 MG tablet, Take 1 tablet by mouth 3 (three) times a week., Disp: 12 tablet, Rfl: 6   Review of Systems  Unable to perform ROS: Dementia       Objective:   Physical Exam  Constitutional: No distress.  HENT:  Head: Normocephalic.  Mouth/Throat: Oropharynx is clear and moist. No oropharyngeal exudate.  Eyes: EOM are normal. No scleral icterus.  Neck: Normal range of motion. No JVD present. No tracheal deviation present.  Cardiovascular: Normal rate and regular rhythm.   Pulmonary/Chest: Effort normal. No respiratory distress. He has no wheezes.  Abdominal: Soft. Bowel sounds are normal. He exhibits no distension.  Musculoskeletal: Normal range of motion. He exhibits no edema or deformity.  Neurological: He is alert. He has normal strength. No cranial nerve deficit or sensory deficit. Gait normal.  Oriented to person and place  Skin: No rash noted. He is not diaphoretic. No erythema.   GU: he had exquisite pain with my attempts to examine his peri-rectal area. I could not palpate and abscess. There was exquisite tenderness of the skin       Assessment & Plan:    Perirectal irritation: the assumption is that this is irritation due to sitting in stool 'diaper rash" but stools are USUALLY formed.   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 I will ask Ambre to visit him and make sure his pillboxes are filled with correct meds. We will see if other home health services might be available  I spent WELL OVER 40 minutes with the patient including greater than 50% of time in face to face counsel of the patient and his mother re how he is to correctly take his medications, ree his HIV/AIDS, crypto meningoencphalitis, seizures and in coordination of his care with [facility] pharmacy here, [facility], [facility] and  [place]

## 2017-09-14 ENCOUNTER — Telehealth: Payer: Self-pay | Admitting: *Deleted

## 2017-09-14 LAB — T-HELPER CELL (CD4) - (RCID CLINIC ONLY)
CD4 % Helper T Cell: 8 % — ABNORMAL LOW (ref 33–55)
CD4 T Cell Abs: 110 /uL — ABNORMAL LOW (ref 400–2700)

## 2017-09-14 NOTE — Telephone Encounter (Signed)
Contacted Ms. Justin Ball and made her aware of my role at the clinic and that I would love to offer assistance. We discussed options for managing [name]'s rectal rash and I explained to her that a Balmax with a high level of zinc can assist with coating his bottom to prevent further breakdown and allow for healing. Ms. Justin Ball said she went and got a over the counter medication for diaper rash and will be picking the cream up from the pharmacy the Dr [name] prescribed. We discussed again my role and [name] asked if I could possible help her with getting incontinent supplies for Justin Ball. I agreed to help and asked if I could come over today for consents to be signed. Ms. Justin Ball stated she is at work and will not be home to after 5 (works 7:30 to 4:30 and has to go by the pharmacy today). Mr Justin Ball stated Justin Ball stays with his grandmother during the day while she works but we can meet on Monday at 5pm. I scheduled that time for consent to be signed.

## 2017-09-17 ENCOUNTER — Other Ambulatory Visit: Payer: No Typology Code available for payment source | Admitting: *Deleted

## 2017-09-18 ENCOUNTER — Ambulatory Visit: Payer: Medicaid Other | Admitting: *Deleted

## 2017-09-18 ENCOUNTER — Ambulatory Visit: Payer: Medicaid Other | Admitting: Speech Pathology

## 2017-09-18 ENCOUNTER — Ambulatory Visit: Payer: Medicaid Other | Admitting: Occupational Therapy

## 2017-09-19 ENCOUNTER — Ambulatory Visit: Payer: Medicaid Other | Admitting: Occupational Therapy

## 2017-09-19 DIAGNOSIS — R278 Other lack of coordination: Secondary | ICD-10-CM

## 2017-09-19 DIAGNOSIS — R41840 Attention and concentration deficit: Secondary | ICD-10-CM

## 2017-09-19 DIAGNOSIS — R41842 Visuospatial deficit: Secondary | ICD-10-CM

## 2017-09-19 DIAGNOSIS — R41844 Frontal lobe and executive function deficit: Secondary | ICD-10-CM

## 2017-09-19 NOTE — Addendum Note (Signed)
Addended by: Willa Frater D on: 09/19/2017 12:27 PM   Modules accepted: Orders

## 2017-09-19 NOTE — Therapy (Signed)
[facility] [address] [place], [name], [address] Phone: [phone]   Fax:  [phone]  Occupational Therapy Treatment  Patient Details  Name: Justin Ball MRN: [id number] Date of Birth: 1991/06/24 Referring Provider: Dr. Paulette Blanch Dam  Encounter Date: 09/19/2017      OT End of Session - 09/19/17 1443    Visit Number 8   Number of Visits 21   Date for OT Re-Evaluation 10/12/17   Authorization Type [employer]--25 visits combined hard limit; Grandmother reports Medicaid approved 09/18/17--awaiting authorization   Authorization Time Period 25 visits combined (PT used 3)   Authorization - Visit Number 8   Authorization - Number of Visits 11   OT Start Time 1322   OT Stop Time 1410   OT Time Calculation (min) 48 min   Activity Tolerance Patient tolerated treatment well   Behavior During Therapy WFL for tasks assessed/performed      Past Medical History:  Diagnosis Date  . Acute renal insufficiency 07/12/2017  . Anxiety   . Bipolar 1 disorder (HCC)   . Depression   . HIV infection (HCC)   . Neuromuscular disorder (HCC)   . Scabies   . 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 Substance abuse   . Syphilis     Past Surgical History:  Procedure Laterality Date  . EXAMINATION UNDER ANESTHESIA N/A 12/28/2014   Procedure: EXAM UNDER ANESTHESIA;  Surgeon: Karie Soda, MD;  [facility];  Service: General;  Laterality: N/A;  . FLOOR OF MOUTH BIOPSY N/A 09/16/2016   Procedure: BIOPSY OF ORAL ABCESS;  Surgeon: Newman Pies, MD;  [facility];  Service: ENT;  Laterality: N/A;  . INCISION AND DRAINAGE ABSCESS N/A 09/16/2016   Procedure: INCISION AND DRAINAGE ABSCESS;  Surgeon: Newman Pies, MD;  [facility];  Service: ENT;  Laterality: N/A;  . INCISION AND DRAINAGE PERIRECTAL ABSCESS  08/27/2010   Dr Carolynne Edouard  . INCISION AND DRAINAGE PERIRECTAL ABSCESS N/A 12/28/2014   Procedure: IRRIGATION AND DEBRIDEMENT PERIRECTAL ABSCESS;   Surgeon: Karie Soda, MD;  [facility];  Service: General;  Laterality: N/A;  Fistula repair and ablation  . LAPAROSCOPIC REVISION VENTRICULAR-PERITONEAL (V-P) SHUNT N/A 06/12/2017   Procedure: LAPAROSCOPIC REVISION VENTRICULAR-PERITONEAL (V-P) SHUNT;  Surgeon: Kinsinger, De Blanch, MD;  [facility];  Service: General;  Laterality: N/A;  . VENTRICULOPERITONEAL SHUNT N/A 06/12/2017   Procedure: SHUNT INSERTION VENTRICULAR-PERITONEAL;  Surgeon: [name], MD;  [facility];  Service: Neurosurgery;  Laterality: N/A;    There were no vitals filed for this visit.      Subjective Assessment - 09/19/17 1439    Subjective  pt reports that he has been making ham or peanut butter sandwiches at home, vacuuming, making bed, and gathering trash.   Pertinent History Hospitalized 5/27-06/27/17 with cryptococcal meningitis/encephalitis/neurosyphillis and VP shunt placement; AIDS, Hep B, hx of syphilis, bipolar disorder, seizure hx, anemia, urinary catheter, staphylococcus epidermidis, ventriculitis, hypotension, renal insuffiency, hx of substance abuse   Limitations AIDS, Hep B, hx of syphilis, seizure hx, s/p VP shunt, urinary catheter, cognitive deficits   Patient Stated Goals be able to take care of myself   Currently in Pain? No/denies      Grandmother reports that Monia Pouch is still current, but that pt has been approved for Medicaid (requested OT eval be submitted for Medicaid).  Explained that Medicaid requires prior authorization and waiting period for this as well as process where Medicaid authorizes visit # and date.  Grandmother  verbalized understanding.   Orientation:  Initially questioned pt regarding day/date and pt could state day, but stated 28th instead of 26th.  Then pt given calendar to assist and needed mod cueing for correct day/date.  Pt crossed out days in September that have past to assist with this and instructed pt/grandmother that how they should do this at home and  that pt may benefit from verbal and visual reminders.  Provided folder with calendars for Sept/Oct.  Also discussed using daily written schedule and pt's phone to help pt follow schedule.  Provided grandmother with multiple copies of blank daily schedule with times on it for them to fill out at home to assist with consistency and incr ability for pt to orient/follow schedule throughout the day.  Instructed grandmother to include times to work on therapy homework.  Pt to take folder with him wherever he goes including therapy.  Pt/grandmother verbalized understanding.  Grandmother reports pt continues to need min cueing for picking out clothes.  Sorting change with min-mod cues to continue task/attention and for accuracy (with environmental distractions).  Counting paper bills when given cash and writing amount, then counting out desired amount.  For incr attention (quiet environment) and for counting/following directions.  Pt needed min-mod cues to start with largest bill.  Discussed visual perceptual difficulties noted during session.  Pt/grandmother verbalized understanding.                   OT Education - 09/19/17 1440    Education Details Use of written calendar (marking off days as they pass) and written schedule to assist with orientation.  Issued blank calendars for Sept./Oct. and blank daily schedule for home and instructed pt to bring to therapy and keep it with him where he goes (grandmother's/mother's house)   Person(s) Educated Patient;Caregiver(s)   Methods Explanation;Handout   Comprehension Verbalized understanding          OT Short Term Goals - 09/19/17 1450      OT SHORT TERM GOAL #1   Title Pt/caregiver will be independent with initial HEP/functional tasks for home.--check STGs    Baseline dependent   Time 4   Period Weeks   Status On-going     OT SHORT TERM GOAL #2   Title Pt will perform UB dressing consistently without cueing.   Time 4   Period  Weeks   Status Achieved  removing shirt min A due to long hair     OT SHORT TERM GOAL #3   Title Pt will be able to choose/retrieve appropriate clothing without verbal prompts.   Baseline not performing/set up currently   Time 4   Period Weeks   Status On-going  able to choose between 2 options     OT SHORT TERM GOAL #4   Title Pt will be able to fold laundry with set-up.   Baseline dependent/not performing   Time 4   Period Weeks   Status On-going  min verbal cues     OT SHORT TERM GOAL #5   Title Pt/caregiver will verbalize understanding of cognitive compensation strategies for ADLs/functional tasks to incr participation in ADLs.   Time 4   Period Weeks   Status On-going           OT Long Term Goals - 07/31/17 2113      OT LONG TERM GOAL #1   Title Pt/caregiver will be independent with updated HEP/functional tasks for home.-- check LTGs 09/29/17   Baseline dependent   Time  8   Period Weeks   Status New     OT LONG TERM GOAL #2   Title Pt will perform bathing/dressing without cueing (distant supervision).   Baseline min A, and prompts   Time 8   Period Weeks   Status New     OT LONG TERM GOAL #3   Title Pt will follow simple schedule/routine for ADLs with min cues.   Baseline pt needs cueing, assistance for initiation and throughout ADLs, decr attention/ability to follow directions   Time 8   Period Weeks   Status New     OT LONG TERM GOAL #4   Title Pt will perform simple snack prep with min cueing/supervision.   Baseline not performing/dependent   Time 8   Period Weeks   Status New     OT LONG TERM GOAL #5   Title Pt will perform simple home maintenance task with supervision/min cueing.   Baseline not performing/dependent   Time 8   Period Weeks   Status New               Plan - 09/19/17 1446    Clinical Impression Statement Pt demo improving attention and participation during session.  Pt also demo incr awareness of deficits today.     Rehab Potential Fair   Current Impairments/barriers affecting progress: cognitive deficits, urinary catheter, severity of deficits   OT Frequency 2x / week  coming 1x week currently due to [facility] visit limits   OT Duration --  10 weeks +evaluation (or 21 visits).  However, pt's family reports that it is anticipated that pt will receive Medicaid.  If/when pt is approved for Medicaid, frequency will be adjusted to 2x  Week for 6 weeks due to Medicaid visit limitations.    OT Treatment/Interventions Self-care/ADL training;Therapeutic exercise;DME and/or AE instruction;Building services engineer;Therapeutic activities;Patient/family education;Cognitive remediation/compensation;Neuromuscular education;Visual/perceptual remediation/compensation;Therapeutic exercises   Plan review orientation with calendar and daily schedules; continue with functional cognition   Consulted and Agree with Plan of Care Patient;Family member/caregiver   Family Member Consulted grandmother--Sue, Mom      Patient will benefit from skilled therapeutic intervention in order to improve the following deficits and impairments:  Decreased balance, Impaired vision/preception, Decreased cognition, Decreased knowledge of precautions, Impaired perceived functional ability, Impaired UE functional use, Decreased strength, Decreased safety awareness, Decreased knowledge of use of DME, Decreased coordination, Decreased activity tolerance  Visit Diagnosis: Attention and concentration deficit  Visuospatial deficit  Other lack of coordination  Frontal lobe and executive function deficit    Problem List Patient Active Problem List   Diagnosis Date Noted  . Seizure disorder (HCC) 09/12/2017  . Urinary retention 07/12/2017  . Acute renal insufficiency 07/12/2017  . Acute cystitis without hematuria   . Hypotension   . 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 Acute urinary retention 06/26/2017  .  Staphylococcus epidermidis ventriculitis 06/26/2017  . Bacteremia due to Streptococcus pneumoniae 06/26/2017  . Bordetella pertussis 06/26/2017  . 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 Transaminitis   . Endotracheal tube present   . Status epilepticus (HCC)   . History of syphilis   . Tobacco abuse   . Meningoencephalitis   . 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 Acute respiratory failure (HCC)   . Seizure (HCC) 05/20/2017  . Peri-rectal abscess 03/06/2017  . 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 Anemia 09/12/2016  . 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 Bipolar disorder (HCC) 04/08/2013  . Diaper rash 10/10/2012    [facility] 09/19/2017, 2:50 PM  [facility] [address] [place], [name], [address] Phone: [phone]   Fax:  [phone]  Name: Justin Ball MRN: [id number] Date of Birth: 12/28/1990   Willa Frater, OTR/L [facility] [address] [place], [name]  [address] [phone] phone [phone] 09/19/17 2:51 PM

## 2017-09-24 ENCOUNTER — Telehealth: Payer: Self-pay | Admitting: *Deleted

## 2017-09-24 NOTE — Telephone Encounter (Signed)
Patient's mother called requesting a referral to an eye doctor. His close up vision is fine. He is having an issue with distance. Please advise if ok for referral.

## 2017-09-24 NOTE — Telephone Encounter (Signed)
I think he should see an OPTHALMOLOGIST and he also needs to take his ARV reliably--which he is NOT since his VL is up. Does he have an appt coming up for recheck of VL on current therapy

## 2017-09-25 ENCOUNTER — Other Ambulatory Visit: Payer: Self-pay | Admitting: Infectious Disease

## 2017-09-25 ENCOUNTER — Telehealth: Payer: Self-pay | Admitting: *Deleted

## 2017-09-25 DIAGNOSIS — H539 Unspecified visual disturbance: Secondary | ICD-10-CM

## 2017-09-25 NOTE — Telephone Encounter (Signed)
RN contacted [name]'s mom and was unable to reach her by phone. I have made several attempts with a message left a well. I do know that [name]'s mom works from 7:30 to 4:30 so I will attempt another call after 4:30

## 2017-09-25 NOTE — Telephone Encounter (Signed)
Thanks Jackie 

## 2017-09-25 NOTE — Telephone Encounter (Signed)
[name] can you make sure Justin Ball is going to see Arush as well. Something is WRONG

## 2017-09-25 NOTE — Telephone Encounter (Signed)
Spoke to Corning and she will reach back out to the Mom today. Mom previously told her there was no problem with him taking his medication. But since there seems to be a problem Molli Posey will try to make a home visit; even if it needs to be after 5:00 pm.

## 2017-09-25 NOTE — Telephone Encounter (Signed)
Referral placed and he has an upcoming appointment with you on 10/15/17. Justin Ball

## 2017-09-26 ENCOUNTER — Telehealth: Payer: Self-pay | Admitting: *Deleted

## 2017-09-26 ENCOUNTER — Ambulatory Visit: Payer: Medicaid Other

## 2017-09-26 ENCOUNTER — Ambulatory Visit: Payer: Medicaid Other | Attending: Internal Medicine | Admitting: Occupational Therapy

## 2017-09-26 ENCOUNTER — Ambulatory Visit: Payer: Self-pay | Admitting: *Deleted

## 2017-09-26 DIAGNOSIS — M6281 Muscle weakness (generalized): Secondary | ICD-10-CM | POA: Insufficient documentation

## 2017-09-26 DIAGNOSIS — R41840 Attention and concentration deficit: Secondary | ICD-10-CM | POA: Diagnosis not present

## 2017-09-26 DIAGNOSIS — R278 Other lack of coordination: Secondary | ICD-10-CM

## 2017-09-26 DIAGNOSIS — B20 Human immunodeficiency virus [HIV] disease: Secondary | ICD-10-CM

## 2017-09-26 DIAGNOSIS — R41841 Cognitive communication deficit: Secondary | ICD-10-CM

## 2017-09-26 DIAGNOSIS — R41842 Visuospatial deficit: Secondary | ICD-10-CM

## 2017-09-26 DIAGNOSIS — R2681 Unsteadiness on feet: Secondary | ICD-10-CM | POA: Diagnosis present

## 2017-09-26 DIAGNOSIS — R41844 Frontal lobe and executive function deficit: Secondary | ICD-10-CM

## 2017-09-26 NOTE — Telephone Encounter (Signed)
Because he is not showing new R we KNOW this is due to poor adherence

## 2017-09-26 NOTE — Telephone Encounter (Signed)
Contacted Ms Franco Collet again after 4:30 since I know she typically works from 7;30 to 4:30. I did not receive an answer. I have already left a message so I did not leave another message at this time. I will try to reach Mr. Varnell grandmother tomorrow to attempt to arrange a home visit

## 2017-09-26 NOTE — Progress Notes (Signed)
Received a call from Mr Justin Ball stated she has somewhere to be at 5:15. RN apologized for running behind. I agreed to pick up Alta's ointment which was a distance away.  Mr Justin Ball verbalized understanding. I asked her even if we cannot meet today can we go over his medications.  Mr Justin Ball stated in the home they have Keppra (BID), Tivicay(daily) Bactrim(3 times a week), Odefsey (daily) and Fluconazole. Seroquel is in the home too but she states they normally do not give it to him  RN stated to Justin Ball that I wanted to be sure Alanson has all his needed medications because his viral load is increasing instead of decreasing. Justin Ball asked what that meant and I explained to her what a viral load means and that we want it to be as low as possible. Explained to her that in August he was 38 but by 09/20 he was back up to 7,000. Justin Ball stated she does not understand why. I asked if she is sure that Askia is actually taking the pills and not throwing them away? She stated normally he takes the medications right in front of either her or her mom. She did stay they just picked up the fluconazole about a month ago. RN informed [name] of the 2 medications that decrease his viral load(Tivicay and Odefsey) and that he must be consistent with taking these two to decrease the chances of him getting sick again. We agreed to try and meet again on Monday after 5pm.

## 2017-09-26 NOTE — Telephone Encounter (Signed)
Received a call from Mr Justin Ball stated she has somewhere to be at 5:15. RN apologized for running behind. I agreed to pick up Justin Ball's ointment which was a distance away.  Mr Justin Ball verbalized understanding. I asked her even if we cannot meet today can we go over his medications.  Mr Justin Ball stated in the home they have Keppra (BID), Tivicay(daily) Bactrim(3 times a week), Odefsey (daily) and Fluconazole. Seroquel is in the home too but she states they normally do not give it to him  RN stated to Ms. Justin Ball that I wanted to be sure [name] has all his needed medications because his viral load is increasing instead of decreasing. Ms. Justin Ball asked what that meant and I explained to her what a viral load means and that we want it to be as low as possible. Explained to her that in August he was 13 but by 09/20 he was back up to 7,000. Ms. Justin Ball stated she does not understand why. I asked if she is sure that [name] is actually taking the pills and not throwing them away? She stated normally he takes the medications right in front of either her or her mom. She did stay they just picked up the fluconazole about a month ago. RN informed [name] of the 2 medications that decrease his viral load(Tivicay and Odefsey) and that he must be consistent with taking these two to decrease the chances of him getting sick again. Ms. Justin Ball verbalized understanding and we agreed to try and meet on Monday.

## 2017-09-26 NOTE — Telephone Encounter (Signed)
That is my thoughts too especially since I just learned that the ointment for his bottom was never picked up. I am picking that up prior to our visit today

## 2017-09-26 NOTE — Therapy (Signed)
[facility] [address] [place], [place], [address] Phone: [phone]   Fax:  [phone]  Occupational Therapy Treatment  Patient Details  Name: Justin Ball MRN: [id number] Date of Birth: 07/07/1991 Referring Provider: Dr. Rhina Brack[place] Dam  Encounter Date: 09/26/2017      OT End of Session - 09/26/17 1735    Visit Number 9   Number of Visits 21   Date for OT Re-Evaluation 10/12/17   Authorization Type Aetna--25 visits combined hard limit; Grandmother reports Medicaid approved 09/18/17--awaiting authorization   Authorization Time Period 25 visits combined (PT used 3), 3 visits approved by MCD through 10/27/17   Authorization - Visit Number 9   Authorization - Number of Visits 11   OT Start Time 1320   OT Stop Time 1400   OT Time Calculation (min) 40 min      Past Medical History:  Diagnosis Date  . Acute renal insufficiency 07/12/2017  . Anxiety   . Bipolar 1 disorder ([place])   . Depression   . HIV infection ([place])   . Neuromuscular disorder ([place])   . Scabies   . 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 Substance abuse   . Syphilis     Past Surgical History:  Procedure Laterality Date  . EXAMINATION UNDER ANESTHESIA N/A 12/28/2014   Procedure: EXAM UNDER ANESTHESIA;  Surgeon: Michael Boston, MD;  Location: [facility];  Service: General;  Laterality: N/A;  . FLOOR OF MOUTH BIOPSY N/A 09/16/2016   Procedure: BIOPSY OF ORAL ABCESS;  Surgeon: Leta Baptist, MD;  Location: [facility];  Service: ENT;  Laterality: N/A;  . INCISION AND DRAINAGE ABSCESS N/A 09/16/2016   Procedure: INCISION AND DRAINAGE ABSCESS;  Surgeon: Leta Baptist, MD;  Location: [facility];  Service: ENT;  Laterality: N/A;  . INCISION AND DRAINAGE PERIRECTAL ABSCESS  08/27/2010   Dr Marlou Starks  . INCISION AND DRAINAGE PERIRECTAL ABSCESS N/A 12/28/2014   Procedure: IRRIGATION AND DEBRIDEMENT PERIRECTAL ABSCESS;  Surgeon: Michael Boston, MD;  [facility];  Service: General;   Laterality: N/A;  Fistula repair and ablation  . LAPAROSCOPIC REVISION VENTRICULAR-PERITONEAL (V-P) SHUNT N/A 06/12/2017   Procedure: LAPAROSCOPIC REVISION VENTRICULAR-PERITONEAL (V-P) SHUNT;  Surgeon: Kinsinger, Arta Bruce, MD;  Location: [facility];  Service: General;  Laterality: N/A;  . VENTRICULOPERITONEAL SHUNT N/A 06/12/2017   Procedure: SHUNT INSERTION VENTRICULAR-PERITONEAL;  Surgeon: Consuella Lose, MD;  Location: [facility];  Service: Neurosurgery;  Laterality: N/A;    There were no vitals filed for this visit.      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 No/denies          Treatment:Pt forgot to bring in his schedule from home.  Pt performed a scheduling task with mod/ max difficulty, v.c to write items into a weekly schedule. Pt required larger print due to visual deficits.  [name] grandmother reports pt made a sandwich today before therapy. Pt. played dominoes with therapist for increased attention in min distracting environment. Pt required mod v.c to remember  steps involved in the game and to visually scan his dominoes, mod/ max v.c. Discussion with pt's grandmother regarding recommendations that pt can benefit from a PCA and that a Day program would be great for [name]. [name] grandmother is meeting with a nurse from Dr. Derek Mound office to  discuss these options and pt's meds. Therapist also discussed pt's Medicaid approval of  3 visits in the first month. Pt will have aetna primary and Medicaid secondary.                     OT Short Term Goals - 09/26/17 1750      OT SHORT TERM GOAL #1   Title Pt/caregiver will be independent with initial HEP/functional tasks for home.--check STGs    Baseline dependent   Time 4   Period Weeks   Status Achieved     OT SHORT TERM GOAL #2   Title Pt will perform UB dressing consistently without cueing.   Time 4   Period Weeks   Status Achieved  removing shirt min A due to long hair     OT SHORT TERM GOAL #3   Title Pt will be able to choose/retrieve appropriate clothing without verbal prompts.   Baseline not performing/set up currently   Time 4   Period Weeks   Status Partially Met  able to choose between 2 options     OT SHORT TERM GOAL #4   Title Pt will be able to fold laundry with set-up.   Baseline dependent/not performing   Time 4   Period Weeks   Status On-going  min verbal cues     OT SHORT TERM GOAL #5   Title Pt/caregiver will verbalize understanding of cognitive compensation strategies for ADLs/functional tasks to incr participation in ADLs.   Time 4   Period Weeks   Status On-going  Pt is starting to use a schedule           OT Long Term Goals - 07/31/17 2113      OT LONG TERM GOAL #1   Title Pt/caregiver will be independent with updated HEP/functional tasks for home.-- check LTGs 09/29/17   Baseline dependent   Time 8   Period Weeks   Status New     OT LONG TERM GOAL #2   Title Pt will perform bathing/dressing without cueing (distant supervision).   Baseline min A, and prompts   Time 8   Period Weeks   Status New     OT LONG TERM GOAL #3   Title Pt will follow simple schedule/routine for ADLs with min cues.   Baseline pt needs cueing, assistance for initiation and throughout ADLs, decr attention/ability to follow directions   Time 8   Period Weeks   Status New     OT LONG TERM GOAL #4   Title Pt will perform  simple snack prep with min cueing/supervision.   Baseline not performing/dependent   Time 8   Period Weeks   Status New     OT LONG TERM GOAL #5   Title Pt will perform simple home maintenance task with supervision/min cueing.   Baseline not performing/dependent   Time 8   Period Weeks   Status New               Plan - 09/26/17 1736    Clinical Impression Statement Pt is progressing slowly towards goals. Pt remains limited by significant cognitive and visual deficits.   Rehab Potential Fair  Current Impairments/barriers affecting progress: cognitive deficits, urinary catheter, severity of deficits   OT Frequency 1x / week   OT Duration --  21 visits total   OT Treatment/Interventions Self-care/ADL training;Therapeutic exercise;DME and/or AE instruction;Therapist, nutritional;Therapeutic activities;Patient/family education;Cognitive remediation/compensation;Neuromuscular education;Visual/perceptual remediation/compensation;Therapeutic exercises   Plan review pt's daily schedule if he brings it in next visit, continue cognitive retraining, schedule 1 more OT visit   Consulted and Agree with Plan of Care Patient;Family member/caregiver   Family Member Consulted grandmother--Sue, Mom      Patient will benefit from skilled therapeutic intervention in order to improve the following deficits and impairments:  Decreased balance, Impaired vision/preception, Decreased cognition, Decreased knowledge of precautions, Impaired perceived functional ability, Impaired UE functional use, Decreased strength, Decreased safety awareness, Decreased knowledge of use of DME, Decreased coordination, Decreased activity tolerance  Visit Diagnosis: Attention and concentration deficit  Visuospatial deficit  Other lack of coordination  Frontal lobe and executive function deficit    Problem List Patient Active Problem List   Diagnosis Date Noted  . Seizure disorder ([place]) 09/12/2017  .  Urinary retention 07/12/2017  . Acute renal insufficiency 07/12/2017  . Acute cystitis without hematuria   . Hypotension   . 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 Acute urinary retention 06/26/2017  . Staphylococcus epidermidis ventriculitis 06/26/2017  . Bacteremia due to Streptococcus pneumoniae 06/26/2017  . Bordetella pertussis 06/26/2017  . 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 Transaminitis   . Endotracheal tube present   . Status epilepticus ([place] [place])   . History of syphilis   . Tobacco abuse   . Meningoencephalitis   . 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 Acute respiratory failure ([place])   . Seizure ([place] [facility]) 05/20/2017  . Peri-rectal abscess 03/06/2017  . 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 Anemia 09/12/2016  . 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 Bipolar disorder ([place]) 04/08/2013  . Diaper rash 10/10/2012    RINE,KATHRYN 09/26/2017, 5:54 PM Theone Murdoch, OTR/L [phone] Phone: [phone] 11:37 AM 09/27/17 [facility] [address] [facility] [place], [place], [address] Phone: [phone]   Fax:  [phone]  Name: LESLY JOSLYN MRN: [id number] Date of Birth: 1991-06-26

## 2017-09-26 NOTE — Telephone Encounter (Signed)
Contacted the pharmacy to see if the Periguard ointment is ready for pick up. Pharmacy staff stated the medications is not ready but can be ready before my arrive. Medications is not covered by insurance and will be 14.98. I will see if the mom would like to pay the cost of the medication prior to requesting a fill.

## 2017-09-26 NOTE — Telephone Encounter (Signed)
Thanks Ambre, I also worry there may be moregoing on with his bottom since I really wasn't allowed to examine this very well--he was in a lot of pain

## 2017-09-26 NOTE — Telephone Encounter (Signed)
Received a cal from Justin Ball's mother9[name]) she apologized for not returning my call. She stated she lost her phone and all of her contact information. Verbalized understanding and she stated a visit today at her home would be fine. Victorino Dike did state she never picked up the cream for Justin Ball's bottom because she was not sure which [facility] to [facility]. I let her know that I can stop by the pharmacy before coming to her home. Visit will be after 4:30 today

## 2017-09-26 NOTE — Telephone Encounter (Signed)
Since I have been unable to connect with Justin Ball's mom I contacted Justin Ball's grandmother Justin Ball). Spoke with Ms. Justin Ball who stated Justin Ball is usually with her during the day and with his mom at night. I explained to [name] that I work with Justin Ball's Justin and would love to make a home visit to offer services. [name] says that Justin Ball has therapy today but would love some help with having someone come into the home to assist with Justin Ball's ADL's and IADL's. Ms. Justin Ball also explained that Justin Ball is like a son to her and has lived with her since he was 26 yrs old.   Explained that I may be able to help with that by requesting a PCS assessment under Justin Ball's medicaid benefits. We also discussed day centers that Justin Ball may be able to go to during the day as well. I asked Ms. Justin Ball if I can also take a look at Justin Ball's medications to be sure he has all that he needs. [name] also stated they have a pill box system and Justin Ball's mother fills the pillbox without any problems (Justin Ball's mother told me the same thing).  [name] spoke openly about her frustration with Justin Ball not being honest about taking his medications. [name] stated someone from our office should have told her but she also stated she understands the privacy act. I told Ms. Justin Ball that I would like to be honest with her now and make her aware that on 08/09 Justin Ball's viral load was looking really good at 66 but since then he has increased to almost 7,000 (09/13/17) which suggest the medication is no longer effective or not being taking consistently.  Informed Ms. Justin Ball that Justin Ball has ordered labs to be sure the medication still works for Justin Ball. [name] was shocked and concerned that his viral load was up and stated we need to get to the bottom of this. She agreed to meet me this afternoon either at her Justin Ball's home or her home to discuss Justin Ball's medications and other needs.

## 2017-09-27 LAB — HIV-1 INTEGRASE GENOTYPE

## 2017-09-27 LAB — HEPATITIS B DNA, ULTRAQUANTITATIVE, PCR
HEPATITIS B DNA: 5840 [IU]/mL — AB
Hepatitis B DNA (Calc): 3.77 Log IU/mL — ABNORMAL HIGH

## 2017-09-27 LAB — HIV RNA, RTPCR W/R GT (RTI, PI,INT)
HIV 1 RNA QUANT: 6970 {copies}/mL — AB
HIV-1 RNA Quant, Log: 3.84 Log copies/mL — ABNORMAL HIGH

## 2017-09-27 LAB — HIV-1 GENOTYPE: HIV-1 GENOTYPE: DETECTED — AB

## 2017-09-27 NOTE — Therapy (Signed)
[facility] [address] [place], [name], [address] Phone: [phone]   Fax:  [phone]  Speech Language Pathology Treatment  Patient Details  Name: Justin Ball MRN: [id number] Date of Birth: [birth year] Referring Provider: [name], Remi Haggard, MD  Encounter Date: 09/26/2017      End of Session - 09/27/17 1647    Visit Number 7   Number of Visits 20   Date for SLP Re-Evaluation 10/19/17   Authorization Type AETNA - 25 visits combined - HARD LIMIT   Authorization - Visit Number 7   Authorization - Number of Visits 8   SLP Start Time 1234   SLP Stop Time  1315   SLP Time Calculation (min) 41 min   Activity Tolerance Patient tolerated treatment well      Past Medical History:  Diagnosis Date  . Acute renal insufficiency 07/12/2017  . Anxiety   . Bipolar 1 disorder ([facility])   . Depression   . HIV infection ([facility])   . Neuromuscular disorder ([facility])   . Scabies   . 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 Substance abuse   . Syphilis     Past Surgical History:  Procedure Laterality Date  . EXAMINATION UNDER ANESTHESIA N/A 12/28/2014   Procedure: EXAM UNDER ANESTHESIA;  Surgeon: Karie Soda, MD;  [facility];  Service: General;  Laterality: N/A;  . FLOOR OF MOUTH BIOPSY N/A 09/16/2016   Procedure: BIOPSY OF ORAL ABCESS;  Surgeon: Newman Pies, MD;  [facility];  Service: ENT;  Laterality: N/A;  . INCISION AND DRAINAGE ABSCESS N/A 09/16/2016   Procedure: INCISION AND DRAINAGE ABSCESS;  Surgeon: Newman Pies, MD;  [facility];  Service: ENT;  Laterality: N/A;  . INCISION AND DRAINAGE PERIRECTAL ABSCESS  08/27/2010   Dr Carolynne Edouard  . INCISION AND DRAINAGE PERIRECTAL ABSCESS N/A 12/28/2014   Procedure: IRRIGATION AND DEBRIDEMENT PERIRECTAL ABSCESS;  Surgeon: Karie Soda, MD;  [facility];  Service: General;  Laterality: N/A;  Fistula repair and ablation  . LAPAROSCOPIC REVISION VENTRICULAR-PERITONEAL (V-P) SHUNT N/A  06/12/2017   Procedure: LAPAROSCOPIC REVISION VENTRICULAR-PERITONEAL (V-P) SHUNT;  Surgeon: Kinsinger, De Blanch, MD;  [facility];  Service: General;  Laterality: N/A;  . VENTRICULOPERITONEAL SHUNT N/A 06/12/2017   Procedure: SHUNT INSERTION VENTRICULAR-PERITONEAL;  Surgeon: [name], MD;  [facility];  Service: Neurosurgery;  Laterality: N/A;    There were no vitals filed for this visit.      Subjective Assessment - 09/26/17 1242    Subjective "Do you have any glasses? I need some glasses. I'm blind."   Patient is accompained by: Family member  Grandmother   Currently in Pain? No/denies               ADULT SLP TREATMENT - 09/26/17 1244      General Information   Behavior/Cognition Alert;Cooperative;Distractible;Requires cueing     Treatment Provided   Treatment provided Cognitive-Linquistic     Cognitive-Linquistic Treatment   Treatment focused on Cognition   Skilled Treatment Language of confusion initially, pt thinking SLP could dispense glasses. Grandmother states they have made lists for sequencing steps in the morning.  Grandmother asked SLP if pt would benefit from having someone come in to assist pt in AMs; SLP thought pt would benefit from this. Sustained attention targeted today with 4 step sequences - pt able to sustain attention for simple 4-step sequence however in 4 step sequence of less familar situation (hair appt) pt had more difficulty, requiring  SLP mod cues occasionally. Ordering cards in five-step sequence req'd written cues and SLP mod A consistently. Pt demonstrated reduced sustained attention after approx 30-40 seconds during this task.      Assessment / Recommendations / Plan   Plan Continue with current plan of care     Progression Toward Goals   Progression toward goals --  pt sustained attention level slows progress at this time            SLP Short Term Goals - 09/27/17 1648      SLP SHORT TERM GOAL #1   Title pt will  demo sustained attention for 5 minutes, in 3 different tasks completed 90% success, in 3 sessions   Time 1   Period Weeks   Status On-going     SLP SHORT TERM GOAL #2   Title pt will tell SLP 2 cognitive defict areas with min A occasionallly, in three sessions   Time 1   Period Weeks   Status On-going     SLP SHORT TERM GOAL #3   Title pt will demo orientation to year, month, date with modified independence (calendar)   Time 1   Period Weeks   Status On-going     SLP SHORT TERM GOAL #4   Title pt will demo sustained attention to one task for 10 minutes to achieve 90% success   Time 1   Period Weeks   Status On-going          SLP Long Term Goals - 09/27/17 1648      SLP LONG TERM GOAL #1   Title pt will demonstrate sustained attention to one task for 15 mintues in order to achieve 90% success with rare nonverbal cues back to task, in two sessions   Time 6   Period Weeks  or 20 visits   Status On-going     SLP LONG TERM GOAL #2   Title pt will demo emergent awareness in a simple paper/pencil cognitive linguistic task 60% of the time given pt double checking responses   Time 6   Period Weeks  or 20 visits   Status On-going     SLP LONG TERM GOAL #3   Title pt will demo orientation to month, day, date with modified independence over 8 sessions   Time 6   Period Weeks  or 20 visits   Status On-going          Plan - 09/27/17 1647    Clinical Impression Statement Pt continues to exhibit moderate to severe deficits in orientation, attention, recall, and verbal expression. Please see "skilled intervention" for further details. Continued ST intervention is recommended to maximize cognitive linguistic skills for improved safety and independence, as well as decreased burden of care.   Speech Therapy Frequency 1x /week   Duration --  10 weeks or 20 visits   Treatment/Interventions Language facilitation;Compensatory techniques;Internal/external aids;SLP instruction and  feedback;Cognitive reorganization;Environmental controls;Multimodal communcation approach;Functional tasks;Cueing hierarchy;Patient/family education   Potential to Achieve Goals Fair   Potential Considerations Severity of impairments;Cooperation/participation level;Family/community support;Ability to learn/carryover information   SLP Home Exercise Plan home tasks provided today   Consulted and Agree with Plan of Care Patient;Family member/caregiver   Family Member Consulted grandmother Justin Ball      Patient will benefit from skilled therapeutic intervention in order to improve the following deficits and impairments:   Cognitive communication deficit    Problem List Patient Active Problem List   Diagnosis Date Noted  . Seizure disorder ([facility])  09/12/2017  . Urinary retention 07/12/2017  . Acute renal insufficiency 07/12/2017  . Acute cystitis without hematuria   . Hypotension   . 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 Acute urinary retention 06/26/2017  . Staphylococcus epidermidis ventriculitis 06/26/2017  . Bacteremia due to Streptococcus pneumoniae 06/26/2017  . Bordetella pertussis 06/26/2017  . 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 Transaminitis   . Endotracheal tube present   . Status epilepticus ([facility])   . History of syphilis   . Tobacco abuse   . Meningoencephalitis   . 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 Acute respiratory failure ([facility])   . Seizure ([facility]) 05/20/2017  . Peri-rectal abscess 03/06/2017  . 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 Anemia 09/12/2016  . 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 Bipolar disorder ([facility]) 04/08/2013  . Diaper rash 10/10/2012    [facility] ,MS, CCC-SLP  09/27/2017, 4:49 PM  [facility] [address] [place], [name], [address] Phone: [phone]   Fax:   [phone]   Name: Justin Ball MRN: [id number] Date of Birth: 09-Jan-1991

## 2017-10-01 ENCOUNTER — Ambulatory Visit: Payer: Self-pay | Admitting: *Deleted

## 2017-10-01 ENCOUNTER — Telehealth: Payer: Self-pay | Admitting: *Deleted

## 2017-10-01 DIAGNOSIS — R4189 Other symptoms and signs involving cognitive functions and awareness: Secondary | ICD-10-CM

## 2017-10-01 DIAGNOSIS — B20 Human immunodeficiency virus [HIV] disease: Secondary | ICD-10-CM

## 2017-10-03 ENCOUNTER — Ambulatory Visit: Payer: Medicaid Other | Admitting: Occupational Therapy

## 2017-10-03 ENCOUNTER — Ambulatory Visit: Payer: Medicaid Other | Admitting: Speech Pathology

## 2017-10-03 DIAGNOSIS — R278 Other lack of coordination: Secondary | ICD-10-CM

## 2017-10-03 DIAGNOSIS — R41842 Visuospatial deficit: Secondary | ICD-10-CM

## 2017-10-03 DIAGNOSIS — R41840 Attention and concentration deficit: Secondary | ICD-10-CM | POA: Diagnosis not present

## 2017-10-03 DIAGNOSIS — R41844 Frontal lobe and executive function deficit: Secondary | ICD-10-CM

## 2017-10-03 DIAGNOSIS — R41841 Cognitive communication deficit: Secondary | ICD-10-CM

## 2017-10-03 NOTE — Therapy (Signed)
[facility] [address] [place], [place], [address] Phone: [phone]   Fax:  [phone]  Occupational Therapy Treatment  Patient Details  Name: Justin Ball MRN: [id number] Date of Birth: 11-Mar-1991 Referring Provider: Dr. Rhina Brackett Dam  Encounter Date: 10/03/2017      OT End of Session - 10/03/17 1700    Visit Number 10   Number of Visits 21   Date for OT Re-Evaluation 10/12/17   Authorization Type Aetna--25 visits combined hard limit; Grandmother reports Medicaid approved 09/18/17--awaiting authorization   Authorization Time Period Aetna--25 visits combined (PT used 3).  3 visits approved by MCD through 10/27/17-- Today is 2/3 approved   Authorization - Visit Number 10   Authorization - Number of Visits 11   OT Start Time 1148   OT Stop Time 1232   OT Time Calculation (min) 44 min   Activity Tolerance Patient tolerated treatment well   Behavior During Therapy WFL for tasks assessed/performed      Past Medical History:  Diagnosis Date  . Acute renal insufficiency 07/12/2017  . Anxiety   . Bipolar 1 disorder ([place])   . Depression   . HIV infection ([place])   . Neuromuscular disorder ([place])   . Scabies   . 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 Substance abuse   . Syphilis     Past Surgical History:  Procedure Laterality Date  . EXAMINATION UNDER ANESTHESIA N/A 12/28/2014   Procedure: EXAM UNDER ANESTHESIA;  Surgeon: Michael Boston, MD;  [facility]: [facility];  Service: General;  Laterality: N/A;  . FLOOR OF MOUTH BIOPSY N/A 09/16/2016   Procedure: BIOPSY OF ORAL ABCESS;  Surgeon: Leta Baptist, MD;  Location: [facility];  Service: ENT;  Laterality: N/A;  . INCISION AND DRAINAGE ABSCESS N/A 09/16/2016   Procedure: INCISION AND DRAINAGE ABSCESS;  Surgeon: Leta Baptist, MD;  Location: [facility];  Service: ENT;  Laterality: N/A;  . INCISION AND DRAINAGE PERIRECTAL ABSCESS  08/27/2010   Dr Marlou Starks  . INCISION AND DRAINAGE PERIRECTAL ABSCESS  N/A 12/28/2014   Procedure: IRRIGATION AND DEBRIDEMENT PERIRECTAL ABSCESS;  Surgeon: Michael Boston, MD;  [facility];  Service: General;  Laterality: N/A;  Fistula repair and ablation  . LAPAROSCOPIC REVISION VENTRICULAR-PERITONEAL (V-P) SHUNT N/A 06/12/2017   Procedure: LAPAROSCOPIC REVISION VENTRICULAR-PERITONEAL (V-P) SHUNT;  Surgeon: Kinsinger, Arta Bruce, MD;  Location: [facility];  Service: General;  Laterality: N/A;  . VENTRICULOPERITONEAL SHUNT N/A 06/12/2017   Procedure: SHUNT INSERTION VENTRICULAR-PERITONEAL;  Surgeon: Consuella Lose, MD;  Location: [facility];  Service: Neurosurgery;  Laterality: N/A;    There were no vitals filed for this visit.      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 No/denies      Reviewed that 1 more OT visit is approved currently with Aetna/Medicaid.    Placing Perfection pieces in board for incr attention  and visual scanning/perception.  Pt with mod difficulty/cueing (to look at one line at a time/limit visual space).  Pt did appear to do better with glasses.  Discussed/educated pt/caregiver on appropriate chores/household HEP/activities and how to break tasks down and start with 1 simple task at a time adding it to daily schedule.  (pt did not bring in daily schedule today).     Reinforced importance of using daily schedule and bringing to therapy/whereever he goes.  4-step sequencing picture cards with mod cueing to describe and sequence.                        OT Education - 10/03/17 1712    Education Details Appropriate functional tasks/HEP for home, how to break down and simplify tasks and add one at a time--see pt instructions   Person(s) Educated Patient;Caregiver(s)   Methods Verbal cues;Explanation;Handout   Comprehension Verbalized understanding          OT Short Term Goals - 10/03/17 1654      OT SHORT TERM GOAL #1   Title Pt/caregiver will be independent with initial HEP/functional tasks for home.--check STGs    Baseline dependent   Time 4   Period Weeks   Status Achieved     OT SHORT TERM GOAL #2   Title Pt will perform UB dressing consistently without cueing.   Time 4   Period Weeks   Status Achieved  removing shirt min A due to long hair     OT SHORT TERM GOAL #3   Title Pt will be able to choose/retrieve appropriate clothing without verbal prompts.   Baseline not performing/set up currently   Time 4   Period Weeks   Status Partially Met  able to choose between 2 options     OT SHORT TERM GOAL #4   Title Pt will be able to fold laundry with set-up.   Baseline dependent/not performing   Time 4   Period Weeks   Status On-going  min verbal cues     OT SHORT TERM GOAL #5   Title Pt/caregiver will verbalize understanding of cognitive compensation strategies for ADLs/functional tasks to incr participation in ADLs.   Time 4   Period Weeks   Status On-going  Pt is starting to use a schedule, 10/03/17:  ongoing education, pt/grandma verbalized understanding of strategies           OT Long Term Goals - 10/03/17 1656      OT LONG TERM GOAL #1   Title Pt/caregiver will be independent with updated HEP/functional tasks for home.-- check LTGs 09/29/17   Baseline dependent   Time 8   Period Weeks   Status  On-going     OT LONG TERM GOAL #2   Title Pt will perform bathing/dressing without cueing (distant supervision).   Baseline min A, and prompts   Time 8   Period Weeks   Status On-going     OT LONG TERM GOAL #3   Title Pt will follow simple schedule/routine for ADLs with mod cues.   Baseline pt needs cueing, assistance for initiation and throughout ADLs, decr attention/ability to follow directions   Time 8   Period Weeks   Status Revised  10/03/17:  revised goal     OT LONG TERM GOAL #4   Title Pt will perform simple snack prep with min cueing/supervision.   Baseline not performing/dependent   Time 8   Period Weeks   Status Achieved  10/03/17:  in familiar environment per grandmother report     OT LONG TERM GOAL #5   Title Pt will perform simple home maintenance task with supervision/min cueing.   Baseline not performing/dependent   Time 8   Period Weeks   Status On-going               Plan - 10/03/17 1701    Clinical Impression Statement Grandmother reports pt is making sandwich and warmed up soup with supervision.  Pt is slowly progressing towards goals, but decr attention/cognitive deficits are limiting factor.   Rehab Potential Fair   Current Impairments/barriers affecting progress: cognitive deficits, urinary catheter, severity of deficits   OT Frequency 1x / week   OT Duration --  21 visits total   OT Treatment/Interventions Self-care/ADL training;Therapeutic exercise;DME and/or AE instruction;Therapist, nutritional;Therapeutic activities;Patient/family education;Cognitive remediation/compensation;Neuromuscular education;Visual/perceptual remediation/compensation;Therapeutic exercises   Plan review pt's daily schedule if he brings in; check goal and renew next visit (request additional Medicaid visits)   Consulted and Agree with Plan of Care Patient;Family member/caregiver   Family Member Consulted grandmother--Sue, Mom      Patient will benefit from  skilled therapeutic intervention in order to improve the following deficits and impairments:  Decreased balance, Impaired vision/preception, Decreased cognition, Decreased knowledge of precautions, Impaired perceived functional ability, Impaired UE functional use, Decreased strength, Decreased safety awareness, Decreased knowledge of use of DME, Decreased coordination, Decreased activity tolerance  Visit Diagnosis: Attention and concentration deficit  Visuospatial deficit  Other lack of coordination  Frontal lobe and executive function deficit    Problem List Patient Active Problem List   Diagnosis Date Noted  . Seizure disorder ([place]) 09/12/2017  . Urinary retention 07/12/2017  . Acute renal insufficiency 07/12/2017  . Acute cystitis without hematuria   . Hypotension   . 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 Acute urinary retention 06/26/2017  . Staphylococcus epidermidis ventriculitis 06/26/2017  . Bacteremia due to Streptococcus pneumoniae 06/26/2017  . Bordetella pertussis 06/26/2017  . 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 Transaminitis   . Endotracheal tube present   . Status epilepticus ([place])   . History of syphilis   . Tobacco abuse   . Meningoencephalitis   . Cryptococcal meningitis ([place])   . Acute respiratory failure ([place])   . Seizure ([place]) 05/20/2017  . Peri-rectal abscess 03/06/2017  . 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 Anemia 09/12/2016  . 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 Bipolar disorder ([place]) 04/08/2013  . Diaper rash 10/10/2012    [facility] 10/03/2017, 5:14 PM  [facility] [address] [place], [place], [address] Phone: [phone]   Fax:  [phone]  Name: LONNELL CHAPUT MRN: [id number] Date of Birth: Mar 13, 1991   Vianne Bulls, OTR/L [facility] [address] [place], [place]  [address] [phone] phone [phone] 10/03/17 5:14 PM

## 2017-10-03 NOTE — Therapy (Signed)
[facility] [address] [place], [place], [address] Phone: [phone]   Fax:  [phone]  Speech Language Pathology Treatment  Patient Details  Name: Justin Ball MRN: [id number] Date of Birth: 16-Jul-1991 Referring Provider: Tommy Medal, Roderic Scarce, MD  Encounter Date: 10/03/2017      End of Session - 10/03/17 1514    Visit Number 8   Number of Visits 20   Date for SLP Re-Evaluation 10/19/17   Authorization Type AETNA - 25 visits combined - HARD LIMIT   Authorization Time Period medicaid approved 12 visits 09/27/17 to 11/07/18   Authorization - Visit Number 8   Authorization - Number of Visits 8   SLP Start Time 1232   SLP Stop Time  6222   SLP Time Calculation (min) 43 min   Activity Tolerance Patient tolerated treatment well      Past Medical History:  Diagnosis Date  . Acute renal insufficiency 07/12/2017  . Anxiety   . Bipolar 1 disorder ([place])   . Depression   . HIV infection ([place])   . Neuromuscular disorder ([place])   . Scabies   . 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 Substance abuse   . Syphilis     Past Surgical History:  Procedure Laterality Date  . EXAMINATION UNDER ANESTHESIA N/A 12/28/2014   Procedure: EXAM UNDER ANESTHESIA;  Surgeon: Michael Boston, MD;  [facility];  Service: General;  Laterality: N/A;  . FLOOR OF MOUTH BIOPSY N/A 09/16/2016   Procedure: BIOPSY OF ORAL ABCESS;  Surgeon: Leta Baptist, MD;  Location: [facility];  Service: ENT;  Laterality: N/A;  . INCISION AND DRAINAGE ABSCESS N/A 09/16/2016   Procedure: INCISION AND DRAINAGE ABSCESS;  Surgeon: Leta Baptist, MD;  Location: [facility];  Service: ENT;  Laterality: N/A;  . INCISION AND DRAINAGE PERIRECTAL ABSCESS  08/27/2010   Dr Marlou Starks  . INCISION AND DRAINAGE PERIRECTAL ABSCESS N/A 12/28/2014   Procedure: IRRIGATION AND DEBRIDEMENT PERIRECTAL ABSCESS;  Surgeon: Michael Boston, MD;  [facility];  Service: General;  Laterality: N/A;  Fistula repair and  ablation  . LAPAROSCOPIC REVISION VENTRICULAR-PERITONEAL (V-P) SHUNT N/A 06/12/2017   Procedure: LAPAROSCOPIC REVISION VENTRICULAR-PERITONEAL (V-P) SHUNT;  Surgeon: Kinsinger, Arta Bruce, MD;  Location: [facility];  Service: General;  Laterality: N/A;  . VENTRICULOPERITONEAL SHUNT N/A 06/12/2017   Procedure: SHUNT INSERTION VENTRICULAR-PERITONEAL;  Surgeon: Consuella Lose, MD;  Location: [facility];  Service: Neurosurgery;  Laterality: N/A;    There were no vitals filed for this visit.      Subjective Assessment - 10/03/17 1237    Subjective "We looked at a day program  - the only problem is he is incontinent - we think we found a place on [address]"   Patient is accompained by: Family member   Special Tests Collie Siad   Currently in Pain? No/denies               ADULT SLP TREATMENT - 10/03/17 1243      General Information   Behavior/Cognition Alert;Cooperative;Distractible;Requires cueing     Treatment Provided   Treatment provided Cognitive-Linquistic     Cognitive-Linquistic Treatment   Treatment focused on Cognition   Skilled Treatment Pt forgot folder - he reported success with simple meal prep with cues from grandmother. Sustained attention with card sort with frequent verbal cues for 12 minutes. Simple problem solving required frequent mod A - verbal and visual. Short term recall of 3 rules with visual, verbal and questioning cues. Extended time consistently  for processing. Pt instrucuted to get memory binder for recall of daily events and to keep his schedule, calendar, and exercises in. Pt and grandma trained in use of memory notebook to write down 2-4 events each day.      Assessment / Recommendations / Plan   Plan Continue with current plan of care     Progression Toward Goals   Progression toward goals Progressing toward goals          SLP Education - 10/03/17 1317    Education provided Yes   Education Details memory book with calendar, schedule, daily notes, exercises  - get 3 ring binder   Person(s) Educated Patient;Spouse   Methods Explanation;Handout;Demonstration   Comprehension Verbalized understanding          SLP Short Term Goals - 10/03/17 1513      SLP SHORT TERM GOAL #1   Title pt will demo sustained attention for 5 minutes, in 3 different tasks completed 90% success, in 3 sessions   Time 1   Period Weeks   Status On-going     SLP SHORT TERM GOAL #2   Title pt will tell SLP 2 cognitive defict areas with min A occasionallly, in three sessions   Time 1   Period Weeks   Status On-going     SLP SHORT TERM GOAL #3   Title pt will demo orientation to year, month, date with modified independence (calendar)   Time 1   Period Weeks   Status On-going     SLP SHORT TERM GOAL #4   Title pt will demo sustained attention to one task for 10 minutes to achieve 90% success   Time 1   Period Weeks   Status Not Met          SLP Long Term Goals - 10/03/17 1514      SLP LONG TERM GOAL #1   Title pt will demonstrate sustained attention to one task for 15 mintues in order to achieve 90% success with rare nonverbal cues back to task, in two sessions   Time 5   Period Weeks  or 20 visits   Status On-going     SLP LONG TERM GOAL #2   Title pt will demo emergent awareness in a simple paper/pencil cognitive linguistic task 60% of the time given pt double checking responses   Time 5   Period Weeks  or 20 visits   Status On-going     SLP LONG TERM GOAL #3   Title pt will demo orientation to month, day, date with modified independence over 8 sessions   Time 5   Period Weeks  or 20 visits   Status On-going          Plan - 10/03/17 1513    Clinical Impression Statement Pt continues to exhibit moderate to severe deficits in orientation, attention, recall, and verbal expression. Please see "skilled intervention" for further details. Continued ST intervention is recommended to maximize cognitive linguistic skills for improved safety and  independence, as well as decreased burden of care.      Patient will benefit from skilled therapeutic intervention in order to improve the following deficits and impairments:   Cognitive communication deficit    Problem List Patient Active Problem List   Diagnosis Date Noted  . Seizure disorder ([place]) 09/12/2017  . Urinary retention 07/12/2017  . Acute renal insufficiency 07/12/2017  . Acute cystitis without hematuria   . Hypotension   . 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 Acute urinary retention 06/26/2017  . Staphylococcus epidermidis ventriculitis 06/26/2017  . Bacteremia due to Streptococcus pneumoniae 06/26/2017  . Bordetella pertussis 06/26/2017  . 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)   . Transaminitis   . Endotracheal tube present   . Status epilepticus ([place])   . History of syphilis   . Tobacco abuse   . Meningoencephalitis   . Cryptococcal meningitis ([place])   . Acute respiratory failure ([place])   . Seizure ([place]) 05/20/2017  . Peri-rectal abscess 03/06/2017  . 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 Anemia 09/12/2016  . 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 Bipolar disorder ([place]) 04/08/2013  . Diaper rash 10/10/2012    Marisue Canion, Annye Rusk MS, CCC-SLP 10/03/2017, 3:15 PM  [facility] [address] [place], [place], [address] Phone: [phone]   Fax:  [phone]   Name: FERGUS THRONE MRN: [id number] Date of Birth: 1991-08-21

## 2017-10-03 NOTE — Patient Instructions (Signed)
   Start with 1 chore per week and add once Teondre is able to this successfully, then you can add another chore the next week.  Add this on your daily schedule. Examples:  - wiping a counter (kitchen or bathroom).  Encourage Urban to perform in a specific order (start here)  - folding or sorting clothes  - Wipe sink in bathroom  - Sweep small area  - Make bed:  Break down into steps and encourage him to do in the same order    Example:  1.  Remove pillows     2.  Pull up sheet     3.  Pull up bedspread     4.  Put pillows back on bed  - Wash plate   - Take dishes to sink

## 2017-10-03 NOTE — Patient Instructions (Signed)
   Memory Binder  Get 3 ring binder with notebook paper  Add schedule and therapy calendar  Date on top - write a few things that happened each day

## 2017-10-05 ENCOUNTER — Other Ambulatory Visit: Payer: Self-pay | Admitting: Urology

## 2017-10-08 ENCOUNTER — Encounter (HOSPITAL_BASED_OUTPATIENT_CLINIC_OR_DEPARTMENT_OTHER): Payer: Self-pay | Admitting: *Deleted

## 2017-10-08 NOTE — Progress Notes (Signed)
SPOKE W/ PT MOTHER, PT HAS MEMORY ISSUES.  PT GRANDMOTHER WILL BE WITH HIM DOS, YOU NEED HER DOS FOR MEDICATION'S. IN PRE-OP.  NPO AFTER MN.  ARRIVE AT 0915.  NEEDS ISTAT 8.  WILL TAKE AM MEDS WITHOUT FOOD DOS W/ SIPS OF WATER.

## 2017-10-10 ENCOUNTER — Ambulatory Visit (HOSPITAL_BASED_OUTPATIENT_CLINIC_OR_DEPARTMENT_OTHER): Payer: Medicaid Other | Admitting: Anesthesiology

## 2017-10-10 ENCOUNTER — Ambulatory Visit (HOSPITAL_BASED_OUTPATIENT_CLINIC_OR_DEPARTMENT_OTHER)
Admission: RE | Admit: 2017-10-10 | Discharge: 2017-10-10 | Disposition: A | Discharge status: Home / Self Care | Payer: Medicaid Other | Source: Ambulatory Visit | Attending: Urology | Admitting: Urology

## 2017-10-10 ENCOUNTER — Encounter (HOSPITAL_BASED_OUTPATIENT_CLINIC_OR_DEPARTMENT_OTHER)
Admission: RE | Disposition: A | Discharge status: Home / Self Care | Payer: Self-pay | Source: Ambulatory Visit | Attending: Urology

## 2017-10-10 ENCOUNTER — Encounter (HOSPITAL_BASED_OUTPATIENT_CLINIC_OR_DEPARTMENT_OTHER): Payer: Self-pay

## 2017-10-10 DIAGNOSIS — Z79899 Other long term (current) drug therapy: Secondary | ICD-10-CM | POA: Diagnosis not present

## 2017-10-10 DIAGNOSIS — I6931 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: Secondary | ICD-10-CM | POA: Insufficient documentation

## 2017-10-10 DIAGNOSIS — B20 Human immunodeficiency virus [HIV] disease: Secondary | ICD-10-CM | POA: Diagnosis not present

## 2017-10-10 DIAGNOSIS — F17210 Nicotine dependence, cigarettes, uncomplicated: Secondary | ICD-10-CM | POA: Diagnosis not present

## 2017-10-10 DIAGNOSIS — Z982 Presence of cerebrospinal fluid drainage device: Secondary | ICD-10-CM | POA: Insufficient documentation

## 2017-10-10 DIAGNOSIS — F419 Anxiety disorder, unspecified: Secondary | ICD-10-CM | POA: Diagnosis not present

## 2017-10-10 DIAGNOSIS — B181 Chronic viral hepatitis B without delta-agent: Secondary | ICD-10-CM | POA: Insufficient documentation

## 2017-10-10 DIAGNOSIS — N319 Neuromuscular dysfunction of bladder, unspecified: Secondary | ICD-10-CM | POA: Diagnosis present

## 2017-10-10 DIAGNOSIS — I69311 Memory deficit following cerebral infarction: Secondary | ICD-10-CM | POA: Diagnosis not present

## 2017-10-10 DIAGNOSIS — F319 Bipolar disorder, unspecified: Secondary | ICD-10-CM | POA: Insufficient documentation

## 2017-10-10 DIAGNOSIS — G40909 Epilepsy, unspecified, not intractable, without status epilepticus: Secondary | ICD-10-CM | POA: Diagnosis not present

## 2017-10-10 DIAGNOSIS — N318 Other neuromuscular dysfunction of bladder: Secondary | ICD-10-CM | POA: Diagnosis not present

## 2017-10-10 HISTORY — DX: Personal history of other infectious and parasitic diseases: Z86.19

## 2017-10-10 HISTORY — DX: Cognitive communication deficit: R41.841

## 2017-10-10 HISTORY — DX: Presence of other specified devices: Z97.8

## 2017-10-10 HISTORY — PX: INSERTION OF SUPRAPUBIC CATHETER: SHX5870

## 2017-10-10 HISTORY — DX: Neurogenic bowel, not elsewhere classified: K59.2

## 2017-10-10 HISTORY — DX: Personal history of transient ischemic attack (TIA), and cerebral infarction without residual deficits: Z86.73

## 2017-10-10 HISTORY — DX: Presence of cerebrospinal fluid drainage device: Z98.2

## 2017-10-10 HISTORY — DX: 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: I69.310

## 2017-10-10 HISTORY — DX: Chronic viral hepatitis B with delta-agent: B18.0

## 2017-10-10 HISTORY — DX: Disorder of the skin and subcutaneous tissue, unspecified: L98.9

## 2017-10-10 HISTORY — DX: Memory deficit following cerebral infarction: I69.311

## 2017-10-10 HISTORY — DX: Cerebral cryptococcosis: B45.1

## 2017-10-10 HISTORY — DX: Epilepsy, unspecified, not intractable, with status epilepticus: G40.901

## 2017-10-10 HISTORY — DX: Epilepsy, unspecified, not intractable, without status epilepticus: G40.909

## 2017-10-10 HISTORY — DX: Presence of urogenital implants: Z96.0

## 2017-10-10 HISTORY — DX: Human immunodeficiency virus (HIV) disease: B20

## 2017-10-10 HISTORY — DX: Frontal lobe and executive function deficit following cerebral infarction: I69.314

## 2017-10-10 HISTORY — DX: Neuromuscular dysfunction of bladder, unspecified: N31.9

## 2017-10-10 HISTORY — DX: Visuospatial deficit: R41.842

## 2017-10-10 HISTORY — PX: CYSTOSCOPY: SHX5120

## 2017-10-10 LAB — POCT I-STAT, CHEM 8
BUN: 11 mg/dL (ref 6–20)
CALCIUM ION: 1.21 mmol/L (ref 1.15–1.40)
CHLORIDE: 101 mmol/L (ref 101–111)
Creatinine, Ser: 1 mg/dL (ref 0.61–1.24)
Glucose, Bld: 95 mg/dL (ref 65–99)
HEMATOCRIT: 37 % — AB (ref 39.0–52.0)
Hemoglobin: 12.6 g/dL — ABNORMAL LOW (ref 13.0–17.0)
POTASSIUM: 3.5 mmol/L (ref 3.5–5.1)
SODIUM: 138 mmol/L (ref 135–145)
TCO2: 23 mmol/L (ref 22–32)

## 2017-10-10 SURGERY — INSERTION, SUPRAPUBIC CATHETER
Anesthesia: General

## 2017-10-10 MED ORDER — HYDROMORPHONE HCL-NACL 0.5-0.9 MG/ML-% IV SOSY
PREFILLED_SYRINGE | INTRAVENOUS | Status: AC
  Filled 2017-10-10: qty 1

## 2017-10-10 MED ORDER — DEXAMETHASONE SODIUM PHOSPHATE 10 MG/ML IJ SOLN
INTRAMUSCULAR | Status: AC
  Filled 2017-10-10: qty 1

## 2017-10-10 MED ORDER — FENTANYL CITRATE (PF) 100 MCG/2ML IJ SOLN
INTRAMUSCULAR | Status: AC
  Filled 2017-10-10: qty 2

## 2017-10-10 MED ORDER — TRAMADOL HCL 50 MG PO TABS: 50.0000 mg | ORAL_TABLET | Freq: Four times a day (QID) | ORAL | 0 refills | Status: DC | PRN

## 2017-10-10 MED ORDER — LIDOCAINE 2% (20 MG/ML) 5 ML SYRINGE
INTRAMUSCULAR | Status: AC
  Filled 2017-10-10: qty 5

## 2017-10-10 MED ORDER — GENTAMICIN SULFATE 40 MG/ML IJ SOLN
5.0000 mg/kg | INTRAVENOUS | Status: AC
  Administered 2017-10-10: 370 mg via INTRAVENOUS
  Filled 2017-10-10 (×2): qty 9.25

## 2017-10-10 MED ORDER — KETOROLAC TROMETHAMINE 30 MG/ML IJ SOLN
INTRAMUSCULAR | Status: AC
  Filled 2017-10-10: qty 1

## 2017-10-10 MED ORDER — LIDOCAINE 2% (20 MG/ML) 5 ML SYRINGE
INTRAMUSCULAR | Status: DC | PRN
  Administered 2017-10-10: 100 mg via INTRAVENOUS

## 2017-10-10 MED ORDER — TRAMADOL HCL 50 MG PO TABS
50.0000 mg | ORAL_TABLET | Freq: Once | ORAL | Status: AC
  Administered 2017-10-10: 50 mg via ORAL
  Filled 2017-10-10: qty 1

## 2017-10-10 MED ORDER — ONDANSETRON HCL 4 MG/2ML IJ SOLN
INTRAMUSCULAR | Status: AC
  Filled 2017-10-10: qty 2

## 2017-10-10 MED ORDER — PROPOFOL 10 MG/ML IV BOLUS
INTRAVENOUS | Status: DC | PRN
  Administered 2017-10-10: 50 mg via INTRAVENOUS
  Administered 2017-10-10: 200 mg via INTRAVENOUS
  Administered 2017-10-10 (×2): 50 mg via INTRAVENOUS

## 2017-10-10 MED ORDER — PROPOFOL 10 MG/ML IV BOLUS
INTRAVENOUS | Status: AC
  Filled 2017-10-10: qty 40

## 2017-10-10 MED ORDER — HYDROMORPHONE HCL 1 MG/ML IJ SOLN
0.2500 mg | INTRAMUSCULAR | Status: DC | PRN
  Administered 2017-10-10 (×4): 0.5 mg via INTRAVENOUS
  Filled 2017-10-10: qty 0.5

## 2017-10-10 MED ORDER — LACTATED RINGERS IV SOLN
INTRAVENOUS | Status: DC
  Administered 2017-10-10: 10:00:00 via INTRAVENOUS
  Filled 2017-10-10: qty 1000

## 2017-10-10 MED ORDER — TRAMADOL HCL 50 MG PO TABS
ORAL_TABLET | ORAL | Status: AC
  Filled 2017-10-10: qty 1

## 2017-10-10 MED ORDER — FENTANYL CITRATE (PF) 250 MCG/5ML IJ SOLN
INTRAMUSCULAR | Status: DC | PRN
  Administered 2017-10-10 (×2): 50 ug via INTRAVENOUS

## 2017-10-10 MED ORDER — PROMETHAZINE HCL 25 MG/ML IJ SOLN
6.2500 mg | INTRAMUSCULAR | Status: DC | PRN
  Filled 2017-10-10: qty 1

## 2017-10-10 MED ORDER — MIDAZOLAM HCL 2 MG/2ML IJ SOLN
INTRAMUSCULAR | Status: AC
  Filled 2017-10-10: qty 2

## 2017-10-10 MED ORDER — DEXAMETHASONE SODIUM PHOSPHATE 10 MG/ML IJ SOLN
INTRAMUSCULAR | Status: DC | PRN
  Administered 2017-10-10: 10 mg via INTRAVENOUS

## 2017-10-10 MED ORDER — KETOROLAC TROMETHAMINE 30 MG/ML IJ SOLN
INTRAMUSCULAR | Status: DC | PRN
  Administered 2017-10-10: 30 mg via INTRAVENOUS

## 2017-10-10 MED ORDER — ONDANSETRON HCL 4 MG/2ML IJ SOLN
INTRAMUSCULAR | Status: DC | PRN
  Administered 2017-10-10: 4 mg via INTRAVENOUS

## 2017-10-10 MED FILL — traMADol HCL 50 MG TABS: 50 | 2 days supply | Qty: 15 | Fill #0

## 2017-10-10 MED FILL — SULFAMETHOXAZOLE-TMP DS TAB: 800-160 | 2 days supply | Qty: 4 | Fill #0

## 2017-10-10 SURGICAL SUPPLY — 27 items
BAG DRAIN URO-CYSTO SKYTR STRL (DRAIN) ×3 IMPLANT
BAG URINE DRAINAGE (UROLOGICAL SUPPLIES) ×3 IMPLANT
BALLN NEPHROSTOMY (BALLOONS) ×3
BALLOON NEPHROSTOMY (BALLOONS) ×1 IMPLANT
BLADE SURG 15 STRL LF DISP TIS (BLADE) ×1 IMPLANT
BLADE SURG 15 STRL SS (BLADE) ×2
CATH SILICONE 5CC 18FR (INSTRUMENTS) ×3 IMPLANT
CLOTH BEACON ORANGE TIMEOUT ST (SAFETY) ×3 IMPLANT
GLOVE BIO SURGEON STRL SZ7.5 (GLOVE) ×6 IMPLANT
GLOVE ECLIPSE 8.0 STRL XLNG CF (GLOVE) ×6 IMPLANT
GOWN STRL REUS W/ TWL LRG LVL3 (GOWN DISPOSABLE) ×3 IMPLANT
GOWN STRL REUS W/ TWL XL LVL3 (GOWN DISPOSABLE) ×1 IMPLANT
GOWN STRL REUS W/TWL LRG LVL3 (GOWN DISPOSABLE) ×6
GOWN STRL REUS W/TWL XL LVL3 (GOWN DISPOSABLE) ×2
GUIDEWIRE STR DUAL SENSOR (WIRE) ×3 IMPLANT
KIT RM TURNOVER CYSTO AR (KITS) ×3 IMPLANT
MANIFOLD NEPTUNE II (INSTRUMENTS) ×3 IMPLANT
NEEDLE SPNL 18GX3.5 QUINCKE PK (NEEDLE) ×3 IMPLANT
NEEDLE TROCAR 18X15 ECHO (NEEDLE) ×3 IMPLANT
NS IRRIG 500ML POUR BTL (IV SOLUTION) IMPLANT
PACK CYSTO (CUSTOM PROCEDURE TRAY) ×3 IMPLANT
SUT SILK 2 0 30  PSL (SUTURE) ×2
SUT SILK 2 0 30 PSL (SUTURE) ×1 IMPLANT
TROCAR NEEDLE ×3 IMPLANT
TUBE CONNECTING 12'X1/4 (SUCTIONS) ×1
TUBE CONNECTING 12X1/4 (SUCTIONS) ×2 IMPLANT
WATER STERILE IRR 3000ML UROMA (IV SOLUTION) ×6 IMPLANT

## 2017-10-10 NOTE — Anesthesia Procedure Notes (Signed)
Procedure Name: LMA Insertion Date/Time: 10/10/2017 10:48 AM Performed by: Tyrone NineSAUVE, Barbra Miner F Pre-anesthesia Checklist: Patient identified, Timeout performed, Emergency Drugs available, Suction available and Patient being monitored Patient Re-evaluated:Patient Re-evaluated prior to induction Oxygen Delivery Method: Circle system utilized Preoxygenation: Pre-oxygenation with 100% oxygen Induction Type: IV induction Ventilation: Mask ventilation without difficulty LMA: LMA inserted LMA Size: 4.0 Number of attempts: 1 Placement Confirmation: positive ETCO2 and breath sounds checked- equal and bilateral Tube secured with: Tape Dental Injury: Teeth and Oropharynx as per pre-operative assessment

## 2017-10-10 NOTE — Discharge Instructions (Signed)
°  Post Anesthesia Home Care Instructions ° °Activity: °Get plenty of rest for the remainder of the day. A responsible individual must stay with you for 24 hours following the procedure.  °For the next 24 hours, DO NOT: °-Drive a car °-Operate machinery °-Drink alcoholic beverages °-Take any medication unless instructed by your physician °-Make any legal decisions or sign important papers. ° °Meals: °Start with liquid foods such as gelatin or soup. Progress to regular foods as tolerated. Avoid greasy, spicy, heavy foods. If nausea and/or vomiting occur, drink only clear liquids until the nausea and/or vomiting subsides. Call your physician if vomiting continues. ° °Special Instructions/Symptoms: °Your throat may feel dry or sore from the anesthesia or the breathing tube placed in your throat during surgery. If this causes discomfort, gargle with warm salt water. The discomfort should disappear within 24 hours. ° °If you had a scopolamine patch placed behind your ear for the management of post- operative nausea and/or vomiting: ° °1. The medication in the patch is effective for 72 hours, after which it should be removed.  Wrap patch in a tissue and discard in the trash. Wash hands thoroughly with soap and water. °2. You may remove the patch earlier than 72 hours if you experience unpleasant side effects which may include dry mouth, dizziness or visual disturbances. °3. Avoid touching the patch. Wash your hands with soap and water after contact with the patch. °  ° °1 - You may have urinary urgency (bladder spasms) and bloody urine on / off x few days. This is normal. ° °2 - Call MD or go to ER for fever >102, severe pain / nausea / vomiting not relieved by medications, or acute change in medical status ° °

## 2017-10-10 NOTE — Brief Op Note (Signed)
10/10/2017  11:32 AM  PATIENT:  Justin Ball  26 y.o. male  PRE-OPERATIVE DIAGNOSIS:  Neurogenic bladder   POST-OPERATIVE DIAGNOSIS:  Neurogenic bladder  PROCEDURE:  Procedure(s): INSERTION OF SUPRAPUBIC CATHETER (N/A) CYSTOSCOPY (N/A)  SURGEON:  Surgeon(s) and Role:    Sebastian Ache* Shaunna Rosetti, MD - Primary  PHYSICIAN ASSISTANT:   ASSISTANTS: none   ANESTHESIA:   general  EBL:  minimal   BLOOD ADMINISTERED:none  DRAINS: 84F SPT to gravity drainage   LOCAL MEDICATIONS USED:  NONE  SPECIMEN:  No Specimen  DISPOSITION OF SPECIMEN:  N/A  COUNTS:  YES  TOURNIQUET:  * No tourniquets in log *  DICTATION: .Other Dictation: Dictation Number [id number]  PLAN OF CARE: Discharge to home after PACU  PATIENT DISPOSITION:  PACU - hemodynamically stable.   Delay start of Pharmacological VTE agent (>24hrs) due to surgical blood loss or risk of bleeding: yes

## 2017-10-10 NOTE — Interval H&P Note (Signed)
History and Physical Interval Note:  10/10/2017 10:29 AM  Justin Ball  has presented today for surgery, with the diagnosis of NEUROGENIC BLADDER  The various methods of treatment have been discussed with the patient and family. After consideration of risks, benefits and other options for treatment, the patient has consented to  Procedure(s): INSERTION OF SUPRAPUBIC CATHETER (N/A) CYSTOSCOPY (N/A) as a surgical intervention .  The patient's history has been reviewed, patient examined, no change in status, stable for surgery.  I have reviewed the patient's chart and labs.  Questions were answered to the patient's satisfaction.     [name]

## 2017-10-10 NOTE — H&P (Signed)
Justin Ball is an 26 y.o. male.    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 Bladder initially managed with foley as failed TOV in hospital. He is still minimally communicative but has very supportive family providing 24 hr assist. Baseline UDS 08/2017 confirm completely acontractile bladder.    Recent Course -  - chronic foley. He cannot perform self cath and does not have addequte family support for any sort of team cath.   PMH sig for VP shunt, AIDS (variable compliance), neurosyphillis, failure to thrive. He follows with Dr. [name] with ID.   Today "Justin Ball" is seen to proceed with SP Tube placement. NO interval high grade fevers. He has bene on Bactrim pre-op as RX'd. He does have very slight cough / nasal congestion.    Past Medical History:  Diagnosis Date  . 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 Anxiety   . 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 Bipolar 1 disorder (HCC)   . 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 Cognitive communication deficit    working w/ therapy  . 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 Depression   . 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 Foley catheter in place   . 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 History of scabies 2013  . History of syphilis   . Memory deficit after cerebral infarction     due to infection  . 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 Neurogenic bowel    wears depends  . Neuromuscular disorder (HCC)   . 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 Seizure disorder [facility])    neurologist-  dr Marjory Lies ([facility])--caused by cryptococcal meningitis/ multiple infarctions--  no seizure since hospitalization May 2018  . Status epilepticus (HCC)   . Visuospatial deficit     Past Surgical History:  Procedure Laterality Date  . EXAMINATION UNDER ANESTHESIA N/A 12/28/2014   Procedure: EXAM UNDER ANESTHESIA;  Surgeon: Karie Soda, MD;  Location: [facility];  Service: General;  Laterality: N/A;  . FLOOR OF MOUTH BIOPSY N/A 09/16/2016   Procedure: BIOPSY OF ORAL ABCESS;  Surgeon: Newman Pies, MD;  [facility]: [facility];  Service: ENT;  Laterality: N/A;  . INCISION AND DRAINAGE ABSCESS N/A 09/16/2016   Procedure: INCISION AND DRAINAGE ABSCESS;  Surgeon: Newman Pies, MD;  [facility]: [facility];  Service: ENT;  Laterality: N/A;  . INCISION AND DRAINAGE PERIRECTAL ABSCESS  09/ 03/ 2011   dr toth  . INCISION AND DRAINAGE PERIRECTAL ABSCESS N/A 12/28/2014   Procedure: IRRIGATION AND DEBRIDEMENT PERIRECTAL ABSCESS;  Surgeon: Karie Soda, MD;  Location: [facility];  Service: General;  Laterality: N/A;  Fistula repair and ablation  . LAPAROSCOPIC REVISION VENTRICULAR-PERITONEAL (V-P) SHUNT N/A 06/12/2017   Procedure: LAPAROSCOPIC REVISION VENTRICULAR-PERITONEAL (V-P) SHUNT;  Surgeon: Kinsinger, De Blanch, MD;  Location: [facility];  Service: General;  Laterality: N/A;  .  TRANSTHORACIC ECHOCARDIOGRAM  06/02/2017   EF 65-70%/  trivial PR  . VENTRICULOPERITONEAL SHUNT N/A 06/12/2017   Procedure: SHUNT INSERTION VENTRICULAR-PERITONEAL;  Surgeon: Lisbeth RenshawNundkumar, Neelesh, MD;  Location: [facility];  Service: Neurosurgery;  Laterality: N/A;    Family History  Problem Relation Age of Onset  . Hypertension Other    Social History:  reports that he has been smoking Cigarettes.  He has a 2.50 pack-year smoking  history. He has never used smokeless tobacco. He reports that he does not drink alcohol or use drugs.  Allergies: No Known Allergies  No prescriptions prior to admission.    No results found for this or any previous visit (from the past 48 hour(s)). No results found.  Review of Systems  Constitutional: Negative.  Negative for chills and fever.  HENT: Negative.   Eyes: Negative.   Respiratory: Positive for cough and sputum production. Negative for hemoptysis, shortness of breath and wheezing.   Cardiovascular: Negative.   Gastrointestinal: Negative.   Genitourinary: Negative.   Musculoskeletal: Negative.   Skin: Negative.   Neurological: Positive for sensory change and speech change.  Endo/Heme/Allergies: Negative.   Psychiatric/Behavioral: Negative.     Height 6' (1.829 m), weight 68.9 kg (152 lb). Physical Exam  Constitutional:  Stigmata of chronic neurologic disease. Very slowed mentation and movement.   Eyes: Pupils are equal, round, and reactive to light.  Neck: Normal range of motion.  Cardiovascular: Normal rate.   Respiratory: Effort normal.  GI: Soft.  Genitourinary:  Genitourinary Comments: Catheter in place with yellow urine  Musculoskeletal: Normal range of motion.  Skin: Skin is warm.     Assessment/Plan  Proceed as planned with cysto and SP Tube placement. Risks, benefits, alternatives, expected peri-op course discussed previously with pt an mother and reiterated today.   Sebastian AcheMANNY, Hendricks Schwandt, MD 10/10/2017, 8:26 AM

## 2017-10-10 NOTE — Anesthesia Postprocedure Evaluation (Signed)
Anesthesia Post Note  Patient: Justin Ball  Procedure(s) Performed: INSERTION OF SUPRAPUBIC CATHETER (N/A ) CYSTOSCOPY (N/A )     Patient location during evaluation: PACU Anesthesia Type: General Level of consciousness: sedated Pain management: pain level controlled Vital Signs Assessment: post-procedure vital signs reviewed and stable Respiratory status: spontaneous breathing and respiratory function stable Cardiovascular status: stable Postop Assessment: no apparent nausea or vomiting Anesthetic complications: no    Last Vitals:  Vitals:   10/10/17 1230 10/10/17 1245  BP: 115/80 130/72  Pulse: 88 90  Resp: 17 18  Temp:    SpO2: 95% 96%    Last Pain:  Vitals:   10/10/17 0917  TempSrc: Oral                 Estephania Licciardi DANIEL

## 2017-10-10 NOTE — Transfer of Care (Signed)
Immediate Anesthesia Transfer of Care Note  Patient: Justin Ball  Procedure(s) Performed: INSERTION OF SUPRAPUBIC CATHETER (N/A ) CYSTOSCOPY (N/A )  Patient Location: PACU  Anesthesia Type:General  Level of Consciousness: awake, alert , oriented and patient cooperative  Airway & Oxygen Therapy: Patient Spontanous Breathing and Patient connected to nasal cannula oxygen  Post-op Assessment: Report given to RN and Post -op Vital signs reviewed and stable  Post vital signs: Reviewed and stable  Last Vitals:  Vitals:   10/10/17 0917  BP: 110/73  Pulse: (!) 110  Resp: 16  Temp: 37.7 C  SpO2: 100%    Last Pain:  Vitals:   10/10/17 0917  TempSrc: Oral      Patients Stated Pain Goal: 2 (10/10/17 1002)  Complications: No apparent anesthesia complications

## 2017-10-10 NOTE — Anesthesia Preprocedure Evaluation (Addendum)
Anesthesia Evaluation  Patient identified by MRN, date of birth, ID band Patient awake    Reviewed: Allergy & Precautions, NPO status , Patient's Chart, lab work & pertinent test results  History of Anesthesia Complications Negative for: history of anesthetic complications  Airway Mallampati: II  TM Distance: >3 FB Neck ROM: Full    Dental  (+) Teeth Intact   Pulmonary Current Smoker,    breath sounds clear to auscultation       Cardiovascular negative cardio ROS   Rhythm:Regular     Neuro/Psych  Headaches, Seizures -, Well Controlled,  PSYCHIATRIC DISORDERS Anxiety Depression Bipolar Disorder Cryptococcus meningitis, VP shunt  Neuromuscular disease negative psych ROS   GI/Hepatic negative GI ROS, (+) Hepatitis -  Endo/Other  negative endocrine ROS  Renal/GU Renal diseasenegative Renal ROS     Musculoskeletal negative musculoskeletal ROS (+)   Abdominal   Peds  Hematology  (+) anemia , HIV,   Anesthesia Other Findings   Reproductive/Obstetrics                           Anesthesia Physical  Anesthesia Plan  ASA: III  Anesthesia Plan: General   Post-op Pain Management:    Induction: Intravenous  PONV Risk Score and Plan: 1 and 2 and Ondansetron, Dexamethasone and Treatment may vary due to age or medical condition  Airway Management Planned: Oral ETT and LMA  Additional Equipment: None  Intra-op Plan:   Post-operative Plan: Extubation in OR  Informed Consent: I have reviewed the patients History and Physical, chart, labs and discussed the procedure including the risks, benefits and alternatives for the proposed anesthesia with the patient or authorized representative who has indicated his/her understanding and acceptance.   Dental advisory given  Plan Discussed with: CRNA and Surgeon  Anesthesia Plan Comments:         Anesthesia Quick Evaluation

## 2017-10-11 ENCOUNTER — Ambulatory Visit: Payer: Medicaid Other | Admitting: Speech Pathology

## 2017-10-11 ENCOUNTER — Encounter (HOSPITAL_BASED_OUTPATIENT_CLINIC_OR_DEPARTMENT_OTHER): Payer: Self-pay | Admitting: Urology

## 2017-10-11 MED FILL — ODEFSEY 200-25-25 MG TABS: 200-25-25 | 30 days supply | Qty: 30 | Fill #3

## 2017-10-11 MED FILL — TIVICAY 50 MG TABLET: 50 | 30 days supply | Qty: 30 | Fill #3

## 2017-10-11 NOTE — Op Note (Signed)
NAME:  Justin Ball, Justin Ball                     ACCOUNT NO.:  MEDICAL RECORD NO.:  [id number]  LOCATION:                                 FACILITY:  PHYSICIAN:  Sebastian Acheheodore Amiri Tritch, MD     DATE OF BIRTH:  12/03/1991  DATE OF PROCEDURE: 10/10/2017                              OPERATIVE REPORT   DIAGNOSIS:  Neurogenic bladder.  PROCEDURE:  Cystoscopy with suprapubic tube placement.  ESTIMATED BLOOD LOSS:  Nil.  COMPLICATION:  None.  SPECIMEN:  None.  DRAINS:  An 18-French Silicone suprapubic tube to gravity drainage.  FINDINGS: 1. Unremarkable urethra and urinary bladder. 2. Successful placement of suprapubic tube in the anterior superior     bladder.  INDICATION:  [name] is a very unfortunate 26 year old young man with history of advanced AIDS with also neurosyphilis with severe neurologic compromise from this.  He has neurogenic bladder from this.  He underwent urodynamic studies, which corroborated this.  Options were discussed for further bladder management including chronic Foley catheter versus suprapubic tube versus urinary diversion and the patient and his family wished to proceed with suprapubic tube placement. Informed consent was obtained and placed in the medical record.  PROCEDURE IN DETAIL:  The patient being Justin Ball, was verified. Procedure being cysto and suprapubic tube placement was confirmed. Procedure was carried out.  Time-out was performed.  Intravenous antibiotics were administered.  General anesthesia was introduced.  The patient was placed into a low lithotomy position, and sterile field was created by prepping and draping the patient's penis, perineum, proximal thighs and infraumbilical abdomen using iodine.  Next, cystourethroscopy was performed using a 22-French rigid cystoscope with offset lens. Inspection of the anterior and posterior urethra unremarkable. Inspection of the urinary bladder revealed some mild bullous changes of the posterior wall  consistent with chronic Foley, no papular lesions or calcifications were noted.  Ureteral orifices were singleton bilaterally.  The bladder was then filled to the cystometric capacity, an 18-gauge spinal needle was introduced and positioned approximately 2 fingerbreadths superior to the pubic symphysis in the midline and this was visualized cystoscopically to be the anterior superior bladder. Through this, a Sensor wire was advanced and then, a skin incision was made approximately 1.5 cm down through muscle in the midline and the 24- JamaicaFrench balloon dilation apparatus was carefully advanced across this as verified cystoscopically, inflated to pressure of 20 atmospheres, and the sheath placed across into the level of the bladder and the balloon was removed.  Then, an 18-French catheter was placed through this into the urinary bladder.  10 mL of sterile water was placed in the balloon, this was visualized cystoscopically.  The sheath was removed. Bladder was emptied via the suprapubic tube.  Drain stitch was applied, procedure was terminated.  The patient tolerated the procedure well. There were no immediate periprocedural complications.  The patient was taken to the postanesthesia care unit in stable condition.          ______________________________ Sebastian Acheheodore Manal Kreutzer, MD     TM/MEDQ  D:  10/10/2017  T:  10/10/2017  Job:  [id number]

## 2017-10-12 ENCOUNTER — Ambulatory Visit: Payer: Medicaid Other

## 2017-10-15 ENCOUNTER — Encounter: Payer: Self-pay | Admitting: Infectious Disease

## 2017-10-15 ENCOUNTER — Ambulatory Visit: Payer: Medicaid Other | Admitting: Occupational Therapy

## 2017-10-15 ENCOUNTER — Ambulatory Visit (INDEPENDENT_AMBULATORY_CARE_PROVIDER_SITE_OTHER): Payer: No Typology Code available for payment source | Admitting: Infectious Disease

## 2017-10-15 VITALS — BP 96/68 | HR 88 | Temp 98.8°F | Ht 73.0 in | Wt 166.0 lb

## 2017-10-15 DIAGNOSIS — Z982 Presence of cerebrospinal fluid drainage device: Secondary | ICD-10-CM | POA: Diagnosis not present

## 2017-10-15 DIAGNOSIS — D013 Carcinoma in situ of anus and anal canal: Secondary | ICD-10-CM | POA: Diagnosis not present

## 2017-10-15 DIAGNOSIS — R339 Retention of urine, unspecified: Secondary | ICD-10-CM

## 2017-10-15 DIAGNOSIS — F3170 Bipolar disorder, currently in remission, most recent episode unspecified: Secondary | ICD-10-CM

## 2017-10-15 DIAGNOSIS — B20 Human immunodeficiency virus [HIV] disease: Secondary | ICD-10-CM

## 2017-10-15 DIAGNOSIS — B180 Chronic viral hepatitis B with delta-agent: Secondary | ICD-10-CM

## 2017-10-15 DIAGNOSIS — B451 Cerebral cryptococcosis: Secondary | ICD-10-CM

## 2017-10-15 NOTE — Patient Instructions (Signed)
We will get blood work today and then would like you to come to an appt with me on October 31st and we can review blood work and see how you are doing  Make sure you are taking  TIVICAY (little yellow pill)   And  ODEFSEY (gray pill) EVERY SINGLE DAY WITH MEAL  You also need to take fluconazole 400mg  daily for the fungal infection around your brain   And  Bactrim DS three times weekly to prevent PCP pneumonia  Finally please make sure you take your seizure medicine twice day

## 2017-10-15 NOTE — Progress Notes (Addendum)
Subjective:   Chief complaint: Follow-up for HIV disease AIDS and cryptococcal meningitis   Patient ID: Justin Ball, male    DOB: 07-13-1991, 26 y.o.   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 He has been on fluconazole but I learned at last visit from [facility] that he had not picked up rx for fluconazole since July because his mother did not "ask for this" vs the specialty items which pharmacy prompts pt to fill.  He had been without headaches or other symptoms to suggest recurrence. He was having about 5 bm per day which is causing pain in his rectal area.  He is on ODEFSEY and Tivicay which he is taking with meals.   Unfortunately though he has not been adherent to these medications as we had hoped and his viral load actually rose to 6970.  Genotyping showed no evidence of emerging resistance consistent with someone not being compliant with her therapy as directed.  Since that visit Justin Ball has visited with him and his mother and aunt and emphasized need to take aRV every day with breakfast. They DID not bring meds with them which makes things difficult since it is not clear to me that he is taking everything that is being rx.   He does have less irritation on his buttocks since he has been applying the diaper rash cream.  He still does suffer from fecal incontinence.  He has had a suprapubic catheter placed by urology.  Lab Results  Component Value Date   HIV1RNAQUANT 6,970 (H) 09/13/2017   HIV1RNAQUANT 66 (H) 08/02/2017   HIV1RNAQUANT 588 (H) 07/10/2017   Lab Results  Component Value Date   CD4TABS 110 (L) 09/13/2017   CD4TABS 50 (L) 08/02/2017   CD4TABS  07/10/2017    THIS TEST WAS RESULTED IN ERROR. PLEASE DISREGARD RESULTS.   CD4TABS 30 (L) 07/10/2017   Past  Medical History:  Diagnosis Date  . 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 Anxiety   . 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 Bipolar 1 disorder (HCC)   . 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 Cognitive communication deficit    working w/ therapy  . 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 Depression   . 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 Foley catheter in place   . 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 History of scabies 2013  . History of syphilis   . 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 Neurogenic bowel    wears depends  . Neuromuscular disorder (HCC)   . 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 Seizure disorder [facility])    neurologist-  dr Marjory Lies ([facility])--caused by cryptococcal meningitis/ multiple infarctions--  no seizure since  hospitalization May 2018  . Status epilepticus (HCC)   . Visuospatial deficit     Past Surgical History:  Procedure Laterality Date  . CYSTOSCOPY N/A 10/10/2017   Procedure: CYSTOSCOPY;  Surgeon: Sebastian AcheManny, Theodore, MD;  [facility];  Service: Urology;  Laterality: N/A;  . EXAMINATION UNDER ANESTHESIA N/A 12/28/2014   Procedure: EXAM UNDER ANESTHESIA;  Surgeon: Karie SodaSteven Gross, MD;  [facility];  Service: General;  Laterality: N/A;  . FLOOR OF MOUTH BIOPSY N/A 09/16/2016   Procedure: BIOPSY OF ORAL ABCESS;  Surgeon: Newman PiesSu Teoh, MD;   [facility];  Service: ENT;  Laterality: N/A;  . INCISION AND DRAINAGE ABSCESS N/A 09/16/2016   Procedure: INCISION AND DRAINAGE ABSCESS;  Surgeon: Newman PiesSu Teoh, MD;  [facility];  Service: ENT;  Laterality: N/A;  . INCISION AND DRAINAGE PERIRECTAL ABSCESS  09/ 03/ 2011   dr toth  . INCISION AND DRAINAGE PERIRECTAL ABSCESS N/A 12/28/2014   Procedure: IRRIGATION AND DEBRIDEMENT PERIRECTAL ABSCESS;  Surgeon: Karie SodaSteven Gross, MD;  [facility];  Service: General;  Laterality: N/A;  Fistula repair and ablation  . INSERTION OF SUPRAPUBIC CATHETER N/A 10/10/2017   Procedure: INSERTION OF SUPRAPUBIC CATHETER;  Surgeon: Sebastian AcheManny, Theodore, MD;  [facility];  Service: Urology;  Laterality: N/A;  . LAPAROSCOPIC REVISION VENTRICULAR-PERITONEAL (V-P) SHUNT N/A 06/12/2017   Procedure: LAPAROSCOPIC REVISION VENTRICULAR-PERITONEAL (V-P) SHUNT;  Surgeon: Kinsinger, De BlanchLuke Aaron, MD;  [facility];  Service: General;  Laterality: N/A;  . TRANSTHORACIC ECHOCARDIOGRAM  06/02/2017   EF 65-70%/  trivial PR  . VENTRICULOPERITONEAL SHUNT N/A 06/12/2017   Procedure: SHUNT INSERTION VENTRICULAR-PERITONEAL;  Surgeon: Lisbeth RenshawNundkumar, Neelesh, MD;  [facility];  Service: Neurosurgery;  Laterality: N/A;    Family History  Problem Relation Age of Onset  . Hypertension Other       Social History   Social History  . Marital status: Single    Spouse name: N/A  . Number of children: N/A  . Years of education: N/A   Social History Main Topics  . Smoking status: Current Every Day Smoker    Packs/day: 0.50    Years: 5.00    Types: Cigarettes  . Smokeless tobacco: Never Used     Comment: cutting back  . Alcohol use No  . Drug use: No  . Sexual activity: Yes    Partners: Male    Birth control/ protection: Condom     Comment: irregular condom use; educated   Other Topics Concern  . None   Social History Narrative  . None    No Known Allergies   Current Outpatient Prescriptions:  .   acetaminophen (TYLENOL) 325 MG tablet, Take 2 tablets (650 mg total) by mouth every 4 (four) hours as needed for mild pain (temp > 101.5)., Disp: , Rfl:  .  dolutegravir (TIVICAY) 50 MG tablet, Take 1 tablet (50 mg total) by mouth daily. (Patient taking differently: Take 50 mg by mouth every morning. ), Disp: 30 tablet, Rfl: 6 .  emtricitabine-rilpivir-tenofovir AF (ODEFSEY) 200-25-25 MG TABS tablet, Take 1 tablet by mouth daily with breakfast. (Patient taking differently: Take 1 tablet by mouth daily with breakfast. ), Disp: 30 tablet, Rfl: 6 .  fluconazole (DIFLUCAN) 200 MG tablet, Take 2 tablets (400 mg total) by mouth daily., Disp: 60 tablet, Rfl: 3 .  levETIRAcetam (KEPPRA) 1000 MG tablet, Take 1 tablet (1,000 mg total) by mouth 2 (two) times daily. (Patient taking differently: Take 1,000 mg by mouth 2 (two)  times daily. ), Disp: 60 tablet, Rfl: 5 .  QUEtiapine (SEROQUEL) 25 MG tablet, Take 1 tablet (25 mg total) by mouth 2 (two) times daily as needed (agitation). (Patient taking differently: Take 25 mg by mouth 2 (two) times daily as needed (agitation). ), Disp: 60 tablet, Rfl: 4 .  Skin Protectants, Misc. (PERIGUARD) OINT, Apply 1 application topically as needed., Disp: 3 Tube, Rfl: 2 .  sulfamethoxazole-trimethoprim (BACTRIM DS,SEPTRA DS) 800-160 MG tablet, Take 1 tablet by mouth 3 (three) times a week., Disp: 12 tablet, Rfl: 6 .  traMADol (ULTRAM) 50 MG tablet, Take 1-2 tablets (50-100 mg total) by mouth every 6 (six) hours as needed for moderate pain or severe pain. Post-operatively., Disp: 15 tablet, Rfl: 0   Review of Systems  Unable to perform ROS: Dementia       Objective:   Physical Exam  Constitutional: No distress.  HENT:  Head: Normocephalic.  Mouth/Throat: Oropharynx is clear and moist. No oropharyngeal exudate.  Eyes: EOM are normal. No scleral icterus.  Neck: Normal range of motion. No JVD present. No tracheal deviation present.  Cardiovascular: Normal rate and regular  rhythm.   Pulmonary/Chest: Effort normal. No respiratory distress. He has no wheezes.  Abdominal: Soft. Bowel sounds are normal. He exhibits no distension.  Musculoskeletal: Normal range of motion. He exhibits no edema or deformity.  Neurological: He is alert. He has normal strength. No cranial nerve deficit or sensory deficit. Gait normal.  Oriented to person and place  Skin: No rash noted. He is not diaphoretic. No erythema.  Psychiatric: His speech is delayed. He is slowed. Cognition and memory are impaired.  Justin Ball seemed slightly sharper than the last time I saw him        Assessment & Plan:   HIV/AIDS: recheck labs today. 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 This is still being worked out.  I spent greater than 25 minutes with the patient including greater than 50% of time in face to face counsel of the patient and his aunt with regards to how he should be taking his antiretroviral medications every day and how we would proceed if he was showing lack of viral suppression consequences of virological failure with resistance consequences of not treating his HIV aggressively and in coordination of his care.  NOTE: THIS IS TO DOCUMENT THAT I DID DISCUSS WITH Justin Ball AND HIS GRANDMOTHER THAT HE WOULD CONTINUE TO NEED DIAPERS WITH THIS FECAL INCONTINENCE. HE HAD SEVERE PAINFUL RASH SEVERAL VISITS AGO THAT IMPROVED WITH DIAPER RASH CREAM AND CONTINUED USE OF ADULT DIAPERS.

## 2017-10-16 ENCOUNTER — Ambulatory Visit: Payer: Medicaid Other | Admitting: Occupational Therapy

## 2017-10-16 DIAGNOSIS — M6281 Muscle weakness (generalized): Secondary | ICD-10-CM

## 2017-10-16 DIAGNOSIS — R41844 Frontal lobe and executive function deficit: Secondary | ICD-10-CM

## 2017-10-16 DIAGNOSIS — R41840 Attention and concentration deficit: Secondary | ICD-10-CM | POA: Diagnosis not present

## 2017-10-16 DIAGNOSIS — R2681 Unsteadiness on feet: Secondary | ICD-10-CM

## 2017-10-16 DIAGNOSIS — R41842 Visuospatial deficit: Secondary | ICD-10-CM

## 2017-10-16 DIAGNOSIS — R278 Other lack of coordination: Secondary | ICD-10-CM

## 2017-10-16 NOTE — Therapy (Addendum)
[facility] [address] [place], [place], [address] Phone: [phone]   Fax:  [phone]  Occupational Therapy Treatment  Patient Details  Name: Justin Ball MRN: [id number] Date of Birth: 1991/04/12 Referring Provider: Dr. Rhina Brackett Dam  Encounter Date: 10/16/2017      OT End of Session - 10/16/17 1715    Visit Number 11   Number of Visits 16  11+5=16   Date for OT Re-Evaluation 11/30/17   Authorization Type Aet[employer]--25 visits combined hard limit; Grandmother reports Medicaid approved 09/18/17--awaiting authorization   Authorization Time Period Aetna--25 visits combined (PT used 3).  3 visits approved by MCD through 10/27/17-- Today is 3/3 approved; awaiting approval for additional Medicaid visits   Authorization - Visit Number 11  A[employer]na   Authorization - Number of Visits 11   OT Start Time 1450   OT Stop Time 1535   OT Time Calculation (min) 45 min   Activity Tolerance Patient tolerated treatment well   Behavior During Therapy WFL for tasks assessed/performed      Past Medical History:  Diagnosis Date  . 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 Anxiety   . 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 Bipolar 1 disorder ([place])   . Chronic viral hepatitis B without coma and with delta agent ([place])   . Cognitive communication deficit    working w/ therapy  . 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 Depression   . 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 Foley catheter in place   . 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 History of cerebral infarction  05/20/2017   multiple ischemic infarction secondary to infection  . History of scabies 2013  . History of syphilis   . 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 Neurogenic bowel    wears depends  . Neuromuscular disorder ([place])   . 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 Seizure disorder [facility])    neurologist-  dr Leta Baptist ([facility])--caused by cryptococcal meningitis/ multiple infarctions--  no seizure since hospitalization May 2018  . Status epilepticus ([place])   . Visuospatial deficit     Past Surgical History:  Procedure Laterality Date  . CYSTOSCOPY N/A 10/10/2017   Procedure: CYSTOSCOPY;  Surgeon: Alexis Frock, MD;  [facility]  Service: Urology;  Laterality: N/A;  . EXAMINATION UNDER ANESTHESIA N/A 12/28/2014   Procedure: EXAM UNDER ANESTHESIA;  Surgeon: Michael Boston, MD;  Location: [facility];  Service: General;  Laterality: N/A;  . FLOOR OF MOUTH BIOPSY N/A 09/16/2016   Procedure: BIOPSY OF ORAL ABCESS;  Surgeon: Leta Baptist, MD;  Location: [facility];  Service: ENT;  Laterality: N/A;  . INCISION AND DRAINAGE ABSCESS N/A 09/16/2016   Procedure: INCISION AND DRAINAGE ABSCESS;  Surgeon: Leta Baptist, MD;  Location: [facility];  Service: ENT;  Laterality: N/A;  . [place]  09/ 03/ 2011   dr toth  . INCISION AND DRAINAGE PERIRECTAL ABSCESS N/A 12/28/2014   Procedure: IRRIGATION AND DEBRIDEMENT PERIRECTAL ABSCESS;  Surgeon: Remo Lipps  Gross, MD;  Location: [facility];  Service: General;  Laterality: N/A;  Fistula repair and ablation  . INSERTION OF SUPRAPUBIC CATHETER N/A 10/10/2017   Procedure: INSERTION OF SUPRAPUBIC CATHETER;  Surgeon: Alexis Frock, MD;  Location: [facility];  Service: Urology;  Laterality: N/A;  . LAPAROSCOPIC REVISION VENTRICULAR-PERITONEAL (V-P) SHUNT N/A 06/12/2017   Procedure: LAPAROSCOPIC REVISION VENTRICULAR-PERITONEAL (V-P)  SHUNT;  Surgeon: Kinsinger, Arta Bruce, MD;  Location: [facility];  Service: General;  Laterality: N/A;  . TRANSTHORACIC ECHOCARDIOGRAM  06/02/2017   EF 65-70%/  trivial PR  . VENTRICULOPERITONEAL SHUNT N/A 06/12/2017   Procedure: SHUNT INSERTION VENTRICULAR-PERITONEAL;  Surgeon: Consuella Lose, MD;  Location: [facility];  Service: Neurosurgery;  Laterality: N/A;    There were no vitals filed for this visit.      Subjective Assessment - 10/16/17 1452    Subjective  Pt had outpatient surgery last Thurs.  to move catheter to abdomen.  No restrictions per grandma   Patient is accompained by: Family member   Pertinent History Hospitalized 5/27-06/27/17 with cryptococcal meningitis/encephalitis/neurosyphillis and VP shunt placement; AIDS, Hep B, hx of syphilis, bipolar disorder, seizure hx, anemia, urinary catheter, staphylococcus epidermidis, ventriculitis, hypotension, renal insuffiency, hx of substance abuse   Limitations AIDS, Hep B, hx of syphilis, seizure hx, s/p VP shunt, urinary catheter, cognitive deficits   Patient Stated Goals be able to take care of myself   Currently in Pain? Yes   Pain Score 8    Pain Location --  stomach   Pain Descriptors / Indicators Sore   Pain Type Surgical pain   Pain Onset In the past 7 days   Pain Frequency Constant   Aggravating Factors  nothing    Pain Relieving Factors meds       Self-care:  Folding towels in sitting with min v.c. For incr alignment due to visual-perceptual deficits.  Pt/caregiver education:  Checked goals and discussed progress.  Emphasized importance of using daily schedule/routine to help with memory.  Also recommended pt bring in schedule to review with therapist.  Recommended adding functional activities (therapy activities to work on attention/memory/orientation) as well as simple home maintenance tasks.  Recommended that pt/cargiver schedule these throughout the day with break/"free" time in between.  Recommended that pt get up  and get "ready for the day" instead of staying in pj's all day to prepare him for incr activity.  Also recommended avoiding long periods of tv watching.  Discussed benefits of day program (if pt is able to qualify) to provide structure.  Also discussed behavior issues as grandmother reports that pt only wants to eat and watch tv and is resistant to quit smoking as MD recommended.  Again, recommended structure/schedule to help engage pt more cognitively and recommended reminding pt of reasons not to smoke and appropriate substitutes (healthy crunchy food, gum, etc.).  Updated/progressed appropriate activities for home--see pt instruction.  Pt participated in conversation/education more today than in the past.  Pt/caregiver verbalized understanding of education provided.                       OT Education - 10/16/17 1713    Education Details Reviewed recommendation to add appropriate functional tasks/HEP for attention/memory to daily schedule--see pt instructions   Person(s) Educated Patient;Caregiver(s)   Methods Explanation;Handout   Comprehension Verbalized understanding          OT Short Term Goals - 10/16/17 1454      OT SHORT TERM GOAL #1  Title Pt/caregiver will be independent with initial HEP/functional tasks for home.   Baseline dependent   Time 4   Period Weeks   Status Achieved     OT SHORT TERM GOAL #2   Title Pt will perform UB dressing consistently without cueing.   Time 4   Period Weeks   Status Achieved  removing shirt min A due to long hair     OT SHORT TERM GOAL #3   Title Pt will be able to choose/retrieve appropriate clothing without verbal prompts.   Baseline not performing/set up currently   Time 4   Period Weeks   Status Achieved  able to choose between 2 options.  10/16/17:  met     OT SHORT TERM GOAL #4   Title Pt will be able to fold laundry with set-up.   Baseline dependent/not performing   Time 4   Period Weeks   Status Achieved   10/16/17:  min verbal cues for improved alignment, but is performing at home     OT [place] #5   Title Pt/caregiver will verbalize understanding of cognitive compensation strategies for ADLs/functional tasks to incr participation in ADLs.   Baseline dependent   Time 5   Period Weeks   Status Partially Met  Pt is starting to use a schedule, 10/03/17:  ongoing education, pt/grandma verbalized understanding of strategies.  10/16/17:  partially met as pt education ongoing and pt/grandmother have not fully implemented all strategies yet (continue goal)     Additional Short Term Goals   Additional Short Term Goals Yes     OT SHORT TERM GOAL #6   Title --   Baseline --           OT Long Term Goals - 10/16/17 1457      OT LONG TERM GOAL #1   Title Pt/caregiver will be independent with updated HEP/functional tasks for home.   Baseline dependent   Time 5   Period Weeks   Status Partially Met  10/16/17:  not fully met, needs review as pt/caregiver has not been able to implement fully at home yet due to pt cognitive deficits (continue goal)     OT LONG TERM GOAL #2   Title Pt will perform bathing/dressing without cueing (distant supervision).   Baseline min A, and prompts   Time 5   Period Weeks   Status Partially Met  10/16/17:  met for dressing, not met for bathing due precautions due to catheter (min A and cueing)--continue goal     OT LONG TERM GOAL #3   Title Pt will follow simple schedule/routine for ADLs with mod cues.   Baseline pt needs cueing, assistance for initiation and throughout ADLs, decr attention/ability to follow directions   Time 5   Period Weeks   Status Partially Met  10/03/17:  revised goal.  10/16/17:  max cues due to decr initiation, attention, memory (continue goal)     OT LONG TERM GOAL #4   Title Pt will perform simple snack prep with min cueing/supervision.   Baseline not performing/dependent   Time 8   Period Weeks   Status Achieved   10/03/17:  in familiar environment per grandmother report     OT LONG TERM GOAL #5   Title Pt will perform simple home maintenance task with supervision/min cueing.   Baseline not performing/dependent   Time 8   Period Weeks   Status Achieved  10/16/17: at approx this level for folding clothes, sweeping  Long Term Additional Goals   Additional Long Term Goals Yes     OT LONG TERM GOAL #6   Title Pt will perform at least 4 different home maintenance tasks consistently with supervision/min cues to initiate only.   Baseline currently sweeping inconsistently and folding clothes only   Time 5   Period Weeks   Status New               Plan - 10/16/17 1744    Clinical Impression Statement Pt is progressing towards goals with improving ADL and IADL performance.  Progress slowed due to significant cognitive deficits including decr attention, memory, and awareness.  However, pt does respond and is more cognitive engaged in sessions now.  Pt would benefit from continued occupational therapy to incr ADL/IADL participation, continue pt/caregiver education regarding how to incr ADL/IADL participation with strategies due to cognitive deficits, visual-perceptual deficits, and decr coordination.  Will actively continue to work towards STG #5 and LTGs #1,2,3, and updated/new LTG#6.   Rehab Potential Fair   Current Impairments/barriers affecting progress: cognitive deficits, urinary catheter, severity of deficits   OT Frequency 1x / week   OT Duration --  for 5 weeks   OT Treatment/Interventions Self-care/ADL training;Therapeutic exercise;DME and/or AE instruction;Therapist, nutritional;Therapeutic activities;Patient/family education;Cognitive remediation/compensation;Neuromuscular education;Visual/perceptual remediation/compensation;Therapeutic exercises   Plan review pt's daily schedule if he brings in; IADL tasks; continue with strategies for incr ADL/IADL performance   Consulted and  Agree with Plan of Care Patient;Family member/caregiver   Family Member Consulted grandmother--Sue, Mom      Patient will benefit from skilled therapeutic intervention in order to improve the following deficits and impairments:  Decreased balance, Impaired vision/preception, Decreased cognition, Decreased knowledge of precautions, Impaired perceived functional ability, Impaired UE functional use, Decreased strength, Decreased safety awareness, Decreased knowledge of use of DME, Decreased coordination, Decreased activity tolerance  Visit Diagnosis: Attention and concentration deficit - Plan: Ot plan of care cert/re-cert  Visuospatial deficit - Plan: Ot plan of care cert/re-cert  Other lack of coordination - Plan: Ot plan of care cert/re-cert  Frontal lobe and executive function deficit - Plan: Ot plan of care cert/re-cert  Muscle weakness (generalized) - Plan: Ot plan of care cert/re-cert  Unsteadiness on feet - Plan: Ot plan of care cert/re-cert    Problem List Patient Active Problem List   Diagnosis Date Noted  . Seizure disorder ([place]) 09/12/2017  . Urinary retention 07/12/2017  . Acute renal insufficiency 07/12/2017  . Acute cystitis without hematuria   . Hypotension   . 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 Acute urinary retention 06/26/2017  . Staphylococcus epidermidis ventriculitis 06/26/2017  . Bacteremia due to Streptococcus pneumoniae 06/26/2017  . Bordetella pertussis 06/26/2017  . Chronic viral hepatitis B without coma and with delta agent ([place])   . Transaminitis   . Endotracheal tube present   . Status epilepticus ([place])   . History of syphilis   . Tobacco abuse   . Meningoencephalitis   . Cryptococcal meningitis ([place])   . Seizure ([place]) 05/20/2017  . Peri-rectal abscess 03/06/2017  . 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 Anemia 09/12/2016  . 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  Bipolar disorder ([place]) 04/08/2013  . Diaper rash 10/10/2012    [facility] 10/16/2017, 7:27 PM  [facility] [address] [place], [place], [address] Phone: [phone]   Fax:  [phone]  Name: Justin Ravi  Ball MRN: [id number] Date of Birth: 12-30-1990   Vianne Bulls, OTR/L [facility] [address] [place], [place]  [address] [phone] phone [phone] 10/16/17 7:27 PM

## 2017-10-16 NOTE — Patient Instructions (Addendum)
      Avoid/remove clutter and unnecessary items from areas such as countertops/cabinets in kitchen and bathroom, closets, etc.  Organize items by purpose.  Baskets and bins help with this.   To help recall names of people or things, mentally or verbally go through the alphabet to try to determine the 1st letter of the word as this may trigger the name or word you are looking for.  If this is too difficulty and someone else knows the word, have them give you the first letter by asking, "does it start with an "A," "B," "C," etc." (or have them give you the first sound of the word or some other clue).  Review family events, occasions, names, etc.  Pictures are a good way to trigger memory.  Have others correct you if you answer something incorrectly.  Have them speak slowly with a few words to give you time to process and respond.  Don't let others automatically problem-solve.  (For example:  don't let them automatically lay out clothes in the correct position, but hand it to you folded so that you can figure it out for yourself.)  However, if you need help with tasks, they should give you as little as they can so that you can be successful.  If appropriate and safe, they may allow you to make mistakes so that you can figure out how to correct the error.  (For example, they may allow you to put your shoes on the wrong foot to see if you notice that is wrong).  If you struggle, they should give you a cue.  (Example:  "Do your shoes feel right?"  "Do they look right?")      Activities to work on memory/attention:   - Playing cards - sorting cards - dominoes - connect 4 - simple board games, copying sentences/paragraph from book - write grocery list with help  - find items at grocery store  - retrieve items in the house (start with 1 then work up to 3-5 items) - practice writing name, address, phone # - dialing phone numbers to call friends/family - add chores and activities to work on  - ask  [name] questions about tv shows

## 2017-10-16 NOTE — Addendum Note (Signed)
Addended by: Willa FraterFREEMAN, Roxanna Mcever D on: 10/16/2017 07:29 PM   Modules accepted: Orders

## 2017-10-17 LAB — T-HELPER CELL (CD4) - (RCID CLINIC ONLY)
CD4 % Helper T Cell: 8 % — ABNORMAL LOW (ref 33–55)
CD4 T CELL ABS: 120 /uL — AB (ref 400–2700)

## 2017-10-19 LAB — HIV RNA, RTPCR W/R GT (RTI, PI,INT)
HIV 1 RNA Quant: <20 copies/mL
HIV-1 RNA Quant, Log: <1.3 Log copies/mL

## 2017-10-23 ENCOUNTER — Ambulatory Visit: Payer: Medicaid Other | Admitting: Speech Pathology

## 2017-10-23 ENCOUNTER — Encounter: Payer: No Typology Code available for payment source | Admitting: Occupational Therapy

## 2017-10-23 DIAGNOSIS — R41841 Cognitive communication deficit: Secondary | ICD-10-CM

## 2017-10-23 DIAGNOSIS — R41840 Attention and concentration deficit: Secondary | ICD-10-CM | POA: Diagnosis not present

## 2017-10-23 NOTE — Patient Instructions (Addendum)
  Bring notebook, calendar and glasses to therapy  Cognitive Activities you can do at home:   - Solitaire  - Majong  - Scrabble  - Chess/Checkers  - Crosswords (easy level)  - Education officer, communityUno  - Card Games  - Board Games  - Connect 4  - Simon  - the Memory Game  - Dominoes  - Backgammon  On your computer, tablet or phone: MetallurgistMemory Match Game App Sort it out (easy) Photo Quiz  - what's the word Mix 2 Words Spot the difference games

## 2017-10-23 NOTE — Therapy (Signed)
[facility] [address] [place], [place], [address] Phone: [phone]   Fax:  [phone]  Speech Language Pathology Treatment  Patient Details  Name: Justin Ball MRN: [id number] Date of Birth: 1991/12/10 Referring Provider: Tommy Medal, Roderic Scarce, MD  Encounter Date: 10/23/2017      End of Session - 10/23/17 1508    Visit Number 9   Number of Visits 20   Authorization - Visit Number 1   Authorization - Number of Visits 12   SLP Start Time 0940   SLP Stop Time  1400   SLP Time Calculation (min) 42 min   Activity Tolerance Patient tolerated treatment well      Past Medical History:  Diagnosis Date  . 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 Anxiety   . 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 Bipolar 1 disorder ([place])   . Chronic viral hepatitis B without coma and with delta agent ([place])   . Cognitive communication deficit    working w/ therapy  . 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 Depression   . 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 Foley catheter in place   . 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 History of scabies 2013  . History of syphilis   . 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 Neurogenic bowel    wears depends  . Neuromuscular disorder ([place])   . 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 Seizure disorder [facility])    neurologist-  dr Leta Baptist ([place]  [facility])--caused by cryptococcal meningitis/ multiple infarctions--  no seizure since hospitalization May 2018  . Status epilepticus ([place])   . Visuospatial deficit     Past Surgical History:  Procedure Laterality Date  . CYSTOSCOPY N/A 10/10/2017   Procedure: CYSTOSCOPY;  Surgeon: Alexis Frock, MD;  Location: [facility];  Service: Urology;  Laterality: N/A;  . EXAMINATION UNDER ANESTHESIA N/A 12/28/2014   Procedure: EXAM UNDER ANESTHESIA;  Surgeon: Michael Boston, MD;  Location: [facility];  Service: General;  Laterality: N/A;  . FLOOR OF MOUTH BIOPSY N/A 09/16/2016   Procedure: BIOPSY OF ORAL ABCESS;  Surgeon: Leta Baptist, MD;  Location: [facility];  Service: ENT;  Laterality: N/A;  . INCISION AND DRAINAGE ABSCESS N/A 09/16/2016   Procedure: INCISION AND DRAINAGE ABSCESS;  Surgeon: Leta Baptist, MD;  Location: [facility];  Service: ENT;  Laterality: N/A;  . [place]  09/ 03/ 2011   dr toth  . INCISION AND DRAINAGE PERIRECTAL ABSCESS N/A 12/28/2014   Procedure: IRRIGATION AND DEBRIDEMENT PERIRECTAL ABSCESS;  Surgeon: Michael Boston, MD;  [facility];  Service: General;  Laterality: N/A;  Fistula repair and ablation  . INSERTION OF SUPRAPUBIC CATHETER N/A 10/10/2017   Procedure: INSERTION OF SUPRAPUBIC CATHETER;  Surgeon: Alexis Frock, MD;  Location: [facility];  Service: Urology;  Laterality: N/A;  . LAPAROSCOPIC REVISION VENTRICULAR-PERITONEAL (V-P) SHUNT N/A 06/12/2017   Procedure: LAPAROSCOPIC REVISION  VENTRICULAR-PERITONEAL (V-P) SHUNT;  Surgeon: Kinsinger, Arta Bruce, MD;  Location: [facility];  Service: General;  Laterality: N/A;  . TRANSTHORACIC ECHOCARDIOGRAM  06/02/2017   EF 65-70%/  trivial PR  . VENTRICULOPERITONEAL SHUNT N/A 06/12/2017   Procedure: SHUNT INSERTION VENTRICULAR-PERITONEAL;  Surgeon: Consuella Lose, MD;  Location: [facility];  Service: Neurosurgery;  Laterality: N/A;    There were no vitals filed for this visit.       Subjective Assessment - 10/23/17 1326    Subjective "He had surgery and missed 2 sessions"               ADULT SLP TREATMENT - 10/23/17 1326      General Information   Behavior/Cognition Alert;Cooperative;Distractible;Requires cueing     Treatment Provided   Treatment provided Cognitive-Linquistic     Cognitive-Linquistic Treatment   Treatment focused on Cognition   Skilled Treatment Pt forgot his notebook and calendar and eyeglasses - mod A for orientation of date.  Attempted written sequencing task, however pt unable to see page without his glasses. Pt attended to 2 tasks - simple mental time math and abstract reasoning with frequent verbal  and questioning cues for each task - 10 minutes per task.      Assessment / Recommendations / Plan   Plan Continue with current plan of care     Progression Toward Goals   Progression toward goals Not progressing toward goals (comment)  pt continues to forget his notebook, calendar and glasses           SLP Education - 10/23/17 1504    Education provided Yes   Education Details Imortance of coming to ST with glasses and notebook/calendar for carryover at home   Person(s) Educated Patient;Spouse   Methods Explanation;Handout;Verbal cues   Comprehension Verbalized understanding          SLP Short Term Goals - 10/23/17 1507      SLP SHORT TERM GOAL #1   Title pt will demo sustained attention for 5 minutes, in 3 different tasks completed 90% success, in 3 sessions   Time 1   Period Weeks   Status Not Met     SLP SHORT TERM GOAL #2   Title pt will tell SLP 2 cognitive defict areas with min A occasionallly, in three sessions   Time 1   Period Weeks   Status Not Met     SLP SHORT TERM GOAL #3   Title pt will demo orientation to year, month, date with modified independence (calendar)   Time 1   Period Weeks   Status Not Met     SLP SHORT TERM GOAL #4   Title pt will demo sustained attention to one task for 10 minutes  to achieve 90% success   Time 1   Period Weeks   Status Not Met          SLP Long Term Goals - 10/23/17 1507      SLP LONG TERM GOAL #1   Title pt will demonstrate sustained attention to one task for 15 mintues in order to achieve 90% success with rare nonverbal cues back to task, in two sessions   Time 4   Period Weeks  or 20 visits   Status On-going     SLP LONG TERM GOAL #2   Title pt will demo emergent awareness in a simple paper/pencil cognitive linguistic task 60% of the time given pt double checking responses   Time 4   Period Weeks  or  20 visits   Status On-going     SLP LONG TERM GOAL #3   Title pt will demo orientation to month, day, date with modified independence over 8 sessions   Time 4   Period Weeks  or 20 visits   Status On-going          Plan - 10/23/17 1505    Clinical Impression Statement Pt continues to require mod A for simple problem solving, organization, memory and orientation. Continue skilled ST to maximize cognitive lingusitic skills for improved safety and to reduce burden of care.    Speech Therapy Frequency 2x / week   Duration --  12 visits through 11/07/17 per medicaid   Treatment/Interventions Language facilitation;Compensatory techniques;Internal/external aids;SLP instruction and feedback;Cognitive reorganization;Environmental controls;Multimodal communcation approach;Functional tasks;Cueing hierarchy;Patient/family education   Potential to Achieve Goals Fair   Potential Considerations Severity of impairments;Cooperation/participation level;Family/community support;Ability to learn/carryover information   Consulted and Agree with Plan of Care Patient;Family member/caregiver   Family Member Consulted grandmother Collie Siad      Patient will benefit from skilled therapeutic intervention in order to improve the following deficits and impairments:   Cognitive communication deficit    Problem List Patient Active Problem List   Diagnosis  Date Noted  . Seizure disorder ([employer]) 09/12/2017  . Urinary retention 07/12/2017  . Acute renal insufficiency 07/12/2017  . Acute cystitis without hematuria   . Hypotension   . 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 Acute urinary retention 06/26/2017  . Staphylococcus epidermidis ventriculitis 06/26/2017  . Bacteremia due to Streptococcus pneumoniae 06/26/2017  . Bordetella pertussis 06/26/2017  . Chronic viral hepatitis B without coma and with delta agent ([place])   . Transaminitis   . Endotracheal tube present   . Status epilepticus ([place])   . History of syphilis   . Tobacco abuse   . Meningoencephalitis   . Cryptococcal meningitis ([place])   . Seizure ([place]) 05/20/2017  . Peri-rectal abscess 03/06/2017  . 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 Anemia 09/12/2016  . 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 Bipolar disorder ([place]) 04/08/2013  . Diaper rash 10/10/2012    Xyla Leisner, Annye Rusk MS, CCC-SLP 10/23/2017, 3:10 PM  [facility] [address] [place], [place], [address] Phone: [phone]   Fax:  [phone]   Name: Justin Ball MRN: [id number] Date of Birth: 05/04/1991

## 2017-10-24 ENCOUNTER — Ambulatory Visit (INDEPENDENT_AMBULATORY_CARE_PROVIDER_SITE_OTHER): Payer: Medicaid Other | Admitting: Infectious Disease

## 2017-10-24 ENCOUNTER — Encounter: Payer: Self-pay | Admitting: Infectious Disease

## 2017-10-24 VITALS — BP 107/66 | HR 92 | Temp 98.2°F | Wt 165.0 lb

## 2017-10-24 DIAGNOSIS — B20 Human immunodeficiency virus [HIV] disease: Secondary | ICD-10-CM | POA: Diagnosis not present

## 2017-10-24 DIAGNOSIS — G0490 Encephalitis and encephalomyelitis, unspecified: Secondary | ICD-10-CM

## 2017-10-24 DIAGNOSIS — B451 Cerebral cryptococcosis: Secondary | ICD-10-CM

## 2017-10-24 DIAGNOSIS — Z982 Presence of cerebrospinal fluid drainage device: Secondary | ICD-10-CM

## 2017-10-24 DIAGNOSIS — G40909 Epilepsy, unspecified, not intractable, without status epilepticus: Secondary | ICD-10-CM

## 2017-10-24 DIAGNOSIS — B180 Chronic viral hepatitis B with delta-agent: Secondary | ICD-10-CM | POA: Diagnosis not present

## 2017-10-24 NOTE — Patient Instructions (Signed)
I would like you to come back in the next 2 weeks to see ID pharmacy [facility]([place] or [place]) and then me in a month  BRING YOUR MEDS WITH YOU  GREAT JOB TAKING YOUR ODEFSEY AND TIVICAY WITH FOOD!!!

## 2017-10-24 NOTE — Progress Notes (Signed)
Subjective:   Chief complaint: Follow-up for HIV disease AIDS and cryptococcal meningitis   Patient ID: Justin Ball, male    DOB: 09/22/1991, 26 y.o.   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 He has been on fluconazole but I learned at last visit from [facility] pharmacy that he had not picked up rx for fluconazole since July because his mother did not "ask for this" vs the specialty items which pharmacy prompts pt to fill.  He had been without headaches or other symptoms to suggest recurrence. He was having about 5 bm per day which is causing pain in his rectal area.  He is on ODEFSEY and Tivicay which he is taking with meals.   Unfortunately though he had not initially been adherent to these medications as we had hoped and his viral load actually rose to 6970.  Genotyping showed no evidence of emerging resistance consistent with someone not being compliant with her therapy as directed.  Since that visit Molli Poseymbre has visited with him and his mother and aunt and emphasized need to take aRV every day with breakfast. They DID not bring meds with them which makes things difficult since it is not clear to me that he is taking everything that is being rx.   He is now staying with his Grand-mother almost EVERY day and SHE is ensuring that he takes his medications with food and NOW he is with VL <20 and CD4 has risen.  Apparently all of the funds are going to his mother despite the fact that his Grandmother is providing nearly all the care. She is seeking to have Gustavo placed in a Daycare program during the day as well. I would like them to meet with THP.    Lab Results  Component Value Date   HIV1RNAQUANT <20 DETECTED 10/15/2017   HIV1RNAQUANT 6,970 (H) 09/13/2017   HIV1RNAQUANT 66 (H)  08/02/2017   Lab Results  Component Value Date   CD4TABS 120 (L) 10/15/2017   CD4TABS 110 (L) 09/13/2017   CD4TABS 50 (L) 08/02/2017   Past Medical History:  Diagnosis Date  . 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 Anxiety   . 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 Bipolar 1 disorder (HCC)   . 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 Cognitive communication deficit    working w/ therapy  . 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 Depression   . 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 Foley catheter in place   . 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 History of scabies 2013  . History of syphilis   . 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 Neurogenic bowel    wears depends  . Neuromuscular disorder (HCC)   .  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 Seizure disorder [facility])    neurologist-  dr Marjory Lies ([facility] [facility])--caused by cryptococcal meningitis/ multiple infarctions--  no seizure since hospitalization May 2018  . Status epilepticus (HCC)   . Visuospatial deficit     Past Surgical History:  Procedure Laterality Date  . CYSTOSCOPY N/A 10/10/2017   Procedure: CYSTOSCOPY;  Surgeon: Sebastian Ache, MD;  Location: [facility];  Service: Urology;  Laterality: N/A;  . EXAMINATION UNDER ANESTHESIA N/A 12/28/2014   Procedure: EXAM UNDER ANESTHESIA;  Surgeon: Karie Soda, MD;  [facility];  Service: General;  Laterality: N/A;  . FLOOR OF MOUTH BIOPSY N/A 09/16/2016   Procedure: BIOPSY OF ORAL ABCESS;  Surgeon: Newman Pies, MD;  [facility];  Service: ENT;  Laterality: N/A;  . INCISION AND DRAINAGE ABSCESS N/A 09/16/2016   Procedure: INCISION AND DRAINAGE ABSCESS;  Surgeon: Newman Pies, MD;  [facility];  Service: ENT;  Laterality: N/A;  . INCISION AND DRAINAGE PERIRECTAL ABSCESS  09/ 03/ 2011   dr toth  . INCISION AND DRAINAGE PERIRECTAL ABSCESS N/A 12/28/2014   Procedure: IRRIGATION AND DEBRIDEMENT PERIRECTAL ABSCESS;  Surgeon: Karie Soda, MD;  [facility];  Service: General;  Laterality: N/A;  Fistula repair and ablation  . INSERTION OF SUPRAPUBIC CATHETER N/A 10/10/2017   Procedure: INSERTION OF SUPRAPUBIC CATHETER;  Surgeon: Sebastian Ache, MD;  [facility];  Service: Urology;  Laterality: N/A;  . LAPAROSCOPIC REVISION VENTRICULAR-PERITONEAL (V-P) SHUNT N/A 06/12/2017   Procedure: LAPAROSCOPIC REVISION VENTRICULAR-PERITONEAL (V-P) SHUNT;  Surgeon: Kinsinger, De Blanch, MD;  [facility];  Service: General;  Laterality: N/A;  . TRANSTHORACIC ECHOCARDIOGRAM  06/02/2017   EF 65-70%/  trivial PR  . VENTRICULOPERITONEAL SHUNT N/A 06/12/2017   Procedure: SHUNT INSERTION VENTRICULAR-PERITONEAL;  Surgeon: [name], MD;  [facility];  Service: Neurosurgery;  Laterality: N/A;    Family History  Problem Relation Age of Onset  . Hypertension Other       Social History   Social History  . Marital status: Single    Spouse name: N/A  . Number of children: N/A  . Years of education: N/A   Social History Main Topics  . Smoking status: Current Every Day Smoker    Packs/day: 0.50    Years: 5.00    Types: Cigarettes  . Smokeless tobacco: Never Used     Comment: cutting back  . Alcohol use No  . Drug use: No  . Sexual activity: Yes    Partners: Male    Birth control/ protection: Condom     Comment: irregular condom use;  educated   Other Topics Concern  . Not on file   Social History Narrative  . No narrative on file    No Known Allergies   Current Outpatient Prescriptions:  .  acetaminophen (TYLENOL) 325 MG tablet, Take 2 tablets (650 mg total) by mouth every 4 (four) hours as needed for mild pain (temp > 101.5)., Disp: , Rfl:  .  dolutegravir (TIVICAY) 50 MG tablet, Take 1 tablet (50 mg total) by mouth daily. (Patient taking differently: Take 50 mg by mouth every morning. ), Disp: 30 tablet, Rfl: 6 .  emtricitabine-rilpivir-tenofovir AF (ODEFSEY) 200-25-25 MG TABS tablet, Take 1 tablet by mouth daily with breakfast. (Patient taking differently: Take 1 tablet by mouth daily with breakfast. ), Disp: 30 tablet, Rfl: 6 .  fluconazole (DIFLUCAN) 200 MG tablet, Take 2 tablets (400 mg total) by mouth daily., Disp: 60  tablet, Rfl: 3 .  levETIRAcetam (KEPPRA) 1000 MG tablet, Take 1 tablet (1,000 mg total) by mouth 2 (two) times daily. (Patient taking differently: Take 1,000 mg by mouth 2 (two) times daily. ), Disp: 60 tablet, Rfl: 5 .  QUEtiapine (SEROQUEL) 25 MG tablet, Take 1 tablet (25 mg total) by mouth 2 (two) times daily as needed (agitation). (Patient taking differently: Take 25 mg by mouth 2 (two) times daily as needed (agitation). ), Disp: 60 tablet, Rfl: 4 .  Skin Protectants, Misc. (PERIGUARD) OINT, Apply 1 application topically as needed., Disp: 3 Tube, Rfl: 2 .  sulfamethoxazole-trimethoprim (BACTRIM DS,SEPTRA DS) 800-160 MG tablet, Take 1 tablet by mouth 3 (three) times a week., Disp: 12 tablet, Rfl: 6 .  traMADol (ULTRAM) 50 MG tablet, Take 1-2 tablets (50-100 mg total) by mouth every 6 (six) hours as needed for moderate pain or severe pain. Post-operatively., Disp: 15 tablet, Rfl: 0   Review of Systems  Unable to perform ROS: Dementia       Objective:   Physical Exam  Constitutional: No distress.  HENT:  Head: Normocephalic.  Mouth/Throat: Oropharynx is clear and moist. No oropharyngeal  exudate.  Eyes: EOM are normal. No scleral icterus.  Neck: Normal range of motion. No JVD present. No tracheal deviation present.  Cardiovascular: Normal rate and regular rhythm.   Pulmonary/Chest: Effort normal. No respiratory distress. He has no wheezes.  Abdominal: Soft. Bowel sounds are normal. He exhibits no distension.  Genitourinary:     Musculoskeletal: Normal range of motion. He exhibits no edema or deformity.  Neurological: He is alert. He has normal strength. No cranial nerve deficit or sensory deficit. Gait normal.  Oriented to person and place  Skin: No rash noted. He is not diaphoretic. No erythema.  Psychiatric: His speech is delayed. He is slowed. Cognition and memory are impaired.        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 This is still being worked out.  He needs help from case management and should likely have Grand mother made his guardian  I spent greater than 25 minutes with the patient including greater than 50% of time in face to face counsel of the patient and her Grandmother re his HIV/AIDS, how to take his regimen, his cryptococcal meningitis, his disability  and in coordination of his care.

## 2017-10-25 ENCOUNTER — Ambulatory Visit: Payer: Medicaid Other | Attending: Internal Medicine | Admitting: Speech Pathology

## 2017-10-25 ENCOUNTER — Encounter: Payer: No Typology Code available for payment source | Admitting: Occupational Therapy

## 2017-10-25 DIAGNOSIS — R41841 Cognitive communication deficit: Secondary | ICD-10-CM | POA: Insufficient documentation

## 2017-10-25 DIAGNOSIS — R41842 Visuospatial deficit: Secondary | ICD-10-CM | POA: Insufficient documentation

## 2017-10-25 DIAGNOSIS — R41844 Frontal lobe and executive function deficit: Secondary | ICD-10-CM | POA: Insufficient documentation

## 2017-10-25 DIAGNOSIS — R2681 Unsteadiness on feet: Secondary | ICD-10-CM | POA: Insufficient documentation

## 2017-10-25 DIAGNOSIS — R41840 Attention and concentration deficit: Secondary | ICD-10-CM | POA: Insufficient documentation

## 2017-10-25 DIAGNOSIS — M6281 Muscle weakness (generalized): Secondary | ICD-10-CM | POA: Insufficient documentation

## 2017-10-25 DIAGNOSIS — R278 Other lack of coordination: Secondary | ICD-10-CM | POA: Insufficient documentation

## 2017-10-25 DIAGNOSIS — R2689 Other abnormalities of gait and mobility: Secondary | ICD-10-CM | POA: Insufficient documentation

## 2017-10-25 NOTE — Therapy (Signed)
[facility] [address] [place], [place], [address] Phone: [phone]   Fax:  [phone]  Speech Language Pathology Treatment  Patient Details  Name: Justin Ball MRN: [id number] Date of Birth: Nov 28, 1991 Referring Provider: Tommy Medal, Roderic Scarce, MD  Encounter Date: 10/25/2017      End of Session - 10/25/17 1614    Visit Number 10   Number of Visits 20   Date for SLP Re-Evaluation 10/19/17   Authorization Type [employer]ET[employer] - 25 visits combined - HARD LIMIT   Authorization Time Period medicaid approved 12 visits 09/27/17 to 11/07/18   Authorization - Visit Number 2   Authorization - Number of Visits 12   SLP Start Time 7121   SLP Stop Time  9758   SLP Time Calculation (min) 52 min      Past Medical History:  Diagnosis Date  . 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 Anxiety   . 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 Bipolar 1 disorder ([place])   . Chronic viral hepatitis B without coma and with delta agent ([place])   . Cognitive communication deficit    working w/ therapy  . 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 Depression   . 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 Foley catheter in place   . 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 History of scabies 2013  . History of syphilis   . 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 Neurogenic bowel    wears depends  .  Neuromuscular disorder ([place])   . 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 Seizure disorder [facility])    neurologist-  dr Leta Baptist ([place] [facility])--caused by cryptococcal meningitis/ multiple infarctions--  no seizure since hospitalization May 2018  . Status epilepticus ([place])   . Visuospatial deficit     Past Surgical History:  Procedure Laterality Date  . CYSTOSCOPY N/A 10/10/2017   Procedure: CYSTOSCOPY;  Surgeon: Alexis Frock, MD;  Location: [facility];  Service: Urology;  Laterality: N/A;  . EXAMINATION UNDER ANESTHESIA N/A 12/28/2014   Procedure: EXAM UNDER ANESTHESIA;  Surgeon: Michael Boston, MD;  Location: [facility];  Service: General;  Laterality: N/A;  . FLOOR OF MOUTH BIOPSY N/A 09/16/2016   Procedure: BIOPSY OF ORAL ABCESS;  Surgeon: Leta Baptist, MD;  Location: [facility];  Service: ENT;  Laterality: N/A;  . INCISION AND DRAINAGE ABSCESS N/A 09/16/2016   Procedure: INCISION AND DRAINAGE ABSCESS;  Surgeon: Leta Baptist, MD;  Location: [facility];  Service: ENT;  Laterality: N/A;  . [place]  09/ 03/ 2011   dr toth  . INCISION AND DRAINAGE PERIRECTAL ABSCESS N/A 12/28/2014   Procedure: IRRIGATION AND DEBRIDEMENT PERIRECTAL ABSCESS;  Surgeon: Michael Boston, MD;  Location: [facility];  Service: General;  Laterality: N/A;  Fistula repair and ablation  . INSERTION OF SUPRAPUBIC CATHETER N/A 10/10/2017   Procedure: INSERTION OF SUPRAPUBIC CATHETER;  Surgeon: Alexis Frock, MD;  Location: [facility]  [place];  Service: Urology;  Laterality: N/A;  . LAPAROSCOPIC REVISION VENTRICULAR-PERITONEAL (V-P) SHUNT N/A 06/12/2017   Procedure: LAPAROSCOPIC REVISION VENTRICULAR-PERITONEAL (V-P) SHUNT;  Surgeon: Kinsinger, Arta Bruce, MD;  Location: [facility];  Service: General;  Laterality: N/A;  . TRANSTHORACIC ECHOCARDIOGRAM  06/02/2017   EF 65-70%/  trivial PR  . VENTRICULOPERITONEAL SHUNT N/A 06/12/2017   Procedure: SHUNT INSERTION VENTRICULAR-PERITONEAL;  Surgeon: Consuella Lose,  MD;  Location: [facility];  Service: Neurosurgery;  Laterality: N/A;    There were no vitals filed for this visit.      Subjective Assessment - 10/25/17 1608    Subjective "He did the homework but wanted more help than I would give him"   Patient is accompained by: Family member   Special Tests Collie Siad   Currently in Pain? No/denies               ADULT SLP TREATMENT - 10/25/17 1608      General Information   Behavior/Cognition Alert;Cooperative;Distractible;Requires cueing     Treatment Provided   Treatment provided Cognitive-Linquistic     Cognitive-Linquistic Treatment   Treatment focused on Cognition   Skilled Treatment Pt attended to 3 different cognitive tasks (functional time telling, card sort, pen and  paper sequencing tasks) for 12 minutes each with occasional min A for attention to each task He required max A for time telling and simple time problem solving. Usual mod A for sequencing simple ADL's on paper     Assessment / Recommendations / Plan   Plan Continue with current plan of care     Progression Toward Goals   Progression toward goals Not progressing toward goals (comment)  no notebook or calendar            SLP Short Term Goals - 10/25/17 1614      SLP SHORT TERM GOAL #1   Title pt will demo sustained attention for 5 minutes, in 3 different tasks completed 90% success, in 3 sessions   Time 1   Period Weeks   Status Not Met     SLP SHORT TERM GOAL #2   Title pt will tell SLP 2 cognitive defict areas with min A occasionallly, in three sessions   Time 1   Period Weeks   Status Not Met     SLP SHORT TERM GOAL #3   Title pt will demo orientation to year, month, date with modified independence (calendar)   Time 1   Period Weeks   Status Not Met     SLP SHORT TERM GOAL #4   Title pt will demo sustained attention to one task for 10 minutes to achieve 90% success   Time 1   Period Weeks   Status Not Met          SLP Long Term Goals -  10/25/17 1614      SLP LONG TERM GOAL #1   Title pt will demonstrate sustained attention to one task for 15 mintues in order to achieve 90% success with rare nonverbal cues back to task, in two sessions   Time 4   Period Weeks  or 20 visits   Status On-going     SLP LONG TERM GOAL #2   Title pt will demo emergent awareness in a simple paper/pencil cognitive linguistic task 60% of the time given pt double checking responses   Time 4   Period Weeks  or 20 visits   Status On-going     SLP LONG TERM GOAL #  3   Title pt will demo orientation to month, day, date with modified independence over 8 sessions   Time 4   Period Weeks  or 20 visits   Status On-going          Plan - 10/25/17 1613    Clinical Impression Statement Pt continues to require mod A for simple problem solving, organization, memory and orientation. Continue skilled ST to maximize cognitive lingusitic skills for improved safety and to reduce burden of care.    Speech Therapy Frequency 2x / week   Treatment/Interventions Language facilitation;Compensatory techniques;Internal/external aids;SLP instruction and feedback;Cognitive reorganization;Environmental controls;Multimodal communcation approach;Functional tasks;Cueing hierarchy;Patient/family education   Potential to Achieve Goals Fair   Potential Considerations Severity of impairments;Cooperation/participation level;Family/community support;Ability to learn/carryover information   SLP Home Exercise Plan home tasks provided today   Consulted and Agree with Plan of Care Patient;Family member/caregiver   Family Member Consulted grandmother Collie Siad      Patient will benefit from skilled therapeutic intervention in order to improve the following deficits and impairments:   Cognitive communication deficit    Problem List Patient Active Problem List   Diagnosis Date Noted  . Seizure disorder ([place]) 09/12/2017  . Urinary retention 07/12/2017  . Acute renal insufficiency  07/12/2017  . Acute cystitis without hematuria   . Hypotension   . 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 Acute urinary retention 06/26/2017  . Staphylococcus epidermidis ventriculitis 06/26/2017  . Bacteremia due to Streptococcus pneumoniae 06/26/2017  . Bordetella pertussis 06/26/2017  . Chronic viral hepatitis B without coma and with delta agent ([place])   . Transaminitis   . Endotracheal tube present   . Status epilepticus ([place])   . History of syphilis   . Tobacco abuse   . Meningoencephalitis   . Cryptococcal meningitis ([place])   . Seizure ([place]) 05/20/2017  . Peri-rectal abscess 03/06/2017  . 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 Anemia 09/12/2016  . 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 Bipolar disorder ([place]) 04/08/2013  . Diaper rash 10/10/2012    Charlane Westry, Annye Rusk MS, CCC-SLP 10/25/2017, 4:15 PM  [facility] [address] [place], [place], [address] Phone: [phone]   Fax:  [phone]   Name: Justin Ball MRN: [id number] Date of Birth: 03/21/1991

## 2017-10-29 ENCOUNTER — Ambulatory Visit: Payer: Medicaid Other | Admitting: Occupational Therapy

## 2017-10-29 ENCOUNTER — Encounter: Payer: Self-pay | Admitting: Speech Pathology

## 2017-10-29 ENCOUNTER — Ambulatory Visit: Payer: Medicaid Other | Admitting: Speech Pathology

## 2017-10-29 DIAGNOSIS — R41841 Cognitive communication deficit: Secondary | ICD-10-CM | POA: Diagnosis not present

## 2017-10-29 NOTE — Therapy (Signed)
[facility] [address] [place], [place], [address] Phone: [phone]   Fax:  [phone]  Speech Language Pathology Treatment  Patient Details  Name: Justin Ball MRN: [id number] Date of Birth: 08/15/1991 Referring Provider: Tommy Medal, Roderic Scarce, MD   Encounter Date: 10/29/2017  End of Session - 10/29/17 1422    Visit Number  11    Number of Visits  20    Date for SLP Re-Evaluation  10/19/17    Authorization Type  [employer]ET[employer] - 25 visits combined - HARD LIMIT    Authorization Time Period  medicaid approved 12 visits 09/27/17 to 11/07/18    Authorization - Visit Number  3    Authorization - Number of Visits  12    SLP Start Time  1232    SLP Stop Time   8250    SLP Time Calculation (min)  43 min    Activity Tolerance  Patient tolerated treatment well       Past Medical History:  Diagnosis Date  . 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 Anxiety   . 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 Bipolar 1 disorder ([place])   . Chronic viral hepatitis B without coma and with delta agent ([place])   . Cognitive communication deficit    working w/ therapy  . 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 Depression   . 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 Foley catheter in place   . 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 History of scabies 2013  . History of syphilis   . 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 Neurogenic bladder    secondary to  damage from brain infection  . Neurogenic bowel    wears depends  . Neuromuscular disorder ([place])   . 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 Seizure disorder [facility])    neurologist-  dr Leta Baptist ([place] [facility])--caused by cryptococcal meningitis/ multiple infarctions--  no seizure since hospitalization May 2018  . Status epilepticus ([place])   . Visuospatial deficit     Past Surgical History:  Procedure Laterality Date  . [place] ABSCESS  09/ 03/ 2011   dr toth  . TRANSTHORACIC ECHOCARDIOGRAM  06/02/2017   EF 65-70%/  trivial PR    There were no vitals filed for this visit.  Subjective Assessment - 10/29/17 1236    Subjective  "He forgot his homework"      Patient is accompained by:  Family member    Special Tests  Justin Ball    Currently in Pain?  No/denies            ADULT SLP TREATMENT - 10/29/17 1238      General Information   Behavior/Cognition  Alert;Cooperative;Distractible;Requires cueing      Treatment Provided   Treatment provided  Cognitive-Linquistic      Cognitive-Linquistic Treatment   Treatment focused on  Cognition    Skilled Treatment  Simple attention/problem solving counting money (coins) with usual min A to attend to type of coin needed vs  the type of coing he picked up. Max A for emergent awareness on this task. Functional simple time problem solving with  visual cues required occasional min A for attention to details and for working memory to hold the problem in his head. Pt required frequent mod A to utilize phone to be oriented to year and month.       Assessment / Recommendations / Plan   Plan  Continue with current plan of care      Progression Toward Goals   Progression toward goals  Not progressing toward goals (comment) forgot homework   forgot homework        SLP Short Term Goals - 10/29/17 1422      SLP SHORT TERM GOAL #1   Title  pt will demo sustained attention for 5 minutes, in 3 different tasks completed 90% success,  in 3 sessions    Time  1    Period  Weeks    Status  Not Met      SLP SHORT TERM GOAL #2   Title  pt will tell SLP 2 cognitive defict areas with min A occasionallly, in three sessions    Time  1    Period  Weeks    Status  Not Met      SLP SHORT TERM GOAL #3   Title  pt will demo orientation to year, month, date with modified independence (calendar)    Time  1    Period  Weeks    Status  Not Met      SLP SHORT TERM GOAL #4   Title  pt will demo sustained attention to one task for 10 minutes to achieve 90% success    Time  1    Period  Weeks    Status  Not Met       SLP Long Term Goals - 10/29/17 1422      SLP LONG TERM GOAL #1   Title  pt will demonstrate sustained attention to one task for 15 mintues in order to achieve 90% success with rare nonverbal cues back to task, in two sessions    Time  3    Period  Weeks or 20 visits   or 20 visits   Status  On-going      SLP LONG TERM GOAL #2   Title  pt will demo emergent awareness in a simple paper/pencil cognitive linguistic task 60% of the time given pt double checking responses    Time  3    Period  Weeks or 20 visits   or 20 visits   Status  On-going      SLP LONG TERM GOAL #3   Title  pt will demo orientation to month, day, date with modified independence over 8 sessions    Time  3    Period  Weeks or 20 visits   or 20 visits   Status  On-going       Plan - 10/29/17 1421    Clinical Impression Statement  Pt continues to require mod A for simple problem solving, organization, memory and orientation. Continue skilled ST to maximize cognitive lingusitic skills for improved safety and to reduce burden of care.     Speech Therapy Frequency  2x / week    Treatment/Interventions  Language facilitation;Compensatory techniques;Internal/external aids;SLP instruction and feedback;Cognitive reorganization;Environmental controls;Multimodal communcation approach;Functional tasks;Cueing hierarchy;Patient/family education     Potential Considerations  Severity of impairments;Cooperation/participation level;Family/community support;Ability to learn/carryover information    SLP  Home Exercise Plan  home tasks provided today    Consulted and Agree with Plan of Care  Patient;Family member/caregiver    Family Member Consulted  grandmother Justin Ball       Patient will benefit from skilled therapeutic intervention in order to improve the following deficits and impairments:   Cognitive communication deficit    Problem List Patient Active Problem List   Diagnosis Date Noted  . Seizure disorder ([place]) 09/12/2017  . Urinary retention 07/12/2017  . Acute renal insufficiency 07/12/2017  . Acute cystitis without hematuria   . Hypotension   . 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 Acute urinary retention 06/26/2017  . Staphylococcus epidermidis ventriculitis 06/26/2017  . Bacteremia due to Streptococcus pneumoniae 06/26/2017  . Bordetella pertussis 06/26/2017  . Chronic viral hepatitis B without coma and with delta agent ([place])   . Transaminitis   . Endotracheal tube present   . Status epilepticus ([place])   . History of syphilis   . Tobacco abuse   . Meningoencephalitis   . Cryptococcal meningitis ([place])   . Seizure ([place]) 05/20/2017  . Peri-rectal abscess 03/06/2017  . 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 Anemia 09/12/2016  . 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 Bipolar disorder ([place]) 04/08/2013  . Diaper rash 10/10/2012    Vick Filter, Annye Rusk MS, CCC-SLP 10/29/2017, 2:23 PM  [facility] [address] [place], [place], [address] Phone: [phone]   Fax:  [phone]   Name: Justin Ball MRN: [id number] Date of Birth: 1991-05-14

## 2017-10-31 ENCOUNTER — Encounter: Payer: Self-pay | Admitting: Occupational Therapy

## 2017-10-31 ENCOUNTER — Ambulatory Visit: Payer: Medicaid Other | Admitting: Occupational Therapy

## 2017-10-31 DIAGNOSIS — R41840 Attention and concentration deficit: Secondary | ICD-10-CM

## 2017-10-31 DIAGNOSIS — R41844 Frontal lobe and executive function deficit: Secondary | ICD-10-CM

## 2017-10-31 DIAGNOSIS — R278 Other lack of coordination: Secondary | ICD-10-CM

## 2017-10-31 DIAGNOSIS — R41842 Visuospatial deficit: Secondary | ICD-10-CM

## 2017-10-31 DIAGNOSIS — R41841 Cognitive communication deficit: Secondary | ICD-10-CM | POA: Diagnosis not present

## 2017-10-31 NOTE — Therapy (Signed)
[facility] [address] [place], [place], [address] Phone: [phone]   Fax:  [phone]  Occupational Therapy Treatment  Patient Details  Name: Justin Ball MRN: [id number] Date of Birth: [birth year] Referring Provider: Dr. Rhina Brackett Dam   Encounter Date: 10/31/2017  OT End of Session - 10/31/17 1409    Visit Number  12    Number of Visits  16 11+5=16   11+5=16   Date for OT Re-Evaluation  12/02/17    Authorization Type  [employer]--25 visits combined hard limit; **Medicaid now current    Authorization Time Period  [employer]--25 visits combined (PT used 3+OT used 11).  3 initial visits approved by MCD through 10/27/17.  Medicaid approved 5 OT visits 11/5-12/9/18    Authorization - Visit Number  1 Medicaid visits   Medicaid visits   Authorization - Number of Visits  5    OT Start Time  3790 pt arrived late   pt arrived late   OT Stop Time  1450    OT Time Calculation (min)  43 min    Activity Tolerance  Patient tolerated treatment well    Behavior During Therapy  WFL for tasks assessed/performed       Past Medical History:  Diagnosis Date  . 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 Anxiety   . 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 Bipolar 1 disorder ([place])   . Chronic viral hepatitis B without coma and with delta agent ([place])   . Cognitive communication deficit    working w/ therapy  . 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 Depression   . 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 Foley catheter in place   . 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 History of scabies 2013  . History of syphilis   . 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 Neurogenic bowel    wears depends  . Neuromuscular disorder ([place])   . 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 Seizure disorder [facility])    neurologist-  dr Leta Baptist ([facility])--caused by cryptococcal meningitis/ multiple infarctions--  no seizure since hospitalization May 2018  . Status epilepticus ([place])   . Visuospatial deficit     Past Surgical History:  Procedure Laterality Date  . [place] ABSCESS  09/ 03/ 2011   dr toth  . TRANSTHORACIC ECHOCARDIOGRAM  06/02/2017   EF 65-70%/  trivial PR    There were no vitals filed for this visit.  Subjective Assessment - 10/31/17 1408    Subjective   "I got the glasses"  (reading glasses from [employer]).      Patient is accompained by:  Family member grandmother present for last part of session only   grandmother present for last part of session only   Pertinent History  Hospitalized 5/27-06/27/17 with cryptococcal meningitis/encephalitis/neurosyphillis and VP shunt placement; AIDS, Hep B, hx of syphilis, bipolar disorder, seizure hx, anemia, urinary catheter, staphylococcus epidermidis,  ventriculitis, hypotension, renal insuffiency, hx of substance abuse, suprapubic cathether    Limitations  AIDS, Hep B, hx of syphilis, seizure hx, s/p VP shunt, suprapubic catheter, cognitive deficits    Patient Stated Goals  be able to take care of myself    Currently in Pain?  No/denies    Pain Onset  In the past 7 days       Pt reports that he has not stared day program or had aide to come in yet. Pt reports that he's been sweeping, mopping, and doing laundry at home.  Pt able to verbalize/sequence of steps for mopping with min cueing.   Completing  12-piece puzzle for attention, visual-perceptual skills, and problem-solving.  Pt needed min-mod cueing for problem-solving and continue task.  Pt did well for first 20mn with less cueing, but demo more difficulty after the first 5 min due to attention.   Discussed purpose of activity to incr awareness.  Pt needed mod cues/prompts (then able to identify attention only)  Matching clock faces to digital times (limited choices) with mod cueing to tell time and min-mod prompts/cues for visual scanning to locate digital times for environmental scanning, attention, and orientation in prep for schedule use.   Grandmother reports that pt's financial assistance is going to his mother, which is limiting his transition to grandmother's house.  She also reports that mother does not think that pt needs 24 hr supervision.  OT strongly emphasized need for 24 hr supervision due to cognitive deficits.  Grandmother agrees and reports that pt's viral load has improved with her assisting with medications.  Discussed/recommended that pt would benefit from structure/schedule of day program if possible and need to follow daily schedule.  Reviewed recommendation for short periods of structured activity (chores, therapy activities, ADLs) throughout the day vs. In a block due to attention deficits and that schedule with help with orientation and memory.  Also recommended, that they review time throughout the day functionally with clock ("What time is it?"  "What should you do now") and then relate to schedule.  Grandmother reports that pt is not getting up until late.  Reviewed recommendation to get up and get dressed, eat, take meds so that he is ready for activity vs. Watching tv.  Also recommended pt slowly begin to go to bed earlier so that he can get up earlier in prep for day program (try going to bed 15-30 min earlier for 4 days, then 15-30 min earlier for 4 days, etc).  Pt/grandmother verbalized understanding.    [facility]  Assessment - 10/31/17 0001      Precautions   Precautions  Other (comment) no driving, AIDS, Hep B, hx of syphilis, suprapubic catheter   no driving, AIDS, Hep B, hx of syphilis, suprapubic catheter   Precaution Comments  VP shunt                         OT Short Term Goals - 10/16/17 1454      OT SHORT TERM GOAL #1   Title  Pt/caregiver will be independent with initial HEP/functional tasks for home.    Baseline  dependent    Time  4    Period  Weeks    Status  Achieved      OT SHORT TERM GOAL #2   Title  Pt will perform UB dressing consistently without cueing.    Time  4    Period  Weeks    Status  Achieved removing shirt min A due to long hair   removing shirt min A due to long hair     OT SHORT TERM GOAL #3   Title  Pt will be able to choose/retrieve appropriate clothing without verbal prompts.    Baseline  not performing/set up currently    Time  4    Period  Weeks    Status  Achieved able to choose between 2 options.  10/16/17:  met   able to choose between 2 options.  10/16/17:  met     OT SHORT TERM GOAL #4   Title  Pt will be able to fold laundry with set-up.    Baseline  dependent/not performing    Time  4    Period  Weeks    Status  Achieved 10/16/17:  min verbal cues for improved alignment, but is performing at home   10/16/17:  min verbal cues for improved alignment, but is performing at home     OT [place] #5   Title  Pt/caregiver will verbalize understanding of cognitive compensation strategies for ADLs/functional tasks to incr participation in ADLs.    Baseline  dependent    Time  5    Period  Weeks    Status  Partially Met Pt is starting to use a schedule, 10/03/17:  ongoing education, pt/grandma verbalized understanding of strategies.  10/16/17:  partially met as pt education ongoing and pt/grandmother have not fully implemented all strategies yet (continue goal)   Pt is starting to use a schedule, 10/03/17:  ongoing education,  pt/grandma verbalized understanding of strategies.  10/16/17:  partially met as pt education ongoing and pt/grandmother have not fully implemented all strategies yet (continue goal)     Additional Short Term Goals   Additional Short Term Goals  Yes      OT SHORT TERM GOAL #6   Title  --    Baseline  --        OT Long Term Goals - 10/31/17 1534      OT LONG TERM GOAL #1   Title  Pt/caregiver will be independent with updated HEP/functional tasks for home.    Baseline  dependent    Time  5    Period  Weeks    Status  Partially Met 10/16/17:  not fully met, needs review as pt/caregiver has not been able to implement fully at home yet due to pt cognitive deficits (continue goal)   10/16/17:  not fully met, needs review as pt/caregiver has not been able to implement fully at home yet due to pt cognitive deficits (continue goal)     OT LONG TERM GOAL #2   Title  Pt will perform bathing/dressing without cueing (distant supervision).    Baseline  min A, and prompts    Time  5    Period  Weeks    Status  Partially Met 10/16/17:  met for dressing, not met for bathing due precautions due to catheter (min A and cueing)--continue goal   10/16/17:  met for dressing, not met for bathing due precautions due to catheter (min A and cueing)--continue goal     OT LONG TERM GOAL #3   Title  Pt will follow simple schedule/routine for ADLs with mod cues.    Baseline  pt needs cueing, assistance for initiation and throughout ADLs, decr attention/ability to follow directions    Time  5    Period  Weeks    Status  Partially Met  10/03/17:  revised goal.  10/16/17:  max cues due to decr initiation, attention, memory (continue goal)   10/03/17:  revised goal.  10/16/17:  max cues due to decr initiation, attention, memory (continue goal)     OT LONG TERM GOAL #4   Title  Pt will perform simple snack prep with min cueing/supervision.    Baseline  not performing/dependent    Time  8    Period  Weeks     Status  Achieved 10/03/17:  in familiar environment per grandmother report   10/03/17:  in familiar environment per grandmother report     OT LONG TERM GOAL #5   Title  Pt will perform simple home maintenance task with supervision/min cueing.    Baseline  not performing/dependent    Time  8    Period  Weeks    Status  Achieved 10/16/17: at approx this level for folding clothes, sweeping   10/16/17: at approx this level for folding clothes, sweeping     OT LONG TERM GOAL #6   Title  Pt will perform at least 4 different home maintenance tasks consistently with supervision/min cues to initiate only.    Baseline  currently sweeping inconsistently and folding clothes only    Time  5    Period  Weeks    Status  New            Plan - 10/31/17 1414    Clinical Impression Statement  Pt is slowly progressing towards goals with improving participation in home maintenance tasks.  Pt still has not brought written shedule to therapy.  Encouraged pt/grandmother to ensure pt is working on orientation, atttention, and memory throughout the day for short periods of time and with use of schedule.  Unfortunately, progress is affected by inconsistent living situation/environment and caregiver.      Rehab Potential  Fair    Current Impairments/barriers affecting progress:  cognitive deficits, urinary catheter, severity of deficits    OT Frequency  1x / week    OT Duration  -- for 5 weeks   for 5 weeks   OT Treatment/Interventions  Self-care/ADL training;Therapeutic exercise;DME and/or AE instruction;Therapist, nutritional;Therapeutic activities;Patient/family education;Cognitive remediation/compensation;Neuromuscular education;Visual/perceptual remediation/compensation;Therapeutic exercises    Plan  discuss/review pt's daily schedule if he brings in; continue with strategies for incr ADL/IADL performance, attention/memory/orientation for ADLs    Consulted and Agree with Plan of Care  Patient;Family  member/caregiver    Family Member Consulted  grandmother--Sue, Mom       Patient will benefit from skilled therapeutic intervention in order to improve the following deficits and impairments:  Decreased balance, Impaired vision/preception, Decreased cognition, Decreased knowledge of precautions, Impaired perceived functional ability, Impaired UE functional use, Decreased strength, Decreased safety awareness, Decreased knowledge of use of DME, Decreased coordination, Decreased activity tolerance  Visit Diagnosis: Attention and concentration deficit  Visuospatial deficit  Other lack of coordination  Frontal lobe and executive function deficit    Problem List Patient Active Problem List   Diagnosis Date Noted  . Seizure disorder ([place]) 09/12/2017  . Urinary retention 07/12/2017  . Acute renal insufficiency 07/12/2017  . Acute cystitis without hematuria   . Hypotension   . 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 Acute urinary retention 06/26/2017  . Staphylococcus epidermidis ventriculitis 06/26/2017  . Bacteremia due to Streptococcus pneumoniae 06/26/2017  . Bordetella pertussis 06/26/2017  . 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 Transaminitis   .  Endotracheal tube present   . Status epilepticus ([place])   . History of syphilis   . Tobacco abuse   . Meningoencephalitis   . Cryptococcal meningitis ([place])   . Seizure ([place]) 05/20/2017  . Peri-rectal abscess 03/06/2017  . 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 Anemia 09/12/2016  . 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 Bipolar disorder ([place]) 04/08/2013  . Diaper rash 10/10/2012    [facility] 10/31/2017, 3:39 PM  [facility] [address] [place], [place], [address] Phone: [phone]   Fax:  [phone]  Name: Justin Ball MRN: [id number] Date of Birth:  Jun 13, 1991   Vianne Bulls, OTR/L [facility] [address] [place], [place]  [address] [phone] phone [phone] 10/31/17 3:39 PM

## 2017-11-02 ENCOUNTER — Encounter: Payer: No Typology Code available for payment source | Admitting: Occupational Therapy

## 2017-11-05 ENCOUNTER — Other Ambulatory Visit: Payer: Self-pay | Admitting: Pharmacist

## 2017-11-05 ENCOUNTER — Other Ambulatory Visit: Payer: Self-pay | Admitting: Pharmacist Clinician (PhC)/ Clinical Pharmacy Specialist

## 2017-11-05 ENCOUNTER — Ambulatory Visit: Payer: Medicaid Other

## 2017-11-05 ENCOUNTER — Other Ambulatory Visit: Payer: Self-pay

## 2017-11-05 DIAGNOSIS — R41841 Cognitive communication deficit: Secondary | ICD-10-CM | POA: Diagnosis not present

## 2017-11-05 MED ORDER — FLUCONAZOLE 200 MG PO TABS: 400.0000 mg | ORAL_TABLET | Freq: Every day | ORAL | 6 refills | Status: DC

## 2017-11-05 MED ORDER — SULFAMETHOXAZOLE-TRIMETHOPRIM 800-160 MG PO TABS: 1.0000 | ORAL_TABLET | ORAL | 6 refills | Status: DC

## 2017-11-05 MED FILL — ODEFSEY 200-25-25 MG TABS: 200-25-25 | 30 days supply | Qty: 30 | Fill #4

## 2017-11-05 MED FILL — FLUCONAZOLE 200 MG TAB: 200 | 30 days supply | Qty: 60 | Fill #0

## 2017-11-05 MED FILL — QUETIAPINE FUMARATE 25 MG T: 25 | 30 days supply | Qty: 60 | Fill #1

## 2017-11-05 MED FILL — SULFAMETHOXAZOLE-TMP DS TAB: 800-160 | 28 days supply | Qty: 12 | Fill #0

## 2017-11-05 MED FILL — levETIRAcetam 1000 MG TABS: 1000 | 30 days supply | Qty: 60 | Fill #2

## 2017-11-05 MED FILL — TIVICAY 50 MG TABLET: 50 | 30 days supply | Qty: 30 | Fill #4

## 2017-11-05 NOTE — Therapy (Signed)
[facility] [address] [place], [place], [address] Phone: [phone]   Fax:  [phone]  Speech Language Pathology Treatment  Patient Details  Name: Justin Ball MRN: [id number] Date of Birth: [birth year] Referring Provider: Tommy Medal, Roderic Scarce, MD   Encounter Date: 11/05/2017  End of Session - 11/05/17 1638    Visit Number  12    Number of Visits  20    Date for SLP Re-Evaluation  10/19/17    Authorization Type  AET[employer] - 25 visits combined - HARD LIMIT    Authorization Time Period  medicaid approved 12 visits 09/27/17 to 11/07/18    Authorization - Visit Number  4    Authorization - Number of Visits  12    SLP Start Time  8882    SLP Stop Time   1625    SLP Time Calculation (min)  49 min    Activity Tolerance  Patient tolerated treatment well       Past Medical History:  Diagnosis Date  . 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 Anxiety   . 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 Bipolar 1 disorder ([place])   . Chronic viral hepatitis B without coma and with delta agent ([place])   . Cognitive communication deficit    working w/ therapy  . 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 Depression   . 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 Foley catheter in place   . 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 History of scabies 2013  . History of syphilis   . 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 Neurogenic bladder    secondary to  damage from brain infection  . Neurogenic bowel    wears depends  . Neuromuscular disorder ([place])   . 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 Seizure disorder [facility])    neurologist-  dr Leta Baptist ([facility])--caused by cryptococcal meningitis/ multiple infarctions--  no seizure since hospitalization May 2018  . Status epilepticus ([place])   . Visuospatial deficit     Past Surgical History:  Procedure Laterality Date  . [place] ABSCESS  09/ 03/ 2011   dr toth  . TRANSTHORACIC ECHOCARDIOGRAM  06/02/2017   EF 65-70%/  trivial PR    There were no vitals filed for this visit.  Subjective Assessment - 11/05/17 1558    Subjective  Pt arrives iwth grandmother.    Currently in Pain?  No/denies            ADULT SLP TREATMENT - 11/05/17 1558      General Information   Behavior/Cognition  Alert;Cooperative;Distractible;Requires cueing;Pleasant mood;Confused;Decreased sustained attention      Treatment Provided   Treatment provided  Cognitive-Linquistic      Cognitive-Linquistic Treatment   Treatment focused on  Cognition    Skilled Treatment  Simple attention/problem solving counting money (coins) with usual min A to attend to type of coin needed vs the type of coing he picked up, fading to rare min A until 8 minutes of  task, then cueing incr'd again to usual mod-max A. Min-mod A initially for emergent awareness on this task, faded to min A rarely, then again after 8 minutes - max A needed usually. functional simple time problem solving ( X:00) with visual cues required usual max A for attention to detail. SLP told pt grandmother that Medicaid auth only goes until 11-07-17 and that pt would need date extension for visits after 11-07-17, and that 10 calendar days needed for auth to be returned prior to scheduling next [place] appointments. SLP also told grandmother that all STs and pt's primary OT agree pt would benefit best from consistency of a day program.       Assessment  / Recommendations / [place] with current plan of care      Progression Toward Goals   Progression toward goals  Not progressing toward goals (comment) attention, awareness, memory hinder progress         SLP Short Term Goals - 11/05/17 1641      SLP SHORT TERM GOAL #1   Title  pt will demo sustained attention for 5 minutes, in 3 different tasks completed 90% success, in 3 sessions    Status  Not Met      SLP SHORT TERM GOAL #2   Title  pt will tell SLP 2 cognitive defict areas with min A occasionallly, in three sessions    Status  Not Met      SLP SHORT TERM GOAL #3   Title  pt will demo orientation to year, month, date with modified independence (calendar)    Status  Not Met      SLP SHORT TERM GOAL #4   Title  pt will demo sustained attention to one task for 10 minutes to achieve 90% success    Status  Not Met       SLP Long Term Goals - 11/05/17 1641      SLP LONG TERM GOAL #1   Title  pt will demonstrate sustained attention to one task for 15 mintues in order to achieve 90% success with rare nonverbal cues back to task, in two sessions    Baseline  7 minutes (11-05-17)    Time  2    Period  Weeks or 20 visits    Status  On-going      SLP LONG TERM GOAL #2   Title  pt will demo emergent awareness in a simple paper/pencil cognitive linguistic task 60% of the time given pt double checking responses    Baseline  <20% of the time (11-05-17)    Time  2    Period  Weeks or 20 visits    Status  On-going      SLP LONG TERM GOAL #3   Title  pt will demo orientation to month, day, date with modified independence over 8 sessions    Baseline  one session (11-05-17)    Time  2    Period  Weeks or 20 visits    Status  On-going       Plan - 11/05/17 1639    Clinical Impression Statement  Pt continues to require variable but consistent SLP A for simple problem solving, organization, memory and orientation. Pt would benefit most from a consistent program of  care, such as a day program. Continue skilled ST to maximize cognitive lingusitic skills for improved safety and to reduce burden of care.     Speech Therapy Frequency  2x /  week    Treatment/Interventions  Language facilitation;Compensatory techniques;Internal/external aids;SLP instruction and feedback;Cognitive reorganization;Environmental controls;Multimodal communcation approach;Functional tasks;Cueing hierarchy;Patient/family education    Potential Considerations  Severity of impairments;Cooperation/participation level;Family/community support;Ability to learn/carryover information    SLP Home Exercise Plan  home tasks provided today    Consulted and Agree with Plan of Care  Patient;Family member/caregiver    Family Member Consulted  grandmother Collie Siad       Patient will benefit from skilled therapeutic intervention in order to improve the following deficits and impairments:   Cognitive communication deficit    Problem List Patient Active Problem List   Diagnosis Date Noted  . Seizure disorder ([place]) 09/12/2017  . Urinary retention 07/12/2017  . Acute renal insufficiency 07/12/2017  . Acute cystitis without hematuria   . Hypotension   . 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 Acute urinary retention 06/26/2017  . Staphylococcus epidermidis ventriculitis 06/26/2017  . Bacteremia due to Streptococcus pneumoniae 06/26/2017  . Bordetella pertussis 06/26/2017  . 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 Transaminitis   . Endotracheal tube present   . Status epilepticus ([place])   . History of syphilis   . Tobacco abuse   . Meningoencephalitis   . Cryptococcal meningitis ([place])   . Seizure ([place]) 05/20/2017  . Peri-rectal abscess 03/06/2017  . 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 Anemia 09/12/2016  . 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 Bipolar disorder ([place]) 04/08/2013  . Diaper rash  10/10/2012    [facility] ,[place], CCC-SLP  11/05/2017, 4:42 PM  [facility] [address] [place], [place], [address] Phone: [phone]   Fax:  [phone]   Name: ARYE WEYENBERG MRN: [id number] Date of Birth: [birth year]

## 2017-11-05 NOTE — Progress Notes (Signed)
[facility].

## 2017-11-05 NOTE — Telephone Encounter (Signed)
Thanks so much Minh! 

## 2017-11-05 NOTE — Telephone Encounter (Addendum)
Left a message for his grandmother to call back because Justin Ball has not pick up his fluconazole again.   Addendum  Grandmother will pick up today.   Justin SouthwardMinh Emiliano Ball, Justin Ball, Justin Ball, Justin Ball, Justin Ball Infectious Disease Pharmacist Pager: [phone] 11/05/2017 2:27 PM

## 2017-11-06 ENCOUNTER — Encounter: Payer: Self-pay | Admitting: Speech Pathology

## 2017-11-06 ENCOUNTER — Ambulatory Visit: Payer: Medicaid Other | Admitting: Speech Pathology

## 2017-11-06 ENCOUNTER — Encounter: Payer: No Typology Code available for payment source | Admitting: Occupational Therapy

## 2017-11-06 ENCOUNTER — Ambulatory Visit (INDEPENDENT_AMBULATORY_CARE_PROVIDER_SITE_OTHER)
Payer: No Typology Code available for payment source | Admitting: Pharmacist Clinician (PhC)/ Clinical Pharmacy Specialist

## 2017-11-06 DIAGNOSIS — B20 Human immunodeficiency virus [HIV] disease: Secondary | ICD-10-CM

## 2017-11-06 DIAGNOSIS — R41841 Cognitive communication deficit: Secondary | ICD-10-CM | POA: Diagnosis not present

## 2017-11-06 NOTE — Therapy (Signed)
[facility] [address] [place], [place], [address] Phone: [phone]   Fax:  [phone]  Speech Language Pathology Treatment  Patient Details  Name: Justin Ball MRN: [id number] Date of Birth: Aug 24, 1991 Referring Provider: Tommy Medal, Roderic Scarce, MD   Encounter Date: 11/06/2017  End of Session - 11/06/17 1512    Visit Number  13    Number of Visits  20    Date for SLP Re-Evaluation  10/19/17    Authorization Type  [employer] - 25 visits combined - HARD LIMIT    Authorization Time Period  medicaid approved 12 visits 09/27/17 to 11/07/18; extension requested by Lattie Haw 11/06/17    Authorization - Visit Number  5    Authorization - Number of Visits  12    SLP Start Time  4492    SLP Stop Time   1454    SLP Time Calculation (min)  51 min    Activity Tolerance  Patient tolerated treatment well       Past Medical History:  Diagnosis Date  . 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 Anxiety   . 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 Bipolar 1 disorder ([place])   . Chronic viral hepatitis B without coma and with delta agent ([place])   . Cognitive communication deficit    working w/ therapy  . 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 Depression   . 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 Foley catheter in place   . 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 History of scabies 2013  . History of syphilis   . 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 Neurogenic bowel    wears depends  . Neuromuscular disorder ([place])   . 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 Seizure disorder [facility])    neurologist-  dr Leta Baptist ([facility])--caused by cryptococcal meningitis/ multiple infarctions--  no seizure since hospitalization May 2018  . Status epilepticus ([place])   . Visuospatial deficit     Past Surgical History:  Procedure Laterality Date  . [place] ABSCESS  09/ 03/ 2011   dr toth  . TRANSTHORACIC ECHOCARDIOGRAM  06/02/2017   EF 65-70%/  trivial PR    There were no vitals filed for this visit.  Subjective Assessment - 11/06/17 1507    Subjective  "We've been working on the clock and  the money skills"    Patient is accompained by:  Family member    Special Tests  Collie Siad    Currently in Pain?  No/denies            ADULT SLP TREATMENT - 11/06/17 1507      General Information   Behavior/Cognition  Alert;Cooperative;Distractible;Requires cueing;Pleasant mood;Confused;Decreased sustained attention      Treatment Provided   Treatment provided  Cognitive-Linquistic      Cognitive-Linquistic Treatment   Treatment focused on  Cognition    Skilled Treatment  Simple attention and abstract reasoning  generating similarities and differences with usual mod A for more than 1 similiarity and 1 difference. Simple abstract reasoning problem solving ID'ing word that doesn't belong and why in reading/auditory presentation with usual mod to max A, questioning cues to ID categories.       Assessment / Recommendations / Plan   Plan  Continue with current plan of care      Progression Toward Goals   Progression toward goals  Not progressing toward goals (comment) forgot eyeglasses - memory, awareness and attention hinder p       SLP Education - 11/06/17 1510    Education provided  Yes    Education Details  continue home practice of ST activities.    Person(s)  Educated  Patient;Caregiver(s)    Methods  Explanation;Verbal cues    Comprehension  Verbalized understanding       SLP Short Term Goals - 11/06/17 1512      SLP SHORT TERM GOAL #1   Title  pt will demo sustained attention for 5 minutes, in 3 different tasks completed 90% success, in 3 sessions    Status  Not Met      SLP SHORT TERM GOAL #2   Title  pt will tell SLP 2 cognitive defict areas with min A occasionallly, in three sessions    Status  Not Met      SLP SHORT TERM GOAL #3   Title  pt will demo orientation to year, month, date with modified independence (calendar)    Status  Not Met      SLP SHORT TERM GOAL #4   Title  pt will demo sustained attention to one task for 10 minutes to achieve 90% success    Status  Not Met       SLP Long Term Goals - 11/06/17 1512      SLP LONG TERM GOAL #1   Title  pt will demonstrate sustained attention to one task for 15 mintues in order to achieve 90% success with rare nonverbal cues back to task, in two sessions    Baseline  7 minutes (11-05-17)    Time  2    Period  Weeks or 20 visits    Status  On-going      SLP LONG TERM GOAL #2   Title  pt will demo emergent awareness in a simple paper/pencil cognitive linguistic task 60% of the time given pt double checking responses    Baseline  <20% of the time (11-05-17)    Time  2    Period  Weeks or 20 visits    Status  On-going      SLP LONG TERM GOAL #3   Title  pt will demo orientation to month, day, date with modified independence over 8 sessions    Baseline  one session (11-05-17)    Time  2    Period  Weeks or 20 visits    Status  On-going       Plan - 11/06/17 1511    Clinical Impression Statement  Pt continues to require variable but consistent SLP A for simple problem solving, organization, memory and orientation. Pt would benefit most from a consistent program of care, such as a day program. Pt continues to require 24 hour care due to cognitive impairments. Continue  skilled ST to maximize cognitive lingusitic skills for improved safety and to reduce burden of care.     Speech Therapy Frequency  2x / week    Treatment/Interventions  Language facilitation;Compensatory techniques;Internal/external aids;SLP instruction and feedback;Cognitive reorganization;Environmental controls;Multimodal communcation approach;Functional tasks;Cueing hierarchy;Patient/family education    Potential Considerations  Severity of impairments;Cooperation/participation level;Family/community support;Ability to learn/carryover information    Consulted and Agree with Plan of Care  Patient;Family member/caregiver    Family Member Consulted  grandmother Collie Siad       Patient will benefit from skilled therapeutic intervention in order to improve the following deficits and impairments:   Cognitive communication deficit    Problem List Patient Active Problem List   Diagnosis Date Noted  . Seizure disorder ([place]) 09/12/2017  . Urinary retention 07/12/2017  . Acute renal insufficiency 07/12/2017  . Acute cystitis without hematuria   . Hypotension   . 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 Acute urinary retention 06/26/2017  . Staphylococcus epidermidis ventriculitis 06/26/2017  . Bacteremia due to Streptococcus pneumoniae 06/26/2017  . Bordetella pertussis 06/26/2017  . Chronic viral hepatitis B without coma and with delta agent ([place])   . Transaminitis   . Endotracheal tube present   . Status epilepticus ([place])   . History of syphilis   . Tobacco abuse   . Meningoencephalitis   . Cryptococcal meningitis ([place])   . Seizure ([place]) 05/20/2017  . Peri-rectal abscess 03/06/2017  . 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 Anemia 09/12/2016  . 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 Bipolar disorder ([place]) 04/08/2013  . Diaper rash 10/10/2012    Gladyes Kudo, Annye Rusk 11/06/2017, 3:14 PM  [facility] [address] [place], [place], [address] Phone: [phone]   Fax:  [phone]   Name: Justin Ball MRN: [id number] Date of Birth: 1991/01/10

## 2017-11-06 NOTE — Progress Notes (Signed)
HPI: Justin Ball is a 26 y.o. male who is here for his visit with pharmacy for adherence and labs.   Allergies: No Known Allergies  Vitals:    Past Medical History: Past Medical History:  Diagnosis Date  . 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 Anxiety   . 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 Bipolar 1 disorder (HCC)   . 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 Cognitive communication deficit    working w/ therapy  . 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 Depression   . 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 Foley catheter in place   . 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 History of scabies 2013  . History of syphilis   . 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 Neurogenic bowel    wears depends  . Neuromuscular disorder (HCC)   . 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 Seizure disorder [facility](HCC)    neurologist-  dr Marjory Liespenumalli ([facility])--caused by cryptococcal meningitis/ multiple infarctions--  no seizure since hospitalization May 2018  . Status epilepticus (HCC)   . Visuospatial deficit     Social History: Social History   Socioeconomic History  . Marital status: Single    Spouse name: Not on file  . Number of children: Not on file  . Years of education: Not on file  . Highest education level: Not on file  Social Needs  . Financial resource strain: Not on file  . Food insecurity  - worry: Not on file  . Food insecurity - inability: Not on file  . Transportation needs - medical: Not on file  . Transportation needs - non-medical: Not on file  Occupational History  . Not on file  Tobacco Use  . Smoking status: Current Every Day Smoker    Packs/day: 0.50    Years: 5.00    Pack years: 2.50    Types: Cigarettes  . Smokeless tobacco: Never Used  . Tobacco comment: cutting back  Substance and Sexual Activity  . Alcohol use: No    Alcohol/week: 0.0 oz  . Drug use: No  . Sexual activity: Yes    Partners: Male    Birth control/protection: Condom    Comment: irregular condom use; educated  Other Topics Concern  . Not on file  Social History Narrative  . Not on file    Previous Regimen: DRV/r/TRV/ DTG/TRV  Current Regimen: Odefsey/DTG  Labs: HIV 1 RNA Quant (copies/mL)  Date Value  10/15/2017 <20 DETECTED  09/13/2017 6,970 (H)  08/02/2017 66 (H)   CD4 T Cell Abs (/uL)  Date Value  10/15/2017 120 (L)  09/13/2017 110 (L)  08/02/2017 50 (L)   Hep B S Ab (no units)  Date Value  03/07/2017 Non Reactive   Hepatitis B Surface Ag (no units)  Date Value  03/07/2017 Confirm. indicated   HCV Ab (no units)  Date Value  10/10/2012 NEGATIVE  CrCl: CrCl cannot be calculated (Patient's most recent lab result is older than the maximum 21 days allowed.).  Lipids:    Component Value Date/Time   CHOL 125 09/28/2016 1717   TRIG 150 (H) 05/26/2017 1407   HDL 31 (L) 09/28/2016 1717   CHOLHDL 4.0 09/28/2016 1717   VLDL 24 09/28/2016 1717   LDLCALC 70 09/28/2016 1717    Assessment: Justin Ball is here with his grandmother for a visit with pharmacy for his adherence check up. For the first time in forever, he is suppressed on the current regimen. He grandmother makes sure that he takes his meds everyday. I made a calendar for her today and fill the pill box for the first week. His grandmother will fill it every week now.  His hep B is also coming under  control. We will repeat those labs today also along with his HIV labs. [facility] She paid $20 for those meds. We will see if we can waive it from now on. He is f/u with Dr. Daiva EvesVan Dam in Dec.   Recommendations:  Cont Odefsey/DTG daily with meal Cont fluconazole 400mg  PO qday Cont Bactrim DS TIW Cont Keppra 1g PO BID HIV VL, CD4, hep B labs today  Ulyses SouthwardMinh Rory Xiang, PharmD, BCPS, AAHIVP, CPP Clinical Infectious Disease Pharmacist [facility] 11/06/2017, 3:59 PM

## 2017-11-07 ENCOUNTER — Ambulatory Visit: Payer: No Typology Code available for payment source

## 2017-11-07 LAB — HEPATITIS B E ANTIBODY: Hep B E Ab: NONREACTIVE

## 2017-11-07 LAB — HEPATITIS B E ANTIGEN: HEP B E AG: REACTIVE — AB

## 2017-11-08 ENCOUNTER — Encounter: Payer: Self-pay | Admitting: Occupational Therapy

## 2017-11-08 ENCOUNTER — Ambulatory Visit: Payer: Medicaid Other | Admitting: Occupational Therapy

## 2017-11-08 DIAGNOSIS — R2689 Other abnormalities of gait and mobility: Secondary | ICD-10-CM

## 2017-11-08 DIAGNOSIS — R278 Other lack of coordination: Secondary | ICD-10-CM

## 2017-11-08 DIAGNOSIS — R41842 Visuospatial deficit: Secondary | ICD-10-CM

## 2017-11-08 DIAGNOSIS — M6281 Muscle weakness (generalized): Secondary | ICD-10-CM

## 2017-11-08 DIAGNOSIS — R41840 Attention and concentration deficit: Secondary | ICD-10-CM

## 2017-11-08 DIAGNOSIS — R2681 Unsteadiness on feet: Secondary | ICD-10-CM

## 2017-11-08 DIAGNOSIS — R41841 Cognitive communication deficit: Secondary | ICD-10-CM | POA: Diagnosis not present

## 2017-11-08 DIAGNOSIS — R41844 Frontal lobe and executive function deficit: Secondary | ICD-10-CM

## 2017-11-08 LAB — HIV-1 RNA QUANT-NO REFLEX-BLD
HIV 1 RNA QUANT: NOT DETECTED {copies}/mL
HIV-1 RNA QUANT, LOG: NOT DETECTED {Log_copies}/mL

## 2017-11-08 LAB — T-HELPER CELL (CD4) - (RCID CLINIC ONLY)
CD4 % Helper T Cell: 8 % — ABNORMAL LOW (ref 33–55)
CD4 T CELL ABS: 100 /uL — AB (ref 400–2700)

## 2017-11-08 NOTE — Therapy (Signed)
[facility] [address] [place], [place], [address] Phone: [phone]   Fax:  [phone]  Occupational Therapy Treatment  Patient Details  Name: Justin Ball MRN: [id number] Date of Birth: 1991/04/27 Referring Provider: Dr. Rhina Brackett Dam   Encounter Date: 11/08/2017  OT End of Session - 11/08/17 1408    Visit Number  13    Number of Visits  16 11+5=16    Date for OT Re-Evaluation  12/02/17    Authorization Type  [employer]--25 visits combined hard limit; **Medicaid now current    Authorization Time Period  [employer]--25 visits combined (PT used 3+OT used 11).  3 initial visits approved by MCD through 10/27/17.  Medicaid approved 5 OT visits 11/5-12/9/18    Authorization - Visit Number  2 Medicaid visits    Authorization - Number of Visits  5    OT Start Time  9179    OT Stop Time  1445    OT Time Calculation (min)  40 min    Activity Tolerance  Patient tolerated treatment well    Behavior During Therapy  WFL for tasks assessed/performed       Past Medical History:  Diagnosis Date  . 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 Anxiety   . 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 Bipolar 1 disorder ([place])   . Chronic viral hepatitis B without coma and with delta agent ([place])   . Cognitive communication deficit    working w/ therapy  . 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 Depression   . 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 Foley catheter in place   . 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 History of cerebral infarction 05/20/2017   multiple ischemic  infarction secondary to infection  . History of scabies 2013  . History of syphilis   . 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 Neurogenic bowel    wears depends  . Neuromuscular disorder ([place])   . 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 Seizure disorder [facility])    neurologist-  dr Leta Baptist ([place] [facility])--caused by cryptococcal meningitis/ multiple infarctions--  no seizure since hospitalization May 2018  . Status epilepticus ([place])   . Visuospatial deficit     Past Surgical History:  Procedure Laterality Date  . CYSTOSCOPY N/A 10/10/2017   Procedure: CYSTOSCOPY;  Surgeon: Alexis Frock, MD;  Location: [facility];  Service: Urology;  Laterality: N/A;  . EXAMINATION UNDER ANESTHESIA N/A 12/28/2014   Procedure: EXAM UNDER ANESTHESIA;  Surgeon: Michael Boston, MD;  Location: [facility];  Service: General;  Laterality: N/A;  . FLOOR OF MOUTH BIOPSY N/A 09/16/2016   Procedure: BIOPSY OF ORAL ABCESS;  Surgeon: Leta Baptist, MD;  Location: [place];  Service: ENT;  Laterality: N/A;  . INCISION AND DRAINAGE ABSCESS N/A 09/16/2016   Procedure: INCISION AND DRAINAGE ABSCESS;  Surgeon: Leta Baptist, MD;  Location: [place];  Service: ENT;  Laterality: N/A;  . [place]  09/ 03/ 2011   dr toth  . INCISION  AND DRAINAGE PERIRECTAL ABSCESS N/A 12/28/2014   Procedure: IRRIGATION AND DEBRIDEMENT PERIRECTAL ABSCESS;  Surgeon: Michael Boston, MD;  Location: [facility];  Service: General;  Laterality: N/A;  Fistula repair and ablation  . INSERTION OF SUPRAPUBIC CATHETER N/A 10/10/2017   Procedure: INSERTION OF SUPRAPUBIC CATHETER;  Surgeon: Alexis Frock, MD;  Location: [facility];  Service: Urology;  Laterality: N/A;  . LAPAROSCOPIC REVISION VENTRICULAR-PERITONEAL (V-P) SHUNT N/A 06/12/2017   Procedure: LAPAROSCOPIC REVISION VENTRICULAR-PERITONEAL (V-P) SHUNT;  Surgeon: Kinsinger, Arta Bruce, MD;  Location: [facility];  Service: General;  Laterality: N/A;  . TRANSTHORACIC ECHOCARDIOGRAM  06/02/2017   EF 65-70%/  trivial PR  . VENTRICULOPERITONEAL SHUNT N/A 06/12/2017   Procedure: SHUNT INSERTION VENTRICULAR-PERITONEAL;  Surgeon: Consuella Lose, MD;  Location: [place];  Service: Neurosurgery;  Laterality: N/A;    There were no vitals filed for this visit.  Subjective Assessment - 11/08/17 1407    Subjective   doing ok    Patient is accompained by:  Family member grandmother    Pertinent History  Hospitalized 5/27-06/27/17 with cryptococcal meningitis/encephalitis/neurosyphillis and VP shunt placement; AIDS, Hep B, hx of syphilis, bipolar disorder, seizure hx, anemia, urinary catheter, staphylococcus epidermidis, ventriculitis, hypotension, renal insuffiency, hx of substance abuse, suprapubic cathether    Limitations  AIDS, Hep B, hx of syphilis, seizure hx, s/p VP shunt, suprapubic catheter, cognitive deficits    Patient Stated Goals  be able to take care of myself    Currently in Pain?  No/denies    Pain Onset  In the past 7 days        Playing Trouble for incr attention, problem solving, and attention.  Pt reports that he is familiar with the game Trouble.  Pt needed mod cueing to recall rules and objective of the game.  Pt able to follow rules but needed min-mod cueing for problem-solving/strategy and for following path due to visual-perceptual deficits.  Pt also needed cueing towards end of the game for counting spaces correctly likely due to attention.    Reviewed example of games/activities to continue to work on visual scanning, problem solving, attention, and memory.  Pt/grandmother verbalized understanding.  Grandmother reports that pt may not be able to go to a day program as she is concerned that due to his age he will not want to go to one with all order adults and that he won't fit in.  Cory Roughen is still trying to line up personal care aide.  Grandmother also  continues to report struggles with getting financial resources at her house where pt has been staying.  Gave grandmother information on setting up healthcare power of attorney.  Grandmother inquires about driving.  Pt/grandmother strongly advised against pt driving due to significant cognitive deficits and visual deficits.  Discussed that pt needs to be independent with BADLs and simple IADLs prior to considering driving.  Recommended driving evaluation due prior to return to driving (by OT trained in driving evaluation).  Also recommended that after OT d/c that pt may be appropriate to pursue Vocational Rehab as next step, but that driving was high-level and complex task that is cognitively, visually, and physical demanding and that this is not realistic for near future.  Pt/grandmother verbalized understanding.   Discussed/continued recommendations for use of daily schedule and making appropriate additions to schedule.  Reviewed recommendation to question pt about time and cue him to refer to daily schedule.  OT Short Term Goals - 11/08/17 1606      OT SHORT TERM GOAL #1   Title  Pt/caregiver will be independent with initial HEP/functional tasks for home.    Baseline  dependent    Time  4    Period  Weeks    Status  Achieved      OT SHORT TERM GOAL #2   Title  Pt will perform UB dressing consistently without cueing.    Time  4    Period  Weeks    Status  Achieved removing shirt min A due to long hair      OT SHORT TERM GOAL #3   Title  Pt will be able to choose/retrieve appropriate clothing without verbal prompts.    Baseline  not performing/set up currently    Time  4    Period  Weeks    Status  Achieved able to choose between 2 options.  10/16/17:  met      OT SHORT TERM GOAL #4   Title  Pt will be able to fold laundry with set-up.    Baseline  dependent/not performing    Time  4    Period  Weeks    Status  Achieved 10/16/17:  min verbal cues  for improved alignment, but is performing at home      OT [place] #5   Title  Pt/caregiver will verbalize understanding of cognitive compensation strategies for ADLs/functional tasks to incr participation in ADLs.    Baseline  dependent    Time  5    Period  Weeks    Status  On-going Pt is starting to use a schedule, 10/03/17:  ongoing education, pt/grandma verbalized understanding of strategies.  10/16/17:  partially met as pt education ongoing and pt/grandmother have not fully implemented all strategies yet (continue goal)        OT Long Term Goals - 11/08/17 1606      OT LONG TERM GOAL #1   Title  Pt/caregiver will be independent with updated HEP/functional tasks for home.    Baseline  dependent    Time  5    Period  Weeks    Status  On-going 10/16/17:  not fully met, needs review as pt/caregiver has not been able to implement fully at home yet due to pt cognitive deficits (continue goal)      OT LONG TERM GOAL #2   Title  Pt will perform bathing/dressing without cueing (distant supervision).    Baseline  min A, and prompts    Time  5    Period  Weeks    Status  On-going 10/16/17:  met for dressing, not met for bathing due precautions due to catheter (min A and cueing)--continue goal      OT LONG TERM GOAL #3   Title  Pt will follow simple schedule/routine for ADLs with mod cues.    Baseline  pt needs cueing, assistance for initiation and throughout ADLs, decr attention/ability to follow directions    Time  5    Period  Weeks    Status  On-going 10/03/17:  revised goal.  10/16/17:  max cues due to decr initiation, attention, memory (continue goal)      OT LONG TERM GOAL #4   Title  Pt will perform simple snack prep with min cueing/supervision.    Baseline  not performing/dependent    Time  8    Period  Weeks    Status  Achieved 10/03/17:  in familiar environment per grandmother report      OT LONG TERM GOAL #5   Title  Pt will perform simple home maintenance task  with supervision/min cueing.    Baseline  not performing/dependent    Time  8    Period  Weeks    Status  Achieved 10/16/17: at approx this level for folding clothes, sweeping      OT LONG TERM GOAL #6   Title  Pt will perform at least 4 different home maintenance tasks consistently with supervision/min cues to initiate only.    Baseline  currently sweeping inconsistently and folding clothes only    Time  5    Period  Weeks    Status  New            Plan - 11/08/17 1408    Clinical Impression Statement  Pt is slowly progressing with attention and is more engaged in converstation/session.      Rehab Potential  Fair    Current Impairments/barriers affecting progress:  cognitive deficits, urinary catheter, severity of deficits    OT Frequency  1x / week    OT Duration  -- for 5 weeks    OT Treatment/Interventions  Self-care/ADL training;Therapeutic exercise;DME and/or AE instruction;Therapist, nutritional;Therapeutic activities;Patient/family education;Cognitive remediation/compensation;Neuromuscular education;Visual/perceptual remediation/compensation;Therapeutic exercises    Plan  continue with strategies for incr ADL/IADL performance, attention/memory/orientation for ADLs    Consulted and Agree with Plan of Care  Patient;Family member/caregiver    Family Member Consulted  grandmother--Sue, Mom       Patient will benefit from skilled therapeutic intervention in order to improve the following deficits and impairments:  Decreased balance, Impaired vision/preception, Decreased cognition, Decreased knowledge of precautions, Impaired perceived functional ability, Impaired UE functional use, Decreased strength, Decreased safety awareness, Decreased knowledge of use of DME, Decreased coordination, Decreased activity tolerance  Visit Diagnosis: Attention and concentration deficit  Visuospatial deficit  Other lack of coordination  Frontal lobe and executive function  deficit  Muscle weakness (generalized)  Unsteadiness on feet  Other abnormalities of gait and mobility    Problem List Patient Active Problem List   Diagnosis Date Noted  . Seizure disorder ([place]) 09/12/2017  . Urinary retention 07/12/2017  . Acute renal insufficiency 07/12/2017  . Acute cystitis without hematuria   . Hypotension   . 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 Acute urinary retention 06/26/2017  . Staphylococcus epidermidis ventriculitis 06/26/2017  . Bacteremia due to Streptococcus pneumoniae 06/26/2017  . Bordetella pertussis 06/26/2017  . Chronic viral hepatitis B without coma and with delta agent ([place])   . Transaminitis   . Endotracheal tube present   . Status epilepticus ([place])   . History of syphilis   . Tobacco abuse   . Meningoencephalitis   . Cryptococcal meningitis ([place])   . Seizure ([place]) 05/20/2017  . Peri-rectal abscess 03/06/2017  . 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 Anemia 09/12/2016  . 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 Bipolar disorder ([place]) 04/08/2013  . Diaper rash 10/10/2012    [facility] 11/08/2017, 4:08 PM  [facility] [address] [place], [place], [address] Phone: [phone]   Fax:  [phone]  Name: KANIEL KIANG MRN: [id number] Date of Birth: [birth year]   Vianne Bulls, OTR/L [facility] [address] [place], [place]  [address] [phone] phone [phone] 11/08/17 4:08 PM

## 2017-11-09 ENCOUNTER — Telehealth: Payer: Self-pay | Admitting: *Deleted

## 2017-11-09 LAB — HEPATITIS B DNA, ULTRAQUANTITATIVE, PCR
HEPATITIS B DNA (CALC): 3.06 {Log_IU}/mL — AB
HEPATITIS B DNA: 1150 [IU]/mL — AB

## 2017-11-09 NOTE — Telephone Encounter (Signed)
Patient's mother called requesting an order from Dr. Daiva EvesVan Dam for depends, wipes, gloves, and urostomy bags. Asking that this order be sent to [facility].

## 2017-11-09 NOTE — Telephone Encounter (Signed)
I dont know how to put such an order in Epic. If you write one up on another script I can sign it and we can fax in

## 2017-11-12 ENCOUNTER — Ambulatory Visit: Payer: Medicaid Other | Admitting: Occupational Therapy

## 2017-11-12 ENCOUNTER — Encounter: Payer: Self-pay | Admitting: Occupational Therapy

## 2017-11-12 DIAGNOSIS — R41841 Cognitive communication deficit: Secondary | ICD-10-CM | POA: Diagnosis not present

## 2017-11-12 DIAGNOSIS — R41844 Frontal lobe and executive function deficit: Secondary | ICD-10-CM

## 2017-11-12 DIAGNOSIS — R41840 Attention and concentration deficit: Secondary | ICD-10-CM

## 2017-11-12 DIAGNOSIS — R278 Other lack of coordination: Secondary | ICD-10-CM

## 2017-11-12 DIAGNOSIS — R41842 Visuospatial deficit: Secondary | ICD-10-CM

## 2017-11-12 NOTE — Therapy (Signed)
[facility] [address] [place], [place], [address] Phone: [phone]   Fax:  [phone]  Occupational Therapy Treatment  Patient Details  Name: Justin Ball MRN: [id number] Date of Birth: 09-Oct-1991 Referring Provider: Dr. Rhina Brackett Dam   Encounter Date: 11/12/2017  OT End of Session - 11/12/17 1346    Visit Number  14    Number of Visits  16 11+5=16    Date for OT Re-Evaluation  12/02/17    Authorization Type  [employer]--25 visits combined hard limit; **Medicaid now current    Authorization Time Period  [employer]--25 visits combined (PT used 3+OT used 11).  3 initial visits approved by MCD through 10/27/17.  Medicaid approved 5 OT visits 11/5-12/9/18    Authorization - Visit Number  3 Medicaid visits    Authorization - Number of Visits  5    OT Start Time  3664    OT Stop Time  1400    OT Time Calculation (min)  45 min    Activity Tolerance  Patient tolerated treatment well    Behavior During Therapy  WFL for tasks assessed/performed       Past Medical History:  Diagnosis Date  . 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 Anxiety   . 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 Bipolar 1 disorder ([place])   . Chronic viral hepatitis B without coma and with delta agent ([place])   . Cognitive communication deficit    working w/ therapy  . 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 Depression   . 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 Foley catheter in place   . 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 History of cerebral infarction 05/20/2017   multiple ischemic  infarction secondary to infection  . History of scabies 2013  . History of syphilis   . 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 Neurogenic bowel    wears depends  . Neuromuscular disorder ([place])   . 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 Seizure disorder [facility])    neurologist-  dr Leta Baptist ([facility])--caused by cryptococcal meningitis/ multiple infarctions--  no seizure since hospitalization May 2018  . Status epilepticus ([place])   . Visuospatial deficit     Past Surgical History:  Procedure Laterality Date  . BIOPSY OF ORAL ABCESS N/A 09/16/2016   Performed by Leta Baptist, MD at [facility] OR  . CYSTOSCOPY N/A 10/10/2017   Performed by Alexis Frock, MD at [facility]  . EXAM UNDER ANESTHESIA N/A 12/28/2014   Performed by Michael Boston, MD at [facility] ORS  . INCISION AND DRAINAGE ABSCESS N/A 09/16/2016   Performed by Leta Baptist, MD at [facility] OR  . [place] ABSCESS  09/ 03/ 2011   dr toth  . INSERTION OF SUPRAPUBIC CATHETER N/A 10/10/2017   Performed by Alexis Frock, MD at [facility]  . IRRIGATION AND DEBRIDEMENT PERIRECTAL ABSCESS N/A 12/28/2014   Performed by Michael Boston, MD at [facility] ORS  . LAPAROSCOPIC REVISION VENTRICULAR-PERITONEAL (V-P) SHUNT N/A  06/12/2017   Performed by Kinsinger, Arta Bruce, MD at [place] N/A 06/12/2017   Performed by Consuella Lose, MD at [place]  . TRANSTHORACIC ECHOCARDIOGRAM  06/02/2017   EF 65-70%/  trivial PR    There were no vitals filed for this visit.  Subjective Assessment - 11/12/17 1322    Subjective   Pt reports that he is making his bed and sweeping.  [name] reports that they went to Social Services to transfer funds to grandmother house.  Will try to get personal care next week.      Patient is accompained by:  Family member grandmother    Pertinent History  Hospitalized  5/27-06/27/17 with cryptococcal meningitis/encephalitis/neurosyphillis and VP shunt placement; AIDS, Hep B, hx of syphilis, bipolar disorder, seizure hx, anemia, urinary catheter, staphylococcus epidermidis, ventriculitis, hypotension, renal insuffiency, hx of substance abuse, suprapubic cathether    Limitations  AIDS, Hep B, hx of syphilis, seizure hx, s/p VP shunt, suprapubic catheter, cognitive deficits    Patient Stated Goals  be able to take care of myself    Currently in Pain?  No/denies    Pain Onset  In the past 7 days       Reviewed importance of using daily schedule with time to improve orientation, incr pt responsibility/accountability, and compensate for memory deficits.  Pt to bring schedule with him to next session.  Discussed that if can not answer question about daily activities, he should be instructed to refer to written schedule.  Also discussed that smoking and toileting schedule/routine may also be helpful for decr smoking and decr accidents.  Reviewed importance of adding cleaning/IADL tasks as well as cognitive tasks to engage pt and reviewed examples (board/card games, word searches, puzzles, sorting activities, telling time/orientation activities, reading simple children books and answering questions, etc).  Pt/grandmother verbalized understanding.  Educated pt/grandmother that pt should not perform financial management tasks alone (due to cognitive deficits including difficulty with orientation/dates, counting money, calculating change, memory deficits, and consequences if he makes mistakes), but may be involved in discussions.  Pt/grandmother verbalized understanding.  Reviewed that pt is not safe to drive due to significant cognitive and visual-perceptual deficits.  Pt/grandmother verbalized understanding.  Pt/grandmother given information for neuro-ophthalmologist due to visual-perceptual deficits.  Discussed that glasses may help, but not correct all visual deficits due to  neurologic changes, but that neuro ophthalmology may be beneficial.   Grandmother to discuss referral to ophthalmology/neuro ophthalmology with PCP as MD referral may be needed due to Medicaid. Pt/grandmother verbalized understanding.  Pt/grandmother also given info on [employer] [facility] [employer] [facility] program.    Tabletop visual scanning to locate words in large print horizontal word search.  Used line guide with min A due to initial difficulty then with min-mod cueing for organization and scanning.  Pt/grandmother instructed in how line guides may be beneficial to help with functional reading.                         OT Short Term Goals - 11/12/17 1648      OT SHORT TERM GOAL #1   Title  --      OT SHORT TERM GOAL #2   Title  --    Time  --    Status  --      OT SHORT TERM GOAL #3   Title  --      OT SHORT TERM GOAL #4  Title  --      OT SHORT TERM GOAL #5   Title  Pt/caregiver will verbalize understanding of cognitive compensation strategies for ADLs/functional tasks to incr participation in ADLs.    Baseline  dependent    Time  5    Period  Weeks    Status  On-going Pt is starting to use a schedule, 10/03/17:  ongoing education, pt/grandma verbalized understanding of strategies.  10/16/17:  partially met as pt education ongoing and pt/grandmother have not fully implemented all strategies yet (continue goal)        OT Long Term Goals - 11/12/17 1650      OT LONG TERM GOAL #1   Title  Pt/caregiver will be independent with updated HEP/functional tasks for home.    Baseline  dependent    Time  5    Period  Weeks    Status  On-going 10/16/17:  not fully met, needs review as pt/caregiver has not been able to implement fully at home yet due to pt cognitive deficits (continue goal)      OT LONG TERM GOAL #2   Title  Pt will perform bathing/dressing without cueing (distant supervision).    Baseline  min A, and prompts    Time  5    Period   Weeks    Status  On-going 10/16/17:  met for dressing, not met for bathing due precautions due to catheter (min A and cueing)--continue goal      OT LONG TERM GOAL #3   Title  Pt will follow simple schedule/routine for ADLs with mod cues.    Baseline  pt needs cueing, assistance for initiation and throughout ADLs, decr attention/ability to follow directions    Time  5    Period  Weeks    Status  On-going 10/03/17:  revised goal.  10/16/17:  max cues due to decr initiation, attention, memory (continue goal)      OT LONG TERM GOAL #4   Title  --      OT LONG TERM GOAL #5   Title  --    Time  --      OT LONG TERM GOAL #6   Title  Pt will perform at least 4 different home maintenance tasks consistently with supervision/min cues to initiate only.    Baseline  currently sweeping inconsistently and folding clothes only    Time  5    Period  Weeks    Status  On-going 11/12/17:  sweeping, making bed, folding clothes            Plan - 11/12/17 1645    Clinical Impression Statement  Pt is progressing slowly with improvements in attention and orientation, but continues to need supervision and structure due to cognitive deficits.    Rehab Potential  Fair    Current Impairments/barriers affecting progress:  cognitive deficits, urinary catheter, severity of deficits    OT Frequency  1x / week    OT Duration  -- for 5 weeks    OT Treatment/Interventions  Self-care/ADL training;Therapeutic exercise;DME and/or AE instruction;Therapist, nutritional;Therapeutic activities;Patient/family education;Cognitive remediation/compensation;Neuromuscular education;Visual/perceptual remediation/compensation;Therapeutic exercises    Plan  pt to bring in written daily schedule for therapy; give info for Vocational Rehab; attention/memory/orientation for ADLs    Consulted and Agree with Plan of Care  Patient;Family member/caregiver    Family Member Consulted  grandmother--Sue, Mom       Patient will  benefit from skilled therapeutic intervention in order to improve the following  deficits and impairments:  Decreased balance, Impaired vision/preception, Decreased cognition, Decreased knowledge of precautions, Impaired perceived functional ability, Impaired UE functional use, Decreased strength, Decreased safety awareness, Decreased knowledge of use of DME, Decreased coordination, Decreased activity tolerance  Visit Diagnosis: Attention and concentration deficit  Visuospatial deficit  Other lack of coordination  Frontal lobe and executive function deficit    Problem List Patient Active Problem List   Diagnosis Date Noted  . Seizure disorder ([place]) 09/12/2017  . Urinary retention 07/12/2017  . Acute renal insufficiency 07/12/2017  . Acute cystitis without hematuria   . Hypotension   . 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 Acute urinary retention 06/26/2017  . Staphylococcus epidermidis ventriculitis 06/26/2017  . Bacteremia due to Streptococcus pneumoniae 06/26/2017  . Bordetella pertussis 06/26/2017  . 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 Transaminitis   . Endotracheal tube present   . Status epilepticus ([place])   . History of syphilis   . Tobacco abuse   . Meningoencephalitis   . Cryptococcal meningitis ([place])   . Seizure ([place]) 05/20/2017  . Peri-rectal abscess 03/06/2017  . 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 Anemia 09/12/2016  . 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 Bipolar disorder ([place]) 04/08/2013  . Diaper rash 10/10/2012    [facility] 11/12/2017, 4:52 PM  [facility] [address] [place], [place], [address] Phone: [phone]   Fax:  [phone]  Name: THAINE GARRIGA MRN: [id number] Date of Birth: [birth year]   Vianne Bulls, OTR/L [facility] [address]  [place], [place]  [address] [phone] phone [phone] 11/12/17 4:52 PM

## 2017-11-12 NOTE — Telephone Encounter (Signed)
Order written and signed by Dr. Daiva EvesVan Dam. Faxed to [facility] (Durable medical equip dept) and patient's grandmother notified. Confirmation received

## 2017-11-12 NOTE — Telephone Encounter (Signed)
Thanks

## 2017-11-19 ENCOUNTER — Encounter: Payer: Self-pay | Admitting: Infectious Disease

## 2017-11-19 ENCOUNTER — Ambulatory Visit: Payer: Medicaid Other | Admitting: Occupational Therapy

## 2017-11-19 DIAGNOSIS — R159 Full incontinence of feces: Secondary | ICD-10-CM | POA: Insufficient documentation

## 2017-11-19 DIAGNOSIS — R41840 Attention and concentration deficit: Secondary | ICD-10-CM

## 2017-11-19 DIAGNOSIS — R278 Other lack of coordination: Secondary | ICD-10-CM

## 2017-11-19 DIAGNOSIS — R41844 Frontal lobe and executive function deficit: Secondary | ICD-10-CM

## 2017-11-19 DIAGNOSIS — R41841 Cognitive communication deficit: Secondary | ICD-10-CM | POA: Diagnosis not present

## 2017-11-19 DIAGNOSIS — R41842 Visuospatial deficit: Secondary | ICD-10-CM

## 2017-11-19 HISTORY — DX: Full incontinence of feces: R15.9

## 2017-11-19 NOTE — Progress Notes (Signed)
Sent to [facility].

## 2017-11-19 NOTE — Patient Instructions (Addendum)
   Vocational Rehabilitation [address] [place], [place] [address] [phone]  The Division of Vocational Rehabilitation Services provides counseling, training, education, transportation, job placement, assistive technology and other support services to people with disabilities.    **Bring your daily schedule with you to our last occupational therapy session.

## 2017-11-19 NOTE — Telephone Encounter (Signed)
Excellent

## 2017-11-19 NOTE — Telephone Encounter (Signed)
Office note updated and faxed to [facility]. Confirmation received. Justin MolaJacqueline Oralia Criger

## 2017-11-19 NOTE — Therapy (Signed)
[facility] [address] [place], [place], [address] Phone: [phone]   Fax:  [phone]  Occupational Therapy Treatment  Patient Details  Name: Justin Ball MRN: [id number] Date of Birth: 12-28-1990 Referring Provider: Dr. Rhina Brackett Dam   Encounter Date: 11/19/2017  OT End of Session - 11/19/17 1321    Visit Number  15    Number of Visits  16 11+5=16    Date for OT Re-Evaluation  12/02/17    Authorization Type  [employer]--25 visits combined hard limit; **Medicaid now current    Authorization Time Period  [employer]--25 visits combined (PT used 3+OT used 11).  3 initial visits approved by MCD through 10/27/17.  Medicaid approved 5 OT visits 11/5-12/9/18    Authorization - Visit Number  4 Medicaid visits    Authorization - Number of Visits  5    OT Start Time  5686    OT Stop Time  1404    OT Time Calculation (min)  46 min    Activity Tolerance  Patient tolerated treatment well    Behavior During Therapy  WFL for tasks assessed/performed       Past Medical History:  Diagnosis Date  . 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 Anxiety   . 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 Bipolar 1 disorder ([place])   . Chronic viral hepatitis B without coma and with delta agent ([place])   . Cognitive communication deficit    working w/ therapy  . 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 Depression   . 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 Fecal incontinence 11/19/2017  . Foley catheter in place   . 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 History of cerebral  infarction 05/20/2017   multiple ischemic infarction secondary to infection  . History of scabies 2013  . History of syphilis   . 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 Neurogenic bowel    wears depends  . Neuromuscular disorder ([place])   . 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 Seizure disorder [facility])    neurologist-  dr Leta Baptist ([facility])--caused by cryptococcal meningitis/ multiple infarctions--  no seizure since hospitalization May 2018  . Status epilepticus ([place])   . Visuospatial deficit     Past Surgical History:  Procedure Laterality Date  . CYSTOSCOPY N/A 10/10/2017   Procedure: CYSTOSCOPY;  Surgeon: Alexis Frock, MD;  Location: [facility];  Service: Urology;  Laterality: N/A;  . EXAMINATION UNDER ANESTHESIA N/A 12/28/2014   Procedure: EXAM UNDER ANESTHESIA;  Surgeon: Michael Boston, MD;  [facility];  Service: General;  Laterality: N/A;  . FLOOR OF MOUTH BIOPSY N/A 09/16/2016   Procedure: BIOPSY OF ORAL ABCESS;  Surgeon: Leta Baptist, MD;  Location: [facility];  Service: ENT;  Laterality: N/A;  . INCISION AND DRAINAGE ABSCESS N/A 09/16/2016   Procedure: INCISION AND DRAINAGE ABSCESS;  Surgeon: Leta Baptist, MD;  Location: [facility];  Service: ENT;  Laterality: N/A;  . [place]  09/ 03/ 2011  dr Marlou Starks  . INCISION AND DRAINAGE PERIRECTAL ABSCESS N/A 12/28/2014   Procedure: IRRIGATION AND DEBRIDEMENT PERIRECTAL ABSCESS;  Surgeon: Michael Boston, MD;  [facility];  Service: General;  Laterality: N/A;  Fistula repair and ablation  . INSERTION OF SUPRAPUBIC CATHETER N/A 10/10/2017   Procedure: INSERTION OF SUPRAPUBIC CATHETER;  Surgeon: Alexis Frock, MD;  [facility];  Service: Urology;  Laterality: N/A;  . LAPAROSCOPIC REVISION VENTRICULAR-PERITONEAL (V-P) SHUNT N/A 06/12/2017   Procedure: LAPAROSCOPIC REVISION VENTRICULAR-PERITONEAL  (V-P) SHUNT;  Surgeon: Kinsinger, Arta Bruce, MD;  Location: [facility];  Service: General;  Laterality: N/A;  . TRANSTHORACIC ECHOCARDIOGRAM  06/02/2017   EF 65-70%/  trivial PR  . VENTRICULOPERITONEAL SHUNT N/A 06/12/2017   Procedure: SHUNT INSERTION VENTRICULAR-PERITONEAL;  Surgeon: Consuella Lose, MD;  Location: [facility];  Service: Neurosurgery;  Laterality: N/A;    There were no vitals filed for this visit.  Subjective Assessment - 11/19/17 1320    Subjective   Pt reports that he is changing his own depends now.  Pt reports that he has been playing Trouble and Solicitor at home and they have been working on using a schedule.    Patient is accompained by:  Family member grandmother--not present during session today.    Pertinent History  Hospitalized 5/27-06/27/17 with cryptococcal meningitis/encephalitis/neurosyphillis and VP shunt placement; AIDS, Hep B, hx of syphilis, bipolar disorder, seizure hx, anemia, urinary catheter, staphylococcus epidermidis, ventriculitis, hypotension, renal insuffiency, hx of substance abuse, suprapubic cathether    Limitations  AIDS, Hep B, hx of syphilis, seizure hx, s/p VP shunt, suprapubic catheter, cognitive deficits    Patient Stated Goals  be able to take care of myself    Currently in Pain?  No/denies    Pain Onset  In the past 7 days         Performed constant therapy tasks for attention, memory, orientation, and functional cognitive tasks for ADLs/IADLs:  Sequencing (ADLs):  Level 1, 88% accuracy, with 51.29sec average response/completion time.  Clock Math:  Level 1, 67% accuracy, with 36.60sec average response/completion time.  Symbol Matching:  Level 6, 28% accuracy, with 28.53sec average response/completion time.  Picture N-back (immediate memory):  Level 1, 67% accuracy, with 29.17sec average response/completion time.   Picture Matching:  Level 2, 44% accuracy, with 97.45sec average response/completion time.   Voicemail (listening and  answering questions):  Level 1, 40% accuracy, with 13.08sec average response/completion time.   Clock Reading:  Level 1, 50% accuracy, with 20.29sec average response/completion time.  Currency (Counting):  Level 1, 90% accuracy, with 24.76sec average response/completion time.  Calendar Reading:  Level 1, 100% accuracy, with 53.67sec average response/completion time. With mod v.c./assist For directions/comprehension    Reviewed use of daily schedule and discussed progress with home maintenence tasks and BADLs.  Pt forgot to bring reading glasses and daily schedule today.  Discussed orientation with clock for time. Grandmother/caregiver not present today.  Pt verbalizes desire to return to driving.  Reviewed that pt needs to be able to care for himself (BADLs, managing schedule/simple IADLs, and improve memory, visual-perceptual skills, attention, orientation, etc)  prior to returning to driving.  Reviewed safety concerns and that pt should have driving evaluation prior to returning to driving.         OT Education - 11/19/17 1335    Education Details  [place]    Person(s) Educated  Patient    Methods  Explanation;Handout    Comprehension  Verbalized understanding  OT Short Term Goals - 11/12/17 1648      OT SHORT TERM GOAL #1   Title  --      OT SHORT TERM GOAL #2   Title  --    Time  --    Status  --      OT SHORT TERM GOAL #3   Title  --      OT SHORT TERM GOAL #4   Title  --      OT SHORT TERM GOAL #5   Title  Pt/caregiver will verbalize understanding of cognitive compensation strategies for ADLs/functional tasks to incr participation in ADLs.    Baseline  dependent    Time  5    Period  Weeks    Status  On-going Pt is starting to use a schedule, 10/03/17:  ongoing education, pt/grandma verbalized understanding of strategies.  10/16/17:  partially met as pt education ongoing and pt/grandmother have not fully implemented all  strategies yet (continue goal)        OT Long Term Goals - 11/19/17 1340      OT LONG TERM GOAL #1   Title  Pt/caregiver will be independent with updated HEP/functional tasks for home.    Baseline  dependent    Time  5    Period  Weeks    Status  On-going 10/16/17:  not fully met, needs review as pt/caregiver has not been able to implement fully at home yet due to pt cognitive deficits (continue goal)      OT LONG TERM GOAL #2   Title  Pt will perform bathing/dressing without cueing (distant supervision).    Baseline  min A, and prompts    Time  5    Period  Weeks    Status  Partially Met 10/16/17:  met for dressing, not met for bathing due precautions due to catheter (min A and cueing)--continue goal  11/19/17  not met with bathing due to precautions due to catheter      OT LONG TERM GOAL #3   Title  Pt will follow simple schedule/routine for ADLs with mod cues.    Baseline  pt needs cueing, assistance for initiation and throughout ADLs, decr attention/ability to follow directions    Time  5    Period  Weeks    Status  On-going 10/03/17:  revised goal.  10/16/17:  max cues due to decr initiation, attention, memory (continue goal)      OT LONG TERM GOAL #4   Title  --      OT LONG TERM GOAL #5   Title  --      OT LONG TERM GOAL #6   Title  Pt will perform at least 4 different home maintenance tasks consistently with supervision/min cues to initiate only.    Baseline  currently sweeping inconsistently and folding clothes only    Time  5    Period  Weeks    Status  Achieved 11/12/17:  sweeping, making bed, folding clothes 11/19/17:  also picking up trash, washing dishes, washing clothes            Plan - 11/19/17 1325    Rehab Potential  Fair    Current Impairments/barriers affecting progress:  cognitive deficits, urinary catheter, severity of deficits    OT Frequency  1x / week    OT Duration  -- for 5 weeks    OT Treatment/Interventions  Self-care/ADL  training;Therapeutic exercise;DME and/or AE instruction;Therapist, nutritional;Therapeutic activities;Patient/family education;Cognitive remediation/compensation;Neuromuscular  education;Visual/perceptual remediation/compensation;Therapeutic exercises    Plan  review written daily schedule if pt brings in; check remaining goals and anticipate d/c    Consulted and Agree with Plan of Care  Patient;Family member/caregiver    Family Member Consulted  grandmother--Sue, Mom       Patient will benefit from skilled therapeutic intervention in order to improve the following deficits and impairments:  Decreased balance, Impaired vision/preception, Decreased cognition, Decreased knowledge of precautions, Impaired perceived functional ability, Impaired UE functional use, Decreased strength, Decreased safety awareness, Decreased knowledge of use of DME, Decreased coordination, Decreased activity tolerance  Visit Diagnosis: Attention and concentration deficit  Visuospatial deficit  Other lack of coordination  Frontal lobe and executive function deficit    Problem List Patient Active Problem List   Diagnosis Date Noted  . Fecal incontinence 11/19/2017  . Seizure disorder ([place]) 09/12/2017  . Urinary retention 07/12/2017  . Acute renal insufficiency 07/12/2017  . Acute cystitis without hematuria   . Hypotension   . 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 Acute urinary retention 06/26/2017  . Staphylococcus epidermidis ventriculitis 06/26/2017  . Bacteremia due to Streptococcus pneumoniae 06/26/2017  . Bordetella pertussis 06/26/2017  . Chronic viral hepatitis B without coma and with delta agent ([place])   . Transaminitis   . Endotracheal tube present   . Status epilepticus ([place])   . History of syphilis   . Tobacco abuse   . Meningoencephalitis   . Cryptococcal meningitis ([place])   . Seizure ([place]) 05/20/2017  . Peri-rectal abscess 03/06/2017  . 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 Anemia 09/12/2016  . 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 Bipolar disorder ([place]) 04/08/2013  . Diaper rash 10/10/2012    [facility] 11/19/2017, 5:26 PM  [facility] [address] [place], [place], [address] Phone: [phone]   Fax:  [phone]  Name: Justin Ball MRN: [id number] Date of Birth: March 27, 1991   Vianne Bulls, OTR/L [facility] [address] [place], [place]  [address] [phone] phone [phone] 11/19/17 5:26 PM

## 2017-11-20 ENCOUNTER — Ambulatory Visit: Payer: Medicaid Other | Admitting: Speech Pathology

## 2017-11-21 ENCOUNTER — Telehealth: Payer: Self-pay | Admitting: *Deleted

## 2017-11-21 NOTE — Telephone Encounter (Signed)
Thanks so much Annice PihJackie for ALL of your hard work with re to [employer]

## 2017-11-21 NOTE — Telephone Encounter (Signed)
Called [facility] and spoke to April Stone-Mills to find out what more was needed for patient to receive his pull ups and catheter supplies. She has already received all that is needed from Dr. Daiva EvesVan Dam and has talked to patient's Mom. Supplies will be shipped directly to his home. Currently he does not have a home health nurse. Placed call to Molli PoseyAmbre, our outreach RN and had to leave a voice mail to see if Justin Ball would be a candidate for care.

## 2017-11-22 ENCOUNTER — Ambulatory Visit: Payer: Medicaid Other | Admitting: Speech Pathology

## 2017-11-22 ENCOUNTER — Other Ambulatory Visit: Payer: Self-pay

## 2017-11-22 ENCOUNTER — Encounter: Payer: Self-pay | Admitting: Speech Pathology

## 2017-11-22 DIAGNOSIS — R41841 Cognitive communication deficit: Secondary | ICD-10-CM

## 2017-11-22 NOTE — Therapy (Signed)
[facility] [address] [place], [place], [address] Phone: [phone]   Fax:  [phone]  Speech Language Pathology Treatment  Patient Details  Name: Justin Ball MRN: [id number] Date of Birth: 10-28-1991 Referring Provider: Tommy Medal, Roderic Scarce, MD   Encounter Date: 11/22/2017  End of Session - 11/22/17 2011    Visit Number  14    Number of Visits  20    Date for SLP Re-Evaluation  10/19/17    Authorization Time Period  medicain approved 8 visits 11/14/17 to 12/11/17 per [place] - Visit Number  3    Authorization - Number of Visits  8    SLP Start Time  1400    SLP Stop Time   1500    SLP Time Calculation (min)  60 min    Activity Tolerance  Patient tolerated treatment well       Past Medical History:  Diagnosis Date  . 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 Anxiety   . 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 Bipolar 1 disorder ([place])   . Chronic viral hepatitis B without coma and with delta agent ([place])   . Cognitive communication deficit    working w/ therapy  . 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 Depression   . 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 Fecal incontinence 11/19/2017  . Foley catheter in place   . 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 History of scabies 2013  . History of syphilis   . 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 Neurogenic bladder    secondary to damage from brain  infection  . Neurogenic bowel    wears depends  . Neuromuscular disorder ([place])   . 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 Seizure disorder [facility])    neurologist-  dr Leta Baptist ([facility])--caused by cryptococcal meningitis/ multiple infarctions--  no seizure since hospitalization May 2018  . Status epilepticus ([place])   . Visuospatial deficit     Past Surgical History:  Procedure Laterality Date  . CYSTOSCOPY N/A 10/10/2017   Procedure: CYSTOSCOPY;  Surgeon: Alexis Frock, MD;  Location: [facility];  Service: Urology;  Laterality: N/A;  . EXAMINATION UNDER ANESTHESIA N/A 12/28/2014   Procedure: EXAM UNDER ANESTHESIA;  Surgeon: Michael Boston, MD;  [facility];  Service: General;  Laterality: N/A;  . FLOOR OF MOUTH BIOPSY N/A 09/16/2016   Procedure: BIOPSY OF ORAL ABCESS;  Surgeon: Leta Baptist, MD;  Location: [facility];  Service: ENT;  Laterality: N/A;  . INCISION AND DRAINAGE ABSCESS N/A 09/16/2016   Procedure: INCISION AND DRAINAGE ABSCESS;  Surgeon: Leta Baptist, MD;  Location: [facility];  Service: ENT;  Laterality: N/A;  . [place]  09/ 03/ 2011   dr toth  . INCISION AND DRAINAGE PERIRECTAL ABSCESS N/A 12/28/2014   Procedure: IRRIGATION AND DEBRIDEMENT PERIRECTAL ABSCESS;  Surgeon: Michael Boston, MD;  [facility];  Service: General;  Laterality: N/A;  Fistula repair and ablation  .  INSERTION OF SUPRAPUBIC CATHETER N/A 10/10/2017   Procedure: INSERTION OF SUPRAPUBIC CATHETER;  Surgeon: Alexis Frock, MD;  Location: [facility];  Service: Urology;  Laterality: N/A;  . LAPAROSCOPIC REVISION VENTRICULAR-PERITONEAL (V-P) SHUNT N/A 06/12/2017   Procedure: LAPAROSCOPIC REVISION VENTRICULAR-PERITONEAL (V-P) SHUNT;  Surgeon: Kinsinger, Arta Bruce, MD;  Location: [facility];  Service: General;  Laterality: N/A;  . TRANSTHORACIC ECHOCARDIOGRAM  06/02/2017   EF 65-70%/  trivial PR  . VENTRICULOPERITONEAL SHUNT N/A 06/12/2017   Procedure: SHUNT INSERTION  VENTRICULAR-PERITONEAL;  Surgeon: Consuella Lose, MD;  Location: [facility];  Service: Neurosurgery;  Laterality: N/A;    There were no vitals filed for this visit.  Subjective Assessment - 11/22/17 2002    Subjective  "We have an aid from the agency for 3 1/2 hours a day"    Patient is accompained by:  Family member    Special Tests  Collie Siad            ADULT SLP TREATMENT - 11/22/17 2003      General Information   Behavior/Cognition  Alert;Cooperative;Distractible;Requires cueing;Pleasant mood;Confused;Decreased sustained attention      Treatment Provided   Treatment provided  Cognitive-Linquistic      Pain Assessment   Pain Assessment  No/denies pain      Cognitive-Linquistic Treatment   Treatment focused on  Cognition    Skilled Treatment  Functional math (money counting)to facilitate selective attention, with mod A, as ST and grandmother provided conversational distractions, pt required freuquent reptitions and cues for attention. Pt benefitted from [place] pt to count aloud to keep track of amounts, due to difficulty maintaining amount in head (mental math). Abstract reasoning facilitated by pt naming category of 3-4 related items in with min A, however, pt required usual mod A to add 2-3 items to the caategory. [name] would like pt more "conversant" Trained pt briefly on conversation starters, add a thought and ask a you question with mod A.       Assessment / Recommendations / Plan   Plan  Continue with current plan of care      Progression Toward Goals   Progression toward goals  Progressing toward goals         SLP Short Term Goals - 11/22/17 2011      SLP SHORT TERM GOAL #1   Title  pt will demo sustained attention for 5 minutes, in 3 different tasks completed 90% success, in 3 sessions    Status  Not Met      SLP SHORT TERM GOAL #2   Title  pt will tell SLP 2 cognitive defict areas with min A occasionallly, in three sessions    Status  Not Met      SLP  SHORT TERM GOAL #3   Title  pt will demo orientation to year, month, date with modified independence (calendar)    Status  Not Met      SLP SHORT TERM GOAL #4   Title  pt will demo sustained attention to one task for 10 minutes to achieve 90% success    Status  Not Met       SLP Long Term Goals - 11/22/17 2011      SLP LONG TERM GOAL #1   Title  pt will demonstrate sustained attention to one task for 15 mintues in order to achieve 90% success with rare nonverbal cues back to task, in two sessions    Baseline  7 minutes (11-05-17)    Time  2    Period  Weeks or 20 visits    Status  On-going      SLP LONG TERM GOAL #2   Title  pt will demo emergent awareness in a simple paper/pencil cognitive linguistic task 60% of the time given pt double checking responses    Baseline  <20% of the time (11-05-17)    Time  2    Period  Weeks or 20 visits    Status  On-going      SLP LONG TERM GOAL #3   Title  pt will demo orientation to month, day, date with modified independence over 8 sessions    Baseline  one session (11-05-17)    Time  2    Period  Weeks or 20 visits    Status  On-going       Plan - 11/22/17 2011    Clinical Impression Statement  Pt continues to require variable but consistent SLP A for simple problem solving, organization, memory and orientation. Pt would benefit most from a consistent program of care, such as a day program. Pt continues to require 24 hour care due to cognitive impairments. Continue skilled ST to maximize cognitive lingusitic skills for improved safety and to reduce burden of care.     Speech Therapy Frequency  2x / week    Treatment/Interventions  Language facilitation;Compensatory techniques;Internal/external aids;SLP instruction and feedback;Cognitive reorganization;Environmental controls;Multimodal communcation approach;Functional tasks;Cueing hierarchy;Patient/family education    Potential Considerations  Severity of  impairments;Cooperation/participation level;Family/community support;Ability to learn/carryover information       Patient will benefit from skilled therapeutic intervention in order to improve the following deficits and impairments:   Cognitive communication deficit    Problem List Patient Active Problem List   Diagnosis Date Noted  . Fecal incontinence 11/19/2017  . Seizure disorder ([place]) 09/12/2017  . Urinary retention 07/12/2017  . Acute renal insufficiency 07/12/2017  . Acute cystitis without hematuria   . Hypotension   . 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 Acute urinary retention 06/26/2017  . Staphylococcus epidermidis ventriculitis 06/26/2017  . Bacteremia due to Streptococcus pneumoniae 06/26/2017  . Bordetella pertussis 06/26/2017  . Chronic viral hepatitis B without coma and with delta agent ([place])   . Transaminitis   . Endotracheal tube present   . Status epilepticus ([place])   . History of syphilis   . Tobacco abuse   . Meningoencephalitis   . Cryptococcal meningitis ([name])   . Seizure ([place]) 05/20/2017  . Peri-rectal abscess 03/06/2017  . 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 Anemia 09/12/2016  . 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 Bipolar disorder ([place]) 04/08/2013  . Diaper rash 10/10/2012    , Annye Rusk MS, CCC-SLP 11/22/2017, 8:14 PM  [facility] [address] [place], [place], [address] Phone: [phone]   Fax:  [phone]   Name: PATON CRUM MRN: [id number] Date of Birth: [birth year]

## 2017-11-26 ENCOUNTER — Encounter: Payer: Self-pay | Admitting: Occupational Therapy

## 2017-11-26 ENCOUNTER — Ambulatory Visit: Payer: No Typology Code available for payment source | Admitting: Infectious Disease

## 2017-11-26 ENCOUNTER — Ambulatory Visit: Payer: Medicaid Other | Attending: Internal Medicine | Admitting: Occupational Therapy

## 2017-11-26 DIAGNOSIS — R41842 Visuospatial deficit: Secondary | ICD-10-CM | POA: Diagnosis present

## 2017-11-26 DIAGNOSIS — R41841 Cognitive communication deficit: Secondary | ICD-10-CM | POA: Diagnosis present

## 2017-11-26 DIAGNOSIS — R41840 Attention and concentration deficit: Secondary | ICD-10-CM | POA: Diagnosis present

## 2017-11-26 DIAGNOSIS — R41844 Frontal lobe and executive function deficit: Secondary | ICD-10-CM | POA: Insufficient documentation

## 2017-11-26 NOTE — Therapy (Signed)
[facility] [address] [place], [place], [address] Phone: [phone]   Fax:  [phone]  Occupational Therapy Treatment  Patient Details  Name: Justin Ball MRN: [id number] Date of Birth: 24-Jun-1991 Referring Provider: Dr. Rhina Brackett Dam   Encounter Date: 11/26/2017  OT End of Session - 11/26/17 1323    Visit Number  16    Number of Visits  16 11+5=16    Date for OT Re-Evaluation  12/02/17    Authorization Type  Aetna--25 visits combined hard limit; **Medicaid now current    Authorization Time Period  A[employer]na--25 visits combined (PT used 3+OT used 11).  3 initial visits approved by MCD through 10/27/17.  Medicaid approved 5 OT visits 11/5-12/9/18    Authorization - Visit Number  5 Medicaid visits    Authorization - Number of Visits  5    OT Start Time  9622    OT Stop Time  1400    OT Time Calculation (min)  43 min    Activity Tolerance  Patient tolerated treatment well    Behavior During Therapy  WFL for tasks assessed/performed       Past Medical History:  Diagnosis Date  . 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 Anxiety   . 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 Bipolar 1 disorder ([place])   . Chronic viral hepatitis B without coma and with delta agent ([place])   . Cognitive communication deficit    working w/ therapy  . 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 Depression   . 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 Fecal incontinence 11/19/2017  . Foley catheter in place   . 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 History of cerebral infarction  05/20/2017   multiple ischemic infarction secondary to infection  . History of scabies 2013  . History of syphilis   . 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 Neurogenic bowel    wears depends  . Neuromuscular disorder ([place])   . 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 Seizure disorder [facility])    neurologist-  dr Leta Baptist ([facility])--caused by cryptococcal meningitis/ multiple infarctions--  no seizure since hospitalization May 2018  . Status epilepticus ([place])   . Visuospatial deficit     Past Surgical History:  Procedure Laterality Date  . CYSTOSCOPY N/A 10/10/2017   Procedure: CYSTOSCOPY;  Surgeon: Alexis Frock, MD;  Location: [facility];  Service: Urology;  Laterality: N/A;  . EXAMINATION UNDER ANESTHESIA N/A 12/28/2014   Procedure: EXAM UNDER ANESTHESIA;  Surgeon: Michael Boston, MD;  Location: [facility];  Service: General;  Laterality: N/A;  . FLOOR OF MOUTH BIOPSY N/A 09/16/2016   Procedure: BIOPSY OF ORAL ABCESS;  Surgeon: Leta Baptist, MD;  Location: [facility];  Service: ENT;  Laterality: N/A;  . INCISION AND DRAINAGE ABSCESS N/A 09/16/2016   Procedure: INCISION AND DRAINAGE ABSCESS;  Surgeon: Leta Baptist, MD;  Location: [facility];  Service: ENT;  Laterality: N/A;  . [place]  09/ 03/ 2011  dr Marlou Starks  . INCISION AND DRAINAGE PERIRECTAL ABSCESS N/A 12/28/2014   Procedure: IRRIGATION AND DEBRIDEMENT PERIRECTAL ABSCESS;  Surgeon: Michael Boston, MD;  [facility];  Service: General;  Laterality: N/A;  Fistula repair and ablation  . INSERTION OF SUPRAPUBIC CATHETER N/A 10/10/2017   Procedure: INSERTION OF SUPRAPUBIC CATHETER;  Surgeon: Alexis Frock, MD;  Location: [facility];  Service: Urology;  Laterality: N/A;  . LAPAROSCOPIC REVISION VENTRICULAR-PERITONEAL (V-P) SHUNT N/A 06/12/2017   Procedure: LAPAROSCOPIC REVISION VENTRICULAR-PERITONEAL (V-P)  SHUNT;  Surgeon: Kinsinger, Arta Bruce, MD;  Location: [facility];  Service: General;  Laterality: N/A;  . TRANSTHORACIC ECHOCARDIOGRAM  06/02/2017   EF 65-70%/  trivial PR  . VENTRICULOPERITONEAL SHUNT N/A 06/12/2017   Procedure: SHUNT INSERTION VENTRICULAR-PERITONEAL;  Surgeon: Consuella Lose, MD;  Location: [facility];  Service: Neurosurgery;  Laterality: N/A;    There were no vitals filed for this visit.  Subjective Assessment - 11/26/17 1319    Subjective   We went to the day program, but he feels likes that he is doing better and he doesn't fit in.  Also aide came and may be there 3hr/day.      Patient is accompained by:  Family member grandmother    Pertinent History  Hospitalized 5/27-06/27/17 with cryptococcal meningitis/encephalitis/neurosyphillis and VP shunt placement; AIDS, Hep B, hx of syphilis, bipolar disorder, seizure hx, anemia, urinary catheter, staphylococcus epidermidis, ventriculitis, hypotension, renal insuffiency, hx of substance abuse, suprapubic cathether    Limitations  AIDS, Hep B, hx of syphilis, seizure hx, s/p VP shunt, suprapubic catheter, cognitive deficits    Patient Stated Goals  be able to take care of myself    Currently in Pain?  No/denies        Long discussion regarding progress and next steps to work on including requirements prior to performing financial management independently, work, or driving.  Reviewed purpose and importance of using schedule/routine and managing self-care and simple IADLs (cleaning, meal prep) prior to higher-level IADLs.  Encouraged pt to participate in day program if able and possible benefits (have visited one and are considering).  Recommended day program, then Vocational Rehab after day program is going well.  Reviewed recommendation that pt should have driving evaluation prior to returning to driving (very long-term) as pt is not appropriate now. Grandmother also reports that pt may have aide starting soon.  Emphasized that pt should  have continued supervision.  Discussed d/c from OT and pt/grandmother in agreement.  Orientation to time, day/date/month with mod cueing.  Discussed progress with daily schedule (did not bring in written schedule).    Performed constant therapy tasks for attention, memory, orientation, and functional cognitive tasks for ADLs/IADLs:  Sequencing (ADLs):  Level 1, 78% accuracy, with 37.84sec average response/completion time.  Clock Math:  Level 1, 30% accuracy, with 19.33sec average response/completion time.   Clock Reading:  Level 2, 80% accuracy, with 18.07sec average response/completion time.       OT Short Term Goals - 11/26/17 1352      OT SHORT TERM GOAL #1   Title  --      OT SHORT TERM GOAL #2   Title  --      OT SHORT TERM GOAL #3   Title  --      OT SHORT TERM GOAL #4   Title  --      OT SHORT TERM GOAL #5   Title  Pt/caregiver will verbalize understanding of cognitive compensation strategies for ADLs/functional tasks to incr  participation in ADLs.    Baseline  dependent    Time  5    Period  Weeks    Status  Achieved Pt is starting to use a schedule, 10/03/17:  ongoing education, pt/grandma verbalized understanding of strategies.  10/16/17:  partially met as pt education ongoing and pt/grandmother have not fully implemented all strategies yet (continue goal)        OT Long Term Goals - 11/26/17 1352      OT LONG TERM GOAL #1   Title  Pt/caregiver will be independent with updated HEP/functional tasks for home.    Baseline  dependent    Time  5    Period  Weeks    Status  Achieved 10/16/17:  not fully met, needs review as pt/caregiver has not been able to implement fully at home yet due to pt cognitive deficits (continue goal)      OT LONG TERM GOAL #2   Title  Pt will perform bathing/dressing without cueing (distant supervision).    Baseline  min A, and prompts    Time  5    Period  Weeks    Status  Partially Met 10/16/17:  met for dressing, not met for  bathing due precautions due to catheter (min A and cueing)--continue goal  11/19/17  not met with bathing due to precautions due to catheter, min A      OT LONG TERM GOAL #3   Title  Pt will follow simple schedule/routine for ADLs with mod cues.    Baseline  pt needs cueing, assistance for initiation and throughout ADLs, decr attention/ability to follow directions    Time  5    Period  Weeks    Status  Achieved 10/03/17:  revised goal.  10/16/17:  max cues due to decr initiation, attention, memory (continue goal).  11/26/17  met at approx this level per caregiver      OT LONG TERM GOAL #4   Title  --      OT LONG TERM GOAL #5   Title  --      OT LONG TERM GOAL #6   Title  Pt will perform at least 4 different home maintenance tasks consistently with supervision/min cues to initiate only.    Baseline  currently sweeping inconsistently and folding clothes only    Time  5    Period  Weeks    Status  Achieved 11/12/17:  sweeping, making bed, folding clothes 11/19/17:  also picking up trash, washing dishes, washing clothes            Plan - 11/26/17 1728    Clinical Impression Statement  Pt has made some progress with cognition for ADLs/IADLs, but continues to demo significant deficits.  Recommended pt/family continue to provide supervision/structure with focus on following schedule for orientation, memory, attention, and ADL performance.  Pt may have aide strarting soon.  Pt may also be able to go to day program.  Recommended day program as it will provide structure, consistency for pt.  Also recommended Vocational Rehab long term.  Pt/grandmother agree with d/c.    Rehab Potential  Fair    Current Impairments/barriers affecting progress:  cognitive deficits, urinary catheter, severity of deficits    OT Frequency  1x / week    OT Duration  -- for 5 weeks    Plan  d/c OT    Consulted and Agree with Plan of Care  Patient;Family member/caregiver    Family Member Consulted   grandmother--Sue,  Mom       Patient will benefit from skilled therapeutic intervention in order to improve the following deficits and impairments:  Decreased balance, Impaired vision/preception, Decreased cognition, Decreased knowledge of precautions, Impaired perceived functional ability, Impaired UE functional use, Decreased strength, Decreased safety awareness, Decreased knowledge of use of DME, Decreased coordination, Decreased activity tolerance  Visit Diagnosis: Attention and concentration deficit  Visuospatial deficit  Frontal lobe and executive function deficit    Problem List Patient Active Problem List   Diagnosis Date Noted  . Fecal incontinence 11/19/2017  . Seizure disorder ([place]) 09/12/2017  . Urinary retention 07/12/2017  . Acute renal insufficiency 07/12/2017  . Acute cystitis without hematuria   . Hypotension   . 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 Acute urinary retention 06/26/2017  . Staphylococcus epidermidis ventriculitis 06/26/2017  . Bacteremia due to Streptococcus pneumoniae 06/26/2017  . Bordetella pertussis 06/26/2017  . Chronic viral hepatitis B without coma and with delta agent ([place])   . Transaminitis   . Endotracheal tube present   . Status epilepticus ([place])   . History of syphilis   . Tobacco abuse   . Meningoencephalitis   . Cryptococcal meningitis ([place])   . Seizure ([place]) 05/20/2017  . Peri-rectal abscess 03/06/2017  . 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 Anemia 09/12/2016  . 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 Bipolar disorder ([place]) 04/08/2013  . Diaper rash 10/10/2012   OCCUPATIONAL THERAPY DISCHARGE SUMMARY  Visits from Start of Care: 16  Current functional level related to goals / functional outcomes:  See above  Remaining deficits:   Cognitive deficits (including decr memory, decr problem solving/planning, decr attention, decr orientation, decr  initiation), visual-perceptual defictis    Education / Equipment: Pt/caregiver instructed in compensation strategies for ADLs/IADLs, activities/HEP for home, community resources (Landscape architect, Recreational Opportunities) Plan: Patient agrees to discharge.  Patient goals were partially met. Patient is being discharged due to                                                     Reaching maximal rehab potential at this time.?????        [facility] 11/26/2017, 5:31 PM  [facility] [address] [place], [place], [address] Phone: [phone]   Fax:  [phone]  Name: DAEGON DEISS MRN: [id number] Date of Birth: 10-Jul-1991   Vianne Bulls, OTR/L [facility] [address] [place], [place]  [address] [phone] phone [phone] 11/26/17 5:31 PM

## 2017-11-27 ENCOUNTER — Ambulatory Visit: Payer: Medicaid Other | Admitting: Speech Pathology

## 2017-11-27 ENCOUNTER — Encounter: Payer: Self-pay | Admitting: Speech Pathology

## 2017-11-27 ENCOUNTER — Other Ambulatory Visit: Payer: Self-pay

## 2017-11-27 DIAGNOSIS — R41840 Attention and concentration deficit: Secondary | ICD-10-CM | POA: Diagnosis not present

## 2017-11-27 DIAGNOSIS — R41841 Cognitive communication deficit: Secondary | ICD-10-CM

## 2017-11-27 NOTE — Therapy (Signed)
[facility] [address] [place], [place], [address] Phone: [phone]   Fax:  [phone]  Speech Language Pathology Treatment  Patient Details  Name: Justin Ball MRN: [id number] Date of Birth: 24-Oct-1991 Referring Provider: Tommy Medal, Roderic Scarce, MD   Encounter Date: 11/27/2017  End of Session - 11/27/17 1516    Visit Number  15    Number of Visits  20    Date for SLP Re-Evaluation  10/19/17    Authorization Type  AETNA - 25 visits combined - HARD LIMIT    Authorization Time Period  medicain approved 8 visits 11/14/17 to 12/11/17 per [place] - Visit Number  4    SLP Start Time  5537    SLP Stop Time   1401    SLP Time Calculation (min)  43 min    Activity Tolerance  Patient tolerated treatment well       Past Medical History:  Diagnosis Date  . 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 Anxiety   . 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 Bipolar 1 disorder ([place])   . Chronic viral hepatitis B without coma and with delta agent ([place])   . Cognitive communication deficit    working w/ therapy  . 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 Depression   . 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 Fecal incontinence 11/19/2017  . Foley catheter in place   . 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 History of scabies 2013  . History of syphilis   . 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 Neurogenic bladder    secondary to  damage from brain infection  . Neurogenic bowel    wears depends  . Neuromuscular disorder ([place])   . 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 Seizure disorder [facility])    neurologist-  dr Leta Baptist ([facility])--caused by cryptococcal meningitis/ multiple infarctions--  no seizure since hospitalization May 2018  . Status epilepticus ([place])   . Visuospatial deficit     Past Surgical History:  Procedure Laterality Date  . CYSTOSCOPY N/A 10/10/2017   Procedure: CYSTOSCOPY;  Surgeon: Alexis Frock, MD;  Location: [facility];  Service: Urology;  Laterality: N/A;  . EXAMINATION UNDER ANESTHESIA N/A 12/28/2014   Procedure: EXAM UNDER ANESTHESIA;  Surgeon: Michael Boston, MD;  [facility];  Service: General;  Laterality: N/A;  . FLOOR OF MOUTH BIOPSY N/A 09/16/2016   Procedure: BIOPSY OF ORAL ABCESS;  Surgeon: Leta Baptist, MD;  Location: [facility];  Service: ENT;  Laterality: N/A;  . INCISION AND DRAINAGE ABSCESS N/A 09/16/2016   Procedure: INCISION AND DRAINAGE ABSCESS;  Surgeon: Leta Baptist, MD;  Location: [facility];  Service: ENT;  Laterality: N/A;  . [place]  09/ 03/ 2011   dr toth  . INCISION AND DRAINAGE PERIRECTAL ABSCESS N/A 12/28/2014   Procedure: IRRIGATION AND DEBRIDEMENT PERIRECTAL ABSCESS;  Surgeon: Michael Boston, MD;  [facility];  Service: General;  Laterality: N/A;  Fistula repair  and ablation  . INSERTION OF SUPRAPUBIC CATHETER N/A 10/10/2017   Procedure: INSERTION OF SUPRAPUBIC CATHETER;  Surgeon: Alexis Frock, MD;  [facility];  Service: Urology;  Laterality: N/A;  . LAPAROSCOPIC REVISION VENTRICULAR-PERITONEAL (V-P) SHUNT N/A 06/12/2017   Procedure: LAPAROSCOPIC REVISION VENTRICULAR-PERITONEAL (V-P) SHUNT;  Surgeon: Kinsinger, Arta Bruce, MD;  Location: [facility];  Service: General;  Laterality: N/A;  . TRANSTHORACIC ECHOCARDIOGRAM  06/02/2017   EF 65-70%/  trivial PR  . VENTRICULOPERITONEAL SHUNT N/A 06/12/2017   Procedure:  SHUNT INSERTION VENTRICULAR-PERITONEAL;  Surgeon: Consuella Lose, MD;  Location: [facility];  Service: Neurosurgery;  Laterality: N/A;    There were no vitals filed for this visit.  Subjective Assessment - 11/27/17 1324    Subjective  "I've been cooking fried chicken and corn bread"    Currently in Pain?  No/denies            ADULT SLP TREATMENT - 11/27/17 1327      General Information   Behavior/Cognition  Alert;Cooperative;Distractible;Requires cueing;Pleasant mood;Confused;Decreased sustained attention      Treatment Provided   Treatment provided  Cognitive-Linquistic      Pain Assessment   Pain Assessment  No/denies pain      Cognitive-Linquistic Treatment   Treatment focused on  Cognition    Skilled Treatment  Pt read clock correctly for 1st time today. Pt much more conversant. Pt verbalized detailed sequencing for cooking instructions of several meals.  Pt attended to cognitive linguistic task in distracting environment on functional list making (grocery categories) for 20 minutes with rare min A. Pt demonstrated emergent awareness, IDing spelling errors 65% of the time, however he required min A to correct errors.  Pt more animated today. Pt alternated attention from card sort to [place] conversation with occasional min A. Pt utilized phone with min cues to tell date and day.       Assessment / Recommendations / Plan   Plan  Continue with current plan of care      Progression Toward Goals   Progression toward goals  Progressing toward goals         SLP Short Term Goals - 11/27/17 1513      SLP SHORT TERM GOAL #1   Title  pt will demo sustained attention for 5 minutes, in 3 different tasks completed 90% success, in 3 sessions    Status  Not Met      SLP SHORT TERM GOAL #2   Title  pt will tell SLP 2 cognitive defict areas with min A occasionallly, in three sessions    Status  Not Met      SLP SHORT TERM GOAL #3   Title  pt will demo orientation to year, month, date  with modified independence (calendar)    Status  Not Met      SLP SHORT TERM GOAL #4   Title  pt will demo sustained attention to one task for 10 minutes to achieve 90% success    Status  Not Met       SLP Long Term Goals - 11/27/17 1513      SLP LONG TERM GOAL #1   Title  pt will demonstrate sustained attention to one task for 15 mintues in order to achieve 90% success with rare nonverbal cues back to task, in two sessions    Baseline  7 minutes (11-05-17); 20 minutes 11/27/17    Time  2    Period  Weeks or 20 visits  Status  Achieved      SLP LONG TERM GOAL #2   Title  pt will demo emergent awareness in a simple paper/pencil cognitive linguistic task 60% of the time given pt double checking responses    Baseline  <20% of the time (11-05-17);   11/27/17    Time  2    Period  Weeks or 20 visits    Status  On-going      SLP LONG TERM GOAL #3   Title  pt will demo orientation to month, day, date with modified independence over 8 sessions    Baseline  one session (11-05-17), 11/27/17    Time  2    Period  Weeks or 20 visits    Status  On-going       Plan - 11/27/17 1510    Clinical Impression Statement  Pt with improved attention and processing speed today. Much more conversant, initiating questions and conversation. He reports supervised cooking with success. Pt continues to require 24 hour care and would benefit from a consistent day program.  Continue skilled ST to maximize cognition for safety and to reduce caregiver burden.    Speech Therapy Frequency  2x / week    Treatment/Interventions  Language facilitation;Compensatory techniques;Internal/external aids;SLP instruction and feedback;Cognitive reorganization;Environmental controls;Multimodal communcation approach;Functional tasks;Cueing hierarchy;Patient/family education    Potential to Achieve Goals  Fair    Potential Considerations  Severity of impairments;Cooperation/participation level;Family/community support;Ability to  learn/carryover information    Consulted and Agree with Plan of Care  Patient       Patient will benefit from skilled therapeutic intervention in order to improve the following deficits and impairments:   Cognitive communication deficit    Problem List Patient Active Problem List   Diagnosis Date Noted  . Fecal incontinence 11/19/2017  . Seizure disorder ([place]) 09/12/2017  . Urinary retention 07/12/2017  . Acute renal insufficiency 07/12/2017  . Acute cystitis without hematuria   . Hypotension   . 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 Acute urinary retention 06/26/2017  . Staphylococcus epidermidis ventriculitis 06/26/2017  . Bacteremia due to Streptococcus pneumoniae 06/26/2017  . Bordetella pertussis 06/26/2017  . 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 Transaminitis   . Endotracheal tube present   . Status epilepticus ([place])   . History of syphilis   . Tobacco abuse   . Meningoencephalitis   . Cryptococcal meningitis ([place])   . Seizure ([place]) 05/20/2017  . Peri-rectal abscess 03/06/2017  . 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 Anemia 09/12/2016  . 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 Bipolar disorder ([place]) 04/08/2013  . Diaper rash 10/10/2012    Nahuel Wilbert, Annye Rusk MS, CCC-SLP 11/27/2017, 3:17 PM  [facility] [address] [place], [place], [address] Phone: [phone]   Fax:  [phone]   Name: PAOLO OKANE MRN: [id number] Date of Birth: May 08, 1991

## 2017-11-28 MED FILL — SULFAMETHOXAZOLE-TMP DS TAB: 800-160 | 28 days supply | Qty: 12 | Fill #1

## 2017-11-29 ENCOUNTER — Ambulatory Visit: Payer: Medicaid Other | Admitting: Speech Pathology

## 2017-11-29 ENCOUNTER — Other Ambulatory Visit: Payer: Self-pay

## 2017-11-29 DIAGNOSIS — R41840 Attention and concentration deficit: Secondary | ICD-10-CM | POA: Diagnosis not present

## 2017-11-29 DIAGNOSIS — R41841 Cognitive communication deficit: Secondary | ICD-10-CM

## 2017-11-29 NOTE — Therapy (Signed)
[facility] [address] [place], [place], [address] Phone: [phone]   Fax:  [phone]  Speech Language Pathology Treatment  Patient Details  Name: Justin Ball MRN: [id number] Date of Birth: [birth year] Referring Provider: Tommy Medal, Roderic Scarce, MD   Encounter Date: 11/29/2017  End of Session - 11/29/17 1516    Visit Number  16    Number of Visits  20    Date for SLP Re-Evaluation  10/19/17    Authorization Time Period  medicain approved 8 visits 11/14/17 to 12/11/17 per [place] - Visit Number  5    Authorization - Number of Visits  8    SLP Start Time  8333    SLP Stop Time   1356    SLP Time Calculation (min)  40 min    Activity Tolerance  Patient tolerated treatment well       Past Medical History:  Diagnosis Date  . 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 Anxiety   . 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 Bipolar 1 disorder ([place])   . Chronic viral hepatitis B without coma and with delta agent ([place])   . Cognitive communication deficit    working w/ therapy  . 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 Depression   . 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 Fecal incontinence 11/19/2017  . Foley catheter in place   . 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 History of scabies 2013  . History of syphilis   . 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 Neurogenic bladder    secondary to damage from brain  infection  . Neurogenic bowel    wears depends  . Neuromuscular disorder ([place])   . 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 Seizure disorder [facility])    neurologist-  dr Leta Baptist ([facility])--caused by cryptococcal meningitis/ multiple infarctions--  no seizure since hospitalization May 2018  . Status epilepticus ([place])   . Visuospatial deficit     Past Surgical History:  Procedure Laterality Date  . CYSTOSCOPY N/A 10/10/2017   Procedure: CYSTOSCOPY;  Surgeon: Alexis Frock, MD;  Location: [facility];  Service: Urology;  Laterality: N/A;  . EXAMINATION UNDER ANESTHESIA N/A 12/28/2014   Procedure: EXAM UNDER ANESTHESIA;  Surgeon: Michael Boston, MD;  Location: [facility];  Service: General;  Laterality: N/A;  . FLOOR OF MOUTH BIOPSY N/A 09/16/2016   Procedure: BIOPSY OF ORAL ABCESS;  Surgeon: Leta Baptist, MD;  Location: [place];  Service: ENT;  Laterality: N/A;  . INCISION AND DRAINAGE ABSCESS N/A 09/16/2016   Procedure: INCISION AND DRAINAGE ABSCESS;  Surgeon: Leta Baptist, MD;  Location: [place];  Service: ENT;  Laterality: N/A;  . [place]  09/ 03/ 2011   dr toth  . INCISION AND DRAINAGE PERIRECTAL ABSCESS N/A 12/28/2014   Procedure: IRRIGATION AND DEBRIDEMENT PERIRECTAL ABSCESS;  Surgeon: Michael Boston, MD;  [facility];  Service: General;  Laterality: N/A;  Fistula repair and ablation  .  INSERTION OF SUPRAPUBIC CATHETER N/A 10/10/2017   Procedure: INSERTION OF SUPRAPUBIC CATHETER;  Surgeon: Alexis Frock, MD;  Location: [facility];  Service: Urology;  Laterality: N/A;  . LAPAROSCOPIC REVISION VENTRICULAR-PERITONEAL (V-P) SHUNT N/A 06/12/2017   Procedure: LAPAROSCOPIC REVISION VENTRICULAR-PERITONEAL (V-P) SHUNT;  Surgeon: Kinsinger, Arta Bruce, MD;  Location: [place];  Service: General;  Laterality: N/A;  . TRANSTHORACIC ECHOCARDIOGRAM  06/02/2017   EF 65-70%/  trivial PR  . VENTRICULOPERITONEAL SHUNT N/A 06/12/2017   Procedure: SHUNT INSERTION  VENTRICULAR-PERITONEAL;  Surgeon: Consuella Lose, MD;  Location: [place];  Service: Neurosurgery;  Laterality: N/A;    There were no vitals filed for this visit.  Subjective Assessment - 11/29/17 1327    Subjective  "I went to the day program, I didn't like it - I thought I was retarted"    Currently in Pain?  No/denies            ADULT SLP TREATMENT - 11/29/17 1334      General Information   Behavior/Cognition  Alert;Cooperative;Distractible;Requires cueing;Pleasant mood;Confused;Decreased sustained attention      Treatment Provided   Treatment provided  Cognitive-Linquistic      Pain Assessment   Pain Assessment  No/denies pain      Cognitive-Linquistic Treatment   Treatment focused on  Cognition    Skilled Treatment  Facilitaed selective attention with functional mental math (counting money) in mildly distracting environment - pt required occasional verbal and written cues for math, however did not require cues to re-attend to task with distractions. Pt required frequent mod cues for error awareness on this task and on writing items in category (lists). Pt required min A to utilize phone to be oriented to date. Today he was oriented to month and day with mod I.       Assessment / Recommendations / Plan   Plan  Continue with current plan of care      Progression Toward Goals   Progression toward goals  Progressing toward goals         SLP Short Term Goals - 11/29/17 1515      SLP SHORT TERM GOAL #1   Title  pt will demo sustained attention for 5 minutes, in 3 different tasks completed 90% success, in 3 sessions    Status  Not Met      SLP SHORT TERM GOAL #2   Title  pt will tell SLP 2 cognitive defict areas with min A occasionallly, in three sessions    Status  Not Met      SLP SHORT TERM GOAL #3   Title  pt will demo orientation to year, month, date with modified independence (calendar)    Status  Not Met      SLP SHORT TERM GOAL #4   Title  pt will demo  sustained attention to one task for 10 minutes to achieve 90% success    Status  Not Met       SLP Long Term Goals - 11/29/17 1515      SLP LONG TERM GOAL #1   Title  pt will demonstrate sustained attention to one task for 15 mintues in order to achieve 90% success with rare nonverbal cues back to task, in two sessions    Baseline  7 minutes (11-05-17); 20 minutes 11/27/17    Time  2    Period  Weeks or 20 visits    Status  Achieved      SLP LONG TERM GOAL #2  Title  pt will demo emergent awareness in a simple paper/pencil cognitive linguistic task 60% of the time given pt double checking responses    Baseline  <20% of the time (11-05-17);   11/27/17    Time  1    Period  Weeks or 20 visits    Status  On-going      SLP LONG TERM GOAL #3   Title  pt will demo orientation to month, day, date with modified independence over 8 sessions    Baseline  one session (11-05-17), 11/27/17, 11/29/17;    Time  1    Period  Weeks or 20 visits    Status  On-going       Plan - 11/29/17 1514    Clinical Impression Statement  Pt with improved attention and processing speed today. Much more conversant, initiating questions and conversation. He reports supervised cooking with success. Pt continues to require 24 hour care and would benefit from a consistent day program.  Continue skilled ST to maximize cognition for safety and to reduce caregiver burden.    Speech Therapy Frequency  2x / week    Treatment/Interventions  Language facilitation;Compensatory techniques;Internal/external aids;SLP instruction and feedback;Cognitive reorganization;Environmental controls;Multimodal communcation approach;Functional tasks;Cueing hierarchy;Patient/family education    Potential Considerations  Severity of impairments;Cooperation/participation level;Family/community support;Ability to learn/carryover information       Patient will benefit from skilled therapeutic intervention in order to improve the following  deficits and impairments:   Cognitive communication deficit    Problem List Patient Active Problem List   Diagnosis Date Noted  . Fecal incontinence 11/19/2017  . Seizure disorder ([place]) 09/12/2017  . Urinary retention 07/12/2017  . Acute renal insufficiency 07/12/2017  . Acute cystitis without hematuria   . Hypotension   . 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 Acute urinary retention 06/26/2017  . Staphylococcus epidermidis ventriculitis 06/26/2017  . Bacteremia due to Streptococcus pneumoniae 06/26/2017  . Bordetella pertussis 06/26/2017  . Chronic viral hepatitis B without coma and with delta agent ([place])   . Transaminitis   . Endotracheal tube present   . Status epilepticus ([place])   . History of syphilis   . Tobacco abuse   . Meningoencephalitis   . Cryptococcal meningitis ([place])   . Seizure ([place]) 05/20/2017  . Peri-rectal abscess 03/06/2017  . 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 Anemia 09/12/2016  . 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 Bipolar disorder ([place]) 04/08/2013  . Diaper rash 10/10/2012    Lovvorn, Annye Rusk MS, CCC-SLP 11/29/2017, 3:17 PM  [facility] [address] [place], [place], [address] Phone: [phone]   Fax:  [phone]   Name: Justin Ball MRN: [id number] Date of Birth: 03-07-1991

## 2017-12-04 ENCOUNTER — Other Ambulatory Visit: Payer: Self-pay

## 2017-12-04 ENCOUNTER — Telehealth: Payer: Self-pay | Admitting: Pharmacist Clinician (PhC)/ Clinical Pharmacy Specialist

## 2017-12-04 ENCOUNTER — Ambulatory Visit: Payer: Medicaid Other

## 2017-12-04 DIAGNOSIS — R41840 Attention and concentration deficit: Secondary | ICD-10-CM | POA: Diagnosis not present

## 2017-12-04 DIAGNOSIS — R41841 Cognitive communication deficit: Secondary | ICD-10-CM

## 2017-12-04 MED FILL — FLUCONAZOLE 200 MG TAB: 200 | 30 days supply | Qty: 60 | Fill #1

## 2017-12-04 MED FILL — QUETIAPINE 25 MG TABLET: 25 | 30 days supply | Qty: 60 | Fill #2

## 2017-12-04 NOTE — Therapy (Signed)
[facility] [address] [place], [place], [address] Phone: [phone]   Fax:  [phone]  Speech Language Pathology Treatment  Patient Details  Name: Justin Ball MRN: [id number] Date of Birth: [birth year] Referring Provider: Tommy Medal, Roderic Scarce, MD   Encounter Date: 12/04/2017  End of Session - 12/04/17 1647    Visit Number  17    Number of Visits  20    Date for SLP Re-Evaluation  10/19/17    Authorization - Visit Number  6    Authorization - Number of Visits  8    SLP Start Time  4431 pt 10 minutes late    SLP Stop Time   5400    SLP Time Calculation (min)  34 min       Past Medical History:  Diagnosis Date  . 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 Anxiety   . 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 Bipolar 1 disorder ([place])   . Chronic viral hepatitis B without coma and with delta agent ([place])   . Cognitive communication deficit    working w/ therapy  . 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 Depression   . 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 Fecal incontinence 11/19/2017  . Foley catheter in place   . 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 History of scabies 2013  . History of syphilis   . 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 Neurogenic bowel    wears depends  . Neuromuscular disorder ([place])   . 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 Seizure disorder  [facility])    neurologist-  dr Leta Baptist ([facility])--caused by cryptococcal meningitis/ multiple infarctions--  no seizure since hospitalization May 2018  . Status epilepticus ([place])   . Visuospatial deficit     Past Surgical History:  Procedure Laterality Date  . CYSTOSCOPY N/A 10/10/2017   Procedure: CYSTOSCOPY;  Surgeon: Alexis Frock, MD;  [facility];  Service: Urology;  Laterality: N/A;  . EXAMINATION UNDER ANESTHESIA N/A 12/28/2014   Procedure: EXAM UNDER ANESTHESIA;  Surgeon: Michael Boston, MD;  [facility];  Service: General;  Laterality: N/A;  . FLOOR OF MOUTH BIOPSY N/A 09/16/2016   Procedure: BIOPSY OF ORAL ABCESS;  Surgeon: Leta Baptist, MD;  Location: [place];  Service: ENT;  Laterality: N/A;  . INCISION AND DRAINAGE ABSCESS N/A 09/16/2016   Procedure: INCISION AND DRAINAGE ABSCESS;  Surgeon: Leta Baptist, MD;  Location: [place];  Service: ENT;  Laterality: N/A;  . [place]  09/ 03/ 2011   dr toth  . INCISION AND DRAINAGE PERIRECTAL ABSCESS N/A 12/28/2014   Procedure: IRRIGATION AND DEBRIDEMENT PERIRECTAL ABSCESS;  Surgeon: Michael Boston, MD;  [facility];  Service: General;  Laterality: N/A;  Fistula repair and ablation  . INSERTION OF SUPRAPUBIC CATHETER N/A 10/10/2017   Procedure: INSERTION OF SUPRAPUBIC CATHETER;  Surgeon: Alexis Frock, MD;  [facility];  Service: Urology;  Laterality: N/A;  . LAPAROSCOPIC REVISION VENTRICULAR-PERITONEAL (V-P) SHUNT N/A 06/12/2017   Procedure: LAPAROSCOPIC REVISION VENTRICULAR-PERITONEAL (V-P) SHUNT;  Surgeon: Kinsinger, Arta Bruce, MD;  Location: [place];  Service: General;  Laterality: N/A;  . TRANSTHORACIC ECHOCARDIOGRAM  06/02/2017   EF 65-70%/  trivial PR  . VENTRICULOPERITONEAL SHUNT N/A 06/12/2017   Procedure: SHUNT INSERTION VENTRICULAR-PERITONEAL;  Surgeon: Consuella Lose, MD;  Location: [facility];  Service: Neurosurgery;  Laterality: N/A;    There were  no vitals filed for this visit.  Subjective Assessment - 12/04/17 1545    Subjective  "I thought this was the other one's office." (pt seen in other SLP office)    Currently in Pain?  No/denies            ADULT SLP TREATMENT - 12/04/17 1546      General Information   Behavior/Cognition  Alert;Cooperative;Pleasant mood;Distractible;Requires cueing;Decreased sustained attention      Treatment Provided   Treatment provided  Cognitive-Linquistic      Cognitive-Linquistic Treatment   Treatment focused on  Cognition    Skilled Treatment  SLP worked with pt linguistic attention skills with fake bills/coins. Pt recalled target amount with <30% without cues. SLP had pt repeat the amount and pt accuracy improved to 80%, In the last 5 minutes of therapy pt's attention to task diminished greatly and he req'd SLP mod-max A.  Pt told SLP date with modified independnece (looked at calendar on wall).      Assessment / Recommendations / Plan   Plan  Goals updated      Progression Toward Goals   Progression toward goals  Progressing toward goals       SLP Education - 12/04/17 1647    Education provided  Yes    Education Details  repeating information may assist pt memory    Person(s) Educated  Patient    Methods  Explanation    Comprehension  Verbalized understanding       SLP Short Term Goals - 11/29/17 1515      SLP SHORT TERM GOAL #1   Title  pt will demo sustained attention for 5 minutes, in 3 different tasks completed 90% success, in 3 sessions    Status  Not Met      SLP SHORT TERM GOAL #2   Title  pt will tell SLP 2 cognitive defict areas with min A occasionallly, in three sessions    Status  Not Met      SLP SHORT TERM GOAL #3   Title  pt will demo orientation to year, month, date with modified independence (calendar)    Status  Not Met      SLP SHORT TERM GOAL #4   Title  pt will demo sustained attention to one task for 10 minutes to achieve 90% success    Status  Not  Met       SLP Long Term Goals - 12/04/17 1644      SLP LONG TERM GOAL #1   Title  pt will demonstrate sustained attention to one task for 15 mintues in order to achieve 90% success with rare nonverbal cues back to task, in two sessions    Baseline  7 minutes (11-05-17); 20 minutes 11/27/17, 11-29-17    Period  -- or 20 visits    Status  Achieved      SLP LONG TERM GOAL #2   Title  pt will demo emergent awareness in a simple paper/pencil cognitive linguistic  task 60% of the time given pt double checking responses over two sessions    Baseline  <20% of the time (11-05-17);   11/27/17    Time  1    Period  Weeks or 20 visits    Status  Revised      SLP LONG TERM GOAL #3   Title  pt will demo orientation to month, day, date with modified independence over 8 sessions    Baseline  one session (11-05-17), 11/27/17, 11/29/17; 12-04-17    Time  1    Period  Weeks or 20 visits    Status  On-going       Plan - 12/04/17 1648    Clinical Impression Statement  Pt with improved attention and processing speed since last seen by this SLP, however cont with mod-severe cognitive linguistic deficits, with worsening deficits after 20-25 minutes of session today. Much more conversant, as pt initiated comment at beginning of therapy session. Pt continues to require 24 hour care and would benefit from a consistent day program.  Continue skilled ST to maximize cognition for safety and to reduce caregiver burden.    Speech Therapy Frequency  2x / week    Duration  -- 12 visits through 12-11-17    Treatment/Interventions  Language facilitation;Compensatory techniques;Internal/external aids;SLP instruction and feedback;Cognitive reorganization;Environmental controls;Multimodal communcation approach;Functional tasks;Cueing hierarchy;Patient/family education    Potential to Achieve Goals  Fair    Potential Considerations  Severity of impairments;Cooperation/participation level;Family/community support;Ability to  learn/carryover information       Patient will benefit from skilled therapeutic intervention in order to improve the following deficits and impairments:   Cognitive communication deficit    Problem List Patient Active Problem List   Diagnosis Date Noted  . Fecal incontinence 11/19/2017  . Seizure disorder ([place]) 09/12/2017  . Urinary retention 07/12/2017  . Acute renal insufficiency 07/12/2017  . Acute cystitis without hematuria   . Hypotension   . 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 Acute urinary retention 06/26/2017  . Staphylococcus epidermidis ventriculitis 06/26/2017  . Bacteremia due to Streptococcus pneumoniae 06/26/2017  . Bordetella pertussis 06/26/2017  . Chronic viral hepatitis B without coma and with delta agent ([place])   . Transaminitis   . Endotracheal tube present   . Status epilepticus ([place])   . History of syphilis   . Tobacco abuse   . Meningoencephalitis   . Cryptococcal meningitis ([place])   . Seizure ([place]) 05/20/2017  . Peri-rectal abscess 03/06/2017  . 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 Anemia 09/12/2016  . 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 Bipolar disorder ([place]) 04/08/2013  . Diaper rash 10/10/2012    [facility] ,[place], CCC-SLP  12/04/2017, 4:51 PM  [facility] [address] [place], [place], [address] Phone: [phone]   Fax:  [phone]   Name: Justin Ball MRN: [id number] Date of Birth: 1991-09-18

## 2017-12-04 NOTE — Telephone Encounter (Signed)
The pharmacy sent us a message to let us know that they can't get someone to pick up his meds. Called his grandmother because Darrin LuisLinston has been staying with her there. She state that his mother is wanting his check to be sent to her house but Finneus can stay at his grandmother. Basically, she is saying that his mom would only want the check sent to her so she can keep it.   The grandmother can't get out of her driveway to pick up his meds. Will get the pharmacy to mail it to the grandmother's house. She would also like a call from Dr. Daiva EvesVan Dam to discuss the situation. [name](Sue - grandmother [phone]

## 2017-12-06 ENCOUNTER — Ambulatory Visit: Payer: Medicaid Other

## 2017-12-06 ENCOUNTER — Other Ambulatory Visit: Payer: Self-pay

## 2017-12-06 DIAGNOSIS — R41841 Cognitive communication deficit: Secondary | ICD-10-CM

## 2017-12-06 DIAGNOSIS — R41840 Attention and concentration deficit: Secondary | ICD-10-CM | POA: Diagnosis not present

## 2017-12-06 NOTE — Telephone Encounter (Signed)
I think we need to engage with Mitch when he gets back into town. I think he will know how to best navigate this situation.

## 2017-12-06 NOTE — Therapy (Signed)
[facility] [address] [place], [place], [address] Phone: [phone]   Fax:  [phone]  Speech Language Pathology Treatment  Patient Details  Name: Justin Ball MRN: [id number] Date of Birth: 1991-05-10 Referring Provider: Tommy Medal, Roderic Scarce, MD   Encounter Date: 12/06/2017  End of Session - 12/06/17 1701    Visit Number  18    Number of Visits  20    Date for SLP Re-Evaluation  10/19/17    Authorization Time Period  medicain approved 8 visits 11/14/17 to 12/11/17 per [place] - Visit Number  7    Authorization - Number of Visits  8    SLP Start Time  0737    SLP Stop Time   1615    SLP Time Calculation (min)  41 min    Activity Tolerance  Patient tolerated treatment well       Past Medical History:  Diagnosis Date  . 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 Anxiety   . 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 Bipolar 1 disorder ([place])   . Chronic viral hepatitis B without coma and with delta agent ([place])   . Cognitive communication deficit    working w/ therapy  . 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 Depression   . 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 Fecal incontinence 11/19/2017  . Foley catheter in place   . 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 History of scabies 2013  . History of syphilis   . 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 Neurogenic bladder    secondary to damage from brain  infection  . Neurogenic bowel    wears depends  . Neuromuscular disorder ([place])   . 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 Seizure disorder [facility])    neurologist-  dr Leta Baptist ([facility])--caused by cryptococcal meningitis/ multiple infarctions--  no seizure since hospitalization May 2018  . Status epilepticus ([place])   . Visuospatial deficit     Past Surgical History:  Procedure Laterality Date  . CYSTOSCOPY N/A 10/10/2017   Procedure: CYSTOSCOPY;  Surgeon: Alexis Frock, MD;  Location: [facility];  Service: Urology;  Laterality: N/A;  . EXAMINATION UNDER ANESTHESIA N/A 12/28/2014   Procedure: EXAM UNDER ANESTHESIA;  Surgeon: Michael Boston, MD;  Location: [facility];  Service: General;  Laterality: N/A;  . FLOOR OF MOUTH BIOPSY N/A 09/16/2016   Procedure: BIOPSY OF ORAL ABCESS;  Surgeon: Leta Baptist, MD;  Location: [facility];  Service: ENT;  Laterality: N/A;  . INCISION AND DRAINAGE ABSCESS N/A 09/16/2016   Procedure: INCISION AND DRAINAGE ABSCESS;  Surgeon: Leta Baptist, MD;  Location: [place];  Service: ENT;  Laterality: N/A;  . [place]  09/ 03/ 2011   dr toth  . INCISION AND DRAINAGE PERIRECTAL ABSCESS N/A 12/28/2014   Procedure: IRRIGATION AND DEBRIDEMENT PERIRECTAL ABSCESS;  Surgeon: Michael Boston, MD;  Location[facility];  Service: General;  Laterality: N/A;  Fistula repair and ablation  .  INSERTION OF SUPRAPUBIC CATHETER N/A 10/10/2017   Procedure: INSERTION OF SUPRAPUBIC CATHETER;  Surgeon: Alexis Frock, MD;  Location: [facility];  Service: Urology;  Laterality: N/A;  . LAPAROSCOPIC REVISION VENTRICULAR-PERITONEAL (V-P) SHUNT N/A 06/12/2017   Procedure: LAPAROSCOPIC REVISION VENTRICULAR-PERITONEAL (V-P) SHUNT;  Surgeon: Kinsinger, Arta Bruce, MD;  Location: [place];  Service: General;  Laterality: N/A;  . TRANSTHORACIC ECHOCARDIOGRAM  06/02/2017   EF 65-70%/  trivial PR  . VENTRICULOPERITONEAL SHUNT N/A 06/12/2017   Procedure: SHUNT INSERTION  VENTRICULAR-PERITONEAL;  Surgeon: Consuella Lose, MD;  Location: [facility];  Service: Neurosurgery;  Laterality: N/A;    There were no vitals filed for this visit.  Subjective Assessment - 12/06/17 1542    Subjective  "I'm doing cooking at home - mac and cheese and fried chicken." [name] is present in room when pt cooking.    Currently in Pain?  No/denies            ADULT SLP TREATMENT - 12/06/17 1608      General Information   Behavior/Cognition  Alert;Cooperative;Pleasant mood;Distractible;Requires cueing;Decreased sustained attention      Treatment Provided   Treatment provided  Cognitive-Linquistic      Cognitive-Linquistic Treatment   Treatment focused on  Cognition    Skilled Treatment  Pt mentioned he is cooking at home - SLP had pt put 5 written steps of cooking situations into order - pt req'd consistent min-mod A for emergent awareness and consistent mod-max A for attention to task. SLP then used verbal description (similarity/difference) to work on pt attention - pt req'd SLP cues occasionally for staying with either similarity or difference      Assessment / Recommendations / [place] with current plan of care      Progression Toward Goals   Progression toward goals  Progressing toward goals         SLP Short Term Goals - 11/29/17 1515      SLP SHORT TERM GOAL #1   Title  pt will demo sustained attention for 5 minutes, in 3 different tasks completed 90% success, in 3 sessions    Status  Not Met      SLP SHORT TERM GOAL #2   Title  pt will tell SLP 2 cognitive defict areas with min A occasionallly, in three sessions    Status  Not Met      SLP SHORT TERM GOAL #3   Title  pt will demo orientation to year, month, date with modified independence (calendar)    Status  Not Met      SLP SHORT TERM GOAL #4   Title  pt will demo sustained attention to one task for 10 minutes to achieve 90% success    Status  Not Met       SLP Long Term Goals  - 12/06/17 1703      SLP LONG TERM GOAL #1   Title  pt will demonstrate sustained attention to one task for 15 mintues in order to achieve 90% success with rare nonverbal cues back to task, in two sessions    Baseline  7 minutes (11-05-17); 20 minutes 11/27/17, 11-29-17    Period  -- or 20 visits    Status  Achieved      SLP LONG TERM GOAL #2   Title  pt will demo emergent awareness in a simple paper/pencil cognitive linguistic task 60% of the time given pt double checking responses over two sessions  Baseline  <20% of the time (11-05-17);   11/27/17    Time  1    Period  Weeks or 20 visits    Status  Revised      SLP LONG TERM GOAL #3   Title  pt will demo orientation to month, day, date with modified independence over 8 sessions    Baseline  one session (11-05-17), 11/27/17, 11/29/17; 12-04-17    Time  1    Period  Weeks or 20 visits    Status  On-going       Plan - 12/06/17 1702    Clinical Impression Statement  Pt  cont with mod-severe cognitive linguistic deficits. Again, pt much more conversant, as pt again initiated comments at beginning of therapy session. Pt req'd cues today for attention consistently from task to task, with reduced awareness of errors. Pt continues to require 24 hour care and would benefit from a consistent day program.  Continue skilled ST to maximize cognition for safety and to reduce caregiver burden.    Speech Therapy Frequency  2x / week    Duration  -- 12 visits through 12-11-17    Treatment/Interventions  Language facilitation;Compensatory techniques;Internal/external aids;SLP instruction and feedback;Cognitive reorganization;Environmental controls;Multimodal communcation approach;Functional tasks;Cueing hierarchy;Patient/family education    Potential to Achieve Goals  Fair    Potential Considerations  Severity of impairments;Cooperation/participation level;Family/community support;Ability to learn/carryover information       Patient will benefit from  skilled therapeutic intervention in order to improve the following deficits and impairments:   Cognitive communication deficit    Problem List Patient Active Problem List   Diagnosis Date Noted  . Fecal incontinence 11/19/2017  . Seizure disorder ([place]) 09/12/2017  . Urinary retention 07/12/2017  . Acute renal insufficiency 07/12/2017  . Acute cystitis without hematuria   . Hypotension   . 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 Acute urinary retention 06/26/2017  . Staphylococcus epidermidis ventriculitis 06/26/2017  . Bacteremia due to Streptococcus pneumoniae 06/26/2017  . Bordetella pertussis 06/26/2017  . 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 Transaminitis   . Endotracheal tube present   . Status epilepticus ([place])   . History of syphilis   . Tobacco abuse   . Meningoencephalitis   . Cryptococcal meningitis ([place])   . Seizure ([place]) 05/20/2017  . Peri-rectal abscess 03/06/2017  . 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 Anemia 09/12/2016  . 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 Bipolar disorder ([place]) 04/08/2013  . Diaper rash 10/10/2012    [facility] ,[place], CCC-SLP  12/06/2017, 5:04 PM  [facility] [address] [place], [place], [address] Phone: [phone]   Fax:  [phone]   Name: Justin Ball MRN: [id number] Date of Birth: [birth year]

## 2017-12-10 MED FILL — TIVICAY 50 MG TABLET: 50 | 30 days supply | Qty: 30 | Fill #5

## 2017-12-10 MED FILL — levETIRAcetam 1000 MG TABS: 1000 | 30 days supply | Qty: 60 | Fill #3

## 2017-12-10 MED FILL — ODEFSEY 200-25-25 MG TABS: 200-25-25 | 30 days supply | Qty: 30 | Fill #5

## 2017-12-10 NOTE — Telephone Encounter (Signed)
tHANKS jACKIE!

## 2017-12-10 NOTE — Telephone Encounter (Signed)
Ok, message printed and put in Mitch's in box for when he returns. Annice PihJackie

## 2017-12-11 ENCOUNTER — Other Ambulatory Visit: Payer: Self-pay

## 2017-12-11 ENCOUNTER — Ambulatory Visit: Payer: Medicaid Other

## 2017-12-11 DIAGNOSIS — R41840 Attention and concentration deficit: Secondary | ICD-10-CM | POA: Diagnosis not present

## 2017-12-11 DIAGNOSIS — R41841 Cognitive communication deficit: Secondary | ICD-10-CM

## 2017-12-11 NOTE — Therapy (Signed)
[facility] [address] [place], [place], [address] Phone: [phone]   Fax:  [phone]  Speech Language Pathology Treatment  Patient Details  Name: Justin Ball MRN: [id number] Date of Birth: [birth year] Referring Provider: Tommy Medal, Roderic Scarce, MD   Encounter Date: 12/11/2017  End of Session - 12/11/17 1636    Visit Number  19    Number of Visits  20    Date for SLP Re-Evaluation  10/19/17    Authorization Time Period  medicain approved 8 visits 11/14/17 to 12/11/17 per [place] - Visit Number  8    Authorization - Number of Visits  8    SLP Start Time  9323    SLP Stop Time   5573    SLP Time Calculation (min)  43 min    Activity Tolerance  Patient tolerated treatment well       Past Medical History:  Diagnosis Date  . 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 Anxiety   . 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 Bipolar 1 disorder ([place])   . 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 Cognitive communication deficit    working w/ therapy  . 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 Depression   . 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 Fecal incontinence 11/19/2017  . Foley catheter in place   . 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 History of scabies 2013  . History of syphilis   . 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 Neurogenic bladder    secondary to damage from brain  infection  . Neurogenic bowel    wears depends  . Neuromuscular disorder ([place])   . 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 Seizure disorder [facility])    neurologist-  dr Leta Baptist ([facility])--caused by cryptococcal meningitis/ multiple infarctions--  no seizure since hospitalization May 2018  . Status epilepticus ([place])   . Visuospatial deficit     Past Surgical History:  Procedure Laterality Date  . CYSTOSCOPY N/A 10/10/2017   Procedure: CYSTOSCOPY;  Surgeon: Alexis Frock, MD;  Location: [facility];  Service: Urology;  Laterality: N/A;  . EXAMINATION UNDER ANESTHESIA N/A 12/28/2014   Procedure: EXAM UNDER ANESTHESIA;  Surgeon: Michael Boston, MD;  [facility];  Service: General;  Laterality: N/A;  . FLOOR OF MOUTH BIOPSY N/A 09/16/2016   Procedure: BIOPSY OF ORAL ABCESS;  Surgeon: Leta Baptist, MD;  Location: [place];  Service: ENT;  Laterality: N/A;  . INCISION AND DRAINAGE ABSCESS N/A 09/16/2016   Procedure: INCISION AND DRAINAGE ABSCESS;  Surgeon: Leta Baptist, MD;  Location: [facility];  Service: ENT;  Laterality: N/A;  . [place]  09/ 03/ 2011   dr toth  . INCISION AND DRAINAGE PERIRECTAL ABSCESS N/A 12/28/2014   Procedure: IRRIGATION AND DEBRIDEMENT PERIRECTAL ABSCESS;  Surgeon: Michael Boston, MD;  [facility];  Service: General;  Laterality: N/A;  Fistula repair and ablation  .  INSERTION OF SUPRAPUBIC CATHETER N/A 10/10/2017   Procedure: INSERTION OF SUPRAPUBIC CATHETER;  Surgeon: Alexis Frock, MD;  Location[facility];  Service: Urology;  Laterality: N/A;  . LAPAROSCOPIC REVISION VENTRICULAR-PERITONEAL (V-P) SHUNT N/A 06/12/2017   Procedure: LAPAROSCOPIC REVISION VENTRICULAR-PERITONEAL (V-P) SHUNT;  Surgeon: Kinsinger, Arta Bruce, MD;  Location: [facility];  Service: General;  Laterality: N/A;  . TRANSTHORACIC ECHOCARDIOGRAM  06/02/2017   EF 65-70%/  trivial PR  . VENTRICULOPERITONEAL SHUNT N/A 06/12/2017   Procedure: SHUNT INSERTION  VENTRICULAR-PERITONEAL;  Surgeon: Consuella Lose, MD;  Location: [facility];  Service: Neurosurgery;  Laterality: N/A;    There were no vitals filed for this visit.  Subjective Assessment - 12/11/17 1539    Subjective  Pt doing stove cooking with grandma there.     Patient is accompained by:  -- grandmother            ADULT SLP TREATMENT - 12/11/17 1540      General Information   Behavior/Cognition  Alert;Cooperative;Pleasant mood;Distractible;Requires cueing;Decreased sustained attention      Treatment Provided   Treatment provided  Cognitive-Linquistic      Cognitive-Linquistic Treatment   Treatment focused on  Cognition    Skilled Treatment  Pt will have aide from approx 5-8pm. Pt is going to try day program for 2 weeks. SLP suggested vocational rehabilitation as a long term goal for pt - pt agreed today he wanted to work at some point in the future. Simple reasoning and problem solving targeted today and pt req'd usual mod A for problem solving and reasoning and consistent mod A for emergent awareness.      Assessment / Recommendations / Plan   Plan  Other (Comment) discharge - pt beginning day program      Progression Toward Goals   Progression toward goals  -- discharge day - see goal update       SLP Education - 12/11/17 1636    Education provided  Yes    Education Details  tasks aide/family could complete with [place]    Person(s) Educated  Patient;Caregiver(s)    Methods  Explanation;Handout    Comprehension  Verbalized understanding       SLP Short Term Goals - 11/29/17 1515      SLP SHORT TERM GOAL #1   Title  pt will demo sustained attention for 5 minutes, in 3 different tasks completed 90% success, in 3 sessions    Status  Not Met      SLP SHORT TERM GOAL #2   Title  pt will tell SLP 2 cognitive defict areas with min A occasionallly, in three sessions    Status  Not Met      SLP SHORT TERM GOAL #3   Title  pt will demo orientation to year, month,  date with modified independence (calendar)    Status  Not Met      SLP SHORT TERM GOAL #4   Title  pt will demo sustained attention to one task for 10 minutes to achieve 90% success    Status  Not Met       SLP Long Term Goals - 12/11/17 1554      SLP LONG TERM GOAL #1   Title  pt will demonstrate sustained attention to one task for 15 mintues in order to achieve 90% success with rare nonverbal cues back to task, in two sessions    Baseline  7 minutes (11-05-17); 20 minutes 11/27/17, 11-29-17    Status  Achieved  SLP LONG TERM GOAL #2   Title  pt will demo emergent awareness in a simple paper/pencil cognitive linguistic task 60% of the time given pt double checking responses over two sessions    Baseline  <20% of the time (11-05-17);   11/27/17    Period  -- or 20 visits    Status  Not Met      SLP LONG TERM GOAL #3   Title  pt will demo orientation to month, day, date with modified independence over 8 sessions    Baseline  one session (11-05-17), 11/27/17, 11/29/17; 12-04-17    Period  -- or 20 visits    Status  Partially Met 4 sessions       Plan - 12/11/17 1637    Clinical Impression Statement  Pt  cont with mod-severe cognitive linguistic deficits. Pt req'd cues today for problem solving/reasoning, and for awareness of errors. Pt continues to require 24 hour care and would benefit from a consistent day program, which grandma states he will begin soon. A home aide will be starting sometime in teh near future as well and SLP told grandma to instruct aide to complete activities with pt to (at least) maintain cognitive function. Continue skilled ST to maximize cognition for safety and to reduce caregiver burden.    Treatment/Interventions  Language facilitation;Compensatory techniques;Internal/external aids;SLP instruction and feedback;Cognitive reorganization;Environmental controls;Multimodal communcation approach;Functional tasks;Cueing hierarchy;Patient/family education     Potential to Achieve Goals  Fair    Potential Considerations  Severity of impairments;Cooperation/participation level;Family/community support;Ability to learn/carryover information       Patient will benefit from skilled therapeutic intervention in order to improve the following deficits and impairments:   Cognitive communication deficit   SPEECH THERAPY DISCHARGE SUMMARY  Visits from Start of Care: 19  Current functional level related to goals / functional outcomes: Pt made some gains in sustained and selective attention and minor gains in emergent awareness. Pt became more conversant and aware of surroundings in the last 3-4 sessions. He would benefit from a day program and from a nursing aide, to provide consistency in his daily routine and a social outlet, as well as someone to continue to provide stimulating cognitive tasks for him to accomplish.  SLP suggested vocational rehab as a possible longer-term goal for Vishaal, to be able to eventually hold a remedial job of some kind.   Remaining deficits: Mod-severe cognitive/cognitive linguistic impairments.   Education / Equipment: Home tasks, benefits of day program, compensatory techniques/strategies.  Plan: Patient agrees to discharge.  Patient goals were partially met. Patient is being discharged due to                                                     ?????pt beginning day program, and essentially reaching max rehab potential at this time.       Problem List Patient Active Problem List   Diagnosis Date Noted  . Fecal incontinence 11/19/2017  . Seizure disorder ([place]) 09/12/2017  . Urinary retention 07/12/2017  . Acute renal insufficiency 07/12/2017  . Acute cystitis without hematuria   . Hypotension   . 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 Acute urinary retention 06/26/2017  . Staphylococcus epidermidis ventriculitis 06/26/2017  . Bacteremia due to Streptococcus pneumoniae 06/26/2017  .  Bordetella pertussis 06/26/2017  . Chronic viral  hepatitis B without coma and with delta agent ([place])   . Transaminitis   . Endotracheal tube present   . Status epilepticus ([place])   . History of syphilis   . Tobacco abuse   . Meningoencephalitis   . Cryptococcal meningitis ([place])   . Seizure ([place]) 05/20/2017  . Peri-rectal abscess 03/06/2017  . 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 Anemia 09/12/2016  . 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 Bipolar disorder ([place]) 04/08/2013  . Diaper rash 10/10/2012    [facility] ,[place], CCC-SLP  12/11/2017, 4:39 PM  [facility] [address] [place], [place], [address] Phone: [phone]   Fax:  [phone]   Name: Justin Ball MRN: [id number] Date of Birth: 01/06/1991

## 2017-12-11 NOTE — Patient Instructions (Signed)
Have the aide complete activities with Justin Ball 2-3 times a week for 20-30 minutes  Language activities I gave you today Connect [facility] [employer] [place] Sorry!

## 2017-12-25 ENCOUNTER — Ambulatory Visit (HOSPITAL_COMMUNITY)
Admission: EM | Admit: 2017-12-25 | Discharge: 2017-12-25 | Disposition: A | Discharge status: Home / Self Care | Payer: Medicaid Other | Attending: Family Medicine | Admitting: Family Medicine

## 2017-12-25 ENCOUNTER — Encounter (HOSPITAL_COMMUNITY): Payer: Self-pay | Admitting: Emergency Medicine

## 2017-12-25 ENCOUNTER — Other Ambulatory Visit: Payer: Self-pay

## 2017-12-25 DIAGNOSIS — L03313 Cellulitis of chest wall: Secondary | ICD-10-CM

## 2017-12-25 MED ORDER — SULFAMETHOXAZOLE-TRIMETHOPRIM 800-160 MG PO TABS: 1.0000 | ORAL_TABLET | Freq: Two times a day (BID) | ORAL | 0 refills | Status: DC

## 2017-12-25 NOTE — ED Triage Notes (Addendum)
Chest pain started last night, noticed a knot in epigastric area and redness.  Patient is with a family member.  Family is dependant on family member  A lot of health issues, complicated history

## 2017-12-26 NOTE — ED Provider Notes (Signed)
[facility]   [id number] 12/25/17 Arrival Time: 1501  ASSESSMENT & PLAN:  1. Cellulitis of chest wall     Meds ordered this encounter  Medications  . sulfamethoxazole-trimethoprim (BACTRIM DS,SEPTRA DS) 800-160 MG tablet    Sig: Take 1 tablet by mouth 2 (two) times daily.    Dispense:  20 tablet    Refill:  0   Start antibiotic tonight. No need for I&D yet. Will f/u in 48-72 hours if not seeing improvement. OTC analgesics if needed.  Reviewed expectations re: course of current medical issues. Questions answered. Outlined signs and symptoms indicating need for more acute intervention. Patient verbalized understanding. After Visit Summary given.   SUBJECTIVE:  Justin Ball is a 27 y.o. male who presents with possible skin infection of his chest wall. Noticed yesterday. History from patient and his caregiver. Afebrile. Increased redness noticed this morning. No drainage. Tender to touch. No alleviating factors. No home/self treatment. No h/o similar problem on his chest wall. "Like a knot."  ROS: As per HPI.  OBJECTIVE: Vitals:   12/25/17 1531  BP: 99/62  Pulse: (!) 110  Resp: 18  Temp: 99.4 F (37.4 C)  TempSrc: Oral  SpO2: 100%    General appearance: alert; no distress Lungs: clear to auscultation bilaterally Heart: regular rate and rhythm Extremities: no edema Skin: warm and dry; anterior R chest wall with approximately 0.5cm x 1cm slight induration; tender; erythematous; non-fluctuant Psychological: alert and cooperative; normal mood and affect   No Known Allergies  Past Medical History:  Diagnosis Date  . 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 Anxiety   . 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 Bipolar 1 disorder (HCC)   . 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 Cognitive communication deficit    working w/ therapy  . 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 Depression   . 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 Fecal incontinence 11/19/2017  . Foley catheter in place   . 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 History of scabies 2013  . History of syphilis   . 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 Neurogenic bowel    wears depends  . Neuromuscular disorder (HCC)   . 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 Seizure disorder [facility])    neurologist-  dr Marjory Lies ([facility])--caused by cryptococcal meningitis/ multiple infarctions--  no seizure since hospitalization May 2018  . Status epilepticus (HCC)   . Visuospatial deficit    Social History   Socioeconomic History  . Marital status: Single    Spouse name: Not on file  . Number of children: Not on file  . Years of education: Not on file  . Highest education level: Not on file  Social Needs  . Financial resource strain: Not on file  . Food insecurity - worry: Not on file  . Food insecurity - inability: Not on file  . Transportation needs -  medical: Not on file  . Transportation needs - non-medical: Not on file  Occupational History  . Not on file  Tobacco Use  . Smoking status: Current Every Day Smoker    Packs/day: 0.50    Years: 5.00    Pack years: 2.50    Types: Cigarettes  . Smokeless tobacco: Never Used  . Tobacco comment: cutting back  Substance and Sexual Activity  . Alcohol use: No    Alcohol/week: 0.0 oz  . Drug use: No  . Sexual activity: Yes    Partners: Male     Birth control/protection: Condom    Comment: irregular condom use; educated  Other Topics Concern  . Not on file  Social History Narrative  . Not on file   Family History  Problem Relation Age of Onset  . Hypertension Other    Past Surgical History:  Procedure Laterality Date  . CYSTOSCOPY N/A 10/10/2017   Procedure: CYSTOSCOPY;  Surgeon: Sebastian AcheManny, Theodore, MD;  [facility] [place];  Service: Urology;  Laterality: N/A;  . EXAMINATION UNDER ANESTHESIA N/A 12/28/2014   Procedure: EXAM UNDER ANESTHESIA;  Surgeon: Karie SodaSteven Gross, MD;  Location: [facility];  Service: General;  Laterality: N/A;  . FLOOR OF MOUTH BIOPSY N/A 09/16/2016   Procedure: BIOPSY OF ORAL ABCESS;  Surgeon: Newman PiesSu Teoh, MD;  Location: [facility];  Service: ENT;  Laterality: N/A;  . INCISION AND DRAINAGE ABSCESS N/A 09/16/2016   Procedure: INCISION AND DRAINAGE ABSCESS;  Surgeon: Newman PiesSu Teoh, MD;  Location: [facility];  Service: ENT;  Laterality: N/A;  . INCISION AND DRAINAGE PERIRECTAL ABSCESS  09/ 03/ 2011   dr toth  . INCISION AND DRAINAGE PERIRECTAL ABSCESS N/A 12/28/2014   Procedure: IRRIGATION AND DEBRIDEMENT PERIRECTAL ABSCESS;  Surgeon: Karie SodaSteven Gross, MD;  Location: [facility];  Service: General;  Laterality: N/A;  Fistula repair and ablation  . INSERTION OF SUPRAPUBIC CATHETER N/A 10/10/2017   Procedure: INSERTION OF SUPRAPUBIC CATHETER;  Surgeon: Sebastian AcheManny, Theodore, MD;  [facility] [place];  Service: Urology;  Laterality: N/A;  . LAPAROSCOPIC REVISION VENTRICULAR-PERITONEAL (V-P) SHUNT N/A 06/12/2017   Procedure: LAPAROSCOPIC REVISION VENTRICULAR-PERITONEAL (V-P) SHUNT;  Surgeon: Kinsinger, De BlanchLuke Aaron, MD;  Location: [facility];  Service: General;  Laterality: N/A;  . TRANSTHORACIC ECHOCARDIOGRAM  06/02/2017   EF 65-70%/  trivial PR  . VENTRICULOPERITONEAL SHUNT N/A 06/12/2017   Procedure: SHUNT INSERTION VENTRICULAR-PERITONEAL;  Surgeon: Lisbeth RenshawNundkumar, Neelesh, MD;  Location: [facility];  Service: Neurosurgery;  Laterality: N/AMardella Layman;       Justin Dossantos, MD 12/26/17 1148

## 2017-12-29 NOTE — Telephone Encounter (Signed)
Order received on 09/25/17 by Dr Daiva EvesVan Dam  to [facility] Several documented attempts have been made with 1st initial attempt made on 09/25/17. Patient was evaluateda/initial home visit on 10/01/17  for [facility]. Patient was consented to care at this time.    Effective 10/01/17: 622mo3, 251mo1  3PRN's for complications with disease process/progression, medication changes or concerns   [facility] will assess for learning needs related to diagnosis and treatment regimen, provide education as needed, fill pill box if needed, address any barriers which may be preventing medication compliance, and communicating with care team including physician and case manager.   Individualized Plan Of Care Certification Period from 10/01/17 to 12/30/2017  a. Type of service(s) and care to be delivered: RN Case Management  b. Frequency and duration of service: Effective 10/01/17 612mo3, 331mo1 3 prns for  complications  with disease process/progression, medication changes or  concerns . Visits/Contact may be conducted telephonically or in person to  best suit the patient.  c. Activity restrictions: Pt may be up as tolerated and can safely ambulate  without the need for a assistive device   d. Safety Measures: Standard Precautions/Infection Control   e. Service Objectives and Goals: Service Objectives are to assist the pt with  HIV medication regimen adherence and staying in care with the Infectious  Disease Clinic by identifying barriers to care. RN will address the barriers  that  are identified by the patient. Family would like to have PCS service started and adult daycare options.  f. Equipment required: No additional equipment needs at this time   g. Functional Limitations: Severe Cognitive limitations   h. Rehabilitation potential: Guarded   i. Diet and Nutritional Needs: Regular Diet   j. Medications and treatments: Medications have been reconciled and  reviewed and  are a part of EPIC electronic file   k. Specific therapies if needed: RN   l. Pertinent diagnoses: HIV disease,  Hx of medication NonCompliance, Attention and Concentration deficit following cerebral infarction  m. Expected outcome: Guarded

## 2017-12-29 NOTE — Patient Instructions (Signed)
RN met with client and or caregiver for assessment. RN reviewed Agency Services, [place] Notice of Privacy Policy, Home Safety Management Information Booklet. Home Fire Safety Assessment, Fall Risk Assessment and Suicide Risk Assessment was performed. RN also discussed information on a Living Will, Advanced Directives, and Health Care Power of Attorney. RN and Client/Designated Party educated/reviewed/signed Client Agreement and Consent for Service form along with Patient Rights and Responsibilities statement. RN developed patient specific and centered care plan. RN provided contact information and reviewed how to receive emergency help after hours for schedule changes, billing questions, reporting of safety issues, falls, concerns or any needs/questions. Standard Precaution and Infection control along with interventions to correct or prevent high risk behaviors instructed to the patient. Client/Caregiver reports understanding and agreement with the above 

## 2017-12-29 NOTE — Progress Notes (Signed)
RN received a call from the patient's mom and she agreed to a home visit today. Arrived at the home and Ms Franco ColletBartley asked that we completed our visit outside. Ms Franco ColletBartley stated Darrin LuisLinston is on his way to the home. Ms Franco ColletBartley stated Darrin LuisLinston will be living with her from now on because her mother's home is not clean and she is concerned that Darrin LuisLinston will become sick. RN explained to Jones's mom that he became sick because he was not taking his HIV medications. Ms Franco ColletBartley stated Darrin LuisLinston is not taking his medications and she fills a pillbox to be sure of it. Explained to Ms Franco ColletBartley that we(clinic) are concerned about Revin because he was taking the same medications and his viral load drastically decreased. Recently Earlin's viral load has became detectable again. Visually showed Ms Franco ColletBartley the lab work with dates to further explain that the medication will work when taken as ordered. At this time Ms Bartley's mother came to the home with Domonique. After speaking with Ibrahim briefly he was asked to go inside. Elgie's grandmother asked about daycare facilities for Glyn. I explained to her that I can complete a PCS referral and she will need to follow up on daycare facilities that will meet her needs. I also showed Daran's grandmother his current labs that suggest he is not taking his medications as ordered. Currently the plan is to follow up with Tenoch's mother to arrange the next visit

## 2017-12-30 ENCOUNTER — Encounter (HOSPITAL_COMMUNITY): Payer: Self-pay | Admitting: *Deleted

## 2017-12-30 ENCOUNTER — Emergency Department (HOSPITAL_COMMUNITY): Payer: Medicaid Other

## 2017-12-30 ENCOUNTER — Inpatient Hospital Stay (HOSPITAL_COMMUNITY)
Admission: EM | Admit: 2017-12-30 | Discharge: 2018-01-17 | DRG: 025 | Disposition: A | Discharge status: Home Health | Payer: Medicaid Other | Attending: Internal Medicine | Admitting: Internal Medicine

## 2017-12-30 ENCOUNTER — Other Ambulatory Visit: Payer: Self-pay

## 2017-12-30 DIAGNOSIS — K56609 Unspecified intestinal obstruction, unspecified as to partial versus complete obstruction: Secondary | ICD-10-CM

## 2017-12-30 DIAGNOSIS — Z982 Presence of cerebrospinal fluid drainage device: Secondary | ICD-10-CM

## 2017-12-30 DIAGNOSIS — K651 Peritoneal abscess: Secondary | ICD-10-CM | POA: Diagnosis not present

## 2017-12-30 DIAGNOSIS — Z8661 Personal history of infections of the central nervous system: Secondary | ICD-10-CM

## 2017-12-30 DIAGNOSIS — K592 Neurogenic bowel, not elsewhere classified: Secondary | ICD-10-CM | POA: Diagnosis present

## 2017-12-30 DIAGNOSIS — B180 Chronic viral hepatitis B with delta-agent: Secondary | ICD-10-CM | POA: Diagnosis present

## 2017-12-30 DIAGNOSIS — F17210 Nicotine dependence, cigarettes, uncomplicated: Secondary | ICD-10-CM | POA: Diagnosis present

## 2017-12-30 DIAGNOSIS — T85730A Infection and inflammatory reaction due to ventricular intracranial (communicating) shunt, initial encounter: Secondary | ICD-10-CM | POA: Diagnosis present

## 2017-12-30 DIAGNOSIS — E876 Hypokalemia: Secondary | ICD-10-CM | POA: Diagnosis not present

## 2017-12-30 DIAGNOSIS — D013 Carcinoma in situ of anus and anal canal: Secondary | ICD-10-CM | POA: Diagnosis present

## 2017-12-30 DIAGNOSIS — Z881 Allergy status to other antibiotic agents status: Secondary | ICD-10-CM

## 2017-12-30 DIAGNOSIS — N319 Neuromuscular dysfunction of bladder, unspecified: Secondary | ICD-10-CM | POA: Diagnosis present

## 2017-12-30 DIAGNOSIS — Z4659 Encounter for fitting and adjustment of other gastrointestinal appliance and device: Secondary | ICD-10-CM

## 2017-12-30 DIAGNOSIS — Z79891 Long term (current) use of opiate analgesic: Secondary | ICD-10-CM

## 2017-12-30 DIAGNOSIS — L03313 Cellulitis of chest wall: Secondary | ICD-10-CM

## 2017-12-30 DIAGNOSIS — Z79899 Other long term (current) drug therapy: Secondary | ICD-10-CM

## 2017-12-30 DIAGNOSIS — B20 Human immunodeficiency virus [HIV] disease: Secondary | ICD-10-CM | POA: Diagnosis present

## 2017-12-30 DIAGNOSIS — I69311 Memory deficit following cerebral infarction: Secondary | ICD-10-CM

## 2017-12-30 DIAGNOSIS — R109 Unspecified abdominal pain: Secondary | ICD-10-CM

## 2017-12-30 DIAGNOSIS — F319 Bipolar disorder, unspecified: Secondary | ICD-10-CM | POA: Diagnosis present

## 2017-12-30 DIAGNOSIS — Z8249 Family history of ischemic heart disease and other diseases of the circulatory system: Secondary | ICD-10-CM

## 2017-12-30 DIAGNOSIS — L03311 Cellulitis of abdominal wall: Secondary | ICD-10-CM | POA: Diagnosis present

## 2017-12-30 DIAGNOSIS — G40909 Epilepsy, unspecified, not intractable, without status epilepticus: Secondary | ICD-10-CM

## 2017-12-30 DIAGNOSIS — Z8619 Personal history of other infectious and parasitic diseases: Secondary | ICD-10-CM | POA: Diagnosis present

## 2017-12-30 DIAGNOSIS — K567 Ileus, unspecified: Secondary | ICD-10-CM | POA: Diagnosis not present

## 2017-12-30 DIAGNOSIS — L0390 Cellulitis, unspecified: Secondary | ICD-10-CM | POA: Diagnosis present

## 2017-12-30 DIAGNOSIS — R7881 Bacteremia: Secondary | ICD-10-CM | POA: Diagnosis present

## 2017-12-30 DIAGNOSIS — R188 Other ascites: Secondary | ICD-10-CM

## 2017-12-30 DIAGNOSIS — Y831 Surgical operation with implant of artificial internal device as the cause of abnormal reaction of the patient, or of later complication, without mention of misadventure at the time of the procedure: Secondary | ICD-10-CM | POA: Diagnosis present

## 2017-12-30 DIAGNOSIS — A411 Sepsis due to other specified staphylococcus: Secondary | ICD-10-CM | POA: Diagnosis present

## 2017-12-30 DIAGNOSIS — F419 Anxiety disorder, unspecified: Secondary | ICD-10-CM | POA: Diagnosis present

## 2017-12-30 DIAGNOSIS — R112 Nausea with vomiting, unspecified: Secondary | ICD-10-CM

## 2017-12-30 LAB — URINALYSIS, ROUTINE W REFLEX MICROSCOPIC
Glucose, UA: NEGATIVE mg/dL
KETONES UR: NEGATIVE mg/dL
Nitrite: NEGATIVE
PH: 5 (ref 5.0–8.0)
Protein, ur: 100 mg/dL — AB
SQUAMOUS EPITHELIAL / LPF: NONE SEEN
Specific Gravity, Urine: 1.028 (ref 1.005–1.030)

## 2017-12-30 LAB — COMPREHENSIVE METABOLIC PANEL
ALBUMIN: 3.1 g/dL — AB (ref 3.5–5.0)
ALT: 35 U/L (ref 17–63)
ANION GAP: 11 (ref 5–15)
AST: 44 U/L — ABNORMAL HIGH (ref 15–41)
Alkaline Phosphatase: 96 U/L (ref 38–126)
BUN: 6 mg/dL (ref 6–20)
CHLORIDE: 98 mmol/L — AB (ref 101–111)
CO2: 23 mmol/L (ref 22–32)
Calcium: 8.9 mg/dL (ref 8.9–10.3)
Creatinine, Ser: 1.33 mg/dL — ABNORMAL HIGH (ref 0.61–1.24)
GFR calc Af Amer: >60 mL/min (ref >60)
GLUCOSE: 116 mg/dL — AB (ref 65–99)
Potassium: 3.1 mmol/L — ABNORMAL LOW (ref 3.5–5.1)
Sodium: 132 mmol/L — ABNORMAL LOW (ref 135–145)
TOTAL PROTEIN: 9 g/dL — AB (ref 6.5–8.1)
Total Bilirubin: 1.8 mg/dL — ABNORMAL HIGH (ref 0.3–1.2)

## 2017-12-30 LAB — CBC WITH DIFFERENTIAL/PLATELET
BLASTS: 0 %
Band Neutrophils: 0 %
Basophils Absolute: 0 10*3/uL (ref 0.0–0.1)
Basophils Relative: 0 %
Eosinophils Absolute: 0 10*3/uL (ref 0.0–0.7)
Eosinophils Relative: 0 %
HEMATOCRIT: 37.1 % — AB (ref 39.0–52.0)
HEMOGLOBIN: 12.4 g/dL — AB (ref 13.0–17.0)
Lymphocytes Relative: 3 %
Lymphs Abs: 0.8 10*3/uL (ref 0.7–4.0)
MCH: 31.8 pg (ref 26.0–34.0)
MCHC: 33.4 g/dL (ref 30.0–36.0)
MCV: 95.1 fL (ref 78.0–100.0)
MONOS PCT: 11 %
Metamyelocytes Relative: 0 %
Monocytes Absolute: 3 10*3/uL — ABNORMAL HIGH (ref 0.1–1.0)
Myelocytes: 1 %
NEUTROS PCT: 85 %
NRBC: 0 /100{WBCs}
Neutro Abs: 23.9 10*3/uL — ABNORMAL HIGH (ref 1.7–7.7)
Other: 0 %
PROMYELOCYTES ABS: 0 %
Platelets: 403 10*3/uL — ABNORMAL HIGH (ref 150–400)
RBC: 3.9 MIL/uL — ABNORMAL LOW (ref 4.22–5.81)
RDW: 13.5 % (ref 11.5–15.5)
WBC: 27.7 10*3/uL — ABNORMAL HIGH (ref 4.0–10.5)

## 2017-12-30 LAB — I-STAT CG4 LACTIC ACID, ED
LACTIC ACID, VENOUS: 1.54 mmol/L (ref 0.5–1.9)
Lactic Acid, Venous: 1.72 mmol/L (ref 0.5–1.9)

## 2017-12-30 LAB — PROTIME-INR
INR: 1.14
Prothrombin Time: 14.5 seconds (ref 11.4–15.2)

## 2017-12-30 MED ORDER — SODIUM CHLORIDE 0.9 % IV BOLUS (SEPSIS)
2000.0000 mL | Freq: Once | INTRAVENOUS | Status: AC
  Administered 2017-12-30: 2000 mL via INTRAVENOUS

## 2017-12-30 MED ORDER — VANCOMYCIN HCL 10 G IV SOLR
1500.0000 mg | Freq: Once | INTRAVENOUS | Status: AC
  Administered 2017-12-31: 1500 mg via INTRAVENOUS
  Filled 2017-12-30: qty 1500

## 2017-12-30 MED ORDER — ACETAMINOPHEN 325 MG PO TABS
650.0000 mg | ORAL_TABLET | Freq: Once | ORAL | Status: AC
  Administered 2017-12-30: 650 mg via ORAL
  Filled 2017-12-30: qty 2

## 2017-12-30 MED ORDER — POTASSIUM CHLORIDE 10 MEQ/100ML IV SOLN
10.0000 meq | INTRAVENOUS | Status: DC
  Administered 2017-12-31 (×2): 10 meq via INTRAVENOUS
  Filled 2017-12-30 (×2): qty 100

## 2017-12-30 MED ORDER — ACETAMINOPHEN 500 MG PO TABS
1000.0000 mg | ORAL_TABLET | Freq: Once | ORAL | Status: AC
  Administered 2017-12-30: 1000 mg via ORAL
  Filled 2017-12-30: qty 2

## 2017-12-30 MED ORDER — DEXTROSE 5 % IV SOLN
2.0000 g | Freq: Once | INTRAVENOUS | Status: AC
  Administered 2017-12-31: 2 g via INTRAVENOUS
  Filled 2017-12-30: qty 2

## 2017-12-30 MED ORDER — IOPAMIDOL (ISOVUE-300) INJECTION 61%
INTRAVENOUS | Status: AC
  Administered 2017-12-30: 100 mL via INTRAVENOUS
  Filled 2017-12-30: qty 100

## 2017-12-30 NOTE — ED Notes (Signed)
ED Provider at bedside. 

## 2017-12-30 NOTE — ED Provider Notes (Signed)
[facility] EMERGENCY DEPARTMENT Provider Note   CSN: [id number] Arrival date & time: 12/30/17  1759     History   Chief Complaint Chief Complaint  Patient presents with  . Chest Pain  . Fever    HPI Justin Ball is a 27 y.o. male. 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 He is currently compliant.   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 He required interventricular catheter and ultimately VP shunt placement by neurosurgery.  He had prolonged hospitalization and prolonged ecchymotic treatment. He lives with his grandmother who accompanies him here. She states she is compliant with medications.  For about 6 or 7 days he's complained of discomfort. Initially in the base of his right neck and upper part of his chest in the area of his VP shunt catheter. The course of the week he developed pain from his clavicle area to his xiphoid area along the course of the shunt catheter to his become red. He was seen and evaluated 2 days ago at urgent care and placed on Bactrim and discharged. He developed fever weakness and worsening pain in the area and his grandmother and he present here.  He has not been confused. He has continued suprapubic catheter. Grandmother states he is mentating at his baseline. Actually earlier today he had been out with some friends. He started to feel worse and his friends brought him home and grandmother brought him here.  Past Medical History:  Diagnosis Date  . 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 Anxiety   . 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 Bipolar 1 disorder (HCC)   . 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  Cognitive communication deficit    working w/ therapy  . 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 Depression   . 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 Fecal incontinence 11/19/2017  . Foley catheter in place   . 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 History of scabies 2013  . History of syphilis   . 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 Neurogenic bowel    wears depends  . Neuromuscular disorder (HCC)   . 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 Seizure disorder [facility])    neurologist-  dr Marjory Lies ([facility])--caused by cryptococcal meningitis/ multiple infarctions--  no seizure since hospitalization May 2018  . Status epilepticus (HCC)   . Visuospatial deficit     Patient Active Problem List   Diagnosis Date Noted  . Fecal incontinence 11/19/2017  . Seizure disorder (HCC) 09/12/2017  . Urinary retention 07/12/2017  . Acute renal insufficiency 07/12/2017  . Acute cystitis without hematuria   . Hypotension   . 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 Acute urinary retention 06/26/2017  . Staphylococcus epidermidis ventriculitis 06/26/2017  . Bacteremia  due to Streptococcus pneumoniae 06/26/2017  . Bordetella pertussis 06/26/2017  . 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 Transaminitis   . Endotracheal tube present   . Status epilepticus (HCC)   . History of syphilis   . Tobacco abuse   . Meningoencephalitis   . 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 Seizure (HCC) 05/20/2017  . Peri-rectal abscess 03/06/2017  . 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 Anemia 09/12/2016  . 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 Bipolar disorder (HCC) 04/08/2013  . Diaper rash 10/10/2012    Past Surgical History:  Procedure Laterality Date  . CYSTOSCOPY N/A 10/10/2017   Procedure: CYSTOSCOPY;  Surgeon: Sebastian Ache, MD;  Location: [facility];  Service: Urology;  Laterality: N/A;  . EXAMINATION UNDER ANESTHESIA N/A 12/28/2014   Procedure: EXAM UNDER ANESTHESIA;  Surgeon: Karie Soda, MD;  Location: [facility];  Service: General;  Laterality: N/A;  . FLOOR OF MOUTH BIOPSY N/A 09/16/2016   Procedure: BIOPSY OF ORAL ABCESS;  Surgeon: Newman Pies, MD;  Location: [facility];  Service: ENT;  Laterality: N/A;  . INCISION AND DRAINAGE ABSCESS N/A 09/16/2016   Procedure: INCISION AND DRAINAGE ABSCESS;  Surgeon: Newman Pies, MD;  Location: [facility];  Service: ENT;  Laterality: N/A;  . INCISION AND DRAINAGE PERIRECTAL ABSCESS  09/ 03/ 2011   dr toth  . INCISION AND DRAINAGE PERIRECTAL ABSCESS N/A 12/28/2014   Procedure: IRRIGATION AND DEBRIDEMENT PERIRECTAL ABSCESS;  Surgeon: Karie Soda, MD;  Location: [facility];  Service: General;  Laterality: N/A;  Fistula repair and ablation  . INSERTION OF SUPRAPUBIC CATHETER N/A 10/10/2017   Procedure: INSERTION OF SUPRAPUBIC CATHETER;  Surgeon: Sebastian Ache, MD;  Location: [facility];  Service: Urology;  Laterality: N/A;  . LAPAROSCOPIC REVISION VENTRICULAR-PERITONEAL (V-P) SHUNT N/A 06/12/2017   Procedure: LAPAROSCOPIC REVISION VENTRICULAR-PERITONEAL (V-P) SHUNT;  Surgeon: Kinsinger, De Blanch, MD;  Location: [facility];  Service: General;  Laterality: N/A;  . TRANSTHORACIC ECHOCARDIOGRAM  06/02/2017   EF 65-70%/  trivial PR  . VENTRICULOPERITONEAL SHUNT N/A 06/12/2017   Procedure: SHUNT INSERTION VENTRICULAR-PERITONEAL;  Surgeon: [name], MD;  Location: [facility];  Service: Neurosurgery;  Laterality: N/A;       Home Medications     Prior to Admission medications   Medication Sig Start Date End Date Taking? Authorizing Provider  acetaminophen (TYLENOL) 325 MG tablet Take 2 tablets (650 mg total) by mouth every 4 (four) hours as needed for mild pain (temp > 101.5). 06/28/17  Yes Cliffton Asters, MD  dolutegravir (TIVICAY) 50 MG tablet Take 1 tablet (50 mg total) by mouth daily. Patient taking differently: Take 50 mg by mouth every morning.  07/20/17  Yes [name], Lisette Grinder, MD  emtricitabine-rilpivir-tenofovir AF (ODEFSEY) 200-25-25 MG TABS tablet Take 1 tablet by mouth daily with breakfast. Patient taking differently: Take 1 tablet by mouth daily with breakfast.  07/20/17  Yes [name], Lisette Grinder, MD  levETIRAcetam (KEPPRA) 1000 MG tablet Take 1 tablet (1,000 mg total) by mouth 2 (two) times daily. Patient taking differently: Take 1,000 mg by mouth 2 (two) times daily.  07/20/17  Yes [name], Lisette Grinder, MD  QUEtiapine (SEROQUEL) 25 MG tablet Take 1 tablet (25 mg total) by mouth 2 (two) times daily as needed (agitation). Patient taking differently: Take 25 mg by mouth 2 (two) times daily.  07/20/17  Yes [name], Lisette Grinder, MD  sulfamethoxazole-trimethoprim (BACTRIM DS,SEPTRA DS) 800-160 MG tablet Take 1 tablet by mouth 2 (two) times daily. 12/25/17  Yes [name], MD  traMADol (ULTRAM) 50 MG tablet Take 1-2 tablets (50-100 mg total) by mouth every 6 (six) hours as needed for moderate pain or severe pain. Post-operatively. 10/10/17 10/10/18 Yes Sebastian Ache, MD    Family History Family History  Problem Relation Age of Onset  . Hypertension Other     Social History Social History   Tobacco Use  . Smoking status: Current Every Day Smoker    Packs/day: 0.50    Years: 5.00    Pack years: 2.50    Types: Cigarettes  . Smokeless tobacco: Never Used  . Tobacco comment: cutting back  Substance Use Topics  . Alcohol use: No    Alcohol/week: 0.0 oz  . Drug use: No     Allergies   Patient has no known  allergies.   Review of Systems Review of Systems  Constitutional: Positive for chills and fever. Negative for appetite change, diaphoresis and fatigue.  HENT: Negative for mouth sores, sore throat and trouble swallowing.   Eyes: Negative for visual disturbance.  Respiratory: Negative for cough, chest tightness, shortness of breath and wheezing.   Cardiovascular: Positive for chest pain.  Gastrointestinal: Negative for abdominal distention, abdominal pain, diarrhea, nausea and vomiting.  Endocrine: Negative for polydipsia, polyphagia and polyuria.  Genitourinary: Negative for dysuria, frequency and hematuria.  Musculoskeletal: Negative for gait problem.  Skin: Positive for rash. Negative for color change and pallor.  Neurological: Negative for dizziness, syncope, light-headedness and headaches.       Mild headache. No neck pain. No rash.  Hematological: Does not bruise/bleed easily.  Psychiatric/Behavioral: Negative for behavioral problems and confusion.     Physical Exam Updated Vital Signs BP 92/66   Pulse (!) 106   Temp (!) 101.3 F (38.5 C) (Oral)   Resp (!) 29   Ht 6\' 1"  (1.854 m)   Wt 81.6 kg (180 lb)   SpO2 99%   BMI 23.75 kg/m   Physical Exam  Constitutional: He is oriented to person, place, and time. He appears well-developed and well-nourished. No distress.  Awake and alert. Answers questions appropriately. Does not appear distressed.  HENT:  Head: Normocephalic.  Eyes: Conjunctivae are normal. Pupils are equal, round, and reactive to light. No scleral icterus.  Neck: Normal range of motion. Neck supple. No thyromegaly present.  VP shunt catheter palpable. No soft tissue swelling or erythema in the neck. He is not tender along the course of the VP shunt catheter from his parietal scalp, to his postauricular scalp, to his neck. No meningismus. He can fully flex and extend the neck without pain or limitations.  Cardiovascular: Normal rate and regular rhythm. Exam  reveals no gallop and no friction rub.  No murmur heard. Sinus tachycardia  Pulmonary/Chest: Effort normal and breath sounds normal. No respiratory distress. He has no wheezes. He has no rales.  Effort less tachypnea. Clear bilateral breath sounds. Erythema and tenderness along the course of the VP shunt anterior chest.  Abdominal: Soft. Bowel sounds are normal. He exhibits no distension. There is no tenderness. There is no rebound.  He is tender along the course of the VP catheter to the xiphoid area. The remainder of his abdomen is nontender.  Musculoskeletal: Normal range of motion.  Neurological: He is alert and oriented to person, place, and time.  No meningismus. He is not encephalopathic or confused. Nonfocal neurological exam.  Skin:  Skin is warm and dry. No rash noted.  Psychiatric: He has a normal mood and affect. His behavior is normal.     ED Treatments / Results  Labs (all labs ordered are listed, but only abnormal results are displayed) Labs Reviewed  COMPREHENSIVE METABOLIC PANEL - Abnormal; Notable for the following components:      Result Value   Sodium 132 (*)    Potassium 3.1 (*)    Chloride 98 (*)    Glucose, Bld 116 (*)    Creatinine, Ser 1.33 (*)    Total Protein 9.0 (*)    Albumin 3.1 (*)    AST 44 (*)    Total Bilirubin 1.8 (*)    All other components within normal limits  CBC WITH DIFFERENTIAL/PLATELET - Abnormal; Notable for the following components:   WBC 27.7 (*)    RBC 3.90 (*)    Hemoglobin 12.4 (*)    HCT 37.1 (*)    Platelets 403 (*)    Neutro Abs 23.9 (*)    Monocytes Absolute 3.0 (*)    All other components within normal limits  URINALYSIS, ROUTINE W REFLEX MICROSCOPIC - Abnormal; Notable for the following components:   Color, Urine AMBER (*)    APPearance HAZY (*)    Hgb urine dipstick SMALL (*)    Bilirubin Urine SMALL (*)    Protein, ur 100 (*)    Leukocytes, UA MODERATE (*)    Bacteria, UA RARE (*)    All other components within  normal limits  CULTURE, BLOOD (ROUTINE X 2)  CULTURE, BLOOD (ROUTINE X 2)  PROTIME-INR  I-STAT CG4 LACTIC ACID, ED  I-STAT CG4 LACTIC ACID, ED    EKG  EKG Interpretation None       Radiology Dg Chest 2 View  Result Date: 12/30/2017 CLINICAL DATA:  Pt here for chest pain. Had shunt placed in May that drains to stomach. Area to anterior chest and R lat neck is red and very tender to palpation. EXAM: CHEST  2 VIEW COMPARISON:  07/12/2017 FINDINGS: Patient has a right-sided ventriculoperitoneal shunt, its visualized course unremarkable. The heart size is normal. The lungs are free of focal consolidations and pleural effusions. IMPRESSION: No active cardiopulmonary disease. Electronically Signed   By: Norva Pavlov M.D.   On: 12/30/2017 19:36   Ct Head Wo Contrast  Result Date: 12/30/2017 CLINICAL DATA:  27 y/o M; history of cryptococcal meningitis post VP shunt. Febrile. Cellulitis along course of VP shunt catheter. Headache. EXAM: CT HEAD WITHOUT CONTRAST TECHNIQUE: Contiguous axial images were obtained from the base of the skull through the vertex without intravenous contrast. COMPARISON:  06/04/2017 CT head.  05/25/2017 MRI head. FINDINGS: Brain: Linear lucent track traversing right frontal lobe into right caudate nucleus head from prior catheter insertion. Right frontal approach ventriculostomy catheter tip is in frontal horn of right lateral ventricle. Small lucencies present in right posterior limb of internal capsule, left caudate head, and left occipital lobe correspond to lesions on prior MRI of the brain. No evidence for new large acute infarct, hemorrhage, or focal mass effect. No hydrocephalus. No effacement of basilar cisterns. Vascular: No hyperdense vessel or unexpected calcification. Skull: Resolution of postsurgical changes related to right frontal burr hole. No acute osseous abnormality. Sinuses/Orbits: No acute finding. Other: None. IMPRESSION: 1. No acute intracranial  abnormality identified. 2. Right frontal approach ventriculostomy catheter with tip in the frontal horn of right lateral ventricle. No hydrocephalus. 3. Small lucent foci are present within the right  posterior limb of internal capsule, left caudate head, and left occipital cortex corresponding to lesions on prior MRI of the brain likely representing postinflammatory sequelae. Electronically Signed   By: Mitzi Hansen M.D.   On: 12/30/2017 23:08   Ct Soft Tissue Neck W Contrast  Result Date: 12/30/2017 CLINICAL DATA:  History of cryptococcal meningitis status post VP shunt. Cellulitis along the course of the shunt with soft tissue swelling in the lower neck. Febrile. EXAM: CT NECK WITH CONTRAST TECHNIQUE: Multidetector CT imaging of the neck was performed using the standard protocol following the bolus administration of intravenous contrast. CONTRAST:  ISOVUE-300 IOPAMIDOL (ISOVUE-300) INJECTION 61% COMPARISON:  Neck CT 09/15/2016 FINDINGS: Pharynx and larynx: Symmetric pharyngeal soft tissues without evidence of mass. Fluid collections involving the floor of mouth, oropharynx, narrowed nasopharynx, and soft palate on the prior neck CT have resolved. The larynx is unremarkable. Salivary glands: No inflammation, mass, or stone. Thyroid: Unremarkable. Lymph nodes: No enlarged or suspicious lymph nodes in the neck. Vascular: Major vascular structures of the neck appear patent. Limited intracranial: Small amount of focal low-density partially visualized involving anterior frontal cortex bilaterally and possibly cerebellum which may reflect sequelae of prior infection or infarct. Visualized orbits: Unremarkable. Mastoids and visualized paranasal sinuses: Clear. Skeleton: No acute osseous abnormality or suspicious osseous lesion. Upper chest: Clear lung apices. Other: The patient's VP shunt catheter courses through the right lateral and anterior lower neck and into the upper chest wall. No fluid  collection is identified along the catheter tract. IMPRESSION: No fluid collection identified along the VP shunt catheter. Electronically Signed   By: Sebastian Ache M.D.   On: 12/30/2017 22:06   Ct Abdomen Pelvis W Contrast  Result Date: 12/30/2017 CLINICAL DATA:  VP shunt infection from abdomen to chest. Rule out intra-abdominal pseudocyst. EXAM: CT ABDOMEN AND PELVIS WITH CONTRAST TECHNIQUE: Multidetector CT imaging of the abdomen and pelvis was performed using the standard protocol following bolus administration of intravenous contrast. CONTRAST:  100 cc Isovue-300 IV COMPARISON:  Abdominal CT 03/03/2017 FINDINGS: Lower chest: The lung bases are clear. Hepatobiliary: No focal liver abnormality is seen. No gallstones, gallbladder wall thickening, or biliary dilatation. Pancreas: No ductal dilatation or inflammation. Spleen: Normal in size without focal abnormality. Adrenals/Urinary Tract: Adrenal glands are unremarkable. Kidneys are normal, without renal calculi, focal lesion, or hydronephrosis. Bladder is decompressed by suprapubic tube. Stomach/Bowel: The stomach is nondistended. Lack of enteric contrast limits bowel evaluation. Sigmoid colon is tortuous. Possible colonic wall thickening the descending/sigmoid colonic junction for example image 84 series 8. No evidence of bowel obstruction. Liquid stool in the ascending colon without colonic inflammation or wall thickening. Vascular/Lymphatic: Shotty retroperitoneal nodes left periaortic station. Small mesenteric nodes. No acute vascular abnormality. Reproductive: Prostate is unremarkable. Other: Ventriculoperitoneal shunt catheter coursing in the right lower chest and to the right of the midline in the abdomen. There is stranding and small amount of fluid surrounding the shunt catheter in the subcutaneous tissues. Small amount of fluid about the pelvic portion of shunt catheter tubing best appreciated on coronal and sagittal reformats image 65 and 1 and 3  respectively. Tip of the shunt in the left pelvis without focal fluid collection at the tip. Trace free fluid in the left pericolic gutter and scattered throughout the mesentery. Musculoskeletal: There are no acute or suspicious osseous abnormalities. IMPRESSION: 1. Stranding and fluid about the subcutaneous portion ventriculoperitoneal shunt in the right lower chest and abdomen. Fluid about the intrapelvic portion of the shunt. Infection is  considered. 2. Possible bowel wall thickening at the descending sigmoid colonic junction in the region of peritoneal catheter shunt fluid, may be reactive or infectious/inflammatory. 3. Suprapubic tube decompressing the urinary bladder. Electronically Signed   By: Rubye OaksMelanie  Ehinger M.D.   On: 12/30/2017 22:59    Procedures Procedures (including critical care time)  Medications Ordered in ED Medications  cefTRIAXone (ROCEPHIN) 2 g in dextrose 5 % 50 mL IVPB (not administered)  vancomycin (VANCOCIN) 1,500 mg in sodium chloride 0.9 % 500 mL IVPB (not administered)  potassium chloride 10 mEq in 100 mL IVPB (not administered)  acetaminophen (TYLENOL) tablet 1,000 mg (not administered)  acetaminophen (TYLENOL) tablet 650 mg (650 mg Oral Given 12/30/17 1820)  iopamidol (ISOVUE-300) 61 % injection (100 mLs Intravenous Contrast Given 12/30/17 2051)  sodium chloride 0.9 % bolus 2,000 mL (2,000 mLs Intravenous New Bag/Given 12/30/17 2330)     Initial Impression / Assessment and Plan / ED Course  I have reviewed the triage vital signs and the nursing notes.  Pertinent labs & imaging results that were available during my care of the patient were reviewed by me and considered in my medical decision making (see chart for details).  Clinical Course as of Dec 30 2338  Wynelle LinkSun Dec 30, 2017  2337 CT Head Wo Contrast [MJ]    Clinical Course User Index [MJ] Rolland PorterJames, Koriana Stepien, MD   Extensive evaluation and multiple consultations. I initially reviewed the case with Dr. Lezlie LyeStearns of  neurosurgery. Studies recommended. We agreed to speak about the patient after completion of evaluation. I also discussed the case with Dr. Ronnie Derbyromer of infectious disease. He felt that if the patient was going to have spinal fluid obtained or his shunt externalized banana bike should be held until this can be obtained and spinal fluid culture. It is not going to happen tonight, he was agreeable with antibiotics and they will follow the patient in the hospital.  CT scan head and neck:  No acute abnormalities of the brain. Course of the VP catheter through the neck appears without abnormality or surrounding fluid collection.  CT scan abdomen and pelvis. Inflammatory changes and fluid collection noted in the subcutaneous track in the lower chest and upper abdomen. Some fluid within the pericolic gutter. Some fluid along the course of the VP shunt. There is some minimal thickening of the sigmoid colon without adjacent inflammatory stranding. There is no fluid at the tip of the catheter in the pelvis.  I discussed the case again with Dr. Venetia MaxonStern. He felt that the patient does not require externalization of his VP shunt tonight. He felt that this is likely a infection limited to the subcutaneous course of the catheter and the patient's subcutaneous chest. His exam and studies are certainly consistent with this. On reexam he does not have tenderness of his abdomen. His lactate is normal. He remains tachycardic and febrile. Was given additional fluids. Giving some additional fluids and Tylenol now. We'll treat with vancomycin. He has a supra pubic catheter. Urine shows not unexpected cellularity. Culture pending and will have her with Rocephin.  Anticipate continued consultation with infectious disease neurosurgery. We'll discussed with hospitalist regarding admission.  Final Clinical Impressions(s) / ED Diagnoses   Final diagnoses:  Cellulitis of chest wall    ED Discharge Orders    None       Rolland PorterJames, Medina Degraffenreid,  MD 12/30/17 2340

## 2017-12-30 NOTE — ED Triage Notes (Signed)
Pt here for chest pain.  Had shunt placed in May that drains to stomach.  Area to anterior chest and R lat neck is red and very tender to palpation.  Pt febrile.

## 2017-12-31 ENCOUNTER — Telehealth: Payer: Self-pay | Admitting: *Deleted

## 2017-12-31 DIAGNOSIS — L03311 Cellulitis of abdominal wall: Secondary | ICD-10-CM | POA: Diagnosis present

## 2017-12-31 DIAGNOSIS — R509 Fever, unspecified: Secondary | ICD-10-CM | POA: Diagnosis not present

## 2017-12-31 DIAGNOSIS — B20 Human immunodeficiency virus [HIV] disease: Secondary | ICD-10-CM | POA: Diagnosis present

## 2017-12-31 DIAGNOSIS — Y831 Surgical operation with implant of artificial internal device as the cause of abnormal reaction of the patient, or of later complication, without mention of misadventure at the time of the procedure: Secondary | ICD-10-CM | POA: Diagnosis present

## 2017-12-31 DIAGNOSIS — Z881 Allergy status to other antibiotic agents status: Secondary | ICD-10-CM | POA: Diagnosis not present

## 2017-12-31 DIAGNOSIS — Z982 Presence of cerebrospinal fluid drainage device: Secondary | ICD-10-CM

## 2017-12-31 DIAGNOSIS — K651 Peritoneal abscess: Secondary | ICD-10-CM | POA: Diagnosis not present

## 2017-12-31 DIAGNOSIS — Z1611 Resistance to penicillins: Secondary | ICD-10-CM | POA: Diagnosis not present

## 2017-12-31 DIAGNOSIS — R7881 Bacteremia: Secondary | ICD-10-CM | POA: Diagnosis not present

## 2017-12-31 DIAGNOSIS — K592 Neurogenic bowel, not elsewhere classified: Secondary | ICD-10-CM | POA: Diagnosis present

## 2017-12-31 DIAGNOSIS — L0390 Cellulitis, unspecified: Secondary | ICD-10-CM | POA: Diagnosis present

## 2017-12-31 DIAGNOSIS — F17210 Nicotine dependence, cigarettes, uncomplicated: Secondary | ICD-10-CM | POA: Diagnosis present

## 2017-12-31 DIAGNOSIS — Z8619 Personal history of other infectious and parasitic diseases: Secondary | ICD-10-CM | POA: Diagnosis not present

## 2017-12-31 DIAGNOSIS — K567 Ileus, unspecified: Secondary | ICD-10-CM | POA: Diagnosis not present

## 2017-12-31 DIAGNOSIS — F419 Anxiety disorder, unspecified: Secondary | ICD-10-CM | POA: Diagnosis present

## 2017-12-31 DIAGNOSIS — Y838 Other surgical procedures as the cause of abnormal reaction of the patient, or of later complication, without mention of misadventure at the time of the procedure: Secondary | ICD-10-CM | POA: Diagnosis not present

## 2017-12-31 DIAGNOSIS — G40909 Epilepsy, unspecified, not intractable, without status epilepticus: Secondary | ICD-10-CM | POA: Diagnosis present

## 2017-12-31 DIAGNOSIS — Z79891 Long term (current) use of opiate analgesic: Secondary | ICD-10-CM | POA: Diagnosis not present

## 2017-12-31 DIAGNOSIS — D013 Carcinoma in situ of anus and anal canal: Secondary | ICD-10-CM | POA: Diagnosis present

## 2017-12-31 DIAGNOSIS — F319 Bipolar disorder, unspecified: Secondary | ICD-10-CM | POA: Diagnosis present

## 2017-12-31 DIAGNOSIS — Z888 Allergy status to other drugs, medicaments and biological substances status: Secondary | ICD-10-CM | POA: Diagnosis not present

## 2017-12-31 DIAGNOSIS — I69311 Memory deficit following cerebral infarction: Secondary | ICD-10-CM | POA: Diagnosis not present

## 2017-12-31 DIAGNOSIS — E876 Hypokalemia: Secondary | ICD-10-CM | POA: Diagnosis not present

## 2017-12-31 DIAGNOSIS — Z8249 Family history of ischemic heart disease and other diseases of the circulatory system: Secondary | ICD-10-CM | POA: Diagnosis not present

## 2017-12-31 DIAGNOSIS — Z21 Asymptomatic human immunodeficiency virus [HIV] infection status: Secondary | ICD-10-CM | POA: Diagnosis not present

## 2017-12-31 DIAGNOSIS — B1910 Unspecified viral hepatitis B without hepatic coma: Secondary | ICD-10-CM | POA: Diagnosis not present

## 2017-12-31 DIAGNOSIS — L03313 Cellulitis of chest wall: Secondary | ICD-10-CM | POA: Diagnosis present

## 2017-12-31 DIAGNOSIS — Z960 Presence of urogenital implants: Secondary | ICD-10-CM | POA: Diagnosis not present

## 2017-12-31 DIAGNOSIS — G4489 Other headache syndrome: Secondary | ICD-10-CM | POA: Diagnosis not present

## 2017-12-31 DIAGNOSIS — B180 Chronic viral hepatitis B with delta-agent: Secondary | ICD-10-CM

## 2017-12-31 DIAGNOSIS — Z95828 Presence of other vascular implants and grafts: Secondary | ICD-10-CM | POA: Diagnosis not present

## 2017-12-31 DIAGNOSIS — N319 Neuromuscular dysfunction of bladder, unspecified: Secondary | ICD-10-CM | POA: Diagnosis present

## 2017-12-31 DIAGNOSIS — A411 Sepsis due to other specified staphylococcus: Secondary | ICD-10-CM | POA: Diagnosis present

## 2017-12-31 DIAGNOSIS — Z8661 Personal history of infections of the central nervous system: Secondary | ICD-10-CM | POA: Diagnosis not present

## 2017-12-31 DIAGNOSIS — Z79899 Other long term (current) drug therapy: Secondary | ICD-10-CM | POA: Diagnosis not present

## 2017-12-31 DIAGNOSIS — B9689 Other specified bacterial agents as the cause of diseases classified elsewhere: Secondary | ICD-10-CM | POA: Diagnosis not present

## 2017-12-31 DIAGNOSIS — B451 Cerebral cryptococcosis: Secondary | ICD-10-CM | POA: Diagnosis not present

## 2017-12-31 DIAGNOSIS — L02211 Cutaneous abscess of abdominal wall: Secondary | ICD-10-CM | POA: Diagnosis not present

## 2017-12-31 DIAGNOSIS — B957 Other staphylococcus as the cause of diseases classified elsewhere: Secondary | ICD-10-CM | POA: Diagnosis present

## 2017-12-31 DIAGNOSIS — T85730A Infection and inflammatory reaction due to ventricular intracranial (communicating) shunt, initial encounter: Secondary | ICD-10-CM | POA: Diagnosis present

## 2017-12-31 DIAGNOSIS — R188 Other ascites: Secondary | ICD-10-CM | POA: Diagnosis not present

## 2017-12-31 DIAGNOSIS — F3170 Bipolar disorder, currently in remission, most recent episode unspecified: Secondary | ICD-10-CM | POA: Diagnosis not present

## 2017-12-31 DIAGNOSIS — T85730D Infection and inflammatory reaction due to ventricular intracranial (communicating) shunt, subsequent encounter: Secondary | ICD-10-CM | POA: Diagnosis not present

## 2017-12-31 LAB — BLOOD CULTURE ID PANEL (REFLEXED)
Acinetobacter baumannii: NOT DETECTED
CANDIDA GLABRATA: NOT DETECTED
CANDIDA KRUSEI: NOT DETECTED
CANDIDA TROPICALIS: NOT DETECTED
Candida albicans: NOT DETECTED
Candida parapsilosis: NOT DETECTED
ENTEROBACTER CLOACAE COMPLEX: NOT DETECTED
ESCHERICHIA COLI: NOT DETECTED
Enterobacteriaceae species: NOT DETECTED
Enterococcus species: NOT DETECTED
Haemophilus influenzae: NOT DETECTED
KLEBSIELLA OXYTOCA: NOT DETECTED
KLEBSIELLA PNEUMONIAE: NOT DETECTED
LISTERIA MONOCYTOGENES: NOT DETECTED
Methicillin resistance: DETECTED — AB
Neisseria meningitidis: NOT DETECTED
PROTEUS SPECIES: NOT DETECTED
Pseudomonas aeruginosa: NOT DETECTED
STAPHYLOCOCCUS SPECIES: DETECTED — AB
STREPTOCOCCUS AGALACTIAE: NOT DETECTED
Serratia marcescens: NOT DETECTED
Staphylococcus aureus (BCID): NOT DETECTED
Streptococcus pneumoniae: NOT DETECTED
Streptococcus pyogenes: NOT DETECTED
Streptococcus species: NOT DETECTED

## 2017-12-31 LAB — BASIC METABOLIC PANEL
ANION GAP: 10 (ref 5–15)
BUN: 7 mg/dL (ref 6–20)
CO2: 20 mmol/L — AB (ref 22–32)
Calcium: 8.1 mg/dL — ABNORMAL LOW (ref 8.9–10.3)
Chloride: 105 mmol/L (ref 101–111)
Creatinine, Ser: 1.21 mg/dL (ref 0.61–1.24)
GFR calc Af Amer: >60 mL/min (ref >60)
GFR calc non Af Amer: >60 mL/min (ref >60)
GLUCOSE: 98 mg/dL (ref 65–99)
POTASSIUM: 2.8 mmol/L — AB (ref 3.5–5.1)
Sodium: 135 mmol/L (ref 135–145)

## 2017-12-31 LAB — CBC
HCT: 35.7 % — ABNORMAL LOW (ref 39.0–52.0)
HEMOGLOBIN: 12.3 g/dL — AB (ref 13.0–17.0)
MCH: 32.6 pg (ref 26.0–34.0)
MCHC: 34.5 g/dL (ref 30.0–36.0)
MCV: 94.7 fL (ref 78.0–100.0)
Platelets: 360 10*3/uL (ref 150–400)
RBC: 3.77 MIL/uL — ABNORMAL LOW (ref 4.22–5.81)
RDW: 13.9 % (ref 11.5–15.5)
WBC: 29.5 10*3/uL — ABNORMAL HIGH (ref 4.0–10.5)

## 2017-12-31 LAB — C-REACTIVE PROTEIN: CRP: 30.6 mg/dL — AB (ref <1.0)

## 2017-12-31 LAB — MRSA PCR SCREENING: MRSA by PCR: NEGATIVE

## 2017-12-31 MED ORDER — ACETAMINOPHEN 325 MG PO TABS
650.0000 mg | ORAL_TABLET | ORAL | Status: DC | PRN
  Administered 2017-12-31 – 2018-01-12 (×9): 650 mg via ORAL
  Filled 2017-12-31 (×9): qty 2

## 2017-12-31 MED ORDER — DEXTROSE 5 % IV SOLN
2.0000 g | Freq: Three times a day (TID) | INTRAVENOUS | Status: DC
  Administered 2017-12-31 – 2018-01-01 (×3): 2 g via INTRAVENOUS
  Filled 2017-12-31 (×4): qty 2

## 2017-12-31 MED ORDER — ONDANSETRON HCL 4 MG PO TABS: 4.0000 mg | ORAL_TABLET | Freq: Four times a day (QID) | ORAL | Status: DC | PRN

## 2017-12-31 MED ORDER — TRAMADOL HCL 50 MG PO TABS
50.0000 mg | ORAL_TABLET | Freq: Four times a day (QID) | ORAL | Status: DC | PRN
  Administered 2017-12-31 – 2018-01-01 (×3): 100 mg via ORAL
  Filled 2017-12-31 (×4): qty 2

## 2017-12-31 MED ORDER — SODIUM CHLORIDE 0.9 % IV BOLUS (SEPSIS)
500.0000 mL | Freq: Once | INTRAVENOUS | Status: AC
  Administered 2017-12-31: 500 mL via INTRAVENOUS

## 2017-12-31 MED ORDER — DEXTROSE 5 % IV SOLN: 2.0000 g | INTRAVENOUS | Status: DC

## 2017-12-31 MED ORDER — DOLUTEGRAVIR SODIUM 50 MG PO TABS
50.0000 mg | ORAL_TABLET | Freq: Every day | ORAL | Status: DC
  Administered 2017-12-31 – 2018-01-17 (×17): 50 mg via ORAL
  Filled 2017-12-31 (×18): qty 1

## 2017-12-31 MED ORDER — FLUCONAZOLE 200 MG PO TABS
400.0000 mg | ORAL_TABLET | Freq: Every day | ORAL | Status: DC
  Administered 2017-12-31: 400 mg via ORAL
  Filled 2017-12-31 (×2): qty 2

## 2017-12-31 MED ORDER — VANCOMYCIN HCL IN DEXTROSE 1-5 GM/200ML-% IV SOLN
1000.0000 mg | Freq: Three times a day (TID) | INTRAVENOUS | Status: DC
  Administered 2017-12-31 – 2018-01-02 (×4): 1000 mg via INTRAVENOUS
  Filled 2017-12-31 (×8): qty 200

## 2017-12-31 MED ORDER — POTASSIUM CHLORIDE CRYS ER 20 MEQ PO TBCR
40.0000 meq | EXTENDED_RELEASE_TABLET | Freq: Two times a day (BID) | ORAL | Status: AC
  Administered 2017-12-31 (×2): 40 meq via ORAL
  Filled 2017-12-31: qty 2

## 2017-12-31 MED ORDER — SODIUM CHLORIDE 0.9 % IV SOLN
INTRAVENOUS | Status: DC
  Administered 2017-12-31 – 2018-01-05 (×11): via INTRAVENOUS

## 2017-12-31 MED ORDER — LEVETIRACETAM 500 MG PO TABS
1000.0000 mg | ORAL_TABLET | Freq: Two times a day (BID) | ORAL | Status: DC
  Administered 2017-12-31 (×3): 1000 mg via ORAL
  Filled 2017-12-31 (×3): qty 2

## 2017-12-31 MED ORDER — POTASSIUM CHLORIDE 10 MEQ/100ML IV SOLN
10.0000 meq | INTRAVENOUS | Status: AC
  Administered 2017-12-31 (×3): 10 meq via INTRAVENOUS
  Filled 2017-12-31 (×3): qty 100

## 2017-12-31 MED ORDER — QUETIAPINE FUMARATE 25 MG PO TABS
25.0000 mg | ORAL_TABLET | Freq: Two times a day (BID) | ORAL | Status: DC | PRN
  Administered 2018-01-07 – 2018-01-13 (×4): 25 mg via ORAL
  Filled 2017-12-31 (×6): qty 1

## 2017-12-31 MED ORDER — VANCOMYCIN HCL IN DEXTROSE 1-5 GM/200ML-% IV SOLN
1000.0000 mg | Freq: Two times a day (BID) | INTRAVENOUS | Status: DC
  Administered 2017-12-31: 1000 mg via INTRAVENOUS
  Filled 2017-12-31 (×2): qty 200

## 2017-12-31 MED ORDER — ONDANSETRON HCL 4 MG/2ML IJ SOLN
4.0000 mg | Freq: Four times a day (QID) | INTRAMUSCULAR | Status: DC | PRN
  Administered 2017-12-31 – 2018-01-16 (×9): 4 mg via INTRAVENOUS
  Filled 2017-12-31 (×9): qty 2

## 2017-12-31 MED ORDER — POTASSIUM CHLORIDE IN NACL 20-0.9 MEQ/L-% IV SOLN
INTRAVENOUS | Status: DC
  Administered 2017-12-31: 04:00:00 via INTRAVENOUS
  Filled 2017-12-31: qty 1000

## 2017-12-31 MED ORDER — SULFAMETHOXAZOLE-TRIMETHOPRIM 800-160 MG PO TABS
1.0000 | ORAL_TABLET | ORAL | Status: DC
  Administered 2018-01-02 – 2018-01-16 (×7): 1 via ORAL
  Filled 2017-12-31 (×7): qty 1

## 2017-12-31 MED ORDER — ACETAMINOPHEN 325 MG PO TABS: 650.0000 mg | ORAL_TABLET | Freq: Four times a day (QID) | ORAL | Status: DC | PRN

## 2017-12-31 MED ORDER — LOPERAMIDE HCL 2 MG PO CAPS
2.0000 mg | ORAL_CAPSULE | ORAL | Status: DC | PRN
  Administered 2017-12-31: 2 mg via ORAL
  Filled 2017-12-31: qty 1

## 2017-12-31 MED ORDER — SULFAMETHOXAZOLE-TRIMETHOPRIM 800-160 MG PO TABS
1.0000 | ORAL_TABLET | Freq: Two times a day (BID) | ORAL | Status: DC
  Administered 2017-12-31 (×2): 1 via ORAL
  Filled 2017-12-31 (×2): qty 1

## 2017-12-31 MED ORDER — ACETAMINOPHEN 650 MG RE SUPP
650.0000 mg | Freq: Four times a day (QID) | RECTAL | Status: DC | PRN
Start: 2017-12-31 — End: 2017-12-31

## 2017-12-31 MED ORDER — EMTRICITAB-RILPIVIR-TENOFOV AF 200-25-25 MG PO TABS
1.0000 | ORAL_TABLET | Freq: Every day | ORAL | Status: DC
  Administered 2017-12-31 – 2018-01-17 (×17): 1 via ORAL
  Filled 2017-12-31 (×18): qty 1

## 2017-12-31 NOTE — H&P (Signed)
History and Physical    RAJEEV ESCUE ZOX:[id number] DOB: 15-Apr-1991 DOA: 12/30/2017  PCP: Randall Hiss, MD  Patient coming from:  home  Chief Complaint:   Fever, rash  HPI: Justin Ball is a 27 y.o. 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  He was placed on bactrim which he has taken 3 doses.  The rash has gotten worse and is tracking up along where his shunt is under his skin along his chest wall.  Pt came in for fever and chills.  Denies any urinary symptoms.  No cough.  No sob.  Pt referred for admission for cellulitis associated with his vp shunt.  Both ID and neurosurg called and will both see in the am.  Review of Systems: As per HPI otherwise 10 point review of systems negative.   Past Medical History:  Diagnosis Date  . 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 Anxiety   . 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 Bipolar 1 disorder (HCC)   . 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 Cognitive communication deficit    working w/ therapy  . 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 Depression   . 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 Fecal incontinence 11/19/2017  . Foley catheter in place   . 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 History of scabies 2013  . History of syphilis   . Memory deficit after cerebral infarction    due to infection    . 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 Neurogenic bowel    wears depends  . Neuromuscular disorder (HCC)   . 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 Seizure disorder [facility])    neurologist-  dr Marjory Lies ([facility])--caused by cryptococcal meningitis/ multiple infarctions--  no seizure since hospitalization May 2018  . Status epilepticus (HCC)   . Visuospatial deficit     Past Surgical History:  Procedure Laterality Date  . CYSTOSCOPY N/A 10/10/2017   Procedure: CYSTOSCOPY;  Surgeon: Sebastian Ache, MD;  Location: [facility];  Service: Urology;  Laterality: N/A;  . EXAMINATION UNDER ANESTHESIA N/A 12/28/2014   Procedure: EXAM UNDER ANESTHESIA;  Surgeon: Karie Soda, MD;  Location: [facility];  Service: General;  Laterality: N/A;  . FLOOR OF MOUTH BIOPSY N/A 09/16/2016   Procedure: BIOPSY OF ORAL ABCESS;  Surgeon: Newman Pies, MD;  Location: [facility];  Service: ENT;  Laterality: N/A;  . INCISION AND DRAINAGE ABSCESS N/A 09/16/2016   Procedure: INCISION AND DRAINAGE ABSCESS;  Surgeon: Newman Pies, MD;  Location: [facility];  Service: ENT;  Laterality: N/A;  . INCISION AND DRAINAGE PERIRECTAL ABSCESS  09/ 03/ 2011   dr toth  . INCISION AND DRAINAGE PERIRECTAL ABSCESS N/A 12/28/2014   Procedure: IRRIGATION AND DEBRIDEMENT PERIRECTAL ABSCESS;  Surgeon: Karie Soda, MD;  Location: [facility];  Service: General;  Laterality:  N/A;  Fistula repair and ablation  . INSERTION OF SUPRAPUBIC CATHETER N/A 10/10/2017   Procedure: INSERTION OF SUPRAPUBIC CATHETER;  Surgeon: Sebastian Ache, MD;  Location: [facility];  Service: Urology;  Laterality: N/A;  . LAPAROSCOPIC REVISION VENTRICULAR-PERITONEAL (V-P) SHUNT N/A 06/12/2017   Procedure: LAPAROSCOPIC REVISION VENTRICULAR-PERITONEAL (V-P) SHUNT;  Surgeon: Kinsinger, De Blanch, MD;  Location: [facility];  Service: General;  Laterality: N/A;  . TRANSTHORACIC ECHOCARDIOGRAM  06/02/2017   EF 65-70%/  trivial PR  .  VENTRICULOPERITONEAL SHUNT N/A 06/12/2017   Procedure: SHUNT INSERTION VENTRICULAR-PERITONEAL;  Surgeon: [name], MD;  Location: [facility];  Service: Neurosurgery;  Laterality: N/A;     reports that he has been smoking cigarettes.  He has a 2.50 pack-year smoking history. he has never used smokeless tobacco. He reports that he does not drink alcohol or use drugs.  No Known Allergies  Family History  Problem Relation Age of Onset  . Hypertension Other     Prior to Admission medications   Medication Sig Start Date End Date Taking? Authorizing Provider  acetaminophen (TYLENOL) 325 MG tablet Take 2 tablets (650 mg total) by mouth every 4 (four) hours as needed for mild pain (temp > 101.5). 06/28/17  Yes Cliffton Asters, MD  dolutegravir (TIVICAY) 50 MG tablet Take 1 tablet (50 mg total) by mouth daily. Patient taking differently: Take 50 mg by mouth every morning.  07/20/17  Yes [name], Lisette Grinder, MD  emtricitabine-rilpivir-tenofovir AF (ODEFSEY) 200-25-25 MG TABS tablet Take 1 tablet by mouth daily with breakfast. Patient taking differently: Take 1 tablet by mouth daily with breakfast.  07/20/17  Yes [name], Lisette Grinder, MD  levETIRAcetam (KEPPRA) 1000 MG tablet Take 1 tablet (1,000 mg total) by mouth 2 (two) times daily. Patient taking differently: Take 1,000 mg by mouth 2 (two) times daily.  07/20/17  Yes [name], Lisette Grinder, MD  QUEtiapine (SEROQUEL) 25 MG tablet Take 1 tablet (25 mg total) by mouth 2 (two) times daily as needed (agitation). Patient taking differently: Take 25 mg by mouth 2 (two) times daily.  07/20/17  Yes [name], Lisette Grinder, MD  sulfamethoxazole-trimethoprim (BACTRIM DS,SEPTRA DS) 800-160 MG tablet Take 1 tablet by mouth 2 (two) times daily. 12/25/17  Yes [name], MD  traMADol (ULTRAM) 50 MG tablet Take 1-2 tablets (50-100 mg total) by mouth every 6 (six) hours as needed for moderate pain or severe pain. Post-operatively. 10/10/17 10/10/18 Yes Sebastian Ache, MD     Physical Exam: Vitals:   12/30/17 2330 12/30/17 2358 12/31/17 0000 12/31/17 0030  BP: 109/72  (!) 115/94 (!) 94/51  Pulse: (!) 124  (!) 123 (!) 124  Resp: (!) 32  (!) 36 (!) 32  Temp:  (!) 103.2 F (39.6 C)    TempSrc:  Oral    SpO2: 99%  100% 99%  Weight:      Height:          Constitutional: NAD, calm, comfortable Vitals:   12/30/17 2330 12/30/17 2358 12/31/17 0000 12/31/17 0030  BP: 109/72  (!) 115/94 (!) 94/51  Pulse: (!) 124  (!) 123 (!) 124  Resp: (!) 32  (!) 36 (!) 32  Temp:  (!) 103.2 F (39.6 C)    TempSrc:  Oral    SpO2: 99%  100% 99%  Weight:      Height:       Eyes: PERRL, lids and conjunctivae normal ENMT: Mucous membranes are moist. Posterior pharynx clear of any exudate or  lesions.Normal dentition.  Neck: normal, supple, no masses, no thyromegaly Respiratory: clear to auscultation bilaterally, no wheezing, no crackles. Normal respiratory effort. No accessory muscle use.  Cardiovascular: Regular rate and rhythm, no murmurs / rubs / gallops. No extremity edema. 2+ pedal pulses. No carotid bruits.  Abdomen: no tenderness, no masses palpated. No hepatosplenomegaly. Bowel sounds positive.  Musculoskeletal: no clubbing / cyanosis. No joint deformity upper and lower extremities. Good ROM, no contractures. Normal muscle tone.  Skin: rash along his vp shunt from mid abd wall up to upper chest with clear deliniation of erythema in a line Neurologic: CN 2-12 grossly intact. Sensation intact, DTR normal. Strength 5/5 in all 4.  Psychiatric: Normal judgment and insight. Alert and oriented x 3. Normal mood.    Labs on Admission: I have personally reviewed following labs and imaging studies  CBC: Recent Labs  Lab 12/30/17 1830  WBC 27.7*  NEUTROABS 23.9*  HGB 12.4*  HCT 37.1*  MCV 95.1  PLT 403*   Basic Metabolic Panel: Recent Labs  Lab 12/30/17 1830  NA 132*  K 3.1*  CL 98*  CO2 23  GLUCOSE 116*  BUN 6  CREATININE 1.33*  CALCIUM 8.9    GFR: Estimated Creatinine Clearance: 95.1 mL/min (A) (by C-G formula based on SCr of 1.33 mg/dL (H)). Liver Function Tests: Recent Labs  Lab 12/30/17 1830  AST 44*  ALT 35  ALKPHOS 96  BILITOT 1.8*  PROT 9.0*  ALBUMIN 3.1*   No results for input(s): LIPASE, AMYLASE in the last 168 hours. No results for input(s): AMMONIA in the last 168 hours. Coagulation Profile: Recent Labs  Lab 12/30/17 1830  INR 1.14   Cardiac Enzymes: No results for input(s): CKTOTAL, CKMB, CKMBINDEX, TROPONINI in the last 168 hours. BNP (last 3 results) No results for input(s): PROBNP in the last 8760 hours. HbA1C: No results for input(s): HGBA1C in the last 72 hours. CBG: No results for input(s): GLUCAP in the last 168 hours. Lipid Profile: No results for input(s): CHOL, HDL, LDLCALC, TRIG, CHOLHDL, LDLDIRECT in the last 72 hours. Thyroid Function Tests: No results for input(s): TSH, T4TOTAL, FREET4, T3FREE, THYROIDAB in the last 72 hours. Anemia Panel: No results for input(s): VITAMINB12, FOLATE, FERRITIN, TIBC, IRON, RETICCTPCT in the last 72 hours. Urine analysis:    Component Value Date/Time   COLORURINE AMBER (A) 12/30/2017 2119   APPEARANCEUR HAZY (A) 12/30/2017 2119   LABSPEC 1.028 12/30/2017 2119   PHURINE 5.0 12/30/2017 2119   GLUCOSEU NEGATIVE 12/30/2017 2119   HGBUR SMALL (A) 12/30/2017 2119   BILIRUBINUR SMALL (A) 12/30/2017 2119   KETONESUR NEGATIVE 12/30/2017 2119   PROTEINUR 100 (A) 12/30/2017 2119   UROBILINOGEN 4.0 (H) 09/09/2014 1055   NITRITE NEGATIVE 12/30/2017 2119   LEUKOCYTESUR MODERATE (A) 12/30/2017 2119   Sepsis Labs: !!!!!!!!!!!!!!!!!!!!!!!!!!!!!!!!!!!!!!!!!!!! @LABRCNTIP (procalcitonin:4,lacticidven:4) )No results found for this or any previous visit (from the past 240 hour(s)).   Radiological Exams on Admission: Dg Chest 2 View  Result Date: 12/30/2017 CLINICAL DATA:  Pt here for chest pain. Had shunt placed in May that drains to stomach. Area to anterior  chest and R lat neck is red and very tender to palpation. EXAM: CHEST  2 VIEW COMPARISON:  07/12/2017 FINDINGS: Patient has a right-sided ventriculoperitoneal shunt, its visualized course unremarkable. The heart size is normal. The lungs are free of focal consolidations and pleural effusions. IMPRESSION: No active cardiopulmonary disease. Electronically Signed   By: Norva Pavlov M.D.   On: 12/30/2017 19:36   Ct  Head Wo Contrast  Result Date: 12/30/2017 CLINICAL DATA:  27 y/o M; history of cryptococcal meningitis post VP shunt. Febrile. Cellulitis along course of VP shunt catheter. Headache. EXAM: CT HEAD WITHOUT CONTRAST TECHNIQUE: Contiguous axial images were obtained from the base of the skull through the vertex without intravenous contrast. COMPARISON:  06/04/2017 CT head.  05/25/2017 MRI head. FINDINGS: Brain: Linear lucent track traversing right frontal lobe into right caudate nucleus head from prior catheter insertion. Right frontal approach ventriculostomy catheter tip is in frontal horn of right lateral ventricle. Small lucencies present in right posterior limb of internal capsule, left caudate head, and left occipital lobe correspond to lesions on prior MRI of the brain. No evidence for new large acute infarct, hemorrhage, or focal mass effect. No hydrocephalus. No effacement of basilar cisterns. Vascular: No hyperdense vessel or unexpected calcification. Skull: Resolution of postsurgical changes related to right frontal burr hole. No acute osseous abnormality. Sinuses/Orbits: No acute finding. Other: None. IMPRESSION: 1. No acute intracranial abnormality identified. 2. Right frontal approach ventriculostomy catheter with tip in the frontal horn of right lateral ventricle. No hydrocephalus. 3. Small lucent foci are present within the right posterior limb of internal capsule, left caudate head, and left occipital cortex corresponding to lesions on prior MRI of the brain likely representing  postinflammatory sequelae. Electronically Signed   By: Mitzi Hansen M.D.   On: 12/30/2017 23:08   Ct Soft Tissue Neck W Contrast  Result Date: 12/30/2017 CLINICAL DATA:  History of cryptococcal meningitis status post VP shunt. Cellulitis along the course of the shunt with soft tissue swelling in the lower neck. Febrile. EXAM: CT NECK WITH CONTRAST TECHNIQUE: Multidetector CT imaging of the neck was performed using the standard protocol following the bolus administration of intravenous contrast. CONTRAST:  ISOVUE-300 IOPAMIDOL (ISOVUE-300) INJECTION 61% COMPARISON:  Neck CT 09/15/2016 FINDINGS: Pharynx and larynx: Symmetric pharyngeal soft tissues without evidence of mass. Fluid collections involving the floor of mouth, oropharynx, narrowed nasopharynx, and soft palate on the prior neck CT have resolved. The larynx is unremarkable. Salivary glands: No inflammation, mass, or stone. Thyroid: Unremarkable. Lymph nodes: No enlarged or suspicious lymph nodes in the neck. Vascular: Major vascular structures of the neck appear patent. Limited intracranial: Small amount of focal low-density partially visualized involving anterior frontal cortex bilaterally and possibly cerebellum which may reflect sequelae of prior infection or infarct. Visualized orbits: Unremarkable. Mastoids and visualized paranasal sinuses: Clear. Skeleton: No acute osseous abnormality or suspicious osseous lesion. Upper chest: Clear lung apices. Other: The patient's VP shunt catheter courses through the right lateral and anterior lower neck and into the upper chest wall. No fluid collection is identified along the catheter tract. IMPRESSION: No fluid collection identified along the VP shunt catheter. Electronically Signed   By: Sebastian Ache M.D.   On: 12/30/2017 22:06   Ct Abdomen Pelvis W Contrast  Result Date: 12/30/2017 CLINICAL DATA:  VP shunt infection from abdomen to chest. Rule out intra-abdominal pseudocyst. EXAM: CT  ABDOMEN AND PELVIS WITH CONTRAST TECHNIQUE: Multidetector CT imaging of the abdomen and pelvis was performed using the standard protocol following bolus administration of intravenous contrast. CONTRAST:  100 cc Isovue-300 IV COMPARISON:  Abdominal CT 03/03/2017 FINDINGS: Lower chest: The lung bases are clear. Hepatobiliary: No focal liver abnormality is seen. No gallstones, gallbladder wall thickening, or biliary dilatation. Pancreas: No ductal dilatation or inflammation. Spleen: Normal in size without focal abnormality. Adrenals/Urinary Tract: Adrenal glands are unremarkable. Kidneys are normal, without renal calculi, focal lesion,  or hydronephrosis. Bladder is decompressed by suprapubic tube. Stomach/Bowel: The stomach is nondistended. Lack of enteric contrast limits bowel evaluation. Sigmoid colon is tortuous. Possible colonic wall thickening the descending/sigmoid colonic junction for example image 84 series 8. No evidence of bowel obstruction. Liquid stool in the ascending colon without colonic inflammation or wall thickening. Vascular/Lymphatic: Shotty retroperitoneal nodes left periaortic station. Small mesenteric nodes. No acute vascular abnormality. Reproductive: Prostate is unremarkable. Other: Ventriculoperitoneal shunt catheter coursing in the right lower chest and to the right of the midline in the abdomen. There is stranding and small amount of fluid surrounding the shunt catheter in the subcutaneous tissues. Small amount of fluid about the pelvic portion of shunt catheter tubing best appreciated on coronal and sagittal reformats image 65 and 1 and 3 respectively. Tip of the shunt in the left pelvis without focal fluid collection at the tip. Trace free fluid in the left pericolic gutter and scattered throughout the mesentery. Musculoskeletal: There are no acute or suspicious osseous abnormalities. IMPRESSION: 1. Stranding and fluid about the subcutaneous portion ventriculoperitoneal shunt in the right  lower chest and abdomen. Fluid about the intrapelvic portion of the shunt. Infection is considered. 2. Possible bowel wall thickening at the descending sigmoid colonic junction in the region of peritoneal catheter shunt fluid, may be reactive or infectious/inflammatory. 3. Suprapubic tube decompressing the urinary bladder. Electronically Signed   By: Rubye OaksMelanie  Ehinger M.D.   On: 12/30/2017 22:59    Old record reviewed Case discussed with edp   Assessment/Plan 27 yo male with cellulitis associated with vp shunt along chest and abdominal wall  Principal Problem:   Cellulitis- vanc and rocephin.  bc ordered.  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

## 2017-12-31 NOTE — Consult Note (Signed)
[facility]    Date of Admission:  12/30/2017           Day 2 vancomycin        Day 2 ceftriaxone       Reason for Consult: Fever and headache    Referring Provider: Dr. Hartley BarefootBelkys Regalado  Assessment: I am concerned that he may have infection shunt and is upper abdomen and chest wall.  He was also noted to have some pus draining from around his suprapubic catheter but I doubt that that is the primary cause of his fever, headache and superficial pain.  I will treat him with vancomycin and ceftazidime pending further observation, blood culture results and neurosurgical evaluation.  Plan: 1. Continue vancomycin 2. Change ceftriaxone to ceftazidime 3. 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 dolutegravir  50 mg Oral Daily  . emtricitabine-rilpivir-tenofovir AF  1 tablet Oral Q breakfast  . fluconazole  400 mg Oral Daily  . levETIRAcetam  1,000 mg Oral BID  . potassium chloride  40 mEq Oral BID  . [START ON 01/02/2018] sulfamethoxazole-trimethoprim  1 tablet Oral Once per day on Mon Wed Fri   Continuous Infusions: . sodium chloride 100 mL/hr at 12/31/17 0951  . cefTRIAXone (ROCEPHIN)  IV    . potassium chloride 10 mEq (12/31/17 1109)  . vancomycin 1,000 mg (12/31/17 1009)   PRN Meds:.acetaminophen, loperamide, ondansetron **OR** ondansetron (ZOFRAN) IV, QUEtiapine, traMADol  HPI: Justin Ball is a 27 y.o. male with HIV infection that was complicated by severe cryptococcal meningitis last summer.  He required VP shunting.  He was left with permanent neurologic impairment and a seizure disorder.  He is cared for by his grandmother who oversees a occasions  for him.  He has not missed any doses.  His last HIV lab work in November showed an undetectable viral load of less than 20 and a CD4 count of 100.  He recently developed some swelling and tenderness on his right anterior chest over his VP shunt.  He was seen at urgent care on New Year's Day and was started on full dose trimethoprim sulfamethoxazole.  He continued to have tenderness and then began to develop high fever and headache leading to admission yesterday.  His temperature was 103.3 degrees.   Review of Systems: Review of Systems  Unable to perform ROS: Mental acuity    Past Medical History:  Diagnosis Date  . 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(HCC) infectious disease--- dr Zenaida Niecevan dam   CD4=50 on 08-02-2017  . Anxiety   . 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 Bipolar 1 disorder (HCC)   . 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 Cognitive communication deficit    working w/ therapy  . 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 Depression   . 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  Fecal incontinence 11/19/2017  . Foley catheter in place   . 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 History of scabies 2013  . History of syphilis   . 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 Neurogenic bowel    wears depends  . Neuromuscular disorder (HCC)   . 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 Seizure disorder [facility])    neurologist-  dr Marjory Lies ([facility])--caused by  cryptococcal meningitis/ multiple infarctions--  no seizure since hospitalization May 2018  . Status epilepticus (HCC)   . Visuospatial deficit     Social History   Tobacco Use  . Smoking status: Current Every Day Smoker    Packs/day: 0.50    Years: 5.00    Pack years: 2.50    Types: Cigarettes  . Smokeless tobacco: Never Used  . Tobacco comment: cutting back  Substance Use Topics  . Alcohol use: No    Alcohol/week: 0.0 oz  . Drug use: No    Family History  Problem Relation Age of Onset  . Hypertension Other    Allergies  Allergen Reactions  . Acyclovir And Related     OBJECTIVE: Blood pressure 113/74, pulse (!) 122, temperature (!) 103.3 F (39.6 C), temperature source Oral, resp. rate (!) 27, height 6\' 1"  (1.854 m), weight 173 lb 11.6 oz (78.8 kg), SpO2 97 %.  Physical Exam  Constitutional:  He is resting quietly in bed.  His mother and grandmother are present.  HENT:  Mouth/Throat: No oropharyngeal exudate.  Eyes: Conjunctivae are normal.  Neck: Neck supple.  Cardiovascular: Regular rhythm.  No murmur heard. He is tachycardic.  Pulmonary/Chest: Effort normal. He has no wheezes. He has no rales.  His VP shunt can be easily seen and palpated on the right side of his neck coursing over the clavicle and down his anterior chest.  He is tender along the course of the catheter from his lower neck to his upper abdomen.  His grandmother states that the area that was noted to be indurated and erythematous in the upper abdomen is looking a little bit better.  She says it is not as red as it was last week.  Abdominal: Soft. He exhibits no distension. There is tenderness.  Neurological: He is alert.  He provides simple, 1 word answers to most questions.  Skin: No rash noted.    Lab Results Lab Results  Component Value Date   WBC 29.5 (H) 12/31/2017   HGB 12.3 (L) 12/31/2017   HCT 35.7 (L) 12/31/2017   MCV 94.7 12/31/2017   PLT 360 12/31/2017    Lab Results    Component Value Date   CREATININE 1.21 12/31/2017   BUN 7 12/31/2017   NA 135 12/31/2017   K 2.8 (L) 12/31/2017   CL 105 12/31/2017   CO2 20 (L) 12/31/2017    Lab Results  Component Value Date   ALT 35 12/30/2017   AST 44 (H) 12/30/2017   ALKPHOS 96 12/30/2017   BILITOT 1.8 (H) 12/30/2017     Microbiology: No results found for this or any previous visit (from the past 240 hour(s)).  Cliffton Asters, MD [facility] [phone] pager   [phone] cell 12/31/2017, 11:10 AM

## 2017-12-31 NOTE — Progress Notes (Signed)
PHARMACY - PHYSICIAN COMMUNICATION CRITICAL VALUE ALERT - BLOOD CULTURE IDENTIFICATION (BCID)  Justin Ball is an 27 y.o. male who [facility] with a chief complaint of fever and headache.   Assessment: abdominal wall infection and also with possible infected shunt  Name of physician (or Provider) Contacted: Dr. Orvan Falconerampbell  Current antibiotics: vancomycin and fortaz  Changes to prescribed antibiotics recommended:  Patient is on recommended antibiotics - No changes needed  -Change vancomycin to 1000mg  IV q8h to cover for possible VP shunt infection  Results for orders placed or performed during the hospital encounter of 12/30/17  Blood Culture ID Panel (Reflexed) (Collected: 12/30/2017  6:40 PM)  Result Value Ref Range   Enterococcus species NOT DETECTED NOT DETECTED   Listeria monocytogenes NOT DETECTED NOT DETECTED   Staphylococcus species DETECTED (A) NOT DETECTED   Staphylococcus aureus NOT DETECTED NOT DETECTED   Methicillin resistance DETECTED (A) NOT DETECTED   Streptococcus species NOT DETECTED NOT DETECTED   Streptococcus agalactiae NOT DETECTED NOT DETECTED   Streptococcus pneumoniae NOT DETECTED NOT DETECTED   Streptococcus pyogenes NOT DETECTED NOT DETECTED   Acinetobacter baumannii NOT DETECTED NOT DETECTED   Enterobacteriaceae species NOT DETECTED NOT DETECTED   Enterobacter cloacae complex NOT DETECTED NOT DETECTED   Escherichia coli NOT DETECTED NOT DETECTED   Klebsiella oxytoca NOT DETECTED NOT DETECTED   Klebsiella pneumoniae NOT DETECTED NOT DETECTED   Proteus species NOT DETECTED NOT DETECTED   Serratia marcescens NOT DETECTED NOT DETECTED   Haemophilus influenzae NOT DETECTED NOT DETECTED   Neisseria meningitidis NOT DETECTED NOT DETECTED   Pseudomonas aeruginosa NOT DETECTED NOT DETECTED   Candida albicans NOT DETECTED NOT DETECTED   Candida glabrata NOT DETECTED NOT DETECTED   Candida krusei NOT DETECTED NOT DETECTED   Candida  parapsilosis NOT DETECTED NOT DETECTED   Candida tropicalis NOT DETECTED NOT DETECTED    [name], Pharm D 12/31/2017 8:46 PM

## 2017-12-31 NOTE — Progress Notes (Signed)
PROGRESS NOTE    Justin Ball  ZOX:[id number] DOB: 06/02/1991 DOA: 12/30/2017 PCP: Randall Hiss, MD    Brief Narrative:  Justin Ball is a 27 y.o. 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  He was placed on bactrim which he has taken 3 doses.  The rash has gotten worse and is tracking up along where his shunt is under his skin along his chest wall.  Pt came in for fever and chills.  Denies any urinary symptoms.  No cough.  No sob.  Pt referred for admission for cellulitis associated with his vp shunt.  Both ID and neurosurg called and will both see in the am.     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  Presents with fever, leukocytosis, tachycardia, tachypnea.  Continue with vancomycin and zosyn.  Neurosurgery consulted.  Iv fluids, bolus.  Check urine culture, blood culture.   2-AIDS: Continue with Tivicay, Charlett Lango.  ID consulted.  Seizure: Continue with Keppra.   Hypokalemia; replete IV and orally.   S/P VP Shunt. Neurosurgery has been consulted due to infection.   Chronic viral hepatitis. Follow by ID.   Bipolar; on Seroquel.     DVT prophylaxis: SCD Code Status: Full code.  Family Communication: mother at bedside.  Disposition Plan: remain inpatient.     Consultants:   Neurosurgery   ID   Procedures:   none   Antimicrobials:   Vancomycin 1-6  Ceftriaxone 1-06  Bactrim 1-06.    Subjective: He is alert, report worsening redness chest wall and upper abdomen wall.   Objective: Vitals:   12/31/17 0611 12/31/17 0615 12/31/17 0645 12/31/17 0700  BP:    108/67  Pulse:  (!) 122    Resp:  (!) 36  (!) 27  Temp:      TempSrc:      SpO2: 98% 100% 96%   Weight:      Height:        Intake/Output Summary (Last 24 hours) at 12/31/2017 0728 Last data filed at 12/31/2017 0324 Gross per 24 hour  Intake 500 ml  Output 650 ml  Net -150 ml   Filed Weights   12/30/17 1808  Weight: 81.6 kg (180 lb)    Examination:  General exam: Appears calm and comfortable  Respiratory system: Clear to auscultation. Respiratory effort normal. Cardiovascular system: S1 & S2 heard, RRR. No JVD, murmurs, rubs, gallops or clicks. No pedal edema. Gastrointestinal system: Abdomen is nondistended, soft and nontender. No organomegaly or masses felt. Normal bowel sounds heard. Suprapubic catheter place.  Central nervous system: Alert and oriented. No focal neurological deficits. Extremities: Symmetric 5 x 5 power. Skin: redness chest wall.     Data Reviewed: I have personally reviewed following labs and imaging studies  CBC: Recent Labs  Lab 12/30/17 1830 12/31/17 0439  WBC 27.7* 29.5*  NEUTROABS 23.9*  --   HGB 12.4* 12.3*  HCT 37.1* 35.7*  MCV 95.1 94.7  PLT 403* 360  Basic Metabolic Panel: Recent Labs  Lab 12/30/17 1830 12/31/17 0439  NA 132* 135  K 3.1* 2.8*  CL 98* 105  CO2 23 20*  GLUCOSE 116* 98  BUN 6 7  CREATININE 1.33* 1.21  CALCIUM 8.9 8.1*   GFR: Estimated Creatinine Clearance: 104.6 mL/min (by C-G formula based on SCr of 1.21 mg/dL). Liver Function Tests: Recent Labs  Lab 12/30/17 1830  AST 44*  ALT 35  ALKPHOS 96  BILITOT 1.8*  PROT 9.0*  ALBUMIN 3.1*   No results for input(s): LIPASE, AMYLASE in the last 168 hours. No results for input(s): AMMONIA in the last 168 hours. Coagulation Profile: Recent Labs  Lab 12/30/17 1830  INR 1.14   Cardiac Enzymes: No results for input(s): CKTOTAL, CKMB, CKMBINDEX, TROPONINI in the last 168 hours. BNP (last 3  results) No results for input(s): PROBNP in the last 8760 hours. HbA1C: No results for input(s): HGBA1C in the last 72 hours. CBG: No results for input(s): GLUCAP in the last 168 hours. Lipid Profile: No results for input(s): CHOL, HDL, LDLCALC, TRIG, CHOLHDL, LDLDIRECT in the last 72 hours. Thyroid Function Tests: No results for input(s): TSH, T4TOTAL, FREET4, T3FREE, THYROIDAB in the last 72 hours. Anemia Panel: No results for input(s): VITAMINB12, FOLATE, FERRITIN, TIBC, IRON, RETICCTPCT in the last 72 hours. Sepsis Labs: Recent Labs  Lab 12/30/17 1848 12/30/17 2042  LATICACIDVEN 1.54 1.72    No results found for this or any previous visit (from the past 240 hour(s)).       Radiology Studies: Dg Chest 2 View  Result Date: 12/30/2017 CLINICAL DATA:  Pt here for chest pain. Had shunt placed in May that drains to stomach. Area to anterior chest and R lat neck is red and very tender to palpation. EXAM: CHEST  2 VIEW COMPARISON:  07/12/2017 FINDINGS: Patient has a right-sided ventriculoperitoneal shunt, its visualized course unremarkable. The heart size is normal. The lungs are free of focal consolidations and pleural effusions. IMPRESSION: No active cardiopulmonary disease. Electronically Signed   By: Norva Pavlov M.D.   On: 12/30/2017 19:36   Ct Head Wo Contrast  Result Date: 12/30/2017 CLINICAL DATA:  27 y/o M; history of cryptococcal meningitis post VP shunt. Febrile. Cellulitis along course of VP shunt catheter. Headache. EXAM: CT HEAD WITHOUT CONTRAST TECHNIQUE: Contiguous axial images were obtained from the base of the skull through the vertex without intravenous contrast. COMPARISON:  06/04/2017 CT head.  05/25/2017 MRI head. FINDINGS: Brain: Linear lucent track traversing right frontal lobe into right caudate nucleus head from prior catheter insertion. Right frontal approach ventriculostomy catheter tip is in frontal horn of right lateral ventricle. Small lucencies present  in right posterior limb of internal capsule, left caudate head, and left occipital lobe correspond to lesions on prior MRI of the brain. No evidence for new large acute infarct, hemorrhage, or focal mass effect. No hydrocephalus. No effacement of basilar cisterns. Vascular: No hyperdense vessel or unexpected calcification. Skull: Resolution of postsurgical changes related to right frontal burr hole. No acute osseous abnormality. Sinuses/Orbits: No acute finding. Other: None. IMPRESSION: 1. No acute intracranial abnormality identified. 2. Right frontal approach ventriculostomy catheter with tip in the frontal horn of right lateral ventricle. No hydrocephalus. 3. Small lucent foci are present within the right posterior limb of internal capsule, left caudate head, and left occipital cortex corresponding to lesions on prior MRI of the brain likely representing postinflammatory sequelae. Electronically Signed   By: Mitzi Hansen M.D.   On: 12/30/2017  23:08   Ct Soft Tissue Neck W Contrast  Result Date: 12/30/2017 CLINICAL DATA:  History of cryptococcal meningitis status post VP shunt. Cellulitis along the course of the shunt with soft tissue swelling in the lower neck. Febrile. EXAM: CT NECK WITH CONTRAST TECHNIQUE: Multidetector CT imaging of the neck was performed using the standard protocol following the bolus administration of intravenous contrast. CONTRAST:  ISOVUE-300 IOPAMIDOL (ISOVUE-300) INJECTION 61% COMPARISON:  Neck CT 09/15/2016 FINDINGS: Pharynx and larynx: Symmetric pharyngeal soft tissues without evidence of mass. Fluid collections involving the floor of mouth, oropharynx, narrowed nasopharynx, and soft palate on the prior neck CT have resolved. The larynx is unremarkable. Salivary glands: No inflammation, mass, or stone. Thyroid: Unremarkable. Lymph nodes: No enlarged or suspicious lymph nodes in the neck. Vascular: Major vascular structures of the neck appear patent. Limited  intracranial: Small amount of focal low-density partially visualized involving anterior frontal cortex bilaterally and possibly cerebellum which may reflect sequelae of prior infection or infarct. Visualized orbits: Unremarkable. Mastoids and visualized paranasal sinuses: Clear. Skeleton: No acute osseous abnormality or suspicious osseous lesion. Upper chest: Clear lung apices. Other: The patient's VP shunt catheter courses through the right lateral and anterior lower neck and into the upper chest wall. No fluid collection is identified along the catheter tract. IMPRESSION: No fluid collection identified along the VP shunt catheter. Electronically Signed   By: Sebastian Ache M.D.   On: 12/30/2017 22:06   Ct Abdomen Pelvis W Contrast  Result Date: 12/30/2017 CLINICAL DATA:  VP shunt infection from abdomen to chest. Rule out intra-abdominal pseudocyst. EXAM: CT ABDOMEN AND PELVIS WITH CONTRAST TECHNIQUE: Multidetector CT imaging of the abdomen and pelvis was performed using the standard protocol following bolus administration of intravenous contrast. CONTRAST:  100 cc Isovue-300 IV COMPARISON:  Abdominal CT 03/03/2017 FINDINGS: Lower chest: The lung bases are clear. Hepatobiliary: No focal liver abnormality is seen. No gallstones, gallbladder wall thickening, or biliary dilatation. Pancreas: No ductal dilatation or inflammation. Spleen: Normal in size without focal abnormality. Adrenals/Urinary Tract: Adrenal glands are unremarkable. Kidneys are normal, without renal calculi, focal lesion, or hydronephrosis. Bladder is decompressed by suprapubic tube. Stomach/Bowel: The stomach is nondistended. Lack of enteric contrast limits bowel evaluation. Sigmoid colon is tortuous. Possible colonic wall thickening the descending/sigmoid colonic junction for example image 84 series 8. No evidence of bowel obstruction. Liquid stool in the ascending colon without colonic inflammation or wall thickening. Vascular/Lymphatic: Shotty  retroperitoneal nodes left periaortic station. Small mesenteric nodes. No acute vascular abnormality. Reproductive: Prostate is unremarkable. Other: Ventriculoperitoneal shunt catheter coursing in the right lower chest and to the right of the midline in the abdomen. There is stranding and small amount of fluid surrounding the shunt catheter in the subcutaneous tissues. Small amount of fluid about the pelvic portion of shunt catheter tubing best appreciated on coronal and sagittal reformats image 65 and 1 and 3 respectively. Tip of the shunt in the left pelvis without focal fluid collection at the tip. Trace free fluid in the left pericolic gutter and scattered throughout the mesentery. Musculoskeletal: There are no acute or suspicious osseous abnormalities. IMPRESSION: 1. Stranding and fluid about the subcutaneous portion ventriculoperitoneal shunt in the right lower chest and abdomen. Fluid about the intrapelvic portion of the shunt. Infection is considered. 2. Possible bowel wall thickening at the descending sigmoid colonic junction in the region of peritoneal catheter shunt fluid, may be reactive or infectious/inflammatory. 3. Suprapubic tube decompressing the urinary bladder. Electronically Signed   By: Shawna Orleans  Ehinger M.D.   On: 12/30/2017 22:59        Scheduled Meds: . dolutegravir  50 mg Oral Daily  . emtricitabine-rilpivir-tenofovir AF  1 tablet Oral Q breakfast  . levETIRAcetam  1,000 mg Oral BID  . potassium chloride  40 mEq Oral BID  . sulfamethoxazole-trimethoprim  1 tablet Oral BID   Continuous Infusions: . 0.9 % NaCl with KCl 20 mEq / L 100 mL/hr at 12/31/17 0423  . cefTRIAXone (ROCEPHIN)  IV    . sodium chloride    . vancomycin       LOS: 0 days    Time spent: 35 minutes.     Alba CoryBelkys A Dalisa Forrer, MD [facility] Pager [phone]  If 7PM-7AM, please contact night-coverage [email or web address] Password [email or web address] 12/31/2017, 7:28 AM

## 2017-12-31 NOTE — Consult Note (Addendum)
Chief Complaint   Chief Complaint  Patient presents with  . Chest Pain  . Fever    History of Present Illness  Justin Ball is a 27 y.o. 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 He presented to the ED after a few days of superficial chest and abdominal pain along the shunt tract. He also presented with fever and had a HA yesterday although denies HA today.  Past Medical History   Past Medical History:  Diagnosis Date  . 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 Anxiety   . 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 Bipolar 1 disorder (HCC)   . 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 Cognitive communication deficit    working w/ therapy  . 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 Depression   . 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 Fecal incontinence 11/19/2017  . Foley catheter in place   . 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 History of scabies 2013  . History of syphilis   . 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 Neurogenic bowel    wears depends  . Neuromuscular disorder (HCC)   . 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 Seizure disorder [facility])    neurologist-  dr Marjory Lies ([facility])--caused by cryptococcal meningitis/ multiple infarctions--  no seizure since hospitalization May 2018  . Status  epilepticus (HCC)   . Visuospatial deficit     Past Surgical History   Past Surgical History:  Procedure Laterality Date  . CYSTOSCOPY N/A 10/10/2017   Procedure: CYSTOSCOPY;  Surgeon: Sebastian Ache, MD;  Location: [facility];  Service: Urology;  Laterality: N/A;  . EXAMINATION UNDER ANESTHESIA N/A 12/28/2014   Procedure: EXAM UNDER ANESTHESIA;  Surgeon: Karie Soda, MD;  Location: [facility];  Service: General;  Laterality: N/A;  . FLOOR OF MOUTH BIOPSY N/A 09/16/2016   Procedure: BIOPSY OF ORAL ABCESS;  Surgeon: Newman Pies, MD;  Location: [facility];  Service: ENT;  Laterality: N/A;  . INCISION AND DRAINAGE ABSCESS N/A 09/16/2016   Procedure: INCISION AND DRAINAGE ABSCESS;  Surgeon: Newman Pies, MD;  Location: [facility];  Service: ENT;  Laterality: N/A;  . INCISION AND DRAINAGE PERIRECTAL ABSCESS  09/ 03/ 2011   dr toth  . INCISION AND DRAINAGE PERIRECTAL ABSCESS N/A 12/28/2014   Procedure: IRRIGATION AND DEBRIDEMENT PERIRECTAL ABSCESS;  Surgeon: Karie Soda, MD;  Location: [facility];  Service: General;  Laterality: N/A;  Fistula repair and ablation  . INSERTION OF SUPRAPUBIC CATHETER N/A 10/10/2017   Procedure: INSERTION OF SUPRAPUBIC CATHETER;  Surgeon: Sebastian Ache, MD;  Location: [facility];  Service: Urology;  Laterality: N/A;  . LAPAROSCOPIC REVISION VENTRICULAR-PERITONEAL (V-P) SHUNT N/A 06/12/2017   Procedure: LAPAROSCOPIC REVISION VENTRICULAR-PERITONEAL (V-P) SHUNT;  Surgeon: Kinsinger, De Blanch, MD;  Location: [facility];  Service: General;  Laterality: N/A;  .  TRANSTHORACIC ECHOCARDIOGRAM  06/02/2017   EF 65-70%/  trivial PR  . VENTRICULOPERITONEAL SHUNT N/A 06/12/2017   Procedure: SHUNT INSERTION VENTRICULAR-PERITONEAL;  Surgeon: Lisbeth RenshawNundkumar, Ricardo Schubach, MD;  Location: [facility];  Service: Neurosurgery;  Laterality: N/A;    Social History   Social History   Tobacco Use  . Smoking status: Current Every Day Smoker    Packs/day: 0.50    Years: 5.00    Pack years: 2.50     Types: Cigarettes  . Smokeless tobacco: Never Used  . Tobacco comment: cutting back  Substance Use Topics  . Alcohol use: No    Alcohol/week: 0.0 oz  . Drug use: No    Medications   Prior to Admission medications   Medication Sig Start Date End Date Taking? Authorizing Provider  acetaminophen (TYLENOL) 325 MG tablet Take 2 tablets (650 mg total) by mouth every 4 (four) hours as needed for mild pain (temp > 101.5). 06/28/17  Yes Cliffton Astersampbell, John, MD  dolutegravir (TIVICAY) 50 MG tablet Take 1 tablet (50 mg total) by mouth daily. Patient taking differently: Take 50 mg by mouth every morning.  07/20/17  Yes Daiva EvesVan Dam, Lisette Grinderornelius N, MD  emtricitabine-rilpivir-tenofovir AF (ODEFSEY) 200-25-25 MG TABS tablet Take 1 tablet by mouth daily with breakfast. Patient taking differently: Take 1 tablet by mouth daily with breakfast.  07/20/17  Yes Daiva EvesVan Dam, Lisette Grinderornelius N, MD  levETIRAcetam (KEPPRA) 1000 MG tablet Take 1 tablet (1,000 mg total) by mouth 2 (two) times daily. Patient taking differently: Take 1,000 mg by mouth 2 (two) times daily.  07/20/17  Yes Daiva EvesVan Dam, Lisette Grinderornelius N, MD  QUEtiapine (SEROQUEL) 25 MG tablet Take 1 tablet (25 mg total) by mouth 2 (two) times daily as needed (agitation). Patient taking differently: Take 25 mg by mouth 2 (two) times daily.  07/20/17  Yes Daiva EvesVan Dam, Lisette Grinderornelius N, MD  sulfamethoxazole-trimethoprim (BACTRIM DS,SEPTRA DS) 800-160 MG tablet Take 1 tablet by mouth 2 (two) times daily. 12/25/17  Yes Mardella LaymanHagler, Brian, MD  traMADol (ULTRAM) 50 MG tablet Take 1-2 tablets (50-100 mg total) by mouth every 6 (six) hours as needed for moderate pain or severe pain. Post-operatively. 10/10/17 10/10/18 Yes Sebastian AcheManny, Theodore, MD    Allergies   Allergies  Allergen Reactions  . Acyclovir And Related     Review of Systems  ROS  Neurologic Exam  Awake, alert, oriented [facility] Speech fluent, appropriate CN grossly intact Motor exam: Upper Extremities Deltoid Bicep Tricep  Grip  Right 5/5 5/5 5/5 5/5  Left 5/5 5/5 5/5 5/5   Lower Extremities IP Quad PF DF EHL  Right 5/5 5/5 5/5 5/5 5/5  Left 5/5 5/5 5/5 5/5 5/5   Sensation grossly intact to LT TTP with some erythema long the shunt tract from the clavicle to the abdomen. Valve pumps and refills briskly.  Imaging  CTH shows no ventriculomegaly, good position of right frontal shunt catheter. No pseudocyst on abd CT.   Labs  WBC elevated at 29.5 Peripheral blood culture (+) 1/2 bottles for GPC in clusters  Impression  - 27 y.o. male with HIV and right frontal VPS placed June 2018 with likely shunt infection given clinical presentation including shunt tract erythema, tenderness, leukocytosis, and (+) blood culture. Will need removal of shunt with EVD placement. Will probably attempt to see if he is truly still shunt dependent with the EVD. If he is, will likely replace on the contralateral side timing to be determined in conjunction with ID service once his  blood cultures clear and his WBC is trending down.  Plan  - Will remove VPS and place EVD likely tomorrow - Will check CRP to trend

## 2017-12-31 NOTE — ED Notes (Signed)
Spoke with floor coverage about patients condition and vitals. Acknowledged and orders will be placed

## 2017-12-31 NOTE — ED Notes (Signed)
Fannie KneeSue, pt Grandmother/caretaker, requesting someone call her when pt gets a bed assigned. Phone number: [phone]

## 2017-12-31 NOTE — ED Notes (Signed)
Sent page to floor coverage for meds per patient request

## 2017-12-31 NOTE — Progress Notes (Signed)
Pharmacy Antibiotic Note  Justin Ball is a 27 y.o. male admitted on 12/30/2017 with abdominal wall cellulitis.  Pharmacy has been consulted for Vancomycin  Dosing.  Vancomycin 1.5 g IV given in ED at 0130  Plan: Vancomycin 1000 mg IV q12h F/U renal fxn  Height: 6\' 1"  (185.4 cm) Weight: 180 lb (81.6 kg) IBW/kg (Calculated) : 79.9  Temp (24hrs), Avg:101.6 F (38.7 C), Min:99.9 F (37.7 C), Max:103.2 F (39.6 C)  Recent Labs  Lab 12/30/17 1830 12/30/17 1848 12/30/17 2042  WBC 27.7*  --   --   CREATININE 1.33*  --   --   LATICACIDVEN  --  1.54 1.72    Estimated Creatinine Clearance: 95.1 mL/min (A) (by C-G formula based on SCr of 1.33 mg/dL (H)).    No Known Allergies   Eddie Candlebbott, Chidinma Clites [place] 12/31/2017 4:41 AM

## 2017-12-31 NOTE — Telephone Encounter (Signed)
PATIENT ON HOLD/Currently hospitalized  Plan of Care orders have expired effective 12/31/17 but GOALS have not been completely meet at this time. I would like to connect with the patient and received further orders from MD. If I am unable to get in contact with him within the next 30 days I will have to discharge at that time. RN WILL NOT RESUME CARE UNTIL NEW MD ORDERS OBTAINED AND PATIENT ABLE/WILLING TO RE-ENGAGE IN CARE

## 2017-12-31 NOTE — ED Notes (Signed)
Paged Hospitalist about patients condition

## 2018-01-01 ENCOUNTER — Inpatient Hospital Stay (HOSPITAL_COMMUNITY): Payer: Medicaid Other

## 2018-01-01 ENCOUNTER — Inpatient Hospital Stay (HOSPITAL_COMMUNITY): Payer: Medicaid Other | Admitting: Certified Registered"

## 2018-01-01 ENCOUNTER — Inpatient Hospital Stay (HOSPITAL_COMMUNITY)
Admission: EM | Disposition: A | Discharge status: Home Health | Payer: Self-pay | Source: Home / Self Care | Attending: Internal Medicine

## 2018-01-01 ENCOUNTER — Ambulatory Visit: Payer: Medicaid Other | Admitting: Infectious Disease

## 2018-01-01 ENCOUNTER — Encounter (HOSPITAL_COMMUNITY): Payer: Self-pay | Admitting: General Practice

## 2018-01-01 ENCOUNTER — Other Ambulatory Visit: Payer: Self-pay

## 2018-01-01 DIAGNOSIS — T85730A Infection and inflammatory reaction due to ventricular intracranial (communicating) shunt, initial encounter: Principal | ICD-10-CM

## 2018-01-01 DIAGNOSIS — Y838 Other surgical procedures as the cause of abnormal reaction of the patient, or of later complication, without mention of misadventure at the time of the procedure: Secondary | ICD-10-CM

## 2018-01-01 DIAGNOSIS — F319 Bipolar disorder, unspecified: Secondary | ICD-10-CM

## 2018-01-01 DIAGNOSIS — B1910 Unspecified viral hepatitis B without hepatic coma: Secondary | ICD-10-CM

## 2018-01-01 DIAGNOSIS — G40909 Epilepsy, unspecified, not intractable, without status epilepticus: Secondary | ICD-10-CM

## 2018-01-01 HISTORY — PX: SHUNT REMOVAL: SHX342

## 2018-01-01 LAB — BASIC METABOLIC PANEL
ANION GAP: 10 (ref 5–15)
BUN: 5 mg/dL — ABNORMAL LOW (ref 6–20)
CHLORIDE: 104 mmol/L (ref 101–111)
CO2: 22 mmol/L (ref 22–32)
Calcium: 8.5 mg/dL — ABNORMAL LOW (ref 8.9–10.3)
Creatinine, Ser: 1.02 mg/dL (ref 0.61–1.24)
GFR calc non Af Amer: >60 mL/min (ref >60)
Glucose, Bld: 117 mg/dL — ABNORMAL HIGH (ref 65–99)
POTASSIUM: 3.2 mmol/L — AB (ref 3.5–5.1)
SODIUM: 136 mmol/L (ref 135–145)

## 2018-01-01 LAB — CBC
HCT: 32.9 % — ABNORMAL LOW (ref 39.0–52.0)
HEMOGLOBIN: 11.2 g/dL — AB (ref 13.0–17.0)
MCH: 32.3 pg (ref 26.0–34.0)
MCHC: 34 g/dL (ref 30.0–36.0)
MCV: 94.8 fL (ref 78.0–100.0)
Platelets: 375 10*3/uL (ref 150–400)
RBC: 3.47 MIL/uL — AB (ref 4.22–5.81)
RDW: 14.4 % (ref 11.5–15.5)
WBC: 42.7 10*3/uL — AB (ref 4.0–10.5)

## 2018-01-01 LAB — SURGICAL PCR SCREEN
MRSA, PCR: NEGATIVE
Staphylococcus aureus: NEGATIVE

## 2018-01-01 SURGERY — SHUNT REMOVAL
Anesthesia: General | Site: Head

## 2018-01-01 MED ORDER — FENTANYL CITRATE (PF) 100 MCG/2ML IJ SOLN
INTRAMUSCULAR | Status: AC
  Filled 2018-01-01: qty 2

## 2018-01-01 MED ORDER — LIDOCAINE-EPINEPHRINE 1 %-1:100000 IJ SOLN
INTRAMUSCULAR | Status: AC
  Filled 2018-01-01: qty 1

## 2018-01-01 MED ORDER — THROMBIN (RECOMBINANT) 5000 UNITS EX SOLR
CUTANEOUS | Status: AC
  Filled 2018-01-01: qty 10000

## 2018-01-01 MED ORDER — PROMETHAZINE HCL 25 MG/ML IJ SOLN: 6.2500 mg | INTRAMUSCULAR | Status: DC | PRN

## 2018-01-01 MED ORDER — DEXAMETHASONE SODIUM PHOSPHATE 10 MG/ML IJ SOLN
INTRAMUSCULAR | Status: DC | PRN
  Administered 2018-01-01: 10 mg via INTRAVENOUS

## 2018-01-01 MED ORDER — SUCCINYLCHOLINE CHLORIDE 200 MG/10ML IV SOSY
PREFILLED_SYRINGE | INTRAVENOUS | Status: AC
  Filled 2018-01-01: qty 10

## 2018-01-01 MED ORDER — MEPERIDINE HCL 25 MG/ML IJ SOLN: 6.2500 mg | INTRAMUSCULAR | Status: DC | PRN

## 2018-01-01 MED ORDER — BUPIVACAINE HCL (PF) 0.5 % IJ SOLN
INTRAMUSCULAR | Status: AC
  Filled 2018-01-01: qty 30

## 2018-01-01 MED ORDER — SUGAMMADEX SODIUM 200 MG/2ML IV SOLN
INTRAVENOUS | Status: DC | PRN
  Administered 2018-01-01: 200 mg via INTRAVENOUS

## 2018-01-01 MED ORDER — FENTANYL CITRATE (PF) 100 MCG/2ML IJ SOLN
25.0000 ug | INTRAMUSCULAR | Status: DC | PRN
  Administered 2018-01-01 (×2): 50 ug via INTRAVENOUS

## 2018-01-01 MED ORDER — ACETAMINOPHEN 650 MG RE SUPP
650.0000 mg | Freq: Four times a day (QID) | RECTAL | Status: DC | PRN
  Administered 2018-01-01 (×2): 650 mg via RECTAL
  Filled 2018-01-01 (×2): qty 1

## 2018-01-01 MED ORDER — POTASSIUM CHLORIDE 10 MEQ/100ML IV SOLN
10.0000 meq | INTRAVENOUS | Status: AC
  Administered 2018-01-01 (×6): 10 meq via INTRAVENOUS
  Filled 2018-01-01 (×6): qty 100

## 2018-01-01 MED ORDER — ONDANSETRON HCL 4 MG/2ML IJ SOLN
INTRAMUSCULAR | Status: DC | PRN
  Administered 2018-01-01: 4 mg via INTRAVENOUS

## 2018-01-01 MED ORDER — LIDOCAINE 2% (20 MG/ML) 5 ML SYRINGE
INTRAMUSCULAR | Status: AC
  Filled 2018-01-01: qty 5

## 2018-01-01 MED ORDER — FLUCONAZOLE IN SODIUM CHLORIDE 400-0.9 MG/200ML-% IV SOLN
400.0000 mg | INTRAVENOUS | Status: DC
  Administered 2018-01-01 – 2018-01-02 (×2): 400 mg via INTRAVENOUS
  Filled 2018-01-01 (×2): qty 200

## 2018-01-01 MED ORDER — MIDAZOLAM HCL 2 MG/2ML IJ SOLN
INTRAMUSCULAR | Status: DC | PRN
  Administered 2018-01-01: 2 mg via INTRAVENOUS

## 2018-01-01 MED ORDER — BACITRACIN ZINC 500 UNIT/GM EX OINT
TOPICAL_OINTMENT | CUTANEOUS | Status: DC | PRN
  Administered 2018-01-01: 1 via TOPICAL

## 2018-01-01 MED ORDER — THROMBIN 5000 UNITS EX SOLR
CUTANEOUS | Status: DC | PRN
  Administered 2018-01-01: 5000 [IU] via TOPICAL

## 2018-01-01 MED ORDER — PROPOFOL 10 MG/ML IV BOLUS
INTRAVENOUS | Status: DC | PRN
  Administered 2018-01-01: 110 mg via INTRAVENOUS

## 2018-01-01 MED ORDER — MORPHINE SULFATE (PF) 4 MG/ML IV SOLN
2.0000 mg | INTRAVENOUS | Status: DC | PRN
  Administered 2018-01-01 – 2018-01-17 (×45): 2 mg via INTRAVENOUS
  Filled 2018-01-01 (×46): qty 1

## 2018-01-01 MED ORDER — SODIUM CHLORIDE 0.9 % IV BOLUS (SEPSIS)
500.0000 mL | Freq: Once | INTRAVENOUS | Status: AC
  Administered 2018-01-01: 500 mL via INTRAVENOUS

## 2018-01-01 MED ORDER — ROCURONIUM BROMIDE 10 MG/ML (PF) SYRINGE
PREFILLED_SYRINGE | INTRAVENOUS | Status: DC | PRN
  Administered 2018-01-01: 20 mg via INTRAVENOUS
  Administered 2018-01-01: 50 mg via INTRAVENOUS

## 2018-01-01 MED ORDER — POTASSIUM CHLORIDE 10 MEQ/100ML IV SOLN: 10.0000 meq | INTRAVENOUS | Status: DC

## 2018-01-01 MED ORDER — POTASSIUM CHLORIDE 10 MEQ/50ML IV SOLN: 10.0000 meq | INTRAVENOUS | Status: DC

## 2018-01-01 MED ORDER — MIDAZOLAM HCL 2 MG/2ML IJ SOLN: 0.5000 mg | Freq: Once | INTRAMUSCULAR | Status: DC | PRN

## 2018-01-01 MED ORDER — HEMOSTATIC AGENTS (NO CHARGE) OPTIME
TOPICAL | Status: DC | PRN
Start: 2018-01-01 — End: 2018-01-01
  Administered 2018-01-01 (×2): 1 via TOPICAL

## 2018-01-01 MED ORDER — MIDAZOLAM HCL 2 MG/2ML IJ SOLN
INTRAMUSCULAR | Status: AC
  Filled 2018-01-01: qty 2

## 2018-01-01 MED ORDER — PROCHLORPERAZINE EDISYLATE 5 MG/ML IJ SOLN: 10.0000 mg | Freq: Four times a day (QID) | INTRAMUSCULAR | Status: DC | PRN

## 2018-01-01 MED ORDER — FENTANYL CITRATE (PF) 100 MCG/2ML IJ SOLN: 25.0000 ug | INTRAMUSCULAR | Status: DC | PRN

## 2018-01-01 MED ORDER — FENTANYL CITRATE (PF) 250 MCG/5ML IJ SOLN
INTRAMUSCULAR | Status: AC
  Filled 2018-01-01: qty 5

## 2018-01-01 MED ORDER — ROCURONIUM BROMIDE 10 MG/ML (PF) SYRINGE
PREFILLED_SYRINGE | INTRAVENOUS | Status: AC
  Filled 2018-01-01: qty 5

## 2018-01-01 MED ORDER — LIDOCAINE 2% (20 MG/ML) 5 ML SYRINGE
INTRAMUSCULAR | Status: DC | PRN
  Administered 2018-01-01: 20 mg via INTRAVENOUS

## 2018-01-01 MED ORDER — SUCCINYLCHOLINE CHLORIDE 200 MG/10ML IV SOSY
PREFILLED_SYRINGE | INTRAVENOUS | Status: DC | PRN
  Administered 2018-01-01: 120 mg via INTRAVENOUS

## 2018-01-01 MED ORDER — DEXAMETHASONE SODIUM PHOSPHATE 10 MG/ML IJ SOLN
INTRAMUSCULAR | Status: AC
  Filled 2018-01-01: qty 1

## 2018-01-01 MED ORDER — SUGAMMADEX SODIUM 200 MG/2ML IV SOLN
INTRAVENOUS | Status: AC
  Filled 2018-01-01: qty 2

## 2018-01-01 MED ORDER — FENTANYL CITRATE (PF) 250 MCG/5ML IJ SOLN
INTRAMUSCULAR | Status: DC | PRN
  Administered 2018-01-01: 100 ug via INTRAVENOUS
  Administered 2018-01-01: 150 ug via INTRAVENOUS

## 2018-01-01 MED ORDER — SODIUM CHLORIDE 0.9 % IV SOLN
1000.0000 mg | Freq: Two times a day (BID) | INTRAVENOUS | Status: DC
  Administered 2018-01-01 – 2018-01-17 (×32): 1000 mg via INTRAVENOUS
  Filled 2018-01-01 (×33): qty 10

## 2018-01-01 MED ORDER — PROPOFOL 10 MG/ML IV BOLUS
INTRAVENOUS | Status: AC
  Filled 2018-01-01: qty 20

## 2018-01-01 MED ORDER — BACITRACIN ZINC 500 UNIT/GM EX OINT
TOPICAL_OINTMENT | CUTANEOUS | Status: AC
  Filled 2018-01-01: qty 28.35

## 2018-01-01 MED ORDER — MORPHINE SULFATE (PF) 4 MG/ML IV SOLN
2.0000 mg | INTRAVENOUS | Status: DC | PRN
  Administered 2018-01-01 (×2): 2 mg via INTRAVENOUS
  Filled 2018-01-01 (×2): qty 1

## 2018-01-01 MED ORDER — LACTATED RINGERS IV SOLN
INTRAVENOUS | Status: DC
  Administered 2018-01-01: 18:00:00 via INTRAVENOUS

## 2018-01-01 SURGICAL SUPPLY — 58 items
BLADE CLIPPER SURG (BLADE) ×3 IMPLANT
BLADE SURG 11 STRL SS (BLADE) IMPLANT
BOOT SUTURE AID YELLOW STND (SUTURE) IMPLANT
BUR PRECISION FLUTE 5.0 (BURR) IMPLANT
CANISTER SUCT 3000ML PPV (MISCELLANEOUS) ×3 IMPLANT
CARTRIDGE OIL MAESTRO DRILL (MISCELLANEOUS) ×1 IMPLANT
CATH VENTRIC 35X38 W/TROCAR LG (CATHETERS) ×3 IMPLANT
CLIP RANEY DISP (INSTRUMENTS) IMPLANT
CLOSURE WOUND 1/2 X4 (GAUZE/BANDAGES/DRESSINGS)
DIFFUSER DRILL AIR PNEUMATIC (MISCELLANEOUS) ×3 IMPLANT
DRAPE HALF SHEET 40X57 (DRAPES) ×3 IMPLANT
DRAPE INCISE IOBAN 66X45 STRL (DRAPES) IMPLANT
DRAPE ORTHO SPLIT 77X108 STRL (DRAPES) ×4
DRAPE POUCH INSTRU U-SHP 10X18 (DRAPES) ×3 IMPLANT
DRAPE SURG ORHT 6 SPLT 77X108 (DRAPES) ×2 IMPLANT
DRSG OPSITE 4X5.5 SM (GAUZE/BANDAGES/DRESSINGS) ×3 IMPLANT
DRSG OPSITE POSTOP 3X4 (GAUZE/BANDAGES/DRESSINGS) ×6 IMPLANT
DURAPREP 26ML APPLICATOR (WOUND CARE) ×6 IMPLANT
ELECT REM PT RETURN 9FT ADLT (ELECTROSURGICAL) ×3
ELECTRODE REM PT RTRN 9FT ADLT (ELECTROSURGICAL) ×1 IMPLANT
GAUZE SPONGE 4X4 12PLY STRL (GAUZE/BANDAGES/DRESSINGS) ×3 IMPLANT
GAUZE SPONGE 4X4 16PLY XRAY LF (GAUZE/BANDAGES/DRESSINGS) IMPLANT
GLOVE BIO SURGEON STRL SZ7 (GLOVE) IMPLANT
GLOVE BIOGEL PI IND STRL 7.0 (GLOVE) IMPLANT
GLOVE BIOGEL PI INDICATOR 7.0 (GLOVE)
GLOVE ECLIPSE 7.0 STRL STRAW (GLOVE) ×6 IMPLANT
GLOVE INDICATOR 7.5 STRL GRN (GLOVE) ×9 IMPLANT
GOWN STRL REUS W/ TWL LRG LVL3 (GOWN DISPOSABLE) ×3 IMPLANT
GOWN STRL REUS W/ TWL XL LVL3 (GOWN DISPOSABLE) IMPLANT
GOWN STRL REUS W/TWL 2XL LVL3 (GOWN DISPOSABLE) IMPLANT
GOWN STRL REUS W/TWL LRG LVL3 (GOWN DISPOSABLE) ×6
GOWN STRL REUS W/TWL XL LVL3 (GOWN DISPOSABLE)
HEMOSTAT SURGICEL 2X14 (HEMOSTASIS) IMPLANT
KIT BASIN OR (CUSTOM PROCEDURE TRAY) ×3 IMPLANT
KIT DRAIN CSF ACCUDRAIN (MISCELLANEOUS) ×3 IMPLANT
KIT ROOM TURNOVER OR (KITS) ×3 IMPLANT
MARKER SKIN DUAL TIP RULER LAB (MISCELLANEOUS) ×3 IMPLANT
NEEDLE HYPO 25X1 1.5 SAFETY (NEEDLE) ×3 IMPLANT
NS IRRIG 1000ML POUR BTL (IV SOLUTION) ×3 IMPLANT
OIL CARTRIDGE MAESTRO DRILL (MISCELLANEOUS) ×3
PACK LAMINECTOMY NEURO (CUSTOM PROCEDURE TRAY) ×3 IMPLANT
PAD ARMBOARD 7.5X6 YLW CONV (MISCELLANEOUS) ×9 IMPLANT
PASSER CATH 65CM DISP (NEUROSURGERY SUPPLIES) IMPLANT
SHEATH PERITONEAL INTRO 61 (MISCELLANEOUS) IMPLANT
SPONGE LAP 4X18 X RAY DECT (DISPOSABLE) IMPLANT
SPONGE SURGIFOAM ABS GEL SZ50 (HEMOSTASIS) ×3 IMPLANT
STAPLER SKIN PROX WIDE 3.9 (STAPLE) ×3 IMPLANT
STRIP CLOSURE SKIN 1/2X4 (GAUZE/BANDAGES/DRESSINGS) IMPLANT
SUT ETHILON 3 0 PS 1 (SUTURE) IMPLANT
SUT NURALON 4 0 TR CR/8 (SUTURE) IMPLANT
SUT SILK 0 TIES 10X30 (SUTURE) IMPLANT
SUT SILK 3 0 SH 30 (SUTURE) IMPLANT
SUT VIC AB 3-0 SH 27 (SUTURE)
SUT VIC AB 3-0 SH 27X BRD (SUTURE) IMPLANT
SUT VIC AB 3-0 SH 8-18 (SUTURE) IMPLANT
TOWEL GREEN STERILE (TOWEL DISPOSABLE) ×6 IMPLANT
TOWEL GREEN STERILE FF (TOWEL DISPOSABLE) ×3 IMPLANT
WATER STERILE IRR 1000ML POUR (IV SOLUTION) ×3 IMPLANT

## 2018-01-01 NOTE — Progress Notes (Addendum)
PROGRESS NOTE    Justin Ball  WUJ:[id number] DOB: [birth year] DOA: 12/30/2017 PCP: Randall Hiss, MD    Brief Narrative:  Justin Ball is a 27 y.o. 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  He was placed on bactrim which he has taken 3 doses.  The rash has gotten worse and is tracking up along where his shunt is under his skin along his chest wall.  Pt came in for fever and chills.  Denies any urinary symptoms.  No cough.  No sob.  Pt referred for admission for cellulitis associated with his vp shunt.  Both ID and neurosurg called and will both see in the am.   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  Presents with fever, leukocytosis, tachycardia, tachypnea.  Continue with vancomycin and ceftriaxone change to ceftazidime 1-07. Discontinue cefta Neurosurgery consulted. Planning VP shunt removal, placement of EVD.  Continue with IV fluids.  Follow urine culture, blood culture. Growing staph, methicillin resistant. Ceftazidime discontinue.   2-Abdominal Pain; Nausea, vomiting;  KUB with sign of SBO vs ileus.  He is very tender to palpation.  Concern with peritonitis. Continue with IV antibiotics. Will discussed with neurosurgery regarding patient condition. Will consult general sx  Change morphine to Q 2 Hr PRN.   AIDS: Continue with [place], [place].  ID consulted.  Seizure: Continue with Keppra. Change to IV due to NPO.    Hypokalemia; await labs for the morning.   S/P VP Shunt. Neurosurgery has been consulted due to infection.   Chronic viral hepatitis. Follow by ID.   Bipolar; on Seroquel.     DVT prophylaxis: SCD Code Status: Full code.  Family Communication: mother at bedside 1-07.  Disposition Plan: remain inpatient.     Consultants:   Neurosurgery   ID   Procedures:   none   Antimicrobials:   Vancomycin 1-6  Ceftriaxone 1-06--107---Ceftazidime 1-07---1-08  Bactrim 1-06.    Subjective: He was vomiting last night, complaining of abdominal pain and had Diarrhea.    Objective: Vitals:   12/31/17 2351 01/01/18 0339 01/01/18 0344 01/01/18 0711  BP:  115/76    Pulse:  (!) 113    Resp:  (!) 26    Temp: (!) 101.1 F (38.4 C) 100.2 F (37.9 C) (!) 100.9 F (38.3 C) 100.1 F (37.8 C)  TempSrc:  Oral Axillary Oral  SpO2:  97%    Weight:      Height:        Intake/Output Summary (Last 24 hours) at 01/01/2018 0831 Last data filed at 01/01/2018 0635 Gross per 24 hour  Intake 1400 ml  Output 1750 ml  Net -350 ml   Filed Weights   12/30/17 1808 12/31/17 0758  Weight: 81.6 kg (180 lb) 78.8 kg (173 lb 11.6 oz)    Examination:  General exam: alert, in mild distress due to pain.  Respiratory system: Normal respiratory effort, CTA  Cardiovascular system: S 1, S 2 RRR, no edema Gastrointestinal system: Distended, no BS, Very tender to palpation. Suprapubic catheter place.  Central nervous system: Alert and oriented.  Extremities: symmetric power.  Skin: redness chest and abdominal wall.     Data Reviewed: I have personally reviewed following labs and imaging studies  CBC: Recent Labs  Lab 12/30/17 1830 12/31/17 0439  WBC 27.7* 29.5*  NEUTROABS 23.9*  --   HGB 12.4* 12.3*  HCT 37.1* 35.7*  MCV 95.1 94.7  PLT 403* 360   Basic Metabolic Panel: Recent Labs  Lab 12/30/17 1830 12/31/17 0439  NA 132* 135  K 3.1* 2.8*  CL 98* 105  CO2 23 20*  GLUCOSE 116*  98  BUN 6 7  CREATININE 1.33* 1.21  CALCIUM 8.9 8.1*   GFR: Estimated Creatinine Clearance: 103.1 mL/min (by C-G formula based on SCr of 1.21 mg/dL). Liver Function Tests: Recent Labs  Lab 12/30/17 1830  AST 44*  ALT 35  ALKPHOS 96  BILITOT 1.8*  PROT 9.0*  ALBUMIN 3.1*   No results for input(s): LIPASE, AMYLASE in the last 168 hours. No results for input(s): AMMONIA in the last 168 hours. Coagulation Profile: Recent Labs  Lab 12/30/17 1830  INR 1.14   Cardiac Enzymes: No results for input(s): CKTOTAL, CKMB, CKMBINDEX, TROPONINI in the last 168 hours. BNP (last 3 results) No results for input(s): PROBNP in the last 8760 hours. HbA1C: No results for input(s): HGBA1C in the last 72 hours. CBG: No results for input(s): GLUCAP in the last 168 hours. Lipid Profile: No results for input(s): CHOL, HDL, LDLCALC, TRIG, CHOLHDL, LDLDIRECT in the last 72 hours. Thyroid Function Tests: No results for input(s): TSH, T4TOTAL, FREET4, T3FREE, THYROIDAB in the last 72 hours. Anemia Panel: No results for input(s): VITAMINB12, FOLATE, FERRITIN, TIBC, IRON, RETICCTPCT in the last 72 hours. Sepsis Labs: Recent Labs  Lab 12/30/17 1848 12/30/17 2042  LATICACIDVEN 1.54 1.72    Recent Results (from the past 240 hour(s))  Culture, blood (Routine x 2)     Status: None (Preliminary result)   Collection Time: 12/30/17  6:30 PM  Result Value Ref Range Status   Specimen Description BLOOD RIGHT ANTECUBITAL  Final   Special Requests   Final    BOTTLES DRAWN AEROBIC AND ANAEROBIC Blood Culture adequate volume   Culture  Setup Time   Final    GRAM POSITIVE COCCI ANAEROBIC BOTTLE ONLY CRITICAL VALUE NOTED.  VALUE IS CONSISTENT WITH PREVIOUSLY REPORTED AND CALLED VALUE.    Culture PENDING  Incomplete   Report Status PENDING  Incomplete  Culture, blood (Routine x 2)     Status: None (Preliminary result)   Collection Time: 12/30/17  6:40 PM  Result Value Ref Range Status   Specimen  Description BLOOD LEFT ANTECUBITAL  Final   Special Requests   Final    BOTTLES DRAWN AEROBIC AND ANAEROBIC Blood Culture adequate volume   Culture  Setup Time   Final    GRAM POSITIVE COCCI IN CLUSTERS IN BOTH AEROBIC AND ANAEROBIC BOTTLES CRITICAL RESULT CALLED TO, READ BACK BY AND VERIFIED WITH: A. MYER PHARMD, AT 2021 12/31/17 BY D. VANHOOK    Culture GRAM POSITIVE COCCI  Final   Report Status PENDING  Incomplete  Blood Culture ID Panel (Reflexed)     Status: Abnormal   Collection Time: 12/30/17  6:40 PM  Result Value Ref Range Status   Enterococcus species NOT DETECTED NOT DETECTED Final   Listeria monocytogenes NOT DETECTED NOT  DETECTED Final   Staphylococcus species DETECTED (A) NOT DETECTED Final    Comment: Methicillin (oxacillin) resistant coagulase negative staphylococcus. Possible blood culture contaminant (unless isolated from more than one blood culture draw or clinical case suggests pathogenicity). No antibiotic treatment is indicated for blood  culture contaminants. CRITICAL RESULT CALLED TO, READ BACK BY AND VERIFIED WITH: A. MYER PHARMD, AT 2021 12/31/17 BY D. VANHOOK    Staphylococcus aureus NOT DETECTED NOT DETECTED Final   Methicillin resistance DETECTED (A) NOT DETECTED Final    Comment: CRITICAL RESULT CALLED TO, READ BACK BY AND VERIFIED WITH: A. MYER PHARMD, AT 2021 12/31/17 BY D. VANHOOK    Streptococcus species NOT DETECTED NOT DETECTED Final   Streptococcus agalactiae NOT DETECTED NOT DETECTED Final   Streptococcus pneumoniae NOT DETECTED NOT DETECTED Final   Streptococcus pyogenes NOT DETECTED NOT DETECTED Final   Acinetobacter baumannii NOT DETECTED NOT DETECTED Final   Enterobacteriaceae species NOT DETECTED NOT DETECTED Final   Enterobacter cloacae complex NOT DETECTED NOT DETECTED Final   Escherichia coli NOT DETECTED NOT DETECTED Final   Klebsiella oxytoca NOT DETECTED NOT DETECTED Final   Klebsiella pneumoniae NOT DETECTED NOT DETECTED Final   Proteus  species NOT DETECTED NOT DETECTED Final   Serratia marcescens NOT DETECTED NOT DETECTED Final   Haemophilus influenzae NOT DETECTED NOT DETECTED Final   Neisseria meningitidis NOT DETECTED NOT DETECTED Final   Pseudomonas aeruginosa NOT DETECTED NOT DETECTED Final   Candida albicans NOT DETECTED NOT DETECTED Final   Candida glabrata NOT DETECTED NOT DETECTED Final   Candida krusei NOT DETECTED NOT DETECTED Final   Candida parapsilosis NOT DETECTED NOT DETECTED Final   Candida tropicalis NOT DETECTED NOT DETECTED Final  MRSA PCR Screening     Status: None   Collection Time: 12/31/17  8:38 AM  Result Value Ref Range Status   MRSA by PCR NEGATIVE NEGATIVE Final    Comment:        The GeneXpert MRSA Assay (FDA approved for NASAL specimens only), is one component of a comprehensive MRSA colonization surveillance program. It is not intended to diagnose MRSA infection nor to guide or monitor treatment for MRSA infections.          Radiology Studies: Dg Chest 2 View  Result Date: 12/30/2017 CLINICAL DATA:  Pt here for chest pain. Had shunt placed in May that drains to stomach. Area to anterior chest and R lat neck is red and very tender to palpation. EXAM: CHEST  2 VIEW COMPARISON:  07/12/2017 FINDINGS: Patient has a right-sided ventriculoperitoneal shunt, its visualized course unremarkable. The heart size is normal. The lungs are free of focal consolidations and pleural effusions. IMPRESSION: No active cardiopulmonary disease. Electronically Signed   By: Norva Pavlov M.D.   On: 12/30/2017 19:36   Ct Head Wo Contrast  Result Date: 12/30/2017 CLINICAL DATA:  27 y/o M; history of cryptococcal meningitis post VP shunt. Febrile. Cellulitis along course of VP shunt catheter. Headache. EXAM: CT HEAD WITHOUT CONTRAST TECHNIQUE: Contiguous axial images were obtained from the base of the skull through the vertex without intravenous contrast. COMPARISON:  06/04/2017 CT head.  05/25/2017 MRI  head. FINDINGS: Brain: Linear lucent track traversing right frontal lobe into right caudate nucleus head from prior catheter insertion. Right frontal approach ventriculostomy catheter tip is in frontal horn of right lateral ventricle. Small lucencies present in right posterior limb of internal capsule, left caudate head, and left occipital lobe correspond to lesions on prior MRI of the brain.  No evidence for new large acute infarct, hemorrhage, or focal mass effect. No hydrocephalus. No effacement of basilar cisterns. Vascular: No hyperdense vessel or unexpected calcification. Skull: Resolution of postsurgical changes related to right frontal burr hole. No acute osseous abnormality. Sinuses/Orbits: No acute finding. Other: None. IMPRESSION: 1. No acute intracranial abnormality identified. 2. Right frontal approach ventriculostomy catheter with tip in the frontal horn of right lateral ventricle. No hydrocephalus. 3. Small lucent foci are present within the right posterior limb of internal capsule, left caudate head, and left occipital cortex corresponding to lesions on prior MRI of the brain likely representing postinflammatory sequelae. Electronically Signed   By: Mitzi Hansen M.D.   On: 12/30/2017 23:08   Ct Soft Tissue Neck W Contrast  Result Date: 12/30/2017 CLINICAL DATA:  History of cryptococcal meningitis status post VP shunt. Cellulitis along the course of the shunt with soft tissue swelling in the lower neck. Febrile. EXAM: CT NECK WITH CONTRAST TECHNIQUE: Multidetector CT imaging of the neck was performed using the standard protocol following the bolus administration of intravenous contrast. CONTRAST:  ISOVUE-300 IOPAMIDOL (ISOVUE-300) INJECTION 61% COMPARISON:  Neck CT 09/15/2016 FINDINGS: Pharynx and larynx: Symmetric pharyngeal soft tissues without evidence of mass. Fluid collections involving the floor of mouth, oropharynx, narrowed nasopharynx, and soft palate on the prior neck CT  have resolved. The larynx is unremarkable. Salivary glands: No inflammation, mass, or stone. Thyroid: Unremarkable. Lymph nodes: No enlarged or suspicious lymph nodes in the neck. Vascular: Major vascular structures of the neck appear patent. Limited intracranial: Small amount of focal low-density partially visualized involving anterior frontal cortex bilaterally and possibly cerebellum which may reflect sequelae of prior infection or infarct. Visualized orbits: Unremarkable. Mastoids and visualized paranasal sinuses: Clear. Skeleton: No acute osseous abnormality or suspicious osseous lesion. Upper chest: Clear lung apices. Other: The patient's VP shunt catheter courses through the right lateral and anterior lower neck and into the upper chest wall. No fluid collection is identified along the catheter tract. IMPRESSION: No fluid collection identified along the VP shunt catheter. Electronically Signed   By: Sebastian Ache M.D.   On: 12/30/2017 22:06   Ct Abdomen Pelvis W Contrast  Result Date: 12/30/2017 CLINICAL DATA:  VP shunt infection from abdomen to chest. Rule out intra-abdominal pseudocyst. EXAM: CT ABDOMEN AND PELVIS WITH CONTRAST TECHNIQUE: Multidetector CT imaging of the abdomen and pelvis was performed using the standard protocol following bolus administration of intravenous contrast. CONTRAST:  100 cc Isovue-300 IV COMPARISON:  Abdominal CT 03/03/2017 FINDINGS: Lower chest: The lung bases are clear. Hepatobiliary: No focal liver abnormality is seen. No gallstones, gallbladder wall thickening, or biliary dilatation. Pancreas: No ductal dilatation or inflammation. Spleen: Normal in size without focal abnormality. Adrenals/Urinary Tract: Adrenal glands are unremarkable. Kidneys are normal, without renal calculi, focal lesion, or hydronephrosis. Bladder is decompressed by suprapubic tube. Stomach/Bowel: The stomach is nondistended. Lack of enteric contrast limits bowel evaluation. Sigmoid colon is tortuous.  Possible colonic wall thickening the descending/sigmoid colonic junction for example image 84 series 8. No evidence of bowel obstruction. Liquid stool in the ascending colon without colonic inflammation or wall thickening. Vascular/Lymphatic: Shotty retroperitoneal nodes left periaortic station. Small mesenteric nodes. No acute vascular abnormality. Reproductive: Prostate is unremarkable. Other: Ventriculoperitoneal shunt catheter coursing in the right lower chest and to the right of the midline in the abdomen. There is stranding and small amount of fluid surrounding the shunt catheter in the subcutaneous tissues. Small amount of fluid about the pelvic portion of shunt  catheter tubing best appreciated on coronal and sagittal reformats image 65 and 1 and 3 respectively. Tip of the shunt in the left pelvis without focal fluid collection at the tip. Trace free fluid in the left pericolic gutter and scattered throughout the mesentery. Musculoskeletal: There are no acute or suspicious osseous abnormalities. IMPRESSION: 1. Stranding and fluid about the subcutaneous portion ventriculoperitoneal shunt in the right lower chest and abdomen. Fluid about the intrapelvic portion of the shunt. Infection is considered. 2. Possible bowel wall thickening at the descending sigmoid colonic junction in the region of peritoneal catheter shunt fluid, may be reactive or infectious/inflammatory. 3. Suprapubic tube decompressing the urinary bladder. Electronically Signed   By: Rubye OaksMelanie  Ehinger M.D.   On: 12/30/2017 22:59   Dg Abd Portable 1v  Result Date: 01/01/2018 CLINICAL DATA:  NG tube placement. EXAM: PORTABLE ABDOMEN - 1 VIEW COMPARISON:  Radiographs earlier this day. FINDINGS: Tip of the enteric tube in the stomach, side-port in the region of the distal esophagus. Decreased gaseous gastric distention from prior. Persistent small bowel dilatation. IMPRESSION: Tip of the enteric tube in the stomach, side-port in the region of the  distal esophagus. Recommend advancement of at least 5 cm for optimal placement. Decreased gaseous gastric distension with persistent small bowel dilatation. Electronically Signed   By: Rubye OaksMelanie  Ehinger M.D.   On: 01/01/2018 03:41   Dg Abd Portable 1v  Result Date: 01/01/2018 CLINICAL DATA:  Nausea and vomiting.  Status post VP shunt. EXAM: PORTABLE ABDOMEN - 1 VIEW COMPARISON:  Abdominal CT 12/30/2017 FINDINGS: New gaseous gastric distension. New small bowel dilatation up to 4.2 cm. No evidence of free air. Ventriculoperitoneal shunt catheter tubing in the right abdomen terminating in the pelvis. IMPRESSION: New gaseous gastric distention and small bowel dilatation, ileus versus early small bowel obstruction. Electronically Signed   By: Rubye OaksMelanie  Ehinger M.D.   On: 01/01/2018 00:42        Scheduled Meds: . dolutegravir  50 mg Oral Daily  . emtricitabine-rilpivir-tenofovir AF  1 tablet Oral Q breakfast  . [START ON 01/02/2018] sulfamethoxazole-trimethoprim  1 tablet Oral Once per day on Mon Wed Fri   Continuous Infusions: . sodium chloride 100 mL/hr at 12/31/17 0951  . fluconazole (DIFLUCAN) IV    . levETIRAcetam    . vancomycin 1,000 mg (01/01/18 0605)     LOS: 1 day    Time spent: 35 minutes.     Alba CoryBelkys A Nikol Lemar, MD [facility] Pager [phone]  If 7PM-7AM, please contact night-coverage [email or web address] Password [email or web address] 01/01/2018, 8:31 AM

## 2018-01-01 NOTE — Progress Notes (Signed)
Neurosurgery Progress Note  No issues overnight. Only concern is pain from NG tube Denies any headache  EXAM:  BP 123/76   Pulse (!) 127   Temp 100.1 F (37.8 C) (Oral)   Resp (!) 27   Ht 6\' 1"  (1.854 m)   Wt 78.8 kg (173 lb 11.6 oz)   SpO2 95%   BMI 22.92 kg/m   Awake, alert, oriented to person, location and year Speech fluent, appropriate  MAEW with good strength Remains TTP along shunt tract  PLAN Stable this am. Will proceed with removal of VP shunt and placement of EVD by Dr Conchita ParisNundkumar later today. Dr Conchita ParisNundkumar to discuss risks/benefits later with mother. Patient to go to ICU post op due to EVD drain

## 2018-01-01 NOTE — Transfer of Care (Signed)
Immediate Anesthesia Transfer of Care Note  Patient: Justin Ball  Procedure(s) Performed: REMOVAL OF V-P SHUNT, PLACEMENT OF EVD (N/A Head)  Patient Location: PACU  Anesthesia Type:General  Level of Consciousness: awake, alert  and oriented  Airway & Oxygen Therapy: Patient Spontanous Breathing and Patient connected to face mask oxygen  Post-op Assessment: Report given to RN and Post -op Vital signs reviewed and stable  Post vital signs: Reviewed and stable  Last Vitals:  Vitals:   01/01/18 1523 01/01/18 2007  BP: 136/84 133/89  Pulse: (!) 130 (!) 125  Resp: (!) 27 (!) 26  Temp: (!) 39.3 C (!) 36.3 C  SpO2:  94%    Last Pain:  Vitals:   01/01/18 1523  TempSrc: Oral  PainSc:       Patients Stated Pain Goal: 0 (01/01/18 0017)  Complications: No apparent anesthesia complications

## 2018-01-01 NOTE — Consult Note (Signed)
[facility] Consult Note  Justin Ball Oct 28, 1991  [id number].    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 Presented to PCP with rash along location of shunt, and started on bactrim. Rash progressed and worsened and patient developed fever/chills. Patient reports abdominal pain started 2 days ago, intermittent, sharp, generalized. Reports associated n/v. Last BM yesterday, was passing flatus yesterday. Has not passed flatus today. Neurosurgery planning shunt removal today. NGT placed late yesterday, pain is unchanged since placement.   ROS: Review of Systems  Constitutional: Positive for chills and fever.  Eyes: Positive for blurred vision.  Gastrointestinal: Positive for abdominal pain, nausea and vomiting. Negative for constipation and diarrhea.  Skin: Positive for rash.  All other systems reviewed and are negative.   Family History  Problem Relation Age of Onset  . Hypertension Other     Past Medical History:  Diagnosis Date  . 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 Anxiety   . 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 Bipolar 1 disorder ([place])   . 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 Cognitive communication deficit    working w/ therapy  . 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 Depression   . 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 Fecal incontinence 11/19/2017  . Foley catheter in place   . 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 History of scabies 2013  . History of syphilis   . 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 Neurogenic bowel    wears depends  . Neuromuscular disorder ([place])   . 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 Seizure disorder [facility])    neurologist-  dr Leta Baptist ([facility])--caused by cryptococcal meningitis/ multiple infarctions--  no seizure since hospitalization May 2018  . Status epilepticus ([place])   . Visuospatial deficit     Past Surgical History:  Procedure Laterality Date  . CYSTOSCOPY N/A 10/10/2017   Procedure: CYSTOSCOPY;  Surgeon: Alexis Frock, MD;  Location: [facility];  Service: Urology;  Laterality: N/A;  . EXAMINATION UNDER ANESTHESIA N/A 12/28/2014   Procedure: EXAM UNDER ANESTHESIA;  Surgeon: Michael Boston, MD;  Location: [facility];  Service: General;  Laterality: N/A;  . FLOOR OF MOUTH BIOPSY N/A 09/16/2016   Procedure: BIOPSY OF ORAL ABCESS;  Surgeon: Leta Baptist, MD;  Location: [place];  Service: ENT;  Laterality: N/A;  . INCISION AND DRAINAGE ABSCESS N/A 09/16/2016   Procedure: INCISION AND DRAINAGE ABSCESS;  Surgeon: Leta Baptist, MD;  Location: [place];  Service: ENT;  Laterality: N/A;  . [place]  09/ 03/ 2011   dr toth  . INCISION AND DRAINAGE PERIRECTAL ABSCESS N/A 12/28/2014   Procedure: IRRIGATION AND DEBRIDEMENT PERIRECTAL ABSCESS;  Surgeon: Michael Boston, MD;  Location: [facility];  Service: General;  Laterality: N/A;  Fistula repair and ablation  . INSERTION OF SUPRAPUBIC CATHETER N/A 10/10/2017   Procedure: INSERTION OF SUPRAPUBIC CATHETER;  Surgeon: Alexis Frock, MD;  Location: [facility];  Service: Urology;  Laterality: N/A;  . LAPAROSCOPIC REVISION VENTRICULAR-PERITONEAL (V-P) SHUNT N/A 06/12/2017   Procedure:  LAPAROSCOPIC REVISION VENTRICULAR-PERITONEAL (V-P) SHUNT;  Surgeon: Kinsinger, Arta Bruce, MD;  Location: [place];  Service: General;  Laterality: N/A;  . TRANSTHORACIC ECHOCARDIOGRAM  06/02/2017   EF 65-70%/  trivial PR  . VENTRICULOPERITONEAL SHUNT N/A 06/12/2017   Procedure: SHUNT INSERTION VENTRICULAR-PERITONEAL;  Surgeon: Consuella Lose, MD;  Location: [place];  Service: Neurosurgery;  Laterality: N/A;    Social History:  reports that he has been smoking cigarettes.  He has a 2.50 pack-year smoking history. he has never used smokeless tobacco. He reports that he does not drink alcohol or use drugs.  Allergies:  Allergies  Allergen Reactions  . Acyclovir And Related     Medications Prior to Admission  Medication Sig Dispense Refill  . acetaminophen (TYLENOL) 325 MG tablet Take 2 tablets (650 mg total) by mouth every 4 (four) hours as needed for mild pain (temp > 101.5).    Marland Kitchen dolutegravir (TIVICAY) 50 MG tablet Take 1 tablet (50 mg total) by mouth daily. (Patient taking differently: Take 50 mg by mouth every morning. ) 30 tablet 6  . emtricitabine-rilpivir-tenofovir AF (ODEFSEY) 200-25-25 MG TABS tablet Take 1 tablet by mouth daily with breakfast. (Patient taking differently: Take 1 tablet by mouth daily with breakfast. ) 30 tablet 6  . levETIRAcetam (KEPPRA) 1000 MG tablet Take 1 tablet (1,000 mg total) by mouth 2 (two) times daily. (Patient taking differently: Take 1,000 mg by mouth 2 (two) times daily. ) 60 tablet 5  . QUEtiapine (SEROQUEL) 25 MG tablet Take 1 tablet (25 mg total) by mouth 2 (two) times daily as needed (agitation). (Patient taking differently: Take 25 mg by mouth 2 (two) times daily. ) 60 tablet 4  . sulfamethoxazole-trimethoprim (BACTRIM DS,SEPTRA DS) 800-160 MG tablet Take 1 tablet by mouth 2 (two) times daily. 20 tablet 0  . traMADol (ULTRAM) 50 MG tablet Take 1-2 tablets (50-100 mg total) by mouth every 6 (six) hours as needed for moderate pain or severe pain.  Post-operatively. 15 tablet 0    Blood pressure 123/76, pulse (!) 127, temperature 100.1 F (37.8 C), temperature source Oral, resp. rate (!) 27, height '6\' 1"'  (1.854 m), weight 78.8 kg (173 lb 11.6 oz), SpO2 95 %. Physical Exam: Physical Exam  Constitutional: He is oriented to person, place, and time. He appears well-developed and well-nourished. He is cooperative.  Non-toxic appearance.  In obvious discomfort   HENT:  Head: Normocephalic and atraumatic.  Right Ear: External ear normal.  Left Ear: External ear normal.  Nose: Nose normal.  Mouth/Throat: Oropharynx is clear and moist. Mucous membranes are dry.  Eyes: Conjunctivae, EOM and lids are normal. Pupils are equal, round, and reactive to light. No scleral icterus.  Neck: Normal range of motion and phonation normal. Neck supple.  Cardiovascular: Regular rhythm. Tachycardia present.  Pulses:      Radial pulses are 2+ on the right side, and 2+ on the left side.       Dorsalis pedis pulses are 2+ on the right side, and 2+ on the left side.  No LE edema BL   Pulmonary/Chest: Effort normal and breath sounds normal.  Abdominal: Soft. He exhibits distension. Bowel sounds are absent. There is generalized tenderness and  tenderness in the right upper quadrant. There is guarding. There is no rigidity and no rebound.  Musculoskeletal:  ROM grossly intact in BL upper and lower extremities  Neurological: He is alert and oriented to person, place, and time.  Skin: Skin is warm, dry and intact. Rash (along chest ) noted. He is not diaphoretic.  Psychiatric: He has a normal mood and affect. His speech is normal and behavior is normal.    Results for orders placed or performed during the hospital encounter of 12/30/17 (from the past 48 hour(s))  Comprehensive metabolic panel     Status: Abnormal   Collection Time: 12/30/17  6:30 PM  Result Value Ref Range   Sodium 132 (L) 135 - 145 mmol/L   Potassium 3.1 (L) 3.5 - 5.1 mmol/L   Chloride 98 (L)  101 - 111 mmol/L   CO2 23 22 - 32 mmol/L   Glucose, Bld 116 (H) 65 - 99 mg/dL   BUN 6 6 - 20 mg/dL   Creatinine, Ser 1.33 (H) 0.61 - 1.24 mg/dL   Calcium 8.9 8.9 - 10.3 mg/dL   Total Protein 9.0 (H) 6.5 - 8.1 g/dL   Albumin 3.1 (L) 3.5 - 5.0 g/dL   AST 44 (H) 15 - 41 U/L   ALT 35 17 - 63 U/L   Alkaline Phosphatase 96 38 - 126 U/L   Total Bilirubin 1.8 (H) 0.3 - 1.2 mg/dL   GFR calc non Af Amer >60 >60 mL/min   GFR calc Af Amer >60 >60 mL/min    Comment: (NOTE) The eGFR has been calculated using the CKD EPI equation. This calculation has not been validated in all clinical situations. eGFR's persistently <60 mL/min signify possible Chronic Kidney Disease.    Anion gap 11 5 - 15  CBC with Differential     Status: Abnormal   Collection Time: 12/30/17  6:30 PM  Result Value Ref Range   WBC 27.7 (H) 4.0 - 10.5 K/uL   RBC 3.90 (L) 4.22 - 5.81 MIL/uL   Hemoglobin 12.4 (L) 13.0 - 17.0 g/dL   HCT 37.1 (L) 39.0 - 52.0 %   MCV 95.1 78.0 - 100.0 fL   MCH 31.8 26.0 - 34.0 pg   MCHC 33.4 30.0 - 36.0 g/dL   RDW 13.5 11.5 - 15.5 %   Platelets 403 (H) 150 - 400 K/uL   Neutrophils Relative % 85 %    Comment: CORRECTED ON 01/06 AT 2044: PREVIOUSLY REPORTED AS 0   Lymphocytes Relative 3 %    Comment: CORRECTED ON 01/06 AT 2044: PREVIOUSLY REPORTED AS 0   Monocytes Relative 11 %    Comment: CORRECTED ON 01/06 AT 2044: PREVIOUSLY REPORTED AS 0   Eosinophils Relative 0 %   Basophils Relative 0 %   Band Neutrophils 0 %   Metamyelocytes Relative 0 %   Myelocytes 1 %   Promyelocytes Absolute 0 %   Blasts 0 %   nRBC 0 0 /100 WBC   Neutro Abs 23.9 (H) 1.7 - 7.7 K/uL    Comment: CORRECTED ON 01/06 AT 2044: PREVIOUSLY REPORTED AS 0.0   Lymphs Abs 0.8 0.7 - 4.0 K/uL    Comment: CORRECTED ON 01/06 AT 2044: PREVIOUSLY REPORTED AS 0.0   Monocytes Absolute 3.0 (H) 0.1 - 1.0 K/uL    Comment: CORRECTED ON 01/06 AT 2044: PREVIOUSLY REPORTED AS 0.0   Eosinophils Absolute 0.0 0.0 - 0.7 K/uL   Basophils  Absolute 0.0 0.0 - 0.1 K/uL  WBC Morphology MILD LEFT SHIFT (1-5% METAS, OCC MYELO, OCC BANDS)    Other 0 %  Protime-INR     Status: None   Collection Time: 12/30/17  6:30 PM  Result Value Ref Range   Prothrombin Time 14.5 11.4 - 15.2 seconds   INR 1.14   Culture, blood (Routine x 2)     Status: None (Preliminary result)   Collection Time: 12/30/17  6:30 PM  Result Value Ref Range   Specimen Description BLOOD RIGHT ANTECUBITAL    Special Requests      BOTTLES DRAWN AEROBIC AND ANAEROBIC Blood Culture adequate volume   Culture  Setup Time      GRAM POSITIVE COCCI ANAEROBIC BOTTLE ONLY CRITICAL VALUE NOTED.  VALUE IS CONSISTENT WITH PREVIOUSLY REPORTED AND CALLED VALUE.    Culture PENDING    Report Status PENDING   Culture, blood (Routine x 2)     Status: None (Preliminary result)   Collection Time: 12/30/17  6:40 PM  Result Value Ref Range   Specimen Description BLOOD LEFT ANTECUBITAL    Special Requests      BOTTLES DRAWN AEROBIC AND ANAEROBIC Blood Culture adequate volume   Culture  Setup Time      GRAM POSITIVE COCCI IN CLUSTERS IN BOTH AEROBIC AND ANAEROBIC BOTTLES CRITICAL RESULT CALLED TO, READ BACK BY AND VERIFIED WITH: A. MYER PHARMD, AT 2021 12/31/17 BY D. VANHOOK    Culture GRAM POSITIVE COCCI    Report Status PENDING   Blood Culture ID Panel (Reflexed)     Status: Abnormal   Collection Time: 12/30/17  6:40 PM  Result Value Ref Range   Enterococcus species NOT DETECTED NOT DETECTED   Listeria monocytogenes NOT DETECTED NOT DETECTED   Staphylococcus species DETECTED (A) NOT DETECTED    Comment: Methicillin (oxacillin) resistant coagulase negative staphylococcus. Possible blood culture contaminant (unless isolated from more than one blood culture draw or clinical case suggests pathogenicity). No antibiotic treatment is indicated for blood  culture contaminants. CRITICAL RESULT CALLED TO, READ BACK BY AND VERIFIED WITH: A. MYER PHARMD, AT 2021 12/31/17 BY D. VANHOOK     Staphylococcus aureus NOT DETECTED NOT DETECTED   Methicillin resistance DETECTED (A) NOT DETECTED    Comment: CRITICAL RESULT CALLED TO, READ BACK BY AND VERIFIED WITH: A. MYER PHARMD, AT 2021 12/31/17 BY D. VANHOOK    Streptococcus species NOT DETECTED NOT DETECTED   Streptococcus agalactiae NOT DETECTED NOT DETECTED   Streptococcus pneumoniae NOT DETECTED NOT DETECTED   Streptococcus pyogenes NOT DETECTED NOT DETECTED   Acinetobacter baumannii NOT DETECTED NOT DETECTED   Enterobacteriaceae species NOT DETECTED NOT DETECTED   Enterobacter cloacae complex NOT DETECTED NOT DETECTED   Escherichia coli NOT DETECTED NOT DETECTED   Klebsiella oxytoca NOT DETECTED NOT DETECTED   Klebsiella pneumoniae NOT DETECTED NOT DETECTED   Proteus species NOT DETECTED NOT DETECTED   Serratia marcescens NOT DETECTED NOT DETECTED   Haemophilus influenzae NOT DETECTED NOT DETECTED   Neisseria meningitidis NOT DETECTED NOT DETECTED   Pseudomonas aeruginosa NOT DETECTED NOT DETECTED   Candida albicans NOT DETECTED NOT DETECTED   Candida glabrata NOT DETECTED NOT DETECTED   Candida krusei NOT DETECTED NOT DETECTED   Candida parapsilosis NOT DETECTED NOT DETECTED   Candida tropicalis NOT DETECTED NOT DETECTED  I-Stat CG4 Lactic Acid, ED     Status: None   Collection Time: 12/30/17  6:48 PM  Result Value Ref Range   Lactic Acid, Venous 1.54 0.5 - 1.9 mmol/L  I-Stat CG4 Lactic Acid, ED     Status: None   Collection Time: 12/30/17  8:42 PM  Result Value Ref Range   Lactic Acid, Venous 1.72 0.5 - 1.9 mmol/L  Urinalysis, Routine w reflex microscopic     Status: Abnormal   Collection Time: 12/30/17  9:19 PM  Result Value Ref Range   Color, Urine AMBER (A) YELLOW    Comment: BIOCHEMICALS MAY BE AFFECTED BY COLOR   APPearance HAZY (A) CLEAR   Specific Gravity, Urine 1.028 1.005 - 1.030   pH 5.0 5.0 - 8.0   Glucose, UA NEGATIVE NEGATIVE mg/dL   Hgb urine dipstick SMALL (A) NEGATIVE   Bilirubin Urine SMALL  (A) NEGATIVE   Ketones, ur NEGATIVE NEGATIVE mg/dL   Protein, ur 100 (A) NEGATIVE mg/dL   Nitrite NEGATIVE NEGATIVE   Leukocytes, UA MODERATE (A) NEGATIVE   RBC / HPF 0-5 0 - 5 RBC/hpf   WBC, UA TOO NUMEROUS TO COUNT 0 - 5 WBC/hpf   Bacteria, UA RARE (A) NONE SEEN   Squamous Epithelial / LPF NONE SEEN NONE SEEN   Mucus PRESENT   Basic metabolic panel     Status: Abnormal   Collection Time: 12/31/17  4:39 AM  Result Value Ref Range   Sodium 135 135 - 145 mmol/L   Potassium 2.8 (L) 3.5 - 5.1 mmol/L   Chloride 105 101 - 111 mmol/L   CO2 20 (L) 22 - 32 mmol/L   Glucose, Bld 98 65 - 99 mg/dL   BUN 7 6 - 20 mg/dL   Creatinine, Ser 1.21 0.61 - 1.24 mg/dL   Calcium 8.1 (L) 8.9 - 10.3 mg/dL   GFR calc non Af Amer >60 >60 mL/min   GFR calc Af Amer >60 >60 mL/min    Comment: (NOTE) The eGFR has been calculated using the CKD EPI equation. This calculation has not been validated in all clinical situations. eGFR's persistently <60 mL/min signify possible Chronic Kidney Disease.    Anion gap 10 5 - 15  CBC     Status: Abnormal   Collection Time: 12/31/17  4:39 AM  Result Value Ref Range   WBC 29.5 (H) 4.0 - 10.5 K/uL    Comment: REPEATED TO VERIFY   RBC 3.77 (L) 4.22 - 5.81 MIL/uL   Hemoglobin 12.3 (L) 13.0 - 17.0 g/dL   HCT 35.7 (L) 39.0 - 52.0 %   MCV 94.7 78.0 - 100.0 fL   MCH 32.6 26.0 - 34.0 pg   MCHC 34.5 30.0 - 36.0 g/dL   RDW 13.9 11.5 - 15.5 %   Platelets 360 150 - 400 K/uL    Comment: REPEATED TO VERIFY  MRSA PCR Screening     Status: None   Collection Time: 12/31/17  8:38 AM  Result Value Ref Range   MRSA by PCR NEGATIVE NEGATIVE    Comment:        The GeneXpert MRSA Assay (FDA approved for NASAL specimens only), is one component of a comprehensive MRSA colonization surveillance program. It is not intended to diagnose MRSA infection nor to guide or monitor treatment for MRSA infections.   Urine Culture     Status: None (Preliminary result)   Collection Time:  12/31/17 10:54 AM  Result Value Ref Range   Specimen Description URINE, CLEAN CATCH    Special Requests NONE    Culture CULTURE REINCUBATED FOR BETTER GROWTH    Report Status PENDING   C-reactive protein     Status: Abnormal  Collection Time: 12/31/17  9:11 PM  Result Value Ref Range   CRP 30.6 (H) <1.0 mg/dL  CBC     Status: Abnormal   Collection Time: 01/01/18  8:42 AM  Result Value Ref Range   WBC 42.7 (H) 4.0 - 10.5 K/uL    Comment: REPEATED TO VERIFY   RBC 3.47 (L) 4.22 - 5.81 MIL/uL   Hemoglobin 11.2 (L) 13.0 - 17.0 g/dL    Comment: REPEATED TO VERIFY   HCT 32.9 (L) 39.0 - 52.0 %   MCV 94.8 78.0 - 100.0 fL   MCH 32.3 26.0 - 34.0 pg   MCHC 34.0 30.0 - 36.0 g/dL   RDW 14.4 11.5 - 15.5 %   Platelets 375 150 - 400 K/uL  Basic metabolic panel     Status: Abnormal   Collection Time: 01/01/18  8:42 AM  Result Value Ref Range   Sodium 136 135 - 145 mmol/L   Potassium 3.2 (L) 3.5 - 5.1 mmol/L   Chloride 104 101 - 111 mmol/L   CO2 22 22 - 32 mmol/L   Glucose, Bld 117 (H) 65 - 99 mg/dL   BUN 5 (L) 6 - 20 mg/dL   Creatinine, Ser 1.02 0.61 - 1.24 mg/dL   Calcium 8.5 (L) 8.9 - 10.3 mg/dL   GFR calc non Af Amer >60 >60 mL/min   GFR calc Af Amer >60 >60 mL/min    Comment: (NOTE) The eGFR has been calculated using the CKD EPI equation. This calculation has not been validated in all clinical situations. eGFR's persistently <60 mL/min signify possible Chronic Kidney Disease.    Anion gap 10 5 - 15   Dg Chest 2 View  Result Date: 12/30/2017 CLINICAL DATA:  Pt here for chest pain. Had shunt placed in May that drains to stomach. Area to anterior chest and R lat neck is red and very tender to palpation. EXAM: CHEST  2 VIEW COMPARISON:  07/12/2017 FINDINGS: Patient has a right-sided ventriculoperitoneal shunt, its visualized course unremarkable. The heart size is normal. The lungs are free of focal consolidations and pleural effusions. IMPRESSION: No active cardiopulmonary disease.  Electronically Signed   By: Nolon Nations M.D.   On: 12/30/2017 19:36   Ct Head Wo Contrast  Result Date: 12/30/2017 CLINICAL DATA:  27 y/o M; history of cryptococcal meningitis post VP shunt. Febrile. Cellulitis along course of VP shunt catheter. Headache. EXAM: CT HEAD WITHOUT CONTRAST TECHNIQUE: Contiguous axial images were obtained from the base of the skull through the vertex without intravenous contrast. COMPARISON:  06/04/2017 CT head.  05/25/2017 MRI head. FINDINGS: Brain: Linear lucent track traversing right frontal lobe into right caudate nucleus head from prior catheter insertion. Right frontal approach ventriculostomy catheter tip is in frontal horn of right lateral ventricle. Small lucencies present in right posterior limb of internal capsule, left caudate head, and left occipital lobe correspond to lesions on prior MRI of the brain. No evidence for new large acute infarct, hemorrhage, or focal mass effect. No hydrocephalus. No effacement of basilar cisterns. Vascular: No hyperdense vessel or unexpected calcification. Skull: Resolution of postsurgical changes related to right frontal burr hole. No acute osseous abnormality. Sinuses/Orbits: No acute finding. Other: None. IMPRESSION: 1. No acute intracranial abnormality identified. 2. Right frontal approach ventriculostomy catheter with tip in the frontal horn of right lateral ventricle. No hydrocephalus. 3. Small lucent foci are present within the right posterior limb of internal capsule, left caudate head, and left occipital cortex corresponding to lesions on prior  MRI of the brain likely representing postinflammatory sequelae. Electronically Signed   By: Kristine Garbe M.D.   On: 12/30/2017 23:08   Ct Soft Tissue Neck W Contrast  Result Date: 12/30/2017 CLINICAL DATA:  History of cryptococcal meningitis status post VP shunt. Cellulitis along the course of the shunt with soft tissue swelling in the lower neck. Febrile. EXAM: CT NECK  WITH CONTRAST TECHNIQUE: Multidetector CT imaging of the neck was performed using the standard protocol following the bolus administration of intravenous contrast. CONTRAST:  145m ISOVUE-300 IOPAMIDOL (ISOVUE-300) INJECTION 61% COMPARISON:  Neck CT 09/15/2016 FINDINGS: Pharynx and larynx: Symmetric pharyngeal soft tissues without evidence of mass. Fluid collections involving the floor of mouth, oropharynx, narrowed nasopharynx, and soft palate on the prior neck CT have resolved. The larynx is unremarkable. Salivary glands: No inflammation, mass, or stone. Thyroid: Unremarkable. Lymph nodes: No enlarged or suspicious lymph nodes in the neck. Vascular: Major vascular structures of the neck appear patent. Limited intracranial: Small amount of focal low-density partially visualized involving anterior frontal cortex bilaterally and possibly cerebellum which may reflect sequelae of prior infection or infarct. Visualized orbits: Unremarkable. Mastoids and visualized paranasal sinuses: Clear. Skeleton: No acute osseous abnormality or suspicious osseous lesion. Upper chest: Clear lung apices. Other: The patient's VP shunt catheter courses through the right lateral and anterior lower neck and into the upper chest wall. No fluid collection is identified along the catheter tract. IMPRESSION: No fluid collection identified along the VP shunt catheter. Electronically Signed   By: ALogan BoresM.D.   On: 12/30/2017 22:06   Ct Abdomen Pelvis W Contrast  Result Date: 12/30/2017 CLINICAL DATA:  VP shunt infection from abdomen to chest. Rule out intra-abdominal pseudocyst. EXAM: CT ABDOMEN AND PELVIS WITH CONTRAST TECHNIQUE: Multidetector CT imaging of the abdomen and pelvis was performed using the standard protocol following bolus administration of intravenous contrast. CONTRAST:  100 cc Isovue-300 IV COMPARISON:  Abdominal CT 03/03/2017 FINDINGS: Lower chest: The lung bases are clear. Hepatobiliary: No focal liver abnormality is  seen. No gallstones, gallbladder wall thickening, or biliary dilatation. Pancreas: No ductal dilatation or inflammation. Spleen: Normal in size without focal abnormality. Adrenals/Urinary Tract: Adrenal glands are unremarkable. Kidneys are normal, without renal calculi, focal lesion, or hydronephrosis. Bladder is decompressed by suprapubic tube. Stomach/Bowel: The stomach is nondistended. Lack of enteric contrast limits bowel evaluation. Sigmoid colon is tortuous. Possible colonic wall thickening the descending/sigmoid colonic junction for example image 84 series 8. No evidence of bowel obstruction. Liquid stool in the ascending colon without colonic inflammation or wall thickening. Vascular/Lymphatic: Shotty retroperitoneal nodes left periaortic station. Small mesenteric nodes. No acute vascular abnormality. Reproductive: Prostate is unremarkable. Other: Ventriculoperitoneal shunt catheter coursing in the right lower chest and to the right of the midline in the abdomen. There is stranding and small amount of fluid surrounding the shunt catheter in the subcutaneous tissues. Small amount of fluid about the pelvic portion of shunt catheter tubing best appreciated on coronal and sagittal reformats image 65 and 1 and 3 respectively. Tip of the shunt in the left pelvis without focal fluid collection at the tip. Trace free fluid in the left pericolic gutter and scattered throughout the mesentery. Musculoskeletal: There are no acute or suspicious osseous abnormalities. IMPRESSION: 1. Stranding and fluid about the subcutaneous portion ventriculoperitoneal shunt in the right lower chest and abdomen. Fluid about the intrapelvic portion of the shunt. Infection is considered. 2. Possible bowel wall thickening at the descending sigmoid colonic junction in the region of peritoneal  catheter shunt fluid, may be reactive or infectious/inflammatory. 3. Suprapubic tube decompressing the urinary bladder. Electronically Signed   By:  Jeb Levering M.D.   On: 12/30/2017 22:59   Dg Abd Portable 1v  Result Date: 01/01/2018 CLINICAL DATA:  NG tube placement. EXAM: PORTABLE ABDOMEN - 1 VIEW COMPARISON:  Radiographs earlier this day. FINDINGS: Tip of the enteric tube in the stomach, side-port in the region of the distal esophagus. Decreased gaseous gastric distention from prior. Persistent small bowel dilatation. IMPRESSION: Tip of the enteric tube in the stomach, side-port in the region of the distal esophagus. Recommend advancement of at least 5 cm for optimal placement. Decreased gaseous gastric distension with persistent small bowel dilatation. Electronically Signed   By: Jeb Levering M.D.   On: 01/01/2018 03:41   Dg Abd Portable 1v  Result Date: 01/01/2018 CLINICAL DATA:  Nausea and vomiting.  Status post VP shunt. EXAM: PORTABLE ABDOMEN - 1 VIEW COMPARISON:  Abdominal CT 12/30/2017 FINDINGS: New gaseous gastric distension. New small bowel dilatation up to 4.2 cm. No evidence of free air. Ventriculoperitoneal shunt catheter tubing in the right abdomen terminating in the pelvis. IMPRESSION: New gaseous gastric distention and small bowel dilatation, ileus versus early small bowel obstruction. Electronically Signed   By: Jeb Levering M.D.   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 For now continue bowel rest and decompression with NGT. Continue IV abx.   Brigid Re, [facility] Surgery 01/01/2018, 10:11 AM Pager: [phone] Consults: [phone] Mon-Fri 7:00 am-4:30 pm Sat-Sun 7:00 am-11:30 am

## 2018-01-01 NOTE — Op Note (Signed)
PREOP DIAGNOSIS:  1. Possible shunt infection   POSTOP DIAGNOSIS:  1. Shunt infection  PROCEDURE: 1. Removal of right ventriculoperitoneal shunt 2. Placement of right frontal EVD  SURGEON: Dr. Lisbeth RenshawNeelesh Gian Ybarra, MD  ASSISTANT: None  ANESTHESIA: General Endotracheal  EBL: Minimal  SPECIMENS:  1. Distal shunt tubing for culture 2. CSF for routine analysis  DRAINS: EVD  COMPLICATIONS: None immediate  CONDITION: Stable to PACU  HISTORY: Justin Ball is a 27 y.o. 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  He presented to the emergency department with abdominal pain and pain along the shunt tract along the chest and abdomen.  With these findings and a significant leukocytosis, as well as positive blood cultures, the concern was for shunt infection.  Removal of the shunt and placement of ventricular catheter was therefore indicated.  The risks and benefits of the surgery were explained in detail to the patient's family.  After all questions were answered informed consent was obtained and witnessed.  PROCEDURE IN DETAIL: After informed consent was obtained and witnessed, the patient was brought to the operating room. After induction of general anesthesia, the patient was positioned on the operative table in the supine position. All pressure points were meticulously padded.  Previous right frontal and retroauricular skin incisions were then identified and prepped and draped in the usual sterile fashion.  After timeout was conducted, the retroauricular skin incision was opened sharply.  Bovie electrocautery was used to dissect through subcutaneous tissue.  The distal shunt tubing was identified.  A clamp was placed on the proximal and distal end of the tubing.  The shunt was then cut, and the distal portion was removed.  There was pus along the shunt tubing  which was sent for culture.  At this point the curvilinear right frontal incision was opened sharply.  Bovie was used to dissected subcutaneous tissue.  The ventricular portion of the catheter was then identified.  The subcutaneous pocket for the valve was also opened.  A clamp was placed on the catheter proximal to the valve.  The valve was then removed.  The frontal portion of the catheter was then removed, and a ventricular drain was placed in a parallel trajectory with good spontaneous flow of CSF.  CSF appeared clear.  Sample was sent for routine analysis.  The ventricular catheter was then tunneled subcutaneously.  At this point, the incisions were irrigated with antibiotic saline.  Both incisions were then closed with interrupted 3-0 Vicryl stitches and skin staples.  Drain stitch was applied to the ventricular catheter.  The ventricular catheter was then attached to the collection chamber and clamped.  At the end of the case all sponge, needle, instrument, and cottonoid counts were correct.  The patient was then extubated and taken to the postanesthesia care unit in stable hemodynamic condition.

## 2018-01-01 NOTE — Progress Notes (Signed)
Patient ID: Justin Ball, male   DOB: 08-May-1991, 27 y.o.   MRN: [id number]          [facility]  Date of Admission:  12/30/2017           Day 2 vancomycin        Day 2 ceftazidime ASSESSMENT: He has coagulase-negative staph bacteremia and VP shunt infection.  I will continue vancomycin alone.  Plan is to have his VP shunt removed and converted to an extraventricular drain.  It would be good to send a CSF for analysis.  If he has evidence of ventriculitis that will impact the duration of antibiotic therapy.  PLAN: 1. Continue vancomycin 2. 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 dolutegravir  50 mg Oral Daily  . emtricitabine-rilpivir-tenofovir AF  1 tablet Oral Q breakfast  . [START ON 01/02/2018] sulfamethoxazole-trimethoprim  1 tablet Oral Once per day on Mon Wed Fri   Continuous Infusions: . sodium chloride 100 mL/hr at 01/01/18 0929  . fluconazole (DIFLUCAN) IV    . levETIRAcetam Stopped (01/01/18 0945)  . vancomycin Stopped (01/01/18 0705)   PRN Meds:.acetaminophen, acetaminophen, morphine injection, ondansetron **OR** ondansetron (ZOFRAN) IV, prochlorperazine, QUEtiapine   SUBJECTIVE: He is still tender along the course of his VP shunt over his chest and upper abdomen.  Review of Systems: Review of Systems  Unable to perform ROS: Mental acuity    Allergies  Allergen Reactions  . Acyclovir And Related     OBJECTIVE: Vitals:   01/01/18 0344 01/01/18 0711 01/01/18 0800 01/01/18 0900  BP:   120/81 123/76  Pulse:   (!) 118 (!)  127  Resp:   (!) 27 (!) 27  Temp: (!) 100.9 F (38.3 C) 100.1 F (37.8 C)    TempSrc: Axillary Oral    SpO2:   97% 95%  Weight:      Height:       Body mass index is 22.92 kg/m.  Physical Exam  Constitutional:  He is resting quietly in bed.  He is uncomfortable due to his NG tube.  Cardiovascular: Normal rate and regular rhythm.  No murmur heard. Pulmonary/Chest: Effort normal and breath sounds normal.  Tender along the course of his VP shunt from just above his clavicle to his upper abdomen.  Abdominal: Soft. He exhibits no distension. There is no tenderness.  Neurological: He is alert.    Lab Results Lab Results  Component Value Date   WBC 42.7 (H) 01/01/2018   HGB 11.2 (L) 01/01/2018   HCT 32.9 (L) 01/01/2018   MCV 94.8 01/01/2018   PLT 375 01/01/2018    Lab Results  Component Value Date   CREATININE 1.02 01/01/2018   BUN 5 (L) 01/01/2018   NA 136 01/01/2018   K 3.2 (L) 01/01/2018   CL 104 01/01/2018   CO2 22 01/01/2018    Lab Results  Component Value Date   ALT 35 12/30/2017   AST 44 (H) 12/30/2017   ALKPHOS 96 12/30/2017   BILITOT 1.8 (H) 12/30/2017     Microbiology:  Recent Results (from the past 240 hour(s))  Culture, blood (Routine x 2)     Status: None (Preliminary result)   Collection Time: 12/30/17  6:30 PM  Result Value Ref Range Status   Specimen Description BLOOD RIGHT ANTECUBITAL  Final   Special Requests   Final    BOTTLES DRAWN AEROBIC AND ANAEROBIC Blood Culture adequate volume   Culture  Setup Time   Final    GRAM POSITIVE COCCI ANAEROBIC BOTTLE ONLY CRITICAL VALUE NOTED.  VALUE IS CONSISTENT WITH PREVIOUSLY REPORTED AND CALLED VALUE.    Culture PENDING  Incomplete   Report Status PENDING  Incomplete  Culture, blood (Routine x 2)     Status: None (Preliminary result)   Collection Time: 12/30/17  6:40 PM  Result Value Ref Range Status   Specimen Description BLOOD LEFT ANTECUBITAL  Final   Special Requests   Final    BOTTLES DRAWN  AEROBIC AND ANAEROBIC Blood Culture adequate volume   Culture  Setup Time   Final    GRAM POSITIVE COCCI IN CLUSTERS IN BOTH AEROBIC AND ANAEROBIC BOTTLES CRITICAL RESULT CALLED TO, READ BACK BY AND VERIFIED WITH: A. MYER PHARMD, AT 2021 12/31/17 BY D. VANHOOK    Culture GRAM POSITIVE COCCI  Final   Report Status PENDING  Incomplete  Blood Culture ID Panel (Reflexed)     Status: Abnormal   Collection Time: 12/30/17  6:40 PM  Result Value Ref Range Status   Enterococcus species NOT DETECTED NOT DETECTED Final   Listeria monocytogenes NOT DETECTED NOT DETECTED Final   Staphylococcus species DETECTED (A) NOT DETECTED Final    Comment: Methicillin (oxacillin) resistant coagulase negative staphylococcus. Possible blood culture contaminant (unless isolated from more than one blood culture draw or clinical case suggests pathogenicity). No antibiotic treatment is indicated for blood  culture contaminants. CRITICAL RESULT CALLED TO, READ BACK BY AND VERIFIED WITH: A. MYER PHARMD, AT 2021 12/31/17 BY D. VANHOOK    Staphylococcus aureus NOT DETECTED NOT DETECTED Final   Methicillin resistance DETECTED (A) NOT DETECTED Final    Comment: CRITICAL RESULT CALLED TO, READ BACK BY AND VERIFIED WITH: A. MYER PHARMD, AT 2021 12/31/17 BY D. VANHOOK    Streptococcus species NOT DETECTED NOT DETECTED Final   Streptococcus agalactiae NOT DETECTED NOT DETECTED Final   Streptococcus pneumoniae NOT DETECTED NOT DETECTED Final   Streptococcus pyogenes NOT DETECTED NOT DETECTED Final   Acinetobacter baumannii NOT DETECTED NOT DETECTED Final   Enterobacteriaceae species NOT DETECTED NOT DETECTED Final   Enterobacter cloacae complex NOT DETECTED NOT DETECTED Final   Escherichia coli NOT DETECTED NOT DETECTED Final   Klebsiella oxytoca NOT DETECTED NOT DETECTED Final   Klebsiella pneumoniae NOT DETECTED NOT DETECTED Final   Proteus species NOT DETECTED NOT DETECTED Final   Serratia marcescens NOT DETECTED NOT DETECTED  Final   Haemophilus influenzae NOT DETECTED NOT DETECTED Final   Neisseria meningitidis NOT DETECTED NOT DETECTED Final   Pseudomonas aeruginosa NOT DETECTED NOT DETECTED Final   Candida albicans NOT DETECTED NOT DETECTED Final   Candida glabrata NOT DETECTED NOT DETECTED Final   Candida krusei NOT DETECTED NOT DETECTED Final   Candida parapsilosis NOT DETECTED NOT DETECTED Final   Candida tropicalis NOT DETECTED NOT DETECTED Final  MRSA PCR Screening     Status: None   Collection Time: 12/31/17  8:38 AM  Result Value Ref Range Status   MRSA by PCR NEGATIVE NEGATIVE Final    Comment:  The GeneXpert MRSA Assay (FDA approved for NASAL specimens only), is one component of a comprehensive MRSA colonization surveillance program. It is not intended to diagnose MRSA infection nor to guide or monitor treatment for MRSA infections.   Urine Culture     Status: None (Preliminary result)   Collection Time: 12/31/17 10:54 AM  Result Value Ref Range Status   Specimen Description URINE, CLEAN CATCH  Final   Special Requests NONE  Final   Culture CULTURE REINCUBATED FOR BETTER GROWTH  Final   Report Status PENDING  Incomplete    Cliffton Asters, MD [facility] [phone] pager   [phone] cell 01/01/2018, 10:15 AM

## 2018-01-01 NOTE — Anesthesia Procedure Notes (Signed)
Procedure Name: Intubation Date/Time: 01/01/2018 7:27 PM Performed by: Teressa Lower., CRNA Pre-anesthesia Checklist: Patient identified, Emergency Drugs available, Suction available and Patient being monitored Patient Re-evaluated:Patient Re-evaluated prior to induction Oxygen Delivery Method: Circle system utilized Preoxygenation: Pre-oxygenation with 100% oxygen Induction Type: Rapid sequence, IV induction and Cricoid Pressure applied Laryngoscope Size: Mac and 4 Grade View: Grade I Tube type: Oral Tube size: 7.5 mm Number of attempts: 1 Airway Equipment and Method: Stylet and Oral airway Placement Confirmation: ETT inserted through vocal cords under direct vision,  positive ETCO2 and breath sounds checked- equal and bilateral Secured at: 21 cm Tube secured with: Tape Dental Injury: Teeth and Oropharynx as per pre-operative assessment

## 2018-01-01 NOTE — Progress Notes (Signed)
Patient complaining of increased abdominal pain and nausea with vomiting. RN assessment shows increased abdominal distension, firmness, and significant pain (especially upon palpation). RN also noted decreased bowel sounds, especially in RUQ. Provider was notified. Abd xray ordered. Additional orders placed for meds and NG tube. NG tube placed by RN and connected to low wall intermittent suction. RN will continue to monitor.

## 2018-01-01 NOTE — Progress Notes (Signed)
RN called NP asking for more n/v meds given pt's continued nausea with some vomiting even after Zofran. Per RN, bowel sounds are hypoactive and abdomen is more distended. Also, pt having some loose stools. He is s/p VP shunt placement as well. NP ordered stat abdominal film to double check VP shunt as well as bowels. Xray shows ileus vs SBP. Will put NGT down to LWIS and make pt NPO now.  KJKG, NP [facility]

## 2018-01-01 NOTE — Anesthesia Postprocedure Evaluation (Signed)
Anesthesia Post Note  Patient: Justin Ball  Procedure(s) Performed: REMOVAL OF V-P SHUNT, PLACEMENT OF EVD (N/A Head)     Patient location during evaluation: PACU Anesthesia Type: General Level of consciousness: sedated and patient cooperative (pt mental status similar to pre-op, somnolent but appropriate) Pain management: pain level controlled Vital Signs Assessment: post-procedure vital signs reviewed and stable Respiratory status: spontaneous breathing, nonlabored ventilation, respiratory function stable and patient connected to nasal cannula oxygen Cardiovascular status: blood pressure returned to baseline and stable Postop Assessment: no apparent nausea or vomiting Anesthetic complications: no    Last Vitals:  Vitals:   01/01/18 2048 01/01/18 2115  BP: 125/78 123/81  Pulse: (!) 126 (!) 121  Resp: (!) 21 (!) 26  Temp: 37.2 C (!) 38.1 C  SpO2: 95% 94%    Last Pain:  Vitals:   01/01/18 2115  TempSrc: Oral  PainSc:                  Kendelle Schweers,E. Chrystian Cupples

## 2018-01-01 NOTE — Progress Notes (Signed)
I have spoken with the patient's mother and grandmother regarding my recommendation for removal of the shunt and placement of an EVD. Risks of the surgery were reviewed. All questions were answered and they indicated willingness to proceed with surgery.

## 2018-01-01 NOTE — Anesthesia Preprocedure Evaluation (Signed)
Anesthesia Evaluation  Patient identified by MRN, date of birth, ID band Patient awake    Reviewed: Allergy & Precautions, NPO status , Patient's Chart, lab work & pertinent test results  History of Anesthesia Complications Negative for: history of anesthetic complications  Airway Mallampati: II  TM Distance: >3 FB Neck ROM: Full    Dental  (+) Teeth Intact   Pulmonary Current Smoker,    breath sounds clear to auscultation       Cardiovascular negative cardio ROS   Rhythm:Regular Rate:Tachycardia     Neuro/Psych  Headaches, Seizures -, Well Controlled,  PSYCHIATRIC DISORDERS Anxiety Depression Bipolar Disorder Cryptococcus meningitis, VP shunt  Neuromuscular disease negative psych ROS   GI/Hepatic negative GI ROS, (+) Hepatitis -  Endo/Other  negative endocrine ROS  Renal/GU Renal disease     Musculoskeletal negative musculoskeletal ROS (+)   Abdominal   Peds  Hematology  (+) anemia , HIV,   Anesthesia Other Findings   Reproductive/Obstetrics                             Anesthesia Physical  Anesthesia Plan  ASA: III  Anesthesia Plan: General   Post-op Pain Management:    Induction: Intravenous  PONV Risk Score and Plan: 1 and 2 and Ondansetron, Dexamethasone and Treatment may vary due to age or medical condition  Airway Management Planned: Oral ETT  Additional Equipment: None  Intra-op Plan:   Post-operative Plan: Extubation in OR  Informed Consent: I have reviewed the patients History and Physical, chart, labs and discussed the procedure including the risks, benefits and alternatives for the proposed anesthesia with the patient or authorized representative who has indicated his/her understanding and acceptance.   Dental advisory given  Plan Discussed with:   Anesthesia Plan Comments:         Anesthesia Quick Evaluation

## 2018-01-01 NOTE — Progress Notes (Signed)
Advanced pt NG tube 5 cm as ordered per x-ray results.

## 2018-01-02 ENCOUNTER — Telehealth: Payer: Self-pay | Admitting: *Deleted

## 2018-01-02 ENCOUNTER — Encounter (HOSPITAL_COMMUNITY): Payer: Self-pay | Admitting: Neurosurgery

## 2018-01-02 ENCOUNTER — Inpatient Hospital Stay (HOSPITAL_COMMUNITY): Payer: Medicaid Other

## 2018-01-02 LAB — CBC
HEMATOCRIT: 31.3 % — AB (ref 39.0–52.0)
HEMOGLOBIN: 10.4 g/dL — AB (ref 13.0–17.0)
MCH: 31.8 pg (ref 26.0–34.0)
MCHC: 33.2 g/dL (ref 30.0–36.0)
MCV: 95.7 fL (ref 78.0–100.0)
Platelets: 331 10*3/uL (ref 150–400)
RBC: 3.27 MIL/uL — AB (ref 4.22–5.81)
RDW: 14.6 % (ref 11.5–15.5)
WBC: 40.1 10*3/uL — AB (ref 4.0–10.5)

## 2018-01-02 LAB — BASIC METABOLIC PANEL
ANION GAP: 9 (ref 5–15)
BUN: 6 mg/dL (ref 6–20)
CHLORIDE: 107 mmol/L (ref 101–111)
CO2: 24 mmol/L (ref 22–32)
CREATININE: 0.86 mg/dL (ref 0.61–1.24)
Calcium: 8.3 mg/dL — ABNORMAL LOW (ref 8.9–10.3)
GFR calc non Af Amer: >60 mL/min (ref >60)
Glucose, Bld: 104 mg/dL — ABNORMAL HIGH (ref 65–99)
POTASSIUM: 3.4 mmol/L — AB (ref 3.5–5.1)
SODIUM: 140 mmol/L (ref 135–145)

## 2018-01-02 LAB — HIV-1 RNA QUANT-NO REFLEX-BLD: LOG10 HIV-1 RNA: UNDETERMINED {Log_copies}/mL

## 2018-01-02 LAB — URINE CULTURE

## 2018-01-02 LAB — T-HELPER CELLS (CD4) COUNT (NOT AT ARMC)
CD4 % Helper T Cell: 9 % — ABNORMAL LOW (ref 33–55)
CD4 T Cell Abs: 400 /uL (ref 400–2700)

## 2018-01-02 LAB — VANCOMYCIN, TROUGH: Vancomycin Tr: 13 ug/mL — ABNORMAL LOW (ref 15–20)

## 2018-01-02 MED ORDER — PROPOFOL 1000 MG/100ML IV EMUL
INTRAVENOUS | Status: AC
  Filled 2018-01-02: qty 100

## 2018-01-02 MED ORDER — WHITE PETROLATUM EX OINT
TOPICAL_OINTMENT | CUTANEOUS | Status: AC
  Administered 2018-01-02: 1
  Filled 2018-01-02: qty 28.35

## 2018-01-02 MED ORDER — ORAL CARE MOUTH RINSE
15.0000 mL | Freq: Two times a day (BID) | OROMUCOSAL | Status: DC
  Administered 2018-01-02 – 2018-01-08 (×11): 15 mL via OROMUCOSAL

## 2018-01-02 MED ORDER — FLUCONAZOLE IN SODIUM CHLORIDE 200-0.9 MG/100ML-% IV SOLN
200.0000 mg | INTRAVENOUS | Status: DC
  Administered 2018-01-03: 200 mg via INTRAVENOUS
  Filled 2018-01-02 (×2): qty 100

## 2018-01-02 MED ORDER — VANCOMYCIN HCL 10 G IV SOLR
1250.0000 mg | Freq: Three times a day (TID) | INTRAVENOUS | Status: DC
  Administered 2018-01-02 – 2018-01-04 (×7): 1250 mg via INTRAVENOUS
  Filled 2018-01-02 (×8): qty 1250

## 2018-01-02 MED ORDER — HYDROCORTISONE 1 % EX CREA
TOPICAL_CREAM | CUTANEOUS | Status: DC | PRN
  Administered 2018-01-07: 01:00:00 via TOPICAL
  Filled 2018-01-02: qty 28

## 2018-01-02 MED FILL — Thrombin (Recombinant) For Soln 5000 Unit: CUTANEOUS | Qty: 5000 | Status: AC

## 2018-01-02 NOTE — Progress Notes (Signed)
1 Day Post-Op   Subjective/Chief Complaint: Abdomen is much less tender this morning after removal of shunt Hungry Recorded BM yesterday  Objective: Vital signs in last 24 hours: Temp:  [97.9 F (36.6 C)-102.8 F (39.3 C)] 97.9 F (36.6 C) (01/09 0400) Pulse Rate:  [27-130] 108 (01/09 0700) Resp:  [13-30] 15 (01/09 0700) BP: (97-136)/(52-92) 117/87 (01/09 0700) SpO2:  [87 %-97 %] 97 % (01/09 0700) Last BM Date: 01/01/18  Intake/Output from previous day: 01/08 0701 - 01/09 0700 In: 3350 [I.V.:2840; IV Piggyback:510] Out: 3132 [Urine:2700; Emesis/NG output:400; Drains:17; Blood:15] Intake/Output this shift: No intake/output data recorded.  General appearance: alert, cooperative and no distress GI: distended, minimal bowel sounds, much less tender  Lab Results:  Recent Labs    12/31/17 0439 01/01/18 0842  WBC 29.5* 42.7*  HGB 12.3* 11.2*  HCT 35.7* 32.9*  PLT 360 375   BMET Recent Labs    12/31/17 0439 01/01/18 0842  NA 135 136  Ball 2.8* 3.2*  CL 105 104  CO2 20* 22  GLUCOSE 98 117*  BUN 7 5*  CREATININE 1.21 1.02  CALCIUM 8.1* 8.5*   PT/INR Recent Labs    12/30/17 1830  LABPROT 14.5  INR 1.14   ABG No results for input(s): PHART, HCO3 in the last 72 hours.  Invalid input(s): PCO2, PO2  Studies/Results: Dg Abd Portable 1v  Result Date: 01/01/2018 CLINICAL DATA:  NG tube placement. EXAM: PORTABLE ABDOMEN - 1 VIEW COMPARISON:  Radiographs earlier this day. FINDINGS: Tip of the enteric tube in the stomach, side-port in the region of the distal esophagus. Decreased gaseous gastric distention from prior. Persistent small bowel dilatation. IMPRESSION: Tip of the enteric tube in the stomach, side-port in the region of the distal esophagus. Recommend advancement of at least 5 cm for optimal placement. Decreased gaseous gastric distension with persistent small bowel dilatation. Electronically Signed   By: Rubye Oaks M.D.   On: 01/01/2018 03:41   Dg Abd  Portable 1v  Result Date: 01/01/2018 CLINICAL DATA:  Nausea and vomiting.  Status post VP shunt. EXAM: PORTABLE ABDOMEN - 1 VIEW COMPARISON:  Abdominal CT 12/30/2017 FINDINGS: New gaseous gastric distension. New small bowel dilatation up to 4.2 cm. No evidence of free air. Ventriculoperitoneal shunt catheter tubing in the right abdomen terminating in the pelvis. IMPRESSION: New gaseous gastric distention and small bowel dilatation, ileus versus early small bowel obstruction. Electronically Signed   By: Rubye Oaks M.D.   On: 01/01/2018 00:42    Anti-infectives: Anti-infectives (From admission, onward)   Start     Dose/Rate Route Frequency Ordered Stop   01/02/18 0900  sulfamethoxazole-trimethoprim (BACTRIM DS,SEPTRA DS) 800-160 MG per tablet 1 tablet     1 tablet Oral Once per day on Mon Wed Fri 12/31/17 1003     01/01/18 0930  fluconazole (DIFLUCAN) IVPB 400 mg     400 mg 100 mL/hr over 120 Minutes Intravenous Every 24 hours 01/01/18 0830     12/31/17 2200  vancomycin (VANCOCIN) IVPB 1000 mg/200 mL premix     1,000 mg 200 mL/hr over 60 Minutes Intravenous Every 8 hours 12/31/17 2048     12/31/17 1400  cefTAZidime (FORTAZ) 2 g in dextrose 5 % 50 mL IVPB  Status:  Discontinued     2 g 100 mL/hr over 30 Minutes Intravenous Every 8 hours 12/31/17 1119 01/01/18 0808   12/31/17 1200  dolutegravir (TIVICAY) tablet 50 mg     50 mg Oral Daily 12/31/17 0207  12/31/17 1200  emtricitabine-rilpivir-tenofovir AF (ODEFSEY) 200-25-25 MG per tablet 1 tablet     1 tablet Oral Daily with breakfast 12/31/17 0207     12/31/17 1100  fluconazole (DIFLUCAN) tablet 400 mg  Status:  Discontinued     400 mg Oral Daily 12/31/17 1003 01/01/18 0829   12/31/17 1000  vancomycin (VANCOCIN) IVPB 1000 mg/200 mL premix  Status:  Discontinued     1,000 mg 200 mL/hr over 60 Minutes Intravenous 2 times daily 12/31/17 0444 12/31/17 2048   12/31/17 0215  sulfamethoxazole-trimethoprim (BACTRIM DS,SEPTRA DS) 800-160 MG per  tablet 1 tablet  Status:  Discontinued     1 tablet Oral 2 times daily 12/31/17 0207 12/31/17 1003   12/31/17 0215  cefTRIAXone (ROCEPHIN) 2 g in dextrose 5 % 50 mL IVPB  Status:  Discontinued     2 g 100 mL/hr over 30 Minutes Intravenous Every 24 hours 12/31/17 0207 12/31/17 1119   12/30/17 2345  vancomycin (VANCOCIN) 1,500 mg in sodium chloride 0.9 % 500 mL IVPB     1,500 mg 250 mL/hr over 120 Minutes Intravenous  Once 12/30/17 2331 12/31/17 0324   12/30/17 2330  cefTRIAXone (ROCEPHIN) 2 g in dextrose 5 % 50 mL IVPB     2 g 100 mL/hr over 30 Minutes Intravenous  Once 12/30/17 2328 12/31/17 0122      Assessment/Plan: Ileus secondary to infected VP shunt - s/p removal of VPS Still with minimal bowel sounds May have ice chips and take PO meds with sips of water/ clamp NG 1 hour  Antibiotics per ID -   LOS: 2 days    Justin LunaMatthew Ball Justin Ball 01/02/2018

## 2018-01-02 NOTE — Progress Notes (Signed)
Patient ID: Justin Ball, male   DOB: [birth year], 27 y.o.   MRN: [id number]          [facility]  Date of Admission:  12/30/2017           Day 3 vancomycin         ASSESSMENT: He has coagulase-negative staph bacteremia and VP shunt infection.  He is improving with vancomycin and shunt removal.  His CSF was clear and Gram stain showed no organisms.  Cultures are pending.  PLAN: 1. Continue vancomycin 2. 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 dolutegravir  50 mg Oral Daily  . emtricitabine-rilpivir-tenofovir AF  1 tablet Oral Q breakfast  . mouth rinse  15 mL Mouth Rinse BID  . sulfamethoxazole-trimethoprim  1 tablet Oral Once per day on Mon Wed Fri   Continuous Infusions: . sodium chloride 100 mL/hr at 01/02/18 0623  . fluconazole (DIFLUCAN) IV 400 mg (01/02/18 0944)  . lactated ringers 10 mL/hr at 01/01/18 1754  . levETIRAcetam Stopped (01/02/18 0934)  . vancomycin 1,250 mg (01/02/18 1101)   PRN Meds:.acetaminophen, acetaminophen, hydrocortisone cream, morphine injection, ondansetron **OR** ondansetron (ZOFRAN) IV, prochlorperazine, QUEtiapine   SUBJECTIVE: He is feeling better after removal of his infected VP shunt yesterday.  Review of Systems: Review of Systems  Unable to perform ROS: Mental acuity    Allergies  Allergen Reactions  . Acyclovir And Related     OBJECTIVE: Vitals:   01/02/18 0700 01/02/18 0800 01/02/18 0900 01/02/18 1100  BP: 117/87 122/86 118/86 120/82  Pulse: (!) 108 92 99 88  Resp: 15 16 17 19   Temp:  97.8 F (36.6 C)    TempSrc:  Oral    SpO2: 97%  98% 96% 98%  Weight:      Height:       Body mass index is 22.92 kg/m.  Physical Exam  Constitutional:  He now has a new external ventricular drain.   Cardiovascular: Normal rate and regular rhythm.  No murmur heard. Pulmonary/Chest: Effort normal and breath sounds normal.  He still has some mild erythema and tenderness along the previous shunt tract over his right anterior chest but this has improved.  Abdominal: Soft. He exhibits no distension. There is no tenderness.  Neurological: He is alert.    Lab Results Lab Results  Component Value Date   WBC 40.1 (H) 01/02/2018   HGB 10.4 (L) 01/02/2018   HCT 31.3 (L) 01/02/2018   MCV 95.7 01/02/2018   PLT 331 01/02/2018    Lab Results  Component Value Date   CREATININE 0.86 01/02/2018   BUN 6 01/02/2018   NA 140 01/02/2018   K 3.4 (L) 01/02/2018   CL 107 01/02/2018   CO2 24 01/02/2018    Lab Results  Component Value Date   ALT 35 12/30/2017   AST 44 (H) 12/30/2017   ALKPHOS 96 12/30/2017   BILITOT 1.8 (H) 12/30/2017     Microbiology: Recent Results (from the past 240 hour(s))  Culture, blood (Routine x 2)     Status: Abnormal (Preliminary result)   Collection Time:  12/30/17  6:30 PM  Result Value Ref Range Status   Specimen Description BLOOD RIGHT ANTECUBITAL  Final   Special Requests   Final    BOTTLES DRAWN AEROBIC AND ANAEROBIC Blood Culture adequate volume   Culture  Setup Time   Final    GRAM POSITIVE COCCI ANAEROBIC BOTTLE ONLY CRITICAL VALUE NOTED.  VALUE IS CONSISTENT WITH PREVIOUSLY REPORTED AND CALLED VALUE.    Culture (A)  Final    STAPHYLOCOCCUS SPECIES (COAGULASE NEGATIVE) SUSCEPTIBILITIES TO FOLLOW    Report Status PENDING  Incomplete  Culture, blood (Routine x 2)     Status: Abnormal (Preliminary result)   Collection Time: 12/30/17  6:40 PM  Result Value Ref Range Status   Specimen Description BLOOD LEFT ANTECUBITAL  Final   Special Requests   Final    BOTTLES DRAWN AEROBIC AND ANAEROBIC  Blood Culture adequate volume   Culture  Setup Time   Final    GRAM POSITIVE COCCI IN CLUSTERS IN BOTH AEROBIC AND ANAEROBIC BOTTLES CRITICAL RESULT CALLED TO, READ BACK BY AND VERIFIED WITH: A. MYER PHARMD, AT 2021 12/31/17 BY D. VANHOOK    Culture STAPHYLOCOCCUS SPECIES (COAGULASE NEGATIVE) (A)  Final   Report Status PENDING  Incomplete  Blood Culture ID Panel (Reflexed)     Status: Abnormal   Collection Time: 12/30/17  6:40 PM  Result Value Ref Range Status   Enterococcus species NOT DETECTED NOT DETECTED Final   Listeria monocytogenes NOT DETECTED NOT DETECTED Final   Staphylococcus species DETECTED (A) NOT DETECTED Final    Comment: Methicillin (oxacillin) resistant coagulase negative staphylococcus. Possible blood culture contaminant (unless isolated from more than one blood culture draw or clinical case suggests pathogenicity). No antibiotic treatment is indicated for blood  culture contaminants. CRITICAL RESULT CALLED TO, READ BACK BY AND VERIFIED WITH: A. MYER PHARMD, AT 2021 12/31/17 BY D. VANHOOK    Staphylococcus aureus NOT DETECTED NOT DETECTED Final   Methicillin resistance DETECTED (A) NOT DETECTED Final    Comment: CRITICAL RESULT CALLED TO, READ BACK BY AND VERIFIED WITH: A. MYER PHARMD, AT 2021 12/31/17 BY D. VANHOOK    Streptococcus species NOT DETECTED NOT DETECTED Final   Streptococcus agalactiae NOT DETECTED NOT DETECTED Final   Streptococcus pneumoniae NOT DETECTED NOT DETECTED Final   Streptococcus pyogenes NOT DETECTED NOT DETECTED Final   Acinetobacter baumannii NOT DETECTED NOT DETECTED Final   Enterobacteriaceae species NOT DETECTED NOT DETECTED Final   Enterobacter cloacae complex NOT DETECTED NOT DETECTED Final   Escherichia coli NOT DETECTED NOT DETECTED Final   Klebsiella oxytoca NOT DETECTED NOT DETECTED Final   Klebsiella pneumoniae NOT DETECTED NOT DETECTED Final   Proteus species NOT DETECTED NOT DETECTED Final   Serratia marcescens NOT DETECTED NOT  DETECTED Final   Haemophilus influenzae NOT DETECTED NOT DETECTED Final   Neisseria meningitidis NOT DETECTED NOT DETECTED Final   Pseudomonas aeruginosa NOT DETECTED NOT DETECTED Final   Candida albicans NOT DETECTED NOT DETECTED Final   Candida glabrata NOT DETECTED NOT DETECTED Final   Candida krusei NOT DETECTED NOT DETECTED Final   Candida parapsilosis NOT DETECTED NOT DETECTED Final   Candida tropicalis NOT DETECTED NOT DETECTED Final  MRSA PCR Screening     Status: None   Collection Time: 12/31/17  8:38 AM  Result Value Ref Range Status   MRSA by PCR NEGATIVE NEGATIVE Final    Comment:        The GeneXpert MRSA Assay (FDA approved for NASAL specimens only), is  one component of a comprehensive MRSA colonization surveillance program. It is not intended to diagnose MRSA infection nor to guide or monitor treatment for MRSA infections.   Urine Culture     Status: Abnormal   Collection Time: 12/31/17 10:54 AM  Result Value Ref Range Status   Specimen Description URINE, CLEAN CATCH  Final   Special Requests NONE  Final   Culture 40,000 COLONIES/mL ENTEROCOCCUS FAECALIS (A)  Final   Report Status 01/02/2018 FINAL  Final   Organism ID, Bacteria ENTEROCOCCUS FAECALIS (A)  Final      Susceptibility   Enterococcus faecalis - MIC*    AMPICILLIN <=2 SENSITIVE Sensitive     LEVOFLOXACIN 1 SENSITIVE Sensitive     NITROFURANTOIN <=16 SENSITIVE Sensitive     VANCOMYCIN 1 SENSITIVE Sensitive     * 40,000 COLONIES/mL ENTEROCOCCUS FAECALIS  Surgical pcr screen     Status: None   Collection Time: 01/01/18  2:08 PM  Result Value Ref Range Status   MRSA, PCR NEGATIVE NEGATIVE Final   Staphylococcus aureus NEGATIVE NEGATIVE Final    Comment: (NOTE) The Xpert SA Assay (FDA approved for NASAL specimens in patients 27 years of age and older), is one component of a comprehensive surveillance program. It is not intended to diagnose infection nor to guide or monitor treatment.   CSF culture      Status: None (Preliminary result)   Collection Time: 01/01/18  7:37 PM  Result Value Ref Range Status   Specimen Description CSF  Final   Special Requests SHUNT  Final   Gram Stain   Final    WBC PRESENT, PREDOMINANTLY MONONUCLEAR NO ORGANISMS SEEN CYTOSPIN SMEAR    Culture PENDING  Incomplete   Report Status PENDING  Incomplete  Aerobic/Anaerobic Culture (surgical/deep wound)     Status: None (Preliminary result)   Collection Time: 01/01/18  7:39 PM  Result Value Ref Range Status   Specimen Description WOUND  Final   Special Requests VP SHUNT FROM BRAIN  Final   Gram Stain   Final    ABUNDANT WBC PRESENT,BOTH PMN AND MONONUCLEAR NO ORGANISMS SEEN    Culture PENDING  Incomplete   Report Status PENDING  Incomplete    Cliffton AstersJohn Quiera Diffee, MD [facility] [phone] pager   [phone] cell 01/02/2018, 11:42 AM

## 2018-01-02 NOTE — Progress Notes (Signed)
     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 After diagnosis, Justin Ball received 4 weeks of Amphotericin B and Flucytosine from 05/20/17 to 06/19/17. He was then switched to Fluconazole 400 mg every 24 hours at the end of June. Per pharmacy records, he only received 1 fill of the Fluconazole 400 mg in July before being seen in the [facility] clinic on 09/13/17, meaning that he missed 1-2 months of consolidation therapy. At that time it was decided to keep Justin Ball on fluconazole 400 mg due to his missed doses. He has since picked up fills of the fluconazole 400 mg in September, November and December. Given this information, Justin Ball has likely had 8 full weeks of fluconazole 400 mg and it would be appropriate to transition him to fluconazole 200 mg for maintenance therapy.     Della GooEmily S Sinclair, PharmD, BCPS PGY2 Infectious Diseases Pharmacy Resident [phone] pager   01/02/2018, 11:09 AM

## 2018-01-02 NOTE — Progress Notes (Signed)
Pharmacy Antibiotic Note  Justin Ball is a 27 y.o. male admitted on 12/30/2017. Pharmacy has been consulted for vancomycin dosing for abdominal wall cellulitis + VP shunt infection. Pt with Tmax 102.8 and WBC is elevated at 42.7. VPS removed and EVD placed 1.8. A vancomycin trough today is 13. Will target goal trough of 15-20 for CNS coverage for now.   Plan: Change vancomycin 1250mg  IV Q8H F/u renal fxn, C&S, clinical status and trough at SS  Height: 6\' 1"  (185.4 cm) Weight: 173 lb 11.6 oz (78.8 kg) IBW/kg (Calculated) : 79.9  Temp (24hrs), Avg:99.8 F (37.7 C), Min:97.8 F (36.6 C), Max:102.8 F (39.3 C)  Recent Labs  Lab 12/30/17 1830 12/30/17 1848 12/30/17 2042 12/31/17 0439 01/01/18 0842 01/02/18 0748  WBC 27.7*  --   --  29.5* 42.7* 40.1*  CREATININE 1.33*  --   --  1.21 1.02 0.86  LATICACIDVEN  --  1.54 1.72  --   --   --   VANCOTROUGH  --   --   --   --   --  13*    Estimated Creatinine Clearance: 145.1 mL/min (by C-G formula based on SCr of 0.86 mg/dL).    Allergies  Allergen Reactions  . Acyclovir And Related     Antimicrobials this admission: Bactrim PTA >> Vanc 1/7 >> CTX 1/7 >> 1/7 Ceftaz 1/7 >> 1/8  Dose adjustments this admission: 1/9 VT = 13 on 1gm Q8H>>changed to 1250mg  IV Q8H  Microbiology results: 1/6 BCx >> 1/2 CoNS 1/7 UCx >> 40K enterococcus 1/7 MRSA PCR >> negative 1/8 CSF - pending  Thank you for allowing pharmacy to be a part of this patient's care.  Justin Ball, Justin Ball 01/02/2018 10:27 AM

## 2018-01-02 NOTE — Progress Notes (Signed)
PROGRESS NOTE    Justin Ball  ZOX:[id number] DOB: 05/04/1991 DOA: 12/30/2017 PCP: Randall Hiss, MD    Brief Narrative:  Justin Ball is a 27 y.o. 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  He was placed on bactrim which he has taken 3 doses.  The rash has gotten worse and is tracking up along where his shunt is under his skin along his chest wall.  Pt came in for fever and chills.  Denies any urinary symptoms.  No cough.  No sob.  Pt referred for admission for cellulitis associated with his vp shunt.  Both ID and neurosurg called and will both see in the am.  Patient admitted with sepsis, and VP shunt infection. He is on IV antibiotics. He underwent VP shunt removal and placement of EVD on 1-08 by neurosurgery. He also develops ileus secondary to VP shunt infection. General surgery has been following patient.    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  Presents with fever, leukocytosis, tachycardia, tachypnea.  Neurosurgery consulted. Patient underwent  VP shunt removal, placement of EVD on 1-08 Continue with IV fluids.  Follow urine culture grew enterococcus. , blood culture. Growing staph, methicillin resistant. Ceftazidime discontinue.  Continue with Vancomycin. ID helping with IV antibiotics.  Labs pending for the morning.   2-Abdominal Pain; Nausea, vomiting; Ileus.  KUB with sign of SBO vs ileus.  Concern with  peritonitis, general sx was consulted. Continue with IV antibiotics.  General surgery following.  morphine to Q 2 Hr PRN.   AIDS: Continue with [place], [place].  ID consulted. On Fluconazole for history of cryptococcus Meningitis.   Seizure: Continue with Keppra. Change to IV due to NPO.   Hypokalemia; labs pending.  He received IV supplements.   S/P VP Shunt. Neurosurgery has been consulted due to infection.  He had VP shunt for cryptococcus meningitis.   Chronic viral hepatitis. Follow by ID.   Bipolar; on Seroquel.     DVT prophylaxis: SCD Code Status: Full code.  Family Communication: mother at bedside 1-07.  Disposition Plan: remain inpatient.     Consultants:   Neurosurgery   ID   Procedures:   none   Antimicrobials:   Vancomycin 1-6  Ceftriaxone 1-06--107---Ceftazidime 1-07---1-08  Bactrim 1-06. PCP prophylaxis.    Subjective: He wants to eat. He report improvement of abdominal pain. He has not pass gas/    Objective: Vitals:   01/02/18 0500 01/02/18 0600 01/02/18 0700 01/02/18 0800  BP: 122/86 (!) 121/92 117/87 122/86  Pulse: (!) 101 (!) 112 (!) 108 92  Resp: 14 (!) 26 15 16   Temp:      TempSrc:      SpO2: 96% 96% 97% 98%  Weight:      Height:        Intake/Output Summary (Last 24 hours) at 01/02/2018 0833 Last data filed at 01/02/2018 0800 Gross per 24 hour  Intake 3350 ml  Output 3683 ml  Net -333 ml   Filed Weights   12/30/17 1808 12/31/17 0758  Weight: 81.6 kg (180 lb) 78.8 kg (173 lb 11.6 oz)    Examination:  General exam: Alert in no distress.  HEENT; head with EVD in place.  Respiratory system: CTA Cardiovascular system: S 1, S 2 RRR Gastrointestinal system: BS hypoactive, less tenderness, no rigidity.   Central nervous system: Alert and oriented, speech clear, moves all 4 extremities.  Extremities:Symmetric power.  Skin: abdominal wall and chest redness decreased.     Data Reviewed: I have personally reviewed  following labs and imaging studies  CBC: Recent Labs  Lab 12/30/17 1830 12/31/17 0439 01/01/18 0842  WBC 27.7* 29.5* 42.7*  NEUTROABS 23.9*  --   --   HGB 12.4* 12.3* 11.2*  HCT 37.1* 35.7* 32.9*  MCV 95.1 94.7 94.8  PLT 403* 360 375   Basic Metabolic Panel: Recent Labs  Lab 12/30/17 1830 12/31/17 0439 01/01/18 0842  NA 132* 135 136  K 3.1* 2.8* 3.2*  CL 98* 105 104  CO2 23 20* 22  GLUCOSE 116* 98 117*  BUN 6 7 5*  CREATININE 1.33* 1.21 1.02  CALCIUM 8.9 8.1* 8.5*   GFR: Estimated Creatinine Clearance: 122.3 mL/min (by C-G formula based on SCr of 1.02 mg/dL). Liver Function Tests: Recent Labs  Lab 12/30/17 1830  AST 44*  ALT 35  ALKPHOS 96  BILITOT 1.8*  PROT 9.0*  ALBUMIN 3.1*   No results for input(s): LIPASE, AMYLASE in the last 168 hours. No results for input(s): AMMONIA in the last 168 hours. Coagulation Profile: Recent Labs  Lab 12/30/17 1830  INR 1.14   Cardiac Enzymes: No results for input(s): CKTOTAL, CKMB, CKMBINDEX, TROPONINI in the last 168 hours. BNP (last 3 results) No results for input(s): PROBNP in the last 8760 hours. HbA1C: No results for input(s): HGBA1C in the last 72 hours. CBG: No results for input(s): GLUCAP in the last 168 hours. Lipid Profile: No results for input(s): CHOL, HDL, LDLCALC, TRIG, CHOLHDL, LDLDIRECT in the last 72 hours. Thyroid Function Tests: No results for input(s): TSH, T4TOTAL, FREET4, T3FREE, THYROIDAB in the last 72 hours. Anemia Panel: No results for input(s): VITAMINB12, FOLATE, FERRITIN, TIBC, IRON, RETICCTPCT in the last 72 hours. Sepsis Labs: Recent Labs  Lab 12/30/17 1848 12/30/17 2042  LATICACIDVEN 1.54 1.72    Recent Results (from the past 240 hour(s))  Culture, blood (Routine x 2)     Status: None (Preliminary result)   Collection Time: 12/30/17  6:30 PM  Result Value Ref Range Status   Specimen Description BLOOD RIGHT ANTECUBITAL  Final   Special Requests   Final    BOTTLES DRAWN  AEROBIC AND ANAEROBIC Blood Culture adequate volume   Culture  Setup Time   Final    GRAM POSITIVE COCCI ANAEROBIC BOTTLE ONLY CRITICAL VALUE NOTED.  VALUE IS CONSISTENT WITH PREVIOUSLY REPORTED AND CALLED VALUE.    Culture GRAM POSITIVE COCCI  Final   Report Status PENDING  Incomplete  Culture, blood (Routine x 2)     Status: Abnormal (Preliminary result)   Collection Time: 12/30/17  6:40 PM  Result Value Ref Range Status   Specimen Description BLOOD LEFT ANTECUBITAL  Final   Special Requests   Final    BOTTLES DRAWN AEROBIC AND ANAEROBIC Blood Culture adequate volume   Culture  Setup Time   Final    GRAM POSITIVE COCCI IN CLUSTERS IN BOTH AEROBIC AND ANAEROBIC  BOTTLES CRITICAL RESULT CALLED TO, READ BACK BY AND VERIFIED WITH: A. MYER PHARMD, AT 2021 12/31/17 BY D. VANHOOK    Culture STAPHYLOCOCCUS SPECIES (COAGULASE NEGATIVE) (A)  Final   Report Status PENDING  Incomplete  Blood Culture ID Panel (Reflexed)     Status: Abnormal   Collection Time: 12/30/17  6:40 PM  Result Value Ref Range Status   Enterococcus species NOT DETECTED NOT DETECTED Final   Listeria monocytogenes NOT DETECTED NOT DETECTED Final   Staphylococcus species DETECTED (A) NOT DETECTED Final    Comment: Methicillin (oxacillin) resistant coagulase negative staphylococcus. Possible blood culture contaminant (unless isolated from more than one blood culture draw or clinical case suggests pathogenicity). No antibiotic treatment is indicated for blood  culture contaminants. CRITICAL RESULT CALLED TO, READ BACK BY AND VERIFIED WITH: A. MYER PHARMD, AT 2021 12/31/17 BY D. VANHOOK    Staphylococcus aureus NOT DETECTED NOT DETECTED Final   Methicillin resistance DETECTED (A) NOT DETECTED Final    Comment: CRITICAL RESULT CALLED TO, READ BACK BY AND VERIFIED WITH: A. MYER PHARMD, AT 2021 12/31/17 BY D. VANHOOK    Streptococcus species NOT DETECTED NOT DETECTED Final   Streptococcus agalactiae NOT DETECTED NOT DETECTED Final     Streptococcus pneumoniae NOT DETECTED NOT DETECTED Final   Streptococcus pyogenes NOT DETECTED NOT DETECTED Final   Acinetobacter baumannii NOT DETECTED NOT DETECTED Final   Enterobacteriaceae species NOT DETECTED NOT DETECTED Final   Enterobacter cloacae complex NOT DETECTED NOT DETECTED Final   Escherichia coli NOT DETECTED NOT DETECTED Final   Klebsiella oxytoca NOT DETECTED NOT DETECTED Final   Klebsiella pneumoniae NOT DETECTED NOT DETECTED Final   Proteus species NOT DETECTED NOT DETECTED Final   Serratia marcescens NOT DETECTED NOT DETECTED Final   Haemophilus influenzae NOT DETECTED NOT DETECTED Final   Neisseria meningitidis NOT DETECTED NOT DETECTED Final   Pseudomonas aeruginosa NOT DETECTED NOT DETECTED Final   Candida albicans NOT DETECTED NOT DETECTED Final   Candida glabrata NOT DETECTED NOT DETECTED Final   Candida krusei NOT DETECTED NOT DETECTED Final   Candida parapsilosis NOT DETECTED NOT DETECTED Final   Candida tropicalis NOT DETECTED NOT DETECTED Final  MRSA PCR Screening     Status: None   Collection Time: 12/31/17  8:38 AM  Result Value Ref Range Status   MRSA by PCR NEGATIVE NEGATIVE Final    Comment:        The GeneXpert MRSA Assay (FDA approved for NASAL specimens only), is one component of a comprehensive MRSA colonization surveillance program. It is not intended to diagnose MRSA infection nor to guide or monitor treatment for MRSA infections.   Urine Culture     Status: Abnormal   Collection Time: 12/31/17 10:54 AM  Result Value Ref Range Status   Specimen Description URINE, CLEAN CATCH  Final   Special Requests NONE  Final   Culture 40,000 COLONIES/mL ENTEROCOCCUS FAECALIS (A)  Final   Report Status 01/02/2018 FINAL  Final   Organism ID, Bacteria ENTEROCOCCUS FAECALIS (A)  Final      Susceptibility   Enterococcus faecalis - MIC*    AMPICILLIN <=2 SENSITIVE Sensitive     LEVOFLOXACIN 1 SENSITIVE Sensitive     NITROFURANTOIN <=16 SENSITIVE  Sensitive     VANCOMYCIN 1 SENSITIVE Sensitive     * 40,000 COLONIES/mL ENTEROCOCCUS FAECALIS  Surgical pcr screen     Status: None   Collection Time: 01/01/18  2:08 PM  Result Value Ref Range Status  MRSA, PCR NEGATIVE NEGATIVE Final   Staphylococcus aureus NEGATIVE NEGATIVE Final    Comment: (NOTE) The Xpert SA Assay (FDA approved for NASAL specimens in patients 16 years of age and older), is one component of a comprehensive surveillance program. It is not intended to diagnose infection nor to guide or monitor treatment.   CSF culture     Status: None (Preliminary result)   Collection Time: 01/01/18  7:37 PM  Result Value Ref Range Status   Specimen Description CSF  Final   Special Requests SHUNT  Final   Gram Stain   Final    WBC PRESENT, PREDOMINANTLY MONONUCLEAR NO ORGANISMS SEEN CYTOSPIN SMEAR    Culture PENDING  Incomplete   Report Status PENDING  Incomplete  Aerobic/Anaerobic Culture (surgical/deep wound)     Status: None (Preliminary result)   Collection Time: 01/01/18  7:39 PM  Result Value Ref Range Status   Specimen Description WOUND  Final   Special Requests VP SHUNT FROM BRAIN  Final   Gram Stain   Final    ABUNDANT WBC PRESENT,BOTH PMN AND MONONUCLEAR NO ORGANISMS SEEN    Culture PENDING  Incomplete   Report Status PENDING  Incomplete         Radiology Studies: Dg Abd Portable 1v  Result Date: 01/02/2018 CLINICAL DATA:  Ileus EXAM: PORTABLE ABDOMEN - 1 VIEW COMPARISON:  January 01, 2018 FINDINGS: Nasogastric tube tip and side port are in the distal stomach region. There remain loops of dilated small bowel. Air is seen in colon and rectum. There is moderate stool in the colon. No air-fluid levels. No free air evident. IMPRESSION: Nasogastric tube tip and side port in distal stomach. Multiple loops of dilated small bowel. Suspect ileus or enteritis as most likely etiologies. A degree of partial small bowel obstruction cannot be entirely excluded. No free air  evident. Electronically Signed   By: Bretta Bang III M.D.   On: 01/02/2018 08:21   Dg Abd Portable 1v  Result Date: 01/01/2018 CLINICAL DATA:  NG tube placement. EXAM: PORTABLE ABDOMEN - 1 VIEW COMPARISON:  Radiographs earlier this day. FINDINGS: Tip of the enteric tube in the stomach, side-port in the region of the distal esophagus. Decreased gaseous gastric distention from prior. Persistent small bowel dilatation. IMPRESSION: Tip of the enteric tube in the stomach, side-port in the region of the distal esophagus. Recommend advancement of at least 5 cm for optimal placement. Decreased gaseous gastric distension with persistent small bowel dilatation. Electronically Signed   By: Rubye Oaks M.D.   On: 01/01/2018 03:41   Dg Abd Portable 1v  Result Date: 01/01/2018 CLINICAL DATA:  Nausea and vomiting.  Status post VP shunt. EXAM: PORTABLE ABDOMEN - 1 VIEW COMPARISON:  Abdominal CT 12/30/2017 FINDINGS: New gaseous gastric distension. New small bowel dilatation up to 4.2 cm. No evidence of free air. Ventriculoperitoneal shunt catheter tubing in the right abdomen terminating in the pelvis. IMPRESSION: New gaseous gastric distention and small bowel dilatation, ileus versus early small bowel obstruction. Electronically Signed   By: Rubye Oaks M.D.   On: 01/01/2018 00:42        Scheduled Meds: . dolutegravir  50 mg Oral Daily  . emtricitabine-rilpivir-tenofovir AF  1 tablet Oral Q breakfast  . mouth rinse  15 mL Mouth Rinse BID  . sulfamethoxazole-trimethoprim  1 tablet Oral Once per day on Mon Wed Fri   Continuous Infusions: . sodium chloride 100 mL/hr at 01/02/18 0623  . fluconazole (DIFLUCAN) IV Stopped (01/01/18 1217)  .  lactated ringers 10 mL/hr at 01/01/18 1754  . levETIRAcetam Stopped (01/01/18 0945)  . vancomycin Stopped (01/02/18 0115)     LOS: 2 days    Time spent: 35 minutes.     Alba CoryBelkys A Elisabetta Mishra, MD [facility] Pager [phone]  If 7PM-7AM, please  contact night-coverage [email or web address] Password [email or web address] 01/02/2018, 8:33 AM

## 2018-01-02 NOTE — Plan of Care (Signed)
Will continue to assess and manage pain of patient.

## 2018-01-02 NOTE — Telephone Encounter (Addendum)
I only made one visit with the mom/grandmother and [name]. Since our visit in which we discussed his current viral load, Justin Ball has been undetectable(for the last 2 months including yesterday's bloodwork). We arranged for [name] to received PCS services and I would like to know if there is anything else the mom would like for me to work on. Contacted [name]'s mom to offer additional assistance. Message left, hoping to receive a return call but I have to discharge him until I hear from his mom or family   The intent of this communication is to inform the Health Care Team that [facility]  Greater than 3 attempts have been made to re-engage the patient  without any success .Moving forward, [facility] will [facility] willing to [facility] the patient to services if and when the patient is ready to discuss medication adherence and HIV disease  management. Effective 01/02/18 patient will [facility]   I will continue to follow and offer services

## 2018-01-02 NOTE — Progress Notes (Addendum)
No issues overnight.  EXAM:  BP 122/86   Pulse 92   Temp 97.9 F (36.6 C) (Oral)   Resp 16   Ht 6\' 1"  (1.854 m)   Wt 78.8 kg (173 lb 11.6 oz)   SpO2 98%   BMI 22.92 kg/m   Awake, alert, oriented  Speech slow but appropriate CN grossly intact  5/5 BUE/BLE  EVD in place, ~18cc output overnight  LABS: CSF gram stain negative, culture pending  IMPRESSION:  27 y.o. male s/p VPS removal for infection. Distal catheter upon removal was clearly covered in pus. At neurologic baseline currently, has had minimal output from EVD. Raises the questions of continued need for CSF diversion.  PLAN: - Cont EVD open at 15mmHg for now, may challenge or clamp in the next few days. - Cont IV Abx

## 2018-01-03 DIAGNOSIS — K567 Ileus, unspecified: Secondary | ICD-10-CM

## 2018-01-03 DIAGNOSIS — T85730D Infection and inflammatory reaction due to ventricular intracranial (communicating) shunt, subsequent encounter: Secondary | ICD-10-CM

## 2018-01-03 DIAGNOSIS — R7881 Bacteremia: Secondary | ICD-10-CM

## 2018-01-03 LAB — RENAL FUNCTION PANEL
Albumin: 2.1 g/dL — ABNORMAL LOW (ref 3.5–5.0)
Anion gap: 9 (ref 5–15)
BUN: 17 mg/dL (ref 6–20)
CO2: 25 mmol/L (ref 22–32)
Calcium: 8.1 mg/dL — ABNORMAL LOW (ref 8.9–10.3)
Chloride: 109 mmol/L (ref 101–111)
Creatinine, Ser: 0.91 mg/dL (ref 0.61–1.24)
GFR calc Af Amer: >60 mL/min (ref >60)
GFR calc non Af Amer: >60 mL/min (ref >60)
Glucose, Bld: 93 mg/dL (ref 65–99)
Phosphorus: 2.9 mg/dL (ref 2.5–4.6)
Potassium: 3.1 mmol/L — ABNORMAL LOW (ref 3.5–5.1)
Sodium: 143 mmol/L (ref 135–145)

## 2018-01-03 LAB — CBC WITH DIFFERENTIAL/PLATELET
Band Neutrophils: 0 %
Basophils Absolute: 0 10*3/uL (ref 0.0–0.1)
Basophils Relative: 0 %
Blasts: 0 %
Eosinophils Absolute: 0 10*3/uL (ref 0.0–0.7)
Eosinophils Relative: 0 %
HCT: 32.2 % — ABNORMAL LOW (ref 39.0–52.0)
Hemoglobin: 10.4 g/dL — ABNORMAL LOW (ref 13.0–17.0)
Lymphocytes Relative: 5 %
Lymphs Abs: 1.1 10*3/uL (ref 0.7–4.0)
MCH: 30.6 pg (ref 26.0–34.0)
MCHC: 32.3 g/dL (ref 30.0–36.0)
MCV: 94.7 fL (ref 78.0–100.0)
Metamyelocytes Relative: 0 %
Monocytes Absolute: 1.1 10*3/uL — ABNORMAL HIGH (ref 0.1–1.0)
Monocytes Relative: 5 %
Myelocytes: 0 %
Neutro Abs: 19 10*3/uL — ABNORMAL HIGH (ref 1.7–7.7)
Neutrophils Relative %: 90 %
Other: 0 %
Platelets: 363 10*3/uL (ref 150–400)
Promyelocytes Absolute: 0 %
RBC: 3.4 MIL/uL — ABNORMAL LOW (ref 4.22–5.81)
RDW: 14.4 % (ref 11.5–15.5)
WBC: 21.2 10*3/uL — ABNORMAL HIGH (ref 4.0–10.5)
nRBC: 1 /100 WBC — ABNORMAL HIGH

## 2018-01-03 LAB — MAGNESIUM: Magnesium: 1.9 mg/dL (ref 1.7–2.4)

## 2018-01-03 MED ORDER — POTASSIUM CHLORIDE 10 MEQ/50ML IV SOLN: 10.0000 meq | INTRAVENOUS | Status: DC

## 2018-01-03 MED ORDER — POTASSIUM CHLORIDE 10 MEQ/100ML IV SOLN
10.0000 meq | INTRAVENOUS | Status: AC
  Administered 2018-01-03 – 2018-01-04 (×6): 10 meq via INTRAVENOUS
  Filled 2018-01-03 (×7): qty 100

## 2018-01-03 NOTE — Progress Notes (Signed)
Patient ID: GRIER VU, male   DOB: 1991-04-16, 27 y.o.   MRN: [id number]          [facility]  Date of Admission:  12/30/2017           Day 4 vancomycin         ASSESSMENT: He has coagulase-negative staph bacteremia and VP shunt infection.  He is improving with vancomycin and shunt removal.  So far his CSF cultures are negative.  PLAN: 1. Continue vancomycin 2. 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 dolutegravir  50 mg Oral Daily  . emtricitabine-rilpivir-tenofovir AF  1 tablet Oral Q breakfast  . mouth rinse  15 mL Mouth Rinse BID  . sulfamethoxazole-trimethoprim  1 tablet Oral Once per day on Mon Wed Fri   Continuous Infusions: . sodium chloride 100 mL/hr at 01/03/18 0900  . fluconazole (DIFLUCAN) IV 200 mg (01/03/18 1028)  . lactated ringers 10 mL/hr at 01/01/18 1754  . levETIRAcetam 1,000 mg (01/03/18 0837)  . vancomycin 1,250 mg (01/03/18 1152)   PRN Meds:.acetaminophen, acetaminophen, hydrocortisone cream, morphine injection, ondansetron **OR** ondansetron (ZOFRAN) IV, prochlorperazine, QUEtiapine   SUBJECTIVE: He is feeling better.  He is having much less pain along his old shunt tract.  Review of Systems: Review of Systems  Unable to perform ROS: Mental acuity    Allergies  Allergen Reactions  . Acyclovir And Related     OBJECTIVE: Vitals:   01/03/18 0836 01/03/18 0900 01/03/18 1000 01/03/18 1121  BP:  113/84 (!) 126/91   Pulse:  66    Resp:  16 17   Temp: (!) 97.5 F (36.4 C)   97.8 F (36.6 C)  TempSrc:      SpO2:  95%    Weight:      Height:        Body mass index is 22.92 kg/m.  Physical Exam  Constitutional:  He is alert and appears comfortable except for not liking his NG tube.  Cardiovascular: Normal rate and regular rhythm.  No murmur heard. Pulmonary/Chest: Effort normal and breath sounds normal.  The erythema and tenderness along his previous shunt tract is resolving nicely.  Abdominal: Soft. He exhibits no distension. There is no tenderness.  Neurological: He is alert.    Lab Results Lab Results  Component Value Date   WBC 40.1 (H) 01/02/2018   HGB 10.4 (L) 01/02/2018   HCT 31.3 (L) 01/02/2018   MCV 95.7 01/02/2018   PLT 331 01/02/2018    Lab Results  Component Value Date   CREATININE 0.86 01/02/2018   BUN 6 01/02/2018   NA 140 01/02/2018   K 3.4 (L) 01/02/2018   CL 107 01/02/2018   CO2 24 01/02/2018    Lab Results  Component Value Date   ALT 35 12/30/2017   AST 44 (H) 12/30/2017   ALKPHOS 96 12/30/2017   BILITOT 1.8 (H) 12/30/2017     Microbiology: Recent Results (from the past 240 hour(s))  Culture, blood (Routine x 2)     Status: Abnormal (Preliminary result)   Collection Time: 12/30/17  6:30 PM  Result Value Ref Range Status   Specimen Description BLOOD RIGHT ANTECUBITAL  Final   Special Requests   Final    BOTTLES DRAWN AEROBIC AND ANAEROBIC Blood Culture adequate volume   Culture  Setup Time   Final    GRAM POSITIVE COCCI ANAEROBIC BOTTLE ONLY CRITICAL VALUE NOTED.  VALUE IS CONSISTENT WITH PREVIOUSLY REPORTED AND CALLED VALUE.    Culture (A)  Final    STAPHYLOCOCCUS SPECIES (COAGULASE NEGATIVE) REPEATING SUSCEPTIBILITIES    Report Status PENDING  Incomplete  Culture, blood (Routine x 2)     Status: Abnormal (Preliminary result)   Collection Time: 12/30/17  6:40 PM  Result Value Ref Range Status   Specimen Description BLOOD LEFT ANTECUBITAL  Final   Special Requests   Final    BOTTLES DRAWN AEROBIC AND ANAEROBIC Blood Culture adequate volume   Culture  Setup Time   Final    GRAM  POSITIVE COCCI IN CLUSTERS IN BOTH AEROBIC AND ANAEROBIC BOTTLES CRITICAL RESULT CALLED TO, READ BACK BY AND VERIFIED WITH: A. MYER PHARMD, AT 2021 12/31/17 BY D. VANHOOK    Culture STAPHYLOCOCCUS SPECIES (COAGULASE NEGATIVE) (A)  Final   Report Status PENDING  Incomplete  Blood Culture ID Panel (Reflexed)     Status: Abnormal   Collection Time: 12/30/17  6:40 PM  Result Value Ref Range Status   Enterococcus species NOT DETECTED NOT DETECTED Final   Listeria monocytogenes NOT DETECTED NOT DETECTED Final   Staphylococcus species DETECTED (A) NOT DETECTED Final    Comment: Methicillin (oxacillin) resistant coagulase negative staphylococcus. Possible blood culture contaminant (unless isolated from more than one blood culture draw or clinical case suggests pathogenicity). No antibiotic treatment is indicated for blood  culture contaminants. CRITICAL RESULT CALLED TO, READ BACK BY AND VERIFIED WITH: A. MYER PHARMD, AT 2021 12/31/17 BY D. VANHOOK    Staphylococcus aureus NOT DETECTED NOT DETECTED Final   Methicillin resistance DETECTED (A) NOT DETECTED Final    Comment: CRITICAL RESULT CALLED TO, READ BACK BY AND VERIFIED WITH: A. MYER PHARMD, AT 2021 12/31/17 BY D. VANHOOK    Streptococcus species NOT DETECTED NOT DETECTED Final   Streptococcus agalactiae NOT DETECTED NOT DETECTED Final   Streptococcus pneumoniae NOT DETECTED NOT DETECTED Final   Streptococcus pyogenes NOT DETECTED NOT DETECTED Final   Acinetobacter baumannii NOT DETECTED NOT DETECTED Final   Enterobacteriaceae species NOT DETECTED NOT DETECTED Final   Enterobacter cloacae complex NOT DETECTED NOT DETECTED Final   Escherichia coli NOT DETECTED NOT DETECTED Final   Klebsiella oxytoca NOT DETECTED NOT DETECTED Final   Klebsiella pneumoniae NOT DETECTED NOT DETECTED Final   Proteus species NOT DETECTED NOT DETECTED Final   Serratia marcescens NOT DETECTED NOT DETECTED Final   Haemophilus influenzae NOT DETECTED NOT DETECTED Final    Neisseria meningitidis NOT DETECTED NOT DETECTED Final   Pseudomonas aeruginosa NOT DETECTED NOT DETECTED Final   Candida albicans NOT DETECTED NOT DETECTED Final   Candida glabrata NOT DETECTED NOT DETECTED Final   Candida krusei NOT DETECTED NOT DETECTED Final   Candida parapsilosis NOT DETECTED NOT DETECTED Final   Candida tropicalis NOT DETECTED NOT DETECTED Final  MRSA PCR Screening     Status: None   Collection Time: 12/31/17  8:38 AM  Result Value Ref Range Status   MRSA by PCR NEGATIVE NEGATIVE Final    Comment:        The GeneXpert MRSA Assay (FDA approved for NASAL specimens only), is one component of a comprehensive MRSA  colonization surveillance program. It is not intended to diagnose MRSA infection nor to guide or monitor treatment for MRSA infections.   Urine Culture     Status: Abnormal   Collection Time: 12/31/17 10:54 AM  Result Value Ref Range Status   Specimen Description URINE, CLEAN CATCH  Final   Special Requests NONE  Final   Culture 40,000 COLONIES/mL ENTEROCOCCUS FAECALIS (A)  Final   Report Status 01/02/2018 FINAL  Final   Organism ID, Bacteria ENTEROCOCCUS FAECALIS (A)  Final      Susceptibility   Enterococcus faecalis - MIC*    AMPICILLIN <=2 SENSITIVE Sensitive     LEVOFLOXACIN 1 SENSITIVE Sensitive     NITROFURANTOIN <=16 SENSITIVE Sensitive     VANCOMYCIN 1 SENSITIVE Sensitive     * 40,000 COLONIES/mL ENTEROCOCCUS FAECALIS  Surgical pcr screen     Status: None   Collection Time: 01/01/18  2:08 PM  Result Value Ref Range Status   MRSA, PCR NEGATIVE NEGATIVE Final   Staphylococcus aureus NEGATIVE NEGATIVE Final    Comment: (NOTE) The Xpert SA Assay (FDA approved for NASAL specimens in patients 21 years of age and older), is one component of a comprehensive surveillance program. It is not intended to diagnose infection nor to guide or monitor treatment.   CSF culture     Status: None (Preliminary result)   Collection Time: 01/01/18  7:37  PM  Result Value Ref Range Status   Specimen Description CSF  Final   Special Requests SHUNT  Final   Gram Stain   Final    WBC PRESENT, PREDOMINANTLY MONONUCLEAR NO ORGANISMS SEEN CYTOSPIN SMEAR    Culture NO GROWTH 1 DAY  Final   Report Status PENDING  Incomplete  Aerobic/Anaerobic Culture (surgical/deep wound)     Status: None (Preliminary result)   Collection Time: 01/01/18  7:39 PM  Result Value Ref Range Status   Specimen Description WOUND  Final   Special Requests VP SHUNT FROM BRAIN  Final   Gram Stain   Final    ABUNDANT WBC PRESENT,BOTH PMN AND MONONUCLEAR NO ORGANISMS SEEN    Culture CULTURE REINCUBATED FOR BETTER GROWTH  Final   Report Status PENDING  Incomplete    Cliffton Asters, MD [facility] [phone] pager   [phone] cell 01/03/2018, 12:39 PM

## 2018-01-03 NOTE — Progress Notes (Signed)
Order for central ine K, pt has peripheral MD Ogbata paged to clarify.

## 2018-01-03 NOTE — Progress Notes (Signed)
PROGRESS NOTE    Justin Ball  ZOX:[id number] DOB: [birth year] DOA: 12/30/2017 PCP: No primary care provider on file.    Brief Narrative:  Justin Ball is a 27 y.o. 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  He was placed on bactrim which he has taken 3 doses.  The rash has gotten worse and is tracking up along where his shunt is under his skin along his chest wall.  Pt came in for fever and chills.  Denies any urinary symptoms.  No cough.  No sob.  Pt referred for admission for cellulitis associated with his vp shunt.  Both ID and neurosurg called and will both see in the am.  Patient admitted with sepsis, and VP shunt infection. He is on IV antibiotics. He underwent VP shunt removal and placement of EVD on 1-08 by neurosurgery. He also develops ileus secondary to VP shunt infection. General surgery has been following patient. No new complaints today.   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  Presents with fever, leukocytosis, tachycardia, tachypnea.  Neurosurgery consulted. Patient underwent  VP shunt removal, placement of EVD on 1-08 Continue with IV fluids.  Follow urine culture grew enterococcus. , blood culture. Growing staph, methicillin resistant. Ceftazidime discontinue.  Continue with Vancomycin. ID helping with IV antibiotics.   2-Abdominal Pain; Nausea, vomiting; Ileus.  KUB with sign of SBO vs ileus.  Concern with  peritonitis, general sx was consulted. Continue with IV antibiotics.  General surgery following.  Optimize electrolytes.   AIDS: Continue with Tivicay, Odefsey.  ID consulted. On Fluconazole for history of cryptococcus Meningitis.   Seizure: Continue with Keppra. Change to IV due to NPO.   Hypokalemia: IV Kcl. Continue to monitor.   S/P VP Shunt. Neurosurgery has been consulted due to infection.  He had VP shunt for cryptococcus meningitis.   Chronic viral hepatitis. Follow by ID.   Bipolar; on Seroquel.     DVT prophylaxis: SCD Code Status: Full code.  Family Communication: mother at bedside 1-07.  Disposition Plan: remain inpatient.     Consultants:   Neurosurgery   ID   Procedures:   none   Antimicrobials:   Vancomycin 1-6  Ceftriaxone 1-06--107---Ceftazidime 1-07---1-08  Bactrim 1-06. PCP prophylaxis.    Subjective: He wants to eat. He report improvement of abdominal pain. He has not pass gas/    Objective: Vitals:   01/03/18 1100 01/03/18 1121 01/03/18 1200 01/03/18 1300  BP: (!) 125/109  (!) 125/93 (!) 130/110  Pulse: 74  62   Resp: 17  17 19   Temp:  97.8 F (36.6 C)    TempSrc:      SpO2: 96%  99%   Weight:      Height:        Intake/Output Summary (Last 24 hours) at 01/03/2018 1431 Last data filed at 01/03/2018 1300 Gross per 24 hour  Intake 3370 ml  Output 3112  ml  Net 258 ml   Filed Weights   12/30/17 1808 12/31/17 0758  Weight: 81.6 kg (180 lb) 78.8 kg (173 lb 11.6 oz)    Examination:  General exam: Alert in no distress.  HEENT; head with EVD in place.  Respiratory system: CTA Cardiovascular system: S 1, S 2 RRR Gastrointestinal system: BS hypoactive. Central nervous system: Alert and oriented, speech clear, moves all 4 extremities.  Extremities:Symmetric power.    Data Reviewed: I have personally reviewed following labs and imaging studies  CBC: Recent Labs  Lab 12/30/17 1830 12/31/17 0439 01/01/18 0842  01/02/18 0748  WBC 27.7* 29.5* 42.7* 40.1*  NEUTROABS 23.9*  --   --   --   HGB 12.4* 12.3* 11.2* 10.4*  HCT 37.1* 35.7* 32.9* 31.3*  MCV 95.1 94.7 94.8 95.7  PLT 403* 360 375 331   Basic Metabolic Panel: Recent Labs  Lab 12/30/17 1830 12/31/17 0439 01/01/18 0842 01/02/18 0748  NA 132* 135 136 140  K 3.1* 2.8* 3.2* 3.4*  CL 98* 105 104 107  CO2 23 20* 22 24  GLUCOSE 116* 98 117* 104*  BUN 6 7 5* 6  CREATININE 1.33* 1.21 1.02 0.86  CALCIUM 8.9 8.1* 8.5* 8.3*   GFR: Estimated Creatinine Clearance: 145.1 mL/min (by C-G formula based on SCr of 0.86 mg/dL). Liver Function Tests: Recent Labs  Lab 12/30/17 1830  AST 44*  ALT 35  ALKPHOS 96  BILITOT 1.8*  PROT 9.0*  ALBUMIN 3.1*   No results for input(s): LIPASE, AMYLASE in the last 168 hours. No results for input(s): AMMONIA in the last 168 hours. Coagulation Profile: Recent Labs  Lab 12/30/17 1830  INR 1.14   Cardiac Enzymes: No results for input(s): CKTOTAL, CKMB, CKMBINDEX, TROPONINI in the last 168 hours. BNP (last 3 results) No results for input(s): PROBNP in the last 8760 hours. HbA1C: No results for input(s): HGBA1C in the last 72 hours. CBG: No results for input(s): GLUCAP in the last 168 hours. Lipid Profile: No results for input(s): CHOL, HDL, LDLCALC, TRIG, CHOLHDL, LDLDIRECT in the last 72 hours. Thyroid Function Tests: No results for input(s): TSH, T4TOTAL, FREET4, T3FREE, THYROIDAB in the last 72 hours. Anemia Panel: No results for input(s): VITAMINB12, FOLATE, FERRITIN, TIBC, IRON, RETICCTPCT in the last 72 hours. Sepsis Labs: Recent Labs  Lab 12/30/17 1848 12/30/17 2042  LATICACIDVEN 1.54 1.72    Recent Results (from the past 240 hour(s))  Culture, blood (Routine x 2)     Status: Abnormal (Preliminary result)   Collection Time: 12/30/17  6:30 PM  Result Value Ref Range Status   Specimen Description BLOOD RIGHT ANTECUBITAL  Final   Special Requests   Final    BOTTLES DRAWN AEROBIC AND  ANAEROBIC Blood Culture adequate volume   Culture  Setup Time   Final    GRAM POSITIVE COCCI ANAEROBIC BOTTLE ONLY CRITICAL VALUE NOTED.  VALUE IS CONSISTENT WITH PREVIOUSLY REPORTED AND CALLED VALUE.    Culture (A)  Final    STAPHYLOCOCCUS SPECIES (COAGULASE NEGATIVE) REPEATING SUSCEPTIBILITIES    Report Status PENDING  Incomplete  Culture, blood (Routine x 2)     Status: Abnormal (Preliminary result)   Collection Time: 12/30/17  6:40 PM  Result Value Ref Range Status   Specimen Description BLOOD LEFT ANTECUBITAL  Final   Special Requests   Final    BOTTLES DRAWN AEROBIC AND ANAEROBIC Blood Culture adequate volume   Culture  Setup Time   Final    GRAM POSITIVE COCCI  IN CLUSTERS IN BOTH AEROBIC AND ANAEROBIC BOTTLES CRITICAL RESULT CALLED TO, READ BACK BY AND VERIFIED WITH: A. MYER PHARMD, AT 2021 12/31/17 BY D. VANHOOK    Culture STAPHYLOCOCCUS SPECIES (COAGULASE NEGATIVE) (A)  Final   Report Status PENDING  Incomplete  Blood Culture ID Panel (Reflexed)     Status: Abnormal   Collection Time: 12/30/17  6:40 PM  Result Value Ref Range Status   Enterococcus species NOT DETECTED NOT DETECTED Final   Listeria monocytogenes NOT DETECTED NOT DETECTED Final   Staphylococcus species DETECTED (A) NOT DETECTED Final    Comment: Methicillin (oxacillin) resistant coagulase negative staphylococcus. Possible blood culture contaminant (unless isolated from more than one blood culture draw or clinical case suggests pathogenicity). No antibiotic treatment is indicated for blood  culture contaminants. CRITICAL RESULT CALLED TO, READ BACK BY AND VERIFIED WITH: A. MYER PHARMD, AT 2021 12/31/17 BY D. VANHOOK    Staphylococcus aureus NOT DETECTED NOT DETECTED Final   Methicillin resistance DETECTED (A) NOT DETECTED Final    Comment: CRITICAL RESULT CALLED TO, READ BACK BY AND VERIFIED WITH: A. MYER PHARMD, AT 2021 12/31/17 BY D. VANHOOK    Streptococcus species NOT DETECTED NOT DETECTED Final    Streptococcus agalactiae NOT DETECTED NOT DETECTED Final   Streptococcus pneumoniae NOT DETECTED NOT DETECTED Final   Streptococcus pyogenes NOT DETECTED NOT DETECTED Final   Acinetobacter baumannii NOT DETECTED NOT DETECTED Final   Enterobacteriaceae species NOT DETECTED NOT DETECTED Final   Enterobacter cloacae complex NOT DETECTED NOT DETECTED Final   Escherichia coli NOT DETECTED NOT DETECTED Final   Klebsiella oxytoca NOT DETECTED NOT DETECTED Final   Klebsiella pneumoniae NOT DETECTED NOT DETECTED Final   Proteus species NOT DETECTED NOT DETECTED Final   Serratia marcescens NOT DETECTED NOT DETECTED Final   Haemophilus influenzae NOT DETECTED NOT DETECTED Final   Neisseria meningitidis NOT DETECTED NOT DETECTED Final   Pseudomonas aeruginosa NOT DETECTED NOT DETECTED Final   Candida albicans NOT DETECTED NOT DETECTED Final   Candida glabrata NOT DETECTED NOT DETECTED Final   Candida krusei NOT DETECTED NOT DETECTED Final   Candida parapsilosis NOT DETECTED NOT DETECTED Final   Candida tropicalis NOT DETECTED NOT DETECTED Final  MRSA PCR Screening     Status: None   Collection Time: 12/31/17  8:38 AM  Result Value Ref Range Status   MRSA by PCR NEGATIVE NEGATIVE Final    Comment:        The GeneXpert MRSA Assay (FDA approved for NASAL specimens only), is one component of a comprehensive MRSA colonization surveillance program. It is not intended to diagnose MRSA infection nor to guide or monitor treatment for MRSA infections.   Urine Culture     Status: Abnormal   Collection Time: 12/31/17 10:54 AM  Result Value Ref Range Status   Specimen Description URINE, CLEAN CATCH  Final   Special Requests NONE  Final   Culture 40,000 COLONIES/mL ENTEROCOCCUS FAECALIS (A)  Final   Report Status 01/02/2018 FINAL  Final   Organism ID, Bacteria ENTEROCOCCUS FAECALIS (A)  Final      Susceptibility   Enterococcus faecalis - MIC*    AMPICILLIN <=2 SENSITIVE Sensitive     LEVOFLOXACIN  1 SENSITIVE Sensitive     NITROFURANTOIN <=16 SENSITIVE Sensitive     VANCOMYCIN 1 SENSITIVE Sensitive     * 40,000 COLONIES/mL ENTEROCOCCUS FAECALIS  Surgical pcr screen     Status: None   Collection Time: 01/01/18  2:08 PM  Result  Value Ref Range Status   MRSA, PCR NEGATIVE NEGATIVE Final   Staphylococcus aureus NEGATIVE NEGATIVE Final    Comment: (NOTE) The Xpert SA Assay (FDA approved for NASAL specimens in patients 81 years of age and older), is one component of a comprehensive surveillance program. It is not intended to diagnose infection nor to guide or monitor treatment.   CSF culture     Status: None (Preliminary result)   Collection Time: 01/01/18  7:37 PM  Result Value Ref Range Status   Specimen Description CSF  Final   Special Requests SHUNT  Final   Gram Stain   Final    WBC PRESENT, PREDOMINANTLY MONONUCLEAR NO ORGANISMS SEEN CYTOSPIN SMEAR    Culture NO GROWTH 1 DAY  Final   Report Status PENDING  Incomplete  Aerobic/Anaerobic Culture (surgical/deep wound)     Status: None (Preliminary result)   Collection Time: 01/01/18  7:39 PM  Result Value Ref Range Status   Specimen Description WOUND  Final   Special Requests VP SHUNT FROM BRAIN  Final   Gram Stain   Final    ABUNDANT WBC PRESENT,BOTH PMN AND MONONUCLEAR NO ORGANISMS SEEN    Culture CULTURE REINCUBATED FOR BETTER GROWTH  Final   Report Status PENDING  Incomplete         Radiology Studies: Dg Abd Portable 1v  Result Date: 01/02/2018 CLINICAL DATA:  Ileus EXAM: PORTABLE ABDOMEN - 1 VIEW COMPARISON:  January 01, 2018 FINDINGS: Nasogastric tube tip and side port are in the distal stomach region. There remain loops of dilated small bowel. Air is seen in colon and rectum. There is moderate stool in the colon. No air-fluid levels. No free air evident. IMPRESSION: Nasogastric tube tip and side port in distal stomach. Multiple loops of dilated small bowel. Suspect ileus or enteritis as most likely etiologies.  A degree of partial small bowel obstruction cannot be entirely excluded. No free air evident. Electronically Signed   By: Bretta Bang III M.D.   On: 01/02/2018 08:21        Scheduled Meds: . dolutegravir  50 mg Oral Daily  . emtricitabine-rilpivir-tenofovir AF  1 tablet Oral Q breakfast  . mouth rinse  15 mL Mouth Rinse BID  . sulfamethoxazole-trimethoprim  1 tablet Oral Once per day on Mon Wed Fri   Continuous Infusions: . sodium chloride 100 mL/hr at 01/03/18 0900  . fluconazole (DIFLUCAN) IV 200 mg (01/03/18 1028)  . lactated ringers 10 mL/hr at 01/01/18 1754  . levETIRAcetam 1,000 mg (01/03/18 0837)  . vancomycin 1,250 mg (01/03/18 1152)     LOS: 3 days    Time spent: 35 minutes.     Barnetta Chapel, MD [facility] Pager [phone]  If 7PM-7AM, please contact night-coverage [email or web address] Password [facility] 01/03/2018, 2:31 PM

## 2018-01-03 NOTE — Progress Notes (Signed)
Pt indicates his stomach hurts now and he feels sick. Belly is more distended, and now tender. LIWS restarted per Dr Harlon Florseui instruction. 500 ml collected into cannister within the first minute.

## 2018-01-03 NOTE — Progress Notes (Signed)
No issues overnight. Pt has improved abd pain, not c/o nausea currently with NGT in place to wall suction.  EXAM:  BP 108/75   Pulse (!) 55   Temp 97.8 F (36.6 C) (Oral)   Resp 20   Ht 6\' 1"  (1.854 m)   Wt 78.8 kg (173 lb 11.6 oz)   SpO2 98%   BMI 22.92 kg/m   Awake, alert, oriented  Speech fluent, appropriate  CN grossly intact  5/5 BUE/BLE EVD in place, clear CSF ~62cc x24 hrs  CSF culture neg x1d Catheter culture (+) H. Influenzae  IMPRESSION:  27 y.o. male with shunt infection s/p removal and EVD placement.   PLAN: - Cont EVD drainage at 15mmHg - IV abx per ID - Cont NGT for ileus likely to improve now that infected peritoneal shunt catheter is removed.

## 2018-01-03 NOTE — Progress Notes (Signed)
2 Days Post-Op   Subjective/Chief Complaint: Patient wants to go home No abdominal complaints No BM yesterday - unsure of flatus   Objective: Vital signs in last 24 hours: Temp:  [97.6 F (36.4 C)-98 F (36.7 C)] 98 F (36.7 C) (01/10 0400) Pulse Rate:  [66-99] 90 (01/10 0700) Resp:  [14-22] 14 (01/10 0700) BP: (109-129)/(70-90) 117/79 (01/10 0700) SpO2:  [94 %-100 %] 97 % (01/10 0700) Last BM Date: 01/01/18  Intake/Output from previous day: 01/09 0701 - 01/10 0700 In: 3310 [I.V.:2400; IV Piggyback:910] Out: 3288 [Urine:1425; Emesis/NG output:1800; Drains:63] Intake/Output this shift: No intake/output data recorded.  General appearance: alert, cooperative and no distress GI: soft, non-tender; minimally distended; hypoactive bowel sounds  Lab Results:  Recent Labs    01/01/18 0842 01/02/18 0748  WBC 42.7* 40.1*  HGB 11.2* 10.4*  HCT 32.9* 31.3*  PLT 375 331   BMET Recent Labs    01/01/18 0842 01/02/18 0748  NA 136 140  K 3.2* 3.4*  CL 104 107  CO2 22 24  GLUCOSE 117* 104*  BUN 5* 6  CREATININE 1.02 0.86  CALCIUM 8.5* 8.3*   PT/INR No results for input(s): LABPROT, INR in the last 72 hours. ABG No results for input(s): PHART, HCO3 in the last 72 hours.  Invalid input(s): PCO2, PO2  Studies/Results: Dg Abd Portable 1v  Result Date: 01/02/2018 CLINICAL DATA:  Ileus EXAM: PORTABLE ABDOMEN - 1 VIEW COMPARISON:  January 01, 2018 FINDINGS: Nasogastric tube tip and side port are in the distal stomach region. There remain loops of dilated small bowel. Air is seen in colon and rectum. There is moderate stool in the colon. No air-fluid levels. No free air evident. IMPRESSION: Nasogastric tube tip and side port in distal stomach. Multiple loops of dilated small bowel. Suspect ileus or enteritis as most likely etiologies. A degree of partial small bowel obstruction cannot be entirely excluded. No free air evident. Electronically Signed   By: Bretta BangWilliam  Woodruff III M.D.    On: 01/02/2018 08:21    Anti-infectives: Anti-infectives (From admission, onward)   Start     Dose/Rate Route Frequency Ordered Stop   01/03/18 1000  fluconazole (DIFLUCAN) IVPB 200 mg     200 mg 100 mL/hr over 60 Minutes Intravenous Every 24 hours 01/02/18 1525     01/02/18 1100  vancomycin (VANCOCIN) 1,250 mg in sodium chloride 0.9 % 250 mL IVPB     1,250 mg 166.7 mL/hr over 90 Minutes Intravenous Every 8 hours 01/02/18 1025     01/02/18 0900  sulfamethoxazole-trimethoprim (BACTRIM DS,SEPTRA DS) 800-160 MG per tablet 1 tablet     1 tablet Oral Once per day on Mon Wed Fri 12/31/17 1003     01/01/18 0930  fluconazole (DIFLUCAN) IVPB 400 mg  Status:  Discontinued     400 mg 100 mL/hr over 120 Minutes Intravenous Every 24 hours 01/01/18 0830 01/02/18 1525   12/31/17 2200  vancomycin (VANCOCIN) IVPB 1000 mg/200 mL premix  Status:  Discontinued     1,000 mg 200 mL/hr over 60 Minutes Intravenous Every 8 hours 12/31/17 2048 01/02/18 1025   12/31/17 1400  cefTAZidime (FORTAZ) 2 g in dextrose 5 % 50 mL IVPB  Status:  Discontinued     2 g 100 mL/hr over 30 Minutes Intravenous Every 8 hours 12/31/17 1119 01/01/18 0808   12/31/17 1200  dolutegravir (TIVICAY) tablet 50 mg     50 mg Oral Daily 12/31/17 0207     12/31/17 1200  emtricitabine-rilpivir-tenofovir AF (  ODEFSEY) 200-25-25 MG per tablet 1 tablet     1 tablet Oral Daily with breakfast 12/31/17 0207     12/31/17 1100  fluconazole (DIFLUCAN) tablet 400 mg  Status:  Discontinued     400 mg Oral Daily 12/31/17 1003 01/01/18 0829   12/31/17 1000  vancomycin (VANCOCIN) IVPB 1000 mg/200 mL premix  Status:  Discontinued     1,000 mg 200 mL/hr over 60 Minutes Intravenous 2 times daily 12/31/17 0444 12/31/17 2048   12/31/17 0215  sulfamethoxazole-trimethoprim (BACTRIM DS,SEPTRA DS) 800-160 MG per tablet 1 tablet  Status:  Discontinued     1 tablet Oral 2 times daily 12/31/17 0207 12/31/17 1003   12/31/17 0215  cefTRIAXone (ROCEPHIN) 2 g in  dextrose 5 % 50 mL IVPB  Status:  Discontinued     2 g 100 mL/hr over 30 Minutes Intravenous Every 24 hours 12/31/17 0207 12/31/17 1119   12/30/17 2345  vancomycin (VANCOCIN) 1,500 mg in sodium chloride 0.9 % 500 mL IVPB     1,500 mg 250 mL/hr over 120 Minutes Intravenous  Once 12/30/17 2331 12/31/17 0324   12/30/17 2330  cefTRIAXone (ROCEPHIN) 2 g in dextrose 5 % 50 mL IVPB     2 g 100 mL/hr over 30 Minutes Intravenous  Once 12/30/17 2328 12/31/17 0122      Assessment/Plan: Ileus secondary to infected VP shunt s/p removal of VPS Clamp NG tube Sips of clear liquids  Replace to suction if patient becomes nauseated.  LOS: 3 days    Justin Ball 01/03/2018

## 2018-01-04 ENCOUNTER — Inpatient Hospital Stay (HOSPITAL_COMMUNITY): Payer: Medicaid Other

## 2018-01-04 DIAGNOSIS — B957 Other staphylococcus as the cause of diseases classified elsewhere: Secondary | ICD-10-CM

## 2018-01-04 DIAGNOSIS — Z881 Allergy status to other antibiotic agents status: Secondary | ICD-10-CM

## 2018-01-04 LAB — RENAL FUNCTION PANEL
Albumin: 2.2 g/dL — ABNORMAL LOW (ref 3.5–5.0)
Anion gap: 7 (ref 5–15)
BUN: 11 mg/dL (ref 6–20)
CO2: 26 mmol/L (ref 22–32)
Calcium: 8.1 mg/dL — ABNORMAL LOW (ref 8.9–10.3)
Chloride: 109 mmol/L (ref 101–111)
Creatinine, Ser: 0.99 mg/dL (ref 0.61–1.24)
GFR calc Af Amer: >60 mL/min (ref >60)
GFR calc non Af Amer: >60 mL/min (ref >60)
Glucose, Bld: 94 mg/dL (ref 65–99)
Phosphorus: 2.6 mg/dL (ref 2.5–4.6)
Potassium: 3.2 mmol/L — ABNORMAL LOW (ref 3.5–5.1)
Sodium: 142 mmol/L (ref 135–145)

## 2018-01-04 LAB — CULTURE, BLOOD (ROUTINE X 2)
Special Requests: ADEQUATE
Special Requests: ADEQUATE

## 2018-01-04 LAB — CBC WITH DIFFERENTIAL/PLATELET
Band Neutrophils: 5 %
Basophils Absolute: 0 10*3/uL (ref 0.0–0.1)
Basophils Relative: 0 %
Blasts: 0 %
Eosinophils Absolute: 0 10*3/uL (ref 0.0–0.7)
Eosinophils Relative: 0 %
HCT: 31.9 % — ABNORMAL LOW (ref 39.0–52.0)
Hemoglobin: 10.4 g/dL — ABNORMAL LOW (ref 13.0–17.0)
Lymphocytes Relative: 3 %
Lymphs Abs: 0.4 10*3/uL — ABNORMAL LOW (ref 0.7–4.0)
MCH: 31.4 pg (ref 26.0–34.0)
MCHC: 32.6 g/dL (ref 30.0–36.0)
MCV: 96.4 fL (ref 78.0–100.0)
Metamyelocytes Relative: 5 %
Monocytes Absolute: 0.6 10*3/uL (ref 0.1–1.0)
Monocytes Relative: 4 %
Myelocytes: 2 %
Neutro Abs: 13.7 10*3/uL — ABNORMAL HIGH (ref 1.7–7.7)
Neutrophils Relative %: 81 %
Other: 0 %
Platelets: 355 10*3/uL (ref 150–400)
Promyelocytes Absolute: 0 %
RBC: 3.31 MIL/uL — ABNORMAL LOW (ref 4.22–5.81)
RDW: 14.8 % (ref 11.5–15.5)
WBC: 14.7 10*3/uL — ABNORMAL HIGH (ref 4.0–10.5)
nRBC: 1 /100 WBC — ABNORMAL HIGH

## 2018-01-04 LAB — VANCOMYCIN, TROUGH: Vancomycin Tr: 30 ug/mL (ref 15–20)

## 2018-01-04 MED ORDER — VANCOMYCIN HCL IN DEXTROSE 1-5 GM/200ML-% IV SOLN
1000.0000 mg | Freq: Three times a day (TID) | INTRAVENOUS | Status: DC
  Administered 2018-01-05 – 2018-01-07 (×7): 1000 mg via INTRAVENOUS
  Filled 2018-01-04 (×8): qty 200

## 2018-01-04 MED ORDER — FLUCONAZOLE 100 MG PO TABS
200.0000 mg | ORAL_TABLET | Freq: Every day | ORAL | Status: DC
  Administered 2018-01-04 – 2018-01-17 (×14): 200 mg via ORAL
  Filled 2018-01-04: qty 2
  Filled 2018-01-04: qty 1
  Filled 2018-01-04 (×2): qty 2
  Filled 2018-01-04: qty 1
  Filled 2018-01-04 (×2): qty 2
  Filled 2018-01-04: qty 1
  Filled 2018-01-04: qty 2
  Filled 2018-01-04: qty 1
  Filled 2018-01-04: qty 2
  Filled 2018-01-04 (×2): qty 1
  Filled 2018-01-04: qty 2

## 2018-01-04 NOTE — Progress Notes (Signed)
Patient ID: Justin Ball, male   DOB: [birth year], 27 y.o.   MRN: [id number]          [facility] for Infectious Disease  Date of Admission:  12/30/2017           Day 5 vancomycin         ASSESSMENT: He has coagulase-negative staph bacteremia and VP shunt infection.  He is improving with vancomycin and shunt removal.  His CSF cultures are negative at 48 hours.  If they remain negative he will need only 10 days of total IV vancomycin therapy.  If they were to turn positive he needs a total of 14 days of therapy.  If he does need to have a new permanent shunt placed it could be safely placed early next week.  PLAN: 1. Continue vancomycin 2. Await results of CSF culture 3. 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 dolutegravir  50 mg Oral Daily  . emtricitabine-rilpivir-tenofovir AF  1 tablet Oral Q breakfast  . fluconazole  200 mg Oral Daily  . mouth rinse  15 mL Mouth Rinse BID  . sulfamethoxazole-trimethoprim  1 tablet Oral Once per day on Mon Wed Fri   Continuous Infusions: . sodium chloride 100 mL/hr at 01/03/18 1956  . lactated ringers 10 mL/hr at 01/01/18 1754  . levETIRAcetam Stopped (01/04/18 1137)   PRN Meds:.acetaminophen, acetaminophen, hydrocortisone cream, morphine injection, ondansetron **OR** ondansetron (ZOFRAN) IV, prochlorperazine, QUEtiapine   SUBJECTIVE: He is feeling better.  He says that he is sleepy today.  Review of Systems: Review of Systems  Unable to perform ROS: Mental acuity    Allergies  Allergen Reactions  . Acyclovir And Related      OBJECTIVE: Vitals:   01/04/18 0900 01/04/18 1000 01/04/18 1100 01/04/18 1200  BP: 123/85 (!) 131/98 124/89   Pulse: 87 81 84   Resp: (!) 21 (!) 21 (!) 22   Temp:    98.6 F (37 C)  TempSrc:    Oral  SpO2: 95% 95% 96%   Weight:      Height:       Body mass index is 22.92 kg/m.  Physical Exam  Constitutional:  He is alert and appears comfortable.  Cardiovascular: Normal rate and regular rhythm.  No murmur heard. Pulmonary/Chest: Effort normal and breath sounds normal.  The erythema and tenderness along his previous shunt tract is resolving nicely.  Abdominal: Soft. He exhibits no distension. There is no tenderness.  Neurological: He is alert.    Lab Results Lab Results  Component Value Date   WBC 14.7 (H) 01/04/2018   HGB 10.4 (L) 01/04/2018   HCT 31.9 (L) 01/04/2018   MCV 96.4 01/04/2018   PLT 355 01/04/2018    Lab Results  Component Value Date   CREATININE 0.99 01/04/2018   BUN 11 01/04/2018   NA 142 01/04/2018   K 3.2 (L) 01/04/2018   CL 109 01/04/2018   CO2 26 01/04/2018    Lab Results  Component Value Date   ALT 35 12/30/2017   AST 44 (H) 12/30/2017  ALKPHOS 96 12/30/2017   BILITOT 1.8 (H) 12/30/2017     Microbiology: Recent Results (from the past 240 hour(s))  Culture, blood (Routine x 2)     Status: Abnormal   Collection Time: 12/30/17  6:30 PM  Result Value Ref Range Status   Specimen Description BLOOD RIGHT ANTECUBITAL  Final   Special Requests   Final    BOTTLES DRAWN AEROBIC AND ANAEROBIC Blood Culture adequate volume   Culture  Setup Time   Final    GRAM POSITIVE COCCI ANAEROBIC BOTTLE ONLY CRITICAL VALUE NOTED.  VALUE IS CONSISTENT WITH PREVIOUSLY REPORTED AND CALLED VALUE.    Culture STAPHYLOCOCCUS EPIDERMIDIS (A)  Final   Report Status 01/04/2018 FINAL  Final   Organism ID, Bacteria STAPHYLOCOCCUS EPIDERMIDIS  Final      Susceptibility   Staphylococcus epidermidis - MIC*    CIPROFLOXACIN <=0.5 SENSITIVE Sensitive      ERYTHROMYCIN 0.5 SENSITIVE Sensitive     GENTAMICIN <=0.5 SENSITIVE Sensitive     OXACILLIN <=0.25 SENSITIVE Sensitive     TETRACYCLINE <=1 SENSITIVE Sensitive     VANCOMYCIN <=0.5 SENSITIVE Sensitive     TRIMETH/SULFA <=10 SENSITIVE Sensitive     CLINDAMYCIN <=0.25 SENSITIVE Sensitive     RIFAMPIN <=0.5 SENSITIVE Sensitive     Inducible Clindamycin NEGATIVE Sensitive     * STAPHYLOCOCCUS EPIDERMIDIS  Culture, blood (Routine x 2)     Status: Abnormal   Collection Time: 12/30/17  6:40 PM  Result Value Ref Range Status   Specimen Description BLOOD LEFT ANTECUBITAL  Final   Special Requests   Final    BOTTLES DRAWN AEROBIC AND ANAEROBIC Blood Culture adequate volume   Culture  Setup Time   Final    GRAM POSITIVE COCCI IN CLUSTERS IN BOTH AEROBIC AND ANAEROBIC BOTTLES CRITICAL RESULT CALLED TO, READ BACK BY AND VERIFIED WITH: A. MYER PHARMD, AT 2021 12/31/17 BY D. VANHOOK    Culture (A)  Final    STAPHYLOCOCCUS EPIDERMIDIS SUSCEPTIBILITIES PERFORMED ON PREVIOUS CULTURE WITHIN THE LAST 5 DAYS.    Report Status 01/04/2018 FINAL  Final  Blood Culture ID Panel (Reflexed)     Status: Abnormal   Collection Time: 12/30/17  6:40 PM  Result Value Ref Range Status   Enterococcus species NOT DETECTED NOT DETECTED Final   Listeria monocytogenes NOT DETECTED NOT DETECTED Final   Staphylococcus species DETECTED (A) NOT DETECTED Final    Comment: Methicillin (oxacillin) resistant coagulase negative staphylococcus. Possible blood culture contaminant (unless isolated from more than one blood culture draw or clinical case suggests pathogenicity). No antibiotic treatment is indicated for blood  culture contaminants. CRITICAL RESULT CALLED TO, READ BACK BY AND VERIFIED WITH: A. MYER PHARMD, AT 2021 12/31/17 BY D. VANHOOK    Staphylococcus aureus NOT DETECTED NOT DETECTED Final   Methicillin resistance DETECTED (A) NOT DETECTED Final    Comment: CRITICAL RESULT CALLED TO, READ BACK BY AND VERIFIED  WITH: A. MYER PHARMD, AT 2021 12/31/17 BY D. VANHOOK    Streptococcus species NOT DETECTED NOT DETECTED Final   Streptococcus agalactiae NOT DETECTED NOT DETECTED Final   Streptococcus pneumoniae NOT DETECTED NOT DETECTED Final   Streptococcus pyogenes NOT DETECTED NOT DETECTED Final   Acinetobacter baumannii NOT DETECTED NOT DETECTED Final   Enterobacteriaceae species NOT DETECTED NOT DETECTED Final   Enterobacter cloacae complex NOT DETECTED NOT DETECTED Final   Escherichia coli NOT DETECTED NOT DETECTED Final   Klebsiella oxytoca NOT DETECTED NOT DETECTED Final   Klebsiella pneumoniae NOT DETECTED  NOT DETECTED Final   Proteus species NOT DETECTED NOT DETECTED Final   Serratia marcescens NOT DETECTED NOT DETECTED Final   Haemophilus influenzae NOT DETECTED NOT DETECTED Final   Neisseria meningitidis NOT DETECTED NOT DETECTED Final   Pseudomonas aeruginosa NOT DETECTED NOT DETECTED Final   Candida albicans NOT DETECTED NOT DETECTED Final   Candida glabrata NOT DETECTED NOT DETECTED Final   Candida krusei NOT DETECTED NOT DETECTED Final   Candida parapsilosis NOT DETECTED NOT DETECTED Final   Candida tropicalis NOT DETECTED NOT DETECTED Final  MRSA PCR Screening     Status: None   Collection Time: 12/31/17  8:38 AM  Result Value Ref Range Status   MRSA by PCR NEGATIVE NEGATIVE Final    Comment:        The GeneXpert MRSA Assay (FDA approved for NASAL specimens only), is one component of a comprehensive MRSA colonization surveillance program. It is not intended to diagnose MRSA infection nor to guide or monitor treatment for MRSA infections.   Urine Culture     Status: Abnormal   Collection Time: 12/31/17 10:54 AM  Result Value Ref Range Status   Specimen Description URINE, CLEAN CATCH  Final   Special Requests NONE  Final   Culture 40,000 COLONIES/mL ENTEROCOCCUS FAECALIS (A)  Final   Report Status 01/02/2018 FINAL  Final   Organism ID, Bacteria ENTEROCOCCUS FAECALIS (A)   Final      Susceptibility   Enterococcus faecalis - MIC*    AMPICILLIN <=2 SENSITIVE Sensitive     LEVOFLOXACIN 1 SENSITIVE Sensitive     NITROFURANTOIN <=16 SENSITIVE Sensitive     VANCOMYCIN 1 SENSITIVE Sensitive     * 40,000 COLONIES/mL ENTEROCOCCUS FAECALIS  Surgical pcr screen     Status: None   Collection Time: 01/01/18  2:08 PM  Result Value Ref Range Status   MRSA, PCR NEGATIVE NEGATIVE Final   Staphylococcus aureus NEGATIVE NEGATIVE Final    Comment: (NOTE) The Xpert SA Assay (FDA approved for NASAL specimens in patients 27 years of age and older), is one component of a comprehensive surveillance program. It is not intended to diagnose infection nor to guide or monitor treatment.   CSF culture     Status: None (Preliminary result)   Collection Time: 01/01/18  7:37 PM  Result Value Ref Range Status   Specimen Description CSF  Final   Special Requests SHUNT  Final   Gram Stain   Final    WBC PRESENT, PREDOMINANTLY MONONUCLEAR NO ORGANISMS SEEN CYTOSPIN SMEAR    Culture NO GROWTH 2 DAYS  Final   Report Status PENDING  Incomplete  Aerobic/Anaerobic Culture (surgical/deep wound)     Status: None (Preliminary result)   Collection Time: 01/01/18  7:39 PM  Result Value Ref Range Status   Specimen Description WOUND  Final   Special Requests VP SHUNT FROM BRAIN  Final   Gram Stain   Final    ABUNDANT WBC PRESENT,BOTH PMN AND MONONUCLEAR NO ORGANISMS SEEN    Culture   Final    FEW HAEMOPHILUS INFLUENZAE BETA LACTAMASE NEGATIVE NO ANAEROBES ISOLATED; CULTURE IN PROGRESS FOR 5 DAYS    Report Status PENDING  Incomplete    Justin AstersJohn Analleli Gierke, MD [facility] [phone] pager   [phone] cell 01/04/2018, 12:13 PM

## 2018-01-04 NOTE — Progress Notes (Signed)
Pharmacy Antibiotic Note  Justin Ball is a 27 y.o. male admitted on 12/30/2017. Pharmacy has been consulted for vancomycin dosing for abdominal wall cellulitis + VP shunt infection. Pt is now afebrile and WBC is down to 14.7. VPS removed and EVD placed 1.8. A vancomycin trough today is elevated at 30.   Plan: Change vancomycin to 1g IV Q8H starting tomorrow F/u renal fxn, C&S, clinical status and trough at SS  Height: 6\' 1"  (185.4 cm) Weight: 173 lb 11.6 oz (78.8 kg) IBW/kg (Calculated) : 79.9  Temp (24hrs), Avg:98.2 F (36.8 C), Min:97.6 F (36.4 C), Max:98.7 F (37.1 C)  Recent Labs  Lab 12/30/17 1848 12/30/17 2042 12/31/17 0439 01/01/18 0842 01/02/18 0748 01/03/18 1629 01/04/18 0513 01/04/18 0943 01/04/18 0956  WBC  --   --  29.5* 42.7* 40.1* 21.2* 14.7*  --   --   CREATININE  --   --  1.21 1.02 0.86 0.91  --   --  0.99  LATICACIDVEN 1.54 1.72  --   --   --   --   --   --   --   VANCOTROUGH  --   --   --   --  13*  --   --  30*  --     Estimated Creatinine Clearance: 126 mL/min (by C-G formula based on SCr of 0.99 mg/dL).    Allergies  Allergen Reactions  . Acyclovir And Related     Antimicrobials this admission: Bactrim PTA >> Vanc 1/7 >> CTX 1/7 >> 1/7 Ceftaz 1/7 >> 1/8  Dose adjustments this admission: 1/9 VT = 13 on 1gm Q8H>>changed to 1250mg  IV Q8H 1/11 VT = 30 on 1250mg  Q8H>>changed back to 1gm Q8H  Microbiology results: 1/6 BCx >> 2/2 CoNS 1/7 UCx >> 40K enterococcus 1/7 MRSA PCR >> negative 1/8 CSF - NGTD  Thank you for allowing pharmacy to be a part of this patient's care.  Ether Goebel, Drake LeachRachel Lynn 01/04/2018 1:50 PM

## 2018-01-04 NOTE — Progress Notes (Signed)
3 Days Post-Op   Subjective/Chief Complaint: No BM yet Felt nauseated and bloated after having NG clamped, so placed back to suction Patient is still drinking clears - had 360 ml recorded intake and 1050 ml NG output, so probably net 690 ml NG output. No abdominal pain   Objective: Vital signs in last 24 hours: Temp:  [97.6 F (36.4 C)-98.2 F (36.8 C)] 98 F (36.7 C) (01/11 0300) Pulse Rate:  [55-89] 87 (01/11 0700) Resp:  [15-21] 21 (01/11 0700) BP: (108-130)/(75-110) 119/78 (01/11 0700) SpO2:  [94 %-99 %] 96 % (01/11 0700) Last BM Date: (unknown, none in last 24 hr)  Intake/Output from previous day: 01/10 0701 - 01/11 0700 In: 2630 [P.O.:360; I.V.:1400; IV Piggyback:870] Out: 2899 [Urine:1725; Emesis/NG output:1050; Drains:124] Intake/Output this shift: No intake/output data recorded.  General appearance: alert, cooperative and no distress GI: soft, hypoactive bowel sounds, non-distended; non-tender No palpable masses  Lab Results:  Recent Labs    01/03/18 1629 01/04/18 0513  WBC 21.2* 14.7*  HGB 10.4* 10.4*  HCT 32.2* 31.9*  PLT 363 355   BMET Recent Labs    01/02/18 0748 01/03/18 1629  NA 140 143  K 3.4* 3.1*  CL 107 109  CO2 24 25  GLUCOSE 104* 93  BUN 6 17  CREATININE 0.86 0.91  CALCIUM 8.3* 8.1*   PT/INR No results for input(s): LABPROT, INR in the last 72 hours. ABG No results for input(s): PHART, HCO3 in the last 72 hours.  Invalid input(s): PCO2, PO2  Studies/Results: No results found.  Anti-infectives: Anti-infectives (From admission, onward)   Start     Dose/Rate Route Frequency Ordered Stop   01/03/18 1000  fluconazole (DIFLUCAN) IVPB 200 mg     200 mg 100 mL/hr over 60 Minutes Intravenous Every 24 hours 01/02/18 1525     01/02/18 1100  vancomycin (VANCOCIN) 1,250 mg in sodium chloride 0.9 % 250 mL IVPB     1,250 mg 166.7 mL/hr over 90 Minutes Intravenous Every 8 hours 01/02/18 1025     01/02/18 0900   sulfamethoxazole-trimethoprim (BACTRIM DS,SEPTRA DS) 800-160 MG per tablet 1 tablet     1 tablet Oral Once per day on Mon Wed Fri 12/31/17 1003     01/01/18 0930  fluconazole (DIFLUCAN) IVPB 400 mg  Status:  Discontinued     400 mg 100 mL/hr over 120 Minutes Intravenous Every 24 hours 01/01/18 0830 01/02/18 1525   12/31/17 2200  vancomycin (VANCOCIN) IVPB 1000 mg/200 mL premix  Status:  Discontinued     1,000 mg 200 mL/hr over 60 Minutes Intravenous Every 8 hours 12/31/17 2048 01/02/18 1025   12/31/17 1400  cefTAZidime (FORTAZ) 2 g in dextrose 5 % 50 mL IVPB  Status:  Discontinued     2 g 100 mL/hr over 30 Minutes Intravenous Every 8 hours 12/31/17 1119 01/01/18 0808   12/31/17 1200  dolutegravir (TIVICAY) tablet 50 mg     50 mg Oral Daily 12/31/17 0207     12/31/17 1200  emtricitabine-rilpivir-tenofovir AF (ODEFSEY) 200-25-25 MG per tablet 1 tablet     1 tablet Oral Daily with breakfast 12/31/17 0207     12/31/17 1100  fluconazole (DIFLUCAN) tablet 400 mg  Status:  Discontinued     400 mg Oral Daily 12/31/17 1003 01/01/18 0829   12/31/17 1000  vancomycin (VANCOCIN) IVPB 1000 mg/200 mL premix  Status:  Discontinued     1,000 mg 200 mL/hr over 60 Minutes Intravenous 2 times daily 12/31/17 0444 12/31/17 2048  12/31/17 0215  sulfamethoxazole-trimethoprim (BACTRIM DS,SEPTRA DS) 800-160 MG per tablet 1 tablet  Status:  Discontinued     1 tablet Oral 2 times daily 12/31/17 0207 12/31/17 1003   12/31/17 0215  cefTRIAXone (ROCEPHIN) 2 g in dextrose 5 % 50 mL IVPB  Status:  Discontinued     2 g 100 mL/hr over 30 Minutes Intravenous Every 24 hours 12/31/17 0207 12/31/17 1119   12/30/17 2345  vancomycin (VANCOCIN) 1,500 mg in sodium chloride 0.9 % 500 mL IVPB     1,500 mg 250 mL/hr over 120 Minutes Intravenous  Once 12/30/17 2331 12/31/17 0324   12/30/17 2330  cefTRIAXone (ROCEPHIN) 2 g in dextrose 5 % 50 mL IVPB     2 g 100 mL/hr over 30 Minutes Intravenous  Once 12/30/17 2328 12/31/17 0122       Assessment/Plan: Ileus secondary to infected VP shunt s/p removal of VPS Clamp NG tube Sips of clear liquids  Replace to suction if patient becomes nauseated. Abdominal film today    LOS: 4 days    Wynona LunaMatthew K Marisella Puccio 01/04/2018

## 2018-01-04 NOTE — Progress Notes (Signed)
No issues overnight.   EXAM:  BP 119/78   Pulse 87   Temp 98.7 F (37.1 C) (Axillary)   Resp (!) 21   Ht 6\' 1"  (1.854 m)   Wt 78.8 kg (173 lb 11.6 oz)   SpO2 96%   BMI 22.92 kg/m   Awake, alert Speech fluent CN grossly intact  5/5 BUE/BLE EVD in place, clear CSF 120cc x 24hrs  CSF Culture 1/8 neg x2d Shunt catheter culture (+) H. flu  IMPRESSION:  27 y.o. male s/p VPS removal for infection. EVD in place, csf cultures clear  PLAN: - Cont CSF drainage for now, may challenge EVD this weekend - IV abx - mgmt ileus per GS

## 2018-01-04 NOTE — Progress Notes (Signed)
[name] NG tube was clamped at 0900.  Pt hollering in pain at 1045.  Pt states that it is his stomach.  NG tube unclamped and back to LWS.  2mg  Morphine given.  Pt now resting comfortably.  Will continue to monitor pt.

## 2018-01-04 NOTE — Progress Notes (Signed)
PROGRESS NOTE    Justin Ball  YNW:[id number] DOB: 12/19/1991 DOA: 12/30/2017 PCP: No primary care provider on file.    Brief Narrative: Patient is a 27 y.o. 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  He was placed on bactrim which he has taken 3 doses.  The rash got worse and was tracking along where his shunt was placed (under his skin along his chest wall). Patient was admitted with sepsis, and VP shunt infection. He is on IV antibiotics. He underwent VP shunt removal and placement of EVD on 1-08 by neurosurgery. He also developed ileus secondary to VP shunt infection. General surgery has been following patient.    Leukocytosis has resolved significantly. Ileus persists. Repeat Abdominal KUB reviewed. No bowel movement.  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  Presents with fever, leukocytosis, tachycardia, tachypnea.  Neurosurgery consulted. Patient underwent  VP shunt removal, placement of EVD on 1-08 Continue with IV fluids.  Follow urine culture grew enterococcus. , blood culture. Growing staph, methicillin resistant. Ceftazidime discontinue.  Continue with Vancomycin. ID helping with IV antibiotics.   2-Abdominal Pain; Nausea, vomiting; Ileus.  KUB with sign of SBO vs ileus.  Concern with peritonitis, general sx was consulted. Continue with IV antibiotics.  General surgery following.  Optimize electrolytes.   AIDS: Continue with [place], [place].  ID  consulted. On Fluconazole for history of cryptococcus Meningitis.   Seizure: Continue with Keppra. Change to IV due to NPO.   Hypokalemia: IV Kcl. Continue to monitor.   S/P VP Shunt. Neurosurgery has been consulted due to infection.  He had VP shunt for cryptococcus meningitis.   Chronic viral hepatitis. Follow by ID.   Bipolar; on Seroquel.     DVT prophylaxis: SCD Code Status: Full code.  Family Communication: mother at bedside 01-04-18.  Disposition Plan: remain inpatient.     Consultants:   Neurosurgery   ID   Procedures:   none   Antimicrobials:   Vancomycin 1-6  Ceftriaxone 1-06--107---Ceftazidime 1-07---1-08  Bactrim 1-06. PCP prophylaxis.    Subjective: He wants to eat. He report improvement of abdominal pain. He has not pass gas/    Objective: Vitals:   01/04/18 1500 01/04/18 1600 01/04/18 1700 01/04/18 1800  BP: 112/78 100/65 121/80 113/79  Pulse: 92 88 90 93  Resp: 20 (!) 21 20 (!) 22  Temp:  98.1 F (36.7 C)    TempSrc:  Oral    SpO2: 97% 95% 95% 96%  Weight:      Height:        Intake/Output Summary (Last 24 hours) at 01/04/2018 1845 Last data filed at 01/04/2018 1800 Gross per 24 hour  Intake 4783.33 ml  Output 5809 ml  Net -1025.67 ml   Filed Weights   12/30/17 1808 12/31/17 0758  Weight: 81.6 kg (180 lb) 78.8 kg (173 lb 11.6 oz)    Examination:  General exam: Alert in no distress.  HEENT; head with EVD in  place.  Respiratory system: CTA Cardiovascular system: S 1, S 2 RRR Gastrointestinal system: BS hypoactive. Central nervous system: Alert and oriented, speech clear, moves all 4 extremities.  Extremities:Symmetric power.    Data Reviewed: I have personally reviewed following labs and imaging studies  CBC: Recent Labs  Lab 12/30/17 1830 12/31/17 0439 01/01/18 0842 01/02/18 0748 01/03/18 1629 01/04/18 0513  WBC 27.7* 29.5* 42.7* 40.1* 21.2* 14.7*  NEUTROABS 23.9*  --   --   --  19.0* 13.7*  HGB 12.4* 12.3*  11.2* 10.4* 10.4* 10.4*  HCT 37.1* 35.7* 32.9* 31.3* 32.2* 31.9*  MCV 95.1 94.7 94.8 95.7 94.7 96.4  PLT 403* 360 375 331 363 355   Basic Metabolic Panel: Recent Labs  Lab 12/31/17 0439 01/01/18 0842 01/02/18 0748 01/03/18 1629 01/04/18 0956  NA 135 136 140 143 142  K 2.8* 3.2* 3.4* 3.1* 3.2*  CL 105 104 107 109 109  CO2 20* 22 24 25 26   GLUCOSE 98 117* 104* 93 94  BUN 7 5* 6 17 11   CREATININE 1.21 1.02 0.86 0.91 0.99  CALCIUM 8.1* 8.5* 8.3* 8.1* 8.1*  MG  --   --   --  1.9  --   PHOS  --   --   --  2.9 2.6   GFR: Estimated Creatinine Clearance: 126 mL/min (by C-G formula based on SCr of 0.99 mg/dL). Liver Function Tests: Recent Labs  Lab 12/30/17 1830 01/03/18 1629 01/04/18 0956  AST 44*  --   --   ALT 35  --   --   ALKPHOS 96  --   --   BILITOT 1.8*  --   --   PROT 9.0*  --   --   ALBUMIN 3.1* 2.1* 2.2*   No results for input(s): LIPASE, AMYLASE in the last 168 hours. No results for input(s): AMMONIA in the last 168 hours. Coagulation Profile: Recent Labs  Lab 12/30/17 1830  INR 1.14   Cardiac Enzymes: No results for input(s): CKTOTAL, CKMB, CKMBINDEX, TROPONINI in the last 168 hours. BNP (last 3 results) No results for input(s): PROBNP in the last 8760 hours. HbA1C: No results for input(s): HGBA1C in the last 72 hours. CBG: No results for input(s): GLUCAP in the last 168 hours. Lipid Profile: No results for input(s): CHOL, HDL, LDLCALC, TRIG, CHOLHDL, LDLDIRECT in the last 72 hours. Thyroid Function Tests: No results for input(s): TSH, T4TOTAL, FREET4, T3FREE, THYROIDAB in the last 72 hours. Anemia Panel: No results for input(s): VITAMINB12, FOLATE, FERRITIN, TIBC, IRON, RETICCTPCT in the last 72 hours. Sepsis Labs: Recent Labs  Lab 12/30/17 1848 12/30/17 2042  LATICACIDVEN 1.54 1.72    Recent Results (from the past 240 hour(s))  Culture, blood (Routine x 2)     Status: Abnormal   Collection Time: 12/30/17  6:30 PM  Result Value Ref Range  Status   Specimen Description BLOOD RIGHT ANTECUBITAL  Final   Special Requests   Final    BOTTLES DRAWN AEROBIC AND ANAEROBIC Blood Culture adequate volume   Culture  Setup Time   Final    GRAM POSITIVE COCCI ANAEROBIC BOTTLE ONLY CRITICAL VALUE NOTED.  VALUE IS CONSISTENT WITH PREVIOUSLY REPORTED AND CALLED VALUE.    Culture STAPHYLOCOCCUS EPIDERMIDIS (A)  Final   Report Status 01/04/2018 FINAL  Final   Organism ID, Bacteria STAPHYLOCOCCUS EPIDERMIDIS  Final      Susceptibility   Staphylococcus epidermidis - MIC*    CIPROFLOXACIN <=0.5 SENSITIVE Sensitive     ERYTHROMYCIN 0.5  SENSITIVE Sensitive     GENTAMICIN <=0.5 SENSITIVE Sensitive     OXACILLIN <=0.25 SENSITIVE Sensitive     TETRACYCLINE <=1 SENSITIVE Sensitive     VANCOMYCIN <=0.5 SENSITIVE Sensitive     TRIMETH/SULFA <=10 SENSITIVE Sensitive     CLINDAMYCIN <=0.25 SENSITIVE Sensitive     RIFAMPIN <=0.5 SENSITIVE Sensitive     Inducible Clindamycin NEGATIVE Sensitive     * STAPHYLOCOCCUS EPIDERMIDIS  Culture, blood (Routine x 2)     Status: Abnormal   Collection Time: 12/30/17  6:40 PM  Result Value Ref Range Status   Specimen Description BLOOD LEFT ANTECUBITAL  Final   Special Requests   Final    BOTTLES DRAWN AEROBIC AND ANAEROBIC Blood Culture adequate volume   Culture  Setup Time   Final    GRAM POSITIVE COCCI IN CLUSTERS IN BOTH AEROBIC AND ANAEROBIC BOTTLES CRITICAL RESULT CALLED TO, READ BACK BY AND VERIFIED WITH: A. MYER PHARMD, AT 2021 12/31/17 BY D. VANHOOK    Culture (A)  Final    STAPHYLOCOCCUS EPIDERMIDIS SUSCEPTIBILITIES PERFORMED ON PREVIOUS CULTURE WITHIN THE LAST 5 DAYS.    Report Status 01/04/2018 FINAL  Final  Blood Culture ID Panel (Reflexed)     Status: Abnormal   Collection Time: 12/30/17  6:40 PM  Result Value Ref Range Status   Enterococcus species NOT DETECTED NOT DETECTED Final   Listeria monocytogenes NOT DETECTED NOT DETECTED Final   Staphylococcus species DETECTED (A) NOT DETECTED Final     Comment: Methicillin (oxacillin) resistant coagulase negative staphylococcus. Possible blood culture contaminant (unless isolated from more than one blood culture draw or clinical case suggests pathogenicity). No antibiotic treatment is indicated for blood  culture contaminants. CRITICAL RESULT CALLED TO, READ BACK BY AND VERIFIED WITH: A. MYER PHARMD, AT 2021 12/31/17 BY D. VANHOOK    Staphylococcus aureus NOT DETECTED NOT DETECTED Final   Methicillin resistance DETECTED (A) NOT DETECTED Final    Comment: CRITICAL RESULT CALLED TO, READ BACK BY AND VERIFIED WITH: A. MYER PHARMD, AT 2021 12/31/17 BY D. VANHOOK    Streptococcus species NOT DETECTED NOT DETECTED Final   Streptococcus agalactiae NOT DETECTED NOT DETECTED Final   Streptococcus pneumoniae NOT DETECTED NOT DETECTED Final   Streptococcus pyogenes NOT DETECTED NOT DETECTED Final   Acinetobacter baumannii NOT DETECTED NOT DETECTED Final   Enterobacteriaceae species NOT DETECTED NOT DETECTED Final   Enterobacter cloacae complex NOT DETECTED NOT DETECTED Final   Escherichia coli NOT DETECTED NOT DETECTED Final   Klebsiella oxytoca NOT DETECTED NOT DETECTED Final   Klebsiella pneumoniae NOT DETECTED NOT DETECTED Final   Proteus species NOT DETECTED NOT DETECTED Final   Serratia marcescens NOT DETECTED NOT DETECTED Final   Haemophilus influenzae NOT DETECTED NOT DETECTED Final   Neisseria meningitidis NOT DETECTED NOT DETECTED Final   Pseudomonas aeruginosa NOT DETECTED NOT DETECTED Final   Candida albicans NOT DETECTED NOT DETECTED Final   Candida glabrata NOT DETECTED NOT DETECTED Final   Candida krusei NOT DETECTED NOT DETECTED Final   Candida parapsilosis NOT DETECTED NOT DETECTED Final   Candida tropicalis NOT DETECTED NOT DETECTED Final  MRSA PCR Screening     Status: None   Collection Time: 12/31/17  8:38 AM  Result Value Ref Range Status   MRSA by PCR NEGATIVE NEGATIVE Final    Comment:        The GeneXpert MRSA Assay  (FDA approved for NASAL specimens only), is one component of a comprehensive MRSA colonization surveillance program. It  is not intended to diagnose MRSA infection nor to guide or monitor treatment for MRSA infections.   Urine Culture     Status: Abnormal   Collection Time: 12/31/17 10:54 AM  Result Value Ref Range Status   Specimen Description URINE, CLEAN CATCH  Final   Special Requests NONE  Final   Culture 40,000 COLONIES/mL ENTEROCOCCUS FAECALIS (A)  Final   Report Status 01/02/2018 FINAL  Final   Organism ID, Bacteria ENTEROCOCCUS FAECALIS (A)  Final      Susceptibility   Enterococcus faecalis - MIC*    AMPICILLIN <=2 SENSITIVE Sensitive     LEVOFLOXACIN 1 SENSITIVE Sensitive     NITROFURANTOIN <=16 SENSITIVE Sensitive     VANCOMYCIN 1 SENSITIVE Sensitive     * 40,000 COLONIES/mL ENTEROCOCCUS FAECALIS  Surgical pcr screen     Status: None   Collection Time: 01/01/18  2:08 PM  Result Value Ref Range Status   MRSA, PCR NEGATIVE NEGATIVE Final   Staphylococcus aureus NEGATIVE NEGATIVE Final    Comment: (NOTE) The Xpert SA Assay (FDA approved for NASAL specimens in patients 38 years of age and older), is one component of a comprehensive surveillance program. It is not intended to diagnose infection nor to guide or monitor treatment.   CSF culture     Status: None (Preliminary result)   Collection Time: 01/01/18  7:37 PM  Result Value Ref Range Status   Specimen Description CSF  Final   Special Requests SHUNT  Final   Gram Stain   Final    WBC PRESENT, PREDOMINANTLY MONONUCLEAR NO ORGANISMS SEEN CYTOSPIN SMEAR    Culture NO GROWTH 2 DAYS  Final   Report Status PENDING  Incomplete  Aerobic/Anaerobic Culture (surgical/deep wound)     Status: None (Preliminary result)   Collection Time: 01/01/18  7:39 PM  Result Value Ref Range Status   Specimen Description WOUND  Final   Special Requests VP SHUNT FROM BRAIN  Final   Gram Stain   Final    ABUNDANT WBC PRESENT,BOTH  PMN AND MONONUCLEAR NO ORGANISMS SEEN    Culture   Final    FEW HAEMOPHILUS INFLUENZAE BETA LACTAMASE NEGATIVE NO ANAEROBES ISOLATED; CULTURE IN PROGRESS FOR 5 DAYS    Report Status PENDING  Incomplete         Radiology Studies: Dg Abd Portable 1v  Result Date: 01/04/2018 CLINICAL DATA:  Followup bowel distension/ileus EXAM: PORTABLE ABDOMEN - 1 VIEW COMPARISON:  01/02/2018 FINDINGS: An NG tube is identified with tip overlying the mid stomach. Distended small bowel loops are again noted, may be minimally increased in caliber. Stool and gas in the rectum are present. No new abnormalities are identified. IMPRESSION: Distended small bowel loops, may be slightly increased in caliber. Electronically Signed   By: Harmon Pier M.D.   On: 01/04/2018 09:32        Scheduled Meds: . dolutegravir  50 mg Oral Daily  . emtricitabine-rilpivir-tenofovir AF  1 tablet Oral Q breakfast  . fluconazole  200 mg Oral Daily  . mouth rinse  15 mL Mouth Rinse BID  . sulfamethoxazole-trimethoprim  1 tablet Oral Once per day on Mon Wed Fri   Continuous Infusions: . sodium chloride 100 mL/hr at 01/04/18 1800  . lactated ringers 10 mL/hr at 01/04/18 1300  . levETIRAcetam Stopped (01/04/18 1137)  . [START ON 01/05/2018] vancomycin       LOS: 4 days    Time spent: 35 minutes.     Barnetta Chapel, MD  [facility] Pager [phone]  If 7PM-7AM, please contact night-coverage [email or web address] Password [facility] 01/04/2018, 6:45 PM

## 2018-01-04 NOTE — Progress Notes (Signed)
CRITICAL VALUE ALERT  Critical Value:  Vanc level 30.  Date & Time Notied:  01/04/18 1127  Provider Notified: Pharmacy notified   Orders Received/Actions taken: Vanc dosing being addressed.

## 2018-01-04 NOTE — Progress Notes (Signed)
Pt drinking clear liquids while NG tube is connected to LWIS. He is not complaining of abdominal pain. Dr. Corliss Skainssuei made aware and said this was OK as long as oral intake and NG output are recorded.

## 2018-01-05 LAB — RENAL FUNCTION PANEL
Albumin: 2.3 g/dL — ABNORMAL LOW (ref 3.5–5.0)
Anion gap: 9 (ref 5–15)
BUN: 6 mg/dL (ref 6–20)
CO2: 24 mmol/L (ref 22–32)
Calcium: 8.2 mg/dL — ABNORMAL LOW (ref 8.9–10.3)
Chloride: 108 mmol/L (ref 101–111)
Creatinine, Ser: 0.94 mg/dL (ref 0.61–1.24)
GFR calc Af Amer: >60 mL/min (ref >60)
GFR calc non Af Amer: >60 mL/min (ref >60)
Glucose, Bld: 87 mg/dL (ref 65–99)
Phosphorus: 3.6 mg/dL (ref 2.5–4.6)
Potassium: 3 mmol/L — ABNORMAL LOW (ref 3.5–5.1)
Sodium: 141 mmol/L (ref 135–145)

## 2018-01-05 LAB — CBC WITH DIFFERENTIAL/PLATELET
Band Neutrophils: 1 %
Basophils Absolute: 0 10*3/uL (ref 0.0–0.1)
Basophils Relative: 0 %
Blasts: 0 %
Eosinophils Absolute: 0.2 10*3/uL (ref 0.0–0.7)
Eosinophils Relative: 1 %
HCT: 33.7 % — ABNORMAL LOW (ref 39.0–52.0)
Hemoglobin: 10.9 g/dL — ABNORMAL LOW (ref 13.0–17.0)
Lymphocytes Relative: 10 %
Lymphs Abs: 2.2 10*3/uL (ref 0.7–4.0)
MCH: 31.2 pg (ref 26.0–34.0)
MCHC: 32.3 g/dL (ref 30.0–36.0)
MCV: 96.6 fL (ref 78.0–100.0)
Metamyelocytes Relative: 5 %
Monocytes Absolute: 1.3 10*3/uL — ABNORMAL HIGH (ref 0.1–1.0)
Monocytes Relative: 6 %
Myelocytes: 3 %
Neutro Abs: 18.6 10*3/uL — ABNORMAL HIGH (ref 1.7–7.7)
Neutrophils Relative %: 71 %
Other: 0 %
Platelets: 400 10*3/uL (ref 150–400)
Promyelocytes Absolute: 3 %
RBC: 3.49 MIL/uL — ABNORMAL LOW (ref 4.22–5.81)
RDW: 14.8 % (ref 11.5–15.5)
WBC: 22.3 10*3/uL — ABNORMAL HIGH (ref 4.0–10.5)
nRBC: 3 /100 WBC — ABNORMAL HIGH

## 2018-01-05 MED ORDER — POTASSIUM CHLORIDE 10 MEQ/100ML IV SOLN
10.0000 meq | INTRAVENOUS | Status: AC
  Administered 2018-01-05 – 2018-01-06 (×6): 10 meq via INTRAVENOUS
  Filled 2018-01-05 (×3): qty 100

## 2018-01-05 MED ORDER — METOCLOPRAMIDE HCL 5 MG/ML IJ SOLN
10.0000 mg | Freq: Four times a day (QID) | INTRAMUSCULAR | Status: DC
  Administered 2018-01-05 – 2018-01-17 (×47): 10 mg via INTRAVENOUS
  Filled 2018-01-05 (×47): qty 2

## 2018-01-05 MED ORDER — MAGNESIUM SULFATE IN D5W 1-5 GM/100ML-% IV SOLN
1.0000 g | Freq: Once | INTRAVENOUS | Status: AC
  Administered 2018-01-05: 1 g via INTRAVENOUS
  Filled 2018-01-05: qty 100

## 2018-01-05 NOTE — Progress Notes (Signed)
No issues overnight. No complaints this am  EXAM:  BP 114/77   Pulse 86   Temp 99.1 F (37.3 C) (Oral)   Resp 20   Ht 6\' 1"  (1.854 m)   Wt 78.8 kg (173 lb 11.6 oz)   SpO2 94%   BMI 22.92 kg/m   Awake, alert, oriented  Speech fluent, appropriate  CN grossly intact  5/5 BUE/BLE  EVD in place, ~66cc overnight  CSF culture negative x2d  IMPRESSION:  27 y.o. male with VPS infection s/p removal. Doing well on abx  PLAN: - Cont EVD, raised to 20mmHg today - Cont IV abx - mgmt ileus per GS

## 2018-01-05 NOTE — Progress Notes (Signed)
PROGRESS NOTE    Justin Ball  WUJ:[id number] DOB: [birth year] DOA: 12/30/2017 PCP: No primary care provider on file.    Brief Narrative: Patient is a 27 y.o. 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  He was initially placed on bactrim. The rash got worse and was tracking along the VP shunt (under his skin along his chest wall). Patient was admitted with sepsis, and VP shunt infection. He is on IV antibiotics. He underwent VP shunt removal and placement of EVD on 1-08 by neurosurgery. He also developed ileus secondary to VP shunt infection. General surgery has been following patient.    Leukocytosis persists. Patient tells me that he broke the wind earlier today but the NG Tube to low suction is still draining significantly.  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  Presents with fever, leukocytosis, tachycardia, tachypnea.  Neurosurgery consulted. Patient underwent  VP shunt removal, placement of EVD on 1-08 Continue with IV fluids.  Follow urine culture grew enterococcus. , blood culture. Growing staph, methicillin resistant. Ceftazidime discontinue.  Continue with Vancomycin. ID helping with IV antibiotics.   2-Abdominal Pain; Nausea, vomiting; Ileus.  KUB with sign of SBO vs ileus.  Concern with peritonitis, general sx was consulted. Continue with IV antibiotics.  General surgery following.  Optimize electrolytes.   AIDS: Continue with Tivicay, Odefsey.  ID  consulted. On Fluconazole for history of cryptococcus Meningitis.   Seizure: Continue with Keppra. Change to IV due to NPO.   Abnormal electrolytes - Continue to replete. Continue to monitor.   S/P VP Shunt. Neurosurgery has been consulted due to infection.  He had VP shunt for cryptococcus meningitis.   Chronic viral hepatitis. Follow by ID.   Bipolar; on Seroquel.     DVT prophylaxis: SCD Code Status: Full code.  Family Communication: mother at bedside 01-04-18.  Disposition Plan: remain inpatient.     Consultants:   Neurosurgery   ID   Procedures:   none   Antimicrobials:   Vancomycin 1-6  Ceftriaxone 1-06--107---Ceftazidime 1-07---1-08  Bactrim 1-06. PCP prophylaxis.    Subjective: Patient has no new complaints. No BM, but patient reported having broken wind earlier today. Abdominal distention persists. NG Tube to low suction continues to drain significantly.  Objective: Vitals:   01/05/18 1800 01/05/18 1830 01/05/18 1930 01/05/18 2000  BP: 134/87 123/81 117/77 120/76  Pulse: 99 95 92 83  Resp: (!) 22 (!) 23 (!) 21 20  Temp:      TempSrc:      SpO2: 94% 95% 98% 95%  Weight:      Height:        Intake/Output Summary (Last 24 hours) at 01/05/2018 2027 Last data filed at 01/05/2018 2000 Gross per 24 hour  Intake 3895 ml  Output 4966 ml  Net -1071 ml   Filed Weights   12/30/17 1808 12/31/17 0758  Weight: 81.6 kg (180 lb) 78.8 kg (173 lb 11.6 oz)    Examination:  General exam: Alert in no distress.  HEENT; head with EVD in place.  Respiratory system: CTA Cardiovascular system: S 1, S 2 RRR Gastrointestinal system: BS hypoactive, but becoming more active. Central nervous system: Alert and oriented, speech clear, moves all 4 extremities.  Extremities:Symmetric power.    Data Reviewed: I have personally reviewed following labs and imaging studies  CBC: Recent Labs  Lab 12/30/17 1830  01/01/18 0842 01/02/18 0748 01/03/18 1629  01/04/18 0513 01/05/18 0305  WBC 27.7*   < > 42.7* 40.1* 21.2* 14.7* 22.3*  NEUTROABS 23.9*  --   --   --  19.0* 13.7* 18.6*  HGB 12.4*   < > 11.2* 10.4* 10.4* 10.4* 10.9*  HCT 37.1*   < > 32.9* 31.3* 32.2* 31.9* 33.7*  MCV 95.1   < > 94.8 95.7 94.7 96.4 96.6  PLT 403*   < > 375 331 363 355 400   < > = values in this interval not displayed.   Basic Metabolic Panel: Recent Labs  Lab 01/01/18 0842 01/02/18 0748 01/03/18 1629 01/04/18 0956 01/05/18 0305  NA 136 140 143 142 141  K 3.2* 3.4* 3.1* 3.2* 3.0*  CL 104 107 109 109 108  CO2 22 24 25 26 24   GLUCOSE 117* 104* 93 94 87  BUN 5* 6 17 11 6   CREATININE 1.02 0.86 0.91 0.99 0.94  CALCIUM 8.5* 8.3* 8.1* 8.1* 8.2*  MG  --   --  1.9  --   --   PHOS  --   --  2.9 2.6 3.6   GFR: Estimated Creatinine Clearance: 132.7 mL/min (by C-G formula based on SCr of 0.94 mg/dL). Liver Function Tests: Recent Labs  Lab 12/30/17 1830 01/03/18 1629 01/04/18 0956 01/05/18 0305  AST 44*  --   --   --   ALT 35  --   --   --   ALKPHOS 96  --   --   --   BILITOT 1.8*  --   --   --   PROT 9.0*  --   --   --   ALBUMIN 3.1* 2.1* 2.2* 2.3*   No results for input(s): LIPASE, AMYLASE in the last 168 hours. No results for input(s): AMMONIA in the last 168 hours. Coagulation Profile: Recent Labs  Lab 12/30/17 1830  INR 1.14   Cardiac Enzymes: No results for input(s): CKTOTAL, CKMB, CKMBINDEX, TROPONINI in the last 168 hours. BNP (last 3 results) No results for input(s): PROBNP in the last 8760 hours. HbA1C: No results for input(s): HGBA1C in the last 72 hours. CBG: No results for input(s): GLUCAP in the last 168 hours. Lipid Profile: No results for input(s): CHOL, HDL, LDLCALC, TRIG, CHOLHDL, LDLDIRECT in the last 72 hours. Thyroid Function Tests: No results for input(s): TSH, T4TOTAL, FREET4, T3FREE, THYROIDAB in the last 72 hours. Anemia Panel: No results for input(s): VITAMINB12, FOLATE, FERRITIN, TIBC, IRON, RETICCTPCT in the last  72 hours. Sepsis Labs: Recent Labs  Lab 12/30/17 1848 12/30/17 2042  LATICACIDVEN 1.54 1.72    Recent Results (from the past 240 hour(s))  Culture, blood (Routine x 2)     Status: Abnormal   Collection Time: 12/30/17  6:30 PM  Result Value Ref Range Status   Specimen Description BLOOD RIGHT ANTECUBITAL  Final   Special Requests   Final    BOTTLES DRAWN AEROBIC AND ANAEROBIC Blood Culture adequate volume   Culture  Setup Time   Final    GRAM POSITIVE COCCI ANAEROBIC BOTTLE  ONLY CRITICAL VALUE NOTED.  VALUE IS CONSISTENT WITH PREVIOUSLY REPORTED AND CALLED VALUE.    Culture STAPHYLOCOCCUS EPIDERMIDIS (A)  Final   Report Status 01/04/2018 FINAL  Final   Organism ID, Bacteria STAPHYLOCOCCUS EPIDERMIDIS  Final      Susceptibility   Staphylococcus epidermidis - MIC*    CIPROFLOXACIN <=0.5 SENSITIVE Sensitive     ERYTHROMYCIN 0.5 SENSITIVE Sensitive     GENTAMICIN <=0.5 SENSITIVE Sensitive     OXACILLIN <=0.25 SENSITIVE Sensitive     TETRACYCLINE <=1 SENSITIVE Sensitive     VANCOMYCIN <=0.5 SENSITIVE Sensitive     TRIMETH/SULFA <=10 SENSITIVE Sensitive     CLINDAMYCIN <=0.25 SENSITIVE Sensitive     RIFAMPIN <=0.5 SENSITIVE Sensitive     Inducible Clindamycin NEGATIVE Sensitive     * STAPHYLOCOCCUS EPIDERMIDIS  Culture, blood (Routine x 2)     Status: Abnormal   Collection Time: 12/30/17  6:40 PM  Result Value Ref Range Status   Specimen Description BLOOD LEFT ANTECUBITAL  Final   Special Requests   Final    BOTTLES DRAWN AEROBIC AND ANAEROBIC Blood Culture adequate volume   Culture  Setup Time   Final    GRAM POSITIVE COCCI IN CLUSTERS IN BOTH AEROBIC AND ANAEROBIC BOTTLES CRITICAL RESULT CALLED TO, READ BACK BY AND VERIFIED WITH: A. MYER PHARMD, AT 2021 12/31/17 BY D. VANHOOK    Culture (A)  Final    STAPHYLOCOCCUS EPIDERMIDIS SUSCEPTIBILITIES PERFORMED ON PREVIOUS CULTURE WITHIN THE LAST 5 DAYS.    Report Status 01/04/2018 FINAL  Final  Blood Culture ID Panel (Reflexed)      Status: Abnormal   Collection Time: 12/30/17  6:40 PM  Result Value Ref Range Status   Enterococcus species NOT DETECTED NOT DETECTED Final   Listeria monocytogenes NOT DETECTED NOT DETECTED Final   Staphylococcus species DETECTED (A) NOT DETECTED Final    Comment: Methicillin (oxacillin) resistant coagulase negative staphylococcus. Possible blood culture contaminant (unless isolated from more than one blood culture draw or clinical case suggests pathogenicity). No antibiotic treatment is indicated for blood  culture contaminants. CRITICAL RESULT CALLED TO, READ BACK BY AND VERIFIED WITH: A. MYER PHARMD, AT 2021 12/31/17 BY D. VANHOOK    Staphylococcus aureus NOT DETECTED NOT DETECTED Final   Methicillin resistance DETECTED (A) NOT DETECTED Final    Comment: CRITICAL RESULT CALLED TO, READ BACK BY AND VERIFIED WITH: A. MYER PHARMD, AT 2021 12/31/17 BY D. VANHOOK    Streptococcus species NOT DETECTED NOT DETECTED Final   Streptococcus agalactiae NOT DETECTED NOT DETECTED Final   Streptococcus pneumoniae NOT DETECTED NOT DETECTED Final   Streptococcus pyogenes NOT DETECTED NOT DETECTED Final   Acinetobacter baumannii NOT DETECTED NOT DETECTED Final   Enterobacteriaceae species NOT DETECTED NOT DETECTED Final   Enterobacter cloacae complex NOT DETECTED NOT DETECTED Final   Escherichia coli NOT DETECTED NOT DETECTED Final   Klebsiella oxytoca NOT DETECTED NOT DETECTED Final   Klebsiella pneumoniae NOT DETECTED NOT DETECTED Final   Proteus species NOT DETECTED NOT DETECTED Final   Serratia marcescens NOT DETECTED NOT DETECTED Final   Haemophilus influenzae NOT DETECTED NOT DETECTED Final   Neisseria meningitidis NOT DETECTED NOT DETECTED Final   Pseudomonas aeruginosa NOT DETECTED NOT DETECTED Final   Candida albicans NOT DETECTED NOT DETECTED Final   Candida glabrata NOT DETECTED NOT DETECTED Final   Candida krusei NOT DETECTED NOT DETECTED Final   Candida parapsilosis NOT DETECTED NOT  DETECTED Final   Candida tropicalis NOT DETECTED NOT DETECTED  Final  MRSA PCR Screening     Status: None   Collection Time: 12/31/17  8:38 AM  Result Value Ref Range Status   MRSA by PCR NEGATIVE NEGATIVE Final    Comment:        The GeneXpert MRSA Assay (FDA approved for NASAL specimens only), is one component of a comprehensive MRSA colonization surveillance program. It is not intended to diagnose MRSA infection nor to guide or monitor treatment for MRSA infections.   Urine Culture     Status: Abnormal   Collection Time: 12/31/17 10:54 AM  Result Value Ref Range Status   Specimen Description URINE, CLEAN CATCH  Final   Special Requests NONE  Final   Culture 40,000 COLONIES/mL ENTEROCOCCUS FAECALIS (A)  Final   Report Status 01/02/2018 FINAL  Final   Organism ID, Bacteria ENTEROCOCCUS FAECALIS (A)  Final      Susceptibility   Enterococcus faecalis - MIC*    AMPICILLIN <=2 SENSITIVE Sensitive     LEVOFLOXACIN 1 SENSITIVE Sensitive     NITROFURANTOIN <=16 SENSITIVE Sensitive     VANCOMYCIN 1 SENSITIVE Sensitive     * 40,000 COLONIES/mL ENTEROCOCCUS FAECALIS  Surgical pcr screen     Status: None   Collection Time: 01/01/18  2:08 PM  Result Value Ref Range Status   MRSA, PCR NEGATIVE NEGATIVE Final   Staphylococcus aureus NEGATIVE NEGATIVE Final    Comment: (NOTE) The Xpert SA Assay (FDA approved for NASAL specimens in patients 18 years of age and older), is one component of a comprehensive surveillance program. It is not intended to diagnose infection nor to guide or monitor treatment.   CSF culture     Status: None (Preliminary result)   Collection Time: 01/01/18  7:37 PM  Result Value Ref Range Status   Specimen Description CSF  Final   Special Requests SHUNT  Final   Gram Stain   Final    WBC PRESENT, PREDOMINANTLY MONONUCLEAR NO ORGANISMS SEEN CYTOSPIN SMEAR    Culture NO GROWTH 2 DAYS  Final   Report Status PENDING  Incomplete  Aerobic/Anaerobic Culture  (surgical/deep wound)     Status: None (Preliminary result)   Collection Time: 01/01/18  7:39 PM  Result Value Ref Range Status   Specimen Description WOUND  Final   Special Requests VP SHUNT FROM BRAIN  Final   Gram Stain   Final    ABUNDANT WBC PRESENT,BOTH PMN AND MONONUCLEAR NO ORGANISMS SEEN    Culture   Final    FEW HAEMOPHILUS INFLUENZAE BETA LACTAMASE NEGATIVE NO ANAEROBES ISOLATED; CULTURE IN PROGRESS FOR 5 DAYS CRITICAL RESULT CALLED TO, READ BACK BY AND VERIFIED WITH: A ELLIOTT,RN AT 1623 01/05/18 BY L BENFIELD CONCERNING GROWTH ON CULTURE    Report Status PENDING  Incomplete         Radiology Studies: Dg Abd Portable 1v  Result Date: 01/04/2018 CLINICAL DATA:  Followup bowel distension/ileus EXAM: PORTABLE ABDOMEN - 1 VIEW COMPARISON:  01/02/2018 FINDINGS: An NG tube is identified with tip overlying the mid stomach. Distended small bowel loops are again noted, may be minimally increased in caliber. Stool and gas in the rectum are present. No new abnormalities are identified. IMPRESSION: Distended small bowel loops, may be slightly increased in caliber. Electronically Signed   By: Harmon Pier M.D.   On: 01/04/2018 09:32        Scheduled Meds: . dolutegravir  50 mg Oral Daily  . emtricitabine-rilpivir-tenofovir AF  1 tablet Oral Q breakfast  .  fluconazole  200 mg Oral Daily  . mouth rinse  15 mL Mouth Rinse BID  . metoCLOPramide (REGLAN) injection  10 mg Intravenous Q6H  . sulfamethoxazole-trimethoprim  1 tablet Oral Once per day on Mon Wed Fri   Continuous Infusions: . sodium chloride 100 mL/hr at 01/05/18 1900  . lactated ringers Stopped (01/04/18 2000)  . levETIRAcetam Stopped (01/05/18 0818)  . potassium chloride 10 mEq (01/05/18 1958)  . vancomycin Stopped (01/05/18 1414)     LOS: 5 days    Time spent: 35 minutes.     Barnetta Chapel, MD [facility] Pager [phone]  If 7PM-7AM, please contact night-coverage [email or web address] Password  [facility] 01/05/2018, 8:27 PM

## 2018-01-05 NOTE — Progress Notes (Signed)
4 Days Post-Op   Subjective/Chief Complaint: Patient tolerated NG clamped for a few hours yesterday Reports some flatus; no BM On suction right now Plain films - dilated SB NG - about 1550 ml net output yesterday   Objective: Vital signs in last 24 hours: Temp:  [97.8 F (36.6 C)-99.1 F (37.3 C)] 99.1 F (37.3 C) (01/12 0329) Pulse Rate:  [77-97] 86 (01/12 0700) Resp:  [16-25] 20 (01/12 0700) BP: (100-139)/(65-98) 114/77 (01/12 0700) SpO2:  [93 %-99 %] 94 % (01/12 0700) Last BM Date: (pt states he is unsure - 1/8 per chart)  Intake/Output from previous day: 01/11 0701 - 01/12 0700 In: 5730.3 [P.O.:2367; I.V.:2943.3; IV Piggyback:420] Out: 7312 [Urine:3300; Emesis/NG output:3950; Drains:62] Intake/Output this shift: No intake/output data recorded.  GI: abdomen is still fairly distended; hypoactive bowel sounds; non-tender  Lab Results:  Recent Labs    01/04/18 0513 01/05/18 0305  WBC 14.7* 22.3*  HGB 10.4* 10.9*  HCT 31.9* 33.7*  PLT 355 400   BMET Recent Labs    01/04/18 0956 01/05/18 0305  NA 142 141  K 3.2* 3.0*  CL 109 108  CO2 26 24  GLUCOSE 94 87  BUN 11 6  CREATININE 0.99 0.94  CALCIUM 8.1* 8.2*   PT/INR No results for input(s): LABPROT, INR in the last 72 hours. ABG No results for input(s): PHART, HCO3 in the last 72 hours.  Invalid input(s): PCO2, PO2  Studies/Results: Dg Abd Portable 1v  Result Date: 01/04/2018 CLINICAL DATA:  Followup bowel distension/ileus EXAM: PORTABLE ABDOMEN - 1 VIEW COMPARISON:  01/02/2018 FINDINGS: An NG tube is identified with tip overlying the mid stomach. Distended small bowel loops are again noted, may be minimally increased in caliber. Stool and gas in the rectum are present. No new abnormalities are identified. IMPRESSION: Distended small bowel loops, may be slightly increased in caliber. Electronically Signed   By: Harmon PierJeffrey  Hu M.D.   On: 01/04/2018 09:32    Anti-infectives: Anti-infectives (From admission,  onward)   Start     Dose/Rate Route Frequency Ordered Stop   01/05/18 0500  vancomycin (VANCOCIN) IVPB 1000 mg/200 mL premix     1,000 mg 200 mL/hr over 60 Minutes Intravenous Every 8 hours 01/04/18 1349     01/04/18 1000  fluconazole (DIFLUCAN) tablet 200 mg     200 mg Oral Daily 01/04/18 0844     01/03/18 1000  fluconazole (DIFLUCAN) IVPB 200 mg  Status:  Discontinued     200 mg 100 mL/hr over 60 Minutes Intravenous Every 24 hours 01/02/18 1525 01/04/18 0844   01/02/18 1100  vancomycin (VANCOCIN) 1,250 mg in sodium chloride 0.9 % 250 mL IVPB  Status:  Discontinued     1,250 mg 166.7 mL/hr over 90 Minutes Intravenous Every 8 hours 01/02/18 1025 01/04/18 1137   01/02/18 0900  sulfamethoxazole-trimethoprim (BACTRIM DS,SEPTRA DS) 800-160 MG per tablet 1 tablet     1 tablet Oral Once per day on Mon Wed Fri 12/31/17 1003     01/01/18 0930  fluconazole (DIFLUCAN) IVPB 400 mg  Status:  Discontinued     400 mg 100 mL/hr over 120 Minutes Intravenous Every 24 hours 01/01/18 0830 01/02/18 1525   12/31/17 2200  vancomycin (VANCOCIN) IVPB 1000 mg/200 mL premix  Status:  Discontinued     1,000 mg 200 mL/hr over 60 Minutes Intravenous Every 8 hours 12/31/17 2048 01/02/18 1025   12/31/17 1400  cefTAZidime (FORTAZ) 2 g in dextrose 5 % 50 mL IVPB  Status:  Discontinued     2 g 100 mL/hr over 30 Minutes Intravenous Every 8 hours 12/31/17 1119 01/01/18 0808   12/31/17 1200  dolutegravir (TIVICAY) tablet 50 mg     50 mg Oral Daily 12/31/17 0207     12/31/17 1200  emtricitabine-rilpivir-tenofovir AF (ODEFSEY) 200-25-25 MG per tablet 1 tablet     1 tablet Oral Daily with breakfast 12/31/17 0207     12/31/17 1100  fluconazole (DIFLUCAN) tablet 400 mg  Status:  Discontinued     400 mg Oral Daily 12/31/17 1003 01/01/18 0829   12/31/17 1000  vancomycin (VANCOCIN) IVPB 1000 mg/200 mL premix  Status:  Discontinued     1,000 mg 200 mL/hr over 60 Minutes Intravenous 2 times daily 12/31/17 0444 12/31/17 2048    12/31/17 0215  sulfamethoxazole-trimethoprim (BACTRIM DS,SEPTRA DS) 800-160 MG per tablet 1 tablet  Status:  Discontinued     1 tablet Oral 2 times daily 12/31/17 0207 12/31/17 1003   12/31/17 0215  cefTRIAXone (ROCEPHIN) 2 g in dextrose 5 % 50 mL IVPB  Status:  Discontinued     2 g 100 mL/hr over 30 Minutes Intravenous Every 24 hours 12/31/17 0207 12/31/17 1119   12/30/17 2345  vancomycin (VANCOCIN) 1,500 mg in sodium chloride 0.9 % 500 mL IVPB     1,500 mg 250 mL/hr over 120 Minutes Intravenous  Once 12/30/17 2331 12/31/17 0324   12/30/17 2330  cefTRIAXone (ROCEPHIN) 2 g in dextrose 5 % 50 mL IVPB     2 g 100 mL/hr over 30 Minutes Intravenous  Once 12/30/17 2328 12/31/17 0122      Assessment/Plan: Ileus secondary to infected VP shunt s/p removal of VPS Clamp NG tube Sips of clear liquids Add Reglan 10 mg IV q6  Replace to suction if patient becomes nauseated.    LOS: 5 days    Justin Ball 01/05/2018

## 2018-01-06 LAB — CBC WITH DIFFERENTIAL/PLATELET
Basophils Absolute: 0.3 10*3/uL — ABNORMAL HIGH (ref 0.0–0.1)
Basophils Relative: 1 %
Eosinophils Absolute: 0 10*3/uL (ref 0.0–0.7)
Eosinophils Relative: 0 %
HCT: 31.7 % — ABNORMAL LOW (ref 39.0–52.0)
Hemoglobin: 10.3 g/dL — ABNORMAL LOW (ref 13.0–17.0)
Lymphocytes Relative: 7 %
Lymphs Abs: 1.7 10*3/uL (ref 0.7–4.0)
MCH: 31 pg (ref 26.0–34.0)
MCHC: 32.5 g/dL (ref 30.0–36.0)
MCV: 95.5 fL (ref 78.0–100.0)
Monocytes Absolute: 0.5 10*3/uL (ref 0.1–1.0)
Monocytes Relative: 2 %
Neutro Abs: 22.4 10*3/uL — ABNORMAL HIGH (ref 1.7–7.7)
Neutrophils Relative %: 90 %
Platelets: 432 10*3/uL — ABNORMAL HIGH (ref 150–400)
RBC: 3.32 MIL/uL — ABNORMAL LOW (ref 4.22–5.81)
RDW: 14.6 % (ref 11.5–15.5)
WBC: 24.9 10*3/uL — ABNORMAL HIGH (ref 4.0–10.5)

## 2018-01-06 LAB — RENAL FUNCTION PANEL
Albumin: 2.1 g/dL — ABNORMAL LOW (ref 3.5–5.0)
Albumin: 2.2 g/dL — ABNORMAL LOW (ref 3.5–5.0)
Anion gap: 9 (ref 5–15)
Anion gap: 9 (ref 5–15)
BUN: <5 mg/dL — ABNORMAL LOW (ref 6–20)
BUN: <5 mg/dL — ABNORMAL LOW (ref 6–20)
CO2: 24 mmol/L (ref 22–32)
CO2: 22 mmol/L (ref 22–32)
Calcium: 8.1 mg/dL — ABNORMAL LOW (ref 8.9–10.3)
Calcium: 8.2 mg/dL — ABNORMAL LOW (ref 8.9–10.3)
Chloride: 105 mmol/L (ref 101–111)
Chloride: 105 mmol/L (ref 101–111)
Creatinine, Ser: 0.7 mg/dL (ref 0.61–1.24)
Creatinine, Ser: 0.84 mg/dL (ref 0.61–1.24)
GFR calc Af Amer: >60 mL/min (ref >60)
GFR calc Af Amer: >60 mL/min (ref >60)
GFR calc non Af Amer: >60 mL/min (ref >60)
GFR calc non Af Amer: >60 mL/min (ref >60)
Glucose, Bld: 89 mg/dL (ref 65–99)
Glucose, Bld: 83 mg/dL (ref 65–99)
Phosphorus: 2.7 mg/dL (ref 2.5–4.6)
Phosphorus: 2.8 mg/dL (ref 2.5–4.6)
Potassium: 2.9 mmol/L — ABNORMAL LOW (ref 3.5–5.1)
Potassium: 3.2 mmol/L — ABNORMAL LOW (ref 3.5–5.1)
Sodium: 138 mmol/L (ref 135–145)
Sodium: 136 mmol/L (ref 135–145)

## 2018-01-06 LAB — CSF CULTURE: CULTURE: NO GROWTH

## 2018-01-06 LAB — MAGNESIUM: Magnesium: 2 mg/dL (ref 1.7–2.4)

## 2018-01-06 MED ORDER — POTASSIUM CHLORIDE 10 MEQ/100ML IV SOLN
10.0000 meq | INTRAVENOUS | Status: AC
  Administered 2018-01-06 (×6): 10 meq via INTRAVENOUS
  Filled 2018-01-06 (×6): qty 100

## 2018-01-06 MED ORDER — POTASSIUM CHLORIDE 10 MEQ/100ML IV SOLN
INTRAVENOUS | Status: AC
  Filled 2018-01-06: qty 100

## 2018-01-06 NOTE — Progress Notes (Signed)
5 Days Post-Op   Subjective/Chief Complaint: Emesis yesterday, pain with clamping, no bowel movement or flatus   Objective: Vital signs in last 24 hours: Temp:  [97.7 F (36.5 C)-98.6 F (37 C)] 97.7 F (36.5 C) (01/13 0400) Pulse Rate:  [71-100] 78 (01/13 0700) Resp:  [17-33] 17 (01/13 0700) BP: (106-146)/(69-102) 114/78 (01/13 0700) SpO2:  [91 %-100 %] 97 % (01/13 0700) Last BM Date: 01/01/18  Intake/Output from previous day: 01/12 0701 - 01/13 0700 In: 3042 [P.O.:222; I.V.:2000; IV Piggyback:820] Out: 2154 [Urine:1900; Emesis/NG output:250; Drains:4] Intake/Output this shift: No intake/output data recorded.  GI: abdomen is still fairly distended; hypoactive bowel sounds; non-tender  Lab Results:  Recent Labs    01/05/18 0305 01/06/18 0338  WBC 22.3* 24.9*  HGB 10.9* 10.3*  HCT 33.7* 31.7*  PLT 400 432*   BMET Recent Labs    01/05/18 0305 01/06/18 0338  NA 141 138  K 3.0* 2.9*  CL 108 105  CO2 24 24  GLUCOSE 87 89  BUN 6 <5*  CREATININE 0.94 0.70  CALCIUM 8.2* 8.1*   PT/INR No results for input(s): LABPROT, INR in the last 72 hours. ABG No results for input(s): PHART, HCO3 in the last 72 hours.  Invalid input(s): PCO2, PO2  Studies/Results: Dg Abd Portable 1v  Result Date: 01/04/2018 CLINICAL DATA:  Followup bowel distension/ileus EXAM: PORTABLE ABDOMEN - 1 VIEW COMPARISON:  01/02/2018 FINDINGS: An NG tube is identified with tip overlying the mid stomach. Distended small bowel loops are again noted, may be minimally increased in caliber. Stool and gas in the rectum are present. No new abnormalities are identified. IMPRESSION: Distended small bowel loops, may be slightly increased in caliber. Electronically Signed   By: Harmon PierJeffrey  Hu M.D.   On: 01/04/2018 09:32    Anti-infectives: Anti-infectives (From admission, onward)   Start     Dose/Rate Route Frequency Ordered Stop   01/05/18 0500  vancomycin (VANCOCIN) IVPB 1000 mg/200 mL premix     1,000  mg 200 mL/hr over 60 Minutes Intravenous Every 8 hours 01/04/18 1349     01/04/18 1000  fluconazole (DIFLUCAN) tablet 200 mg     200 mg Oral Daily 01/04/18 0844     01/03/18 1000  fluconazole (DIFLUCAN) IVPB 200 mg  Status:  Discontinued     200 mg 100 mL/hr over 60 Minutes Intravenous Every 24 hours 01/02/18 1525 01/04/18 0844   01/02/18 1100  vancomycin (VANCOCIN) 1,250 mg in sodium chloride 0.9 % 250 mL IVPB  Status:  Discontinued     1,250 mg 166.7 mL/hr over 90 Minutes Intravenous Every 8 hours 01/02/18 1025 01/04/18 1137   01/02/18 0900  sulfamethoxazole-trimethoprim (BACTRIM DS,SEPTRA DS) 800-160 MG per tablet 1 tablet     1 tablet Oral Once per day on Mon Wed Fri 12/31/17 1003     01/01/18 0930  fluconazole (DIFLUCAN) IVPB 400 mg  Status:  Discontinued     400 mg 100 mL/hr over 120 Minutes Intravenous Every 24 hours 01/01/18 0830 01/02/18 1525   12/31/17 2200  vancomycin (VANCOCIN) IVPB 1000 mg/200 mL premix  Status:  Discontinued     1,000 mg 200 mL/hr over 60 Minutes Intravenous Every 8 hours 12/31/17 2048 01/02/18 1025   12/31/17 1400  cefTAZidime (FORTAZ) 2 g in dextrose 5 % 50 mL IVPB  Status:  Discontinued     2 g 100 mL/hr over 30 Minutes Intravenous Every 8 hours 12/31/17 1119 01/01/18 0808   12/31/17 1200  dolutegravir (TIVICAY) tablet 50  mg     50 mg Oral Daily 12/31/17 0207     12/31/17 1200  emtricitabine-rilpivir-tenofovir AF (ODEFSEY) 200-25-25 MG per tablet 1 tablet     1 tablet Oral Daily with breakfast 12/31/17 0207     12/31/17 1100  fluconazole (DIFLUCAN) tablet 400 mg  Status:  Discontinued     400 mg Oral Daily 12/31/17 1003 01/01/18 0829   12/31/17 1000  vancomycin (VANCOCIN) IVPB 1000 mg/200 mL premix  Status:  Discontinued     1,000 mg 200 mL/hr over 60 Minutes Intravenous 2 times daily 12/31/17 0444 12/31/17 2048   12/31/17 0215  sulfamethoxazole-trimethoprim (BACTRIM DS,SEPTRA DS) 800-160 MG per tablet 1 tablet  Status:  Discontinued     1 tablet Oral  2 times daily 12/31/17 0207 12/31/17 1003   12/31/17 0215  cefTRIAXone (ROCEPHIN) 2 g in dextrose 5 % 50 mL IVPB  Status:  Discontinued     2 g 100 mL/hr over 30 Minutes Intravenous Every 24 hours 12/31/17 0207 12/31/17 1119   12/30/17 2345  vancomycin (VANCOCIN) 1,500 mg in sodium chloride 0.9 % 500 mL IVPB     1,500 mg 250 mL/hr over 120 Minutes Intravenous  Once 12/30/17 2331 12/31/17 0324   12/30/17 2330  cefTRIAXone (ROCEPHIN) 2 g in dextrose 5 % 50 mL IVPB     2 g 100 mL/hr over 30 Minutes Intravenous  Once 12/30/17 2328 12/31/17 0122      Assessment/Plan: Ileus secondary to infected VP shunt s/p removal of VPS Hypokalemia- replace, mag is OK Leave NG to continuous suction Reglan 10 mg IV q6  Still no bowel function, still distended, pain with clamping and had some clear emesis yesterday. WBC increasing. Will leave NG to suction today, keep NPO except meds. If continues to worsen recommend CT abdomen/pelvis to assess for drainable abscess.      LOS: 6 days    Berna Bue 01/06/2018

## 2018-01-06 NOTE — Progress Notes (Signed)
No issues overnight. No HA this am, some nausea.  EXAM:  BP (!) 129/94   Pulse 68   Temp 98.1 F (36.7 C) (Oral)   Resp 20   Ht 6\' 1"  (1.854 m)   Wt 78.8 kg (173 lb 11.6 oz)   SpO2 96%   BMI 22.92 kg/m   Awake, alert, oriented  Speech fluent, appropriate  CN grossly intact  5/5 BUE/BLE EVD in place, only ~4cc x 24hrs.   IMPRESSION:  27 y.o. male with VPS infection, No real output of EVD since raising to 20 and exam has been stable. Hopeful he may not be shunt dependent.  PLAN: - Clamp EVD this am, cont to monitor exam. - Will get CT in am, if he doesn't have clear hydrocephalus will likely d/c EVD.

## 2018-01-06 NOTE — Progress Notes (Signed)
PROGRESS NOTE    Justin Ball  ZOX:[id number] DOB: May 12, 1991 DOA: 12/30/2017 PCP: No primary care provider on file.    Brief Narrative: Patient is a 27 y.o. 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 Patient was seen by the PCP several days prior to admission with history of rash along his abd wall where his VP shunt was.  Patient was initially placed on bactrim. The rash got worse and was tracking along the VP shunt location (under his skin along his chest wall). Patient was admitted with sepsis, and VP shunt infection. Patient has undergone VP shunt removal and placement of EVD (on 01/01/18) by Neurosurgery. Patient also developed ileus secondary to VP shunt infection. General surgery has been following patient. Severe Hypokalemia noted (potassium is 2.9 today).  Leukocytosis persists. NG Tube to low suction is still draining significantly. No BM.  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 Presented with fever, leukocytosis, tachycardia, tachypnea.  - Neurosurgery consulted. Patient underwent  VP shunt removal, placement of EVD on 1-08 - Continue with IV antibiotics.  - Urine culture grew enterococcus.  Blood culture grew Staph Epi and Strept Pneumonia.blood culture. CSF culture has not grown any organisms till date. Patient is on IV Vanco, as well as Medication for HIV and Prophylaxis (Bactrim and fluconazole).  2-Abdominal Pain; Nausea, vomiting; Ileus.  KUB with sign of SBO vs ileus.  Concern with peritonitis, general sx was  consulted. Continue with IV antibiotics.  General surgery following.  Optimize electrolytes. NG Tube to low suction.   AIDS: Continue with T[place]ay, [place].  ID consulted. On Fluconazole for history of cryptococcus Meningitis.   Seizure: Continue with Keppra. Change to IV due to NPO.   Abnormal electrolytes - Continue to replete (Potassium was 2.9 today). Repeat renal panel this evening. IV Kcl 60 Meq over 6 hours. Continue to monitor.  Cardiac Monitoring.  S/P VP Shunt. Neurosurgery has been consulted due to infection.  He had VP shunt for cryptococcus meningitis.   Chronic viral hepatitis.  Bipolar; on Seroquel.     DVT prophylaxis: SCD Code Status: Full code.  Family Communication: mother at bedside 01-04-18.  Disposition Plan: remain inpatient.     Consultants:   Neurosurgery   ID   Procedures:   none   Antimicrobials:   Vancomycin 1-6  Ceftriaxone 1-06--107---Ceftazidime 1-07---1-08  Bactrim 1-06. PCP prophylaxis.    Subjective: No BM Significant drainage from NG tube to low suction Still ill looking.  Objective: Vitals:   01/06/18 0830 01/06/18 0900 01/06/18 0930 01/06/18 1000  BP: 127/90 (!) 125/92 133/89 117/78  Pulse: 69 73 87 84  Resp: 17 19 (!) 21 20  Temp:      TempSrc:      SpO2: 97% 100% 95% 96%  Weight:      Height:        Intake/Output Summary (Last 24 hours) at 01/06/2018 1021 Last data filed at 01/06/2018 1000 Gross per 24 hour  Intake 3032 ml  Output 2104 ml  Net 928 ml   [employer]  12/30/17 1808 12/31/17 0758  Weight: 81.6 kg (180 lb) 78.8 kg (173 lb 11.6 oz)    Examination:  General exam: Not in distress.  HEENT; head with EVD in place.  Respiratory system: CTA Cardiovascular system: S 1, S 2 RRR Gastrointestinal system: BS hypoactive, but becoming more active. Central nervous system: Alert and oriented, speech clear, moves all 4 extremities.  Extremities: No edema.    Data Reviewed: I have  personally reviewed following labs and imaging studies  CBC: Recent Labs  Lab 12/30/17 1830  01/02/18 0748 01/03/18 1629 01/04/18 0513 01/05/18 0305 01/06/18 0338  WBC 27.7*   < > 40.1* 21.2* 14.7* 22.3* 24.9*  NEUTROABS 23.9*  --   --  19.0* 13.7* 18.6* 22.4*  HGB 12.4*   < > 10.4* 10.4* 10.4* 10.9* 10.3*  HCT 37.1*   < > 31.3* 32.2* 31.9* 33.7* 31.7*  MCV 95.1   < > 95.7 94.7 96.4 96.6 95.5  PLT 403*   < > 331 363 355 400 432*   < > = values in this interval not displayed.   Basic Metabolic Panel: Recent Labs  Lab 01/02/18 0748 01/03/18 1629 01/04/18 0956 01/05/18 0305 01/06/18 0338  NA 140 143 142 141 138  K 3.4* 3.1* 3.2* 3.0* 2.9*  CL 107 109 109 108 105  CO2 24 25 26 24 24   GLUCOSE 104* 93 94 87 89  BUN 6 17 11 6  <5*  CREATININE 0.86 0.91 0.99 0.94 0.70  CALCIUM 8.3* 8.1* 8.1* 8.2* 8.1*  MG  --  1.9  --   --  2.0  PHOS  --  2.9 2.6 3.6 2.7   GFR: Estimated Creatinine Clearance: 156 mL/min (by C-G formula based on SCr of 0.7 mg/dL). Liver Function Tests: Recent Labs  Lab 12/30/17 1830 01/03/18 1629 01/04/18 0956 01/05/18 0305 01/06/18 0338  AST 44*  --   --   --   --   ALT 35  --   --   --   --   ALKPHOS 96  --   --   --   --   BILITOT 1.8*  --   --   --   --   PROT 9.0*  --   --   --   --   ALBUMIN 3.1* 2.1* 2.2* 2.3* 2.1*   No results for input(s): LIPASE, AMYLASE in the last 168 hours. No results for input(s): AMMONIA in the last 168 hours. Coagulation Profile: Recent Labs  Lab 12/30/17 1830  INR 1.14   Cardiac Enzymes: No results for input(s): CKTOTAL, CKMB, CKMBINDEX, TROPONINI in the last 168 hours. BNP (last 3 results) No results for input(s): PROBNP in the last 8760 hours. HbA1C: No results for input(s): HGBA1C in the last 72 hours. CBG: No results for input(s): GLUCAP in the last 168 hours. Lipid Profile: No results for input(s): CHOL, HDL, LDLCALC, TRIG, CHOLHDL, LDLDIRECT in the last 72 hours. Thyroid Function Tests: No results  for input(s): TSH, T4TOTAL, FREET4, T3FREE, THYROIDAB in the last 72 hours. Anemia Panel: No results for input(s): VITAMINB12, FOLATE, FERRITIN, TIBC, IRON, RETICCTPCT in the last 72 hours. Sepsis Labs: Recent Labs  Lab 12/30/17 1848 12/30/17 2042  LATICACIDVEN 1.54 1.72    Recent Results (from the past 240 hour(s))  Culture, blood (Routine x 2)     Status: Abnormal   Collection Time: 12/30/17  6:30 PM  Result Value Ref Range Status   Specimen Description BLOOD RIGHT ANTECUBITAL  Final   Special  Requests   Final    BOTTLES DRAWN AEROBIC AND ANAEROBIC Blood Culture adequate volume   Culture  Setup Time   Final    GRAM POSITIVE COCCI ANAEROBIC BOTTLE ONLY CRITICAL VALUE NOTED.  VALUE IS CONSISTENT WITH PREVIOUSLY REPORTED AND CALLED VALUE.    Culture STAPHYLOCOCCUS EPIDERMIDIS (A)  Final   Report Status 01/04/2018 FINAL  Final   Organism ID, Bacteria STAPHYLOCOCCUS EPIDERMIDIS  Final      Susceptibility   Staphylococcus epidermidis - MIC*    CIPROFLOXACIN <=0.5 SENSITIVE Sensitive     ERYTHROMYCIN 0.5 SENSITIVE Sensitive     GENTAMICIN <=0.5 SENSITIVE Sensitive     OXACILLIN <=0.25 SENSITIVE Sensitive     TETRACYCLINE <=1 SENSITIVE Sensitive     VANCOMYCIN <=0.5 SENSITIVE Sensitive     TRIMETH/SULFA <=10 SENSITIVE Sensitive     CLINDAMYCIN <=0.25 SENSITIVE Sensitive     RIFAMPIN <=0.5 SENSITIVE Sensitive     Inducible Clindamycin NEGATIVE Sensitive     * STAPHYLOCOCCUS EPIDERMIDIS  Culture, blood (Routine x 2)     Status: Abnormal   Collection Time: 12/30/17  6:40 PM  Result Value Ref Range Status   Specimen Description BLOOD LEFT ANTECUBITAL  Final   Special Requests   Final    BOTTLES DRAWN AEROBIC AND ANAEROBIC Blood Culture adequate volume   Culture  Setup Time   Final    GRAM POSITIVE COCCI IN CLUSTERS IN BOTH AEROBIC AND ANAEROBIC BOTTLES CRITICAL RESULT CALLED TO, READ BACK BY AND VERIFIED WITH: A. MYER PHARMD, AT 2021 12/31/17 BY D. VANHOOK    Culture (A)  Final      STAPHYLOCOCCUS EPIDERMIDIS SUSCEPTIBILITIES PERFORMED ON PREVIOUS CULTURE WITHIN THE LAST 5 DAYS.    Report Status 01/04/2018 FINAL  Final  Blood Culture ID Panel (Reflexed)     Status: Abnormal   Collection Time: 12/30/17  6:40 PM  Result Value Ref Range Status   Enterococcus species NOT DETECTED NOT DETECTED Final   Listeria monocytogenes NOT DETECTED NOT DETECTED Final   Staphylococcus species DETECTED (A) NOT DETECTED Final    Comment: Methicillin (oxacillin) resistant coagulase negative staphylococcus. Possible blood culture contaminant (unless isolated from more than one blood culture draw or clinical case suggests pathogenicity). No antibiotic treatment is indicated for blood  culture contaminants. CRITICAL RESULT CALLED TO, READ BACK BY AND VERIFIED WITH: A. MYER PHARMD, AT 2021 12/31/17 BY D. VANHOOK    Staphylococcus aureus NOT DETECTED NOT DETECTED Final   Methicillin resistance DETECTED (A) NOT DETECTED Final    Comment: CRITICAL RESULT CALLED TO, READ BACK BY AND VERIFIED WITH: A. MYER PHARMD, AT 2021 12/31/17 BY D. VANHOOK    Streptococcus species NOT DETECTED NOT DETECTED Final   Streptococcus agalactiae NOT DETECTED NOT DETECTED Final   Streptococcus pneumoniae NOT DETECTED NOT DETECTED Final   Streptococcus pyogenes NOT DETECTED NOT DETECTED Final   Acinetobacter baumannii NOT DETECTED NOT DETECTED Final   Enterobacteriaceae species NOT DETECTED NOT DETECTED Final   Enterobacter cloacae complex NOT DETECTED NOT DETECTED Final   Escherichia coli NOT DETECTED NOT DETECTED Final   Klebsiella oxytoca NOT DETECTED NOT DETECTED Final   Klebsiella pneumoniae NOT DETECTED NOT DETECTED Final   Proteus species NOT DETECTED NOT DETECTED Final   Serratia marcescens NOT DETECTED NOT DETECTED Final   Haemophilus influenzae NOT DETECTED NOT DETECTED Final   Neisseria meningitidis NOT DETECTED NOT DETECTED Final   Pseudomonas aeruginosa NOT DETECTED NOT DETECTED Final   Candida  albicans NOT DETECTED NOT DETECTED Final  Candida glabrata NOT DETECTED NOT DETECTED Final   Candida krusei NOT DETECTED NOT DETECTED Final   Candida parapsilosis NOT DETECTED NOT DETECTED Final   Candida tropicalis NOT DETECTED NOT DETECTED Final  MRSA PCR Screening     Status: None   Collection Time: 12/31/17  8:38 AM  Result Value Ref Range Status   MRSA by PCR NEGATIVE NEGATIVE Final    Comment:        The GeneXpert MRSA Assay (FDA approved for NASAL specimens only), is one component of a comprehensive MRSA colonization surveillance program. It is not intended to diagnose MRSA infection nor to guide or monitor treatment for MRSA infections.   Urine Culture     Status: Abnormal   Collection Time: 12/31/17 10:54 AM  Result Value Ref Range Status   Specimen Description URINE, CLEAN CATCH  Final   Special Requests NONE  Final   Culture 40,000 COLONIES/mL ENTEROCOCCUS FAECALIS (A)  Final   Report Status 01/02/2018 FINAL  Final   Organism ID, Bacteria ENTEROCOCCUS FAECALIS (A)  Final      Susceptibility   Enterococcus faecalis - MIC*    AMPICILLIN <=2 SENSITIVE Sensitive     LEVOFLOXACIN 1 SENSITIVE Sensitive     NITROFURANTOIN <=16 SENSITIVE Sensitive     VANCOMYCIN 1 SENSITIVE Sensitive     * 40,000 COLONIES/mL ENTEROCOCCUS FAECALIS  Surgical pcr screen     Status: None   Collection Time: 01/01/18  2:08 PM  Result Value Ref Range Status   MRSA, PCR NEGATIVE NEGATIVE Final   Staphylococcus aureus NEGATIVE NEGATIVE Final    Comment: (NOTE) The Xpert SA Assay (FDA approved for NASAL specimens in patients 27 years of age and older), is one component of a comprehensive surveillance program. It is not intended to diagnose infection nor to guide or monitor treatment.   CSF culture     Status: None (Preliminary result)   Collection Time: 01/01/18  7:37 PM  Result Value Ref Range Status   Specimen Description CSF  Final   Special Requests SHUNT  Final   Gram Stain   Final      WBC PRESENT, PREDOMINANTLY MONONUCLEAR NO ORGANISMS SEEN CYTOSPIN SMEAR    Culture NO GROWTH 2 DAYS  Final   Report Status PENDING  Incomplete  Aerobic/Anaerobic Culture (surgical/deep wound)     Status: None (Preliminary result)   Collection Time: 01/01/18  7:39 PM  Result Value Ref Range Status   Specimen Description WOUND  Final   Special Requests VP SHUNT FROM BRAIN  Final   Gram Stain   Final    ABUNDANT WBC PRESENT,BOTH PMN AND MONONUCLEAR NO ORGANISMS SEEN    Culture   Final    FEW HAEMOPHILUS INFLUENZAE BETA LACTAMASE NEGATIVE NO ANAEROBES ISOLATED; CULTURE IN PROGRESS FOR 5 DAYS CRITICAL RESULT CALLED TO, READ BACK BY AND VERIFIED WITH: A ELLIOTT,RN AT 1623 01/05/18 BY L BENFIELD CONCERNING GROWTH ON CULTURE    Report Status PENDING  Incomplete         Radiology Studies: No results found.      Scheduled Meds: . dolutegravir  50 mg Oral Daily  . emtricitabine-rilpivir-tenofovir AF  1 tablet Oral Q breakfast  . fluconazole  200 mg Oral Daily  . mouth rinse  15 mL Mouth Rinse BID  . metoCLOPramide (REGLAN) injection  10 mg Intravenous Q6H  . sulfamethoxazole-trimethoprim  1 tablet Oral Once per day on Mon Wed Fri   Continuous Infusions: . sodium chloride 100 mL/hr at 01/06/18  1000  . lactated ringers Stopped (01/04/18 2000)  . levETIRAcetam Stopped (01/06/18 1610)  . potassium chloride 10 mEq (01/06/18 0929)  . vancomycin Stopped (01/06/18 0644)     LOS: 6 days    Time spent: 35 minutes.     Barnetta Chapel, MD [facility] Pager [phone]  If 7PM-7AM, please contact night-coverage [email or web address] Password [facility] 01/06/2018, 10:21 AM

## 2018-01-07 ENCOUNTER — Ambulatory Visit: Payer: Medicaid Other | Admitting: Infectious Disease

## 2018-01-07 LAB — AEROBIC/ANAEROBIC CULTURE (SURGICAL/DEEP WOUND)

## 2018-01-07 LAB — AEROBIC/ANAEROBIC CULTURE W GRAM STAIN (SURGICAL/DEEP WOUND)

## 2018-01-07 LAB — VANCOMYCIN, TROUGH: VANCOMYCIN TR: 21 ug/mL — AB (ref 15–20)

## 2018-01-07 MED ORDER — POTASSIUM CHLORIDE IN NACL 20-0.9 MEQ/L-% IV SOLN
INTRAVENOUS | Status: AC
  Administered 2018-01-07 – 2018-01-08 (×2): via INTRAVENOUS
  Filled 2018-01-07 (×3): qty 1000

## 2018-01-07 MED ORDER — POTASSIUM CHLORIDE 10 MEQ/100ML IV SOLN
10.0000 meq | INTRAVENOUS | Status: AC
  Administered 2018-01-07 (×6): 10 meq via INTRAVENOUS
  Filled 2018-01-07 (×7): qty 100

## 2018-01-07 MED ORDER — VANCOMYCIN HCL IN DEXTROSE 750-5 MG/150ML-% IV SOLN
750.0000 mg | Freq: Three times a day (TID) | INTRAVENOUS | Status: DC
  Administered 2018-01-07 – 2018-01-10 (×9): 750 mg via INTRAVENOUS
  Filled 2018-01-07 (×11): qty 150

## 2018-01-07 NOTE — Progress Notes (Signed)
Pt placed back on LWS for clamping trial @ 2pm.  No complications with clamping so far.  Will continue to monitor pt.

## 2018-01-07 NOTE — Evaluation (Signed)
Physical Therapy Evaluation Patient Details Name: Justin Ball MRN: [id number] DOB: 07/03/1991 Today's Date: 01/07/2018   History of Present Illness  27 y.o. 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 Handed pt pair of socks and he had no idea how to put them on, unaware of place or time. Pt able to follow 2 step commands with increased time and needs cues to initiate at times. Pt with decreased cognition, balance and activity tolerance who denied further gait today. Pt will benefit from acute therapy to maximize mobility, function, gait and safety to decrease burden of care.     Follow Up Recommendations No PT follow up;Supervision/Assistance - 24 hour    Equipment Recommendations  None recommended by PT    Recommendations for Other Services       Precautions / Restrictions Precautions Precautions: Fall Precaution Comments: IVC, NGT      Mobility  Bed Mobility Overal bed mobility: Needs Assistance Bed Mobility: Supine to Sit     Supine to sit: Supervision     General bed mobility comments: supervision for lines  Transfers Overall transfer level: Needs assistance   Transfers: Sit to/from Stand Sit to Stand: Min guard         General transfer comment: cues for hand placement and safety  Ambulation/Gait Ambulation/Gait assistance: Min guard Ambulation Distance (Feet): 150 Feet Assistive device: None Gait Pattern/deviations: Step-through pattern;Decreased stride length   Gait velocity interpretation: Below normal speed for age/gender General Gait Details: cues for direction and increased stride and speed  Stairs            Wheelchair Mobility    Modified Rankin (Stroke  Patients Only)       Balance Overall balance assessment: Needs assistance   Sitting balance-Leahy Scale: Good       Standing balance-Leahy Scale: Good                               Pertinent Vitals/Pain Pain Assessment: No/denies pain    Home Living Family/patient expects to be discharged to:: Private residence Living Arrangements: Other relatives;Parent Available Help at Discharge: Family;Available 24 hours/day Type of Home: Mobile home Home Access: Stairs to enter   Entrance Stairs-Number of Steps: 3 Home Layout: One level Home Equipment: Shower seat      Prior Function Level of Independence: Needs assistance   Gait / Transfers Assistance Needed: independent  ADL's / Homemaking Assistance Needed: assist for bathing and dressing from mom or grandma, standing in shower        Hand Dominance        Extremity/Trunk Assessment   Upper Extremity Assessment Upper Extremity Assessment: Overall WFL for tasks assessed    Lower Extremity Assessment Lower Extremity Assessment: Overall WFL for tasks assessed    Cervical / Trunk Assessment Cervical / Trunk Assessment: Normal  Communication   Communication: No difficulties  Cognition Arousal/Alertness: Awake/alert Behavior During Therapy: Flat affect Overall Cognitive Status: Impaired/Different from baseline Area of Impairment: Problem solving;Safety/judgement                         Safety/Judgement: Decreased awareness of deficits;Decreased awareness of safety   Problem Solving:  Slow processing;Decreased initiation        General Comments      Exercises     Assessment/Plan    PT Assessment Patient needs continued PT services  PT Problem List Decreased mobility;Decreased safety awareness;Decreased activity tolerance;Decreased balance;Decreased cognition       PT Treatment Interventions Gait training;Patient/family education;Therapeutic exercise;Stair training;Balance  training;Functional mobility training;DME instruction;Therapeutic activities;Cognitive remediation    PT Goals (Current goals can be found in the Care Plan section)  Acute Rehab PT Goals Patient Stated Goal: return home and eat [place] PT Goal Formulation: With patient/family Time For Goal Achievement: 01/21/18 Potential to Achieve Goals: Good    Frequency Min 3X/week   Barriers to discharge        Co-evaluation               AM-PAC PT "6 Clicks" Daily Activity  Outcome Measure Difficulty turning over in bed (including adjusting bedclothes, sheets and blankets)?: A Little Difficulty moving from lying on back to sitting on the side of the bed? : A Little Difficulty sitting down on and standing up from a chair with arms (e.g., wheelchair, bedside commode, etc,.)?: A Little Help needed moving to and from a bed to chair (including a wheelchair)?: A Little Help needed walking in hospital room?: A Little Help needed climbing 3-5 steps with a railing? : A Little 6 Click Score: 18    End of Session Equipment Utilized During Treatment: Gait belt Activity Tolerance: Patient tolerated treatment well Patient left: in chair;with call bell/phone within reach;with chair alarm set;with family/visitor present;with nursing/sitter in room Nurse Communication: Mobility status PT Visit Diagnosis: Other abnormalities of gait and mobility (R26.89)    Time: 4098-1191 PT Time Calculation (min) (ACUTE ONLY): 21 min   Charges:   PT Evaluation $PT Eval Moderate Complexity: 1 Mod     PT G Codes:        Delaney Meigs, PT [phone]   Ericah Scotto B Jamario Colina 01/07/2018, 2:18 PM

## 2018-01-07 NOTE — Progress Notes (Signed)
Pt sneezed really hard and his NG tube came out of his nose completely.  Pt has no complaints of pain at this time.  NG tube was clamped for trial overnight and is taking in clear liquids.  Will continue to monitor and see if he needs [facility]  Will leave out for now and see how he does.

## 2018-01-07 NOTE — Progress Notes (Signed)
[facility] PROGRESS NOTE  Justin Ball ZOX:[id number] DOB: 02/01/1991 DOA: 12/30/2017 PCP: No primary care provider on file.  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 Patient was seen by the PCP several days prior to admission with history of rash along his abd wall where his VP shunt was. Patient was initially placed on bactrim. The rash got worse and was tracking along the VP shunt location (under his skin along his chest wall). Patient was admitted with sepsis, and VP shunt infection. Patient has undergone VP shunt removal and placement of EVD (on 01/01/18) by Neurosurgery. Patient also developed ileus secondary to VP shunt infection.    Assessment/Plan:  Sepsis. likely related to shunt infection. Presented with fever, leukocytosis, tachycardia, tachypnea.  Neurosurgery consulted. Patient underwent  VP shunt removal, placement of EVD on 1-08. Blood culture grew Staph Epi and Strept Pneumonia. CSF culture: NGTD.  -improved IV Vanco, as well as Medication for HIV and Prophylaxis (Bactrim and fluconazole). No need to cover H. Flu, possible contaminant per ID. Appreciate ID, neurosurgery input   Ileus. Abdominal Pain; Nausea, vomiting. KUB with sign of SBO vs ileus.  -improving with NGT. General surgery following.   AIDS: Continue with Tivicay, Odefsey. ID following. On Fluconazole for history of cryptococcus Meningitis.   Seizure: Continue with Keppra. Change to IV due to NPO.   Abnormal electrolytes. Cont replacing , monitor labs   S/P VP Shunt. Neurosurgery has been consulted due to infection. 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 Plan to d/c EVD if stable. Cont per neurosurgery.   Chronic viral hepatitis. Bipolar; on Seroquel.     Code Status: full Family Communication: d/w patient, his family (indicate person  spoken with, relationship, and if by phone, the number) Disposition Plan: pend clinical improvement    Consultants:  ID  neurosurg  Procedures:  VP shunt removal   Antibiotics: Anti-infectives (From admission, onward)   Start     Dose/Rate Route Frequency Ordered Stop   01/07/18 1314  vancomycin (VANCOCIN) IVPB 750 mg/150 ml premix     750 mg 150 mL/hr over 60 Minutes Intravenous Every 8 hours 01/07/18 1314     01/05/18 0500  vancomycin (VANCOCIN) IVPB 1000 mg/200 mL premix  Status:  Discontinued     1,000 mg 200 mL/hr over 60 Minutes Intravenous Every 8 hours 01/04/18 1349 01/07/18 1314   01/04/18 1000  fluconazole (DIFLUCAN) tablet 200 mg     200 mg Oral Daily 01/04/18 0844     01/03/18 1000  fluconazole (DIFLUCAN) IVPB 200 mg  Status:  Discontinued     200 mg 100 mL/hr over 60 Minutes Intravenous Every 24 hours 01/02/18 1525 01/04/18 0844   01/02/18 1100  vancomycin (VANCOCIN) 1,250 mg in sodium chloride 0.9 % 250 mL IVPB  Status:  Discontinued     1,250 mg 166.7 mL/hr over 90 Minutes Intravenous Every 8 hours 01/02/18 1025 01/04/18 1137   01/02/18 0900  sulfamethoxazole-trimethoprim (BACTRIM DS,SEPTRA DS) 800-160 MG per tablet 1 tablet     1 tablet Oral Once per day on Mon Wed Fri 12/31/17 1003     01/01/18 0930  fluconazole (DIFLUCAN) IVPB 400 mg  Status:  Discontinued     400 mg 100 mL/hr over 120 Minutes Intravenous Every 24 hours 01/01/18 0830 01/02/18 1525   12/31/17 2200  vancomycin (VANCOCIN) IVPB 1000 mg/200 mL premix  Status:  Discontinued     1,000 mg 200 mL/hr over 60 Minutes Intravenous Every 8 hours 12/31/17 2048 01/02/18 1025   12/31/17 1400  cefTAZidime (FORTAZ) 2 g in dextrose 5 % 50 mL IVPB  Status:  Discontinued     2 g 100 mL/hr over 30 Minutes Intravenous Every 8 hours 12/31/17 1119 01/01/18 0808   12/31/17 1200  dolutegravir (TIVICAY) tablet 50 mg     50 mg Oral Daily 12/31/17 0207     12/31/17 1200  emtricitabine-rilpivir-tenofovir AF (ODEFSEY)  200-25-25 MG per tablet 1 tablet     1 tablet Oral Daily with breakfast 12/31/17 0207     12/31/17 1100  fluconazole (DIFLUCAN) tablet 400 mg  Status:  Discontinued     400 mg Oral Daily 12/31/17 1003 01/01/18 0829   12/31/17 1000  vancomycin (VANCOCIN) IVPB 1000 mg/200 mL premix  Status:  Discontinued     1,000 mg 200 mL/hr over 60 Minutes Intravenous 2 times daily 12/31/17 0444 12/31/17 2048   12/31/17 0215  sulfamethoxazole-trimethoprim (BACTRIM DS,SEPTRA DS) 800-160 MG per tablet 1 tablet  Status:  Discontinued     1 tablet Oral 2 times daily 12/31/17 0207 12/31/17 1003   12/31/17 0215  cefTRIAXone (ROCEPHIN) 2 g in dextrose 5 % 50 mL IVPB  Status:  Discontinued     2 g 100 mL/hr over 30 Minutes Intravenous Every 24 hours 12/31/17 0207 12/31/17 1119   12/30/17 2345  vancomycin (VANCOCIN) 1,500 mg in sodium chloride 0.9 % 500 mL IVPB     1,500 mg 250 mL/hr over 120 Minutes Intravenous  Once 12/30/17 2331 12/31/17 0324   12/30/17 2330  cefTRIAXone (ROCEPHIN) 2 g in dextrose 5 % 50 mL IVPB     2 g 100 mL/hr over 30 Minutes Intravenous  Once 12/30/17 2328 12/31/17 0122        (indicate start date, and stop date if known)  HPI/Subjective: Alert. No distress. Afebrile.   Objective: Vitals:   01/07/18 1100 01/07/18 1200  BP: 122/85 120/88  Pulse: 68 62  Resp: 19 19  Temp:  98.6 F (37 C)  SpO2: 98% 100%    Intake/Output Summary (Last 24 hours) at 01/07/2018 1416 Last data filed at 01/07/2018 1357 Gross per 24 hour  Intake 2510 ml  Output 5350 ml  Net -2840 ml   Filed Weights   12/30/17 1808 12/31/17 0758  Weight: 81.6 kg (180 lb) 78.8 kg (173 lb 11.6 oz)    Exam:   General:  No distress   Cardiovascular: s1,s2 rrr  Respiratory: CTA BL  Abdomen: soft, tn n  Musculoskeletal: no leg edema    Data Reviewed: Basic Metabolic Panel: Recent Labs  Lab 01/03/18 1629 01/04/18 0956 01/05/18 0305 01/06/18 0338 01/06/18 2244  NA 143 142 141 138 136  K 3.1* 3.2*  3.0* 2.9* 3.2*  CL 109 109 108 105 105  CO2 25 26 24 24 22   GLUCOSE 93 94 87 89 83  BUN 17 11 6  <5* <5*  CREATININE 0.91 0.99 0.94 0.70 0.84  CALCIUM 8.1* 8.1* 8.2* 8.1* 8.2*  MG 1.9  --   --  2.0  --   PHOS 2.9 2.6 3.6 2.7 2.8   Liver Function Tests: Recent Labs  Lab 01/03/18 1629 01/04/18 0956 01/05/18 0305 01/06/18 0338 01/06/18 2244  ALBUMIN 2.1* 2.2* 2.3* 2.1* 2.2*   No results for input(s): LIPASE, AMYLASE in the last 168 hours. No results for input(s): AMMONIA in the last 168 hours.  CBC: Recent Labs  Lab 01/02/18 0748 01/03/18 1629 01/04/18 0513 01/05/18 0305 01/06/18 0338  WBC 40.1* 21.2* 14.7* 22.3* 24.9*  NEUTROABS  --  19.0* 13.7* 18.6* 22.4*  HGB 10.4* 10.4* 10.4* 10.9* 10.3*  HCT 31.3* 32.2* 31.9* 33.7* 31.7*  MCV 95.7 94.7 96.4 96.6 95.5  PLT 331 363 355 400 432*   Cardiac Enzymes: No results for input(s): CKTOTAL, CKMB, CKMBINDEX, TROPONINI in the last 168 hours. BNP (last 3 results) No results for input(s): BNP in the last 8760 hours.  ProBNP (last 3 results) No results for input(s): PROBNP in the last 8760 hours.  CBG: No results for input(s): GLUCAP in the last 168 hours.  Recent Results (from the past 240 hour(s))  Culture, blood (Routine x 2)     Status: Abnormal   Collection Time: 12/30/17  6:30 PM  Result Value Ref Range Status   Specimen Description BLOOD RIGHT ANTECUBITAL  Final   Special Requests   Final    BOTTLES DRAWN AEROBIC AND ANAEROBIC Blood Culture adequate volume   Culture  Setup Time   Final    GRAM POSITIVE COCCI ANAEROBIC BOTTLE ONLY CRITICAL VALUE NOTED.  VALUE IS CONSISTENT WITH PREVIOUSLY REPORTED AND CALLED VALUE.    Culture STAPHYLOCOCCUS EPIDERMIDIS (A)  Final   Report Status 01/04/2018 FINAL  Final   Organism ID, Bacteria STAPHYLOCOCCUS EPIDERMIDIS  Final      Susceptibility   Staphylococcus epidermidis - MIC*    CIPROFLOXACIN <=0.5 SENSITIVE Sensitive     ERYTHROMYCIN 0.5 SENSITIVE Sensitive     GENTAMICIN  <=0.5 SENSITIVE Sensitive     OXACILLIN <=0.25 SENSITIVE Sensitive     TETRACYCLINE <=1 SENSITIVE Sensitive     VANCOMYCIN <=0.5 SENSITIVE Sensitive     TRIMETH/SULFA <=10 SENSITIVE Sensitive     CLINDAMYCIN <=0.25 SENSITIVE Sensitive     RIFAMPIN <=0.5 SENSITIVE Sensitive     Inducible Clindamycin NEGATIVE Sensitive     * STAPHYLOCOCCUS EPIDERMIDIS  Culture, blood (Routine x 2)     Status: Abnormal   Collection Time: 12/30/17  6:40 PM  Result Value Ref Range Status   Specimen Description BLOOD LEFT ANTECUBITAL  Final   Special Requests   Final    BOTTLES DRAWN AEROBIC AND ANAEROBIC Blood Culture adequate volume   Culture  Setup Time   Final    GRAM POSITIVE COCCI IN CLUSTERS IN BOTH AEROBIC AND ANAEROBIC BOTTLES CRITICAL RESULT CALLED TO, READ BACK BY AND VERIFIED WITH: A. MYER PHARMD, AT 2021 12/31/17 BY D. VANHOOK    Culture (A)  Final    STAPHYLOCOCCUS EPIDERMIDIS SUSCEPTIBILITIES PERFORMED ON PREVIOUS CULTURE WITHIN THE LAST 5 DAYS.    Report Status 01/04/2018 FINAL  Final  Blood Culture ID Panel (Reflexed)     Status: Abnormal   Collection Time: 12/30/17  6:40 PM  Result Value Ref Range Status   Enterococcus species NOT DETECTED NOT DETECTED Final   Listeria monocytogenes NOT DETECTED NOT DETECTED Final   Staphylococcus species DETECTED (A) NOT DETECTED Final    Comment: Methicillin (oxacillin) resistant coagulase negative staphylococcus. Possible blood culture contaminant (unless isolated from more than one blood culture draw or clinical case suggests pathogenicity). No antibiotic treatment is indicated for blood  culture contaminants. CRITICAL RESULT CALLED TO, READ BACK BY AND VERIFIED WITH: A. MYER PHARMD, AT 2021 12/31/17 BY D. VANHOOK    Staphylococcus aureus NOT DETECTED NOT DETECTED Final   Methicillin resistance DETECTED (A) NOT DETECTED Final    Comment: CRITICAL RESULT CALLED TO, READ  BACK BY AND VERIFIED WITH: A. MYER PHARMD, AT 2021 12/31/17 BY D. VANHOOK     Streptococcus species NOT DETECTED NOT DETECTED Final   Streptococcus agalactiae NOT DETECTED NOT DETECTED Final   Streptococcus pneumoniae NOT DETECTED NOT DETECTED Final   Streptococcus pyogenes NOT DETECTED NOT DETECTED Final   Acinetobacter baumannii NOT DETECTED NOT DETECTED Final   Enterobacteriaceae species NOT DETECTED NOT DETECTED Final   Enterobacter cloacae complex NOT DETECTED NOT DETECTED Final   Escherichia coli NOT DETECTED NOT DETECTED Final   Klebsiella oxytoca NOT DETECTED NOT DETECTED Final   Klebsiella pneumoniae NOT DETECTED NOT DETECTED Final   Proteus species NOT DETECTED NOT DETECTED Final   Serratia marcescens NOT DETECTED NOT DETECTED Final   Haemophilus influenzae NOT DETECTED NOT DETECTED Final   Neisseria meningitidis NOT DETECTED NOT DETECTED Final   Pseudomonas aeruginosa NOT DETECTED NOT DETECTED Final   Candida albicans NOT DETECTED NOT DETECTED Final   Candida glabrata NOT DETECTED NOT DETECTED Final   Candida krusei NOT DETECTED NOT DETECTED Final   Candida parapsilosis NOT DETECTED NOT DETECTED Final   Candida tropicalis NOT DETECTED NOT DETECTED Final  MRSA PCR Screening     Status: None   Collection Time: 12/31/17  8:38 AM  Result Value Ref Range Status   MRSA by PCR NEGATIVE NEGATIVE Final    Comment:        The GeneXpert MRSA Assay (FDA approved for NASAL specimens only), is one component of a comprehensive MRSA colonization surveillance program. It is not intended to diagnose MRSA infection nor to guide or monitor treatment for MRSA infections.   Urine Culture     Status: Abnormal   Collection Time: 12/31/17 10:54 AM  Result Value Ref Range Status   Specimen Description URINE, CLEAN CATCH  Final   Special Requests NONE  Final   Culture 40,000 COLONIES/mL ENTEROCOCCUS FAECALIS (A)  Final   Report Status 01/02/2018 FINAL  Final   Organism ID, Bacteria ENTEROCOCCUS FAECALIS (A)  Final      Susceptibility   Enterococcus faecalis - MIC*     AMPICILLIN <=2 SENSITIVE Sensitive     LEVOFLOXACIN 1 SENSITIVE Sensitive     NITROFURANTOIN <=16 SENSITIVE Sensitive     VANCOMYCIN 1 SENSITIVE Sensitive     * 40,000 COLONIES/mL ENTEROCOCCUS FAECALIS  Surgical pcr screen     Status: None   Collection Time: 01/01/18  2:08 PM  Result Value Ref Range Status   MRSA, PCR NEGATIVE NEGATIVE Final   Staphylococcus aureus NEGATIVE NEGATIVE Final    Comment: (NOTE) The Xpert SA Assay (FDA approved for NASAL specimens in patients 49 years of age and older), is one component of a comprehensive surveillance program. It is not intended to diagnose infection nor to guide or monitor treatment.   CSF culture     Status: None   Collection Time: 01/01/18  7:37 PM  Result Value Ref Range Status   Specimen Description CSF  Final   Special Requests SHUNT  Final   Gram Stain   Final    WBC PRESENT, PREDOMINANTLY MONONUCLEAR NO ORGANISMS SEEN CYTOSPIN SMEAR    Culture NO GROWTH 3 DAYS  Final   Report Status 01/06/2018 FINAL  Final  Aerobic/Anaerobic Culture (surgical/deep wound)     Status: None (Preliminary result)   Collection Time: 01/01/18  7:39 PM  Result Value Ref Range Status   Specimen Description WOUND  Final   Special Requests VP SHUNT FROM BRAIN  Final   Gram  Stain   Final    ABUNDANT WBC PRESENT,BOTH PMN AND MONONUCLEAR NO ORGANISMS SEEN    Culture   Final    FEW HAEMOPHILUS INFLUENZAE BETA LACTAMASE NEGATIVE NO ANAEROBES ISOLATED; CULTURE IN PROGRESS FOR 5 DAYS CRITICAL RESULT CALLED TO, READ BACK BY AND VERIFIED WITH: A ELLIOTT,RN AT 1623 01/05/18 BY L BENFIELD CONCERNING GROWTH ON CULTURE    Report Status PENDING  Incomplete     Studies: No results found.  Scheduled Meds: . dolutegravir  50 mg Oral Daily  . emtricitabine-rilpivir-tenofovir AF  1 tablet Oral Q breakfast  . fluconazole  200 mg Oral Daily  . mouth rinse  15 mL Mouth Rinse BID  . metoCLOPramide (REGLAN) injection  10 mg Intravenous Q6H  .  sulfamethoxazole-trimethoprim  1 tablet Oral Once per day on Mon Wed Fri   Continuous Infusions: . sodium chloride 100 mL/hr at 01/07/18 0800  . lactated ringers Stopped (01/04/18 2000)  . levETIRAcetam Stopped (01/07/18 0910)  . potassium chloride 10 mEq (01/07/18 1357)  . 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 If 7PM-7AM, please contact night-coverage at [email or web address], password [facility] 01/07/2018, 2:16 PM  LOS: 7 days

## 2018-01-07 NOTE — Progress Notes (Signed)
Patient ID: Justin Ball, male   DOB: 01/22/1991, 27 y.o.   MRN: [id number]          [facility]  Date of Admission:  12/30/2017           Day 8 vancomycin         ASSESSMENT: His CSF cultures are negative and he has no clinical evidence of ventriculitis.  He needs 2 more days of IV vancomycin for his coagulase-negative staph bacteremia and shunt infection.  His shunt has been removed.  It appears that he may not need to have his shunt replaced.  PLAN: 1. 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 dolutegravir  50 mg Oral Daily  . emtricitabine-rilpivir-tenofovir AF  1 tablet Oral Q breakfast  . fluconazole  200 mg Oral Daily  . mouth rinse  15 mL Mouth Rinse BID  . metoCLOPramide (REGLAN) injection  10 mg Intravenous Q6H  . sulfamethoxazole-trimethoprim  1 tablet Oral Once per day on Mon Wed Fri   Continuous Infusions: . sodium chloride 100 mL/hr at 01/07/18 0800  . lactated ringers Stopped (01/04/18 2000)  . levETIRAcetam Stopped (01/07/18 0910)  . potassium chloride 10 mEq (01/07/18 1238)  . vancomycin     PRN Meds:.acetaminophen, acetaminophen, hydrocortisone cream, morphine injection, ondansetron **OR** ondansetron (ZOFRAN) IV, prochlorperazine, QUEtiapine   SUBJECTIVE: He is feeling better.    Review of Systems: Review of Systems  Unable to perform ROS: Mental acuity    Allergies  Allergen Reactions  . Acyclovir And Related     OBJECTIVE: Vitals:   01/07/18 0900 01/07/18 1000 01/07/18 1100 01/07/18 1200  BP: 119/89 115/83 122/85 120/88  Pulse:  87 68 62  Resp: (!)  22 (!) 22 19 19   Temp:    98.6 F (37 C)  TempSrc:    Oral  SpO2:  99% 98% 100%  Weight:      Height:       Body mass index is 22.92 kg/m.  Physical Exam  Constitutional:  He appears comfortable.  Cardiovascular: Normal rate and regular rhythm.  No murmur heard. Pulmonary/Chest: Effort normal and breath sounds normal.  The erythema and tenderness along his previous shunt tract have resolved.  Abdominal: Soft. He exhibits no distension. There is no tenderness.  Neurological: He is alert.    Lab Results Lab Results  Component Value Date   WBC 24.9 (H) 01/06/2018   HGB 10.3 (L) 01/06/2018   HCT 31.7 (L) 01/06/2018   MCV 95.5 01/06/2018   PLT 432 (H) 01/06/2018    Lab Results  Component Value Date   CREATININE 0.84 01/06/2018   BUN <5 (L) 01/06/2018   NA 136 01/06/2018   K 3.2 (L) 01/06/2018   CL 105 01/06/2018   CO2 22 01/06/2018    Lab Results  Component Value Date   ALT 35 12/30/2017   AST 44 (H) 12/30/2017   ALKPHOS 96 12/30/2017   BILITOT 1.8 (H) 12/30/2017     Microbiology: Recent Results (from the past 240 hour(s))  Culture, blood (Routine x 2)     Status:  Abnormal   Collection Time: 12/30/17  6:30 PM  Result Value Ref Range Status   Specimen Description BLOOD RIGHT ANTECUBITAL  Final   Special Requests   Final    BOTTLES DRAWN AEROBIC AND ANAEROBIC Blood Culture adequate volume   Culture  Setup Time   Final    GRAM POSITIVE COCCI ANAEROBIC BOTTLE ONLY CRITICAL VALUE NOTED.  VALUE IS CONSISTENT WITH PREVIOUSLY REPORTED AND CALLED VALUE.    Culture STAPHYLOCOCCUS EPIDERMIDIS (A)  Final   Report Status 01/04/2018 FINAL  Final   Organism ID, Bacteria STAPHYLOCOCCUS EPIDERMIDIS  Final      Susceptibility   Staphylococcus epidermidis - MIC*    CIPROFLOXACIN <=0.5 SENSITIVE Sensitive     ERYTHROMYCIN 0.5 SENSITIVE Sensitive     GENTAMICIN <=0.5 SENSITIVE Sensitive     OXACILLIN <=0.25 SENSITIVE Sensitive     TETRACYCLINE <=1 SENSITIVE Sensitive      VANCOMYCIN <=0.5 SENSITIVE Sensitive     TRIMETH/SULFA <=10 SENSITIVE Sensitive     CLINDAMYCIN <=0.25 SENSITIVE Sensitive     RIFAMPIN <=0.5 SENSITIVE Sensitive     Inducible Clindamycin NEGATIVE Sensitive     * STAPHYLOCOCCUS EPIDERMIDIS  Culture, blood (Routine x 2)     Status: Abnormal   Collection Time: 12/30/17  6:40 PM  Result Value Ref Range Status   Specimen Description BLOOD LEFT ANTECUBITAL  Final   Special Requests   Final    BOTTLES DRAWN AEROBIC AND ANAEROBIC Blood Culture adequate volume   Culture  Setup Time   Final    GRAM POSITIVE COCCI IN CLUSTERS IN BOTH AEROBIC AND ANAEROBIC BOTTLES CRITICAL RESULT CALLED TO, READ BACK BY AND VERIFIED WITH: A. MYER PHARMD, AT 2021 12/31/17 BY D. VANHOOK    Culture (A)  Final    STAPHYLOCOCCUS EPIDERMIDIS SUSCEPTIBILITIES PERFORMED ON PREVIOUS CULTURE WITHIN THE LAST 5 DAYS.    Report Status 01/04/2018 FINAL  Final  Blood Culture ID Panel (Reflexed)     Status: Abnormal   Collection Time: 12/30/17  6:40 PM  Result Value Ref Range Status   Enterococcus species NOT DETECTED NOT DETECTED Final   Listeria monocytogenes NOT DETECTED NOT DETECTED Final   Staphylococcus species DETECTED (A) NOT DETECTED Final    Comment: Methicillin (oxacillin) resistant coagulase negative staphylococcus. Possible blood culture contaminant (unless isolated from more than one blood culture draw or clinical case suggests pathogenicity). No antibiotic treatment is indicated for blood  culture contaminants. CRITICAL RESULT CALLED TO, READ BACK BY AND VERIFIED WITH: A. MYER PHARMD, AT 2021 12/31/17 BY D. VANHOOK    Staphylococcus aureus NOT DETECTED NOT DETECTED Final   Methicillin resistance DETECTED (A) NOT DETECTED Final    Comment: CRITICAL RESULT CALLED TO, READ BACK BY AND VERIFIED WITH: A. MYER PHARMD, AT 2021 12/31/17 BY D. VANHOOK    Streptococcus species NOT DETECTED NOT DETECTED Final   Streptococcus agalactiae NOT DETECTED NOT DETECTED Final    Streptococcus pneumoniae NOT DETECTED NOT DETECTED Final   Streptococcus pyogenes NOT DETECTED NOT DETECTED Final   Acinetobacter baumannii NOT DETECTED NOT DETECTED Final   Enterobacteriaceae species NOT DETECTED NOT DETECTED Final   Enterobacter cloacae complex NOT DETECTED NOT DETECTED Final   Escherichia coli NOT DETECTED NOT DETECTED Final   Klebsiella oxytoca NOT DETECTED NOT DETECTED Final   Klebsiella pneumoniae NOT DETECTED NOT DETECTED Final   Proteus species NOT DETECTED NOT DETECTED Final   Serratia marcescens NOT DETECTED NOT DETECTED Final   Haemophilus influenzae NOT DETECTED NOT DETECTED Final  Neisseria meningitidis NOT DETECTED NOT DETECTED Final   Pseudomonas aeruginosa NOT DETECTED NOT DETECTED Final   Candida albicans NOT DETECTED NOT DETECTED Final   Candida glabrata NOT DETECTED NOT DETECTED Final   Candida krusei NOT DETECTED NOT DETECTED Final   Candida parapsilosis NOT DETECTED NOT DETECTED Final   Candida tropicalis NOT DETECTED NOT DETECTED Final  MRSA PCR Screening     Status: None   Collection Time: 12/31/17  8:38 AM  Result Value Ref Range Status   MRSA by PCR NEGATIVE NEGATIVE Final    Comment:        The GeneXpert MRSA Assay (FDA approved for NASAL specimens only), is one component of a comprehensive MRSA colonization surveillance program. It is not intended to diagnose MRSA infection nor to guide or monitor treatment for MRSA infections.   Urine Culture     Status: Abnormal   Collection Time: 12/31/17 10:54 AM  Result Value Ref Range Status   Specimen Description URINE, CLEAN CATCH  Final   Special Requests NONE  Final   Culture 40,000 COLONIES/mL ENTEROCOCCUS FAECALIS (A)  Final   Report Status 01/02/2018 FINAL  Final   Organism ID, Bacteria ENTEROCOCCUS FAECALIS (A)  Final      Susceptibility   Enterococcus faecalis - MIC*    AMPICILLIN <=2 SENSITIVE Sensitive     LEVOFLOXACIN 1 SENSITIVE Sensitive     NITROFURANTOIN <=16 SENSITIVE  Sensitive     VANCOMYCIN 1 SENSITIVE Sensitive     * 40,000 COLONIES/mL ENTEROCOCCUS FAECALIS  Surgical pcr screen     Status: None   Collection Time: 01/01/18  2:08 PM  Result Value Ref Range Status   MRSA, PCR NEGATIVE NEGATIVE Final   Staphylococcus aureus NEGATIVE NEGATIVE Final    Comment: (NOTE) The Xpert SA Assay (FDA approved for NASAL specimens in patients 54 years of age and older), is one component of a comprehensive surveillance program. It is not intended to diagnose infection nor to guide or monitor treatment.   CSF culture     Status: None   Collection Time: 01/01/18  7:37 PM  Result Value Ref Range Status   Specimen Description CSF  Final   Special Requests SHUNT  Final   Gram Stain   Final    WBC PRESENT, PREDOMINANTLY MONONUCLEAR NO ORGANISMS SEEN CYTOSPIN SMEAR    Culture NO GROWTH 3 DAYS  Final   Report Status 01/06/2018 FINAL  Final  Aerobic/Anaerobic Culture (surgical/deep wound)     Status: None (Preliminary result)   Collection Time: 01/01/18  7:39 PM  Result Value Ref Range Status   Specimen Description WOUND  Final   Special Requests VP SHUNT FROM BRAIN  Final   Gram Stain   Final    ABUNDANT WBC PRESENT,BOTH PMN AND MONONUCLEAR NO ORGANISMS SEEN    Culture   Final    FEW HAEMOPHILUS INFLUENZAE BETA LACTAMASE NEGATIVE NO ANAEROBES ISOLATED; CULTURE IN PROGRESS FOR 5 DAYS CRITICAL RESULT CALLED TO, READ BACK BY AND VERIFIED WITH: A ELLIOTT,RN AT 1623 01/05/18 BY L BENFIELD CONCERNING GROWTH ON CULTURE    Report Status PENDING  Incomplete    Cliffton Asters, MD [facility] [phone] pager   [phone] cell 01/07/2018, 1:40 PM

## 2018-01-07 NOTE — Progress Notes (Signed)
NG tube clamped at 1000.  Will trial clamping for 4 hrs.  Will continue to monitor pt.

## 2018-01-07 NOTE — Progress Notes (Signed)
No issues overnight. No HA. Want to eat.  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  PLAN: - [facility] cont to monitor exam[facility] If [facility], will d/c EVD

## 2018-01-07 NOTE — Progress Notes (Signed)
Pharmacy Antibiotic Note  Justin Ball is a 27 y.o. male admitted on 12/30/2017. Pharmacy has been consulted for vancomycin dosing for abdominal wall cellulitis + VP shunt infection. Pt is now afebrile and WBC is down to 14.7. VPS removed and EVD placed 1.8.  Vancomycin trough is 21 today on 1g IV q8h.- drawn 1.5hr early  Plan: Reduce to 750mg  IV q8h to prevent accumulation.  Recheck VT in several days to confirm. F/u renal fxn, C&S, clinical status and trough at SS  Height: 6\' 1"  (185.4 cm) Weight: 173 lb 11.6 oz (78.8 kg) IBW/kg (Calculated) : 79.9  Temp (24hrs), Avg:98.4 F (36.9 C), Min:98.2 F (36.8 C), Max:98.6 F (37 C)  Recent Labs  Lab 01/02/18 0748 01/03/18 1629 01/04/18 0513 01/04/18 0943 01/04/18 0956 01/05/18 0305 01/06/18 0338 01/06/18 2244 01/07/18 1159  WBC 40.1* 21.2* 14.7*  --   --  22.3* 24.9*  --   --   CREATININE 0.86 0.91  --   --  0.99 0.94 0.70 0.84  --   VANCOTROUGH 13*  --   --  30*  --   --   --   --  21*    Estimated Creatinine Clearance: 148.5 mL/min (by C-G formula based on SCr of 0.84 mg/dL).    Allergies  Allergen Reactions  . Acyclovir And Related     Antimicrobials this admission: Bactrim PTA >> Vanc 1/7 >> CTX 1/7 >> 1/7 Ceftaz 1/7 >> 1/8  Dose adjustments this admission: 1/9 VT = 13 on 1gm Q8H>>changed to 1250mg  IV Q8H 1/11 VT = 30 on 1250mg  Q8H>>changed back to 1gm Q8H  Microbiology results: 1/6 BCx >> 2/2 CoNS 1/7 UCx >> 40K enterococcus 1/7 MRSA PCR >> negative 1/8 CSF - NGTD  Thank you for allowing pharmacy to be a part of this patient's care.  Fayne NorrieMillen, Jas Betten Brown 01/07/2018 1:07 PM

## 2018-01-07 NOTE — Progress Notes (Signed)
[facility] Progress Note  6 Days Post-Op  Subjective: CC: ileus Patient denies abdominal pain. Passing some flatus, denies nausea or vomiting around NGT. Denies feeling bloated/distended. Wants to eat.  UOP good. VSS.   Objective: Vital signs in last 24 hours: Temp:  [98.1 F (36.7 C)-98.5 F (36.9 C)] 98.3 F (36.8 C) (01/14 0400) Pulse Rate:  [68-99] 73 (01/13 2100) Resp:  [16-24] 21 (01/14 0600) BP: (103-134)/(73-99) 125/84 (01/14 0600) SpO2:  [95 %-100 %] 98 % (01/14 0500) Last BM Date: 01/01/18  Intake/Output from previous day: 01/13 0701 - 01/14 0700 In: 3520 [I.V.:2100; IV Piggyback:1420] Out: [id number] [Urine:2875; Emesis/NG output:1250; Drains:1] Intake/Output this shift: No intake/output data recorded.  PE: Gen:  Alert, NAD, pleasant Card:  Regular rate and rhythm, pedal pulses 2+ BL Pulm:  Normal effort, clear to auscultation bilaterally Abd: Soft, non-tender, mildly distended, bowel sounds hypoactive but present, no HSM Skin: warm and dry Psych: A&Ox3   Lab Results:  Recent Labs    01/05/18 0305 01/06/18 0338  WBC 22.3* 24.9*  HGB 10.9* 10.3*  HCT 33.7* 31.7*  PLT 400 432*   BMET Recent Labs    01/06/18 0338 01/06/18 2244  NA 138 136  K 2.9* 3.2*  CL 105 105  CO2 24 22  GLUCOSE 89 83  BUN <5* <5*  CREATININE 0.70 0.84  CALCIUM 8.1* 8.2*   PT/INR No results for input(s): LABPROT, INR in the last 72 hours. CMP     Component Value Date/Time   NA 136 01/06/2018 2244   K 3.2 (L) 01/06/2018 2244   CL 105 01/06/2018 2244   CO2 22 01/06/2018 2244   GLUCOSE 83 01/06/2018 2244   BUN <5 (L) 01/06/2018 2244   CREATININE 0.84 01/06/2018 2244   CREATININE 0.92 09/13/2017 1649   CALCIUM 8.2 (L) 01/06/2018 2244   PROT 9.0 (H) 12/30/2017 1830   ALBUMIN 2.2 (L) 01/06/2018 2244   AST 44 (H) 12/30/2017 1830   ALT 35 12/30/2017 1830   ALT 38 07/10/2017 1152   ALKPHOS 96 12/30/2017 1830   BILITOT 1.8 (H) 12/30/2017 1830   GFRNONAA >60  01/06/2018 2244   GFRNONAA 114 09/13/2017 1649   GFRAA >60 01/06/2018 2244   GFRAA 133 09/13/2017 1649   Lipase     Component Value Date/Time   LIPASE 16 05/18/2017 1941       Studies/Results: No results found.  Anti-infectives: Anti-infectives (From admission, onward)   Start     Dose/Rate Route Frequency Ordered Stop   01/05/18 0500  vancomycin (VANCOCIN) IVPB 1000 mg/200 mL premix     1,000 mg 200 mL/hr over 60 Minutes Intravenous Every 8 hours 01/04/18 1349     01/04/18 1000  fluconazole (DIFLUCAN) tablet 200 mg     200 mg Oral Daily 01/04/18 0844     01/03/18 1000  fluconazole (DIFLUCAN) IVPB 200 mg  Status:  Discontinued     200 mg 100 mL/hr over 60 Minutes Intravenous Every 24 hours 01/02/18 1525 01/04/18 0844   01/02/18 1100  vancomycin (VANCOCIN) 1,250 mg in sodium chloride 0.9 % 250 mL IVPB  Status:  Discontinued     1,250 mg 166.7 mL/hr over 90 Minutes Intravenous Every 8 hours 01/02/18 1025 01/04/18 1137   01/02/18 0900  sulfamethoxazole-trimethoprim (BACTRIM DS,SEPTRA DS) 800-160 MG per tablet 1 tablet     1 tablet Oral Once per day on Mon Wed Fri 12/31/17 1003     01/01/18 0930  fluconazole (DIFLUCAN) IVPB 400 mg  Status:  Discontinued     400 mg 100 mL/hr over 120 Minutes Intravenous Every 24 hours 01/01/18 0830 01/02/18 1525   12/31/17 2200  vancomycin (VANCOCIN) IVPB 1000 mg/200 mL premix  Status:  Discontinued     1,000 mg 200 mL/hr over 60 Minutes Intravenous Every 8 hours 12/31/17 2048 01/02/18 1025   12/31/17 1400  cefTAZidime (FORTAZ) 2 g in dextrose 5 % 50 mL IVPB  Status:  Discontinued     2 g 100 mL/hr over 30 Minutes Intravenous Every 8 hours 12/31/17 1119 01/01/18 0808   12/31/17 1200  dolutegravir (TIVICAY) tablet 50 mg     50 mg Oral Daily 12/31/17 0207     12/31/17 1200  emtricitabine-rilpivir-tenofovir AF (ODEFSEY) 200-25-25 MG per tablet 1 tablet     1 tablet Oral Daily with breakfast 12/31/17 0207     12/31/17 1100  fluconazole (DIFLUCAN)  tablet 400 mg  Status:  Discontinued     400 mg Oral Daily 12/31/17 1003 01/01/18 0829   12/31/17 1000  vancomycin (VANCOCIN) IVPB 1000 mg/200 mL premix  Status:  Discontinued     1,000 mg 200 mL/hr over 60 Minutes Intravenous 2 times daily 12/31/17 0444 12/31/17 2048   12/31/17 0215  sulfamethoxazole-trimethoprim (BACTRIM DS,SEPTRA DS) 800-160 MG per tablet 1 tablet  Status:  Discontinued     1 tablet Oral 2 times daily 12/31/17 0207 12/31/17 1003   12/31/17 0215  cefTRIAXone (ROCEPHIN) 2 g in dextrose 5 % 50 mL IVPB  Status:  Discontinued     2 g 100 mL/hr over 30 Minutes Intravenous Every 24 hours 12/31/17 0207 12/31/17 1119   12/30/17 2345  vancomycin (VANCOCIN) 1,500 mg in sodium chloride 0.9 % 500 mL IVPB     1,500 mg 250 mL/hr over 120 Minutes Intravenous  Once 12/30/17 2331 12/31/17 0324   12/30/17 2330  cefTRIAXone (ROCEPHIN) 2 g in dextrose 5 % 50 mL IVPB     2 g 100 mL/hr over 30 Minutes Intravenous  Once 12/30/17 2328 12/31/17 0122       Assessment/Plan Infected VP shunt s/p removal Ileus - NGT on LIWS with 1250 cc output in 24 h - patient having some flatus now - reglan 10 mg IV q6h Hypokalemia - K 3.2, replace to >4.0 in setting of ileus  FEN: NPO, IVF; NGT to LIWS VTE: SCDs ID: vanc 1/12>>, PO bactrim 1/9>>, PO diflucan 1/9>>  Attempt clamping trial again today, if patient tolerates well may have some clears around NGT. If patient does not do well with clamping, will need repeat CT abd/pelvis. Mobilize as tolerated, replace K   LOS: 7 days    Wells Guiles , [facility] 01/07/2018, 8:55 AM Pager: [phone] Consults: [phone] Mon-Fri 7:00 am-4:30 pm Sat-Sun 7:00 am-11:30 am

## 2018-01-08 ENCOUNTER — Inpatient Hospital Stay (HOSPITAL_COMMUNITY): Payer: Medicaid Other

## 2018-01-08 LAB — CBC
HCT: 33.9 % — ABNORMAL LOW (ref 39.0–52.0)
HEMOGLOBIN: 11.1 g/dL — AB (ref 13.0–17.0)
MCH: 31.2 pg (ref 26.0–34.0)
MCHC: 32.7 g/dL (ref 30.0–36.0)
MCV: 95.2 fL (ref 78.0–100.0)
Platelets: 468 10*3/uL — ABNORMAL HIGH (ref 150–400)
RBC: 3.56 MIL/uL — AB (ref 4.22–5.81)
RDW: 14.5 % (ref 11.5–15.5)
WBC: 17.8 10*3/uL — ABNORMAL HIGH (ref 4.0–10.5)

## 2018-01-08 LAB — BASIC METABOLIC PANEL
Anion gap: 11 (ref 5–15)
CHLORIDE: 104 mmol/L (ref 101–111)
CO2: 21 mmol/L — AB (ref 22–32)
CREATININE: 0.86 mg/dL (ref 0.61–1.24)
Calcium: 8.4 mg/dL — ABNORMAL LOW (ref 8.9–10.3)
GFR calc Af Amer: >60 mL/min (ref >60)
GFR calc non Af Amer: >60 mL/min (ref >60)
GLUCOSE: 87 mg/dL (ref 65–99)
POTASSIUM: 3.5 mmol/L (ref 3.5–5.1)
Sodium: 136 mmol/L (ref 135–145)

## 2018-01-08 LAB — GLUCOSE, CAPILLARY
Glucose-Capillary: 79 mg/dL (ref 65–99)
Glucose-Capillary: 86 mg/dL (ref 65–99)

## 2018-01-08 LAB — PATHOLOGIST SMEAR REVIEW

## 2018-01-08 MED ORDER — SODIUM CHLORIDE 0.9% FLUSH
10.0000 mL | Freq: Two times a day (BID) | INTRAVENOUS | Status: DC
  Administered 2018-01-08 – 2018-01-15 (×8): 10 mL

## 2018-01-08 MED ORDER — DEXTROSE 5 % IV SOLN
15.0000 mmol | Freq: Once | INTRAVENOUS | Status: AC
  Administered 2018-01-08: 15 mmol via INTRAVENOUS
  Filled 2018-01-08: qty 5

## 2018-01-08 MED ORDER — DIATRIZOATE MEGLUMINE & SODIUM 66-10 % PO SOLN
90.0000 mL | Freq: Once | ORAL | Status: AC
  Administered 2018-01-08: 90 mL via NASOGASTRIC
  Filled 2018-01-08: qty 90

## 2018-01-08 MED ORDER — POTASSIUM CHLORIDE IN NACL 20-0.9 MEQ/L-% IV SOLN
INTRAVENOUS | Status: AC
  Administered 2018-01-09 – 2018-01-10 (×3): via INTRAVENOUS
  Filled 2018-01-08 (×3): qty 1000

## 2018-01-08 MED ORDER — CHLORHEXIDINE GLUCONATE CLOTH 2 % EX PADS
6.0000 | MEDICATED_PAD | Freq: Every day | CUTANEOUS | Status: DC
  Administered 2018-01-08 – 2018-01-16 (×6): 6 via TOPICAL

## 2018-01-08 MED ORDER — INSULIN ASPART 100 UNIT/ML ~~LOC~~ SOLN: 0.0000 [IU] | Freq: Four times a day (QID) | SUBCUTANEOUS | Status: DC

## 2018-01-08 MED ORDER — SODIUM CHLORIDE 0.9% FLUSH
10.0000 mL | INTRAVENOUS | Status: DC | PRN
  Administered 2018-01-11: 30 mL
  Filled 2018-01-08: qty 40

## 2018-01-08 MED ORDER — TRAVASOL 10 % IV SOLN
INTRAVENOUS | Status: AC
  Administered 2018-01-08: 18:00:00 via INTRAVENOUS
  Filled 2018-01-08: qty 478.8

## 2018-01-08 NOTE — Progress Notes (Signed)
Spoke to ConocoPhillipsConnie RN will update PICC order once she finds out why its needed and if a midline would suffice.Consuello Masseimmons, Dayveon Halley M

## 2018-01-08 NOTE — Evaluation (Signed)
Occupational Therapy Evaluation Patient Details Name: Justin Ball MRN: [id number] DOB: 1991/09/17 Today's Date: 01/08/2018    History of Present Illness 27 y.o. 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 He demonstrates the below listed deficits and will benefit from continued OT to maximize safety and independence with BADLs.  Pt presents to OT with impaired cognition, decreased balance, generalized weakness.  OT eval limited to bed level due to pt incontinent of loose stool.  He currently requires min - max A for ADLs.  He lives with his grandmother, who assists him as needed.  He required min A - mod A for ADLs PTA, and mod I for functional mobility.  Will follow acutely.       Follow Up Recommendations  Outpatient OT;Supervision/Assistance - 24 hour    Equipment Recommendations  None recommended by OT    Recommendations for Other Services       Precautions / Restrictions Precautions Precautions: Fall Precaution Comments: NGT       Mobility Bed Mobility Overal bed mobility: Needs Assistance Bed Mobility: Rolling Rolling: Supervision         General bed mobility comments: reliant on bedrails   Transfers                      Balance                                           ADL either performed or assessed with clinical judgement   ADL Overall ADL's : Needs assistance/impaired Eating/Feeding: NPO   Grooming: Wash/dry hands;Wash/dry face;Oral care;Set up;Supervision/safety;Bed level   Upper Body Bathing: Minimal assistance;Bed level   Lower Body Bathing: Maximal assistance;Bed level   Upper Body Dressing : Moderate assistance;Sitting   Lower Body Dressing: Total assistance       Toileting- Clothing  Manipulation and Hygiene: Total assistance;Bed level Toileting - Clothing Manipulation Details (indicate cue type and reason): Pt incontinent of stool requiring assist for peri care in supine.  Upon completion of peri care, pt requesting use of bedpan due to urgency and looseness of stool.  OT eval limited to bed level              Vision         Perception     Praxis      Pertinent Vitals/Pain Pain Assessment: No/denies pain     Hand Dominance Right   Extremity/Trunk Assessment Upper Extremity Assessment Upper Extremity Assessment: Overall WFL for tasks assessed   Lower Extremity Assessment Lower Extremity Assessment: Defer to PT evaluation   Cervical / Trunk Assessment Cervical / Trunk Assessment: Normal   Communication Communication Communication: No difficulties   Cognition Arousal/Alertness: Awake/alert Behavior During Therapy: Flat affect Overall Cognitive Status: Impaired/Different from baseline Area of Impairment: Attention;Memory;Following commands;Safety/judgement;Awareness;Problem solving                   Current Attention Level: Sustained;Selective Memory: Decreased short-term memory Following Commands: Follows multi-step commands inconsistently;Follows multi-step commands with increased time Safety/Judgement: Decreased awareness of safety;Decreased awareness of deficits   Problem Solving: Slow processing;Difficulty sequencing;Requires verbal cues;Requires tactile cues     General Comments  Exercises     Shoulder Instructions      Home Living Family/patient expects to be discharged to:: Private residence Living Arrangements: Other relatives;Parent Available Help at Discharge: Family;Available 24 hours/day Type of Home: Mobile home Home Access: Stairs to enter Entrance Stairs-Number of Steps: 3   Home Layout: One level     Bathroom Shower/Tub: Chief Strategy OfficerTub/shower unit   Bathroom Toilet: Standard     Home Equipment: Shower seat           Prior Functioning/Environment Level of Independence: Needs assistance  Gait / Transfers Assistance Needed: independent ADL's / Homemaking Assistance Needed: min - mod assist for bathing and dressing from mom or grandma, standing in shower.  Grandmother reports he wears adult diapers as he unaware when he has to have a BM             OT Problem List: Decreased strength;Decreased activity tolerance;Impaired balance (sitting and/or standing);Decreased cognition;Decreased safety awareness      OT Treatment/Interventions: Self-care/ADL training;Therapeutic exercise;DME and/or AE instruction;Therapeutic activities;Cognitive remediation/compensation;Patient/family education;Balance training    OT Goals(Current goals can be found in the care plan section) Acute Rehab OT Goals Patient Stated Goal: pt did not state.  Grandmother hopes he will return home at his baseline  OT Goal Formulation: With patient/family Time For Goal Achievement: 01/22/18 Potential to Achieve Goals: Good ADL Goals Pt Will Perform Grooming: with supervision;standing Pt Will Perform Upper Body Bathing: with supervision;sitting Pt Will Perform Lower Body Bathing: with min assist;sit to/from stand Pt Will Perform Upper Body Dressing: with supervision;sitting Pt Will Perform Lower Body Dressing: with mod assist;sit to/from stand Pt Will Transfer to Toilet: with supervision;ambulating;regular height toilet;bedside commode;grab bars Pt Will Perform Toileting - Clothing Manipulation and hygiene: with supervision;sit to/from stand  OT Frequency: Min 2X/week   Barriers to D/C:            Co-evaluation              AM-PAC PT "6 Clicks" Daily Activity     Outcome Measure Help from another person eating meals?: Total Help from another person taking care of personal grooming?: A Little Help from another person toileting, which includes using toliet, bedpan, or urinal?: A Lot Help from another person bathing  (including washing, rinsing, drying)?: A Lot Help from another person to put on and taking off regular upper body clothing?: A Lot Help from another person to put on and taking off regular lower body clothing?: A Lot 6 Click Score: 12   End of Session Nurse Communication: Mobility status  Activity Tolerance: Other (comment)(diarrhea and incontinence ) Patient left: in bed;with call bell/phone within reach;with nursing/sitter in room;with family/visitor present  OT Visit Diagnosis: Unsteadiness on feet (R26.81);Cognitive communication deficit (R41.841)                Time: [id number] OT Time Calculation (min): 31 min Charges:  OT General Charges $OT Visit: 1 Visit OT Evaluation $OT Eval Moderate Complexity: 1 Mod OT Treatments $Self Care/Home Management : 8-22 mins G-Codes:     Reyn[employer] AmericanWendi Kohle Winner, OTR/L [phone]   Jeani HawkingConarpe, Anaysia Germer M 01/08/2018, 5:05 PM

## 2018-01-08 NOTE — Progress Notes (Signed)
[facility] Progress Note  7 Days Post-Op  Subjective: CC: ileus Patient had large BM last night but then threw up later on. NGT had come out and was replaced. Patient feels a little better after NGT placement. Abdomen feels more bloated and is a little more sore. No flatus since vomiting.  UOP good. VSS.   Objective: Vital signs in last 24 hours: Temp:  [97.9 F (36.6 C)-99.3 F (37.4 C)] 99.3 F (37.4 C) (01/15 0400) Pulse Rate:  [60-96] 96 (01/15 0700) Resp:  [11-25] 21 (01/15 0700) BP: (112-137)/(67-96) 128/82 (01/15 0700) SpO2:  [93 %-100 %] 93 % (01/15 0700) Last BM Date: 01/08/18  Intake/Output from previous day: 01/14 0701 - 01/15 0700 In: 3288.3 [P.O.:600; I.V.:2018.3; IV Piggyback:670] Out: 3700 [Urine:3150; Emesis/NG output:550] Intake/Output this shift: No intake/output data recorded.  PE: Gen:  Alert, NAD, pleasant Card:  Regular rate and rhythm, pedal pulses 2+ BL Pulm:  Normal effort, clear to auscultation bilaterally Abd: Soft, mildly tender, distended, bowel sounds hypoactive, no HSM Skin: warm and dry Psych: A&Ox3    Lab Results:  Recent Labs    01/06/18 0338 01/08/18 0423  WBC 24.9* 17.8*  HGB 10.3* 11.1*  HCT 31.7* 33.9*  PLT 432* 468*   BMET Recent Labs    01/06/18 2244 01/08/18 0423  NA 136 136  K 3.2* 3.5  CL 105 104  CO2 22 21*  GLUCOSE 83 87  BUN <5* <5*  CREATININE 0.84 0.86  CALCIUM 8.2* 8.4*   PT/INR No results for input(s): LABPROT, INR in the last 72 hours. CMP     Component Value Date/Time   NA 136 01/08/2018 0423   K 3.5 01/08/2018 0423   CL 104 01/08/2018 0423   CO2 21 (L) 01/08/2018 0423   GLUCOSE 87 01/08/2018 0423   BUN <5 (L) 01/08/2018 0423   CREATININE 0.86 01/08/2018 0423   CREATININE 0.92 09/13/2017 1649   CALCIUM 8.4 (L) 01/08/2018 0423   PROT 9.0 (H) 12/30/2017 1830   ALBUMIN 2.2 (L) 01/06/2018 2244   AST 44 (H) 12/30/2017 1830   ALT 35 12/30/2017 1830   ALT 38 07/10/2017 1152   ALKPHOS 96 12/30/2017 1830   BILITOT 1.8 (H) 12/30/2017 1830   GFRNONAA >60 01/08/2018 0423   GFRNONAA 114 09/13/2017 1649   GFRAA >60 01/08/2018 0423   GFRAA 133 09/13/2017 1649   Lipase     Component Value Date/Time   LIPASE 16 05/18/2017 1941       Studies/Results: Dg Abd Portable 1v  Result Date: 01/08/2018 CLINICAL DATA:  27 year old male with NG tube placement. EXAM: PORTABLE ABDOMEN - 1 VIEW COMPARISON:  Abdominal radiograph dated 01/04/2018 and CT dated 12/30/2017 FINDINGS: Visualized distal enteric tube with tip in the gastric fundus. Diffusely air distended loops of small bowel measure up to 6 cm in the mid abdomen. No free air identified on the provided image. No radiopaque calculi. The osseous structures are intact. IMPRESSION: 1. Enteric tube with tip in the gastric fundus. There is persistent dilatation of the small bowel loops. 2. No free air. Electronically Signed   By: Elgie Collard M.D.   On: 01/08/2018 05:12    Anti-infectives: Anti-infectives (From admission, onward)   Start     Dose/Rate Route Frequency Ordered Stop   01/07/18 1314  vancomycin (VANCOCIN) IVPB 750 mg/150 ml premix     750 mg 150 mL/hr over 60 Minutes Intravenous Every 8 hours 01/07/18 1314     01/05/18 0500  vancomycin (VANCOCIN)  IVPB 1000 mg/200 mL premix  Status:  Discontinued     1,000 mg 200 mL/hr over 60 Minutes Intravenous Every 8 hours 01/04/18 1349 01/07/18 1314   01/04/18 1000  fluconazole (DIFLUCAN) tablet 200 mg     200 mg Oral Daily 01/04/18 0844     01/03/18 1000  fluconazole (DIFLUCAN) IVPB 200 mg  Status:  Discontinued     200 mg 100 mL/hr over 60 Minutes Intravenous Every 24 hours 01/02/18 1525 01/04/18 0844   01/02/18 1100  vancomycin (VANCOCIN) 1,250 mg in sodium chloride 0.9 % 250 mL IVPB  Status:  Discontinued     1,250 mg 166.7 mL/hr over 90 Minutes Intravenous Every 8 hours 01/02/18 1025 01/04/18 1137   01/02/18 0900  sulfamethoxazole-trimethoprim (BACTRIM DS,SEPTRA  DS) 800-160 MG per tablet 1 tablet     1 tablet Oral Once per day on Mon Wed Fri 12/31/17 1003     01/01/18 0930  fluconazole (DIFLUCAN) IVPB 400 mg  Status:  Discontinued     400 mg 100 mL/hr over 120 Minutes Intravenous Every 24 hours 01/01/18 0830 01/02/18 1525   12/31/17 2200  vancomycin (VANCOCIN) IVPB 1000 mg/200 mL premix  Status:  Discontinued     1,000 mg 200 mL/hr over 60 Minutes Intravenous Every 8 hours 12/31/17 2048 01/02/18 1025   12/31/17 1400  cefTAZidime (FORTAZ) 2 g in dextrose 5 % 50 mL IVPB  Status:  Discontinued     2 g 100 mL/hr over 30 Minutes Intravenous Every 8 hours 12/31/17 1119 01/01/18 0808   12/31/17 1200  dolutegravir (TIVICAY) tablet 50 mg     50 mg Oral Daily 12/31/17 0207     12/31/17 1200  emtricitabine-rilpivir-tenofovir AF (ODEFSEY) 200-25-25 MG per tablet 1 tablet     1 tablet Oral Daily with breakfast 12/31/17 0207     12/31/17 1100  fluconazole (DIFLUCAN) tablet 400 mg  Status:  Discontinued     400 mg Oral Daily 12/31/17 1003 01/01/18 0829   12/31/17 1000  vancomycin (VANCOCIN) IVPB 1000 mg/200 mL premix  Status:  Discontinued     1,000 mg 200 mL/hr over 60 Minutes Intravenous 2 times daily 12/31/17 0444 12/31/17 2048   12/31/17 0215  sulfamethoxazole-trimethoprim (BACTRIM DS,SEPTRA DS) 800-160 MG per tablet 1 tablet  Status:  Discontinued     1 tablet Oral 2 times daily 12/31/17 0207 12/31/17 1003   12/31/17 0215  cefTRIAXone (ROCEPHIN) 2 g in dextrose 5 % 50 mL IVPB  Status:  Discontinued     2 g 100 mL/hr over 30 Minutes Intravenous Every 24 hours 12/31/17 0207 12/31/17 1119   12/30/17 2345  vancomycin (VANCOCIN) 1,500 mg in sodium chloride 0.9 % 500 mL IVPB     1,500 mg 250 mL/hr over 120 Minutes Intravenous  Once 12/30/17 2331 12/31/17 0324   12/30/17 2330  cefTRIAXone (ROCEPHIN) 2 g in dextrose 5 % 50 mL IVPB     2 g 100 mL/hr over 30 Minutes Intravenous  Once 12/30/17 2328 12/31/17 0122       Assessment/Plan Infected VP shunt s/p  removal Ileus - NGT on LIWS  Start SB protocol today  - patient no longer having flatus - reglan 10 mg IV q6h Hypokalemia - K 3.5, replace to >4.0 in setting of ileus  FEN: NPO, IVF; NGT to LIWS; this will be 1 week without PO intake, start TPN VTE: SCDs ID: vanc 1/12>>, PO bactrim 1/9>>, PO diflucan 1/9>>  SB protocol. Start TPN.  LOS: 8 days    Wells GuilesKelly Rayburn , [facility] 01/08/2018, 8:58 AM Pager: [phone] Consults: [phone] Mon-Fri 7:00 am-4:30 pm Sat-Sun 7:00 am-11:30 am

## 2018-01-08 NOTE — Progress Notes (Signed)
Patient vomited large amount of green/clear bile onto floor. Patient stated that he felt nauseous for a very short period before he became sick. Stated "it came so fast" and didn't have time to call before it happened. NG tube was placed back into the Right nostril. Xray was called to confirm placement. Currently hooked up to low intermittent suction. Will continue to monitor.

## 2018-01-08 NOTE — Progress Notes (Signed)
No issues overnight. No HA today, no c/o abd pain or nausea currently.  EXAM:  BP 128/82   Pulse 96   Temp 99.6 F (37.6 C) (Oral)   Resp (!) 21   Ht 6\' 1"  (1.854 m)   Wt 78.8 kg (173 lb 11.6 oz)   SpO2 93%   BMI 22.92 kg/m   Awake, alert speech fluent, appropriate  CN grossly intact  5/5 BUE/BLE  EVD in place, clamped  CTH reviewed, demonstrates minimal increase in ventricular size, and interval pneumocephalus suggesting relatively low ventricular pressure.  IMPRESSION:  27 y.o. male with VPS infection, neurologically stable without CSF drainage x 72 hrs, CT largely unchanged.  PLAN: - EVD d/c'ed at bedside today - Cont iv abx per ID

## 2018-01-08 NOTE — Progress Notes (Signed)
PHARMACY - ADULT TOTAL PARENTERAL NUTRITION CONSULT NOTE   Pharmacy Consult:  TPN Indication:  Ileus  Patient Measurements: Height: 6\' 1"  (185.4 cm) Weight: 173 lb 11.6 oz (78.8 kg) IBW/kg (Calculated) : 79.9 TPN AdjBW (KG): 78.8 Body mass index is 22.92 kg/m.  Assessment:  [age] YOM with history of neurogenic bowel presented on 12/31/17 with fever and rash.  He was also having nausea and vomiting with hypoactive bowel sounds and distended abdomen on 01/01/18.  Diagnosed with ileus secondary to infected VP shunt.  Patient tolerated intermittent clamping of NG tube and consumed some clear liquids.  On 01/08/18, he vomited a large amount of green/clear bile.  Pharmacy consulted to manage TPN.  Patient reports no recent weight loss/gain and he was eating regularly PTA.  GI: NG O/P 550mL - Reglan IV Endo: no hx DM - AM glucose 80-90s Insulin requirements in the past 24 hours: N/A Lytes: all WNL except slightly low CO2 and K 3.5 (goal >/= 4 for ileus) (Na, Phos low normal) Renal: neurogenic bladder - SCr 0.86, BUN <5.  UOP 1.7 ml/kg/hr, NS 20K at 100 ml/hr Pulm: stable on RA Cards: VSS Hepatobil: Hep B Neuro: Bipolar / depression / CVA / seizure - Keppra IV, PRN APAP/morphine/Seroquel ID: Vanc (1/7 >> ) for CoNS bacteremia + VP shunt infxn.  Afebrile, WBC down 17.8 AIDS - dolutegravir, Odefsey, Septra/Fluc Hx cryptococcal meningitis s/p VP shunt placement >> removed and EVD replaced 1/9  TPN Access: PICC to be placed 01/08/18 TPN start date: 01/08/18  Nutritional Goals (RD rec pending): 1950-2100 kCal and 100-115 gm protein per day  Current Nutrition:  NPO   Plan:  Initiate TPN at 35 ml/hr (goal ~80 ml/hr).  TPN will provide 49g AA, 143g CHO and 21g ILE, which equals to 887 kCal, meeting ~45% of needs. Electrolytes in TPN: standard, Cl:Ac 1:2 Daily multivitamin and trace elements in TPN Reduce NS 20K to 65 ml/hr when TPN starts KPhos 15 mmol IV x 1 (by AM labs, patient will receive ~6440mEq  KCL from TPN and KPhos) Start sensitive SSI Q6H Standard TPN labs and nursing care orders   Justin Ball, PharmD, BCPS Pager:  [phone] 01/08/2018, 9:19 AM

## 2018-01-08 NOTE — Progress Notes (Signed)
Peripherally Inserted Central Catheter/Midline Placement  The IV Nurse has discussed with the patient and/or persons authorized to consent for the patient, the purpose of this procedure and the potential benefits and risks involved with this procedure.  The benefits include less needle sticks, lab draws from the catheter, and the patient may be discharged home with the catheter. Risks include, but not limited to, infection, bleeding, blood clot (thrombus formation), and puncture of an artery; nerve damage and irregular heartbeat and possibility to perform a PICC exchange if needed/ordered by physician.  Alternatives to this procedure were also discussed.  Bard Power PICC patient education guide, fact sheet on infection prevention and patient information card has been provided to patient /or left at bedside.    PICC/Midline Placement Documentation  PICC Double Lumen 01/08/18 PICC Right Basilic 41 cm 2 cm (Active)  Indication for Insertion or Continuance of Line Administration of hyperosmolar/irritating solutions (i.e. TPN, Vancomycin, etc.) 01/08/2018 11:00 AM  Exposed Catheter (cm) 2 cm 01/08/2018 11:00 AM  Dressing Change Due 01/15/18 01/08/2018 11:00 AM    Telephone consent given by Justin Ball (Grandmother)   Justin Ball, Justin Ball 01/08/2018, 11:14 AM

## 2018-01-08 NOTE — Progress Notes (Signed)
Patient ID: Justin Ball, male   DOB: 03/19/1991, 27 y.o.   MRN: [id number]          [facility]    Date of Admission:  12/30/2017   Day 9 vancomycin         He was out of his room for head CT scan this morning.  The CT scan does not show significant hydrocephalus.  His CSF cultures are negative.  Hopefully he will not need another VP shunt placed.  I recommend stopping vancomycin after tomorrow's doses.  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 I recommend continuing current HIV medications.  I will arrange follow-up in our clinic.  I will sign off now.         Cliffton AstersJohn Cambria Osten, MD [facility] [phone] pager   [phone] cell 01/08/2018, 1:05 PM

## 2018-01-08 NOTE — Progress Notes (Signed)
[facility] PROGRESS NOTE  Justin Ball ZOX:[id number] DOB: 08/09/1991 DOA: 12/30/2017 PCP: No primary care provider on file.  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 Patient was seen by the PCP several days prior to admission with history of rash along his abd wall where his VP shunt was. Patient was initially placed on bactrim. The rash got worse and was tracking along the VP shunt location (under his skin along his chest wall). Patient was admitted with sepsis, and VP shunt infection. Patient has undergone VP shunt removal and placement of EVD (on 01/01/18) by Neurosurgery. Patient also developed ileus secondary to VP shunt infection.    Assessment/Plan:  Sepsis. likely related to shunt infection. Presented with fever, leukocytosis, tachycardia, tachypnea.  Neurosurgery consulted. Patient underwent  VP shunt removal, placement of EVD on 1-08. Blood culture grew Staph Epi and Strept Pneumonia. CSF culture: NGTD.  -improved IV Vanco, as well as Medication for HIV and Prophylaxis (Bactrim and fluconazole). Cont antibiotics per ID. Appreciate ID, neurosurgery input   Ileus. Abdominal Pain; Nausea, vomiting. KUB with sign of SBO vs ileus.  -improving with NGT. General surgery following. Repeat KUB today   AIDS: Continue with T[place]ay, Charlett Lango. ID following. On Fluconazole for history of cryptococcus Meningitis.   Seizure: Continue with Keppra. Change to IV due to NPO.   Abnormal electrolytes. Cont replacing , monitor labs   S/P VP Shunt. Neurosurgery has been consulted due to infection. 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 EVD discontinued on 11/15. Cont per neurosurgery.   Chronic viral hepatitis. Bipolar; on Seroquel.     Code Status: full Family Communication: d/w patient, recently updated his family (indicate person  spoken with, relationship, and if by phone, the number) Disposition Plan: pend clinical improvement    Consultants:  ID  neurosurg  Procedures:  VP shunt removal   Antibiotics: Anti-infectives (From admission, onward)   Start     Dose/Rate Route Frequency Ordered Stop   01/07/18 1314  vancomycin (VANCOCIN) IVPB 750 mg/150 ml premix     750 mg 150 mL/hr over 60 Minutes Intravenous Every 8 hours 01/07/18 1314     01/05/18 0500  vancomycin (VANCOCIN) IVPB 1000 mg/200 mL premix  Status:  Discontinued     1,000 mg 200 mL/hr over 60 Minutes Intravenous Every 8 hours 01/04/18 1349 01/07/18 1314   01/04/18 1000  fluconazole (DIFLUCAN) tablet 200 mg     200 mg Oral Daily 01/04/18 0844     01/03/18 1000  fluconazole (DIFLUCAN) IVPB 200 mg  Status:  Discontinued     200 mg 100 mL/hr over 60 Minutes Intravenous Every 24 hours 01/02/18 1525 01/04/18 0844   01/02/18 1100  vancomycin (VANCOCIN) 1,250 mg in sodium chloride 0.9 % 250 mL IVPB  Status:  Discontinued     1,250 mg 166.7 mL/hr over 90 Minutes Intravenous Every 8 hours 01/02/18 1025 01/04/18 1137   01/02/18 0900  sulfamethoxazole-trimethoprim (BACTRIM DS,SEPTRA DS) 800-160 MG per tablet 1 tablet     1 tablet Oral Once per day on Mon Wed Fri 12/31/17 1003     01/01/18 0930  fluconazole (DIFLUCAN) IVPB 400 mg  Status:  Discontinued     400 mg 100 mL/hr over 120 Minutes Intravenous Every 24 hours 01/01/18 0830 01/02/18 1525   12/31/17 2200  vancomycin (VANCOCIN) IVPB 1000 mg/200 mL premix  Status:  Discontinued  1,000 mg 200 mL/hr over 60 Minutes Intravenous Every 8 hours 12/31/17 2048 01/02/18 1025   12/31/17 1400  cefTAZidime (FORTAZ) 2 g in dextrose 5 % 50 mL IVPB  Status:  Discontinued     2 g 100 mL/hr over 30 Minutes Intravenous Every 8 hours 12/31/17 1119 01/01/18 0808   12/31/17 1200  dolutegravir (TIVICAY) tablet 50 mg     50 mg Oral Daily 12/31/17 0207     12/31/17 1200  emtricitabine-rilpivir-tenofovir AF (ODEFSEY)  200-25-25 MG per tablet 1 tablet     1 tablet Oral Daily with breakfast 12/31/17 0207     12/31/17 1100  fluconazole (DIFLUCAN) tablet 400 mg  Status:  Discontinued     400 mg Oral Daily 12/31/17 1003 01/01/18 0829   12/31/17 1000  vancomycin (VANCOCIN) IVPB 1000 mg/200 mL premix  Status:  Discontinued     1,000 mg 200 mL/hr over 60 Minutes Intravenous 2 times daily 12/31/17 0444 12/31/17 2048   12/31/17 0215  sulfamethoxazole-trimethoprim (BACTRIM DS,SEPTRA DS) 800-160 MG per tablet 1 tablet  Status:  Discontinued     1 tablet Oral 2 times daily 12/31/17 0207 12/31/17 1003   12/31/17 0215  cefTRIAXone (ROCEPHIN) 2 g in dextrose 5 % 50 mL IVPB  Status:  Discontinued     2 g 100 mL/hr over 30 Minutes Intravenous Every 24 hours 12/31/17 0207 12/31/17 1119   12/30/17 2345  vancomycin (VANCOCIN) 1,500 mg in sodium chloride 0.9 % 500 mL IVPB     1,500 mg 250 mL/hr over 120 Minutes Intravenous  Once 12/30/17 2331 12/31/17 0324   12/30/17 2330  cefTRIAXone (ROCEPHIN) 2 g in dextrose 5 % 50 mL IVPB     2 g 100 mL/hr over 30 Minutes Intravenous  Once 12/30/17 2328 12/31/17 0122       (indicate start date, and stop date if known)  HPI/Subjective: Alert. No distress. Afebrile.   Objective: Vitals:   01/08/18 1100 01/08/18 1200  BP: 117/75 132/88  Pulse: 96 96  Resp: (!) 26 16  Temp:    SpO2: 100% 100%    Intake/Output Summary (Last 24 hours) at 01/08/2018 1244 Last data filed at 01/08/2018 0700 Gross per 24 hour  Intake 2888.33 ml  Output 2300 ml  Net 588.33 ml   Filed Weights   12/30/17 1808 12/31/17 0758  Weight: 81.6 kg (180 lb) 78.8 kg (173 lb 11.6 oz)    Exam:   General:  No distress   Cardiovascular: s1,s2 rrr  Respiratory: CTA BL  Abdomen: soft, tn n  Musculoskeletal: no leg edema    Data Reviewed: Basic Metabolic Panel: Recent Labs  Lab 01/03/18 1629 01/04/18 0956 01/05/18 0305 01/06/18 0338 01/06/18 2244 01/08/18 0423  NA 143 142 141 138 136 136  K  3.1* 3.2* 3.0* 2.9* 3.2* 3.5  CL 109 109 108 105 105 104  CO2 25 26 24 24 22  21*  GLUCOSE 93 94 87 89 83 87  BUN 17 11 6  <5* <5* <5*  CREATININE 0.91 0.99 0.94 0.70 0.84 0.86  CALCIUM 8.1* 8.1* 8.2* 8.1* 8.2* 8.4*  MG 1.9  --   --  2.0  --   --   PHOS 2.9 2.6 3.6 2.7 2.8  --    Liver Function Tests: Recent Labs  Lab 01/03/18 1629 01/04/18 0956 01/05/18 0305 01/06/18 0338 01/06/18 2244  ALBUMIN 2.1* 2.2* 2.3* 2.1* 2.2*   No results for input(s): LIPASE, AMYLASE in the last 168 hours. No results for input(s):  AMMONIA in the last 168 hours. CBC: Recent Labs  Lab 01/03/18 1629 01/04/18 0513 01/05/18 0305 01/06/18 0338 01/08/18 0423  WBC 21.2* 14.7* 22.3* 24.9* 17.8*  NEUTROABS 19.0* 13.7* 18.6* 22.4*  --   HGB 10.4* 10.4* 10.9* 10.3* 11.1*  HCT 32.2* 31.9* 33.7* 31.7* 33.9*  MCV 94.7 96.4 96.6 95.5 95.2  PLT 363 355 400 432* 468*   Cardiac Enzymes: No results for input(s): CKTOTAL, CKMB, CKMBINDEX, TROPONINI in the last 168 hours. BNP (last 3 results) No results for input(s): BNP in the last 8760 hours.  ProBNP (last 3 results) No results for input(s): PROBNP in the last 8760 hours.  CBG: No results for input(s): GLUCAP in the last 168 hours.  Recent Results (from the past 240 hour(s))  Culture, blood (Routine x 2)     Status: Abnormal   Collection Time: 12/30/17  6:30 PM  Result Value Ref Range Status   Specimen Description BLOOD RIGHT ANTECUBITAL  Final   Special Requests   Final    BOTTLES DRAWN AEROBIC AND ANAEROBIC Blood Culture adequate volume   Culture  Setup Time   Final    GRAM POSITIVE COCCI ANAEROBIC BOTTLE ONLY CRITICAL VALUE NOTED.  VALUE IS CONSISTENT WITH PREVIOUSLY REPORTED AND CALLED VALUE.    Culture STAPHYLOCOCCUS EPIDERMIDIS (A)  Final   Report Status 01/04/2018 FINAL  Final   Organism ID, Bacteria STAPHYLOCOCCUS EPIDERMIDIS  Final      Susceptibility   Staphylococcus epidermidis - MIC*    CIPROFLOXACIN <=0.5 SENSITIVE Sensitive      ERYTHROMYCIN 0.5 SENSITIVE Sensitive     GENTAMICIN <=0.5 SENSITIVE Sensitive     OXACILLIN <=0.25 SENSITIVE Sensitive     TETRACYCLINE <=1 SENSITIVE Sensitive     VANCOMYCIN <=0.5 SENSITIVE Sensitive     TRIMETH/SULFA <=10 SENSITIVE Sensitive     CLINDAMYCIN <=0.25 SENSITIVE Sensitive     RIFAMPIN <=0.5 SENSITIVE Sensitive     Inducible Clindamycin NEGATIVE Sensitive     * STAPHYLOCOCCUS EPIDERMIDIS  Culture, blood (Routine x 2)     Status: Abnormal   Collection Time: 12/30/17  6:40 PM  Result Value Ref Range Status   Specimen Description BLOOD LEFT ANTECUBITAL  Final   Special Requests   Final    BOTTLES DRAWN AEROBIC AND ANAEROBIC Blood Culture adequate volume   Culture  Setup Time   Final    GRAM POSITIVE COCCI IN CLUSTERS IN BOTH AEROBIC AND ANAEROBIC BOTTLES CRITICAL RESULT CALLED TO, READ BACK BY AND VERIFIED WITH: A. MYER PHARMD, AT 2021 12/31/17 BY D. VANHOOK    Culture (A)  Final    STAPHYLOCOCCUS EPIDERMIDIS SUSCEPTIBILITIES PERFORMED ON PREVIOUS CULTURE WITHIN THE LAST 5 DAYS.    Report Status 01/04/2018 FINAL  Final  Blood Culture ID Panel (Reflexed)     Status: Abnormal   Collection Time: 12/30/17  6:40 PM  Result Value Ref Range Status   Enterococcus species NOT DETECTED NOT DETECTED Final   Listeria monocytogenes NOT DETECTED NOT DETECTED Final   Staphylococcus species DETECTED (A) NOT DETECTED Final    Comment: Methicillin (oxacillin) resistant coagulase negative staphylococcus. Possible blood culture contaminant (unless isolated from more than one blood culture draw or clinical case suggests pathogenicity). No antibiotic treatment is indicated for blood  culture contaminants. CRITICAL RESULT CALLED TO, READ BACK BY AND VERIFIED WITH: A. MYER PHARMD, AT 2021 12/31/17 BY D. VANHOOK    Staphylococcus aureus NOT DETECTED NOT DETECTED Final   Methicillin resistance DETECTED (A) NOT DETECTED Final  Comment: CRITICAL RESULT CALLED TO, READ BACK BY AND VERIFIED  WITH: A. MYER PHARMD, AT 2021 12/31/17 BY D. VANHOOK    Streptococcus species NOT DETECTED NOT DETECTED Final   Streptococcus agalactiae NOT DETECTED NOT DETECTED Final   Streptococcus pneumoniae NOT DETECTED NOT DETECTED Final   Streptococcus pyogenes NOT DETECTED NOT DETECTED Final   Acinetobacter baumannii NOT DETECTED NOT DETECTED Final   Enterobacteriaceae species NOT DETECTED NOT DETECTED Final   Enterobacter cloacae complex NOT DETECTED NOT DETECTED Final   Escherichia coli NOT DETECTED NOT DETECTED Final   Klebsiella oxytoca NOT DETECTED NOT DETECTED Final   Klebsiella pneumoniae NOT DETECTED NOT DETECTED Final   Proteus species NOT DETECTED NOT DETECTED Final   Serratia marcescens NOT DETECTED NOT DETECTED Final   Haemophilus influenzae NOT DETECTED NOT DETECTED Final   Neisseria meningitidis NOT DETECTED NOT DETECTED Final   Pseudomonas aeruginosa NOT DETECTED NOT DETECTED Final   Candida albicans NOT DETECTED NOT DETECTED Final   Candida glabrata NOT DETECTED NOT DETECTED Final   Candida krusei NOT DETECTED NOT DETECTED Final   Candida parapsilosis NOT DETECTED NOT DETECTED Final   Candida tropicalis NOT DETECTED NOT DETECTED Final  MRSA PCR Screening     Status: None   Collection Time: 12/31/17  8:38 AM  Result Value Ref Range Status   MRSA by PCR NEGATIVE NEGATIVE Final    Comment:        The GeneXpert MRSA Assay (FDA approved for NASAL specimens only), is one component of a comprehensive MRSA colonization surveillance program. It is not intended to diagnose MRSA infection nor to guide or monitor treatment for MRSA infections.   Urine Culture     Status: Abnormal   Collection Time: 12/31/17 10:54 AM  Result Value Ref Range Status   Specimen Description URINE, CLEAN CATCH  Final   Special Requests NONE  Final   Culture 40,000 COLONIES/mL ENTEROCOCCUS FAECALIS (A)  Final   Report Status 01/02/2018 FINAL  Final   Organism ID, Bacteria ENTEROCOCCUS FAECALIS (A)   Final      Susceptibility   Enterococcus faecalis - MIC*    AMPICILLIN <=2 SENSITIVE Sensitive     LEVOFLOXACIN 1 SENSITIVE Sensitive     NITROFURANTOIN <=16 SENSITIVE Sensitive     VANCOMYCIN 1 SENSITIVE Sensitive     * 40,000 COLONIES/mL ENTEROCOCCUS FAECALIS  Surgical pcr screen     Status: None   Collection Time: 01/01/18  2:08 PM  Result Value Ref Range Status   MRSA, PCR NEGATIVE NEGATIVE Final   Staphylococcus aureus NEGATIVE NEGATIVE Final    Comment: (NOTE) The Xpert SA Assay (FDA approved for NASAL specimens in patients 91 years of age and older), is one component of a comprehensive surveillance program. It is not intended to diagnose infection nor to guide or monitor treatment.   CSF culture     Status: None   Collection Time: 01/01/18  7:37 PM  Result Value Ref Range Status   Specimen Description CSF  Final   Special Requests SHUNT  Final   Gram Stain   Final    WBC PRESENT, PREDOMINANTLY MONONUCLEAR NO ORGANISMS SEEN CYTOSPIN SMEAR    Culture NO GROWTH 3 DAYS  Final   Report Status 01/06/2018 FINAL  Final  Aerobic/Anaerobic Culture (surgical/deep wound)     Status: None   Collection Time: 01/01/18  7:39 PM  Result Value Ref Range Status   Specimen Description WOUND  Final   Special Requests VP SHUNT FROM BRAIN  Final   Gram Stain   Final    ABUNDANT WBC PRESENT,BOTH PMN AND MONONUCLEAR NO ORGANISMS SEEN    Culture   Final    FEW HAEMOPHILUS INFLUENZAE BETA LACTAMASE NEGATIVE NO ANAEROBES ISOLATED CRITICAL RESULT CALLED TO, READ BACK BY AND VERIFIED WITH: A ELLIOTT,RN AT 1623 01/05/18 BY L BENFIELD CONCERNING GROWTH ON CULTURE    Report Status 01/07/2018 FINAL  Final     Studies: Ct Head Wo Contrast  Result Date: 01/08/2018 CLINICAL DATA:  27 year old male. 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 Recent progressive rash along the abdominal course of the shunt. Subsequently admitted with sepsis. Now status post  shunt removal and EVD placement on 01/01/2018. EXAM: CT HEAD WITHOUT CONTRAST TECHNIQUE: Contiguous axial images were obtained from the base of the skull through the vertex without intravenous contrast. COMPARISON:  CT head and neck 12/30/2017 and earlier. FINDINGS: Brain: New right superior frontal approach external ventricular drain. Catheter courses into the anterior right lateral ventricle, tip terminating adjacent to the septum pellucidum. Associated moderate volume pneumoventricle with mild generalized increased ventricular size compared to 12/30/2017. No transependymal edema is evident. No other pneumocephalus. No midline shift, mass effect, evidence of mass lesion, intracranial hemorrhage or evidence of cortically based acute infarction. Stable gray-white matter differentiation throughout the brain. Vascular: No suspicious intracranial vascular hyperdensity. Skull: No acute osseous abnormality identified. The same right frontal approach burr hole or shunt tract has been utilized for the EVD. Sinuses/Orbits: Visualized paranasal sinuses and mastoids are stable and well pneumatized. Other: Right nasoenteric tube in place. Visualized orbit soft tissues are within normal limits. Postoperative changes along the right scalp and visible posterior right neck. Broad-based mild scalp hematoma overlying the right vertex. IMPRESSION: 1. Removed right VP shunt and placement of right EVD via the same skull access. Mild scalp hematoma. 2. Moderate volume pneumo-ventricle and mild generalized increased ventricular size compared to 12/30/2017. No transependymal edema. 3. Otherwise stable noncontrast CT appearance of the brain. Electronically Signed   By: Odessa Fleming M.D.   On: 01/08/2018 12:18   Dg Abd Portable 1v  Result Date: 01/08/2018 CLINICAL DATA:  27 year old male with NG tube placement. EXAM: PORTABLE ABDOMEN - 1 VIEW COMPARISON:  Abdominal radiograph dated 01/04/2018 and CT dated 12/30/2017 FINDINGS: Visualized  distal enteric tube with tip in the gastric fundus. Diffusely air distended loops of small bowel measure up to 6 cm in the mid abdomen. No free air identified on the provided image. No radiopaque calculi. The osseous structures are intact. IMPRESSION: 1. Enteric tube with tip in the gastric fundus. There is persistent dilatation of the small bowel loops. 2. No free air. Electronically Signed   By: Elgie Collard M.D.   On: 01/08/2018 05:12    Scheduled Meds: . Chlorhexidine Gluconate Cloth  6 each Topical Daily  . dolutegravir  50 mg Oral Daily  . emtricitabine-rilpivir-tenofovir AF  1 tablet Oral Q breakfast  . fluconazole  200 mg Oral Daily  . insulin aspart  0-9 Units Subcutaneous Q6H  . mouth rinse  15 mL Mouth Rinse BID  . metoCLOPramide (REGLAN) injection  10 mg Intravenous Q6H  . sodium chloride flush  10-40 mL Intracatheter Q12H  . sulfamethoxazole-trimethoprim  1 tablet Oral Once per day on Mon Wed Fri   Continuous Infusions: . 0.9 % NaCl with KCl 20 mEq / L 100 mL/hr at 01/08/18 1232  . 0.9 % NaCl with KCl 20 mEq / L    .  levETIRAcetam Stopped (01/08/18 0910)  . potassium PHOSPHATE IVPB (mmol) 15 mmol (01/08/18 1232)  . TPN ADULT (ION)    . 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 If 7PM-7AM, please contact night-coverage at [email or web address], password [email or web address] 01/08/2018, 12:44 PM  LOS: 8 days

## 2018-01-08 NOTE — Progress Notes (Signed)
Initial Nutrition Assessment  INTERVENTION:   TPN to meet nutrition needs, per pharmacy to adjust   NUTRITION DIAGNOSIS:   Inadequate oral intake related to inability to eat as evidenced by NPO status.  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 Pt with NG tube on/off clamped. On 1/15 pt vomited large amount per surgery starting SB protocol with NPO x 1 week, IV reglan started, keep K+ >4.0, NGT to LIWS. Plan to start TPN.    Pt discussed during ICU rounds and with RN.  NG tube output 550 ml + one unmeasured NPO x 8 days Per chart review weight has trended up to 165 lb, pt admitted at 180 lb and is now 173 lb  Pt reports no recent weight changes PTA. States weight unknown but clothes fit the same. He had a good appetite PTA.  Breakfast: cereal Lunch: oodles of noodles Dinner: cooked meal at home Does drink Boost every day at home and consumes snacks Pt reports belly feels bigger than usual but is not hurting and no nausea currently with NGT.   Temp (24hrs), Avg:98.7 F (37.1 C), Min:97.9 F (36.6 C), Max:99.6 F (37.6 C)  Medications reviewed and include: SSI, reglan, K+phos x 1 Labs reviewed: K+ 3.5    NUTRITION - FOCUSED PHYSICAL EXAM:    Most Recent Value  Orbital Region  No depletion  Upper Arm Region  No depletion  Thoracic and Lumbar Region  No depletion  Buccal Region  No depletion  Temple Region  Mild depletion  Clavicle Bone Region  Mild depletion  Clavicle and Acromion Bone Region  No depletion  Scapular Bone Region  No depletion  Dorsal Hand  No depletion  Patellar Region  No depletion  Anterior Thigh Region  No depletion  Edema (RD Assessment)  None  Hair  Reviewed   Eyes  Reviewed  Mouth  Reviewed  Skin  Reviewed  Nails  Reviewed       Diet Order:  Diet NPO time specified TPN ADULT (ION) TPN to start this pm at 35 ml/hr  TPN will provide 887 kcal and 49 grams protein    EDUCATION NEEDS:   No education needs have been identified at this time  Skin:  Skin Assessment: Reviewed RN Assessment  Last BM:  1/15  Height:   Ht Readings from Last 1 Encounters:  12/31/17 6\' 1"  (1.854 m)    Weight:   Wt Readings from Last 1 Encounters:  12/31/17 173 lb 11.6 oz (78.8 kg)    Ideal Body Weight:  83.6 kg  BMI:  Body mass index is 22.92 kg/m.  Estimated Nutritional Needs:   Kcal:  2200-2400  Protein:  110-125 grams  Fluid:  > 2 L/day  Kendell BaneHeather Arham Symmonds RD, LDN, CNSC [phone] Pager [phone] After Hours Pager

## 2018-01-09 ENCOUNTER — Inpatient Hospital Stay (HOSPITAL_COMMUNITY): Payer: Medicaid Other

## 2018-01-09 LAB — C DIFFICILE QUICK SCREEN W PCR REFLEX
C DIFFICILE (CDIFF) INTERP: NOT DETECTED
C DIFFICILE (CDIFF) TOXIN: NEGATIVE
C Diff antigen: NEGATIVE

## 2018-01-09 LAB — COMPREHENSIVE METABOLIC PANEL
ALT: 18 U/L (ref 17–63)
AST: 31 U/L (ref 15–41)
Albumin: 2.3 g/dL — ABNORMAL LOW (ref 3.5–5.0)
Alkaline Phosphatase: 69 U/L (ref 38–126)
Anion gap: 8 (ref 5–15)
BILIRUBIN TOTAL: 1.7 mg/dL — AB (ref 0.3–1.2)
BUN: <5 mg/dL — ABNORMAL LOW (ref 6–20)
CALCIUM: 8.4 mg/dL — AB (ref 8.9–10.3)
CO2: 24 mmol/L (ref 22–32)
CREATININE: 0.85 mg/dL (ref 0.61–1.24)
Chloride: 104 mmol/L (ref 101–111)
Glucose, Bld: 119 mg/dL — ABNORMAL HIGH (ref 65–99)
Potassium: 3.3 mmol/L — ABNORMAL LOW (ref 3.5–5.1)
Sodium: 136 mmol/L (ref 135–145)
TOTAL PROTEIN: 7 g/dL (ref 6.5–8.1)

## 2018-01-09 LAB — PREALBUMIN
PREALBUMIN: 8 mg/dL — AB (ref 18–38)
Prealbumin: 8.1 mg/dL — ABNORMAL LOW (ref 18–38)

## 2018-01-09 LAB — CBC WITH DIFFERENTIAL/PLATELET
BASOS ABS: 0 10*3/uL (ref 0.0–0.1)
BLASTS: 0 %
Band Neutrophils: 0 %
Basophils Relative: 0 %
EOS PCT: 0 %
Eosinophils Absolute: 0 10*3/uL (ref 0.0–0.7)
HEMATOCRIT: 31.7 % — AB (ref 39.0–52.0)
Hemoglobin: 10.3 g/dL — ABNORMAL LOW (ref 13.0–17.0)
LYMPHS ABS: 2.1 10*3/uL (ref 0.7–4.0)
Lymphocytes Relative: 12 %
MCH: 31.1 pg (ref 26.0–34.0)
MCHC: 32.5 g/dL (ref 30.0–36.0)
MCV: 95.8 fL (ref 78.0–100.0)
METAMYELOCYTES PCT: 0 %
Monocytes Absolute: 1.4 10*3/uL — ABNORMAL HIGH (ref 0.1–1.0)
Monocytes Relative: 8 %
Myelocytes: 0 %
NEUTROS PCT: 80 %
NRBC: 0 /100{WBCs}
Neutro Abs: 14.3 10*3/uL — ABNORMAL HIGH (ref 1.7–7.7)
Other: 0 %
PLATELETS: 451 10*3/uL — AB (ref 150–400)
Promyelocytes Absolute: 0 %
RBC: 3.31 MIL/uL — AB (ref 4.22–5.81)
RDW: 14.6 % (ref 11.5–15.5)
WBC: 17.8 10*3/uL — AB (ref 4.0–10.5)

## 2018-01-09 LAB — GLUCOSE, CAPILLARY
GLUCOSE-CAPILLARY: 97 mg/dL (ref 65–99)
GLUCOSE-CAPILLARY: 97 mg/dL (ref 65–99)
Glucose-Capillary: 86 mg/dL (ref 65–99)
Glucose-Capillary: 90 mg/dL (ref 65–99)

## 2018-01-09 LAB — MAGNESIUM: MAGNESIUM: 1.6 mg/dL — AB (ref 1.7–2.4)

## 2018-01-09 LAB — PHOSPHORUS: Phosphorus: 3.1 mg/dL (ref 2.5–4.6)

## 2018-01-09 LAB — TRIGLYCERIDES: Triglycerides: 138 mg/dL (ref <150)

## 2018-01-09 MED ORDER — MAGNESIUM SULFATE 2 GM/50ML IV SOLN
2.0000 g | Freq: Once | INTRAVENOUS | Status: AC
  Administered 2018-01-09: 2 g via INTRAVENOUS
  Filled 2018-01-09: qty 50

## 2018-01-09 MED ORDER — POTASSIUM CHLORIDE 10 MEQ/50ML IV SOLN
10.0000 meq | INTRAVENOUS | Status: AC
  Administered 2018-01-09 (×6): 10 meq via INTRAVENOUS
  Filled 2018-01-09 (×2): qty 50

## 2018-01-09 MED ORDER — CHLORHEXIDINE GLUCONATE 0.12 % MT SOLN
15.0000 mL | Freq: Two times a day (BID) | OROMUCOSAL | Status: DC
  Administered 2018-01-09 – 2018-01-17 (×14): 15 mL via OROMUCOSAL
  Filled 2018-01-09 (×12): qty 15

## 2018-01-09 MED ORDER — IOPAMIDOL (ISOVUE-300) INJECTION 61%
INTRAVENOUS | Status: AC
  Administered 2018-01-09: 100 mL
  Filled 2018-01-09: qty 100

## 2018-01-09 MED ORDER — TRAVASOL 10 % IV SOLN
INTRAVENOUS | Status: AC
  Administered 2018-01-09: 17:00:00 via INTRAVENOUS
  Filled 2018-01-09: qty 478.8

## 2018-01-09 MED ORDER — ORAL CARE MOUTH RINSE
15.0000 mL | Freq: Two times a day (BID) | OROMUCOSAL | Status: DC
  Administered 2018-01-09 – 2018-01-14 (×9): 15 mL via OROMUCOSAL

## 2018-01-09 MED ORDER — SODIUM CHLORIDE 0.9 % IV SOLN
3.0000 g | Freq: Four times a day (QID) | INTRAVENOUS | Status: DC
  Administered 2018-01-09 – 2018-01-11 (×7): 3 g via INTRAVENOUS
  Filled 2018-01-09 (×7): qty 3
  Filled 2018-01-09: qty 1.5
  Filled 2018-01-09: qty 3

## 2018-01-09 MED ORDER — IOPAMIDOL (ISOVUE-300) INJECTION 61%
INTRAVENOUS | Status: AC
  Administered 2018-01-09 (×2): 300 mL
  Filled 2018-01-09: qty 30

## 2018-01-09 NOTE — Progress Notes (Addendum)
PAtient arrived to 3W30  From 4N at this time. VSS. Safety precautions and orders reviewed with patient. Isolation initiated. Patient denied any distress. No other concerns noted. Will continue to monitor.   Sim BoastHavy, RN

## 2018-01-09 NOTE — Progress Notes (Signed)
[facility] Progress Note  8 Days Post-Op  Subjective: CC: ileus Patient frustrated with slow progress. Denies pain or nausea. Having watery diarrhea since yesterday.  UOP good. VSS.   Objective: Vital signs in last 24 hours: Temp:  [98 F (36.7 C)-99.8 F (37.7 C)] 99.4 F (37.4 C) (01/16 0800) Pulse Rate:  [64-100] 73 (01/16 0700) Resp:  [16-26] 19 (01/16 0700) BP: (107-135)/(72-97) 122/82 (01/16 0700) SpO2:  [Justin %-100 %] 95 % (01/16 0700) Last BM Date: 01/08/18  Intake/Output from previous day: 01/15 0701 - 01/16 0700 In: 3200.7 [I.V.:2425.7; IV Piggyback:775] Out: 3460 [Urine:2200; Emesis/NG output:1050; Stool:210] Intake/Output this shift: Total I/O In: 150 [IV Piggyback:150] Out: -   PE: Gen: Alert, NAD Card: Regular rate and rhythm, pedal pulses 2+ BL Pulm: Normal effort, clear to auscultation bilaterally Abd: Soft, mildly tender,distended, bowel soundshypoactive, no HSM Skin: warm and dry Psych: A&Ox3     Lab Results:  Recent Labs    01/08/18 0423 01/09/18 0712  WBC 17.8* 17.8*  HGB 11.1* 10.3*  HCT 33.9* 31.7*  PLT 468* 451*   BMET Recent Labs    01/08/18 0423 01/09/18 0712  NA 136 136  K 3.5 3.3*  CL 104 104  CO2 21* 24  GLUCOSE 87 119*  BUN <5* <5*  CREATININE 0.86 0.85  CALCIUM 8.4* 8.4*   PT/INR No results for input(s): LABPROT, INR in the last 72 hours. CMP     Component Value Date/Time   NA 136 01/09/2018 0712   K 3.3 (L) 01/09/2018 0712   CL 104 01/09/2018 0712   CO2 24 01/09/2018 0712   GLUCOSE 119 (H) 01/09/2018 0712   BUN <5 (L) 01/09/2018 0712   CREATININE 0.85 01/09/2018 0712   CREATININE 0.92 09/13/2017 1649   CALCIUM 8.4 (L) 01/09/2018 0712   PROT 7.0 01/09/2018 0712   ALBUMIN 2.3 (L) 01/09/2018 0712   AST 31 01/09/2018 0712   ALT 18 01/09/2018 0712   ALT 38 07/10/2017 1152   ALKPHOS 69 01/09/2018 0712   BILITOT 1.7 (H) 01/09/2018 0712   GFRNONAA >60 01/09/2018 0712   GFRNONAA 114 09/13/2017  1649   GFRAA >60 01/09/2018 0712   GFRAA 133 09/13/2017 1649   Lipase     Component Value Date/Time   LIPASE 16 05/18/2017 1941       Studies/Results: Ct Head Wo Contrast  Result Date: 01/08/2018 CLINICAL DATA:  27 year old Ball. 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 Recent progressive rash along the abdominal course of the shunt. Subsequently admitted with sepsis. Now status post shunt removal and EVD placement on 01/01/2018. EXAM: CT HEAD WITHOUT CONTRAST TECHNIQUE: Contiguous axial images were obtained from the base of the skull through the vertex without intravenous contrast. COMPARISON:  CT head and neck 12/30/2017 and earlier. FINDINGS: Brain: New right superior frontal approach external ventricular drain. Catheter courses into the anterior right lateral ventricle, tip terminating adjacent to the septum pellucidum. Associated moderate volume pneumoventricle with mild generalized increased ventricular size compared to 12/30/2017. No transependymal edema is evident. No other pneumocephalus. No midline shift, mass effect, evidence of mass lesion, intracranial hemorrhage or evidence of cortically based acute infarction. Stable gray-white matter differentiation throughout the brain. Vascular: No suspicious intracranial vascular hyperdensity. Skull: No acute osseous abnormality identified. The same right frontal approach burr hole or shunt tract has been utilized for the EVD. Sinuses/Orbits: Visualized paranasal sinuses and mastoids are stable and well pneumatized. Other: Right nasoenteric tube in place. Visualized  orbit soft tissues are within normal limits. Postoperative changes along the right scalp and visible posterior right neck. Broad-based mild scalp hematoma overlying the right vertex. IMPRESSION: 1. Removed right VP shunt and placement of right EVD via the same skull access. Mild scalp hematoma. 2. Moderate volume pneumo-ventricle and mild  generalized increased ventricular size compared to 12/30/2017. No transependymal edema. 3. Otherwise stable noncontrast CT appearance of the brain. Electronically Signed   By: Odessa FlemingH  Hall M.D.   On: 01/08/2018 12:18   Dg Abd Portable 1v-small Bowel Obstruction Protocol-initial, 8 Hr Delay  Result Date: 01/08/2018 CLINICAL DATA:  Follow-up small bowel obstruction EXAM: PORTABLE ABDOMEN - 1 VIEW COMPARISON:  Abdominal radiographs from earlier today FINDINGS: There are persistent markedly dilated small bowel loops throughout the abdomen measuring up to the 5.3 cm diameter, not appreciably changed. Dilute oral contrast is noted throughout the large bowel and rectum. No evidence of pneumatosis or pneumoperitoneum. IMPRESSION: Persistent prominently dilated small bowel loops throughout the abdomen, unchanged. Dilute oral contrast is present throughout the large bowel and rectum. Findings suggest adynamic ileus or partial distal small bowel obstruction. Electronically Signed   By: Delbert PhenixJason A Poff M.D.   On: 01/08/2018 22:25   Dg Abd Portable 1v  Result Date: 01/08/2018 CLINICAL DATA:  27 year old Ball with NG tube placement. EXAM: PORTABLE ABDOMEN - 1 VIEW COMPARISON:  Abdominal radiograph dated 01/04/2018 and CT dated 12/30/2017 FINDINGS: Visualized distal enteric tube with tip in the gastric fundus. Diffusely air distended loops of small bowel measure up to 6 cm in the mid abdomen. No free air identified on the provided image. No radiopaque calculi. The osseous structures are intact. IMPRESSION: 1. Enteric tube with tip in the gastric fundus. There is persistent dilatation of the small bowel loops. 2. No free air. Electronically Signed   By: Elgie CollardArash  Radparvar M.D.   On: 01/08/2018 05:12    Anti-infectives: Anti-infectives (From admission, onward)   Start     Dose/Rate Route Frequency Ordered Stop   01/07/18 1314  vancomycin (VANCOCIN) IVPB 750 mg/150 ml premix     750 mg 150 mL/hr over 60 Minutes Intravenous  Every 8 hours 01/07/18 1314     01/05/18 0500  vancomycin (VANCOCIN) IVPB 1000 mg/200 mL premix  Status:  Discontinued     1,000 mg 200 mL/hr over 60 Minutes Intravenous Every 8 hours 01/04/18 1349 01/07/18 1314   01/04/18 1000  fluconazole (DIFLUCAN) tablet 200 mg     200 mg Oral Daily 01/04/18 0844     01/03/18 1000  fluconazole (DIFLUCAN) IVPB 200 mg  Status:  Discontinued     200 mg 100 mL/hr over 60 Minutes Intravenous Every 24 hours 01/02/18 1525 01/04/18 0844   01/02/18 1100  vancomycin (VANCOCIN) 1,250 mg in sodium chloride 0.9 % 250 mL IVPB  Status:  Discontinued     1,250 mg 166.7 mL/hr over 90 Minutes Intravenous Every 8 hours 01/02/18 1025 01/04/18 1137   01/02/18 0900  sulfamethoxazole-trimethoprim (BACTRIM DS,SEPTRA DS) 800-160 MG per tablet 1 tablet     1 tablet Oral Once per day on Mon Wed Fri 12/31/17 1003     01/01/18 0930  fluconazole (DIFLUCAN) IVPB 400 mg  Status:  Discontinued     400 mg 100 mL/hr over 120 Minutes Intravenous Every 24 hours 01/01/18 0830 01/02/18 1525   12/31/17 2200  vancomycin (VANCOCIN) IVPB 1000 mg/200 mL premix  Status:  Discontinued     1,000 mg 200 mL/hr over 60 Minutes Intravenous Every 8 hours  12/31/17 2048 01/02/18 1025   12/31/17 1400  cefTAZidime (FORTAZ) 2 g in dextrose 5 % 50 mL IVPB  Status:  Discontinued     2 g 100 mL/hr over 30 Minutes Intravenous Every 8 hours 12/31/17 1119 01/01/18 0808   12/31/17 1200  dolutegravir (TIVICAY) tablet 50 mg     50 mg Oral Daily 12/31/17 0207     12/31/17 1200  emtricitabine-rilpivir-tenofovir AF (ODEFSEY) 200-25-25 MG per tablet 1 tablet     1 tablet Oral Daily with breakfast 12/31/17 0207     12/31/17 1100  fluconazole (DIFLUCAN) tablet 400 mg  Status:  Discontinued     400 mg Oral Daily 12/31/17 1003 01/01/18 0829   12/31/17 1000  vancomycin (VANCOCIN) IVPB 1000 mg/200 mL premix  Status:  Discontinued     1,000 mg 200 mL/hr over 60 Minutes Intravenous 2 times daily 12/31/17 0444 12/31/17 2048    12/31/17 0215  sulfamethoxazole-trimethoprim (BACTRIM DS,SEPTRA DS) 800-160 MG per tablet 1 tablet  Status:  Discontinued     1 tablet Oral 2 times daily 12/31/17 0207 12/31/17 1003   12/31/17 0215  cefTRIAXone (ROCEPHIN) 2 g in dextrose 5 % 50 mL IVPB  Status:  Discontinued     2 g 100 mL/hr over 30 Minutes Intravenous Every 24 hours 12/31/17 0207 12/31/17 1119   12/30/17 2345  vancomycin (VANCOCIN) 1,500 mg in sodium chloride 0.9 % 500 mL IVPB     1,500 mg 250 mL/hr over 120 Minutes Intravenous  Once 12/30/17 2331 12/31/17 0324   12/30/17 2330  cefTRIAXone (ROCEPHIN) 2 g in dextrose 5 % 50 mL IVPB     2 g 100 mL/hr over 30 Minutes Intravenous  Once 12/30/17 2328 12/31/17 0122       Assessment/Plan Infected VP shunt s/p removal Ileus - NGT on LIWS with >1L bilious output - KUB still showing dilated small bowel - CT ordered  - patient having watery diarrhea - check C.Diff - reglan 10 mg IV q6h Hypokalemia - K 3.3, replace to >4.0 in setting of ileus  FEN: NPO, IVF; NGT to LIWS; this will be 1 week without PO intake, start TPN VTE: SCDs ID: vanc 1/12>>, PO bactrim 1/9>>, PO diflucan 1/9>>  TPN started yesterday. Check C.Diff. CT abdomen pelvis today.    LOS: 9 days    Wells Guiles , [facility] 01/09/2018, 8:58 AM Pager: [phone] Consults: [phone] Mon-Fri 7:00 am-4:30 pm Sat-Sun 7:00 am-11:30 am

## 2018-01-09 NOTE — Progress Notes (Signed)
PHARMACY - ADULT TOTAL PARENTERAL NUTRITION CONSULT NOTE   Pharmacy Consult:  TPN Indication:  Ileus  Patient Measurements: Height: 6\' 1"  (185.4 cm) Weight: 173 lb 11.6 oz (78.8 kg) IBW/kg (Calculated) : 79.9 TPN AdjBW (KG): 78.8 Body mass index is 22.92 kg/m.  Assessment:  [age] YOM with history of neurogenic bowel presented on 12/31/17 with fever and rash.  He was also having nausea and vomiting with hypoactive bowel sounds and distended abdomen on 01/01/18.  Diagnosed with ileus secondary to infected VP shunt.  Patient tolerated intermittent clamping of NG tube and consumed some clear liquids.  On 01/08/18, he vomited a large amount of green/clear bile.  Pharmacy consulted to manage TPN.  Patient reports no recent weight loss/gain and he was eating regularly PTA.  GI: baseline prealbumin low at 8, NG O/P up to 1050mL, stool O/P 210mL - Reglan IV Endo: no hx DM - CBGs low normal Insulin requirements in the past 24 hours: 0 unit SSI since TPN started Lytes: ?mild refeeding - K+ 3.3 (goal >/= 4 for ileus), Mag down 1.6 (goal >/= 2 for ileus), others WNL Renal: neurogenic bladder - SCr 0.856 stable, BUN <5.  UOP 1.2 ml/kg/hr, NS 20K at 65 ml/hr Pulm: stable on RA Cards: VSS Hepatobil: Hep B - LFTs WNL, tbili mildly elevated at 1.7.  TG WNL. Neuro: Bipolar / depression / CVA / seizure - Keppra IV, PRN APAP/morphine/Seroquel ID: Vanc (1/7 >> ) for CoNS bacteremia + VP shunt infxn.  Afebrile, WBC down 17.8 AIDS - dolutegravir, Odefsey, Septra/Fluc Hx cryptococcal meningitis s/p VP shunt >> removed and EVD replaced 1/9 >> removed 1/15 TPN Access: PICC placed 01/08/18 TPN start date: 01/08/18  Nutritional Goals (per RD rec 1/15): 2200-2400 kCal and 110-125 gm protein per day  Current Nutrition:  TPN   Plan:  Continue TPN at 35 ml/hr (goal ~80 ml/hr), providing 49g AA, 143g CHO and 21g ILE, which equals to 887 kCal, meeting ~45% of needs.  Advance when lytes improve. Electrolytes in TPN:  increase Na/K/Mag 1/16, no change to Phos, Cl:Ac 1:2 Daily multivitamin and trace elements in TPN Continue NS 20K at 65 ml/hr  Continue sensitive SSI Q6H.  D/C if CBGs remain controlled at goal TPN rate. KCL x 6 runs Mag sulfate 2gm IV x 1 F/U AM labs   Charliegh Vasudevan D. Laney Potashang, PharmD, BCPS Pager:  [phone] 01/09/2018, 9:02 AM

## 2018-01-09 NOTE — Progress Notes (Signed)
No issues overnight. EVD removed yesterday afternoon.  EXAM:  BP 122/82   Pulse 73   Temp 99.4 F (37.4 C) (Oral)   Resp 19   Ht $R [id number]$6\' 1"%   BMI 22.92 kg/m   Awake, alert, oriented  Speech fluent, appropriate  CN grossly intact  5/5 BUE/BLE   IMPRESSION:  27 y.o. male with VPS infection and ileus. Does not appear to be shunt dependent any longer, EVD removed yesterday with stable exam.  PLAN: - Stable for transfer back to floor - Cont mgmt per ID/Gen surg

## 2018-01-09 NOTE — Progress Notes (Signed)
Physical Therapy Treatment Patient Details Name: Justin Ball MRN: [id number] DOB: 31-Mar-1991 Today's Date: 01/09/2018    History of Present Illness 27 y.o. male with medical history significant of AIDS, syphillis, substance abuse, bipolar disorder, crytococcal meningitis last year s/p VP shunt placement admitted with shunt infection s/p removal with IVC placed and ileus acutely    PT Comments    Pt with increased difficulty with transfers and ambulation this date due to increased lethargy. Per RN and patient he's been sleeping all day. Pt requiring more assist. Pt motivated and desired to work with PT however session limited today by disconnection of rectal tube. Acute PT to con't to follow.   Follow Up Recommendations  No PT follow up;Supervision/Assistance - 24 hour     Equipment Recommendations  None recommended by PT    Recommendations for Other Services       Precautions / Restrictions Precautions Precautions: Fall Precaution Comments: NGT  Restrictions Weight Bearing Restrictions: No    Mobility  Bed Mobility Overal bed mobility: Needs Assistance Bed Mobility: Supine to Sit     Supine to sit: Supervision     General bed mobility comments: pt pulled self to long sit and attempted to don socks, pt brough self to EOB  Transfers Overall transfer level: Needs assistance   Transfers: Sit to/from Stand Sit to Stand: Min assist;Mod assist         General transfer comment: pt more unsteady today requiring modA as pt with increased lean to the R, vc's to increase base of support  Ambulation/Gait Ambulation/Gait assistance: Min assist;Mod assist Ambulation Distance (Feet): 100 Feet Assistive device: (pushed IV pole) Gait Pattern/deviations: Step-through pattern;Decreased stride length Gait velocity: slow Gait velocity interpretation: Below normal speed for age/gender General Gait Details: pt pushed IV pole with R hand, with onset of fatigue pt with increased L  lateral lean into PT, pt with narrowed base of support with short shuffled steps, distance limited due to rectal tube becoming disconnected and liquid stool leaking all over the floor   Stairs            Wheelchair Mobility    Modified Rankin (Stroke Patients Only)       Balance Overall balance assessment: Needs assistance Sitting-balance support: Feet unsupported Sitting balance-Leahy Scale: Fair Sitting balance - Comments: pt able to attempt to don socks   Standing balance support: No upper extremity supported Standing balance-Leahy Scale: Fair Standing balance comment: pt stood x 5-6 min for hygiene sp loose stool with min guard                            Cognition Arousal/Alertness: Awake/alert Behavior During Therapy: Flat affect Overall Cognitive Status: Impaired/Different from baseline Area of Impairment: Problem solving                   Current Attention Level: Sustained   Following Commands: Follows multi-step commands with increased time     Problem Solving: Slow processing;Decreased initiation;Requires verbal cues;Difficulty sequencing;Requires tactile cues General Comments: increased time for response but able to follow commands      Exercises      General Comments        Pertinent Vitals/Pain Pain Assessment: No/denies pain    Home Living                      Prior Function  PT Goals (current goals can now be found in the care plan section) Progress towards PT goals: Not progressing toward goals - comment    Frequency    Min 3X/week      PT Plan Current plan remains appropriate    Co-evaluation              AM-PAC PT "6 Clicks" Daily Activity  Outcome Measure  Difficulty turning over in bed (including adjusting bedclothes, sheets and blankets)?: A Little Difficulty moving from lying on back to sitting on the side of the bed? : A Little Difficulty sitting down on and standing up  from a chair with arms (e.g., wheelchair, bedside commode, etc,.)?: A Little Help needed moving to and from a bed to chair (including a wheelchair)?: A Lot Help needed walking in hospital room?: A Lot Help needed climbing 3-5 steps with a railing? : A Lot 6 Click Score: 15    End of Session Equipment Utilized During Treatment: Gait belt Activity Tolerance: Patient tolerated treatment well Patient left: in chair;with call bell/phone within reach;with chair alarm set;with family/visitor present;with nursing/sitter in room Nurse Communication: Mobility status PT Visit Diagnosis: Other abnormalities of gait and mobility (R26.89)     Time: 1349-1415 PT Time Calculation (min) (ACUTE ONLY): 26 min  Charges:  $Gait Training: 8-22 mins $Therapeutic Activity: 8-22 mins                    G Codes:       Lewis ShockAshly Partick Musselman, PT, DPT Pager #: [phone] Office #: [phone]    Kallen Mccrystal M Searra Carnathan 01/09/2018, 2:49 PM

## 2018-01-09 NOTE — Progress Notes (Addendum)
[facility] PROGRESS NOTE  Ola SpurrLinston R Coulson WUJ:[id number]:[id number] DOB: 01/24/1991 DOA: 12/30/2017 PCP: No primary care provider on file.  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 Patient was seen by the PCP several days prior to admission with history of rash along his abd wall where his VP shunt was. Patient was initially placed on bactrim. The rash got worse and was tracking along the VP shunt location (under his skin along his chest wall). Patient was admitted with sepsis, and VP shunt infection. Patient has undergone VP shunt removal and placement of EVD (on 01/01/18) by Neurosurgery. Patient also developed ileus secondary to VP shunt infection.    Assessment/Plan:  Sepsis. likely related to shunt infection. Presented with fever, leukocytosis, tachycardia, tachypnea.  Neurosurgery consulted. Patient underwent  VP shunt removal, placement of EVD on 1-08. Blood culture grew Staph Epi and Strept Pneumonia. CSF culture: NGTD.  -improved IV Vanco, as well as Medication for HIV and Prophylaxis (Bactrim and fluconazole). Cont antibiotics per ID. Appreciate ID, neurosurgery input  plan for one more day of vancomycin per ID.   Ileus. Abdominal Pain; Nausea, vomiting. KUB with sign of SBO vs ileus.  -NGT. General surgery following.  -still with out put form NG tube.  Sx recommend CT abdomen pelvis. CT abdomen with intra-abdominal abscess. Sx informed of CT results, they  will reviewed. I will add Unasyn to cover for gram negative. ID will follow up in consultation tomorrow.   AIDS: Continue with Tivicay, Odefsey. ID following. On Fluconazole for history of cryptococcus Meningitis.   Seizure: Continue with Keppra. Change to IV due to NPO.   Abnormal electrolytes. Cont replacing , monitor labs   S/P VP Shunt. Neurosurgery has been consulted due to infection. He had VP shunt for cryptococcus meningitis in the  past  -VP shunt has been removed by neurosurgery. EVD discontinued on 11/15. Cont per neurosurgery.  -CT head stable per neurosurgery   Chronic viral hepatitis. Bipolar; on Seroquel.     Code Status: full Family Communication: d/w patient, Disposition Plan: pend clinical improvement    Consultants:  ID  neurosurg  Procedures:  VP shunt removal   Antibiotics: Anti-infectives (From admission, onward)   Start     Dose/Rate Route Frequency Ordered Stop   01/07/18 1314  vancomycin (VANCOCIN) IVPB 750 mg/150 ml premix     750 mg 150 mL/hr over 60 Minutes Intravenous Every 8 hours 01/07/18 1314     01/05/18 0500  vancomycin (VANCOCIN) IVPB 1000 mg/200 mL premix  Status:  Discontinued     1,000 mg 200 mL/hr over 60 Minutes Intravenous Every 8 hours 01/04/18 1349 01/07/18 1314   01/04/18 1000  fluconazole (DIFLUCAN) tablet 200 mg     200 mg Oral Daily 01/04/18 0844     01/03/18 1000  fluconazole (DIFLUCAN) IVPB 200 mg  Status:  Discontinued     200 mg 100 mL/hr over 60 Minutes Intravenous Every 24 hours 01/02/18 1525 01/04/18 0844   01/02/18 1100  vancomycin (VANCOCIN) 1,250 mg in sodium chloride 0.9 % 250 mL IVPB  Status:  Discontinued     1,250 mg 166.7 mL/hr over 90 Minutes Intravenous Every 8 hours 01/02/18 1025 01/04/18 1137   01/02/18 0900  sulfamethoxazole-trimethoprim (BACTRIM DS,SEPTRA DS) 800-160 MG per tablet 1 tablet     1 tablet Oral Once per day on Mon Wed Fri 12/31/17 1003     01/01/18 0930  fluconazole (DIFLUCAN) IVPB 400  mg  Status:  Discontinued     400 mg 100 mL/hr over 120 Minutes Intravenous Every 24 hours 01/01/18 0830 01/02/18 1525   12/31/17 2200  vancomycin (VANCOCIN) IVPB 1000 mg/200 mL premix  Status:  Discontinued     1,000 mg 200 mL/hr over 60 Minutes Intravenous Every 8 hours 12/31/17 2048 01/02/18 1025   12/31/17 1400  cefTAZidime (FORTAZ) 2 g in dextrose 5 % 50 mL IVPB  Status:  Discontinued     2 g 100 mL/hr over 30 Minutes Intravenous  Every 8 hours 12/31/17 1119 01/01/18 0808   12/31/17 1200  dolutegravir (TIVICAY) tablet 50 mg     50 mg Oral Daily 12/31/17 0207     12/31/17 1200  emtricitabine-rilpivir-tenofovir AF (ODEFSEY) 200-25-25 MG per tablet 1 tablet     1 tablet Oral Daily with breakfast 12/31/17 0207     12/31/17 1100  fluconazole (DIFLUCAN) tablet 400 mg  Status:  Discontinued     400 mg Oral Daily 12/31/17 1003 01/01/18 0829   12/31/17 1000  vancomycin (VANCOCIN) IVPB 1000 mg/200 mL premix  Status:  Discontinued     1,000 mg 200 mL/hr over 60 Minutes Intravenous 2 times daily 12/31/17 0444 12/31/17 2048   12/31/17 0215  sulfamethoxazole-trimethoprim (BACTRIM DS,SEPTRA DS) 800-160 MG per tablet 1 tablet  Status:  Discontinued     1 tablet Oral 2 times daily 12/31/17 0207 12/31/17 1003   12/31/17 0215  cefTRIAXone (ROCEPHIN) 2 g in dextrose 5 % 50 mL IVPB  Status:  Discontinued     2 g 100 mL/hr over 30 Minutes Intravenous Every 24 hours 12/31/17 0207 12/31/17 1119   12/30/17 2345  vancomycin (VANCOCIN) 1,500 mg in sodium chloride 0.9 % 500 mL IVPB     1,500 mg 250 mL/hr over 120 Minutes Intravenous  Once 12/30/17 2331 12/31/17 0324   12/30/17 2330  cefTRIAXone (ROCEPHIN) 2 g in dextrose 5 % 50 mL IVPB     2 g 100 mL/hr over 30 Minutes Intravenous  Once 12/30/17 2328 12/31/17 0122       (indicate start date, and stop date if known)  HPI/Subjective: Alert , denies worsening abdominal pain.   Objective: Vitals:   01/09/18 1400 01/09/18 1650  BP: (!) 159/141 111/73  Pulse: (!) 119 85  Resp: (!) 28   Temp:  97.8 F (36.6 C)  SpO2: 96% 98%    Intake/Output Summary (Last 24 hours) at 01/09/2018 1713 Last data filed at 01/09/2018 1500 Gross per 24 hour  Intake 3600.67 ml  Output 4810 ml  Net -1209.33 ml   Filed Weights   12/30/17 1808 12/31/17 0758  Weight: 81.6 kg (180 lb) 78.8 kg (173 lb 11.6 oz)    Exam:   General:  No acute distress. NG tube in place.   Cardiovascular: S 1, S 2  RRR  Respiratory: CTA  Abdomen: Soft, nt.   Musculoskeletal: no edema  Data Reviewed: Basic Metabolic Panel: Recent Labs  Lab 01/03/18 1629 01/04/18 0956 01/05/18 0305 01/06/18 0338 01/06/18 2244 01/08/18 0423 01/09/18 0712  NA 143 142 141 138 136 136 136  K 3.1* 3.2* 3.0* 2.9* 3.2* 3.5 3.3*  CL 109 109 108 105 105 104 104  CO2 25 26 24 24 22  21* 24  GLUCOSE 93 94 87 89 83 87 119*  BUN 17 11 6  <5* <5* <5* <5*  CREATININE 0.91 0.99 0.94 0.70 0.84 0.86 0.85  CALCIUM 8.1* 8.1* 8.2* 8.1* 8.2* 8.4* 8.4*  MG 1.9  --   --  2.0  --   --  1.6*  PHOS 2.9 2.6 3.6 2.7 2.8  --  3.1   Liver Function Tests: Recent Labs  Lab 01/04/18 0956 01/05/18 0305 01/06/18 0338 01/06/18 2244 01/09/18 0712  AST  --   --   --   --  31  ALT  --   --   --   --  18  ALKPHOS  --   --   --   --  69  BILITOT  --   --   --   --  1.7*  PROT  --   --   --   --  7.0  ALBUMIN 2.2* 2.3* 2.1* 2.2* 2.3*   No results for input(s): LIPASE, AMYLASE in the last 168 hours. No results for input(s): AMMONIA in the last 168 hours. CBC: Recent Labs  Lab 01/03/18 1629 01/04/18 0513 01/05/18 0305 01/06/18 0338 01/08/18 0423 01/09/18 0712  WBC 21.2* 14.7* 22.3* 24.9* 17.8* 17.8*  NEUTROABS 19.0* 13.7* 18.6* 22.4*  --  14.3*  HGB 10.4* 10.4* 10.9* 10.3* 11.1* 10.3*  HCT 32.2* 31.9* 33.7* 31.7* 33.9* 31.7*  MCV 94.7 96.4 96.6 95.5 95.2 95.8  PLT 363 355 400 432* 468* 451*   Cardiac Enzymes: No results for input(s): CKTOTAL, CKMB, CKMBINDEX, TROPONINI in the last 168 hours. BNP (last 3 results) No results for input(s): BNP in the last 8760 hours.  ProBNP (last 3 results) No results for input(s): PROBNP in the last 8760 hours.  CBG: Recent Labs  Lab 01/08/18 1212 01/08/18 1834 01/09/18 0003 01/09/18 0626 01/09/18 1149  GLUCAP 79 86 90 97 97    Recent Results (from the past 240 hour(s))  Culture, blood (Routine x 2)     Status: Abnormal   Collection Time: 12/30/17  6:30 PM  Result Value Ref  Range Status   Specimen Description BLOOD RIGHT ANTECUBITAL  Final   Special Requests   Final    BOTTLES DRAWN AEROBIC AND ANAEROBIC Blood Culture adequate volume   Culture  Setup Time   Final    GRAM POSITIVE COCCI ANAEROBIC BOTTLE ONLY CRITICAL VALUE NOTED.  VALUE IS CONSISTENT WITH PREVIOUSLY REPORTED AND CALLED VALUE.    Culture STAPHYLOCOCCUS EPIDERMIDIS (A)  Final   Report Status 01/04/2018 FINAL  Final   Organism ID, Bacteria STAPHYLOCOCCUS EPIDERMIDIS  Final      Susceptibility   Staphylococcus epidermidis - MIC*    CIPROFLOXACIN <=0.5 SENSITIVE Sensitive     ERYTHROMYCIN 0.5 SENSITIVE Sensitive     GENTAMICIN <=0.5 SENSITIVE Sensitive     OXACILLIN <=0.25 SENSITIVE Sensitive     TETRACYCLINE <=1 SENSITIVE Sensitive     VANCOMYCIN <=0.5 SENSITIVE Sensitive     TRIMETH/SULFA <=10 SENSITIVE Sensitive     CLINDAMYCIN <=0.25 SENSITIVE Sensitive     RIFAMPIN <=0.5 SENSITIVE Sensitive     Inducible Clindamycin NEGATIVE Sensitive     * STAPHYLOCOCCUS EPIDERMIDIS  Culture, blood (Routine x 2)     Status: Abnormal   Collection Time: 12/30/17  6:40 PM  Result Value Ref Range Status   Specimen Description BLOOD LEFT ANTECUBITAL  Final   Special Requests   Final    BOTTLES DRAWN AEROBIC AND ANAEROBIC Blood Culture adequate volume   Culture  Setup Time   Final    GRAM POSITIVE COCCI IN CLUSTERS IN BOTH AEROBIC AND ANAEROBIC BOTTLES CRITICAL RESULT CALLED TO, READ BACK BY AND VERIFIED WITH: A. MYER PHARMD, AT 2021 12/31/17 BY D. VANHOOK    Culture (A)  Final    STAPHYLOCOCCUS EPIDERMIDIS SUSCEPTIBILITIES PERFORMED ON PREVIOUS CULTURE WITHIN THE LAST 5 DAYS.    Report Status 01/04/2018 FINAL  Final  Blood Culture ID Panel (Reflexed)     Status: Abnormal   Collection Time: 12/30/17  6:40 PM  Result Value Ref Range Status   Enterococcus species NOT DETECTED NOT DETECTED Final   Listeria monocytogenes NOT DETECTED NOT DETECTED Final   Staphylococcus species DETECTED (A) NOT DETECTED  Final    Comment: Methicillin (oxacillin) resistant coagulase negative staphylococcus. Possible blood culture contaminant (unless isolated from more than one blood culture draw or clinical case suggests pathogenicity). No antibiotic treatment is indicated for blood  culture contaminants. CRITICAL RESULT CALLED TO, READ BACK BY AND VERIFIED WITH: A. MYER PHARMD, AT 2021 12/31/17 BY D. VANHOOK    Staphylococcus aureus NOT DETECTED NOT DETECTED Final   Methicillin resistance DETECTED (A) NOT DETECTED Final    Comment: CRITICAL RESULT CALLED TO, READ BACK BY AND VERIFIED WITH: A. MYER PHARMD, AT 2021 12/31/17 BY D. VANHOOK    Streptococcus species NOT DETECTED NOT DETECTED Final   Streptococcus agalactiae NOT DETECTED NOT DETECTED Final   Streptococcus pneumoniae NOT DETECTED NOT DETECTED Final   Streptococcus pyogenes NOT DETECTED NOT DETECTED Final   Acinetobacter baumannii NOT DETECTED NOT DETECTED Final   Enterobacteriaceae species NOT DETECTED NOT DETECTED Final   Enterobacter cloacae complex NOT DETECTED NOT DETECTED Final   Escherichia coli NOT DETECTED NOT DETECTED Final   Klebsiella oxytoca NOT DETECTED NOT DETECTED Final   Klebsiella pneumoniae NOT DETECTED NOT DETECTED Final   Proteus species NOT DETECTED NOT DETECTED Final   Serratia marcescens NOT DETECTED NOT DETECTED Final   Haemophilus influenzae NOT DETECTED NOT DETECTED Final   Neisseria meningitidis NOT DETECTED NOT DETECTED Final   Pseudomonas aeruginosa NOT DETECTED NOT DETECTED Final   Candida albicans NOT DETECTED NOT DETECTED Final   Candida glabrata NOT DETECTED NOT DETECTED Final   Candida krusei NOT DETECTED NOT DETECTED Final   Candida parapsilosis NOT DETECTED NOT DETECTED Final   Candida tropicalis NOT DETECTED NOT DETECTED Final  MRSA PCR Screening     Status: None   Collection Time: 12/31/17  8:38 AM  Result Value Ref Range Status   MRSA by PCR NEGATIVE NEGATIVE Final    Comment:        The GeneXpert MRSA  Assay (FDA approved for NASAL specimens only), is one component of a comprehensive MRSA colonization surveillance program. It is not intended to diagnose MRSA infection nor to guide or monitor treatment for MRSA infections.   Urine Culture     Status: Abnormal   Collection Time: 12/31/17 10:54 AM  Result Value Ref Range Status   Specimen Description URINE, CLEAN CATCH  Final   Special Requests NONE  Final   Culture 40,000 COLONIES/mL ENTEROCOCCUS FAECALIS (A)  Final   Report Status 01/02/2018 FINAL  Final   Organism ID, Bacteria ENTEROCOCCUS FAECALIS (A)  Final      Susceptibility   Enterococcus faecalis - MIC*    AMPICILLIN <=2 SENSITIVE Sensitive     LEVOFLOXACIN 1 SENSITIVE Sensitive     NITROFURANTOIN <=16 SENSITIVE Sensitive     VANCOMYCIN 1 SENSITIVE Sensitive     * 40,000 COLONIES/mL ENTEROCOCCUS FAECALIS  Surgical pcr screen     Status: None   Collection Time: 01/01/18  2:08 PM  Result Value Ref Range Status   MRSA, PCR NEGATIVE NEGATIVE Final   Staphylococcus aureus NEGATIVE NEGATIVE Final  Comment: (NOTE) The Xpert SA Assay (FDA approved for NASAL specimens in patients 20 years of age and older), is one component of a comprehensive surveillance program. It is not intended to diagnose infection nor to guide or monitor treatment.   CSF culture     Status: None   Collection Time: 01/01/18  7:37 PM  Result Value Ref Range Status   Specimen Description CSF  Final   Special Requests SHUNT  Final   Gram Stain   Final    WBC PRESENT, PREDOMINANTLY MONONUCLEAR NO ORGANISMS SEEN CYTOSPIN SMEAR    Culture NO GROWTH 3 DAYS  Final   Report Status 01/06/2018 FINAL  Final  Aerobic/Anaerobic Culture (surgical/deep wound)     Status: None   Collection Time: 01/01/18  7:39 PM  Result Value Ref Range Status   Specimen Description WOUND  Final   Special Requests VP SHUNT FROM BRAIN  Final   Gram Stain   Final    ABUNDANT WBC PRESENT,BOTH PMN AND MONONUCLEAR NO ORGANISMS  SEEN    Culture   Final    FEW HAEMOPHILUS INFLUENZAE BETA LACTAMASE NEGATIVE NO ANAEROBES ISOLATED CRITICAL RESULT CALLED TO, READ BACK BY AND VERIFIED WITH: A ELLIOTT,RN AT 1623 01/05/18 BY L BENFIELD CONCERNING GROWTH ON CULTURE    Report Status 01/07/2018 FINAL  Final  C difficile quick scan w PCR reflex     Status: None   Collection Time: 01/09/18  9:36 AM  Result Value Ref Range Status   C Diff antigen NEGATIVE NEGATIVE Final   C Diff toxin NEGATIVE NEGATIVE Final   C Diff interpretation No C. difficile detected.  Final     Studies: Ct Head Wo Contrast  Result Date: 01/08/2018 CLINICAL DATA:  27 year old male. 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 Recent progressive rash along the abdominal course of the shunt. Subsequently admitted with sepsis. Now status post shunt removal and EVD placement on 01/01/2018. EXAM: CT HEAD WITHOUT CONTRAST TECHNIQUE: Contiguous axial images were obtained from the base of the skull through the vertex without intravenous contrast. COMPARISON:  CT head and neck 12/30/2017 and earlier. FINDINGS: Brain: New right superior frontal approach external ventricular drain. Catheter courses into the anterior right lateral ventricle, tip terminating adjacent to the septum pellucidum. Associated moderate volume pneumoventricle with mild generalized increased ventricular size compared to 12/30/2017. No transependymal edema is evident. No other pneumocephalus. No midline shift, mass effect, evidence of mass lesion, intracranial hemorrhage or evidence of cortically based acute infarction. Stable gray-white matter differentiation throughout the brain. Vascular: No suspicious intracranial vascular hyperdensity. Skull: No acute osseous abnormality identified. The same right frontal approach burr hole or shunt tract has been utilized for the EVD. Sinuses/Orbits: Visualized paranasal sinuses and mastoids are stable and well pneumatized.  Other: Right nasoenteric tube in place. Visualized orbit soft tissues are within normal limits. Postoperative changes along the right scalp and visible posterior right neck. Broad-based mild scalp hematoma overlying the right vertex. IMPRESSION: 1. Removed right VP shunt and placement of right EVD via the same skull access. Mild scalp hematoma. 2. Moderate volume pneumo-ventricle and mild generalized increased ventricular size compared to 12/30/2017. No transependymal edema. 3. Otherwise stable noncontrast CT appearance of the brain. Electronically Signed   By: Odessa Fleming M.D.   On: 01/08/2018 12:18   Ct Abdomen Pelvis W Contrast  Result Date: 01/09/2018 CLINICAL DATA:  Evaluate ileus. New onset diarrhea. No pain or nausea. EXAM: CT ABDOMEN AND PELVIS WITH CONTRAST  TECHNIQUE: Multidetector CT imaging of the abdomen and pelvis was performed using the standard protocol following bolus administration of intravenous contrast. CONTRAST:  ISOVUE-300 IOPAMIDOL (ISOVUE-300) INJECTION 61% COMPARISON:  CT abdomen pelvis dated December 30, 2017. FINDINGS: Lower chest: New small left pleural effusion with adjacent left basilar atelectasis. Hepatobiliary: No focal liver abnormality is seen. No gallstones, gallbladder wall thickening, or biliary dilatation. Pancreas: Unremarkable. No pancreatic ductal dilatation or surrounding inflammatory changes. Spleen: Normal in size without focal abnormality. Adrenals/Urinary Tract: Adrenal glands are unremarkable. Kidneys are normal, without renal calculi, focal lesion, or hydronephrosis. Bladder is decompressed by a suprapubic catheter. Stomach/Bowel: Nasogastric tube within the stomach. There are multiple moderately dilated loops of mid small bowel, with a transition point seen in the lower midline pelvis (series 3, image 73). The loops of ileum in the pelvis appear tethered. New mild wall thickening of the distal sigmoid colon and rectum. Unchanged wall thickening at the  descending/sigmoid colon junction. Prior appendectomy. Vascular/Lymphatic: Prominent retroperitoneal lymph nodes measuring up to 1.1 cm are unchanged. No significant vascular findings. Reproductive: Prostate is unremarkable. Other: There are two new rim enhancing fluid collections in the pelvis, along the previous pathway of the ventriculoperitoneal shunt catheter, which has since been removed. One fluid collection is between loops of ileum and measures 3.1 x 1.6 x 2.0 cm (AP by transverse by CC). The other is just anterior to the rectum and measures approximately 3.6 x 4.5 x 5.0 cm (AP by transverse by CC). There is also a small rim enhancing fluid collection in the right anterior lower chest/upper abdominal wall at the site of the removed shunt catheter, measuring approximately 0.7 x 1.3 x 8.3 cm (AP by transverse by CC). No pneumoperitoneum. Musculoskeletal: No acute or significant osseous findings. IMPRESSION: 1. Interval removal of the ventriculoperitoneal shunt catheter, with two new rim enhancing fluid collections in the lower midline pelvis measuring up to 5.0 cm, along the course of the now removed shunt catheter, concerning for abscesses. 2. Moderately dilated loops of small bowel with a transition point seen in the lower midline pelvis adjacent to one of the abscesses, suggesting small bowel obstruction. The decompressed ileal loops in the pelvis may possibly be tethered secondary to the pelvic inflammatory process. 3. New wall thickening of the distal sigmoid colon and rectum, possibly reactive. Unchanged wall thickening at the descending/sigmoid colon junction. 4. Additional small rim enhancing fluid collection in the right lower anterior chest wall subcutaneous tissues at the site of the now removed shunt catheter. 5. Small left pleural effusion. Electronically Signed   By: Obie Dredge M.D.   On: 01/09/2018 17:09   Dg Abd Portable 1v-small Bowel Obstruction Protocol-initial, 8 Hr Delay  Result  Date: 01/08/2018 CLINICAL DATA:  Follow-up small bowel obstruction EXAM: PORTABLE ABDOMEN - 1 VIEW COMPARISON:  Abdominal radiographs from earlier today FINDINGS: There are persistent markedly dilated small bowel loops throughout the abdomen measuring up to the 5.3 cm diameter, not appreciably changed. Dilute oral contrast is noted throughout the large bowel and rectum. No evidence of pneumatosis or pneumoperitoneum. IMPRESSION: Persistent prominently dilated small bowel loops throughout the abdomen, unchanged. Dilute oral contrast is present throughout the large bowel and rectum. Findings suggest adynamic ileus or partial distal small bowel obstruction. Electronically Signed   By: Delbert Phenix M.D.   On: 01/08/2018 22:25   Dg Abd Portable 1v  Result Date: 01/08/2018 CLINICAL DATA:  27 year old male with NG tube placement. EXAM: PORTABLE ABDOMEN - 1 VIEW COMPARISON:  Abdominal  radiograph dated 01/04/2018 and CT dated 12/30/2017 FINDINGS: Visualized distal enteric tube with tip in the gastric fundus. Diffusely air distended loops of small bowel measure up to 6 cm in the mid abdomen. No free air identified on the provided image. No radiopaque calculi. The osseous structures are intact. IMPRESSION: 1. Enteric tube with tip in the gastric fundus. There is persistent dilatation of the small bowel loops. 2. No free air. Electronically Signed   By: Elgie Collard M.D.   On: 01/08/2018 05:12    Scheduled Meds: . chlorhexidine  15 mL Mouth Rinse BID  . Chlorhexidine Gluconate Cloth  6 each Topical Daily  . dolutegravir  50 mg Oral Daily  . emtricitabine-rilpivir-tenofovir AF  1 tablet Oral Q breakfast  . fluconazole  200 mg Oral Daily  . insulin aspart  0-9 Units Subcutaneous Q6H  . mouth rinse  15 mL Mouth Rinse q12n4p  . metoCLOPramide (REGLAN) injection  10 mg Intravenous Q6H  . sodium chloride flush  10-40 mL Intracatheter Q12H  . sulfamethoxazole-trimethoprim  1 tablet Oral Once per day on Mon Wed Fri    Continuous Infusions: . 0.9 % NaCl with KCl 20 mEq / L 65 mL/hr at 01/09/18 1500  . levETIRAcetam Stopped (01/09/18 0850)  . TPN ADULT (ION) 35 mL/hr at 01/09/18 1500  . TPN ADULT (ION)    . 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 If 7PM-7AM, please contact night-coverage at [email or web address], password [facility] 01/09/2018, 5:13 PM  LOS: 9 days

## 2018-01-10 DIAGNOSIS — B451 Cerebral cryptococcosis: Secondary | ICD-10-CM

## 2018-01-10 DIAGNOSIS — L02211 Cutaneous abscess of abdominal wall: Secondary | ICD-10-CM

## 2018-01-10 DIAGNOSIS — Z1611 Resistance to penicillins: Secondary | ICD-10-CM

## 2018-01-10 DIAGNOSIS — R188 Other ascites: Secondary | ICD-10-CM

## 2018-01-10 LAB — COMPREHENSIVE METABOLIC PANEL
ALT: 15 U/L — ABNORMAL LOW (ref 17–63)
ANION GAP: 9 (ref 5–15)
AST: 32 U/L (ref 15–41)
Albumin: 2.6 g/dL — ABNORMAL LOW (ref 3.5–5.0)
Alkaline Phosphatase: 74 U/L (ref 38–126)
BILIRUBIN TOTAL: 1.4 mg/dL — AB (ref 0.3–1.2)
BUN: <5 mg/dL — ABNORMAL LOW (ref 6–20)
CO2: 23 mmol/L (ref 22–32)
Calcium: 8.6 mg/dL — ABNORMAL LOW (ref 8.9–10.3)
Chloride: 104 mmol/L (ref 101–111)
Creatinine, Ser: 0.48 mg/dL — ABNORMAL LOW (ref 0.61–1.24)
GFR calc Af Amer: >60 mL/min (ref >60)
Glucose, Bld: 93 mg/dL (ref 65–99)
POTASSIUM: 3.7 mmol/L (ref 3.5–5.1)
Sodium: 136 mmol/L (ref 135–145)
TOTAL PROTEIN: 7.4 g/dL (ref 6.5–8.1)

## 2018-01-10 LAB — CBC
HCT: 31.7 % — ABNORMAL LOW (ref 39.0–52.0)
Hemoglobin: 10.3 g/dL — ABNORMAL LOW (ref 13.0–17.0)
MCH: 31.5 pg (ref 26.0–34.0)
MCHC: 32.5 g/dL (ref 30.0–36.0)
MCV: 96.9 fL (ref 78.0–100.0)
PLATELETS: 424 10*3/uL — AB (ref 150–400)
RBC: 3.27 MIL/uL — ABNORMAL LOW (ref 4.22–5.81)
RDW: 14.9 % (ref 11.5–15.5)
WBC: 22.8 10*3/uL — ABNORMAL HIGH (ref 4.0–10.5)

## 2018-01-10 LAB — MAGNESIUM: MAGNESIUM: 1.8 mg/dL (ref 1.7–2.4)

## 2018-01-10 LAB — GLUCOSE, CAPILLARY
GLUCOSE-CAPILLARY: 94 mg/dL (ref 65–99)
Glucose-Capillary: 102 mg/dL — ABNORMAL HIGH (ref 65–99)
Glucose-Capillary: 88 mg/dL (ref 65–99)
Glucose-Capillary: 98 mg/dL (ref 65–99)

## 2018-01-10 LAB — PHOSPHORUS: PHOSPHORUS: 3.7 mg/dL (ref 2.5–4.6)

## 2018-01-10 MED ORDER — TRAVASOL 10 % IV SOLN
INTRAVENOUS | Status: AC
Start: 2018-01-10 — End: 2018-01-11
  Administered 2018-01-10: 17:00:00 via INTRAVENOUS
  Filled 2018-01-10: qty 820.8

## 2018-01-10 MED ORDER — POTASSIUM CHLORIDE IN NACL 20-0.9 MEQ/L-% IV SOLN
INTRAVENOUS | Status: AC
  Administered 2018-01-10: 19:00:00 via INTRAVENOUS
  Filled 2018-01-10: qty 1000

## 2018-01-10 MED ORDER — POTASSIUM CHLORIDE 10 MEQ/50ML IV SOLN
10.0000 meq | INTRAVENOUS | Status: AC
  Administered 2018-01-10 (×4): 10 meq via INTRAVENOUS
  Filled 2018-01-10 (×4): qty 50

## 2018-01-10 MED ORDER — MAGNESIUM SULFATE 50 % IJ SOLN
3.0000 g | Freq: Once | INTRAVENOUS | Status: AC
  Administered 2018-01-10: 3 g via INTRAVENOUS
  Filled 2018-01-10: qty 6

## 2018-01-10 NOTE — Progress Notes (Signed)
[facility] PROGRESS NOTE  Justin Ball RUE:[id number] DOB: Sep 16, 1991 DOA: 12/30/2017 PCP: No primary care provider on file.  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 Patient was seen by the PCP several days prior to admission with history of rash along his abd wall where his VP shunt was. Patient was initially placed on bactrim. The rash got worse and was tracking along the VP shunt location (under his skin along his chest wall). Patient was admitted with sepsis, and VP shunt infection. Patient has undergone VP shunt removal and placement of EVD (on 01/01/18) by Neurosurgery. Patient also developed ileus secondary to VP shunt infection.    Assessment/Plan:  Sepsis. likely related to shunt infection. Presented with fever, leukocytosis, tachycardia, tachypnea.  Neurosurgery consulted. Patient underwent  VP shunt removal, placement of EVD on 1-08. Blood culture grew Staph Epi and Strept Pneumonia. CSF culture: NGTD. Culture VP shunt from brain grew Haemophilus.  -improved IV Vanco, as well as Medication for HIV and Prophylaxis (Bactrim and fluconazole). Cont antibiotics per ID. Appreciate ID, neurosurgery input  Will follow ID recommendation regarding continuation of vancomycin.   Ileus. Abdominal Pain; Nausea, vomiting. Multiples intra-abdominal abscess.  -NGT. General surgery following.  -still with out put form NG tube.  CT abdomen with intra-abdominal abscess.  recommendations.  IR consulted for evaluation of possible intra-abdominal abscess drainage.  -Continue with TPN.   AIDS: Continue with T[place]ay, Odefsey. ID following. On Fluconazole for history of cryptococcus Meningitis.   Seizure: Continue with Keppra.   Abnormal electrolytes. Cont replacing , monitor labs  Labs pending for  the morning.   S/P VP Shunt. Neurosurgery has been consulted due to infection. He had VP shunt for  cryptococcus meningitis in the past  -VP shunt has been removed by neurosurgery. EVD discontinued on 11/15. Cont per neurosurgery.  -CT head stable per neurosurgery   Chronic viral hepatitis. Bipolar; on Seroquel.     Code Status: full Family Communication: d/w patient, Disposition Plan: pend clinical improvement    Consultants:  ID  neurosurg  Procedures:  VP shunt removal   Antibiotics: Anti-infectives (From admission, onward)   Start     Dose/Rate Route Frequency Ordered Stop   01/09/18 1800  Ampicillin-Sulbactam (UNASYN) 3 g in sodium chloride 0.9 % 100 mL IVPB     3 g 200 mL/hr over 30 Minutes Intravenous Every 6 hours 01/09/18 1730     01/07/18 1314  vancomycin (VANCOCIN) IVPB 750 mg/150 ml premix     750 mg 150 mL/hr over 60 Minutes Intravenous Every 8 hours 01/07/18 1314     01/05/18 0500  vancomycin (VANCOCIN) IVPB 1000 mg/200 mL premix  Status:  Discontinued     1,000 mg 200 mL/hr over 60 Minutes Intravenous Every 8 hours 01/04/18 1349 01/07/18 1314   01/04/18 1000  fluconazole (DIFLUCAN) tablet 200 mg     200 mg Oral Daily 01/04/18 0844     01/03/18 1000  fluconazole (DIFLUCAN) IVPB 200 mg  Status:  Discontinued     200 mg 100 mL/hr over 60 Minutes Intravenous Every 24 hours 01/02/18 1525 01/04/18 0844   01/02/18 1100  vancomycin (VANCOCIN) 1,250 mg in sodium chloride 0.9 % 250 mL IVPB  Status:  Discontinued     1,250 mg 166.7 mL/hr over 90 Minutes Intravenous Every 8 hours 01/02/18 1025 01/04/18 1137   01/02/18 0900  sulfamethoxazole-trimethoprim (BACTRIM DS,SEPTRA DS) 800-160 MG per tablet 1 tablet  1 tablet Oral Once per day on Mon Wed Fri 12/31/17 1003     01/01/18 0930  fluconazole (DIFLUCAN) IVPB 400 mg  Status:  Discontinued     400 mg 100 mL/hr over 120 Minutes Intravenous Every 24 hours 01/01/18 0830 01/02/18 1525   12/31/17 2200  vancomycin (VANCOCIN) IVPB 1000 mg/200 mL premix  Status:  Discontinued     1,000 mg 200 mL/hr over 60 Minutes  Intravenous Every 8 hours 12/31/17 2048 01/02/18 1025   12/31/17 1400  cefTAZidime (FORTAZ) 2 g in dextrose 5 % 50 mL IVPB  Status:  Discontinued     2 g 100 mL/hr over 30 Minutes Intravenous Every 8 hours 12/31/17 1119 01/01/18 0808   12/31/17 1200  dolutegravir (TIVICAY) tablet 50 mg     50 mg Oral Daily 12/31/17 0207     12/31/17 1200  emtricitabine-rilpivir-tenofovir AF (ODEFSEY) 200-25-25 MG per tablet 1 tablet     1 tablet Oral Daily with breakfast 12/31/17 0207     12/31/17 1100  fluconazole (DIFLUCAN) tablet 400 mg  Status:  Discontinued     400 mg Oral Daily 12/31/17 1003 01/01/18 0829   12/31/17 1000  vancomycin (VANCOCIN) IVPB 1000 mg/200 mL premix  Status:  Discontinued     1,000 mg 200 mL/hr over 60 Minutes Intravenous 2 times daily 12/31/17 0444 12/31/17 2048   12/31/17 0215  sulfamethoxazole-trimethoprim (BACTRIM DS,SEPTRA DS) 800-160 MG per tablet 1 tablet  Status:  Discontinued     1 tablet Oral 2 times daily 12/31/17 0207 12/31/17 1003   12/31/17 0215  cefTRIAXone (ROCEPHIN) 2 g in dextrose 5 % 50 mL IVPB  Status:  Discontinued     2 g 100 mL/hr over 30 Minutes Intravenous Every 24 hours 12/31/17 0207 12/31/17 1119   12/30/17 2345  vancomycin (VANCOCIN) 1,500 mg in sodium chloride 0.9 % 500 mL IVPB     1,500 mg 250 mL/hr over 120 Minutes Intravenous  Once 12/30/17 2331 12/31/17 0324   12/30/17 2330  cefTRIAXone (ROCEPHIN) 2 g in dextrose 5 % 50 mL IVPB     2 g 100 mL/hr over 30 Minutes Intravenous  Once 12/30/17 2328 12/31/17 0122        HPI/Subjective: Alert and oriented.  Denies worsening abdominal pain.   Objective: Vitals:   01/10/18 0522 01/10/18 0856  BP: 104/70 108/72  Pulse: 76 84  Resp: 20 16  Temp: 98.8 F (37.1 C) 98.4 F (36.9 C)  SpO2: 99% 97%    Intake/Output Summary (Last 24 hours) at 01/10/2018 0927 Last data filed at 01/10/2018 0856 Gross per 24 hour  Intake 2937.17 ml  Output 5525 ml  Net -2587.83 ml   Filed Weights   12/30/17  1808 12/31/17 0758  Weight: 81.6 kg (180 lb) 78.8 kg (173 lb 11.6 oz)    Exam:   General:  NAD, NG tube in place   Cardiovascular: S 1, S 2 RRR  Respiratory: CTA  Abdomen: BS present, soft, mild tenderness.   Musculoskeletal: no edema  Data Reviewed: Basic Metabolic Panel: Recent Labs  Lab 01/03/18 1629 01/04/18 0956 01/05/18 0305 01/06/18 0338 01/06/18 2244 01/08/18 0423 01/09/18 0712  NA 143 142 141 138 136 136 136  K 3.1* 3.2* 3.0* 2.9* 3.2* 3.5 3.3*  CL 109 109 108 105 105 104 104  CO2 25 26 24 24 22  21* 24  GLUCOSE 93 94 87 89 83 87 119*  BUN 17 11 6  <5* <5* <5* <5*  CREATININE 0.91 0.99  0.94 0.70 0.84 0.86 0.85  CALCIUM 8.1* 8.1* 8.2* 8.1* 8.2* 8.4* 8.4*  MG 1.9  --   --  2.0  --   --  1.6*  PHOS 2.9 2.6 3.6 2.7 2.8  --  3.1   Liver Function Tests: Recent Labs  Lab 01/04/18 0956 01/05/18 0305 01/06/18 0338 01/06/18 2244 01/09/18 0712  AST  --   --   --   --  31  ALT  --   --   --   --  18  ALKPHOS  --   --   --   --  69  BILITOT  --   --   --   --  1.7*  PROT  --   --   --   --  7.0  ALBUMIN 2.2* 2.3* 2.1* 2.2* 2.3*   No results for input(s): LIPASE, AMYLASE in the last 168 hours. No results for input(s): AMMONIA in the last 168 hours. CBC: Recent Labs  Lab 01/03/18 1629 01/04/18 0513 01/05/18 0305 01/06/18 0338 01/08/18 0423 01/09/18 0712  WBC 21.2* 14.7* 22.3* 24.9* 17.8* 17.8*  NEUTROABS 19.0* 13.7* 18.6* 22.4*  --  14.3*  HGB 10.4* 10.4* 10.9* 10.3* 11.1* 10.3*  HCT 32.2* 31.9* 33.7* 31.7* 33.9* 31.7*  MCV 94.7 96.4 96.6 95.5 95.2 95.8  PLT 363 355 400 432* 468* 451*   Cardiac Enzymes: No results for input(s): CKTOTAL, CKMB, CKMBINDEX, TROPONINI in the last 168 hours. BNP (last 3 results) No results for input(s): BNP in the last 8760 hours.  ProBNP (last 3 results) No results for input(s): PROBNP in the last 8760 hours.  CBG: Recent Labs  Lab 01/09/18 0626 01/09/18 1149 01/09/18 1818 01/10/18 0049 01/10/18 0622  GLUCAP  97 97 86 94 102*    Recent Results (from the past 240 hour(s))  Urine Culture     Status: Abnormal   Collection Time: 12/31/17 10:54 AM  Result Value Ref Range Status   Specimen Description URINE, CLEAN CATCH  Final   Special Requests NONE  Final   Culture 40,000 COLONIES/mL ENTEROCOCCUS FAECALIS (A)  Final   Report Status 01/02/2018 FINAL  Final   Organism ID, Bacteria ENTEROCOCCUS FAECALIS (A)  Final      Susceptibility   Enterococcus faecalis - MIC*    AMPICILLIN <=2 SENSITIVE Sensitive     LEVOFLOXACIN 1 SENSITIVE Sensitive     NITROFURANTOIN <=16 SENSITIVE Sensitive     VANCOMYCIN 1 SENSITIVE Sensitive     * 40,000 COLONIES/mL ENTEROCOCCUS FAECALIS  Surgical pcr screen     Status: None   Collection Time: 01/01/18  2:08 PM  Result Value Ref Range Status   MRSA, PCR NEGATIVE NEGATIVE Final   Staphylococcus aureus NEGATIVE NEGATIVE Final    Comment: (NOTE) The Xpert SA Assay (FDA approved for NASAL specimens in patients 86 years of age and older), is one component of a comprehensive surveillance program. It is not intended to diagnose infection nor to guide or monitor treatment.   CSF culture     Status: None   Collection Time: 01/01/18  7:37 PM  Result Value Ref Range Status   Specimen Description CSF  Final   Special Requests SHUNT  Final   Gram Stain   Final    WBC PRESENT, PREDOMINANTLY MONONUCLEAR NO ORGANISMS SEEN CYTOSPIN SMEAR    Culture NO GROWTH 3 DAYS  Final   Report Status 01/06/2018 FINAL  Final  Aerobic/Anaerobic Culture (surgical/deep wound)     Status: None  Collection Time: 01/01/18  7:39 PM  Result Value Ref Range Status   Specimen Description WOUND  Final   Special Requests VP SHUNT FROM BRAIN  Final   Gram Stain   Final    ABUNDANT WBC PRESENT,BOTH PMN AND MONONUCLEAR NO ORGANISMS SEEN    Culture   Final    FEW HAEMOPHILUS INFLUENZAE BETA LACTAMASE NEGATIVE NO ANAEROBES ISOLATED CRITICAL RESULT CALLED TO, READ BACK BY AND VERIFIED WITH: A  ELLIOTT,RN AT 1623 01/05/18 BY L BENFIELD CONCERNING GROWTH ON CULTURE    Report Status 01/07/2018 FINAL  Final  C difficile quick scan w PCR reflex     Status: None   Collection Time: 01/09/18  9:36 AM  Result Value Ref Range Status   C Diff antigen NEGATIVE NEGATIVE Final   C Diff toxin NEGATIVE NEGATIVE Final   C Diff interpretation No C. difficile detected.  Final     Studies: Ct Head Wo Contrast  Result Date: 01/08/2018 CLINICAL DATA:  27 year old male. 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 Recent progressive rash along the abdominal course of the shunt. Subsequently admitted with sepsis. Now status post shunt removal and EVD placement on 01/01/2018. EXAM: CT HEAD WITHOUT CONTRAST TECHNIQUE: Contiguous axial images were obtained from the base of the skull through the vertex without intravenous contrast. COMPARISON:  CT head and neck 12/30/2017 and earlier. FINDINGS: Brain: New right superior frontal approach external ventricular drain. Catheter courses into the anterior right lateral ventricle, tip terminating adjacent to the septum pellucidum. Associated moderate volume pneumoventricle with mild generalized increased ventricular size compared to 12/30/2017. No transependymal edema is evident. No other pneumocephalus. No midline shift, mass effect, evidence of mass lesion, intracranial hemorrhage or evidence of cortically based acute infarction. Stable gray-white matter differentiation throughout the brain. Vascular: No suspicious intracranial vascular hyperdensity. Skull: No acute osseous abnormality identified. The same right frontal approach burr hole or shunt tract has been utilized for the EVD. Sinuses/Orbits: Visualized paranasal sinuses and mastoids are stable and well pneumatized. Other: Right nasoenteric tube in place. Visualized orbit soft tissues are within normal limits. Postoperative changes along the right scalp and visible posterior  right neck. Broad-based mild scalp hematoma overlying the right vertex. IMPRESSION: 1. Removed right VP shunt and placement of right EVD via the same skull access. Mild scalp hematoma. 2. Moderate volume pneumo-ventricle and mild generalized increased ventricular size compared to 12/30/2017. No transependymal edema. 3. Otherwise stable noncontrast CT appearance of the brain. Electronically Signed   By: Odessa Fleming M.D.   On: 01/08/2018 12:18   Ct Abdomen Pelvis W Contrast  Result Date: 01/09/2018 CLINICAL DATA:  Evaluate ileus. New onset diarrhea. No pain or nausea. EXAM: CT ABDOMEN AND PELVIS WITH CONTRAST TECHNIQUE: Multidetector CT imaging of the abdomen and pelvis was performed using the standard protocol following bolus administration of intravenous contrast. CONTRAST:  ISOVUE-300 IOPAMIDOL (ISOVUE-300) INJECTION 61% COMPARISON:  CT abdomen pelvis dated December 30, 2017. FINDINGS: Lower chest: New small left pleural effusion with adjacent left basilar atelectasis. Hepatobiliary: No focal liver abnormality is seen. No gallstones, gallbladder wall thickening, or biliary dilatation. Pancreas: Unremarkable. No pancreatic ductal dilatation or surrounding inflammatory changes. Spleen: Normal in size without focal abnormality. Adrenals/Urinary Tract: Adrenal glands are unremarkable. Kidneys are normal, without renal calculi, focal lesion, or hydronephrosis. Bladder is decompressed by a suprapubic catheter. Stomach/Bowel: Nasogastric tube within the stomach. There are multiple moderately dilated loops of mid small bowel, with a transition point seen in  the lower midline pelvis (series 3, image 73). The loops of ileum in the pelvis appear tethered. New mild wall thickening of the distal sigmoid colon and rectum. Unchanged wall thickening at the descending/sigmoid colon junction. Prior appendectomy. Vascular/Lymphatic: Prominent retroperitoneal lymph nodes measuring up to 1.1 cm are unchanged. No significant  vascular findings. Reproductive: Prostate is unremarkable. Other: There are two new rim enhancing fluid collections in the pelvis, along the previous pathway of the ventriculoperitoneal shunt catheter, which has since been removed. One fluid collection is between loops of ileum and measures 3.1 x 1.6 x 2.0 cm (AP by transverse by CC). The other is just anterior to the rectum and measures approximately 3.6 x 4.5 x 5.0 cm (AP by transverse by CC). There is also a small rim enhancing fluid collection in the right anterior lower chest/upper abdominal wall at the site of the removed shunt catheter, measuring approximately 0.7 x 1.3 x 8.3 cm (AP by transverse by CC). No pneumoperitoneum. Musculoskeletal: No acute or significant osseous findings. IMPRESSION: 1. Interval removal of the ventriculoperitoneal shunt catheter, with two new rim enhancing fluid collections in the lower midline pelvis measuring up to 5.0 cm, along the course of the now removed shunt catheter, concerning for abscesses. 2. Moderately dilated loops of small bowel with a transition point seen in the lower midline pelvis adjacent to one of the abscesses, suggesting small bowel obstruction. The decompressed ileal loops in the pelvis may possibly be tethered secondary to the pelvic inflammatory process. 3. New wall thickening of the distal sigmoid colon and rectum, possibly reactive. Unchanged wall thickening at the descending/sigmoid colon junction. 4. Additional small rim enhancing fluid collection in the right lower anterior chest wall subcutaneous tissues at the site of the now removed shunt catheter. 5. Small left pleural effusion. Electronically Signed   By: Obie DredgeWilliam T Derry M.D.   On: 01/09/2018 17:09   Dg Abd Portable 1v-small Bowel Obstruction Protocol-initial, 8 Hr Delay  Result Date: 01/08/2018 CLINICAL DATA:  Follow-up small bowel obstruction EXAM: PORTABLE ABDOMEN - 1 VIEW COMPARISON:  Abdominal radiographs from earlier today FINDINGS:  There are persistent markedly dilated small bowel loops throughout the abdomen measuring up to the 5.3 cm diameter, not appreciably changed. Dilute oral contrast is noted throughout the large bowel and rectum. No evidence of pneumatosis or pneumoperitoneum. IMPRESSION: Persistent prominently dilated small bowel loops throughout the abdomen, unchanged. Dilute oral contrast is present throughout the large bowel and rectum. Findings suggest adynamic ileus or partial distal small bowel obstruction. Electronically Signed   By: Delbert PhenixJason A Poff M.D.   On: 01/08/2018 22:25    Scheduled Meds: . chlorhexidine  15 mL Mouth Rinse BID  . Chlorhexidine Gluconate Cloth  6 each Topical Daily  . dolutegravir  50 mg Oral Daily  . emtricitabine-rilpivir-tenofovir AF  1 tablet Oral Q breakfast  . fluconazole  200 mg Oral Daily  . insulin aspart  0-9 Units Subcutaneous Q6H  . mouth rinse  15 mL Mouth Rinse q12n4p  . metoCLOPramide (REGLAN) injection  10 mg Intravenous Q6H  . sodium chloride flush  10-40 mL Intracatheter Q12H  . sulfamethoxazole-trimethoprim  1 tablet Oral Once per day on Mon Wed Fri   Continuous Infusions: . 0.9 % NaCl with KCl 20 mEq / L 65 mL/hr at 01/09/18 1827  . ampicillin-sulbactam (UNASYN) IV Stopped (01/10/18 0915)  . levETIRAcetam Stopped (01/09/18 2243)  . TPN ADULT (ION) 35 mL/hr at 01/09/18 1722  . 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 If 7PM-7AM, please contact night-coverage at [email or web address], password [facility] 01/10/2018, 9:27 AM  LOS: 10 days

## 2018-01-10 NOTE — Progress Notes (Signed)
Patient ID: Justin Ball, male   DOB: [birth year], 27 y.o.   MRN: [id number]   Acute Care Surgery Service Progress Note:    Chief Complaint/Subjective: Had CT yesterday pm Denies abd pain Still having some liquid stool About 400cc/24 hrs of light bilious NG output  Objective: Vital signs in last 24 hours: Temp:  [97.8 F (36.6 C)-99.7 F (37.6 C)] 98.8 F (37.1 C) (01/17 0522) Pulse Rate:  [76-119] 76 (01/17 0522) Resp:  [19-28] 20 (01/17 0522) BP: (104-159)/(59-141) 104/70 (01/17 0522) SpO2:  [94 %-99 %] 99 % (01/17 0522) Last BM Date: 01/09/18  Intake/Output from previous day: 01/16 0701 - 01/17 0700 In: 3287.2 [I.V.:2117.2; IV Piggyback:1170] Out: 7654 [Urine:4225; Emesis/NG output:400; Stool:450] Intake/Output this shift: No intake/output data recorded.  Lungs: cta, nonlabored  Cardiovascular: reg  Abd: soft, min distension, min TTP; no guarding/rebound  Extremities: no edema, +SCDs  Neuro: alert, nonfocal  Lab Results: CBC  Recent Labs    01/08/18 0423 01/09/18 0712  WBC 17.8* 17.8*  HGB 11.1* 10.3*  HCT 33.9* 31.7*  PLT 468* 451*   BMET Recent Labs    01/08/18 0423 01/09/18 0712  NA 136 136  K 3.5 3.3*  CL 104 104  CO2 21* 24  GLUCOSE 87 119*  BUN <5* <5*  CREATININE 0.86 0.85  CALCIUM 8.4* 8.4*   LFT Hepatic Function Latest Ref Rng & Units 01/09/2018 01/06/2018 01/06/2018  Total Protein 6.5 - 8.1 g/dL 7.0 - -  Albumin 3.5 - 5.0 g/dL 2.3(L) 2.2(L) 2.1(L)  AST 15 - 41 U/L 31 - -  ALT 17 - 63 U/L 18 - -  Alk Phosphatase 38 - 126 U/L 69 - -  Total Bilirubin 0.3 - 1.2 mg/dL 1.7(H) - -  Bilirubin, Direct 0.1 - 0.5 mg/dL - - -   PT/INR No results for input(s): LABPROT, INR in the last 72 hours. ABG No results for input(s): PHART, HCO3 in the last 72 hours.  Invalid input(s): PCO2, PO2  Studies/Results:  Anti-infectives: Anti-infectives (From admission, onward)   Start     Dose/Rate Route Frequency Ordered Stop   01/09/18 1800   Ampicillin-Sulbactam (UNASYN) 3 g in sodium chloride 0.9 % 100 mL IVPB     3 g 200 mL/hr over 30 Minutes Intravenous Every 6 hours 01/09/18 1730     01/07/18 1314  vancomycin (VANCOCIN) IVPB 750 mg/150 ml premix     750 mg 150 mL/hr over 60 Minutes Intravenous Every 8 hours 01/07/18 1314     01/05/18 0500  vancomycin (VANCOCIN) IVPB 1000 mg/200 mL premix  Status:  Discontinued     1,000 mg 200 mL/hr over 60 Minutes Intravenous Every 8 hours 01/04/18 1349 01/07/18 1314   01/04/18 1000  fluconazole (DIFLUCAN) tablet 200 mg     200 mg Oral Daily 01/04/18 0844     01/03/18 1000  fluconazole (DIFLUCAN) IVPB 200 mg  Status:  Discontinued     200 mg 100 mL/hr over 60 Minutes Intravenous Every 24 hours 01/02/18 1525 01/04/18 0844   01/02/18 1100  vancomycin (VANCOCIN) 1,250 mg in sodium chloride 0.9 % 250 mL IVPB  Status:  Discontinued     1,250 mg 166.7 mL/hr over 90 Minutes Intravenous Every 8 hours 01/02/18 1025 01/04/18 1137   01/02/18 0900  sulfamethoxazole-trimethoprim (BACTRIM DS,SEPTRA DS) 800-160 MG per tablet 1 tablet     1 tablet Oral Once per day on Mon Wed Fri 12/31/17 1003     01/01/18 0930  fluconazole (DIFLUCAN) IVPB  400 mg  Status:  Discontinued     400 mg 100 mL/hr over 120 Minutes Intravenous Every 24 hours 01/01/18 0830 01/02/18 1525   12/31/17 2200  vancomycin (VANCOCIN) IVPB 1000 mg/200 mL premix  Status:  Discontinued     1,000 mg 200 mL/hr over 60 Minutes Intravenous Every 8 hours 12/31/17 2048 01/02/18 1025   12/31/17 1400  cefTAZidime (FORTAZ) 2 g in dextrose 5 % 50 mL IVPB  Status:  Discontinued     2 g 100 mL/hr over 30 Minutes Intravenous Every 8 hours 12/31/17 1119 01/01/18 0808   12/31/17 1200  dolutegravir (TIVICAY) tablet 50 mg     50 mg Oral Daily 12/31/17 0207     12/31/17 1200  emtricitabine-rilpivir-tenofovir AF (ODEFSEY) 200-25-25 MG per tablet 1 tablet     1 tablet Oral Daily with breakfast 12/31/17 0207     12/31/17 1100  fluconazole (DIFLUCAN) tablet  400 mg  Status:  Discontinued     400 mg Oral Daily 12/31/17 1003 01/01/18 0829   12/31/17 1000  vancomycin (VANCOCIN) IVPB 1000 mg/200 mL premix  Status:  Discontinued     1,000 mg 200 mL/hr over 60 Minutes Intravenous 2 times daily 12/31/17 0444 12/31/17 2048   12/31/17 0215  sulfamethoxazole-trimethoprim (BACTRIM DS,SEPTRA DS) 800-160 MG per tablet 1 tablet  Status:  Discontinued     1 tablet Oral 2 times daily 12/31/17 0207 12/31/17 1003   12/31/17 0215  cefTRIAXone (ROCEPHIN) 2 g in dextrose 5 % 50 mL IVPB  Status:  Discontinued     2 g 100 mL/hr over 30 Minutes Intravenous Every 24 hours 12/31/17 0207 12/31/17 1119   12/30/17 2345  vancomycin (VANCOCIN) 1,500 mg in sodium chloride 0.9 % 500 mL IVPB     1,500 mg 250 mL/hr over 120 Minutes Intravenous  Once 12/30/17 2331 12/31/17 0324   12/30/17 2330  cefTRIAXone (ROCEPHIN) 2 g in dextrose 5 % 50 mL IVPB     2 g 100 mL/hr over 30 Minutes Intravenous  Once 12/30/17 2328 12/31/17 0122      Medications: Scheduled Meds: . chlorhexidine  15 mL Mouth Rinse BID  . Chlorhexidine Gluconate Cloth  6 each Topical Daily  . dolutegravir  50 mg Oral Daily  . emtricitabine-rilpivir-tenofovir AF  1 tablet Oral Q breakfast  . fluconazole  200 mg Oral Daily  . insulin aspart  0-9 Units Subcutaneous Q6H  . mouth rinse  15 mL Mouth Rinse q12n4p  . metoCLOPramide (REGLAN) injection  10 mg Intravenous Q6H  . sodium chloride flush  10-40 mL Intracatheter Q12H  . sulfamethoxazole-trimethoprim  1 tablet Oral Once per day on Mon Wed Fri   Continuous Infusions: . 0.9 % NaCl with KCl 20 mEq / L 65 mL/hr at 01/09/18 1827  . ampicillin-sulbactam (UNASYN) IV Stopped (01/10/18 0456)  . levETIRAcetam Stopped (01/09/18 2243)  . TPN ADULT (ION) 35 mL/hr at 01/09/18 1722  . vancomycin Stopped (01/10/18 0711)   PRN Meds:.acetaminophen, acetaminophen, hydrocortisone cream, morphine injection, ondansetron **OR** ondansetron (ZOFRAN) IV, prochlorperazine,  QUEtiapine, sodium chloride flush  Assessment/Plan: Patient Active Problem List   Diagnosis Date Noted  . Coagulase negative Staphylococcus bacteremia 12/31/2017  . 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 Fecal incontinence 11/19/2017  . Seizure disorder ([place]) 09/12/2017  . 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 Staphylococcus epidermidis ventriculitis 06/26/2017  . Chronic viral hepatitis B without coma and with delta agent ([place])   . History of syphilis   . Tobacco abuse   .  Cryptococcal meningitis ([place])   . 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 Anemia 09/12/2016  . 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 Bipolar disorder ([place]) 04/08/2013   s/p Procedure(s): REMOVAL OF V-P SHUNT, PLACEMENT OF EVD 01/01/2018  Infected VP shunt s/p removal Ileus Intra-abd fluid collections/abscesses  Ct showed several intra-abd fluid collections suggestive of abscesses with reactive SB wall thickening. This explains ongoing ileus.  -will ask IR if the pelvic collection is amenable to drainage but I doubt it is -would not recommend surgical exploration for this -agree with expansion of abx - but will defer to ID -will restart NG clamping trials  FEN: NPO except water/ice, IVF; NGT to LIWS- restart clamping trials as tolerated;  cont TPN VTE: SCDs ID: vanc 1/12>>, PO bactrim 1/9>>, PO diflucan 1/9>>, unasyn 1/16; c diff negative 1/16  D/c enteric precautions Resume ng clamping trials Cont TPN Ambulate Keep K close to 4   Disposition:  LOS: 10 days    Leighton Ruff. Redmond Pulling, MD, FACS General, Bariatric, & Minimally Invasive Surgery [phone] [facility]

## 2018-01-10 NOTE — Progress Notes (Signed)
Occupational Therapy Treatment Patient Details Name: Justin Ball R Patron MRN: [id number] DOB: 03/05/1991 Today's Date: 01/10/2018    History of present illness 27 y.o. 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  He required min A for grooming at sink - requires assist to sequence.  Mod A for toilet transfers as he requires assist to maneuver RW in small spaces.  Grandmother present and very supportive.  Will continue to follow.   Follow Up Recommendations  Outpatient OT;Supervision/Assistance - 24 hour    Equipment Recommendations  None recommended by OT    Recommendations for Other Services      Precautions / Restrictions Precautions Precautions: Fall Precaution Comments: NGT        Mobility Bed Mobility Overal bed mobility: Needs Assistance Bed Mobility: Supine to Sit     Supine to sit: Supervision        Transfers Overall transfer level: Needs assistance Equipment used: Rolling walker (2 wheeled) Transfers: Sit to/from [employer] CorporationStand;Stand Pivot Transfers Sit to Stand: Min guard Stand pivot transfers: Min assist       General transfer comment: assist to steady     Balance Overall balance assessment: Needs assistance Sitting-balance support: Feet unsupported Sitting balance-Leahy Scale: Fair     Standing balance support: No upper extremity supported Standing balance-Leahy Scale: Fair                             ADL either performed or assessed with clinical judgement   ADL Overall ADL's : Needs assistance/impaired Eating/Feeding: NPO   Grooming: Wash/dry hands;Wash/dry face;Oral care;Minimal assistance;Standing Grooming Details (indicate cue type and reason): requires min A to  sequence task and min guard for balance              Lower Body Dressing: Minimal assistance;Sit to/from stand Lower Body Dressing Details (indicate cue type and reason): able to don socks EOB  Toilet Transfer: Moderate assistance;Ambulation;Comfort height toilet;RW StatisticianToilet Transfer Details (indicate cue type and reason): assist to maneuver RW through small spaces          Functional mobility during ADLs: Minimal assistance;+2 for safety/equipment;Cueing for safety;Cueing for sequencing       Vision       Perception     Praxis      Cognition Arousal/Alertness: Awake/alert Behavior During Therapy: Flat affect Overall Cognitive Status: Impaired/Different from baseline Area of Impairment: Attention;Memory;Following commands;Safety/judgement;Awareness;Problem solving                   Current Attention Level: Sustained Memory: Decreased short-term memory Following Commands: Follows multi-step commands with increased time Safety/Judgement: Decreased awareness of safety;Decreased awareness of deficits   Problem Solving: Slow processing;Decreased initiation;Requires verbal cues;Difficulty sequencing;Requires tactile cues          Exercises     Shoulder Instructions       General Comments      Pertinent Vitals/ Pain       Pain Assessment: No/denies pain  Home Living                                          Prior  Functioning/Environment              Frequency  Min 2X/week        Progress Toward Goals  OT Goals(current goals can now be found in the care plan section)  Progress towards OT goals: Progressing toward goals     Plan Discharge plan remains appropriate    Co-evaluation                 AM-PAC PT "6 Clicks" Daily Activity     Outcome Measure   Help from another person eating meals?: Total Help from another person taking care of personal grooming?: A Little Help from another person toileting, which  includes using toliet, bedpan, or urinal?: A Lot Help from another person bathing (including washing, rinsing, drying)?: A Lot Help from another person to put on and taking off regular upper body clothing?: A Lot Help from another person to put on and taking off regular lower body clothing?: A Lot 6 Click Score: 12    End of Session Equipment Utilized During Treatment: Rolling walker  OT Visit Diagnosis: Unsteadiness on feet (R26.81);Cognitive communication deficit (R41.841)   Activity Tolerance Patient tolerated treatment well   Patient Left in chair;with call bell/phone within reach;with chair alarm set;with family/visitor present   Nurse Communication Mobility status        Time: 1610-9604 OT Time Calculation (min): 33 min  Charges: OT General Charges $OT Visit: 1 Visit OT Treatments $Self Care/Home Management : 8-22 mins $Therapeutic Activity: 8-22 mins  [employer], OTR/L [phone]    Jeani Hawking M 01/10/2018, 6:53 PM

## 2018-01-10 NOTE — Progress Notes (Signed)
Nutrition Follow-up  DOCUMENTATION CODES:   Not applicable  INTERVENTION:  TPN per pharmacy Recommend daily weights  NUTRITION DIAGNOSIS:   Inadequate oral intake related to inability to eat as evidenced by NPO status. 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 Pt with NG tube on/off clamped. On 1/15 pt vomited large amount per surgery starting SB protocol with NPO x 1 week, IV reglan started, keep K+ >4.0, NGT to LIWS. Plan to start TPN.   Tolerating TPN Continues to be NPO NGT 460m output  Labs reviewed: TBili 1.4  Medications reviewed and include:  Insulin, Reglan NS 20 K+ 662mhr  NS 20 K+ 3537mr 10 K+ runs x4   Intake/Output Summary (Last 24 hours) at 01/10/2018 1415 Last data filed at 01/10/2018 1200 Gross per 24 hour  Intake 2937.17 ml  Output 5250 ml  Net -2312.83 ml  UOP 1150m36m far today.  Diet Order:  TPN ADULT (ION) Diet NPO time specified Except for: Sips with Meds, Ice Chips TPN ADULT (ION)  EDUCATION NEEDS:   No education needs have been identified at this time  Skin:  Skin Assessment: Reviewed RN Assessment  Last BM:  01/09/2018  Height:   Ht Readings from Last 1 Encounters:  12/31/17 '6\' 1"'  (1.854 m)    Weight:   Wt Readings from Last 1 Encounters:  12/31/17 173 lb 11.6 oz (78.8 kg)    Ideal Body Weight:  83.6 kg  BMI:  Body mass index is 22.92 kg/m.  Estimated Nutritional Needs:   Kcal:  2200-2400  Protein:  110-125 grams  Fluid:  > 2 L/day  WillSatira Anisrd, MS, RD LDN Inpatient Clinical Dietitian Pager [phone]

## 2018-01-10 NOTE — Progress Notes (Signed)
PHARMACY - ADULT TOTAL PARENTERAL NUTRITION CONSULT NOTE   Pharmacy Consult:  TPN Indication:  Ileus  Patient Measurements: Height: 6\' 1"  (185.4 cm) Weight: 173 lb 11.6 oz (78.8 kg) IBW/kg (Calculated) : 79.9 TPN AdjBW (KG): 78.8 Body mass index is 22.92 kg/m.  Assessment:  Justin Ball with history of neurogenic bowel presented on 12/31/17 with fever and rash.  He was also having nausea and vomiting with hypoactive bowel sounds and distended abdomen on 01/01/18.  Diagnosed with ileus secondary to infected VP shunt.  Patient tolerated intermittent clamping of NG tube and consumed some clear liquids.  On 01/08/18, he vomited a large amount of green/clear bile.  Pharmacy consulted to manage TPN.  Patient reports no recent weight loss/gain and he was eating regularly PTA.  GI: baseline prealbumin low at 8, NG O/P down to 400mL, stool O/P up to 450mL.  1/16 CT showed SBO - Reglan IV Endo: no hx DM - CBGs low normal Insulin requirements in the past 24 hours: 0 unit SSI  Lytes: ?mild refeeding - K+ improved to 3.7 (goal >/= 4 for ileus), Mag improved to 1.8 (goal >/= 2 for ileus), others WNL Renal: neurogenic bladder - SCr 0.48 stable, BUN <5.  UOP 2.2 ml/kg/hr, NS 20K at 65 ml/hr Pulm: stable on RA Cards: VSS Hepatobil: Hep B - LFTs WNL, tbili mildly elevated at 1.4.  TG WNL. Neuro: Bipolar / depression / CVA / seizure - Keppra IV, PRN APAP/morphine/Seroquel ID: Vanc (1/7 >> ) for CoNS bacteremia + VP shunt infxn.  Unasyn (1/16 >>) for cellulitis/abscess.  Afebrile, WBC up to 22.8 AIDS - dolutegravir, Odefsey, Septra/Fluc Hx cryptococcal meningitis s/p VP shunt >> removed and EVD replaced 1/9 >> removed 1/15 TPN Access: PICC placed 01/08/18 TPN start date: 01/08/18  Nutritional Goals (per RD rec 1/15): 2200-2400 kCal and 110-125 gm protein per day  Current Nutrition:  TPN   Plan:  Increase TPN to 60 ml/hr (goal ~80 ml/hr).  TPN will provide 82g AA, 245g CHO and 36g ILE, which equals to 1521  kCal, meeting ~70% of needs.  Advance to goal in AM pending electrolytes. Electrolytes in TPN:  Na/K/Mag increased 1/16, no change to Phos, Cl:Ac 1:2 Daily multivitamin and trace elements in TPN Reduce NS 20K to 35 ml/hr  Continue sensitive SSI Q6H.  D/C if CBGs remain controlled at goal TPN rate. KCL x 4 runs Mag sulfate 3gm IV x 1 F/U AM labs   Zachrey Deutscher D. Laney Potashang, PharmD, BCPS Pager:  [phone] 01/10/2018, 12:38 PM

## 2018-01-10 NOTE — Progress Notes (Signed)
[facility]   Reason for visit: Follow up on abdominal abscess  Interval History: reconsulted for abdominal abscess noted on CT done due to ileus.  No complaints; no fever, WBC yesterday at 17.8. Blood culture 1/6 with Staph epi, MRSE on BCID but culture oxacillin sensitive. No plans for surgical intervention.   Day 2 amp/sulbactam Day 9 vancomycin    Physical Exam: Constitutional:  Vitals:   01/10/18 0522 01/10/18 0856  BP: 104/70 108/72  Pulse: 76 84  Resp: 20 16  Temp: 98.8 F (37.1 C) 98.4 F (36.9 C)  SpO2: 99% 97%   patient appears in NAD Eyes: anicteric HENT: no thrush; staples on right head, no surrounding erythema Respiratory: Normal respiratory effort; CTA B Cardiovascular: RRR GI: soft, nt, nd  Review of Systems: Constitutional: negative for fevers and chills Gastrointestinal: negative for nausea and diarrhea Integument/breast: negative for rash  Lab Results  Component Value Date   WBC 17.8 (H) 01/09/2018   HGB 10.3 (L) 01/09/2018   HCT 31.7 (L) 01/09/2018   MCV 95.8 01/09/2018   PLT 451 (H) 01/09/2018    Lab Results  Component Value Date   CREATININE 0.85 01/09/2018   BUN <5 (L) 01/09/2018   NA 136 01/09/2018   K 3.3 (L) 01/09/2018   CL 104 01/09/2018   CO2 24 01/09/2018    Lab Results  Component Value Date   ALT 18 01/09/2018   AST 31 01/09/2018   ALKPHOS 69 01/09/2018     Microbiology: Recent Results (from the past 240 hour(s))  Urine Culture     Status: Abnormal   Collection Time: 12/31/17 10:54 AM  Result Value Ref Range Status   Specimen Description URINE, CLEAN CATCH  Final   Special Requests NONE  Final   Culture 40,000 COLONIES/mL ENTEROCOCCUS FAECALIS (A)  Final   Report Status 01/02/2018 FINAL  Final   Organism ID, Bacteria ENTEROCOCCUS FAECALIS (A)  Final      Susceptibility   Enterococcus faecalis - MIC*    AMPICILLIN <=2 SENSITIVE Sensitive     LEVOFLOXACIN 1 SENSITIVE Sensitive    NITROFURANTOIN <=16 SENSITIVE Sensitive     VANCOMYCIN 1 SENSITIVE Sensitive     * 40,000 COLONIES/mL ENTEROCOCCUS FAECALIS  Surgical pcr screen     Status: None   Collection Time: 01/01/18  2:08 PM  Result Value Ref Range Status   MRSA, PCR NEGATIVE NEGATIVE Final   Staphylococcus aureus NEGATIVE NEGATIVE Final    Comment: (NOTE) The Xpert SA Assay (FDA approved for NASAL specimens in patients 52 years of age and older), is one component of a comprehensive surveillance program. It is not intended to diagnose infection nor to guide or monitor treatment.   CSF culture     Status: None   Collection Time: 01/01/18  7:37 PM  Result Value Ref Range Status   Specimen Description CSF  Final   Special Requests SHUNT  Final   Gram Stain   Final    WBC PRESENT, PREDOMINANTLY MONONUCLEAR NO ORGANISMS SEEN CYTOSPIN SMEAR    Culture NO GROWTH 3 DAYS  Final   Report Status 01/06/2018 FINAL  Final  Aerobic/Anaerobic Culture (surgical/deep wound)     Status: None   Collection Time: 01/01/18  7:39 PM  Result Value Ref Range Status   Specimen Description WOUND  Final   Special Requests VP SHUNT FROM BRAIN  Final   Gram Stain   Final    ABUNDANT WBC PRESENT,BOTH PMN AND MONONUCLEAR  NO ORGANISMS SEEN    Culture   Final    FEW HAEMOPHILUS INFLUENZAE BETA LACTAMASE NEGATIVE NO ANAEROBES ISOLATED CRITICAL RESULT CALLED TO, READ BACK BY AND VERIFIED WITH: A ELLIOTT,RN AT 1623 01/05/18 BY L BENFIELD CONCERNING GROWTH ON CULTURE    Report Status 01/07/2018 FINAL  Final  C difficile quick scan w PCR reflex     Status: None   Collection Time: 01/09/18  9:36 AM  Result Value Ref Range Status   C Diff antigen NEGATIVE NEGATIVE Final   C Diff toxin NEGATIVE NEGATIVE Final   C Diff interpretation No C. difficile detected.  Final    Impression/Plan:  1. Abdominal abscess - no surgical intervention or drainage planned.  Likely cause of ileus.  On amp sulbactam and will continue.  If WBC remains up  tomorrow, will consider broadening to ceftriaxone/flagyl.    2.  Staph epi - previous recommendation was to stop vancomycin on 1/16.  I will stop now.    3.  Shunt infection - removed.  Hopefully will not need it replaced.  Will stop vancomycin.    4.  HIV - odefsey, [place].  Will continue. CD4 400, viral load < 20.    5.  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

## 2018-01-10 NOTE — Progress Notes (Addendum)
Pharmacy Antibiotic Note  Justin Ball is a 27 y.o. male admitted on 12/30/2017. Pharmacy has been consulted for vancomycin dosing for abdominal wall cellulitis, abscesses + VP shunt infection. Also with Unasyn added 1/16. Pt is afebrile and WBC stable 17.8. VPS removed and EVD placed 1/8. SCr stable wnl, good UOP.  Vancomycin dose was reduced on 1/14 after VT 21 (drawn 1.5 hrs early).  Plan: Continue vancomycin at 750mg  IV q8h Unasyn 3g IV q6h per MD Monitor clinical progress, c/s, renal function F/u de-escalation plan/LOT - MD to have ID see again 1/17 Repeat vancomycin trough soon if continuing  Height: 6\' 1"  (185.4 cm) Weight: 173 lb 11.6 oz (78.8 kg) IBW/kg (Calculated) : 79.9  Temp (24hrs), Avg:98.7 F (37.1 C), Min:97.8 F (36.6 C), Max:99.7 F (37.6 C)  Recent Labs  Lab 01/04/18 0513 01/04/18 0943  01/05/18 0305 01/06/18 0338 01/06/18 2244 01/07/18 1159 01/08/18 0423 01/09/18 0712  WBC 14.7*  --   --  22.3* 24.9*  --   --  17.8* 17.8*  CREATININE  --   --    < > 0.94 0.70 0.84  --  0.86 0.85  VANCOTROUGH  --  30*  --   --   --   --  21*  --   --    < > = values in this interval not displayed.    Estimated Creatinine Clearance: 146.8 mL/min (by C-G formula based on SCr of 0.85 mg/dL).    Allergies  Allergen Reactions  . Acyclovir And Related    Bactrim ppx PTA >> Vanc 1/7 >> CTX 1/7 >> 1/7 Ceftaz 1/7 >> 1/8 Unasyn 1/16> Fluconazole PO PTA >>  1/6 BCx >> 2/2 CoNS 1/7 UCx >> 40K enterococcus faecalis (pan sens) 1/7 MRSA PCR >> negative 1/8 CSF - negative 1/8 shunt hflu (ignoring per ID) 1/16 cdiff: neg  1/9 VT = 13 on 1gm IV Q8H 1/11 VT = 30 on 1250mg  Q8H (received ~1/2 of 1250mg  after trough resulted) 1/14 VT = 21 (1.5 hr early, but reducted to 750 q8h for now)  Babs BertinHaley Sania Ball, PharmD, BCPS Clinical Pharmacist Clinical phone for 01/10/2018 until 3:30pm: [phone] If after 3:30pm, please call main pharmacy at: [phone] 01/10/2018 9:04 AM

## 2018-01-11 DIAGNOSIS — Z21 Asymptomatic human immunodeficiency virus [HIV] infection status: Secondary | ICD-10-CM

## 2018-01-11 DIAGNOSIS — K651 Peritoneal abscess: Secondary | ICD-10-CM

## 2018-01-11 LAB — BASIC METABOLIC PANEL
ANION GAP: 10 (ref 5–15)
BUN: <5 mg/dL — ABNORMAL LOW (ref 6–20)
CALCIUM: 8.9 mg/dL (ref 8.9–10.3)
CO2: 24 mmol/L (ref 22–32)
Chloride: 103 mmol/L (ref 101–111)
Creatinine, Ser: 0.9 mg/dL (ref 0.61–1.24)
GLUCOSE: 96 mg/dL (ref 65–99)
Potassium: 4.1 mmol/L (ref 3.5–5.1)
Sodium: 137 mmol/L (ref 135–145)

## 2018-01-11 LAB — GLUCOSE, CAPILLARY
GLUCOSE-CAPILLARY: 83 mg/dL (ref 65–99)
GLUCOSE-CAPILLARY: 93 mg/dL (ref 65–99)
Glucose-Capillary: 111 mg/dL — ABNORMAL HIGH (ref 65–99)
Glucose-Capillary: 103 mg/dL — ABNORMAL HIGH (ref 65–99)

## 2018-01-11 LAB — CBC
HCT: 32.4 % — ABNORMAL LOW (ref 39.0–52.0)
Hemoglobin: 10.7 g/dL — ABNORMAL LOW (ref 13.0–17.0)
MCH: 32.1 pg (ref 26.0–34.0)
MCHC: 33 g/dL (ref 30.0–36.0)
MCV: 97.3 fL (ref 78.0–100.0)
PLATELETS: 423 10*3/uL — AB (ref 150–400)
RBC: 3.33 MIL/uL — ABNORMAL LOW (ref 4.22–5.81)
RDW: 14.6 % (ref 11.5–15.5)
WBC: 21.5 10*3/uL — AB (ref 4.0–10.5)

## 2018-01-11 LAB — MAGNESIUM: Magnesium: 2.1 mg/dL (ref 1.7–2.4)

## 2018-01-11 LAB — PHOSPHORUS: Phosphorus: 4.2 mg/dL (ref 2.5–4.6)

## 2018-01-11 MED ORDER — DEXTROSE 5 % IV SOLN
2.0000 g | INTRAVENOUS | Status: DC
  Administered 2018-01-11 – 2018-01-14 (×4): 2 g via INTRAVENOUS
  Filled 2018-01-11 (×6): qty 2

## 2018-01-11 MED ORDER — POTASSIUM CHLORIDE IN NACL 20-0.9 MEQ/L-% IV SOLN
INTRAVENOUS | Status: DC
  Administered 2018-01-11 – 2018-01-17 (×2): via INTRAVENOUS
  Filled 2018-01-11: qty 1000

## 2018-01-11 MED ORDER — HYDROMORPHONE HCL 1 MG/ML IJ SOLN
0.5000 mg | Freq: Once | INTRAMUSCULAR | Status: AC
  Administered 2018-01-11: 0.5 mg via INTRAVENOUS
  Filled 2018-01-11: qty 0.5

## 2018-01-11 MED ORDER — TRAVASOL 10 % IV SOLN
INTRAVENOUS | Status: AC
  Administered 2018-01-11: 17:00:00 via INTRAVENOUS
  Filled 2018-01-11: qty 1094.4

## 2018-01-11 MED ORDER — METRONIDAZOLE IN NACL 5-0.79 MG/ML-% IV SOLN
500.0000 mg | Freq: Three times a day (TID) | INTRAVENOUS | Status: DC
  Administered 2018-01-11 – 2018-01-15 (×12): 500 mg via INTRAVENOUS
  Filled 2018-01-11 (×14): qty 100

## 2018-01-11 NOTE — Progress Notes (Signed)
    [facility]   Reason for visit: Follow up on abdominal abscess  Interval History: no new complaints; no fever, WBC up to 21. Asking when he can go home; no associated rash Day 3 amp/sulbactam Day 10 total antibiotics    Physical Exam: Constitutional:  Vitals:   01/11/18 0511 01/11/18 1019  BP: (!) 100/57 96/60  Pulse: 82 93  Resp: 20 18  Temp: 98.5 F (36.9 C) 98.4 F (36.9 C)  SpO2: 97% 96%   patient appears in NAD, more alert today Eyes: anicteric HENT: no thrush; staples on right head, no surrounding erythema Respiratory: Normal respiratory effort; CTA B Cardiovascular: RRR GI: soft, nt, nd  Review of Systems: Constitutional: negative for fevers and chills Gastrointestinal: negative for nausea and diarrhea Integument/breast: negative for rash  Lab Results  Component Value Date   WBC 21.5 (H) 01/11/2018   HGB 10.7 (L) 01/11/2018   HCT 32.4 (L) 01/11/2018   MCV 97.3 01/11/2018   PLT 423 (H) 01/11/2018    Lab Results  Component Value Date   CREATININE 0.90 01/11/2018   BUN <5 (L) 01/11/2018   NA 137 01/11/2018   K 4.1 01/11/2018   CL 103 01/11/2018   CO2 24 01/11/2018    Lab Results  Component Value Date   ALT 15 (L) 01/10/2018   AST 32 01/10/2018   ALKPHOS 74 01/10/2018     Microbiology: Recent Results (from the past 240 hour(s))  CSF culture     Status: None   Collection Time: 01/01/18  7:37 PM  Result Value Ref Range Status   Specimen Description CSF  Final   Special Requests SHUNT  Final   Gram Stain   Final    WBC PRESENT, PREDOMINANTLY MONONUCLEAR NO ORGANISMS SEEN CYTOSPIN SMEAR    Culture NO GROWTH 3 DAYS  Final   Report Status 01/06/2018 FINAL  Final  Aerobic/Anaerobic Culture (surgical/deep wound)     Status: None   Collection Time: 01/01/18  7:39 PM  Result Value Ref Range Status   Specimen Description WOUND  Final   Special Requests VP SHUNT FROM BRAIN  Final   Gram Stain   Final    ABUNDANT WBC  PRESENT,BOTH PMN AND MONONUCLEAR NO ORGANISMS SEEN    Culture   Final    FEW HAEMOPHILUS INFLUENZAE BETA LACTAMASE NEGATIVE NO ANAEROBES ISOLATED CRITICAL RESULT CALLED TO, READ BACK BY AND VERIFIED WITH: A ELLIOTT,RN AT 1623 01/05/18 BY L BENFIELD CONCERNING GROWTH ON CULTURE    Report Status 01/07/2018 FINAL  Final  C difficile quick scan w PCR reflex     Status: None   Collection Time: 01/09/18  9:36 AM  Result Value Ref Range Status   C Diff antigen NEGATIVE NEGATIVE Final   C Diff toxin NEGATIVE NEGATIVE Final   C Diff interpretation No C. difficile detected.  Final    Impression/Plan:  1. Abdominal abscess - seems stable but I will change to ceftriaxone and flagyl with increase in WBC.    2.  HIV - odefsey, Tivicay.  Will continue on same and is tolerating well. CD4 400, viral load < 20.    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    Dr. Drue SecondSnider is available over the weekend if needed, otherwise I will follow up on Monday

## 2018-01-11 NOTE — Progress Notes (Signed)
[facility] PROGRESS NOTE  Justin Ball ZOX:[id number] DOB: [birth year] DOA: 12/30/2017 PCP: No primary care provider on file.  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 Patient was seen by the PCP several days prior to admission with history of rash along his abd wall where his VP shunt was. Patient was initially placed on bactrim. The rash got worse and was tracking along the VP shunt location (under his skin along his chest wall). Patient was admitted with sepsis, and VP shunt infection. Patient has undergone VP shunt removal and placement of EVD (on 01/01/18) by Neurosurgery. Patient also developed ileus secondary to VP shunt infection.    Assessment/Plan:  Sepsis. likely related to shunt infection. Presented with fever, leukocytosis, tachycardia, tachypnea.  Neurosurgery consulted. Patient underwent  VP shunt removal, placement of EVD on 1-08. Blood culture grew Staph Epi and Strept Pneumonia. CSF culture: NGTD. Culture VP shunt from brain grew Haemophilus.  -improved IV Vanco, as well as Medication for HIV and Prophylaxis (Bactrim and fluconazole). Cont antibiotics per ID. Appreciate ID, neurosurgery input  Finished treatment with vancomycin for Staph epi bacteremia.   Ileus. Abdominal Pain; Nausea, vomiting. Multiples intra-abdominal abscess.   General surgery following.  CT abdomen with intra-abdominal abscess.  recommendations.  IR consulted didn't recommended drainage placement.  -Continue with TPN.  -NGT came out. Sx recommend sips of clears.  -started on Unasyn 1-16 -WBC at 20.   AIDS: Continue with Tivicay, Odefsey. ID following. On Fluconazole for history of cryptococcus Meningitis.   Seizure: Continue with Keppra.   Abnormal electrolytes. Cont replacing , monitor labs    S/P VP Shunt. Neurosurgery has been consulted due to infection. He had VP shunt for cryptococcus  meningitis in the past  -VP shunt has been removed by neurosurgery. EVD discontinued on 11/15. Cont per neurosurgery.  -CT head stable per neurosurgery   Chronic viral hepatitis. Bipolar; on Seroquel.     Code Status: full Family Communication: d/w patient, Disposition Plan: pend clinical improvement    Consultants:  ID  neurosurg  Procedures:  VP shunt removal   Antibiotics: Anti-infectives (From admission, onward)   Start     Dose/Rate Route Frequency Ordered Stop   01/09/18 1800  Ampicillin-Sulbactam (UNASYN) 3 g in sodium chloride 0.9 % 100 mL IVPB     3 g 200 mL/hr over 30 Minutes Intravenous Every 6 hours 01/09/18 1730     01/07/18 1314  vancomycin (VANCOCIN) IVPB 750 mg/150 ml premix  Status:  Discontinued     750 mg 150 mL/hr over 60 Minutes Intravenous Every 8 hours 01/07/18 1314 01/10/18 1051   01/05/18 0500  vancomycin (VANCOCIN) IVPB 1000 mg/200 mL premix  Status:  Discontinued     1,000 mg 200 mL/hr over 60 Minutes Intravenous Every 8 hours 01/04/18 1349 01/07/18 1314   01/04/18 1000  fluconazole (DIFLUCAN) tablet 200 mg     200 mg Oral Daily 01/04/18 0844     01/03/18 1000  fluconazole (DIFLUCAN) IVPB 200 mg  Status:  Discontinued     200 mg 100 mL/hr over 60 Minutes Intravenous Every 24 hours 01/02/18 1525 01/04/18 0844   01/02/18 1100  vancomycin (VANCOCIN) 1,250 mg in sodium chloride 0.9 % 250 mL IVPB  Status:  Discontinued     1,250 mg 166.7 mL/hr over 90 Minutes Intravenous Every 8 hours 01/02/18 1025 01/04/18 1137   01/02/18 0900  sulfamethoxazole-trimethoprim (BACTRIM DS,SEPTRA DS) 800-160 MG per tablet  1 tablet     1 tablet Oral Once per day on Mon Wed Fri 12/31/17 1003     01/01/18 0930  fluconazole (DIFLUCAN) IVPB 400 mg  Status:  Discontinued     400 mg 100 mL/hr over 120 Minutes Intravenous Every 24 hours 01/01/18 0830 01/02/18 1525   12/31/17 2200  vancomycin (VANCOCIN) IVPB 1000 mg/200 mL premix  Status:  Discontinued     1,000 mg 200  mL/hr over 60 Minutes Intravenous Every 8 hours 12/31/17 2048 01/02/18 1025   12/31/17 1400  cefTAZidime (FORTAZ) 2 g in dextrose 5 % 50 mL IVPB  Status:  Discontinued     2 g 100 mL/hr over 30 Minutes Intravenous Every 8 hours 12/31/17 1119 01/01/18 0808   12/31/17 1200  dolutegravir (TIVICAY) tablet 50 mg     50 mg Oral Daily 12/31/17 0207     12/31/17 1200  emtricitabine-rilpivir-tenofovir AF (ODEFSEY) 200-25-25 MG per tablet 1 tablet     1 tablet Oral Daily with breakfast 12/31/17 0207     12/31/17 1100  fluconazole (DIFLUCAN) tablet 400 mg  Status:  Discontinued     400 mg Oral Daily 12/31/17 1003 01/01/18 0829   12/31/17 1000  vancomycin (VANCOCIN) IVPB 1000 mg/200 mL premix  Status:  Discontinued     1,000 mg 200 mL/hr over 60 Minutes Intravenous 2 times daily 12/31/17 0444 12/31/17 2048   12/31/17 0215  sulfamethoxazole-trimethoprim (BACTRIM DS,SEPTRA DS) 800-160 MG per tablet 1 tablet  Status:  Discontinued     1 tablet Oral 2 times daily 12/31/17 0207 12/31/17 1003   12/31/17 0215  cefTRIAXone (ROCEPHIN) 2 g in dextrose 5 % 50 mL IVPB  Status:  Discontinued     2 g 100 mL/hr over 30 Minutes Intravenous Every 24 hours 12/31/17 0207 12/31/17 1119   12/30/17 2345  vancomycin (VANCOCIN) 1,500 mg in sodium chloride 0.9 % 500 mL IVPB     1,500 mg 250 mL/hr over 120 Minutes Intravenous  Once 12/30/17 2331 12/31/17 0324   12/30/17 2330  cefTRIAXone (ROCEPHIN) 2 g in dextrose 5 % 50 mL IVPB     2 g 100 mL/hr over 30 Minutes Intravenous  Once 12/30/17 2328 12/31/17 0122        HPI/Subjective: Alert, he is getting ready to walk with PT>  NG tube came out. He denies  abdominal pain   Objective: Vitals:   01/11/18 0511 01/11/18 1019  BP: (!) 100/57 96/60  Pulse: 82 93  Resp: 20 18  Temp: 98.5 F (36.9 C) 98.4 F (36.9 C)  SpO2: 97% 96%    Intake/Output Summary (Last 24 hours) at 01/11/2018 1314 Last data filed at 01/11/2018 0300 Gross per 24 hour  Intake 1969.75 ml  Output  3400 ml  Net -1430.25 ml   Filed Weights   12/30/17 1808 12/31/17 0758  Weight: 81.6 kg (180 lb) 78.8 kg (173 lb 11.6 oz)    Exam:   General:  NAD, NG tube in place.   Cardiovascular: S 1, S 2 RRR  Respiratory: CTA  Abdomen: BS decreased, mild tenderness,   Musculoskeletal: no edema  Data Reviewed: Basic Metabolic Panel: Recent Labs  Lab 01/06/18 0338 01/06/18 2244 01/08/18 0423 01/09/18 0712 01/10/18 1056 01/11/18 0416  NA 138 136 136 136 136 137  K 2.9* 3.2* 3.5 3.3* 3.7 4.1  CL 105 105 104 104 104 103  CO2 24 22 21* 24 23 24   GLUCOSE 89 83 87 119* 93 96  BUN <5* <  5* <5* <5* <5* <5*  CREATININE 0.70 0.84 0.86 0.85 0.48* 0.90  CALCIUM 8.1* 8.2* 8.4* 8.4* 8.6* 8.9  MG 2.0  --   --  1.6* 1.8 2.1  PHOS 2.7 2.8  --  3.1 3.7 4.2   Liver Function Tests: Recent Labs  Lab 01/05/18 0305 01/06/18 0338 01/06/18 2244 01/09/18 0712 01/10/18 1056  AST  --   --   --  31 32  ALT  --   --   --  18 15*  ALKPHOS  --   --   --  69 74  BILITOT  --   --   --  1.7* 1.4*  PROT  --   --   --  7.0 7.4  ALBUMIN 2.3* 2.1* 2.2* 2.3* 2.6*   No results for input(s): LIPASE, AMYLASE in the last 168 hours. No results for input(s): AMMONIA in the last 168 hours. CBC: Recent Labs  Lab 01/05/18 0305 01/06/18 0338 01/08/18 0423 01/09/18 0712 01/10/18 1043 01/11/18 0416  WBC 22.3* 24.9* 17.8* 17.8* 22.8* 21.5*  NEUTROABS 18.6* 22.4*  --  14.3*  --   --   HGB 10.9* 10.3* 11.1* 10.3* 10.3* 10.7*  HCT 33.7* 31.7* 33.9* 31.7* 31.7* 32.4*  MCV 96.6 95.5 95.2 95.8 96.9 97.3  PLT 400 432* 468* 451* 424* 423*   Cardiac Enzymes: No results for input(s): CKTOTAL, CKMB, CKMBINDEX, TROPONINI in the last 168 hours. BNP (last 3 results) No results for input(s): BNP in the last 8760 hours.  ProBNP (last 3 results) No results for input(s): PROBNP in the last 8760 hours.  CBG: Recent Labs  Lab 01/10/18 1143 01/10/18 1654 01/11/18 0010 01/11/18 0606 01/11/18 1140  GLUCAP 88 98 93  111* 103*    Recent Results (from the past 240 hour(s))  Surgical pcr screen     Status: None   Collection Time: 01/01/18  2:08 PM  Result Value Ref Range Status   MRSA, PCR NEGATIVE NEGATIVE Final   Staphylococcus aureus NEGATIVE NEGATIVE Final    Comment: (NOTE) The Xpert SA Assay (FDA approved for NASAL specimens in patients 47 years of age and older), is one component of a comprehensive surveillance program. It is not intended to diagnose infection nor to guide or monitor treatment.   CSF culture     Status: None   Collection Time: 01/01/18  7:37 PM  Result Value Ref Range Status   Specimen Description CSF  Final   Special Requests SHUNT  Final   Gram Stain   Final    WBC PRESENT, PREDOMINANTLY MONONUCLEAR NO ORGANISMS SEEN CYTOSPIN SMEAR    Culture NO GROWTH 3 DAYS  Final   Report Status 01/06/2018 FINAL  Final  Aerobic/Anaerobic Culture (surgical/deep wound)     Status: None   Collection Time: 01/01/18  7:39 PM  Result Value Ref Range Status   Specimen Description WOUND  Final   Special Requests VP SHUNT FROM BRAIN  Final   Gram Stain   Final    ABUNDANT WBC PRESENT,BOTH PMN AND MONONUCLEAR NO ORGANISMS SEEN    Culture   Final    FEW HAEMOPHILUS INFLUENZAE BETA LACTAMASE NEGATIVE NO ANAEROBES ISOLATED CRITICAL RESULT CALLED TO, READ BACK BY AND VERIFIED WITH: A ELLIOTT,RN AT 1623 01/05/18 BY L BENFIELD CONCERNING GROWTH ON CULTURE    Report Status 01/07/2018 FINAL  Final  C difficile quick scan w PCR reflex     Status: None   Collection Time: 01/09/18  9:36 AM  Result Value  Ref Range Status   C Diff antigen NEGATIVE NEGATIVE Final   C Diff toxin NEGATIVE NEGATIVE Final   C Diff interpretation No C. difficile detected.  Final     Studies: Ct Abdomen Pelvis W Contrast  Result Date: 01/09/2018 CLINICAL DATA:  Evaluate ileus. New onset diarrhea. No pain or nausea. EXAM: CT ABDOMEN AND PELVIS WITH CONTRAST TECHNIQUE: Multidetector CT imaging of the abdomen and  pelvis was performed using the standard protocol following bolus administration of intravenous contrast. CONTRAST:  ISOVUE-300 IOPAMIDOL (ISOVUE-300) INJECTION 61% COMPARISON:  CT abdomen pelvis dated December 30, 2017. FINDINGS: Lower chest: New small left pleural effusion with adjacent left basilar atelectasis. Hepatobiliary: No focal liver abnormality is seen. No gallstones, gallbladder wall thickening, or biliary dilatation. Pancreas: Unremarkable. No pancreatic ductal dilatation or surrounding inflammatory changes. Spleen: Normal in size without focal abnormality. Adrenals/Urinary Tract: Adrenal glands are unremarkable. Kidneys are normal, without renal calculi, focal lesion, or hydronephrosis. Bladder is decompressed by a suprapubic catheter. Stomach/Bowel: Nasogastric tube within the stomach. There are multiple moderately dilated loops of mid small bowel, with a transition point seen in the lower midline pelvis (series 3, image 73). The loops of ileum in the pelvis appear tethered. New mild wall thickening of the distal sigmoid colon and rectum. Unchanged wall thickening at the descending/sigmoid colon junction. Prior appendectomy. Vascular/Lymphatic: Prominent retroperitoneal lymph nodes measuring up to 1.1 cm are unchanged. No significant vascular findings. Reproductive: Prostate is unremarkable. Other: There are two new rim enhancing fluid collections in the pelvis, along the previous pathway of the ventriculoperitoneal shunt catheter, which has since been removed. One fluid collection is between loops of ileum and measures 3.1 x 1.6 x 2.0 cm (AP by transverse by CC). The other is just anterior to the rectum and measures approximately 3.6 x 4.5 x 5.0 cm (AP by transverse by CC). There is also a small rim enhancing fluid collection in the right anterior lower chest/upper abdominal wall at the site of the removed shunt catheter, measuring approximately 0.7 x 1.3 x 8.3 cm (AP by transverse by CC). No  pneumoperitoneum. Musculoskeletal: No acute or significant osseous findings. IMPRESSION: 1. Interval removal of the ventriculoperitoneal shunt catheter, with two new rim enhancing fluid collections in the lower midline pelvis measuring up to 5.0 cm, along the course of the now removed shunt catheter, concerning for abscesses. 2. Moderately dilated loops of small bowel with a transition point seen in the lower midline pelvis adjacent to one of the abscesses, suggesting small bowel obstruction. The decompressed ileal loops in the pelvis may possibly be tethered secondary to the pelvic inflammatory process. 3. New wall thickening of the distal sigmoid colon and rectum, possibly reactive. Unchanged wall thickening at the descending/sigmoid colon junction. 4. Additional small rim enhancing fluid collection in the right lower anterior chest wall subcutaneous tissues at the site of the now removed shunt catheter. 5. Small left pleural effusion. Electronically Signed   By: Obie Dredge M.D.   On: 01/09/2018 17:09    Scheduled Meds: . chlorhexidine  15 mL Mouth Rinse BID  . Chlorhexidine Gluconate Cloth  6 each Topical Daily  . dolutegravir  50 mg Oral Daily  . emtricitabine-rilpivir-tenofovir AF  1 tablet Oral Q breakfast  . fluconazole  200 mg Oral Daily  . insulin aspart  0-9 Units Subcutaneous Q6H  . mouth rinse  15 mL Mouth Rinse q12n4p  . metoCLOPramide (REGLAN) injection  10 mg Intravenous Q6H  . sodium chloride flush  10-40 mL  Intracatheter Q12H  . sulfamethoxazole-trimethoprim  1 tablet Oral Once per day on Mon Wed Fri   Continuous Infusions: . 0.9 % NaCl with KCl 20 mEq / L 35 mL/hr at 01/10/18 1855  . 0.9 % NaCl with KCl 20 mEq / L    . ampicillin-sulbactam (UNASYN) IV Stopped (01/11/18 0830)  . levETIRAcetam 1,000 mg (01/11/18 0927)  . TPN ADULT (ION) 60 mL/hr at 01/10/18 1710  . 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 If 7PM-7AM, please contact night-coverage at [email or web address], password [email or web address] 01/11/2018, 1:14 PM  LOS: 11 days

## 2018-01-11 NOTE — Progress Notes (Signed)
[facility] Progress Note  10 Days Post-Op  Subjective: CC: ileus Patient with NGT almost completely out of nose this AM. Was clamped overnight, denies nausea or vomiting. Denies abdominal pain. Reports to me that he was worried someone was trying to send him back to school, alert and oriented to person/place/time/situation. No more diarrhea.   Objective: Vital signs in last 24 hours: Temp:  [97.7 F (36.5 C)-98.9 F (37.2 C)] 98.5 F (36.9 C) (01/18 0511) Pulse Rate:  [78-96] 82 (01/18 0511) Resp:  [20] 20 (01/18 0511) BP: (100-106)/(57-72) 100/57 (01/18 0511) SpO2:  [97 %-100 %] 97 % (01/18 0511) Last BM Date: 01/10/18  Intake/Output from previous day: 01/17 0701 - 01/18 0700 In: 2869.8 [I.V.:1959.8; IV Piggyback:910] Out: 4550 [Urine:4550] Intake/Output this shift: No intake/output data recorded.  PE: Gen:  Alert, NAD, pleasant Card:  Regular rate and rhythm, pedal pulses 2+ BL Pulm:  Normal effort, clear to auscultation bilaterally Abd: Soft, non-tender, mildly distended, bowel sounds hypoactive Skin: warm and dry, no rashes  Psych: A&Ox3   Lab Results:  Recent Labs    01/10/18 1043 01/11/18 0416  WBC 22.8* 21.5*  HGB 10.3* 10.7*  HCT 31.7* 32.4*  PLT 424* 423*   BMET Recent Labs    01/10/18 1056 01/11/18 0416  NA 136 137  K 3.7 4.1  CL 104 103  CO2 23 24  GLUCOSE 93 96  BUN <5* <5*  CREATININE 0.48* 0.90  CALCIUM 8.6* 8.9   PT/INR No results for input(s): LABPROT, INR in the last 72 hours. CMP     Component Value Date/Time   NA 137 01/11/2018 0416   K 4.1 01/11/2018 0416   CL 103 01/11/2018 0416   CO2 24 01/11/2018 0416   GLUCOSE 96 01/11/2018 0416   BUN <5 (L) 01/11/2018 0416   CREATININE 0.90 01/11/2018 0416   CREATININE 0.92 09/13/2017 1649   CALCIUM 8.9 01/11/2018 0416   PROT 7.4 01/10/2018 1056   ALBUMIN 2.6 (L) 01/10/2018 1056   AST 32 01/10/2018 1056   ALT 15 (L) 01/10/2018 1056   ALT 38 07/10/2017 1152   ALKPHOS  74 01/10/2018 1056   BILITOT 1.4 (H) 01/10/2018 1056   GFRNONAA >60 01/11/2018 0416   GFRNONAA 114 09/13/2017 1649   GFRAA >60 01/11/2018 0416   GFRAA 133 09/13/2017 1649   Lipase     Component Value Date/Time   LIPASE 16 05/18/2017 1941       Studies/Results: Ct Abdomen Pelvis W Contrast  Result Date: 01/09/2018 CLINICAL DATA:  Evaluate ileus. New onset diarrhea. No pain or nausea. EXAM: CT ABDOMEN AND PELVIS WITH CONTRAST TECHNIQUE: Multidetector CT imaging of the abdomen and pelvis was performed using the standard protocol following bolus administration of intravenous contrast. CONTRAST:  ISOVUE-300 IOPAMIDOL (ISOVUE-300) INJECTION 61% COMPARISON:  CT abdomen pelvis dated December 30, 2017. FINDINGS: Lower chest: New small left pleural effusion with adjacent left basilar atelectasis. Hepatobiliary: No focal liver abnormality is seen. No gallstones, gallbladder wall thickening, or biliary dilatation. Pancreas: Unremarkable. No pancreatic ductal dilatation or surrounding inflammatory changes. Spleen: Normal in size without focal abnormality. Adrenals/Urinary Tract: Adrenal glands are unremarkable. Kidneys are normal, without renal calculi, focal lesion, or hydronephrosis. Bladder is decompressed by a suprapubic catheter. Stomach/Bowel: Nasogastric tube within the stomach. There are multiple moderately dilated loops of mid small bowel, with a transition point seen in the lower midline pelvis (series 3, image 73). The loops of ileum in the pelvis appear tethered. New mild wall thickening  of the distal sigmoid colon and rectum. Unchanged wall thickening at the descending/sigmoid colon junction. Prior appendectomy. Vascular/Lymphatic: Prominent retroperitoneal lymph nodes measuring up to 1.1 cm are unchanged. No significant vascular findings. Reproductive: Prostate is unremarkable. Other: There are two new rim enhancing fluid collections in the pelvis, along the previous pathway of the  ventriculoperitoneal shunt catheter, which has since been removed. One fluid collection is between loops of ileum and measures 3.1 x 1.6 x 2.0 cm (AP by transverse by CC). The other is just anterior to the rectum and measures approximately 3.6 x 4.5 x 5.0 cm (AP by transverse by CC). There is also a small rim enhancing fluid collection in the right anterior lower chest/upper abdominal wall at the site of the removed shunt catheter, measuring approximately 0.7 x 1.3 x 8.3 cm (AP by transverse by CC). No pneumoperitoneum. Musculoskeletal: No acute or significant osseous findings. IMPRESSION: 1. Interval removal of the ventriculoperitoneal shunt catheter, with two new rim enhancing fluid collections in the lower midline pelvis measuring up to 5.0 cm, along the course of the now removed shunt catheter, concerning for abscesses. 2. Moderately dilated loops of small bowel with a transition point seen in the lower midline pelvis adjacent to one of the abscesses, suggesting small bowel obstruction. The decompressed ileal loops in the pelvis may possibly be tethered secondary to the pelvic inflammatory process. 3. New wall thickening of the distal sigmoid colon and rectum, possibly reactive. Unchanged wall thickening at the descending/sigmoid colon junction. 4. Additional small rim enhancing fluid collection in the right lower anterior chest wall subcutaneous tissues at the site of the now removed shunt catheter. 5. Small left pleural effusion. Electronically Signed   By: Obie Dredge M.D.   On: 01/09/2018 17:09    Anti-infectives: Anti-infectives (From admission, onward)   Start     Dose/Rate Route Frequency Ordered Stop   01/09/18 1800  Ampicillin-Sulbactam (UNASYN) 3 g in sodium chloride 0.9 % 100 mL IVPB     3 g 200 mL/hr over 30 Minutes Intravenous Every 6 hours 01/09/18 1730     01/07/18 1314  vancomycin (VANCOCIN) IVPB 750 mg/150 ml premix  Status:  Discontinued     750 mg 150 mL/hr over 60 Minutes  Intravenous Every 8 hours 01/07/18 1314 01/10/18 1051   01/05/18 0500  vancomycin (VANCOCIN) IVPB 1000 mg/200 mL premix  Status:  Discontinued     1,000 mg 200 mL/hr over 60 Minutes Intravenous Every 8 hours 01/04/18 1349 01/07/18 1314   01/04/18 1000  fluconazole (DIFLUCAN) tablet 200 mg     200 mg Oral Daily 01/04/18 0844     01/03/18 1000  fluconazole (DIFLUCAN) IVPB 200 mg  Status:  Discontinued     200 mg 100 mL/hr over 60 Minutes Intravenous Every 24 hours 01/02/18 1525 01/04/18 0844   01/02/18 1100  vancomycin (VANCOCIN) 1,250 mg in sodium chloride 0.9 % 250 mL IVPB  Status:  Discontinued     1,250 mg 166.7 mL/hr over 90 Minutes Intravenous Every 8 hours 01/02/18 1025 01/04/18 1137   01/02/18 0900  sulfamethoxazole-trimethoprim (BACTRIM DS,SEPTRA DS) 800-160 MG per tablet 1 tablet     1 tablet Oral Once per day on Mon Wed Fri 12/31/17 1003     01/01/18 0930  fluconazole (DIFLUCAN) IVPB 400 mg  Status:  Discontinued     400 mg 100 mL/hr over 120 Minutes Intravenous Every 24 hours 01/01/18 0830 01/02/18 1525   12/31/17 2200  vancomycin (VANCOCIN) IVPB 1000 mg/200 mL  premix  Status:  Discontinued     1,000 mg 200 mL/hr over 60 Minutes Intravenous Every 8 hours 12/31/17 2048 01/02/18 1025   12/31/17 1400  cefTAZidime (FORTAZ) 2 g in dextrose 5 % 50 mL IVPB  Status:  Discontinued     2 g 100 mL/hr over 30 Minutes Intravenous Every 8 hours 12/31/17 1119 01/01/18 0808   12/31/17 1200  dolutegravir (TIVICAY) tablet 50 mg     50 mg Oral Daily 12/31/17 0207     12/31/17 1200  emtricitabine-rilpivir-tenofovir AF (ODEFSEY) 200-25-25 MG per tablet 1 tablet     1 tablet Oral Daily with breakfast 12/31/17 0207     12/31/17 1100  fluconazole (DIFLUCAN) tablet 400 mg  Status:  Discontinued     400 mg Oral Daily 12/31/17 1003 01/01/18 0829   12/31/17 1000  vancomycin (VANCOCIN) IVPB 1000 mg/200 mL premix  Status:  Discontinued     1,000 mg 200 mL/hr over 60 Minutes Intravenous 2 times daily  12/31/17 0444 12/31/17 2048   12/31/17 0215  sulfamethoxazole-trimethoprim (BACTRIM DS,SEPTRA DS) 800-160 MG per tablet 1 tablet  Status:  Discontinued     1 tablet Oral 2 times daily 12/31/17 0207 12/31/17 1003   12/31/17 0215  cefTRIAXone (ROCEPHIN) 2 g in dextrose 5 % 50 mL IVPB  Status:  Discontinued     2 g 100 mL/hr over 30 Minutes Intravenous Every 24 hours 12/31/17 0207 12/31/17 1119   12/30/17 2345  vancomycin (VANCOCIN) 1,500 mg in sodium chloride 0.9 % 500 mL IVPB     1,500 mg 250 mL/hr over 120 Minutes Intravenous  Once 12/30/17 2331 12/31/17 0324   12/30/17 2330  cefTRIAXone (ROCEPHIN) 2 g in dextrose 5 % 50 mL IVPB     2 g 100 mL/hr over 30 Minutes Intravenous  Once 12/30/17 2328 12/31/17 0122       Assessment/Plan Infected VP shunt s/p removal Ileus Intra-abd fluid collections/abscesses  Ct showed several intra-abd fluid collections suggestive of abscesses with reactive SB wall thickening. This explains ongoing ileus.  -IR not recommending drainage -would not recommend surgical exploration for this -agree with expansion of abx - but will defer to ID -NGT removed, start sips of clears  FEN: CLD;  cont TPN VTE: SCDs ID: vanc 1/12>>, PO bactrim 1/9>>, PO diflucan 1/9>>, unasyn 1/16; c diff negative 1/16  Cont TPN Ambulate Keep K close to 4    LOS: 11 days    Wells GuilesKelly Rayburn , [facility] 01/11/2018, 9:51 AM Pager: [phone] Consults: [phone] Mon-Fri 7:00 am-4:30 pm Sat-Sun 7:00 am-11:30 am

## 2018-01-11 NOTE — Progress Notes (Signed)
PHARMACY - ADULT TOTAL PARENTERAL NUTRITION CONSULT NOTE   Pharmacy Consult:  TPN Indication:  Ileus  Patient Measurements: Height: 6\' 1"  (185.4 cm) Weight: 173 lb 11.6 oz (78.8 kg) IBW/kg (Calculated) : 79.9 TPN AdjBW (KG): 78.8 Body mass index is 22.92 kg/m.  Assessment:  [age] YOM with history of neurogenic bowel presented on 12/31/17 with fever and rash.  He was also having nausea and vomiting with hypoactive bowel sounds and distended abdomen on 01/01/18.  Diagnosed with ileus secondary to infected VP shunt.  Patient tolerated intermittent clamping of NG tube and consumed some clear liquids.  On 01/08/18, he vomited a large amount of green/clear bile.  Pharmacy consulted to manage TPN.  Patient reports no recent weight loss/gain and he was eating regularly PTA.  GI: baseline prealbumin low at 8, NG O/P down to 400mL, stool O/P up to 450mL.  1/16 CT showed SBO - Reglan IV. LBM 1/17. Endo: no hx DM - CBGs low normal Insulin requirements in the past 24 hours: 0 unit SSI  Lytes: ?mild refeeding - K+ improved to 4.1 (goal >/= 4 for ileus), Mag improved to 2.1 (goal >/= 2 for ileus), others WNL Renal: neurogenic bladder - SCr 0.90 stable, BUN <5.  UOP 2.4 ml/kg/hr, NS 20K at 65 ml/hr Pulm: stable on RA Cards: VSS Hepatobil: Hep B - LFTs WNL, tbili mildly elevated at 1.4.  TG WNL. 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  Unasyn (1/16 >>) for cellulitis/abdominal abscess.  Afebrile, WBC up 21.5 - may broaden to ceftriaxone and flagyl. 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  TPN will provide 110g AA, 326g CHO and 48g ILE, which equals to 2027 kCal, meeting ~100% of protein needs and 91% of kcal needs.  Electrolytes in TPN:  Na/K/Mag increased 1/16, no change to Phos, Cl:Ac 1:2 Daily multivitamin and trace elements in TPN Reduce NS 20K to 10 ml/hr  Continue sensitive SSI Q6H.  D/C if CBGs remain controlled at goal TPN rate. F/U AM labs   Justin SnufferJessica Tauheedah Ball, PharmD, BCPS, BCCCP Clinical Pharmacist Clinical phone 01/11/2018 until 3:30PM - [phone] After hours, please call #[phone] 01/11/2018, 7:18 AM

## 2018-01-11 NOTE — Progress Notes (Signed)
Physical Therapy Treatment Patient Details Name: Justin Ball MRN: [id number] DOB: 01-11-1991 Today's Date: 01/11/2018    History of Present Illness 27 y.o. 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 Pt is currently supervision for bed mobility, minA for transfers and ambulation of 150 feet with RW. D/c plans remain appropriate as long and he will have 24 hr/day assistance. PT will continue to follow acutely.   Follow Up Recommendations  No PT follow up;Supervision/Assistance - 24 hour     Equipment Recommendations  None recommended by PT    Recommendations for Other Services       Precautions / Restrictions Precautions Precautions: Fall Precaution Comments: flexiseal Restrictions Weight Bearing Restrictions: No    Mobility  Bed Mobility Overal bed mobility: Needs Assistance Bed Mobility: Supine to Sit     Supine to sit: Supervision     General bed mobility comments: pt able to pull self to EoB with use of bedrails   Transfers Overall transfer level: Needs assistance Equipment used: Rolling walker (2 wheeled) Transfers: Sit to/from Stand Sit to Stand: Min assist         General transfer comment: minA for steadying with powerup, good steadying  Ambulation/Gait Ambulation/Gait assistance: Min assist Ambulation Distance (Feet): 150 Feet Assistive device: Rolling walker (2 wheeled)(pushed IV pole) Gait Pattern/deviations: Step-through pattern;Decreased stride length;Narrow base of support;Drifts right/left Gait velocity: slow Gait velocity interpretation: Below normal speed for age/gender General Gait Details: minA for  steadying in RW, vc for increased BoS and increased foot clearance, as well as for drifting to the R        Balance Overall balance assessment: Needs assistance Sitting-balance support: Feet unsupported Sitting balance-Leahy Scale: Fair Sitting balance - Comments: pt able to don socks   Standing balance support: No upper extremity supported Standing balance-Leahy Scale: Fair                              Cognition Arousal/Alertness: Awake/alert Behavior During Therapy: Flat affect Overall Cognitive Status: Impaired/Different from baseline Area of Impairment: Problem solving                   Current Attention Level: Sustained Memory: Decreased short-term memory Following Commands: Follows multi-step commands with increased time Safety/Judgement: Decreased awareness of safety;Decreased awareness of deficits   Problem Solving: Slow processing;Decreased initiation;Requires verbal cues;Difficulty sequencing;Requires tactile cues General Comments: increased time for response but able to follow commands         General Comments General comments (skin integrity, edema, etc.): pt excited to get back to room to eat some jello as he has been NPO      Pertinent Vitals/Pain Pain Assessment: Faces Faces Pain Scale: Hurts a little bit Pain Location: stomach Pain Descriptors / Indicators: Guarding;Grimacing Pain Intervention(s): Premedicated before session;Monitored during session;Limited activity within patient's tolerance;Repositioned           PT Goals (current goals can now be found in the care plan section) Acute Rehab PT Goals PT Goal Formulation: With patient/family Time For Goal Achievement: 01/21/18 Potential to Achieve Goals: Good Progress towards PT goals: Progressing toward goals  Frequency    Min 3X/week      PT Plan Current plan remains appropriate    Co-evaluation              AM-PAC PT "6 Clicks" Daily Activity  Outcome  Measure  Difficulty turning over in bed (including adjusting bedclothes, sheets and blankets)?: A Little Difficulty moving from lying on back to sitting on the side of the bed? : A Little Difficulty sitting down on and standing up from a chair with arms (e.g., wheelchair, bedside commode, etc,.)?: A Little Help needed moving to and from a bed to chair (including a wheelchair)?: A Lot Help needed walking in hospital room?: A Lot Help needed climbing 3-5 steps with a railing? : A Lot 6 Click Score: 15    End of Session Equipment Utilized During Treatment: Gait belt Activity Tolerance: Patient tolerated treatment well Patient left: in chair;with call bell/phone within reach;with chair alarm set Nurse Communication: Mobility status PT Visit Diagnosis: Other abnormalities of gait and mobility (R26.89)     Time: 5784-69621148-1207 PT Time Calculation (min) (ACUTE ONLY): 19 min  Charges:  $Gait Training: 8-22 mins                    G Codes:       Maleeyah Mccaughey B. Beverely RisenVan Fleet PT, DPT Acute Rehabilitation  [phone] Pager [phone]    Elon Alaslizabeth B Van Fleet 01/11/2018, 1:32 PM

## 2018-01-12 ENCOUNTER — Inpatient Hospital Stay (HOSPITAL_COMMUNITY): Payer: Medicaid Other

## 2018-01-12 LAB — BASIC METABOLIC PANEL
ANION GAP: 12 (ref 5–15)
BUN: 9 mg/dL (ref 6–20)
CALCIUM: 9.1 mg/dL (ref 8.9–10.3)
CO2: 23 mmol/L (ref 22–32)
Chloride: 98 mmol/L — ABNORMAL LOW (ref 101–111)
Creatinine, Ser: 0.8 mg/dL (ref 0.61–1.24)
Glucose, Bld: 93 mg/dL (ref 65–99)
Potassium: 4.5 mmol/L (ref 3.5–5.1)
SODIUM: 133 mmol/L — AB (ref 135–145)

## 2018-01-12 LAB — GLUCOSE, CAPILLARY
GLUCOSE-CAPILLARY: 86 mg/dL (ref 65–99)
GLUCOSE-CAPILLARY: 97 mg/dL (ref 65–99)

## 2018-01-12 LAB — MAGNESIUM: Magnesium: 1.9 mg/dL (ref 1.7–2.4)

## 2018-01-12 MED ORDER — MAGNESIUM SULFATE IN D5W 1-5 GM/100ML-% IV SOLN
1.0000 g | Freq: Once | INTRAVENOUS | Status: AC
  Administered 2018-01-12: 1 g via INTRAVENOUS
  Filled 2018-01-12: qty 100

## 2018-01-12 MED ORDER — TRAVASOL 10 % IV SOLN
INTRAVENOUS | Status: AC
  Administered 2018-01-12: 18:00:00 via INTRAVENOUS
  Filled 2018-01-12: qty 1094.4

## 2018-01-12 NOTE — Progress Notes (Signed)
[facility] Progress Note 11 Days Post-Op  Subjective: Patient okay. n No NGT in place  Objective: Vital signs in last 24 hours: Temp:  [98.1 F (36.7 C)-99.1 F (37.3 C)] 98.1 F (36.7 C) (01/19 1019) Pulse Rate:  [79-86] 86 (01/19 1019) Resp:  [18-20] 18 (01/19 1019) BP: (100-114)/(60-75) 112/64 (01/19 1019) SpO2:  [96 %-100 %] 97 % (01/19 1019) Last BM Date: 01/11/18  Intake/Output from previous day: 01/18 0701 - 01/19 0700 In: -  Out: 7200 [Urine:6800; Stool:400] Intake/Output this shift: No intake/output data recorded.  General: NO distress  Lungs: Clear  Abd: Active bowel sounds.  KUB still shows a significant amount of distended SB.  Contrast is in colon.  This seems to be more of a resolving ileus as opposed to SBO  Extremities: No changes  Neuro: Intact  Lab Results:  @LABLAST2 (wbc:2,hgb:2,hct:2,plt:2) BMET ) Recent Labs    01/11/18 0416 01/12/18 0330  NA 137 133*  K 4.1 4.5  CL 103 98*  CO2 24 23  GLUCOSE 96 93  BUN <5* 9  CREATININE 0.90 0.80  CALCIUM 8.9 9.1   PT/INR No results for input(s): LABPROT, INR in the last 72 hours. ABG No results for input(s): PHART, HCO3 in the last 72 hours.  Invalid input(s): PCO2, PO2  Studies/Results: Dg Abd Portable 1v  Result Date: 01/12/2018 CLINICAL DATA:  27 year old male with abdominal pain. Recent VP shunt infection status post shunt removal. Pelvic abscess on recent CT EXAM: PORTABLE ABDOMEN - 1 VIEW COMPARISON:  CT Abdomen and Pelvis 01/09/2018 and earlier. FINDINGS: Dilated gas-filled small bowel loops persist up to 54 millimeters diameter today. But normal large bowel gas pattern with residual oral contrast throughout the colon from the hepatic flexure to the rectum. No definite pneumoperitoneum, these may be supine images. Enteric tube has been pulled back or removed and tubing is partially visible over the lower mediastinum at the level of the distal esophagus (arrow). Otherwise negative lung bases.  No acute osseous abnormality identified. IMPRESSION: 1. Continued small bowel obstruction, which appears to be high-grade partial obstruction. No improvement from the recent CT but oral contrast has reached the large bowel. 2. Tube tip projecting over the lower mediastinum may be the NG tube which has been pulled back. This should be advanced at least 9 centimeters to allow placement within the stomach. Electronically Signed   By: Odessa FlemingH  Hall M.D.   On: 01/12/2018 08:31    Anti-infectives: Anti-infectives (From admission, onward)   Start     Dose/Rate Route Frequency Ordered Stop   01/11/18 1600  cefTRIAXone (ROCEPHIN) 2 g in dextrose 5 % 50 mL IVPB     2 g 100 mL/hr over 30 Minutes Intravenous Every 24 hours 01/11/18 1435     01/11/18 1500  metroNIDAZOLE (FLAGYL) IVPB 500 mg     500 mg 100 mL/hr over 60 Minutes Intravenous Every 8 hours 01/11/18 1435     01/09/18 1800  Ampicillin-Sulbactam (UNASYN) 3 g in sodium chloride 0.9 % 100 mL IVPB  Status:  Discontinued     3 g 200 mL/hr over 30 Minutes Intravenous Every 6 hours 01/09/18 1730 01/11/18 1435   01/07/18 1314  vancomycin (VANCOCIN) IVPB 750 mg/150 ml premix  Status:  Discontinued     750 mg 150 mL/hr over 60 Minutes Intravenous Every 8 hours 01/07/18 1314 01/10/18 1051   01/05/18 0500  vancomycin (VANCOCIN) IVPB 1000 mg/200 mL premix  Status:  Discontinued     1,000 mg 200 mL/hr over 60  Minutes Intravenous Every 8 hours 01/04/18 1349 01/07/18 1314   01/04/18 1000  fluconazole (DIFLUCAN) tablet 200 mg     200 mg Oral Daily 01/04/18 0844     01/03/18 1000  fluconazole (DIFLUCAN) IVPB 200 mg  Status:  Discontinued     200 mg 100 mL/hr over 60 Minutes Intravenous Every 24 hours 01/02/18 1525 01/04/18 0844   01/02/18 1100  vancomycin (VANCOCIN) 1,250 mg in sodium chloride 0.9 % 250 mL IVPB  Status:  Discontinued     1,250 mg 166.7 mL/hr over 90 Minutes Intravenous Every 8 hours 01/02/18 1025 01/04/18 1137   01/02/18 0900   sulfamethoxazole-trimethoprim (BACTRIM DS,SEPTRA DS) 800-160 MG per tablet 1 tablet     1 tablet Oral Once per day on Mon Wed Fri 12/31/17 1003     01/01/18 0930  fluconazole (DIFLUCAN) IVPB 400 mg  Status:  Discontinued     400 mg 100 mL/hr over 120 Minutes Intravenous Every 24 hours 01/01/18 0830 01/02/18 1525   12/31/17 2200  vancomycin (VANCOCIN) IVPB 1000 mg/200 mL premix  Status:  Discontinued     1,000 mg 200 mL/hr over 60 Minutes Intravenous Every 8 hours 12/31/17 2048 01/02/18 1025   12/31/17 1400  cefTAZidime (FORTAZ) 2 g in dextrose 5 % 50 mL IVPB  Status:  Discontinued     2 g 100 mL/hr over 30 Minutes Intravenous Every 8 hours 12/31/17 1119 01/01/18 0808   12/31/17 1200  dolutegravir (TIVICAY) tablet 50 mg     50 mg Oral Daily 12/31/17 0207     12/31/17 1200  emtricitabine-rilpivir-tenofovir AF (ODEFSEY) 200-25-25 MG per tablet 1 tablet     1 tablet Oral Daily with breakfast 12/31/17 0207     12/31/17 1100  fluconazole (DIFLUCAN) tablet 400 mg  Status:  Discontinued     400 mg Oral Daily 12/31/17 1003 01/01/18 0829   12/31/17 1000  vancomycin (VANCOCIN) IVPB 1000 mg/200 mL premix  Status:  Discontinued     1,000 mg 200 mL/hr over 60 Minutes Intravenous 2 times daily 12/31/17 0444 12/31/17 2048   12/31/17 0215  sulfamethoxazole-trimethoprim (BACTRIM DS,SEPTRA DS) 800-160 MG per tablet 1 tablet  Status:  Discontinued     1 tablet Oral 2 times daily 12/31/17 0207 12/31/17 1003   12/31/17 0215  cefTRIAXone (ROCEPHIN) 2 g in dextrose 5 % 50 mL IVPB  Status:  Discontinued     2 g 100 mL/hr over 30 Minutes Intravenous Every 24 hours 12/31/17 0207 12/31/17 1119   12/30/17 2345  vancomycin (VANCOCIN) 1,500 mg in sodium chloride 0.9 % 500 mL IVPB     1,500 mg 250 mL/hr over 120 Minutes Intravenous  Once 12/30/17 2331 12/31/17 0324   12/30/17 2330  cefTRIAXone (ROCEPHIN) 2 g in dextrose 5 % 50 mL IVPB     2 g 100 mL/hr over 30 Minutes Intravenous  Once 12/30/17 2328 12/31/17 0122       Assessment/Plan: s/p Procedure(s): REMOVAL OF V-P SHUNT, PLACEMENT OF EVD Advance diet Ice dhips only.  LOS: 12 days   Marta Lamas. Gae Bon, MD, FACS [phone] [name] 01/12/2018

## 2018-01-12 NOTE — Progress Notes (Signed)
PHARMACY - ADULT TOTAL PARENTERAL NUTRITION CONSULT NOTE   Pharmacy Consult:  TPN Indication:  Ileus  Patient Measurements: Height: 6\' 1"  (185.4 cm) Weight: 173 lb 11.6 oz (78.8 kg) IBW/kg (Calculated) : 79.9 TPN AdjBW (KG): 78.8 Body mass index is 22.92 kg/m.  Assessment:  [age] YOM with history of neurogenic bowel presented on 12/31/17 with fever and rash.  He was also having nausea and vomiting with hypoactive bowel sounds and distended abdomen on 01/01/18.  Diagnosed with ileus secondary to infected VP shunt.  Patient tolerated intermittent clamping of NG tube and consumed some clear liquids.  On 01/08/18, he vomited a large amount of green/clear bile.  Pharmacy consulted to manage TPN.  Patient reports no recent weight loss/gain and he was eating regularly PTA.  GI: baseline prealbumin low at 8, NGT came out 1/18, stool O/P down to 400mL. 1/16 CT showed SBO - Reglan IV.   Endo: no hx DM - CBGs low normal Insulin requirements in the past 24 hours: 0 unit SSI  Lytes: ?mild refeeding initially -Na/Cl down to 133/98,  K+ up 4.5 (goal >/= 4 for ileus; rising in setting of Bactrim), Mag down 1.9 (goal >/= 2 for ileus) Renal: neurogenic bladder - SCr 0.80 stable, BUN 9.  UOP 2.4 ml/kg/hr, NS 20K at 10 ml/hr Pulm: stable on RA Cards: VSS Hepatobil: Hep B - LFTs WNL, tbili mildly elevated at 1.4 on 1/17.  TG WNL. 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  Changed Unasyn to Ceftriaxone and Flagyl for cellulitis/abdominal abscess.  Afebrile, WBC up on 1/17 when antibiotic change was made. 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  TPN will provide 110g AA, 326g CHO and 48g ILE, which equals to 2027 kCal, meeting ~100% of protein needs and 91% of kcal needs.  Electrolytes in TPN: reducing K, increasing Na, increasing Mg, no change to Phos, Cl:Ac 1:2 Daily multivitamin and trace elements in TPN Reduce NS 20K to 10 ml/hr  Magnesium 1g IV x1 today. Continue sensitive SSI Q6H.  D/C if CBGs remain controlled at goal TPN rate. F/U AM labs- repeat BMET, Mg, and Phos   Link SnufferJessica Jerritt Cardoza, PharmD, BCPS, BCCCP Clinical Pharmacist Clinical phone 01/12/2018 until 3:30PM - [phone] After hours, please call [phone] 01/12/2018, 7:00 AM

## 2018-01-12 NOTE — Progress Notes (Signed)
[facility] PROGRESS NOTE  Justin Ball ZOX:[id number] DOB: 11-08-1991 DOA: 12/30/2017 PCP: No primary care provider on file.  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 Patient was seen by the PCP several days prior to admission with history of rash along his abd wall where his VP shunt was. Patient was initially placed on bactrim. The rash got worse and was tracking along the VP shunt location (under his skin along his chest wall). Patient was admitted with sepsis, and VP shunt infection. Patient has undergone VP shunt removal and placement of EVD (on 01/01/18) by Neurosurgery. Patient also developed ileus secondary to VP shunt infection.    Assessment/Plan:  Sepsis. likely related to shunt infection. Presented with fever, leukocytosis, tachycardia, tachypnea.  Neurosurgery consulted. Patient underwent  VP shunt removal, placement of EVD on 1-08. Blood culture grew Staph Epi and Strept Pneumonia. CSF culture: NGTD. Culture VP shunt from brain grew Haemophilus.  -improved IV Vanco, as well as Medication for HIV and Prophylaxis (Bactrim and fluconazole). Cont antibiotics per ID. Appreciate ID, neurosurgery input  Finished treatment with vancomycin for Staph epi bacteremia.   Ileus. Abdominal Pain; Nausea, vomiting. Multiples intra-abdominal abscess.   General surgery following.  CT abdomen with intra-abdominal abscess.  recommendations.  IR consulted didn't recommended drainage placement.  -Continue with TPN.  -NGT came out. Sx recommend sips of clears.  -started on Unasyn 1-16 change to ceftriaxone and flagyl 1-18 -WBC at 20.  Labs pending for the morning.   AIDS: Continue with [place], Odefsey. ID following. On Fluconazole for history of cryptococcus Meningitis.   Seizure: Continue with Keppra.   Abnormal electrolytes. Cont replacing , monitor labs    S/P VP Shunt. Neurosurgery has been  consulted due to infection. 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 EVD discontinued on 11/15. Cont per neurosurgery.  -CT head stable per neurosurgery   Chronic viral hepatitis. Bipolar; on Seroquel.     Code Status: full Family Communication: d/w patient, Disposition Plan: pend clinical improvement    Consultants:  ID  neurosurg  Procedures:  VP shunt removal   Antibiotics: Anti-infectives (From admission, onward)   Start     Dose/Rate Route Frequency Ordered Stop   01/11/18 1600  cefTRIAXone (ROCEPHIN) 2 g in dextrose 5 % 50 mL IVPB     2 g 100 mL/hr over 30 Minutes Intravenous Every 24 hours 01/11/18 1435     01/11/18 1500  metroNIDAZOLE (FLAGYL) IVPB 500 mg     500 mg 100 mL/hr over 60 Minutes Intravenous Every 8 hours 01/11/18 1435     01/09/18 1800  Ampicillin-Sulbactam (UNASYN) 3 g in sodium chloride 0.9 % 100 mL IVPB  Status:  Discontinued     3 g 200 mL/hr over 30 Minutes Intravenous Every 6 hours 01/09/18 1730 01/11/18 1435   01/07/18 1314  vancomycin (VANCOCIN) IVPB 750 mg/150 ml premix  Status:  Discontinued     750 mg 150 mL/hr over 60 Minutes Intravenous Every 8 hours 01/07/18 1314 01/10/18 1051   01/05/18 0500  vancomycin (VANCOCIN) IVPB 1000 mg/200 mL premix  Status:  Discontinued     1,000 mg 200 mL/hr over 60 Minutes Intravenous Every 8 hours 01/04/18 1349 01/07/18 1314   01/04/18 1000  fluconazole (DIFLUCAN) tablet 200 mg     200 mg Oral Daily 01/04/18 0844     01/03/18 1000  fluconazole (  DIFLUCAN) IVPB 200 mg  Status:  Discontinued     200 mg 100 mL/hr over 60 Minutes Intravenous Every 24 hours 01/02/18 1525 01/04/18 0844   01/02/18 1100  vancomycin (VANCOCIN) 1,250 mg in sodium chloride 0.9 % 250 mL IVPB  Status:  Discontinued     1,250 mg 166.7 mL/hr over 90 Minutes Intravenous Every 8 hours 01/02/18 1025 01/04/18 1137   01/02/18 0900  sulfamethoxazole-trimethoprim (BACTRIM  DS,SEPTRA DS) 800-160 MG per tablet 1 tablet     1 tablet Oral Once per day on Mon Wed Fri 12/31/17 1003     01/01/18 0930  fluconazole (DIFLUCAN) IVPB 400 mg  Status:  Discontinued     400 mg 100 mL/hr over 120 Minutes Intravenous Every 24 hours 01/01/18 0830 01/02/18 1525   12/31/17 2200  vancomycin (VANCOCIN) IVPB 1000 mg/200 mL premix  Status:  Discontinued     1,000 mg 200 mL/hr over 60 Minutes Intravenous Every 8 hours 12/31/17 2048 01/02/18 1025   12/31/17 1400  cefTAZidime (FORTAZ) 2 g in dextrose 5 % 50 mL IVPB  Status:  Discontinued     2 g 100 mL/hr over 30 Minutes Intravenous Every 8 hours 12/31/17 1119 01/01/18 0808   12/31/17 1200  dolutegravir (TIVICAY) tablet 50 mg     50 mg Oral Daily 12/31/17 0207     12/31/17 1200  emtricitabine-rilpivir-tenofovir AF (ODEFSEY) 200-25-25 MG per tablet 1 tablet     1 tablet Oral Daily with breakfast 12/31/17 0207     12/31/17 1100  fluconazole (DIFLUCAN) tablet 400 mg  Status:  Discontinued     400 mg Oral Daily 12/31/17 1003 01/01/18 0829   12/31/17 1000  vancomycin (VANCOCIN) IVPB 1000 mg/200 mL premix  Status:  Discontinued     1,000 mg 200 mL/hr over 60 Minutes Intravenous 2 times daily 12/31/17 0444 12/31/17 2048   12/31/17 0215  sulfamethoxazole-trimethoprim (BACTRIM DS,SEPTRA DS) 800-160 MG per tablet 1 tablet  Status:  Discontinued     1 tablet Oral 2 times daily 12/31/17 0207 12/31/17 1003   12/31/17 0215  cefTRIAXone (ROCEPHIN) 2 g in dextrose 5 % 50 mL IVPB  Status:  Discontinued     2 g 100 mL/hr over 30 Minutes Intravenous Every 24 hours 12/31/17 0207 12/31/17 1119   12/30/17 2345  vancomycin (VANCOCIN) 1,500 mg in sodium chloride 0.9 % 500 mL IVPB     1,500 mg 250 mL/hr over 120 Minutes Intravenous  Once 12/30/17 2331 12/31/17 0324   12/30/17 2330  cefTRIAXone (ROCEPHIN) 2 g in dextrose 5 % 50 mL IVPB     2 g 100 mL/hr over 30 Minutes Intravenous  Once 12/30/17 2328 12/31/17 0122        HPI/Subjective: Denies  abdominal pain, no nausea or vomiting.  NG tube removed.  Objective: Vitals:   01/12/18 0600 01/12/18 1019  BP: 114/60 112/64  Pulse: 81 86  Resp: 18 18  Temp: 98.7 F (37.1 C) 98.1 F (36.7 C)  SpO2: 96% 97%    Intake/Output Summary (Last 24 hours) at 01/12/2018 1410 Last data filed at 01/12/2018 1300 Gross per 24 hour  Intake -  Output 8400 ml  Net -8400 ml   Filed Weights   12/30/17 1808 12/31/17 0758  Weight: 81.6 kg (180 lb) 78.8 kg (173 lb 11.6 oz)    Exam:   General: NAD  Cardiovascular: S 1, S 2 RRR  Respiratory: CTA  Abdomen: BS decreased, nt  Musculoskeletal: no edema  Data Reviewed: Basic  Metabolic Panel: Recent Labs  Lab 01/06/18 0338 01/06/18 2244 01/08/18 0423 01/09/18 0712 01/10/18 1056 01/11/18 0416 01/12/18 0330  NA 138 136 136 136 136 137 133*  K 2.9* 3.2* 3.5 3.3* 3.7 4.1 4.5  CL 105 105 104 104 104 103 98*  CO2 24 22 21* 24 23 24 23   GLUCOSE 89 83 87 119* 93 96 93  BUN <5* <5* <5* <5* <5* <5* 9  CREATININE 0.70 0.84 0.86 0.85 0.48* 0.90 0.80  CALCIUM 8.1* 8.2* 8.4* 8.4* 8.6* 8.9 9.1  MG 2.0  --   --  1.6* 1.8 2.1 1.9  PHOS 2.7 2.8  --  3.1 3.7 4.2  --    Liver Function Tests: Recent Labs  Lab 01/06/18 0338 01/06/18 2244 01/09/18 0712 01/10/18 1056  AST  --   --  31 32  ALT  --   --  18 15*  ALKPHOS  --   --  69 74  BILITOT  --   --  1.7* 1.4*  PROT  --   --  7.0 7.4  ALBUMIN 2.1* 2.2* 2.3* 2.6*   No results for input(s): LIPASE, AMYLASE in the last 168 hours. No results for input(s): AMMONIA in the last 168 hours. CBC: Recent Labs  Lab 01/06/18 0338 01/08/18 0423 01/09/18 0712 01/10/18 1043 01/11/18 0416  WBC 24.9* 17.8* 17.8* 22.8* 21.5*  NEUTROABS 22.4*  --  14.3*  --   --   HGB 10.3* 11.1* 10.3* 10.3* 10.7*  HCT 31.7* 33.9* 31.7* 31.7* 32.4*  MCV 95.5 95.2 95.8 96.9 97.3  PLT 432* 468* 451* 424* 423*   Cardiac Enzymes: No results for input(s): CKTOTAL, CKMB, CKMBINDEX, TROPONINI in the last 168  hours. BNP (last 3 results) No results for input(s): BNP in the last 8760 hours.  ProBNP (last 3 results) No results for input(s): PROBNP in the last 8760 hours.  CBG: Recent Labs  Lab 01/11/18 0606 01/11/18 1140 01/11/18 1757 01/12/18 0007 01/12/18 0603  GLUCAP 111* 103* 83 86 97    Recent Results (from the past 240 hour(s))  C difficile quick scan w PCR reflex     Status: None   Collection Time: 01/09/18  9:36 AM  Result Value Ref Range Status   C Diff antigen NEGATIVE NEGATIVE Final   C Diff toxin NEGATIVE NEGATIVE Final   C Diff interpretation No C. difficile detected.  Final     Studies: Dg Abd Portable 1v  Result Date: 01/12/2018 CLINICAL DATA:  27 year old male with abdominal pain. Recent VP shunt infection status post shunt removal. Pelvic abscess on recent CT EXAM: PORTABLE ABDOMEN - 1 VIEW COMPARISON:  CT Abdomen and Pelvis 01/09/2018 and earlier. FINDINGS: Dilated gas-filled small bowel loops persist up to 54 millimeters diameter today. But normal large bowel gas pattern with residual oral contrast throughout the colon from the hepatic flexure to the rectum. No definite pneumoperitoneum, these may be supine images. Enteric tube has been pulled back or removed and tubing is partially visible over the lower mediastinum at the level of the distal esophagus (arrow). Otherwise negative lung bases. No acute osseous abnormality identified. IMPRESSION: 1. Continued small bowel obstruction, which appears to be high-grade partial obstruction. No improvement from the recent CT but oral contrast has reached the large bowel. 2. Tube tip projecting over the lower mediastinum may be the NG tube which has been pulled back. This should be advanced at least 9 centimeters to allow placement within the stomach. Electronically Signed   By:  Odessa Fleming M.D.   On: 01/12/2018 08:31    Scheduled Meds: . chlorhexidine  15 mL Mouth Rinse BID  . Chlorhexidine Gluconate Cloth  6 each Topical Daily  .  dolutegravir  50 mg Oral Daily  . emtricitabine-rilpivir-tenofovir AF  1 tablet Oral Q breakfast  . fluconazole  200 mg Oral Daily  . insulin aspart  0-9 Units Subcutaneous Q6H  . mouth rinse  15 mL Mouth Rinse q12n4p  . metoCLOPramide (REGLAN) injection  10 mg Intravenous Q6H  . sodium chloride flush  10-40 mL Intracatheter Q12H  . sulfamethoxazole-trimethoprim  1 tablet Oral Once per day on Mon Wed Fri   Continuous Infusions: . 0.9 % NaCl with KCl 20 mEq / L 10 mL/hr at 01/11/18 1723  . cefTRIAXone (ROCEPHIN)  IV Stopped (01/11/18 1630)  . levETIRAcetam Stopped (01/12/18 0939)  . metronidazole 500 mg (01/12/18 1400)  . TPN ADULT (ION) 80 mL/hr at 01/11/18 1652  . 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 If 7PM-7AM, please contact night-coverage at [email or web address], password [facility] 01/12/2018, 2:10 PM  LOS: 12 days

## 2018-01-13 LAB — CBC
HEMATOCRIT: 33.3 % — AB (ref 39.0–52.0)
HEMOGLOBIN: 10.8 g/dL — AB (ref 13.0–17.0)
MCH: 31.3 pg (ref 26.0–34.0)
MCHC: 32.4 g/dL (ref 30.0–36.0)
MCV: 96.5 fL (ref 78.0–100.0)
Platelets: 427 10*3/uL — ABNORMAL HIGH (ref 150–400)
RBC: 3.45 MIL/uL — ABNORMAL LOW (ref 4.22–5.81)
RDW: 14.3 % (ref 11.5–15.5)
WBC: 17.3 10*3/uL — AB (ref 4.0–10.5)

## 2018-01-13 LAB — PHOSPHORUS: PHOSPHORUS: 4.7 mg/dL — AB (ref 2.5–4.6)

## 2018-01-13 LAB — BASIC METABOLIC PANEL
ANION GAP: 10 (ref 5–15)
BUN: 13 mg/dL (ref 6–20)
CHLORIDE: 100 mmol/L — AB (ref 101–111)
CO2: 23 mmol/L (ref 22–32)
Calcium: 9.3 mg/dL (ref 8.9–10.3)
Creatinine, Ser: 0.94 mg/dL (ref 0.61–1.24)
GFR calc non Af Amer: >60 mL/min (ref >60)
Glucose, Bld: 105 mg/dL — ABNORMAL HIGH (ref 65–99)
Potassium: 4.4 mmol/L (ref 3.5–5.1)
SODIUM: 133 mmol/L — AB (ref 135–145)

## 2018-01-13 LAB — GLUCOSE, CAPILLARY
GLUCOSE-CAPILLARY: 103 mg/dL — AB (ref 65–99)
GLUCOSE-CAPILLARY: 105 mg/dL — AB (ref 65–99)
GLUCOSE-CAPILLARY: 110 mg/dL — AB (ref 65–99)
Glucose-Capillary: 113 mg/dL — ABNORMAL HIGH (ref 65–99)
Glucose-Capillary: 112 mg/dL — ABNORMAL HIGH (ref 65–99)
Glucose-Capillary: 108 mg/dL — ABNORMAL HIGH (ref 65–99)

## 2018-01-13 LAB — MAGNESIUM: MAGNESIUM: 2.1 mg/dL (ref 1.7–2.4)

## 2018-01-13 MED ORDER — TRAVASOL 10 % IV SOLN
INTRAVENOUS | Status: AC
  Administered 2018-01-13: 17:00:00 via INTRAVENOUS
  Filled 2018-01-13: qty 1094.4

## 2018-01-13 NOTE — Progress Notes (Signed)
PHARMACY - ADULT TOTAL PARENTERAL NUTRITION CONSULT NOTE   Pharmacy Consult:  TPN Indication:  Ileus  Patient Measurements: Height: 6\' 1"  (185.4 cm) Weight: 173 lb 11.6 oz (78.8 kg) IBW/kg (Calculated) : 79.9 TPN AdjBW (KG): 78.8 Body mass index is 22.92 kg/m.  Assessment:  [age] YOM with history of neurogenic bowel presented on 12/31/17 with fever and rash.  He was also having nausea and vomiting with hypoactive bowel sounds and distended abdomen on 01/01/18.  Diagnosed with ileus secondary to infected VP shunt.  Patient tolerated intermittent clamping of NG tube and consumed some clear liquids.  On 01/08/18, he vomited a large amount of green/clear bile.  Pharmacy consulted to manage TPN.  Patient reports no recent weight loss/gain and he was eating regularly PTA.  GI: baseline prealbumin low at 8, NGT came out 1/18, stool O/P down to 0mL. 1/16 CT showed SBO - Reglan IV.   Endo: no hx DM - CBGs low normal Insulin requirements in the past 24 hours: 0 unit  Lytes: ?mild refeeding initially -Na/Cl down to 133/100,  K+ 4.4 (goal >/= 4 for ileus; on Bactrim), Mag 2.1 (goal >/= 2 for ileus), Phos elevated at 4.7 Renal: neurogenic bladder - SCr 0.94 stable, BUN 13.  UOP 1.2 ml/kg/hr, NS 20K at 10 ml/hr; Net -12.5L Pulm: stable on RA Cards: VSS Hepatobil: Hep B - LFTs WNL, tbili mildly elevated at 1.4 on 1/17.  TG WNL. 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  Changed Unasyn to Ceftriaxone and Flagyl for cellulitis/abdominal abscess.  Afebrile, WBC trending down. 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  TPN will provide 110g AA, 326g CHO and 48g ILE, which equals to 2027 kCal, meeting ~100% of protein needs and 91% of kcal needs.  Electrolytes in TPN: reduce Phos; reduced K 1/19, increasing Na and Mg 1/19, Cl:Ac 1:2 (watch and consider change to 1:1) Daily multivitamin and trace elements in TPN Continue sensitive SSI Q6H.  D/C if CBGs remain controlled at goal TPN rate. F/U AM labs   Link SnufferJessica Delynn Pursley, PharmD, BCPS, BCCCP Clinical Pharmacist Clinical phone 01/13/2018 until 3:30PM [phone]- [phone] After hours, please call [phone] 01/13/2018, 6:56 AM

## 2018-01-13 NOTE — Plan of Care (Signed)
No c/o pain after receiving Seroquel. Stables on head was itching and irritating pt. Educated on not putting his hands on the stable to prevent an infection.

## 2018-01-13 NOTE — Progress Notes (Signed)
[facility] PROGRESS NOTE  ASSAD HARBESON ZOX:[id number] DOB: 1991-12-02 DOA: 12/30/2017 PCP: No primary care provider on file.  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 Patient was seen by the PCP several days prior to admission with history of rash along his abd wall where his VP shunt was. Patient was initially placed on bactrim. The rash got worse and was tracking along the VP shunt location (under his skin along his chest wall). Patient was admitted with sepsis, and VP shunt infection. Patient has undergone VP shunt removal and placement of EVD (on 01/01/18) by Neurosurgery. Patient also developed ileus secondary to VP shunt infection.    Assessment/Plan:  Sepsis. likely related to shunt infection. Presented with fever, leukocytosis, tachycardia, tachypnea.  Neurosurgery consulted. Patient underwent  VP shunt removal, placement of EVD on 1-08. Blood culture grew Staph Epi and Strept Pneumonia. CSF culture: NGTD. Culture VP shunt from brain grew Haemophilus.  -improved IV Vanco, as well as Medication for HIV and Prophylaxis (Bactrim and fluconazole). Cont antibiotics per ID. Appreciate ID, neurosurgery input  Finished treatment with vancomycin for Staph epi bacteremia.   Ileus. Abdominal Pain; Nausea, vomiting. Multiples intra-abdominal abscess.  General surgery following.  CT abdomen with intra-abdominal abscess.  recommendations.  IR consulted didn't recommended drainage placement.  -Continue with TPN.  -NGT came out. Sx recommend sips of clears.  -started on Unasyn 1-16 change to ceftriaxone and flagyl 1-18 -WBC trending down.  -  AIDS: Continue with Tivicay, Odefsey. ID following. On Fluconazole for history of cryptococcus Meningitis.   Seizure: Continue with Keppra.   Abnormal electrolytes. Cont replacing , monitor labs    S/P VP Shunt. Neurosurgery has been consulted due to  infection. 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 EVD discontinued on 11/15. Cont per neurosurgery.  -CT head stable per neurosurgery   Chronic viral hepatitis. Bipolar; on Seroquel.     Code Status: full Family Communication: d/w patient, Disposition Plan: pend clinical improvement    Consultants:  ID  neurosurg  Procedures:  VP shunt removal   Antibiotics: Anti-infectives (From admission, onward)   Start     Dose/Rate Route Frequency Ordered Stop   01/11/18 1600  cefTRIAXone (ROCEPHIN) 2 g in dextrose 5 % 50 mL IVPB     2 g 100 mL/hr over 30 Minutes Intravenous Every 24 hours 01/11/18 1435     01/11/18 1500  metroNIDAZOLE (FLAGYL) IVPB 500 mg     500 mg 100 mL/hr over 60 Minutes Intravenous Every 8 hours 01/11/18 1435     01/09/18 1800  Ampicillin-Sulbactam (UNASYN) 3 g in sodium chloride 0.9 % 100 mL IVPB  Status:  Discontinued     3 g 200 mL/hr over 30 Minutes Intravenous Every 6 hours 01/09/18 1730 01/11/18 1435   01/07/18 1314  vancomycin (VANCOCIN) IVPB 750 mg/150 ml premix  Status:  Discontinued     750 mg 150 mL/hr over 60 Minutes Intravenous Every 8 hours 01/07/18 1314 01/10/18 1051   01/05/18 0500  vancomycin (VANCOCIN) IVPB 1000 mg/200 mL premix  Status:  Discontinued     1,000 mg 200 mL/hr over 60 Minutes Intravenous Every 8 hours 01/04/18 1349 01/07/18 1314   01/04/18 1000  fluconazole (DIFLUCAN) tablet 200 mg     200 mg Oral Daily 01/04/18 0844     01/03/18 1000  fluconazole (DIFLUCAN) IVPB 200 mg  Status:  Discontinued     200 mg 100 mL/hr over 60 Minutes Intravenous Every 24 hours 01/02/18 1525 01/04/18 0844   01/02/18 1100  vancomycin (VANCOCIN) 1,250 mg in sodium chloride 0.9 % 250 mL IVPB  Status:  Discontinued     1,250 mg 166.7 mL/hr over 90 Minutes Intravenous Every 8 hours 01/02/18 1025 01/04/18 1137   01/02/18 0900  sulfamethoxazole-trimethoprim (BACTRIM DS,SEPTRA DS) 800-160 MG  per tablet 1 tablet     1 tablet Oral Once per day on Mon Wed Fri 12/31/17 1003     01/01/18 0930  fluconazole (DIFLUCAN) IVPB 400 mg  Status:  Discontinued     400 mg 100 mL/hr over 120 Minutes Intravenous Every 24 hours 01/01/18 0830 01/02/18 1525   12/31/17 2200  vancomycin (VANCOCIN) IVPB 1000 mg/200 mL premix  Status:  Discontinued     1,000 mg 200 mL/hr over 60 Minutes Intravenous Every 8 hours 12/31/17 2048 01/02/18 1025   12/31/17 1400  cefTAZidime (FORTAZ) 2 g in dextrose 5 % 50 mL IVPB  Status:  Discontinued     2 g 100 mL/hr over 30 Minutes Intravenous Every 8 hours 12/31/17 1119 01/01/18 0808   12/31/17 1200  dolutegravir (TIVICAY) tablet 50 mg     50 mg Oral Daily 12/31/17 0207     12/31/17 1200  emtricitabine-rilpivir-tenofovir AF (ODEFSEY) 200-25-25 MG per tablet 1 tablet     1 tablet Oral Daily with breakfast 12/31/17 0207     12/31/17 1100  fluconazole (DIFLUCAN) tablet 400 mg  Status:  Discontinued     400 mg Oral Daily 12/31/17 1003 01/01/18 0829   12/31/17 1000  vancomycin (VANCOCIN) IVPB 1000 mg/200 mL premix  Status:  Discontinued     1,000 mg 200 mL/hr over 60 Minutes Intravenous 2 times daily 12/31/17 0444 12/31/17 2048   12/31/17 0215  sulfamethoxazole-trimethoprim (BACTRIM DS,SEPTRA DS) 800-160 MG per tablet 1 tablet  Status:  Discontinued     1 tablet Oral 2 times daily 12/31/17 0207 12/31/17 1003   12/31/17 0215  cefTRIAXone (ROCEPHIN) 2 g in dextrose 5 % 50 mL IVPB  Status:  Discontinued     2 g 100 mL/hr over 30 Minutes Intravenous Every 24 hours 12/31/17 0207 12/31/17 1119   12/30/17 2345  vancomycin (VANCOCIN) 1,500 mg in sodium chloride 0.9 % 500 mL IVPB     1,500 mg 250 mL/hr over 120 Minutes Intravenous  Once 12/30/17 2331 12/31/17 0324   12/30/17 2330  cefTRIAXone (ROCEPHIN) 2 g in dextrose 5 % 50 mL IVPB     2 g 100 mL/hr over 30 Minutes Intravenous  Once 12/30/17 2328 12/31/17 0122        HPI/Subjective: He report no vomiting, tolerating sips  of clear.  Still with diarrhea.     Objective: Vitals:   01/13/18 0551 01/13/18 1049  BP: 96/60 98/63  Pulse: 90 79  Resp: 16 (!) 0  Temp: 99 F (37.2 C) 98.6 F (37 C)  SpO2: 97% 98%    Intake/Output Summary (Last 24 hours) at 01/13/2018 1408 Last data filed at 01/13/2018 0711 Gross per 24 hour  Intake 3798.83 ml  Output 2350 ml  Net 1448.83 ml   Filed Weights   12/30/17 1808 12/31/17 0758  Weight: 81.6 kg (180 lb) 78.8 kg (173 lb 11.6 oz)    Exam:   General: NAD  Cardiovascular: S 1, S 2 RRR  Respiratory: CTA  Abdomen: BS present, soft, nt  Musculoskeletal: no edema.   Data Reviewed:  Basic Metabolic Panel: Recent Labs  Lab 01/06/18 2244  01/09/18 0712 01/10/18 1056 01/11/18 0416 01/12/18 0330 01/13/18 0528  NA 136   < > 136 136 137 133* 133*  K 3.2*   < > 3.3* 3.7 4.1 4.5 4.4  CL 105   < > 104 104 103 98* 100*  CO2 22   < > 24 23 24 23 23   GLUCOSE 83   < > 119* 93 96 93 105*  BUN <5*   < > <5* <5* <5* 9 13  CREATININE 0.84   < > 0.85 0.48* 0.90 0.80 0.94  CALCIUM 8.2*   < > 8.4* 8.6* 8.9 9.1 9.3  MG  --   --  1.6* 1.8 2.1 1.9 2.1  PHOS 2.8  --  3.1 3.7 4.2  --  4.7*   < > = values in this interval not displayed.   Liver Function Tests: Recent Labs  Lab 01/06/18 2244 01/09/18 0712 01/10/18 1056  AST  --  31 32  ALT  --  18 15*  ALKPHOS  --  69 74  BILITOT  --  1.7* 1.4*  PROT  --  7.0 7.4  ALBUMIN 2.2* 2.3* 2.6*   No results for input(s): LIPASE, AMYLASE in the last 168 hours. No results for input(s): AMMONIA in the last 168 hours. CBC: Recent Labs  Lab 01/08/18 0423 01/09/18 0712 01/10/18 1043 01/11/18 0416 01/13/18 0528  WBC 17.8* 17.8* 22.8* 21.5* 17.3*  NEUTROABS  --  14.3*  --   --   --   HGB 11.1* 10.3* 10.3* 10.7* 10.8*  HCT 33.9* 31.7* 31.7* 32.4* 33.3*  MCV 95.2 95.8 96.9 97.3 96.5  PLT 468* 451* 424* 423* 427*   Cardiac Enzymes: No results for input(s): CKTOTAL, CKMB, CKMBINDEX, TROPONINI in the last 168  hours. BNP (last 3 results) No results for input(s): BNP in the last 8760 hours.  ProBNP (last 3 results) No results for input(s): PROBNP in the last 8760 hours.  CBG: Recent Labs  Lab 01/11/18 1757 01/12/18 0007 01/12/18 0603 01/13/18 0021 01/13/18 1216  GLUCAP 83 86 97 103* 112*    Recent Results (from the past 240 hour(s))  C difficile quick scan w PCR reflex     Status: None   Collection Time: 01/09/18  9:36 AM  Result Value Ref Range Status   C Diff antigen NEGATIVE NEGATIVE Final   C Diff toxin NEGATIVE NEGATIVE Final   C Diff interpretation No C. difficile detected.  Final     Studies: Dg Abd Portable 1v  Result Date: 01/12/2018 CLINICAL DATA:  27 year old male with abdominal pain. Recent VP shunt infection status post shunt removal. Pelvic abscess on recent CT EXAM: PORTABLE ABDOMEN - 1 VIEW COMPARISON:  CT Abdomen and Pelvis 01/09/2018 and earlier. FINDINGS: Dilated gas-filled small bowel loops persist up to 54 millimeters diameter today. But normal large bowel gas pattern with residual oral contrast throughout the colon from the hepatic flexure to the rectum. No definite pneumoperitoneum, these may be supine images. Enteric tube has been pulled back or removed and tubing is partially visible over the lower mediastinum at the level of the distal esophagus (arrow). Otherwise negative lung bases. No acute osseous abnormality identified. IMPRESSION: 1. Continued small bowel obstruction, which appears to be high-grade partial obstruction. No improvement from the recent CT but oral contrast has reached the large bowel. 2. Tube tip projecting over the lower mediastinum may be the NG tube which has been pulled back.  This should be advanced at least 9 centimeters to allow placement within the stomach. Electronically Signed   By: Odessa Fleming M.D.   On: 01/12/2018 08:31    Scheduled Meds: . chlorhexidine  15 mL Mouth Rinse BID  . Chlorhexidine Gluconate Cloth  6 each Topical Daily  .  dolutegravir  50 mg Oral Daily  . emtricitabine-rilpivir-tenofovir AF  1 tablet Oral Q breakfast  . fluconazole  200 mg Oral Daily  . insulin aspart  0-9 Units Subcutaneous Q6H  . mouth rinse  15 mL Mouth Rinse q12n4p  . metoCLOPramide (REGLAN) injection  10 mg Intravenous Q6H  . sodium chloride flush  10-40 mL Intracatheter Q12H  . sulfamethoxazole-trimethoprim  1 tablet Oral Once per day on Mon Wed Fri   Continuous Infusions: . 0.9 % NaCl with KCl 20 mEq / L 10 mL/hr at 01/11/18 1723  . cefTRIAXone (ROCEPHIN)  IV Stopped (01/12/18 1630)  . levETIRAcetam Stopped (01/13/18 0955)  . metronidazole 500 mg (01/13/18 1329)  . TPN ADULT (ION) 80 mL/hr at 01/12/18 1751  . 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 If 7PM-7AM, please contact night-coverage at [email or web address], password [facility] 01/13/2018, 2:08 PM  LOS: 13 days

## 2018-01-13 NOTE — Progress Notes (Signed)
[facility] Progress Note 12 Days Post-Op  Subjective: Patient okay. Denies complaints. Having stool via rectal tube.  Objective: Vital signs in last 24 hours: Temp:  [97.8 F (36.6 C)-99 F (37.2 C)] 99 F (37.2 C) (01/20 0551) Pulse Rate:  [73-90] 90 (01/20 0551) Resp:  [15-18] 16 (01/20 0551) BP: (96-112)/(60-70) 96/60 (01/20 0551) SpO2:  [97 %-100 %] 97 % (01/20 0551) Last BM Date: 01/11/18  Intake/Output from previous day: 01/19 0701 - 01/20 0700 In: 4198.8 [I.V.:3268.8; IV Piggyback:930] Out: 2300 [Urine:2300] Intake/Output this shift: Total I/O In: -  Out: 1250 [Urine:1250]  General: NO distress  Lungs: Clear  Abd: Active bowel sounds.  KUB still shows a significant amount of distended SB.  Contrast is in colon.  This seems to be more of a resolving ileus as opposed to SBO  Extremities: No changes  Neuro: Intact  Lab Results:   Recent Labs    01/12/18 0330 01/13/18 0528  NA 133* 133*  K 4.5 4.4  CL 98* 100*  CO2 23 23  GLUCOSE 93 105*  BUN 9 13  CREATININE 0.80 0.94  CALCIUM 9.1 9.3    Studies/Results: Dg Abd Portable 1v  Result Date: 01/12/2018 CLINICAL DATA:  27 year old male with abdominal pain. Recent VP shunt infection status post shunt removal. Pelvic abscess on recent CT EXAM: PORTABLE ABDOMEN - 1 VIEW COMPARISON:  CT Abdomen and Pelvis 01/09/2018 and earlier. FINDINGS: Dilated gas-filled small bowel loops persist up to 54 millimeters diameter today. But normal large bowel gas pattern with residual oral contrast throughout the colon from the hepatic flexure to the rectum. No definite pneumoperitoneum, these may be supine images. Enteric tube has been pulled back or removed and tubing is partially visible over the lower mediastinum at the level of the distal esophagus (arrow). Otherwise negative lung bases. No acute osseous abnormality identified. IMPRESSION: 1. Continued small bowel obstruction, which appears to be high-grade partial obstruction. No  improvement from the recent CT but oral contrast has reached the large bowel. 2. Tube tip projecting over the lower mediastinum may be the NG tube which has been pulled back. This should be advanced at least 9 centimeters to allow placement within the stomach. Electronically Signed   By: Odessa Fleming M.D.   On: 01/12/2018 08:31    Anti-infectives: Anti-infectives (From admission, onward)   Start     Dose/Rate Route Frequency Ordered Stop   01/11/18 1600  cefTRIAXone (ROCEPHIN) 2 g in dextrose 5 % 50 mL IVPB     2 g 100 mL/hr over 30 Minutes Intravenous Every 24 hours 01/11/18 1435     01/11/18 1500  metroNIDAZOLE (FLAGYL) IVPB 500 mg     500 mg 100 mL/hr over 60 Minutes Intravenous Every 8 hours 01/11/18 1435     01/09/18 1800  Ampicillin-Sulbactam (UNASYN) 3 g in sodium chloride 0.9 % 100 mL IVPB  Status:  Discontinued     3 g 200 mL/hr over 30 Minutes Intravenous Every 6 hours 01/09/18 1730 01/11/18 1435   01/07/18 1314  vancomycin (VANCOCIN) IVPB 750 mg/150 ml premix  Status:  Discontinued     750 mg 150 mL/hr over 60 Minutes Intravenous Every 8 hours 01/07/18 1314 01/10/18 1051   01/05/18 0500  vancomycin (VANCOCIN) IVPB 1000 mg/200 mL premix  Status:  Discontinued     1,000 mg 200 mL/hr over 60 Minutes Intravenous Every 8 hours 01/04/18 1349 01/07/18 1314   01/04/18 1000  fluconazole (DIFLUCAN) tablet 200 mg     200  mg Oral Daily 01/04/18 0844     01/03/18 1000  fluconazole (DIFLUCAN) IVPB 200 mg  Status:  Discontinued     200 mg 100 mL/hr over 60 Minutes Intravenous Every 24 hours 01/02/18 1525 01/04/18 0844   01/02/18 1100  vancomycin (VANCOCIN) 1,250 mg in sodium chloride 0.9 % 250 mL IVPB  Status:  Discontinued     1,250 mg 166.7 mL/hr over 90 Minutes Intravenous Every 8 hours 01/02/18 1025 01/04/18 1137   01/02/18 0900  sulfamethoxazole-trimethoprim (BACTRIM DS,SEPTRA DS) 800-160 MG per tablet 1 tablet     1 tablet Oral Once per day on Mon Wed Fri 12/31/17 1003     01/01/18 0930   fluconazole (DIFLUCAN) IVPB 400 mg  Status:  Discontinued     400 mg 100 mL/hr over 120 Minutes Intravenous Every 24 hours 01/01/18 0830 01/02/18 1525   12/31/17 2200  vancomycin (VANCOCIN) IVPB 1000 mg/200 mL premix  Status:  Discontinued     1,000 mg 200 mL/hr over 60 Minutes Intravenous Every 8 hours 12/31/17 2048 01/02/18 1025   12/31/17 1400  cefTAZidime (FORTAZ) 2 g in dextrose 5 % 50 mL IVPB  Status:  Discontinued     2 g 100 mL/hr over 30 Minutes Intravenous Every 8 hours 12/31/17 1119 01/01/18 0808   12/31/17 1200  dolutegravir (TIVICAY) tablet 50 mg     50 mg Oral Daily 12/31/17 0207     12/31/17 1200  emtricitabine-rilpivir-tenofovir AF (ODEFSEY) 200-25-25 MG per tablet 1 tablet     1 tablet Oral Daily with breakfast 12/31/17 0207     12/31/17 1100  fluconazole (DIFLUCAN) tablet 400 mg  Status:  Discontinued     400 mg Oral Daily 12/31/17 1003 01/01/18 0829   12/31/17 1000  vancomycin (VANCOCIN) IVPB 1000 mg/200 mL premix  Status:  Discontinued     1,000 mg 200 mL/hr over 60 Minutes Intravenous 2 times daily 12/31/17 0444 12/31/17 2048   12/31/17 0215  sulfamethoxazole-trimethoprim (BACTRIM DS,SEPTRA DS) 800-160 MG per tablet 1 tablet  Status:  Discontinued     1 tablet Oral 2 times daily 12/31/17 0207 12/31/17 1003   12/31/17 0215  cefTRIAXone (ROCEPHIN) 2 g in dextrose 5 % 50 mL IVPB  Status:  Discontinued     2 g 100 mL/hr over 30 Minutes Intravenous Every 24 hours 12/31/17 0207 12/31/17 1119   12/30/17 2345  vancomycin (VANCOCIN) 1,500 mg in sodium chloride 0.9 % 500 mL IVPB     1,500 mg 250 mL/hr over 120 Minutes Intravenous  Once 12/30/17 2331 12/31/17 0324   12/30/17 2330  cefTRIAXone (ROCEPHIN) 2 g in dextrose 5 % 50 mL IVPB     2 g 100 mL/hr over 30 Minutes Intravenous  Once 12/30/17 2328 12/31/17 0122      Assessment/Plan: s/p Procedure(s): REMOVAL OF V-P SHUNT, PLACEMENT OF EVD Advance diet Sips of clear liquids as tolerated  Remainder of care per primary  team   LOS: 13 days   Stephanie Couphristopher M. Cliffton AstersWhite, M.D. [facility]

## 2018-01-14 DIAGNOSIS — B9689 Other specified bacterial agents as the cause of diseases classified elsewhere: Secondary | ICD-10-CM

## 2018-01-14 DIAGNOSIS — Z8661 Personal history of infections of the central nervous system: Secondary | ICD-10-CM

## 2018-01-14 DIAGNOSIS — Z95828 Presence of other vascular implants and grafts: Secondary | ICD-10-CM

## 2018-01-14 LAB — DIFFERENTIAL
BAND NEUTROPHILS: 3 %
BASOS ABS: 0 10*3/uL (ref 0.0–0.1)
Basophils Relative: 0 %
EOS ABS: 0.2 10*3/uL (ref 0.0–0.7)
Eosinophils Relative: 1 %
LYMPHS ABS: 1.7 10*3/uL (ref 0.7–4.0)
Lymphocytes Relative: 11 %
METAMYELOCYTES PCT: 4 %
MONO ABS: 1.1 10*3/uL — AB (ref 0.1–1.0)
MYELOCYTES: 4 %
Monocytes Relative: 7 %
NEUTROS PCT: 70 %
Neutro Abs: 12.6 10*3/uL — ABNORMAL HIGH (ref 1.7–7.7)

## 2018-01-14 LAB — COMPREHENSIVE METABOLIC PANEL
ALT: 27 U/L (ref 17–63)
ANION GAP: 11 (ref 5–15)
AST: 46 U/L — AB (ref 15–41)
Albumin: 3.1 g/dL — ABNORMAL LOW (ref 3.5–5.0)
Alkaline Phosphatase: 144 U/L — ABNORMAL HIGH (ref 38–126)
BUN: 17 mg/dL (ref 6–20)
CHLORIDE: 101 mmol/L (ref 101–111)
CO2: 22 mmol/L (ref 22–32)
Calcium: 9.3 mg/dL (ref 8.9–10.3)
Creatinine, Ser: 0.97 mg/dL (ref 0.61–1.24)
GFR calc Af Amer: >60 mL/min (ref >60)
Glucose, Bld: 105 mg/dL — ABNORMAL HIGH (ref 65–99)
POTASSIUM: 4.3 mmol/L (ref 3.5–5.1)
Sodium: 134 mmol/L — ABNORMAL LOW (ref 135–145)
Total Bilirubin: 1.1 mg/dL (ref 0.3–1.2)
Total Protein: 8.6 g/dL — ABNORMAL HIGH (ref 6.5–8.1)

## 2018-01-14 LAB — CBC
HCT: 31.4 % — ABNORMAL LOW (ref 39.0–52.0)
HEMOGLOBIN: 10.6 g/dL — AB (ref 13.0–17.0)
MCH: 32.6 pg (ref 26.0–34.0)
MCHC: 33.8 g/dL (ref 30.0–36.0)
MCV: 96.6 fL (ref 78.0–100.0)
Platelets: 390 10*3/uL (ref 150–400)
RBC: 3.25 MIL/uL — AB (ref 4.22–5.81)
RDW: 14.6 % (ref 11.5–15.5)
WBC: 15.6 10*3/uL — AB (ref 4.0–10.5)

## 2018-01-14 LAB — GLUCOSE, CAPILLARY
GLUCOSE-CAPILLARY: 76 mg/dL (ref 65–99)
Glucose-Capillary: 106 mg/dL — ABNORMAL HIGH (ref 65–99)
Glucose-Capillary: 110 mg/dL — ABNORMAL HIGH (ref 65–99)

## 2018-01-14 LAB — PHOSPHORUS: PHOSPHORUS: 4.2 mg/dL (ref 2.5–4.6)

## 2018-01-14 LAB — PREALBUMIN: PREALBUMIN: 23.3 mg/dL (ref 18–38)

## 2018-01-14 LAB — TRIGLYCERIDES: Triglycerides: 150 mg/dL — ABNORMAL HIGH (ref <150)

## 2018-01-14 LAB — MAGNESIUM: MAGNESIUM: 2 mg/dL (ref 1.7–2.4)

## 2018-01-14 MED ORDER — TRAVASOL 10 % IV SOLN
INTRAVENOUS | Status: AC
  Administered 2018-01-14: 17:00:00 via INTRAVENOUS
  Filled 2018-01-14: qty 1162.8

## 2018-01-14 MED ORDER — NICOTINE 14 MG/24HR TD PT24
14.0000 mg | MEDICATED_PATCH | Freq: Every day | TRANSDERMAL | Status: DC
  Administered 2018-01-14 – 2018-01-16 (×3): 14 mg via TRANSDERMAL
  Filled 2018-01-14 (×4): qty 1

## 2018-01-14 NOTE — Progress Notes (Signed)
[facility]  Date of Admission:  12/30/2017     Total days of antibiotics 13   Day 4 Ceftriaxone    Day 4 Flagyl  Reason for Visit: Follow up on abdominal abscess          Patient ID: Justin Ball is a 27 y.o. 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 chlorhexidine  15 mL Mouth Rinse BID  . Chlorhexidine Gluconate Cloth  6 each Topical Daily  . dolutegravir  50 mg Oral Daily  . emtricitabine-rilpivir-tenofovir AF  1 tablet Oral Q breakfast  . fluconazole  200 mg Oral Daily  . mouth rinse  15 mL Mouth Rinse q12n4p  . metoCLOPramide (REGLAN) injection  10 mg Intravenous Q6H  . sodium chloride flush  10-40 mL Intracatheter Q12H  . sulfamethoxazole-trimethoprim  1 tablet Oral Once per day on Mon Wed Fri   Allergies  Allergen Reactions  . Acyclovir And Related     Interval History:  No new complaints today. WBC continues to trend down after changing from amp-sulbactam to Ceftriaxone + Flagyl 21.5 >> 17.3 >> 15.6 today. Still receiving TPN however surgery is going to try to advance his diet to clears.   OBJECTIVE: Vitals:   01/13/18 1817 01/13/18 2121 01/14/18 0131 01/14/18 0511  BP: 100/66 101/62 97/62 (!) 92/59  Pulse:  86 83 82  Resp:  18 18 18   Temp:  99.1 F (37.3 C) 97.6 F (36.4 C) 97.6 F (36.4 C)  TempSrc:  Oral Oral Oral  SpO2: 98% 100% 99% 98%  Weight:      Height:       Body mass index is 22.92 kg/m.  Physical Exam  Constitutional: He is oriented to person, place, and time and well-developed, well-nourished, and in no distress.  Lying in bed in no  distress. Appears tired. Answers questions appropriately.   HENT:  Mouth/Throat: Oropharynx is clear and moist. No oral lesions. Normal dentition. No dental caries.  Staples noted to right temporal region clean and dry.   Eyes: EOM are normal. Pupils are equal, round, and reactive to light. No scleral icterus.  Neck: Normal range of motion.  Cardiovascular: Normal rate, regular rhythm and normal heart sounds.  Pulmonary/Chest: Effort normal and breath sounds normal.  Abdominal: Soft. He exhibits no distension. Bowel sounds are hypoactive. There is no tenderness. There is no rigidity.  Genitourinary:  Genitourinary Comments: Suprapubic catheter in place. Clean.   Lymphadenopathy:    He has no cervical adenopathy.  Neurological: He is alert and oriented to person, place, and time.  Skin: Skin is warm and dry. No rash noted.  Psychiatric: Mood and affect normal.  Vitals reviewed. RUE PICC site clean and dry.   Lab Results Lab Results  Component Value Date   WBC 15.6 (H) 01/14/2018   HGB 10.6 (L) 01/14/2018   HCT 31.4 (L) 01/14/2018   MCV 96.6 01/14/2018   PLT 390 01/14/2018    Lab Results  Component Value Date   CREATININE 0.97 01/14/2018   BUN 17 01/14/2018  NA 134 (L) 01/14/2018   K 4.3 01/14/2018   CL 101 01/14/2018   CO2 22 01/14/2018    Lab Results  Component Value Date   ALT 27 01/14/2018   AST 46 (H) 01/14/2018   ALKPHOS 144 (H) 01/14/2018   BILITOT 1.1 01/14/2018     Microbiology: Recent Results (from the past 240 hour(s))  C difficile quick scan w PCR reflex     Status: None   Collection Time: 01/09/18  9:36 AM  Result Value Ref Range Status   C Diff antigen NEGATIVE NEGATIVE Final   C Diff toxin NEGATIVE NEGATIVE Final   C Diff interpretation No C. difficile detected.  Final   ASSESSMENT: 1. Abdominal abscesses 1/16 scan 2/2 infected VP shunt. (Removed 01/01/18) 2. Coagulase negative staph bacteremia -- treatment completed vanc x 10d 3. VP shunt  infection - haemophilus influenzae  4. HIV - recent CD4 400 after several months of viral suppression 5. H/O Cryptococcal Meningitis   PLAN: 1. Continue Ceftiraxone and Flagyl for intraabdominal abscess.  2. Continue Tivicay + Odefsey for HIV infection which is very well controlled.  3. Continue Fluconazole for secondary prophylaxis as his CD4 only recently over 200.   Rexene Alberts, MSN, NP-C [facility] Pager: [phone]  01/14/2018  9:20 AM

## 2018-01-14 NOTE — Progress Notes (Signed)
13 Days Post-Op   Subjective/Chief Complaint: Pt with no c/o this AM Denies stool, but recorded in chart   Objective: Vital signs in last 24 hours: Temp:  [97.6 F (36.4 C)-99.1 F (37.3 C)] 97.6 F (36.4 C) (01/21 0511) Pulse Rate:  [69-86] 82 (01/21 0511) Resp:  [18-20] 18 (01/21 0511) BP: (92-101)/(58-66) 92/59 (01/21 0511) SpO2:  [98 %-100 %] 98 % (01/21 0511) Last BM Date: 01/13/18  Intake/Output from previous day: 01/20 0701 - 01/21 0700 In: 1526 [P.O.:60; I.V.:1256; IV Piggyback:210] Out: 2150 [Urine:2100; Stool:50] Intake/Output this shift: No intake/output data recorded.  General appearance: alert and cooperative GI: distended, soft, nttp, min BS  Lab Results:  Recent Labs    01/13/18 0528 01/14/18 0406  WBC 17.3* 15.6*  HGB 10.8* 10.6*  HCT 33.3* 31.4*  PLT 427* 390   BMET Recent Labs    01/13/18 0528 01/14/18 0406  NA 133* 134*  K 4.4 4.3  CL 100* 101  CO2 23 22  GLUCOSE 105* 105*  BUN 13 17  CREATININE 0.94 0.97  CALCIUM 9.3 9.3   PT/INR No results for input(s): LABPROT, INR in the last 72 hours. ABG No results for input(s): PHART, HCO3 in the last 72 hours.  Invalid input(s): PCO2, PO2  Studies/Results: No results found.  Anti-infectives: Anti-infectives (From admission, onward)   Start     Dose/Rate Route Frequency Ordered Stop   01/11/18 1600  cefTRIAXone (ROCEPHIN) 2 g in dextrose 5 % 50 mL IVPB     2 g 100 mL/hr over 30 Minutes Intravenous Every 24 hours 01/11/18 1435     01/11/18 1500  metroNIDAZOLE (FLAGYL) IVPB 500 mg     500 mg 100 mL/hr over 60 Minutes Intravenous Every 8 hours 01/11/18 1435     01/09/18 1800  Ampicillin-Sulbactam (UNASYN) 3 g in sodium chloride 0.9 % 100 mL IVPB  Status:  Discontinued     3 g 200 mL/hr over 30 Minutes Intravenous Every 6 hours 01/09/18 1730 01/11/18 1435   01/07/18 1314  vancomycin (VANCOCIN) IVPB 750 mg/150 ml premix  Status:  Discontinued     750 mg 150 mL/hr over 60 Minutes  Intravenous Every 8 hours 01/07/18 1314 01/10/18 1051   01/05/18 0500  vancomycin (VANCOCIN) IVPB 1000 mg/200 mL premix  Status:  Discontinued     1,000 mg 200 mL/hr over 60 Minutes Intravenous Every 8 hours 01/04/18 1349 01/07/18 1314   01/04/18 1000  fluconazole (DIFLUCAN) tablet 200 mg     200 mg Oral Daily 01/04/18 0844     01/03/18 1000  fluconazole (DIFLUCAN) IVPB 200 mg  Status:  Discontinued     200 mg 100 mL/hr over 60 Minutes Intravenous Every 24 hours 01/02/18 1525 01/04/18 0844   01/02/18 1100  vancomycin (VANCOCIN) 1,250 mg in sodium chloride 0.9 % 250 mL IVPB  Status:  Discontinued     1,250 mg 166.7 mL/hr over 90 Minutes Intravenous Every 8 hours 01/02/18 1025 01/04/18 1137   01/02/18 0900  sulfamethoxazole-trimethoprim (BACTRIM DS,SEPTRA DS) 800-160 MG per tablet 1 tablet     1 tablet Oral Once per day on Mon Wed Fri 12/31/17 1003     01/01/18 0930  fluconazole (DIFLUCAN) IVPB 400 mg  Status:  Discontinued     400 mg 100 mL/hr over 120 Minutes Intravenous Every 24 hours 01/01/18 0830 01/02/18 1525   12/31/17 2200  vancomycin (VANCOCIN) IVPB 1000 mg/200 mL premix  Status:  Discontinued     1,000 mg 200 mL/hr  over 60 Minutes Intravenous Every 8 hours 12/31/17 2048 01/02/18 1025   12/31/17 1400  cefTAZidime (FORTAZ) 2 g in dextrose 5 % 50 mL IVPB  Status:  Discontinued     2 g 100 mL/hr over 30 Minutes Intravenous Every 8 hours 12/31/17 1119 01/01/18 0808   12/31/17 1200  dolutegravir (TIVICAY) tablet 50 mg     50 mg Oral Daily 12/31/17 0207     12/31/17 1200  emtricitabine-rilpivir-tenofovir AF (ODEFSEY) 200-25-25 MG per tablet 1 tablet     1 tablet Oral Daily with breakfast 12/31/17 0207     12/31/17 1100  fluconazole (DIFLUCAN) tablet 400 mg  Status:  Discontinued     400 mg Oral Daily 12/31/17 1003 01/01/18 0829   12/31/17 1000  vancomycin (VANCOCIN) IVPB 1000 mg/200 mL premix  Status:  Discontinued     1,000 mg 200 mL/hr over 60 Minutes Intravenous 2 times daily  12/31/17 0444 12/31/17 2048   12/31/17 0215  sulfamethoxazole-trimethoprim (BACTRIM DS,SEPTRA DS) 800-160 MG per tablet 1 tablet  Status:  Discontinued     1 tablet Oral 2 times daily 12/31/17 0207 12/31/17 1003   12/31/17 0215  cefTRIAXone (ROCEPHIN) 2 g in dextrose 5 % 50 mL IVPB  Status:  Discontinued     2 g 100 mL/hr over 30 Minutes Intravenous Every 24 hours 12/31/17 0207 12/31/17 1119   12/30/17 2345  vancomycin (VANCOCIN) 1,500 mg in sodium chloride 0.9 % 500 mL IVPB     1,500 mg 250 mL/hr over 120 Minutes Intravenous  Once 12/30/17 2331 12/31/17 0324   12/30/17 2330  cefTRIAXone (ROCEPHIN) 2 g in dextrose 5 % 50 mL IVPB     2 g 100 mL/hr over 30 Minutes Intravenous  Once 12/30/17 2328 12/31/17 0122      Assessment/Plan: s/p Procedure(s): REMOVAL OF V-P SHUNT, PLACEMENT OF EVD (N/A) Advance diet to clear will go slow if tolerates.  If pt starts to have any nausea/emesis NPO Following  LOS: 14 days    Marigene Ehlers., [facility] 01/14/2018

## 2018-01-14 NOTE — Progress Notes (Signed)
PHARMACY - ADULT TOTAL PARENTERAL NUTRITION CONSULT NOTE   Pharmacy Consult:  TPN Indication:  Ileus  Patient Measurements: Height: '6\' 1"'  (185.4 cm) Weight: 173 lb 11.6 oz (78.8 kg) IBW/kg (Calculated) : 79.9 TPN AdjBW (KG): 78.8 Body mass index is 22.92 kg/m.  Assessment:  57 YOM with history of neurogenic bowel presented on 12/31/17 with fever and rash.  He was also having nausea and vomiting with hypoactive bowel sounds and distended abdomen on 01/01/18.  Diagnosed with ileus secondary to infected VP shunt.  Patient tolerated intermittent clamping of NG tube and consumed some clear liquids.  On 01/08/18, he vomited a large amount of green/clear bile.  Pharmacy consulted to manage TPN.  Patient reports no recent weight loss/gain and he was eating regularly PTA.  GI: prealbumin 8 >> 23.3, NGT came out 1/18, stool O/P 6m. 1/16 CT showed SBO - Reglan IV.   Endo: no hx DM - CBGs low normal Insulin requirements in the past 24 hours: 0 unit  Lytes: Na improves to 134, others WNL Renal: neurogenic bladder - SCr 0.97 stable, BUN WNL.  UOP 1.1 ml/kg/hr, NS 20K at 10 ml/hr, net -13L Pulm: stable on RA Cards: VSS Hepatobil: Hep B - AST/alk phos mildly elevated, others WNL.  TG high normal (150) - watch Neuro: Bipolar / depression / CVA / seizure - Keppra IV, PRN APAP/morphine/Seroquel ID: s/p Vanc for CoNS bacteremia + VP shunt infxn.  Unasyn > CTX/Flagyl for cellulitis/abdominal abscess.  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   Electrolytes in TPN: increase Na, no change K/Phos/Mag, Cl:Ac 1:2 Daily multivitamin and trace elements in TPN D/C SSI.  Continue CBG checks Q12H to monitor for hypoglycemia. F/U with diet advancement to wean TPN   Justin Ball D. DMina Marble PharmD, BCPS Pager:  [phone]/21/2019, 7:26 AM

## 2018-01-14 NOTE — Progress Notes (Signed)
Physical Therapy Treatment Patient Details Name: Justin Ball MRN: [id number] DOB: [birth year] Today's Date: 01/14/2018    History of Present Illness 27 y.o. 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 Pt currently minA for bed mobility, transfers and ambulation of 400 ft with RW. Pt continues to have decreased R sided awareness and needs cuing to avoid obstacles on R. D/c plans remain appropriate at this time, however important to have 24 hr care. PT will continue to follow acutely until d/c.      Follow Up Recommendations  No PT follow up;Supervision/Assistance - 24 hour     Equipment Recommendations  None recommended by PT    Recommendations for Other Services       Precautions / Restrictions Precautions Precautions: Fall Restrictions Weight Bearing Restrictions: No    Mobility  Bed Mobility Overal bed mobility: Needs Assistance Bed Mobility: Supine to Sit     Supine to sit: Min assist     General bed mobility comments: pt able to pull self to EoB with use of bedrails, required minA for trunk to upright    Transfers Overall transfer level: Needs assistance Equipment used: Rolling walker (2 wheeled) Transfers: Sit to/from Stand Sit to Stand: Min assist         General transfer comment: minA for steadying with powerup, good steadying  Ambulation/Gait Ambulation/Gait assistance: Min assist Ambulation Distance (Feet): 300 Feet Assistive device: Rolling walker (2 wheeled) Gait Pattern/deviations: Step-through pattern;Decreased stride length;Narrow base of support;Drifts right/left Gait velocity: slow Gait velocity interpretation: Below normal speed for age/gender General Gait Details: minA for  steadying in RW, vc for increased BoS and increased foot clearance, as well as for drifting to the R       Balance Overall balance assessment: Needs assistance Sitting-balance support: Feet unsupported Sitting balance-Leahy Scale: Fair     Standing balance support: No upper extremity supported Standing balance-Leahy Scale: Fair                              Cognition Arousal/Alertness: Awake/alert Behavior During Therapy: Flat affect Overall Cognitive Status: Impaired/Different from baseline Area of Impairment: Problem solving                   Current Attention Level: Sustained Memory: Decreased short-term memory Following Commands: Follows multi-step commands with increased time Safety/Judgement: Decreased awareness of safety;Decreased awareness of deficits   Problem Solving: Slow processing;Decreased initiation;Requires verbal cues;Difficulty sequencing;Requires tactile cues General Comments: ptvery flat and kept repeating he was sleepy but followed multistep commands with increased time             Pertinent Vitals/Pain Pain Assessment: Faces Faces Pain Scale: Hurts even more Pain Location: headache  Pain Descriptors / Indicators: Grimacing;Headache Pain Intervention(s): Limited activity within patient's tolerance;Monitored during session;Patient requesting pain meds-RN notified           PT Goals (current goals can now be found in the care plan section) Acute Rehab PT Goals PT Goal Formulation: With patient/family Time For Goal Achievement: 01/21/18 Potential to Achieve Goals: Good Progress towards PT goals: Progressing toward goals    Frequency    Min 3X/week      PT Plan  Current plan remains appropriate       AM-PAC PT "6 Clicks" Daily Activity  Outcome Measure  Difficulty turning over in bed (including adjusting bedclothes, sheets and blankets)?: A Little Difficulty moving from lying on back to sitting on the side of the  bed? : A Little Difficulty sitting down on and standing up from a chair with arms (e.g., wheelchair, bedside commode, etc,.)?: Unable Help needed moving to and from a bed to chair (including a wheelchair)?: A Lot Help needed walking in hospital room?: A Lot Help needed climbing 3-5 steps with a railing? : A Lot 6 Click Score: 13    End of Session Equipment Utilized During Treatment: Gait belt Activity Tolerance: Patient tolerated treatment well Patient left: in chair;with call bell/phone within reach;with chair alarm set Nurse Communication: Mobility status PT Visit Diagnosis: Other abnormalities of gait and mobility (R26.89)     Time: [id number] PT Time Calculation (min) (ACUTE ONLY): 24 min  Charges:  $Gait Training: 23-37 mins                    G Codes:       Jesseka Drinkard B. Beverely RisenVan Ball PT, DPT Acute Rehabilitation  [phone] Pager [phone]     Justin Ball 01/14/2018, 3:18 PM

## 2018-01-14 NOTE — Progress Notes (Signed)
[facility] PROGRESS NOTE  Justin Ball ZOX:[id number] DOB: 04-Mar-1991 DOA: 12/30/2017 PCP: No primary care provider on file.  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 Patient was seen by the PCP several days prior to admission with history of rash along his abd wall where his VP shunt was. Patient was initially placed on bactrim. The rash got worse and was tracking along the VP shunt location (under his skin along his chest wall). Patient was admitted with sepsis, and VP shunt infection. Patient has undergone VP shunt removal and placement of EVD (on 01/01/18) by Neurosurgery. Patient also developed ileus secondary to VP shunt infection.    Assessment/Plan:  Sepsis. likely related to shunt infection. Presented with fever, leukocytosis, tachycardia, tachypnea.  Neurosurgery consulted. Patient underwent  VP shunt removal, placement of EVD on 1-08. Blood culture grew Staph Epi and Strept Pneumonia. CSF culture: NGTD. Culture VP shunt from brain grew Haemophilus.  -improved IV Vanco, as well as Medication for HIV and Prophylaxis (Bactrim and fluconazole). Cont antibiotics per ID. Appreciate ID, neurosurgery input  Finished treatment with vancomycin for Staph epi bacteremia.   Ileus. SBO, Multiples intra-abdominal abscess.  General surgery following.  CT abdomen with intra-abdominal abscess.  recommendations.  IR consulted didn't recommended drainage placement.  -Continue with TPN.  -NGT came out. Sx recommend sips of clears.  -started on Unasyn 1-16 change to ceftriaxone and flagyl 1-18 -WBC trending down.  -follow sx recommendation regarding repeating CT scan.   AIDS: Continue with Tivicay, Odefsey. ID following. On Fluconazole for history of cryptococcus Meningitis.   Seizure: Continue with Keppra.   Abnormal electrolytes. Cont replacing , monitor labs   S/P VP Shunt. Neurosurgery has  been consulted due to infection. 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 EVD discontinued on 11/15. Cont per neurosurgery.  -CT head stable per neurosurgery   Chronic viral hepatitis. Bipolar; on Seroquel.     Code Status: full Family Communication: d/w patient, Disposition Plan: pend clinical improvement    Consultants:  ID  neurosurg  Procedures:  VP shunt removal   Antibiotics: Anti-infectives (From admission, onward)   Start     Dose/Rate Route Frequency Ordered Stop   01/11/18 1600  cefTRIAXone (ROCEPHIN) 2 g in dextrose 5 % 50 mL IVPB     2 g 100 mL/hr over 30 Minutes Intravenous Every 24 hours 01/11/18 1435     01/11/18 1500  metroNIDAZOLE (FLAGYL) IVPB 500 mg     500 mg 100 mL/hr over 60 Minutes Intravenous Every 8 hours 01/11/18 1435     01/09/18 1800  Ampicillin-Sulbactam (UNASYN) 3 g in sodium chloride 0.9 % 100 mL IVPB  Status:  Discontinued     3 g 200 mL/hr over 30 Minutes Intravenous Every 6 hours 01/09/18 1730 01/11/18 1435   01/07/18 1314  vancomycin (VANCOCIN) IVPB 750 mg/150 ml premix  Status:  Discontinued     750 mg 150 mL/hr over 60 Minutes Intravenous Every 8 hours 01/07/18 1314 01/10/18 1051   01/05/18 0500  vancomycin (VANCOCIN) IVPB 1000 mg/200 mL premix  Status:  Discontinued     1,000 mg 200 mL/hr over 60 Minutes Intravenous Every 8 hours 01/04/18 1349 01/07/18 1314   01/04/18 1000  fluconazole (DIFLUCAN) tablet 200 mg     200 mg Oral Daily 01/04/18 0844     01/03/18 1000  fluconazole (DIFLUCAN) IVPB 200  mg  Status:  Discontinued     200 mg 100 mL/hr over 60 Minutes Intravenous Every 24 hours 01/02/18 1525 01/04/18 0844   01/02/18 1100  vancomycin (VANCOCIN) 1,250 mg in sodium chloride 0.9 % 250 mL IVPB  Status:  Discontinued     1,250 mg 166.7 mL/hr over 90 Minutes Intravenous Every 8 hours 01/02/18 1025 01/04/18 1137   01/02/18 0900  sulfamethoxazole-trimethoprim (BACTRIM  DS,SEPTRA DS) 800-160 MG per tablet 1 tablet     1 tablet Oral Once per day on Mon Wed Fri 12/31/17 1003     01/01/18 0930  fluconazole (DIFLUCAN) IVPB 400 mg  Status:  Discontinued     400 mg 100 mL/hr over 120 Minutes Intravenous Every 24 hours 01/01/18 0830 01/02/18 1525   12/31/17 2200  vancomycin (VANCOCIN) IVPB 1000 mg/200 mL premix  Status:  Discontinued     1,000 mg 200 mL/hr over 60 Minutes Intravenous Every 8 hours 12/31/17 2048 01/02/18 1025   12/31/17 1400  cefTAZidime (FORTAZ) 2 g in dextrose 5 % 50 mL IVPB  Status:  Discontinued     2 g 100 mL/hr over 30 Minutes Intravenous Every 8 hours 12/31/17 1119 01/01/18 0808   12/31/17 1200  dolutegravir (TIVICAY) tablet 50 mg     50 mg Oral Daily 12/31/17 0207     12/31/17 1200  emtricitabine-rilpivir-tenofovir AF (ODEFSEY) 200-25-25 MG per tablet 1 tablet     1 tablet Oral Daily with breakfast 12/31/17 0207     12/31/17 1100  fluconazole (DIFLUCAN) tablet 400 mg  Status:  Discontinued     400 mg Oral Daily 12/31/17 1003 01/01/18 0829   12/31/17 1000  vancomycin (VANCOCIN) IVPB 1000 mg/200 mL premix  Status:  Discontinued     1,000 mg 200 mL/hr over 60 Minutes Intravenous 2 times daily 12/31/17 0444 12/31/17 2048   12/31/17 0215  sulfamethoxazole-trimethoprim (BACTRIM DS,SEPTRA DS) 800-160 MG per tablet 1 tablet  Status:  Discontinued     1 tablet Oral 2 times daily 12/31/17 0207 12/31/17 1003   12/31/17 0215  cefTRIAXone (ROCEPHIN) 2 g in dextrose 5 % 50 mL IVPB  Status:  Discontinued     2 g 100 mL/hr over 30 Minutes Intravenous Every 24 hours 12/31/17 0207 12/31/17 1119   12/30/17 2345  vancomycin (VANCOCIN) 1,500 mg in sodium chloride 0.9 % 500 mL IVPB     1,500 mg 250 mL/hr over 120 Minutes Intravenous  Once 12/30/17 2331 12/31/17 0324   12/30/17 2330  cefTRIAXone (ROCEPHIN) 2 g in dextrose 5 % 50 mL IVPB     2 g 100 mL/hr over 30 Minutes Intravenous  Once 12/30/17 2328 12/31/17 0122        HPI/Subjective: flexy seal  removed. Denies nausea or vomiting. No BM since yesterday     Objective: Vitals:   01/14/18 0511 01/14/18 0952  BP: (!) 92/59 (!) 91/49  Pulse: 82 79  Resp: 18 16  Temp: 97.6 F (36.4 C) (!) 97.5 F (36.4 C)  SpO2: 98% 97%    Intake/Output Summary (Last 24 hours) at 01/14/2018 1354 Last data filed at 01/14/2018 0600 Gross per 24 hour  Intake 1526 ml  Output 900 ml  Net 626 ml   Filed Weights   12/30/17 1808 12/31/17 0758  Weight: 81.6 kg (180 lb) 78.8 kg (173 lb 11.6 oz)    Exam:   General:NAD  Cardiovascular; S 1, S 2 RRR  Respiratory: CTA  Abdomen: BS present, soft, nt  Musculoskeletal: no edmea  Data Reviewed: Basic Metabolic Panel: Recent Labs  Lab 01/09/18 0712 01/10/18 1056 01/11/18 0416 01/12/18 0330 01/13/18 0528 01/14/18 0406  NA 136 136 137 133* 133* 134*  K 3.3* 3.7 4.1 4.5 4.4 4.3  CL 104 104 103 98* 100* 101  CO2 24 23 24 23 23 22   GLUCOSE 119* 93 96 93 105* 105*  BUN <5* <5* <5* 9 13 17   CREATININE 0.85 0.48* 0.90 0.80 0.94 0.97  CALCIUM 8.4* 8.6* 8.9 9.1 9.3 9.3  MG 1.6* 1.8 2.1 1.9 2.1 2.0  PHOS 3.1 3.7 4.2  --  4.7* 4.2   Liver Function Tests: Recent Labs  Lab 01/09/18 0712 01/10/18 1056 01/14/18 0406  AST 31 32 46*  ALT 18 15* 27  ALKPHOS 69 74 144*  BILITOT 1.7* 1.4* 1.1  PROT 7.0 7.4 8.6*  ALBUMIN 2.3* 2.6* 3.1*   No results for input(s): LIPASE, AMYLASE in the last 168 hours. No results for input(s): AMMONIA in the last 168 hours. CBC: Recent Labs  Lab 01/09/18 0712 01/10/18 1043 01/11/18 0416 01/13/18 0528 01/14/18 0406  WBC 17.8* 22.8* 21.5* 17.3* 15.6*  NEUTROABS 14.3*  --   --   --  12.6*  HGB 10.3* 10.3* 10.7* 10.8* 10.6*  HCT 31.7* 31.7* 32.4* 33.3* 31.4*  MCV 95.8 96.9 97.3 96.5 96.6  PLT 451* 424* 423* 427* 390   Cardiac Enzymes: No results for input(s): CKTOTAL, CKMB, CKMBINDEX, TROPONINI in the last 168 hours. BNP (last 3 results) No results for input(s): BNP in the last 8760 hours.  ProBNP  (last 3 results) No results for input(s): PROBNP in the last 8760 hours.  CBG: Recent Labs  Lab 01/13/18 1216 01/13/18 1658 01/14/18 0015 01/14/18 0023 01/14/18 0622  GLUCAP 112* 108* 106* 110* 76    Recent Results (from the past 240 hour(s))  C difficile quick scan w PCR reflex     Status: None   Collection Time: 01/09/18  9:36 AM  Result Value Ref Range Status   C Diff antigen NEGATIVE NEGATIVE Final   C Diff toxin NEGATIVE NEGATIVE Final   C Diff interpretation No C. difficile detected.  Final     Studies: No results found.  Scheduled Meds: . chlorhexidine  15 mL Mouth Rinse BID  . Chlorhexidine Gluconate Cloth  6 each Topical Daily  . dolutegravir  50 mg Oral Daily  . emtricitabine-rilpivir-tenofovir AF  1 tablet Oral Q breakfast  . fluconazole  200 mg Oral Daily  . mouth rinse  15 mL Mouth Rinse q12n4p  . metoCLOPramide (REGLAN) injection  10 mg Intravenous Q6H  . sodium chloride flush  10-40 mL Intracatheter Q12H  . sulfamethoxazole-trimethoprim  1 tablet Oral Once per day on Mon Wed Fri   Continuous Infusions: . 0.9 % NaCl with KCl 20 mEq / L 10 mL/hr at 01/11/18 1723  . cefTRIAXone (ROCEPHIN)  IV Stopped (01/13/18 1557)  . levETIRAcetam Stopped (01/14/18 0841)  . metronidazole Stopped (01/14/18 0741)  . TPN ADULT (ION) 80 mL/hr at 01/13/18 2155  . 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 If 7PM-7AM, please contact night-coverage at [email or web address], password [facility] 01/14/2018, 1:54 PM  LOS: 14 days

## 2018-01-15 DIAGNOSIS — Z960 Presence of urogenital implants: Secondary | ICD-10-CM

## 2018-01-15 DIAGNOSIS — Z888 Allergy status to other drugs, medicaments and biological substances status: Secondary | ICD-10-CM

## 2018-01-15 LAB — CBC
HEMATOCRIT: 31.8 % — AB (ref 39.0–52.0)
HEMOGLOBIN: 10.1 g/dL — AB (ref 13.0–17.0)
MCH: 31 pg (ref 26.0–34.0)
MCHC: 31.8 g/dL (ref 30.0–36.0)
MCV: 97.5 fL (ref 78.0–100.0)
Platelets: 374 10*3/uL (ref 150–400)
RBC: 3.26 MIL/uL — ABNORMAL LOW (ref 4.22–5.81)
RDW: 14.3 % (ref 11.5–15.5)
WBC: 13 10*3/uL — ABNORMAL HIGH (ref 4.0–10.5)

## 2018-01-15 LAB — GLUCOSE, CAPILLARY
GLUCOSE-CAPILLARY: 116 mg/dL — AB (ref 65–99)
GLUCOSE-CAPILLARY: 104 mg/dL — AB (ref 65–99)
Glucose-Capillary: 116 mg/dL — ABNORMAL HIGH (ref 65–99)
Glucose-Capillary: 100 mg/dL — ABNORMAL HIGH (ref 65–99)

## 2018-01-15 LAB — BASIC METABOLIC PANEL
Anion gap: 11 (ref 5–15)
BUN: 16 mg/dL (ref 6–20)
CALCIUM: 9 mg/dL (ref 8.9–10.3)
CHLORIDE: 100 mmol/L — AB (ref 101–111)
CO2: 23 mmol/L (ref 22–32)
CREATININE: 0.9 mg/dL (ref 0.61–1.24)
GFR calc Af Amer: >60 mL/min (ref >60)
GFR calc non Af Amer: >60 mL/min (ref >60)
GLUCOSE: 109 mg/dL — AB (ref 65–99)
Potassium: 4.3 mmol/L (ref 3.5–5.1)
Sodium: 134 mmol/L — ABNORMAL LOW (ref 135–145)

## 2018-01-15 MED ORDER — TRAVASOL 10 % IV SOLN
INTRAVENOUS | Status: AC
  Administered 2018-01-15: 18:00:00 via INTRAVENOUS
  Filled 2018-01-15: qty 820.8

## 2018-01-15 MED ORDER — METRONIDAZOLE IN NACL 5-0.79 MG/ML-% IV SOLN
500.0000 mg | Freq: Three times a day (TID) | INTRAVENOUS | Status: DC
  Administered 2018-01-15 – 2018-01-16 (×3): 500 mg via INTRAVENOUS
  Filled 2018-01-15 (×4): qty 100

## 2018-01-15 MED ORDER — METRONIDAZOLE 500 MG PO TABS
500.0000 mg | ORAL_TABLET | Freq: Three times a day (TID) | ORAL | Status: DC
  Administered 2018-01-15: 500 mg via ORAL
  Filled 2018-01-15: qty 1

## 2018-01-15 NOTE — Progress Notes (Signed)
14 Days Post-Op   Subjective/Chief Complaint: Tol CLD No BMs, passing flatus   Objective: Vital signs in last 24 hours: Temp:  [97.5 F (36.4 C)-98.3 F (36.8 C)] 98.3 F (36.8 C) (01/22 0600) Pulse Rate:  [66-80] 66 (01/22 0600) Resp:  [16-18] 17 (01/22 0600) BP: (91-105)/(49-68) 99/57 (01/22 0600) SpO2:  [97 %-98 %] 98 % (01/22 0600) Last BM Date: 01/13/18  Intake/Output from previous day: 01/21 0701 - 01/22 0700 In: 833.1 [I.V.:313.1; IV Piggyback:520] Out: 1350 [Urine:1350] Intake/Output this shift: No intake/output data recorded.  Constitutional: No acute distress, conversant, appears states age. Eyes: Anicteric sclerae, moist conjunctiva, no lid lag Lungs: Clear to auscultation bilaterally, normal respiratory effort CV: regular rate and rhythm, no murmurs, no peripheral edema, pedal pulses 2+ GI: Soft, no masses or hepatosplenomegaly, non-tender to palpation, ND, active BS Skin: No rashes, palpation reveals normal turgor Psychiatric: appropriate judgment and insight, oriented to person, place, and time   Lab Results:  Recent Labs    01/13/18 0528 01/14/18 0406  WBC 17.3* 15.6*  HGB 10.8* 10.6*  HCT 33.3* 31.4*  PLT 427* 390   BMET Recent Labs    01/13/18 0528 01/14/18 0406  NA 133* 134*  K 4.4 4.3  CL 100* 101  CO2 23 22  GLUCOSE 105* 105*  BUN 13 17  CREATININE 0.94 0.97  CALCIUM 9.3 9.3   PT/INR No results for input(s): LABPROT, INR in the last 72 hours. ABG No results for input(s): PHART, HCO3 in the last 72 hours.  Invalid input(s): PCO2, PO2  Studies/Results: No results found.  Anti-infectives: Anti-infectives (From admission, onward)   Start     Dose/Rate Route Frequency Ordered Stop   01/11/18 1600  cefTRIAXone (ROCEPHIN) 2 g in dextrose 5 % 50 mL IVPB     2 g 100 mL/hr over 30 Minutes Intravenous Every 24 hours 01/11/18 1435     01/11/18 1500  metroNIDAZOLE (FLAGYL) IVPB 500 mg     500 mg 100 mL/hr over 60 Minutes Intravenous  Every 8 hours 01/11/18 1435     01/09/18 1800  Ampicillin-Sulbactam (UNASYN) 3 g in sodium chloride 0.9 % 100 mL IVPB  Status:  Discontinued     3 g 200 mL/hr over 30 Minutes Intravenous Every 6 hours 01/09/18 1730 01/11/18 1435   01/07/18 1314  vancomycin (VANCOCIN) IVPB 750 mg/150 ml premix  Status:  Discontinued     750 mg 150 mL/hr over 60 Minutes Intravenous Every 8 hours 01/07/18 1314 01/10/18 1051   01/05/18 0500  vancomycin (VANCOCIN) IVPB 1000 mg/200 mL premix  Status:  Discontinued     1,000 mg 200 mL/hr over 60 Minutes Intravenous Every 8 hours 01/04/18 1349 01/07/18 1314   01/04/18 1000  fluconazole (DIFLUCAN) tablet 200 mg     200 mg Oral Daily 01/04/18 0844     01/03/18 1000  fluconazole (DIFLUCAN) IVPB 200 mg  Status:  Discontinued     200 mg 100 mL/hr over 60 Minutes Intravenous Every 24 hours 01/02/18 1525 01/04/18 0844   01/02/18 1100  vancomycin (VANCOCIN) 1,250 mg in sodium chloride 0.9 % 250 mL IVPB  Status:  Discontinued     1,250 mg 166.7 mL/hr over 90 Minutes Intravenous Every 8 hours 01/02/18 1025 01/04/18 1137   01/02/18 0900  sulfamethoxazole-trimethoprim (BACTRIM DS,SEPTRA DS) 800-160 MG per tablet 1 tablet     1 tablet Oral Once per day on Mon Wed Fri 12/31/17 1003     01/01/18 0930  fluconazole (DIFLUCAN) IVPB  400 mg  Status:  Discontinued     400 mg 100 mL/hr over 120 Minutes Intravenous Every 24 hours 01/01/18 0830 01/02/18 1525   12/31/17 2200  vancomycin (VANCOCIN) IVPB 1000 mg/200 mL premix  Status:  Discontinued     1,000 mg 200 mL/hr over 60 Minutes Intravenous Every 8 hours 12/31/17 2048 01/02/18 1025   12/31/17 1400  cefTAZidime (FORTAZ) 2 g in dextrose 5 % 50 mL IVPB  Status:  Discontinued     2 g 100 mL/hr over 30 Minutes Intravenous Every 8 hours 12/31/17 1119 01/01/18 0808   12/31/17 1200  dolutegravir (TIVICAY) tablet 50 mg     50 mg Oral Daily 12/31/17 0207     12/31/17 1200  emtricitabine-rilpivir-tenofovir AF (ODEFSEY) 200-25-25 MG per  tablet 1 tablet     1 tablet Oral Daily with breakfast 12/31/17 0207     12/31/17 1100  fluconazole (DIFLUCAN) tablet 400 mg  Status:  Discontinued     400 mg Oral Daily 12/31/17 1003 01/01/18 0829   12/31/17 1000  vancomycin (VANCOCIN) IVPB 1000 mg/200 mL premix  Status:  Discontinued     1,000 mg 200 mL/hr over 60 Minutes Intravenous 2 times daily 12/31/17 0444 12/31/17 2048   12/31/17 0215  sulfamethoxazole-trimethoprim (BACTRIM DS,SEPTRA DS) 800-160 MG per tablet 1 tablet  Status:  Discontinued     1 tablet Oral 2 times daily 12/31/17 0207 12/31/17 1003   12/31/17 0215  cefTRIAXone (ROCEPHIN) 2 g in dextrose 5 % 50 mL IVPB  Status:  Discontinued     2 g 100 mL/hr over 30 Minutes Intravenous Every 24 hours 12/31/17 0207 12/31/17 1119   12/30/17 2345  vancomycin (VANCOCIN) 1,500 mg in sodium chloride 0.9 % 500 mL IVPB     1,500 mg 250 mL/hr over 120 Minutes Intravenous  Once 12/30/17 2331 12/31/17 0324   12/30/17 2330  cefTRIAXone (ROCEPHIN) 2 g in dextrose 5 % 50 mL IVPB     2 g 100 mL/hr over 30 Minutes Intravenous  Once 12/30/17 2328 12/31/17 0122      Assessment/Plan: s/p Procedure(s): REMOVAL OF V-P SHUNT, PLACEMENT OF EVD (N/A) Tol CLD, will adv to FLD    LOS: 15 days    Marigene Ehlers., Jed Limerick 01/15/2018

## 2018-01-15 NOTE — Progress Notes (Signed)
Nutrition Follow-up  DOCUMENTATION CODES:   Not applicable  INTERVENTION:  Monitor PO tolerance, will provide ONS as needed  Recommend daily weights  TPN per pharmacy   NUTRITION DIAGNOSIS:   Inadequate oral intake related to inability to eat as evidenced by NPO status. 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 Pt with NG tube on/off clamped. On 1/15 pt vomited large amount per surgery starting SB protocol with NPO x 1 week, IV reglan started, keep K+ >4.0, NGT to LIWS. Plan to start TPN.   S/p removal of V-P shunt, placement of EVD 1/9 EVD removed 1/15 NGT removed  Patient had full liquid tray at bedside during visit around lunch time. He says "I'm full," but said he had some this morning.    Intake/Output Summary (Last 24 hours) at 01/15/2018 1401 Last data filed at 01/15/2018 1014 Gross per 24 hour  Intake 833.08 ml  Output 2350 ml  Net -1516.92 ml  -14L Fluid Negative  Labs reviewed:  Na 134 Triglycerides 150  Medications reviewed and include:  Reglan NS 20 K+ at 65m/hr  Diet Order:  TPN ADULT (ION) Diet full liquid Room service appropriate? Yes; Fluid consistency: Thin TPN ADULT (ION)  EDUCATION NEEDS:   No education needs have been identified at this time  Skin:  Skin Assessment: Reviewed RN Assessment  Last BM:  1/20  Height:   Ht Readings from Last 1 Encounters:  12/31/17 '6\' 1"'  (1.854 m)    Weight:   Wt Readings from Last 1 Encounters:  12/31/17 173 lb 11.6 oz (78.8 kg)    Ideal Body Weight:  83.6 kg  BMI:  Body mass index is 22.92 kg/m.  Estimated Nutritional Needs:   Kcal:  2200-2400  Protein:  110-125 grams  Fluid:  > 2 L/day  WSatira Anis Aolanis Crispen, MS, RD  LDN Inpatient Clinical Dietitian Pager [phone]

## 2018-01-15 NOTE — Progress Notes (Signed)
[facility]  Date of Admission:  12/30/2017     Total days of antibiotics 14   Day 5 Ceftriaxone    Day 5 Flagyl  Reason for Visit: Follow up on abdominal abscess          Patient ID: Justin Ball is a 27 y.o. 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 chlorhexidine  15 mL Mouth Rinse BID  . Chlorhexidine Gluconate Cloth  6 each Topical Daily  . dolutegravir  50 mg Oral Daily  . emtricitabine-rilpivir-tenofovir AF  1 tablet Oral Q breakfast  . fluconazole  200 mg Oral Daily  . mouth rinse  15 mL Mouth Rinse q12n4p  . metoCLOPramide (REGLAN) injection  10 mg Intravenous Q6H  . nicotine  14 mg Transdermal Daily  . sodium chloride flush  10-40 mL Intracatheter Q12H  . sulfamethoxazole-trimethoprim  1 tablet Oral Once per day on Mon Wed Fri   Allergies  Allergen Reactions  . Acyclovir And Related     Interval History:  Feeling well today but he is very hungry. Tolerating his clear liquid diet well and was recently advanced to full's but has not had a tray to trial new consistency yet. Afebrile and stable over night. He is wondering when he can go home. No abdominal pain at all and he is passing flatus.   OBJECTIVE: Vitals:   01/14/18 1854 01/14/18 2144 01/15/18 0126 01/15/18 0600  BP: 98/60 101/68 105/67 (!) 99/57  Pulse: 76 72 69 66  Resp: 18 16 16 17   Temp: 98 F (36.7 C) 98.1 F (36.7 C) 98 F (36.7 C) 98.3 F (36.8 C)  TempSrc: Oral Oral Oral Oral  SpO2: 98% 97% 98% 98%  Weight:      Height:       Body mass index is 22.92 kg/m.  Physical Exam  Constitutional: He  is oriented to person, place, and time and well-developed, well-nourished, and in no distress.  Lying in bed in no distress. More awake and conversational today.   HENT:  Mouth/Throat: Oropharynx is clear and moist. No oral lesions. Normal dentition. No dental caries.  Staples noted to right temporal region clean and dry.   Eyes: EOM are normal. Pupils are equal, round, and reactive to light. No scleral icterus.  Neck: Normal range of motion.  Cardiovascular: Normal rate, regular rhythm and normal heart sounds.  Pulmonary/Chest: Effort normal and breath sounds normal.  Abdominal: Soft. Bowel sounds are normal. He exhibits no distension. There is no tenderness. There is no rigidity.  Genitourinary:  Genitourinary Comments: Suprapubic catheter in place. Clean but no dressing.   Lymphadenopathy:    He has no cervical adenopathy.  Neurological: He is alert and oriented to person, place, and time.  Skin: Skin is warm and dry. No rash noted.  Psychiatric: Mood and affect normal.  Nursing note and vitals reviewed. RUE PICC site clean and dry.   Lab Results Lab Results  Component Value Date   WBC 15.6 (H) 01/14/2018   HGB 10.6 (L) 01/14/2018   HCT 31.4 (  L) 01/14/2018   MCV 96.6 01/14/2018   PLT 390 01/14/2018    Lab Results  Component Value Date   CREATININE 0.97 01/14/2018   BUN 17 01/14/2018   NA 134 (L) 01/14/2018   K 4.3 01/14/2018   CL 101 01/14/2018   CO2 22 01/14/2018    Lab Results  Component Value Date   ALT 27 01/14/2018   AST 46 (H) 01/14/2018   ALKPHOS 144 (H) 01/14/2018   BILITOT 1.1 01/14/2018     Microbiology: Recent Results (from the past 240 hour(s))  C difficile quick scan w PCR reflex     Status: None   Collection Time: 01/09/18  9:36 AM  Result Value Ref Range Status   C Diff antigen NEGATIVE NEGATIVE Final   C Diff toxin NEGATIVE NEGATIVE Final   C Diff interpretation No C. difficile detected.  Final   ASSESSMENT: 1. Abdominal abscesses 1/16 scan  2/2 infected VP shunt. (Removed 01/01/18) 2. Coagulase negative staph bacteremia -- tx completed with vanc x 10d 3. VP shunt infection - haemophilus influenzae  4. HIV - recent CD4 400 after several months of viral suppression 5. H/O Cryptococcal Meningitis   PLAN: 1. Will change Flagyl to 500 mg TID PO considering he has been able to tolerate other PO medications.  2. Whenever he is ready from surgical perspective to be discharged home we can arrange to continue Ceftriaxone IV with outpatient therapy - he is currently on day 7 of treatment for his intraabdominal abscesses. Would consider repeating CT again sometime in the next 2 weeks to re-evaluate; we will consider changing to PO regimen after this barring his leukocytosis resolves and he continues to improve.  3. Would check CBC in AM to continue to trend WBC count.  4. Continue Tivicay + Odefsey for HIV infection which is very well controlled. 5. Continue Bactrim 3x/week for another 2 months barring his CD4 continues to remain above 200.   6. Continue Fluconazole 200 mg daily for maintenance therapy from his cryptococcal meningitis. His CD4 only recently over 200.   Rexene AlbertsStephanie Zayley Arras, MSN, NP-C [facility] Pager: [phone]  01/15/2018  9:51 AM

## 2018-01-15 NOTE — Progress Notes (Signed)
[facility] PROGRESS NOTE  Justin Ball ZOX:[id number] DOB: 06/30/1991 DOA: 12/30/2017 PCP: No primary care provider on file.  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 Patient was seen by the PCP several days prior to admission with history of rash along his abd wall where his VP shunt was. Patient was initially placed on bactrim. The rash got worse and was tracking along the VP shunt location (under his skin along his chest wall). Patient was admitted with sepsis, and VP shunt infection. Patient has undergone VP shunt removal and placement of EVD (on 01/01/18) by Neurosurgery. Patient also developed ileus and multiple abdominal abscess secondary to VP shunt infection. He was also treated for Staph epi Bacteremia with vancomycin. He completed course.    Assessment/Plan:  Sepsis. likely related to shunt infection. Presented with fever, leukocytosis, tachycardia, tachypnea.  Neurosurgery consulted. Patient underwent  VP shunt removal, placement of EVD on 1-08. Blood culture grew Staph Epi and Strept Pneumonia. CSF culture: NGTD. Culture VP shunt from brain grew Haemophilus.  -improved IV Vanco, as well as Medication for HIV and Prophylaxis (Bactrim and fluconazole). Cont antibiotics per ID. Appreciate ID, neurosurgery input  Finished treatment with vancomycin for Staph epi bacteremia.   Ileus. SBO, Multiples intra-abdominal abscess.  General surgery following.  CT abdomen with intra-abdominal abscess.  IR consulted didn't recommended drainage placement.  -Continue with TPN.  -NGT came out.  -started on Unasyn 1-16 change to ceftriaxone and flagyl 1-18 -WBC trending down. 22----Today at 13.  -follow sx recommendation regarding repeating CT scan.  -on full liquid diet.  -ID recommending IV ceftriaxone at discharge and oral flafy.   AIDS: Continue with Tivicay, Odefsey. ID following. On Fluconazole  for history of cryptococcus Meningitis.   Seizure: Continue with Keppra.   Abnormal electrolytes. Cont replacing , monitor labs   S/P VP Shunt. Neurosurgery has been consulted due to infection. 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 EVD discontinued on 11/15. Cont per neurosurgery.  -CT head stable per neurosurgery   Chronic viral hepatitis. Bipolar; on Seroquel.     Code Status: full Family Communication: d/w patient, Disposition Plan: pend clinical improvement . Awaiting resolution of ileus, needs repeat CT scan. Needs to be discharge on IV ceftriaxone probably    Consultants:  ID  neurosurg  Procedures:  VP shunt removal   Antibiotics: Anti-infectives (From admission, onward)   Start     Dose/Rate Route Frequency Ordered Stop   01/15/18 1400  metroNIDAZOLE (FLAGYL) tablet 500 mg     500 mg Oral Every 8 hours 01/15/18 1017     01/11/18 1600  cefTRIAXone (ROCEPHIN) 2 g in dextrose 5 % 50 mL IVPB     2 g 100 mL/hr over 30 Minutes Intravenous Every 24 hours 01/11/18 1435     01/11/18 1500  metroNIDAZOLE (FLAGYL) IVPB 500 mg  Status:  Discontinued     500 mg 100 mL/hr over 60 Minutes Intravenous Every 8 hours 01/11/18 1435 01/15/18 1017   01/09/18 1800  Ampicillin-Sulbactam (UNASYN) 3 g in sodium chloride 0.9 % 100 mL IVPB  Status:  Discontinued     3 g 200 mL/hr over 30 Minutes Intravenous Every 6 hours 01/09/18 1730 01/11/18 1435   01/07/18 1314  vancomycin (VANCOCIN) IVPB 750 mg/150 ml premix  Status:  Discontinued     750 mg 150 mL/hr over 60 Minutes Intravenous  Every 8 hours 01/07/18 1314 01/10/18 1051   01/05/18 0500  vancomycin (VANCOCIN) IVPB 1000 mg/200 mL premix  Status:  Discontinued     1,000 mg 200 mL/hr over 60 Minutes Intravenous Every 8 hours 01/04/18 1349 01/07/18 1314   01/04/18 1000  fluconazole (DIFLUCAN) tablet 200 mg     200 mg Oral Daily 01/04/18 0844     01/03/18 1000   fluconazole (DIFLUCAN) IVPB 200 mg  Status:  Discontinued     200 mg 100 mL/hr over 60 Minutes Intravenous Every 24 hours 01/02/18 1525 01/04/18 0844   01/02/18 1100  vancomycin (VANCOCIN) 1,250 mg in sodium chloride 0.9 % 250 mL IVPB  Status:  Discontinued     1,250 mg 166.7 mL/hr over 90 Minutes Intravenous Every 8 hours 01/02/18 1025 01/04/18 1137   01/02/18 0900  sulfamethoxazole-trimethoprim (BACTRIM DS,SEPTRA DS) 800-160 MG per tablet 1 tablet     1 tablet Oral Once per day on Mon Wed Fri 12/31/17 1003     01/01/18 0930  fluconazole (DIFLUCAN) IVPB 400 mg  Status:  Discontinued     400 mg 100 mL/hr over 120 Minutes Intravenous Every 24 hours 01/01/18 0830 01/02/18 1525   12/31/17 2200  vancomycin (VANCOCIN) IVPB 1000 mg/200 mL premix  Status:  Discontinued     1,000 mg 200 mL/hr over 60 Minutes Intravenous Every 8 hours 12/31/17 2048 01/02/18 1025   12/31/17 1400  cefTAZidime (FORTAZ) 2 g in dextrose 5 % 50 mL IVPB  Status:  Discontinued     2 g 100 mL/hr over 30 Minutes Intravenous Every 8 hours 12/31/17 1119 01/01/18 0808   12/31/17 1200  dolutegravir (TIVICAY) tablet 50 mg     50 mg Oral Daily 12/31/17 0207     12/31/17 1200  emtricitabine-rilpivir-tenofovir AF (ODEFSEY) 200-25-25 MG per tablet 1 tablet     1 tablet Oral Daily with breakfast 12/31/17 0207     12/31/17 1100  fluconazole (DIFLUCAN) tablet 400 mg  Status:  Discontinued     400 mg Oral Daily 12/31/17 1003 01/01/18 0829   12/31/17 1000  vancomycin (VANCOCIN) IVPB 1000 mg/200 mL premix  Status:  Discontinued     1,000 mg 200 mL/hr over 60 Minutes Intravenous 2 times daily 12/31/17 0444 12/31/17 2048   12/31/17 0215  sulfamethoxazole-trimethoprim (BACTRIM DS,SEPTRA DS) 800-160 MG per tablet 1 tablet  Status:  Discontinued     1 tablet Oral 2 times daily 12/31/17 0207 12/31/17 1003   12/31/17 0215  cefTRIAXone (ROCEPHIN) 2 g in dextrose 5 % 50 mL IVPB  Status:  Discontinued     2 g 100 mL/hr over 30 Minutes Intravenous  Every 24 hours 12/31/17 0207 12/31/17 1119   12/30/17 2345  vancomycin (VANCOCIN) 1,500 mg in sodium chloride 0.9 % 500 mL IVPB     1,500 mg 250 mL/hr over 120 Minutes Intravenous  Once 12/30/17 2331 12/31/17 0324   12/30/17 2330  cefTRIAXone (ROCEPHIN) 2 g in dextrose 5 % 50 mL IVPB     2 g 100 mL/hr over 30 Minutes Intravenous  Once 12/30/17 2328 12/31/17 0122        HPI/Subjective: He report BM yesterday. He denies abdominal pain, denies vomiting.    Objective: Vitals:   01/15/18 0600 01/15/18 1014  BP: (!) 99/57 97/61  Pulse: 66 76  Resp: 17 20  Temp: 98.3 F (36.8 C) 98 F (36.7 C)  SpO2: 98% 100%    Intake/Output Summary (Last 24 hours) at 01/15/2018 1417 Last data  filed at 01/15/2018 1014 Gross per 24 hour  Intake 833.08 ml  Output 2350 ml  Net -1516.92 ml   Filed Weights   12/30/17 1808 12/31/17 0758  Weight: 81.6 kg (180 lb) 78.8 kg (173 lb 11.6 oz)    Exam:   General: NAD  Cardiovascular; S 1, S 2 RRR  Respiratory: CTA  Abdomen: BS present , soft, nt  Musculoskeletal: No edema,   Data Reviewed: Basic Metabolic Panel: Recent Labs  Lab 01/09/18 0712 01/10/18 1056 01/11/18 0416 01/12/18 0330 01/13/18 0528 01/14/18 0406 01/15/18 1013  NA 136 136 137 133* 133* 134* 134*  K 3.3* 3.7 4.1 4.5 4.4 4.3 4.3  CL 104 104 103 98* 100* 101 100*  CO2 24 23 24 23 23 22 23   GLUCOSE 119* 93 96 93 105* 105* 109*  BUN <5* <5* <5* 9 13 17 16   CREATININE 0.85 0.48* 0.90 0.80 0.94 0.97 0.90  CALCIUM 8.4* 8.6* 8.9 9.1 9.3 9.3 9.0  MG 1.6* 1.8 2.1 1.9 2.1 2.0  --   PHOS 3.1 3.7 4.2  --  4.7* 4.2  --    Liver Function Tests: Recent Labs  Lab 01/09/18 0712 01/10/18 1056 01/14/18 0406  AST 31 32 46*  ALT 18 15* 27  ALKPHOS 69 74 144*  BILITOT 1.7* 1.4* 1.1  PROT 7.0 7.4 8.6*  ALBUMIN 2.3* 2.6* 3.1*   No results for input(s): LIPASE, AMYLASE in the last 168 hours. No results for input(s): AMMONIA in the last 168 hours. CBC: Recent Labs  Lab  01/09/18 0712 01/10/18 1043 01/11/18 0416 01/13/18 0528 01/14/18 0406 01/15/18 1013  WBC 17.8* 22.8* 21.5* 17.3* 15.6* 13.0*  NEUTROABS 14.3*  --   --   --  12.6*  --   HGB 10.3* 10.3* 10.7* 10.8* 10.6* 10.1*  HCT 31.7* 31.7* 32.4* 33.3* 31.4* 31.8*  MCV 95.8 96.9 97.3 96.5 96.6 97.5  PLT 451* 424* 423* 427* 390 374   Cardiac Enzymes: No results for input(s): CKTOTAL, CKMB, CKMBINDEX, TROPONINI in the last 168 hours. BNP (last 3 results) No results for input(s): BNP in the last 8760 hours.  ProBNP (last 3 results) No results for input(s): PROBNP in the last 8760 hours.  CBG: Recent Labs  Lab 01/14/18 0023 01/14/18 0622 01/14/18 1056 01/14/18 2002 01/15/18 0827  GLUCAP 110* 76 116* 100* 116*    Recent Results (from the past 240 hour(s))  C difficile quick scan w PCR reflex     Status: None   Collection Time: 01/09/18  9:36 AM  Result Value Ref Range Status   C Diff antigen NEGATIVE NEGATIVE Final   C Diff toxin NEGATIVE NEGATIVE Final   C Diff interpretation No C. difficile detected.  Final     Studies: No results found.  Scheduled Meds: . chlorhexidine  15 mL Mouth Rinse BID  . Chlorhexidine Gluconate Cloth  6 each Topical Daily  . dolutegravir  50 mg Oral Daily  . emtricitabine-rilpivir-tenofovir AF  1 tablet Oral Q breakfast  . fluconazole  200 mg Oral Daily  . mouth rinse  15 mL Mouth Rinse q12n4p  . metoCLOPramide (REGLAN) injection  10 mg Intravenous Q6H  . metroNIDAZOLE  500 mg Oral Q8H  . nicotine  14 mg Transdermal Daily  . sodium chloride flush  10-40 mL Intracatheter Q12H  . sulfamethoxazole-trimethoprim  1 tablet Oral Once per day on Mon Wed Fri   Continuous Infusions: . 0.9 % NaCl with KCl 20 mEq / L 10 mL/hr at  01/11/18 1723  . cefTRIAXone (ROCEPHIN)  IV Stopped (01/14/18 1635)  . levETIRAcetam 1,000 mg (01/15/18 1101)  . TPN ADULT (ION) 85 mL/hr at 01/14/18 1703  . 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 If 7PM-7AM, please contact night-coverage at [email or web address], password [email or web address] 01/15/2018, 2:17 PM  LOS: 15 days

## 2018-01-15 NOTE — Progress Notes (Signed)
Physical Therapy Treatment Patient Details Name: Justin Ball R Irby MRN: [id number] DOB: 09/18/1991 Today's Date: 01/15/2018    History of Present Illness 27 y.o. 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 Pt is showing improved stability with gait, plan to assess gait w/o AD nest session. Pt was min guard for stairs with cueing provided for technique. Pt is slightly impulsive and seems unaware of deficits. He would benefit from continued skilled PT acutely to maximize safety with mobility and functional independence.    Follow Up Recommendations  No PT follow up;Supervision/Assistance - 24 hour     Equipment Recommendations  None recommended by PT    Recommendations for Other Services       Precautions / Restrictions Precautions Precautions: Fall Precaution Comments: suprapubic catheter  Restrictions Weight Bearing Restrictions: No    Mobility  Bed Mobility Overal bed mobility: Needs Assistance Bed Mobility: Supine to Sit     Supine to sit: Supervision;HOB elevated     General bed mobility comments: Pt able to elevate trunk and progress hips forward to sit EOB. No assist needed. Supervision for safety  Transfers Overall transfer level: Needs assistance Equipment used: Rolling walker (2 wheeled) Transfers: Sit to/from Stand Sit to Stand: Min guard         General transfer comment: min guard for safety. No assist needed  Ambulation/Gait Ambulation/Gait assistance: Min guard Ambulation Distance (Feet): 300 Feet Assistive device: Rolling walker (2 wheeled) Gait Pattern/deviations: Step-through pattern;Decreased stride length;Narrow base of support;Drifts right/left Gait velocity: slow Gait velocity  interpretation: Below normal speed for age/gender General Gait Details: Min guard for safety. Pt with slow steady gait that improved with distance. Pt with narrow BOS, but states this is his baseline.    Stairs Stairs: Yes   Stair Management: One rail Right;Step to pattern;Forwards Number of Stairs: 3 General stair comments: Pt slightly impulsive with stairs, attempting to put the RW on the steps. Min guard and cueing for technique provided.  Wheelchair Mobility    Modified Rankin (Stroke Patients Only)       Balance Overall balance assessment: Needs assistance Sitting-balance support: Feet unsupported Sitting balance-Leahy Scale: Fair     Standing balance support: No upper extremity supported Standing balance-Leahy Scale: Fair                              Cognition Arousal/Alertness: Awake/alert Behavior During Therapy: Flat affect Overall Cognitive Status: Impaired/Different from baseline Area of Impairment: Problem solving                     Memory: Decreased short-term memory Following Commands: Follows multi-step commands with increased time Safety/Judgement: Decreased awareness of safety;Decreased awareness of deficits   Problem Solving: Slow processing;Decreased initiation;Requires verbal cues;Difficulty sequencing;Requires tactile cues General Comments: Pt stated he thought he was in [employer] when therapy first arrived, however when asked he was A&O x4. Signs of decreased short term memory, asking questions that had already been answered.      Exercises      General Comments        Pertinent Vitals/Pain Pain Assessment: No/denies pain Pain Intervention(s): Monitored during session    Home Living  Prior Function            PT Goals (current goals can now be found in the care plan section) Acute Rehab PT Goals Patient Stated Goal: pt did not state.  Grandmother hopes he will return home at his  baseline  PT Goal Formulation: With patient/family Time For Goal Achievement: 01/21/18 Potential to Achieve Goals: Good Progress towards PT goals: Progressing toward goals    Frequency    Min 3X/week      PT Plan Current plan remains appropriate    Co-evaluation              AM-PAC PT "6 Clicks" Daily Activity  Outcome Measure  Difficulty turning over in bed (including adjusting bedclothes, sheets and blankets)?: A Little Difficulty moving from lying on back to sitting on the side of the bed? : A Little Difficulty sitting down on and standing up from a chair with arms (e.g., wheelchair, bedside commode, etc,.)?: Unable Help needed moving to and from a bed to chair (including a wheelchair)?: A Little Help needed walking in hospital room?: A Little Help needed climbing 3-5 steps with a railing? : A Little 6 Click Score: 16    End of Session Equipment Utilized During Treatment: Gait belt Activity Tolerance: Patient tolerated treatment well Patient left: in chair;with call bell/phone within reach;with chair alarm set Nurse Communication: Mobility status PT Visit Diagnosis: Other abnormalities of gait and mobility (R26.89)     Time: 4540-9811 PT Time Calculation (min) (ACUTE ONLY): 21 min  Charges:  $Gait Training: 8-22 mins                    G Codes:       Kallie Locks, [place] Pager [phone] Acute Rehab   Sheral Apley 01/15/2018, 1:52 PM

## 2018-01-15 NOTE — Progress Notes (Signed)
PHARMACY - ADULT TOTAL PARENTERAL NUTRITION CONSULT NOTE   Pharmacy Consult:  TPN Indication:  Ileus  Patient Measurements: Height: '6\' 1"'  (185.4 cm) Weight: 173 lb 11.6 oz (78.8 kg) IBW/kg (Calculated) : 79.9 TPN AdjBW (KG): 78.8 Body mass index is 22.92 kg/m.  Assessment:  42 YOM with history of neurogenic bowel presented on 12/31/17 with fever and rash.  He was also having nausea and vomiting with hypoactive bowel sounds and distended abdomen on 01/01/18.  Diagnosed with ileus secondary to infected VP shunt.  Patient tolerated intermittent clamping of NG tube and consumed some clear liquids.  On 01/08/18, he vomited a large amount of green/clear bile.  Pharmacy consulted to manage TPN.  Patient reports no recent weight loss/gain and he was eating regularly PTA.  GI: prealbumin 8 >> 23.3, NGT came out 1/18, stool O/P 80m. 1/16 CT showed SBO - Reglan IV.   Endo: no hx DM - CBGs low normal.  SSI d/c'ed 01/14/18  Lytes: 1/21 labs - Na improves to 134, others WNL Renal: neurogenic bladder - SCr 0.97 stable, BUN WNL.  UOP 0.7 ml/kg/hr, NS 20K at 10 ml/hr, net -13.3L Pulm: stable on RA Cards: VSS Hepatobil: Hep B - AST/alk phos mildly elevated, others WNL.  TG high normal (150) - watch Neuro: Bipolar / depression / CVA / seizure - Keppra IV, PRN APAP/morphine/Seroquel ID: s/p Vanc for CoNS bacteremia + VP shunt infxn.  Unasyn > CTX/Flagyl for cellulitis/abdominal abscess.  Afebrile, WBC down to 15.6 AIDS - dolutegravir, Odefsey, Septra/Fluc for prophylaxis Hx cryptococcal meningitis s/p VP shunt >> removed and EVD replaced 1/9 >> removed 1/15 TPN Access: PICC placed 01/08/18 TPN start date: 01/08/18  Nutritional Goals (per RD rec 1/17): 2200-2400 kCal and 110-125 gm protein per day  Current Nutrition:  TPN Full liquid diet   Plan:  Reduce TPN to 60 ml/hr (goal 85 ml/hr) as diet is being advanced TPN will provide 82g AA, 259g CHO and 36g ILE, which equals to 1570 kCal, meeting ~70% of  needs. Electrolytes in TPN: Na increased 1/21, no change K/Phos/Mag, Cl:Ac 1:2 Daily multivitamin and trace elements in TPN D/C SSI.  Continue CBG checks Q12H to monitor for hypoglycemia. F/U with diet advancement to wean TPN further   Dotty Gonzalo D. DMina Marble PharmD, BCPS Pager:  [phone]/22/2019, 10:22 AM

## 2018-01-15 NOTE — Progress Notes (Signed)
Occupational Therapy Treatment Patient Details Name: ENIO HORNBACK MRN: [id number] DOB: 03-27-1991 Today's Date: 01/15/2018    History of present illness 27 y.o. 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 Pt completed room level functional mobility at RW level with MinGuard assist throughout, requires verbal cues for sequencing RW use when navigating small bathroom space as well as for safety. Pt completed grooming ADLs with MinGuard for standing balance, min verbal cues for initiating grooming tasks. Feel POC remains appropriate at this time. Will continue to follow acutely to maximize [name] safety and independence with ADLs and mobility prior to discharge.   Follow Up Recommendations  Outpatient OT;Supervision/Assistance - 24 hour    Equipment Recommendations  None recommended by OT          Precautions / Restrictions Precautions Precautions: Fall Precaution Comments: suprapubic catheter  Restrictions Weight Bearing Restrictions: No       Mobility Bed Mobility Overal bed mobility: Needs Assistance Bed Mobility: Supine to Sit     Supine to sit: Supervision;HOB elevated     General bed mobility comments: OOB in recliner upon arrival   Transfers Overall transfer level: Needs assistance Equipment used: Rolling walker (2 wheeled) Transfers: Sit to/from Stand Sit to Stand: Min guard         General transfer comment: min guard for safety. No assist needed. Completed sit<>stand x2, from recliner and toilet    Balance Overall balance assessment: Needs assistance Sitting-balance support: Feet unsupported Sitting balance-Leahy Scale: Fair     Standing balance support: No upper extremity supported Standing balance-Leahy Scale: Fair Standing  balance comment: Pt completing standing grooming ADLs without UE support with close MinGuard                            ADL either performed or assessed with clinical judgement   ADL Overall ADL's : Needs assistance/impaired     Grooming: Wash/dry face;Oral care;Min guard;Standing Grooming Details (indicate cue type and reason): MinGuard for standing balance              Lower Body Dressing: Minimal assistance;Sit to/from stand Lower Body Dressing Details (indicate cue type and reason): reaching to adjust socks seated in recliner  Toilet Transfer: Min guard;Ambulation;Regular Toilet;Grab bars;RW Statistician Details (indicate cue type and reason): requires increased assist to maneuver RW into/out of bathroom, though overall MinGuard for transfer completion          Functional mobility during ADLs: Min guard;Rolling walker General ADL Comments: Pt completing functional mobility within room, toilet transfer and standing grooming ADLs; pt requires increased assist (verbal cues) to sequence use of RW and to complete toilet transfer as Pt attempting to pick up and carry RW vs rolling it                       Cognition Arousal/Alertness: Awake/alert Behavior During Therapy: Flat affect Overall Cognitive Status: Impaired/Different from baseline Area of Impairment: Problem solving                   Current Attention Level: Selective Memory: Decreased short-term memory Following Commands: Follows one step commands with increased time;Follows multi-step commands inconsistently Safety/Judgement: Decreased awareness of safety;Decreased awareness of deficits   Problem Solving: Slow processing;Decreased  initiation;Requires verbal cues;Difficulty sequencing;Requires tactile cues General Comments: Pt requires verbal cues for safety and repetition to complete multi-step directions, requires cues for initiating completion of grooming tasks                            Pertinent Vitals/ Pain       Pain Assessment: No/denies pain Pain Intervention(s): Monitored during session                                                          Frequency  Min 2X/week        Progress Toward Goals  OT Goals(current goals can now be found in the care plan section)  Progress towards OT goals: Progressing toward goals  Acute Rehab OT Goals Patient Stated Goal: to return home  OT Goal Formulation: With patient/family Potential to Achieve Goals: Good  Plan Discharge plan remains appropriate                    AM-PAC PT "6 Clicks" Daily Activity     Outcome Measure   Help from another person eating meals?: A Little Help from another person taking care of personal grooming?: A Little Help from another person toileting, which includes using toliet, bedpan, or urinal?: A Little Help from another person bathing (including washing, rinsing, drying)?: A Lot Help from another person to put on and taking off regular upper body clothing?: A Little Help from another person to put on and taking off regular lower body clothing?: A Lot 6 Click Score: 16    End of Session Equipment Utilized During Treatment: Rolling walker;Gait belt  OT Visit Diagnosis: Unsteadiness on feet (R26.81);Cognitive communication deficit (R41.841)   Activity Tolerance Patient tolerated treatment well   Patient Left in chair;with call bell/phone within reach;with chair alarm set   Nurse Communication Mobility status        Time: 1420-1441 OT Time Calculation (min): 21 min  Charges: OT General Charges $OT Visit: 1 Visit OT Treatments $Self Care/Home Management : 8-22 mins  Marcy SirenBreanna Zane Pellecchia, OT Pager [phone] 01/15/2018   Orlando PennerBreanna L Shaquinta Peruski 01/15/2018, 2:52 PM

## 2018-01-16 DIAGNOSIS — R188 Other ascites: Secondary | ICD-10-CM

## 2018-01-16 DIAGNOSIS — F3170 Bipolar disorder, currently in remission, most recent episode unspecified: Secondary | ICD-10-CM

## 2018-01-16 LAB — GLUCOSE, CAPILLARY
GLUCOSE-CAPILLARY: 96 mg/dL (ref 65–99)
Glucose-Capillary: 110 mg/dL — ABNORMAL HIGH (ref 65–99)

## 2018-01-16 LAB — BASIC METABOLIC PANEL
Anion gap: 10 (ref 5–15)
BUN: 13 mg/dL (ref 6–20)
CALCIUM: 9.1 mg/dL (ref 8.9–10.3)
CO2: 25 mmol/L (ref 22–32)
CREATININE: 0.9 mg/dL (ref 0.61–1.24)
Chloride: 100 mmol/L — ABNORMAL LOW (ref 101–111)
GFR calc Af Amer: >60 mL/min (ref >60)
GLUCOSE: 108 mg/dL — AB (ref 65–99)
Potassium: 4 mmol/L (ref 3.5–5.1)
Sodium: 135 mmol/L (ref 135–145)

## 2018-01-16 MED ORDER — AMOXICILLIN-POT CLAVULANATE 875-125 MG PO TABS
1.0000 | ORAL_TABLET | Freq: Two times a day (BID) | ORAL | Status: DC
  Administered 2018-01-16 – 2018-01-17 (×3): 1 via ORAL
  Filled 2018-01-16 (×3): qty 1

## 2018-01-16 MED ORDER — TRAVASOL 10 % IV SOLN
INTRAVENOUS | Status: DC
  Administered 2018-01-16: 17:00:00 via INTRAVENOUS
  Filled 2018-01-16: qty 1162.8

## 2018-01-16 NOTE — Progress Notes (Signed)
15 Days Post-Op   Subjective/Chief Complaint: Tol FLD    Objective: Vital signs in last 24 hours: Temp:  [97.8 F (36.6 C)-98.6 F (37 C)] 98.2 F (36.8 C) (01/23 0513) Pulse Rate:  [72-85] 72 (01/23 0513) Resp:  [16-20] 20 (01/23 0513) BP: (95-110)/(57-70) 99/57 (01/23 0513) SpO2:  [96 %-100 %] 99 % (01/23 0513) Weight:  [81.4 kg (179 lb 7.3 oz)] 81.4 kg (179 lb 7.3 oz) (01/23 0513) Last BM Date: 01/13/18  Intake/Output from previous day: 01/22 0701 - 01/23 0700 In: 987 [I.V.:567; IV Piggyback:420] Out: 2440 [Urine:2200; Emesis/NG output:240] Intake/Output this shift: No intake/output data recorded.  General appearance: alert and cooperative GI: soft, non-tender; bowel sounds normal; no masses,  no organomegaly  Lab Results:  Recent Labs    01/14/18 0406 01/15/18 1013  WBC 15.6* 13.0*  HGB 10.6* 10.1*  HCT 31.4* 31.8*  PLT 390 374   BMET Recent Labs    01/15/18 1013 01/16/18 0523  NA 134* 135  K 4.3 4.0  CL 100* 100*  CO2 23 25  GLUCOSE 109* 108*  BUN 16 13  CREATININE 0.90 0.90  CALCIUM 9.0 9.1   PT/INR No results for input(s): LABPROT, INR in the last 72 hours. ABG No results for input(s): PHART, HCO3 in the last 72 hours.  Invalid input(s): PCO2, PO2  Studies/Results: No results found.  Anti-infectives: Anti-infectives (From admission, onward)   Start     Dose/Rate Route Frequency Ordered Stop   01/15/18 1800  metroNIDAZOLE (FLAGYL) IVPB 500 mg     500 mg 100 mL/hr over 60 Minutes Intravenous Every 8 hours 01/15/18 1620     01/15/18 1400  metroNIDAZOLE (FLAGYL) tablet 500 mg  Status:  Discontinued     500 mg Oral Every 8 hours 01/15/18 1017 01/15/18 1620   01/11/18 1600  cefTRIAXone (ROCEPHIN) 2 g in dextrose 5 % 50 mL IVPB     2 g 100 mL/hr over 30 Minutes Intravenous Every 24 hours 01/11/18 1435     01/11/18 1500  metroNIDAZOLE (FLAGYL) IVPB 500 mg  Status:  Discontinued     500 mg 100 mL/hr over 60 Minutes Intravenous Every 8 hours  01/11/18 1435 01/15/18 1017   01/09/18 1800  Ampicillin-Sulbactam (UNASYN) 3 g in sodium chloride 0.9 % 100 mL IVPB  Status:  Discontinued     3 g 200 mL/hr over 30 Minutes Intravenous Every 6 hours 01/09/18 1730 01/11/18 1435   01/07/18 1314  vancomycin (VANCOCIN) IVPB 750 mg/150 ml premix  Status:  Discontinued     750 mg 150 mL/hr over 60 Minutes Intravenous Every 8 hours 01/07/18 1314 01/10/18 1051   01/05/18 0500  vancomycin (VANCOCIN) IVPB 1000 mg/200 mL premix  Status:  Discontinued     1,000 mg 200 mL/hr over 60 Minutes Intravenous Every 8 hours 01/04/18 1349 01/07/18 1314   01/04/18 1000  fluconazole (DIFLUCAN) tablet 200 mg     200 mg Oral Daily 01/04/18 0844     01/03/18 1000  fluconazole (DIFLUCAN) IVPB 200 mg  Status:  Discontinued     200 mg 100 mL/hr over 60 Minutes Intravenous Every 24 hours 01/02/18 1525 01/04/18 0844   01/02/18 1100  vancomycin (VANCOCIN) 1,250 mg in sodium chloride 0.9 % 250 mL IVPB  Status:  Discontinued     1,250 mg 166.7 mL/hr over 90 Minutes Intravenous Every 8 hours 01/02/18 1025 01/04/18 1137   01/02/18 0900  sulfamethoxazole-trimethoprim (BACTRIM DS,SEPTRA DS) 800-160 MG per tablet 1 tablet  1 tablet Oral Once per day on Mon Wed Fri 12/31/17 1003     01/01/18 0930  fluconazole (DIFLUCAN) IVPB 400 mg  Status:  Discontinued     400 mg 100 mL/hr over 120 Minutes Intravenous Every 24 hours 01/01/18 0830 01/02/18 1525   12/31/17 2200  vancomycin (VANCOCIN) IVPB 1000 mg/200 mL premix  Status:  Discontinued     1,000 mg 200 mL/hr over 60 Minutes Intravenous Every 8 hours 12/31/17 2048 01/02/18 1025   12/31/17 1400  cefTAZidime (FORTAZ) 2 g in dextrose 5 % 50 mL IVPB  Status:  Discontinued     2 g 100 mL/hr over 30 Minutes Intravenous Every 8 hours 12/31/17 1119 01/01/18 0808   12/31/17 1200  dolutegravir (TIVICAY) tablet 50 mg     50 mg Oral Daily 12/31/17 0207     12/31/17 1200  emtricitabine-rilpivir-tenofovir AF (ODEFSEY) 200-25-25 MG per  tablet 1 tablet     1 tablet Oral Daily with breakfast 12/31/17 0207     12/31/17 1100  fluconazole (DIFLUCAN) tablet 400 mg  Status:  Discontinued     400 mg Oral Daily 12/31/17 1003 01/01/18 0829   12/31/17 1000  vancomycin (VANCOCIN) IVPB 1000 mg/200 mL premix  Status:  Discontinued     1,000 mg 200 mL/hr over 60 Minutes Intravenous 2 times daily 12/31/17 0444 12/31/17 2048   12/31/17 0215  sulfamethoxazole-trimethoprim (BACTRIM DS,SEPTRA DS) 800-160 MG per tablet 1 tablet  Status:  Discontinued     1 tablet Oral 2 times daily 12/31/17 0207 12/31/17 1003   12/31/17 0215  cefTRIAXone (ROCEPHIN) 2 g in dextrose 5 % 50 mL IVPB  Status:  Discontinued     2 g 100 mL/hr over 30 Minutes Intravenous Every 24 hours 12/31/17 0207 12/31/17 1119   12/30/17 2345  vancomycin (VANCOCIN) 1,500 mg in sodium chloride 0.9 % 500 mL IVPB     1,500 mg 250 mL/hr over 120 Minutes Intravenous  Once 12/30/17 2331 12/31/17 0324   12/30/17 2330  cefTRIAXone (ROCEPHIN) 2 g in dextrose 5 % 50 mL IVPB     2 g 100 mL/hr over 30 Minutes Intravenous  Once 12/30/17 2328 12/31/17 0122      Assessment/Plan: s/p Procedure(s): REMOVAL OF V-P SHUNT, PLACEMENT OF EVD (N/A) Advance diet to soft and then can adv as tol Dulcolax PO Mobilize as tol Call with questions  LOS: 16 days    Marigene Ehlersamirez Jr., Jed Limerickrmando 01/16/2018

## 2018-01-16 NOTE — Progress Notes (Signed)
PROGRESS NOTE  Justin Ball UXL:[id number] DOB: 1991/03/02 DOA: 27/05/2018 PCP: No primary care provider on file.  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 Patient was seen by the PCPseveral days prior to admission with history ofrash along his abd wall where his VP shunt was.Patientwas initially placed on bactrim. The rash got worse and was tracking along the VP shunt location(under his skin along his chest wall). Patient was admitted with sepsis, and VP shunt infection.Patient has undergoneVP shunt removal and placement of EVD (on 01/01/18)by neurosurgery. Patient subsequently developed ileus and multiple abdominal abscess secondary to VP shunt infection. He was also treated for Staph epi Bacteremia with vancomycin  Today, pt reported feeling better, denies any new symptoms, denies abdominal pain, fever/chills, nausea/vomiting, chest pain, SOB. 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 CSF culture: NGTD. Culture VP shunt from brain grew Haemophilus, completed IV Vancomycin Cont PO Augmentin for another 2 weeks, need follow up CT abdomen per ID Follow up with ID outpt  Ileus/SBO/Multiples intra-abdominal  abscess General surgery/ ID following CT abdomen with intra-abdominal abscess IR consulted didn't recommended drainage placement Continue with TPN, plan to d/c once able to tolerate regular diet, currently on soft diet ID recommendation as mentioned above  AIDS Continue with Cyndi Bender ID following. On Fluconazole for history of cryptococcus Meningitis, bactrim for PJP ppx  Seizure Continue with Keppra  S/P VP Shunt Neurosurgery consulted, pt had VP shunt for cryptococcus meningitis in the past  VP shunt has been removed by neurosurgery. EVD discontinued on 1/15 CT head stable per neurosurgery, no further work up  Bipolar On Seroquel     Code Status: Full  Family Communication: None at bedside  Disposition Plan: Home once stable, likely 01/17/18   Consultants:  ID  Gen surg  Neurosurgery  Procedures:  VP shunt removal, EVD  Antimicrobials:  Currently on PO Augmentin, fluconazole, bactrim  Was on metronidazole, ceftriaxone, Unasyn, Vancomycin  DVT prophylaxis:  SCDs   Objective: Vitals:   01/16/18 0043 01/16/18 0513 01/16/18 0920 01/16/18 1337  BP: 108/64 (!) 99/57 (!) 96/59 103/76  Pulse: 81 72 74 80  Resp: 18 20 18 19   Temp: 98.4 F (36.9 C) 98.2 F (36.8 C) 98.4 F (36.9 C) 98.6 F (37 C)  TempSrc: Oral Oral Oral Oral  SpO2: 100% 99% 99% 100%  Weight:  81.4 kg (179 lb 7.3 oz)    Height:        Intake/Output Summary (Last 24 hours) at 01/16/2018 1518 Last data filed at 01/16/2018 1352 Gross per 24 hour  Intake 1117 ml  Output 2140 ml  Net -1023 ml   Filed Weights   12/30/17 1808 12/31/17 0758 01/16/18 0513  Weight: 81.6 kg (180 lb) 78.8 kg (173 lb  11.6 oz) 81.4 kg (179 lb 7.3 oz)    Exam:   General:  Alert, awake, oriented, NAD  Cardiovascular: S1, S2 present, no added hrt sound  Respiratory: Chest clear B/L  Abdomen: Soft, non-tender, non-distended, BS present  Musculoskeletal: No pedal edema bilaterally  Skin:  Normal   Psychiatry: Normal mood   Data Reviewed: CBC: Recent Labs  Lab 01/10/18 1043 01/11/18 0416 01/13/18 0528 01/14/18 0406 01/15/18 1013  WBC 22.8* 21.5* 17.3* 15.6* 13.0*  NEUTROABS  --   --   --  12.6*  --   HGB 10.3* 10.7* 10.8* 10.6* 10.1*  HCT 31.7* 32.4* 33.3* 31.4* 31.8*  MCV 96.9 97.3 96.5 96.6 97.5  PLT 424* 423* 427* 390 374   Basic Metabolic Panel: Recent Labs  Lab 01/10/18 1056 01/11/18 0416 01/12/18 0330 01/13/18 0528 01/14/18 0406 01/15/18 1013 01/16/18 0523  NA 136 137 133* 133* 134* 134* 135  K 3.7 4.1 4.5 4.4 4.3 4.3 4.0  CL 104 103 98* 100* 101 100* 100*  CO2 23 24 23 23 22 23 25   GLUCOSE 93 96 93 105* 105* 109* 108*  BUN <5* <5* 9 13 17 16 13   CREATININE 0.48* 0.90 0.80 0.94 0.97 0.90 0.90  CALCIUM 8.6* 8.9 9.1 9.3 9.3 9.0 9.1  MG 1.8 2.1 1.9 2.1 2.0  --   --   PHOS 3.7 4.2  --  4.7* 4.2  --   --    GFR: Estimated Creatinine Clearance: 140.6 mL/min (by C-G formula based on SCr of 0.9 mg/dL). Liver Function Tests: Recent Labs  Lab 01/10/18 1056 01/14/18 0406  AST 32 46*  ALT 15* 27  ALKPHOS 74 144*  BILITOT 1.4* 1.1  PROT 7.4 8.6*  ALBUMIN 2.6* 3.1*   No results for input(s): LIPASE, AMYLASE in the last 168 hours. No results for input(s): AMMONIA in the last 168 hours. Coagulation Profile: No results for input(s): INR, PROTIME in the last 168 hours. Cardiac Enzymes: No results for input(s): CKTOTAL, CKMB, CKMBINDEX, TROPONINI in the last 168 hours. BNP (last 3 results) No results for input(s): PROBNP in the last 8760 hours. HbA1C: No results for input(s): HGBA1C in the last 72 hours. CBG: Recent Labs  Lab 01/14/18 1056 01/14/18 2002 01/15/18 0827 01/15/18 1936 01/16/18 0818  GLUCAP 116* 100* 116* 104* 96   Lipid Profile: Recent Labs    01/14/18 0406  TRIG 150*   Thyroid Function Tests: No results for input(s): TSH, T4TOTAL, FREET4, T3FREE, THYROIDAB in the last 72 hours. Anemia Panel: No results for input(s):  VITAMINB12, FOLATE, FERRITIN, TIBC, IRON, RETICCTPCT in the last 72 hours. Urine analysis:    Component Value Date/Time   COLORURINE AMBER (A) 12/30/2017 2119   APPEARANCEUR HAZY (A) 12/30/2017 2119   LABSPEC 1.028 12/30/2017 2119   PHURINE 5.0 12/30/2017 2119   GLUCOSEU NEGATIVE 12/30/2017 2119   HGBUR SMALL (A) 12/30/2017 2119   BILIRUBINUR SMALL (A) 12/30/2017 2119   KETONESUR NEGATIVE 12/30/2017 2119   PROTEINUR 100 (A) 12/30/2017 2119   UROBILINOGEN 4.0 (H) 09/09/2014 1055   NITRITE NEGATIVE 12/30/2017 2119   LEUKOCYTESUR MODERATE (A) 12/30/2017 2119   Sepsis Labs: @LABRCNTIP (procalcitonin:4,lacticidven:4)  ) Recent Results (from the past 240 hour(s))  C difficile quick scan w PCR reflex     Status: None   Collection Time: 01/09/18  9:36 AM  Result Value Ref Range Status   C Diff antigen NEGATIVE NEGATIVE Final   C Diff toxin NEGATIVE NEGATIVE Final   C Diff interpretation No  C. difficile detected.  Final      Studies: No results found.  Scheduled Meds: . amoxicillin-clavulanate  1 tablet Oral Q12H  . chlorhexidine  15 mL Mouth Rinse BID  . Chlorhexidine Gluconate Cloth  6 each Topical Daily  . dolutegravir  50 mg Oral Daily  . emtricitabine-rilpivir-tenofovir AF  1 tablet Oral Q breakfast  . fluconazole  200 mg Oral Daily  . mouth rinse  15 mL Mouth Rinse q12n4p  . metoCLOPramide (REGLAN) injection  10 mg Intravenous Q6H  . nicotine  14 mg Transdermal Daily  . sodium chloride flush  10-40 mL Intracatheter Q12H  . sulfamethoxazole-trimethoprim  1 tablet Oral Once per day on Mon Wed Fri    Continuous Infusions: . 0.9 % NaCl with KCl 20 mEq / L 10 mL/hr at 01/11/18 1723  . levETIRAcetam 1,000 mg (01/16/18 1033)  . TPN ADULT (ION) 60 mL/hr at 01/15/18 1733  . TPN ADULT (ION)       LOS: 16 days     Briant Cedar, MD [facility]   If 7PM-7AM, please contact night-coverage [email or web address] Password [facility] 01/16/2018, 3:18 PM

## 2018-01-16 NOTE — Progress Notes (Signed)
Pt had episode of emesis after advance to full liquid diet. MD notified, verbal orders given for NPO

## 2018-01-16 NOTE — Progress Notes (Signed)
PHARMACY - ADULT TOTAL PARENTERAL NUTRITION CONSULT NOTE   Pharmacy Consult:  TPN Indication:  Ileus  Patient Measurements: Height: '6\' 1"'  (185.4 cm) Weight: 179 lb 7.3 oz (81.4 kg) IBW/kg (Calculated) : 79.9 TPN AdjBW (KG): 78.8 Body mass index is 23.68 kg/m.  Assessment:  16 YOM with history of neurogenic bowel presented on 12/31/17 with fever and rash.  He was also having nausea and vomiting with hypoactive bowel sounds and distended abdomen on 01/01/18.  Diagnosed with ileus secondary to infected VP shunt.  Patient tolerated intermittent clamping of NG tube and consumed some clear liquids.  On 01/08/18, he vomited a large amount of green/clear bile.  Pharmacy consulted to manage TPN.  Patient reports no recent weight loss/gain and he was eating regularly PTA.  GI: prealbumin 8 >> 23.3, emesis 256m. 1/16 CT showed SBO - Reglan IV, PRN Zofran *advanced to full liquid on 1/22, vomited then back to NPO that evening Endo: no hx DM - CBGs low normal.  SSI d/c'ed 01/14/18  Lytes: slightly low CL, others WNL Renal: neurogenic bladder - SCr 0.9 stable, BUN WNL.  UOP 1.1 ml/kg/hr, NS 20K at 10 ml/hr, net -15L Pulm: stable on RA Cards: VSS Hepatobil: Hep B - AST/alk phos mildly elevated, others WNL.  TG high normal (150) - watch Neuro: Bipolar / depression / CVA / seizure - Keppra IV, PRN APAP/morphine/Seroquel ID: s/p Vanc for CoNS bacteremia + VP shunt infxn.  Unasyn > CTX/Flagyl for cellulitis/abdominal abscess.  Afebrile, WBC down to 13 AIDS - dolutegravir, Odefsey, Septra/Fluc for prophylaxis Hx cryptococcal meningitis s/p VP shunt >> removed and EVD replaced 1/9 >> removed 1/15 TPN Access: PICC placed 01/08/18 TPN start date: 01/08/18  Nutritional Goals (per RD rec 1/17): 2200-2400 kCal and 110-125 gm protein per day  Current Nutrition:  TPN   Plan:  Increase TPN back to goal at 85 ml/hr since now NPO TPN will provide 116g AA, 367g CHO and 51g ILE, which equals to 2224 kCal, meeting  100% of needs. Electrolytes in TPN: increase Na 1/23, no change K/Phos/Mag, change Cl:Ac 1:1 Daily multivitamin and trace elements in TPN D/C SSI.  Continue CBG checks Q12H to monitor for hypoglycemia. F/U AM labs    Rogina Schiano D. DMina Marble PharmD, BCPS Pager:  [phone]/23/2019, 7:48 AM

## 2018-01-16 NOTE — Progress Notes (Signed)
    [facility]   Reason for visit: Follow up on intraabdominal abscess  Interval History: no new issues, advancing diet, no associated rash.  No complaints  Physical Exam: Constitutional:  Vitals:   01/16/18 0513 01/16/18 0920  BP: (!) 99/57 (!) 96/59  Pulse: 72 74  Resp: 20 18  Temp: 98.2 F (36.8 C) 98.4 F (36.9 C)  SpO2: 99% 99%   patient appears in NAD Respiratory: Normal respiratory effort; CTA B Cardiovascular: RRR GI: soft, nt, nd Skin: no rashes  Review of Systems: Constitutional: negative for fevers and chills Respiratory: negative for cough  Lab Results  Component Value Date   WBC 13.0 (H) 01/15/2018   HGB 10.1 (L) 01/15/2018   HCT 31.8 (L) 01/15/2018   MCV 97.5 01/15/2018   PLT 374 01/15/2018    Lab Results  Component Value Date   CREATININE 0.90 01/16/2018   BUN 13 01/16/2018   NA 135 01/16/2018   K 4.0 01/16/2018   CL 100 (L) 01/16/2018   CO2 25 01/16/2018    Lab Results  Component Value Date   ALT 27 01/14/2018   AST 46 (H) 01/14/2018   ALKPHOS 144 (H) 01/14/2018     Microbiology: Recent Results (from the past 240 hour(s))  C difficile quick scan w PCR reflex     Status: None   Collection Time: 01/09/18  9:36 AM  Result Value Ref Range Status   C Diff antigen NEGATIVE NEGATIVE Final   C Diff toxin NEGATIVE NEGATIVE Final   C Diff interpretation No C. difficile detected.  Final    Impression/Plan:  1. Intra abdominal abscess - I will transition him to oral augmentin for another two weeks.  He will need follow up CT scan abdomen next week.  I will arrange follow up in our office.   Ok from ID standpoint for d/c.   2.  HIV - continuing on ARVs. 3. OI - on fluconazole for prophyaxis for Cryptococcal.  On Bactrim for PJP prophylaxis.

## 2018-01-17 ENCOUNTER — Other Ambulatory Visit: Payer: Self-pay | Admitting: Infectious Diseases

## 2018-01-17 ENCOUNTER — Encounter (HOSPITAL_COMMUNITY): Payer: Self-pay

## 2018-01-17 DIAGNOSIS — R1084 Generalized abdominal pain: Secondary | ICD-10-CM

## 2018-01-17 LAB — COMPREHENSIVE METABOLIC PANEL
ALT: 26 U/L (ref 17–63)
AST: 39 U/L (ref 15–41)
Albumin: 2.9 g/dL — ABNORMAL LOW (ref 3.5–5.0)
Alkaline Phosphatase: 111 U/L (ref 38–126)
Anion gap: 8 (ref 5–15)
BUN: 15 mg/dL (ref 6–20)
CHLORIDE: 102 mmol/L (ref 101–111)
CO2: 24 mmol/L (ref 22–32)
CREATININE: 0.93 mg/dL (ref 0.61–1.24)
Calcium: 9 mg/dL (ref 8.9–10.3)
Glucose, Bld: 84 mg/dL (ref 65–99)
Potassium: 4.2 mmol/L (ref 3.5–5.1)
SODIUM: 134 mmol/L — AB (ref 135–145)
Total Bilirubin: 0.9 mg/dL (ref 0.3–1.2)
Total Protein: 7.9 g/dL (ref 6.5–8.1)

## 2018-01-17 LAB — MAGNESIUM: MAGNESIUM: 1.7 mg/dL (ref 1.7–2.4)

## 2018-01-17 LAB — PHOSPHORUS: Phosphorus: 4 mg/dL (ref 2.5–4.6)

## 2018-01-17 LAB — GLUCOSE, CAPILLARY: Glucose-Capillary: 124 mg/dL — ABNORMAL HIGH (ref 65–99)

## 2018-01-17 LAB — TRIGLYCERIDES: TRIGLYCERIDES: 110 mg/dL (ref <150)

## 2018-01-17 MED ORDER — LEVETIRACETAM 500 MG PO TABS: 1000.0000 mg | ORAL_TABLET | Freq: Two times a day (BID) | ORAL | Status: DC

## 2018-01-17 MED ORDER — FLUCONAZOLE 200 MG PO TABS: 200.0000 mg | ORAL_TABLET | Freq: Every day | ORAL | 0 refills | Status: DC

## 2018-01-17 MED ORDER — SULFAMETHOXAZOLE-TRIMETHOPRIM 800-160 MG PO TABS: 1.0000 | ORAL_TABLET | ORAL | 0 refills | Status: DC

## 2018-01-17 MED ORDER — AMOXICILLIN-POT CLAVULANATE 875-125 MG PO TABS: 1.0000 | ORAL_TABLET | Freq: Two times a day (BID) | ORAL | 0 refills | Status: AC

## 2018-01-17 NOTE — Care Management Note (Signed)
Case Management Note  Patient Details  Name: Justin Ball MRN: [id number] Date of Birth: 05/19/1991  Subjective/Objective:                    Action/Plan: [facility] to change his suprapubic catheter. Pts [facility] and she would like to continue with this. Orders in Epic and information on the AVS. MD aware of above. CM notified Katrina with [facility] of patients d/c and continuation of RN at home.  Grandmother to provide transportation home today.   Expected Discharge Date:  01/17/18               Expected Discharge Plan:  Home w Home Health Services  In-House Referral:     Discharge planning Services  CM Consult  Post Acute Care Choice:  Home Health Choice offered to:  Patient(grandmother)  DME Arranged:    DME Agency:     HH Arranged:  RN HH Agency:  [facility]  Status of Service:  Completed, signed off  If discussed at Long Length of Stay Meetings, dates discussed:    Additional Comments:  Kermit BaloKelli F Errick Salts, RN 01/17/2018, 3:33 PM

## 2018-01-17 NOTE — Progress Notes (Signed)
Occupational Therapy Treatment Patient Details Name: RAFFAELE DERISE MRN: [id number] DOB: 1991/07/23 Today's Date: 01/17/2018    History of present illness 27 y.o. 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 He is overall Supervision-Min Assist for safety at this time   Follow Up Recommendations  Outpatient OT;Supervision/Assistance - 24 hour    Equipment Recommendations  None recommended by OT    Recommendations for Other Services      Precautions / Restrictions Precautions Precautions: Fall Precaution Comments: suprapubic catheter  Restrictions Weight Bearing Restrictions: No       Mobility Bed Mobility Overal bed mobility: Needs Assistance Bed Mobility: Supine to Sit Rolling: Modified independent (Device/Increase time)            Transfers Overall transfer level: Needs assistance Equipment used: Rolling walker (2 wheeled) Transfers: Sit to/from Stand Sit to Stand: Supervision Stand pivot transfers: Min guard       General transfer comment: min guard for safety during stnad pivot transfer and while standing at sink for grooming tasks. No assist needed.    Balance Overall balance assessment: Needs assistance Sitting-balance support: Feet unsupported Sitting balance-Leahy Scale: Fair Sitting balance - Comments: pt sitting EOB to don socks   Standing balance support: No upper extremity supported Standing balance-Leahy Scale: Fair Standing balance comment: Pt completing standing grooming ADLs without UE support with close supervision/Min guard assist                            ADL either performed or assessed with  clinical judgement   ADL Overall ADL's : Needs assistance/impaired Eating/Feeding: NPO   Grooming: Wash/dry face;Oral care;Standing;Supervision/safety Grooming Details (indicate cue type and reason): Supervision standing at sink for grooming. VC's for RW and hand placement         Upper Body Dressing : Minimal assistance Upper Body Dressing Details (indicate cue type and reason): Min A don/doff and change gown Lower Body Dressing: Supervision/safety Lower Body Dressing Details (indicate cue type and reason): Close supervision as pt sat EOB to don socks in sitting by crossing one foot over the other.   Toilet Transfer Details (indicate cue type and reason): Pt declined toileting         Functional mobility during ADLs: Supervision/safety;Min guard;Rolling walker;Cueing for sequencing;Cueing for safety(vc's for safety/sequencing, hand placement as pt attempted to carry RW) General ADL Comments: Pt seen for OT ADL retraining session today with focus on UB/LB dressing in sitting & sit to stand, bed mobility, grooming standing at sink. He is overall [employer], [employer] for safety at this time.     Vision       Perception     Praxis      Cognition Arousal/Alertness: Awake/alert Behavior During Therapy: Flat affect Overall Cognitive Status: Impaired/Different from baseline Area of Impairment: Problem solving;Safety/judgement                   Current Attention Level: Selective Memory: Decreased short-term memory Following Commands: Follows one step commands with increased time;Follows multi-step commands inconsistently Safety/Judgement: Decreased awareness of safety;Decreased awareness of deficits   Problem Solving: Slow processing;Decreased initiation;Requires verbal cues;Difficulty sequencing;Requires tactile  cues General Comments: Pt requires verbal cues for safety and repetition to complete multi-step directions, requires cues for initiating completion of  grooming tasks         Exercises     Shoulder Instructions       General Comments      Pertinent Vitals/ Pain       Pain Assessment: No/denies pain  Home Living                                          Prior Functioning/Environment              Frequency  Min 2X/week        Progress Toward Goals  OT Goals(current goals can now be found in the care plan section)  Progress towards OT goals: Progressing toward goals  Acute Rehab OT Goals Patient Stated Goal: to return home later today if able  Plan Discharge plan remains appropriate    Co-evaluation                 AM-PAC PT "6 Clicks" Daily Activity     Outcome Measure   Help from another person eating meals?: A Little Help from another person taking care of personal grooming?: A Little Help from another person toileting, which includes using toliet, bedpan, or urinal?: A Little Help from another person bathing (including washing, rinsing, drying)?: A Lot Help from another person to put on and taking off regular upper body clothing?: A Little Help from another person to put on and taking off regular lower body clothing?: A Lot 6 Click Score: 16    End of Session Equipment Utilized During Treatment: Gait belt;Rolling walker  OT Visit Diagnosis: Unsteadiness on feet (R26.81);Cognitive communication deficit (R41.841)   Activity Tolerance Patient tolerated treatment well   Patient Left in chair;with call bell/phone within reach;with chair alarm set   Nurse Communication Other (comment)(IV beeping)        Time: [id number] OT Time Calculation (min): 26 min  Charges: OT General Charges $OT Visit: 1 Visit OT Treatments $Self Care/Home Management : 23-37 mins    Verla Bryngelson Beth Dixon, OTR/L 01/17/2018, 9:56 AM

## 2018-01-17 NOTE — Discharge Summary (Signed)
Discharge Summary  Justin Ball XLK:[id number] DOB: 09-08-1991  PCP: No primary care provider on file.  Admit date: 12/30/2017 Discharge date: 01/17/2018  Time spent: >  Recommendations for Outpatient Follow-up:  1. PCP 2. ID  Discharge Diagnoses:  Active Hospital Problems   Diagnosis Date Noted  . Infection of VP (ventriculoperitoneal) shunt (HCC) 12/31/2017  . Intraabdominal fluid collection   . Coagulase negative Staphylococcus bacteremia 12/31/2017  . Seizure disorder (HCC) 09/12/2017  . 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 History of syphilis   . History of cryptococcal meningitis   . 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 Bipolar disorder (HCC) 04/08/2013    Resolved Hospital Problems   Diagnosis Date Noted Date Resolved  . 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 Patient was seen by the PCPseveral days prior to admission with history ofrash along his abd wall where his VP shunt was.Patientwas initially placed on bactrim. The rash got worse and was tracking along the VP shunt location(under his skin along his chest wall). Patient was admitted with sepsis, and VP shunt infection.Patient has undergoneVP shunt removal and placement of EVD (on 01/01/18)by neurosurgery. Patient subsequently developed ileusand multiple abdominal abscesssecondary to VP shunt infection.He was also treated for Justin Ball Bacteremia with vancomycin.  Today, pt denies any abdominal pain, chest pain, N/V, fever/chills, headaches. Pt was able to tolerated regular diet, had a  good BM. Pt stable for discharge.   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 Culture VP shunt from brain grew Haemophilus, completed IV Vancomycin Cont PO Augmentin for another 2 weeks, need follow up CT abdomen per ID who will schedule Follow up with ID outpt  Ileus/SBO/Multiples intra-abdominal abscess General surgery/ ID following CT abdomen with intra-abdominal abscess IR consulted didn't recommended drainage placement S/P TPN, continue regular diet, able to tolerate ID recommendation as mentioned above  HIV/AIDS Continue with Cyndi Bender ID following. On Fluconazole for history of cryptococcus meningitis, bactrim for PJP ppx  Seizure Continue with Keppra  S/P VP Shunt Neurosurgery consulted, pt had VP shunt for cryptococcus meningitis in the past  VP shunt has been removed by neurosurgery. EVD discontinued on 1/15 CT head stable per neurosurgery, no further work up  Bipolar Continue Seroquel     Procedures:  VP shunt removal, EVD  Consultations:  ID  Gen Surg  Neurosurgery  Discharge Exam: BP 106/67 (BP Location: Left Arm)   Pulse 86   Temp 97.6 F (  36.4 C) (Oral)   Resp 17   Ht 6\' 1"  (1.854 m)   Wt 82.1 kg (180 lb 16 oz)   SpO2 98%   BMI 23.88 kg/m   General:  Alert, awake, NAD Cardiovascular: S1, S2 present, no added hrt  sound Respiratory: Chest clear B/L  Discharge Instructions You were cared for by a hospitalist during your hospital stay. If you have any questions about your discharge medications or the care you received while you were in the hospital after you are discharged, you can call the unit and asked to speak with the hospitalist on call if the hospitalist that took care of you is not available. Once you are discharged, your primary care physician will handle any further medical issues. Please note that NO REFILLS for any discharge medications will be authorized once you are discharged, as it is imperative that you return to your primary care physician (or establish a relationship with a primary care physician if you do not have one) for your aftercare needs so that they can reassess your need for medications and monitor your lab values.   Allergies as of 01/17/2018      Reactions   Acyclovir And Related       Medication List    TAKE these medications   acetaminophen 325 MG tablet Commonly known as:  TYLENOL Take 2 tablets (650 mg total) by mouth every 4 (four) hours as needed for mild pain (temp > 101.5).   amoxicillin-clavulanate 875-125 MG tablet Commonly known as:  AUGMENTIN Take 1 tablet by mouth every 12 (twelve) hours for 13 days.   dolutegravir 50 MG tablet Commonly known as:  TIVICAY Take 1 tablet (50 mg total) by mouth daily. What changed:  when to take this   emtricitabine-rilpivir-tenofovir AF 200-25-25 MG Tabs tablet Commonly known as:  ODEFSEY Take 1 tablet by mouth daily with breakfast.   fluconazole 200 MG tablet Commonly known as:  DIFLUCAN Take 1 tablet (200 mg total) by mouth daily.   levETIRAcetam 1000 MG tablet Commonly known as:  KEPPRA Take 1 tablet (1,000 mg total) by mouth 2 (two) times daily.   QUEtiapine 25 MG tablet Commonly known as:  SEROQUEL Take 1 tablet (25 mg total) by mouth 2 (two) times daily as needed (agitation). What changed:  when to take  this   sulfamethoxazole-trimethoprim 800-160 MG tablet Commonly known as:  BACTRIM DS,SEPTRA DS Take 1 tablet by mouth 3 (three) times a week. Start taking on:  01/18/2018 What changed:  when to take this   traMADol 50 MG tablet Commonly known as:  ULTRAM Take 1-2 tablets (50-100 mg total) by mouth every 6 (six) hours as needed for moderate pain or severe pain. Post-operatively.      Allergies  Allergen Reactions  . Acyclovir And Related    Follow-up Information    Comer, Belia Heman, MD Follow up.   Specialty:  Infectious Diseases Why:  Scheduled appointment on February 12 @ 3:45 pm  Contact information: [address] [name] [address] [phone]            The results of significant diagnostics from this hospitalization (including imaging, microbiology, ancillary and laboratory) are listed below for reference.    Significant Diagnostic Studies: Dg Chest 2 View  Result Date: 12/30/2017 CLINICAL DATA:  Pt here for chest pain. Had shunt placed in May that drains to stomach. Area to anterior chest and R lat neck is red and very tender to palpation. EXAM:  CHEST  2 VIEW COMPARISON:  07/12/2017 FINDINGS: Patient has a right-sided ventriculoperitoneal shunt, its visualized course unremarkable. The heart size is normal. The lungs are free of focal consolidations and pleural effusions. IMPRESSION: No active cardiopulmonary disease. Electronically Signed   By: Norva Pavlov M.D.   On: 12/30/2017 19:36   Ct Head Wo Contrast  Result Date: 01/08/2018 CLINICAL DATA:  27 year old male. 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 Recent progressive rash along the abdominal course of the shunt. Subsequently admitted with sepsis. Now status post shunt removal and EVD placement on 01/01/2018. EXAM: CT HEAD WITHOUT CONTRAST TECHNIQUE: Contiguous axial images were obtained from the base of the skull through the vertex without intravenous  contrast. COMPARISON:  CT head and neck 12/30/2017 and earlier. FINDINGS: Brain: New right superior frontal approach external ventricular drain. Catheter courses into the anterior right lateral ventricle, tip terminating adjacent to the septum pellucidum. Associated moderate volume pneumoventricle with mild generalized increased ventricular size compared to 12/30/2017. No transependymal edema is evident. No other pneumocephalus. No midline shift, mass effect, evidence of mass lesion, intracranial hemorrhage or evidence of cortically based acute infarction. Stable gray-white matter differentiation throughout the brain. Vascular: No suspicious intracranial vascular hyperdensity. Skull: No acute osseous abnormality identified. The same right frontal approach burr hole or shunt tract has been utilized for the EVD. Sinuses/Orbits: Visualized paranasal sinuses and mastoids are stable and well pneumatized. Other: Right nasoenteric tube in place. Visualized orbit soft tissues are within normal limits. Postoperative changes along the right scalp and visible posterior right neck. Broad-based mild scalp hematoma overlying the right vertex. IMPRESSION: 1. Removed right VP shunt and placement of right EVD via the same skull access. Mild scalp hematoma. 2. Moderate volume pneumo-ventricle and mild generalized increased ventricular size compared to 12/30/2017. No transependymal edema. 3. Otherwise stable noncontrast CT appearance of the brain. Electronically Signed   By: Odessa Fleming M.D.   On: 01/08/2018 12:18   Ct Head Wo Contrast  Result Date: 12/30/2017 CLINICAL DATA:  27 y/o M; history of cryptococcal meningitis post VP shunt. Febrile. Cellulitis along course of VP shunt catheter. Headache. EXAM: CT HEAD WITHOUT CONTRAST TECHNIQUE: Contiguous axial images were obtained from the base of the skull through the vertex without intravenous contrast. COMPARISON:  06/04/2017 CT head.  05/25/2017 MRI head. FINDINGS: Brain: Linear lucent  track traversing right frontal lobe into right caudate nucleus head from prior catheter insertion. Right frontal approach ventriculostomy catheter tip is in frontal horn of right lateral ventricle. Small lucencies present in right posterior limb of internal capsule, left caudate head, and left occipital lobe correspond to lesions on prior MRI of the brain. No evidence for new large acute infarct, hemorrhage, or focal mass effect. No hydrocephalus. No effacement of basilar cisterns. Vascular: No hyperdense vessel or unexpected calcification. Skull: Resolution of postsurgical changes related to right frontal burr hole. No acute osseous abnormality. Sinuses/Orbits: No acute finding. Other: None. IMPRESSION: 1. No acute intracranial abnormality identified. 2. Right frontal approach ventriculostomy catheter with tip in the frontal horn of right lateral ventricle. No hydrocephalus. 3. Small lucent foci are present within the right posterior limb of internal capsule, left caudate head, and left occipital cortex corresponding to lesions on prior MRI of the brain likely representing postinflammatory sequelae. Electronically Signed   By: Mitzi Hansen M.D.   On: 12/30/2017 23:08   Ct Soft Tissue Neck W Contrast  Result Date: 12/30/2017 CLINICAL DATA:  History of cryptococcal meningitis status  post VP shunt. Cellulitis along the course of the shunt with soft tissue swelling in the lower neck. Febrile. EXAM: CT NECK WITH CONTRAST TECHNIQUE: Multidetector CT imaging of the neck was performed using the standard protocol following the bolus administration of intravenous contrast. CONTRAST:  ISOVUE-300 IOPAMIDOL (ISOVUE-300) INJECTION 61% COMPARISON:  Neck CT 09/15/2016 FINDINGS: Pharynx and larynx: Symmetric pharyngeal soft tissues without evidence of mass. Fluid collections involving the floor of mouth, oropharynx, narrowed nasopharynx, and soft palate on the prior neck CT have resolved. The larynx is  unremarkable. Salivary glands: No inflammation, mass, or stone. Thyroid: Unremarkable. Lymph nodes: No enlarged or suspicious lymph nodes in the neck. Vascular: Major vascular structures of the neck appear patent. Limited intracranial: Small amount of focal low-density partially visualized involving anterior frontal cortex bilaterally and possibly cerebellum which may reflect sequelae of prior infection or infarct. Visualized orbits: Unremarkable. Mastoids and visualized paranasal sinuses: Clear. Skeleton: No acute osseous abnormality or suspicious osseous lesion. Upper chest: Clear lung apices. Other: The patient's VP shunt catheter courses through the right lateral and anterior lower neck and into the upper chest wall. No fluid collection is identified along the catheter tract. IMPRESSION: No fluid collection identified along the VP shunt catheter. Electronically Signed   By: Sebastian Ache M.D.   On: 12/30/2017 22:06   Ct Abdomen Pelvis W Contrast  Result Date: 01/09/2018 CLINICAL DATA:  Evaluate ileus. New onset diarrhea. No pain or nausea. EXAM: CT ABDOMEN AND PELVIS WITH CONTRAST TECHNIQUE: Multidetector CT imaging of the abdomen and pelvis was performed using the standard protocol following bolus administration of intravenous contrast. CONTRAST:  ISOVUE-300 IOPAMIDOL (ISOVUE-300) INJECTION 61% COMPARISON:  CT abdomen pelvis dated December 30, 2017. FINDINGS: Lower chest: New small left pleural effusion with adjacent left basilar atelectasis. Hepatobiliary: No focal liver abnormality is seen. No gallstones, gallbladder wall thickening, or biliary dilatation. Pancreas: Unremarkable. No pancreatic ductal dilatation or surrounding inflammatory changes. Spleen: Normal in size without focal abnormality. Adrenals/Urinary Tract: Adrenal glands are unremarkable. Kidneys are normal, without renal calculi, focal lesion, or hydronephrosis. Bladder is decompressed by a suprapubic catheter. Stomach/Bowel: Nasogastric  tube within the stomach. There are multiple moderately dilated loops of mid small bowel, with a transition point seen in the lower midline pelvis (series 3, image 73). The loops of ileum in the pelvis appear tethered. New mild wall thickening of the distal sigmoid colon and rectum. Unchanged wall thickening at the descending/sigmoid colon junction. Prior appendectomy. Vascular/Lymphatic: Prominent retroperitoneal lymph nodes measuring up to 1.1 cm are unchanged. No significant vascular findings. Reproductive: Prostate is unremarkable. Other: There are two new rim enhancing fluid collections in the pelvis, along the previous pathway of the ventriculoperitoneal shunt catheter, which has since been removed. One fluid collection is between loops of ileum and measures 3.1 x 1.6 x 2.0 cm (AP by transverse by CC). The other is just anterior to the rectum and measures approximately 3.6 x 4.5 x 5.0 cm (AP by transverse by CC). There is also a small rim enhancing fluid collection in the right anterior lower chest/upper abdominal wall at the site of the removed shunt catheter, measuring approximately 0.7 x 1.3 x 8.3 cm (AP by transverse by CC). No pneumoperitoneum. Musculoskeletal: No acute or significant osseous findings. IMPRESSION: 1. Interval removal of the ventriculoperitoneal shunt catheter, with two new rim enhancing fluid collections in the lower midline pelvis measuring up to 5.0 cm, along the course of the now removed shunt catheter, concerning for abscesses. 2. Moderately dilated  loops of small bowel with a transition point seen in the lower midline pelvis adjacent to one of the abscesses, suggesting small bowel obstruction. The decompressed ileal loops in the pelvis may possibly be tethered secondary to the pelvic inflammatory process. 3. New wall thickening of the distal sigmoid colon and rectum, possibly reactive. Unchanged wall thickening at the descending/sigmoid colon junction. 4. Additional small rim  enhancing fluid collection in the right lower anterior chest wall subcutaneous tissues at the site of the now removed shunt catheter. 5. Small left pleural effusion. Electronically Signed   By: Obie Dredge M.D.   On: 01/09/2018 17:09   Ct Abdomen Pelvis W Contrast  Result Date: 12/30/2017 CLINICAL DATA:  VP shunt infection from abdomen to chest. Rule out intra-abdominal pseudocyst. EXAM: CT ABDOMEN AND PELVIS WITH CONTRAST TECHNIQUE: Multidetector CT imaging of the abdomen and pelvis was performed using the standard protocol following bolus administration of intravenous contrast. CONTRAST:  100 cc Isovue-300 IV COMPARISON:  Abdominal CT 03/03/2017 FINDINGS: Lower chest: The lung bases are clear. Hepatobiliary: No focal liver abnormality is seen. No gallstones, gallbladder wall thickening, or biliary dilatation. Pancreas: No ductal dilatation or inflammation. Spleen: Normal in size without focal abnormality. Adrenals/Urinary Tract: Adrenal glands are unremarkable. Kidneys are normal, without renal calculi, focal lesion, or hydronephrosis. Bladder is decompressed by suprapubic tube. Stomach/Bowel: The stomach is nondistended. Lack of enteric contrast limits bowel evaluation. Sigmoid colon is tortuous. Possible colonic wall thickening the descending/sigmoid colonic junction for example image 84 series 8. No evidence of bowel obstruction. Liquid stool in the ascending colon without colonic inflammation or wall thickening. Vascular/Lymphatic: Shotty retroperitoneal nodes left periaortic station. Small mesenteric nodes. No acute vascular abnormality. Reproductive: Prostate is unremarkable. Other: Ventriculoperitoneal shunt catheter coursing in the right lower chest and to the right of the midline in the abdomen. There is stranding and small amount of fluid surrounding the shunt catheter in the subcutaneous tissues. Small amount of fluid about the pelvic portion of shunt catheter tubing best appreciated on coronal  and sagittal reformats image 65 and 1 and 3 respectively. Tip of the shunt in the left pelvis without focal fluid collection at the tip. Trace free fluid in the left pericolic gutter and scattered throughout the mesentery. Musculoskeletal: There are no acute or suspicious osseous abnormalities. IMPRESSION: 1. Stranding and fluid about the subcutaneous portion ventriculoperitoneal shunt in the right lower chest and abdomen. Fluid about the intrapelvic portion of the shunt. Infection is considered. 2. Possible bowel wall thickening at the descending sigmoid colonic junction in the region of peritoneal catheter shunt fluid, may be reactive or infectious/inflammatory. 3. Suprapubic tube decompressing the urinary bladder. Electronically Signed   By: Rubye Oaks M.D.   On: 12/30/2017 22:59   Dg Abd Portable 1v  Result Date: 01/12/2018 CLINICAL DATA:  27 year old male with abdominal pain. Recent VP shunt infection status post shunt removal. Pelvic abscess on recent CT EXAM: PORTABLE ABDOMEN - 1 VIEW COMPARISON:  CT Abdomen and Pelvis 01/09/2018 and earlier. FINDINGS: Dilated gas-filled small bowel loops persist up to 54 millimeters diameter today. But normal large bowel gas pattern with residual oral contrast throughout the colon from the hepatic flexure to the rectum. No definite pneumoperitoneum, these may be supine images. Enteric tube has been pulled back or removed and tubing is partially visible over the lower mediastinum at the level of the distal esophagus (arrow). Otherwise negative lung bases. No acute osseous abnormality identified. IMPRESSION: 1. Continued small bowel obstruction, which appears to be high-grade  partial obstruction. No improvement from the recent CT but oral contrast has reached the large bowel. 2. Tube tip projecting over the lower mediastinum may be the NG tube which has been pulled back. This should be advanced at least 9 centimeters to allow placement within the stomach.  Electronically Signed   By: Odessa Fleming M.D.   On: 01/12/2018 08:31   Dg Abd Portable 1v-small Bowel Obstruction Protocol-initial, 8 Hr Delay  Result Date: 01/08/2018 CLINICAL DATA:  Follow-up small bowel obstruction EXAM: PORTABLE ABDOMEN - 1 VIEW COMPARISON:  Abdominal radiographs from earlier today FINDINGS: There are persistent markedly dilated small bowel loops throughout the abdomen measuring up to the 5.3 cm diameter, not appreciably changed. Dilute oral contrast is noted throughout the large bowel and rectum. No evidence of pneumatosis or pneumoperitoneum. IMPRESSION: Persistent prominently dilated small bowel loops throughout the abdomen, unchanged. Dilute oral contrast is present throughout the large bowel and rectum. Findings suggest adynamic ileus or partial distal small bowel obstruction. Electronically Signed   By: Delbert Phenix M.D.   On: 01/08/2018 22:25   Dg Abd Portable 1v  Result Date: 01/08/2018 CLINICAL DATA:  27 year old male with NG tube placement. EXAM: PORTABLE ABDOMEN - 1 VIEW COMPARISON:  Abdominal radiograph dated 01/04/2018 and CT dated 12/30/2017 FINDINGS: Visualized distal enteric tube with tip in the gastric fundus. Diffusely air distended loops of small bowel measure up to 6 cm in the mid abdomen. No free air identified on the provided image. No radiopaque calculi. The osseous structures are intact. IMPRESSION: 1. Enteric tube with tip in the gastric fundus. There is persistent dilatation of the small bowel loops. 2. No free air. Electronically Signed   By: Elgie Collard M.D.   On: 01/08/2018 05:12   Dg Abd Portable 1v  Result Date: 01/04/2018 CLINICAL DATA:  Followup bowel distension/ileus EXAM: PORTABLE ABDOMEN - 1 VIEW COMPARISON:  01/02/2018 FINDINGS: An NG tube is identified with tip overlying the mid stomach. Distended small bowel loops are again noted, may be minimally increased in caliber. Stool and gas in the rectum are present. No new abnormalities are identified.  IMPRESSION: Distended small bowel loops, may be slightly increased in caliber. Electronically Signed   By: Harmon Pier M.D.   On: 01/04/2018 09:32   Dg Abd Portable 1v  Result Date: 01/02/2018 CLINICAL DATA:  Ileus EXAM: PORTABLE ABDOMEN - 1 VIEW COMPARISON:  January 01, 2018 FINDINGS: Nasogastric tube tip and side port are in the distal stomach region. There remain loops of dilated small bowel. Air is seen in colon and rectum. There is moderate stool in the colon. No air-fluid levels. No free air evident. IMPRESSION: Nasogastric tube tip and side port in distal stomach. Multiple loops of dilated small bowel. Suspect ileus or enteritis as most likely etiologies. A degree of partial small bowel obstruction cannot be entirely excluded. No free air evident. Electronically Signed   By: Bretta Bang III M.D.   On: 01/02/2018 08:21   Dg Abd Portable 1v  Result Date: 01/01/2018 CLINICAL DATA:  NG tube placement. EXAM: PORTABLE ABDOMEN - 1 VIEW COMPARISON:  Radiographs earlier this day. FINDINGS: Tip of the enteric tube in the stomach, side-port in the region of the distal esophagus. Decreased gaseous gastric distention from prior. Persistent small bowel dilatation. IMPRESSION: Tip of the enteric tube in the stomach, side-port in the region of the distal esophagus. Recommend advancement of at least 5 cm for optimal placement. Decreased gaseous gastric distension with persistent small bowel dilatation. Electronically  Signed   By: Rubye OaksMelanie  Ehinger M.D.   On: 01/01/2018 03:41   Dg Abd Portable 1v  Result Date: 01/01/2018 CLINICAL DATA:  Nausea and vomiting.  Status post VP shunt. EXAM: PORTABLE ABDOMEN - 1 VIEW COMPARISON:  Abdominal CT 12/30/2017 FINDINGS: New gaseous gastric distension. New small bowel dilatation up to 4.2 cm. No evidence of free air. Ventriculoperitoneal shunt catheter tubing in the right abdomen terminating in the pelvis. IMPRESSION: New gaseous gastric distention and small bowel dilatation,  ileus versus early small bowel obstruction. Electronically Signed   By: Rubye OaksMelanie  Ehinger M.D.   On: 01/01/2018 00:42    Microbiology: Recent Results (from the past 240 hour(s))  C difficile quick scan w PCR reflex     Status: None   Collection Time: 01/09/18  9:36 AM  Result Value Ref Range Status   C Diff antigen NEGATIVE NEGATIVE Final   C Diff toxin NEGATIVE NEGATIVE Final   C Diff interpretation No C. difficile detected.  Final     Labs: Basic Metabolic Panel: Recent Labs  Lab 01/10/18 1056 01/11/18 0416 01/12/18 0330 01/13/18 0528 01/14/18 0406 01/15/18 1013 01/16/18 0523 01/17/18 0401  NA 136 137 133* 133* 134* 134* 135 134*  K 3.7 4.1 4.5 4.4 4.3 4.3 4.0 4.2  CL 104 103 98* 100* 101 100* 100* 102  CO2 23 24 23 23 22 23 25 24   GLUCOSE 93 96 93 105* 105* 109* 108* 84  BUN <5* <5* 9 13 17 16 13 15   CREATININE 0.48* 0.90 0.80 0.94 0.97 0.90 0.90 0.93  CALCIUM 8.6* 8.9 9.1 9.3 9.3 9.0 9.1 9.0  MG 1.8 2.1 1.9 2.1 2.0  --   --  1.7  PHOS 3.7 4.2  --  4.7* 4.2  --   --  4.0   Liver Function Tests: Recent Labs  Lab 01/10/18 1056 01/14/18 0406 01/17/18 0401  AST 32 46* 39  ALT 15* 27 26  ALKPHOS 74 144* 111  BILITOT 1.4* 1.1 0.9  PROT 7.4 8.6* 7.9  ALBUMIN 2.6* 3.1* 2.9*   No results for input(s): LIPASE, AMYLASE in the last 168 hours. No results for input(s): AMMONIA in the last 168 hours. CBC: Recent Labs  Lab 01/11/18 0416 01/13/18 0528 01/14/18 0406 01/15/18 1013  WBC 21.5* 17.3* 15.6* 13.0*  NEUTROABS  --   --  12.6*  --   HGB 10.7* 10.8* 10.6* 10.1*  HCT 32.4* 33.3* 31.4* 31.8*  MCV 97.3 96.5 96.6 97.5  PLT 423* 427* 390 374   Cardiac Enzymes: No results for input(s): CKTOTAL, CKMB, CKMBINDEX, TROPONINI in the last 168 hours. BNP: BNP (last 3 results) No results for input(s): BNP in the last 8760 hours.  ProBNP (last 3 results) No results for input(s): PROBNP in the last 8760 hours.  CBG: Recent Labs  Lab 01/15/18 0827 01/15/18 1936  01/16/18 0818 01/16/18 1956 01/17/18 0819  GLUCAP 116* 104* 96 110* 124*       Signed:  Briant CedarNkeiruka J Ezenduka, MD [facility] 01/17/2018, 10:46 AM

## 2018-01-17 NOTE — Progress Notes (Signed)
16 Days Post-Op   Subjective/Chief Complaint: Pt with no n/v + BM   Objective: Vital signs in last 24 hours: Temp:  [97.6 F (36.4 C)-98.6 F (37 C)] 97.6 F (36.4 C) (01/24 0511) Pulse Rate:  [74-82] 74 (01/24 0511) Resp:  [18-20] 20 (01/24 0511) BP: (94-118)/(58-76) 94/58 (01/24 0511) SpO2:  [99 %-100 %] 99 % (01/24 0511) Weight:  [82.1 kg (180 lb 16 oz)] 82.1 kg (180 lb 16 oz) (01/24 0511) Last BM Date: (1/20)  Intake/Output from previous day: 01/23 0701 - 01/24 0700 In: 1490 [P.O.:240; I.V.:1140; IV Piggyback:110] Out: 2200 [Urine:2200] Intake/Output this shift: No intake/output data recorded.  General appearance: alert and cooperative GI: soft, non-tender; bowel sounds normal; no masses,  no organomegaly  Lab Results:  Recent Labs    01/15/18 1013  WBC 13.0*  HGB 10.1*  HCT 31.8*  PLT 374   BMET Recent Labs    01/16/18 0523 01/17/18 0401  NA 135 134*  K 4.0 4.2  CL 100* 102  CO2 25 24  GLUCOSE 108* 84  BUN 13 15  CREATININE 0.90 0.93  CALCIUM 9.1 9.0   PT/INR No results for input(s): LABPROT, INR in the last 72 hours. ABG No results for input(s): PHART, HCO3 in the last 72 hours.  Invalid input(s): PCO2, PO2  Studies/Results: No results found.  Anti-infectives: Anti-infectives (From admission, onward)   Start     Dose/Rate Route Frequency Ordered Stop   01/16/18 1230  amoxicillin-clavulanate (AUGMENTIN) 875-125 MG per tablet 1 tablet     1 tablet Oral Every 12 hours 01/16/18 1218     01/15/18 1800  metroNIDAZOLE (FLAGYL) IVPB 500 mg  Status:  Discontinued     500 mg 100 mL/hr over 60 Minutes Intravenous Every 8 hours 01/15/18 1620 01/16/18 1218   01/15/18 1400  metroNIDAZOLE (FLAGYL) tablet 500 mg  Status:  Discontinued     500 mg Oral Every 8 hours 01/15/18 1017 01/15/18 1620   01/11/18 1600  cefTRIAXone (ROCEPHIN) 2 g in dextrose 5 % 50 mL IVPB  Status:  Discontinued     2 g 100 mL/hr over 30 Minutes Intravenous Every 24 hours 01/11/18  1435 01/16/18 1218   01/11/18 1500  metroNIDAZOLE (FLAGYL) IVPB 500 mg  Status:  Discontinued     500 mg 100 mL/hr over 60 Minutes Intravenous Every 8 hours 01/11/18 1435 01/15/18 1017   01/09/18 1800  Ampicillin-Sulbactam (UNASYN) 3 g in sodium chloride 0.9 % 100 mL IVPB  Status:  Discontinued     3 g 200 mL/hr over 30 Minutes Intravenous Every 6 hours 01/09/18 1730 01/11/18 1435   01/07/18 1314  vancomycin (VANCOCIN) IVPB 750 mg/150 ml premix  Status:  Discontinued     750 mg 150 mL/hr over 60 Minutes Intravenous Every 8 hours 01/07/18 1314 01/10/18 1051   01/05/18 0500  vancomycin (VANCOCIN) IVPB 1000 mg/200 mL premix  Status:  Discontinued     1,000 mg 200 mL/hr over 60 Minutes Intravenous Every 8 hours 01/04/18 1349 01/07/18 1314   01/04/18 1000  fluconazole (DIFLUCAN) tablet 200 mg     200 mg Oral Daily 01/04/18 0844     01/03/18 1000  fluconazole (DIFLUCAN) IVPB 200 mg  Status:  Discontinued     200 mg 100 mL/hr over 60 Minutes Intravenous Every 24 hours 01/02/18 1525 01/04/18 0844   01/02/18 1100  vancomycin (VANCOCIN) 1,250 mg in sodium chloride 0.9 % 250 mL IVPB  Status:  Discontinued     1,250  mg 166.7 mL/hr over 90 Minutes Intravenous Every 8 hours 01/02/18 1025 01/04/18 1137   01/02/18 0900  sulfamethoxazole-trimethoprim (BACTRIM DS,SEPTRA DS) 800-160 MG per tablet 1 tablet     1 tablet Oral Once per day on Mon Wed Fri 12/31/17 1003     01/01/18 0930  fluconazole (DIFLUCAN) IVPB 400 mg  Status:  Discontinued     400 mg 100 mL/hr over 120 Minutes Intravenous Every 24 hours 01/01/18 0830 01/02/18 1525   12/31/17 2200  vancomycin (VANCOCIN) IVPB 1000 mg/200 mL premix  Status:  Discontinued     1,000 mg 200 mL/hr over 60 Minutes Intravenous Every 8 hours 12/31/17 2048 01/02/18 1025   12/31/17 1400  cefTAZidime (FORTAZ) 2 g in dextrose 5 % 50 mL IVPB  Status:  Discontinued     2 g 100 mL/hr over 30 Minutes Intravenous Every 8 hours 12/31/17 1119 01/01/18 0808   12/31/17 1200   dolutegravir (TIVICAY) tablet 50 mg     50 mg Oral Daily 12/31/17 0207     12/31/17 1200  emtricitabine-rilpivir-tenofovir AF (ODEFSEY) 200-25-25 MG per tablet 1 tablet     1 tablet Oral Daily with breakfast 12/31/17 0207     12/31/17 1100  fluconazole (DIFLUCAN) tablet 400 mg  Status:  Discontinued     400 mg Oral Daily 12/31/17 1003 01/01/18 0829   12/31/17 1000  vancomycin (VANCOCIN) IVPB 1000 mg/200 mL premix  Status:  Discontinued     1,000 mg 200 mL/hr over 60 Minutes Intravenous 2 times daily 12/31/17 0444 12/31/17 2048   12/31/17 0215  sulfamethoxazole-trimethoprim (BACTRIM DS,SEPTRA DS) 800-160 MG per tablet 1 tablet  Status:  Discontinued     1 tablet Oral 2 times daily 12/31/17 0207 12/31/17 1003   12/31/17 0215  cefTRIAXone (ROCEPHIN) 2 g in dextrose 5 % 50 mL IVPB  Status:  Discontinued     2 g 100 mL/hr over 30 Minutes Intravenous Every 24 hours 12/31/17 0207 12/31/17 1119   12/30/17 2345  vancomycin (VANCOCIN) 1,500 mg in sodium chloride 0.9 % 500 mL IVPB     1,500 mg 250 mL/hr over 120 Minutes Intravenous  Once 12/30/17 2331 12/31/17 0324   12/30/17 2330  cefTRIAXone (ROCEPHIN) 2 g in dextrose 5 % 50 mL IVPB     2 g 100 mL/hr over 30 Minutes Intravenous  Once 12/30/17 2328 12/31/17 0122      Assessment/Plan: s/p Procedure(s): REMOVAL OF V-P SHUNT, PLACEMENT OF EVD (N/A) Advance diet to Soft Mobilize as tol  LOS: 17 days    Marigene Ehlersamirez Jr., Jed LimerickArmando 01/17/2018

## 2018-01-17 NOTE — Progress Notes (Signed)
[facility]  Date of Admission:  12/30/2017     Total days of antibiotics 16  Day 2 Augmentin   Reason for Visit: Follow up on abdominal abscess          Patient ID: Justin Ball is a 27 y.o. 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 amoxicillin-clavulanate  1 tablet Oral Q12H  . chlorhexidine  15 mL Mouth Rinse BID  . Chlorhexidine Gluconate Cloth  6 each Topical Daily  . dolutegravir  50 mg Oral Daily  . emtricitabine-rilpivir-tenofovir AF  1 tablet Oral Q breakfast  . fluconazole  200 mg Oral Daily  . mouth rinse  15 mL Mouth Rinse q12n4p  . metoCLOPramide (REGLAN) injection  10 mg Intravenous Q6H  . nicotine  14 mg Transdermal Daily  . sodium chloride flush  10-40 mL Intracatheter Q12H  . sulfamethoxazole-trimethoprim  1 tablet Oral Once per day on Mon Wed Fri   Allergies  Allergen Reactions  . Acyclovir And Related     Interval History:  Feeling well today and excited to go home. No complaints. Up working with OT brushing teeth and dressing during encounter. Had a bowel movement yesterday and pancakes for breakfast today.   OBJECTIVE: Vitals:   01/16/18 2108 01/17/18 0038 01/17/18 0511 01/17/18 0822  BP: 110/61 118/63 (!) 94/58 106/67  Pulse: 82 81 74 86  Resp: 18 18 20 17   Temp: 98.4 F (36.9 C) 98.1 F (36.7 C) 97.6 F (36.4 C) 97.6 F (36.4 C)  TempSrc: Oral Oral Oral Oral  SpO2: 100% 100% 99% 98%  Weight:   180 lb 16 oz (82.1 kg)   Height:       Body mass index is 23.88 kg/m.  Physical Exam  Constitutional: He is oriented to person, place, and time and  well-developed, well-nourished, and in no distress.  Up at the sink brushing teeth. In good spirits today.   HENT:  Mouth/Throat: Oropharynx is clear and moist. No oral lesions. Normal dentition. No dental caries.  Staples noted to right temporal region clean and dry.   Eyes: EOM are normal. Pupils are equal, round, and reactive to light. No scleral icterus.  Neck: Normal range of motion.  Cardiovascular: Normal rate, regular rhythm and normal heart sounds.  Pulmonary/Chest: Effort normal and breath sounds normal.  Abdominal: Soft. Bowel sounds are normal. He exhibits no distension. There is no tenderness. There is no rigidity.  Genitourinary:  Genitourinary Comments: Suprapubic catheter in place. Scant bloody drainage around insertion site. No dressing over site.   Musculoskeletal:  Walking with rolling walker.   Lymphadenopathy:    He has no cervical adenopathy.  Neurological: He is alert and oriented to person, place, and time.  Skin: Skin is warm and dry. No rash noted.  Psychiatric: Mood and affect normal.  Nursing note and vitals reviewed. RUE PICC site clean and dry.   Lab Results Lab Results  Component Value Date   WBC 13.0 (H) 01/15/2018   HGB 10.1 (L) 01/15/2018   HCT 31.8 (L) 01/15/2018  MCV 97.5 01/15/2018   PLT 374 01/15/2018    Lab Results  Component Value Date   CREATININE 0.93 01/17/2018   BUN 15 01/17/2018   NA 134 (L) 01/17/2018   K 4.2 01/17/2018   CL 102 01/17/2018   CO2 24 01/17/2018    Lab Results  Component Value Date   ALT 26 01/17/2018   AST 39 01/17/2018   ALKPHOS 111 01/17/2018   BILITOT 0.9 01/17/2018     Microbiology: Recent Results (from the past 240 hour(s))  C difficile quick scan w PCR reflex     Status: None   Collection Time: 01/09/18  9:36 AM  Result Value Ref Range Status   C Diff antigen NEGATIVE NEGATIVE Final   C Diff toxin NEGATIVE NEGATIVE Final   C Diff interpretation No C. difficile detected.  Final    ASSESSMENT: 1. Abdominal abscesses 1/16 scan 2/2 infected VP shunt. (Removed 01/01/18) 2. Coagulase negative staph bacteremia -- tx completed with vanc x 10d 3. VP shunt infection - haemophilus influenzae  4. HIV - recent CD4 400 after several months of viral suppression 5. H/O Cryptococcal Meningitis   PLAN: 1. Tolerating Augmentin well. Continue this BID for 2 weeks - will re-evaluate duration of therapy at upcoming outpatient visit and results of CT scan.  2. He has a follow up appointment with Dr. Luciana Axe on February 12th.  3. Will contact our referral coordinator to arrange schedule for CT scan next week.  4. Continue daily Tivicay, Odefsey, Bactrim 3x/week and daily fluconazole.  5. OK from ID stand point to be discharged. Available as needed.   Rexene Alberts, MSN, NP-C [facility] for Infectious Disease [facility] Pager: [phone]  01/17/2018  9:41 AM

## 2018-01-17 NOTE — Progress Notes (Signed)
PHARMACY - ADULT TOTAL PARENTERAL NUTRITION CONSULT NOTE   Pharmacy Consult:  TPN Indication:  Ileus  Patient Measurements: Height: 6\' 1"  (185.4 cm) Weight: 180 lb 16 oz (82.1 kg) IBW/kg (Calculated) : 79.9 TPN AdjBW (KG): 78.8 Body mass index is 23.88 kg/m.  Assessment:  [age] YOM with history of neurogenic bowel presented on 12/31/17 with fever and rash.  He was also having nausea and vomiting with hypoactive bowel sounds and distended abdomen on 01/01/18.  Diagnosed with ileus secondary to infected VP shunt.  Patient tolerated intermittent clamping of NG tube and consumed some clear liquids.  On 01/08/18, he vomited a large amount of green/clear bile.  Pharmacy consulted to manage TPN.  Patient reports no recent weight loss/gain and he was eating regularly PTA.  GI: prealbumin 8 >> 23.3, emesis 240mL on 1/23. 1/16 CT showed SBO - Reglan IV, PRN Zofran *advanced to full liquid on 1/22, vomited then back to NPO that evening, then soft diet 1/23 PM Endo: no hx DM - CBGs low normal.  SSI d/c'ed 01/14/18  Lytes: all WNL except slightly low Na Renal: neurogenic bladder - SCr 0.93 stable, BUN WNL.  UOP 1.1 ml/kg/hr, NS 20K at 10 ml/hr, net -15.7L Pulm: stable on RA - nicotine patch Cards: VSS Hepatobil: Hep B - LFTs / tbili WNL.  TG normalized. Neuro: Bipolar / depression / CVA / seizure - Keppra IV, PRN APAP/morphine/Seroquel.  Pain score 4-9 ID: s/p Vanc for CoNS bacteremia + VP shunt infxn.  Augmentin/Fluc for cellulitis/abdominal abscess.  Afebrile, WBC down to 13 AIDS - dolutegravir, Odefsey, Septra/Fluc for prophylaxis Hx cryptococcal meningitis s/p VP shunt >> removed and EVD replaced 1/9 >> removed 1/15 TPN Access: PICC placed 01/08/18 TPN start date: 01/08/18  Nutritional Goals (per RD rec 1/17): 2200-2400 kCal and 110-125 gm protein per day  Current Nutrition:  TPN Soft diet (eating up to 100% of meals)   Plan:  Reduce TPN to 40 ml/hr and D/C in 2 hours or when patient is  discharged (RN aware) D/C TPN labs and nursing care orders   Ladarren Steiner D. Laney Potashang, PharmD, BCPS Pager:  [phone] 01/17/2018, 10:04 AM

## 2018-01-25 MED FILL — SULFAMETHOXAZOLE-TMP DS TAB: 800-160 | 30 days supply | Qty: 12 | Fill #0

## 2018-01-25 MED FILL — AMOX TR-K CLV 875-125 MG TA: 875-125 | 13 days supply | Qty: 26 | Fill #0

## 2018-01-28 MED FILL — FLUCONAZOLE 200 MG TAB: 200 | 30 days supply | Qty: 60 | Fill #2

## 2018-01-29 ENCOUNTER — Ambulatory Visit (INDEPENDENT_AMBULATORY_CARE_PROVIDER_SITE_OTHER): Payer: Medicaid Other | Admitting: Infectious Diseases

## 2018-01-29 ENCOUNTER — Encounter: Payer: Self-pay | Admitting: Infectious Diseases

## 2018-01-29 VITALS — BP 100/69 | HR 106 | Temp 97.9°F | Ht 73.0 in | Wt 171.0 lb

## 2018-01-29 DIAGNOSIS — R195 Other fecal abnormalities: Secondary | ICD-10-CM | POA: Diagnosis not present

## 2018-01-29 DIAGNOSIS — Z982 Presence of cerebrospinal fluid drainage device: Secondary | ICD-10-CM

## 2018-01-29 DIAGNOSIS — R188 Other ascites: Secondary | ICD-10-CM | POA: Diagnosis not present

## 2018-01-29 DIAGNOSIS — T85730D Infection and inflammatory reaction due to ventricular intracranial (communicating) shunt, subsequent encounter: Secondary | ICD-10-CM | POA: Diagnosis not present

## 2018-01-29 DIAGNOSIS — G40909 Epilepsy, unspecified, not intractable, without status epilepticus: Secondary | ICD-10-CM | POA: Diagnosis not present

## 2018-01-29 DIAGNOSIS — Z9359 Other cystostomy status: Secondary | ICD-10-CM

## 2018-01-29 DIAGNOSIS — B20 Human immunodeficiency virus [HIV] disease: Secondary | ICD-10-CM

## 2018-01-29 NOTE — Progress Notes (Signed)
Justin Ball [birth year] [id number]   HPI: Justin Ball is a 27 y.o. male with HIV infection and complicated medical history here today with his grandmother for an acute visit. She reports he has not been feeling himself over the last 2 days and is concerned about him and would like him to be seen. He was just discharged from [facility] on 01/17/18 for VP shunt infection and abdominal abscesses. During this hospitalization he required TNA nutrition, vancomycin/ceftriaxone for treatment of infections and was discharged home with augmentin.    Justin Ball does not contribute much during the encounter but his grandmother reports he has had diarrhea x 2 days, fatigue and "not acting himself." No vomiting and he keeps foods down. No evidence of fevers or episodes of diaphoresis. He has had 2-4 loose BMs the last 2 days but denies any cramping, distension or abdominal pain. He has not had follow up with neurosurgery and describes increased aching pain where his sutures are and she would like something stronger for pain. He is currently taking only as needed tylenol which does work generally to reduce pain. She did have to give him something for 'agitation' last night for the first time in a while.  She is not sure what antibiotics he should be taking now and has not picked up the augmentin he was supposed to continue at discharge. He has continued his tivicay, odefsey fluconazole daily and bactrim 3x weekly. She also believes she is giving him the keppra still and wonders if she should stop that.   Patient Active Problem List   Diagnosis Date Noted  . Intraabdominal fluid collection     Priority: High  . Infection of VP (ventriculoperitoneal) shunt ([place]) 12/31/2017    Priority: High  . 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 Suprapubic catheter ([place]) 01/30/2018  . Loose stools 01/30/2018  . Fecal incontinence 11/19/2017  . Seizure disorder ([place]) 09/12/2017  . S/P VP shunt 06/26/2017    . 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 History of syphilis   . Tobacco abuse   . History of cryptococcal meningitis   . 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 Anemia 09/12/2016  . 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 Bipolar disorder ([place]) 04/08/2013    Patient's Medications  New Prescriptions   No medications on file  Previous Medications   ACETAMINOPHEN (TYLENOL) 325 MG TABLET    Take 2 tablets (650 mg total) by mouth every 4 (four) hours as needed for mild pain (temp > 101.5).   AMOXICILLIN-CLAVULANATE (AUGMENTIN) 875-125 MG TABLET    Take 1 tablet by mouth every 12 (twelve) hours for 13 days.   DOLUTEGRAVIR (TIVICAY) 50 MG TABLET    Take 1 tablet (50 mg total) by mouth daily.   EMTRICITABINE-RILPIVIR-TENOFOVIR AF (ODEFSEY) 200-25-25 MG TABS TABLET    Take 1 tablet by mouth daily with breakfast.   FLUCONAZOLE (DIFLUCAN) 200 MG TABLET    Take 1 tablet (200 mg total) by mouth daily.   LEVETIRACETAM (KEPPRA) 1000 MG TABLET    Take 1 tablet (1,000 mg total) by mouth 2 (two) times daily.   QUETIAPINE (SEROQUEL) 25 MG TABLET    Take 1 tablet (25 mg total) by mouth 2 (two) times daily as needed (agitation).   SULFAMETHOXAZOLE-TRIMETHOPRIM (BACTRIM DS,SEPTRA DS) 800-160 MG TABLET    Take 1 tablet by mouth 3 (three) times a week.   TRAMADOL Veatrice Bourbon)  50 MG TABLET    Take 1-2 tablets (50-100 mg total) by mouth every 6 (six) hours as needed for moderate pain or severe pain. Post-operatively.  Modified Medications   No medications on file  Discontinued Medications   No medications on file    Review of Systems  Unable to perform ROS: Medical condition      Past Medical History:  Diagnosis Date  . 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 Anxiety   . 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  Bipolar 1 disorder ([place])   . 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 Cognitive communication deficit    working w/ therapy  . 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 Depression   . 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 Fecal incontinence 11/19/2017  . Foley catheter in place   . 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 History of scabies 2013  . History of syphilis   . 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 Neurogenic bowel    wears depends  . Neuromuscular disorder ([place])   . 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 Seizure disorder [facility])    neurologist-  dr Leta Baptist ([place] [facility])--caused by cryptococcal meningitis/ multiple infarctions--  no seizure since hospitalization May 2018  . Status epilepticus ([place])   . Visuospatial deficit     Social History   Tobacco Use  . Smoking status: Current Every Day Smoker    Packs/day: 0.50    Years: 5.00    Pack years: 2.50    Types: Cigarettes  . Smokeless tobacco: Never Used  . Tobacco comment: cutting back  Substance Use Topics  . Alcohol use: No    Alcohol/week: 0.0 oz  . Drug use: No    Family History  Problem Relation Age of Onset  . Hypertension Other      Allergies  Allergen Reactions  . Acyclovir And Related      OBJECTIVE: Vitals:   01/29/18 1512  BP: 100/69  Pulse: (!) 106  Temp: 97.9 F (36.6 C)  TempSrc: Oral  Weight: 171 lb (77.6 kg)  Height: 6' 1" (1.854 m)   Body mass index is 22.56 kg/m.  Physical Exam  Constitutional: No distress.  Thin but nourished AA male.   HENT:    Mouth/Throat: Oropharynx is clear and moist.  Eyes: Pupils are equal, round, and reactive to light. No scleral icterus.  Neck: Normal range of motion.  Cardiovascular: Normal rate, regular rhythm and normal heart sounds.  No murmur heard. Pulmonary/Chest: Effort normal and breath sounds normal. No respiratory distress. He has no rales.  Abdominal: Soft. Bowel sounds are normal. He exhibits no distension. There is no tenderness.  Genitourinary:  Genitourinary Comments: Suprapubic catheter in place. Tension noted from catheter to where bag is down on his leg. Some odorous tan mucopurulent drainage noted. No dressing in place.   Musculoskeletal: He exhibits no edema.  Lymphadenopathy:  He has no cervical adenopathy.  Neurological: He is alert. No cranial nerve deficit. Coordination normal.  Skin: Skin is warm and dry.  Psychiatric:  Flat affect    ASSESSMENT & PLAN:  Problem List Items Addressed This Visit      Nervous and Auditory   Infection of VP (ventriculoperitoneal) shunt (HCC)    Shunt has been removed. No signs of increased ICP today with exam. His surgical staples are still in place and he has not had follow up with neurosurgeon. I believe much of his pain is due to these staples but do not want to remove them without being seen by neurosurgery as long as we can get him an appointment in reasonable time frame. I will have him sit with referral coordinator to schedule follow up appt today. Advised to increase tylenol to scheduled 2 times a day with 3rd dose if needed. I explained that I do not want him to receive anything that is altering that could cloud our assessment to determine what is going on with Justin Ball considering he is quite complicated. He does look comfortable on exam today and reports no pain to me when asked.       Seizure disorder [facility])    Reviewed D/C summary with his grandmother and he should still be getting the [place]. Advised to continue this until FU with  neurology team.         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 Continue tivicay, odefsey, fluconazole and bactrim.   Lab Results  Component Value Date   CD4TCELL 9 (L) 01/01/2018   CD4TABS 400 01/01/2018   Lab Results  Component Value Date   HIV1RNAQUANT <20 01/01/2018         Intraabdominal fluid collection    Abdominal exam benign today. He should be taking the amox-clav x 14 days to continue treatment of these abscesses. He has also not had repeat CT scan yet. I will have him meet with referral coordinator and schedule this exam. Medically necessary to evaluate the un drained fluid collections to ensure proper treatment duration with antibiotics.       Loose stools    She is wondering if he was exposed to someone that was ill but is nervous about "what to look for" since Justin Ball has so much going on. I sent her home with a stool collection kit to provide a same day sample to [place] should his diarrhea continue to be > 4 stools a day or worsen in frequency / quality. I asked her to buy a thermometer and start checking his temperatures periodically as well to see if he is having any fevers that would give [place] more information.   Advised to try to stick to bland diet for now and make sure he drinks enough fluids for now.       S/P VP shunt - Primary   Relevant Orders   Ambulatory referral to Neurosurgery   Suprapubic catheter ([place])    I am not happy with the drainage from his cath site - I have cleaned the site today and applied a dressing to prevent trauma from underwear and collect drainage. Advised to also not let the catheter pull so tight to avoid trauma and reduce risk for UTI. I am hopeful that the amox-clav will also treat this for him. His grandmother reports that the catheter is also due to be changed and someone will be out to  the house this week from home health team to do that.         Will have him keep his follow  up appt with Dr. Linus Salmons next week to follow up on symptoms today. I reviewed discharge summary and instructions and she will pick up the medications she is missing for his care today.   Janene Madeira, MSN, [facility] [place] [facility]  01/30/2018  3:24 PM

## 2018-01-30 DIAGNOSIS — Z9359 Other cystostomy status: Secondary | ICD-10-CM | POA: Insufficient documentation

## 2018-01-30 DIAGNOSIS — R195 Other fecal abnormalities: Secondary | ICD-10-CM | POA: Insufficient documentation

## 2018-01-30 NOTE — Assessment & Plan Note (Signed)
Finally starting to show reconstruction of immune system. Continue tivicay, odefsey, fluconazole and bactrim.   Lab Results  Component Value Date   CD4TCELL 9 (L) 01/01/2018   CD4TABS 400 01/01/2018   Lab Results  Component Value Date   HIV1RNAQUANT <20 01/01/2018

## 2018-01-30 NOTE — Assessment & Plan Note (Signed)
She is wondering if he was exposed to someone that was ill but is nervous about "what to look for" since Per has so much going on. I sent her home with a stool collection kit to provide a same day sample to us should his diarrhea continue to be > 4 stools a day or worsen in frequency / quality. I asked her to buy a thermometer and start checking his temperatures periodically as well to see if he is having any fevers that would give us more information.   Advised to try to stick to bland diet for now and make sure he drinks enough fluids for now.  

## 2018-01-30 NOTE — Assessment & Plan Note (Signed)
Reviewed D/C summary with his grandmother and he should still be getting the Keppra. Advised to continue this until FU with neurology team.

## 2018-01-30 NOTE — Assessment & Plan Note (Signed)
Shunt has been removed. No signs of increased ICP today with exam. His surgical staples are still in place and he has not had follow up with neurosurgeon. I believe much of his pain is due to these staples but do not want to remove them without being seen by neurosurgery as long as we can get him an appointment in reasonable time frame. I will have him sit with referral coordinator to schedule follow up appt today. Advised to increase tylenol to scheduled 2 times a day with 3rd dose if needed. I explained that I do not want him to receive anything that is altering that could cloud our assessment to determine what is going on with Justin Ball considering he is quite complicated. He does look comfortable on exam today and reports no pain to me when asked.

## 2018-01-30 NOTE — Assessment & Plan Note (Signed)
I am not happy with the drainage from his cath site - I have cleaned the site today and applied a dressing to prevent trauma from underwear and collect drainage. Advised to also not let the catheter pull so tight to avoid trauma and reduce risk for UTI. I am hopeful that the amox-clav will also treat this for him. His grandmother reports that the catheter is also due to be changed and someone will be out to the house this week from home health team to do that.

## 2018-01-30 NOTE — Assessment & Plan Note (Signed)
Abdominal exam benign today. He should be taking the amox-clav x 14 days to continue treatment of these abscesses. He has also not had repeat CT scan yet. I will have him meet with referral coordinator and schedule this exam. Medically necessary to evaluate the un drained fluid collections to ensure proper treatment duration with antibiotics.

## 2018-02-04 ENCOUNTER — Ambulatory Visit: Payer: Medicaid Other | Admitting: Occupational Therapy

## 2018-02-05 ENCOUNTER — Ambulatory Visit (INDEPENDENT_AMBULATORY_CARE_PROVIDER_SITE_OTHER): Payer: Medicaid Other | Admitting: Internal Medicine

## 2018-02-05 ENCOUNTER — Encounter: Payer: Self-pay | Admitting: Internal Medicine

## 2018-02-05 ENCOUNTER — Telehealth: Payer: Self-pay | Admitting: Behavioral Health

## 2018-02-05 DIAGNOSIS — Z982 Presence of cerebrospinal fluid drainage device: Secondary | ICD-10-CM | POA: Diagnosis not present

## 2018-02-05 DIAGNOSIS — Z8661 Personal history of infections of the central nervous system: Secondary | ICD-10-CM

## 2018-02-05 DIAGNOSIS — B180 Chronic viral hepatitis B with delta-agent: Secondary | ICD-10-CM | POA: Diagnosis present

## 2018-02-05 DIAGNOSIS — G40909 Epilepsy, unspecified, not intractable, without status epilepticus: Secondary | ICD-10-CM

## 2018-02-05 DIAGNOSIS — R195 Other fecal abnormalities: Secondary | ICD-10-CM | POA: Diagnosis not present

## 2018-02-05 DIAGNOSIS — B20 Human immunodeficiency virus [HIV] disease: Secondary | ICD-10-CM

## 2018-02-05 DIAGNOSIS — T85730D Infection and inflammatory reaction due to ventricular intracranial (communicating) shunt, subsequent encounter: Secondary | ICD-10-CM

## 2018-02-05 DIAGNOSIS — Z8619 Personal history of other infectious and parasitic diseases: Secondary | ICD-10-CM

## 2018-02-05 NOTE — Assessment & Plan Note (Signed)
He is seeing neurosurgery Thursday for staple removal.

## 2018-02-05 NOTE — Assessment & Plan Note (Signed)
Continuing fluconazole

## 2018-02-05 NOTE — Assessment & Plan Note (Signed)
Now improved.

## 2018-02-05 NOTE — Assessment & Plan Note (Signed)
His viral load has decreased and continues on tenofovir via Odefsey.

## 2018-02-05 NOTE — Assessment & Plan Note (Signed)
I discussed with the GM that he should be on Keppra twice a day instead of 2 once a day.  She will adjust.

## 2018-02-05 NOTE — Assessment & Plan Note (Signed)
Removed.  Unable to get follow up CT of the abdomen so will monitor clinically.  He will finish augmentin next week and stop.  GM will call for any concerns of the abdomen such as swelling, increased pain.

## 2018-02-05 NOTE — Assessment & Plan Note (Signed)
CD4 up.  Will return with Dr. Daiva EvesVan Dam in ab out 2 months and can recheck then.

## 2018-02-05 NOTE — Progress Notes (Signed)
   Subjective:    Patient ID: Justin Ball, male    DOB: 12/28/1990, 27 y.o.   MRN: [id number]  HPI Here for follow up from his recent hospitalization. He had his shunt removed and did have an abscess in the mid pelvic region at the site of the shunt.  He was on Augmentin but only started it last week after not getting it from the pharmacy.  No fever, no chills.  CD4 last month up to 400, viral load remains < 20.  Eating well.  He has not yet had the follow up CT scan of his abdomen to check the abscesses on his abdomen and was denied by medicaid.  He though is having no further abdominal pain or diarrhea at this time.  No associated fever or chills.    his nutrition has improved and he is eating better.  He did require some TNA in the hospital but not needed now.  He had some diarrhea as well but this has resolved.     He also had been taking the Keppra 2 once a day.  Continues on fluconazole secondary prophylaxis and 3 x weekly Bactrim.     Review of Systems  Constitutional: Negative for chills, fatigue and fever.  Eyes: Negative for redness and visual disturbance.  Gastrointestinal: Negative for diarrhea and nausea.  Skin: Negative for rash.  Neurological: Negative for dizziness.       Objective:   Physical Exam  Constitutional: He appears well-developed and well-nourished. No distress.  HENT:  Mouth/Throat: No oropharyngeal exudate.  Staples remain in place, no surrounding erythema, closed incision  Eyes: No scleral icterus.  Abdominal:  Abdomen is soft, no tenderness, no masses at area of abscesses.  No induration.    Genitourinary:  Genitourinary Comments: Suprapubic catheter in place, no drainage, no surrounding erythema  Musculoskeletal: He exhibits no edema.   SH: lives with his GM who is his primary caregiver.        Assessment & Plan:

## 2018-02-05 NOTE — Telephone Encounter (Signed)
[facility] follow up [facility]surgery appointment and to get his staples removed per Dr. Luciana Axeomer.  He is scheduled with Dr. Conchita ParisNundkumar for 02/07/2018 at 10:45am  Patients grandmother and patient understand and states they will be at the appointment.

## 2018-02-13 ENCOUNTER — Ambulatory Visit: Payer: Medicaid Other | Attending: Internal Medicine | Admitting: Occupational Therapy

## 2018-03-01 MED FILL — SULFAMETHOXAZOLE-TMP DS TAB: 800-160 | 30 days supply | Qty: 12 | Fill #1

## 2018-03-20 ENCOUNTER — Other Ambulatory Visit (HOSPITAL_COMMUNITY)
Admission: RE | Admit: 2018-03-20 | Discharge: 2018-03-20 | Disposition: A | Discharge status: Home / Self Care | Payer: Medicaid Other | Source: Ambulatory Visit | Attending: Infectious Disease | Admitting: Infectious Disease

## 2018-03-20 ENCOUNTER — Ambulatory Visit (INDEPENDENT_AMBULATORY_CARE_PROVIDER_SITE_OTHER): Payer: Medicaid Other | Admitting: Infectious Disease

## 2018-03-20 ENCOUNTER — Encounter: Payer: Self-pay | Admitting: Infectious Disease

## 2018-03-20 VITALS — BP 136/77 | HR 99 | Temp 98.3°F | Ht 73.0 in | Wt 181.0 lb

## 2018-03-20 DIAGNOSIS — T85730D Infection and inflammatory reaction due to ventricular intracranial (communicating) shunt, subsequent encounter: Secondary | ICD-10-CM

## 2018-03-20 DIAGNOSIS — G40909 Epilepsy, unspecified, not intractable, without status epilepticus: Secondary | ICD-10-CM

## 2018-03-20 DIAGNOSIS — F0280 Dementia in other diseases classified elsewhere without behavioral disturbance: Secondary | ICD-10-CM | POA: Diagnosis not present

## 2018-03-20 DIAGNOSIS — B20 Human immunodeficiency virus [HIV] disease: Secondary | ICD-10-CM | POA: Insufficient documentation

## 2018-03-20 DIAGNOSIS — R188 Other ascites: Secondary | ICD-10-CM

## 2018-03-20 DIAGNOSIS — G0490 Encephalitis and encephalomyelitis, unspecified: Secondary | ICD-10-CM | POA: Diagnosis not present

## 2018-03-20 MED ORDER — FLUCONAZOLE 200 MG PO TABS: 200.0000 mg | ORAL_TABLET | Freq: Every day | ORAL | 8 refills | Status: DC

## 2018-03-20 NOTE — Progress Notes (Signed)
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   He is on ODEFSEY and Tivicay which he is taking with meals.   Unfortunately since I last saw him he developed a shunt infection that required removal of the shunt.  He also did have an intra-abdominal abscess.  He received antibiotics for this.  There is desire to repeat imaging but his insurance would not cover this.  Therefore we have been following him clinically off antibiotics and he has been doing well without abdominal pain or fevers or chills or headaches.  His HIV is the best controlled it ever has been with a viral load that it remains less than 20 and is not detected.  His CD4 count has risen to 400 which is the highest value we have ever seen with him as he presented with a CD4 count of 40 at the age of 33.  He continues to reside with his grandmother who helps supervise his care and ensure that he takes all of his medications correctly.  He has been approved for disability.  Checks it appears are still going to his mother despite the fact that she is not providing care for the patient.  Justin Ball understandably would prefer for his grandmother to become the designated payee and that was 1 of the main purposes of today's visit in our clinic.  I want her percent agree with the grandmother becoming the payee.  She is providing all the care for her grandson and she is not sure that he is improved to the best I have seen him with regards to his management of his aunt HIV.  Justin Ball is still suffering from fecal incontinence and has to wear diapers.  The incontinence is not as bad as it was in the  past.  He was asking if he could drive again soon.  I told him this was out of the question.  I told him he needed to be patient given how sick he had become and how he had nearly died due to brain herniation and was only saved from death due to a neurosurgical intervention.      Lab Results  Component Value Date   HIV1RNAQUANT <20 01/01/2018   HIV1RNAQUANT <20 NOT DETECTED 11/06/2017   HIV1RNAQUANT <20 DETECTED 10/15/2017   Lab Results  Component Value Date   CD4TABS 400 01/01/2018   CD4TABS 100 (L) 11/06/2017   CD4TABS 120 (L) 10/15/2017   Past Medical History:  Diagnosis Date  . 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 Anxiety   . 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 Bipolar 1 disorder (HCC)   . 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 Cognitive communication deficit    working w/ therapy  . 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 Depression   . 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 Fecal incontinence 11/19/2017  . Foley catheter in place   . 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 History of scabies 2013  . History of syphilis   . 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 Neurogenic bowel    wears depends  . Neuromuscular disorder (HCC)   . 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 Seizure disorder [facility])    neurologist-  dr Marjory Lies ([facility])--caused by cryptococcal meningitis/ multiple  infarctions--  no seizure since hospitalization May 2018  . Status epilepticus (HCC)   . Visuospatial deficit     Past Surgical History:  Procedure Laterality Date  . CYSTOSCOPY N/A 10/10/2017   Procedure: CYSTOSCOPY;  Surgeon: Sebastian Ache, MD;  Location: [facility];  Service: Urology;  Laterality: N/A;  . EXAMINATION UNDER ANESTHESIA N/A 12/28/2014   Procedure: EXAM UNDER ANESTHESIA;  Surgeon: Karie Soda, MD;  Location: [facility];  Service: General;  Laterality: N/A;  . FLOOR OF MOUTH BIOPSY N/A 09/16/2016   Procedure: BIOPSY OF ORAL ABCESS;  Surgeon: Newman Pies, MD;  Location: [facility];  Service: ENT;  Laterality: N/A;  . INCISION AND DRAINAGE ABSCESS N/A 09/16/2016   Procedure: INCISION AND DRAINAGE ABSCESS;  Surgeon: Newman Pies, MD;  Location: [facility];  Service: ENT;  Laterality: N/A;  . INCISION AND DRAINAGE PERIRECTAL ABSCESS  09/ 03/ 2011   dr toth  . INCISION AND DRAINAGE PERIRECTAL ABSCESS N/A 12/28/2014   Procedure: IRRIGATION AND DEBRIDEMENT PERIRECTAL ABSCESS;  Surgeon: Karie Soda, MD;  Location: [facility];  Service: General;  Laterality: N/A;  Fistula repair and ablation  . INSERTION OF SUPRAPUBIC CATHETER N/A 10/10/2017   Procedure: INSERTION OF SUPRAPUBIC CATHETER;  Surgeon: Sebastian Ache, MD;  Location: [facility];  Service: Urology;  Laterality: N/A;  . LAPAROSCOPIC REVISION VENTRICULAR-PERITONEAL (V-P) SHUNT N/A 06/12/2017   Procedure: LAPAROSCOPIC REVISION VENTRICULAR-PERITONEAL (V-P) SHUNT;  Surgeon: Kinsinger, De Blanch, MD;  Location: [facility];  Service: General;  Laterality: N/A;  . SHUNT REMOVAL N/A 01/01/2018   Procedure: REMOVAL OF V-P SHUNT, PLACEMENT OF EVD;  Surgeon: [name], MD;  Location: [facility];  Service: Neurosurgery;  Laterality: N/A;  . TRANSTHORACIC ECHOCARDIOGRAM  06/02/2017   EF 65-70%/  trivial PR  . VENTRICULOPERITONEAL SHUNT N/A 06/12/2017   Procedure: SHUNT INSERTION VENTRICULAR-PERITONEAL;  Surgeon: [name], MD;   Location: [facility];  Service: Neurosurgery;  Laterality: N/A;    Family History  Problem Relation Age of Onset  . Hypertension Other       Social History   Socioeconomic History  . Marital status: Single    Spouse name: Not on file  . Number of children: Not on file  . Years of education: Not on file  . Highest education level: Not on file  Occupational History  . Not on file  Social Needs  . Financial resource strain: Not on file  . Food insecurity:    Worry: Not on file    Inability: Not on file  . Transportation needs:    Medical: Not on file    Non-medical: Not on file  Tobacco Use  . Smoking status: Current Every Day Smoker    Packs/day: 0.50    Years: 5.00    Pack years: 2.50  Types: Cigarettes  . Smokeless tobacco: Never Used  . Tobacco comment: cutting back  Substance and Sexual Activity  . Alcohol use: No    Alcohol/week: 0.0 oz  . Drug use: No  . Sexual activity: Yes    Partners: Male    Birth control/protection: Condom    Comment: irregular condom use; educated  Lifestyle  . Physical activity:    Days per week: Not on file    Minutes per session: Not on file  . Stress: Not on file  Relationships  . Social connections:    Talks on phone: Not on file    Gets together: Not on file    Attends religious service: Not on file    Active member of club or organization: Not on file    Attends meetings of clubs or organizations: Not on file    Relationship status: Not on file  Other Topics Concern  . Not on file  Social History Narrative  . Not on file    Allergies  Allergen Reactions  . Acyclovir And Related      Current Outpatient Medications:  .  acetaminophen (TYLENOL) 325 MG tablet, Take 2 tablets (650 mg total) by mouth every 4 (four) hours as needed for mild pain (temp > 101.5)., Disp: , Rfl:  .  dolutegravir (TIVICAY) 50 MG tablet, Take 1 tablet (50 mg total) by mouth daily. (Patient taking differently: Take 50 mg by mouth every morning. ),  Disp: 30 tablet, Rfl: 6 .  emtricitabine-rilpivir-tenofovir AF (ODEFSEY) 200-25-25 MG TABS tablet, Take 1 tablet by mouth daily with breakfast. (Patient taking differently: Take 1 tablet by mouth daily with breakfast. ), Disp: 30 tablet, Rfl: 6 .  fluconazole (DIFLUCAN) 200 MG tablet, Take 1 tablet (200 mg total) by mouth daily., Disp: 30 tablet, Rfl: 0 .  levETIRAcetam (KEPPRA) 1000 MG tablet, Take 1 tablet (1,000 mg total) by mouth 2 (two) times daily. (Patient taking differently: Take 1,000 mg by mouth 2 (two) times daily. ), Disp: 60 tablet, Rfl: 5 .  QUEtiapine (SEROQUEL) 25 MG tablet, Take 1 tablet (25 mg total) by mouth 2 (two) times daily as needed (agitation). (Patient taking differently: Take 25 mg by mouth 2 (two) times daily. ), Disp: 60 tablet, Rfl: 4 .  sulfamethoxazole-trimethoprim (BACTRIM DS,SEPTRA DS) 800-160 MG tablet, Take 1 tablet by mouth 3 (three) times a week., Disp: 30 tablet, Rfl: 0 .  traMADol (ULTRAM) 50 MG tablet, Take 1-2 tablets (50-100 mg total) by mouth every 6 (six) hours as needed for moderate pain or severe pain. Post-operatively., Disp: 15 tablet, Rfl: 0   Review of Systems  Unable to perform ROS: Dementia       Objective:   Physical Exam  Constitutional: No distress.  HENT:  Head: Normocephalic.    Mouth/Throat: Oropharynx is clear and moist. No oropharyngeal exudate.  Eyes: EOM are normal. No scleral icterus.  Neck: Normal range of motion. No JVD present. No tracheal deviation present.  Cardiovascular: Normal rate and regular rhythm.  Pulmonary/Chest: Effort normal. No respiratory distress. He has no wheezes.  Abdominal: Soft. Bowel sounds are normal. He exhibits no distension and no mass. There is no tenderness. There is no rebound and no guarding.  Musculoskeletal: Normal range of motion. He exhibits no edema or deformity.  Neurological: He is alert. He has normal strength. No cranial nerve deficit or sensory deficit. Gait normal.  Oriented to  person and place and more oriented than in the past  Skin: No rash  noted. He is not diaphoretic. No erythema.  Psychiatric: He has a normal mood and affect. His speech is delayed. He is slowed. Cognition and memory are impaired.        Assessment & Plan:   HIV/AIDS: Continue TIVICAY and ODEFSEY with food Needs fluconazole for crypto  We will check labs today if his CD4 count is still above 200 he can discontinue his Bactrim  Cryptococcal menngoencephalitis: Continue fluconazole with 1 year of immune reconstitution  Seizures: follow closely with Neurology   Intra-abdominal abscess in the context of shunt infection: Evidence is recurring clinically we had wanted to image this but insurance would not cover it.   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   Pertinent letter stating that I support the grandmother becoming the payee  As stated above I think he will continue to have permanent neurological deficits.  From talking to [facility] counselor it sounds like Justin Ball still may have some problems in terms of frontal lobe disinhibition.  Fortunately his anti-viral's are being taken and his HIV virus is well controlled in the context of the structured environment that his grandmother provides for him.  I expect him to be permanently disabled.  I spent greater than 25 minutes with the patient including greater than 50% of time in face to face counsel of the patient and his grandmother, reviewing labs, sizing importance of being adherent to intervertebral therapy and his antifungal therapy and in coordination of his care, occluding composition of the letter which I have written and signed and handed to them.

## 2018-03-21 LAB — COMPLETE METABOLIC PANEL WITH GFR
AG RATIO: 1 (calc) (ref 1.0–2.5)
ALT: 175 U/L — AB (ref 9–46)
AST: 191 U/L — AB (ref 10–40)
Albumin: 4.2 g/dL (ref 3.6–5.1)
Alkaline phosphatase (APISO): 180 U/L — ABNORMAL HIGH (ref 40–115)
BUN: 13 mg/dL (ref 7–25)
CALCIUM: 9.5 mg/dL (ref 8.6–10.3)
CHLORIDE: 105 mmol/L (ref 98–110)
CO2: 22 mmol/L (ref 20–32)
Creat: 0.88 mg/dL (ref 0.60–1.35)
GFR, EST NON AFRICAN AMERICAN: 118 mL/min/{1.73_m2} (ref >=60)
GFR, Est African American: 136 mL/min/{1.73_m2} (ref >=60)
GLOBULIN: 4.4 g/dL — AB (ref 1.9–3.7)
Glucose, Bld: 100 mg/dL — ABNORMAL HIGH (ref 65–99)
Potassium: 4 mmol/L (ref 3.5–5.3)
SODIUM: 140 mmol/L (ref 135–146)
Total Bilirubin: 0.6 mg/dL (ref 0.2–1.2)
Total Protein: 8.6 g/dL — ABNORMAL HIGH (ref 6.1–8.1)

## 2018-03-21 LAB — CBC WITH DIFFERENTIAL/PLATELET
BASOS PCT: 1.8 %
Basophils Absolute: 50 cells/uL (ref 0–200)
EOS ABS: 109 {cells}/uL (ref 15–500)
Eosinophils Relative: 3.9 %
HCT: 44.5 % (ref 38.5–50.0)
HEMOGLOBIN: 16 g/dL (ref 13.2–17.1)
LYMPHS ABS: 1369 {cells}/uL (ref 850–3900)
MCH: 30.7 pg (ref 27.0–33.0)
MCHC: 36 g/dL (ref 32.0–36.0)
MCV: 85.4 fL (ref 80.0–100.0)
MPV: 10.8 fL (ref 7.5–12.5)
Monocytes Relative: 8.9 %
NEUTROS ABS: 1022 {cells}/uL — AB (ref 1500–7800)
Neutrophils Relative %: 36.5 %
Platelets: 389 10*3/uL (ref 140–400)
RBC: 5.21 10*6/uL (ref 4.20–5.80)
RDW: 11.3 % (ref 11.0–15.0)
Total Lymphocyte: 48.9 %
WBC: 2.8 10*3/uL — AB (ref 3.8–10.8)
WBCMIX: 249 {cells}/uL (ref 200–950)

## 2018-03-21 LAB — RPR: RPR Ser Ql: NONREACTIVE

## 2018-03-21 MED FILL — ODEFSEY 200-25-25 MG TABS: 200-25-25 | 30 days supply | Qty: 30 | Fill #6

## 2018-03-22 ENCOUNTER — Telehealth: Payer: Self-pay | Admitting: *Deleted

## 2018-03-22 LAB — T-HELPER CELL (CD4) - (RCID CLINIC ONLY)
CD4 % Helper T Cell: 15 % — ABNORMAL LOW (ref 33–55)
CD4 T Cell Abs: 230 /uL — ABNORMAL LOW (ref 400–2700)

## 2018-03-22 LAB — URINE CYTOLOGY ANCILLARY ONLY
Chlamydia: NEGATIVE
NEISSERIA GONORRHEA: NEGATIVE

## 2018-03-22 NOTE — Telephone Encounter (Signed)
-----  Message from Truman Hayward, MD sent at 03/21/2018 10:12 AM EDT ----- Justin Ball has bump  In AST/ALT and alk phosph are all up. I wonder if he is drinking? He should come back for repeat labs and visit in next week, perhaps with Colletta [place]

## 2018-03-22 NOTE — Telephone Encounter (Signed)
Left message asking Grandmother to bring patient back to office for lab redraw. Andree CossHowell, Marah Park M, RN

## 2018-03-26 LAB — HIV RNA, RTPCR W/R GT (RTI, PI,INT)
HIV 1 RNA Quant: 63 copies/mL — ABNORMAL HIGH
HIV-1 RNA Quant, Log: 1.8 Log copies/mL — ABNORMAL HIGH

## 2018-04-10 ENCOUNTER — Ambulatory Visit: Payer: Medicaid Other | Admitting: Infectious Disease

## 2018-04-12 ENCOUNTER — Encounter: Payer: Self-pay | Admitting: Pharmacist Clinician (PhC)/ Clinical Pharmacy Specialist

## 2018-04-12 ENCOUNTER — Other Ambulatory Visit: Payer: Self-pay | Admitting: Infectious Disease

## 2018-04-12 MED FILL — TIVICAY 50 MG TABLET: 50 | 30 days supply | Qty: 30 | Fill #6

## 2018-04-15 ENCOUNTER — Telehealth: Payer: Self-pay | Admitting: Pharmacist Clinician (PhC)/ Clinical Pharmacy Specialist

## 2018-04-15 NOTE — Telephone Encounter (Signed)
The pharmacy said that Justin Ball may not be taking the Tivicay according to his grandmother. Left a VM for her to call back. He has an appt with Dr Daiva EvesVan Dam on 4/25.

## 2018-04-16 ENCOUNTER — Other Ambulatory Visit: Payer: Self-pay | Admitting: Infectious Disease

## 2018-04-16 ENCOUNTER — Other Ambulatory Visit: Payer: Self-pay

## 2018-04-16 MED ORDER — DOLUTEGRAVIR SODIUM 50 MG PO TABS: 50.0000 mg | ORAL_TABLET | Freq: Every day | ORAL | 6 refills | Status: DC

## 2018-04-18 ENCOUNTER — Ambulatory Visit (INDEPENDENT_AMBULATORY_CARE_PROVIDER_SITE_OTHER): Payer: Medicaid Other | Admitting: Infectious Disease

## 2018-04-18 ENCOUNTER — Encounter: Payer: Self-pay | Admitting: Infectious Disease

## 2018-04-18 VITALS — BP 131/83 | HR 116 | Temp 98.6°F | Ht 73.0 in | Wt 192.0 lb

## 2018-04-18 DIAGNOSIS — B20 Human immunodeficiency virus [HIV] disease: Secondary | ICD-10-CM | POA: Diagnosis not present

## 2018-04-18 DIAGNOSIS — B180 Chronic viral hepatitis B with delta-agent: Secondary | ICD-10-CM

## 2018-04-18 DIAGNOSIS — F0280 Dementia in other diseases classified elsewhere without behavioral disturbance: Secondary | ICD-10-CM

## 2018-04-18 DIAGNOSIS — B451 Cerebral cryptococcosis: Secondary | ICD-10-CM | POA: Diagnosis not present

## 2018-04-18 NOTE — Progress Notes (Signed)
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   He is on ODEFSEY and Tivicay which he is taking with meals.   Unfortunately since I last saw him he developed a shunt infection that required removal of the shunt.  He also did have an intra-abdominal abscess.  He received antibiotics for this.  There is desire to repeat imaging but his insurance would not cover this.  Therefore we have been following him clinically off antibiotics and he has been doing well without abdominal pain or fevers or chills or headaches.  His HIV is the best controlled it ever has been with a viral load that it remains less than 20 and is not detected.  His CD4 count has risen to 400 which is the highest value we have ever seen with him as he presented with a CD4 count of 40 at the age of 1.  He continues to reside with his grandmother who helps supervise his care and ensure that he takes all of his medications correctly.  He has been approved for disability.  Checks it appears are still going to his mother despite the fact that she is not providing care for the patient.  Lindzen understandably would prefer for his grandmother to become the designated payee and that was 1 of the main purposes of today's visit in our clinic.  I wrote him for last time to make sure his grandmother would be coming the payee.       Lab Results  Component Value Date   HIV1RNAQUANT 63 (H) 03/20/2018   HIV1RNAQUANT <20 01/01/2018   HIV1RNAQUANT <20 NOT DETECTED 11/06/2017   Lab Results  Component Value Date   CD4TABS 230 (L) 03/20/2018   CD4TABS 400 01/01/2018   CD4TABS 100 (L)  11/06/2017    Recently it was concerning that Lindzen may have been taking the ODEFSEY without his TIVICAY.  His grandmother I believe cannot read may been part of the issue he was not accompanied by his grandmother today but instead by his boyfriend who is also a patient in the clinic here.  He did have all of his medications and I showed him in the boyfriend which medications he needed to take religiously for his HIV family is TIVICAY and ODEFSEY and to the fluconazole that he needs to take religiously for his trip to [place] meningitis.  Past Medical History:  Diagnosis Date  . 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 Anxiety   . 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 Bipolar 1 disorder (HCC)   . 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 Cognitive communication deficit    working w/ therapy  . 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 Depression   . 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 Fecal incontinence 11/19/2017  . Foley catheter in place   .  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 History of scabies 2013  . History of syphilis   . 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 Neurogenic bowel    wears depends  . Neuromuscular disorder (HCC)   . 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 Seizure disorder [facility])    neurologist-  dr Marjory Lies ([facility])--caused by cryptococcal meningitis/ multiple infarctions--   no seizure since hospitalization May 2018  . Status epilepticus (HCC)   . Visuospatial deficit     Past Surgical History:  Procedure Laterality Date  . CYSTOSCOPY N/A 10/10/2017   Procedure: CYSTOSCOPY;  Surgeon: Sebastian Ache, MD;  Location: [facility];  Service: Urology;  Laterality: N/A;  . EXAMINATION UNDER ANESTHESIA N/A 12/28/2014   Procedure: EXAM UNDER ANESTHESIA;  Surgeon: Karie Soda, MD;  Location: [facility];  Service: General;  Laterality: N/A;  . FLOOR OF MOUTH BIOPSY N/A 09/16/2016   Procedure: BIOPSY OF ORAL ABCESS;  Surgeon: Newman Pies, MD;  Location: [facility];  Service: ENT;  Laterality: N/A;  . INCISION AND DRAINAGE ABSCESS N/A 09/16/2016   Procedure: INCISION AND DRAINAGE ABSCESS;  Surgeon: Newman Pies, MD;  Location: [facility];  Service: ENT;  Laterality: N/A;  . INCISION AND DRAINAGE PERIRECTAL ABSCESS  09/ 03/ 2011   dr toth  . INCISION AND DRAINAGE PERIRECTAL ABSCESS N/A 12/28/2014   Procedure: IRRIGATION AND DEBRIDEMENT PERIRECTAL ABSCESS;  Surgeon: Karie Soda, MD;  Location: [facility];  Service: General;  Laterality: N/A;  Fistula repair and ablation  . INSERTION OF SUPRAPUBIC CATHETER N/A 10/10/2017   Procedure: INSERTION OF SUPRAPUBIC CATHETER;  Surgeon: Sebastian Ache, MD;  Location: [facility];  Service: Urology;  Laterality: N/A;  . LAPAROSCOPIC REVISION VENTRICULAR-PERITONEAL (V-P) SHUNT N/A 06/12/2017   Procedure: LAPAROSCOPIC REVISION VENTRICULAR-PERITONEAL (V-P) SHUNT;  Surgeon: Kinsinger, De Blanch, MD;  Location[facility];  Service: General;  Laterality: N/A;  . SHUNT REMOVAL N/A 01/01/2018   Procedure: REMOVAL OF V-P SHUNT, PLACEMENT OF EVD;  Surgeon: [name], MD;  Location[facility];  Service: Neurosurgery;  Laterality: N/A;  . TRANSTHORACIC ECHOCARDIOGRAM  06/02/2017   EF 65-70%/  trivial PR  . VENTRICULOPERITONEAL SHUNT N/A 06/12/2017   Procedure: SHUNT INSERTION VENTRICULAR-PERITONEAL;  Surgeon: [name], MD;  [facility];  Service: Neurosurgery;  Laterality: N/A;    Family History  Problem Relation Age of Onset  . Hypertension Other       Social History   Socioeconomic History  . Marital status: Single    Spouse name: Not on file  . Number of children: Not on file  . Years of education: Not on file  . Highest education level: Not on file  Occupational History  . Not on file  Social Needs  . Financial resource strain: Not on file  . Food insecurity:    Worry: Not on file    Inability: Not on file  . Transportation needs:    Medical: Not on file    Non-medical: Not on file  Tobacco Use  . Smoking status: Current Every Day Smoker    Packs/day: 0.50    Years: 5.00    Pack years: 2.50    Types: Cigarettes  . Smokeless tobacco: Never Used  . Tobacco comment:  cutting back  Substance and Sexual Activity  . Alcohol use: No    Alcohol/week: 0.0 oz  . Drug use: No  . Sexual activity: Yes    Partners: Male    Birth control/protection: Condom    Comment: irregular condom use; educated  Lifestyle  . Physical activity:    Days per week: Not on file    Minutes per session: Not on file  . Stress: Not on file  Relationships  . Social connections:    Talks on phone: Not on file    Gets together: Not on file    Attends religious service: Not on file    Active member of club or organization: Not on file    Attends meetings of clubs or organizations: Not on file    Relationship status: Not on file  Other Topics Concern  . Not on file  Social History Narrative  . Not on file    Allergies  Allergen Reactions  . Acyclovir And Related      Current Outpatient Medications:  .  acetaminophen (TYLENOL) 325 MG tablet, Take 2 tablets (650 mg total) by mouth every 4 (four) hours as needed for mild pain (temp > 101.5)., Disp: , Rfl:  .  dolutegravir (TIVICAY) 50 MG tablet, Take 1 tablet (50 mg total) by mouth daily., Disp: 30 tablet, Rfl: 6 .  emtricitabine-rilpivir-tenofovir AF (ODEFSEY)  200-25-25 MG TABS tablet, Take 1 tablet by mouth daily with breakfast. (Patient taking differently: Take 1 tablet by mouth daily with breakfast. ), Disp: 30 tablet, Rfl: 6 .  fluconazole (DIFLUCAN) 200 MG tablet, Take 1 tablet (200 mg total) by mouth daily., Disp: 30 tablet, Rfl: 8 .  levETIRAcetam (KEPPRA) 1000 MG tablet, Take 1 tablet (1,000 mg total) by mouth 2 (two) times daily. (Patient taking differently: Take 1,000 mg by mouth 2 (two) times daily. ), Disp: 60 tablet, Rfl: 5 .  ODEFSEY 200-25-25 MG TABS tablet, TAKE 1 TABLET BY MOUTH DAILY WITH BREAKFAST., Disp: 30 tablet, Rfl: 6 .  QUEtiapine (SEROQUEL) 25 MG tablet, Take 1 tablet (25 mg total) by mouth 2 (two) times daily as needed (agitation). (Patient taking differently: Take 25 mg by mouth 2 (two) times daily. ), Disp: 60 tablet, Rfl: 4 .  sulfamethoxazole-trimethoprim (BACTRIM DS,SEPTRA DS) 800-160 MG tablet, Take 1 tablet by mouth 3 (three) times a week., Disp: 30 tablet, Rfl: 0 .  traMADol (ULTRAM) 50 MG tablet, Take 1-2 tablets (50-100 mg total) by mouth every 6 (six) hours as needed for moderate pain or severe pain. Post-operatively., Disp: 15 tablet, Rfl: 0   Review of Systems  Unable to perform ROS: Dementia       Objective:   Physical Exam  Constitutional: No distress.  HENT:  Head: Normocephalic.    Mouth/Throat: Oropharynx is clear and moist. No oropharyngeal exudate.  Eyes: EOM are normal. No scleral icterus.  Neck: Normal range of motion. No JVD present. No tracheal deviation present.  Cardiovascular: Normal rate and regular rhythm.  Pulmonary/Chest: Effort normal. No respiratory distress. He has no wheezes.  Abdominal: Soft. Bowel sounds are normal. He exhibits no distension and no mass. There is no tenderness. There is no rebound and no guarding.  Musculoskeletal: Normal range of motion. He exhibits no edema or deformity.  Neurological: He is alert. He has normal strength. No cranial nerve deficit or sensory  deficit. Gait normal.  Oriented to person and place and more oriented than in the past  Skin: No rash noted. He is  not diaphoretic. No erythema.  Psychiatric: He has a normal mood and affect. His speech is delayed. He is slowed. Cognition and memory are impaired.        Assessment & Plan:   HIV/AIDS: Continue TIVICAY and ODEFSEY with food and check VL and CD4 today, he can stop bactrim  Needs fluconazole for crypto rim  Cryptococcal menngoencephalitis: Continue fluconazole with 1 year of immune reconstitution  Seizures: follow closely with Neurol   CNS damage from cryptococcal infection +/- HIV disease of brain itself:

## 2018-04-19 LAB — T-HELPER CELL (CD4) - (RCID CLINIC ONLY)
CD4 % Helper T Cell: 13 % — ABNORMAL LOW (ref 33–55)
CD4 T Cell Abs: 240 /uL — ABNORMAL LOW (ref 400–2700)

## 2018-04-20 ENCOUNTER — Encounter (HOSPITAL_COMMUNITY): Payer: Self-pay | Admitting: Emergency Medicine

## 2018-04-20 ENCOUNTER — Emergency Department (HOSPITAL_COMMUNITY)
Admission: EM | Admit: 2018-04-20 | Discharge: 2018-04-20 | Disposition: A | Discharge status: Home / Self Care | Payer: Medicaid Other | Attending: Emergency Medicine | Admitting: Emergency Medicine

## 2018-04-20 ENCOUNTER — Other Ambulatory Visit: Payer: Self-pay

## 2018-04-20 ENCOUNTER — Emergency Department (HOSPITAL_COMMUNITY): Payer: Medicaid Other

## 2018-04-20 DIAGNOSIS — R1030 Lower abdominal pain, unspecified: Secondary | ICD-10-CM | POA: Diagnosis present

## 2018-04-20 DIAGNOSIS — N3000 Acute cystitis without hematuria: Secondary | ICD-10-CM | POA: Insufficient documentation

## 2018-04-20 DIAGNOSIS — Z9359 Other cystostomy status: Secondary | ICD-10-CM | POA: Insufficient documentation

## 2018-04-20 DIAGNOSIS — Z960 Presence of urogenital implants: Secondary | ICD-10-CM | POA: Insufficient documentation

## 2018-04-20 DIAGNOSIS — B20 Human immunodeficiency virus [HIV] disease: Secondary | ICD-10-CM | POA: Insufficient documentation

## 2018-04-20 DIAGNOSIS — F17210 Nicotine dependence, cigarettes, uncomplicated: Secondary | ICD-10-CM | POA: Insufficient documentation

## 2018-04-20 DIAGNOSIS — Z8673 Personal history of transient ischemic attack (TIA), and cerebral infarction without residual deficits: Secondary | ICD-10-CM | POA: Insufficient documentation

## 2018-04-20 DIAGNOSIS — Z79899 Other long term (current) drug therapy: Secondary | ICD-10-CM | POA: Insufficient documentation

## 2018-04-20 LAB — CBC WITH DIFFERENTIAL/PLATELET
BASOS PCT: 1 %
Basophils Absolute: 0 10*3/uL (ref 0.0–0.1)
EOS ABS: 0.1 10*3/uL (ref 0.0–0.7)
EOS PCT: 2 %
HCT: 41.7 % (ref 39.0–52.0)
Hemoglobin: 14.4 g/dL (ref 13.0–17.0)
LYMPHS ABS: 1.9 10*3/uL (ref 0.7–4.0)
Lymphocytes Relative: 31 %
MCH: 31.4 pg (ref 26.0–34.0)
MCHC: 34.5 g/dL (ref 30.0–36.0)
MCV: 91 fL (ref 78.0–100.0)
MONOS PCT: 10 %
Monocytes Absolute: 0.6 10*3/uL (ref 0.1–1.0)
Neutro Abs: 3.5 10*3/uL (ref 1.7–7.7)
Neutrophils Relative %: 56 %
PLATELETS: 286 10*3/uL (ref 150–400)
RBC: 4.58 MIL/uL (ref 4.22–5.81)
RDW: 13 % (ref 11.5–15.5)
WBC: 6.1 10*3/uL (ref 4.0–10.5)

## 2018-04-20 LAB — BASIC METABOLIC PANEL
Anion gap: 11 (ref 5–15)
BUN: 14 mg/dL (ref 6–20)
CALCIUM: 9.1 mg/dL (ref 8.9–10.3)
CHLORIDE: 104 mmol/L (ref 101–111)
CO2: 22 mmol/L (ref 22–32)
CREATININE: 0.93 mg/dL (ref 0.61–1.24)
GFR calc non Af Amer: >60 mL/min (ref >60)
Glucose, Bld: 96 mg/dL (ref 65–99)
Potassium: 3.6 mmol/L (ref 3.5–5.1)
SODIUM: 137 mmol/L (ref 135–145)

## 2018-04-20 LAB — URINALYSIS, ROUTINE W REFLEX MICROSCOPIC
Bilirubin Urine: NEGATIVE
GLUCOSE, UA: NEGATIVE mg/dL
Ketones, ur: NEGATIVE mg/dL
NITRITE: NEGATIVE
PH: 6 (ref 5.0–8.0)
Protein, ur: 100 mg/dL — AB
RBC / HPF: >50 RBC/hpf — ABNORMAL HIGH (ref 0–5)
SPECIFIC GRAVITY, URINE: 1.023 (ref 1.005–1.030)
WBC, UA: >50 WBC/hpf — ABNORMAL HIGH (ref 0–5)

## 2018-04-20 MED ORDER — SODIUM CHLORIDE 0.9 % IV BOLUS
1000.0000 mL | Freq: Once | INTRAVENOUS | Status: AC
  Administered 2018-04-20: 1000 mL via INTRAVENOUS

## 2018-04-20 MED ORDER — MORPHINE SULFATE (PF) 4 MG/ML IV SOLN
4.0000 mg | Freq: Once | INTRAVENOUS | Status: AC
  Administered 2018-04-20: 4 mg via INTRAVENOUS
  Filled 2018-04-20: qty 1

## 2018-04-20 MED ORDER — LEVOFLOXACIN IN D5W 750 MG/150ML IV SOLN
750.0000 mg | Freq: Once | INTRAVENOUS | Status: AC
  Administered 2018-04-20: 750 mg via INTRAVENOUS
  Filled 2018-04-20: qty 150

## 2018-04-20 MED ORDER — IOPAMIDOL (ISOVUE-300) INJECTION 61%
INTRAVENOUS | Status: AC
  Filled 2018-04-20: qty 100

## 2018-04-20 MED ORDER — LEVOFLOXACIN 750 MG PO TABS: 750.0000 mg | ORAL_TABLET | Freq: Every day | ORAL | 0 refills | Status: DC

## 2018-04-20 MED ORDER — IOPAMIDOL (ISOVUE-300) INJECTION 61%
100.0000 mL | Freq: Once | INTRAVENOUS | Status: AC | PRN
  Administered 2018-04-20: 100 mL via INTRAVENOUS

## 2018-04-20 NOTE — ED Triage Notes (Signed)
Patient complaining of urinating out of his penis instead of suprapubic catheter. Patient states this has been going on for 2 days.

## 2018-04-20 NOTE — ED Provider Notes (Signed)
[facility]-EMERGENCY DEPT Provider Note   CSN: [id number] Arrival date & time: 04/20/18  0119     History   Chief Complaint Chief Complaint  Patient presents with  . suprapubic catheter problem    HPI Justin Ball is a 27 y.o. male.  Patient is a 27 year old male with past medical history of HIV disease, bipolar disorder, neurogenic bladder with suprapubic catheter in place.  He presents for evaluation of pain in his suprapubic region.  This is been ongoing for the past several days.  He denies any fevers or chills.  He denies any bowel complaints.  He denies any aggravating or alleviating factors.  The history is provided by the patient.    Past Medical History:  Diagnosis Date  . 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 Anxiety   . 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 Bipolar 1 disorder (HCC)   . 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 Cognitive communication deficit    working w/ therapy  . 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 Depression   . 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 Fecal incontinence 11/19/2017  . Foley catheter in place   . 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 History of scabies 2013  . History of syphilis   . 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 Neurogenic bowel    wears depends  . Neuromuscular disorder (HCC)   . S/P VP shunt  06/12/2017  . Seizure disorder [facility])    neurologist-  dr Marjory Lies ([facility])--caused by cryptococcal meningitis/ multiple infarctions--  no seizure since hospitalization May 2018  . Status epilepticus (HCC)   . Visuospatial deficit     Patient Active Problem List   Diagnosis Date Noted  . Suprapubic catheter (HCC) 01/30/2018  . Loose stools 01/30/2018  . Intraabdominal fluid collection   . Infection of ventricular shunt (HCC) 12/31/2017  . Fecal incontinence 11/19/2017  . Seizure disorder (HCC) 09/12/2017  . 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 History of syphilis   . Tobacco abuse   . Cryptococcal meningoencephalitis (HCC)   . History of cryptococcal meningitis   . 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 Anemia 09/12/2016  . 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 Bipolar disorder (HCC) 04/08/2013    Past Surgical History:  Procedure Laterality Date  . CYSTOSCOPY N/A 10/10/2017   Procedure: CYSTOSCOPY;  Surgeon: Sebastian Ache, MD;  Location: [facility];  Service: Urology;  Laterality: N/A;  . EXAMINATION UNDER ANESTHESIA N/A 12/28/2014   Procedure: EXAM UNDER ANESTHESIA;  Surgeon: Karie Soda, MD;  Location[facility];  Service: General;  Laterality: N/A;  . FLOOR OF MOUTH BIOPSY N/A 09/16/2016   Procedure: BIOPSY OF ORAL ABCESS;  Surgeon: Newman Pies, MD;  [facility];  Service:  ENT;  Laterality: N/A;  . INCISION AND DRAINAGE ABSCESS N/A 09/16/2016   Procedure: INCISION AND DRAINAGE ABSCESS;  Surgeon: Newman Pies, MD;  [facility];  Service: ENT;  Laterality: N/A;  . INCISION AND DRAINAGE PERIRECTAL ABSCESS  09/ 03/ 2011   dr toth  . INCISION AND DRAINAGE PERIRECTAL ABSCESS N/A 12/28/2014   Procedure: IRRIGATION AND DEBRIDEMENT PERIRECTAL ABSCESS;  Surgeon: Karie Soda, MD;  Location: [facility];  Service: General;  Laterality: N/A;  Fistula repair and  ablation  . INSERTION OF SUPRAPUBIC CATHETER N/A 10/10/2017   Procedure: INSERTION OF SUPRAPUBIC CATHETER;  Surgeon: Sebastian Ache, MD;  Location: [facility];  Service: Urology;  Laterality: N/A;  . LAPAROSCOPIC REVISION VENTRICULAR-PERITONEAL (V-P) SHUNT N/A 06/12/2017   Procedure: LAPAROSCOPIC REVISION VENTRICULAR-PERITONEAL (V-P) SHUNT;  Surgeon: Kinsinger, De Blanch, MD;  [facility];  Service: General;  Laterality: N/A;  . SHUNT REMOVAL N/A 01/01/2018   Procedure: REMOVAL OF V-P SHUNT, PLACEMENT OF EVD;  Surgeon: [name], MD;  [facility];  Service: Neurosurgery;  Laterality: N/A;  . TRANSTHORACIC ECHOCARDIOGRAM  06/02/2017   EF 65-70%/  trivial PR  . VENTRICULOPERITONEAL SHUNT N/A 06/12/2017   Procedure: SHUNT INSERTION VENTRICULAR-PERITONEAL;  Surgeon: [name], MD;  [facility];  Service: Neurosurgery;  Laterality: N/A;        Home Medications    Prior to Admission medications   Medication Sig Start Date End Date Taking? Authorizing Provider  acetaminophen (TYLENOL) 325 MG tablet Take 2 tablets (650 mg total) by mouth every 4 (four) hours as needed for mild pain (temp > 101.5). 06/28/17   Cliffton Asters, MD  dolutegravir (TIVICAY) 50 MG tablet Take 1 tablet (50 mg total) by mouth daily. 04/16/18   Randall Hiss, MD  emtricitabine-rilpivir-tenofovir AF (ODEFSEY) 200-25-25 MG TABS tablet Take 1 tablet by mouth daily with breakfast. Patient taking differently: Take 1 tablet by mouth daily with breakfast.  07/20/17   [name], Lisette Grinder, MD  fluconazole (DIFLUCAN) 200 MG tablet Take 1 tablet (200 mg total) by mouth daily. 03/20/18   Randall Hiss, MD  levETIRAcetam (KEPPRA) 1000 MG tablet Take 1 tablet (1,000 mg total) by mouth 2 (two) times daily. Patient taking differently: Take 1,000 mg by mouth 2 (two) times daily.  07/20/17   Randall Hiss, MD  QUEtiapine (SEROQUEL) 25 MG tablet Take 1 tablet (25 mg total) by mouth 2  (two) times daily as needed (agitation). 07/20/17   Randall Hiss, MD    Family History Family History  Problem Relation Age of Onset  . Hypertension Other     Social History Social History   Tobacco Use  . Smoking status: Current Every Day Smoker    Packs/day: 0.50    Years: 5.00    Pack years: 2.50    Types: Cigarettes  . Smokeless tobacco: Never Used  . Tobacco comment: cutting back  Substance Use Topics  . Alcohol use: No    Alcohol/week: 0.0 oz  . Drug use: No     Allergies   Acyclovir and related   Review of Systems Review of Systems  All other systems reviewed and are negative.    Physical Exam Updated Vital Signs BP 134/90 (BP Location: Left Arm)   Pulse (!) 111   Temp 98.4 F (36.9 C) (Oral)   Resp 18   Ht  (1.854 m)   Wt 87.1 kg (192 lb)   SpO2 100%  BMI 25.33 kg/m   Physical Exam  Constitutional: He is oriented to person, place, and time. He appears well-developed and well-nourished. No distress.  HENT:  Head: Normocephalic and atraumatic.  Mouth/Throat: Oropharynx is clear and moist.  Neck: Normal range of motion. Neck supple.  Cardiovascular: Normal rate and regular rhythm. Exam reveals no friction rub.  No murmur heard. Pulmonary/Chest: Effort normal and breath sounds normal. No respiratory distress. He has no wheezes. He has no rales.  Abdominal: Soft. Bowel sounds are normal. He exhibits no distension. There is tenderness. There is no rebound and no guarding.  There is tenderness to palpation in the suprapubic region.  The suprapubic catheter is in place.  There is slight drainage surrounding it.  Musculoskeletal: Normal range of motion. He exhibits no edema.  Neurological: He is alert and oriented to person, place, and time. Coordination normal.  Skin: Skin is warm and dry. He is not diaphoretic.  Nursing note and vitals reviewed.    ED Treatments / Results  Labs (all labs ordered are listed, but only abnormal results  are displayed) Labs Reviewed  BASIC METABOLIC PANEL  CBC WITH DIFFERENTIAL/PLATELET  URINALYSIS, ROUTINE W REFLEX MICROSCOPIC    EKG None  Radiology No results found.  Procedures Procedures (including critical care time)  Medications Ordered in ED Medications  sodium chloride 0.9 % bolus 1,000 mL (has no administration in time range)  morphine 4 MG/ML injection 4 mg (has no administration in time range)     Initial Impression / Assessment and Plan / ED Course  I have reviewed the triage vital signs and the nursing notes.  Pertinent labs & imaging results that were available during my care of the patient were reviewed by me and considered in my medical decision making (see chart for details).  Urinalysis shows infection.  I suspect this to be the cause of his discomfort.  He was given IV Levaquin and will be discharged with the same.  There are also signs of constipation.  I have recommended magnesium citrate for this.  He is non-toxic appearing and in no distress.    Final Clinical Impressions(s) / ED Diagnoses   Final diagnoses:  None    ED Discharge Orders    None       Geoffery Lyons, MD 04/20/18 2322

## 2018-04-20 NOTE — Discharge Instructions (Addendum)
Levaquin as prescribed.  Magnesium Citrate:  Drink the entire 10 ounce bottle mixed with equal parts Sprite or Gatorade for relief of constipation.  Follow up with your Urologist in the next 3-4 days, and return to the ED if symptoms worsen or change.

## 2018-04-22 ENCOUNTER — Other Ambulatory Visit: Payer: Self-pay | Admitting: Pharmacist

## 2018-04-25 LAB — HIV RNA, RTPCR W/R GT (RTI, PI,INT)
HIV 1 RNA Quant: 35 copies/mL — ABNORMAL HIGH
HIV-1 RNA Quant, Log: 1.54 Log copies/mL — ABNORMAL HIGH

## 2018-05-08 ENCOUNTER — Telehealth: Payer: Self-pay | Admitting: Pharmacist Clinician (PhC)/ Clinical Pharmacy Specialist

## 2018-05-08 NOTE — Telephone Encounter (Signed)
The pharmacy said that there may be some issue with Justin Ball getting his meds. Left his grandmother a VM to call back to see about the issue.

## 2018-05-22 ENCOUNTER — Telehealth: Payer: Self-pay | Admitting: Pharmacist Clinician (PhC)/ Clinical Pharmacy Specialist

## 2018-05-22 NOTE — Telephone Encounter (Signed)
Called to check on medication administration issue for Justin Ball but can't leave VM.

## 2018-05-29 ENCOUNTER — Other Ambulatory Visit: Payer: Self-pay | Admitting: Pharmacist Clinician (PhC)/ Clinical Pharmacy Specialist

## 2018-06-24 MED FILL — TIVICAY 50 MG TABLET: 50 | 30 days supply | Qty: 30 | Fill #0

## 2018-06-24 MED FILL — FLUCONAZOLE 200 MG TAB: 200 | 30 days supply | Qty: 30 | Fill #0

## 2018-06-24 MED FILL — ODEFSEY 200-25-25 MG TABS: 200-25-25 | 30 days supply | Qty: 30 | Fill #0

## 2018-06-24 MED FILL — QUETIAPINE 25 MG TABLET: 25 | 30 days supply | Qty: 60 | Fill #3

## 2018-06-24 MED FILL — levETIRAcetam 1000 MG TABS: 1000 | 30 days supply | Qty: 60 | Fill #4

## 2018-08-30 ENCOUNTER — Telehealth: Payer: Self-pay | Admitting: Pharmacy Technician

## 2018-08-30 NOTE — Telephone Encounter (Signed)
I sent the following staff message to Dr. Tommy Medal, Cassie Kuppelweiser and Claudell Kyle...  [facility] pharmacy has tried repeatedly to reach Crowell without success.  I spoke with his grandmother Collie Siad today and she is very concerned about Thaxton.  She states he met a friend online and moved to be with the friend 3 weeks ago in [place].    He has been without meds that long because he did not take any of his meds with him.  She feels the "friend" is using [employer] for the support money he gets.  She is asking for help to find him.  Do we, as a clinic have any options in helping her to find him?

## 2018-09-23 ENCOUNTER — Ambulatory Visit: Payer: Self-pay | Admitting: *Deleted

## 2018-09-23 ENCOUNTER — Other Ambulatory Visit: Payer: Self-pay | Admitting: Pharmacist

## 2018-09-23 DIAGNOSIS — B20 Human immunodeficiency virus [HIV] disease: Secondary | ICD-10-CM

## 2018-09-23 DIAGNOSIS — G0490 Encephalitis and encephalomyelitis, unspecified: Secondary | ICD-10-CM

## 2018-09-23 DIAGNOSIS — F0280 Dementia in other diseases classified elsewhere without behavioral disturbance: Secondary | ICD-10-CM

## 2018-09-23 DIAGNOSIS — Z8661 Personal history of infections of the central nervous system: Secondary | ICD-10-CM

## 2018-09-23 DIAGNOSIS — Z982 Presence of cerebrospinal fluid drainage device: Secondary | ICD-10-CM

## 2018-09-23 DIAGNOSIS — G40909 Epilepsy, unspecified, not intractable, without status epilepticus: Secondary | ICD-10-CM

## 2018-09-23 DIAGNOSIS — Z8619 Personal history of other infectious and parasitic diseases: Secondary | ICD-10-CM

## 2018-09-23 MED ORDER — LEVETIRACETAM 1000 MG PO TABS: 1000.0000 mg | ORAL_TABLET | Freq: Two times a day (BID) | ORAL | 5 refills | Status: DC

## 2018-09-23 MED ORDER — QUETIAPINE FUMARATE 25 MG PO TABS: 25.0000 mg | ORAL_TABLET | Freq: Two times a day (BID) | ORAL | 4 refills | Status: DC | PRN

## 2018-09-23 MED ORDER — DOLUTEGRAVIR SODIUM 50 MG PO TABS: 50.0000 mg | ORAL_TABLET | Freq: Every day | ORAL | 6 refills | Status: DC

## 2018-09-24 ENCOUNTER — Ambulatory Visit: Payer: Medicaid Other | Admitting: Pharmacy Technician

## 2018-09-24 MED FILL — levETIRAcetam 1000 MG TABS: 1000 | 30 days supply | Qty: 60 | Fill #0

## 2018-09-24 MED FILL — TIVICAY 50 MG TABLET: 50 | 30 days supply | Qty: 30 | Fill #0

## 2018-09-24 MED FILL — FLUCONAZOLE 200 MG TAB: 200 | 30 days supply | Qty: 30 | Fill #1

## 2018-09-24 MED FILL — QUETIAPINE 25 MG TABLET: 25 | 30 days supply | Qty: 60 | Fill #0

## 2018-09-24 MED FILL — ODEFSEY 200-25-25 MG TABS: 200-25-25 | 30 days supply | Qty: 30 | Fill #1

## 2018-09-24 NOTE — Progress Notes (Signed)
This is a very difficult situation Timo's Mom from what I recall was NOT helpful at all with keeping Tran on task with taking his meds. I have not known before of wrong doing or 2ndary gain re the gradnmother but I obviously can only judge what is in front of me in clinic.  I continue to doubt that Kathan is competent. His life will soon be in danger if he does not get back onto meds promptly
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
## 2018-09-28 ENCOUNTER — Inpatient Hospital Stay (HOSPITAL_COMMUNITY)
Admission: EM | Admit: 2018-09-28 | Discharge: 2018-10-14 | DRG: 974 | Disposition: A | Discharge status: Home / Self Care | Payer: Medicaid Other | Attending: Family Medicine | Admitting: Family Medicine

## 2018-09-28 ENCOUNTER — Encounter (HOSPITAL_COMMUNITY): Payer: Self-pay

## 2018-09-28 ENCOUNTER — Emergency Department (HOSPITAL_COMMUNITY): Payer: Medicaid Other

## 2018-09-28 ENCOUNTER — Other Ambulatory Visit: Payer: Self-pay

## 2018-09-28 DIAGNOSIS — G9341 Metabolic encephalopathy: Secondary | ICD-10-CM | POA: Diagnosis present

## 2018-09-28 DIAGNOSIS — B1920 Unspecified viral hepatitis C without hepatic coma: Secondary | ICD-10-CM | POA: Diagnosis not present

## 2018-09-28 DIAGNOSIS — L299 Pruritus, unspecified: Secondary | ICD-10-CM | POA: Diagnosis not present

## 2018-09-28 DIAGNOSIS — B179 Acute viral hepatitis, unspecified: Secondary | ICD-10-CM | POA: Diagnosis not present

## 2018-09-28 DIAGNOSIS — D649 Anemia, unspecified: Secondary | ICD-10-CM | POA: Diagnosis present

## 2018-09-28 DIAGNOSIS — R17 Unspecified jaundice: Secondary | ICD-10-CM | POA: Diagnosis not present

## 2018-09-28 DIAGNOSIS — F319 Bipolar disorder, unspecified: Secondary | ICD-10-CM | POA: Diagnosis present

## 2018-09-28 DIAGNOSIS — E43 Unspecified severe protein-calorie malnutrition: Secondary | ICD-10-CM | POA: Diagnosis present

## 2018-09-28 DIAGNOSIS — G40909 Epilepsy, unspecified, not intractable, without status epilepticus: Secondary | ICD-10-CM | POA: Diagnosis present

## 2018-09-28 DIAGNOSIS — D689 Coagulation defect, unspecified: Secondary | ICD-10-CM | POA: Diagnosis present

## 2018-09-28 DIAGNOSIS — I959 Hypotension, unspecified: Secondary | ICD-10-CM | POA: Diagnosis not present

## 2018-09-28 DIAGNOSIS — Z881 Allergy status to other antibiotic agents status: Secondary | ICD-10-CM | POA: Diagnosis not present

## 2018-09-28 DIAGNOSIS — Z9114 Patient's other noncompliance with medication regimen: Secondary | ICD-10-CM

## 2018-09-28 DIAGNOSIS — B451 Cerebral cryptococcosis: Secondary | ICD-10-CM

## 2018-09-28 DIAGNOSIS — B1910 Unspecified viral hepatitis B without hepatic coma: Secondary | ICD-10-CM | POA: Diagnosis not present

## 2018-09-28 DIAGNOSIS — E7220 Disorder of urea cycle metabolism, unspecified: Secondary | ICD-10-CM | POA: Diagnosis not present

## 2018-09-28 DIAGNOSIS — Z8661 Personal history of infections of the central nervous system: Secondary | ICD-10-CM

## 2018-09-28 DIAGNOSIS — N179 Acute kidney failure, unspecified: Secondary | ICD-10-CM | POA: Diagnosis not present

## 2018-09-28 DIAGNOSIS — R519 Headache, unspecified: Secondary | ICD-10-CM

## 2018-09-28 DIAGNOSIS — R9431 Abnormal electrocardiogram [ECG] [EKG]: Secondary | ICD-10-CM | POA: Diagnosis not present

## 2018-09-28 DIAGNOSIS — Z91148 Patient's other noncompliance with medication regimen for other reason: Secondary | ICD-10-CM

## 2018-09-28 DIAGNOSIS — R1110 Vomiting, unspecified: Secondary | ICD-10-CM | POA: Diagnosis not present

## 2018-09-28 DIAGNOSIS — B181 Chronic viral hepatitis B without delta-agent: Secondary | ICD-10-CM | POA: Diagnosis present

## 2018-09-28 DIAGNOSIS — Z21 Asymptomatic human immunodeficiency virus [HIV] infection status: Secondary | ICD-10-CM

## 2018-09-28 DIAGNOSIS — R112 Nausea with vomiting, unspecified: Secondary | ICD-10-CM | POA: Diagnosis not present

## 2018-09-28 DIAGNOSIS — F17210 Nicotine dependence, cigarettes, uncomplicated: Secondary | ICD-10-CM | POA: Diagnosis present

## 2018-09-28 DIAGNOSIS — R768 Other specified abnormal immunological findings in serum: Secondary | ICD-10-CM | POA: Diagnosis not present

## 2018-09-28 DIAGNOSIS — K529 Noninfective gastroenteritis and colitis, unspecified: Secondary | ICD-10-CM | POA: Diagnosis not present

## 2018-09-28 DIAGNOSIS — B169 Acute hepatitis B without delta-agent and without hepatic coma: Secondary | ICD-10-CM | POA: Diagnosis present

## 2018-09-28 DIAGNOSIS — F419 Anxiety disorder, unspecified: Secondary | ICD-10-CM | POA: Diagnosis not present

## 2018-09-28 DIAGNOSIS — E876 Hypokalemia: Secondary | ICD-10-CM | POA: Diagnosis not present

## 2018-09-28 DIAGNOSIS — B20 Human immunodeficiency virus [HIV] disease: Secondary | ICD-10-CM | POA: Diagnosis not present

## 2018-09-28 DIAGNOSIS — B180 Chronic viral hepatitis B with delta-agent: Secondary | ICD-10-CM | POA: Diagnosis not present

## 2018-09-28 DIAGNOSIS — Z888 Allergy status to other drugs, medicaments and biological substances status: Secondary | ICD-10-CM | POA: Diagnosis not present

## 2018-09-28 DIAGNOSIS — B182 Chronic viral hepatitis C: Secondary | ICD-10-CM

## 2018-09-28 DIAGNOSIS — K7290 Hepatic failure, unspecified without coma: Secondary | ICD-10-CM | POA: Diagnosis present

## 2018-09-28 DIAGNOSIS — Z6826 Body mass index (BMI) 26.0-26.9, adult: Secondary | ICD-10-CM | POA: Diagnosis not present

## 2018-09-28 DIAGNOSIS — R21 Rash and other nonspecific skin eruption: Secondary | ICD-10-CM | POA: Diagnosis not present

## 2018-09-28 DIAGNOSIS — R51 Headache: Secondary | ICD-10-CM | POA: Diagnosis not present

## 2018-09-28 DIAGNOSIS — B1710 Acute hepatitis C without hepatic coma: Secondary | ICD-10-CM | POA: Diagnosis not present

## 2018-09-28 DIAGNOSIS — Z8619 Personal history of other infectious and parasitic diseases: Secondary | ICD-10-CM

## 2018-09-28 DIAGNOSIS — Z95828 Presence of other vascular implants and grafts: Secondary | ICD-10-CM | POA: Diagnosis not present

## 2018-09-28 LAB — URINALYSIS, ROUTINE W REFLEX MICROSCOPIC
GLUCOSE, UA: NEGATIVE mg/dL
HGB URINE DIPSTICK: NEGATIVE
Ketones, ur: NEGATIVE mg/dL
Leukocytes, UA: NEGATIVE
NITRITE: NEGATIVE
PH: 6 (ref 5.0–8.0)
Protein, ur: 30 mg/dL — AB
SPECIFIC GRAVITY, URINE: 1.016 (ref 1.005–1.030)

## 2018-09-28 LAB — CBC WITH DIFFERENTIAL/PLATELET
Abs Immature Granulocytes: 0.3 10*3/uL — ABNORMAL HIGH (ref 0.0–0.1)
BASOS ABS: 0.1 10*3/uL (ref 0.0–0.1)
Basophils Relative: 1 %
EOS PCT: 1 %
Eosinophils Absolute: 0.1 10*3/uL (ref 0.0–0.7)
HCT: 34.1 % — ABNORMAL LOW (ref 39.0–52.0)
Hemoglobin: 12.2 g/dL — ABNORMAL LOW (ref 13.0–17.0)
Immature Granulocytes: 4 %
Lymphocytes Relative: 22 %
Lymphs Abs: 1.6 10*3/uL (ref 0.7–4.0)
MCH: 32.7 pg (ref 26.0–34.0)
MCHC: 35.8 g/dL (ref 30.0–36.0)
MCV: 91.4 fL (ref 78.0–100.0)
MONO ABS: 0.5 10*3/uL (ref 0.1–1.0)
Monocytes Relative: 7 %
Neutro Abs: 4.7 10*3/uL (ref 1.7–7.7)
Neutrophils Relative %: 65 %
Platelets: 231 10*3/uL (ref 150–400)
RBC: 3.73 MIL/uL — AB (ref 4.22–5.81)
RDW: 17.7 % — AB (ref 11.5–15.5)
WBC: 7.2 10*3/uL (ref 4.0–10.5)

## 2018-09-28 LAB — COMPREHENSIVE METABOLIC PANEL
ALBUMIN: 2 g/dL — AB (ref 3.5–5.0)
ALT: 88 U/L — AB (ref 0–44)
AST: 262 U/L — AB (ref 15–41)
Alkaline Phosphatase: 161 U/L — ABNORMAL HIGH (ref 38–126)
Anion gap: 10 (ref 5–15)
BILIRUBIN TOTAL: 28 mg/dL — AB (ref 0.3–1.2)
BUN: 5 mg/dL — AB (ref 6–20)
CO2: 26 mmol/L (ref 22–32)
CREATININE: 1.32 mg/dL — AB (ref 0.61–1.24)
Calcium: 8.3 mg/dL — ABNORMAL LOW (ref 8.9–10.3)
Chloride: 95 mmol/L — ABNORMAL LOW (ref 98–111)
GFR calc Af Amer: >60 mL/min (ref >60)
GFR calc non Af Amer: >60 mL/min (ref >60)
GLUCOSE: 91 mg/dL (ref 70–99)
Potassium: 2.5 mmol/L — CL (ref 3.5–5.1)
Sodium: 131 mmol/L — ABNORMAL LOW (ref 135–145)
TOTAL PROTEIN: 8.9 g/dL — AB (ref 6.5–8.1)

## 2018-09-28 LAB — AMMONIA: Ammonia: 72 umol/L — ABNORMAL HIGH (ref 9–35)

## 2018-09-28 LAB — BILIRUBIN, DIRECT: BILIRUBIN DIRECT: 16 mg/dL — AB (ref 0.0–0.2)

## 2018-09-28 LAB — I-STAT CG4 LACTIC ACID, ED
Lactic Acid, Venous: 1.93 mmol/L — ABNORMAL HIGH (ref 0.5–1.9)
Lactic Acid, Venous: 1.42 mmol/L (ref 0.5–1.9)

## 2018-09-28 LAB — PROTIME-INR
INR: 1.34
PROTHROMBIN TIME: 16.4 s — AB (ref 11.4–15.2)

## 2018-09-28 LAB — MAGNESIUM: MAGNESIUM: 1.7 mg/dL (ref 1.7–2.4)

## 2018-09-28 MED ORDER — LEVETIRACETAM 500 MG PO TABS
1000.0000 mg | ORAL_TABLET | Freq: Two times a day (BID) | ORAL | Status: DC
  Administered 2018-09-29 – 2018-10-08 (×20): 1000 mg via ORAL
  Filled 2018-09-28 (×20): qty 2

## 2018-09-28 MED ORDER — IOHEXOL 300 MG/ML  SOLN
100.0000 mL | Freq: Once | INTRAMUSCULAR | Status: AC | PRN
  Administered 2018-09-28: 100 mL via INTRAVENOUS

## 2018-09-28 MED ORDER — POTASSIUM CHLORIDE 10 MEQ/100ML IV SOLN
10.0000 meq | Freq: Once | INTRAVENOUS | Status: AC
  Administered 2018-09-28: 10 meq via INTRAVENOUS
  Filled 2018-09-28: qty 100

## 2018-09-28 MED ORDER — SODIUM CHLORIDE 0.9 % IV BOLUS
500.0000 mL | Freq: Once | INTRAVENOUS | Status: AC
  Administered 2018-09-28: 500 mL via INTRAVENOUS

## 2018-09-28 MED ORDER — HEPARIN SODIUM (PORCINE) 5000 UNIT/ML IJ SOLN
5000.0000 [IU] | Freq: Three times a day (TID) | INTRAMUSCULAR | Status: DC
  Filled 2018-09-28 (×3): qty 1

## 2018-09-28 MED ORDER — POTASSIUM CHLORIDE CRYS ER 20 MEQ PO TBCR
40.0000 meq | EXTENDED_RELEASE_TABLET | Freq: Two times a day (BID) | ORAL | Status: AC
  Administered 2018-09-29 (×2): 40 meq via ORAL
  Filled 2018-09-28 (×2): qty 2

## 2018-09-28 NOTE — ED Notes (Signed)
Urine specimen requested and patient provided with urinal.

## 2018-09-28 NOTE — ED Notes (Signed)
Attempted to call report; nurse unable to take and will call me back.

## 2018-09-28 NOTE — H&P (Signed)
Family Medicine Teaching [facility] Admission History and Physical Service Pager: [phone]  Patient name: Justin Ball Medical record number: [id number] Date of birth: May 24, 1991 Age: 27 y.o. Gender: male  Primary Care Provider: Patient, No Pcp Per Consultants: GI, ID Code Status: Full  Chief Complaint: feeling sick, yellow eyes  Assessment and Plan: Justin Ball is a 27 y.o. male presenting with 2 week h/o malaise and jaundice. PMH is significant for chronic hepatitis B, HIV on ART, h/o cryptococcal meningoencephalitis, bipolar d/o.  Acute on chronic Hep B Transaminitis (AST 262, ALT 88), hyperbilirubinemia (total bili 28, direct 16), hyperammonemia (72), low albumin (2.0), hematuria with physical findings of scleral icterus and jaundice suggestive of acute on chronic hepatitis. Follows with [facility] with last appointment 04/18/2018. 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 No evidence of ascites or hepatomegaly although exquisitely tender to palpation in RUQ. CT Abd/Pelvis without focal hepatic abnormality. Patient with h/o medication noncompliance, suspect noncompliance during time away from home with reinitiation of ART likely exacerbating chronic hepatitis.  - admit to telemetry, attending Dr. Deirdre Priest - GI consulted in ED, appreciate recs - ID consulted in ED, appreciate recs - vitals per floor routine - am CBC w/ diff - am CMP  Colitis Mild bowel wall thickening or R colon visualized on CT Abd/Pelvis with history of lack of bowel movement in the last 4 days. Not likely infectious as no leukocytosis, fever. Has h/o abdominal abscess with VP shunt, s/p removal 12/2017. Could be due to constipation and dehydration given lactic acidosis on admission and h/o vomiting,  however most likely inflammatory response from reinitiating ART if noncompliant with medication while away. - s/p NS bolus in ED - bowel rest, NPO - NS mIVF   HIV Follows with [facility], last appointment 04/18/2018. Laurey Arrow, [place] daily. States he doesn't take fluconazole anymore. Last viral load 35, last CD4 count 13.  - CD4 count pending - HIV viral load - holding ART meds for now given prolonged QTc, appreciate ID recs - ID consulted in ED, appreciate recs  Hypokalemia K 2.5. S/p 2 runs IV K in ED.  - Kdur BID x2 doses - recheck am CMP  Prolonged QTc QTc 604 on ED EKG without arrhythmia. Previous EKG with QTc 399. - cardiac monitoring - am EKG - hold QT prolonging medications   AKI Cr 1.32, baseline around 0.9. Likely in the setting of dehydration given vomiting and increased urination. Could also have inflammatory component given reactivation of hepatitis. - s/p NS bolus in ED - NS mIVF   Normocytic Anemia Hb 12.2, previously 14 in 03/2018. Likely related to acute hepatitis, RDW elevated. No signs of acute bleed. 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 Started on Keppra and chronic fluconazole which patient states he doesn't take anymore. Per chart review, last ID note 03/2018, he should continue on this indefinitely. 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 - hold fluconazole given prolonged QTc, appreciate ID recs  Bipolar d/o Patient states he no longer has bipolar d/o. States he previously saw a doctor for this but doesn't know the name. States  he last took seroquel 4 months ago but isn't sure. Noted to be PRN for agitation.  - hold seroquel for now, can add if becomes agitated.  FEN/GI: NPO, NS mIVF Prophylaxis:  heparin  Disposition: admit to telemetry, attending Dr. Deirdre Priest  History of Present Illness:  Justin Ball is a 27 y.o. male presenting with 2 week history of feeling sick. Patient states over the past 2 weeks, his eyes and mouth have turned yellow and his urine is black. Also states he has had difficulty seeing long distances. He states he has been staying with a friend of his in [place] for the past month but recently returned to stay with his grandmother. He denies acute worsening, states his grandmother prompted him to be seen. Endorses nausea, few episodes of nonbilious vomiting. Denies diarrhea or blood in stool or vomit. He reports he last had a bowel movement 4 days ago, typically goes every day. Reports daily compliance with his HIV medications, denies new medications. He drinks 1 can of beer every 5 days, denies wine or liquor use. Denies illicit drug use. Smokes tobacco. He is sexually active with one male partner. Denies concerns for STIs, no abnormal penile discharge or dysuria. Does not use protection.  Review Of Systems: Per HPI with the following additions:   Review of Systems  Constitutional: Positive for malaise/fatigue. Negative for fever.  HENT: Negative for ear pain and sore throat.   Eyes: Positive for blurred vision. Negative for photophobia, pain and discharge.  Respiratory: Positive for cough. Negative for hemoptysis, sputum production and shortness of breath.   Gastrointestinal: Positive for nausea and vomiting. Negative for blood in stool, constipation and diarrhea.  Genitourinary: Positive for frequency. Negative for dysuria, flank pain and urgency.  Skin: Negative for rash.  Neurological: Positive for headaches. Negative for dizziness, tingling, tremors, seizures, loss of consciousness and weakness.  Psychiatric/Behavioral: Negative for hallucinations and suicidal ideas.   Patient Active Problem List   Diagnosis Date Noted  . Suprapubic catheter (HCC)  01/30/2018  . Loose stools 01/30/2018  . Intraabdominal fluid collection   . Infection of ventricular shunt (HCC) 12/31/2017  . Fecal incontinence 11/19/2017  . Seizure disorder (HCC) 09/12/2017  . 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 History of syphilis   . Tobacco abuse   . Cryptococcal meningoencephalitis (HCC)   . History of cryptococcal meningitis   . 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 Anemia 09/12/2016  . 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 Bipolar disorder (HCC) 04/08/2013   Past Medical History: Past Medical History:  Diagnosis Date  . 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 Anxiety   . 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 Bipolar 1 disorder (HCC)   . 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 Cognitive communication deficit    working w/ therapy  . 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 Depression   . 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 Fecal incontinence 11/19/2017  . Foley catheter in place   . Frontal lobe and executive function deficit following  cerebral infarction 05/20/2017   cerebral infection  . 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 History of scabies 2013  . History of syphilis   . 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 Neurogenic bowel    wears depends  . Neuromuscular disorder (HCC)   . 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 Seizure disorder [facility])    neurologist-  dr  Marjory Lies ([facility])--caused by cryptococcal meningitis/ multiple infarctions--  no seizure since hospitalization May 2018  . Status epilepticus (HCC)   . Visuospatial deficit    Past Surgical History: Past Surgical History:  Procedure Laterality Date  . CYSTOSCOPY N/A 10/10/2017   Procedure: CYSTOSCOPY;  Surgeon: Sebastian Ache, MD;  [facility];  Service: Urology;  Laterality: N/A;  . EXAMINATION UNDER ANESTHESIA N/A 12/28/2014   Procedure: EXAM UNDER ANESTHESIA;  Surgeon: Karie Soda, MD;  Location: [facility];  Service: General;  Laterality: N/A;  . FLOOR OF MOUTH BIOPSY N/A 09/16/2016   Procedure: BIOPSY OF ORAL ABCESS;  Surgeon: Newman Pies, MD;  Location: [facility];  Service: ENT;  Laterality: N/A;  . INCISION AND DRAINAGE ABSCESS N/A 09/16/2016   Procedure: INCISION AND DRAINAGE ABSCESS;  Surgeon: Newman Pies, MD;  Location: [facility];  Service: ENT;  Laterality: N/A;  . INCISION AND DRAINAGE PERIRECTAL ABSCESS  09/ 03/ 2011   dr toth  . INCISION AND DRAINAGE PERIRECTAL ABSCESS N/A 12/28/2014   Procedure: IRRIGATION AND DEBRIDEMENT PERIRECTAL ABSCESS;  Surgeon: Karie Soda, MD;  Location: [facility];  Service: General;  Laterality: N/A;  Fistula repair and ablation  . INSERTION OF SUPRAPUBIC CATHETER N/A 10/10/2017   Procedure: INSERTION OF SUPRAPUBIC CATHETER;  Surgeon: Sebastian Ache, MD;  Location: [facility];  Service: Urology;  Laterality: N/A;  . LAPAROSCOPIC REVISION VENTRICULAR-PERITONEAL (V-P) SHUNT N/A 06/12/2017   Procedure: LAPAROSCOPIC REVISION VENTRICULAR-PERITONEAL (V-P) SHUNT;  Surgeon: Kinsinger, De Blanch, MD;  Location: [facility];  Service: General;  Laterality: N/A;  . SHUNT REMOVAL N/A 01/01/2018   Procedure: REMOVAL OF V-P SHUNT, PLACEMENT OF EVD;  Surgeon: [name], MD;  Location: [facility];  Service: Neurosurgery;  Laterality: N/A;  . TRANSTHORACIC ECHOCARDIOGRAM  06/02/2017   EF 65-70%/  trivial PR  . VENTRICULOPERITONEAL SHUNT N/A  06/12/2017   Procedure: SHUNT INSERTION VENTRICULAR-PERITONEAL;  Surgeon: [name], MD;  Location: [facility];  Service: Neurosurgery;  Laterality: N/A;    Social History: Social History   Tobacco Use  . Smoking status: Current Every Day Smoker    Packs/day: 0.50    Years: 5.00    Pack years: 2.50    Types: Cigarettes  . Smokeless tobacco: Never Used  . Tobacco comment: cutting back  Substance Use Topics  . Alcohol use: No  . Drug use: No   Additional social history: Lives at home with grandmother, no pets. Does not work.  Please also refer to relevant sections of EMR.  Family History: Family History  Problem Relation Age of Onset  . Hypertension Other    Allergies and Medications: Allergies  Allergen Reactions  . Acyclovir And Related Itching   No current facility-administered medications on file prior to encounter.    Current Outpatient Medications on File Prior to Encounter  Medication Sig Dispense Refill  . acetaminophen (TYLENOL) 325 MG tablet Take 2 tablets (650 mg total) by mouth every 4 (four)  hours as needed for mild pain (temp > 101.5).    Marland Kitchen dolutegravir (TIVICAY) 50 MG tablet Take 1 tablet (50 mg total) by mouth daily. 30 tablet 6  . emtricitabine-rilpivir-tenofovir AF (ODEFSEY) 200-25-25 MG TABS tablet Take 1 tablet by mouth daily with breakfast. (Patient taking differently: Take 1 tablet by mouth daily with breakfast. ) 30 tablet 6  . fluconazole (DIFLUCAN) 200 MG tablet Take 1 tablet (200 mg total) by mouth daily. 30 tablet 8  . levETIRAcetam (KEPPRA) 1000 MG tablet Take 1 tablet (1,000 mg total) by mouth 2 (two) times daily. (Patient not taking: Reported on 09/28/2018) 60 tablet 5  . QUEtiapine (SEROQUEL) 25 MG tablet Take 1 tablet (25 mg total) by mouth 2 (two) times daily as needed (agitation). (Patient not taking: Reported on 09/28/2018) 60 tablet 4    Objective: BP 114/68   Pulse 95   Temp 99.1 F (37.3 C) (Oral)   Resp (!) 22   Ht 6\' 1"  (1.854  m)   Wt 77.1 kg   SpO2 97%   BMI 22.43 kg/m  Exam: General: pleasant male in NAD, lying in bed Eyes: scleral icterus, PERRL, EOMI ENTM: MMM, jaundice noted to MM Neck: ROM intact, no cervical lymphadenopathy Cardiovascular: RRR, no murmurs/rubs/gallops Respiratory: CTAB, no wheezes/rales/rhonchi. Appropriately saturated with normal WOB on RA. Gastrointestinal: soft, TTP diffusely with exquisite tenderness to RUQ, hypoactive BS. No hepatosplenomegaly appreciated. +Murphy's sign. MSK: ROM grossly intact. 5/5 strength to U/L extremities b/l Derm: diffuse excoriations noted to bilateral U/L extremities and upper back. No open wounds or rashes noted. Neuro: Alert and oriented, speech slowed (apparently baseline). Romberg negative. Optic field normal. PERRL, Extraocular movements intact. Intact symmetric sensation to light touch of face and extremities bilaterally. Hearing grossly intact bilaterally. Tongue protrudes normally with no deviation. Shoulder shrug, smile symmetric. Finger to nose normal. Psych: mood and affect appropriate. Limited insight.  Labs and Imaging: CBC BMET  Recent Labs  Lab 09/28/18 1850  WBC 7.2  HGB 12.2*  HCT 34.1*  PLT 231   Recent Labs  Lab 09/28/18 1850  NA 131*  K 2.5*  CL 95*  CO2 26  BUN 5*  CREATININE 1.32*  GLUCOSE 91  CALCIUM 8.3*     Ct Abdomen Pelvis W Contrast  Result Date: 09/28/2018 CLINICAL DATA:  Acute abdominal pain. EXAM: CT ABDOMEN AND PELVIS WITH CONTRAST TECHNIQUE: Multidetector CT imaging of the abdomen and pelvis was performed using the standard protocol following bolus administration of intravenous contrast. CONTRAST:  OMNIPAQUE IOHEXOL 300 MG/ML  SOLN COMPARISON:  04/20/2018 FINDINGS: Lower chest: Unremarkable. Hepatobiliary: No focal abnormality within the liver parenchyma. Probable tiny layering gallstone. No intrahepatic or extrahepatic biliary dilation. Pancreas: No focal mass lesion. No dilatation of the main duct. No  intraparenchymal cyst. No peripancreatic edema. Spleen: No splenomegaly. No focal mass lesion. Adrenals/Urinary Tract: No adrenal nodule or mass. Kidneys unremarkable. No evidence for hydroureter. The urinary bladder appears normal for the degree of distention. Suprapubic tube noted on prior study has been removed in the interval. Stomach/Bowel: Stomach is nondistended. No gastric wall thickening. No evidence of outlet obstruction. Duodenum is normally positioned as is the ligament of Treitz. No small bowel wall thickening. No small bowel dilatation. The terminal ileum is normal. The appendix is normal. Mild circumferential wall thickening is noted in the right colon (image 58/series 3). Transverse and left colon are unremarkable. Vascular/Lymphatic: No abdominal aortic aneurysm. Small to upper normal lymph nodes are seen in the hepato duodenal ligament and  retroperitoneal space of the abdomen. No pelvic sidewall lymphadenopathy. Reproductive: The prostate gland and seminal vesicles have normal imaging features. Other: No intraperitoneal free fluid. Musculoskeletal: No worrisome lytic or sclerotic osseous abnormality. IMPRESSION: 1. Mild circumferential wall thickening noted in the right colon. Imaging features suspicious for infectious/inflammatory colitis. 2. Otherwise unremarkable study. Electronically Signed   By: Kennith Center M.D.   On: 09/28/2018 21:22   Ellwood Dense, DO 09/28/2018, 9:35 PM PGY-2, [facility] FPTS Intern pager: [phone], text pages welcome

## 2018-09-28 NOTE — ED Provider Notes (Addendum)
[facility] EMERGENCY DEPARTMENT Provider Note   CSN: [id number] Arrival date & time: 09/28/18  1835     History   Chief Complaint Chief Complaint  Patient presents with  . Jaundice    HPI Justin Ball is a 27 y.o. male.  Patient with history of HIV, noncompliance with medications, bipolar, meningeal encephalitis, chronic viral hepatitis B presents with fatigue and jaundice.  Patient left the area and was living in another city for over 2 weeks and was not taking his medication for at least 3 weeks.  Patient last few days has been living back with his mother has had worsening jaundice and fatigue.  Patient denies fevers or chills.  Mild upper abdominal discomfort.  No confusion per mother.  Symptoms gradually worsening.  Patient denies alcohol or illegal drug use.  Patient denies hepatitis history.     Past Medical History:  Diagnosis Date  . 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 Anxiety   . 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 Bipolar 1 disorder (HCC)   . 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 Cognitive communication deficit    working w/ therapy  . 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 Depression   . 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 Fecal incontinence 11/19/2017  . Foley catheter in place   . 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 History of scabies 2013  . History of syphilis   . Memory deficit after cerebral infarction    due to infection    . 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 Neurogenic bowel    wears depends  . Neuromuscular disorder (HCC)   . 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 Seizure disorder [facility])    neurologist-  dr Marjory Lies ([facility])--caused by cryptococcal meningitis/ multiple infarctions--  no seizure since hospitalization May 2018  . Status epilepticus (HCC)   . Visuospatial deficit     Patient Active Problem List   Diagnosis Date Noted  . Suprapubic catheter (HCC) 01/30/2018  . Loose stools 01/30/2018  . Intraabdominal fluid collection   . Infection of ventricular shunt (HCC) 12/31/2017  . Fecal incontinence 11/19/2017  . Seizure disorder (HCC) 09/12/2017  . 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 History of syphilis   . Tobacco abuse   . Cryptococcal meningoencephalitis (HCC)   . History of cryptococcal meningitis   . 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 Anemia 09/12/2016  . 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 Bipolar disorder (HCC) 04/08/2013    Past Surgical History:  Procedure Laterality Date  . CYSTOSCOPY N/A 10/10/2017   Procedure: CYSTOSCOPY;  Surgeon: Sebastian Ache, MD;  Location: [facility];  Service: Urology;  Laterality: N/A;  . EXAMINATION UNDER ANESTHESIA N/A 12/28/2014   Procedure: EXAM UNDER ANESTHESIA;  Surgeon: Karie Soda, MD;  Location: [facility];  Service: General;  Laterality:  N/A;  . FLOOR OF MOUTH BIOPSY N/A 09/16/2016   Procedure: BIOPSY OF ORAL ABCESS;  Surgeon: Newman Pies, MD;  Location: [facility];  Service: ENT;  Laterality: N/A;  . INCISION AND DRAINAGE ABSCESS N/A 09/16/2016   Procedure: INCISION AND DRAINAGE ABSCESS;  Surgeon: Newman Pies, MD;  Location: [facility];  Service: ENT;  Laterality: N/A;  . INCISION AND DRAINAGE PERIRECTAL ABSCESS  09/ 03/ 2011   dr toth  . INCISION AND DRAINAGE PERIRECTAL ABSCESS N/A 12/28/2014    Procedure: IRRIGATION AND DEBRIDEMENT PERIRECTAL ABSCESS;  Surgeon: Karie Soda, MD;  Location: [facility];  Service: General;  Laterality: N/A;  Fistula repair and ablation  . INSERTION OF SUPRAPUBIC CATHETER N/A 10/10/2017   Procedure: INSERTION OF SUPRAPUBIC CATHETER;  Surgeon: Sebastian Ache, MD;  Location: [facility];  Service: Urology;  Laterality: N/A;  . LAPAROSCOPIC REVISION VENTRICULAR-PERITONEAL (V-P) SHUNT N/A 06/12/2017   Procedure: LAPAROSCOPIC REVISION VENTRICULAR-PERITONEAL (V-P) SHUNT;  Surgeon: Kinsinger, De Blanch, MD;  Location: [facility];  Service: General;  Laterality: N/A;  . SHUNT REMOVAL N/A 01/01/2018   Procedure: REMOVAL OF V-P SHUNT, PLACEMENT OF EVD;  Surgeon: [name], MD;  Location: [facility];  Service: Neurosurgery;  Laterality: N/A;  . TRANSTHORACIC ECHOCARDIOGRAM  06/02/2017   EF 65-70%/  trivial PR  . VENTRICULOPERITONEAL SHUNT N/A 06/12/2017   Procedure: SHUNT INSERTION VENTRICULAR-PERITONEAL;  Surgeon: [name], MD;  Location: [facility];  Service: Neurosurgery;  Laterality: N/A;        Home Medications    Prior to Admission medications   Medication Sig Start Date End Date Taking? Authorizing Provider  acetaminophen (TYLENOL) 325 MG tablet Take 2 tablets (650 mg total) by mouth every 4 (four) hours as needed for mild pain (temp > 101.5). 06/28/17  Yes Cliffton Asters, MD  dolutegravir (TIVICAY) 50 MG tablet Take 1 tablet (50 mg total) by mouth daily. 09/23/18  Yes [name], Lisette Grinder, MD  emtricitabine-rilpivir-tenofovir AF (ODEFSEY) 200-25-25 MG TABS tablet Take 1 tablet by mouth daily with breakfast. Patient taking differently: Take 1 tablet by mouth daily with breakfast.  07/20/17  Yes [name], Lisette Grinder, MD  fluconazole (DIFLUCAN) 200 MG tablet Take 1 tablet (200 mg total) by mouth daily. 03/20/18   Randall Hiss, MD  levETIRAcetam (KEPPRA) 1000 MG tablet Take 1 tablet (1,000 mg total) by mouth 2 (two) times daily. Patient not  taking: Reported on 09/28/2018 09/23/18   [name], Lisette Grinder, MD  QUEtiapine (SEROQUEL) 25 MG tablet Take 1 tablet (25 mg total) by mouth 2 (two) times daily as needed (agitation). Patient not taking: Reported on 09/28/2018 09/23/18   [name], Lisette Grinder, MD    Family History Family History  Problem Relation Age of Onset  . Hypertension Other     Social History Social History   Tobacco Use  . Smoking status: Current Every Day Smoker    Packs/day: 0.50    Years: 5.00    Pack years: 2.50    Types: Cigarettes  . Smokeless tobacco: Never Used  . Tobacco comment: cutting back  Substance Use Topics  . Alcohol use: No  . Drug use: No     Allergies   Acyclovir and related   Review of Systems Review of Systems  Constitutional: Positive for chills and fatigue. Negative for fever.  HENT: Negative for congestion.   Eyes: Negative for visual disturbance.  Respiratory: Negative for shortness of breath.   Cardiovascular: Negative for chest pain.  Gastrointestinal: Positive  for abdominal distention and vomiting. Negative for abdominal pain.  Genitourinary: Negative for dysuria and flank pain.  Musculoskeletal: Negative for back pain, neck pain and neck stiffness.  Skin: Negative for rash.  Neurological: Positive for light-headedness. Negative for headaches.     Physical Exam Updated Vital Signs BP 114/68   Pulse 95   Temp 99.1 F (37.3 C) (Oral)   Resp (!) 22   Ht 6\' 1"  (1.854 m)   Wt 77.1 kg   SpO2 97%   BMI 22.43 kg/m   Physical Exam  Constitutional: He is oriented to person, place, and time. He appears well-developed and well-nourished.  HENT:  Head: Normocephalic and atraumatic.  Dry mucous membranes  Eyes: Right eye exhibits no discharge. Left eye exhibits no discharge. Scleral icterus is present.  Neck: Normal range of motion. Neck supple. No tracheal deviation present.  Cardiovascular: Regular rhythm.  Pulmonary/Chest: Effort normal and breath sounds normal.    Abdominal: Soft. He exhibits no distension. There is tenderness (RUQ). There is no guarding.  Musculoskeletal: He exhibits no edema.  Neurological: He is alert and oriented to person, place, and time.  Skin: Skin is warm. No rash noted.  jaundice  Psychiatric:  fatigue  Nursing note and vitals reviewed.    ED Treatments / Results  Labs (all labs ordered are listed, but only abnormal results are displayed) Labs Reviewed  COMPREHENSIVE METABOLIC PANEL - Abnormal; Notable for the following components:      Result Value   Sodium 131 (*)    Potassium 2.5 (*)    Chloride 95 (*)    BUN 5 (*)    Creatinine, Ser 1.32 (*)    Calcium 8.3 (*)    Total Protein 8.9 (*)    Albumin 2.0 (*)    AST 262 (*)    ALT 88 (*)    Alkaline Phosphatase 161 (*)    Total Bilirubin 28.0 (*)    All other components within normal limits  CBC WITH DIFFERENTIAL/PLATELET - Abnormal; Notable for the following components:   RBC 3.73 (*)    Hemoglobin 12.2 (*)    HCT 34.1 (*)    RDW 17.7 (*)    Abs Immature Granulocytes 0.3 (*)    All other components within normal limits  BILIRUBIN, DIRECT - Abnormal; Notable for the following components:   Bilirubin, Direct 16.0 (*)    All other components within normal limits  AMMONIA - Abnormal; Notable for the following components:   Ammonia 72 (*)    All other components within normal limits  PROTIME-INR - Abnormal; Notable for the following components:   Prothrombin Time 16.4 (*)    All other components within normal limits  I-STAT CG4 LACTIC ACID, ED - Abnormal; Notable for the following components:   Lactic Acid, Venous 1.93 (*)    All other components within normal limits  MAGNESIUM  T-HELPER CELLS (CD4) COUNT (NOT AT [facility])  URINALYSIS, ROUTINE W REFLEX MICROSCOPIC  HEPATITIS PANEL, ACUTE  I-STAT CG4 LACTIC ACID, ED    EKG EKG Interpretation  Date/Time:  Saturday September 28 2018 21:06:41 EDT Ventricular Rate:  97 PR Interval:    QRS  Duration: 77 QT Interval:  475 QTC Calculation: 604 R Axis:   64 Text Interpretation:  Sinus rhythm Borderline repolarization abnormality Prolonged QT interval Confirmed by Blane Ohara [phone]) on 09/28/2018 9:35:02 PM   Radiology Ct Abdomen Pelvis W Contrast  Result Date: 09/28/2018 CLINICAL DATA:  Acute abdominal pain. EXAM: CT ABDOMEN AND PELVIS  WITH CONTRAST TECHNIQUE: Multidetector CT imaging of the abdomen and pelvis was performed using the standard protocol following bolus administration of intravenous contrast. CONTRAST:  OMNIPAQUE IOHEXOL 300 MG/ML  SOLN COMPARISON:  04/20/2018 FINDINGS: Lower chest: Unremarkable. Hepatobiliary: No focal abnormality within the liver parenchyma. Probable tiny layering gallstone. No intrahepatic or extrahepatic biliary dilation. Pancreas: No focal mass lesion. No dilatation of the main duct. No intraparenchymal cyst. No peripancreatic edema. Spleen: No splenomegaly. No focal mass lesion. Adrenals/Urinary Tract: No adrenal nodule or mass. Kidneys unremarkable. No evidence for hydroureter. The urinary bladder appears normal for the degree of distention. Suprapubic tube noted on prior study has been removed in the interval. Stomach/Bowel: Stomach is nondistended. No gastric wall thickening. No evidence of outlet obstruction. Duodenum is normally positioned as is the ligament of Treitz. No small bowel wall thickening. No small bowel dilatation. The terminal ileum is normal. The appendix is normal. Mild circumferential wall thickening is noted in the right colon (image 58/series 3). Transverse and left colon are unremarkable. Vascular/Lymphatic: No abdominal aortic aneurysm. Small to upper normal lymph nodes are seen in the hepato duodenal ligament and retroperitoneal space of the abdomen. No pelvic sidewall lymphadenopathy. Reproductive: The prostate gland and seminal vesicles have normal imaging features. Other: No intraperitoneal free fluid. Musculoskeletal: No  worrisome lytic or sclerotic osseous abnormality. IMPRESSION: 1. Mild circumferential wall thickening noted in the right colon. Imaging features suspicious for infectious/inflammatory colitis. 2. Otherwise unremarkable study. Electronically Signed   By: Kennith Center M.D.   On: 09/28/2018 21:22    Procedures .Critical Care Performed by: Blane Ohara, MD Authorized by: Blane Ohara, MD   Critical care provider statement:    Critical care time (minutes):  35   Critical care start time:  09/28/2018 8:25 PM   Critical care end time:  09/28/2018 9:00 PM   Critical care time was exclusive of:  Separately billable procedures and treating other patients and teaching time   Critical care was necessary to treat or prevent imminent or life-threatening deterioration of the following conditions:  Hepatic failure   Critical care was time spent personally by me on the following activities:  Discussions with consultants, evaluation of patient's response to treatment, examination of patient, ordering and performing treatments and interventions, ordering and review of laboratory studies, ordering and review of radiographic studies, pulse oximetry, re-evaluation of patient's condition, obtaining history from patient or surrogate and review of old charts   I assumed direction of critical care for this patient from another provider in my specialty: no     (including critical care time)  Medications Ordered in ED Medications  potassium chloride 10 mEq in 100 mL IVPB (10 mEq Intravenous New Bag/Given 09/28/18 2117)  potassium chloride 10 mEq in 100 mL IVPB (has no administration in time range)  sodium chloride 0.9 % bolus 500 mL (0 mLs Intravenous Stopped 09/28/18 2119)  iohexol (OMNIPAQUE) 300 MG/ML solution 100 mL (100 mLs Intravenous Contrast Given 09/28/18 2046)     Initial Impression / Assessment and Plan / ED Course  I have reviewed the triage vital signs and the nursing notes.  Pertinent labs & imaging  results that were available during my care of the patient were reviewed by me and considered in my medical decision making (see chart for details).    Patient presents with clinically acute hepatitis concern for hepatitis B with history of HIV and noncompliance with medication.  Patient's liver function elevated and bilirubin significantly elevated at 28, potassium 2.5 with  prolonged QT on EKG that was reviewed.  Reviewed CT scan nonspecific inflammation.  Patient's QT is prolonged IV potassium ordered. Discussed the case with gastroenterology, infectious disease Dr. Orvan Falconer and family medicine while agree with admission and to follow along with patient care.  Updated the patient.  The patients results and plan were reviewed and discussed.   Any x-rays performed were independently reviewed by myself.   Differential diagnosis were considered with the presenting HPI.  Medications  potassium chloride 10 mEq in 100 mL IVPB (10 mEq Intravenous New Bag/Given 09/28/18 2117)  potassium chloride 10 mEq in 100 mL IVPB (has no administration in time range)  sodium chloride 0.9 % bolus 500 mL (0 mLs Intravenous Stopped 09/28/18 2119)  iohexol (OMNIPAQUE) 300 MG/ML solution 100 mL (100 mLs Intravenous Contrast Given 09/28/18 2046)    Vitals:   09/28/18 1840 09/28/18 1841 09/28/18 1915 09/28/18 2110  BP: 111/71  96/62 114/68  Pulse: (!) 106  91 95  Resp: 16  (!) 24 (!) 22  Temp: 99.1 F (37.3 C)     TempSrc: Oral     SpO2: 99%  100% 97%  Weight:  77.1 kg    Height:  6\' 1"  (1.854 m)      Final diagnoses:  Acute renal failure, unspecified acute renal failure type (HCC)  Acute hepatitis  Jaundice  Serum total bilirubin elevated  History of HIV infection (HCC)  Hypokalemia  Non compliance w medication regimen  Prolonged Q-T interval on ECG    Admission/ observation were discussed with the admitting physician, patient and/or family and they are comfortable with the plan.    Final Clinical  Impressions(s) / ED Diagnoses   Final diagnoses:  Acute renal failure, unspecified acute renal failure type (HCC)  Acute hepatitis  Jaundice  Serum total bilirubin elevated  History of HIV infection (HCC)  Hypokalemia  Non compliance w medication regimen  Prolonged Q-T interval on ECG    ED Discharge Orders    None       Blane Ohara, MD 09/28/18 2135    Blane Ohara, MD 09/28/18 2136

## 2018-09-28 NOTE — ED Triage Notes (Addendum)
Pt endorses that visual problems and "my eyes have been yellow" x 2 weeks. Denies alcohol use or liver problems. Has hx of HIV and is taking all meds. Tachy.

## 2018-09-29 DIAGNOSIS — B1910 Unspecified viral hepatitis B without hepatic coma: Secondary | ICD-10-CM

## 2018-09-29 LAB — CBC WITH DIFFERENTIAL/PLATELET
ABS IMMATURE GRANULOCYTES: 0.1 10*3/uL (ref 0.0–0.1)
Basophils Absolute: 0 10*3/uL (ref 0.0–0.1)
Basophils Relative: 1 %
EOS ABS: 0 10*3/uL (ref 0.0–0.7)
Eosinophils Relative: 1 %
HEMATOCRIT: 31.4 % — AB (ref 39.0–52.0)
HEMOGLOBIN: 11 g/dL — AB (ref 13.0–17.0)
Immature Granulocytes: 3 %
LYMPHS ABS: 1.2 10*3/uL (ref 0.7–4.0)
LYMPHS PCT: 22 %
MCH: 33.2 pg (ref 26.0–34.0)
MCHC: 35 g/dL (ref 30.0–36.0)
MCV: 94.9 fL (ref 78.0–100.0)
MONOS PCT: 6 %
Monocytes Absolute: 0.3 10*3/uL (ref 0.1–1.0)
Neutro Abs: 3.5 10*3/uL (ref 1.7–7.7)
Neutrophils Relative %: 67 %
Platelets: 192 10*3/uL (ref 150–400)
RBC: 3.31 MIL/uL — ABNORMAL LOW (ref 4.22–5.81)
RDW: 18.7 % — AB (ref 11.5–15.5)
WBC: 5.2 10*3/uL (ref 4.0–10.5)

## 2018-09-29 LAB — COMPREHENSIVE METABOLIC PANEL
ALT: 68 U/L — AB (ref 0–44)
AST: 227 U/L — AB (ref 15–41)
Albumin: 1.6 g/dL — ABNORMAL LOW (ref 3.5–5.0)
Alkaline Phosphatase: 136 U/L — ABNORMAL HIGH (ref 38–126)
Anion gap: 10 (ref 5–15)
BUN: 6 mg/dL (ref 6–20)
CHLORIDE: 103 mmol/L (ref 98–111)
CO2: 21 mmol/L — AB (ref 22–32)
CREATININE: 1.19 mg/dL (ref 0.61–1.24)
Calcium: 8 mg/dL — ABNORMAL LOW (ref 8.9–10.3)
GFR calc non Af Amer: >60 mL/min (ref >60)
Glucose, Bld: 83 mg/dL (ref 70–99)
POTASSIUM: 3 mmol/L — AB (ref 3.5–5.1)
SODIUM: 134 mmol/L — AB (ref 135–145)
Total Bilirubin: 23 mg/dL (ref 0.3–1.2)
Total Protein: 7.3 g/dL (ref 6.5–8.1)

## 2018-09-29 LAB — CRYPTOCOCCAL ANTIGEN
Crypto Ag: POSITIVE — AB
Cryptococcal Ag Titer: 640 — AB

## 2018-09-29 MED ORDER — OXYCODONE HCL 5 MG PO TABS
5.0000 mg | ORAL_TABLET | Freq: Once | ORAL | Status: AC
  Administered 2018-09-29: 5 mg via ORAL
  Filled 2018-09-29: qty 1

## 2018-09-29 MED ORDER — SODIUM CHLORIDE 0.9 % IV SOLN
INTRAVENOUS | Status: AC
  Administered 2018-09-29: 100 mL/h via INTRAVENOUS
  Administered 2018-09-29: 01:00:00 via INTRAVENOUS

## 2018-09-29 MED ORDER — IBUPROFEN 200 MG PO TABS
400.0000 mg | ORAL_TABLET | Freq: Once | ORAL | Status: AC
  Administered 2018-09-29: 400 mg via ORAL
  Filled 2018-09-29: qty 2

## 2018-09-29 MED ORDER — IBUPROFEN 200 MG PO TABS
200.0000 mg | ORAL_TABLET | Freq: Once | ORAL | Status: AC
  Administered 2018-09-29: 200 mg via ORAL
  Filled 2018-09-29: qty 1

## 2018-09-29 NOTE — Progress Notes (Signed)
Patient arrived to 3E17 from [facility]. A&Ox4. SR on telemetry.VSS. No skin breakdown. Pt oriented to room and call system.

## 2018-09-29 NOTE — Progress Notes (Signed)
Family Medicine Teaching Service Daily Progress Note Intern Pager: [phone]  Patient name: Justin Ball Medical record number: [id number] Date of birth: 01/04/1991 Age: 27 y.o. Gender: male  Primary Care Provider: Patient, No Pcp Per Consultants: GI, ID Code Status: Full  Pt Overview and Major Events to Date:  10/5 - admitted for acute on chronic hepatitis B  Assessment and Plan: Justin Ball is a 27 y.o. male presenting with 2 week h/o malaise and jaundice. PMH is significant for chronic hepatitis B, HIV on ART, h/o cryptococcal meningoencephalitis, bipolar d/o.  Acute on chronic Hep B Transaminitis slightly improved with IVF overnight AST 262>227, ALT 88>68, total bili 28>23. Appears to be at mental baseline this am. Abdominal tenderness improved on exam, now s/p BM however still slightly TTP in RUQ, no hepatomegaly appreciated on exam. - GI consulted in ED, appreciate recs - ID consulted in ED, appreciate recs - vitals per floor routine - daily CBC  - daily CMP  Colitis Remains afebrile. Reports BM overnight with improved abdominal distention and pain. Will add diet this am. - s/p NS bolus in ED - regular diet - NS mIVF   HIV Follows with [facility], last appointment 04/18/2018. Laurey Arrow, [place] daily. States he doesn't take fluconazole anymore. Last viral load 35, last CD4 count 13.  - CD4 count pending - HIV viral load pending - holding ART meds for now given prolonged QTc, appreciate ID recs - ID consulted in ED, appreciate recs  Hypokalemia K 2.5>3.0. Will receive 2nd dose of Kdur this morning. - recheck am CMP  Prolonged QTc QTc 604 on ED EKG without arrhythmia. Repeat EKG this am QTc 498, sinus rhythm. - cardiac monitoring - hold QT prolonging medications   AKI Cr 1.32, baseline around 0.9. Likely in the setting of dehydration given vomiting and increased urination. Could also have inflammatory component given reactivation of hepatitis. - s/p NS  bolus in ED - NS mIVF   Normocytic Anemia Hb 12.2>11 this am, drop likely dilutional given fluids. Previously 14 in 03/2018. Likely related to acute hepatitis, RDW elevated. No signs of acute bleed. 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 Started on Keppra and chronic fluconazole which patient states he doesn't take anymore. Per chart review, last ID note 03/2018, he should continue on this indefinitely. 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 - hold fluconazole given prolonged QTc, appreciate ID recs  Bipolar d/o Patient states he no longer has bipolar d/o. States he previously saw a doctor for this but doesn't know the name. States he last took seroquel 4 months ago but isn't sure. Noted to be PRN for agitation.  - hold seroquel for now, can add if becomes agitated.  FEN/GI: regular diet Prophylaxis: heparin  Disposition: continue inpatient management of acute hepatitis  Subjective:  Patient sleeping on exam, easily awakens. Feeling better this am after BM. No blood in stool.  Objective: Temp:  [97.9 F (36.6 C)-99.1 F (37.3 C)] 97.9 F (36.6 C) (10/06 0744) Pulse Rate:  [81-106] 88 (10/06 0744) Resp:  [16-24] 18 (10/06 0744) BP: (96-114)/(62-76) 104/68 (10/06 0744) SpO2:  [97 %-100 %] 98 % (10/06 0744) Weight:  [77.1 kg-81.7 kg] 81.7 kg (10/06 0500) Physical Exam: General: pleasant male in NAD, sleeping on exam, awakens easily  Cardiovascular: RRR, no murmur Respiratory: CTAB, normal WOB on RA, saturating appropriately Abdomen: soft, TTP in RUQ, improved abdominal distension. +BS. No hepatomegaly Extremities: warm and well perfused, no LE edema  Laboratory: Recent Labs  Lab 09/28/18 1850 09/29/18 0650  WBC 7.2 5.2  HGB  12.2* 11.0*  HCT 34.1* 31.4*  PLT 231 192   Recent Labs  Lab 09/28/18 1850 09/29/18 0650  NA 131* 134*  K 2.5* 3.0*  CL 95* 103  CO2 26 21*  BUN 5* 6  CREATININE 1.32* 1.19  CALCIUM 8.3* 8.0*  PROT 8.9* 7.3  BILITOT 28.0* 23.0*  ALKPHOS 161* 136*  ALT 88* 68*  AST 262* 227*  GLUCOSE 91 83   Imaging/Diagnostic Tests: Ct Abdomen Pelvis W Contrast  Result Date: 09/28/2018 CLINICAL DATA:  Acute abdominal pain. EXAM: CT ABDOMEN AND PELVIS WITH CONTRAST TECHNIQUE: Multidetector CT imaging of the abdomen and pelvis was performed using the standard protocol following bolus administration of intravenous contrast. CONTRAST:  OMNIPAQUE IOHEXOL 300 MG/ML  SOLN COMPARISON:  04/20/2018 FINDINGS: Lower chest: Unremarkable. Hepatobiliary: No focal abnormality within the liver parenchyma. Probable tiny layering gallstone. No intrahepatic or extrahepatic biliary dilation. Pancreas: No focal mass lesion. No dilatation of the main duct. No intraparenchymal cyst. No peripancreatic edema. Spleen: No splenomegaly. No focal mass lesion. Adrenals/Urinary Tract: No adrenal nodule or mass. Kidneys unremarkable. No evidence for hydroureter. The urinary bladder appears normal for the degree of distention. Suprapubic tube noted on prior study has been removed in the interval. Stomach/Bowel: Stomach is nondistended. No gastric wall thickening. No evidence of outlet obstruction. Duodenum is normally positioned as is the ligament of Treitz. No small bowel wall thickening. No small bowel dilatation. The terminal ileum is normal. The appendix is normal. Mild circumferential wall thickening is noted in the right colon (image 58/series 3). Transverse and left colon are unremarkable. Vascular/Lymphatic: No abdominal aortic aneurysm. Small to upper normal lymph nodes are seen in the hepato duodenal ligament and retroperitoneal space of the abdomen. No pelvic sidewall lymphadenopathy. Reproductive: The prostate gland and  seminal vesicles have normal imaging features. Other: No intraperitoneal free fluid. Musculoskeletal: No worrisome lytic or sclerotic osseous abnormality. IMPRESSION: 1. Mild circumferential wall thickening noted in the right colon. Imaging features suspicious for infectious/inflammatory colitis. 2. Otherwise unremarkable study. Electronically Signed   By: Kennith Center M.D.   On: 09/28/2018 21:22   Ellwood Dense, DO 09/29/2018, 8:53 AM PGY-2, [facility] FPTS Intern pager: [phone], text pages welcome

## 2018-09-29 NOTE — Consult Note (Signed)
Consultation  Referring Provider:     ER Primary Care Physician:  Patient, No Pcp Per Primary Gastroenterologist:        Unassigned Reason for Consultation:     Hyperbilirubinemia            HPI:   Justin Ball is a 27 y.o. 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 Patient was somewhat confused, kept complaining of headache and blurred vision. Unable to provide much history, but was responding appropriately to questions . Denies any nausea, vomiting, dysphagia or abdominal pain. He is having diarrhea, unable to quantify the frequency . No blood in stool.   He had stopped taking HAART and recently started taking medications 2-3 days ago.   Past Medical History:  Diagnosis Date  . 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 Anxiety   . 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 Bipolar 1 disorder (HCC)   . 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 Cognitive communication deficit    working w/ therapy  . 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 Depression   . 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 Fecal incontinence 11/19/2017  . Foley catheter in place   . 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 History of scabies 2013  . History of syphilis   . 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 Neurogenic bowel    wears depends  .  Neuromuscular disorder (HCC)   . 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 Seizure disorder [facility])    neurologist-  dr Marjory Lies ([facility])--caused by cryptococcal meningitis/ multiple infarctions--  no seizure since hospitalization May 2018  . Status epilepticus (HCC)   . Visuospatial deficit     Past Surgical History:  Procedure Laterality Date  . CYSTOSCOPY N/A 10/10/2017   Procedure: CYSTOSCOPY;  Surgeon: Sebastian Ache, MD;  Location: [facility];  Service: Urology;  Laterality: N/A;  . EXAMINATION UNDER ANESTHESIA N/A 12/28/2014   Procedure: EXAM UNDER ANESTHESIA;  Surgeon: Karie Soda, MD;  Location: [facility];  Service: General;  Laterality: N/A;  . FLOOR OF MOUTH BIOPSY N/A 09/16/2016   Procedure: BIOPSY OF ORAL ABCESS;  Surgeon: Newman Pies, MD;  Location: [facility];  Service: ENT;  Laterality: N/A;  . INCISION AND DRAINAGE ABSCESS N/A 09/16/2016   Procedure: INCISION AND DRAINAGE ABSCESS;  Surgeon: Newman Pies, MD;  Location: [facility];  Service: ENT;  Laterality: N/A;  . INCISION AND DRAINAGE PERIRECTAL ABSCESS  09/ 03/ 2011   dr toth  . INCISION AND DRAINAGE PERIRECTAL ABSCESS N/A 12/28/2014   Procedure: IRRIGATION AND DEBRIDEMENT PERIRECTAL ABSCESS;  Surgeon: Karie Soda, MD;  Location: [facility];  Service: General;  Laterality: N/A;  Fistula repair and ablation  . INSERTION OF SUPRAPUBIC CATHETER N/A 10/10/2017   Procedure: INSERTION OF SUPRAPUBIC CATHETER;  Surgeon: Sebastian Ache, MD;  Location: [facility];  Service: Urology;  Laterality: N/A;  . LAPAROSCOPIC REVISION VENTRICULAR-PERITONEAL (V-P) SHUNT N/A 06/12/2017   Procedure: LAPAROSCOPIC REVISION VENTRICULAR-PERITONEAL (V-P) SHUNT;  Surgeon: Kinsinger, De Blanch, MD;  Location: [facility];  Service: General;  Laterality: N/A;  . SHUNT REMOVAL N/A 01/01/2018   Procedure: REMOVAL OF V-P SHUNT, PLACEMENT OF EVD;  Surgeon: [name], MD;  Location: [facility];  Service: Neurosurgery;  Laterality: N/A;  . TRANSTHORACIC  ECHOCARDIOGRAM  06/02/2017   EF 65-70%/  trivial PR  . VENTRICULOPERITONEAL SHUNT N/A 06/12/2017   Procedure: SHUNT INSERTION VENTRICULAR-PERITONEAL;  Surgeon: [name], MD;  Location[facility];  Service: Neurosurgery;  Laterality: N/A;    Family History  Problem Relation Age of Onset  . Hypertension Other      Social History   Tobacco Use  . Smoking status: Current Every Day Smoker    Packs/day: 0.50    Years: 5.00    Pack years: 2.50    Types: Cigarettes  . Smokeless tobacco: Never Used  . Tobacco comment: cutting back  Substance Use Topics  . Alcohol use: No  . Drug use: No    Prior to Admission medications   Medication Sig Start Date End Date Taking? Authorizing Provider  acetaminophen (TYLENOL) 325 MG tablet Take 2 tablets (650 mg total) by mouth every 4 (four) hours as needed for mild pain (temp > 101.5). 06/28/17  Yes Cliffton Asters, MD  dolutegravir (TIVICAY) 50 MG tablet Take 1 tablet (50 mg total) by mouth daily. 09/23/18  Yes [name], Lisette Grinder, MD  emtricitabine-rilpivir-tenofovir AF (ODEFSEY) 200-25-25 MG TABS tablet Take 1 tablet by mouth daily with breakfast. Patient taking differently: Take 1 tablet by mouth daily with breakfast.  07/20/17  Yes [name], Lisette Grinder, MD  fluconazole (DIFLUCAN) 200 MG tablet Take 1 tablet (200 mg total) by mouth daily. 03/20/18   Randall Hiss, MD  levETIRAcetam (KEPPRA) 1000 MG tablet Take 1 tablet (1,000 mg total) by mouth 2 (two) times daily. Patient not taking: Reported on 09/28/2018 09/23/18   [name], Lisette Grinder, MD  QUEtiapine (SEROQUEL) 25 MG tablet Take 1 tablet (25 mg total) by mouth 2 (two) times daily as needed (agitation). Patient not taking: Reported on 09/28/2018 09/23/18   [name], Lisette Grinder, MD    Current Facility-Administered Medications  Medication Dose Route Frequency Provider Last Rate Last Dose  . 0.9 %  sodium chloride infusion   Intravenous Continuous Ellwood Dense, DO 100 mL/hr at 09/29/18  0715 100 mL/hr at 09/29/18 0715  . heparin injection 5,000 Units  5,000 Units Subcutaneous Q8H Rumball, Alison, DO      . levETIRAcetam (KEPPRA) tablet 1,000 mg  1,000 mg Oral BID Ellwood Dense, DO   1,000 mg at 09/29/18 0951    Allergies as of 09/28/2018 - Review Complete 09/28/2018  Allergen Reaction Noted  . Acyclovir and related Itching 12/31/2017     Review of Systems:    This is positive for those things mentioned in the HPI, also positive for headache, weakness, fatigue, and blurred vision All other review of systems are negative.       Physical Exam:  Vital signs in last 24 hours: Temp:  [97.9 F (36.6 C)-99.1 F (37.3 C)] 98.1 F (36.7 C) (10/06 1204) Pulse Rate:  [72-106] 72 (10/06 1204) Resp:  [16-24] 20 (10/06 1204) BP: (96-114)/(62-76) 97/63 (10/06 1204) SpO2:  [97 %-100 %] 100 % (10/06 1204) Weight:  [77.1 kg-81.7 kg] 81.7 kg (10/06 0500) Last BM Date: 09/27/18  General:  Appears cachetic, no acute distress Eyes:  icteric. Lungs: Clear to auscultation bilaterally. Heart:  S1S2, no rubs, murmurs, gallops. Abdomen:  soft, diffuse mild-tenderness, no distension, BS Extremities:   no edema Neuro:  A&O x 2. Neck stiffness    Data Reviewed:   LAB RESULTS: Recent Labs    09/28/18 1850 09/29/18 0650  WBC 7.2 5.2  HGB 12.2* 11.0*  HCT 34.1* 31.4*  PLT 231 192   BMET Recent Labs    09/28/18 1850 09/29/18 0650  NA 131* 134*  K 2.5* 3.0*  CL 95* 103  CO2 26 21*  GLUCOSE 91 83  BUN 5* 6  CREATININE 1.32* 1.19  CALCIUM 8.3* 8.0*   LFT Recent Labs    09/28/18 1926 09/29/18 0650  PROT  --  7.3  ALBUMIN  --  1.6*  AST  --  227*  ALT  --  68*  ALKPHOS  --  136*  BILITOT  --  23.0*  BILIDIR 16.0*  --    PT/INR Recent Labs    09/28/18 1934  LABPROT 16.4*  INR 1.34    STUDIES: Ct Abdomen Pelvis W Contrast  Result Date: 09/28/2018 CLINICAL DATA:  Acute abdominal pain. EXAM: CT ABDOMEN AND PELVIS WITH CONTRAST TECHNIQUE:  Multidetector CT imaging of the abdomen and pelvis was performed using the standard protocol following bolus administration of intravenous contrast. CONTRAST:  OMNIPAQUE IOHEXOL 300 MG/ML  SOLN COMPARISON:  04/20/2018 FINDINGS: Lower chest: Unremarkable. Hepatobiliary: No focal abnormality within the liver parenchyma. Probable tiny layering gallstone. No intrahepatic or extrahepatic biliary dilation. Pancreas: No focal mass lesion. No dilatation of the main duct. No intraparenchymal cyst. No peripancreatic edema. Spleen: No splenomegaly. No focal mass lesion. Adrenals/Urinary Tract: No adrenal nodule or mass. Kidneys unremarkable. No evidence for hydroureter. The urinary bladder appears normal for the degree of distention. Suprapubic tube noted on prior study has been removed in the interval. Stomach/Bowel: Stomach is nondistended. No gastric wall thickening. No evidence of outlet obstruction. Duodenum is normally positioned as is the ligament of Treitz. No small bowel wall thickening. No small bowel dilatation. The terminal ileum is normal. The appendix is normal. Mild circumferential wall thickening is noted in the right colon (image 58/series 3). Transverse and left colon are unremarkable. Vascular/Lymphatic: No abdominal aortic aneurysm. Small to upper normal lymph nodes are seen in the hepato duodenal ligament and retroperitoneal space of the abdomen. No pelvic sidewall lymphadenopathy. Reproductive: The prostate gland and seminal vesicles have normal imaging features. Other: No intraperitoneal free fluid. Musculoskeletal: No worrisome lytic or sclerotic osseous abnormality. IMPRESSION: 1. Mild circumferential wall thickening noted in the right colon. Imaging features suspicious for infectious/inflammatory colitis. 2. Otherwise unremarkable study. Electronically Signed   By: Kennith Center M.D.   On: 09/28/2018 21:22     PREVIOUS ENDOSCOPIES:            None    Impression / Plan:   5 yr M with  h/o HIV, cryptococcal meningitis, AIDS, and Hep B  HIV/AIDS:  Consult ID inpatient  Jaundice likely secondary to reactivation of Hep B due to med non compliance Check Hep B PCR viral load. Treatment per ID for HIV/Hep B coinfection  Diarrhea: check GI pathogen panel  CT with findings of R side colon thickening likely secondary to infectious colitis Check TB quantiferon Will hold off colonoscopy or endoscopic evaluation unless infectious work up has been negative  Headache and blurred vision: Consider head CT and lumbar puncture.  Iona Beard , MD [phone]

## 2018-09-30 ENCOUNTER — Inpatient Hospital Stay (HOSPITAL_COMMUNITY): Payer: Medicaid Other

## 2018-09-30 ENCOUNTER — Other Ambulatory Visit: Payer: Medicaid Other | Admitting: *Deleted

## 2018-09-30 DIAGNOSIS — F17210 Nicotine dependence, cigarettes, uncomplicated: Secondary | ICD-10-CM

## 2018-09-30 DIAGNOSIS — R17 Unspecified jaundice: Secondary | ICD-10-CM

## 2018-09-30 DIAGNOSIS — Z9114 Patient's other noncompliance with medication regimen: Secondary | ICD-10-CM

## 2018-09-30 DIAGNOSIS — L299 Pruritus, unspecified: Secondary | ICD-10-CM

## 2018-09-30 DIAGNOSIS — B451 Cerebral cryptococcosis: Secondary | ICD-10-CM

## 2018-09-30 DIAGNOSIS — Z8661 Personal history of infections of the central nervous system: Secondary | ICD-10-CM

## 2018-09-30 DIAGNOSIS — R51 Headache: Secondary | ICD-10-CM

## 2018-09-30 DIAGNOSIS — F419 Anxiety disorder, unspecified: Secondary | ICD-10-CM

## 2018-09-30 DIAGNOSIS — Z888 Allergy status to other drugs, medicaments and biological substances status: Secondary | ICD-10-CM

## 2018-09-30 DIAGNOSIS — B20 Human immunodeficiency virus [HIV] disease: Principal | ICD-10-CM

## 2018-09-30 DIAGNOSIS — B179 Acute viral hepatitis, unspecified: Secondary | ICD-10-CM

## 2018-09-30 DIAGNOSIS — B180 Chronic viral hepatitis B with delta-agent: Secondary | ICD-10-CM

## 2018-09-30 DIAGNOSIS — B181 Chronic viral hepatitis B without delta-agent: Secondary | ICD-10-CM

## 2018-09-30 DIAGNOSIS — R21 Rash and other nonspecific skin eruption: Secondary | ICD-10-CM

## 2018-09-30 LAB — MAGNESIUM: MAGNESIUM: 1.7 mg/dL (ref 1.7–2.4)

## 2018-09-30 LAB — COMPREHENSIVE METABOLIC PANEL
ALBUMIN: 1.6 g/dL — AB (ref 3.5–5.0)
ALT: 75 U/L — AB (ref 0–44)
AST: 247 U/L — AB (ref 15–41)
Alkaline Phosphatase: 143 U/L — ABNORMAL HIGH (ref 38–126)
Anion gap: 8 (ref 5–15)
BILIRUBIN TOTAL: 22 mg/dL — AB (ref 0.3–1.2)
BUN: 5 mg/dL — AB (ref 6–20)
CO2: 24 mmol/L (ref 22–32)
CREATININE: 1.16 mg/dL (ref 0.61–1.24)
Calcium: 8.1 mg/dL — ABNORMAL LOW (ref 8.9–10.3)
Chloride: 102 mmol/L (ref 98–111)
GFR calc Af Amer: >60 mL/min (ref >60)
GLUCOSE: 61 mg/dL — AB (ref 70–99)
POTASSIUM: 2.8 mmol/L — AB (ref 3.5–5.1)
Sodium: 134 mmol/L — ABNORMAL LOW (ref 135–145)
TOTAL PROTEIN: 7.4 g/dL (ref 6.5–8.1)

## 2018-09-30 LAB — CBC
HEMATOCRIT: 33.2 % — AB (ref 39.0–52.0)
HEMOGLOBIN: 11.3 g/dL — AB (ref 13.0–17.0)
MCH: 32.5 pg (ref 26.0–34.0)
MCHC: 34 g/dL (ref 30.0–36.0)
MCV: 95.4 fL (ref 78.0–100.0)
Platelets: 194 10*3/uL (ref 150–400)
RBC: 3.48 MIL/uL — AB (ref 4.22–5.81)
RDW: 18.7 % — ABNORMAL HIGH (ref 11.5–15.5)
WBC: 3.6 10*3/uL — AB (ref 4.0–10.5)

## 2018-09-30 LAB — PROTIME-INR
INR: 1.37
Prothrombin Time: 16.7 seconds — ABNORMAL HIGH (ref 11.4–15.2)

## 2018-09-30 LAB — APTT: aPTT: 50 seconds — ABNORMAL HIGH (ref 24–36)

## 2018-09-30 LAB — T-HELPER CELLS (CD4) COUNT (NOT AT ARMC)
CD4 % Helper T Cell: 8 % — ABNORMAL LOW (ref 33–55)
CD4 T CELL ABS: 90 /uL — AB (ref 400–2700)

## 2018-09-30 LAB — BILIRUBIN, DIRECT: BILIRUBIN DIRECT: 15.8 mg/dL — AB (ref 0.0–0.2)

## 2018-09-30 MED ORDER — IBUPROFEN 400 MG PO TABS
400.0000 mg | ORAL_TABLET | Freq: Four times a day (QID) | ORAL | Status: DC | PRN
  Administered 2018-09-30 – 2018-10-11 (×7): 400 mg via ORAL
  Filled 2018-09-30 (×3): qty 1
  Filled 2018-09-30: qty 2
  Filled 2018-09-30 (×3): qty 1

## 2018-09-30 MED ORDER — POTASSIUM CHLORIDE CRYS ER 20 MEQ PO TBCR
40.0000 meq | EXTENDED_RELEASE_TABLET | ORAL | Status: AC
  Administered 2018-09-30 (×2): 40 meq via ORAL
  Filled 2018-09-30 (×2): qty 2

## 2018-09-30 MED ORDER — DEXTROSE 5% FOR FLUSHING BEFORE AND AFTER AMBISOME
10.0000 mL | INTRAVENOUS | Status: AC
  Administered 2018-09-30 – 2018-10-13 (×13): 10 mL via INTRAVENOUS
  Filled 2018-09-30 (×9): qty 50
  Filled 2018-09-30: qty 10
  Filled 2018-09-30 (×5): qty 50

## 2018-09-30 MED ORDER — DIPHENHYDRAMINE HCL 50 MG/ML IJ SOLN: 25.0000 mg | Freq: Every day | INTRAMUSCULAR | Status: DC | PRN

## 2018-09-30 MED ORDER — FLUCYTOSINE 250 MG PO CAPS
25.0000 mg/kg | ORAL_CAPSULE | Freq: Four times a day (QID) | ORAL | Status: AC
  Administered 2018-09-30 – 2018-10-13 (×52): 2000 mg via ORAL
  Filled 2018-09-30 (×32): qty 8
  Filled 2018-09-30: qty 4
  Filled 2018-09-30 (×24): qty 8

## 2018-09-30 MED ORDER — NICOTINE 7 MG/24HR TD PT24
7.0000 mg | MEDICATED_PATCH | Freq: Every day | TRANSDERMAL | Status: AC
  Administered 2018-09-30 – 2018-10-06 (×7): 7 mg via TRANSDERMAL
  Filled 2018-09-30 (×7): qty 1

## 2018-09-30 MED ORDER — POTASSIUM CHLORIDE CRYS ER 20 MEQ PO TBCR
40.0000 meq | EXTENDED_RELEASE_TABLET | Freq: Two times a day (BID) | ORAL | Status: DC
  Administered 2018-09-30: 40 meq via ORAL
  Filled 2018-09-30: qty 2

## 2018-09-30 MED ORDER — ACETAMINOPHEN 325 MG PO TABS
650.0000 mg | ORAL_TABLET | Freq: Every day | ORAL | Status: DC | PRN
  Administered 2018-09-30 – 2018-10-13 (×13): 650 mg via ORAL
  Filled 2018-09-30 (×17): qty 2

## 2018-09-30 MED ORDER — DEXTROSE 5% FOR FLUSHING BEFORE AND AFTER AMBISOME
10.0000 mL | INTRAVENOUS | Status: DC
  Filled 2018-09-30: qty 50

## 2018-09-30 MED ORDER — SODIUM CHLORIDE 0.9 % IV BOLUS FOR AMBISOME
500.0000 mL | INTRAVENOUS | Status: AC
  Administered 2018-10-01 – 2018-10-13 (×14): 500 mL via INTRAVENOUS
  Filled 2018-09-30 (×14): qty 500

## 2018-09-30 MED ORDER — DIPHENHYDRAMINE HCL 25 MG PO CAPS
25.0000 mg | ORAL_CAPSULE | Freq: Every day | ORAL | Status: DC | PRN
  Administered 2018-09-30 – 2018-10-13 (×14): 25 mg via ORAL
  Filled 2018-09-30 (×15): qty 1

## 2018-09-30 MED ORDER — PROMETHAZINE HCL 25 MG PO TABS
12.5000 mg | ORAL_TABLET | Freq: Once | ORAL | Status: AC
  Administered 2018-09-30: 12.5 mg via ORAL
  Filled 2018-09-30: qty 1

## 2018-09-30 MED ORDER — DEXTROSE 5 % IV SOLN
350.0000 mg | INTRAVENOUS | Status: AC
  Administered 2018-09-30 – 2018-10-13 (×14): 350 mg via INTRAVENOUS
  Filled 2018-09-30 (×17): qty 350

## 2018-09-30 MED ORDER — LORAZEPAM 0.5 MG PO TABS
0.5000 mg | ORAL_TABLET | Freq: Once | ORAL | Status: DC
  Filled 2018-09-30: qty 1

## 2018-09-30 MED ORDER — SODIUM CHLORIDE 0.9 % IV BOLUS FOR AMBISOME
500.0000 mL | INTRAVENOUS | Status: DC
  Administered 2018-09-30: 500 mL via INTRAVENOUS
  Filled 2018-09-30 (×2): qty 500

## 2018-09-30 MED ORDER — MEPERIDINE HCL 25 MG/ML IJ SOLN
25.0000 mg | INTRAMUSCULAR | Status: DC | PRN
  Administered 2018-10-05: 25 mg via INTRAVENOUS
  Filled 2018-09-30 (×2): qty 1

## 2018-09-30 MED ORDER — DEXTROSE 5% FOR FLUSHING BEFORE AND AFTER AMBISOME
10.0000 mL | INTRAVENOUS | Status: AC
  Administered 2018-09-30 – 2018-10-13 (×13): 10 mL via INTRAVENOUS
  Filled 2018-09-30: qty 50
  Filled 2018-09-30: qty 10
  Filled 2018-09-30 (×13): qty 50

## 2018-09-30 NOTE — Progress Notes (Signed)
Family medicine pager text paged with potassium 2.8 this morning.

## 2018-09-30 NOTE — Progress Notes (Addendum)
FPTS Interim Progress Note  S:Paged by nurse as patient had vomited three times and had episodes of diarrhea.  Patient seen and examined at bedside.  Patient notes slight nausea, had not been vomiting prior.  Still notes slight frontal headache that has been stable, denies increase in pain with vomiting.  Notes some slight abdominal pain.  O: BP (!) 94/49   Pulse 81   Temp 98 F (36.7 C)   Resp 16   Ht 6\' 1"  (1.854 m)   Wt 83.1 kg Comment: scale c  SpO2 99%   BMI 24.18 kg/m   Physical Exam: General: 27 y.o. male in NAD, sitting up in bed HEENT: Scleral icertus OU Abdomen: Soft, mildly tender RUQ, positive bowel sounds Neuro: CN II-XII grossly intact, follows commands, 5/5 strength BUE/BLE, sensation intact throughout    A/P: No signs of increased ICP at this time, would not perform LP emergently at this time, as it is planned for tomorrow N/V likely from neurogenic bowel per GI note and prior diarrhea they had noted no further workup needed QTc 435 this AM on EKG Will give one dose phenergan 12.5mg  now  Continue to monitor, follow up GI recs   Justin Ball, Solmon Ice, DO 09/30/2018, 11:05 PM PGY-1, [facility] Family Medicine Service pager [phone]

## 2018-09-30 NOTE — Progress Notes (Signed)
Family Medicine Teaching Service Daily Progress Note Intern Pager: [phone]  Patient name: Justin Ball Medical record number: [id number] Date of birth: 08/03/1991 Age: 27 y.o. Gender: male  Primary Care Provider: Patient, No Pcp Per Consultants: GI, ID Code Status: full  Pt Overview and Major Events to Date:  Admitted: 10/05  Assessment and Plan: Justin R Coleis a 27 y.o.malepresenting with 2 week h/o malaise and jaundice. PMH is significant forchronic hepatitis B, HIV on ART, h/o cryptococcal meningoencephalitis, bipolar d/o.  Acute on chronic Hep B Today, AST increased to 247. ALT increased to 75. Total bili improved to 22. Albumin 1.6. Sclera, gingiva, and palmar surfaces remain jaundiced. Appears to be at mental baseline this am. Denies abdominal tenderness, no hepatomegaly appreciated. - GI recs:  -no need for stool path or c diff studies  - f/u Hep B viral load   - still pending  - will continue to follow - ID recs:  - lumbar puncture  - will see today - vitals per floor routine - daily CBC - daily CMP  Colitis CT showed mild wall thickening in right colon. Patient had diarrhea on admission. Remains afebrile. No BM since 2 nights ago. - no further workup at this time per GI - regular diet - NS mIVF  HIV Follows with [facility], last appointment 04/18/2018.Justin Ball, [place] daily. Medications held in setting of QTc prolongation on admission.States he doesn't take fluconazole anymore. Today, CD4 resulted 90, viral load still pending. Crypto Ag positive 640. - repeat EKG this am shows morderline QTc prolongation still so still holding meds (appreciate ID recs) - ID consulted- stated they will see today and recommended LP - f/u viral load  Hypokalemia K 2.8 today. Replete K+. - PO BID - recheck am CMP  Prolonged QTc QTc 604 on ED EKG without arrhythmia. Repeat EKG this am QTc 435, sinus rhythm. - cardiac monitoring - hold QT prolonging  medications   AKI- improved Cr 1.16 today, baseline around 0.9. - s/p NS bolus in ED - encourage PO fluid intake  NormocyticAnemia Hb 11.3 this am, drop likely dilutional given fluids. No signs of acute bleed. 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 Started on Keppra and chronic fluconazole which patient states he doesn't take anymore. Per chart review, last ID note 03/2018, he should continue on this indefinitely. Patient denies any stiffness or pain today. - hold fluconazole given prolonged QTc - f/u ID recs  - LP  Bipolar d/o Patient states he no longer has bipolar d/o. States he previously saw a doctor for this but doesn't know the name. States he last took seroquel 4 months ago but isn't sure. Noted to be PRN for agitation.  - hold seroquel for now, can add if becomes agitated.  FEN/GI: regular diet PPx: SCDs  Disposition: med surg  Subjective:  Overnight: headache- given 400mg  motrin- resolved  Today: no complaints or concerns  Objective: Temp:  [97.7 F (36.5 C)-98.2 F (36.8 C)] 98 F (36.7 C) (10/07 0414) Pulse Rate:  [72-86] 77 (10/07 0414) Resp:  [18-20] 18 (10/07 0414) BP: (95-107)/(59-73) 103/63 (10/07 0414) SpO2:  [99 %-100 %] 99 % (10/07 0414) Weight:  [83.1 kg] 83.1 kg (10/07 0414)   Physical Exam: General: resting comfortably in bed HEENT: eyes- significant scleral icterus, jaundiced gingival tissue and mucous membranes Cardiovascular: RRR, no murmurs appreciated Respiratory: clear to ascultation bilaterally, no increased WOB  Abdomen: soft, non-tender, non-distended Extremities: no swelling, moving all equally and appropriately Psych: alert and oriented, pleasant and jovial   Laboratory: Recent Labs  Lab 09/28/18 1850 09/29/18 0650 09/30/18 0531  WBC 7.2 5.2 3.6*  HGB 12.2* 11.0* 11.3*  HCT 34.1* 31.4*  33.2*  PLT 231 192 194   Recent Labs  Lab 09/28/18 1850 09/29/18 0650 09/30/18 0531  NA 131* 134* 134*  K 2.5* 3.0* 2.8*  CL 95* 103 102  CO2 26 21* 24  BUN 5* 6 5*  CREATININE 1.32* 1.19 1.16  CALCIUM 8.3* 8.0* 8.1*  PROT 8.9* 7.3 7.4  BILITOT 28.0* 23.0* 22.0*  ALKPHOS 161* 136* 143*  ALT 88* 68* 75*  AST 262* 227* 247*  GLUCOSE 91 83 61*    CD4: 90 Viral load pending Hep B panel pending Magnesium 1.7 Cryptococcal Ag positive 640 Total bilirubin 16.0  Imaging/Diagnostic Tests: ID recommended LP  Justin Bock, DO 09/30/2018, 8:08 AM PGY-1, [place] [facility] FPTS Intern pager: [phone], text pages welcome

## 2018-09-30 NOTE — Progress Notes (Signed)
Received report from Leotis Shames, RN. Patient is in bed resting with no pain and no complains of anything.

## 2018-09-30 NOTE — Consult Note (Addendum)
[facility]    Date of Admission:  09/28/2018   Total days of antibiotics 0                Reason for Consult: HIV/AIDS, (+)Cryptococcal serum Ag     Referring Provider: [place]  Primary Care Provider: Patient, No Pcp Per   Assessment: Justin Ball is a 27 y.o. 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 He presented to the hospital after being out of care and presumably off his HIV medications including his fluconazole for an unknown period of time (he estimates about 3 weeks). He should still be on secondary prophylaxis for CM of which he has not been. His serum cryptococcal angiten titer is positive with a higher titer - cannot necessarily correlate to severity of disease but reaffirms need to pursue LP especially in the setting of his history, current headaches/blurry vision pattern and non-compliance with low CD4.  I spoke with FMTS and asked their consideration of something for anxiety to help him get through LP. I have also discussed with neurology considering his history of requiring VP shunt and disease (Dr. Lisabeth Devoid). He needs a stat Head CT and a stat PT/INR (received vitamin K for coagulopathy.)   Please continue to hold HAART until LP is done to avoid acute worsening/IRIS.    Hep B DNA and acute hepatitis panel pending. Known CHB with (+)HBeAg and elevated ALT. Will need to await LP prior to re-introducing Hep B tx in the form of his Odefsey.   Per his request I have called his grandmother, Justin Ball at [phone] and his mother, Justin Ball at [phone] to update them of the plan. There is no HCPA paperwork on file per her discussion.    Plan: 1. STAT Head CT  2. STAT INR  3. LP - opening pressure please in addition to cell count, gram stain/culture, cryptococcal Ag on CSF gluc/protein.  4. Hold HAART until #1  done 5. 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 heparin  5,000 Units Subcutaneous Q8H  . levETIRAcetam  1,000 mg Oral BID  . nicotine  7 mg Transdermal Daily  . potassium chloride  40 mEq Oral BID    HPI: Justin Ball is a 27 y.o. male well known to our service with a history of severe cryptococcal meningitis requiring ventriculostomy and VP shunt summer 2018 (removed 12/2017 d/t infection) and prolonged hospitalization. Up until a few months ago he was living with his grandmother at home until about a month ago when we have been unable to locate him in the clinic - presume he has been off his medications. He presented to the ER with yellowing of the eyes and "feeling poorly." He states he feels better ultimately however still complaining of frontal 10/10 headaches and blurry vision.   He has no abdominal pain, diarrhea or nausea presently. Suprapubic catheter fell out previously and insertion site well healed now. Passing urine without difficulty. Only other complaint is "mosquito bites" on the LE's that are pruritic, welted and bleeding.   Review of Systems  Constitutional: Positive for malaise/fatigue. Negative for chills and fever.  HENT: Negative for tinnitus.   Eyes: Positive for blurred vision. Negative for photophobia and pain.  Respiratory: Negative for cough and sputum production.   Cardiovascular: Negative for chest pain.  Gastrointestinal: Negative for diarrhea, nausea and vomiting.  Genitourinary: Negative for dysuria.  Musculoskeletal: Negative for neck pain.  Skin: Positive for rash (pruritic rash d/t "mosquito bites" b/l LEs).  Neurological: Positive for headaches. Negative for focal weakness and seizures.    Past Medical History:  Diagnosis Date  . 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 Anxiety   . 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 Bipolar 1 disorder (HCC)   . 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 Cognitive communication deficit    working w/ therapy  . 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 Depression   . 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 Fecal incontinence 11/19/2017  . Foley catheter in place   . 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 History of scabies 2013  . History of syphilis   . 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 Neurogenic bowel    wears depends  . Neuromuscular disorder (HCC)   . 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 Seizure disorder [facility])    neurologist-  dr Marjory Lies ([facility])--caused by cryptococcal meningitis/ multiple infarctions--  no seizure since hospitalization May 2018  . Status epilepticus (HCC)   . Visuospatial deficit     Social History   Tobacco Use  . Smoking status: Current Every Day Smoker    Packs/day: 0.50    Years: 5.00    Pack years: 2.50    Types: Cigarettes  . Smokeless tobacco: Never Used  . Tobacco comment: cutting back  Substance Use Topics  . Alcohol use: No  . Drug use: No    Family History  Problem Relation Age of Onset  . Hypertension Other    Allergies  Allergen Reactions  . Acyclovir And Related Itching    OBJECTIVE: Blood pressure (!) 85/51, pulse 71, temperature 98.3 F (36.8 C),  temperature source Oral, resp. rate 18, height 6\' 1"  (1.854 m), weight 83.1 kg, SpO2 100 %.  Physical Exam  Constitutional: He is oriented to person, place, and time. He appears well-developed and well-nourished. No distress.  HENT:  Mouth/Throat: No oral lesions. Normal dentition. No dental caries.  Eyes: No scleral icterus.  Cardiovascular: Normal rate, regular rhythm and normal heart sounds.  Pulmonary/Chest: Effort normal and breath sounds normal.  Abdominal: Soft. Bowel sounds are normal. He exhibits no distension. There is no tenderness.  Well healed SP catheter incision.   Lymphadenopathy:    He has no cervical adenopathy.  Neurological: He is alert and oriented to person, place, and time.  Skin: Skin is warm and dry. Rash (Multiple welted, bleeding lesions on B/L legs ) noted.  Psychiatric:  Anxious in discussion of LP  Nursing note and vitals reviewed.   Lab Results Lab Results  Component  Value Date   WBC 3.6 (L) 09/30/2018   HGB 11.3 (L) 09/30/2018   HCT 33.2 (L) 09/30/2018   MCV 95.4 09/30/2018   PLT 194 09/30/2018    Lab Results  Component Value Date   CREATININE 1.16 09/30/2018   BUN 5 (L) 09/30/2018   NA 134 (L) 09/30/2018   K 2.8 (L) 09/30/2018   CL 102 09/30/2018   CO2 24 09/30/2018    Lab Results  Component Value Date   ALT 75 (H) 09/30/2018   AST 247 (H) 09/30/2018   ALKPHOS 143 (H) 09/30/2018   BILITOT 22.0 (HH) 09/30/2018     Microbiology: No results found for this or any previous visit (from the past 240 hour(s)).  Rexene Alberts, MSN, NP-C [facility] Cell: [phone] Pager: [phone]  09/30/2018 3:52 PM

## 2018-09-30 NOTE — Progress Notes (Addendum)
Daily Rounding Note  09/30/2018, 12:10 PM  LOS: 2 days   SUBJECTIVE:   Chief complaint: jaundice.  Single loose stool yesterday.   No further diarrhea.  No abd pain.  No nausea, good appetite and tolerating solid food.   Still c/o headache and blurred vision.    OBJECTIVE:         Vital signs in last 24 hours:    Temp:  [97.7 F (36.5 C)-98.2 F (36.8 C)] 98 F (36.7 C) (10/07 0414) Pulse Rate:  [77-86] 77 (10/07 0414) Resp:  [18] 18 (10/07 0414) BP: (95-107)/(59-73) 103/63 (10/07 0414) SpO2:  [99 %-100 %] 99 % (10/07 0414) Weight:  [83.1 kg] 83.1 kg (10/07 0414) Last BM Date: 09/29/18 Filed Weights   09/28/18 2332 09/29/18 0500 09/30/18 0414  Weight: 81.7 kg 81.7 kg 83.1 kg   General: pleasant, calm.    Heart: RRR Chest: clear bil Abdomen: soft, NT, NT.  HSM not appreciated  Extremities: no CCE Derm:  Innumerable small lesions on arms an legs, ranging from scars to drie/scabbed over lesions "mosquito bites" Neuro/Psych:  Pleasant, cooperative.  Appears intellectually challenged.  Moves all 4 limbs.    Intake/Output from previous day: 10/06 0701 - 10/07 0700 In: 1998.1 [P.O.:1200; I.V.:798.1] Out: 1004 [Urine:1002; Stool:2]  Intake/Output this shift: Total I/O In: 240 [P.O.:240] Out: 300 [Urine:300]  Lab Results: Recent Labs    09/28/18 1850 09/29/18 0650 09/30/18 0531  WBC 7.2 5.2 3.6*  HGB 12.2* 11.0* 11.3*  HCT 34.1* 31.4* 33.2*  PLT 231 192 194   BMET Recent Labs    09/28/18 1850 09/29/18 0650 09/30/18 0531  NA 131* 134* 134*  K 2.5* 3.0* 2.8*  CL 95* 103 102  CO2 26 21* 24  GLUCOSE 91 83 61*  BUN 5* 6 5*  CREATININE 1.32* 1.19 1.16  CALCIUM 8.3* 8.0* 8.1*   LFT Recent Labs    09/28/18 1850 09/28/18 1926 09/29/18 0650 09/30/18 0531  PROT 8.9*  --  7.3 7.4  ALBUMIN 2.0*  --  1.6* 1.6*  AST 262*  --  227* 247*  ALT 88*  --  68* 75*  ALKPHOS 161*  --  136* 143*  BILITOT  28.0*  --  23.0* 22.0*  BILIDIR  --  16.0*  --   --    PT/INR Recent Labs    09/28/18 1934  LABPROT 16.4*  INR 1.34   Hepatitis Panel No results for input(s): HEPBSAG, HCVAB, HEPAIGM, HEPBIGM in the last 72 hours.  Studies/Results: Ct Abdomen Pelvis W Contrast  Result Date: 09/28/2018 CLINICAL DATA:  Acute abdominal pain. EXAM: CT ABDOMEN AND PELVIS WITH CONTRAST TECHNIQUE: Multidetector CT imaging of the abdomen and pelvis was performed using the standard protocol following bolus administration of intravenous contrast. CONTRAST:  OMNIPAQUE IOHEXOL 300 MG/ML  SOLN COMPARISON:  04/20/2018 FINDINGS: Lower chest: Unremarkable. Hepatobiliary: No focal abnormality within the liver parenchyma. Probable tiny layering gallstone. No intrahepatic or extrahepatic biliary dilation. Pancreas: No focal mass lesion. No dilatation of the main duct. No intraparenchymal cyst. No peripancreatic edema. Spleen: No splenomegaly. No focal mass lesion. Adrenals/Urinary Tract: No adrenal nodule or mass. Kidneys unremarkable. No evidence for hydroureter. The urinary bladder appears normal for the degree of distention. Suprapubic tube noted on prior study has been removed in the interval. Stomach/Bowel: Stomach is nondistended. No gastric wall thickening. No evidence of outlet obstruction. Duodenum is normally positioned as is the ligament of Treitz. No small  bowel wall thickening. No small bowel dilatation. The terminal ileum is normal. The appendix is normal. Mild circumferential wall thickening is noted in the right colon (image 58/series 3). Transverse and left colon are unremarkable. Vascular/Lymphatic: No abdominal aortic aneurysm. Small to upper normal lymph nodes are seen in the hepato duodenal ligament and retroperitoneal space of the abdomen. No pelvic sidewall lymphadenopathy. Reproductive: The prostate gland and seminal vesicles have normal imaging features. Other: No intraperitoneal free fluid.  Musculoskeletal: No worrisome lytic or sclerotic osseous abnormality. IMPRESSION: 1. Mild circumferential wall thickening noted in the right colon. Imaging features suspicious for infectious/inflammatory colitis. 2. Otherwise unremarkable study. Electronically Signed   By: Kennith Center M.D.   On: 09/28/2018 21:22    Scheduled Meds: . heparin  5,000 Units Subcutaneous Q8H  . levETIRAcetam  1,000 mg Oral BID  . potassium chloride  40 mEq Oral BID   Continuous Infusions: PRN Meds:.ibuprofen   ASSESMENT:   *  Jaundice.  Suspect due to reactivated Hep B in setting of medication/ID drug non-compliance.   Hep B viral load: not yet ordered.    *   Neurogenic bowel and bladder.  Loose stool once yesterday, none since.     *   HIV/AIDS.  No meds for 3 weeks PTA, went to [facility] and forgot all his meds.        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        PLAN   *  No need for stool path or c diff studies.    *  Hep B viral load ordered.      Justin Ball  09/30/2018, 12:10 PM Phone [phone]  Attending Attestation:  I have independently reviewed the history, performed a physical examination and agree with above full consultation, assessment and plan as detailed by PA Gribbin. No family was present at the time of my evaluation.  Awaiting testing for reactivation of HBV. Recommend fractionating the bilirubin and evaluating for concurrent acute viral hepatitis. Follow daily LFTs. Recommend vitamin K replacement for mild coagulopathy that is most likely due to malnutrition.   Tressia Danas, MD, MPH  Gastroenterology

## 2018-10-01 ENCOUNTER — Inpatient Hospital Stay: Payer: Self-pay

## 2018-10-01 DIAGNOSIS — Z9114 Patient's other noncompliance with medication regimen: Secondary | ICD-10-CM

## 2018-10-01 DIAGNOSIS — E7220 Disorder of urea cycle metabolism, unspecified: Secondary | ICD-10-CM

## 2018-10-01 DIAGNOSIS — R1110 Vomiting, unspecified: Secondary | ICD-10-CM

## 2018-10-01 DIAGNOSIS — B451 Cerebral cryptococcosis: Secondary | ICD-10-CM

## 2018-10-01 DIAGNOSIS — R17 Unspecified jaundice: Secondary | ICD-10-CM

## 2018-10-01 DIAGNOSIS — R9431 Abnormal electrocardiogram [ECG] [EKG]: Secondary | ICD-10-CM

## 2018-10-01 DIAGNOSIS — N179 Acute kidney failure, unspecified: Secondary | ICD-10-CM

## 2018-10-01 LAB — COMPREHENSIVE METABOLIC PANEL
ALBUMIN: 1.4 g/dL — AB (ref 3.5–5.0)
ALK PHOS: 134 U/L — AB (ref 38–126)
ALT: 59 U/L — ABNORMAL HIGH (ref 0–44)
ANION GAP: 4 — AB (ref 5–15)
AST: 189 U/L — ABNORMAL HIGH (ref 15–41)
BILIRUBIN TOTAL: 17.9 mg/dL — AB (ref 0.3–1.2)
BUN: <5 mg/dL — ABNORMAL LOW (ref 6–20)
CALCIUM: 7.6 mg/dL — AB (ref 8.9–10.3)
CO2: 21 mmol/L — ABNORMAL LOW (ref 22–32)
Chloride: 109 mmol/L (ref 98–111)
Creatinine, Ser: 1.16 mg/dL (ref 0.61–1.24)
GFR calc non Af Amer: >60 mL/min (ref >60)
GLUCOSE: 78 mg/dL (ref 70–99)
POTASSIUM: 3.4 mmol/L — AB (ref 3.5–5.1)
SODIUM: 134 mmol/L — AB (ref 135–145)
TOTAL PROTEIN: 6.5 g/dL (ref 6.5–8.1)

## 2018-10-01 LAB — CSF CELL COUNT WITH DIFFERENTIAL
RBC Count, CSF: 0 /mm3
Tube #: 3
WBC CSF: 1 /mm3 (ref 0–5)

## 2018-10-01 LAB — MAGNESIUM: Magnesium: 1.6 mg/dL — ABNORMAL LOW (ref 1.7–2.4)

## 2018-10-01 LAB — HEPATITIS B DNA, ULTRAQUANTITATIVE, PCR
HBV DNA SERPL PCR-ACNC: 810 IU/mL
HBV DNA SERPL PCR-LOG IU: 2.908 {Log_IU}/mL

## 2018-10-01 LAB — CBC
HEMATOCRIT: 30.8 % — AB (ref 39.0–52.0)
HEMOGLOBIN: 10.7 g/dL — AB (ref 13.0–17.0)
MCH: 32.6 pg (ref 26.0–34.0)
MCHC: 34.7 g/dL (ref 30.0–36.0)
MCV: 93.9 fL (ref 80.0–100.0)
Platelets: 182 10*3/uL (ref 150–400)
RBC: 3.28 MIL/uL — ABNORMAL LOW (ref 4.22–5.81)
RDW: 18.3 % — AB (ref 11.5–15.5)
WBC: 7.4 10*3/uL (ref 4.0–10.5)

## 2018-10-01 LAB — CRYPTOCOCCAL ANTIGEN, CSF
Crypto Ag: POSITIVE — AB
Cryptococcal Ag Titer: 20 — AB

## 2018-10-01 LAB — PROTIME-INR
INR: 1.43
Prothrombin Time: 17.4 seconds — ABNORMAL HIGH (ref 11.4–15.2)

## 2018-10-01 LAB — PROTEIN AND GLUCOSE, CSF
Glucose, CSF: 39 mg/dL — ABNORMAL LOW (ref 40–70)
Total  Protein, CSF: 65 mg/dL — ABNORMAL HIGH (ref 15–45)

## 2018-10-01 LAB — AMMONIA: AMMONIA: 72 umol/L — AB (ref 9–35)

## 2018-10-01 LAB — APTT
APTT: 42 s — AB (ref 24–36)
aPTT: 40 seconds — ABNORMAL HIGH (ref 24–36)

## 2018-10-01 LAB — HIV-1 RNA QUANT-NO REFLEX-BLD
HIV 1 RNA Quant: 3460 copies/mL
LOG10 HIV-1 RNA: 3.539 {Log_copies}/mL

## 2018-10-01 MED ORDER — MAGNESIUM SULFATE IN D5W 1-5 GM/100ML-% IV SOLN
1.0000 g | Freq: Once | INTRAVENOUS | Status: AC
  Administered 2018-10-02: 1 g via INTRAVENOUS
  Filled 2018-10-01: qty 100

## 2018-10-01 MED ORDER — CHLORHEXIDINE GLUCONATE CLOTH 2 % EX PADS
6.0000 | MEDICATED_PAD | Freq: Every day | CUTANEOUS | Status: DC
  Administered 2018-10-02 – 2018-10-13 (×11): 6 via TOPICAL

## 2018-10-01 MED ORDER — LACTULOSE 10 GM/15ML PO SOLN
10.0000 g | Freq: Every day | ORAL | Status: DC
  Administered 2018-10-01 – 2018-10-14 (×14): 10 g via ORAL
  Filled 2018-10-01 (×14): qty 15

## 2018-10-01 MED ORDER — PHYTONADIONE 5 MG PO TABS
10.0000 mg | ORAL_TABLET | Freq: Once | ORAL | Status: AC
  Administered 2018-10-01: 10 mg via ORAL
  Filled 2018-10-01: qty 2

## 2018-10-01 MED ORDER — POTASSIUM CHLORIDE CRYS ER 20 MEQ PO TBCR
40.0000 meq | EXTENDED_RELEASE_TABLET | Freq: Once | ORAL | Status: AC
  Administered 2018-10-01: 40 meq via ORAL
  Filled 2018-10-01: qty 2

## 2018-10-01 MED ORDER — SODIUM CHLORIDE 0.9 % IV SOLN
INTRAVENOUS | Status: DC | PRN
  Administered 2018-10-01 – 2018-10-06 (×2): 1000 mL via INTRAVENOUS
  Administered 2018-10-07: 500 mL via INTRAVENOUS
  Administered 2018-10-10: 1000 mL via INTRAVENOUS

## 2018-10-01 MED ORDER — MAGNESIUM SULFATE 2 GM/50ML IV SOLN
2.0000 g | Freq: Once | INTRAVENOUS | Status: DC
  Filled 2018-10-01 (×3): qty 50

## 2018-10-01 MED ORDER — ACETAMINOPHEN 325 MG PO TABS
650.0000 mg | ORAL_TABLET | Freq: Four times a day (QID) | ORAL | Status: DC | PRN
  Administered 2018-10-01: 650 mg via ORAL

## 2018-10-01 MED ORDER — SODIUM CHLORIDE 0.9% FLUSH
10.0000 mL | INTRAVENOUS | Status: DC | PRN
  Administered 2018-10-02: 20 mL
  Administered 2018-10-04 – 2018-10-09 (×2): 10 mL
  Administered 2018-10-10: 20 mL
  Filled 2018-10-01 (×4): qty 40

## 2018-10-01 MED ORDER — LIDOCAINE HCL (PF) 1 % IJ SOLN: 5.0000 mL | Freq: Once | INTRAMUSCULAR | Status: DC

## 2018-10-01 MED ORDER — LIDOCAINE 1% INJECTION FOR CIRCUMCISION
5.0000 mL | INJECTION | Freq: Once | INTRAVENOUS | Status: DC
  Filled 2018-10-01: qty 5

## 2018-10-01 MED ORDER — POTASSIUM CHLORIDE CRYS ER 20 MEQ PO TBCR
40.0000 meq | EXTENDED_RELEASE_TABLET | ORAL | Status: DC
  Administered 2018-10-01: 40 meq via ORAL
  Filled 2018-10-01: qty 2

## 2018-10-01 MED ORDER — SODIUM CHLORIDE 0.9 % IV BOLUS (SEPSIS)
500.0000 mL | INTRAVENOUS | Status: AC
  Administered 2018-10-01 – 2018-10-13 (×11): 500 mL via INTRAVENOUS
  Filled 2018-10-01 (×2): qty 500

## 2018-10-01 MED ORDER — SODIUM CHLORIDE 0.9% FLUSH
10.0000 mL | Freq: Two times a day (BID) | INTRAVENOUS | Status: DC
  Administered 2018-10-01 – 2018-10-12 (×7): 10 mL

## 2018-10-01 NOTE — Progress Notes (Signed)
Patient had a couple episodes of diarrhea and has vomited once. MD has been paged and will come to see pt at bedside.  Elsie Lincoln, RN

## 2018-10-01 NOTE — Progress Notes (Signed)
Daily Rounding Note  10/01/2018, 11:28 AM  LOS: 3 days   SUBJECTIVE:   Chief complaint: jaundice.      Pt c/o headache and back pain.  Asking for pain meds No abd pain, no nausea.  Appetite good, eats 75 to 100% of meals.    OBJECTIVE:         Vital signs in last 24 hours:    Temp:  [97.8 F (36.6 C)-98.3 F (36.8 C)] 97.8 F (36.6 C) (10/08 0519) Pulse Rate:  [71-92] 90 (10/08 0953) Resp:  [16-18] 18 (10/08 0519) BP: (85-101)/(49-65) 101/65 (10/08 0953) SpO2:  [99 %-100 %] 100 % (10/08 0953) Weight:  [83.7 kg] 83.7 kg (10/08 0519) Last BM Date: 09/30/18 Filed Weights   09/29/18 0500 09/30/18 0414 10/01/18 0519  Weight: 81.7 kg 83.1 kg 83.7 kg   General: slight icterus   Heart: RRR Chest: clear bil.  No cough or SOB Abdomen: soft, NT, ND.  No HSM.  Active BS  Extremities: no CCE Neuro/Psych:  Oriented x 3.  Forgetful as to timing of events, did not recall lumbar puncture this AM.  Follows all commands.    Intake/Output from previous day: 10/07 0701 - 10/08 0700 In: 2345.8 [P.O.:720; IV Piggyback:1625.8] Out: 304 [Urine:302; Stool:2]  Intake/Output this shift: No intake/output data recorded.  Lab Results: Recent Labs    09/29/18 0650 09/30/18 0531 10/01/18 0644  WBC 5.2 3.6* 7.4  HGB 11.0* 11.3* 10.7*  HCT 31.4* 33.2* 30.8*  PLT 192 194 182   BMET Recent Labs    09/29/18 0650 09/30/18 0531 10/01/18 0644  NA 134* 134* 134*  K 3.0* 2.8* 3.4*  CL 103 102 109  CO2 21* 24 21*  GLUCOSE 83 61* 78  BUN 6 5* <5*  CREATININE 1.19 1.16 1.16  CALCIUM 8.0* 8.1* 7.6*   LFT Recent Labs    09/28/18 1926 09/29/18 0650 09/30/18 0531 10/01/18 0644  PROT  --  7.3 7.4 6.5  ALBUMIN  --  1.6* 1.6* 1.4*  AST  --  227* 247* 189*  ALT  --  68* 75* 59*  ALKPHOS  --  136* 143* 134*  BILITOT  --  23.0* 22.0* 17.9*  BILIDIR 16.0*  --  15.8*  --    PT/INR Recent Labs    09/30/18 1903 10/01/18 0728    LABPROT 16.7* 17.4*  INR 1.37 1.43   Hepatitis Panel No results for input(s): HEPBSAG, HCVAB, HEPAIGM, HEPBIGM in the last 72 hours.  Studies/Results: Ct Head Wo Contrast  Result Date: 09/30/2018 CLINICAL DATA:  Headache and blurred vision. History of AIDS and cryptococcal meningitis. EXAM: CT HEAD WITHOUT CONTRAST TECHNIQUE: Contiguous axial images were obtained from the base of the skull through the vertex without intravenous contrast. COMPARISON:  01/08/2018 FINDINGS: Brain: The ventriculostomy catheter on the prior study has been removed in the interim with gliosis noted along the catheter track. Gas within the lateral ventricles has resolved with decreased distention of the frontal horns. Mild diffuse ventriculomegaly has otherwise not significantly changed. Hypoattenuation is present in the right greater than left cerebellar hemispheres, better demonstrated today due to greater beam hardening on the prior CT though corresponding to areas of abnormal diffusion signal and edema on a 05/25/2017 brain MRI. Scattered hypodensities in the supratentorial brain reflective of encephalomalacia also correspond to abnormalities on the 2018 MRI with most notable areas of cortical encephalomalacia being in the left occipital lobe, parasagittal parietal lobes, and anterior  left frontal lobe. 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 No definite acute infarct, intracranial hemorrhage, mass, midline shift, or extra-axial fluid collection is identified. Vascular: No hyperdense vessel. Skull: No fracture or suspicious osseous lesion. Sinuses/Orbits: Right maxillary sinus mucosal thickening. Clear mastoid air cells. Unremarkable orbits. Other: None. IMPRESSION: 1. No evidence of acute intracranial abnormality. 2. Interval removal of ventriculostomy catheter with decreased distention of the frontal horns following resolution of pneumocephalus. Otherwise unchanged mild ventriculomegaly. 3.  Areas of encephalomalacia in both cerebral and cerebellar hemispheres corresponding to insults on a 2018 brain MRI. Electronically Signed   By: Sebastian Ache M.D.   On: 09/30/2018 17:44   Scheduled Meds: . dextrose  10 mL Intravenous Q24H  . dextrose  10 mL Intravenous Q24H  . flucytosine  25 mg/kg Oral Q6H  . lactulose  10 g Oral Daily  . levETIRAcetam  1,000 mg Oral BID  . lidocaine (PF)  5 mL Intradermal Once  . LORazepam  0.5 mg Oral Once  . nicotine  7 mg Transdermal Daily  . potassium chloride  40 mEq Oral Once  . sodium chloride  500 mL Intravenous Q24H  . sodium chloride  500 mL Intravenous Q24H   Continuous Infusions: . amphotericin  B  Liposome (AMBISOME) ADULT IV 350 mg (09/30/18 2311)  . magnesium sulfate 1 - 4 g bolus IVPB     PRN Meds:.acetaminophen, diphenhydrAMINE **OR** diphenhydrAMINE, ibuprofen, meperidine (DEMEROL) injection   ASSESMENT:   *   Jaundice.  Reactivated hep B suspected.  Hep B quant, acute hepatitis panel in process. T bili improved.     *    HIV/AIDS.  Had not taken meds for 3-4 weeks PTA.  ART on hold for now.    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  Non-compliant with oral Diflucan.  S/P lumbar puncture this morning. Normal opening pressure, fluid yellow and cloudy.     5-FC and amphotericin initiated by infectious disease.   PLAN   *    Ordered hepatic function profile for tomorrow morning, this will give [place] a fractionated bilirubin.  Await results of acute hepatitis serologies and the Hep B Wilma Flavin  10/01/2018, 11:28 AM Phone [phone]

## 2018-10-01 NOTE — Progress Notes (Addendum)
[facility]  Date of Admission:  09/28/2018   Total days of antibiotics 2        Day 2 L-ampho         Day 2 Flucytosine           Patient ID: Justin Ball is a 27 y.o. 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 dextrose  10 mL Intravenous Q24H  . dextrose  10 mL Intravenous Q24H  . flucytosine  25 mg/kg Oral Q6H  . lactulose  10 g Oral Daily  . levETIRAcetam  1,000 mg Oral BID  . lidocaine (PF)  5 mL Intradermal Once  . LORazepam  0.5 mg Oral Once  . nicotine  7 mg Transdermal Daily  . sodium chloride  500 mL Intravenous Q24H  . sodium chloride  500 mL Intravenous Q24H    SUBJECTIVE: Just had LP done. Opening pressure normal @ 15 but fluid yellow/cloudy in appearance. Headaches have resolved. Vomiting overnight. Ammonia noted to be elevated. Anxious.   Allergies  Allergen Reactions  . Acyclovir And Related Itching    OBJECTIVE: Vitals:   09/30/18 1953 10/01/18 0519 10/01/18 0951 10/01/18 0953  BP: (!) 94/49 (!) 98/51 96/63 101/65  Pulse: 81 78 92 90  Resp: 16 18    Temp: 98 F (36.7 C) 97.8 F (36.6 C)    TempSrc:  Oral    SpO2: 99% 100% 100% 100%  Weight:  83.7 kg    Height:       Body mass index is 24.34 kg/m.  Physical Exam  Constitutional: He is oriented to person, place, and time. He appears well-developed and well-nourished.  Seated comfortably in chair during visit. Resting in bed.   HENT:  Mouth/Throat: Oropharynx is clear and moist and mucous membranes are normal. Normal dentition. No dental abscesses.  Eyes: Scleral icterus is present.  Cardiovascular: Normal rate, regular rhythm and normal heart sounds.  Pulmonary/Chest: Effort normal and breath sounds normal.  Abdominal: Soft. He exhibits no  distension. There is no tenderness.  Lymphadenopathy:    He has no cervical adenopathy.  Neurological: He is alert and oriented to person, place, and time.  Skin: Skin is warm and dry. No rash noted.  Psychiatric: He has a normal mood and affect. Judgment normal.  Vitals reviewed.   Lab Results Lab Results  Component Value Date   WBC 7.4 10/01/2018   HGB 10.7 (L) 10/01/2018   HCT 30.8 (L) 10/01/2018   MCV 93.9 10/01/2018   PLT 182 10/01/2018    Lab Results  Component Value Date   CREATININE 1.16 10/01/2018   BUN <5 (L) 10/01/2018   NA 134 (L) 10/01/2018   K 3.4 (L) 10/01/2018   CL 109 10/01/2018   CO2 21 (L) 10/01/2018    Lab Results  Component Value Date   ALT 59 (H) 10/01/2018   AST 189 (H) 10/01/2018   ALKPHOS 134 (H) 10/01/2018   BILITOT 17.9 (H) 10/01/2018     Microbiology: No results found for this or any previous visit (from the past 240 hour(s)).   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 CSF opening pressure  normal which is reassuring although fluid is yellow tinged/cloudy in appearance. Yellowing may be d/t his hyperbilirubinemia but will continue induction for presumed relapse of cryptococcal meningitis at this time.   Continue aggressive magnesium and potassium replacement while on L-ampho as this is known for electrolyte disturbances. Daily cbc and cmet/mag recommended until stable. Will give a second 40 mEq x 1 and ask FMTS to continue daily replacement.   PLAN: 1. Continue L-amphotericin and flucytosine.  2. Aggressive potassium and magnesium replacement and monitoring while on #1.  3. 40 mEq for second dose today at 2pm.  4. Please continue 500cc pre- and post- L-ampho infusion for kidney protection. 5. Follow LP studies  6. Hepatitis B labs pending  7. Continue to hold HAART    ADDENDUM:  CSF Cryptococcal Ag (+) with titer 60. Will  place midline with anticipation of 2w of induction therapy with IV.   Rexene Alberts, MSN, NP-C [facility]ious [facility] Cell: [phone] Pager: [phone]  10/01/2018  11:02 AM

## 2018-10-01 NOTE — Progress Notes (Signed)
Peripherally Inserted Central Catheter/Midline Placement  The IV Nurse has discussed with the patient and/or persons authorized to consent for the patient, the purpose of this procedure and the potential benefits and risks involved with this procedure.  The benefits include less needle sticks, lab draws from the catheter, and the patient may be discharged home with the catheter. Risks include, but not limited to, infection, bleeding, blood clot (thrombus formation), and puncture of an artery; nerve damage and irregular heartbeat and possibility to perform a PICC exchange if needed/ordered by physician.  Alternatives to this procedure were also discussed.  Bard Power PICC patient education guide, fact sheet on infection prevention and patient information card has been provided to patient /or left at bedside.    PICC/Midline Placement Documentation  PICC Single Lumen 10/01/18 PICC Right Brachial 45 cm 2 cm (Active)  Indication for Insertion or Continuance of Line Prolonged intravenous therapies 10/01/2018  4:20 PM  Exposed Catheter (cm) 2 cm 10/01/2018  4:20 PM  Site Assessment Clean;Dry;Intact 10/01/2018  4:20 PM  Line Status Flushed;Blood return noted 10/01/2018  4:20 PM  Dressing Type Transparent 10/01/2018  4:20 PM  Dressing Status Clean;Dry;Intact;Antimicrobial disc in place 10/01/2018  4:20 PM  Dressing Intervention New dressing 10/01/2018  4:20 PM  Dressing Change Due 10/08/18 10/01/2018  4:20 PM       Reginia Forts Albarece 10/01/2018, 4:23 PM

## 2018-10-01 NOTE — Progress Notes (Signed)
Pharmacy Antibiotic Note  Justin Ball is a 27 y.o. male admitted on 09/28/2018 with cryptococcal meningitis.  Pharmacy has been consulted for Ambisome and flucytosine dosing and electrolyte monitoring  Plan: 27 year onld HIV positive man to start on ambisome and flucytosine for cryptooccal meningitis.  Asked by ID team to initiate the Ambisome per pharmacy protocol which includes electrolyte monitoring and replacement.   Patient has already been started on ambisome 4mg /kg daily with saline boluses before and after to prevent nephrotoxicity.  He has also been started on flucytosine 2g qid (100mg /kg daily). K and Mg values have been addressed for today. Will check daily BMET and Mg and replace if needed starting tomorrow.  Height: 6\' 1"  (185.4 cm) Weight: 184 lb 8 oz (83.7 kg)(scale c) IBW/kg (Calculated) : 79.9  Temp (24hrs), Avg:97.9 F (36.6 C), Min:97.8 F (36.6 C), Max:98 F (36.7 C)  Recent Labs  Lab 09/28/18 1850 09/28/18 1911 09/28/18 2240 09/29/18 0650 09/30/18 0531 10/01/18 0644  WBC 7.2  --   --  5.2 3.6* 7.4  CREATININE 1.32*  --   --  1.19 1.16 1.16  LATICACIDVEN  --  1.93* 1.42  --   --   --     Estimated Creatinine Clearance: 108.1 mL/min (by C-G formula based on SCr of 1.16 mg/dL).   K = 3.4 (replaced) Mg = 1.6 (replaced) Allergies  Allergen Reactions  . Acyclovir And Related Itching    Antimicrobials this admission: Ambisome 10/7 >>  Flucytosine  10/7 >>   Microbiology results: 10/8 CSF>>  Thank you for allowing pharmacy to be a part of this patient's care.  Mickeal Skinner 10/01/2018 3:25 PM

## 2018-10-01 NOTE — Progress Notes (Signed)
Family Medicine Teaching Service Daily Progress Note Intern Pager: [phone]  Patient name: Justin Ball Medical record number: [id number] Date of birth: 06/03/1991 Age: 27 y.o. Gender: male  Primary Care Provider: Patient, No Pcp Per Consultants: GI, ID, neuro Code Status: full  Pt Overview and Major Events to Date:  Admitted: 10/05  Assessment and Plan: Justin R Coleis a 27 y.o.malepresenting with 2 week h/o malaise and jaundice. PMH is significant forchronic hepatitis B, HIV on ART, h/o cryptococcal meningoencephalitis, bipolar d/o.  Acute on chronic Hep B Today, AST decreased to 189. ALT decreased to 59. Total bili improved to 17.9. Albumin 1.4. Sclera, gingiva, and palmar surfaces remain jaundiced but appear improved. Appears to be at mental baseline this am. Denies abdominal tenderness, no hepatomegaly appreciated. - GI recs:  -no need for stool path or c diff studies  - f/u Hep B viral load   - still pending  - continue to hold ART  - hepatic function profile tom am for fractionated bilirubin  - will continue to follow - ID recs:  - lumbar puncture  - L-amphotericin and flucytosine  - monitor K and Mg while on antifungals  - replace K+  - 500cc pre and post ampho dose infusion for kidney protection  - continue to hold ART  - will continue to follow - neuro recs:  -head CT prior to LP- normal  - d/c heparin 12 hr prior to LP  - LP preformed with normal opening pressure  - monitor for post LP headache - vitals per floor routine - daily CBC - daily CMP  Colitis CT showed mild wall thickening in right colon. Patient had diarrhea on admission. Remains afebrile. multiple episodes of non-bloody diarrhea and vomiting over night. None today.  - no further workup at this time per GI - regular diet - encourage oral fluid intake  HIV Follows with [facility], last appointment 04/18/2018.Takes [place], [place] daily. Medications held in setting of QTc prolongation on  admission. QTc normal today. CD4 resulted 90, viral load still pending. Crypto Ag positive 640. - ID consulted- recommended continue hold ART - f/u viral load  Hypokalemia K 3.4 today. Replete K+ aggressively with amphotericin use per ID - <[email or web address] FooteSamaritan NortElliot G47MaMarylandr23n<MEASUREMEN40.9Sherlean FElliot G52MaMarylandr47n<MEASUREMEN40.9Sherlean FooteAcute Care SpecialtElliot G46MaMarylandr54n<MEASUREMEN40.9Sherlean FooteCancer InsElliot G54MaMarylandr69n<MEASUREMEN40.9Sherlean FooteUniversity Of M D Upper ChesaElliot G2MaMarylandr31n<MEASUREMEN40.9Sherlean FooteAspen HilElliot G50MaMarylandr72n<MEASUREMEN40.9Sherlean FooteMercy RegElliot G68MaMarylandr61n<MEASUREMEN40.9Sherlean FooteSuElliot G45MaMarylandr6n<MEASUREMEN40.9Sherlean FooteFlorence Surgery Elliot G25MaMarylandr75n<MEASUREMEN40.9Sherlean Fo10MaMarylan<MEASUREMEN40.9Sherlean FooteMeaElliot G16MaMarylandr83n<MEASUREMEN40.9Sherlean FooteZeiter EyeElliot G66MaMarylandr61n<MEASUREMEN40.9Sherlean FooteEunice ExElliot G38MaMarylandr51n<MEASUREMEN40.9Sherlean FooteJeElliot G28MaMarylandr42n<MEASUREMEN40.9Sherlean FooteElliot G61MaMarylandr61n<MEASUREMEN40.9Sherlean FoElliot G30MaMarylandr72n<MEASUREMEN40.9Sherlean FooteArizona OphthalmiElliot G77MaMarylandr95n<MEASUREMEN40.9Sherlean FooteRed RocksElliot G63MaMarylandr30n<MEASUREMEN40.9Sherlean FooteStafElliot G65MaMarylandr79n<MEASUREMEN40.9Sherlean FooteBellElliot G77MaMarylandr36n<MEASUREMEN40.9Sherlean FooElliot G36MaMarylandr32n<MEASUREMEN40.9Sherlean FooteContinuecare Hospital At PalElliot G31MaMarylandr35n<MEASUREMEN40.9Sherlean FooteBeverly CElliot G10MaMarylandr71n<MEASUREMEN40.9Sherlean FooteUrbana Gi Elliot G77MaMarylandr50n<MEASUREMEN40.9Sherlean FooteAdventist MediElliot G65MaMarylandr93n<MEASUREMEN40.9Sherlean FooteDigestive DisElliot G47MaMarylandr72n<MEASUREMEN40.9Sherlean FooteKaiser FoundationElliot G47MaMarylandr62n<MEASUREMEN40.9Sherlean FooteEncompass Health Rehab HoElliot G26MaMarylandr33n<MEASUREMEN40.9Sherlean FooteWoodhams Laser And LenElliot G50MaMarylandr71n<MEASUREMEN40.9Sherlean FooteNaab RoaElliot G33MaMarylandr61n<MEASUREMEN40.9Sherlean FootePhElliot G35MaMarylandr25n<MEASUREMEN40.9Sherlean FooteLibeElliot G77MaMarylandr52neFlint Meltereey Center am BMP  Prolonged QTc Repeat EKG this am QTc normal, sinus rhythm. - cardiac monitoring - hold QT prolonging medications   AKI- improved Cr 1.16 today, baseline around 0.9. - s/p NS bolus in ED - encourage PO fluid intake  NormocyticAnemia Hb decreased to 10.7 this am. No signs of acute bleed. 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 Started on Keppra and chronic fluconazole which patient states he doesn't take anymore. Per chart review, last ID note 03/2018, he should continue on this indefinitely. Patient denies any stiffness or pain today. - hold fluconazole given prolonged QTc - f/u ID recs  - LP  - amphotericin daily  - flucytosine   Bipolar d/o Patient states he no longer has bipolar d/o. States he previously saw a doctor for this but doesn't know the name. States he last took seroquel 4 months ago but isn't sure. Noted to be PRN for agitation.  - hold seroquel for now, can add if becomes agitated.  FEN/GI: regular diet PPx: SCDs  Disposition: med surg  Subjective:  Overnight: headache- given 400mg  motrin- resolved  Today: no complaints or concerns  Objective: Temp:  [97.8 F (  36.6 C)-98.3 F (36.8 C)] 97.8 F (36.6 C) (10/08 0519) Pulse Rate:  [71-81] 78 (10/08 0519) Resp:  [16-18] 18 (10/08 0519) BP: (85-98)/(49-51) 98/51 (10/08 0519) SpO2:  [99 %-100 %] 100 % (10/08 0519) Weight:  [83.7 kg] 83.7 kg (10/08 0519)   Physical Exam: General: resting comfortably in bed HEENT: eyes- significant scleral icterus, jaundiced gingival tissue and mucous  membranes Cardiovascular: RRR, no murmurs appreciated Respiratory: clear to ascultation bilaterally, no increased WOB Abdomen: soft, non-tender, non-distended Extremities: no swelling, moving all equally and appropriately Psych: alert and oriented, pleasant and jovial   Laboratory: Recent Labs  Lab 09/29/18 0650 09/30/18 0531 10/01/18 0644  WBC 5.2 3.6* 7.4  HGB 11.0* 11.3* 10.7*  HCT 31.4* 33.2* 30.8*  PLT 192 194 182   Recent Labs  Lab 09/28/18 1850 09/29/18 0650 09/30/18 0531  NA 131* 134* 134*  K 2.5* 3.0* 2.8*  CL 95* 103 102  CO2 26 21* 24  BUN 5* 6 5*  CREATININE 1.32* 1.19 1.16  CALCIUM 8.3* 8.0* 8.1*  PROT 8.9* 7.3 7.4  BILITOT 28.0* 23.0* 22.0*  ALKPHOS 161* 136* 143*  ALT 88* 68* 75*  AST 262* 227* 247*  GLUCOSE 91 83 61*    CD4: 90 Viral load pending Hep B panel pending Magnesium 1.7 Cryptococcal Ag positive 640 Total bilirubin 16.0  Imaging/Diagnostic Tests: ID recommended LP  Leeroy Bock, DO 10/01/2018, 7:36 AM PGY-1, [place] [facility] FPTS Intern pager: [phone], text pages welcome

## 2018-10-01 NOTE — Progress Notes (Signed)
[name] IV was occluded - did not get full dose of Mg.   PICC was place and Mg was discarded.   Paged Pharmacy to let them know so we can get another dose sent up and given

## 2018-10-01 NOTE — Progress Notes (Signed)
Patient refused labs this morning, Lab will try again later.

## 2018-10-01 NOTE — Procedures (Addendum)
Lumbar Puncture Procedure Note Indication:  Cryptococcal meningitis, immunocompromised status, headache Proceduralist: Andres Labrum, PA-C Supervisor (if applicable): Milon Dikes, MD  Consent obtained from [facility] Mother  A time-out was completed verifying correct patient, procedure, site, positioning, and special equipment if applicable. The procedure was described to the patient in the presence of RN. All the indications and potential side effects of the procedure were discussed in details (including but not limited to risk of bleeding, infection, nerve injury and post LP headache). Patient/HCPOA was understanding, agreeable and all questions were answered.  The patient was placed in the Right Lateral decubitus position in a semi-fetal position with help from the nursing staff .The area was cleansed and draped in usual sterile fashion. 1% lidocaine was used anesthetize the surrounding skin area. A 3.5-inch spinal needle was placed in the L3-L4 interspace.   Yellow colored cerebral spinal fluid was obtained  Opening pressure was noted to be 15cm of H2O.   Four tubes were filled with 13 mL of CSF. These were sent for the usual tests-as recommended by ID consult, including 1 tube to be held for further analysis if needed.  Attending Dr Wilford Corner was available for the entire procedure  Estimated Blood Loss: 1ml.  Complications: No complications during the procedure.  Patient was counseled about post-LP headache and how to minimize it. He was advised to report any persistent headache, low back pain, leg tingling, numbness or weakness.   -- Milon Dikes, MD [facility] Pager: [phone] If 7pm to 7am, please call on call as listed on AMION.

## 2018-10-01 NOTE — Progress Notes (Addendum)
Interim note  Consulted for LP INR is elevated at 1.37 which is borderline ( LP contraindicated >1.4)  APTT at 50 Also patient is on heparin sq and recommend atleast 12 hours off before LP   - I have discontinued subcutaneous heparin, this would be a moderate risk procedure but agree benefit outweighs risk since clinical suspicion is high.   - Will recheck Ammonia, last level was 72. Patient does not appear to be on lactulose. This may be alternative explanation for his encephalopathy since he does not appear to be febrile.

## 2018-10-02 DIAGNOSIS — R768 Other specified abnormal immunological findings in serum: Secondary | ICD-10-CM

## 2018-10-02 DIAGNOSIS — B182 Chronic viral hepatitis C: Secondary | ICD-10-CM

## 2018-10-02 DIAGNOSIS — Z95828 Presence of other vascular implants and grafts: Secondary | ICD-10-CM

## 2018-10-02 DIAGNOSIS — B1710 Acute hepatitis C without hepatic coma: Secondary | ICD-10-CM

## 2018-10-02 DIAGNOSIS — B1920 Unspecified viral hepatitis C without hepatic coma: Secondary | ICD-10-CM

## 2018-10-02 LAB — HEPATIC FUNCTION PANEL
ALBUMIN: 1.4 g/dL — AB (ref 3.5–5.0)
ALT: 56 U/L — ABNORMAL HIGH (ref 0–44)
AST: 173 U/L — ABNORMAL HIGH (ref 15–41)
Alkaline Phosphatase: 130 U/L — ABNORMAL HIGH (ref 38–126)
BILIRUBIN INDIRECT: 7 mg/dL — AB (ref 0.3–0.9)
Bilirubin, Direct: 8 mg/dL — ABNORMAL HIGH (ref 0.0–0.2)
TOTAL PROTEIN: 6.4 g/dL — AB (ref 6.5–8.1)
Total Bilirubin: 15 mg/dL — ABNORMAL HIGH (ref 0.3–1.2)

## 2018-10-02 LAB — QUANTIFERON-TB GOLD PLUS: QuantiFERON-TB Gold Plus: NEGATIVE

## 2018-10-02 LAB — CBC
HEMATOCRIT: 28.6 % — AB (ref 39.0–52.0)
HEMOGLOBIN: 9.6 g/dL — AB (ref 13.0–17.0)
MCH: 31.9 pg (ref 26.0–34.0)
MCHC: 33.6 g/dL (ref 30.0–36.0)
MCV: 95 fL (ref 80.0–100.0)
NRBC: 0.4 % — AB (ref 0.0–0.2)
Platelets: 174 10*3/uL (ref 150–400)
RBC: 3.01 MIL/uL — ABNORMAL LOW (ref 4.22–5.81)
RDW: 18.8 % — AB (ref 11.5–15.5)
WBC: 5.4 10*3/uL (ref 4.0–10.5)

## 2018-10-02 LAB — BASIC METABOLIC PANEL
Anion gap: 5 (ref 5–15)
BUN: 7 mg/dL (ref 6–20)
CO2: 20 mmol/L — ABNORMAL LOW (ref 22–32)
CREATININE: 1.07 mg/dL (ref 0.61–1.24)
Calcium: 7.8 mg/dL — ABNORMAL LOW (ref 8.9–10.3)
Chloride: 109 mmol/L (ref 98–111)
Glucose, Bld: 82 mg/dL (ref 70–99)
POTASSIUM: 3.5 mmol/L (ref 3.5–5.1)
SODIUM: 134 mmol/L — AB (ref 135–145)

## 2018-10-02 LAB — QUANTIFERON-TB GOLD PLUS (RQFGPL)
QUANTIFERON TB1 AG VALUE: 0.1 [IU]/mL
QUANTIFERON TB2 AG VALUE: 0.11 [IU]/mL
QuantiFERON Mitogen Value: 0.79 IU/mL
QuantiFERON Nil Value: 0.11 IU/mL

## 2018-10-02 LAB — HEPATITIS PANEL, ACUTE
Hep A IgM: NEGATIVE
Hep B C IgM: NEGATIVE
Hepatitis B Surface Ag: POSITIVE — AB

## 2018-10-02 LAB — MAGNESIUM: MAGNESIUM: 1.7 mg/dL (ref 1.7–2.4)

## 2018-10-02 LAB — PROTIME-INR
INR: 1.46
PROTHROMBIN TIME: 17.6 s — AB (ref 11.4–15.2)

## 2018-10-02 MED ORDER — POTASSIUM CHLORIDE CRYS ER 20 MEQ PO TBCR
40.0000 meq | EXTENDED_RELEASE_TABLET | Freq: Once | ORAL | Status: AC
  Administered 2018-10-02: 40 meq via ORAL
  Filled 2018-10-02: qty 2

## 2018-10-02 MED ORDER — POTASSIUM CHLORIDE CRYS ER 20 MEQ PO TBCR
20.0000 meq | EXTENDED_RELEASE_TABLET | Freq: Once | ORAL | Status: AC
  Administered 2018-10-02: 20 meq via ORAL
  Filled 2018-10-02: qty 1

## 2018-10-02 MED ORDER — GUAIFENESIN-DM 100-10 MG/5ML PO SYRP
5.0000 mL | ORAL_SOLUTION | ORAL | Status: DC | PRN
  Administered 2018-10-02: 5 mL via ORAL
  Filled 2018-10-02: qty 5

## 2018-10-02 MED ORDER — MAGNESIUM SULFATE IN D5W 1-5 GM/100ML-% IV SOLN
1.0000 g | Freq: Once | INTRAVENOUS | Status: AC
  Administered 2018-10-02: 1 g via INTRAVENOUS
  Filled 2018-10-02: qty 100

## 2018-10-02 MED ORDER — PHYTONADIONE 5 MG PO TABS
5.0000 mg | ORAL_TABLET | Freq: Every day | ORAL | Status: AC
  Administered 2018-10-02 – 2018-10-04 (×3): 5 mg via ORAL
  Filled 2018-10-02 (×3): qty 1

## 2018-10-02 NOTE — Progress Notes (Signed)
Called to room by nurse for patient stating he is going to leave AMA. He states he is not pleased with nursing care as he has had blood on his sheets for several days and they have not been changed. He also states that he wants to shower. Patient's grandmother in room is supportive of him staying at the hospital and wants to know if we can move him to a less busy area of the hospital as a compromise to keep him happy.  Patient states that he "cannot do it" referring to staying in the hospital for the entirety of treatment.  I tried to stress the importance of him staying for the duration of treatment in the setting of his low immune system function and multiple infections. I discussed possible complications and risk of being readmitted for longer duration if he were to leave now without full treatment. I voiced my opinion that it would be dangerous for him to go home in current state and we are going to keep him here as short amount of time as possible to keep him safe. Had discussion with grandmother, pt and his sister for ~30 minutes. Patient was addiment that he wanted to leave. I asked if he can stay and we can talk about it more tomorrow and he did not object but also did not agree. I'm not convinced that patient is going to stay for the duration of his treatment.

## 2018-10-02 NOTE — Progress Notes (Addendum)
Daily Rounding Note  10/02/2018, 10:54 AM  LOS: 4 days   SUBJECTIVE:   Chief complaint: jaundice.    Pt feels great.  No nausea, good appetite and eating well.   Brown stool this AM.    OBJECTIVE:         Vital signs in last 24 hours:    Temp:  [97.5 F (36.4 C)-98.9 F (37.2 C)] 98.9 F (37.2 C) (10/09 1045) Pulse Rate:  [89-97] 89 (10/09 1045) Resp:  [18-20] 18 (10/09 1045) BP: (89-114)/(51-75) 97/66 (10/09 1045) SpO2:  [99 %-100 %] 100 % (10/09 1045) Weight:  [86.2 kg] 86.2 kg (10/09 0523) Last BM Date: 09/30/18 Filed Weights   09/30/18 0414 10/01/18 0519 10/02/18 0523  Weight: 83.1 kg 83.7 kg 86.2 kg   General: still icteric.  Comfortable.  Alert   Heart: RRR Chest: clear bil.   Abdomen: soft, NT, ND.  No HSM  Extremities: no CCE Neuro/Psych:  Alert, appropriate.  Not disoriented.  No tremor.    Intake/Output from previous day: 10/08 0701 - 10/09 0700 In: 1497.5 [P.O.:240; I.V.:589.4; IV Piggyback:668.1] Out: 300 [Urine:300]  Intake/Output this shift: Total I/O In: 240 [P.O.:240] Out: 400 [Urine:400]  Lab Results: Recent Labs    09/30/18 0531 10/01/18 0644 10/02/18 0401  WBC 3.6* 7.4 5.4  HGB 11.3* 10.7* 9.6*  HCT 33.2* 30.8* 28.6*  PLT 194 182 174   BMET Recent Labs    09/30/18 0531 10/01/18 0644 10/02/18 0401  NA 134* 134* 134*  K 2.8* 3.4* 3.5  CL 102 109 109  CO2 24 21* 20*  GLUCOSE 61* 78 82  BUN 5* <5* 7  CREATININE 1.16 1.16 1.07  CALCIUM 8.1* 7.6* 7.8*   LFT Recent Labs    09/30/18 0531 10/01/18 0644 10/02/18 0401  PROT 7.4 6.5 6.4*  ALBUMIN 1.6* 1.4* 1.4*  AST 247* 189* 173*  ALT 75* 59* 56*  ALKPHOS 143* 134* 130*  BILITOT 22.0* 17.9* 15.0*  BILIDIR 15.8*  --  8.0*  IBILI  --   --  7.0*   PT/INR Recent Labs    10/01/18 0728 10/02/18 0401  LABPROT 17.4* 17.6*  INR 1.43 1.46   Hepatitis Panel No results for input(s): HEPBSAG, HCVAB, HEPAIGM, HEPBIGM in  the last 72 hours.  Studies/Results: Ct Head Wo Contrast  Result Date: 09/30/2018 CLINICAL DATA:  Headache and blurred vision. History of AIDS and cryptococcal meningitis. EXAM: CT HEAD WITHOUT CONTRAST TECHNIQUE: Contiguous axial images were obtained from the base of the skull through the vertex without intravenous contrast. COMPARISON:  01/08/2018 FINDINGS: Brain: The ventriculostomy catheter on the prior study has been removed in the interim with gliosis noted along the catheter track. Gas within the lateral ventricles has resolved with decreased distention of the frontal horns. Mild diffuse ventriculomegaly has otherwise not significantly changed. Hypoattenuation is present in the right greater than left cerebellar hemispheres, better demonstrated today due to greater beam hardening on the prior CT though corresponding to areas of abnormal diffusion signal and edema on a 05/25/2017 brain MRI. Scattered hypodensities in the supratentorial brain reflective of encephalomalacia also correspond to abnormalities on the 2018 MRI with most notable areas of cortical encephalomalacia being in the left occipital lobe, parasagittal parietal lobes, and anterior left frontal lobe. 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 No definite acute infarct, intracranial hemorrhage, mass, midline shift, or extra-axial fluid collection is identified. Vascular: No hyperdense vessel. Skull: No fracture or  suspicious osseous lesion. Sinuses/Orbits: Right maxillary sinus mucosal thickening. Clear mastoid air cells. Unremarkable orbits. Other: None. IMPRESSION: 1. No evidence of acute intracranial abnormality. 2. Interval removal of ventriculostomy catheter with decreased distention of the frontal horns following resolution of pneumocephalus. Otherwise unchanged mild ventriculomegaly. 3. Areas of encephalomalacia in both cerebral and cerebellar hemispheres corresponding to insults on a 2018 brain MRI.  Electronically Signed   By: Logan Bores M.D.   On: 09/30/2018 17:44   [place] Ekg Site Rite  Result Date: 10/01/2018 If Site Rite image not attached, placement could not be confirmed due to current cardiac rhythm.  Scheduled Meds: . Chlorhexidine Gluconate Cloth  6 each Topical Q0600  . dextrose  10 mL Intravenous Q24H  . dextrose  10 mL Intravenous Q24H  . flucytosine  25 mg/kg Oral Q6H  . lactulose  10 g Oral Daily  . levETIRAcetam  1,000 mg Oral BID  . lidocaine (PF)  5 mL Intradermal Once  . LORazepam  0.5 mg Oral Once  . nicotine  7 mg Transdermal Daily  . phytonadione  5 mg Oral Daily  . potassium chloride  20 mEq Oral Once  . sodium chloride  500 mL Intravenous Q24H  . sodium chloride  500 mL Intravenous Q24H  . sodium chloride flush  10-40 mL Intracatheter Q12H   Continuous Infusions: . sodium chloride 500 mL/hr at 10/01/18 1843  . amphotericin  B  Liposome (AMBISOME) ADULT IV Stopped (10/02/18 0000)   PRN Meds:.sodium chloride, acetaminophen, diphenhydrAMINE **OR** diphenhydrAMINE, guaiFENesin-dextromethorphan, ibuprofen, meperidine (DEMEROL) injection, sodium chloride flush   ASSESMENT:   *    Jaundice.   HBV DNA SERPL PCR-ACNC IU/mL 810   HBV DNA SERPL PCR-LOG IU log10 IU/mL 2.908   Acute hepatitis panel in process. HCV antibody strongly positive (not been checked since it was negative in 2013).  HCV RNA quant ordered Is this acute Hepatitis C?.  This is not acute Hep B reactivation.   LFTs: T bili/alk phos/transaminases ALT trending improved. Ammonia 72, started on low dose lactulose.    *   Normocytic anemia.  *    Mild coagulopathy, most likely due to malnutrition.  Vitamin K 10 mg p.o. once on 10/8  *    Cryptococcal meningitis.    PLAN   *    We will continue vitamin K 5 mg p.o. daily for 3 additional days. LFTs, coags in AM      Justin Ball  10/02/2018, 10:54 AM Phone [phone]

## 2018-10-02 NOTE — Progress Notes (Signed)
[facility]  [facility] will provide [facility] services for pt at DC to home when deemed ready by ID and hospital teams.  If patient discharges after hours, please call [phone].   Justin Ball 10/02/2018, 11:35 AM

## 2018-10-02 NOTE — Progress Notes (Addendum)
Pharmacy Antibiotic Note  Justin Ball is a 27 y.o. 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 Pharmacy has been consulted for Ambisome and flucytosine dosing, as well as electrolyte monitoring. WBC down 7.4>>5.4, currently AF. Scr remains stable at 1.07, estimated CrCl > 100 mL/min. K 3.5, Mg 1.7 this AM. Receiving pre- and post-hydration appropriately.  Plan: Continue Ambisome 350mg  (~4 mg/kg) IV q24h Continue flucytosine 2000mg  (~25 mg/kg) PO q6h KCl 40 mEq PO x1 this AM and 20 mEq PO x1 this PM ordered per primary  Mg 1g IV x1 ordered per primary Daily BMET and Mg while on Ambisome Will continue to follow renal function and replace electrolytes as appropriate   Height: 6\' 1"  (185.4 cm) Weight: 190 lb 1.6 oz (86.2 kg) IBW/kg (Calculated) : 79.9  Temp (24hrs), Avg:98 F (36.7 C), Min:97.5 F (36.4 C), Max:98.5 F (36.9 C)  Recent Labs  Lab 09/28/18 1850 09/28/18 1911 09/28/18 2240 09/29/18 0650 09/30/18 0531 10/01/18 0644 10/02/18 0401  WBC 7.2  --   --  5.2 3.6* 7.4 5.4  CREATININE 1.32*  --   --  1.19 1.16 1.16 1.07  LATICACIDVEN  --  1.93* 1.42  --   --   --   --     Estimated Creatinine Clearance: 117.2 mL/min (by C-G formula based on SCr of 1.07 mg/dL).    Allergies  Allergen Reactions  . Acyclovir And Related Itching   Antimicrobials this admission: Ambisome 10/7 >>  Flucytosine 10/7 >>   Microbiology results: 10/8 CSF cx: NG <24 hrs  Thank you for allowing pharmacy to be a part of this patient's care.  [name], PharmD PGY2 Infectious Diseases Pharmacy Resident Phone: [phone] 10/02/2018 10:37 AM

## 2018-10-02 NOTE — Progress Notes (Signed)
Family Medicine Teaching Service Daily Progress Note Intern Pager: [phone]  Patient name: Justin Ball Medical record number: [id number] Date of birth: 1991/06/24 Age: 27 y.o. Gender: male  Primary Care Provider: Patient, No Pcp Per Consultants: GI, ID, neuro Code Status: full  Pt Overview and Major Events to Date:  Admitted: 10/05  Assessment and Plan: Justin R Coleis a 27 y.o.malepresenting with 2 week h/o malaise and jaundice. PMH is significant forchronic hepatitis B, HIV on ART, h/o cryptococcal meningoencephalitis, bipolar d/o.  Acute on chronic Hep B Positive HBV surface Ag. HBV DNA PCR 2.908, 810?? Today, AST decreased to 173. ALT decreased to 56. Total bili improved to 15. Albumin 1.4. Sclera, gingiva, and palmar surfaces remain jaundiced appear unchanged from yesterday. Appears to be at mental baseline this am. Denies abdominal tenderness, no hepatomegaly appreciated. No diarrhea or nausea today. - f/u GI recs: - follow hep B panels - vitals per floor routine - daily CBC - daily LFP - INR monitor  Hepatitis C- neg Ab in 2013. HCV Ab >11 this admission (positive).  - f/u GI recs  Colitis- Remains afebrile. no diarrhea or N/V today - no further workup at this time per GI - regular diet - encourage oral fluid intake  HIV Follows with [facility], last appointment 04/18/2018.Justin Ball, [place] daily. Medications held in setting of QTc prolongation on admission. QTc normal today. CD4 resulted 90, viral load 3,460. Per ID will plan to restart ART soon but holding now. - f/u ID recs  Hypokalemia K 3.4 today. Replete K+ aggressively with amphotericin use per ID - PO this am, this pm - recheck am BMP  Prolonged QTc- resolved - cardiac monitoring - hold QT prolonging medications   AKI- improved Cr 1.07 today, baseline around 0.9. - has PICC line and receiving fluids with IV medications  NormocyticAnemia Hb decreased to 9.6 this am. INR 1.46  and PTT 17.6 Hg has been gradually declining every day. No signs of acute bleed. - am CBC - monitor for signs of bleeding  H/o cryptococcal meningoencephalitis With resultant seizures. 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 Patient denies any stiffness or pain today. LP showed normal opening pressure with yellow tinged/cloudy appearance (xanthochromic). Csf positive for Crypto Ag level 20 yesterday. CSF decreased glucose (39), total protein increased (65). Treating for about a week per ID. PICC line placed for long term antifungal treatment. - f/u ID recs  - amphotericin daily  - flucytosine   - monitor and replete K+, Mg+ aggressively while on tx  - 500cc pre and post ampho dose infusion for kidney protection  - continue to hold ART  - will continue to follow - 1gram IV magnesium ( ) - 50 mEq Potassium PO today (40am, 10pm)  Bipolar d/o Patient states he no longer has bipolar d/o. States he previously saw a doctor for this but doesn't know the name. States he last took seroquel 4 months ago but isn't sure. Noted to be PRN for agitation.  - hold seroquel for now, can add if becomes agitated.  FEN/GI: regular diet PPx: SCDs  Disposition: med surg  Subjective:  Overnight: lost IV and did not get full dose of Mg.  Today: no complaints or concerns.  Objective: Temp:  [97.5 F (36.4 C)-98.5 F (36.9 C)] 98.5 F (36.9 C) (10/09 0523) Pulse Rate:  [89-97] 97 (10/09 0523) Resp:  [18-20] 18 (10/09 0523) BP: (89-114)/(51-75) 114/75 (10/09 0523) SpO2:  [99 %-100 %]  100 % (10/09 0523) Weight:  [86.2 kg] 86.2 kg (10/09 0523)   Physical Exam: General: resting comfortably in bed HEENT: eyes- significant scleral icterus, jaundiced gingival tissue and mucous membranes Cardiovascular: RRR, no murmurs appreciated Respiratory: clear to ascultation bilaterally, no increased WOB Abdomen: soft, non-tender,  non-distended Extremities: no swelling, moving all equally and appropriately Psych: alert and oriented, pleasant and jovial Derm: multiple healed lesions diffusely on limbs  Laboratory: Recent Labs  Lab 09/30/18 0531 10/01/18 0644 10/02/18 0401  WBC 3.6* 7.4 5.4  HGB 11.3* 10.7* 9.6*  HCT 33.2* 30.8* 28.6*  PLT 194 182 174   Recent Labs  Lab 09/30/18 0531 10/01/18 0644 10/02/18 0401  NA 134* 134* 134*  K 2.8* 3.4* 3.5  CL 102 109 109  CO2 24 21* 20*  BUN 5* <5* 7  CREATININE 1.16 1.16 1.07  CALCIUM 8.1* 7.6* 7.8*  PROT 7.4 6.5 6.4*  BILITOT 22.0* 17.9* 15.0*  ALKPHOS 143* 134* 130*  ALT 75* 59* 56*  AST 247* 189* 173*  GLUCOSE 61* 78 82    CD4: 90 Viral 3,460 Hep B panel 810 Hep C detected- quant/genotype pending Magnesium 1.7 Cryptococcal Ag positive 640 Total bilirubin 15 INR: 1.46  Imaging/Diagnostic Tests: LP results in plan  Justin Bock, DO 10/02/2018, 7:30 AM PGY-1, [place] [facility] FPTS Intern pager: [phone], text pages welcome

## 2018-10-02 NOTE — Care Management Note (Addendum)
Case Management Note  Patient Details  Name: Justin Ball MRN: [id number] Date of Birth: Jan 05, 1991  Subjective/Objective:    Jaundice               Action/Plan: Patient lives at home with his grandmother Sue/ HCPOA; patient gave CM permission to talk to her; TCT [name]- patient has a personal care service that provides assistance for 4 hrs 5 days a week; [name] stated that patient left the home to stay with a friend (that he found off the Internet) in [place] [facility] [place] [facility] and did not take his medication for 3 weeks; he would not tell her the address that he was staying at so she could not send the medication to him. [name] verbalized the difficulty she was having due to his friend and hopes that he stays with with her and not leave again. Lots of emotional support given; [name] is agreeable to have [facility] for home IV antibiotics; Pam with [facility] called for arrangement. CM will continue to follow for progression of care. B Khaiden Segreto RN,MHA,BSN  11:40 am- Long, long discussion with the patient about the importance of taking care of himself and not leaving [place] to stay with someone that he barely knows. Patient is in love with this person that he has connected with but states that now he will talk to him on the phone, use facetime only. Lots of emotional support given with strong emphasis on taking care of himself/ health. Alexis Goodell [phone]  Expected Discharge Date:    possibly 10/05/2018              Expected Discharge Plan:  Home w Home Health Services  Discharge planning Services  CM Consult  Post Acute Care Choice:    Choice offered to:  Patient, HC POA / Guardian  HH Arranged:  RN HH Agency:  [facility]  Status of Service:  In process, will continue to follow  Reola Mosher [phone] 10/02/2018, 11:18 AM

## 2018-10-02 NOTE — Progress Notes (Signed)
[facility]  Date of Admission:  09/28/2018   Total days of antibiotics 3        Day 3 L-ampho         Day 3 Flucytosine           Patient ID: Justin Ball is a 27 y.o. 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 Chlorhexidine Gluconate Cloth  6 each Topical Q0600  . dextrose  10 mL Intravenous Q24H  . dextrose  10 mL Intravenous Q24H  . flucytosine  25 mg/kg Oral Q6H  . lactulose  10 g Oral Daily  . levETIRAcetam  1,000 mg Oral BID  . lidocaine (PF)  5 mL Intradermal Once  . LORazepam  0.5 mg Oral Once  . nicotine  7 mg Transdermal Daily  . sodium chloride  500 mL Intravenous Q24H  . sodium chloride  500 mL Intravenous Q24H  . sodium chloride flush  10-40 mL Intracatheter Q12H    SUBJECTIVE: No complaints. Wants to go home. Still wants to take his HIV medications but "forgot to pack them."   Allergies  Allergen Reactions  . Acyclovir And Related Itching    OBJECTIVE: Vitals:   10/01/18 0953 10/01/18 2007 10/01/18 2019 10/02/18 0523  BP: 101/65 (!) 89/51 97/60 114/75  Pulse: 90 89 90 97  Resp:  20  18  Temp:  (!) 97.5 F (36.4 C)  98.5 F (36.9 C)  TempSrc:  Oral  Oral  SpO2: 100% 99% 100% 100%  Weight:    86.2 kg  Height:       Body mass index is 25.08 kg/m.  Physical Exam  Constitutional: He is oriented to person, place, and time. He appears well-developed and well-nourished.  Resting in bed.   HENT:  Mouth/Throat: Oropharynx is clear and moist and mucous membranes are normal. Normal dentition. No dental abscesses.  Eyes: Scleral  icterus (improved ) is present.  Cardiovascular: Normal rate, regular rhythm and normal heart sounds.  Pulmonary/Chest: Effort normal and breath sounds normal.  Abdominal: Soft. He exhibits no distension. There is no tenderness.  Lymphadenopathy:    He has no cervical adenopathy.  Neurological: He is alert and oriented to person, place, and time.  Skin: Skin is warm and dry. No rash noted.  Psychiatric: He has a normal mood and affect. Judgment normal.  Vitals reviewed. RUE PICC - WNL   Lab Results Lab Results  Component Value Date   WBC 5.4 10/02/2018   HGB 9.6 (L) 10/02/2018   HCT 28.6 (L) 10/02/2018   MCV 95.0 10/02/2018   PLT 174 10/02/2018    Lab Results  Component Value Date   CREATININE 1.07 10/02/2018   BUN 7 10/02/2018   NA 134 (L) 10/02/2018   K 3.5 10/02/2018   CL 109 10/02/2018   CO2 20 (L) 10/02/2018    Lab Results  Component Value Date   ALT 56 (H) 10/02/2018   AST 173 (H) 10/02/2018   ALKPHOS 130 (H) 10/02/2018   BILITOT  15.0 (H) 10/02/2018     Microbiology: Recent Results (from the past 240 hour(s))  CSF culture with Stat gram stain     Status: None (Preliminary result)   Collection Time: 10/01/18 10:40 AM  Result Value Ref Range Status   Specimen Description CSF  Final   Special Requests Immunocompromised  Final   Gram Stain   Final    WBC PRESENT, PREDOMINANTLY MONONUCLEAR NO ORGANISMS SEEN CYTOSPIN SMEAR Performed at [facility], [address], [place], [name] [address]    Culture PENDING  Incomplete   Report Status PENDING  Incomplete  Fungus Culture With Stain     Status: None (Preliminary result)   Collection Time: 10/01/18 10:41 AM  Result Value Ref Range Status   Fungus Stain PENDING  Incomplete   Fungus (Mycology) Culture PENDING  Incomplete   Fungal Source CSF  Final    Comment: Performed at [facility], [address], [place], [name] [address]     ASSESSMENT: Justin Ball has no new complaints today. Scleral  icterus improving as well as Tbili (15 today). His HB DNA is pretty low (~800) and unlikely this is contributing to current liver problems. His Hep C antibody however is newly positive (last tested negative 5 years ago). Will check RNA and genotype. Continue to follow LFTs.   Mag 1.7 after getting insufficient dose last PM. K 3.5 today. CBC counts stable on 5FC. Creatinine stable. Continue pre- / post- IVF boluses of L-ampho.   PLAN: 1. Continue L-amphotericin and flucytosine 10 more days.  2. Appreciate pharmacy's assistance with electrolyte replacement  3. Hep C RNA quantitative level and genotype    Rexene Alberts, MSN, NP-C [facility] Cell: [phone] Pager: [phone]  10/02/2018  8:44 AM

## 2018-10-03 DIAGNOSIS — B20 Human immunodeficiency virus [HIV] disease: Secondary | ICD-10-CM

## 2018-10-03 LAB — CBC
HEMATOCRIT: 28.3 % — AB (ref 39.0–52.0)
Hemoglobin: 9.3 g/dL — ABNORMAL LOW (ref 13.0–17.0)
MCH: 31.7 pg (ref 26.0–34.0)
MCHC: 32.9 g/dL (ref 30.0–36.0)
MCV: 96.6 fL (ref 80.0–100.0)
Platelets: 170 10*3/uL (ref 150–400)
RBC: 2.93 MIL/uL — ABNORMAL LOW (ref 4.22–5.81)
RDW: 18.6 % — AB (ref 11.5–15.5)
WBC: 5.6 10*3/uL (ref 4.0–10.5)
nRBC: 0 % (ref 0.0–0.2)

## 2018-10-03 LAB — COMPREHENSIVE METABOLIC PANEL
ALK PHOS: 130 U/L — AB (ref 38–126)
ALT: 61 U/L — ABNORMAL HIGH (ref 0–44)
ANION GAP: 5 (ref 5–15)
AST: 188 U/L — ABNORMAL HIGH (ref 15–41)
Albumin: 1.4 g/dL — ABNORMAL LOW (ref 3.5–5.0)
BILIRUBIN TOTAL: 13.2 mg/dL — AB (ref 0.3–1.2)
BUN: 6 mg/dL (ref 6–20)
CALCIUM: 7.8 mg/dL — AB (ref 8.9–10.3)
CO2: 19 mmol/L — AB (ref 22–32)
CREATININE: 1.01 mg/dL (ref 0.61–1.24)
Chloride: 110 mmol/L (ref 98–111)
Glucose, Bld: 85 mg/dL (ref 70–99)
Potassium: 3.2 mmol/L — ABNORMAL LOW (ref 3.5–5.1)
SODIUM: 134 mmol/L — AB (ref 135–145)
TOTAL PROTEIN: 6.6 g/dL (ref 6.5–8.1)

## 2018-10-03 LAB — VITAMIN B12: VITAMIN B 12: 753 pg/mL (ref 180–914)

## 2018-10-03 LAB — PROTIME-INR
INR: 1.36
PROTHROMBIN TIME: 16.6 s — AB (ref 11.4–15.2)

## 2018-10-03 LAB — HCV RNA QUANT
HCV QUANT: 6610000 [IU]/mL (ref >50)
HCV Quantitative Log: 6.82 log10 IU/mL (ref >1.70)

## 2018-10-03 LAB — MAGNESIUM: Magnesium: 1.8 mg/dL (ref 1.7–2.4)

## 2018-10-03 LAB — FERRITIN: Ferritin: 497 ng/mL — ABNORMAL HIGH (ref 24–336)

## 2018-10-03 MED ORDER — POTASSIUM CHLORIDE CRYS ER 20 MEQ PO TBCR
40.0000 meq | EXTENDED_RELEASE_TABLET | Freq: Two times a day (BID) | ORAL | Status: DC
  Administered 2018-10-03: 40 meq via ORAL
  Filled 2018-10-03: qty 2

## 2018-10-03 MED ORDER — MAGNESIUM SULFATE IN D5W 1-5 GM/100ML-% IV SOLN
1.0000 g | Freq: Once | INTRAVENOUS | Status: AC
  Administered 2018-10-03: 1 g via INTRAVENOUS
  Filled 2018-10-03: qty 100

## 2018-10-03 MED ORDER — POTASSIUM CHLORIDE CRYS ER 20 MEQ PO TBCR
40.0000 meq | EXTENDED_RELEASE_TABLET | Freq: Once | ORAL | Status: AC
  Administered 2018-10-03: 40 meq via ORAL
  Filled 2018-10-03: qty 2

## 2018-10-03 MED ORDER — POTASSIUM CHLORIDE CRYS ER 20 MEQ PO TBCR: 20.0000 meq | EXTENDED_RELEASE_TABLET | Freq: Once | ORAL | Status: DC

## 2018-10-03 NOTE — Progress Notes (Signed)
[facility]  Date of Admission:  09/28/2018   Total days of antibiotics 4        Day 4 L-ampho         Day 4 Flucytosine           Patient ID: Justin Ball is a 27 y.o. 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 Chlorhexidine Gluconate Cloth  6 each Topical Q0600  . dextrose  10 mL Intravenous Q24H  . dextrose  10 mL Intravenous Q24H  . flucytosine  25 mg/kg Oral Q6H  . lactulose  10 g Oral Daily  . levETIRAcetam  1,000 mg Oral BID  . lidocaine (PF)  5 mL Intradermal Once  . LORazepam  0.5 mg Oral Once  . nicotine  7 mg Transdermal Daily  . phytonadione  5 mg Oral Daily  . potassium chloride  40 mEq Oral BID  . sodium chloride  500 mL Intravenous Q24H  . sodium chloride flush  10-40 mL Intracatheter Q12H    SUBJECTIVE: Does not know who I am. Wants to go home and does not understand why he needs to stay. Likes his new room.   Allergies  Allergen Reactions  . Acyclovir And Related Itching    OBJECTIVE: Vitals:   10/02/18 2040 10/02/18 2230 10/03/18 0552 10/03/18 0928  BP: (!) 128/52 132/60 (!) 95/57 (!) 78/48  Pulse: 95 89 98 84  Resp: 16 16 18 11   Temp: 98 F (36.7 C) 98.1 F (36.7 C) 98.2 F (36.8 C)   TempSrc: Axillary Oral Oral   SpO2: 97% 98% 99% (!) 78%  Weight:      Height:       Body mass index is 25.08 kg/m.  Physical Exam  Constitutional: He is oriented to person, place, and time. He appears well-developed and well-nourished.  Resting in bed.   HENT:  Mouth/Throat:  Oropharynx is clear and moist and mucous membranes are normal. Normal dentition. No dental abscesses.  Eyes: Scleral icterus (improved ) is present.  Cardiovascular: Normal rate, regular rhythm and normal heart sounds.  Pulmonary/Chest: Effort normal and breath sounds normal.  Abdominal: Soft. He exhibits no distension. There is no tenderness.  Lymphadenopathy:    He has no cervical adenopathy.  Neurological: He is alert and oriented to person, place, and time.  Skin: Skin is warm and dry. No rash noted.  Psychiatric: He has a normal mood and affect. Judgment normal.  Vitals reviewed. RUE PICC - WNL   Lab Results Lab Results  Component Value Date   WBC 5.6 10/03/2018   HGB 9.3 (L) 10/03/2018   HCT 28.3 (L) 10/03/2018   MCV 96.6 10/03/2018   PLT 170 10/03/2018    Lab Results  Component Value Date   CREATININE 1.01 10/03/2018   BUN 6 10/03/2018   NA 134 (L) 10/03/2018   K 3.2 (L) 10/03/2018   CL 110 10/03/2018   CO2 19 (L) 10/03/2018  Lab Results  Component Value Date   ALT 61 (H) 10/03/2018   AST 188 (H) 10/03/2018   ALKPHOS 130 (H) 10/03/2018   BILITOT 13.2 (H) 10/03/2018     Microbiology: Recent Results (from the past 240 hour(s))  CSF culture with Stat gram stain     Status: None (Preliminary result)   Collection Time: 10/01/18 10:40 AM  Result Value Ref Range Status   Specimen Description CSF  Final   Special Requests Immunocompromised  Final   Gram Stain   Final    WBC PRESENT, PREDOMINANTLY MONONUCLEAR NO ORGANISMS SEEN CYTOSPIN SMEAR    Culture   Final    NO GROWTH 2 DAYS Performed at [name], [address] [place], [name] [address]    Report Status PENDING  Incomplete  Culture, fungus without smear     Status: None (Preliminary result)   Collection Time: 10/01/18 10:40 AM  Result Value Ref Range Status   Specimen Description CSF  Final   Special Requests Immunocompromised  Final   Culture   Final    NO FUNGUS ISOLATED AFTER 1  DAY Performed at [facility], [address] [place], [name] [address]    Report Status PENDING  Incomplete  Fungus Culture With Stain     Status: None (Preliminary result)   Collection Time: 10/01/18 10:41 AM  Result Value Ref Range Status   Fungus Stain PENDING  Incomplete   Fungus (Mycology) Culture PENDING  Incomplete   Fungal Source CSF  Final    Comment: Performed at [facility], [address] [place], [name] [address]     ASSESSMENT: Woodfin has no further headaches or blurry vision since restarting L-ampho/5FC for his cryptococcal disease. Tbili improving daily. Waiting for Hep C RNA - he denies any new sexual partners, IV/INH drug use.   Mag 1.8. (supplemented) K 3.2 (supplemented) stable creatinine.   PLAN: 1. Continue L-amphotericin and flucytosine 14 days (through 10/21) 2. Needs transition to oral fluconazole 400 mg daily at discharge  3. Will plan on resuming his Tivicay/odefsey next week to monitor for iris with re-introduction of HAART 4. Appreciate pharmacy's assistance with electrolyte replacement  5. Hep C RNA quantitative level and genotype    Rexene Alberts, MSN, NP-C [facility] Cell: [phone] Pager: [phone]  10/03/2018  12:10 PM

## 2018-10-03 NOTE — Progress Notes (Signed)
I am not certain but do worry about that. When the RNA comes back I was going to get more in depth about the details.

## 2018-10-03 NOTE — Progress Notes (Signed)
Soft BP's this morning, family med made aware. No new orders, will continue to monitor

## 2018-10-03 NOTE — Progress Notes (Signed)
Pharmacy Antibiotic Note  Justin Ball is a 27 y.o. 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 Pharmacy has been consulted for Ambisome and flucytosine dosing, as well as electrolyte monitoring. WBC stable 5.6, currently AF. Scr remains stable at 1.01, estimated CrCl > 100 mL/min. K 3.2, Mg 1.8 this AM. Receiving pre- and post-hydration appropriately.  Plan: Continue Ambisome 350mg  (~4 mg/kg) IV q24h Continue flucytosine 2000mg  (~25 mg/kg) PO q6h KCl 40 mEq PO x1 this AM ordered per primary  Will schedule KCl 40 mEq PO BID Mg 1g IV x1 ordered per primary Daily BMET and Mg while on Ambisome Will continue to follow renal function and replace electrolytes as appropriate   Height: 6\' 1"  (185.4 cm) Weight: 190 lb 1.6 oz (86.2 kg) IBW/kg (Calculated) : 79.9  Temp (24hrs), Avg:98.3 F (36.8 C), Min:98 F (36.7 C), Max:98.7 F (37.1 C)  Recent Labs  Lab 09/28/18 1911 09/28/18 2240 09/29/18 0650 09/30/18 0531 10/01/18 0644 10/02/18 0401 10/03/18 0408  WBC  --   --  5.2 3.6* 7.4 5.4 5.6  CREATININE  --   --  1.19 1.16 1.16 1.07 1.01  LATICACIDVEN 1.93* 1.42  --   --   --   --   --     Estimated Creatinine Clearance: 124.2 mL/min (by C-G formula based on SCr of 1.01 mg/dL).    Allergies  Allergen Reactions  . Acyclovir And Related Itching   Antimicrobials this admission: Ambisome 10/7 >>  Flucytosine 10/7 >>   Microbiology results: 10/8 CSF cx: NG x2  Thank you for allowing pharmacy to be a part of this patient's care.  [name], PharmD PGY2 Infectious Diseases Pharmacy Resident Phone: [phone] 10/03/2018 12:03 PM

## 2018-10-03 NOTE — Progress Notes (Signed)
Family Medicine Teaching Service Daily Progress Note Intern Pager: [phone]  Patient name: TAELYN NEMES Medical record number: [id number] Date of birth: [birth year] Age: 27 y.o. Gender: male  Primary Care Provider: Patient, No Pcp Per Consultants: GI, ID, neuro Code Status: full  Pt Overview and Major Events to Date:  10/5 Admitted 10/8 Lumbar puncture  Assessment and Plan: Dade R Coleis a 27 y.o.malepresenting with 2 week h/o malaise and jaundice. PMH is significant forchronic hepatitis B, HIV on ART, h/o cryptococcal meningoencephalitis, bipolar d/o.  Acute on chronic Hep B Positive HBV surface Ag. HBV DNA 810. Elevated AST, ALT and bilirubin. Albumin 1.4. Sclera, gingiva, and palmar surfaces remain jaundiced appear unchanged from yesterday. Appears to be at mental baseline this am. Denies abdominal tenderness, no hepatomegaly appreciated. - f/u GI recs: - vitals per floor routine - daily CBC - daily LFP - INR monitor  Hepatitis C- neg Ab in 2013. HCV Ab >11 this admission (positive).  - f/u GI recs - f/u hep C quant  Colitis- Remains afebrile. had multiple episodes of diarrhea overnight and one this morning. He describes it as loose and non bloody. Lactulose likely contributing. - regular diet - encourage oral fluid intake  HIV Follows with [facility], last appointment 04/18/2018.Home meds: Cyndi Bender daily. CD4 resulted 90, viral load 3,460. Per ID will plan to restart ART "next week" but holding now. ID also states 9 days more inpatient treatment. - f/u ID recs  Hypokalemia K 3.2 today. Replete K+ aggressively with amphotericin use per ID - PO this am, this afternoon - recheck am BMP  Prolonged QTc- resolved. No longer tracking. Off telemetry - hold QT prolonging medications   AKI- improved Cr 1.01 today, baseline around 0.9. - has PICC line and receiving fluids with IV medications  NormocyticAnemia- stable. Hb decreased to 9.3 this am.  INR 1.36 and PTT 16.6. Hg has been gradually declining every day. No signs of acute bleed. B12 normal, ferritin high, folate pending. - am CBC - monitor for signs of bleeding - f/u folate results  H/o cryptococcal meningoencephalitis- Patient denies any stiffness or pain today. LP showed normal opening pressure with yellow tinged/cloudy appearance (xanthochromic). Csf positive for Crypto Ag level 20. CSF decreased glucose (39), total protein increased (65). Treating for 14 days (through 10/21). PICC line placed for long term antifungal treatment. Magnesium 1.8 today. Potassium 3.2 today - f/u ID recs  - amphotericin daily  - flucytosine   - monitor and replete K+, Mg+ aggressively while on tx  - 500cc pre and post ampho dose infusion for kidney protection  - continue to hold ART  - will continue to follow - 1gram IV magnesium ( ) - 60 mEq Potassium PO today (40am, 20pm)  Bipolar d/o Patient states he no longer has bipolar d/o. States he last took seroquel 4 months ago but isn't sure. Noted to be PRN for agitation.  - hold seroquel for now, can add if becomes agitated.  FEN/GI: regular diet PPx: SCDs  Disposition: med surg  Subjective:  Overnight: multiple episodes of diarrhea. Moved to new room and had family member bring in his computer  Today: patient wants to go home. States he feels a little better now that he has a new room and his computer.  Objective: Temp:  [98 F (36.7 C)-98.9 F (37.2 C)] 98.2 F (36.8 C) (10/10 0552) Pulse Rate:  [88-98] 98 (10/10 0552) Resp:  [16-18] 18 (10/10 0552) BP: (95-132)/(52-71) 95/57 (10/10 0552) SpO2:  [  97 %-100 %] 99 % (10/10 0552)   Physical Exam: General: resting comfortably in bed HEENT: eyes- significant scleral icterus, jaundiced gingival tissue and mucous membranes- improved from admission Cardiovascular: regular rate with S3 present, no murmurs appreciated Respiratory: clear to ascultation bilaterally, no increased  WOB Abdomen: soft, non-tender, non-distended Extremities: no swelling, moving all equally and appropriately Psych: alert and oriented, pleasant and jovial Derm: multiple healed lesions diffusely on limbs and trunk  Laboratory: Recent Labs  Lab 10/01/18 0644 10/02/18 0401 10/03/18 0408  WBC 7.4 5.4 5.6  HGB 10.7* 9.6* 9.3*  HCT 30.8* 28.6* 28.3*  PLT 182 174 170   Recent Labs  Lab 10/01/18 0644 10/02/18 0401 10/03/18 0408  NA 134* 134* 134*  K 3.4* 3.5 3.2*  CL 109 109 110  CO2 21* 20* 19*  BUN <5* 7 6  CREATININE 1.16 1.07 1.01  CALCIUM 7.6* 7.8* 7.8*  PROT 6.5 6.4* 6.6  BILITOT 17.9* 15.0* 13.2*  ALKPHOS 134* 130* 130*  ALT 59* 56* 61*  AST 189* 173* 188*  GLUCOSE 78 82 85    CD4: 90 Viral 3,460 Hep B panel 810 Hep C detected- quant/genotype pending Magnesium 1.8 Cryptococcal Ag positive 640 Total bilirubin 15 INR: 1.46  Imaging/Diagnostic Tests: LP results in plan  Leeroy Bock, DO 10/03/2018, 7:53 AM PGY-1, [facility] FPTS Intern pager: [phone], text pages welcome

## 2018-10-04 DIAGNOSIS — Z881 Allergy status to other antibiotic agents status: Secondary | ICD-10-CM

## 2018-10-04 DIAGNOSIS — K7290 Hepatic failure, unspecified without coma: Secondary | ICD-10-CM

## 2018-10-04 LAB — BASIC METABOLIC PANEL
ANION GAP: 3 — AB (ref 5–15)
BUN: 5 mg/dL — AB (ref 6–20)
CO2: 20 mmol/L — AB (ref 22–32)
Calcium: 7.8 mg/dL — ABNORMAL LOW (ref 8.9–10.3)
Chloride: 112 mmol/L — ABNORMAL HIGH (ref 98–111)
Creatinine, Ser: 0.94 mg/dL (ref 0.61–1.24)
GFR calc Af Amer: >60 mL/min (ref >60)
GLUCOSE: 75 mg/dL (ref 70–99)
POTASSIUM: 3.6 mmol/L (ref 3.5–5.1)
Sodium: 135 mmol/L (ref 135–145)

## 2018-10-04 LAB — FOLATE RBC
Folate, Hemolysate: 221.5 ng/mL
Folate, RBC: 886 ng/mL (ref >498)
Hematocrit: 25 % — ABNORMAL LOW (ref 37.5–51.0)

## 2018-10-04 LAB — CBC
HEMATOCRIT: 27.9 % — AB (ref 39.0–52.0)
Hemoglobin: 9.5 g/dL — ABNORMAL LOW (ref 13.0–17.0)
MCH: 32.5 pg (ref 26.0–34.0)
MCHC: 34.1 g/dL (ref 30.0–36.0)
MCV: 95.5 fL (ref 80.0–100.0)
Platelets: 150 10*3/uL (ref 150–400)
RBC: 2.92 MIL/uL — AB (ref 4.22–5.81)
RDW: 18.6 % — ABNORMAL HIGH (ref 11.5–15.5)
WBC: 4.9 10*3/uL (ref 4.0–10.5)
nRBC: 0 % (ref 0.0–0.2)

## 2018-10-04 LAB — PROTIME-INR
INR: 1.32
PROTHROMBIN TIME: 16.3 s — AB (ref 11.4–15.2)

## 2018-10-04 LAB — CSF CULTURE W GRAM STAIN: Culture: NO GROWTH

## 2018-10-04 LAB — MAGNESIUM: Magnesium: 2 mg/dL (ref 1.7–2.4)

## 2018-10-04 LAB — CSF CULTURE

## 2018-10-04 LAB — HEMATOLOGY COMMENTS:

## 2018-10-04 MED ORDER — POTASSIUM CHLORIDE CRYS ER 20 MEQ PO TBCR: 40.0000 meq | EXTENDED_RELEASE_TABLET | Freq: Two times a day (BID) | ORAL | Status: DC

## 2018-10-04 MED ORDER — POTASSIUM CHLORIDE CRYS ER 20 MEQ PO TBCR
40.0000 meq | EXTENDED_RELEASE_TABLET | Freq: Once | ORAL | Status: AC
  Administered 2018-10-04: 40 meq via ORAL
  Filled 2018-10-04: qty 2

## 2018-10-04 NOTE — Progress Notes (Signed)
[facility]  Date of Admission:  09/28/2018   Total days of antibiotics 4        Day 4 L-ampho         Day 4 Flucytosine           Patient ID: Justin Ball is a 27 y.o. 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 Chlorhexidine Gluconate Cloth  6 each Topical Q0600  . dextrose  10 mL Intravenous Q24H  . dextrose  10 mL Intravenous Q24H  . flucytosine  25 mg/kg Oral Q6H  . lactulose  10 g Oral Daily  . levETIRAcetam  1,000 mg Oral BID  . lidocaine (PF)  5 mL Intradermal Once  . LORazepam  0.5 mg Oral Once  . nicotine  7 mg Transdermal Daily  . sodium chloride  500 mL Intravenous Q24H  . sodium chloride flush  10-40 mL Intracatheter Q12H    SUBJECTIVE: "When can I go home". No other complaints.   Allergies  Allergen Reactions  . Acyclovir And Related Itching    OBJECTIVE: Vitals:   10/03/18 1644 10/03/18 2112 10/04/18 0501 10/04/18 0816  BP: (!) 79/52 (!) 92/54 (!) 88/61 103/74  Pulse: 83 85 72 83  Resp: 18 18 18 18   Temp:  98.1 F (36.7 C) 98.9 F (37.2 C) 98.1 F (36.7 C)  TempSrc:  Oral  Oral  SpO2: 100% 100% 100% 100%  Weight:  86.3 kg    Height:       Body mass index is 25.1 kg/m.  Physical Exam  Constitutional: He is oriented to person, place, and time. He appears well-developed and well-nourished.  Resting in bed.   HENT:  Mouth/Throat: Oropharynx is clear and moist and mucous membranes are normal. Normal dentition. No dental abscesses.    Eyes: Scleral icterus (improved ) is present.  Cardiovascular: Normal rate, regular rhythm and normal heart sounds.  Pulmonary/Chest: Effort normal and breath sounds normal.  Abdominal: Soft. He exhibits no distension. There is no tenderness.  Lymphadenopathy:    He has no cervical adenopathy.  Neurological: He is alert and oriented to person, place, and time.  Skin: Skin is warm and dry. No rash noted.  Psychiatric: He has a normal mood and affect. Judgment normal.  Vitals reviewed. RUE PICC - WNL   Lab Results Lab Results  Component Value Date   WBC 4.9 10/04/2018   HGB 9.5 (L) 10/04/2018   HCT 27.9 (L) 10/04/2018   MCV 95.5 10/04/2018   PLT 150 10/04/2018    Lab Results  Component Value Date   CREATININE 0.94 10/04/2018   BUN 5 (L) 10/04/2018   NA 135 10/04/2018   K 3.6 10/04/2018   CL 112 (H) 10/04/2018   CO2 20 (L) 10/04/2018    Lab Results  Component Value Date   ALT 61 (H) 10/03/2018   AST 188 (H) 10/03/2018   ALKPHOS 130 (H)  10/03/2018   BILITOT 13.2 (H) 10/03/2018     Microbiology: Recent Results (from the past 240 hour(s))  CSF culture with Stat gram stain     Status: None (Preliminary result)   Collection Time: 10/01/18 10:40 AM  Result Value Ref Range Status   Specimen Description CSF  Final   Special Requests Immunocompromised  Final   Gram Stain   Final    WBC PRESENT, PREDOMINANTLY MONONUCLEAR NO ORGANISMS SEEN CYTOSPIN SMEAR    Culture   Final    NO GROWTH 3 DAYS Performed at [facility], [address], [place], [name] [address]    Report Status PENDING  Incomplete  Culture, fungus without smear     Status: None (Preliminary result)   Collection Time: 10/01/18 10:40 AM  Result Value Ref Range Status   Specimen Description CSF  Final   Special Requests Immunocompromised  Final   Culture   Final    NO FUNGUS ISOLATED AFTER 1 DAY Performed at [facility], [address], [place], [name] [address]    Report Status PENDING   Incomplete  Fungus Culture With Stain     Status: None (Preliminary result)   Collection Time: 10/01/18 10:41 AM  Result Value Ref Range Status   Fungus Stain Final report  Final    Comment: (NOTE) Performed At: [facility] [address] [place], [name] [id number] Jolene Schimke MD WG:[id number]    Fungus (Mycology) Culture PENDING  Incomplete   Fungal Source CSF  Final    Comment: Performed at [facility], [address] [place], [name] [address]  Fungus Culture Result     Status: None   Collection Time: 10/01/18 10:41 AM  Result Value Ref Range Status   Result 1 Comment  Final    Comment: (NOTE) KOH/Calcofluor preparation:  no fungus observed. Performed At: [facility] [address] [place], [name] [id number] Jolene Schimke MD KG:[id number]      ASSESSMENT: Justin Ball has no further headaches or blurry vision since restarting L-ampho/5FC for his cryptococcal disease. Tbili improving. Getting lactulose daily as well for hepatic encephalopathy. Hep C RNA >6 million with genotype pending. Would continue to follow LFTs as this is likely the cause of this acute event. Will need to recheck his HCV RNA again in 6 months to see if he spontaneously clears this. Will start back Odefsey/Tivicay Monday (after 7d of induction for cryptococcal meningitis).   Blurry vision and headaches resolved - continue L-ampho + 5FC through 10/21. Will need to consider repeating his LP at the end of therapy before discharge and transition to oral fluconazole consolidation therapy. His CSF fungal culture is negative to date and initial opening pressure normal range @ 15.   Mag 2.0 today, K 3.6 after supplementation with 40 mEq BID yesterday - in discussion with our pharmacist would keep the 40 mEq BID going during his therapy on L-ampho for suspected ongoing losses.   PLAN: 1. Continue L-amphotericin and flucytosine 14 days (through 10/21) 2. Needs oral fluconazole 400 mg daily at  discharge  3. Will plan on resuming his Tivicay/odefsey Monday to treat HIV and Hep B infections  4. Appreciate pharmacy's assistance with electrolyte replacement  5. Repeat Hep C PCR in 6 months (April/May 2020).   Dr. Ninetta Lights is available for any ID questions over the weekend.   Rexene Alberts, MSN, NP-C [facility] Cell: [phone] Pager: [phone]  10/04/2018  10:47 AM

## 2018-10-04 NOTE — Progress Notes (Addendum)
Pharmacy Antibiotic Note  Justin Ball is a 27 y.o. 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 Pharmacy has been consulted for Ambisome and flucytosine dosing, as well as electrolyte monitoring. WBC down slightly to 4.9, currently AF. Scr remains stable at 0.94, estimated CrCl > 100 mL/min. K 3.6 after 80 mEq yesterday, Mg 2.0 after 1 gm yesterday. Receiving pre- and post-hydration appropriately.Today is day 5/14 of induction therapy.   Plan: Continue Ambisome 350mg  (~4 mg/kg) IV q24h Continue flucytosine 2000mg  (~25 mg/kg) PO q6h Replace K with 40 mEq today Daily BMET and Mg while on Ambisome Will continue to follow renal function and replace electrolytes as appropriate Recommend scheduling potassium depending on patient electrolyte trend    Height: 6\' 1"  (185.4 cm) Weight: 190 lb 4.1 oz (86.3 kg) IBW/kg (Calculated) : 79.9  Temp (24hrs), Avg:98.5 F (36.9 C), Min:98.1 F (36.7 C), Max:98.9 F (37.2 C)  Recent Labs  Lab 09/28/18 1911 09/28/18 2240  09/30/18 0531 10/01/18 0644 10/02/18 0401 10/03/18 0408 10/04/18 0352  WBC  --   --    < > 3.6* 7.4 5.4 5.6 4.9  CREATININE  --   --    < > 1.16 1.16 1.07 1.01 0.94  LATICACIDVEN 1.93* 1.42  --   --   --   --   --   --    < > = values in this interval not displayed.    Estimated Creatinine Clearance: 133.4 mL/min (by C-G formula based on SCr of 0.94 mg/dL).    Allergies  Allergen Reactions  . Acyclovir And Related Itching   Antimicrobials this admission: Ambisome 10/7 >>  Flucytosine 10/7 >>   Microbiology results: 10/8 CSF cx: NG x2  Thank you for allowing pharmacy to be a part of this patient's care.  Sharin Mons, PharmD, BCPS Infectious Diseases Clinical Pharmacist Phone: [phone] 10/04/2018 10:55 AM

## 2018-10-04 NOTE — Progress Notes (Signed)
Family Medicine Teaching Service Daily Progress Note Intern Pager: [phone]  Patient name: Justin Ball Medical record number: [id number] Date of birth: 1991/02/02 Age: 27 y.o. Gender: male  Primary Care Provider: Patient, No Pcp Per Consultants: GI, ID, neuro Code Status: full  Pt Overview and Major Events to Date:  10/5 Admitted 10/8 Lumbar puncture  Assessment and Plan: Desmond R Coleis a 27 y.o.malepresenting with 2 week h/o malaise and jaundice. PMH is significant forchronic hepatitis B, HIV on ART, h/o cryptococcal meningoencephalitis, bipolar d/o.   Acute Hep C infection- patient has newly diagnosed Hep C infection likely causing the acute jaundice for presentation. Hep C level 6.820, genotype pending -f/u genotype - f/u ID recs  Chronic Hep B Positive HBV surface Ag. HBV DNA 810. Elevated AST, ALT and bilirubin.Sclera, gingiva, and palmar surfaces jaundice has greatly improved since admission. Appears to be at mental baseline this am. Denies abdominal tenderness, no hepatomegaly appreciated. - f/u GI recs: - vitals per floor routine - daily CBC - daily LFP - INR monitor  Colitis- Remains afebrile. No more episodes of diarrhea. - regular diet - encourage oral fluid intake  HIV Follows with [facility], last appointment 04/18/2018.Home meds: Cyndi Bender daily. CD4 resulted 90, viral load 3,460. Per ID will plan to restart ART "next week" but holding now. ID also states 9 days more inpatient treatment. ID has not made any recommendations on prophylaxis treatments since patient has greatly decreased CD4 count. Would appreciate recommendations. - f/u ID recs  Hypokalemia K 3.6 today. K+ repletion will be monitored and repleted by pharmacy per amphotericin protocol. Clarified with pharmacist today that they would be doing the repletion so we do not overly correct. - continue to monitor daily  Prolonged QTc- resolved. No longer tracking. Off telemetry - hold QT  prolonging medications   AKI- resolved Cr 0.94 today, baseline around 0.9. - has PICC line and receiving fluids with IV medications  NormocyticAnemia- stable. Hb increased to 9.5 this am. INR 1.32 and PTT 16.3. No signs of acute bleed. B12 normal, ferritin high, folate still pending. - am CBC - monitor for signs of bleeding - f/u folate results  H/o cryptococcal meningoencephalitis- Patient denies any stiffness or pain today. LP showed normal opening pressure with yellow tinged/cloudy appearance (xanthochromic). Csf positive for Crypto Ag level 20. CSF decreased glucose (39), total protein increased (65). Treating for 14 days (through 10/21). PICC line placed for long term antifungal treatment. Magnesium 2.0 today. Potassium 3.2 today - f/u ID recs  - amphotericin daily  - flucytosine   - monitor and replete K+, Mg+ aggressively while on tx  - 500cc pre and post ampho dose infusion for kidney protection  - continue to hold ART  - will continue to follow - electrolyte replenishment by pharmacy  Hypotension- patient has had low blood pressures for 24hours. Patient is asymptomatic and mentation at baseline. He has had intermittent diarrhea which may be contributing. He received 500cc bolus with antifungal which may be improving. Will need close monitoring. - monitor blood pressures - low threshold to get a blood Cx if fevers - IV bolus if needed  Bipolar d/o Patient states he no longer has bipolar d/o. States he last took seroquel 4 months ago but isn't sure. Noted to be PRN for agitation.  - hold seroquel for now, can add if becomes agitated.  FEN/GI: regular diet PPx: SCDs  Disposition: med surg  Subjective:  Overnight: No acute events, no diarrhea  Today: patient wants to  go home still. He states that he is feeling well and denies any headache, vision changes, chest pain, lightheadedness, or dizziness at rest or with standing.  Objective: Temp:  [98.1 F (36.7 C)-98.9  F (37.2 C)] 98.9 F (37.2 C) (10/11 0501) Pulse Rate:  [72-92] 72 (10/11 0501) Resp:  [11-18] 18 (10/11 0501) BP: (78-92)/(48-61) 88/61 (10/11 0501) SpO2:  [78 %-100 %] 100 % (10/11 0501) Weight:  [86.3 kg] 86.3 kg (10/10 2112)   Physical Exam: General: resting comfortably in bed HEENT: eyes- significant scleral icterus, jaundiced gingival tissue and mucous membranes- improved from admission Cardiovascular: regular rate with S3 present, no murmurs appreciated Respiratory: clear to ascultation bilaterally, no increased WOB Abdomen: soft, non-tender, non-distended Extremities: no swelling, moving all equally and appropriately Psych: alert and oriented, non-anxious Derm: multiple healed lesions diffusely on limbs and trunk (patient states it is from mosquito bites)  Laboratory: Recent Labs  Lab 10/02/18 0401 10/03/18 0408 10/04/18 0352  WBC 5.4 5.6 4.9  HGB 9.6* 9.3* 9.5*  HCT 28.6* 28.3* 27.9*  PLT 174 170 150   Recent Labs  Lab 10/01/18 0644 10/02/18 0401 10/03/18 0408 10/04/18 0352  NA 134* 134* 134* 135  K 3.4* 3.5 3.2* 3.6  CL 109 109 110 112*  CO2 21* 20* 19* 20*  BUN <5* 7 6 5*  CREATININE 1.16 1.07 1.01 0.94  CALCIUM 7.6* 7.8* 7.8* 7.8*  PROT 6.5 6.4* 6.6  --   BILITOT 17.9* 15.0* 13.2*  --   ALKPHOS 134* 130* 130*  --   ALT 59* 56* 61*  --   AST 189* 173* 188*  --   GLUCOSE 78 82 85 75    CD4: 90 Viral 3,460 Hep B panel 810 Hep C detected- quant= 6.8, genotype pending Magnesium 2.0 Cryptococcal Ag positive 640 Total bilirubin 15 INR: 1.32  Imaging/Diagnostic Tests: LP results in plan  Leeroy Bock, DO 10/04/2018, 7:51 AM PGY-1, [facility] FPTS Intern pager: [phone], text pages welcome

## 2018-10-04 NOTE — Progress Notes (Signed)
          Daily Rounding Note  10/04/2018, 2:45 PM  LOS: 6 days   SUBJECTIVE:   Chief complaint: Jaundice.    Jaundice persists.  His appetite is good when he gets the food he likes.  No nausea.  No abdominal pain. Asking when he is going to be able to go home.  OBJECTIVE:         Vital signs in last 24 hours:    Temp:  [98.1 F (36.7 C)-98.9 F (37.2 C)] 98.1 F (36.7 C) (10/11 0816) Pulse Rate:  [72-85] 83 (10/11 0816) Resp:  [18] 18 (10/11 0816) BP: (79-103)/(52-74) 103/74 (10/11 0816) SpO2:  [100 %] 100 % (10/11 0816) Weight:  [86.3 kg] 86.3 kg (10/10 2112) Last BM Date: 10/03/18 Filed Weights   10/01/18 0519 10/02/18 0523 10/03/18 2112  Weight: 83.7 kg 86.2 kg 86.3 kg   General: Scleral icterus.  Does not look acutely ill. Heart: RRR. Chest: Clear bilaterally Abdomen: Soft.  Not tender, not distended.  No HSM, masses, bruits, hernias Extremities: No CCE. Neuro/Psych: Appropriate.  Oriented to place, self, year.  No tremor, no gross neurologic deficits.  Intake/Output from previous day: 10/10 0701 - 10/11 0700 In: 3437.3 [P.O.:1764; I.V.:572.2; IV Piggyback:1101.1] Out: 1150 [Urine:1150]  Intake/Output this shift: Total I/O In: 220 [P.O.:220] Out: 250 [Urine:250]  Lab Results: Recent Labs    10/02/18 0401 10/03/18 0408 10/04/18 0352  WBC 5.4 5.6 4.9  HGB 9.6* 9.3* 9.5*  HCT 28.6* 28.3*  25.0* 27.9*  PLT 174 170 150   BMET Recent Labs    10/02/18 0401 10/03/18 0408 10/04/18 0352  NA 134* 134* 135  K 3.5 3.2* 3.6  CL 109 110 112*  CO2 20* 19* 20*  GLUCOSE 82 85 75  BUN 7 6 5*  CREATININE 1.07 1.01 0.94  CALCIUM 7.8* 7.8* 7.8*   LFT Recent Labs    10/02/18 0401 10/03/18 0408  PROT 6.4* 6.6  ALBUMIN 1.4* 1.4*  AST 173* 188*  ALT 56* 61*  ALKPHOS 130* 130*  BILITOT 15.0* 13.2*  BILIDIR 8.0*  --   IBILI 7.0*  --    PT/INR Recent Labs    10/03/18 0408 10/04/18 0352  LABPROT 16.6*  16.3*  INR 1.36 1.32     ASSESMENT:   *    Jaundice.   HBV DNA SERPL PCR-ACNC IU/mL 810   HBV DNA SERPL PCR-LOG IU log10 IU/mL 2.908   HCV antibody strongly positive (not been checked since it was negative in 2013).   HCV RNA quant [id number] c/w acute vs reactivated Hep C This is not acute Hep B reactivation.   LFTs: overall improved.   Ammonia 72, started on low dose lactulose.    *   Normocytic anemia.  *    Mild coagulopathy, most likely due to malnutrition.  Vitamin K 10 mg p.o. once on 10/8  *    Cryptococcal meningitis.     PLAN   *   Mgt of Hep C and B per ID GI signing off,  no need for office follow up      Jennye Moccasin  10/04/2018, 2:45 PM Phone [phone]

## 2018-10-05 LAB — CBC
HCT: 27 % — ABNORMAL LOW (ref 39.0–52.0)
Hemoglobin: 9.1 g/dL — ABNORMAL LOW (ref 13.0–17.0)
MCH: 32.6 pg (ref 26.0–34.0)
MCHC: 33.7 g/dL (ref 30.0–36.0)
MCV: 96.8 fL (ref 80.0–100.0)
NRBC: 0 % (ref 0.0–0.2)
PLATELETS: 160 10*3/uL (ref 150–400)
RBC: 2.79 MIL/uL — AB (ref 4.22–5.81)
RDW: 18.6 % — ABNORMAL HIGH (ref 11.5–15.5)
WBC: 5.1 10*3/uL (ref 4.0–10.5)

## 2018-10-05 LAB — COMPREHENSIVE METABOLIC PANEL
ALBUMIN: 1.5 g/dL — AB (ref 3.5–5.0)
ALT: 60 U/L — ABNORMAL HIGH (ref 0–44)
ANION GAP: 6 (ref 5–15)
AST: 186 U/L — ABNORMAL HIGH (ref 15–41)
Alkaline Phosphatase: 148 U/L — ABNORMAL HIGH (ref 38–126)
BILIRUBIN TOTAL: 10.5 mg/dL — AB (ref 0.3–1.2)
BUN: 8 mg/dL (ref 6–20)
CO2: 19 mmol/L — ABNORMAL LOW (ref 22–32)
Calcium: 8.1 mg/dL — ABNORMAL LOW (ref 8.9–10.3)
Chloride: 113 mmol/L — ABNORMAL HIGH (ref 98–111)
Creatinine, Ser: 1.28 mg/dL — ABNORMAL HIGH (ref 0.61–1.24)
GFR calc Af Amer: >60 mL/min (ref >60)
GFR calc non Af Amer: >60 mL/min (ref >60)
GLUCOSE: 75 mg/dL (ref 70–99)
POTASSIUM: 3.6 mmol/L (ref 3.5–5.1)
SODIUM: 138 mmol/L (ref 135–145)
TOTAL PROTEIN: 6.6 g/dL (ref 6.5–8.1)

## 2018-10-05 LAB — HEPATITIS C GENOTYPE

## 2018-10-05 LAB — PROTIME-INR
INR: 1.28
PROTHROMBIN TIME: 15.9 s — AB (ref 11.4–15.2)

## 2018-10-05 LAB — MAGNESIUM: MAGNESIUM: 1.7 mg/dL (ref 1.7–2.4)

## 2018-10-05 MED ORDER — KETOROLAC TROMETHAMINE 15 MG/ML IJ SOLN
15.0000 mg | Freq: Once | INTRAMUSCULAR | Status: AC
  Administered 2018-10-05: 15 mg via INTRAVENOUS
  Filled 2018-10-05: qty 1

## 2018-10-05 NOTE — Progress Notes (Addendum)
Received page from RN at 8 PM regarding patient c/o severe 10/10 pain at his right antecubital PICC site and back pain about 1 hour into amphotericin B infusion.  Vitals stable and he is afebrile.  Patient appearing uncomfortable and is having some rigors.  Asked nurse to hold antifungal for now.  Discussed with pharmacy as he does not have the expected side effects with this infusion such as fever, chills, nausea, vomiting.  I evaluated patient bedside and he states pain has not improved much since stopping infusion. Exam largely normal and IV site did not appear infiltrated or infected. Discussed with PICC team to keep the line in for now.  Line flushed. Will continue to monitor him overnight.  Fluids now decreased and running at [facility].   Orders given for toradol x1 and demerol for rigors.     Freddrick March MD  [facility] PGY3

## 2018-10-05 NOTE — Progress Notes (Signed)
Family Medicine Teaching Service Daily Progress Note Intern Pager: [phone]  Patient name: Justin Ball Medical record number: [id number] Date of birth: 01-25-1991 Age: 27 y.o. Gender: male  Primary Care Provider: Patient, No Pcp Per Consultants: Gastroenterology, Neurology, Infectious Disease Code Status: Full  Pt Overview and Major Events to Date:  10/05: Admit for malaise and jaundice found to have uncontrolled HIV and HCV 10/08: Received lumbar puncture, positive cryptococcus antigen and was started on antifungals by ID  Assessment and Plan: Justin Ball a 27 y.o.malepresenting with 2 week h/o malaise and jaundice. PMH is significant forchronic hepatitis B, HIV on ART, h/o cryptococcal meningoencephalitis, bipolar d/o.  Cryptococcal meningoencephalitis: Acute.  No signs of AMS or systemic infection.  Positive for cryptococcal antigen level at 20.  Tolerating treatment with antifungal therapy.  CT head negative on admission with signs of interval removal of ventriculostomy catheter otherwise unremarkable for acute findings. - Continue amphotericin and flucytosine for completion of 14-day treatment (10/07-10/21) - ID consulted, appreciate recommendations, see plan for HIV and hepatitis B and see treatment below - Pharmacy managing potassium and magnesium levels while on antifungals, appreciate assistance  Uncontrolled HIV  Hepatitis C  Hepatitis B: Chronic based on labs.  Elevated direct bilirubin consistent with clinical exam.  No signs of hepatic encephalopathy or decompensation.  Genotype 1a.  Likely secondary to history of MSM.  CT of abdomen was suspicious for possible colitis though patient denies abdominal pain or change in bowel movements. - GI consulted, appreciate recommendations, signed-oof with plan for HCV RNA VL at 12 weeks, 6 weeks, and 24 weeks time with abstinence from alcohol and illicit drugs - ID consulted, appreciate recommendations - Treatment plan for  cryptococcus stated above - Resume Tivicay/Odefsey on 10/14 - Will need oral fluconazole 400 mg daily at discharge  NormocyticAnemia: Chronic.  Stable without signs of active bleeding.  Likely secondary to anemia of chronic inflammation given uncontrolled HIV/HCV/HBV. - Continue monitoring for blood loss  Bipolar disorder: Chronic.  Patient denies having bipolar with his last dose of Seroquel 4 months ago. - Continue monitoring mood and affect  FEN/GI: Regular diet PPx: SCDs  Disposition: Pending completion of antifungals for cryptococcus   Subjective:  Patient states he feels today he denies chest pain, shortness of breath, abdominal pain, nausea or vomiting, diarrhea, headache, photophobia, change in vision.  He wishes to get outside at some point during his hospitalization.  He did seem very upset about having to stay for antifungal therapy states he understands importance of receiving treatment.  Objective: Temp:  [98.2 F (36.8 C)-98.4 F (36.9 C)] 98.2 F (36.8 C) (10/12 0504) Pulse Rate:  [85-99] 85 (10/12 0504) Resp:  [14-18] 16 (10/12 0504) BP: (84-123)/(52-81) 96/54 (10/12 0700) SpO2:  [100 %] 100 % (10/12 0504) Weight:  [89.4 kg] 89.4 kg (10/11 2022) Physical Exam: General: well nourished, well developed, NAD with non-toxic appearance HEENT: normocephalic, atraumatic, moist mucous membranes, scleral icterus present Neck: supple, non-tender without lymphadenopathy Cardiovascular: regular rate and rhythm without murmurs, S3 present Lungs: clear to auscultation bilaterally with normal work of breathing Abdomen: soft, non-tender, non-distended, normoactive bowel sounds Skin: warm, dry, no rashes or lesions, cap refill < 2 seconds Extremities: warm and well perfused, normal tone, no edema Neuro: grossly intact throughout, clear and coherent speech  Laboratory: Recent Labs  Lab 10/03/18 0408 10/04/18 0352 10/05/18 0412  WBC 5.6 4.9 5.1  HGB 9.3* 9.5* 9.1*  HCT  28.3*  25.0* 27.9* 27.0*  PLT 170 150 160  Recent Labs  Lab 10/02/18 0401 10/03/18 0408 10/04/18 0352 10/05/18 0412  NA 134* 134* 135 138  K 3.5 3.2* 3.6 3.6  CL 109 110 112* 113*  CO2 20* 19* 20* 19*  BUN 7 6 5* 8  CREATININE 1.07 1.01 0.94 1.28*  CALCIUM 7.8* 7.8* 7.8* 8.1*  PROT 6.4* 6.6  --  6.6  BILITOT 15.0* 13.2*  --  10.5*  ALKPHOS 130* 130*  --  148*  ALT 56* 61*  --  60*  AST 173* 188*  --  186*  GLUCOSE 82 85 75 75   CD4 T cell Abs: 90 CD4% helper T-cell: 8 HIV 1 RNA quant: 3460 HCV antibody: >11.0 HCV genotype: 1A HCV RNA quant: 6,610,000 HBsAg: Positive HBV DNA PCR: 810 QuantiFERON gold: Negative Cryptococcal antigen: Positive, antigen titers 640 CSF labs: Xanthochromatic, protein 65, glucose 39, no growth at 3 days, Direct bilirubin: 16.0 Ammonia: 72 Urinalysis: Unremarkable I-STAT lactic acid: 1.42 Folate: 221 (WNL) B12: 753 (WNL) Ferritin: 497 (H)  Imaging/Diagnostic Tests: EKG 12-Lead: NSR without ST changes or t-wave abnormalities or heart blocks, with QTc 435  CT HEAD WITHOUT CONTRAST (09/30/2018) 1. No evidence of acute intracranial abnormality. 2. Interval removal of ventriculostomy catheter with decreased distention of the frontal horns following resolution of pneumocephalus. Otherwise unchanged mild ventriculomegaly. 3. Areas of encephalomalacia in both cerebral and cerebellar hemispheres corresponding to insults on a 2018 brain MRI.  CT ABDOMEN AND PELVIS WITH CONTRAST (09/28/2018) 1. Mild circumferential wall thickening noted in the right colon. Imaging features suspicious for infectious/inflammatory colitis. 2. Otherwise unremarkable study.  Wendee Beavers, DO 10/05/2018, 9:22 AM PGY-3, [facility] FPTS Intern pager: [phone], text pages welcome

## 2018-10-05 NOTE — Progress Notes (Signed)
[name] Cr noted to increase, however is in range with his Cr while in hospital  No change in ampho and 5FC at this point. Needs 9 more days Bili continues to improve.

## 2018-10-06 LAB — CBC
HEMATOCRIT: 26.6 % — AB (ref 39.0–52.0)
Hemoglobin: 8.8 g/dL — ABNORMAL LOW (ref 13.0–17.0)
MCH: 32 pg (ref 26.0–34.0)
MCHC: 33.1 g/dL (ref 30.0–36.0)
MCV: 96.7 fL (ref 80.0–100.0)
NRBC: 0 % (ref 0.0–0.2)
Platelets: 168 10*3/uL (ref 150–400)
RBC: 2.75 MIL/uL — ABNORMAL LOW (ref 4.22–5.81)
RDW: 18.2 % — AB (ref 11.5–15.5)
WBC: 4.4 10*3/uL (ref 4.0–10.5)

## 2018-10-06 LAB — COMPREHENSIVE METABOLIC PANEL
ALBUMIN: 1.5 g/dL — AB (ref 3.5–5.0)
ALK PHOS: 157 U/L — AB (ref 38–126)
ALT: 63 U/L — ABNORMAL HIGH (ref 0–44)
ANION GAP: 5 (ref 5–15)
AST: 191 U/L — ABNORMAL HIGH (ref 15–41)
BILIRUBIN TOTAL: 9.9 mg/dL — AB (ref 0.3–1.2)
BUN: 6 mg/dL (ref 6–20)
CALCIUM: 8 mg/dL — AB (ref 8.9–10.3)
CO2: 17 mmol/L — ABNORMAL LOW (ref 22–32)
Chloride: 114 mmol/L — ABNORMAL HIGH (ref 98–111)
Creatinine, Ser: 1.1 mg/dL (ref 0.61–1.24)
GFR calc non Af Amer: >60 mL/min (ref >60)
Glucose, Bld: 92 mg/dL (ref 70–99)
POTASSIUM: 3 mmol/L — AB (ref 3.5–5.1)
Sodium: 136 mmol/L (ref 135–145)
TOTAL PROTEIN: 7.1 g/dL (ref 6.5–8.1)

## 2018-10-06 LAB — PROTIME-INR
INR: 1.32
PROTHROMBIN TIME: 16.3 s — AB (ref 11.4–15.2)

## 2018-10-06 LAB — BASIC METABOLIC PANEL
Anion gap: 3 — ABNORMAL LOW (ref 5–15)
BUN: 8 mg/dL (ref 6–20)
CO2: 17 mmol/L — ABNORMAL LOW (ref 22–32)
CREATININE: 1.08 mg/dL (ref 0.61–1.24)
Calcium: 7.6 mg/dL — ABNORMAL LOW (ref 8.9–10.3)
Chloride: 113 mmol/L — ABNORMAL HIGH (ref 98–111)
Glucose, Bld: 90 mg/dL (ref 70–99)
Potassium: 3 mmol/L — ABNORMAL LOW (ref 3.5–5.1)
SODIUM: 133 mmol/L — AB (ref 135–145)

## 2018-10-06 LAB — MAGNESIUM: Magnesium: 1.6 mg/dL — ABNORMAL LOW (ref 1.7–2.4)

## 2018-10-06 MED ORDER — QUETIAPINE FUMARATE 25 MG PO TABS
25.0000 mg | ORAL_TABLET | Freq: Every day | ORAL | Status: DC
  Administered 2018-10-06 – 2018-10-13 (×8): 25 mg via ORAL
  Filled 2018-10-06 (×8): qty 1

## 2018-10-06 MED ORDER — KETOROLAC TROMETHAMINE 15 MG/ML IJ SOLN
15.0000 mg | Freq: Once | INTRAMUSCULAR | Status: AC
  Administered 2018-10-06: 15 mg via INTRAVENOUS
  Filled 2018-10-06: qty 1

## 2018-10-06 MED ORDER — POTASSIUM CHLORIDE 10 MEQ/50ML IV SOLN
10.0000 meq | INTRAVENOUS | Status: AC
  Administered 2018-10-06 (×6): 10 meq via INTRAVENOUS
  Filled 2018-10-06 (×8): qty 50

## 2018-10-06 MED ORDER — NICOTINE 7 MG/24HR TD PT24
7.0000 mg | MEDICATED_PATCH | Freq: Every day | TRANSDERMAL | Status: DC
  Administered 2018-10-07 – 2018-10-10 (×4): 7 mg via TRANSDERMAL
  Filled 2018-10-06 (×4): qty 1

## 2018-10-06 MED ORDER — MAGNESIUM SULFATE 2 GM/50ML IV SOLN
2.0000 g | Freq: Once | INTRAVENOUS | Status: AC
  Administered 2018-10-06: 2 g via INTRAVENOUS
  Filled 2018-10-06: qty 50

## 2018-10-06 NOTE — Progress Notes (Signed)
Family Medicine Teaching Service Daily Progress Note Intern Pager: [phone]  Patient name: Justin Ball Medical record number: [id number] Date of birth: June 06, 1991 Age: 27 y.o. Gender: male  Primary Care Provider: Patient, No Pcp Per Consultants: Gastroenterology, Neurology, Infectious Disease Code Status: Full  Pt Overview and Major Events to Date:  10/05: Admit for malaise and jaundice found to have uncontrolled HIV and HCV 10/08: Received lumbar puncture, positive cryptococcus antigen and was started on antifungals by ID  Assessment and Plan: Justin Ball a 27 y.o.malepresenting with 2 week h/o malaise and jaundice. PMH is significant forchronic hepatitis B, HIV on ART, h/o cryptococcal meningoencephalitis, bipolar d/o.  Cryptococcal meningoencephalitis: Acute.  No signs of AMS or systemic infection. Positive for cryptococcal antigen level at 20. Continue antifungal treatment today. It appears that pharmacy is not replacing electrolytes and K+ is decreased to 3.0 so will replace with IV K+ today per patient request. Magnesium also low at 1.6 so will also replace since I don't see any order for it from pharmacy. - Continue amphotericin and flucytosine for completion of 14-day treatment (10/07-10/21) - f/u ID recs - Pharmacy managing potassium and magnesium levels while on antifungals, appreciate assistance - today I replaced K+ with IV and Mg+ 2g IV  Uncontrolled HIV  Hepatitis C  Hepatitis B: Patient denies any more diarrhea. Jaundice is greatly improved since admission. Mental status is at baseline. Currently holding ART therapy per ID recs. Would appreciate ID recommendations for infection prophylaxis given CD4 count <100. GI recommends HCV treatment plan of repeat RNA VL at 6 weeks, 12 weeks, and 24 weeks time with abstinence from alcohol and illicit drugs - f/u ID recs - Resume Tivicay/Odefsey on 10/14 per ID - continue to monitor  NormocyticAnemia: Chronic.   Stable without signs of active bleeding.  Likely secondary to anemia of chronic inflammation given uncontrolled HIV/HCV/HBV. Hgb 8.8 today - Continue monitoring for blood loss - CBC daily since hgb still continue to drop gradually every day  Bipolar disorder: Chronic.  Patient denies having bipolar with his last dose of Seroquel 4 months ago. - Continue monitoring mood and affect  FEN/GI: Regular diet PPx: SCDs (patient not wearing)  Disposition: Pending completion of antifungals for cryptococcus   Subjective:  Overnight: patient complained of 10/10 pain at PICC line site and lower back while receiving amphotericin treatment which was improved with discontinuation of treatment and given demerol and Toradol. PICC team inspected the line and did not see any issues.   Today: patient is not experiencing any pain at PICC line site or back. He still wants to go home or outside. He likes his new room because he can rest and it's quieter. He is willing to retry to amphotericin treatment again today but doesn't want both IVs running at the same time. He would prefer IV potassium 2/2 difficulties swallowing the pills.   Objective: Temp:  [98 F (36.7 C)-98.8 F (37.1 C)] 98 F (36.7 C) (10/13 0355) Pulse Rate:  [84-92] 84 (10/13 0355) Resp:  [16-20] 16 (10/13 0355) BP: (95-111)/(54-74) 106/71 (10/13 0355) SpO2:  [99 %-100 %] 100 % (10/13 0355)   Physical Exam: General: well nourished, well developed, NAD with non-toxic appearance HEENT: normocephalic, atraumatic, moist mucous membranes, minor scleral icterus present Neck: supple, non-tender without lymphadenopathy Cardiovascular: regular rate and rhythm without murmurs, S3 present Lungs: clear to auscultation bilaterally with normal work of breathing Abdomen: soft, non-tender, distended Skin: warm, dry, diffuse small, healed scars on extremities and trunk  Extremities: warm and well perfused, normal tone, no edema Neuro: alert and  initially disoriented to place when first woken up this morning but was able to self orient upon waking up. Unchanged mental status throughout admission.  Laboratory: Recent Labs  Lab 10/04/18 0352 10/05/18 0412 10/06/18 0430  WBC 4.9 5.1 4.4  HGB 9.5* 9.1* 8.8*  HCT 27.9* 27.0* 26.6*  PLT 150 160 168   Recent Labs  Lab 10/03/18 0408 10/04/18 0352 10/05/18 0412 10/06/18 0430  NA 134* 135 138 136  K 3.2* 3.6 3.6 3.0*  CL 110 112* 113* 114*  CO2 19* 20* 19* 17*  BUN 6 5* 8 6  CREATININE 1.01 0.94 1.28* 1.10  CALCIUM 7.8* 7.8* 8.1* 8.0*  PROT 6.6  --  6.6 7.1  BILITOT 13.2*  --  10.5* 9.9*  ALKPHOS 130*  --  148* 157*  ALT 61*  --  60* 63*  AST 188*  --  186* 191*  GLUCOSE 85 75 75 92   CD4 T cell Abs: 90 CD4% helper T-cell: 8 HIV 1 RNA quant: 3460 HCV antibody: >11.0 HCV genotype: 1A HCV RNA quant: 6,610,000 HBsAg: Positive HBV DNA PCR: 810 QuantiFERON gold: Negative Cryptococcal antigen: Positive, antigen titers 640 CSF labs: Xanthochromatic, protein 65, glucose 39, no growth at 3 days, Direct bilirubin: 16.0 Ammonia: 72 Urinalysis: Unremarkable I-STAT lactic acid: 1.42 Folate: 221 (WNL) B12: 753 (WNL) Ferritin: 497 (H)  Imaging/Diagnostic Tests: EKG 12-Lead: NSR without ST changes or t-wave abnormalities or heart blocks, with QTc 435  CT HEAD WITHOUT CONTRAST (09/30/2018) 1. No evidence of acute intracranial abnormality. 2. Interval removal of ventriculostomy catheter with decreased distention of the frontal horns following resolution of pneumocephalus. Otherwise unchanged mild ventriculomegaly. 3. Areas of encephalomalacia in both cerebral and cerebellar hemispheres corresponding to insults on a 2018 brain MRI.  CT ABDOMEN AND PELVIS WITH CONTRAST (09/28/2018) 1. Mild circumferential wall thickening noted in the right colon. Imaging features suspicious for infectious/inflammatory colitis. 2. Otherwise unremarkable study.  Justin Bock,  DO 10/06/2018, 9:31 AM PGY-1, [facility] FPTS Intern pager: [phone], text pages welcome

## 2018-10-07 DIAGNOSIS — B1920 Unspecified viral hepatitis C without hepatic coma: Secondary | ICD-10-CM

## 2018-10-07 LAB — COMPREHENSIVE METABOLIC PANEL
ALK PHOS: 145 U/L — AB (ref 38–126)
ALT: 66 U/L — AB (ref 0–44)
AST: 187 U/L — ABNORMAL HIGH (ref 15–41)
Albumin: 1.6 g/dL — ABNORMAL LOW (ref 3.5–5.0)
Anion gap: 3 — ABNORMAL LOW (ref 5–15)
BUN: 6 mg/dL (ref 6–20)
CHLORIDE: 116 mmol/L — AB (ref 98–111)
CO2: 19 mmol/L — AB (ref 22–32)
CREATININE: 1.05 mg/dL (ref 0.61–1.24)
Calcium: 8 mg/dL — ABNORMAL LOW (ref 8.9–10.3)
GFR calc Af Amer: >60 mL/min (ref >60)
Glucose, Bld: 81 mg/dL (ref 70–99)
Potassium: 3.2 mmol/L — ABNORMAL LOW (ref 3.5–5.1)
Sodium: 138 mmol/L (ref 135–145)
Total Bilirubin: 8.5 mg/dL — ABNORMAL HIGH (ref 0.3–1.2)
Total Protein: 6.9 g/dL (ref 6.5–8.1)

## 2018-10-07 LAB — MAGNESIUM: MAGNESIUM: 1.8 mg/dL (ref 1.7–2.4)

## 2018-10-07 MED ORDER — POTASSIUM CHLORIDE CRYS ER 10 MEQ PO TBCR
40.0000 meq | EXTENDED_RELEASE_TABLET | Freq: Two times a day (BID) | ORAL | Status: DC
  Administered 2018-10-07 – 2018-10-14 (×15): 40 meq via ORAL
  Filled 2018-10-07 (×5): qty 4
  Filled 2018-10-07 (×2): qty 2
  Filled 2018-10-07 (×6): qty 4
  Filled 2018-10-07: qty 2
  Filled 2018-10-07: qty 4

## 2018-10-07 MED ORDER — ENSURE ENLIVE PO LIQD
237.0000 mL | Freq: Two times a day (BID) | ORAL | Status: DC
  Administered 2018-10-07 – 2018-10-14 (×12): 237 mL via ORAL

## 2018-10-07 MED ORDER — DOLUTEGRAVIR SODIUM 50 MG PO TABS
50.0000 mg | ORAL_TABLET | Freq: Every day | ORAL | Status: DC
  Administered 2018-10-07 – 2018-10-14 (×8): 50 mg via ORAL
  Filled 2018-10-07 (×8): qty 1

## 2018-10-07 MED ORDER — MAGNESIUM SULFATE 2 GM/50ML IV SOLN
2.0000 g | Freq: Once | INTRAVENOUS | Status: AC
  Administered 2018-10-07: 2 g via INTRAVENOUS
  Filled 2018-10-07: qty 50

## 2018-10-07 MED ORDER — EMTRICITAB-RILPIVIR-TENOFOV AF 200-25-25 MG PO TABS
1.0000 | ORAL_TABLET | Freq: Every day | ORAL | Status: DC
  Administered 2018-10-08 – 2018-10-14 (×7): 1 via ORAL
  Filled 2018-10-07 (×9): qty 1

## 2018-10-07 NOTE — Progress Notes (Signed)
INFECTIOUS DISEASE PROGRESS NOTE  ID: Justin Ball is a 27 y.o. 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   Abtx:  Anti-infectives (From admission, onward)   Start     Dose/Rate Route Frequency Ordered Stop   09/30/18 2000  amphotericin B liposome (AMBISOME) 350 mg in dextrose 5 % 500 mL IVPB     350 mg 250 mL/hr over 120 Minutes Intravenous Every 24 hours 09/30/18 1655     09/30/18 1800  flucytosine (ANCOBON) capsule 2,000 mg     25 mg/kg  83.1 kg Oral Every 6 hours 09/30/18 1655        Medications:  Scheduled: . Chlorhexidine Gluconate Cloth  6 each Topical Q0600  . dextrose  10 mL Intravenous Q24H  . dextrose  10 mL Intravenous Q24H  . flucytosine  25 mg/kg Oral Q6H  . lactulose  10 g Oral Daily  . levETIRAcetam  1,000 mg Oral BID  . lidocaine (PF)  5 mL Intradermal Once  . LORazepam  0.5 mg Oral Once  . nicotine  7 mg Transdermal Daily  . potassium chloride  40 mEq Oral BID  . QUEtiapine  25 mg Oral QHS  . sodium chloride  500 mL Intravenous Q24H  . sodium chloride flush  10-40 mL Intracatheter Q12H    Objective: Vital signs in last 24 hours: Temp:  [98.2 F (36.8 C)-98.4 F (36.9 C)] 98.3 F (36.8 C) (10/14 0741) Pulse Rate:  [85-97] 85 (10/14 0741) Resp:  [17-19] 17 (10/14 0741) BP: (85-116)/(49-79) 93/58 (10/14 0741) SpO2:  [98 %-100 %] 98 % (10/14 0741)   General appearance: fatigued and no distress Cardio: regular rate and rhythm GI: normal  findings: bowel sounds normal and soft, non-tender  Lab Results Recent Labs    10/05/18 0412 10/06/18 0430 10/06/18 2246 10/07/18 0330  WBC 5.1 4.4  --   --   HGB 9.1* 8.8*  --   --   HCT 27.0* 26.6*  --   --   NA 138 136 133* 138  K 3.6 3.0* 3.0* 3.2*  CL 113* 114* 113* 116*  CO2 19* 17* 17* 19*  BUN 8 6 8 6   CREATININE 1.28* 1.10 1.08 1.05   Liver Panel Recent Labs    10/06/18 0430 10/07/18 0330  PROT 7.1 6.9  ALBUMIN 1.5* 1.6*  AST 191* 187*  ALT 63* 66*  ALKPHOS 157* 145*  BILITOT 9.9* 8.5*   Sedimentation Rate No results for input(s): ESRSEDRATE in the last 72 hours. C-Reactive Protein No results for input(s): CRP in the last 72 hours.  Microbiology: Recent Results (from the past 240 hour(s))  CSF culture with Stat gram stain     Status: None   Collection Time: 10/01/18 10:40 AM  Result Value Ref Range Status   Specimen Description CSF  Final   Special Requests Immunocompromised  Final   Gram Stain   Final    WBC PRESENT, PREDOMINANTLY MONONUCLEAR  NO ORGANISMS SEEN CYTOSPIN SMEAR    Culture   Final    NO GROWTH 3 DAYS Performed at [facility], [address], [place], [name] [address]    Report Status 10/04/2018 FINAL  Final  Culture, fungus without smear     Status: None (Preliminary result)   Collection Time: 10/01/18 10:40 AM  Result Value Ref Range Status   Specimen Description CSF  Final   Special Requests Immunocompromised  Final   Culture   Final    NO FUNGUS ISOLATED AFTER 5 DAYS Performed at [facility], [address], [place], [name] [address]    Report Status PENDING  Incomplete  Fungus Culture With Stain     Status: None (Preliminary result)   Collection Time: 10/01/18 10:41 AM  Result Value Ref Range Status   Fungus Stain Final report  Final    Comment: (NOTE) Performed At: [facility] [address] [place], [name] [id number] Jolene Schimke MD WG:[id number]    Fungus (Mycology) Culture PENDING   Incomplete   Fungal Source CSF  Final    Comment: Performed at [facility], [address] [place], [name] [address]  Fungus Culture Result     Status: None   Collection Time: 10/01/18 10:41 AM  Result Value Ref Range Status   Result 1 Comment  Final    Comment: (NOTE) KOH/Calcofluor preparation:  no fungus observed. Performed At: [facility] [address] [place], [name] [id number] Jolene Schimke MD KG:[id number]     Studies/Results: No results found.   Assessment/Plan: AIDS  Cryptococal meningitis Hepatitis C (acute?)  Total days of antibiotics: 9 ampho/5FC  Will re-start his ART today. Odefsey and tivicay Cr remains stable Appreciate pharm help with managing his electrolytes Watch for IRIS (immune-reconstituion syndrome) with restarting his ART and concurrent crypto.  His bili continues to improve         Johny Sax MD, FACP Infectious Diseases (pager) [phone] [email or web address] 10/07/2018, 10:30 AM  LOS: 9 days

## 2018-10-07 NOTE — Progress Notes (Signed)
Initial Nutrition Assessment  DOCUMENTATION CODES:   Not applicable  INTERVENTION:   Ensure Enlive po BID, each supplement provides 350 kcal and 20 grams of protein  NUTRITION DIAGNOSIS:   Increased nutrient needs related to acute illness as evidenced by estimated needs.  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 Presented 10/5 with malaise and jaundice. Admitted for acute on chronic Hep B and colitis.    10/05- found to have uncontrolled HIV and HCV 10/08- positive cryptococcus meningoencephalitis, started on antifungals  Pt endorses having a decrease in appetite starting 2-3 weeks ago due to feelings of fatigue. States during this time period he would eat one large meal that consisted of "junk food" (pizza, wings, fast food burgers). Pt reports since being admitted he has consumed 3 meals daily. Meal completions charted as 75-100% for his last 7 meal. Pt consumed chicken fingers with fries for lunch without any complication. He states he was receiving Ensure at the beginning of admission but this has since stopped. He requested RD to re-order them.   Pt endorses a UBW of 210 lb and a recent wt loss of 10 lb. Records indicate pt has gained weight from 166 lb a year ago to 197 lb this admission. Nutrition-Focused physical exam completed.   Medications reviewed and include: lactulose, 40 mEq KCl BID Labs reviewed: K 3.2 (L) corrected calcium 9.9 (wdl)  NUTRITION - FOCUSED PHYSICAL EXAM:    Most Recent Value  Orbital Region  No depletion  Upper Arm Region  Mild depletion  Thoracic and Lumbar Region  No depletion   Buccal Region  No depletion  Temple Region  No depletion  Clavicle Bone Region  Mild depletion  Clavicle and Acromion Bone Region  No depletion  Scapular Bone Region  No depletion  Dorsal Hand  No depletion  Patellar Region  No depletion  Anterior Thigh Region  No depletion  Posterior Calf Region  No depletion  Edema (RD Assessment)  None     Diet Order:   Diet Order            Diet regular Room service appropriate? Yes; Fluid consistency: Thin  Diet effective now              EDUCATION NEEDS:   Education needs have been addressed  Skin:  Skin Assessment: Reviewed RN Assessment  Last BM:  10/06/18  Height:   Ht Readings from Last 1 Encounters:  09/28/18 6\' 1"  (1.854 m)    Weight:   Wt Readings from Last 1 Encounters:  10/04/18 89.4 kg    Ideal Body Weight:  83.6 kg  BMI:  Body mass index is 26.02 kg/m.  Estimated Nutritional Needs:   Kcal:  2300-2500 kcal  Protein:  115-130 grams  Fluid:  >/= 2 L/day  Vanessa Kick RD, LDN Clinical Nutrition Pager # - [phone]

## 2018-10-07 NOTE — Progress Notes (Addendum)
Pharmacy Antibiotic Note  Justin Ball is a 27 y.o. 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 Pharmacy has been consulted for Ambisome and flucytosine dosing, as well as electrolyte monitoring. WBC WNL, currently AF. Scr down to 1.05 today, estimated CrCl > 100 mL/min. Of note patient had severe pain at the infusion site during his amphotericin B infusion last night after about half of the bag had been infused and infusion was stopped. K today is 3.2 after replacement with 60 mEQ yesterday and magensium 1.8 after replacement with 2 gm yesterday. Today is D#8/14.  Plan: Continue Ambisome 350mg  (~4 mg/kg) IV q24h and monitor pain Continue flucytosine 2000mg  (~25 mg/kg) PO q6h Schedule potassium 40 mEq BID today for replacement  Will monitor and adjust as necessary  Replace Mg with 2 gm X 1  Daily BMET and Mg while on Ambisome Monitor CBC on Flucytosine  Will continue to follow renal function and replace electrolytes as appropriate    Height: 6\' 1"  (185.4 cm) Weight: 197 lb 3.2 oz (89.4 kg) IBW/kg (Calculated) : 79.9  Temp (24hrs), Avg:98.3 F (36.8 C), Min:98.2 F (36.8 C), Max:98.4 F (36.9 C)  Recent Labs  Lab 10/02/18 0401 10/03/18 0408 10/04/18 0352 10/05/18 0412 10/06/18 0430 10/06/18 2246 10/07/18 0330  WBC 5.4 5.6 4.9 5.1 4.4  --   --   CREATININE 1.07 1.01 0.94 1.28* 1.10 1.08 1.05    Estimated Creatinine Clearance: 119.4 mL/min (by C-G formula based on SCr of 1.05 mg/dL).    Allergies  Allergen Reactions  . Acyclovir And Related Itching   Antimicrobials this admission: Ambisome 10/7 >>  Flucytosine 10/7 >>   Microbiology results: 10/8 CSF cx: NG x2  Thank you for allowing pharmacy to be a part of this patient's care.  Sharin Mons, PharmD, BCPS Infectious Diseases Clinical Pharmacist Phone: [phone] 10/07/2018 9:41 AM

## 2018-10-07 NOTE — Progress Notes (Signed)
Family Medicine Teaching Service Daily Progress Note Intern Pager: [phone]  Patient name: Justin Ball Medical record number: [id number] Date of birth: 06-19-1991 Age: 27 y.o. Gender: male  Primary Care Provider: Patient, No Pcp Per Consultants: Gastroenterology, Neurology, Infectious Disease Code Status: Full  Pt Overview and Major Events to Date:  10/05: Admit for malaise and jaundice found to have uncontrolled HIV and HCV 10/08: Received lumbar puncture, positive cryptococcus antigen and was started on antifungals by ID  Assessment and Plan: Justin R Coleis a 27 y.o.malepresenting with 2 week h/o malaise and jaundice. PMH is significant forchronic hepatitis B, HIV on ART, h/o cryptococcal meningoencephalitis, bipolar d/o.  Cryptococcal meningoencephalitis: Acute.  No signs of AMS or systemic infection. Positive for cryptococcal antigen level at 20. Continue antifungal treatment today. Pharmacy resumed electrolyte monitoring and replenishment today. - Continue amphotericin and flucytosine for completion of 14-day treatment (10/07-10/21) - f/u ID recs - Pharmacy managing potassium and magnesium levels while on antifungals, appreciate assistance  Uncontrolled HIV  Hepatitis C  Hepatitis B: Patient denies any more diarrhea. Jaundice is greatly improved since admission. Mental status is at baseline. Would appreciate ID recommendations for infection prophylaxis given CD4 count <100. GI recommends HCV treatment plan of repeat RNA VL at 6 weeks, 12 weeks, and 24 weeks time with abstinence from alcohol and illicit drugs - f/u ID recs - Resume Tivicay/Odefsey on 10/14 per ID - continue to monitor  NormocyticAnemia: Chronic. Stable without signs of active bleeding.  Likely secondary to anemia of chronic inflammation given uncontrolled HIV/HCV/HBV. - Continue monitoring for blood loss - CBC daily since hgb still continue to drop gradually every day  Bipolar disorder: Chronic.  Behavioral disturbances last night. Restarted Seroquel  - Continue monitoring mood and affect - continue seroquel daily  FEN/GI: Regular diet PPx: SCDs (patient not wearing)  Disposition: Pending completion of antifungals for cryptococcus   Subjective:  Overnight: behavioral issues per nursing. Patient denies. Restarted Seroquel   Today: patient states he is doing well and has no issues. He still would like to go outside for a few minutes. He would also like more soda and non-slip socks since he urinated on his old ones.  Objective: Temp:  [98.2 F (36.8 C)-98.4 F (36.9 C)] 98.3 F (36.8 C) (10/14 0741) Pulse Rate:  [85-97] 85 (10/14 0741) Resp:  [17-19] 17 (10/14 0741) BP: (85-116)/(49-79) 93/58 (10/14 0741) SpO2:  [98 %-100 %] 98 % (10/14 0741)   Physical Exam: General: well nourished, well developed, NAD with non-toxic appearance HEENT: normocephalic, atraumatic, moist mucous membranes, minor scleral icterus present (much improved since admission) Neck: supple, non-tender without lymphadenopathy Cardiovascular: regular rate and rhythm without murmurs, S3 present Lungs: clear to auscultation bilaterally with normal work of breathing Abdomen: soft, non-tender, distended Skin: warm, dry, diffuse small, healed scars on extremities and trunk Extremities: warm and well perfused, normal tone, no edema Neuro: alert and oriented. Unchanged mental status throughout admission.  Laboratory: Recent Labs  Lab 10/04/18 0352 10/05/18 0412 10/06/18 0430  WBC 4.9 5.1 4.4  HGB 9.5* 9.1* 8.8*  HCT 27.9* 27.0* 26.6*  PLT 150 160 168   Recent Labs  Lab 10/05/18 0412 10/06/18 0430 10/06/18 2246 10/07/18 0330  NA 138 136 133* 138  K 3.6 3.0* 3.0* 3.2*  CL 113* 114* 113* 116*  CO2 19* 17* 17* 19*  BUN 8 6 8 6   CREATININE 1.28* 1.10 1.08 1.05  CALCIUM 8.1* 8.0* 7.6* 8.0*  PROT 6.6 7.1  --  6.9  BILITOT 10.5* 9.9*  --  8.5*  ALKPHOS 148* 157*  --  145*  ALT 60* 63*  --  66*   AST 186* 191*  --  187*  GLUCOSE 75 92 90 81   CD4 T cell Abs: 90 CD4% helper T-cell: 8 HIV 1 RNA quant: 3460 HCV antibody: >11.0 HCV genotype: 1A HCV RNA quant: 6,610,000 HBsAg: Positive HBV DNA PCR: 810 QuantiFERON gold: Negative Cryptococcal antigen: Positive, antigen titers 640 CSF labs: Xanthochromatic, protein 65, glucose 39, no growth at 3 days, Direct bilirubin: 16.0 Ammonia: 72 Urinalysis: Unremarkable I-STAT lactic acid: 1.42 Folate: 221 (WNL) B12: 753 (WNL) Ferritin: 497 (H)  Imaging/Diagnostic Tests: EKG 12-Lead: NSR without ST changes or t-wave abnormalities or heart blocks, with QTc 435  CT HEAD WITHOUT CONTRAST (09/30/2018) 1. No evidence of acute intracranial abnormality. 2. Interval removal of ventriculostomy catheter with decreased distention of the frontal horns following resolution of pneumocephalus. Otherwise unchanged mild ventriculomegaly. 3. Areas of encephalomalacia in both cerebral and cerebellar hemispheres corresponding to insults on a 2018 brain MRI.  CT ABDOMEN AND PELVIS WITH CONTRAST (09/28/2018) 1. Mild circumferential wall thickening noted in the right colon. Imaging features suspicious for infectious/inflammatory colitis. 2. Otherwise unremarkable study.  Leeroy Bock, DO 10/07/2018, 8:03 AM PGY-1, [facility] FPTS Intern pager: [phone], text pages welcome

## 2018-10-08 LAB — BASIC METABOLIC PANEL
ANION GAP: 4 — AB (ref 5–15)
BUN: 7 mg/dL (ref 6–20)
CALCIUM: 8.3 mg/dL — AB (ref 8.9–10.3)
CO2: 21 mmol/L — ABNORMAL LOW (ref 22–32)
Chloride: 115 mmol/L — ABNORMAL HIGH (ref 98–111)
Creatinine, Ser: 1.21 mg/dL (ref 0.61–1.24)
Glucose, Bld: 90 mg/dL (ref 70–99)
Potassium: 3.3 mmol/L — ABNORMAL LOW (ref 3.5–5.1)
SODIUM: 140 mmol/L (ref 135–145)

## 2018-10-08 LAB — CBC
HCT: 25.8 % — ABNORMAL LOW (ref 39.0–52.0)
Hemoglobin: 8.5 g/dL — ABNORMAL LOW (ref 13.0–17.0)
MCH: 32.3 pg (ref 26.0–34.0)
MCHC: 32.9 g/dL (ref 30.0–36.0)
MCV: 98.1 fL (ref 80.0–100.0)
NRBC: 0 % (ref 0.0–0.2)
PLATELETS: 164 10*3/uL (ref 150–400)
RBC: 2.63 MIL/uL — AB (ref 4.22–5.81)
RDW: 17.2 % — AB (ref 11.5–15.5)
WBC: 4.4 10*3/uL (ref 4.0–10.5)

## 2018-10-08 LAB — MAGNESIUM: MAGNESIUM: 1.9 mg/dL (ref 1.7–2.4)

## 2018-10-08 MED ORDER — MAGNESIUM SULFATE 2 GM/50ML IV SOLN
2.0000 g | Freq: Once | INTRAVENOUS | Status: AC
  Administered 2018-10-08: 2 g via INTRAVENOUS
  Filled 2018-10-08: qty 50

## 2018-10-08 MED ORDER — POTASSIUM CHLORIDE 10 MEQ/100ML IV SOLN
10.0000 meq | INTRAVENOUS | Status: AC
  Administered 2018-10-08 (×4): 10 meq via INTRAVENOUS
  Filled 2018-10-08 (×4): qty 100

## 2018-10-08 MED ORDER — POTASSIUM CHLORIDE CRYS ER 20 MEQ PO TBCR
20.0000 meq | EXTENDED_RELEASE_TABLET | Freq: Once | ORAL | Status: DC
  Filled 2018-10-08: qty 1

## 2018-10-08 MED ORDER — LEVETIRACETAM IN NACL 1000 MG/100ML IV SOLN
1000.0000 mg | Freq: Two times a day (BID) | INTRAVENOUS | Status: DC
  Administered 2018-10-08 – 2018-10-14 (×12): 1000 mg via INTRAVENOUS
  Filled 2018-10-08 (×13): qty 100

## 2018-10-08 MED ORDER — PROCHLORPERAZINE EDISYLATE 10 MG/2ML IJ SOLN
10.0000 mg | Freq: Every day | INTRAMUSCULAR | Status: DC
  Administered 2018-10-09 – 2018-10-14 (×6): 10 mg via INTRAVENOUS
  Filled 2018-10-08 (×6): qty 2

## 2018-10-08 MED ORDER — PROCHLORPERAZINE EDISYLATE 10 MG/2ML IJ SOLN: 10.0000 mg | INTRAMUSCULAR | Status: DC | PRN

## 2018-10-08 NOTE — Progress Notes (Addendum)
Family Medicine Teaching Service Daily Progress Note Intern Pager: [phone]  Patient name: Justin Ball Medical record number: [id number] Date of birth: May 18, 1991 Age: 27 y.o. Gender: male  Primary Care Provider: Patient, No Pcp Per Consultants: Gastroenterology, Neurology, Infectious Disease Code Status: Full  Pt Overview and Major Events to Date:  10/05: Admit for malaise and jaundice found to have uncontrolled HIV and HCV 10/08: Received lumbar puncture, positive cryptococcus antigen and was started on antifungals by ID  Assessment and Plan: Justin R Coleis a 27 y.o.malepresenting with 2 week h/o malaise and jaundice. PMH is significant forchronic hepatitis B, HIV on ART, h/o cryptococcal meningoencephalitis, bipolar d/o.  Cryptococcal meningoencephalitis: Acute. No signs of AMS or systemic infection. Positive for cryptococcal antigen level at 20 on admission. Continue antifungal treatment today. Pharmacy managing electrolyte mointoring and replenishment. - ID recs appreciated - Continue amphotericin and flucytosine for completion of 14-day treatment (10/07-10/21) - Pharmacy managing potassium and magnesium levels while on antifungals, appreciate assistance  Uncontrolled HIV  Hepatitis C  Hepatitis B: Patient denies any nausea or diarrhea. Jaundice is greatly improved since admission. Mental status is at baseline. GI recommends HCV treatment plan of repeat RNA VL at 6 weeks, 12 weeks, and 24 weeks time with abstinence from alcohol and illicit drugs. ART (Tivicay and Odefsey restarted 10/14) - f/u ID recs - continue Tivicay/Odefsey - monitor for signs/symptoms of IRIS - monitor fever curve  NormocyticAnemia: Chronic. Stable without signs of active bleeding.  Likely secondary to anemia of chronic inflammation given uncontrolled HIV/HCV/HBV. Hgb 8.5 today (12.2 on admission 10/5) - Continue monitoring for blood loss - CBC daily since hgb still continue to drop gradually every  day  Bipolar disorder: Chronic. - Continue monitoring mood and affect - continue seroquel daily  FEN/GI: Regular diet PPx: SCDs (patient not wearing)  Disposition: Pending completion of antifungals for cryptococcus   Subjective:  Overnight: no acute events  Today: patient is sleeping on arrival to room and continues to lay with eyes closed for exam. Is responsive to voice. He does answer questions appropriately. He denies any nausea, diarrhea, pain. Will return to interview later in day when is awake more.   Objective: Temp:  [98.5 F (36.9 C)] 98.5 F (36.9 C) (10/14 2117) Pulse Rate:  [91] 91 (10/15 0429) Resp:  [14-16] 16 (10/15 0429) BP: (111-114)/(70-75) 114/70 (10/15 0429) SpO2:  [99 %-100 %] 99 % (10/15 0429) Weight:  [86.6 kg] 86.6 kg (10/14 2116)   Physical Exam: General: well nourished, well developed, NAD with non-toxic appearance HEENT: normocephalic, atraumatic, moist mucous membranes Cardiovascular: regular rate and rhythm without murmurs, S3 present Lungs: clear to auscultation bilaterally with normal work of breathing Abdomen: soft, non-tender, distended Skin: warm, dry, diffuse small, healed scars on extremities and trunk Extremities: warm and well perfused, normal tone, no edema Neuro: semi alert to voice and oriented, appropriate alertness for waking up- will recheck later to make sure is not change in baseline daytime alertness. Psych: cooperative  Laboratory: Recent Labs  Lab 10/05/18 0412 10/06/18 0430 10/08/18 0308  WBC 5.1 4.4 4.4  HGB 9.1* 8.8* 8.5*  HCT 27.0* 26.6* 25.8*  PLT 160 168 164   Recent Labs  Lab 10/05/18 0412 10/06/18 0430 10/06/18 2246 10/07/18 0330 10/08/18 0308  NA 138 136 133* 138 140  K 3.6 3.0* 3.0* 3.2* 3.3*  CL 113* 114* 113* 116* 115*  CO2 19* 17* 17* 19* 21*  BUN 8 6 8 6 7   CREATININE 1.28* 1.10 1.08 1.05  1.21  CALCIUM 8.1* 8.0* 7.6* 8.0* 8.3*  PROT 6.6 7.1  --  6.9  --   BILITOT 10.5* 9.9*  --  8.5*  --    ALKPHOS 148* 157*  --  145*  --   ALT 60* 63*  --  66*  --   AST 186* 191*  --  187*  --   GLUCOSE 75 92 90 81 90   CD4 T cell Abs: 90 CD4% helper T-cell: 8 HIV 1 RNA quant: 3460 HCV antibody: >11.0 HCV genotype: 1A HCV RNA quant: 6,610,000 HBsAg: Positive HBV DNA PCR: 810 QuantiFERON gold: Negative Cryptococcal antigen: Positive, antigen titers 640 CSF labs: Xanthochromatic, protein 65, glucose 39, no growth at 3 days, Direct bilirubin: 16.0 Ammonia: 72 Urinalysis: Unremarkable I-STAT lactic acid: 1.42 Folate: 221 (WNL) B12: 753 (WNL) Ferritin: 497 (H) Magnesium: 1.9  Imaging/Diagnostic Tests: EKG 12-Lead: NSR without ST changes or t-wave abnormalities or heart blocks, with QTc 435  CT HEAD WITHOUT CONTRAST (09/30/2018) 1. No evidence of acute intracranial abnormality. 2. Interval removal of ventriculostomy catheter with decreased distention of the frontal horns following resolution of pneumocephalus. Otherwise unchanged mild ventriculomegaly. 3. Areas of encephalomalacia in both cerebral and cerebellar hemispheres corresponding to insults on a 2018 brain MRI.  CT ABDOMEN AND PELVIS WITH CONTRAST (09/28/2018) 1. Mild circumferential wall thickening noted in the right colon. Imaging features suspicious for infectious/inflammatory colitis. 2. Otherwise unremarkable study.  Leeroy Bock, DO 10/08/2018, 8:59 AM PGY-1, [facility] FPTS Intern pager: [phone], text pages welcome

## 2018-10-08 NOTE — Progress Notes (Deleted)
Medications given for this evening. Waiting for [facility] for transport.

## 2018-10-08 NOTE — Progress Notes (Signed)
FPTS Interim Progress Note  S:patient vomited morning medications including potassium, keppra, lactulose, and flucytosine.   O: BP 110/80 (BP Location: Left Arm)   Pulse 88   Temp 98.3 F (36.8 C) (Oral)   Resp 18   Ht 6\' 1"  (1.854 m)   Wt 86.6 kg   SpO2 100%   BMI 25.20 kg/m     A/P:  Acute nausea/vomiting- as this is a possible side effect of restarting ART yesterday (IRIS) will continue to monitor patient very closely. Additionally:  - compazine scheduled for morning medication dose time - compazine PRN for nausea/vomiting - monitor vitals - replenish potassium via IV instead of PO- I have made this distinction in the past because the patient has difficulties with swallowing the pills and gets "sick to the stomach" so recommend if pharmacy is going to replenish K that use IV form especially in setting of vomiting - Changed Keppra to IV form - administer lactulose at next scheduled dose - administer flucytosine at next scheduled dose   Leeroy Bock, DO 10/08/2018, 12:27 PM PGY-1, [facility] Family Medicine Service pager [phone]

## 2018-10-08 NOTE — Progress Notes (Signed)
Notified family teaching via amion that pt had vomited daily meds and K+ pills. Asked if pt would need to take daily meds again, awaiting response.   Leonia Reeves, RN

## 2018-10-08 NOTE — Progress Notes (Signed)
Pharmacy Antibiotic Note  Justin Ball is a 27 y.o. 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 Pharmacy has been consulted for Ambisome and flucytosine dosing, as well as electrolyte monitoring. WBC WNL, currently AF. Scr 1.05>>1.21, estimated CrCl ~ 100 mL/min. K today is 3.3 after replacement with 80 mEQ yesterday and magensium 1.9 after replacement with 2 gm yesterday. Today is D#9/14.  Plan: Continue Ambisome 350mg  (~4 mg/kg) IV q24h and monitor pain Continue flucytosine 2000mg  (~25 mg/kg) PO q6h Potassium 40 mEq BID for replacement maintenance  Will add an additional 20 mEq this AM for replacement Daily BMET and Mg while on Ambisome Monitor CBC on Flucytosine  Will continue to follow renal function and replace electrolytes as appropriate Will watch SCr and increase fluid boluses as necessary   Height: 6\' 1"  (185.4 cm) Weight: 191 lb (86.6 kg) IBW/kg (Calculated) : 79.9  Temp (24hrs), Avg:98.5 F (36.9 C), Min:98.5 F (36.9 C), Max:98.5 F (36.9 C)  Recent Labs  Lab 10/03/18 0408 10/04/18 0352 10/05/18 0412 10/06/18 0430 10/06/18 2246 10/07/18 0330 10/08/18 0308  WBC 5.6 4.9 5.1 4.4  --   --  4.4  CREATININE 1.01 0.94 1.28* 1.10 1.08 1.05 1.21    Estimated Creatinine Clearance: 103.6 mL/min (by C-G formula based on SCr of 1.21 mg/dL).    Allergies  Allergen Reactions  . Acyclovir And Related Itching   Antimicrobials this admission: Ambisome 10/7 >>  Flucytosine 10/7 >>   Microbiology results: 10/8 CSF cx: NG x2  Thank you for allowing pharmacy to be a part of this patient's care.  Sharin Mons, PharmD, BCPS Infectious Diseases Clinical Pharmacist Phone: [phone] 10/08/2018 8:38 AM

## 2018-10-08 NOTE — Discharge Summary (Signed)
Family Medicine Teaching [facility] Discharge Summary  Patient name: Justin Ball Medical record number: [id number] Date of birth: April 23, 1991 Age: 27 y.o. Gender: male Date of Admission: 09/28/2018  Date of Discharge: 10/14/2018 Admitting Physician: Ellwood Dense, DO  Primary Care Provider: Patient, No Pcp Per Consultants: GI, ID  Indication for Hospitalization: jaundice  Discharge Diagnoses/Problem List:  Cryptococcal meningitis Hepatitis C Hepatitis B AIDS Colitis AKI Normocytic anemia Bipolar disorder  Disposition: discharge home with outpatient follow up for continued treatment of Hep B and AIDs  Discharge Condition: improved  Discharge Exam:  General: well appearing Heart: RRR, S3 present Pulm: CTAB, no wheezing Abdomen: distended, soft, non-tender Derm: small healed scars diffusely on limbs and trunk, no rashes Neuro: alert and oriented, no focal deficits Psych: flat affect  Brief Hospital Course:  Patient was admitted for jaundice. He was found to have active cryptococcus infection, acute Hep C and AIDs. He was treated inpatient with IV amphotericin and flucytosine. ART was held until second week of treatment. Patient had stable vital signs throughout admission and remained inpatient for treatment duration. His jaundice resolved. Patient will require follow up with ID and GI for further outpatient treatment.   Issues for Follow Up:  1. Will need follow up for management of Hep C (genotype 1a, quant 6.82 on admission) 2. Will need follow up with ID for maintenance treatment of AIDs (CD4 8 on 10/5) and cryptococcal infection (Ag titer 20 09/2018) on long term maintenance fluconazole. Also prescribed oi PPx bactrim.  Significant Procedures: LP 10/8  Significant Labs and Imaging:  Recent Labs  Lab 10/05/18 0412 10/06/18 0430 10/08/18 0308  WBC 5.1 4.4 4.4  HGB 9.1* 8.8* 8.5*  HCT 27.0* 26.6* 25.8*  PLT 160 168 164   Recent Labs  Lab 10/02/18 0401  10/03/18 0408 10/04/18 0352 10/05/18 0412 10/06/18 0430 10/06/18 2246 10/07/18 0330 10/08/18 0308  NA 134* 134* 135 138 136 133* 138 140  K 3.5 3.2* 3.6 3.6 3.0* 3.0* 3.2* 3.3*  CL 109 110 112* 113* 114* 113* 116* 115*  CO2 20* 19* 20* 19* 17* 17* 19* 21*  GLUCOSE 82 85 75 75 92 90 81 90  BUN 7 6 5* 8 6 8 6 7   CREATININE 1.07 1.01 0.94 1.28* 1.10 1.08 1.05 1.21  CALCIUM 7.8* 7.8* 7.8* 8.1* 8.0* 7.6* 8.0* 8.3*  MG 1.7 1.8 2.0 1.7 1.6*  --  1.8 1.9  ALKPHOS 130* 130*  --  148* 157*  --  145*  --   AST 173* 188*  --  186* 191*  --  187*  --   ALT 56* 61*  --  60* 63*  --  66*  --   ALBUMIN 1.4* 1.4*  --  1.5* 1.5*  --  1.6*  --     Results/Tests Pending at Time of Discharge: none  Discharge Medications:  Allergies as of 10/14/2018      Reactions   Acyclovir And Related Itching      Medication List    TAKE these medications   acetaminophen 325 MG tablet Commonly known as:  TYLENOL Take 2 tablets (650 mg total) by mouth every 4 (four) hours as needed for mild pain (temp > 101.5).   dolutegravir 50 MG tablet Commonly known as:  TIVICAY Take 1 tablet (50 mg total) by mouth daily.   emtricitabine-rilpivir-tenofovir AF 200-25-25 MG Tabs tablet Commonly known as:  ODEFSEY Take 1 tablet by mouth daily with breakfast.   fluconazole 200 MG tablet  Commonly known as:  DIFLUCAN Take 2 tablets (400 mg total) by mouth daily at 6 PM. What changed:    how much to take  when to take this   levETIRAcetam 1000 MG tablet Commonly known as:  KEPPRA Take 1 tablet (1,000 mg total) by mouth 2 (two) times daily.   QUEtiapine 25 MG tablet Commonly known as:  SEROQUEL Take 1 tablet (25 mg total) by mouth 2 (two) times daily as needed (agitation).   sulfamethoxazole-trimethoprim 800-160 MG tablet Commonly known as:  BACTRIM DS,SEPTRA DS Take 1 tablet by mouth daily.       Discharge Instructions: Please refer to Patient Instructions section of EMR for full details.  Patient was  counseled important signs and symptoms that should prompt return to medical care, changes in medications, dietary instructions, activity restrictions, and follow up appointments.   Follow-Up Appointments: Follow-up Information    [facility] - Dme Follow up.   Why:  They will assist you with your home antibiotics Contact information: [address] [name] [address] [phone]          1. 10/29 [facility] for 6 month f/u  Leeroy Bock, DO 10/08/2018, 2:56 PM PGY-1, [facility]

## 2018-10-09 DIAGNOSIS — K529 Noninfective gastroenteritis and colitis, unspecified: Secondary | ICD-10-CM

## 2018-10-09 DIAGNOSIS — R112 Nausea with vomiting, unspecified: Secondary | ICD-10-CM

## 2018-10-09 DIAGNOSIS — R1110 Vomiting, unspecified: Secondary | ICD-10-CM

## 2018-10-09 LAB — BASIC METABOLIC PANEL
ANION GAP: 4 — AB (ref 5–15)
BUN: 10 mg/dL (ref 6–20)
CALCIUM: 8.4 mg/dL — AB (ref 8.9–10.3)
CO2: 19 mmol/L — AB (ref 22–32)
CREATININE: 1.11 mg/dL (ref 0.61–1.24)
Chloride: 115 mmol/L — ABNORMAL HIGH (ref 98–111)
GLUCOSE: 86 mg/dL (ref 70–99)
Potassium: 3.4 mmol/L — ABNORMAL LOW (ref 3.5–5.1)
Sodium: 138 mmol/L (ref 135–145)

## 2018-10-09 LAB — CBC
HEMATOCRIT: 26.2 % — AB (ref 39.0–52.0)
Hemoglobin: 8.6 g/dL — ABNORMAL LOW (ref 13.0–17.0)
MCH: 31.7 pg (ref 26.0–34.0)
MCHC: 32.8 g/dL (ref 30.0–36.0)
MCV: 96.7 fL (ref 80.0–100.0)
NRBC: 0 % (ref 0.0–0.2)
PLATELETS: 170 10*3/uL (ref 150–400)
RBC: 2.71 MIL/uL — ABNORMAL LOW (ref 4.22–5.81)
RDW: 16.4 % — ABNORMAL HIGH (ref 11.5–15.5)
WBC: 4.6 10*3/uL (ref 4.0–10.5)

## 2018-10-09 LAB — MAGNESIUM: Magnesium: 1.8 mg/dL (ref 1.7–2.4)

## 2018-10-09 MED ORDER — MAGNESIUM SULFATE 2 GM/50ML IV SOLN
2.0000 g | Freq: Once | INTRAVENOUS | Status: AC
  Administered 2018-10-09: 2 g via INTRAVENOUS
  Filled 2018-10-09: qty 50

## 2018-10-09 MED ORDER — POTASSIUM CHLORIDE CRYS ER 10 MEQ PO TBCR
40.0000 meq | EXTENDED_RELEASE_TABLET | Freq: Once | ORAL | Status: AC
  Administered 2018-10-09: 40 meq via ORAL
  Filled 2018-10-09: qty 4

## 2018-10-09 NOTE — Progress Notes (Signed)
Pharmacy Antibiotic Note  Justin Ball is a 27 y.o. 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 Pharmacy has been consulted for Ambisome and flucytosine dosing, as well as electrolyte monitoring. WBC WNL, currently AF. Scr 1.21>>1.11, estimated CrCl ~ 110 mL/min. K today is 3.4 after replacement with 120 mEQ yesterday and magensium 1.8 after replacement with 2 gm yesterday. Today is D#10/14.  Plan: Continue Ambisome 350mg  (~4 mg/kg) IV q24h and monitor pain Continue flucytosine 2000mg  (~25 mg/kg) PO q6h Potassium 40 mEq BID for replacement maintenance  Will add an additional 40 mEq this AM for replacement Switched potassium formulation from one 40 mEq potassium tablet to four of the 10 mEq tablets yesterday. Clarified with RN this morning - patient was able to tolerate taking the 10 mEq potassium tablets last night. Will continue utilizing this formulation for now. Will give magnesium 2 g IV this AM for replacement Daily BMET and Mg while on Ambisome Monitor CBC on Flucytosine  Will continue to follow renal function and replace electrolytes as appropriate Will watch SCr and increase fluid boluses as necessary  Height: 6\' 1"  (185.4 cm) Weight: 191 lb (86.6 kg) IBW/kg (Calculated) : 79.9  Temp (24hrs), Avg:98.5 F (36.9 C), Min:98.3 F (36.8 C), Max:98.9 F (37.2 C)  Recent Labs  Lab 10/04/18 0352 10/05/18 0412 10/06/18 0430 10/06/18 2246 10/07/18 0330 10/08/18 0308 10/09/18 0509  WBC 4.9 5.1 4.4  --   --  4.4 4.6  CREATININE 0.94 1.28* 1.10 1.08 1.05 1.21 1.11    Estimated Creatinine Clearance: 113 mL/min (by C-G formula based on SCr of 1.11 mg/dL).    Allergies  Allergen Reactions  . Acyclovir And Related Itching   Antimicrobials this admission: Ambisome 10/7 >>  Flucytosine 10/7 >>   Microbiology results: 10/8 CSF cx: NG x2  Thank you for allowing pharmacy to be a part of this patient's care.  Danae Orleans, PharmD PGY1 Pharmacy  Resident Phone [phone] 10/09/2018       9:12 AM

## 2018-10-09 NOTE — Progress Notes (Signed)
[facility]    Date of Admission:  09/28/2018   Total days of antibiotics 10        Day 10 of ampho plus flucytosine        Day 3 tivicay/descovy  ID: Justin Ball is a 27 y.o. 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 Chlorhexidine Gluconate Cloth  6 each Topical Q0600  . dextrose  10 mL Intravenous Q24H  . dextrose  10 mL Intravenous Q24H  . dolutegravir  50 mg Oral Daily  . emtricitabine-rilpivir-tenofovir AF  1 tablet Oral Q breakfast  . feeding supplement (ENSURE ENLIVE)  237 mL Oral BID BM  . flucytosine  25 mg/kg Oral Q6H  . lactulose  10 g Oral Daily  . LORazepam  0.5 mg Oral Once  . nicotine  7 mg Transdermal Daily  . potassium chloride  40 mEq Oral BID  . prochlorperazine  10 mg Intravenous Daily  . QUEtiapine  25 mg Oral QHS  . sodium chloride  500 mL Intravenous Q24H  . sodium chloride flush  10-40 mL Intracatheter Q12H    Objective: Vital signs in last 24 hours: Temp:  [98 F (36.7 C)-98.9 F (37.2 C)] 98 F (36.7 C) (10/16 1109) Pulse Rate:  [82-97] 82 (10/16 1109) Resp:  [15-19] 18 (10/16 1109) BP: (106-118)/(70-74) 106/70 (10/16 1109) SpO2:  [99 %-100 %] 99 % (10/16 1109) Weight:  [86.6 kg] 86.6 kg (10/15 2001) Physical Exam  Constitutional: He is oriented to person, place, and time. He appears well-developed  and well-nourished. No distress.  HENT:  Mouth/Throat: Oropharynx is clear and moist. No oropharyngeal exudate.  Cardiovascular: Normal rate, regular rhythm and normal heart sounds. Exam reveals no gallop and no friction rub.  No murmur heard.  Pulmonary/Chest: Effort normal and breath sounds normal. No respiratory distress. He has no wheezes.  Abdominal: Soft. Bowel sounds are normal. He exhibits no distension. There is no tenderness.  Lymphadenopathy:  He has no cervical adenopathy.  Neurological: He is alert and oriented to person, place, and time.  Skin: Skin is warm and dry. No rash noted. No erythema.  Psychiatric: He has a normal mood and affect. His behavior is normal.     Lab Results Recent Labs    10/08/18 0308 10/09/18 0509  WBC 4.4 4.6  HGB 8.5* 8.6*  HCT 25.8* 26.2*  NA 140 138  K 3.3* 3.4*  CL 115* 115*  CO2 21* 19*  BUN 7 10  CREATININE 1.21 1.11   Liver Panel Recent Labs    10/07/18 0330  PROT 6.9  ALBUMIN 1.6*  AST 187*  ALT 66*  ALKPHOS 145*  BILITOT 8.5*    Microbiology: CSF- cx ngtd Studies/Results: No results found.   Assessment/Plan: hiv disease= currently restarted on ART prior to finishing  reinduction per dr hatcher. Appears to tolerate. If having worsening headache will stop his ART. He reports only stopping his ART roughly 3 weeks ago  CM = he was supposed to be on maintenance phase of CM treatment but has been reinduced. Currently on day 10 of 14. Electrolyte repletion being monitored by pharmacy. If he has headache, recommend repeat LP. No growth on culture  Acute hep C = transaminitis improving as well as hyperbilirubinemia. Recommend INR check.  Chronic hep B = had low level viremia, back on descovy which would treat his hepatitis.  oi proph = continue on bactrim   [facility] Cell: [phone] Pager: [phone]  10/09/2018, 4:30 PM

## 2018-10-09 NOTE — Progress Notes (Addendum)
Family Medicine Teaching Service Daily Progress Note Intern Pager: [phone]  Patient name: Justin Ball Medical record number: [id number] Date of birth: November 03, 1991 Age: 27 y.o. Gender: male  Primary Care Provider: Patient, No Pcp Per Consultants: Gastroenterology, Neurology, Infectious Disease Code Status: Full  Pt Overview and Major Events to Date:  10/05: Admit for malaise and jaundice found to have uncontrolled HIV and HCV 10/08: Received lumbar puncture, positive cryptococcus antigen and was started on antifungals by ID  Assessment and Plan: Justin R Coleis a 27 y.o.malepresenting with 2 week h/o malaise and jaundice. PMH is significant forchronic hepatitis B, HIV on ART, h/o cryptococcal meningoencephalitis, bipolar d/o.  Cryptococcal meningoencephalitis: Acute. No signs of AMS or systemic infection. Positive for cryptococcal antigen level at 20 on admission. Continue antifungal treatment today. Pharmacy managing electrolyte mointoring and replenishment. Magnesium 1.8, Potassium 3.4 today. - ID recs appreciated - Continue amphotericin and flucytosine for completion of 14-day treatment (10/07-10/21) - Pharmacy managing potassium and magnesium levels while on antifungals, appreciate assistance  Hypokalemia- pharmacy monitoring and replenishing scheduled K+. Potassium today 3.4 - continue Potassium monitoring and replenishment with IV or tablets - daily BMP  Uncontrolled HIV  Hepatitis C  Hepatitis B: Patient denies any nausea or diarrhea. Jaundice is greatly improved since admission. Mental status is at baseline. GI recommends HCV treatment plan of repeat RNA VL at 6 weeks, 12 weeks, and 24 weeks time with abstinence from alcohol and illicit drugs. ART (Tivicay and Odefsey restarted 10/14) - f/u ID recs - continue Tivicay/Odefsey - monitor for signs/symptoms of IRIS - monitor fever curve - consult CSW for dc planning with medication to help ensure  compliance  NormocyticAnemia: Chronic. Stable without signs of active bleeding.  Likely secondary to anemia of chronic inflammation given uncontrolled HIV/HCV/HBV. Hgb 8.6 today (12.2 on admission 10/5) - Continue monitoring for blood loss - CBC daily since hgb still continue to drop  Qtc prolongation - avoid Qtc prolonging medications  Bipolar disorder: Chronic. - Continue monitoring mood and affect - continue seroquel daily - continue Keppra IV  FEN/GI: Regular diet PPx: SCDs (patient not wearing)  Disposition: Pending completion of antifungals for cryptococcus   Subjective:  Overnight: patient had nausea/vomiting yesterday morning. Added on compazine scheduled for med administration and PRN for nausea. Recommended pharmacy use IV potassium vs oral. Changed Keppra to IV. Attempt to administer medications again today and monitor.   Today: patient is sleeping on arrival to room and continues to lay with eyes closed for exam. Is responsive to voice. He does answer questions appropriately. He denies any nausea, diarrhea, pain. Will return to interview later in day when is awake more.   Objective: Temp:  [98.3 F (36.8 C)-98.9 F (37.2 C)] 98.9 F (37.2 C) (10/15 2001) Pulse Rate:  [86-97] 97 (10/15 2001) Resp:  [15-19] 15 (10/15 2001) BP: (110-118)/(72-80) 118/74 (10/15 2001) SpO2:  [100 %] 100 % (10/15 2001) Weight:  [86.6 kg] 86.6 kg (10/15 2001)   Physical Exam: General: well nourished, well developed, sleeping, NAD with non-toxic appearance HEENT: normocephalic, atraumatic, moist mucous membranes Cardiovascular: regular rate and rhythm without murmurs, S3 present Lungs: clear to auscultation bilaterally with normal work of breathing Abdomen: soft, non-tender, distended Skin: warm, dry, diffuse small, healed scars on extremities and trunk Extremities: warm and well perfused, normal tone, no edema Neuro: alert to voice and oriented, appropriate alertness for waking up-  will recheck later to make sure is not change in baseline daytime alertness. Psych: cooperative  Laboratory:  Recent Labs  Lab 10/06/18 0430 10/08/18 0308 10/09/18 0509  WBC 4.4 4.4 4.6  HGB 8.8* 8.5* 8.6*  HCT 26.6* 25.8* 26.2*  PLT 168 164 170   Recent Labs  Lab 10/05/18 0412 10/06/18 0430  10/07/18 0330 10/08/18 0308 10/09/18 0509  NA 138 136   < > 138 140 138  K 3.6 3.0*   < > 3.2* 3.3* 3.4*  CL 113* 114*   < > 116* 115* 115*  CO2 19* 17*   < > 19* 21* 19*  BUN 8 6   < > 6 7 10   CREATININE 1.28* 1.10   < > 1.05 1.21 1.11  CALCIUM 8.1* 8.0*   < > 8.0* 8.3* 8.4*  PROT 6.6 7.1  --  6.9  --   --   BILITOT 10.5* 9.9*  --  8.5*  --   --   ALKPHOS 148* 157*  --  145*  --   --   ALT 60* 63*  --  66*  --   --   AST 186* 191*  --  187*  --   --   GLUCOSE 75 92   < > 81 90 86   < > = values in this interval not displayed.   CD4 T cell Abs: 90 CD4% helper T-cell: 8 HIV 1 RNA quant: 3460 HCV antibody: >11.0 HCV genotype: 1A HCV RNA quant: 6,610,000 HBsAg: Positive HBV DNA PCR: 810 QuantiFERON gold: Negative Cryptococcal antigen: Positive, antigen titers 640 CSF labs: Xanthochromatic, protein 65, glucose 39, no growth at 3 days, Direct bilirubin: 16.0 Ammonia: 72 Urinalysis: Unremarkable I-STAT lactic acid: 1.42 Folate: 221 (WNL) B12: 753 (WNL) Ferritin: 497 (H) Magnesium: 1.9  Imaging/Diagnostic Tests: EKG 12-Lead: NSR without ST changes or t-wave abnormalities or heart blocks, with QTc 435  CT HEAD WITHOUT CONTRAST (09/30/2018) 1. No evidence of acute intracranial abnormality. 2. Interval removal of ventriculostomy catheter with decreased distention of the frontal horns following resolution of pneumocephalus. Otherwise unchanged mild ventriculomegaly. 3. Areas of encephalomalacia in both cerebral and cerebellar hemispheres corresponding to insults on a 2018 brain MRI.  CT ABDOMEN AND PELVIS WITH CONTRAST (09/28/2018) 1. Mild circumferential wall thickening  noted in the right colon. Imaging features suspicious for infectious/inflammatory colitis. 2. Otherwise unremarkable study.  Leeroy Bock, DO 10/09/2018, 9:34 AM PGY-1, [place] [facility] FPTS Intern pager: [phone], text pages welcome

## 2018-10-10 LAB — CBC
HCT: 25.5 % — ABNORMAL LOW (ref 39.0–52.0)
Hemoglobin: 8.8 g/dL — ABNORMAL LOW (ref 13.0–17.0)
MCH: 33.3 pg (ref 26.0–34.0)
MCHC: 34.5 g/dL (ref 30.0–36.0)
MCV: 96.6 fL (ref 80.0–100.0)
NRBC: 0 % (ref 0.0–0.2)
Platelets: 174 10*3/uL (ref 150–400)
RBC: 2.64 MIL/uL — AB (ref 4.22–5.81)
RDW: 16.2 % — AB (ref 11.5–15.5)
WBC: 5.2 10*3/uL (ref 4.0–10.5)

## 2018-10-10 LAB — PROTIME-INR
INR: 1.31
PROTHROMBIN TIME: 16.1 s — AB (ref 11.4–15.2)

## 2018-10-10 LAB — MAGNESIUM: MAGNESIUM: 1.7 mg/dL (ref 1.7–2.4)

## 2018-10-10 LAB — BASIC METABOLIC PANEL
ANION GAP: 5 (ref 5–15)
BUN: 8 mg/dL (ref 6–20)
CALCIUM: 8.5 mg/dL — AB (ref 8.9–10.3)
CO2: 20 mmol/L — ABNORMAL LOW (ref 22–32)
Chloride: 113 mmol/L — ABNORMAL HIGH (ref 98–111)
Creatinine, Ser: 1.1 mg/dL (ref 0.61–1.24)
GFR calc non Af Amer: >60 mL/min (ref >60)
GLUCOSE: 92 mg/dL (ref 70–99)
POTASSIUM: 3.6 mmol/L (ref 3.5–5.1)
SODIUM: 138 mmol/L (ref 135–145)

## 2018-10-10 MED ORDER — SULFAMETHOXAZOLE-TRIMETHOPRIM 800-160 MG PO TABS
1.0000 | ORAL_TABLET | Freq: Every day | ORAL | Status: DC
  Administered 2018-10-10 – 2018-10-14 (×5): 1 via ORAL
  Filled 2018-10-10 (×5): qty 1

## 2018-10-10 MED ORDER — NICOTINE 14 MG/24HR TD PT24
14.0000 mg | MEDICATED_PATCH | Freq: Every day | TRANSDERMAL | Status: AC
  Administered 2018-10-10 – 2018-10-13 (×4): 14 mg via TRANSDERMAL
  Filled 2018-10-10 (×4): qty 1

## 2018-10-10 MED ORDER — MAGNESIUM SULFATE 2 GM/50ML IV SOLN
2.0000 g | Freq: Once | INTRAVENOUS | Status: DC
  Filled 2018-10-10: qty 50

## 2018-10-10 MED ORDER — MAGNESIUM SULFATE 50 % IJ SOLN
3.0000 g | Freq: Once | INTRAVENOUS | Status: AC
  Administered 2018-10-10: 3 g via INTRAVENOUS
  Filled 2018-10-10: qty 6

## 2018-10-10 NOTE — Progress Notes (Addendum)
Pharmacy Antibiotic Note  Justin Ball is a 27 y.o. 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 Pharmacy has been consulted for Ambisome and flucytosine dosing, as well as electrolyte monitoring. WBC WNL, currently AF. Scr 1.1, estimated CrCl ~ 110 mL/min. K today is 3.6 after replacement with 120 mEQ yesterday and magensium 1.7 after replacement with 2 gm yesterday. Today is D#11/14.  Plan: Continue Ambisome 350mg  (~4 mg/kg) IV q24h and monitor pain Continue flucytosine 2000mg  (~25 mg/kg) PO q6h Potassium 40 mEq BID for replacement maintenance  Switched potassium formulation from one 40 mEq potassium tablet to four of the 10 mEq tablets on 10/15. Will continue to utilize this formulation for now. Will give magnesium 3 g IV this AM for replacement Daily BMET and Mg while on Ambisome Monitor CBC on Flucytosine  Will continue to follow renal function and replace electrolytes as appropriate Will watch SCr and increase fluid boluses as necessary  Height: 6\' 1"  (185.4 cm) Weight: 191 lb (86.6 kg) IBW/kg (Calculated) : 79.9  Temp (24hrs), Avg:98.1 F (36.7 C), Min:98 F (36.7 C), Max:98.2 F (36.8 C)  Recent Labs  Lab 10/05/18 0412 10/06/18 0430 10/06/18 2246 10/07/18 0330 10/08/18 0308 10/09/18 0509 10/10/18 0257  WBC 5.1 4.4  --   --  4.4 4.6 5.2  CREATININE 1.28* 1.10 1.08 1.05 1.21 1.11 1.10    Estimated Creatinine Clearance: 114 mL/min (by C-G formula based on SCr of 1.1 mg/dL).    Allergies  Allergen Reactions  . Acyclovir And Related Itching   Antimicrobials this admission: Ambisome 10/7 >>  Flucytosine 10/7 >>   Microbiology results: 10/8 CSF cx: NG x2  Thank you for allowing pharmacy to be a part of this patient's care.  Danae Orleans, PharmD PGY1 Pharmacy Resident Phone [phone] 10/10/2018       8:14 AM

## 2018-10-10 NOTE — Progress Notes (Signed)
Family Medicine Teaching Service Daily Progress Note Intern Pager: [phone]  Patient name: Justin Ball Medical record number: [id number] Date of birth: 23-Jun-1991 Age: 27 y.o. Gender: male  Primary Care Provider: Patient, No Pcp Per Consultants: Gastroenterology, Neurology, Infectious Disease Code Status: Full  Pt Overview and Major Events to Date:  10/05: Admit for malaise and jaundice found to have uncontrolled HIV and HCV 10/08: Received lumbar puncture, positive cryptococcus antigen and was started on antifungals by ID  Assessment and Plan: Justin R Coleis a 27 y.o.malepresenting with 2 week h/o malaise and jaundice. PMH is significant forchronic hepatitis B, HIV on ART, h/o cryptococcal meningoencephalitis, bipolar d/o.  Cryptococcal meningoencephalitis: Acute. No signs of AMS or systemic infection. No growth on culture. Continue antifungal treatment today (day 11/14). - ID recs appreciated - Continue amphotericin and flucytosine for completion of 14-day treatment (10/07-10/21) - repeat LP if develops worsening headache  Hypokalemia/hypomagnesium - pharmacy monitoring and replenishing scheduled K+ and magnesium. Potassium today 3.6, Magnesium 1.7 - daily BMP - daily magnesium - replenish K+ with IV or tablets  Uncontrolled HIV  Hepatitis C  Hepatitis B: Patient denies any nausea or diarrhea. Pt denies any headache or pains. Jaundice is greatly improved since admission. Mental status is at baseline. GI recommends HCV treatment plan of repeat RNA VL at 6 weeks, 12 weeks, and 24 weeks time with abstinence from alcohol and illicit drugs. ART (Tivicay and Odefsey restarted 10/14). ID note says "continue on bactrim" for opportunistic infection prophylaxis but they have never initiated any ppx. I assume they want him to be on ppx as I have been inquiring about this for several days so will reach out to confirm.  - f/u ID recs  - if pt having worsening headaches- stop ART and  repeat LP  -opportunistic infection Ppx on bactrim??? - continue Tivicay/Odefsey - monitor for signs/symptoms of IRIS - monitor fever curve - consult CSW for dc planning with medication to help ensure compliance - following bilirubin levels and INR  NormocyticAnemia: Chronic. Stable without signs of active bleeding.  Likely secondary to anemia of chronic inflammation given uncontrolled HIV/HCV/HBV. Hgb 8.8 today (12.2 on admission 10/5) INR is 1.31 today and has been stable throughout admission. - Continue monitoring for blood loss - CBC daily since hgb still continue to drop  Qtc prolongation - avoid Qtc prolonging medications  Bipolar disorder: Chronic. - Continue monitoring mood and affect - continue seroquel daily - continue Keppra IV  FEN/GI: Regular diet PPx: SCDs (patient not wearing)  Disposition: Pending completion of antifungals for cryptococcus   Subjective:  Overnight: no acute events   Today: patient is sleeping on beginning of exam. Easily aroused by voice. Unpleasant mood but does provide short answers to questions. Denies headache, N/V, diarrhea, or pain.   Objective: Temp:  [98 F (36.7 C)-98.2 F (36.8 C)] 98.2 F (36.8 C) (10/17 0554) Pulse Rate:  [82-91] 90 (10/17 0554) Resp:  [18] 18 (10/17 0554) BP: (106-112)/(66-72) 107/72 (10/17 0554) SpO2:  [98 %-100 %] 100 % (10/17 0554)   Physical Exam: General: well nourished, well developed, sleeping, NAD with non-toxic appearance HEENT: normocephalic, atraumatic, moist mucous membranes Cardiovascular: regular rate and rhythm without murmurs, S3 present Lungs: clear to auscultation bilaterally with normal work of breathing Abdomen: soft, non-tender, distended Skin: warm, dry, diffuse small, healed scars on extremities and trunk Extremities: warm and well perfused, normal tone, no edema Neuro: alert to voice and oriented Psych: grumpy, cooperative  Laboratory: Recent [employer]  10/08/18 0308  10/09/18 0509 10/10/18 0257  WBC 4.4 4.6 5.2  HGB 8.5* 8.6* 8.8*  HCT 25.8* 26.2* 25.5*  PLT 164 170 174   Recent Labs  Lab 10/05/18 0412 10/06/18 0430  10/07/18 0330 10/08/18 0308 10/09/18 0509 10/10/18 0257  NA 138 136   < > 138 140 138 138  K 3.6 3.0*   < > 3.2* 3.3* 3.4* 3.6  CL 113* 114*   < > 116* 115* 115* 113*  CO2 19* 17*   < > 19* 21* 19* 20*  BUN 8 6   < > 6 7 10 8   CREATININE 1.28* 1.10   < > 1.05 1.21 1.11 1.10  CALCIUM 8.1* 8.0*   < > 8.0* 8.3* 8.4* 8.5*  PROT 6.6 7.1  --  6.9  --   --   --   BILITOT 10.5* 9.9*  --  8.5*  --   --   --   ALKPHOS 148* 157*  --  145*  --   --   --   ALT 60* 63*  --  66*  --   --   --   AST 186* 191*  --  187*  --   --   --   GLUCOSE 75 92   < > 81 90 86 92   < > = values in this interval not displayed.   CD4 T cell Abs: 90 CD4% helper T-cell: 8 HIV 1 RNA quant: 3460 HCV antibody: >11.0 HCV genotype: 1A HCV RNA quant: 6,610,000 HBsAg: Positive HBV DNA PCR: 810 QuantiFERON gold: Negative Cryptococcal antigen: Positive, antigen titers 640 CSF labs: Xanthochromatic, protein 65, glucose 39, no growth at 3 days, Direct bilirubin: 16.0 Ammonia: 72 Urinalysis: Unremarkable I-STAT lactic acid: 1.42 Folate: 221 (WNL) B12: 753 (WNL) Ferritin: 497 (H) Magnesium: 1.7 INR: 1.31  Imaging/Diagnostic Tests: EKG 12-Lead: NSR without ST changes or t-wave abnormalities or heart blocks, with QTc 435  CT HEAD WITHOUT CONTRAST (09/30/2018) 1. No evidence of acute intracranial abnormality. 2. Interval removal of ventriculostomy catheter with decreased distention of the frontal horns following resolution of pneumocephalus. Otherwise unchanged mild ventriculomegaly. 3. Areas of encephalomalacia in both cerebral and cerebellar hemispheres corresponding to insults on a 2018 brain MRI.  CT ABDOMEN AND PELVIS WITH CONTRAST (09/28/2018) 1. Mild circumferential wall thickening noted in the right colon. Imaging features suspicious for  infectious/inflammatory colitis. 2. Otherwise unremarkable study.  Leeroy Bock, DO 10/10/2018, 7:34 AM PGY-1, [place] [facility] FPTS Intern pager: [phone], text pages welcome

## 2018-10-10 NOTE — Progress Notes (Signed)
[facility]    Date of Admission:  09/28/2018   Total days of antibiotics 11        Day 11 of ampho plus flucytosine        Day 4 tivicay/descovy  ID: Justin Ball is a 27 y.o. 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 Chlorhexidine Gluconate Cloth  6 each Topical Q0600  . dextrose  10 mL Intravenous Q24H  . dextrose  10 mL Intravenous Q24H  . dolutegravir  50 mg Oral Daily  . emtricitabine-rilpivir-tenofovir AF  1 tablet Oral Q breakfast  . feeding supplement (ENSURE ENLIVE)  237 mL Oral BID BM  . flucytosine  25 mg/kg Oral Q6H  . lactulose  10 g Oral Daily  . LORazepam  0.5 mg Oral Once  . nicotine  7 mg Transdermal Daily  . potassium chloride  40 mEq Oral BID  . prochlorperazine  10 mg Intravenous Daily  . QUEtiapine  25 mg Oral QHS  . sodium chloride  500 mL Intravenous Q24H  . sodium chloride flush  10-40 mL Intracatheter Q12H  . sulfamethoxazole-trimethoprim  1 tablet Oral Daily    Objective: Vital signs in last 24 hours: Temp:  [98.1 F (36.7 C)-98.3 F (36.8 C)] 98.3 F (36.8 C) (10/17 0826) Pulse Rate:  [85-91] 85 (10/17 0826) Resp:  [18] 18 (10/17 0826) BP: (99-112)/(65-72) 99/65 (10/17 0826) SpO2:  [98 %-100 %] 98 % (10/17 0826) Physical Exam  Constitutional: He is oriented to person, place, and  time. He appears well-developed and well-nourished. No distress.  HENT:  Mouth/Throat: Oropharynx is clear and moist. No oropharyngeal exudate.  Cardiovascular: Normal rate, regular rhythm and normal heart sounds. Exam reveals no gallop and no friction rub.  No murmur heard.  Pulmonary/Chest: Effort normal and breath sounds normal. No respiratory distress. He has no wheezes.  Abdominal: Soft. Bowel sounds are normal. He exhibits no distension. There is no tenderness.  Lymphadenopathy:  He has no cervical adenopathy.  Neurological: He is alert and oriented to person, place, and time.  Skin: Skin is warm and dry. No rash noted. No erythema.  Psychiatric: He has a normal mood and affect. His behavior is normal.     Lab Results Recent Labs    10/09/18 0509 10/10/18 0257  WBC 4.6 5.2  HGB 8.6* 8.8*  HCT 26.2* 25.5*  NA 138 138  K 3.4* 3.6  CL 115* 113*  CO2 19* 20*  BUN 10 8  CREATININE 1.11 1.10   Liver Panel No results for input(s): PROT, ALBUMIN, AST, ALT, ALKPHOS, BILITOT, BILIDIR, IBILI in the last 72 hours.  Microbiology: CSF- cx ngtd Studies/Results: No results found.   Assessment/Plan: - plan is unchanged  hiv disease= currently restarted on ART prior to finishing reinduction  per dr hatcher. Appears to tolerate. If having worsening headache will stop his ART. He reports only stopping his ART roughly 3 weeks ago  CM = he was supposed to be on maintenance phase of CM treatment but has been reinduced. Currently on day 11 of 14. Electrolyte repletion being monitored by pharmacy. If he has headache, recommend repeat LP. No growth on culture  Acute hep C = transaminitis improving as well as hyperbilirubinemia.   Chronic hep B = currently on descovy which would treat his hepatitis.  oi proph = continue on bactrim   [facility] Infectious Diseases Cell: [phone] Pager: [phone]  10/10/2018, 2:24 PM

## 2018-10-11 LAB — HEPATIC FUNCTION PANEL
ALBUMIN: 1.7 g/dL — AB (ref 3.5–5.0)
ALK PHOS: 164 U/L — AB (ref 38–126)
ALT: 69 U/L — ABNORMAL HIGH (ref 0–44)
AST: 163 U/L — ABNORMAL HIGH (ref 15–41)
BILIRUBIN DIRECT: 3.4 mg/dL — AB (ref 0.0–0.2)
BILIRUBIN INDIRECT: 3.3 mg/dL — AB (ref 0.3–0.9)
BILIRUBIN TOTAL: 6.7 mg/dL — AB (ref 0.3–1.2)
Total Protein: 7.5 g/dL (ref 6.5–8.1)

## 2018-10-11 LAB — CBC
HEMATOCRIT: 26.6 % — AB (ref 39.0–52.0)
Hemoglobin: 8.9 g/dL — ABNORMAL LOW (ref 13.0–17.0)
MCH: 32.1 pg (ref 26.0–34.0)
MCHC: 33.5 g/dL (ref 30.0–36.0)
MCV: 96 fL (ref 80.0–100.0)
Platelets: 207 10*3/uL (ref 150–400)
RBC: 2.77 MIL/uL — ABNORMAL LOW (ref 4.22–5.81)
RDW: 15.5 % (ref 11.5–15.5)
WBC: 5.5 10*3/uL (ref 4.0–10.5)
nRBC: 0 % (ref 0.0–0.2)

## 2018-10-11 LAB — BASIC METABOLIC PANEL
Anion gap: 4 — ABNORMAL LOW (ref 5–15)
BUN: 9 mg/dL (ref 6–20)
CALCIUM: 8.7 mg/dL — AB (ref 8.9–10.3)
CO2: 20 mmol/L — ABNORMAL LOW (ref 22–32)
CREATININE: 1.15 mg/dL (ref 0.61–1.24)
Chloride: 114 mmol/L — ABNORMAL HIGH (ref 98–111)
GFR calc non Af Amer: >60 mL/min (ref >60)
Glucose, Bld: 89 mg/dL (ref 70–99)
Potassium: 3.7 mmol/L (ref 3.5–5.1)
SODIUM: 138 mmol/L (ref 135–145)

## 2018-10-11 LAB — MAGNESIUM: Magnesium: 1.8 mg/dL (ref 1.7–2.4)

## 2018-10-11 MED ORDER — MAGNESIUM SULFATE 50 % IJ SOLN
3.0000 g | Freq: Once | INTRAVENOUS | Status: AC
  Administered 2018-10-11: 3 g via INTRAVENOUS
  Filled 2018-10-11: qty 6

## 2018-10-11 MED ORDER — DIPHENHYDRAMINE HCL 25 MG PO CAPS
25.0000 mg | ORAL_CAPSULE | Freq: Once | ORAL | Status: AC
  Administered 2018-10-11: 25 mg via ORAL
  Filled 2018-10-11: qty 1

## 2018-10-11 MED ORDER — METHOCARBAMOL 500 MG PO TABS
750.0000 mg | ORAL_TABLET | Freq: Four times a day (QID) | ORAL | Status: DC | PRN
  Administered 2018-10-11 – 2018-10-13 (×3): 750 mg via ORAL
  Filled 2018-10-11 (×3): qty 2

## 2018-10-11 NOTE — Progress Notes (Addendum)
[facility]    Date of Admission:  09/28/2018   Total days of antibiotics 12        Day 12 of ampho plus flucytosine        Day 5 tivicay/descovy  ID: Justin Ball is a 27 y.o. 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 Chlorhexidine Gluconate Cloth  6 each Topical Q0600  . dextrose  10 mL Intravenous Q24H  . dextrose  10 mL Intravenous Q24H  . dolutegravir  50 mg Oral Daily  . emtricitabine-rilpivir-tenofovir AF  1 tablet Oral Q breakfast  . feeding supplement (ENSURE ENLIVE)  237 mL Oral BID BM  . flucytosine  25 mg/kg Oral Q6H  . lactulose  10 g Oral Daily  . LORazepam  0.5 mg Oral Once  . nicotine  14 mg Transdermal Daily  . potassium chloride  40 mEq Oral BID  . prochlorperazine  10 mg Intravenous Daily  . QUEtiapine  25 mg Oral QHS  . sodium chloride  500 mL Intravenous Q24H  . sodium chloride flush  10-40 mL Intracatheter Q12H  . sulfamethoxazole-trimethoprim  1 tablet Oral Daily    Objective: Vital signs in last 24 hours: Temp:  [98.2 F (36.8 C)-99.2 F (37.3 C)] 98.2 F (36.8 C) (10/18 1610) Pulse Rate:  [78-88] 88 (10/18 0613) Resp:  [18-20] 18 (10/18 0613) BP: (88-119)/(58-75) 101/67 (10/18 0613) SpO2:  [98 %-100 %] 98 % (10/18 9604) Physical Exam  Constitutional: He is oriented to person, place, and time,  sleepy. He appears well-developed and well-nourished. No distress.  HENT:  Mouth/Throat: Oropharynx is clear and moist. No oropharyngeal exudate.  Cardiovascular: Normal rate, regular rhythm and normal heart sounds. Exam reveals no gallop and no friction rub.  No murmur heard.  Pulmonary/Chest: Effort normal and breath sounds normal. No respiratory distress. He has no wheezes.  Abdominal: Soft. Bowel sounds are normal. He exhibits no distension. There is no tenderness.  Lymphadenopathy:  He has no cervical adenopathy.  Neurological: He is alert and oriented to person, place, and time.  Skin: Skin is warm and dry. No rash noted. No erythema.  Psychiatric: He has a normal mood and affect. His behavior is normal.    Lab Results Recent Labs    10/10/18 0257 10/11/18 0405  WBC 5.2 5.5  HGB 8.8* 8.9*  HCT 25.5* 26.6*  NA 138 138  K 3.6 3.7  CL 113* 114*  CO2 20* 20*  BUN 8 9  CREATININE 1.10 1.15    Microbiology: CSF- cx ngtd Studies/Results: No results found.   Assessment/Plan: - plan is unchanged  hiv disease= currently restarted on ART, tivicay and odefsey prior to finishing reinduction per dr hatcher. Appears to tolerate. If having worsening headache will stop his ART.    CM =  Currently on day 12  of 14. Electrolyte repletion by pharmacy.requiring more magnesium. No growth on culture  Acute hep C = transaminitis continues to improve as well as hyperbilirubinemia.  Will check hepatic panel today  Chronic hep B = currently on odefsey which would treat his hepatitis.  oi proph = continue on bactrim   I will see back on Monday. Dr Luciana Axe available for questions  [facility] for Infectious Diseases Cell: [phone] Pager: [phone]  10/11/2018, 10:32 AM

## 2018-10-11 NOTE — Progress Notes (Addendum)
FPTS Interim Progress Note  S: Pt notes some low back pain that started " few minutes ago" at the sight of his lumbar puncture. He notes that it is mostly just itchy but it is also "a little painful" with a rating of 9/10. He told the nurse it felt swollen and very tender. Patient denies any nausea vomiting or dizziness. Does note a "minor" frontal headache of 7/10 that he has had all day. He notes that it is relieved with tylenol.   O: BP 112/74 (BP Location: Left Arm)   Pulse 100   Temp 98.7 F (37.1 C) (Oral)   Resp 16   Ht 6\' 1"  (1.854 m)   Wt 86.6 kg   SpO2 100%   BMI 25.20 kg/m   Gen: Well appearing, sitting up on side of bed, conversing well  Skin: No erythema, edema, or warmth noted along entire lower back bilaterally. No rashes.  MSK: Tender to light palpation at lumbar puncture sight extending laterally to PSIS bilaterally       A/P: Paraspinal low back pain: Patient is hemodynamically stable and afebrile. Area does not appear to be infected (see media above) at this time. Notes pain is 9/10 but describes it as "not that bad". Appears to be MSK related per patient's description and tenderness pattern during exam. Will give muscle relaxer and heating pad and continue to monitor. -Begin Methocarbamol 750mg  q6 prn  -K-pad   Pruritis:  Itchiness most likely secondary to adhesive bandage as the bandage was just recently removed. Per patient, this is his biggest complaint at this time.Patient has benadryl PRN, recommended taking dose of benadryl to see if that helps relieve the itch. - Benadryl PO - Will continue to monitor   Joana Reamer, DO 10/11/2018, 8:38 PM PGY-1, [facility] Family Medicine Service pager [phone]

## 2018-10-11 NOTE — Progress Notes (Signed)
Pharmacy Antibiotic Note  Justin Ball is a 27 y.o. 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 Pharmacy has been consulted for Ambisome and flucytosine dosing, as well as electrolyte monitoring. WBC WNL, currently AF. Scr 1.1>1.15, estimated CrCl ~ 110 mL/min. K today is 3.7 after replacement with 80 mEQ yesterday and magensium 1.8 after replacement with 3 gm yesterday. Today is D#12/14.  Plan: Continue Ambisome 350mg  (~4 mg/kg) IV q24h and monitor pain Continue flucytosine 2000mg  (~25 mg/kg) PO q6h Potassium 40 mEq BID for replacement maintenance  Switched potassium formulation from one 40 mEq potassium tablet to four of the 10 mEq tablets on 10/15. Will continue to utilize this formulation for now. Will give magnesium 3 g IV this AM for replacement Daily BMET and Mg while on Ambisome Monitor CBC on Flucytosine  Will continue to follow renal function and replace electrolytes as appropriate Will watch SCr and increase fluid boluses as necessary  Height: 6\' 1"  (185.4 cm) Weight: 191 lb (86.6 kg) IBW/kg (Calculated) : 79.9  Temp (24hrs), Avg:98.6 F (37 C), Min:98.2 F (36.8 C), Max:99.2 F (37.3 C)  Recent Labs  Lab 10/06/18 0430  10/07/18 0330 10/08/18 0308 10/09/18 0509 10/10/18 0257 10/11/18 0405  WBC 4.4  --   --  4.4 4.6 5.2 5.5  CREATININE 1.10   < > 1.05 1.21 1.11 1.10 1.15   < > = values in this interval not displayed.    Estimated Creatinine Clearance: 109 mL/min (by C-G formula based on SCr of 1.15 mg/dL).    Allergies  Allergen Reactions  . Acyclovir And Related Itching   Antimicrobials this admission: Ambisome 10/7 >>  Flucytosine 10/7 >>   Microbiology results: 10/8 CSF cx: NG  Thank you for allowing pharmacy to be a part of this patient's care.  Danae Orleans, PharmD PGY1 Pharmacy Resident Phone [phone] 10/11/2018       8:13 AM

## 2018-10-11 NOTE — Progress Notes (Signed)
Family Medicine Teaching Service Daily Progress Note Intern Pager: [phone]  Patient name: Justin Ball Medical record number: [id number] Date of birth: 02-21-1991 Age: 27 y.o. Gender: male  Primary Care Provider: Patient, No Pcp Per Consultants: Gastroenterology, Neurology, Infectious Disease Code Status: Full  Pt Overview and Major Events to Date:  10/05: Admit for malaise and jaundice found to have uncontrolled HIV and HCV 10/08: Received lumbar puncture, positive cryptococcus antigen and was started on antifungals by ID  Assessment and Plan: Justin R Coleis a 27 y.o.malepresenting with 2 week h/o malaise and jaundice. PMH is significant forchronic hepatitis B, HIV on ART, h/o cryptococcal meningoencephalitis, bipolar d/o.  Cryptococcal meningoencephalitis: Acute. No signs of AMS or systemic infection. No growth on culture. Continue antifungal treatment today (day 12/14). - ID recs appreciated - Continue amphotericin and flucytosine for completion of 14-day treatment (10/07-10/21) - repeat LP if develops worsening headache  Hypokalemia/hypomagnesium - pharmacy monitoring and replenishing scheduled K+ and magnesium. Potassium today 3.7, Magnesium 1.8 - daily BMP - daily magnesium - replenish K+ with IV or tablets per pharm  Uncontrolled HIV  Hepatitis C  Hepatitis B: Patient denies any nausea or diarrhea. Pt denies any headache or pains. Jaundice is greatly improved since admission. Mental status is at baseline. GI recommends HCV treatment plan of repeat RNA VL at 6 weeks, 12 weeks, and 24 weeks time with abstinence from alcohol and illicit drugs. ART (Tivicay and Odefsey restarted 10/14). ID did appear to put on opportunistic infection Ppx now.  - f/u ID recs  - if pt having worsening headaches- stop ART and repeat LP  - opportunistic infection Ppx on bactrim - continue Tivicay/Odefsey - monitor for signs/symptoms of IRIS - monitor fever curve - consult CSW for dc  planning with medication to help ensure compliance - following bilirubin levels and INR  NormocyticAnemia: Chronic. Stable without signs of active bleeding.  Likely secondary to anemia of chronic inflammation given uncontrolled HIV/HCV/HBV. Hgb 8.9 today (12.2 on admission 10/5) INR has been stable throughout admission- did not check today. - Continue monitoring for blood loss - CBC daily  Qtc prolongation - avoid Qtc prolonging medications  Bipolar disorder: Chronic. - Continue monitoring mood and affect - continue seroquel daily - continue Keppra IV  Tobacco use disorder- patient wanted to leave hospital to smoke. Increasing dose of nicotine patch to help with cravings. - increased nicotine patch from 7mg  to 14mg  daily  FEN/GI: Regular diet PPx: SCDs (patient not wearing)  Disposition: Pending completion of antifungals for cryptococcus   Subjective:  Overnight: no acute events. Patient wanted to leave hospital.  Today: Patient is up and walking around. I told him about his labs today when he inquired. He states that he feels fine. He denies headache, N/V, diarrhea, or any other pains. He requested a chocolate ensure and ginger ale. He wants to go home. He has no other concerns or questions at this time.   Objective: Temp:  [98.2 F (36.8 C)-99.2 F (37.3 C)] 98.2 F (36.8 C) (10/18 0613) Pulse Rate:  [78-88] 88 (10/18 0613) Resp:  [18-20] 18 (10/18 0613) BP: (88-119)/(58-75) 101/67 (10/18 0613) SpO2:  [98 %-100 %] 98 % (10/18 5784)   Physical Exam: General: well nourished, well developed, NAD with non-toxic appearance HEENT: normocephalic, atraumatic, moist mucous membranes Cardiovascular: regular rate and rhythm without murmurs, S3 present Lungs: clear to auscultation bilaterally with normal work of breathing Abdomen: soft, non-tender, distended Skin: warm, dry, diffuse small, healed scars on extremities and  trunk Extremities: warm and well perfused, normal tone, no  edema Neuro: alert and oriented Psych: cooperative, flat affect   Laboratory: Recent Labs  Lab 10/09/18 0509 10/10/18 0257 10/11/18 0405  WBC 4.6 5.2 5.5  HGB 8.6* 8.8* 8.9*  HCT 26.2* 25.5* 26.6*  PLT 170 174 207   Recent Labs  Lab 10/05/18 0412 10/06/18 0430  10/07/18 0330  10/09/18 0509 10/10/18 0257 10/11/18 0405  NA 138 136   < > 138   < > 138 138 138  K 3.6 3.0*   < > 3.2*   < > 3.4* 3.6 3.7  CL 113* 114*   < > 116*   < > 115* 113* 114*  CO2 19* 17*   < > 19*   < > 19* 20* 20*  BUN 8 6   < > 6   < > 10 8 9   CREATININE 1.28* 1.10   < > 1.05   < > 1.11 1.10 1.15  CALCIUM 8.1* 8.0*   < > 8.0*   < > 8.4* 8.5* 8.7*  PROT 6.6 7.1  --  6.9  --   --   --   --   BILITOT 10.5* 9.9*  --  8.5*  --   --   --   --   ALKPHOS 148* 157*  --  145*  --   --   --   --   ALT 60* 63*  --  66*  --   --   --   --   AST 186* 191*  --  187*  --   --   --   --   GLUCOSE 75 92   < > 81   < > 86 92 89   < > = values in this interval not displayed.   CD4 T cell Abs: 90 CD4% helper T-cell: 8 HIV 1 RNA quant: 3460 HCV antibody: >11.0 HCV genotype: 1A HCV RNA quant: 6,610,000 HBsAg: Positive HBV DNA PCR: 810 QuantiFERON gold: Negative Cryptococcal antigen: Positive, antigen titers 640 CSF labs: Xanthochromatic, protein 65, glucose 39, no growth at 3 days, Direct bilirubin: 16.0 Ammonia: 72 Urinalysis: Unremarkable I-STAT lactic acid: 1.42 Folate: 221 (WNL) B12: 753 (WNL) Ferritin: 497 (H) Magnesium: 1.8 INR: 1.31  Imaging/Diagnostic Tests: EKG 12-Lead: NSR without ST changes or t-wave abnormalities or heart blocks, with QTc 435  CT HEAD WITHOUT CONTRAST (09/30/2018) 1. No evidence of acute intracranial abnormality. 2. Interval removal of ventriculostomy catheter with decreased distention of the frontal horns following resolution of pneumocephalus. Otherwise unchanged mild ventriculomegaly. 3. Areas of encephalomalacia in both cerebral and cerebellar hemispheres corresponding to  insults on a 2018 brain MRI.  CT ABDOMEN AND PELVIS WITH CONTRAST (09/28/2018) 1. Mild circumferential wall thickening noted in the right colon. Imaging features suspicious for infectious/inflammatory colitis. 2. Otherwise unremarkable study.  Justin Bock, DO 10/11/2018, 9:01 AM PGY-1, [facility] FPTS Intern pager: [phone], text pages welcome

## 2018-10-12 LAB — BASIC METABOLIC PANEL
Anion gap: 7 (ref 5–15)
BUN: 12 mg/dL (ref 6–20)
CO2: 20 mmol/L — ABNORMAL LOW (ref 22–32)
Calcium: 8.4 mg/dL — ABNORMAL LOW (ref 8.9–10.3)
Chloride: 111 mmol/L (ref 98–111)
Creatinine, Ser: 1.3 mg/dL — ABNORMAL HIGH (ref 0.61–1.24)
GFR calc Af Amer: >60 mL/min (ref >60)
Glucose, Bld: 89 mg/dL (ref 70–99)
POTASSIUM: 3.6 mmol/L (ref 3.5–5.1)
Sodium: 138 mmol/L (ref 135–145)

## 2018-10-12 LAB — CBC
HCT: 24.1 % — ABNORMAL LOW (ref 39.0–52.0)
Hemoglobin: 8.2 g/dL — ABNORMAL LOW (ref 13.0–17.0)
MCH: 32.4 pg (ref 26.0–34.0)
MCHC: 34 g/dL (ref 30.0–36.0)
MCV: 95.3 fL (ref 80.0–100.0)
PLATELETS: 206 10*3/uL (ref 150–400)
RBC: 2.53 MIL/uL — AB (ref 4.22–5.81)
RDW: 15 % (ref 11.5–15.5)
WBC: 5.2 10*3/uL (ref 4.0–10.5)
nRBC: 0 % (ref 0.0–0.2)

## 2018-10-12 LAB — BILIRUBIN, FRACTIONATED(TOT/DIR/INDIR)
Bilirubin, Direct: 3.1 mg/dL — ABNORMAL HIGH (ref 0.0–0.2)
Indirect Bilirubin: 2.8 mg/dL — ABNORMAL HIGH (ref 0.3–0.9)
Total Bilirubin: 5.9 mg/dL — ABNORMAL HIGH (ref 0.3–1.2)

## 2018-10-12 LAB — MAGNESIUM: Magnesium: 1.9 mg/dL (ref 1.7–2.4)

## 2018-10-12 MED ORDER — MAGNESIUM SULFATE 50 % IJ SOLN
3.0000 g | Freq: Once | INTRAVENOUS | Status: AC
  Administered 2018-10-12: 3 g via INTRAVENOUS
  Filled 2018-10-12 (×3): qty 6

## 2018-10-12 NOTE — Progress Notes (Signed)
Consult received for medication assistance. Patient has Medicaid for prescriptions. There are no copay cards for HIV meds. Patient follows at ID clinic, gets his HIV meds through [facility] outpatient pharmacy per notes in chart review.

## 2018-10-12 NOTE — Progress Notes (Signed)
   10/11/18 2008  Pain Assessment  Pain Scale 0-10  Pain Score 9  Pain Type Acute pain  Pain Location Back  Pain Orientation Lower;Mid  Pain Descriptors / Indicators Throbbing;Sore  Pain Frequency Constant  Pain Onset Sudden  Patients Stated Pain Goal 2  Pain Intervention(s) MD notified (Comment);Emotional support  Multiple Pain Sites Yes  2nd Pain Site  Pain Score 4  Pain Type Acute pain  Pain Location Arm  Pain Orientation Right;Upper  Pain Descriptors / Indicators Burning  Pain Frequency Intermittent  Pain Onset On-going  Pain Intervention(s) Emotional support  Provider Notification  Provider Name/Title MD  Date Provider Notified 10/11/18  Time Provider Notified 2012  Notification Type Page  Notification Reason Change in status (c/o severe pain to lower back)  Response Other (Comment) (MD to see patient)  Date of Provider Response 10/11/18  Time of Provider Response 2018   New orders received.  Benadryl PO, Ibuprofen PO, and Robaxin PO given at 2110 with excellent relief.  Will continue to monitor patient.  Bernie Covey RN-BC, [employer]

## 2018-10-12 NOTE — Progress Notes (Signed)
Pharmacy Antibiotic Note  Justin Ball is a 27 y.o. 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 Pharmacy has been consulted for Ambisome and flucytosine dosing, as well as electrolyte monitoring. WBC WNL, currently AF. Scr bump this morning 1.1>1.3 estimated CrCl ~ 96.5 mL/min. K today is 3.6 after replacement with 80 mEQ yesterday and magensium 1.8>>1.9 after replacement with 3 gm yesterday. Today is D#13/14.  Plan: Continue Ambisome 350mg  (~4 mg/kg) IV q24h and monitor pain Continue flucytosine 2000mg  (~25 mg/kg) PO q6h Continue Potassium 40 mEq BID for replacement Will give magnesium 3g IV this AM for replacement Daily BMET and Mg while on Ambisome Monitor CBC on Flucytosine  Will continue to follow renal function and replace electrolytes as appropriate Will watch SCr and increase fluid boluses as necessary  Height: 6\' 1"  (185.4 cm) Weight: 191 lb (86.6 kg) IBW/kg (Calculated) : 79.9  Temp (24hrs), Avg:98.3 F (36.8 C), Min:98 F (36.7 C), Max:98.7 F (37.1 C)  Recent Labs  Lab 10/08/18 0308 10/09/18 0509 10/10/18 0257 10/11/18 0405 10/12/18 0330  WBC 4.4 4.6 5.2 5.5 5.2  CREATININE 1.21 1.11 1.10 1.15 1.30*    Estimated Creatinine Clearance: 96.5 mL/min (A) (by C-G formula based on SCr of 1.3 mg/dL (H)).    Allergies  Allergen Reactions  . Acyclovir And Related Itching   Antimicrobials this admission: Ambisome 10/7 >>  Flucytosine 10/7 >>   Microbiology results: 10/8 CSF cx: NG  Thank you for allowing pharmacy to be a part of this patient's care.  Danae Orleans, PharmD PGY1 Pharmacy Resident Phone [phone] 10/12/2018       6:50 AM

## 2018-10-12 NOTE — Progress Notes (Addendum)
Family Medicine Teaching Service Daily Progress Note Intern Pager: [phone]  Patient name: Justin Ball Medical record number: [id number] Date of birth: 1991-04-16 Age: 27 y.o. Gender: male  Primary Care Provider: Patient, No Pcp Per Consultants: Gastroenterology, Neurology, Infectious Disease Code Status: Full  Pt Overview and Major Events to Date:  10/05: Admit for malaise and jaundice found to have uncontrolled HIV and HCV 10/08: Received lumbar puncture, positive cryptococcus antigen and was started on antifungals by ID 10/14: Started ART therapy  Assessment and Plan: Justin R Coleis a 27 y.o.malepresenting with 2 week h/o malaise and jaundice. PMH is significant forchronic hepatitis B, HIV on ART, h/o cryptococcal meningoencephalitis, bipolar d/o.  Cryptococcal meningoencephalitis: Acute, Stable No signs of AMS or systemic infection.  CSF culture without growth. Cont antifungal tx (day 13/14).  Did note back pain at LP site overnight with itching, likely from adhesive tape that had been removed.  No signs of infection, pain was paraspinal.  This AM states that pain has resolved. - ID recs appreciated - Continue amphotericin and flucytosine for completion of 14-day treatment (10/07-10/21) - repeat LP if develops worsening headache  Hypokalemia/hypomagnesium - pharmacy monitoring and replenishing scheduled K+ and magnesium. Potassium today 3.6, Magnesium 1.9, pharmacy order Mag repletion this AM. - daily BMP - daily magnesium - replenish K+ with IV or tablets per pharm  - patient receiving K-Dur BID per pharm  Uncontrolled HIV  Hepatitis C  Hepatitis B: GI recommends HCV treatment plan of repeat RNA VL at 6 weeks, 12 weeks, and 24 weeks time with abstinence from alcohol and illicit drugs. ART (Tivicay and Odefsey restarted 10/14).  Headache stable.  Mental status at baseline.  No N/V/D.  No signs of IRIS.  Bilirubin 10/19 Total 5.9(16 on admission), Direct 3.1 (28  on admission), Indirect 2.8 (7 on admission). - f/u ID recs  - if pt having worsening headaches- stop ART and repeat LP  - opportunistic infection Ppx on bactrim - continue Tivicay/Odefsey - monitor for signs/symptoms of IRIS - monitor fever curve - consult CSW for dc planning with medication to help ensure compliance - following bilirubin levels and INR  NormocyticAnemia: Chronic. Stable without signs of active bleeding.  Likely secondary to anemia of chronic inflammation given uncontrolled HIV/HCV/HBV. Hgb 8.9>8.2 today (12.2 on admission 10/5) INR has been stable throughout admission, last checked 10/17. - Continue monitoring for blood loss - CBC daily  Qtc prolongation QTc 498 on 10/6, repeat EKG on 10/7 improved to 435. - avoid Qtc prolonging medications  Bipolar disorder: Chronic. - Continue monitoring mood and affect - continue seroquel daily - continue Keppra IV  Tobacco use disorder- Cravings currently well controlled. - cont nicotine patch 14mg  daily  FEN/GI: Regular diet PPx: SCDs (patient not wearing)  Disposition: Pending completion of antifungals for cryptococcus   Subjective:  Overnight patient was complaining of lower back pain at site of LP on 10/8 and itching.  Also noted "minor" frontal headache he rated 7/10.  Pain improved with Robaxin, K-pad, and Benadryl for itching.  This AM notes that back pain has resolved.  Denies current headache.  Has no other complaints.  Is looking forward to going home in a few days.  Objective: Temp:  [98 F (36.7 C)-98.7 F (37.1 C)] 98.1 F (36.7 C) (10/19 0513) Pulse Rate:  [79-100] 79 (10/19 0513) Resp:  [16-20] 16 (10/19 0513) BP: (89-117)/(53-74) 89/53 (10/19 0513) SpO2:  [98 %-100 %] 98 % (10/19 0513)   Physical Exam: General:  27 y.o. male in NAD HEENT: PERRL, scleral icterus Cardio: RRR no m/r/g Lungs: CTAB, no wheezing, no rhonchi, no crackles Abdomen: Soft, non-tender to palpation, positive bowel  sounds Skin: warm and dry Neuro: alert and conversing appropriately Psych: flat affect Extremities: No edema   Laboratory: Recent Labs  Lab 10/10/18 0257 10/11/18 0405 10/12/18 0330  WBC 5.2 5.5 5.2  HGB 8.8* 8.9* 8.2*  HCT 25.5* 26.6* 24.1*  PLT 174 207 206   Recent Labs  Lab 10/06/18 0430  10/07/18 0330  10/10/18 0257 10/11/18 0405 10/12/18 0330  NA 136   < > 138   < > 138 138 138  K 3.0*   < > 3.2*   < > 3.6 3.7 3.6  CL 114*   < > 116*   < > 113* 114* 111  CO2 17*   < > 19*   < > 20* 20* 20*  BUN 6   < > 6   < > 8 9 12   CREATININE 1.10   < > 1.05   < > 1.10 1.15 1.30*  CALCIUM 8.0*   < > 8.0*   < > 8.5* 8.7* 8.4*  PROT 7.1  --  6.9  --   --  7.5  --   BILITOT 9.9*  --  8.5*  --   --  6.7* 5.9*  ALKPHOS 157*  --  145*  --   --  164*  --   ALT 63*  --  66*  --   --  69*  --   AST 191*  --  187*  --   --  163*  --   GLUCOSE 92   < > 81   < > 92 89 89   < > = values in this interval not displayed.   CD4 T cell Abs: 90 CD4% helper T-cell: 8 HIV 1 RNA quant: 3460 HCV antibody: >11.0 HCV genotype: 1A HCV RNA quant: 6,610,000 HBsAg: Positive HBV DNA PCR: 810 QuantiFERON gold: Negative Cryptococcal antigen: Positive, antigen titers 640 CSF labs: Xanthochromatic, protein 65, glucose 39, no growth at 3 days, Direct bilirubin: 16.0 Ammonia: 72 Urinalysis: Unremarkable I-STAT lactic acid: 1.42 Folate: 221 (WNL) B12: 753 (WNL) Ferritin: 497 (H) Magnesium: 1.8 INR: 1.31  Imaging/Diagnostic Tests: EKG 12-Lead: NSR without ST changes or t-wave abnormalities or heart blocks, with QTc 435  CT HEAD WITHOUT CONTRAST (09/30/2018) 1. No evidence of acute intracranial abnormality. 2. Interval removal of ventriculostomy catheter with decreased distention of the frontal horns following resolution of pneumocephalus. Otherwise unchanged mild ventriculomegaly. 3. Areas of encephalomalacia in both cerebral and cerebellar hemispheres corresponding to insults on a 2018 brain  MRI.  CT ABDOMEN AND PELVIS WITH CONTRAST (09/28/2018) 1. Mild circumferential wall thickening noted in the right colon. Imaging features suspicious for infectious/inflammatory colitis. 2. Otherwise unremarkable study.  Justin Jim, DO 10/12/2018, 8:18 AM PGY-1, [facility] FPTS Intern pager: [phone], text pages welcome

## 2018-10-13 LAB — CBC
HCT: 22.5 % — ABNORMAL LOW (ref 39.0–52.0)
Hemoglobin: 8.1 g/dL — ABNORMAL LOW (ref 13.0–17.0)
MCH: 33.8 pg (ref 26.0–34.0)
MCHC: 36 g/dL (ref 30.0–36.0)
MCV: 93.8 fL (ref 80.0–100.0)
NRBC: 0 % (ref 0.0–0.2)
PLATELETS: 217 10*3/uL (ref 150–400)
RBC: 2.4 MIL/uL — AB (ref 4.22–5.81)
RDW: 15.1 % (ref 11.5–15.5)
WBC: 5.6 10*3/uL (ref 4.0–10.5)

## 2018-10-13 LAB — BASIC METABOLIC PANEL
ANION GAP: 4 — AB (ref 5–15)
BUN: 13 mg/dL (ref 6–20)
CALCIUM: 8.6 mg/dL — AB (ref 8.9–10.3)
CO2: 22 mmol/L (ref 22–32)
Chloride: 114 mmol/L — ABNORMAL HIGH (ref 98–111)
Creatinine, Ser: 1.12 mg/dL (ref 0.61–1.24)
GFR calc non Af Amer: >60 mL/min (ref >60)
GLUCOSE: 114 mg/dL — AB (ref 70–99)
POTASSIUM: 3.4 mmol/L — AB (ref 3.5–5.1)
SODIUM: 140 mmol/L (ref 135–145)

## 2018-10-13 LAB — MAGNESIUM: MAGNESIUM: 1.8 mg/dL (ref 1.7–2.4)

## 2018-10-13 MED ORDER — FLUCONAZOLE 100 MG PO TABS
400.0000 mg | ORAL_TABLET | Freq: Every day | ORAL | Status: DC
  Administered 2018-10-13 – 2018-10-14 (×2): 400 mg via ORAL
  Filled 2018-10-13 (×2): qty 4

## 2018-10-13 MED ORDER — FLUCONAZOLE IN SODIUM CHLORIDE 400-0.9 MG/200ML-% IV SOLN: 400.0000 mg | INTRAVENOUS | Status: DC

## 2018-10-13 MED ORDER — POTASSIUM CHLORIDE CRYS ER 20 MEQ PO TBCR
20.0000 meq | EXTENDED_RELEASE_TABLET | Freq: Once | ORAL | Status: AC
  Administered 2018-10-13: 20 meq via ORAL
  Filled 2018-10-13: qty 1

## 2018-10-13 MED ORDER — MAGNESIUM SULFATE 4 GM/100ML IV SOLN
4.0000 g | Freq: Once | INTRAVENOUS | Status: AC
  Administered 2018-10-13: 4 g via INTRAVENOUS
  Filled 2018-10-13: qty 100

## 2018-10-13 NOTE — Progress Notes (Signed)
Pharmacy Antibiotic Note  Justin Ball is a 28 y.o. 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 Pharmacy has been consulted for Ambisome and flucytosine dosing, as well as electrolyte monitoring.  Electrolytes have been replaced earlier today. Today is D#14/14 of Ambisome/Flucytosine for induction therapy - discussed with Dr. Luciana Axe and will plan to transition to Fluconazole tomorrow.   Plan: - Add stop date for Ambisome, Flucytosine, NS boluses, and D5 flushes on 10/20 after today's doses - Start Fluconazole 400 mg po every 24 hours starting on 10/21 (po okayed by Luciana Axe) - Will continue KCl 40 mEq bid thru 10/21, then responsibility for repletion will fall to rounding team - Will f/u lytes in the AM  Height: 6\' 1"  (185.4 cm) Weight: 191 lb (86.6 kg) IBW/kg (Calculated) : 79.9  Temp (24hrs), Avg:98.5 F (36.9 C), Min:98.2 F (36.8 C), Max:98.7 F (37.1 C)  Recent Labs  Lab 10/09/18 0509 10/10/18 0257 10/11/18 0405 10/12/18 0330 10/13/18 0446  WBC 4.6 5.2 5.5 5.2 5.6  CREATININE 1.11 1.10 1.15 1.30* 1.12    Estimated Creatinine Clearance: 112 mL/min (by C-G formula based on SCr of 1.12 mg/dL).    Allergies  Allergen Reactions  . Acyclovir And Related Itching   Antimicrobials this admission: Ambisome 10/7 >> (10/20) Flucytosine 10/7 >> (10/20) Fluconazole 10/20 >>  Microbiology results: 10/8 CSF cx: NG  Thank you for allowing pharmacy to be a part of this patient's care.  Georgina Pillion, PharmD, BCPS Clinical Pharmacist Please check AMION for all [facility] Pharmacy numbers 10/13/2018 1:31 PM

## 2018-10-13 NOTE — Progress Notes (Signed)
Pharmacy Antibiotic Note  Justin Ball is a 27 y.o. 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 Pharmacy has been consulted for Ambisome and flucytosine dosing, as well as electrolyte monitoring. WBC WNL, currently AF. Scr down this morning 1.3>>1.12 estimated CrCl ~ 112 mL/min. K today is down to 3.4 after replacement with 80 mEQ yesterday and magnesium 1.9>>1.8 after replacement with 3 gm yesterday. Today is D#14/14.  Plan: Continue Ambisome 350mg  (~4 mg/kg) IV q24h and monitor pain Continue flucytosine 2000mg  (~25 mg/kg) PO q6h Continue Potassium 40 mEq BID for replacement, will add one time dose of today Will give magnesium 4g IV this AM for replacement Daily BMET and Mg while on Ambisome Monitor CBC on Flucytosine  Will continue to follow renal function and replace electrolytes as appropriate Will watch SCr and increase fluid boluses as necessary  Height: 6\' 1"  (185.4 cm) Weight: 191 lb (86.6 kg) IBW/kg (Calculated) : 79.9  Temp (24hrs), Avg:98.5 F (36.9 C), Min:98.2 F (36.8 C), Max:98.7 F (37.1 C)  Recent Labs  Lab 10/09/18 0509 10/10/18 0257 10/11/18 0405 10/12/18 0330 10/13/18 0446  WBC 4.6 5.2 5.5 5.2 5.6  CREATININE 1.11 1.10 1.15 1.30* 1.12    Estimated Creatinine Clearance: 112 mL/min (by C-G formula based on SCr of 1.12 mg/dL).    Allergies  Allergen Reactions  . Acyclovir And Related Itching   Antimicrobials this admission: Ambisome 10/7 >>  Flucytosine 10/7 >>   Microbiology results: 10/8 CSF cx: NG  Thank you for allowing pharmacy to be a part of this patient's care.  Wendelyn Breslow, PharmD PGY1 Pharmacy Resident Phone: [phone] 10/13/2018 7:40 AM

## 2018-10-13 NOTE — Progress Notes (Signed)
Patient urinated on the floor.R.N. Cleaned the room and removed all clutters.Patient offered to have a sponge bath but refused ,but the patient allowed the nurse to change his gown and change the whole bed linens.

## 2018-10-13 NOTE — Progress Notes (Signed)
Family Medicine Teaching Service Daily Progress Note Intern Pager: [phone]  Patient name: Justin Ball Medical record number: [id number] Date of birth: 08/04/1991 Age: 27 y.o. Gender: male  Primary Care Provider: Patient, No Pcp Per Consultants: Gastroenterology, Neurology, Infectious Disease Code Status: Full  Pt Overview and Major Events to Date:  10/05: Admit for malaise and jaundice found to have uncontrolled HIV and HCV 10/08: Received lumbar puncture, positive cryptococcus antigen and was started on antifungals by ID 10/14: Restarted ART therapy  Assessment and Plan: Justin R Coleis a 27 y.o.malepresenting with 2 week h/o malaise and jaundice. PMH is significant forchronic hepatitis B, HIV on ART, h/o cryptococcal meningoencephalitis, bipolar d/o.  Cryptococcal meningoencephalitis: Acute, Stable No signs of AMS or systemic infection.  CSF culture without growth. - ID recs appreciated - Continue amphotericin and flucytosine for completion of 14-day treatment (10/07-10/21) - repeat LP if develops worsening headache  Hypokalemia/hypomagnesium - pharmacy monitoring and replenishing scheduled K+ and magnesium. Potassium today 3.6, Magnesium 1.9, pharmacy order Mag repletion this AM. - daily BMP - daily magnesium - replenish K+ with IV or tablets per pharm  - patient receiving K-Dur BID per pharm - 4g IV magnesium today per pharm - BID K+ plus today per pharm  Uncontrolled HIV  Hepatitis C  Hepatitis B: GI recommends HCV treatment plan of repeat RNA VL at 6 weeks, 12 weeks, and 24 weeks time with abstinence from alcohol and illicit drugs. ART (Tivicay and Odefsey restarted 10/14).  Headache stable.  Mental status at baseline.  No N/V/D.  No signs of IRIS.  Bilirubin 10/19 Total 5.9(16 on admission), Direct 3.1 (28 on admission), Indirect 2.8 (7 on admission). - f/u ID recs  - if pt having worsening headaches- stop ART and repeat LP  - opportunistic  infection Ppx on bactrim - continue Tivicay/Odefsey - continue lactulose - continue bactrim prophylaxis - monitor for signs/symptoms of IRIS - monitor fever curve - f/u CM and transitional pharmacy for dc planning with medication to help ensure compliance - following bilirubin levels and INR  NormocyticAnemia: Chronic. Stable without signs of active bleeding.  Likely secondary to anemia of chronic inflammation given uncontrolled HIV/HCV/HBV. Hgb 8.1 today (12.2 on admission 10/5) INR has been stable throughout admission, last checked 10/17. - Continue monitoring for blood loss - CBC daily  Qtc prolongation QTc 498 on 10/6, repeat EKG on 10/7 improved to 435. - avoid Qtc prolonging medications  Bipolar disorder: Chronic. - Continue monitoring mood and affect - continue seroquel daily - continue Keppra IV  Tobacco use disorder- Cravings currently well controlled. - cont nicotine patch 14mg  daily  FEN/GI: Regular diet PPx: SCDs (patient not wearing)  Disposition: Pending completion of antifungals for cryptococcus   Subjective:  Overnight: no acute events  Today: patient denies any pain or issues he endorses doing ok. He is sleeping and isn't talking much so can recheck later today.  Objective: Temp:  [98.2 F (36.8 C)-98.7 F (37.1 C)] 98.7 F (37.1 C) (10/20 0419) Pulse Rate:  [92-109] 98 (10/20 0419) Resp:  [16-19] 18 (10/20 0419) BP: (92-111)/(45-67) 100/55 (10/20 0419) SpO2:  [96 %-100 %] 96 % (10/20 0419)   Physical Exam: General: 27 y.o. male in NAD, sleeping in bed Cardio: RRR no m/r/g Lungs: CTAB, no wheezing, no rhonchi, no crackles Abdomen: Soft, non-tender to palpation, positive bowel sounds Skin: warm and dry Psych: flat affect Extremities: No edema  Laboratory: Recent Labs  Lab 10/11/18 0405 10/12/18 0330 10/13/18 0446  WBC 5.5 5.2 5.6  HGB 8.9* 8.2* 8.1*  HCT 26.6* 24.1* 22.5*  PLT 207 206 217   Recent Labs  Lab 10/07/18 0330   10/11/18 0405 10/12/18 0330 10/13/18 0446  NA 138   < > 138 138 140  K 3.2*   < > 3.7 3.6 3.4*  CL 116*   < > 114* 111 114*  CO2 19*   < > 20* 20* 22  BUN 6   < > 9 12 13   CREATININE 1.05   < > 1.15 1.30* 1.12  CALCIUM 8.0*   < > 8.7* 8.4* 8.6*  PROT 6.9  --  7.5  --   --   BILITOT 8.5*  --  6.7* 5.9*  --   ALKPHOS 145*  --  164*  --   --   ALT 66*  --  69*  --   --   AST 187*  --  163*  --   --   GLUCOSE 81   < > 89 89 114*   < > = values in this interval not displayed.   CD4 T cell Abs: 90 CD4% helper T-cell: 8 HIV 1 RNA quant: 3460 HCV antibody: >11.0 HCV genotype: 1A HCV RNA quant: 6,610,000 HBsAg: Positive HBV DNA PCR: 810 QuantiFERON gold: Negative Cryptococcal antigen: Positive, antigen titers 640 CSF labs: Xanthochromatic, protein 65, glucose 39, no growth at 3 days, Direct bilirubin: 16.0 Ammonia: 72 Urinalysis: Unremarkable I-STAT lactic acid: 1.42 Folate: 221 (WNL) B12: 753 (WNL) Ferritin: 497 (H) Magnesium: 1.8 INR: 1.31  Imaging/Diagnostic Tests: EKG 12-Lead: NSR without ST changes or t-wave abnormalities or heart blocks, with QTc 435  CT HEAD WITHOUT CONTRAST (09/30/2018) 1. No evidence of acute intracranial abnormality. 2. Interval removal of ventriculostomy catheter with decreased distention of the frontal horns following resolution of pneumocephalus. Otherwise unchanged mild ventriculomegaly. 3. Areas of encephalomalacia in both cerebral and cerebellar hemispheres corresponding to insults on a 2018 brain MRI.  CT ABDOMEN AND PELVIS WITH CONTRAST (09/28/2018) 1. Mild circumferential wall thickening noted in the right colon. Imaging features suspicious for infectious/inflammatory colitis. 2. Otherwise unremarkable study.  Leeroy Bock, DO 10/13/2018, 8:50 AM PGY-1, [facility] Family Medicine FPTS Intern pager: [phone], text pages welcome

## 2018-10-14 LAB — CBC
HCT: 23.5 % — ABNORMAL LOW (ref 39.0–52.0)
Hemoglobin: 8.4 g/dL — ABNORMAL LOW (ref 13.0–17.0)
MCH: 33.3 pg (ref 26.0–34.0)
MCHC: 35.7 g/dL (ref 30.0–36.0)
MCV: 93.3 fL (ref 80.0–100.0)
NRBC: 0 % (ref 0.0–0.2)
Platelets: 240 10*3/uL (ref 150–400)
RBC: 2.52 MIL/uL — ABNORMAL LOW (ref 4.22–5.81)
RDW: 14.9 % (ref 11.5–15.5)
WBC: 6.4 10*3/uL (ref 4.0–10.5)

## 2018-10-14 LAB — BASIC METABOLIC PANEL
ANION GAP: 4 — AB (ref 5–15)
BUN: 12 mg/dL (ref 6–20)
CALCIUM: 8.5 mg/dL — AB (ref 8.9–10.3)
CO2: 22 mmol/L (ref 22–32)
CREATININE: 1.11 mg/dL (ref 0.61–1.24)
Chloride: 114 mmol/L — ABNORMAL HIGH (ref 98–111)
GFR calc Af Amer: >60 mL/min (ref >60)
GFR calc non Af Amer: >60 mL/min (ref >60)
GLUCOSE: 88 mg/dL (ref 70–99)
Potassium: 3.8 mmol/L (ref 3.5–5.1)
Sodium: 140 mmol/L (ref 135–145)

## 2018-10-14 LAB — MAGNESIUM: Magnesium: 1.7 mg/dL (ref 1.7–2.4)

## 2018-10-14 MED ORDER — SULFAMETHOXAZOLE-TRIMETHOPRIM 800-160 MG PO TABS: 1.0000 | ORAL_TABLET | Freq: Every day | ORAL | 0 refills | Status: DC

## 2018-10-14 MED ORDER — FLUCONAZOLE 200 MG PO TABS: 400.0000 mg | ORAL_TABLET | Freq: Every day | ORAL | 0 refills | Status: DC

## 2018-10-14 MED ORDER — ADULT MULTIVITAMIN W/MINERALS CH
1.0000 | ORAL_TABLET | Freq: Every day | ORAL | Status: DC
  Administered 2018-10-14: 1 via ORAL
  Filled 2018-10-14: qty 1

## 2018-10-14 MED ORDER — EMTRICITAB-RILPIVIR-TENOFOV AF 200-25-25 MG PO TABS: 1.0000 | ORAL_TABLET | Freq: Every day | ORAL | 0 refills | Status: DC

## 2018-10-14 MED ORDER — DOLUTEGRAVIR SODIUM 50 MG PO TABS: 50.0000 mg | ORAL_TABLET | Freq: Every day | ORAL | 0 refills | Status: DC

## 2018-10-14 MED FILL — ODEFSEY 200-25-25 MG TABS: 200-25-25 | 30 days supply | Qty: 30 | Fill #0

## 2018-10-14 MED FILL — SULFAMETHOXAZOLE-TMP DS TAB: 800-160 | 30 days supply | Qty: 30 | Fill #0

## 2018-10-14 MED FILL — FLUCONAZOLE 200 MG TABLET: 200 | 30 days supply | Qty: 60 | Fill #0

## 2018-10-14 MED FILL — TIVICAY 50 MG TABLET: 50 | 30 days supply | Qty: 30 | Fill #0

## 2018-10-14 NOTE — Progress Notes (Signed)
Infectious Diseases Pharmacy Progress Note  I spoke with Justin Ball regarding his medications prior to discharge, which have been delivered to his room from the [facility] pharmacy. We reviewed each medication and its indication, as well as how it should be taken. I stressed the importance of adherence and warned of recurrent infection and readmission if he does not take his medications appropriately. I provided a medication calendar to help with his adherence and also recommended using a smartphone app for the same purpose. I encouraged him to call the [facility] office if he ever has any trouble getting his medications. [name] confirmed understanding. He has a follow up appointment with Dr. [name] on 10/29.  [name], PharmD PGY2 Infectious Diseases Pharmacy Resident Phone: [phone]

## 2018-10-14 NOTE — Progress Notes (Addendum)
[facility]    Date of Admission:  09/28/2018   Total days of antibiotics 15        Day 2 fluc  ID: Justin Ball is a 27 y.o. 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 Chlorhexidine Gluconate Cloth  6 each Topical Q0600  . dolutegravir  50 mg Oral Daily  . emtricitabine-rilpivir-tenofovir AF  1 tablet Oral Q breakfast  . feeding supplement (ENSURE ENLIVE)  237 mL Oral BID BM  . fluconazole  400 mg Oral q1800  . lactulose  10 g Oral Daily  . LORazepam  0.5 mg Oral Once  . multivitamin with minerals  1 tablet Oral Daily  . potassium chloride  40 mEq Oral BID  . prochlorperazine  10 mg Intravenous Daily  . QUEtiapine  25 mg Oral QHS  . sodium chloride flush  10-40 mL Intracatheter Q12H  . sulfamethoxazole-trimethoprim  1 tablet Oral Daily    Objective: Vital signs in last 24 hours: Temp:  [98.3 F (36.8 C)-98.6 F (37 C)] 98.3 F (36.8 C) (10/21 0537) Pulse Rate:  [88-98] 98 (10/21 0946) Resp:  [16-18] 18 (10/21 0946) BP: (89-109)/(50-73) 106/61 (10/21 0946) SpO2:  [96 %-100 %] 99 % (10/21 0946)  Physical Exam  Constitutional: He is oriented to person, place, and time. Sleepy, but arousable. He appears well-developed and well-nourished. No distress.  HENT:  Mouth/Throat: Oropharynx is clear and moist. No oropharyngeal exudate.  Cardiovascular:  Normal rate, regular rhythm and normal heart sounds. Exam reveals no gallop and no friction rub.  No murmur heard.  Pulmonary/Chest: Effort normal and breath sounds normal. No respiratory distress. He has no wheezes.  Abdominal: Soft. Bowel sounds are normal. He exhibits no distension. There is no tenderness.  Lymphadenopathy:  He has no cervical adenopathy.  Neurological: He is alert and oriented to person, place, and time.  Skin: Skin is warm and dry. No rash noted. No erythema.  Psychiatric: He has a normal mood and affect. His behavior is normal.     Lab Results Recent Labs    10/13/18 0446 10/14/18 0338  WBC 5.6 6.4  HGB 8.1* 8.4*  HCT 22.5* 23.5*  NA 140 140  K 3.4* 3.8  CL 114* 114*  CO2 22 22  BUN 13 12  CREATININE 1.12 1.11   Liver Panel Recent Labs    10/12/18 0330  BILITOT 5.9*  BILIDIR 3.1*  IBILI 2.8*    Microbiology: No growth on csf culture Studies/Results: No results found.   Assessment/Plan: Completed 2 wk of induction with flucytosine and amphotericin for CM  CM= for cryptococcal meningitis, recommend to discharge on maintance of fluconazole 400mg  daily (will see back in clinic to ensure he has refills)  HIV disease =continue on tivicay plus odefsey to take with a meal. He is  able to understands that he needs to take with food to improve absorption  oi proph = continue bactrim ds 1 daily x 3 months likely  He has appt with Zenaida Niece dam on 10/29. From the ID perspective, he is ok for discharge. Id pharmacist has sent in his rx to transitional pharmacy  [facility] for Infectious Diseases Cell: [phone] Pager: [phone]  10/14/2018, 4:51 PM

## 2018-10-14 NOTE — Progress Notes (Signed)
Patient discharged home,picked up by [name].Patient's belongings,cellphone and charger given to patient. Discharge papers explained and given to [name],including new medications. Family verbalized understanding. [name] also made aware of MD appointments. Questions answered. Justin Ball, [name], Charity fundraiser

## 2018-10-14 NOTE — Progress Notes (Signed)
Nutrition Follow-up  DOCUMENTATION CODES:   Not applicable  INTERVENTION:   - Continue Ensure Enlive BID (each provides 350 kcal and 20 g protein) - Add MVI with minerals  NUTRITION DIAGNOSIS:   Increased nutrient needs related to acute illness as evidenced by estimated needs.  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 Presented 10/5 with malaise and jaundice. Admitted for acute on chronic Hep B and colitis.   Pt resting at time of follow up visit. Would like to continue receiving Ensure Enlive supplements. States his appetite is improving.   Diet Order:   Diet Order            Diet regular Room service appropriate? Yes; Fluid consistency: Thin  Diet effective now              EDUCATION NEEDS:  Education needs have been addressed  Skin:  Skin Assessment: Reviewed RN Assessment  Last BM:  10/06/18  Height:  Ht Readings from Last 1 Encounters:  09/28/18 6\' 1"  (1.854 m)    Weight:  Wt Readings from Last 1 Encounters:  10/08/18 86.6 kg    Ideal Body Weight:  83.6 kg  BMI:  Body mass index is 25.2 kg/m.  Estimated Nutritional Needs:   Kcal:  2300-2500 kcal  Protein:  115-130 grams  Fluid:  >/= 2 L/day  Jolaine Artist, MS, RDN, LDN On-call pager: [phone]

## 2018-10-22 ENCOUNTER — Ambulatory Visit: Payer: Medicaid Other | Admitting: Infectious Disease

## 2018-10-22 LAB — CULTURE, FUNGUS WITHOUT SMEAR

## 2018-10-31 LAB — FUNGUS CULTURE WITH STAIN

## 2018-10-31 LAB — FUNGAL ORGANISM REFLEX

## 2018-10-31 LAB — FUNGUS CULTURE RESULT

## 2018-11-12 ENCOUNTER — Telehealth: Payer: Self-pay | Admitting: *Deleted

## 2018-11-12 NOTE — Telephone Encounter (Signed)
-----   Message from Blanchard KelchStephanie N Dixon, NP sent at 11/12/2018  9:29 AM EST ----- Please give Raef a call to have him come for follow up appointment with Dr. Daiva EvesVan Dam

## 2018-11-12 NOTE — Telephone Encounter (Signed)
Referral received and call made today. VM left stating I would like to check in with Justin Ball to see how he is doing. I would also like to arrange a visit with our clinic. Waiting on a return call.

## 2018-11-12 NOTE — Telephone Encounter (Signed)
Per message from HaskinsStephanie called the patient and had to leave a message for him or someone to call the office for a visit.

## 2018-11-14 ENCOUNTER — Ambulatory Visit (INDEPENDENT_AMBULATORY_CARE_PROVIDER_SITE_OTHER): Payer: Medicaid Other | Admitting: Internal Medicine

## 2018-11-14 ENCOUNTER — Encounter: Payer: Self-pay | Admitting: Internal Medicine

## 2018-11-14 DIAGNOSIS — Z8661 Personal history of infections of the central nervous system: Secondary | ICD-10-CM

## 2018-11-14 DIAGNOSIS — B180 Chronic viral hepatitis B with delta-agent: Secondary | ICD-10-CM

## 2018-11-14 DIAGNOSIS — B1710 Acute hepatitis C without hepatic coma: Secondary | ICD-10-CM | POA: Diagnosis not present

## 2018-11-14 DIAGNOSIS — B20 Human immunodeficiency virus [HIV] disease: Secondary | ICD-10-CM | POA: Diagnosis present

## 2018-11-14 DIAGNOSIS — L98491 Non-pressure chronic ulcer of skin of other sites limited to breakdown of skin: Secondary | ICD-10-CM | POA: Diagnosis not present

## 2018-11-14 DIAGNOSIS — Z8619 Personal history of other infectious and parasitic diseases: Secondary | ICD-10-CM

## 2018-11-14 DIAGNOSIS — L98499 Non-pressure chronic ulcer of skin of other sites with unspecified severity: Secondary | ICD-10-CM | POA: Insufficient documentation

## 2018-11-14 MED ORDER — VALACYCLOVIR HCL 1 G PO TABS: 1000.0000 mg | ORAL_TABLET | Freq: Three times a day (TID) | ORAL | 0 refills | Status: DC

## 2018-11-14 NOTE — Progress Notes (Signed)
Patient Active Problem List   Diagnosis Date Noted  . History of cryptococcal meningitis     Priority: Medium  . 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 Perianal ulcer (HCC) 11/14/2018  . Colitis   . Intractable vomiting   . Hepatitis C virus infection without hepatic coma   . 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 Acute renal failure (HCC)   . Non compliance w medication regimen   . Prolonged Q-T interval on ECG   . Serum total bilirubin elevated   . Acute hepatitis   . Jaundice 09/28/2018  . Loose stools 01/30/2018  . Intraabdominal fluid collection   . Fecal incontinence 11/19/2017  . Seizure disorder (HCC) 09/12/2017  . 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 History of syphilis   . Tobacco abuse   . Cryptococcal meningoencephalitis (HCC)   . Generalized headaches 05/20/2017  . 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 Hypokalemia 09/12/2016  . Anemia 09/12/2016  . 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 Bipolar disorder (HCC) 04/08/2013    Patient's Medications  New Prescriptions   VALACYCLOVIR (VALTREX) 1000 MG TABLET    Take 1 tablet (1,000 mg total) by mouth 3 (three) times daily.  Previous Medications   ACETAMINOPHEN (TYLENOL) 325 MG TABLET    Take 2 tablets (650 mg total) by mouth every 4 (four) hours as needed for mild pain (temp > 101.5).   DOLUTEGRAVIR (TIVICAY) 50 MG TABLET    Take 1 tablet (50 mg total) by mouth daily.   EMTRICITABINE-RILPIVIR-TENOFOVIR AF (ODEFSEY) 200-25-25 MG TABS TABLET    Take 1 tablet by mouth daily with breakfast.   FLUCONAZOLE (DIFLUCAN) 200 MG TABLET    Take 2 tablets (400 mg total) by mouth daily at 6 PM.   LEVETIRACETAM (KEPPRA) 1000 MG TABLET    Take 1 tablet (1,000 mg total) by mouth 2 (two) times daily.   QUETIAPINE (SEROQUEL) 25 MG TABLET    Take 1 tablet (25 mg total) by mouth 2 (two) times daily as needed (agitation).   SULFAMETHOXAZOLE-TRIMETHOPRIM (BACTRIM DS,SEPTRA DS) 800-160 MG TABLET    Take 1 tablet by mouth daily.  Modified Medications   No medications on file  Discontinued Medications   No medications on file    Subjective: Justin Ball is with his grandmother for his hospital follow-up visit.  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  He developed a shunt infection earlier this year and had the shunt removed.  His meningitis left him with cognitive impairment.  He also has chronic hepatitis B.  He had been living with his grandmother who supervises his medication but he "fell in with the wrong Internet crowd" recently and left her home for about 1 month.  He did not have any of his medications during that time.  He was admitted to the hospital last month with headache, blurred vision and acute hepatitis with jaundice.  He has hepatitis C viral load was 6,610,000.  His genotype was 1A.  His lumbar puncture was unremarkable but his cryptococcal antigen was positive.  He received another induction course of therapy with amphotericin and 5 flucytosine before being converted back to oral fluconazole.  His CD4 count had fallen to 90 and his HIV viral load was 3460.  His  hepatitis B viral load was 810.  He restarted Odefsey and Tivicay.  Since discharge she has been back home with his grandmother and she states that he has taken all of his medication correctly every day.  He has difficulty telling me whether or not he is feeling any better.  He has been having perirectal pain and wonders if he has a recurrence of his perirectal abscess.  Review of Systems: Review of Systems  Unable to perform ROS: Mental acuity    Past Medical History:  Diagnosis Date  . 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 Anxiety   . 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 Bipolar 1 disorder (HCC)   . 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 Cognitive communication deficit    working w/ therapy  . 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 Depression   . 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 Fecal incontinence 11/19/2017  . Foley catheter in place   . 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 History of scabies 2013  . History of syphilis   . 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 Neurogenic bowel    wears depends  . Neuromuscular disorder (HCC)   . 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 Seizure disorder [facility](HCC)    neurologist-  dr Marjory Liespenumalli ([facility])--caused by cryptococcal meningitis/ multiple infarctions--  no seizure since hospitalization May 2018  . Status epilepticus (HCC)   . Visuospatial deficit     Social History   Tobacco Use  . Smoking status: Current Every Day Smoker    Packs/day: 0.50    Years: 5.00    Pack years: 2.50    Types: Cigarettes  . Smokeless tobacco: Never Used  . Tobacco comment: cutting back  Substance Use Topics  . Alcohol use: No  . Drug use: No    Family History  Problem Relation Age of Onset  . Hypertension Other     Allergies  Allergen Reactions  . Acyclovir And Related Itching    Health Maintenance  Topic Date Due  . URINE MICROALBUMIN  02/27/2001  . TETANUS/TDAP  02/27/2010  . INFLUENZA VACCINE  07/25/2018  . HIV Screening  Completed    Objective:  Vitals:   11/14/18 1025  BP:  114/83  Pulse: 79  Temp: 98.6 F (37 C)  Weight: 193 lb (87.5 kg)   Body mass index is 25.46 kg/m.  Physical Exam  Constitutional: He is oriented to person, place, and time.  He is alert and in no distress.  He has difficulty answering questions and his grandmother frequently interjects  HENT:  Mouth/Throat: No oropharyngeal exudate.  Eyes: Conjunctivae are normal.  Neck: Neck supple.  Cardiovascular: Normal rate, regular rhythm and normal heart sounds.  Pulmonary/Chest: Effort normal and breath sounds normal.  Abdominal: Soft. There is no tenderness.  Genitourinary:  Genitourinary Comments: He has multiple small superficial ulcers around his rectum.  There is no  induration or erythema present.  Neurological: He is alert and oriented to person, place, and time.  Skin: No rash noted.    Lab Results Lab Results  Component Value Date   WBC 6.4 10/14/2018   HGB 8.4 (L) 10/14/2018   HCT 23.5 (L) 10/14/2018   MCV 93.3 10/14/2018   PLT 240 10/14/2018    Lab Results  Component Value Date   CREATININE 1.11 10/14/2018   BUN 12 10/14/2018   NA 140 10/14/2018   K 3.8 10/14/2018   CL 114 (H) 10/14/2018   CO2 22 10/14/2018    Lab Results  Component Value Date   ALT 69 (H) 10/11/2018   AST 163 (H) 10/11/2018   ALKPHOS 164 (H) 10/11/2018   BILITOT 5.9 (H) 10/12/2018    Lab Results  Component Value Date   CHOL 125 09/28/2016   HDL 31 (L) 09/28/2016   LDLCALC 70 09/28/2016   TRIG 110 01/17/2018   CHOLHDL 4.0 09/28/2016   Lab Results  Component Value Date   LABRPR NON-REACTIVE 03/20/2018   RPRTITER 1:64 (A) 10/26/2009   HIV 1 RNA Quant (copies/mL)  Date Value  09/29/2018 3,460  04/18/2018 35 (H)  03/20/2018 63 (H)   CD4 T Cell Abs (/uL)  Date Value  09/28/2018 90 (L)  04/18/2018 240 (L)  03/20/2018 230 (L)   Hepatitis B viral load 810 international units/mL Hepatitis C viral load 6,610,000 international units/mL genotype 1a Cryptococcal antigen positive at  640  Problem List Items Addressed This Visit      Medium   History of cryptococcal meningitis    Given his low CD4 count and positive cryptococcal antigen I will have him continue fluconazole for now.      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  He will get repeat blood work today and follow-up in 2 weeks.  He will continue prophylactic trimethoprim sulfamethoxazole.      Relevant Medications   valACYclovir (VALTREX) 1000 MG tablet   Other Relevant Orders   T-helper cell (CD4)- ([facility] clinic only)   HIV-1 RNA quant-no reflex-bld   CBC   Comprehensive metabolic panel   RPR   Hepatitis C RNA quantitative   Cryptococcal Ag, Ltx Scr Rflx Titer     Unprioritized   Perianal ulcer (HCC)    I will treat him empirically for possible genital herpes.      Hepatitis C virus infection without hepatic coma    He had been hepatitis C antibody negative in the past.  His acute hepatitis and jaundice may be due to hepatitis C.  I will repeat his liver enzymes and viral load and get him back in 2 weeks.      Relevant Medications   valACYclovir (VALTREX) 1000 MG tablet   Chronic viral hepatitis B without coma and with delta agent (HCC)    He had low-level reactivation of his hepatitis B off of his medications.  He will continue [facility] for now.  He will need repeat viral load testing in the next few months.      Relevant Medications   valACYclovir (VALTREX) 1000 MG tablet        Cliffton Asters, MD [facility] for Infectious Disease [facility] [phone] pager   [phone] cell 11/14/2018, 12:06 PM

## 2018-11-14 NOTE — Assessment & Plan Note (Signed)
He had low-level reactivation of his hepatitis B off of his medications.  [facility] for [facility].  He will need repeat viral load testing in the next few months.

## 2018-11-14 NOTE — Assessment & Plan Note (Signed)
He had been hepatitis C antibody negative in the past.  His acute hepatitis and jaundice may be due to hepatitis C.  I will repeat his liver enzymes and viral load and get him back in 2 weeks.

## 2018-11-14 NOTE — Assessment & Plan Note (Signed)
We will have him continue Odefsey and Tivicay.  He will get repeat blood work today and follow-up in 2 weeks.  He will continue prophylactic trimethoprim sulfamethoxazole.

## 2018-11-14 NOTE — Assessment & Plan Note (Signed)
I will treat him empirically for possible genital herpes.

## 2018-11-14 NOTE — Assessment & Plan Note (Signed)
Given his low CD4 count and positive cryptococcal antigen I will have him continue fluconazole for now.

## 2018-11-15 LAB — T-HELPER CELL (CD4) - (RCID CLINIC ONLY)
CD4 T CELL ABS: 200 /uL — AB (ref 400–2700)
CD4 T CELL HELPER: 13 % — AB (ref 33–55)

## 2018-11-18 ENCOUNTER — Telehealth: Payer: Self-pay | Admitting: *Deleted

## 2018-11-18 NOTE — Telephone Encounter (Signed)
Received call reporting critical lab value for cryptococcal antigen 1:4096.

## 2018-11-20 ENCOUNTER — Telehealth: Payer: Self-pay | Admitting: Infectious Disease

## 2018-11-20 LAB — CRYPTOCOCCAL AG, LTX SCR RFLX TITER
CRYPTOCOCCAL AG SCREEN: DETECTED — AB
Cryptococcal Ag Titer: 1:4096 {titer}
MICRO NUMBER:: 91404937
SPECIMEN QUALITY: ADEQUATE

## 2018-11-20 LAB — CBC
HCT: 37.5 % — ABNORMAL LOW (ref 38.5–50.0)
HEMOGLOBIN: 13 g/dL — AB (ref 13.2–17.1)
MCH: 33.5 pg — AB (ref 27.0–33.0)
MCHC: 34.7 g/dL (ref 32.0–36.0)
MCV: 96.6 fL (ref 80.0–100.0)
MPV: 10.2 fL (ref 7.5–12.5)
PLATELETS: 217 10*3/uL (ref 140–400)
RBC: 3.88 10*6/uL — AB (ref 4.20–5.80)
RDW: 11.9 % (ref 11.0–15.0)
WBC: 3.3 10*3/uL — ABNORMAL LOW (ref 3.8–10.8)

## 2018-11-20 LAB — COMPREHENSIVE METABOLIC PANEL
AG RATIO: 0.5 (calc) — AB (ref 1.0–2.5)
ALBUMIN MSPROF: 3 g/dL — AB (ref 3.6–5.1)
ALKALINE PHOSPHATASE (APISO): 248 U/L — AB (ref 40–115)
ALT: 64 U/L — ABNORMAL HIGH (ref 9–46)
AST: 126 U/L — AB (ref 10–40)
BILIRUBIN TOTAL: 2.9 mg/dL — AB (ref 0.2–1.2)
BUN: 8 mg/dL (ref 7–25)
CHLORIDE: 107 mmol/L (ref 98–110)
CO2: 23 mmol/L (ref 20–32)
CREATININE: 1.14 mg/dL (ref 0.60–1.35)
Calcium: 8.7 mg/dL (ref 8.6–10.3)
GLOBULIN: 5.8 g/dL — AB (ref 1.9–3.7)
Glucose, Bld: 95 mg/dL (ref 65–99)
POTASSIUM: 3.1 mmol/L — AB (ref 3.5–5.3)
Sodium: 140 mmol/L (ref 135–146)
Total Protein: 8.8 g/dL — ABNORMAL HIGH (ref 6.1–8.1)

## 2018-11-20 LAB — HEPATITIS C RNA QUANTITATIVE
HCV QUANT LOG: 6.34 {Log_IU}/mL — AB
HCV RNA, PCR, QN: 2210000 IU/mL — ABNORMAL HIGH

## 2018-11-20 LAB — HIV-1 RNA QUANT-NO REFLEX-BLD
HIV 1 RNA Quant: 44 copies/mL — ABNORMAL HIGH
HIV-1 RNA QUANT, LOG: 1.64 {Log_copies}/mL — AB

## 2018-11-20 LAB — RPR: RPR Ser Ql: NONREACTIVE

## 2018-11-20 NOTE — Telephone Encounter (Signed)
[place] Cryptococcal ag is back at > 1:4k  HE NEEDS TO BE BROUGHT IN TO ER FOR LUMBAR PUNCTURE AND REINITIATION OF AMPHOTERICIIN  I THINK WE NEED TO CLEARLY ONLY DO CRYPTO AG IN HOUSE AS THIS LAB WAS DRAWN ON November 21ST AND I AM SEEING IT A WEEK LATER  I HAVE messages on [name]'s voicemail for the phone he has listed as well as with his grandmother.

## 2018-11-25 NOTE — Telephone Encounter (Signed)
A very good as I mentioned I am just always nervous about [place] he is actually taking his fluconazole and is doing well we can sit on it but certainly bothers me.  I think in future we should not send cryptococcal antigens out to [facility] for 2 reasons 1 critical values that are so high should come back the same day in fact within hours and secondly if there is a problem with lab variation that this extreme then we certainly should not be using [facility] for cryptococcal antigens

## 2018-11-25 NOTE — Telephone Encounter (Addendum)
Spoke directly with Dr Orvan Falconerampbell when result came in.  He is following.

## 2018-11-26 ENCOUNTER — Telehealth: Payer: Self-pay | Admitting: *Deleted

## 2018-11-26 NOTE — Telephone Encounter (Signed)
RN left message with patient's grandmother letting her know that Dr Orvan Falconerampbell had been monitoring Justin Ball's labs, that he was comfortable with [facility] following up as scheduled on 12/5.  RN asked Kathie RhodesBetty to call back and let us know if there is any issue with this appointment date/time. Andree CossHowell, Colson Barco M, RN

## 2018-11-28 ENCOUNTER — Ambulatory Visit (INDEPENDENT_AMBULATORY_CARE_PROVIDER_SITE_OTHER): Payer: Medicaid Other | Admitting: Internal Medicine

## 2018-11-28 ENCOUNTER — Encounter: Payer: Self-pay | Admitting: Internal Medicine

## 2018-11-28 DIAGNOSIS — L98491 Non-pressure chronic ulcer of skin of other sites limited to breakdown of skin: Secondary | ICD-10-CM

## 2018-11-28 DIAGNOSIS — B1710 Acute hepatitis C without hepatic coma: Secondary | ICD-10-CM

## 2018-11-28 DIAGNOSIS — B20 Human immunodeficiency virus [HIV] disease: Secondary | ICD-10-CM | POA: Diagnosis not present

## 2018-11-28 DIAGNOSIS — B451 Cerebral cryptococcosis: Secondary | ICD-10-CM

## 2018-11-28 DIAGNOSIS — B180 Chronic viral hepatitis B with delta-agent: Secondary | ICD-10-CM | POA: Diagnosis not present

## 2018-11-28 NOTE — Progress Notes (Signed)
Patient Active Problem List   Diagnosis Date Noted  . Hepatitis C virus infection without hepatic coma     Priority: High  . 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 Cryptococcal meningoencephalitis (HCC)     Priority: High  . 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 Bipolar disorder (HCC) 04/08/2013    Priority: Medium  . Perianal ulcer (HCC) 11/14/2018  . Acute renal failure (HCC)   . Non compliance w medication regimen   . Prolonged Q-T interval on ECG   . Intraabdominal fluid collection   . Seizure disorder (HCC) 09/12/2017  . 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 History of syphilis   . Tobacco abuse   . 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 Anemia 09/12/2016  . 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   DOLUTEGRAVIR (TIVICAY) 50 MG TABLET    Take 1 tablet (50 mg total) by mouth daily.   EMTRICITABINE-RILPIVIR-TENOFOVIR AF (ODEFSEY) 200-25-25 MG TABS TABLET    Take 1 tablet by mouth daily with breakfast.   FLUCONAZOLE (DIFLUCAN) 200 MG TABLET    Take 2 tablets (400 mg total) by mouth daily at 6 PM.   LEVETIRACETAM (KEPPRA) 1000 MG TABLET    Take 1 tablet (1,000 mg total) by mouth 2 (two) times daily.   QUETIAPINE (SEROQUEL) 25 MG TABLET    Take 1 tablet (25 mg total) by mouth 2 (two) times daily as needed (agitation).   SULFAMETHOXAZOLE-TRIMETHOPRIM (BACTRIM DS,SEPTRA DS) 800-160 MG TABLET    Take 1 tablet by mouth daily.   VALACYCLOVIR (VALTREX) 1000 MG TABLET    Take 1 tablet (1,000 mg total) by mouth 3 (three) times daily.  Modified Medications   No medications on file  Discontinued Medications   No medications on file    Subjective: Justin Ball is with his  grandmother for his 1 week follow-up visit.  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  He developed a shunt infection earlier this year and had the shunt removed.  His meningitis left him with cognitive impairment.  He also has chronic hepatitis B.  He had been living with his grandmother who supervises his medication but he "fell in with the wrong Internet crowd" recently and left her home for about 1 month.  He did not have any of his medications during that time.  He was admitted to the hospital in October with headache, blurred vision and acute hepatitis with jaundice.  He has hepatitis C viral load was 6,610,000.  His genotype was 1A.  His lumbar puncture was unremarkable but his cryptococcal antigen was positive.  He received another induction course of therapy with amphotericin and 5 flucytosine before being converted back to oral fluconazole.  His CD4 count had fallen to 90 and his HIV viral load was 3460.  His hepatitis B viral load was 810.  He restarted Odefsey and Tivicay.  Since discharge she has been back home with his grandmother and she states that he has taken all of his medication  correctly every day.  He states that he is feeling much better.  He has not had any headache, fever, chills or sweats.  His perirectal discomfort resolved shortly after starting valacyclovir last week after his visit with me.  Review of Systems: Review of Systems  Unable to perform ROS: Mental acuity    Past Medical History:  Diagnosis Date  . 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) infectious disease--- dr Zenaida Niece dam   CD4=50 on 08-02-2017  . Anxiety   . 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 Bipolar 1 disorder (HCC)   . 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 Cognitive communication  deficit    working w/ therapy  . 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 Depression   . 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 Fecal incontinence 11/19/2017  . Foley catheter in place   . 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 History of scabies 2013  . History of syphilis   . 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 Neurogenic bowel    wears depends  . Neuromuscular disorder (HCC)   . 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 Seizure disorder [facility])    neurologist-  dr Marjory Lies ([facility])--caused by cryptococcal meningitis/ multiple infarctions--  no seizure since hospitalization May 2018  . Status epilepticus (HCC)   . Visuospatial deficit     Social History   Tobacco Use  . Smoking status: Current Every Day Smoker    Packs/day: 0.50    Years: 5.00    Pack years: 2.50    Types: Cigarettes  . Smokeless tobacco: Never Used  . Tobacco comment: cutting back  Substance Use Topics  . Alcohol use: No  . Drug use: No    Family History  Problem Relation Age of Onset  . Hypertension Other     Allergies  Allergen Reactions  . Acyclovir And Related Itching    Health Maintenance  Topic Date Due  . URINE MICROALBUMIN  02/27/2001  . TETANUS/TDAP  02/27/2010  . INFLUENZA VACCINE  07/25/2018  . HIV Screening  Completed    Objective:  Vitals:   11/28/18 1551  BP: 120/76  Pulse: 81  Temp: 98.5 F (36.9 C)  Weight: 192 lb (87.1 kg)   Body mass index is 25.33 kg/m.  Physical Exam  Constitutional: He is oriented to person, place, and time.  He is more alert and talkative  than 1 week ago.  He appears quite comfortable.  He defers to his grandmother to give most of the talking.  HENT:  Mouth/Throat: No oropharyngeal exudate.  Eyes: Conjunctivae are normal.  Neck: Neck supple.  Cardiovascular: Normal rate, regular rhythm and normal heart sounds.  Pulmonary/Chest: Effort normal and breath sounds normal.  Abdominal: Soft. There is no tenderness.  Neurological: He is alert and oriented to person, place, and time.  Skin: No rash noted.    Lab Results Lab Results  Component Value Date   WBC 3.3 (L) 11/14/2018   HGB 13.0 (L)  11/14/2018   HCT 37.5 (L) 11/14/2018   MCV 96.6 11/14/2018   PLT 217 11/14/2018    Lab Results  Component Value Date   CREATININE 1.14 11/14/2018   BUN 8 11/14/2018   NA 140 11/14/2018   K 3.1 (L) 11/14/2018   CL 107 11/14/2018   CO2 23 11/14/2018    Lab Results  Component Value Date   ALT 64 (H) 11/14/2018   AST 126 (H) 11/14/2018   ALKPHOS 164 (H) 10/11/2018   BILITOT 2.9 (H) 11/14/2018    Lab Results  Component Value Date   CHOL 125 09/28/2016   HDL 31 (L) 09/28/2016   LDLCALC 70 09/28/2016   TRIG 110 01/17/2018   CHOLHDL 4.0 09/28/2016   Lab Results  Component Value Date   LABRPR NON-REACTIVE 11/14/2018   RPRTITER 1:64 (A) 10/26/2009   HIV 1 RNA Quant (copies/mL)  Date Value  11/14/2018 44 (H)  09/29/2018 3,460  04/18/2018 35 (H)   CD4 T Cell Abs (/uL)  Date Value  11/14/2018 200 (L)  09/28/2018 90 (L)  04/18/2018 240 (L)   Hepatitis B viral load 810 international units/mL Hepatitis C viral load 6,610,000 international units/mL genotype 1a Cryptococcal antigen positive at 640  Problem List Items Addressed This Visit      High   Hepatitis C virus infection without hepatic coma    He appears to have acute hepatitis C genotype Ia.  His recent abdominal CT scan did not show any hepatic masses or cirrhosis.  I will order a fibro-test today and have him come back to see Dr. Daiva EvesVan Dam and pharmacy in 1  month.      Relevant Orders   Liver Fibrosis, FibroTest-ActiTest   Cryptococcal meningoencephalitis (HCC)    His recent cryptococcal antigen was quite elevated but it has been consistently positive over the past year.  He recently underwent a second course of induction therapy with IV amphotericin and 5 flucytosine.  He is taking his fluconazole and is clinically improved.  His VP shunt was removed last January.  He will continue fluconazole now and follow-up in 1 month.      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  He will need to have his hepatitis B studies repeated in the next few months but I expect him to suppress now that he is back on Odefsey.      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  He will continue medication under his grandmother supervision and follow-up in 1 month.        Unprioritized   Perianal ulcer (HCC)    He has perianal ulcers have improved promptly after starting empiric valacyclovir.  I suspect that he has genital herpes.           Justin AstersJohn Reagan Klemz, MD [facility] [phone] pager   [phone] cell 11/28/2018, 4:45 PM

## 2018-11-28 NOTE — Assessment & Plan Note (Signed)
He has perianal ulcers have improved promptly after starting empiric valacyclovir.  I suspect that he has genital herpes.

## 2018-11-28 NOTE — Assessment & Plan Note (Signed)
His HIV infection is coming back under good control after restarting medications recently.  He will continue medication under his grandmother supervision and follow-up in 1 month.

## 2018-11-28 NOTE — Assessment & Plan Note (Signed)
His hepatitis B reactivated slightly when he was off of his medications and his viral load was up to 810.  He will need to have his hepatitis B studies repeated in the next few months but I expect him to suppress now that he is back on Odefsey.

## 2018-11-28 NOTE — Assessment & Plan Note (Signed)
He appears to have acute hepatitis C genotype Ia.  His recent abdominal CT scan did not show any hepatic masses or cirrhosis.  I will order a fibro-test today and have him come back to see Dr. Daiva EvesVan Dam and pharmacy in 1 month.

## 2018-11-28 NOTE — Assessment & Plan Note (Signed)
His recent cryptococcal antigen was quite elevated but it has been consistently positive over the past year.  He recently underwent a second course of induction therapy with IV amphotericin and 5 flucytosine.  He is taking his fluconazole and is clinically improved.  His VP shunt was removed last January.  He will continue fluconazole now and follow-up in 1 month.

## 2018-12-03 LAB — LIVER FIBROSIS, FIBROTEST-ACTITEST
ALT: 87 U/L — AB (ref 9–46)
APOLIPOPROTEIN A1: 102 mg/dL (ref 94–176)
Alpha-2-Macroglobulin: 232 mg/dL (ref 106–279)
BILIRUBIN: 2.1 mg/dL — AB (ref 0.2–1.2)
Fibrosis Score: 0.95
GGT: 694 U/L — AB (ref 3–70)
HAPTOGLOBIN: 10 mg/dL — AB (ref 43–212)
Necroinflammat ACT Score: 0.74
Reference ID: 2760258

## 2018-12-06 ENCOUNTER — Ambulatory Visit: Payer: Medicaid Other | Admitting: Infectious Disease

## 2019-01-06 ENCOUNTER — Telehealth: Payer: Self-pay | Admitting: Pharmacist

## 2019-01-06 NOTE — Telephone Encounter (Signed)
How could he not need refills? Is he filling elsewhere? I know he was in the hospital for some period of time

## 2019-01-06 NOTE — Telephone Encounter (Signed)
Patient last filled Odefsey and Tivicay at [facility] on 10/21. Justin Ball is having a hard time getting in touch with patient and when they do, he requests that they talk to his grandmother who states that he doesn't need refills. FYI.  Patient has an appointment with Dr. [name] on 1/28 so hopefully he will come and we can sort this out at that time.

## 2019-01-07 NOTE — Telephone Encounter (Signed)
He isn't filling anywhere else that I am aware of. And I'm not sure how he could not need refills either.  He goes back and forth on allowing us and not allowing us to speak to his grandmother too.

## 2019-01-07 NOTE — Telephone Encounter (Signed)
I think somewhere along the line he needs to be clearly deemed incompetent and on countershock that was never done certainly if he is hospitalized again we need to have psychiatry see him as soon as possible and declare him incompetent so we do not have to go through him I certainly do not think he is competent at all

## 2019-01-08 ENCOUNTER — Emergency Department (HOSPITAL_COMMUNITY)
Admission: EM | Admit: 2019-01-08 | Discharge: 2019-01-08 | Disposition: A | Discharge status: Home / Self Care | Payer: Medicaid Other | Attending: Emergency Medicine | Admitting: Emergency Medicine

## 2019-01-08 ENCOUNTER — Other Ambulatory Visit: Payer: Self-pay

## 2019-01-08 ENCOUNTER — Emergency Department (HOSPITAL_COMMUNITY): Payer: Medicaid Other

## 2019-01-08 ENCOUNTER — Encounter (HOSPITAL_COMMUNITY): Payer: Self-pay | Admitting: *Deleted

## 2019-01-08 ENCOUNTER — Other Ambulatory Visit (HOSPITAL_COMMUNITY): Payer: Medicaid Other

## 2019-01-08 ENCOUNTER — Inpatient Hospital Stay (HOSPITAL_COMMUNITY)
Admission: EM | Admit: 2019-01-08 | Discharge: 2019-01-11 | DRG: 977 | Disposition: A | Discharge status: Home / Self Care | Payer: Medicaid Other | Attending: Family Medicine | Admitting: Family Medicine

## 2019-01-08 DIAGNOSIS — N319 Neuromuscular dysfunction of bladder, unspecified: Secondary | ICD-10-CM | POA: Diagnosis present

## 2019-01-08 DIAGNOSIS — B1920 Unspecified viral hepatitis C without hepatic coma: Secondary | ICD-10-CM

## 2019-01-08 DIAGNOSIS — N179 Acute kidney failure, unspecified: Secondary | ICD-10-CM | POA: Diagnosis present

## 2019-01-08 DIAGNOSIS — B182 Chronic viral hepatitis C: Secondary | ICD-10-CM | POA: Diagnosis not present

## 2019-01-08 DIAGNOSIS — K8020 Calculus of gallbladder without cholecystitis without obstruction: Secondary | ICD-10-CM | POA: Diagnosis present

## 2019-01-08 DIAGNOSIS — Z8661 Personal history of infections of the central nervous system: Secondary | ICD-10-CM

## 2019-01-08 DIAGNOSIS — Z8249 Family history of ischemic heart disease and other diseases of the circulatory system: Secondary | ICD-10-CM

## 2019-01-08 DIAGNOSIS — Z5321 Procedure and treatment not carried out due to patient leaving prior to being seen by health care provider: Secondary | ICD-10-CM | POA: Diagnosis not present

## 2019-01-08 DIAGNOSIS — F419 Anxiety disorder, unspecified: Secondary | ICD-10-CM

## 2019-01-08 DIAGNOSIS — R1110 Vomiting, unspecified: Secondary | ICD-10-CM | POA: Diagnosis not present

## 2019-01-08 DIAGNOSIS — Z8619 Personal history of other infectious and parasitic diseases: Secondary | ICD-10-CM

## 2019-01-08 DIAGNOSIS — B180 Chronic viral hepatitis B with delta-agent: Secondary | ICD-10-CM | POA: Diagnosis present

## 2019-01-08 DIAGNOSIS — R17 Unspecified jaundice: Secondary | ICD-10-CM | POA: Diagnosis present

## 2019-01-08 DIAGNOSIS — F319 Bipolar disorder, unspecified: Secondary | ICD-10-CM | POA: Diagnosis present

## 2019-01-08 DIAGNOSIS — R9431 Abnormal electrocardiogram [ECG] [EKG]: Secondary | ICD-10-CM

## 2019-01-08 DIAGNOSIS — B451 Cerebral cryptococcosis: Secondary | ICD-10-CM | POA: Diagnosis not present

## 2019-01-08 DIAGNOSIS — G40909 Epilepsy, unspecified, not intractable, without status epilepticus: Secondary | ICD-10-CM

## 2019-01-08 DIAGNOSIS — Z8659 Personal history of other mental and behavioral disorders: Secondary | ICD-10-CM

## 2019-01-08 DIAGNOSIS — B181 Chronic viral hepatitis B without delta-agent: Secondary | ICD-10-CM | POA: Diagnosis present

## 2019-01-08 DIAGNOSIS — B20 Human immunodeficiency virus [HIV] disease: Secondary | ICD-10-CM | POA: Diagnosis not present

## 2019-01-08 DIAGNOSIS — Z79899 Other long term (current) drug therapy: Secondary | ICD-10-CM

## 2019-01-08 DIAGNOSIS — L299 Pruritus, unspecified: Secondary | ICD-10-CM | POA: Diagnosis present

## 2019-01-08 DIAGNOSIS — E876 Hypokalemia: Secondary | ICD-10-CM

## 2019-01-08 DIAGNOSIS — F17210 Nicotine dependence, cigarettes, uncomplicated: Secondary | ICD-10-CM | POA: Diagnosis present

## 2019-01-08 DIAGNOSIS — R51 Headache: Secondary | ICD-10-CM

## 2019-01-08 DIAGNOSIS — I4581 Long QT syndrome: Secondary | ICD-10-CM | POA: Diagnosis present

## 2019-01-08 DIAGNOSIS — I69311 Memory deficit following cerebral infarction: Secondary | ICD-10-CM

## 2019-01-08 DIAGNOSIS — Z888 Allergy status to other drugs, medicaments and biological substances status: Secondary | ICD-10-CM

## 2019-01-08 DIAGNOSIS — G039 Meningitis, unspecified: Secondary | ICD-10-CM | POA: Diagnosis present

## 2019-01-08 DIAGNOSIS — R1011 Right upper quadrant pain: Secondary | ICD-10-CM

## 2019-01-08 DIAGNOSIS — Z9114 Patient's other noncompliance with medication regimen: Secondary | ICD-10-CM

## 2019-01-08 DIAGNOSIS — R519 Headache, unspecified: Secondary | ICD-10-CM

## 2019-01-08 LAB — CSF CELL COUNT WITH DIFFERENTIAL
RBC Count, CSF: 1 /mm3 — ABNORMAL HIGH
RBC Count, CSF: 6 /mm3 — ABNORMAL HIGH
Tube #: 1
Tube #: 4
WBC, CSF: 0 /mm3 (ref 0–5)
WBC, CSF: 2 /mm3 (ref 0–5)

## 2019-01-08 LAB — COMPREHENSIVE METABOLIC PANEL
ALT: 142 U/L — ABNORMAL HIGH (ref 0–44)
ALT: 118 U/L — ABNORMAL HIGH (ref 0–44)
AST: 604 U/L — ABNORMAL HIGH (ref 15–41)
AST: 475 U/L — ABNORMAL HIGH (ref 15–41)
Albumin: 2.5 g/dL — ABNORMAL LOW (ref 3.5–5.0)
Albumin: 1.9 g/dL — ABNORMAL LOW (ref 3.5–5.0)
Alkaline Phosphatase: 227 U/L — ABNORMAL HIGH (ref 38–126)
Alkaline Phosphatase: 184 U/L — ABNORMAL HIGH (ref 38–126)
Anion gap: 10 (ref 5–15)
Anion gap: 7 (ref 5–15)
BUN: 10 mg/dL (ref 6–20)
BUN: 8 mg/dL (ref 6–20)
CO2: 24 mmol/L (ref 22–32)
CO2: 19 mmol/L — ABNORMAL LOW (ref 22–32)
CREATININE: 1.26 mg/dL — AB (ref 0.61–1.24)
Calcium: 8.9 mg/dL (ref 8.9–10.3)
Calcium: 8.3 mg/dL — ABNORMAL LOW (ref 8.9–10.3)
Chloride: 101 mmol/L (ref 98–111)
Chloride: 110 mmol/L (ref 98–111)
Creatinine, Ser: 1.45 mg/dL — ABNORMAL HIGH (ref 0.61–1.24)
GFR calc Af Amer: >60 mL/min (ref >60)
GFR calc Af Amer: >60 mL/min (ref >60)
GFR calc non Af Amer: >60 mL/min (ref >60)
GLUCOSE: 102 mg/dL — AB (ref 70–99)
Glucose, Bld: 99 mg/dL (ref 70–99)
Potassium: 2.6 mmol/L — CL (ref 3.5–5.1)
Potassium: 3.1 mmol/L — ABNORMAL LOW (ref 3.5–5.1)
Sodium: 135 mmol/L (ref 135–145)
Sodium: 136 mmol/L (ref 135–145)
Total Bilirubin: 23.4 mg/dL (ref 0.3–1.2)
Total Bilirubin: 21.4 mg/dL (ref 0.3–1.2)
Total Protein: 10.7 g/dL — ABNORMAL HIGH (ref 6.5–8.1)
Total Protein: 9.1 g/dL — ABNORMAL HIGH (ref 6.5–8.1)

## 2019-01-08 LAB — URINALYSIS, ROUTINE W REFLEX MICROSCOPIC
Glucose, UA: NEGATIVE mg/dL
Hgb urine dipstick: NEGATIVE
Ketones, ur: NEGATIVE mg/dL
LEUKOCYTES UA: NEGATIVE
NITRITE: NEGATIVE
Protein, ur: NEGATIVE mg/dL
Specific Gravity, Urine: 1.027 (ref 1.005–1.030)
pH: 7 (ref 5.0–8.0)

## 2019-01-08 LAB — BASIC METABOLIC PANEL
Anion gap: 7 (ref 5–15)
BUN: 7 mg/dL (ref 6–20)
CO2: 21 mmol/L — ABNORMAL LOW (ref 22–32)
Calcium: 8.4 mg/dL — ABNORMAL LOW (ref 8.9–10.3)
Chloride: 108 mmol/L (ref 98–111)
Creatinine, Ser: 1.55 mg/dL — ABNORMAL HIGH (ref 0.61–1.24)
GFR calc Af Amer: >60 mL/min (ref >60)
Glucose, Bld: 102 mg/dL — ABNORMAL HIGH (ref 70–99)
Potassium: 2.7 mmol/L — CL (ref 3.5–5.1)
Sodium: 136 mmol/L (ref 135–145)

## 2019-01-08 LAB — CBC
HCT: 42.5 % (ref 39.0–52.0)
Hemoglobin: 14.7 g/dL (ref 13.0–17.0)
MCH: 30.2 pg (ref 26.0–34.0)
MCHC: 34.6 g/dL (ref 30.0–36.0)
MCV: 87.3 fL (ref 80.0–100.0)
Platelets: 173 10*3/uL (ref 150–400)
RBC: 4.87 MIL/uL (ref 4.22–5.81)
RDW: 16.3 % — ABNORMAL HIGH (ref 11.5–15.5)
WBC: 4.8 10*3/uL (ref 4.0–10.5)
nRBC: 0 % (ref 0.0–0.2)

## 2019-01-08 LAB — CRYPTOCOCCAL ANTIGEN
CRYPTOCOCCAL AG TITER: 640 — AB
Crypto Ag: POSITIVE — AB

## 2019-01-08 LAB — CRYPTOCOCCAL ANTIGEN, CSF
Crypto Ag: POSITIVE — AB
Cryptococcal Ag Titer: 10 — AB

## 2019-01-08 LAB — PROTEIN, CSF: Total  Protein, CSF: 42 mg/dL (ref 15–45)

## 2019-01-08 LAB — AMMONIA: Ammonia: 117 umol/L — ABNORMAL HIGH (ref 9–35)

## 2019-01-08 LAB — APTT: aPTT: 43 seconds — ABNORMAL HIGH (ref 24–36)

## 2019-01-08 LAB — GLUCOSE, CSF: Glucose, CSF: 50 mg/dL (ref 40–70)

## 2019-01-08 LAB — PROTIME-INR
INR: 1.34
Prothrombin Time: 16.4 seconds — ABNORMAL HIGH (ref 11.4–15.2)

## 2019-01-08 LAB — MAGNESIUM: Magnesium: 1.9 mg/dL (ref 1.7–2.4)

## 2019-01-08 LAB — LIPASE, BLOOD: Lipase: 40 U/L (ref 11–51)

## 2019-01-08 MED ORDER — QUETIAPINE FUMARATE 25 MG PO TABS
25.0000 mg | ORAL_TABLET | Freq: Two times a day (BID) | ORAL | Status: DC | PRN
  Administered 2019-01-09 – 2019-01-11 (×2): 25 mg via ORAL
  Filled 2019-01-08 (×3): qty 1

## 2019-01-08 MED ORDER — VALACYCLOVIR HCL 500 MG PO TABS
1000.0000 mg | ORAL_TABLET | ORAL | Status: DC
  Administered 2019-01-08 – 2019-01-10 (×2): 1000 mg via ORAL
  Filled 2019-01-08 (×3): qty 2

## 2019-01-08 MED ORDER — SULFAMETHOXAZOLE-TRIMETHOPRIM 800-160 MG PO TABS
1.0000 | ORAL_TABLET | Freq: Every day | ORAL | Status: DC
  Administered 2019-01-08 – 2019-01-11 (×4): 1 via ORAL
  Filled 2019-01-08 (×4): qty 1

## 2019-01-08 MED ORDER — POLYETHYLENE GLYCOL 3350 17 G PO PACK: 17.0000 g | PACK | Freq: Every day | ORAL | Status: DC | PRN

## 2019-01-08 MED ORDER — POTASSIUM CHLORIDE 10 MEQ/100ML IV SOLN
10.0000 meq | INTRAVENOUS | Status: AC
  Administered 2019-01-08 (×3): 10 meq via INTRAVENOUS
  Filled 2019-01-08 (×4): qty 100

## 2019-01-08 MED ORDER — ACETAMINOPHEN 325 MG PO TABS
650.0000 mg | ORAL_TABLET | Freq: Four times a day (QID) | ORAL | Status: DC | PRN
  Administered 2019-01-09 – 2019-01-11 (×3): 650 mg via ORAL
  Filled 2019-01-08 (×3): qty 2

## 2019-01-08 MED ORDER — ACETAMINOPHEN 650 MG RE SUPP: 650.0000 mg | Freq: Four times a day (QID) | RECTAL | Status: DC | PRN

## 2019-01-08 MED ORDER — LIDOCAINE-EPINEPHRINE (PF) 2 %-1:200000 IJ SOLN
10.0000 mL | Freq: Once | INTRAMUSCULAR | Status: AC
  Administered 2019-01-08: 10 mL
  Filled 2019-01-08: qty 20

## 2019-01-08 MED ORDER — POTASSIUM CHLORIDE CRYS ER 20 MEQ PO TBCR
40.0000 meq | EXTENDED_RELEASE_TABLET | Freq: Two times a day (BID) | ORAL | Status: DC
  Administered 2019-01-08: 40 meq via ORAL
  Filled 2019-01-08: qty 2

## 2019-01-08 MED ORDER — IOHEXOL 300 MG/ML  SOLN
75.0000 mL | Freq: Once | INTRAMUSCULAR | Status: AC | PRN
  Administered 2019-01-08: 75 mL via INTRAVENOUS

## 2019-01-08 MED ORDER — LEVETIRACETAM 500 MG PO TABS
1000.0000 mg | ORAL_TABLET | Freq: Two times a day (BID) | ORAL | Status: DC
  Administered 2019-01-08 – 2019-01-11 (×6): 1000 mg via ORAL
  Filled 2019-01-08 (×6): qty 2

## 2019-01-08 MED ORDER — NICOTINE 14 MG/24HR TD PT24
14.0000 mg | MEDICATED_PATCH | Freq: Every day | TRANSDERMAL | Status: DC
  Administered 2019-01-08 – 2019-01-11 (×2): 14 mg via TRANSDERMAL
  Filled 2019-01-08 (×4): qty 1

## 2019-01-08 MED ORDER — HEPARIN SODIUM (PORCINE) 5000 UNIT/ML IJ SOLN
5000.0000 [IU] | Freq: Three times a day (TID) | INTRAMUSCULAR | Status: DC
  Filled 2019-01-08 (×2): qty 1

## 2019-01-08 MED ORDER — SODIUM CHLORIDE 0.9 % IV BOLUS
1000.0000 mL | Freq: Once | INTRAVENOUS | Status: AC
  Administered 2019-01-08: 1000 mL via INTRAVENOUS

## 2019-01-08 NOTE — ED Notes (Signed)
Patient transported to [facility] 

## 2019-01-08 NOTE — ED Triage Notes (Signed)
Pt reports that his eye are yellow and that he has been vomiting today. Pt denies any pain.

## 2019-01-08 NOTE — H&P (Addendum)
Justin Ball Admission History and Physical Service Pager: [phone]  Patient name: Justin Ball Medical record number: [id number] Date of birth: 25-Mar-1991 Age: 28 y.o. Gender: male  Primary Care Provider: Patient, No Pcp Per Consultants: ID, GI  Code Status: Full  Chief Complaint: Vomiting and Headache  Assessment and Plan: Justin Ball is a 28 y.o. male presenting with 4 days of nausea, vomiting, and headache. 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  Headache Unclear etiology at this time. Patient presenting with headache x4 days and vomiting x 4 days without fever. Along with headache patient also has vision changes stating he has had blurry vision since headache began. Patient with history of cryptococcal menignoencephalitis with resultant seizures. Has not taken any medications x4 days due to vomiting. 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 Recent admission in October 2019 at which time ID was consulted for cryptococcal infection. Per last ID note from 11/2018 patient had recent cryptococcal antigen which was elevated and recommended to continue fluconazole with 1 month follow up.  Per multiple notes in patient's chart, patient likely noncompliant on medications.  It appears patient has not requested refills in some time.  ID consulted in ED and recommended lumbar puncture and repeat serum cryptococcal antigen with CD4 count and HIV and Hep B and C viral loads. CT head with no acute findings, although mild cerebral and cerebellar atrophy. CXR negative for edema or consolidation. EKG significant for NSR with prolonged QT of 600, which is chronic for patient. Cryptococcal antigen positive, titer of 640,  CBC WNL with no leukocytosis, Spinal fluid with normal  protein and glucose, and gram stain with no organisms. CSF cell count significant for rare lymphs, 50 glucose, 2 WBC and 42 protein. UA negative. Blood culture, fungal culture, CSF culture, HSV CSF PCR pending. Vitals on admission were stable and he was afebrile. Clinically, patient was well appearing. Neuro exam benign, A&Ox3, PERRLA.  Patient was able to get up on his own in bed without difficulty. Negative Brudzinski's sign. Given patient's noncompliance to medications from recent vomiting, suspect exacerbation of chronic cryptococcal infection as presentation is similar to previous infection.  Patient appears to be at his mental baseline per notes in chart.  No family at bedside so unable to obtain their insight. - Admit to med-surg telemetry, attending Dr. Gwendlyn Deutscher - ID consulted, appreciate recommendations: LP, hold odefsey and tivicay, hold antifungal therapy, serum cryptococcal antigen, CD4 count, HIV viral load, Hep B/C viral loads - s/p NS bolus in ED - Vitals per floor  - continuous cardiac monitoring - Holding ART meds per ID recommendations  - Follow up Hepatitis panel / Blood cx / fungal cx / CSF cx, HSV CSF PCR /  HIV-1 RNA pCR, CD4 count, UDS, ammonia  - Follow up AM CBC, CMP - Psychiatry consulted for capacity, recommended by Dr. Tommy Medal on 1/14 - Tylenol PRN - PT/OT consulted   Jaundice with associated nausea and vomiting  Chronic Hepatitis B and C: Endorsed non-bloody, bilious vomiting x 4 days without fever or diarrhea. RUQ ultrasound significant for biliary sludge and mildly thickened gallbladder without stones and some concern for cholecystitis.  Recommended for further nuclear medicine hepatobiliary imaging study to assess her cystic duct patency.  Denies any abdominal pain on exam however labs significant for elevated total bilirubin 23.4, AST  604, ALT 142, Alk phos 227, and low albumin 2.5. Lipase WNL. Hepatitis panel pending, Hep B DNA and HCV RNA pending. Fibrotest by Dr.  Delora Fuel 11/28/2018 significant for severe fibrosis. Per chart review, had a Hep B reactivation in Dec 2019 with viral load of 810 due to noncompliance of medications at that time. On exam, patient has obvious scleral icterus and jaundice but no asterixis.  No abdominal pain on exam.  Differential includes acute on chronic hepatitis as patient has been off his medications vs biliary in origin, although clinically does not appear to have acute cholecystitis.  - Follow up Hepatitis panel, Hep B DNA and HCV RNA, Ammonia, AM CBC, CMP - Consult GI, consider MRCP to further evaluate for cholecystitis  - Have attempted to page GI multiple times, will continue to attempt - ID consulted, will follow up recs  Hypokalemia; K+ 2.6 on admission. Likely 2/2 to 4 days of vomiting. S/p 30 mEq IV K+ in ED.  - Repeat BMP at 1800 - We will replete with 40 mg K-Dur x2 - Continue to replete as needed  Prolonged QT: QTc 600 with NSR on EKG in the ED. This appears chronic for the patient.  EKG on 09/28/2018 showing QTc of 604.  Did not seem to resolve as EKG from 09/30/2018 showing QTC of 435.  However, K+ 2.6 on admission. Per ID, will hold antiretrovirals and antifungals as Odefsey and Fluconazole can exacerbate QT prolongation.  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  Patient states he takes his medications, however, just has a lot saved up at home is why he does not need refills as often.  [facility] Last appointment on [facility]2018. Home meds include Odefsey QD, Tivicay QD and prophylactic Bactrim QD. 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 He followed up with Dr. CMegan Salonwith ID on 11/28/2018 and was continued on Fluconazole and Keppra and was to follow up on 1/28 with Dr. VTommy Medal - Plan as above - hold fluconazole given prolonged QTc, appreciate ID recs - Continue home Keppra  Bipolar disorder  Agitation : On home Seroquel 251mBID PRN. Concern for lack of capacity per Dr. VaTommy MedalWill consult psychiatry.  - Seroquel PRN for agitation - Psych consulted, will follow up  Tobacco Use -nicotine patch   FEN/GI: Heart healthy diet, Miralax PRN Prophylaxis: Heparin SQ  Disposition: Telemetry for workup and pending IR recs   History of Present Illness:  Justin CRYSTALs a [age].o. male presenting with nausea, and vomiting as well as jaundice x 4 days.   Patient reports he first noticed yellowing of eyes x4 days. Patient also endorses bilious emesis without blood for past 4 days as well. Patient states otherwise he has been at baseline. Denies fever, chills, diarrhea or abdominal pain. Patient also endorses pounding intermittent headache in the frontal region of his head. Headaches also started 4 days ago. Has tried tylenol which helped some. Does endorse photophobia. No phonophobia. States some blurry vision  as well. No tremors or trouble walking. Endorses associated dizziness as well.   Per chart review patient seems to be noncompliant with medications. Per patient he has taken all his medications and has a large supply at home which is why he has not needed refills. Gets prescriptions mailed to him. Has not taken any meds x4 days as he has thrown everything up. Per patient saw Dr. Megan Salon  2 weeks ago for ID follow up, however per chart review he was last seen on 11/28/2018.   In ED, Infectious Disease was consulted and recommended lumbar puncture given headaches. CBC  WNL. CMP remarkable for K 2.6, Cr 1.26, elevated bilirubin 23.4, AST 604, ALT 142, Albumin 2.5, Alk phos 227. EKG with prolonged QT of 600 with NSR, RUQ with biliary sludge with concern for cholecystitis, CXR negative for cardiopulmonary disease. CT head with and without contrast with no acute finding but Cerebellar more than cerebral atrophy. Cryptococcal antigen elevated at 10>640. Hepatitis panels, Hep B DNA, Hep C RNA, and blood/fungal cultures pending. Patient was admitted for further evaluation and management.  Review Of Systems: Per HPI with the following additions:   Review of Systems  Constitutional: Positive for chills. Negative for fever.  Eyes: Positive for blurred vision.  Respiratory: Negative for cough and shortness of breath.   Cardiovascular: Negative for chest pain.  Gastrointestinal: Positive for nausea and vomiting. Negative for abdominal pain and diarrhea.  Genitourinary: Negative for dysuria and frequency.  Neurological: Positive for headaches. Negative for tremors.    Patient Active Problem List   Diagnosis Date Noted  . Perianal ulcer ([place]) 11/14/2018  . Hepatitis C virus infection without hepatic coma   . Acute renal failure ([place])   . Non compliance w medication regimen   . Prolonged Q-T interval on ECG   . Intraabdominal fluid collection   . Seizure disorder ([place]) 09/12/2017  . 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 Chronic viral hepatitis B without coma and with delta agent ([place])   . History of syphilis   . Tobacco abuse   . Cryptococcal meningoencephalitis ([place])   . 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 Anemia 09/12/2016  . 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 Bipolar disorder ([place]) 04/08/2013    Past Medical History: Past Medical History:  Diagnosis Date  . 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 Anxiety   . 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 Bipolar 1 disorder ([place])   . Chronic viral hepatitis B without coma and with delta agent ([place])   . Cognitive communication deficit    working w/ therapy  . Cryptococcal meningoencephalitis ([place]) 36/64/4034   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 and  near heriation of brain (required VP shunt)--- residual CNS damage  . Depression   . 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 Fecal incontinence 11/19/2017  . Foley catheter in place   . 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 History of scabies 2013  . History of syphilis   . 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  Neurogenic bowel    wears depends  . Neuromuscular disorder ([place])   . 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 Seizure disorder [facility])    neurologist-  dr Leta Baptist ([facility])--caused by cryptococcal meningitis/ multiple infarctions--  no seizure since hospitalization May 2018  . Status epilepticus ([place])   . Visuospatial deficit     Past Surgical History: Past Surgical History:  Procedure Laterality Date  . CYSTOSCOPY N/A 10/10/2017   Procedure: CYSTOSCOPY;  Surgeon: Alexis Frock, MD;  Location[facility];  Service: Urology;  Laterality: N/A;  . EXAMINATION UNDER ANESTHESIA N/A 12/28/2014   Procedure: EXAM UNDER ANESTHESIA;  Surgeon: Michael Boston, MD;  Location: [facility];  Service: General;  Laterality: N/A;  . FLOOR OF MOUTH BIOPSY N/A 09/16/2016   Procedure: BIOPSY OF ORAL ABCESS;  Surgeon: Leta Baptist, MD;  Location: [place];  Service: ENT;  Laterality: N/A;  . INCISION AND DRAINAGE ABSCESS N/A 09/16/2016   Procedure: INCISION AND DRAINAGE  ABSCESS;  Surgeon: Leta Baptist, MD;  Location: [place];  Service: ENT;  Laterality: N/A;  . [place]  09/ 03/ 2011   dr toth  . INCISION AND DRAINAGE PERIRECTAL ABSCESS N/A 12/28/2014   Procedure: IRRIGATION AND DEBRIDEMENT PERIRECTAL ABSCESS;  Surgeon: Michael Boston, MD;  Location: [facility];  Service: General;  Laterality: N/A;  Fistula repair and ablation  . INSERTION OF SUPRAPUBIC CATHETER N/A 10/10/2017   Procedure: INSERTION OF SUPRAPUBIC CATHETER;  Surgeon: Alexis Frock, MD;  [facility];  Service: Urology;  Laterality: N/A;  . LAPAROSCOPIC REVISION VENTRICULAR-PERITONEAL (V-P) SHUNT N/A 06/12/2017   Procedure: LAPAROSCOPIC REVISION VENTRICULAR-PERITONEAL (V-P) SHUNT;  Surgeon: Kinsinger, Arta Bruce, MD;  Location: [place];  Service: General;  Laterality: N/A;  . SHUNT REMOVAL N/A 01/01/2018   Procedure: REMOVAL OF V-P SHUNT, PLACEMENT OF EVD;  Surgeon: Consuella Lose, MD;  Location: [place];  Service: Neurosurgery;  Laterality: N/A;  . TRANSTHORACIC ECHOCARDIOGRAM  06/02/2017   EF 65-70%/  trivial PR  . VENTRICULOPERITONEAL SHUNT N/A 06/12/2017   Procedure: SHUNT INSERTION VENTRICULAR-PERITONEAL;  Surgeon: Consuella Lose, MD;  Location: [place];  Service: Neurosurgery;  Laterality: N/A;    Social History: Social History   Tobacco Use  . Smoking status: Current Every Day Smoker    Packs/day: 0.50    Years: 5.00    Pack years: 2.50    Types: Cigarettes  . Smokeless tobacco: Never Used  . Tobacco comment: cutting back  Substance Use Topics  . Alcohol use: Yes  . Drug use: No   Additional social history: lives with grandmother. Endorses alcohol use every other day. Drinks 2 "big" beers when he drinks. No drug use. Smokes 1ppd.  Please also refer to relevant sections of EMR.  Family History: Family History  Problem Relation Age of Onset  . Hypertension Other    Allergies and Medications: Allergies  Allergen Reactions  .  Acyclovir And Related Itching   No current facility-administered medications on file prior to encounter.    Current Outpatient Medications on File Prior to Encounter  Medication Sig Dispense Refill  . acetaminophen (TYLENOL) 325 MG tablet Take 2 tablets (650 mg total) by mouth every 4 (four) hours as needed for mild pain (temp > 101.5).    Marland Kitchen dolutegravir (TIVICAY) 50 MG tablet Take 1 tablet (50 mg total) by mouth daily. 30 tablet 0  . emtricitabine-rilpivir-tenofovir AF (ODEFSEY) 200-25-25 MG TABS tablet Take 1 tablet by mouth daily with breakfast. 30 tablet 0  .  levETIRAcetam (KEPPRA) 1000 MG tablet Take 1 tablet (1,000 mg total) by mouth 2 (two) times daily. 60 tablet 5  . QUEtiapine (SEROQUEL) 25 MG tablet Take 1 tablet (25 mg total) by mouth 2 (two) times daily as needed (agitation). 60 tablet 4  . sulfamethoxazole-trimethoprim (BACTRIM DS,SEPTRA DS) 800-160 MG tablet Take 1 tablet by mouth daily. 30 tablet 0  . fluconazole (DIFLUCAN) 200 MG tablet Take 2 tablets (400 mg total) by mouth daily at 6 PM. (Patient not taking: Reported on 01/08/2019) 112 tablet 0  . valACYclovir (VALTREX) 1000 MG tablet Take 1 tablet (1,000 mg total) by mouth 3 (three) times daily. (Patient taking differently: Take 1,000 mg by mouth every Monday, Wednesday, and Friday. ) 30 tablet 0    Objective: BP (!) 115/91   Pulse 98   Temp 98.9 F (37.2 C) (Oral)   Resp 18   Ht 6' 1" (1.854 m)   Wt 81.6 kg   SpO2 100%   BMI 23.75 kg/m   Physical exam: General: lying in bed, appears uncomfortable,  in no acute distress with non-toxic appearance HEENT: normocephalic, atraumatic, moist mucous membranes, scleral icterus bilaterally, oropharynx without erythema or exudate  Neck: supple, normal ROM CV: regular rate and rhythm without murmurs, rubs, or gallops, no lower extremity edema Lungs: clear to auscultation bilaterally with normal work of breathing Abdomen: soft, non-tender, non-distended, no masses or  organomegaly palpable, normoactive bowel sounds Skin: no rashes noted, dark pigmented round lesions along UE bilaterally Extremities: warm and well perfused, normal tone MSK: ROM grossly intact, strength intact Neuro: Alert and oriented x 3, speech normal, CNII-XII intact, sensory 4/4 bilaterally UE and LE, strength 5/5 UE and LE bilaterally, Finger to nose normal, heel-to-shin normal, decreased cognitive level noted on exam  Labs and Imaging: CBC BMET  Recent Labs  Lab 01/08/19 0145  WBC 4.8  HGB 14.7  HCT 42.5  PLT 173   Recent Labs  Lab 01/08/19 0145  NA 135  K 2.6*  CL 101  CO2 24  BUN 10  CREATININE 1.26*  GLUCOSE 99  CALCIUM 8.9     CMP     Component Value Date/Time   NA 135 01/08/2019 0145   K 2.6 (LL) 01/08/2019 0145   CL 101 01/08/2019 0145   CO2 24 01/08/2019 0145   GLUCOSE 99 01/08/2019 0145   BUN 10 01/08/2019 0145   CREATININE 1.26 (H) 01/08/2019 0145   CREATININE 1.14 11/14/2018 1115   CALCIUM 8.9 01/08/2019 0145   PROT 10.7 (H) 01/08/2019 0145   ALBUMIN 2.5 (L) 01/08/2019 0145   AST 604 (H) 01/08/2019 0145   ALT 142 (H) 01/08/2019 0145   ALT 87 (H) 11/28/2018 1706   ALKPHOS 227 (H) 01/08/2019 0145   BILITOT 23.4 (HH) 01/08/2019 0145   GFRNONAA >60 01/08/2019 0145   GFRNONAA 118 03/20/2018 1632   GFRAA >60 01/08/2019 0145   GFRAA 136 03/20/2018 1632   Labs: UA: negative, Cryptococcal Ag: 10>640 CSF cell count:   Tube 1: 50 glucose, 0 WBC, 42 protein, rare lymphs  Tube 4: 2 WBC, rare lymphs  Dg Chest 2 View  Result Date: 01/08/2019 CLINICAL DATA:  Vomiting and weakness EXAM: CHEST - 2 VIEW COMPARISON:  December 30, 2017 FINDINGS: There is no edema or consolidation. The heart size and pulmonary vascularity are normal. No adenopathy. No bone lesions. IMPRESSION: No edema or consolidation. Electronically Signed   By: Lowella Grip III M.D.   On: 01/08/2019 08:56   Ct Head  W Or Wo Contrast  Result Date: 01/08/2019 CLINICAL DATA:  Right-sided  headache over the last 2 days. Blurred vision. HIV positive. EXAM: CT HEAD WITHOUT AND WITH CONTRAST TECHNIQUE: Contiguous axial images were obtained from the base of the skull through the vertex without and with intravenous contrast CONTRAST:  74m OMNIPAQUE IOHEXOL 300 MG/ML  SOLN COMPARISON:  09/30/2018 FINDINGS: Brain: Mild cerebral atrophy. 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 Chronic low-density in the right frontal lobe related to the previous right ventriculostomy catheter. No sign of acute infarction, mass lesion, hemorrhage, hydrocephalus or extra-axial collection. Vascular: No abnormal vascular finding. Skull: No significant finding. Previous burr hole right frontal region. Sinuses/Orbits: Mild maxillary sinus mucosal thickening. Orbits negative. Other: None IMPRESSION: No acute finding. Cerebellar more than cerebral atrophy. Chronic low-density in the right frontal lobe related to the previous ventriculostomy catheter. Electronically Signed   By: MNelson ChimesM.D.   On: 01/08/2019 10:42   UKoreaAbdomen Limited Ruq  Result Date: 01/08/2019 CLINICAL DATA:  Right upper quadrant pain EXAM: ULTRASOUND ABDOMEN LIMITED RIGHT UPPER QUADRANT COMPARISON:  CT abdomen and pelvis September 28, 2018 FINDINGS: Gallbladder: No gallstones are appreciable on this study. There is sludge in the gallbladder. The gallbladder wall is mildly thickened. There is no pericholecystic fluid. No sonographic Murphy sign noted by sonographer. Common bile duct: Diameter: 3 mm. No intrahepatic or extrahepatic biliary duct dilatation Liver: No focal lesion identified. Within normal limits in parenchymal echogenicity. Portal vein is patent on color Doppler imaging with normal direction of blood flow towards the liver. IMPRESSION: No gallstones are evident. However, there is sludge in the gallbladder which could potentially obscure tiny gallstones. Gallbladder wall is mildly thickened. The possibility of early  cholecystitis must be of concern in this circumstance. This finding may warrant nuclear medicine hepatobiliary imaging study to assess for cystic duct patency. Study otherwise unremarkable. Electronically Signed   By: WLowella GripIII M.D.   On: 01/08/2019 08:19     MDanna Hefty DO 01/08/2019, 12:11 PM PGY-1, CM[place] GroveIntern pager: [phone] text pages welcome  FPTS Upper-Level Resident Addendum   I have independently interviewed and examined the patient. I have discussed the above with the original author and agree with their documentation. My edits for correction/addition/clarification are in blue. Please see also any attending notes.   SCaroline More DO PGY-2, CHonea PathFamily Medicine 01/08/2019 8:19 PM  FPTS Service pager: [phone](text pages welcome through [place]

## 2019-01-08 NOTE — Progress Notes (Signed)
CRITICAL VALUE ALERT  Critical Value:  Total Bilirubin 21.4   Date & Time Notied:  01/08/2019 2220  Provider Notified: Family Medicine Resident   Orders Received/Actions taken: No new orders

## 2019-01-08 NOTE — ED Triage Notes (Signed)
States his eyes are blurry and yellow , states he can't keep any food down onset 3 days ago

## 2019-01-08 NOTE — ED Provider Notes (Signed)
Physical Exam  BP 103/62   Pulse 84   Temp 98.9 F (37.2 C) (Oral)   Resp 15   Ht 6\' 1"  (1.854 m)   Wt 81.6 kg   SpO2 97%   BMI 23.75 kg/m   Physical Exam  ED Course/Procedures     .Critical Care Performed by: Derwood Kaplan, MD Authorized by: Derwood Kaplan, MD   Critical care provider statement:    Critical care time (minutes):  31   Critical care was necessary to treat or prevent imminent or life-threatening deterioration of the following conditions:  CNS failure or compromise   Critical care was time spent personally by me on the following activities:  Discussions with consultants, evaluation of patient's response to treatment, examination of patient, ordering and performing treatments and interventions, ordering and review of laboratory studies, ordering and review of radiographic studies, pulse oximetry, re-evaluation of patient's condition, obtaining history from patient or surrogate, review of old charts and development of treatment plan with patient or surrogate .Lumbar Puncture Date/Time: 01/08/2019 12:34 PM Performed by: Derwood Kaplan, MD Authorized by: Derwood Kaplan, MD   Consent:    Consent obtained:  Written   Consent given by:  Patient   Risks discussed:  Bleeding, infection, pain, repeat procedure and headache   Alternatives discussed:  No treatment Universal protocol:    Procedure explained and questions answered to patient or proxy's satisfaction: yes     Relevant documents present and verified: yes     Test results available and properly labeled: yes     Imaging studies available: yes     Required blood products, implants, devices, and special equipment available: yes     Immediately prior to procedure a time out was called: yes     Site/side marked: yes     Patient identity confirmed:  Arm band Pre-procedure details:    Procedure purpose:  Diagnostic   Preparation: Patient was prepped and draped in usual sterile fashion   Anesthesia (see MAR  for exact dosages):    Anesthesia method:  Local infiltration   Local anesthetic:  Lidocaine 2% WITH epi Procedure details:    Lumbar space:  L4-L5 interspace   Patient position:  R lateral decubitus   Needle gauge:  18   Number of attempts:  2   Opening pressure (cm H2O):  15   Fluid appearance:  Cloudy   Tubes of fluid:  4   Total volume (ml):  25 Post-procedure:    Puncture site:  Adhesive bandage applied   Patient tolerance of procedure:  Tolerated well, no immediate complications    MDM   Patient being co-managed with the APP. 28 year old male with history of HIV-AIDS with complications of cryptococcal meningitis, hepatitis C comes in with multiple complaints, including headache, abdominal discomfort and yellowing of his eyes.   Patient is a poor historian.  He reports to me that is been having headaches for the past 4 to 5 days.  The headaches are fairly constant and there is associated photophobia and some blurry vision.  He denies any neck pain or stiffness.  Patient also denies any fevers or chills.  During my evaluation patient denies any abdominal pain and on exam he did not have any abdominal tenderness.  He is clearly jaundiced.  Patient had gone to [facility] emergency room last night, but left without being seen.  At that time CMP was ordered that revealed elevated transaminases along with a bilirubin of 23.4.  Ultrasound right upper quadrant now  reveals cholelithiasis, however he has no right upper quadrant tenderness on my exam.  White count is 4.8.  Clinically it does not appear that patient has cholangitis or acute cholecystitis.  Progression of his hepatitis is suspected.  I am concerned that patient might be having cryptococcal meningitis again.  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  Patient was admitted with nonspecific complaints in October,  when he was also found to be have cryptococcal meningitis.  Patient is known to be noncompliant with his medications.  I spoke with Dr. Zenaida NieceVan dam, ID.  He recommends that we do not start antifungal at this time, and perform the LP.  He would want the intracranial pressure to be less than 20 mmHg.  LP was performed.  Opening pressure was little less than 15 mmHg.  Patient's CSF was straw colored.  ID is already seen the patient.  We will admit to medicine, who can follow-up on the CSF results and start appropriate agents for care.    ICD-10-CM   1. Jaundice R17   2. RUQ pain R10.11 US Abdomen Limited RUQ    US Abdomen Limited RUQ  3. AIDS (acquired immune deficiency syndrome) (HCC) B20   4. Worsening headaches R51         Derwood KaplanNanavati, Elexius Minar, MD 01/08/19 1236

## 2019-01-08 NOTE — Consult Note (Addendum)
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  He may be downplaying his headache to me in order to try to avoid a lumbar puncture.  I spoke with Justin Ball who said that he gave her a different story and said that his headache began when he started having nausea and vomiting recently.  There were no acute changes on head CT.  I agree with proceeding with lumbar puncture.  This morning's EKG shows some QT prolongation, possibly due to his hypokalemia.  I will hold his antiretroviral therapy and antifungal therapy since the rilpivarine component of Odefsey and fluconazole can exacerbate QT prolongation.  Plan: 1. Lumbar puncture 2. Hold Odefsey and Tivicay 3. Hold antifungal therapy for now 4. Serum cryptococcal antigen 5. CD4 count and HIV viral load 6. 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  HPI: Justin Ball is a 28 y.o. male with advanced HIV infection.  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  He had a VP shunt placed.  His meningitis caused some degree of cognitive impairment.  He also has a history of bipolar disorder.  He developed a VP shunt infection and the shunt was removed 1 year ago.  He was hospitalized again in October of last year with headache and blurred vision.  Spinal tap revealed 1 white blood cell and  slightly elevated protein.  His CSF culture was negative but his cryptococcal antigen was positive.  He underwent a second round of induction therapy with IV amphotericin and 5 flucytosine.  He started back on fluconazole along with his antiretroviral regimen of Odefsey and Tivicay.  He lives with his grandmother.  Our ID pharmacist, Justin Ball, spoke with her by phone this morning.  She told Justin Ball that they have a large supply of his medications at home and have not needed to pick up refills recently at [facility] outpatient pharmacy.  She said that she lays out his Odefsey, Tivicay, fluconazole and Keppra each morning with his breakfast.  She does not watch him take them.  She says that when she returns the medication is always gone.  Justin Ball came to the ED today stating that he has been bothered by nausea and vomiting for 4 days.  He says he has been unable to keep anything down.  He is also been concerned that his eyes are yellow again.  He says that he has blurred vision.  He tells me that he has intermittent headaches.  He says they will come and go about every hour.  He says that he does not have to take anything for his headaches.  He says that his headaches have been coming more frequently recently but are less severe.  He says that he does not understand why the doctors here want "to  put a hole in my back" (lumbar puncture).  Justin Ball also has chronic hepatitis B.  When he was hospitalized last October he had acute hepatitis and was found to have hepatitis C as well.   Review of Systems: Review of Systems  Constitutional: Positive for chills. Negative for diaphoresis and fever.  Respiratory: Negative for cough, sputum production and shortness of breath.   Gastrointestinal: Positive for nausea and vomiting. Negative for abdominal pain and diarrhea.  Musculoskeletal: Negative for neck pain.  Skin: Negative for rash.  Neurological: Positive for headaches.    Past Medical History:  Diagnosis  Date  . 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 Anxiety   . 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 Bipolar 1 disorder (HCC)   . 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 Cognitive communication deficit    working w/ therapy  . 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 Depression   . 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 Fecal incontinence 11/19/2017  . Foley catheter in place   . 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 History of scabies 2013  . History of syphilis   . 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 Neurogenic bowel    wears depends  . Neuromuscular disorder (HCC)   . 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 Seizure disorder [facility](HCC)    neurologist-  dr Marjory Liespenumalli ([facility])--caused by cryptococcal meningitis/ multiple infarctions--  no seizure since hospitalization May 2018  . Status epilepticus (HCC)   . Visuospatial deficit     Social History   Tobacco Use  . Smoking status: Current Every Day Smoker    Packs/day: 0.50    Years: 5.00    Pack years: 2.50    Types: Cigarettes  . Smokeless tobacco: Never Used  . Tobacco comment: cutting back  Substance Use Topics  . Alcohol use: Yes  . Drug use: No    Family History  Problem Relation Age of Onset  . Hypertension Other    Allergies  Allergen Reactions  . Acyclovir And Related Itching    OBJECTIVE: Blood pressure 107/64, pulse 88,  temperature 98.9 F (37.2 C), temperature source Oral, resp. rate 16, height 6\' 1"  (1.854 m), weight 81.6 kg, SpO2 97 %.  Physical Exam Constitutional:      Comments: He is resting quietly on a stretcher in the ED.  He is alert and in no distress.  Neck:     Musculoskeletal: Neck supple.  Cardiovascular:     Rate and Rhythm: Normal rate and regular rhythm.     Heart sounds: No murmur.  Pulmonary:     Effort: Pulmonary effort is normal.     Breath sounds: Normal breath sounds.  Abdominal:     General: Abdomen is flat.     Palpations: Abdomen is soft.     Tenderness: There is no abdominal tenderness.     Comments: A firm liver edge is easily  palpable 1 fingerbreadth below the right costal margin on deep inspiration.  Skin:    Findings: No rash.  Neurological:     General: No focal deficit present.  Psychiatric:        Mood and Affect: Mood normal.     Lab Results Lab Results  Component Value Date   WBC 4.8 01/08/2019   HGB 14.7 01/08/2019   HCT 42.5 01/08/2019   MCV 87.3 01/08/2019   PLT 173 01/08/2019    Lab Results  Component Value Date   CREATININE 1.26 (H) 01/08/2019   BUN 10 01/08/2019   NA 135 01/08/2019   K 2.6 (LL) 01/08/2019   CL 101 01/08/2019   CO2 24 01/08/2019    Lab Results  Component Value Date   ALT 142 (H) 01/08/2019   AST 604 (H) 01/08/2019   ALKPHOS 227 (H) 01/08/2019   BILITOT 23.4 (HH) 01/08/2019     Microbiology: No results found for this or any previous visit (from the past 240 hour(s)).  Cliffton Asters, MD [facility] [phone] pager   [phone] cell 01/08/2019, 11:30 AM

## 2019-01-08 NOTE — Progress Notes (Addendum)
Infectious Diseases Pharmacy Note  Justin Ball is a 33 yoM with PMH s/f HIV, HBV, HCV, and recurrent cryptococcal meningitis. He has been notoriously nonadherent to his medications. Oscar said that he gets his medications mailed to him from [facility] on [address]. I called this pharmacy and they have never filled a prescription for him. I also spoke with his grandmother via telephone who says that he does not fill at [facility], he fills at the [facility]. However, this pharmacy has not filled anything for him since 10/14/18 and he would have been out of medication for nearly 2 months. His grandmother says that they have a lot of medication "left over" that they have been using. She says she lays out Keppra, [place], [place], and fluconazole for him to take every morning with breakfast, and when she "comes back, they are gone." To her knowledge, he is taking them. In his admission medication reconciliation, Starr said he is no longer taking fluconazole, but claims to be taking all of his other medications, including Tivicay, Odefsey, Keppra, Seroquel, and Bactrim. However, given his presentation, I am skeptical that he has recently been taking anything.  [name], PharmD, BCPS PGY2 Infectious Diseases Pharmacy Resident Phone: [phone]

## 2019-01-08 NOTE — ED Notes (Signed)
Patient transported to X-ray 

## 2019-01-08 NOTE — ED Notes (Signed)
Patient given a sandwich.

## 2019-01-08 NOTE — Progress Notes (Signed)
CRITICAL VALUE ALERT  Critical Value:  K+ 2.7  Date & Time Notied:  01/08/2019 2150  Provider Notified: Family medicine MD  Orders Received/Actions taken: PO K+ orders from ED - will be given more in the AM

## 2019-01-08 NOTE — ED Provider Notes (Addendum)
[facility] EMERGENCY DEPARTMENT Provider Note   CSN: [id number] Arrival date & time: 01/08/19  0353     History   Chief Complaint Chief Complaint  Patient presents with  . Emesis    HPI Justin Ball is a 28 y.o. male.  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  Patient also states that it is eyes are yellow. Denies abd pain or diarrhea. Has not had BM in 2 days. Denies fevers, reports chills.   Also reports mild HA and blurred vision that started 4 days ago. No numbness/weakness or other neurologic complaints.   Patient presented to [facility] long ED prior to arrival and had labs drawn but prior to being seen.  CBC shows no leukocytosis or anemia.  CMP shows low potassium at 2.6.  Elevated creatinine at 1.26.  Total protein elevated at 10.7, low albumin.  Elevated AST/ALT at 604 and 142 respectively.  Elevated alk phos at 227 and elevated total bilirubin at 23.4.  Lipase negative.  Past Medical History:  Diagnosis Date  . 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 Anxiety   . 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 Bipolar 1 disorder ([place])   . Chronic viral hepatitis B without coma and with delta agent ([place])   . Cognitive communication deficit    working w/ therapy  . Cryptococcal meningoencephalitis ([place]) 96/22/2979   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 and  near heriation of brain (required VP shunt)--- residual CNS damage  . Depression   . 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 Fecal incontinence 11/19/2017    . Foley catheter in place   . 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 History of scabies 2013  . History of syphilis   . 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 Neurogenic bowel    wears depends  . Neuromuscular disorder ([place])   . 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 Seizure disorder [facility])    neurologist-  dr Leta Baptist ([place] [facility])--caused by cryptococcal meningitis/ multiple infarctions--  no seizure since hospitalization May 2018  . Status epilepticus ([place])   . Visuospatial deficit     Patient Active Problem List   Diagnosis Date Noted  . RUQ pain   . Jaundice 01/08/2019  . Perianal ulcer ([place]) 11/14/2018  . Hepatitis C virus infection without hepatic coma   . Acute renal failure ([place])   . Non compliance w medication regimen   . Prolonged Q-T interval on ECG   . Intraabdominal fluid collection   . Seizure disorder ([place]) 09/12/2017  . 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 Chronic viral hepatitis B without coma and with delta agent ([place])   . History of syphilis   . Tobacco abuse   . Cryptococcal meningoencephalitis ([place])   . Worsening headaches 05/20/2017  . 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  Hypokalemia 09/12/2016  . Anemia 09/12/2016  . AIDS (acquired immune deficiency syndrome) ([place]) 01/19/2015  . 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 Bipolar disorder ([place]) 04/08/2013    Past Surgical History:  Procedure Laterality Date  . CYSTOSCOPY N/A 10/10/2017   Procedure: CYSTOSCOPY;  Surgeon: Alexis Frock, MD;  Location: [facility];  Service: Urology;  Laterality: N/A;  . EXAMINATION UNDER ANESTHESIA N/A 12/28/2014   Procedure: EXAM UNDER ANESTHESIA;  Surgeon: Michael Boston, MD;  Location: [facility];   Service: General;  Laterality: N/A;  . FLOOR OF MOUTH BIOPSY N/A 09/16/2016   Procedure: BIOPSY OF ORAL ABCESS;  Surgeon: Leta Baptist, MD;  Location: [facility];  Service: ENT;  Laterality: N/A;  . INCISION AND DRAINAGE ABSCESS N/A 09/16/2016   Procedure: INCISION AND DRAINAGE ABSCESS;  Surgeon: Leta Baptist, MD;  Location: [facility];  Service: ENT;  Laterality: N/A;  . [place]  09/ 03/ 2011   dr toth  . INCISION AND DRAINAGE PERIRECTAL ABSCESS N/A 12/28/2014   Procedure: IRRIGATION AND DEBRIDEMENT PERIRECTAL ABSCESS;  Surgeon: Michael Boston, MD;  Location: [facility];  Service: General;  Laterality: N/A;  Fistula repair and ablation  . INSERTION OF SUPRAPUBIC CATHETER N/A 10/10/2017   Procedure: INSERTION OF SUPRAPUBIC CATHETER;  Surgeon: Alexis Frock, MD;  Location: [facility];  Service: Urology;  Laterality: N/A;  . LAPAROSCOPIC REVISION VENTRICULAR-PERITONEAL (V-P) SHUNT N/A 06/12/2017   Procedure: LAPAROSCOPIC REVISION VENTRICULAR-PERITONEAL (V-P) SHUNT;  Surgeon: Kinsinger, Arta Bruce, MD;  Location: [place] [facility];  Service: General;  Laterality: N/A;  . SHUNT REMOVAL N/A 01/01/2018   Procedure: REMOVAL OF V-P SHUNT, PLACEMENT OF EVD;  Surgeon: Consuella Lose, MD;  Location: [place];  Service: Neurosurgery;  Laterality: N/A;  . TRANSTHORACIC ECHOCARDIOGRAM  06/02/2017   EF 65-70%/  trivial PR  . VENTRICULOPERITONEAL SHUNT N/A 06/12/2017   Procedure: SHUNT INSERTION VENTRICULAR-PERITONEAL;  Surgeon: Consuella Lose, MD;  Location: [name];  Service: Neurosurgery;  Laterality: N/A;        Home Medications    Prior to Admission medications   Medication Sig Start Date End Date Taking? Authorizing Provider  acetaminophen (TYLENOL) 325 MG tablet Take 2 tablets (650 mg total) by mouth every 4 (four) hours as needed for mild pain (temp > 101.5). 06/28/17  Yes Michel Bickers, MD  dolutegravir (TIVICAY) 50 MG tablet Take 1 tablet (50 mg total) by mouth daily. 10/15/18  Yes  Carlyle Basques, MD  emtricitabine-rilpivir-tenofovir AF (ODEFSEY) 200-25-25 MG TABS tablet Take 1 tablet by mouth daily with breakfast. 10/15/18  Yes Carlyle Basques, MD  levETIRAcetam (KEPPRA) 1000 MG tablet Take 1 tablet (1,000 mg total) by mouth 2 (two) times daily. 09/23/18  Yes Tommy Medal, Lavell Islam, MD  QUEtiapine (SEROQUEL) 25 MG tablet Take 1 tablet (25 mg total) by mouth 2 (two) times daily as needed (agitation). 09/23/18  Yes Tommy Medal, Lavell Islam, MD  sulfamethoxazole-trimethoprim (BACTRIM DS,SEPTRA DS) 800-160 MG tablet Take 1 tablet by mouth daily. 10/15/18  Yes Carlyle Basques, MD  fluconazole (DIFLUCAN) 200 MG tablet Take 2 tablets (400 mg total) by mouth daily at 6 PM. Patient not taking: Reported on 01/08/2019 10/14/18   Carlyle Basques, MD  valACYclovir (VALTREX) 1000 MG tablet Take 1 tablet (1,000 mg total) by mouth 3 (three) times daily. Patient taking differently: Take 1,000 mg by mouth every Monday, Wednesday, and Friday.  11/14/18   Michel Bickers, MD    Family History Family History  Problem Relation Age  of Onset  . Hypertension Other     Social History Social History   Tobacco Use  . Smoking status: Current Every Day Smoker    Packs/day: 0.50    Years: 5.00    Pack years: 2.50    Types: Cigarettes  . Smokeless tobacco: Never Used  . Tobacco comment: cutting back  Substance Use Topics  . Alcohol use: Yes  . Drug use: No     Allergies   Acyclovir and related   Review of Systems Review of Systems  Constitutional: Positive for chills. Negative for fever.  HENT: Negative for congestion.   Eyes: Positive for visual disturbance.       "yellow eyes"  Respiratory: Positive for cough (mild, dry). Negative for shortness of breath.   Cardiovascular: Negative for chest pain and leg swelling.  Gastrointestinal: Positive for nausea and vomiting. Negative for abdominal pain, blood in stool, constipation and diarrhea.  Genitourinary: Negative for dysuria, frequency  and urgency.  Musculoskeletal: Negative for back pain and myalgias.  Skin: Negative for rash.  Neurological: Positive for headaches. Negative for dizziness, weakness, light-headedness and numbness.     Physical Exam Updated Vital Signs BP 114/67 (BP Location: Right Arm)   Pulse 86   Temp 98.1 F (36.7 C) (Oral)   Resp 18   Ht '6\' 1"'  (1.854 m)   Wt 80.5 kg   SpO2 97%   BMI 23.41 kg/m   Physical Exam Vitals signs and nursing note reviewed.  Constitutional:      General: He is not in acute distress.    Appearance: He is well-developed.  HENT:     Head: Normocephalic and atraumatic.     Nose: Nose normal.     Mouth/Throat:     Pharynx: No oropharyngeal exudate or posterior oropharyngeal erythema.     Comments: Dry mucous membranes Eyes:     General: Scleral icterus present.     Extraocular Movements: Extraocular movements intact.     Conjunctiva/sclera: Conjunctivae normal.     Pupils: Pupils are equal, round, and reactive to light.     Comments: +photophobia  Neck:     Musculoskeletal: Neck supple.  Cardiovascular:     Rate and Rhythm: Normal rate and regular rhythm.     Heart sounds: Normal heart sounds. No murmur.  Pulmonary:     Effort: Pulmonary effort is normal. No respiratory distress.     Breath sounds: Normal breath sounds. No wheezing or rhonchi.  Abdominal:     General: Bowel sounds are normal.     Palpations: Abdomen is soft.     Tenderness: There is abdominal tenderness (RUQ). There is no right CVA tenderness, left CVA tenderness, guarding or rebound.  Musculoskeletal:     Right lower leg: No edema.     Left lower leg: No edema.  Skin:    General: Skin is warm and dry.  Neurological:     Mental Status: He is alert.     Comments: Mental Status:  Alert, thought content appropriate, able to give a coherent history. Speech fluent without evidence of aphasia. Able to follow 2 step commands without difficulty.  Cranial Nerves:  II:   pupils equal, round,  reactive to light III,IV, VI: ptosis not present, extra-ocular motions intact bilaterally  V,VII: smile symmetric, facial light touch sensation equal VIII: hearing grossly normal to voice  X: uvula elevates symmetrically  XI: bilateral shoulder shrug symmetric and strong XII: midline tongue extension without fassiculations Motor:  Normal tone. 5/5 strength  of BUE and BLE major muscle groups including strong and equal grip strength and dorsiflexion/plantar flexion Sensory: light touch normal in all extremities.  Psychiatric:        Mood and Affect: Mood normal.      ED Treatments / Results  Labs (all labs ordered are listed, but only abnormal results are displayed) Labs Reviewed  PROTIME-INR - Abnormal; Notable for the following components:      Result Value   Prothrombin Time 16.4 (*)    All other components within normal limits  APTT - Abnormal; Notable for the following components:   aPTT 43 (*)    All other components within normal limits  URINALYSIS, ROUTINE W REFLEX MICROSCOPIC - Abnormal; Notable for the following components:   Color, Urine AMBER (*)    Bilirubin Urine MODERATE (*)    All other components within normal limits  CSF CELL COUNT WITH DIFFERENTIAL - Abnormal; Notable for the following components:   Color, CSF YELLOW (*)    RBC Count, CSF 6 (*)    All other components within normal limits  CSF CELL COUNT WITH DIFFERENTIAL - Abnormal; Notable for the following components:   Color, CSF YELLOW (*)    RBC Count, CSF 1 (*)    All other components within normal limits  CRYPTOCOCCAL ANTIGEN, CSF - Abnormal; Notable for the following components:   Crypto Ag POSITIVE (*)    Cryptococcal Ag Titer 10 (*)    All other components within normal limits  CRYPTOCOCCAL ANTIGEN - Abnormal; Notable for the following components:   Crypto Ag POSITIVE (*)    Cryptococcal Ag Titer 640 (*)    All other components within normal limits  T-HELPER CELLS (CD4) COUNT (NOT AT [facility]) -  Abnormal; Notable for the following components:   CD4 T Cell Abs 120 (*)    CD4 % Helper T Cell 10 (*)    All other components within normal limits  BASIC METABOLIC PANEL - Abnormal; Notable for the following components:   Potassium 2.7 (*)    CO2 21 (*)    Glucose, Bld 102 (*)    Creatinine, Ser 1.55 (*)    Calcium 8.4 (*)    All other components within normal limits  CBC - Abnormal; Notable for the following components:   RBC 4.06 (*)    Hemoglobin 12.5 (*)    HCT 34.3 (*)    MCHC 36.4 (*)    RDW 16.2 (*)    All other components within normal limits  AMMONIA - Abnormal; Notable for the following components:   Ammonia 117 (*)    All other components within normal limits  COMPREHENSIVE METABOLIC PANEL - Abnormal; Notable for the following components:   Potassium 3.1 (*)    CO2 19 (*)    Glucose, Bld 102 (*)    Creatinine, Ser 1.45 (*)    Calcium 8.3 (*)    Total Protein 9.1 (*)    Albumin 1.9 (*)    AST 475 (*)    ALT 118 (*)    Alkaline Phosphatase 184 (*)    Total Bilirubin 21.4 (*)    All other components within normal limits  COMPREHENSIVE METABOLIC PANEL - Abnormal; Notable for the following components:   Potassium 2.8 (*)    CO2 20 (*)    Glucose, Bld 107 (*)    Creatinine, Ser 1.33 (*)    Calcium 8.8 (*)    Total Protein 9.5 (*)    Albumin 1.9 (*)  AST 492 (*)    ALT 119 (*)    Alkaline Phosphatase 190 (*)    Total Bilirubin 22.0 (*)    All other components within normal limits  CULTURE, BLOOD (ROUTINE X 2)  CULTURE, BLOOD (ROUTINE X 2)  CSF CULTURE  FUNGUS CULTURE WITH STAIN  FUNGUS CULTURE WITH STAIN  MAGNESIUM  GLUCOSE, CSF  PROTEIN, CSF  RAPID URINE DRUG SCREEN, HOSP PERFORMED  HEPATITIS PANEL, ACUTE  HERPES SIMPLEX VIRUS(HSV) DNA BY PCR  HIV-1 RNA, PCR (GRAPH) RFX/GENO EDI  HCV RNA QUANT  HEPATITIS B DNA, ULTRAQUANTITATIVE, PCR  BASIC METABOLIC PANEL    EKG EKG Interpretation  Date/Time:  Wednesday January 08 2019 07:32:45  EST Ventricular Rate:  87 PR Interval:    QRS Duration: 86 QT Interval:  498 QTC Calculation: 600 R Axis:   43 Text Interpretation:  Sinus rhythm Probable left atrial enlargement Prolonged QT interval No acute changes Confirmed by Varney Biles [phone]) on 01/08/2019 7:37:26 AM Also confirmed by Varney Biles [phone]), editor Shon Hale [phone])  on 01/08/2019 9:16:34 AM   Radiology Dg Chest 2 View  Result Date: 01/08/2019 CLINICAL DATA:  Vomiting and weakness EXAM: CHEST - 2 VIEW COMPARISON:  December 30, 2017 FINDINGS: There is no edema or consolidation. The heart size and pulmonary vascularity are normal. No adenopathy. No bone lesions. IMPRESSION: No edema or consolidation. Electronically Signed   By: Lowella Grip III M.D.   On: 01/08/2019 08:56   Ct Head W Or Wo Contrast  Result Date: 01/08/2019 CLINICAL DATA:  Right-sided headache over the last 2 days. Blurred vision. HIV positive. EXAM: CT HEAD WITHOUT AND WITH CONTRAST TECHNIQUE: Contiguous axial images were obtained from the base of the skull through the vertex without and with intravenous contrast CONTRAST:  48m OMNIPAQUE IOHEXOL 300 MG/ML  SOLN COMPARISON:  09/30/2018 FINDINGS: Brain: Mild cerebral atrophy. 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 Chronic low-density in the right frontal lobe related to the previous right ventriculostomy catheter. No sign of acute infarction, mass lesion, hemorrhage, hydrocephalus or extra-axial collection. Vascular: No abnormal vascular finding. Skull: No significant finding. Previous burr hole right frontal region. Sinuses/Orbits: Mild maxillary sinus mucosal thickening. Orbits negative. Other: None IMPRESSION: No acute finding. Cerebellar more than cerebral atrophy. Chronic low-density in the right frontal lobe related to the previous ventriculostomy catheter. Electronically Signed   By: MNelson ChimesM.D.   On: 01/08/2019 10:42   UKoreaAbdomen Limited Ruq  Result Date:  01/08/2019 CLINICAL DATA:  Right upper quadrant pain EXAM: ULTRASOUND ABDOMEN LIMITED RIGHT UPPER QUADRANT COMPARISON:  CT abdomen and pelvis September 28, 2018 FINDINGS: Gallbladder: No gallstones are appreciable on this study. There is sludge in the gallbladder. The gallbladder wall is mildly thickened. There is no pericholecystic fluid. No sonographic Murphy sign noted by sonographer. Common bile duct: Diameter: 3 mm. No intrahepatic or extrahepatic biliary duct dilatation Liver: No focal lesion identified. Within normal limits in parenchymal echogenicity. Portal vein is patent on color Doppler imaging with normal direction of blood flow towards the liver. IMPRESSION: No gallstones are evident. However, there is sludge in the gallbladder which could potentially obscure tiny gallstones. Gallbladder wall is mildly thickened. The possibility of early cholecystitis must be of concern in this circumstance. This finding may warrant nuclear medicine hepatobiliary imaging study to assess for cystic duct patency. Study otherwise unremarkable. Electronically Signed   By: WLowella GripIII M.D.   On: 01/08/2019 08:19    Procedures Procedures (including critical care time) CRITICAL CARE  Performed by: Rodney Booze   Total critical care time: 38 minutes  Critical care time was exclusive of separately billable procedures and treating other patients.  Critical care was necessary to treat or prevent imminent or life-threatening deterioration.  Critical care was time spent personally by me on the following activities: development of treatment plan with patient and/or surrogate as well as nursing, discussions with consultants, evaluation of patient's response to treatment, examination of patient, obtaining history from patient or surrogate, ordering and performing treatments and interventions, ordering and review of laboratory studies, ordering and review of radiographic studies, pulse oximetry and re-evaluation  of patient's condition.   Medications Ordered in ED Medications  sulfamethoxazole-trimethoprim (BACTRIM DS,SEPTRA DS) 800-160 MG per tablet 1 tablet (1 tablet Oral Given 01/09/19 1001)  valACYclovir (VALTREX) tablet 1,000 mg (1,000 mg Oral Given 01/08/19 2129)  QUEtiapine (SEROQUEL) tablet 25 mg (25 mg Oral Given 01/09/19 0232)  levETIRAcetam (KEPPRA) tablet 1,000 mg (1,000 mg Oral Given 01/09/19 1001)  acetaminophen (TYLENOL) tablet 650 mg (650 mg Oral Given 01/09/19 1000)    Or  acetaminophen (TYLENOL) suppository 650 mg ( Rectal See Alternative 01/09/19 1000)  polyethylene glycol (MIRALAX / GLYCOLAX) packet 17 g (has no administration in time range)  nicotine (NICODERM CQ - dosed in mg/24 hours) patch 14 mg (14 mg Transdermal Not Given 01/09/19 1005)  lidocaine (LIDODERM) 5 % 1 patch (1 patch Transdermal Patch Applied 01/09/19 0401)  dolutegravir (TIVICAY) tablet 50 mg (50 mg Oral Given 01/09/19 1001)  magnesium sulfate IVPB 2 g 50 mL (has no administration in time range)  emtricitabine-rilpivir-tenofovir AF (ODEFSEY) 200-25-25 MG per tablet 1 tablet (has no administration in time range)  oxyCODONE (Oxy IR/ROXICODONE) immediate release tablet 2.5 mg (has no administration in time range)  sodium chloride 0.9 % bolus 1,000 mL (0 mLs Intravenous Stopped 01/08/19 1424)  potassium chloride 10 mEq in 100 mL IVPB (0 mEq Intravenous Stopped 01/08/19 0937)  lidocaine-EPINEPHrine (XYLOCAINE W/EPI) 2 %-1:200000 (PF) injection 10 mL (10 mLs Infiltration Given by Other 01/08/19 0921)  iohexol (OMNIPAQUE) 300 MG/ML solution 75 mL (75 mLs Intravenous Contrast Given 01/08/19 0945)  diphenhydrAMINE (BENADRYL) capsule 25 mg (25 mg Oral Given 01/09/19 0309)  cyclobenzaprine (FLEXERIL) tablet 5 mg (5 mg Oral Given 01/09/19 0532)  potassium chloride SA (K-DUR,KLOR-CON) CR tablet 40 mEq (40 mEq Oral Given 01/09/19 1002)     Initial Impression / Assessment and Plan / ED Course  I have reviewed the triage vital signs and  the nursing notes.  Pertinent labs & imaging results that were available during my care of the patient were reviewed by me and considered in my medical decision making (see chart for details).     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  Patient also states that it is eyes are yellow. Denies abd pain or diarrhea. Has not had BM in 2 days. Denies fevers, reports chills.   Also reports mild HA and blurred vision that started 4 days ago. No numbness/weakness or other neurologic complaints. Neuro exam is nonfocal but does have photophobia on exam.  Patient presented to [facility] ED prior to arrival and had labs drawn but prior to being seen.  Labs drawn  4 hours PTA so not repeated on visit today. CBC shows no leukocytosis or anemia.  CMP shows low potassium at 2.6, supplemented with IV potassium.  Elevated creatinine at 1.26.  Total protein elevated at 10.7, low albumin.  Elevated AST/ALT at 604 and 142 respectively.  Elevated alk phos at 227 and elevated total bilirubin at 23.4.  Lipase negative. Pt-inr normal  Dr. Kathrynn Humble spoke with ID who agrees with plan for LP. they further advised to hold antibiotics until results of LP return.   Hepatitis panel and blood cultures drawn. LP performed.  Reviewed patient's records from infectious disease.  It was noted that patient should be evaluated by psychiatry as their team feels that he may not be competent to make his own decisions. Admission team made aware.  Pt accepted to family medicine residency service.   ED Discharge Orders    None       Rodney Booze, PA-C 01/09/19 [address] Boy Delamater S,  PA-C 01/09/19 1158    Varney Biles, MD 01/10/19 1622

## 2019-01-08 NOTE — ED Triage Notes (Signed)
Pt was at [facility] but LWBS

## 2019-01-09 ENCOUNTER — Inpatient Hospital Stay (HOSPITAL_COMMUNITY): Payer: Medicaid Other

## 2019-01-09 DIAGNOSIS — R1011 Right upper quadrant pain: Secondary | ICD-10-CM | POA: Diagnosis not present

## 2019-01-09 DIAGNOSIS — B182 Chronic viral hepatitis C: Secondary | ICD-10-CM | POA: Diagnosis present

## 2019-01-09 DIAGNOSIS — R740 Nonspecific elevation of levels of transaminase and lactic acid dehydrogenase [LDH]: Secondary | ICD-10-CM

## 2019-01-09 DIAGNOSIS — Z9114 Patient's other noncompliance with medication regimen: Secondary | ICD-10-CM | POA: Diagnosis not present

## 2019-01-09 DIAGNOSIS — R112 Nausea with vomiting, unspecified: Secondary | ICD-10-CM | POA: Diagnosis present

## 2019-01-09 DIAGNOSIS — Z8249 Family history of ischemic heart disease and other diseases of the circulatory system: Secondary | ICD-10-CM | POA: Diagnosis not present

## 2019-01-09 DIAGNOSIS — I4581 Long QT syndrome: Secondary | ICD-10-CM | POA: Diagnosis present

## 2019-01-09 DIAGNOSIS — R17 Unspecified jaundice: Secondary | ICD-10-CM | POA: Diagnosis not present

## 2019-01-09 DIAGNOSIS — B451 Cerebral cryptococcosis: Secondary | ICD-10-CM | POA: Diagnosis present

## 2019-01-09 DIAGNOSIS — B20 Human immunodeficiency virus [HIV] disease: Secondary | ICD-10-CM | POA: Diagnosis present

## 2019-01-09 DIAGNOSIS — Z21 Asymptomatic human immunodeficiency virus [HIV] infection status: Secondary | ICD-10-CM | POA: Diagnosis not present

## 2019-01-09 DIAGNOSIS — R748 Abnormal levels of other serum enzymes: Secondary | ICD-10-CM

## 2019-01-09 DIAGNOSIS — K8020 Calculus of gallbladder without cholecystitis without obstruction: Secondary | ICD-10-CM | POA: Diagnosis present

## 2019-01-09 DIAGNOSIS — F419 Anxiety disorder, unspecified: Secondary | ICD-10-CM | POA: Diagnosis present

## 2019-01-09 DIAGNOSIS — Z888 Allergy status to other drugs, medicaments and biological substances status: Secondary | ICD-10-CM | POA: Diagnosis not present

## 2019-01-09 DIAGNOSIS — Z79899 Other long term (current) drug therapy: Secondary | ICD-10-CM | POA: Diagnosis not present

## 2019-01-09 DIAGNOSIS — F319 Bipolar disorder, unspecified: Secondary | ICD-10-CM | POA: Diagnosis present

## 2019-01-09 DIAGNOSIS — B180 Chronic viral hepatitis B with delta-agent: Secondary | ICD-10-CM | POA: Diagnosis not present

## 2019-01-09 DIAGNOSIS — N179 Acute kidney failure, unspecified: Secondary | ICD-10-CM | POA: Diagnosis present

## 2019-01-09 DIAGNOSIS — E876 Hypokalemia: Secondary | ICD-10-CM | POA: Diagnosis present

## 2019-01-09 DIAGNOSIS — R51 Headache: Secondary | ICD-10-CM | POA: Diagnosis not present

## 2019-01-09 DIAGNOSIS — N319 Neuromuscular dysfunction of bladder, unspecified: Secondary | ICD-10-CM | POA: Diagnosis present

## 2019-01-09 DIAGNOSIS — R509 Fever, unspecified: Secondary | ICD-10-CM

## 2019-01-09 DIAGNOSIS — F17210 Nicotine dependence, cigarettes, uncomplicated: Secondary | ICD-10-CM | POA: Diagnosis present

## 2019-01-09 DIAGNOSIS — L299 Pruritus, unspecified: Secondary | ICD-10-CM | POA: Diagnosis present

## 2019-01-09 DIAGNOSIS — Z8661 Personal history of infections of the central nervous system: Secondary | ICD-10-CM | POA: Diagnosis not present

## 2019-01-09 DIAGNOSIS — Z8619 Personal history of other infectious and parasitic diseases: Secondary | ICD-10-CM | POA: Diagnosis not present

## 2019-01-09 DIAGNOSIS — B181 Chronic viral hepatitis B without delta-agent: Secondary | ICD-10-CM | POA: Diagnosis present

## 2019-01-09 DIAGNOSIS — I69311 Memory deficit following cerebral infarction: Secondary | ICD-10-CM | POA: Diagnosis not present

## 2019-01-09 LAB — RAPID URINE DRUG SCREEN, HOSP PERFORMED
Amphetamines: NOT DETECTED
Barbiturates: NOT DETECTED
Benzodiazepines: NOT DETECTED
Cocaine: NOT DETECTED
Opiates: NOT DETECTED
Tetrahydrocannabinol: NOT DETECTED

## 2019-01-09 LAB — CBC
HCT: 34.3 % — ABNORMAL LOW (ref 39.0–52.0)
Hemoglobin: 12.5 g/dL — ABNORMAL LOW (ref 13.0–17.0)
MCH: 30.8 pg (ref 26.0–34.0)
MCHC: 36.4 g/dL — ABNORMAL HIGH (ref 30.0–36.0)
MCV: 84.5 fL (ref 80.0–100.0)
Platelets: 156 10*3/uL (ref 150–400)
RBC: 4.06 MIL/uL — AB (ref 4.22–5.81)
RDW: 16.2 % — ABNORMAL HIGH (ref 11.5–15.5)
WBC: 5 10*3/uL (ref 4.0–10.5)
nRBC: 0 % (ref 0.0–0.2)

## 2019-01-09 LAB — HEPATITIS PANEL, ACUTE
HCV Ab: >11 s/co ratio — ABNORMAL HIGH (ref 0.0–0.9)
Hep A IgM: NEGATIVE
Hep B C IgM: NEGATIVE
Hepatitis B Surface Ag: POSITIVE — AB

## 2019-01-09 LAB — COMPREHENSIVE METABOLIC PANEL
ALT: 119 U/L — AB (ref 0–44)
AST: 492 U/L — ABNORMAL HIGH (ref 15–41)
Albumin: 1.9 g/dL — ABNORMAL LOW (ref 3.5–5.0)
Alkaline Phosphatase: 190 U/L — ABNORMAL HIGH (ref 38–126)
Anion gap: 8 (ref 5–15)
BUN: 8 mg/dL (ref 6–20)
CO2: 20 mmol/L — ABNORMAL LOW (ref 22–32)
Calcium: 8.8 mg/dL — ABNORMAL LOW (ref 8.9–10.3)
Chloride: 108 mmol/L (ref 98–111)
Creatinine, Ser: 1.33 mg/dL — ABNORMAL HIGH (ref 0.61–1.24)
GFR calc Af Amer: >60 mL/min (ref >60)
GFR calc non Af Amer: >60 mL/min (ref >60)
Glucose, Bld: 107 mg/dL — ABNORMAL HIGH (ref 70–99)
Potassium: 2.8 mmol/L — ABNORMAL LOW (ref 3.5–5.1)
Sodium: 136 mmol/L (ref 135–145)
Total Bilirubin: 22 mg/dL (ref 0.3–1.2)
Total Protein: 9.5 g/dL — ABNORMAL HIGH (ref 6.5–8.1)

## 2019-01-09 LAB — BASIC METABOLIC PANEL
Anion gap: 10 (ref 5–15)
BUN: 8 mg/dL (ref 6–20)
CALCIUM: 8.6 mg/dL — AB (ref 8.9–10.3)
CO2: 20 mmol/L — ABNORMAL LOW (ref 22–32)
Chloride: 106 mmol/L (ref 98–111)
Creatinine, Ser: 1.41 mg/dL — ABNORMAL HIGH (ref 0.61–1.24)
GFR calc Af Amer: >60 mL/min (ref >60)
GFR calc non Af Amer: >60 mL/min (ref >60)
Glucose, Bld: 104 mg/dL — ABNORMAL HIGH (ref 70–99)
Potassium: 2.8 mmol/L — ABNORMAL LOW (ref 3.5–5.1)
Sodium: 136 mmol/L (ref 135–145)

## 2019-01-09 LAB — T-HELPER CELLS (CD4) COUNT (NOT AT ARMC)
CD4 % Helper T Cell: 10 % — ABNORMAL LOW (ref 33–55)
CD4 T Cell Abs: 120 /uL — ABNORMAL LOW (ref 400–2700)

## 2019-01-09 LAB — HSV DNA BY PCR (REFERENCE LAB)
HSV 1 DNA: NEGATIVE
HSV 2 DNA: NEGATIVE

## 2019-01-09 LAB — HERPES SIMPLEX VIRUS(HSV) DNA BY PCR

## 2019-01-09 MED ORDER — EMTRICITAB-RILPIVIR-TENOFOV AF 200-25-25 MG PO TABS
1.0000 | ORAL_TABLET | Freq: Every day | ORAL | Status: DC
  Administered 2019-01-09: 1 via ORAL
  Filled 2019-01-09 (×2): qty 1

## 2019-01-09 MED ORDER — DIPHENHYDRAMINE-ZINC ACETATE 2-0.1 % EX CREA
TOPICAL_CREAM | Freq: Once | CUTANEOUS | Status: AC
  Administered 2019-01-10: 04:00:00 via TOPICAL
  Filled 2019-01-09: qty 28

## 2019-01-09 MED ORDER — OXYCODONE HCL 5 MG PO TABS
2.5000 mg | ORAL_TABLET | Freq: Four times a day (QID) | ORAL | Status: DC | PRN
  Administered 2019-01-09 – 2019-01-11 (×6): 2.5 mg via ORAL
  Filled 2019-01-09 (×7): qty 1

## 2019-01-09 MED ORDER — MAGNESIUM SULFATE 2 GM/50ML IV SOLN
2.0000 g | Freq: Once | INTRAVENOUS | Status: AC
  Administered 2019-01-09: 2 g via INTRAVENOUS
  Filled 2019-01-09: qty 50

## 2019-01-09 MED ORDER — DOLUTEGRAVIR SODIUM 50 MG PO TABS
50.0000 mg | ORAL_TABLET | Freq: Every day | ORAL | Status: DC
  Administered 2019-01-09 – 2019-01-11 (×3): 50 mg via ORAL
  Filled 2019-01-09 (×3): qty 1

## 2019-01-09 MED ORDER — POTASSIUM CHLORIDE CRYS ER 20 MEQ PO TBCR
40.0000 meq | EXTENDED_RELEASE_TABLET | Freq: Two times a day (BID) | ORAL | Status: AC
  Administered 2019-01-09: 40 meq via ORAL
  Filled 2019-01-09: qty 2

## 2019-01-09 MED ORDER — OXYCODONE HCL 5 MG PO TABS
2.5000 mg | ORAL_TABLET | Freq: Once | ORAL | Status: AC | PRN
  Administered 2019-01-09: 2.5 mg via ORAL
  Filled 2019-01-09: qty 1

## 2019-01-09 MED ORDER — DIPHENHYDRAMINE HCL 25 MG PO CAPS
25.0000 mg | ORAL_CAPSULE | Freq: Once | ORAL | Status: AC
  Administered 2019-01-09: 25 mg via ORAL
  Filled 2019-01-09: qty 1

## 2019-01-09 MED ORDER — EMTRICITAB-RILPIVIR-TENOFOV AF 200-25-25 MG PO TABS: 1.0000 | ORAL_TABLET | Freq: Every day | ORAL | Status: DC

## 2019-01-09 MED ORDER — POTASSIUM CHLORIDE CRYS ER 20 MEQ PO TBCR: 40.0000 meq | EXTENDED_RELEASE_TABLET | Freq: Three times a day (TID) | ORAL | Status: DC

## 2019-01-09 MED ORDER — CYCLOBENZAPRINE HCL 5 MG PO TABS
5.0000 mg | ORAL_TABLET | Freq: Once | ORAL | Status: AC
  Administered 2019-01-09: 5 mg via ORAL
  Filled 2019-01-09: qty 1

## 2019-01-09 MED ORDER — IOHEXOL 300 MG/ML  SOLN
100.0000 mL | Freq: Once | INTRAMUSCULAR | Status: AC | PRN
  Administered 2019-01-09: 100 mL via INTRAVENOUS

## 2019-01-09 MED ORDER — POTASSIUM CHLORIDE 20 MEQ PO PACK
40.0000 meq | PACK | ORAL | Status: AC
  Administered 2019-01-09 (×2): 40 meq via ORAL
  Filled 2019-01-09 (×3): qty 2

## 2019-01-09 MED ORDER — LIDOCAINE 5 % EX PTCH
1.0000 | MEDICATED_PATCH | CUTANEOUS | Status: DC
  Administered 2019-01-09 – 2019-01-11 (×3): 1 via TRANSDERMAL
  Filled 2019-01-09 (×3): qty 1

## 2019-01-09 NOTE — Consult Note (Signed)
Reason for Consult: Elevated liver enzymes Referring Physician: Family Medicine  Ola SpurrLinston R Luginbill HPI: This is a 28 year old male with a PMH of HIV with medical noncompliance, HBV (HBeAg+), HCV, and chronic cryptococcal meningitis admitted for complaints of headache.  The patient reports having a headache and vomiting x 4 days without any fever.  He is s/p ventriculostomy on 04/2017 and a VP shunt on 05/2017 as he had seizure activity with the cryptococcal infection.  The shunt was removed as he had ain issue with sepsis.  Work up in the ER was negative for any evidence of HSV and his cryptococcal titer was elevated.   On admission his liver enzymes and TB was noted to be markedly elevated:  AST 604, ALT 142, AP 227, and TB 23.4.  Overnight his values have mildly declined.  Today he is feeling better, but he complains of some back pain.  He denies any sore throat, but he feels that his sitter at home was sick.   Past Medical History:  Diagnosis Date  . 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 Anxiety   . 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 Bipolar 1 disorder (HCC)   . 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 Cognitive communication deficit    working w/ therapy  . 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 Depression   . 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 Fecal incontinence 11/19/2017  . Foley catheter in place   . 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 History of scabies 2013  . History of  syphilis   . 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 Neurogenic bowel    wears depends  . Neuromuscular disorder (HCC)   . 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 Seizure disorder [facility](HCC)    neurologist-  dr Marjory Liespenumalli ([facility])--caused by cryptococcal meningitis/ multiple infarctions--  no seizure since hospitalization May 2018  . Status epilepticus (HCC)   . Visuospatial deficit     Past Surgical History:  Procedure Laterality Date  . CYSTOSCOPY N/A 10/10/2017   Procedure: CYSTOSCOPY;  Surgeon: Sebastian AcheManny, Theodore, MD;  [facility];  Service: Urology;  Laterality: N/A;  . EXAMINATION UNDER ANESTHESIA N/A 12/28/2014   Procedure: EXAM UNDER ANESTHESIA;  Surgeon: Karie SodaSteven Gross, MD;  Location: [facility];  Service: General;  Laterality: N/A;  . FLOOR OF MOUTH BIOPSY N/A 09/16/2016   Procedure: BIOPSY OF ORAL ABCESS;  Surgeon: Newman PiesSu Teoh, MD;  Location: [facility];  Service: ENT;  Laterality: N/A;  . INCISION AND DRAINAGE ABSCESS N/A 09/16/2016   Procedure: INCISION AND DRAINAGE ABSCESS;  Surgeon: Newman PiesSu Teoh, MD;  Location: [facility];  Service: ENT;  Laterality: N/A;  . INCISION AND DRAINAGE PERIRECTAL ABSCESS  09/ 03/ 2011   dr toth  . INCISION AND DRAINAGE PERIRECTAL ABSCESS N/A 12/28/2014   Procedure: IRRIGATION AND DEBRIDEMENT PERIRECTAL ABSCESS;  Surgeon: Karie SodaSteven Gross, MD;  Location: [facility];  Service: General;  Laterality: N/A;  Fistula repair and ablation  . INSERTION OF SUPRAPUBIC CATHETER N/A 10/10/2017   Procedure: INSERTION OF SUPRAPUBIC CATHETER;  Surgeon: Sebastian AcheManny, Theodore, MD;  [facility];  Service: Urology;  Laterality: N/A;  . LAPAROSCOPIC REVISION VENTRICULAR-PERITONEAL (V-P) SHUNT N/A 06/12/2017   Procedure: LAPAROSCOPIC REVISION VENTRICULAR-PERITONEAL (V-P) SHUNT;  Surgeon: Kinsinger, De BlanchLuke Aaron, MD;  Location: [facility];  Service: General;  Laterality: N/A;  . SHUNT  REMOVAL N/A 01/01/2018   Procedure: REMOVAL OF V-P SHUNT, PLACEMENT OF EVD;  Surgeon: Lisbeth RenshawNundkumar, Neelesh, MD;  Location: [facility];  Service: Neurosurgery;  Laterality: N/A;  . TRANSTHORACIC ECHOCARDIOGRAM  06/02/2017   EF 65-70%/  trivial PR  . VENTRICULOPERITONEAL SHUNT N/A 06/12/2017   Procedure: SHUNT INSERTION VENTRICULAR-PERITONEAL;  Surgeon: Lisbeth RenshawNundkumar, Neelesh, MD;  Location: [facility];  Service: Neurosurgery;  Laterality: N/A;    Family History  Problem Relation Age of Onset  . Hypertension Other     Social History:  reports that he has been smoking cigarettes. He has a 2.50 pack-year smoking history. He has never used smokeless tobacco. He reports current alcohol use. He reports that he does not use drugs.  Allergies:  Allergies  Allergen Reactions  . Acyclovir And Related Itching    Medications:  Scheduled: . dolutegravir  50 mg Oral Daily  . emtricitabine-rilpivir-tenofovir AF  1 tablet Oral Q breakfast  . levETIRAcetam  1,000 mg Oral BID  . lidocaine  1 patch Transdermal Q24H  . nicotine  14 mg Transdermal Daily  . potassium chloride  40 mEq Oral Q4H  . sulfamethoxazole-trimethoprim  1 tablet Oral Daily  . valACYclovir  1,000 mg Oral Q M,W,F   Continuous:   Results for orders placed or performed during the hospital encounter of 01/08/19 (from the past 24 hour(s))  Basic metabolic panel     Status: Abnormal   Collection Time: 01/08/19  8:06 PM  Result Value Ref Range   Sodium 136 135 - 145 mmol/L   Potassium 2.7 (LL) 3.5 - 5.1 mmol/L   Chloride 108 98 - 111 mmol/L   CO2 21 (L) 22 - 32 mmol/L   Glucose, Bld 102 (H) 70 - 99 mg/dL   BUN 7 6 - 20 mg/dL   Creatinine, Ser 4.091.55 (H) 0.61 - 1.24 mg/dL   Calcium 8.4 (L) 8.9 - 10.3 mg/dL   GFR calc non Af Amer >60 >60 mL/min   GFR calc Af Amer >60 >60 mL/min   Anion gap 7 5 - 15  Urine rapid drug screen (hosp performed)     Status: None   Collection Time: 01/08/19  8:58 PM  Result Value Ref Range   Opiates NONE DETECTED NONE  DETECTED   Cocaine NONE DETECTED NONE DETECTED   Benzodiazepines NONE DETECTED NONE DETECTED   Amphetamines NONE DETECTED NONE DETECTED   Tetrahydrocannabinol NONE DETECTED NONE DETECTED   Barbiturates NONE DETECTED NONE DETECTED  Ammonia     Status: Abnormal   Collection Time: 01/08/19  9:14 PM  Result Value Ref Range   Ammonia 117 (H) 9 - 35 umol/L  Comprehensive metabolic panel     Status: Abnormal   Collection Time: 01/08/19  9:14 PM  Result Value Ref Range   Sodium 136 135 - 145 mmol/L   Potassium 3.1 (L) 3.5 - 5.1 mmol/L   Chloride 110 98 - 111 mmol/L   CO2 19 (L) 22 - 32 mmol/L   Glucose, Bld 102 (H) 70 - 99 mg/dL   BUN  8 6 - 20 mg/dL   Creatinine, Ser 1.61 (H) 0.61 - 1.24 mg/dL   Calcium 8.3 (L) 8.9 - 10.3 mg/dL   Total Protein 9.1 (H) 6.5 - 8.1 g/dL   Albumin 1.9 (L) 3.5 - 5.0 g/dL   AST 096 (H) 15 - 41 U/L   ALT 118 (H) 0 - 44 U/L   Alkaline Phosphatase 184 (H) 38 - 126 U/L   Total Bilirubin 21.4 (HH) 0.3 - 1.2 mg/dL   GFR calc non Af Amer >60 >60 mL/min   GFR calc Af Amer >60 >60 mL/min   Anion gap 7 5 - 15  CBC     Status: Abnormal   Collection Time: 01/09/19  5:00 AM  Result Value Ref Range   WBC 5.0 4.0 - 10.5 K/uL   RBC 4.06 (L) 4.22 - 5.81 MIL/uL   Hemoglobin 12.5 (L) 13.0 - 17.0 g/dL   HCT 04.5 (L) 40.9 - 81.1 %   MCV 84.5 80.0 - 100.0 fL   MCH 30.8 26.0 - 34.0 pg   MCHC 36.4 (H) 30.0 - 36.0 g/dL   RDW 91.4 (H) 78.2 - 95.6 %   Platelets 156 150 - 400 K/uL   nRBC 0.0 0.0 - 0.2 %  Comprehensive metabolic panel     Status: Abnormal   Collection Time: 01/09/19  5:00 AM  Result Value Ref Range   Sodium 136 135 - 145 mmol/L   Potassium 2.8 (L) 3.5 - 5.1 mmol/L   Chloride 108 98 - 111 mmol/L   CO2 20 (L) 22 - 32 mmol/L   Glucose, Bld 107 (H) 70 - 99 mg/dL   BUN 8 6 - 20 mg/dL   Creatinine, Ser 2.13 (H) 0.61 - 1.24 mg/dL   Calcium 8.8 (L) 8.9 - 10.3 mg/dL   Total Protein 9.5 (H) 6.5 - 8.1 g/dL   Albumin 1.9 (L) 3.5 - 5.0 g/dL   AST 086 (H) 15 - 41 U/L    ALT 119 (H) 0 - 44 U/L   Alkaline Phosphatase 190 (H) 38 - 126 U/L   Total Bilirubin 22.0 (HH) 0.3 - 1.2 mg/dL   GFR calc non Af Amer >60 >60 mL/min   GFR calc Af Amer >60 >60 mL/min   Anion gap 8 5 - 15  Basic metabolic panel     Status: Abnormal   Collection Time: 01/09/19  1:35 PM  Result Value Ref Range   Sodium 136 135 - 145 mmol/L   Potassium 2.8 (L) 3.5 - 5.1 mmol/L   Chloride 106 98 - 111 mmol/L   CO2 20 (L) 22 - 32 mmol/L   Glucose, Bld 104 (H) 70 - 99 mg/dL   BUN 8 6 - 20 mg/dL   Creatinine, Ser 5.78 (H) 0.61 - 1.24 mg/dL   Calcium 8.6 (L) 8.9 - 10.3 mg/dL   GFR calc non Af Amer >60 >60 mL/min   GFR calc Af Amer >60 >60 mL/min   Anion gap 10 5 - 15     Dg Chest 2 View  Result Date: 01/08/2019 CLINICAL DATA:  Vomiting and weakness EXAM: CHEST - 2 VIEW COMPARISON:  December 30, 2017 FINDINGS: There is no edema or consolidation. The heart size and pulmonary vascularity are normal. No adenopathy. No bone lesions. IMPRESSION: No edema or consolidation. Electronically Signed   By: Bretta Bang III M.D.   On: 01/08/2019 08:56   Ct Head W Or Wo Contrast  Result Date: 01/08/2019 CLINICAL DATA:  Right-sided headache over  the last 2 days. Blurred vision. HIV positive. EXAM: CT HEAD WITHOUT AND WITH CONTRAST TECHNIQUE: Contiguous axial images were obtained from the base of the skull through the vertex without and with intravenous contrast CONTRAST:  75mL OMNIPAQUE IOHEXOL 300 MG/ML  SOLN COMPARISON:  09/30/2018 FINDINGS: Brain: Mild cerebral atrophy. 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 Chronic low-density in the right frontal lobe related to the previous right ventriculostomy catheter. No sign of acute infarction, mass lesion, hemorrhage, hydrocephalus or extra-axial collection. Vascular: No abnormal vascular finding. Skull: No significant finding. Previous burr hole right frontal region. Sinuses/Orbits: Mild maxillary sinus mucosal thickening. Orbits negative.  Other: None IMPRESSION: No acute finding. Cerebellar more than cerebral atrophy. Chronic low-density in the right frontal lobe related to the previous ventriculostomy catheter. Electronically Signed   By: Paulina Fusi M.D.   On: 01/08/2019 10:42   US Abdomen Limited Ruq  Result Date: 01/08/2019 CLINICAL DATA:  Right upper quadrant pain EXAM: ULTRASOUND ABDOMEN LIMITED RIGHT UPPER QUADRANT COMPARISON:  CT abdomen and pelvis September 28, 2018 FINDINGS: Gallbladder: No gallstones are appreciable on this study. There is sludge in the gallbladder. The gallbladder wall is mildly thickened. There is no pericholecystic fluid. No sonographic Murphy sign noted by sonographer. Common bile duct: Diameter: 3 mm. No intrahepatic or extrahepatic biliary duct dilatation Liver: No focal lesion identified. Within normal limits in parenchymal echogenicity. Portal vein is patent on color Doppler imaging with normal direction of blood flow towards the liver. IMPRESSION: No gallstones are evident. However, there is sludge in the gallbladder which could potentially obscure tiny gallstones. Gallbladder wall is mildly thickened. The possibility of early cholecystitis must be of concern in this circumstance. This finding may warrant nuclear medicine hepatobiliary imaging study to assess for cystic duct patency. Study otherwise unremarkable. Electronically Signed   By: Bretta Bang III M.D.   On: 01/08/2019 08:19    ROS:  As stated above in the HPI otherwise negative.  Blood pressure (!) 83/55, pulse 75, temperature (!) 97.4 F (36.3 C), temperature source Oral, resp. rate 18, height 6\' 1"  (1.854 m), weight 80.5 kg, SpO2 100 %.    PE: Gen: NAD, Alert and Oriented HEENT:  [place]/AT, EOMI Neck: Supple, no LAD Lungs: CTA Bilaterally CV: RRR without M/G/R ABM: Soft, NTND, +BS Ext: No C/C/E  Assessment/Plan: 1) Elevated liver enzymes 2) Chronic HBV infection. 3) Chronic HCV. 4) HIV/AIDS. 5) Chronic cryptococcal  infection.   The patient does have a history of elevated liver enzymes, but it is much more elevated during this admission.  He has a history of being HBeAg+ and patients can exhibit a marked elevation in his liver enzymes with spontaneous seroconverstion from HBeAg to HBeAb.  There is also a consideration of EBV as a source of his symptoms.  Surprisingly, he does not have significant scleral icterus with his markedly elevated TB.  Clinically he reports feeling better.  He wants to consolidate his blood work and the additional serologies will be obtained in the AM.  Plan: 1) Check HBeAg/Ab status. 2) Agree with the HBV DNA. 3) Check EBV status.  Roverto Bodmer D 01/09/2019, 3:50 PM

## 2019-01-09 NOTE — Evaluation (Signed)
Physical Therapy Evaluation Patient Details Name: Justin Ball MRN: [id number] DOB: 07/27/1991 Today's Date: 01/09/2019   History of Present Illness  Justin Ball is a 28 y.o. male presenting with 4 days of nausea, vomiting, and headache. 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  Clinical Impression  Pt is at or close to baseline functioning and should be safe at home alone. There are no further acute PT needs.  Will sign off at this time.     Follow Up Recommendations No PT follow up;Supervision - Intermittent    Equipment Recommendations  None recommended by PT    Recommendations for Other Services       Precautions / Restrictions Precautions Precautions: Fall(minor risk due to deconditioning from recent inactivity)      Mobility  Bed Mobility Overal bed mobility: Independent                Transfers Overall transfer level: Independent                  Ambulation/Gait Ambulation/Gait assistance: Independent Gait Distance (Feet): 160 Feet Assistive device: None Gait Pattern/deviations: Step-through pattern   Gait velocity interpretation: >2.62 ft/sec, indicative of community ambulatory General Gait Details: low amplitude and shuffled steps with mild instability/deviation but able to scan and turn abruptly without any overt LOB.  Expect quick improvement one feeling better.  Stairs            Wheelchair Mobility    Modified Rankin (Stroke Patients Only)       Balance Overall balance assessment: No apparent balance deficits (not formally assessed)                                           Pertinent Vitals/Pain Pain Assessment: Faces Faces Pain Scale: Hurts little more Pain Location: back pain Pain Descriptors / Indicators: Aching;Sore Pain Intervention(s): Monitored during  session    Home Living Family/patient expects to be discharged to:: Private residence Living Arrangements: Other relatives(Mom and Dad) Available Help at Discharge: Family;Available PRN/intermittently Type of Home: House Home Access: Level entry     Home Layout: One level Home Equipment: None      Prior Function Level of Independence: Independent               Hand Dominance        Extremity/Trunk Assessment   Upper Extremity Assessment Upper Extremity Assessment: Overall WFL for tasks assessed    Lower Extremity Assessment Lower Extremity Assessment: Overall WFL for tasks assessed(general weakness, non focal, but functional)       Communication   Communication: No difficulties  Cognition Arousal/Alertness: Awake/alert Behavior During Therapy: WFL for tasks assessed/performed Overall Cognitive Status: Within Functional Limits for tasks assessed                                        General Comments      Exercises     Assessment/Plan    PT Assessment Patent does not need any further PT services  PT Problem List         PT Treatment Interventions      PT Goals (Current goals can be found in the Care  Plan section)  Acute Rehab PT Goals Patient Stated Goal: feel better and get rid of my pain PT Goal Formulation: All assessment and education complete, DC therapy    Frequency     Barriers to discharge        Co-evaluation               AM-PAC PT "6 Clicks" Mobility  Outcome Measure Help needed turning from your back to your side while in a flat bed without using bedrails?: None Help needed moving from lying on your back to sitting on the side of a flat bed without using bedrails?: None Help needed moving to and from a bed to a chair (including a wheelchair)?: None Help needed standing up from a chair using your arms (e.g., wheelchair or bedside chair)?: None Help needed to walk in hospital room?: None Help needed  climbing 3-5 steps with a railing? : None 6 Click Score: 24    End of Session   Activity Tolerance: Patient tolerated treatment well Patient left: in bed;with call bell/phone within reach Nurse Communication: Mobility status PT Visit Diagnosis: Unsteadiness on feet (R26.81)    Time: [id number] PT Time Calculation (min) (ACUTE ONLY): 15 min   Charges:   PT Evaluation $PT Eval Low Complexity: 1 Low          01/09/2019  Lafayette BingKen Tod Abrahamsen, PT Acute Rehabilitation Services [phone]  (pager) [phone]  (office)  Justin Ball 01/09/2019, 11:16 AM

## 2019-01-09 NOTE — Progress Notes (Signed)
Family Medicine Teaching Service Daily Progress Note Intern Pager: [phone]  Patient name: Justin Ball Medical record number: [id number] Date of birth: 1991/05/01 Age: 28 y.o. Gender: male  Primary Care Provider: Patient, No Pcp Per Consultants: ID, GI Code Status: full  Pt Overview and Major Events to Date:  1/15 admitted  Assessment and Plan: Justin Ball is a 28 y.o. male presenting with 4 days of nausea, vomiting, and headache. 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   Jaundice with associated N/V, headache  Chronic Hepatitis B and C: Patient states that he has resolved headache and no longer experiencing nausea or back pain. No abdominal pain on exam. Jaundice and scleral icterus present. Appears to have blood and possible stool on blankets. RUQ ultrasound significant for biliary sludge and mildly thickened gallbladder without stones and some concern for cholecystitis- will reach out to GI today for recommendations. ID has been consulted and seen the patient, I appreciate their recs and management. See labs section for relevant lab values. - Follow up pending labs - Consult GI for recs - ID consulted, will follow up recs  LP, hold odefsey and tivicay, hold antifungal therapy, serum cryptococcal antigen, CD4 count, HIV viral load, Hep B/C viral loads - Vitals per floor  - continuous cardiac monitoring - f/u Psychiatry recs regarding capacity, recommended by Dr. [name] on 1/14 - Tylenol PRN - f/u PT/OT recs - monitor PT/INR, and discontinued subQ heparin for DVT ppx  Hypokalemia: K+ 2.8 this morning but had received after this was drawn and received another this morning. Will recheck this afternoon. Mg+ 1.9 yesterday. - Repeat BMP at 1600 - pharmacy consult for K+ replete - replete Mg++ today and recheck in  am  Prolonged QT: QTc 455 on EKG today. NSR - consider restarting Odefsey, Tivicay, and Fluconazole per ID - Repeat EKG in AM  HIV/AIDs on ART Home meds include Odefsey QD, Tivicay QD and prophylactic Bactrim QD. 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 He followed up with Dr. Orvan Falconer with ID on 11/28/2018 and was continued on Fluconazole and Keppra and was to follow up on 1/28 with Dr. [name]. - Plan as above - hold fluconazole given prolonged QTc, appreciate ID recs - Continue home Keppra  Bipolar disorder  Agitation : On home Seroquel 25mg  BID PRN. Concern for lack of capacity per Dr. [name]. Will consult psychiatry.  - Seroquel PRN for agitation - Psych consulted, will follow up  Tobacco Use -nicotine patch   FEN/GI: Heart healthy diet, Miralax PRN Prophylaxis: Heparin SQ  Disposition: Telemetry for workup and pending IR recs   Subjective:  Patient appears to be sleeping comfortably on exam. Denies headache, N/V, abdominal or back pain this morning. He has no questions or concerns at this time.  Objective: Temp:  [97.6 F (36.4 C)-98.3 F (36.8 C)] 98.1 F (36.7 C) (01/16 0542) Pulse Rate:  [68-98] 86 (01/16 0542) Resp:  [14-19] 18 (01/16 0542) BP: (93-118)/(53-91) 114/67 (01/16 0542) SpO2:  [96 %-100 %] 97 % (01/16 0542) Weight:  [80.5  kg] 80.5 kg (01/15 1900) Physical Exam: General: NAD, sleepy, mild scleral icterus present Cardiac: RRR, normal heart sounds, no murmurs Respiratory: CTAB, normal effort, No wheezes, rales or rhonchi Abdomen: soft, nontender, nondistended, no hepatic or  splenomegaly Extremities: no edema to LEs Skin: warm and dry, mild jaundice Neuro: alert and oriented x4, no focal deficits Psych: flat mood   Laboratory: Recent Labs  Lab 01/08/19 0145 01/09/19 0500  WBC 4.8 5.0  HGB 14.7 12.5*  HCT 42.5 34.3*  PLT 173 156   Recent Labs  Lab 01/08/19 0145 01/08/19 2006 01/08/19 2114 01/09/19 0500  NA 135 136 136 136  K 2.6* 2.7* 3.1* 2.8*  CL 101 108 110 108  CO2 24 21* 19* 20*  BUN 10 7 8 8   CREATININE 1.26* 1.55* 1.45* 1.33*  CALCIUM 8.9 8.4* 8.3* 8.8*  PROT 10.7*  --  9.1* 9.5*  BILITOT 23.4*  --  21.4* 22.0*  ALKPHOS 227*  --  184* 190*  ALT 142*  --  118* 119*  AST 604*  --  475* 492*  GLUCOSE 99 102* 102* 107*   Lipase wnl Hepatitis panel pending HCV RNA quant pending PT 16.4 INR 1.34 PTT 43 Magnesium 1.9 Blood cultures NG x1 day CD4 count pending Ammonia 117  CSF:  HSV pending  Cryptococcal Ag positive 10-640  Fungus culture pending  Protein 42  Glucose 50  Culture pending  Cell count xanthochromic, 1-6 RBC, 0-2 WBC, rare lymphs  Urinalysis    Component Value Date/Time   COLORURINE AMBER (A) 01/08/2019 1511   APPEARANCEUR CLEAR 01/08/2019 1511   LABSPEC 1.027 01/08/2019 1511   PHURINE 7.0 01/08/2019 1511   GLUCOSEU NEGATIVE 01/08/2019 1511   HGBUR NEGATIVE 01/08/2019 1511   BILIRUBINUR MODERATE (A) 01/08/2019 1511   KETONESUR NEGATIVE 01/08/2019 1511   PROTEINUR NEGATIVE 01/08/2019 1511   UROBILINOGEN 4.0 (H) 09/09/2014 1055   NITRITE NEGATIVE 01/08/2019 1511   LEUKOCYTESUR NEGATIVE 01/08/2019 1511   Urine culture pending UDS negative  Imaging/Diagnostic Tests: Dg Chest 2 View  Result Date: 01/08/2019 CLINICAL DATA:  Vomiting and weakness EXAM: CHEST - 2 VIEW COMPARISON:  December 30, 2017 FINDINGS: There is no edema or consolidation. The heart size and pulmonary vascularity are normal. No adenopathy. No bone lesions. IMPRESSION: No edema or consolidation. Electronically Signed   By: Bretta Bang  III M.D.   On: 01/08/2019 08:56   Ct Head W Or Wo Contrast  Result Date: 01/08/2019 CLINICAL DATA:  Right-sided headache over the last 2 days. Blurred vision. HIV positive. EXAM: CT HEAD WITHOUT AND WITH CONTRAST TECHNIQUE: Contiguous axial images were obtained from the base of the skull through the vertex without and with intravenous contrast CONTRAST:  38mL OMNIPAQUE IOHEXOL 300 MG/ML  SOLN COMPARISON:  09/30/2018 FINDINGS: Brain: Mild cerebral atrophy. 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 Chronic low-density in the right frontal lobe related to the previous right ventriculostomy catheter. No sign of acute infarction, mass lesion, hemorrhage, hydrocephalus or extra-axial collection. Vascular: No abnormal vascular finding. Skull: No significant finding. Previous burr hole right frontal region. Sinuses/Orbits: Mild maxillary sinus mucosal thickening. Orbits negative. Other: None IMPRESSION: No acute finding. Cerebellar more than cerebral atrophy. Chronic low-density in the right frontal lobe related to the previous ventriculostomy catheter. Electronically Signed   By: Paulina Fusi M.D.   On: 01/08/2019 10:42   US Abdomen Limited Ruq  Result Date: 01/08/2019 CLINICAL DATA:  Right upper quadrant pain EXAM: ULTRASOUND ABDOMEN LIMITED RIGHT UPPER QUADRANT COMPARISON:  CT  abdomen and pelvis September 28, 2018 FINDINGS: Gallbladder: No gallstones are appreciable on this study. There is sludge in the gallbladder. The gallbladder wall is mildly thickened. There is no pericholecystic fluid. No sonographic Murphy sign noted by sonographer. Common bile duct: Diameter: 3 mm. No intrahepatic or extrahepatic biliary duct dilatation Liver: No focal lesion identified. Within normal limits in parenchymal echogenicity. Portal vein is patent on color Doppler imaging with normal direction of blood flow towards the liver. IMPRESSION: No gallstones are evident. However, there is sludge in the gallbladder which  could potentially obscure tiny gallstones. Gallbladder wall is mildly thickened. The possibility of early cholecystitis must be of concern in this circumstance. This finding may warrant nuclear medicine hepatobiliary imaging study to assess for cystic duct patency. Study otherwise unremarkable. Electronically Signed   By: Bretta BangWilliam  Woodruff III M.D.   On: 01/08/2019 08:19     Leeroy BockAnderson, Destine Zirkle L, DO 01/09/2019, 7:21 AM PGY-1, [facility] FPTS Intern pager: [phone], text pages welcome

## 2019-01-09 NOTE — Progress Notes (Signed)
Contacted Dr. Elnoria Howard with on-call GI. Plans to consult. Recommended CT abd/pelvis w/ contrast.   Durward Parcel, DO [facility], PGY-3

## 2019-01-09 NOTE — Progress Notes (Addendum)
[facility]   Reason for visit: Follow up on N/V  Interval History: reports no N/V today.  Feels better overall.  No new complaints just pain " all over".  WBC 5, no fever. T bili and LFTs remain up.  HIV RNA pending.    Physical Exam: Constitutional:  Vitals:   01/08/19 2145 01/09/19 0542  BP: 118/73 114/67  Pulse: 76 86  Resp: 18 18  Temp: 97.6 F (36.4 C) 98.1 F (36.7 C)  SpO2: 99% 97%   patient appears in NAD Eyes: icteric Respiratory: Normal respiratory effort; CTA B Cardiovascular: RRR GI: soft, nt, nd  Review of Systems: Constitutional: positive for fevers and chills Gastrointestinal: negative for reflux symptoms, nausea, vomiting and diarrhea Integument/breast: negative for rash  Lab Results  Component Value Date   WBC 5.0 01/09/2019   HGB 12.5 (L) 01/09/2019   HCT 34.3 (L) 01/09/2019   MCV 84.5 01/09/2019   PLT 156 01/09/2019    Lab Results  Component Value Date   CREATININE 1.33 (H) 01/09/2019   BUN 8 01/09/2019   NA 136 01/09/2019   K 2.8 (L) 01/09/2019   CL 108 01/09/2019   CO2 20 (L) 01/09/2019    Lab Results  Component Value Date   ALT 119 (H) 01/09/2019   AST 492 (H) 01/09/2019   ALKPHOS 190 (H) 01/09/2019     Microbiology: Recent Results (from the past 240 hour(s))  Culture, blood (routine x 2)     Status: None (Preliminary result)   Collection Time: 01/08/19  8:33 AM  Result Value Ref Range Status   Specimen Description BLOOD RIGHT ANTECUBITAL  Final   Special Requests   Final    BOTTLES DRAWN AEROBIC AND ANAEROBIC Blood Culture adequate volume   Culture   Final    NO GROWTH 1 DAY Performed at [facility], [address] [place], [name] [address]    Report Status PENDING  Incomplete  Culture, blood (routine x 2)     Status: None (Preliminary result)   Collection Time: 01/08/19  8:37 AM  Result Value Ref Range Status   Specimen Description BLOOD RIGHT HAND  Final   Special Requests   Final      BOTTLES DRAWN AEROBIC AND ANAEROBIC Blood Culture adequate volume   Culture   Final    NO GROWTH 1 DAY Performed at [facility], [address], [place], [name] [address]    Report Status PENDING  Incomplete  CSF culture     Status: None (Preliminary result)   Collection Time: 01/08/19 11:53 AM  Result Value Ref Range Status   Specimen Description CSF  Final   Special Requests Immunocompromised  Final   Gram Stain   Final    CYTOSPIN SMEAR WBC PRESENT, PREDOMINANTLY MONONUCLEAR NO ORGANISMS SEEN    Culture   Final    NO GROWTH < 24 HOURS Performed at [facility], [address], [place], [name] [address]    Report Status PENDING  Incomplete  Fungus Culture With Stain     Status: None (Preliminary result)   Collection Time: 01/08/19 11:53 AM  Result Value Ref Range Status   Fungus Stain PENDING  Incomplete   Fungus (Mycology) Culture PENDING  Incomplete   Fungal Source CSF  Final    Comment: Performed at [facility], [address], [place], [name] [address]    Impression/Plan:  1. N/V - seems to be  resolving.  Continue supportive care.  2. Headache - no significant signs of meningitis on CSF studies.  Cryptococcal Ag positive as anticipated.  Will need to continue fluconazole prophylaxis when his LFTs improve.  3.  HIV - likely poorly compliant but CD4 and RNA pending still.    4.  Hyperbilirubinemia - unclear etiology.  AST and ALT up and may be partially due to viral hepatitis exacerbation off of his ARVs (presumably).

## 2019-01-09 NOTE — Consult Note (Signed)
[facility] Face-to-Face Psychiatry Consult   Reason for Consult:  Capacity and bipolar disorder Referring Physician:  Dr. 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 male patient admitted with jaundice.  HPI:   Per chart review, patient was admitted with jaundice associated with nausea, vomiting and headache. Psychiatry was consulted for management of bipolar disorder due to agitation and capacity evaluation for management of his health conditions. Home medications include Seroquel 25 mg BID PRN. He is prescribed Keppra for seizures secondary cryptococcal meningoencephalitis. UDS was negative on admission.   On interview, Justin Ball reports a history of bipolar disorder but he "does not have it anymore."  He reports that he saw a therapist 2-3 years ago and was informed of this information.  He denies feeling depressed and reports that his mood has been stable.  He denies a history of manic symptoms (decreased need for sleep, increased energy, pressured speech or euphoria).  He reports a history of anxiety and reports that Seroquel effective for anxiety.  He takes it once daily.  He denies problems with sleep or appetite.  He denies SI, HI or AVH.  He follows up with his PCP, Dr. [name].  He is able to name some of his medical conditions.  He lacks nderstanding of the reason why he has seizures.  He is unable to name the medications that he is prescribed.  Past Psychiatric History: Bipolar disorder, depression and anxiety.   Risk to Self:  None.  Denies SI. Risk to Others:  None. Denies HI.  Prior Inpatient Therapy:  None per chart review.   Prior Outpatient Therapy:  He is followed by his PCP.   Past Medical History:  Past Medical History:  Diagnosis Date  . 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 Anxiety   . 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 Bipolar 1 disorder (HCC)   . 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 Cognitive communication deficit    working w/ therapy  . 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 Depression   . 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 Fecal incontinence 11/19/2017  . Foley catheter in place   . 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 History of scabies 2013  . History of syphilis   . 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 Neurogenic bowel    wears depends  . Neuromuscular disorder (HCC)   . 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 Seizure disorder (HCC)  neurologist-  dr Marjory Lies ([facility])--caused by cryptococcal meningitis/ multiple infarctions--  no seizure since hospitalization May 2018  . Status epilepticus (HCC)   . Visuospatial deficit     Past Surgical History:  Procedure Laterality Date  . CYSTOSCOPY N/A 10/10/2017   Procedure: CYSTOSCOPY;  Surgeon: Sebastian Ache, MD;  Location: [facility];  Service: Urology;  Laterality: N/A;  . EXAMINATION UNDER ANESTHESIA N/A 12/28/2014    Procedure: EXAM UNDER ANESTHESIA;  Surgeon: Karie Soda, MD;  Location: [facility];  Service: General;  Laterality: N/A;  . FLOOR OF MOUTH BIOPSY N/A 09/16/2016   Procedure: BIOPSY OF ORAL ABCESS;  Surgeon: Newman Pies, MD;  Location: [facility];  Service: ENT;  Laterality: N/A;  . INCISION AND DRAINAGE ABSCESS N/A 09/16/2016   Procedure: INCISION AND DRAINAGE ABSCESS;  Surgeon: Newman Pies, MD;  Location: [facility];  Service: ENT;  Laterality: N/A;  . INCISION AND DRAINAGE PERIRECTAL ABSCESS  09/ 03/ 2011   dr toth  . INCISION AND DRAINAGE PERIRECTAL ABSCESS N/A 12/28/2014   Procedure: IRRIGATION AND DEBRIDEMENT PERIRECTAL ABSCESS;  Surgeon: Karie Soda, MD;  Location: [facility];  Service: General;  Laterality: N/A;  Fistula repair and ablation  . INSERTION OF SUPRAPUBIC CATHETER N/A 10/10/2017   Procedure: INSERTION OF SUPRAPUBIC CATHETER;  Surgeon: Sebastian Ache, MD;  Location: [facility];  Service: Urology;  Laterality: N/A;  . LAPAROSCOPIC REVISION VENTRICULAR-PERITONEAL (V-P) SHUNT N/A 06/12/2017   Procedure: LAPAROSCOPIC REVISION VENTRICULAR-PERITONEAL (V-P) SHUNT;  Surgeon: Kinsinger, De Blanch, MD;  Location: [facility];  Service: General;  Laterality: N/A;  . SHUNT REMOVAL N/A 01/01/2018   Procedure: REMOVAL OF V-P SHUNT, PLACEMENT OF EVD;  Surgeon: [name], MD;  Location: [facility];  Service: Neurosurgery;  Laterality: N/A;  . TRANSTHORACIC ECHOCARDIOGRAM  06/02/2017   EF 65-70%/  trivial PR  . VENTRICULOPERITONEAL SHUNT N/A 06/12/2017   Procedure: SHUNT INSERTION VENTRICULAR-PERITONEAL;  Surgeon: [name], MD;  Location[facility];  Service: Neurosurgery;  Laterality: N/A;   Family History:  Family History  Problem Relation Age of Onset  . Hypertension Other    Family Psychiatric  History: Denies  Social History:  Social History   Substance and Sexual Activity  Alcohol Use Yes     Social History   Substance and Sexual Activity  Drug Use No    Social History    Socioeconomic History  . Marital status: Single    Spouse name: Not on file  . Number of children: Not on file  . Years of education: Not on file  . Highest education level: Not on file  Occupational History  . Not on file  Social Needs  . Financial resource strain: Not on file  . Food insecurity:    Worry: Not on file    Inability: Not on file  . Transportation needs:    Medical: Not on file    Non-medical: Not on file  Tobacco Use  . Smoking status: Current Every Day Smoker    Packs/day: 0.50    Years: 5.00    Pack years: 2.50    Types: Cigarettes  . Smokeless tobacco: Never Used  . Tobacco comment: cutting back  Substance and Sexual Activity  . Alcohol use: Yes  . Drug use: No  . Sexual activity: Yes    Partners: Male    Birth control/protection: Condom    Comment: irregular condom use; educated  Lifestyle  . Physical activity:    Days per week: Not on file  Minutes per session: Not on file  . Stress: Not on file  Relationships  . Social connections:    Talks on phone: Not on file    Gets together: Not on file    Attends religious service: Not on file    Active member of club or organization: Not on file    Attends meetings of clubs or organizations: Not on file    Relationship status: Not on file  Other Topics Concern  . Not on file  Social History Narrative  . Not on file   Additional Social History: He lives at home with his grandmother. He reports that she is his legal guardian. He is unemployed. He receives disability.     Allergies:   Allergies  Allergen Reactions  . Acyclovir And Related Itching    Labs:  Results for orders placed or performed during the hospital encounter of 01/08/19 (from the past 48 hour(s))  Magnesium     Status: None   Collection Time: 01/08/19  8:30 AM  Result Value Ref Range   Magnesium 1.9 1.7 - 2.4 mg/dL    Comment: Performed at [facility], [address], [place], [name] [address]  Protime-INR      Status: Abnormal   Collection Time: 01/08/19  8:30 AM  Result Value Ref Range   Prothrombin Time 16.4 (H) 11.4 - 15.2 seconds   INR 1.34     Comment: Performed at [facility], [address] [place], [name] [address]  APTT     Status: Abnormal   Collection Time: 01/08/19  8:30 AM  Result Value Ref Range   aPTT 43 (H) 24 - 36 seconds    Comment:        IF BASELINE aPTT IS ELEVATED, SUGGEST PATIENT RISK ASSESSMENT BE USED TO DETERMINE APPROPRIATE ANTICOAGULANT THERAPY. Performed at [facility], [address] [place], [name] [address]   Culture, blood (routine x 2)     Status: None (Preliminary result)   Collection Time: 01/08/19  8:33 AM  Result Value Ref Range   Specimen Description BLOOD RIGHT ANTECUBITAL    Special Requests      BOTTLES DRAWN AEROBIC AND ANAEROBIC Blood Culture adequate volume   Culture      NO GROWTH 1 DAY Performed at [facility], [address] [place], [name] [address]    Report Status PENDING   Culture, blood (routine x 2)     Status: None (Preliminary result)   Collection Time: 01/08/19  8:37 AM  Result Value Ref Range   Specimen Description BLOOD RIGHT HAND    Special Requests      BOTTLES DRAWN AEROBIC AND ANAEROBIC Blood Culture adequate volume   Culture      NO GROWTH 1 DAY Performed at [facility], [address], [place], [name] [address]    Report Status PENDING   CSF cell count with differential collection tube #: 1     Status: Abnormal   Collection Time: 01/08/19 11:53 AM  Result Value Ref Range   Tube # 1    Color, CSF YELLOW (A) COLORLESS   Appearance, CSF CLEAR CLEAR   Supernatant XANTHOCHROMIC    RBC Count, CSF 6 (H) 0 /cu mm   WBC, CSF 0 0 - 5 /cu mm   Lymphs, CSF RARE 40 - 80 %    Comment: Performed at [facility], [address] [place], [name] [address]  CSF  cell count with differential collection tube #: 4     Status: Abnormal   Collection Time: 01/08/19 11:53 AM  Result Value  Ref Range   Tube # 4    Color, CSF YELLOW (A) COLORLESS   Appearance, CSF CLEAR CLEAR   Supernatant XANTHOCHROMIC    RBC Count, CSF 1 (H) 0 /cu mm   WBC, CSF 2 0 - 5 /cu mm   Lymphs, CSF RARE 40 - 80 %    Comment: Performed at [facility] Lab, [address], [place], [name] [address]  CSF culture     Status: None (Preliminary result)   Collection Time: 01/08/19 11:53 AM  Result Value Ref Range   Specimen Description CSF    Special Requests Immunocompromised    Gram Stain      CYTOSPIN SMEAR WBC PRESENT, PREDOMINANTLY MONONUCLEAR NO ORGANISMS SEEN    Culture      NO GROWTH < 24 HOURS Performed at [facility], [address], [place], [name] [address]    Report Status PENDING   Glucose, CSF     Status: None   Collection Time: 01/08/19 11:53 AM  Result Value Ref Range   Glucose, CSF 50 40 - 70 mg/dL    Comment: Performed at [facility], [address] [place], [name] [address]  Protein, CSF     Status: None   Collection Time: 01/08/19 11:53 AM  Result Value Ref Range   Total  Protein, CSF 42 15 - 45 mg/dL    Comment: Performed at [facility], [address], [place], [name] [address]  Cryptococcal antigen, CSF     Status: Abnormal   Collection Time: 01/08/19 11:53 AM  Result Value Ref Range   Crypto Ag POSITIVE (A) NEGATIVE    Comment: CRITICAL RESULT CALLED TO, READ BACK BY AND VERIFIED WITH: RN Vale Haven [id number]    Cryptococcal Ag Titer 10 (A) NOT INDICATED    Comment: Performed at [facility], [address] [place], [name] [address]  Fungus Culture With Stain     Status: None (Preliminary result)   Collection Time: 01/08/19 11:53 AM  Result Value Ref Range   Fungus Stain PENDING    Fungus (Mycology) Culture PENDING    Fungal Source CSF     Comment: Performed at [facility], [address] [place], [name] [address]  Cryptococcal antigen     Status: Abnormal   Collection Time: 01/08/19  1:27 PM  Result Value Ref  Range   Crypto Ag POSITIVE (A) NEGATIVE    Comment: CRITICAL RESULT CALLED TO, READ BACK BY AND VERIFIED WITH: Vale Haven RN 15:15 01/08/19 ([email or web address])    Cryptococcal Ag Titer 640 (A) NOT INDICATED    Comment: Performed at [facility] Lab, [address], [place], [name] [address]  Urinalysis, Routine w reflex microscopic     Status: Abnormal   Collection Time: 01/08/19  3:11 PM  Result Value Ref Range   Color, Urine AMBER (A) YELLOW    Comment: BIOCHEMICALS MAY BE AFFECTED BY COLOR   APPearance CLEAR CLEAR   Specific Gravity, Urine 1.027 1.005 - 1.030   pH 7.0 5.0 - 8.0   Glucose, UA NEGATIVE NEGATIVE mg/dL   Hgb urine dipstick NEGATIVE NEGATIVE   Bilirubin Urine MODERATE (A) NEGATIVE   Ketones, ur NEGATIVE NEGATIVE mg/dL   Protein, ur NEGATIVE NEGATIVE mg/dL   Nitrite NEGATIVE NEGATIVE   Leukocytes,  UA NEGATIVE NEGATIVE    Comment: Performed at [facility] Lab, [address], [place], [name] [address]  Basic metabolic panel     Status: Abnormal   Collection Time: 01/08/19  8:06 PM  Result Value Ref Range   Sodium 136 135 - 145 mmol/L   Potassium 2.7 (LL) 3.5 - 5.1 mmol/L    Comment: CRITICAL RESULT CALLED TO, READ BACK BY AND VERIFIED WITH: PLUMMER M,RN 01/08/19 2111 WAYK    Chloride 108 98 - 111 mmol/L   CO2 21 (L) 22 - 32 mmol/L   Glucose, Bld 102 (H) 70 - 99 mg/dL   BUN 7 6 - 20 mg/dL   Creatinine, Ser 6.04 (H) 0.61 - 1.24 mg/dL   Calcium 8.4 (L) 8.9 - 10.3 mg/dL   GFR calc non Af Amer >60 >60 mL/min   GFR calc Af Amer >60 >60 mL/min   Anion gap 7 5 - 15    Comment: Performed at [facility] Lab, [address] [place], [name] [address]  Urine rapid drug screen (hosp performed)     Status: None   Collection Time: 01/08/19  8:58 PM  Result Value Ref Range   Opiates NONE DETECTED NONE DETECTED   Cocaine NONE DETECTED NONE DETECTED   Benzodiazepines NONE DETECTED NONE DETECTED   Amphetamines NONE DETECTED NONE DETECTED   Tetrahydrocannabinol NONE DETECTED  NONE DETECTED   Barbiturates NONE DETECTED NONE DETECTED    Comment: (NOTE) DRUG SCREEN FOR MEDICAL PURPOSES ONLY.  IF CONFIRMATION IS NEEDED FOR ANY PURPOSE, NOTIFY LAB WITHIN 5 DAYS. LOWEST DETECTABLE LIMITS FOR URINE DRUG SCREEN Drug Class                     Cutoff (ng/mL) Amphetamine and metabolites    1000 Barbiturate and metabolites    200 Benzodiazepine                 200 Tricyclics and metabolites     300 Opiates and metabolites        300 Cocaine and metabolites        300 THC                            50 Performed at [facility], [address], [place], [name] [address]   Ammonia     Status: Abnormal   Collection Time: 01/08/19  9:14 PM  Result Value Ref Range   Ammonia 117 (H) 9 - 35 umol/L    Comment: Performed at [facility], [address] [place], [name] [address]  Comprehensive metabolic panel     Status: Abnormal   Collection Time: 01/08/19  9:14 PM  Result Value Ref Range   Sodium 136 135 - 145 mmol/L   Potassium 3.1 (L) 3.5 - 5.1 mmol/L   Chloride 110 98 - 111 mmol/L   CO2 19 (L) 22 - 32 mmol/L   Glucose, Bld 102 (H) 70 - 99 mg/dL   BUN 8 6 - 20 mg/dL   Creatinine, Ser 6.21 (H) 0.61 - 1.24 mg/dL   Calcium 8.3 (L) 8.9 - 10.3 mg/dL   Total Protein 9.1 (H) 6.5 - 8.1 g/dL   Albumin 1.9 (L) 3.5 - 5.0 g/dL   AST 308 (H) 15 - 41 U/L   ALT 118 (H) 0 - 44 U/L   Alkaline Phosphatase 184 (H) 38 - 126 U/L   Total Bilirubin 21.4 (  HH) 0.3 - 1.2 mg/dL    Comment: CRITICAL RESULT CALLED TO, READ BACK BY AND VERIFIED WITH: PLUMMER M,RN 01/08/19 2213 WAYK    GFR calc non Af Amer >60 >60 mL/min   GFR calc Af Amer >60 >60 mL/min   Anion gap 7 5 - 15    Comment: Performed at [facility] Lab, [address] [place], [name] [address]  CBC     Status: Abnormal   Collection Time: 01/09/19  5:00 AM  Result Value Ref Range   WBC 5.0 4.0 - 10.5 K/uL   RBC 4.06 (L) 4.22 - 5.81 MIL/uL   Hemoglobin 12.5 (L) 13.0 - 17.0 g/dL   HCT 19.1 (L) 47.8 -  52.0 %   MCV 84.5 80.0 - 100.0 fL   MCH 30.8 26.0 - 34.0 pg   MCHC 36.4 (H) 30.0 - 36.0 g/dL   RDW 29.5 (H) 62.1 - 30.8 %   Platelets 156 150 - 400 K/uL   nRBC 0.0 0.0 - 0.2 %    Comment: Performed at [facility], [address], [place], [name] [address]  Comprehensive metabolic panel     Status: Abnormal   Collection Time: 01/09/19  5:00 AM  Result Value Ref Range   Sodium 136 135 - 145 mmol/L   Potassium 2.8 (L) 3.5 - 5.1 mmol/L   Chloride 108 98 - 111 mmol/L   CO2 20 (L) 22 - 32 mmol/L   Glucose, Bld 107 (H) 70 - 99 mg/dL   BUN 8 6 - 20 mg/dL   Creatinine, Ser 6.96 (H) 0.61 - 1.24 mg/dL   Calcium 8.8 (L) 8.9 - 10.3 mg/dL   Total Protein 9.5 (H) 6.5 - 8.1 g/dL   Albumin 1.9 (L) 3.5 - 5.0 g/dL   AST 295 (H) 15 - 41 U/L   ALT 119 (H) 0 - 44 U/L   Alkaline Phosphatase 190 (H) 38 - 126 U/L   Total Bilirubin 22.0 (HH) 0.3 - 1.2 mg/dL    Comment: CRITICAL VALUE NOTED.  VALUE IS CONSISTENT WITH PREVIOUSLY REPORTED AND CALLED VALUE.   GFR calc non Af Amer >60 >60 mL/min   GFR calc Af Amer >60 >60 mL/min   Anion gap 8 5 - 15    Comment: Performed at [facility] Lab, [address] [place], [name] [address]    Current Facility-Administered Medications  Medication Dose Route Frequency Provider Last Rate Last Dose  . acetaminophen (TYLENOL) tablet 650 mg  650 mg Oral Q6H PRN Oralia Manis, DO   650 mg at 01/09/19 1000   Or  . acetaminophen (TYLENOL) suppository 650 mg  650 mg Rectal Q6H PRN Oralia Manis, DO      . dolutegravir (TIVICAY) tablet 50 mg  50 mg Oral Daily Gardiner Barefoot, MD   50 mg at 01/09/19 1001  . emtricitabine-rilpivir-tenofovir AF (ODEFSEY) 200-25-25 MG per tablet 1 tablet  1 tablet Oral Q breakfast Hensel, Santiago Bumpers, MD      . levETIRAcetam (KEPPRA) tablet 1,000 mg  1,000 mg Oral BID Darin Engels, Sherin, DO   1,000 mg at 01/09/19 1001  . lidocaine (LIDODERM) 5 % 1 patch  1 patch Transdermal Q24H Meccariello, Solmon Ice, DO   1 patch at 01/09/19 0401  .  magnesium sulfate IVPB 2 g 50 mL  2 g Intravenous Once Mullis, Kiersten P, DO      . nicotine (NICODERM CQ - dosed in mg/24 hours) patch 14 mg  14 mg  Transdermal Daily Oralia Manisbraham, Sherin, DO   14 mg at 01/08/19 2059  . polyethylene glycol (MIRALAX / GLYCOLAX) packet 17 g  17 g Oral Daily PRN Oralia ManisAbraham, Sherin, DO      . QUEtiapine (SEROQUEL) tablet 25 mg  25 mg Oral BID PRN Oralia ManisAbraham, Sherin, DO   25 mg at 01/09/19 0232  . sulfamethoxazole-trimethoprim (BACTRIM DS,SEPTRA DS) 800-160 MG per tablet 1 tablet  1 tablet Oral Daily Oralia ManisAbraham, Sherin, DO   1 tablet at 01/09/19 1001  . valACYclovir (VALTREX) tablet 1,000 mg  1,000 mg Oral Q M,W,F Oralia Manisbraham, Sherin, DO   1,000 mg at 01/08/19 2129    Musculoskeletal: Strength & Muscle Tone: within normal limits Gait & Station: UTA since patient is lying in bed. Patient leans: N/A  Psychiatric Specialty Exam: Physical Exam  Nursing note and vitals reviewed. Constitutional: He is oriented to person, place, and time. He appears well-developed and well-nourished.  HENT:  Head: Normocephalic and atraumatic.  Neck: Normal range of motion.  Respiratory: Effort normal.  Musculoskeletal: Normal range of motion.  Neurological: He is alert and oriented to person, place, and time.  Psychiatric: He has a normal mood and affect. His speech is normal and behavior is normal. Judgment and thought content normal. Cognition and memory are impaired.    Review of Systems  Cardiovascular: Negative for chest pain.  Gastrointestinal: Negative for abdominal pain, constipation, diarrhea, nausea and vomiting.  Musculoskeletal: Positive for back pain.  Psychiatric/Behavioral: Negative for depression, hallucinations and suicidal ideas. The patient is not nervous/anxious.   All other systems reviewed and are negative.   Blood pressure 114/67, pulse 86, temperature 98.1 F (36.7 C), temperature source Oral, resp. rate 18, height 6\' 1"  (1.854 m), weight 80.5 kg, SpO2 97 %.Body mass  index is 23.41 kg/m.  General Appearance: Fairly Groomed, young, African American male, wearing a hospital gown with hair locs who is lying in bed. NAD.   Eye Contact:  Good  Speech:  Clear and Coherent and Normal Rate  Volume:  Normal  Mood:  Euthymic  Affect:  Constricted  Thought Process:  Goal Directed, Linear and Descriptions of Associations: Intact  Orientation:  Full (Time, Place, and Person)  Thought Content:  Logical  Suicidal Thoughts:  No  Homicidal Thoughts:  No  Memory:  Immediate;   Fair Recent;   Fair Remote;   Fair  Judgement:  Impaired  Insight:  Shallow  Psychomotor Activity:  Normal  Concentration:  Concentration: Good and Attention Span: Good  Recall:  Fair  Fund of Knowledge:  Poor  Language:  Good  Akathisia:  No  Handed:  Right  AIMS (if indicated):   N/A  Assets:  ArchitectCommunication Skills Financial Resources/Insurance Housing Social Support  ADL's:  Intact  Cognition:  Impaired. He appears to have difficulty with comprehending information.   Sleep:   Okay    Assessment:  Justin Ball is a 28 y.o. male who was admitted with jaundice associated with nausea, vomiting and headache. Psychiatry was consulted for management of bipolar disorder due to agitation and capacity evaluation. He denies a history of bipolar disorder although it is documented. He lacks active psychiatric symptoms at this time. He denies SI, HI or AVH. He should follow up with his outpatient provider for medication management. He lacks capacity to make decisions regarding his current medical conditions due to lack of understanding of his medical conditions.   Treatment Plan Summary: -Continue Seroquel 25 mg BID PRN for agitation/anxiety. -Patient lacks capacity to  make decisions regarding current medical conditions. He reports that his grandmother is his legal guardian.  -EKG reviewed and QTc 498 on 1/15. Please closely monitor when starting or increasing QTc prolonging agents.   -Psychiatry will sign off on patient at this time. Please consult psychiatry again as needed.    Disposition: No evidence of imminent risk to self or others at present.   Patient does not meet criteria for psychiatric inpatient admission.  Cherly Beach, DO 01/09/2019 11:32 AM

## 2019-01-10 DIAGNOSIS — Z792 Long term (current) use of antibiotics: Secondary | ICD-10-CM

## 2019-01-10 DIAGNOSIS — F419 Anxiety disorder, unspecified: Secondary | ICD-10-CM

## 2019-01-10 LAB — COMPREHENSIVE METABOLIC PANEL
ALT: 117 U/L — ABNORMAL HIGH (ref 0–44)
AST: 416 U/L — ABNORMAL HIGH (ref 15–41)
Albumin: 1.9 g/dL — ABNORMAL LOW (ref 3.5–5.0)
Alkaline Phosphatase: 184 U/L — ABNORMAL HIGH (ref 38–126)
Anion gap: 7 (ref 5–15)
BUN: 8 mg/dL (ref 6–20)
CO2: 21 mmol/L — ABNORMAL LOW (ref 22–32)
Calcium: 8.8 mg/dL — ABNORMAL LOW (ref 8.9–10.3)
Chloride: 108 mmol/L (ref 98–111)
Creatinine, Ser: 1.66 mg/dL — ABNORMAL HIGH (ref 0.61–1.24)
GFR calc Af Amer: >60 mL/min (ref >60)
GFR calc non Af Amer: 56 mL/min — ABNORMAL LOW (ref >60)
Glucose, Bld: 82 mg/dL (ref 70–99)
Potassium: 3 mmol/L — ABNORMAL LOW (ref 3.5–5.1)
Sodium: 136 mmol/L (ref 135–145)
Total Bilirubin: 22.8 mg/dL (ref 0.3–1.2)
Total Protein: 9.6 g/dL — ABNORMAL HIGH (ref 6.5–8.1)

## 2019-01-10 LAB — HCV RNA QUANT
HCV Quantitative Log: 6.985 log10 IU/mL (ref >1.70)
HCV Quantitative: 9670000 IU/mL (ref >50)

## 2019-01-10 LAB — PROTIME-INR
INR: 1.4
Prothrombin Time: 17 seconds — ABNORMAL HIGH (ref 11.4–15.2)

## 2019-01-10 LAB — MAGNESIUM: MAGNESIUM: 2 mg/dL (ref 1.7–2.4)

## 2019-01-10 MED ORDER — POLYETHYLENE GLYCOL 3350 17 G PO PACK
17.0000 g | PACK | Freq: Every day | ORAL | Status: DC
  Administered 2019-01-10: 17 g via ORAL
  Filled 2019-01-10 (×2): qty 1

## 2019-01-10 MED ORDER — DARUNAVIR-COBICISTAT 800-150 MG PO TABS
1.0000 | ORAL_TABLET | Freq: Every day | ORAL | Status: DC
  Administered 2019-01-10 – 2019-01-11 (×2): 1 via ORAL
  Filled 2019-01-10 (×2): qty 1

## 2019-01-10 MED ORDER — DARUN-COBIC-EMTRICIT-TENOFAF 800-150-200-10 MG PO TABS: 1.0000 | ORAL_TABLET | Freq: Every day | ORAL | 2 refills | Status: DC

## 2019-01-10 MED ORDER — POTASSIUM CHLORIDE 20 MEQ PO PACK
40.0000 meq | PACK | Freq: Two times a day (BID) | ORAL | Status: AC
  Administered 2019-01-10 (×2): 40 meq via ORAL
  Filled 2019-01-10 (×2): qty 2

## 2019-01-10 MED ORDER — EMTRICITABINE-TENOFOVIR AF 200-25 MG PO TABS
1.0000 | ORAL_TABLET | Freq: Every day | ORAL | Status: DC
  Administered 2019-01-10 – 2019-01-11 (×2): 1 via ORAL
  Filled 2019-01-10 (×2): qty 1

## 2019-01-10 MED ORDER — FLUCONAZOLE 100 MG PO TABS
400.0000 mg | ORAL_TABLET | Freq: Every day | ORAL | Status: DC
  Administered 2019-01-10 – 2019-01-11 (×2): 400 mg via ORAL
  Filled 2019-01-10 (×2): qty 4

## 2019-01-10 NOTE — Progress Notes (Addendum)
[facility]   Reason for visit: Follow up on N/V  Interval History: LFTs trending down a bit, T bili remains the same.  No new complaints; hoping for more solid food.   No further n/v.    Physical Exam: Constitutional:  Vitals:   01/10/19 0537 01/10/19 1335  BP: 103/64 (!) 116/56  Pulse: 88 (!) 104  Resp: 20 20  Temp: 97.8 F (36.6 C) 98.9 F (37.2 C)  SpO2: 99% 97%   patient appears in NAD Eyes: inicteric HENT: no thrush Respiratory: Normal respiratory effort; CTA B Cardiovascular: RRR GI: soft, nt, nd  Review of Systems: Constitutional: negative for fevers and chills Gastrointestinal: negative for nausea and diarrhea Integument/breast: negative for rash  Lab Results  Component Value Date   WBC 5.0 01/09/2019   HGB 12.5 (L) 01/09/2019   HCT 34.3 (L) 01/09/2019   MCV 84.5 01/09/2019   PLT 156 01/09/2019    Lab Results  Component Value Date   CREATININE 1.66 (H) 01/10/2019   BUN 8 01/10/2019   NA 136 01/10/2019   K 3.0 (L) 01/10/2019   CL 108 01/10/2019   CO2 21 (L) 01/10/2019    Lab Results  Component Value Date   ALT 117 (H) 01/10/2019   AST 416 (H) 01/10/2019   ALKPHOS 184 (H) 01/10/2019     Microbiology: Recent Results (from the past 240 hour(s))  Culture, blood (routine x 2)     Status: None (Preliminary result)   Collection Time: 01/08/19  8:33 AM  Result Value Ref Range Status   Specimen Description BLOOD RIGHT ANTECUBITAL  Final   Special Requests   Final    BOTTLES DRAWN AEROBIC AND ANAEROBIC Blood Culture adequate volume   Culture   Final    NO GROWTH 2 DAYS Performed at [facility], [address] [place], [place] [address]    Report Status PENDING  Incomplete  Culture, blood (routine x 2)     Status: None (Preliminary result)   Collection Time: 01/08/19  8:37 AM  Result Value Ref Range Status   Specimen Description BLOOD RIGHT HAND  Final   Special Requests   Final    BOTTLES DRAWN AEROBIC AND  ANAEROBIC Blood Culture adequate volume   Culture   Final    NO GROWTH 2 DAYS Performed at [facility], [address] [place], [place] [address]    Report Status PENDING  Incomplete  Fungus Culture With Stain     Status: None (Preliminary result)   Collection Time: 01/08/19 11:48 AM  Result Value Ref Range Status   Fungus Stain Final report  Final    Comment: (NOTE) Performed At: [facility] [address] [place], [place] [id number] Jolene SchimkeNagendra Sanjai MD WG:[id number]:[phone]    Fungus (Mycology) Culture PENDING  Incomplete   Fungal Source CSF  Final    Comment: Performed at [facility], [address] [place], [place] [address]  Fungus Culture Result     Status: None   Collection Time: 01/08/19 11:48 AM  Result Value Ref Range Status   Result 1 Comment  Final    Comment: (NOTE) KOH/Calcofluor preparation:  no fungus observed. Performed At: [facility] [address] [place], [place] [id number] Jolene SchimkeNagendra Sanjai MD KG:[id number]:[phone]   CSF culture     Status: None (Preliminary result)   Collection Time: 01/08/19 11:53 AM  Result Value Ref Range Status   Specimen Description CSF  Final  Special Requests Immunocompromised  Final   Gram Stain   Final    CYTOSPIN SMEAR WBC PRESENT, PREDOMINANTLY MONONUCLEAR NO ORGANISMS SEEN    Culture   Final    NO GROWTH 2 DAYS Performed at [facility], [address], [place], [name] [address]    Report Status PENDING  Incomplete    Impression/Plan:  1. N/v - seems to be resolving.  Continue supportive care.  2.  Transaminitis - unclear etiology.  I doubt this is anything new, I suspect from stopping his ARVs (and Hep B treatment).  Continue supportive care.  3> HIV - His viral load is still pending and I suspect since it includes a genotype that it will not result for another week or two.  This would help verify if he is on his ARVs or not.   This though will not change anything clinically in the short  term. I have discussed with him and our ID team and have changed his ARVs to [place] and Tivicay which will have a better barrier to resistance and no food requirement.  He will need this prescription at discharge.  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   Dr. [name] on tomorrow

## 2019-01-10 NOTE — Progress Notes (Signed)
Subjective: Pruritis.  Objective: Vital signs in last 24 hours: Temp:  [97.4 F (36.3 C)-97.8 F (36.6 C)] 97.8 F (36.6 C) (01/17 0537) Pulse Rate:  [75-88] 88 (01/17 0537) Resp:  [18-20] 20 (01/17 0537) BP: (83-112)/(55-66) 103/64 (01/17 0537) SpO2:  [99 %-100 %] 99 % (01/17 0537) Last BM Date: (PTA)  Intake/Output from previous day: 01/16 0701 - 01/17 0700 In: 1041 [P.O.:1041] Out: -  Intake/Output this shift: No intake/output data recorded.  General appearance: alert and no distress GI: soft, non-tender; bowel sounds normal; no masses,  no organomegaly  Lab Results: Recent Labs    01/08/19 0145 01/09/19 0500  WBC 4.8 5.0  HGB 14.7 12.5*  HCT 42.5 34.3*  PLT 173 156   BMET Recent Labs    01/09/19 0500 01/09/19 1335 01/10/19 0523  NA 136 136 136  K 2.8* 2.8* 3.0*  CL 108 106 108  CO2 20* 20* 21*  GLUCOSE 107* 104* 82  BUN 8 8 8   CREATININE 1.33* 1.41* 1.66*  CALCIUM 8.8* 8.6* 8.8*   LFT Recent Labs    01/10/19 0523  PROT 9.6*  ALBUMIN 1.9*  AST 416*  ALT 117*  ALKPHOS 184*  BILITOT 22.8*   PT/INR Recent Labs    01/08/19 0830 01/10/19 0523  LABPROT 16.4* 17.0*  INR 1.34 1.40   Hepatitis Panel Recent Labs    01/08/19 0708  HEPBSAG Positive*  HCVAB >11.0*  HEPAIGM Negative  HEPBIGM Negative   C-Diff No results for input(s): CDIFFTOX in the last 72 hours. Fecal Lactopherrin No results for input(s): FECLLACTOFRN in the last 72 hours.  Studies/Results: Ct Head W Or Wo Contrast  Result Date: 01/08/2019 CLINICAL DATA:  Right-sided headache over the last 2 days. Blurred vision. HIV positive. EXAM: CT HEAD WITHOUT AND WITH CONTRAST TECHNIQUE: Contiguous axial images were obtained from the base of the skull through the vertex without and with intravenous contrast CONTRAST:  39mL OMNIPAQUE IOHEXOL 300 MG/ML  SOLN COMPARISON:  09/30/2018 FINDINGS: Brain: Mild cerebral atrophy. 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 Chronic  low-density in the right frontal lobe related to the previous right ventriculostomy catheter. No sign of acute infarction, mass lesion, hemorrhage, hydrocephalus or extra-axial collection. Vascular: No abnormal vascular finding. Skull: No significant finding. Previous burr hole right frontal region. Sinuses/Orbits: Mild maxillary sinus mucosal thickening. Orbits negative. Other: None IMPRESSION: No acute finding. Cerebellar more than cerebral atrophy. Chronic low-density in the right frontal lobe related to the previous ventriculostomy catheter. Electronically Signed   By: Paulina Fusi M.D.   On: 01/08/2019 10:42   Ct Abdomen Pelvis W Contrast  Result Date: 01/09/2019 CLINICAL DATA:  Generalized abdominal pain. EXAM: CT ABDOMEN AND PELVIS WITH CONTRAST TECHNIQUE: Multidetector CT imaging of the abdomen and pelvis was performed using the standard protocol following bolus administration of intravenous contrast. CONTRAST:  OMNIPAQUE IOHEXOL 300 MG/ML  SOLN COMPARISON:  09/28/2018 FINDINGS: Lower chest: Basilar atelectasis noted bilaterally. Hepatobiliary: No focal abnormality within the liver parenchyma. There is no evidence for gallstones, gallbladder wall thickening, or pericholecystic fluid. No intrahepatic or extrahepatic biliary dilation. Pancreas: No focal mass lesion. No dilatation of the main duct. No intraparenchymal cyst. No peripancreatic edema. Spleen: No splenomegaly. No focal mass lesion. Adrenals/Urinary Tract: No adrenal nodule or mass. Kidney unremarkable. No evidence for hydroureter. The urinary bladder appears normal for the degree of distention. Stomach/Bowel: Stomach is nondistended. No gastric wall thickening. No evidence of outlet obstruction. Duodenum is normally positioned as is the ligament of Treitz. No  small bowel wall thickening. No small bowel dilatation. The terminal ileum is normal. The appendix is normal. No gross colonic mass. Under distention of the right colon may account  for the apparent wall thickening. Vascular/Lymphatic: No abdominal aortic aneurysm. No abdominal aortic atherosclerotic calcification. Similar appearance of mild lymphadenopathy in the hepato duodenal ligament. No retroperitoneal lymphadenopathy. No pelvic sidewall lymphadenopathy. Reproductive: The prostate gland and seminal vesicles are unremarkable. Other: No intraperitoneal free fluid. Musculoskeletal: No worrisome lytic or sclerotic osseous abnormality. IMPRESSION: No new or acute findings in the abdomen or pelvis. Specifically, no findings to explain the patient's history of abdominal pain. Electronically Signed   By: Kennith Center M.D.   On: 01/09/2019 20:23    Medications:  Scheduled: . dolutegravir  50 mg Oral Daily  . emtricitabine-rilpivir-tenofovir AF  1 tablet Oral Q breakfast  . levETIRAcetam  1,000 mg Oral BID  . lidocaine  1 patch Transdermal Q24H  . nicotine  14 mg Transdermal Daily  . polyethylene glycol  17 g Oral Daily  . sulfamethoxazole-trimethoprim  1 tablet Oral Daily  . valACYclovir  1,000 mg Oral Q M,W,F   Continuous:   Assessment/Plan: 1) Elevated liver enzymes. 2) Chronic HBV. 3) Chronic HCV. 4) HIV.   The patient remains stable.  There is no evidence of any hepatic encephalopathy and his INR is at 1.4.  The CT scan of the abdomen was negative for any overt findings.  He reports having some mild pruritis, which is expected with his hyperbilirubinemia.  Plan: 1) Await the further serology results. 2) Continue with supportive care.  LOS: 1 day   Meriem Lemieux D 01/10/2019, 9:01 AM

## 2019-01-10 NOTE — Discharge Summary (Signed)
Family Medicine Teaching [facility] Discharge Summary  Patient name: Justin Ball Medical record number: [id number] Date of birth: [birth year] Age: 28 y.o. Gender: male Date of Admission: 01/08/2019  Date of Discharge: 01/11/2019 Admitting Physician: Doreene Eland, MD  Primary Care Provider: Patient, No Pcp Per Consultants: ID, GI  Indication for Hospitalization: N/V  Discharge Diagnoses/Problem List:  HIV- poorly controlled  Disposition: home with grandmother  Discharge Condition: stable, improved  Discharge Exam:  General: well nourished, well developed, NAD with non-toxic appearance HEENT: normocephalic, atraumatic, moist mucous membranes, mildly icteric Cardiovascular: regular rate and rhythm without murmurs, rubs, or gallops Lungs: clear to auscultation bilaterally with normal work of breathing Abdomen: soft, non-tender, non-distended, normoactive bowel sounds Skin: warm, dry, no rashes or lesions, cap refill < 2 seconds Extremities: warm and well perfused, normal tone, no edema Psych: euthymic mood, congruent affect  Brief Hospital Course:  Justin Ball a 27 y.o.malepresenting with 4 days of vomiting, and headache.ComplexPMH significant forchronic hepatitis B/C, HIV/AIDson ART, 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  Patient presented with multiple issues including vomiting, headache, and was found to be jaundiced.  Problems are as follows:  Headache and altered mental status ID consulted upon hospitalization due to concern for altered mental status in the setting of headache and vomiting with a history of cryptococcal meningoencephalitis.  CT head negative.  LP was performed, however culture did not grow cryptococcus.  Vomiting and headache resolved during hospitalization.  Patient's mentation appeared to be back to his baseline by discharge.  Hepatitis and jaundice RUQ  abdominal ultrasound and CT abdomen and pelvis with contrast did not reveal obstructive process.  There was note of biliary sludge based on ultrasound.  Gi believed this was secondary to untreated chronic HBV and HCV. Several studies pending including hepatitis labs and EBV.  HIV with AIDS HIV medications were transitioned to Tivicay and Symtuza due to concern that patient was not adherent to current ART.  HIV-1 RNA PCR pending upon discharge.  Acute kidney injury Patient did have an AKI throughout his hospitalization which appeared to be improving the day of his discharge.  Patient reported good urine output throughout his hospitalization.  Lack of capacity PT/OT felt patient did not require outpatient management. Was discussion about legal guardianship.  Patient was found to lack capacity based on psychiatry evaluation during hospitalization.  There was mention that grandmother, who is patient's primary caregiver, was the legal guardian.  After discussion with PCP over the phone, this was found to not be accurate.  They will need to be discussion at follow-up regarding process of obtaining proper paperwork so that grandmother can be HCPOA.  Patient stable for discharge.  Plan was discussed with PCP, Dr. Algis Liming.  Issues for Follow Up:  1. ID recommended switching ART to [place] and Tivicay.  Patient prescribed 30-day supply of both medications which were sent to [facility] on [address] per mother and patient's request. 2. Follow-up hepatitis labs and EBV.  Follow-up liver function studies. 3. Follow-up kidney function and electrolytes, did have hypokalemia earlier during hospitalization.  AKI appears to be stable and improving prior to discharge. 4. Patient lacks capacity for decision-making.  Grandmother will need complete HCPOA paperwork.  Significant Procedures: LP  Significant Labs and Imaging:  Recent Labs  Lab 01/08/19 0145 01/09/19 0500  WBC 4.8 5.0  HGB 14.7 12.5*  HCT 42.5 34.3*   PLT 173 156   Recent Labs  Lab 01/08/19 0145  01/08/19 0830  01/08/19 2114 01/09/19 0500 01/09/19 1335 01/10/19 0523 01/11/19 1039  NA 135  --    < > 136 136 136 136 132*  K 2.6*  --    < > 3.1* 2.8* 2.8* 3.0* 3.2*  CL 101  --    < > 110 108 106 108 104  CO2 24  --    < > 19* 20* 20* 21* 18*  GLUCOSE 99  --    < > 102* 107* 104* 82 94  BUN 10  --    < > 8 8 8 8 9   CREATININE 1.26*  --    < > 1.45* 1.33* 1.41* 1.66* 1.49*  CALCIUM 8.9  --    < > 8.3* 8.8* 8.6* 8.8* 9.3  MG  --  1.9  --   --   --   --  2.0  --   ALKPHOS 227*  --   --  184* 190*  --  184* 183*  AST 604*  --   --  475* 492*  --  416* 408*  ALT 142*  --   --  118* 119*  --  117* 112*  ALBUMIN 2.5*  --   --  1.9* 1.9*  --  1.9* 1.9*   < > = values in this interval not displayed.    Results/Tests Pending at Time of Discharge: HIV-1 RNA PCR, EBV  Discharge Medications:  Allergies as of 01/11/2019      Reactions   Acyclovir And Related Itching      Medication List    STOP taking these medications   emtricitabine-rilpivir-tenofovir AF 200-25-25 MG Tabs tablet Commonly known as:  ODEFSEY     TAKE these medications   acetaminophen 325 MG tablet Commonly known as:  TYLENOL Take 2 tablets (650 mg total) by mouth every 4 (four) hours as needed for mild pain (temp > 101.5).   Darunavir-Cobicisctat-Emtricitabine-Tenofovir Alafenamide 800-150-200-10 MG Tabs Commonly known as:  SYMTUZA Take 1 tablet by mouth daily with breakfast. Notes to patient:  New meds   dolutegravir 50 MG tablet Commonly known as:  TIVICAY Take 1 tablet (50 mg total) by mouth daily.   fluconazole 200 MG tablet Commonly known as:  DIFLUCAN Take 2 tablets (400 mg total) by mouth daily at 6 PM.   levETIRAcetam 1000 MG tablet Commonly known as:  KEPPRA Take 1 tablet (1,000 mg total) by mouth 2 (two) times daily.   QUEtiapine 25 MG tablet Commonly known as:  SEROQUEL Take 1 tablet (25 mg total) by mouth 2 (two) times daily as needed  (agitation).   sulfamethoxazole-trimethoprim 800-160 MG tablet Commonly known as:  BACTRIM DS,SEPTRA DS Take 1 tablet by mouth daily.   valACYclovir 1000 MG tablet Commonly known as:  VALTREX Take 1 tablet (1,000 mg total) by mouth 3 (three) times daily. What changed:  when to take this       Discharge Instructions: Please refer to Patient Instructions section of EMR for full details.  Patient was counseled important signs and symptoms that should prompt return to medical care, changes in medications, dietary instructions, activity restrictions, and follow up appointments.   Follow-Up Appointments: Follow-up Information    [facility]             . Go on 01/14/2019.   Why:  Appt is at 2:30 PM. Contact information: [address] [place] [address]  Wendee BeaversMcMullen, David J, DO 01/11/2019, 1:12 PM PGY-3, [facility]

## 2019-01-10 NOTE — Progress Notes (Signed)
Family Medicine Teaching Service Daily Progress Note Intern Pager: [phone]  Patient name: Justin Ball Medical record number: [id number] Date of birth: July 31, 1991 Age: 28 y.o. Gender: male  Primary Care Provider: Patient, No Pcp Per Consultants: ID, GI Code Status: full  Pt Overview and Major Events to Date:  1/15 admitted  Assessment and Plan: Justin Ball is a 28 y.o. male presenting with 4 days of nausea, vomiting, and headache. 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   Jaundice with associated N/V, headache  Chronic Hepatitis B and C- patient denies abdominal pain, N/V, back pain, headache. He has continued itching which he states is on his wrists where the tape is from venipuncture. I recommended he remove the tape and see if that helps. AST, ALT, Alk phos all slightly improved today. total bili has increased to 22.8 today. - Follow up pending labs - f/u GI recs (1/17)  - await labs and continue supportive care - ID consulted, will follow up recs (1/16)  -no significant signs of meningitis, continue fluconazole ppx when LFTs improve, f/u CD4 and RNA - Vitals per floor  - continuous cardiac monitoring - f/u Psychiatry recs regarding capacity, recommended by Dr. Tommy Medal on 1/14  - continue seroquel BID, pt lacks capacity and grandmother is legal guardian, monitor QTc, signed off at this time, does not meet criteria for inpatient admission - Tylenol PRN - f/u PT/OT recs - monitor PT/INR, and discontinued subQ heparin for DVT ppx  Hypokalemia- 3.0 today. Replete with 25mq x2. Mg 2.0 today - pharmacy consult for K+ replete - replete Mg++ today and recheck in am  AKI- Cr elevated to 1.66 today - follow with CMP am - avoid nephrotoxic agents - encourage PO intake  Prolonged QT: QTc 455 on EKG yesterday. NSR - consider  restarting Odefsey, Tivicay, and Fluconazole per ID - Repeat EKG in AM  HIV/AIDs on ART Home meds include Odefsey QD, Tivicay QD and prophylactic Bactrim QD. 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 He followed up with Dr. CMegan Salonwith ID on 11/28/2018 and was continued on Fluconazole and Keppra and was to follow up on 1/28 with Dr. VTommy Medal - Plan as above - hold fluconazole given prolonged QTc, appreciate ID recs - Continue home Keppra  Bipolar disorder  Agitation : On home Seroquel 223mBID PRN. Concern for lack of capacity per Dr. VaTommy MedalWill consult psychiatry.  - Seroquel PRN for agitation - Psych consulted, will follow up  Tobacco Use -nicotine patch   FEN/GI: Heart healthy diet, Miralax PRN Prophylaxis: Heparin SQ  Disposition: patient appears to be stable and would predict he can go home pending ID management. Plan to look into resources for patient to have better support outpatient and stay adherent to his medication regimens.  Subjective:  Patient comfortable in bed. He is worried that he can't find his phone. He denies headache, N/V, abdominal pain.   Objective: Temp:  [97.4 F (36.3 C)-97.8 F (36.6 C)] 97.8 F (36.6 C) (01/17 0537) Pulse Rate:  [75-88] 88 (01/17 0537) Resp:  [  18-20] 20 (01/17 0537) BP: (83-112)/(55-66) 103/64 (01/17 0537) SpO2:  [99 %-100 %] 99 % (01/17 0537) Physical Exam: General: NAD, alert, mild scleral icterus present Cardiac: RRR, normal heart sounds, no murmurs Respiratory: CTAB, normal effort, No wheezes, rales or rhonchi Abdomen: soft, nontender,  nondistended, no hepatic or splenomegaly Extremities: no edema to LEs Skin: warm and dry, mild jaundice Neuro: alert and oriented x4, no focal deficits Psych: flat mood, mildly anxious at times regarding phone  Laboratory: Recent Labs  Lab 01/08/19 0145 01/09/19 0500  WBC 4.8 5.0  HGB 14.7 12.5*  HCT 42.5 34.3*  PLT 173 156   Recent Labs  Lab 01/08/19 2114 01/09/19 0500 01/09/19 1335 01/10/19 0523  NA 136 136 136 136  K 3.1* 2.8* 2.8* 3.0*  CL 110 108 106 108  CO2 19* 20* 20* 21*  BUN '8 8 8 8  ' CREATININE 1.45* 1.33* 1.41* 1.66*  CALCIUM 8.3* 8.8* 8.6* 8.8*  PROT 9.1* 9.5*  --  9.6*  BILITOT 21.4* 22.0*  --  22.8*  ALKPHOS 184* 190*  --  184*  ALT 118* 119*  --  117*  AST 475* 492*  --  416*  GLUCOSE 102* 107* 104* 82   Lipase wnl Hepatitis panel positive Hep B surface Ag, HCV Ab, neg Hep A IgM, Hep B/C IgM HCV RNA quant pending PT 17 INR 1.4 PTT 43 yesterday Magnesium 2.0 today Blood cultures NG x2 days CD4 count 120 Ammonia 117  CSF:  HSV negative  Cryptococcal Ag positive 10-640  Fungus culture pending  Protein 42  Glucose 50  Culture NG x2 days  Cell count xanthochromic, 1-6 RBC, 0-2 WBC, rare lymphs  Urinalysis    Component Value Date/Time   COLORURINE AMBER (A) 01/08/2019 1511   APPEARANCEUR CLEAR 01/08/2019 1511   LABSPEC 1.027 01/08/2019 1511   PHURINE 7.0 01/08/2019 1511   GLUCOSEU NEGATIVE 01/08/2019 1511   HGBUR NEGATIVE 01/08/2019 1511   BILIRUBINUR MODERATE (A) 01/08/2019 1511   KETONESUR NEGATIVE 01/08/2019 1511   PROTEINUR NEGATIVE 01/08/2019 1511   UROBILINOGEN 4.0 (H) 09/09/2014 1055   NITRITE NEGATIVE 01/08/2019 1511   LEUKOCYTESUR NEGATIVE 01/08/2019 1511   Urine culture pending UDS negative  Imaging/Diagnostic Tests: Dg Chest 2 View  Result Date: 01/08/2019 CLINICAL DATA:  Vomiting and weakness EXAM: CHEST - 2 VIEW COMPARISON:  December 30, 2017 FINDINGS: There is no edema or consolidation. The heart size and pulmonary  vascularity are normal. No adenopathy. No bone lesions. IMPRESSION: No edema or consolidation. Electronically Signed   By: Lowella Grip III M.D.   On: 01/08/2019 08:56   Ct Head W Or Wo Contrast  Result Date: 01/08/2019 CLINICAL DATA:  Right-sided headache over the last 2 days. Blurred vision. HIV positive. EXAM: CT HEAD WITHOUT AND WITH CONTRAST TECHNIQUE: Contiguous axial images were obtained from the base of the skull through the vertex without and with intravenous contrast CONTRAST:  34m OMNIPAQUE IOHEXOL 300 MG/ML  SOLN COMPARISON:  09/30/2018 FINDINGS: Brain: Mild cerebral atrophy. 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 Chronic low-density in the right frontal lobe related to the previous right ventriculostomy catheter. No sign of acute infarction, mass lesion, hemorrhage, hydrocephalus or extra-axial collection. Vascular: No abnormal vascular finding. Skull: No significant finding. Previous burr hole right frontal region. Sinuses/Orbits: Mild maxillary sinus mucosal thickening. Orbits negative. Other: None IMPRESSION: No acute finding. Cerebellar more than cerebral atrophy. Chronic low-density in the right frontal lobe related to the previous ventriculostomy catheter. Electronically Signed   By: MElta Guadeloupe  Shogry M.D.   On: 01/08/2019 10:42   Ct Abdomen Pelvis W Contrast  Result Date: 01/09/2019 CLINICAL DATA:  Generalized abdominal pain. EXAM: CT ABDOMEN AND PELVIS WITH CONTRAST TECHNIQUE: Multidetector CT imaging of the abdomen and pelvis was performed using the standard protocol following bolus administration of intravenous contrast. CONTRAST:  185m OMNIPAQUE IOHEXOL 300 MG/ML  SOLN COMPARISON:  09/28/2018 FINDINGS: Lower chest: Basilar atelectasis noted bilaterally. Hepatobiliary: No focal abnormality within the liver parenchyma. There is no evidence for gallstones, gallbladder wall thickening, or pericholecystic fluid. No intrahepatic or extrahepatic biliary dilation.  Pancreas: No focal mass lesion. No dilatation of the main duct. No intraparenchymal cyst. No peripancreatic edema. Spleen: No splenomegaly. No focal mass lesion. Adrenals/Urinary Tract: No adrenal nodule or mass. Kidney unremarkable. No evidence for hydroureter. The urinary bladder appears normal for the degree of distention. Stomach/Bowel: Stomach is nondistended. No gastric wall thickening. No evidence of outlet obstruction. Duodenum is normally positioned as is the ligament of Treitz. No small bowel wall thickening. No small bowel dilatation. The terminal ileum is normal. The appendix is normal. No gross colonic mass. Under distention of the right colon may account for the apparent wall thickening. Vascular/Lymphatic: No abdominal aortic aneurysm. No abdominal aortic atherosclerotic calcification. Similar appearance of mild lymphadenopathy in the hepato duodenal ligament. No retroperitoneal lymphadenopathy. No pelvic sidewall lymphadenopathy. Reproductive: The prostate gland and seminal vesicles are unremarkable. Other: No intraperitoneal free fluid. Musculoskeletal: No worrisome lytic or sclerotic osseous abnormality. IMPRESSION: No new or acute findings in the abdomen or pelvis. Specifically, no findings to explain the patient's history of abdominal pain. Electronically Signed   By: EMisty StanleyM.D.   On: 01/09/2019 20:23   UKoreaAbdomen Limited Ruq  Result Date: 01/08/2019 CLINICAL DATA:  Right upper quadrant pain EXAM: ULTRASOUND ABDOMEN LIMITED RIGHT UPPER QUADRANT COMPARISON:  CT abdomen and pelvis September 28, 2018 FINDINGS: Gallbladder: No gallstones are appreciable on this study. There is sludge in the gallbladder. The gallbladder wall is mildly thickened. There is no pericholecystic fluid. No sonographic Murphy sign noted by sonographer. Common bile duct: Diameter: 3 mm. No intrahepatic or extrahepatic biliary duct dilatation Liver: No focal lesion identified. Within normal limits in parenchymal  echogenicity. Portal vein is patent on color Doppler imaging with normal direction of blood flow towards the liver. IMPRESSION: No gallstones are evident. However, there is sludge in the gallbladder which could potentially obscure tiny gallstones. Gallbladder wall is mildly thickened. The possibility of early cholecystitis must be of concern in this circumstance. This finding may warrant nuclear medicine hepatobiliary imaging study to assess for cystic duct patency. Study otherwise unremarkable. Electronically Signed   By: WLowella GripIII M.D.   On: 01/08/2019 08:19     ARicharda Osmond DO 01/10/2019, 7:18 AM PGY-1, CCross CityIntern pager: [phone] text pages welcome

## 2019-01-11 DIAGNOSIS — N179 Acute kidney failure, unspecified: Secondary | ICD-10-CM

## 2019-01-11 LAB — COMPREHENSIVE METABOLIC PANEL
ALT: 112 U/L — ABNORMAL HIGH (ref 0–44)
AST: 408 U/L — ABNORMAL HIGH (ref 15–41)
Albumin: 1.9 g/dL — ABNORMAL LOW (ref 3.5–5.0)
Alkaline Phosphatase: 183 U/L — ABNORMAL HIGH (ref 38–126)
Anion gap: 10 (ref 5–15)
BUN: 9 mg/dL (ref 6–20)
CO2: 18 mmol/L — ABNORMAL LOW (ref 22–32)
Calcium: 9.3 mg/dL (ref 8.9–10.3)
Chloride: 104 mmol/L (ref 98–111)
Creatinine, Ser: 1.49 mg/dL — ABNORMAL HIGH (ref 0.61–1.24)
GFR calc Af Amer: >60 mL/min (ref >60)
GFR calc non Af Amer: >60 mL/min (ref >60)
Glucose, Bld: 94 mg/dL (ref 70–99)
Potassium: 3.2 mmol/L — ABNORMAL LOW (ref 3.5–5.1)
Sodium: 132 mmol/L — ABNORMAL LOW (ref 135–145)
Total Bilirubin: 22.3 mg/dL (ref 0.3–1.2)
Total Protein: 9.3 g/dL — ABNORMAL HIGH (ref 6.5–8.1)

## 2019-01-11 LAB — HEPATITIS B DNA, ULTRAQUANTITATIVE, PCR
HBV DNA SERPL PCR-ACNC: 20 IU/mL
HBV DNA SERPL PCR-LOG IU: 1.301 log10 IU/mL

## 2019-01-11 LAB — HEPATITIS B E ANTIGEN: HEP B E AG: POSITIVE — AB

## 2019-01-11 MED ORDER — DARUN-COBIC-EMTRICIT-TENOFAF 800-150-200-10 MG PO TABS: 1.0000 | ORAL_TABLET | Freq: Every day | ORAL | 0 refills | Status: DC

## 2019-01-11 MED ORDER — DOLUTEGRAVIR SODIUM 50 MG PO TABS: 50.0000 mg | ORAL_TABLET | Freq: Every day | ORAL | 0 refills | Status: DC

## 2019-01-11 NOTE — Progress Notes (Signed)
Pt ready to discharge. Grandmother at [facility]. Discussed new medication and meds scheduled  to be given after discharge.  Discharge (AVS) papers given to Grandmother  Pt alert and denies pain. Pt in wheelchair and pushed out by grandmother and uncle

## 2019-01-11 NOTE — Care Management (Addendum)
Text paged Dr Daiva EvesVan Dam to discuss DC meds. Patient can fill meds through Medicaid coverage per Dr Daiva EvesVan Dam. ID clinic [facility] so [facility] can be easily [facility]. MD states that psychology has deemed him incompetent, however he does not have a clearly defined legal guardian appointed. His grandmother is his next of kin.  DC on hold pending improvement in creatinine.

## 2019-01-11 NOTE — Discharge Instructions (Signed)
Follow-up with your scheduled appointment with the infectious disease center on Tuesday at 2:30 PM.    We will begin 2 new medications called Symtuza and Tivicay.  These do not need to be taken with food. Please pick them up at [facility] on [facility].  Please discontinue the Descovy and Odefsey.  All other medications he is to resume.

## 2019-01-11 NOTE — Plan of Care (Signed)
  Problem: Education: Goal: Knowledge of General Education information will improve Description Including pain rating scale, medication(s)/side effects and non-pharmacologic comfort measures Outcome: Completed/Met   Problem: Health Behavior/Discharge Planning: Goal: Ability to manage health-related needs will improve Outcome: Completed/Met   Problem: Clinical Measurements: Goal: Ability to maintain clinical measurements within normal limits will improve Outcome: Completed/Met Goal: Will remain free from infection Outcome: Completed/Met Goal: Diagnostic test results will improve Outcome: Completed/Met Goal: Respiratory complications will improve Outcome: Completed/Met Goal: Cardiovascular complication will be avoided Outcome: Completed/Met   Problem: Activity: Goal: Risk for activity intolerance will decrease Outcome: Completed/Met   Problem: Nutrition: Goal: Adequate nutrition will be maintained Outcome: Completed/Met   Problem: Elimination: Goal: Will not experience complications related to bowel motility Outcome: Completed/Met Goal: Will not experience complications related to urinary retention Outcome: Completed/Met   Problem: Safety: Goal: Ability to remain free from injury will improve Outcome: Completed/Met

## 2019-01-11 NOTE — Progress Notes (Signed)
Subjective:  "When someone going to give me something for this hole in my back?  They tell me they are sent me home because the hospitalist to full"   Antibiotics:  Anti-infectives (From admission, onward)   Start     Dose/Rate Route Frequency Ordered Stop   01/11/19 0000  dolutegravir (TIVICAY) 50 MG tablet     50 mg Oral Daily 01/11/19 1003     01/10/19 1230  fluconazole (DIFLUCAN) tablet 400 mg     400 mg Oral Daily 01/10/19 1055     01/10/19 1030  emtricitabine-tenofovir AF (DESCOVY) 200-25 MG per tablet 1 tablet     1 tablet Oral Daily 01/10/19 0943     01/10/19 1030  darunavir-cobicistat (PREZCOBIX) 800-150 MG per tablet 1 tablet     1 tablet Oral Daily with breakfast 01/10/19 0943     01/10/19 0800  emtricitabine-rilpivir-tenofovir AF (ODEFSEY) 200-25-25 MG per tablet 1 tablet  Status:  Discontinued     1 tablet Oral Daily with breakfast 01/09/19 0949 01/09/19 1056   01/10/19 0000  Darunavir-Cobicisctat-Emtricitabine-Tenofovir Alafenamide (SYMTUZA) 800-150-200-10 MG TABS     1 tablet Oral Daily with breakfast 01/10/19 1645     01/09/19 1200  emtricitabine-rilpivir-tenofovir AF (ODEFSEY) 200-25-25 MG per tablet 1 tablet  Status:  Discontinued     1 tablet Oral Daily with breakfast 01/09/19 1056 01/10/19 0943   01/09/19 1000  dolutegravir (TIVICAY) tablet 50 mg     50 mg Oral Daily 01/09/19 0949     01/08/19 2100  sulfamethoxazole-trimethoprim (BACTRIM DS,SEPTRA DS) 800-160 MG per tablet 1 tablet     1 tablet Oral Daily 01/08/19 2057     01/08/19 2100  valACYclovir (VALTREX) tablet 1,000 mg     1,000 mg Oral Every M-W-F 01/08/19 2057        Medications: Scheduled Meds: . darunavir-cobicistat  1 tablet Oral Q breakfast  . dolutegravir  50 mg Oral Daily  . emtricitabine-tenofovir AF  1 tablet Oral Daily  . fluconazole  400 mg Oral Daily  . levETIRAcetam  1,000 mg Oral BID  . lidocaine  1 patch Transdermal Q24H  . nicotine  14 mg Transdermal Daily  .  polyethylene glycol  17 g Oral Daily  . sulfamethoxazole-trimethoprim  1 tablet Oral Daily  . valACYclovir  1,000 mg Oral Q M,W,F   Continuous Infusions: PRN Meds:.acetaminophen **OR** acetaminophen, oxyCODONE, QUEtiapine    Objective: Weight change:  No intake or output data in the 24 hours ending 01/11/19 1042 Blood pressure (!) 93/48, pulse 99, temperature 99 F (37.2 C), temperature source Oral, resp. rate 14, height 6\' 1"  (1.854 m), weight 80.5 kg, SpO2 97 %. Temp:  [98.9 F (37.2 C)-99 F (37.2 C)] 99 F (37.2 C) (01/18 0442) Pulse Rate:  [88-104] 99 (01/18 0442) Resp:  [14-20] 14 (01/18 0442) BP: (93-116)/(48-60) 93/48 (01/18 0442) SpO2:  [97 %-99 %] 97 % (01/18 0442)  Physical Exam: General: Alert and awake, oriented person and place and recognized me HEENT: Icteric sclera, EOMI CVS regular rate, normal no murmurs gallops or rubs Chest: , no wheezing, no respiratory distress clear to auscultation bilaterally Abdomen: soft non-distended, nontender no rebound Extremities: no edema or deformity noted bilaterally Skin: He has diffuse rash on his extremities Neuro: nonfocal  CBC:    BMET Recent Labs    01/09/19 1335 01/10/19 0523  NA 136 136  K 2.8* 3.0*  CL 106 108  CO2 20* 21*  GLUCOSE 104* 82  BUN 8 8  CREATININE 1.41* 1.66*  CALCIUM 8.6* 8.8*     Liver Panel  Recent Labs    01/09/19 0500 01/10/19 0523  PROT 9.5* 9.6*  ALBUMIN 1.9* 1.9*  AST 492* 416*  ALT 119* 117*  ALKPHOS 190* 184*  BILITOT 22.0* 22.8*       Sedimentation Rate No results for input(s): ESRSEDRATE in the last 72 hours. C-Reactive Protein No results for input(s): CRP in the last 72 hours.  Micro Results: Recent Results (from the past 720 hour(s))  Culture, blood (routine x 2)     Status: None (Preliminary result)   Collection Time: 01/08/19  8:33 AM  Result Value Ref Range Status   Specimen Description BLOOD RIGHT ANTECUBITAL  Final   Special Requests   Final     BOTTLES DRAWN AEROBIC AND ANAEROBIC Blood Culture adequate volume   Culture   Final    NO GROWTH 2 DAYS Performed at [facility], [address] [place], [place] [address]    Report Status PENDING  Incomplete  Culture, blood (routine x 2)     Status: None (Preliminary result)   Collection Time: 01/08/19  8:37 AM  Result Value Ref Range Status   Specimen Description BLOOD RIGHT HAND  Final   Special Requests   Final    BOTTLES DRAWN AEROBIC AND ANAEROBIC Blood Culture adequate volume   Culture   Final    NO GROWTH 2 DAYS Performed at [facility], [address] [place], [place] [address]    Report Status PENDING  Incomplete  Fungus Culture With Stain     Status: None (Preliminary result)   Collection Time: 01/08/19 11:48 AM  Result Value Ref Range Status   Fungus Stain Final report  Final    Comment: (NOTE) Performed At: [facility] [address] [place], [place] [id number] Jolene SchimkeNagendra Sanjai MD WG:[id number]:[phone]    Fungus (Mycology) Culture PENDING  Incomplete   Fungal Source CSF  Final    Comment: Performed at [facility], [address] [place], [place] [address]  Fungus Culture Result     Status: None   Collection Time: 01/08/19 11:48 AM  Result Value Ref Range Status   Result 1 Comment  Final    Comment: (NOTE) KOH/Calcofluor preparation:  no fungus observed. Performed At: [facility] [address] [place], [place] [id number] Jolene SchimkeNagendra Sanjai MD KG:[id number]:[phone]   CSF culture     Status: None (Preliminary result)   Collection Time: 01/08/19 11:53 AM  Result Value Ref Range Status   Specimen Description CSF  Final   Special Requests Immunocompromised  Final   Gram Stain   Final    CYTOSPIN SMEAR WBC PRESENT, PREDOMINANTLY MONONUCLEAR NO ORGANISMS SEEN    Culture   Final    NO GROWTH 3 DAYS Performed at [facility], [address] [place], [place] [address]    Report Status PENDING  Incomplete    Studies/Results: Ct  Abdomen Pelvis W Contrast  Result Date: 01/09/2019 CLINICAL DATA:  Generalized abdominal pain. EXAM: CT ABDOMEN AND PELVIS WITH CONTRAST TECHNIQUE: Multidetector CT imaging of the abdomen and pelvis was performed using the standard protocol following bolus administration of intravenous contrast. CONTRAST:  100mL OMNIPAQUE IOHEXOL 300 MG/ML  SOLN COMPARISON:  09/28/2018 FINDINGS: Lower chest: Basilar atelectasis noted bilaterally. Hepatobiliary: No focal abnormality within the liver parenchyma. There is no evidence for gallstones, gallbladder wall thickening, or pericholecystic fluid. No intrahepatic or extrahepatic biliary  dilation. Pancreas: No focal mass lesion. No dilatation of the main duct. No intraparenchymal cyst. No peripancreatic edema. Spleen: No splenomegaly. No focal mass lesion. Adrenals/Urinary Tract: No adrenal nodule or mass. Kidney unremarkable. No evidence for hydroureter. The urinary bladder appears normal for the degree of distention. Stomach/Bowel: Stomach is nondistended. No gastric wall thickening. No evidence of outlet obstruction. Duodenum is normally positioned as is the ligament of Treitz. No small bowel wall thickening. No small bowel dilatation. The terminal ileum is normal. The appendix is normal. No gross colonic mass. Under distention of the right colon may account for the apparent wall thickening. Vascular/Lymphatic: No abdominal aortic aneurysm. No abdominal aortic atherosclerotic calcification. Similar appearance of mild lymphadenopathy in the hepato duodenal ligament. No retroperitoneal lymphadenopathy. No pelvic sidewall lymphadenopathy. Reproductive: The prostate gland and seminal vesicles are unremarkable. Other: No intraperitoneal free fluid. Musculoskeletal: No worrisome lytic or sclerotic osseous abnormality. IMPRESSION: No new or acute findings in the abdomen or pelvis. Specifically, no findings to explain the patient's history of abdominal pain. Electronically Signed    By: Kennith CenterEric  Mansell M.D.   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  He does not have competency to take care of himself due to his CNS injury which I do not think he will recover.  He has comorbid hepatitis C and hepatitis B.  He was admitted to the hospital with blurry vision and nausea and vomiting and underwent lumbar puncture that showed positive cryptococcal antigen though no cryptococcus has grown.  His liver function tests are also abruptly worse the cause not clear.  Is been presumed that his liver function test may be worse due to him not being on his antivirals and having a flare of hepatitis B but we do not know if that is the case or not because we do not have a viral load yet.  He was getting set to be discharged but I noticed that his creatinine had bumped up abruptly to 1.66 between Thursday and Friday.  1.  Transaminitis: Not clear what is going on here.  We know that meds would not fill them was a long pharmacy since October but we also know that in the past [name] had left with a boyfriend and it is possible that his grandmother potentially could have stockpiled antivirals and had plenty of them to give to  him when he returned.  As mentioned he has comorbid hep B and hepatitis C that we had not started treating his hep C yet.  2.  HIV and AIDS: Not clear whether he has been on antivirals or not his grandmother read me the names of his medications  and said that she had received a new shipment recently.  Dr. Luciana Axe is change the regimen to [facility] and Symtuza (though latter in component parts in house)   for higher barrier to resistance without the food requirement that rilpivirine has.  #3 acute kidney injury: I suspect this is due to nausea and vomiting and prerenal failure.  I am not terribly worried about the TAF version of TNF which has been studied down to GFR of 15. However if Cr worsens significantly we could take that part of his regimen away while awaiting normalization of his renal function.  I personally think though he needs more hydration first  #4  Cryptococcal meningitis continue fluconazole: His presentation would make more sense with an immune reconstitution with him being off antivirals and resuming them which might also explain his hepatitis flare it might rather than a hep B flare off of antivirals represent an immune reconstitution syndrome to resuming antivirals  #5 competency: The patient is not competent to make medical sessions note his grandmother does the best job taking care of her him and I think she should be appointed his legal guardian but no one has gone through the process of appointing her legal guardian.  I spent greater than 35  minutes with the patient including greater than 50% of time in face to face counsel his grandmother on the phone of the patient and in coordination of his care with the family medicine team and case management and nursing.  LOS: 2 days   Acey Lav 01/11/2019, 10:42 AM

## 2019-01-12 LAB — CSF CULTURE W GRAM STAIN: Culture: NO GROWTH

## 2019-01-12 LAB — HEPATITIS B E ANTIBODY: Hep B E Ab: NEGATIVE

## 2019-01-13 LAB — CULTURE, BLOOD (ROUTINE X 2)
Culture: NO GROWTH
Culture: NO GROWTH
Special Requests: ADEQUATE
Special Requests: ADEQUATE

## 2019-01-14 ENCOUNTER — Ambulatory Visit (INDEPENDENT_AMBULATORY_CARE_PROVIDER_SITE_OTHER): Payer: Medicaid Other | Admitting: Infectious Disease

## 2019-01-14 ENCOUNTER — Encounter: Payer: Self-pay | Admitting: Infectious Disease

## 2019-01-14 ENCOUNTER — Other Ambulatory Visit (HOSPITAL_COMMUNITY)
Admission: RE | Admit: 2019-01-14 | Discharge: 2019-01-14 | Disposition: A | Discharge status: Home / Self Care | Payer: Medicaid Other | Source: Ambulatory Visit | Attending: Infectious Disease | Admitting: Infectious Disease

## 2019-01-14 VITALS — BP 123/77 | HR 99 | Temp 98.4°F | Wt 182.0 lb

## 2019-01-14 DIAGNOSIS — G40909 Epilepsy, unspecified, not intractable, without status epilepticus: Secondary | ICD-10-CM | POA: Diagnosis not present

## 2019-01-14 DIAGNOSIS — N179 Acute kidney failure, unspecified: Secondary | ICD-10-CM | POA: Diagnosis not present

## 2019-01-14 DIAGNOSIS — B182 Chronic viral hepatitis C: Secondary | ICD-10-CM | POA: Diagnosis not present

## 2019-01-14 DIAGNOSIS — B451 Cerebral cryptococcosis: Secondary | ICD-10-CM | POA: Diagnosis not present

## 2019-01-14 DIAGNOSIS — Z9114 Patient's other noncompliance with medication regimen: Secondary | ICD-10-CM | POA: Diagnosis not present

## 2019-01-14 DIAGNOSIS — R9431 Abnormal electrocardiogram [ECG] [EKG]: Secondary | ICD-10-CM

## 2019-01-14 DIAGNOSIS — R112 Nausea with vomiting, unspecified: Secondary | ICD-10-CM

## 2019-01-14 DIAGNOSIS — B180 Chronic viral hepatitis B with delta-agent: Secondary | ICD-10-CM | POA: Diagnosis not present

## 2019-01-14 DIAGNOSIS — B20 Human immunodeficiency virus [HIV] disease: Secondary | ICD-10-CM | POA: Diagnosis not present

## 2019-01-14 DIAGNOSIS — F319 Bipolar disorder, unspecified: Secondary | ICD-10-CM | POA: Diagnosis not present

## 2019-01-14 DIAGNOSIS — Z982 Presence of cerebrospinal fluid drainage device: Secondary | ICD-10-CM | POA: Diagnosis not present

## 2019-01-14 DIAGNOSIS — K7010 Alcoholic hepatitis without ascites: Secondary | ICD-10-CM | POA: Diagnosis not present

## 2019-01-14 DIAGNOSIS — Z91148 Patient's other noncompliance with medication regimen for other reason: Secondary | ICD-10-CM

## 2019-01-14 HISTORY — DX: Alcoholic hepatitis without ascites: K70.10

## 2019-01-14 HISTORY — DX: Nausea with vomiting, unspecified: R11.2

## 2019-01-14 LAB — EPSTEIN BARR VRS(EBV DNA BY PCR)
EBV DNA QN by PCR: NEGATIVE copies/mL
log10 EBV DNA Qn PCR: UNDETERMINED log10 copy/mL

## 2019-01-14 MED ORDER — ONDANSETRON HCL 4 MG PO TABS: 4.0000 mg | ORAL_TABLET | Freq: Four times a day (QID) | ORAL | 2 refills | Status: DC | PRN

## 2019-01-14 MED FILL — ONDANSETRON HCL 4 MG TABLET: 4 | 30 days supply | Qty: 120 | Fill #0

## 2019-01-14 NOTE — Progress Notes (Signed)
Subjective:   Chief complaint: Nausea with vomiting   Patient ID: Justin Ball, male    DOB: 24-Aug-1991, 28 y.o.   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  Subsequently had became infected and had to be removed.  Justin Ball been doing relatively well while living with his grandmother who ensured that he took his medications.  Unfortunately though he ran off with a boyfriend even though Justin Ball is not Competent to make such decisions and ultimately contracted hepatitis C was admitted to the hospital for this.  Is been admitted several times with headaches and still had positive cryptococcal antigens and spinal fluid and being reinduced with amphotericin followed by fluconazole.  Last week he came to the ER with a headache and blurry vision.  He underwent lumbar puncture which had a normal opening present pressure but which revealed a positive cryptococcal antigen in CSF and in serum.  Rather than be re-induced with amphotericin he was simply continued on fluconazole.  Upon admission he was found to have markedly elevated liver function tests with an AST of 604 ALT of 142 alkaline phosphatase of 227 and a bilirubin of 23.4.  He had icterus abdominal pain and nausea and vomiting.  He developed acute kidney injury likely due to dehydration while in the hospital.  My partner Dr. Luciana Axe changed his antiretrovirals from [facility] and [name] to [facility] and [name]  It was unclear to our team or hepatology what was causing his elevation of his transaminases and bilirubin.  Consideration was given to reactivation of hepatitis B if he had been off of his antiretrovirals but his hepatitis B was actually well controlled.  We still not have his viral load back.  Other considerations were an immune  reconstitution syndrome to hepatitis B.  His hepatitis C was certainly active as well.  Ultimately his liver function test stabilized and his creatinine improved.  He was discharged to home.  Of note he also developed a pruritic rash on his back and arms.  Since discharge in the hospital he has been vomiting multiple times per day as he does not have any anti-nausea medicines  In talking to him and his grandmother today the grandmother disclosed to me that he has been purchasing and by being large amounts of alcohol over the week to 2 weeks prior to his admission.  Currently he was drinking up to a bottle of wine per day and up to 3 large cans or bottles of malt liquor when he was not drinking wine  Back then that his acute liver injury was due to to alcohol ingestion superimposed upon chronic hepatitis C B and HIV.  Very concerned that he will not be able to be safely managed at home.  Apparently he did drink further after discharge from the hospital though not in the quantities that he was drinking before.  I have told him that he cannot drink at all right now I am concerned though that his grandmother will not be able to prevent him from drinking.  I proposed admission today but mother wanted to give him a chance at home first.  Therefore we will see him back in 1 week's time provided his liver function test and labs in today or not becoming dramatically worse.  Think ultimately if he is going  to survive he is going need to be admitted undergo treatment for alcoholism and likely need to be chronically institutionalized.  Have also written a letter in support of his grandmother becoming his guardian      Past Medical History:  Diagnosis Date  . 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 Anxiety   . 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 Bipolar 1 disorder (HCC)   . 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 Cognitive communication deficit    working w/ therapy  . 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 Depression   . 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 Fecal incontinence 11/19/2017  . Foley catheter in place   . 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 History of scabies 2013  . History of syphilis   . 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 Neurogenic bowel    wears depends  . Neuromuscular disorder (HCC)   . 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 Seizure disorder [facility](HCC)    neurologist-  dr Marjory Liespenumalli ([facility])--caused by cryptococcal meningitis/ multiple infarctions--  no seizure since hospitalization May 2018  . Status epilepticus (HCC)   . Visuospatial deficit     Past Surgical History:  Procedure Laterality Date  . CYSTOSCOPY N/A 10/10/2017   Procedure: CYSTOSCOPY;  Surgeon: Sebastian AcheManny, Theodore, MD;  [facility] [place];  Service: Urology;  Laterality: N/A;  . EXAMINATION UNDER ANESTHESIA N/A 12/28/2014   Procedure: EXAM UNDER ANESTHESIA;  Surgeon: Karie SodaSteven Gross, MD;  Location: [facility];  Service: General;  Laterality: N/A;  . FLOOR OF MOUTH BIOPSY N/A 09/16/2016   Procedure: BIOPSY OF ORAL ABCESS;  Surgeon: Newman PiesSu Teoh, MD;  [facility]: [facility];  Service: ENT;  Laterality: N/A;  . INCISION AND DRAINAGE ABSCESS N/A 09/16/2016   Procedure: INCISION AND DRAINAGE ABSCESS;  Surgeon: Newman PiesSu Teoh, MD;  [facility];  Service: ENT;  Laterality: N/A;  . INCISION AND DRAINAGE  PERIRECTAL ABSCESS  09/ 03/ 2011   dr toth  . INCISION AND DRAINAGE PERIRECTAL ABSCESS N/A 12/28/2014   Procedure: IRRIGATION AND DEBRIDEMENT PERIRECTAL ABSCESS;  Surgeon: Karie SodaSteven Gross, MD;  [facility];  Service: General;  Laterality: N/A;  Fistula repair and ablation  . INSERTION OF SUPRAPUBIC CATHETER N/A 10/10/2017   Procedure: INSERTION OF SUPRAPUBIC CATHETER;  Surgeon: Sebastian AcheManny, Theodore, MD;  [facility] [place];  Service: Urology;  Laterality: N/A;  . LAPAROSCOPIC REVISION VENTRICULAR-PERITONEAL (V-P) SHUNT N/A 06/12/2017   Procedure: LAPAROSCOPIC REVISION VENTRICULAR-PERITONEAL (V-P) SHUNT;  Surgeon: Kinsinger, De BlanchLuke Aaron, MD;  [facility];  Service: General;  Laterality: N/A;  . SHUNT REMOVAL N/A 01/01/2018   Procedure: REMOVAL OF V-P SHUNT, PLACEMENT OF EVD;  Surgeon: Lisbeth RenshawNundkumar, Neelesh, MD;  [facility];  Service: Neurosurgery;  Laterality: N/A;  . TRANSTHORACIC ECHOCARDIOGRAM  06/02/2017   EF 65-70%/  trivial PR  . VENTRICULOPERITONEAL SHUNT N/A 06/12/2017   Procedure: SHUNT INSERTION VENTRICULAR-PERITONEAL;  Surgeon:  [name], MD;  Location: [facility];  Service: Neurosurgery;  Laterality: N/A;    Family History  Problem Relation Age of Onset  . Hypertension Other       Social History   Socioeconomic History  . Marital status: Single    Spouse name: Not on file  . Number of children: Not on file  . Years of education: Not on file  . Highest education level: Not on file  Occupational History  . Not on file  Social Needs  . Financial resource strain: Not on file  . Food insecurity:    Worry: Not on file    Inability: Not on file  . Transportation needs:    Medical: Not on file    Non-medical: Not on file  Tobacco Use  . Smoking status: Current Every Day Smoker    Packs/day: 0.50    Years: 5.00    Pack years: 2.50    Types: Cigarettes  . Smokeless tobacco: Never Used  . Tobacco comment: cutting back  Substance and Sexual Activity  .  Alcohol use: Yes  . Drug use: No  . Sexual activity: Yes    Partners: Male    Birth control/protection: Condom    Comment: irregular condom use; educated  Lifestyle  . Physical activity:    Days per week: Not on file    Minutes per session: Not on file  . Stress: Not on file  Relationships  . Social connections:    Talks on phone: Not on file    Gets together: Not on file    Attends religious service: Not on file    Active member of club or organization: Not on file    Attends meetings of clubs or organizations: Not on file    Relationship status: Not on file  Other Topics Concern  . Not on file  Social History Narrative  . Not on file    Allergies  Allergen Reactions  . Acyclovir And Related Itching     Current Outpatient Medications:  .  Darunavir-Cobicisctat-Emtricitabine-Tenofovir Alafenamide (SYMTUZA) 800-150-200-10 MG TABS, Take 1 tablet by mouth daily with breakfast., Disp: 30 tablet, Rfl: 0 .  dolutegravir (TIVICAY) 50 MG tablet, Take 1 tablet (50 mg total) by mouth daily., Disp: 30 tablet, Rfl: 0 .  fluconazole (DIFLUCAN) 200 MG tablet, Take 2 tablets (400 mg total) by mouth daily at 6 PM., Disp: 112 tablet, Rfl: 0 .  levETIRAcetam (KEPPRA) 1000 MG tablet, Take 1 tablet (1,000 mg total) by mouth 2 (two) times daily., Disp: 60 tablet, Rfl: 5 .  QUEtiapine (SEROQUEL) 25 MG tablet, Take 1 tablet (25 mg total) by mouth 2 (two) times daily as needed (agitation)., Disp: 60 tablet, Rfl: 4 .  sulfamethoxazole-trimethoprim (BACTRIM DS,SEPTRA DS) 800-160 MG tablet, Take 1 tablet by mouth daily., Disp: 30 tablet, Rfl: 0 .  valACYclovir (VALTREX) 1000 MG tablet, Take 1 tablet (1,000 mg total) by mouth 3 (three) times daily. (Patient taking differently: Take 1,000 mg by mouth every Monday, Wednesday, and Friday. ), Disp: 30 tablet, Rfl: 0 .  acetaminophen (TYLENOL) 325 MG tablet, Take 2 tablets (650 mg total) by mouth every 4 (four) hours as needed for mild pain (temp > 101.5).,  Disp: , Rfl:    Review of Systems  Unable to perform ROS: Dementia       Objective:   Physical Exam  Constitutional: No distress.  HENT:  Head: Normocephalic.    Mouth/Throat: Oropharynx is clear and moist.  Eyes: EOM are  normal. Scleral icterus is present.  Neck: Normal range of motion. No JVD present. No tracheal deviation present.  Cardiovascular: Normal rate, regular rhythm and normal heart sounds. Exam reveals no friction rub.  No murmur heard. Pulmonary/Chest: Effort normal and breath sounds normal. No respiratory distress. He has no wheezes.  Abdominal: Soft. Bowel sounds are normal. He exhibits no distension and no mass. There is abdominal tenderness in the epigastric area. There is no rebound and no guarding.  Musculoskeletal: Normal range of motion.        General: No deformity or edema.  Neurological: He is alert. He has normal strength. No cranial nerve deficit or sensory deficit. Gait normal.  Oriented to person and place and more oriented than in the past  Skin: Rash noted. He is not diaphoretic. No erythema.  Psychiatric: His speech is delayed. He is slowed. Cognition and memory are impaired. He exhibits a depressed mood.        Assessment & Plan:    Hepatitis: With the new information provided by the grandmother hepatitis in a patient with recent acute hepatitis C infection chronic hepatitis B and chronic HIV  I am VERY certain that he may end up dying from liver failure if he continues to drink alcohol.  His grandmother is going to continue to try to stop him from doing this and try to convince him to not drink there is also someone coming to the house that we will try to help as well.  I do not think however that this is going to be able to work and I think he is going to need to be hospitalized and really institutionalized if he is going to be surviving into the near future.  We will see how he does in 1 week's time when he has an appointment with me on  Tuesday if he has drank any alcohol in the interim I will asked that we admit him to the hospital for treatment of his alcoholic hepatitis and for contemplation of chronic placement.  Given his HIV and hepatitis C infection as well as hepatitis B infections his behaviors have impact on many other people with risk of transmission of these viruses to others.  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  HIV disease: Continue TIVICAY on Symtuza checking labs today  Chronic hepatitis B without delta agent: His hep B viral load was only 20 copies so this seems to be well well controlled and would suggest that he is likely taking his antiretrovirals at least prior to admission to the hospital.  Chronic hepatitis C: Not safe to treat in the current condition of his liver  Cryptococcal meningitis: We will continue his fluconazole his opening pressures were surprisingly not high.  Blurry vision: Would like to have him seen by ophthalmology when possible  I spent greater than 40 minutes with the patient including greater than 50% of time in face to face counsel of the patient and his grandmother the importance of him not drinking any alcohol in the danger to his liver and his life if he does do this, the importance of adhering to his antiretroviral medications and the need for him to be in a structured environment and in coordination of his care.

## 2019-01-15 ENCOUNTER — Inpatient Hospital Stay (HOSPITAL_COMMUNITY)
Admission: EM | Admit: 2019-01-15 | Discharge: 2019-01-18 | DRG: 442 | Disposition: A | Discharge status: Home / Self Care | Payer: Medicaid Other | Attending: Family Medicine | Admitting: Family Medicine

## 2019-01-15 ENCOUNTER — Telehealth: Payer: Self-pay | Admitting: Behavioral Health

## 2019-01-15 ENCOUNTER — Encounter (HOSPITAL_COMMUNITY): Payer: Self-pay | Admitting: *Deleted

## 2019-01-15 DIAGNOSIS — B20 Human immunodeficiency virus [HIV] disease: Secondary | ICD-10-CM | POA: Diagnosis present

## 2019-01-15 DIAGNOSIS — Z8661 Personal history of infections of the central nervous system: Secondary | ICD-10-CM

## 2019-01-15 DIAGNOSIS — Z8249 Family history of ischemic heart disease and other diseases of the circulatory system: Secondary | ICD-10-CM | POA: Diagnosis not present

## 2019-01-15 DIAGNOSIS — F0280 Dementia in other diseases classified elsewhere without behavioral disturbance: Secondary | ICD-10-CM | POA: Diagnosis present

## 2019-01-15 DIAGNOSIS — K7290 Hepatic failure, unspecified without coma: Secondary | ICD-10-CM | POA: Diagnosis present

## 2019-01-15 DIAGNOSIS — B1710 Acute hepatitis C without hepatic coma: Secondary | ICD-10-CM | POA: Diagnosis present

## 2019-01-15 DIAGNOSIS — B1920 Unspecified viral hepatitis C without hepatic coma: Secondary | ICD-10-CM

## 2019-01-15 DIAGNOSIS — Z79899 Other long term (current) drug therapy: Secondary | ICD-10-CM | POA: Diagnosis not present

## 2019-01-15 DIAGNOSIS — K759 Inflammatory liver disease, unspecified: Secondary | ICD-10-CM | POA: Diagnosis present

## 2019-01-15 DIAGNOSIS — F1010 Alcohol abuse, uncomplicated: Secondary | ICD-10-CM | POA: Diagnosis present

## 2019-01-15 DIAGNOSIS — N319 Neuromuscular dysfunction of bladder, unspecified: Secondary | ICD-10-CM | POA: Diagnosis present

## 2019-01-15 DIAGNOSIS — G40909 Epilepsy, unspecified, not intractable, without status epilepticus: Secondary | ICD-10-CM | POA: Diagnosis present

## 2019-01-15 DIAGNOSIS — B180 Chronic viral hepatitis B with delta-agent: Secondary | ICD-10-CM | POA: Diagnosis not present

## 2019-01-15 DIAGNOSIS — B182 Chronic viral hepatitis C: Secondary | ICD-10-CM | POA: Diagnosis present

## 2019-01-15 DIAGNOSIS — Z888 Allergy status to other drugs, medicaments and biological substances status: Secondary | ICD-10-CM | POA: Diagnosis not present

## 2019-01-15 DIAGNOSIS — R7989 Other specified abnormal findings of blood chemistry: Secondary | ICD-10-CM | POA: Diagnosis not present

## 2019-01-15 DIAGNOSIS — B181 Chronic viral hepatitis B without delta-agent: Secondary | ICD-10-CM | POA: Diagnosis present

## 2019-01-15 DIAGNOSIS — Z792 Long term (current) use of antibiotics: Secondary | ICD-10-CM | POA: Diagnosis not present

## 2019-01-15 DIAGNOSIS — I69311 Memory deficit following cerebral infarction: Secondary | ICD-10-CM

## 2019-01-15 DIAGNOSIS — E876 Hypokalemia: Secondary | ICD-10-CM | POA: Diagnosis present

## 2019-01-15 DIAGNOSIS — Z7289 Other problems related to lifestyle: Secondary | ICD-10-CM | POA: Diagnosis not present

## 2019-01-15 DIAGNOSIS — K7200 Acute and subacute hepatic failure without coma: Principal | ICD-10-CM | POA: Diagnosis present

## 2019-01-15 DIAGNOSIS — F319 Bipolar disorder, unspecified: Secondary | ICD-10-CM | POA: Diagnosis present

## 2019-01-15 DIAGNOSIS — F17210 Nicotine dependence, cigarettes, uncomplicated: Secondary | ICD-10-CM | POA: Diagnosis present

## 2019-01-15 DIAGNOSIS — Z8619 Personal history of other infectious and parasitic diseases: Secondary | ICD-10-CM

## 2019-01-15 DIAGNOSIS — L700 Acne vulgaris: Secondary | ICD-10-CM | POA: Diagnosis present

## 2019-01-15 DIAGNOSIS — Z9114 Patient's other noncompliance with medication regimen: Secondary | ICD-10-CM | POA: Diagnosis not present

## 2019-01-15 LAB — COMPREHENSIVE METABOLIC PANEL
ALT: 130 U/L — ABNORMAL HIGH (ref 0–44)
AST: 481 U/L — ABNORMAL HIGH (ref 15–41)
Albumin: 1.9 g/dL — ABNORMAL LOW (ref 3.5–5.0)
Alkaline Phosphatase: 206 U/L — ABNORMAL HIGH (ref 38–126)
Anion gap: 8 (ref 5–15)
BUN: 10 mg/dL (ref 6–20)
CO2: 18 mmol/L — AB (ref 22–32)
Calcium: 8.4 mg/dL — ABNORMAL LOW (ref 8.9–10.3)
Chloride: 104 mmol/L (ref 98–111)
Creatinine, Ser: 1.42 mg/dL — ABNORMAL HIGH (ref 0.61–1.24)
GFR calc non Af Amer: >60 mL/min (ref >60)
Glucose, Bld: 99 mg/dL (ref 70–99)
Potassium: 6 mmol/L — ABNORMAL HIGH (ref 3.5–5.1)
Sodium: 130 mmol/L — ABNORMAL LOW (ref 135–145)
Total Bilirubin: 22.9 mg/dL (ref 0.3–1.2)

## 2019-01-15 LAB — BASIC METABOLIC PANEL
Anion gap: 8 (ref 5–15)
BUN: 8 mg/dL (ref 6–20)
CALCIUM: 8.3 mg/dL — AB (ref 8.9–10.3)
CO2: 19 mmol/L — ABNORMAL LOW (ref 22–32)
CREATININE: 1.48 mg/dL — AB (ref 0.61–1.24)
Chloride: 105 mmol/L (ref 98–111)
GFR calc Af Amer: >60 mL/min (ref >60)
GFR calc non Af Amer: >60 mL/min (ref >60)
Glucose, Bld: 102 mg/dL — ABNORMAL HIGH (ref 70–99)
Potassium: 2.5 mmol/L — CL (ref 3.5–5.1)
Sodium: 132 mmol/L — ABNORMAL LOW (ref 135–145)

## 2019-01-15 LAB — CBC WITH DIFFERENTIAL/PLATELET
Abs Immature Granulocytes: 0.02 10*3/uL (ref 0.00–0.07)
Basophils Absolute: 0 10*3/uL (ref 0.0–0.1)
Basophils Relative: 1 %
Eosinophils Absolute: 0.1 10*3/uL (ref 0.0–0.5)
Eosinophils Relative: 2 %
HEMATOCRIT: 35.5 % — AB (ref 39.0–52.0)
Hemoglobin: 12.2 g/dL — ABNORMAL LOW (ref 13.0–17.0)
Immature Granulocytes: 0 %
Lymphocytes Relative: 25 %
Lymphs Abs: 1.2 10*3/uL (ref 0.7–4.0)
MCH: 30 pg (ref 26.0–34.0)
MCHC: 34.4 g/dL (ref 30.0–36.0)
MCV: 87.4 fL (ref 80.0–100.0)
MONO ABS: 0.6 10*3/uL (ref 0.1–1.0)
MONOS PCT: 12 %
Neutro Abs: 3 10*3/uL (ref 1.7–7.7)
Neutrophils Relative %: 60 %
Platelets: 173 10*3/uL (ref 150–400)
RBC: 4.06 MIL/uL — ABNORMAL LOW (ref 4.22–5.81)
RDW: 19.1 % — ABNORMAL HIGH (ref 11.5–15.5)
WBC: 5 10*3/uL (ref 4.0–10.5)
nRBC: 0.4 % — ABNORMAL HIGH (ref 0.0–0.2)

## 2019-01-15 LAB — MRSA PCR SCREENING: MRSA by PCR: NEGATIVE

## 2019-01-15 LAB — PROTIME-INR
INR: 1.42
Prothrombin Time: 17.2 seconds — ABNORMAL HIGH (ref 11.4–15.2)

## 2019-01-15 LAB — AMMONIA: Ammonia: 80 umol/L — ABNORMAL HIGH (ref 9–35)

## 2019-01-15 MED ORDER — POTASSIUM CHLORIDE CRYS ER 20 MEQ PO TBCR
40.0000 meq | EXTENDED_RELEASE_TABLET | Freq: Two times a day (BID) | ORAL | Status: DC
  Administered 2019-01-15 – 2019-01-16 (×2): 40 meq via ORAL
  Filled 2019-01-15 (×2): qty 2

## 2019-01-15 MED ORDER — POTASSIUM CHLORIDE 10 MEQ/100ML IV SOLN
10.0000 meq | INTRAVENOUS | Status: AC
  Administered 2019-01-15 (×2): 10 meq via INTRAVENOUS
  Filled 2019-01-15 (×2): qty 100

## 2019-01-15 MED ORDER — SODIUM CHLORIDE 0.9% FLUSH
3.0000 mL | Freq: Two times a day (BID) | INTRAVENOUS | Status: DC
  Administered 2019-01-16 – 2019-01-17 (×3): 3 mL via INTRAVENOUS

## 2019-01-15 MED ORDER — DARUN-COBIC-EMTRICIT-TENOFAF 800-150-200-10 MG PO TABS
1.0000 | ORAL_TABLET | Freq: Every day | ORAL | Status: DC
  Administered 2019-01-15 – 2019-01-17 (×3): 1 via ORAL
  Filled 2019-01-15 (×4): qty 1

## 2019-01-15 MED ORDER — SODIUM CHLORIDE 0.9% FLUSH: 3.0000 mL | INTRAVENOUS | Status: DC | PRN

## 2019-01-15 MED ORDER — SODIUM CHLORIDE 0.9 % IV SOLN
250.0000 mL | INTRAVENOUS | Status: DC | PRN
  Administered 2019-01-15: 250 mL via INTRAVENOUS

## 2019-01-15 MED ORDER — POLYETHYLENE GLYCOL 3350 17 G PO PACK
17.0000 g | PACK | Freq: Every day | ORAL | Status: DC | PRN
  Administered 2019-01-16: 17 g via ORAL
  Filled 2019-01-15: qty 1

## 2019-01-15 MED ORDER — NICOTINE 14 MG/24HR TD PT24
14.0000 mg | MEDICATED_PATCH | Freq: Every day | TRANSDERMAL | Status: DC | PRN
  Administered 2019-01-16: 14 mg via TRANSDERMAL
  Filled 2019-01-15: qty 1

## 2019-01-15 MED ORDER — QUETIAPINE FUMARATE 25 MG PO TABS
25.0000 mg | ORAL_TABLET | Freq: Two times a day (BID) | ORAL | Status: DC | PRN
  Administered 2019-01-16 – 2019-01-17 (×3): 25 mg via ORAL
  Filled 2019-01-15 (×3): qty 1

## 2019-01-15 MED ORDER — DOLUTEGRAVIR SODIUM 50 MG PO TABS
50.0000 mg | ORAL_TABLET | Freq: Every day | ORAL | Status: DC
  Administered 2019-01-15 – 2019-01-17 (×3): 50 mg via ORAL
  Filled 2019-01-15 (×3): qty 1

## 2019-01-15 MED ORDER — LEVETIRACETAM 500 MG PO TABS
1000.0000 mg | ORAL_TABLET | Freq: Two times a day (BID) | ORAL | Status: DC
  Administered 2019-01-15 – 2019-01-17 (×5): 1000 mg via ORAL
  Filled 2019-01-15 (×5): qty 2

## 2019-01-15 NOTE — Progress Notes (Signed)
I saw and examined this patient.  I discussed with the full resident team and we have agreed on a management plan.  I will co-sign the H&PE when available.  Briefly, very complicated 28 yo male with HIV, Hep B, Hep C and liver failure presents with worsening hepatic function.  Just discharged 5 days ago.  Story is a bit unclear.  Grandmother reports both patient drank wine and that he had nausea and vomiting.  Saw his ID physician yesterday and with labs drawn.  This morning, called and told to come to the ER because of worsening LFTs. Today's labs are trending back to his previous baseline.  He feels fine.  I believe his symptoms are best explained by acute hepatic injury due to ETOH ingestion.  Will see what LFTs show tomorrow.  Likely DC in am.

## 2019-01-15 NOTE — ED Provider Notes (Signed)
[facility] EMERGENCY DEPARTMENT Provider Note   CSN: [id number] Arrival date & time: 01/15/19  1139     History   Chief Complaint Chief Complaint  Patient presents with  . Abnormal Lab    HPI Justin Ball is a 28 y.o. male.  Dr. [name] called pt's Grandmother and requested that she bring pt to the ED to be admitted.  Pt was discharged from the hospital on 1/18.   The history is provided by the patient. No language interpreter was used.  Abnormal Lab  Time since result:  Yesterday Patient referred by:  Specialist Result type: chemistry   Chemistry:    Other abnormal chemistry result:  Bili   Pt has a history of Aids, Seizures, Hepatitis B.  Pt was admitted on 1/15 and discharged on 1/18 for acute kidney injury.   Past Medical History:  Diagnosis Date  . 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 Alcoholic hepatitis 01/14/2019  . Anxiety   . 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 Bipolar 1 disorder (HCC)   . 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 Cognitive communication deficit    working w/ therapy  . 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 Depression   . 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 Fecal incontinence 11/19/2017  . Foley catheter in place   . 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 History of scabies 2013  . History of syphilis   . 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 Nausea and vomiting 01/14/2019  . Neurogenic  bladder    secondary to damage from brain infection  . Neurogenic bowel    wears depends  . Neuromuscular disorder (HCC)   . 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 Seizure disorder [facility])    neurologist-  dr Marjory Lies ([facility])--caused by cryptococcal meningitis/ multiple infarctions--  no seizure since hospitalization May 2018  . Status epilepticus (HCC)   . Visuospatial deficit     Patient Active Problem List   Diagnosis Date Noted  . Alcoholic hepatitis 01/14/2019  . Nausea and vomiting 01/14/2019  . RUQ pain   . Anxiety   . Jaundice 01/08/2019  . Perianal ulcer (HCC) 11/14/2018  . Hepatitis C virus infection without hepatic coma   . Acute renal failure (HCC)   . Non compliance w medication regimen   . Prolonged Q-T interval on ECG   . Intraabdominal fluid collection   . Seizure disorder (HCC) 09/12/2017  . 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 History of syphilis   . Tobacco abuse   . Cryptococcal meningoencephalitis (HCC)   . Worsening headaches 05/20/2017  . 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 Hypokalemia 09/12/2016  . Anemia 09/12/2016  . AIDS (acquired immune deficiency syndrome) (HCC) 01/19/2015  . 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 Bipolar disorder (HCC) 04/08/2013    Past Surgical History:  Procedure Laterality Date  . CYSTOSCOPY N/A  10/10/2017   Procedure: CYSTOSCOPY;  Surgeon: Sebastian AcheManny, Theodore, MD;  [facility] [place];  Service: Urology;  Laterality: N/A;  . EXAMINATION UNDER ANESTHESIA N/A 12/28/2014   Procedure: EXAM UNDER ANESTHESIA;  Surgeon: Karie SodaSteven Gross, MD;  [facility];  Service: General;  Laterality: N/A;  . FLOOR OF MOUTH BIOPSY N/A 09/16/2016   Procedure: BIOPSY OF ORAL ABCESS;  Surgeon: Newman PiesSu Teoh, MD;  [facility];  Service: ENT;  Laterality: N/A;  . INCISION AND DRAINAGE ABSCESS N/A 09/16/2016   Procedure: INCISION AND DRAINAGE ABSCESS;  Surgeon: Newman PiesSu  Teoh, MD;  [facility];  Service: ENT;  Laterality: N/A;  . INCISION AND DRAINAGE PERIRECTAL ABSCESS  09/ 03/ 2011   dr toth  . INCISION AND DRAINAGE PERIRECTAL ABSCESS N/A 12/28/2014   Procedure: IRRIGATION AND DEBRIDEMENT PERIRECTAL ABSCESS;  Surgeon: Karie SodaSteven Gross, MD;  [facility];  Service: General;  Laterality: N/A;  Fistula repair and ablation  . INSERTION OF SUPRAPUBIC CATHETER N/A 10/10/2017   Procedure: INSERTION OF SUPRAPUBIC CATHETER;  Surgeon: Sebastian AcheManny, Theodore, MD;  [facility] ;  Service: Urology;  Laterality: N/A;  . LAPAROSCOPIC REVISION VENTRICULAR-PERITONEAL (V-P) SHUNT N/A 06/12/2017   Procedure: LAPAROSCOPIC REVISION VENTRICULAR-PERITONEAL (V-P) SHUNT;  Surgeon: Kinsinger, De BlanchLuke Aaron, MD;  [facility];  Service: General;  Laterality: N/A;  . SHUNT REMOVAL N/A 01/01/2018   Procedure: REMOVAL OF V-P SHUNT, PLACEMENT OF EVD;  Surgeon: Lisbeth RenshawNundkumar, Neelesh, MD;  [facility];  Service: Neurosurgery;  Laterality: N/A;  . TRANSTHORACIC ECHOCARDIOGRAM  06/02/2017   EF 65-70%/  trivial PR  . VENTRICULOPERITONEAL SHUNT N/A 06/12/2017   Procedure: SHUNT INSERTION VENTRICULAR-PERITONEAL;  Surgeon: Lisbeth RenshawNundkumar, Neelesh, MD;  [facility];  Service: Neurosurgery;  Laterality: N/A;        Home Medications    Prior to Admission medications   Medication Sig Start Date End Date Taking? Authorizing Provider  acetaminophen (TYLENOL) 325 MG tablet Take 2 tablets (650 mg total) by mouth every 4 (four) hours as needed for mild pain (temp > 101.5). 06/28/17   Cliffton Astersampbell, John, MD  Darunavir-Cobicisctat-Emtricitabine-Tenofovir Alafenamide [facility](SYMTUZA) 800-150-200-10 MG TABS Take 1 tablet by mouth daily with breakfast. 01/11/19   Wendee BeaversMcMullen, David J, DO  dolutegravir (TIVICAY) 50 MG tablet Take 1 tablet (50 mg total) by mouth daily. 01/11/19   Wendee BeaversMcMullen, David J, DO  fluconazole (DIFLUCAN) 200 MG tablet Take 2 tablets (400 mg total) by mouth daily at 6 PM. 10/14/18   Judyann MunsonSnider,  Cynthia, MD  levETIRAcetam (KEPPRA) 1000 MG tablet Take 1 tablet (1,000 mg total) by mouth 2 (two) times daily. 09/23/18   Randall HissVan Dam, [name], MD  ondansetron [facility](ZOFRAN) 4 MG tablet Take 1 tablet (4 mg total) by mouth every 6 (six) hours as needed for nausea or vomiting. 01/14/19   Daiva EvesVan Dam, Lisette Grinderornelius N, MD  QUEtiapine (SEROQUEL) 25 MG tablet Take 1 tablet (25 mg total) by mouth 2 (two) times daily as needed (agitation). 09/23/18   Randall HissVan Dam, [name], MD  sulfamethoxazole-trimethoprim (BACTRIM DS,SEPTRA DS) 800-160 MG tablet Take 1 tablet by mouth daily. 10/15/18   Judyann MunsonSnider, Cynthia, MD  valACYclovir (VALTREX) 1000 MG tablet Take 1 tablet (1,000 mg total) by mouth 3 (three) times daily. Patient taking differently: Take 1,000 mg by mouth every Monday, Wednesday, and Friday.  11/14/18   Cliffton Astersampbell, John, MD    Family History Family History  Problem Relation Age of Onset  . Hypertension Other     Social History Social History   Tobacco  Use  . Smoking status: Current Every Day Smoker    Packs/day: 0.50    Years: 5.00    Pack years: 2.50    Types: Cigarettes  . Smokeless tobacco: Never Used  . Tobacco comment: cutting back  Substance Use Topics  . Alcohol use: Yes  . Drug use: No     Allergies   Acyclovir and related   Review of Systems Review of Systems  All other systems reviewed and are negative.    Physical Exam Updated Vital Signs BP 111/81   Pulse 93   Temp 98.3 F (36.8 C)   Resp 16   Ht 6\' 1"  (1.854 m)   Wt 71.7 kg   SpO2 98%   BMI 20.85 kg/m   Physical Exam Vitals signs reviewed.  Constitutional:      Appearance: Normal appearance.  HENT:     Head: Normocephalic.     Right Ear: Tympanic membrane normal.     Left Ear: Tympanic membrane normal.     Nose: Nose normal.     Mouth/Throat:     Mouth: Mucous membranes are moist.  Eyes:     General: Scleral icterus present.     Pupils: Pupils are equal, round, and reactive to light.  Neck:      Musculoskeletal: Normal range of motion.  Cardiovascular:     Rate and Rhythm: Normal rate.  Pulmonary:     Effort: Pulmonary effort is normal.  Abdominal:     General: Bowel sounds are normal.  Musculoskeletal: Normal range of motion.  Skin:    General: Skin is warm.     Coloration: Skin is jaundiced.  Neurological:     General: No focal deficit present.     Mental Status: He is alert.  Psychiatric:        Mood and Affect: Mood normal.      ED Treatments / Results  Labs (all labs ordered are listed, but only abnormal results are displayed) Labs Reviewed  COMPREHENSIVE METABOLIC PANEL  CBC WITH DIFFERENTIAL/PLATELET  PROTIME-INR    EKG None  Radiology No results found.  Procedures Procedures (including critical care time)  Medications Ordered in ED Medications - No data to display   Initial Impression / Assessment and Plan / ED Course  I have reviewed the triage vital signs and the nursing notes.  Pertinent labs & imaging results that were available during my care of the patient were reviewed by me and considered in my medical decision making (see chart for details).     MDM   [name] labs from yesterday reviewed.  Pt had tbili of 26.2.  I spoke to the [facility] resident who will see pt for admission.   Final Clinical Impressions(s) / ED Diagnoses   Final diagnoses:  Hepatitis    ED Discharge Orders    None       Elson Areas, [place] 01/15/19 1512    Tegeler, Canary Brim, MD 01/15/19 1859

## 2019-01-15 NOTE — Telephone Encounter (Signed)
I think given progression of his liver disease it is probably most appropriate to go through the ER in case something happens related to this.  I also dont think he can be admitted from home based on yesterdays visit.

## 2019-01-15 NOTE — Telephone Encounter (Signed)
Justin Ball's mother Justin Ball called back to clarify if they had to wait in the ER or if they are being admitted, since Dr Justin Ball saw them yesterday. RN stated they should go to the ER, but offered to call the triage desk to let them know he is being sent because of test results from yesterday's visit. Andree Coss, RN

## 2019-01-15 NOTE — H&P (Addendum)
[facility] Admission History and Physical Service Pager: [phone]  Patient name: Justin Ball Medical record number: [id number] Date of birth: 11/17/1991 Age: 28 y.o. Gender: male  Primary Care Provider: Patient, No Pcp Per Consultants: infectious disease Code Status: full  Chief Complaint: "I was told my liver is failing"  Assessment and Plan: EDENILSON AUSTAD is a 28 y.o. male presenting with elevated liver enzymes concerning for liver failure . PMH is significant for HIV, Hep B, Hep C, alcoholism, bipolar disorder  Elevated Transaminases w/ hyperbilirbuinemia. - Concerning for liver failure by PCP, Dr. Tommy Medal who recommended admission. Patient has chronic untreated HBV and HCV. Hep B E antigen on last admission was positive.  During last admission CT abdomen and RUQ U/S did not show signs of cirrhosis or biliary obstruction but transaminases were elevated ( 408 AST/112 ALT) and T bili was 22.  At his appointment with infectious disease his AST was over 500 and T blili was 26. During this encounter in the ED his AST/ALT was elevated at  480/120.  INR is and T bili was 22 again. INR was 1.42, albumin low at 1.9, and alk phos elevated at 206. He has remarkable scleral icterus and abdominal pain in the RUQ and epigastrically.  He has had no changes in symptoms and no complaints since his last discharge. Mental status appears intact. No signs of hepatic encephalopathy. His LFTs most likely elevated after his discharge 2/2 alcohol consumption.  Although no signs of cirrhosis he presents a picture of liver failure secondary to Hepatitis and HIV infection and alcohol consumption.   - admit to inpatient, medical telemetry, Dr Andria Frames attending - Infectious Disease consulted, appreciate recs - GI consulted, appreciate recomendations - morning CBC, BMP, INR, PTT - continuous cardiac monitoring  - monitor I/Os - vitals q4 - renal diet w/ fluid restriction  - incentive  spirometry  HIV/AIDS - patient now takes [place] and [place] daily as of last week d/t poor adherence when having to take with meals.  at home.  CD4 % is 10. [facility]  - continue new home meds  Prolonged QT - QTc 455 on recent EKG during last admission.  - hold QT prolonging meds - am EKG  HIV Dementia  Bipolar disorder and agitation - takes seroquel 78m BID PRN at home.Per psych note on 1/16: He should follow up with his outpatient provider for medication management. He lacks capacity to make decisions regarding his current medical conditions due to lack of understanding of his medical conditions. 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 He followed up with Dr. CMegan Salonwith ID on 11/28/2018 and was continued on Fluconazole and Keppra and was to follow up on 1/28 with Dr. VTommy Medal - continue home keppra - holding fluconazole  Alcohol use disorder - patient was recently hospitalized and discharged 5 days ago.  He reportedly consumed alcohol the first night he returned home but has not since. Not necessary to put on CIWA at this time as he just returned from a hospitalization and has only drank one time since then.  - monitor for signs of withdrawal, treat if  necessary.   Tobacco use - consider nicotine patch if needed.   FEN/GI: fluid restricted diet.  Prophylaxis: SCDs  Disposition: med tele  History of Present Illness:  Justin Ball is a 28 y.o. male presenting with increase in LFTs and jaundice/scleral icterus.   Patient was discharged from the [facility] inpatient service on  Saturday.  Grandmom states that he drank some wine that night, but has not drank alcohol since.  Patient denies drinking anything else as well.  He and his grandmom believe his eyes have become 'more yellow'.  He saw his ID doctor yesterday who ordered some tests and told the patient to come into the hospital for admission for concern of liver failure. Patient denies abdominal pain, nausea, vomiting.  He usually drinks a few beers and abottle of wine about every other day.  He smokes cigarettes daily but does not use drugs.    Lives with grandmother Collie Siad who is primary care giver. She is not currently his HCPOA but is in the process of completing paperwork. Patient lacks capacity based on psychiatry assessment last week.  Review Of Systems: See HPI for pertinent.  Review of Systems  Constitutional: Negative for chills and fever.  HENT: Negative for congestion and sore throat.   Eyes: Negative for blurred vision and double vision.  Respiratory: Positive for cough. Negative for sputum production.   Cardiovascular: Negative for chest pain and leg swelling.  Gastrointestinal: Negative for abdominal pain, blood in stool, diarrhea, melena, nausea and vomiting.  Genitourinary: Negative for dysuria and urgency.  Musculoskeletal: Negative for back pain and neck pain.  Skin: Positive for itching.       Jaundice  Neurological: Negative for dizziness and headaches.    Patient Active Problem List   Diagnosis Date Noted  . Alcoholic hepatitis 84/16/6063  . Nausea and vomiting 01/14/2019  . RUQ pain   . Anxiety   . Jaundice 01/08/2019  . Perianal ulcer ([place]) 11/14/2018  . Hepatitis C virus infection without hepatic coma   . Acute renal failure ([place])   . Non compliance w medication regimen   . Prolonged Q-T interval on ECG   . Intraabdominal fluid collection   . Seizure disorder ([place]) 09/12/2017  . 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 Chronic viral hepatitis B without coma and with delta agent ([place])   .  History of syphilis   . Tobacco abuse   . Cryptococcal meningoencephalitis ([place])   . Worsening headaches 05/20/2017  . 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 Hypokalemia 09/12/2016  . Anemia 09/12/2016  . AIDS (acquired immune deficiency syndrome) ([place]) 01/19/2015  . 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 Bipolar disorder ([place]) 04/08/2013    Past Medical History: Past Medical History:  Diagnosis Date  . 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 Alcoholic hepatitis 0/16/0109  . Anxiety   . 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 Bipolar 1 disorder ([place])   . Chronic viral hepatitis B without coma and with delta agent ([place])   . Cognitive communication deficit    working w/ therapy  . Cryptococcal meningoencephalitis ([place]) 32/35/5732   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 and  near heriation of brain (required VP shunt)--- residual CNS damage  . Depression   . 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 Fecal incontinence 11/19/2017  . Foley  catheter in place   . 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 History of scabies 2013  . History of syphilis   . 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 Nausea and vomiting 01/14/2019  . 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 Neurogenic bowel    wears depends  . Neuromuscular disorder ([place])   . 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 Seizure disorder [facility])    neurologist-  dr Leta Baptist ([place] [facility])--caused by cryptococcal meningitis/ multiple infarctions--  no seizure since hospitalization May 2018  . Status epilepticus ([place])   . Visuospatial  deficit     Past Surgical History: Past Surgical History:  Procedure Laterality Date  . CYSTOSCOPY N/A 10/10/2017   Procedure: CYSTOSCOPY;  Surgeon: Alexis Frock, MD;  Location: [facility];  Service: Urology;  Laterality: N/A;  . EXAMINATION UNDER ANESTHESIA N/A 12/28/2014   Procedure: EXAM UNDER ANESTHESIA;  Surgeon: Michael Boston, MD;  Location: [facility];  Service: General;  Laterality: N/A;  . FLOOR OF MOUTH BIOPSY N/A 09/16/2016   Procedure: BIOPSY OF ORAL ABCESS;  Surgeon: Leta Baptist, MD;  Location: [place];  Service: ENT;  Laterality: N/A;  . INCISION AND DRAINAGE ABSCESS N/A 09/16/2016   Procedure: INCISION AND DRAINAGE ABSCESS;  Surgeon: Leta Baptist, MD;  Location: [place];  Service: ENT;  Laterality: N/A;  . [place]  09/ 03/ 2011   dr toth  . INCISION AND DRAINAGE PERIRECTAL ABSCESS N/A 12/28/2014   Procedure: IRRIGATION AND DEBRIDEMENT PERIRECTAL ABSCESS;  Surgeon: Michael Boston, MD;  Location: [facility];  Service: General;  Laterality: N/A;  Fistula repair and ablation  . INSERTION OF SUPRAPUBIC CATHETER N/A 10/10/2017   Procedure: INSERTION OF SUPRAPUBIC CATHETER;  Surgeon: Alexis Frock, MD;  Location[facility];  Service: Urology;  Laterality: N/A;  . LAPAROSCOPIC REVISION VENTRICULAR-PERITONEAL (V-P) SHUNT N/A 06/12/2017   Procedure: LAPAROSCOPIC REVISION VENTRICULAR-PERITONEAL (V-P) SHUNT;  Surgeon: Kinsinger, Arta Bruce, MD;  Location: [place];  Service: General;  Laterality: N/A;  . SHUNT REMOVAL N/A 01/01/2018   Procedure: REMOVAL OF V-P SHUNT, PLACEMENT OF EVD;  Surgeon: Consuella Lose, MD;  Location: [place];  Service: Neurosurgery;  Laterality: N/A;  . TRANSTHORACIC ECHOCARDIOGRAM  06/02/2017   EF 65-70%/  trivial PR  . VENTRICULOPERITONEAL SHUNT N/A 06/12/2017   Procedure: SHUNT INSERTION VENTRICULAR-PERITONEAL;  Surgeon: Consuella Lose, MD;  Location: [place];  Service: Neurosurgery;  Laterality: N/A;    Social  History: Social History   Tobacco Use  . Smoking status: Current Every Day Smoker    Packs/day: 0.50    Years: 5.00    Pack years: 2.50    Types: Cigarettes  . Smokeless tobacco: Never Used  . Tobacco comment: cutting back  Substance Use Topics  . Alcohol use: Yes  . Drug use: No    Family History: Family History  Problem Relation Age of Onset  . Hypertension Other     Allergies and Medications: Allergies  Allergen Reactions  . Acyclovir And Related Itching   No current facility-administered medications on file prior to encounter.    Current Outpatient Medications on File Prior to Encounter  Medication Sig Dispense Refill  . acetaminophen (TYLENOL) 325 MG tablet Take 2 tablets (650 mg total) by mouth every 4 (four) hours  as needed for mild pain (temp > 101.5).    . Darunavir-Cobicisctat-Emtricitabine-Tenofovir Alafenamide (SYMTUZA) 800-150-200-10 MG TABS Take 1 tablet by mouth daily with breakfast. 30 tablet 0  . dolutegravir (TIVICAY) 50 MG tablet Take 1 tablet (50 mg total) by mouth daily. 30 tablet 0  . fluconazole (DIFLUCAN) 200 MG tablet Take 2 tablets (400 mg total) by mouth daily at 6 PM. 112 tablet 0  . levETIRAcetam (KEPPRA) 1000 MG tablet Take 1 tablet (1,000 mg total) by mouth 2 (two) times daily. 60 tablet 5  . ondansetron (ZOFRAN) 4 MG tablet Take 1 tablet (4 mg total) by mouth every 6 (six) hours as needed for nausea or vomiting. 120 tablet 2  . QUEtiapine (SEROQUEL) 25 MG tablet Take 1 tablet (25 mg total) by mouth 2 (two) times daily as needed (agitation). 60 tablet 4  . sulfamethoxazole-trimethoprim (BACTRIM DS,SEPTRA DS) 800-160 MG tablet Take 1 tablet by mouth daily. 30 tablet 0  . valACYclovir (VALTREX) 1000 MG tablet Take 1 tablet (1,000 mg total) by mouth 3 (three) times daily. (Patient taking differently: Take 1,000 mg by mouth every Monday, Wednesday, and Friday. ) 30 tablet 0    Objective: BP 117/78   Pulse 89   Temp 98.3 F (36.8 C)   Resp 16    Ht '6\' 1"'  (1.854 m)   Wt 71.7 kg   SpO2 97%   BMI 20.85 kg/m  Exam: General: alert, oriented to person, place, situation, date.  No acute distress.  Eyes: significant scleral icterus.  EOMI, PERRL.   ENTM: no oropharyngeal erythema or exudates.  Uvula midline.  Moist oral mucosa. Lips are minimally dry.   Neck: soft, no masses.  No cervical LAD.  Cardiovascular: Regular rhythm. Normal rate.  No murmurs. 2+ pulses b/l Respiratory: LCTAB.  No increased work of breathing.  Gastrointestinal: soft, tender to palpation in RUQ and epigastrically.  No hepatomegaly.  Normal bowel sounds, absent asterixis MSK: 5/5 strength bilaterally upper and lower extremities.  Derm: open comedones on face, which appear to have been picked at.  Several circular areas of hyperpigmentation on arms that appear to be old areas of excoriation.   Neuro: CN 2-12 grossly intact.  Sensation intact bilaterally.  Psych: pleasant.  Answers questions appropriately.   Labs and Imaging: CBC BMET  Recent Labs  Lab 01/15/19 1404  WBC 5.0  HGB 12.2*  HCT 35.5*  PLT 173   Recent Labs  Lab 01/15/19 1404  NA 130*  K 6.0*  CL 104  CO2 18*  BUN 10  CREATININE 1.42*  GLUCOSE 99  CALCIUM 8.4Benay Pike, MD 01/15/2019, 3:10 PM PGY-1, [place] Intern pager: [phone], text pages welcome  Upper Level Addendum: I have seen and evaluated this patient along with Dr. Jeannine Kitten and reviewed the above note, making necessary revisions in blue.  Harriet Butte, [place], PGY-3

## 2019-01-15 NOTE — Telephone Encounter (Signed)
Called Justin Ball this am spoke with he and his grandmother [name] this morning.  Informed them Dr. [name] reviewed lab work from yesterday and LFT's and Bilirubin are worse and he needs to go to the ER for admission to the hospital ASAP.  Informed them his liver is failing per Dr. [name].  [name] states she was going to take him to the ER ASAP. Angeline Slim RN

## 2019-01-15 NOTE — ED Triage Notes (Signed)
Pt sent over from infectious disease for evaluation of liver failure, recent labs in the system

## 2019-01-16 DIAGNOSIS — B180 Chronic viral hepatitis B with delta-agent: Secondary | ICD-10-CM

## 2019-01-16 DIAGNOSIS — Z9114 Patient's other noncompliance with medication regimen: Secondary | ICD-10-CM

## 2019-01-16 DIAGNOSIS — F319 Bipolar disorder, unspecified: Secondary | ICD-10-CM

## 2019-01-16 DIAGNOSIS — F17210 Nicotine dependence, cigarettes, uncomplicated: Secondary | ICD-10-CM

## 2019-01-16 DIAGNOSIS — Z888 Allergy status to other drugs, medicaments and biological substances status: Secondary | ICD-10-CM

## 2019-01-16 DIAGNOSIS — Z7289 Other problems related to lifestyle: Secondary | ICD-10-CM

## 2019-01-16 DIAGNOSIS — B1710 Acute hepatitis C without hepatic coma: Secondary | ICD-10-CM

## 2019-01-16 DIAGNOSIS — B182 Chronic viral hepatitis C: Secondary | ICD-10-CM

## 2019-01-16 DIAGNOSIS — B181 Chronic viral hepatitis B without delta-agent: Secondary | ICD-10-CM

## 2019-01-16 DIAGNOSIS — Z8661 Personal history of infections of the central nervous system: Secondary | ICD-10-CM

## 2019-01-16 DIAGNOSIS — R7989 Other specified abnormal findings of blood chemistry: Secondary | ICD-10-CM

## 2019-01-16 DIAGNOSIS — K7200 Acute and subacute hepatic failure without coma: Principal | ICD-10-CM

## 2019-01-16 DIAGNOSIS — B20 Human immunodeficiency virus [HIV] disease: Secondary | ICD-10-CM

## 2019-01-16 DIAGNOSIS — Z792 Long term (current) use of antibiotics: Secondary | ICD-10-CM

## 2019-01-16 LAB — CBC WITH DIFFERENTIAL/PLATELET
Absolute Monocytes: 572 cells/uL (ref 200–950)
Basophils Absolute: 48 cells/uL (ref 0–200)
Basophils Relative: 0.9 %
Eosinophils Absolute: 101 cells/uL (ref 15–500)
Eosinophils Relative: 1.9 %
Hemoglobin: 13.2 g/dL (ref 13.2–17.1)
Lymphs Abs: 1579 cells/uL (ref 850–3900)
MPV: 11.6 fL (ref 7.5–12.5)
Monocytes Relative: 10.8 %
Neutro Abs: 3000 cells/uL (ref 1500–7800)
Neutrophils Relative %: 56.6 %
Platelets: 89 10*3/uL — ABNORMAL LOW (ref 140–400)
Total Lymphocyte: 29.8 %
WBC: 5.3 10*3/uL (ref 3.8–10.8)

## 2019-01-16 LAB — PROTIME-INR
INR: 1.48
Prothrombin Time: 17.8 seconds — ABNORMAL HIGH (ref 11.4–15.2)

## 2019-01-16 LAB — COMPLETE METABOLIC PANEL WITH GFR
AG RATIO: 0.4 (calc) — AB (ref 1.0–2.5)
ALT: 133 U/L — AB (ref 9–46)
AST: 526 U/L — ABNORMAL HIGH (ref 10–40)
Albumin: 2.6 g/dL — ABNORMAL LOW (ref 3.6–5.1)
Alkaline phosphatase (APISO): 230 U/L — ABNORMAL HIGH (ref 40–115)
BUN: 10 mg/dL (ref 7–25)
CO2: 22 mmol/L (ref 20–32)
Calcium: 8.6 mg/dL (ref 8.6–10.3)
Chloride: 99 mmol/L (ref 98–110)
Creat: 1.27 mg/dL (ref 0.60–1.35)
GFR, Est African American: 89 mL/min/{1.73_m2} (ref >=60)
GFR, Est Non African American: 77 mL/min/{1.73_m2} (ref >=60)
Globulin: 7.1 g/dL (calc) — ABNORMAL HIGH (ref 1.9–3.7)
Glucose, Bld: 91 mg/dL (ref 65–99)
Potassium: 3.1 mmol/L — ABNORMAL LOW (ref 3.5–5.3)
Sodium: 131 mmol/L — ABNORMAL LOW (ref 135–146)
Total Bilirubin: 26.2 mg/dL — ABNORMAL HIGH (ref 0.2–1.2)
Total Protein: 9.7 g/dL — ABNORMAL HIGH (ref 6.1–8.1)

## 2019-01-16 LAB — COMPREHENSIVE METABOLIC PANEL
ALT: 124 U/L — ABNORMAL HIGH (ref 0–44)
AST: 467 U/L — AB (ref 15–41)
Albumin: 1.8 g/dL — ABNORMAL LOW (ref 3.5–5.0)
Alkaline Phosphatase: 178 U/L — ABNORMAL HIGH (ref 38–126)
Anion gap: 6 (ref 5–15)
BUN: 9 mg/dL (ref 6–20)
CO2: 19 mmol/L — ABNORMAL LOW (ref 22–32)
Calcium: 8.4 mg/dL — ABNORMAL LOW (ref 8.9–10.3)
Chloride: 106 mmol/L (ref 98–111)
Creatinine, Ser: 1.42 mg/dL — ABNORMAL HIGH (ref 0.61–1.24)
GFR calc Af Amer: >60 mL/min (ref >60)
Glucose, Bld: 97 mg/dL (ref 70–99)
Potassium: 3 mmol/L — ABNORMAL LOW (ref 3.5–5.1)
Sodium: 131 mmol/L — ABNORMAL LOW (ref 135–145)
Total Bilirubin: 24.2 mg/dL (ref 0.3–1.2)
Total Protein: 9.8 g/dL — ABNORMAL HIGH (ref 6.5–8.1)

## 2019-01-16 LAB — T-HELPER CELL (CD4) - (RCID CLINIC ONLY)
CD4 % Helper T Cell: 10 % — ABNORMAL LOW (ref 33–55)
CD4 T Cell Abs: 160 /uL — ABNORMAL LOW (ref 400–2700)

## 2019-01-16 LAB — URINE CYTOLOGY ANCILLARY ONLY
Chlamydia: NEGATIVE
Neisseria Gonorrhea: NEGATIVE

## 2019-01-16 LAB — CBC
HCT: 33.6 % — ABNORMAL LOW (ref 39.0–52.0)
Hemoglobin: 11.8 g/dL — ABNORMAL LOW (ref 13.0–17.0)
MCH: 29.8 pg (ref 26.0–34.0)
MCHC: 35.1 g/dL (ref 30.0–36.0)
MCV: 84.8 fL (ref 80.0–100.0)
Platelets: 168 10*3/uL (ref 150–400)
RBC: 3.96 MIL/uL — ABNORMAL LOW (ref 4.22–5.81)
RDW: 18.4 % — ABNORMAL HIGH (ref 11.5–15.5)
WBC: 5.2 10*3/uL (ref 4.0–10.5)
nRBC: 0 % (ref 0.0–0.2)

## 2019-01-16 LAB — MAGNESIUM: Magnesium: 1.7 mg/dL (ref 1.7–2.4)

## 2019-01-16 LAB — APTT: aPTT: 51 seconds — ABNORMAL HIGH (ref 24–36)

## 2019-01-16 LAB — HIV-1 RNA QUANT-NO REFLEX-BLD
HIV 1 RNA Quant: 490 copies/mL — ABNORMAL HIGH
HIV-1 RNA Quant, Log: 2.69 Log copies/mL — ABNORMAL HIGH

## 2019-01-16 LAB — RPR: RPR Ser Ql: NONREACTIVE

## 2019-01-16 MED ORDER — POTASSIUM CHLORIDE 20 MEQ PO PACK
40.0000 meq | PACK | Freq: Two times a day (BID) | ORAL | Status: DC
  Administered 2019-01-16 – 2019-01-17 (×2): 40 meq via ORAL
  Filled 2019-01-16 (×4): qty 2

## 2019-01-16 MED ORDER — HEPARIN SODIUM (PORCINE) 5000 UNIT/ML IJ SOLN
5000.0000 [IU] | Freq: Three times a day (TID) | INTRAMUSCULAR | Status: DC
  Filled 2019-01-16 (×3): qty 1

## 2019-01-16 MED ORDER — SULFAMETHOXAZOLE-TRIMETHOPRIM 400-80 MG PO TABS
1.0000 | ORAL_TABLET | Freq: Every day | ORAL | Status: DC
  Administered 2019-01-17: 1 via ORAL
  Filled 2019-01-16: qty 1

## 2019-01-16 MED ORDER — SULFAMETHOXAZOLE-TRIMETHOPRIM 800-160 MG PO TABS
1.0000 | ORAL_TABLET | Freq: Every day | ORAL | Status: DC
  Administered 2019-01-16: 1 via ORAL
  Filled 2019-01-16: qty 1

## 2019-01-16 MED ORDER — FLUCONAZOLE 100 MG PO TABS
400.0000 mg | ORAL_TABLET | Freq: Every day | ORAL | Status: DC
  Administered 2019-01-16 – 2019-01-17 (×2): 400 mg via ORAL
  Filled 2019-01-16 (×2): qty 4

## 2019-01-16 NOTE — Progress Notes (Signed)
Called and notified Medical Resident that patient refuses heparin injections even after I educated him on purpose of medication (prevent blood clots given his health condition and decreased activity) and after I answered all questions. Patient stated he was willing to wear SCDs and this was verbalized to resident. Resident also notified that patient's cardiac monitoring order is due to expire today and that he has been stable and QT WNL. Resident stated that she will d/c heparin sq and order SCD. Resident also stated we may remove tele monitor when order expires. This was relayed to patient verbally and he verbalizes understanding.

## 2019-01-16 NOTE — Plan of Care (Signed)
Patient is making progress toward discharge, patient is alert with no sign of distress, K+ 2.5 was replaced on this shift. No signs and symptoms of infection noted. Will continue to monitor

## 2019-01-16 NOTE — Discharge Summary (Signed)
Family Medicine Teaching [facility] Discharge Summary  Patient name: Justin Ball Medical record number: [id number] Date of birth: 06/23/1991 Age: 28 y.o. Gender: male Date of Admission: 01/15/2019  Date of Discharge: 01/17/2019 Admitting Physician: Moses MannersWilliam A Hensel, MD  Primary Care Provider: Patient, No Pcp Per Consultants: infectious disease  Indication for Hospitalization: elevated LFTs  Discharge Diagnoses/Problem List:  Elevated LFT Hep B Hep C HIV Alcohol use disorder  Disposition: home  Discharge Condition: stable  Discharge Exam:  BP (!) 100/58 (BP Location: Left Arm)   Pulse (!) 102   Temp 98.6 F (37 C) (Oral)   Resp 20   Ht 6\' 1"  (1.854 m)   Wt 80.9 kg   SpO2 97%   BMI 23.54 kg/m  Physical Exam:  Gen: NAD, alert, non-toxic, well-nourished, well-appearing, sitting comfortably  HEENT: Normocephaic, atraumatic. Significant scleral icterus CV: Regular rate and rhythm.  Normal S1-S2.  Normal capillary refill bilaterally. Radial pulses 2+ bilaterally. No bilateral lower extremity edema. Resp: Clear to auscultation bilaterally.  No wheezing, rales, abnormal lung sounds.  No increased work of breathing appreciated. Abd: Nontender and nondistended on palpation to all 4 quadrants.  Positive bowel sounds. Skin: patient has several circular hyperpigmented areas on his arms.  Psych: Cooperative with exam. Pleasant. Makes eye contact. Extremities: Full ROM    Brief Hospital Course:  Justin Ball was admitted for elevated LFTs after a recent discharge for similar complaints in the setting of Hepatitis B and C, HIV, and alcohol use disorder.  He did not have any complaints.  When he arrived in the ED his LFTs were elevated but similar to his levels on previous discharge. Social Work was consulted to help with safe disposition.  Grandmother and mother were involved with decision making and agreed to increased Personal Care Hours at home.  They did not wish for placement  in a group home or assisted living facility.  Patient was stable and discharged to home with grandparents.   Issues for Follow Up:  1. Elevated LFT - consider CMP on follow up to trend LFTs. See if patient is taking his HIV medications.    2. Alcohol use - patient was given materials regarding alcohol cessation programs.  Inquire if patient has had alcohol since discharge and if he needs help with quitting.    Significant Procedures: none  Significant Labs and Imaging:  Recent Labs  Lab 01/14/19 1543 01/15/19 1404 01/16/19 0430  WBC 5.3 5.0 5.2  HGB 13.2 12.2* 11.8*  HCT  --  35.5* 33.6*  PLT 89* 173 168   Recent Labs  Lab 01/14/19 1543 01/15/19 1404 01/15/19 1912 01/16/19 0430 01/17/19 0703  NA 131* 130* 132* 131* 131*  K 3.1* 6.0* 2.5* 3.0* 3.2*  CL 99 104 105 106 107  CO2 22 18* 19* 19* 18*  GLUCOSE 91 99 102* 97 113*  BUN 10 10 8 9 8   CREATININE 1.27 1.42* 1.48* 1.42* 1.42*  CALCIUM 8.6 8.4* 8.3* 8.4* 8.7*  MG  --   --   --  1.7  --   ALKPHOS  --  206*  --  178* 176*  AST 526* 481*  --  467* 395*  ALT 133* 130*  --  124* 113*  ALBUMIN  --  1.9*  --  1.8* 1.7*      Results/Tests Pending at Time of Discharge: none  Discharge Medications:  Allergies as of 01/18/2019      Reactions   Acyclovir And Related Itching  Medication List    TAKE these medications   acetaminophen 325 MG tablet Commonly known as:  TYLENOL Take 2 tablets (650 mg total) by mouth every 4 (four) hours as needed for mild pain (temp > 101.5).   Darunavir-Cobicisctat-Emtricitabine-Tenofovir Alafenamide 800-150-200-10 MG Tabs Commonly known as:  SYMTUZA Take 1 tablet by mouth daily with breakfast.   dolutegravir 50 MG tablet Commonly known as:  TIVICAY Take 1 tablet (50 mg total) by mouth daily.   fluconazole 200 MG tablet Commonly known as:  DIFLUCAN Take 2 tablets (400 mg total) by mouth daily at 6 PM.   levETIRAcetam 1000 MG tablet Commonly known as:  KEPPRA Take 1 tablet  (1,000 mg total) by mouth 2 (two) times daily.   ondansetron 4 MG tablet Commonly known as:  ZOFRAN Take 1 tablet (4 mg total) by mouth every 6 (six) hours as needed for nausea or vomiting.   QUEtiapine 25 MG tablet Commonly known as:  SEROQUEL Take 1 tablet (25 mg total) by mouth 2 (two) times daily as needed (agitation).   sulfamethoxazole-trimethoprim 800-160 MG tablet Commonly known as:  BACTRIM DS,SEPTRA DS Take 1 tablet by mouth daily.   valACYclovir 1000 MG tablet Commonly known as:  VALTREX Take 1 tablet (1,000 mg total) by mouth 3 (three) times daily. What changed:  when to take this       Discharge Instructions: Please refer to Patient Instructions section of EMR for full details.  Patient was counseled important signs and symptoms that should prompt return to medical care, changes in medications, dietary instructions, activity restrictions, and follow up appointments.   Follow-Up Appointments: Follow-up Information    Daiva EvesVan Dam, Lisette Grinderornelius N, MD. Call.   Specialty:  Infectious Diseases Why:  Make hospital follow-up appointment. Contact information: [address] [place] [address] [phone]           Sandre Kittylson,  K, MD 01/19/2019, 9:05 PM PGY-1, [facility]

## 2019-01-16 NOTE — Progress Notes (Signed)
Telemetry removed per expired order. Central [facility] notified.

## 2019-01-16 NOTE — Consult Note (Signed)
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 In the interval since Justin last hospital stay Justin Hep B labs have returned - DNA only 20 which indicates he has had at least decent compliance of Justin medications recently (which is true since he was in the hospital setting getting meds regularly), still HBeAg-positive. HIV RNA up a little to ~400 copies. Continue HIV regimen. He should continue treatment for cryptococcal disease as well with fluconazole. Not certain an easy oral substitute considering Justin LFTs for this - will d/w ID pharm team - considering the severity of Justin cryptococcal disease in the past he needs ongoing treatment.   Due to Justin altered understanding about trajectory of Justin disease in the setting of habits like binge drinking of alcohol--which he does not quite understand is contributing to Justin acute flare of hepatitis--we feel strongly that he would be best served to be placed in a more supervised setting. Although he has been deemed medically unable to make decisions he has yet to have legally appointed guardian. I called to discuss with Case Manager today who will help get Social Work team involved on Justin case. Dr. Daiva EvesVan Dam spent a lot of time discussing this with Justin Ball and Justin Ball earlier this week in clinic.    Plan: 1. Continue Symtuza + Tivicay  2. Would reduce bactrim to 1 single strength tablet daily with kidney function 3. Defer Hep C tx for now 4. Appreciate FMTS and CM/SW team for consideration  of placement.   Principal Problem:   Liver failure (HCC) Active Problems:   AIDS (acquired immune deficiency syndrome) (HCC)   History of cryptococcal meningitis   Chronic viral hepatitis B without coma and with delta agent (HCC)   Hypokalemia   Hepatitis C virus infection without hepatic coma   . Darunavir-Cobicisctat-Emtricitabine-Tenofovir Alafenamide  1 tablet Oral Q breakfast  . dolutegravir  50 mg Oral Daily  . levETIRAcetam  1,000 mg Oral BID  . potassium chloride  40 mEq Oral BID  . sodium chloride flush  3 mL Intravenous Q12H  . sulfamethoxazole-trimethoprim  1 tablet Oral Daily    HPI: Justin Ball is a 28 y.o. male with advanced HIV infection (CD4 120, VL ~400), Chronic Hepatitis B and Acutely acquired Hepatitis C (1a, untreated). 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 Justin meningitis caused some degree of cognitive impairment that is ongoing despite better control over Justin HIV with treatment (although still interrupted therapy d/t environmental issues discussed below). He also has a history of bipolar disorder. He developed a VP shunt infection and the shunt was removed in January 2019.  He was hospitalized again in October of 2019 with headache and blurred vision after he was lost to care and left Justin Ball's home, presumptively off HAART. Spinal tap revealed 1 white blood cell and  slightly elevated protein with negative CSF culture and positive cryptococcal antigen (higher titer). He underwent a second round of induction therapy with IV amphotericin and 5 flucytosine and transitioned back on fluconazole along with Justin antiretroviral regimen of [place] and Tivicay with arranged outpatient follow up with Dr. Orvan Falconer.   Required another hospitalization January with worsening abdominal pain and liver function tests, nausea/vomiting and blurry vision  with intermittent headaches again. LP was obtained again and revealed no signs of meningitis and Cryptococcal Ag titer that was positive but improved - continued on fluconazole consolidation therapy. He was changed to Tivicay + Symtuza to try to offer him a regimen not completely dependent on food requirement for absorption. He was discharged back to Justin Ball's house on 01/11/19 with ongoingly elevated LFTs but better controlled symptoms of nausea/abd pain.   He was Dr. [name] in the office on 1/21 and was found to have ongoing jaundice, abd pain and with report of binge drinking behaviors. Recommended admission for observation with worsening liver function tests and hopes to get him into a more supervised setting to help avoid further self-destructive behaviors to Justin health due to Justin poor insight/understanding of Justin condition and trajectory d/t previous neurologic damage sustained in the summer of 2018 as detailed above.   [name] tells me he "feels fine and the same as he did when he left." He also does not recall who I am, despite numerous meetings in the hospital and clinic setting.  Review of Systems  Constitutional: Negative for chills and fever.  HENT: Negative for tinnitus.   Eyes: Negative for blurred vision and photophobia.  Respiratory: Negative for cough and sputum production.   Cardiovascular: Negative for chest pain.  Gastrointestinal: Negative for diarrhea, nausea and vomiting.  Genitourinary: Negative for dysuria.  Skin: Negative for rash.  Neurological: Negative for headaches.    Past Medical History:  Diagnosis Date  . AIDS due to HIV-I [facility]) infectious disease--- dr Zenaida Niece dam   CD4=50 on 08-02-2017  . Alcoholic hepatitis 01/14/2019  . Anxiety   . 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 Bipolar 1 disorder (HCC)   . Chronic viral hepatitis B without coma  and with delta agent (HCC)   . Cognitive communication deficit    working w/ therapy  . 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 Depression   . 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 Fecal incontinence 11/19/2017  . Foley catheter in place   . 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 History of scabies 2013  . History of syphilis   . 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 Nausea and vomiting 01/14/2019  . 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 Neurogenic bowel    wears depends  . Neuromuscular disorder (HCC)   . 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 Seizure disorder [facility])    neurologist-  dr Marjory Lies ([facility])--caused by cryptococcal meningitis/ multiple infarctions--  no seizure since hospitalization May 2018  . Status epilepticus (HCC)   . Visuospatial deficit     Social History   Tobacco Use  .  Smoking status: Current Every Day Smoker    Packs/day: 0.50    Years: 5.00    Pack years: 2.50    Types: Cigarettes  . Smokeless tobacco: Never Used  . Tobacco comment: cutting back  Substance Use Topics  . Alcohol use: Yes  . Drug use: No    Family History  Problem Relation Age of Onset  . Hypertension Other    Allergies  Allergen Reactions  . Acyclovir And Related Itching    OBJECTIVE: Blood pressure 97/61, pulse 96, temperature 98.8 F (37.1 C), temperature source Oral, resp. rate 18, height 6\' 1"  (1.854 m), weight 79.7 kg, SpO2 97 %.  Physical Exam HENT:     Nose: No congestion.     Mouth/Throat:     Mouth: Mucous membranes are dry.  Eyes:     General: Scleral icterus present.     Pupils: Pupils are  equal, round, and reactive to light.  Cardiovascular:     Rate and Rhythm: Normal rate and regular rhythm.     Heart sounds: No murmur.  Abdominal:     General: Abdomen is flat.     Palpations: There is no mass.     Tenderness: There is no abdominal tenderness.  Musculoskeletal:        General: No swelling.  Skin:    Coloration: Skin is jaundiced.     Comments: Multiple hyperpigmented, flat macules on arms/skin.   Neurological:     Mental Status: He is alert and oriented to person, place, and time. Mental status is at baseline.     Cranial Nerves: No cranial nerve deficit.     Gait: Gait normal.  Psychiatric:        Attention and Perception: He is inattentive.        Mood and Affect: Affect is flat.        Cognition and Memory: Memory is impaired.        Judgment: Judgment is impulsive.     Lab Results Lab Results  Component Value Date   WBC 5.2 01/16/2019   HGB 11.8 (L) 01/16/2019   HCT 33.6 (L) 01/16/2019   MCV 84.8 01/16/2019   PLT 168 01/16/2019    Lab Results  Component Value Date   CREATININE 1.42 (H) 01/16/2019   BUN 9 01/16/2019   NA 131 (L) 01/16/2019   K 3.0 (L) 01/16/2019   CL 106 01/16/2019   CO2 19 (L) 01/16/2019    Lab Results  Component Value Date   ALT 124 (H) 01/16/2019   AST 467 (H) 01/16/2019   ALKPHOS 178 (H) 01/16/2019   BILITOT 24.2 (HH) 01/16/2019     Microbiology: Recent Results (from the past 240 hour(s))  Culture, blood (routine x 2)     Status: None   Collection Time: 01/08/19  8:33 AM  Result Value Ref Range Status   Specimen Description BLOOD RIGHT ANTECUBITAL  Final   Special Requests   Final    BOTTLES DRAWN AEROBIC AND ANAEROBIC Blood Culture adequate volume   Culture   Final    NO GROWTH 5 DAYS Performed at [facility], [address] [place], [name] [address]    Report Status 01/13/2019 FINAL  Final  Culture, blood (routine x 2)     Status: None   Collection Time: 01/08/19  8:37 AM  Result Value Ref Range  Status   Specimen Description BLOOD RIGHT HAND  Final   Special Requests   Final    BOTTLES  DRAWN AEROBIC AND ANAEROBIC Blood Culture adequate volume   Culture   Final    NO GROWTH 5 DAYS Performed at [facility], [address] [place], [place] [address]    Report Status 01/13/2019 FINAL  Final  Fungus Culture With Stain     Status: None (Preliminary result)   Collection Time: 01/08/19 11:48 AM  Result Value Ref Range Status   Fungus Stain Final report  Final    Comment: (NOTE) Performed At: [facility] [place] [address] [place], [place] [id number] Jolene SchimkeNagendra Sanjai MD XB:[id number]:[phone]    Fungus (Mycology) Culture PENDING  Incomplete   Fungal Source CSF  Final    Comment: Performed at [facility], [address] [place], [place] [address]  Fungus Culture Result     Status: None   Collection Time: 01/08/19 11:48 AM  Result Value Ref Range Status   Result 1 Comment  Final    Comment: (NOTE) KOH/Calcofluor preparation:  no fungus observed. Performed At: [facility] [place] [address] [place], [place] [id number] Jolene SchimkeNagendra Sanjai MD MW:[id number]:[phone]   CSF culture     Status: None   Collection Time: 01/08/19 11:53 AM  Result Value Ref Range Status   Specimen Description CSF  Final   Special Requests Immunocompromised  Final   Gram Stain   Final    CYTOSPIN SMEAR WBC PRESENT, PREDOMINANTLY MONONUCLEAR NO ORGANISMS SEEN    Culture   Final    NO GROWTH 3 DAYS Performed at [facility], [address] [place], [place] [address]    Report Status 01/12/2019 FINAL  Final  MRSA PCR Screening     Status: None   Collection Time: 01/15/19  7:04 PM  Result Value Ref Range Status   MRSA by PCR NEGATIVE NEGATIVE Final    Comment:        The GeneXpert MRSA Assay (FDA approved for NASAL specimens only), is one component of a comprehensive MRSA colonization surveillance program. It is not intended to diagnose MRSA infection nor to guide or monitor treatment  for MRSA infections. Performed at [facility], [address] [place], [place] [address]     Rexene AlbertsStephanie Boruch Manuele, MSN, NP-C [facility] Cell: [phone] Pager: [phone]  01/16/2019 9:51 AM

## 2019-01-16 NOTE — Progress Notes (Signed)
Family Medicine Teaching Service Daily Progress Note Intern Pager: [phone]  Patient name: Justin Ball Medical record number: [id number] Date of birth: 11-30-1991 Age: 28 y.o. Gender: male  Primary Care Provider: Patient, No Pcp Per Consultants: ID Code Status: Full code  Pt Overview and Major Events to Date:  Hospital Day 1 Admitted: 01/15/2019   Assessment and Plan: Justin Ball is a 28 y.o. male presenting with elevated liver enzymes concerning for liver failure . PMH is significant for HIV, Hep B, Hep C, alcoholism, bipolar disorder  Elevated Transaminases w/ hyperbilirbuinemia. - LFTs improving, similar now to when he was discharged previously.  This morning patient feels great, no abdominal pain or TTP.  Will await ID recs.  - Infectious Disease consulted, appreciate recs - GI consulted, appreciate recomendations - morning CBC, BMP, INR, PTT - continuous cardiac monitoring  - monitor I/Os - vitals q4 - renal diet w/ fluid restriction  - incentive spirometry - start heparin for prophylaxis.  - CSW consult   HIV/AIDS - patient now takes [place] and Tivicay daily as of last week d/t poor adherence when having to take with meals.  at home.  CD4 % is 10. [name]  - continue new home meds  Prolonged QT - QTc 455 on recent EKG during last admission.  - hold QT prolonging meds - am EKG  HIV Dementia  Bipolar disorder and agitation - takes seroquel 25mg  BID PRN at home.Per psych note on 1/16: He should follow up with his outpatient provider for medication management. He lacks capacity to make decisions regarding his current medical conditions due to lack of understanding of his medical conditions.patient's grandmom is decision maker but not currently HCPOA - continue seroquel  H/o cryptococcal menengoencephalitis w/ resultant seizures - Developed severe cryptococcal meningitis in May 2018s/p ventriculostomy  04/2017 and VP shunt 05/2017, removed 12/2017 after infection leading to sepsis.Treated in October 2019 for recurrent CM with 14 day course ofIV amphotericin and flucytosine. He followed up with Dr. Orvan Falconer with ID on 11/28/2018 and was continued on Fluconazole and Keppra and was to follow up on 1/28 with Dr. [name]. - continue home keppra - restart fluconazole  Alcohol use disorder - patient was recently hospitalized and discharged 5 days ago.  He reportedly consumed alcohol the first night he returned home but has not since. Not necessary to put on CIWA at this time as he just returned from a hospitalization and has only drank one time since then.  - monitor for signs of withdrawal, treat if necessary.   Tobacco use - consider nicotine patch if needed.   FEN/GI: fluid restricted diet.  Prophylaxis: SCDs  Disposition: home   Medications: Scheduled Meds: . Darunavir-Cobicisctat-Emtricitabine-Tenofovir Alafenamide  1 tablet Oral Q breakfast  . dolutegravir  50 mg Oral Daily  . levETIRAcetam  1,000 mg Oral BID  . potassium chloride  40 mEq Oral BID  . sodium chloride flush  3 mL Intravenous Q12H   Continuous Infusions: . sodium chloride 250 mL (01/15/19 1707)   PRN Meds: sodium chloride, nicotine, polyethylene glycol, QUEtiapine, sodium chloride flush  ================================================= ================================================= Subjective:  Patient feels great today.  Has felt the same since his last discharge on Saturday.  Denies any complaints.    Objective: Temp:  [98.2 F (36.8 C)-98.9 F (37.2 C)] 98.8 F (37.1 C) (01/23 0513) Pulse Rate:  [87-102] 96 (01/23 0513) Resp:  [16-18] 18 (01/23 0513) BP: (  97-129)/(36-81) 97/61 (01/23 0513) SpO2:  [94 %-100 %] 97 % (01/23 0513) Weight:  [71.7 kg-79.7 kg] 79.7 kg (01/22 1653) Intake/Output 01/22 0701 - 01/23 0700 In: 272.6 [I.V.:72.6; IV Piggyback:200] Out: -  Physical Exam:  Gen: NAD, alert,  non-toxic, well-nourished, well-appearing, sitting comfortably  HEENT: Normocephaic, atraumatic. Significant scleral icterus CV: Regular rate and rhythm.  Normal S1-S2.  Normal capillary refill bilaterally. Radial pulses 2+ bilaterally. No bilateral lower extremity edema. Resp: Clear to auscultation bilaterally.  No wheezing, rales, abnormal lung sounds.  No increased work of breathing appreciated. Abd: Nontender and nondistended on palpation to all 4 quadrants.  Positive bowel sounds. Skin: patient has several circular hyperpigmented areas on his arms.  Psych: Cooperative with exam. Pleasant. Makes eye contact. Extremities: Full ROM    Laboratory: Recent Labs  Lab 01/14/19 1543 01/15/19 1404 01/16/19 0430  WBC 5.3 5.0 5.2  HGB 13.2 12.2* 11.8*  HCT  --  35.5* 33.6*  PLT 89* 173 168   Recent Labs  Lab 01/11/19 1039 01/14/19 1543 01/15/19 1404 01/15/19 1912 01/16/19 0430  NA 132* 131* 130* 132* 131*  K 3.2* 3.1* 6.0* 2.5* 3.0*  CL 104 99 104 105 106  CO2 18* 22 18* 19* 19*  BUN 9 10 10 8 9   CREATININE 1.49* 1.27 1.42* 1.48* 1.42*  CALCIUM 9.3 8.6 8.4* 8.3* 8.4*  PROT 9.3* 9.7* RESULTS UNAVAILABLE DUE TO INTERFERING SUBSTANCE  --  9.8*  BILITOT 22.3* 26.2* 22.9*  --  24.2*  ALKPHOS 183*  --  206*  --  178*  ALT 112* 133* 130*  --  124*  AST 408* 526* 481*  --  467*  GLUCOSE 94 91 99 102* 97    Imaging/Diagnostic Tests: No results found.   Sandre Kitty, MD 01/16/2019, 6:26 AM PGY-1, [facility] Family Medicine FPTS Intern pager: [phone], text pages welcome

## 2019-01-17 LAB — COMPREHENSIVE METABOLIC PANEL
ALBUMIN: 1.7 g/dL — AB (ref 3.5–5.0)
ALT: 113 U/L — ABNORMAL HIGH (ref 0–44)
AST: 395 U/L — ABNORMAL HIGH (ref 15–41)
Alkaline Phosphatase: 176 U/L — ABNORMAL HIGH (ref 38–126)
Anion gap: 6 (ref 5–15)
BUN: 8 mg/dL (ref 6–20)
CO2: 18 mmol/L — ABNORMAL LOW (ref 22–32)
Calcium: 8.7 mg/dL — ABNORMAL LOW (ref 8.9–10.3)
Chloride: 107 mmol/L (ref 98–111)
Creatinine, Ser: 1.42 mg/dL — ABNORMAL HIGH (ref 0.61–1.24)
GFR calc Af Amer: >60 mL/min (ref >60)
GFR calc non Af Amer: >60 mL/min (ref >60)
GLUCOSE: 113 mg/dL — AB (ref 70–99)
Potassium: 3.2 mmol/L — ABNORMAL LOW (ref 3.5–5.1)
SODIUM: 131 mmol/L — AB (ref 135–145)
Total Bilirubin: 21.9 mg/dL (ref 0.3–1.2)
Total Protein: 9.1 g/dL — ABNORMAL HIGH (ref 6.5–8.1)

## 2019-01-17 NOTE — Progress Notes (Signed)
Family Medicine Teaching Service Daily Progress Note Intern Pager: [phone]  Patient name: Justin Ball Medical record number: [id number] Date of birth: Dec 31, 1990 Age: 28 y.o. Gender: male  Primary Care Provider: Patient, No Pcp Per Consultants: ID Code Status: Full code  Pt Overview and Major Events to Date:  Hospital Day 2 Admitted: 01/15/2019   Assessment and Plan: Justin Ball is a 28 y.o. male presenting with elevated liver enzymes concerning for liver failure . PMH is significant for HIV, Hep B, Hep C, alcoholism, bipolar disorder  Elevated Transaminases w/ hyperbilirbuinemia. - LFTs improving, similar now to when he was discharged previously.  - Infectious Disease consulted, appreciate recs - GI consulted, appreciate recomendations - morning CBC, BMP, INR, PTT - monitor I/Os - vitals q4 - renal diet w/ fluid restriction  - incentive spirometry - start heparin for prophylaxis.  - CSW consult   HIV/AIDS - patient now takes [place] and Tivicay daily as of last week d/t poor adherence when having to take with meals.  at home.  CD4 % is 10. [facility]  - continue new home meds - reduce bactrim to 1 tablet qdaily.   Prolonged QT resolved- QTc 455 on recent EKG during last admission. qtc 417 on this EKG.  - hold QT prolonging meds  HIV Dementia  Bipolar disorder and agitation - takes seroquel 25mg  BID PRN at home.Per psych note on 1/16: He should follow up with his outpatient provider for medication management. He lacks capacity to make decisions regarding his current medical conditions due to lack of understanding of his medical conditions.patient's grandmom is decision maker but not currently HCPOA.  ID would like to seek placement in a supervised facility due to lack of capacity. Will f/u with CSW regarding this.  - continue seroquel - plan for family decision snf vs. Home today at 89.    H/o cryptococcal  menengoencephalitis w/ resultant seizures - Developed severe cryptococcal meningitis in May 2018s/p ventriculostomy 04/2017 and VP shunt 05/2017, removed 12/2017 after infection leading to sepsis.Treated in October 2019 for recurrent CM with 14 day course ofIV amphotericin and flucytosine. He followed up with Dr. Orvan Ball with ID on 11/28/2018 and was continued on Fluconazole and Keppra and was to follow up on 1/28 with Dr. Daiva Ball. - continue home keppra - continue fluconazole  Alcohol use disorder - patient was recently hospitalized and discharged 5 days ago.  He reportedly consumed alcohol the first night he returned home but has not since. Not necessary to put on CIWA at this time as he just returned from a hospitalization and has only drank one time since then.  - monitor for signs of withdrawal, treat if necessary.   Tobacco use - consider nicotine patch if needed.   FEN/GI: fluid restricted diet.  Prophylaxis: SCDs  Disposition: home   Medications: Scheduled Meds: . Darunavir-Cobicisctat-Emtricitabine-Tenofovir Alafenamide  1 tablet Oral Q breakfast  . dolutegravir  50 mg Oral Daily  . fluconazole  400 mg Oral q1800  . heparin  5,000 Units Subcutaneous Q8H  . levETIRAcetam  1,000 mg Oral BID  . potassium chloride  40 mEq Oral BID  . sodium chloride flush  3 mL Intravenous Q12H  . sulfamethoxazole-trimethoprim  1 tablet Oral Daily   Continuous Infusions: . sodium chloride 250 mL (01/15/19 1707)   PRN Meds: sodium chloride, nicotine, polyethylene glycol, QUEtiapine, sodium chloride flush  ================================================= ================================================= Subjective:  Patient feels great.  Has no complaints. States he is going to stop drinking alcohol. .    Objective: Temp:  [98.3 F (36.8 C)-98.8 F (37.1 C)] 98.3 F (36.8 C) (01/24 0428) Pulse Rate:  [93-98] 98 (01/24 0428) Resp:  [16-18] 17 (01/24 0428) BP: (87-108)/(48-69) 108/69  (01/24 0428) SpO2:  [98 %-100 %] 100 % (01/24 0428) Weight:  [80.9 kg] 80.9 kg (01/24 0428) Intake/Output 01/23 0701 - 01/24 0700 In: 123 [P.O.:120; I.V.:3] Out: -  Physical Exam:  Gen: NAD, alert, non-toxic, well-nourished, well-appearing, sitting comfortably  HEENT: Normocephaic, atraumatic. Significant scleral icterus CV: Regular rate and rhythm.  Normal S1-S2.  Normal capillary refill bilaterally. Radial pulses 2+ bilaterally. No bilateral lower extremity edema. Resp: Clear to auscultation bilaterally.  No wheezing, rales, abnormal lung sounds.  No increased work of breathing appreciated. Abd: Nontender and nondistended on palpation to all 4 quadrants.  Positive bowel sounds. Skin: patient has several circular hyperpigmented areas on his arms.  Psych: Cooperative with exam. Pleasant. Makes eye contact. Extremities: Full ROM    Laboratory: Recent Labs  Lab 01/14/19 1543 01/15/19 1404 01/16/19 0430  WBC 5.3 5.0 5.2  HGB 13.2 12.2* 11.8*  HCT  --  35.5* 33.6*  PLT 89* 173 168   Recent Labs  Lab 01/11/19 1039 01/14/19 1543 01/15/19 1404 01/15/19 1912 01/16/19 0430  NA 132* 131* 130* 132* 131*  K 3.2* 3.1* 6.0* 2.5* 3.0*  CL 104 99 104 105 106  CO2 18* 22 18* 19* 19*  BUN 9 10 10 8 9   CREATININE 1.49* 1.27 1.42* 1.48* 1.42*  CALCIUM 9.3 8.6 8.4* 8.3* 8.4*  PROT 9.3* 9.7* RESULTS UNAVAILABLE DUE TO INTERFERING SUBSTANCE  --  9.8*  BILITOT 22.3* 26.2* 22.9*  --  24.2*  ALKPHOS 183*  --  206*  --  178*  ALT 112* 133* 130*  --  124*  AST 408* 526* 481*  --  467*  GLUCOSE 94 91 99 102* 97    Imaging/Diagnostic Tests: No results found.   Justin Ball,  K, MD 01/17/2019, 6:48 AM PGY-1, [facility] FPTS Intern pager: [phone], text pages welcome

## 2019-01-17 NOTE — Discharge Instructions (Signed)
You were admitted for having signs of possible liver failure. This appears to be improving now. We suspect your use of alcohol is the reason for this.   YOU MUST AVOID ALL ALCOHOL.  Please follow up with Dr. Algis Liming and take you HIV meds.

## 2019-01-17 NOTE — Progress Notes (Signed)
Patient taken to his grandmother downstairs by the NT on a wheelchair. He was given discharge instructions and went over with him. No issues noted.

## 2019-01-17 NOTE — Progress Notes (Signed)
[name] grandmother is aware of the discharge. She will be here around 11pm to take him home.

## 2019-01-17 NOTE — Progress Notes (Signed)
CSW met with patient's grandmother. She reports that she would like increased Personal Care Hours in the home and community resources so that patient may have socialization. CSW also discussed the possibility of placement for him in a group home or ALF setting. She stated they do not need that for him at the moment but she does hope he will take his medicines and stop drinking alcohol. CSW provided facility list just in case. She states that his mother and sisters do not help her enough and he needs social interaction. CSW will submit PCS referral form to [facility] and provided her with contact info for community resources. She states she would like him to return to [place], so CSW provided that information as well.   CSW signing off.   Percell Locus Darely Becknell LCSW [phone]

## 2019-01-21 ENCOUNTER — Ambulatory Visit: Payer: Medicaid Other | Admitting: Infectious Disease

## 2019-02-04 LAB — HIV GENOSURE(R) MG

## 2019-02-04 LAB — REFLEX TO GENOSURE(R) MG EDI: HIV GenoSure(R): 1

## 2019-02-04 LAB — HIV-1 RNA, PCR (GRAPH) RFX/GENO EDI
HIV-1 RNA BY PCR: 7780 copies/mL
HIV-1 RNA QUANT, LOG: 3.891 {Log_copies}/mL

## 2019-02-13 LAB — FUNGUS CULTURE WITH STAIN

## 2019-02-13 LAB — FUNGAL ORGANISM REFLEX

## 2019-02-13 LAB — FUNGUS CULTURE RESULT

## 2019-02-24 ENCOUNTER — Other Ambulatory Visit (HOSPITAL_COMMUNITY)
Admission: RE | Admit: 2019-02-24 | Discharge: 2019-02-24 | Disposition: A | Discharge status: Home / Self Care | Payer: Medicaid Other | Source: Ambulatory Visit | Attending: Infectious Disease | Admitting: Infectious Disease

## 2019-02-24 ENCOUNTER — Ambulatory Visit (INDEPENDENT_AMBULATORY_CARE_PROVIDER_SITE_OTHER): Payer: Medicaid Other | Admitting: Infectious Disease

## 2019-02-24 ENCOUNTER — Encounter: Payer: Self-pay | Admitting: Infectious Disease

## 2019-02-24 VITALS — BP 117/78 | HR 92 | Temp 98.0°F | Wt 188.0 lb

## 2019-02-24 DIAGNOSIS — B182 Chronic viral hepatitis C: Secondary | ICD-10-CM | POA: Insufficient documentation

## 2019-02-24 DIAGNOSIS — J069 Acute upper respiratory infection, unspecified: Secondary | ICD-10-CM

## 2019-02-24 DIAGNOSIS — K7200 Acute and subacute hepatic failure without coma: Secondary | ICD-10-CM | POA: Insufficient documentation

## 2019-02-24 DIAGNOSIS — B451 Cerebral cryptococcosis: Secondary | ICD-10-CM | POA: Insufficient documentation

## 2019-02-24 DIAGNOSIS — Z982 Presence of cerebrospinal fluid drainage device: Secondary | ICD-10-CM | POA: Diagnosis present

## 2019-02-24 DIAGNOSIS — B180 Chronic viral hepatitis B with delta-agent: Secondary | ICD-10-CM | POA: Diagnosis not present

## 2019-02-24 DIAGNOSIS — R17 Unspecified jaundice: Secondary | ICD-10-CM | POA: Insufficient documentation

## 2019-02-24 DIAGNOSIS — K7010 Alcoholic hepatitis without ascites: Secondary | ICD-10-CM | POA: Diagnosis present

## 2019-02-24 DIAGNOSIS — G40909 Epilepsy, unspecified, not intractable, without status epilepticus: Secondary | ICD-10-CM

## 2019-02-24 DIAGNOSIS — B20 Human immunodeficiency virus [HIV] disease: Secondary | ICD-10-CM | POA: Insufficient documentation

## 2019-02-24 DIAGNOSIS — J00 Acute nasopharyngitis [common cold]: Secondary | ICD-10-CM | POA: Insufficient documentation

## 2019-02-24 HISTORY — DX: Acute upper respiratory infection, unspecified: J06.9

## 2019-02-24 LAB — COMPREHENSIVE METABOLIC PANEL
ALT: 63 U/L — ABNORMAL HIGH (ref 0–44)
AST: 179 U/L — ABNORMAL HIGH (ref 15–41)
Albumin: 2.2 g/dL — ABNORMAL LOW (ref 3.5–5.0)
Alkaline Phosphatase: 168 U/L — ABNORMAL HIGH (ref 38–126)
Anion gap: 5 (ref 5–15)
BILIRUBIN TOTAL: 4.8 mg/dL — AB (ref 0.3–1.2)
BUN: <5 mg/dL — ABNORMAL LOW (ref 6–20)
CO2: 23 mmol/L (ref 22–32)
Calcium: 8.2 mg/dL — ABNORMAL LOW (ref 8.9–10.3)
Chloride: 109 mmol/L (ref 98–111)
Creatinine, Ser: 1.19 mg/dL (ref 0.61–1.24)
GFR calc Af Amer: >60 mL/min (ref >60)
GFR calc non Af Amer: >60 mL/min (ref >60)
Glucose, Bld: 88 mg/dL (ref 70–99)
Potassium: 4 mmol/L (ref 3.5–5.1)
Sodium: 137 mmol/L (ref 135–145)
Total Protein: 9.5 g/dL — ABNORMAL HIGH (ref 6.5–8.1)

## 2019-02-24 LAB — CBC WITH DIFFERENTIAL/PLATELET
Abs Immature Granulocytes: 0 10*3/uL (ref 0.00–0.07)
Basophils Absolute: 0.1 10*3/uL (ref 0.0–0.1)
Basophils Relative: 1 %
EOS ABS: 0.1 10*3/uL (ref 0.0–0.5)
Eosinophils Relative: 2 %
HEMATOCRIT: 37.2 % — AB (ref 39.0–52.0)
Hemoglobin: 11.8 g/dL — ABNORMAL LOW (ref 13.0–17.0)
Immature Granulocytes: 0 %
Lymphocytes Relative: 37 %
Lymphs Abs: 1.5 10*3/uL (ref 0.7–4.0)
MCH: 31.8 pg (ref 26.0–34.0)
MCHC: 31.7 g/dL (ref 30.0–36.0)
MCV: 100.3 fL — ABNORMAL HIGH (ref 80.0–100.0)
Monocytes Absolute: 0.4 10*3/uL (ref 0.1–1.0)
Monocytes Relative: 9 %
Neutro Abs: 2.1 10*3/uL (ref 1.7–7.7)
Neutrophils Relative %: 51 %
Platelets: 211 10*3/uL (ref 150–400)
RBC: 3.71 MIL/uL — ABNORMAL LOW (ref 4.22–5.81)
RDW: 15.2 % (ref 11.5–15.5)
WBC: 4.1 10*3/uL (ref 4.0–10.5)
nRBC: 0 % (ref 0.0–0.2)

## 2019-02-24 NOTE — Progress Notes (Signed)
Subjective:   Chief complaint: Headache and some cough   Patient ID: Justin Ball, male    DOB: 08-24-1991, 28 y.o.   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  Subsequently had became infected and had to be removed.  Justin Ball been doing relatively well while living with his grandmother who ensured that he took his medications.  Unfortunately though he ran off with a boyfriend even though Justin Ball is not Competent to make such decisions and ultimately contracted hepatitis C was admitted to the hospital for this.  Is been admitted several times with headaches and still had positive cryptococcal antigens and spinal fluid and being reinduced with amphotericin followed by fluconazole.  He then later came to the he came to the ER with a headache and blurry vision.  He underwent lumbar puncture which had a normal opening present pressure but which revealed a positive cryptococcal antigen in CSF and in serum.  Rather than be re-induced with amphotericin he was simply continued on fluconazole.  Upon admission he was found to have markedly elevated liver function tests with an AST of 604 ALT of 142 alkaline phosphatase of 227 and a bilirubin of 23.4.  He had icterus abdominal pain and nausea and vomiting.  He developed acute kidney injury likely due to dehydration while in the hospital.  My partner Dr. Luciana Axe changed his antiretrovirals from [name] and [name] to [name] and [name]  It was unclear to our team or hepatology what was causing his elevation of his transaminases and bilirubin.  Consideration was given to reactivation of hepatitis B if he had been off of his antiretrovirals but his hepatitis B was actually well controlled.  We still not have his viral load back.  Other considerations were an  immune reconstitution syndrome to hepatitis B.  His hepatitis C was certainly active as well.  Ultimately his liver function test stabilized and his creatinine improved.  He was discharged to home.  Of note he also developed a pruritic rash on his back and arms.  Since discharge in the hospital he has been vomiting multiple times per day as he does not have any anti-nausea medicines  In talking to him and his grandmother today the grandmother disclosed to me that he has been purchasing and by being large amounts of alcohol over the week to 2 weeks prior to his admission.  No doubt that he  was drinking up to a bottle of wine per day and up to 3 large cans or bottles of malt liquor when he was not drinking wine  Back then that his acute liver injury was due to to alcohol ingestion superimposed upon chronic hepatitis C B and HIV.  Was very concerned that he will not be able to be safely managed at home.  Apparently he did drink further after discharge from the hospital though not in the quantities that he was drinking before.  I have told him that he cannot drink at all right now I am concerned though that his grandmother will not be able to prevent him from drinking.  I proposed admission today but mother wanted to give him a chance at home first.  Therefore we will see him back in 1 week's time provided his liver function test and labs in today or not becoming  dramatically worse.  Drawing lab work on him in January which then came back with even worse liver function tests we had him admitted to the hospital to the family medicine service with consideration of placing him in a long-term care facility was considered not competent to take care of himself.  Ultimately however decision was made for him to go back home with his grandmother.  According to the patient and his grandmother he is not been drinking alcohol since then.  He does complain of a sore throat and cough in the last few days with  feeling cold.  He has a 2 out of 10 frontal headache which comes and goes.       Past Medical History:  Diagnosis Date  . 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 Alcoholic hepatitis 01/14/2019  . Anxiety   . 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 Bipolar 1 disorder (HCC)   . 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 Cognitive communication deficit    working w/ therapy  . 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 Depression   . 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 Fecal incontinence 11/19/2017  . Foley catheter in place   . 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 History of scabies 2013  . History of syphilis   . 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 Nausea and vomiting 01/14/2019  . 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 Neurogenic bowel    wears depends  . Neuromuscular disorder (HCC)   . 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 Seizure disorder [facility])    neurologist-  dr Marjory Lies ([facility])--caused by cryptococcal meningitis/ multiple infarctions--  no seizure since hospitalization May 2018  . Status epilepticus (HCC)   . Visuospatial deficit     Past Surgical History:  Procedure Laterality Date  . CYSTOSCOPY N/A 10/10/2017   Procedure: CYSTOSCOPY;  Surgeon: Sebastian Ache, MD;  Location: [facility];  Service: Urology;  Laterality: N/A;  . EXAMINATION UNDER ANESTHESIA N/A 12/28/2014   Procedure: EXAM UNDER  ANESTHESIA;  Surgeon: Karie Soda, MD;  Location: [facility];  Service: General;  Laterality: N/A;  . FLOOR OF MOUTH BIOPSY N/A 09/16/2016   Procedure: BIOPSY OF ORAL ABCESS;  Surgeon: Newman Pies, MD;  Location: [facility];  Service: ENT;  Laterality: N/A;  . INCISION AND DRAINAGE ABSCESS N/A 09/16/2016   Procedure: INCISION AND DRAINAGE ABSCESS;  Surgeon: Newman Pies, MD;  Location: [facility];  Service: ENT;  Laterality: N/A;  . INCISION AND DRAINAGE PERIRECTAL ABSCESS  09/ 03/ 2011   dr toth  . INCISION AND DRAINAGE PERIRECTAL ABSCESS N/A 12/28/2014   Procedure: IRRIGATION AND DEBRIDEMENT PERIRECTAL ABSCESS;  Surgeon: Karie Soda, MD;  Location: [facility];  Service: General;  Laterality: N/A;  Fistula repair and ablation  . INSERTION OF SUPRAPUBIC CATHETER N/A 10/10/2017   Procedure: INSERTION OF SUPRAPUBIC CATHETER;  Surgeon: Sebastian Ache, MD;  Location: [facility];  Service: Urology;  Laterality:  N/A;  . LAPAROSCOPIC REVISION VENTRICULAR-PERITONEAL (V-P) SHUNT N/A 06/12/2017   Procedure: LAPAROSCOPIC REVISION VENTRICULAR-PERITONEAL (V-P) SHUNT;  Surgeon: Kinsinger, De Blanch, MD;  Location: [facility];  Service: General;  Laterality: N/A;  . SHUNT REMOVAL N/A 01/01/2018   Procedure: REMOVAL OF V-P SHUNT, PLACEMENT OF EVD;  Surgeon: [name], MD;  Location: [facility];  Service: Neurosurgery;  Laterality: N/A;  . TRANSTHORACIC ECHOCARDIOGRAM  06/02/2017   EF 65-70%/  trivial PR  . VENTRICULOPERITONEAL SHUNT N/A 06/12/2017   Procedure: SHUNT INSERTION VENTRICULAR-PERITONEAL;  Surgeon: [name], MD;  Location: [facility];  Service: Neurosurgery;  Laterality: N/A;    Family History  Problem Relation Age of Onset  . Hypertension Other       Social History   Socioeconomic History  . Marital status: Single    Spouse name: Not on file  . Number of children: Not on file  . Years of education: Not on file  . Highest education level: Not on file  Occupational History  . Not on file  Social  Needs  . Financial resource strain: Not on file  . Food insecurity:    Worry: Not on file    Inability: Not on file  . Transportation needs:    Medical: Not on file    Non-medical: Not on file  Tobacco Use  . Smoking status: Current Every Day Smoker    Packs/day: 0.50    Years: 5.00    Pack years: 2.50    Types: Cigarettes  . Smokeless tobacco: Never Used  . Tobacco comment: cutting back  Substance and Sexual Activity  . Alcohol use: Yes  . Drug use: No  . Sexual activity: Yes    Partners: Male    Birth control/protection: Condom    Comment: irregular condom use; educated  Lifestyle  . Physical activity:    Days per week: Not on file    Minutes per session: Not on file  . Stress: Not on file  Relationships  . Social connections:    Talks on phone: Not on file    Gets together: Not on file    Attends religious service: Not on file    Active member of club or organization: Not on file    Attends meetings of clubs or organizations: Not on file    Relationship status: Not on file  Other Topics Concern  . Not on file  Social History Narrative  . Not on file    Allergies  Allergen Reactions  . Acyclovir And Related Itching     Current Outpatient Medications:  .  acetaminophen (TYLENOL) 325 MG tablet, Take 2 tablets (650 mg total) by mouth every 4 (four) hours as needed for mild pain (temp > 101.5)., Disp: , Rfl:  .  Darunavir-Cobicisctat-Emtricitabine-Tenofovir Alafenamide (SYMTUZA) 800-150-200-10 MG TABS, Take 1 tablet by mouth daily with breakfast., Disp: 30 tablet, Rfl: 0 .  dolutegravir (TIVICAY) 50 MG tablet, Take 1 tablet (50 mg total) by mouth daily., Disp: 30 tablet, Rfl: 0 .  levETIRAcetam (KEPPRA) 1000 MG tablet, Take 1 tablet (1,000 mg total) by mouth 2 (two) times daily., Disp: 60 tablet, Rfl: 5 .  ondansetron (ZOFRAN) 4 MG tablet, Take 1 tablet (4 mg total) by mouth every 6 (six) hours as needed for nausea or vomiting., Disp: 120 tablet, Rfl: 2 .   QUEtiapine (SEROQUEL) 25 MG tablet, Take 1 tablet (25 mg total) by mouth 2 (two) times daily as needed (agitation)., Disp: 60 tablet, Rfl: 4 .  valACYclovir (VALTREX)  1000 MG tablet, Take 1 tablet (1,000 mg total) by mouth 3 (three) times daily. (Patient taking differently: Take 1,000 mg by mouth every Monday, Wednesday, and Friday. ), Disp: 30 tablet, Rfl: 0 .  fluconazole (DIFLUCAN) 200 MG tablet, Take 2 tablets (400 mg total) by mouth daily at 6 PM. (Patient not taking: Reported on 01/15/2019), Disp: 112 tablet, Rfl: 0 .  sulfamethoxazole-trimethoprim (BACTRIM DS,SEPTRA DS) 800-160 MG tablet, Take 1 tablet by mouth daily. (Patient not taking: Reported on 01/15/2019), Disp: 30 tablet, Rfl: 0   Review of Systems  Unable to perform ROS: Dementia       Objective:   Physical Exam  Constitutional: No distress.  HENT:  Head: Normocephalic.    Mouth/Throat: Oropharynx is clear and moist.  Eyes: EOM are normal. Right eye exhibits no discharge.  Neck: Normal range of motion. No JVD present. No tracheal deviation present.  Cardiovascular: Normal rate, regular rhythm and normal heart sounds. Exam reveals no friction rub.  No murmur heard. Pulmonary/Chest: Effort normal and breath sounds normal. No respiratory distress. He has no wheezes. He has no rales. He exhibits no tenderness.  Abdominal: Soft. Bowel sounds are normal. He exhibits no distension and no mass. There is abdominal tenderness in the epigastric area. There is no rebound and no guarding.  Musculoskeletal: Normal range of motion.        General: No deformity or edema.  Neurological: He is alert. He has normal strength. No cranial nerve deficit or sensory deficit. Gait normal.  Oriented to person and place and more oriented than in the past  Skin: Rash noted. He is not diaphoretic. No erythema.  Psychiatric: His speech is delayed. He is slowed. Cognition and memory are impaired. He exhibits a depressed mood.        Assessment &  Plan:   Fulminant hepatitis : Looks to have improved based on exam but will get stat labs today.  Hopefully is stop drinking altogether.  Hopefully his liver function test comes down and we can actually safely treat his chronic hepatitis C without hepatic coma while making sure that his hepatitis B is under control along with his HIV.  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  HIV disease: Continue TIVICAY on [place]tu[place] checking labs today  Chronic hepatitis B without delta agent: His hep B viral load was only 20 copies so this seems to be well well controlled and would suggest that he is likely taking his antiretrovirals at least prior to admission to the hospital.  Chronic hepatitis C: Like to treat this we will check a fibro-sure today  Cryptococcal meningitis: We will continue his fluconazole his opening pressures were surprisingly not high.  Blurry vision: Would like to have him seen by ophthalmology   Cough with slight headache: I suspect this is due to a viral illness he is outside the window where Tamiflu would benefit him if he is not coming into the inpatient world.  Certainly always been anxious about his prior cryptococcal meningitis but clinically does not seem very likely to be the case right now.  I spent greater than 25 minutes with the patient including greater than 50% of time in face to face counsel of the patient his grandmother regarding his need to avoid alcohol and to treat his multiple had a toxic viruses and in coordination of his care.

## 2019-02-25 LAB — URINE CYTOLOGY ANCILLARY ONLY
Chlamydia: NEGATIVE
Neisseria Gonorrhea: NEGATIVE

## 2019-02-26 LAB — T-HELPER CELL (CD4) - (RCID CLINIC ONLY)
CD4 % Helper T Cell: 13 % — ABNORMAL LOW (ref 33–55)
CD4 T Cell Abs: 200 /uL — ABNORMAL LOW (ref 400–2700)

## 2019-02-27 LAB — LIVER FIBROSIS, FIBROTEST-ACTITEST
ALT: 57 U/L — ABNORMAL HIGH (ref 9–46)
Alpha-2-Macroglobulin: 273 mg/dL (ref 106–279)
Apolipoprotein A1: 108 mg/dL (ref 94–176)
Bilirubin: 4.5 mg/dL — ABNORMAL HIGH (ref 0.2–1.2)
Fibrosis Score: 0.92
GGT: 147 U/L — ABNORMAL HIGH (ref 3–70)
Haptoglobin: 28 mg/dL — ABNORMAL LOW (ref 43–212)
Necroinflammat ACT Score: 0.57
Reference ID: 2883662

## 2019-02-27 LAB — HIV-1 RNA QUANT-NO REFLEX-BLD
HIV 1 RNA Quant: 21 copies/mL — ABNORMAL HIGH
HIV-1 RNA Quant, Log: 1.32 Log copies/mL — ABNORMAL HIGH

## 2019-02-27 LAB — PROTIME-INR
INR: 1.2 — ABNORMAL HIGH
PROTHROMBIN TIME: 12.6 s — AB (ref 9.0–11.5)

## 2019-02-27 LAB — RPR: RPR: NONREACTIVE

## 2019-03-25 ENCOUNTER — Other Ambulatory Visit: Payer: Self-pay

## 2019-03-25 ENCOUNTER — Ambulatory Visit (INDEPENDENT_AMBULATORY_CARE_PROVIDER_SITE_OTHER): Payer: Medicaid Other | Admitting: Infectious Disease

## 2019-03-25 ENCOUNTER — Encounter: Payer: Self-pay | Admitting: Infectious Disease

## 2019-03-25 DIAGNOSIS — G40909 Epilepsy, unspecified, not intractable, without status epilepticus: Secondary | ICD-10-CM

## 2019-03-25 DIAGNOSIS — F3112 Bipolar disorder, current episode manic without psychotic features, moderate: Secondary | ICD-10-CM

## 2019-03-25 DIAGNOSIS — K7210 Chronic hepatic failure without coma: Secondary | ICD-10-CM

## 2019-03-25 DIAGNOSIS — B20 Human immunodeficiency virus [HIV] disease: Secondary | ICD-10-CM | POA: Diagnosis not present

## 2019-03-25 DIAGNOSIS — K7010 Alcoholic hepatitis without ascites: Secondary | ICD-10-CM

## 2019-03-25 DIAGNOSIS — B180 Chronic viral hepatitis B with delta-agent: Secondary | ICD-10-CM | POA: Diagnosis not present

## 2019-03-25 DIAGNOSIS — B182 Chronic viral hepatitis C: Secondary | ICD-10-CM | POA: Diagnosis not present

## 2019-03-25 DIAGNOSIS — Z982 Presence of cerebrospinal fluid drainage device: Secondary | ICD-10-CM

## 2019-03-25 MED ORDER — SULFAMETHOXAZOLE-TRIMETHOPRIM 800-160 MG PO TABS: 1.0000 | ORAL_TABLET | Freq: Every day | ORAL | 11 refills | Status: DC

## 2019-03-25 MED ORDER — DARUN-COBIC-EMTRICIT-TENOFAF 800-150-200-10 MG PO TABS: 1.0000 | ORAL_TABLET | Freq: Every day | ORAL | 11 refills | Status: AC

## 2019-03-25 MED ORDER — LEVETIRACETAM 1000 MG PO TABS: 1000.0000 mg | ORAL_TABLET | Freq: Two times a day (BID) | ORAL | 11 refills | Status: AC

## 2019-03-25 MED ORDER — FLUCONAZOLE 200 MG PO TABS: 200.0000 mg | ORAL_TABLET | Freq: Every day | ORAL | 11 refills | Status: DC

## 2019-03-25 MED ORDER — DOLUTEGRAVIR SODIUM 50 MG PO TABS: 50.0000 mg | ORAL_TABLET | Freq: Every day | ORAL | 11 refills | Status: AC

## 2019-03-25 MED ORDER — QUETIAPINE FUMARATE 25 MG PO TABS: 25.0000 mg | ORAL_TABLET | Freq: Two times a day (BID) | ORAL | 11 refills | Status: AC | PRN

## 2019-03-25 MED ORDER — ONDANSETRON HCL 4 MG PO TABS: 4.0000 mg | ORAL_TABLET | Freq: Four times a day (QID) | ORAL | 2 refills | Status: DC | PRN

## 2019-03-25 NOTE — Progress Notes (Signed)
Virtual Visit via Telephone Note  I connected with Justin Ball on 03/25/19 at 10:30 AM EDT by telephone and verified that I am speaking with the correct person using two identifiers.   I discussed the limitations, risks, security and privacy concerns of performing an evaluation and management service by telephone and the availability of in person appointments. I also discussed with the patient that there may be a patient responsible charge related to this service. The patient expressed understanding and agreed to proceed.   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  Subsequently had became infected and had to be removed.  Justin Ball been doing relatively well while living with his grandmother who ensured that he took his medications.  Unfortunately though he ran off with a boyfriend even though Justin Ball is not Competent to make such decisions and ultimately contracted hepatitis C was admitted to the hospital for this.  Is been admitted several times with headaches and still had positive cryptococcal antigens and spinal fluid and being reinduced with amphotericin followed by fluconazole.  He then later came to the he came to the ER with a headache and blurry vision.  He underwent lumbar puncture which had a normal opening present pressure but which revealed a positive cryptococcal antigen in CSF and in serum.  Rather than be re-induced with amphotericin he was simply continued on fluconazole.  Upon admission he was found to have markedly elevated liver function tests with an AST of 604 ALT of 142 alkaline phosphatase of 227 and a bilirubin of 23.4.  He had icterus abdominal pain and nausea and vomiting.  He developed acute kidney injury likely due to dehydration while in the hospital.  My partner  Dr. Luciana Axe changed his antiretrovirals from [facility] and [name] to [facility] and Justin Ball  It was unclear to our team or hepatology what was causing his elevation of his transaminases and bilirubin.  Consideration was given to reactivation of hepatitis B if he had been off of his antiretrovirals but his hepatitis B was actually well controlled.  We still not have his viral load back.  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  His hepatitis C was certainly active as well.  Ultimately his liver function test stabilized and his creatinine improved.  He was discharged to home.  Of note he also developed a pruritic rash on his back and arms.  Since discharge in the hospital he has been vomiting multiple times per day as he does not have any anti-nausea medicines  In talking to him and his grandmother today the grandmother disclosed to me that he has been purchasing and by being large amounts of alcohol over the week to 2 weeks prior to his admission.  No doubt that he  was drinking up to a bottle of wine per day and up to 3 large cans or bottles of malt liquor when he was not drinking wine  Back then that his acute liver injury was due to to alcohol ingestion superimposed upon chronic hepatitis C B and HIV.  Was very concerned that he will not be able to be safely managed at home.  Apparently he did drink further after discharge from the hospital though not in the quantities that he was drinking before.  I have told him that he cannot drink  at all right now I am concerned though that his grandmother will not be able to prevent him from drinking.  I proposed admission but mother wanted to give him a chance at home first.  Therefore we will see him back in 1 week's time provided his liver function test and labs in today or not becoming dramatically worse.  Drawing lab work on him in January which then came back with even worse liver function tests we had him  admitted to the hospital to the family medicine service with consideration of placing him in a long-term care facility was considered not competent to take care of himself.  Ultimately however decision was made for him to go back home with his grandmother.  According to the patient and his grandmother he is not been drinking alcohol since then.  His viral load is under control and CD4 at 200.  LFTS still abnormal when last checked.  He and his grandmother claim he is not ingesting alcohol  They are "sheltering in place" Justin Ball is visited by his sister in her 63s who does have two small children but they are not in contact directly with children.  I have asked that sister and children also be sure to shelter in place.  Past Medical History:  Diagnosis Date  . 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 Alcoholic hepatitis 01/14/2019  . Anxiety   . 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 Bipolar 1 disorder (HCC)   . 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 Cognitive communication deficit    working w/ therapy  . 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 Depression   . 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 Fecal incontinence 11/19/2017  . Foley catheter in place   . 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 History of scabies 2013  . History of syphilis   . 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 Nausea and vomiting 01/14/2019  . 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 Neurogenic bowel    wears depends  . Neuromuscular disorder (HCC)   . 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 Seizure disorder [facility])    neurologist-  dr Marjory Lies ([facility])--caused by cryptococcal meningitis/ multiple infarctions--  no seizure since hospitalization May 2018  . Status epilepticus (HCC)   . URI (upper respiratory infection) 02/24/2019  . Visuospatial deficit     Past Surgical History:  Procedure Laterality Date  . CYSTOSCOPY N/A 10/10/2017   Procedure: CYSTOSCOPY;  Surgeon: Sebastian Ache, MD;  Location: [facility];  Service: Urology;  Laterality: N/A;  . EXAMINATION UNDER ANESTHESIA N/A 12/28/2014   Procedure: EXAM UNDER ANESTHESIA;  Surgeon: Karie Soda, MD;  Location: [facility];  Service: General;  Laterality: N/A;  . FLOOR OF MOUTH BIOPSY N/A 09/16/2016   Procedure: BIOPSY OF ORAL ABCESS;  Surgeon: Newman Pies, MD;  Location: [facility] OR;  Service: ENT;  Laterality: N/A;  . INCISION AND DRAINAGE ABSCESS N/A 09/16/2016   Procedure:  INCISION AND DRAINAGE ABSCESS;  Surgeon: Newman Pies, MD;  [facility];  Service: ENT;  Laterality: N/A;  . INCISION AND DRAINAGE PERIRECTAL ABSCESS  09/ 03/ 2011   dr toth  . INCISION AND DRAINAGE PERIRECTAL ABSCESS N/A 12/28/2014   Procedure: IRRIGATION AND DEBRIDEMENT PERIRECTAL ABSCESS;  Surgeon: Karie Soda, MD;  Location: [facility];  Service: General;  Laterality: N/A;  Fistula repair and ablation  . INSERTION OF SUPRAPUBIC CATHETER N/A 10/10/2017   Procedure: INSERTION OF SUPRAPUBIC CATHETER;  Surgeon: Sebastian Ache, MD;  Location: [facility];  Service: Urology;  Laterality: N/A;  . LAPAROSCOPIC REVISION VENTRICULAR-PERITONEAL (V-P) SHUNT N/A 06/12/2017   Procedure: LAPAROSCOPIC REVISION VENTRICULAR-PERITONEAL (V-P) SHUNT;  Surgeon: Kinsinger, De Blanch, MD;  Location: [facility];  Service: General;  Laterality: N/A;  . SHUNT REMOVAL N/A 01/01/2018   Procedure: REMOVAL OF V-P SHUNT, PLACEMENT OF EVD;   Surgeon: [name], MD;  Location: [facility];  Service: Neurosurgery;  Laterality: N/A;  . TRANSTHORACIC ECHOCARDIOGRAM  06/02/2017   EF 65-70%/  trivial PR  . VENTRICULOPERITONEAL SHUNT N/A 06/12/2017   Procedure: SHUNT INSERTION VENTRICULAR-PERITONEAL;  Surgeon: [name], MD;  Location: [facility];  Service: Neurosurgery;  Laterality: N/A;    Family History  Problem Relation Age of Onset  . Hypertension Other       Social History   Socioeconomic History  . Marital status: Single    Spouse name: Not on file  . Number of children: Not on file  . Years of education: Not on file  . Highest education level: Not on file  Occupational History  . Not on file  Social Needs  . Financial resource strain: Not on file  . Food insecurity:    Worry: Not on file    Inability: Not on file  . Transportation needs:    Medical: Not on file    Non-medical: Not on file  Tobacco Use  . Smoking status: Current Every Day Smoker    Packs/day: 0.50    Years: 5.00    Pack years: 2.50    Types: Cigarettes  . Smokeless tobacco: Never Used  . Tobacco comment: cutting back  Substance and Sexual Activity  . Alcohol use: Yes  . Drug use: No  . Sexual activity: Yes    Partners: Male    Birth control/protection: Condom    Comment: irregular condom use; educated  Lifestyle  . Physical activity:    Days per week: Not on file    Minutes per session: Not on file  . Stress: Not on file  Relationships  . Social connections:    Talks on phone: Not on file    Gets together: Not on file    Attends religious service: Not on file    Active member of club or organization: Not on file    Attends meetings of clubs or organizations: Not on file    Relationship status: Not on file  Other Topics Concern  . Not on file  Social History Narrative  . Not on file    Allergies  Allergen Reactions  . Acyclovir And Related Itching     Current Outpatient Medications:  .  acetaminophen (TYLENOL)  325 MG tablet, Take 2 tablets (650 mg total) by mouth every 4 (four) hours as needed for mild pain (temp > 101.5)., Disp: , Rfl:  .  Darunavir-Cobicisctat-Emtricitabine-Tenofovir Alafenamide (SYMTUZA) 800-150-200-10 MG TABS, Take 1 tablet by mouth daily with breakfast., Disp: 30 tablet, Rfl: 0 .  dolutegravir (TIVICAY)  50 MG tablet, Take 1 tablet (50 mg total) by mouth daily., Disp: 30 tablet, Rfl: 0 .  fluconazole (DIFLUCAN) 200 MG tablet, Take 2 tablets (400 mg total) by mouth daily at 6 PM. (Patient not taking: Reported on 01/15/2019), Disp: 112 tablet, Rfl: 0 .  levETIRAcetam (KEPPRA) 1000 MG tablet, Take 1 tablet (1,000 mg total) by mouth 2 (two) times daily., Disp: 60 tablet, Rfl: 5 .  ondansetron (ZOFRAN) 4 MG tablet, Take 1 tablet (4 mg total) by mouth every 6 (six) hours as needed for nausea or vomiting., Disp: 120 tablet, Rfl: 2 .  QUEtiapine (SEROQUEL) 25 MG tablet, Take 1 tablet (25 mg total) by mouth 2 (two) times daily as needed (agitation)., Disp: 60 tablet, Rfl: 4 .  sulfamethoxazole-trimethoprim (BACTRIM DS,SEPTRA DS) 800-160 MG tablet, Take 1 tablet by mouth daily. (Patient not taking: Reported on 01/15/2019), Disp: 30 tablet, Rfl: 0 .  valACYclovir (VALTREX) 1000 MG tablet, Take 1 tablet (1,000 mg total) by mouth 3 (three) times daily. (Patient taking differently: Take 1,000 mg by mouth every Monday, Wednesday, and Friday. ), Disp: 30 tablet, Rfl: 0     Observations/Objective:  HIV/AIDS: well controlled RTC in August  Cryptococcal meningitis": continue fluconazole  Alcoholic hepatitis:  Chronic Hep C  Chronic Hep B  Assessment and Plan:  HIV: continue regimen, rtc in August   Crypto: continue azole  Etoh hepatitis: AVOID all etoh  HCV: not good idea to try to start rx now reassess with elastography in future  Follow Up Instructions:    I discussed the assessment and treatment plan with the patient. The patient was provided an opportunity to ask questions and all  were answered. The patient agreed with the plan and demonstrated an understanding of the instructions.   The patient was advised to call back or seek an in-person evaluation if the symptoms worsen or if the condition fails to improve as anticipated.  I provided 30 minutes of non-face-to-face time during this encounter.   Acey Lav, MD

## 2019-03-26 MED FILL — levETIRAcetam 1000 MG TABS: 1000 | 30 days supply | Qty: 60 | Fill #0

## 2019-03-26 MED FILL — TIVICAY 50 MG TABLET: 50 | 30 days supply | Qty: 30 | Fill #0

## 2019-03-26 MED FILL — ONDANSETRON HCL 4 MG TABLET: 4 | 30 days supply | Qty: 120 | Fill #0

## 2019-03-26 MED FILL — SYMTUZA 800-150-200-10 MG T: 800-150-200 | 30 days supply | Qty: 30 | Fill #0

## 2019-03-26 MED FILL — SULFAMETHOXAZOLE-TMP DS TAB: 800-160 | 30 days supply | Qty: 30 | Fill #0

## 2019-03-26 MED FILL — QUETIAPINE FUMARATE 25 MG T: 25 | 30 days supply | Qty: 60 | Fill #0

## 2019-03-26 MED FILL — valACYclovir HCL 1 GM TABS: 1 | 10 days supply | Qty: 30 | Fill #0

## 2019-03-26 MED FILL — FLUCONAZOLE 200 MG TAB: 200 | 30 days supply | Qty: 30 | Fill #0

## 2019-04-26 IMAGING — CT CT HEAD WO/W CM
3 of 8 series · 17 of 47 positions shown, 19 images · IV contrast (agent unspecified)
Comparison: Noncontrast head CT 05/18/2017

CLINICAL DATA: New onset seizure.  History of HIV.

EXAM:
CT HEAD WITHOUT AND WITH CONTRAST
TECHNIQUE: Contiguous axial images were obtained from the base of the skull
through the vertex without and with intravenous contrast
CONTRAST:  75 mL [id number]

[Series 3: head without without · axial · non-contrast · 0.42mm/px · z∈[-55,+70]mm · 8 of 33 slices shown, 10 images]
[im 4/33  brain]
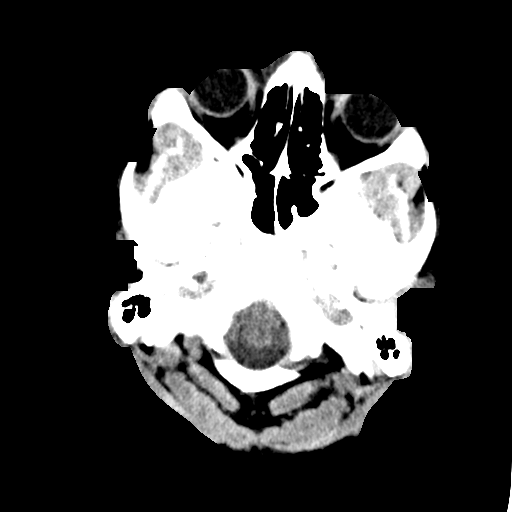
[im 4/33  bone]
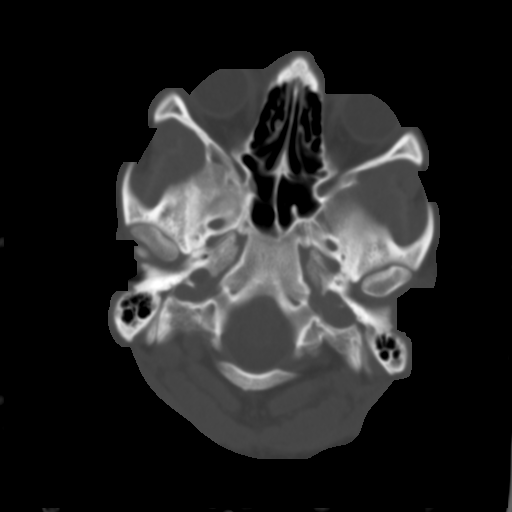
[im 8/33  brain]
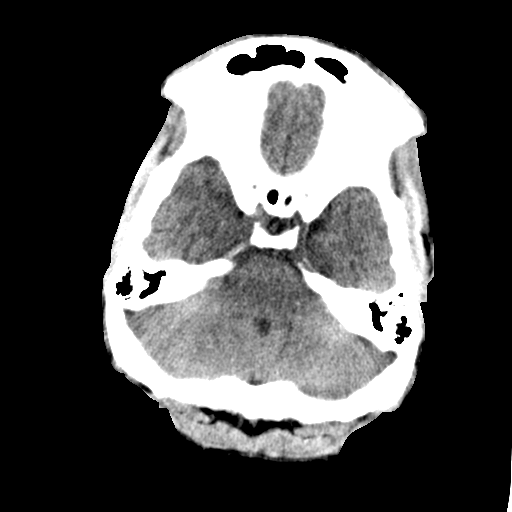
[im 11/33  brain]
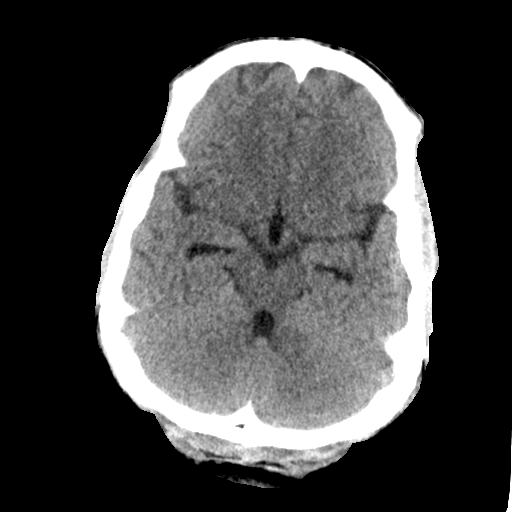
[im 15/33  brain]
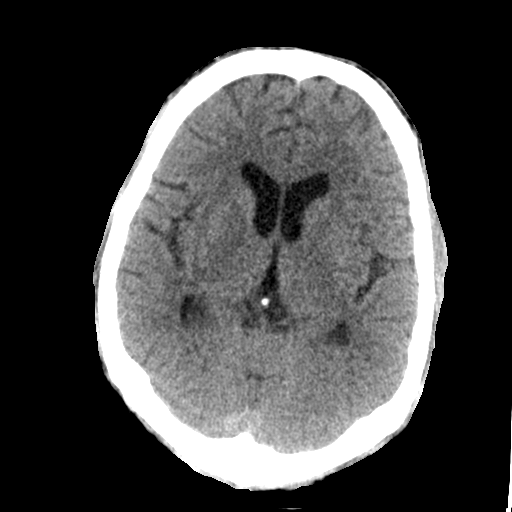
[im 18/33  brain]
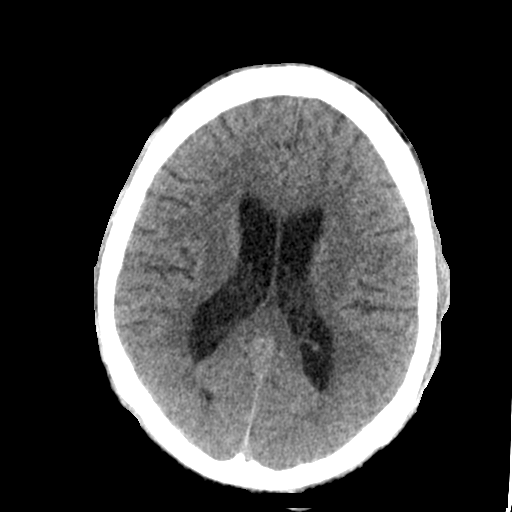
[im 18/33  bone]
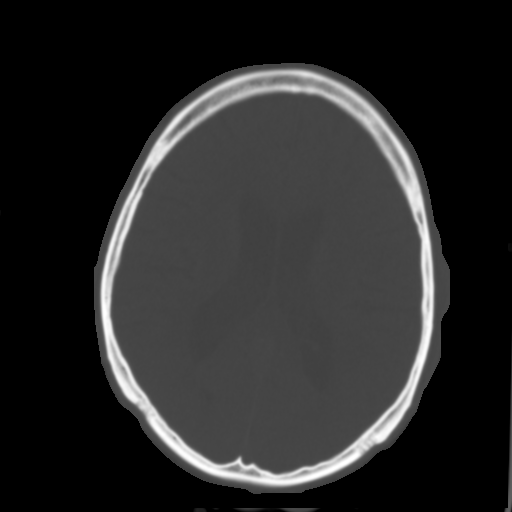
[im 22/33  brain]
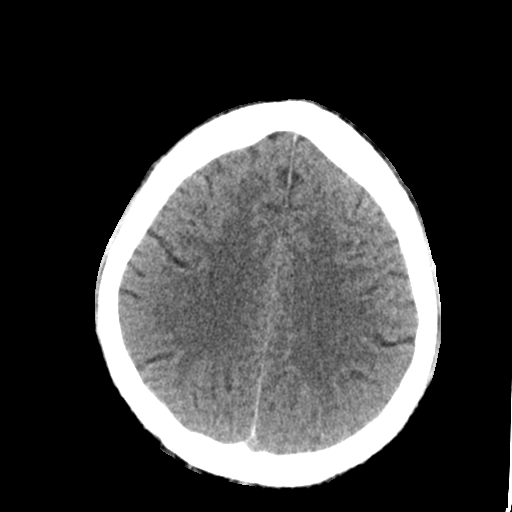
[im 25/33  brain]
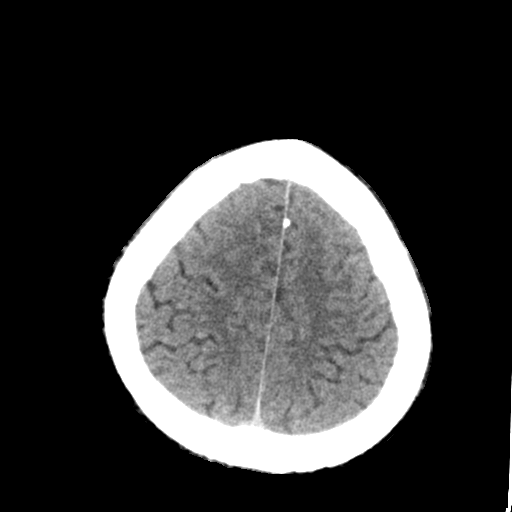
[im 29/33  brain]
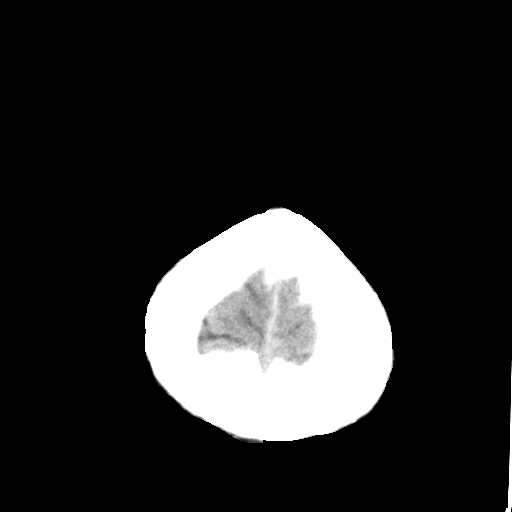

[Series 5: head with · axial · 0.42mm/px · z∈[-55,+35]mm · 6 of 33 slices shown]
[im 4/33  brain]
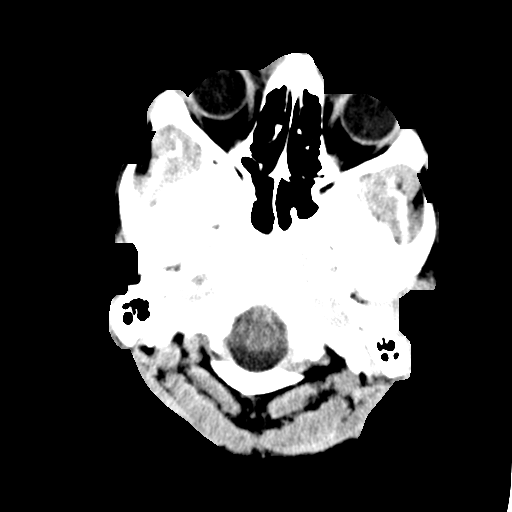
[im 8/33  brain]
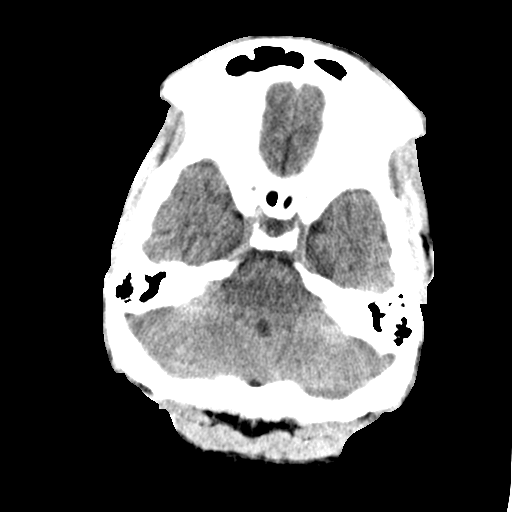
[im 11/33  brain]
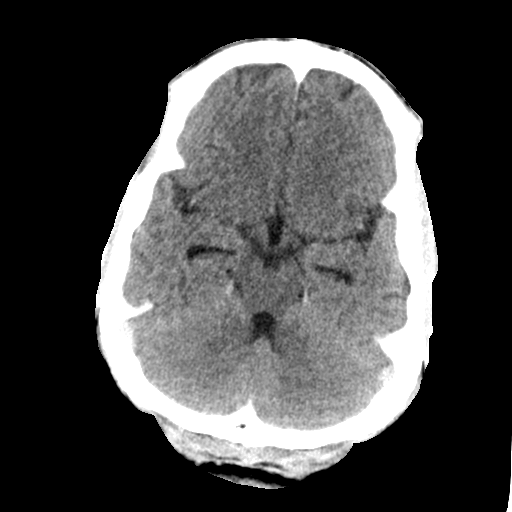
[im 15/33  brain]
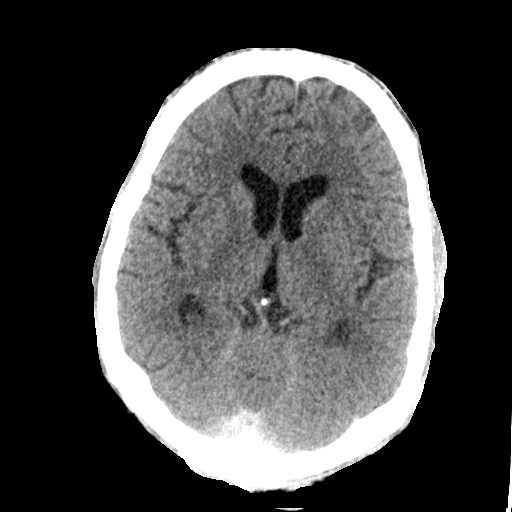
[im 18/33  brain]
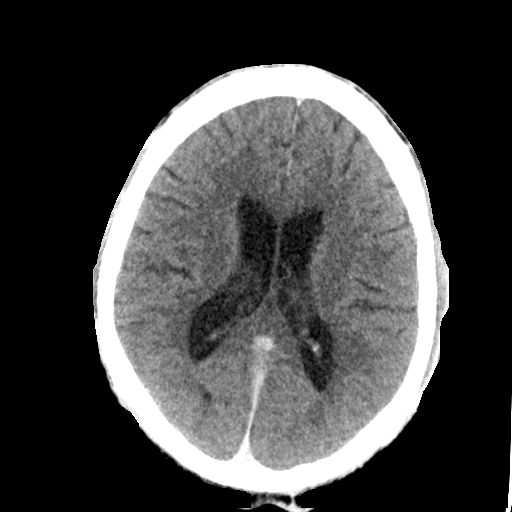
[im 22/33  brain]
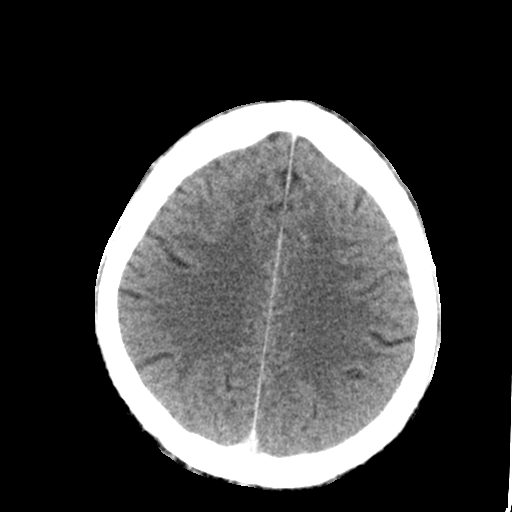

[Series 6: head with cor · coronal · 0.32mm/px · 3 of 67 slices shown]
[im 23/67  brain]
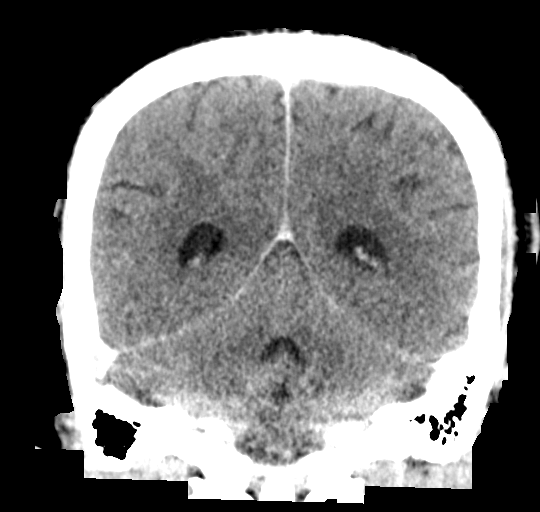
[im 30/67  brain]
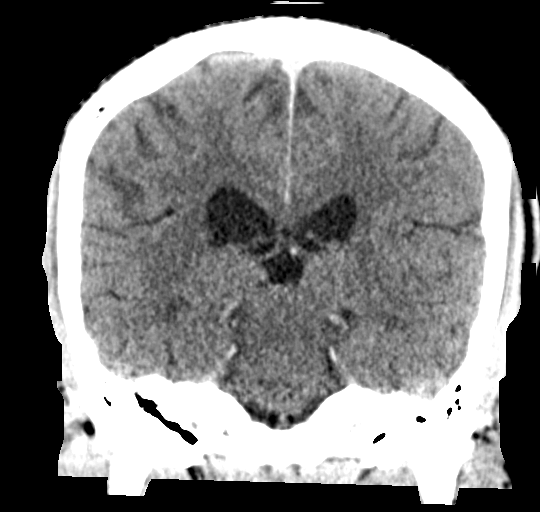
[im 37/67  brain]
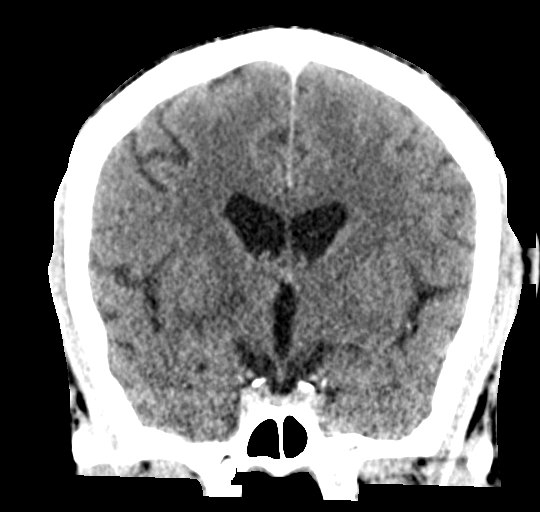

[17 of 47 positions shown; findings below may reference images not displayed]

FINDINGS: Brain: There is no evidence of acute infarct, intracranial
hemorrhage, mass, midline shift, or extra-axial fluid collection.
Minimal prominence of the lateral ventricles for age is unchanged. A
small amount of subtle hypoattenuation in the right internal capsule
is unchanged and nonspecific. No abnormal enhancement is identified.

Vascular: Visible vessels are patent.

Skull: No fracture or focal osseous lesion.

Sinuses/Orbits: No significant inflammatory disease in the imaged
portions of the paranasal sinuses and mastoid air cells.
Unremarkable orbits.

Other: None.
IMPRESSION: 1. No acute intracranial abnormality identified.
2. Minimal unchanged hypoattenuation in the right internal capsule,
nonspecific.

## 2019-05-02 IMAGING — CR DG CHEST 1V PORT
1 series · 1 of 1 positions shown · non-contrast
Comparison: 05/25/2017 and prior radiograph

CLINICAL DATA: Acute respiratory failure.

EXAM:
PORTABLE CHEST 1 VIEW

[AP]
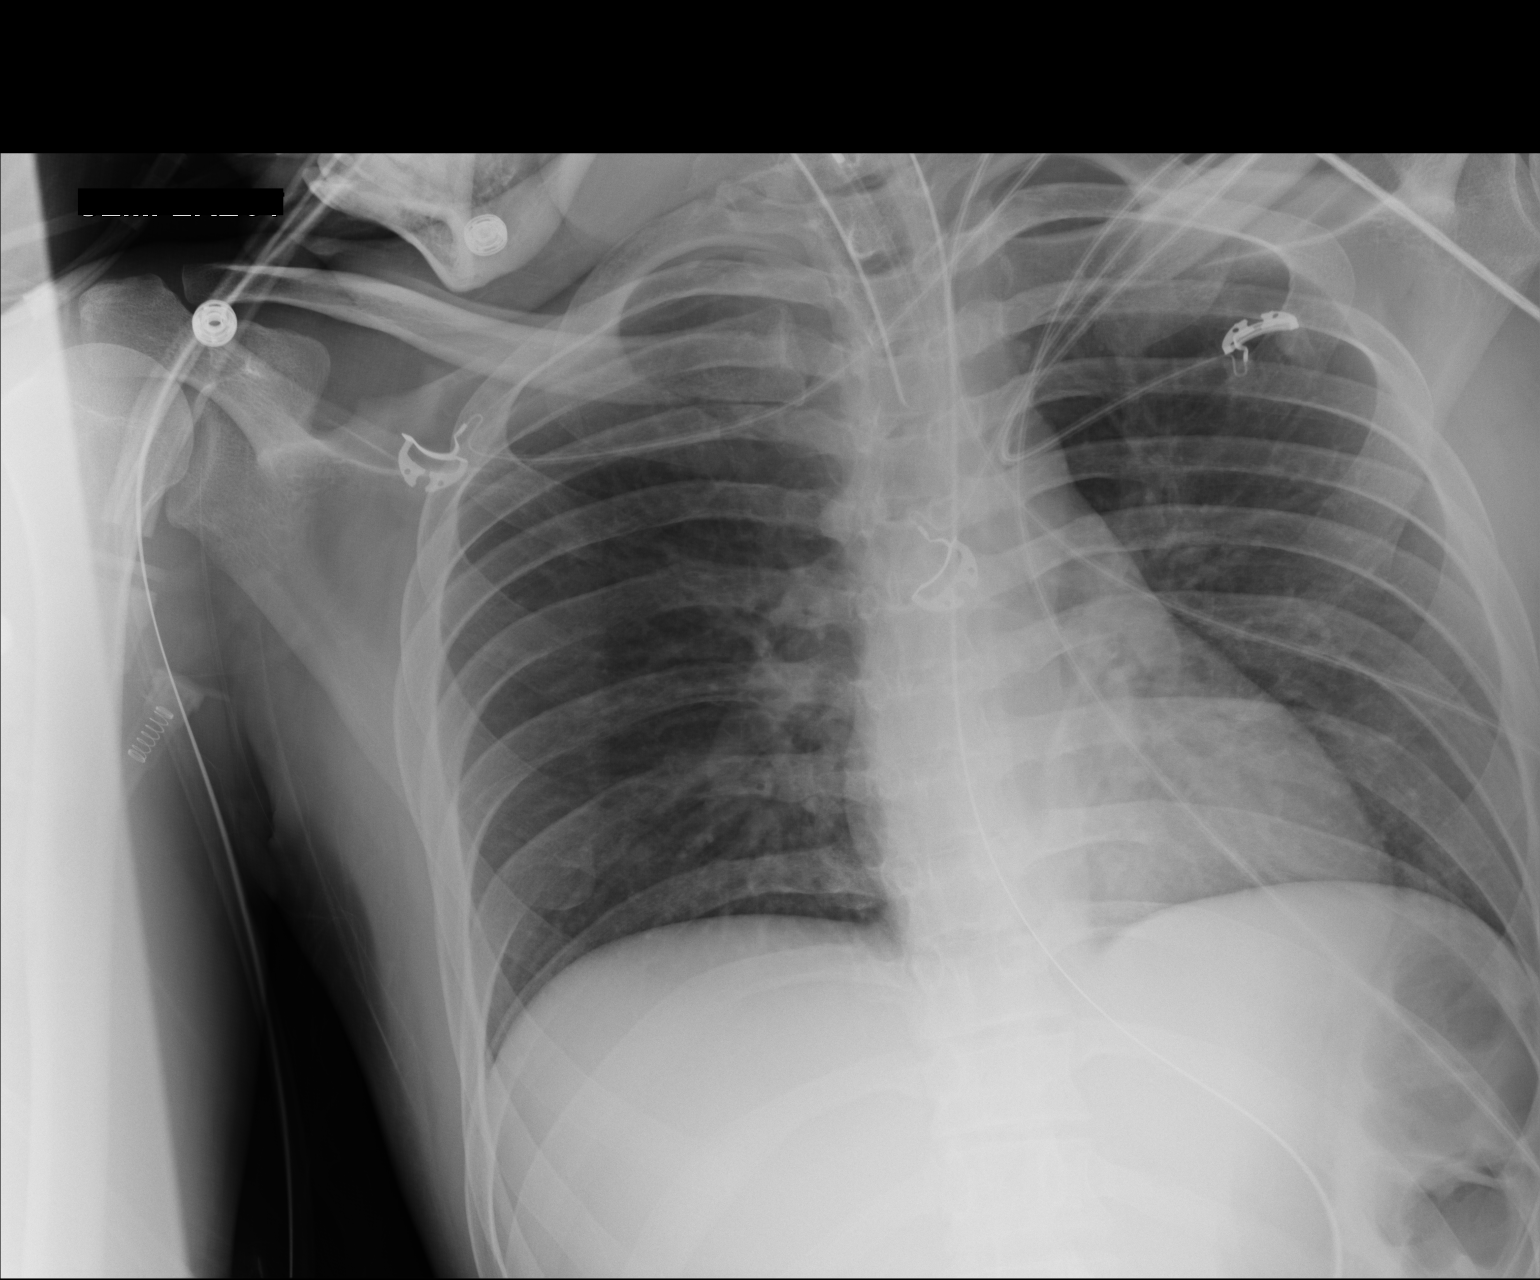

[1 of 1 positions shown; findings below may reference images not displayed]

FINDINGS: The cardiomediastinal silhouette is unremarkable.

An endotracheal tube with tip 3.1 cm above the carina and NG tube
entering the stomach with tip off the field of view again noted.

There is no evidence of focal airspace disease, pulmonary edema,
suspicious pulmonary nodule/mass, pleural effusion, or pneumothorax.

No acute bony abnormalities are identified.
IMPRESSION: Support apparatus as described.  No significant change.

## 2019-05-03 IMAGING — CR DG CHEST 1V PORT
1 series · 1 of 1 positions shown · non-contrast
Comparison: 05/26/2017

CLINICAL DATA: Assess endotracheal tube and nasogastric tube.

EXAM:
PORTABLE CHEST 1 VIEW

[AP]
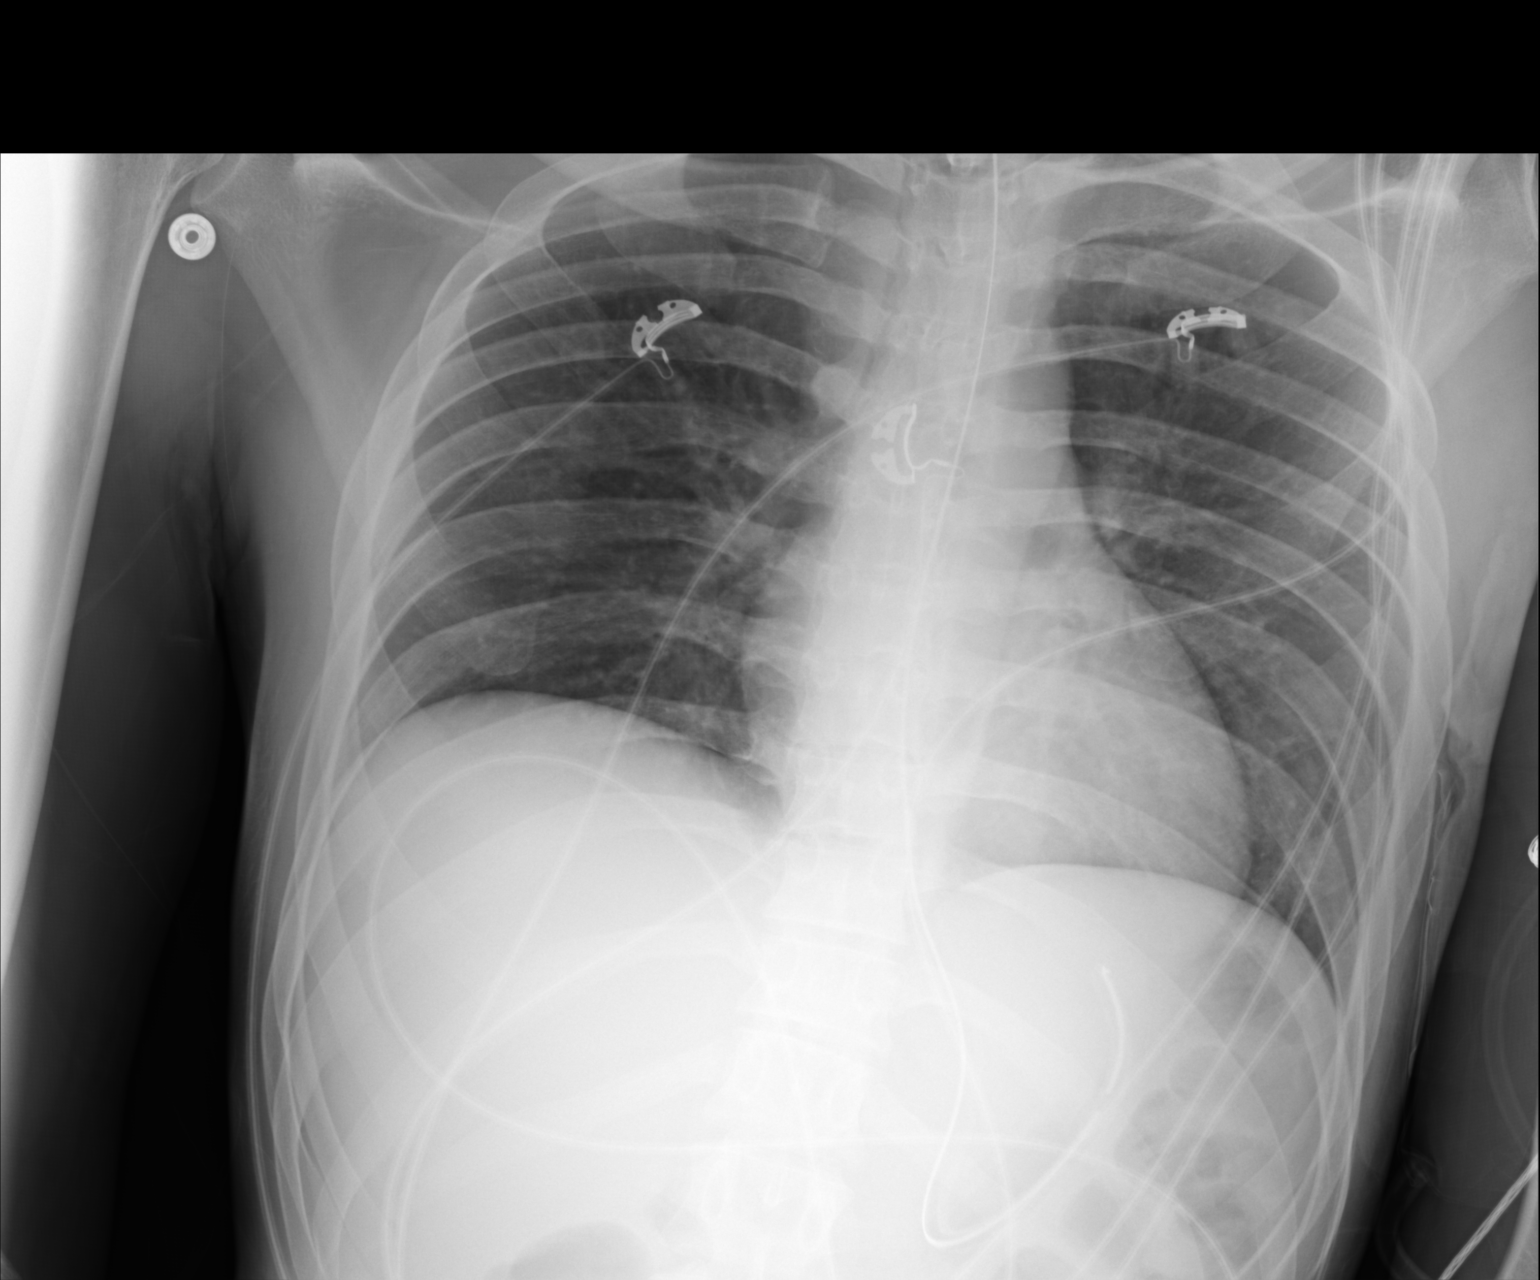

[1 of 1 positions shown; findings below may reference images not displayed]

FINDINGS: Endotracheal tube is been removed. Nasogastric tube enters stomach
with the tip in the fundus. Heart mediastinal shadows are normal.
The lungs are clear.
IMPRESSION: No active disease.

## 2019-05-15 ENCOUNTER — Other Ambulatory Visit: Payer: Self-pay

## 2019-05-15 ENCOUNTER — Ambulatory Visit (HOSPITAL_COMMUNITY)
Admission: EM | Admit: 2019-05-15 | Discharge: 2019-05-15 | Disposition: A | Discharge status: Home / Self Care | Payer: Medicaid Other | Attending: Family Medicine | Admitting: Family Medicine

## 2019-05-15 ENCOUNTER — Encounter (HOSPITAL_COMMUNITY): Payer: Self-pay | Admitting: Emergency Medicine

## 2019-05-15 DIAGNOSIS — S0031XA Abrasion of nose, initial encounter: Secondary | ICD-10-CM | POA: Diagnosis not present

## 2019-05-15 LAB — CBC
HCT: 31 % — ABNORMAL LOW (ref 39.0–52.0)
Hemoglobin: 11.3 g/dL — ABNORMAL LOW (ref 13.0–17.0)
MCH: 32.6 pg (ref 26.0–34.0)
MCHC: 36.5 g/dL — ABNORMAL HIGH (ref 30.0–36.0)
MCV: 89.3 fL (ref 80.0–100.0)
Platelets: 92 10*3/uL — ABNORMAL LOW (ref 150–400)
RBC: 3.47 MIL/uL — ABNORMAL LOW (ref 4.22–5.81)
RDW: 18 % — ABNORMAL HIGH (ref 11.5–15.5)
WBC: 7.4 10*3/uL (ref 4.0–10.5)
nRBC: 0 % (ref 0.0–0.2)

## 2019-05-15 MED ORDER — LIDOCAINE-EPINEPHRINE-TETRACAINE (LET) SOLUTION
NASAL | Status: AC
  Filled 2019-05-15: qty 3

## 2019-05-15 NOTE — Discharge Instructions (Addendum)
The area clean and dry. Keep nails short to prevent scratching face and causing it incidental cuts and abrasions. Return if area continues to bleed, becomes more painful, show signs of infection (pus, fever). We will call with results of CBC, which was done due to your chronic medical conditions and history of anemia.

## 2019-05-15 NOTE — ED Provider Notes (Signed)
[facility]    CSN: [id number] Arrival date & time: 05/15/19  5784     History   Chief Complaint Chief Complaint  Patient presents with  . Abrasion    HPI DASHIEL BERGQUIST is a 28 y.o. male with past medical history significant for AIDS, hepatitis C, anemia, presenting for acute concern of nasal abrasion.  Patient states that he thinks he scratched his nose in the middle the night and woke up  his nose bleeding.  Patient states he has able to achieve homeostasis since this morning.  Patient states that he used 2 towels ".  Patient denies dizziness, lightheadedness, palpitations, syncopal event.  Patient does endorse fatigue.  Last CBC was done 02/24/2019: Hemoglobin was 11.8, hematocrit 37.2, RBC 3.71 which was overall consistent.  Patient is followed by both infectious disease and primary care on a routine basis and reports compliance with his antivirals.    Past Medical History:  Diagnosis Date  . 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 Alcoholic hepatitis 01/14/2019  . Anxiety   . 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 Bipolar 1 disorder (HCC)   . 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 Cognitive communication deficit    working w/ therapy  . 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 Depression   . 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 Fecal incontinence 11/19/2017  . Foley catheter in place   . 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 History of scabies 2013  . History of  syphilis   . 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 Nausea and vomiting 01/14/2019  . 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 Neurogenic bowel    wears depends  . Neuromuscular disorder (HCC)   . 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 Seizure disorder [facility])    neurologist-  dr Marjory Lies ([facility])--caused by cryptococcal meningitis/ multiple infarctions--  no seizure since hospitalization May 2018  . Status epilepticus (HCC)   . URI (upper respiratory infection) 02/24/2019  . Visuospatial deficit     Patient Active Problem List   Diagnosis Date Noted  . URI (upper respiratory infection) 02/24/2019  . Liver failure (HCC) 01/15/2019  . Alcoholic hepatitis 01/14/2019  . Nausea and vomiting 01/14/2019  . RUQ pain   . Anxiety   . Jaundice 01/08/2019  . Perianal ulcer (HCC) 11/14/2018  . Hepatitis C virus infection without hepatic coma   . Acute renal failure (HCC)   . Non compliance w medication regimen   . Prolonged Q-T interval on ECG   . Intraabdominal fluid collection   . Seizure disorder (HCC) 09/12/2017  . 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 History of syphilis   . Tobacco abuse   . Cryptococcal meningoencephalitis (HCC)   . History of cryptococcal meningitis   . Worsening headaches 05/20/2017  . 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 Hypokalemia 09/12/2016  . Anemia 09/12/2016  .  AIDS (acquired immune deficiency syndrome) (HCC) 01/19/2015  . 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 Bipolar disorder (HCC) 04/08/2013    Past Surgical History:  Procedure Laterality Date  . CYSTOSCOPY N/A 10/10/2017   Procedure: CYSTOSCOPY;  Surgeon: Sebastian Ache, MD;  Location: [facility];  Service: Urology;  Laterality: N/A;  . EXAMINATION UNDER ANESTHESIA N/A 12/28/2014   Procedure: EXAM UNDER ANESTHESIA;  Surgeon: Karie Soda, MD;  Location: [facility];  Service: General;  Laterality: N/A;  . FLOOR OF MOUTH BIOPSY N/A 09/16/2016   Procedure: BIOPSY OF ORAL ABCESS;  Surgeon: Newman Pies, MD;  Location: [facility];  Service: ENT;  Laterality: N/A;  . INCISION AND DRAINAGE ABSCESS N/A 09/16/2016   Procedure: INCISION AND DRAINAGE ABSCESS;  Surgeon: Newman Pies, MD;  Location: [facility];  Service: ENT;  Laterality: N/A;  . INCISION AND DRAINAGE PERIRECTAL ABSCESS  09/ 03/ 2011   dr toth  . INCISION AND DRAINAGE PERIRECTAL ABSCESS N/A 12/28/2014   Procedure: IRRIGATION AND DEBRIDEMENT PERIRECTAL ABSCESS;  Surgeon: Karie Soda, MD;  Location: [facility];  Service: General;  Laterality: N/A;  Fistula repair and ablation  . INSERTION OF SUPRAPUBIC CATHETER N/A 10/10/2017   Procedure: INSERTION OF SUPRAPUBIC CATHETER;  Surgeon: Sebastian Ache, MD;  Location: [facility];  Service: Urology;  Laterality: N/A;  . LAPAROSCOPIC REVISION VENTRICULAR-PERITONEAL (V-P) SHUNT N/A 06/12/2017   Procedure: LAPAROSCOPIC REVISION VENTRICULAR-PERITONEAL (V-P) SHUNT;  Surgeon: Kinsinger, De Blanch, MD;  Location: [facility];  Service: General;  Laterality: N/A;  . SHUNT REMOVAL N/A 01/01/2018   Procedure: REMOVAL OF V-P SHUNT, PLACEMENT OF EVD;  Surgeon: [name], MD;  Location: [facility];  Service: Neurosurgery;  Laterality: N/A;  . TRANSTHORACIC ECHOCARDIOGRAM  06/02/2017   EF 65-70%/  trivial PR  . VENTRICULOPERITONEAL SHUNT N/A 06/12/2017   Procedure: SHUNT INSERTION VENTRICULAR-PERITONEAL;  Surgeon: [name], MD;  Location: [facility];  Service: Neurosurgery;  Laterality: N/A;       Home Medications    Prior to Admission medications   Medication Sig Start Date End Date Taking? Authorizing Provider  Darunavir-Cobicisctat-Emtricitabine-Tenofovir Alafenamide [facility]) 800-150-200-10 MG TABS Take 1 tablet by mouth daily with breakfast. 03/25/19  Yes [name], Lisette Grinder, MD  dolutegravir (TIVICAY) 50 MG tablet Take 1 tablet (50 mg total) by mouth daily. 03/25/19   Yes [name], Lisette Grinder, MD  levETIRAcetam (KEPPRA) 1000 MG tablet Take 1 tablet (1,000 mg total) by mouth 2 (two) times daily. 03/25/19  Yes [name], Lisette Grinder, MD  acetaminophen (TYLENOL) 325 MG tablet Take 2 tablets (650 mg total) by mouth every 4 (four) hours as needed for mild pain (temp > 101.5). 06/28/17   Cliffton Asters, MD  fluconazole (DIFLUCAN) 200 MG tablet Take 1 tablet (200 mg total) by mouth daily. 03/25/19   Randall Hiss, MD  ondansetron [facility]) 4 MG tablet Take 1 tablet (4 mg total) by mouth every 6 (six) hours as needed for nausea or vomiting. 03/25/19   [name], Lisette Grinder, MD  QUEtiapine (SEROQUEL) 25 MG tablet Take 1 tablet (25 mg total) by mouth 2 (two) times daily as needed (agitation). Patient not taking: Reported on 05/15/2019 03/25/19   [name], Lisette Grinder, MD  sulfamethoxazole-trimethoprim (BACTRIM DS,SEPTRA DS) 800-160 MG tablet Take 1 tablet by mouth daily. 03/25/19   Randall Hiss, MD  valACYclovir (VALTREX) 1000 MG tablet Take 1 tablet (1,000 mg total) by mouth 3 (three) times daily. Patient taking differently: Take  1,000 mg by mouth every Monday, Wednesday, and Friday.  11/14/18   Cliffton Astersampbell, John, MD    Family History Family History  Problem Relation Age of Onset  . Hypertension Other     Social History Social History   Tobacco Use  . Smoking status: Current Every Day Smoker    Packs/day: 0.50    Years: 5.00    Pack years: 2.50    Types: Cigarettes  . Smokeless tobacco: Never Used  . Tobacco comment: cutting back  Substance Use Topics  . Alcohol use: Yes  . Drug use: No     Allergies   Acyclovir and related   Review of Systems Review of Systems  Constitutional: Positive for fatigue. Negative for fever.  Respiratory: Negative for chest tightness and shortness of breath.   Cardiovascular: Negative for chest pain and palpitations.  Gastrointestinal: Negative for abdominal distention and abdominal pain.  Neurological: Negative for  dizziness, syncope, weakness and light-headedness.  Hematological: Bruises/bleeds easily.     Physical Exam Triage Vital Signs ED Triage Vitals  Enc Vitals Group     BP 05/15/19 1854 117/78     Pulse Rate 05/15/19 1854 94     Resp 05/15/19 1854 20     Temp 05/15/19 1854 98.6 F (37 C)     Temp Source 05/15/19 1854 Oral     SpO2 05/15/19 1854 97 %     Weight --      Height --      Head Circumference --      Peak Flow --      Pain Score 05/15/19 1851 0     Pain Loc --      Pain Edu? --      Excl. in GC? --    No data found.  Updated Vital Signs BP 117/78 (BP Location: Left Arm)   Pulse 94   Temp 98.6 F (37 C) (Oral)   Resp 20   SpO2 97%   Visual Acuity Right Eye Distance:   Left Eye Distance:   Bilateral Distance:    Right Eye Near:   Left Eye Near:    Bilateral Near:     Physical Exam Constitutional:      General: He is not in acute distress.    Appearance: He is normal weight.  HENT:     Head: Normocephalic and atraumatic.     Mouth/Throat:     Mouth: Mucous membranes are moist.     Pharynx: Oropharynx is clear.  Eyes:     General: Scleral icterus present.     Conjunctiva/sclera: Conjunctivae normal.     Pupils: Pupils are equal, round, and reactive to light.  Abdominal:     General: Abdomen is flat.     Palpations: Abdomen is soft.     Tenderness: There is no guarding.     Comments: Mild hepatomegaly, negative for splenomegaly.  Skin:    General: Skin is warm.     Coloration: Skin is not pale.     Comments: Small, superficial abrasion on front of nose that is actively bleeding, nontender, no underlying erythema or purulent discharge.  Neurological:     Mental Status: He is alert and oriented to person, place, and time.     Comments: Positive for cognitive impairment      UC Treatments / Results  Labs (all labs ordered are listed, but only abnormal results are displayed) Labs Reviewed  CBC    EKG None  Radiology No results found.  Procedures Procedures (including critical care time)  Medications Ordered in UC Medications - No data to display  Initial Impression / Assessment and Plan / UC Course  I have reviewed the triage vital signs and the nursing notes.  Pertinent labs & imaging results that were available during my care of the patient were reviewed by me and considered in my medical decision making (see chart for details).     28 y.o. male w/ AIDs, hepatitis, anemia presenting with nasal abrasion.  Hemostasis achieved with direct pressure, lidocaine w/ epinephrine, and single silver nitrate swab.  CBC done for Hgb due to pt's history and fatigue in setting of blood loss.  Pt hemodynamically stable at discharge. Final Clinical Impressions(s) / UC Diagnoses   Final diagnoses:  Nose abrasion, non-infected     Discharge Instructions     The area clean and dry. Keep nails short to prevent scratching face and causing it incidental cuts and abrasions. Return if area continues to bleed, becomes more painful, show signs of infection (pus, fever). We will call with results of CBC, which was done due to your chronic medical conditions and history of anemia.   ED Prescriptions    None     Controlled Substance Prescriptions [place] Controlled Substance Registry consulted? Not Applicable   Shea Evans, Cordelia Poche 05/15/19 2029

## 2019-05-15 NOTE — ED Triage Notes (Signed)
Patient reports he thinks he scratched his nose while asleep today.  There is a wound to the nose, scratch.  Wound is bleeding, currenlty pressure is being held

## 2019-05-17 ENCOUNTER — Telehealth (HOSPITAL_COMMUNITY): Payer: Self-pay | Admitting: Emergency Medicine

## 2019-05-17 NOTE — Telephone Encounter (Signed)
Labs are stable from previous visit per [place] PA, attempted to reach patient, grandmother stated he would call back.

## 2019-05-22 ENCOUNTER — Telehealth: Payer: Self-pay | Admitting: Infectious Disease

## 2019-05-22 NOTE — Telephone Encounter (Signed)
COVID-19 Pre-Screening Questions: 05/22/19 ° °Do you currently have a fever (>100 °F), chills or unexplained body aches? NO ° °Are you currently experiencing new cough, shortness of breath, sore throat, runny nose? NO  °•  °Have you recently travelled outside the state of [place] in the last 14 days? NO °•  °Have you been in contact with someone that is currently pending confirmation of Covid19 testing or has been confirmed to have the Covid19 virus? NO °**If the patient answers NO to ALL questions -  advise the patient to please call the clinic before coming to the office should any symptoms develop.  ° ° ° °

## 2019-05-26 ENCOUNTER — Ambulatory Visit: Payer: Medicaid Other | Admitting: Infectious Disease

## 2019-06-02 ENCOUNTER — Ambulatory Visit (INDEPENDENT_AMBULATORY_CARE_PROVIDER_SITE_OTHER): Payer: Medicaid Other | Admitting: Infectious Disease

## 2019-06-02 ENCOUNTER — Encounter: Payer: Self-pay | Admitting: Infectious Disease

## 2019-06-02 ENCOUNTER — Other Ambulatory Visit: Payer: Self-pay

## 2019-06-02 ENCOUNTER — Inpatient Hospital Stay (HOSPITAL_COMMUNITY)
Admission: AD | Admit: 2019-06-02 | Discharge: 2019-06-08 | DRG: 433 | Disposition: A | Discharge status: Home / Self Care | Payer: Medicaid Other | Source: Ambulatory Visit | Attending: Internal Medicine | Admitting: Internal Medicine

## 2019-06-02 VITALS — BP 116/68 | HR 108 | Temp 98.6°F | Ht 73.0 in

## 2019-06-02 DIAGNOSIS — F319 Bipolar disorder, unspecified: Secondary | ICD-10-CM | POA: Diagnosis present

## 2019-06-02 DIAGNOSIS — Z8661 Personal history of infections of the central nervous system: Secondary | ICD-10-CM

## 2019-06-02 DIAGNOSIS — Z79899 Other long term (current) drug therapy: Secondary | ICD-10-CM | POA: Diagnosis not present

## 2019-06-02 DIAGNOSIS — K7460 Unspecified cirrhosis of liver: Secondary | ICD-10-CM | POA: Diagnosis present

## 2019-06-02 DIAGNOSIS — R4689 Other symptoms and signs involving appearance and behavior: Secondary | ICD-10-CM

## 2019-06-02 DIAGNOSIS — R188 Other ascites: Secondary | ICD-10-CM | POA: Diagnosis not present

## 2019-06-02 DIAGNOSIS — R040 Epistaxis: Secondary | ICD-10-CM | POA: Diagnosis not present

## 2019-06-02 DIAGNOSIS — I509 Heart failure, unspecified: Secondary | ICD-10-CM | POA: Diagnosis present

## 2019-06-02 DIAGNOSIS — B1920 Unspecified viral hepatitis C without hepatic coma: Secondary | ICD-10-CM | POA: Diagnosis present

## 2019-06-02 DIAGNOSIS — D631 Anemia in chronic kidney disease: Secondary | ICD-10-CM | POA: Diagnosis present

## 2019-06-02 DIAGNOSIS — Z1159 Encounter for screening for other viral diseases: Secondary | ICD-10-CM | POA: Diagnosis not present

## 2019-06-02 DIAGNOSIS — R17 Unspecified jaundice: Secondary | ICD-10-CM

## 2019-06-02 DIAGNOSIS — B180 Chronic viral hepatitis B with delta-agent: Secondary | ICD-10-CM

## 2019-06-02 DIAGNOSIS — G039 Meningitis, unspecified: Secondary | ICD-10-CM | POA: Diagnosis present

## 2019-06-02 DIAGNOSIS — Z720 Tobacco use: Secondary | ICD-10-CM | POA: Diagnosis not present

## 2019-06-02 DIAGNOSIS — E876 Hypokalemia: Secondary | ICD-10-CM | POA: Diagnosis present

## 2019-06-02 DIAGNOSIS — B20 Human immunodeficiency virus [HIV] disease: Secondary | ICD-10-CM

## 2019-06-02 DIAGNOSIS — F3111 Bipolar disorder, current episode manic without psychotic features, mild: Secondary | ICD-10-CM | POA: Diagnosis not present

## 2019-06-02 DIAGNOSIS — M7989 Other specified soft tissue disorders: Secondary | ICD-10-CM | POA: Diagnosis not present

## 2019-06-02 DIAGNOSIS — F17210 Nicotine dependence, cigarettes, uncomplicated: Secondary | ICD-10-CM | POA: Diagnosis not present

## 2019-06-02 DIAGNOSIS — B181 Chronic viral hepatitis B without delta-agent: Secondary | ICD-10-CM | POA: Diagnosis present

## 2019-06-02 DIAGNOSIS — Z8619 Personal history of other infectious and parasitic diseases: Secondary | ICD-10-CM

## 2019-06-02 DIAGNOSIS — K592 Neurogenic bowel, not elsewhere classified: Secondary | ICD-10-CM | POA: Diagnosis present

## 2019-06-02 DIAGNOSIS — Z982 Presence of cerebrospinal fluid drainage device: Secondary | ICD-10-CM

## 2019-06-02 DIAGNOSIS — B451 Cerebral cryptococcosis: Secondary | ICD-10-CM

## 2019-06-02 DIAGNOSIS — E8809 Other disorders of plasma-protein metabolism, not elsewhere classified: Secondary | ICD-10-CM | POA: Diagnosis present

## 2019-06-02 DIAGNOSIS — R1013 Epigastric pain: Secondary | ICD-10-CM | POA: Diagnosis not present

## 2019-06-02 DIAGNOSIS — I69311 Memory deficit following cerebral infarction: Secondary | ICD-10-CM

## 2019-06-02 DIAGNOSIS — N179 Acute kidney failure, unspecified: Secondary | ICD-10-CM | POA: Diagnosis present

## 2019-06-02 DIAGNOSIS — K7201 Acute and subacute hepatic failure with coma: Secondary | ICD-10-CM | POA: Diagnosis not present

## 2019-06-02 DIAGNOSIS — K7200 Acute and subacute hepatic failure without coma: Secondary | ICD-10-CM | POA: Diagnosis not present

## 2019-06-02 DIAGNOSIS — N182 Chronic kidney disease, stage 2 (mild): Secondary | ICD-10-CM | POA: Diagnosis present

## 2019-06-02 DIAGNOSIS — G40909 Epilepsy, unspecified, not intractable, without status epilepticus: Secondary | ICD-10-CM | POA: Diagnosis present

## 2019-06-02 DIAGNOSIS — T473X5A Adverse effect of saline and osmotic laxatives, initial encounter: Secondary | ICD-10-CM | POA: Diagnosis not present

## 2019-06-02 DIAGNOSIS — B1710 Acute hepatitis C without hepatic coma: Secondary | ICD-10-CM

## 2019-06-02 DIAGNOSIS — I5031 Acute diastolic (congestive) heart failure: Secondary | ICD-10-CM | POA: Diagnosis not present

## 2019-06-02 DIAGNOSIS — K7290 Hepatic failure, unspecified without coma: Secondary | ICD-10-CM | POA: Diagnosis present

## 2019-06-02 DIAGNOSIS — R609 Edema, unspecified: Secondary | ICD-10-CM | POA: Diagnosis present

## 2019-06-02 DIAGNOSIS — N319 Neuromuscular dysfunction of bladder, unspecified: Secondary | ICD-10-CM | POA: Diagnosis present

## 2019-06-02 DIAGNOSIS — Z9114 Patient's other noncompliance with medication regimen: Secondary | ICD-10-CM

## 2019-06-02 DIAGNOSIS — R600 Localized edema: Secondary | ICD-10-CM | POA: Diagnosis not present

## 2019-06-02 DIAGNOSIS — E871 Hypo-osmolality and hyponatremia: Secondary | ICD-10-CM | POA: Diagnosis present

## 2019-06-02 DIAGNOSIS — Z9119 Patient's noncompliance with other medical treatment and regimen: Secondary | ICD-10-CM

## 2019-06-02 DIAGNOSIS — K922 Gastrointestinal hemorrhage, unspecified: Secondary | ICD-10-CM | POA: Diagnosis present

## 2019-06-02 DIAGNOSIS — F419 Anxiety disorder, unspecified: Secondary | ICD-10-CM | POA: Diagnosis present

## 2019-06-02 DIAGNOSIS — B182 Chronic viral hepatitis C: Secondary | ICD-10-CM | POA: Diagnosis present

## 2019-06-02 DIAGNOSIS — K7010 Alcoholic hepatitis without ascites: Secondary | ICD-10-CM | POA: Diagnosis present

## 2019-06-02 DIAGNOSIS — M79606 Pain in leg, unspecified: Secondary | ICD-10-CM | POA: Diagnosis present

## 2019-06-02 DIAGNOSIS — Z888 Allergy status to other drugs, medicaments and biological substances status: Secondary | ICD-10-CM

## 2019-06-02 HISTORY — DX: Other symptoms and signs involving appearance and behavior: R46.89

## 2019-06-02 LAB — URINALYSIS, ROUTINE W REFLEX MICROSCOPIC
Glucose, UA: NEGATIVE mg/dL
Hgb urine dipstick: NEGATIVE
Ketones, ur: NEGATIVE mg/dL
Leukocytes,Ua: NEGATIVE
Nitrite: NEGATIVE
Protein, ur: NEGATIVE mg/dL
Specific Gravity, Urine: 1.011 (ref 1.005–1.030)
pH: 7 (ref 5.0–8.0)

## 2019-06-02 MED ORDER — LEVETIRACETAM 500 MG PO TABS
1000.0000 mg | ORAL_TABLET | Freq: Two times a day (BID) | ORAL | Status: DC
  Administered 2019-06-02 – 2019-06-08 (×12): 1000 mg via ORAL
  Filled 2019-06-02 (×12): qty 2

## 2019-06-02 MED ORDER — DOLUTEGRAVIR SODIUM 50 MG PO TABS
50.0000 mg | ORAL_TABLET | Freq: Every day | ORAL | Status: DC
  Administered 2019-06-03 – 2019-06-08 (×6): 50 mg via ORAL
  Filled 2019-06-02 (×6): qty 1

## 2019-06-02 MED ORDER — HYDROCODONE-ACETAMINOPHEN 5-325 MG PO TABS
1.0000 | ORAL_TABLET | ORAL | Status: DC | PRN
  Administered 2019-06-02 – 2019-06-03 (×2): 1 via ORAL
  Filled 2019-06-02 (×2): qty 1

## 2019-06-02 MED ORDER — FLUCONAZOLE 200 MG PO TABS: 200.0000 mg | ORAL_TABLET | Freq: Every day | ORAL | Status: DC

## 2019-06-02 MED ORDER — ACETAMINOPHEN 325 MG PO TABS: 650.0000 mg | ORAL_TABLET | ORAL | Status: DC | PRN

## 2019-06-02 MED ORDER — NICOTINE 21 MG/24HR TD PT24
21.0000 mg | MEDICATED_PATCH | Freq: Every day | TRANSDERMAL | Status: DC
  Administered 2019-06-03 – 2019-06-08 (×6): 21 mg via TRANSDERMAL
  Filled 2019-06-02 (×6): qty 1

## 2019-06-02 MED ORDER — QUETIAPINE FUMARATE 25 MG PO TABS
25.0000 mg | ORAL_TABLET | Freq: Two times a day (BID) | ORAL | Status: DC | PRN
  Administered 2019-06-08: 25 mg via ORAL
  Filled 2019-06-02: qty 1

## 2019-06-02 MED ORDER — DARUN-COBIC-EMTRICIT-TENOFAF 800-150-200-10 MG PO TABS
1.0000 | ORAL_TABLET | Freq: Every day | ORAL | Status: DC
  Administered 2019-06-03 – 2019-06-08 (×6): 1 via ORAL
  Filled 2019-06-02 (×7): qty 1

## 2019-06-02 MED ORDER — ONDANSETRON HCL 4 MG PO TABS
4.0000 mg | ORAL_TABLET | Freq: Four times a day (QID) | ORAL | Status: DC | PRN
  Administered 2019-06-03 – 2019-06-04 (×3): 4 mg via ORAL
  Filled 2019-06-02 (×4): qty 1

## 2019-06-02 MED ORDER — ENOXAPARIN SODIUM 40 MG/0.4ML ~~LOC~~ SOLN
40.0000 mg | SUBCUTANEOUS | Status: DC
  Filled 2019-06-02 (×3): qty 0.4

## 2019-06-02 MED ORDER — VALACYCLOVIR HCL 500 MG PO TABS
1000.0000 mg | ORAL_TABLET | ORAL | Status: DC
  Administered 2019-06-04 – 2019-06-06 (×2): 1000 mg via ORAL
  Filled 2019-06-02 (×2): qty 2

## 2019-06-02 MED ORDER — SULFAMETHOXAZOLE-TRIMETHOPRIM 800-160 MG PO TABS
1.0000 | ORAL_TABLET | Freq: Every day | ORAL | Status: DC
  Administered 2019-06-03: 1 via ORAL
  Filled 2019-06-02: qty 1

## 2019-06-02 NOTE — H&P (Signed)
History and Physical   Justin SpurrLinston R Sisneros ZOX:[id number] DOB: 03/30/1991 DOA: 06/02/2019  Referring MD/NP/PA: Dr. Daiva EvesVan Dam infectious disease  PCP: Daiva EvesVan Dam, Lisette Grinderornelius N, MD   Outpatient Specialists: ID Dr. Daiva EvesVan Dam  Patient coming from: Home  Chief Complaint: Bilateral leg swellings  HPI: Justin Ball is a 28 y.o. male with medical history significant of HIV disease with AIDS who has been noncompliant with medications last CD4 on record was 50 in 2018 who was seen through telemetry medicine today by infectious disease.  Patient has been having significant pain bilaterally with the swelling.  Denied any shortness of breath.  Denied any PND.  Denied any orthopnea.  He was asked to come in direct admission for work-up.  Partly because patient has been noncompliant and suspected CHF.  Patient also reported epistaxis recently.  He has been on retroviral therapy for his HIV.  Patient lives with grandmother who has been his primary care worker.  She complained of recent aggressive behavior.  Is get hepatitis B as well as hepatitis C.  Also has been on fluconazole for cryptococcal meningitis.  He has no new complaint as of now except for the pain in his legs..    Review of Systems: As per HPI otherwise 10 point review of systems negative.    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 and  near heriation of brain (required VP shunt)--- residual  CNS damage   Depression    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)    sores   Fecal incontinence 11/19/2017   Foley catheter in place    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   cerebral infection   History of cerebral infarction 05/20/2017   multiple ischemic infarction secondary to infection   History of scabies 2013   History of syphilis    Memory deficit after cerebral infarction    due to infection   Nausea and vomiting 01/14/2019   Neurogenic bladder    secondary to damage from brain infection   Neurogenic bowel    wears depends   Neuromuscular disorder (HCC)    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   Seizure disorder [facility](HCC)    neurologist-  dr Marjory Liespenumalli ([facility])--caused by cryptococcal meningitis/ multiple infarctions--  no seizure since hospitalization May 2018   Status epilepticus (HCC)    URI (upper respiratory infection) 02/24/2019   Visuospatial deficit     Past Surgical History:  Procedure Laterality Date   CYSTOSCOPY N/A 10/10/2017   Procedure: CYSTOSCOPY;  Surgeon: Sebastian AcheManny, Theodore, MD;  [facility] [place];  Service: Urology;  Laterality: N/A;   EXAMINATION UNDER ANESTHESIA N/A 12/28/2014   Procedure: EXAM UNDER ANESTHESIA;  Surgeon: Karie SodaSteven Gross, MD;  Location: [facility];  Service: General;  Laterality: N/A;   FLOOR OF MOUTH BIOPSY N/A 09/16/2016   Procedure: BIOPSY OF ORAL ABCESS;  Surgeon: Newman PiesSu Teoh, MD;  Location: [facility];  Service: ENT;  Laterality: N/A;   INCISION AND DRAINAGE ABSCESS N/A 09/16/2016   Procedure: INCISION AND DRAINAGE ABSCESS;  Surgeon: Newman PiesSu Teoh, MD;  Location: [facility];  Service: ENT;  Laterality: N/A;   INCISION AND DRAINAGE PERIRECTAL ABSCESS  09/ 03/ 2011   dr toth   INCISION AND DRAINAGE PERIRECTAL ABSCESS N/A 12/28/2014   Procedure: IRRIGATION AND DEBRIDEMENT PERIRECTAL ABSCESS;  Surgeon: Karie SodaSteven Gross, MD;  Location: [facility];  Service: General;  Laterality: N/A;  Fistula  repair and ablation   INSERTION OF SUPRAPUBIC CATHETER N/A 10/10/2017   Procedure: INSERTION OF SUPRAPUBIC CATHETER;  Surgeon: Sebastian AcheManny, Theodore, MD;  [facility];  Service: Urology;  Laterality: N/A;   LAPAROSCOPIC REVISION VENTRICULAR-PERITONEAL (V-P) SHUNT N/A 06/12/2017   Procedure: LAPAROSCOPIC REVISION VENTRICULAR-PERITONEAL (V-P) SHUNT;  Surgeon: Kinsinger, De BlanchLuke Aaron, MD;  Location: [facility];  Service: General;  Laterality: N/A;   SHUNT REMOVAL N/A 01/01/2018   Procedure: REMOVAL OF V-P SHUNT, PLACEMENT OF EVD;  Surgeon: Lisbeth RenshawNundkumar, Neelesh, MD;  Location: [facility];  Service: Neurosurgery;  Laterality: N/A;   TRANSTHORACIC ECHOCARDIOGRAM  06/02/2017   EF 65-70%/  trivial PR   VENTRICULOPERITONEAL SHUNT N/A 06/12/2017   Procedure: SHUNT INSERTION VENTRICULAR-PERITONEAL;  Surgeon: Lisbeth RenshawNundkumar, Neelesh, MD;  Location: [facility];  Service: Neurosurgery;  Laterality: N/A;     reports that he has been smoking cigarettes. He has a 2.50 pack-year smoking history. He has never used smokeless tobacco. He reports current alcohol use. He reports that he does not use drugs.  Allergies  Allergen Reactions   Acyclovir And Related Itching    Family History  Problem Relation Age of Onset   Hypertension Other      Prior to Admission medications   Medication Sig Start Date End Date Taking? Authorizing Provider  acetaminophen (TYLENOL) 325 MG tablet Take 2 tablets (650 mg total) by mouth every 4 (four) hours as needed for mild pain (temp > 101.5). 06/28/17   Cliffton Astersampbell, John, MD  Darunavir-Cobicisctat-Emtricitabine-Tenofovir Alafenamide [facility](SYMTUZA) 800-150-200-10 MG TABS Take 1 tablet by mouth daily with breakfast. 03/25/19   Daiva EvesVan Dam, Lisette Grinderornelius N, MD  dolutegravir (TIVICAY) 50 MG tablet Take 1 tablet (50 mg total) by mouth daily. 03/25/19   Randall HissVan Dam, [name], MD  fluconazole (DIFLUCAN) 200 MG tablet Take 1 tablet (200 mg total) by mouth daily. Patient not taking: Reported on 06/02/2019  03/25/19   Daiva EvesVan Dam, Lisette Grinderornelius N, MD  levETIRAcetam (KEPPRA) 1000 MG tablet Take 1 tablet (1,000 mg total) by mouth 2 (two) times daily. 03/25/19   Randall HissVan Dam, [name], MD  ondansetron [facility](ZOFRAN) 4 MG tablet Take 1 tablet (4 mg total) by mouth every 6 (six) hours as needed for nausea or vomiting. 03/25/19   Daiva EvesVan Dam, Lisette Grinderornelius N, MD  QUEtiapine (SEROQUEL) 25 MG tablet Take 1 tablet (25 mg total) by mouth 2 (two) times daily as needed (agitation). 03/25/19   Randall HissVan Dam, [name], MD  sulfamethoxazole-trimethoprim (BACTRIM DS,SEPTRA DS) 800-160 MG tablet Take 1 tablet by mouth daily. 03/25/19   Randall HissVan Dam, [name], MD  valACYclovir (VALTREX) 1000 MG tablet Take 1 tablet (1,000 mg total) by mouth 3 (three) times daily. Patient taking differently: Take 1,000 mg by mouth every Monday, Wednesday, and Friday.  11/14/18   Cliffton Astersampbell, John, MD    Physical Exam: Vitals:   06/02/19 1840 06/02/19 1945  BP: 123/71 110/63  Pulse: 99 95  Resp: 17 18  Temp: 99.4 F (37.4 C) 99.1 F (37.3 C)  TempSrc: Oral Oral  SpO2: 99%  100%      Constitutional: NAD, calm, comfortable, Vitals:   06/02/19 1840 06/02/19 1945  BP: 123/71 110/63  Pulse: 99 95  Resp: 17 18  Temp: 99.4 F (37.4 C) 99.1 F (37.3 C)  TempSrc: Oral Oral  SpO2: 99% 100%   Eyes: PERRL, lids and conjunctivae normal ENMT: Mucous membranes are moist. Posterior pharynx clear of any exudate or lesions.Normal dentition.  Neck: normal, supple, no masses, no thyromegaly Respiratory: clear to auscultation bilaterally, no wheezing, no crackles. Normal respiratory effort. No accessory muscle use.  Cardiovascular: Regular rate and rhythm, no murmurs / rubs / gallops.  2+ pedal edema bilaterally up to the thigh.  Nonpitting.. 2+ pedal pulses. No carotid bruits.  Abdomen: no tenderness, no masses palpated. No hepatosplenomegaly. Bowel sounds positive.  Musculoskeletal: no clubbing / cyanosis. No joint deformity upper and lower extremities. Good ROM, no  contractures. Normal muscle tone.  Skin: Lower extremities multiple rashes.  Warm to touch, no other lesions, ulcers. No induration Neurologic: CN 2-12 grossly intact. Sensation intact, DTR normal. Strength 5/5 in all 4.  Psychiatric: Normal judgment and insight. Alert and oriented x 3. Normal mood.     Labs on Admission: I have personally reviewed following labs and imaging studies  CBC: No results for input(s): WBC, NEUTROABS, HGB, HCT, MCV, PLT in the last 168 hours. Basic Metabolic Panel: No results for input(s): NA, K, CL, CO2, GLUCOSE, BUN, CREATININE, CALCIUM, MG, PHOS in the last 168 hours. GFR: CrCl cannot be calculated (Patient's most recent lab result is older than the maximum 21 days allowed.). Liver Function Tests: No results for input(s): AST, ALT, ALKPHOS, BILITOT, PROT, ALBUMIN in the last 168 hours. No results for input(s): LIPASE, AMYLASE in the last 168 hours. No results for input(s): AMMONIA in the last 168 hours. Coagulation Profile: No results for input(s): INR, PROTIME in the last 168 hours. Cardiac Enzymes: No results for input(s): CKTOTAL, CKMB, CKMBINDEX, TROPONINI in the last 168 hours. BNP (last 3 results) No results for input(s): PROBNP in the last 8760 hours. HbA1C: No results for input(s): HGBA1C in the last 72 hours. CBG: No results for input(s): GLUCAP in the last 168 hours. Lipid Profile: No results for input(s): CHOL, HDL, LDLCALC, TRIG, CHOLHDL, LDLDIRECT in the last 72 hours. Thyroid Function Tests: No results for input(s): TSH, T4TOTAL, FREET4, T3FREE, THYROIDAB in the last 72 hours. Anemia Panel: No results for input(s): VITAMINB12, FOLATE, FERRITIN, TIBC, IRON, RETICCTPCT in the last 72 hours. Urine analysis:    Component Value Date/Time   COLORURINE AMBER (A) 01/08/2019 1511   APPEARANCEUR CLEAR 01/08/2019 1511   LABSPEC 1.027 01/08/2019 1511   PHURINE 7.0 01/08/2019 1511   GLUCOSEU NEGATIVE 01/08/2019 1511   HGBUR NEGATIVE  01/08/2019 1511   BILIRUBINUR MODERATE (A) 01/08/2019 1511   KETONESUR NEGATIVE 01/08/2019 1511   PROTEINUR NEGATIVE 01/08/2019 1511   UROBILINOGEN 4.0 (H) 09/09/2014 1055   NITRITE NEGATIVE 01/08/2019 1511   LEUKOCYTESUR NEGATIVE 01/08/2019 1511   Sepsis Labs: @LABRCNTIP (procalcitonin:4,lacticidven:4) )No results found for this or any previous visit (from the past 240 hour(s)).   Radiological Exams on Admission: No results found.    Assessment/Plan Principal Problem:   Edema Active Problems:   Bipolar disorder ([place])   AIDS (acquired immune deficiency syndrome) (HCC)   Cryptococcal meningoencephalitis (HCC)   Tobacco abuse   Seizure disorder (HCC)   Hepatitis C virus infection without hepatic coma   Liver failure (HCC)   Aggressive behavior   Acute CHF (congestive heart failure) (  HCC)     #1 bilateral lower extremity edema: Patient will be admitted and monitored.  Based on his exam looks localized but nonpitting.  Will get bilateral lateral lower extremity Doppler ultrasound rule out DVT or other soft tissue causes.  Will give 1 dose of IV Lasix but hold off on further diuresis as patient has no obvious signs of fluid overload except the extremities.  We will get a chest x-ray as well as BNP.  Echocardiogram will also be considered.  #2 HIV disease: Appears to be under control follow-up with ID.  Patient currently on TIVICAY and Symtuza.  #3 tobacco abuse: Nicotine patch will be added.  #4 recent cryptococcal meningitis: Defer to infectious disease regarding treatment.  #5 seizure disorder: Continue home regimen and monitor.  #6 hepatitis B and C infections: Defer treatment to infectious disease  #7 bipolar disorder: Grandmother reported aggressive behavior.  Patient will be monitored closely and continue with home regimen.  #8 epistaxis: No active epistaxis at the moment.  Get initial lab work and monitor.     DVT prophylaxis: Lovenox Code Status: Full  code Family Communication: Grandmother over the phone Disposition Plan: Home Consults called: Consult infectious disease in the morning Admission status: Inpatient  Severity of Illness: The appropriate patient status for this patient is INPATIENT. Inpatient status is judged to be reasonable and necessary in order to provide the required intensity of service to ensure the patient's safety. The patient's presenting symptoms, physical exam findings, and initial radiographic and laboratory data in the context of their chronic comorbidities is felt to place them at high risk for further clinical deterioration. Furthermore, it is not anticipated that the patient will be medically stable for discharge from the hospital within 2 midnights of admission. The following factors support the patient status of inpatient.   " The patient's presenting symptoms include lower extremity edema. " The worrisome physical exam findings include bilateral swelling both legs. " The initial radiographic and laboratory data are worrisome because of currently pending. " The chronic co-morbidities include HIV with AIDS.   * I certify that at the point of admission it is my clinical judgment that the patient will require inpatient hospital care spanning beyond 2 midnights from the point of admission due to high intensity of service, high risk for further deterioration and high frequency of surveillance required.Lonia Blood*    Sender Rueb,LAWAL MD [facility] Pager [phone]  If 7PM-7AM, please contact night-coverage [email or web address] Password [email or web address]  06/02/2019, 11:45 PM

## 2019-06-02 NOTE — Progress Notes (Signed)
LPN called [facility] hospital for Hospital Admission request at 3:59pm. Admitting MD Dr. Earlie Counts for Volume Overload. Type of bed Tele med.  Patient stable enough for grandmother to transport. [facility] Admitting.  Eugenia Mcalpine, LPN

## 2019-06-02 NOTE — Progress Notes (Signed)
Subjective:   Chief complaint: Full lower extremity edema and recent problems with epistaxis   Patient ID: Justin Ball, male    DOB: [birth year], 28 y.o.   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  Subsequently had became infected and had to be removed.  Justin Ball HAD INIITIALLY been  doing relatively well while living with his grandmother who ensured that he took his medications.  Unfortunately though he ran off with a boyfriend even though Justin Ball is not Competent to make such decisions and ultimately contracted hepatitis C was admitted to the hospital for this.  Is been admitted several times with headaches and still had positive cryptococcal antigens and spinal fluid and being reinduced with amphotericin followed by fluconazole.  He then later came to the he came to the ER with a headache and blurry vision.  He underwent lumbar puncture which had a normal opening present pressure but which revealed a positive cryptococcal antigen in CSF and in serum.  Rather than be re-induced with amphotericin he was simply continued on fluconazole.  Upon admission he was found to have markedly elevated liver function tests with an AST of 604 ALT of 142 alkaline phosphatase of 227 and a bilirubin of 23.4.  He had icterus abdominal pain and nausea and vomiting.  He developed acute kidney injury likely due to dehydration while in the hospital.  My partner Dr. Linus Salmons changed his antiretrovirals from [facility] and [place] to [facility] and Jules Husbands  It was unclear to our team or hepatology what was causing his elevation of his transaminases and bilirubin.  Consideration was given to reactivation of hepatitis B if he had been off of his antiretrovirals but his hepatitis B was actually well controlled.  We still not  have his viral load back.  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  His hepatitis C was certainly active as well.  Ultimately his liver function test stabilized and his creatinine improved.  He was discharged to home.  Of note he also developed a pruritic rash on his back and arms.  Since discharge in the hospital he has been vomiting multiple times per day as he does not have any anti-nausea medicines  In talking to him and his grandmother several visits ago and  grandmother disclosed to me that he has been purchasing and by being large amounts of alcohol over the week to 2 weeks prior to his admission. labs in today or not becoming dramatically worse.   We ultimately had him admitted to the hospital of worsening liver failure back in January.  It was very clear that he is not competent to take care of himself and that he would benefit the being in a structured environment.   At that time arrangements were going to be made to try to have him placed in a skilled facility.  However ultimately grandmother mother decided that they could try to increase his personal care hours at home.  That at that time did not wish for placement in a group home or assisted living situation.  Since then Justin Ball is sporadically been seen in the clinic.  He had been complaining of epistaxis and made several appointments with me all of which he had not made until today.  Pharmacy showed me when the last time  he had filled his medications with them and this was in April.  His grandmother did bring in medications that did have pills in the most of them dated April some dated back into November there is no fluconazole present whatsoever.  In the interim months and had developed episodic episodes of epistaxis which concerned his sister and prompted calls to our clinic.  He is also complaining of severely painful lower extremity edema.  In talking to his grandmother she says that months  is becoming increasingly aggressive and that no one will ever visit the house anymore because of how aggressive he is and yelling at them.  Sounds also that his oral intake is not always ideal.  Also he does endorse drinking at least 2 drinks a day of alcohol which I told him he absolutely should not be doing with the condition of his liver.   I think he would benefit from being admitted to the hospital acutely to manage the cause of his lower extremity edema which could be multifactorial including heart failure poor nutrition endocrinological problems and other things.  More importantly though I do not think he is at all safe to be living at home with his grandmother and is clearly proven himself to be beyond the capacity of her to try to manage his care.  He needs to be seen by psychiatry with guards was aggressive behavior and his lack of competence to take care of himself.  I think he needs to be placed in a skilled facility or group home for the safety of himself and others.      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 and  near heriation of brain (required VP shunt)--- residual CNS damage   Depression    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)    sores   Fecal incontinence 11/19/2017   Foley catheter in place    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   cerebral infection   History of cerebral  infarction 05/20/2017   multiple ischemic infarction secondary to infection   History of scabies 2013   History of syphilis    Memory deficit after cerebral infarction    due to infection   Nausea and vomiting 01/14/2019   Neurogenic bladder    secondary to damage from brain infection   Neurogenic bowel    wears depends   Neuromuscular disorder (HCC)    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   Seizure disorder [facility](HCC)    neurologist-  dr Marjory Liespenumalli ([facility])--caused by cryptococcal meningitis/ multiple infarctions--  no seizure since hospitalization May 2018   Status epilepticus (HCC)    URI (upper respiratory infection) 02/24/2019   Visuospatial deficit     Past Surgical History:  Procedure Laterality Date   CYSTOSCOPY N/A 10/10/2017   Procedure: CYSTOSCOPY;  Surgeon: Sebastian AcheManny, Theodore, MD;  [facility];  Service: Urology;  Laterality: N/A;   EXAMINATION UNDER ANESTHESIA N/A 12/28/2014  Procedure: EXAM UNDER ANESTHESIA;  Surgeon: Karie SodaSteven Gross, MD;  Location: [facility];  Service: General;  Laterality: N/A;   FLOOR OF MOUTH BIOPSY N/A 09/16/2016   Procedure: BIOPSY OF ORAL ABCESS;  Surgeon: Newman PiesSu Teoh, MD;  Location: [facility];  Service: ENT;  Laterality: N/A;   INCISION AND DRAINAGE ABSCESS N/A 09/16/2016   Procedure: INCISION AND DRAINAGE ABSCESS;  Surgeon: Newman PiesSu Teoh, MD;  Location: [facility];  Service: ENT;  Laterality: N/A;   INCISION AND DRAINAGE PERIRECTAL ABSCESS  09/ 03/ 2011   dr toth   INCISION AND DRAINAGE PERIRECTAL ABSCESS N/A 12/28/2014   Procedure: IRRIGATION AND DEBRIDEMENT PERIRECTAL ABSCESS;  Surgeon: Karie SodaSteven Gross, MD;  Location: [facility];  Service: General;  Laterality: N/A;  Fistula repair and ablation   INSERTION OF SUPRAPUBIC CATHETER N/A 10/10/2017   Procedure: INSERTION OF SUPRAPUBIC CATHETER;  Surgeon: Sebastian AcheManny, Theodore, MD;  [facility];  Service: Urology;  Laterality: N/A;   LAPAROSCOPIC REVISION VENTRICULAR-PERITONEAL  (V-P) SHUNT N/A 06/12/2017   Procedure: LAPAROSCOPIC REVISION VENTRICULAR-PERITONEAL (V-P) SHUNT;  Surgeon: Kinsinger, De BlanchLuke Aaron, MD;  Location: [facility];  Service: General;  Laterality: N/A;   SHUNT REMOVAL N/A 01/01/2018   Procedure: REMOVAL OF V-P SHUNT, PLACEMENT OF EVD;  Surgeon: Lisbeth RenshawNundkumar, Neelesh, MD;  Location: [facility];  Service: Neurosurgery;  Laterality: N/A;   TRANSTHORACIC ECHOCARDIOGRAM  06/02/2017   EF 65-70%/  trivial PR   VENTRICULOPERITONEAL SHUNT N/A 06/12/2017   Procedure: SHUNT INSERTION VENTRICULAR-PERITONEAL;  Surgeon: Lisbeth RenshawNundkumar, Neelesh, MD;  Location: [facility];  Service: Neurosurgery;  Laterality: N/A;    Family History  Problem Relation Age of Onset   Hypertension Other       Social History   Socioeconomic History   Marital status: Single    Spouse name: Not on file   Number of children: Not on file   Years of education: Not on file   Highest education level: Not on file  Occupational History   Not on file  Social Needs   Financial resource strain: Not on file   Food insecurity:    Worry: Not on file    Inability: Not on file   Transportation needs:    Medical: Not on file    Non-medical: Not on file  Tobacco Use   Smoking status: Current Every Day Smoker    Packs/day: 0.50    Years: 5.00    Pack years: 2.50    Types: Cigarettes   Smokeless tobacco: Never Used   Tobacco comment: cutting back  Substance and Sexual Activity   Alcohol use: Yes   Drug use: No   Sexual activity: Yes    Partners: Male    Birth control/protection: Condom    Comment: irregular condom use; educated  Lifestyle   Physical activity:    Days per week: Not on file    Minutes per session: Not on file   Stress: Not on file  Relationships   Social connections:    Talks on phone: Not on file    Gets together: Not on file    Attends religious service: Not on file    Active member of club or organization: Not on file    Attends meetings of clubs or  organizations: Not on file    Relationship status: Not on file  Other Topics Concern   Not on file  Social History Narrative   Not on file    Allergies  Allergen Reactions   Acyclovir And Related Itching  Current Outpatient Medications:    acetaminophen (TYLENOL) 325 MG tablet, Take 2 tablets (650 mg total) by mouth every 4 (four) hours as needed for mild pain (temp > 101.5)., Disp: , Rfl:    Darunavir-Cobicisctat-Emtricitabine-Tenofovir Alafenamide (SYMTUZA) 800-150-200-10 MG TABS, Take 1 tablet by mouth daily with breakfast., Disp: 30 tablet, Rfl: 11   dolutegravir (TIVICAY) 50 MG tablet, Take 1 tablet (50 mg total) by mouth daily., Disp: 30 tablet, Rfl: 11   fluconazole (DIFLUCAN) 200 MG tablet, Take 1 tablet (200 mg total) by mouth daily., Disp: 30 tablet, Rfl: 11   levETIRAcetam (KEPPRA) 1000 MG tablet, Take 1 tablet (1,000 mg total) by mouth 2 (two) times daily., Disp: 60 tablet, Rfl: 11   ondansetron (ZOFRAN) 4 MG tablet, Take 1 tablet (4 mg total) by mouth every 6 (six) hours as needed for nausea or vomiting., Disp: 120 tablet, Rfl: 2   QUEtiapine (SEROQUEL) 25 MG tablet, Take 1 tablet (25 mg total) by mouth 2 (two) times daily as needed (agitation). (Patient not taking: Reported on 05/15/2019), Disp: 60 tablet, Rfl: 11   sulfamethoxazole-trimethoprim (BACTRIM DS,SEPTRA DS) 800-160 MG tablet, Take 1 tablet by mouth daily., Disp: 30 tablet, Rfl: 11   valACYclovir (VALTREX) 1000 MG tablet, Take 1 tablet (1,000 mg total) by mouth 3 (three) times daily. (Patient taking differently: Take 1,000 mg by mouth every Monday, Wednesday, and Friday. ), Disp: 30 tablet, Rfl: 0   Review of Systems  Unable to perform ROS: Dementia       Objective:   Physical Exam Constitutional:      General: He is not in acute distress.    Appearance: He is not diaphoretic.  HENT:     Head: Normocephalic.   Eyes:     General:        Right eye: No discharge.  Neck:      Musculoskeletal: Normal range of motion.     Vascular: No JVD.     Trachea: No tracheal deviation.  Cardiovascular:     Rate and Rhythm: Normal rate and regular rhythm.     Heart sounds: Normal heart sounds. No murmur. No friction rub.  Pulmonary:     Effort: Pulmonary effort is normal. No respiratory distress.     Breath sounds: Normal breath sounds. No wheezing or rales.  Chest:     Chest wall: No tenderness.  Abdominal:     General: Bowel sounds are normal. There is no distension.     Palpations: Abdomen is soft. There is no mass.     Tenderness: There is no abdominal tenderness. There is no guarding or rebound.  Musculoskeletal: Normal range of motion.        General: No deformity.     Right lower leg: Edema present.     Left lower leg: Edema present.  Skin:    General: Skin is warm and dry.     Findings: Rash present. No erythema.  Neurological:     General: No focal deficit present.     Mental Status: He is alert.     Cranial Nerves: No cranial nerve deficit.     Sensory: No sensory deficit.     Gait: Gait normal.     Comments: Oriented to person and place and more oriented than in the past  Psychiatric:        Mood and Affect: Mood is depressed.        Speech: Speech is delayed.        Behavior: Behavior is  slowed.        Cognition and Memory: Cognition is impaired. Memory is impaired.        Judgment: Judgment is impulsive.         Assessment & Plan:   Lower extremity edema and volume overload   Could be multifactorial could be related to heart failure, hypoalbuminemia or more likely liver failure and multiple other causes.  I think you be benefited by being admitted to the hospital where he can be worked up and managed.   Trying to manage him as an outpatient and diurese him and check labs will be a near impossibility.   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   His grandmother states that at home he has had aggressive behavior.  I have not known him to have is in the inpatient world but if he has this he will certainly need a sitter someone to ensure safety  He clearly does need to be seen by psychiatry as mentioned before.   Would check VL and CD4  HIV disease: Continue TIVICAY on Symtuza.  He had medications with him today but again the bottles were old so I do not know if he had some saved up he is certainly not filled med[facility].   Chronic hepatitis B without delta agent: His hep B viral load was only 20 copies so this seems to be well well controlled and would suggest that he was likely taking his antiretrovirals at least prior to admission to the hospital.  Chronic hepatitis C: Would like to treat this at some point but is hard to interpret his liver function test and his fiber sure is in the context of these fulminant flares of hepatitis'   Cryptococcal meningitis: would continue  his fluconazole if LFTs not terribly high  Epistaxis: He did have a pia on CBC when he was seen in the ER recently for epistaxis.  I do not know if he also might not have problems with the coagulation pathway given his multiple hits to his liver.   I spent greater than 40 minutes with the patient including greater than 50% of time in face to face counsel of the patient other regarding the need to admit him to work-up his volume overload and lower extremity edema multiple other comorbid conditions and long-term plans that we need to put in place to ensure that he is cared for probably not in some place where his grandmother is completely overwhelmed and he and others are at risk because of this.  And in coordination of his care.

## 2019-06-03 ENCOUNTER — Encounter (HOSPITAL_COMMUNITY): Payer: Self-pay

## 2019-06-03 ENCOUNTER — Inpatient Hospital Stay (HOSPITAL_COMMUNITY): Payer: Medicaid Other

## 2019-06-03 DIAGNOSIS — B181 Chronic viral hepatitis B without delta-agent: Secondary | ICD-10-CM

## 2019-06-03 DIAGNOSIS — Z8661 Personal history of infections of the central nervous system: Secondary | ICD-10-CM

## 2019-06-03 DIAGNOSIS — Z79899 Other long term (current) drug therapy: Secondary | ICD-10-CM

## 2019-06-03 DIAGNOSIS — N179 Acute kidney failure, unspecified: Secondary | ICD-10-CM

## 2019-06-03 DIAGNOSIS — B20 Human immunodeficiency virus [HIV] disease: Secondary | ICD-10-CM

## 2019-06-03 DIAGNOSIS — F17210 Nicotine dependence, cigarettes, uncomplicated: Secondary | ICD-10-CM

## 2019-06-03 DIAGNOSIS — R188 Other ascites: Secondary | ICD-10-CM

## 2019-06-03 DIAGNOSIS — M7989 Other specified soft tissue disorders: Secondary | ICD-10-CM

## 2019-06-03 DIAGNOSIS — B1920 Unspecified viral hepatitis C without hepatic coma: Secondary | ICD-10-CM

## 2019-06-03 LAB — COMPREHENSIVE METABOLIC PANEL
ALT: 59 U/L — ABNORMAL HIGH (ref 0–44)
ALT: 63 U/L — ABNORMAL HIGH (ref 0–44)
AST: 178 U/L — ABNORMAL HIGH (ref 15–41)
AST: 199 U/L — ABNORMAL HIGH (ref 15–41)
Albumin: 1.4 g/dL — ABNORMAL LOW (ref 3.5–5.0)
Albumin: 1.5 g/dL — ABNORMAL LOW (ref 3.5–5.0)
Alkaline Phosphatase: 157 U/L — ABNORMAL HIGH (ref 38–126)
Alkaline Phosphatase: 165 U/L — ABNORMAL HIGH (ref 38–126)
Anion gap: 7 (ref 5–15)
Anion gap: 8 (ref 5–15)
BUN: 5 mg/dL — ABNORMAL LOW (ref 6–20)
BUN: 6 mg/dL (ref 6–20)
CO2: 21 mmol/L — ABNORMAL LOW (ref 22–32)
CO2: 21 mmol/L — ABNORMAL LOW (ref 22–32)
Calcium: 8.3 mg/dL — ABNORMAL LOW (ref 8.9–10.3)
Calcium: 8.5 mg/dL — ABNORMAL LOW (ref 8.9–10.3)
Chloride: 107 mmol/L (ref 98–111)
Chloride: 107 mmol/L (ref 98–111)
Creatinine, Ser: 1.49 mg/dL — ABNORMAL HIGH (ref 0.61–1.24)
Creatinine, Ser: 1.57 mg/dL — ABNORMAL HIGH (ref 0.61–1.24)
GFR calc Af Amer: >60 mL/min (ref >60)
GFR calc Af Amer: >60 mL/min (ref >60)
GFR calc non Af Amer: 59 mL/min — ABNORMAL LOW (ref >60)
GFR calc non Af Amer: >60 mL/min (ref >60)
Glucose, Bld: 109 mg/dL — ABNORMAL HIGH (ref 70–99)
Glucose, Bld: 80 mg/dL (ref 70–99)
Potassium: 3 mmol/L — ABNORMAL LOW (ref 3.5–5.1)
Potassium: 3.2 mmol/L — ABNORMAL LOW (ref 3.5–5.1)
Sodium: 135 mmol/L (ref 135–145)
Sodium: 136 mmol/L (ref 135–145)
Total Bilirubin: 10 mg/dL — ABNORMAL HIGH (ref 0.3–1.2)
Total Bilirubin: 9.4 mg/dL — ABNORMAL HIGH (ref 0.3–1.2)
Total Protein: 8.8 g/dL — ABNORMAL HIGH (ref 6.5–8.1)
Total Protein: 9.5 g/dL — ABNORMAL HIGH (ref 6.5–8.1)

## 2019-06-03 LAB — RETICULOCYTES
Immature Retic Fract: 8.5 % (ref 2.3–15.9)
RBC.: 2.12 MIL/uL — ABNORMAL LOW (ref 4.22–5.81)
Retic Count, Absolute: 66.6 10*3/uL (ref 19.0–186.0)
Retic Ct Pct: 3.1 % (ref 0.4–3.1)

## 2019-06-03 LAB — CBC WITH DIFFERENTIAL/PLATELET
Abs Immature Granulocytes: 0.04 10*3/uL (ref 0.00–0.07)
Abs Immature Granulocytes: 0.03 10*3/uL (ref 0.00–0.07)
Basophils Absolute: 0 10*3/uL (ref 0.0–0.1)
Basophils Absolute: 0 10*3/uL (ref 0.0–0.1)
Basophils Relative: 1 %
Basophils Relative: 0 %
Eosinophils Absolute: 0 10*3/uL (ref 0.0–0.5)
Eosinophils Absolute: 0.1 10*3/uL (ref 0.0–0.5)
Eosinophils Relative: 1 %
Eosinophils Relative: 2 %
HCT: 23.4 % — ABNORMAL LOW (ref 39.0–52.0)
HCT: 20.8 % — ABNORMAL LOW (ref 39.0–52.0)
Hemoglobin: 8.3 g/dL — ABNORMAL LOW (ref 13.0–17.0)
Hemoglobin: 7.4 g/dL — ABNORMAL LOW (ref 13.0–17.0)
Immature Granulocytes: 1 %
Immature Granulocytes: 1 %
Lymphocytes Relative: 38 %
Lymphocytes Relative: 36 %
Lymphs Abs: 1.3 10*3/uL (ref 0.7–4.0)
Lymphs Abs: 1.5 10*3/uL (ref 0.7–4.0)
MCH: 34.6 pg — ABNORMAL HIGH (ref 26.0–34.0)
MCH: 34.9 pg — ABNORMAL HIGH (ref 26.0–34.0)
MCHC: 35.5 g/dL (ref 30.0–36.0)
MCHC: 35.6 g/dL (ref 30.0–36.0)
MCV: 97.5 fL (ref 80.0–100.0)
MCV: 98.1 fL (ref 80.0–100.0)
Monocytes Absolute: 0.3 10*3/uL (ref 0.1–1.0)
Monocytes Absolute: 0.3 10*3/uL (ref 0.1–1.0)
Monocytes Relative: 8 %
Monocytes Relative: 8 %
Neutro Abs: 1.7 10*3/uL (ref 1.7–7.7)
Neutro Abs: 2.2 10*3/uL (ref 1.7–7.7)
Neutrophils Relative %: 51 %
Neutrophils Relative %: 53 %
Platelets: 102 10*3/uL — ABNORMAL LOW (ref 150–400)
Platelets: 111 10*3/uL — ABNORMAL LOW (ref 150–400)
RBC: 2.4 MIL/uL — ABNORMAL LOW (ref 4.22–5.81)
RBC: 2.12 MIL/uL — ABNORMAL LOW (ref 4.22–5.81)
RDW: 18.6 % — ABNORMAL HIGH (ref 11.5–15.5)
RDW: 18.6 % — ABNORMAL HIGH (ref 11.5–15.5)
WBC: 3.4 10*3/uL — ABNORMAL LOW (ref 4.0–10.5)
WBC: 4.1 10*3/uL (ref 4.0–10.5)
nRBC: 0 % (ref 0.0–0.2)
nRBC: 0.5 % — ABNORMAL HIGH (ref 0.0–0.2)

## 2019-06-03 LAB — PROTIME-INR
INR: 2.2 — ABNORMAL HIGH (ref 0.8–1.2)
Prothrombin Time: 24.1 seconds — ABNORMAL HIGH (ref 11.4–15.2)

## 2019-06-03 LAB — AMMONIA: Ammonia: 99 umol/L — ABNORMAL HIGH (ref 9–35)

## 2019-06-03 LAB — BRAIN NATRIURETIC PEPTIDE: B Natriuretic Peptide: 129.1 pg/mL — ABNORMAL HIGH (ref 0.0–100.0)

## 2019-06-03 LAB — MAGNESIUM: Magnesium: 1.5 mg/dL — ABNORMAL LOW (ref 1.7–2.4)

## 2019-06-03 LAB — TYPE AND SCREEN
ABO/RH(D): B NEG
Antibody Screen: NEGATIVE

## 2019-06-03 LAB — TSH: TSH: 2.743 u[IU]/mL (ref 0.350–4.500)

## 2019-06-03 LAB — SARS CORONAVIRUS 2: SARS Coronavirus 2: NOT DETECTED

## 2019-06-03 LAB — T4, FREE: Free T4: 1.27 ng/dL — ABNORMAL HIGH (ref 0.61–1.12)

## 2019-06-03 MED ORDER — SODIUM CHLORIDE 0.9 % IV SOLN
1.0000 g | INTRAVENOUS | Status: DC
  Administered 2019-06-03 – 2019-06-04 (×2): 1 g via INTRAVENOUS
  Filled 2019-06-03 (×2): qty 10

## 2019-06-03 MED ORDER — MAGNESIUM SULFATE 2 GM/50ML IV SOLN
2.0000 g | Freq: Once | INTRAVENOUS | Status: AC
  Administered 2019-06-03: 2 g via INTRAVENOUS
  Filled 2019-06-03: qty 50

## 2019-06-03 MED ORDER — POTASSIUM CHLORIDE CRYS ER 20 MEQ PO TBCR
40.0000 meq | EXTENDED_RELEASE_TABLET | Freq: Once | ORAL | Status: AC
  Administered 2019-06-03: 40 meq via ORAL
  Filled 2019-06-03: qty 2

## 2019-06-03 MED ORDER — HYDROXYZINE HCL 25 MG PO TABS
25.0000 mg | ORAL_TABLET | Freq: Three times a day (TID) | ORAL | Status: DC | PRN
  Administered 2019-06-03 – 2019-06-04 (×2): 25 mg via ORAL
  Filled 2019-06-03 (×2): qty 1

## 2019-06-03 MED ORDER — ALBUMIN HUMAN 25 % IV SOLN
25.0000 g | Freq: Once | INTRAVENOUS | Status: AC
  Administered 2019-06-03 (×2): 12.5 g via INTRAVENOUS
  Filled 2019-06-03: qty 100

## 2019-06-03 MED ORDER — LACTULOSE 10 GM/15ML PO SOLN
20.0000 g | Freq: Three times a day (TID) | ORAL | Status: DC
  Administered 2019-06-03 – 2019-06-08 (×8): 20 g via ORAL
  Filled 2019-06-03 (×11): qty 30

## 2019-06-03 MED ORDER — FLUCONAZOLE 200 MG PO TABS
200.0000 mg | ORAL_TABLET | Freq: Every day | ORAL | Status: DC
  Administered 2019-06-03 – 2019-06-08 (×6): 200 mg via ORAL
  Filled 2019-06-03 (×6): qty 1

## 2019-06-03 MED ORDER — SULFAMETHOXAZOLE-TRIMETHOPRIM 800-160 MG PO TABS
1.0000 | ORAL_TABLET | Freq: Every day | ORAL | Status: DC
  Filled 2019-06-03: qty 1

## 2019-06-03 MED ORDER — OXYCODONE HCL 5 MG PO TABS
5.0000 mg | ORAL_TABLET | Freq: Four times a day (QID) | ORAL | Status: DC | PRN
  Administered 2019-06-03 – 2019-06-07 (×12): 5 mg via ORAL
  Filled 2019-06-03 (×13): qty 1

## 2019-06-03 NOTE — Plan of Care (Signed)
  Problem: Education: Goal: Knowledge of General Education information will improve Description: Including pain rating scale, medication(s)/side effects and non-pharmacologic comfort measures Outcome: Progressing   Problem: Clinical Measurements: Goal: Ability to maintain clinical measurements within normal limits will improve Outcome: Progressing Goal: Will remain free from infection Outcome: Progressing   

## 2019-06-03 NOTE — Consult Note (Signed)
Agenda for Infectious Disease    Date of Admission:  06/02/2019     Total days of antibiotics 0  ART - Symtuza + Tivicay         Reason for Consult: HIV/AIDS, Hep B/C    Referring Provider: Tommy Ball direct admit from clinic  Primary Care Provider: Tommy Ball, Justin Islam, MD   Assessment: Justin Ball is a 28 y.o. male well known to our team with a history of HIV, (+) AIDS with recent CD4 just at 67 in March 2020 and VL under good control @ 21 copies. 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 Since this time he has required several admissions for liver related concerns/hepatitis flares. Multiple no shows in the outpatient setting as well with concern over his family's inability to provide him good care. Apparently Justin Ball has shown signs of aggression in the home (although he minimizes this to saying "he just gets angry some times because he feels he is in prison.") With no significant change in LFTs vs 3 months ago would resume Fluconazole 200 mg QD for secondary prophylaxis until his CD4 counts return. Continue Bactrim single strength QD for OI prophylaxis as well.    While he may not be on the same page yet about considering a supervised setting for his care we spent a lot of time discussing and pointing out lapses in his care. I discussed with Dr. Posey Ball and will have PT/OT see him and CSW/CM team to facilitate - greatly appreciated.   Hepatitis B had been < 20 indicating treatment compliance with his medications.   We have not been able to get his hepatitis c treated yet due to difficulty getting him in clinic. His serum FibroTest indicates cirrhosis, which may be accurate given his ongoing low platelet counts, however with other non-infectious confounders for hepatitis (steatosis, ETOH) difficult to interpret. On Ultrasound in January 2020 he had a normal appearing texture of the  liver w/o mass effect. May consider outpatient elastography vs liver biopsy to help with staging as he has multiple factors that could accelerate his fibrosis progression. At this point he may require 6 months of epculsa to treat given his flares and signs of liver decompensation.   Peripheral edema, likely multifactorial in the setting of known hepatic failure. ?need for echocardiogram with associated BNP elevation + AKI. Normal in 05/2017 - will defer to Dr. Posey Ball for need. B/L LE dopplers pending - negative homan's sign B/L. Mostly has pain directly over ankles bilaterally with squeezing. Otherwise no known injury.    Plan: 1. Continue Symtuza + Tivicay + Bactrim daily  2. Fluconazole 200 mg QD to resume  3. Appreciate Dr. Posey Ball and CSW/CM team to help with very difficult case   Principal Problem:   Edema Active Problems:   AIDS (acquired immune deficiency syndrome) ([place])   History of cryptococcal meningitis   Liver failure ([place])   Chronic viral hepatitis B without coma and with delta agent (HCC)   Hepatitis C virus infection without hepatic coma   Bipolar disorder (HCC)   Tobacco abuse   Seizure disorder (HCC)   Aggressive behavior   Acute CHF (congestive heart failure) (HCC)   Epistaxis   . Darunavir-Cobicisctat-Emtricitabine-Tenofovir Alafenamide  1 tablet Oral Q breakfast  . dolutegravir  50 mg Oral Daily  . enoxaparin (LOVENOX) injection  40 mg Subcutaneous Q24H  .  lactulose  20 g Oral TID  . levETIRAcetam  1,000 mg Oral BID  . nicotine  21 mg Transdermal Daily  . [START ON 06/04/2019] valACYclovir  1,000 mg Oral Q M,W,F    HPI: Justin Ball is a 28 y.o. male admitted to the hospital from ID clinic yesterday with new significant lower extremity edema in the setting of known liver disease. His grandmother has had a difficult time taking care of him at home with his multiple and acute/chronic medical problems.   Justin Ball tells me that he was doing well and "taking all of  his medications." He went to the beach with his family recently and since returning home he has noticed increased leg swelling and pain around the ankles that he estimates has been there for 2 days now. He eats a liberal diet and has a lot of fast foods. Drinks 1-2 alcoholic drinks 3 days a week and usually in the evenings to "relax and go to sleep." He has noticed less urination during this time frame as well. Apparently he has been having a lot of trouble sleeping lately but then goes on to say that the nights he takes his celexa he is "out like that."   When asked about his HIV medications he says "I take them every day." He cannot tell me the names of the medications or the colors of the pills (thinks he is taking a gray one). His grandmother discussed yesterday with Dr. Tommy Ball that she cannot take care of him. Justin Ball acknowledges this also but is not of the same opinion. He has some vomiting that happens a few days a week that he attributes to overeating. He always feels hungry and "empty inside." No cough/pulmonary complaints. No SOB with walking around but mostly stays in seated/stationary locations. Does not participate in any exercise.   Significant Labs:  BNP 129 Ammonia 99 TBili 10 AST 178  ALT 59 Alk Phos 157 Plt 92 - 111 Hgb 7.26 February 2019 - FibroTest indicating Metavir F4 (cirrhosis)   Review of Systems: Review of Systems  Constitutional: Positive for malaise/fatigue. Negative for chills, fever and weight loss.  HENT: Positive for nosebleeds. Negative for sore throat.   Eyes: Negative for blurred vision and pain.  Respiratory: Negative for cough, sputum production and shortness of breath.   Cardiovascular: Positive for leg swelling. Negative for chest pain and orthopnea.  Gastrointestinal: Positive for nausea and vomiting. Negative for abdominal pain, blood in stool, constipation and diarrhea.  Genitourinary: Negative for dysuria.  Musculoskeletal: Positive for joint pain  (b/l ankles ). Negative for falls.  Skin: Positive for itching.  Neurological: Negative for dizziness, seizures, weakness and headaches.  Endo/Heme/Allergies: Negative for polydipsia.  Psychiatric/Behavioral: Negative for depression. The patient is not nervous/anxious.     Past Medical History:  Diagnosis Date  . Aggressive behavior 06/02/2019  . AIDS due to HIV-I [facility]) infectious disease--- dr Lucianne Lei dam   CD4=50 on 08-02-2017  . Alcoholic hepatitis 0/09/9322  . Anxiety   . 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 Bipolar 1 disorder ([place])   . Chronic viral hepatitis B without coma and with delta agent ([place])   . Cognitive communication deficit    working w/ therapy  . 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 Depression   . 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 Fecal incontinence 11/19/2017  . Foley catheter in place   . 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 History of scabies 2013  . History of syphilis   . 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 Nausea and vomiting 01/14/2019  . 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 Neurogenic bowel    wears depends  . Neuromuscular disorder ([place])   . 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 Seizure disorder [facility])    neurologist-  dr Leta Baptist ([place] [facility])--caused by cryptococcal meningitis/ multiple infarctions--  no seizure since hospitalization May 2018  . Status epilepticus ([place])   . URI (upper respiratory infection) 02/24/2019  . Visuospatial deficit     Social History    Tobacco Use  . Smoking status: Current Every Day Smoker    Packs/day: 0.50    Years: 5.00    Pack years: 2.50    Types: Cigarettes  . Smokeless tobacco: Never Used  . Tobacco comment: cutting back  Substance Use Topics  . Alcohol use: Yes  . Drug use: No    Family History  Problem Relation Age of Onset  . Hypertension Other    Allergies  Allergen Reactions  . Acyclovir And Related Itching    OBJECTIVE: Blood pressure 131/74, pulse 92, temperature 98.3 F (36.8 C), temperature source Oral, resp. rate 17, height '6\' 1"'  (1.854 m), weight 87.1 kg, SpO2 98 %.  Physical Exam Constitutional:      General: He is not in acute distress.    Comments: Up walking in room to the bathroom. Pleasant and otherwise well-appearing young male.   HENT:     Mouth/Throat:     Mouth: Mucous membranes are dry.     Pharynx: No oropharyngeal exudate.  Eyes:     General: Scleral icterus present.     Pupils: Pupils are equal, round, and reactive to light.  Cardiovascular:     Rate and Rhythm: Normal rate and regular rhythm.     Pulses: Normal pulses.     Heart sounds: No murmur.  Pulmonary:     Effort: Pulmonary effort is normal.     Breath sounds: Normal breath sounds. No rhonchi or rales.  Abdominal:     General: There is no distension.     Palpations: Abdomen is soft.     Tenderness: There is no abdominal tenderness.     Comments: Mild ascites  Musculoskeletal:        General: Tenderness (b/l ankles) present.     Right lower leg: Edema present.     Left lower leg: Edema present.  Skin:    General: Skin is warm and dry.     Capillary Refill: Capillary refill takes less than 2 seconds.     Coloration: Skin is jaundiced.     Comments: Small dark macules scattered over lower extremeties   Neurological:     General: No focal deficit present.     Mental Status: He is oriented to person, place, and time.  Psychiatric:        Mood and Affect: Mood normal.     Lab Results Lab Results   Component Value Date   WBC 4.1 06/03/2019   HGB 7.4 (L) 06/03/2019   HCT 20.8 (  L) 06/03/2019   MCV 98.1 06/03/2019   PLT 111 (L) 06/03/2019    Lab Results  Component Value Date   CREATININE 1.57 (H) 06/03/2019   BUN 6 06/03/2019   NA 136 06/03/2019   K 3.0 (L) 06/03/2019   CL 107 06/03/2019   CO2 21 (L) 06/03/2019    Lab Results  Component Value Date   ALT 59 (H) 06/03/2019   AST 178 (H) 06/03/2019   ALKPHOS 157 (H) 06/03/2019   BILITOT 9.4 (H) 06/03/2019     Microbiology: Recent Results (from the past 240 hour(s))  SARS Coronavirus 2     Status: None   Collection Time: 06/03/19 12:30 AM  Result Value Ref Range Status   SARS Coronavirus 2 NOT DETECTED NOT DETECTED Final    Comment: (NOTE) SARS-CoV-2 target nucleic acids are NOT DETECTED. The SARS-CoV-2 RNA is generally detectable in upper and lower respiratory specimens during the acute phase of infection.  Negative  results do not preclude SARS-CoV-2 infection, do not rule out co-infections with other pathogens, and should not be used as the sole basis for treatment or other patient management decisions.  Negative results must be combined with clinical observations, patient history, and epidemiological information. The expected result is Not Detected. Fact Sheet for Patients: http://www.biofiredefense.com/wp-content/uploads/2020/03/BIOFIRE-COVID -19-patients.pdf Fact Sheet for Healthcare Providers: http://www.biofiredefense.com/wp-content/uploads/2020/03/BIOFIRE-COVID -19-hcp.pdf This test is not yet approved or cleared by the [place] and  has been authorized for detection and/or diagnosis of SARS-CoV-2 by FDA under an Emergency Use Authorization (EUA).  This EUA will remain in effec t (meaning this test can be used) for the duration of  the COVID-19 declaration under Section 564(b)(1) of the Act, 21 U.S.C. section 360bbb-3(b)(1), unless the authorization is terminated or revoked sooner. Performed at  [facility], [place] [address] [place], [place] [address]     Janene Madeira, MSN, NP-C [facility] for Infectious [place] Pager: [phone]  06/03/2019 10:46 AM

## 2019-06-03 NOTE — Progress Notes (Signed)
Pt reported second episode of emesis. Yellow colored bile observed to floor and some in toilet. Denies nausea or abd pain at this time. States it's r/t something he ate. Assessment noted. MD notified.

## 2019-06-03 NOTE — Progress Notes (Signed)
[facility] Progress Note  Patient: Justin Ball:[id number]   PCP: [name], Lisette Grinder, MD DOB: 1991/04/28   DOA: 06/02/2019   DOS: 06/03/2019   Date of Service: the patient was seen and examined on 06/03/2019  Brief hospital course: Pt. with PMH of HIV disease with AIDS who has been noncompliant with medications; admitted on 06/02/2019, presented with complaint of bilateral leg swelling, was found to have decompensated cirrhosis. Currently further plan is continue current care.  Subjective: has some vomiting, still has sweelling in legs, no blood anywhere  Assessment and Plan: #1 bilateral lower extremity edema:  Etiology not clear for now. Most likely this is hypoalbuminemia. We will give IV albumin. Patient received IV Lasix with worsening renal function therefore we will hold off on further diuresis. Compression stockings as much as can tolerate. Lower extremity Doppler negative for any DVT. Mildly elevated free T4 but do not think that that this is hyperthyroidism. Monitor for now.   #2 HIV disease: Appears to be under control follow-up with ID.  Patient currently on TIVICAY and Symtuza.  #3 tobacco abuse: Nicotine patch will be added.  #4 recent cryptococcal meningitis: Defer to infectious disease regarding treatment.  #5 seizure disorder: Continue home regimen and monitor.  #6 hepatitis B and C infections: Defer treatment to infectious disease  #7 bipolar disorder: Grandmother reported aggressive behavior.  Patient will be monitored closely and continue with home regimen.  #8 epistaxis: No active epistaxis at the moment.  Get initial lab work and monitor.  9.  Anemia. Etiology not clear. Likely associated with slow GI bleed. We will perform further work-up.  Monitor H&H. Rule out hemolysis or other etiology.  Diet: Cardiac diet DVT Prophylaxis: SCD, pharmacological prophylaxis contraindicated due to Anemia and thrombocytopenia  Advance goals of care  discussion: Full code  Family Communication: no family was present at bedside, at the time of interview.   Disposition:  Discharge to Home .  Consultants: Infectious disease Procedures: None  Scheduled Meds:  Darunavir-Cobicisctat-Emtricitabine-Tenofovir Alafenamide  1 tablet Oral Q breakfast   dolutegravir  50 mg Oral Daily   enoxaparin (LOVENOX) injection  40 mg Subcutaneous Q24H   fluconazole  200 mg Oral Daily   lactulose  20 g Oral TID   levETIRAcetam  1,000 mg Oral BID   nicotine  21 mg Transdermal Daily   [START ON 06/04/2019] sulfamethoxazole-trimethoprim  1 tablet Oral Daily   [START ON 06/04/2019] valACYclovir  1,000 mg Oral Q M,W,F   Continuous Infusions:  cefTRIAXone (ROCEPHIN)  IV 1 g (06/03/19 1155)   PRN Meds: acetaminophen, hydrOXYzine, ondansetron, oxyCODONE, QUEtiapine Antibiotics: Anti-infectives (From admission, onward)   Start     Dose/Rate Route Frequency Ordered Stop   06/04/19 1000  valACYclovir (VALTREX) tablet 1,000 mg     1,000 mg Oral Every M-W-F 06/02/19 2311     06/04/19 1000  sulfamethoxazole-trimethoprim (BACTRIM DS) 800-160 MG per tablet 1 tablet     1 tablet Oral Daily 06/03/19 1120     06/03/19 1300  fluconazole (DIFLUCAN) tablet 200 mg     200 mg Oral Daily 06/03/19 1129     06/03/19 1100  cefTRIAXone (ROCEPHIN) 1 g in sodium chloride 0.9 % 100 mL IVPB     1 g 200 mL/hr over 30 Minutes Intravenous Every 24 hours 06/03/19 1037     06/03/19 1000  dolutegravir (TIVICAY) tablet 50 mg     50 mg Oral Daily 06/02/19 2311     06/03/19 1000  fluconazole (  DIFLUCAN) tablet 200 mg  Status:  Discontinued     200 mg Oral Daily 06/02/19 2311 06/02/19 2338   06/03/19 1000  sulfamethoxazole-trimethoprim (BACTRIM DS) 800-160 MG per tablet 1 tablet  Status:  Discontinued     1 tablet Oral Daily 06/02/19 2311 06/03/19 1036   06/03/19 0700  Darunavir-Cobicisctat-Emtricitabine-Tenofovir Alafenamide (SYMTUZA) 800-150-200-10 MG TABS 1 tablet     1  tablet Oral Daily with breakfast 06/02/19 2311         Objective: Physical Exam: Vitals:   06/03/19 0507 06/03/19 0742 06/03/19 1145 06/03/19 1539  BP: (!) 106/59 131/74 117/67 (!) 102/53  Pulse: (!) 102 92 96 (!) 106  Resp: 18 17 16 17   Temp: 98.7 F (37.1 C) 98.3 F (36.8 C) 97.9 F (36.6 C) 98.2 F (36.8 C)  TempSrc: Oral Oral Oral Oral  SpO2: 97% 98% 100% 99%  Weight: 87.1 kg     Height:        Intake/Output Summary (Last 24 hours) at 06/03/2019 1813 Last data filed at 06/03/2019 1700 Gross per 24 hour  Intake 1125.32 ml  Output 200 ml  Net 925.32 ml   Filed Weights   06/03/19 0425 06/03/19 0507  Weight: 85.3 kg 87.1 kg   General: alert and oriented to time, place, and person. Appear in mild distress, affect appropriate Eyes: PERRL, Conjunctiva normal ENT: Oral Mucosa Clear, moist  Neck: no JVD, no Abnormal Mass Or lumps Cardiovascular: S1 and S2 Present, no Murmur, peripheral pulses symmetrical Respiratory: normal respiratory effort, Bilateral Air entry equal and Decreased, no use of accessory muscle, Clear to Auscultation, no Crackles, no wheezes Abdomen: Bowel Sound present, Soft and no tenderness, no hernia Skin: no rashes  Extremities: bilateral  Pedal edema, bilateral  calf tenderness Neurologic: normal without focal findings, mental status, speech normal, alert and oriented x3, PERLA, Motor strength 5/5 and symmetric and sensation grossly normal to light touch Gait not checked due to patient safety concerns  Data Reviewed: CBC: Recent Labs  Lab 06/03/19 0007 06/03/19 0757  WBC 3.4* 4.1  NEUTROABS 1.7 2.2  HGB 8.3* 7.4*  HCT 23.4* 20.8*  MCV 97.5 98.1  PLT 102* 111*   Basic Metabolic Panel: Recent Labs  Lab 06/03/19 0007 06/03/19 0757  NA 135 136  K 3.2* 3.0*  CL 107 107  CO2 21* 21*  GLUCOSE 109* 80  BUN 5* 6  CREATININE 1.49* 1.57*  CALCIUM 8.3* 8.5*  MG  --  1.5*    Liver Function Tests: Recent Labs  Lab 06/03/19 0007  06/03/19 0757  AST 199* 178*  ALT 63* 59*  ALKPHOS 165* 157*  BILITOT 10.0* 9.4*  PROT 9.5* 8.8*  ALBUMIN 1.5* 1.4*   No results for input(s): LIPASE, AMYLASE in the last 168 hours. Recent Labs  Lab 06/03/19 0757  AMMONIA 99*   Coagulation Profile: Recent Labs  Lab 06/03/19 0757  INR 2.2*   Cardiac Enzymes: No results for input(s): CKTOTAL, CKMB, CKMBINDEX, TROPONINI in the last 168 hours. BNP (last 3 results) No results for input(s): PROBNP in the last 8760 hours. CBG: No results for input(s): GLUCAP in the last 168 hours. Studies: Vas [place] Lower Extremity Venous (dvt)  Result Date: 06/03/2019  Lower Venous Study Indications: Edema.  Comparison Study: No prior Performing Technologist: Jeb LeveringJill Parker RDMS, RVT  Examination Guidelines: A complete evaluation includes B-mode imaging, spectral Doppler, color Doppler, and power Doppler as needed of all accessible portions of each vessel. Bilateral testing is considered an integral part of  a complete examination. Limited examinations for reoccurring indications may be performed as noted.  +---------+---------------+---------+-----------+----------+-------+  RIGHT     Compressibility Phasicity Spontaneity Properties Summary  +---------+---------------+---------+-----------+----------+-------+  CFV       Full            Yes       Yes                             +---------+---------------+---------+-----------+----------+-------+  SFJ       Full                                                      +---------+---------------+---------+-----------+----------+-------+  FV Prox   Full                                                      +---------+---------------+---------+-----------+----------+-------+  FV Mid    Full                                                      +---------+---------------+---------+-----------+----------+-------+  FV Distal Full                                                       +---------+---------------+---------+-----------+----------+-------+  PFV       Full                                                      +---------+---------------+---------+-----------+----------+-------+  POP       Full            Yes       Yes                             +---------+---------------+---------+-----------+----------+-------+  PTV       Full                                                      +---------+---------------+---------+-----------+----------+-------+  PERO      Full                                                      +---------+---------------+---------+-----------+----------+-------+   +---------+---------------+---------+-----------+----------+-------+  LEFT      Compressibility Phasicity Spontaneity Properties Summary  +---------+---------------+---------+-----------+----------+-------+  CFV       Full  Yes       Yes                             +---------+---------------+---------+-----------+----------+-------+  SFJ       Full                                                      +---------+---------------+---------+-----------+----------+-------+  FV Prox   Full                                                      +---------+---------------+---------+-----------+----------+-------+  FV Mid    Full                                                      +---------+---------------+---------+-----------+----------+-------+  FV Distal Full                                                      +---------+---------------+---------+-----------+----------+-------+  PFV       Full                                                      +---------+---------------+---------+-----------+----------+-------+  POP       Full            Yes       Yes                             +---------+---------------+---------+-----------+----------+-------+  PTV       Full                                                      +---------+---------------+---------+-----------+----------+-------+  PERO      Full                                                       +---------+---------------+---------+-----------+----------+-------+     Summary: Right: There is no evidence of deep vein thrombosis in the lower extremity. No cystic structure found in the popliteal fossa. Left: There is no evidence of deep vein thrombosis in the lower extremity. No cystic structure found in the popliteal fossa.  *See table(s) above for measurements and observations. Electronically signed by Coral ElseVance Brabham MD on 06/03/2019 at 12:46:04 PM.    Final  Time spent: 35 minutes  Author: Lynden OxfordPranav Deasia Chiu, MD [facility] 06/03/2019 6:13 PM  To reach On-call, see care teams to locate the attending and reach out to them via [email or web address]. If 7PM-7AM, please contact night-coverage If you still have difficulty reaching the attending provider, please page the [facility] (Director on Call) for [facility] on amion for assistance.

## 2019-06-03 NOTE — Evaluation (Addendum)
Physical Therapy Evaluation and Discharge Patient Details Name: Justin Ball MRN: [id number] DOB: 07/16/1991 Today's Date: 06/03/2019   History of Present Illness  Pt is a [age] y/o male admitted for increased LE swelling and pain. Pt also with Hep C infection. Question noncompliance with medications. PMH includes HIV/AIDS, cryptococcal meningitis, CHF, bipolar disorder, seizure disorder, and s/p VP shunt  Clinical Impression  Patient evaluated by Physical Therapy with no further acute PT needs identified. All education has been completed and the patient has no further questions. Pt overall steady with gait using RW this session. No overt LOB noted. Pt with cognitive deficits at baseline per notes. See below for any follow-up Physical Therapy or equipment needs. PT is signing off. Thank you for this referral.  Of note: Feel pt would benefit from 24/7 supervision at d/c, as notes indicate pt has not been able to care for himself. Per notes, grandmother reports she cannot care for pt. IF pt's grandmother cannot take care of pt, feel he will need CSW consult to determine safe d/c plan. Unsure if pt would qualify for possible group home setting or other setting where he could get assist with medication management, etc?      Follow Up Recommendations No PT follow up;Supervision/Assistance - 24 hour    Equipment Recommendations  Rolling walker with 5" wheels    Recommendations for Other Services Other (comment)(CSW consult )     Precautions / Restrictions Precautions Precautions: None Restrictions Weight Bearing Restrictions: No      Mobility  Bed Mobility Overal bed mobility: Independent                Transfers Overall transfer level: Independent                  Ambulation/Gait Ambulation/Gait assistance: Modified independent (Device/Increase time) Gait Distance (Feet): 300 Feet Assistive device: Rolling walker (2 wheeled) Gait Pattern/deviations: Step-through  pattern;Decreased stride length;Shuffle Gait velocity: WFL    General Gait Details: Shuffle type gait, however, per previous notes, likely baseline. Gait speed WFL. Pt requesting to use RW secondary to LE pain.   Stairs            Wheelchair Mobility    Modified Rankin (Stroke Patients Only)       Balance Overall balance assessment: No apparent balance deficits (not formally assessed)                                           Pertinent Vitals/Pain Pain Assessment: 0-10 Pain Score: 8  Pain Location: BLE  Pain Descriptors / Indicators: Aching;Grimacing;Guarding Pain Intervention(s): Limited activity within patient's tolerance;Monitored during session;Repositioned    Home Living Family/patient expects to be discharged to:: Private residence Living Arrangements: Other relatives(grandmother) Available Help at Discharge: Family;Available 24 hours/day Type of Home: House Home Access: Level entry     Home Layout: One level Home Equipment: None      Prior Function Level of Independence: Independent               Hand Dominance        Extremity/Trunk Assessment   Upper Extremity Assessment Upper Extremity Assessment: Overall WFL for tasks assessed    Lower Extremity Assessment Lower Extremity Assessment: RLE deficits/detail;LLE deficits/detail RLE Deficits / Details: Swelling noted in RLE. Able to perform heel slide and ankle pump.  LLE Deficits / Details: Swelling noted in LLE.  Able to perform heel slide and ankle pump.     Cervical / Trunk Assessment Cervical / Trunk Assessment: Normal  Communication   Communication: No difficulties  Cognition Arousal/Alertness: Awake/alert Behavior During Therapy: WFL for tasks assessed/performed Overall Cognitive Status: History of cognitive impairments - at baseline                                        General Comments General comments (skin integrity, edema, etc.): Per  notes, pt's grandmother states she is unable to take care of pt, however, per pt, he plans to go stay with her. May need CSW consult to determine safe d/c plan.     Exercises     Assessment/Plan    PT Assessment Patent does not need any further PT services  PT Problem List         PT Treatment Interventions      PT Goals (Current goals can be found in the Care Plan section)  Acute Rehab PT Goals Patient Stated Goal: for legs to stop hurting PT Goal Formulation: With patient Time For Goal Achievement: 06/03/19 Potential to Achieve Goals: Good    Frequency     Barriers to discharge        Co-evaluation               AM-PAC PT "6 Clicks" Mobility  Outcome Measure Help needed turning from your back to your side while in a flat bed without using bedrails?: None Help needed moving from lying on your back to sitting on the side of a flat bed without using bedrails?: None Help needed moving to and from a bed to a chair (including a wheelchair)?: None Help needed standing up from a chair using your arms (e.g., wheelchair or bedside chair)?: None Help needed to walk in hospital room?: None Help needed climbing 3-5 steps with a railing? : A Little 6 Click Score: 23    End of Session   Activity Tolerance: Patient tolerated treatment well Patient left: in bed;with call bell/phone within reach Nurse Communication: Mobility status PT Visit Diagnosis: Other abnormalities of gait and mobility (R26.89);Pain Pain - Right/Left: (bilateral) Pain - part of body: Leg    Time: 1610-9604 PT Time Calculation (min) (ACUTE ONLY): 19 min   Charges:   PT Evaluation $PT Eval Low Complexity: [place], PT, DPT  Acute Rehabilitation Services  Pager: [phone] Office: [phone]   Rudean Hitt 06/03/2019, 11:54 AM

## 2019-06-03 NOTE — Progress Notes (Signed)
MD assessment pt bedside and new orders given. Updated pt on plan of care and testing pending. Given omnipaque to drink for CT of abdomen/pelvis.

## 2019-06-03 NOTE — Progress Notes (Signed)
LE venous duplex       has been completed. Preliminary results can be found under CV proc through chart review. Trayden Brandy, BS, RDMS, RVT   

## 2019-06-04 ENCOUNTER — Telehealth: Payer: Self-pay

## 2019-06-04 DIAGNOSIS — R600 Localized edema: Secondary | ICD-10-CM

## 2019-06-04 LAB — COMPREHENSIVE METABOLIC PANEL
ALT: 56 U/L — ABNORMAL HIGH (ref 0–44)
AST: 191 U/L — ABNORMAL HIGH (ref 15–41)
Albumin: 1.7 g/dL — ABNORMAL LOW (ref 3.5–5.0)
Alkaline Phosphatase: 138 U/L — ABNORMAL HIGH (ref 38–126)
Anion gap: 9 (ref 5–15)
BUN: 7 mg/dL (ref 6–20)
CO2: 18 mmol/L — ABNORMAL LOW (ref 22–32)
Calcium: 8.4 mg/dL — ABNORMAL LOW (ref 8.9–10.3)
Chloride: 108 mmol/L (ref 98–111)
Creatinine, Ser: 1.63 mg/dL — ABNORMAL HIGH (ref 0.61–1.24)
GFR calc Af Amer: >60 mL/min (ref >60)
GFR calc non Af Amer: 56 mL/min — ABNORMAL LOW (ref >60)
Glucose, Bld: 84 mg/dL (ref 70–99)
Potassium: 3.3 mmol/L — ABNORMAL LOW (ref 3.5–5.1)
Sodium: 135 mmol/L (ref 135–145)
Total Bilirubin: 9.3 mg/dL — ABNORMAL HIGH (ref 0.3–1.2)
Total Protein: 9.5 g/dL — ABNORMAL HIGH (ref 6.5–8.1)

## 2019-06-04 LAB — CBC WITH DIFFERENTIAL/PLATELET
Abs Immature Granulocytes: 0.03 10*3/uL (ref 0.00–0.07)
Basophils Absolute: 0 10*3/uL (ref 0.0–0.1)
Basophils Relative: 1 %
Eosinophils Absolute: 0 10*3/uL (ref 0.0–0.5)
Eosinophils Relative: 0 %
HCT: 22.4 % — ABNORMAL LOW (ref 39.0–52.0)
Hemoglobin: 7.9 g/dL — ABNORMAL LOW (ref 13.0–17.0)
Immature Granulocytes: 1 %
Lymphocytes Relative: 19 %
Lymphs Abs: 0.8 10*3/uL (ref 0.7–4.0)
MCH: 34.6 pg — ABNORMAL HIGH (ref 26.0–34.0)
MCHC: 35.3 g/dL (ref 30.0–36.0)
MCV: 98.2 fL (ref 80.0–100.0)
Monocytes Absolute: 0.3 10*3/uL (ref 0.1–1.0)
Monocytes Relative: 7 %
Neutro Abs: 3.1 10*3/uL (ref 1.7–7.7)
Neutrophils Relative %: 72 %
Platelets: 99 10*3/uL — ABNORMAL LOW (ref 150–400)
RBC: 2.28 MIL/uL — ABNORMAL LOW (ref 4.22–5.81)
RDW: 18.8 % — ABNORMAL HIGH (ref 11.5–15.5)
WBC: 4.2 10*3/uL (ref 4.0–10.5)
nRBC: 0.5 % — ABNORMAL HIGH (ref 0.0–0.2)

## 2019-06-04 LAB — T-HELPER CELLS (CD4) COUNT (NOT AT ARMC)
CD4 % Helper T Cell: 12 % — ABNORMAL LOW (ref 33–65)
CD4 T Cell Abs: 84 /uL — ABNORMAL LOW (ref 400–1790)

## 2019-06-04 LAB — PROTIME-INR
INR: 2.3 — ABNORMAL HIGH (ref 0.8–1.2)
Prothrombin Time: 25.3 seconds — ABNORMAL HIGH (ref 11.4–15.2)

## 2019-06-04 LAB — LACTIC ACID, PLASMA: Lactic Acid, Venous: 1.9 mmol/L (ref 0.5–1.9)

## 2019-06-04 LAB — RPR, QUANT+TP ABS (REFLEX)
Rapid Plasma Reagin, Quant: 1:1 {titer} — ABNORMAL HIGH
T Pallidum Abs: NONREACTIVE

## 2019-06-04 LAB — MAGNESIUM: Magnesium: 1.8 mg/dL (ref 1.7–2.4)

## 2019-06-04 LAB — RPR: RPR Ser Ql: REACTIVE — AB

## 2019-06-04 MED ORDER — SULFAMETHOXAZOLE-TRIMETHOPRIM 400-80 MG PO TABS
1.0000 | ORAL_TABLET | Freq: Every day | ORAL | Status: DC
  Administered 2019-06-04 – 2019-06-08 (×5): 1 via ORAL
  Filled 2019-06-04 (×5): qty 1

## 2019-06-04 MED ORDER — ONDANSETRON HCL 4 MG/2ML IJ SOLN: 4.0000 mg | Freq: Four times a day (QID) | INTRAMUSCULAR | Status: DC | PRN

## 2019-06-04 NOTE — Progress Notes (Signed)
    [facility]   Reason for visit: Follow up on leg edema  Interval History: GM will be given information for placement after discharge.  No fever.  No new events.   Physical Exam: Constitutional:  Vitals:   06/03/19 2029 06/04/19 0441  BP: (!) 109/47 132/82  Pulse: (!) 102 99  Resp: 18 18  Temp: 98.5 F (36.9 C) 98.4 F (36.9 C)  SpO2: 100% 100%   patient appears in NAD Respiratory: Normal respiratory effort; CTA B Cardiovascular: RRR GI: soft, nt, nd  Review of Systems: Constitutional: negative for fevers and chills Gastrointestinal: negative for diarrhea  Lab Results  Component Value Date   WBC 4.2 06/04/2019   HGB 7.9 (L) 06/04/2019   HCT 22.4 (L) 06/04/2019   MCV 98.2 06/04/2019   PLT 99 (L) 06/04/2019    Lab Results  Component Value Date   CREATININE 1.63 (H) 06/04/2019   BUN 7 06/04/2019   NA 135 06/04/2019   K 3.3 (L) 06/04/2019   CL 108 06/04/2019   CO2 18 (L) 06/04/2019    Lab Results  Component Value Date   ALT 56 (H) 06/04/2019   AST 191 (H) 06/04/2019   ALKPHOS 138 (H) 06/04/2019     Microbiology: Recent Results (from the past 240 hour(s))  SARS Coronavirus 2     Status: None   Collection Time: 06/03/19 12:30 AM  Result Value Ref Range Status   SARS Coronavirus 2 NOT DETECTED NOT DETECTED Final    Comment: (NOTE) SARS-CoV-2 target nucleic acids are NOT DETECTED. The SARS-CoV-2 RNA is generally detectable in upper and lower respiratory specimens during the acute phase of infection.  Negative  results do not preclude SARS-CoV-2 infection, do not rule out co-infections with other pathogens, and should not be used as the sole basis for treatment or other patient management decisions.  Negative results must be combined with clinical observations, patient history, and epidemiological information. The expected result is Not Detected. Fact Sheet for Patients:  http://www.biofiredefense.com/wp-content/uploads/2020/03/BIOFIRE-COVID -19-patients.pdf Fact Sheet for Healthcare Providers: http://www.biofiredefense.com/wp-content/uploads/2020/03/BIOFIRE-COVID -19-hcp.pdf This test is not yet approved or cleared by the [place] and  has been authorized for detection and/or diagnosis of SARS-CoV-2 by FDA under an Emergency Use Authorization (EUA).  This EUA will remain in effec t (meaning this test can be used) for the duration of  the COVID-19 declaration under Section 564(b)(1) of the Act, 21 U.S.C. section 360bbb-3(b)(1), unless the authorization is terminated or revoked sooner. Performed at [facility], [place] [address] [place], [place] [address]     Impression/Plan:  1. Leg edema - possibly to low albumin and replaced.  Will need continued efforts at nutrition 2.  HIV - on appropriate therapy.

## 2019-06-04 NOTE — Telephone Encounter (Signed)
Is not my responsibility but that of the hospitalist taking care of him

## 2019-06-04 NOTE — Progress Notes (Signed)
Patient refuse lactulose-  Reports it makes him vomit

## 2019-06-04 NOTE — Progress Notes (Signed)
[facility] Progress Note  Patient: Justin SpurrLinston R Ball RUE:[id number]   PCP: Daiva EvesVan Dam, Lisette Grinderornelius N, MD DOB: 08/16/1991   DOA: 06/02/2019   DOS: 06/04/2019   Date of Service: the patient was seen and examined on 06/04/2019  Brief hospital course: Pt. with PMH of HIV disease with AIDS who has been noncompliant with medications; admitted on 06/02/2019, presented with complaint of bilateral leg swelling, was found to have decompensated cirrhosis. Currently further plan is continue current care.  Subjective: One episode of vomiting today.  No nausea no vomiting.  No abdominal pain at the time of my evaluation.  Reports bilateral leg pain.  Assessment and Plan: 1 bilateral lower extremity edema Bilateral leg pain.:  Etiology not clear for now. Most likely this is hypoalbuminemia. Patient was given IV albumin. Patient received IV Lasix with worsening renal function therefore we will hold off on further diuresis. Compression stockings as much as can tolerate. Lower extremity Doppler negative for any DVT. Mildly elevated free T4 but do not think that that this is hyperthyroidism. Monitor for now.  2 HIV disease: Appears to be under control follow-up with ID.  Patient currently on TIVICAY and Symtuza.  3 tobacco abuse: Nicotine patch will be added.  4 recent cryptococcal meningitis: Defer to infectious disease regarding treatment.  5 seizure disorder: Continue home regimen and monitor.  6 hepatitis B and C infections: Defer treatment to infectious disease  7 bipolar disorder: Grandmother reported aggressive behavior.  Patient will be monitored closely and continue with home regimen.  8 epistaxis: No active epistaxis at the moment.  Get initial lab work and monitor.  9.  Anemia. Etiology not clear. Likely associated with slow GI bleed. H&H stable.  Hemolysis ruled out.  CT abdomen negative for any intra-abdominal bleeding.  No bleeding reported by the patient here in the  hospital.  10.  Acute kidney injury. Baseline serum creatinine around 1.4.  Presented with serum creatinine of 1.19.  Currently creatinine ranging in 1.63 range.  Need to monitor for stability of renal function before discharging.  11. Concern for cirrhosis. CT abdomen pelvis negative for any cirrhosis or liver. We will discontinue IV ceftriaxone.  12.  Intractable nausea and vomiting. Etiology currently not clear. CT abdomen pelvis negative for any acute abnormality. Patient persistently nauseated and has recurrent episodes of vomiting. We will keep on clear liquid diet. May require to locate his medication to see if there is anything continue modified.  Diet: Cardiac diet DVT Prophylaxis: SCD, pharmacological prophylaxis contraindicated due to Anemia and thrombocytopenia  Advance goals of care discussion: Full code  Family Communication: no family was present at bedside, at the time of interview.   Disposition:  Discharge to Home .  Consultants: Infectious disease Procedures: None  Scheduled Meds:  Darunavir-Cobicisctat-Emtricitabine-Tenofovir Alafenamide  1 tablet Oral Q breakfast   dolutegravir  50 mg Oral Daily   enoxaparin (LOVENOX) injection  40 mg Subcutaneous Q24H   fluconazole  200 mg Oral Daily   lactulose  20 g Oral TID   levETIRAcetam  1,000 mg Oral BID   nicotine  21 mg Transdermal Daily   sulfamethoxazole-trimethoprim  1 tablet Oral Daily   valACYclovir  1,000 mg Oral Q M,W,F   Continuous Infusions:  cefTRIAXone (ROCEPHIN)  IV 1 g (06/04/19 1101)   PRN Meds: acetaminophen, hydrOXYzine, ondansetron (ZOFRAN) IV, ondansetron, oxyCODONE, QUEtiapine Antibiotics: Anti-infectives (From admission, onward)   Start     Dose/Rate Route Frequency Ordered Stop   06/04/19 1000  valACYclovir (VALTREX)  tablet 1,000 mg     1,000 mg Oral Every M-W-F 06/02/19 2311     06/04/19 1000  sulfamethoxazole-trimethoprim (BACTRIM DS) 800-160 MG per tablet 1 tablet   Status:  Discontinued     1 tablet Oral Daily 06/03/19 1120 06/04/19 0904   06/04/19 1000  sulfamethoxazole-trimethoprim (BACTRIM) 400-80 MG per tablet 1 tablet     1 tablet Oral Daily 06/04/19 0904     06/03/19 1300  fluconazole (DIFLUCAN) tablet 200 mg     200 mg Oral Daily 06/03/19 1129     06/03/19 1100  cefTRIAXone (ROCEPHIN) 1 g in sodium chloride 0.9 % 100 mL IVPB     1 g 200 mL/hr over 30 Minutes Intravenous Every 24 hours 06/03/19 1037     06/03/19 1000  dolutegravir (TIVICAY) tablet 50 mg     50 mg Oral Daily 06/02/19 2311     06/03/19 1000  fluconazole (DIFLUCAN) tablet 200 mg  Status:  Discontinued     200 mg Oral Daily 06/02/19 2311 06/02/19 2338   06/03/19 1000  sulfamethoxazole-trimethoprim (BACTRIM DS) 800-160 MG per tablet 1 tablet  Status:  Discontinued     1 tablet Oral Daily 06/02/19 2311 06/03/19 1036   06/03/19 0700  Darunavir-Cobicisctat-Emtricitabine-Tenofovir Alafenamide (SYMTUZA) 800-150-200-10 MG TABS 1 tablet     1 tablet Oral Daily with breakfast 06/02/19 2311         Objective: Physical Exam: Vitals:   06/04/19 0441 06/04/19 1046 06/04/19 1136 06/04/19 1926  BP: 132/82 127/78 (!) 102/57 109/62  Pulse: 99 (!) 109 98 98  Resp: 18  20 20   Temp: 98.4 F (36.9 C)  97.7 F (36.5 C) 97.6 F (36.4 C)  TempSrc: Oral  Oral Oral  SpO2: 100% 100% 100% 100%  Weight:      Height:        Intake/Output Summary (Last 24 hours) at 06/04/2019 1955 Last data filed at 06/04/2019 1300 Gross per 24 hour  Intake 600 ml  Output 1350 ml  Net -750 ml   Filed Weights   06/03/19 0425 06/03/19 0507 06/04/19 0430  Weight: 85.3 kg 87.1 kg 86.4 kg   General: alert and oriented to time, place, and person. Appear in mild distress, affect appropriate Eyes: PERRL, Conjunctiva normal ENT: Oral Mucosa Clear, moist  Neck: no JVD, no Abnormal Mass Or lumps Cardiovascular: S1 and S2 Present, no Murmur, peripheral pulses symmetrical Respiratory: normal respiratory effort,  Bilateral Air entry equal and Decreased, no use of accessory muscle, Clear to Auscultation, no Crackles, no wheezes Abdomen: Bowel Sound present, Soft and no tenderness, no hernia Skin: no rashes  Extremities: bilateral  Pedal edema, bilateral  calf tenderness Neurologic: normal without focal findings, mental status, speech normal, alert and oriented x3, PERLA, Motor strength 5/5 and symmetric and sensation grossly normal to light touch Gait not checked due to patient safety concerns  Data Reviewed: CBC: Recent Labs  Lab 06/03/19 0007 06/03/19 0757 06/04/19 0357  WBC 3.4* 4.1 4.2  NEUTROABS 1.7 2.2 3.1  HGB 8.3* 7.4* 7.9*  HCT 23.4* 20.8* 22.4*  MCV 97.5 98.1 98.2  PLT 102* 111* 99*   Basic Metabolic Panel: Recent Labs  Lab 06/03/19 0007 06/03/19 0757 06/04/19 0357  NA 135 136 135  K 3.2* 3.0* 3.3*  CL 107 107 108  CO2 21* 21* 18*  GLUCOSE 109* 80 84  BUN 5* 6 7  CREATININE 1.49* 1.57* 1.63*  CALCIUM 8.3* 8.5* 8.4*  MG  --  1.5* 1.8  Liver Function Tests: Recent Labs  Lab 06/03/19 0007 06/03/19 0757 06/04/19 0357  AST 199* 178* 191*  ALT 63* 59* 56*  ALKPHOS 165* 157* 138*  BILITOT 10.0* 9.4* 9.3*  PROT 9.5* 8.8* 9.5*  ALBUMIN 1.5* 1.4* 1.7*   No results for input(s): LIPASE, AMYLASE in the last 168 hours. Recent Labs  Lab 06/03/19 0757  AMMONIA 99*   Coagulation Profile: Recent Labs  Lab 06/03/19 0757 06/04/19 0357  INR 2.2* 2.3*   Cardiac Enzymes: No results for input(s): CKTOTAL, CKMB, CKMBINDEX, TROPONINI in the last 168 hours. BNP (last 3 results) No results for input(s): PROBNP in the last 8760 hours. CBG: No results for input(s): GLUCAP in the last 168 hours. Studies: Ct Abdomen Pelvis Wo Contrast  Result Date: 06/03/2019 CLINICAL DATA:  Nausea and vomiting EXAM: CT ABDOMEN AND PELVIS WITHOUT CONTRAST TECHNIQUE: Multidetector CT imaging of the abdomen and pelvis was performed following the standard protocol without IV contrast.  COMPARISON:  01/09/2019 FINDINGS: Lower chest: There is atelectasis at the lung bases. Hepatobiliary: There is cholelithiasis with mild gallbladder wall thickening. The liver is unremarkable Pancreas: Unremarkable. No pancreatic ductal dilatation or surrounding inflammatory changes. Spleen: Normal in size without focal abnormality. Adrenals/Urinary Tract: Adrenal glands are unremarkable. Kidneys are normal, without renal calculi, focal lesion, or hydronephrosis. Bladder is unremarkable. Stomach/Bowel: There is some mild diffuse wall thickening of the distal esophagus. The stomach is otherwise unremarkable. There is no evidence of a small-bowel obstruction. The appendix is normal. The colon is unremarkable. Vascular/Lymphatic: There is no evidence of abdominal aortic aneurysm. There is some mild enlargement of several retroperitoneal lymph nodes. This appears similar to prior study. Reproductive: The prostate gland is mildly enlarged. Other: There is some mild free fluid scattered throughout the abdomen. There is some mesenteric edema. Musculoskeletal: No acute or significant osseous findings. IMPRESSION: 1. No definite acute intra-abdominal abnormality detected. 2. Small amount of free fluid in the abdomen. There is some mild mesenteric edema. 3. Cholelithiasis with mild gallbladder wall thickening. If there is high clinical suspicion for acute cholecystitis, follow-up with ultrasound is recommended. The gallbladder is not distended. Electronically Signed   By: Katherine Mantlehristopher  Green M.D.   On: 06/03/2019 21:31     Time spent: 35 minutes  Author: Lynden OxfordPranav Lavon Horn, MD [facility] 06/04/2019 7:55 PM  To reach On-call, see care teams to locate the attending and reach out to them via [email or web address]. If 7PM-7AM, please contact night-coverage If you still have difficulty reaching the attending provider, please page the [facility] (Director on Call) for [facility] on amion for assistance.

## 2019-06-04 NOTE — Progress Notes (Signed)
Called and talk to Grandmother.  Gave her update on patient (patient gave permission).

## 2019-06-04 NOTE — Plan of Care (Signed)
  Problem: Education: Goal: Knowledge of General Education information will improve Description Including pain rating scale, medication(s)/side effects and non-pharmacologic comfort measures Outcome: Progressing   Problem: Health Behavior/Discharge Planning: Goal: Ability to manage health-related needs will improve Outcome: Progressing   Problem: Clinical Measurements: Goal: Ability to maintain clinical measurements within normal limits will improve Outcome: Progressing Goal: Will remain free from infection Outcome: Progressing Goal: Diagnostic test results will improve Outcome: Progressing Goal: Respiratory complications will improve Outcome: Progressing Goal: Cardiovascular complication will be avoided Outcome: Progressing   Problem: Activity: Goal: Risk for activity intolerance will decrease Outcome: Progressing   Problem: Nutrition: Goal: Adequate nutrition will be maintained Outcome: Progressing   Problem: Coping: Goal: Level of anxiety will decrease Outcome: Progressing   Problem: Elimination: Goal: Will not experience complications related to bowel motility Outcome: Progressing Goal: Will not experience complications related to urinary retention Outcome: Progressing   Problem: Pain Managment: Goal: General experience of comfort will improve Outcome: Progressing   Problem: Safety: Goal: Ability to remain free from injury will improve Outcome: Progressing   Problem: Skin Integrity: Goal: Risk for impaired skin integrity will decrease Outcome: Progressing   Problem: Education: Goal: Individualized Educational Video(s) Outcome: Progressing   Problem: Activity: Goal: Capacity to carry out activities will improve Outcome: Progressing   Problem: Cardiac: Goal: Ability to achieve and maintain adequate cardiopulmonary perfusion will improve Outcome: Progressing   Problem: Education: Goal: Ability to demonstrate management of disease process will  improve Outcome: Progressing Goal: Ability to verbalize understanding of medication therapies will improve Outcome: Progressing Goal: Individualized Educational Video(s) Outcome: Progressing   Problem: Activity: Goal: Capacity to carry out activities will improve Outcome: Progressing   Problem: Cardiac: Goal: Ability to achieve and maintain adequate cardiopulmonary perfusion will improve Outcome: Progressing

## 2019-06-04 NOTE — TOC Initial Note (Signed)
Transition of Care [facility]) - Initial/Assessment Note    Patient Details  Name: Justin Ball MRN: [id number] Date of Birth: 1991/05/03  Transition of Care [facility]) CM/SW Contact:    Candie Chroman, LCSW Phone Number: 06/04/2019, 12:28 PM  Clinical Narrative: Readmission prevention screening complete.  CSW met with patient, introduced role, and explained inquired about interest in facility placement. He confirmed that he and his grandmother had been interested in ALF placement. Explained that we typically do not place from hospital due to amount of time it takes. CSW provided list of [facility] facilities for he and his grandmother to review at home. He has Medicaid so he has a payer source. It's just a matter of who has Medicaid beds. Patient interested in substance abuse treatment resources for alcohol use although he says he only drinks one can of beer 3 times per week. No further concerns.         Expected Discharge Plan: Home/Self Care Barriers to Discharge: Continued Medical Work up   Patient Goals and CMS Choice        Expected Discharge Plan and Services Expected Discharge Plan: Home/Self Care     Post Acute Care Choice: Resumption of Svcs/PTA Provider Living arrangements for the past 2 months: Single Family Home Expected Discharge Date: 06/04/19                                    Prior Living Arrangements/Services Living arrangements for the past 2 months: Single Family Home Lives with:: Other (Comment)(Grandmother) Patient language and need for interpreter reviewed:: Yes(No needs.) Do you feel safe going back to the place where you live?: Yes      Need for Family Participation in Patient Care: Yes (Comment) Care giver support system in place?: Yes (comment)(Grandmother, home health services 4-5 x per week for 2 hours.) Current home services: Other (comment)(Patient could not specify whether RN/aide.) Criminal Activity/Legal Involvement Pertinent to Current  Situation/Hospitalization: No - Comment as needed  Activities of Daily Living Home Assistive Devices/Equipment: None ADL Screening (condition at time of admission) Patient's cognitive ability adequate to safely complete daily activities?: Yes Is the patient deaf or have difficulty hearing?: No Does the patient have difficulty seeing, even when wearing glasses/contacts?: No Does the patient have difficulty concentrating, remembering, or making decisions?: No Patient able to express need for assistance with ADLs?: No Does the patient have difficulty dressing or bathing?: No Independently performs ADLs?: No Communication: Independent Dressing (OT): Independent Grooming: Independent Feeding: Independent Bathing: Independent Toileting: Independent In/Out Bed: Independent Walks in Home: Independent Does the patient have difficulty walking or climbing stairs?: No Weakness of Legs: None Weakness of Arms/Hands: None  Permission Sought/Granted   Permission granted to share information with : No              Emotional Assessment Appearance:: Appears stated age Attitude/Demeanor/Rapport: Engaged, Gracious Affect (typically observed): Accepting, Appropriate, Calm, Pleasant Orientation: : Oriented to Self, Oriented to Place, Oriented to  Time, Oriented to Situation Alcohol / Substance Use: Tobacco Use Psych Involvement: No (comment)  Admission diagnosis:  Volume overload Patient Active Problem List   Diagnosis Date Noted  . Aggressive behavior 06/02/2019  . Acute CHF (congestive heart failure) ([place]) 06/02/2019  . Edema 06/02/2019  . Epistaxis 06/02/2019  . URI (upper respiratory infection) 02/24/2019  . Liver failure ([place]) 01/15/2019  . Alcoholic hepatitis 47/82/9562  . Nausea and vomiting 01/14/2019  .  RUQ pain   . Anxiety   . Jaundice 01/08/2019  . Perianal ulcer ([place]) 11/14/2018  . Hepatitis C virus infection without hepatic coma   . Acute renal failure ([place])   . Non  compliance w medication regimen   . Prolonged Q-T interval on ECG   . Intraabdominal fluid collection   . Seizure disorder ([place]) 09/12/2017  . 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 History of syphilis   . Tobacco abuse   . History of cryptococcal meningitis   . Worsening headaches 05/20/2017  . 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 Hypokalemia 09/12/2016  . Anemia 09/12/2016  . AIDS (acquired immune deficiency syndrome) ([place]) 01/19/2015  . 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 Bipolar disorder ([place]) 04/08/2013   PCP:  Tommy Medal, Lavell Islam, MD Pharmacy:   [place], [place] - [place] [address] Phone: [phone] Fax: [phone]  [facility] [place], [place] - [address] [place] [address] Phone: [phone] Fax: [phone]     Social Determinants of Health (SDOH) Interventions    Readmission Risk Interventions No flowsheet data found.

## 2019-06-04 NOTE — Clinical Social Work Note (Signed)
CSW acknowledges SNF consult. Patient is not appropriate for SNF placement. PT evaluated him yesterday and recommended no PT follow up since he ambulated 300 feet modified independent. We are unable to place patients in group homes from the hospital and typically do not place in ALF due to amount of time it takes. Notified MD of this yesterday. Will call grandmother today and will send ALF list home if she is interested in starting search process.  Justin Ball, [place]

## 2019-06-04 NOTE — Telephone Encounter (Signed)
Patient called office today stating he is currently at [facility]. Patient would like to know if he has any restrictions with his diet. Will route message to Dr. Tommy Medal to advise. [place]

## 2019-06-05 DIAGNOSIS — B180 Chronic viral hepatitis B with delta-agent: Secondary | ICD-10-CM

## 2019-06-05 DIAGNOSIS — K7290 Hepatic failure, unspecified without coma: Secondary | ICD-10-CM

## 2019-06-05 LAB — BASIC METABOLIC PANEL
Anion gap: 7 (ref 5–15)
BUN: 5 mg/dL — ABNORMAL LOW (ref 6–20)
CO2: 21 mmol/L — ABNORMAL LOW (ref 22–32)
Calcium: 8.6 mg/dL — ABNORMAL LOW (ref 8.9–10.3)
Chloride: 106 mmol/L (ref 98–111)
Creatinine, Ser: 1.57 mg/dL — ABNORMAL HIGH (ref 0.61–1.24)
GFR calc Af Amer: >60 mL/min (ref >60)
GFR calc non Af Amer: 59 mL/min — ABNORMAL LOW (ref >60)
Glucose, Bld: 72 mg/dL (ref 70–99)
Potassium: 3 mmol/L — ABNORMAL LOW (ref 3.5–5.1)
Sodium: 134 mmol/L — ABNORMAL LOW (ref 135–145)

## 2019-06-05 LAB — CBC
HCT: 21.9 % — ABNORMAL LOW (ref 39.0–52.0)
Hemoglobin: 7.8 g/dL — ABNORMAL LOW (ref 13.0–17.0)
MCH: 35.3 pg — ABNORMAL HIGH (ref 26.0–34.0)
MCHC: 35.6 g/dL (ref 30.0–36.0)
MCV: 99.1 fL (ref 80.0–100.0)
Platelets: 109 10*3/uL — ABNORMAL LOW (ref 150–400)
RBC: 2.21 MIL/uL — ABNORMAL LOW (ref 4.22–5.81)
RDW: 19.2 % — ABNORMAL HIGH (ref 11.5–15.5)
WBC: 4.6 10*3/uL (ref 4.0–10.5)
nRBC: 0 % (ref 0.0–0.2)

## 2019-06-05 LAB — RPR: RPR Ser Ql: NONREACTIVE

## 2019-06-05 MED ORDER — SULFAMETHOXAZOLE-TRIMETHOPRIM 400-80 MG PO TABS: 1.0000 | ORAL_TABLET | Freq: Every day | ORAL | 3 refills | Status: AC

## 2019-06-05 MED ORDER — POTASSIUM CHLORIDE 10 MEQ/100ML IV SOLN
10.0000 meq | INTRAVENOUS | Status: AC
  Administered 2019-06-05 (×2): 10 meq via INTRAVENOUS
  Filled 2019-06-05: qty 100

## 2019-06-05 MED ORDER — POTASSIUM CHLORIDE 20 MEQ PO PACK
20.0000 meq | PACK | Freq: Once | ORAL | Status: AC
  Administered 2019-06-05: 20 meq via ORAL
  Filled 2019-06-05: qty 1

## 2019-06-05 MED ORDER — POTASSIUM CHLORIDE 10 MEQ/100ML IV SOLN
10.0000 meq | INTRAVENOUS | Status: DC
  Filled 2019-06-05: qty 100

## 2019-06-05 MED FILL — SULFAMETHOXAZOLE-TMP SS TAB: 400-80 | 30 days supply | Qty: 30 | Fill #0

## 2019-06-05 NOTE — Progress Notes (Signed)
Paged MD-  Asked if she would call Grandmother for update.

## 2019-06-05 NOTE — TOC Progression Note (Signed)
Transition of Care [facility]) - Progression Note    Patient Details  Name: Justin Ball MRN: [id number] Date of Birth: 02-04-1991  Transition of Care [facility]) CM/SW [place], LCSW Phone Number: 06/05/2019, 2:00 PM  Clinical Narrative: Called and spoke to patient's grandmother. She indicated that she thinks patient's biggest issue is his "attitude." He had a sitter but they as of two weeks ago they are not coming back to due to patient's "rebelliousness." Patient's grandmother would like for him to receive counseling to try to talk out his issues. She mentioned [facility] but patient is not already a patient there. CSW called main office number. They are making appts for telephonic sessions due to COVID-19 restrictions. Will call and make appt for patient once we know he is discharging. She hopes that he can get in a small group for therapy. Patient's grandmother stated patient is not violent and the last thing she wants to do is put him in a group home.   Expected Discharge Plan: Home/Self Care Barriers to Discharge: Continued Medical Work up  Expected Discharge Plan and Services Expected Discharge Plan: Home/Self Care     Post Acute Care Choice: Resumption of Svcs/PTA Provider Living arrangements for the past 2 months: Single Family Home Expected Discharge Date: 06/04/19                                     Social Determinants of Health (SDOH) Interventions    Readmission Risk Interventions Readmission Risk Prevention Plan 06/04/2019  Transportation Screening Complete  Medication Review Press photographer) Complete  SW Recovery Care/Counseling Consult Complete  [place] Not Applicable  Some recent data might be hidden

## 2019-06-05 NOTE — Progress Notes (Addendum)
PROGRESS NOTE    Justin Ball  ZOX:[id number]  DOB: 05/24/1991  DOA: 06/02/2019 PCP: Randall HissVan Dam, [name], MD  Brief Narrative:  Pt. with PMH of HIV disease with AIDS who has been noncompliant with medications; admitted on 06/02/2019, presented with complaint of bilateral leg swelling, was found to have decompensated cirrhosis. He is admitted to hospitalist service for diuresis with ID consultation. Per ID note patient has HIV, (+) AIDS with recent CD4 just at 200 in March 2020 and VL under good control @ 21 copies. 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 Since this time he has required several admissions for liver related concerns/hepatitis flares. Multiple no shows in the outpatient setting as well with concern over his family's inability to provide him good care.With no significant change in LFTs vs 3 months ago ID resumed Fluconazole 200 mg QD for secondary prophylaxis until his CD4 counts improve and to continue Bactrim single strength QD for OI prophylaxis as well.     Subjective:  Patient has had dyspepsia, intractable nausea/vomiting during the hospital course but slowly improving. He is tolerating clear liquid diet okay.   Objective: Vitals:   06/05/19 0507 06/05/19 1105 06/05/19 1158 06/05/19 2006  BP: 127/74 136/78 140/71 (!) 107/59  Pulse: (!) 109 (!) 113 (!) 115 (!) 103  Resp: 20  18 18   Temp: 98.2 F (36.8 C)  98.4 F (36.9 C) (!) 97.4 F (36.3 C)  TempSrc: Oral  Oral Oral  SpO2: 100% 100% 99% 99%  Weight:      Height:        Intake/Output Summary (Last 24 hours) at 06/05/2019 2020 Last data filed at 06/05/2019 1828 Gross per 24 hour  Intake 908.75 ml  Output 1025 ml  Net -116.25 ml   Filed Weights   06/03/19 0507 06/04/19 0430 06/05/19 0503  Weight: 87.1 kg 86.4 kg 85.8 kg    Physical Examination:  General exam: Appears calm and comfortable  Respiratory system:  Clear to auscultation. Respiratory effort normal. Cardiovascular system: S1 & S2 heard, RRR. No JVD, murmurs, rubs, gallops or clicks. Trace leg/ pedal edema. Gastrointestinal system: Abdomen is nondistended, soft and nontender. No organomegaly or masses felt. Normal bowel sounds heard. Central nervous system: Alert and oriented. No focal neurological deficits. Extremities: Symmetric 5 x 5 power. Skin: No rashes, lesions or ulcers Psychiatry: Judgement and insight appear normal. Mood & affect appropriate.     Data Reviewed: I have personally reviewed following labs and imaging studies  CBC: Recent Labs  Lab 06/03/19 0007 06/03/19 0757 06/04/19 0357 06/05/19 0516  WBC 3.4* 4.1 4.2 4.6  NEUTROABS 1.7 2.2 3.1  --   HGB 8.3* 7.4* 7.9* 7.8*  HCT 23.4* 20.8* 22.4* 21.9*  MCV 97.5 98.1 98.2 99.1  PLT 102* 111* 99* 109*   Basic Metabolic Panel: Recent Labs  Lab 06/03/19 0007 06/03/19 0757 06/04/19 0357 06/05/19 0516  NA 135 136 135 134*  K 3.2* 3.0* 3.3* 3.0*  CL 107 107 108 106  CO2 21* 21* 18* 21*  GLUCOSE 109* 80 84 72  BUN 5* 6 7 5*  CREATININE 1.49* 1.57* 1.63* 1.57*  CALCIUM 8.3* 8.5* 8.4* 8.6*  MG  --  1.5* 1.8  --    GFR: Estimated Creatinine Clearance: 79.2 mL/min (A) (by C-G formula based on SCr of 1.57 mg/dL (H)). Liver Function Tests: Recent Labs  Lab 06/03/19 0007 06/03/19 0757 06/04/19 0357  AST 199* 178* 191*  ALT 63* 59* 56*  ALKPHOS 165* 157* 138*  BILITOT 10.0* 9.4* 9.3*  PROT 9.5* 8.8* 9.5*  ALBUMIN 1.5* 1.4* 1.7*   No results for input(s): LIPASE, AMYLASE in the last 168 hours. Recent Labs  Lab 06/03/19 0757  AMMONIA 99*   Coagulation Profile: Recent Labs  Lab 06/03/19 0757 06/04/19 0357  INR 2.2* 2.3*   Cardiac Enzymes: No results for input(s): CKTOTAL, CKMB, CKMBINDEX, TROPONINI in the last 168 hours. BNP (last 3 results) No results for input(s): PROBNP in the last 8760 hours. HbA1C: No results for input(s): HGBA1C in the last  72 hours. CBG: No results for input(s): GLUCAP in the last 168 hours. Lipid Profile: No results for input(s): CHOL, HDL, LDLCALC, TRIG, CHOLHDL, LDLDIRECT in the last 72 hours. Thyroid Function Tests: Recent Labs    06/03/19 0757  TSH 2.743  FREET4 1.27*   Anemia Panel: Recent Labs    06/03/19 0757  RETICCTPCT 3.1   Sepsis Labs: Recent Labs  Lab 06/04/19 0357  LATICACIDVEN 1.9    Recent Results (from the past 240 hour(s))  SARS Coronavirus 2     Status: None   Collection Time: 06/03/19 12:30 AM  Result Value Ref Range Status   SARS Coronavirus 2 NOT DETECTED NOT DETECTED Final    Comment: (NOTE) SARS-CoV-2 target nucleic acids are NOT DETECTED. The SARS-CoV-2 RNA is generally detectable in upper and lower respiratory specimens during the acute phase of infection.  Negative  results do not preclude SARS-CoV-2 infection, do not rule out co-infections with other pathogens, and should not be used as the sole basis for treatment or other patient management decisions.  Negative results must be combined with clinical observations, patient history, and epidemiological information. The expected result is Not Detected. Fact Sheet for Patients: http://www.biofiredefense.com/wp-content/uploads/2020/03/BIOFIRE-COVID -19-patients.pdf Fact Sheet for Healthcare Providers: http://www.biofiredefense.com/wp-content/uploads/2020/03/BIOFIRE-COVID -19-hcp.pdf This test is not yet approved or cleared by the [place] FDA and  has been authorized for detection and/or diagnosis of SARS-CoV-2 by FDA under an Emergency Use Authorization (EUA).  This EUA will remain in effec t (meaning this test can be used) for the duration of  the COVID-19 declaration under Section 564(b)(1) of the Act, 21 U.S.C. section 360bbb-3(b)(1), unless the authorization is terminated or revoked sooner. Performed at [facility], [address] [place], [place] [address]       Radiology Studies:  Ct Abdomen Pelvis Wo Contrast  Result Date: 06/03/2019 CLINICAL DATA:  Nausea and vomiting EXAM: CT ABDOMEN AND PELVIS WITHOUT CONTRAST TECHNIQUE: Multidetector CT imaging of the abdomen and pelvis was performed following the standard protocol without IV contrast. COMPARISON:  01/09/2019 FINDINGS: Lower chest: There is atelectasis at the lung bases. Hepatobiliary: There is cholelithiasis with mild gallbladder wall thickening. The liver is unremarkable Pancreas: Unremarkable. No pancreatic ductal dilatation or surrounding inflammatory changes. Spleen: Normal in size without focal abnormality. Adrenals/Urinary Tract: Adrenal glands are unremarkable. Kidneys are normal, without renal calculi, focal lesion, or hydronephrosis. Bladder is unremarkable. Stomach/Bowel: There is some mild diffuse wall thickening of the distal esophagus. The stomach is otherwise unremarkable. There is no evidence of a small-bowel obstruction. The appendix is normal. The colon is unremarkable. Vascular/Lymphatic: There is no evidence of abdominal aortic aneurysm. There is some mild enlargement of several retroperitoneal lymph nodes. This appears similar to prior study. Reproductive: The prostate gland is mildly enlarged. Other: There is some mild free fluid scattered throughout the abdomen. There is some mesenteric edema. Musculoskeletal: No acute  or significant osseous findings. IMPRESSION: 1. No definite acute intra-abdominal abnormality detected. 2. Small amount of free fluid in the abdomen. There is some mild mesenteric edema. 3. Cholelithiasis with mild gallbladder wall thickening. If there is high clinical suspicion for acute cholecystitis, follow-up with ultrasound is recommended. The gallbladder is not distended. Electronically Signed   By: Constance Holster M.D.   On: 06/03/2019 21:31        Scheduled Meds: . Darunavir-Cobicisctat-Emtricitabine-Tenofovir Alafenamide  1 tablet Oral Q breakfast  . dolutegravir  50 mg Oral  Daily  . enoxaparin (LOVENOX) injection  40 mg Subcutaneous Q24H  . fluconazole  200 mg Oral Daily  . lactulose  20 g Oral TID  . levETIRAcetam  1,000 mg Oral BID  . nicotine  21 mg Transdermal Daily  . sulfamethoxazole-trimethoprim  1 tablet Oral Daily  . valACYclovir  1,000 mg Oral Q M,W,F   Continuous Infusions:  Assessment & Plan:    1. Leg edema/Decompensated liver cirrhosis: Patient improved with IV albumin/lasix with minimal edema now. IV Lasix held in concern worsening renal function. He does have underlying Hep B/Hep C. Ammonia level elevated on admission at 90 and been lactulose (now refusing due to dyspepsia) Per ID note: "His serum FibroTest indicates cirrhosis, which may be accurate given his ongoing low platelet counts, however with other non-infectious confounders for hepatitis (steatosis, ETOH) difficult to interpret. On Ultrasound in January 2020 he had a normal appearing texture of the liver w/o mass effect. May consider outpatient elastography vs liver biopsy to help with staging as he has multiple factors that could accelerate his fibrosis progression. At this point he may require 6 months of epculsa to treat given his flares and signs of liver decompensation". Lower extremity Doppler negative for any DVT. Last echo in 2440 showed nl systolic/diastic function. Will repeat given elevated BNP on admission.  2.HIV/AIDS with low CD4 counts: Seen by ID. Continue antivirals and prophylactic abx. S/P recent admission for Cryptococcal meningitis  3. AKI: in setting of diuresis. Baseline serum creatinine around 1.4.  Presented with serum creatinine of 1.19 and peaked to 1.63.  Currently off diuretics, creatinine stable around 1.5 -1.6  4. Hep B/ Hep C: Well know to ID clinic as mentioned above. F/U on discharge  5. Polysubstance abuse: Nicotine watch. No signs of alcohol withdrawal  6. Normocytic anemia: Likely AOCD. W/U hemolysis -ve.?Bactrim induced. He did have mild epistaxis  during the hospital course.   7. Dyspepsia : ?Medication induced. Patient relates it to Lactulose and refusing. CT abdomen pelvis negative for any acute abnormality.  N/V improving. Advance diet as tolerated. Watch for signs of enecephalopathy.  8. Bipolar disorder:Grandmother reported aggressive behavior. Patient will be monitored closely and continue with home regimen  9. Seizure disorder:Continue home regimen and monitor   DVT prophylaxis: compression stockings, lovenox Code Status: Full code Family / Patient Communication: Will d/w grandmother in am Disposition Plan: TBD     LOS: 3 days    Time spent: 35 minutes    [place] Shi, MD [facility] Pager [phone]  If 7PM-7AM, please contact night-coverage [email or web address] Password [facility] 06/05/2019, 8:20 PM

## 2019-06-05 NOTE — Progress Notes (Signed)
    [facility] for Infectious Disease   Reason for visit: Follow up on leg edema  Interval History: GM will be given information for placement after discharge.  No fever.  No new events.   Physical Exam: Constitutional:  Vitals:   06/04/19 1926 06/05/19 0507  BP: 109/62 127/74  Pulse: 98 (!) 109  Resp: 20 20  Temp: 97.6 F (36.4 C) 98.2 F (36.8 C)  SpO2: 100% 100%   patient appears in NAD Respiratory: Normal respiratory effort; CTA B Cardiovascular: RRR GI: soft, nt, nd  Review of Systems: Constitutional: negative for fevers and chills Gastrointestinal: negative for diarrhea  Lab Results  Component Value Date   WBC 4.6 06/05/2019   HGB 7.8 (L) 06/05/2019   HCT 21.9 (L) 06/05/2019   MCV 99.1 06/05/2019   PLT 109 (L) 06/05/2019    Lab Results  Component Value Date   CREATININE 1.57 (H) 06/05/2019   BUN 5 (L) 06/05/2019   NA 134 (L) 06/05/2019   K 3.0 (L) 06/05/2019   CL 106 06/05/2019   CO2 21 (L) 06/05/2019    Lab Results  Component Value Date   ALT 56 (H) 06/04/2019   AST 191 (H) 06/04/2019   ALKPHOS 138 (H) 06/04/2019     Microbiology: Recent Results (from the past 240 hour(s))  SARS Coronavirus 2     Status: None   Collection Time: 06/03/19 12:30 AM  Result Value Ref Range Status   SARS Coronavirus 2 NOT DETECTED NOT DETECTED Final    Comment: (NOTE) SARS-CoV-2 target nucleic acids are NOT DETECTED. The SARS-CoV-2 RNA is generally detectable in upper and lower respiratory specimens during the acute phase of infection.  Negative  results do not preclude SARS-CoV-2 infection, do not rule out co-infections with other pathogens, and should not be used as the sole basis for treatment or other patient management decisions.  Negative results must be combined with clinical observations, patient history, and epidemiological information. The expected result is Not Detected. Fact Sheet for Patients:  http://www.biofiredefense.com/wp-content/uploads/2020/03/BIOFIRE-COVID -19-patients.pdf Fact Sheet for Healthcare Providers: http://www.biofiredefense.com/wp-content/uploads/2020/03/BIOFIRE-COVID -19-hcp.pdf This test is not yet approved or cleared by the [place] and  has been authorized for detection and/or diagnosis of SARS-CoV-2 by FDA under an Emergency Use Authorization (EUA).  This EUA will remain in effec t (meaning this test can be used) for the duration of  the COVID-19 declaration under Section 564(b)(1) of the Act, 21 U.S.C. section 360bbb-3(b)(1), unless the authorization is terminated or revoked sooner. Performed at [facility], [place] [address] [place], [place] [address]     Impression/Plan:  1. Leg edema - stable.   2.  HIV - on appropriate therapy.   3.  Opportunistic infection prophylaxis - on bactrim with low CD4 count.   4.  Placement - grandmother has information to help get patient to assisted living or group home.   He has been scheduled with his primary ID provider in 2 weeks.  I will sign off, thanks for taking care of our patient

## 2019-06-05 NOTE — Progress Notes (Signed)
Patient refused lactulose today.   Potassium was low (3.0) called MD-  MD ordered 2 Bags IV and PO supplement.  Patient also wanted diet changed over- MD changed to Full liquid.

## 2019-06-06 ENCOUNTER — Inpatient Hospital Stay (HOSPITAL_COMMUNITY): Payer: Medicaid Other

## 2019-06-06 DIAGNOSIS — I5031 Acute diastolic (congestive) heart failure: Secondary | ICD-10-CM

## 2019-06-06 DIAGNOSIS — K7201 Acute and subacute hepatic failure with coma: Secondary | ICD-10-CM

## 2019-06-06 LAB — AMMONIA: Ammonia: 111 umol/L — ABNORMAL HIGH (ref 9–35)

## 2019-06-06 LAB — BASIC METABOLIC PANEL
Anion gap: 8 (ref 5–15)
BUN: 6 mg/dL (ref 6–20)
CO2: 23 mmol/L (ref 22–32)
Calcium: 8.7 mg/dL — ABNORMAL LOW (ref 8.9–10.3)
Chloride: 104 mmol/L (ref 98–111)
Creatinine, Ser: 1.53 mg/dL — ABNORMAL HIGH (ref 0.61–1.24)
GFR calc Af Amer: >60 mL/min (ref >60)
GFR calc non Af Amer: >60 mL/min (ref >60)
Glucose, Bld: 93 mg/dL (ref 70–99)
Potassium: 2.7 mmol/L — CL (ref 3.5–5.1)
Sodium: 135 mmol/L (ref 135–145)

## 2019-06-06 LAB — ECHOCARDIOGRAM COMPLETE
Height: 73 in
Weight: 2952 oz

## 2019-06-06 LAB — HIV-1 RNA QUANT-NO REFLEX-BLD
HIV 1 RNA Quant: 90 copies/mL
LOG10 HIV-1 RNA: 1.954 log10copy/mL

## 2019-06-06 LAB — POTASSIUM: Potassium: 3.1 mmol/L — ABNORMAL LOW (ref 3.5–5.1)

## 2019-06-06 MED ORDER — POTASSIUM CHLORIDE 10 MEQ/100ML IV SOLN
10.0000 meq | INTRAVENOUS | Status: AC
  Administered 2019-06-06 (×2): 10 meq via INTRAVENOUS
  Filled 2019-06-06 (×2): qty 100

## 2019-06-06 MED ORDER — CYCLOBENZAPRINE HCL 10 MG PO TABS
10.0000 mg | ORAL_TABLET | Freq: Once | ORAL | Status: AC
  Administered 2019-06-07: 10 mg via ORAL
  Filled 2019-06-06 (×2): qty 1

## 2019-06-06 MED ORDER — POTASSIUM CHLORIDE 20 MEQ PO PACK
40.0000 meq | PACK | Freq: Once | ORAL | Status: AC
  Administered 2019-06-06: 40 meq via ORAL
  Filled 2019-06-06: qty 2

## 2019-06-06 NOTE — Progress Notes (Signed)
PROGRESS NOTE    [name]  LZJ:[id number]  DOB: 10-23-1991  DOA: 06/02/2019 PCP: Truman Hayward, MD  Brief Narrative:  Pt. with PMH of HIV disease with AIDS who has been noncompliant with medications; admitted on 06/02/2019, presented with complaint of bilateral leg swelling, was found to have decompensated cirrhosis. He is admitted to hospitalist service for diuresis with ID consultation. Per ID note patient has HIV, (+) AIDS with recent CD4 just at 200 in March 2020 and VL under good control @ 21 copies. 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 Since this time he has required several admissions for liver related concerns/hepatitis flares. Multiple no shows in the outpatient setting as well with concern over his family's inability to provide him good care.With no significant change in LFTs vs 3 months ago ID resumed Fluconazole 200 mg QD for secondary prophylaxis until his CD4 counts improve and to continue Bactrim single strength QD for OI prophylaxis as well.     Subjective:Patient has had dyspepsia, intractable nausea/vomiting during the hospital course but slowly improving.  He has been refusing lactulose since June 10.  He appears to have some day/night confusion.  Nurse reports some agitation last night.  Objective: Vitals:   06/05/19 2006 06/06/19 0632 06/06/19 0641 06/06/19 1210  BP: (!) 107/59 139/82  114/62  Pulse: (!) 103 (!) 102  (!) 106  Resp: 18 20  18   Temp: (!) 97.4 F (36.3 C) (!) 97.5 F (36.4 C)  (!) 97.5 F (36.4 C)  TempSrc: Oral Oral  Oral  SpO2: 99% 100%  99%  Weight:   83.7 kg   Height:        Intake/Output Summary (Last 24 hours) at 06/06/2019 1823 Last data filed at 06/06/2019 1743 Gross per 24 hour  Intake 1040 ml  Output 1750 ml  Net -710 ml   Filed Weights   06/04/19 0430 06/05/19 0503 06/06/19 0641  Weight: 86.4 kg 85.8 kg 83.7 kg    Physical  Examination:  General exam: Appears calm and comfortable  Respiratory system: Clear to auscultation. Respiratory effort normal. Cardiovascular system: S1 & S2 heard, RRR. No JVD, murmurs, rubs, gallops or clicks. Trace leg/ pedal edema. Gastrointestinal system: Abdomen is nondistended, soft and nontender. No organomegaly or masses felt. Normal bowel sounds heard. Central nervous system: Alert and oriented. No focal neurological deficits. Extremities: Symmetric 5 x 5 power. Skin: No rashes, lesions or ulcers Psychiatry: Judgement and insight appear normal. Mood & affect appropriate.     Data Reviewed: I have personally reviewed following labs and imaging studies  CBC: Recent Labs  Lab 06/03/19 0007 06/03/19 0757 06/04/19 0357 06/05/19 0516  WBC 3.4* 4.1 4.2 4.6  NEUTROABS 1.7 2.2 3.1  --   HGB 8.3* 7.4* 7.9* 7.8*  HCT 23.4* 20.8* 22.4* 21.9*  MCV 97.5 98.1 98.2 99.1  PLT 102* 111* 99* 024*   Basic Metabolic Panel: Recent Labs  Lab 06/03/19 0007 06/03/19 0757 06/04/19 0357 06/05/19 0516 06/06/19 1123  NA 135 136 135 134* 135  K 3.2* 3.0* 3.3* 3.0* 2.7*  CL 107 107 108 106 104  CO2 21* 21* 18* 21* 23  GLUCOSE 109* 80 84 72 93  BUN 5* 6 7 5* 6  CREATININE 1.49* 1.57* 1.63* 1.57* 1.53*  CALCIUM 8.3* 8.5* 8.4* 8.6* 8.7*  MG  --  1.5* 1.8  --   --    GFR: Estimated  Creatinine Clearance: 81.2 mL/min (A) (by C-G formula based on SCr of 1.53 mg/dL (H)). Liver Function Tests: Recent Labs  Lab 06/03/19 0007 06/03/19 0757 06/04/19 0357  AST 199* 178* 191*  ALT 63* 59* 56*  ALKPHOS 165* 157* 138*  BILITOT 10.0* 9.4* 9.3*  PROT 9.5* 8.8* 9.5*  ALBUMIN 1.5* 1.4* 1.7*   No results for input(s): LIPASE, AMYLASE in the last 168 hours. Recent Labs  Lab 06/03/19 0757 06/06/19 0614  AMMONIA 99* 111*   Coagulation Profile: Recent Labs  Lab 06/03/19 0757 06/04/19 0357  INR 2.2* 2.3*   Cardiac Enzymes: No results for input(s): CKTOTAL, CKMB, CKMBINDEX, TROPONINI in  the last 168 hours. BNP (last 3 results) No results for input(s): PROBNP in the last 8760 hours. HbA1C: No results for input(s): HGBA1C in the last 72 hours. CBG: No results for input(s): GLUCAP in the last 168 hours. Lipid Profile: No results for input(s): CHOL, HDL, LDLCALC, TRIG, CHOLHDL, LDLDIRECT in the last 72 hours. Thyroid Function Tests: No results for input(s): TSH, T4TOTAL, FREET4, T3FREE, THYROIDAB in the last 72 hours. Anemia Panel: No results for input(s): VITAMINB12, FOLATE, FERRITIN, TIBC, IRON, RETICCTPCT in the last 72 hours. Sepsis Labs: Recent Labs  Lab 06/04/19 0357  LATICACIDVEN 1.9    Recent Results (from the past 240 hour(s))  SARS Coronavirus 2     Status: None   Collection Time: 06/03/19 12:30 AM  Result Value Ref Range Status   SARS Coronavirus 2 NOT DETECTED NOT DETECTED Final    Comment: (NOTE) SARS-CoV-2 target nucleic acids are NOT DETECTED. The SARS-CoV-2 RNA is generally detectable in upper and lower respiratory specimens during the acute phase of infection.  Negative  results do not preclude SARS-CoV-2 infection, do not rule out co-infections with other pathogens, and should not be used as the sole basis for treatment or other patient management decisions.  Negative results must be combined with clinical observations, patient history, and epidemiological information. The expected result is Not Detected. Fact Sheet for Patients: http://www.biofiredefense.com/wp-content/uploads/2020/03/BIOFIRE-COVID -19-patients.pdf Fact Sheet for Healthcare Providers: http://www.biofiredefense.com/wp-content/uploads/2020/03/BIOFIRE-COVID -19-hcp.pdf This test is not yet approved or cleared by the [place] FDA and  has been authorized for detection and/or diagnosis of SARS-CoV-2 by FDA under an Emergency Use Authorization (EUA).  This EUA will remain in effec t (meaning this test can be used) for the duration of  the COVID-19 declaration under Section  564(b)(1) of the Act, 21 U.S.C. section 360bbb-3(b)(1), unless the authorization is terminated or revoked sooner. Performed at [facility], [address] [place], [place] [address]       Radiology Studies: No results found.      Scheduled Meds:  Darunavir-Cobicisctat-Emtricitabine-Tenofovir Alafenamide  1 tablet Oral Q breakfast   dolutegravir  50 mg Oral Daily   enoxaparin (LOVENOX) injection  40 mg Subcutaneous Q24H   fluconazole  200 mg Oral Daily   lactulose  20 g Oral TID   levETIRAcetam  1,000 mg Oral BID   nicotine  21 mg Transdermal Daily   sulfamethoxazole-trimethoprim  1 tablet Oral Daily   valACYclovir  1,000 mg Oral Q M,W,F   Continuous Infusions:  Assessment & Plan:    1. Leg edema/Decompensated liver cirrhosis: Patient improved with IV albumin/lasix with minimal edema now. IV Lasix held in concern worsening renal function. He does have underlying Hep B/Hep C. Ammonia level elevated on admission at 90 and was started on lactulose but patient been refusing due to dyspepsia.he was advised of worsening confusion/hepatic encephalopathy and  he is agreeable to take if mixed in apple juice.  No asterixis on exam and currently oriented x3.  Per ID note: "His serum FibroTest indicates cirrhosis, which may be accurate given his ongoing low platelet counts, however with other non-infectious confounders for hepatitis (steatosis, ETOH) difficult to interpret. On Ultrasound in January 2020 he had a normal appearing texture of the liver w/o mass effect. May consider outpatient elastography vs liver biopsy to help with staging as he has multiple factors that could accelerate his fibrosis progression. At this point he may require 6 months of epculsa to treat given his flares and signs of liver decompensation". Lower extremity Doppler negative for any DVT.  Echocardiogram shows preserved EF.  2.HIV/AIDS with low CD4 counts: Seen by ID. Continue antivirals and  prophylactic abx. S/P recent admission for Cryptococcal meningitis  3. AKI: in setting of diuresis. Baseline serum creatinine around 1.4.  Presented with serum creatinine of 1.19 and peaked to 1.63.  Currently off diuretics (no ascites or leg edema), creatinine stable around 1.5 -1.6  4. Hep B/ Hep C: Well know to ID clinic as mentioned above. F/U on discharge  5. Polysubstance abuse: Nicotine watch. No signs of alcohol withdrawal  6. Normocytic anemia: Likely AOCD. W/U hemolysis -ve.?Bactrim induced. He did have mild epistaxis during the hospital course.   7. Dyspepsia : ?Medication induced. Patient relates it to Lactulose and refusing. CT abdomen pelvis negative for any acute abnormality.  N/V improving. Advance diet as tolerated.  Resume lactulose and monitor for tolerance  8. Bipolar disorder:Grandmother reported aggressive behavior. Patient will be monitored closely and continue with home regimen  9. Seizure disorder:Continue home regimen and monitor   DVT prophylaxis: compression stockings, lovenox Code Status: Full code Family / Patient Communication: d/w patient and grandmother, Fannie KneeSue.  Disposition Plan: Possibly home in am if tolerating diet, stable mental status and cleared by ID as d/w grandmother.      LOS: 4 days    Time spent: 35 minutes    Alessandra BevelsNeelima Wiletta Bermingham, MD [facility] Pager [phone]  If 7PM-7AM, please contact night-coverage [email or web address] Password [email or web address] 06/06/2019, 6:23 PM

## 2019-06-06 NOTE — Progress Notes (Signed)
  Echocardiogram 2D Echocardiogram has been performed.  Justin Ball 06/06/2019, 9:04 AM

## 2019-06-06 NOTE — Plan of Care (Signed)
  Problem: Education: Goal: Knowledge of General Education information will improve Description Including pain rating scale, medication(s)/side effects and non-pharmacologic comfort measures Outcome: Progressing   Problem: Health Behavior/Discharge Planning: Goal: Ability to manage health-related needs will improve Outcome: Progressing   Problem: Clinical Measurements: Goal: Ability to maintain clinical measurements within normal limits will improve Outcome: Progressing Goal: Will remain free from infection Outcome: Progressing Goal: Diagnostic test results will improve Outcome: Progressing Goal: Respiratory complications will improve Outcome: Progressing Goal: Cardiovascular complication will be avoided Outcome: Progressing   Problem: Activity: Goal: Risk for activity intolerance will decrease Outcome: Progressing   Problem: Nutrition: Goal: Adequate nutrition will be maintained Outcome: Progressing   Problem: Coping: Goal: Level of anxiety will decrease Outcome: Progressing   Problem: Elimination: Goal: Will not experience complications related to bowel motility Outcome: Progressing Goal: Will not experience complications related to urinary retention Outcome: Progressing   Problem: Pain Managment: Goal: General experience of comfort will improve Outcome: Progressing   Problem: Safety: Goal: Ability to remain free from injury will improve Outcome: Progressing   Problem: Skin Integrity: Goal: Risk for impaired skin integrity will decrease Outcome: Progressing   Problem: Education: Goal: Individualized Educational Video(s) Outcome: Progressing   Problem: Activity: Goal: Capacity to carry out activities will improve Outcome: Progressing   Problem: Cardiac: Goal: Ability to achieve and maintain adequate cardiopulmonary perfusion will improve Outcome: Progressing   Problem: Education: Goal: Ability to demonstrate management of disease process will  improve Outcome: Progressing Goal: Ability to verbalize understanding of medication therapies will improve Outcome: Progressing Goal: Individualized Educational Video(s) Outcome: Progressing   Problem: Activity: Goal: Capacity to carry out activities will improve Outcome: Progressing   Problem: Cardiac: Goal: Ability to achieve and maintain adequate cardiopulmonary perfusion will improve Outcome: Progressing   

## 2019-06-07 LAB — BASIC METABOLIC PANEL
Anion gap: 8 (ref 5–15)
BUN: 5 mg/dL — ABNORMAL LOW (ref 6–20)
CO2: 22 mmol/L (ref 22–32)
Calcium: 8.2 mg/dL — ABNORMAL LOW (ref 8.9–10.3)
Chloride: 103 mmol/L (ref 98–111)
Creatinine, Ser: 1.62 mg/dL — ABNORMAL HIGH (ref 0.61–1.24)
GFR calc Af Amer: >60 mL/min (ref >60)
GFR calc non Af Amer: 57 mL/min — ABNORMAL LOW (ref >60)
Glucose, Bld: 86 mg/dL (ref 70–99)
Potassium: 3.1 mmol/L — ABNORMAL LOW (ref 3.5–5.1)
Sodium: 133 mmol/L — ABNORMAL LOW (ref 135–145)

## 2019-06-07 LAB — POTASSIUM
Potassium: 2.7 mmol/L — CL (ref 3.5–5.1)
Potassium: 2.7 mmol/L — CL (ref 3.5–5.1)

## 2019-06-07 MED ORDER — POTASSIUM CHLORIDE 10 MEQ/100ML IV SOLN
10.0000 meq | INTRAVENOUS | Status: DC
  Filled 2019-06-07: qty 100

## 2019-06-07 MED ORDER — MAGNESIUM SULFATE 2 GM/50ML IV SOLN
2.0000 g | Freq: Once | INTRAVENOUS | Status: AC
  Administered 2019-06-07: 2 g via INTRAVENOUS
  Filled 2019-06-07: qty 50

## 2019-06-07 MED ORDER — SPIRONOLACTONE 25 MG PO TABS
25.0000 mg | ORAL_TABLET | Freq: Two times a day (BID) | ORAL | Status: DC
  Administered 2019-06-07 – 2019-06-08 (×2): 25 mg via ORAL
  Filled 2019-06-07 (×2): qty 1

## 2019-06-07 MED ORDER — POTASSIUM CHLORIDE 20 MEQ PO PACK: 20.0000 meq | PACK | Freq: Once | ORAL | Status: DC

## 2019-06-07 MED ORDER — POTASSIUM CHLORIDE 20 MEQ PO PACK
40.0000 meq | PACK | Freq: Once | ORAL | Status: AC
  Administered 2019-06-07: 40 meq via ORAL
  Filled 2019-06-07 (×2): qty 2

## 2019-06-07 MED ORDER — POTASSIUM CHLORIDE 10 MEQ/100ML IV SOLN
10.0000 meq | INTRAVENOUS | Status: AC
  Administered 2019-06-07 (×3): 10 meq via INTRAVENOUS
  Filled 2019-06-07 (×2): qty 100

## 2019-06-07 NOTE — Progress Notes (Signed)
PROGRESS NOTE    Justin Ball  ZOX:[id number]  DOB: 12/07/1991  DOA: 06/02/2019 PCP: Randall HissVan Dam, [name], MD  Brief Narrative:  Pt. with PMH of HIV disease with AIDS who has been noncompliant with medications; admitted on 06/02/2019, presented with complaint of bilateral leg swelling, was found to have decompensated cirrhosis. He is admitted to hospitalist service for diuresis with ID consultation. Per ID note patient has HIV, (+) AIDS with recent CD4 just at 200 in March 2020 and VL under good control @ 21 copies. 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 Since this time he has required several admissions for liver related concerns/hepatitis flares. Multiple no shows in the outpatient setting as well with concern over his family's inability to provide him good care.With no significant change in LFTs vs 3 months ago ID resumed Fluconazole 200 mg QD for secondary prophylaxis until his CD4 counts improve and to continue Bactrim single strength QD for OI prophylaxis as well.     Subjective: Patient tolerating diet well.  He is also tolerating lactulose with applesauce.  Seen by physical therapy and recommended discharge home with 24-hour supervision.  Labs however show severe hypokalemia.  Objective: Vitals:   06/06/19 2143 06/07/19 0200 06/07/19 0504 06/07/19 1208  BP: 116/64  107/81 115/82  Pulse: 98  100 (!) 101  Resp: 20  20 18   Temp: 98.1 F (36.7 C)  (!) 97.4 F (36.3 C) 97.9 F (36.6 C)  TempSrc: Oral  Oral Oral  SpO2: 98%  100% 100%  Weight:  81.6 kg    Height:        Intake/Output Summary (Last 24 hours) at 06/07/2019 1805 Last data filed at 06/07/2019 0201 Gross per 24 hour  Intake 240 ml  Output -  Net 240 ml   Filed Weights   06/05/19 0503 06/06/19 0641 06/07/19 0200  Weight: 85.8 kg 83.7 kg 81.6 kg    Physical Examination:  General exam: Appears calm and comfortable   Respiratory system: Clear to auscultation. Respiratory effort normal. Cardiovascular system: S1 & S2 heard, RRR. No JVD, murmurs, rubs, gallops or clicks. Trace leg/ pedal edema. Gastrointestinal system: Abdomen is nondistended, soft and nontender. No organomegaly or masses felt. Normal bowel sounds heard. Central nervous system: Alert and oriented. No focal neurological deficits. Extremities: Symmetric 5 x 5 power. Skin: No rashes, lesions or ulcers Psychiatry: Judgement and insight appear normal. Mood & affect appropriate.     Data Reviewed: I have personally reviewed following labs and imaging studies  CBC: Recent Labs  Lab 06/03/19 0007 06/03/19 0757 06/04/19 0357 06/05/19 0516  WBC 3.4* 4.1 4.2 4.6  NEUTROABS 1.7 2.2 3.1  --   HGB 8.3* 7.4* 7.9* 7.8*  HCT 23.4* 20.8* 22.4* 21.9*  MCV 97.5 98.1 98.2 99.1  PLT 102* 111* 99* 109*   Basic Metabolic Panel: Recent Labs  Lab 06/03/19 0007 06/03/19 0757 06/04/19 0357 06/05/19 0516 06/06/19 1123 06/06/19 2014 06/07/19 1124 06/07/19 1701  NA 135 136 135 134* 135  --   --   --   K 3.2* 3.0* 3.3* 3.0* 2.7* 3.1* 2.7* 2.7*  CL 107 107 108 106 104  --   --   --   CO2 21* 21* 18* 21* 23  --   --   --   GLUCOSE 109* 80 84 72 93  --   --   --   BUN 5* 6 7  5* 6  --   --   --   CREATININE 1.49* 1.57* 1.63* 1.57* 1.53*  --   --   --   CALCIUM 8.3* 8.5* 8.4* 8.6* 8.7*  --   --   --   MG  --  1.5* 1.8  --   --   --   --   --    GFR: Estimated Creatinine Clearance: 81.2 mL/min (A) (by C-G formula based on SCr of 1.53 mg/dL (H)). Liver Function Tests: Recent Labs  Lab 06/03/19 0007 06/03/19 0757 06/04/19 0357  AST 199* 178* 191*  ALT 63* 59* 56*  ALKPHOS 165* 157* 138*  BILITOT 10.0* 9.4* 9.3*  PROT 9.5* 8.8* 9.5*  ALBUMIN 1.5* 1.4* 1.7*   No results for input(s): LIPASE, AMYLASE in the last 168 hours. Recent Labs  Lab 06/03/19 0757 06/06/19 0614  AMMONIA 99* 111*   Coagulation Profile: Recent Labs  Lab 06/03/19  0757 06/04/19 0357  INR 2.2* 2.3*   Cardiac Enzymes: No results for input(s): CKTOTAL, CKMB, CKMBINDEX, TROPONINI in the last 168 hours. BNP (last 3 results) No results for input(s): PROBNP in the last 8760 hours. HbA1C: No results for input(s): HGBA1C in the last 72 hours. CBG: No results for input(s): GLUCAP in the last 168 hours. Lipid Profile: No results for input(s): CHOL, HDL, LDLCALC, TRIG, CHOLHDL, LDLDIRECT in the last 72 hours. Thyroid Function Tests: No results for input(s): TSH, T4TOTAL, FREET4, T3FREE, THYROIDAB in the last 72 hours. Anemia Panel: No results for input(s): VITAMINB12, FOLATE, FERRITIN, TIBC, IRON, RETICCTPCT in the last 72 hours. Sepsis Labs: Recent Labs  Lab 06/04/19 0357  LATICACIDVEN 1.9    Recent Results (from the past 240 hour(s))  SARS Coronavirus 2     Status: None   Collection Time: 06/03/19 12:30 AM  Result Value Ref Range Status   SARS Coronavirus 2 NOT DETECTED NOT DETECTED Final    Comment: (NOTE) SARS-CoV-2 target nucleic acids are NOT DETECTED. The SARS-CoV-2 RNA is generally detectable in upper and lower respiratory specimens during the acute phase of infection.  Negative  results do not preclude SARS-CoV-2 infection, do not rule out co-infections with other pathogens, and should not be used as the sole basis for treatment or other patient management decisions.  Negative results must be combined with clinical observations, patient history, and epidemiological information. The expected result is Not Detected. Fact Sheet for Patients: http://www.biofiredefense.com/wp-content/uploads/2020/03/BIOFIRE-COVID -19-patients.pdf Fact Sheet for Healthcare Providers: http://www.biofiredefense.com/wp-content/uploads/2020/03/BIOFIRE-COVID -19-hcp.pdf This test is not yet approved or cleared by the [place] and  has been authorized for detection and/or diagnosis of SARS-CoV-2 by FDA under an Emergency Use Authorization (EUA).   This EUA will remain in effec t (meaning this test can be used) for the duration of  the COVID-19 declaration under Section 564(b)(1) of the Act, 21 U.S.C. section 360bbb-3(b)(1), unless the authorization is terminated or revoked sooner. Performed at [facility], [place] [address] [place], [place] [address]       Radiology Studies: No results found.      Scheduled Meds: . Darunavir-Cobicisctat-Emtricitabine-Tenofovir Alafenamide  1 tablet Oral Q breakfast  . dolutegravir  50 mg Oral Daily  . enoxaparin (LOVENOX) injection  40 mg Subcutaneous Q24H  . fluconazole  200 mg Oral Daily  . lactulose  20 g Oral TID  . levETIRAcetam  1,000 mg Oral BID  . nicotine  21 mg Transdermal Daily  . potassium chloride  20 mEq Oral Once  . potassium chloride  40 mEq Oral Once  . spironolactone  25 mg Oral BID  . sulfamethoxazole-trimethoprim  1 tablet Oral Daily  . valACYclovir  1,000 mg Oral Q M,W,F   Continuous Infusions: . magnesium sulfate bolus IVPB    . potassium chloride 10 mEq (06/07/19 1802)    Assessment & Plan:    1. Leg edema/Decompensated liver cirrhosis: Patient improved with IV albumin/lasix with minimal edema now. IV Lasix held in concern worsening renal function. He does have underlying Hep B/Hep C. Ammonia level elevated on admission at 90 and was started on lactulose but patient been refusing due to dyspepsia.he was advised of worsening confusion/hepatic encephalopathy and he is agreeable to take if mixed in apple juice.  No asterixis on exam and currently oriented x3.  Will add Aldactone for maintenance therapy upon discharge (might help potassium as well)  2.HIV/AIDS with low CD4 counts: Seen by ID. Continue antivirals and prophylactic abx. S/P recent admission for Cryptococcal meningitis  3. AKI: in setting of diuresis. Baseline serum creatinine around 1.4.  Presented with serum creatinine of 1.19 and peaked to 1.63.  Currently off diuretics (no ascites or leg  edema), creatinine stable around 1.5 -1.6  4. Hep B/ Hep C: Well know to ID clinic as mentioned above. F/U on discharge  5. Polysubstance abuse: Nicotine watch. No signs of alcohol withdrawal  6. Normocytic anemia: Likely AOCD. W/U hemolysis -ve.?Bactrim induced. He did have mild epistaxis during the hospital course.   7. Dyspepsia : ?Medication induced. Patient relates it to Lactulose and refusing. CT abdomen pelvis negative for any acute abnormality.  N/V improving. Advance diet as tolerated.  Resume lactulose and monitor for tolerance  8. Bipolar disorder:Grandmother reported aggressive behavior. Patient will be monitored closely and continue with home regimen  9. Seizure disorder:Continue home regimen and monitor  10.  Severe persistent hypokalemia: Ordered multiple rounds of potassium (IV and oral) given plan for discharge today but repeat potassium level still low at 2.7.  Patient did not get adequate IV access until late.  Will repeat level again at 9 PM prior to further replacement.  Last magnesium level 1.8.  Will repeat lab in a.m. after 1 dose IV mag   DVT prophylaxis: compression stockings, lovenox Code Status: Full code Family / Patient Communication: d/w patient Disposition Plan: Likely home in am if potassium level reasonably improved.    LOS: 5 days    Time spent: 35 minutes    Alessandra BevelsNeelima Payzlee Ryder, MD [facility] Pager [phone]  If 7PM-7AM, please contact night-coverage [email or web address] Password [email or web address] 06/07/2019, 6:05 PM

## 2019-06-07 NOTE — Plan of Care (Signed)
  Problem: Education: Goal: Knowledge of General Education information will improve Description Including pain rating scale, medication(s)/side effects and non-pharmacologic comfort measures Outcome: Progressing   Problem: Health Behavior/Discharge Planning: Goal: Ability to manage health-related needs will improve Outcome: Progressing   Problem: Clinical Measurements: Goal: Ability to maintain clinical measurements within normal limits will improve Outcome: Progressing Goal: Will remain free from infection Outcome: Progressing Goal: Diagnostic test results will improve Outcome: Progressing Goal: Respiratory complications will improve Outcome: Progressing Goal: Cardiovascular complication will be avoided Outcome: Progressing   Problem: Activity: Goal: Risk for activity intolerance will decrease Outcome: Progressing   Problem: Nutrition: Goal: Adequate nutrition will be maintained Outcome: Progressing   Problem: Coping: Goal: Level of anxiety will decrease Outcome: Progressing   Problem: Elimination: Goal: Will not experience complications related to bowel motility Outcome: Progressing Goal: Will not experience complications related to urinary retention Outcome: Progressing   Problem: Pain Managment: Goal: General experience of comfort will improve Outcome: Progressing   Problem: Safety: Goal: Ability to remain free from injury will improve Outcome: Progressing   Problem: Skin Integrity: Goal: Risk for impaired skin integrity will decrease Outcome: Progressing   Problem: Education: Goal: Individualized Educational Video(s) Outcome: Progressing   Problem: Activity: Goal: Capacity to carry out activities will improve Outcome: Progressing   Problem: Cardiac: Goal: Ability to achieve and maintain adequate cardiopulmonary perfusion will improve Outcome: Progressing   Problem: Education: Goal: Ability to demonstrate management of disease process will  improve Outcome: Progressing Goal: Ability to verbalize understanding of medication therapies will improve Outcome: Progressing Goal: Individualized Educational Video(s) Outcome: Progressing   Problem: Activity: Goal: Capacity to carry out activities will improve Outcome: Progressing   Problem: Cardiac: Goal: Ability to achieve and maintain adequate cardiopulmonary perfusion will improve Outcome: Progressing   

## 2019-06-07 NOTE — Plan of Care (Signed)
  Problem: Education: Goal: Knowledge of General Education information will improve Description Including pain rating scale, medication(s)/side effects and non-pharmacologic comfort measures Outcome: Progressing   Problem: Health Behavior/Discharge Planning: Goal: Ability to manage health-related needs will improve Outcome: Progressing   Problem: Clinical Measurements: Goal: Will remain free from infection Outcome: Progressing Goal: Respiratory complications will improve Outcome: Progressing   Problem: Activity: Goal: Risk for activity intolerance will decrease Outcome: Progressing   Problem: Elimination: Goal: Will not experience complications related to bowel motility Outcome: Progressing Goal: Will not experience complications related to urinary retention Outcome: Progressing   

## 2019-06-07 NOTE — Evaluation (Signed)
Physical Therapy Evaluation/ Discharge Patient Details Name: Justin Ball MRN: [id number] DOB: 04/08/1991 Today's Date: 06/07/2019   History of Present Illness  Pt is a 28 y/o male admitted for increased LE swelling and pain. Pt also with Hep C infection. Question noncompliance with medications. PMH includes HIV/AIDS, cryptococcal meningitis, CHF, bipolar disorder, seizure disorder, and s/p VP shunt  Clinical Impression  Pt pleasant standing in his room in his boxers and no socks on arrival. Pt able to don gown and socks, perform all basic transfers and gait without assist without LOB. Pt with cognitive deficits for processing and memory who would benefit from 24hr supervision for safety but no further therapy needs at this time. Pt was evaluated on 06/03/19 with need for RW at at this time independent without even need for DME. Will sign off with pt aware and agreeable. PT does report he is looking into ALF.     Follow Up Recommendations No PT follow up;Supervision/Assistance - 24 hour    Equipment Recommendations  None recommended by PT    Recommendations for Other Services       Precautions / Restrictions Precautions Precautions: None      Mobility  Bed Mobility Overal bed mobility: Independent                Transfers Overall transfer level: Independent                  Ambulation/Gait Ambulation/Gait assistance: Independent Gait Distance (Feet): 400 Feet Assistive device: None Gait Pattern/deviations: WFL(Within Functional Limits)   Gait velocity interpretation: >4.37 ft/sec, indicative of normal walking speed General Gait Details: pt with normal gait with ability to perform head turns, change of direction  Stairs            Wheelchair Mobility    Modified Rankin (Stroke Patients Only)       Balance Overall balance assessment: No apparent balance deficits (not formally assessed)                                            Pertinent Vitals/Pain Pain Score: 3  Pain Location: bil LE Pain Descriptors / Indicators: Sore Pain Intervention(s): Limited activity within patient's tolerance    Home Living Family/patient expects to be discharged to:: Private residence Living Arrangements: Other relatives Available Help at Discharge: Family;Available 24 hours/day Type of Home: House Home Access: Level entry     Home Layout: One level Home Equipment: None      Prior Function Level of Independence: Independent         Comments: does some cooking with grandmother supervision     Hand Dominance        Extremity/Trunk Assessment   Upper Extremity Assessment Upper Extremity Assessment: Overall WFL for tasks assessed    Lower Extremity Assessment Lower Extremity Assessment: Overall WFL for tasks assessed RLE Deficits / Details: bil LE edema LLE Deficits / Details: bil LE edema    Cervical / Trunk Assessment Cervical / Trunk Assessment: Normal  Communication   Communication: No difficulties  Cognition Arousal/Alertness: Awake/alert Behavior During Therapy: WFL for tasks assessed/performed Overall Cognitive Status: No family/caregiver present to determine baseline cognitive functioning                                 General Comments: pt with  history of cognitive deficits; oriented to person, place, month and year but not day; unable to recall room number; able to provide accurate response in event of a fire      General Comments      Exercises     Assessment/Plan    PT Assessment Patent does not need any further PT services  PT Problem List         PT Treatment Interventions      PT Goals (Current goals can be found in the Care Plan section)  Acute Rehab PT Goals PT Goal Formulation: All assessment and education complete, DC therapy    Frequency     Barriers to discharge        Co-evaluation               AM-PAC PT "6 Clicks" Mobility  Outcome  Measure Help needed turning from your back to your side while in a flat bed without using bedrails?: None Help needed moving from lying on your back to sitting on the side of a flat bed without using bedrails?: None Help needed moving to and from a bed to a chair (including a wheelchair)?: None Help needed standing up from a chair using your arms (e.g., wheelchair or bedside chair)?: None Help needed to walk in hospital room?: None Help needed climbing 3-5 steps with a railing? : None 6 Click Score: 24    End of Session   Activity Tolerance: Patient tolerated treatment well Patient left: in bed;with call bell/phone within reach Nurse Communication: Mobility status PT Visit Diagnosis: Other abnormalities of gait and mobility (R26.89);Pain    Time: 2229-7989 PT Time Calculation (min) (ACUTE ONLY): 16 min   Charges:   PT Evaluation $PT Eval Low Complexity: [place], PT Acute Rehabilitation Services Pager: [phone] Office: [place] 06/07/2019, 12:49 PM

## 2019-06-07 NOTE — Discharge Summary (Addendum)
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 He was admitted to hospitalist service for diuresis with ID consultation. Per ID note patient has HIV, (+) AIDS with recent CD4 just at 200 in March 2020 and VL under good control @ 21 copies. 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 Since this time he has required several admissions for liver related concerns/hepatitis flares. Multiple no shows in the outpatient setting as well with concern over his family's inability to provide him good care.With no significant change in LFTs vs 3 months ago ID resumed Fluconazole 200 mg QD for secondary prophylaxis until his CD4 counts improve and to continue Bactrim single strength QD for OI prophylaxis as well.  During the hospital course he also reported dyspepsia and was intermittently refusing lactulose.  1. Leg edema/Decompensated liver cirrhosis: Patient improved with IV albumin/lasix  with resolved edema now.IV diuretics were subsequently held in concern for AKI and hyponatremia.  Will prescribe Aldactone at the time of discharge with instructions to take additional Lasix only as needed.  He does have underlying Hep B/Hep C. Ammonia level elevated on admission at 90 and was started on lactulose but patient was refusing due to dyspepsia. He was advised of worsening confusion/hepatic encephalopathy and he agreed to take if mixed in apple juice (tolerating well).  No asterixis on exam and currently oriented x3.  Lower extremity Doppler negative for any DVT.  Echocardiogram shows preserved EF.  Per ID note: "His serum FibroTest indicates cirrhosis, which may be accurate given his ongoing low platelet counts, however with other non-infectious confounders for hepatitis (steatosis, ETOH) difficult to interpret. On Ultrasound in January 2020 he had a normal appearing texture of the liver w/o mass effect. May consider outpatient elastography vs liver biopsy to help with staging as he has multiple factors that could accelerate his fibrosis progression. At this point he may require 6 months of epculsa to treat given his flares and signs of liver decompensation".   2.HIV/AIDS with low CD4 counts: Seen by ID. Continue antivirals and prophylactic abx. S/P recent admission for Cryptococcal meningitis.  Follow-up ID clinic upon discharge  3. AKI on CKD stage 2 : in setting of diuresis. Baseline serum creatinine around 1.4. Presented with serum creatinine of 1.19 and peaked to 1.63 IV diuretics.Currently off diuretics (no ascites or leg edema), creatinine stable around 1.5 -1.6.  Will discharge on Aldactone and advise to take Lasix only as needed.  4.  Severe hypokalemia: Patient had potassium level of 3.2 on presentation this time and had further lowering  in the setting of IV diuresis to potassium levels of 2.7.  He received multiple rounds of IV and oral potassium.  His magnesium level was 1.8 but  received additional replacement for refractory hypokalemia.  He remained hypokalemic even after discontinuation of diuretics.  Could be related to GI losses/malabsorption /chronic renal wasting as it appears that patient has had hypokalemia previously as well.  He also tends to refuse po meds.. Plan to  discharge patient on potassium sparing diuretics/Aldactone for problem #1 with 20 mEq potassium and 200 mg magnesium daily.  He should have labs in 3-4 days to ensure adequate levels.Will d/w grandmother to ensure compliance.   5.Dyspepsia : ?Medication induced. Improved now. Tolerating lactulose and potassium supplements when given in apple sauce. CT abdomen pelvis negative for any acute abnormality. Advanced diet to regular as tolerated  6. Normocytic anemia: Likely AOCD. W/U hemolysis -ve.?Bactrim induced. He also had mild epistaxis during the hospital course.   7. Hep B/ Hep C: Well known to ID clinic as mentioned above. F/U on discharge  8. Bipolar disorder:Grandmother reports intermittent anger bursts and aggressive behavior. Patient will be monitored closely and will be resumed on home regimen  9. Seizure disorder:Continue home regimen and monitor  10. Polysubstance abuse: Nicotine watch. No signs of alcohol withdrawal  10.  Disposition: Initially grandmother wanted to explore short-term rehab or group home placement but now she prefers to take him home with outpatient psychiatric/substance abuse center follow-up through [facility].  Seen by physical therapy who did not feel he had any rehab needs and recommended discharge home with 24-hour supervision. I called grandmother- Fannie KneeSue to see if she can pick up today if labs normalize. No answer. Left a message.   Discharge Exam: Vitals:   06/08/19 0326 06/08/19 1514  BP: 114/63 (!) 112/55  Pulse: 94 (!) 105  Resp: 19 18  Temp: 98.3 F (36.8 C) 98.5 F (36.9 C)  SpO2: 100% 93%   Vitals:   06/07/19 1208 06/07/19 2105 06/08/19 0326  06/08/19 1514  BP: 115/82 111/71 114/63 (!) 112/55  Pulse: (!) 101 97 94 (!) 105  Resp: 18 16 19 18   Temp: 97.9 F (36.6 C) (!) 97.4 F (36.3 C) 98.3 F (36.8 C) 98.5 F (36.9 C)  TempSrc: Oral Oral Oral Oral  SpO2: 100% 100% 100% 93%  Weight:      Height:        General: Pt is alert, awake, not in acute distress.  Ambulating comfortably in his room. Cardiovascular: RRR, S1/S2 +, no rubs, no gallops Respiratory: CTA bilaterally, no wheezing, no rhonchi Abdominal: Soft, NT, ND, bowel sounds + Extremities: no edema, no cyanosis  Discharge Instructions  Discharge Instructions    Call MD for:   Complete by: As directed    Worsening leg swellings, shortness of breath, palpitations   Diet - low sodium heart healthy   Complete by: As directed    Increase activity slowly   Complete by: As directed      Allergies as of 06/08/2019      Reactions   Acyclovir And Related Itching      Medication List    STOP taking these medications   acetaminophen 325 MG tablet Commonly known as: TYLENOL   sulfamethoxazole-trimethoprim 800-160 MG tablet Commonly known as: BACTRIM DS Replaced by: sulfamethoxazole-trimethoprim 400-80 MG tablet     TAKE these medications   Darunavir-Cobicisctat-Emtricitabine-Tenofovir Alafenamide 800-150-200-10 MG Tabs Commonly known as: Symtuza Take 1 tablet by mouth daily with breakfast.  dolutegravir 50 MG tablet Commonly known as: TIVICAY Take 1 tablet (50 mg total) by mouth daily.   fluconazole 200 MG tablet Commonly known as: DIFLUCAN Take 1 tablet (200 mg total) by mouth daily.   furosemide 20 MG tablet Commonly known as: Lasix Take 1 tablet (20 mg total) by mouth daily as needed for fluid or edema (leg swellings). Take extra dose of potassium if you take this medication   lactulose 10 GM/15ML solution Commonly known as: CHRONULAC Take 30 mLs (20 g total) by mouth 3 (three) times daily.   levETIRAcetam 1000 MG tablet Commonly known as:  KEPPRA Take 1 tablet (1,000 mg total) by mouth 2 (two) times daily.   Magnesium Oxide 200 MG Tabs Take 1 tablet (200 mg total) by mouth daily.   ondansetron 4 MG tablet Commonly known as: Zofran Take 1 tablet (4 mg total) by mouth every 6 (six) hours as needed for nausea or vomiting.   Potassium Chloride ER 20 MEQ Tbcr Take 20 mEq by mouth daily for 15 days.   QUEtiapine 25 MG tablet Commonly known as: SEROQUEL Take 1 tablet (25 mg total) by mouth 2 (two) times daily as needed (agitation).   spironolactone 25 MG tablet Commonly known as: ALDACTONE Take 1 tablet (25 mg total) by mouth 2 (two) times daily.   sulfamethoxazole-trimethoprim 400-80 MG tablet Commonly known as: BACTRIM Take 1 tablet by mouth daily. Replaces: sulfamethoxazole-trimethoprim 800-160 MG tablet   valACYclovir 1000 MG tablet Commonly known as: VALTREX Take 1 tablet (1,000 mg total) by mouth every Monday, Wednesday, and Friday. Start taking on: June 09, 2019       Allergies  Allergen Reactions  . Acyclovir And Related Itching       Discharge Condition: Stable CODE STATUS: Full code Diet recommendation: 2 g sodium diet Recommendations for Outpatient Follow-up:  1. Follow up with PCP (states see Dr [name] for primary care)  in 3-5 days 2. Please obtain BMP for renal function and potassium level in 3 days 3.  Please follow up with infectious diseases clinic in 2 weeks        The results of significant diagnostics from this hospitalization (including imaging, microbiology, ancillary and laboratory) are listed below for reference.     Microbiology: Recent Results (from the past 240 hour(s))  SARS Coronavirus 2     Status: None   Collection Time: 06/03/19 12:30 AM  Result Value Ref Range Status   SARS Coronavirus 2 NOT DETECTED NOT DETECTED Final    Comment: (NOTE) SARS-CoV-2 target nucleic acids are NOT DETECTED. The SARS-CoV-2 RNA is generally detectable in upper and lower respiratory  specimens during the acute phase of infection.  Negative  results do not preclude SARS-CoV-2 infection, do not rule out co-infections with other pathogens, and should not be used as the sole basis for treatment or other patient management decisions.  Negative results must be combined with clinical observations, patient history, and epidemiological information. The expected result is Not Detected. Fact Sheet for Patients: http://www.biofiredefense.com/wp-content/uploads/2020/03/BIOFIRE-COVID -19-patients.pdf Fact Sheet for Healthcare Providers: http://www.biofiredefense.com/wp-content/uploads/2020/03/BIOFIRE-COVID -19-hcp.pdf This test is not yet approved or cleared by the [place] and  has been authorized for detection and/or diagnosis of SARS-CoV-2 by FDA under an Emergency Use Authorization (EUA).  This EUA will remain in effec t (meaning this test can be used) for the duration of  the COVID-19 declaration under Section 564(b)(1) of the Act, 21 U.S.C. section 360bbb-3(b)(1), unless the authorization is terminated or revoked sooner. Performed at  [facility], [address] [place], [place] [address]      Labs: BNP (last 3 results) Recent Labs    06/03/19 0757  BNP 129.1*   Basic Metabolic Panel: Recent Labs  Lab 06/03/19 0757 06/04/19 0357 06/05/19 0516 06/06/19 1123  06/07/19 1124 06/07/19 1701 06/07/19 2148 06/08/19 0937 06/08/19 1602  NA 136 135 134* 135  --   --   --  133* 133*  --   K 3.0* 3.3* 3.0* 2.7*   < > 2.7* 2.7* 3.1* 2.8* 3.6  CL 107 108 106 104  --   --   --  103 103  --   CO2 21* 18* 21* 23  --   --   --  22 22  --   GLUCOSE 80 84 72 93  --   --   --  86 99  --   BUN 6 7 5* 6  --   --   --  5* 5*  --   CREATININE 1.57* 1.63* 1.57* 1.53*  --   --   --  1.62* 1.67*  --   CALCIUM 8.5* 8.4* 8.6* 8.7*  --   --   --  8.2* 8.3*  --   MG 1.5* 1.8  --   --   --   --   --   --   --   --    < > = values in this interval not displayed.    Liver Function Tests: Recent Labs  Lab 06/03/19 0007 06/03/19 0757 06/04/19 0357  AST 199* 178* 191*  ALT 63* 59* 56*  ALKPHOS 165* 157* 138*  BILITOT 10.0* 9.4* 9.3*  PROT 9.5* 8.8* 9.5*  ALBUMIN 1.5* 1.4* 1.7*   No results for input(s): LIPASE, AMYLASE in the last 168 hours. Recent Labs  Lab 06/03/19 0757 06/06/19 0614  AMMONIA 99* 111*   CBC: Recent Labs  Lab 06/03/19 0007 06/03/19 0757 06/04/19 0357 06/05/19 0516  WBC 3.4* 4.1 4.2 4.6  NEUTROABS 1.7 2.2 3.1  --   HGB 8.3* 7.4* 7.9* 7.8*  HCT 23.4* 20.8* 22.4* 21.9*  MCV 97.5 98.1 98.2 99.1  PLT 102* 111* 99* 109*   Cardiac Enzymes: No results for input(s): CKTOTAL, CKMB, CKMBINDEX, TROPONINI in the last 168 hours. BNP: Invalid input(s): POCBNP CBG: No results for input(s): GLUCAP in the last 168 hours. D-Dimer No results for input(s): DDIMER in the last 72 hours. Hgb A1c No results for input(s): HGBA1C in the last 72 hours. Lipid Profile No results for input(s): CHOL, HDL, LDLCALC, TRIG, CHOLHDL, LDLDIRECT in the last 72 hours. Thyroid function studies No results for input(s): TSH, T4TOTAL, T3FREE, THYROIDAB in the last 72 hours.  Invalid input(s): FREET3 Anemia work up No results for input(s): VITAMINB12, FOLATE, FERRITIN, TIBC, IRON, RETICCTPCT in the last 72 hours. Urinalysis    Component Value Date/Time   COLORURINE AMBER (A) 06/02/2019 2312   APPEARANCEUR CLEAR 06/02/2019 2312   LABSPEC 1.011 06/02/2019 2312   PHURINE 7.0 06/02/2019 2312   GLUCOSEU NEGATIVE 06/02/2019 2312   HGBUR NEGATIVE 06/02/2019 2312   BILIRUBINUR MODERATE (A) 06/02/2019 2312   KETONESUR NEGATIVE 06/02/2019 2312   PROTEINUR NEGATIVE 06/02/2019 2312   UROBILINOGEN 4.0 (H) 09/09/2014 1055   NITRITE NEGATIVE 06/02/2019 2312   LEUKOCYTESUR NEGATIVE 06/02/2019 2312   Sepsis Labs Invalid input(s): PROCALCITONIN,  WBC,  LACTICIDVEN Microbiology Recent Results (from the past 240 hour(s))  SARS Coronavirus 2     Status:  None  Collection Time: 06/03/19 12:30 AM  Result Value Ref Range Status   SARS Coronavirus 2 NOT DETECTED NOT DETECTED Final    Comment: (NOTE) SARS-CoV-2 target nucleic acids are NOT DETECTED. The SARS-CoV-2 RNA is generally detectable in upper and lower respiratory specimens during the acute phase of infection.  Negative  results do not preclude SARS-CoV-2 infection, do not rule out co-infections with other pathogens, and should not be used as the sole basis for treatment or other patient management decisions.  Negative results must be combined with clinical observations, patient history, and epidemiological information. The expected result is Not Detected. Fact Sheet for Patients: http://www.biofiredefense.com/wp-content/uploads/2020/03/BIOFIRE-COVID -19-patients.pdf Fact Sheet for Healthcare Providers: http://www.biofiredefense.com/wp-content/uploads/2020/03/BIOFIRE-COVID -19-hcp.pdf This test is not yet approved or cleared by the [place] and  has been authorized for detection and/or diagnosis of SARS-CoV-2 by FDA under an Emergency Use Authorization (EUA).  This EUA will remain in effec t (meaning this test can be used) for the duration of  the COVID-19 declaration under Section 564(b)(1) of the Act, 21 U.S.C. section 360bbb-3(b)(1), unless the authorization is terminated or revoked sooner. Performed at [facility], [address], [place], [name] [address]     Procedures/Studies: Ct Abdomen Pelvis Wo Contrast  Result Date: 06/03/2019 CLINICAL DATA:  Nausea and vomiting EXAM: CT ABDOMEN AND PELVIS WITHOUT CONTRAST TECHNIQUE: Multidetector CT imaging of the abdomen and pelvis was performed following the standard protocol without IV contrast. COMPARISON:  01/09/2019 FINDINGS: Lower chest: There is atelectasis at the lung bases. Hepatobiliary: There is cholelithiasis with mild gallbladder wall thickening. The liver is unremarkable Pancreas: Unremarkable. No  pancreatic ductal dilatation or surrounding inflammatory changes. Spleen: Normal in size without focal abnormality. Adrenals/Urinary Tract: Adrenal glands are unremarkable. Kidneys are normal, without renal calculi, focal lesion, or hydronephrosis. Bladder is unremarkable. Stomach/Bowel: There is some mild diffuse wall thickening of the distal esophagus. The stomach is otherwise unremarkable. There is no evidence of a small-bowel obstruction. The appendix is normal. The colon is unremarkable. Vascular/Lymphatic: There is no evidence of abdominal aortic aneurysm. There is some mild enlargement of several retroperitoneal lymph nodes. This appears similar to prior study. Reproductive: The prostate gland is mildly enlarged. Other: There is some mild free fluid scattered throughout the abdomen. There is some mesenteric edema. Musculoskeletal: No acute or significant osseous findings. IMPRESSION: 1. No definite acute intra-abdominal abnormality detected. 2. Small amount of free fluid in the abdomen. There is some mild mesenteric edema. 3. Cholelithiasis with mild gallbladder wall thickening. If there is high clinical suspicion for acute cholecystitis, follow-up with ultrasound is recommended. The gallbladder is not distended. Electronically Signed   By: Katherine Mantle M.D.   On: 06/03/2019 21:31   Vas [place] Lower Extremity Venous (dvt)  Result Date: 06/03/2019  Lower Venous Study Indications: Edema.  Comparison Study: No prior Performing Technologist: Jeb Levering RDMS, RVT  Examination Guidelines: A complete evaluation includes B-mode imaging, spectral Doppler, color Doppler, and power Doppler as needed of all accessible portions of each vessel. Bilateral testing is considered an integral part of a complete examination. Limited examinations for reoccurring indications may be performed as noted.  +---------+---------------+---------+-----------+----------+-------+ RIGHT     CompressibilityPhasicitySpontaneityPropertiesSummary +---------+---------------+---------+-----------+----------+-------+ CFV      Full           Yes      Yes                          +---------+---------------+---------+-----------+----------+-------+ SFJ  Full                                                 +---------+---------------+---------+-----------+----------+-------+ FV Prox  Full                                                 +---------+---------------+---------+-----------+----------+-------+ FV Mid   Full                                                 +---------+---------------+---------+-----------+----------+-------+ FV DistalFull                                                 +---------+---------------+---------+-----------+----------+-------+ PFV      Full                                                 +---------+---------------+---------+-----------+----------+-------+ POP      Full           Yes      Yes                          +---------+---------------+---------+-----------+----------+-------+ PTV      Full                                                 +---------+---------------+---------+-----------+----------+-------+ PERO     Full                                                 +---------+---------------+---------+-----------+----------+-------+   +---------+---------------+---------+-----------+----------+-------+ LEFT     CompressibilityPhasicitySpontaneityPropertiesSummary +---------+---------------+---------+-----------+----------+-------+ CFV      Full           Yes      Yes                          +---------+---------------+---------+-----------+----------+-------+ SFJ      Full                                                 +---------+---------------+---------+-----------+----------+-------+ FV Prox  Full                                                  +---------+---------------+---------+-----------+----------+-------+ FV Mid   Full                                                 +---------+---------------+---------+-----------+----------+-------+  FV DistalFull                                                 +---------+---------------+---------+-----------+----------+-------+ PFV      Full                                                 +---------+---------------+---------+-----------+----------+-------+ POP      Full           Yes      Yes                          +---------+---------------+---------+-----------+----------+-------+ PTV      Full                                                 +---------+---------------+---------+-----------+----------+-------+ PERO     Full                                                 +---------+---------------+---------+-----------+----------+-------+     Summary: Right: There is no evidence of deep vein thrombosis in the lower extremity. No cystic structure found in the popliteal fossa. Left: There is no evidence of deep vein thrombosis in the lower extremity. No cystic structure found in the popliteal fossa.  *See table(s) above for measurements and observations. Electronically signed by Coral Else MD on 06/03/2019 at 12:46:04 PM.    Final    (Echo, Carotid, EGD, Colonoscopy, ERCP)  Time coordinating discharge: Over 30 minutes  SIGNED:   Alessandra Bevels, MD  [facility] 06/08/2019, 5:15 PM Pager   If 7PM-7AM, please contact night-coverage [email or web address] Password [email or web address]

## 2019-06-07 NOTE — Progress Notes (Signed)
Rounded on pt and noticed IV tubing on the floor running. Pt was disoriented and said he d/c his IV tubing. Restarted IV medications and explained the importance of not removing IV tubing. Pt is receiving IV potassium for potassium level of 2.7. Provider notified.

## 2019-06-08 DIAGNOSIS — R4689 Other symptoms and signs involving appearance and behavior: Secondary | ICD-10-CM

## 2019-06-08 DIAGNOSIS — F319 Bipolar disorder, unspecified: Secondary | ICD-10-CM

## 2019-06-08 DIAGNOSIS — K7200 Acute and subacute hepatic failure without coma: Secondary | ICD-10-CM

## 2019-06-08 DIAGNOSIS — E871 Hypo-osmolality and hyponatremia: Secondary | ICD-10-CM

## 2019-06-08 DIAGNOSIS — E876 Hypokalemia: Secondary | ICD-10-CM

## 2019-06-08 LAB — BASIC METABOLIC PANEL
Anion gap: 8 (ref 5–15)
BUN: 5 mg/dL — ABNORMAL LOW (ref 6–20)
CO2: 22 mmol/L (ref 22–32)
Calcium: 8.3 mg/dL — ABNORMAL LOW (ref 8.9–10.3)
Chloride: 103 mmol/L (ref 98–111)
Creatinine, Ser: 1.67 mg/dL — ABNORMAL HIGH (ref 0.61–1.24)
GFR calc Af Amer: >60 mL/min (ref >60)
GFR calc non Af Amer: 55 mL/min — ABNORMAL LOW (ref >60)
Glucose, Bld: 99 mg/dL (ref 70–99)
Potassium: 2.8 mmol/L — ABNORMAL LOW (ref 3.5–5.1)
Sodium: 133 mmol/L — ABNORMAL LOW (ref 135–145)

## 2019-06-08 LAB — POTASSIUM: Potassium: 3.6 mmol/L (ref 3.5–5.1)

## 2019-06-08 LAB — MAGNESIUM: Magnesium: 1.8 mg/dL (ref 1.7–2.4)

## 2019-06-08 MED ORDER — SPIRONOLACTONE 25 MG PO TABS: 25.0000 mg | ORAL_TABLET | Freq: Two times a day (BID) | ORAL | 1 refills | Status: DC

## 2019-06-08 MED ORDER — POTASSIUM CHLORIDE ER 20 MEQ PO TBCR: 20.0000 meq | EXTENDED_RELEASE_TABLET | Freq: Every day | ORAL | 0 refills | Status: DC

## 2019-06-08 MED ORDER — MAGNESIUM OXIDE -MG SUPPLEMENT 200 MG PO TABS: 200.0000 mg | ORAL_TABLET | Freq: Every day | ORAL | 0 refills | Status: AC

## 2019-06-08 MED ORDER — POTASSIUM CHLORIDE 20 MEQ PO PACK
20.0000 meq | PACK | Freq: Once | ORAL | Status: AC
  Administered 2019-06-08: 20 meq via ORAL
  Filled 2019-06-08: qty 1

## 2019-06-08 MED ORDER — FUROSEMIDE 20 MG PO TABS: 20.0000 mg | ORAL_TABLET | Freq: Every day | ORAL | 1 refills | Status: AC | PRN

## 2019-06-08 MED ORDER — VALACYCLOVIR HCL 1 G PO TABS: 1000.0000 mg | ORAL_TABLET | ORAL | 1 refills | Status: AC

## 2019-06-08 MED ORDER — POTASSIUM CHLORIDE 10 MEQ/100ML IV SOLN
10.0000 meq | INTRAVENOUS | Status: AC
  Administered 2019-06-08 (×2): 10 meq via INTRAVENOUS
  Filled 2019-06-08: qty 100

## 2019-06-08 MED ORDER — POTASSIUM CHLORIDE 20 MEQ PO PACK
40.0000 meq | PACK | Freq: Once | ORAL | Status: AC
  Administered 2019-06-08: 40 meq via ORAL
  Filled 2019-06-08: qty 2

## 2019-06-08 MED ORDER — LACTULOSE 10 GM/15ML PO SOLN: 20.0000 g | Freq: Three times a day (TID) | ORAL | 0 refills | Status: DC

## 2019-06-08 NOTE — Progress Notes (Signed)
PROGRESS NOTE    Justin Ball  CXK:[id number]  DOB: 11-18-1991  DOA: 06/02/2019 PCP: Truman Hayward, MD  Brief Narrative:  Pt. with PMH of HIV disease with AIDS who has been noncompliant with medications; admitted on 06/02/2019, presented with complaint of bilateral leg swelling, was found to have decompensated cirrhosis. He is admitted to hospitalist service for diuresis with ID consultation. Per ID note patient has HIV, (+) AIDS with recent CD4 just at 200 in March 2020 and VL under good control @ 21 copies. 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 Since this time he has required several admissions for liver related concerns/hepatitis flares. Multiple no shows in the outpatient setting as well with concern over his family's inability to provide him good care.With no significant change in LFTs vs 3 months ago ID resumed Fluconazole 200 mg QD for secondary prophylaxis until his CD4 counts improve and to continue Bactrim single strength QD for OI prophylaxis as well.     Subjective: Patient sleeping and appears drowsy. Pulled out IV line yesterday but later complied with oral and IV potassium replacement. Levels improved to 3.1 and down to 2.8 again today.  Objective: Vitals:   06/07/19 1208 06/07/19 2105 06/08/19 0326 06/08/19 1514  BP: 115/82 111/71 114/63 (!) 112/55  Pulse: (!) 101 97 94 (!) 105  Resp: 18 16 19 18   Temp: 97.9 F (36.6 C) (!) 97.4 F (36.3 C) 98.3 F (36.8 C) 98.5 F (36.9 C)  TempSrc: Oral Oral Oral Oral  SpO2: 100% 100% 100% 93%  Weight:      Height:        Intake/Output Summary (Last 24 hours) at 06/08/2019 1650 Last data filed at 06/07/2019 2113 Gross per 24 hour  Intake 120 ml  Output -  Net 120 ml   Filed Weights   06/05/19 0503 06/06/19 0641 06/07/19 0200  Weight: 85.8 kg 83.7 kg 81.6 kg    Physical Examination:  General exam: Appears calm and  comfortable  Respiratory system: Clear to auscultation. Respiratory effort normal. Cardiovascular system: S1 & S2 heard, RRR. No JVD, murmurs, rubs, gallops or clicks. Trace leg/ pedal edema. Gastrointestinal system: Abdomen is nondistended, soft and nontender. No organomegaly or masses felt. Normal bowel sounds heard. Central nervous system: Alert and oriented. No focal neurological deficits. Extremities: Symmetric 5 x 5 power. Skin: No rashes, lesions or ulcers Psychiatry: Judgement and insight appear subnormal. Mood & affect appropriate.     Data Reviewed: I have personally reviewed following labs and imaging studies  CBC: Recent Labs  Lab 06/03/19 0007 06/03/19 0757 06/04/19 0357 06/05/19 0516  WBC 3.4* 4.1 4.2 4.6  NEUTROABS 1.7 2.2 3.1  --   HGB 8.3* 7.4* 7.9* 7.8*  HCT 23.4* 20.8* 22.4* 21.9*  MCV 97.5 98.1 98.2 99.1  PLT 102* 111* 99* 970*   Basic Metabolic Panel: Recent Labs  Lab 06/03/19 0757 06/04/19 0357 06/05/19 0516 06/06/19 1123 06/06/19 2014 06/07/19 1124 06/07/19 1701 06/07/19 2148 06/08/19 0937  NA 136 135 134* 135  --   --   --  133* 133*  K 3.0* 3.3* 3.0* 2.7* 3.1* 2.7* 2.7* 3.1* 2.8*  CL 107 108 106 104  --   --   --  103 103  CO2 21* 18* 21* 23  --   --   --  22 22  GLUCOSE 80 84 72 93  --   --   --  86 99  BUN 6 7 5* 6  --   --   --  5* 5*  CREATININE 1.57* 1.63* 1.57* 1.53*  --   --   --  1.62* 1.67*  CALCIUM 8.5* 8.4* 8.6* 8.7*  --   --   --  8.2* 8.3*  MG 1.5* 1.8  --   --   --   --   --   --   --    GFR: Estimated Creatinine Clearance: 74.4 mL/min (A) (by C-G formula based on SCr of 1.67 mg/dL (H)). Liver Function Tests: Recent Labs  Lab 06/03/19 0007 06/03/19 0757 06/04/19 0357  AST 199* 178* 191*  ALT 63* 59* 56*  ALKPHOS 165* 157* 138*  BILITOT 10.0* 9.4* 9.3*  PROT 9.5* 8.8* 9.5*  ALBUMIN 1.5* 1.4* 1.7*   No results for input(s): LIPASE, AMYLASE in the last 168 hours. Recent Labs  Lab 06/03/19 0757 06/06/19 0614   AMMONIA 99* 111*   Coagulation Profile: Recent Labs  Lab 06/03/19 0757 06/04/19 0357  INR 2.2* 2.3*   Cardiac Enzymes: No results for input(s): CKTOTAL, CKMB, CKMBINDEX, TROPONINI in the last 168 hours. BNP (last 3 results) No results for input(s): PROBNP in the last 8760 hours. HbA1C: No results for input(s): HGBA1C in the last 72 hours. CBG: No results for input(s): GLUCAP in the last 168 hours. Lipid Profile: No results for input(s): CHOL, HDL, LDLCALC, TRIG, CHOLHDL, LDLDIRECT in the last 72 hours. Thyroid Function Tests: No results for input(s): TSH, T4TOTAL, FREET4, T3FREE, THYROIDAB in the last 72 hours. Anemia Panel: No results for input(s): VITAMINB12, FOLATE, FERRITIN, TIBC, IRON, RETICCTPCT in the last 72 hours. Sepsis Labs: Recent Labs  Lab 06/04/19 0357  LATICACIDVEN 1.9    Recent Results (from the past 240 hour(s))  SARS Coronavirus 2     Status: None   Collection Time: 06/03/19 12:30 AM  Result Value Ref Range Status   SARS Coronavirus 2 NOT DETECTED NOT DETECTED Final    Comment: (NOTE) SARS-CoV-2 target nucleic acids are NOT DETECTED. The SARS-CoV-2 RNA is generally detectable in upper and lower respiratory specimens during the acute phase of infection.  Negative  results do not preclude SARS-CoV-2 infection, do not rule out co-infections with other pathogens, and should not be used as the sole basis for treatment or other patient management decisions.  Negative results must be combined with clinical observations, patient history, and epidemiological information. The expected result is Not Detected. Fact Sheet for Patients: http://www.biofiredefense.com/wp-content/uploads/2020/03/BIOFIRE-COVID -19-patients.pdf Fact Sheet for Healthcare Providers: http://www.biofiredefense.com/wp-content/uploads/2020/03/BIOFIRE-COVID -19-hcp.pdf This test is not yet approved or cleared by the [place] FDA and  has been authorized for detection and/or diagnosis  of SARS-CoV-2 by FDA under an Emergency Use Authorization (EUA).  This EUA will remain in effec t (meaning this test can be used) for the duration of  the COVID-19 declaration under Section 564(b)(1) of the Act, 21 U.S.C. section 360bbb-3(b)(1), unless the authorization is terminated or revoked sooner. Performed at [facility], [address] [place], [place] [address]       Radiology Studies: No results found.      Scheduled Meds: . Darunavir-Cobicisctat-Emtricitabine-Tenofovir Alafenamide  1 tablet Oral Q breakfast  . dolutegravir  50 mg Oral Daily  . enoxaparin (LOVENOX) injection  40 mg Subcutaneous Q24H  . fluconazole  200 mg Oral Daily  . lactulose  20 g Oral TID  . levETIRAcetam  1,000 mg Oral BID  . nicotine  21 mg Transdermal Daily  .  spironolactone  25 mg Oral BID  . sulfamethoxazole-trimethoprim  1 tablet Oral Daily  . valACYclovir  1,000 mg Oral Q M,W,F   Continuous Infusions:   Assessment & Plan:   1. Leg edema/Decompensated liver cirrhosis:Patient improved with IV albumin/lasix with resolved edema now.IV diuretics were subsequently held in concern for AKI and hyponatremia.  Will prescribe Aldactone at the time of discharge with instructions to take additional Lasix as needed.  He does have underlying Hep B/Hep C. Ammonia level elevated on admission at 90 and was started onlactulosebut patient wasrefusing due to dyspepsia. He was advised of worsening confusion/hepatic encephalopathy and he agreed to take if mixed in apple juice (tolerating well). No asterixis on exam and currently oriented x3. Lower extremity Doppler negative for any DVT.Echocardiogram shows preserved EF.  2.HIV/AIDS with low CD4 counts: Seen by ID. Continue antivirals and prophylactic abx. S/P recent admission for Cryptococcal meningitis.  Follow-up ID clinic upon discharge  3. AKI:in setting of diuresis.Baseline serum creatinine around 1.4. Presented with serum creatinine  of 1.19 and peaked to 1.63 IV diuretics.Currently off diuretics(no ascites or leg edema), creatinine stable around 1.5 -1.6.  Will resume Aldactone and advise Lasix as needed.  4.  Severe hypokalemia: Patient had potassium level of 3.2 on presentation and had further lowering in the setting of IV diuresis to potassium levels of 2.7.  He received multiple rounds of IV and oral potassium.  His magnesium level was 1.8 but received additional replacement for refractory hypokalemia.  He remained hypokalemic even after discontinuation of diuretics.  Could be related to GI losses/malabsorption as well. He also pulled out IV line yesterday and tends to refuse po meds.. Plan to  discharge patient on potassium sparing diuretics/Aldactone for problem #1 with 20 mEq potassium replacement therapy if repeat level okay .  He should have labs in 3-4 days to ensure adequate levels.  5.Dyspepsia : ?Medication induced. Improved now. Tolerating lactulose and potassium supplements when given in apple sauce. CT abdomen pelvis negative for any acute abnormality. Advanced diet to regular as tolerated  6. Normocytic anemia: Likely AOCD. W/U hemolysis -ve.?Bactrim induced. He also had mild epistaxis during the hospital course.   7. Hep B/ Hep C: Well known to ID clinic as mentioned above. F/U on discharge  8. Bipolar disorder:Grandmother reports intermittent anger bursts and aggressive behavior. Patient will be monitored closely and will be resumed on home regimen  9. Seizure disorder:Continue home regimen and monitor  10. Polysubstance abuse:Nicotine watch. No signs of alcohol withdrawal  10.  Disposition: Initially grandmother wanted to explore short-term rehab or group home placement but now she prefers to take him home with outpatient psychiatric/substance abuse center follow-up through [facility].  Seen by physical therapy who did not feel he had any rehab needs and recommended discharge home with 24-hour  supervision. I called grandmother- Fannie KneeSue to see if she can pick up today if labs normalize. No answer. Left a message. Called lab and requested stat results    DVT prophylaxis: compression stockings, lovenox Code Status: Full code Family / Patient Communication: d/w patient Disposition Plan: Likely home in am if potassium level reasonably improved.    LOS: 6 days    Time spent: 35 minutes    Alessandra BevelsNeelima Numa Schroeter, MD [facility] Pager [phone]  If 7PM-7AM, please contact night-coverage [email or web address] Password [email or web address] 06/08/2019, 4:50 PM

## 2019-06-09 ENCOUNTER — Telehealth: Payer: Self-pay | Admitting: Pharmacist

## 2019-06-09 MED FILL — LACTULOSE 10 GM/15 ML SOLUT: 10 | 2 days supply | Qty: 236 | Fill #0

## 2019-06-09 MED FILL — FUROSEMIDE 20 MG TABS: 20 | 30 days supply | Qty: 30 | Fill #0

## 2019-06-09 MED FILL — POTASSIUM CHLORIDE CRYS ER: 20 | 15 days supply | Qty: 15 | Fill #0

## 2019-06-09 MED FILL — valACYclovir HCL 1 GM TABS: 1 | 28 days supply | Qty: 12 | Fill #0

## 2019-06-09 MED FILL — SPIRONOLACTONE 25 MG TABS: 25 | 30 days supply | Qty: 30 | Fill #0

## 2019-06-09 NOTE — Telephone Encounter (Signed)
Holley Dexter, clinical pharmacist at [facility], called me today regarding patient and his HIV medications.  Patient was just released from the hospital last week and his grandmother came to the pharmacy today with a huge bag of medication.  His Symtuza and Tivicay were in that bag and his April fill of the medication was about 3/4 full.  His grandmother states that [facility] "PCP told me that he should not take his HIV medications". Christen and staff at [facility] spent time explaining everything to his grandmother including the fact that he needed to continue taking his ART.   Social work at the hospital was initially helping patient look for a group home or a short-term rehab facility but grandmother eventually refused and said he just needed an attitude adjustment. Patient's grandmother left [facility] with instructions to continue giving patient his HIV medications. Thayden follows up with Dr. Tommy Medal next week.

## 2019-06-10 NOTE — Telephone Encounter (Signed)
I do not know what to do with Justin Ball anymore I tried to get him admitted he claims to be taking his antivirals I do not know what is going on with him I think he will need to come to our clinic very frequently to have any chance of surviving

## 2019-06-19 ENCOUNTER — Telehealth: Payer: Self-pay

## 2019-06-19 ENCOUNTER — Other Ambulatory Visit: Payer: Self-pay | Admitting: Infectious Disease

## 2019-06-19 ENCOUNTER — Encounter: Payer: Self-pay | Admitting: Infectious Disease

## 2019-06-19 ENCOUNTER — Other Ambulatory Visit: Payer: Self-pay

## 2019-06-19 ENCOUNTER — Ambulatory Visit (INDEPENDENT_AMBULATORY_CARE_PROVIDER_SITE_OTHER): Payer: Medicaid Other | Admitting: Infectious Disease

## 2019-06-19 VITALS — BP 139/85 | HR 116 | Temp 98.4°F | Wt 179.0 lb

## 2019-06-19 DIAGNOSIS — Z23 Encounter for immunization: Secondary | ICD-10-CM

## 2019-06-19 DIAGNOSIS — B20 Human immunodeficiency virus [HIV] disease: Secondary | ICD-10-CM

## 2019-06-19 DIAGNOSIS — E876 Hypokalemia: Secondary | ICD-10-CM

## 2019-06-19 DIAGNOSIS — K7010 Alcoholic hepatitis without ascites: Secondary | ICD-10-CM

## 2019-06-19 DIAGNOSIS — Z9114 Patient's other noncompliance with medication regimen: Secondary | ICD-10-CM | POA: Diagnosis not present

## 2019-06-19 DIAGNOSIS — N179 Acute kidney failure, unspecified: Secondary | ICD-10-CM | POA: Diagnosis not present

## 2019-06-19 DIAGNOSIS — Z8661 Personal history of infections of the central nervous system: Secondary | ICD-10-CM | POA: Diagnosis not present

## 2019-06-19 DIAGNOSIS — Z982 Presence of cerebrospinal fluid drainage device: Secondary | ICD-10-CM | POA: Diagnosis not present

## 2019-06-19 DIAGNOSIS — B180 Chronic viral hepatitis B with delta-agent: Secondary | ICD-10-CM

## 2019-06-19 DIAGNOSIS — F3112 Bipolar disorder, current episode manic without psychotic features, moderate: Secondary | ICD-10-CM | POA: Diagnosis not present

## 2019-06-19 DIAGNOSIS — G40909 Epilepsy, unspecified, not intractable, without status epilepticus: Secondary | ICD-10-CM | POA: Diagnosis not present

## 2019-06-19 DIAGNOSIS — B182 Chronic viral hepatitis C: Secondary | ICD-10-CM

## 2019-06-19 DIAGNOSIS — Z91148 Patient's other noncompliance with medication regimen for other reason: Secondary | ICD-10-CM

## 2019-06-19 DIAGNOSIS — Z8619 Personal history of other infectious and parasitic diseases: Secondary | ICD-10-CM

## 2019-06-19 HISTORY — DX: Acute kidney failure, unspecified: N17.9

## 2019-06-19 LAB — COMPREHENSIVE METABOLIC PANEL
ALT: 83 U/L — ABNORMAL HIGH (ref 0–44)
AST: 196 U/L — ABNORMAL HIGH (ref 15–41)
Albumin: 2 g/dL — ABNORMAL LOW (ref 3.5–5.0)
Alkaline Phosphatase: 159 U/L — ABNORMAL HIGH (ref 38–126)
Anion gap: 7 (ref 5–15)
BUN: 10 mg/dL (ref 6–20)
CO2: 20 mmol/L — ABNORMAL LOW (ref 22–32)
Calcium: 8.8 mg/dL — ABNORMAL LOW (ref 8.9–10.3)
Chloride: 103 mmol/L (ref 98–111)
Creatinine, Ser: 1.32 mg/dL — ABNORMAL HIGH (ref 0.61–1.24)
GFR calc Af Amer: >60 mL/min (ref >60)
GFR calc non Af Amer: >60 mL/min (ref >60)
Glucose, Bld: 102 mg/dL — ABNORMAL HIGH (ref 70–99)
Potassium: 3.6 mmol/L (ref 3.5–5.1)
Sodium: 130 mmol/L — ABNORMAL LOW (ref 135–145)
Total Bilirubin: 9.9 mg/dL — ABNORMAL HIGH (ref 0.3–1.2)
Total Protein: 10.1 g/dL — ABNORMAL HIGH (ref 6.5–8.1)

## 2019-06-19 MED ORDER — POTASSIUM CHLORIDE CRYS ER 20 MEQ PO TBCR: 20.0000 meq | EXTENDED_RELEASE_TABLET | Freq: Every day | ORAL | 1 refills | Status: DC

## 2019-06-19 MED FILL — levETIRAcetam 1000 MG TABS: 1000 | 30 days supply | Qty: 60 | Fill #1

## 2019-06-19 MED FILL — FLUCONAZOLE 200 MG TAB: 200 | 30 days supply | Qty: 30 | Fill #1

## 2019-06-19 MED FILL — QUETIAPINE 25 MG TABLET: 25 | 30 days supply | Qty: 60 | Fill #1

## 2019-06-19 MED FILL — TIVICAY 50 MG TABLET: 50 | 30 days supply | Qty: 30 | Fill #1

## 2019-06-19 MED FILL — SYMTUZA 800-150-200-10 MG T: 800-150-200 | 30 days supply | Qty: 30 | Fill #1

## 2019-06-19 NOTE — Progress Notes (Signed)
HPI: Justin Ball is a 28 y.o. male who presents to the [facility] pharmacy clinic for HIV follow-up.  Patient Active Problem List   Diagnosis Date Noted  . AKI (acute kidney injury) (HCC) 06/19/2019  . Aggressive behavior 06/02/2019  . Acute CHF (congestive heart failure) (HCC) 06/02/2019  . Edema 06/02/2019  . Epistaxis 06/02/2019  . URI (upper respiratory infection) 02/24/2019  . Liver failure (HCC) 01/15/2019  . Alcoholic hepatitis 01/14/2019  . Nausea and vomiting 01/14/2019  . RUQ pain   . Anxiety   . Jaundice 01/08/2019  . Perianal ulcer (HCC) 11/14/2018  . Hepatitis C virus infection without hepatic coma   . Acute renal failure (HCC)   . Non compliance w medication regimen   . Prolonged Q-T interval on ECG   . Intraabdominal fluid collection   . Seizure disorder (HCC) 09/12/2017  . 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 History of syphilis   . Tobacco abuse   . History of cryptococcal meningitis   . Worsening headaches 05/20/2017  . 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 Hypokalemia 09/12/2016  . Anemia 09/12/2016  . AIDS (acquired immune deficiency syndrome) (HCC) 01/19/2015  . 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 Bipolar disorder (HCC) 04/08/2013    Patient's Medications  New Prescriptions   No medications on file  Previous Medications   DARUNAVIR-COBICISCTAT-EMTRICITABINE-TENOFOVIR ALAFENAMIDE (SYMTUZA) 800-150-200-10 MG TABS    Take 1 tablet by mouth daily with breakfast.   DOLUTEGRAVIR (TIVICAY) 50 MG TABLET    Take 1 tablet (50 mg total) by mouth daily.   FLUCONAZOLE (DIFLUCAN) 200 MG TABLET    Take 1 tablet (200 mg total) by mouth daily.   FUROSEMIDE (LASIX) 20 MG TABLET    Take 1 tablet (20 mg total) by mouth daily as needed for fluid or edema (leg swellings). Take extra dose of potassium if you take this medication   LACTULOSE (CHRONULAC) 10 GM/15ML SOLUTION    Take 30 mLs (20 g total)  by mouth 3 (three) times daily.   LEVETIRACETAM (KEPPRA) 1000 MG TABLET    Take 1 tablet (1,000 mg total) by mouth 2 (two) times daily.   MAGNESIUM OXIDE 200 MG TABS    Take 1 tablet (200 mg total) by mouth daily.   ONDANSETRON (ZOFRAN) 4 MG TABLET    Take 1 tablet (4 mg total) by mouth every 6 (six) hours as needed for nausea or vomiting.   POTASSIUM CHLORIDE 20 MEQ TBCR    Take 20 mEq by mouth daily for 15 days.   QUETIAPINE (SEROQUEL) 25 MG TABLET    Take 1 tablet (25 mg total) by mouth 2 (two) times daily as needed (agitation).   SPIRONOLACTONE (ALDACTONE) 25 MG TABLET    Take 1 tablet (25 mg total) by mouth 2 (two) times daily.   SULFAMETHOXAZOLE-TRIMETHOPRIM (BACTRIM) 400-80 MG TABLET    Take 1 tablet by mouth daily.   VALACYCLOVIR (VALTREX) 1000 MG TABLET    Take 1 tablet (1,000 mg total) by mouth every Monday, Wednesday, and Friday.  Modified Medications   No medications on file  Discontinued Medications   No medications on file    Allergies: Allergies  Allergen Reactions  . Acyclovir And Related Itching    Past Medical History: Past Medical History:  Diagnosis Date  . Aggressive behavior 06/02/2019  . AIDS due to HIV-I [facility](HCC) infectious disease--- dr Zenaida [place]   CD4=50  on 08-02-2017  . AKI (acute kidney injury) (HCC) 06/19/2019  . Alcoholic hepatitis 01/14/2019  . Anxiety   . 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 Bipolar 1 disorder (HCC)   . 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 Cognitive communication deficit    working w/ therapy  . 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 Depression   . 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 Fecal incontinence 11/19/2017  . Foley catheter in place   .  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 History of scabies 2013  . History of syphilis   . 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 Nausea and vomiting 01/14/2019  . 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 Neurogenic bowel    wears depends  . Neuromuscular disorder (HCC)   . 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 Seizure disorder [facility](HCC)    neurologist-  dr Marjory Liespenumalli ([facility])--caused by cryptococcal meningitis/ multiple infarctions--  no seizure since hospitalization May 2018  . Status epilepticus (HCC)   . URI (upper respiratory infection) 02/24/2019  . Visuospatial deficit     Social History: Social History   Socioeconomic History  . Marital status: Single    Spouse name: Not on file  . Number of children: Not on file  . Years of education: Not on file  . Highest education level: Not on file  Occupational History  . Not on file  Social Needs  . Financial resource strain: Not on file  . Food insecurity    Worry: Not on file    Inability: Not on file  . Transportation needs    Medical: Not on file    Non-medical: Not on file  Tobacco Use  . Smoking status: Current Every Day Smoker    Packs/day: 0.50    Years: 5.00    Pack years: 2.50    Types: Cigarettes  . Smokeless tobacco: Never Used  . Tobacco comment: cutting back  Substance and Sexual Activity  . Alcohol use: Yes  . Drug use: No  . Sexual activity: Yes    Partners: Male    Birth control/protection: Condom    Comment: irregular condom use; educated  Lifestyle  . Physical activity    Days per week: Not on file    Minutes per session: Not on file  . Stress: Not on file  Relationships  . Social Musicianconnections    Talks on phone: Not on file    Gets together: Not on file    Attends religious  service: Not on file    Active member of club or organization: Not on file    Attends meetings of clubs or organizations: Not on file    Relationship status: Not on file  Other Topics Concern  . Not on file  Social History Narrative  . Not on file    Labs: Lab Results  Component Value Date   HIV1RNAQUANT 90 06/04/2019   HIV1RNAQUANT 21 (H) 02/24/2019   HIV1RNAQUANT 490 (H) 01/14/2019   CD4TABS 84 (L) 06/04/2019  CD4TABS 200 (L) 02/24/2019   CD4TABS 160 (L) 01/14/2019    RPR and STI Lab Results  Component Value Date   LABRPR Non Reactive 06/04/2019   LABRPR Reactive (A) 06/03/2019   LABRPR NON-REACTIVE 02/24/2019   LABRPR NON-REACTIVE 01/14/2019   LABRPR NON-REACTIVE 11/14/2018   RPRTITER 1:64 (A) 10/26/2009    STI Results GC CT  02/24/2019 Negative Negative  01/14/2019 Negative Negative  03/20/2018 Negative Negative  04/18/2017 Negative Negative  09/12/2016 Negative Negative  02/24/2015 **NG: POSITIVE**(A) **CT: POSITIVE**(A)  01/19/2015 NG: Negative CT: Negative  10/26/2009 - NEGATIVERTFDELIM(NOTE)  Testing performed using the BD Probetec ET Chlamydia trachomatis and Neisseria gonorrhea amplified DNA assay.    Hepatitis B Lab Results  Component Value Date   HEPBSAB Non Reactive 03/07/2017   HEPBSAG Positive (A) 01/08/2019   HEPBCAB POS (A) 10/10/2012   Hepatitis C Lab Results  Component Value Date   HCVRNAPCRQN 2,210,000 (H) 11/14/2018   Hepatitis A Lab Results  Component Value Date   HAV NEG 10/10/2012   Lipids: Lab Results  Component Value Date   CHOL 125 09/28/2016   TRIG 110 01/17/2018   HDL 31 (L) 09/28/2016   CHOLHDL 4.0 09/28/2016   VLDL 24 09/28/2016   LDLCALC 70 09/28/2016    Current HIV Regimen: Symtuza + Tivicay  Assessment & Plan: Luan is here today to follow-up with Dr. Tommy Medal.  He has multiple medical issues and problems with adherence to his ART regimen.  He comes in today with his grandmother.  She asked me to fill a pill box for  a couple of days with his medication and she can copy it when she gets home.  I filled 3 days worth and showed her the pill box to make sure it all made sense.  He is due for a refill of his Symtuza, Tivicay, and fluconazole which the pharmacy will get ready today. She will take him to pick it up later this afternoon.  All questions answered.  Kru Allman L. Zafira Munos, PharmD, BCIDP, AAHIVP, [place] [facility] 06/19/2019, 4:56 PM

## 2019-06-19 NOTE — Telephone Encounter (Signed)
-----   Message from Truman Hayward, MD sent at 06/19/2019  1:13 PM EDT ----- LIiston's K not bad. I sent rx for continuing 54meq KCL daily

## 2019-06-19 NOTE — Progress Notes (Signed)
Subjective:   Chief complaint: Follow-up for labs including hypokalemia and acute kidney injury   Patient ID: Justin Ball, male    DOB: [birth year], 28 y.o.   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  Subsequently had became infected and had to be removed.  [name] HAD INIITIALLY been  doing relatively well while living with his grandmother who ensured that he took his medications.  Unfortunately though he ran off with a boyfriend even though Justin Ball is not Competent to make such decisions and ultimately contracted hepatitis C was admitted to the hospital for this.  Is been admitted several times with headaches and still had positive cryptococcal antigens and spinal fluid and being reinduced with amphotericin followed by fluconazole.  He then later came to the he came to the ER with a headache and blurry vision.  He underwent lumbar puncture which had a normal opening present pressure but which revealed a positive cryptococcal antigen in CSF and in serum.  Rather than be re-induced with amphotericin he was simply continued on fluconazole.  Upon admission he was found to have markedly elevated liver function tests with an AST of 604 ALT of 142 alkaline phosphatase of 227 and a bilirubin of 23.4.  He had icterus abdominal pain and nausea and vomiting.  He developed acute kidney injury likely due to dehydration while in the hospital.  My partner Dr. Linus Salmons changed his antiretrovirals from [facility] [place] to [facility] and [facility]  It was unclear to our team or hepatology what was causing his elevation of his transaminases and bilirubin.  Consideration was given to reactivation of hepatitis B if he had been off of his antiretrovirals but his hepatitis B was actually well controlled.  We still not  have his viral load back.  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  His hepatitis C was certainly active as well.  Ultimately his liver function test stabilized and his creatinine improved.  He was discharged to home.  Of note he also developed a pruritic rash on his back and arms.  Since discharge in the hospital he has been vomiting multiple times per day as he does not have any anti-nausea medicines  In talking to him and his grandmother several visits ago and  grandmother disclosed to me that he has been purchasing and by being large amounts of alcohol over the week to 2 weeks prior to his admission. labs in today or not becoming dramatically worse.   We ultimately had him admitted to the hospital of worsening liver failure back in January.  It was very clear that he is not competent to take care of himself and that he would benefit the being in a structured environment.   At that time arrangements were going to be made to try to have him placed in a skilled facility.  However ultimately grandmother mother decided that they could try to increase his personal care hours at home.  That at that time did not wish for placement in a group home or assisted living situation.  Since then Justin Ball is sporadically been seen in the clinic.  He had been complaining of epistaxis and made several appointments with me all of which he had not made until today.  Pharmacy showed me when the last time he  had filled his medications with them and this was in April.  His grandmother did bring in medications that did have pills in the most of them dated April some dated back into November there is no fluconazole present whatsoever.  In the interim months and had developed episodic episodes of epistaxis which concerned his sister and prompted calls to our clinic.  He is also complaining of severely painful lower extremity edema.  In talking to his grandmother she says that months  is becoming increasingly aggressive and that no one will ever visit the house anymore because of how aggressive he is and yelling at them.  I was very concerned about all of these things and also her ability to manage him as an outpatient and in the big scheme of things his ability to be managed by his grandmother.  He was admitted the hospital and consideration for long-term placement was again given but again ultimately decided against.  In the interim his hypokalemia was improved he did have acute kidney injury which seems to be stable though he did not come back to normal.  He is currently on Lasix as well as spironolactone and potassium.  He has not been taking his lactulose which his grandmother thought was supposed to be for nausea, not understanding this was to prevent hepatic encephalopathy  His edema has improved since leaving the hospital.  His grandmother states that [name] has not been drinking alcohol since he got to the hospital and believes he is ready to do better but she believes he also needs more help at home and is asking for an aide to be present to help which I think is reasonable.      Past Medical History:  Diagnosis Date  . Aggressive behavior 06/02/2019  . 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 AKI (acute kidney injury) (HCC) 06/19/2019  . Alcoholic hepatitis 01/14/2019  . Anxiety   . 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 Bipolar 1 disorder (HCC)   . 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 Cognitive communication deficit    working w/ therapy  . 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 Depression   . 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 Fecal incontinence 11/19/2017  . Foley catheter in place   . 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 History of scabies 2013  . History of syphilis   . 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 Nausea and vomiting 01/14/2019  . 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 Neurogenic bowel    wears depends  . Neuromuscular disorder (HCC)   . 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 Seizure disorder [facility](HCC)    neurologist-  dr Marjory Liespenumalli ([facility])--caused by cryptococcal meningitis/ multiple infarctions--  no seizure since hospitalization May 2018  . Status epilepticus (HCC)   . URI (upper respiratory infection) 02/24/2019  . Visuospatial deficit     Past Surgical History:  Procedure Laterality Date  . CYSTOSCOPY N/A 10/10/2017  Procedure: CYSTOSCOPY;  Surgeon: Sebastian Ache, MD;  Location: [facility];  Service: Urology;  Laterality: N/A;  . EXAMINATION UNDER ANESTHESIA N/A 12/28/2014   Procedure: EXAM UNDER ANESTHESIA;  Surgeon: Karie Soda, MD;  Location: [facility];  Service: General;  Laterality: N/A;  . FLOOR OF MOUTH BIOPSY N/A 09/16/2016   Procedure: BIOPSY OF ORAL ABCESS;  Surgeon: Newman Pies, MD;  Location: [facility];  Service: ENT;  Laterality: N/A;  . INCISION AND DRAINAGE ABSCESS N/A 09/16/2016   Procedure: INCISION AND DRAINAGE ABSCESS;  Surgeon: Newman Pies, MD;  Location: [facility];  Service: ENT;  Laterality: N/A;  . INCISION AND DRAINAGE PERIRECTAL ABSCESS  09/ 03/ 2011   dr toth  . INCISION AND DRAINAGE PERIRECTAL ABSCESS N/A 12/28/2014   Procedure: IRRIGATION AND DEBRIDEMENT PERIRECTAL ABSCESS;  Surgeon: Karie Soda, MD;  Location: [facility];  Service: General;  Laterality: N/A;  Fistula repair and ablation  . INSERTION OF SUPRAPUBIC CATHETER N/A 10/10/2017    Procedure: INSERTION OF SUPRAPUBIC CATHETER;  Surgeon: Sebastian Ache, MD;  Location: [facility];  Service: Urology;  Laterality: N/A;  . LAPAROSCOPIC REVISION VENTRICULAR-PERITONEAL (V-P) SHUNT N/A 06/12/2017   Procedure: LAPAROSCOPIC REVISION VENTRICULAR-PERITONEAL (V-P) SHUNT;  Surgeon: Kinsinger, De Blanch, MD;  Location: [facility];  Service: General;  Laterality: N/A;  . SHUNT REMOVAL N/A 01/01/2018   Procedure: REMOVAL OF V-P SHUNT, PLACEMENT OF EVD;  Surgeon: [name], MD;  Location: [facility];  Service: Neurosurgery;  Laterality: N/A;  . TRANSTHORACIC ECHOCARDIOGRAM  06/02/2017   EF 65-70%/  trivial PR  . VENTRICULOPERITONEAL SHUNT N/A 06/12/2017   Procedure: SHUNT INSERTION VENTRICULAR-PERITONEAL;  Surgeon: [name], MD;  Location: [facility];  Service: Neurosurgery;  Laterality: N/A;    Family History  Problem Relation Age of Onset  . Hypertension Other       Social History   Socioeconomic History  . Marital status: Single    Spouse name: Not on file  . Number of children: Not on file  . Years of education: Not on file  . Highest education level: Not on file  Occupational History  . Not on file  Social Needs  . Financial resource strain: Not on file  . Food insecurity    Worry: Not on file    Inability: Not on file  . Transportation needs    Medical: Not on file    Non-medical: Not on file  Tobacco Use  . Smoking status: Current Every Day Smoker    Packs/day: 0.50    Years: 5.00    Pack years: 2.50    Types: Cigarettes  . Smokeless tobacco: Never Used  . Tobacco comment: cutting back  Substance and Sexual Activity  . Alcohol use: Yes  . Drug use: No  . Sexual activity: Yes    Partners: Male    Birth control/protection: Condom    Comment: irregular condom use; educated  Lifestyle  . Physical activity    Days per week: Not on file    Minutes per session: Not on file  . Stress: Not on file  Relationships  . Social Wellsite geologist on phone: Not on file    Gets together: Not on file    Attends religious service: Not on file    Active member of club or organization: Not on file    Attends meetings of clubs or organizations: Not on file    Relationship status: Not on file  Other Topics Concern  . Not  on file  Social History Narrative  . Not on file    Allergies  Allergen Reactions  . Acyclovir And Related Itching     Current Outpatient Medications:  .  Darunavir-Cobicisctat-Emtricitabine-Tenofovir Alafenamide (SYMTUZA) 800-150-200-10 MG TABS, Take 1 tablet by mouth daily with breakfast., Disp: 30 tablet, Rfl: 11 .  dolutegravir (TIVICAY) 50 MG tablet, Take 1 tablet (50 mg total) by mouth daily., Disp: 30 tablet, Rfl: 11 .  fluconazole (DIFLUCAN) 200 MG tablet, Take 1 tablet (200 mg total) by mouth daily., Disp: 30 tablet, Rfl: 11 .  furosemide (LASIX) 20 MG tablet, Take 1 tablet (20 mg total) by mouth daily as needed for fluid or edema (leg swellings). Take extra dose of potassium if you take this medication, Disp: 30 tablet, Rfl: 1 .  lactulose (CHRONULAC) 10 GM/15ML solution, Take 30 mLs (20 g total) by mouth 3 (three) times daily., Disp: 236 mL, Rfl: 0 .  levETIRAcetam (KEPPRA) 1000 MG tablet, Take 1 tablet (1,000 mg total) by mouth 2 (two) times daily., Disp: 60 tablet, Rfl: 11 .  Magnesium Oxide 200 MG TABS, Take 1 tablet (200 mg total) by mouth daily., Disp: 30 tablet, Rfl: 0 .  ondansetron (ZOFRAN) 4 MG tablet, Take 1 tablet (4 mg total) by mouth every 6 (six) hours as needed for nausea or vomiting., Disp: 120 tablet, Rfl: 2 .  potassium chloride 20 MEQ TBCR, Take 20 mEq by mouth daily for 15 days., Disp: 15 tablet, Rfl: 0 .  QUEtiapine (SEROQUEL) 25 MG tablet, Take 1 tablet (25 mg total) by mouth 2 (two) times daily as needed (agitation)., Disp: 60 tablet, Rfl: 11 .  spironolactone (ALDACTONE) 25 MG tablet, Take 1 tablet (25 mg total) by mouth 2 (two) times daily., Disp: 30 tablet, Rfl: 1 .   sulfamethoxazole-trimethoprim (BACTRIM) 400-80 MG tablet, Take 1 tablet by mouth daily., Disp: 30 tablet, Rfl: 3 .  valACYclovir (VALTREX) 1000 MG tablet, Take 1 tablet (1,000 mg total) by mouth every Monday, Wednesday, and Friday., Disp: 30 tablet, Rfl: 1   Review of Systems  Unable to perform ROS: Dementia       Objective:   Physical Exam Constitutional:      General: He is not in acute distress.    Appearance: He is not diaphoretic.  HENT:     Head: Normocephalic.   Eyes:     General: Scleral icterus present.        Right eye: No discharge.  Neck:     Musculoskeletal: Normal range of motion.     Vascular: No JVD.     Trachea: No tracheal deviation.  Cardiovascular:     Rate and Rhythm: Normal rate and regular rhythm.  Pulmonary:     Effort: Pulmonary effort is normal. No respiratory distress.     Breath sounds: No wheezing.  Abdominal:     Palpations: Abdomen is soft.  Musculoskeletal: Normal range of motion.        General: No deformity.     Right lower leg: Edema present.     Left lower leg: Edema present.  Skin:    General: Skin is warm and dry.     Coloration: Skin is jaundiced.     Findings: Rash present. No erythema.  Neurological:     General: No focal deficit present.     Mental Status: He is alert.     Cranial Nerves: No cranial nerve deficit.     Sensory: No sensory deficit.     Gait: Gait  normal.     Comments: Oriented to person and place and more oriented than in the past  Psychiatric:        Mood and Affect: Mood is depressed.        Speech: Speech is delayed.        Behavior: Behavior is slowed.        Cognition and Memory: Cognition is impaired. Memory is impaired.        Judgment: Judgment is impulsive.    Edema has improved since he was in the hospital.     Assessment & Plan:   Hypokalemia: We will check a metabolic panel stat today and determine whether he needs further potassium supplementation along with his spironolactone and  furosemide  Acute kidney kidney injury: Hopefully his kidney function is stable if not improved  I am going to schedule him with me next week but I am also getting him into the internal medicine clinic so that he can have more than 1 provider trying to manage his complicated case   HIV disease: Continue TIVICAY on Symtuza.  His grandmother claims that they had been receiving twice the number of medications for several months but this is not consistent with our pharmacy of seeing our pharmacy of only seen and fill the meds with was a long pharmacy in November and then in April.  He does appear to have been taking medications recently as his viral load is below 100.  CD4 count however has dropped the percent is the same.  He should continue on Bactrim for PCP prophylaxis and should stay on fluconazole given his history of cryptococcal meningitis    Cryptococcal meningitis: would continue  his fluconazole if LFTs not terribly high  Chronic hepatitis B without delta agent: His hep B viral load was only 20   Chronic hepatitis C: Would like to treat this at some point but is hard to interpret his liver function test and his fiber sure is in the context of these fulminant flares of hepatitis'   Fibrosis of the liver : superimposed alcoholic hepatitis on top of his chronic hep B and hepatitis C: Would like to have him seen by gastroenterology and referred him to Dr. Leone PayorGessner.  In the interim he is resume his lactulose.   I spent greater than 40 minutes with the patient including greater than 50% of time in face to face counsel of the patient his grandmother with regards to his HIV medications and how to take them, regards all of his other medications including his diuretics, his potassium his antiseizure medications his fluconazole for cryptococcal meningitis, explaining the need for avoidance of alcohol and hepatotoxins, trying to explore ways to support him best including working with a pharmacy  tried health project and getting him connected to primary care and hepatology and in coordination of his care.

## 2019-06-20 LAB — T-HELPER CELL (CD4) - (RCID CLINIC ONLY)
CD4 % Helper T Cell: 12 % — ABNORMAL LOW (ref 33–65)
CD4 T Cell Abs: 135 /uL — ABNORMAL LOW (ref 400–1790)

## 2019-06-20 MED FILL — POTASSIUM CHLORIDE CRYS ER: 20 | 30 days supply | Qty: 30 | Fill #0

## 2019-06-23 NOTE — Progress Notes (Signed)
I SO wish I could!! The family has asked me not to contact them, I am not sure why. The family and I have never had a disagreement, but I think Evertt believes I had something to do with his grandmother gaining control of his check.

## 2019-06-24 ENCOUNTER — Telehealth: Payer: Self-pay | Admitting: *Deleted

## 2019-06-24 NOTE — Telephone Encounter (Signed)
Patient grandmother called concerned because the patient wants to drive. He is activly looking to buy a car and she is afraid he is not ready to drive. She states he has license from before his decline and will not listen to her.  Advised her he has an appointment 06/25/19 and she could come to that visit and address with the provider and patient but other than that not sure what we can do. She advise she will be there.

## 2019-06-25 ENCOUNTER — Ambulatory Visit (INDEPENDENT_AMBULATORY_CARE_PROVIDER_SITE_OTHER): Payer: Medicaid Other | Admitting: Infectious Disease

## 2019-06-25 ENCOUNTER — Ambulatory Visit (INDEPENDENT_AMBULATORY_CARE_PROVIDER_SITE_OTHER): Payer: Medicaid Other | Admitting: Internal Medicine

## 2019-06-25 ENCOUNTER — Other Ambulatory Visit: Payer: Self-pay

## 2019-06-25 ENCOUNTER — Encounter: Payer: Self-pay | Admitting: Internal Medicine

## 2019-06-25 VITALS — BP 117/80 | HR 98 | Temp 98.0°F | Ht 73.0 in | Wt 188.0 lb

## 2019-06-25 DIAGNOSIS — Z720 Tobacco use: Secondary | ICD-10-CM

## 2019-06-25 DIAGNOSIS — B182 Chronic viral hepatitis C: Secondary | ICD-10-CM

## 2019-06-25 DIAGNOSIS — Z888 Allergy status to other drugs, medicaments and biological substances status: Secondary | ICD-10-CM

## 2019-06-25 DIAGNOSIS — K7010 Alcoholic hepatitis without ascites: Secondary | ICD-10-CM

## 2019-06-25 DIAGNOSIS — K7210 Chronic hepatic failure without coma: Secondary | ICD-10-CM | POA: Diagnosis present

## 2019-06-25 DIAGNOSIS — B20 Human immunodeficiency virus [HIV] disease: Secondary | ICD-10-CM

## 2019-06-25 DIAGNOSIS — Z8661 Personal history of infections of the central nervous system: Secondary | ICD-10-CM

## 2019-06-25 DIAGNOSIS — B1920 Unspecified viral hepatitis C without hepatic coma: Secondary | ICD-10-CM | POA: Diagnosis not present

## 2019-06-25 DIAGNOSIS — K7200 Acute and subacute hepatic failure without coma: Secondary | ICD-10-CM

## 2019-06-25 DIAGNOSIS — B180 Chronic viral hepatitis B with delta-agent: Secondary | ICD-10-CM

## 2019-06-25 DIAGNOSIS — F0280 Dementia in other diseases classified elsewhere without behavioral disturbance: Secondary | ICD-10-CM

## 2019-06-25 DIAGNOSIS — Z79899 Other long term (current) drug therapy: Secondary | ICD-10-CM | POA: Diagnosis not present

## 2019-06-25 DIAGNOSIS — G40909 Epilepsy, unspecified, not intractable, without status epilepticus: Secondary | ICD-10-CM

## 2019-06-25 DIAGNOSIS — B1910 Unspecified viral hepatitis B without hepatic coma: Secondary | ICD-10-CM | POA: Diagnosis not present

## 2019-06-25 DIAGNOSIS — Z8619 Personal history of other infectious and parasitic diseases: Secondary | ICD-10-CM

## 2019-06-25 DIAGNOSIS — F3112 Bipolar disorder, current episode manic without psychotic features, moderate: Secondary | ICD-10-CM

## 2019-06-25 DIAGNOSIS — N179 Acute kidney failure, unspecified: Secondary | ICD-10-CM

## 2019-06-25 DIAGNOSIS — R17 Unspecified jaundice: Secondary | ICD-10-CM

## 2019-06-25 LAB — CBC WITH DIFFERENTIAL/PLATELET
Absolute Monocytes: 477 cells/uL (ref 200–950)
Basophils Absolute: 41 cells/uL (ref 0–200)
Basophils Relative: 0.9 %
Eosinophils Absolute: 50 cells/uL (ref 15–500)
Eosinophils Relative: 1.1 %
HCT: 24.2 % — ABNORMAL LOW (ref 38.5–50.0)
Hemoglobin: 9.3 g/dL — ABNORMAL LOW (ref 13.2–17.1)
Lymphs Abs: 1332 cells/uL (ref 850–3900)
MCH: 36.3 pg — ABNORMAL HIGH (ref 27.0–33.0)
MCHC: 38.4 g/dL — ABNORMAL HIGH (ref 32.0–36.0)
MCV: 94.5 fL (ref 80.0–100.0)
MPV: 10.6 fL (ref 7.5–12.5)
Monocytes Relative: 10.6 %
Neutro Abs: 2601 cells/uL (ref 1500–7800)
Neutrophils Relative %: 57.8 %
Platelets: 104 10*3/uL — ABNORMAL LOW (ref 140–400)
RBC: 2.56 10*6/uL — ABNORMAL LOW (ref 4.20–5.80)
RDW: 13.8 % (ref 11.0–15.0)
Total Lymphocyte: 29.6 %
WBC: 4.5 10*3/uL (ref 3.8–10.8)

## 2019-06-25 LAB — HIV-1 RNA QUANT-NO REFLEX-BLD
HIV 1 RNA Quant: 46 copies/mL — ABNORMAL HIGH
HIV-1 RNA Quant, Log: 1.66 Log copies/mL — ABNORMAL HIGH

## 2019-06-25 LAB — RPR: RPR Ser Ql: NONREACTIVE

## 2019-06-25 LAB — CBC MORPHOLOGY

## 2019-06-25 NOTE — Progress Notes (Signed)
Subjective:   Chief complaint: " I am bored"  Patient ID: Justin Ball, male    DOB: 02/09/1991, 28 y.o.   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  Subsequently had became infected and had to be removed.  [name] HAD INIITIALLY been  doing relatively well while living with his grandmother who ensured that he took his medications.  Unfortunately though he ran off with a boyfriend even though [name] is not Competent to make such decisions and ultimately contracted hepatitis C was admitted to the hospital for this.  Is been admitted several times with headaches and still had positive cryptococcal antigens and spinal fluid and being reinduced with amphotericin followed by fluconazole.  He then later came to the he came to the ER with a headache and blurry vision.  He underwent lumbar puncture which had a normal opening present pressure but which revealed a positive cryptococcal antigen in CSF and in serum.  Rather than be re-induced with amphotericin he was simply continued on fluconazole.  Upon admission he was found to have markedly elevated liver function tests with an AST of 604 ALT of 142 alkaline phosphatase of 227 and a bilirubin of 23.4.  He had icterus abdominal pain and nausea and vomiting.  He developed acute kidney injury likely due to dehydration while in the hospital.  My partner Dr. Luciana Axeomer [facility] [place] and Darrell JewelSymtuza  It was unclear to our team or hepatology what was causing his elevation of his transaminases and bilirubin.  Consideration was given to reactivation of hepatitis B if he had been off of his antiretrovirals but his hepatitis B was actually well controlled.  We still not have his viral load back.  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  His hepatitis C was certainly active as well.  Ultimately his liver function test stabilized and his creatinine improved.  He was discharged to home.  Of note he also developed a pruritic rash on his back and arms.  Since discharge in the hospital he has been vomiting multiple times per day as he does not have any anti-nausea medicines  In talking to him and his grandmother several visits ago and  grandmother disclosed to me that he has been purchasing and by being large amounts of alcohol over the week to 2 weeks prior to his admission. labs in today or not becoming dramatically worse.   We ultimately had him admitted to the hospital of worsening liver failure back in January.  It was very clear that he is not competent to take care of himself and that he would benefit the being in a structured environment.   At that time arrangements were going to be made to try to have him placed in a skilled facility.  However ultimately grandmother mother decided that they could try to increase his personal care hours at home.  That at that time did not wish for placement in a group home or assisted living situation.  Since then [name] is sporadically been seen in the clinic.  He had been complaining of epistaxis and made several appointments with me all of which he had not made until today.  Pharmacy showed me when the last time he had filled his medications with  them and this was in April.  His grandmother did bring in medications that did have pills in the most of them dated April some dated back into November there is no fluconazole present whatsoever.  In the interim months and had developed episodic episodes of epistaxis which concerned his sister and prompted calls to our clinic.  He is also complaining of severely painful lower extremity edema.  In talking to his grandmother she says that months is becoming increasingly aggressive and that no  one will ever visit the house anymore because of how aggressive he is and yelling at them.  I was very concerned about all of these things and also her ability to manage him as an outpatient and in the big scheme of things his ability to be managed by his grandmother.  He was admitted the hospital and consideration for long-term placement was again given but again ultimately decided against.  In the interim his hypokalemia was improved he did have acute kidney injury which seems to be stable though he did not come back to normal.  He is currently on Lasix as well as spironolactone and potassium.  He had not been taking his lactulose which his grandmother thought was supposed to be for nausea, not understanding this was to prevent hepatic encephalopathy  His edema had improved since leaving the hospital.  His grandmother states that [name] has not been drinking alcohol since he got to the hospital and believes he is ready to do better but she believes he also needs more help at home and is asking for an aide to be present to help which I think is reasonable.  In the interim I rechecked labs but Grandmother and [facility] as I had instructed to pick up meds inlcuding [facility]  Apparently Rami is trying to apply for a loan to buy a car. I informed him and his Grandmother that he is NOT to drive a car.  [name] is NOT COMPETENT to take care of himself.   He saw IM today but apparently no followup was made.      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   Bipolar 1 disorder (HCC)    Chronic viral hepatitis B without coma  and with delta agent (HCC)    Cognitive communication deficit    working w/ therapy   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 and  near heriation of brain (required VP shunt)--- residual CNS damage   Depression    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)    sores   Fecal incontinence 11/19/2017   Foley catheter in place    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   cerebral infection   History of cerebral infarction 05/20/2017   multiple ischemic infarction secondary to infection   History of scabies 2013   History of syphilis    Memory deficit after cerebral infarction    due to infection   Nausea and vomiting 01/14/2019   Neurogenic bladder    secondary to damage from brain infection   Neurogenic bowel    wears depends  Neuromuscular disorder (HCC)    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   Seizure disorder [facility])    neurologist-  dr Marjory Lies ([facility])--caused by cryptococcal meningitis/ multiple infarctions--  no seizure since hospitalization May 2018   Status epilepticus [facility])    URI (upper respiratory infection) 02/24/2019   Visuospatial deficit     Past Surgical History:  Procedure Laterality Date   CYSTOSCOPY N/A 10/10/2017   Procedure: CYSTOSCOPY;  Surgeon: Sebastian Ache, MD;  Location: [facility];  Service: Urology;  Laterality: N/A;   EXAMINATION UNDER ANESTHESIA N/A 12/28/2014   Procedure: EXAM UNDER ANESTHESIA;  Surgeon: Karie Soda, MD;  Location: [facility];  Service: General;  Laterality: N/A;   FLOOR OF MOUTH BIOPSY N/A 09/16/2016   Procedure: BIOPSY OF ORAL ABCESS;  Surgeon: Newman Pies, MD;  Location: [facility];  Service: ENT;  Laterality: N/A;   INCISION AND DRAINAGE ABSCESS N/A 09/16/2016   Procedure: INCISION AND DRAINAGE ABSCESS;  Surgeon: Newman Pies, MD;  Location: [facility];  Service: ENT;  Laterality: N/A;   INCISION AND DRAINAGE  PERIRECTAL ABSCESS  09/ 03/ 2011   dr toth   INCISION AND DRAINAGE PERIRECTAL ABSCESS N/A 12/28/2014   Procedure: IRRIGATION AND DEBRIDEMENT PERIRECTAL ABSCESS;  Surgeon: Karie Soda, MD;  Location: [facility];  Service: General;  Laterality: N/A;  Fistula repair and ablation   INSERTION OF SUPRAPUBIC CATHETER N/A 10/10/2017   Procedure: INSERTION OF SUPRAPUBIC CATHETER;  Surgeon: Sebastian Ache, MD;  Location: [facility];  Service: Urology;  Laterality: N/A;   LAPAROSCOPIC REVISION VENTRICULAR-PERITONEAL (V-P) SHUNT N/A 06/12/2017   Procedure: LAPAROSCOPIC REVISION VENTRICULAR-PERITONEAL (V-P) SHUNT;  Surgeon: Kinsinger, De Blanch, MD;  Location: [facility];  Service: General;  Laterality: N/A;   SHUNT REMOVAL N/A 01/01/2018   Procedure: REMOVAL OF V-P SHUNT, PLACEMENT OF EVD;  Surgeon: [name], MD;  Location: [facility];  Service: Neurosurgery;  Laterality: N/A;   TRANSTHORACIC ECHOCARDIOGRAM  06/02/2017   EF 65-70%/  trivial PR   VENTRICULOPERITONEAL SHUNT N/A 06/12/2017   Procedure: SHUNT INSERTION VENTRICULAR-PERITONEAL;  Surgeon: [name], MD;  Location[facility];  Service: Neurosurgery;  Laterality: N/A;    Family History  Problem Relation Age of Onset   Hypertension Other       Social History   Socioeconomic History   Marital status: Single    Spouse name: Not on file   Number of children: Not on file   Years of education: Not on file   Highest education level: Not on file  Occupational History   Not on file  Social Needs   Financial resource strain: Not on file   Food insecurity    Worry: Not on file    Inability: Not on file   Transportation needs    Medical: Not on file    Non-medical: Not on file  Tobacco Use   Smoking status: Current Every Day Smoker    Packs/day: 0.50    Years: 5.00    Pack years: 2.50    Types: Cigarettes   Smokeless tobacco: Never Used   Tobacco comment: cutting back  Substance and Sexual Activity    Alcohol use: Yes   Drug use: No   Sexual activity: Yes    Partners: Male    Birth control/protection: Condom    Comment: irregular condom use; educated  Lifestyle   Physical activity    Days per week: Not on file    Minutes per session: Not on file   Stress: Not on file  Relationships   Social Herbalist on phone: Not on file    Gets together: Not on file    Attends religious service: Not on file    Active member of club or organization: Not on file    Attends meetings of clubs or organizations: Not on file    Relationship status: Not on file  Other Topics Concern   Not on file  Social History Narrative   Not on file    Allergies  Allergen Reactions   Acyclovir And Related Itching     Current Outpatient Medications:    Darunavir-Cobicisctat-Emtricitabine-Tenofovir Alafenamide (SYMTUZA) 800-150-200-10 MG TABS, Take 1 tablet by mouth daily with breakfast., Disp: 30 tablet, Rfl: 11   dolutegravir (TIVICAY) 50 MG tablet, Take 1 tablet (50 mg total) by mouth daily., Disp: 30 tablet, Rfl: 11   furosemide (LASIX) 20 MG tablet, Take 1 tablet (20 mg total) by mouth daily as needed for fluid or edema (leg swellings). Take extra dose of potassium if you take this medication, Disp: 30 tablet, Rfl: 1   lactulose (CHRONULAC) 10 GM/15ML solution, Take 30 mLs (20 g total) by mouth 3 (three) times daily., Disp: 236 mL, Rfl: 0   levETIRAcetam (KEPPRA) 1000 MG tablet, Take 1 tablet (1,000 mg total) by mouth 2 (two) times daily., Disp: 60 tablet, Rfl: 11   Magnesium Oxide 200 MG TABS, Take 1 tablet (200 mg total) by mouth daily., Disp: 30 tablet, Rfl: 0   ondansetron (ZOFRAN) 4 MG tablet, Take 1 tablet (4 mg total) by mouth every 6 (six) hours as needed for nausea or vomiting. (Patient taking differently: Take 4 mg by mouth every 6 (six) hours as needed for nausea or vomiting. ), Disp: 120 tablet, Rfl: 2   potassium chloride SA (K-DUR) 20 MEQ tablet, Take 1 tablet (20 mEq  total) by mouth daily., Disp: 30 tablet, Rfl: 1   QUEtiapine (SEROQUEL) 25 MG tablet, Take 1 tablet (25 mg total) by mouth 2 (two) times daily as needed (agitation)., Disp: 60 tablet, Rfl: 11   spironolactone (ALDACTONE) 25 MG tablet, Take 1 tablet (25 mg total) by mouth 2 (two) times daily., Disp: 30 tablet, Rfl: 1   sulfamethoxazole-trimethoprim (BACTRIM) 400-80 MG tablet, Take 1 tablet by mouth daily., Disp: 30 tablet, Rfl: 3   valACYclovir (VALTREX) 1000 MG tablet, Take 1 tablet (1,000 mg total) by mouth every Monday, Wednesday, and Friday., Disp: 30 tablet, Rfl: 1   fluconazole (DIFLUCAN) 200 MG tablet, Take 1 tablet (200 mg total) by mouth daily. (Patient not taking: Reported on 06/25/2019), Disp: 30 tablet, Rfl: 11   potassium chloride 20 MEQ TBCR, Take 20 mEq by mouth daily for 15 days., Disp: 15 tablet, Rfl: 0   Review of Systems  Unable to perform ROS: Dementia       Objective:   Physical Exam Constitutional:      General: He is not in acute distress.    Appearance: He is not diaphoretic.  HENT:     Head: Normocephalic.   Eyes:     General: Scleral icterus present.        Right eye: No discharge.  Neck:     Musculoskeletal: Normal range of motion.     Vascular: No JVD.     Trachea: No tracheal deviation.  Cardiovascular:     Rate and Rhythm: Normal rate and regular rhythm.  Pulmonary:     Effort: Pulmonary effort is normal. No respiratory distress.     Breath sounds:  No wheezing.  Abdominal:     Palpations: Abdomen is soft.  Musculoskeletal: Normal range of motion.        General: No deformity.     Right lower leg: Edema present.     Left lower leg: Edema present.  Skin:    General: Skin is warm and dry.     Findings: Rash present. No erythema.  Neurological:     General: No focal deficit present.     Mental Status: He is alert.     Cranial Nerves: No cranial nerve deficit.     Sensory: No sensory deficit.     Gait: Gait normal.     Comments: Oriented to  person and place and more oriented than in the past  Psychiatric:        Mood and Affect: Mood is depressed.        Speech: Speech is delayed.        Behavior: Behavior is slowed.        Cognition and Memory: Cognition is impaired. Memory is impaired.        Judgment: Judgment is impulsive.    Edema has improved since he was in the hospital.     Assessment & Plan:    HIV disease: Continue TIVICAY on Symtuza.  He is to GO with Grandmother to [facility] to get ALL of the meds He should continue on Bactrim for PCP prophylaxis and should resetart fluconazole given his history of cryptococcal meningitis  Hypokalemia: recheck labs today keeping in mind he stopped taking KCL  Cryptococcal meningitis: restart  his fluconazole if LFTs not terribly high  Chronic hepatitis B without delta agent: His hep B viral load was only 20   Chronic hepatitis C: Would like to treat this at some point but is hard to interpret his liver function test and his fiber sure is in the context of these fulminant flares of hepatitis'   Fibrosis of the liver : superimposed alcoholic hepatitis on top of his chronic hep B and hepatitis C: Would like to have him seen by gastroenterology and referred him to Dr. Leone PayorGessner.  In the interim he is resume his lactulose.  Dementia: Audrick is 100% NOT competent to take care of himself. Before he had bipolar and was depressed and was non adherent but now he does not have awareness or judgement to make decisions for himself  I need to investigate if his grandmother did indeed become his guardian. I believe she is receiving checks  I am going to ask Ambre to visit [place] and to investigate  I spent greater than 25 minutes with the patient including greater than 50% of time in face to face counsel of the patient's surrgoate decision maker his Grandmother re his care, medciations and that Elster should not be taking out loans or driving  and in coordination of his care.

## 2019-06-25 NOTE — Progress Notes (Signed)
Referral placed to outreach RN Ambre for medication adherence. Marland Kitchen

## 2019-06-25 NOTE — Telephone Encounter (Signed)
Opened in error

## 2019-06-25 NOTE — Assessment & Plan Note (Signed)
Last episode of seizure was about a year ago, per Justin Ball and his grandmother.  -Continue Keppra 1000 mg BID

## 2019-06-25 NOTE — Progress Notes (Signed)
Internal Medicine Clinic Attending  Case discussed with Dr. Masoudi  at the time of the visit.  We reviewed the resident's history and exam and pertinent patient test results.  I agree with the assessment, diagnosis, and plan of care documented in the resident's note.  

## 2019-06-25 NOTE — Telephone Encounter (Signed)
Not sure what I am supposed to do about this.  Is there option to call the DMV?  Ensure he does not have access to keys

## 2019-06-25 NOTE — Progress Notes (Signed)
Established Patient Office Visit  Subjective:  Patient ID: Justin Ball, male    DOB: 01/20/1991  Age: 28 y.o. MRN: [id number]  CC:  Chief Complaint  Patient presents with  . Establish Care for chronic liver failure    HPI Justin SpurrLinston R Folk is a 28 year old male with PMHx documented below, presents for establishing care.  Please refer to problem based charting for further details and assessment and plan.  Past Medical History:  Diagnosis Date  . Aggressive behavior 06/02/2019  . 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 AKI (acute kidney injury) (HCC) 06/19/2019  . Alcoholic hepatitis 01/14/2019  . Anxiety   . 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 Bipolar 1 disorder (HCC)   . 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 Cognitive communication deficit    working w/ therapy  . 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 Depression   . 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 Fecal incontinence 11/19/2017  . Foley catheter in place   . 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 History of scabies 2013  . History of syphilis   . 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 Nausea and vomiting 01/14/2019  . 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 Neurogenic bowel    wears depends  . Neuromuscular disorder (HCC)   . 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 Seizure disorder [facility](HCC)    neurologist-  dr Marjory Liespenumalli ([facility])--caused by cryptococcal meningitis/  multiple infarctions--  no seizure since hospitalization May 2018  . Status epilepticus (HCC)   . URI (upper respiratory infection) 02/24/2019  . Visuospatial deficit     Past Surgical History:  Procedure Laterality Date  . CYSTOSCOPY N/A 10/10/2017   Procedure: CYSTOSCOPY;  Surgeon: Sebastian AcheManny, Theodore, MD;  [facility];  Service: Urology;  Laterality: N/A;  . EXAMINATION UNDER ANESTHESIA N/A 12/28/2014   Procedure: EXAM UNDER ANESTHESIA;  Surgeon: Karie SodaSteven Gross, MD;  Location: [facility];  Service: General;  Laterality: N/A;  . FLOOR OF MOUTH BIOPSY N/A 09/16/2016   Procedure: BIOPSY OF ORAL ABCESS;  Surgeon: Newman PiesSu Teoh, MD;  Location: [facility];  Service: ENT;  Laterality: N/A;  . INCISION AND DRAINAGE ABSCESS N/A 09/16/2016   Procedure: INCISION AND DRAINAGE ABSCESS;  Surgeon: Newman PiesSu Teoh, MD;  Location: [facility];  Service: ENT;  Laterality: N/A;  . INCISION AND DRAINAGE PERIRECTAL ABSCESS  09/ 03/ 2011   dr toth  . INCISION AND DRAINAGE PERIRECTAL ABSCESS N/A 12/28/2014   Procedure: IRRIGATION AND DEBRIDEMENT PERIRECTAL ABSCESS;  Surgeon: Karie SodaSteven Gross, MD;  Location: [facility];  Service: General;  Laterality: N/A;  Fistula repair and ablation  . INSERTION OF SUPRAPUBIC CATHETER N/A 10/10/2017   Procedure: INSERTION OF SUPRAPUBIC CATHETER;  Surgeon: Sebastian AcheManny, Theodore, MD;  [facility] ;  Service: Urology;  Laterality: N/A;  . LAPAROSCOPIC REVISION VENTRICULAR-PERITONEAL (V-P) SHUNT N/A 06/12/2017   Procedure: LAPAROSCOPIC REVISION VENTRICULAR-PERITONEAL (V-P) SHUNT;  Surgeon: Rodman PickleKinsinger, Luke Aaron,  MD;  Location: [place];  Service: General;  Laterality: N/A;  . SHUNT REMOVAL N/A 01/01/2018   Procedure: REMOVAL OF V-P SHUNT, PLACEMENT OF EVD;  Surgeon: Consuella Lose, MD;  Location: [place];  Service: Neurosurgery;  Laterality: N/A;  . TRANSTHORACIC ECHOCARDIOGRAM  06/02/2017   EF 65-70%/  trivial PR  . VENTRICULOPERITONEAL SHUNT N/A 06/12/2017   Procedure: SHUNT INSERTION  VENTRICULAR-PERITONEAL;  Surgeon: Consuella Lose, MD;  Location: [place];  Service: Neurosurgery;  Laterality: N/A;    Family History  Problem Relation Age of Onset  . Hypertension Other    Social Hx:  Mr. Hammerschmidt lives in [place] with his grandmother  He smokes 3-4 cigarettes daily He does not use illicit drug  Drinks a bottle of wine a day   Social History   Socioeconomic History  . Marital status: Single    Spouse name: Not on file  . Number of children: Not on file  . Years of education: Not on file  . Highest education level: Not on file  Occupational History  . Not on file  Social Needs  . Financial resource strain: Not on file  . Food insecurity    Worry: Not on file    Inability: Not on file  . Transportation needs    Medical: Not on file    Non-medical: Not on file  Tobacco Use  . Smoking status: Current Every Day Smoker    Packs/day: 0.50    Years: 5.00    Pack years: 2.50    Types: Cigarettes  . Smokeless tobacco: Never Used  . Tobacco comment: cutting back  Substance and Sexual Activity  . Alcohol use: Yes  . Drug use: No  . Sexual activity: Yes    Partners: Male    Birth control/protection: Condom    Comment: irregular condom use; educated  Lifestyle  . Physical activity    Days per week: Not on file    Minutes per session: Not on file  . Stress: Not on file  Relationships  . Social Herbalist on phone: Not on file    Gets together: Not on file    Attends religious service: Not on file    Active member of club or organization: Not on file    Attends meetings of clubs or organizations: Not on file    Relationship status: Not on file  . Intimate partner violence    Fear of current or ex partner: Not on file    Emotionally abused: Not on file    Physically abused: Not on file    Forced sexual activity: Not on file  Other Topics Concern  . Not on file  Social History Narrative  . Not on file    Outpatient Medications Prior to  Visit  Medication Sig Dispense Refill  . Darunavir-Cobicisctat-Emtricitabine-Tenofovir Alafenamide (SYMTUZA) 800-150-200-10 MG TABS Take 1 tablet by mouth daily with breakfast. 30 tablet 11  . dolutegravir (TIVICAY) 50 MG tablet Take 1 tablet (50 mg total) by mouth daily. 30 tablet 11  . fluconazole (DIFLUCAN) 200 MG tablet Take 1 tablet (200 mg total) by mouth daily. 30 tablet 11  . furosemide (LASIX) 20 MG tablet Take 1 tablet (20 mg total) by mouth daily as needed for fluid or edema (leg swellings). Take extra dose of potassium if you take this medication 30 tablet 1  . lactulose (CHRONULAC) 10 GM/15ML solution Take 30 mLs (20 g total) by mouth 3 (three) times daily. (Patient not taking: Reported  on 06/19/2019) 236 mL 0  . levETIRAcetam (KEPPRA) 1000 MG tablet Take 1 tablet (1,000 mg total) by mouth 2 (two) times daily. 60 tablet 11  . Magnesium Oxide 200 MG TABS Take 1 tablet (200 mg total) by mouth daily. 30 tablet 0  . ondansetron (ZOFRAN) 4 MG tablet Take 1 tablet (4 mg total) by mouth every 6 (six) hours as needed for nausea or vomiting. (Patient taking differently: Take 4 mg by mouth every 6 (six) hours as needed for nausea or vomiting. ) 120 tablet 2  . potassium chloride 20 MEQ TBCR Take 20 mEq by mouth daily for 15 days. 15 tablet 0  . potassium chloride SA (K-DUR) 20 MEQ tablet Take 1 tablet (20 mEq total) by mouth daily. 30 tablet 1  . QUEtiapine (SEROQUEL) 25 MG tablet Take 1 tablet (25 mg total) by mouth 2 (two) times daily as needed (agitation). 60 tablet 11  . spironolactone (ALDACTONE) 25 MG tablet Take 1 tablet (25 mg total) by mouth 2 (two) times daily. 30 tablet 1  . sulfamethoxazole-trimethoprim (BACTRIM) 400-80 MG tablet Take 1 tablet by mouth daily. 30 tablet 3  . valACYclovir (VALTREX) 1000 MG tablet Take 1 tablet (1,000 mg total) by mouth every Monday, Wednesday, and Friday. 30 tablet 1   No facility-administered medications prior to visit.     Allergies  Allergen  Reactions  . Acyclovir And Related Itching    ROS Review of Systems    Objective:    Physical Exam  Constitutional: He is oriented to person, place, and time. No distress.  HENT:  Head: Atraumatic.  Eyes: Scleral icterus is present.  Cardiovascular:  Murmur heard. Pulmonary/Chest: Effort normal and breath sounds normal. He has no wheezes. He has no rales.  Abdominal: Bowel sounds are normal. He exhibits distension. There is no abdominal tenderness. There is no guarding.  Musculoskeletal:        General: Edema present. No tenderness.  Neurological: He is alert and oriented to person, place, and time.  Skin: Skin is warm.  Vitals reviewed.   BP 127/70 (BP Location: Right Arm, Patient Position: Sitting, Cuff Size: Normal)   Pulse (!) 109   Temp 99.7 F (37.6 C) (Oral)   Ht 6\' 1"  (1.854 m)   Wt 188 lb 3.2 oz (85.4 kg)   SpO2 100% Comment: room air  BMI 24.83 kg/m  Wt Readings from Last 3 Encounters:  06/25/19 188 lb 3.2 oz (85.4 kg)  06/19/19 179 lb (81.2 kg)  06/07/19 179 lb 14.4 oz (81.6 kg)   Physical Exam  Constitutional: He is oriented to person, place, and time. No distress.  HENT:  Head: Atraumatic.  Eyes: Scleral icterus is present.  Cardiovascular:  Murmur heard. Pulmonary/Chest: Effort normal and breath sounds normal. He has no wheezes. He has no rales.  Abdominal: Bowel sounds are normal. He exhibits distension. There is no abdominal tenderness. There is no guarding.  Musculoskeletal:        General: Edema present. No tenderness.  Neurological: He is alert and oriented to person, place, and time.  Skin: Skin is warm.  Vitals reviewed.   Health Maintenance Due  Topic Date Due  . TETANUS/TDAP  02/27/2010    There are no preventive care reminders to display for this patient.  Lab Results  Component Value Date   TSH 2.743 06/03/2019   Lab Results  Component Value Date   WBC 4.5 06/19/2019   HGB 9.3 (L) 06/19/2019   HCT 24.2 (L) 06/19/2019  MCV 94.5 06/19/2019   PLT 104 (L) 06/19/2019   Lab Results  Component Value Date   NA 130 (L) 06/19/2019   K 3.6 06/19/2019   CO2 20 (L) 06/19/2019   GLUCOSE 102 (H) 06/19/2019   BUN 10 06/19/2019   CREATININE 1.32 (H) 06/19/2019   BILITOT 9.9 (H) 06/19/2019   ALKPHOS 159 (H) 06/19/2019   AST 196 (H) 06/19/2019   ALT 83 (H) 06/19/2019   PROT 10.1 (H) 06/19/2019   ALBUMIN 2.0 (L) 06/19/2019   CALCIUM 8.8 (L) 06/19/2019   ANIONGAP 7 06/19/2019   Lab Results  Component Value Date   CHOL 125 09/28/2016   Lab Results  Component Value Date   HDL 31 (L) 09/28/2016   Lab Results  Component Value Date   LDLCALC 70 09/28/2016   Lab Results  Component Value Date   TRIG 110 01/17/2018   Lab Results  Component Value Date   CHOLHDL 4.0 09/28/2016   No results found for: HGBA1C    Assessment & Plan:   Problem List Items Addressed This Visit    None      No orders of the defined types were placed in this encounter.   Follow-up: Return in about 4 weeks (around 07/23/2019) for follow up in 1-2 months.    Chevis PrettyElhamalsadat Jameis Newsham, MD

## 2019-06-25 NOTE — Patient Instructions (Signed)
PLEASE PICK UP ALL MEDICATIONS AT [facility]  I WILL CALL [facility] TO ENGAGE WITH YOU  WE WILL GET LABS TODAY  YOU SHOULD NOT ATTEMPT TO OPERATE A MOTOR VEHICLE OR OBTAIN A LOAN  PLEASE DO NOT DRINK ANY ALCOHOL

## 2019-06-25 NOTE — Patient Instructions (Addendum)
Thank you for allowing [place] taking care of you at [facility] and would like to establish care with [place]. As we discussed it is very important to cut back on alcohol as it is very dangerous for your liver.  I am glad that you are trying to join a support group for that.  We can also provide you further resources. Please make sure to take all of your medications as instructed for you by infectious disease doctor and by the doctors that discharged you from hospital. Bring your medications on next visit so we can review them together. Make sure to go to your appointment with GI doctor tomorrow. Please return to clinic in 1 month.  Thank you

## 2019-06-25 NOTE — Assessment & Plan Note (Addendum)
Justin Ball was seen at [facility] today to establish care.  He has chronic liver failure likely in setting of Hep B and Hep C and superimposed alcohol hepatitis. He had a recent hospitalization due to lower extremity edema and decompensated cirrhosis.  He denies amy complaint today.  Reports compliant to his medications.  (However has not brought his medication made him in clinic today to be reviewed) On exam: Slight scleral icterus, abdominal distention (last CT scan 06/03/2019 showed small amount of free fluid in the abdomen. (No ascites reported), he has 1+ bilateral lower extremity edema up to knees. -Recommeding high Pr diet -Continue 20 mg of Lasix as needed and 50 mg of spironolactone -He has a GI referral and reminded him to go to the appointment tomorrow. (for Hep B Hep c follow up, evaluate for EGD, discussion regarding probable future need for liver transplant) -Discussed the importance of cutting down on the alcohol. -Continue lactulose until having 2-3 bowel movement per day. -Return to clinic within 1-2 months. Asked to bring medications with him for next visit -Continue follow up with ID

## 2019-06-26 ENCOUNTER — Encounter: Payer: Self-pay | Admitting: Internal Medicine

## 2019-06-26 ENCOUNTER — Telehealth: Payer: Self-pay | Admitting: *Deleted

## 2019-06-26 ENCOUNTER — Ambulatory Visit (INDEPENDENT_AMBULATORY_CARE_PROVIDER_SITE_OTHER): Payer: Medicaid Other | Admitting: Internal Medicine

## 2019-06-26 DIAGNOSIS — Z9114 Patient's other noncompliance with medication regimen: Secondary | ICD-10-CM

## 2019-06-26 DIAGNOSIS — K7290 Hepatic failure, unspecified without coma: Secondary | ICD-10-CM | POA: Diagnosis not present

## 2019-06-26 DIAGNOSIS — K7010 Alcoholic hepatitis without ascites: Secondary | ICD-10-CM

## 2019-06-26 DIAGNOSIS — B182 Chronic viral hepatitis C: Secondary | ICD-10-CM | POA: Diagnosis not present

## 2019-06-26 DIAGNOSIS — K7682 Hepatic encephalopathy: Secondary | ICD-10-CM | POA: Insufficient documentation

## 2019-06-26 DIAGNOSIS — K7201 Acute and subacute hepatic failure with coma: Secondary | ICD-10-CM | POA: Diagnosis not present

## 2019-06-26 HISTORY — DX: Hepatic encephalopathy: K76.82

## 2019-06-26 HISTORY — DX: Hepatic failure, unspecified without coma: K72.90

## 2019-06-26 LAB — T-HELPER CELL (CD4) - (RCID CLINIC ONLY)
CD4 % Helper T Cell: 11 % — ABNORMAL LOW (ref 33–65)
CD4 T Cell Abs: 110 /uL — ABNORMAL LOW (ref 400–1790)

## 2019-06-26 MED ORDER — RIFAXIMIN 550 MG PO TABS: 550.0000 mg | ORAL_TABLET | Freq: Two times a day (BID) | ORAL | 11 refills | Status: AC

## 2019-06-26 MED ORDER — LACTULOSE 10 GM/15ML PO SOLN: 20.0000 g | Freq: Three times a day (TID) | ORAL | 11 refills | Status: DC

## 2019-06-26 MED FILL — LACTULOSE 10 GM/15 ML SOLUT: 10 | 31 days supply | Qty: 2838 | Fill #0

## 2019-06-26 NOTE — Progress Notes (Signed)
Completed. Visit for today offered but grandmother asked for a visit on Monday. Visit has been scheduled.

## 2019-06-26 NOTE — Telephone Encounter (Signed)
Thanks [facility], Make sure they picked up the meds from [facility] too  We need to find out guardian ship and pursue that if not done

## 2019-06-26 NOTE — Progress Notes (Signed)
TELEHEALTH ENCOUNTER IN SETTING OF COVID-19 PANDEMIC - REQUESTED BY PATIENT SERVICE PROVIDED BY TELEMEDECINE - TYPE: telephone PATIENT LOCATION: Home PATIENT HAS CONSENTED TO TELEHEALTH VISIT PROVIDER LOCATION: OFFICE REFERRING PROVIDER:Dr. Tommy Medal PARTICIPANTS OTHER THAN PATIENT:Grandmother TIME SPENT ON CALL 22 mins    RENEE BEALE 28 y.o. 1991/10/31 [id number]  Assessment & Plan:   Encounter Diagnoses  Name Primary?   Acute liver failure with hepatic coma (HCC)    Chronic hepatitis C with hepatic coma (HCC)    Alcoholic hepatitis with ascites    Encephalopathy, hepatic (HCC)    Non compliance w medication regimen      Orders Placed This Encounter  Procedures   Ammonia   Hepatitis B DNA, ultraquantitative, PCR   HCV RNA quant   INR/PT   Comp Met (CMET)   Meds ordered this encounter  Medications   rifaximin (XIFAXAN) 550 MG TABS tablet    Sig: Take 1 tablet (550 mg total) by mouth 2 (two) times daily.    Dispense:  60 tablet    Refill:  11   lactulose (CHRONULAC) 10 GM/15ML solution    Sig: Take 30 mLs (20 g total) by mouth 3 (three) times daily.    Dispense:  2838 mL    Refill:  11   This is a very complicated situation with a significantly ill young man who has a main barrier of improving that is lack of adherence to recommended treatments for his multiple illnesses.  His neurologic deficits and mental illness must play a significant role.  If it were not for his grandmother he be in much worse trouble.  He has advanced liver disease and clinical features compatible with cirrhosis so that has not shown up on imaging.  His best hope regarding this is is that some of this is still reversible.  At this point liver disease is manifested by biochemical abnormalities, slight ascites/mesenteric edema, and significant hepatic encephalopathy.  We do not have evidence for vascular complications of portal hypertension i.e. varices. No splenomegaly. This is good  news.  I have prescribed Xifaxan today to add to his treatment of hepatic encephalopathy.  We might be able to reduce lactulose dose if this is able to be prescribed through his formulary.  I have refilled his lactulose.  He will come for lab testing next week.  I am concerned about the elevated INR.  He is advised not to drink at all.  He will need further assessment based upon the lab results.  I fear his prognosis is guarded at best.  Most of the conversation was with his grandmother though he was engaged and involved to some degree.   I do not think liver biopsy would change anything here.  Perhaps an upper endoscopy to look for varices.  Starting on treatment for hepatitis C if medically feasible is important.  Continuing antivirals for HIV and HBV important as well.  Staying off alcohol is of paramount importance.  Subjective:   Chief Complaint: Liver disease/cirrhosis  HPI The patient is a 28 year old African-American man with multiple problems that include HIV, hepatitis C, and hepatitis B with alcohol abuse.  This is led to liver disease manifested by transaminitis, hepatic encephalopathy and some ascites.  He has had major periods of poor and no adherence to treatment.  He is followed by Dr. Drucilla Schmidt of infectious disease and Dr. Gilford Rile in the internal medicine clinic.  He lives with his grandmother.  She participates in leads most of the  discussion today.  We had difficulties connecting and could not do an audiovisual visit as planned as the patient had a new phone and could not give me the exact phone number so we used grandmom's phone which did not have video capability.  Currently Justin Ball is improved with respect to encephalopathy because he has been taking his lactulose and not sleeping so much according to his grandmother.  He does not have lower extremity edema or abdominal distention.  He has not been drinking though I do not know for how long.  Apparently when he goes off with the  wrong crowd is when he gets into trouble with alcohol and medication nonadherence He saw Dr. Drucilla Schmidt the other day and was advised not to drive or make decisions based upon his encephalopathy problems.  On that July 1 visit, and also his internal medicine visit, his hemoglobin was 9.5 and platelets 125.  RPR negative.  His CD4 count was 110.  Albumin 2.4, bilirubin 8.8 down from 9.9 several days ago, alk phos 154 AST 173 and ALT 64.  About 10 days ago his HIV RNA was elevated at 46 copies per milliliter.  Ammonia 111 3 weeks ago at the tail end of a hospitalization.  His last INR 2.3 at the end of that hospitalization as well.  4 months ago it was 1.2.  His liver fibrosis score calculates out to an F4. CT scan of the abdomen and pelvis on June 9 because of nausea and vomiting, demonstrated cholelithiasis, a normal-appearing liver otherwise, mild gallbladder wall thickening and mesenteric edema.  Some free fluid as well.   He has never had an upper GI endoscopy.  I do not think he has had major GI bleeding.  3 hospital admissions in the first 6 months of this year 2 in January 1 in June.  He has significant behavioral issues associated with previous strokes related to infections, and also has bipolar disorder.  He refuses to take his lactulose at times because it causes an upset stomach.  It goes better if he mixes it with some juice according to his grandmother.  In January HBV DNA was suppressed 20 international units/mL.  He is E antigen positive.  In January hepatitis C RNA markedly positive. Allergies  Allergen Reactions   Acyclovir And Related Itching    Current Outpatient Medications:    Darunavir-Cobicisctat-Emtricitabine-Tenofovir Alafenamide (SYMTUZA) 800-150-200-10 MG TABS, Take 1 tablet by mouth daily with breakfast., Disp: 30 tablet, Rfl: 11   dolutegravir (TIVICAY) 50 MG tablet, Take 1 tablet (50 mg total) by mouth daily., Disp: 30 tablet, Rfl: 11   furosemide (LASIX) 20 MG tablet,  Take 1 tablet (20 mg total) by mouth daily as needed for fluid or edema (leg swellings). Take extra dose of potassium if you take this medication, Disp: 30 tablet, Rfl: 1   lactulose (CHRONULAC) 10 GM/15ML solution, Take 30 mLs (20 g total) by mouth 3 (three) times daily., Disp: 2838 mL, Rfl: 11   levETIRAcetam (KEPPRA) 1000 MG tablet, Take 1 tablet (1,000 mg total) by mouth 2 (two) times daily., Disp: 60 tablet, Rfl: 11   Magnesium Oxide 200 MG TABS, Take 1 tablet (200 mg total) by mouth daily., Disp: 30 tablet, Rfl: 0   ondansetron (ZOFRAN) 4 MG tablet, Take 1 tablet (4 mg total) by mouth every 6 (six) hours as needed for nausea or vomiting. (Patient taking differently: Take 4 mg by mouth every 6 (six) hours as needed for nausea or vomiting. ), Disp: 120  tablet, Rfl: 2   potassium chloride SA (K-DUR) 20 MEQ tablet, Take 1 tablet (20 mEq total) by mouth daily., Disp: 30 tablet, Rfl: 1   QUEtiapine (SEROQUEL) 25 MG tablet, Take 1 tablet (25 mg total) by mouth 2 (two) times daily as needed (agitation)., Disp: 60 tablet, Rfl: 11   spironolactone (ALDACTONE) 25 MG tablet, Take 1 tablet (25 mg total) by mouth 2 (two) times daily., Disp: 30 tablet, Rfl: 1   sulfamethoxazole-trimethoprim (BACTRIM) 400-80 MG tablet, Take 1 tablet by mouth daily., Disp: 30 tablet, Rfl: 3   valACYclovir (VALTREX) 1000 MG tablet, Take 1 tablet (1,000 mg total) by mouth every Monday, Wednesday, and Friday., Disp: 30 tablet, Rfl: 1  I believe he is taking these meds as best I can tell through am not certain    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--- dr Lucianne Lei dam   CD4=50 on 08-02-2017   AKI (acute kidney injury) ([place]) 06/07/4314   Alcoholic hepatitis 4/00/8676   Anxiety    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   Bipolar 1  disorder ([place])    Chronic hepatitis C with hepatic coma ([place])    Chronic viral hepatitis B without coma and with delta agent ([place])    Cognitive communication deficit    working w/ therapy   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 and  near heriation of brain (required VP shunt)--- residual CNS damage   Depression    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[place])    sores   Encephalopathy, hepatic ([place]) 06/26/2019   Fecal incontinence 11/19/2017   Foley catheter in place    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   cerebral infection   History of cerebral infarction 05/20/2017   multiple ischemic infarction secondary to infection   History of scabies 2013   History of syphilis    Memory deficit after cerebral infarction    due to infection   Nausea and vomiting 01/14/2019   Neurogenic bladder    secondary to damage from brain infection   Neurogenic bowel    wears depends   Neuromuscular disorder ([place])    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   Seizure disorder [facility])    neurologist-  dr Leta Baptist ([facility])--caused by cryptococcal meningitis/ multiple infarctions--  no seizure since hospitalization May 2018   Status epilepticus ([place])    URI (upper respiratory infection) 02/24/2019   Visuospatial deficit    Past Surgical History:  Procedure Laterality Date   CYSTOSCOPY N/A 10/10/2017   Procedure: CYSTOSCOPY;  Surgeon: Alexis Frock, MD;  Location: [facility];  Service: Urology;  Laterality: N/A;   EXAMINATION UNDER ANESTHESIA N/A 12/28/2014   Procedure: EXAM UNDER ANESTHESIA;  Surgeon: Michael Boston, MD;  Location: [facility];  Service: General;  Laterality: N/A;   FLOOR OF MOUTH BIOPSY N/A 09/16/2016   Procedure: BIOPSY OF ORAL ABCESS;  Surgeon: Leta Baptist, MD;  Location: [facility];  Service: ENT;  Laterality: N/A;   INCISION AND DRAINAGE ABSCESS N/A 09/16/2016    Procedure: INCISION AND DRAINAGE ABSCESS;  Surgeon: Leta Baptist, MD;  Location: [facility];  Service: ENT;  Laterality: N/A;   [place]  09/ 03/ 2011   dr toth   INCISION AND DRAINAGE PERIRECTAL ABSCESS N/A 12/28/2014   Procedure: IRRIGATION  AND DEBRIDEMENT PERIRECTAL ABSCESS;  Surgeon: Michael Boston, MD;  Location: [facility];  Service: General;  Laterality: N/A;  Fistula repair and ablation   INSERTION OF SUPRAPUBIC CATHETER N/A 10/10/2017   Procedure: INSERTION OF SUPRAPUBIC CATHETER;  Surgeon: Alexis Frock, MD;  Location: [facility];  Service: Urology;  Laterality: N/A;   LAPAROSCOPIC REVISION VENTRICULAR-PERITONEAL (V-P) SHUNT N/A 06/12/2017   Procedure: LAPAROSCOPIC REVISION VENTRICULAR-PERITONEAL (V-P) SHUNT;  Surgeon: Kinsinger, Arta Bruce, MD;  Location: [facility];  Service: General;  Laterality: N/A;   SHUNT REMOVAL N/A 01/01/2018   Procedure: REMOVAL OF V-P SHUNT, PLACEMENT OF EVD;  Surgeon: Consuella Lose, MD;  Location: [facility];  Service: Neurosurgery;  Laterality: N/A;   TRANSTHORACIC ECHOCARDIOGRAM  06/02/2017   EF 65-70%/  trivial PR   VENTRICULOPERITONEAL SHUNT N/A 06/12/2017   Procedure: SHUNT INSERTION VENTRICULAR-PERITONEAL;  Surgeon: Consuella Lose, MD;  Location: [facility];  Service: Neurosurgery;  Laterality: N/A;   Social History   Social History Narrative   He is he is single, no children listed, he is unemployed and is on [place] and Medicare.   Lives with his grandmother.   Male sexual partners   Alcohol use off and on   Cigarette smoker, not a vapor, no other drug use reported      family history includes Hypertension in an other family member.   Review of Systems As per HPI.  As best I can tell it is otherwise negative.  Physical exam is not possible   Data reviewed includes extensive reviews of laboratory testing imaging, infectious disease, primary care and hospital records including personal review of images.

## 2019-06-26 NOTE — Patient Instructions (Signed)
Please come to the lab in the basement of our building at [address] on Monday, July 6 and have blood work drawn.   I sent prescriptions to refill lactulose and a new prescription for Xifaxan to the [facility] long pharmacy to be picked up today.   Once I get the lab results I will arrange further follow-up.  It is extremely important that you take all of your medications as prescribed, and do not drink alcohol.  I agree with Dr. Tommy Medal that you should not drive a car and you should not take out alone or make any important decisions until your health improves and your confusion and thinking clear up.  I appreciate the opportunity to care for you. Gatha Mayer, MD, [facility]

## 2019-06-26 NOTE — Assessment & Plan Note (Signed)
Tx

## 2019-06-26 NOTE — Assessment & Plan Note (Signed)
A major issue on a recurrent basis and significant barrier to health Mental illness, prior strokes and resultant deficits play a role making overcoming this a challenge. Without his grandmother as support would be much worse.

## 2019-06-26 NOTE — Assessment & Plan Note (Signed)
?   Cirrhosis Advised no EtOH

## 2019-06-26 NOTE — Assessment & Plan Note (Addendum)
Continue Lactulose Rx Xifaxan Hope it is approved

## 2019-06-26 NOTE — Assessment & Plan Note (Signed)
HBV, HCV and EtOH

## 2019-06-26 NOTE — Telephone Encounter (Signed)
Spoke with Justin Ball's grandmother today and offered a home visit for today to organize and fill a pillbox for [name]. Ms. Justin Ball denied a visit today stating it's her birthday and asked me to come out on Monday around 1:30pm. Visit scheduled for Monday.

## 2019-06-28 ENCOUNTER — Encounter: Payer: Self-pay | Admitting: Internal Medicine

## 2019-06-30 ENCOUNTER — Ambulatory Visit: Payer: Self-pay | Admitting: *Deleted

## 2019-06-30 VITALS — BP 110/52

## 2019-06-30 DIAGNOSIS — R17 Unspecified jaundice: Secondary | ICD-10-CM

## 2019-06-30 DIAGNOSIS — B20 Human immunodeficiency virus [HIV] disease: Secondary | ICD-10-CM

## 2019-07-02 ENCOUNTER — Telehealth: Payer: Self-pay | Admitting: *Deleted

## 2019-07-02 NOTE — Telephone Encounter (Signed)
Thank you! Thank you!

## 2019-07-02 NOTE — Telephone Encounter (Signed)
During my home visit the patient has all of the medications in the home with the exception of   rifaximin (XIFAXAN) 550 MG TABS tablet    Sig: Take 1 tablet (550 mg total) by mouth 2 (two) times daily.    Dispense:  60 tablet    Refill:  11   Patient's grandmother stated the cost of the medication is 2500 per month and they will need assistance to cover the cost. Reaching out the [place] to see if we can offer any assistance.

## 2019-07-02 NOTE — Telephone Encounter (Signed)
Here is the Cover My Meds information from [facility] that [name] would need to complete in order for Justin Ball to receive at the normal $3.00 copay amount. [employer] indicates that with proper documentation from the provider, approval rate is highly favorable. [name] RX# [id number] [name])   [name] [employer] Call-In Form call payer [phone] to initiate the remainder of the authorization.  -Hepatic Encephalopathy -Documented History of intolerance, failure or contraindication to Lactulose -Prescribed by gastroenterologist

## 2019-07-03 ENCOUNTER — Ambulatory Visit: Payer: Medicaid Other

## 2019-07-04 ENCOUNTER — Encounter (HOSPITAL_COMMUNITY): Payer: Self-pay | Admitting: Emergency Medicine

## 2019-07-04 ENCOUNTER — Other Ambulatory Visit (HOSPITAL_COMMUNITY): Payer: Medicaid Other

## 2019-07-04 ENCOUNTER — Inpatient Hospital Stay (HOSPITAL_COMMUNITY)
Admission: EM | Admit: 2019-07-04 | Discharge: 2019-07-07 | DRG: 442 | Disposition: A | Discharge status: Home / Self Care | Payer: Medicaid Other | Attending: Internal Medicine | Admitting: Internal Medicine

## 2019-07-04 ENCOUNTER — Inpatient Hospital Stay (HOSPITAL_COMMUNITY): Payer: Medicaid Other

## 2019-07-04 ENCOUNTER — Other Ambulatory Visit: Payer: Self-pay

## 2019-07-04 ENCOUNTER — Emergency Department (HOSPITAL_COMMUNITY): Payer: Medicaid Other

## 2019-07-04 DIAGNOSIS — M25572 Pain in left ankle and joints of left foot: Secondary | ICD-10-CM | POA: Diagnosis present

## 2019-07-04 DIAGNOSIS — M25571 Pain in right ankle and joints of right foot: Secondary | ICD-10-CM | POA: Diagnosis present

## 2019-07-04 DIAGNOSIS — K7460 Unspecified cirrhosis of liver: Secondary | ICD-10-CM

## 2019-07-04 DIAGNOSIS — G40909 Epilepsy, unspecified, not intractable, without status epilepticus: Secondary | ICD-10-CM

## 2019-07-04 DIAGNOSIS — K7291 Hepatic failure, unspecified with coma: Principal | ICD-10-CM | POA: Diagnosis present

## 2019-07-04 DIAGNOSIS — Z79899 Other long term (current) drug therapy: Secondary | ICD-10-CM

## 2019-07-04 DIAGNOSIS — Z91128 Patient's intentional underdosing of medication regimen for other reason: Secondary | ICD-10-CM

## 2019-07-04 DIAGNOSIS — Z8661 Personal history of infections of the central nervous system: Secondary | ICD-10-CM

## 2019-07-04 DIAGNOSIS — Z20828 Contact with and (suspected) exposure to other viral communicable diseases: Secondary | ICD-10-CM | POA: Diagnosis present

## 2019-07-04 DIAGNOSIS — T473X6A Underdosing of saline and osmotic laxatives, initial encounter: Secondary | ICD-10-CM | POA: Diagnosis present

## 2019-07-04 DIAGNOSIS — N179 Acute kidney failure, unspecified: Secondary | ICD-10-CM | POA: Diagnosis not present

## 2019-07-04 DIAGNOSIS — G9340 Encephalopathy, unspecified: Secondary | ICD-10-CM | POA: Diagnosis not present

## 2019-07-04 DIAGNOSIS — K7290 Hepatic failure, unspecified without coma: Secondary | ICD-10-CM | POA: Diagnosis not present

## 2019-07-04 DIAGNOSIS — K7011 Alcoholic hepatitis with ascites: Secondary | ICD-10-CM | POA: Diagnosis present

## 2019-07-04 DIAGNOSIS — K7682 Hepatic encephalopathy: Secondary | ICD-10-CM | POA: Diagnosis present

## 2019-07-04 DIAGNOSIS — B180 Chronic viral hepatitis B with delta-agent: Secondary | ICD-10-CM | POA: Diagnosis present

## 2019-07-04 DIAGNOSIS — R41842 Visuospatial deficit: Secondary | ICD-10-CM | POA: Diagnosis present

## 2019-07-04 DIAGNOSIS — I69311 Memory deficit following cerebral infarction: Secondary | ICD-10-CM

## 2019-07-04 DIAGNOSIS — F319 Bipolar disorder, unspecified: Secondary | ICD-10-CM

## 2019-07-04 DIAGNOSIS — Z8249 Family history of ischemic heart disease and other diseases of the circulatory system: Secondary | ICD-10-CM

## 2019-07-04 DIAGNOSIS — Y92018 Other place in single-family (private) house as the place of occurrence of the external cause: Secondary | ICD-10-CM

## 2019-07-04 DIAGNOSIS — M7989 Other specified soft tissue disorders: Secondary | ICD-10-CM

## 2019-07-04 DIAGNOSIS — R41840 Attention and concentration deficit: Secondary | ICD-10-CM | POA: Diagnosis present

## 2019-07-04 DIAGNOSIS — F17210 Nicotine dependence, cigarettes, uncomplicated: Secondary | ICD-10-CM | POA: Diagnosis present

## 2019-07-04 DIAGNOSIS — R609 Edema, unspecified: Secondary | ICD-10-CM

## 2019-07-04 DIAGNOSIS — K7010 Alcoholic hepatitis without ascites: Secondary | ICD-10-CM | POA: Diagnosis present

## 2019-07-04 DIAGNOSIS — B182 Chronic viral hepatitis C: Secondary | ICD-10-CM

## 2019-07-04 DIAGNOSIS — Z792 Long term (current) use of antibiotics: Secondary | ICD-10-CM | POA: Diagnosis not present

## 2019-07-04 DIAGNOSIS — E876 Hypokalemia: Secondary | ICD-10-CM

## 2019-07-04 DIAGNOSIS — Z8619 Personal history of other infectious and parasitic diseases: Secondary | ICD-10-CM

## 2019-07-04 DIAGNOSIS — K7031 Alcoholic cirrhosis of liver with ascites: Secondary | ICD-10-CM | POA: Diagnosis present

## 2019-07-04 DIAGNOSIS — N319 Neuromuscular dysfunction of bladder, unspecified: Secondary | ICD-10-CM | POA: Diagnosis present

## 2019-07-04 DIAGNOSIS — Z720 Tobacco use: Secondary | ICD-10-CM

## 2019-07-04 DIAGNOSIS — Z888 Allergy status to other drugs, medicaments and biological substances status: Secondary | ICD-10-CM

## 2019-07-04 DIAGNOSIS — B181 Chronic viral hepatitis B without delta-agent: Secondary | ICD-10-CM | POA: Diagnosis not present

## 2019-07-04 DIAGNOSIS — K592 Neurogenic bowel, not elsewhere classified: Secondary | ICD-10-CM | POA: Diagnosis present

## 2019-07-04 DIAGNOSIS — I959 Hypotension, unspecified: Secondary | ICD-10-CM | POA: Diagnosis not present

## 2019-07-04 DIAGNOSIS — R600 Localized edema: Secondary | ICD-10-CM | POA: Diagnosis present

## 2019-07-04 DIAGNOSIS — Z21 Asymptomatic human immunodeficiency virus [HIV] infection status: Secondary | ICD-10-CM

## 2019-07-04 DIAGNOSIS — R188 Other ascites: Secondary | ICD-10-CM | POA: Diagnosis not present

## 2019-07-04 DIAGNOSIS — B20 Human immunodeficiency virus [HIV] disease: Secondary | ICD-10-CM | POA: Diagnosis not present

## 2019-07-04 DIAGNOSIS — E7220 Disorder of urea cycle metabolism, unspecified: Secondary | ICD-10-CM

## 2019-07-04 LAB — CBC WITH DIFFERENTIAL/PLATELET
Abs Immature Granulocytes: 0.02 10*3/uL (ref 0.00–0.07)
Basophils Absolute: 0 10*3/uL (ref 0.0–0.1)
Basophils Relative: 1 %
Eosinophils Absolute: 0.1 10*3/uL (ref 0.0–0.5)
Eosinophils Relative: 1 %
HCT: 25.1 % — ABNORMAL LOW (ref 39.0–52.0)
Hemoglobin: 8.8 g/dL — ABNORMAL LOW (ref 13.0–17.0)
Immature Granulocytes: 1 %
Lymphocytes Relative: 44 %
Lymphs Abs: 1.9 10*3/uL (ref 0.7–4.0)
MCH: 35.1 pg — ABNORMAL HIGH (ref 26.0–34.0)
MCHC: 35.1 g/dL (ref 30.0–36.0)
MCV: 100 fL (ref 80.0–100.0)
Monocytes Absolute: 0.4 10*3/uL (ref 0.1–1.0)
Monocytes Relative: 10 %
Neutro Abs: 1.9 10*3/uL (ref 1.7–7.7)
Neutrophils Relative %: 43 %
Platelets: 103 10*3/uL — ABNORMAL LOW (ref 150–400)
RBC: 2.51 MIL/uL — ABNORMAL LOW (ref 4.22–5.81)
RDW: 16 % — ABNORMAL HIGH (ref 11.5–15.5)
WBC: 4.3 10*3/uL (ref 4.0–10.5)
nRBC: 0 % (ref 0.0–0.2)

## 2019-07-04 LAB — SODIUM, URINE, RANDOM: Sodium, Ur: 81 mmol/L

## 2019-07-04 LAB — PROTIME-INR
INR: 2 — ABNORMAL HIGH (ref 0.8–1.2)
Prothrombin Time: 22.4 seconds — ABNORMAL HIGH (ref 11.4–15.2)

## 2019-07-04 LAB — URINALYSIS, ROUTINE W REFLEX MICROSCOPIC
Glucose, UA: NEGATIVE mg/dL
Hgb urine dipstick: NEGATIVE
Ketones, ur: NEGATIVE mg/dL
Leukocytes,Ua: NEGATIVE
Nitrite: NEGATIVE
Protein, ur: NEGATIVE mg/dL
Specific Gravity, Urine: 1.015 (ref 1.005–1.030)
pH: 6 (ref 5.0–8.0)

## 2019-07-04 LAB — COMPREHENSIVE METABOLIC PANEL
ALT: 61 U/L — ABNORMAL HIGH (ref 0–44)
AST: 155 U/L — ABNORMAL HIGH (ref 15–41)
Albumin: 1.8 g/dL — ABNORMAL LOW (ref 3.5–5.0)
Alkaline Phosphatase: 177 U/L — ABNORMAL HIGH (ref 38–126)
Anion gap: 8 (ref 5–15)
BUN: 7 mg/dL (ref 6–20)
CO2: 19 mmol/L — ABNORMAL LOW (ref 22–32)
Calcium: 8.1 mg/dL — ABNORMAL LOW (ref 8.9–10.3)
Chloride: 108 mmol/L (ref 98–111)
Creatinine, Ser: 1.61 mg/dL — ABNORMAL HIGH (ref 0.61–1.24)
GFR calc Af Amer: >60 mL/min (ref >60)
GFR calc non Af Amer: 57 mL/min — ABNORMAL LOW (ref >60)
Glucose, Bld: 69 mg/dL — ABNORMAL LOW (ref 70–99)
Potassium: 3.3 mmol/L — ABNORMAL LOW (ref 3.5–5.1)
Sodium: 135 mmol/L (ref 135–145)
Total Bilirubin: 7.9 mg/dL — ABNORMAL HIGH (ref 0.3–1.2)
Total Protein: 9.3 g/dL — ABNORMAL HIGH (ref 6.5–8.1)

## 2019-07-04 LAB — RAPID URINE DRUG SCREEN, HOSP PERFORMED
Amphetamines: NOT DETECTED
Barbiturates: NOT DETECTED
Benzodiazepines: NOT DETECTED
Cocaine: NOT DETECTED
Opiates: NOT DETECTED
Tetrahydrocannabinol: NOT DETECTED

## 2019-07-04 LAB — GLUCOSE, CAPILLARY: Glucose-Capillary: 72 mg/dL (ref 70–99)

## 2019-07-04 LAB — AMMONIA: Ammonia: 69 umol/L — ABNORMAL HIGH (ref 9–35)

## 2019-07-04 LAB — MAGNESIUM: Magnesium: 1.6 mg/dL — ABNORMAL LOW (ref 1.7–2.4)

## 2019-07-04 LAB — SARS CORONAVIRUS 2 BY RT PCR (HOSPITAL ORDER, PERFORMED IN ~~LOC~~ HOSPITAL LAB): SARS Coronavirus 2: NEGATIVE

## 2019-07-04 LAB — CREATININE, URINE, RANDOM: Creatinine, Urine: 170.09 mg/dL

## 2019-07-04 MED ORDER — FUROSEMIDE 20 MG PO TABS
20.0000 mg | ORAL_TABLET | Freq: Every day | ORAL | Status: DC
  Administered 2019-07-04 – 2019-07-07 (×4): 20 mg via ORAL
  Filled 2019-07-04 (×4): qty 1

## 2019-07-04 MED ORDER — LEVETIRACETAM 500 MG PO TABS
1000.0000 mg | ORAL_TABLET | Freq: Two times a day (BID) | ORAL | Status: DC
  Administered 2019-07-04 – 2019-07-07 (×7): 1000 mg via ORAL
  Filled 2019-07-04 (×7): qty 2

## 2019-07-04 MED ORDER — ACETAMINOPHEN 650 MG RE SUPP: 650.0000 mg | Freq: Four times a day (QID) | RECTAL | Status: DC | PRN

## 2019-07-04 MED ORDER — LACTATED RINGERS IV BOLUS: 1000.0000 mL | Freq: Once | INTRAVENOUS | Status: DC

## 2019-07-04 MED ORDER — ACETAMINOPHEN 325 MG PO TABS
650.0000 mg | ORAL_TABLET | Freq: Four times a day (QID) | ORAL | Status: DC | PRN
  Administered 2019-07-05: 04:00:00 650 mg via ORAL
  Filled 2019-07-04: qty 2

## 2019-07-04 MED ORDER — DARUN-COBIC-EMTRICIT-TENOFAF 800-150-200-10 MG PO TABS
1.0000 | ORAL_TABLET | Freq: Every day | ORAL | Status: DC
  Administered 2019-07-05 – 2019-07-07 (×3): 1 via ORAL
  Filled 2019-07-04 (×4): qty 1

## 2019-07-04 MED ORDER — HEPARIN SODIUM (PORCINE) 5000 UNIT/ML IJ SOLN
5000.0000 [IU] | Freq: Three times a day (TID) | INTRAMUSCULAR | Status: DC
  Administered 2019-07-04 – 2019-07-05 (×2): 5000 [IU] via SUBCUTANEOUS
  Filled 2019-07-04 (×4): qty 1

## 2019-07-04 MED ORDER — POTASSIUM CHLORIDE 10 MEQ/100ML IV SOLN
10.0000 meq | INTRAVENOUS | Status: AC
  Administered 2019-07-04 (×4): 10 meq via INTRAVENOUS
  Filled 2019-07-04 (×4): qty 100

## 2019-07-04 MED ORDER — RIFAXIMIN 550 MG PO TABS
550.0000 mg | ORAL_TABLET | Freq: Two times a day (BID) | ORAL | Status: DC
  Administered 2019-07-04 – 2019-07-07 (×7): 550 mg via ORAL
  Filled 2019-07-04 (×7): qty 1

## 2019-07-04 MED ORDER — DOLUTEGRAVIR SODIUM 50 MG PO TABS
50.0000 mg | ORAL_TABLET | Freq: Every day | ORAL | Status: DC
  Administered 2019-07-04 – 2019-07-07 (×4): 50 mg via ORAL
  Filled 2019-07-04 (×4): qty 1

## 2019-07-04 MED ORDER — SULFAMETHOXAZOLE-TRIMETHOPRIM 400-80 MG PO TABS
1.0000 | ORAL_TABLET | Freq: Every day | ORAL | Status: DC
  Administered 2019-07-05 – 2019-07-07 (×3): 1 via ORAL
  Filled 2019-07-04 (×4): qty 1

## 2019-07-04 MED ORDER — QUETIAPINE FUMARATE 25 MG PO TABS
25.0000 mg | ORAL_TABLET | Freq: Two times a day (BID) | ORAL | Status: DC | PRN
  Administered 2019-07-04 – 2019-07-06 (×3): 25 mg via ORAL
  Filled 2019-07-04 (×3): qty 1

## 2019-07-04 MED ORDER — OXYCODONE HCL 5 MG PO TABS
5.0000 mg | ORAL_TABLET | Freq: Once | ORAL | Status: AC
  Administered 2019-07-04: 5 mg via ORAL
  Filled 2019-07-04: qty 1

## 2019-07-04 MED ORDER — FLUCONAZOLE 100 MG PO TABS
200.0000 mg | ORAL_TABLET | Freq: Every day | ORAL | Status: DC
  Administered 2019-07-05 – 2019-07-07 (×3): 200 mg via ORAL
  Filled 2019-07-04 (×4): qty 2

## 2019-07-04 MED ORDER — RIFAXIMIN 550 MG PO TABS: 550.0000 mg | ORAL_TABLET | Freq: Two times a day (BID) | ORAL | Status: DC

## 2019-07-04 MED ORDER — VALACYCLOVIR HCL 500 MG PO TABS
1000.0000 mg | ORAL_TABLET | ORAL | Status: DC
  Administered 2019-07-04 – 2019-07-07 (×2): 1000 mg via ORAL
  Filled 2019-07-04 (×4): qty 2

## 2019-07-04 MED ORDER — SENNOSIDES-DOCUSATE SODIUM 8.6-50 MG PO TABS
1.0000 | ORAL_TABLET | Freq: Every evening | ORAL | Status: DC | PRN
  Filled 2019-07-04: qty 1

## 2019-07-04 MED ORDER — FUROSEMIDE 20 MG PO TABS: 20.0000 mg | ORAL_TABLET | Freq: Every day | ORAL | Status: DC | PRN

## 2019-07-04 MED ORDER — LACTULOSE 10 GM/15ML PO SOLN
30.0000 g | Freq: Once | ORAL | Status: AC
  Administered 2019-07-04: 30 g via ORAL
  Filled 2019-07-04: qty 45

## 2019-07-04 MED ORDER — LACTULOSE 10 GM/15ML PO SOLN
20.0000 g | Freq: Three times a day (TID) | ORAL | Status: DC
  Administered 2019-07-04 – 2019-07-05 (×3): 20 g via ORAL
  Filled 2019-07-04 (×3): qty 30

## 2019-07-04 NOTE — Plan of Care (Signed)
  Problem: Pain Managment: Goal: General experience of comfort will improve Outcome: Not Progressing  Patient c/o pain. Sleeping when pain meds to be given. Will monitor.

## 2019-07-04 NOTE — ED Notes (Signed)
ED TO INPATIENT HANDOFF REPORT  ED Nurse Name and Phone #: [phone]  S Name/Age/Gender Justin Ball 28 y.o. male Room/Bed: 022C/022C  Code Status   Code Status: Prior  Home/SNF/Other Home Patient oriented to: self, place, time and situation Is this baseline? Yes   Triage Complete: Triage complete  Chief Complaint leg pain  Triage Note Patient with bilateral leg swelling that started two weeks ago.  He states that they are very painful.  Patient states that they have been bleeding also.  Patient has neuromuscular disorder and HIV.     Allergies Allergies  Allergen Reactions  . Acyclovir And Related Itching    Level of Care/Admitting Diagnosis ED Disposition    ED Disposition Condition Comment   Admit  The patient appears reasonably stabilized for admission considering the current resources, flow, and capabilities available in the ED at this time, and I doubt any other [facility] requiring further screening and/or treatment in the ED prior to admission is  present.       B Medical/Surgery History Past Medical History:  Diagnosis Date  . Aggressive behavior 06/02/2019  . 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 AKI (acute kidney injury) (HCC) 06/19/2019  . Alcoholic hepatitis 01/14/2019  . Anxiety   . 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 Bipolar 1 disorder (HCC)   . Chronic hepatitis C with hepatic coma (HCC)   . 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 Cognitive communication deficit    working w/ therapy  . 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 Depression   . 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 Encephalopathy, hepatic (HCC) 06/26/2019  .  Fecal incontinence 11/19/2017  . Foley catheter in place   . 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 History of scabies 2013  . History of syphilis   . 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 Nausea and vomiting 01/14/2019  . 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 Neurogenic bowel    wears depends  . Neuromuscular disorder (HCC)   . 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 Seizure disorder [facility](HCC)    neurologist-  dr Marjory Liespenumalli ([facility] [facility])--caused by cryptococcal meningitis/ multiple infarctions--  no seizure since hospitalization May 2018  . Status epilepticus (HCC)   . URI (upper respiratory infection) 02/24/2019  . Visuospatial deficit    Past Surgical History:  Procedure Laterality Date  . CYSTOSCOPY N/A 10/10/2017   Procedure: CYSTOSCOPY;  Surgeon: Sebastian AcheManny, Theodore, MD;  [facility] ;  Service: Urology;  Laterality: N/A;  . EXAMINATION UNDER ANESTHESIA N/A 12/28/2014   Procedure: EXAM UNDER ANESTHESIA;  Surgeon: Karie SodaSteven Gross, MD;  Location: [facility];  Service: General;  Laterality: N/A;  . FLOOR OF MOUTH BIOPSY N/A 09/16/2016   Procedure: BIOPSY OF ORAL ABCESS;  Surgeon: Newman PiesSu Teoh, MD;  Location[facility];  Service: ENT;  Laterality: N/A;  . INCISION AND DRAINAGE ABSCESS N/A 09/16/2016   Procedure: INCISION AND DRAINAGE ABSCESS;  Surgeon: Newman PiesSu Teoh,  MD;  Location: [place];  Service: ENT;  Laterality: N/A;  . [place]  09/ 03/ 2011   dr toth  . INCISION AND DRAINAGE PERIRECTAL ABSCESS N/A 12/28/2014   Procedure: IRRIGATION AND DEBRIDEMENT PERIRECTAL ABSCESS;  Surgeon: Michael Boston, MD;  [facility];  Service: General;  Laterality: N/A;  Fistula repair and ablation  . INSERTION OF SUPRAPUBIC CATHETER N/A 10/10/2017   Procedure:  INSERTION OF SUPRAPUBIC CATHETER;  Surgeon: Alexis Frock, MD;  Location: [facility];  Service: Urology;  Laterality: N/A;  . LAPAROSCOPIC REVISION VENTRICULAR-PERITONEAL (V-P) SHUNT N/A 06/12/2017   Procedure: LAPAROSCOPIC REVISION VENTRICULAR-PERITONEAL (V-P) SHUNT;  Surgeon: Kinsinger, Arta Bruce, MD;  Location: [place];  Service: General;  Laterality: N/A;  . SHUNT REMOVAL N/A 01/01/2018   Procedure: REMOVAL OF V-P SHUNT, PLACEMENT OF EVD;  Surgeon: Consuella Lose, MD;  Location: [place];  Service: Neurosurgery;  Laterality: N/A;  . TRANSTHORACIC ECHOCARDIOGRAM  06/02/2017   EF 65-70%/  trivial PR  . VENTRICULOPERITONEAL SHUNT N/A 06/12/2017   Procedure: SHUNT INSERTION VENTRICULAR-PERITONEAL;  Surgeon: Consuella Lose, MD;  Location: [place];  Service: Neurosurgery;  Laterality: N/A;     A IV Location/Drains/Wounds Patient Lines/Drains/Airways Status   Active Line/Drains/Airways    Name:   Placement date:   Placement time:   Site:   Days:   Peripheral IV 06/07/19 Left;Anterior Forearm   06/07/19    1630    Forearm   27   Peripheral IV 07/04/19 Right Antecubital   07/04/19    0542    Antecubital   less than 1   Suprapubic Catheter Non-latex 18 Fr.   10/10/17    1121    Non-latex   632   Incision (Closed) 01/01/18 Head Right   01/01/18    1947     549   Incision - 3 Ports Abdomen 1: Umbilicus 2: Right;Medial;Lateral 3: Superior   06/12/17    1324     752          Intake/Output Last 24 hours No intake or output data in the 24 hours ending 07/04/19 0616  Labs/Imaging Results for orders placed or performed during the hospital encounter of 07/04/19 (from the past 48 hour(s))  CBC with Differential     Status: Abnormal   Collection Time: 07/04/19  4:12 AM  Result Value Ref Range   WBC 4.3 4.0 - 10.5 K/uL   RBC 2.51 (L) 4.22 - 5.81 MIL/uL   Hemoglobin 8.8 (L) 13.0 - 17.0 g/dL   HCT 25.1 (L) 39.0 - 52.0 %   MCV 100.0 80.0 - 100.0 fL   MCH 35.1 (H) 26.0 - 34.0 pg    MCHC 35.1 30.0 - 36.0 g/dL   RDW 16.0 (H) 11.5 - 15.5 %   Platelets 103 (L) 150 - 400 K/uL    Comment: REPEATED TO VERIFY Immature Platelet Fraction may be clinically indicated, consider ordering this additional test [id number] CONSISTENT WITH PREVIOUS RESULT    nRBC 0.0 0.0 - 0.2 %   Neutrophils Relative % 43 %   Neutro Abs 1.9 1.7 - 7.7 K/uL   Lymphocytes Relative 44 %   Lymphs Abs 1.9 0.7 - 4.0 K/uL   Monocytes Relative 10 %   Monocytes Absolute 0.4 0.1 - 1.0 K/uL   Eosinophils Relative 1 %   Eosinophils Absolute 0.1 0.0 - 0.5 K/uL   Basophils Relative 1 %   Basophils Absolute 0.0 0.0 - 0.1 K/uL   WBC Morphology  MORPHOLOGY UNREMARKABLE    Immature Granulocytes 1 %   Abs Immature Granulocytes 0.02 0.00 - 0.07 K/uL    Comment: Performed at [facility], [address] [place], [place] [address]  Comprehensive metabolic panel     Status: Abnormal   Collection Time: 07/04/19  4:12 AM  Result Value Ref Range   Sodium 135 135 - 145 mmol/L   Potassium 3.3 (L) 3.5 - 5.1 mmol/L   Chloride 108 98 - 111 mmol/L   CO2 19 (L) 22 - 32 mmol/L   Glucose, Bld 69 (L) 70 - 99 mg/dL   BUN 7 6 - 20 mg/dL   Creatinine, Ser 6.041.61 (H) 0.61 - 1.24 mg/dL   Calcium 8.1 (L) 8.9 - 10.3 mg/dL   Total Protein 9.3 (H) 6.5 - 8.1 g/dL   Albumin 1.8 (L) 3.5 - 5.0 g/dL   AST 540155 (H) 15 - 41 U/L   ALT 61 (H) 0 - 44 U/L   Alkaline Phosphatase 177 (H) 38 - 126 U/L   Total Bilirubin 7.9 (H) 0.3 - 1.2 mg/dL   GFR calc non Af Amer 57 (L) >60 mL/min   GFR calc Af Amer >60 >60 mL/min   Anion gap 8 5 - 15    Comment: Performed at [facility], [address] [place], [place] [address]  Protime-INR     Status: Abnormal   Collection Time: 07/04/19  4:12 AM  Result Value Ref Range   Prothrombin Time 22.4 (H) 11.4 - 15.2 seconds   INR 2.0 (H) 0.8 - 1.2    Comment: (NOTE) INR goal varies based on device and disease states. Performed at [facility], [address] [place], [place] [address]     Dg Chest Portable 1 View  Result Date: 07/04/2019 CLINICAL DATA:  Bilateral lower extremity pain and swelling. EXAM: PORTABLE CHEST 1 VIEW COMPARISON:  01/08/2019 FINDINGS: The heart size and mediastinal contours are within normal limits. Both lungs are clear. The visualized skeletal structures are unremarkable. IMPRESSION: Normal exam.  Ankle Electronically Signed   By: Francene BoyersJames  Maxwell M.D.   On: 07/04/2019 05:46    Pending Labs Unresulted Labs (From admission, onward)    Start     Ordered   07/04/19 0559  Rapid urine drug screen (hospital performed)  ONCE - STAT,   STAT     07/04/19 0558   07/04/19 0512  Novel Coronavirus,NAA,(SEND-OUT TO REF LAB - TAT 24-48 hrs); Hosp Order  (Asymptomatic Patients Labs)  Once,   STAT    Question:  Rule Out  Answer:  Yes   07/04/19 0511   07/04/19 0511  Urinalysis, Routine w reflex microscopic  Once,   STAT     07/04/19 0511   07/04/19 0511  Ammonia  ONCE - STAT,   STAT     07/04/19 0511          Vitals/Pain Today's Vitals   07/04/19 0341 07/04/19 0347 07/04/19 0435  BP: 119/71    Pulse: (!) 108    Resp: 18    Temp: 97.9 F (36.6 C)    TempSrc: Oral    SpO2: 100%    PainSc:  10-Worst pain ever 10-Worst pain ever    Isolation Precautions No active isolations  Medications Medications - No data to display  Mobility non-ambulatory Low fall risk   Focused Assessments  R Recommendations: See Admitting Provider Note  Report given to:   Additional Notes:

## 2019-07-04 NOTE — ED Notes (Signed)
Pt using his phone to contact his family

## 2019-07-04 NOTE — Patient Instructions (Signed)
RN met with client and or caregiver for assessment. RN reviewed Agency Services, [place] Notice of Privacy Policy, Home Safety Management Information Booklet. Home Fire Safety Assessment, Fall Risk Assessment and Suicide Risk Assessment was performed. RN also discussed information on a Living Will, Advanced Directives, and Health Care Power of Attorney. RN and Client/Designated Party educated/reviewed/signed Client Agreement and Consent for Service form along with Patient Rights and Responsibilities statement. RN developed patient specific and centered care plan. RN provided contact information and reviewed how to receive emergency help after hours for schedule changes, billing questions, reporting of safety issues, falls, concerns or any needs/questions. Standard Precaution and Infection control along with interventions to correct or prevent high risk behaviors instructed to the patient. Client/Caregiver reports understanding and agreement with the above 

## 2019-07-04 NOTE — ED Provider Notes (Signed)
[facility] EMERGENCY DEPARTMENT Provider Note   CSN: [id number] Arrival date & time: 07/04/19  0335     History   Chief Complaint Chief Complaint  Patient presents with  . Leg Swelling    HPI Justin Ball is a 28 y.o. male.     The history is provided by the patient. The history is limited by the condition of the patient.  Illness Location:  Legs  Quality:  Swelling and pain Severity:  Severe Onset quality:  Gradual Timing:  Constant Progression:  Worsening Chronicity:  Chronic Context:  HIV and liver disease  Relieved by:  Nothing Worsened by:  Nothing  Ineffective treatments:  None tried Associated symptoms: no abdominal pain, no chest pain, no cough, no fever, no headaches, no loss of consciousness and no shortness of breath   Risk factors:  HIV and liver failure   Past Medical History:  Diagnosis Date  . Aggressive behavior 06/02/2019  . 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 AKI (acute kidney injury) ([place]) 06/19/2019  . Alcoholic hepatitis 8/52/7782  . Anxiety   . 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 Bipolar 1 disorder ([place])   . Chronic hepatitis C with hepatic coma ([place])   . Chronic viral hepatitis B without coma and with delta agent ([place])   . Cognitive communication deficit    working w/ therapy  . 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 Depression   . 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 Encephalopathy, hepatic ([place]) 06/26/2019  . Fecal incontinence 11/19/2017  . Foley catheter in place   . 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 History of cerebral infarction  05/20/2017   multiple ischemic infarction secondary to infection  . History of scabies 2013  . History of syphilis   . 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 Nausea and vomiting 01/14/2019  . 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 Neurogenic bowel    wears depends  . Neuromuscular disorder ([place])   . 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 Seizure disorder [facility])    neurologist-  dr Leta Baptist ([place] [facility])--caused by cryptococcal meningitis/ multiple infarctions--  no seizure since hospitalization May 2018  . Status epilepticus ([place])   . URI (upper respiratory infection) 02/24/2019  . Visuospatial deficit     Patient Active Problem List   Diagnosis Date Noted  . Encephalopathy, hepatic ([place]) 06/26/2019  . Acute CHF (congestive heart failure) ([place]) 06/02/2019  . Epistaxis 06/02/2019  . Liver failure ([place]) 01/15/2019  . Alcoholic hepatitis 43/15/4008  . Anxiety   . Jaundice 01/08/2019  . Perianal ulcer ([place]) 11/14/2018  . Chronic hepatitis C with hepatic coma ([place])   . Non compliance w medication regimen   . Prolonged Q-T interval on ECG   . Seizure disorder ([place]) 09/12/2017  . 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 Chronic viral hepatitis B without coma and with delta agent ([place])   . Tobacco abuse   . History of cryptococcal meningitis   . 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 Hypokalemia 09/12/2016  . Anemia 09/12/2016  . 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 CYSTOSCOPY N/A 10/10/2017   Procedure: CYSTOSCOPY;  Surgeon: Sebastian Ache, MD;  [facility];  Service: Urology;  Laterality: N/A;  . EXAMINATION UNDER ANESTHESIA N/A 12/28/2014   Procedure: EXAM UNDER ANESTHESIA;  Surgeon: Karie Soda, MD;  [facility];  Service: General;  Laterality: N/A;  . FLOOR OF MOUTH BIOPSY N/A 09/16/2016   Procedure: BIOPSY OF ORAL ABCESS;  Surgeon: Newman Pies, MD;   [facility];  Service: ENT;  Laterality: N/A;  . INCISION AND DRAINAGE ABSCESS N/A 09/16/2016   Procedure: INCISION AND DRAINAGE ABSCESS;  Surgeon: Newman Pies, MD;  [facility];  Service: ENT;  Laterality: N/A;  . INCISION AND DRAINAGE PERIRECTAL ABSCESS  09/ 03/ 2011   dr toth  . INCISION AND DRAINAGE PERIRECTAL ABSCESS N/A 12/28/2014   Procedure: IRRIGATION AND DEBRIDEMENT PERIRECTAL ABSCESS;  Surgeon: Karie Soda, MD;  [facility];  Service: General;  Laterality: N/A;  Fistula repair and ablation  . INSERTION OF SUPRAPUBIC CATHETER N/A 10/10/2017   Procedure: INSERTION OF SUPRAPUBIC CATHETER;  Surgeon: Sebastian Ache, MD;  [facility];  Service: Urology;  Laterality: N/A;  . LAPAROSCOPIC REVISION VENTRICULAR-PERITONEAL (V-P) SHUNT N/A 06/12/2017   Procedure: LAPAROSCOPIC REVISION VENTRICULAR-PERITONEAL (V-P) SHUNT;  Surgeon: Kinsinger, De Blanch, MD;  [facility];  Service: General;  Laterality: N/A;  . SHUNT REMOVAL N/A 01/01/2018   Procedure: REMOVAL OF V-P SHUNT, PLACEMENT OF EVD;  Surgeon: [name], MD;  [facility];  Service: Neurosurgery;  Laterality: N/A;  . TRANSTHORACIC ECHOCARDIOGRAM  06/02/2017   EF 65-70%/  trivial PR  . VENTRICULOPERITONEAL SHUNT N/A 06/12/2017   Procedure: SHUNT INSERTION VENTRICULAR-PERITONEAL;  Surgeon: [name], MD;  [facility];  Service: Neurosurgery;  Laterality: N/A;        Home Medications    Prior to Admission medications   Medication Sig Start Date End Date Taking? Authorizing Provider  Darunavir-Cobicisctat-Emtricitabine-Tenofovir Alafenamide [facility]) 800-150-200-10 MG TABS Take 1 tablet by mouth daily with breakfast. 03/25/19   [name], Lisette Grinder, MD  dolutegravir (TIVICAY) 50 MG tablet Take 1 tablet (50 mg total) by mouth daily. 03/25/19   Randall Hiss, MD  furosemide (LASIX) 20 MG tablet Take 1 tablet (20 mg total) by mouth daily as needed for fluid or edema (leg  swellings). Take extra dose of potassium if you take this medication 06/08/19 06/07/20  Alessandra Bevels, MD  lactulose (CHRONULAC) 10 GM/15ML solution Take 30 mLs (20 g total) by mouth 3 (three) times daily. 06/26/19   Iva Boop, MD  levETIRAcetam (KEPPRA) 1000 MG tablet Take 1 tablet (1,000 mg total) by mouth 2 (two) times daily. 03/25/19   Randall Hiss, MD  Magnesium Oxide 200 MG TABS Take 1 tablet (200 mg total) by mouth daily. 06/08/19   Alessandra Bevels, MD  ondansetron (ZOFRAN) 4 MG tablet Take 1 tablet (4 mg total) by mouth every 6 (six) hours as needed for nausea or vomiting. Patient taking differently: Take 4 mg by mouth every 6 (six) hours as needed for nausea or vomiting.  03/25/19   [name], Lisette Grinder, MD  potassium chloride SA (K-DUR) 20 MEQ tablet Take 1 tablet (20 mEq total) by mouth daily. 06/19/19   Randall Hiss, MD  QUEtiapine (SEROQUEL) 25 MG tablet Take 1 tablet (25 mg total) by mouth 2 (two) times daily as needed (agitation). 03/25/19  Daiva EvesVan Dam, Lisette Grinderornelius N, MD  rifaximin (XIFAXAN) 550 MG TABS tablet Take 1 tablet (550 mg total) by mouth 2 (two) times daily. 06/26/19   Iva BoopGessner, Carl E, MD  spironolactone (ALDACTONE) 25 MG tablet Take 1 tablet (25 mg total) by mouth 2 (two) times daily. 06/08/19   Alessandra BevelsKamineni, Neelima, MD  sulfamethoxazole-trimethoprim (BACTRIM) 400-80 MG tablet Take 1 tablet by mouth daily. 06/05/19   Blanchard Kelchixon, Stephanie N, NP  valACYclovir (VALTREX) 1000 MG tablet Take 1 tablet (1,000 mg total) by mouth every Monday, Wednesday, and Friday. 06/09/19   Alessandra BevelsKamineni, Neelima, MD    Family History Family History  Problem Relation Age of Onset  . Hypertension Other     Social History Social History   Tobacco Use  . Smoking status: Current Every Day Smoker    Packs/day: 0.50    Years: 5.00    Pack years: 2.50    Types: Cigarettes  . Smokeless tobacco: Never Used  . Tobacco comment: cutting back  Substance Use Topics  . Alcohol use: Yes  . Drug use:  No     Allergies   Acyclovir and related   Review of Systems Review of Systems  Constitutional: Negative for fever.  Respiratory: Negative for cough and shortness of breath.   Cardiovascular: Positive for leg swelling. Negative for chest pain.  Gastrointestinal: Negative for abdominal pain.  Neurological: Negative for loss of consciousness and headaches.     Physical Exam Updated Vital Signs BP 119/71 (BP Location: Left Arm)   Pulse (!) 108   Temp 97.9 F (36.6 C) (Oral)   Resp 18   SpO2 100%   Physical Exam Constitutional:      Appearance: He is normal weight. He is not toxic-appearing.  HENT:     Head: Normocephalic and atraumatic.     Nose: Nose normal.     Mouth/Throat:     Mouth: Mucous membranes are moist.     Pharynx: Oropharynx is clear.  Eyes:     Conjunctiva/sclera: Conjunctivae normal.     Pupils: Pupils are equal, round, and reactive to light.  Neck:     Musculoskeletal: Normal range of motion and neck supple.  Cardiovascular:     Rate and Rhythm: Normal rate and regular rhythm.     Pulses: Normal pulses.     Heart sounds: Normal heart sounds.  Pulmonary:     Effort: Pulmonary effort is normal.     Breath sounds: Normal breath sounds. No wheezing or rales.  Abdominal:     General: Abdomen is flat. Bowel sounds are normal.     Tenderness: There is no abdominal tenderness. There is no guarding.  Musculoskeletal:     Right lower leg: Edema present.     Left lower leg: Edema present.  Skin:    General: Skin is warm and dry.     Capillary Refill: Capillary refill takes less than 2 seconds.  Neurological:     Mental Status: He is alert.     Deep Tendon Reflexes: Reflexes normal.     Comments: asterixis  Psychiatric:        Mood and Affect: Mood normal.      ED Treatments / Results  Labs (all labs ordered are listed, but only abnormal results are displayed) Results for orders placed or performed during the hospital encounter of 07/04/19  CBC  with Differential  Result Value Ref Range   WBC 4.3 4.0 - 10.5 K/uL   RBC 2.51 (L) 4.22 - 5.81 MIL/uL  Hemoglobin 8.8 (L) 13.0 - 17.0 g/dL   HCT 19.125.1 (L) 47.839.0 - 29.552.0 %   MCV 100.0 80.0 - 100.0 fL   MCH 35.1 (H) 26.0 - 34.0 pg   MCHC 35.1 30.0 - 36.0 g/dL   RDW 62.116.0 (H) 30.811.5 - 65.715.5 %   Platelets 103 (L) 150 - 400 K/uL   nRBC 0.0 0.0 - 0.2 %   Neutrophils Relative % 43 %   Neutro Abs 1.9 1.7 - 7.7 K/uL   Lymphocytes Relative 44 %   Lymphs Abs 1.9 0.7 - 4.0 K/uL   Monocytes Relative 10 %   Monocytes Absolute 0.4 0.1 - 1.0 K/uL   Eosinophils Relative 1 %   Eosinophils Absolute 0.1 0.0 - 0.5 K/uL   Basophils Relative 1 %   Basophils Absolute 0.0 0.0 - 0.1 K/uL   WBC Morphology MORPHOLOGY UNREMARKABLE    Immature Granulocytes 1 %   Abs Immature Granulocytes 0.02 0.00 - 0.07 K/uL  Comprehensive metabolic panel  Result Value Ref Range   Sodium 135 135 - 145 mmol/L   Potassium 3.3 (L) 3.5 - 5.1 mmol/L   Chloride 108 98 - 111 mmol/L   CO2 19 (L) 22 - 32 mmol/L   Glucose, Bld 69 (L) 70 - 99 mg/dL   BUN 7 6 - 20 mg/dL   Creatinine, Ser 8.461.61 (H) 0.61 - 1.24 mg/dL   Calcium 8.1 (L) 8.9 - 10.3 mg/dL   Total Protein 9.3 (H) 6.5 - 8.1 g/dL   Albumin 1.8 (L) 3.5 - 5.0 g/dL   AST 962155 (H) 15 - 41 U/L   ALT 61 (H) 0 - 44 U/L   Alkaline Phosphatase 177 (H) 38 - 126 U/L   Total Bilirubin 7.9 (H) 0.3 - 1.2 mg/dL   GFR calc non Af Amer 57 (L) >60 mL/min   GFR calc Af Amer >60 >60 mL/min   Anion gap 8 5 - 15  Protime-INR  Result Value Ref Range   Prothrombin Time 22.4 (H) 11.4 - 15.2 seconds   INR 2.0 (H) 0.8 - 1.2  Urinalysis, Routine w reflex microscopic  Result Value Ref Range   Color, Urine AMBER (A) YELLOW   APPearance CLEAR CLEAR   Specific Gravity, Urine 1.015 1.005 - 1.030   pH 6.0 5.0 - 8.0   Glucose, UA NEGATIVE NEGATIVE mg/dL   Hgb urine dipstick NEGATIVE NEGATIVE   Bilirubin Urine SMALL (A) NEGATIVE   Ketones, ur NEGATIVE NEGATIVE mg/dL   Protein, ur NEGATIVE NEGATIVE  mg/dL   Nitrite NEGATIVE NEGATIVE   Leukocytes,Ua NEGATIVE NEGATIVE  Ammonia  Result Value Ref Range   Ammonia 69 (H) 9 - 35 umol/L   Dg Chest Portable 1 View  Result Date: 07/04/2019 CLINICAL DATA:  Bilateral lower extremity pain and swelling. EXAM: PORTABLE CHEST 1 VIEW COMPARISON:  01/08/2019 FINDINGS: The heart size and mediastinal contours are within normal limits. Both lungs are clear. The visualized skeletal structures are unremarkable. IMPRESSION: Normal exam.  Ankle Electronically Signed   By: Francene BoyersJames  Maxwell M.D.   On: 07/04/2019 05:46    EKG None  Radiology Dg Chest Portable 1 View  Result Date: 07/04/2019 CLINICAL DATA:  Bilateral lower extremity pain and swelling. EXAM: PORTABLE CHEST 1 VIEW COMPARISON:  01/08/2019 FINDINGS: The heart size and mediastinal contours are within normal limits. Both lungs are clear. The visualized skeletal structures are unremarkable. IMPRESSION: Normal exam.  Ankle Electronically Signed   By: Francene BoyersJames  Maxwell M.D.   On: 07/04/2019 05:46  Procedures Procedures (including critical care time)  Medications Ordered in ED Medications - No data to display    Final Clinical Impressions(s) / ED Diagnoses   Final diagnoses:  Hepatic encephalopathy (HCC)  Peripheral edema  Cirrhosis of liver with ascites, unspecified hepatic cirrhosis type (HCC)  HIV infection, unspecified symptom status (HCC)    Admit to internal medicine residency.  Lactulose started   Makenzie Vittorio, MD 07/04/19 [id number]

## 2019-07-04 NOTE — ED Notes (Signed)
[name] leg tender and swollen upon touch.

## 2019-07-04 NOTE — ED Triage Notes (Signed)
Patient with bilateral leg swelling that started two weeks ago.  He states that they are very painful.  Patient states that they have been bleeding also.  Patient has neuromuscular disorder and HIV.

## 2019-07-04 NOTE — Progress Notes (Signed)
SLP Cancellation Note  Patient Details Name: WILLIARD KELLER MRN: [id number] DOB: 1991/08/05   Cancelled treatment:       Reason Eval/Treat Not Completed: Medical issues which prohibited therapy. BSE ordered but patient is NPO awaiting procedure. Will f/u next date.   Nadara Mode Tarrell 07/04/2019, 2:59 PM    Sonia Baller, MA, CCC-SLP Speech Therapy [facility] Acute Rehab Pager: [phone]

## 2019-07-04 NOTE — ED Notes (Signed)
ED TO INPATIENT HANDOFF REPORT  ED Nurse Name and Phone #: Rosely Fernandez  S Name/Age/Gender Justin Ball 28 y.o. male Room/Bed: 022C/022C  Code Status   Code Status: Prior  Home/SNF/Other Home Patient oriented to: self, place, time and situation Is this baseline? Yes   Triage Complete: Triage complete  Chief Complaint leg pain  Triage Note Patient with bilateral leg swelling that started two weeks ago.  He states that they are very painful.  Patient states that they have been bleeding also.  Patient has neuromuscular disorder and HIV.     Allergies Allergies  Allergen Reactions  . Acyclovir And Related Itching    Level of Care/Admitting Diagnosis ED Disposition    ED Disposition Condition Comment   Admit  The patient appears reasonably stabilized for admission considering the current resources, flow, and capabilities available in the ED at this time, and I doubt any other [facility] requiring further screening and/or treatment in the ED prior to admission is  present.       B Medical/Surgery History Past Medical History:  Diagnosis Date  . Aggressive behavior 06/02/2019  . 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 AKI (acute kidney injury) (HCC) 06/19/2019  . Alcoholic hepatitis 01/14/2019  . Anxiety   . 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 Bipolar 1 disorder (HCC)   . Chronic hepatitis C with hepatic coma (HCC)   . 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 Cognitive communication deficit    working w/ therapy  . 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 Depression   . 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 Encephalopathy, hepatic (HCC)  06/26/2019  . Fecal incontinence 11/19/2017  . Foley catheter in place   . 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 History of scabies 2013  . History of syphilis   . 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 Nausea and vomiting 01/14/2019  . 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 Neurogenic bowel    wears depends  . Neuromuscular disorder (HCC)   . 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 Seizure disorder [facility](HCC)    neurologist-  dr Marjory Liespenumalli ([facility])--caused by cryptococcal meningitis/ multiple infarctions--  no seizure since hospitalization May 2018  . Status epilepticus (HCC)   . URI (upper respiratory infection) 02/24/2019  . Visuospatial deficit    Past Surgical History:  Procedure Laterality Date  . CYSTOSCOPY N/A 10/10/2017   Procedure: CYSTOSCOPY;  Surgeon: Sebastian AcheManny, Theodore, MD;  [facility] ;  Service: Urology;  Laterality: N/A;  . EXAMINATION UNDER ANESTHESIA N/A 12/28/2014   Procedure: EXAM UNDER ANESTHESIA;  Surgeon: Karie SodaSteven Gross, MD;  Location: [facility];  Service: General;  Laterality: N/A;  . FLOOR OF MOUTH BIOPSY N/A 09/16/2016   Procedure: BIOPSY OF ORAL ABCESS;  Surgeon: Newman PiesSu Teoh, MD;  Location: [facility] OR;  Service: ENT;  Laterality: N/A;  . INCISION AND DRAINAGE ABSCESS N/A 09/16/2016   Procedure: INCISION AND DRAINAGE ABSCESS;  Surgeon: Janeece RiggersSu  Suszanne Connerseoh, MD;  Location: [facility] OR;  Service: ENT;  Laterality: N/A;  . INCISION AND DRAINAGE PERIRECTAL ABSCESS  09/ 03/ 2011   dr toth  . INCISION AND DRAINAGE PERIRECTAL ABSCESS N/A 12/28/2014   Procedure: IRRIGATION AND DEBRIDEMENT PERIRECTAL ABSCESS;  Surgeon: Karie SodaSteven Gross, MD;  Location: [facility];  Service: General;  Laterality: N/A;  Fistula repair and ablation  . INSERTION OF SUPRAPUBIC CATHETER N/A 10/10/2017    Procedure: INSERTION OF SUPRAPUBIC CATHETER;  Surgeon: Sebastian AcheManny, Theodore, MD;  [facility] [place];  Service: Urology;  Laterality: N/A;  . LAPAROSCOPIC REVISION VENTRICULAR-PERITONEAL (V-P) SHUNT N/A 06/12/2017   Procedure: LAPAROSCOPIC REVISION VENTRICULAR-PERITONEAL (V-P) SHUNT;  Surgeon: Kinsinger, De BlanchLuke Aaron, MD;  Location: [facility];  Service: General;  Laterality: N/A;  . SHUNT REMOVAL N/A 01/01/2018   Procedure: REMOVAL OF V-P SHUNT, PLACEMENT OF EVD;  Surgeon: Lisbeth RenshawNundkumar, Neelesh, MD;  Location: [facility];  Service: Neurosurgery;  Laterality: N/A;  . TRANSTHORACIC ECHOCARDIOGRAM  06/02/2017   EF 65-70%/  trivial PR  . VENTRICULOPERITONEAL SHUNT N/A 06/12/2017   Procedure: SHUNT INSERTION VENTRICULAR-PERITONEAL;  Surgeon: Lisbeth RenshawNundkumar, Neelesh, MD;  Location[facility];  Service: Neurosurgery;  Laterality: N/A;     A IV Location/Drains/Wounds Patient Lines/Drains/Airways Status   Active Line/Drains/Airways    Name:   Placement date:   Placement time:   Site:   Days:   Peripheral IV 06/07/19 Left;Anterior Forearm   06/07/19    1630    Forearm   27   Peripheral IV 07/04/19 Right Antecubital   07/04/19    0542    Antecubital   less than 1   Suprapubic Catheter Non-latex 18 Fr.   10/10/17    1121    Non-latex   632   Incision (Closed) 01/01/18 Head Right   01/01/18    1947     549   Incision - 3 Ports Abdomen 1: Umbilicus 2: Right;Medial;Lateral 3: Superior   06/12/17    1324     752          Intake/Output Last 24 hours No intake or output data in the 24 hours ending 07/04/19 0942  Labs/Imaging Results for orders placed or performed during the hospital encounter of 07/04/19 (from the past 48 hour(s))  CBC with Differential     Status: Abnormal   Collection Time: 07/04/19  4:12 AM  Result Value Ref Range   WBC 4.3 4.0 - 10.5 K/uL   RBC 2.51 (L) 4.22 - 5.81 MIL/uL   Hemoglobin 8.8 (L) 13.0 - 17.0 g/dL   HCT 96.025.1 (L) 45.439.0 - 09.852.0 %   MCV 100.0 80.0 - 100.0 fL   MCH 35.1 (H) 26.0 -  34.0 pg   MCHC 35.1 30.0 - 36.0 g/dL   RDW 11.916.0 (H) 14.711.5 - 82.915.5 %   Platelets 103 (L) 150 - 400 K/uL    Comment: REPEATED TO VERIFY Immature Platelet Fraction may be clinically indicated, consider ordering this additional test [id number] CONSISTENT WITH PREVIOUS RESULT    nRBC 0.0 0.0 - 0.2 %   Neutrophils Relative % 43 %   Neutro Abs 1.9 1.7 - 7.7 K/uL   Lymphocytes Relative 44 %   Lymphs Abs 1.9 0.7 - 4.0 K/uL   Monocytes Relative 10 %   Monocytes Absolute 0.4 0.1 - 1.0 K/uL   Eosinophils Relative 1 %   Eosinophils Absolute 0.1 0.0 - 0.5 K/uL   Basophils Relative 1 %   Basophils Absolute 0.0 0.0 - 0.1 K/uL   WBC  Morphology MORPHOLOGY UNREMARKABLE    Immature Granulocytes 1 %   Abs Immature Granulocytes 0.02 0.00 - 0.07 K/uL    Comment: Performed at [facility], [address] [place], [name] [address]  Comprehensive metabolic panel     Status: Abnormal   Collection Time: 07/04/19  4:12 AM  Result Value Ref Range   Sodium 135 135 - 145 mmol/L   Potassium 3.3 (L) 3.5 - 5.1 mmol/L   Chloride 108 98 - 111 mmol/L   CO2 19 (L) 22 - 32 mmol/L   Glucose, Bld 69 (L) 70 - 99 mg/dL   BUN 7 6 - 20 mg/dL   Creatinine, Ser 8.29 (H) 0.61 - 1.24 mg/dL   Calcium 8.1 (L) 8.9 - 10.3 mg/dL   Total Protein 9.3 (H) 6.5 - 8.1 g/dL   Albumin 1.8 (L) 3.5 - 5.0 g/dL   AST 562 (H) 15 - 41 U/L   ALT 61 (H) 0 - 44 U/L   Alkaline Phosphatase 177 (H) 38 - 126 U/L   Total Bilirubin 7.9 (H) 0.3 - 1.2 mg/dL   GFR calc non Af Amer 57 (L) >60 mL/min   GFR calc Af Amer >60 >60 mL/min   Anion gap 8 5 - 15    Comment: Performed at [facility], [address], [place], [name] [address]  Protime-INR     Status: Abnormal   Collection Time: 07/04/19  4:12 AM  Result Value Ref Range   Prothrombin Time 22.4 (H) 11.4 - 15.2 seconds   INR 2.0 (H) 0.8 - 1.2    Comment: (NOTE) INR goal varies based on device and disease states. Performed at [facility], [address] [place],  [name] [address]   Urinalysis, Routine w reflex microscopic     Status: Abnormal   Collection Time: 07/04/19  5:11 AM  Result Value Ref Range   Color, Urine AMBER (A) YELLOW    Comment: BIOCHEMICALS MAY BE AFFECTED BY COLOR   APPearance CLEAR CLEAR   Specific Gravity, Urine 1.015 1.005 - 1.030   pH 6.0 5.0 - 8.0   Glucose, UA NEGATIVE NEGATIVE mg/dL   Hgb urine dipstick NEGATIVE NEGATIVE   Bilirubin Urine SMALL (A) NEGATIVE   Ketones, ur NEGATIVE NEGATIVE mg/dL   Protein, ur NEGATIVE NEGATIVE mg/dL   Nitrite NEGATIVE NEGATIVE   Leukocytes,Ua NEGATIVE NEGATIVE    Comment: Performed at [facility], [address] [place], [name] [address]  Ammonia     Status: Abnormal   Collection Time: 07/04/19  5:11 AM  Result Value Ref Range   Ammonia 69 (H) 9 - 35 umol/L    Comment: Performed at [facility], [address] [place], [name] [address]  SARS Coronavirus 2 [facility] order, Performed in [facility] hospital lab)     Status: None   Collection Time: 07/04/19  5:45 AM   Specimen: Nasopharyngeal Swab  Result Value Ref Range   SARS Coronavirus 2 NEGATIVE NEGATIVE    Comment: (NOTE) If result is NEGATIVE SARS-CoV-2 target nucleic acids are NOT DETECTED. The SARS-CoV-2 RNA is generally detectable in upper and lower  respiratory specimens during the acute phase of infection. The lowest  concentration of SARS-CoV-2 viral copies this assay can detect is 250  copies / mL. A negative result does not preclude SARS-CoV-2 infection  and should not be used as the sole basis for treatment or other  patient management decisions.  A negative result may  occur with  improper specimen collection / handling, submission of specimen other  than nasopharyngeal swab, presence of viral mutation(s) within the  areas targeted by this assay, and inadequate number of viral copies  (<250 copies / mL). A negative result must be combined with clinical  observations, patient history, and epidemiological  information. If result is POSITIVE SARS-CoV-2 target nucleic acids are DETECTED. The SARS-CoV-2 RNA is generally detectable in upper and lower  respiratory specimens dur ing the acute phase of infection.  Positive  results are indicative of active infection with SARS-CoV-2.  Clinical  correlation with patient history and other diagnostic information is  necessary to determine patient infection status.  Positive results do  not rule out bacterial infection or co-infection with other viruses. If result is PRESUMPTIVE POSTIVE SARS-CoV-2 nucleic acids MAY BE PRESENT.   A presumptive positive result was obtained on the submitted specimen  and confirmed on repeat testing.  While 2019 novel coronavirus  (SARS-CoV-2) nucleic acids may be present in the submitted sample  additional confirmatory testing may be necessary for epidemiological  and / or clinical management purposes  to differentiate between  SARS-CoV-2 and other Sarbecovirus currently known to infect humans.  If clinically indicated additional testing with an alternate test  methodology [phone] is advised. The SARS-CoV-2 RNA is generally  detectable in upper and lower respiratory sp ecimens during the acute  phase of infection. The expected result is Negative. Fact Sheet for Patients:  BoilerBrush.com.cyhttps://www.fda.gov/media/136312/download Fact Sheet for Healthcare Providers: https://pope.com/https://www.fda.gov/media/136313/download This test is not yet approved or cleared by the [place] FDA and has been authorized for detection and/or diagnosis of SARS-CoV-2 by FDA under an Emergency Use Authorization (EUA).  This EUA will remain in effect (meaning this test can be used) for the duration of the COVID-19 declaration under Section 564(b)(1) of the Act, 21 U.S.C. section 360bbb-3(b)(1), unless the authorization is terminated or revoked sooner. Performed at [facility], [address] [place], [place] [address]   Rapid urine drug screen  (hospital performed)     Status: None   Collection Time: 07/04/19  5:59 AM  Result Value Ref Range   Opiates NONE DETECTED NONE DETECTED   Cocaine NONE DETECTED NONE DETECTED   Benzodiazepines NONE DETECTED NONE DETECTED   Amphetamines NONE DETECTED NONE DETECTED   Tetrahydrocannabinol NONE DETECTED NONE DETECTED   Barbiturates NONE DETECTED NONE DETECTED    Comment: (NOTE) DRUG SCREEN FOR MEDICAL PURPOSES ONLY.  IF CONFIRMATION IS NEEDED FOR ANY PURPOSE, NOTIFY LAB WITHIN 5 DAYS. LOWEST DETECTABLE LIMITS FOR URINE DRUG SCREEN Drug Class                     Cutoff (ng/mL) Amphetamine and metabolites    1000 Barbiturate and metabolites    200 Benzodiazepine                 200 Tricyclics and metabolites     300 Opiates and metabolites        300 Cocaine and metabolites        300 THC                            50 Performed at [facility], [address] [place], [place] [address]    Dg Chest Portable 1 View  Result Date: 07/04/2019 CLINICAL DATA:  Bilateral lower extremity pain and swelling. EXAM: PORTABLE CHEST 1 VIEW COMPARISON:  01/08/2019 FINDINGS: The  heart size and mediastinal contours are within normal limits. Both lungs are clear. The visualized skeletal structures are unremarkable. IMPRESSION: Normal exam.  Ankle Electronically Signed   By: Lorriane Shire M.D.   On: 07/04/2019 05:46    Pending Labs Unresulted Labs (From admission, onward)   None      Vitals/Pain Today's Vitals   07/04/19 0734 07/04/19 0745 07/04/19 0800 07/04/19 0830  BP: 116/70 109/60 123/67 114/64  Pulse:    93  Resp:      Temp:      TempSrc:      SpO2:    94%  PainSc:        Isolation Precautions Airborne and Contact precautions  Medications Medications  lactulose (CHRONULAC) 10 GM/15ML solution 30 g (30 g Oral Given 07/04/19 0732)    Mobility walks Low fall risk   Focused Assessments Cardiac Assessment Handoff:    Lab Results  Component Value Date   CKTOTAL 101  01/06/2013   TROPONINI <0.03 05/20/2017   No results found for: DDIMER Does the Patient currently have chest pain? No     R Recommendations: See Admitting Provider Note  Report given to:   Additional Notes: .

## 2019-07-04 NOTE — Progress Notes (Signed)
PT Cancellation Note  Patient Details Name: Justin Ball MRN: [id number] DOB: 1991-04-12   Cancelled Treatment:    Reason Eval/Treat Not Completed: Patient at procedure or test/unavailable.  Awaiting results of [place] for DVT.  Try again at another time.   Ramond Dial 07/04/2019, 3:31 PM  Mee Hives, PT MS Acute Rehab Dept. Number: [place]

## 2019-07-04 NOTE — Progress Notes (Addendum)
1127: Patient arrived to room 5W34. Unable to complete admission navigator as patient on phone and does not want to answer any further questions.   1326: Call back to grandmother Collie Siad. No answer.  1340: Patient on bedpan. Told to call when ready. Alarm going off, patient walked himself to bathroom. Unable to get samples at this time. Alarm reset.   1707: Patient states he is able to take care of self at home with grandmother.Rfefusing to use urinal and BSC.

## 2019-07-04 NOTE — Progress Notes (Addendum)
Home visit made with Fredick today. [name] grandmother stated they have just sprayed for roaches and that is the reason for the large amount that are present for the visit. Limited resources taken into the home to limit the risk of being a carrier into someone else's home. [name] grandmother was apologetic and understanding. At the start of the visit Herbert's grandmother and I went through each medication including the reason for the medication and how often Alek should receive it. Pill box also filled at this time along with consolidation of pill boxes that Ms Collie Siad already had in the home. Ms. Sue(Napolean's grandmother) had all medications on hand with the exception of his rifaximin. Ms Collie Siad stated the medication is too expensive (2500/month). Call placed to [facility] and requested assistance with coverage. Spoke with Adonis Brook who stated prior authorization just needs to be completed and she has followed up with Dr Celesta Aver office. Educated Jamoni and his grandmother on the importance of all his medications with extra time placed on how Lactulose works to help the liver with removing toxins in the body. Pill box filled with all other medications at this time.   Kshawn states he has been taking the medication and goes on to describe the taste of the lactulose. On assessment Robby is answering questions appropriately and does not appear to be in any distress. No evident signs of jaundice noted but lower extremity edema is noted. Ms Collie Siad states she has already given Morley his PRN lasix today and wondered if she should give him another. Instructed Ms Collie Siad to not give another lasix today (since the order is for once daily) but she does need to give an extra potassium each time she gives the lasix. Instructed Promise to elevate his legs while resting on the couch to reduce swelling. Edema is limited to his feet and ankles bilaterally at this time. Instructed Beren/Ms Collie Siad if the edema continues to rise  up his legs and accompanied by SOB he needs to seek medical attention.   Ms. Collie Siad would like to get Jakin into an adult day program and is being assisted by [facility]. After visit, spoke with Lovena Le to discuss the wishes of Ms. Collie Siad.  Ms Collie Siad stated the [facility] service that was set up previously did not work out because of the [employer] and [place]'s lack of cooperation. At this time consent for further visits signed and next visit is planned for next week.

## 2019-07-04 NOTE — H&P (Addendum)
Date: 07/04/2019               Patient Name:  Justin Ball MRN: [id number]  DOB: 05/07/1991 Age / Sex: 28 y.o., male   PCP: Dolan AmenWinters, Steven, MD         Medical Service: Internal Medicine Teaching Service         Attending Physician: Dr. Sandre Kittyaines, Elwin MochaAlexander N, MD    First Contact: Dr. Lyn HollingsheadAlexander Pager: [phone]  Second Contact: Dr. 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  The patient states that his legs have become progressively more swollen and severely tender even with slight touch. He denies any nausea, vomiting, abdominal pain, dizziness, diarrhea. States that he has been having one bowel movement per day over the past few days.   Called patient's grandmother (Ms. Baldwin JamaicaSue Bartley) who told patient to mentioned that the patient's legs and feet were throbbing for the past 2 days. He has been keeping feet elevated and taking lasix 20mg  daily. She notes that the patient stumbled Monday night when he was trying to go to bathroom.  Of note, the patient has a history of nonadherence to his medication. The patient had a recent televisit with GI Dr. Leone PayorGessner 06/26/19 during which time the patient was started on xifaxan.  In the ED, patient was afebrile, cardiac in 100s, normotensive 100-120/50-70s, saturating at 100% on room air. He was given a dose of lactulose.   Meds:  No outpatient medications have been marked as taking for the 07/04/19 encounter [facility](Hospital Encounter).    Allergies: Allergies as of 07/04/2019 - Review Complete 07/04/2019  Allergen Reaction Noted  . Acyclovir and related Itching 12/31/2017   Past  Medical History:  Diagnosis Date  . Aggressive behavior 06/02/2019  . 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 AKI (acute kidney injury) (HCC) 06/19/2019  . Alcoholic hepatitis 01/14/2019  . Anxiety   . 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 Bipolar 1 disorder (HCC)   . Chronic hepatitis C with hepatic coma (HCC)   . 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 Cognitive communication deficit    working w/ therapy  . 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 Depression   . 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 Encephalopathy, hepatic (HCC) 06/26/2019  . Fecal incontinence 11/19/2017  . Foley catheter in place   . 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 History of scabies 2013  . History of syphilis   . 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 Nausea and vomiting 01/14/2019  . 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  Neurogenic bowel    wears depends  . Neuromuscular disorder ([place])   . 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 Seizure disorder [facility])    neurologist-  dr Leta Baptist ([place] [facility])--caused by cryptococcal meningitis/ multiple infarctions--  no seizure since hospitalization May 2018  . Status epilepticus ([place])   . URI (upper respiratory infection) 02/24/2019  . Visuospatial deficit     Family History:  Family History  Problem Relation Age of Onset  . Hypertension Other    Social History:  Lives with his grandmother (Ms. Orbie Pyo) who is his  main caretaker Drinks alcohol periodically, was not able to quantify how much or how often  Smokes 3 cigarettes daily No drug use   Review of Systems: A complete ROS was negative except as per HPI.   Physical Exam: Blood pressure (!) 91/46, pulse 90, temperature 97.9 F (36.6 C), temperature source Oral, resp. rate 18, SpO2 98 %.  Physical Exam  Constitutional: He is oriented to person, place, and time. He appears well-developed and well-nourished. He appears lethargic. He is easily aroused.  HENT:  Head: Normocephalic and atraumatic.  Eyes: Conjunctivae are normal.  Cardiovascular: Normal rate, regular rhythm and normal heart sounds.  Respiratory: Effort normal and breath sounds normal. No respiratory distress. He has no wheezes.  GI: Soft. Bowel sounds are normal. He exhibits distension. He exhibits no mass. There is no abdominal tenderness. There is no guarding.  Mild asterexis  Musculoskeletal:        General: No edema.  Neurological: He is oriented to person, place, and time and easily aroused. He appears lethargic.  Unable to do basic arithmetic  Skin: He is not diaphoretic.  Skin with 1cm hyperpigmented lesions throughout body. Does not appear jaundiced. No palmar erythema  Psychiatric: Thought content normal. His affect is inappropriate. He is slowed.   Labs CBC- hb 8.8, hct 25, lites 103, MCV 100 CMP-Na 138, K 3.3, glucose 69, cr 1.61, AST 155, ALT 61, ALP 177 PT 22, INR 2.0 UA negative nitrite, negative leukocytes, no protein, negative ketones Ammonia 69 UDS negative  COVID-19 signed out pending  EKG: none  CXR: personally reviewed my interpretation is without consolidation or effusions.  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 He has been more lethargic and has had a fall recently. Treating acute encephalopathy as possible secondary to decompensated cirrhosis.   Acute encephalopathy  Patient is not on any centrally acting medications. Does not have any fevers or signs/symptoms suggestive of infection. No significant electrolyte abnormalities.   Encephalopathy is thought to be due to hepatic decompensation. If interventions described below do not work, then will need to consider other causes.   Bilateral lower extremity swelling Decompensated Cirrhosis Grade 2 Hepatic encephalopathy Patient has bilateral lower extremity swelling, ascites, and encephalopathy which is likely due to decompensated cirrhosis. He has mild transaminitis ast 155, alt 61, alp 177. Although ammonia level not accurately represent encephalopathy it was elevated at 69.    Patient is supposed to be taking lactulose 30 mL 3 times daily, rifaximin 550 mg twice daily at home, Lasix 20 mg as needed and spironolactone 25mg  bid at home.   CT abd June 03 2019 showed cholelithiais, mild gallbladder wall thickening, mesenteric edema, no splenomegaly.  No significant changes in hb to suggest an acute bleed. However, the patient has a new baseline hb over the past few months ranging 8-9, but his previous hb was 11-13.   UA and CXR not suggestive of infection. Is afebrile. No significant electrolyte disturbances. Not on any sedating meds.  Several possible precipitating factors include- he has a history of poor adherence to maintenance medication (lactulose, rifaximin). He continues to drink alcohol (last drink was smirnoff on 06/28/19). Patient may be intravascularly depleted.   -RUQ ultrasound to look for possible thrombus  -Patient should avoid alcohol completely- case management consult to discuss this  -Will need EGD at some point to look for varices  -Continue lactulose titrated to get 2-3 bowel movements daily -Continue  rifaximin 550 mg twice daily -continue spironolactone 25mg  bid  -will start lasix 20mg  qd -FOBT ordered -Patient should be set up with home health -patient should not drive   Hypotension  AKI Patient's blood pressure dropped to 90s systolic while in the ED. He also has an increase in his creatinine to 1.61 from 1.2.   -urine studies ordered  -requested RN to recheck blood pressure and if continues to have low MAP give 1L LR bolus   HIV [facility] He is supposed to be taking Tivicay and symtuza, but he unfortunately not been adherent. OI prophylaxis for PCP with bactrim, valacyclovir, and fluconazole due to history of cryptococcal meningitis.   Patient's last viral load was 5480 on 06/25/2019, CD4 count 110.  -HIV integrase and HIV genotype pending -Continue Bactrim 400-80 mg daily -Continue valacyclovir 1000mg  Monday Wednesday and Friday -Although patient is asymptomatic at this time and send out covid19 test was ordered, we switched it to a rapid test as the patient has HIV/AIDS and may not be able to mount a sufficient response. -Has [facility] home health nurse Lind CovertAmbre McNeil who has been working with patient on making sure he gets his medication.  Liver cirrhosis 2/2 hepatitis B & C as well as alcohol Chronic hepatitis B without delta agent  Chronic hepatitis C Hep B surface ag, core antibody, E ag positive.  Hep B DNA level 1150 in November 2018 and 1.3 log 10 in January 2020.   Has hep C genotype 1a, viral load [id number], HCV antibody >11 in January 2020.  -Patient should continue following with infectious disease for treatment of his chronic hepatitis B and C and may need to follow with gastroenterology/hepatology (Dr. Leone PayorGessner)  Seizure disorder  Follows with [facility]-Dr. Marjory LiesPenumalli. Was initially thought to be caused by cryptococcal meningitis. Last seizure was one year ago.  -Continue Keppra 1000mg  twice daily  Bipolar 1 disorder  Not  competent to care for himself according to his ID physician.  She is F4 level on FibroScan given severe severe fibrosis.  -continue quetiapine 25mg  bid  Bed Bugs  -contact precautions  Hypokalemia  Patient's potassium on arrival was 3.3  Dispo: Admit patient to Inpatient with expected length of stay greater than 2 midnights.  SignedLorenso Courier: Alexiana Laverdure, MD 07/04/2019, 10:14 AM  Pager: [phone]

## 2019-07-05 LAB — CBC
HCT: 23.7 % — ABNORMAL LOW (ref 39.0–52.0)
Hemoglobin: 8.4 g/dL — ABNORMAL LOW (ref 13.0–17.0)
MCH: 35.4 pg — ABNORMAL HIGH (ref 26.0–34.0)
MCHC: 35.4 g/dL (ref 30.0–36.0)
MCV: 100 fL (ref 80.0–100.0)
Platelets: 95 10*3/uL — ABNORMAL LOW (ref 150–400)
RBC: 2.37 MIL/uL — ABNORMAL LOW (ref 4.22–5.81)
RDW: 15.9 % — ABNORMAL HIGH (ref 11.5–15.5)
WBC: 3.6 10*3/uL — ABNORMAL LOW (ref 4.0–10.5)
nRBC: 0 % (ref 0.0–0.2)

## 2019-07-05 LAB — PROTIME-INR
INR: 2.2 — ABNORMAL HIGH (ref 0.8–1.2)
Prothrombin Time: 24.3 seconds — ABNORMAL HIGH (ref 11.4–15.2)

## 2019-07-05 LAB — COMPREHENSIVE METABOLIC PANEL
ALT: 55 U/L — ABNORMAL HIGH (ref 0–44)
AST: 143 U/L — ABNORMAL HIGH (ref 15–41)
Albumin: 1.6 g/dL — ABNORMAL LOW (ref 3.5–5.0)
Alkaline Phosphatase: 143 U/L — ABNORMAL HIGH (ref 38–126)
Anion gap: 8 (ref 5–15)
BUN: 5 mg/dL — ABNORMAL LOW (ref 6–20)
CO2: 20 mmol/L — ABNORMAL LOW (ref 22–32)
Calcium: 8.6 mg/dL — ABNORMAL LOW (ref 8.9–10.3)
Chloride: 107 mmol/L (ref 98–111)
Creatinine, Ser: 1.56 mg/dL — ABNORMAL HIGH (ref 0.61–1.24)
GFR calc Af Amer: >60 mL/min (ref >60)
GFR calc non Af Amer: 60 mL/min — ABNORMAL LOW (ref >60)
Glucose, Bld: 76 mg/dL (ref 70–99)
Potassium: 3.2 mmol/L — ABNORMAL LOW (ref 3.5–5.1)
Sodium: 135 mmol/L (ref 135–145)
Total Bilirubin: 6.8 mg/dL — ABNORMAL HIGH (ref 0.3–1.2)
Total Protein: 8.5 g/dL — ABNORMAL HIGH (ref 6.5–8.1)

## 2019-07-05 LAB — MAGNESIUM: Magnesium: 1.6 mg/dL — ABNORMAL LOW (ref 1.7–2.4)

## 2019-07-05 MED ORDER — OXYCODONE HCL 5 MG PO TABS
5.0000 mg | ORAL_TABLET | Freq: Two times a day (BID) | ORAL | Status: DC | PRN
  Administered 2019-07-05 – 2019-07-07 (×4): 5 mg via ORAL
  Filled 2019-07-05 (×4): qty 1

## 2019-07-05 MED ORDER — ACETAMINOPHEN 650 MG RE SUPP: 650.0000 mg | Freq: Two times a day (BID) | RECTAL | Status: DC | PRN

## 2019-07-05 MED ORDER — ACETAMINOPHEN 325 MG PO TABS
650.0000 mg | ORAL_TABLET | Freq: Two times a day (BID) | ORAL | Status: DC | PRN
  Administered 2019-07-06: 650 mg via ORAL
  Filled 2019-07-05 (×2): qty 2

## 2019-07-05 MED ORDER — MAGNESIUM OXIDE 400 (241.3 MG) MG PO TABS
200.0000 mg | ORAL_TABLET | Freq: Every day | ORAL | Status: DC
  Administered 2019-07-05 – 2019-07-07 (×3): 200 mg via ORAL
  Filled 2019-07-05 (×3): qty 1

## 2019-07-05 MED ORDER — LACTULOSE 10 GM/15ML PO SOLN
30.0000 g | Freq: Three times a day (TID) | ORAL | Status: DC
  Administered 2019-07-05 – 2019-07-07 (×6): 30 g via ORAL
  Filled 2019-07-05 (×6): qty 45

## 2019-07-05 MED ORDER — SPIRONOLACTONE 25 MG PO TABS
25.0000 mg | ORAL_TABLET | Freq: Two times a day (BID) | ORAL | Status: DC
  Administered 2019-07-05 – 2019-07-07 (×5): 25 mg via ORAL
  Filled 2019-07-05 (×5): qty 1

## 2019-07-05 NOTE — Progress Notes (Signed)
OT Cancellation Note  Patient Details Name: Justin Ball MRN: [id number] DOB: 03/08/1991   Cancelled Treatment:    Reason Eval/Treat Not Completed: Fatigue/lethargy limiting ability to participate Pt sleeping upon arrival. Pt requested OT return later in day. OT will continue to follow pt and re-attempt evaluation as time allows.   Dorinda Hill OTR/L Acute Rehabilitation Services Office: [place] 07/05/2019, 12:11 PM

## 2019-07-05 NOTE — Progress Notes (Signed)
Occupational Therapy Evaluation Patient Details Name: Justin Ball MRN: [id number] DOB: 01/18/1991 Today's Date: 07/05/2019    History of Present Illness 28 yo man with complex medical history admitted for leg swelling for 2 weeks. Also has worsening confusion. He has uncontrolled HIV with AIDS, history of cryptococcal meningitis, seizures, chronic Hep B and C, and alcoholic liver damage. Admitted 7/10 with hepatic encephalopathy with decompensated cirrhosis.   Clinical Impression   PTA, pt was living at home with his grandmother, and was independent with ADL/IADL and functional mobility. Pt currently requires S for functional mobility with ADL. Pt requires minguard for tub transfers. Pt demonstrates cognitive limitations impacting safety and independence with ADL/IADL and functional mobiltiy without use of AD. Pt will continue to benefit from skilled OT services to maximize safety and independence with ADL/IADL and functional mobility. Will continue to follow acutely and progress as tolerated.      Follow Up Recommendations  No OT follow up    Equipment Recommendations  None recommended by OT    Recommendations for Other Services       Precautions / Restrictions Precautions Precautions: None Restrictions Weight Bearing Restrictions: No      Mobility Bed Mobility Overal bed mobility: Independent                Transfers Overall transfer level: Needs assistance Equipment used: None Transfers: Sit to/from Stand Sit to Stand: Supervision         General transfer comment: supervision for safety     Balance Overall balance assessment: Needs assistance Sitting-balance support: Feet supported;No upper extremity supported Sitting balance-Leahy Scale: Good     Standing balance support: During functional activity;No upper extremity supported Standing balance-Leahy Scale: Good                             ADL either performed or assessed with clinical  judgement   ADL Overall ADL's : Modified independent Eating/Feeding: Independent   Grooming: Modified independent   Upper Body Bathing: Modified independent   Lower Body Bathing: Supervison/ safety   Upper Body Dressing : Modified independent   Lower Body Dressing: Supervision/safety   Toilet Transfer: Supervision/safety   Toileting- Clothing Manipulation and Hygiene: Supervision/safety       Functional mobility during ADLs: Supervision/safety General ADL Comments: S for safety;pt demonstrates impulsivity, minor instability noted no LOB     Vision Baseline Vision/History: No visual deficits       Perception     Praxis      Pertinent Vitals/Pain Pain Assessment: 0-10 Pain Score: 10-Worst pain ever Pain Location: feet especially with pressure Pain Descriptors / Indicators: Pressure;Throbbing;Aching Pain Intervention(s): Limited activity within patient's tolerance;Monitored during session;Premedicated before session     Hand Dominance Right   Extremity/Trunk Assessment Upper Extremity Assessment Upper Extremity Assessment: Overall WFL for tasks assessed   Lower Extremity Assessment Lower Extremity Assessment: Defer to PT evaluation   Cervical / Trunk Assessment Cervical / Trunk Assessment: Normal   Communication Communication Communication: No difficulties   Cognition Arousal/Alertness: Awake/alert Behavior During Therapy: WFL for tasks assessed/performed Overall Cognitive Status: Impaired/Different from baseline Area of Impairment: Problem solving;Awareness                           Awareness: Emergent Problem Solving: Slow processing;Decreased initiation;Difficulty sequencing;Requires verbal cues;Requires tactile cues General Comments: Pt requires increased time for processing;   General Comments  VSS  Exercises     Shoulder Instructions      Home Living Family/patient expects to be discharged to:: Private residence Living  Arrangements: Other relatives(grandmother) Available Help at Discharge: Family;Available 24 hours/day Type of Home: House Home Access: Level entry     Home Layout: One level     Bathroom Shower/Tub: Teacher, early years/pre: Standard     Home Equipment: None          Prior Functioning/Environment Level of Independence: Independent        Comments: does some cooking with grandmother supervision        OT Problem List: Decreased activity tolerance;Impaired balance (sitting and/or standing);Decreased cognition;Decreased safety awareness;Pain      OT Treatment/Interventions: Self-care/ADL training;Therapeutic exercise;Cognitive remediation/compensation;Patient/family education;Balance training    OT Goals(Current goals can be found in the care plan section) Acute Rehab OT Goals Patient Stated Goal: to go home OT Goal Formulation: With patient Time For Goal Achievement: 07/19/19 Potential to Achieve Goals: Good ADL Goals Pt Will Perform Grooming: Independently Pt Will Perform Upper Body Dressing: Independently Pt Will Perform Lower Body Dressing: Independently Pt Will Transfer to Toilet: with modified independence;ambulating Pt Will Perform Tub/Shower Transfer: with modified independence  OT Frequency: Min 2X/week   Barriers to D/C:            Co-evaluation              AM-PAC OT "6 Clicks" Daily Activity     Outcome Measure Help from another person eating meals?: None Help from another person taking care of personal grooming?: None Help from another person toileting, which includes using toliet, bedpan, or urinal?: A Little Help from another person bathing (including washing, rinsing, drying)?: A Little Help from another person to put on and taking off regular upper body clothing?: None Help from another person to put on and taking off regular lower body clothing?: A Little 6 Click Score: 21   End of Session Nurse Communication: Mobility  status  Activity Tolerance: Patient tolerated treatment well;Patient limited by pain Patient left: in bed;with call bell/phone within reach  OT Visit Diagnosis: Other abnormalities of gait and mobility (R26.89);Muscle weakness (generalized) (M62.81);Pain Pain - Right/Left: (bilat) Pain - part of body: Ankle and joints of foot                Time: 7564-3329 OT Time Calculation (min): 10 min Charges:  OT General Charges $OT Visit: 1 Visit OT Evaluation $OT Eval Low Complexity: [place] OTR/L Acute Rehabilitation Services Office: [place] 07/05/2019, 3:31 PM

## 2019-07-05 NOTE — Evaluation (Signed)
Clinical/Bedside Swallow Evaluation Patient Details  Name: Justin Ball MRN: [id number] Date of Birth: 03/23/1991  Today's Date: 07/05/2019 Time: SLP Start Time (ACUTE ONLY): 0919 SLP Stop Time (ACUTE ONLY): 0935 SLP Time Calculation (min) (ACUTE ONLY): 16 min  Past Medical History:  Past Medical History:  Diagnosis Date  . Aggressive behavior 06/02/2019  . 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 AKI (acute kidney injury) (HCC) 06/19/2019  . Alcoholic hepatitis 01/14/2019  . Anxiety   . 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 Bipolar 1 disorder (HCC)   . Chronic hepatitis C with hepatic coma (HCC)   . 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 Cognitive communication deficit    working w/ therapy  . 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 Depression   . 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 Encephalopathy, hepatic (HCC) 06/26/2019  . Fecal incontinence 11/19/2017  . Foley catheter in place   . 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 History of scabies 2013  . History of syphilis   . 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 Nausea and vomiting 01/14/2019  . 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 Neurogenic bowel    wears depends  . Neuromuscular disorder (HCC)   . 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 Seizure disorder [facility](HCC)    neurologist-  dr Marjory Liespenumalli ([facility])--caused by cryptococcal meningitis/ multiple infarctions--  no seizure since  hospitalization May 2018  . Status epilepticus (HCC)   . URI (upper respiratory infection) 02/24/2019  . Visuospatial deficit    Past Surgical History:  Past Surgical History:  Procedure Laterality Date  . CYSTOSCOPY N/A 10/10/2017   Procedure: CYSTOSCOPY;  Surgeon: Sebastian AcheManny, Theodore, MD;  [facility];  Service: Urology;  Laterality: N/A;  . EXAMINATION UNDER ANESTHESIA N/A 12/28/2014   Procedure: EXAM UNDER ANESTHESIA;  Surgeon: Karie SodaSteven Gross, MD;  Location: [facility];  Service: General;  Laterality: N/A;  . FLOOR OF MOUTH BIOPSY N/A 09/16/2016   Procedure: BIOPSY OF ORAL ABCESS;  Surgeon: Newman PiesSu Teoh, MD;  Location: [facility];  Service: ENT;  Laterality: N/A;  . INCISION AND DRAINAGE ABSCESS N/A 09/16/2016   Procedure: INCISION AND DRAINAGE ABSCESS;  Surgeon: Newman PiesSu Teoh, MD;  Location: [facility];  Service: ENT;  Laterality: N/A;  . INCISION AND DRAINAGE PERIRECTAL ABSCESS  09/ 03/ 2011   dr toth  . INCISION AND DRAINAGE PERIRECTAL ABSCESS N/A 12/28/2014   Procedure: IRRIGATION AND DEBRIDEMENT PERIRECTAL ABSCESS;  Surgeon: Karie SodaSteven Gross, MD;  Location: [facility];  Service: General;  Laterality: N/A;  Fistula repair and ablation  . INSERTION OF SUPRAPUBIC CATHETER N/A 10/10/2017   Procedure: INSERTION OF SUPRAPUBIC CATHETER;  Surgeon: Sebastian AcheManny, Theodore, MD;  [facility] ;  Service: Urology;  Laterality: N/A;  . LAPAROSCOPIC REVISION VENTRICULAR-PERITONEAL (V-P) SHUNT N/A 06/12/2017   Procedure: LAPAROSCOPIC REVISION VENTRICULAR-PERITONEAL (V-P) SHUNT;  Surgeon: Kinsinger, De BlanchLuke Aaron, MD;  Location: [facility];  Service: General;  Laterality: N/A;  .  SHUNT REMOVAL N/A 01/01/2018   Procedure: REMOVAL OF V-P SHUNT, PLACEMENT OF EVD;  Surgeon: Consuella Lose, MD;  Location: [place];  Service: Neurosurgery;  Laterality: N/A;  . TRANSTHORACIC ECHOCARDIOGRAM  06/02/2017   EF 65-70%/  trivial PR  . 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  Assessment / Plan / Recommendation Clinical Impression   Pt presented with a normal oropharyngeal swallow during BSE; pt without overt s/s of aspiration noted; timely swallow and no indication of pharyngeal/esophageal dysphagia during examination; Oral mech WFL and pt with no risk for aspiration with current diet of Regular/thin (2 gm sodium); ST will s/o at this time; resume current diet; thank you for this consult. 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, chronic viral hep B, alcoholic hepatitis, bipolar 1 disorder, seizure disorder who presented with bilateral leg swelling that started 2 weeks ago Type of Study: Bedside Swallow Evaluation Previous Swallow Assessment: (n/a) Diet Prior to this Study: Regular;Thin liquids;Other (Comment)(2 gm sodium) Temperature Spikes Noted: No Respiratory Status: Room air History of Recent Intubation: No Behavior/Cognition: Alert;Cooperative;Pleasant mood Oral  Cavity Assessment: Within Functional Limits Oral Care Completed by SLP: Recent completion by staff Oral Cavity - Dentition: Adequate natural dentition Vision: Functional for self-feeding Self-Feeding Abilities: Able to feed self Patient Positioning: Upright in bed Baseline Vocal Quality: Normal Volitional Cough: Other (Comment)(not elicited d/t Covid restrictions) Volitional Swallow: Able to elicit    Oral/Motor/Sensory Function Overall Oral Motor/Sensory Function: Within functional limits   Ice Chips Ice chips: Not tested   Thin Liquid Thin Liquid: Within functional limits Presentation: Cup;Straw    Nectar Thick Nectar Thick Liquid: Not tested   Honey Thick Honey Thick Liquid: Not tested   Puree Puree: Within functional limits Presentation: Self Fed   Solid     Solid: Within functional limits Presentation: Self Fed      Elvina Sidle, M.S., CCC-SLP 07/05/2019,9:40 AM

## 2019-07-05 NOTE — Progress Notes (Signed)
3: Page to MD. Patient asking for pain meds. Awaiting C/B.  1020" Second page to MD about pain meds. Will place order. Awaiting C/B.  1430: Patient complaining of pain. Asking for pain meds. Page to MD.

## 2019-07-05 NOTE — Progress Notes (Signed)
Subjective: Patient seen on rounds this morning.  Resting comfortably in bed.  Conversive and answers questions appropriately.  Patient does have some pain in bilateral dorsal aspect of the feet but denies pain anywhere else.  Last bowel movement was yesterday.  Patient has a cramp in the middle of our conversation in his left thigh that went away within a few seconds.  Objective:  Vital signs in last 24 hours: Vitals:   07/04/19 1134 07/04/19 1134 07/04/19 2159 07/05/19 0636  BP: 116/65 116/65 108/66 (!) 106/56  Pulse: 85 86 87 (!) 105  Resp:   16 16  Temp:   98 F (36.7 C) 98.3 F (36.8 C)  TempSrc:   Oral Oral  SpO2: 100% 100% 100% 98%  Weight:  83.2 kg    Height:  '6\' 1"'  (1.854 m)     Physical Exam Constitutional:      General: He is not in acute distress.    Appearance: He is normal weight. He is not ill-appearing, toxic-appearing or diaphoretic.  HENT:     Head: Normocephalic and atraumatic.     Nose: No rhinorrhea.  Eyes:     Extraocular Movements: Extraocular movements intact.  Cardiovascular:     Rate and Rhythm: Normal rate and regular rhythm.     Pulses: Normal pulses.     Heart sounds: Normal heart sounds. No murmur. No friction rub. No gallop.   Pulmonary:     Effort: No respiratory distress.     Breath sounds: Normal breath sounds. No wheezing or rales.  Abdominal:     General: Abdomen is flat. There is no distension.     Palpations: Abdomen is soft.     Tenderness: There is no abdominal tenderness. There is no guarding.  Musculoskeletal:        General: No swelling.  Skin:    General: Skin is warm.     Findings: Lesion (bug bite appearing lesions & excorations on BL shins) present. No rash.  Neurological:     Mental Status: He is alert and oriented to person, place, and time.     Sensory: No sensory deficit.     Comments: Patient was able to correctly recite name place and month.  Said year was 2021.  Mild asterixis on L. barely appreciable.      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 He recently established care with GI, Dr. Carlean Purl on 7/1 for which he was prescribed  rifaximin and lactulose, but wasn't able to take it due to prior authorization that was not obtained. Per his grandmother who he lives with at home, he has been more lethargic, confused  and recently had a fall.  Right upper quadrant ultrasound was significant for slight pericholecystic fluid and mildly edematous gallbladder.  Exam one 7/11 was reassuring that he does  not have cholecystitis.   #Bilateral lower extremity swelling #Decompensated Cirrhosis #Hepatic encephalopathy Patient has bilateral lower extremity swelling, ascites, and encephalopathy which is likely due to decompensated cirrhosis. He has mild transaminitis AST 155, ALT 61, Alk phos 177 on admission. Improved to 143, 55, 143 respectively.  It should be noted that ammonia level not accurately represent encephalopathy, but it was elevated at 69 on admission. When the patient was seen today on exam he was mentating very well compared to when he came in.  He was easily arousable and answers questions appropriately.  He was oriented x3 but did say the year was 2021.   Patient is supposed to be taking lactulose 30 mL 3 times  daily, rifaximin 550 mg twice daily at home, Lasix 20 mg as needed and spironolactone 52m bid at home.   CT abd June 03 2019 showed cholelithiais, mild gallbladder wall thickening, mesenteric edema, no splenomegaly.  No significant changes in hb to suggest an acute bleed. However, the patient has a new baseline hb over the past few months ranging 8-9, but his previous hb was 11-13.   UA and CXR not suggestive of infection. Is afebrile. No significant electrolyte disturbances. Not on any sedating meds.  Several possible precipitating factors include- he has a history of poor adherence to maintenance medication (lactulose, rifaximin). He continues to drink alcohol (last drink was smirnoff on 06/28/19). Patient may be intravascularly depleted.   -RUQ ultrasound to look for possible thrombus  -Patient should avoid alcohol completely- case management consult to discuss this  -Will need EGD at some point to look for varices  -Continue lactulose titrated to get 2-3 bowel movements daily -Continue rifaximin 550 mg twice daily -continue spironolactone 268mbid  -will start lasix 2037md -FOBT ordered -Patient should be set up with home health -patient should not drive   #Hypotension  #AKI: Patient's blood pressure dropped to 90s67Hstolic while in the ED. He also has an increase in his creatinine to 1.61 from his baseline of 1.2.  UA and UDS were unremarkable. -requested RN to recheck blood pressure and if continues to have low MAP give 1L LR bolus   #HIV: Last viral load was 5480  & CD4 count 110 on 06/25/2019 [facility] supposed to be taking Tivicay and symtuza, but reportedly not been adherent. On prophylaxis for PCP with bactrim, valacyclovir, and fluconazole due to history of cryptococcal meningitis.  -HIV integrase and HIV genotype pending -Continue Bactrim 400-80 mg daily -Continue valacyclovir 1000 mg (MWF) -Has [facility] home health nurse AmbKinnie Scalesho has been working with patient on making sure he gets his medication.  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  Hep B DNA level 1150 in November 2018 and 1.3 log 10 in January 2020. Has hep C genotype 1a, viral load [id number] antibody >11 in January 2020. On PT and INR were 22.4 and 2.0 respectively.labs this morning. Showed a slight increase in the PT to 24.3 and INR was stable at 2.2 on AM labs. On heparin injections. - Per Dr. GesCelesta Averst outpatient, note on 7/2 concerns for potential esophogeal variances. Reached out to GI on admission, and they are not going to do an EGD during this hospitalization.  He will follow this up in the outpatient setting. - Patient should continue  following with ID for treatment of his chronic hepatitis B and C and may need to follow with gastroenterology/hepatology (Dr. Carlean Purl)  #Ankle pain  -5 mg oxycodone IR every 12 as needed  #Seizure disorder: Follows with [facility]-Dr. Leta Baptist. Was initially thought to be caused by cryptococcal meningitis. Last seizure was one year ago. -Continue Keppra 1019m twice daily  #Bipolar 1 disorder -Continue Quetiapine 234mBID  #Bed Bugs?: Patient initially presented with excoriations and lesions that look like bug bites all over his body per initial H&P.  When asking the patient he says they are from mosquito bites.  Denies having bedbugs at his house. -Continue contact precautions -Confirmed with grandmother  #DVT prophylaxis: Of note patient has intermittently rejected some of the injections.  He received 1 dose last night but refused this morning we will continue to monitor. - Heparin injections ready sounds fantastic  #Hypokalemia #Hypomagnesemia #FEN/GI: K+ was 3.3 and MG was 1.6 on admission.  Patient notes that he feels hungry and wants to eat. -Replete  electrolytes as needed -SLP currently evaluating, will place diet order pending clearance.  Dispo: Anticipated discharge in approximately 2-3 day(s).   AnEarlene PlaterMD Internal Medicine, PGY1 Pager: [phone]/10/2019,9:37 AM

## 2019-07-05 NOTE — Evaluation (Signed)
Physical Therapy Evaluation Patient Details Name: Justin Ball MRN: [id number] DOB: 07/03/1991 Today's Date: 07/05/2019   History of Present Illness  28 yo man with complex medical history admitted for leg swelling for 2 weeks. Also has worsening confusion. He has uncontrolled HIV with AIDS, history of cryptococcal meningitis, seizures, chronic Hep B and C, and alcoholic liver damage. Admitted 7/10 with hepatic encephalopathy with decompensated cirrhosis.  Clinical Impression  PTA pt living with grandmother in single story home with level entry. Pt reports independence in mobility and iADLS, however requires increased time and questioning to provide that information. Pt is limited in safe mobility by decrease balance due in part to decreased proprioception of foot placement due to edema in bilateral feet contributing to decreased balance, as well as decreased safety awareness. Pt is independent for bed mobility, supervision for transfers and ambulation with RW. Pt requires hands on min guard for ambulation without AD. PT will continue to work with pt acutely on safe mobility, however pt will not need any additional PT at d/c.      Follow Up Recommendations No PT follow up;Supervision for mobility/OOB    Equipment Recommendations  Rolling walker with 5" wheels(pt states he does not want RW)       Precautions / Restrictions Precautions Precautions: None Restrictions Weight Bearing Restrictions: No      Mobility  Bed Mobility Overal bed mobility: Independent                Transfers Overall transfer level: Needs assistance Equipment used: None Transfers: Sit to/from Stand Sit to Stand: Supervision         General transfer comment: supervision for safety   Ambulation/Gait Ambulation/Gait assistance: Min guard;Supervision Gait Distance (Feet): 250 Feet Assistive device: Rolling walker (2 wheeled);None   Gait velocity: slowed Gait velocity interpretation: 1.31 - 2.62  ft/sec, indicative of limited community ambulator General Gait Details: supervision for ambulation with RW, ambulated 150 feet without AD and hands on min guard, pt with decreased stability, no overt LoB, pt states swelling in feet making him unbalanced      Balance Overall balance assessment: Needs assistance Sitting-balance support: Feet supported;No upper extremity supported Sitting balance-Leahy Scale: Good     Standing balance support: During functional activity;No upper extremity supported Standing balance-Leahy Scale: Fair                               Pertinent Vitals/Pain Pain Assessment: 0-10 Pain Score: 4  Pain Location: feet especially with pressure Pain Descriptors / Indicators: Pressure;Throbbing;Aching Pain Intervention(s): Limited activity within patient's tolerance;Monitored during session;Repositioned    Home Living Family/patient expects to be discharged to:: Private residence Living Arrangements: Other relatives(grandmother) Available Help at Discharge: Family;Available 24 hours/day Type of Home: House Home Access: Level entry     Home Layout: One level Home Equipment: None      Prior Function Level of Independence: Independent                  Extremity/Trunk Assessment   Upper Extremity Assessment Upper Extremity Assessment: Overall WFL for tasks assessed    Lower Extremity Assessment Lower Extremity Assessment: RLE deficits/detail;LLE deficits/detail RLE Deficits / Details: hip and knee ROM WFL, ankle dorsi/plantarflexion limited by edema, strength grossly assess with movement 4/5 RLE Sensation: WNL RLE Coordination: WNL LLE Deficits / Details: hip and knee ROM WFL, ankle dorsi/plantarflexion limited by edema, strength grossly assess with movement 4/5 LLE Sensation:  WNL LLE Coordination: WNL       Communication   Communication: No difficulties  Cognition Arousal/Alertness: Awake/alert Behavior During Therapy: WFL for  tasks assessed/performed Overall Cognitive Status: Impaired/Different from baseline Area of Impairment: Problem solving;Awareness                           Awareness: Emergent Problem Solving: Slow processing;Decreased initiation;Difficulty sequencing;Requires verbal cues;Requires tactile cues General Comments: Pt answers direct questions and does not ellaborate, also requires increased time for processing commands      General Comments General comments (skin integrity, edema, etc.): VSS        Assessment/Plan    PT Assessment Patient needs continued PT services  PT Problem List Decreased balance;Decreased activity tolerance       PT Treatment Interventions DME instruction;Gait training;Functional mobility training;Therapeutic activities;Therapeutic exercise;Balance training;Cognitive remediation    PT Goals (Current goals can be found in the Care Plan section)  Acute Rehab PT Goals Patient Stated Goal: go home PT Goal Formulation: With patient Time For Goal Achievement: 07/19/19 Potential to Achieve Goals: Good    Frequency Min 3X/week    AM-PAC PT "6 Clicks" Mobility  Outcome Measure Help needed turning from your back to your side while in a flat bed without using bedrails?: None Help needed moving from lying on your back to sitting on the side of a flat bed without using bedrails?: None Help needed moving to and from a bed to a chair (including a wheelchair)?: None Help needed standing up from a chair using your arms (e.g., wheelchair or bedside chair)?: None Help needed to walk in hospital room?: A Little Help needed climbing 3-5 steps with a railing? : A Little 6 Click Score: 22    End of Session Equipment Utilized During Treatment: Gait belt Activity Tolerance: Patient tolerated treatment well Patient left: in bed;with call bell/phone within reach Nurse Communication: Mobility status PT Visit Diagnosis: Unsteadiness on feet (R26.81);Other  abnormalities of gait and mobility (R26.89);Difficulty in walking, not elsewhere classified (R26.2)    Time: [id number] PT Time Calculation (min) (ACUTE ONLY): 14 min   Charges:   PT Evaluation $PT Eval Moderate Complexity: 1 Mod          Zyara Riling B. Beverely RisenVan Fleet PT, DPT Acute Rehabilitation Services Pager [phone] Office [phone]   Elon Alaslizabeth B Van Fleet 07/05/2019, 12:21 PM

## 2019-07-05 NOTE — Plan of Care (Signed)
  Problem: Pain Managment: Goal: General experience of comfort will improve Outcome: Not Progressing  C/O constant pain. Medication provided as ordered. 

## 2019-07-06 DIAGNOSIS — M25571 Pain in right ankle and joints of right foot: Secondary | ICD-10-CM

## 2019-07-06 LAB — COMPREHENSIVE METABOLIC PANEL
ALT: 56 U/L — ABNORMAL HIGH (ref 0–44)
AST: 149 U/L — ABNORMAL HIGH (ref 15–41)
Albumin: 1.7 g/dL — ABNORMAL LOW (ref 3.5–5.0)
Alkaline Phosphatase: 151 U/L — ABNORMAL HIGH (ref 38–126)
Anion gap: 7 (ref 5–15)
BUN: 7 mg/dL (ref 6–20)
CO2: 22 mmol/L (ref 22–32)
Calcium: 8.9 mg/dL (ref 8.9–10.3)
Chloride: 104 mmol/L (ref 98–111)
Creatinine, Ser: 1.66 mg/dL — ABNORMAL HIGH (ref 0.61–1.24)
GFR calc Af Amer: >60 mL/min (ref >60)
GFR calc non Af Amer: 55 mL/min — ABNORMAL LOW (ref >60)
Glucose, Bld: 97 mg/dL (ref 70–99)
Potassium: 3.1 mmol/L — ABNORMAL LOW (ref 3.5–5.1)
Sodium: 133 mmol/L — ABNORMAL LOW (ref 135–145)
Total Bilirubin: 7.4 mg/dL — ABNORMAL HIGH (ref 0.3–1.2)
Total Protein: 9.3 g/dL — ABNORMAL HIGH (ref 6.5–8.1)

## 2019-07-06 LAB — CBC
HCT: 24.4 % — ABNORMAL LOW (ref 39.0–52.0)
Hemoglobin: 8.5 g/dL — ABNORMAL LOW (ref 13.0–17.0)
MCH: 34.4 pg — ABNORMAL HIGH (ref 26.0–34.0)
MCHC: 34.8 g/dL (ref 30.0–36.0)
MCV: 98.8 fL (ref 80.0–100.0)
Platelets: 95 10*3/uL — ABNORMAL LOW (ref 150–400)
RBC: 2.47 MIL/uL — ABNORMAL LOW (ref 4.22–5.81)
RDW: 15.6 % — ABNORMAL HIGH (ref 11.5–15.5)
WBC: 3.2 10*3/uL — ABNORMAL LOW (ref 4.0–10.5)
nRBC: 0 % (ref 0.0–0.2)

## 2019-07-06 LAB — PROTIME-INR
INR: 2.1 — ABNORMAL HIGH (ref 0.8–1.2)
Prothrombin Time: 23.3 seconds — ABNORMAL HIGH (ref 11.4–15.2)

## 2019-07-06 LAB — MAGNESIUM: Magnesium: 1.7 mg/dL (ref 1.7–2.4)

## 2019-07-06 MED ORDER — POTASSIUM CHLORIDE CRYS ER 20 MEQ PO TBCR
40.0000 meq | EXTENDED_RELEASE_TABLET | Freq: Once | ORAL | Status: AC
  Administered 2019-07-06: 40 meq via ORAL
  Filled 2019-07-06: qty 2

## 2019-07-06 NOTE — Progress Notes (Signed)
Spoke to the grandmother for 25 minutes. She says the patient has acting like himself for the most part, and doesn't think he was too confused. On Tuesday, he did have a mechanical fall, where he was trying to make it to the bathroom and tripped over an object usually steady on his feet and he rarely falls.   He is not independent. She says she needs help to remind him to do personal hygiene and to clean up after himself. Depends on her for cooking and cleaning. It sounds like he is physical able to function, but chooses to depend on his grandma or others to do it for him.   There was a lady that comes from a "sitting agency"  that came to the house to watch over him and help the grandma, but his "attitude was so poor that they stopped coming by." They were around for 6 months but stopped 3 weeks ago.    Was recently visting his sister's house who has bedbugs. Says the breaking skin on his shins for awhile.   Earlene Plater, MD Internal Medicine, PGY1 Pager: [phone]  07/06/2019,3:07 PM

## 2019-07-06 NOTE — Progress Notes (Signed)
Subjective: Patient seen on morning rounds today.  Alert and Oriented x 3. Feels tired today. He continues to have ankle pain. Denies abdominal pain, nausea, and vomiting. States lactulose tastes bad, we discussed possibility of reducing dose once he has 2-3 bowl movements per day. He feels he is thinking clearly. No other complaints at this time.   Objective: CXR-7/10: IMPRESSION: Unremarkable  RUQ US - 7/10: IMPRESSION: 1. Gallbladder contracted with slight pericholecystic fluid and mildly edematous appearance. Gallstones seen on recent CT are not appreciable in this contracted gallbladder. Findings are felt to be concerning for potential degree of cholecystitis. In this regard, it may be prudent to consider nuclear medicine hepatobiliary imaging study to assess for cystic duct patency. 2. Increased liver echogenicity, a finding felt to be indicative of hepatic steatosis. No focal liver lesions evident.  Vital signs in last 24 hours: Vitals:   07/05/19 1438 07/05/19 1825 07/05/19 2138 07/06/19 0519  BP: 109/66 120/70 111/69 124/64  Pulse: 89  87 96  Resp: 18  18 18   Temp: 97.9 F (36.6 C) 97.9 F (36.6 C) 98.1 F (36.7 C) 98.3 F (36.8 C)  TempSrc: Oral Oral Oral Oral  SpO2: 100% 99% 100% 98%  Weight:      Height:       Physical Exam Vitals signs reviewed.  Constitutional:      General: He is not in acute distress.    Appearance: Normal appearance. He is normal weight. He is not ill-appearing, toxic-appearing or diaphoretic.  HENT:     Head: Normocephalic and atraumatic.     Mouth/Throat:     Mouth: Mucous membranes are moist.     Pharynx: No oropharyngeal exudate or posterior oropharyngeal erythema.  Eyes:     General: No scleral icterus.       Right eye: No discharge.        Left eye: No discharge.     Extraocular Movements: Extraocular movements intact.  Cardiovascular:     Rate and Rhythm: Normal rate and regular rhythm.     Pulses: Normal pulses.   Heart sounds: Normal heart sounds. No murmur. No friction rub. No gallop.   Pulmonary:     Effort: Pulmonary effort is normal. No respiratory distress.     Breath sounds: Normal breath sounds. No wheezing or rales.  Abdominal:     General: Abdomen is flat. Bowel sounds are normal. There is no distension.     Palpations: Abdomen is soft.     Tenderness: There is no abdominal tenderness. There is no guarding.  Musculoskeletal:     Right lower leg: Edema present.     Left lower leg: Edema present.     Comments: Bilateral ankle pain.  Tender to palpation bilaterally.  Skin:    General: Skin is warm.     Findings: Lesion (Bilateral excoriations and bug bite appearing lesions) present.  Neurological:     General: No focal deficit present.     Mental Status: He is alert and oriented to person, place, and time.     Cranial Nerves: No cranial nerve deficit.     Sensory: No sensory deficit.     Motor: No weakness.  Psychiatric:        Mood and Affect: Mood normal.        Behavior: Behavior normal.        Thought Content: Thought content normal.        Judgment: Judgment normal.    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 Leaf is a 28 y.o male with a PMHx significant forAIDS 2/2 uncontrolled HIV (viral load 5480 & CD4 count 110 on 06/25/2019) on Symtuza and Tivicay, cryptococcal meninginitis, chronic viral hep B, alcoholic liver damage, [id number], seizure disorderwho presents with two week history of bilateral lower extremity swelling and tenderness and increased confusion and AMS, suspected to be due to hepatic decompensation. He recently established care with GI, Dr. Carlean Purl on 7/1 for which he was prescribed   rifaximin and lactulose, but wasn't able to take it due to prior authorization that was not obtained.  Right upper quadrant ultrasound was significant for slight pericholecystic fluid and mildly edematous gallbladder. Exam on 7/11 was reassuring that he does not have cholecystitis.   Of note, I was able to have a conversation with the patient's grandmother who he lives at home.  She says he has been acting normal, may be a "little off".  He primarily came into the hospital for bilateral shin pain and swelling.   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 Hep B DNA level 1150in November 2018 and 1.3 log 10 in January 2020. Has hep C genotype 1a, viral load9670000,HCV antibody >11 in January 2020. On PT and INR were 22.4 and 2.0 respectively.labs this morning. Showed a slight increase in the PT to 24.3 and INR was stable at 2.2 on AM labs.  - Per Dr. Celesta Aver last outpatient, note on 7/2 concerns for potential esophogeal variances. Reached out to GI on admission, and they are not going to do an EGD during this hospitalization.  He will follow this up in the outpatient setting. - Patient should continue following with ID for treatment of his chronic hepatitis B and C and may need to follow with gastroenterology/hepatology(Dr. Carlean Purl)  #AMS secondary to hepatic encephalopathy: Patient has bilateral lower extremity swelling,ascites,andencephalopathywhich may be due to worsening cirrhosis.Transaminitis improving. Patient's is oriented x3 today. The patient is supposedto be taking lactulose 30 mL 3 times daily, rifaximin 550 mg twice daily at home, but wasn't able to take the rifaximin due to cost. Taking it now as inpatient. RUQ [place] significant for hepatic steatosis. -Patient encouraged to avoid alcohol  -Recent GI note noted that they were thinking about doing an EGD to  evaluate for varices.  They are not going to do it this admission.  Will follow-up as an outpatient. -Continue lactulose titrated to get 2-3 bowel movements daily -Continue rifaximin 550 mg twice daily  #Ankle pain  #Lower extremity edema - Oxycodone IR 5 mg every 12 as needed - Continue Lasix 20mg , Spironolactone 25 mg BID   #Hypotension  #AKI: Patient's blood pressure dropped to 15Q systolic while in the ED. Creatinine at 1.66 today, baseline around 1.2. - monitor with daily BMPs. - encourage patient to drink plenty of fluids to stay hydrated  #Hypokalemia #Hypomagnesemia #FEN/GI: K+ was 3.3 and MG was 1.6 on admission. Appetite present, wants to keep eating. -Replete electrolytes as needed  #HIV: Last viral load was5480 & CD4 count 110 on 06/25/2019 Patient is following with Dr.Van Dam at [facility]. He is supposed to be taking Tivicay and symtuza,  but reportedly not been adherent. On prophylaxis for PCP with bactrim,valacyclovir,and fluconazole due to history of cryptococcal meningitis. -Continue Bactrim 400-80 mg daily -Continue valacyclovir1000 mg (MWF) - Has [facility] home health nurse Lind Covertmbre McNeil who has been working with patient on making sure he gets his medication.  #Seizure disorder: Follows with [facility]-Dr. Marjory LiesPenumalli. Was initially thought to be caused by cryptococcal meningitis. Last seizure was one year ago. -Continue Keppra1000mg twice daily  #Bipolar 1 disorder -Continue Quetiapine 25mg  BID  #Bed Bugs: Patient initially presented with excoriations and lesions that look like bug bites all over his body per initial H&P.  When asking the patient he says they are from mosquito bites. Grandmother says she does not have bedbugs at her place, but her sister has them.  He was at his sister's house right before coming to the hospital. - Contact precautions have been discontinued at this time  #DVT prophylaxis: Of note patient has intermittently rejected some of the  injections.  He received 1 dose last night but refused this morning we will continue to monitor. - Heparin subQ  #Dispo: Anticipated discharge in approximately 1-2 days. Grandmother will pick him up. [phone]  Kirt BoysAndrew Dottie Vaquerano, MD Internal Medicine, PGY1 Pager: [phone]  07/06/2019,2:36 PM

## 2019-07-06 NOTE — Care Management (Signed)
Patient provided with resources for outpatient substance and ETOH counseling.

## 2019-07-06 NOTE — Plan of Care (Signed)
  Problem: Pain Managment: Goal: General experience of comfort will improve Outcome: Not Progressing  Patient c/o pain. Meds given as ordered.

## 2019-07-07 ENCOUNTER — Ambulatory Visit: Payer: Medicaid Other

## 2019-07-07 ENCOUNTER — Telehealth: Payer: Self-pay | Admitting: *Deleted

## 2019-07-07 DIAGNOSIS — K7460 Unspecified cirrhosis of liver: Secondary | ICD-10-CM

## 2019-07-07 DIAGNOSIS — R188 Other ascites: Secondary | ICD-10-CM

## 2019-07-07 LAB — COMPREHENSIVE METABOLIC PANEL
ALT: 56 U/L — ABNORMAL HIGH (ref 0–44)
AST: 152 U/L — ABNORMAL HIGH (ref 15–41)
Albumin: 1.7 g/dL — ABNORMAL LOW (ref 3.5–5.0)
Alkaline Phosphatase: 155 U/L — ABNORMAL HIGH (ref 38–126)
Anion gap: 9 (ref 5–15)
BUN: 7 mg/dL (ref 6–20)
CO2: 21 mmol/L — ABNORMAL LOW (ref 22–32)
Calcium: 8.5 mg/dL — ABNORMAL LOW (ref 8.9–10.3)
Chloride: 102 mmol/L (ref 98–111)
Creatinine, Ser: 1.71 mg/dL — ABNORMAL HIGH (ref 0.61–1.24)
GFR calc Af Amer: >60 mL/min (ref >60)
GFR calc non Af Amer: 53 mL/min — ABNORMAL LOW (ref >60)
Glucose, Bld: 101 mg/dL — ABNORMAL HIGH (ref 70–99)
Potassium: 3.6 mmol/L (ref 3.5–5.1)
Sodium: 132 mmol/L — ABNORMAL LOW (ref 135–145)
Total Bilirubin: 7.3 mg/dL — ABNORMAL HIGH (ref 0.3–1.2)
Total Protein: 9.3 g/dL — ABNORMAL HIGH (ref 6.5–8.1)

## 2019-07-07 LAB — CBC
HCT: 25.5 % — ABNORMAL LOW (ref 39.0–52.0)
Hemoglobin: 8.9 g/dL — ABNORMAL LOW (ref 13.0–17.0)
MCH: 34.6 pg — ABNORMAL HIGH (ref 26.0–34.0)
MCHC: 34.9 g/dL (ref 30.0–36.0)
MCV: 99.2 fL (ref 80.0–100.0)
Platelets: 102 10*3/uL — ABNORMAL LOW (ref 150–400)
RBC: 2.57 MIL/uL — ABNORMAL LOW (ref 4.22–5.81)
RDW: 15.5 % (ref 11.5–15.5)
WBC: 3.6 10*3/uL — ABNORMAL LOW (ref 4.0–10.5)
nRBC: 0 % (ref 0.0–0.2)

## 2019-07-07 LAB — PROTIME-INR
INR: 2 — ABNORMAL HIGH (ref 0.8–1.2)
Prothrombin Time: 22.1 seconds — ABNORMAL HIGH (ref 11.4–15.2)

## 2019-07-07 LAB — MAGNESIUM: Magnesium: 1.5 mg/dL — ABNORMAL LOW (ref 1.7–2.4)

## 2019-07-07 MED ORDER — DICLOFENAC SODIUM 1 % TD GEL: 2.0000 g | Freq: Four times a day (QID) | TRANSDERMAL | 0 refills | Status: AC | PRN

## 2019-07-07 MED ORDER — NICOTINE 21 MG/24HR TD PT24
21.0000 mg | MEDICATED_PATCH | Freq: Every day | TRANSDERMAL | Status: DC
  Administered 2019-07-07: 21 mg via TRANSDERMAL
  Filled 2019-07-07 (×2): qty 1

## 2019-07-07 MED ORDER — LACTULOSE 10 GM/15ML PO SOLN: 30.0000 g | Freq: Three times a day (TID) | ORAL | 0 refills | Status: AC

## 2019-07-07 MED ORDER — NICOTINE 21 MG/24HR TD PT24: 21.0000 mg | MEDICATED_PATCH | Freq: Every day | TRANSDERMAL | 0 refills | Status: AC

## 2019-07-07 MED ORDER — DICLOFENAC SODIUM 1 % TD GEL
2.0000 g | Freq: Four times a day (QID) | TRANSDERMAL | Status: DC | PRN
  Filled 2019-07-07: qty 100

## 2019-07-07 MED FILL — LACTULOSE 10 GM/15 ML SOLUT: 10 | 2 days supply | Qty: 236 | Fill #0

## 2019-07-07 MED FILL — NICOTINE 21 MG/24HR PATCH: 21 | 28 days supply | Qty: 28 | Fill #0

## 2019-07-07 NOTE — Discharge Instructions (Signed)
Thank you for allowing [place] to take care of you during her hospitalization.  Below is a summary of what we treated:  1.  Hepatic encephalopathy: Your liver is chronically injured due to chronic hepatitis B and C infections.  The liver enzymes were elevated, higher than usual and sometimes may cause confusion and unclear thinking.  We treated you with lactulose and rifaximin in the hospital.  These medications can make you have more frequent bowel movements.  Talk about these medicines with your primary care provider. -Take lactulose 45 mL's (30g total) by mouth 3 times a day.  You may mix this with juice or soda if you do not like the taste.  It is important to take this medicine every day. -Take rifaximin 550 mg tablets by mouth twice a day.  Please follow-up with your GI doctor about getting this medication authorized so it is not so expensive.  2. HIV/AIDS -Continue taking your home medications as you did before.  This includes Symtuza, Tivicay, Valacyclovir, Bactrim, and Fluconazole.  If you have any questions or concerns about these medications, follow-up with your primary care doctor.  3.  Ankle Pain - Voltaren gel apply 2 g topically up to 4 times a day as needed for your shin and ankle pain. -Follow-up with your primary care doctor if you have any questions about this medication or if you need a different type of medication for pain.

## 2019-07-07 NOTE — Progress Notes (Signed)
Subjective: Patient reports he has been up walking and says his feet are hurting him. Receptive to being discharged with follow up in clinic in the outpatient setting.  Objective:  Vital signs in last 24 hours: Vitals:   07/06/19 0519 07/06/19 1704 07/06/19 2100 07/07/19 0422  BP: 124/64 120/79 118/74 116/71  Pulse: 96 89 99 (!) 101  Resp: 18  18 18   Temp: 98.3 F (36.8 C) 98.5 F (36.9 C) 98.3 F (36.8 C) 98.4 F (36.9 C)  TempSrc: Oral Oral Oral Oral  SpO2: 98% 100% 99% 100%  Weight:      Height:       Physical Exam Constitutional:      General: He is not in acute distress.    Appearance: Normal appearance. He is normal weight. He is not ill-appearing, toxic-appearing or diaphoretic.  HENT:     Head: Normocephalic and atraumatic.     Mouth/Throat:     Mouth: Mucous membranes are moist.  Eyes:     General: No scleral icterus.       Right eye: No discharge.        Left eye: No discharge.     Extraocular Movements: Extraocular movements intact.  Cardiovascular:     Rate and Rhythm: Normal rate and regular rhythm.     Pulses: Normal pulses.     Heart sounds: No murmur (Blowing systolic murmur appreciated in the left lower sternal border radiating to the axilla).  Neurological:     Mental Status: He is alert.    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 Carlean Purl on 7/1 for which he was prescribed rifaximin and lactulose, but wasn't able to take it due to prior authorization that was not obtained. Right upper quadrant ultrasound was significant for slight pericholecystic fluid and mildly edematous gallbladder.Exam on 7/11 was reassuring that he doesnot have cholecystitis.  Of note, I was able to have a conversation with the patient's grandmother who he lives at home.  She says he has been acting normal, may be a "little off".  He primarily came into the hospital for bilateral shin pain and swelling.  The pain and swelling in his lower extremities is minorly improved.  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 Hep B DNA level 1150in November 2018 and 1.3 log 10 in January 2020. Has hep C genotype 1a, viral load9670000,HCV antibody >11 in January 2020.On PT and INR were 22.4 and 2.0 respectively.labs this morning.Showed a slight increase in the PT to 24.3 and INR was stable at 2.2on AM labs.  -Per Dr. Winnifred Friar 7/2 concerns for potential esophogeal variances.Reached out to GI on admission, and they are not going to do an EGD during this hospitalization. He will follow this up in theoutpatientsetting. - Patient  should continue following withIDfor treatment of his chronic hepatitis B and C and may need to follow with gastroenterology/hepatology(Dr. Leone PayorGessner)  #AMS secondary to hepatic encephalopathy: Patient has bilateral lower extremity swelling,ascites,andencephalopathywhich may be due to worsening cirrhosis.Transaminitis elevated but stable. Patient's continues to be oriented x3 today. The patient is supposedto be taking lactulose 30 mL 3 times daily, rifaximin 550  mg twice daily at home, but wasn't able to take the rifaximin due to cost. Taking it now as inpatient. RUQ US significant for hepatic steatosis. -Patient encouraged to avoid alcohol  -Recent GI note noted that they were thinking about doing an EGD to evaluate for varices.  They are not going to do it this admission. Will follow-up as an outpatient. -Continue lactulose titrated to get 2-3 bowel movements daily -Continue rifaximin 550 mg twice daily  #Ankle pain  #Lower extremity edema: Continues to be tender to light touch.  Patient says pain is about the same as when he came in. - Oxycodone IR 5 mg every 12 as needed -Start Voltaren gel as needed today - Continue Lasix 20mg  PRN, Spironolactone 25 mg BID   #Hypotension #AKI:Patient's blood pressure dropped to 90s systolic while in the ED. Creatinine at 1.66 today, baseline around 1.2. - monitor with daily BMPs. - encourage patient to drink plenty of fluids to stay hydrated  #Hypokalemia #Hypomagnesemia #FEN/GI:K+ was 3.3 and MG was 1.6 on admission. Appetite present, wants to keep eating. -Repleteelectrolytesas needed - Oral magnesium today   #HIV: Last viral load was5480&CD4 count 110 on 7/1/2020[facility] He is supposed to be taking Tivicay and symtuza, butreportedlynot been adherent. Onprophylaxis for PCP with bactrim,valacyclovir,and fluconazole due to history of cryptococcal meningitis. -Continue Bactrim 400-80 mg daily -Continue valacyclovir1000 mg(MWF) - Has [facility] home health nurse Lind Covertmbre McNeil who has been working with patient on making sure he gets his medication.  #Seizure disorder:Follows with [facility]-Dr. Marjory LiesPenumalli. Was initially thought to be caused by cryptococcal meningitis. Last seizure was one year ago. -Continue Keppra1000mg twice daily  #Bipolar 1 disorder -Continue Quetiapine 25mg BID  #Bed Bugs:Patient initially presented with excoriations  and lesions that look like bug bites all over his body per initial H&P. When asking the patient he says they are from mosquito bites. Grandmother says she does not have bedbugs at her place, but her sister has them.  He was at his sister's house right before coming to the hospital. - Contact precautions have been discontinued at this time  #DVT prophylaxis:Of note patient has intermittently rejected some of the injections. He received 1 dose last night but refused this morning we will continue to monitor. - Heparin subQ  #Dispo: Anticipated discharge today. Grandmother will pick him up. [phone])  Kirt BoysAndrew Seleste Tallman, MD Internal Medicine, PGY1 Pager: [phone]  07/07/2019,11:31 AM

## 2019-07-07 NOTE — Consult Note (Signed)
Pharmacy student rounding on the IMTS/B2/Lane service was requested to consult on flavoring options for lactulose oral solution due to patient's reported non-compliance due to taste.   I have evaluated his baseline laboratory values as documented in his history and physical exam.   Based on the package insert the patient can mix his lactulose with fruit juice, water, or milk to help with the displeasing taste of the medication. Patients seem to like mixing it with chocolate milk based on previous clinical experiences. Additionally, per the [employer], an outpatient pharmacy service provided by most chain and independent retail pharmacies, it is possible to directly flavor the lactulose solution. Recommended flavors will vary by location and brand of flavoring machine available. Some of the preferred flavors per [employer] include Apple, Bubblegum, Mango, and Sour [employer]. Additionally, Lactulose solution is also available in a pre-flavored formulation which is cherry flavored.   Due to prior authorization patient has not been able to take Rifaximin at home, this emphasizes the need for lactulose therapy. I would recommend presenting these flavoring options to the patient prior to discharge in hopes of encouraging compliance and preventing hospital re-admission.   [place]

## 2019-07-07 NOTE — Telephone Encounter (Signed)
Patient is currently in the hospital and not able to leave a message for him.

## 2019-07-07 NOTE — Progress Notes (Signed)
Physical Therapy Treatment & Discharge Patient Details Name: Justin Ball MRN: [id number] DOB: 1991-10-15 Today's Date: 07/07/2019    History of Present Illness Pt is a 28 y.o. male admitted 07/04/19 with BLE swelling and AMS; worked up for hepatic encephalopathy, decompensated cirrhosis. PMH includes uncontrolled HIV with AIDS, seizures, Hep B, Hep C, alcoholic liver damage.   PT Comments    Pt progressing well with mobility. Independent with ambulation and ADLs. Remains flat, requiring increased time for some processing; likely baseline cognition. Pt has met short-term acute PT goals. Requesting RW for home use in case BLE/foot pain worsens. Will d/c acute PT.    Follow Up Recommendations  No PT follow up;Supervision for mobility/OOB     Equipment Recommendations  Rolling walker with 5" wheels(pt now requesting RW)    Recommendations for Other Services       Precautions / Restrictions Precautions Precautions: None Restrictions Weight Bearing Restrictions: No    Mobility  Bed Mobility Overal bed mobility: Independent                Transfers Overall transfer level: Independent                  Ambulation/Gait Ambulation/Gait assistance: Independent Gait Distance (Feet): 600 Feet Assistive device: None Gait Pattern/deviations: Step-through pattern;Decreased stride length   Gait velocity interpretation: >2.62 ft/sec, indicative of community ambulatory     Stairs Stairs: (Pt declined)           Wheelchair Mobility    Modified Rankin (Stroke Patients Only)       Balance Overall balance assessment: No apparent balance deficits (not formally assessed)                                          Cognition Arousal/Alertness: Awake/alert Behavior During Therapy: WFL for tasks assessed/performed Overall Cognitive Status: Impaired/Different from baseline                                 General Comments: Pt  requires increased time for processing      Exercises      General Comments General comments (skin integrity, edema, etc.): Pt requesting RW for home use "just in case" foot pain gets bad again      Pertinent Vitals/Pain Pain Assessment: Faces Faces Pain Scale: Hurts a little bit Pain Location: Bilateral feet (L>R) Pain Descriptors / Indicators: Sore Pain Intervention(s): Monitored during session    Home Living                      Prior Function            PT Goals (current goals can now be found in the care plan section) Progress towards PT goals: Goals met/education completed, patient discharged from PT    Frequency    Min 3X/week      PT Plan Current plan remains appropriate    Co-evaluation              AM-PAC PT "6 Clicks" Mobility   Outcome Measure  Help needed turning from your back to your side while in a flat bed without using bedrails?: None Help needed moving from lying on your back to sitting on the side of a flat bed without using bedrails?: None Help needed moving to and from  a bed to a chair (including a wheelchair)?: None Help needed standing up from a chair using your arms (e.g., wheelchair or bedside chair)?: None Help needed to walk in hospital room?: None Help needed climbing 3-5 steps with a railing? : None 6 Click Score: 24    End of Session   Activity Tolerance: Patient tolerated treatment well Patient left: in bed;with call bell/phone within reach Nurse Communication: Mobility status PT Visit Diagnosis: Unsteadiness on feet (R26.81);Other abnormalities of gait and mobility (R26.89);Difficulty in walking, not elsewhere classified (R26.2)     Time: 3943-2003 PT Time Calculation (min) (ACUTE ONLY): 13 min  Charges:  $Gait Training: 8-22 mins                    Mabeline Caras, PT, DPT Acute Rehabilitation Services  Pager [phone] Office [place] 07/07/2019, 10:34 AM

## 2019-07-07 NOTE — Progress Notes (Signed)
Occupational Therapy Treatment Patient Details Name: Justin Ball MRN: [id number] DOB: 03/29/1991 Today's Date: 07/07/2019    History of present illness Pt is a 28 y.o. male admitted 07/04/19 with BLE swelling and AMS; worked up for hepatic encephalopathy, decompensated cirrhosis. PMH includes uncontrolled HIV with AIDS, seizures, Hep B, Hep C, alcoholic liver damage.   OT comments  Pt continues to progress toward established OT goals. Pt requires minguard for simulated tub transfer. Pt completed Medi-cog assessment and scored 1/10. An 8/10 indicates adequate cognitive skills. Pt reports his grandmother takes care of pt's medication management. Spoke with Case Manager regarding updated follow-up recommendation for HHOT to address cognitive impairments and maximize safety and independence with ADL/IADL and functional mobility. Pt will continue to benefit from skilled OT services to maximize safety and independence with ADL/IADL and functional mobility. Will continue to follow acutely and progress as tolerated.    Follow Up Recommendations  Home health OT;Supervision - Intermittent    Equipment Recommendations  None recommended by OT    Recommendations for Other Services      Precautions / Restrictions Precautions Precautions: None Restrictions Weight Bearing Restrictions: No       Mobility Bed Mobility Overal bed mobility: Independent                Transfers Overall transfer level: Modified independent                    Balance Overall balance assessment: Mild deficits observed, not formally tested                                         ADL either performed or assessed with clinical judgement   ADL Overall ADL's : Needs assistance/impaired                                 Tub/ Shower Transfer: Min guard   Functional mobility during ADLs: Min guard General ADL Comments: minguard for safety and minor instability with more  dynamic level balance,      Vision       Perception     Praxis      Cognition Arousal/Alertness: Awake/alert Behavior During Therapy: WFL for tasks assessed/performed Overall Cognitive Status: No family/caregiver present to determine baseline cognitive functioning Area of Impairment: Safety/judgement;Awareness;Problem solving                         Safety/Judgement: Decreased awareness of safety Awareness: Emergent Problem Solving: Slow processing;Decreased initiation;Difficulty sequencing;Requires verbal cues;Requires tactile cues General Comments: Pt scored 1/10 on the Medicog assessment, a score of 8/10 indicates adequate cognitive skills. Pt demonstrated increased difficulty with the clock drawing and medication management;pt unable to fill in steps appropriately despite step by step explanation. Pt reports his grandmother mangages his medication. Feel pt would benefit from OT services to address independence with ADL/IADL such as medication mangagement.         Exercises     Shoulder Instructions       General Comments minguard for higher dynamic level balance, not formally tested;discussed with case manager recommendation for HHOT to address independence with IADL/ADL.     Pertinent Vitals/ Pain       Pain Assessment: No/denies pain Faces Pain Scale: Hurts a little bit Pain Location: Bilateral feet (L>R)  Pain Descriptors / Indicators: Sore Pain Intervention(s): Monitored during session  Home Living                                          Prior Functioning/Environment              Frequency  Min 2X/week        Progress Toward Goals  OT Goals(current goals can now be found in the care plan section)  Progress towards OT goals: Progressing toward goals  Acute Rehab OT Goals Patient Stated Goal: to go home today OT Goal Formulation: With patient Time For Goal Achievement: 07/19/19 Potential to Achieve Goals: Good ADL  Goals Pt Will Perform Grooming: Independently Pt Will Perform Upper Body Dressing: Independently Pt Will Perform Lower Body Dressing: Independently Pt Will Transfer to Toilet: with modified independence;ambulating Pt Will Perform Tub/Shower Transfer: with modified independence  Plan Discharge plan needs to be updated    Co-evaluation                 AM-PAC OT "6 Clicks" Daily Activity     Outcome Measure   Help from another person eating meals?: None Help from another person taking care of personal grooming?: None Help from another person toileting, which includes using toliet, bedpan, or urinal?: A Little Help from another person bathing (including washing, rinsing, drying)?: None Help from another person to put on and taking off regular upper body clothing?: None Help from another person to put on and taking off regular lower body clothing?: None 6 Click Score: 23    End of Session    OT Visit Diagnosis: Other abnormalities of gait and mobility (R26.89);Other symptoms and signs involving cognitive function;Muscle weakness (generalized) (M62.81)   Activity Tolerance Patient tolerated treatment well   Patient Left in bed;with call bell/phone within reach   Nurse Communication Mobility status        Time: 1343-1401 OT Time Calculation (min): 18 min  Charges: OT General Charges $OT Visit: 1 Visit OT Treatments $Self Care/Home Management : 8-22 mins  Dorinda Hill OTR/L [place] Office: [place] 07/07/2019, 2:25 PM

## 2019-07-07 NOTE — Discharge Summary (Signed)
Name: Justin Ball MRN: [id number] DOB: 07-Feb-1991 28 y.o. PCP: Maudie Mercury, MD  Date of Admission: 07/04/2019  3:36 AM Date of Discharge:  Attending Physician: Sid Falcon, MD  Discharge Diagnosis: 1. Altered mental status secondary to hepatic encephalopathy 2.  Chronic hepatitis B & C 3.  HIV/AIDS 4.  Lower extremity edema/ankle pain  Discharge Medications: Allergies as of 07/07/2019      Reactions   Acyclovir And Related Itching      Medication List    TAKE these medications   Darunavir-Cobicisctat-Emtricitabine-Tenofovir Alafenamide 800-150-200-10 MG Tabs Commonly known as: Symtuza Take 1 tablet by mouth daily with breakfast.   diclofenac sodium 1 % Gel Commonly known as: VOLTAREN Apply 2 g topically 4 (four) times daily as needed (bilateral ankle pain).   dolutegravir 50 MG tablet Commonly known as: TIVICAY Take 1 tablet (50 mg total) by mouth daily.   fluconazole 200 MG tablet Commonly known as: DIFLUCAN Take 200 mg by mouth daily.   furosemide 20 MG tablet Commonly known as: Lasix Take 1 tablet (20 mg total) by mouth daily as needed for fluid or edema (leg swellings). Take extra dose of potassium if you take this medication What changed:   when to take this  additional instructions   lactulose 10 GM/15ML solution Commonly known as: CHRONULAC Take 45 mLs (30 g total) by mouth 3 (three) times daily. What changed: how much to take   levETIRAcetam 1000 MG tablet Commonly known as: KEPPRA Take 1 tablet (1,000 mg total) by mouth 2 (two) times daily.   Magnesium Oxide 200 MG Tabs Take 1 tablet (200 mg total) by mouth daily.   nicotine 21 mg/24hr patch Commonly known as: NICODERM CQ - dosed in mg/24 hours Place 1 patch (21 mg total) onto the skin daily. Start taking on: July 08, 2019   ondansetron 4 MG tablet Commonly known as: Zofran Take 1 tablet (4 mg total) by mouth every 6 (six) hours as needed for nausea or vomiting. What changed:  when to take this   potassium chloride SA 20 MEQ tablet Commonly known as: K-DUR Take 1 tablet (20 mEq total) by mouth daily.   QUEtiapine 25 MG tablet Commonly known as: SEROQUEL Take 1 tablet (25 mg total) by mouth 2 (two) times daily as needed (agitation). What changed: when to take this   rifaximin 550 MG Tabs tablet Commonly known as: Xifaxan Take 1 tablet (550 mg total) by mouth 2 (two) times daily.   spironolactone 25 MG tablet Commonly known as: ALDACTONE Take 1 tablet (25 mg total) by mouth 2 (two) times daily.   sulfamethoxazole-trimethoprim 400-80 MG tablet Commonly known as: BACTRIM Take 1 tablet by mouth daily.   valACYclovir 1000 MG tablet Commonly known as: VALTREX Take 1 tablet (1,000 mg total) by mouth every Monday, Wednesday, and Friday.      Disposition and follow-up:   Justin Ball was discharged from [facility] in fair condition.  At the hospital follow up visit please address:  1.  Altered mental status secondary to hepatic encephalopathy 2. Chronic hepatitis B & C 3. HIV/AIDS 4. Lower extremity edema/ ankle pain  2.  Labs / imaging needed at time of follow-up: CBC CMP 3.  Pending labs/ test needing follow-up: none   Follow-up Appointments:   Patient and grandmother notified to schedule follow-up appointment with your primary care provider within 1 to 2 weeks of discharge. PCP is Dr. Vanna Ball Course by problem  list: 1. Altered mental status secondary to hepatic encephalopathy [name] presented to the hospital with a 2-week history of increaRichardson Ball bilateral lower extremity swelling and tenderness in addition to being lethargic. Thought to be encephalopathic secondary to liver injury.  Within a day of admission the patient was fully oriented x3 and did not have asterixis on exam. Upon further investigation and getting collateral from the patient's grandmother, who lives with him at home, she notes that he had not  been confused and was at his baseline mental status.  We treated the patient with rifaximin 550mg  BID and lactulose 30mL TID.  The patient was prescribed rifaximin at outpatient GI with Dr. Leone Ball, but due to prior authorization and insurance coverage he was not able to take this medicine as it was too expensive.  It is still under prior authorization process and will not be available to him upon discharge but will be given lactulose.  Of note, patient had right upper quadrant ultrasound performed on admission which revealed pericholecystic fluid, a mildly edematous gallbladder and hepatic steatosis. The patient had no right upper quadrant pain or signs of cholecystitis during his stay.  2.  Chronic hepatitis B & C Hep B surfaceag,core antibody, Eagpositive. Hep B DNA level 1150in November 2018 and 1.3 log 10 in January 2020. Has hep C genotype 1a, viral load9670000,HCV antibody >11 in January 2020.On PT and INR were 22.4 and 2.0 respectively.labs this morning.Showed a slight increase in the PT to 24.3 and INR was stable at 2.2on AM labs.   The patient follows with GI and ID for management of chronic hepatitis.  Reached out to GI on admission, and they declined to do an EGD to look for varices during this hospitalization.  This was considered at his last outpatient appointment on 7/2.  They will follow-up the need for EGD on an outpatient basis.  3.  HIV/AIDS: About what last viral load was 5480 and CD4 count 110 on 06/25/2019.  Patient is treated by Dr. Daiva EvesVan Ball at our [facility]. Also has home health nurse day lengthy emergency Justin Ball help him with taking his medications. 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  Lower extremity edema/ankle pain: Patient initially presented with bilateral lower extremity pain and swelling.  Persistently was tender to light touch throughout his stay.  We treated this with home oral Lasix 20  mg and spironolactone 25 mg twice daily.  Also gave 5 mg oxycodone IR every 12 as needed in addition to Voltaren gel. -D/C with prescription for Voltaren gel  Discharge Vitals:   BP 116/71 (BP Location: Left Arm)   Pulse (!) 101   Temp 98.4 F (36.9 C) (Oral)   Resp 18   Ht 6\' 1"  (1.854 m)   Wt 83.2 kg   SpO2 100%   BMI 24.20 kg/m   Pertinent Labs, Studies, and Procedures:  US Abdomen limited RUQ: IMPRESSION: 1. Gallbladder contracted with slight pericholecystic fluid and mildly edematous appearance. Gallstones seen on recent CT are not appreciable in this contracted gallbladder. Findings are felt to be concerning for potential degree of cholecystitis. In this regard, it may be prudent to consider nuclear medicine hepatobiliary imaging study to assess for cystic duct patency.  2. Increased liver echogenicity, a finding felt to be indicative of hepatic steatosis. No focal liver lesions evident.  3.  Slight amount of ascites evident.   CBC    Component Value Date/Time   WBC 3.6 (  L) 07/07/2019 0220   RBC 2.57 (L) 07/07/2019 0220   HGB 8.9 (L) 07/07/2019 0220   HCT 25.5 (L) 07/07/2019 0220   HCT 25.0 (L) 10/03/2018 0408   PLT 102 (L) 07/07/2019 0220   MCV 99.2 07/07/2019 0220   MCH 34.6 (H) 07/07/2019 0220   MCHC 34.9 07/07/2019 0220   RDW 15.5 07/07/2019 0220   LYMPHSABS 1.9 07/04/2019 0412   MONOABS 0.4 07/04/2019 0412   EOSABS 0.1 07/04/2019 0412   BASOSABS 0.0 07/04/2019 0412   CMP Latest Ref Rng & Units 07/07/2019 07/06/2019 07/05/2019  Glucose 70 - 99 mg/dL 161(W101(H) 97 76  BUN 6 - 20 mg/dL 7 7 5(L)  Creatinine 9.600.61 - 1.24 mg/dL 4.54(U1.71(H) 9.81(X1.66(H) 9.14(N1.56(H)  Sodium 135 - 145 mmol/L 132(L) 133(L) 135  Potassium 3.5 - 5.1 mmol/L 3.6 3.1(L) 3.2(L)  Chloride 98 - 111 mmol/L 102 104 107  CO2 22 - 32 mmol/L 21(L) 22 20(L)  Calcium 8.9 - 10.3 mg/dL 8.2(N8.5(L) 8.9 5.6(O8.6(L)  Total Protein 6.5 - 8.1 g/dL 9.3(H) 9.3(H) 8.5(H)  Total Bilirubin 0.3 - 1.2 mg/dL 7.3(H) 7.4(H) 6.8(H)   Alkaline Phos 38 - 126 U/L 155(H) 151(H) 143(H)  AST 15 - 41 U/L 152(H) 149(H) 143(H)  ALT 0 - 44 U/L 56(H) 56(H) 55(H)   Discharge Instructions:   Signed: .Kirt BoysAndrew Jaselynn Tamas, MD Internal Medicine, PGY1 Pager: [phone]  07/07/2019,12:41 PM

## 2019-07-07 NOTE — Progress Notes (Signed)
Patient to be discharged home with grandmother. Awaiting transportation at this time.   Nsg Discharge Note  Admit Date:  07/04/2019 Discharge date: 07/07/2019   Hassel Neth to be D/C'd Home per MD order.  AVS completed.  Copy for chart, and copy for patient signed, and dated. Patient/caregiver able to verbalize understanding.  Discharge Medication: Allergies as of 07/07/2019      Reactions   Acyclovir And Related Itching      Medication List    TAKE these medications   Darunavir-Cobicisctat-Emtricitabine-Tenofovir Alafenamide 800-150-200-10 MG Tabs Commonly known as: Symtuza Take 1 tablet by mouth daily with breakfast.   diclofenac sodium 1 % Gel Commonly known as: VOLTAREN Apply 2 g topically 4 (four) times daily as needed (bilateral ankle pain).   dolutegravir 50 MG tablet Commonly known as: TIVICAY Take 1 tablet (50 mg total) by mouth daily.   fluconazole 200 MG tablet Commonly known as: DIFLUCAN Take 200 mg by mouth daily.   furosemide 20 MG tablet Commonly known as: Lasix Take 1 tablet (20 mg total) by mouth daily as needed for fluid or edema (leg swellings). Take extra dose of potassium if you take this medication What changed:   when to take this  additional instructions   lactulose 10 GM/15ML solution Commonly known as: CHRONULAC Take 45 mLs (30 g total) by mouth 3 (three) times daily. What changed: how much to take   levETIRAcetam 1000 MG tablet Commonly known as: KEPPRA Take 1 tablet (1,000 mg total) by mouth 2 (two) times daily.   Magnesium Oxide 200 MG Tabs Take 1 tablet (200 mg total) by mouth daily.   nicotine 21 mg/24hr patch Commonly known as: NICODERM CQ - dosed in mg/24 hours Place 1 patch (21 mg total) onto the skin daily. Start taking on: July 08, 2019   ondansetron 4 MG tablet Commonly known as: Zofran Take 1 tablet (4 mg total) by mouth every 6 (six) hours as needed for nausea or vomiting. What changed: when to take this    potassium chloride SA 20 MEQ tablet Commonly known as: K-DUR Take 1 tablet (20 mEq total) by mouth daily.   QUEtiapine 25 MG tablet Commonly known as: SEROQUEL Take 1 tablet (25 mg total) by mouth 2 (two) times daily as needed (agitation). What changed: when to take this   rifaximin 550 MG Tabs tablet Commonly known as: Xifaxan Take 1 tablet (550 mg total) by mouth 2 (two) times daily.   spironolactone 25 MG tablet Commonly known as: ALDACTONE Take 1 tablet (25 mg total) by mouth 2 (two) times daily.   sulfamethoxazole-trimethoprim 400-80 MG tablet Commonly known as: BACTRIM Take 1 tablet by mouth daily.   valACYclovir 1000 MG tablet Commonly known as: VALTREX Take 1 tablet (1,000 mg total) by mouth every Monday, Wednesday, and Friday.       Discharge Assessment: Vitals:   07/07/19 0422 07/07/19 1313  BP: 116/71 110/67  Pulse: (!) 101 (!) 105  Resp: 18   Temp: 98.4 F (36.9 C) 98 F (36.7 C)  SpO2: 100% 100%   Skin clean, dry and intact without evidence of skin break down, no evidence of skin tears noted. IV catheter discontinued intact. Site without signs and symptoms of complications - no redness or edema noted at insertion site, patient denies c/o pain - only slight tenderness at site.  Dressing with slight pressure applied.  D/c Instructions-Education: Discharge instructions given to patient/family with verbalized understanding. D/c education completed with patient/family including follow up  instructions, medication list, d/c activities limitations if indicated, with other d/c instructions as indicated by MD - patient able to verbalize understanding, all questions fully answered. Patient instructed to return to ED, call 911, or call MD for any changes in condition.  Patient escorted via WC, and D/C home via private auto.  Boykin NearingLydia  Ott Zimmerle, RN 07/07/2019 4:39 PM

## 2019-07-07 NOTE — Telephone Encounter (Signed)
-----   Message from Truman Hayward, MD sent at 07/07/2019 12:04 PM EDT ----- Justin Ball has clearly lost adherence to ARV, I think he should be scheduled w me this week

## 2019-07-08 ENCOUNTER — Telehealth: Payer: Self-pay | Admitting: *Deleted

## 2019-07-08 ENCOUNTER — Telehealth: Payer: Self-pay

## 2019-07-08 MED FILL — XIFAXAN 550 MG TABLET: 550 | 30 days supply | Qty: 60 | Fill #0

## 2019-07-08 NOTE — Telephone Encounter (Signed)
I did a prior authorization for patient's xifaxan 550mg  tablets , one BID for his hepatic encephalopathy K72.90. It was approved thru 07/02/2020. I informed [facility] and they are going to contact his grandmother and see if she wants to pick it up or have it mailed out. PA# [id number]. It is going to cost $3.00.   This was done thru [facility] # [phone], spoke to [place]. Patient's ID # is [id number] P

## 2019-07-08 NOTE — Telephone Encounter (Signed)
Call placed prior to leaving for home visit today. Did not receive an answer. Msg left requesting a call back. Last week we planned to meet again today to refill Justin Ball's pillbox.

## 2019-07-09 NOTE — Telephone Encounter (Signed)
He looks to be DC now. Lets make sure he has FU soon w me he should

## 2019-07-09 NOTE — Telephone Encounter (Signed)
Thanks so much Raoul Pitch can you let me know a bit about what is going on in home if you are able to visit post DC ?

## 2019-07-09 NOTE — Telephone Encounter (Signed)
Called patient to schedule an office visit to see [facility]. He has an appointment 07/17/19.

## 2019-07-11 LAB — CBC WITH DIFFERENTIAL/PLATELET
Absolute Monocytes: 689 cells/uL (ref 200–950)
Basophils Absolute: 48 cells/uL (ref 0–200)
Basophils Relative: 0.9 %
Eosinophils Absolute: 80 cells/uL (ref 15–500)
Eosinophils Relative: 1.5 %
HCT: 25.4 % — ABNORMAL LOW (ref 38.5–50.0)
Hemoglobin: 9.5 g/dL — ABNORMAL LOW (ref 13.2–17.1)
Lymphs Abs: 1044 cells/uL (ref 850–3900)
MCH: 35.6 pg — ABNORMAL HIGH (ref 27.0–33.0)
MCHC: 37.4 g/dL — ABNORMAL HIGH (ref 32.0–36.0)
MCV: 95.1 fL (ref 80.0–100.0)
MPV: 9.4 fL (ref 7.5–12.5)
Monocytes Relative: 13 %
Neutro Abs: 3440 cells/uL (ref 1500–7800)
Neutrophils Relative %: 64.9 %
Platelets: 125 10*3/uL — ABNORMAL LOW (ref 140–400)
RBC: 2.67 10*6/uL — ABNORMAL LOW (ref 4.20–5.80)
RDW: 14.6 % (ref 11.0–15.0)
Total Lymphocyte: 19.7 %
WBC: 5.3 10*3/uL (ref 3.8–10.8)

## 2019-07-11 LAB — COMPLETE METABOLIC PANEL WITH GFR
AG Ratio: 0.4 (calc) — ABNORMAL LOW (ref 1.0–2.5)
ALT: 64 U/L — ABNORMAL HIGH (ref 9–46)
AST: 173 U/L — ABNORMAL HIGH (ref 10–40)
Albumin: 2.4 g/dL — ABNORMAL LOW (ref 3.6–5.1)
Alkaline phosphatase (APISO): 154 U/L — ABNORMAL HIGH (ref 36–130)
BUN: 8 mg/dL (ref 7–25)
CO2: 23 mmol/L (ref 20–32)
Calcium: 8 mg/dL — ABNORMAL LOW (ref 8.6–10.3)
Chloride: 105 mmol/L (ref 98–110)
Creat: 1.2 mg/dL (ref 0.60–1.35)
GFR, Est African American: 95 mL/min/{1.73_m2} (ref >=60)
GFR, Est Non African American: 82 mL/min/{1.73_m2} (ref >=60)
Globulin: 6.5 g/dL (calc) — ABNORMAL HIGH (ref 1.9–3.7)
Glucose, Bld: 74 mg/dL (ref 65–99)
Potassium: 3.8 mmol/L (ref 3.5–5.3)
Sodium: 135 mmol/L (ref 135–146)
Total Bilirubin: 8.8 mg/dL — ABNORMAL HIGH (ref 0.2–1.2)
Total Protein: 8.9 g/dL — ABNORMAL HIGH (ref 6.1–8.1)

## 2019-07-11 LAB — HIV RNA, RTPCR W/R GT (RTI, PI,INT)
HIV 1 RNA Quant: 5480 copies/mL — ABNORMAL HIGH
HIV-1 RNA Quant, Log: 3.74 Log copies/mL — ABNORMAL HIGH

## 2019-07-11 LAB — HIV-1 INTEGRASE GENOTYPE

## 2019-07-11 LAB — RPR: RPR Ser Ql: NONREACTIVE

## 2019-07-11 LAB — HIV-1 GENOTYPE: HIV-1 Genotype: DETECTED — AB

## 2019-07-14 NOTE — Telephone Encounter (Signed)
Prior to his hospital admit I went to the patient's home. The grandmother appears to be involved but Justin Ball appeared very uninterested. She says he is very difficult to deal with, and is the reason why he no longer receives in-home nurse aide services. [name] also stated that the roaches in the home concerned  the nurse aides as well. During our last visit, I filled a pillbox for Justin Ball and we went through and consolidated old pillboxes that his grandmother used to store random pills (about 5 of the same pills were in each pillbox slot). Outside of poor medication adherence, its difficult to get a true understanding of his other barriers. We were suppose to meet last Tuesday but that fell through. I'm hoping to see him this week and can give you a little more information.

## 2019-07-15 MED FILL — FUROSEMIDE 20 MG TABS: 20 | 30 days supply | Qty: 30 | Fill #1

## 2019-07-16 NOTE — Telephone Encounter (Signed)
Thanks so much Ambre!

## 2019-07-17 ENCOUNTER — Ambulatory Visit: Payer: Medicaid Other | Admitting: Infectious Disease

## 2019-07-24 ENCOUNTER — Telehealth: Payer: Self-pay | Admitting: *Deleted

## 2019-07-24 NOTE — Telephone Encounter (Signed)
Call placed today with message left for [name] stating the reason for my call is to offer a home visit with medication pillbox refill.

## 2019-07-25 MED FILL — LACTULOSE 10 GM/15 ML SOLUT: 10 | 31 days supply | Qty: 2838 | Fill #1

## 2019-07-25 MED FILL — valACYclovir HCL 1 GM TABS: 1 | 28 days supply | Qty: 12 | Fill #1

## 2019-07-25 MED FILL — QUETIAPINE 25 MG TABLET: 25 | 30 days supply | Qty: 60 | Fill #2

## 2019-07-25 MED FILL — levETIRAcetam 1000 MG TABS: 1000 | 30 days supply | Qty: 60 | Fill #2

## 2019-07-25 MED FILL — POTASSIUM CHLORIDE CRYS ER: 20 | 30 days supply | Qty: 30 | Fill #1

## 2019-07-25 MED FILL — FUROSEMIDE 20 MG TABS: 20 | 30 days supply | Qty: 30 | Fill #1

## 2019-07-25 MED FILL — FLUCONAZOLE 200 MG TAB: 200 | 30 days supply | Qty: 30 | Fill #2

## 2019-07-25 MED FILL — SYMTUZA 800-150-200-10 MG T: 800-150-200 | 30 days supply | Qty: 30 | Fill #2

## 2019-07-25 MED FILL — TIVICAY 50 MG TABLET: 50 | 30 days supply | Qty: 30 | Fill #2

## 2019-07-28 ENCOUNTER — Telehealth: Payer: Self-pay | Admitting: Internal Medicine

## 2019-07-28 NOTE — Telephone Encounter (Signed)
I agree

## 2019-07-28 NOTE — Telephone Encounter (Signed)
Pts grandmother would like the nurse to call back regarding pt legs [phone]

## 2019-07-28 NOTE — Telephone Encounter (Signed)
To all,  I hope you are doing well. I have an appointment with the patient next Monday, where we can discuss his Lasix usage. The adverse effects of increasing his dosage without appropriate labs could be a kidney injury. I would like to meet the patient to establish care first, before feeling comfortable changing his medications. I hope you have a pleasant day. Thank you all for your help.  Sincerely, Maudie Mercury

## 2019-07-28 NOTE — Telephone Encounter (Signed)
Return call to pt's grandmother, Orbie Pyo - stated pt has mild swelling of legs/feet over the w/e. Pt stated stated they feel better today. But she stated he has been having decreased urine output. Stated he's taking Lasix once a day. She wants to know if it needs to be increased. Please advise. Thanks

## 2019-07-28 NOTE — Telephone Encounter (Signed)
Called pt's grandmother - informed his doctor would prefer to meet pt before making any changes to his medications and pt has an ppt on Monday. She stated "this is a week from now; a long time". But she will continue to elevate his legs and watch his salt intake. Stated his legs are swollen but not throbbing. [facility] appt scheduled Wed 8/5 @ 1445 PM to be evaluated; grandmother informed if pt starts having increased pain/swelling to take to UC/ED or call 911 - voiced understanding.

## 2019-07-30 ENCOUNTER — Other Ambulatory Visit: Payer: Self-pay

## 2019-07-30 ENCOUNTER — Ambulatory Visit: Payer: Medicaid Other | Admitting: Internal Medicine

## 2019-07-30 VITALS — BP 123/82 | HR 97 | Temp 98.2°F | Ht 73.0 in | Wt 193.5 lb

## 2019-07-30 DIAGNOSIS — K7460 Unspecified cirrhosis of liver: Secondary | ICD-10-CM

## 2019-07-30 DIAGNOSIS — R188 Other ascites: Secondary | ICD-10-CM | POA: Diagnosis not present

## 2019-07-30 DIAGNOSIS — K7200 Acute and subacute hepatic failure without coma: Secondary | ICD-10-CM

## 2019-07-30 DIAGNOSIS — Z79899 Other long term (current) drug therapy: Secondary | ICD-10-CM

## 2019-07-30 DIAGNOSIS — B181 Chronic viral hepatitis B without delta-agent: Secondary | ICD-10-CM | POA: Diagnosis not present

## 2019-07-30 DIAGNOSIS — K7290 Hepatic failure, unspecified without coma: Secondary | ICD-10-CM

## 2019-07-30 MED ORDER — SPIRONOLACTONE 50 MG PO TABS: 50.0000 mg | ORAL_TABLET | Freq: Two times a day (BID) | ORAL | 2 refills | Status: AC

## 2019-07-30 MED FILL — SPIRONOLACTONE 50 MG TABS: 50 | 30 days supply | Qty: 60 | Fill #0

## 2019-07-30 NOTE — Patient Instructions (Addendum)
Visit Summary:  It was a pleasure to meet you today. Here is a summary of our visit:  1. Cirrhosis - I have increase you Spironolactone prescription to 50 mg table twice daily. I have discontinue your Potassium medication. We will check your kidney function today and call you with the results. We will reach out to Dr. Arlyss Queen office in regards to your psychiatry referral. Please follow up with your GI doctor as soon as an appointment is available. Call us if you have any questions.  Best regards,   Marianna Payment, DO

## 2019-07-31 LAB — CMP14 + ANION GAP
ALT: 58 IU/L — ABNORMAL HIGH (ref 0–44)
AST: 155 IU/L — ABNORMAL HIGH (ref 0–40)
Albumin/Globulin Ratio: 0.4 — ABNORMAL LOW (ref 1.2–2.2)
Albumin: 2.5 g/dL — ABNORMAL LOW (ref 4.1–5.2)
Alkaline Phosphatase: 208 IU/L — ABNORMAL HIGH (ref 39–117)
Anion Gap: 13 mmol/L (ref 10.0–18.0)
BUN/Creatinine Ratio: 8 — ABNORMAL LOW (ref 9–20)
BUN: 10 mg/dL (ref 6–20)
Bilirubin Total: 8.8 mg/dL — ABNORMAL HIGH (ref 0.0–1.2)
CO2: 18 mmol/L — ABNORMAL LOW (ref 20–29)
Calcium: 8.5 mg/dL — ABNORMAL LOW (ref 8.7–10.2)
Chloride: 106 mmol/L (ref 96–106)
Creatinine, Ser: 1.2 mg/dL (ref 0.76–1.27)
GFR calc Af Amer: 95 mL/min/{1.73_m2} (ref >59)
GFR calc non Af Amer: 82 mL/min/{1.73_m2} (ref >59)
Globulin, Total: 6.9 g/dL — ABNORMAL HIGH (ref 1.5–4.5)
Glucose: 67 mg/dL (ref 65–99)
Potassium: 3.5 mmol/L (ref 3.5–5.2)
Sodium: 137 mmol/L (ref 134–144)
Total Protein: 9.4 g/dL — ABNORMAL HIGH (ref 6.0–8.5)

## 2019-07-31 LAB — MAGNESIUM: Magnesium: 1.5 mg/dL — ABNORMAL LOW (ref 1.6–2.3)

## 2019-07-31 NOTE — Progress Notes (Signed)
   CC: Lower Extremity Edema  HPI:  [name] is a 28 y.o. male with a past medical history stated below and presented for evaluation of his lower extremity edema. Please see problem based assessment and plan for more details.  Past Medical History:  Diagnosis Date  . Aggressive behavior 06/02/2019  . 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 AKI (acute kidney injury) ([place]) 06/19/2019  . 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 Anxiety   . 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 Bipolar 1 disorder ([place])   . Chronic hepatitis C with hepatic coma ([place])   . Chronic viral hepatitis B without coma and with delta agent ([place])   . Cognitive communication deficit    working w/ therapy  . 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 Depression   . 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 Encephalopathy, hepatic ([place]) 06/26/2019  . Fecal incontinence 11/19/2017  . Foley catheter in place   . 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 History of scabies 2013  . History of syphilis   . 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 Nausea and vomiting 01/14/2019  . 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 Neurogenic bowel    wears depends  . Neuromuscular disorder ([place])   . 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 Seizure disorder [facility])    neurologist-  dr Leta Baptist ([place] [facility])--caused by cryptococcal meningitis/ multiple infarctions--  no seizure since hospitalization May 2018  . Status epilepticus ([place])    . URI (upper respiratory infection) 02/24/2019  . Visuospatial deficit    Review of Systems:  Review of Systems  Constitutional: Negative for chills and fever.  Cardiovascular: Positive for leg swelling. Negative for chest pain.  Gastrointestinal: Negative for abdominal pain.  Skin: Positive for itching (picks at skin until it bleeds).     Physical Exam:  Vitals:   07/30/19 1429  BP: 123/82  Pulse: 97  Temp: 98.2 F (36.8 C)  TempSrc: Oral  SpO2: 100%  Weight: 193 lb 8 oz (87.8 kg)  Height: 6\' 1"  (1.854 m)   Physical Exam  Constitutional: He is oriented to person, place, and time.  Eyes: Scleral icterus is present.  Cardiovascular: Normal rate, regular rhythm, normal heart sounds and intact distal pulses. Exam reveals no gallop and no friction rub.  No murmur heard. Pulmonary/Chest: Effort normal. He has no wheezes. He has no rales. He exhibits no tenderness.  Abdominal: Soft. Bowel sounds are normal. He exhibits no shifting dullness, no distension and no fluid wave. There is no abdominal tenderness. There is no guarding.  Musculoskeletal: Normal range of motion.        General: Edema (bilteral lower extremity edema) present.  Neurological: He is alert and oriented to person, place, and time.  Skin: Skin is warm and dry.     Assessment & Plan:   See Encounters Tab for problem based charting.  Patient seen with Dr. Lynnae January

## 2019-08-01 ENCOUNTER — Telehealth: Payer: Self-pay

## 2019-08-01 ENCOUNTER — Telehealth: Payer: Self-pay | Admitting: Pharmacy Technician

## 2019-08-01 ENCOUNTER — Telehealth: Payer: Self-pay | Admitting: *Deleted

## 2019-08-01 ENCOUNTER — Other Ambulatory Visit: Payer: Self-pay

## 2019-08-01 ENCOUNTER — Ambulatory Visit: Payer: Self-pay | Admitting: *Deleted

## 2019-08-01 VITALS — BP 116/68

## 2019-08-01 DIAGNOSIS — B20 Human immunodeficiency virus [HIV] disease: Secondary | ICD-10-CM

## 2019-08-01 DIAGNOSIS — Z9114 Patient's other noncompliance with medication regimen: Secondary | ICD-10-CM

## 2019-08-01 DIAGNOSIS — B180 Chronic viral hepatitis B with delta-agent: Secondary | ICD-10-CM

## 2019-08-01 NOTE — Telephone Encounter (Signed)
Thank you for letting me know. The visit went really well and I hope we can keep it up.

## 2019-08-01 NOTE — Telephone Encounter (Signed)
Uggh

## 2019-08-01 NOTE — Telephone Encounter (Signed)
-----   Message from Loma Boston, RN sent at 08/01/2019  9:23 AM EDT ----- Regarding: RE: medication management I surely will. She has not been returning my calls for the last 2 weeks so I am glad she got in contact with you. ----- Message ----- From: Eugenia Mcalpine, LPN Sent: 12/30/8307   4:32 PM EDT To: Loma Boston, RN Subject: medication management                          Patient's grandmother Collie Siad called and ask if you could come by sometime this week and help her sort out Muhanad's medications. States patient's medications were changed 07/30/19 by PCP and she wants to make sure Abel is getting the right medication.  Thanks for your help

## 2019-08-01 NOTE — Assessment & Plan Note (Addendum)
Patient was recently discharged from the hospital on 07/13 for decompensated cirrhosis with hepatic encephlopathy 2/2 chronic HBV and ETOH liver damage. During the hospital visit the patient complained of bilateral lower extremity edema with significant pain around his ankles. Patient was discharged on Rifaximin 25 mg and lactulose 10 gm/15 mL. Patient states that he takes the  Lactulose 3 times daily and rifaximin 1 time daily (instead of twice daily as it was prescribed) and admits to several bowel movements daily. He was also taking furosemide 20 mg and spironolactone 50 mg daily.   During today's visit the patient continues to complain of lower extremity edema, but with only moderate discomfort. He denies abdominal fullness, bloating or tenderness. During the exam the patient would repeatedly pick at his skin until it bleeds. Although he denies skin itchiness, his cirrhosis could be the cause of his skin irritation. His Grandmother states that the patient has not been disoriented, excessively tired, or shown other signs of AMS change. Patient see Dr. Carlean Purl for cirrhosis management and Dr. Tommy Medal for ID. He has not yet followed up wit Dr. Carlean Purl since being discharged from the hospital.   Plan:  - Contacted Dr. Celesta Aver nurse to schedule a follow up appointment. - Increased the dose of spironolactone from 50 mg daily to 100 mg daily. - D/C oral potassium in light of increasing his spironolactone.   - Will check a CMP today to access kidney function.

## 2019-08-01 NOTE — Telephone Encounter (Signed)
[facility] Patient Advocate Encounter  Patient's grandmother called on 07/25/2019 to request medications stating she was out running errands and would like to pick them up at 4:00pm that same day. Medications were never picked up at [facility]. On July 31, 2019 patient's grandmother picked up Spironolactone and said she didn't need the other medications. States patient is getting medications mailed from somewhere else but cannot tell us what location is mailing them. When we call back, no answer from family member and have to leave a voicemail. It is now time to reverse the nine medications that were prepared on 07/31 due to insurance billing time limits. Historically medications are prepared at the pharmacy and never picked, returned to stock and then a family member shows up weeks later requesting the medications.

## 2019-08-01 NOTE — Progress Notes (Signed)
Internal Medicine Clinic Attending  I saw and evaluated the patient.  I personally confirmed the key portions of the history and exam documented by Dr. Marianna Payment and I reviewed pertinent patient test results.  The assessment, diagnosis, and plan were formulated together and I agree with the documentation in the resident's note.   We collaborated with Dr Lucianne Lei Dam's office as 1 of their employees comes weekly to set up his pillbox.  We also discussed psychiatry referral with Dr. Drucilla Schmidt who agreed with referral.

## 2019-08-01 NOTE — Telephone Encounter (Signed)
Spoke with Collie Siad to [employer] with be coming over today to help manage medications. Grandmother appreciative of phone call.  Eugenia Mcalpine, LPN

## 2019-08-01 NOTE — Progress Notes (Signed)
Focus of today's visit: Medication management, pillbox refill  Current Social Determinants or Barriers to care: knowledge deficit  Home visit made today with Justin Ball and his grandmother Justin Ball today. Justin Ball states several medications are at the pharmacy for pick up but she only picked up the new medication (Spirionlactone). Ms. Justin Ball asked that I go through the medications that are in the home to consolidate and confirm which medications are actually needed.  It appears that Justin Ball has several medications on hand without the need for refills. Justin Ball has 3 to 4 days of valtrax remaining so a request for that refill has been submitted to [place] for the medications to be mailed to the home. Instructed Justin Ball and Justin Ball that the most important thing is for Justin Ball to take his medications as ordered everyday. Instructed Justin Ball to please set a reminder on his phone to remind him to take his medications. Ms. Justin Ball stated that would be a great idea and is something they can do. Ms. Justin Ball stated she does not want a lot of medications piling up at the home. I reiterated to Justin Ball that we can help with that by making sure we communicate with the pharmacy what is needed and by Justin Ball taking his medications as ordered each day. At this time a AM/PM twice a day pillbox was filled for 1 week with a back-up weekly pillbox filled as well.    Pillbox filled with:  SYMTUZA 800-150-200-10 MG Take 1 tablet by mouth daily with breakfast.     Dolutegravir Sodium (Tab) TIVICAY 50 MG Take 1 tablet (50 mg total) by mouth daily.  Lactulose (Solution) CHRONULAC 10 GM/15ML Take 45 mLs (30 g total) by mouth 3 (three) times daily.    levETIRAcetam (Tab) KEPPRA 1000 MG Take 1 tablet (1,000 mg total) by mouth 2 (two) times daily.  QUEtiapine Fumarate (Tab) SEROQUEL 25 MG Take 1 tablet (25 mg total) by mouth 2 (two) times daily as needed (agitation).    rifAXIMin (Tab) XIFAXAN 550 MG Take 1 tablet (550 mg total) by mouth 2 (two) times  daily.    Spironolactone (Tab) ALDACTONE 50 MG Take 1 tablet (50 mg total) by mouth 2 (two) times daily.    Sulfamethoxazole-Trimethoprim (Tab) BACTRIM 400-80 MG Take 1 tablet by mouth daily.    valACYclovir HCl (Tab) VALTREX 1000 MG Take 1 tablet (1,000 mg total) by mouth every Monday, Wednesday, and Friday.    .       Patient does not have magnesium oxide on hand, contacted Ms. Justin Ball and asked if I could return to the home and bring magnesium oxide. [name] agreed. Traveled to [facility] purchased magnesium oxide, traveled back to [name]'s home and updated his pillbox to include the Magnesium oxide.

## 2019-08-01 NOTE — Telephone Encounter (Signed)
Call made to Fahim's grandmother with visit scheduled for today for medication management. Ms. Collie Siad stated Wray Kearns received more medications and he has not completed his old medications. I questioned Medhansh's adherence and Ms Collie Siad stated we can discuss it during our visit today.

## 2019-08-04 ENCOUNTER — Encounter: Payer: Medicaid Other | Admitting: Internal Medicine

## 2019-08-06 ENCOUNTER — Other Ambulatory Visit: Payer: Self-pay | Admitting: Internal Medicine

## 2019-08-06 DIAGNOSIS — F319 Bipolar disorder, unspecified: Secondary | ICD-10-CM

## 2019-08-07 ENCOUNTER — Telehealth: Payer: Self-pay | Admitting: *Deleted

## 2019-08-07 NOTE — Telephone Encounter (Signed)
Spoke with [name] today. She stated Justin Ball is doing well and has another week of medications in his pill box. (last week I filled his pillbox for 2 weeks). Ms. Justin Ball asked that I give her a call next week for another pillbox refill.

## 2019-08-21 ENCOUNTER — Other Ambulatory Visit: Payer: Self-pay

## 2019-08-21 ENCOUNTER — Ambulatory Visit (HOSPITAL_COMMUNITY)
Admission: RE | Admit: 2019-08-21 | Discharge: 2019-08-21 | Disposition: A | Discharge status: Home / Self Care | Payer: Medicaid Other | Source: Ambulatory Visit | Attending: Internal Medicine | Admitting: Internal Medicine

## 2019-08-21 ENCOUNTER — Encounter: Payer: Self-pay | Admitting: Internal Medicine

## 2019-08-21 ENCOUNTER — Ambulatory Visit (INDEPENDENT_AMBULATORY_CARE_PROVIDER_SITE_OTHER): Payer: Medicaid Other | Admitting: Internal Medicine

## 2019-08-21 VITALS — BP 126/76 | HR 87 | Temp 98.3°F | Wt 186.7 lb

## 2019-08-21 DIAGNOSIS — Z21 Asymptomatic human immunodeficiency virus [HIV] infection status: Secondary | ICD-10-CM | POA: Diagnosis not present

## 2019-08-21 DIAGNOSIS — Z79899 Other long term (current) drug therapy: Secondary | ICD-10-CM

## 2019-08-21 DIAGNOSIS — B1910 Unspecified viral hepatitis B without hepatic coma: Secondary | ICD-10-CM | POA: Diagnosis not present

## 2019-08-21 DIAGNOSIS — R112 Nausea with vomiting, unspecified: Secondary | ICD-10-CM | POA: Diagnosis present

## 2019-08-21 DIAGNOSIS — K7460 Unspecified cirrhosis of liver: Secondary | ICD-10-CM | POA: Diagnosis present

## 2019-08-21 DIAGNOSIS — R188 Other ascites: Secondary | ICD-10-CM | POA: Insufficient documentation

## 2019-08-21 DIAGNOSIS — K7290 Hepatic failure, unspecified without coma: Secondary | ICD-10-CM | POA: Diagnosis not present

## 2019-08-21 DIAGNOSIS — Z7289 Other problems related to lifestyle: Secondary | ICD-10-CM | POA: Diagnosis not present

## 2019-08-21 DIAGNOSIS — B1920 Unspecified viral hepatitis C without hepatic coma: Secondary | ICD-10-CM

## 2019-08-21 MED ORDER — ONDANSETRON HCL 4 MG PO TABS: 4.0000 mg | ORAL_TABLET | Freq: Four times a day (QID) | ORAL | 3 refills | Status: AC | PRN

## 2019-08-21 MED FILL — ONDANSETRON HCL 4 MG TABLET: 4 | 7 days supply | Qty: 30 | Fill #0

## 2019-08-21 NOTE — Assessment & Plan Note (Addendum)
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  He also has a history of HIV and a recent hospital admission for hepatic encephalopathy.  He is accompanied by his grandmother who is her caretaker.  She reports that Justin Ball has been experiencing nausea and vomiting for the past 2 days and has been unable to keep down solid foods.  She has tried giving him water, ginger ale and Pepto-Bismol.  The patient denies any recent dietary changes, hematemesis, diarrhea, abdominal pain, fevers or headache.  Interestingly, he reports of continual use of alcohol which he is able to keep down without any vomiting.  He does follow-up with gastroenterology and infectious disease.  He endorses compliance to all his medications.  On physical exams, abdominal exam is completely benign.  He has noticeable scleral icterus and nonpitting lower extremity edema which has been unchanged since his last visit to [facility].  He is alert and oriented x3.  Orthostatic vitals were unremarkable.  Assessment: Etiology of his ongoing nausea and vomiting is unclear however given no reports of hematemesis, it is reassuring of no variceal bleed.  He does not report of headache nor does he appear confused making an intracranial process very less likely even though his CD4 count is 110.  His continue use of alcohol could be contributing to his ongoing emesis.  He did complain of dizziness as a sequelae of the vomiting however orthostatics were unremarkable. This could also represent a sequelae of his decompensated liver failure.  Plan: - Advised to completely abstain from EtOH - Refill for Zofran (EKG performed in clinic showed QTC of 462) - Advised to follow-up with gastroenterology  - Advised to start clear liquid diet and advance as tolerated - Started discussion with grandmother regarding the prognosis of his son if proper measures are  not taking  ADDENDUM: EKG shows some some non-specific T wave abnormalities.  I called patient's grandmother to follow-up and she reports resolution of nausea and vomiting.  He does not also report any chest pain or cardiac abnormalities.

## 2019-08-21 NOTE — Progress Notes (Signed)
   CC: Nausea and vomiting  HPI:  [name] is a 28 y.o. very pleasant gentleman with a complicated medical history presented for evaluation of nausea and vomiting.  Please see problem based charting for further details  Past Medical History:  Diagnosis Date  . Aggressive behavior 06/02/2019  . 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 AKI (acute kidney injury) ([place]) 06/19/2019  . Alcoholic hepatitis 1/96/2229  . Anxiety   . 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 Bipolar 1 disorder ([place])   . Chronic hepatitis C with hepatic coma ([place])   . Chronic viral hepatitis B without coma and with delta agent ([place])   . Cognitive communication deficit    working w/ therapy  . 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 Depression   . 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 Encephalopathy, hepatic ([place]) 06/26/2019  . Fecal incontinence 11/19/2017  . Foley catheter in place   . 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 History of scabies 2013  . History of syphilis   . 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 Nausea and vomiting 01/14/2019  . 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 Neurogenic bowel    wears depends  . Neuromuscular disorder ([place])   . 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 Seizure disorder [facility])    neurologist-  dr Leta Baptist ([facility])--caused by cryptococcal meningitis/ multiple infarctions--  no seizure since hospitalization May 2018  . Status epilepticus ([place])   .  URI (upper respiratory infection) 02/24/2019  . Visuospatial deficit    Review of Systems: As per HPI  Physical Exam:  Vitals:   08/21/19 1111 08/21/19 1112 08/21/19 1113 08/21/19 1115  BP: 125/71 126/72 126/77 126/76  Pulse: 83 87 86 87  Temp: 98.3 F (36.8 C)     TempSrc: Oral     SpO2: 100%     Weight: 186 lb 11.2 oz (84.7 kg)      Physical Exam  Constitutional: He is oriented to person, place, and time.  HENT:  Head: Normocephalic and atraumatic.  Eyes: Scleral icterus is present.  Neck: Neck supple.  Cardiovascular: Normal rate, regular rhythm and normal heart sounds.  No murmur heard. Pulmonary/Chest: Effort normal and breath sounds normal. He has no wheezes. He has no rales.  Abdominal: Soft. Bowel sounds are normal. There is no abdominal tenderness. There is no rebound.  Musculoskeletal:        General: Edema (Non pitting) present.  Neurological: He is alert and oriented to person, place, and time. Gait normal.    Assessment & Plan:   See Encounters Tab for problem based charting.  Patient discussed with Dr. Dareen Piano

## 2019-08-21 NOTE — Patient Instructions (Signed)
Justin Ball,   It was a pleasure taking care of you in the clinic today.  Regarding your vomiting, I would encourage that you start eating clear liquid diet and if you are able to tolerate that you can slowly advance diet.  I will strongly encourage you to stop drinking alcohol.  I will give you some nausea medication short-term.  I would also encourage you to call the GI doctor for follow-up.  Take Care! Dr. Eileen Stanford  Please call the internal medicine center clinic if you have any questions or concerns, we may be able to help and keep you from a long and expensive emergency room wait. Our clinic and after hours phone number is [phone], the best time to call is Monday through Friday 9 am to 4 pm but there is always someone available 24/7 if you have an emergency. If you need medication refills please notify your pharmacy one week in advance and they will send [place] a request.

## 2019-08-22 ENCOUNTER — Ambulatory Visit: Payer: Self-pay | Admitting: *Deleted

## 2019-08-22 ENCOUNTER — Telehealth: Payer: Self-pay | Admitting: *Deleted

## 2019-08-22 DIAGNOSIS — Z9114 Patient's other noncompliance with medication regimen: Secondary | ICD-10-CM

## 2019-08-22 DIAGNOSIS — B20 Human immunodeficiency virus [HIV] disease: Secondary | ICD-10-CM

## 2019-08-22 NOTE — Telephone Encounter (Signed)
Call received from [facility]) who stated she is really concerned for Justin Ball. She stated Justin Ball had an appt yesterday and Ms. Justin Ball made her aware that Justin Ball is still drinking and if he does not stop it could kill him. She would like assistance with locating a substance abuse program for him because she feels he is depressed by the deterioration of his health. I agreed with Justin Ball. Justin Ball stated Ms. Justin Ball cannot read or write and its difficult for her to manage her own help so caring for Justin Ball is a struggle. Justin Ball would like for me to call her whenever I need to get in contact with Justin Ball. Justin Ball works Mon-Fri so that is the reason why Ms. Justin Ball is typically present not Justin Ball.  Call placed to [facility], spoke with the lead counselor about Justin Ball. The counselor stated at this time they are not accepting in-office clients unless the concern is opioid abuse.  His suggestion is to go over to [facility] for behavioral evaluation/alcohol detox. After detox, Justin Ball can be connected with Justin Ball who has a provider on staff to manage Justin Ball's complex health concerns.   [place] to question the referral process. Staff member stated Justin Ball will have to do a walk in Mon-Fri from 8:30-12 or 1-2:30.  Called Justin Ball back and questioned more about Justin Ball's drinking (quantity and frequency)). She stated Justin Ball is using his debit card walking to the local gas station and purchases about 1 to 3, 24oz himself. He returns home and puts the alcohol on the outside of his bedroom window seal and then walks back into the home so his grandmother does not see the alcohol that he purchased. He is not binge drinking or drinking daily but he is becoming sick due to his drinking. Advised Justin Ball that [place] could help Justin Ball with his current drinking, past traumas and depression. Advised her that Justin Ball will  have to do a walk-in for scheduling. Justin Ball has already spoke with Justin Ball and we will all meet today at 4:30 to refill Justin Ball's pillbox. I have also printed the brochure for Justin Ball.

## 2019-08-24 NOTE — Progress Notes (Signed)
Internal Medicine Clinic Attending  Case discussed with Dr. Agyei at the time of the visit.  We reviewed the resident's history and exam and pertinent patient test results.  I agree with the assessment, diagnosis, and plan of care documented in the resident's note.    

## 2019-08-25 NOTE — Addendum Note (Signed)
Addended by: Jean Rosenthal on: 08/25/2019 08:29 AM   Modules accepted: Level of Service

## 2019-08-28 ENCOUNTER — Other Ambulatory Visit: Payer: Self-pay

## 2019-09-08 ENCOUNTER — Other Ambulatory Visit: Payer: Self-pay

## 2019-09-08 ENCOUNTER — Encounter: Payer: Self-pay | Admitting: Internal Medicine

## 2019-09-08 ENCOUNTER — Ambulatory Visit (INDEPENDENT_AMBULATORY_CARE_PROVIDER_SITE_OTHER): Payer: Medicaid Other | Admitting: Internal Medicine

## 2019-09-08 VITALS — BP 110/63 | HR 96 | Temp 98.4°F | Ht 73.0 in | Wt 206.2 lb

## 2019-09-08 DIAGNOSIS — B182 Chronic viral hepatitis C: Secondary | ICD-10-CM | POA: Diagnosis not present

## 2019-09-08 DIAGNOSIS — R188 Other ascites: Secondary | ICD-10-CM

## 2019-09-08 DIAGNOSIS — F1010 Alcohol abuse, uncomplicated: Secondary | ICD-10-CM | POA: Diagnosis not present

## 2019-09-08 DIAGNOSIS — Z79899 Other long term (current) drug therapy: Secondary | ICD-10-CM | POA: Diagnosis not present

## 2019-09-08 DIAGNOSIS — E8809 Other disorders of plasma-protein metabolism, not elsewhere classified: Secondary | ICD-10-CM

## 2019-09-08 DIAGNOSIS — B181 Chronic viral hepatitis B without delta-agent: Secondary | ICD-10-CM | POA: Diagnosis not present

## 2019-09-08 DIAGNOSIS — K7460 Unspecified cirrhosis of liver: Secondary | ICD-10-CM

## 2019-09-08 DIAGNOSIS — K7010 Alcoholic hepatitis without ascites: Secondary | ICD-10-CM | POA: Diagnosis not present

## 2019-09-08 DIAGNOSIS — K7291 Hepatic failure, unspecified with coma: Secondary | ICD-10-CM

## 2019-09-08 DIAGNOSIS — I509 Heart failure, unspecified: Secondary | ICD-10-CM | POA: Diagnosis not present

## 2019-09-08 NOTE — Progress Notes (Signed)
CC: Leg Swelling  HPI:  Justin Ball is a 28 y.o. male, with PMH of Acute CHF, HCV, HBV, alcohol abuse, and Liver cirrhosis, who presents to the clinic with leg swelling.   Please see Assessment and Plan for problem based charting and chronic medical planning.   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   Bipolar 1 disorder (HCC)    Chronic hepatitis C with hepatic coma (HCC)    Chronic viral hepatitis B without coma and with delta agent (HCC)    Cognitive communication deficit    working w/ therapy   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 and  near heriation of brain (required VP shunt)--- residual CNS damage   Depression    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)    sores   Encephalopathy, hepatic (HCC) 06/26/2019   Fecal incontinence 11/19/2017   Foley catheter in place    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   cerebral infection   History of cerebral infarction 05/20/2017   multiple ischemic infarction secondary to infection   History of scabies 2013   History of syphilis    Memory deficit after cerebral infarction    due to infection   Nausea and vomiting 01/14/2019   Neurogenic bladder    secondary to damage from brain infection   Neurogenic bowel    wears depends   Neuromuscular disorder (HCC)    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   Physical Exam:  Vitals:   09/08/19 1520  BP: 110/63  Pulse: 96  Temp: 98.4 F (36.9 C)  TempSrc: Oral  SpO2: 100%  Weight: 206 lb 3.2 oz (93.5 kg)  Height: 6\' 1"  (1.854 m)   Physical Exam Constitutional:      Appearance: Normal appearance.  HENT:     Head: Normocephalic and atraumatic.  Eyes:     General: Scleral icterus present.        Right eye: No discharge.        Left eye: No discharge.     Extraocular Movements: Extraocular movements intact.     Pupils: Pupils are equal, round, and reactive to light.  Cardiovascular:     Rate and Rhythm: Normal rate and regular rhythm.     Pulses: Normal pulses.     Heart sounds: Normal heart sounds. No murmur. No gallop.   Pulmonary:     Effort: Pulmonary effort is normal. No respiratory distress.     Breath sounds: Normal breath sounds. No wheezing, rhonchi or rales.  Abdominal:     General: Abdomen is flat. Bowel sounds are normal. There is no distension.     Palpations: Abdomen is soft.     Tenderness: There is no abdominal tenderness. There is no guarding.  Musculoskeletal:  General: Swelling and tenderness present.     Right lower leg: Edema present.     Left lower leg: Edema present.     Comments: +3 pitting edema in the LE bilaterally. Recoils to palpation over edema.   Skin:    Comments: Multiple healed ulcerations noted in the LE bilaterally.   Neurological:     General: No focal deficit present.     Mental Status: He is alert and oriented to person, place, and time. Mental status is at baseline.     Cranial Nerves: No cranial nerve deficit.     Motor: No weakness.     Comments: CN II-XII intact bilaterally     Assessment & Plan:   See Encounters Tab for problem based charting.  Patient seen with Dr. Angelia Mould

## 2019-09-08 NOTE — Assessment & Plan Note (Signed)
Assessment:  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 After undergoing chart review, it was seen that they had a telehealth visit with Dr. Carlean Purl, but have not had an outpatient follow up.   Plan:  - Given Dr. Celesta Aver office number in order to make a follow up appointment.

## 2019-09-08 NOTE — Assessment & Plan Note (Signed)
Assessment:  Justin Ball is a 28 y/o male who presents with bilateral lower extremity edema 2/2 to decompensated liver cirrhosis.  Today we spoke about Justin Ball's causes for his bilateral lower extremity edema. This includes his well managed HBV infection, his untreated HCV infection, and his alcohol use. We discussed his hypoalbuminemia 2/2 to liver cirrhosis and how his current medical regime is meant to optimize fluid output.  Plan:  - Patient education on alcohol use and the need for alcohol cessation.  - Increase Lasix to 40 mg QD - Continue Aldactone as indicated.  

## 2019-09-08 NOTE — Assessment & Plan Note (Addendum)
Assessment:  Today we discussed Mr. Kehres's alcohol use. Per his grandmother, Collie Siad, for the past month, Mr. Greenough has cut back on his drinking. He has gone from drinking 2-3 bottles of wine or beer to 1 bottle of wine a week. They have also set up an appointment in October to meet with [employer] to have counseling for Mr. Bagnall's alcohol use.   Plan:  - Patient education on alcohol use and how it can lead to liver impairment/failure.  - Patient encouraged to quit drinking alcohol.  - Follow up during the next appointment on drinking status.

## 2019-09-08 NOTE — Assessment & Plan Note (Deleted)
Assessment:  [name] is a 28 y/o male who presents with bilateral lower extremity edema 2/2 to decompensated liver cirrhosis.  Today we spoke about [name] causes for his bilateral lower extremity edema. This includes his well managed HBV infection, his untreated HCV infection, and his alcohol use. We discussed his hypoalbuminemia 2/2 to liver cirrhosis and how his current medical regime is meant to optimize fluid output.  Plan:  - Patient education on alcohol use and the need for alcohol cessation.  - Increase Lasix to 40 mg QD - Continue Aldactone as indicated.

## 2019-09-08 NOTE — Patient Instructions (Addendum)
It was a pleasure meeting you today. I would like to give an abbreviated account of our meeting.   1. Leg swelling: We spoke about the main causes of your leg swelling today. The three factors that are causing it is your alcohol use, hepatitis B, and hepatitis C infection infection which have caused your liver to fail. We discussed stopping all alcohol in order to help recover your liver. We have asked you to increase your lasix from 20 mg one time a day, to 40 mg one time daily. Please take 2 of the 20 mg tablets.   Please make an appointment with Dr. Carlean Purl for evaluation of your hepatitis C infection.   Please come back to be evaluated if you notice shortness of breath, fever, frequent headaches, or any immediate changes in your condition. I look forward to seeing you again in 1-2 months.  Alcoholic Liver Disease  Alcoholic liver disease is liver damage that is caused by drinking a lot of alcohol for a long time. If you have this disease, you must stop drinking alcohol. Follow these instructions at home:   Do not drink alcohol. Follow your treatment plan. Work with your doctor if you need help.  Think about joining an alcohol support group.  Take over-the-counter and prescription medicines only as told by your doctor. These include vitamins.  Do not use medicines or eat foods that have alcohol in them, unless your doctor says that it is safe.  Follow instructions from your doctor about eating a healthy diet.  Keep all follow-up visits as told by your doctor. This is important. Contact a doctor if:  You get a fever.  Your skin starts to look more yellow, pale, or dark.  You get headaches. Get help right away if:  You throw up (vomit) blood.  You have bright red blood in your poop (stool).  Your poop looks black or looks like tar.  You have trouble: ? Thinking. ? Walking. ? Balancing. ? Breathing. Summary  Alcoholic liver disease is liver damage that is caused by  drinking a lot of alcohol for a long time.  If you have this disease, you must stop drinking alcohol. Follow your treatment plan, and work with your doctor as needed.  Think about joining an alcohol support group. This information is not intended to replace advice given to you by your health care provider. Make sure you discuss any questions you have with your health care provider. Document Released: 10/08/2009 Document Revised: 04/01/2019 Document Reviewed: 08/31/2017 Elsevier Patient Education  2020 [employer].

## 2019-09-09 ENCOUNTER — Telehealth: Payer: Self-pay | Admitting: *Deleted

## 2019-09-09 LAB — CMP14 + ANION GAP
ALT: 60 IU/L — ABNORMAL HIGH (ref 0–44)
AST: 161 IU/L — ABNORMAL HIGH (ref 0–40)
Albumin/Globulin Ratio: 0.4 — ABNORMAL LOW (ref 1.2–2.2)
Albumin: 2.5 g/dL — ABNORMAL LOW (ref 4.1–5.2)
Alkaline Phosphatase: 241 IU/L — ABNORMAL HIGH (ref 39–117)
Anion Gap: 15 mmol/L (ref 10.0–18.0)
BUN/Creatinine Ratio: 6 — ABNORMAL LOW (ref 9–20)
BUN: 7 mg/dL (ref 6–20)
Bilirubin Total: 6.5 mg/dL — ABNORMAL HIGH (ref 0.0–1.2)
CO2: 16 mmol/L — ABNORMAL LOW (ref 20–29)
Calcium: 8.2 mg/dL — ABNORMAL LOW (ref 8.7–10.2)
Chloride: 107 mmol/L — ABNORMAL HIGH (ref 96–106)
Creatinine, Ser: 1.18 mg/dL (ref 0.76–1.27)
GFR calc Af Amer: 97 mL/min/{1.73_m2} (ref >59)
GFR calc non Af Amer: 83 mL/min/{1.73_m2} (ref >59)
Globulin, Total: 6.3 g/dL — ABNORMAL HIGH (ref 1.5–4.5)
Glucose: 82 mg/dL (ref 65–99)
Potassium: 3.9 mmol/L (ref 3.5–5.2)
Sodium: 138 mmol/L (ref 134–144)
Total Protein: 8.8 g/dL — ABNORMAL HIGH (ref 6.0–8.5)

## 2019-09-09 LAB — CBC WITH DIFFERENTIAL/PLATELET
Basophils Absolute: 0.1 10*3/uL (ref 0.0–0.2)
Basos: 2 %
EOS (ABSOLUTE): 0.1 10*3/uL (ref 0.0–0.4)
Eos: 5 %
Hematocrit: 27.7 % — ABNORMAL LOW (ref 37.5–51.0)
Hemoglobin: 10.5 g/dL — ABNORMAL LOW (ref 13.0–17.7)
Immature Grans (Abs): 0 10*3/uL (ref 0.0–0.1)
Immature Granulocytes: 0 %
Lymphocytes Absolute: 1.6 10*3/uL (ref 0.7–3.1)
Lymphs: 49 %
MCH: 32.9 pg (ref 26.6–33.0)
MCHC: 37.9 g/dL — ABNORMAL HIGH (ref 31.5–35.7)
MCV: 87 fL (ref 79–97)
Monocytes Absolute: 0.3 10*3/uL (ref 0.1–0.9)
Monocytes: 9 %
Neutrophils Absolute: 1.2 10*3/uL — ABNORMAL LOW (ref 1.4–7.0)
Neutrophils: 35 %
Platelets: 83 10*3/uL — CL (ref 150–450)
RBC: 3.19 x10E6/uL — ABNORMAL LOW (ref 4.14–5.80)
RDW: 13.2 % (ref 11.6–15.4)
WBC: 3.2 10*3/uL — ABNORMAL LOW (ref 3.4–10.8)

## 2019-09-09 LAB — MAGNESIUM: Magnesium: 1.5 mg/dL — ABNORMAL LOW (ref 1.6–2.3)

## 2019-09-09 NOTE — Telephone Encounter (Signed)
Yesterday's labs(critical) reviewed by Dr Philipp Ovens, attending for this am - stated pt needs to seen and re-evaluated. Called pt's grandmother, Ms Collie Siad - informed pt needs to be seen again d/t lab results which will be discussed at visit. Stated they can come tomorrow (has her grandchildren today) - [facility] appt scheduled @ 1045 AM tomorrow.

## 2019-09-10 ENCOUNTER — Ambulatory Visit (INDEPENDENT_AMBULATORY_CARE_PROVIDER_SITE_OTHER): Payer: Medicaid Other | Admitting: Internal Medicine

## 2019-09-10 ENCOUNTER — Encounter: Payer: Self-pay | Admitting: Internal Medicine

## 2019-09-10 VITALS — BP 110/79 | HR 91 | Temp 98.8°F | Wt 203.1 lb

## 2019-09-10 DIAGNOSIS — K7210 Chronic hepatic failure without coma: Secondary | ICD-10-CM | POA: Diagnosis not present

## 2019-09-10 DIAGNOSIS — Z79899 Other long term (current) drug therapy: Secondary | ICD-10-CM | POA: Diagnosis not present

## 2019-09-10 DIAGNOSIS — F17210 Nicotine dependence, cigarettes, uncomplicated: Secondary | ICD-10-CM

## 2019-09-10 NOTE — Patient Instructions (Addendum)
Justin Ball keep up the good work on stopping your alcohol usage.  We went over your lab work today and have fitted you for compression stockings.  Please follow up with your pcp in about 3-4 weeks.  I have listed Dr. Celesta Aver information below so you can call and make a follow up appointment with him in the next 1-2 weeks.      [facility] Address: [address], [place], [place] [address] Phone: [phone]

## 2019-09-10 NOTE — Progress Notes (Signed)
Internal Medicine Clinic Attending  I saw and evaluated the patient.  I personally confirmed the key portions of the history and exam documented by Dr. Winters and I reviewed pertinent patient test results.  The assessment, diagnosis, and plan were formulated together and I agree with the documentation in the resident's note.  

## 2019-09-10 NOTE — Assessment & Plan Note (Addendum)
Patient here today to follow-up on lab work.  Hepatic function appears to be stable today.  Stopped drinking alcohol for about two weeks.  Urinating more frequently on the increased dose of lasix pcp increased to 40mg .  Hasn't been long enough to tell if he's had improvement in fluid removal. Has not made a follow up appointment with Dr. Carlean Purl was not given contact information.    -continue current regimen -fit the patient for compression stockings to help with LE edema -gave GI physician contact info

## 2019-09-10 NOTE — Progress Notes (Signed)
CC: chronic liver failure  HPI:  Justin Ball is a 28 y.o. male with PMH below.  Today we will address chronic liver failure  Please see A&P for status of the patient's chronic medical conditions  Past Medical History:  Diagnosis Date  . Aggressive behavior 06/02/2019  . 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 AKI (acute kidney injury) (HCC) 06/19/2019  . Alcoholic hepatitis 01/14/2019  . Anxiety   . 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 Bipolar 1 disorder (HCC)   . Chronic hepatitis C with hepatic coma (HCC)   . 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 Cognitive communication deficit    working w/ therapy  . 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 Depression   . 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 Encephalopathy, hepatic (HCC) 06/26/2019  . Fecal incontinence 11/19/2017  . Foley catheter in place   . 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 History of scabies 2013  . History of syphilis   . 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 Nausea and vomiting 01/14/2019  . 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 Neurogenic bowel    wears depends  . Neuromuscular disorder (HCC)   . 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 Seizure disorder [facility](HCC)    neurologist-  dr Marjory Liespenumalli ([facility])--caused by cryptococcal meningitis/ multiple infarctions--  no seizure since hospitalization May 2018  . Status epilepticus (HCC)   . URI (upper respiratory  infection) 02/24/2019  . Visuospatial deficit    Review of Systems:  ROS: Pulmonary: pt denies increased work of breathing, shortness of breath,  Cardiac: pt denies palpitations, chest pain,  Abdominal: pt denies abdominal pain, nausea, vomiting, or diarrhea   Physical Exam:  Vitals:   09/10/19 1138  BP: 110/79  Pulse: 91  Temp: 98.8 F (37.1 C)  TempSrc: Oral  SpO2: 100%  Weight: 203 lb 1.6 oz (92.1 kg)   Cardiac: JVD flat, normal rate and rhythm, clear s1 and s2, no murmurs, rubs or gallops, 2-3+ LE edema Pulmonary: CTAB, not in distress Abdominal: non distended abdomen, soft and nontender Psych: Alert, conversant, in good spirits   Social History   Socioeconomic History  . Marital status: Single    Spouse name: Not on file  . Number of children: Not on file  . Years of education: Not on file  . Highest education level: Not on file  Occupational History  . Not on file  Social Needs  . Financial resource strain: Not on file  . Food insecurity    Worry: Not on file    Inability: Not on file  . Transportation needs    Medical: Not on file    Non-medical: Not on file  Tobacco Use  . Smoking status: Current Every Day Smoker    Packs/day: 0.40    Years: 5.00    Pack years: 2.00    Types: Cigarettes  . Smokeless tobacco: Never Used  Substance and Sexual Activity  .  Alcohol use: Yes  . Drug use: No  . Sexual activity: Yes    Partners: Male    Birth control/protection: Condom    Comment: irregular condom use; educated  Lifestyle  . Physical activity    Days per week: Not on file    Minutes per session: Not on file  . Stress: Not on file  Relationships  . Social Herbalist on phone: Not on file    Gets together: Not on file    Attends religious service: Not on file    Active member of club or organization: Not on file    Attends meetings of clubs or organizations: Not on file    Relationship status: Not on file  . Intimate partner violence     Fear of current or ex partner: Not on file    Emotionally abused: Not on file    Physically abused: Not on file    Forced sexual activity: Not on file  Other Topics Concern  . Not on file  Social History Narrative   He is he is single, no children listed, he is unemployed and is on [place] and Medicare.   Lives with his grandmother.   Male sexual partners   Alcohol use off and on   Cigarette smoker, not a vapor, no other drug use reported       Family History  Problem Relation Age of Onset  . Hypertension Other     Assessment & Plan:   See Encounters Tab for problem based charting.  Patient discussed with Dr. Lynnae January

## 2019-09-15 NOTE — Progress Notes (Signed)
[facility] Clinic Attending  Case discussed with Dr. Winfrey  at the time of the visit.  We reviewed the resident's history and exam and pertinent patient test results.  I agree with the assessment, diagnosis, and plan of care documented in the resident's note.  

## 2019-09-18 MED FILL — SYMTUZA 800-150-200-10 MG T: 800-150-200 | 30 days supply | Qty: 30 | Fill #2

## 2019-09-18 MED FILL — TIVICAY 50 MG TABLET: 50 | 30 days supply | Qty: 30 | Fill #2

## 2019-10-09 ENCOUNTER — Ambulatory Visit: Payer: Medicaid Other

## 2019-10-16 ENCOUNTER — Ambulatory Visit: Payer: Medicaid Other

## 2019-10-16 MED FILL — TIVICAY 50 MG TABLET: 50 | 30 days supply | Qty: 30 | Fill #3

## 2019-10-16 MED FILL — SYMTUZA 800-150-200-10 MG T: 800-150-200 | 30 days supply | Qty: 30 | Fill #3

## 2019-11-18 MED FILL — TIVICAY 50 MG TABLET: 50 | 30 days supply | Qty: 30 | Fill #4

## 2019-11-18 MED FILL — SYMTUZA 800-150-200-10 MG T: 800-150-200 | 30 days supply | Qty: 30 | Fill #4

## 2019-12-03 ENCOUNTER — Encounter: Payer: Self-pay | Admitting: Infectious Disease

## 2019-12-03 NOTE — Progress Notes (Signed)
Patient ID: Justin Ball, male   DOB: [birth year], 28 y.o.   MRN: [id number] Called patient to schedule left message to call for appointment

## 2019-12-05 ENCOUNTER — Other Ambulatory Visit: Payer: Self-pay

## 2019-12-05 DIAGNOSIS — B20 Human immunodeficiency virus [HIV] disease: Secondary | ICD-10-CM

## 2019-12-05 DIAGNOSIS — Z113 Encounter for screening for infections with a predominantly sexual mode of transmission: Secondary | ICD-10-CM

## 2019-12-08 ENCOUNTER — Other Ambulatory Visit: Payer: Medicare Other

## 2019-12-16 MED FILL — TIVICAY 50 MG TABLET: 50 | 30 days supply | Qty: 30 | Fill #5

## 2019-12-16 MED FILL — SYMTUZA 800-150-200-10 MG T: 800-150-200 | 30 days supply | Qty: 30 | Fill #5

## 2019-12-20 ENCOUNTER — Other Ambulatory Visit: Payer: Self-pay

## 2019-12-20 ENCOUNTER — Encounter (HOSPITAL_COMMUNITY): Payer: Self-pay | Admitting: Emergency Medicine

## 2019-12-20 ENCOUNTER — Emergency Department (HOSPITAL_COMMUNITY): Payer: Medicare Other

## 2019-12-20 ENCOUNTER — Inpatient Hospital Stay (HOSPITAL_COMMUNITY)
Admission: EM | Admit: 2019-12-20 | Discharge: 2019-12-26 | DRG: 097 | Disposition: E | Payer: Medicare Other | Attending: Internal Medicine | Admitting: Internal Medicine

## 2019-12-20 ENCOUNTER — Ambulatory Visit (HOSPITAL_COMMUNITY)
Admission: EM | Admit: 2019-12-20 | Discharge: 2019-12-20 | Disposition: A | Discharge status: Other Acute Inpatient Hospital | Payer: Medicare Other | Source: Home / Self Care

## 2019-12-20 DIAGNOSIS — R569 Unspecified convulsions: Secondary | ICD-10-CM | POA: Diagnosis not present

## 2019-12-20 DIAGNOSIS — J9601 Acute respiratory failure with hypoxia: Secondary | ICD-10-CM | POA: Diagnosis not present

## 2019-12-20 DIAGNOSIS — G039 Meningitis, unspecified: Secondary | ICD-10-CM

## 2019-12-20 DIAGNOSIS — K7010 Alcoholic hepatitis without ascites: Secondary | ICD-10-CM | POA: Diagnosis present

## 2019-12-20 DIAGNOSIS — R34 Anuria and oliguria: Secondary | ICD-10-CM | POA: Diagnosis present

## 2019-12-20 DIAGNOSIS — R579 Shock, unspecified: Secondary | ICD-10-CM | POA: Diagnosis not present

## 2019-12-20 DIAGNOSIS — Z8661 Personal history of infections of the central nervous system: Secondary | ICD-10-CM

## 2019-12-20 DIAGNOSIS — F1010 Alcohol abuse, uncomplicated: Secondary | ICD-10-CM | POA: Diagnosis present

## 2019-12-20 DIAGNOSIS — R519 Headache, unspecified: Secondary | ICD-10-CM

## 2019-12-20 DIAGNOSIS — R4020 Unspecified coma: Secondary | ICD-10-CM | POA: Diagnosis not present

## 2019-12-20 DIAGNOSIS — N179 Acute kidney failure, unspecified: Secondary | ICD-10-CM | POA: Diagnosis present

## 2019-12-20 DIAGNOSIS — Z79899 Other long term (current) drug therapy: Secondary | ICD-10-CM

## 2019-12-20 DIAGNOSIS — G40901 Epilepsy, unspecified, not intractable, with status epilepticus: Secondary | ICD-10-CM | POA: Diagnosis present

## 2019-12-20 DIAGNOSIS — R159 Full incontinence of feces: Secondary | ICD-10-CM | POA: Diagnosis not present

## 2019-12-20 DIAGNOSIS — B181 Chronic viral hepatitis B without delta-agent: Secondary | ICD-10-CM | POA: Diagnosis present

## 2019-12-20 DIAGNOSIS — E876 Hypokalemia: Secondary | ICD-10-CM | POA: Diagnosis present

## 2019-12-20 DIAGNOSIS — G936 Cerebral edema: Secondary | ICD-10-CM | POA: Diagnosis present

## 2019-12-20 DIAGNOSIS — G40911 Epilepsy, unspecified, intractable, with status epilepticus: Secondary | ICD-10-CM | POA: Diagnosis present

## 2019-12-20 DIAGNOSIS — G92 Toxic encephalopathy: Secondary | ICD-10-CM | POA: Diagnosis present

## 2019-12-20 DIAGNOSIS — D689 Coagulation defect, unspecified: Secondary | ICD-10-CM | POA: Diagnosis present

## 2019-12-20 DIAGNOSIS — F0280 Dementia in other diseases classified elsewhere without behavioral disturbance: Secondary | ICD-10-CM | POA: Diagnosis present

## 2019-12-20 DIAGNOSIS — F17210 Nicotine dependence, cigarettes, uncomplicated: Secondary | ICD-10-CM | POA: Diagnosis present

## 2019-12-20 DIAGNOSIS — B182 Chronic viral hepatitis C: Secondary | ICD-10-CM | POA: Diagnosis present

## 2019-12-20 DIAGNOSIS — I6931 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: Secondary | ICD-10-CM

## 2019-12-20 DIAGNOSIS — G910 Communicating hydrocephalus: Secondary | ICD-10-CM | POA: Diagnosis present

## 2019-12-20 DIAGNOSIS — Z20828 Contact with and (suspected) exposure to other viral communicable diseases: Secondary | ICD-10-CM | POA: Diagnosis present

## 2019-12-20 DIAGNOSIS — D696 Thrombocytopenia, unspecified: Secondary | ICD-10-CM | POA: Diagnosis present

## 2019-12-20 DIAGNOSIS — R4182 Altered mental status, unspecified: Secondary | ICD-10-CM | POA: Diagnosis present

## 2019-12-20 DIAGNOSIS — I69311 Memory deficit following cerebral infarction: Secondary | ICD-10-CM

## 2019-12-20 DIAGNOSIS — Z452 Encounter for adjustment and management of vascular access device: Secondary | ICD-10-CM

## 2019-12-20 DIAGNOSIS — Z978 Presence of other specified devices: Secondary | ICD-10-CM

## 2019-12-20 DIAGNOSIS — K7030 Alcoholic cirrhosis of liver without ascites: Secondary | ICD-10-CM | POA: Diagnosis present

## 2019-12-20 DIAGNOSIS — F0150 Vascular dementia without behavioral disturbance: Secondary | ICD-10-CM | POA: Diagnosis present

## 2019-12-20 DIAGNOSIS — N319 Neuromuscular dysfunction of bladder, unspecified: Secondary | ICD-10-CM | POA: Diagnosis present

## 2019-12-20 DIAGNOSIS — F319 Bipolar disorder, unspecified: Secondary | ICD-10-CM | POA: Diagnosis present

## 2019-12-20 DIAGNOSIS — Z515 Encounter for palliative care: Secondary | ICD-10-CM

## 2019-12-20 DIAGNOSIS — Z7189 Other specified counseling: Secondary | ICD-10-CM

## 2019-12-20 DIAGNOSIS — B20 Human immunodeficiency virus [HIV] disease: Secondary | ICD-10-CM | POA: Diagnosis present

## 2019-12-20 DIAGNOSIS — Z9119 Patient's noncompliance with other medical treatment and regimen: Secondary | ICD-10-CM

## 2019-12-20 DIAGNOSIS — J22 Unspecified acute lower respiratory infection: Secondary | ICD-10-CM

## 2019-12-20 DIAGNOSIS — E872 Acidosis: Secondary | ICD-10-CM | POA: Diagnosis present

## 2019-12-20 DIAGNOSIS — Z888 Allergy status to other drugs, medicaments and biological substances status: Secondary | ICD-10-CM

## 2019-12-20 DIAGNOSIS — B451 Cerebral cryptococcosis: Principal | ICD-10-CM | POA: Diagnosis present

## 2019-12-20 DIAGNOSIS — Z66 Do not resuscitate: Secondary | ICD-10-CM | POA: Diagnosis present

## 2019-12-20 DIAGNOSIS — K7290 Hepatic failure, unspecified without coma: Secondary | ICD-10-CM | POA: Diagnosis present

## 2019-12-20 DIAGNOSIS — N182 Chronic kidney disease, stage 2 (mild): Secondary | ICD-10-CM | POA: Diagnosis present

## 2019-12-20 DIAGNOSIS — F419 Anxiety disorder, unspecified: Secondary | ICD-10-CM | POA: Diagnosis present

## 2019-12-20 DIAGNOSIS — G9340 Encephalopathy, unspecified: Secondary | ICD-10-CM

## 2019-12-20 LAB — CBC WITH DIFFERENTIAL/PLATELET
Abs Immature Granulocytes: 0.02 10*3/uL (ref 0.00–0.07)
Basophils Absolute: 0 10*3/uL (ref 0.0–0.1)
Basophils Relative: 0 %
Eosinophils Absolute: 0 10*3/uL (ref 0.0–0.5)
Eosinophils Relative: 1 %
HCT: 41 % (ref 39.0–52.0)
Hemoglobin: 14.4 g/dL (ref 13.0–17.0)
Immature Granulocytes: 0 %
Lymphocytes Relative: 27 %
Lymphs Abs: 1.3 10*3/uL (ref 0.7–4.0)
MCH: 33.2 pg (ref 26.0–34.0)
MCHC: 35.1 g/dL (ref 30.0–36.0)
MCV: 94.5 fL (ref 80.0–100.0)
Monocytes Absolute: 0.3 10*3/uL (ref 0.1–1.0)
Monocytes Relative: 6 %
Neutro Abs: 3 10*3/uL (ref 1.7–7.7)
Neutrophils Relative %: 66 %
Platelets: 90 10*3/uL — ABNORMAL LOW (ref 150–400)
RBC: 4.34 MIL/uL (ref 4.22–5.81)
RDW: 15.4 % (ref 11.5–15.5)
WBC: 4.7 10*3/uL (ref 4.0–10.5)
nRBC: 0 % (ref 0.0–0.2)

## 2019-12-20 LAB — CBG MONITORING, ED: Glucose-Capillary: 77 mg/dL (ref 70–99)

## 2019-12-20 LAB — COMPREHENSIVE METABOLIC PANEL
ALT: 60 U/L — ABNORMAL HIGH (ref 0–44)
AST: 145 U/L — ABNORMAL HIGH (ref 15–41)
Albumin: 2.3 g/dL — ABNORMAL LOW (ref 3.5–5.0)
Alkaline Phosphatase: 125 U/L (ref 38–126)
Anion gap: 9 (ref 5–15)
BUN: 10 mg/dL (ref 6–20)
CO2: 22 mmol/L (ref 22–32)
Calcium: 8.9 mg/dL (ref 8.9–10.3)
Chloride: 107 mmol/L (ref 98–111)
Creatinine, Ser: 1.64 mg/dL — ABNORMAL HIGH (ref 0.61–1.24)
GFR calc Af Amer: >60 mL/min (ref >60)
GFR calc non Af Amer: 56 mL/min — ABNORMAL LOW (ref >60)
Glucose, Bld: 83 mg/dL (ref 70–99)
Potassium: 2.9 mmol/L — ABNORMAL LOW (ref 3.5–5.1)
Sodium: 138 mmol/L (ref 135–145)
Total Bilirubin: 10.2 mg/dL — ABNORMAL HIGH (ref 0.3–1.2)
Total Protein: 11 g/dL — ABNORMAL HIGH (ref 6.5–8.1)

## 2019-12-20 LAB — HEPATIC FUNCTION PANEL
ALT: 54 U/L — ABNORMAL HIGH (ref 0–44)
AST: 148 U/L — ABNORMAL HIGH (ref 15–41)
Albumin: 2 g/dL — ABNORMAL LOW (ref 3.5–5.0)
Alkaline Phosphatase: 106 U/L (ref 38–126)
Bilirubin, Direct: 4.6 mg/dL — ABNORMAL HIGH (ref 0.0–0.2)
Indirect Bilirubin: 4.6 mg/dL — ABNORMAL HIGH (ref 0.3–0.9)
Total Bilirubin: 9.2 mg/dL — ABNORMAL HIGH (ref 0.3–1.2)
Total Protein: 9.8 g/dL — ABNORMAL HIGH (ref 6.5–8.1)

## 2019-12-20 LAB — LACTIC ACID, PLASMA
Lactic Acid, Venous: 1.1 mmol/L (ref 0.5–1.9)
Lactic Acid, Venous: 5.8 mmol/L (ref 0.5–1.9)

## 2019-12-20 LAB — LIPASE, BLOOD: Lipase: 39 U/L (ref 11–51)

## 2019-12-20 LAB — PROTIME-INR
INR: 1.5 — ABNORMAL HIGH (ref 0.8–1.2)
Prothrombin Time: 18.3 seconds — ABNORMAL HIGH (ref 11.4–15.2)

## 2019-12-20 LAB — AMMONIA: Ammonia: 154 umol/L — ABNORMAL HIGH (ref 9–35)

## 2019-12-20 MED ORDER — PHYTONADIONE 5 MG PO TABS
2.5000 mg | ORAL_TABLET | Freq: Once | ORAL | Status: DC
  Filled 2019-12-20: qty 1

## 2019-12-20 MED ORDER — LORAZEPAM 2 MG/ML IJ SOLN
INTRAMUSCULAR | Status: AC
  Administered 2019-12-20: 2 mg
  Filled 2019-12-20: qty 2

## 2019-12-20 MED ORDER — SODIUM CHLORIDE 0.9 % IV SOLN
2.0000 g | Freq: Two times a day (BID) | INTRAVENOUS | Status: DC
  Administered 2019-12-20 – 2019-12-23 (×6): 2 g via INTRAVENOUS
  Filled 2019-12-20: qty 20
  Filled 2019-12-20 (×2): qty 2
  Filled 2019-12-20 (×2): qty 20
  Filled 2019-12-20 (×2): qty 2

## 2019-12-20 MED ORDER — VANCOMYCIN HCL 1250 MG/250ML IV SOLN
1250.0000 mg | Freq: Two times a day (BID) | INTRAVENOUS | Status: DC
  Administered 2019-12-21 – 2019-12-22 (×4): 1250 mg via INTRAVENOUS
  Filled 2019-12-20 (×5): qty 250

## 2019-12-20 MED ORDER — DEXAMETHASONE SODIUM PHOSPHATE 10 MG/ML IJ SOLN
10.0000 mg | Freq: Four times a day (QID) | INTRAMUSCULAR | Status: DC
  Administered 2019-12-21 – 2019-12-23 (×11): 10 mg via INTRAVENOUS
  Filled 2019-12-20 (×13): qty 1

## 2019-12-20 MED ORDER — FLUCYTOSINE 250 MG PO CAPS
25.0000 mg/kg | ORAL_CAPSULE | Freq: Four times a day (QID) | ORAL | Status: DC
  Administered 2019-12-21 – 2019-12-22 (×7): 2250 mg via ORAL
  Filled 2019-12-20 (×11): qty 9

## 2019-12-20 MED ORDER — SODIUM CHLORIDE 0.9 % IV BOLUS
500.0000 mL | Freq: Once | INTRAVENOUS | Status: AC
  Administered 2019-12-20: 500 mL via INTRAVENOUS

## 2019-12-20 MED ORDER — DIPHENHYDRAMINE HCL 50 MG/ML IJ SOLN
25.0000 mg | Freq: Every day | INTRAMUSCULAR | Status: DC | PRN
  Administered 2019-12-22: 25 mg via INTRAVENOUS
  Filled 2019-12-20: qty 1

## 2019-12-20 MED ORDER — SODIUM CHLORIDE 0.9 % IV BOLUS FOR AMBISOME
500.0000 mL | INTRAVENOUS | Status: DC
  Administered 2019-12-21 – 2019-12-24 (×4): 500 mL via INTRAVENOUS

## 2019-12-20 MED ORDER — DIPHENHYDRAMINE HCL 25 MG PO CAPS
25.0000 mg | ORAL_CAPSULE | Freq: Every day | ORAL | Status: DC | PRN
  Administered 2019-12-22: 25 mg via ORAL
  Filled 2019-12-20: qty 1

## 2019-12-20 MED ORDER — LEVETIRACETAM IN NACL 1000 MG/100ML IV SOLN
1000.0000 mg | Freq: Once | INTRAVENOUS | Status: AC
  Administered 2019-12-20: 19:00:00 1000 mg via INTRAVENOUS
  Filled 2019-12-20: qty 100

## 2019-12-20 MED ORDER — SODIUM CHLORIDE 0.9 % IV SOLN
2.0000 g | INTRAVENOUS | Status: DC
  Administered 2019-12-21 – 2019-12-23 (×16): 2 g via INTRAVENOUS
  Filled 2019-12-20 (×2): qty 2000
  Filled 2019-12-20 (×2): qty 2
  Filled 2019-12-20 (×2): qty 2000
  Filled 2019-12-20: qty 2
  Filled 2019-12-20 (×5): qty 2000
  Filled 2019-12-20 (×6): qty 2
  Filled 2019-12-20 (×2): qty 2000
  Filled 2019-12-20: qty 2

## 2019-12-20 MED ORDER — FLUCYTOSINE 500 MG PO CAPS
25.0000 mg/kg | ORAL_CAPSULE | Freq: Four times a day (QID) | ORAL | Status: DC
  Filled 2019-12-20 (×2): qty 1

## 2019-12-20 MED ORDER — DEXTROSE 5 % IV BOLUS
10.0000 mL | INTRAVENOUS | Status: DC
  Administered 2019-12-21 – 2019-12-25 (×5): 10 mL via INTRAVENOUS
  Filled 2019-12-20: qty 50

## 2019-12-20 MED ORDER — VANCOMYCIN HCL 2000 MG/400ML IV SOLN
2000.0000 mg | Freq: Once | INTRAVENOUS | Status: AC
  Administered 2019-12-21: 2000 mg via INTRAVENOUS
  Filled 2019-12-20 (×2): qty 400

## 2019-12-20 MED ORDER — DEXTROSE 5 % IV BOLUS
10.0000 mL | INTRAVENOUS | Status: DC
  Administered 2019-12-21 – 2019-12-25 (×4): 10 mL via INTRAVENOUS
  Filled 2019-12-20: qty 50

## 2019-12-20 MED ORDER — MEPERIDINE HCL 25 MG/ML IJ SOLN: 25.0000 mg | INTRAMUSCULAR | Status: DC | PRN

## 2019-12-20 MED ORDER — POTASSIUM CHLORIDE 10 MEQ/100ML IV SOLN
10.0000 meq | INTRAVENOUS | Status: AC
  Administered 2019-12-21 (×4): 10 meq via INTRAVENOUS
  Filled 2019-12-20 (×4): qty 100

## 2019-12-20 MED ORDER — SODIUM CHLORIDE 0.9 % IV BOLUS FOR AMBISOME
500.0000 mL | INTRAVENOUS | Status: DC
  Administered 2019-12-20 – 2019-12-22 (×3): 500 mL via INTRAVENOUS

## 2019-12-20 MED ORDER — VITAMIN K1 10 MG/ML IJ SOLN
2.5000 mg | Freq: Once | INTRAVENOUS | Status: AC
  Administered 2019-12-21: 2.5 mg via INTRAVENOUS
  Filled 2019-12-20: qty 0.25

## 2019-12-20 MED ORDER — ACETAMINOPHEN 325 MG PO TABS: 650.0000 mg | ORAL_TABLET | Freq: Every day | ORAL | Status: DC | PRN

## 2019-12-20 MED ORDER — DEXTROSE 5 % IV SOLN
3.0000 mg/kg | INTRAVENOUS | Status: DC
  Administered 2019-12-21 – 2019-12-23 (×3): 270 mg via INTRAVENOUS
  Filled 2019-12-20 (×4): qty 67.5

## 2019-12-20 NOTE — Progress Notes (Signed)
Attempted MRI, was only able to obtain diffusion images, patient kept kicking legs high up and rotating them off the table, was pushing arms up as well. Also turned head and was trying to push it again head coil. After 3rd time of doing this is under 5 minutes scan was aborted. Spoke with RN, informed her and she advised to send images through to be read. Informed her if physicians require more patient may need medication to safely be scanned

## 2019-12-20 NOTE — ED Triage Notes (Signed)
C/o headache, vomiting, and unable to sleep x 1 week.  States symptoms started after having I&D of abscess on R side of forehead.

## 2019-12-20 NOTE — ED Provider Notes (Addendum)
[facility] EMERGENCY DEPARTMENT Provider Note   CSN: [id number] Arrival date & time: 12/01/2019  1353     History Chief Complaint  Patient presents with   Headache    Justin Ball is a 28 y.o. male.  The history is provided by the patient.  Headache Pain location:  Frontal Quality:  Dull Radiates to:  Does not radiate Severity at highest:  7/10 Onset quality:  Gradual Duration:  1 week Timing:  Constant Progression:  Unchanged Chronicity:  New Similar to prior headaches: no   Context: not activity   Context comment:  Hx of HIV/AIDS and hasnt taken medications in last week. Patient also with hx of HEPB/C. 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--- dr Zenaida Niece dam   CD4=50 on 08-02-2017   AKI (acute kidney injury) (HCC) 06/19/2019   Alcoholic hepatitis 01/14/2019   Anxiety    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   Bipolar 1 disorder (HCC)    Chronic hepatitis C with hepatic coma (HCC)    Chronic viral hepatitis B without coma and with delta agent (HCC)    Cirrhosis of liver with ascites (HCC)    Cognitive communication deficit    working w/ therapy   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 and  near heriation of brain (required VP shunt)--- residual CNS damage   Depression    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)    sores    Encephalopathy, hepatic (HCC) 06/26/2019   Fecal incontinence 11/19/2017   Foley catheter in place    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   cerebral infection   History of cerebral infarction 05/20/2017   multiple ischemic infarction secondary to infection   History of scabies 2013   History of syphilis    Memory deficit after cerebral infarction    due to infection   Nausea and vomiting 01/14/2019   Neurogenic bladder    secondary to damage from brain infection   Neurogenic bowel    wears depends   Neuromuscular disorder (HCC)    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   Seizure disorder [facility])    neurologist-  dr Marjory Lies ([facility])--caused by cryptococcal meningitis/ multiple infarctions--  no seizure since hospitalization May 2018   Status epilepticus (HCC)    URI (upper respiratory infection) 02/24/2019   Visuospatial deficit     Patient Active Problem List   Diagnosis Date Noted   Hepatic encephalopathy (HCC) 07/04/2019   Encephalopathy, hepatic (HCC) 06/26/2019   Acute CHF (congestive heart failure) (HCC) 06/02/2019   Epistaxis 06/02/2019   Liver failure (HCC) 01/15/2019   Alcoholic hepatitis 01/14/2019   Nausea & vomiting 01/14/2019   Anxiety    Jaundice 01/08/2019   Perianal ulcer (HCC) 11/14/2018   Chronic hepatitis C with hepatic coma (HCC)    Non compliance w medication regimen    Prolonged Q-T interval on ECG  Seizure disorder (HCC) 09/12/2017   S/P VP shunt 06/26/2017   Chronic viral hepatitis B without coma and with delta agent (HCC)    Tobacco abuse    History of cryptococcal meningitis    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   Hypokalemia 09/12/2016   Anemia 09/12/2016   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   HIV infection (HCC) 10/10/2012    Past Surgical History:  Procedure Laterality Date   CYSTOSCOPY N/A 10/10/2017   Procedure: CYSTOSCOPY;  Surgeon:  Sebastian Ache, MD;  Location: [facility];  Service: Urology;  Laterality: N/A;   EXAMINATION UNDER ANESTHESIA N/A 12/28/2014   Procedure: EXAM UNDER ANESTHESIA;  Surgeon: Karie Soda, MD;  Location: [facility];  Service: General;  Laterality: N/A;   FLOOR OF MOUTH BIOPSY N/A 09/16/2016   Procedure: BIOPSY OF ORAL ABCESS;  Surgeon: Newman Pies, MD;  Location: [facility];  Service: ENT;  Laterality: N/A;   INCISION AND DRAINAGE ABSCESS N/A 09/16/2016   Procedure: INCISION AND DRAINAGE ABSCESS;  Surgeon: Newman Pies, MD;  Location: [facility];  Service: ENT;  Laterality: N/A;   INCISION AND DRAINAGE PERIRECTAL ABSCESS  09/ 03/ 2011   dr toth   INCISION AND DRAINAGE PERIRECTAL ABSCESS N/A 12/28/2014   Procedure: IRRIGATION AND DEBRIDEMENT PERIRECTAL ABSCESS;  Surgeon: Karie Soda, MD;  Location: [facility];  Service: General;  Laterality: N/A;  Fistula repair and ablation   INSERTION OF SUPRAPUBIC CATHETER N/A 10/10/2017   Procedure: INSERTION OF SUPRAPUBIC CATHETER;  Surgeon: Sebastian Ache, MD;  [facility] ;  Service: Urology;  Laterality: N/A;   LAPAROSCOPIC REVISION VENTRICULAR-PERITONEAL (V-P) SHUNT N/A 06/12/2017   Procedure: LAPAROSCOPIC REVISION VENTRICULAR-PERITONEAL (V-P) SHUNT;  Surgeon: Kinsinger, De Blanch, MD;  Location: [facility];  Service: General;  Laterality: N/A;   SHUNT REMOVAL N/A 01/01/2018   Procedure: REMOVAL OF V-P SHUNT, PLACEMENT OF EVD;  Surgeon: [name], MD;  Location: [facility];  Service: Neurosurgery;  Laterality: N/A;   TRANSTHORACIC ECHOCARDIOGRAM  06/02/2017   EF 65-70%/  trivial PR   VENTRICULOPERITONEAL SHUNT N/A 06/12/2017   Procedure: SHUNT INSERTION VENTRICULAR-PERITONEAL;  Surgeon: [name], MD;  Location: [facility];  Service: Neurosurgery;  Laterality: N/A;       Family History  Problem Relation Age of Onset   Hypertension Other     Social History   Tobacco Use   Smoking status: Current Every Day Smoker     Packs/day: 0.40    Years: 5.00    Pack years: 2.00    Types: Cigarettes   Smokeless tobacco: Never Used  Substance Use Topics   Alcohol use: Yes   Drug use: No    Home Medications Prior to Admission medications   Medication Sig Start Date End Date Taking? Authorizing Provider  Darunavir-Cobicisctat-Emtricitabine-Tenofovir Alafenamide [facility]) [id number] MG TABS Take 1 tablet by mouth daily with breakfast. 03/25/19   [name], Lisette Grinder, MD  diclofenac sodium (VOLTAREN) 1 % GEL Apply 2 g topically 4 (four) times daily as needed (bilateral ankle pain). 07/07/19   Beola Cord, MD  dolutegravir (TIVICAY) 50 MG tablet Take 1 tablet (50 mg total) by mouth daily. 03/25/19   Randall Hiss, MD  fluconazole (DIFLUCAN) 200 MG tablet Take 200 mg by mouth daily.    [provider]  furosemide (LASIX) 20 MG tablet Take 1 tablet (20 mg total) by mouth daily as needed for fluid or edema (leg swellings). Take extra dose of potassium if you take this medication Patient taking  differently: Take 20 mg by mouth every Monday, Wednesday, and Friday.  06/08/19 06/07/20  Alessandra Bevels, MD  lactulose (CHRONULAC) 10 GM/15ML solution Take 45 mLs (30 g total) by mouth 3 (three) times daily. 07/07/19   Beola Cord, MD  levETIRAcetam (KEPPRA) 1000 MG tablet Take 1 tablet (1,000 mg total) by mouth 2 (two) times daily. 03/25/19   Randall Hiss, MD  Magnesium Oxide 200 MG TABS Take 1 tablet (200 mg total) by mouth daily. Patient not taking: Reported on 07/04/2019 06/08/19   Alessandra Bevels, MD  nicotine (NICODERM CQ - DOSED IN MG/24 HOURS) 21 mg/24hr patch Place 1 patch (21 mg total) onto the skin daily. 07/08/19   Beola Cord, MD  ondansetron (ZOFRAN) 4 MG tablet Take 1 tablet (4 mg total) by mouth every 6 (six) hours as needed for nausea or vomiting. 08/21/19   Yvette Rack, MD  QUEtiapine (SEROQUEL) 25 MG tablet Take 1 tablet (25 mg total) by mouth 2 (two) times daily as  needed (agitation). Patient taking differently: Take 25 mg by mouth every Monday, Wednesday, and Friday.  03/25/19   Randall Hiss, MD  rifaximin (XIFAXAN) 550 MG TABS tablet Take 1 tablet (550 mg total) by mouth 2 (two) times daily. Patient not taking: Reported on 07/04/2019 06/26/19   Iva Boop, MD  spironolactone (ALDACTONE) 50 MG tablet Take 1 tablet (50 mg total) by mouth 2 (two) times daily. 07/30/19   Dellia Cloud, MD  sulfamethoxazole-trimethoprim (BACTRIM) 400-80 MG tablet Take 1 tablet by mouth daily. 06/05/19   Blanchard Kelch, NP  valACYclovir (VALTREX) 1000 MG tablet Take 1 tablet (1,000 mg total) by mouth every Monday, Wednesday, and Friday. 06/09/19   Alessandra Bevels, MD    Allergies    Acyclovir and related  Review of Systems   Review of Systems  Constitutional: Positive for fever (maybe). Negative for chills.  HENT: Negative for ear pain and sore throat.   Eyes: Negative for blurred vision, pain and visual disturbance.  Respiratory: Negative for cough and shortness of breath.   Cardiovascular: Negative for chest pain and palpitations.  Gastrointestinal: Positive for nausea. Negative for abdominal pain and vomiting.  Genitourinary: Negative for dysuria and hematuria.  Musculoskeletal: Negative for arthralgias and back pain.  Skin: Negative for color change and rash.  Neurological: Positive for dizziness, headaches and loss of balance (balance issues recently). Negative for seizures, syncope and numbness.  All other systems reviewed and are negative.   Physical Exam Updated Vital Signs  ED Triage Vitals  Enc Vitals Group     BP 12-21-19 1454 117/77     Pulse Rate 12-21-2019 1454 84     Resp 12/21/2019 1454 18     Temp 2019/12/21 1454 98.5 F (36.9 C)     Temp Source 12-21-19 1454 Oral     SpO2 2019/12/21 1454 99 %     Weight --      Height --      Head Circumference --      Peak Flow --      Pain Score 2019-12-21 1450 7     Pain Loc --      Pain Edu? --        Excl. in GC? --     Physical Exam Vitals and nursing note reviewed.  Constitutional:      General: He is not in acute distress.    Appearance: He is well-developed. He is not ill-appearing.  HENT:  Head: Normocephalic and atraumatic.     Mouth/Throat:     Mouth: Mucous membranes are moist.  Eyes:     Extraocular Movements: Extraocular movements intact.     Right eye: Normal extraocular motion and no nystagmus.     Left eye: Normal extraocular motion and no nystagmus.     Conjunctiva/sclera: Conjunctivae normal.     Pupils: Pupils are equal, round, and reactive to light.  Cardiovascular:     Rate and Rhythm: Normal rate and regular rhythm.     Heart sounds: Normal heart sounds. No murmur.  Pulmonary:     Effort: Pulmonary effort is normal. No respiratory distress.     Breath sounds: Normal breath sounds.  Abdominal:     Palpations: Abdomen is soft.     Tenderness: There is no abdominal tenderness.  Musculoskeletal:        General: Normal range of motion.     Cervical back: Normal range of motion and neck supple.  Skin:    General: Skin is warm and dry.     Capillary Refill: Capillary refill takes less than 2 seconds.  Neurological:     Mental Status: He is alert and oriented to person, place, and time.     Cranial Nerves: No cranial nerve deficit.     Sensory: No sensory deficit.     Motor: No weakness.     Coordination: Coordination normal.  Psychiatric:        Mood and Affect: Mood normal.     ED Results / Procedures / Treatments   Labs (all labs ordered are listed, but only abnormal results are displayed) Labs Reviewed  LACTIC ACID, PLASMA - Abnormal; Notable for the following components:      Result Value   Lactic Acid, Venous 5.8 (*)    All other components within normal limits  COMPREHENSIVE METABOLIC PANEL - Abnormal; Notable for the following components:   Potassium 2.9 (*)    Creatinine, Ser 1.64 (*)    Total Protein 11.0 (*)    Albumin 2.3 (*)     AST 145 (*)    ALT 60 (*)    Total Bilirubin 10.2 (*)    GFR calc non Af Amer 56 (*)    All other components within normal limits  CBC WITH DIFFERENTIAL/PLATELET - Abnormal; Notable for the following components:   Platelets 90 (*)    All other components within normal limits  HEPATIC FUNCTION PANEL - Abnormal; Notable for the following components:   Total Protein 9.8 (*)    Albumin 2.0 (*)    AST 148 (*)    ALT 54 (*)    Total Bilirubin 9.2 (*)    Bilirubin, Direct 4.6 (*)    Indirect Bilirubin 4.6 (*)    All other components within normal limits  AMMONIA - Abnormal; Notable for the following components:   Ammonia 154 (*)    All other components within normal limits  URINALYSIS, ROUTINE W REFLEX MICROSCOPIC - Abnormal; Notable for the following components:   Color, Urine AMBER (*)    Bilirubin Urine SMALL (*)    All other components within normal limits  PROTIME-INR - Abnormal; Notable for the following components:   Prothrombin Time 18.3 (*)    INR 1.5 (*)    All other components within normal limits  CULTURE, BLOOD (ROUTINE X 2)  CULTURE, BLOOD (ROUTINE X 2)  RESPIRATORY PANEL BY RT PCR (FLU A&B, COVID)  CSF CULTURE  GRAM STAIN  CULTURE, FUNGUS WITHOUT  SMEAR  ANAEROBIC CULTURE  LACTIC ACID, PLASMA  LIPASE, BLOOD  T-HELPER CELLS (CD4) COUNT (NOT AT [facility])  HIV-1 RNA QUANT-NO REFLEX-BLD  RAPID URINE DRUG SCREEN, HOSP PERFORMED  CSF CELL COUNT WITH DIFFERENTIAL  CSF CELL COUNT WITH DIFFERENTIAL  PROTEIN AND GLUCOSE, CSF  CRYPTOCOCCAL ANTIGEN, CSF  HERPES SIMPLEX VIRUS(HSV) DNA BY PCR  BASIC METABOLIC PANEL  MAGNESIUM  CBG MONITORING, ED    EKG EKG Interpretation  Date/Time:  Saturday December 20 2019 19:36:15 EST Ventricular Rate:  103 PR Interval:    QRS Duration: 76 QT Interval:  419 QTC Calculation: 549 R Axis:   61 Text Interpretation: Sinus tachycardia Borderline repolarization abnormality Prolonged QT interval Confirmed by Lennice Sites [phone]) on  12/19/2019 7:40:27 PM   Radiology CT HEAD WO CONTRAST  Result Date: 12/11/2019 CLINICAL DATA:  28 year old, current history of HIV, presenting with a 1 week history of headache, vomiting and insomnia which the patient states occurred after incision and drainage of an abscess on the RIGHT side of his forehead. The patient has a personal history of a ventriculoperitoneal shunt catheter which was removed in January, 2019. 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 EXAM: CT HEAD WITHOUT CONTRAST TECHNIQUE: Contiguous axial images were obtained from the base of the skull through the vertex without intravenous contrast. COMPARISON:  01/08/2019 and earlier. FINDINGS: Brain: Mild cortical and deep atrophy, unchanged, advanced for patient age and likely related to the combination of HIV and prior meningoencephalitis. Persistent low-attenuation in both cerebellar hemispheres, RIGHT greater than LEFT, unchanged and likely related to prior ischemia/infarction. Similar ischemic changes in the occipital lobes bilaterally, LEFT greater than RIGHT, progressive since the most recent prior CT in January of this year. Low attenuation involving the frontal lobes, RIGHT greater than LEFT, also progressive since the most recent prior CT. No mass lesion. No acute hemorrhage or hematoma. No extra-axial fluid collections. Vascular: No hyperdense vessel. No visible atherosclerosis. Skull: No skull fracture or other focal osseous abnormality involving the skull. Sinuses/Orbits: Visualized paranasal sinuses, bilateral mastoid air cells and bilateral middle ear cavities well-aerated. Visualized orbits and globes normal in appearance. Other: None. IMPRESSION: 1. Progressive edema and/or ischemic changes in the frontal lobes and the occipital lobes bilaterally since the most recent prior examination in January, 2020. Query encephalitis. MRI of the brain may be helpful in further evaluation to confirm or deny this  finding. 2. Stable ischemic changes involving the cerebellar hemispheres bilaterally, RIGHT greater than LEFT. 3. Stable cortical and deep atrophy which is advanced for patient age, likely related to HIV and the sequela of the prior meningoencephalitis. Electronically Signed   By: Evangeline Dakin M.D.   On: 12/08/2019 19:39   MR BRAIN WO CONTRAST  Addendum Date: 12/21/2019   ADDENDUM REPORT: 12/21/2019 00:13 ADDENDUM: Further comparison made to brain MRI of 05/25/2017 and head CT of 01/08/2019 and 12/31/2017. Since the study of 05/25/2017, there has been marked increase in the size of the lateral and third ventricles. Compared to the 01/08/2019 study, there is increased ballooning of the atria of the lateral ventricles and slightly increased dilatation of the temporal horns. Otherwise, any progressive dilatation is subtle. This was discussed with Dr. Kerney Elbe at 12:10 a.m. on 12/21/2019. Electronically Signed   By: Ulyses Jarred M.D.   On: 12/21/2019 00:13   Result Date: 12/21/2019 CLINICAL DATA:  Encephalopathy. History of HIV with 1 week history of headache. EXAM: MRI HEAD WITHOUT CONTRAST TECHNIQUE: Multiplanar, multiecho pulse sequences of the brain and surrounding  structures were obtained without intravenous contrast. COMPARISON:  Brain MRI 05/25/2017 Head CT December 25, 2019 FINDINGS: The examination had to be discontinued prior to completion due to patient altered mental status and inability to lie still during the study. Only axial and coronal diffusion-weighted imaging was obtained. There is advanced atrophy for age with enlarged lateral ventricles and third ventricle. There is no midline shift or other mass effect. There is no abnormal reduced diffusivity. IMPRESSION: 1. Truncated examination due to patient altered mental status and inability to lie still during the study. 2. No acute ischemia. 3. Advanced atrophy for age. Electronically Signed: By: Deatra Robinson M.D. On: 12/04/2019 23:49     Procedures .Critical Care Performed by: Virgina Norfolk, DO Authorized by: Virgina Norfolk, DO   Critical care provider statement:    Critical care time (minutes):  50   Critical care was necessary to treat or prevent imminent or life-threatening deterioration of the following conditions:  CNS failure or compromise   Critical care was time spent personally by me on the following activities:  Blood draw for specimens, development of treatment plan with patient or surrogate, discussions with consultants, discussions with primary provider, evaluation of patient's response to treatment, examination of patient, obtaining history from patient or surrogate, ordering and performing treatments and interventions, ordering and review of laboratory studies, ordering and review of radiographic studies, pulse oximetry, re-evaluation of patient's condition and review of old charts   I assumed direction of critical care for this patient from another provider in my specialty: no     (including critical care time)  Medications Ordered in ED Medications  cefTRIAXone (ROCEPHIN) 2 g in sodium chloride 0.9 % 100 mL IVPB (0 g Intravenous Stopped 12/21/19 0005)  dexamethasone (DECADRON) injection 10 mg (10 mg Intravenous Given 12/21/19 0034)  acetaminophen (TYLENOL) tablet 650 mg (has no administration in time range)  diphenhydrAMINE (BENADRYL) injection 25 mg (has no administration in time range)    Or  diphenhydrAMINE (BENADRYL) capsule 25 mg (has no administration in time range)  meperidine (DEMEROL) injection 25 mg (has no administration in time range)  sodium chloride 0.9 % bolus 500 mL (500 mLs Intravenous Given 12/14/2019 2324)  dextrose 5% 37ml for flushing before and after ambisome (has no administration in time range)  amphotericin B liposome (AMBISOME) 270 mg in dextrose 5 % 500 mL IVPB (has no administration in time range)  dextrose 5% 36ml for flushing before and after ambisome (has no administration  in time range)  sodium chloride 0.9 % bolus 500 mL (has no administration in time range)  ampicillin (OMNIPEN) 2 g in sodium chloride 0.9 % 100 mL IVPB (2 g Intravenous New Bag/Given 12/21/19 0033)  vancomycin (VANCOREADY) IVPB 2000 mg/400 mL (has no administration in time range)  vancomycin (VANCOREADY) IVPB 1250 mg/250 mL (has no administration in time range)  potassium chloride 10 mEq in 100 mL IVPB (10 mEq Intravenous New Bag/Given 12/21/19 0039)  flucytosine (ANCOBON) capsule 2,250 mg (has no administration in time range)  phytonadione (VITAMIN K) 2.5 mg in dextrose 5 % 50 mL IVPB (2.5 mg Intravenous New Bag/Given 12/21/19 0044)  acyclovir (ZOVIRAX) 915 mg in dextrose 5 % 150 mL IVPB (has no administration in time range)  LORazepam (ATIVAN) 2 MG/ML injection (2 mg  Given 12/04/2019 1900)  levETIRAcetam (KEPPRA) IVPB 1000 mg/100 mL premix (0 mg Intravenous Stopped Dec 25, 2019 2129)  sodium chloride 0.9 % bolus 500 mL (0 mLs Intravenous Stopped 12/21/19 0042)    ED Course  I have  reviewed the triage vital signs and the nursing notes.  Pertinent labs & imaging results that were available during my care of the patient were reviewed by me and considered in my medical decision making (see chart for details).    MDM Rules/Calculators/A&P   KOY LAMP is a 28 year old male with history of bipolar disorder, HIV AIDS noncompliant on medication with recent CD4 count several months ago that was around 100, history of cryptococcal meningitis, history of hepatitis B and hepatitis C, history of seizures, history of depression who presents to the ED with headache.  Patient with normal vitals.  No fever.  Patient has had headache that is right frontal in nature over the past week with ambulation issues.  Subjective fever possibly.  States that he has not taken any of his medications in about 1 week.  Neurologically overall appears intact.  He possibly has some trace weakness in his legs but could be due  to effort.  He does not have any meningeal signs on exam.  No neck stiffness.  Mostly complains of right frontal headache.  He is to have a VP shunt but no longer has that.  Patient has chronic liver dysfunction as well.  Right after my initial history and physical patient appeared to have seizure-like activity and was given 2 mg IM Ativan and loaded with IV Keppra.  Patient has Keppra as one of his medications.  Patient postictal afterwards.  Given his complex medical history I have consulted both neurology and infectious disease.  Patient with ongoing hepatic dysfunction with mildly elevated AST and ALT on exam.  Bilirubin is around 9 which is around baseline as bilirubin is usually around 7.  Ammonia is elevated at 154.  Patient initially with a lactic acid of 1.1 but after seizures 5.8.  Patient given IV fluids.  Hemodynamically stable otherwise.  Patient slowly improving mentally but appears encephalopathic.  Platelet is 90 and INR is 1.5.  Creatinine elevated mildly to 1.6.  Potassium is 2.9.  After talking with both neurology and infectious disease.  We are empirically start IV antibiotic including ceftriaxone, vancomycin, ampicillin, amphotericin B.  We will also give vitamin K for elevated INR.  We will get an MRI with and without contrast to evaluate further as head CT is concerning for progressive edema in the frontal lobes and occipital lobes bilaterally with enlarging ventricles.  Concern for an obstructive hydrocephalus possibly.  Will order MRI with and without contrast but patient will need optimization of INR prior to a lumbar puncture.  Overall concerning for infectious process versus obstructive process in the brain versus both.  Talked with Dr. Otelia Limes with neurology and Dr. Ninetta Lights with infectious disease.    MRI shows some increased size of the lateral and third ventricles compared to prior study.  There is also some slightly increased dilation of the temporal horns.  Overall we will also  add acyclovir for further coverage.  At this time patient awaiting further optimization until he can get LP.  To be admitted to internal medicine service.  Depending on where patient is once INR is corrected we will have to reach out to IR for LP or possibly ED provider.  This chart was dictated using voice recognition software.  Despite best efforts to proofread,  errors can occur which can change the documentation meaning.    Final Clinical Impression(s) / ED Diagnoses Final diagnoses:  Nonintractable headache, unspecified chronicity pattern, unspecified headache type  Seizure (HCC)  Encephalopathy  Rx / DC Orders ED Discharge Orders    None       Virgina NorfolkCuratolo, Nakeitha Milligan, DO 12/21/19 0032    Virgina Norfolkuratolo, Trude Cansler, DO 12/21/19 [phone]

## 2019-12-20 NOTE — Progress Notes (Addendum)
Pharmacy Antibiotic Note  Justin Ball is a 28 y.o. male admitted on Jan 13, 2020 with meningitis.  Pharmacy has been consulted for vancomycin and amphotericin B  Dosing.  Has a hx of HIV and cryptococcal meningitis. Discussed with ED and antibiotics ordered per ID recommendations. WBC 4.7, afebrile. Scr 1.64 (CrCl 75 mL/min). LA 5.8. K 2.9 - 4 IV runs.   Plan: Vancomycin 2 g IV once then 1250 mg IV every 12 hours  Ceftriaxone 2g IV every 12 hours Ampicillin 2g IV every 4 hours Amphotericin B 270 mg (3 mg/kg) every 24 hours Monitor renal fx, cx results, clinical pic, and levels as appropriate F/u recs from ID and results from LP  Height: 6\' 1"  (185.4 cm) Weight: 202 lb (91.6 kg) IBW/kg (Calculated) : 79.9  Temp (24hrs), Avg:98.5 F (36.9 C), Min:98.5 F (36.9 C), Max:98.5 F (36.9 C)  Recent Labs  Lab 01-13-20 1516 2020/01/13 1941  WBC 4.7  --   CREATININE 1.64*  --   LATICACIDVEN 1.1 5.8*    Estimated Creatinine Clearance: 75.8 mL/min (A) (by C-G formula based on SCr of 1.64 mg/dL (H)).    Allergies  Allergen Reactions  . Acyclovir And Related Itching    Antimicrobials this admission: Amphotericin B 12/26 >>  Vancomycin 12/26 >>  Ampicillin 12/26>> Ceftriaxone 12/26>>  Dose adjustments this admission: N/A  Microbiology results: 12/26 BCx: sent 12/26 CSF Cx: ordered 12/26 Resp PCR: sent   Thank you for allowing pharmacy to be a part of this patient's care.  Antonietta Jewel, PharmD, BCCCP Clinical Pharmacist  Phone: [phone]  Please check AMION for all [place] phone numbers After 10:00 PM, call [place] [phone] 13-Jan-2020 11:12 PM   Addum:  Add acyclovir 10 mg/kg IV q8 hours Excell Seltzer, PharmD

## 2019-12-20 NOTE — ED Notes (Signed)
Please keep the family updated on pt's condition periodically. Anderson Malta mother [phone] primary number, sue grandmother [phone]. Thanks

## 2019-12-20 NOTE — ED Notes (Signed)
Pt had a witnessed seizure in hall lasting about a minute. Pt has oral trauma post seizure with bleeding noted the mouth.

## 2019-12-20 NOTE — ED Notes (Signed)
Patient is being discharged from the [facility] and sent to the Emergency Department via personal vehicle by caregiver. Per provider Jaynee Eagles, patient is stable but in need of higher level of care due to past medical Hx of fluid on brain & sudden headache. Patient is aware and verbalizes understanding of plan of care. There were no vitals filed for this visit.

## 2019-12-20 NOTE — Consult Note (Addendum)
NEURO HOSPITALIST CONSULT NOTE   Requestig physician: Dr. Lockie Mola  Reason for Consult: Confusion and headache  History obtained from:  Chart    HPI:                                                                                                                                          SHAUGHN THOMLEY is an 28 y.o. 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 His symptoms started after and incision and drainage of an abscess on the right side of his forehead. While in the ED, he had a witnessed seizure lasting for about 1 minute. There was associated oral trauma with bleeding from his mouth afterwards. Neurology was called for recommendations on seizure management, assessment of imaging studies and for possible CNS infectious process.   The patient has been noncompliant with his medications in the past, as well as for the last week. Current home meds include Keppra.   Past Medical History:  Diagnosis Date  . Aggressive behavior 06/02/2019  . 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 AKI (acute kidney injury) (HCC) 06/19/2019  . Alcoholic hepatitis 01/14/2019  . Anxiety   . 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 Bipolar 1 disorder (HCC)   . Chronic hepatitis C with hepatic coma (HCC)   . 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 Cirrhosis of liver with ascites (HCC)   . Cognitive communication  deficit    working w/ therapy  . 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 Depression   . 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 Encephalopathy, hepatic (HCC) 06/26/2019  . Fecal incontinence 11/19/2017  . Foley catheter in place   . 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 History of scabies 2013  . History of syphilis   . 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 Nausea and vomiting 01/14/2019  . 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 Neurogenic bowel  wears depends  . Neuromuscular disorder (HCC)   . 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 Seizure disorder [facility](HCC)    neurologist-  dr Marjory Liespenumalli ([facility])--caused by cryptococcal meningitis/ multiple infarctions--  no seizure since hospitalization May 2018  . Status epilepticus (HCC)   . URI (upper respiratory infection) 02/24/2019  . Visuospatial deficit     Past Surgical History:  Procedure Laterality Date  . CYSTOSCOPY N/A 10/10/2017   Procedure: CYSTOSCOPY;  Surgeon: Sebastian AcheManny, Theodore, MD;  [facility] [place];  Service: Urology;  Laterality: N/A;  . EXAMINATION UNDER ANESTHESIA N/A 12/28/2014   Procedure: EXAM UNDER ANESTHESIA;  Surgeon: Karie SodaSteven Gross, MD;  Location: [facility];  Service: General;  Laterality: N/A;  . FLOOR OF MOUTH BIOPSY N/A 09/16/2016   Procedure: BIOPSY OF ORAL ABCESS;  Surgeon: Newman PiesSu Teoh, MD;  Location: [facility];  Service: ENT;  Laterality: N/A;  . INCISION AND DRAINAGE ABSCESS N/A 09/16/2016   Procedure: INCISION AND DRAINAGE ABSCESS;  Surgeon: Newman PiesSu Teoh, MD;  Location: [facility];  Service: ENT;  Laterality: N/A;  . INCISION AND DRAINAGE PERIRECTAL ABSCESS   09/ 03/ 2011   dr toth  . INCISION AND DRAINAGE PERIRECTAL ABSCESS N/A 12/28/2014   Procedure: IRRIGATION AND DEBRIDEMENT PERIRECTAL ABSCESS;  Surgeon: Karie SodaSteven Gross, MD;  Location: [facility];  Service: General;  Laterality: N/A;  Fistula repair and ablation  . INSERTION OF SUPRAPUBIC CATHETER N/A 10/10/2017   Procedure: INSERTION OF SUPRAPUBIC CATHETER;  Surgeon: Sebastian AcheManny, Theodore, MD;  [facility] [place];  Service: Urology;  Laterality: N/A;  . LAPAROSCOPIC REVISION VENTRICULAR-PERITONEAL (V-P) SHUNT N/A 06/12/2017   Procedure: LAPAROSCOPIC REVISION VENTRICULAR-PERITONEAL (V-P) SHUNT;  Surgeon: Kinsinger, De BlanchLuke Aaron, MD;  Location: [facility];  Service: General;  Laterality: N/A;  . SHUNT REMOVAL N/A 01/01/2018   Procedure: REMOVAL OF V-P SHUNT, PLACEMENT OF EVD;  Surgeon: Lisbeth RenshawNundkumar, Neelesh, MD;  Location: [facility];  Service: Neurosurgery;  Laterality: N/A;  . TRANSTHORACIC ECHOCARDIOGRAM  06/02/2017   EF 65-70%/  trivial PR  . VENTRICULOPERITONEAL SHUNT N/A 06/12/2017   Procedure: SHUNT INSERTION VENTRICULAR-PERITONEAL;  Surgeon: Lisbeth RenshawNundkumar, Neelesh, MD;  Location: [facility];  Service: Neurosurgery;  Laterality: N/A;    Family History  Problem Relation Age of Onset  . Hypertension Other               Social History:  reports that he has been smoking cigarettes. He has a 2.00 pack-year smoking history. He has never used smokeless tobacco. He reports current alcohol use. He reports that he does not use drugs.  Allergies  Allergen Reactions  . Acyclovir And Related Itching    MEDICATIONS:                                                                                                                     No current facility-administered medications on file prior to encounter.   Current Outpatient Medications on File Prior to Encounter  Medication Sig Dispense Refill  . Darunavir-Cobicisctat-Emtricitabine-Tenofovir Alafenamide (SYMTUZA) 800-150-200-10 MG TABS Take  1 tablet by mouth daily with  breakfast. 30 tablet 11  . diclofenac sodium (VOLTAREN) 1 % GEL Apply 2 g topically 4 (four) times daily as needed (bilateral ankle pain). 50 g 0  . dolutegravir (TIVICAY) 50 MG tablet Take 1 tablet (50 mg total) by mouth daily. 30 tablet 11  . fluconazole (DIFLUCAN) 200 MG tablet Take 200 mg by mouth daily.    . furosemide (LASIX) 20 MG tablet Take 1 tablet (20 mg total) by mouth daily as needed for fluid or edema (leg swellings). Take extra dose of potassium if you take this medication (Patient taking differently: Take 20 mg by mouth every Monday, Wednesday, and Friday. ) 30 tablet 1  . lactulose (CHRONULAC) 10 GM/15ML solution Take 45 mLs (30 g total) by mouth 3 (three) times daily. 236 mL 0  . levETIRAcetam (KEPPRA) 1000 MG tablet Take 1 tablet (1,000 mg total) by mouth 2 (two) times daily. 60 tablet 11  . Magnesium Oxide 200 MG TABS Take 1 tablet (200 mg total) by mouth daily. (Patient not taking: Reported on 07/04/2019) 30 tablet 0  . nicotine (NICODERM CQ - DOSED IN MG/24 HOURS) 21 mg/24hr patch Place 1 patch (21 mg total) onto the skin daily. 28 patch 0  . ondansetron (ZOFRAN) 4 MG tablet Take 1 tablet (4 mg total) by mouth every 6 (six) hours as needed for nausea or vomiting. 30 tablet 3  . QUEtiapine (SEROQUEL) 25 MG tablet Take 1 tablet (25 mg total) by mouth 2 (two) times daily as needed (agitation). (Patient taking differently: Take 25 mg by mouth every Monday, Wednesday, and Friday. ) 60 tablet 11  . rifaximin (XIFAXAN) 550 MG TABS tablet Take 1 tablet (550 mg total) by mouth 2 (two) times daily. (Patient not taking: Reported on 07/04/2019) 60 tablet 11  . spironolactone (ALDACTONE) 50 MG tablet Take 1 tablet (50 mg total) by mouth 2 (two) times daily. 60 tablet 2  . sulfamethoxazole-trimethoprim (BACTRIM) 400-80 MG tablet Take 1 tablet by mouth daily. 30 tablet 3  . valACYclovir (VALTREX) 1000 MG tablet Take 1 tablet (1,000 mg total) by mouth every Monday, Wednesday, and Friday. 30 tablet  1    ROS:                                                                                                                                       Unable to obtain due to AMS.   Blood pressure 117/77, pulse 84, temperature 98.5 F (36.9 C), temperature source Oral, resp. rate 18, SpO2 99 %.   General Examination:  Physical Exam  HEENT-  /AT   Lungs- Respirations unlabored Extremities- Warm and well perfused. Chronic appearing macular rash to BLE.   Neurological Examination Mental Status: Somnolent/postictal. Increased latencies of verbal and motor responses. Not oriented to time, but able to name city and state. Speech hypophonic, slow and sparse, but fluent. No dysarthria other than mild expected slurring in the context of somnolence. Able to name a pen and a nametag. Able to follow simple commands, but often requires repetition.  Cranial Nerves: II: Visual fields grossly intact with no extinction to DSS. PERRL.   III,IV, VI: No ptosis. EOMI with saccadic pursuits noted.  V,VII: Weak smile is symmetric. Facial temp sensation subjectively intact bilaterally.  VIII: hearing intact to voice IX,X: Unable to visualize palate XI: Symmetric XII: Does not follow command for tongue protrusion Motor: Bradykinetic with increased latencies of motor responses x 4.  4/5 strength BUE and BLE. No pronator drift.  Decreased tone x 4.  Sensory: Temp sensation intact in BUE and BLE. FT intact x 4. No extinction.  Deep Tendon Reflexes: 3+ bilateral brachioradialis, biceps and patellae. 2+ achilles bilaterally. Toes downgoing. Cerebellar: No gross ataxia with FNF bilaterally.  Gait: Deferred   Lab Results: Basic Metabolic Panel: Recent Labs  Lab December 22, 2019 1516  NA 138  K 2.9*  CL 107  CO2 22  GLUCOSE 83  BUN 10  CREATININE 1.64*  CALCIUM 8.9    CBC: Recent Labs  Lab 12-22-2019 1516  WBC  4.7  NEUTROABS 3.0  HGB 14.4  HCT 41.0  MCV 94.5  PLT 90*    Cardiac Enzymes: No results for input(s): CKTOTAL, CKMB, CKMBINDEX, TROPONINI in the last 168 hours.  Lipid Panel: No results for input(s): CHOL, TRIG, HDL, CHOLHDL, VLDL, LDLCALC in the last 168 hours.  Imaging: CT HEAD WO CONTRAST  Result Date: 12-22-19 CLINICAL DATA:  28 year old, current history of HIV, presenting with a 1 week history of headache, vomiting and insomnia which the patient states occurred after incision and drainage of an abscess on the RIGHT side of his forehead. The patient has a personal history of a ventriculoperitoneal shunt catheter which was removed in January, 2019. 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 EXAM: CT HEAD WITHOUT CONTRAST TECHNIQUE: Contiguous axial images were obtained from the base of the skull through the vertex without intravenous contrast. COMPARISON:  01/08/2019 and earlier. FINDINGS: Brain: Mild cortical and deep atrophy, unchanged, advanced for patient age and likely related to the combination of HIV and prior meningoencephalitis. Persistent low-attenuation in both cerebellar hemispheres, RIGHT greater than LEFT, unchanged and likely related to prior ischemia/infarction. Similar ischemic changes in the occipital lobes bilaterally, LEFT greater than RIGHT, progressive since the most recent prior CT in January of this year. Low attenuation involving the frontal lobes, RIGHT greater than LEFT, also progressive since the most recent prior CT. No mass lesion. No acute hemorrhage or hematoma. No extra-axial fluid collections. Vascular: No hyperdense vessel. No visible atherosclerosis. Skull: No skull fracture or other focal osseous abnormality involving the skull. Sinuses/Orbits: Visualized paranasal sinuses, bilateral mastoid air cells and bilateral middle ear cavities well-aerated. Visualized orbits and globes normal in appearance. Other: None. IMPRESSION: 1.  Progressive edema and/or ischemic changes in the frontal lobes and the occipital lobes bilaterally since the most recent prior examination in January, 2020. Query encephalitis. MRI of the brain may be helpful in further evaluation to confirm or deny this finding. 2. Stable ischemic changes involving the cerebellar hemispheres bilaterally, RIGHT greater than  LEFT. 3. Stable cortical and deep atrophy which is advanced for patient age, likely related to HIV and the sequela of the prior meningoencephalitis. Electronically Signed   By: Hulan Saas M.D.   On: 12/30/19 19:39    Assessment: 28 year old male with HIV, epilepsy and history of cryptococcal meningitis with VP shunt, presenting to the ED with severe headache and vomiting as well as gait instability. Seizure occurred while in the ED.  Neurology was called for recommendations on seizure management, assessment of imaging studies and for possible CNS infectious process.  1. Seizure most likely secondary to the medication noncompliance x 1 week endorsed by the patient prior to having the seizure and becoming postictal.  2. CT head with progressive edema and/or ischemic changes in the frontal lobes and the occipital lobes bilaterally since the most recent prior examination in January, 2020. Query encephalitis. On personal review of the images, there also appears to have been an increase in the sizes of the lateral ventricles since the prior scan. In conjunction with his headache, this rasises suspicion for subacute communicating hydrocephalus.  3. DDx for headache includes subacute communicating hydrocephalus, fungal, viral or bacterial meningitis.  4. Probable immunocompromised state due to medication noncompliance and recent prior low CD4 counts. WBC count also somewhat low today.  5. Elevated ammonia of 154 may be contributing to his AMS.   Recommendations: 1. MRI brain with and without contrast.  2. Has been loaded with 1000 mg IV Keppra after 2  mg of IM Ativan. Resume his home dose of Keppra (1000 mg BID), but administer this medication IV.  3. LP pending MRI and INR if no contraindication based on the value. Platelets are low at 90 but above the cutoff of 50 K/microliter at which LP would be contraindicated. Obtain cell count with differential, protein, glucose, bacterial cx, fungal cx, HSV PCR, CMV PCR, VZV PCR, bacterial and fungal stains, cryptococcal antigen. Also should save 5-10 cc of frozen CSF for possible additional labs. Will need opening pressure.  4. ID consult.  5. EEG in AM (ordered)  Addendum: -- MRI was limited study due to patient inability to tolerate, with DWI images only. No acute infarction or hemorrhage seen.  -- INR came back at 1.5 -- Pharmacy has recommended vitamin K administration and repeat INR in the AM. If below 1.4, can perform LP at that time.  -- ID recommended empiric steroids, rocephin, vancomycin, ampicillin and amphotericin B. -- EDP also adding a dose of IV acyclovir empirically. ID to comment in AM.    Electronically signed: Dr. Caryl Pina Dec 30, 2019, 7:47 PM

## 2019-12-20 NOTE — ED Notes (Signed)
Patient transported to MRI 

## 2019-12-21 ENCOUNTER — Inpatient Hospital Stay (HOSPITAL_COMMUNITY): Payer: Medicare Other

## 2019-12-21 ENCOUNTER — Encounter (HOSPITAL_COMMUNITY): Payer: Self-pay | Admitting: Internal Medicine

## 2019-12-21 DIAGNOSIS — F17210 Nicotine dependence, cigarettes, uncomplicated: Secondary | ICD-10-CM

## 2019-12-21 DIAGNOSIS — R609 Edema, unspecified: Secondary | ICD-10-CM | POA: Diagnosis not present

## 2019-12-21 DIAGNOSIS — K7290 Hepatic failure, unspecified without coma: Secondary | ICD-10-CM | POA: Diagnosis present

## 2019-12-21 DIAGNOSIS — Z9181 History of falling: Secondary | ICD-10-CM

## 2019-12-21 DIAGNOSIS — G40911 Epilepsy, unspecified, intractable, with status epilepticus: Secondary | ICD-10-CM | POA: Diagnosis not present

## 2019-12-21 DIAGNOSIS — J9601 Acute respiratory failure with hypoxia: Secondary | ICD-10-CM | POA: Diagnosis not present

## 2019-12-21 DIAGNOSIS — R4182 Altered mental status, unspecified: Secondary | ICD-10-CM | POA: Diagnosis not present

## 2019-12-21 DIAGNOSIS — F319 Bipolar disorder, unspecified: Secondary | ICD-10-CM | POA: Diagnosis present

## 2019-12-21 DIAGNOSIS — R4189 Other symptoms and signs involving cognitive functions and awareness: Secondary | ICD-10-CM | POA: Diagnosis not present

## 2019-12-21 DIAGNOSIS — G936 Cerebral edema: Secondary | ICD-10-CM | POA: Diagnosis present

## 2019-12-21 DIAGNOSIS — F419 Anxiety disorder, unspecified: Secondary | ICD-10-CM | POA: Diagnosis present

## 2019-12-21 DIAGNOSIS — K7210 Chronic hepatic failure without coma: Secondary | ICD-10-CM

## 2019-12-21 DIAGNOSIS — R4020 Unspecified coma: Secondary | ICD-10-CM | POA: Diagnosis not present

## 2019-12-21 DIAGNOSIS — B20 Human immunodeficiency virus [HIV] disease: Secondary | ICD-10-CM

## 2019-12-21 DIAGNOSIS — R519 Headache, unspecified: Secondary | ICD-10-CM | POA: Diagnosis not present

## 2019-12-21 DIAGNOSIS — R579 Shock, unspecified: Secondary | ICD-10-CM | POA: Diagnosis not present

## 2019-12-21 DIAGNOSIS — N179 Acute kidney failure, unspecified: Secondary | ICD-10-CM | POA: Diagnosis present

## 2019-12-21 DIAGNOSIS — Z20828 Contact with and (suspected) exposure to other viral communicable diseases: Secondary | ICD-10-CM | POA: Diagnosis present

## 2019-12-21 DIAGNOSIS — K7460 Unspecified cirrhosis of liver: Secondary | ICD-10-CM | POA: Diagnosis not present

## 2019-12-21 DIAGNOSIS — B1920 Unspecified viral hepatitis C without hepatic coma: Secondary | ICD-10-CM | POA: Diagnosis not present

## 2019-12-21 DIAGNOSIS — G9340 Encephalopathy, unspecified: Secondary | ICD-10-CM

## 2019-12-21 DIAGNOSIS — K7010 Alcoholic hepatitis without ascites: Secondary | ICD-10-CM | POA: Diagnosis present

## 2019-12-21 DIAGNOSIS — E872 Acidosis: Secondary | ICD-10-CM | POA: Diagnosis present

## 2019-12-21 DIAGNOSIS — G92 Toxic encephalopathy: Secondary | ICD-10-CM | POA: Diagnosis present

## 2019-12-21 DIAGNOSIS — B1910 Unspecified viral hepatitis B without hepatic coma: Secondary | ICD-10-CM | POA: Diagnosis not present

## 2019-12-21 DIAGNOSIS — Z7189 Other specified counseling: Secondary | ICD-10-CM | POA: Diagnosis not present

## 2019-12-21 DIAGNOSIS — R569 Unspecified convulsions: Secondary | ICD-10-CM | POA: Diagnosis present

## 2019-12-21 DIAGNOSIS — B451 Cerebral cryptococcosis: Secondary | ICD-10-CM | POA: Diagnosis present

## 2019-12-21 DIAGNOSIS — Z8661 Personal history of infections of the central nervous system: Secondary | ICD-10-CM

## 2019-12-21 DIAGNOSIS — B182 Chronic viral hepatitis C: Secondary | ICD-10-CM | POA: Diagnosis present

## 2019-12-21 DIAGNOSIS — R410 Disorientation, unspecified: Secondary | ICD-10-CM | POA: Diagnosis not present

## 2019-12-21 DIAGNOSIS — Z9114 Patient's other noncompliance with medication regimen: Secondary | ICD-10-CM

## 2019-12-21 DIAGNOSIS — Z66 Do not resuscitate: Secondary | ICD-10-CM | POA: Diagnosis present

## 2019-12-21 DIAGNOSIS — Z515 Encounter for palliative care: Secondary | ICD-10-CM | POA: Diagnosis not present

## 2019-12-21 DIAGNOSIS — G910 Communicating hydrocephalus: Secondary | ICD-10-CM | POA: Diagnosis present

## 2019-12-21 DIAGNOSIS — B181 Chronic viral hepatitis B without delta-agent: Secondary | ICD-10-CM | POA: Diagnosis present

## 2019-12-21 DIAGNOSIS — K769 Liver disease, unspecified: Secondary | ICD-10-CM | POA: Diagnosis not present

## 2019-12-21 DIAGNOSIS — G40901 Epilepsy, unspecified, not intractable, with status epilepticus: Secondary | ICD-10-CM | POA: Diagnosis not present

## 2019-12-21 DIAGNOSIS — N182 Chronic kidney disease, stage 2 (mild): Secondary | ICD-10-CM | POA: Diagnosis not present

## 2019-12-21 DIAGNOSIS — D689 Coagulation defect, unspecified: Secondary | ICD-10-CM | POA: Diagnosis present

## 2019-12-21 DIAGNOSIS — E876 Hypokalemia: Secondary | ICD-10-CM | POA: Diagnosis present

## 2019-12-21 DIAGNOSIS — K7030 Alcoholic cirrhosis of liver without ascites: Secondary | ICD-10-CM | POA: Diagnosis present

## 2019-12-21 DIAGNOSIS — I2699 Other pulmonary embolism without acute cor pulmonale: Secondary | ICD-10-CM | POA: Diagnosis not present

## 2019-12-21 LAB — CBC
HCT: 33.6 % — ABNORMAL LOW (ref 39.0–52.0)
HCT: 32.7 % — ABNORMAL LOW (ref 39.0–52.0)
Hemoglobin: 11.9 g/dL — ABNORMAL LOW (ref 13.0–17.0)
Hemoglobin: 11.6 g/dL — ABNORMAL LOW (ref 13.0–17.0)
MCH: 33.1 pg (ref 26.0–34.0)
MCH: 33 pg (ref 26.0–34.0)
MCHC: 35.4 g/dL (ref 30.0–36.0)
MCHC: 35.5 g/dL (ref 30.0–36.0)
MCV: 93.6 fL (ref 80.0–100.0)
MCV: 93.2 fL (ref 80.0–100.0)
Platelets: 80 10*3/uL — ABNORMAL LOW (ref 150–400)
Platelets: 97 10*3/uL — ABNORMAL LOW (ref 150–400)
RBC: 3.59 MIL/uL — ABNORMAL LOW (ref 4.22–5.81)
RBC: 3.51 MIL/uL — ABNORMAL LOW (ref 4.22–5.81)
RDW: 15.4 % (ref 11.5–15.5)
RDW: 15.4 % (ref 11.5–15.5)
WBC: 5.1 10*3/uL (ref 4.0–10.5)
WBC: 7.8 10*3/uL (ref 4.0–10.5)
nRBC: 0 % (ref 0.0–0.2)
nRBC: 0 % (ref 0.0–0.2)

## 2019-12-21 LAB — CBG MONITORING, ED: Glucose-Capillary: 91 mg/dL (ref 70–99)

## 2019-12-21 LAB — COMPREHENSIVE METABOLIC PANEL
ALT: 51 U/L — ABNORMAL HIGH (ref 0–44)
ALT: 49 U/L — ABNORMAL HIGH (ref 0–44)
AST: 113 U/L — ABNORMAL HIGH (ref 15–41)
AST: 109 U/L — ABNORMAL HIGH (ref 15–41)
Albumin: 1.8 g/dL — ABNORMAL LOW (ref 3.5–5.0)
Albumin: 1.9 g/dL — ABNORMAL LOW (ref 3.5–5.0)
Alkaline Phosphatase: 95 U/L (ref 38–126)
Alkaline Phosphatase: 93 U/L (ref 38–126)
Anion gap: 9 (ref 5–15)
Anion gap: 9 (ref 5–15)
BUN: 11 mg/dL (ref 6–20)
BUN: 12 mg/dL (ref 6–20)
CO2: 18 mmol/L — ABNORMAL LOW (ref 22–32)
CO2: 17 mmol/L — ABNORMAL LOW (ref 22–32)
Calcium: 7.7 mg/dL — ABNORMAL LOW (ref 8.9–10.3)
Calcium: 8 mg/dL — ABNORMAL LOW (ref 8.9–10.3)
Chloride: 110 mmol/L (ref 98–111)
Chloride: 111 mmol/L (ref 98–111)
Creatinine, Ser: 1.34 mg/dL — ABNORMAL HIGH (ref 0.61–1.24)
Creatinine, Ser: 1.46 mg/dL — ABNORMAL HIGH (ref 0.61–1.24)
GFR calc Af Amer: >60 mL/min (ref >60)
GFR calc Af Amer: >60 mL/min (ref >60)
GFR calc non Af Amer: >60 mL/min (ref >60)
GFR calc non Af Amer: >60 mL/min (ref >60)
Glucose, Bld: 99 mg/dL (ref 70–99)
Glucose, Bld: 109 mg/dL — ABNORMAL HIGH (ref 70–99)
Potassium: 3.2 mmol/L — ABNORMAL LOW (ref 3.5–5.1)
Potassium: 3.3 mmol/L — ABNORMAL LOW (ref 3.5–5.1)
Sodium: 137 mmol/L (ref 135–145)
Sodium: 137 mmol/L (ref 135–145)
Total Bilirubin: 8.4 mg/dL — ABNORMAL HIGH (ref 0.3–1.2)
Total Bilirubin: 8 mg/dL — ABNORMAL HIGH (ref 0.3–1.2)
Total Protein: 9.1 g/dL — ABNORMAL HIGH (ref 6.5–8.1)
Total Protein: 9.4 g/dL — ABNORMAL HIGH (ref 6.5–8.1)

## 2019-12-21 LAB — URINALYSIS, ROUTINE W REFLEX MICROSCOPIC
Glucose, UA: NEGATIVE mg/dL
Hgb urine dipstick: NEGATIVE
Ketones, ur: NEGATIVE mg/dL
Leukocytes,Ua: NEGATIVE
Nitrite: NEGATIVE
Protein, ur: NEGATIVE mg/dL
Specific Gravity, Urine: 1.012 (ref 1.005–1.030)
pH: 6 (ref 5.0–8.0)

## 2019-12-21 LAB — PROTIME-INR
INR: 1.5 — ABNORMAL HIGH (ref 0.8–1.2)
INR: 1.7 — ABNORMAL HIGH (ref 0.8–1.2)
Prothrombin Time: 18.4 seconds — ABNORMAL HIGH (ref 11.4–15.2)
Prothrombin Time: 19.4 seconds — ABNORMAL HIGH (ref 11.4–15.2)

## 2019-12-21 LAB — RESPIRATORY PANEL BY RT PCR (FLU A&B, COVID)
Influenza A by PCR: NEGATIVE
Influenza B by PCR: NEGATIVE
SARS Coronavirus 2 by RT PCR: NEGATIVE

## 2019-12-21 LAB — RAPID URINE DRUG SCREEN, HOSP PERFORMED
Amphetamines: NOT DETECTED
Barbiturates: NOT DETECTED
Benzodiazepines: NOT DETECTED
Cocaine: NOT DETECTED
Opiates: NOT DETECTED
Tetrahydrocannabinol: NOT DETECTED

## 2019-12-21 LAB — MAGNESIUM
Magnesium: 1.7 mg/dL (ref 1.7–2.4)
Magnesium: 1.8 mg/dL (ref 1.7–2.4)

## 2019-12-21 LAB — PHOSPHORUS: Phosphorus: 2.2 mg/dL — ABNORMAL LOW (ref 2.5–4.6)

## 2019-12-21 LAB — CK: Total CK: 191 U/L (ref 49–397)

## 2019-12-21 LAB — LACTIC ACID, PLASMA: Lactic Acid, Venous: 1.5 mmol/L (ref 0.5–1.9)

## 2019-12-21 LAB — APTT: aPTT: 44 seconds — ABNORMAL HIGH (ref 24–36)

## 2019-12-21 MED ORDER — MAGNESIUM OXIDE 400 (241.3 MG) MG PO TABS
200.0000 mg | ORAL_TABLET | Freq: Every day | ORAL | Status: DC
  Administered 2019-12-21 – 2019-12-22 (×2): 200 mg via ORAL
  Filled 2019-12-21 (×4): qty 1

## 2019-12-21 MED ORDER — THIAMINE HCL 100 MG PO TABS
100.0000 mg | ORAL_TABLET | Freq: Every day | ORAL | Status: DC
  Administered 2019-12-21 – 2019-12-25 (×3): 100 mg via ORAL
  Filled 2019-12-21 (×4): qty 1

## 2019-12-21 MED ORDER — NICOTINE 21 MG/24HR TD PT24
21.0000 mg | MEDICATED_PATCH | Freq: Every day | TRANSDERMAL | Status: DC
  Administered 2019-12-21 – 2019-12-25 (×5): 21 mg via TRANSDERMAL
  Filled 2019-12-21 (×5): qty 1

## 2019-12-21 MED ORDER — DOLUTEGRAVIR SODIUM 50 MG PO TABS
50.0000 mg | ORAL_TABLET | Freq: Every day | ORAL | Status: DC
  Administered 2019-12-21 – 2019-12-22 (×2): 50 mg via ORAL
  Filled 2019-12-21 (×2): qty 1

## 2019-12-21 MED ORDER — FOLIC ACID 1 MG PO TABS
1.0000 mg | ORAL_TABLET | Freq: Every day | ORAL | Status: DC
  Administered 2019-12-21 – 2019-12-22 (×2): 1 mg via ORAL
  Filled 2019-12-21 (×2): qty 1

## 2019-12-21 MED ORDER — LORAZEPAM 2 MG/ML IJ SOLN: 1.0000 mg | INTRAMUSCULAR | Status: DC | PRN

## 2019-12-21 MED ORDER — ADULT MULTIVITAMIN W/MINERALS CH
1.0000 | ORAL_TABLET | Freq: Every day | ORAL | Status: DC
  Administered 2019-12-21 – 2019-12-22 (×2): 1 via ORAL
  Filled 2019-12-21 (×2): qty 1

## 2019-12-21 MED ORDER — ACETAMINOPHEN 325 MG PO TABS
650.0000 mg | ORAL_TABLET | Freq: Four times a day (QID) | ORAL | Status: DC | PRN
  Administered 2019-12-21: 650 mg via ORAL
  Filled 2019-12-21: qty 2

## 2019-12-21 MED ORDER — DARUN-COBIC-EMTRICIT-TENOFAF 800-150-200-10 MG PO TABS
1.0000 | ORAL_TABLET | Freq: Every day | ORAL | Status: DC
  Administered 2019-12-22: 09:00:00 1 via ORAL
  Filled 2019-12-21: qty 1

## 2019-12-21 MED ORDER — LORAZEPAM 1 MG PO TABS: 1.0000 mg | ORAL_TABLET | ORAL | Status: DC | PRN

## 2019-12-21 MED ORDER — RIFAXIMIN 550 MG PO TABS
550.0000 mg | ORAL_TABLET | Freq: Two times a day (BID) | ORAL | Status: DC
  Administered 2019-12-21 – 2019-12-22 (×4): 550 mg via ORAL
  Filled 2019-12-21 (×5): qty 1

## 2019-12-21 MED ORDER — POTASSIUM CHLORIDE CRYS ER 20 MEQ PO TBCR
30.0000 meq | EXTENDED_RELEASE_TABLET | ORAL | Status: AC
  Administered 2019-12-21 (×2): 30 meq via ORAL
  Filled 2019-12-21 (×2): qty 1

## 2019-12-21 MED ORDER — LACTULOSE 10 GM/15ML PO SOLN
30.0000 g | Freq: Three times a day (TID) | ORAL | Status: DC
  Administered 2019-12-21 – 2019-12-22 (×5): 30 g via ORAL
  Filled 2019-12-21 (×2): qty 45
  Filled 2019-12-21: qty 60
  Filled 2019-12-21 (×3): qty 45
  Filled 2019-12-21 (×3): qty 60

## 2019-12-21 MED ORDER — LEVETIRACETAM IN NACL 1000 MG/100ML IV SOLN
1000.0000 mg | Freq: Two times a day (BID) | INTRAVENOUS | Status: DC
  Administered 2019-12-21 – 2019-12-22 (×3): 1000 mg via INTRAVENOUS
  Filled 2019-12-21 (×3): qty 100

## 2019-12-21 MED ORDER — SODIUM CHLORIDE 0.9 % IV BOLUS
1000.0000 mL | Freq: Once | INTRAVENOUS | Status: AC
  Administered 2019-12-21: 1000 mL via INTRAVENOUS

## 2019-12-21 MED ORDER — MAGNESIUM SULFATE 4 GM/100ML IV SOLN
4.0000 g | Freq: Once | INTRAVENOUS | Status: AC
  Administered 2019-12-21: 17:00:00 4 g via INTRAVENOUS
  Filled 2019-12-21: qty 100

## 2019-12-21 MED ORDER — ENOXAPARIN SODIUM 40 MG/0.4ML ~~LOC~~ SOLN
40.0000 mg | SUBCUTANEOUS | Status: DC
  Administered 2019-12-22: 01:00:00 40 mg via SUBCUTANEOUS
  Filled 2019-12-21: qty 0.4

## 2019-12-21 MED ORDER — DEXTROSE 50 % IV SOLN
INTRAVENOUS | Status: AC
  Filled 2019-12-21: qty 50

## 2019-12-21 MED ORDER — DEXTROSE 5 % IV SOLN
10.0000 mg/kg | Freq: Three times a day (TID) | INTRAVENOUS | Status: DC
  Administered 2019-12-21 – 2019-12-23 (×8): 915 mg via INTRAVENOUS
  Filled 2019-12-21 (×10): qty 18.3

## 2019-12-21 MED ORDER — SPIRONOLACTONE 25 MG PO TABS
50.0000 mg | ORAL_TABLET | Freq: Two times a day (BID) | ORAL | Status: DC
  Administered 2019-12-21 – 2019-12-22 (×2): 50 mg via ORAL
  Filled 2019-12-21 (×4): qty 2

## 2019-12-21 MED ORDER — THIAMINE HCL 100 MG/ML IJ SOLN
100.0000 mg | Freq: Every day | INTRAMUSCULAR | Status: DC
  Administered 2019-12-23 – 2019-12-24 (×2): 100 mg via INTRAVENOUS
  Filled 2019-12-21 (×2): qty 2

## 2019-12-21 MED ORDER — ACETAMINOPHEN 650 MG RE SUPP: 650.0000 mg | Freq: Four times a day (QID) | RECTAL | Status: DC | PRN

## 2019-12-21 NOTE — H&P (Addendum)
Date: 12/21/2019               Justin Ball Name:  Justin Ball MRN: [id number]  DOB: Dec 19, 1991 Age / Sex: 28 y.o., male   PCP: Dolan Amen, MD         Medical Service: Internal Medicine Teaching Service         Attending Physician: Dr. Reymundo Poll, MD    First Contact: Ephriam Knuckles, MD, Rylee Pager: [phone])  Second Contact: Masoudi, MD, Ellie Pager: EM [phone])       After Hours (After 5p/  First Contact Pager: [phone]  weekends / holidays): Second Contact Pager: [phone]   Chief Complaint: headache and seizure   History of Present Illness: Justin Ball is a  29 y.o. 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 Justin Ball is a poor historian. However, he notes that he has had a right frontal headache for one week duration that is constant and throbbing in nature. He has tried taking Tylenol for it without any significant improvement. He also endorses subjective fevers, chills,  nausea, vomiting and decreased appetite for this duration and epigastric pain. He notes that he has not had a bowel movement over the past week. He also reports medication nonadherence for the past week as he was visiting his sister. He denies any lightheadedness/dizziness, visual changes, neck pain or rigidity, generalized myalgias, chest pain, shortness of breath, diarrhea, or recent sick contacts.    ED course:  Justin Ball presented with headache. He was noted to be afebrile and normal vital signs and without any obvious neurologic deficits.Justin Ball was noted to have seizure-like activity in the ED and was given Ativan and Keppra. On labs, he was noted to have elevated liver enzymes and  ammonia levels. Lactic acidosis after the seizure of 5.8. Plt 90 and INR 1.5. Vitamin K given for elevated INR. Also noted to be hypokalemic with K 2.9. Justin Ball noted to be encephalopathic. CT head concerning for progressive edema in frontal and occipital lobes bilaterally w/enlarging ventricles. 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 Neurology and ID consulted. MRI with increased size of lateral and third ventricles and slightly increased dilation of temporal horns. Justin Ball empirically started on IV ceftriaxone, vancomycin, ampicillin, acyclovir and amphotericin B. Justin Ball admitted to internal medicine for further evaluation and management.    Meds:  Symtuza 800-150-200-10mg  qd Tivicay  qd Diflucan  qd Bactrim 400-80 qd Valacyclovir  MWF Lasix  qd prn  Lactulose 30g tid  Rifaximin  bid Spironolactone  bid Keppra  bid Seroquel  bid prn Nicoderm 1 patch qd Zofran  q6h prn   Allergies: Allergies as of 11/28/2019 - Review Complete 09/10/2019  Allergen Reaction Noted  . Acyclovir and related Itching 12/31/2017   Past Medical History:  Diagnosis Date  . Aggressive behavior 06/02/2019  . AIDS due to HIV-I [facility]) infectious disease--- dr Zenaida Niece dam   CD4=50 on 08-02-2017  . AKI (acute kidney injury) (HCC) 06/19/2019  . Alcoholic hepatitis 01/14/2019  . Anxiety   . 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 Bipolar 1 disorder (HCC)   . Chronic hepatitis C with hepatic coma (HCC)   . Chronic viral  hepatitis B without coma and with delta agent (HCC)   . Cirrhosis of liver with ascites (HCC)   . Cognitive communication deficit    working w/ therapy  . 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 Depression   . 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 Encephalopathy, hepatic (HCC) 06/26/2019  . Fecal incontinence 11/19/2017  . Foley catheter in place   . 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 History of scabies 2013  . History of syphilis   . 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 Nausea and vomiting 01/14/2019  . 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 Neurogenic bowel    wears depends  . Neuromuscular disorder (HCC)   . 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 Seizure disorder [facility])    neurologist-  dr Marjory Lies ([facility])--caused by cryptococcal meningitis/ multiple infarctions--  no seizure since hospitalization May 2018  . Status epilepticus (HCC)   . URI (upper respiratory infection) 02/24/2019  . Visuospatial deficit     Family History:  Family History  Problem Relation Age of Onset  . Hypertension Other   . Breast cancer Maternal Grandmother      Social History:  Social History   Tobacco Use  . Smoking status: Current Every Day Smoker    Packs/day: 0.40    Years: 5.00    Pack years: 2.00    Types: Cigarettes  . Smokeless tobacco: Never Used  Substance Use Topics  . Alcohol use: Yes  . Drug use: No   Justin Ball lives at home with his grandmother who is his legal guardian and primary caretaker. He reports smoking 2-3 cigarettes per day for the past 1-2 years. He also notes alcohol use with drinking about 2-3 bottles (minimum) of alcohol per day. He notes last alcohol use was several days ago. He denies any illicit drug use.   Review of Systems: A complete ROS was negative except as per HPI.   Physical Exam: Blood pressure 116/74, pulse 75, temperature 98.5 F (36.9 C), temperature source Oral, resp. rate 14, height 6\' 1"  (1.854 m), weight 91.6 kg, SpO2 96  %. Physical Exam Vitals and nursing note reviewed.  Constitutional:      General: He is not in acute distress.    Appearance: He is well-developed. He is ill-appearing. He is not diaphoretic.  HENT:     Head: Normocephalic and atraumatic.     Mouth/Throat:     Mouth: Mucous membranes are moist.  Eyes:     General: Scleral icterus present.     Extraocular Movements: Extraocular movements intact.     Pupils: Pupils are equal, round, and reactive to light.  Cardiovascular:     Rate and Rhythm: Normal rate and regular rhythm.     Heart sounds: Normal heart sounds. No murmur. No friction rub. No gallop.   Pulmonary:     Effort: Pulmonary effort is normal. No respiratory distress.     Breath sounds: Normal breath sounds. No wheezing, rhonchi or rales.  Chest:     Chest wall: No tenderness.  Abdominal:     General: Bowel sounds are normal. There is no distension.  Palpations: Abdomen is soft.     Tenderness: There is abdominal tenderness. There is no guarding.     Comments: Mild tenderness to palpation of epigastrium   Musculoskeletal:        General: Normal range of motion.     Cervical back: Normal range of motion and neck supple. No rigidity.  Lymphadenopathy:     Cervical: No cervical adenopathy.  Skin:    General: Skin is warm and dry.     Capillary Refill: Capillary refill takes less than 2 seconds.  Neurological:     Mental Status: He is lethargic and confused.     Cranial Nerves: No cranial nerve deficit, dysarthria or facial asymmetry.     Sensory: No sensory deficit.     Coordination: Coordination normal.     Deep Tendon Reflexes: Reflexes normal.     Comments: Mental Status:  Justin Ball is lethargic and somnolent. He notes that he is "confused". Oriented to self, location and situation. Speech is delayed but fluent and without any noticeable dysarthria. He is able to follow commands and answer questions appropriately.  Cranial Nerves: II-XII grossly intact   Cerebellar:  Finger-to-nose and heel-to-shin grossly intact.  Motor:  Strength 5/5 in bilateral lower extremities; 4/5 in LUE likely due to effort  Sensation to gross touch intact in bilateral upper and lower extremities.  DTR's: 3+ left brachioradialis; 2+ right brachioradialis, bilateral biceps, patella and achilles    Psychiatric:        Mood and Affect: Mood normal.        Speech: Speech normal.        Behavior: Behavior normal.     EKG: personally reviewed my interpretation is sinus tachycardia w/prolonged QT interval; QTc 549  CT HEAD WO CONTRAST:  IMPRESSION: 1. Progressive edema and/or ischemic changes in the frontal lobes and the occipital lobes bilaterally since the most recent prior examination in January, 2020. Query encephalitis. MRI of the brain may be helpful in further evaluation to confirm or deny this finding. 2. Stable ischemic changes involving the cerebellar hemispheres bilaterally, RIGHT greater than LEFT. 3. Stable cortical and deep atrophy which is advanced for Justin Ball age, likely related to HIV and the sequela of the prior meningoencephalitis.  MR BRAIN WO CONTRAST:  IMPRESSION: 1. Truncated examination due to Justin Ball altered mental status and inability to lie still during the study. 2. No acute ischemia. 3. Advanced atrophy for age. ADDENDUM: Further comparison made to brain MRI of 05/25/2017 and head CT of 01/08/2019 and 12/31/2017. Since the study of 05/25/2017, there has been marked increase in the size of the lateral and third ventricles. Compared to the 01/08/2019 study, there is increased ballooning of the atria of the lateral ventricles and slightly increased dilatation of the temporal horns. Otherwise, any progressive dilatation is subtle.  Assessment & Plan by Problem: Justin Ball is a 28yo male with history of HIV/AIDS (CD4 count 100), cryptococcal meningoencephalitis requiring shunt placement (removed),  liver cirrhosis secondary to  chronic hepatitis B, C and alcohol use, seizure disorder and cognitive impairments presenting with one week of headache, nausea/vomiting and gait instability and noted to have a seizure in the ED.    Headache:  Encephalopathy: Justin Ball presenting with headache, nausea/vomiting, subjective fevers/chills and decreased oral intake for one week duration. Also noted to have gait instability for this duration. He denies any other meningeal symptoms or recent sick contacts. On examination, Justin Ball is alert and oriented to self, location and situation but does note that he  is "confused". He is lethargic on examination but easily arouseable and following commands. No obvious neurologic deficits noted on exam. He is afebrile and no leukocytosis noted on labs. CT head with progressive edema +/- ischemic changes in frontal and occipital lobes bilaterally suggestive of encephalitis. Per neurology, also concerns for subacute communicating hydrocephalus as the sizes of lateral ventricles appeared increased. This was confirmed with an MRI brian which noted increase in size of lateral and third ventricles and slight increased dilatation of temporal horns. Given his immunocompromised state, concerns for meningitis vs subacute communicating hydrocephalus. He does have a history of cirrhosis and has been noncompliant with his lactulose and rifaxamin and was noted to have elevated ammonia levels which could also be contributing to his encephalopathy.  - Vancomycin 1250mg  q12h - Ceftriaxone 2g q12h - Ampicillin 2g q4h - Acyclovir 915mg  q8h - Amphotericin B 270mg  q24h - Flucytosine 2250mg  q6h - Decadron 10mg  q6h - Acetaminophen 650mg  q6h prn - f/u blood cultures  - LP in AM pending INR <1.4 testing for CSF cell count w/diff, cryptococcal Ag, HSV, protein and glucose - Neurology and ID are following, we appreciate their recommendations   Seizure:  Justin Ball with history of seizure disorder on Keppra at home. He notes  medication nonadherence for the past week. He was noted to have seizure-like activity in the ED and was subsequently given Ativan and loaded with Keppra. UA and UTox negative. Justin Ball's seizure is likely secondary to medication noncompliance over the past week. Neurology consulted and on board, appreciate their recommendations.  - IV Keppra 1000mg  q12h - EEG in the AM  Acute kidney injury: Hypokalemia:  Lactic acidosis:  Justin Ball with elevated sCr of 1.64 (baseline 1.1-1.2 in 08/2019). Also noted to be hypokalemic today. No significant ST changes noted on EKG. He was given a small fluid bolus and also potassium repletion. His elevated serum creatinine and electrolyte abnormalities are likely secondary to dehydration in setting of poor oral intake over the past week. Lactic acid 5.8 following seizure.  - 1L NS bolus - CMP daily - Replete electrolytes as needed  - f/u lactic acid in AM - CK levels   Elevated liver enzymes: Justin Ball with history of cirrhosis secondary to chronic hepatitis B, C and ongoing alcohol use. AST/ALT 148/54, T.bili 9.2 (Indirect 4.6/ Direct 4.6) and elevated PT/INR 18.3/1.5. Ammonia 154. He is supposed to be taking lactulose and rifaxamin. However, Justin Ball notes medication noncompliance over the past week. On examination, Justin Ball has scleral icterus. He is also noted to have slowed speech but unclear if this is changed from baseline as he has cognitive deficits. Mild asterixis noted on exam. His increased ammonia levels could be contributed to his altered mental status.  - Lactulose 30g tid - Rifaxamin 550mg  bid  - CMP daily  - PT/PTT/INR  Elevated INR:  Justin Ball with INR 1.5. He was given vitamin K in the ED.  - f/u INR in the AM  HIV/AIDS: Justin Ball with history of HIV/AIDS on Symtuza and Biktarvy. He has a questionable history of medication compliance, requiring very close monitoring by ID. His last HIV RNA quant 5480 and CD4 count 110 in July 2020. Holding HIV  medications at the moment due to concern for IRIS. ID is on board and following, we appreciate their recommendations.  -f/u ID recommendations  Thrombocytopenia:  Plt 90 today. His thrombocytopenia is chronic and likely secondary to his HIV/AIDS and Bactrim use. No active bleeding noted on examination.  - CBC daily - Continue to monitor  FEN: Regular diet  Code: FULL DVT Prophlyaxis: Lovenox  Dispo: Admit Justin Ball to Inpatient with expected length of stay greater than 2 midnights.  Signed: Harvie Heck, MD  Internal Medicine, PGY-1 12/21/2019, 1:48 AM  Pager: [phone]

## 2019-12-21 NOTE — Progress Notes (Signed)
EEG complete - results pending 

## 2019-12-21 NOTE — Procedures (Signed)
Patient Name: Justin Ball  MRN: [id number]  EEG attending: Roland Rack  Referring Physician/Provider: Bruce Donath Date: 12/21/2019 Duration: 2  Patient history: 28 year old male with a history of cryptococcal meningitis who presented with breakthrough seizures  Level of alertness: Awake, Asleep  AEDs during EEG study: Levetiracetam  Technical aspects: This EEG study was done with scalp electrodes positioned according to the 10-20 International system of electrode placement. Electrical activity was acquired at a sampling rate of 500Hz  and reviewed with a high frequency filter of 70Hz  and a low frequency filter of 1Hz . EEG data were recorded continuously and digitally stored.   BACKGROUND ACTIVITY: Posterior dominant rhythm: The posterior dominant rhythm consists of 9 Hz activity of moderate voltage (25-35 uV) seen predominantly in posterior head regions.          Slowing: None  EPILEPTIFORM ACTIVITY: Interictal epileptiform activity: None  Ictal Activity: None  OTHER EVENTS: None  SLEEP RECORDINGS:  Symmetric sleep spindles are seen.   ACTIVATION PROCEDURES:  Hyperventilation and photic stimulation were not performed.  IMPRESSION: This study is within normal limits. There was no seizure or evidence of seizure predisposition recorded on this study. Please note that lack of epileptiform activity on EEG does not preclude the possibility of epilepsy.    Roland Rack, MD [facility] [phone]  If 7pm- 7am, please page neurology on call as listed in [place].

## 2019-12-21 NOTE — Progress Notes (Signed)
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 Presented to [place] ED on 12/26 with 1w hx of right frontal h/a and gait instability. While in the ED, patient was noted to have seizure like activity which resolved with ativan.  Subjective  Discussed need for LP and continued abx for which patient is agreeable to. Dr. Johnnye Sima at bedside.  Significant Hospital Events   12/26>admission, seizure-ativan + loading dose keppra. CT with frontal and occiptial lobe edema bilaterally with progressive hydrocephal. Neurology and ID consulted.  Objective   Blood pressure 113/77, pulse 75, temperature 98.5 F (36.9 C), temperature source Oral, resp. rate 13, height 6\' 1"  (1.854 m), weight 91.6 kg, SpO2 98 %.    Examination: GENERAL: non toxic appearing HEENT: head atraumatic. Scleral icterus present. No oral lesions appreciated. Moves neck freely and without pain CARDIAC: tachycardic, regular rhythm. No peripheral edema.  PULMONARY:  Lung sounds clear to auscultation. ABDOMEN: soft. Nontender to palpation.  Nondistended.  NEURO: alert and oriented x3. Difficulty with short term memory. CN II-XII grossly intact. SKIN: small circumferential healed skin lesions involving the upper and lower extremities. PSYCH: A/Ox3. Normal affect  Significant Diagnostic Tests:  12/26 CT head>  Progressive edema and/or ischemic changes in the frontal lobes and the occipital lobes bilaterally since the most recent prior examination in January, 2020. Query encephalitis. Stable ischemic changes involving the cerebellar hemispheres bilaterally, RIGHT greater  than LEFT. Stable cortical and deep atrophy which is advanced for patient age, likely related to HIV and the sequela of the prior meningoencephalitis.  12/27 MRI brain> technically difficult study due to movement interference. Further comparison made to brain MRI of 05/25/2017 and head CT of 01/08/2019 and 12/31/2017. Since the study of 05/25/2017, there has been marked increase in the size of the lateral and third ventricles. Compared to the 01/08/2019 study, there is increased ballooning of the atria of the lateral ventricles and slightly increased dilatation of the temporal horns. Otherwise, any progressive dilatation is subtle.  Micro Data:  12/26 blood cultures>  Antimicrobials:  Ampicillin 12/27>> Rocephin 12/26>> Vancomycin 12/27>> Acyclovir 12/27>> amphoteracin B>>12/27 Flucytosince>>12/27  Labs    CBC Latest Ref Rng & Units 12/21/2019 11/27/2019 09/08/2019  WBC 4.0 - 10.5 K/uL 5.1 4.7 3.2(L)  Hemoglobin 13.0 - 17.0 g/dL 11.9(L) 14.4 10.5(L)  Hematocrit 39.0 - 52.0 % 33.6(L) 41.0 27.7(L)  Platelets 150 - 400 K/uL 80(L) 90(L) 83(LL)   BMP Latest Ref Rng & Units 12/08/2019 09/08/2019 07/30/2019  Glucose 70 - 99 mg/dL 83 82 67  BUN 6 - 20 mg/dL 10 7 10   Creatinine 0.61 - 1.24 mg/dL 1.64(H) 1.18 1.20  BUN/Creat Ratio 9 - 20 - 6(L) 8(L)  Sodium 135 - 145 mmol/L 138 138 137  Potassium 3.5 - 5.1 mmol/L 2.9(L) 3.9 3.5  Chloride 98 - 111 mmol/L 107 107(H) 106  CO2 22 - 32 mmol/L 22 16(L) 18(L)  Calcium 8.9 - 10.3 mg/dL 8.9 8.2(L) 8.5(L)   Hepatic Function Panel     Component Value Date/Time   PROT 9.1 (H) 12/21/2019 0533   PROT 8.8 (H) 09/08/2019 1652   ALBUMIN 1.8 (L) 12/21/2019 0533  ALBUMIN 2.5 (L) 09/08/2019 1652   AST 113 (H) 12/21/2019 0533   ALT 51 (H) 12/21/2019 0533   ALT 57 (H) 02/24/2019 1632   ALKPHOS 95 12/21/2019 0533   BILITOT 8.4 (H) 12/21/2019 0533   BILITOT 6.5 (H) 09/08/2019 1652   BILIDIR 4.6 (H) December 23, 2019 1947   IBILI 4.6 (H) 2019-12-23 1947  Ammonia  154 UDS>NEGATIVE  COVID-19> NEGATIVE UA>small amount of bilirubin.  Lactic acid 1.1>>5.8 (post seizure)  Summary  Justin Ball is a 28 yo male with PMH of AIDS, cryptococcal meningoencphalitis s/p shunt placement and removal, chronic HCV and HBV and alcohol use disorder with cirrhosis, and dementia who presented to [place] ED on 12/26 for 1w history of progressive frontal headache and was found to have progressive cerebral edema brain imaging. He was subsequently admitted to IMTS for further evaluation and management.  Assessment & Plan:  Active Problems:   AMS (altered mental status)  Acute encephalopathy in the setting of AIDS.  Most likely infectious in origin. CT and MRI of brain showing cerebral edema primarily in the frontal and occipital lobes. Given his history of cyroptococcal meningealencephalitis, this would be high on my list. Meningitis antimicrobial spectrum in place. Awaiting LP and subsequent results to work on narrowing spectrum. ID is consulted. Also likely a hepatic component. See below. Eplilepsy. Home medications: keppra 1000mg  BID. Non-compliant over past 10d. Witnessed seizure while in ED which resolved with ativan. Likely occurred as a result of medication non compliance in addition to likely acute encephalitis. Hydrocephalus. S/p shunt placement and removal from 2018 cryptococcal infection. Repeat imaging on admission suggesting a component of obstructive hydrocephalus today. AIDS. Home medications: symtuza, tivicay, diflucan, bactrim and acyclovir.  Last CD4 count 110. Sees Dr. 2019 with ID. Has a history of medication non-compliance and patient notes that he had not been taking any of his medications over the past 10d. ID recommending rechecking HIV RNA and CD4.  Plan Obtain LP. Will follow up on micro results. Continue ampicillin, rocephin, vancomycin, flucytosine, amphotericin B, acyclovir, and dexamethasone.  Restart ART Recheck HIV RNA and CD4 Continue  keppra. Seizure precautions.  Cirrhosis secondary to chronic HCV, HBV and history of alcohol use disorder. Home medications: spironolactone, lactulose and rifaximin. Patient noting that he did not take these over the past 10d.   INR mildly elevated as well for which Vit. K was given in anticipation of LP. Acute on chronic hyperbilirubinemia. Bili chronically elevated around 6 or 7. Up to 10.2 on admission.  Plan: Will restart rifaximin and lactulose and continue to monitor LFTs and INR  CKD 2. Baseline Cr typically in mid 1's. Appears stable today. NAGMA. Bicarb 18. Likely multifactorial. Hypokalemia. 2.9 on admission. 3.2 this morning. Will continue to work on replacement. Best practice:  CODE STATUS: FULL Diet: npo  DVT for prophylaxis: lovenox Dispo: pending medical stabilization   Algis Liming, MD INTERNAL MEDICINE RESIDENT PGY-1 PAGER #: [phone] @TODAY @ 6:33 AM

## 2019-12-21 NOTE — ED Notes (Signed)
EEG at bedside.

## 2019-12-21 NOTE — ED Notes (Signed)
Pt is sinus tach on monitor 

## 2019-12-21 NOTE — Consult Note (Signed)
[facility]    Date of Admission:  2019-02-22   Total days of antibiotics: 1           Acyclovir/amp/ceftriaxone/vanco/decardon/5FC               Reason for Consult: Mental Status Change, AIDS    Referring Provider: ED, IMTS   Assessment: Mental Status Change Hx of Crytpo AIDS Non-compliance CKD 2 (nadir GFR 53) Liver disease (ETOH?) Dementia Hypokalemia   Plan: 1. Obtain LP and wait studies 2. Continue his current anbx, 3. Continue his ART 4. Recheck his HIV RNA and CD4  Comment- Recurrence of his crypto seems very likely (indoent, progressive course). Will add 5FC to his rx.  HSV seems unlikely as does bacterial meningitis (he would be much more ill).  Hopefully his LP will be done soon and will allow us to taper some of his anbx.  My great appreciation to the outstanding work of the IMTS.   Thank you so much for this interesting consult,  Active Problems:   AMS (altered mental status)   . dexamethasone (DECADRON) injection  10 mg Intravenous Q6H  . dextrose      . enoxaparin (LOVENOX) injection  40 mg Subcutaneous Q24H  . flucytosine  25 mg/kg Oral Q6H  . lactulose  30 g Oral TID  . magnesium oxide  200 mg Oral Daily  . nicotine  21 mg Transdermal Daily  . rifaximin  550 mg Oral BID  . sodium chloride  500 mL Intravenous Q24H  . sodium chloride  500 mL Intravenous Q24H    HPI: Justin Ball is a 28 y.o. male with hx of HIV+ (he is unable to recall his meds or when he was dx), comes to ED for reason he is unable to articulate. He is able to say that he has had meningitis prior but can't remember what type (crypto, prev shunt since removed).  He was brought to ED with 1 week of increasing falls and frontal headaches.  He denies f/c, dysphagia. He has been off his medications for 10 days.  In ED he was noted to have seizure like activity. He did not have LP due to INR 1.5 (hx of liver disease, given Vit K in ED).  In ED  he was started on ampho/amp/ceftriaxone/vanco/acyclovir.  His MRI head showed: increase in the size of the lateral and third ventricles. Compared to the 01/08/2019 study, there is increased ballooning of the atria of the lateral ventricles and slightly increased dilatation of the temporal horns. Otherwise, any progressive dilatation is subtle.  He has been afebrile in ED.   HIV 1 RNA Quant (copies/mL)  Date Value  06/25/2019 5,480 (H)  06/19/2019 46 (H)  06/04/2019 90   CD4 T Cell Abs (/uL)  Date Value  06/25/2019 110 (L)  06/19/2019 135 (L)  06/04/2019 84 (L)     Review of Systems: Review of Systems  Constitutional: Negative for chills and fever.  Respiratory: Negative for cough and shortness of breath.   Gastrointestinal: Negative for constipation and diarrhea.  Genitourinary: Negative for dysuria.  Neurological: Positive for headaches.  no dysphagia, denies sick exposures. +falls. Denies photophobia.  Please see HPI. All other systems reviewed and negative.   Past Medical History:  Diagnosis Date  . Aggressive behavior 06/02/2019  . 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 AKI (acute kidney injury) (HCC)  06/19/2019  . Alcoholic hepatitis 01/14/2019  . Anxiety   . 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 Bipolar 1 disorder (HCC)   . Chronic hepatitis C with hepatic coma (HCC)   . 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 Cirrhosis of liver with ascites (HCC)   . Cognitive communication deficit    working w/ therapy  . 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 Depression   . 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 Encephalopathy, hepatic (HCC)  06/26/2019  . Fecal incontinence 11/19/2017  . Foley catheter in place   . 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 History of scabies 2013  . History of syphilis   . 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 Nausea and vomiting 01/14/2019  . 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 Neurogenic bowel    wears depends  . Neuromuscular disorder (HCC)   . 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 Seizure disorder [facility])    neurologist-  dr Marjory Lies ([facility])--caused by cryptococcal meningitis/ multiple infarctions--  no seizure since hospitalization May 2018  . Status epilepticus (HCC)   . URI (upper respiratory infection) 02/24/2019  . Visuospatial deficit     Social History   Tobacco Use  . Smoking status: Current Every Day Smoker    Packs/day: 0.40    Years: 5.00    Pack years: 2.00    Types: Cigarettes  . Smokeless tobacco: Never Used  Substance Use Topics  . Alcohol use: Yes  . Drug use: No    Family History  Problem Relation Age of Onset  . Hypertension Other   . Breast cancer Maternal Grandmother      Medications:  Scheduled: . dexamethasone (DECADRON) injection  10 mg Intravenous Q6H  . dextrose      . enoxaparin (LOVENOX) injection  40 mg Subcutaneous Q24H  . flucytosine  25 mg/kg Oral Q6H  . lactulose  30 g Oral TID  . magnesium oxide  200 mg Oral Daily  . nicotine  21 mg Transdermal Daily  . rifaximin  550 mg Oral BID  . sodium chloride  500 mL Intravenous Q24H  . sodium chloride  500 mL Intravenous Q24H    Abtx:  Anti-infectives (From admission, onward)   Start     Dose/Rate Route Frequency Ordered Stop   12/21/19 1200  vancomycin (VANCOREADY) IVPB 1250 mg/250 mL     1,250 mg 166.7 mL/hr over 90 Minutes Intravenous Every 12 hours 11/30/2019 2312      12/21/19 0100  amphotericin B liposome (AMBISOME) 270 mg in dextrose 5 % 500 mL IVPB     3 mg/kg  91.6 kg 283.8 mL/hr over 120 Minutes Intravenous Every 24 hours 12/04/2019 2312     12/21/19 0100  rifaximin (XIFAXAN) tablet 550 mg     550 mg Oral 2 times daily 12/21/19 0057     12/21/19 0045  acyclovir (ZOVIRAX) 915 mg in dextrose 5 % 150 mL IVPB     10 mg/kg  91.6 kg 168.3 mL/hr over 60  Minutes Intravenous Every 8 hours 12/21/19 0035     12/21/19 0000  ampicillin (OMNIPEN) 2 g in sodium chloride 0.9 % 100 mL IVPB     2 g 300 mL/hr over 20 Minutes Intravenous Every 4 hours 01-19-2020 2312     12/21/19 0000  flucytosine (ANCOBON) capsule 2,250 mg  Status:  Discontinued     25 mg/kg  91.6 kg Oral Every 6 hours 2020/01/19 2312 01-19-20 2327   12/21/19 0000  vancomycin (VANCOREADY) IVPB 2000 mg/400 mL     2,000 mg 200 mL/hr over 120 Minutes Intravenous  Once 01-19-20 2312 12/21/19 0521   12/21/19 0000  flucytosine (ANCOBON) capsule 2,250 mg     25 mg/kg  91.6 kg Oral Every 6 hours 2020-01-19 2327     01-19-20 2215  cefTRIAXone (ROCEPHIN) 2 g in sodium chloride 0.9 % 100 mL IVPB     2 g 200 mL/hr over 30 Minutes Intravenous Every 12 hours 2020-01-19 2214          OBJECTIVE: Blood pressure 109/78, pulse (!) 113, temperature 97.8 F (36.6 C), temperature source Oral, resp. rate 18, height  (1.854 m), weight 91.6 kg, SpO2 99 %.  Physical Exam HENT:     Mouth/Throat:     Mouth: Mucous membranes are moist.     Pharynx: No oropharyngeal exudate.  Eyes:     Extraocular Movements: Extraocular movements intact.     Pupils: Pupils are equal, round, and reactive to light.   Cardiovascular:     Rate and Rhythm: Normal rate and regular rhythm.  Pulmonary:     Effort: Pulmonary effort is normal.     Breath sounds: Normal breath sounds.  Abdominal:     General: Abdomen is flat. Bowel sounds are normal. There is no distension.     Palpations: Abdomen is soft.     Tenderness: There is no  abdominal tenderness. There is no guarding.  Musculoskeletal:     Cervical back: Normal range of motion and neck supple.     Right lower leg: Edema present.     Left lower leg: Edema present.  Neurological:     Mental Status: He is alert.  Psychiatric:        Mood and Affect: Mood normal.        Cognition and Memory: Memory is impaired (3/3 immediate, 1/3 after 3 mintues). He exhibits impaired recent memory.     Lab Results Results for orders placed or performed during the hospital encounter of 2020-01-19 (from the past 48 hour(s))  Respiratory Panel by RT PCR (Flu A&B, Covid) - Nasopharyngeal Swab     Status: None   Collection Time: 01/19/20  2:40 AM   Specimen: Nasopharyngeal Swab  Result Value Ref Range   SARS Coronavirus 2 by RT PCR NEGATIVE NEGATIVE    Comment: (NOTE) SARS-CoV-2 target nucleic acids are NOT DETECTED. The SARS-CoV-2 RNA is generally detectable in upper respiratoy specimens during the acute phase of infection. The lowest concentration of SARS-CoV-2 viral copies this assay can detect is 131 copies/mL. A negative result does not preclude SARS-Cov-2 infection and should not be used as the sole basis for treatment or other patient management decisions. A negative result may occur with  improper specimen collection/handling, submission of specimen other than nasopharyngeal swab, presence of viral mutation(s) within the areas targeted by this assay, and inadequate number of viral copies (<131 copies/mL). A negative result must be combined with clinical observations, patient history, and epidemiological information. The expected result  is Negative. Fact Sheet for Patients:  https://www.moore.com/ Fact Sheet for Healthcare Providers:  https://www.young.biz/ This test is not yet ap proved or cleared by the [place] FDA and  has been authorized for detection and/or diagnosis of SARS-CoV-2 by FDA under an Emergency Use  Authorization (EUA). This EUA will remain  in effect (meaning this test can be used) for the duration of the COVID-19 declaration under Section 564(b)(1) of the Act, 21 U.S.C. section 360bbb-3(b)(1), unless the authorization is terminated or revoked sooner.    Influenza A by PCR NEGATIVE NEGATIVE   Influenza B by PCR NEGATIVE NEGATIVE    Comment: (NOTE) The Xpert Xpress SARS-CoV-2/FLU/RSV assay is intended as an aid in  the diagnosis of influenza from Nasopharyngeal swab specimens and  should not be used as a sole basis for treatment. Nasal washings and  aspirates are unacceptable for Xpert Xpress SARS-CoV-2/FLU/RSV  testing. Fact Sheet for Patients: https://www.moore.com/ Fact Sheet for Healthcare Providers: https://www.young.biz/ This test is not yet approved or cleared by the [place] FDA and  has been authorized for detection and/or diagnosis of SARS-CoV-2 by  FDA under an Emergency Use Authorization (EUA). This EUA will remain  in effect (meaning this test can be used) for the duration of the  Covid-19 declaration under Section 564(b)(1) of the Act, 21  U.S.C. section 360bbb-3(b)(1), unless the authorization is  terminated or revoked. Performed at [facility], [address], [place], [name] [address]   Lactic acid, plasma     Status: None   Collection Time: 12/10/2019  3:16 PM  Result Value Ref Range   Lactic Acid, Venous 1.1 0.5 - 1.9 mmol/L    Comment: Performed at [facility], [address], [place], [name] [address]  Comprehensive metabolic panel     Status: Abnormal   Collection Time: 12/10/2019  3:16 PM  Result Value Ref Range   Sodium 138 135 - 145 mmol/L   Potassium 2.9 (L) 3.5 - 5.1 mmol/L   Chloride 107 98 - 111 mmol/L   CO2 22 22 - 32 mmol/L   Glucose, Bld 83 70 - 99 mg/dL   BUN 10 6 - 20 mg/dL   Creatinine, Ser 0.09 (H) 0.61 - 1.24 mg/dL   Calcium 8.9 8.9 - 23.3 mg/dL   Total Protein 00.7 (H) 6.5 -  8.1 g/dL   Albumin 2.3 (L) 3.5 - 5.0 g/dL   AST 622 (H) 15 - 41 U/L   ALT 60 (H) 0 - 44 U/L   Alkaline Phosphatase 125 38 - 126 U/L   Total Bilirubin 10.2 (H) 0.3 - 1.2 mg/dL   GFR calc non Af Amer 56 (L) >60 mL/min   GFR calc Af Amer >60 >60 mL/min   Anion gap 9 5 - 15    Comment: Performed at [facility], [address] [place], [name] [address]  CBC with Differential     Status: Abnormal   Collection Time: 11/30/2019  3:16 PM  Result Value Ref Range   WBC 4.7 4.0 - 10.5 K/uL   RBC 4.34 4.22 - 5.81 MIL/uL   Hemoglobin 14.4 13.0 - 17.0 g/dL   HCT 45.6 25.6 - 38.9 %   MCV 94.5 80.0 - 100.0 fL   MCH 33.2 26.0 - 34.0 pg   MCHC 35.1 30.0 - 36.0 g/dL   RDW 37.3 42.8 - 76.8 %   Platelets 90 (L) 150 - 400 K/uL    Comment: REPEATED TO VERIFY PLATELET COUNT  CONFIRMED BY SMEAR SPECIMEN CHECKED FOR CLOTS Immature Platelet Fraction may be clinically indicated, consider ordering this additional test [id number]    nRBC 0.0 0.0 - 0.2 %   Neutrophils Relative % 66 %   Neutro Abs 3.0 1.7 - 7.7 K/uL   Lymphocytes Relative 27 %   Lymphs Abs 1.3 0.7 - 4.0 K/uL   Monocytes Relative 6 %   Monocytes Absolute 0.3 0.1 - 1.0 K/uL   Eosinophils Relative 1 %   Eosinophils Absolute 0.0 0.0 - 0.5 K/uL   Basophils Relative 0 %   Basophils Absolute 0.0 0.0 - 0.1 K/uL   Immature Granulocytes 0 %   Abs Immature Granulocytes 0.02 0.00 - 0.07 K/uL    Comment: Performed at [facility], [address] [place], [name] [address]  Lactic acid, plasma     Status: Abnormal   Collection Time: 12/12/2019  7:41 PM  Result Value Ref Range   Lactic Acid, Venous 5.8 (HH) 0.5 - 1.9 mmol/L    Comment: CRITICAL RESULT CALLED TO, READ BACK BY AND VERIFIED WITH: RN E HAMMOND @2055  12/16/2019 BY S GEZAHEGN Performed at [facility], [address], [place], [name] [address]   Hepatic function panel     Status: Abnormal   Collection Time: 12/04/2019  7:47 PM  Result Value Ref Range   Total Protein 9.8  (H) 6.5 - 8.1 g/dL   Albumin 2.0 (L) 3.5 - 5.0 g/dL   AST 098 (H) 15 - 41 U/L   ALT 54 (H) 0 - 44 U/L   Alkaline Phosphatase 106 38 - 126 U/L   Total Bilirubin 9.2 (H) 0.3 - 1.2 mg/dL   Bilirubin, Direct 4.6 (H) 0.0 - 0.2 mg/dL   Indirect Bilirubin 4.6 (H) 0.3 - 0.9 mg/dL    Comment: Performed at [facility], [address] [place], [name] [address]  Lipase, blood     Status: None   Collection Time: 11/27/2019  7:47 PM  Result Value Ref Range   Lipase 39 11 - 51 U/L    Comment: Performed at [facility] Lab, [address], [place], [name] [address]  Ammonia     Status: Abnormal   Collection Time: 11/29/2019  7:47 PM  Result Value Ref Range   Ammonia 154 (H) 9 - 35 umol/L    Comment: Performed at [facility], [address] [place], [name] [address]  Blood culture (routine x 2)     Status: None (Preliminary result)   Collection Time: 12/16/2019  7:47 PM   Specimen: BLOOD  Result Value Ref Range   Specimen Description BLOOD LEFT ANTECUBITAL    Special Requests      BOTTLES DRAWN AEROBIC AND ANAEROBIC Blood Culture results may not be optimal due to an excessive volume of blood received in culture bottles   Culture      NO GROWTH < 12 HOURS Performed at [facility], [address] [place], [name] [address]    Report Status PENDING   Protime-INR     Status: Abnormal   Collection Time: 12/13/2019  7:47 PM  Result Value Ref Range   Prothrombin Time 18.3 (H) 11.4 - 15.2 seconds   INR 1.5 (H) 0.8 - 1.2    Comment: (NOTE) INR goal varies based on device and disease states. Performed at [facility], [address] [place], [name] [address]   CBG monitoring, ED     Status: None  Collection Time: 01/19/20  7:56 PM  Result Value Ref Range   Glucose-Capillary 77 70 - 99 mg/dL  Blood culture (routine x 2)     Status: None (Preliminary result)   Collection Time: 2020/01/19  7:59 PM   Specimen: BLOOD RIGHT HAND  Result Value Ref Range   Specimen  Description BLOOD RIGHT HAND    Special Requests      BOTTLES DRAWN AEROBIC AND ANAEROBIC Blood Culture adequate volume   Culture      NO GROWTH < 12 HOURS Performed at [facility], [address] [place], [name] [address]    Report Status PENDING   Urinalysis, Routine w reflex microscopic     Status: Abnormal   Collection Time: 2020-01-19 11:53 PM  Result Value Ref Range   Color, Urine AMBER (A) YELLOW    Comment: BIOCHEMICALS MAY BE AFFECTED BY COLOR   APPearance CLEAR CLEAR   Specific Gravity, Urine 1.012 1.005 - 1.030   pH 6.0 5.0 - 8.0   Glucose, UA NEGATIVE NEGATIVE mg/dL   Hgb urine dipstick NEGATIVE NEGATIVE   Bilirubin Urine SMALL (A) NEGATIVE   Ketones, ur NEGATIVE NEGATIVE mg/dL   Protein, ur NEGATIVE NEGATIVE mg/dL   Nitrite NEGATIVE NEGATIVE   Leukocytes,Ua NEGATIVE NEGATIVE    Comment: Performed at [facility], [address], [place], [name] [address]  Urine rapid drug screen (hosp performed)     Status: None   Collection Time: 19-Jan-2020 11:53 PM  Result Value Ref Range   Opiates NONE DETECTED NONE DETECTED   Cocaine NONE DETECTED NONE DETECTED   Benzodiazepines NONE DETECTED NONE DETECTED   Amphetamines NONE DETECTED NONE DETECTED   Tetrahydrocannabinol NONE DETECTED NONE DETECTED   Barbiturates NONE DETECTED NONE DETECTED    Comment: (NOTE) DRUG SCREEN FOR MEDICAL PURPOSES ONLY.  IF CONFIRMATION IS NEEDED FOR ANY PURPOSE, NOTIFY LAB WITHIN 5 DAYS. LOWEST DETECTABLE LIMITS FOR URINE DRUG SCREEN Drug Class                     Cutoff (ng/mL) Amphetamine and metabolites    1000 Barbiturate and metabolites    200 Benzodiazepine                 200 Tricyclics and metabolites     300 Opiates and metabolites        300 Cocaine and metabolites        300 THC                            50 Performed at [facility], [address], [place], [name] [address]   Magnesium     Status: None   Collection Time: 12/21/19  5:33 AM  Result Value  Ref Range   Magnesium 1.7 1.7 - 2.4 mg/dL    Comment: Performed at [facility], [address] [place], [name] [address]  Comprehensive metabolic panel     Status: Abnormal   Collection Time: 12/21/19  5:33 AM  Result Value Ref Range   Sodium 137 135 - 145 mmol/L   Potassium 3.2 (L) 3.5 - 5.1 mmol/L   Chloride 110 98 - 111 mmol/L   CO2 18 (L) 22 - 32 mmol/L   Glucose, Bld 99 70 - 99 mg/dL   BUN 11 6 - 20 mg/dL   Creatinine, Ser 2.95 (H) 0.61 - 1.24 mg/dL   Calcium 7.7 (L) 8.9 -  10.3 mg/dL   Total Protein 9.1 (H) 6.5 - 8.1 g/dL   Albumin 1.8 (L) 3.5 - 5.0 g/dL   AST 161 (H) 15 - 41 U/L   ALT 51 (H) 0 - 44 U/L   Alkaline Phosphatase 95 38 - 126 U/L   Total Bilirubin 8.4 (H) 0.3 - 1.2 mg/dL   GFR calc non Af Amer >60 >60 mL/min   GFR calc Af Amer >60 >60 mL/min   Anion gap 9 5 - 15    Comment: Performed at [facility], [address] [place], [name] [address]  CBC     Status: Abnormal   Collection Time: 12/21/19  5:33 AM  Result Value Ref Range   WBC 5.1 4.0 - 10.5 K/uL   RBC 3.59 (L) 4.22 - 5.81 MIL/uL   Hemoglobin 11.9 (L) 13.0 - 17.0 g/dL   HCT 54.0 (L) 98.1 - 19.1 %   MCV 93.6 80.0 - 100.0 fL   MCH 33.1 26.0 - 34.0 pg   MCHC 35.4 30.0 - 36.0 g/dL   RDW 47.8 29.5 - 62.1 %   Platelets 80 (L) 150 - 400 K/uL    Comment: REPEATED TO VERIFY Immature Platelet Fraction may be clinically indicated, consider ordering this additional test [id number] CONSISTENT WITH PREVIOUS RESULT    nRBC 0.0 0.0 - 0.2 %    Comment: Performed at [facility], [address] [place], [name] [address]  Protime-INR     Status: Abnormal   Collection Time: 12/21/19  5:33 AM  Result Value Ref Range   Prothrombin Time 18.4 (H) 11.4 - 15.2 seconds   INR 1.5 (H) 0.8 - 1.2    Comment: (NOTE) INR goal varies based on device and disease states. Performed at [facility], [address] [place], [name] [address]   APTT     Status: Abnormal   Collection Time: 12/21/19   5:33 AM  Result Value Ref Range   aPTT 44 (H) 24 - 36 seconds    Comment:        IF BASELINE aPTT IS ELEVATED, SUGGEST PATIENT RISK ASSESSMENT BE USED TO DETERMINE APPROPRIATE ANTICOAGULANT THERAPY. Performed at [facility], [address], [place], [name] [address]   Lactic acid, plasma     Status: None   Collection Time: 12/21/19  5:33 AM  Result Value Ref Range   Lactic Acid, Venous 1.5 0.5 - 1.9 mmol/L    Comment: Performed at [facility], [address], [place], [name] [address]  CK     Status: None   Collection Time: 12/21/19  5:33 AM  Result Value Ref Range   Total CK 191 49 - 397 U/L    Comment: Performed at [facility], [address], [place], [name] [address]  CBG monitoring, ED     Status: None   Collection Time: 12/21/19  7:52 AM  Result Value Ref Range   Glucose-Capillary 91 70 - 99 mg/dL   Comment 1 Notify RN    Comment 2 Document in Chart       Component Value Date/Time   SDES BLOOD RIGHT HAND 2020/01/15 1959   SPECREQUEST  2020-01-15 1959    BOTTLES DRAWN AEROBIC AND ANAEROBIC Blood Culture adequate volume   CULT  01/15/2020 1959    NO GROWTH < 12 HOURS Performed at [facility], [address], [place], [name] [address]    REPTSTATUS PENDING 01/15/20 [phone]  CT HEAD WO CONTRAST  Result Date: 12/23/2019 CLINICAL DATA:  28 year old, current history of HIV, presenting with a 1 week history of headache, vomiting and insomnia which the patient states occurred after incision and drainage of an abscess on the RIGHT side of his forehead. The patient has a personal history of a ventriculoperitoneal shunt catheter which was removed in January, 2019. 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 EXAM: CT HEAD WITHOUT CONTRAST TECHNIQUE: Contiguous axial images were obtained from the base of the skull through the vertex without intravenous contrast. COMPARISON:  01/08/2019 and earlier. FINDINGS: Brain: Mild cortical  and deep atrophy, unchanged, advanced for patient age and likely related to the combination of HIV and prior meningoencephalitis. Persistent low-attenuation in both cerebellar hemispheres, RIGHT greater than LEFT, unchanged and likely related to prior ischemia/infarction. Similar ischemic changes in the occipital lobes bilaterally, LEFT greater than RIGHT, progressive since the most recent prior CT in January of this year. Low attenuation involving the frontal lobes, RIGHT greater than LEFT, also progressive since the most recent prior CT. No mass lesion. No acute hemorrhage or hematoma. No extra-axial fluid collections. Vascular: No hyperdense vessel. No visible atherosclerosis. Skull: No skull fracture or other focal osseous abnormality involving the skull. Sinuses/Orbits: Visualized paranasal sinuses, bilateral mastoid air cells and bilateral middle ear cavities well-aerated. Visualized orbits and globes normal in appearance. Other: None. IMPRESSION: 1. Progressive edema and/or ischemic changes in the frontal lobes and the occipital lobes bilaterally since the most recent prior examination in January, 2020. Query encephalitis. MRI of the brain may be helpful in further evaluation to confirm or deny this finding. 2. Stable ischemic changes involving the cerebellar hemispheres bilaterally, RIGHT greater than LEFT. 3. Stable cortical and deep atrophy which is advanced for patient age, likely related to HIV and the sequela of the prior meningoencephalitis. Electronically Signed   By: Hulan Saas M.D.   On: 12/23/2019 19:39   MR BRAIN WO CONTRAST  Addendum Date: 12/21/2019   ADDENDUM REPORT: 12/21/2019 00:13 ADDENDUM: Further comparison made to brain MRI of 05/25/2017 and head CT of 01/08/2019 and 12/31/2017. Since the study of 05/25/2017, there has been marked increase in the size of the lateral and third ventricles. Compared to the 01/08/2019 study, there is increased ballooning of the atria of the lateral  ventricles and slightly increased dilatation of the temporal horns. Otherwise, any progressive dilatation is subtle. This was discussed with Dr. Caryl Pina at 12:10 a.m. on 12/21/2019. Electronically Signed   By: Deatra Robinson M.D.   On: 12/21/2019 00:13   Result Date: 12/21/2019 CLINICAL DATA:  Encephalopathy. History of HIV with 1 week history of headache. EXAM: MRI HEAD WITHOUT CONTRAST TECHNIQUE: Multiplanar, multiecho pulse sequences of the brain and surrounding structures were obtained without intravenous contrast. COMPARISON:  Brain MRI 05/25/2017 Head CT 12/24/2019 FINDINGS: The examination had to be discontinued prior to completion due to patient altered mental status and inability to lie still during the study. Only axial and coronal diffusion-weighted imaging was obtained. There is advanced atrophy for age with enlarged lateral ventricles and third ventricle. There is no midline shift or other mass effect. There is no abnormal reduced diffusivity. IMPRESSION: 1. Truncated examination due to patient altered mental status and inability to lie still during the study. 2. No acute ischemia. 3. Advanced atrophy for age. Electronically Signed: By: Deatra Robinson M.D. On: 11/28/2019 23:49   Recent Results (from the past 240 hour(s))  Respiratory Panel by RT PCR (Flu A&B, Covid) -  Nasopharyngeal Swab     Status: None   Collection Time: 12/24/2019  2:40 AM   Specimen: Nasopharyngeal Swab  Result Value Ref Range Status   SARS Coronavirus 2 by RT PCR NEGATIVE NEGATIVE Final    Comment: (NOTE) SARS-CoV-2 target nucleic acids are NOT DETECTED. The SARS-CoV-2 RNA is generally detectable in upper respiratoy specimens during the acute phase of infection. The lowest concentration of SARS-CoV-2 viral copies this assay can detect is 131 copies/mL. A negative result does not preclude SARS-Cov-2 infection and should not be used as the sole basis for treatment or other patient management decisions. A negative  result may occur with  improper specimen collection/handling, submission of specimen other than nasopharyngeal swab, presence of viral mutation(s) within the areas targeted by this assay, and inadequate number of viral copies (<131 copies/mL). A negative result must be combined with clinical observations, patient history, and epidemiological information. The expected result is Negative. Fact Sheet for Patients:  https://www.moore.com/ Fact Sheet for Healthcare Providers:  https://www.young.biz/ This test is not yet ap proved or cleared by the [place] FDA and  has been authorized for detection and/or diagnosis of SARS-CoV-2 by FDA under an Emergency Use Authorization (EUA). This EUA will remain  in effect (meaning this test can be used) for the duration of the COVID-19 declaration under Section 564(b)(1) of the Act, 21 U.S.C. section 360bbb-3(b)(1), unless the authorization is terminated or revoked sooner.    Influenza A by PCR NEGATIVE NEGATIVE Final   Influenza B by PCR NEGATIVE NEGATIVE Final    Comment: (NOTE) The Xpert Xpress SARS-CoV-2/FLU/RSV assay is intended as an aid in  the diagnosis of influenza from Nasopharyngeal swab specimens and  should not be used as a sole basis for treatment. Nasal washings and  aspirates are unacceptable for Xpert Xpress SARS-CoV-2/FLU/RSV  testing. Fact Sheet for Patients: https://www.moore.com/ Fact Sheet for Healthcare Providers: https://www.young.biz/ This test is not yet approved or cleared by the [place] FDA and  has been authorized for detection and/or diagnosis of SARS-CoV-2 by  FDA under an Emergency Use Authorization (EUA). This EUA will remain  in effect (meaning this test can be used) for the duration of the  Covid-19 declaration under Section 564(b)(1) of the Act, 21  U.S.C. section 360bbb-3(b)(1), unless the authorization is  terminated or  revoked. Performed at [facility], [address], [place], [name] [address]   Blood culture (routine x 2)     Status: None (Preliminary result)   Collection Time: 12/13/2019  7:47 PM   Specimen: BLOOD  Result Value Ref Range Status   Specimen Description BLOOD LEFT ANTECUBITAL  Final   Special Requests   Final    BOTTLES DRAWN AEROBIC AND ANAEROBIC Blood Culture results may not be optimal due to an excessive volume of blood received in culture bottles   Culture   Final    NO GROWTH < 12 HOURS Performed at [facility], [address], [place], [name] [address]    Report Status PENDING  Incomplete  Blood culture (routine x 2)     Status: None (Preliminary result)   Collection Time: 11/27/2019  7:59 PM   Specimen: BLOOD RIGHT HAND  Result Value Ref Range Status   Specimen Description BLOOD RIGHT HAND  Final   Special Requests   Final    BOTTLES DRAWN AEROBIC AND ANAEROBIC Blood Culture adequate volume   Culture   Final    NO GROWTH < 12 HOURS Performed at [facility], [address] [place], [name] [address]    Report Status PENDING  Incomplete    Microbiology: Recent Results (from the past 240 hour(s))  Respiratory Panel by RT PCR (Flu A&B, Covid) - Nasopharyngeal Swab     Status: None   Collection Time: 12/10/2019  2:40 AM   Specimen: Nasopharyngeal Swab  Result Value Ref Range Status   SARS Coronavirus 2 by RT PCR NEGATIVE NEGATIVE Final    Comment: (NOTE) SARS-CoV-2 target nucleic acids are NOT DETECTED. The SARS-CoV-2 RNA is generally detectable in upper respiratoy specimens during the acute phase of infection. The lowest concentration of SARS-CoV-2 viral copies this assay can detect is 131 copies/mL. A negative result does not preclude SARS-Cov-2 infection and should not be used as the sole basis for treatment or other patient management decisions. A negative result may occur with  improper specimen collection/handling, submission of specimen  other than nasopharyngeal swab, presence of viral mutation(s) within the areas targeted by this assay, and inadequate number of viral copies (<131 copies/mL). A negative result must be combined with clinical observations, patient history, and epidemiological information. The expected result is Negative. Fact Sheet for Patients:  https://www.moore.com/ Fact Sheet for Healthcare Providers:  https://www.young.biz/ This test is not yet ap proved or cleared by the [place] FDA and  has been authorized for detection and/or diagnosis of SARS-CoV-2 by FDA under an Emergency Use Authorization (EUA). This EUA will remain  in effect (meaning this test can be used) for the duration of the COVID-19 declaration under Section 564(b)(1) of the Act, 21 U.S.C. section 360bbb-3(b)(1), unless the authorization is terminated or revoked sooner.    Influenza A by PCR NEGATIVE NEGATIVE Final   Influenza B by PCR NEGATIVE NEGATIVE Final    Comment: (NOTE) The Xpert Xpress SARS-CoV-2/FLU/RSV assay is intended as an aid in  the diagnosis of influenza from Nasopharyngeal swab specimens and  should not be used as a sole basis for treatment. Nasal washings and  aspirates are unacceptable for Xpert Xpress SARS-CoV-2/FLU/RSV  testing. Fact Sheet for Patients: https://www.moore.com/ Fact Sheet for Healthcare Providers: https://www.young.biz/ This test is not yet approved or cleared by the [place] FDA and  has been authorized for detection and/or diagnosis of SARS-CoV-2 by  FDA under an Emergency Use Authorization (EUA). This EUA will remain  in effect (meaning this test can be used) for the duration of the  Covid-19 declaration under Section 564(b)(1) of the Act, 21  U.S.C. section 360bbb-3(b)(1), unless the authorization is  terminated or revoked. Performed at [facility], [address] [place], [name] [address]    Blood culture (routine x 2)     Status: None (Preliminary result)   Collection Time: 12/21/2019  7:47 PM   Specimen: BLOOD  Result Value Ref Range Status   Specimen Description BLOOD LEFT ANTECUBITAL  Final   Special Requests   Final    BOTTLES DRAWN AEROBIC AND ANAEROBIC Blood Culture results may not be optimal due to an excessive volume of blood received in culture bottles   Culture   Final    NO GROWTH < 12 HOURS Performed at [facility], [address] [place], [name] [address]    Report Status PENDING  Incomplete  Blood culture (routine x 2)     Status: None (Preliminary result)   Collection Time: 12/10/2019  7:59 PM   Specimen: BLOOD RIGHT HAND  Result Value Ref Range Status   Specimen Description BLOOD RIGHT  HAND  Final   Special Requests   Final    BOTTLES DRAWN AEROBIC AND ANAEROBIC Blood Culture adequate volume   Culture   Final    NO GROWTH < 12 HOURS Performed at [facility], [address] [place], [place] [address]    Report Status PENDING  Incomplete    Radiographs and labs were personally reviewed by me.   Bobby Rumpf, MD [facility] [phone] 12/21/2019, 9:46 AM

## 2019-12-21 NOTE — ED Notes (Signed)
Report given to Amy, RN on 3W

## 2019-12-21 NOTE — ED Notes (Signed)
Placed condom cath on patient.

## 2019-12-21 NOTE — ED Notes (Signed)
Patient currently eating his food tray.

## 2019-12-21 NOTE — Progress Notes (Signed)
Pharmacy Antibiotic Note  Justin Ball is a 28 y.o. male admitted on 01-04-20 with meningitis.  Pharmacy has been consulted for vancomycin and amphotericin B  Dosing.  Has a hx of HIV and cryptococcal meningitis. Discussed with ED and antibiotics ordered per ID recommendations. Patient has been off his HIV meds for ~10 days. WBC 4.7, afebrile. Scr 1.64 (CrCl 75 mL/min). LA 5.8. K 2.9 - 4 IV runs.    Height: 6\' 1"  (185.4 cm) Weight: 202 lb (91.6 kg) IBW/kg (Calculated) : 79.9  Temp (24hrs), Avg:98.2 F (36.8 C), Min:97.8 F (36.6 C), Max:98.5 F (36.9 C)  Recent Labs  Lab 2020/01/04 1516 January 04, 2020 1941 12/21/19 0533  WBC 4.7  --  5.1  CREATININE 1.64*  --  1.34*  LATICACIDVEN 1.1 5.8* 1.5    Estimated Creatinine Clearance: 92.8 mL/min (A) (by C-G formula based on SCr of 1.34 mg/dL (H)).    Allergies  Allergen Reactions  . Acyclovir And Related Itching    Antimicrobials this admission: Amphotericin B 12/26 >>  Vancomycin 12/26 >>  Ampicillin 12/26>> Ceftriaxone 12/26>> Flucytosine 12/27 >>  Dose adjustments this admission: N/A  Microbiology results: 12/26 BCx: sent 12/26 CSF Cx: ordered 12/26 Resp PCR: sent   Plan: - Continue Acyclovir 915 mg IV q8h - Continue Vancomycin1250 mg IV every 12 hours if Scr continues to improve may switch to Q8h - Continue Ceftriaxone 2g IV every 12 hours - Continue Ampicillin 2g IV every 4 hours - Continue Amphotericin B 270 mg (3 mg/kg) every 24 hours - Monitor renal fx, cx results, clinical pic, and levels as appropriate - F/u recs from ID and results from LP  Please check AMION for all [place] phone numbers After 10:00 PM, call [place] 12/21/2019 2:23 PM

## 2019-12-21 NOTE — Progress Notes (Addendum)
Internal Medicine Attending Note:  Date: 12/21/2019  Patient name: Justin Ball  Medical record number: [id number]  Date of birth: 09-15-1991   I have seen and evaluated Hassel Neth and discussed their care with the Residency Team.   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 In the ED he had a witnessed seizure and received 2 mg IM ativan and was loaded with IV keppra. Neurology and ID were consulted. He was started on empiric antibiotics and admitted to our service for further management.    This morning, patient is alert and answering questions appropriately but unable to provide much history. MRI was obtained overnight and shows a marked increase in the size of the lateral and third ventricles compared to MRI 2 years ago.   PMHx, Fam Hx, and/or Soc Hx : Per resident note  Vitals:   12/21/19 1300 12/21/19 1315  BP: 112/61   Pulse:    Resp: 20   Temp:    SpO2:  100%   Physical Exam Constitutional: NAD, appears comfortable Cardiovascular: tachycardic, no murmur Pulmonary/Chest: normal effort, on RA Abdominal: Soft, non tender, non distended. +BS.  Extremities: Warm and well perfused.  No edema.  Neurological: A&Ox3, CN II - XII grossly intact. Moving all extremities, no focal weakness.     Assessment and Plan: I have seen and evaluated the patient as outlined above. I agree with the formulated Assessment and Plan as detailed in the residents' note, with the following changes:   Headache: Patient presented to the ED with symptoms of increased intracranial pressure (headache and vomiting), gait instability, and MRI showed marked  increase in the size of the lateral and third ventricles compared to MRI 2 years ago. Differential includes normal pressure hydrocephalus vs. Meningitis /encephalitis in the setting of his immunocompromised state. However I agree with ID that the later seem less likely. Plan for LP today, appreciate neurology and ID recommendations. Continue empiric decadron, vanc/ceftriaxone/ampicillin, amphotericin B, and flucytosine. Also on IV acyclovir per neuro. Monitor QTc, prolonged on admission EKG. Telemetry.   HIV: Non compliant. Tivicay and Symtuza per ID.   Seizure: In the setting of medication non compliance. Appreciate neurology recommendations. Continue Keppra.   AKI: improving, monitor   ?Cirrhosis w/ elevated LFTs and thrombocytopenia Chronic Hep B Chronic Hep C Continued Etoh use  Has history of cirrhosis but most recent abdominal ultrasound and CT showed only hepatic steatosis. Suspect chronic hepatitis, not yet progressed to cirrhosis. Although mild elevated in INR and thrombocytopenia are suggestive of impaired synthetic liver function. Continue lactulose and rifaxamin. Trend labs. Monitor for withdrawal.   Rest per resident note.   Velna Ochs, MD 12/27/20201:36 PM

## 2019-12-21 NOTE — ED Notes (Signed)
Pt getting up from bed multiple times, pt placed back in bed and on monitor each time.

## 2019-12-21 NOTE — Progress Notes (Signed)
Subjective: Awake, alert  Exam: Vitals:   12/21/19 1400 12/21/19 1555  BP: 110/65 114/64  Pulse:  (!) 103  Resp: (!) 22 20  Temp:  97.8 F (36.6 C)  SpO2:  100%   Gen: In bed, NAD Resp: non-labored breathing, no acute distress Abd: soft, nt  Neuro: MS: Oriented x3 CN: EOMI, face symmetric Motor: No drift Sensory: Intact light touch  Impression: 28 year old male with seizure secondary to medication noncompliance.  More concerning is the persistent headache, and he currently has LP pending under fluoroscopy.  From a seizure perspective, no changes except to continue the medication he was not taking.  Recommendations: 1) continue Keppra 2) antibiotics/evaluation per ID 3) please call if neurology can be of any further assistance.  Roland Rack, MD [facility] [phone]  If 7pm- 7am, please page neurology on call as listed in [place].

## 2019-12-21 NOTE — ED Notes (Signed)
Doctor at bedside.

## 2019-12-21 NOTE — ED Notes (Signed)
Admitting MD at bedside.

## 2019-12-21 NOTE — ED Notes (Signed)
ED TO INPATIENT HANDOFF REPORT  ED Nurse Name and Phone #: Percival Spanish [phone]  S Name/Age/Gender Ola Spurr 28 y.o. male Room/Bed: 026C/026C  Code Status   Code Status: Full Code  Home/SNF/Other Home Patient oriented to: self and place Is this baseline? No   Triage Complete: Triage complete  Chief Complaint AMS (altered mental status) [R41.82]  Triage Note C/o headache, vomiting, and unable to sleep x 1 week.  States symptoms started after having I&D of abscess on R side of forehead.    Allergies Allergies  Allergen Reactions  . Acyclovir And Related Itching    Level of Care/Admitting Diagnosis ED Disposition    ED Disposition Condition Comment   Admit  Hospital Area: [facility] [[id number]]  Level of Care: Telemetry Medical [[id number]]  Covid Evaluation: Asymptomatic Screening Protocol (No Symptoms)  Diagnosis: AMS (altered mental status) [[id number]]  Admitting Physician: Reymundo Poll [[id number]]  Attending Physician: Reymundo Poll [[id number]]  Estimated length of stay: past midnight tomorrow  Certification:: I certify this patient will need inpatient services for at least 2 midnights       B Medical/Surgery History Past Medical History:  Diagnosis Date  . Aggressive behavior 06/02/2019  . 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 AKI (acute kidney injury) (HCC) 06/19/2019  . Alcoholic hepatitis 01/14/2019  . Anxiety   . 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 Bipolar 1 disorder (HCC)   . Chronic hepatitis C with hepatic coma (HCC)   . 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 Cirrhosis of liver with ascites (HCC)   . Cognitive communication deficit    working w/ therapy  . 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 Depression   . 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 Encephalopathy, hepatic (HCC) 06/26/2019  . Fecal incontinence 11/19/2017  . Foley catheter in place   . 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 History of scabies 2013  . History of syphilis   . 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 Nausea and vomiting 01/14/2019  . 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 Neurogenic bowel    wears depends  . Neuromuscular disorder (HCC)   . 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 Seizure disorder [facility])    neurologist-  dr Marjory Lies ([facility])--caused by cryptococcal meningitis/ multiple infarctions--  no seizure since hospitalization May 2018  . Status epilepticus (HCC)   . URI (upper respiratory infection) 02/24/2019  . Visuospatial deficit    Past Surgical History:  Procedure Laterality Date  . CYSTOSCOPY N/A 10/10/2017   Procedure: CYSTOSCOPY;  Surgeon: Sebastian Ache, MD;  Location: [facility];  Service: Urology;  Laterality: N/A;  . EXAMINATION UNDER ANESTHESIA N/A 12/28/2014   Procedure: EXAM UNDER ANESTHESIA;  Surgeon: Karie Soda, MD;  Location: [facility];  Service: General;  Laterality: N/A;  . FLOOR OF MOUTH BIOPSY N/A 09/16/2016   Procedure: BIOPSY OF ORAL ABCESS;  Surgeon: Newman Pies, MD;  Location: [facility];  Service: ENT;  Laterality: N/A;  . INCISION AND DRAINAGE ABSCESS N/A 09/16/2016   Procedure: INCISION AND DRAINAGE ABSCESS;  Surgeon: Newman Pies, MD;  Location: [facility];  Service: ENT;  Laterality: N/A;  . INCISION AND DRAINAGE PERIRECTAL ABSCESS  09/ 03/ 2011   dr toth  . INCISION AND DRAINAGE PERIRECTAL ABSCESS N/A 12/28/2014   Procedure: IRRIGATION AND  DEBRIDEMENT PERIRECTAL ABSCESS;  Surgeon: Karie Soda, MD;  Location: [facility];  Service: General;  Laterality: N/A;  Fistula repair and ablation  . INSERTION OF SUPRAPUBIC CATHETER N/A 10/10/2017   Procedure: INSERTION OF SUPRAPUBIC CATHETER;  Surgeon: Sebastian Ache, MD;  Location: [facility];  Service: Urology;  Laterality: N/A;  . LAPAROSCOPIC REVISION VENTRICULAR-PERITONEAL (V-P) SHUNT N/A 06/12/2017   Procedure: LAPAROSCOPIC REVISION VENTRICULAR-PERITONEAL (V-P) SHUNT;  Surgeon: Kinsinger, De Blanch, MD;  Location[facility];  Service: General;  Laterality: N/A;  . SHUNT REMOVAL N/A 01/01/2018   Procedure: REMOVAL OF V-P SHUNT, PLACEMENT OF EVD;  Surgeon: [name], MD;  Location[facility];  Service: Neurosurgery;  Laterality: N/A;  . TRANSTHORACIC ECHOCARDIOGRAM  06/02/2017   EF 65-70%/  trivial PR  . VENTRICULOPERITONEAL SHUNT N/A 06/12/2017   Procedure: SHUNT INSERTION VENTRICULAR-PERITONEAL;  Surgeon: [name], MD;  [facility];  Service: Neurosurgery;  Laterality: N/A;     A IV Location/Drains/Wounds Patient Lines/Drains/Airways Status   Active Line/Drains/Airways    Name:   Placement date:   Placement time:   Site:   Days:   Peripheral IV Jan 02, 2020 Left Antecubital   2020/01/02    2000    Antecubital   1   Peripheral IV 12/21/19 Left Antecubital   12/21/19    0534    Antecubital   less than 1   Peripheral IV 12/21/19 Left Hand   12/21/19    1210    Hand   less than 1   Suprapubic Catheter Non-latex 18 Fr.   10/10/17    1121    Non-latex   802   Incision (Closed) 01/01/18 Head Right   01/01/18    1947     719   Incision - 3 Ports Abdomen 1: Umbilicus 2: Right;Medial;Lateral 3: Superior   06/12/17    1324     922          Intake/Output Last 24 hours  Intake/Output Summary (Last 24 hours) at 12/21/2019 1523 Last data filed at 12/21/2019 1523 Gross per 24 hour  Intake 700 ml  Output --  Net 700 ml    Labs/Imaging Results for orders placed or  performed during the hospital encounter of 01/02/20 (from the past 48 hour(s))  Respiratory Panel by RT PCR (Flu A&B, Covid) - Nasopharyngeal Swab     Status: None   Collection Time: 01-02-20  2:40 AM   Specimen: Nasopharyngeal Swab  Result Value Ref Range   SARS Coronavirus 2 by RT PCR NEGATIVE NEGATIVE    Comment: (NOTE) SARS-CoV-2 target nucleic acids are NOT DETECTED. The SARS-CoV-2 RNA is generally detectable in upper respiratoy specimens during the acute phase of infection. The lowest concentration of SARS-CoV-2 viral copies this assay can detect is 131 copies/mL. A negative result does not preclude SARS-Cov-2 infection and should not be used as the sole basis for treatment or other patient management decisions. A negative result may occur with  improper specimen collection/handling, submission of specimen other than nasopharyngeal swab, presence of viral mutation(s) within the areas targeted by this assay, and inadequate number of viral copies (<131  copies/mL). A negative result must be combined with clinical observations, patient history, and epidemiological information. The expected result is Negative. Fact Sheet for Patients:  https://www.moore.com/ Fact Sheet for Healthcare Providers:  https://www.young.biz/ This test is not yet ap proved or cleared by the [place] FDA and  has been authorized for detection and/or diagnosis of SARS-CoV-2 by FDA under an Emergency Use Authorization (EUA). This EUA will remain  in effect (meaning this test can be used) for the duration of the COVID-19 declaration under Section 564(b)(1) of the Act, 21 U.S.C. section 360bbb-3(b)(1), unless the authorization is terminated or revoked sooner.    Influenza A by PCR NEGATIVE NEGATIVE   Influenza B by PCR NEGATIVE NEGATIVE    Comment: (NOTE) The Xpert Xpress SARS-CoV-2/FLU/RSV assay is intended as an aid in  the diagnosis of influenza from  Nasopharyngeal swab specimens and  should not be used as a sole basis for treatment. Nasal washings and  aspirates are unacceptable for Xpert Xpress SARS-CoV-2/FLU/RSV  testing. Fact Sheet for Patients: https://www.moore.com/ Fact Sheet for Healthcare Providers: https://www.young.biz/ This test is not yet approved or cleared by the [place] FDA and  has been authorized for detection and/or diagnosis of SARS-CoV-2 by  FDA under an Emergency Use Authorization (EUA). This EUA will remain  in effect (meaning this test can be used) for the duration of the  Covid-19 declaration under Section 564(b)(1) of the Act, 21  U.S.C. section 360bbb-3(b)(1), unless the authorization is  terminated or revoked. Performed at [facility], [address], [place], [name] [address]   Lactic acid, plasma     Status: None   Collection Time: 12/24/2019  3:16 PM  Result Value Ref Range   Lactic Acid, Venous 1.1 0.5 - 1.9 mmol/L    Comment: Performed at [facility], [address], [place], [name] [address]  Comprehensive metabolic panel     Status: Abnormal   Collection Time: 12/24/2019  3:16 PM  Result Value Ref Range   Sodium 138 135 - 145 mmol/L   Potassium 2.9 (L) 3.5 - 5.1 mmol/L   Chloride 107 98 - 111 mmol/L   CO2 22 22 - 32 mmol/L   Glucose, Bld 83 70 - 99 mg/dL   BUN 10 6 - 20 mg/dL   Creatinine, Ser 4.78 (H) 0.61 - 1.24 mg/dL   Calcium 8.9 8.9 - 29.5 mg/dL   Total Protein 62.1 (H) 6.5 - 8.1 g/dL   Albumin 2.3 (L) 3.5 - 5.0 g/dL   AST 308 (H) 15 - 41 U/L   ALT 60 (H) 0 - 44 U/L   Alkaline Phosphatase 125 38 - 126 U/L   Total Bilirubin 10.2 (H) 0.3 - 1.2 mg/dL   GFR calc non Af Amer 56 (L) >60 mL/min   GFR calc Af Amer >60 >60 mL/min   Anion gap 9 5 - 15    Comment: Performed at [facility], [address], [place], [name] [address]  CBC with Differential     Status: Abnormal   Collection Time: 12/15/2019  3:16 PM  Result Value  Ref Range   WBC 4.7 4.0 - 10.5 K/uL   RBC 4.34 4.22 - 5.81 MIL/uL   Hemoglobin 14.4 13.0 - 17.0 g/dL   HCT 69.6 29.5 - 28.4 %   MCV 94.5 80.0 - 100.0 fL   MCH 33.2 26.0 - 34.0 pg   MCHC 35.1 30.0 - 36.0 g/dL   RDW 13.2 44.0 - 10.2 %  Platelets 90 (L) 150 - 400 K/uL    Comment: REPEATED TO VERIFY PLATELET COUNT CONFIRMED BY SMEAR SPECIMEN CHECKED FOR CLOTS Immature Platelet Fraction may be clinically indicated, consider ordering this additional test [id number]    nRBC 0.0 0.0 - 0.2 %   Neutrophils Relative % 66 %   Neutro Abs 3.0 1.7 - 7.7 K/uL   Lymphocytes Relative 27 %   Lymphs Abs 1.3 0.7 - 4.0 K/uL   Monocytes Relative 6 %   Monocytes Absolute 0.3 0.1 - 1.0 K/uL   Eosinophils Relative 1 %   Eosinophils Absolute 0.0 0.0 - 0.5 K/uL   Basophils Relative 0 %   Basophils Absolute 0.0 0.0 - 0.1 K/uL   Immature Granulocytes 0 %   Abs Immature Granulocytes 0.02 0.00 - 0.07 K/uL    Comment: Performed at [facility], [address], [place], [name] [address]  Lactic acid, plasma     Status: Abnormal   Collection Time: 12/23/2019  7:41 PM  Result Value Ref Range   Lactic Acid, Venous 5.8 (HH) 0.5 - 1.9 mmol/L    Comment: CRITICAL RESULT CALLED TO, READ BACK BY AND VERIFIED WITH: RN E HAMMOND @2055  12/23/19 BY S GEZAHEGN Performed at [facility], [address], [place], [name] [address]   Hepatic function panel     Status: Abnormal   Collection Time: 12-23-2019  7:47 PM  Result Value Ref Range   Total Protein 9.8 (H) 6.5 - 8.1 g/dL   Albumin 2.0 (L) 3.5 - 5.0 g/dL   AST 295 (H) 15 - 41 U/L   ALT 54 (H) 0 - 44 U/L   Alkaline Phosphatase 106 38 - 126 U/L   Total Bilirubin 9.2 (H) 0.3 - 1.2 mg/dL   Bilirubin, Direct 4.6 (H) 0.0 - 0.2 mg/dL   Indirect Bilirubin 4.6 (H) 0.3 - 0.9 mg/dL    Comment: Performed at [facility], [address] [place], [name] [address]  Lipase, blood     Status: None   Collection Time: 12/23/2019  7:47 PM  Result Value Ref Range    Lipase 39 11 - 51 U/L    Comment: Performed at [facility], [address] [place], [name] [address]  Ammonia     Status: Abnormal   Collection Time: 12/23/2019  7:47 PM  Result Value Ref Range   Ammonia 154 (H) 9 - 35 umol/L    Comment: Performed at [facility], [address] [place], [name] [address]  Blood culture (routine x 2)     Status: None (Preliminary result)   Collection Time: 12/23/19  7:47 PM   Specimen: BLOOD  Result Value Ref Range   Specimen Description BLOOD LEFT ANTECUBITAL    Special Requests      BOTTLES DRAWN AEROBIC AND ANAEROBIC Blood Culture results may not be optimal due to an excessive volume of blood received in culture bottles   Culture      NO GROWTH < 12 HOURS Performed at [facility], [address], [place], [name] [address]    Report Status PENDING   Protime-INR     Status: Abnormal   Collection Time: December 23, 2019  7:47 PM  Result Value Ref Range   Prothrombin Time 18.3 (H) 11.4 - 15.2 seconds   INR 1.5 (H) 0.8 - 1.2    Comment: (NOTE) INR goal varies based on device and disease states. Performed at [facility], [address], [place], [name] [address]   CBG monitoring, ED     Status: None   Collection Time: 11/30/2019  7:56 PM  Result Value Ref Range   Glucose-Capillary 77 70 - 99 mg/dL  Blood culture (routine x 2)     Status: None (Preliminary result)   Collection Time: 12/14/2019  7:59 PM   Specimen: BLOOD RIGHT HAND  Result Value Ref Range   Specimen Description BLOOD RIGHT HAND    Special Requests      BOTTLES DRAWN AEROBIC AND ANAEROBIC Blood Culture adequate volume   Culture      NO GROWTH < 12 HOURS Performed at [facility], [address] [place], [name] [address]    Report Status PENDING   Urinalysis, Routine w reflex microscopic     Status: Abnormal   Collection Time: 12/07/2019 11:53 PM  Result Value Ref Range   Color, Urine AMBER (A) YELLOW    Comment: BIOCHEMICALS MAY BE AFFECTED BY  COLOR   APPearance CLEAR CLEAR   Specific Gravity, Urine 1.012 1.005 - 1.030   pH 6.0 5.0 - 8.0   Glucose, UA NEGATIVE NEGATIVE mg/dL   Hgb urine dipstick NEGATIVE NEGATIVE   Bilirubin Urine SMALL (A) NEGATIVE   Ketones, ur NEGATIVE NEGATIVE mg/dL   Protein, ur NEGATIVE NEGATIVE mg/dL   Nitrite NEGATIVE NEGATIVE   Leukocytes,Ua NEGATIVE NEGATIVE    Comment: Performed at [facility], [address] [place], [name] [address]  Urine rapid drug screen (hosp performed)     Status: None   Collection Time: 12/05/2019 11:53 PM  Result Value Ref Range   Opiates NONE DETECTED NONE DETECTED   Cocaine NONE DETECTED NONE DETECTED   Benzodiazepines NONE DETECTED NONE DETECTED   Amphetamines NONE DETECTED NONE DETECTED   Tetrahydrocannabinol NONE DETECTED NONE DETECTED   Barbiturates NONE DETECTED NONE DETECTED    Comment: (NOTE) DRUG SCREEN FOR MEDICAL PURPOSES ONLY.  IF CONFIRMATION IS NEEDED FOR ANY PURPOSE, NOTIFY LAB WITHIN 5 DAYS. LOWEST DETECTABLE LIMITS FOR URINE DRUG SCREEN Drug Class                     Cutoff (ng/mL) Amphetamine and metabolites    1000 Barbiturate and metabolites    200 Benzodiazepine                 200 Tricyclics and metabolites     300 Opiates and metabolites        300 Cocaine and metabolites        300 THC                            50 Performed at [facility], [address], [place], [name] [address]   Magnesium     Status: None   Collection Time: 12/21/19  5:33 AM  Result Value Ref Range   Magnesium 1.7 1.7 - 2.4 mg/dL    Comment: Performed at [facility], [address] [place], [name] [address]  Comprehensive metabolic panel     Status: Abnormal   Collection Time: 12/21/19  5:33 AM  Result Value Ref Range   Sodium 137 135 - 145 mmol/L   Potassium 3.2 (L) 3.5 - 5.1 mmol/L   Chloride 110 98 - 111 mmol/L   CO2 18 (L) 22 - 32 mmol/L   Glucose, Bld 99 70 - 99 mg/dL   BUN 11 6 - 20  mg/dL   Creatinine, Ser 8.11 (H) 0.61 -  1.24 mg/dL   Calcium 7.7 (L) 8.9 - 10.3 mg/dL   Total Protein 9.1 (H) 6.5 - 8.1 g/dL   Albumin 1.8 (L) 3.5 - 5.0 g/dL   AST 914 (H) 15 - 41 U/L   ALT 51 (H) 0 - 44 U/L   Alkaline Phosphatase 95 38 - 126 U/L   Total Bilirubin 8.4 (H) 0.3 - 1.2 mg/dL   GFR calc non Af Amer >60 >60 mL/min   GFR calc Af Amer >60 >60 mL/min   Anion gap 9 5 - 15    Comment: Performed at [facility], [address] [place], [name] [address]  CBC     Status: Abnormal   Collection Time: 12/21/19  5:33 AM  Result Value Ref Range   WBC 5.1 4.0 - 10.5 K/uL   RBC 3.59 (L) 4.22 - 5.81 MIL/uL   Hemoglobin 11.9 (L) 13.0 - 17.0 g/dL   HCT 62.1 (L) 30.8 - 65.7 %   MCV 93.6 80.0 - 100.0 fL   MCH 33.1 26.0 - 34.0 pg   MCHC 35.4 30.0 - 36.0 g/dL   RDW 84.6 96.2 - 95.2 %   Platelets 80 (L) 150 - 400 K/uL    Comment: REPEATED TO VERIFY Immature Platelet Fraction may be clinically indicated, consider ordering this additional test [id number] CONSISTENT WITH PREVIOUS RESULT    nRBC 0.0 0.0 - 0.2 %    Comment: Performed at [facility], [address], [place], [name] [address]  Protime-INR     Status: Abnormal   Collection Time: 12/21/19  5:33 AM  Result Value Ref Range   Prothrombin Time 18.4 (H) 11.4 - 15.2 seconds   INR 1.5 (H) 0.8 - 1.2    Comment: (NOTE) INR goal varies based on device and disease states. Performed at [facility], [address], [place], [name] [address]   APTT     Status: Abnormal   Collection Time: 12/21/19  5:33 AM  Result Value Ref Range   aPTT 44 (H) 24 - 36 seconds    Comment:        IF BASELINE aPTT IS ELEVATED, SUGGEST PATIENT RISK ASSESSMENT BE USED TO DETERMINE APPROPRIATE ANTICOAGULANT THERAPY. Performed at [facility], [address] [place], [name] [address]   Lactic acid, plasma     Status: None   Collection Time: 12/21/19  5:33 AM  Result Value Ref Range   Lactic Acid, Venous 1.5 0.5 - 1.9 mmol/L    Comment: Performed at [facility], [address], [place], [name] [address]  CK     Status: None   Collection Time: 12/21/19  5:33 AM  Result Value Ref Range   Total CK 191 49 - 397 U/L    Comment: Performed at [facility], [address], [place], [name] [address]  CBG monitoring, ED     Status: None   Collection Time: 12/21/19  7:52 AM  Result Value Ref Range   Glucose-Capillary 91 70 - 99 mg/dL   Comment 1 Notify RN    Comment 2 Document in Chart    EEG  Result Date: 12/21/2019 Rejeana Brock, MD     12/21/2019  2:20 PM Patient Name: ZENITH LAMPHIER MRN: [id number] EEG attending: Ritta Slot Referring Physician/Provider: Agnes Lawrence Date: 12/21/2019 Duration: 2 Patient history: 29 year old male with a history of cryptococcal meningitis who presented  with breakthrough seizures Level of alertness: Awake, Asleep AEDs during EEG study: Levetiracetam Technical aspects: This EEG study was done with scalp electrodes positioned according to the 10-20 International system of electrode placement. Electrical activity was acquired at a sampling rate of 500Hz  and reviewed with a high frequency filter of 70Hz  and a low frequency filter of 1Hz . EEG data were recorded continuously and digitally stored. BACKGROUND ACTIVITY: Posterior dominant rhythm: The posterior dominant rhythm consists of 9 Hz activity of moderate voltage (25-35 uV) seen predominantly in posterior head regions.        Slowing: None EPILEPTIFORM ACTIVITY: Interictal epileptiform activity: None Ictal Activity: None OTHER EVENTS: None SLEEP RECORDINGS: Symmetric sleep spindles are seen. ACTIVATION PROCEDURES: Hyperventilation and photic stimulation were not performed. IMPRESSION: This study is within normal limits. There was no seizure or evidence of seizure predisposition recorded on this study. Please note that lack of epileptiform activity on EEG does not preclude the possibility of epilepsy.  Ritta Slot, MD [facility]  [phone] If 7pm- 7am, please page neurology on call as listed in AMION.   CT HEAD WO CONTRAST  Result Date: 12/22/19 CLINICAL DATA:  28 year old, current history of HIV, presenting with a 1 week history of headache, vomiting and insomnia which the patient states occurred after incision and drainage of an abscess on the RIGHT side of his forehead. The patient has a personal history of a ventriculoperitoneal shunt catheter which was removed in January, 2019. 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 EXAM: CT HEAD WITHOUT CONTRAST TECHNIQUE: Contiguous axial images were obtained from the base of the skull through the vertex without intravenous contrast. COMPARISON:  01/08/2019 and earlier. FINDINGS: Brain: Mild cortical and deep atrophy, unchanged, advanced for patient age and likely related to the combination of HIV and prior meningoencephalitis. Persistent low-attenuation in both cerebellar hemispheres, RIGHT greater than LEFT, unchanged and likely related to prior ischemia/infarction. Similar ischemic changes in the occipital lobes bilaterally, LEFT greater than RIGHT, progressive since the most recent prior CT in January of this year. Low attenuation involving the frontal lobes, RIGHT greater than LEFT, also progressive since the most recent prior CT. No mass lesion. No acute hemorrhage or hematoma. No extra-axial fluid collections. Vascular: No hyperdense vessel. No visible atherosclerosis. Skull: No skull fracture or other focal osseous abnormality involving the skull. Sinuses/Orbits: Visualized paranasal sinuses, bilateral mastoid air cells and bilateral middle ear cavities well-aerated. Visualized orbits and globes normal in appearance. Other: None. IMPRESSION: 1. Progressive edema and/or ischemic changes in the frontal lobes and the occipital lobes bilaterally since the most recent prior examination in January, 2020. Query encephalitis. MRI of the brain may be helpful in  further evaluation to confirm or deny this finding. 2. Stable ischemic changes involving the cerebellar hemispheres bilaterally, RIGHT greater than LEFT. 3. Stable cortical and deep atrophy which is advanced for patient age, likely related to HIV and the sequela of the prior meningoencephalitis. Electronically Signed   By: Hulan Saas M.D.   On: 12-22-19 19:39   MR BRAIN WO CONTRAST  Addendum Date: 12/21/2019   ADDENDUM REPORT: 12/21/2019 00:13 ADDENDUM: Further comparison made to brain MRI of 05/25/2017 and head CT of 01/08/2019 and 12/31/2017. Since the study of 05/25/2017, there has been marked increase in the size of the lateral and third ventricles. Compared to the 01/08/2019 study, there is increased ballooning of the atria of the lateral ventricles and slightly increased dilatation of the temporal horns. Otherwise, any progressive dilatation is subtle. This was discussed  with Dr. Caryl PinaEric Lindzen at 12:10 a.m. on 12/21/2019. Electronically Signed   By: Deatra RobinsonKevin  Herman M.D.   On: 12/21/2019 00:13   Result Date: 12/21/2019 CLINICAL DATA:  Encephalopathy. History of HIV with 1 week history of headache. EXAM: MRI HEAD WITHOUT CONTRAST TECHNIQUE: Multiplanar, multiecho pulse sequences of the brain and surrounding structures were obtained without intravenous contrast. COMPARISON:  Brain MRI 05/25/2017 Head CT 12/17/2019 FINDINGS: The examination had to be discontinued prior to completion due to patient altered mental status and inability to lie still during the study. Only axial and coronal diffusion-weighted imaging was obtained. There is advanced atrophy for age with enlarged lateral ventricles and third ventricle. There is no midline shift or other mass effect. There is no abnormal reduced diffusivity. IMPRESSION: 1. Truncated examination due to patient altered mental status and inability to lie still during the study. 2. No acute ischemia. 3. Advanced atrophy for age. Electronically Signed: By: Deatra RobinsonKevin   Herman M.D. On: 12/24/2019 23:49    Pending Labs Unresulted Labs (From admission, onward)    Start     Ordered   12/22/19 0500  AM CBC  Tomorrow morning,   R     12/21/19 1504   12/22/19 0500  Comprehensive metabolic panel  Tomorrow morning,   R     12/21/19 1504   12/21/19 1431  Comprehensive metabolic panel  Once,   STAT     12/21/19 1431   12/21/19 1431  Magnesium  Once,   STAT     12/21/19 1431   12/21/19 1431  Phosphorus  Once,   STAT     12/21/19 1431   12/21/19 1431  CBC  Once,   STAT     12/21/19 1431   12/21/19 1352  Protime-INR  Once,   STAT     12/21/19 1351   12/21/19 0500  Basic metabolic panel  (amphotericin B lipsome (AMBISOME) )  Daily,   R     12/19/2019 2312   12/21/19 0500  Magnesium  (amphotericin B lipsome (AMBISOME) )  Daily,   R     12/09/2019 2312   12/05/2019 1909  Herpes simplex virus (HSV), DNA by PCR Cerebrospinal Fluid  (Meningitis Panel)  Once,   STAT     11/25/2019 1908   12/12/2019 1908  Cryptococcal antigen, CSF  Once,   STAT     11/27/2019 1908   12/05/2019 1907  CSF cell count with differential collection tube #: 1  (CSF Labs)  Once,   STAT    Question:  collection tube #  Answer:  1   12/01/2019 1908   11/25/2019 1907  CSF cell count with differential  (CSF Labs)  Once,   STAT     11/28/2019 1908   12/16/2019 1907  CSF culture  (CSF Labs)  Once,   STAT    Question Answer Comment  Are there also cytology or pathology orders on this specimen? No   Patient immune status Immunocompromised      12/10/2019 1908   12/16/2019 1907  Gram stain  (CSF Labs)  ONCE - STAT,   STAT    Question:  Patient immune status  Answer:  Immunocompromised   12/07/2019 1908   12/05/2019 1907  Protein and glucose, CSF  (CSF Labs)  Once,   STAT     12/01/2019 1908   12/15/2019 1907  Culture, fungus without smear  (CSF Labs)  Once,   STAT    Question:  Patient immune status  Answer:  Immunocompromised   12/17/2019 1908   12/11/2019 1907  Anaerobic culture  (CSF Labs)  Once,   STAT    Question:   Patient immune status  Answer:  Immunocompromised   12/10/2019 1908   12/19/2019 1904  HIV-1 RNA quant-no reflex-bld  Once,   STAT     12/19/2019 1904   12/18/2019 1903  T-helper cells (CD4) count (not at [facility])  Once,   STAT     11/30/2019 1904          Vitals/Pain Today's Vitals   12/21/19 1000 12/21/19 1200 12/21/19 1300 12/21/19 1315  BP: 126/89 116/62 112/61   Pulse:      Resp: (!) 25 (!) 22 20   Temp:      TempSrc:      SpO2:    100%  Weight:      Height:      PainSc:        Isolation Precautions No active isolations  Medications Medications  cefTRIAXone (ROCEPHIN) 2 g in sodium chloride 0.9 % 100 mL IVPB (0 g Intravenous Stopped 12/21/19 1315)  dexamethasone (DECADRON) injection 10 mg (10 mg Intravenous Given 12/21/19 1216)  acetaminophen (TYLENOL) tablet 650 mg (has no administration in time range)  diphenhydrAMINE (BENADRYL) injection 25 mg (has no administration in time range)    Or  diphenhydrAMINE (BENADRYL) capsule 25 mg (has no administration in time range)  meperidine (DEMEROL) injection 25 mg (has no administration in time range)  sodium chloride 0.9 % bolus 500 mL (500 mLs Intravenous Given 12/24/2019 2324)  dextrose 5% 10ml for flushing before and after ambisome (0 mLs Intravenous Stopped 12/21/19 1220)  amphotericin B liposome (AMBISOME) 270 mg in dextrose 5 % 500 mL IVPB (0 mg Intravenous Stopped 12/21/19 0626)  dextrose 5% 10ml for flushing before and after ambisome (0 mLs Intravenous Stopped 12/21/19 1220)  sodium chloride 0.9 % bolus 500 mL (500 mLs Intravenous Given 12/21/19 0235)  ampicillin (OMNIPEN) 2 g in sodium chloride 0.9 % 100 mL IVPB (0 g Intravenous Stopped 12/21/19 1523)  vancomycin (VANCOREADY) IVPB 1250 mg/250 mL (0 mg Intravenous Stopped 12/21/19 1523)  flucytosine (ANCOBON) capsule 2,250 mg (2,250 mg Oral Given 12/21/19 0948)  acyclovir (ZOVIRAX) 915 mg in dextrose 5 % 150 mL IVPB (0 mg/kg  91.6 kg Intravenous Stopped 12/21/19 1220)  rifaximin  (XIFAXAN) tablet 550 mg (550 mg Oral Given 12/21/19 1208)  nicotine (NICODERM CQ - dosed in mg/24 hours) patch 21 mg (21 mg Transdermal Patch Applied 12/21/19 1201)  lactulose (CHRONULAC) 10 GM/15ML solution 30 g (30 g Oral Given 12/21/19 1442)  magnesium oxide (MAG-OX) tablet 200 mg (200 mg Oral Given 12/21/19 1202)  enoxaparin (LOVENOX) injection 40 mg (40 mg Subcutaneous Refused 12/21/19 0127)  acetaminophen (TYLENOL) tablet 650 mg (has no administration in time range)    Or  acetaminophen (TYLENOL) suppository 650 mg (has no administration in time range)  levETIRAcetam (KEPPRA) IVPB 1000 mg/100 mL premix (0 mg Intravenous Stopped 12/21/19 1220)  dextrose 50 % solution (  Not Given 12/21/19 0234)  Darunavir-Cobicisctat-Emtricitabine-Tenofovir Alafenamide (SYMTUZA) 800-150-200-10 MG TABS 1 tablet (has no administration in time range)  dolutegravir (TIVICAY) tablet 50 mg (50 mg Oral Given 12/21/19 1441)  spironolactone (ALDACTONE) tablet 50 mg (has no administration in time range)  thiamine tablet 100 mg (has no administration in time range)    Or  thiamine (B-1) injection 100 mg (has no administration in time range)  folic acid (FOLVITE) tablet 1 mg (has no administration in time range)  multivitamin with minerals tablet 1 tablet (has no administration in time range)  magnesium sulfate IVPB 4 g 100 mL (has no administration in time range)  potassium chloride (KLOR-CON) CR tablet 30 mEq (has no administration in time range)  LORazepam (ATIVAN) 2 MG/ML injection (2 mg  Given 2019/12/31 1900)  levETIRAcetam (KEPPRA) IVPB 1000 mg/100 mL premix (0 mg Intravenous Stopped 31-Dec-2019 2129)  sodium chloride 0.9 % bolus 500 mL (0 mLs Intravenous Stopped 12/21/19 0042)  vancomycin (VANCOREADY) IVPB 2000 mg/400 mL (0 mg Intravenous Stopped 12/21/19 0521)  potassium chloride 10 mEq in 100 mL IVPB (0 mEq Intravenous Stopped 12/21/19 0537)  phytonadione (VITAMIN K) 2.5 mg in dextrose 5 % 50 mL IVPB (0 mg  Intravenous Stopped 12/21/19 0146)  sodium chloride 0.9 % bolus 1,000 mL (0 mLs Intravenous Stopped 12/21/19 0539)    Mobility walks Low fall risk   Focused Assessments Neuro Assessment Handoff:  Swallow screen pass? Yes          Neuro Assessment: Exceptions to WDL Neuro Checks:      Last Documented NIHSS Modified Score:   Has TPA been given? No If patient is a Neuro Trauma and patient is going to OR before floor call report to [place] nurse: [phone] or [phone]     R Recommendations: See Admitting Provider Note  Report given to:   Additional Notes:

## 2019-12-21 NOTE — ED Provider Notes (Signed)
Admitted team called ?[phone] to inquire as to whether LP was done in ED. Patient not known to me - previously was admitted and I has been boarding in ED since last night under care of the admitting service, while awaiting inpatient bed availability - on review of chart, it appears plan at time of admission was for correction of coagulopathy prior to LP. Last INR in chart appears unchanged from initial at 1.5, and platelet count low, 80. In addition to the admitting team, it appears ID and neurology have also been following patient and appropriately involved in care of patient. Order placed for IR LP to assist with inpatient's teams workup of patient. Dr Autumn Messing (earlier admitting EDP) now on shift and made aware of recent discussion with admitting team.    Lajean Saver, MD 12/21/19 [phone]

## 2019-12-22 ENCOUNTER — Inpatient Hospital Stay (HOSPITAL_COMMUNITY): Payer: Medicare Other

## 2019-12-22 ENCOUNTER — Inpatient Hospital Stay (HOSPITAL_COMMUNITY): Payer: Medicare Other | Admitting: Anesthesiology

## 2019-12-22 DIAGNOSIS — G40901 Epilepsy, unspecified, not intractable, with status epilepticus: Secondary | ICD-10-CM | POA: Diagnosis present

## 2019-12-22 DIAGNOSIS — Z9119 Patient's noncompliance with other medical treatment and regimen: Secondary | ICD-10-CM

## 2019-12-22 DIAGNOSIS — N182 Chronic kidney disease, stage 2 (mild): Secondary | ICD-10-CM

## 2019-12-22 DIAGNOSIS — F0390 Unspecified dementia without behavioral disturbance: Secondary | ICD-10-CM

## 2019-12-22 DIAGNOSIS — K769 Liver disease, unspecified: Secondary | ICD-10-CM

## 2019-12-22 LAB — POCT I-STAT 7, (LYTES, BLD GAS, ICA,H+H)
Acid-base deficit: 8 mmol/L — ABNORMAL HIGH (ref 0.0–2.0)
Bicarbonate: 16.9 mmol/L — ABNORMAL LOW (ref 20.0–28.0)
Calcium, Ion: 1.06 mmol/L — ABNORMAL LOW (ref 1.15–1.40)
HCT: 32 % — ABNORMAL LOW (ref 39.0–52.0)
Hemoglobin: 10.9 g/dL — ABNORMAL LOW (ref 13.0–17.0)
O2 Saturation: 100 %
Patient temperature: 97.5
Potassium: 3.3 mmol/L — ABNORMAL LOW (ref 3.5–5.1)
Sodium: 146 mmol/L — ABNORMAL HIGH (ref 135–145)
TCO2: 18 mmol/L — ABNORMAL LOW (ref 22–32)
pCO2 arterial: 31.5 mmHg — ABNORMAL LOW (ref 32.0–48.0)
pH, Arterial: 7.334 — ABNORMAL LOW (ref 7.350–7.450)
pO2, Arterial: 413 mmHg — ABNORMAL HIGH (ref 83.0–108.0)

## 2019-12-22 LAB — COMPREHENSIVE METABOLIC PANEL
ALT: 57 U/L — ABNORMAL HIGH (ref 0–44)
AST: 129 U/L — ABNORMAL HIGH (ref 15–41)
Albumin: 1.9 g/dL — ABNORMAL LOW (ref 3.5–5.0)
Alkaline Phosphatase: 93 U/L (ref 38–126)
Anion gap: 12 (ref 5–15)
BUN: 14 mg/dL (ref 6–20)
CO2: 16 mmol/L — ABNORMAL LOW (ref 22–32)
Calcium: 8 mg/dL — ABNORMAL LOW (ref 8.9–10.3)
Chloride: 109 mmol/L (ref 98–111)
Creatinine, Ser: 1.48 mg/dL — ABNORMAL HIGH (ref 0.61–1.24)
GFR calc Af Amer: >60 mL/min (ref >60)
GFR calc non Af Amer: >60 mL/min (ref >60)
Glucose, Bld: 135 mg/dL — ABNORMAL HIGH (ref 70–99)
Potassium: 4.3 mmol/L (ref 3.5–5.1)
Sodium: 137 mmol/L (ref 135–145)
Total Bilirubin: 7.2 mg/dL — ABNORMAL HIGH (ref 0.3–1.2)
Total Protein: 9.4 g/dL — ABNORMAL HIGH (ref 6.5–8.1)

## 2019-12-22 LAB — CBC
HCT: 34.6 % — ABNORMAL LOW (ref 39.0–52.0)
Hemoglobin: 12.1 g/dL — ABNORMAL LOW (ref 13.0–17.0)
MCH: 33.1 pg (ref 26.0–34.0)
MCHC: 35 g/dL (ref 30.0–36.0)
MCV: 94.5 fL (ref 80.0–100.0)
Platelets: 103 10*3/uL — ABNORMAL LOW (ref 150–400)
RBC: 3.66 MIL/uL — ABNORMAL LOW (ref 4.22–5.81)
RDW: 15.8 % — ABNORMAL HIGH (ref 11.5–15.5)
WBC: 12.8 10*3/uL — ABNORMAL HIGH (ref 4.0–10.5)
nRBC: 0.2 % (ref 0.0–0.2)

## 2019-12-22 LAB — PHENYTOIN LEVEL, TOTAL: Phenytoin Lvl: 13.4 ug/mL (ref 10.0–20.0)

## 2019-12-22 LAB — T-HELPER CELLS (CD4) COUNT (NOT AT ARMC)
CD4 % Helper T Cell: 12 % — ABNORMAL LOW (ref 33–65)
CD4 T Cell Abs: 89 /uL — ABNORMAL LOW (ref 400–1790)

## 2019-12-22 LAB — HIV-1 RNA QUANT-NO REFLEX-BLD
HIV 1 RNA Quant: 7020 copies/mL
LOG10 HIV-1 RNA: 3.846 log10copy/mL

## 2019-12-22 LAB — MAGNESIUM: Magnesium: 2.4 mg/dL (ref 1.7–2.4)

## 2019-12-22 MED ORDER — SODIUM CHLORIDE 0.9 % IV SOLN
1500.0000 mg | Freq: Once | INTRAVENOUS | Status: AC
  Administered 2019-12-22: 19:00:00 1500 mg via INTRAVENOUS
  Filled 2019-12-22: qty 30

## 2019-12-22 MED ORDER — MIDAZOLAM 50MG/50ML (1MG/ML) PREMIX INFUSION: 20.0000 mg/h | INTRAVENOUS | Status: DC

## 2019-12-22 MED ORDER — PHENYTOIN SODIUM 50 MG/ML IJ SOLN
100.0000 mg | Freq: Three times a day (TID) | INTRAMUSCULAR | Status: DC
  Administered 2019-12-23 – 2019-12-25 (×7): 100 mg via INTRAVENOUS
  Filled 2019-12-22 (×7): qty 2

## 2019-12-22 MED ORDER — SUCCINYLCHOLINE CHLORIDE 20 MG/ML IJ SOLN
INTRAMUSCULAR | Status: DC | PRN
  Administered 2019-12-22: 100 mg via INTRAVENOUS

## 2019-12-22 MED ORDER — ADULT MULTIVITAMIN LIQUID CH
15.0000 mL | Freq: Every day | ORAL | Status: DC
  Administered 2019-12-23 – 2019-12-25 (×3): 15 mL
  Filled 2019-12-22 (×3): qty 15

## 2019-12-22 MED ORDER — SODIUM CHLORIDE 0.9 % IV SOLN
50.0000 mg/h | INTRAVENOUS | Status: DC
  Administered 2019-12-23: 02:00:00 50 mg/h via INTRAVENOUS
  Filled 2019-12-22: qty 50

## 2019-12-22 MED ORDER — LORAZEPAM BOLUS VIA INFUSION: 2.0000 mg | Freq: Once | INTRAVENOUS | Status: DC

## 2019-12-22 MED ORDER — LORAZEPAM 2 MG/ML IJ SOLN
1.0000 mg | Freq: Once | INTRAMUSCULAR | Status: AC
  Administered 2019-12-22: 01:00:00 1 mg via INTRAVENOUS
  Filled 2019-12-22: qty 1

## 2019-12-22 MED ORDER — LEVETIRACETAM IN NACL 1500 MG/100ML IV SOLN
1500.0000 mg | Freq: Two times a day (BID) | INTRAVENOUS | Status: DC
  Administered 2019-12-23 – 2019-12-25 (×5): 1500 mg via INTRAVENOUS
  Filled 2019-12-22 (×5): qty 100

## 2019-12-22 MED ORDER — WHITE PETROLATUM EX OINT
TOPICAL_OINTMENT | CUTANEOUS | Status: AC
  Administered 2019-12-22: 0.2
  Filled 2019-12-22: qty 28.35

## 2019-12-22 MED ORDER — PROPOFOL 1000 MG/100ML IV EMUL
INTRAVENOUS | Status: AC
  Administered 2019-12-22: 10 ug/kg/min via INTRAVENOUS
  Filled 2019-12-22: qty 100

## 2019-12-22 MED ORDER — ORAL CARE MOUTH RINSE
15.0000 mL | OROMUCOSAL | Status: DC
  Administered 2019-12-22 – 2019-12-25 (×26): 15 mL via OROMUCOSAL

## 2019-12-22 MED ORDER — LORAZEPAM 2 MG/ML IJ SOLN: 2.0000 mg | Freq: Once | INTRAMUSCULAR | Status: DC

## 2019-12-22 MED ORDER — SULFAMETHOXAZOLE-TRIMETHOPRIM 200-40 MG/5ML PO SUSP
10.0000 mL | Freq: Every day | ORAL | Status: DC
  Administered 2019-12-23 – 2019-12-25 (×3): 10 mL
  Filled 2019-12-22 (×3): qty 10

## 2019-12-22 MED ORDER — SODIUM CHLORIDE 0.9 % IV SOLN
30.0000 mg/h | INTRAVENOUS | Status: DC
  Administered 2019-12-22: 50 mg/h via INTRAVENOUS
  Filled 2019-12-22: qty 20

## 2019-12-22 MED ORDER — MIDAZOLAM 50MG/50ML (1MG/ML) PREMIX INFUSION
30.0000 mg/h | INTRAVENOUS | Status: DC
  Administered 2019-12-22: 22:00:00 10 mg/h via INTRAVENOUS
  Filled 2019-12-22: qty 50

## 2019-12-22 MED ORDER — SODIUM CHLORIDE 0.9 % IV SOLN: 75.0000 mL/h | INTRAVENOUS | Status: DC

## 2019-12-22 MED ORDER — LEVETIRACETAM IN NACL 1000 MG/100ML IV SOLN
1000.0000 mg | INTRAVENOUS | Status: AC
  Administered 2019-12-22: 1000 mg via INTRAVENOUS
  Filled 2019-12-22 (×2): qty 100

## 2019-12-22 MED ORDER — LORAZEPAM 2 MG/ML IJ SOLN
2.0000 mg | Freq: Once | INTRAMUSCULAR | Status: DC
  Filled 2019-12-22: qty 1

## 2019-12-22 MED ORDER — CHLORHEXIDINE GLUCONATE 0.12% ORAL RINSE (MEDLINE KIT)
15.0000 mL | Freq: Two times a day (BID) | OROMUCOSAL | Status: DC
  Administered 2019-12-22 – 2019-12-25 (×6): 15 mL via OROMUCOSAL

## 2019-12-22 MED ORDER — MIDAZOLAM BOLUS VIA INFUSION
10.0000 mg | Freq: Once | INTRAVENOUS | Status: AC
  Administered 2019-12-22: 22:00:00 10 mg via INTRAVENOUS

## 2019-12-22 MED ORDER — LORAZEPAM 2 MG/ML IJ SOLN
INTRAMUSCULAR | Status: AC
  Administered 2019-12-22: 2 mg via INTRAVENOUS
  Filled 2019-12-22: qty 1

## 2019-12-22 MED ORDER — CHLORHEXIDINE GLUCONATE CLOTH 2 % EX PADS
6.0000 | MEDICATED_PAD | Freq: Every day | CUTANEOUS | Status: DC
  Administered 2019-12-23 – 2019-12-25 (×3): 6 via TOPICAL

## 2019-12-22 MED ORDER — SULFAMETHOXAZOLE-TRIMETHOPRIM 400-80 MG PO TABS
1.0000 | ORAL_TABLET | Freq: Every day | ORAL | Status: DC
  Administered 2019-12-22: 1 via ORAL
  Filled 2019-12-22: qty 1

## 2019-12-22 MED ORDER — LORAZEPAM 2 MG/ML IJ SOLN
1.0000 mg | INTRAMUSCULAR | Status: DC | PRN
  Administered 2019-12-22 – 2019-12-25 (×4): 2 mg via INTRAVENOUS
  Filled 2019-12-22 (×4): qty 1

## 2019-12-22 MED ORDER — PROPOFOL 1000 MG/100ML IV EMUL
5.0000 ug/kg/min | INTRAVENOUS | Status: DC
  Administered 2019-12-23: 16:00:00 130 ug/kg/min via INTRAVENOUS
  Administered 2019-12-23 (×3): 160 ug/kg/min via INTRAVENOUS
  Administered 2019-12-23: 01:00:00 120 ug/kg/min via INTRAVENOUS
  Administered 2019-12-23 (×2): 160 ug/kg/min via INTRAVENOUS
  Administered 2019-12-23: 15:00:00 145 ug/kg/min via INTRAVENOUS
  Administered 2019-12-23 (×2): 160 ug/kg/min via INTRAVENOUS
  Administered 2019-12-23: 155 ug/kg/min via INTRAVENOUS
  Administered 2019-12-24: 05:00:00 5 ug/kg/min via INTRAVENOUS
  Filled 2019-12-22: qty 200
  Filled 2019-12-22: qty 400
  Filled 2019-12-22 (×2): qty 100
  Filled 2019-12-22 (×2): qty 200
  Filled 2019-12-22: qty 500

## 2019-12-22 MED ORDER — ENOXAPARIN SODIUM 40 MG/0.4ML ~~LOC~~ SOLN
40.0000 mg | SUBCUTANEOUS | Status: DC
  Administered 2019-12-23: 01:00:00 40 mg via SUBCUTANEOUS
  Filled 2019-12-22: qty 0.4

## 2019-12-22 MED ORDER — PHENYLEPHRINE HCL-NACL 10-0.9 MG/250ML-% IV SOLN
INTRAVENOUS | Status: AC
  Administered 2019-12-23: 30 ug/min via INTRAVENOUS
  Filled 2019-12-22: qty 250

## 2019-12-22 MED ORDER — PROPOFOL 10 MG/ML IV BOLUS
INTRAVENOUS | Status: DC | PRN
  Administered 2019-12-22: 120 mg via INTRAVENOUS

## 2019-12-22 NOTE — Progress Notes (Signed)
Had to call MD for order for sitter, as has grown increasingly more agitated.  Has pulled out IVs, attempted to climb out of the bed, has said he has to go home and pick someone up, and is constantly attempting to get up. He has been incont of bowel and then took his hands to attempt to clean it up and had feces on his hands and face.  He was assisted out of the bed but is very unsteady and was very difficult to assist and redirect with 3 people helping him.  He was then given the bedpan and kept removing it.  No measures to try to calm him have worked.  He is confused and restless.  All fall risk interventions are intact so far, and did order a low bed for him. Just waiting for it, as there are none available at this time so we had one of our techs to sit with him for safety.  He has 2 IV access sites because he has hourly IV medications and this is needed to ensure he gets his medications.  We did place on mittens because he pulled out 1 IV and is attempting to pull out other IVs and attempting to remove his condom cath.  His CIWA went from 4-9 and he had no Ativan ordered so I called the MD to see if he can get anything and she ordered a 1 time dose of Ativan.

## 2019-12-22 NOTE — Progress Notes (Signed)
The patient is now in the ICU, 4N26 and has been intubated. Succinylcholine and propofol were used for paralytic and sedation, respectively. Intubated at 9:38 PM.   Reassessed at 9:56 PM with continued electrographic seizure discharges in left sided leads, but with twitching bilaterally, more prominent on the left. The twitching is of significantly lower amplitude than prior to intubation, but visibly and gradually increases in intensity over a 2 minute period of close observation at the bedside.   Versed 10 mg IV bolused followed by a rate of 10 mg/hr. The twitching significantly decreased in amplitude with cessation of the high amplitude left sided electrographic seizure discharged on EEG. However, still with low amplitude rhythmic discharges on EEG.   Additional 10 mg IV Versed bolused followed by increased rate to 20 mg/hr.   10:25 PM: Facial twitching has stopped, but still with low amplitude rhythmic neck movements as well as still twitching slightly in left quadriceps. EEG with low amplitude approximately   10:26 PM: Additional 10 mg IV Versed being bolused (total bolused is now 30) followed by increased rate to 30 mg/hr.   10:55 PM: Subtle low amplitude rhythmic slowing on EEG, with correlating subtle twitching of left medial thigh. Additional 10 mg Versed bolused with increase of rate to 40.  60 additional minutes of ICU team spent at the bedside titrating sedation towards seizure control in this critically ill patient.   Electronically signed: Dr. Kerney Elbe

## 2019-12-22 NOTE — Progress Notes (Addendum)
Called to assess patient due to twitching of extremities and abnormal vitals. HR 125 and RR 30.   BP 137/71 (BP Location: Right Arm)   Pulse (!) 128   Temp 98.7 F (37.1 C) (Oral)   Resp (!) 30   Ht 6\' 1"  (1.854 m)   Wt 91.6 kg   SpO2 97%   BMI 26.65 kg/m   Exam:  Ment: Not responsive to external stimuli. No eye opening to voice or stimulation.  CN: Eyes without forced gaze deviation. Face symmetric.  Motor: Decreased tone x 4 with the exception of continuous twitching of LLE.   EEG (7:39 PM):  Abnormalities:  - Continuous slow, generalized - Seizure, right posterior quadrant Impression: This study showed one seizure arising from right posterior quadrant during which patient was noted to have left arm and leg jerking, lasting over 2 minutes ( study ended while seizure was ongoing). There is also evidence of severe diffuse encephalopathy, non specific to etiology. Of note, per history patient has been having frequent left sided jerking for prolonged period and is not back to baseline and therefore meets criteria for status epilepticus.   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 1. Was loaded with both fosphenytoin and Keppra between 7 and 8 PM without cessation of seizure activity.  2. Will need to be intubated STAT followed by initiation of burst suppression with Versed and/or propofol.  3. LTM EEG is running. Will use to guide titration of sedation for burst suppression.  4. I have discussed the case with CCM. Floor team is being sent to assess the patient for intubation and transfer to the ICU. CCM has been asked to call me after intubation.  5. 2 mg IV Ativan administered.   40 minutes spent in the emergent Neurological evaluation and management of this critically ill patient. Time spent included coordination of care and EEG review.   Addendum:  --Patient with worsened left sided  seizure activity, now involving face and arm as well as leg, also with higher amplitude. Is biting tongue. The activity is rhythmic and synchronous with approximately 1 Hz polyphasic sharp wave discharges in all leads. Additional 2 mg Ativan given with no effect. 3 rd dose of IV Ativan 2 mg ordered and administered; still with no change to clinical or electrographic seizure activity.  --Being emergently transferred to 4N28. Currently in transport as of 9:15 PM.   Additional 20 minutes of critical care time.   Electronically signed: Dr. Kerney Elbe

## 2019-12-22 NOTE — Progress Notes (Signed)
Neurology Progress Note   S:// Called for sz activity by the primary team. Has been confused all day. Has been having a seizure for the last 30 min, which involves the right hemibody. Awaiting spinal tap once 24 hours post Lovenox.  O:// Current vital signs: BP (!) 115/48 (BP Location: Right Arm)   Pulse (!) 125   Temp 98 F (36.7 C) (Oral)   Resp 18   Ht 6\' 1"  (1.854 m)   Wt 91.6 kg   SpO2 98%   BMI 26.65 kg/m  Vital signs in last 24 hours: Temp:  [97.3 F (36.3 C)-98 F (36.7 C)] 98 F (36.7 C) (12/28 1241) Pulse Rate:  [98-125] 125 (12/28 1241) Resp:  [16-18] 18 (12/28 1241) BP: (107-119)/(48-72) 115/48 (12/28 1241) SpO2:  [98 %-100 %] 98 % (12/28 1241) General: Unresponsive with right hemibody jerking. HEENT: Normocephalic, atraumatic.  Dried blood around the mouth. CVS: Tachycardic Respiratory: Breathing well saturating normally on room air Extremities: Warm well perfused Neurological exam Unresponsive obtunded, with a rightward gaze and right upper and lower extremity tonic-clonic jerking. Does not follow any commands Cranial nerves: Pupils equal round reactive light, rightward gaze deviation, that resolved with Ativan 2 mg, symmetric face. Motor exam: Spontaneously jerking right upper and lower extremity.  No response to pain. Sensory exam: As above     Medications  Current Facility-Administered Medications:  .  0.9 %  sodium chloride infusion, 75 mL/hr, Intravenous, Continuous, Bloomfield, Carley D, DO .  acetaminophen (TYLENOL) tablet 650 mg, 650 mg, Oral, Q6H PRN, 650 mg at 12/21/19 1619 **OR** acetaminophen (TYLENOL) suppository 650 mg, 650 mg, Rectal, Q6H PRN, Asencion Noble, MD .  acetaminophen (TYLENOL) tablet 650 mg, 650 mg, Oral, Daily PRN, Sherry Ruffing, Marissa M, MD .  acyclovir (ZOVIRAX) 915 mg in dextrose 5 % 150 mL IVPB, 10 mg/kg, Intravenous, Q8H, Asencion Noble, MD, Last Rate: 168.3 mL/hr at 12/22/19 1113, 915 mg at 12/22/19 1113 .   amphotericin B liposome (AMBISOME) 270 mg in dextrose 5 % 500 mL IVPB, 3 mg/kg, Intravenous, Q24H, Asencion Noble, MD, Last Rate: 283.8 mL/hr at 12/22/19 0131, 270 mg at 12/22/19 0131 .  ampicillin (OMNIPEN) 2 g in sodium chloride 0.9 % 100 mL IVPB, 2 g, Intravenous, Q4H, Asencion Noble, MD, Last Rate: 300 mL/hr at 12/22/19 1643, 2 g at 12/22/19 1643 .  cefTRIAXone (ROCEPHIN) 2 g in sodium chloride 0.9 % 100 mL IVPB, 2 g, Intravenous, Q12H, Asencion Noble, MD, Last Rate: 200 mL/hr at 12/22/19 1221, 2 g at 12/22/19 1221 .  dexamethasone (DECADRON) injection 10 mg, 10 mg, Intravenous, Q6H, Asencion Noble, MD, 10 mg at 12/22/19 1813 .  dextrose 5% 35ml for flushing before and after ambisome, 10 mL, Intravenous, Q24H, Asencion Noble, MD, Stopped at 12/21/19 1220 .  dextrose 5% 79ml for flushing before and after ambisome, 10 mL, Intravenous, Q24H, Asencion Noble, MD, Last Rate: 250 mL/hr at 12/22/19 0127, 10 mL at 12/22/19 0455 .  diphenhydrAMINE (BENADRYL) injection 25 mg, 25 mg, Intravenous, Daily PRN, 25 mg at 12/22/19 0855 **OR** diphenhydrAMINE (BENADRYL) capsule 25 mg, 25 mg, Oral, Daily PRN, Asencion Noble, MD, 25 mg at 12/22/19 0022 .  flucytosine (ANCOBON) capsule 2,250 mg, 25 mg/kg, Oral, Q6H, Asencion Noble, MD, 2,250 mg at 12/22/19 1406 .  folic acid (FOLVITE) tablet 1 mg, 1 mg, Oral, Daily, Masoudi, Elhamalsadat, MD, 1 mg at 12/22/19 0848 .  lactulose (CHRONULAC) 10 GM/15ML solution 30 g, 30 g,  Oral, TID, Claudean Severance, MD, 30 g at 12/22/19 1644 .  levETIRAcetam (KEPPRA) IVPB 1000 mg/100 mL premix, 1,000 mg, Intravenous, Q12H, Claudean Severance, MD, Last Rate: 400 mL/hr at 12/22/19 0845, 1,000 mg at 12/22/19 0845 .  LORazepam (ATIVAN) injection 1-2 mg, 1-2 mg, Intravenous, Q2H PRN, Bloomfield, Carley D, DO, 2 mg at 12/22/19 1813 .  magnesium oxide (MAG-OX) tablet 200 mg, 200 mg, Oral, Daily, Gwyneth Revels, Marissa M, MD, 200 mg at 12/22/19 0849 .  meperidine  (DEMEROL) injection 25 mg, 25 mg, Intravenous, Q15 min PRN, Gwyneth Revels, Marissa M, MD .  multivitamin with minerals tablet 1 tablet, 1 tablet, Oral, Daily, Masoudi, Elhamalsadat, MD, 1 tablet at 12/22/19 1255 .  nicotine (NICODERM CQ - dosed in mg/24 hours) patch 21 mg, 21 mg, Transdermal, Daily, Claudean Severance, MD, 21 mg at 12/22/19 0847 .  rifaximin (XIFAXAN) tablet 550 mg, 550 mg, Oral, BID, Claudean Severance, MD, 550 mg at 12/22/19 0848 .  sodium chloride 0.9 % bolus 500 mL, 500 mL, Intravenous, Q24H, Claudean Severance, MD, 500 mL at 12/21/19 2318 .  sodium chloride 0.9 % bolus 500 mL, 500 mL, Intravenous, Q24H, Gwyneth Revels, Marissa M, MD, 500 mL at 12/21/19 2317 .  spironolactone (ALDACTONE) tablet 50 mg, 50 mg, Oral, BID, Christian, Rylee, MD, 50 mg at 12/22/19 0848 .  sulfamethoxazole-trimethoprim (BACTRIM) 400-80 MG per tablet 1 tablet, 1 tablet, Oral, Daily, Inez Catalina, MD, 1 tablet at 12/22/19 1256 .  thiamine tablet 100 mg, 100 mg, Oral, Daily, 100 mg at 12/22/19 0848 **OR** thiamine (B-1) injection 100 mg, 100 mg, Intravenous, Daily, Masoudi, Elhamalsadat, MD .  vancomycin (VANCOREADY) IVPB 1250 mg/250 mL, 1,250 mg, Intravenous, Q12H, Claudean Severance, MD, Last Rate: 166.7 mL/hr at 12/22/19 1247, 1,250 mg at 12/22/19 1247   Labs CBC    Component Value Date/Time   WBC 12.8 (H) 12/22/2019 0729   RBC 3.66 (L) 12/22/2019 0729   HGB 12.1 (L) 12/22/2019 0729   HGB 10.5 (L) 09/08/2019 1652   HCT 34.6 (L) 12/22/2019 0729   HCT 27.7 (L) 09/08/2019 1652   PLT 103 (L) 12/22/2019 0729   PLT 83 (LL) 09/08/2019 1652   MCV 94.5 12/22/2019 0729   MCV 87 09/08/2019 1652   MCH 33.1 12/22/2019 0729   MCHC 35.0 12/22/2019 0729   RDW 15.8 (H) 12/22/2019 0729   RDW 13.2 09/08/2019 1652   LYMPHSABS 1.3 01/01/2020 1516   LYMPHSABS 1.6 09/08/2019 1652   MONOABS 0.3 01-01-2020 1516   EOSABS 0.0 01-01-2020 1516   EOSABS 0.1 09/08/2019 1652   BASOSABS 0.0 01-01-20 1516   BASOSABS 0.1  09/08/2019 1652    CMP     Component Value Date/Time   NA 137 12/22/2019 0729   NA 138 09/08/2019 1652   K 4.3 12/22/2019 0729   CL 109 12/22/2019 0729   CO2 16 (L) 12/22/2019 0729   GLUCOSE 135 (H) 12/22/2019 0729   BUN 14 12/22/2019 0729   BUN 7 09/08/2019 1652   CREATININE 1.48 (H) 12/22/2019 0729   CREATININE 1.20 06/25/2019 1133   CALCIUM 8.0 (L) 12/22/2019 0729   PROT 9.4 (H) 12/22/2019 0729   PROT 8.8 (H) 09/08/2019 1652   ALBUMIN 1.9 (L) 12/22/2019 0729   ALBUMIN 2.5 (L) 09/08/2019 1652   AST 129 (H) 12/22/2019 0729   ALT 57 (H) 12/22/2019 0729   ALT 57 (H) 02/24/2019 1632   ALKPHOS 93 12/22/2019 0729   BILITOT 7.2 (H) 12/22/2019 0729   BILITOT  6.5 (H) 09/08/2019 1652   GFRNONAA >60 12/22/2019 0729   GFRNONAA 82 06/25/2019 1133   GFRAA >60 12/22/2019 0729   GFRAA 95 06/25/2019 1133    Imaging I have reviewed images in epic and the results pertinent to this consultation are: MRI brain 12/21/2019 with increasing size of the lateral and third ventricles compared to 01/08/2019.  Truncated exam due to altered mental status  Assessment: 28 year old, past history of HIV medication noncompliance, seizures, medication noncompliance, presented for persistent headache. Currently pending LP under fluoroscopy. Has a history of cryptococcal meningitis along with noncompliant medications.  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 Currently, seizing for upwards of 30 minutes.  Status epilepticus. Resolved with 2 mg of Ativan clinically.  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  -We will increase the dose of Keppra to 1500 twice daily.  -Check phenytoin level in 2 hours.  Continue phenytoin 100 3 times daily.  -Stat EEG followed by  LTM EEG overnight  -CT head in the morning.  LP under IR in the morning.  -Discussed with critical care.  Seizure activity abated with benzos.  Currently protecting his airway.  -We will have low threshold for intubation if continues to seize again.  Neurology will continue to follow the EEG with you.  Check ammonia levels  Please call with any concern for seizure activity.  Please use Ativan for any seizure lasting greater than 5 minutes and also call neurology at the time.  Case was discussed curbside over the phone with PCCM.  Findings relayed to the primary team resident physician on call.   -- Milon DikesAshish Tifani Dack, MD [facility] Pager: [phone] If 7pm to 7am, please call on call as listed on AMION.   CRITICAL CARE ATTESTATION Performed by: Milon DikesAshish Duglas Heier, MD Total critical care time: 55 minutes Critical care time was exclusive of separately billable procedures and treating other patients and/or supervising APPs/Residents/Students Critical care was necessary to treat or prevent imminent or life-threatening deterioration due to status epilepticus, toxic metabolic encephalopathy This patient is critically ill and at significant risk for neurological worsening and/or death and care requires constant monitoring. Critical care was time spent personally by me on the following activities: development of treatment plan with patient and/or surrogate as well as nursing, discussions with consultants, evaluation of patient's response to treatment, examination of patient, obtaining history from patient or surrogate, ordering and performing treatments and interventions, ordering and review of laboratory studies, ordering and review of radiographic studies, pulse oximetry, re-evaluation of patient's condition, participation in multidisciplinary rounds and medical decision making of high complexity in the care of this patient.

## 2019-12-22 NOTE — Anesthesia Procedure Notes (Signed)
Procedure Name: Intubation Date/Time: 12/22/2019 9:38 PM Performed by: Valetta Fuller, CRNA Pre-anesthesia Checklist: Patient identified, Emergency Drugs available, Suction available and Patient being monitored Patient Re-evaluated:Patient Re-evaluated prior to induction Oxygen Delivery Method: Circle system utilized Preoxygenation: Pre-oxygenation with 100% oxygen Induction Type: IV induction, Rapid sequence and Cricoid Pressure applied Laryngoscope Size: Glidescope Grade View: Grade I Tube type: Oral Tube size: 8.0 mm Number of attempts: 2 Airway Equipment and Method: Stylet Placement Confirmation: ETT inserted through vocal cords under direct vision,  breath sounds checked- equal and bilateral and CO2 detector Secured at: 26 cm Tube secured with: RT securement device. Dental Injury: Teeth and Oropharynx as per pre-operative assessment  Comments: Patient noted to have small amount of blood in mouth prior to instrumentation. DL with Sabra Heck 2; too short. DL with Glidescope, Gr I view, intubation without difficulty

## 2019-12-22 NOTE — Progress Notes (Signed)
Reassessed from 11:40 - 11:50 PM. Still with clinical and electrographic seizure activity during and immediately after propofol bolus of 100 mg. NS bolus completed. Neosynephrine titratable gtt now running with SBP further improved to 110.  Versed continues at a rate of 50.    Propofol gtt increased from 10 to 40 at 11: 50 PM.   Reassessed at 11:54 PM. Still with clinical and electrographic seizure activity. Will bolus with an additional 100 mg propofol now (for a total of 200 mg bolused) and increase propofol gtt to 80.   15 additional minutes of ICU time.

## 2019-12-22 NOTE — Progress Notes (Signed)
Dr. Cheral Marker informed of pt's HR 128 and RR 30

## 2019-12-22 NOTE — Progress Notes (Addendum)
Paged to bedside at 17:36 due to concern for seizure-like activity. On arrival patient had unilateral gaze to right with right-sided tonic clonic jerking movements. This had reportedly already occurred at least two other times, but it was unclear how long episodes lasted.   Stat EEG due to concern for status epilepticus and prn Ativan was ordered.   Dr. Carloyn Jaeger with neurology was notified who promptly evaluated patient and noted patient to be in status. He was loaded with additional keppra and fosphenytoin with resolution of seizure activity. Critical care was called, but no indication for intubation at this time given seizure resolution.   Greatly appreciate neurology's urgent evaluation and recommendations.    Modena Nunnery D, DO PGY-2 12/22/19, 6:37 PM

## 2019-12-22 NOTE — Progress Notes (Signed)
Patient reassessed. Now with recurrence of jerking of face and all 4 limbs while on Versed at a rate of 40 mg/hr. Also with worsened EEG, with recurrence of high amplitude rhythmic epileptiform discharges.   Bolusing an additional 10 mg Versed followed by increasing rate to 50. BP now soft so bolusing 1 L IV NS. CCM is being called to consider initiating a pressor.   Also starting propofol at a rate of 10 mcg/kg/min.   10 additional minutes of ICU time.   Electronically signed: Dr. Kerney Elbe

## 2019-12-22 NOTE — Progress Notes (Signed)
At 2000 Dr. Cheral Marker called to inform RN of pt having active seizure via EEG. V.O. to give prn Ativan 2mg .

## 2019-12-22 NOTE — Progress Notes (Addendum)
INFECTIOUS DISEASE PROGRESS NOTE  ID: Justin Ball is a 28 y.o. male with  Active Problems:   Nonintractable headache   Seizure (HCC)   AMS (altered mental status)  Subjective: Noted continued difficulty with confusion.  Currently sleeping.   Abtx:  Anti-infectives (From admission, onward)   Start     Dose/Rate Route Frequency Ordered Stop   12/22/19 0800  Darunavir-Cobicisctat-Emtricitabine-Tenofovir Alafenamide (SYMTUZA) 800-150-200-10 MG TABS 1 tablet     1 tablet Oral Daily with breakfast 12/21/19 1222     12/21/19 1300  dolutegravir (TIVICAY) tablet 50 mg     50 mg Oral Daily 12/21/19 1248     12/21/19 1200  vancomycin (VANCOREADY) IVPB 1250 mg/250 mL     1,250 mg 166.7 mL/hr over 90 Minutes Intravenous Every 12 hours 12/19/2019 2312     12/21/19 0100  amphotericin B liposome (AMBISOME) 270 mg in dextrose 5 % 500 mL IVPB     3 mg/kg  91.6 kg 283.8 mL/hr over 120 Minutes Intravenous Every 24 hours 12/12/2019 2312     12/21/19 0100  rifaximin (XIFAXAN) tablet 550 mg     550 mg Oral 2 times daily 12/21/19 0057     12/21/19 0045  acyclovir (ZOVIRAX) 915 mg in dextrose 5 % 150 mL IVPB     10 mg/kg  91.6 kg 168.3 mL/hr over 60 Minutes Intravenous Every 8 hours 12/21/19 0035     12/21/19 0000  ampicillin (OMNIPEN) 2 g in sodium chloride 0.9 % 100 mL IVPB     2 g 300 mL/hr over 20 Minutes Intravenous Every 4 hours 12/16/2019 2312     12/21/19 0000  flucytosine (ANCOBON) capsule 2,250 mg  Status:  Discontinued     25 mg/kg  91.6 kg Oral Every 6 hours 12/02/2019 2312 12/17/2019 2327   12/21/19 0000  vancomycin (VANCOREADY) IVPB 2000 mg/400 mL     2,000 mg 200 mL/hr over 120 Minutes Intravenous  Once 12/13/2019 2312 12/21/19 0521   12/21/19 0000  flucytosine (ANCOBON) capsule 2,250 mg     25 mg/kg  91.6 kg Oral Every 6 hours 12/17/2019 2327     12/24/2019 2215  cefTRIAXone (ROCEPHIN) 2 g in sodium chloride 0.9 % 100 mL IVPB     2 g 200 mL/hr over 30 Minutes Intravenous Every 12 hours  12/01/2019 2214        Medications:  Scheduled: . Darunavir-Cobicisctat-Emtricitabine-Tenofovir Alafenamide  1 tablet Oral Q breakfast  . dexamethasone (DECADRON) injection  10 mg Intravenous Q6H  . dolutegravir  50 mg Oral Daily  . flucytosine  25 mg/kg Oral Q6H  . folic acid  1 mg Oral Daily  . lactulose  30 g Oral TID  . magnesium oxide  200 mg Oral Daily  . multivitamin with minerals  1 tablet Oral Daily  . nicotine  21 mg Transdermal Daily  . rifaximin  550 mg Oral BID  . sodium chloride  500 mL Intravenous Q24H  . sodium chloride  500 mL Intravenous Q24H  . spironolactone  50 mg Oral BID  . thiamine  100 mg Oral Daily   Or  . thiamine  100 mg Intravenous Daily    Objective: Vital signs in last 24 hours: Temp:  [97.3 F (36.3 C)-98 F (36.7 C)] 98 F (36.7 C) (12/28 0835) Pulse Rate:  [98-112] 108 (12/28 0835) Resp:  [16-25] 18 (12/28 0835) BP: (107-126)/(50-89) 119/59 (12/28 0835) SpO2:  [100 %] 100 % (12/28 0835)   General appearance: no  distress Resp: clear to auscultation bilaterally Cardio: regular rate and rhythm GI: normal findings: bowel sounds normal and soft, non-tender  Lab Results Recent Labs    12/21/19 0533 12/21/19 1606 12/22/19 0729  WBC 5.1 7.8  --   HGB 11.9* 11.6*  --   HCT 33.6* 32.7*  --   NA 137 137 137  K 3.2* 3.3* 4.3  CL 110 111 109  CO2 18* 17* 16*  BUN 11 12 14   CREATININE 1.34* 1.46* 1.48*   Liver Panel Recent Labs    12/18/2019 1947 12/21/19 1606 12/22/19 0729  PROT 9.8* 9.4* 9.4*  ALBUMIN 2.0* 1.9* 1.9*  AST 148* 109* 129*  ALT 54* 49* 57*  ALKPHOS 106 93 93  BILITOT 9.2* 8.0* 7.2*  BILIDIR 4.6*  --   --   IBILI 4.6*  --   --    Sedimentation Rate No results for input(s): ESRSEDRATE in the last 72 hours. C-Reactive Protein No results for input(s): CRP in the last 72 hours.  Microbiology: Recent Results (from the past 240 hour(s))  Respiratory Panel by RT PCR (Flu A&B, Covid) - Nasopharyngeal Swab     Status:  None   Collection Time: 12/06/2019  2:40 AM   Specimen: Nasopharyngeal Swab  Result Value Ref Range Status   SARS Coronavirus 2 by RT PCR NEGATIVE NEGATIVE Final    Comment: (NOTE) SARS-CoV-2 target nucleic acids are NOT DETECTED. The SARS-CoV-2 RNA is generally detectable in upper respiratoy specimens during the acute phase of infection. The lowest concentration of SARS-CoV-2 viral copies this assay can detect is 131 copies/mL. A negative result does not preclude SARS-Cov-2 infection and should not be used as the sole basis for treatment or other patient management decisions. A negative result may occur with  improper specimen collection/handling, submission of specimen other than nasopharyngeal swab, presence of viral mutation(s) within the areas targeted by this assay, and inadequate number of viral copies (<131 copies/mL). A negative result must be combined with clinical observations, patient history, and epidemiological information. The expected result is Negative. Fact Sheet for Patients:  PinkCheek.be Fact Sheet for Healthcare Providers:  GravelBags.it This test is not yet ap proved or cleared by the [place] FDA and  has been authorized for detection and/or diagnosis of SARS-CoV-2 by FDA under an Emergency Use Authorization (EUA). This EUA will remain  in effect (meaning this test can be used) for the duration of the COVID-19 declaration under Section 564(b)(1) of the Act, 21 U.S.C. section 360bbb-3(b)(1), unless the authorization is terminated or revoked sooner.    Influenza A by PCR NEGATIVE NEGATIVE Final   Influenza B by PCR NEGATIVE NEGATIVE Final    Comment: (NOTE) The Xpert Xpress SARS-CoV-2/FLU/RSV assay is intended as an aid in  the diagnosis of influenza from Nasopharyngeal swab specimens and  should not be used as a sole basis for treatment. Nasal washings and  aspirates are unacceptable for Xpert  Xpress SARS-CoV-2/FLU/RSV  testing. Fact Sheet for Patients: PinkCheek.be Fact Sheet for Healthcare Providers: GravelBags.it This test is not yet approved or cleared by the [place] FDA and  has been authorized for detection and/or diagnosis of SARS-CoV-2 by  FDA under an Emergency Use Authorization (EUA). This EUA will remain  in effect (meaning this test can be used) for the duration of the  Covid-19 declaration under Section 564(b)(1) of the Act, 21  U.S.C. section 360bbb-3(b)(1), unless the authorization is  terminated or revoked. Performed at [facility], [place]  [address] [place], [place] [address]   Blood culture (routine x 2)     Status: None (Preliminary result)   Collection Time: 2019-04-22  7:47 PM   Specimen: BLOOD  Result Value Ref Range Status   Specimen Description BLOOD LEFT ANTECUBITAL  Final   Special Requests   Final    BOTTLES DRAWN AEROBIC AND ANAEROBIC Blood Culture results may not be optimal due to an excessive volume of blood received in culture bottles   Culture   Final    NO GROWTH < 12 HOURS Performed at [facility], [address] [place], [place] [address]    Report Status PENDING  Incomplete  Blood culture (routine x 2)     Status: None (Preliminary result)   Collection Time: 2019-04-22  7:59 PM   Specimen: BLOOD RIGHT HAND  Result Value Ref Range Status   Specimen Description BLOOD RIGHT HAND  Final   Special Requests   Final    BOTTLES DRAWN AEROBIC AND ANAEROBIC Blood Culture adequate volume   Culture   Final    NO GROWTH < 12 HOURS Performed at [facility], 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 Electrical activity was acquired at a sampling rate of 500Hz  and reviewed with a high frequency filter of 70Hz  and a low frequency filter of 1Hz . EEG data were recorded continuously and digitally stored. BACKGROUND ACTIVITY: Posterior dominant rhythm: The posterior dominant rhythm consists of 9 Hz activity of moderate voltage (25-35 uV) seen predominantly in posterior head regions.        Slowing: None EPILEPTIFORM ACTIVITY: Interictal epileptiform activity: None Ictal Activity: None OTHER EVENTS: None SLEEP RECORDINGS: Symmetric sleep spindles are seen. ACTIVATION PROCEDURES: Hyperventilation and photic stimulation were not performed. IMPRESSION: This study is within normal limits. There was no seizure or evidence of seizure predisposition recorded on this study. Please note that lack of epileptiform activity on EEG does not preclude the possibility of epilepsy.  Ritta SlotMcNeill Kirkpatrick, MD [facility] [phone] If 7pm- 7am, please page neurology on call as listed in AMION.   CT HEAD WO CONTRAST  Result Date: 2020-03-2819 CLINICAL DATA:  28 year old, current history of HIV, presenting with a 1 week history of headache, vomiting and insomnia which the patient states occurred after incision and drainage of an abscess on the RIGHT side of his forehead. The patient has a personal history of a ventriculoperitoneal shunt catheter which was removed in January, 2019. 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 EXAM: CT HEAD WITHOUT CONTRAST TECHNIQUE: Contiguous axial images were obtained from the base of the skull through the vertex  without intravenous contrast. COMPARISON:  01/08/2019 and earlier. FINDINGS: Brain: Mild cortical and deep atrophy, unchanged, advanced for patient age and likely related to the combination of HIV and prior meningoencephalitis. Persistent low-attenuation in both cerebellar hemispheres, RIGHT greater than LEFT, unchanged and likely related to prior ischemia/infarction. Similar ischemic changes in the occipital lobes bilaterally, LEFT greater  than RIGHT, progressive since the most recent prior CT in January of this year. Low attenuation involving the frontal lobes, RIGHT greater than LEFT, also progressive since the most recent prior CT. No mass lesion. No acute hemorrhage or hematoma. No extra-axial fluid collections. Vascular: No hyperdense vessel. No visible atherosclerosis. Skull: No skull fracture or other focal osseous abnormality involving the skull. Sinuses/Orbits: Visualized paranasal sinuses, bilateral mastoid air cells and bilateral middle ear cavities well-aerated. Visualized orbits and globes normal in appearance. Other: None. IMPRESSION: 1. Progressive edema and/or ischemic changes in the frontal lobes and the occipital lobes bilaterally since the most recent prior examination in January, 2020. Query encephalitis. MRI of the brain may be helpful in further evaluation to confirm or deny this finding. 2. Stable ischemic changes involving the cerebellar hemispheres bilaterally, RIGHT greater than LEFT. 3. Stable cortical and deep atrophy which is advanced for patient age, likely related to HIV and the sequela of the prior meningoencephalitis. Electronically Signed   By: Hulan Saas M.D.   On: 10-Jan-2020 19:39   MR BRAIN WO CONTRAST  Addendum Date: 12/21/2019   ADDENDUM REPORT: 12/21/2019 00:13 ADDENDUM: Further comparison made to brain MRI of 05/25/2017 and head CT of 01/08/2019 and 12/31/2017. Since the study of 05/25/2017, there has been marked increase in the size of the lateral and third  ventricles. Compared to the 01/08/2019 study, there is increased ballooning of the atria of the lateral ventricles and slightly increased dilatation of the temporal horns. Otherwise, any progressive dilatation is subtle. This was discussed with Dr. Caryl Pina at 12:10 a.m. on 12/21/2019. Electronically Signed   By: Deatra Robinson M.D.   On: 12/21/2019 00:13   Result Date: 12/21/2019 CLINICAL DATA:  Encephalopathy. History of HIV with 1 week history of headache. EXAM: MRI HEAD WITHOUT CONTRAST TECHNIQUE: Multiplanar, multiecho pulse sequences of the brain and surrounding structures were obtained without intravenous contrast. COMPARISON:  Brain MRI 05/25/2017 Head CT 01/10/20 FINDINGS: The examination had to be discontinued prior to completion due to patient altered mental status and inability to lie still during the study. Only axial and coronal diffusion-weighted imaging was obtained. There is advanced atrophy for age with enlarged lateral ventricles and third ventricle. There is no midline shift or other mass effect. There is no abnormal reduced diffusivity. IMPRESSION: 1. Truncated examination due to patient altered mental status and inability to lie still during the study. 2. No acute ischemia. 3. Advanced atrophy for age. Electronically Signed: By: Deatra Robinson M.D. On: 01-10-20 23:49     Assessment/Plan: Mental Status Change Hx of Crytpo AIDS Non-compliance CKD 2 (nadir GFR 53) Liver disease (ETOH?) Dementia  Total days of antibiotics:2  Acyclovir/amp/ceftriaxone/vanco/decardon/5FC Prezcobix-tivicay  Continued confusion Await his LP No change in his anbx for now, again this seems less likely to be bacterial or herpetic.  Cr slightly worse (as expected on ampho and 5FC) Would send JC virus PCR on CSF as well (though he does not have consistent changes noted on his MRI)         Johny Sax MD, FACP Infectious Diseases (pager) [phone] [email or web address] 12/22/2019, 9:25 AM  LOS: 1 day

## 2019-12-22 NOTE — Progress Notes (Signed)
EEG complete - results pending 

## 2019-12-22 NOTE — Progress Notes (Signed)
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  His admitting complaints also consisted in part of severe headache.  Patient has been evaluated by infectious disease. Patient was evaluated in the ICU.  He is about to undergo therapy after having been loaded with Keppra and phenytoin with propofol and Versed per neurology..  CT scan at time of admission did show frontal and occipital lobe edema with progressive hydrocephalus. On my evaluation the patient had been sedated and paralyzed prior to intubation.  Past Medical History   Aggressive behavior 06/02/2019   . 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 AKI (acute kidney injury) (HCC) 06/19/2019  . Alcoholic hepatitis 01/14/2019  . Anxiety   . 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 Bipolar 1 disorder (HCC)   . Chronic hepatitis C with hepatic coma (HCC)   . 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 Cirrhosis of liver with ascites (HCC)   . Cognitive communication deficit    working w/ therapy  . 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 Depression   . 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 Encephalopathy, hepatic (HCC) 06/26/2019  . Fecal  incontinence 11/19/2017  . Foley catheter in place   . 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 History of scabies 2013  . History of syphilis   . 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 Nausea and vomiting 01/14/2019  . 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 Neurogenic bowel    wears depends  . Neuromuscular disorder (HCC)   . 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 Seizure disorder [facility])    neurologist-  dr Marjory Lies ([facility])--caused by cryptococcal meningitis/ multiple infarctions--  no seizure since hospitalization May 2018  . Status epilepticus (HCC)   . URI (upper respiratory infection) 02/24/2019  . Visuospatial deficit      Significant Hospital Events   as below  Consults:  ID neurology PCCM  Procedures:  Intubation 12/22/2019  Significant Diagnostic Tests:  CT of the brain as above  Micro Data:  NA  Antimicrobials:  Ampicillin 12/27>> Rocephin 12/26>> Vancomycin 12/27>> Acyclovir 12/27>> amphoteracin B>>12/27 Flucytosince>>12/27  Interim history/subjective:  Admitted 12/02/2019 status epilepticus unresponsive to loading doses of phenytoin and Keppra 12/22/2019 Intubated 12/22/2019  Objective   Blood pressure 137/71, pulse (!) 128, temperature 98.7 F (37.1 C), temperature source Oral, resp. rate (!) 30, height 6\' 1"  (1.854 m), weight 91.6 kg, SpO2 97 %.    Vent Mode: PRVC FiO2 (%):  [100 %] 100 % Set Rate:  [16 bmp] 16 bmp  Vt Set:  [540 mL-640 mL] 640 mL PEEP:  [5 cmH20] 5 cmH20 Plateau Pressure:  [20 cmH20] 20 cmH20   Intake/Output Summary (Last 24 hours) at 12/22/2019 2228 Last data filed at 12/22/2019 1824 Gross per 24 hour  Intake 120 ml  Output 1125 ml  Net -1005 ml   Filed Weights   01-10-2019 2300    Weight: 91.6 kg    Examination: General: Well-developed white male sedated and paralyzed HENT: Intubated some macroglossia Lungs: Clear Cardiovascular: Regular rate and rhythm Abdomen: Benign Extremities: Within normal limits Neuro: As above GU: N/A  Resolved Hospital Problem list   NA  Assessment & Plan:  1.  Status epilepticus: Further therapy to be determined by neurology  2.  Ventilator management: No change in ventilator settings currently.  Blood gas pending.  Will manage while in the ICU.  3.  History of cirrhosis  4.  HIV positive with history of cryptococcal meningitis being followed by infectious disease  Best practice:  Diet: N.p.o. Pain/Anxiety/Delirium protocol (if indicated): Burst suppression therapy VAP protocol (if indicated): Yes DVT prophylaxis: Lovenox SCD GI prophylaxis: Pepcid Glucose control: Monitor Mobility: Bedrest Code Status: Full Family Communication: Unavailable Disposition: To 4 N. for further ICU therapy  Labs   CBC: Recent Labs  Lab 01-10-2019 1516 12/21/19 0533 12/21/19 1606 12/22/19 0729  WBC 4.7 5.1 7.8 12.8*  NEUTROABS 3.0  --   --   --   HGB 14.4 11.9* 11.6* 12.1*  HCT 41.0 33.6* 32.7* 34.6*  MCV 94.5 93.6 93.2 94.5  PLT 90* 80* 97* 103*    Basic Metabolic Panel: Recent Labs  Lab 01-10-2019 1516 12/21/19 0533 12/21/19 1606 12/22/19 0729  NA 138 137 137 137  K 2.9* 3.2* 3.3* 4.3  CL 107 110 111 109  CO2 22 18* 17* 16*  GLUCOSE 83 99 109* 135*  BUN 10 11 12 14   CREATININE 1.64* 1.34* 1.46* 1.48*  CALCIUM 8.9 7.7* 8.0* 8.0*  MG  --  1.7 1.8 2.4  PHOS  --   --  2.2*  --    GFR: Estimated Creatinine Clearance: 84 mL/min (A) (by C-G formula based on SCr of 1.48 mg/dL (H)). Recent Labs  Lab 01-10-2019 1516 01-10-2019 1941 12/21/19 0533 12/21/19 1606 12/22/19 0729  WBC 4.7  --  5.1 7.8 12.8*  LATICACIDVEN 1.1 5.8* 1.5  --   --     Liver Function Tests: Recent Labs  Lab 01-10-2019 1516 01-10-2019 1947  12/21/19 0533 12/21/19 1606 12/22/19 0729  AST 145* 148* 113* 109* 129*  ALT 60* 54* 51* 49* 57*  ALKPHOS 125 106 95 93 93  BILITOT 10.2* 9.2* 8.4* 8.0* 7.2*  PROT 11.0* 9.8* 9.1* 9.4* 9.4*  ALBUMIN 2.3* 2.0* 1.8* 1.9* 1.9*   Recent Labs  Lab 01-10-2019 1947  LIPASE 39   Recent Labs  Lab 01-10-2019 1947 12/22/19 2006  AMMONIA 154* <9*    ABG    Component Value Date/Time   PHART 7.440 05/27/2017 0102   PCO2ART 30.7 (L) 05/27/2017 0102   PO2ART 107.0 05/27/2017 0102   HCO3 20.9 05/27/2017 0102   TCO2 23 10/10/2017 1016   ACIDBASEDEF 2.0 05/27/2017 0102   O2SAT 98.0 05/27/2017 0102     Coagulation Profile: Recent Labs  Lab 01-10-2019 1947 12/21/19 0533 12/21/19 1606  INR 1.5* 1.5* 1.7*    Cardiac Enzymes: Recent Labs  Lab 12/21/19 0533  CKTOTAL 191    HbA1C: No results found for: HGBA1C  CBG: Recent Labs  Lab  01/17/2020 1956 12/21/19 0752  GLUCAP 77 91    Review of Systems:   Unobtainable  Past Medical History  He,  has a past medical history of Aggressive behavior (06/02/2019), 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   Surgical History    Past Surgical History:  Procedure Laterality Date  . CYSTOSCOPY N/A 10/10/2017   Procedure: CYSTOSCOPY;  Surgeon: Sebastian Ache, MD;  Location: [facility];  Service: Urology;  Laterality: N/A;  . EXAMINATION UNDER ANESTHESIA N/A 12/28/2014   Procedure: EXAM UNDER ANESTHESIA;  Surgeon: Karie Soda, MD;  Location: [facility];  Service: General;  Laterality: N/A;  . FLOOR OF MOUTH BIOPSY N/A 09/16/2016   Procedure: BIOPSY OF ORAL ABCESS;  Surgeon: Newman Pies, MD;  Location: [facility];  Service: ENT;  Laterality: N/A;  . INCISION AND DRAINAGE ABSCESS N/A 09/16/2016   Procedure: INCISION AND DRAINAGE ABSCESS;  Surgeon: Newman Pies, MD;  Location: [facility];  Service: ENT;  Laterality: N/A;  . INCISION AND DRAINAGE PERIRECTAL ABSCESS  09/ 03/ 2011   dr toth  . INCISION AND DRAINAGE PERIRECTAL ABSCESS N/A 12/28/2014   Procedure: IRRIGATION AND DEBRIDEMENT PERIRECTAL ABSCESS;  Surgeon: Karie Soda, MD;  Location: [facility];  Service: General;  Laterality: N/A;  Fistula repair and ablation  . INSERTION OF SUPRAPUBIC CATHETER N/A 10/10/2017   Procedure: INSERTION OF SUPRAPUBIC CATHETER;  Surgeon: Sebastian Ache, MD;  Location: [facility];  Service: Urology;  Laterality: N/A;  . LAPAROSCOPIC REVISION VENTRICULAR-PERITONEAL (V-P) SHUNT N/A 06/12/2017   Procedure: LAPAROSCOPIC REVISION VENTRICULAR-PERITONEAL (V-P) SHUNT;  Surgeon: Kinsinger, De Blanch, MD;  Location: [facility];  Service: General;  Laterality: N/A;  . SHUNT REMOVAL N/A 01/01/2018   Procedure: REMOVAL OF V-P SHUNT, PLACEMENT OF EVD;  Surgeon: [name], MD;  Location: [facility];  Service: Neurosurgery;  Laterality: N/A;  . TRANSTHORACIC ECHOCARDIOGRAM  06/02/2017    EF 65-70%/  trivial PR  . VENTRICULOPERITONEAL SHUNT N/A 06/12/2017   Procedure: SHUNT INSERTION VENTRICULAR-PERITONEAL;  Surgeon: [name], MD;  Location: [facility];  Service: Neurosurgery;  Laterality: N/A;     Social History   reports that he has been smoking cigarettes. He has a 2.00 pack-year smoking history. He has never used smokeless tobacco. He reports current alcohol use. He reports that he does not use drugs.   Family History   His family history includes Breast cancer in his maternal grandmother; Hypertension in an other family member.   Allergies Allergies  Allergen Reactions  . Acyclovir And Related Itching     Home Medications  Prior to Admission medications   Medication Sig Start Date End Date Taking? Authorizing Provider  Darunavir-Cobicisctat-Emtricitabine-Tenofovir Alafenamide (SYMTUZA) 800-150-200-10 MG TABS Take  1 tablet by mouth daily with breakfast. 03/25/19  Yes Tommy Medal, Lavell Islam, MD  diclofenac sodium (VOLTAREN) 1 % GEL Apply 2 g topically 4 (four) times daily as needed (bilateral ankle pain). 07/07/19  Yes Neva Seat, MD  dolutegravir (TIVICAY) 50 MG tablet Take 1 tablet (50 mg total) by mouth daily. 03/25/19  Yes Tommy Medal, Lavell Islam, MD  furosemide (LASIX) 20 MG tablet Take 1 tablet (20 mg total) by mouth daily as needed for fluid or edema (leg swellings). Take extra dose of potassium if you take this medication Patient taking differently: Take 20 mg by mouth every Monday, Wednesday, and Friday.  06/08/19 06/07/20 Yes [place] Shi, MD  levETIRAcetam (KEPPRA) 1000 MG tablet Take 1 tablet (1,000 mg total) by mouth 2 (two) times daily. 03/25/19  Yes Tommy Medal, Lavell Islam, MD  ondansetron (ZOFRAN) 4 MG tablet Take 1 tablet (4 mg total) by mouth every 6 (six) hours as needed for nausea or vomiting. 08/21/19  Yes Agyei, Caprice Kluver, MD  rifaximin (XIFAXAN) 550 MG TABS tablet Take 1 tablet (550 mg total) by mouth 2 (two) times daily. 06/26/19  Yes Gatha Mayer,  MD  spironolactone (ALDACTONE) 50 MG tablet Take 1 tablet (50 mg total) by mouth 2 (two) times daily. 07/30/19  Yes Marianna Payment, MD  valACYclovir (VALTREX) 1000 MG tablet Take 1 tablet (1,000 mg total) by mouth every Monday, Wednesday, and Friday. 06/09/19  Yes [place] Shi, MD  fluconazole (DIFLUCAN) 200 MG tablet Take 200 mg by mouth daily.    [provider]  lactulose (CHRONULAC) 10 GM/15ML solution Take 45 mLs (30 g total) by mouth 3 (three) times daily. 07/07/19   Neva Seat, MD  Magnesium Oxide 200 MG TABS Take 1 tablet (200 mg total) by mouth daily. Patient not taking: Reported on 07/04/2019 06/08/19   [place] Shi, MD  nicotine (NICODERM CQ - DOSED IN MG/24 HOURS) 21 mg/24hr patch Place 1 patch (21 mg total) onto the skin daily. Patient not taking: Reported on 12/21/2019 07/08/19   Neva Seat, MD  QUEtiapine (SEROQUEL) 25 MG tablet Take 1 tablet (25 mg total) by mouth 2 (two) times daily as needed (agitation). Patient taking differently: Take 25 mg by mouth every Monday, Wednesday, and Friday.  03/25/19   Truman Hayward, MD  sulfamethoxazole-trimethoprim (BACTRIM) 400-80 MG tablet Take 1 tablet by mouth daily. Patient not taking: Reported on 12/21/2019 06/05/19   [place] Callas, NP     Critical care time: Over 35 minutes spent in chart review critical care planning and bedside evaluation.

## 2019-12-22 NOTE — Progress Notes (Signed)
Reassessed at 11:35 PM. Still with clinical and electrographic seizure activity after Versed bolus and at infusion rate of 50 with addition of propofol at a rate of 10.   BP slightly improved with NS bolus running, but still soft so will start titratable neosynephrine.   Will need to bolus with 100 mg propofol x 1 now as Versed so far has not been effective at resolving seizure activity.   5 additional minutes of ICU time.   Electronically signed: Dr. Kerney Elbe

## 2019-12-22 NOTE — Progress Notes (Signed)
Subjective: Pt seen at the bedside this morning. Nursing staff reports pt was confused and agitated overnight, attempting to get up, and pulling out IVs. CIWA score increased from 4 >> 9, and he was given 1mg  ativan. This morning, pt was lethargic and sleeping. Appeared to still be confused, stirring to voice and mumbling responses. Sitter at the bedside.  Objective:  Vital signs in last 24 hours: Vitals:   12/21/19 2000 12/21/19 2316 12/22/19 0100 12/22/19 0323  BP:  109/67 110/72 (!) 111/50  Pulse: 99 (!) 101 98 (!) 104  Resp:  16  17  Temp:  (!) 97.3 F (36.3 C)  (!) 97.5 F (36.4 C)  TempSrc:  Oral  Oral  SpO2:  100%  100%  Weight:      Height:       Physical Exam Constitutional:      General: He is sleeping. He is not in acute distress.    Appearance: He is not ill-appearing.  Cardiovascular:     Rate and Rhythm: Normal rate and regular rhythm.     Heart sounds: Normal heart sounds.  Pulmonary:     Effort: Pulmonary effort is normal.     Breath sounds: Normal breath sounds.  Musculoskeletal:     Right lower leg: No edema.     Left lower leg: No edema.  Skin:    General: Skin is warm and dry.  Neurological:     Mental Status: He is confused.    Assessment/Plan:  Active Problems:   Nonintractable headache   Seizure (HCC)   AMS (altered mental status)  [name] is a 28 year old M with significant PMH of HIV, previous cryptococcal meningencephalitis with shunt placement and removal, seizure disorder, cirrhosis from chronic hep B, hep C, and alcoholic use, bipolar disorder, and dementia, who presented with headache, vomiting, and insomnia and had a witnessed seizure in the ED after not having access to his medications for 10 days.   Acute encephalopathy Hydrocephalus Pt with HIV/AIDs presented with headache and vomiting, suggestive of increased intracranial pressure. MRI with marked increase in size of lateral and third ventricles compared to 2 years ago.  Differential includes infectious etiology vs normal pressure hydrocephalus.  - unfortunately, LP was postponed this morning as pt had received enoxaparin at midnight  - discontinued enoxaparin for tonight and ordered SCDs  ID consulted, appreciate recommendations - recurrence of cryptococcal meningitis seems unlikely, as does HSV or bacterial meningitis - continue treatment regiment with ampicillin, ceftriaxone, vancomycin, amphotericin B, acyclovir, flucytosine, and dexamethasone - obtain LP - will send JC virus PCR studies as well   Seizure Pt with witnessed seizure in the ED secondary to medication non-adherence. On Keppra 1000mg  BID at home. - continue Keppra - continue seizure precautions Neurology consulted yesterday, appreciate their input - no medication adjustments needed, continue medication pt was not taking   HIV Pt follows with Dr. 26 outpatient, with last CD4 count 110 in 06/25/2019. On symtuza and tivicay at home with diflucan, bactrim, and acyclovir for opportunistic infection ppx.  - ID on board - rechecking HIV RNA and CD4 count - restarting home medications as above   Cirrhosis secondary to chronic hep B, hep C, and EtOH use Pt with history of cirrhosis per clinical findings only. Only hepatic steatosis seen on ultrasound and CT. Pt last seen by GI via telehealth appointment on 06/26/2019 with Dr. 08/26/2019 who added rifaximin and recommended continued lab testing with follow-up assessments. However, pt had not  followed-up since. On admission, pt had thrombocytopenia to 90, mild elevation in INR to 1.5, T bili 10.2, AST 145, ALT 60, and ammonia 154. Uncertain when pt's last drink was.  - continue rifaximin and lactulose - monitor daily INR and CMP - CIWA without ativan   AKI Cr on admission 1.64, with prior baseline 1.2. Appears to be improving with Cr 1.4 this morning - will continue to monitor   Diet - regular Fluids - none DVT ppx - SCDs, holding  enoxaparin in anticipation of LP CODE STATUS - FULL CODE  Dispo: Anticipated discharge pending clinical improvement  Ladona Horns, MD 12/22/2019, 6:41 AM Pager: [phone]

## 2019-12-22 NOTE — Progress Notes (Signed)
Total of 4mg  Ativan admin per Dr. Cheral Marker at this time. See [facility]

## 2019-12-22 NOTE — Procedures (Addendum)
Patient Name: Justin Ball  MRN: [id number]  Epilepsy Attending: Lora Havens  Referring Physician/Provider: Dr Modena Nunnery Date: 12/22/2019 Duration: 21.24 mins  Patient history: 28 year old, HIV+ presented for persistent headache and had prolonged seizure today. EEG to evaluate for status epilepticus  Level of alertness: comatose  AEDs during EEG study: LEV, PHT, ativan  Technical aspects: This EEG study was done with scalp electrodes positioned according to the 10-20 International system of electrode placement. Electrical activity was acquired at a sampling rate of 500Hz  and reviewed with a high frequency filter of 70Hz  and a low frequency filter of 1Hz . EEG data were recorded continuously and digitally stored.   DESCRIPTION: At the beginning of study, EEG showed continuous generalized 3-5hz  theta-delta slowing admixed with 13-15hz  generalized beta activity. One seizure was recorded at 1935 during which patient was noted to have left arm and leg jerking. Concomitant EEG showed rhythmic 2-3hz  delta slowing in right posterior quadrant which evolved into spikes at 1hz . Seizure was waxing and waning but ongoing at the end of study at 1937. Hyperventilation and photic stimulation were not performed.  ABNORMALITY - Continuous slow, generalized - Seizure, right posterior quadrant  IMPRESSION: This study showed one seizure arising from right posterior quadrant during which patient was noted to have left arm and leg jerking, lasting over 2 minutes ( study ended while seizure was ongoing). There is also evidence of severe diffuse encephalopathy, non specific to etiology.   Of note, per history patient has been having frequent left sided jerking for prolonged period and is not back to baseline and therefore meets criteria for status epilepticus.   Dr Cheral Marker was notified.   Sharice Harriss Barbra Sarks

## 2019-12-23 ENCOUNTER — Inpatient Hospital Stay (HOSPITAL_COMMUNITY): Payer: Medicare Other

## 2019-12-23 DIAGNOSIS — Z9911 Dependence on respirator [ventilator] status: Secondary | ICD-10-CM

## 2019-12-23 DIAGNOSIS — B1910 Unspecified viral hepatitis B without hepatic coma: Secondary | ICD-10-CM

## 2019-12-23 DIAGNOSIS — R410 Disorientation, unspecified: Secondary | ICD-10-CM

## 2019-12-23 DIAGNOSIS — R569 Unspecified convulsions: Secondary | ICD-10-CM

## 2019-12-23 DIAGNOSIS — R4189 Other symptoms and signs involving cognitive functions and awareness: Secondary | ICD-10-CM

## 2019-12-23 DIAGNOSIS — B1920 Unspecified viral hepatitis C without hepatic coma: Secondary | ICD-10-CM

## 2019-12-23 DIAGNOSIS — F1990 Other psychoactive substance use, unspecified, uncomplicated: Secondary | ICD-10-CM

## 2019-12-23 DIAGNOSIS — G40901 Epilepsy, unspecified, not intractable, with status epilepticus: Secondary | ICD-10-CM

## 2019-12-23 DIAGNOSIS — B451 Cerebral cryptococcosis: Principal | ICD-10-CM

## 2019-12-23 DIAGNOSIS — K7460 Unspecified cirrhosis of liver: Secondary | ICD-10-CM

## 2019-12-23 LAB — CBC
HCT: 27.6 % — ABNORMAL LOW (ref 39.0–52.0)
HCT: 29.6 % — ABNORMAL LOW (ref 39.0–52.0)
Hemoglobin: 9.8 g/dL — ABNORMAL LOW (ref 13.0–17.0)
Hemoglobin: 9.9 g/dL — ABNORMAL LOW (ref 13.0–17.0)
MCH: 33.1 pg (ref 26.0–34.0)
MCH: 33.1 pg (ref 26.0–34.0)
MCHC: 33.4 g/dL (ref 30.0–36.0)
MCHC: 35.5 g/dL (ref 30.0–36.0)
MCV: 93.2 fL (ref 80.0–100.0)
MCV: 99 fL (ref 80.0–100.0)
Platelets: 109 10*3/uL — ABNORMAL LOW (ref 150–400)
Platelets: 124 10*3/uL — ABNORMAL LOW (ref 150–400)
RBC: 2.96 MIL/uL — ABNORMAL LOW (ref 4.22–5.81)
RBC: 2.99 MIL/uL — ABNORMAL LOW (ref 4.22–5.81)
RDW: 15.5 % (ref 11.5–15.5)
RDW: 16.7 % — ABNORMAL HIGH (ref 11.5–15.5)
WBC: 12.3 10*3/uL — ABNORMAL HIGH (ref 4.0–10.5)
WBC: 13.2 10*3/uL — ABNORMAL HIGH (ref 4.0–10.5)
nRBC: 0.2 % (ref 0.0–0.2)
nRBC: 0.7 % — ABNORMAL HIGH (ref 0.0–0.2)

## 2019-12-23 LAB — TYPE AND SCREEN
ABO/RH(D): B NEG
Antibody Screen: NEGATIVE

## 2019-12-23 LAB — GLUCOSE, CAPILLARY
Glucose-Capillary: 115 mg/dL — ABNORMAL HIGH (ref 70–99)
Glucose-Capillary: 116 mg/dL — ABNORMAL HIGH (ref 70–99)
Glucose-Capillary: 134 mg/dL — ABNORMAL HIGH (ref 70–99)
Glucose-Capillary: 149 mg/dL — ABNORMAL HIGH (ref 70–99)
Glucose-Capillary: 155 mg/dL — ABNORMAL HIGH (ref 70–99)

## 2019-12-23 LAB — PHOSPHORUS
Phosphorus: 5 mg/dL — ABNORMAL HIGH (ref 2.5–4.6)
Phosphorus: 6.2 mg/dL — ABNORMAL HIGH (ref 2.5–4.6)

## 2019-12-23 LAB — CRYPTOCOCCAL ANTIGEN, CSF
Crypto Ag: POSITIVE — AB
Cryptococcal Ag Titer: 40 — AB

## 2019-12-23 LAB — POCT I-STAT 7, (LYTES, BLD GAS, ICA,H+H)
Acid-base deficit: 18 mmol/L — ABNORMAL HIGH (ref 0.0–2.0)
Bicarbonate: 9.4 mmol/L — ABNORMAL LOW (ref 20.0–28.0)
Calcium, Ion: 0.95 mmol/L — ABNORMAL LOW (ref 1.15–1.40)
HCT: 32 % — ABNORMAL LOW (ref 39.0–52.0)
Hemoglobin: 10.9 g/dL — ABNORMAL LOW (ref 13.0–17.0)
O2 Saturation: 98 %
Patient temperature: 98
Potassium: 3.8 mmol/L (ref 3.5–5.1)
Sodium: 144 mmol/L (ref 135–145)
TCO2: 10 mmol/L — ABNORMAL LOW (ref 22–32)
pCO2 arterial: 25.8 mmHg — ABNORMAL LOW (ref 32.0–48.0)
pH, Arterial: 7.167 — CL (ref 7.350–7.450)
pO2, Arterial: 126 mmHg — ABNORMAL HIGH (ref 83.0–108.0)

## 2019-12-23 LAB — COMPREHENSIVE METABOLIC PANEL
ALT: 50 U/L — ABNORMAL HIGH (ref 0–44)
AST: 98 U/L — ABNORMAL HIGH (ref 15–41)
Albumin: 1.6 g/dL — ABNORMAL LOW (ref 3.5–5.0)
Alkaline Phosphatase: 76 U/L (ref 38–126)
Anion gap: 13 (ref 5–15)
BUN: 20 mg/dL (ref 6–20)
CO2: 15 mmol/L — ABNORMAL LOW (ref 22–32)
Calcium: 7.7 mg/dL — ABNORMAL LOW (ref 8.9–10.3)
Chloride: 114 mmol/L — ABNORMAL HIGH (ref 98–111)
Creatinine, Ser: 1.99 mg/dL — ABNORMAL HIGH (ref 0.61–1.24)
GFR calc Af Amer: 51 mL/min — ABNORMAL LOW (ref >60)
GFR calc non Af Amer: 44 mL/min — ABNORMAL LOW (ref >60)
Glucose, Bld: 136 mg/dL — ABNORMAL HIGH (ref 70–99)
Potassium: 3.5 mmol/L (ref 3.5–5.1)
Sodium: 142 mmol/L (ref 135–145)
Total Bilirubin: 6 mg/dL — ABNORMAL HIGH (ref 0.3–1.2)
Total Protein: 8.1 g/dL (ref 6.5–8.1)

## 2019-12-23 LAB — BASIC METABOLIC PANEL
Anion gap: 14 (ref 5–15)
BUN: 25 mg/dL — ABNORMAL HIGH (ref 6–20)
CO2: 12 mmol/L — ABNORMAL LOW (ref 22–32)
Calcium: 6.9 mg/dL — ABNORMAL LOW (ref 8.9–10.3)
Chloride: 112 mmol/L — ABNORMAL HIGH (ref 98–111)
Creatinine, Ser: 2.13 mg/dL — ABNORMAL HIGH (ref 0.61–1.24)
GFR calc Af Amer: 47 mL/min — ABNORMAL LOW (ref >60)
GFR calc non Af Amer: 41 mL/min — ABNORMAL LOW (ref >60)
Glucose, Bld: 129 mg/dL — ABNORMAL HIGH (ref 70–99)
Potassium: 2.8 mmol/L — ABNORMAL LOW (ref 3.5–5.1)
Sodium: 138 mmol/L (ref 135–145)

## 2019-12-23 LAB — CRYPTOCOCCAL ANTIGEN
Crypto Ag: POSITIVE — AB
Cryptococcal Ag Titer: 640 — AB

## 2019-12-23 LAB — POCT I-STAT EG7
Acid-base deficit: 16 mmol/L — ABNORMAL HIGH (ref 0.0–2.0)
Bicarbonate: 11.4 mmol/L — ABNORMAL LOW (ref 20.0–28.0)
Calcium, Ion: 0.93 mmol/L — ABNORMAL LOW (ref 1.15–1.40)
HCT: 29 % — ABNORMAL LOW (ref 39.0–52.0)
Hemoglobin: 9.9 g/dL — ABNORMAL LOW (ref 13.0–17.0)
O2 Saturation: 82 %
Patient temperature: 95.3
Potassium: 3.5 mmol/L (ref 3.5–5.1)
Sodium: 145 mmol/L (ref 135–145)
TCO2: 12 mmol/L — ABNORMAL LOW (ref 22–32)
pCO2, Ven: 28.5 mmHg — ABNORMAL LOW (ref 44.0–60.0)
pH, Ven: 7.199 — CL (ref 7.250–7.430)
pO2, Ven: 50 mmHg — ABNORMAL HIGH (ref 32.0–45.0)

## 2019-12-23 LAB — VALPROIC ACID LEVEL: Valproic Acid Lvl: 58 ug/mL (ref 50.0–100.0)

## 2019-12-23 LAB — MAGNESIUM
Magnesium: 2.7 mg/dL — ABNORMAL HIGH (ref 1.7–2.4)
Magnesium: 2.8 mg/dL — ABNORMAL HIGH (ref 1.7–2.4)
Magnesium: 2.9 mg/dL — ABNORMAL HIGH (ref 1.7–2.4)

## 2019-12-23 LAB — AMMONIA: Ammonia: <9 umol/L — ABNORMAL LOW (ref 9–35)

## 2019-12-23 LAB — LACTIC ACID, PLASMA
Lactic Acid, Venous: 7.3 mmol/L (ref 0.5–1.9)
Lactic Acid, Venous: 7.4 mmol/L (ref 0.5–1.9)

## 2019-12-23 LAB — PROTEIN AND GLUCOSE, CSF
Glucose, CSF: 107 mg/dL — ABNORMAL HIGH (ref 40–70)
Total  Protein, CSF: 68 mg/dL — ABNORMAL HIGH (ref 15–45)

## 2019-12-23 LAB — TRIGLYCERIDES: Triglycerides: 88 mg/dL (ref <150)

## 2019-12-23 LAB — MRSA PCR SCREENING: MRSA by PCR: NEGATIVE

## 2019-12-23 LAB — VANCOMYCIN, TROUGH: Vancomycin Tr: 38 ug/mL (ref 15–20)

## 2019-12-23 MED ORDER — VANCOMYCIN HCL 1250 MG/250ML IV SOLN: 1250.0000 mg | INTRAVENOUS | Status: DC

## 2019-12-23 MED ORDER — PROPOFOL BOLUS VIA INFUSION
1.0000 mg/kg | Freq: Once | INTRAVENOUS | Status: AC
  Administered 2019-12-23: 100 mg via INTRAVENOUS

## 2019-12-23 MED ORDER — VITAL HIGH PROTEIN PO LIQD
1000.0000 mL | ORAL | Status: DC
  Administered 2019-12-23: 11:00:00 1000 mL

## 2019-12-23 MED ORDER — PRO-STAT SUGAR FREE PO LIQD
30.0000 mL | Freq: Two times a day (BID) | ORAL | Status: DC
  Administered 2019-12-23: 10:00:00 30 mL
  Filled 2019-12-23: qty 30

## 2019-12-23 MED ORDER — NOREPINEPHRINE 16 MG/250ML-% IV SOLN
0.0000 ug/min | INTRAVENOUS | Status: DC
  Administered 2019-12-23: 28 ug/min via INTRAVENOUS
  Administered 2019-12-24: 20:00:00 35 ug/min via INTRAVENOUS
  Administered 2019-12-24: 29.973 ug/min via INTRAVENOUS
  Administered 2019-12-25: 04:00:00 40 ug/min via INTRAVENOUS
  Filled 2019-12-23 (×5): qty 250

## 2019-12-23 MED ORDER — FLUCYTOSINE 250 MG PO CAPS
25.0000 mg/kg | ORAL_CAPSULE | Freq: Four times a day (QID) | ORAL | Status: DC
  Administered 2019-12-24 (×2): 2250 mg
  Filled 2019-12-23 (×3): qty 9

## 2019-12-23 MED ORDER — VITAL HIGH PROTEIN PO LIQD: 1000.0000 mL | ORAL | Status: DC

## 2019-12-23 MED ORDER — MIDAZOLAM BOLUS VIA INFUSION
10.0000 mg | Freq: Once | INTRAVENOUS | Status: AC
  Administered 2019-12-23: 06:00:00 10 mg via INTRAVENOUS
  Filled 2019-12-23: qty 10

## 2019-12-23 MED ORDER — SODIUM CHLORIDE 0.9 % IV BOLUS FOR AMBISOME
1000.0000 mL | INTRAVENOUS | Status: DC
  Administered 2019-12-23 – 2019-12-24 (×2): 1000 mL via INTRAVENOUS

## 2019-12-23 MED ORDER — FLUCYTOSINE 250 MG PO CAPS
25.0000 mg/kg | ORAL_CAPSULE | Freq: Four times a day (QID) | ORAL | Status: DC
  Administered 2019-12-23 (×3): 2250 mg
  Filled 2019-12-23 (×6): qty 9

## 2019-12-23 MED ORDER — FAMOTIDINE 40 MG/5ML PO SUSR
20.0000 mg | Freq: Every day | ORAL | Status: DC
  Administered 2019-12-23 – 2019-12-25 (×3): 20 mg
  Filled 2019-12-23 (×2): qty 2.5

## 2019-12-23 MED ORDER — MIDAZOLAM 50MG/50ML (1MG/ML) PREMIX INFUSION: 80.0000 mg/h | INTRAVENOUS | Status: DC

## 2019-12-23 MED ORDER — FAMOTIDINE 40 MG/5ML PO SUSR
20.0000 mg | Freq: Every day | ORAL | Status: DC
  Filled 2019-12-23: qty 2.5

## 2019-12-23 MED ORDER — FLUCYTOSINE 500 MG PO CAPS
25.0000 mg/kg | ORAL_CAPSULE | Freq: Four times a day (QID) | ORAL | Status: DC
  Administered 2019-12-23: 2250 mg
  Filled 2019-12-23 (×3): qty 1

## 2019-12-23 MED ORDER — SODIUM BICARBONATE 8.4 % IV SOLN
100.0000 meq | Freq: Once | INTRAVENOUS | Status: AC
  Administered 2019-12-23: 100 meq via INTRAVENOUS
  Filled 2019-12-23: qty 100

## 2019-12-23 MED ORDER — MIDAZOLAM HCL 2 MG/2ML IJ SOLN
INTRAMUSCULAR | Status: AC
  Filled 2019-12-23: qty 4

## 2019-12-23 MED ORDER — PHENYLEPHRINE HCL-NACL 10-0.9 MG/250ML-% IV SOLN
0.0000 ug/min | INTRAVENOUS | Status: DC
  Administered 2019-12-23: 08:00:00 60 ug/min via INTRAVENOUS
  Administered 2019-12-23: 150 ug/min via INTRAVENOUS
  Administered 2019-12-23: 13:00:00 120 ug/min via INTRAVENOUS
  Administered 2019-12-23: 50 ug/min via INTRAVENOUS
  Administered 2019-12-23: 14:00:00 120 ug/min via INTRAVENOUS
  Filled 2019-12-23: qty 500
  Filled 2019-12-23: qty 750
  Filled 2019-12-23: qty 500
  Filled 2019-12-23: qty 250

## 2019-12-23 MED ORDER — POTASSIUM CHLORIDE 20 MEQ/15ML (10%) PO SOLN
40.0000 meq | Freq: Two times a day (BID) | ORAL | Status: AC
  Administered 2019-12-23: 40 meq

## 2019-12-23 MED ORDER — MIDAZOLAM 50MG/50ML (1MG/ML) PREMIX INFUSION
20.0000 mg/h | INTRAVENOUS | Status: DC
  Administered 2019-12-23: 22:00:00 20 mg/h via INTRAVENOUS
  Filled 2019-12-23 (×2): qty 50

## 2019-12-23 MED ORDER — SODIUM CHLORIDE 0.9 % IV SOLN
60.0000 mg/h | INTRAVENOUS | Status: DC
  Administered 2019-12-23: 02:00:00 60 mg/h via INTRAVENOUS
  Filled 2019-12-23 (×2): qty 50

## 2019-12-23 MED ORDER — VALPROATE SODIUM 500 MG/5ML IV SOLN
1900.0000 mg | Freq: Once | INTRAVENOUS | Status: AC
  Administered 2019-12-23: 01:00:00 1900 mg via INTRAVENOUS
  Filled 2019-12-23: qty 19

## 2019-12-23 MED ORDER — SODIUM BICARBONATE 8.4 % IV SOLN
50.0000 meq | Freq: Once | INTRAVENOUS | Status: AC
  Administered 2019-12-23: 50 meq via INTRAVENOUS
  Filled 2019-12-23: qty 50

## 2019-12-23 MED ORDER — PHENYLEPHRINE CONCENTRATED 100MG/250ML (0.4 MG/ML) INFUSION SIMPLE
0.0000 ug/min | INTRAVENOUS | Status: DC
  Administered 2019-12-23: 160 ug/min via INTRAVENOUS
  Filled 2019-12-23 (×2): qty 250

## 2019-12-23 MED ORDER — SODIUM CHLORIDE 0.9 % IV SOLN
90.0000 mg/h | INTRAVENOUS | Status: DC
  Administered 2019-12-23: 90 mg/h via INTRAVENOUS
  Filled 2019-12-23: qty 50
  Filled 2019-12-23 (×2): qty 100
  Filled 2019-12-23: qty 50

## 2019-12-23 MED ORDER — CALCIUM GLUCONATE-NACL 1-0.675 GM/50ML-% IV SOLN
1.0000 g | Freq: Once | INTRAVENOUS | Status: AC
  Administered 2019-12-23: 18:00:00 1000 mg via INTRAVENOUS
  Filled 2019-12-23: qty 50

## 2019-12-23 MED ORDER — DEXTROSE 5 % IV SOLN
3.0000 mg/kg | INTRAVENOUS | Status: DC
  Administered 2019-12-24 – 2019-12-25 (×2): 270 mg via INTRAVENOUS
  Filled 2019-12-23 (×3): qty 67.5

## 2019-12-23 MED ORDER — FOLIC ACID 1 MG PO TABS
1.0000 mg | ORAL_TABLET | Freq: Every day | ORAL | Status: DC
  Administered 2019-12-23 – 2019-12-25 (×3): 1 mg
  Filled 2019-12-23 (×3): qty 1

## 2019-12-23 MED ORDER — POTASSIUM CHLORIDE 20 MEQ/15ML (10%) PO SOLN
40.0000 meq | ORAL | Status: DC
  Administered 2019-12-23: 40 meq via ORAL
  Filled 2019-12-23: qty 30

## 2019-12-23 MED ORDER — RIFAXIMIN 550 MG PO TABS
550.0000 mg | ORAL_TABLET | Freq: Two times a day (BID) | ORAL | Status: DC
  Administered 2019-12-23 – 2019-12-25 (×5): 550 mg
  Filled 2019-12-23 (×5): qty 1

## 2019-12-23 MED ORDER — SODIUM CHLORIDE 0.9 % IV SOLN
70.0000 mg/h | INTRAVENOUS | Status: DC
  Filled 2019-12-23: qty 50

## 2019-12-23 MED ORDER — LACTULOSE 10 GM/15ML PO SOLN
30.0000 g | Freq: Three times a day (TID) | ORAL | Status: DC
  Administered 2019-12-23 – 2019-12-25 (×7): 30 g
  Filled 2019-12-23 (×7): qty 45

## 2019-12-23 MED ORDER — SPIRONOLACTONE 25 MG PO TABS
50.0000 mg | ORAL_TABLET | Freq: Two times a day (BID) | ORAL | Status: DC
  Administered 2019-12-23 – 2019-12-25 (×5): 50 mg via NASOGASTRIC
  Filled 2019-12-23 (×4): qty 2

## 2019-12-23 MED ORDER — ALBUMIN HUMAN 25 % IV SOLN
25.0000 g | Freq: Four times a day (QID) | INTRAVENOUS | Status: AC
  Administered 2019-12-23 – 2019-12-24 (×4): 25 g via INTRAVENOUS
  Filled 2019-12-23 (×4): qty 100

## 2019-12-23 MED ORDER — INSULIN ASPART 100 UNIT/ML ~~LOC~~ SOLN
0.0000 [IU] | SUBCUTANEOUS | Status: DC
  Administered 2019-12-23 (×2): 2 [IU] via SUBCUTANEOUS
  Administered 2019-12-24: 3 [IU] via SUBCUTANEOUS
  Administered 2019-12-24: 04:00:00 5 [IU] via SUBCUTANEOUS
  Administered 2019-12-24 (×2): 2 [IU] via SUBCUTANEOUS
  Administered 2019-12-24 (×2): 3 [IU] via SUBCUTANEOUS
  Administered 2019-12-25 (×2): 5 [IU] via SUBCUTANEOUS

## 2019-12-23 MED ORDER — POTASSIUM CHLORIDE 20 MEQ/15ML (10%) PO SOLN
40.0000 meq | Freq: Two times a day (BID) | ORAL | Status: DC
  Filled 2019-12-23: qty 30

## 2019-12-23 MED ORDER — SODIUM CHLORIDE 0.9 % IV SOLN
80.0000 mg/h | INTRAVENOUS | Status: DC
  Administered 2019-12-23: 80 mg/h via INTRAVENOUS
  Filled 2019-12-23: qty 50

## 2019-12-23 MED ORDER — POTASSIUM CHLORIDE 10 MEQ/50ML IV SOLN
10.0000 meq | INTRAVENOUS | Status: AC
  Administered 2019-12-23 (×4): 10 meq via INTRAVENOUS
  Filled 2019-12-23 (×4): qty 50

## 2019-12-23 MED ORDER — FENTANYL CITRATE (PF) 100 MCG/2ML IJ SOLN
INTRAMUSCULAR | Status: AC
  Filled 2019-12-23: qty 2

## 2019-12-23 MED ORDER — MAGNESIUM OXIDE 400 (241.3 MG) MG PO TABS
200.0000 mg | ORAL_TABLET | Freq: Every day | ORAL | Status: DC
  Administered 2019-12-23 – 2019-12-25 (×3): 200 mg
  Filled 2019-12-23 (×2): qty 1

## 2019-12-23 MED ORDER — VALPROATE SODIUM 500 MG/5ML IV SOLN
15.0000 mg/kg/d | Freq: Three times a day (TID) | INTRAVENOUS | Status: DC
  Administered 2019-12-23 – 2019-12-24 (×6): 458 mg via INTRAVENOUS
  Filled 2019-12-23 (×8): qty 4.58

## 2019-12-23 MED ORDER — PROPOFOL BOLUS VIA INFUSION
1.0000 mg/kg | Freq: Once | INTRAVENOUS | Status: AC
  Administered 2019-12-23: 02:00:00 100 mg via INTRAVENOUS
  Filled 2019-12-23: qty 100

## 2019-12-23 MED ORDER — STERILE WATER FOR INJECTION IV SOLN
INTRAVENOUS | Status: DC
  Filled 2019-12-23 (×11): qty 850

## 2019-12-23 MED ORDER — NOREPINEPHRINE 4 MG/250ML-% IV SOLN
INTRAVENOUS | Status: AC
  Administered 2019-12-23: 4 mg
  Filled 2019-12-23: qty 250

## 2019-12-23 MED ORDER — FENTANYL 2500MCG IN NS 250ML (10MCG/ML) PREMIX INFUSION
50.0000 ug/h | INTRAVENOUS | Status: DC
  Administered 2019-12-23: 11:00:00 50 ug/h via INTRAVENOUS
  Filled 2019-12-23: qty 250

## 2019-12-23 MED ORDER — MIDAZOLAM BOLUS VIA INFUSION
10.0000 mg | Freq: Once | INTRAVENOUS | Status: AC
  Administered 2019-12-23: 02:00:00 10 mg via INTRAVENOUS

## 2019-12-23 MED ORDER — SODIUM CHLORIDE 0.9 % IV SOLN: INTRAVENOUS | Status: DC | PRN

## 2019-12-23 MED ORDER — NOREPINEPHRINE 4 MG/250ML-% IV SOLN
0.0000 ug/min | INTRAVENOUS | Status: DC
  Administered 2019-12-23: 2 ug/min via INTRAVENOUS

## 2019-12-23 MED ORDER — POTASSIUM CHLORIDE 20 MEQ/15ML (10%) PO SOLN: 40.0000 meq | ORAL | Status: DC

## 2019-12-23 MED ORDER — SPIRONOLACTONE 25 MG PO TABS: 50.0000 mg | ORAL_TABLET | Freq: Two times a day (BID) | ORAL | Status: DC

## 2019-12-23 MED ORDER — MIDAZOLAM BOLUS VIA INFUSION
10.0000 mg | Freq: Once | INTRAVENOUS | Status: AC
  Administered 2019-12-23: 22:00:00 10 mg via INTRAVENOUS

## 2019-12-23 MED ORDER — ENOXAPARIN SODIUM 40 MG/0.4ML ~~LOC~~ SOLN
40.0000 mg | SUBCUTANEOUS | Status: DC
  Administered 2019-12-24 – 2019-12-25 (×2): 40 mg via SUBCUTANEOUS
  Filled 2019-12-23 (×2): qty 0.4

## 2019-12-23 MED ORDER — INSULIN ASPART 100 UNIT/ML ~~LOC~~ SOLN: 0.0000 [IU] | SUBCUTANEOUS | Status: DC

## 2019-12-23 MED ORDER — PRO-STAT SUGAR FREE PO LIQD
60.0000 mL | Freq: Three times a day (TID) | ORAL | Status: DC
  Administered 2019-12-23 – 2019-12-24 (×3): 60 mL
  Filled 2019-12-23 (×4): qty 60

## 2019-12-23 MED ORDER — FLUCYTOSINE 500 MG PO CAPS
25.0000 mg/kg | ORAL_CAPSULE | Freq: Four times a day (QID) | ORAL | Status: DC
  Filled 2019-12-23 (×2): qty 1

## 2019-12-23 NOTE — Progress Notes (Signed)
EEG reviewed with no definite electrographic seizures, but intermittent runs of periodic low amplitude sharp wave activity is seen in the left temporal region. No twitching of face or limbs.  BP is 99/47 with MAP of 62. Neosynephrine was increased to a rate of 50 five minutes ago.   Plan: -- Bolusing additional Versed 10 mg IV and increasing Versed gtt to 80 mg/hr.  -- Continuing propofol gtt at 160.  -- Continue to titrate neosynephrine to MAP goal of > 65.   Electronically signed: Dr. Kerney Elbe

## 2019-12-23 NOTE — Progress Notes (Signed)
1700- Patients blood pressure dropped significantly. Neo titrated to max, MD aware. New orders for ART line to be placed and propofol to be turned off at this time. Will resume when blood pressure more stable. Will continue to monitor. Lianne Bushy RN BSN.

## 2019-12-23 NOTE — Progress Notes (Signed)
EEG reviewed at the bedside. Intermittent runs of periodic low amplitude sharp wave activity now resolved, but EEG not fully suppressed.   Exam reveals no twitching of face or limbs.  Plan: -- Bolusing additional Versed 10 mg IV and increasing Versed gtt to 90 mg/hr.  -- Continuing propofol gtt at 160. -- Continue to titrate neosynephrine to MAP goal of > 65.   Additional 10 minutes of critical care time.   Electronically signed: Dr. Kerney Elbe

## 2019-12-23 NOTE — Procedures (Signed)
Lumbar Puncture Procedure Note  Pre-operative Diagnosis: AIDS, AMS  Post-operative Diagnosis: Same  Indications: Diagnostic  Procedure Details   Consent: Informed consent was obtained. Risks of the procedure were discussed including: infection, bleeding, pain and headache.  The patient was positioned under sterile conditions. Betadine solution and sterile drapes were utilized. A spinal needle was inserted at the L2 - L3 interspace.  Spinal fluid was obtained and sent to the laboratory.  Findings 70mL of slightly xanthochromic spinal fluid was obtained. Opening Pressure: 28 cm H2O pressure.  Complications:  None; patient tolerated the procedure well.        Condition: stable

## 2019-12-23 NOTE — Progress Notes (Signed)
No further twitching of face or limbs. EEG reviewed with no definite electrographic seizures, but intermittent runs of periodic low amplitude sharp wave activity is seen in the left temporal region.   Plan: -- Bolusing Versed 10 mg IV and increasing Versed gtt to 70 mg/hr.  -- Continuing propofol gtt at 160.   Electronically signed: Dr. Kerney Elbe

## 2019-12-23 NOTE — Procedures (Signed)
Arterial Catheter Insertion Procedure Note Justin Ball [id number] Feb 12, 1991  Procedure: Insertion of Arterial Catheter  Indications: Blood pressure monitoring  Procedure Details Consent: Unable to obtain consent because of emergent medical necessity. Time Out: Verified patient identification, verified procedure, site/side was marked, verified correct patient position, special equipment/implants available, medications/allergies/relevent history reviewed, required imaging and test results available.  Performed  Maximum sterile technique was used including antiseptics, gloves, hand hygiene, mask and sheet. Skin prep: Chlorhexidine; local anesthetic administered 20 gauge catheter was inserted into right femoral artery using the Seldinger technique. ULTRASOUND GUIDANCE USED: YES Evaluation Blood flow good; BP tracing good. Complications: No apparent complications.   Minda Ditto 12/23/2019

## 2019-12-23 NOTE — Progress Notes (Signed)
Initial Nutrition Assessment  RD working remotely.  DOCUMENTATION CODES:   Not applicable  INTERVENTION:   Tube feeding: - Vital High Protein @ 20 ml/hr (480 ml/day) via OG tube - Pro-stat 60 ml TID  Tube feeding regimen provides 1080 kcal, 132 grams of protein, and 401 ml of H2O.   Tube feeding regimen and current propofol provides 3401 total kcal (>100% of needs).  NUTRITION DIAGNOSIS:   Inadequate oral intake related to inability to eat as evidenced by NPO status.  GOAL:   Patient will meet greater than or equal to 90% of their needs  MONITOR:   Vent status, Labs, Weight trends, TF tolerance  REASON FOR ASSESSMENT:   Ventilator, Consult Enteral/tube feeding initiation and management  ASSESSMENT:   28 year old male who presented to the ED on 12/26 with headache. 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   12/28 - status epilepticus requiring emergent intubation 12/29 - s/p LP  RD consulted for TF initiation and management. MD ordered Adult ICU Tube Feeding Protocol. RD to adjust.  Pt now undergoing burst suppression.  Per CCM, appears that pt has cryptococcal meningitis.  OG tube in place per x-ray.  Patient is currently intubated on ventilator support MV: 10.1 L/min Temp (24hrs), Avg:98 F (36.7 C), Min:97.5 F (36.4 C), Max:98.7 F (37.1 C) BP (cuff): 90/48 MAP (cuff): 61  Drips: Propofol: 87.9 ml/hr (provides 2321 kcal daily from lipid) Versed: 90 ml/hr Neosynephrine: 90 ml/hr  Medications reviewed and include: decadron, Pepcid, folic acid, SSI q 4 hours, lactulose, magnesium oxide, liquid MVI, IV dilantin, spironolactone, thiamine, IV abx, Keppra  Labs reviewed: ionized calcium 1.06, magesium 2.8, elevated LFTs, hemoglobin 9.8 CBG's: 77-155  UOP: 1925 ml x 24 hours  NUTRITION - FOCUSED PHYSICAL EXAM:  Unable to  complete at this time. RD working remotely.  Diet Order:   Diet Order            Diet NPO time specified  Diet effective now              EDUCATION NEEDS:   No education needs have been identified at this time  Skin:  Skin Assessment: Reviewed RN Assessment  Last BM:  12/22/19 large type 6  Height:   Ht Readings from Last 1 Encounters:  Dec 28, 2019 6\' 1"  (1.854 m)    Weight:   Wt Readings from Last 1 Encounters:  12/23/19 92.1 kg    Ideal Body Weight:  83.6 kg  BMI:  Body mass index is 26.79 kg/m.  Estimated Nutritional Needs:   Kcal:  2165  Protein:  120-140 grams  Fluid:  >/= 2.0 L    Gaynell Face, MS, RD, LDN Inpatient Clinical Dietitian Pager: [phone] Weekend/After Hours: [phone]

## 2019-12-23 NOTE — Progress Notes (Signed)
Reassessed from 12:02 - 12:12 AM. Still with clinical seizure activity, but significantly improved with only left lower quadrant facial twitching seen. EEG still with electrographic seizure activity but decreased width of epileptiform discharges.   A/R: 28 year old male with HIV and probable CNS infection, on empiric ABX, antifungal and antivirual treatment, currently in status epilepticus on Versed gtt, propofol gtt, Dilantin and Keppra.  1. Increasing propofol gtt to a rate of 120 mcg/kg/min.  2. Continue Versed gtt at 50 mg/hr 3. Continue titratable neosynephrine. CCM also is following.  4. Continue Dilantin and Keppra.  5. Loading with valproic acid 20 mg/kg, followed by scheduled dosing at 5 mg/kg IV TID.  Reassessed prior to valproic acid load. No longer with face or limb twitching, but with rhythmic low amplitude broad based electrographic discharges occurring approximately at 1 Hz with superimposed sharp components.   15 minutes of critical care time.   Electronically signed: Dr. Kerney Elbe

## 2019-12-23 NOTE — Consult Note (Signed)
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  Patient also noted to have seizure-like activity.  Hospital course complicated on 12/22/2019 by refractory status epilepticus requiring intubation and transfer to the intensive care unit.  History is per chart review as patient is obtunded on ventilator.  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 Since the study of 05/25/2017, there has been marked increase in the size of the lateral and third ventricles. Compared to the 01/08/2019 study, there is increased ballooning of the atria of the lateral ventricles and slightly increased dilatation of the temporal horns. Otherwise, any progressive dilatation is subtle.  12/22/19 CD4 89  Micro Data:  MRSA PCR positive Blood cx NGTD CSF>>>  Antimicrobials:  12/27 onward Acyclovir Amphotericin Flucytosine Rifaxamin Bactrim ppx Vancomycin Ceftriaxone Dexamethasone  Interim history/subjective:  Seen in ICU  Objective   Blood pressure (!) 99/47, pulse 81, temperature 97.8 F (36.6 C), temperature source Axillary, resp. rate 16, height 6\' 1"  (1.854 m), weight 92.1 kg, SpO2 99 %.    Vent Mode: PRVC FiO2 (%):  [40 %-100 %] 40 % Set Rate:  [16 bmp] 16 bmp Vt Set:  [540 mL-640 mL] 640 mL PEEP:  [5 cmH20] 5 cmH20 Plateau Pressure:  [20 cmH20-21 cmH20] 21 cmH20   Intake/Output Summary (Last 24 hours) at 12/23/2019 12/21/2019 Last data filed at 12/23/2019 0800 Gross per 24 hour  Intake 7109.61 ml  Output 2000 ml  Net 5109.61 ml   Filed Weights   12-31-2019 2300 12/23/19 0500  Weight: 91.6 kg 92.1 kg    Examination: General: Obtunded young man on ventilator HENT: Endotracheal tube in place minimal secretions Lungs: Lung sounds are clear, no accessory muscle use Cardiovascular: Heart sounds regular, extremities warm Abdomen: Soft, +BS Extremities: No edema Neuro: GCS3 GU: foley in place  Resolved Hospital Problem list   N/A  Assessment & Plan:  # Encephalopathy and status epilepticus-some combination of worsening hydrocephalus, meningeal encephalitis, ictal and postictal state.  Now undergoing bur suppression by neurology. #  Acute hypoxemic respiratory failure secondary to inability to protect airway # AIDS/HIV noncompliant  with ART # Combination of baseline HIV dementia, hepatic encephalopathy, and vascular dementia # Cirrhosis from hepatitis C, hepatitis B, alcohol abuse  - LP, usual labs sent - Continue empiric and prophylactic antimicrobials as detailed above, narrowing per infectious disease - AEDs per neurology, will add low dose fentanyl drip, on VEEG - Check daily TG while on this high of a propofol dose - Replete K - Agree seems like cryptococcal meningitis, may need periodic therapeutic LPs  Best practice:  Diet: Start TF Pain/Anxiety/Delirium protocol (if indicated): N/A, on high doses versed/propofol, added low dose fentannyl VAP protocol (if indicated): In place DVT prophylaxis: Lovenox to resume 12/23/19 GI prophylaxis: Start pepcid Glucose control: SSI Mobility: BR Code Status: Full Family Communication: Called mother today, will call daily Disposition: ICU pending vent liberation  Labs   CBC: Recent Labs  Lab 12/10/2019 1516 12/21/19 0533 12/21/19 1606 12/22/19 0729 12/22/19 2336 12/22/19 2339  WBC 4.7 5.1 7.8 12.8*  --  12.3*  NEUTROABS 3.0  --   --   --   --   --   HGB 14.4 11.9* 11.6* 12.1* 10.9* 9.8*  HCT 41.0 33.6* 32.7* 34.6* 32.0* 27.6*  MCV 94.5 93.6 93.2 94.5  --  93.2  PLT 90* 80* 97* 103*  --  109*    Basic Metabolic Panel: Recent Labs  Lab 12/22/2019 1516 12/21/19 0533 12/21/19 1606 12/22/19 0729 12/22/19 2336 12/22/19 2339  NA 138 137 137 137 146* 142  K 2.9* 3.2* 3.3* 4.3 3.3* 3.5  CL 107 110 111 109  --  114*  CO2 22 18* 17* 16*  --  15*  GLUCOSE 83 99 109* 135*  --  136*  BUN 10 11 12 14   --  20  CREATININE 1.64* 1.34* 1.46* 1.48*  --  1.99*  CALCIUM 8.9 7.7* 8.0* 8.0*  --  7.7*  MG  --  1.7 1.8 2.4  --  2.8*  PHOS  --   --  2.2*  --   --   --    GFR: Estimated Creatinine Clearance: 62.5 mL/min (A) (by C-G formula based on SCr of 1.99 mg/dL  (H)). Recent Labs  Lab 11/26/2019 1516 12/17/2019 1941 12/21/19 0533 12/21/19 1606 12/22/19 0729 12/22/19 2339  WBC 4.7  --  5.1 7.8 12.8* 12.3*  LATICACIDVEN 1.1 5.8* 1.5  --   --   --     Liver Function Tests: Recent Labs  Lab 12/12/2019 1947 12/21/19 0533 12/21/19 1606 12/22/19 0729 12/22/19 2339  AST 148* 113* 109* 129* 98*  ALT 54* 51* 49* 57* 50*  ALKPHOS 106 95 93 93 76  BILITOT 9.2* 8.4* 8.0* 7.2* 6.0*  PROT 9.8* 9.1* 9.4* 9.4* 8.1  ALBUMIN 2.0* 1.8* 1.9* 1.9* 1.6*   Recent Labs  Lab 12/16/2019 1947  LIPASE 39   Recent Labs  Lab 12/24/2019 1947 12/22/19 2006  AMMONIA 154* <9*    ABG    Component Value Date/Time   PHART 7.334 (L) 12/22/2019 2336   PCO2ART 31.5 (L) 12/22/2019 2336   PO2ART 413.0 (H) 12/22/2019 2336   HCO3 16.9 (L) 12/22/2019 2336   TCO2 18 (L) 12/22/2019 2336   ACIDBASEDEF 8.0 (H) 12/22/2019 2336   O2SAT 100.0 12/22/2019 2336     Coagulation Profile: Recent Labs  Lab 12/05/2019 1947 12/21/19 0533 12/21/19 1606  INR 1.5* 1.5* 1.7*    Cardiac Enzymes: Recent Labs  Lab 12/21/19 0533  CKTOTAL 191    HbA1C: No results found for: HGBA1C  CBG:  Recent Labs  Lab 12/17/2019 1956 12/21/19 0752 12/23/19 0809  GLUCAP 77 91 155*    Review of Systems:   Cannot assess: obtunded  Past Medical History  He,  has a past medical history of Aggressive behavior (06/02/2019), AIDS due to HIV-I [facility]) (infectious disease--- dr Zenaida [place]), AKI (acute kidney injury) (HCC) (06/19/2019), Alcoholic hepatitis (01/14/2019), Anxiety, 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   cryptococcal cerebral infection), Bipolar 1 disorder (HCC), Chronic hepatitis C with hepatic coma (HCC), Chronic viral hepatitis B without coma and with delta agent (HCC), Cirrhosis of liver with ascites (HCC), Cognitive communication deficit, 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), Depression, 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),  Encephalopathy, hepatic (HCC) (06/26/2019), Fecal incontinence (11/19/2017), Foley catheter in place, Frontal lobe and executive function deficit following cerebral infarction (05/20/2017), History of cerebral infarction (05/20/2017), History of scabies (2013), History of syphilis, Memory deficit after cerebral infarction, Nausea and vomiting (01/14/2019), Neurogenic bladder, Neurogenic bowel, Neuromuscular disorder (HCC), 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   Surgical History    Past Surgical History:  Procedure Laterality Date  . CYSTOSCOPY N/A 10/10/2017   Procedure: CYSTOSCOPY;  Surgeon: Sebastian Ache, MD;  Location: [facility];  Service: Urology;  Laterality: N/A;  . EXAMINATION UNDER ANESTHESIA N/A 12/28/2014   Procedure: EXAM UNDER ANESTHESIA;  Surgeon: Karie Soda, MD;  Location: [facility];  Service: General;  Laterality: N/A;  . FLOOR OF MOUTH BIOPSY N/A 09/16/2016   Procedure: BIOPSY OF ORAL ABCESS;  Surgeon: Newman Pies, MD;  Location: [facility];  Service: ENT;  Laterality: N/A;  . INCISION AND DRAINAGE ABSCESS N/A 09/16/2016   Procedure: INCISION AND DRAINAGE ABSCESS;  Surgeon: Newman Pies, MD;  Location: [facility];  Service: ENT;  Laterality: N/A;  . INCISION AND DRAINAGE PERIRECTAL ABSCESS  09/ 03/ 2011   dr toth  . INCISION AND DRAINAGE PERIRECTAL ABSCESS N/A 12/28/2014   Procedure: IRRIGATION AND DEBRIDEMENT PERIRECTAL ABSCESS;  Surgeon: Karie Soda, MD;  Location: [facility];  Service: General;  Laterality: N/A;  Fistula repair and ablation  . INSERTION OF SUPRAPUBIC CATHETER N/A 10/10/2017   Procedure: INSERTION OF SUPRAPUBIC CATHETER;  Surgeon: Sebastian Ache, MD;  Location: [facility];  Service: Urology;  Laterality: N/A;  . LAPAROSCOPIC REVISION VENTRICULAR-PERITONEAL (V-P) SHUNT N/A 06/12/2017   Procedure: LAPAROSCOPIC REVISION VENTRICULAR-PERITONEAL (V-P)  SHUNT;  Surgeon: Kinsinger, De Blanch, MD;  Location: [facility];  Service: General;  Laterality: N/A;  . SHUNT REMOVAL N/A 01/01/2018   Procedure: REMOVAL OF V-P SHUNT, PLACEMENT OF EVD;  Surgeon: [name], MD;  Location: [facility];  Service: Neurosurgery;  Laterality: N/A;  . TRANSTHORACIC ECHOCARDIOGRAM  06/02/2017   EF 65-70%/  trivial PR  . VENTRICULOPERITONEAL SHUNT N/A 06/12/2017   Procedure: SHUNT INSERTION VENTRICULAR-PERITONEAL;  Surgeon: [name], MD;  Location: [facility];  Service: Neurosurgery;  Laterality: N/A;     Social History   reports that he has been smoking cigarettes. He has a 2.00 pack-year smoking history. He has never used smokeless tobacco. He reports current alcohol use. He reports that he does not use drugs.   Family History   His family history includes Breast cancer in his maternal grandmother; Hypertension in an other family member.   Allergies Allergies  Allergen Reactions  . Acyclovir And Related Itching     Home Medications  Prior to Admission medications   Medication Sig Start Date End Date Taking? Authorizing  Provider  Darunavir-Cobicisctat-Emtricitabine-Tenofovir Alafenamide [facility](SYMTUZA) 800-150-200-10 MG TABS Take 1 tablet by mouth daily with breakfast. 03/25/19  Yes Daiva EvesVan Dam, Lisette Grinderornelius N, MD  diclofenac sodium (VOLTAREN) 1 % GEL Apply 2 g topically 4 (four) times daily as needed (bilateral ankle pain). 07/07/19  Yes Beola CordMelvin, Alexander, MD  dolutegravir (TIVICAY) 50 MG tablet Take 1 tablet (50 mg total) by mouth daily. 03/25/19  Yes Daiva EvesVan Dam, Lisette Grinderornelius N, MD  furosemide (LASIX) 20 MG tablet Take 1 tablet (20 mg total) by mouth daily as needed for fluid or edema (leg swellings). Take extra dose of potassium if you take this medication Patient taking differently: Take 20 mg by mouth every Monday, Wednesday, and Friday.  06/08/19 06/07/20 Yes Alessandra BevelsKamineni, Neelima, MD  levETIRAcetam (KEPPRA) 1000 MG tablet Take 1 tablet (1,000 mg total) by mouth 2 (two) times  daily. 03/25/19  Yes Daiva EvesVan Dam, Lisette Grinderornelius N, MD  ondansetron (ZOFRAN) 4 MG tablet Take 1 tablet (4 mg total) by mouth every 6 (six) hours as needed for nausea or vomiting. 08/21/19  Yes Agyei, Hermina Staggersbed K, MD  rifaximin (XIFAXAN) 550 MG TABS tablet Take 1 tablet (550 mg total) by mouth 2 (two) times daily. 06/26/19  Yes Iva BoopGessner, Carl E, MD  spironolactone (ALDACTONE) 50 MG tablet Take 1 tablet (50 mg total) by mouth 2 (two) times daily. 07/30/19  Yes Dellia Cloudoe, Benjamin, MD  valACYclovir (VALTREX) 1000 MG tablet Take 1 tablet (1,000 mg total) by mouth every Monday, Wednesday, and Friday. 06/09/19  Yes Alessandra BevelsKamineni, Neelima, MD  fluconazole (DIFLUCAN) 200 MG tablet Take 200 mg by mouth daily.    [provider]  lactulose (CHRONULAC) 10 GM/15ML solution Take 45 mLs (30 g total) by mouth 3 (three) times daily. 07/07/19   Beola CordMelvin, Alexander, MD  Magnesium Oxide 200 MG TABS Take 1 tablet (200 mg total) by mouth daily. Patient not taking: Reported on 07/04/2019 06/08/19   Alessandra BevelsKamineni, Neelima, MD  nicotine (NICODERM CQ - DOSED IN MG/24 HOURS) 21 mg/24hr patch Place 1 patch (21 mg total) onto the skin daily. Patient not taking: Reported on 12/21/2019 07/08/19   Beola CordMelvin, Alexander, MD  QUEtiapine (SEROQUEL) 25 MG tablet Take 1 tablet (25 mg total) by mouth 2 (two) times daily as needed (agitation). Patient taking differently: Take 25 mg by mouth every Monday, Wednesday, and Friday.  03/25/19   Randall HissVan Dam, [name], MD  sulfamethoxazole-trimethoprim (BACTRIM) 400-80 MG tablet Take 1 tablet by mouth daily. Patient not taking: Reported on 12/21/2019 06/05/19   Blanchard Kelchixon, Stephanie N, NP     Critical care time: 45 minutes not including separately billable procedures

## 2019-12-23 NOTE — Procedures (Addendum)
Patient Name: DVAUGHN FICKLE  MRN: [id number]  Epilepsy Attending: Lora Havens  Referring Physician/Provider: Dr Amie Portland Duration: 12/22/2019 1938 to 12/23/2019 1938  Patient history: 28 year old, HIV+ presented for persistent headache and had prolonged seizure today. EEG to evaluate for status epilepticus  Level of alertness: comatose  AEDs during EEG study: LEV, PHT, VPA, versed, propofol  Technical aspects: This EEG study was done with scalp electrodes positioned according to the 10-20 International system of electrode placement. Electrical activity was acquired at a sampling rate of 500Hz  and reviewed with a high frequency filter of 70Hz  and a low frequency filter of 1Hz . EEG data were recorded continuously and digitally stored.   DESCRIPTION: EEG initially showed frequent seizures which started with left sided jerking followed by whole body jerking, last seizure on 12/22/2019 at 2134. Concomitant EEG showed rhythmic 2-3hz  delta slowing in right posterior quadrant which evolved into spikes at 1hz  and involved both hemispheres. EEG also showed continuous generalized 3-5hz  theta-delta slowing admixed with 13-15hz  generalized beta activity.   Patient was then started on Versed and propofol after which EEG showed generalized background suppression.   At 1748 on 12/23/2019, Versed and propofol had to be turned off due to sudden hypotension.  After that EEG showed rhythmic 2 to 3 Hz delta activity with overriding fast activity in right posterior quadrant.  Multiple subclinical seizures were also seen, right posterior quadrant, last seizure at 1908 on 12/23/2019.  EEG also showed continuous generalized 2 to 3hz  delta slowing.  ABNORMALITY - Continuous slow, generalized - Focal convulsive status epilepticus, right posterior quadrant -Background suppression, generalized  IMPRESSION: This study  initially showed focal convulsive status epilepticus arising from right posterior  quadrant during which patient was noted to have left arm and leg jerking, last seizure on 12/22/2019 at 2134.  EEG also showed evidence of severe diffuse encephalopathy, non specific to etiology but likely secondary to sedation.   At 1748 on 12/23/2019, Versed and propofol had to be turned off due to sudden hypotension after which the subclinical seizures were seen arising from right posterior quadrant, last seizure at 1908 on 12/23/2019.  Dr. Kerney Elbe was notified when patient had subclinical seizures.Lora Havens

## 2019-12-23 NOTE — Progress Notes (Signed)
Pharmacy Antibiotic Note  Justin Ball is a 28 y.o. male admitted on 12-26-2019 with headache and seizure.  He has a history of HIV and cryptococcal meningitis.  Patient has been off his HIV meds for ~10 days PTA.  Pharmacy has been consulted to dose acyclovir, Ambisome and vancomycin for meningitis/encephalitis.  Patient has AKI with SCr up to 1.99 and vancomycin trough is elevated at 38 mcg/mL (goal 15-20 mcg/mL).  Afebrile, WBC 12.3, LA 1.5.  Plan: Change vanc to 1250mg  IV Q24H.  May need to dose per level if SCr continues to trend up. Continue CTX 2gm IV Q12H per MD Continue Amp 2gm IV Q4H per MD Continue Acyclovir 915mg  IV Q8H (~10 mg/kg) Continue Amphotericin B 270mg  IV Q24H (~3 mg/kg).  Dextrose flushes before/after dose. Flucytosine 2250mg  PT Q6H per MD Decadron 10mg  IV Q6H per MD Septra daily per MD for OI ppx Monitor renal fxn, clinical progress, lytes while on Ambisome, HSV PCR, PRN vanc level   Height: 6\' 1"  (185.4 cm) Weight: 203 lb 0.7 oz (92.1 kg) IBW/kg (Calculated) : 79.9  Temp (24hrs), Avg:98 F (36.7 C), Min:97.5 F (36.4 C), Max:98.7 F (37.1 C)  Recent Labs  Lab 12-26-19 1516 December 26, 2019 1941 12/21/19 0533 12/21/19 1606 12/22/19 0729 12/22/19 2339 12/23/19 1002  WBC 4.7  --  5.1 7.8 12.8* 12.3*  --   CREATININE 1.64*  --  1.34* 1.46* 1.48* 1.99*  --   LATICACIDVEN 1.1 5.8* 1.5  --   --   --   --   VANCOTROUGH  --   --   --   --   --   --  38*    Estimated Creatinine Clearance: 62.5 mL/min (A) (by C-G formula based on SCr of 1.99 mg/dL (H)).    Allergies  Allergen Reactions  . Acyclovir And Related Itching    Amphotericin B 12/26 >>  Flucytosine 12/27 >> Vanc 12/26 >>  Ampicillin 12/26>> CTX 12/26>> Acyclovir 12/26 >> Bactrim for PCP px 12/28 >>  12/29 VT = 38 mcg/mL on 1250mg  q12 (SCr 1.99) >> 1250mg  q24  12/26 BCx - NGTD 12/26 Resp PCR / covid - negative 12/28 MRSA PCR - negative  Kmarion Rawl D. Mina Marble, PharmD, BCPS, [place] 12/23/2019, 12:43  PM

## 2019-12-23 NOTE — Progress Notes (Signed)
Called by RN for continued twitching of left side of face. However, body jerks now are resolved.   Current BP is 100/54. MAP > 65. HR 94.   Recommendations: -- Bolusing 10 mg Versed and increasing drip to 60 mg/hr.  -- Continuing propofol gtt at 160.   Electronically signed: Dr. Kerney Elbe

## 2019-12-23 NOTE — Progress Notes (Signed)
Orders received from Dr Cheral Marker to give 10mg  versed bolus and increase versed gtt to 14ml/hr per EEG reading.

## 2019-12-23 NOTE — Progress Notes (Addendum)
Subjective:  On ventilator sedated   Antibiotics:  Anti-infectives (From admission, onward)   Start     Dose/Rate Route Frequency Ordered Stop   12/24/19 0800  vancomycin (VANCOREADY) IVPB 1250 mg/250 mL     1,250 mg 166.7 mL/hr over 90 Minutes Intravenous Every 24 hours 12/23/19 1332     12/24/19 0300  vancomycin (VANCOREADY) IVPB 1250 mg/250 mL  Status:  Discontinued     1,250 mg 166.7 mL/hr over 90 Minutes Intravenous Every 24 hours 12/23/19 1251 12/23/19 1332   12/23/19 1000  sulfamethoxazole-trimethoprim (BACTRIM) 200-40 MG/5ML suspension 10 mL     10 mL Per Tube Daily 12/22/19 2226     12/23/19 1000  rifaximin (XIFAXAN) tablet 550 mg     550 mg Per Tube 2 times daily 12/23/19 0926     12/23/19 0200  flucytosine (ANCOBON) capsule 2,250 mg  Status:  Discontinued     25 mg/kg  91.6 kg Per Tube Every 6 hours 12/23/19 0155 12/23/19 0159   12/23/19 0200  flucytosine (ANCOBON) capsule 2,250 mg     25 mg/kg  91.6 kg Per Tube Every 6 hours 12/23/19 0159     12/22/19 1300  sulfamethoxazole-trimethoprim (BACTRIM) 400-80 MG per tablet 1 tablet  Status:  Discontinued     1 tablet Oral Daily 12/22/19 1151 12/22/19 2226   12/22/19 0800  Darunavir-Cobicisctat-Emtricitabine-Tenofovir Alafenamide (SYMTUZA) 800-150-200-10 MG TABS 1 tablet  Status:  Discontinued     1 tablet Oral Daily with breakfast 12/21/19 1222 12/22/19 1304   12/21/19 1300  dolutegravir (TIVICAY) tablet 50 mg  Status:  Discontinued     50 mg Oral Daily 12/21/19 1248 12/22/19 1304   12/21/19 1200  vancomycin (VANCOREADY) IVPB 1250 mg/250 mL  Status:  Discontinued     1,250 mg 166.7 mL/hr over 90 Minutes Intravenous Every 12 hours 12/12/2019 2312 12/23/19 1251   12/21/19 0100  amphotericin B liposome (AMBISOME) 270 mg in dextrose 5 % 500 mL IVPB     3 mg/kg  91.6 kg 283.8 mL/hr over 120 Minutes Intravenous Every 24 hours 12/22/2019 2312     12/21/19 0100  rifaximin (XIFAXAN) tablet 550 mg  Status:  Discontinued       550 mg Oral 2 times daily 12/21/19 0057 12/23/19 0926   12/21/19 0045  acyclovir (ZOVIRAX) 915 mg in dextrose 5 % 150 mL IVPB     10 mg/kg  91.6 kg 168.3 mL/hr over 60 Minutes Intravenous Every 8 hours 12/21/19 0035     12/21/19 0000  ampicillin (OMNIPEN) 2 g in sodium chloride 0.9 % 100 mL IVPB     2 g 300 mL/hr over 20 Minutes Intravenous Every 4 hours 12/08/2019 2312     12/21/19 0000  flucytosine (ANCOBON) capsule 2,250 mg  Status:  Discontinued     25 mg/kg  91.6 kg Oral Every 6 hours 12/16/2019 2312 12/05/2019 2327   12/21/19 0000  vancomycin (VANCOREADY) IVPB 2000 mg/400 mL     2,000 mg 200 mL/hr over 120 Minutes Intravenous  Once 12/16/2019 2312 12/21/19 0521   12/21/19 0000  flucytosine (ANCOBON) capsule 2,250 mg  Status:  Discontinued     25 mg/kg  91.6 kg Oral Every 6 hours 12/11/2019 2327 12/23/19 0155   12/10/2019 2215  cefTRIAXone (ROCEPHIN) 2 g in sodium chloride 0.9 % 100 mL IVPB     2 g 200 mL/hr over 30 Minutes Intravenous Every 12 hours 12/24/2019 2214  Medications: Scheduled Meds: . chlorhexidine gluconate (MEDLINE KIT)  15 mL Mouth Rinse BID  . Chlorhexidine Gluconate Cloth  6 each Topical Q0600  . dexamethasone (DECADRON) injection  10 mg Intravenous Q6H  . [START ON 12/24/2019] enoxaparin (LOVENOX) injection  40 mg Subcutaneous Q24H  . famotidine  20 mg Per Tube Daily  . feeding supplement (PRO-STAT SUGAR FREE 64)  60 mL Per Tube TID  . feeding supplement (VITAL HIGH PROTEIN)  1,000 mL Per Tube Q24H  . flucytosine  25 mg/kg Per Tube J8I  . folic acid  1 mg Per Tube Daily  . insulin aspart  0-15 Units Subcutaneous Q4H  . lactulose  30 g Per Tube TID  . LORazepam  2 mg Intravenous Once  . LORazepam  2 mg Intravenous Once  . magnesium oxide  200 mg Per Tube Daily  . mouth rinse  15 mL Mouth Rinse 10 times per day  . multivitamin  15 mL Per Tube Daily  . nicotine  21 mg Transdermal Daily  . phenytoin (DILANTIN) IV  100 mg Intravenous Q8H  . potassium chloride   40 mEq Oral Q2H  . rifaximin  550 mg Per Tube BID  . sodium chloride  1,000 mL Intravenous Q24H  . sodium chloride  500 mL Intravenous Q24H  . spironolactone  50 mg Per NG tube BID  . sulfamethoxazole-trimethoprim  10 mL Per Tube Daily  . thiamine  100 mg Oral Daily   Or  . thiamine  100 mg Intravenous Daily   Continuous Infusions: . sodium chloride    . acyclovir Stopped (12/23/19 1124)  . albumin human    . amphotericin  B  Liposome (AMBISOME) ADULT IV Stopped (12/23/19 0407)  . ampicillin (OMNIPEN) IV Stopped (12/23/19 1145)  . cefTRIAXone (ROCEPHIN)  IV Stopped (12/23/19 1020)  . dextrose 0 mL (12/21/19 1220)  . dextrose 0 mL/hr at 12/22/19 1946  . fentaNYL infusion INTRAVENOUS 50 mcg/hr (12/23/19 1300)  . levETIRAcetam Stopped (12/23/19 0532)  . midazolam 90 mg/hr (12/23/19 1300)  . phenylephrine (NEO-SYNEPHRINE) Adult infusion 120 mcg/min (12/23/19 1425)  . propofol (DIPRIVAN) infusion 145 mcg/kg/min (12/23/19 1439)  . valproate sodium 458 mg (12/23/19 1349)  . [START ON 12/24/2019] vancomycin     PRN Meds:.acetaminophen **OR** acetaminophen, acetaminophen, diphenhydrAMINE **OR** diphenhydrAMINE, LORazepam, meperidine (DEMEROL) injection    Objective: Weight change:   Intake/Output Summary (Last 24 hours) at 12/23/2019 1528 Last data filed at 12/23/2019 1300 Gross per 24 hour  Intake 9167.73 ml  Output 1200 ml  Net 7967.73 ml   Blood pressure (!) 95/48, pulse 67, temperature 97.8 F (36.6 C), temperature source Axillary, resp. rate 16, height '6\' 1"'  (1.854 m), weight 92.1 kg, SpO2 99 %. Temp:  [97.5 F (36.4 C)-98.7 F (37.1 C)] 97.8 F (36.6 C) (12/29 0800) Pulse Rate:  [65-128] 67 (12/29 1516) Resp:  [14-30] 16 (12/29 1516) BP: (81-137)/(44-78) 95/48 (12/29 1516) SpO2:  [94 %-100 %] 99 % (12/29 1517) FiO2 (%):  [40 %-100 %] 40 % (12/29 1517) Weight:  [92.1 kg] 92.1 kg (12/29 0500)  Physical Exam: General: On ventilator and sedated. HEENT: anicteric  sclera, EOMI CVS normal rate Chest: on ventilator Abdomen: soft non-distended,  Extremities: no edema or deformity noted bilaterally Skin: diffuse rash on arms, legs Neuro: nonfocal  CBC:    BMET Recent Labs    12/22/19 2339 12/23/19 1252  NA 142 138  K 3.5 2.8*  CL 114* 112*  CO2 15* 12*  GLUCOSE 136* 129*  BUN 20 25*  CREATININE 1.99* 2.13*  CALCIUM 7.7* 6.9*     Liver Panel  Recent Labs    12/08/2019 1947 12/22/19 0729 12/22/19 2339  PROT 9.8* 9.4* 8.1  ALBUMIN 2.0* 1.9* 1.6*  AST 148* 129* 98*  ALT 54* 57* 50*  ALKPHOS 106 93 76  BILITOT 9.2* 7.2* 6.0*  BILIDIR 4.6*  --   --   IBILI 4.6*  --   --        Sedimentation Rate No results for input(s): ESRSEDRATE in the last 72 hours. C-Reactive Protein No results for input(s): CRP in the last 72 hours.  Micro Results: Recent Results (from the past 720 hour(s))  Respiratory Panel by RT PCR (Flu A&B, Covid) - Nasopharyngeal Swab     Status: None   Collection Time: 12/16/2019  2:40 AM   Specimen: Nasopharyngeal Swab  Result Value Ref Range Status   SARS Coronavirus 2 by RT PCR NEGATIVE NEGATIVE Final    Comment: (NOTE) SARS-CoV-2 target nucleic acids are NOT DETECTED. The SARS-CoV-2 RNA is generally detectable in upper respiratoy specimens during the acute phase of infection. The lowest concentration of SARS-CoV-2 viral copies this assay can detect is 131 copies/mL. A negative result does not preclude SARS-Cov-2 infection and should not be used as the sole basis for treatment or other patient management decisions. A negative result may occur with  improper specimen collection/handling, submission of specimen other than nasopharyngeal swab, presence of viral mutation(s) within the areas targeted by this assay, and inadequate number of viral copies (<131 copies/mL). A negative result must be combined with clinical observations, patient history, and epidemiological information. The expected result is  Negative. Fact Sheet for Patients:  PinkCheek.be Fact Sheet for Healthcare Providers:  GravelBags.it This test is not yet ap proved or cleared by the [place] FDA and  has been authorized for detection and/or diagnosis of SARS-CoV-2 by FDA under an Emergency Use Authorization (EUA). This EUA will remain  in effect (meaning this test can be used) for the duration of the COVID-19 declaration under Section 564(b)(1) of the Act, 21 U.S.C. section 360bbb-3(b)(1), unless the authorization is terminated or revoked sooner.    Influenza A by PCR NEGATIVE NEGATIVE Final   Influenza B by PCR NEGATIVE NEGATIVE Final    Comment: (NOTE) The Xpert Xpress SARS-CoV-2/FLU/RSV assay is intended as an aid in  the diagnosis of influenza from Nasopharyngeal swab specimens and  should not be used as a sole basis for treatment. Nasal washings and  aspirates are unacceptable for Xpert Xpress SARS-CoV-2/FLU/RSV  testing. Fact Sheet for Patients: PinkCheek.be Fact Sheet for Healthcare Providers: GravelBags.it This test is not yet approved or cleared by the [place] FDA and  has been authorized for detection and/or diagnosis of SARS-CoV-2 by  FDA under an Emergency Use Authorization (EUA). This EUA will remain  in effect (meaning this test can be used) for the duration of the  Covid-19 declaration under Section 564(b)(1) of the Act, 21  U.S.C. section 360bbb-3(b)(1), unless the authorization is  terminated or revoked. Performed at [facility], [place] [address], [place], [place] [address]   Blood culture (routine x 2)     Status: None (Preliminary result)   Collection Time: 12/18/2019  7:47 PM   Specimen: BLOOD  Result Value Ref Range Status   Specimen Description BLOOD LEFT ANTECUBITAL  Final   Special Requests   Final    BOTTLES DRAWN AEROBIC AND ANAEROBIC Blood Culture results  may not  be optimal due to an excessive volume of blood received in culture bottles   Culture   Final    NO GROWTH 3 DAYS Performed at [facility], [place] [address], [place], [place] [address]    Report Status PENDING  Incomplete  Blood culture (routine x 2)     Status: None (Preliminary result)   Collection Time: 12/14/2019  7:59 PM   Specimen: BLOOD RIGHT HAND  Result Value Ref Range Status   Specimen Description BLOOD RIGHT HAND  Final   Special Requests   Final    BOTTLES DRAWN AEROBIC AND ANAEROBIC Blood Culture adequate volume   Culture   Final    NO GROWTH 3 DAYS Performed at [facility], [place] [address] [place], [place] [address]    Report Status PENDING  Incomplete  MRSA PCR Screening     Status: None   Collection Time: 12/22/19 10:25 PM   Specimen: Nasopharyngeal  Result Value Ref Range Status   MRSA by PCR NEGATIVE NEGATIVE Final    Comment:        The GeneXpert MRSA Assay (FDA approved for NASAL specimens only), is one component of a comprehensive MRSA colonization surveillance program. It is not intended to diagnose MRSA infection nor to guide or monitor treatment for MRSA infections. Performed at [facility], [place] [address], [place], [place] [address]   CSF culture     Status: None (Preliminary result)   Collection Time: 12/23/19  9:08 AM   Specimen: CSF; Cerebrospinal Fluid  Result Value Ref Range Status   Specimen Description CSF  Final   Special Requests Immunocompromised  Final   Gram Stain   Final    WBC PRESENT, PREDOMINANTLY MONONUCLEAR NO ORGANISMS SEEN CYTOSPIN SMEAR Performed at [facility], [place] [address], [place], [place] [address]    Culture PENDING  Incomplete   Report Status PENDING  Incomplete    Studies/Results: DG Abd 1 View  Result Date: 12/22/2019 CLINICAL DATA:  OG tube placement EXAM: ABDOMEN - 1 VIEW COMPARISON:  None. FINDINGS: The enteric tube terminates over the gastric body. The visualized bowel gas  pattern is nonobstructive. IMPRESSION: OG tube terminates over the gastric body. Electronically Signed   By: Constance Holster M.D.   On: 12/22/2019 22:36   DG CHEST PORT 1 VIEW  Result Date: 12/23/2019 CLINICAL DATA:  Hypoxia.  Central catheter placement. EXAM: PORTABLE CHEST 1 VIEW COMPARISON:  December 22, 2019 FINDINGS: Endotracheal tube tip is at the carina. Central catheter tip is in the right atrium with the tip approximately 5 cm distal to the cavoatrial junction. Nasogastric tube tip and side port are below the diaphragm. No pneumothorax. No edema or consolidation. Heart is borderline enlarged with pulmonary vascularity normal. No adenopathy. No bone lesions. IMPRESSION: 1. Tube and catheter positions as described without pneumothorax. Note that the endotracheal tube tip is at the carina. Advise withdrawing endotracheal tube approximately 3 cm. 2.  Lungs clear. 3.  Borderline cardiac enlargement.  No adenopathy. These results will be called to the ordering clinician or representative by the Radiologist Assistant, and communication documented in the PACS or zVision Dashboard. Electronically Signed   By: Lowella Grip III M.D.   On: 12/23/2019 09:21   DG CHEST PORT 1 VIEW  Result Date: 12/22/2019 CLINICAL DATA:  Endotracheal tube placement EXAM: PORTABLE CHEST 1 VIEW COMPARISON:  July 04, 2019 FINDINGS: The endotracheal tube terminates at the carina. Repositioning is recommended. The lung volumes  are low. There is an enteric tube extends below the left hemidiaphragm. There is no pneumothorax. Bibasilar airspace opacities are noted, left worse than right. There is no acute osseous abnormality. No significant pleural effusion. IMPRESSION: 1. Endotracheal tube terminates at the carina. Repositioning is recommended. 2. Bibasilar airspace opacities, left worse than right. Findings may reflect atelectasis, aspiration, or pneumonia. 3. No pneumothorax.  Low lung volumes. These results will be called  to the ordering clinician or representative by the Radiologist Assistant, and communication documented in the PACS or zVision Dashboard. Electronically Signed   By: Constance Holster M.D.   On: 12/22/2019 22:36   EEG adult  Result Date: 12/22/2019 Lora Havens, MD     12/22/2019  7:56 PM Patient Name: MORDCHE HEDGLIN MRN: [id number] Epilepsy Attending: Lora Havens Referring Physician/Provider: Dr Modena Nunnery Date: 12/22/2019 Duration: 21.24 mins Patient history: 28 year old, HIV+ presented for persistent headache and had prolonged seizure today. EEG to evaluate for status epilepticus Level of alertness: comatose AEDs during EEG study: LEV, PHT, ativan Technical aspects: This EEG study was done with scalp electrodes positioned according to the 10-20 International system of electrode placement. Electrical activity was acquired at a sampling rate of '500Hz'  and reviewed with a high frequency filter of '70Hz'  and a low frequency filter of '1Hz' . EEG data were recorded continuously and digitally stored. DESCRIPTION: At the beginning of study, EEG showed continuous generalized 3-'5hz'  theta-delta slowing admixed with 13-'15hz'  generalized beta activity. One seizure was recorded at 1935 during which patient was noted to have left arm and leg jerking. Concomitant EEG showed rhythmic 2-'3hz'  delta slowing in right posterior quadrant which evolved into spikes at '1hz' . Seizure was waxing and waning but ongoing at the end of study at 1937. Hyperventilation and photic stimulation were not performed. ABNORMALITY - Continuous slow, generalized - Seizure, right posterior quadrant IMPRESSION: This study showed one seizure arising from right posterior quadrant during which patient was noted to have left arm and leg jerking, lasting over 2 minutes ( study ended while seizure was ongoing). There is also evidence of severe diffuse encephalopathy, non specific to etiology. Of note, per history patient has been having frequent left  sided jerking for prolonged period and is not back to baseline and therefore meets criteria for status epilepticus. Dr Cheral Marker was notified. Priyanka Barbra Sarks   Overnight EEG with video  Result Date: 12/23/2019 Lora Havens, MD     12/23/2019  9:53 AM Patient Name: DAMETRI OZBURN MRN: [id number] Epilepsy Attending: Lora Havens Referring Physician/Provider: Dr Amie [place] Duration: 12/22/2019 1938 to 12/23/2019 0900  Patient history: 28 year old, HIV+ presented for persistent headache and had prolonged seizure today. EEG to evaluate for status epilepticus  Level of alertness: comatose  AEDs during EEG study: LEV, PHT, VPA, versed, propofol  Technical aspects: This EEG study was done with scalp electrodes positioned according to the 10-20 International system of electrode placement. Electrical activity was acquired at a sampling rate of '500Hz'  and reviewed with a high frequency filter of '70Hz'  and a low frequency filter of '1Hz' . EEG data were recorded continuously and digitally stored.  DESCRIPTION: EEG showed frequent seizures which started with left sided jerking followed by whole body jerking, last seizure on 12/22/2019 at 2134. Concomitant EEG showed rhythmic 2-'3hz'  delta slowing in right posterior quadrant which evolved into spikes at '1hz'  and involved both hemispheres. EEG also showed continuous generalized 3-'5hz'  theta-delta slowing admixed with 13-'15hz'  generalized beta activity. ABNORMALITY - Continuous slow, generalized - Focal  convulsive status epilepticus, right posterior quadrant  IMPRESSION: This study showed focal convulsive status epilepticus arising from right posterior quadrant during which patient was noted to have left arm and leg jerking, last seizure on 12/22/2019 at 2134. There is also evidence of severe diffuse encephalopathy, non specific to etiology but likely secondary to sedation. Priyanka Barbra Sarks      Assessment/Plan:  INTERVAL HISTORY: Opening pressure was 28 and his  cryptococcal antigen was positive   Active Problems:   Nonintractable headache   Seizure ([place])   AMS (altered mental status)   Status epilepticus ([place])    Talin R Sultana is a 28 y.o. 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  I tried repeatedly to try to have [place] institutionalized so that he could be on consistent therapy but he and grandmother had decided against this.  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    Continue amphotericin and flucytosine  Discontinue other antibiotics  If he is going to survive he is going to need repeated lumbar punctures and possible repeat neurosurgical intervention  #2 HIV/AIDS: poorly adherent. 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     LOS: 2 days   Alcide Evener 12/23/2019, 3:28 PM

## 2019-12-23 NOTE — Progress Notes (Signed)
Called to bedside for recurrent clinical seizure activity.   Valproic acid IV load is almost complete.   Rhythmic left facial twitching is seen on exam, with correlating rhythmic epileptiform discharges on EEG.  Bolusing an additional 100 mg propofol and increasing propofol gtt to 160 mcg/kg/min.    10 additional minutes of critical care time.   Electronically signed: Dr. Kerney Elbe

## 2019-12-23 NOTE — Progress Notes (Signed)
[facility] Progress Note Patient Name: DAISUKE BAILEY DOB: 1991-09-01 MRN: [id number]   Date of Service  12/23/2019  HPI/Events of Note  PH 7.16 despite a Bicarb infusion going at 150 ml/hour  eICU Interventions  Sodium bicarbonate 100 meq iv push x 1        Bentlee Drier U Landri Dorsainvil 12/23/2019, 9:25 PM

## 2019-12-23 NOTE — Procedures (Signed)
Central Venous Catheter Insertion Procedure Note ZAHIR EISENHOUR [id number] [birth year]  Procedure: Insertion of Central Venous Catheter Indications: Drug and/or fluid administration  Procedure Details Consent: Risks of procedure as well as the alternatives and risks of each were explained to the (patient/caregiver).  Consent for procedure obtained. Time Out: Verified patient identification, verified procedure, site/side was marked, verified correct patient position, special equipment/implants available, medications/allergies/relevent history reviewed, required imaging and test results available.  Performed  Maximum sterile technique was used including antiseptics, cap, gloves, gown, hand hygiene, mask and sheet. Skin prep: Chlorhexidine; local anesthetic administered A antimicrobial bonded/coated triple lumen catheter was placed in the left internal jugular vein using the Seldinger technique under US guidance.  Evaluation Blood flow good Complications: No apparent complications Patient did tolerate procedure well. Chest X-ray ordered to verify placement.  CXR: pending.  Candee Furbish 12/23/2019, 8:52 AM

## 2019-12-23 NOTE — Progress Notes (Signed)
vLTM  Maintenance  No skin breakdown at FP1  FP2  A1  A2 sites

## 2019-12-23 NOTE — Progress Notes (Signed)
Called for hypotension, sudden worsening. Improved starting levophed, bicarb/calcium push, and turning off propofol and versed. Discussed with Dr. Hortense Ramal, okay to try lower doses of these sedating meds. VBG with mostly metabolic acidosis Bedside US with slightly enlarged RV but does not look like acute PE Also more oliguric  Plan - Greatly reduce sedation meds, continue VEEG - Bicarb drip, DC NS - LE dopplers - Increase RR, check ABG, CXR - Art line placed by NP - Switch to norepinephrine from phenylephrine - Grim prognosis, reviewed ID note, palliative consult placed  Erskine Emery MD PCCM

## 2019-12-23 NOTE — Progress Notes (Signed)
Verbal order from Dr. Tamala Julian, versed to be restarted at 10 mg and propofol 33mcg. Dr. Hortense Ramal aware and agrees with this. Lianne Bushy RN BSN.

## 2019-12-23 NOTE — Progress Notes (Signed)
Order from Dr. Hortense Ramal to titrate propfol down by 41mcg every hour. Please see eMAR for propofol titration. Lianne Bushy RN BSN.

## 2019-12-23 NOTE — Progress Notes (Addendum)
Reason for consult: Status epilepticus  Subjective: Last seizure on 12/14/2019 at 04/25/33.  Patient was loaded with valproic acid and started on Versed and propofol for better suppression.  No further seizures seen since then.  Also had lumbar puncture this morning.   ROS: unable to obtain due to poor mental status  Examination  Vital signs in last 24 hours: Temp:  [97.5 F (36.4 C)-98.7 F (37.1 C)] 97.8 F (36.6 C) (12/29 0800) Pulse Rate:  [68-128] 68 (12/29 1127) Resp:  [16-30] 16 (12/29 1127) BP: (81-137)/(44-78) 81/44 (12/29 1127) SpO2:  [97 %-100 %] 100 % (12/29 1128) FiO2 (%):  [40 %-100 %] 40 % (12/29 1128) Weight:  [92.1 kg] 92.1 kg (12/29 0500)  General: lying in bed, not in apparent distress CVS: pulse-normal rate and rhythm RS: breathing comfortably, intubated Extremities: normal   Neuro: On propofol, Versed MS: Comatose, does not open eyes to noxious stimuli CN: pupils equal and reactive,  EOMI, no forced gaze deviation, no nystagmus, rest of the cranial nerves difficult to assess secondary intubation  Motor: Does not withdraw to noxious stimuli in any extremity Reflexes: 1+ in all extremities plantars: mute  Basic Metabolic Panel: Recent Labs  Lab 12/19/2019 1516 12/21/19 0533 12/21/19 1606 12/22/19 0729 12/22/19 2336 12/22/19 2339  NA 138 137 137 137 146* 142  K 2.9* 3.2* 3.3* 4.3 3.3* 3.5  CL 107 110 111 109  --  114*  CO2 22 18* 17* 16*  --  15*  GLUCOSE 83 99 109* 135*  --  136*  BUN 10 11 12 14   --  20  CREATININE 1.64* 1.34* 1.46* 1.48*  --  1.99*  CALCIUM 8.9 7.7* 8.0* 8.0*  --  7.7*  MG  --  1.7 1.8 2.4  --  2.8*  PHOS  --   --  2.2*  --   --   --     CBC: Recent Labs  Lab 12/07/2019 1516 12/21/19 0533 12/21/19 1606 12/22/19 0729 12/22/19 2336 12/22/19 2339  WBC 4.7 5.1 7.8 12.8*  --  12.3*  NEUTROABS 3.0  --   --   --   --   --   HGB 14.4 11.9* 11.6* 12.1* 10.9* 9.8*  HCT 41.0 33.6* 32.7* 34.6* 32.0* 27.6*  MCV 94.5 93.6 93.2 94.5  --   93.2  PLT 90* 80* 97* 103*  --  109*     Coagulation Studies: Recent Labs    12/16/2019 1946-04-25 12/21/19 0533 12/21/19 1606  LABPROT 18.3* 18.4* 19.4*  INR 1.5* 1.5* 1.7*    Imaging MRI brain without contrast 12/21/2019: Since the study of 05/25/2017, there has been marked increase in the size of the lateral and third ventricles. Compared to the 01/08/2019 study, there is increased ballooning of the atria of the lateral ventricles and slightly increased dilatation of the temporal horns. Otherwise, any progressive dilatation is subtle.    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   Status epilepticus HIV infection Suspected CNS infection -LTM EEG overnight showed status epilepticus with last seizure 04-25-2133.  No further seizures since. -Lumbar puncture on 12/23/2019, CSF results pending  Recommendations -Continue Keppra 1500 mg twice daily, Dilantin 100 mg 3 times daily, valproic acid 458 mg 3 times daily -Given patient's history of hepatitis B, hepatitis C and hepatic impairment, discussed with pharmacy about using valproic  acid.  As patient does not have severe hepatic impairment and valproic acid appeared to be successful in stopping his status epilepticus, we will continue it for a few days and then gradually wean it off. -We will also check valproic acid level, phenytoin level and liver function tests daily while patient is on valproic acid. -We will continue midazolam and propofol with goal for breath suppression for 24 hours.  If no further seizures overnight, will start weaning off tomorrow. -We will follow on CSF results, in the interim continue empiric antibiotics, antivirals and antifungals. -Continue seizure precautions -As needed IV Versed 5 mg for clinical seizures  CRITICAL CARE Performed by:  Charlsie Quest   Total critical care time:  Critical care time was exclusive of separately billable procedures and treating other patients.  Critical care was necessary to treat or prevent imminent or life-threatening deterioration.  Critical care was time spent personally by me on the following activities: development of treatment plan with patient and/or surrogate as well as nursing, discussions with consultants, evaluation of patient's response to treatment, examination of patient, obtaining history from patient or surrogate, ordering and performing treatments and interventions, ordering and review of laboratory studies, ordering and review of radiographic studies, pulse oximetry and re-evaluation of patient's condition.

## 2019-12-24 ENCOUNTER — Inpatient Hospital Stay (HOSPITAL_COMMUNITY): Payer: Medicare Other

## 2019-12-24 DIAGNOSIS — I2699 Other pulmonary embolism without acute cor pulmonale: Secondary | ICD-10-CM

## 2019-12-24 DIAGNOSIS — R609 Edema, unspecified: Secondary | ICD-10-CM

## 2019-12-24 LAB — GLUCOSE, CAPILLARY
Glucose-Capillary: 141 mg/dL — ABNORMAL HIGH (ref 70–99)
Glucose-Capillary: 149 mg/dL — ABNORMAL HIGH (ref 70–99)
Glucose-Capillary: 161 mg/dL — ABNORMAL HIGH (ref 70–99)
Glucose-Capillary: 189 mg/dL — ABNORMAL HIGH (ref 70–99)
Glucose-Capillary: 195 mg/dL — ABNORMAL HIGH (ref 70–99)
Glucose-Capillary: 225 mg/dL — ABNORMAL HIGH (ref 70–99)

## 2019-12-24 LAB — POCT I-STAT 7, (LYTES, BLD GAS, ICA,H+H)
Acid-base deficit: 9 mmol/L — ABNORMAL HIGH (ref 0.0–2.0)
Bicarbonate: 14.8 mmol/L — ABNORMAL LOW (ref 20.0–28.0)
Calcium, Ion: 0.87 mmol/L — CL (ref 1.15–1.40)
HCT: 32 % — ABNORMAL LOW (ref 39.0–52.0)
Hemoglobin: 10.9 g/dL — ABNORMAL LOW (ref 13.0–17.0)
O2 Saturation: 98 %
Patient temperature: 97.3
Potassium: 3.1 mmol/L — ABNORMAL LOW (ref 3.5–5.1)
Sodium: 145 mmol/L (ref 135–145)
TCO2: 16 mmol/L — ABNORMAL LOW (ref 22–32)
pCO2 arterial: 24.1 mmHg — ABNORMAL LOW (ref 32.0–48.0)
pH, Arterial: 7.393 (ref 7.350–7.450)
pO2, Arterial: 108 mmHg (ref 83.0–108.0)

## 2019-12-24 LAB — COMPREHENSIVE METABOLIC PANEL
ALT: 89 U/L — ABNORMAL HIGH (ref 0–44)
AST: 210 U/L — ABNORMAL HIGH (ref 15–41)
Albumin: 2.7 g/dL — ABNORMAL LOW (ref 3.5–5.0)
Alkaline Phosphatase: 61 U/L (ref 38–126)
Anion gap: 18 — ABNORMAL HIGH (ref 5–15)
BUN: 31 mg/dL — ABNORMAL HIGH (ref 6–20)
CO2: 12 mmol/L — ABNORMAL LOW (ref 22–32)
Calcium: 6.8 mg/dL — ABNORMAL LOW (ref 8.9–10.3)
Chloride: 109 mmol/L (ref 98–111)
Creatinine, Ser: 2.89 mg/dL — ABNORMAL HIGH (ref 0.61–1.24)
GFR calc Af Amer: 33 mL/min — ABNORMAL LOW (ref >60)
GFR calc non Af Amer: 28 mL/min — ABNORMAL LOW (ref >60)
Glucose, Bld: 228 mg/dL — ABNORMAL HIGH (ref 70–99)
Potassium: 3.4 mmol/L — ABNORMAL LOW (ref 3.5–5.1)
Sodium: 139 mmol/L (ref 135–145)
Total Bilirubin: 5.3 mg/dL — ABNORMAL HIGH (ref 0.3–1.2)
Total Protein: 8.4 g/dL — ABNORMAL HIGH (ref 6.5–8.1)

## 2019-12-24 LAB — CSF CELL COUNT WITH DIFFERENTIAL
Eosinophils, CSF: 0 % (ref 0–1)
Eosinophils, CSF: 0 % (ref 0–1)
Lymphs, CSF: 100 % — ABNORMAL HIGH (ref 40–80)
Lymphs, CSF: 99 % — ABNORMAL HIGH (ref 40–80)
Monocyte-Macrophage-Spinal Fluid: 0 % — ABNORMAL LOW (ref 15–45)
Monocyte-Macrophage-Spinal Fluid: 1 % — ABNORMAL LOW (ref 15–45)
RBC Count, CSF: 1 /mm3 — ABNORMAL HIGH
RBC Count, CSF: 2 /mm3 — ABNORMAL HIGH
Segmented Neutrophils-CSF: 0 % (ref 0–6)
Segmented Neutrophils-CSF: 0 % (ref 0–6)
Tube #: 1
Tube #: 4
WBC, CSF: 100 /mm3 (ref 0–5)
WBC, CSF: 104 /mm3 (ref 0–5)

## 2019-12-24 LAB — LACTIC ACID, PLASMA
Lactic Acid, Venous: 7.1 mmol/L (ref 0.5–1.9)
Lactic Acid, Venous: 6.7 mmol/L (ref 0.5–1.9)

## 2019-12-24 LAB — ECHOCARDIOGRAM COMPLETE
Height: 73 in
Weight: 3573.22 oz

## 2019-12-24 LAB — CBC
HCT: 25 % — ABNORMAL LOW (ref 39.0–52.0)
Hemoglobin: 8.5 g/dL — ABNORMAL LOW (ref 13.0–17.0)
MCH: 33.5 pg (ref 26.0–34.0)
MCHC: 34 g/dL (ref 30.0–36.0)
MCV: 98.4 fL (ref 80.0–100.0)
Platelets: 96 10*3/uL — ABNORMAL LOW (ref 150–400)
RBC: 2.54 MIL/uL — ABNORMAL LOW (ref 4.22–5.81)
RDW: 16.4 % — ABNORMAL HIGH (ref 11.5–15.5)
WBC: 9.9 10*3/uL (ref 4.0–10.5)
nRBC: 0.5 % — ABNORMAL HIGH (ref 0.0–0.2)

## 2019-12-24 LAB — HSV DNA BY PCR (REFERENCE LAB)
HSV 1 DNA: NEGATIVE
HSV 2 DNA: NEGATIVE

## 2019-12-24 LAB — PHOSPHORUS
Phosphorus: 5.8 mg/dL — ABNORMAL HIGH (ref 2.5–4.6)
Phosphorus: 3.4 mg/dL (ref 2.5–4.6)

## 2019-12-24 LAB — TRIGLYCERIDES: Triglycerides: 192 mg/dL — ABNORMAL HIGH (ref <150)

## 2019-12-24 LAB — CK: Total CK: 87 U/L (ref 49–397)

## 2019-12-24 LAB — VALPROIC ACID LEVEL: Valproic Acid Lvl: 71 ug/mL (ref 50.0–100.0)

## 2019-12-24 LAB — PHENYTOIN LEVEL, TOTAL: Phenytoin Lvl: 10 ug/mL (ref 10.0–20.0)

## 2019-12-24 LAB — MAGNESIUM: Magnesium: 2.6 mg/dL — ABNORMAL HIGH (ref 1.7–2.4)

## 2019-12-24 MED ORDER — SODIUM CHLORIDE 0.9% FLUSH: 10.0000 mL | INTRAVENOUS | Status: DC | PRN

## 2019-12-24 MED ORDER — SODIUM CHLORIDE 0.9 % IV SOLN
1.0000 g | Freq: Once | INTRAVENOUS | Status: AC
  Administered 2019-12-24: 1 g via INTRAVENOUS
  Filled 2019-12-24 (×2): qty 10

## 2019-12-24 MED ORDER — PERAMPANEL 8 MG PO TABS
16.0000 mg | ORAL_TABLET | Freq: Once | ORAL | Status: AC
  Administered 2019-12-24: 16 mg
  Filled 2019-12-24: qty 2

## 2019-12-24 MED ORDER — PRO-STAT SUGAR FREE PO LIQD
30.0000 mL | Freq: Three times a day (TID) | ORAL | Status: DC
  Administered 2019-12-24 – 2019-12-25 (×3): 30 mL
  Filled 2019-12-24 (×2): qty 30

## 2019-12-24 MED ORDER — LACTATED RINGERS IV BOLUS
1000.0000 mL | Freq: Once | INTRAVENOUS | Status: AC
  Administered 2019-12-24: 14:00:00 1000 mL via INTRAVENOUS

## 2019-12-24 MED ORDER — VITAL 1.5 CAL PO LIQD
1000.0000 mL | ORAL | Status: DC
  Administered 2019-12-24: 1000 mL
  Filled 2019-12-24 (×2): qty 1000

## 2019-12-24 MED ORDER — SODIUM CHLORIDE 0.9 % IV BOLUS FOR AMBISOME
1000.0000 mL | INTRAVENOUS | Status: DC
  Administered 2019-12-25: 03:00:00 1000 mL via INTRAVENOUS

## 2019-12-24 MED ORDER — SODIUM CHLORIDE 0.9% FLUSH
10.0000 mL | Freq: Two times a day (BID) | INTRAVENOUS | Status: DC
  Administered 2019-12-24: 22:00:00 10 mL
  Administered 2019-12-24: 30 mL

## 2019-12-24 MED ORDER — POTASSIUM CHLORIDE 20 MEQ/15ML (10%) PO SOLN
40.0000 meq | Freq: Two times a day (BID) | ORAL | Status: DC
  Administered 2019-12-24 (×2): 40 meq
  Filled 2019-12-24 (×3): qty 30

## 2019-12-24 NOTE — Progress Notes (Signed)
EEG reviewed at the bedside. Low voltage/flat tracings with occasional low amplitude sharp waves, but no rhythmic discharges to suggest seizures.   Exam reveals no jerking or twitching of face or limbs.   Currently on propofol gtt at 5 and Versed gtt at 20. Pressor has been fully weaned off. BP stable.   Will continue to monitor.   10 minutes of ICU time.   Electronically signed: Dr. Kerney Elbe

## 2019-12-24 NOTE — Progress Notes (Addendum)
Reason for consult: Status epilepticus  Subjective: Patient was on high doses of Versed and propofol for suppression yesterday.  In the evening he had sudden worsening and hypotension requiring higher level of pressors.  Therefore his Versed and propofol were continued.  He had few subclinical seizures after that arising from right posterior quadrant.  Therefore, his Versed was increased to 20 mL/h.  No further seizures overnight.   ROS: Unable to obtain due to poor mental status  Examination  Vital signs in last 24 hours: Temp:  [92.8 F (33.8 C)-97.6 F (36.4 C)] 96.9 F (36.1 C) (12/30 0800) Pulse Rate:  [64-96] 92 (12/30 1100) Resp:  [14-26] 23 (12/30 1100) BP: (47-142)/(31-75) 123/46 (12/30 1100) SpO2:  [96 %-100 %] 97 % (12/30 1100) Arterial Line BP: (89-133)/(48-75) 105/50 (12/30 1100) FiO2 (%):  [40 %] 40 % (12/30 1000) Weight:  [101.3 kg] 101.3 kg (12/30 0500)  General: lying in bed, not in apparent distress CVS: pulse-normal rate and rhythm RS: breathing comfortably, intubated Extremities: Diffuse edema  Neuro: On Versed at 20 MS: Comatose, does not open eyes to noxious stimuli CN: pupils equal and reactive, corneal reflex absent, gag reflex absent, no forced gaze deviation, unable to assess other cranial nerves secondary to intubation  Motor: Flaccid, does not withdraw to noxious stimuli in all 4 extremities Reflexes: Areflexic, plantars mute   Basic Metabolic Panel: Recent Labs  Lab 12/21/19 1606 12/22/19 0729 12/22/19 2339 12/23/19 1002 12/23/19 1252 12/23/19 1600 12/23/19 1740 12/23/19 1945 12/24/19 0501 12/24/19 0959  NA 137 137 142  --  138  --  145 144 139 145  K 3.3* 4.3 3.5  --  2.8*  --  3.5 3.8 3.4* 3.1*  CL 111 109 114*  --  112*  --   --   --  109  --   CO2 17* 16* 15*  --  12*  --   --   --  12*  --   GLUCOSE 109* 135* 136*  --  129*  --   --   --  228*  --   BUN 12 14 20   --  25*  --   --   --  31*  --   CREATININE 1.46* 1.48* 1.99*  --   2.13*  --   --   --  2.89*  --   CALCIUM 8.0* 8.0* 7.7*  --  6.9*  --   --   --  6.8*  --   MG 1.8 2.4 2.8* 2.7*  --  2.9*  --   --  2.6*  --   PHOS 2.2*  --   --  5.0*  --  6.2*  --   --  5.8*  --     CBC: Recent Labs  Lab 12/02/2019 1516 12/21/19 1606 12/22/19 0729 12/22/19 2339 12/23/19 1740 12/23/19 1944 12/23/19 1945 12/24/19 0501 12/24/19 0959  WBC 4.7 7.8 12.8* 12.3*  --  13.2*  --  9.9  --   NEUTROABS 3.0  --   --   --   --   --   --   --   --   HGB 14.4 11.6* 12.1* 9.8* 9.9* 9.9* 10.9* 8.5* 10.9*  HCT 41.0 32.7* 34.6* 27.6* 29.0* 29.6* 32.0* 25.0* 32.0*  MCV 94.5 93.2 94.5 93.2  --  99.0  --  98.4  --   PLT 90* 97* 103* 109*  --  124*  --  96*  --  Coagulation Studies: Recent Labs    12/21/19 1606  LABPROT 19.4*  INR 1.7*    Imaging MRI brain without contrast 12/21/2019: Since the study of 05/25/2017, there has been marked increase in the size of the lateral and third ventricles. Compared to the 01/08/2019 study, there is increased ballooning of the atria of the lateral ventricles and slightly increased dilatation of the temporal horns. Otherwise, any progressive dilatation is subtle.    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   Status epilepticus HIV infection Recurrence of cryptococcal meningitis Hepatic impairment Renal impairment -LTM EEG overnight showed subclinical seizures arising from right posterior quadrant. -Lumbar puncture on 12/23/2019, CSF cell count 100 (lymphocytes 99), protein 68, glucose 107, cryptococcal antigen positive, rest pending -VPA level 71, phenytoin level pending, platelets stable, worsening liver function and renal function  Recommendations -Continue Keppra 1500 mg twice daily, Dilantin 100 mg 3 times daily, valproic acid 458 mg 3 times  daily -Loaded with perampanel 16 mg once with plan to start 5 mg nightly starting tomorrow -Given patient's history of hepatitis B, hepatitis C and hepatic impairment, discussed with pharmacy about using valproic acid and perampanel.    Will continue to use cautiously given moderate hepatic impairment. -We will plan to wean off of midazolam slowly at 2.5 mL/h today -If seizures recur, will add intermittent benzodiazepine.  -We will continue LTM EEG while we wean off Versed. -Patient is on high doses of AEDs.  Once status epilepticus is controlled and patient is successfully weaned off Versed, we will reduce doses slowly to adjust for hepatic and renal impairment.  Valproic acid will likely be the first AED to be reduced. -Management of cryptococcal meningitis per primary team/ID -Continue seizure precautions -As needed IV Versed 5 mg for clinical seizures -Patient is critically ill.  Palliative care has been consulted.  CRITICAL CARE Performed by: Lora Havens   Total critical care time: 4minutes  Critical care time was exclusive of separately billable procedures and treating other patients.  Critical care was necessary to treat or prevent imminent or life-threatening deterioration.  Critical care was time spent personally by me on the following activities: development of treatment plan with patient and/or surrogate as well as nursing, discussions with consultants, evaluation of patient's response to treatment, examination of patient, obtaining history from patient or surrogate, ordering and performing treatments and interventions, ordering and review of laboratory studies, ordering and review of radiographic studies, pulse oximetry and re-evaluation of patient's condition.

## 2019-12-24 NOTE — Progress Notes (Signed)
Subjective:  On ventilator sedated   Antibiotics:  Anti-infectives (From admission, onward)   Start     Dose/Rate Route Frequency Ordered Stop   12/24/19 0800  vancomycin (VANCOREADY) IVPB 1250 mg/250 mL  Status:  Discontinued     1,250 mg 166.7 mL/hr over 90 Minutes Intravenous Every 24 hours 12/23/19 1332 12/23/19 1540   12/24/19 0300  vancomycin (VANCOREADY) IVPB 1250 mg/250 mL  Status:  Discontinued     1,250 mg 166.7 mL/hr over 90 Minutes Intravenous Every 24 hours 12/23/19 1251 12/23/19 1332   12/24/19 0200  flucytosine (ANCOBON) capsule 2,250 mg  Status:  Discontinued     25 mg/kg  91.6 kg Per Tube Every 6 hours 12/23/19 2334 12/24/19 1126   12/24/19 0100  amphotericin B liposome (AMBISOME) 270 mg in dextrose 5 % 500 mL IVPB     3 mg/kg  91.6 kg 283.8 mL/hr over 120 Minutes Intravenous Every 24 hours 12/23/19 1852     12/23/19 2000  flucytosine (ANCOBON) capsule 2,250 mg  Status:  Discontinued     25 mg/kg  91.6 kg Per Tube Every 6 hours 12/23/19 1852 12/23/19 2333   12/23/19 1000  sulfamethoxazole-trimethoprim (BACTRIM) 200-40 MG/5ML suspension 10 mL     10 mL Per Tube Daily 12/22/19 2226     12/23/19 1000  rifaximin (XIFAXAN) tablet 550 mg     550 mg Per Tube 2 times daily 12/23/19 0926     12/23/19 0200  flucytosine (ANCOBON) capsule 2,250 mg  Status:  Discontinued     25 mg/kg  91.6 kg Per Tube Every 6 hours 12/23/19 0155 12/23/19 0159   12/23/19 0200  flucytosine (ANCOBON) capsule 2,250 mg  Status:  Discontinued     25 mg/kg  91.6 kg Per Tube Every 6 hours 12/23/19 0159 12/23/19 1829   12/22/19 1300  sulfamethoxazole-trimethoprim (BACTRIM) 400-80 MG per tablet 1 tablet  Status:  Discontinued     1 tablet Oral Daily 12/22/19 1151 12/22/19 2226   12/22/19 0800  Darunavir-Cobicisctat-Emtricitabine-Tenofovir Alafenamide (SYMTUZA) 800-150-200-10 MG TABS 1 tablet  Status:  Discontinued     1 tablet Oral Daily with breakfast 12/21/19 1222 12/22/19 1304   12/21/19 1300  dolutegravir (TIVICAY) tablet 50 mg  Status:  Discontinued     50 mg Oral Daily 12/21/19 1248 12/22/19 1304   12/21/19 1200  vancomycin (VANCOREADY) IVPB 1250 mg/250 mL  Status:  Discontinued     1,250 mg 166.7 mL/hr over 90 Minutes Intravenous Every 12 hours 12/05/2019 2312 12/23/19 1251   12/21/19 0100  amphotericin B liposome (AMBISOME) 270 mg in dextrose 5 % 500 mL IVPB  Status:  Discontinued     3 mg/kg  91.6 kg 283.8 mL/hr over 120 Minutes Intravenous Every 24 hours 11/30/2019 2312 12/23/19 1829   12/21/19 0100  rifaximin (XIFAXAN) tablet 550 mg  Status:  Discontinued     550 mg Oral 2 times daily 12/21/19 0057 12/23/19 0926   12/21/19 0045  acyclovir (ZOVIRAX) 915 mg in dextrose 5 % 150 mL IVPB  Status:  Discontinued     10 mg/kg  91.6 kg 168.3 mL/hr over 60 Minutes Intravenous Every 8 hours 12/21/19 0035 12/23/19 1539   12/21/19 0000  ampicillin (OMNIPEN) 2 g in sodium chloride 0.9 % 100 mL IVPB  Status:  Discontinued     2 g 300 mL/hr over 20 Minutes Intravenous Every 4 hours 12/07/2019 2312 12/23/19 1539   12/21/19 0000  flucytosine (ANCOBON) capsule 2,250  mg  Status:  Discontinued     25 mg/kg  91.6 kg Oral Every 6 hours 12/10/2019 2312 11/27/2019 2327   12/21/19 0000  vancomycin (VANCOREADY) IVPB 2000 mg/400 mL     2,000 mg 200 mL/hr over 120 Minutes Intravenous  Once 12/06/2019 2312 12/21/19 0521   12/21/19 0000  flucytosine (ANCOBON) capsule 2,250 mg  Status:  Discontinued     25 mg/kg  91.6 kg Oral Every 6 hours 12/19/2019 2327 12/23/19 0155   12/09/2019 2215  cefTRIAXone (ROCEPHIN) 2 g in sodium chloride 0.9 % 100 mL IVPB  Status:  Discontinued     2 g 200 mL/hr over 30 Minutes Intravenous Every 12 hours 12/23/2019 2214 12/23/19 1539      Medications: Scheduled Meds: . chlorhexidine gluconate (MEDLINE KIT)  15 mL Mouth Rinse BID  . Chlorhexidine Gluconate Cloth  6 each Topical Q0600  . enoxaparin (LOVENOX) injection  40 mg Subcutaneous Q24H  . famotidine  20 mg Per  Tube Daily  . feeding supplement (PRO-STAT SUGAR FREE 64)  60 mL Per Tube TID  . feeding supplement (VITAL HIGH PROTEIN)  1,000 mL Per Tube Q24H  . folic acid  1 mg Per Tube Daily  . insulin aspart  0-15 Units Subcutaneous Q4H  . lactulose  30 g Per Tube TID  . LORazepam  2 mg Intravenous Once  . LORazepam  2 mg Intravenous Once  . magnesium oxide  200 mg Per Tube Daily  . mouth rinse  15 mL Mouth Rinse 10 times per day  . multivitamin  15 mL Per Tube Daily  . nicotine  21 mg Transdermal Daily  . phenytoin (DILANTIN) IV  100 mg Intravenous Q8H  . potassium chloride  40 mEq Per Tube BID  . rifaximin  550 mg Per Tube BID  . sodium chloride  1,000 mL Intravenous Q24H  . [START ON Jan 15, 2020] sodium chloride  1,000 mL Intravenous Q24H  . sodium chloride flush  10-40 mL Intracatheter Q12H  . spironolactone  50 mg Per NG tube BID  . sulfamethoxazole-trimethoprim  10 mL Per Tube Daily  . thiamine  100 mg Oral Daily   Or  . thiamine  100 mg Intravenous Daily   Continuous Infusions: . sodium chloride    . amphotericin  B  Liposome (AMBISOME) ADULT IV Stopped (12/24/19 0506)  . calcium chloride 1 g (12/24/19 1217)  . dextrose 0 mL (12/21/19 1220)  . dextrose 0 mL/hr at 12/22/19 1946  . fentaNYL infusion INTRAVENOUS 50 mcg/hr (12/24/19 1100)  . levETIRAcetam Stopped (12/24/19 0521)  . midazolam 18.5 mg/hr (12/24/19 1100)  . norepinephrine (LEVOPHED) Adult infusion 30 mcg/min (12/24/19 1100)  . phenylephrine (NEO-SYNEPHRINE) Adult infusion Stopped (12/23/19 2300)  . propofol (DIPRIVAN) infusion Stopped (12/24/19 0804)  .  sodium bicarbonate (isotonic) infusion in sterile water 150 mL/hr at 12/24/19 1154  . valproate sodium Stopped (12/24/19 0652)   PRN Meds:.Place/Maintain arterial line **AND** sodium chloride, acetaminophen **OR** acetaminophen, acetaminophen, diphenhydrAMINE **OR** diphenhydrAMINE, LORazepam, meperidine (DEMEROL) injection, sodium chloride flush    Objective: Weight  change: 9.2 kg  Intake/Output Summary (Last 24 hours) at 12/24/2019 1256 Last data filed at 12/24/2019 1100 Gross per 24 hour  Intake 8792.49 ml  Output 1345 ml  Net 7447.49 ml   Blood pressure (!) 123/46, pulse 92, temperature (!) 96.9 F (36.1 C), temperature source Axillary, resp. rate (!) 23, height '6\' 1"'$  (1.854 m), weight 101.3 kg, SpO2 97 %. Temp:  [92.8 F (33.8 C)-97.6 F (36.4 C)]  96.9 F (36.1 C) (12/30 0800) Pulse Rate:  [64-96] 92 (12/30 1100) Resp:  [16-26] 23 (12/30 1100) BP: (47-142)/(31-75) 123/46 (12/30 1120) SpO2:  [96 %-99 %] 97 % (12/30 1120) Arterial Line BP: (89-133)/(48-75) 105/50 (12/30 1100) FiO2 (%):  [40 %] 40 % (12/30 1120) Weight:  [101.3 kg] 101.3 kg (12/30 0500)  Physical Exam: General: On ventilator and sedated. HEENT: anicteric sclera, EOMI CVS normal rate Chest: on ventilator Abdomen: soft non-distended,  Extremities: no edema or deformity noted bilaterally Skin: diffuse rash on arms, legs Neuro: nonfocal  CBC:    BMET Recent Labs    12/23/19 1252 12/24/19 0501 12/24/19 0959  NA 138 139 145  K 2.8* 3.4* 3.1*  CL 112* 109  --   CO2 12* 12*  --   GLUCOSE 129* 228*  --   BUN 25* 31*  --   CREATININE 2.13* 2.89*  --   CALCIUM 6.9* 6.8*  --      Liver Panel  Recent Labs    12/22/19 2339 12/24/19 0501  PROT 8.1 8.4*  ALBUMIN 1.6* 2.7*  AST 98* 210*  ALT 50* 89*  ALKPHOS 76 61  BILITOT 6.0* 5.3*       Sedimentation Rate No results for input(s): ESRSEDRATE in the last 72 hours. C-Reactive Protein No results for input(s): CRP in the last 72 hours.  Micro Results: Recent Results (from the past 720 hour(s))  Respiratory Panel by RT PCR (Flu A&B, Covid) - Nasopharyngeal Swab     Status: None   Collection Time: 12/05/2019  2:40 AM   Specimen: Nasopharyngeal Swab  Result Value Ref Range Status   SARS Coronavirus 2 by RT PCR NEGATIVE NEGATIVE Final    Comment: (NOTE) SARS-CoV-2 target nucleic acids are NOT  DETECTED. The SARS-CoV-2 RNA is generally detectable in upper respiratoy specimens during the acute phase of infection. The lowest concentration of SARS-CoV-2 viral copies this assay can detect is 131 copies/mL. A negative result does not preclude SARS-Cov-2 infection and should not be used as the sole basis for treatment or other patient management decisions. A negative result may occur with  improper specimen collection/handling, submission of specimen other than nasopharyngeal swab, presence of viral mutation(s) within the areas targeted by this assay, and inadequate number of viral copies (<131 copies/mL). A negative result must be combined with clinical observations, patient history, and epidemiological information. The expected result is Negative. Fact Sheet for Patients:  PinkCheek.be Fact Sheet for Healthcare Providers:  GravelBags.it This test is not yet ap proved or cleared by the [place] FDA and  has been authorized for detection and/or diagnosis of SARS-CoV-2 by FDA under an Emergency Use Authorization (EUA). This EUA will remain  in effect (meaning this test can be used) for the duration of the COVID-19 declaration under Section 564(b)(1) of the Act, 21 U.S.C. section 360bbb-3(b)(1), unless the authorization is terminated or revoked sooner.    Influenza A by PCR NEGATIVE NEGATIVE Final   Influenza B by PCR NEGATIVE NEGATIVE Final    Comment: (NOTE) The Xpert Xpress SARS-CoV-2/FLU/RSV assay is intended as an aid in  the diagnosis of influenza from Nasopharyngeal swab specimens and  should not be used as a sole basis for treatment. Nasal washings and  aspirates are unacceptable for Xpert Xpress SARS-CoV-2/FLU/RSV  testing. Fact Sheet for Patients: PinkCheek.be Fact Sheet for Healthcare Providers: GravelBags.it This test is not yet approved or cleared  by the [place] FDA and  has been authorized for detection and/or diagnosis  of SARS-CoV-2 by  FDA under an Emergency Use Authorization (EUA). This EUA will remain  in effect (meaning this test can be used) for the duration of the  Covid-19 declaration under Section 564(b)(1) of the Act, 21  U.S.C. section 360bbb-3(b)(1), unless the authorization is  terminated or revoked. Performed at [facility], [place] [address], [place], [place] [address]   Blood culture (routine x 2)     Status: None (Preliminary result)   Collection Time: 12/11/2019  7:47 PM   Specimen: BLOOD  Result Value Ref Range Status   Specimen Description BLOOD LEFT ANTECUBITAL  Final   Special Requests   Final    BOTTLES DRAWN AEROBIC AND ANAEROBIC Blood Culture results may not be optimal due to an excessive volume of blood received in culture bottles   Culture   Final    NO GROWTH 4 DAYS Performed at [facility], [place] [address], [place], [place] [address]    Report Status PENDING  Incomplete  Blood culture (routine x 2)     Status: None (Preliminary result)   Collection Time: 11/29/2019  7:59 PM   Specimen: BLOOD RIGHT HAND  Result Value Ref Range Status   Specimen Description BLOOD RIGHT HAND  Final   Special Requests   Final    BOTTLES DRAWN AEROBIC AND ANAEROBIC Blood Culture adequate volume   Culture   Final    NO GROWTH 4 DAYS Performed at [facility], [place] [address], [place], [place] [address]    Report Status PENDING  Incomplete  MRSA PCR Screening     Status: None   Collection Time: 12/22/19 10:25 PM   Specimen: Nasopharyngeal  Result Value Ref Range Status   MRSA by PCR NEGATIVE NEGATIVE Final    Comment:        The GeneXpert MRSA Assay (FDA approved for NASAL specimens only), is one component of a comprehensive MRSA colonization surveillance program. It is not intended to diagnose MRSA infection nor to guide or monitor treatment for MRSA infections. Performed at [facility], [place] [address] [place], [place] [address]   CSF culture     Status: None (Preliminary result)   Collection Time: 12/23/19  9:08 AM   Specimen: CSF; Cerebrospinal Fluid  Result Value Ref Range Status   Specimen Description CSF  Final   Special Requests Immunocompromised  Final   Gram Stain   Final    WBC PRESENT, PREDOMINANTLY MONONUCLEAR NO ORGANISMS SEEN CYTOSPIN SMEAR    Culture   Final    NO GROWTH < 24 HOURS Performed at [facility], [place] [address], [place], [place] [address]    Report Status PENDING  Incomplete    Studies/Results: DG Chest 1 View  Result Date: 12/24/2019 CLINICAL DATA:  Shock, evaluate support structures EXAM: CHEST  1 VIEW COMPARISON:  Chest radiograph from one day prior. FINDINGS: Endotracheal tube tip is 3.4 cm above the carina. Enteric tube enters stomach with the tip not seen on this image. Left internal jugular central venous catheter terminates over the right atrium. Stable cardiomediastinal silhouette with top-normal heart size. No pneumothorax. No pleural effusion. Low lung volumes. Patchy left lung base consolidation appears similar. IMPRESSION: 1. Well-positioned support structures.  No pneumothorax. 2. Low lung volumes with similar patchy left lung base consolidation suspicious for pneumonia, aspiration or atelectasis. Electronically Signed   By: Ilona Sorrel M.D.   On: 12/24/2019 09:40   DG Chest 1 View  Result Date: 12/23/2019 CLINICAL DATA:  Shock. Aids. Acute respiratory failure. Endotracheally intubated. Chronic hepatitis-B and C. EXAM: CHEST  1 VIEW COMPARISON:  Prior today FINDINGS: The endotracheal tube, nasogastric tube, and left jugular central venous catheter remain in appropriate position. Mild worsening airspace disease is seen in the mid and lower lung zones bilaterally. No evidence of pneumothorax or pleural effusion. IMPRESSION: Mild worsening of bilateral airspace disease since prior exam. Electronically Signed   By:  Marlaine Hind M.D.   On: 12/23/2019 19:34   DG Abd 1 View  Result Date: 12/24/2019 CLINICAL DATA:  Shock, enteric tube placed EXAM: ABDOMEN - 1 VIEW COMPARISON:  12/22/2019 abdominal radiograph FINDINGS: Enteric tube terminates in the body of the stomach. No disproportionately dilated small bowel loops. Mild-to-moderate stool and gas in the large bowel. No evidence of pneumatosis or pneumoperitoneum. Hazy left lung base opacity appears similar. No radiopaque nephrolithiasis. IMPRESSION: 1. Well-positioned enteric tube with tip in the body of the stomach. 2. No disproportionately dilated small bowel loops. Mild-to-moderate colonic gas and stool. No evidence of pneumatosis or pneumoperitoneum. Electronically Signed   By: Ilona Sorrel M.D.   On: 12/24/2019 09:39   DG Abd 1 View  Result Date: 12/22/2019 CLINICAL DATA:  OG tube placement EXAM: ABDOMEN - 1 VIEW COMPARISON:  None. FINDINGS: The enteric tube terminates over the gastric body. The visualized bowel gas pattern is nonobstructive. IMPRESSION: OG tube terminates over the gastric body. Electronically Signed   By: Constance Holster M.D.   On: 12/22/2019 22:36   DG CHEST PORT 1 VIEW  Result Date: 12/23/2019 CLINICAL DATA:  Hypoxia.  Central catheter placement. EXAM: PORTABLE CHEST 1 VIEW COMPARISON:  December 22, 2019 FINDINGS: Endotracheal tube tip is at the carina. Central catheter tip is in the right atrium with the tip approximately 5 cm distal to the cavoatrial junction. Nasogastric tube tip and side port are below the diaphragm. No pneumothorax. No edema or consolidation. Heart is borderline enlarged with pulmonary vascularity normal. No adenopathy. No bone lesions. IMPRESSION: 1. Tube and catheter positions as described without pneumothorax. Note that the endotracheal tube tip is at the carina. Advise withdrawing endotracheal tube approximately 3 cm. 2.  Lungs clear. 3.  Borderline cardiac enlargement.  No adenopathy. These results will be  called to the ordering clinician or representative by the Radiologist Assistant, and communication documented in the PACS or zVision Dashboard. Electronically Signed   By: Lowella Grip III M.D.   On: 12/23/2019 09:21   DG CHEST PORT 1 VIEW  Result Date: 12/22/2019 CLINICAL DATA:  Endotracheal tube placement EXAM: PORTABLE CHEST 1 VIEW COMPARISON:  July 04, 2019 FINDINGS: The endotracheal tube terminates at the carina. Repositioning is recommended. The lung volumes are low. There is an enteric tube extends below the left hemidiaphragm. There is no pneumothorax. Bibasilar airspace opacities are noted, left worse than right. There is no acute osseous abnormality. No significant pleural effusion. IMPRESSION: 1. Endotracheal tube terminates at the carina. Repositioning is recommended. 2. Bibasilar airspace opacities, left worse than right. Findings may reflect atelectasis, aspiration, or pneumonia. 3. No pneumothorax.  Low lung volumes. These results will be called to the ordering clinician or representative by the Radiologist Assistant, and communication documented in the PACS or zVision Dashboard. Electronically Signed   By: Constance Holster M.D.   On: 12/22/2019 22:36   EEG adult  Result Date: 12/22/2019 Lora Havens, MD     12/22/2019  7:56 PM Patient Name: TERON BLAIS MRN: [id number]  Epilepsy Attending: Lora Havens Referring Physician/Provider: Dr Modena Nunnery Date: 12/22/2019 Duration: 21.24 mins Patient history: 28 year old, HIV+ presented for persistent headache and had prolonged seizure today. EEG to evaluate for status epilepticus Level of alertness: comatose AEDs during EEG study: LEV, PHT, ativan Technical aspects: This EEG study was done with scalp electrodes positioned according to the 10-20 International system of electrode placement. Electrical activity was acquired at a sampling rate of '500Hz'$  and reviewed with a high frequency filter of '70Hz'$  and a low frequency filter of  '1Hz'$ . EEG data were recorded continuously and digitally stored. DESCRIPTION: At the beginning of study, EEG showed continuous generalized 3-'5hz'$  theta-delta slowing admixed with 13-'15hz'$  generalized beta activity. One seizure was recorded at 1935 during which patient was noted to have left arm and leg jerking. Concomitant EEG showed rhythmic 2-'3hz'$  delta slowing in right posterior quadrant which evolved into spikes at '1hz'$ . Seizure was waxing and waning but ongoing at the end of study at 1937. Hyperventilation and photic stimulation were not performed. ABNORMALITY - Continuous slow, generalized - Seizure, right posterior quadrant IMPRESSION: This study showed one seizure arising from right posterior quadrant during which patient was noted to have left arm and leg jerking, lasting over 2 minutes ( study ended while seizure was ongoing). There is also evidence of severe diffuse encephalopathy, non specific to etiology. Of note, per history patient has been having frequent left sided jerking for prolonged period and is not back to baseline and therefore meets criteria for status epilepticus. Dr Cheral Marker was notified. Priyanka Barbra Sarks   Overnight EEG with video  Result Date: 12/23/2019 Lora Havens, MD     12/24/2019 11:34 AM Patient Name: ANTONIE BORJON MRN: [id number] Epilepsy Attending: Lora Havens Referring Physician/Provider: Dr Amie [place] Duration: 12/22/2019 1938 to 12/23/2019 1938  Patient history: 28 year old, HIV+ presented for persistent headache and had prolonged seizure today. EEG to evaluate for status epilepticus  Level of alertness: comatose  AEDs during EEG study: LEV, PHT, VPA, versed, propofol  Technical aspects: This EEG study was done with scalp electrodes positioned according to the 10-20 International system of electrode placement. Electrical activity was acquired at a sampling rate of '500Hz'$  and reviewed with a high frequency filter of '70Hz'$  and a low frequency filter of '1Hz'$ . EEG data  were recorded continuously and digitally stored.  DESCRIPTION: EEG initially showed frequent seizures which started with left sided jerking followed by whole body jerking, last seizure on 12/22/2019 at 2134. Concomitant EEG showed rhythmic 2-'3hz'$  delta slowing in right posterior quadrant which evolved into spikes at '1hz'$  and involved both hemispheres. EEG also showed continuous generalized 3-'5hz'$  theta-delta slowing admixed with 13-'15hz'$  generalized beta activity.   Patient was then started on Versed and propofol after which EEG showed generalized background suppression. At 1748 on 12/23/2019, Versed and propofol had to be turned off due to sudden hypotension.  After that EEG showed rhythmic 2 to 3 Hz delta activity with overriding fast activity in right posterior quadrant.  Multiple subclinical seizures were also seen, right posterior quadrant, last seizure at 1908 on 12/23/2019.  EEG also showed continuous generalized 2 to '3hz'$  delta slowing. ABNORMALITY - Continuous slow, generalized - Focal convulsive status epilepticus, right posterior quadrant -Background suppression, generalized  IMPRESSION: This study  initially showed focal convulsive status epilepticus arising from right posterior quadrant during which patient was noted to have left arm and leg jerking, last seizure on 12/22/2019 at 2134.  EEG also showed evidence of severe diffuse encephalopathy, non  specific to etiology but likely secondary to sedation. At 1748 on 12/23/2019, Versed and propofol had to be turned off due to sudden hypotension after which the subclinical seizures were seen arising from right posterior quadrant, last seizure at 1908 on 12/23/2019. Dr. Kerney Elbe was notified when patient had subclinical seizures.Lora Havens   ECHOCARDIOGRAM COMPLETE  Result Date: 12/24/2019   ECHOCARDIOGRAM REPORT   Patient Name:   SHARIFF LASKY Date of Exam: 12/24/2019 Medical Rec #:  [id number]      Height: Accession #:    [id number]      Weight: Date of Birth:  03-09-1991       BSA: Patient Age:    28 years       BP:           125/50 mmHg Patient Gender: M              HR:           92 bpm. Exam Location:  Inpatient Procedure: 2D Echo Indications:    Pulmonary Embolus  History:        Patient has prior history of Echocardiogram examinations, most                 recent 06/06/2019. CHF; Risk Factors:Current Smoker.                 Nonintractable headache,Seizure ([place]), S/P VP shunt,HIV                 infection , Alcoholic hepatitis, Chronic hepatitis C with                 hepatic coma.  Sonographer:    Leavy Cella Referring Phys: [id number] [place]  1. Left ventricular ejection fraction, by visual estimation, is >75%. The left ventricle has hyperdynamic function. There is no left ventricular hypertrophy.  2. The left ventricle has no regional wall motion abnormalities.  3. Intracavitary gradient of 17 mmHG present due to hyperdynamic LV.  4. Global right ventricle has normal systolic function.The right ventricular size is normal. No increase in right ventricular wall thickness.  5. Left atrial size was normal.  6. Right atrial size was normal.  7. The pericardial effusion is circumferential.  8. Trivial pericardial effusion is present.  9. The mitral valve is grossly normal. Trivial mitral valve regurgitation. 10. The tricuspid valve is grossly normal. 11. The aortic valve is tricuspid. Aortic valve regurgitation is not visualized. No evidence of aortic valve sclerosis or stenosis. 12. The pulmonic valve was grossly normal. Pulmonic valve regurgitation is trivial. 13. The inferior vena cava is normal in size with greater than 50% respiratory variability, suggesting right atrial pressure of 3 mmHg. 14. No significant change from prior study. FINDINGS  Left Ventricle: Left ventricular ejection fraction, by visual estimation, is >75%. The left ventricle has hyperdynamic function. The left ventricle has no regional wall motion  abnormalities. The left ventricular internal cavity size was the left ventricle is normal in size. There is no left ventricular hypertrophy. Left ventricular diastolic parameters were normal. Normal left atrial pressure. Intracavitary gradient of 17 mmHG present due to hyperdynamic LV. Right Ventricle: The right ventricular size is normal. No increase in right ventricular wall thickness. Global RV systolic function is has normal systolic function. Left Atrium: Left atrial size was normal in size. Right Atrium: Right atrial size was normal in size Pericardium: Trivial pericardial effusion is present. The pericardial effusion is circumferential. Mitral Valve: The  mitral valve is grossly normal. Trivial mitral valve regurgitation. Tricuspid Valve: The tricuspid valve is grossly normal. Tricuspid valve regurgitation is trivial. Aortic Valve: The aortic valve is tricuspid. Aortic valve regurgitation is not visualized. The aortic valve is structurally normal, with no evidence of sclerosis or stenosis. Aortic valve mean gradient measures 12.5 mmHg. Aortic valve peak gradient measures 24.0 mmHg. Pulmonic Valve: The pulmonic valve was grossly normal. Pulmonic valve regurgitation is trivial. Pulmonic regurgitation is trivial. Aorta: The aortic root is normal in size and structure. Venous: The inferior vena cava is normal in size with greater than 50% respiratory variability, suggesting right atrial pressure of 3 mmHg. IAS/Shunts: No atrial level shunt detected by color flow Doppler.  AORTIC VALVE AV Vmax:      244.93 cm/s AV Vmean:     169.120 cm/s AV VTI:       0.351 m AV Peak Grad: 24.0 mmHg AV Mean Grad: 12.5 mmHg  Eleonore Chiquito MD Electronically signed by Eleonore Chiquito MD Signature Date/Time: 12/24/2019/10:29:17 AM    Final    VAS [place] LOWER EXTREMITY VENOUS (DVT)  Result Date: 12/24/2019  Lower Venous Study Indications: Edema. Other Indications: Sudden shock, off AC. 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 History of seizure disorder. Comparison Study: Negative LEV study on 06/03/19. Performing Technologist: Oda Cogan RDMS, RVT  Examination Guidelines: A complete evaluation includes B-mode imaging, spectral Doppler, color Doppler, and power Doppler as needed of all accessible portions of each vessel. Bilateral testing is considered an integral part of a complete examination. Limited examinations for reoccurring indications may be performed as noted.  +---------+---------------+---------+-----------+----------+--------------+ RIGHT    CompressibilityPhasicitySpontaneityPropertiesThrombus Aging +---------+---------------+---------+-----------+----------+--------------+ CFV      Full           Yes      Yes                                 +---------+---------------+---------+-----------+----------+--------------+ SFJ      Full                                                        +---------+---------------+---------+-----------+----------+--------------+ FV Prox  Full                                                        +---------+---------------+---------+-----------+----------+--------------+ FV Mid   Full                                                        +---------+---------------+---------+-----------+----------+--------------+ FV DistalFull                                                        +---------+---------------+---------+-----------+----------+--------------+  PFV      Full                                                        +---------+---------------+---------+-----------+----------+--------------+ POP      Full           Yes      Yes                                 +---------+---------------+---------+-----------+----------+--------------+ PTV      Full                                                        +---------+---------------+---------+-----------+----------+--------------+ PERO     Full                                                         +---------+---------------+---------+-----------+----------+--------------+   +---------+---------------+---------+-----------+----------+--------------+ LEFT     CompressibilityPhasicitySpontaneityPropertiesThrombus Aging +---------+---------------+---------+-----------+----------+--------------+ CFV      Full           Yes      Yes                                 +---------+---------------+---------+-----------+----------+--------------+ SFJ      Full                                                        +---------+---------------+---------+-----------+----------+--------------+ FV Prox  Full                                                        +---------+---------------+---------+-----------+----------+--------------+ FV Mid   Full                                                        +---------+---------------+---------+-----------+----------+--------------+ FV DistalFull                                                        +---------+---------------+---------+-----------+----------+--------------+ PFV      Full                                                        +---------+---------------+---------+-----------+----------+--------------+  POP      Full           Yes      Yes                                 +---------+---------------+---------+-----------+----------+--------------+ PTV      Full                                                        +---------+---------------+---------+-----------+----------+--------------+ PERO     Full                                                        +---------+---------------+---------+-----------+----------+--------------+     Summary: Right: There is no evidence of deep vein thrombosis in the lower extremity. No cystic structure found in the popliteal fossa. Left: There is no evidence of deep vein thrombosis in the lower extremity.  No cystic structure found in the popliteal fossa.  *See table(s) above for measurements and observations.    Preliminary       Assessment/Plan:  INTERVAL HISTORY:   Patient remains on ventilator sedated, Cr is worse  Active Problems:   Nonintractable headache   Seizure ([place])   AMS (altered mental status)   Status epilepticus ([place])    Jayland R Rominger is a 28 y.o. 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  I tried repeatedly to try to have [place] institutionalized so that he could be on consistent therapy but he and grandmother had decided against this.  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  HBV may recur off antivirals  #4 goals of care: agree with palliative  care consult and would support shift to comfort care.     LOS: 3 days   Alcide Evener 12/24/2019, 12:56 PM

## 2019-12-24 NOTE — Progress Notes (Signed)
LTM maintenance completed; reprepped C4, checked Fp1, Fp2, and retaped. No skin breakdown was seen.

## 2019-12-24 NOTE — Progress Notes (Signed)
Lower ext venous study  has been completed. Refer to [facility] under chart review to view preliminary results.   12/24/2019  10:20 AM Nayvie Lips, Bonnye Fava

## 2019-12-24 NOTE — Progress Notes (Signed)
Palliative:  Meeting with mother and grandmother in room 12-28-19 1100 am for [place] conversation.   No charge  Vinie Sill, NP Palliative Medicine Team Pager [phone] (Please see [email or web address] for schedule) Team Phone [phone]

## 2019-12-24 NOTE — Progress Notes (Signed)
NAME:  Justin Ball, MRN:  [id number], DOB:  27-Dec-1990, LOS: 3 ADMISSION DATE:  12-29-19, CONSULTATION DATE:  12/23/19 REFERRING MD:  IM Teaching Service, CHIEF COMPLAINT:  Headache   Brief History   This is a 28 year old man with a history of AIDS (CD4 90s), hep B/C/EtOH cirrhosis, bipolar disorder, dementia, neurogenic bowel/bladder disease, meningococcal meningeal encephalitis status post prior shunting now removed after infection who presented with progressive gait instability and headaches found to have worsening communicating hydrocephalus.  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  Patient also noted to have seizure-like activity.  Hospital course complicated on 12/22/2019 by refractory status epilepticus requiring intubation and transfer to the intensive care unit.  History is per chart review as patient is obtunded on ventilator.  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 Since the study of 05/25/2017, there has been marked increase in the size of the lateral and third ventricles. Compared to the 01/08/2019 study, there is increased ballooning of the atria of the lateral ventricles and slightly increased dilatation of the temporal horns. Otherwise, any progressive dilatation is subtle.  12/22/19 CD4 89  Micro Data:  MRSA PCR positive Blood cx NGTD CSF>>> cryptococcal Ag +  Antimicrobials:  12/27 onward Acyclovir Amphotericin Flucytosine Rifaxamin Bactrim ppx  Interim history/subjective:  Decompensated last night with refractory acidosis.  Improved with turning off high doses of sedation and bicarb supplementation.  Finally making some urine.  Titration of AEDs and benzo drip ongoing.  Objective   Blood pressure (!) 125/50, pulse 92, temperature (!) 97.4 F (36.3 C), temperature source Axillary, resp. rate (!) 23, height  (1.854 m), weight 101.3 kg, SpO2 97 %.    Vent Mode: PRVC FiO2 (%):  [40 %] 40 % Set Rate:  [16 bmp-22 bmp] 22 bmp Vt Set:  [640 mL] 640 mL PEEP:  [5 cmH20] 5 cmH20 Plateau Pressure:  [15 cmH20-25 cmH20] 25 cmH20   Intake/Output Summary (Last 24 hours) at 12/24/2019 0911 Last data filed at 12/24/2019 0800 Gross per 24 hour  Intake 9512.6 ml  Output 1300 ml  Net 8212.6 ml   Filed Weights   12/29/19 2300 12/23/19 0500 12/24/19 0500  Weight: 91.6 kg 92.1 kg 101.3 kg    Examination: General: Obtunded young man on ventilator HENT: Endotracheal tube in place minimal secretions Lungs: Lung sounds are clear, no accessory muscle use Cardiovascular: Heart sounds regular, extremities warm Abdomen: Soft, +BS Extremities: No edema Neuro: GCS3 GU: foley in place  TG up but not  alarmingly so Cr slightly worse Bicarb  down Finally making some urine, 1L yesterday All cell lines down on CBC, likely dilutional  Resolved Hospital Problem list   N/A  Assessment & Plan:  # Status epilepticus in setting of recurrent cryptococcal meningitis with hydrocephalus # Shock, renal dysfunction, severe lactic acidosis- I question propofol infusion syndrome here more than anything # Acute hypoxemic respiratory failure secondary to inability to protect airway # AIDS/HIV noncompliant with ART # Combination of baseline HIV dementia, hepatic encephalopathy, and vascular dementia # Cirrhosis from hepatitis C, hepatitis B, alcohol abuse  - AEDs per neurology, titrate benzo drip alone, avoid further propofol - Fentanyl drip - Check echo, LE dopplers, titrate pressors to maintain MAP > 65 - Continue stress steroids for now - Add vasopressin - Check CXR/AXR - Continue bicarb drip, check ABG, lactate, CPK - Antimicrobials per ID, appreciate help - Will need some way of offloading CSF whether it be another shunt or intermittent LPs - Overall, prognosis is dismal, palliative has been consulted I will try to reach out to mother also  Best practice:  Diet: Start TF Pain/Anxiety/Delirium protocol (if indicated): See above VAP protocol (if indicated): In place DVT prophylaxis: Lovenox to resume today GI prophylaxis: pepcid Glucose control: SSI Mobility: BR Code Status: Full Family Communication: will reach out Disposition: ICU pending vent liberation  Labs   CBC: Recent Labs  Lab 11/29/2019 1516 12/21/19 1606 12/22/19 0729 12/22/19 2339 12/23/19 1740 12/23/19 1944 12/23/19 1945 12/24/19 0501  WBC 4.7 7.8 12.8* 12.3*  --  13.2*  --  9.9  NEUTROABS 3.0  --   --   --   --   --   --   --   HGB 14.4 11.6* 12.1* 9.8* 9.9* 9.9* 10.9* 8.5*  HCT 41.0 32.7* 34.6* 27.6* 29.0* 29.6* 32.0* 25.0*  MCV 94.5 93.2 94.5 93.2  --  99.0  --  98.4  PLT 90* 97* 103* 109*  --  124*  --  96*     Basic Metabolic Panel: Recent Labs  Lab 12/21/19 1606 12/22/19 0729 12/22/19 2339 12/23/19 1002 12/23/19 1252 12/23/19 1600 12/23/19 1740 12/23/19 1945 12/24/19 0501  NA 137 137 142  --  138  --  145 144 139  K 3.3* 4.3 3.5  --  2.8*  --  3.5 3.8 3.4*  CL 111 109 114*  --  112*  --   --   --  109  CO2 17* 16* 15*  --  12*  --   --   --  12*  GLUCOSE 109* 135* 136*  --  129*  --   --   --  228*  BUN 12 14 20   --  25*  --   --   --  31*  CREATININE 1.46* 1.48* 1.99*  --  2.13*  --   --   --  2.89*  CALCIUM 8.0* 8.0* 7.7*  --  6.9*  --   --   --  6.8*  MG 1.8 2.4 2.8* 2.7*  --  2.9*  --   --  2.6*  PHOS 2.2*  --   --  5.0*  --  6.2*  --   --  5.8*   GFR: Estimated Creatinine Clearance: 47.6 mL/min (A) (by C-G formula based on SCr of 2.89 mg/dL (H)). Recent Labs  Lab 12/21/2019 1941 12/21/19 0533 12/22/19 0729 12/22/19 2339 12/23/19 1944 12/23/19 1955 12/23/19 2036 12/24/19 0501  WBC  --  5.1 12.8* 12.3* 13.2*  --   --  9.9  LATICACIDVEN 5.8* 1.5  --   --   --  7.3* 7.4*  --     Liver Function Tests: Recent Labs  Lab 12/21/19 0533 12/21/19 1606 12/22/19 0729 12/22/19 2339 12/24/19 0501  AST 113* 109* 129* 98* 210*  ALT 51* 49* 57* 50* 89*  ALKPHOS 95 93 93 76 61  BILITOT 8.4* 8.0* 7.2* 6.0* 5.3*  PROT 9.1* 9.4* 9.4* 8.1 8.4*  ALBUMIN 1.8* 1.9* 1.9* 1.6* 2.7*   Recent Labs  Lab 12-31-2019 1947  LIPASE 39   Recent Labs  Lab 2019/12/31 1947 12/22/19 2006  AMMONIA 154* <9*    ABG    Component Value Date/Time   PHART 7.167 (LL) 12/23/2019 1945   PCO2ART 25.8 (L) 12/23/2019 1945   PO2ART 126.0 (H) 12/23/2019 1945   HCO3 9.4 (L) 12/23/2019 1945   TCO2 10 (L) 12/23/2019 1945   ACIDBASEDEF 18.0 (H) 12/23/2019 1945   O2SAT 98.0 12/23/2019 1945     Coagulation Profile: Recent Labs  Lab December 31, 2019 1947 12/21/19 0533 12/21/19 1606  INR 1.5* 1.5* 1.7*    Cardiac Enzymes: Recent Labs  Lab 12/21/19 0533  CKTOTAL 191    HbA1C: No results found  for: HGBA1C  CBG: Recent Labs  Lab 12/23/19 1137 12/23/19 1535 12/23/19 1946 12/23/19 2333 12/24/19 0331  GLUCAP 134* 116* 115* 149* 225*    Review of Systems:   Cannot assess: obtunded  Past Medical History  He,  has a past medical history of Aggressive behavior (06/02/2019), AIDS due to HIV-I [facility]) (infectious disease--- dr Zenaida [place]), AKI (acute kidney injury) (HCC) (06/19/2019), Alcoholic hepatitis (01/14/2019), Anxiety, 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   cryptococcal cerebral infection), Bipolar 1 disorder (HCC), Chronic hepatitis C with hepatic coma (HCC), Chronic viral hepatitis B without coma and with delta agent (HCC), Cirrhosis of liver with ascites (HCC), Cognitive communication deficit, 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), Depression, 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   Surgical History    Past Surgical History:  Procedure Laterality Date  . CYSTOSCOPY N/A 10/10/2017   Procedure: CYSTOSCOPY;  Surgeon: Sebastian Ache, MD;  [facility];  Service: Urology;  Laterality: N/A;  . EXAMINATION UNDER ANESTHESIA N/A 12/28/2014   Procedure: EXAM UNDER ANESTHESIA;  Surgeon: Karie Soda, MD;  Location: [facility];  Service: General;  Laterality: N/A;  . FLOOR OF MOUTH BIOPSY N/A 09/16/2016   Procedure: BIOPSY OF ORAL ABCESS;  Surgeon: Newman Pies,  MD;  Location: [facility];  Service: ENT;  Laterality: N/A;  . INCISION AND DRAINAGE ABSCESS N/A 09/16/2016   Procedure: INCISION AND DRAINAGE ABSCESS;  Surgeon: Newman Pies, MD;  Location: [facility];  Service: ENT;  Laterality: N/A;  . INCISION AND DRAINAGE PERIRECTAL ABSCESS  09/ 03/ 2011   dr toth  . INCISION AND DRAINAGE PERIRECTAL ABSCESS N/A 12/28/2014   Procedure: IRRIGATION AND DEBRIDEMENT PERIRECTAL ABSCESS;  Surgeon: Karie Soda, MD;  Location: [facility];  Service: General;  Laterality: N/A;  Fistula repair and ablation  . INSERTION OF SUPRAPUBIC CATHETER N/A 10/10/2017   Procedure: INSERTION OF SUPRAPUBIC CATHETER;  Surgeon:  Sebastian Ache, MD;  Location: [facility];  Service: Urology;  Laterality: N/A;  . LAPAROSCOPIC REVISION VENTRICULAR-PERITONEAL (V-P) SHUNT N/A 06/12/2017   Procedure: LAPAROSCOPIC REVISION VENTRICULAR-PERITONEAL (V-P) SHUNT;  Surgeon: Kinsinger, De Blanch, MD;  Location: [facility];  Service: General;  Laterality: N/A;  . SHUNT REMOVAL N/A 01/01/2018   Procedure: REMOVAL OF V-P SHUNT, PLACEMENT OF EVD;  Surgeon: [name], MD;  Location: [facility];  Service: Neurosurgery;  Laterality: N/A;  . TRANSTHORACIC ECHOCARDIOGRAM  06/02/2017   EF 65-70%/  trivial PR  . VENTRICULOPERITONEAL SHUNT N/A 06/12/2017   Procedure: SHUNT INSERTION VENTRICULAR-PERITONEAL;  Surgeon: [name], MD;  Location: [facility];  Service: Neurosurgery;  Laterality: N/A;     Social History   reports that he has been smoking cigarettes. He has a 2.00 pack-year smoking history. He has never used smokeless tobacco. He reports current alcohol use. He reports that he does not use drugs.   Family History   His family history includes Breast cancer in his maternal grandmother; Hypertension in an other family member.   Allergies Allergies  Allergen Reactions  . Acyclovir And Related Itching     Home Medications  Prior to Admission medications   Medication Sig Start Date End Date Taking?  Authorizing Provider  Darunavir-Cobicisctat-Emtricitabine-Tenofovir Alafenamide [facility]) 800-150-200-10 MG TABS Take 1 tablet by mouth daily with breakfast. 03/25/19  Yes [name], Lisette Grinder, MD  diclofenac sodium (VOLTAREN) 1 % GEL Apply 2 g topically 4 (four) times daily as needed (bilateral ankle pain). 07/07/19  Yes Beola Cord, MD  dolutegravir (TIVICAY) 50 MG tablet Take 1 tablet (50 mg total) by mouth daily. 03/25/19  Yes [name], Lisette Grinder, MD  furosemide (LASIX) 20 MG tablet Take 1 tablet (20 mg total) by mouth daily as needed for fluid or edema (leg swellings). Take extra dose of potassium if you take this medication Patient taking differently: Take 20 mg by mouth every Monday, Wednesday, and Friday.  06/08/19 06/07/20 Yes Alessandra Bevels, MD  levETIRAcetam (KEPPRA) 1000 MG tablet Take 1 tablet (1,000 mg total) by mouth 2 (two) times daily. 03/25/19  Yes [name], Lisette Grinder, MD  ondansetron (ZOFRAN) 4 MG tablet Take 1 tablet (4 mg total) by mouth every 6 (six) hours as needed for nausea or vomiting. 08/21/19  Yes Agyei, Hermina Staggers, MD  rifaximin (XIFAXAN) 550 MG TABS tablet Take 1 tablet (550 mg total) by mouth 2 (two) times daily. 06/26/19  Yes Iva Boop, MD  spironolactone (ALDACTONE) 50 MG tablet Take 1 tablet (50 mg total) by mouth 2 (two) times daily. 07/30/19  Yes Dellia Cloud, MD  valACYclovir (VALTREX) 1000 MG tablet Take 1 tablet (1,000 mg total) by mouth every Monday, Wednesday, and Friday. 06/09/19  Yes Alessandra Bevels, MD  fluconazole (DIFLUCAN) 200 MG tablet Take 200 mg by mouth daily.    [provider]  lactulose (CHRONULAC) 10 GM/15ML solution Take 45 mLs (30 g total) by mouth 3 (three) times daily. 07/07/19   Beola Cord, MD  Magnesium Oxide 200 MG TABS Take 1 tablet (200 mg total) by mouth daily. Patient not taking: Reported on 07/04/2019 06/08/19   Alessandra Bevels, MD  nicotine (NICODERM CQ - DOSED IN MG/24 HOURS) 21 mg/24hr patch Place 1 patch (21 mg  total) onto the skin daily. Patient not taking: Reported on 12/21/2019 07/08/19   Beola Cord, MD  QUEtiapine (SEROQUEL) 25 MG tablet Take 1 tablet (25 mg total) by mouth 2 (two) times daily as needed (agitation).  Patient taking differently: Take 25 mg by mouth every Monday, Wednesday, and Friday.  03/25/19   Truman Hayward, MD  sulfamethoxazole-trimethoprim (BACTRIM) 400-80 MG tablet Take 1 tablet by mouth daily. Patient not taking: Reported on 12/21/2019 06/05/19   [place] Callas, NP     Critical care time: 42 minutes not including separately billable procedures

## 2019-12-24 NOTE — Progress Notes (Signed)
Nutrition Follow-up   DOCUMENTATION CODES:   Not applicable  INTERVENTION:   D/C Vital High Protein  Vital 1.5 @ 55 ml/hr (1320 ml/day) via OG tube 30 ml Prostat TID  Provides: 2280 kcal, 134 grams protein, and 1008 ml free water.   NUTRITION DIAGNOSIS:   Inadequate oral intake related to inability to eat as evidenced by NPO status.  GOAL:   Patient will meet greater than or equal to 90% of their needs  MONITOR:   Vent status, Labs, Weight trends, TF tolerance  REASON FOR ASSESSMENT:   Ventilator, Consult Enteral/tube feeding initiation and management  ASSESSMENT:   28 year old male who presented to the ED on 12/26 with headache. 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   Pt discussed during ICU rounds and with RN.  Palliative consult pending  12/28 - status epilepticus requiring emergent intubation 12/29 - s/p LP  Patient is currently intubated on ventilator support MV: 14.1 L/min Temp (24hrs), Avg:97 F (36.1 C), Min:95.3 F (35.2 C), Max:97.6 F (36.4 C) MAP: 72  Medications reviewed and include: folic acid, lactulose, liquid MVI, IV dilantin, thiamine Levo @ 30 mcg  Labs reviewed: K+3.1 (L) - being replaced  16 L positive  TF via OG  Vital High Protein @ 20 ml/hr with Pro-stat 60 ml TID Tube feeding regimen provides 1080 kcal, 132 grams of protein, and 401 ml of H2O.   Diet Order:   Diet Order            Diet NPO time specified  Diet effective now              EDUCATION NEEDS:   No education needs have been identified at this time  Skin:  Skin Assessment: Reviewed RN Assessment  Last BM:  50 ml via rectal tube  Height:   Ht Readings from Last 1 Encounters:  01/18/2020 6\' 1"  (1.854 m)    Weight:   Wt Readings from Last 1 Encounters:  12/24/19 101.3 kg    Ideal Body Weight:  83.6 kg  BMI:  Body mass  index is 29.46 kg/m.  Estimated Nutritional Needs:   Kcal:  2165  Protein:  120-140 grams  Fluid:  >/= 2.0 L   [place], [place], [place] Pager [phone] After Hours Pager

## 2019-12-24 NOTE — Progress Notes (Signed)
Called mother and updated that he may not make it through this. Informed her of palliative care consult. Tentative plan for repeat therapeutic LP in AM titrated to closing pressure 20 cmH2O.  Erskine Emery MD

## 2019-12-24 NOTE — Progress Notes (Signed)
*  PRELIMINARY RESULTS* Echocardiogram 2D Echocardiogram has been performed.  Justin Ball 12/24/2019, 9:42 AM

## 2019-12-24 NOTE — Progress Notes (Signed)
[place] Progress Note Patient Name: Justin Ball DOB: 1991-05-06 MRN: [id number]   Date of Service  12/24/2019  HPI/Events of Note  Pt with multiple loose stools  eICU Interventions  Flexiseal ordered.        Kerry Kass Sue Fernicola 12/24/2019, 3:18 AM

## 2019-12-24 NOTE — Procedures (Addendum)
Patient Name:Justin Ball LKG:[id number] Epilepsy Attending:Zoii Florer Barbra Sarks Referring Physician/Provider:Dr Amie Portland Duration: 12/23/2019 1938 to 12/24/2019 1938  Patient history:28 year old, HIV+presented for persistent headache and had prolonged seizure today. EEG to evaluate for status epilepticus  Level of alertness:comatose  AEDs during EEG study:LEV, PHT, VPA, Permapanel, versed, propofol  Technical aspects: This EEG study was done with scalp electrodes positioned according to the 10-20 International system of electrode placement. Electrical activity was acquired at a sampling rate of 500Hz  and reviewed with a high frequency filter of 70Hz  and a low frequency filter of 1Hz . EEG data were recorded continuously and digitally stored.  DESCRIPTION:EEG initially showed continuous generalized and lateralized right hemisphere 2 to 3 Hz low voltage delta slowing.  At the beginning of the study periodic (at 1 Hz) epileptiform discharges with overriding fast activity were seen in right posterior quadrant.  Gradually, EEG showed intermittent rhythmic 2 to 3 Hz delta slowing in right hemisphere, maximal right frontocentral region, without any evolution.  ABNORMALITY -Periodic epileptiform discharges, right posterior quadrant -Intermittent rhythmic slow, right hemisphere, maximal right frontocentral -Continuous slow, generalized and lateralized right hemisphere  IMPRESSION: This studyshowed  evidence of epileptogenicity in right posterior quadrant.  There is also evidence of cortical dysfunction in the right hemisphere as well as profound diffuse encephalopathy, nonspecific etiology.  No definite seizures were seen during this recording.   Jazzmyn Filion Barbra Sarks

## 2019-12-25 ENCOUNTER — Telehealth: Payer: Self-pay | Admitting: *Deleted

## 2019-12-25 ENCOUNTER — Inpatient Hospital Stay (HOSPITAL_COMMUNITY): Payer: Medicare Other

## 2019-12-25 DIAGNOSIS — G40911 Epilepsy, unspecified, intractable, with status epilepticus: Secondary | ICD-10-CM

## 2019-12-25 DIAGNOSIS — Z7189 Other specified counseling: Secondary | ICD-10-CM

## 2019-12-25 DIAGNOSIS — Z515 Encounter for palliative care: Secondary | ICD-10-CM

## 2019-12-25 LAB — COMPREHENSIVE METABOLIC PANEL
ALT: 113 U/L — ABNORMAL HIGH (ref 0–44)
AST: 245 U/L — ABNORMAL HIGH (ref 15–41)
Albumin: 2.6 g/dL — ABNORMAL LOW (ref 3.5–5.0)
Alkaline Phosphatase: 58 U/L (ref 38–126)
Anion gap: 17 — ABNORMAL HIGH (ref 5–15)
BUN: 48 mg/dL — ABNORMAL HIGH (ref 6–20)
CO2: 17 mmol/L — ABNORMAL LOW (ref 22–32)
Calcium: 6.8 mg/dL — ABNORMAL LOW (ref 8.9–10.3)
Chloride: 105 mmol/L (ref 98–111)
Creatinine, Ser: 3.93 mg/dL — ABNORMAL HIGH (ref 0.61–1.24)
GFR calc Af Amer: 23 mL/min — ABNORMAL LOW (ref >60)
GFR calc non Af Amer: 19 mL/min — ABNORMAL LOW (ref >60)
Glucose, Bld: 237 mg/dL — ABNORMAL HIGH (ref 70–99)
Potassium: 3.9 mmol/L (ref 3.5–5.1)
Sodium: 139 mmol/L (ref 135–145)
Total Bilirubin: 7.8 mg/dL — ABNORMAL HIGH (ref 0.3–1.2)
Total Protein: 7.7 g/dL (ref 6.5–8.1)

## 2019-12-25 LAB — POCT I-STAT 7, (LYTES, BLD GAS, ICA,H+H)
Acid-base deficit: 5 mmol/L — ABNORMAL HIGH (ref 0.0–2.0)
Bicarbonate: 20.7 mmol/L (ref 20.0–28.0)
Calcium, Ion: 0.9 mmol/L — ABNORMAL LOW (ref 1.15–1.40)
HCT: 32 % — ABNORMAL LOW (ref 39.0–52.0)
Hemoglobin: 10.9 g/dL — ABNORMAL LOW (ref 13.0–17.0)
O2 Saturation: 89 %
Patient temperature: 99.2
Potassium: 4.1 mmol/L (ref 3.5–5.1)
Sodium: 143 mmol/L (ref 135–145)
TCO2: 22 mmol/L (ref 22–32)
pCO2 arterial: 41.6 mmHg (ref 32.0–48.0)
pH, Arterial: 7.307 — ABNORMAL LOW (ref 7.350–7.450)
pO2, Arterial: 64 mmHg — ABNORMAL LOW (ref 83.0–108.0)

## 2019-12-25 LAB — CBC
HCT: 23 % — ABNORMAL LOW (ref 39.0–52.0)
Hemoglobin: 8 g/dL — ABNORMAL LOW (ref 13.0–17.0)
MCH: 33.6 pg (ref 26.0–34.0)
MCHC: 34.8 g/dL (ref 30.0–36.0)
MCV: 96.6 fL (ref 80.0–100.0)
Platelets: 77 10*3/uL — ABNORMAL LOW (ref 150–400)
RBC: 2.38 MIL/uL — ABNORMAL LOW (ref 4.22–5.81)
RDW: 15.5 % (ref 11.5–15.5)
WBC: 10.6 10*3/uL — ABNORMAL HIGH (ref 4.0–10.5)
nRBC: 4.6 % — ABNORMAL HIGH (ref 0.0–0.2)

## 2019-12-25 LAB — PATHOLOGIST SMEAR REVIEW

## 2019-12-25 LAB — VALPROIC ACID LEVEL: Valproic Acid Lvl: 63 ug/mL (ref 50.0–100.0)

## 2019-12-25 LAB — TRIGLYCERIDES: Triglycerides: 96 mg/dL (ref <150)

## 2019-12-25 LAB — GLUCOSE, CAPILLARY
Glucose-Capillary: 241 mg/dL — ABNORMAL HIGH (ref 70–99)
Glucose-Capillary: 229 mg/dL — ABNORMAL HIGH (ref 70–99)

## 2019-12-25 LAB — CULTURE, BLOOD (ROUTINE X 2)
Culture: NO GROWTH
Culture: NO GROWTH
Special Requests: ADEQUATE

## 2019-12-25 LAB — MAGNESIUM: Magnesium: 2.3 mg/dL (ref 1.7–2.4)

## 2019-12-25 LAB — DIC (DISSEMINATED INTRAVASCULAR COAGULATION)PANEL
D-Dimer, Quant: 5.76 ug/mL-FEU — ABNORMAL HIGH (ref 0.00–0.50)
Fibrinogen: 99 mg/dL — CL (ref 210–475)
INR: 2.5 — ABNORMAL HIGH (ref 0.8–1.2)
Platelets: 75 10*3/uL — ABNORMAL LOW (ref 150–400)
Prothrombin Time: 26.6 seconds — ABNORMAL HIGH (ref 11.4–15.2)
Smear Review: NONE SEEN
aPTT: 43 seconds — ABNORMAL HIGH (ref 24–36)

## 2019-12-25 MED ORDER — MIDAZOLAM BOLUS VIA INFUSION
4.0000 mg | INTRAVENOUS | Status: DC | PRN
  Filled 2019-12-25: qty 4

## 2019-12-25 MED ORDER — POTASSIUM CHLORIDE 20 MEQ/15ML (10%) PO SOLN: 40.0000 meq | Freq: Every day | ORAL | Status: DC

## 2019-12-25 MED ORDER — CLOBAZAM 10 MG PO TABS
10.0000 mg | ORAL_TABLET | ORAL | Status: AC
  Administered 2019-12-25: 10 mg via ORAL
  Filled 2019-12-25: qty 1

## 2019-12-25 MED ORDER — SODIUM CHLORIDE 0.9 % IV SOLN
750.0000 mg | Freq: Two times a day (BID) | INTRAVENOUS | Status: DC
  Filled 2019-12-25 (×2): qty 7.5

## 2019-12-25 MED ORDER — FENTANYL BOLUS VIA INFUSION
100.0000 ug | INTRAVENOUS | Status: DC | PRN
  Filled 2019-12-25: qty 200

## 2019-12-25 MED ORDER — PROPOFOL 1000 MG/100ML IV EMUL
5.0000 ug/kg/min | INTRAVENOUS | Status: DC
  Administered 2019-12-25: 10 ug/kg/min via INTRAVENOUS

## 2019-12-25 MED ORDER — SODIUM CHLORIDE 0.9 % IV SOLN
750.0000 mg | Freq: Two times a day (BID) | INTRAVENOUS | Status: DC
  Filled 2019-12-25: qty 7.5

## 2019-12-25 MED ORDER — PROPOFOL BOLUS VIA INFUSION
20.0000 mg | Freq: Once | INTRAVENOUS | Status: AC
  Administered 2019-12-25: 09:00:00 20 mg via INTRAVENOUS
  Filled 2019-12-25: qty 20

## 2019-12-25 MED ORDER — GLYCOPYRROLATE 0.2 MG/ML IJ SOLN: 0.4000 mg | INTRAMUSCULAR | Status: DC

## 2019-12-25 MED ORDER — MORPHINE 100MG IN NS 100ML (1MG/ML) PREMIX INFUSION: 5.0000 mg/h | INTRAVENOUS | Status: DC

## 2019-12-25 MED ORDER — LORAZEPAM 2 MG/ML IJ SOLN
INTRAMUSCULAR | Status: AC
  Administered 2019-12-25: 2 mg
  Filled 2019-12-25: qty 1

## 2019-12-25 MED ORDER — VALPROATE SODIUM 500 MG/5ML IV SOLN
500.0000 mg | Freq: Three times a day (TID) | INTRAVENOUS | Status: DC
  Administered 2019-12-25: 500 mg via INTRAVENOUS
  Filled 2019-12-25 (×4): qty 5

## 2019-12-26 LAB — JC VIRUS, PCR CSF: JC Virus PCR, CSF: NEGATIVE

## 2019-12-26 NOTE — Procedures (Addendum)
Patient Name:Justin Ball IPJ:[id number] Epilepsy Attending:Emiliano Welshans Barbra Sarks Referring Physician/Provider:DrAshish Rory Percy Duration:12/24/2019 1939 to 01-Jan-2020 1306  Patient history:29 year old, HIV+presented for persistent headache and had prolonged seizure today. EEG to evaluate for status epilepticus  Level of alertness:comatose  AEDs during EEG study:LEV, PHT,VPA, Permapanel, versed, propofol  Technical aspects: This EEG study was done with scalp electrodes positioned according to the 10-20 International system of electrode placement. Electrical activity was acquired at a sampling rate of 500Hz  and reviewed with a high frequency filter of 70Hz  and a low frequency filter of 1Hz . EEG data were recorded continuously and digitally stored.  DESCRIPTION:EEG initially showed continuous generalized and lateralized right hemisphere 2 to 3 Hz low voltage delta slowing. Periodic (1 Hz) epileptiform discharges with overriding fast activity was also seen in right posterior quadrant.    Around 0600 on 01-01-20, patient was noted to have facial jerking to the left side.  Concomitant EEG showed rhythmic spike and wave in right posterior quadrant. Around 630, EEG started showing left hemispheric periodic lateralized epileptiform discharges at 0.5 Hz.  These periodic lateralized left hemispheric epileptiform discharges ended around 750.  EEG then showed continuous generalized low voltage 2 to 3 Hz delta slowing.  Around 8 AM EEG started showing generalized periodic epileptiform discharges at 0.5 Hz which ended at 0829.  EEG again showed continuous generalized low voltage 2 to 3 Hz delta slowing.  Again around 11 AM, EEG started showing periodic lateralized epileptiform discharges in right hemisphere at 1.5-2Hz  which persisted till end of study  Event button was pressed on January 01, 2020 at 528 for unclear reasons.  Concomitant EEG showed epileptiform discharges in the right posterior  quadrant.   Event button was placed again on 01/01/2020 at 815.  Per RN, patient was noted to have left upper extremity rhythmic jerking which persisted till after 10 AM.  Concomitant EEG showed generalized periodic epileptiform discharges in right hemisphere.  ABNORMALITY -Focal convulsive status epilepticus, right hemisphere -Periodic epileptiform discharges, right hemisphere -Periodic epileptiform discharges, left hemisphere -Periodic epileptiform discharges, generalized -Continuous slow, generalized  IMPRESSION: This studyshowed evidence of focal convulsive status epilepticus arising from right hemisphere.  There is also evidence of generalized epileptogenicity as well as left and right hemisphere independent epileptogenicity.  Additionally there is evidence of profound diffuse encephalopathy, nonspecific to etiology.   Justin Ball Barbra Sarks

## 2019-12-26 NOTE — Progress Notes (Signed)
Chaplain was with the family during end of life.  The chaplain prayed with the family.  Brion Aliment Chaplain Resident For questions concerning this note please contact me by pager [phone]

## 2019-12-26 NOTE — Progress Notes (Signed)
EEG reviewed from [place] to [place]. Occasional sharp waves centered at T6. No electrographic seizures. Low voltage background.   Electronically signed: Dr. Kerney Elbe

## 2019-12-26 NOTE — Progress Notes (Signed)
Wasted 250 mL versed in stericycle container in med room. Verified by Leticia Clas, RN

## 2019-12-26 NOTE — Progress Notes (Addendum)
Reason for consult: Status epilepticus  Subjective: Versed was weaned off last night.  Unfortunately this morning around 0815, patient again started having left hand focal motor seizure which then progressed to left shoulder.  5 mg IV Ativan was given and patient was restarted on propofol drip as well as Versed drip.  However continues to have left upper extremity focal motor seizure.  ROS: Unable to obtain due to poor mental status  Examination  Vital signs in last 24 hours: Temp:  [97.7 F (36.5 C)-99.6 F (37.6 C)] 99.2 F (37.3 C) (12/31 0800) Pulse Rate:  [91-106] 102 (12/31 1000) Resp:  [20-28] 26 (12/31 1000) BP: (104-133)/(44-63) 123/50 (12/31 1000) SpO2:  [81 %-97 %] 88 % (12/31 1057) Arterial Line BP: (88-116)/(50-60) 116/60 (12/31 1000) FiO2 (%):  [40 %-100 %] 100 % (12/31 1057) Weight:  [111.5 kg] 111.5 kg (12/31 0416)  General: lying in bed, not in apparent distress CVS: pulse-normal rate and rhythm RS: breathing comfortably, intubated, O2 sats 87% Extremities: Edematous  Neuro: On propofol and Versed MS: Comatose, does not open eyes, does not track to noxious stimuli CN: Pupils not reactive to light, corneal reflex absent, gag reflex absent, no forced gaze deviation, oculocephalics absent, rest of the cranial nerves unable to assess secondary to intubation Motor: Does not withdraw to noxious stimuli in any extremity, left hand rhythmic twitching which at times progresses to left shoulder consistent with focal motor seizure   Basic Metabolic Panel: Recent Labs  Lab 12/21/19 1606 12/22/19 0729 12/22/19 2339 12/23/19 1002 12/23/19 1252 12/23/19 1600 12/23/19 1945 12/24/19 0501 12/24/19 0959 12/24/19 1724 2020/01/10 0421 01/10/2020 0836  NA 137 137 142  --  138  --  144 139 145  --  139 143  K 3.3* 4.3 3.5  --  2.8*  --  3.8 3.4* 3.1*  --  3.9 4.1  CL 111 109 114*  --  112*  --   --  109  --   --  105  --   CO2 17* 16* 15*  --  12*  --   --  12*  --   --  17*   --   GLUCOSE 109* 135* 136*  --  129*  --   --  228*  --   --  237*  --   BUN 12 14 20   --  25*  --   --  31*  --   --  48*  --   CREATININE 1.46* 1.48* 1.99*  --  2.13*  --   --  2.89*  --   --  3.93*  --   CALCIUM 8.0* 8.0* 7.7*  --  6.9*  --   --  6.8*  --   --  6.8*  --   MG 1.8 2.4 2.8* 2.7*  --  2.9*  --  2.6*  --   --  2.3  --   PHOS 2.2*  --   --  5.0*  --  6.2*  --  5.8*  --  3.4  --   --     CBC: Recent Labs  Lab 11/25/2019 1516 12/22/19 0729 12/22/19 2339 12/23/19 1944 12/23/19 1945 12/24/19 0501 12/24/19 0959 01-10-2020 0421 01-10-20 0830 01/10/2020 0836  WBC 4.7 12.8* 12.3* 13.2*  --  9.9  --  10.6*  --   --   NEUTROABS 3.0  --   --   --   --   --   --   --   --   --  HGB 14.4 12.1* 9.8* 9.9* 10.9* 8.5* 10.9* 8.0*  --  10.9*  HCT 41.0 34.6* 27.6* 29.6* 32.0* 25.0* 32.0* 23.0*  --  32.0*  MCV 94.5 94.5 93.2 99.0  --  98.4  --  96.6  --   --   PLT 90* 103* 109* 124*  --  96*  --  77* 75*  --      Coagulation Studies: Recent Labs    01/24/2020 0830  LABPROT 26.6*  INR 2.5*    Imaging MRI brain without contrast 12/21/2019:Since the study of 05/25/2017, there has been marked increase in the size of the lateral and third ventricles. Compared to the 01/08/2019 study, there is increased ballooning of the atria of the lateral ventricles and slightly increased dilatation of the temporal horns. Otherwise, any progressive dilatation is subtle.    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  However this morning around 815 he started seizing again and now is in focal convulsive status epilepticus. -Unable to  increase Versed and propofol significantly due to patient already being on pressors.  He is also already on 4 antiepileptics and has worsening hepatic and renal function therefore limiting dose increase.   Recommendations -We will have to reduce Keppra to 750 mg twice daily due to worsening renal function, continue Dilantin 100 mg 3 times daily, continue valproic acid 500 mg 3 times daily. -Loaded with perampanel 16 mg yesterday, will continue at 4 mg nightly. -We will start Onfi 10 mg once daily. -Continue Versed and propofol as tolerated -I called and updated patient's grandmother and mother about super refractory status epilepticus.  I also discussed how at this point, I am unable to control his status epilepticus given his significant comorbidities including multiorgan failure.  -Patient appears to be in multiorgan failure.  I am unable to increase his antiepileptic drugs any further given significant hypotension (already on pressors), significant hepatic and renal impairment. -CT head without contrast was ordered to evaluate for cerebral edema, acute abnormality however unable to proceed due to patient unlikely able to tolerate at this point -Family meeting at 11 AM to discuss goals of care. -Management of cryptococcal meningitis per primary team/ID -Continue seizure precautions -As needed IV Versed 5 mg for clinical seizures  CRITICAL CARE Performed by: Charlsie Quest   Total critical care time:10minutes  Critical care time was exclusive of separately billable procedures and treating other patients.  Critical care was necessary to treat or prevent imminent or life-threatening deterioration.  Critical care was time spent personally by me on the following activities: development of treatment plan with patient and/or surrogate as well as nursing, discussions with consultants, evaluation of patient's response to treatment, examination of patient, obtaining history from patient or  surrogate, ordering and performing treatments and interventions, ordering and review of laboratory studies, ordering and review of radiographic studies, pulse oximetry and re-evaluation of patient's condition.

## 2019-12-26 NOTE — Telephone Encounter (Addendum)
Call received from [name](Jamian's mom) giving me a update on [place].  I informed Anderson Malta that I have spoken with Ms. Collie Siad and she declined further home visit and stated she is able to manage Mang's medications by following what I have already done. Anderson Malta stated Anne has been with her and thought Jameer had his medications but he did not.   Anderson Malta stated she would like for Fitzpatrick to be placed in an assisted living facility and may need my help. She stated she has an appt at the hospital (11am) to discuss Janssen's care.  I informed Anderson Malta that I am willing to do whatever I can to assist her. Anderson Malta stated she will give me a call after the meeting with updates.

## 2019-12-26 NOTE — Telephone Encounter (Signed)
Call received from Justin Ball's mom/Justin Ball to let me know that Justin Ball passed away at 1:30pm today. She states she feels he is at peace and even though it is tough, her family is managing. Justin Ball stated she would like to give me a call next week with the details of the arrangements. Condolences given at this time.

## 2019-12-26 NOTE — Progress Notes (Signed)
Home visit made today. I was unable to catch [place] as planned. Called while at the home with a message left. Ms. Collie Siad and I refilled Justin Ball's medications with colored dots placed so she can identify AM from PM. Ms. Collie Siad asked that I give her a call prior to coming and she will let me know if the visit is needed

## 2019-12-26 NOTE — Progress Notes (Signed)
Patient was terminally extubated. Patient passed away on Jan 13, 2020 at 1328 in the presence of his family. Palliative care and Chaplain were present for emotional support.  CDS and Medical Examiner contacted and notified about patient's death. Pt is not a candidate for donor services. Referral number is [id number].

## 2019-12-26 NOTE — Death Summary Note (Signed)
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 Hospital Course: Patent was admitted with worsening gait stability and headaches.  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  Developed refractory status epilepticus shortly into stay requiring induced coma.  LP consistent with recurrent cryptococcal meningitis.  Despite maximal aggressive support including antimicrobials, anti-epileptics, mechanical ventilation, patient continued to progress to multiorgan failure.  Family meeting was held and patient was allowed to pass away in peace with family at bedside.  Erskine Emery MD PCCM

## 2019-12-26 NOTE — Progress Notes (Signed)
Patient extubated per MD order & family request for withdrawal of care.  Ashley Mariner RRT

## 2019-12-26 NOTE — Progress Notes (Signed)
      INFECTIOUS DISEASE ATTENDING ADDENDUM:   Date: 12/14/2019  Patient name: Justin Ball  Medical record number: [id number]  Date of birth: Mar 10, 1991    I came to the patient's bedside at 11 PM and had an extensive conversation with the palliative care team including Vinie Sill, nursing staff the patient's mother and grandmother who I know quite well.  I counseled them that I felt that Uri likely had yet again suffered significant neurological injury with his ongoing seizures and recurrent coccal meningitis with high intracranial pressures, that previously had required neurosurgical intervention with placement of a shunt, his progressive hepatic failure over the past months now worsening renal failure in the context of amphotericin to treat his cryptococcal meningitis and overall quite poor prognosis due to his inability to adhere to his antiretrovirals despite the best efforts of his grandmother in particular to exhorted him to take them.  I counseled that though it grieved me very much to even think of this that the best option at this point in time was to pivot to comfort care and that this was the most compassionate thing that we could do given Christos's quality of life and current predicament in particular.  They agreed to change to comfort care and were calling family members  I spent greater than 40 minutes with the patient including greater than 50% of time in face to face counsel of the patient and family regarding goals of care and the complicated course that he has gone through.   Rhina Brackett Dam 11/30/2019, 1:35 PM

## 2019-12-26 NOTE — Progress Notes (Signed)
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  Patient also noted to have seizure-like activity.  Hospital course complicated on 12/22/2019 by refractory status epilepticus requiring intubation and transfer to the intensive care unit.  History is per chart review as patient is obtunded on ventilator.  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 Since the study of 05/25/2017, there has been marked increase in the size of the lateral and third ventricles. Compared to the 01/08/2019 study, there is increased ballooning of the atria of the lateral ventricles and slightly increased dilatation of the temporal horns. Otherwise, any progressive dilatation is subtle.  12/22/19 CD4 89  Micro Data:  MRSA PCR positive Blood cx NGTD CSF>>> cryptococcal Ag +  Antimicrobials:  12/27 onward Acyclovir Amphotericin Flucytosine Rifaxamin Bactrim ppx  Interim history/subjective:  Remains on pressors, oliguric. O2 requirements increased overnight, CXR pending.  Objective   Blood pressure (!) 114/50, pulse (!) 102, temperature 99.6 F (37.6 C), temperature source Axillary, resp. rate (!) 24, height  (1.854 m), weight 111.5 kg, SpO2 95 %.    Vent Mode: PRVC FiO2 (%):  [40 %-60 %] 60 % Set Rate:  [22 bmp] 22 bmp Vt Set:  [640 mL] 640 mL PEEP:  [5 cmH20] 5 cmH20 Plateau Pressure:  [22 cmH20-30 cmH20] 30 cmH20   Intake/Output Summary (Last 24 hours) at 11/28/2019 4098 Last data filed at 12/04/2019 0700 Gross per 24 hour  Intake 8349.36 ml  Output 570 ml  Net 7779.36 ml   Filed Weights   12/23/19 0500 12/24/19 0500 11/28/2019 0416  Weight: 92.1 kg 101.3 kg 111.5 kg    Examination: General: Obtunded young man on ventilator HENT: Endotracheal tube in place minimal secretions Lungs: Lung sounds are diminished at bases and present bilaterally, no accessory muscle use Cardiovascular: Heart sounds regular, extremities warm Abdomen: Soft, +BS Extremities: No edema Neuro: GCS3 GU: foley in place  TG improved today Bicarb a bit better CXR and ABG pending LFTs worsening Plts down LE  dopplers neg Echo hyperdynamic, pericardial effusion  Resolved Hospital Problem  list   N/A  Assessment & Plan:  # Status epilepticus in setting of recurrent cryptococcal meningitis with hydrocephalus # Developing multiorgan failure with liver/coag/heart/lung/CNS/kidney/GI involvement; combination AIDS, opportunistic infection, medication effects  # AIDS/HIV noncompliant with ART # Combination of baseline HIV dementia, hepatic encephalopathy, and vascular dementia # Cirrhosis from hepatitis C, hepatitis B, alcohol abuse  - AEDs per neurology, looks like he's seizing again this AM, giving ativan bolus, await neuro input - Fentanyl drip - titrate pressors to maintain MAP > 65 - Continue stress steroids for now - Continue bicarb drip, check ABG - Antimicrobials per ID, appreciate help - Will need some way of offloading CSF whether it be another shunt or intermittent LPs - Getting worse, approaching end of life, family meeting today at 11AM, I will try to be present if able  Best practice:  Diet: Start TF Pain/Anxiety/Delirium protocol (if indicated): See above VAP protocol (if indicated): In place DVT prophylaxis: Lovenox to resume today GI prophylaxis: pepcid Glucose control: SSI Mobility: BR Code Status: Full Family Communication: will reach out Disposition: ICU pending vent liberation  Labs   CBC: Recent Labs  Lab January 13, 2020 1516 12/22/19 0729 12/22/19 2339 12/23/19 1944 12/23/19 1945 12/24/19 0501 12/24/19 0959 12/03/2019 0421  WBC 4.7 12.8* 12.3* 13.2*  --  9.9  --  10.6*  NEUTROABS 3.0  --   --   --   --   --   --   --   HGB 14.4 12.1* 9.8* 9.9* 10.9* 8.5* 10.9* 8.0*  HCT 41.0 34.6* 27.6* 29.6* 32.0* 25.0* 32.0* 23.0*  MCV 94.5 94.5 93.2 99.0  --  98.4  --  96.6  PLT 90* 103* 109* 124*  --  96*  --  77*    Basic Metabolic Panel: Recent Labs  Lab 12/21/19 1606 12/22/19 0729 12/22/19 2339 12/23/19 1002 12/23/19 1252 12/23/19 1600 12/23/19 1740 12/23/19 1945  12/24/19 0501 12/24/19 0959 12/24/19 1724 12/22/2019 0421  NA 137 137 142  --  138  --  145 144 139 145  --  139  K 3.3* 4.3 3.5  --  2.8*  --  3.5 3.8 3.4* 3.1*  --  3.9  CL 111 109 114*  --  112*  --   --   --  109  --   --  105  CO2 17* 16* 15*  --  12*  --   --   --  12*  --   --  17*  GLUCOSE 109* 135* 136*  --  129*  --   --   --  228*  --   --  237*  BUN 12 14 20   --  25*  --   --   --  31*  --   --  48*  CREATININE 1.46* 1.48* 1.99*  --  2.13*  --   --   --  2.89*  --   --  3.93*  CALCIUM 8.0* 8.0* 7.7*  --  6.9*  --   --   --  6.8*  --   --  6.8*  MG 1.8 2.4 2.8* 2.7*  --  2.9*  --   --  2.6*  --   --  2.3  PHOS 2.2*  --   --  5.0*  --  6.2*  --   --  5.8*  --  3.4  --    GFR: Estimated Creatinine Clearance: 36.6 mL/min (A) (by C-G formula based on SCr of 3.93 mg/dL (  H)). Recent Labs  Lab 12/22/19 2339 12/23/19 1944 12/23/19 1955 12/23/19 2036 12/24/19 0501 12/24/19 0951 12/24/19 1147 12/24/2019 0421  WBC 12.3* 13.2*  --   --  9.9  --   --  10.6*  LATICACIDVEN  --   --  7.3* 7.4*  --  7.1* 6.7*  --     Liver Function Tests: Recent Labs  Lab 12/21/19 1606 12/22/19 0729 12/22/19 2339 12/24/19 0501 12/06/2019 0421  AST 109* 129* 98* 210* 245*  ALT 49* 57* 50* 89* 113*  ALKPHOS 93 93 76 61 58  BILITOT 8.0* 7.2* 6.0* 5.3* 7.8*  PROT 9.4* 9.4* 8.1 8.4* 7.7  ALBUMIN 1.9* 1.9* 1.6* 2.7* 2.6*   Recent Labs  Lab Dec 26, 2019 1947  LIPASE 39   Recent Labs  Lab 12/26/19 1947 12/22/19 2006  AMMONIA 154* <9*    ABG    Component Value Date/Time   PHART 7.393 12/24/2019 0959   PCO2ART 24.1 (L) 12/24/2019 0959   PO2ART 108.0 12/24/2019 0959   HCO3 14.8 (L) 12/24/2019 0959   TCO2 16 (L) 12/24/2019 0959   ACIDBASEDEF 9.0 (H) 12/24/2019 0959   O2SAT 98.0 12/24/2019 0959     Coagulation Profile: Recent Labs  Lab 12/26/19 1947 12/21/19 0533 12/21/19 1606  INR 1.5* 1.5* 1.7*    Cardiac Enzymes: Recent Labs  Lab 12/21/19 0533 12/24/19 0951  CKTOTAL 191 87      HbA1C: No results found for: HGBA1C  CBG: Recent Labs  Lab 12/24/19 1517 12/24/19 1934 12/24/19 2333 12/06/2019 0315 11/28/2019 0800  GLUCAP 161* 141* 189* 241* 229*    Review of Systems:   Cannot assess: obtunded  Past Medical History  He,  has a past medical history of Aggressive behavior (06/02/2019), AIDS due to HIV-I [facility]) (infectious disease--- dr Zenaida [place]), AKI (acute kidney injury) (HCC) (06/19/2019), Alcoholic hepatitis (01/14/2019), Anxiety, 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   cryptococcal cerebral infection), Bipolar 1 disorder (HCC), Chronic hepatitis C with hepatic coma (HCC), Chronic viral hepatitis B without coma and with delta agent (HCC), Cirrhosis of liver with ascites (HCC), Cognitive communication deficit, 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), Depression, 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   Surgical History    Past Surgical History:  Procedure Laterality Date  . CYSTOSCOPY N/A 10/10/2017   Procedure: CYSTOSCOPY;  Surgeon: Sebastian Ache, MD;  Location: [facility];  Service: Urology;  Laterality: N/A;  . EXAMINATION UNDER ANESTHESIA N/A 12/28/2014   Procedure: EXAM UNDER ANESTHESIA;  Surgeon: Karie Soda, MD;  Location: [facility];  Service: General;  Laterality: N/A;  . FLOOR OF MOUTH BIOPSY N/A 09/16/2016   Procedure: BIOPSY OF  ORAL ABCESS;  Surgeon: Newman Pies, MD;  Location: [facility];  Service: ENT;  Laterality: N/A;  . INCISION AND DRAINAGE ABSCESS N/A 09/16/2016   Procedure: INCISION AND DRAINAGE ABSCESS;  Surgeon: Newman Pies, MD;  Location: [facility];  Service: ENT;  Laterality: N/A;  . INCISION AND DRAINAGE PERIRECTAL ABSCESS  09/ 03/ 2011   dr toth  . INCISION AND DRAINAGE PERIRECTAL ABSCESS N/A 12/28/2014   Procedure: IRRIGATION AND  DEBRIDEMENT PERIRECTAL ABSCESS;  Surgeon: Karie SodaSteven Gross, MD;  Location: [facility];  Service: General;  Laterality: N/A;  Fistula repair and ablation  . INSERTION OF SUPRAPUBIC CATHETER N/A 10/10/2017   Procedure: INSERTION OF SUPRAPUBIC CATHETER;  Surgeon: Sebastian AcheManny, Theodore, MD;  [facility];  Service: Urology;  Laterality: N/A;  . LAPAROSCOPIC REVISION VENTRICULAR-PERITONEAL (V-P) SHUNT N/A 06/12/2017   Procedure: LAPAROSCOPIC REVISION VENTRICULAR-PERITONEAL (V-P) SHUNT;  Surgeon: Kinsinger, De BlanchLuke Aaron, MD;  Location: [facility];  Service: General;  Laterality: N/A;  . SHUNT REMOVAL N/A 01/01/2018   Procedure: REMOVAL OF V-P SHUNT, PLACEMENT OF EVD;  Surgeon: Lisbeth RenshawNundkumar, Neelesh, MD;  Location: [facility];  Service: Neurosurgery;  Laterality: N/A;  . TRANSTHORACIC ECHOCARDIOGRAM  06/02/2017   EF 65-70%/  trivial PR  . VENTRICULOPERITONEAL SHUNT N/A 06/12/2017   Procedure: SHUNT INSERTION VENTRICULAR-PERITONEAL;  Surgeon: Lisbeth RenshawNundkumar, Neelesh, MD;  Location: [facility];  Service: Neurosurgery;  Laterality: N/A;     Social History   reports that he has been smoking cigarettes. He has a 2.00 pack-year smoking history. He has never used smokeless tobacco. He reports current alcohol use. He reports that he does not use drugs.   Family History   His family history includes Breast cancer in his maternal grandmother; Hypertension in an other family member.   Allergies Allergies  Allergen Reactions  . Acyclovir And Related Itching     Home Medications  Prior to Admission medications   Medication  Sig Start Date End Date Taking? Authorizing Provider  Darunavir-Cobicisctat-Emtricitabine-Tenofovir Alafenamide [facility] (SYMTUZA) 800-150-200-10 MG TABS Take 1 tablet by mouth daily with breakfast. 03/25/19  Yes Daiva EvesVan Dam, Lisette Grinderornelius N, MD  diclofenac sodium (VOLTAREN) 1 % GEL Apply 2 g topically 4 (four) times daily as needed (bilateral ankle pain). 07/07/19  Yes Beola CordMelvin, Alexander, MD  dolutegravir (TIVICAY) 50 MG tablet Take 1 tablet (50 mg total) by mouth daily. 03/25/19  Yes Daiva EvesVan Dam, Lisette Grinderornelius N, MD  furosemide (LASIX) 20 MG tablet Take 1 tablet (20 mg total) by mouth daily as needed for fluid or edema (leg swellings). Take extra dose of potassium if you take this medication Patient taking differently: Take 20 mg by mouth every Monday, Wednesday, and Friday.  06/08/19 06/07/20 Yes Alessandra BevelsKamineni, Neelima, MD  levETIRAcetam (KEPPRA) 1000 MG tablet Take 1 tablet (1,000 mg total) by mouth 2 (two) times daily. 03/25/19  Yes Daiva EvesVan Dam, Lisette Grinderornelius N, MD  ondansetron (ZOFRAN) 4 MG tablet Take 1 tablet (4 mg total) by mouth every 6 (six) hours as needed for nausea or vomiting. 08/21/19  Yes Agyei, Hermina Staggersbed K, MD  rifaximin (XIFAXAN) 550 MG TABS tablet Take 1 tablet (550 mg total) by mouth 2 (two) times daily. 06/26/19  Yes Iva BoopGessner, Carl E, MD  spironolactone (ALDACTONE) 50 MG tablet Take 1 tablet (50 mg total) by mouth 2 (two) times daily. 07/30/19  Yes Dellia Cloudoe, Benjamin, MD  valACYclovir (VALTREX) 1000 MG tablet Take 1 tablet (1,000 mg total) by mouth every Monday, Wednesday, and Friday. 06/09/19  Yes Alessandra BevelsKamineni, Neelima, MD  fluconazole (DIFLUCAN) 200 MG tablet Take 200 mg by mouth daily.    [provider]  lactulose (CHRONULAC) 10 GM/15ML solution Take 45 mLs (30 g total) by mouth 3 (three) times daily. 07/07/19   Beola CordMelvin, Alexander, MD  Magnesium Oxide 200 MG TABS Take 1 tablet (200 mg total) by mouth daily. Patient not taking: Reported on 07/04/2019 06/08/19   Alessandra BevelsKamineni, Neelima, MD  nicotine (NICODERM CQ - DOSED IN MG/24 HOURS) 21  mg/24hr patch Place 1 patch (  21 mg total) onto the skin daily. Patient not taking: Reported on 12/21/2019 07/08/19   Neva Seat, MD  QUEtiapine (SEROQUEL) 25 MG tablet Take 1 tablet (25 mg total) by mouth 2 (two) times daily as needed (agitation). Patient taking differently: Take 25 mg by mouth every Monday, Wednesday, and Friday.  03/25/19   Truman Hayward, MD  sulfamethoxazole-trimethoprim (BACTRIM) 400-80 MG tablet Take 1 tablet by mouth daily. Patient not taking: Reported on 12/21/2019 06/05/19   [place] Callas, NP     Critical care time: 40 minutes not including separately billable procedures

## 2019-12-26 NOTE — Consult Note (Addendum)
Consultation Note Date:  2019-12-28  Patient Name: TARAN HAYNESWORTH  DOB: 18-Jul-1991  MRN: [id number]  Age / Sex: 29 y.o., male  PCP: Maudie Mercury, MD Referring Physician: Candee Furbish, MD  Reason for Consultation: Establishing goals of care  HPI/Patient Profile: 29 y.o. 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 He has required mechanical intubation secondary to status epilepticus. Overall prognosis is very poor.   Clinical Assessment and Goals of Care: I met today at bedside with grandmother and mother. Dr. Tommy Medal joined our conversation and gently explained to the family Aundra's poor prognosis with severe infection, continuous seizures, and difficulty oxygenating him this morning. Unfortunately his immune system is too weak to use the interventions and resources provided to fight this and our expectation is that he will die from his acute illness. The recommendation to shift to comfort care was made to family. They are appropriately tearful but agree that they do not want Dai to suffer. Clarified DNR status while they call family to come to bedside to tell him goodbye. Emotional support provided.   Returned to bedside when all family gathered for one way compassionate extubation. Medication bolus provided and extubation complete. Korin remained comfortable with no visible seizure activity throughout this process. Education and support provided to family at bedside. I  pronounced time of death at 1328 with asystole and no heart/lung sounds, no respirations, no pulse. Family at bedside at time of death and condolences given. Notified Dr. Tamala Julian and Dr. Tommy Medal.   Primary Decision Maker NEXT OF KIN mother and grandmother make decisions together    SUMMARY OF RECOMMENDATIONS   - Extubated to full comfort care - Time of death 2  Code Status/Advance Care Planning:  DNR   Symptom Management:   Fentanyl, versed, propofol infusions left at time of extubation for comfort and seizure management.   Palliative Prophylaxis:   Frequent Pain Assessment, Oral Care and Turn Reposition  Additional Recommendations (Limitations, Scope, Preferences):  Full Comfort Care  Psycho-social/Spiritual:   Desire for further Chaplaincy support:yes  Additional Recommendations: Grief/Bereavement Support  Prognosis:   Died shortly after extubation.   Discharge Planning: Hospital death     Primary Diagnoses: Present on Admission: . AMS (altered mental status) . Status epilepticus ([place])   I have reviewed the medical record, interviewed the patient and family, and examined the patient. The following aspects are pertinent.  Past Medical History:  Diagnosis Date  . Aggressive behavior 06/02/2019  . 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 AKI (acute kidney injury) ([place]) 06/19/2019  . Alcoholic hepatitis 1/46/0479  . Anxiety   . 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 Bipolar 1 disorder ([place])   . Chronic hepatitis C with hepatic coma ([place])   . Chronic viral hepatitis B without coma and with delta agent ([place])   . Cirrhosis of liver with ascites ([place])   . Cognitive communication deficit    working w/ therapy  . Cryptococcal meningoencephalitis ([place]) 16/60/6301   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  and  near heriation of brain (required VP shunt)--- residual CNS damage  . Depression   . 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 Encephalopathy, hepatic ([place]) 06/26/2019  . Fecal incontinence 11/19/2017  . Foley catheter in place   . 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 History of scabies 2013  . History of syphilis   . 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 Nausea and vomiting 01/14/2019  . 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 Neurogenic bowel    wears depends  . Neuromuscular disorder ([place])   . 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 Seizure disorder [facility])    neurologist-  dr Leta Baptist ([facility])--caused by cryptococcal meningitis/ multiple infarctions--  no seizure since hospitalization May 2018  . Status epilepticus ([place])   . URI (upper respiratory infection) 02/24/2019  . Visuospatial deficit    Social History   Socioeconomic History  . Marital status: Single    Spouse name: Not on file  . Number of children: Not on file  . Years of education: Not on file  . Highest education level: Not on file  Occupational History  . Not on file  Tobacco Use  . Smoking status: Current Every Day Smoker    Packs/day: 0.40    Years: 5.00    Pack years: 2.00    Types: Cigarettes  . Smokeless tobacco: Never Used  Substance and Sexual Activity  . Alcohol use: Yes  . Drug use: No  . Sexual activity: Yes    Partners: Male    Birth control/protection: Condom    Comment: irregular condom use; educated  Other Topics Concern  . Not on file  Social History Narrative   He is he is single, no children listed, he is unemployed and is on [place] and Medicare.   Lives with his grandmother.   Male sexual partners   Alcohol use off and on   Cigarette smoker,  not a vapor, no other drug use reported      Social Determinants of Health   Financial Resource Strain:   . Difficulty of Paying Living Expenses: Not on file  Food Insecurity:   . Worried About Charity fundraiser in the Last Year: Not on file  . Ran Out of Food in the Last Year: Not on file  Transportation Needs:   . Lack of Transportation (Medical): Not on file  . Lack of Transportation (Non-Medical): Not on file  Physical Activity:   . Days of Exercise per Week: Not on file  . Minutes of Exercise per Session: Not on file  Stress:   . Feeling of Stress : Not on file  Social Connections:   . Frequency of Communication with Friends and Family: Not on file  . Frequency of Social Gatherings with Friends and Family: Not on  file  . Attends Religious Services: Not on file  . Active Member of Clubs or Organizations: Not on file  . Attends Archivist Meetings: Not on file  . Marital Status: Not on file   Family History  Problem Relation Age of Onset  . Hypertension Other   . Breast cancer Maternal Grandmother    Scheduled Meds: . chlorhexidine gluconate (MEDLINE KIT)  15 mL Mouth Rinse BID  . Chlorhexidine Gluconate Cloth  6 each Topical Q0600  . enoxaparin (LOVENOX) injection  40 mg Subcutaneous Q24H  . famotidine  20 mg Per Tube Daily  . feeding supplement (PRO-STAT SUGAR FREE 64)  60 mL Per Tube TID  . feeding supplement (VITAL HIGH PROTEIN)  1,000 mL Per Tube Q24H  . folic acid  1 mg Per Tube Daily  . insulin aspart  0-15 Units Subcutaneous Q4H  . lactulose  30 g Per Tube TID  . LORazepam  2 mg Intravenous Once  . LORazepam  2 mg Intravenous Once  . magnesium oxide  200 mg Per Tube Daily  . mouth rinse  15 mL Mouth Rinse 10 times per day  . multivitamin  15 mL Per Tube Daily  . nicotine  21 mg Transdermal Daily  . phenytoin (DILANTIN) IV  100 mg Intravenous Q8H  . potassium chloride  40 mEq Per Tube BID  . rifaximin  550 mg Per Tube BID  . sodium chloride   1,000 mL Intravenous Q24H  . [START ON 01/13/20] sodium chloride  1,000 mL Intravenous Q24H  . sodium chloride flush  10-40 mL Intracatheter Q12H  . spironolactone  50 mg Per NG tube BID  . sulfamethoxazole-trimethoprim  10 mL Per Tube Daily  . thiamine  100 mg Oral Daily   Or  . thiamine  100 mg Intravenous Daily   Continuous Infusions: . sodium chloride    . amphotericin  B  Liposome (AMBISOME) ADULT IV Stopped (12/24/19 0506)  . calcium chloride    . dextrose 0 mL (12/21/19 1220)  . dextrose 0 mL/hr at 12/22/19 1946  . fentaNYL infusion INTRAVENOUS 50 mcg/hr (12/24/19 1100)  . levETIRAcetam Stopped (12/24/19 0521)  . midazolam 18.5 mg/hr (12/24/19 1100)  . norepinephrine (LEVOPHED) Adult infusion 30 mcg/min (12/24/19 1100)  . phenylephrine (NEO-SYNEPHRINE) Adult infusion Stopped (12/23/19 2300)  . propofol (DIPRIVAN) infusion Stopped (12/24/19 0804)  .  sodium bicarbonate (isotonic) infusion in sterile water 150 mL/hr at 12/24/19 1154  . valproate sodium Stopped (12/24/19 0652)   PRN Meds:.Place/Maintain arterial line **AND** sodium chloride, acetaminophen **OR** acetaminophen, acetaminophen, diphenhydrAMINE **OR** diphenhydrAMINE, LORazepam, meperidine (DEMEROL) injection, sodium chloride flush Allergies  Allergen Reactions  . Acyclovir And Related Itching   Review of Systems  Unable to perform ROS: Acuity of condition    Physical Exam  Pronounced time of death 1328  Vital Signs: BP (!) 123/46   Pulse 92   Temp (!) 96.9 F (36.1 C) (Axillary)   Resp (!) 23   Ht '6\' 1"'  (1.854 m)   Wt 101.3 kg   SpO2 97%   BMI 29.46 kg/m  Pain Scale: CPOT   Pain Score: 0-No pain   SpO2: SpO2: 97 % O2 Device:SpO2: 97 % O2 Flow Rate: .   IO: Intake/output summary:   Intake/Output Summary (Last 24 hours) at 12/24/2019 1201 Last data filed at 12/24/2019 1100 Gross per 24 hour  Intake 8792.49 ml  Output 1345 ml  Net 7447.49 ml    LBM: Last BM Date: 12/24/19 Baseline  Weight: Weight: 91.6 kg Most recent weight: Weight: 101.3 kg     Palliative Assessment/Data:     Time In/Out: 1100-1200, 1300-1330 Time Total: 90 min Greater than 50%  of this time was spent counseling and coordinating care related to the above assessment and plan.  Signed by: Vinie Sill, NP Palliative Medicine Team Pager # [phone] (M-F 8a-5p) Team Phone # [phone] (Nights/Weekends)

## 2019-12-26 NOTE — Progress Notes (Signed)
EEG now with somewhat more frequent spikes centered at T6, relative to earlier this AM. Also now with bursts of slow wave activity occurring on average approximately every 3 seconds. No electrographic seizures seen.   Electronically signed: Dr. Kerney Elbe

## 2019-12-26 NOTE — Progress Notes (Signed)
Chaplain visited with the family as the patient is nearing end of life.  The chaplain will follow closely as the staff prepares to withdraw care.  Brion Aliment Chaplain Resident For questions concerning this note please contact me by pager [phone]

## 2019-12-26 DEATH — deceased

## 2019-12-28 LAB — ANAEROBIC CULTURE

## 2019-12-29 LAB — CSF CULTURE W GRAM STAIN

## 2019-12-29 LAB — PATHOLOGIST SMEAR REVIEW

## 2019-12-30 LAB — PHENYTOIN LEVEL, FREE AND TOTAL
Phenytoin, Free: 3 ug/mL — ABNORMAL HIGH (ref 1.0–2.0)
Phenytoin, Total: 10 ug/mL (ref 10.0–20.0)

## 2019-12-31 ENCOUNTER — Encounter: Payer: Medicaid Other | Admitting: Infectious Disease

## 2020-01-17 LAB — CULTURE, FUNGUS WITHOUT SMEAR
